# Patient Record
Sex: Female | Born: 1955 | Race: Black or African American | Hispanic: No | Marital: Married | Smoking: Current some day smoker
Health system: Southern US, Community
[De-identification: ages and names within clinical notes are randomized; demographics above are authoritative.]

## PROBLEM LIST (undated history)

## (undated) DIAGNOSIS — C539 Malignant neoplasm of cervix uteri, unspecified: Secondary | ICD-10-CM

## (undated) DIAGNOSIS — I214 Non-ST elevation (NSTEMI) myocardial infarction: Secondary | ICD-10-CM

## (undated) DIAGNOSIS — K922 Gastrointestinal hemorrhage, unspecified: Secondary | ICD-10-CM

## (undated) DIAGNOSIS — I509 Heart failure, unspecified: Secondary | ICD-10-CM

## (undated) DIAGNOSIS — E119 Type 2 diabetes mellitus without complications: Secondary | ICD-10-CM

## (undated) DIAGNOSIS — K259 Gastric ulcer, unspecified as acute or chronic, without hemorrhage or perforation: Secondary | ICD-10-CM

## (undated) DIAGNOSIS — D649 Anemia, unspecified: Secondary | ICD-10-CM

## (undated) DIAGNOSIS — J449 Chronic obstructive pulmonary disease, unspecified: Secondary | ICD-10-CM

## (undated) HISTORY — PX: APPENDECTOMY: SHX54

## (undated) HISTORY — DX: Non-ST elevation (NSTEMI) myocardial infarction: I21.4

## (undated) HISTORY — PX: OTHER SURGICAL HISTORY: SHX169

## (undated) HISTORY — DX: Malignant neoplasm of cervix uteri, unspecified: C53.9

## (undated) HISTORY — PX: CHOLECYSTECTOMY: SHX55

## (undated) HISTORY — PX: CARDIAC CATHETERIZATION: SHX172

---

## 2004-02-29 ENCOUNTER — Emergency Department: Payer: Self-pay | Admitting: General Practice

## 2004-05-11 ENCOUNTER — Emergency Department: Payer: Self-pay | Admitting: Unknown Physician Specialty

## 2004-07-06 ENCOUNTER — Emergency Department: Payer: Self-pay | Admitting: Internal Medicine

## 2004-09-11 ENCOUNTER — Other Ambulatory Visit: Payer: Self-pay

## 2004-09-12 ENCOUNTER — Inpatient Hospital Stay: Payer: Self-pay | Admitting: Infectious Diseases

## 2004-10-08 ENCOUNTER — Inpatient Hospital Stay: Payer: Self-pay | Admitting: Internal Medicine

## 2004-10-08 ENCOUNTER — Other Ambulatory Visit: Payer: Self-pay

## 2004-11-12 ENCOUNTER — Other Ambulatory Visit: Payer: Self-pay

## 2004-11-13 ENCOUNTER — Observation Stay: Payer: Self-pay

## 2004-11-14 ENCOUNTER — Inpatient Hospital Stay: Payer: Self-pay | Admitting: Internal Medicine

## 2004-11-14 ENCOUNTER — Other Ambulatory Visit: Payer: Self-pay

## 2004-11-15 ENCOUNTER — Other Ambulatory Visit: Payer: Self-pay

## 2004-11-20 ENCOUNTER — Emergency Department: Payer: Self-pay | Admitting: Emergency Medicine

## 2004-12-12 ENCOUNTER — Other Ambulatory Visit: Payer: Self-pay

## 2004-12-12 ENCOUNTER — Emergency Department: Payer: Self-pay | Admitting: Emergency Medicine

## 2004-12-23 ENCOUNTER — Other Ambulatory Visit: Payer: Self-pay

## 2004-12-23 ENCOUNTER — Emergency Department: Payer: Self-pay | Admitting: General Practice

## 2005-05-25 ENCOUNTER — Ambulatory Visit: Payer: Self-pay | Admitting: Gastroenterology

## 2005-06-01 ENCOUNTER — Other Ambulatory Visit: Payer: Self-pay

## 2005-06-02 ENCOUNTER — Inpatient Hospital Stay: Payer: Self-pay | Admitting: Internal Medicine

## 2005-06-02 ENCOUNTER — Other Ambulatory Visit: Payer: Self-pay

## 2005-06-03 ENCOUNTER — Other Ambulatory Visit: Payer: Self-pay

## 2005-06-20 ENCOUNTER — Ambulatory Visit: Payer: Self-pay | Admitting: Surgery

## 2005-07-27 ENCOUNTER — Emergency Department: Payer: Self-pay | Admitting: Emergency Medicine

## 2005-10-06 IMAGING — CR DG CHEST 1V PORT
1 series · 1 of 1 positions shown · non-contrast
Comparison: none

REASON FOR EXAM: CP
COMMENTS:

[view not recorded]
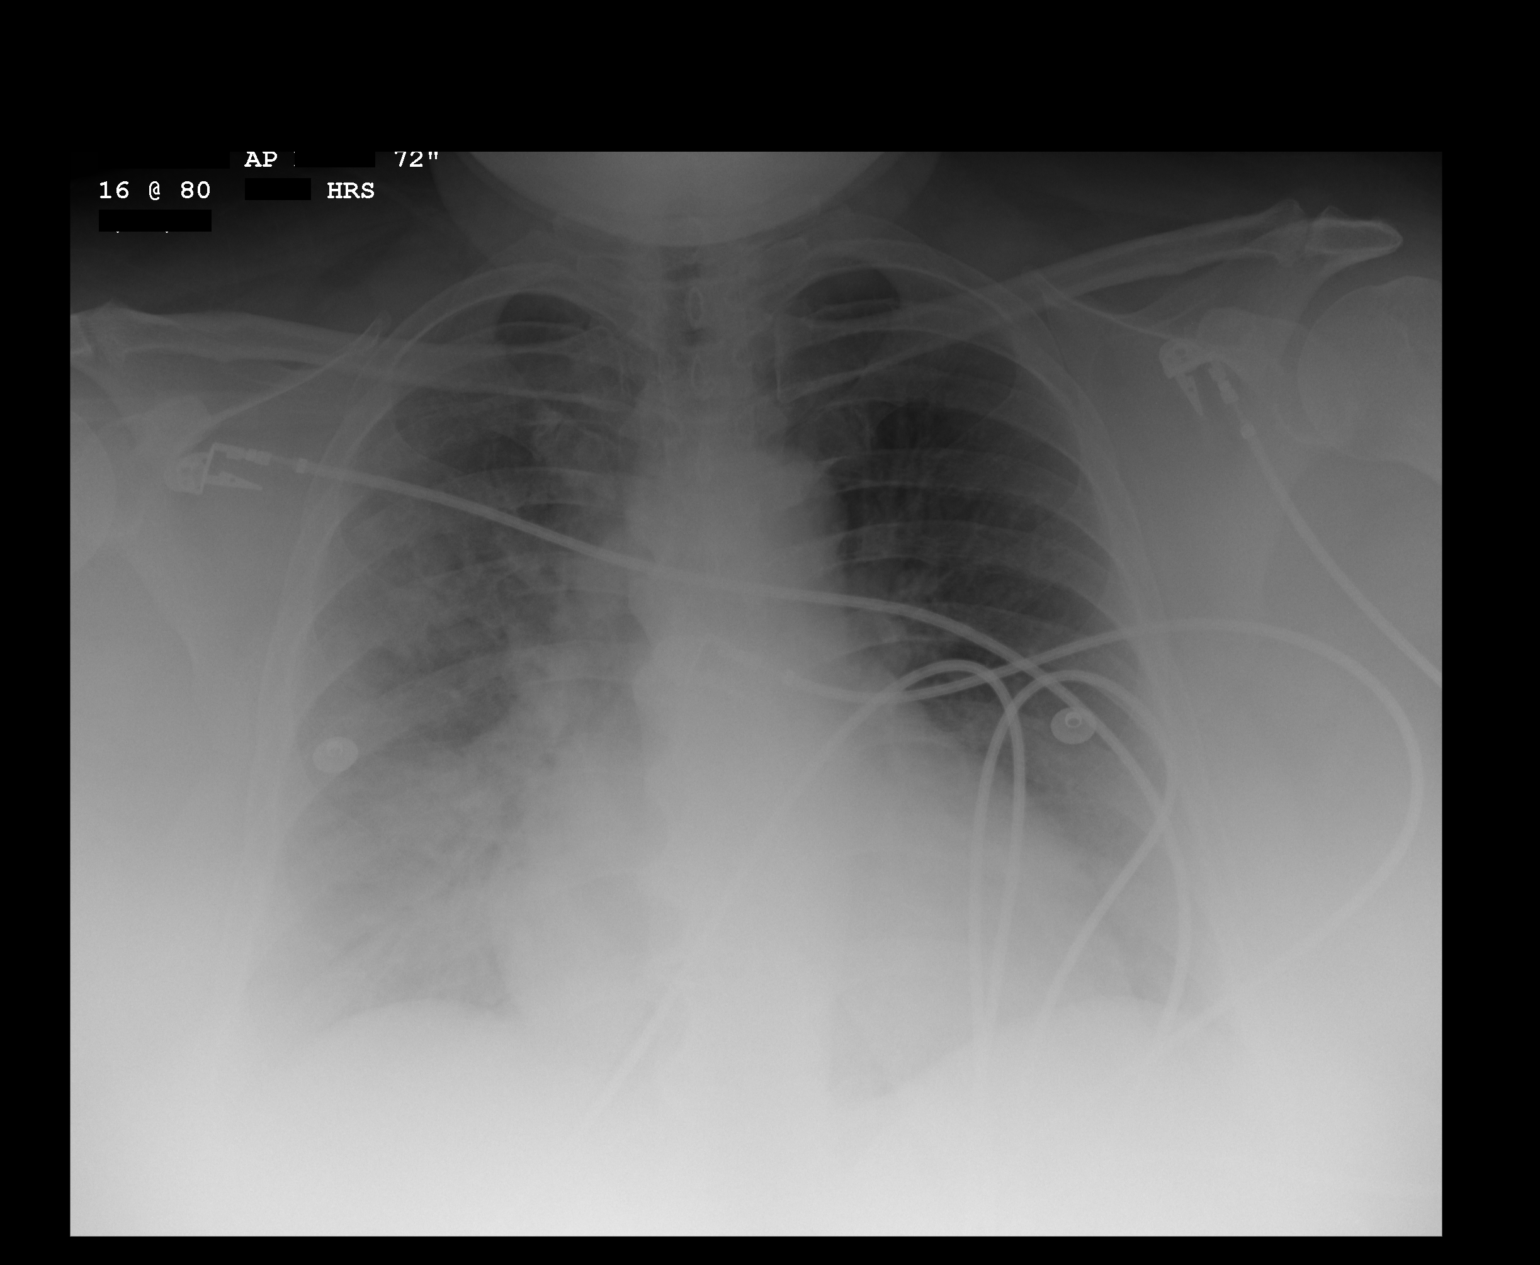

[1 of 1 positions shown; findings below may reference images not displayed]

PROCEDURE:     DXR - DXR PORTABLE CHEST SINGLE VIEW  - September 11, 2004  [DATE]

RESULT:          There are increased interstitial densities noted throughout
much of the RIGHT lung.  The cardiopericardial silhouette is enlarged.  The
LEFT lung is adequately inflated and grossly clear.  Mild prominence of the
central pulmonary vascularity is present.
IMPRESSION: 1.     There are interstitial infiltrates noted within the RIGHT lung
relatively diffusely.
2.     There are findings consistent with low-grade congestive heart
failure.  Enlargement of the cardiac silhouette is present.

## 2005-10-06 IMAGING — CT CT CHEST W/ CM
1 series · 15 of 33 positions shown, 19 images · IV contrast (APPLIED)
Comparison: none

REASON FOR EXAM: Chest pain
COMMENTS:

[Series 5: soft tissue · axial · 0.78mm/px · z∈[-790,-532]mm · 15 of 102 slices shown, 19 images]
[im 8/102  mediastinal]
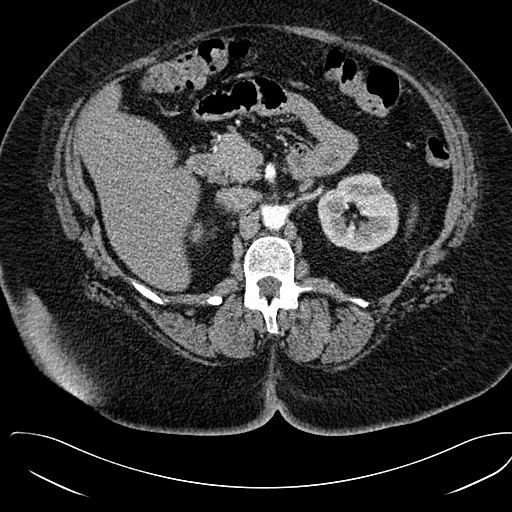
[im 8/102  lung]
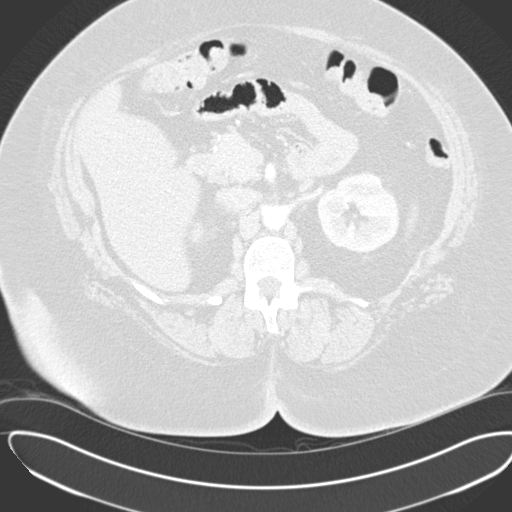
[im 15/102  lung]
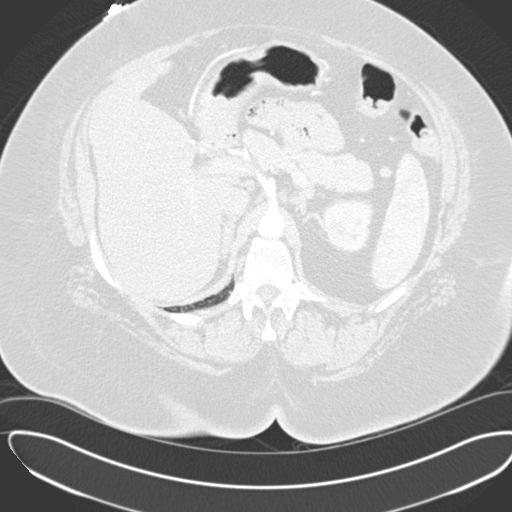
[im 21/102  lung]
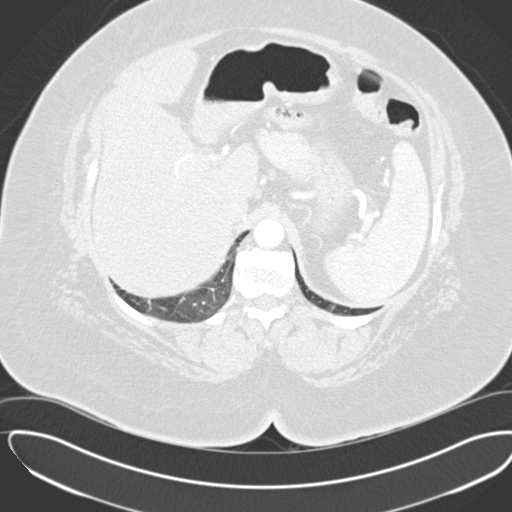
[im 27/102  lung]
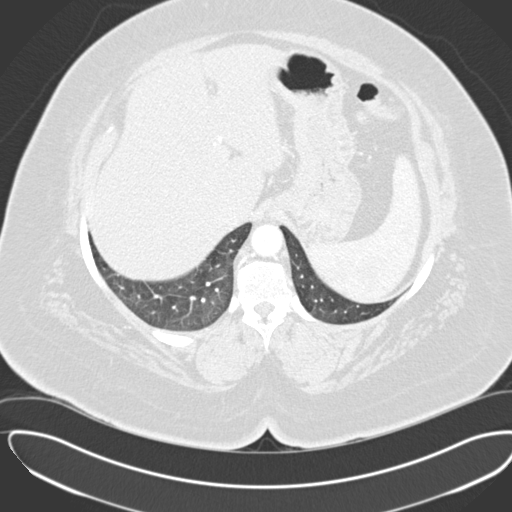
[im 34/102  mediastinal]
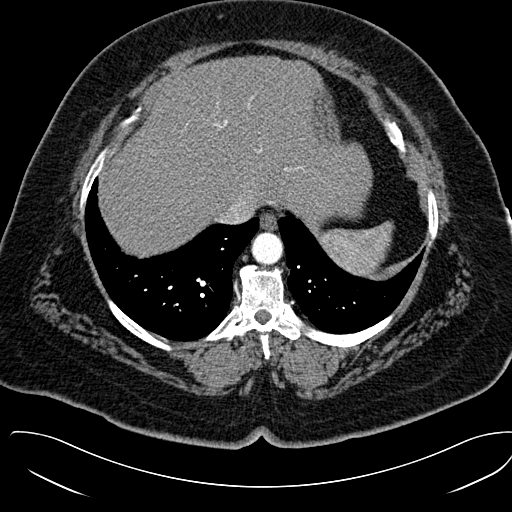
[im 34/102  lung]
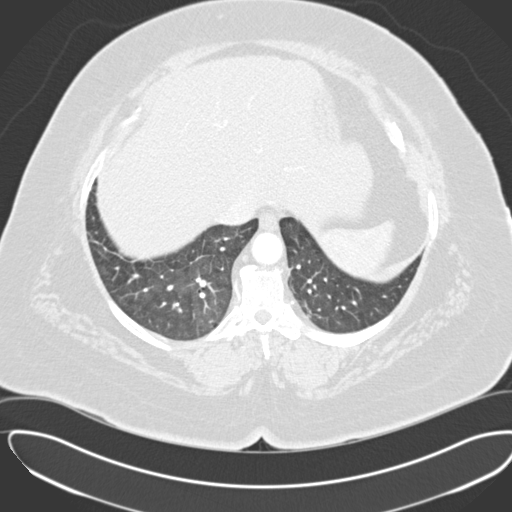
[im 41/102  lung]
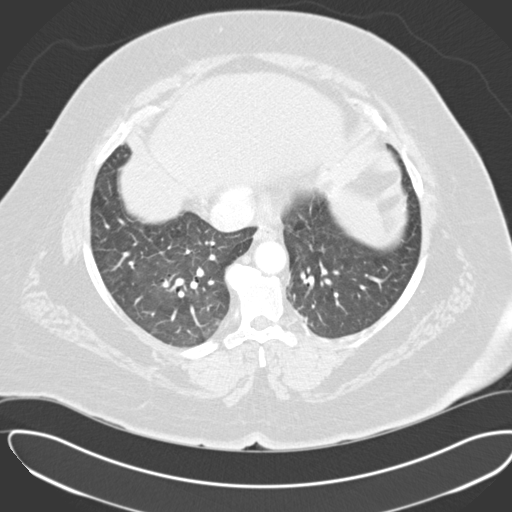
[im 45/102  lung]
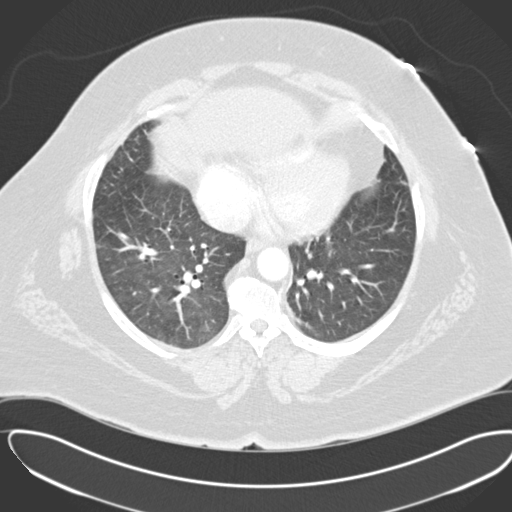
[im 53/102  lung]
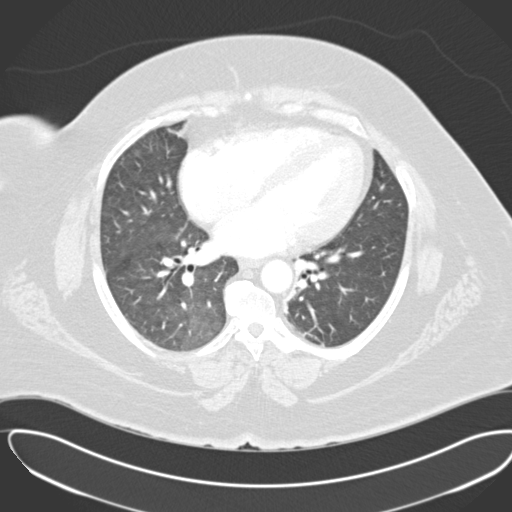
[im 57/102  mediastinal]
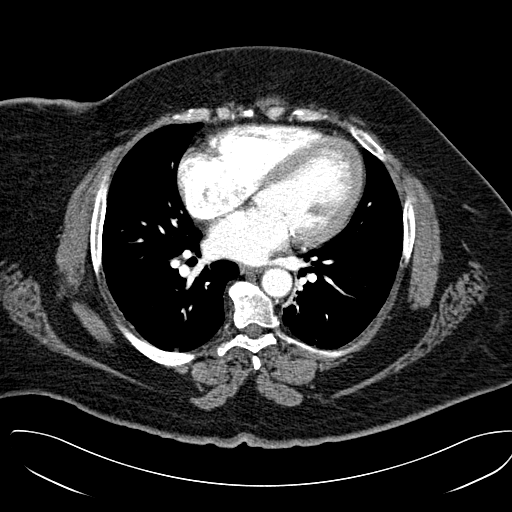
[im 57/102  lung]
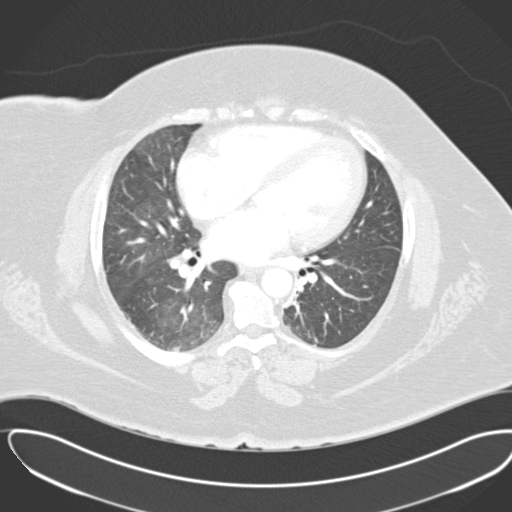
[im 61/102  lung]
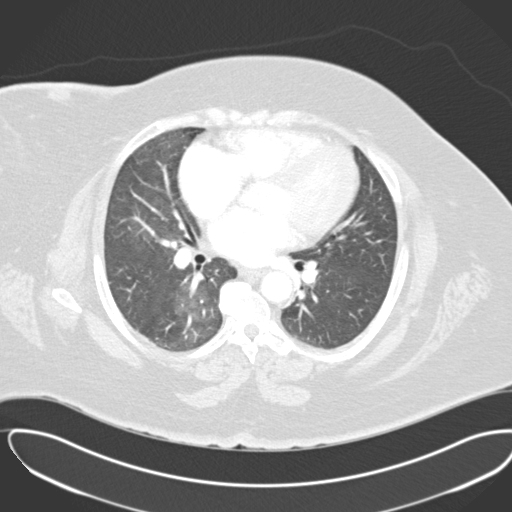
[im 68/102  lung]
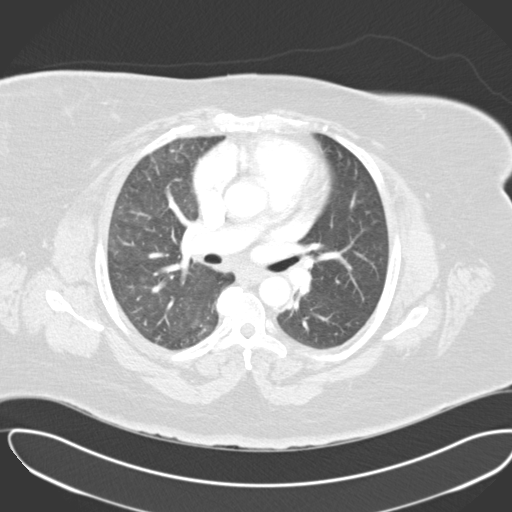
[im 75/102  lung]
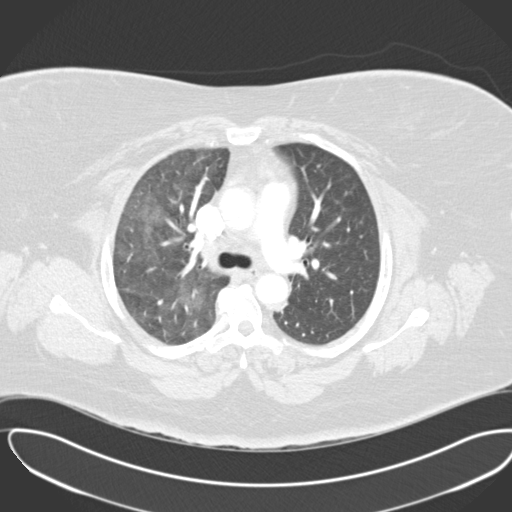
[im 81/102  mediastinal]
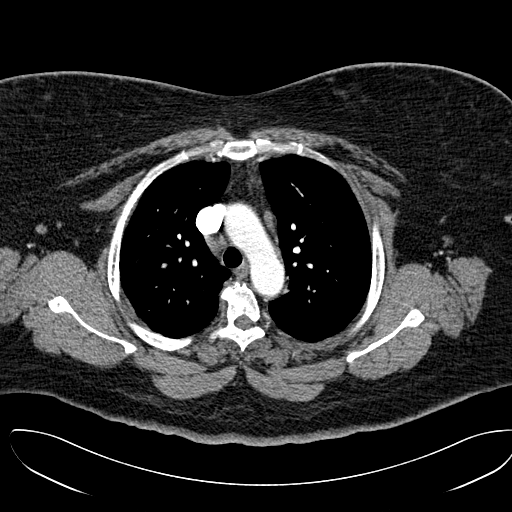
[im 81/102  lung]
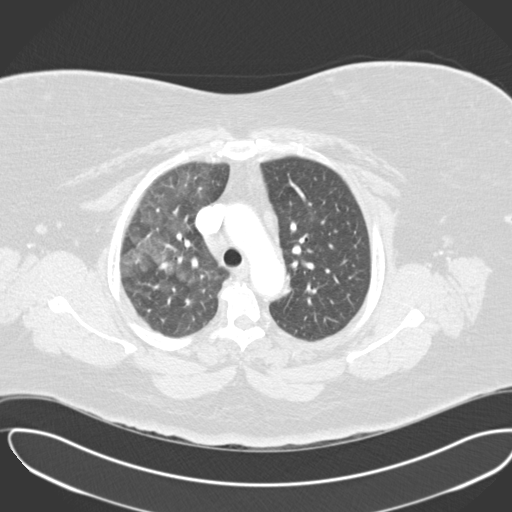
[im 87/102  lung]
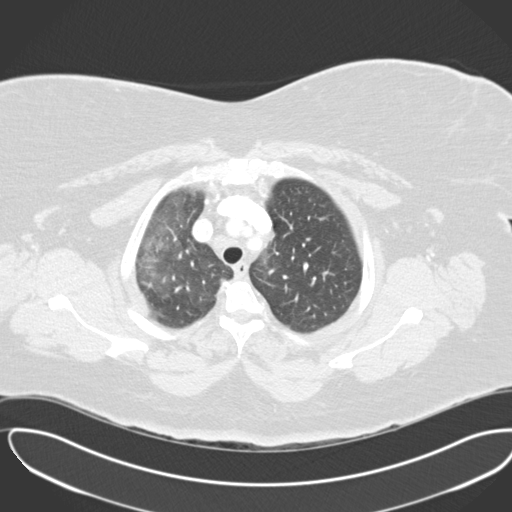
[im 94/102  lung]
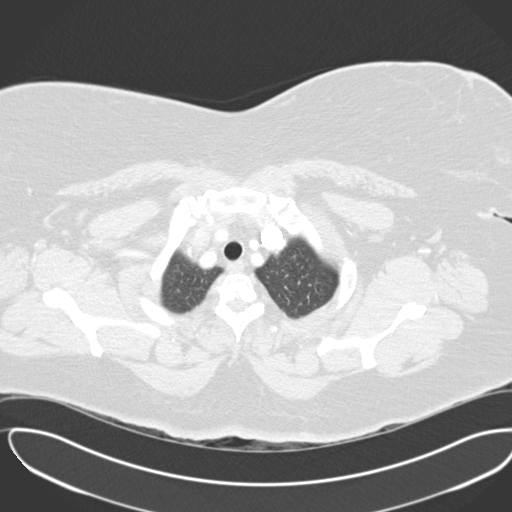

[15 of 33 positions shown; findings below may reference images not displayed]

PROCEDURE:     CT  - CT CHEST (FOR PE) W  - September 11, 2004 [DATE]

RESULT:          The patient has elevated white blood cell count as well as
D-dimer and is having bilateral shoulder pain and dyspnea.  Comparison is
made to a chest x-ray 11 September, 2004.  The patient received 100 cc of
Psovue-MME for this study.

The caliber of the thoracic aorta is normal.  Contrast within the pulmonary
arterial tree is also normal.  I see no pleural nor pericardial effusion.
The cardiac chambers are mildly enlarged.  No pathologic-size mediastinal or
hilar lymph nodes are seen.  At lung window settings, increased interstitial
density in a ground glass pattern is seen in the RIGHT upper lobe as well as
in the RIGHT lower lobe superiorly.  The RIGHT middle lobe appears only
minimally involved.  I do not see similar ground glass appearance in the
LEFT lung.  No abnormal parenchymal nodules are seen within either lung.

Within the upper abdomen, the observed portions of the liver are normal.
The spleen is not enlarged.
IMPRESSION: 1.     I do not see evidence of an acute pulmonary embolism.
2.     There is mild enlargement of the cardiac chambers.  There is no
pleural or pericardial effusion.  The caliber of the thoracic aorta is
normal.
3.     There is mildly increased interstitial density throughout much of the
RIGHT lung.  This may reflect developing interstitial type pneumonia.
Followup chest x-rays are recommended.

A preliminary report was sent to the emergency room at the conclusion of the
study.

## 2005-10-14 ENCOUNTER — Observation Stay: Payer: Self-pay | Admitting: Internal Medicine

## 2005-10-14 ENCOUNTER — Other Ambulatory Visit: Payer: Self-pay

## 2005-10-18 ENCOUNTER — Inpatient Hospital Stay: Payer: Self-pay | Admitting: Internal Medicine

## 2005-10-18 ENCOUNTER — Other Ambulatory Visit: Payer: Self-pay

## 2005-11-02 IMAGING — CR DG CHEST 1V PORT
1 series · 1 of 1 positions shown · non-contrast
Comparison: none

REASON FOR EXAM: chest pain       rm 2
COMMENTS:

[view not recorded]
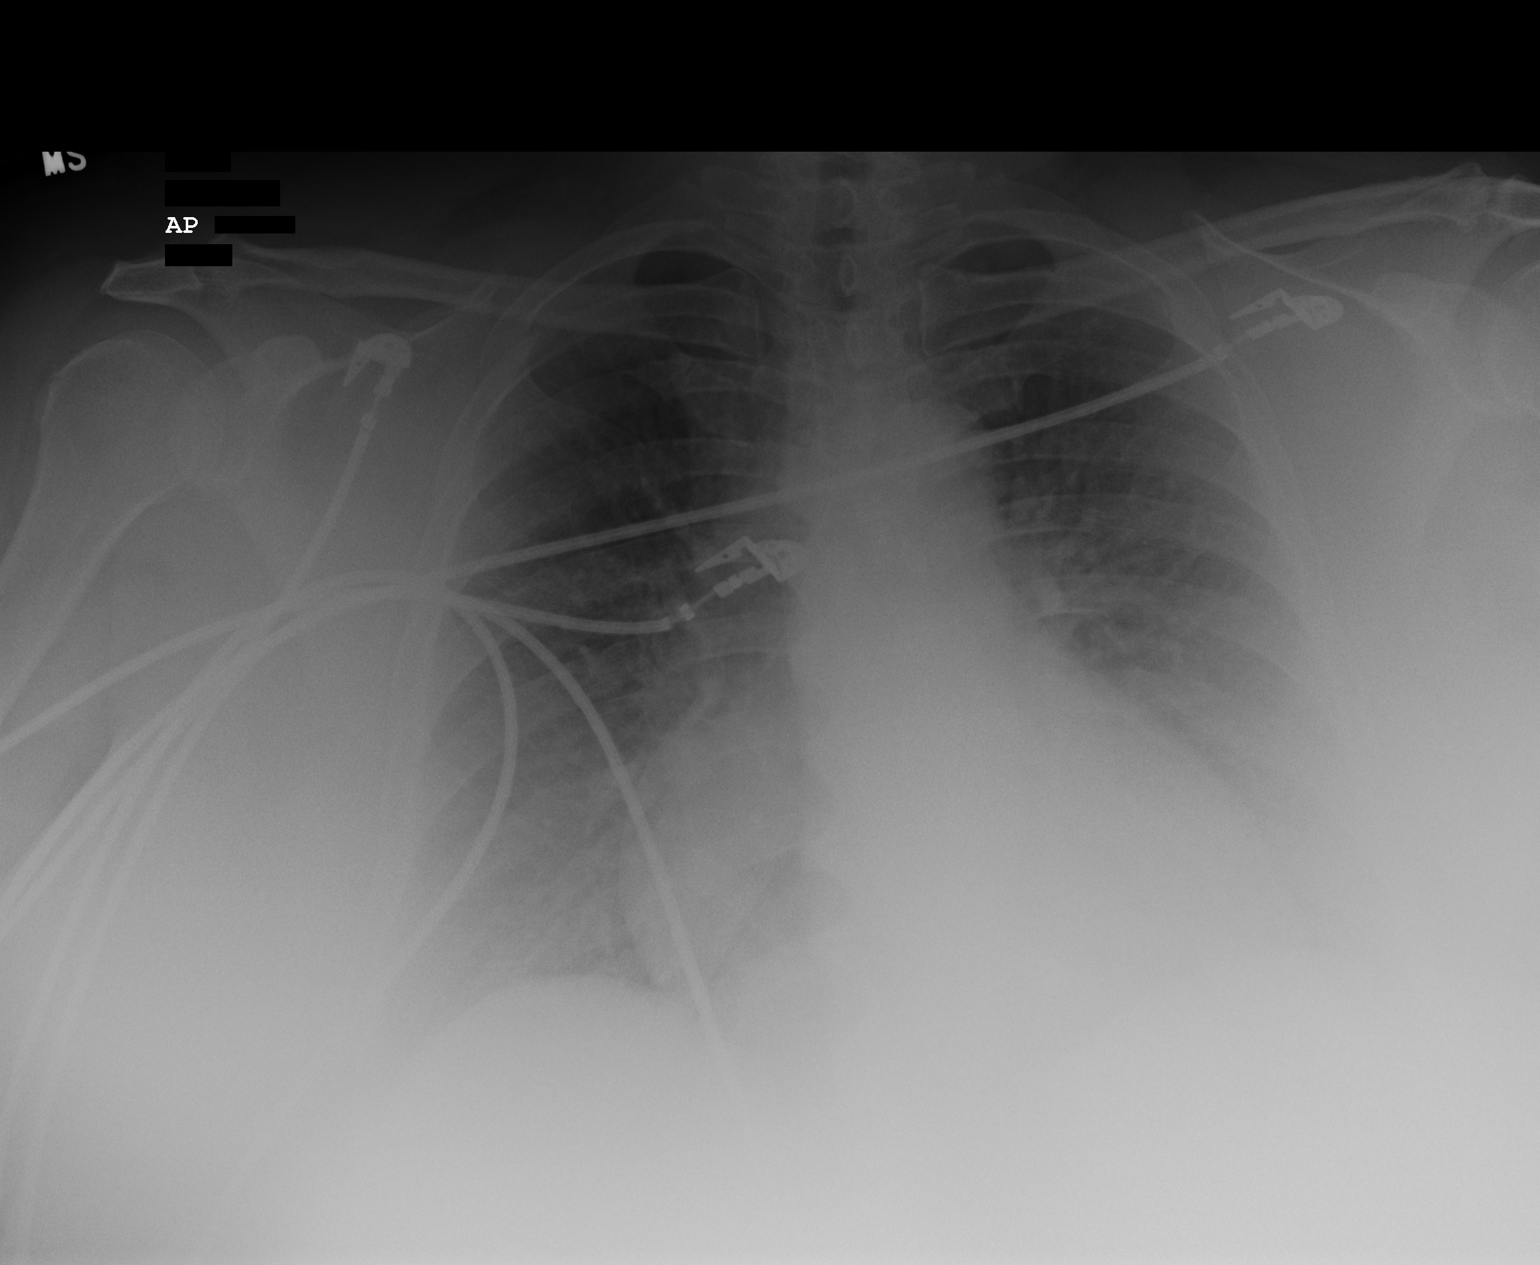

[1 of 1 positions shown; findings below may reference images not displayed]

PROCEDURE:     DXR - DXR PORTABLE CHEST SINGLE VIEW  - October 08, 2004  [DATE]

RESULT:     AP view of the chest is compared to the prior exam of 09-11-04.
The current exam shows slight prominence of the pulmonary vascularity
compatible with pulmonary vascular congestion. No definite interstitial or
pulmonary edema is seen radiographically at this time. The changes of
congestive heart failure evident on the exam of 09-11-04 are now less
prominent.  The heart is mildly enlarged, but stable as compared to the
prior exam. No pneumonia is seen. Monitoring electrodes are present.
IMPRESSION: 1)There are changes of pulmonary vascular congestion consistent with early
manifestation of congestive heart failure.  The findings of CHF are less
prominent than those observed on the prior exam of 09-11-04.

[DATE])No pneumonia is seen.

## 2005-11-08 ENCOUNTER — Other Ambulatory Visit: Payer: Self-pay

## 2005-11-08 ENCOUNTER — Emergency Department: Payer: Self-pay | Admitting: General Practice

## 2005-12-07 IMAGING — CR DG CHEST 1V PORT
1 series · 1 of 1 positions shown · non-contrast
Comparison: none

REASON FOR EXAM: Shortness of breath
COMMENTS:

[view not recorded]
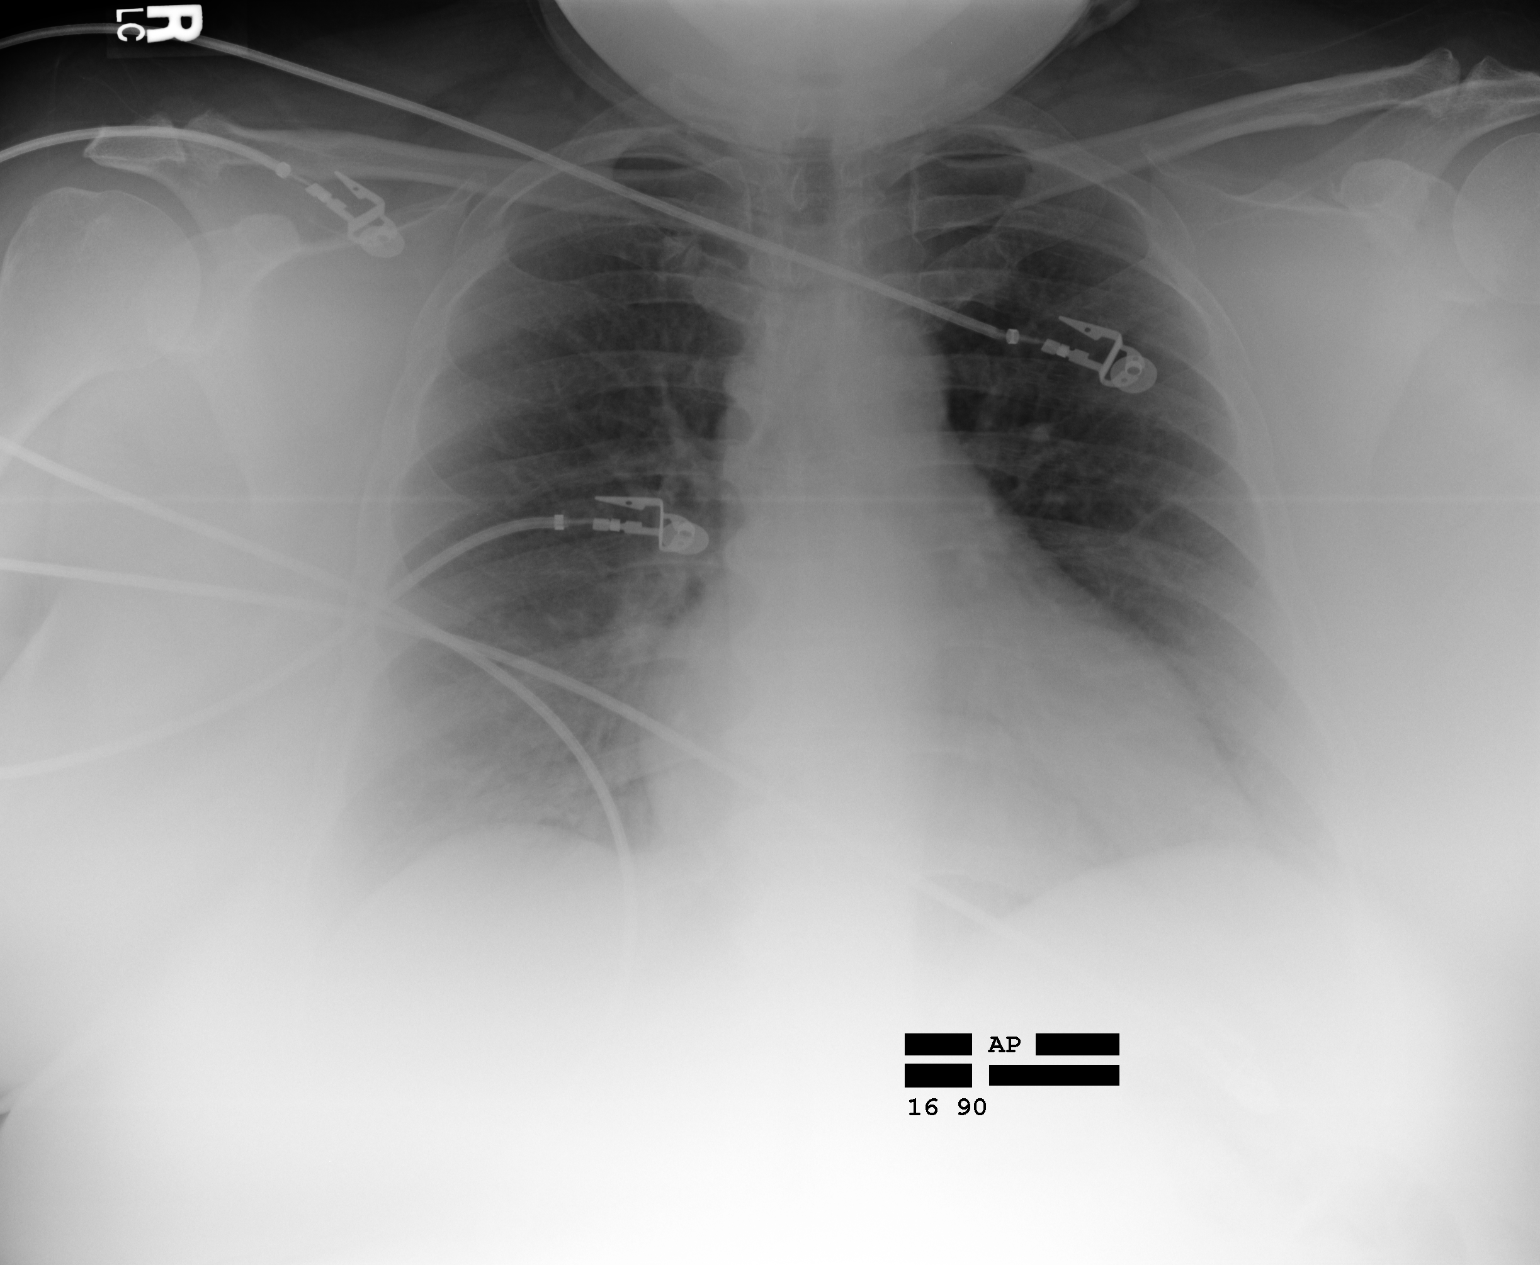

[1 of 1 positions shown; findings below may reference images not displayed]

PROCEDURE:     DXR - DXR PORTABLE CHEST SINGLE VIEW  - November 12, 2004  [DATE]

RESULT:     AP portable exam was obtained and compared to prior study of
10/08/2004.

Mild cardiomegaly is seen.  The lung fields are clear.  No obvious
congestive failure is noted on today's exam.  No effusions are seen.
IMPRESSION: 1.     Mild cardiomegaly.
2.     The lung fields are clear.

## 2005-12-07 IMAGING — CT CT CHEST W/ CM
1 series · 16 of 34 positions shown, 20 images · IV contrast (APPLIED)
Comparison: none

REASON FOR EXAM: Shortness of breath
COMMENTS:

[Series 4: soft tissue · axial · 0.69mm/px · z∈[-314,-59]mm · 16 of 97 slices shown, 20 images]
[im 8/97  mediastinal]
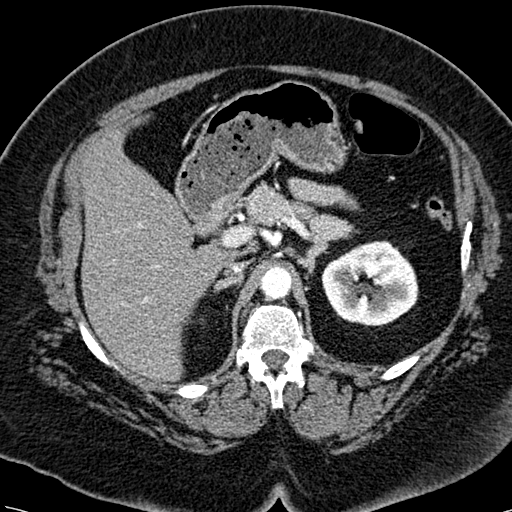
[im 8/97  lung]
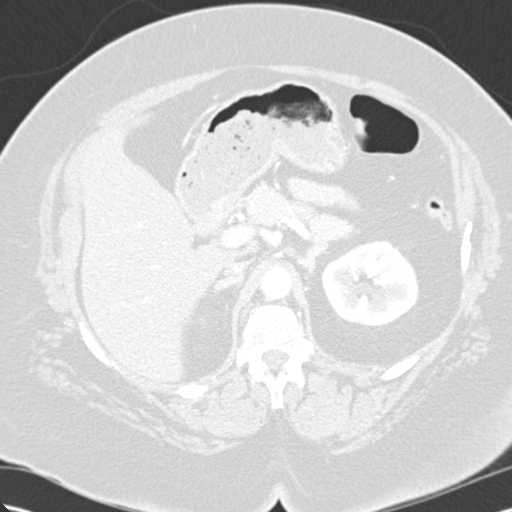
[im 15/97  lung]
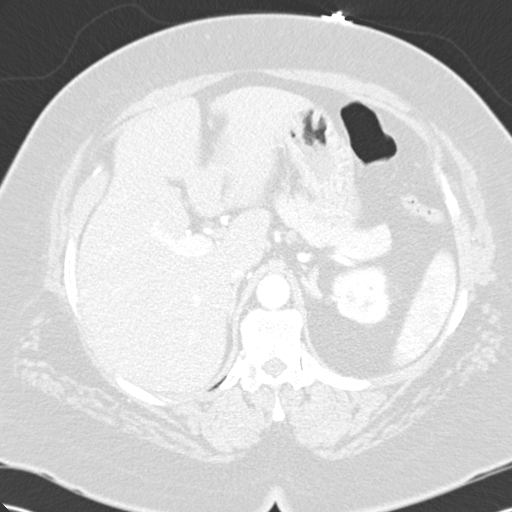
[im 20/97  lung]
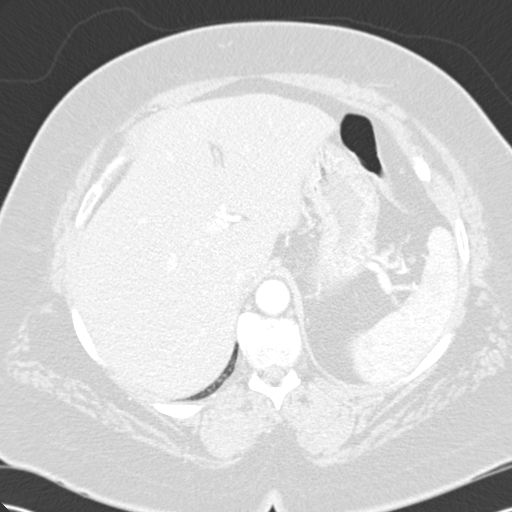
[im 25/97  lung]
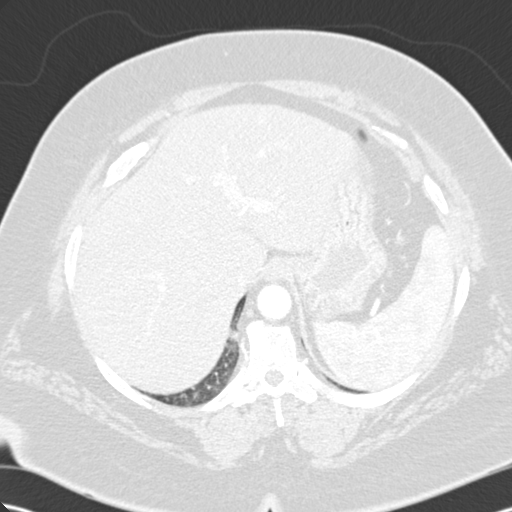
[im 33/97  mediastinal]
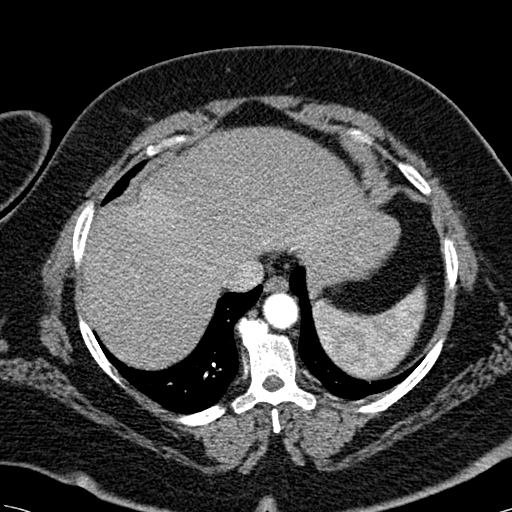
[im 33/97  lung]
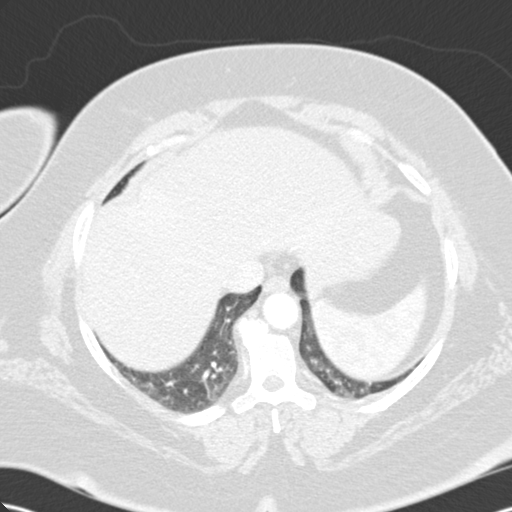
[im 39/97  lung]
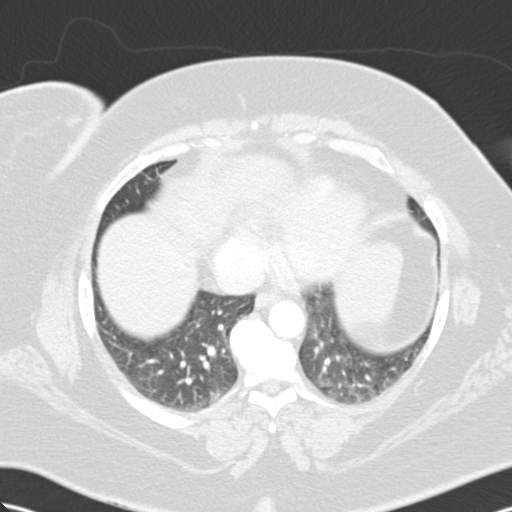
[im 43/97  lung]
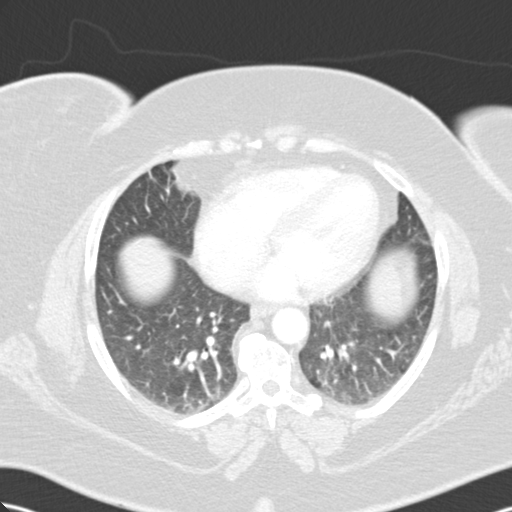
[im 47/97  lung]
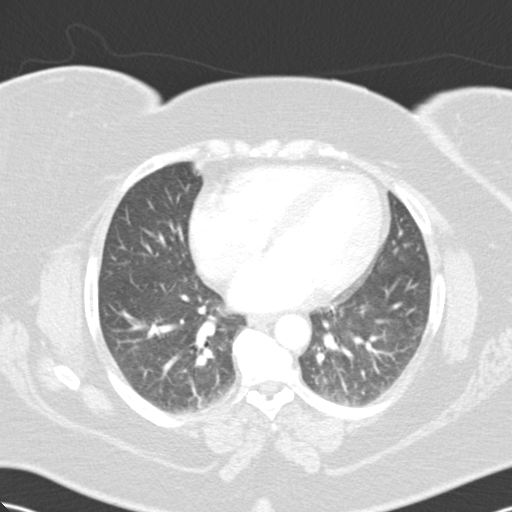
[im 52/97  mediastinal]
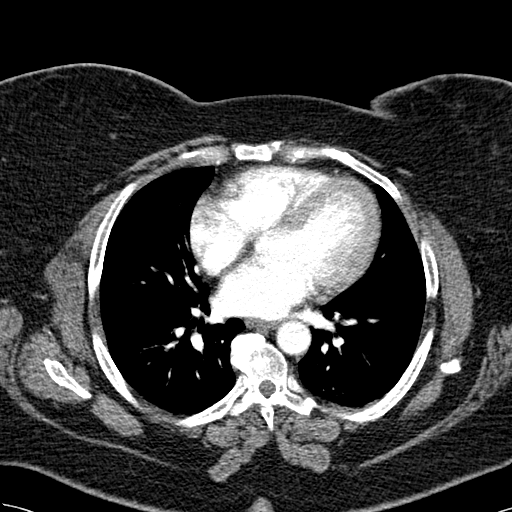
[im 52/97  lung]
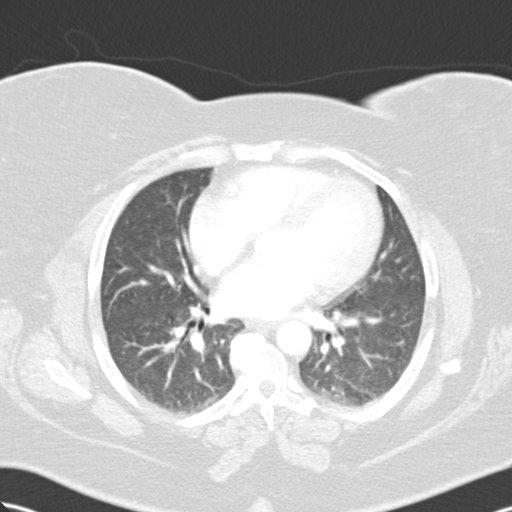
[im 57/97  lung]
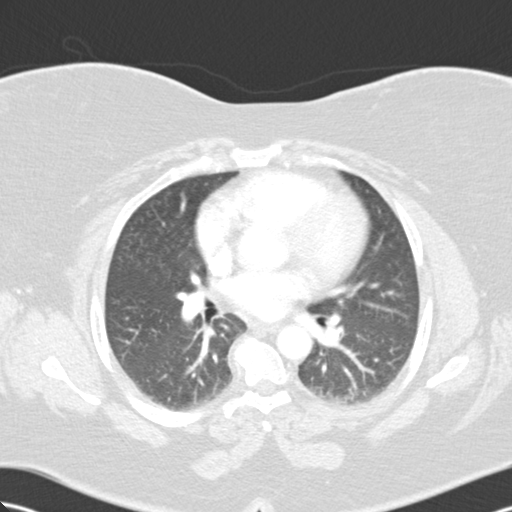
[im 61/97  lung]
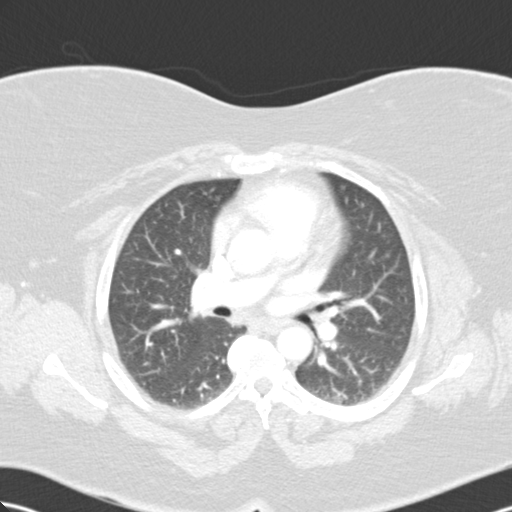
[im 68/97  lung]
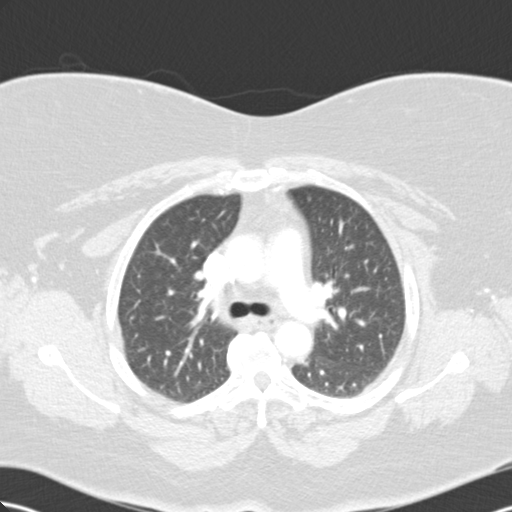
[im 75/97  mediastinal]
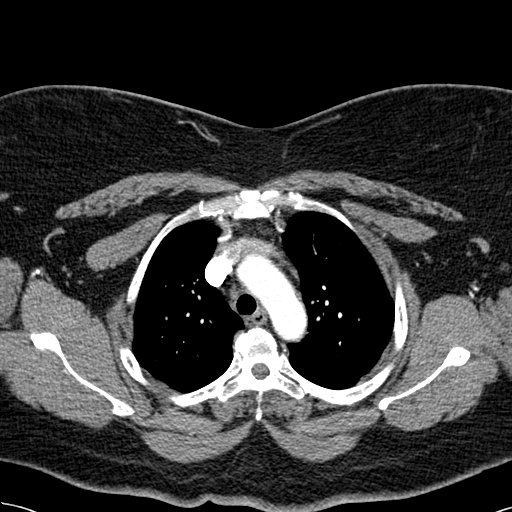
[im 75/97  lung]
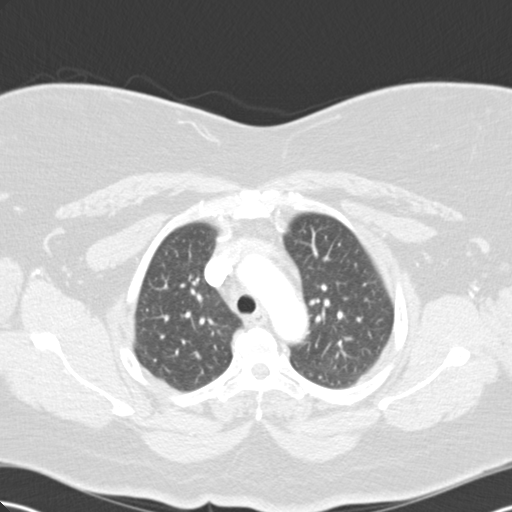
[im 79/97  lung]
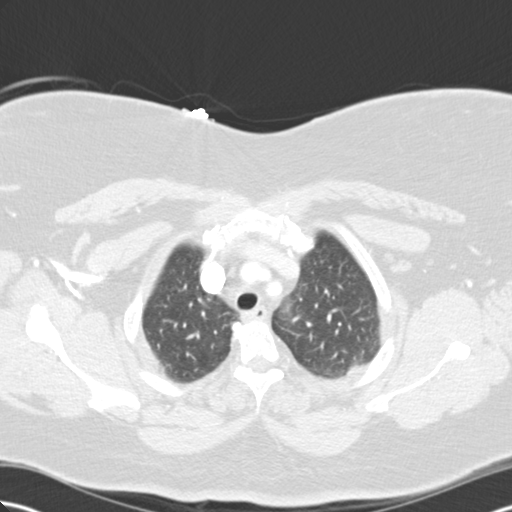
[im 86/97  lung]
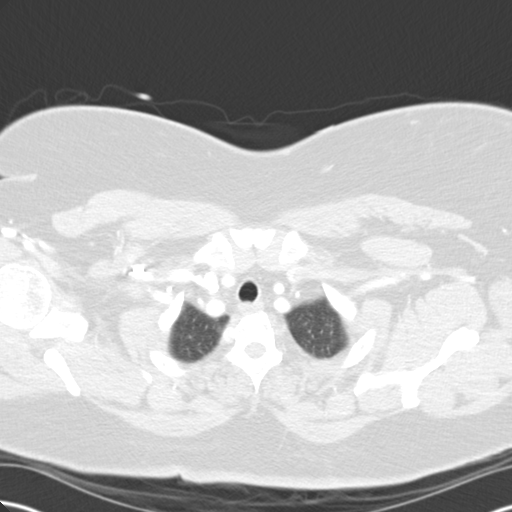
[im 93/97  lung]
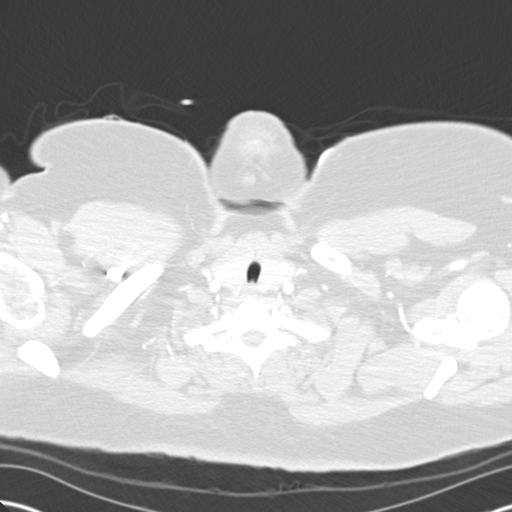

[16 of 34 positions shown; findings below may reference images not displayed]

PROCEDURE:     CT  - CT CHEST (FOR PE) W  - November 12, 2004  [DATE]

RESULT:          There is noted a good bolus of contrast within the
pulmonary arteries.  No filling defects are identified in the visualized
portion of the pulmonary arteries to suggest pulmonary embolus.  On
mediastinal window settings, no mediastinal nodules are identified.  On the
lung window settings, the lung fields appear clear.  No effusions are noted.
IMPRESSION: No evidence of pulmonary embolus identified.  The exam
was originally read by the [HOSPITAL].

## 2005-12-09 IMAGING — CR DG CHEST 2V
1 series · 2 of 2 positions shown · non-contrast
Comparison: none

REASON FOR EXAM: Chest pain
COMMENTS:

[Series 1: view not recorded · 0.17mm/px · 2 of 2 slices shown]
[im 1/2]
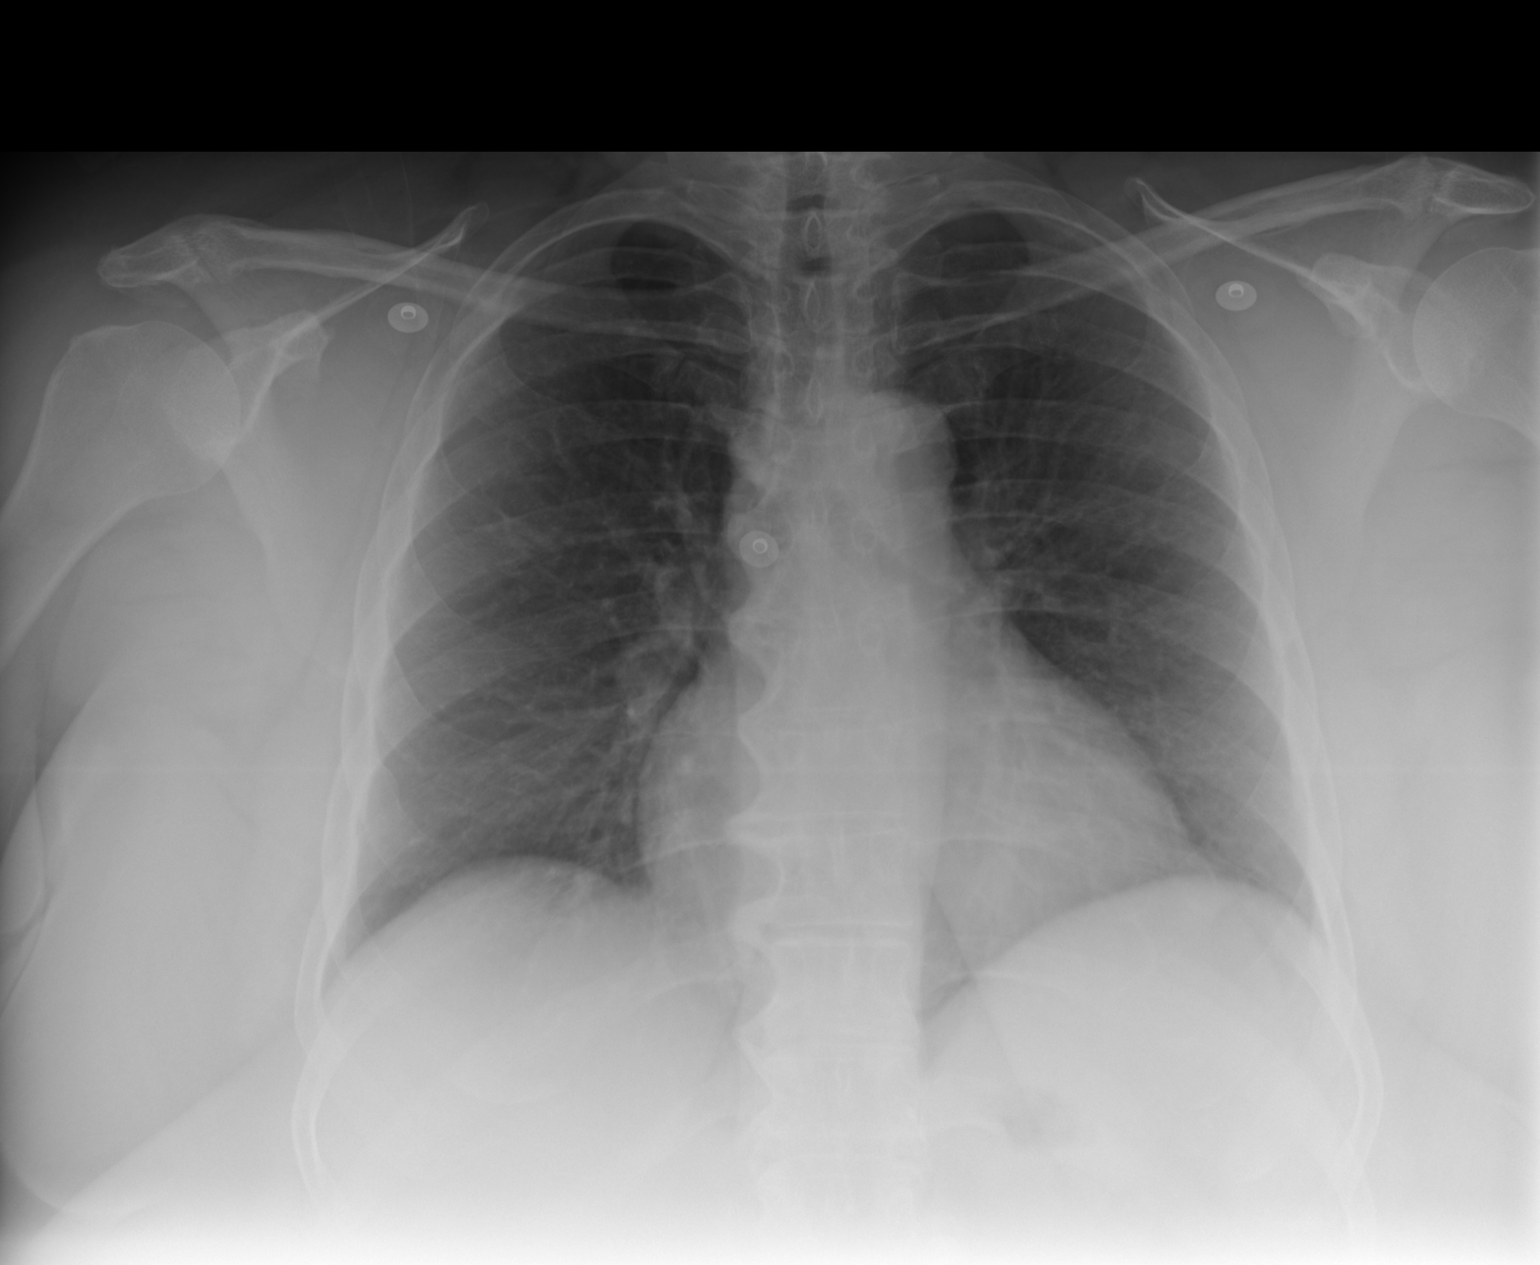
[im 2/2]
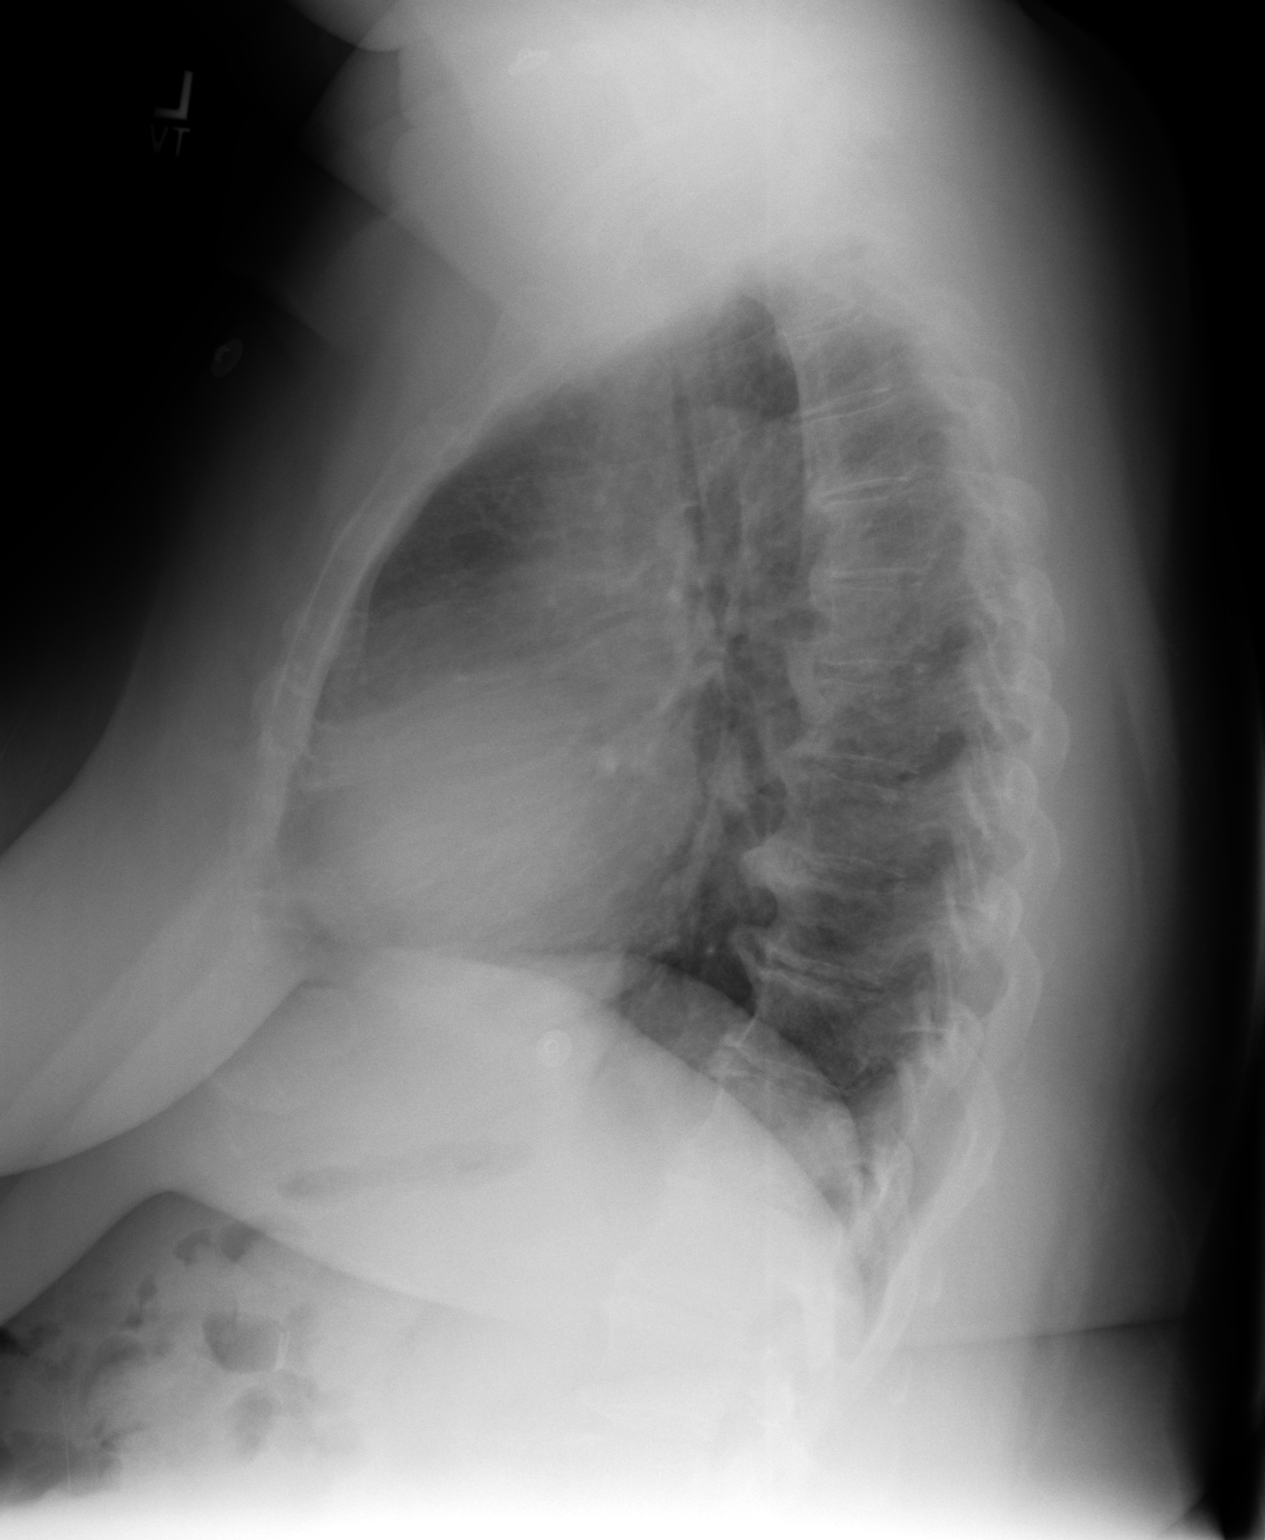

[2 of 2 positions shown; findings below may reference images not displayed]

PROCEDURE:     DXR - DXR CHEST PA (OR AP) AND LATERAL  - November 14, 2004  [DATE]

RESULT:          PA and lateral views were obtained and compared to a prior
AP portable of 11/12/2004.

The heart is within normal limits in size.  The lung fields are clear.  The
vascularity is within normal limits.  No effusions are seen.  There are
noted degenerative spurs in the thoracic spine.
IMPRESSION: The lung fields are clear.

## 2005-12-30 ENCOUNTER — Other Ambulatory Visit: Payer: Self-pay

## 2005-12-30 ENCOUNTER — Inpatient Hospital Stay: Payer: Self-pay | Admitting: Internal Medicine

## 2006-01-06 IMAGING — CR DG CHEST 1V PORT
1 series · 1 of 1 positions shown · non-contrast
Comparison: none

REASON FOR EXAM: Chest pain
COMMENTS:  LMP: Post-Menopausal

PROCEDURE:     DXR - DXR PORTABLE CHEST SINGLE VIEW  - December 12, 2004 [DATE]
RESULT:     A single portable exam is obtained and compared to the prior
exam of 11/14/04.  Mild cardiomegaly is noted.  Lung fields are clear.
Vascularity is within normal limits with no effusions.

[view not recorded]
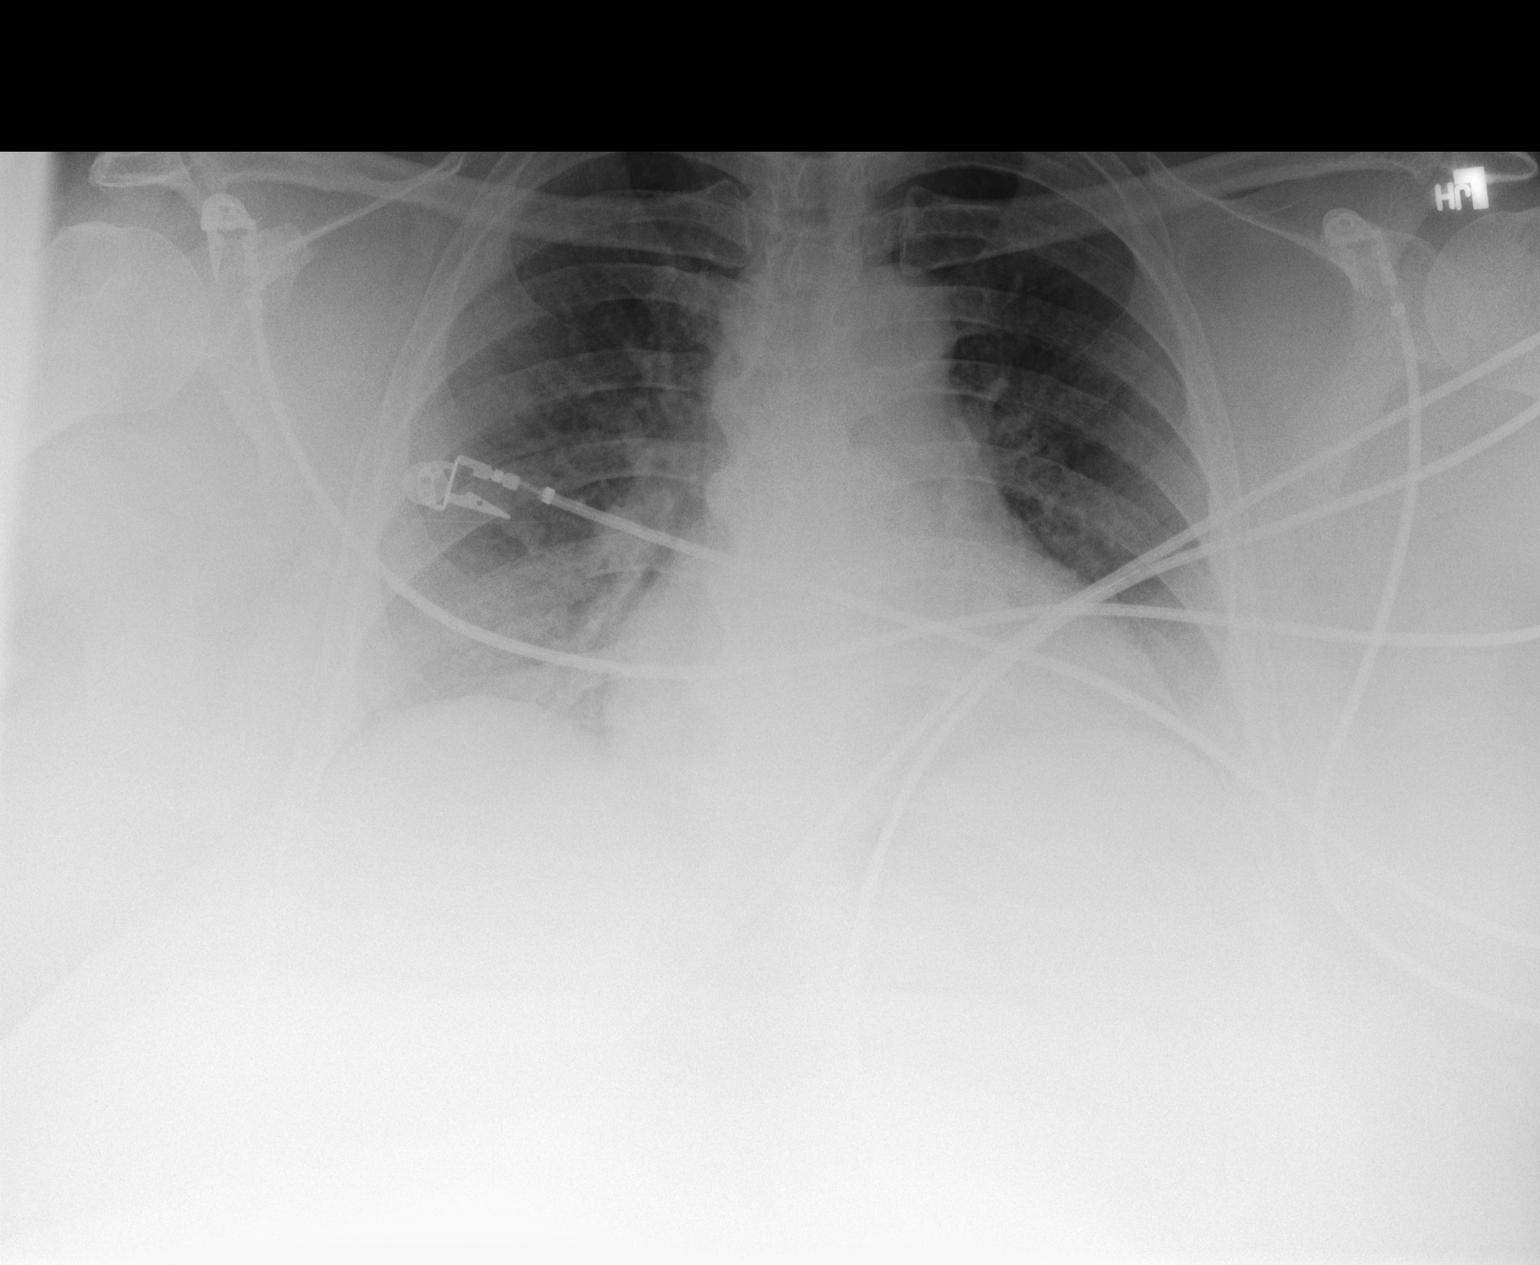

[1 of 1 positions shown; findings below may reference images not displayed]

IMPRESSION: Cardiomegaly.

Lung fields are clear.

## 2006-01-17 IMAGING — CR DG CHEST 1V PORT
1 series · 1 of 1 positions shown · non-contrast
Comparison: none

REASON FOR EXAM: Chest pain rm 2
COMMENTS:

[view not recorded]
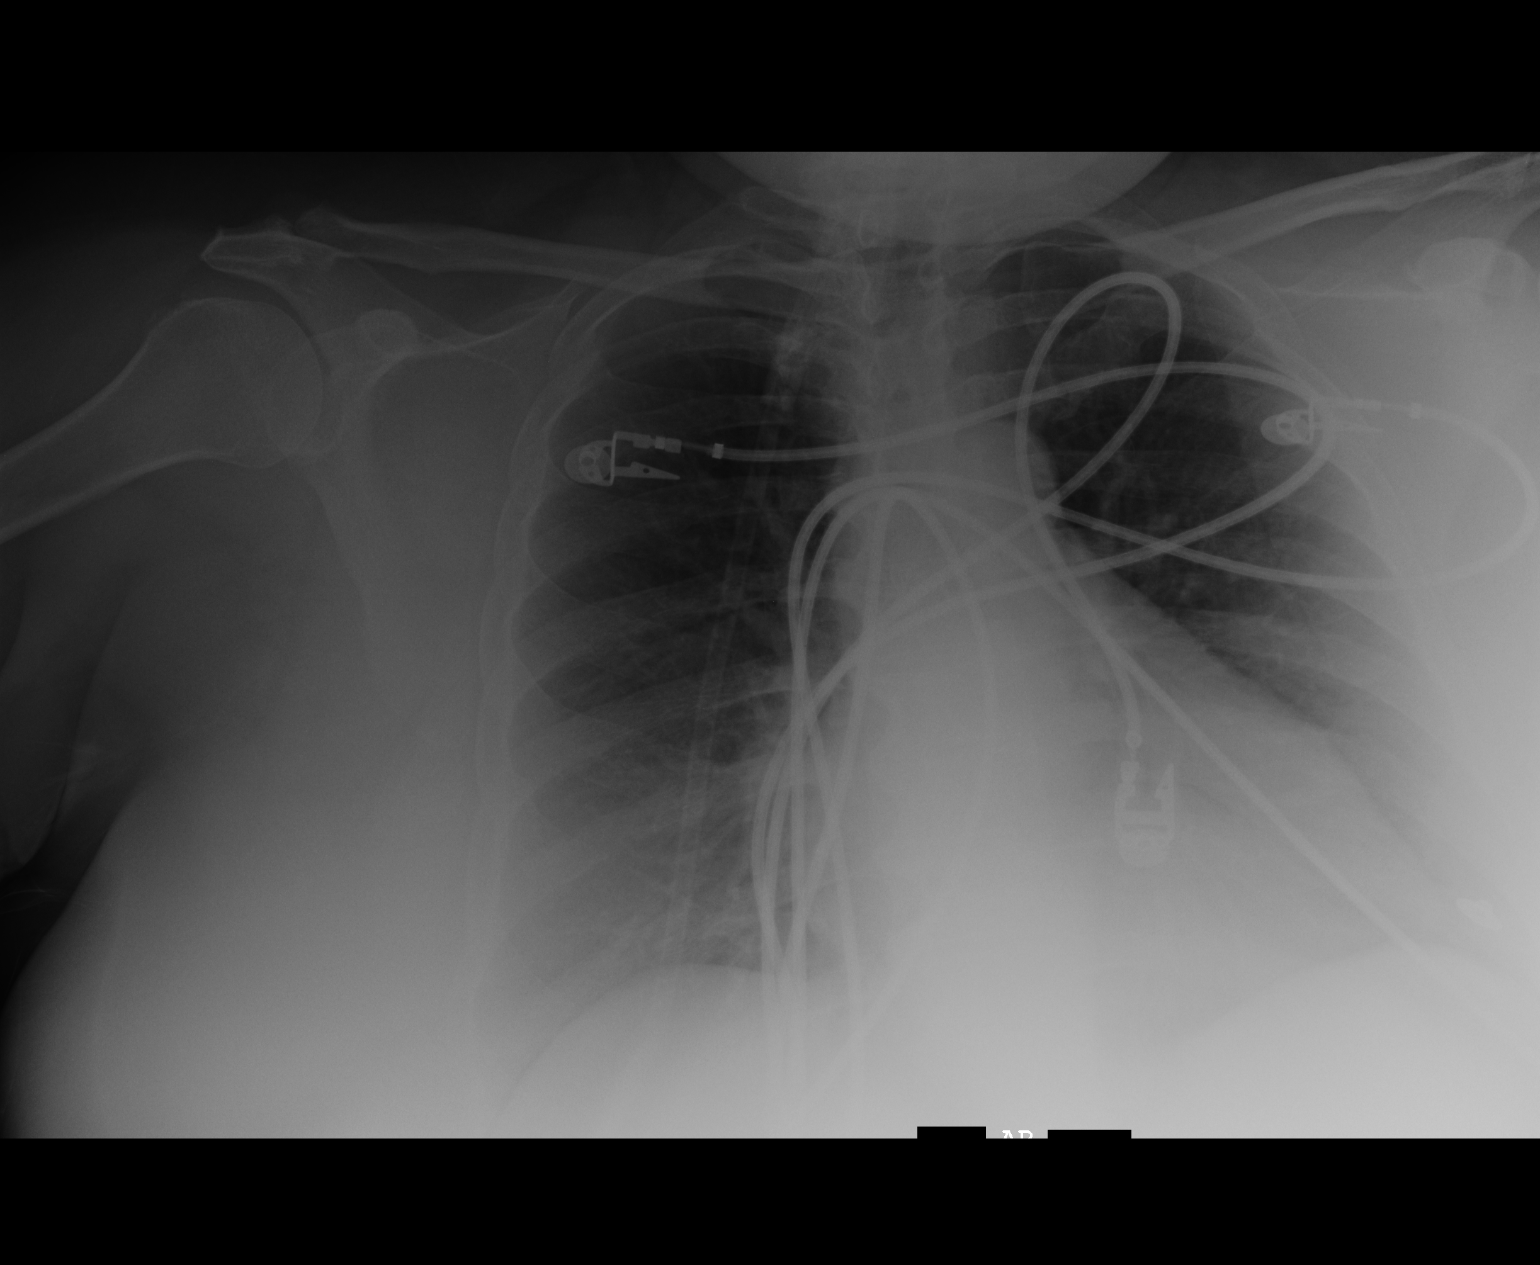

[1 of 1 positions shown; findings below may reference images not displayed]

PROCEDURE:     DXR - DXR PORTABLE CHEST SINGLE VIEW  - December 23, 2004  [DATE]

RESULT:     AP view of the chest is compared to the prior exam of 12/12/04.
The lung fields remain clear.  No pneumonia, pneumothorax, or pleural
effusion is seen.  The heart is upper limits for normal in size or mildly
enlarged but stable as compared to the prior exam.  Monitoring electrodes
are present.
IMPRESSION: No acute changes are identified.

## 2006-01-23 ENCOUNTER — Emergency Department: Payer: Self-pay | Admitting: Emergency Medicine

## 2006-05-23 ENCOUNTER — Emergency Department: Payer: Self-pay | Admitting: Emergency Medicine

## 2006-06-26 IMAGING — CR DG CHEST 1V PORT
1 series · 1 of 1 positions shown · non-contrast
Comparison: none

REASON FOR EXAM: Shortness of breath, chest pain
COMMENTS:

PROCEDURE:     DXR - DXR PORTABLE CHEST SINGLE VIEW  - June 01, 2005 [DATE]
RESULT:     Lungs are clear.  Cardiovascular structures are unremarkable.

[view not recorded]
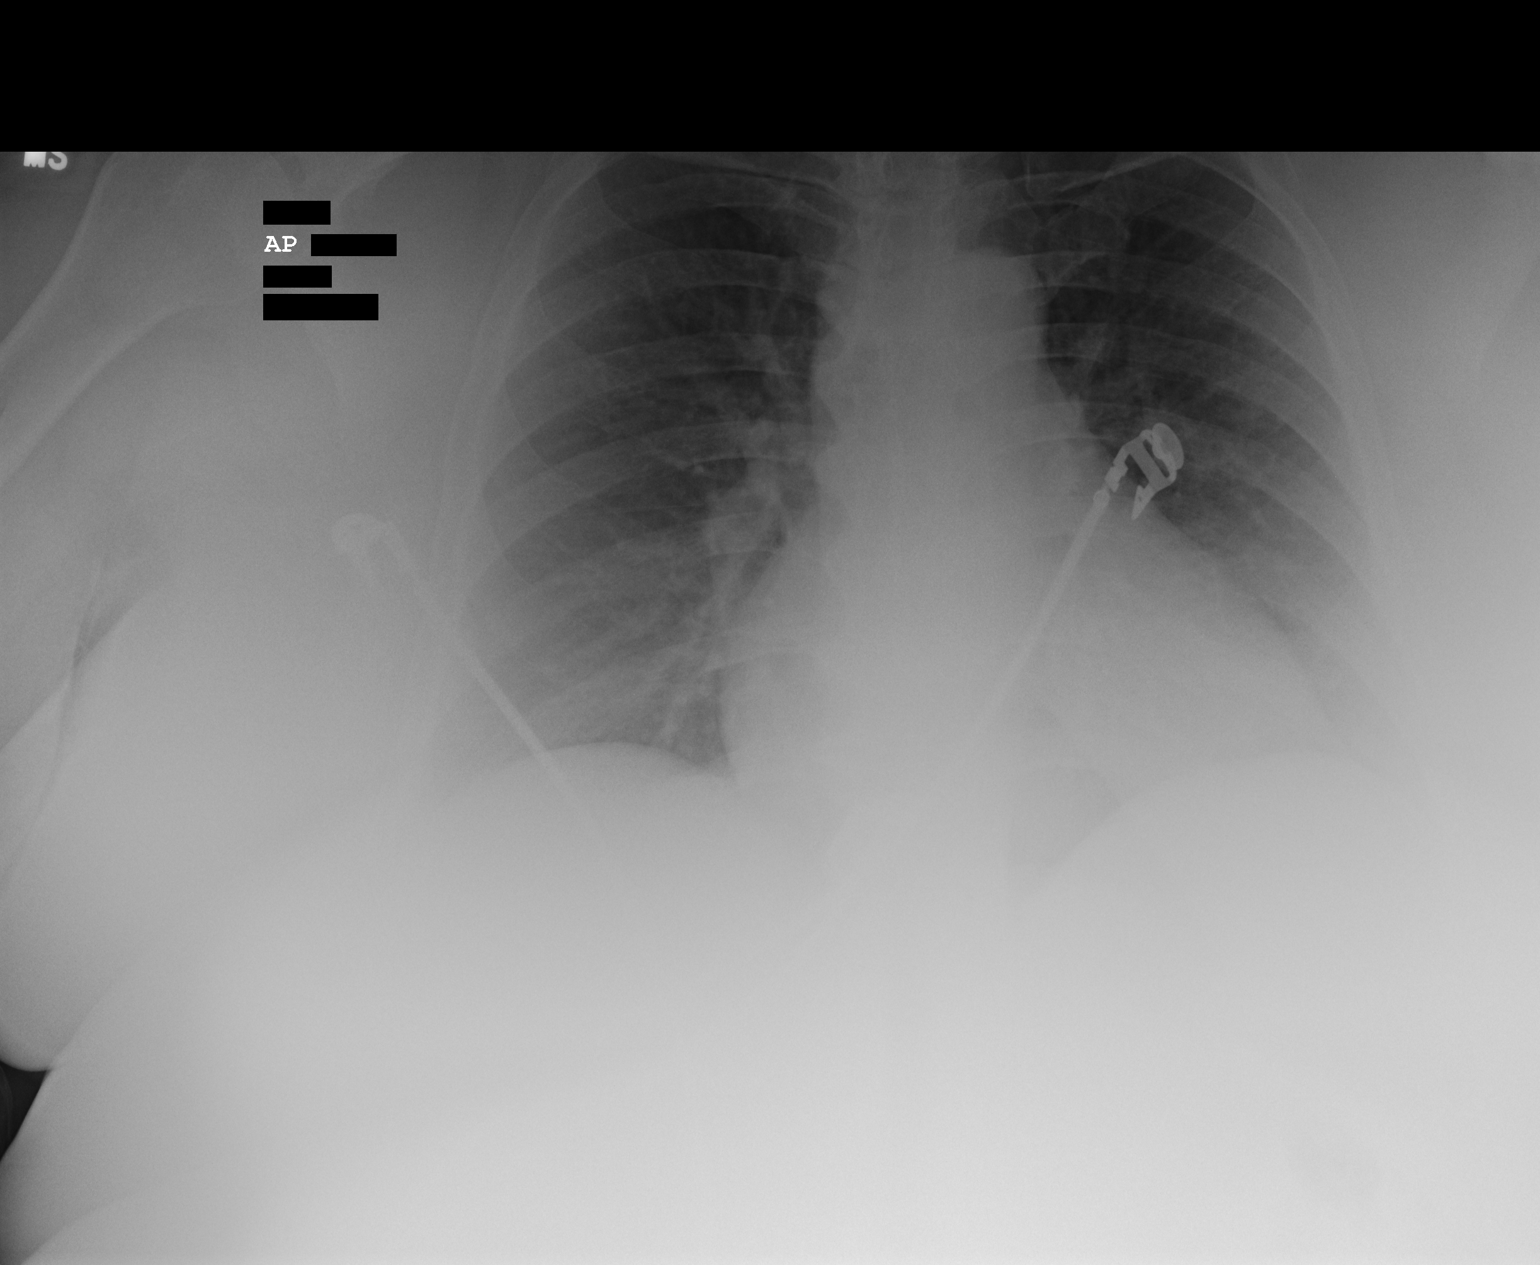

[1 of 1 positions shown; findings below may reference images not displayed]

IMPRESSION: No acute cardiopulmonary disease.

The chest is stable from prior exam.

## 2006-07-03 ENCOUNTER — Emergency Department: Payer: Self-pay | Admitting: Unknown Physician Specialty

## 2006-07-03 ENCOUNTER — Other Ambulatory Visit: Payer: Self-pay

## 2006-07-15 IMAGING — US US NEEDLE LOCALIZATION*R*
1 series · 17 of 19 positions shown · non-contrast
Comparison: none

REASON FOR EXAM: right breast mass (surgery 10 am)
COMMENTS:

[Series 1: us needle localization*right* · 17 of 19 slices shown]
[im 1/19]
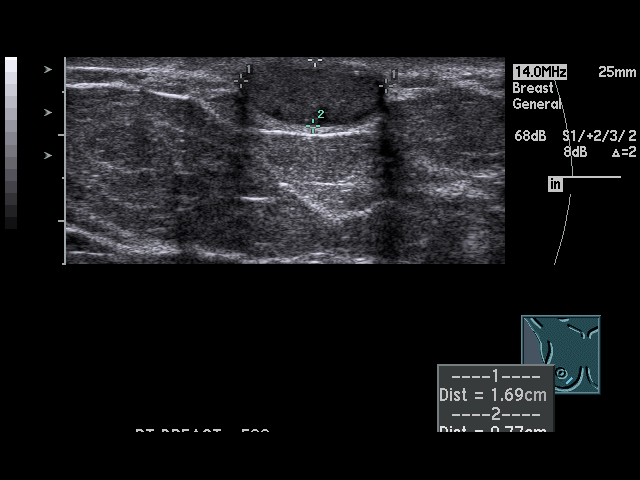
[im 2/19]
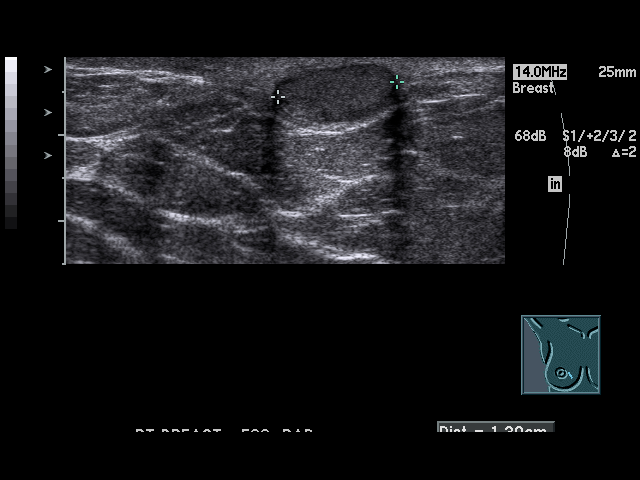
[im 3/19]
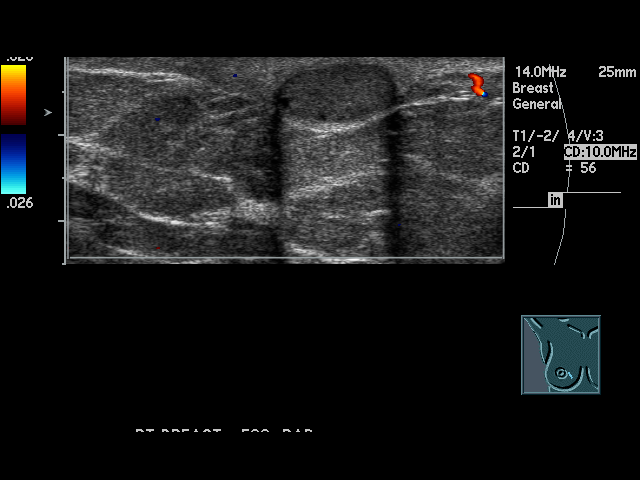
[im 4/19]
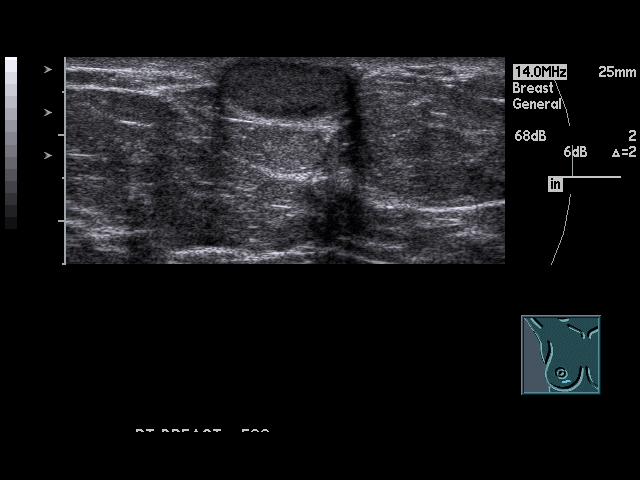
[im 6/19]
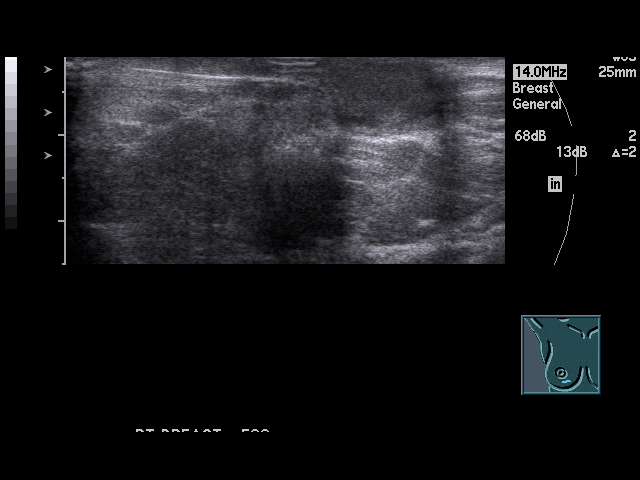
[im 7/19]
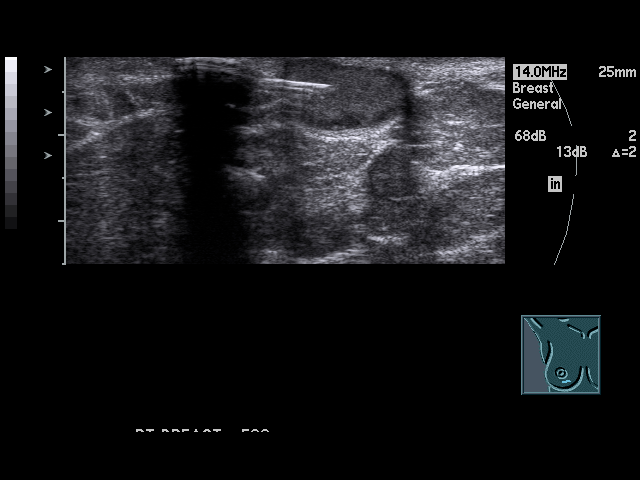
[im 8/19]
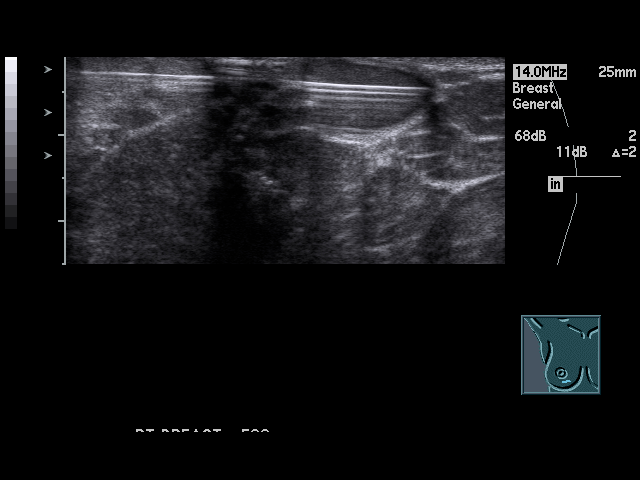
[im 9/19]
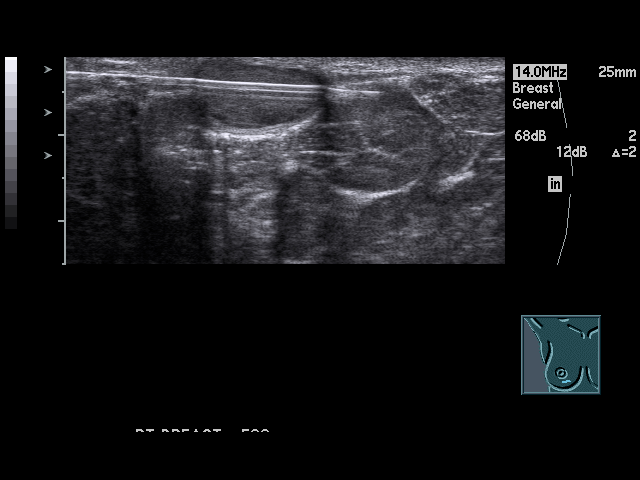
[im 10/19]
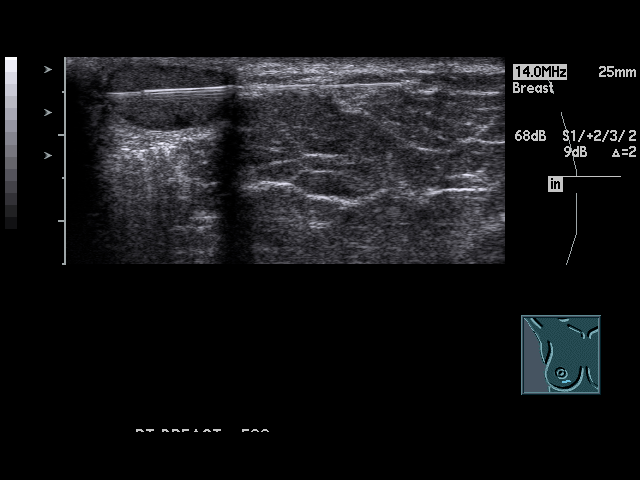
[im 11/19]
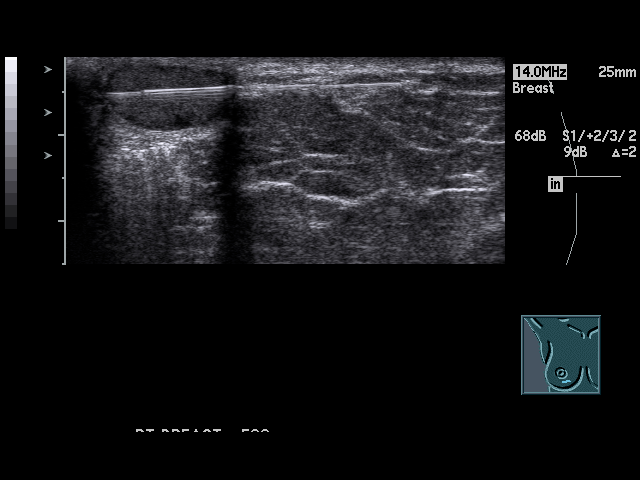
[im 12/19]
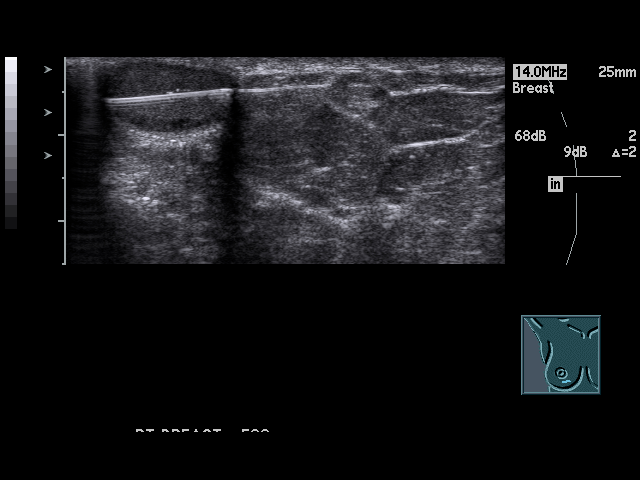
[im 13/19]
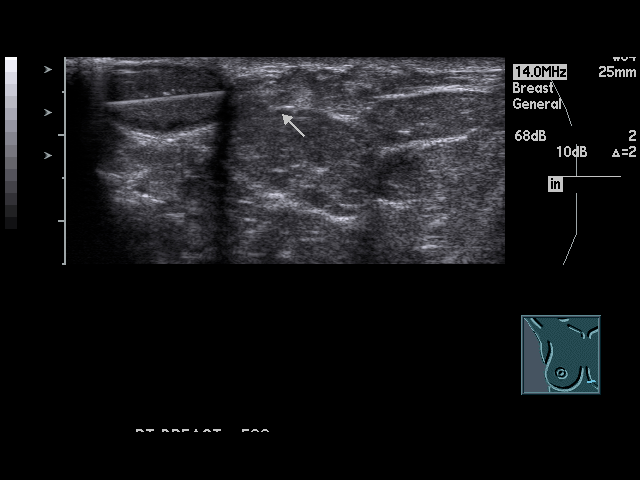
[im 14/19]
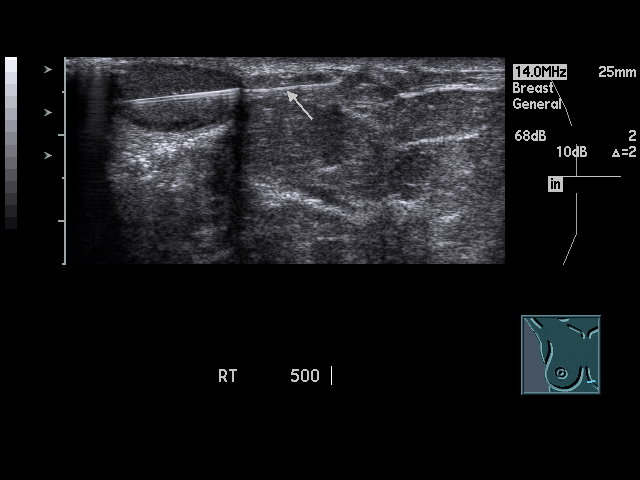
[im 16/19]
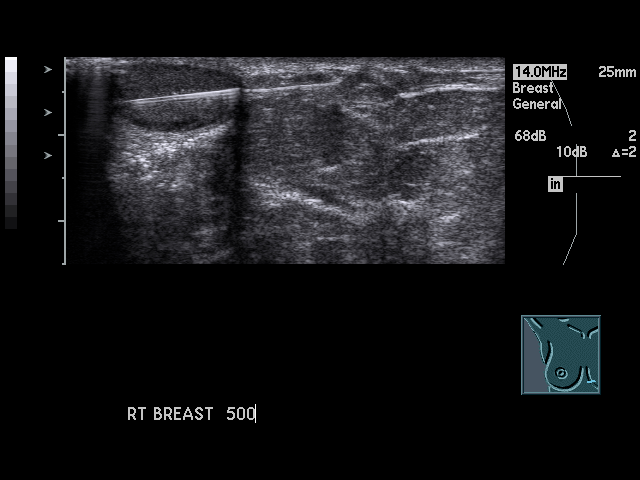
[im 17/19]
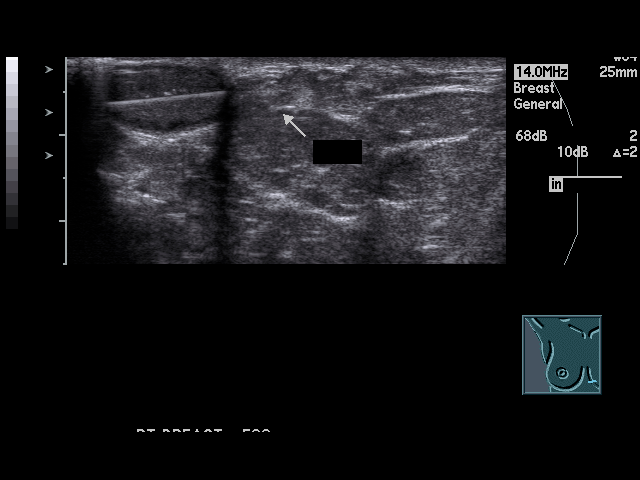
[im 18/19]
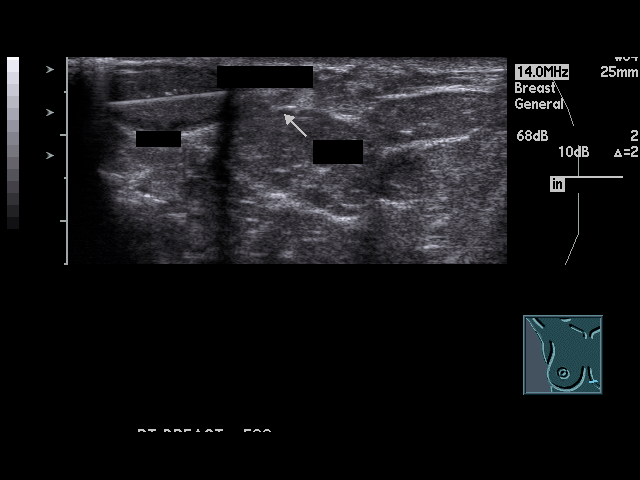
[im 19/19]
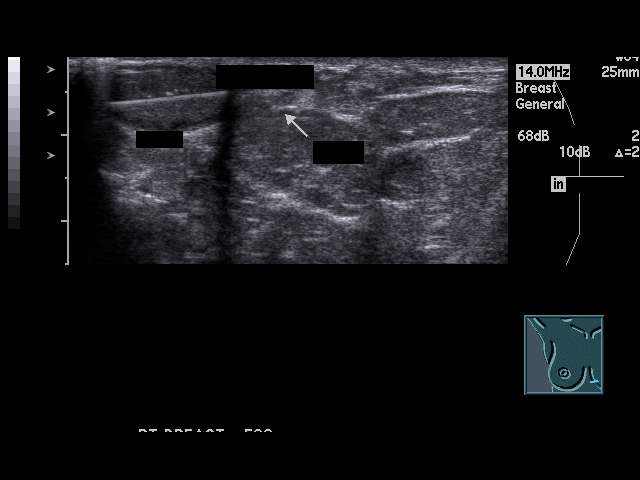

[17 of 19 positions shown; findings below may reference images not displayed]

PROCEDURE:     US  - US GUIDED NEEDLE LOCAL R BREAST  - June 20, 2005  [DATE]

RESULT:     Ultrasound guided needle localization of the RIGHT breast was
performed in a sterile fashion. The overlying soft tissues were anesthetized
with approximately 5 ml of 1% lidocaine.  A 9-cm entry Kopans needle was
used to cannulate the hypoechoic nodule.  Needle placement was performed and
confirmed with ultrasound evaluation and guidance.  Once the needle was
placed in an appropriate position confirmatory imaging was obtained.  A
modified Kopans guidewire was then placed through the entry needle.
Appropriate wire placement was confirmed with ultrasound evaluation.
Confirmatory images were obtained. The patient was then sent to the Surgery
Suite for lumpectomy.  The patient tolerated the procedure without
complications.
IMPRESSION: 1)Ultrasound guided RIGHT breast  needle localization as described above.
The patient tolerated the procedure without complications.

## 2006-09-27 ENCOUNTER — Ambulatory Visit: Payer: Self-pay | Admitting: Cardiology

## 2006-09-27 ENCOUNTER — Ambulatory Visit: Payer: Self-pay | Admitting: Pulmonary Disease

## 2006-09-27 ENCOUNTER — Encounter: Payer: Self-pay | Admitting: Pulmonary Disease

## 2006-09-28 ENCOUNTER — Inpatient Hospital Stay (HOSPITAL_COMMUNITY): Admission: RE | Admit: 2006-09-28 | Discharge: 2006-10-02 | Payer: Self-pay | Admitting: Orthopedic Surgery

## 2006-09-28 ENCOUNTER — Ambulatory Visit: Payer: Self-pay | Admitting: Internal Medicine

## 2006-11-08 IMAGING — CR DG CHEST 2V
1 series · 2 of 2 positions shown · non-contrast
Comparison: none

REASON FOR EXAM: chest pain
COMMENTS:  LMP: Post-Menopausal

PROCEDURE:     DXR - DXR CHEST PA (OR AP) AND LATERAL  - October 14, 2005  [DATE]
RESULT:          There is no evidence of focal infiltrates, effusions or
edema.  The cardiac silhouette is enlarged.  The visualized bony skeleton is
unremarkable.

[Series 1: view not recorded · 0.17mm/px · 2 of 2 slices shown]
[im 1/2]
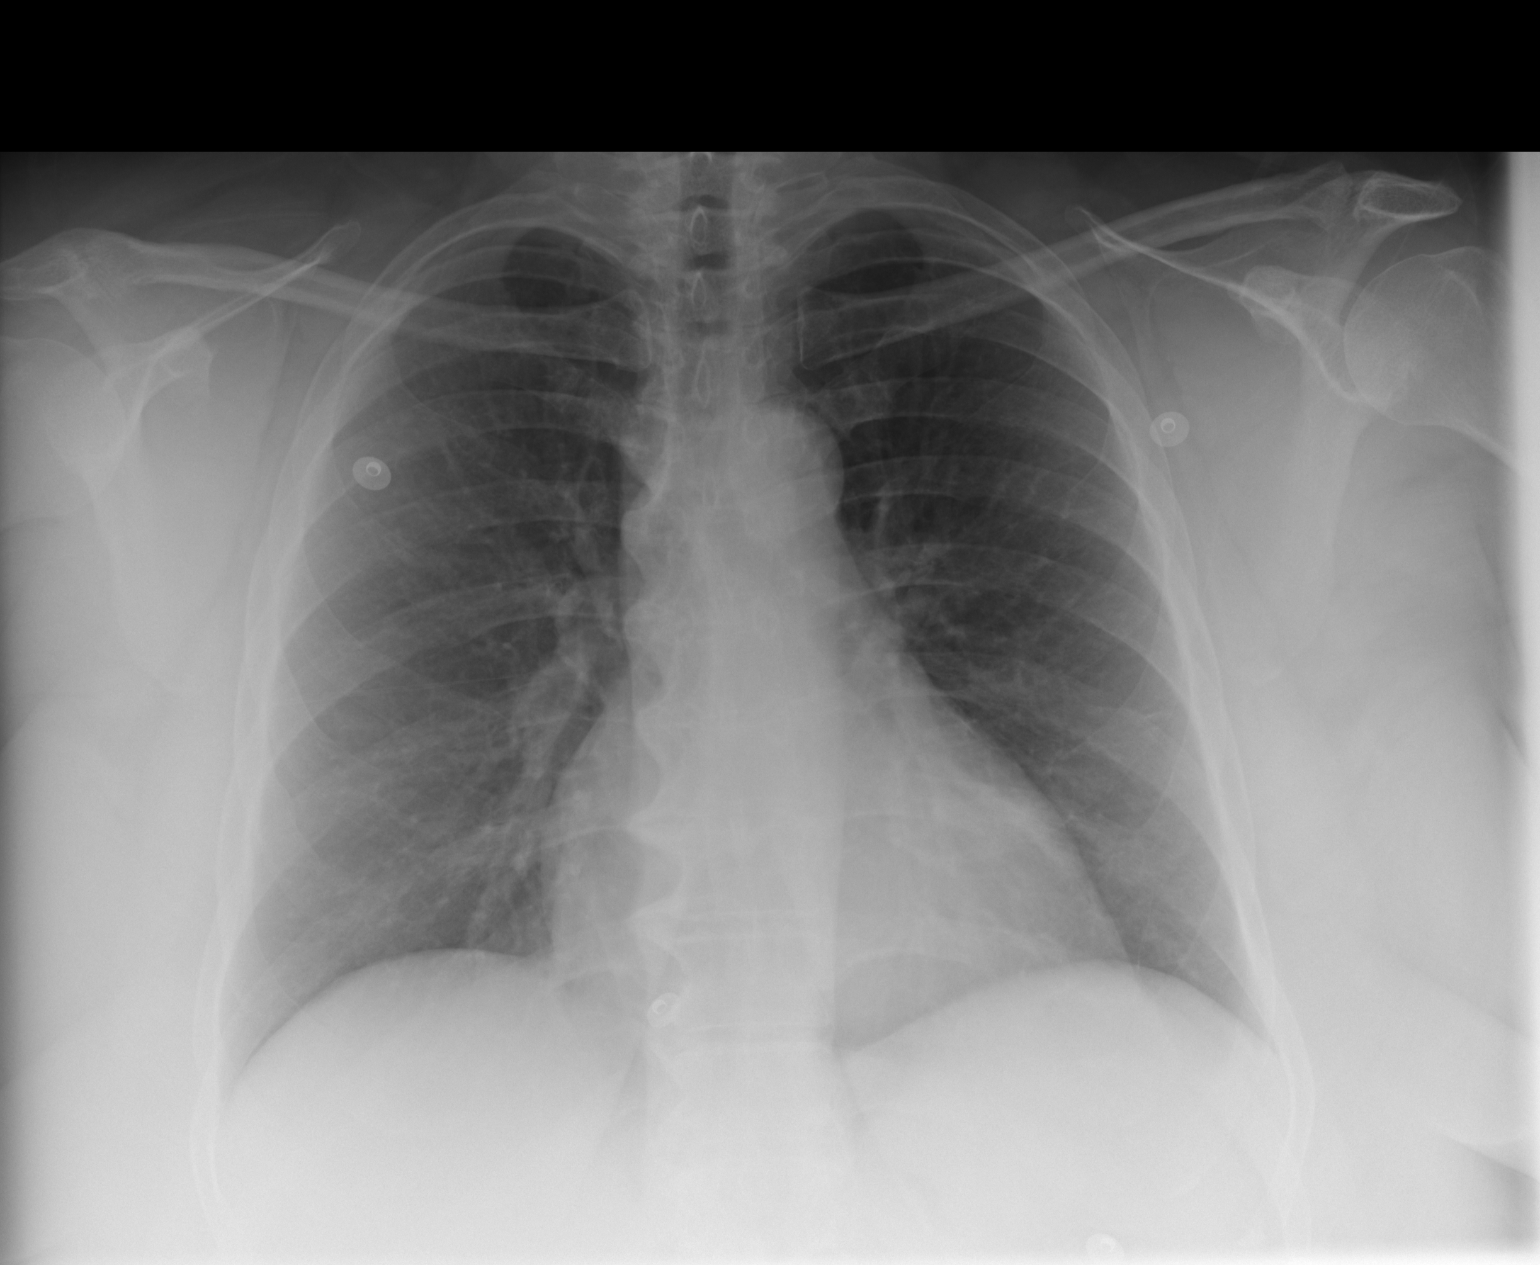
[im 2/2]
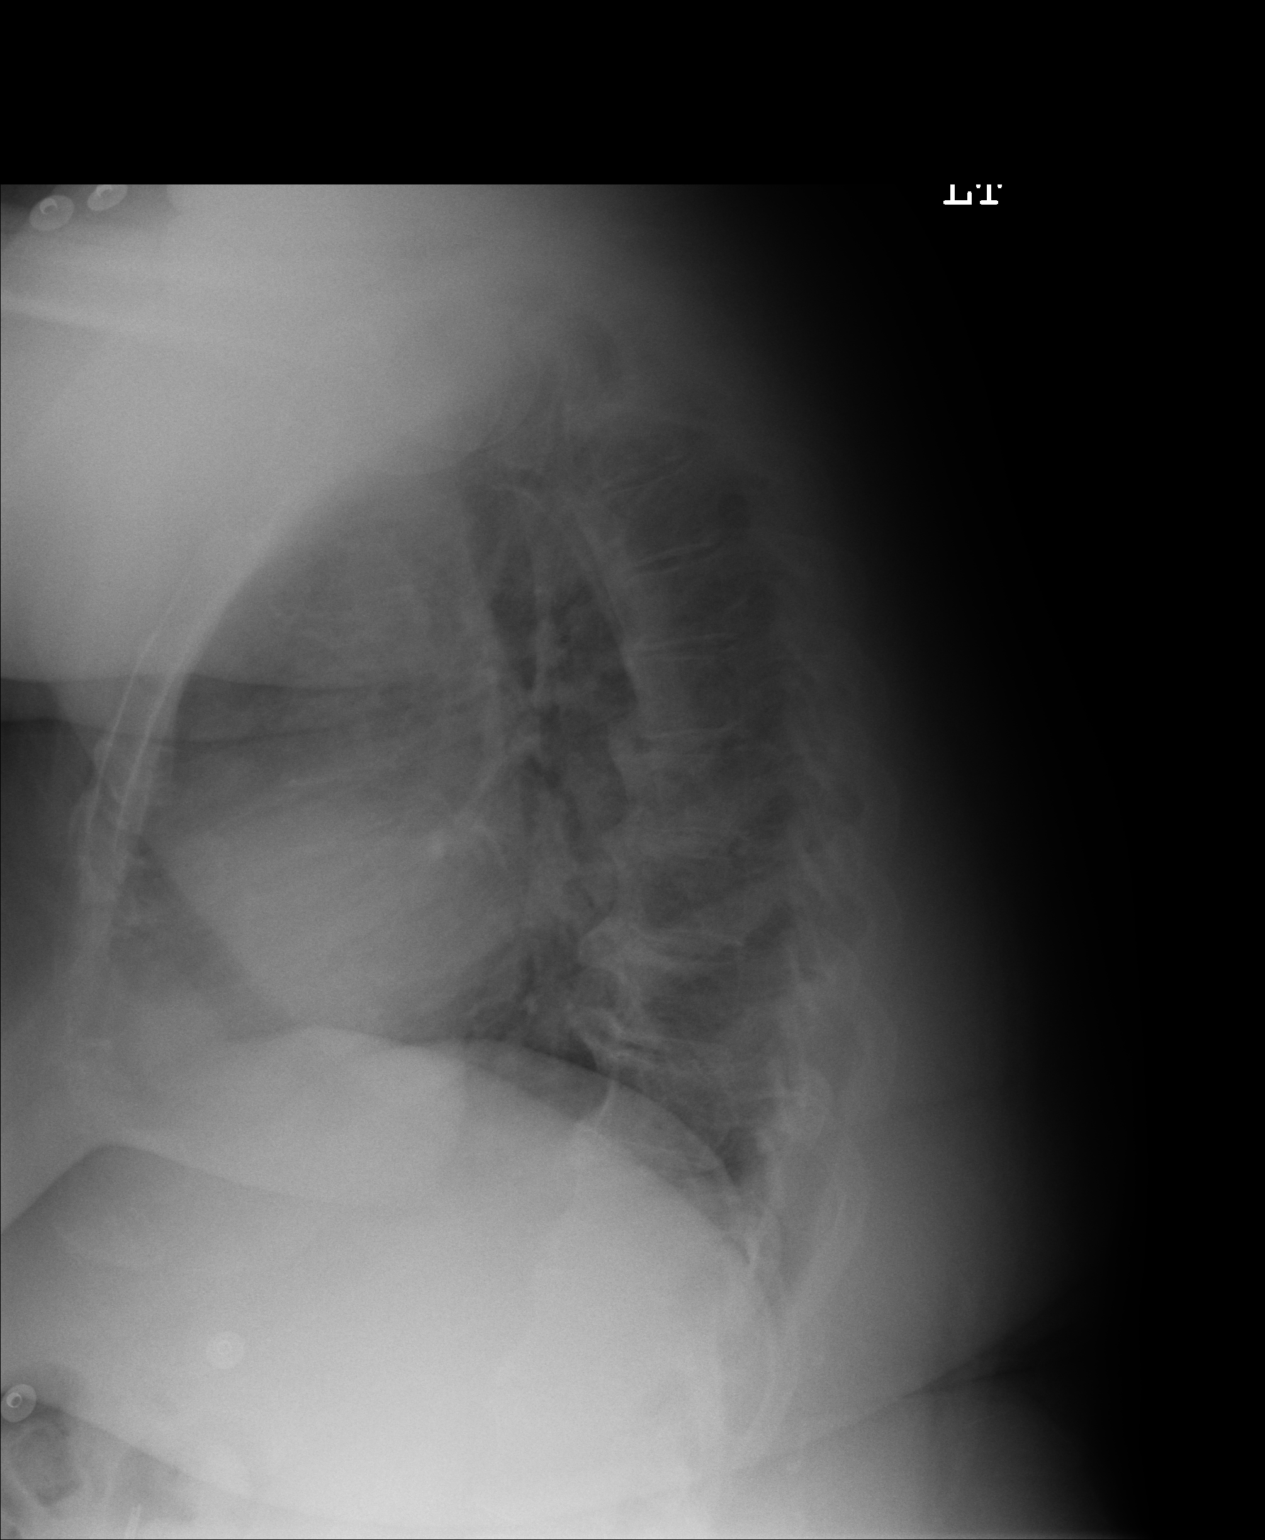

[2 of 2 positions shown; findings below may reference images not displayed]

IMPRESSION: Mild-to-moderate cardiomegaly without focal regions of
consolidation.

## 2006-11-12 IMAGING — CR DG CHEST 2V
1 series · 2 of 2 positions shown · non-contrast
Comparison: none

REASON FOR EXAM: Shortness of breath
COMMENTS:  LMP: Post-Menopausal

[Series 1: view not recorded · 0.17mm/px · 2 of 2 slices shown]
[im 1/2]
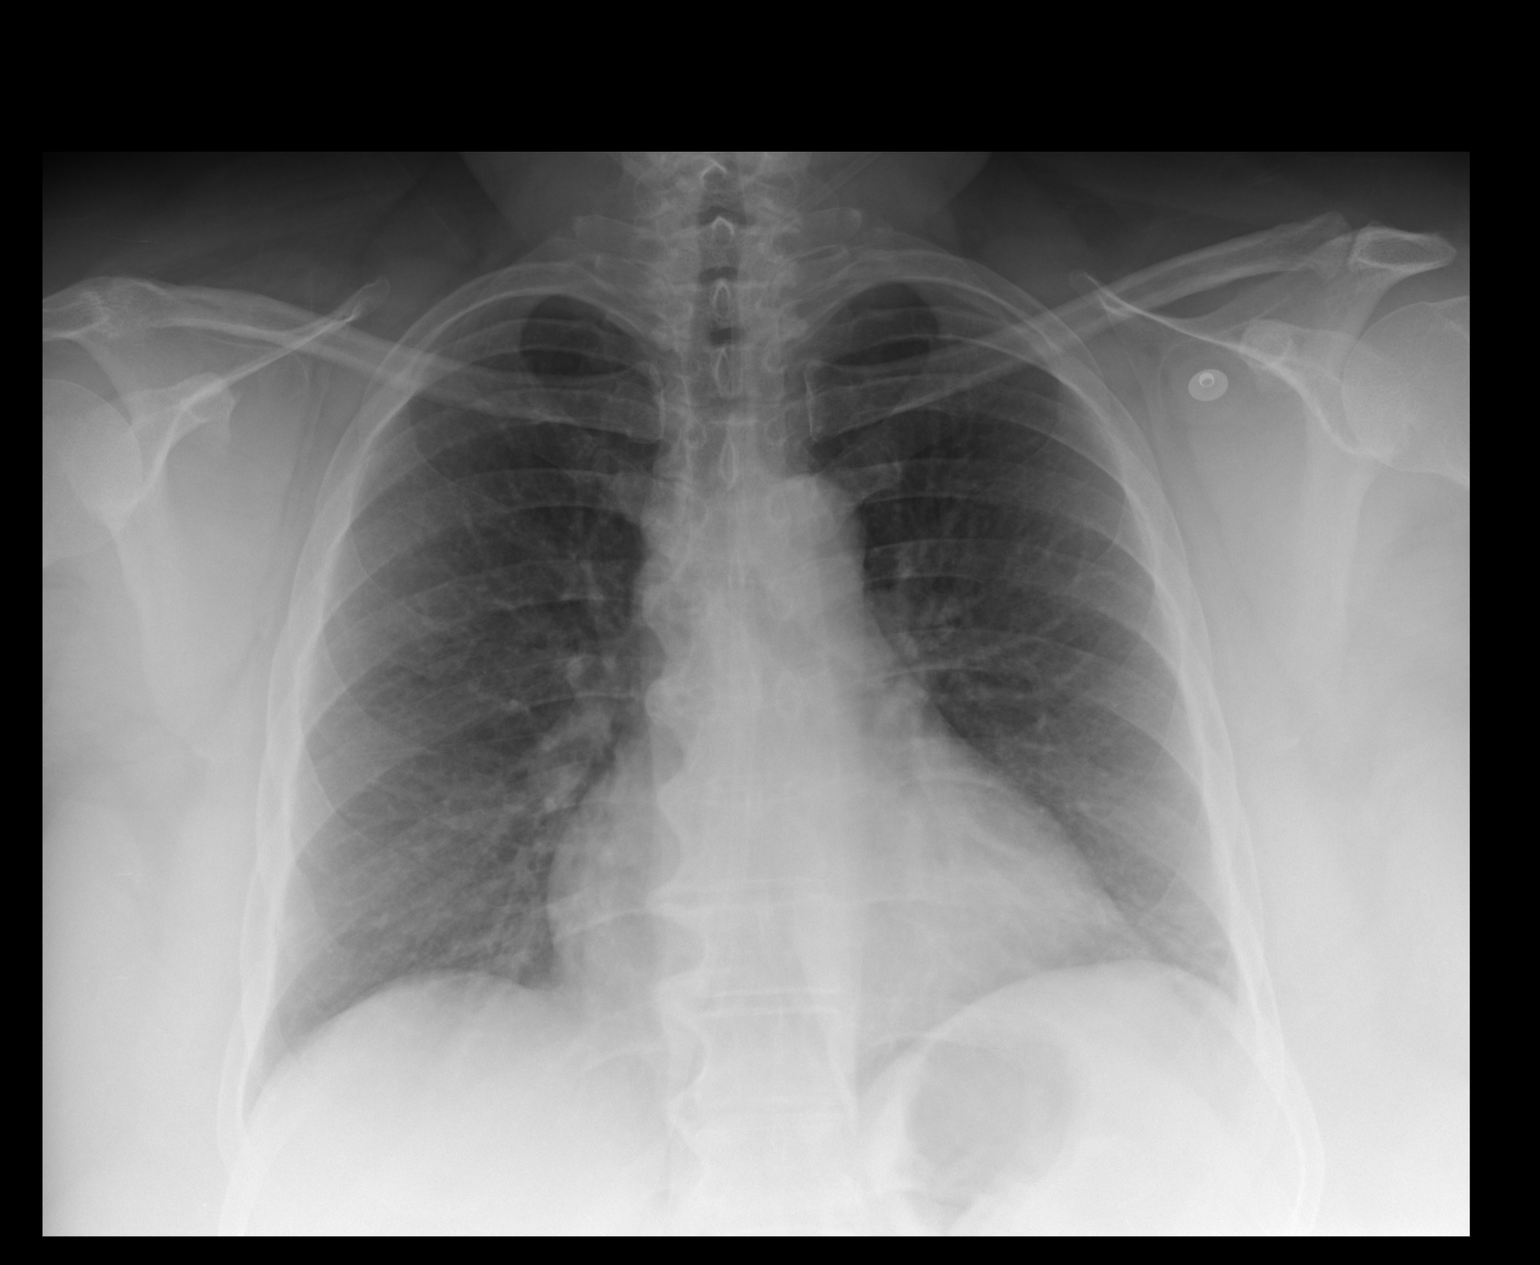
[im 2/2]
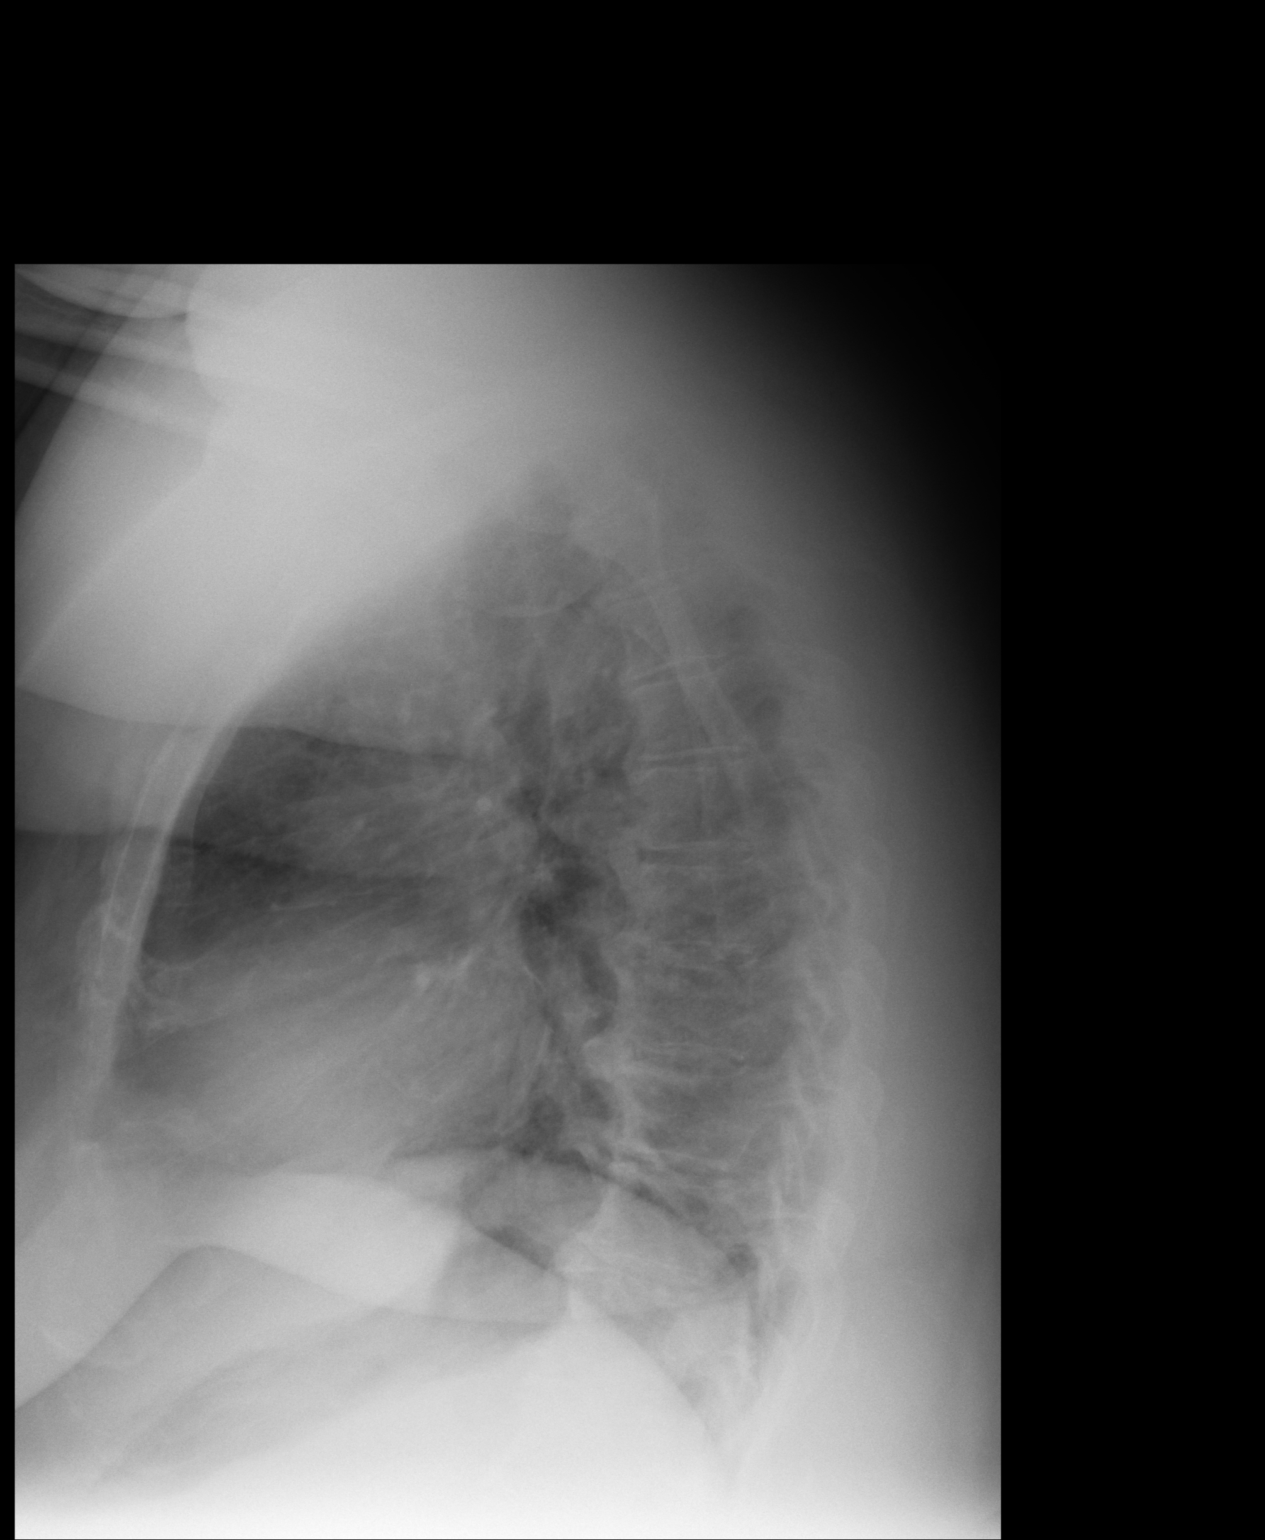

[2 of 2 positions shown; findings below may reference images not displayed]

PROCEDURE:     DXR - DXR CHEST PA (OR AP) AND LATERAL  - October 18, 2005  [DATE]

RESULT:     The current exam is compared to a prior exam of 10/14/2005. The
lung fields are clear. No pneumonia, pneumothorax or pleural effusion is
seen. The heart is upper limits for normal in size or slightly enlarged but
stable as compared to the prior exam. The mediastinal and osseous structures
show no acute changes. Degenerative spurring is noted anteriorly at multiple
levels of the thoracic spine.
IMPRESSION: 1.     Stable appearing chest as compared to the exam of 10/14/2005.
[DATE].     The lung fields are clear.
3.     The heart is upper limits for normal in size or slightly enlarged.
4.     There is degenerative spurring anteriorly at multiple levels of the
thoracic spine.

## 2006-11-14 IMAGING — CT CT PARANASAL SINUSES W/O CM
1 series · 16 of 28 positions shown, 20 images · non-contrast
Comparison: none

REASON FOR EXAM: Sinsusitis
COMMENTS:

[Series 2: sinus · axial · 0.32mm/px · z∈[+446,+571]mm · 16 of 28 slices shown, 20 images]
[im 2/28  brain]
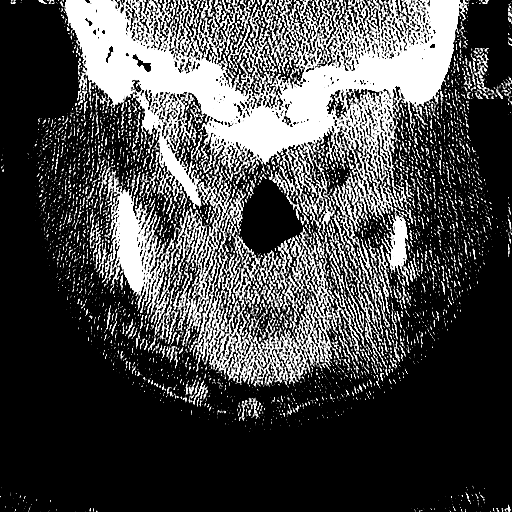
[im 2/28  bone]
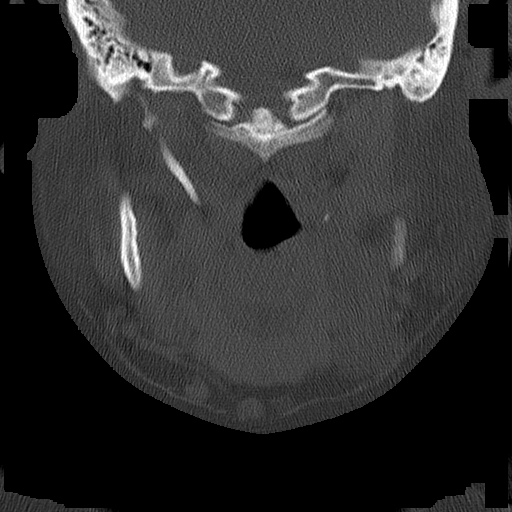
[im 4/28  bone]
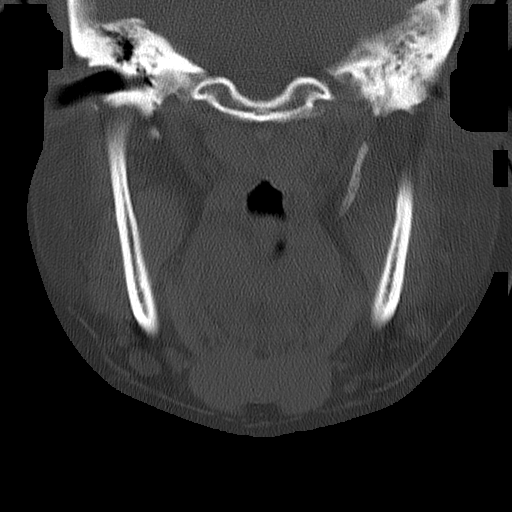
[im 6/28  bone]
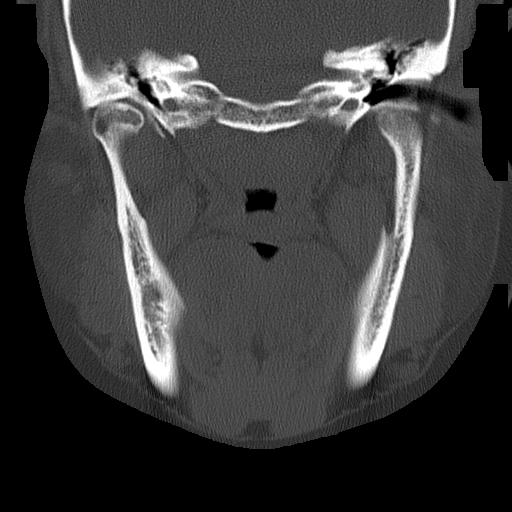
[im 7/28  bone]
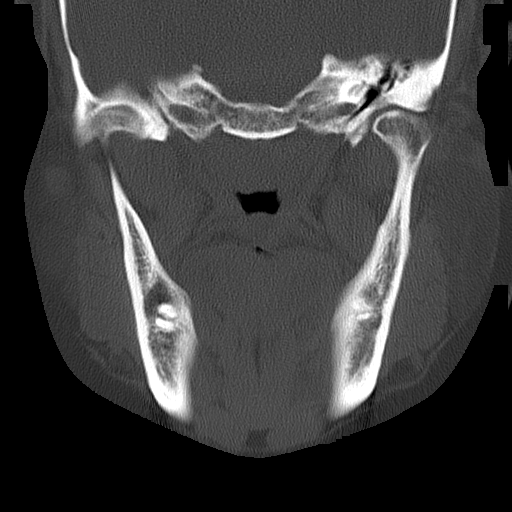
[im 9/28  brain]
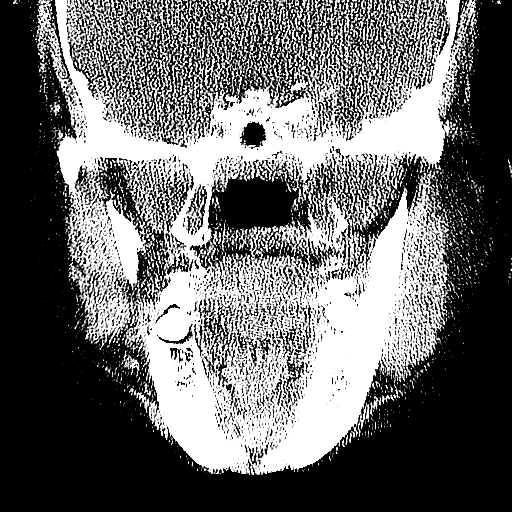
[im 9/28  bone]
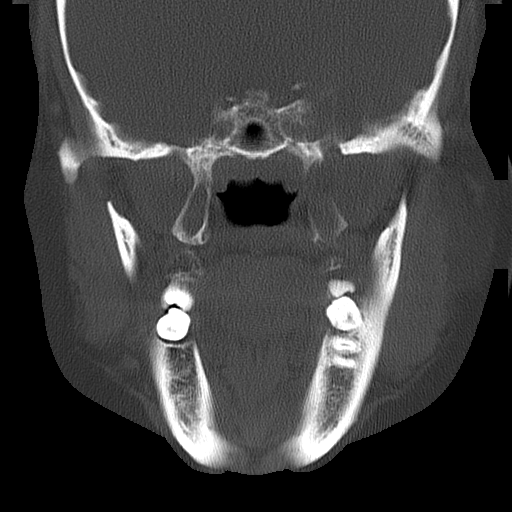
[im 10/28  bone]
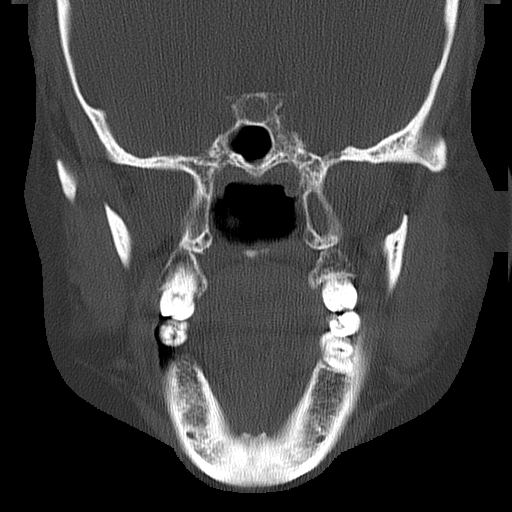
[im 12/28  bone]
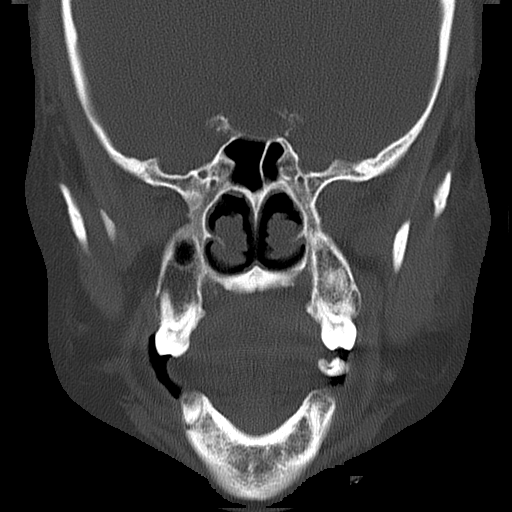
[im 14/28  bone]
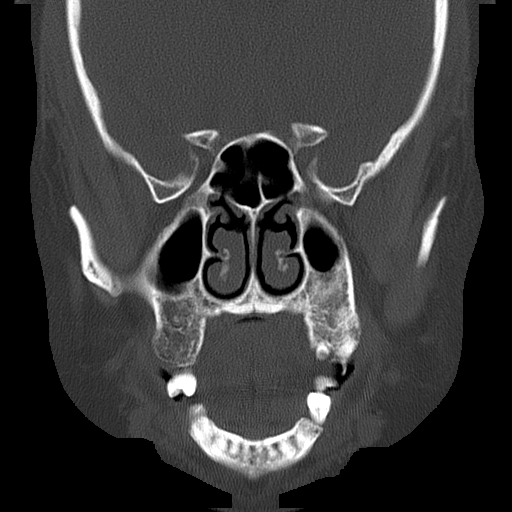
[im 15/28  brain]
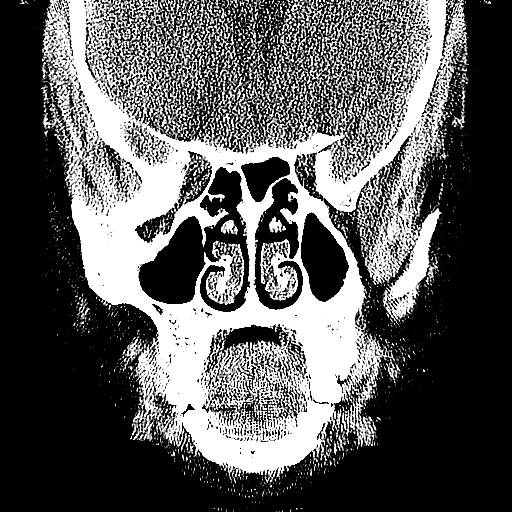
[im 15/28  bone]
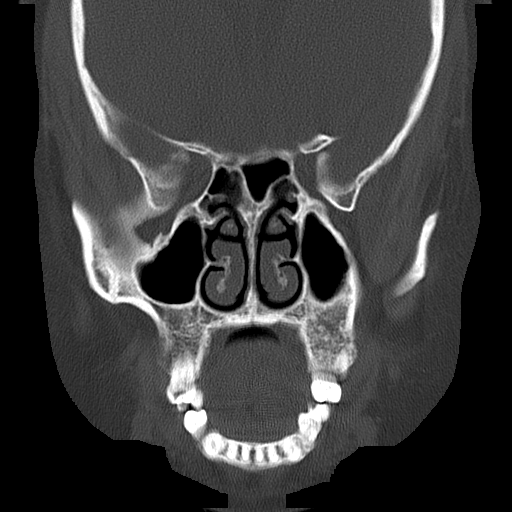
[im 17/28  bone]
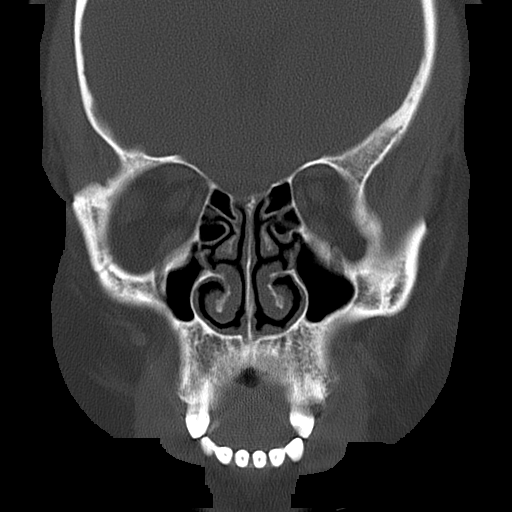
[im 19/28  bone]
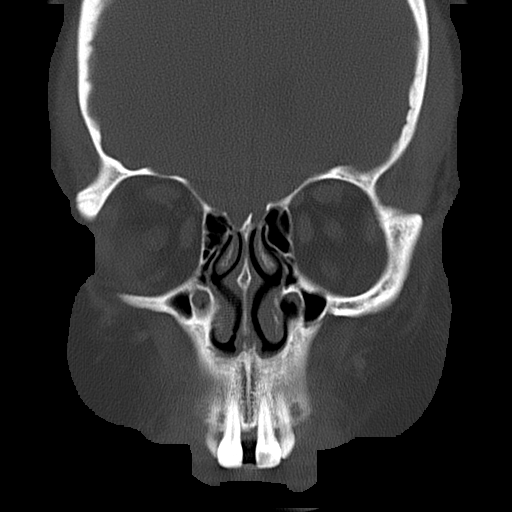
[im 20/28  bone]
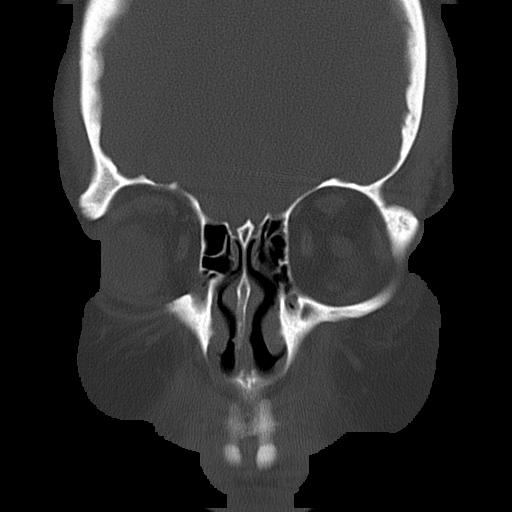
[im 22/28  brain]
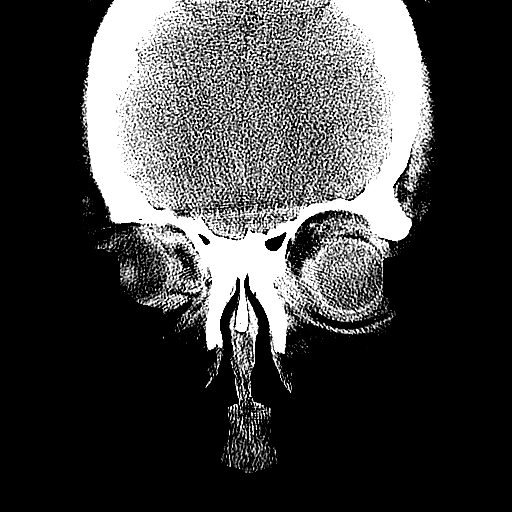
[im 22/28  bone]
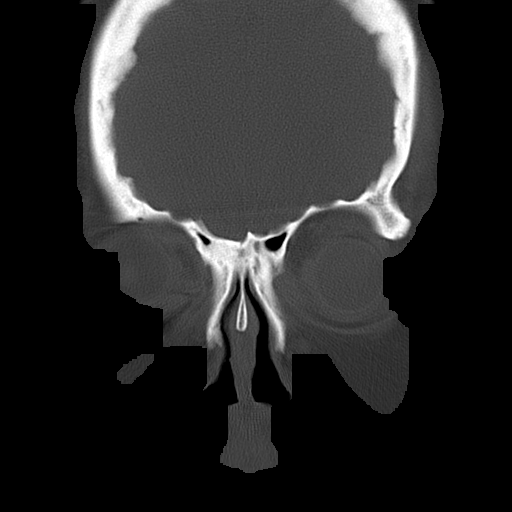
[im 23/28  bone]
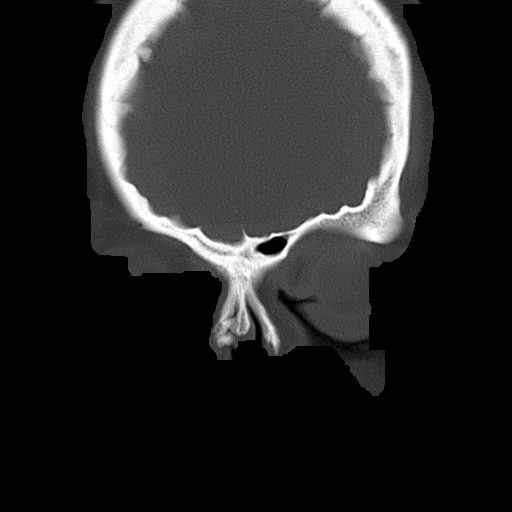
[im 25/28  bone]
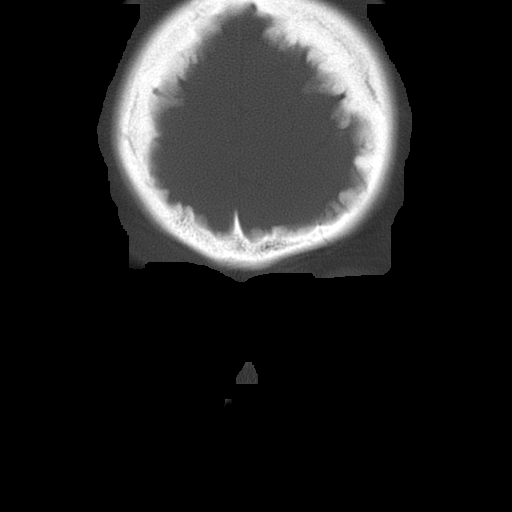
[im 27/28  bone]
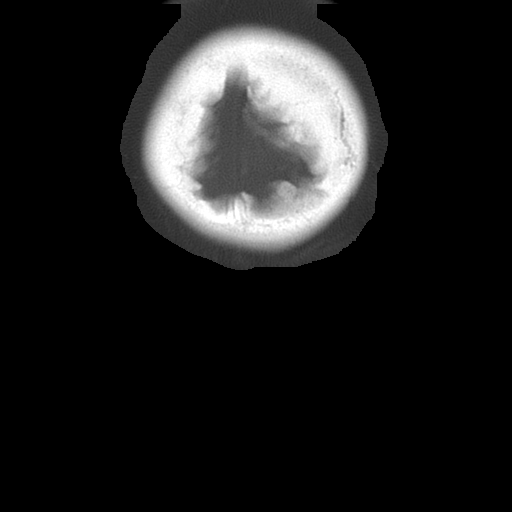

[16 of 28 positions shown; findings below may reference images not displayed]

PROCEDURE:     CT  - CT SINUSES WITHOUT CONTRAST  - October 20, 2005  [DATE]

RESULT:         The patient is being evaluated for sinus inflammation.

The frontal sinuses are hypoplastic but the aerated portions are clear.  The
ethmoid and maxillary and sphenoid sinuses are clear.  The nasal passages
are patent.  The nasal septum is midline.
IMPRESSION: I do not see evidence of acute or chronic paranasal sinus
inflammation.  The ostiomeatal units are patent bilaterally.

## 2006-12-03 IMAGING — CR DG CHEST 1V PORT
1 series · 1 of 1 positions shown · non-contrast
Comparison: none

REASON FOR EXAM: chest pain
COMMENTS:

[view not recorded]
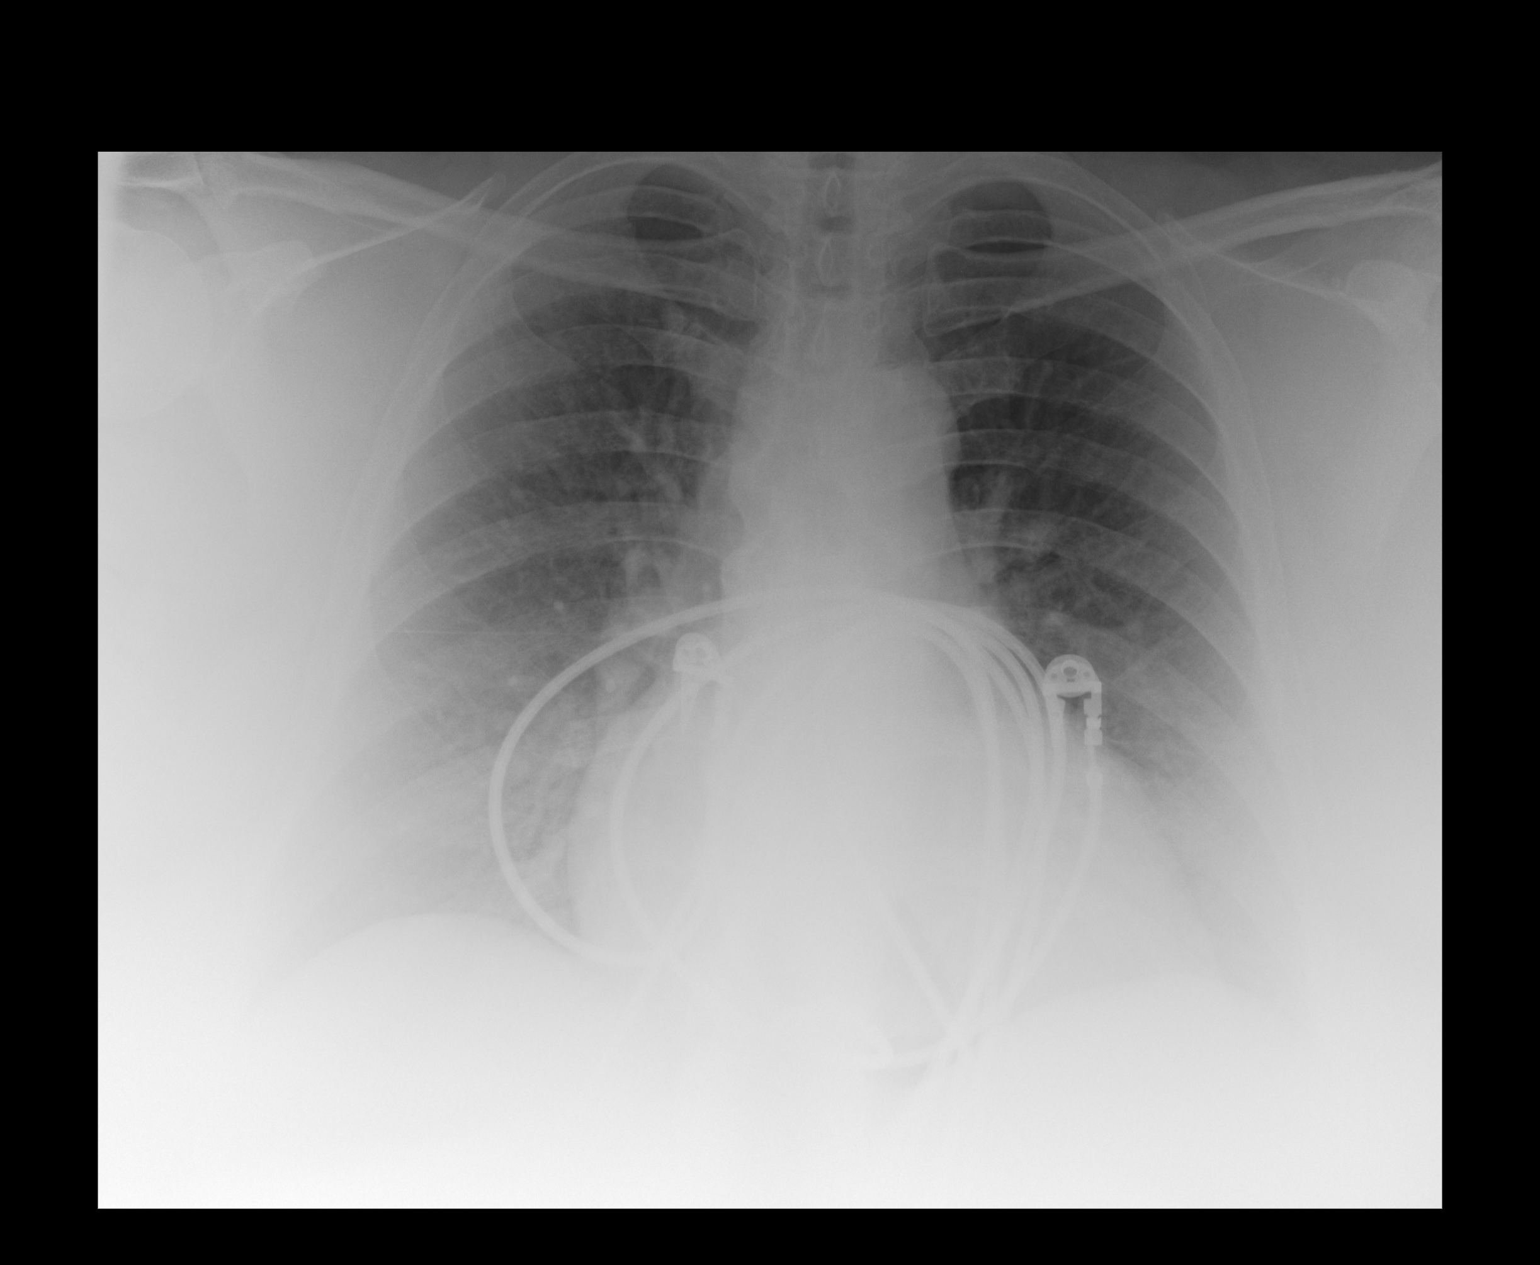

[1 of 1 positions shown; findings below may reference images not displayed]

PROCEDURE:     DXR - DXR PORTABLE CHEST SINGLE VIEW  - November 08, 2005  [DATE]

RESULT:          AP view of the chest is compared to the prior exam of
10/18/2005.

The lung fields are clear.  The  heart, mediastinal and osseous structures
show no significant abnormalities.  Monitoring electrodes are present.
IMPRESSION: No acute changes are identified.

## 2007-01-24 IMAGING — CR DG CHEST 1V PORT
1 series · 1 of 1 positions shown · non-contrast
Comparison: none

REASON FOR EXAM: Chest pain
COMMENTS:

PROCEDURE:     DXR - DXR PORTABLE CHEST SINGLE VIEW  - December 30, 2005  [DATE]
RESULT:     The patient has taken a shallow inspiration. The lungs are
clear. The cardiac silhouette and visualized bony skeleton are unremarkable.

[view not recorded]
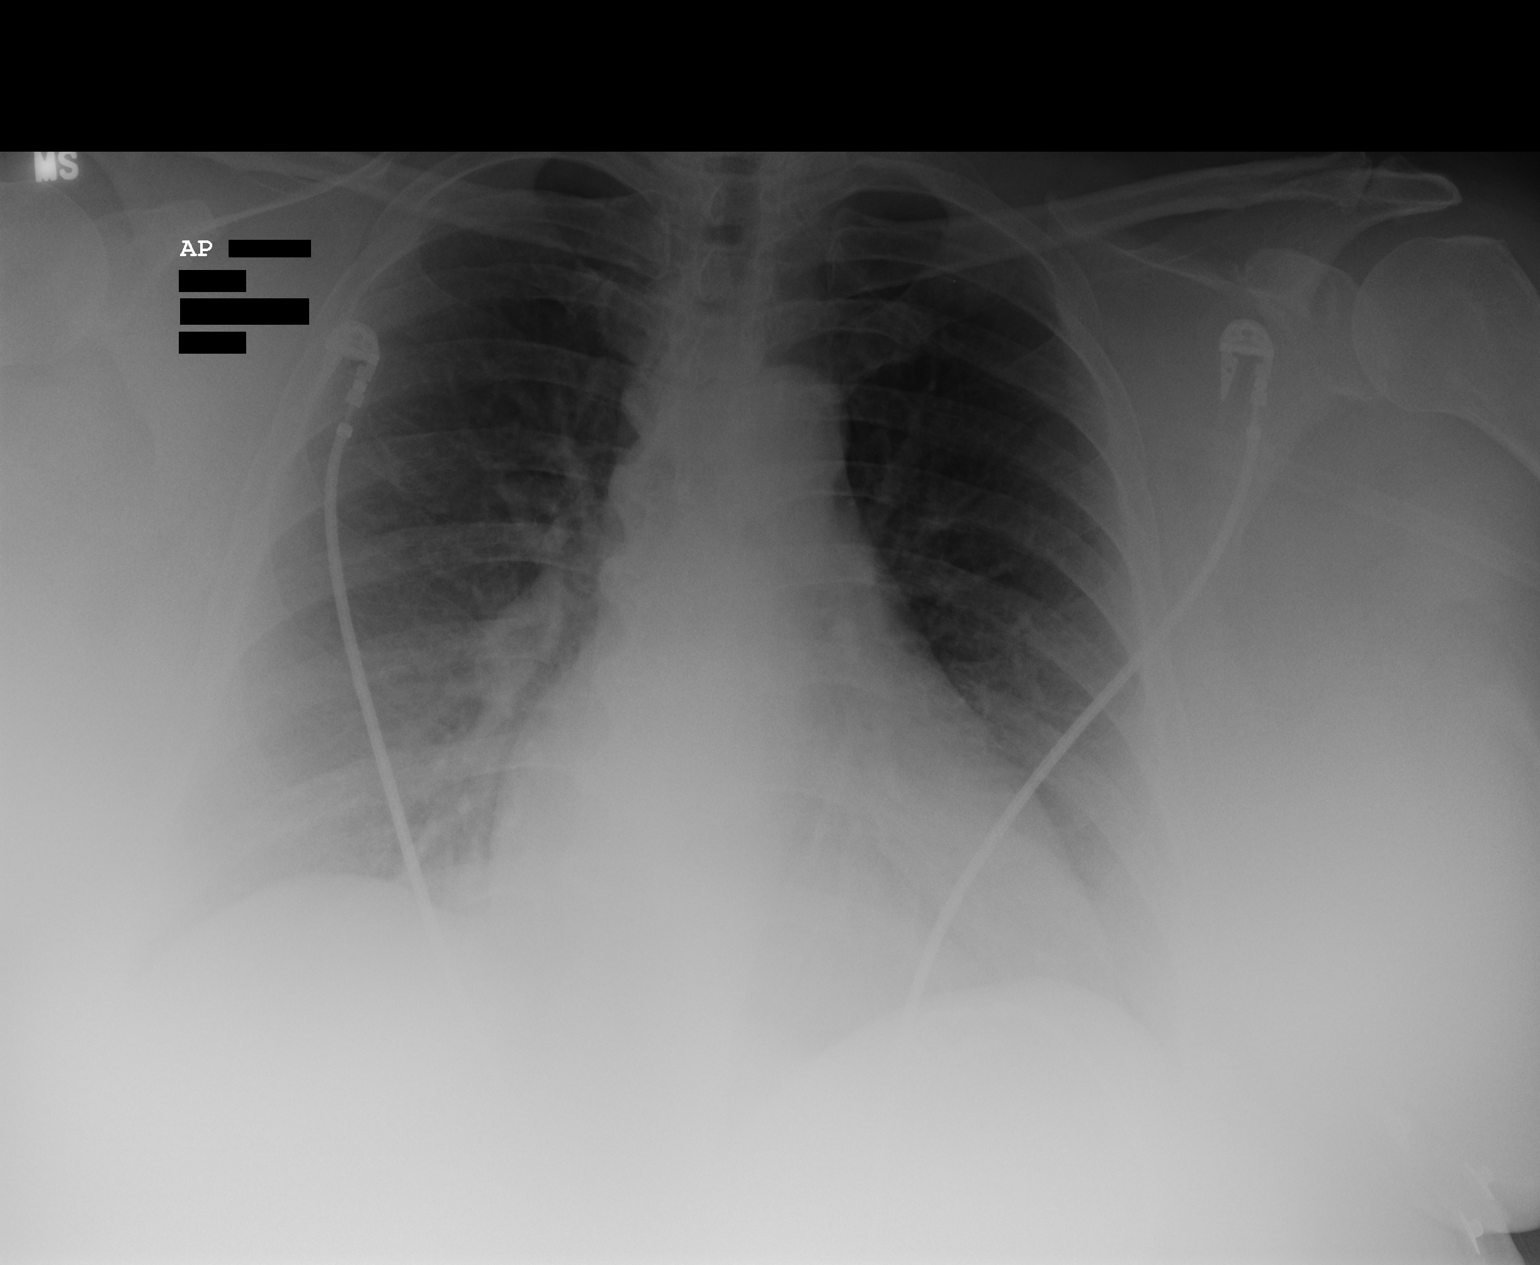

[1 of 1 positions shown; findings below may reference images not displayed]

IMPRESSION: Single frontal view of the chest without evidence of acute
cardiopulmonary disease.

## 2007-02-17 IMAGING — CT CT STONE STUDY
1 of 2 series · 16 of 32 positions shown, 20 images · non-contrast
Comparison: none

REASON FOR EXAM: Pain
COMMENTS:

PROCEDURE:     CT  - CT ABDOMEN /PELVIS WO (STONE)  - January 23, 2006  [DATE]
RESULT:     Noncontrast, emergent CT scan of abdomen and pelvis is
performed.

[Series 2: stone · axial · 0.89mm/px · z∈[+58,+482]mm · 16 of 155 slices shown, 20 images]
[im 7/155  soft-tissue]
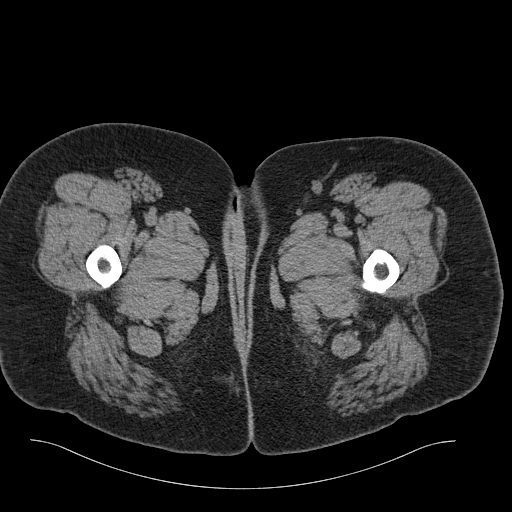
[im 7/155  bone]
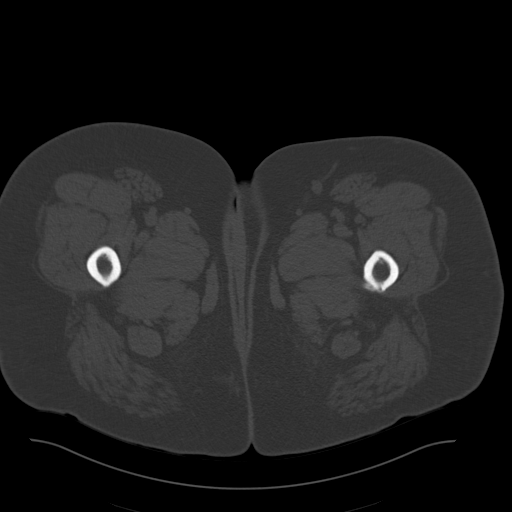
[im 19/155  soft-tissue]
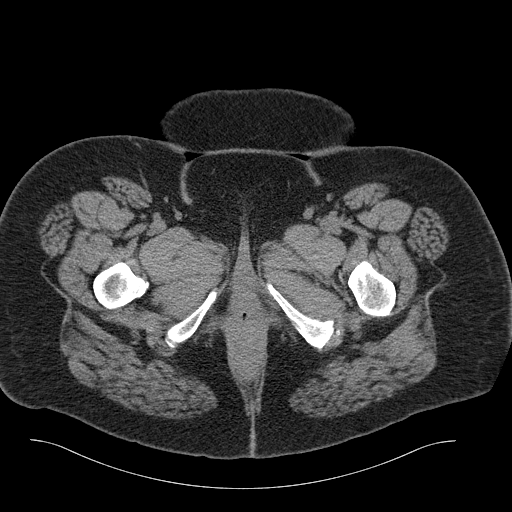
[im 31/155  soft-tissue]
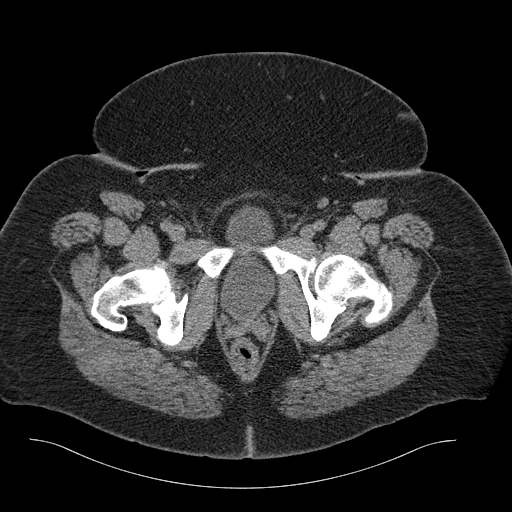
[im 44/155  soft-tissue]
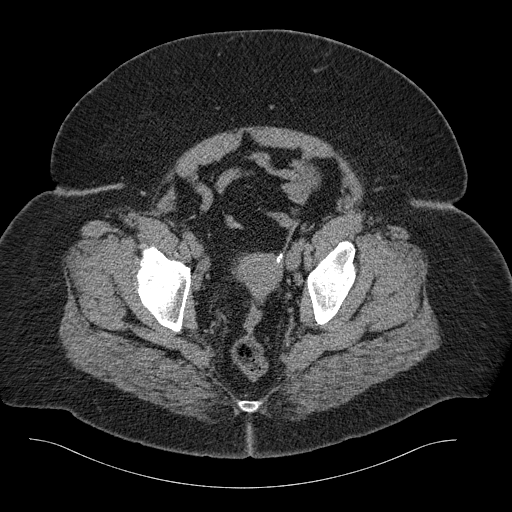
[im 50/155  soft-tissue]
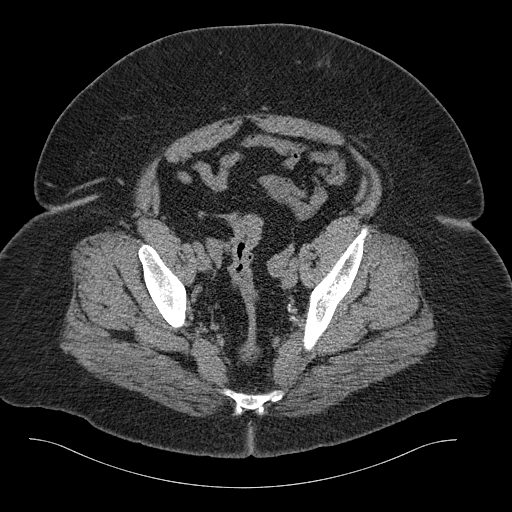
[im 62/155  soft-tissue]
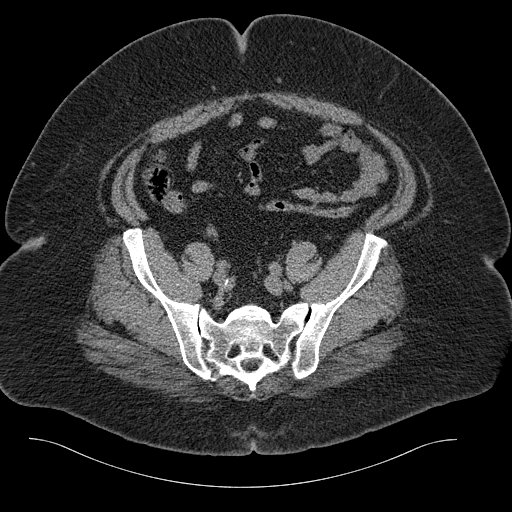
[im 74/155  soft-tissue]
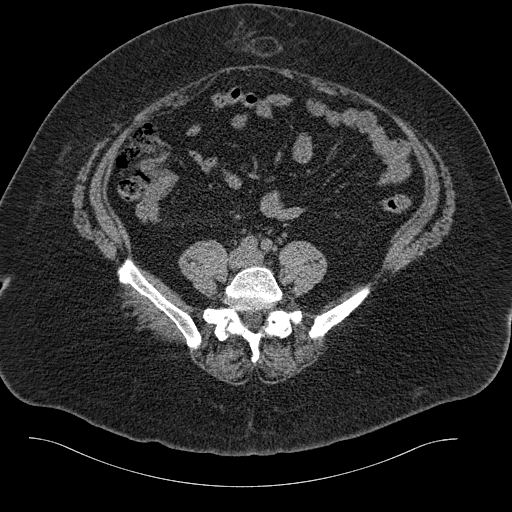
[im 81/155  soft-tissue]
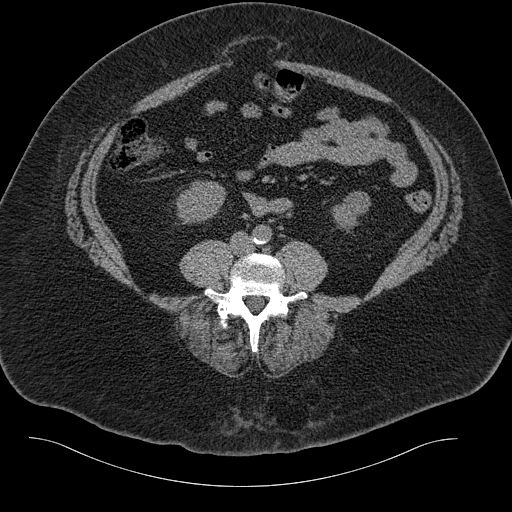
[im 93/155  soft-tissue]
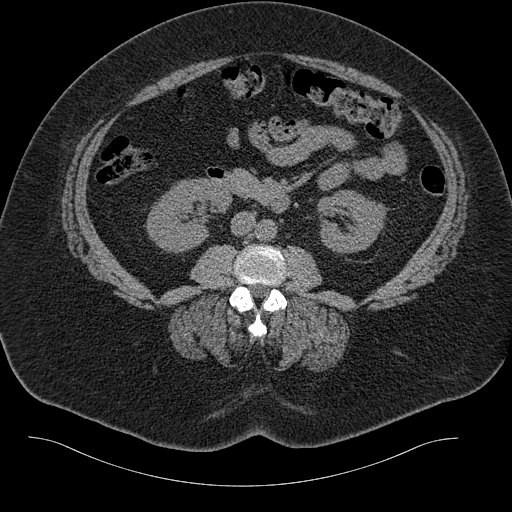
[im 93/155  bone]
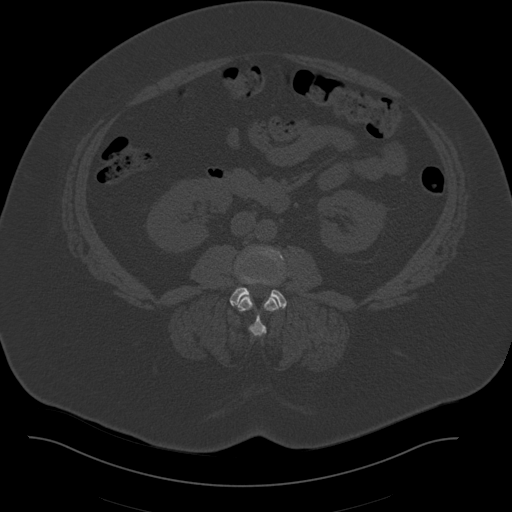
[im 105/155  soft-tissue]
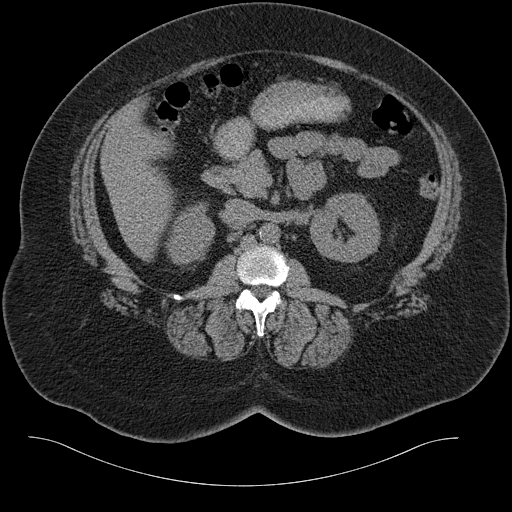
[im 118/155  soft-tissue]
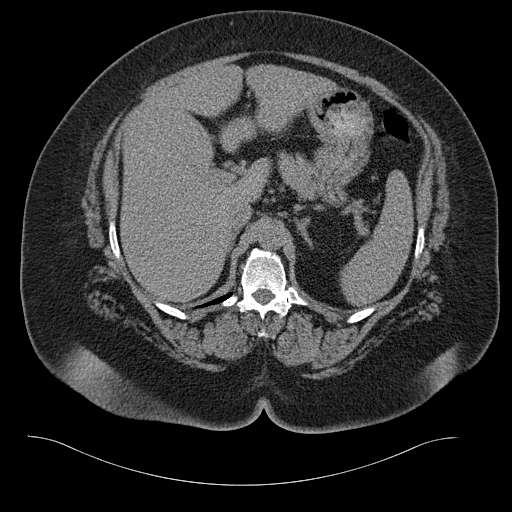
[im 124/155  soft-tissue]
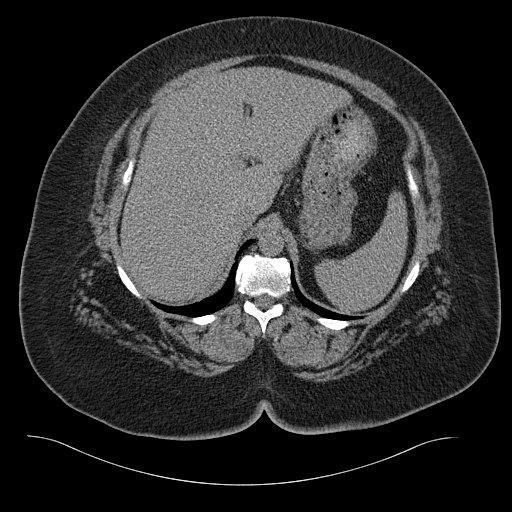
[im 130/155  lung]
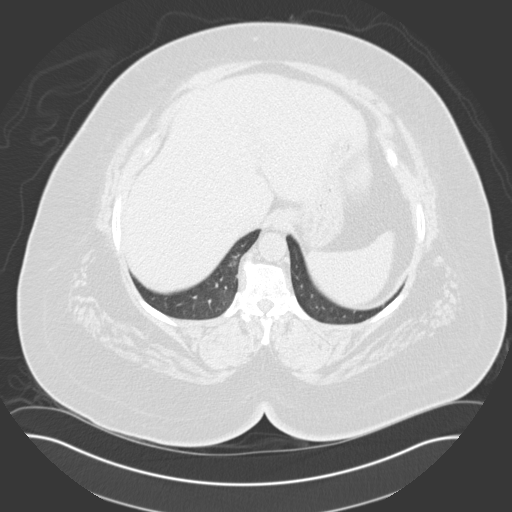
[im 136/155  soft-tissue]
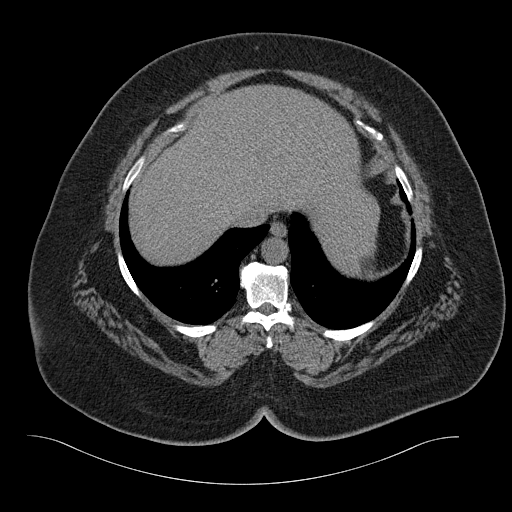
[im 136/155  lung]
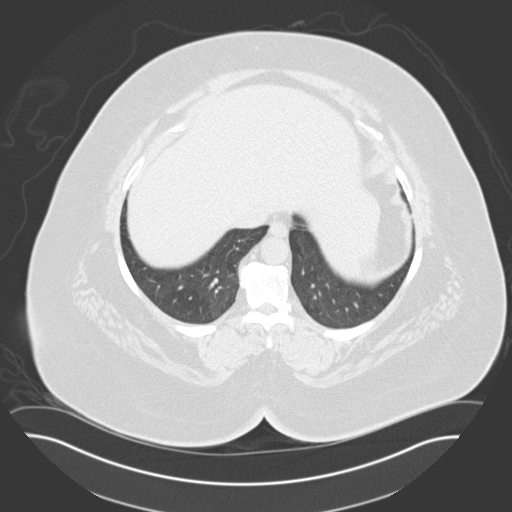
[im 142/155  lung]
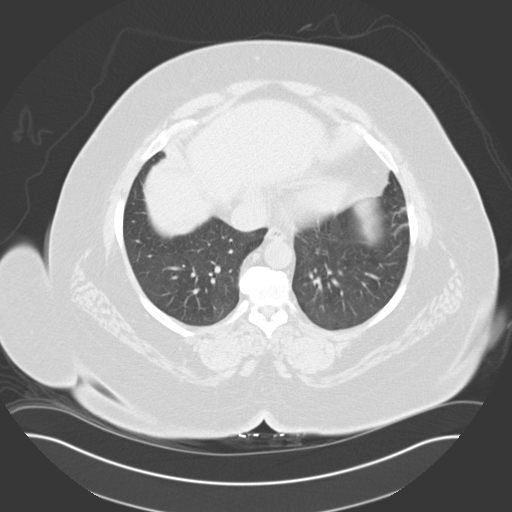
[im 148/155  soft-tissue]
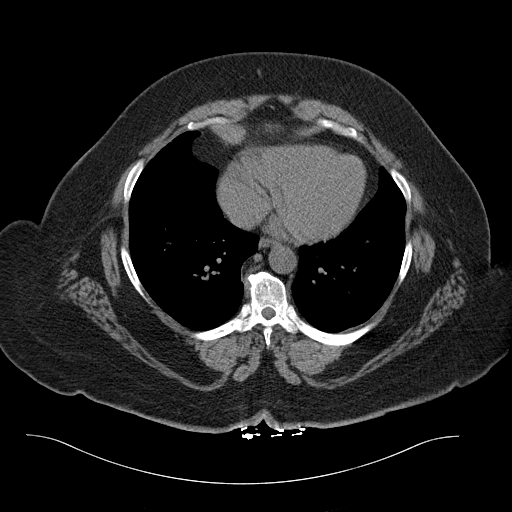
[im 148/155  lung]
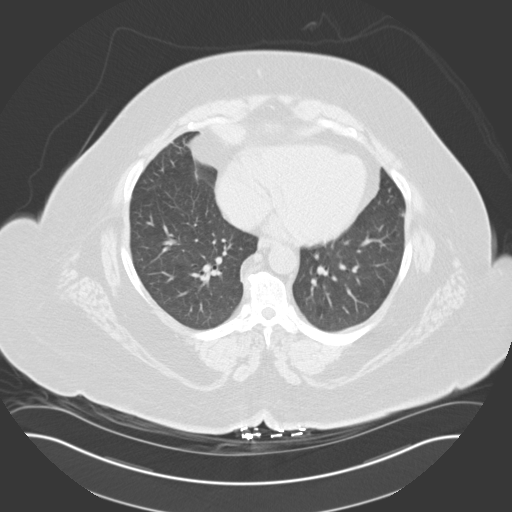

[16 of 32 positions shown; findings below may reference images not displayed]

FINDINGS: There is no evidence of free fluid. The uterus appears to be
atrophied. There is no inflammatory stranding. There is a small, umbilical
hernia not containing any loops of bowel. No radiopaque urinary tract
calculi are seen. There is no evidence of hydronephrosis. The adrenal glands
appear to be normal. The study shows the patient appears to be status post
cholecystectomy. No discrete renal mass is evident. The lung bases show
evidence of atelectasis.
IMPRESSION: 1.     No findings of urinary tract calculi.
2.     Small, umbilical hernia containing mesenteric fat only. Superior to
the umbilical hernia is a small ventral hernia or periumbilical hernia which
also contains fat only.

## 2007-06-17 IMAGING — US ABDOMEN ULTRASOUND
1 series · 17 of 25 positions shown · non-contrast
Comparison: none

REASON FOR EXAM: abd pain//rm 6
COMMENTS:

[Series 1: abdomen ultrasound · 17 of 68 slices shown]
[im 1/68]
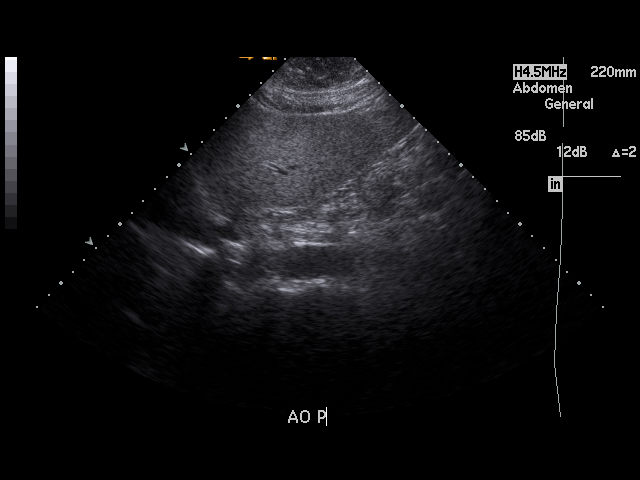
[im 6/68]
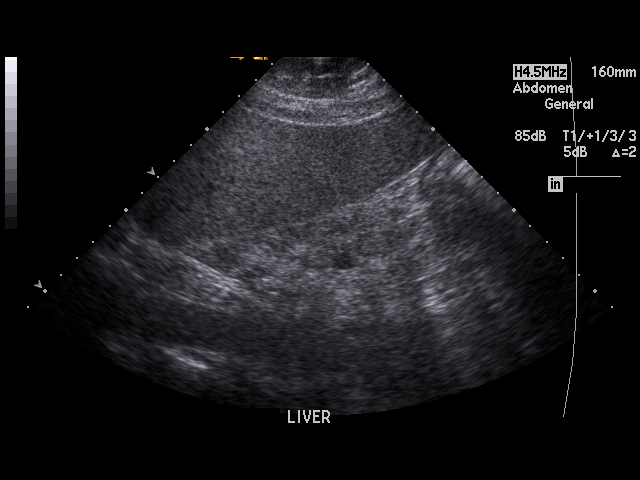
[im 9/68]
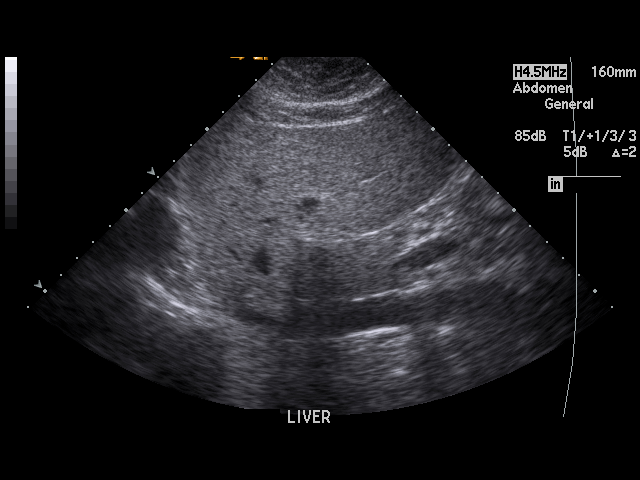
[im 14/68]
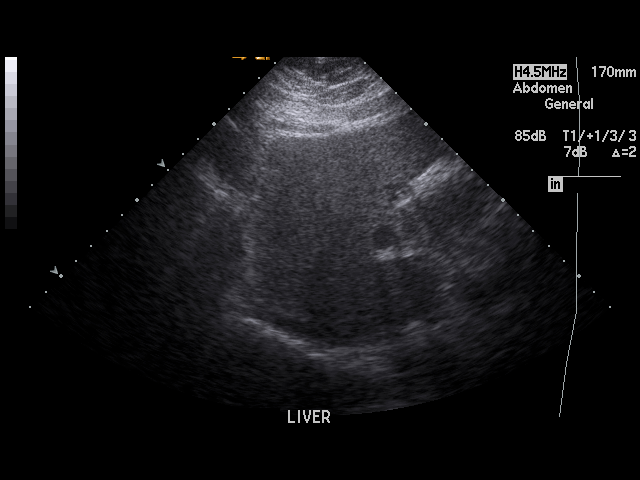
[im 17/68]
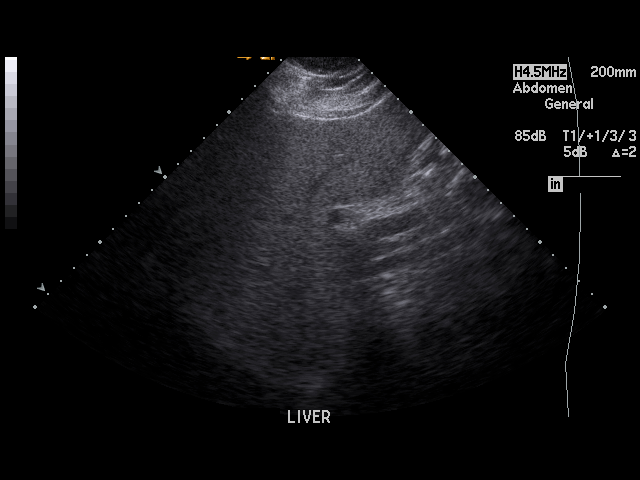
[im 23/68]
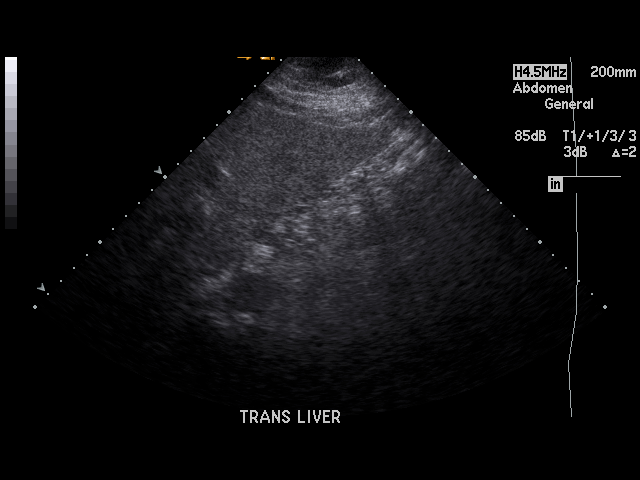
[im 26/68]
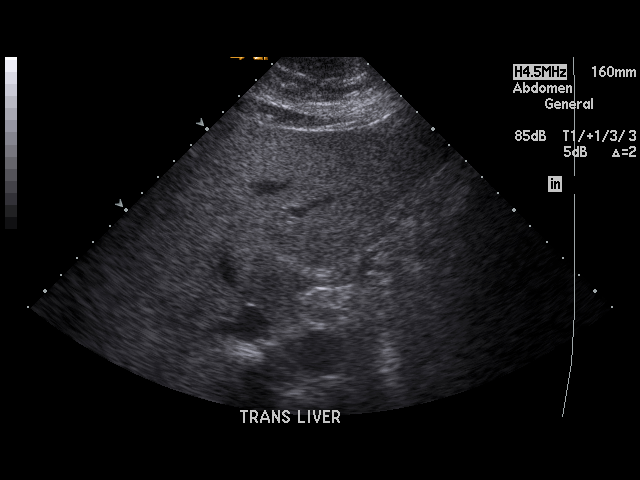
[im 31/68]
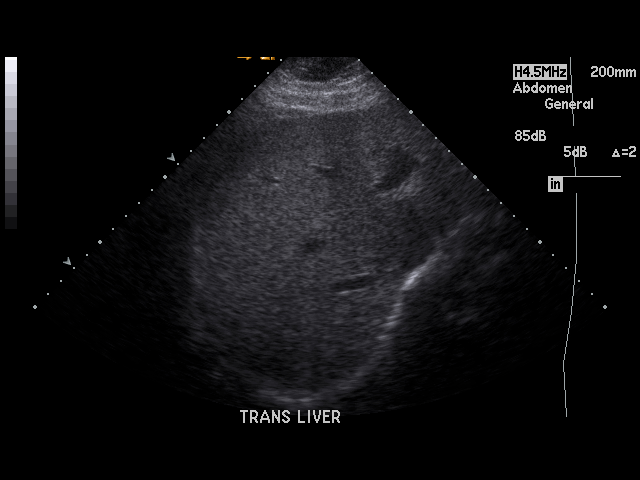
[im 34/68]
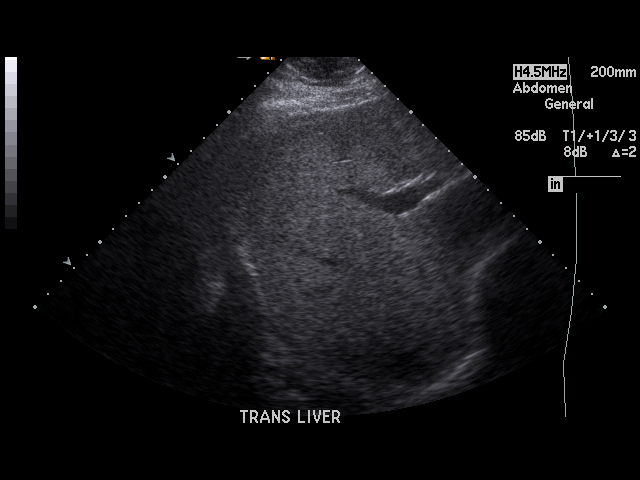
[im 37/68]
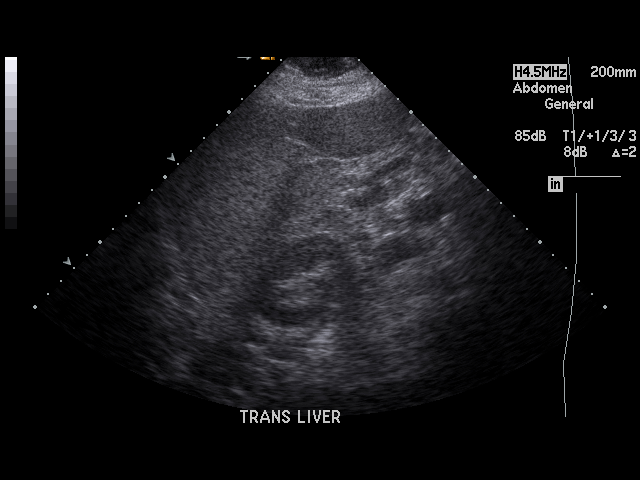
[im 42/68]
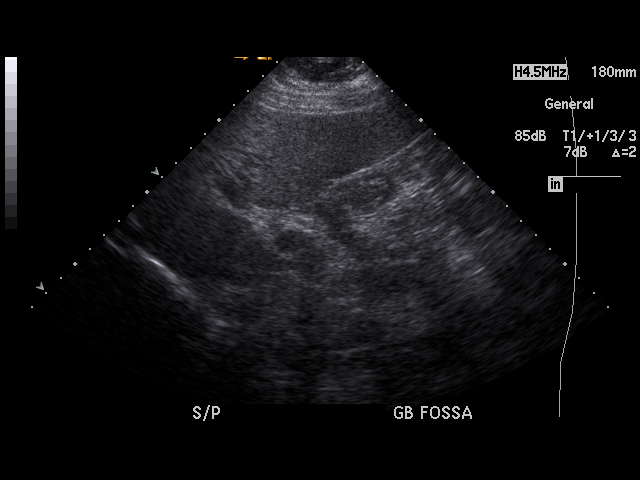
[im 45/68]
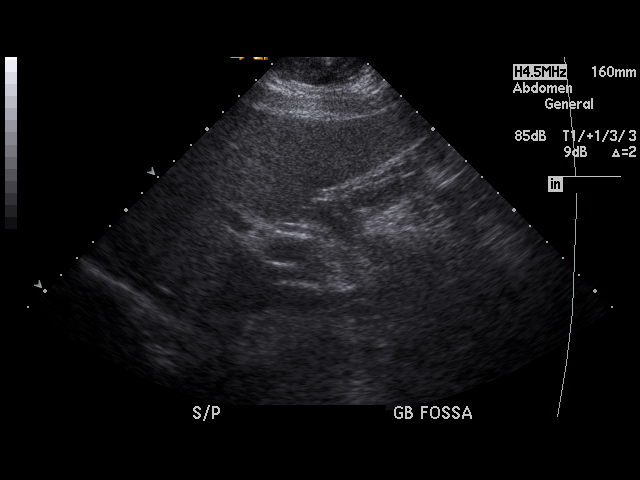
[im 51/68]
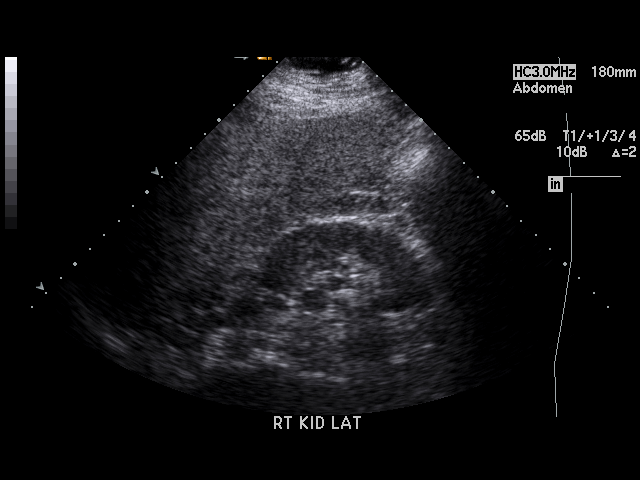
[im 54/68]
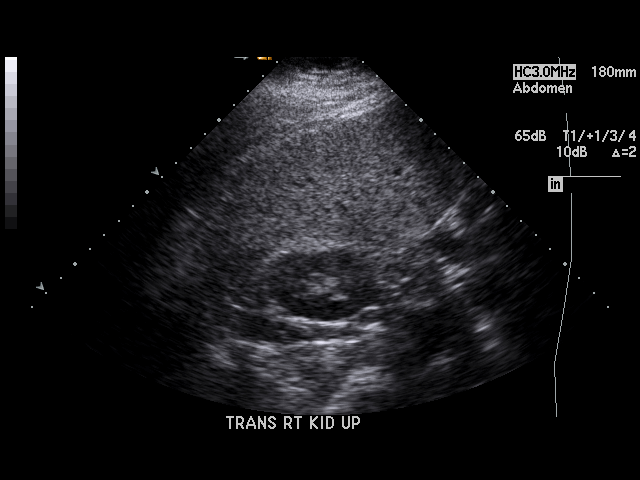
[im 59/68]
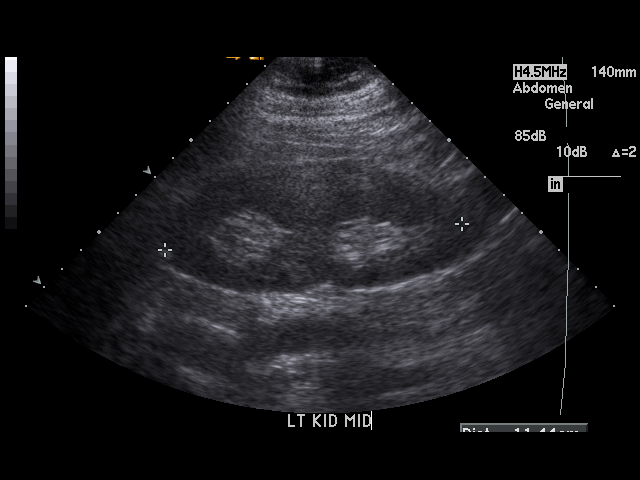
[im 62/68]
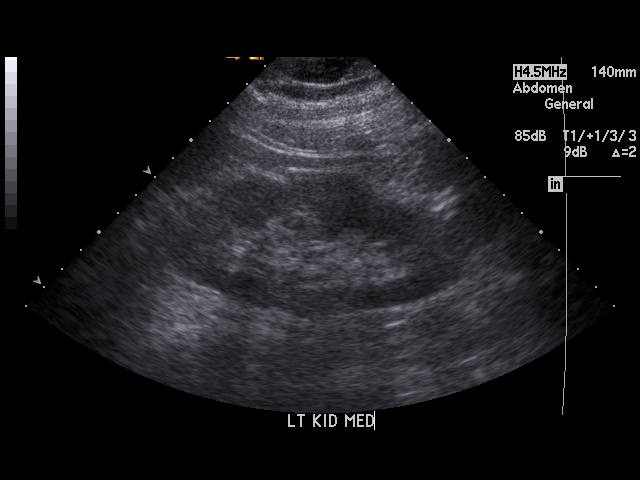
[im 68/68]
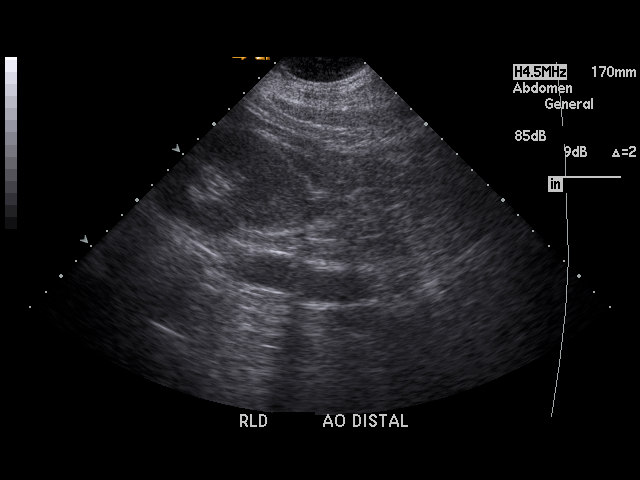

[17 of 25 positions shown; findings below may reference images not displayed]

PROCEDURE:     US  - US ABDOMEN GENERAL SURVEY  - May 23, 2006  [DATE]

RESULT:     Emergency sonographic evaluation of the abdomen demonstrates the
patient is status post cholecystectomy. The common bile duct diameter is
mm. There is fatty infiltration of the liver. There is no focal hepatic
mass. The visualized portions of the pancreas appear to be normal. The
spleen, kidneys and portal venous flow appear to be normal.
IMPRESSION: 1. Partially visualized pancreas appears to be normal.
2. Status post cholecystectomy.
3. Fatty infiltration the liver.

## 2007-07-28 IMAGING — CR DG CHEST 1V PORT
1 series · 1 of 1 positions shown · non-contrast
Comparison: none

REASON FOR EXAM: cp
COMMENTS:

PROCEDURE:     DXR - DXR PORTABLE CHEST SINGLE VIEW  - July 03, 2006  [DATE]
RESULT:     Comparison is made to study 30 June, 2005.
The lungs are adequately inflated. There is no focal infiltrate. The central
pulmonary vascularity is prominent but stable. The cardiac silhouette is not
enlarged.

[view not recorded]
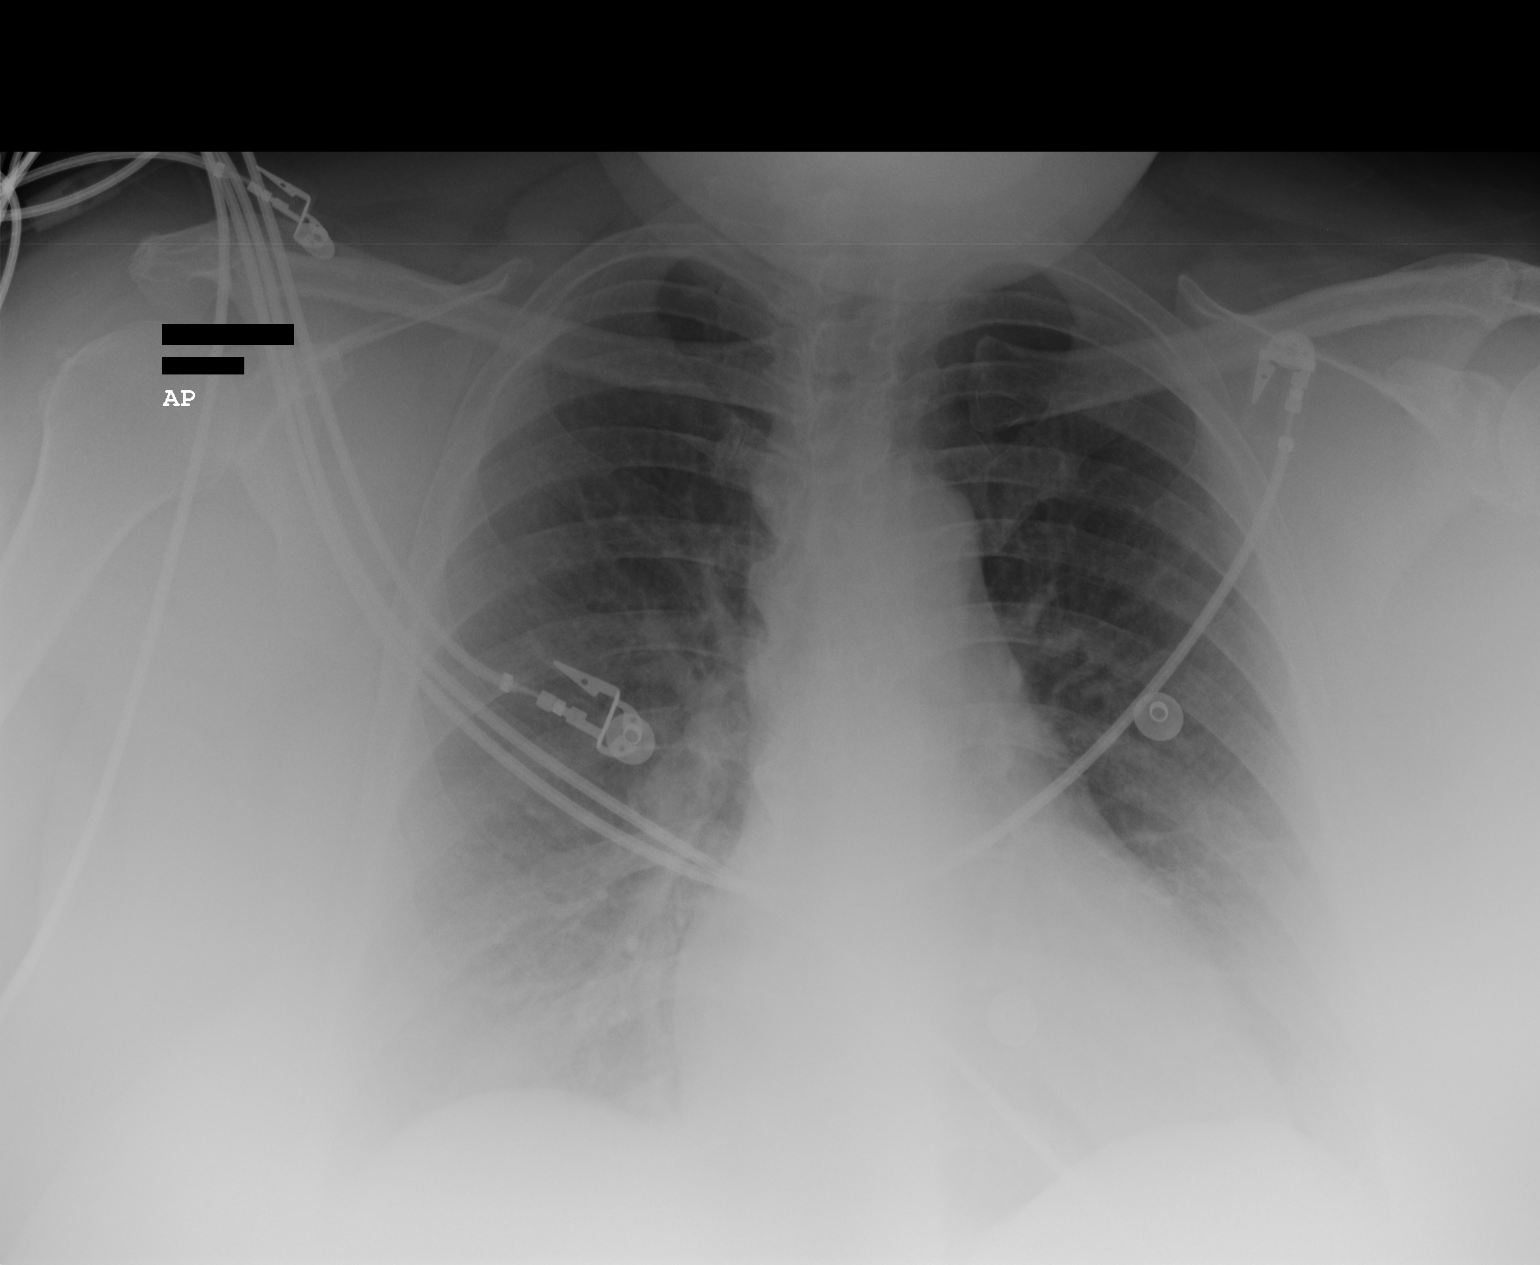

[1 of 1 positions shown; findings below may reference images not displayed]

IMPRESSION: 1.There are findings that may reflect an element of low grade compensated
CHF, but there has not been significant interval change since the prior
study. The technical quality is limited due to the patient's body habitus
and the portable technique. A follow-up PA and lateral chest x-ray would be
of value.

## 2007-08-24 ENCOUNTER — Other Ambulatory Visit: Payer: Self-pay

## 2007-08-24 ENCOUNTER — Inpatient Hospital Stay: Payer: Self-pay | Admitting: Internal Medicine

## 2007-10-06 ENCOUNTER — Emergency Department: Payer: Self-pay | Admitting: Unknown Physician Specialty

## 2007-10-22 IMAGING — CR DG CHEST 1V PORT
1 series · 1 of 1 positions shown · non-contrast
Comparison: Earlier exam today at [DATE] hours.

CLINICAL DATA: Respiratory distress. 
 PORTABLE CHEST ? 1 VIEW ([DATE] HOURS):

[view not recorded]
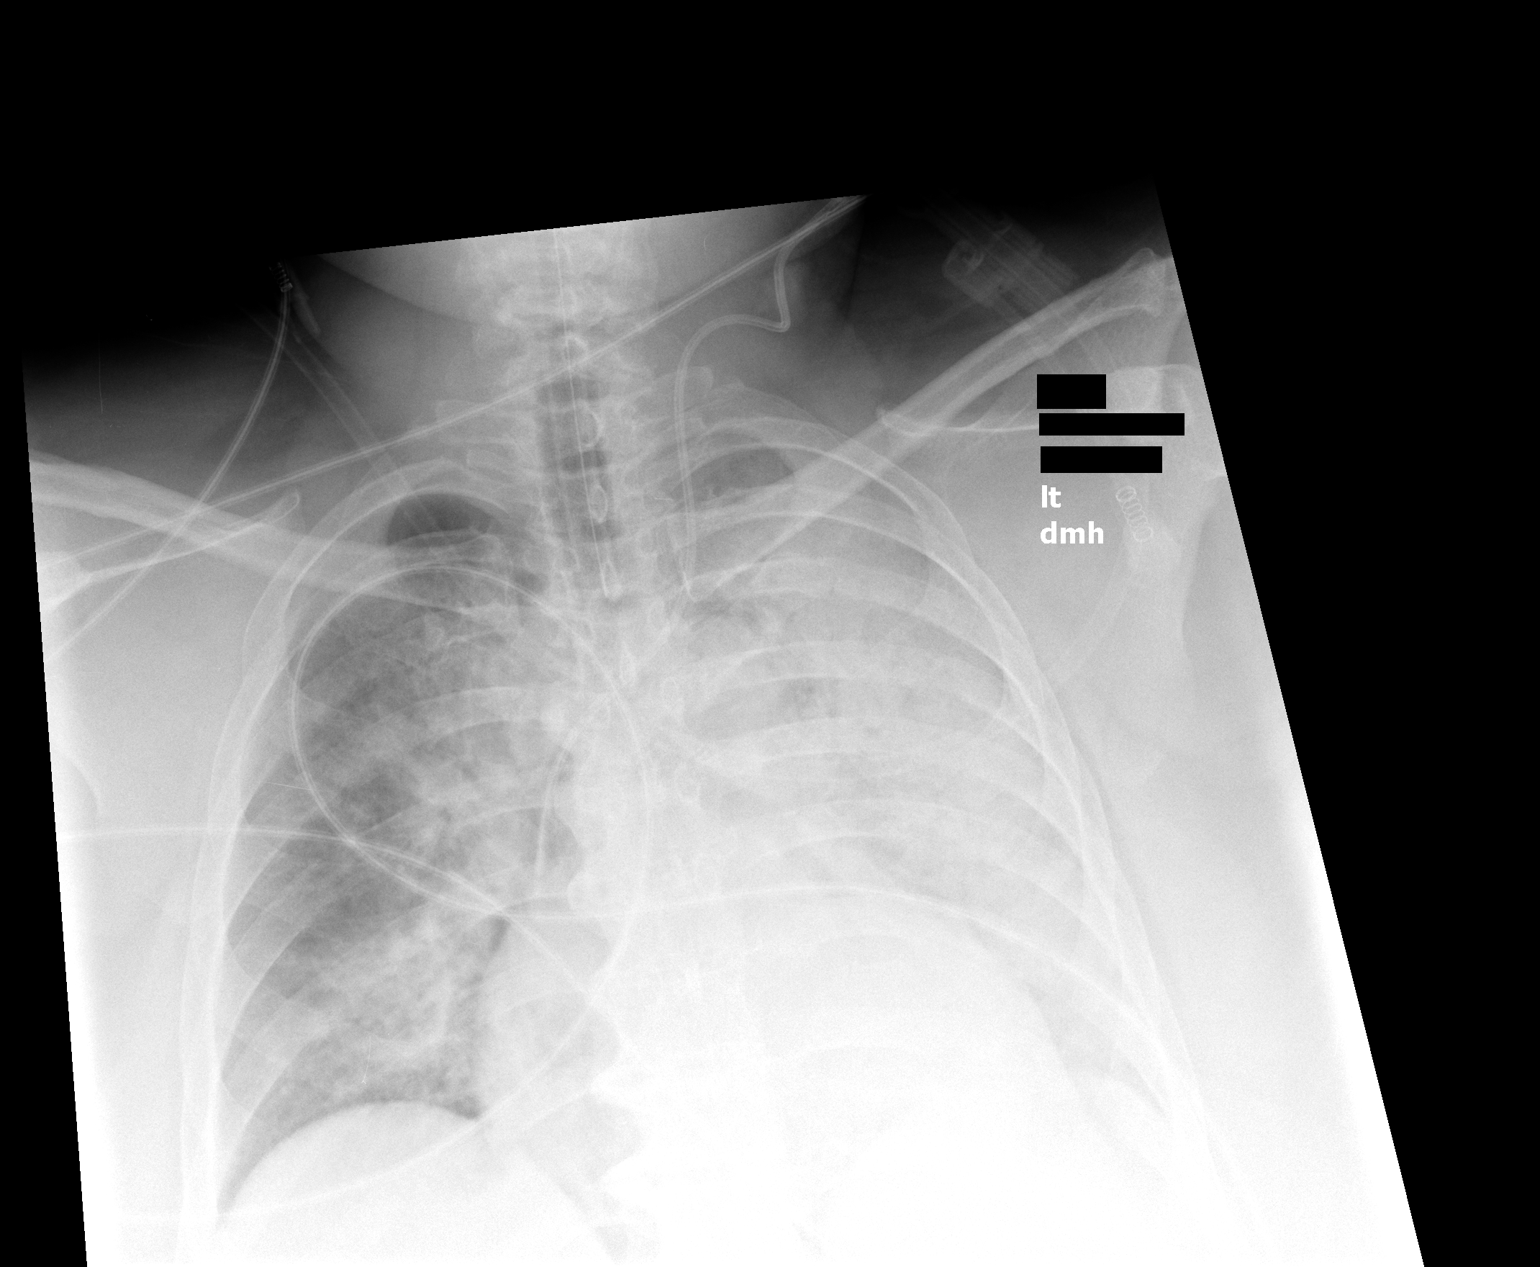

[1 of 1 positions shown; findings below may reference images not displayed]

FINDINGS: Increase in diffuse airspace disease predominately involving the left lung.  Left IJ CVC is in the lower SVC.  Satisfactory ETT position.  No pneumothorax.
IMPRESSION: Interval increase in diffuse airspace disease.

## 2007-10-22 IMAGING — CR DG CHEST 1V PORT
1 series · 1 of 1 positions shown · non-contrast
Comparison: 09/27/06 earlier study.

CLINICAL DATA: Respiratory distress.  Postoperative patient.  
 PORTABLE CHEST - 1 VIEW, 09/27/06, 4111 HOURS:

[view not recorded]
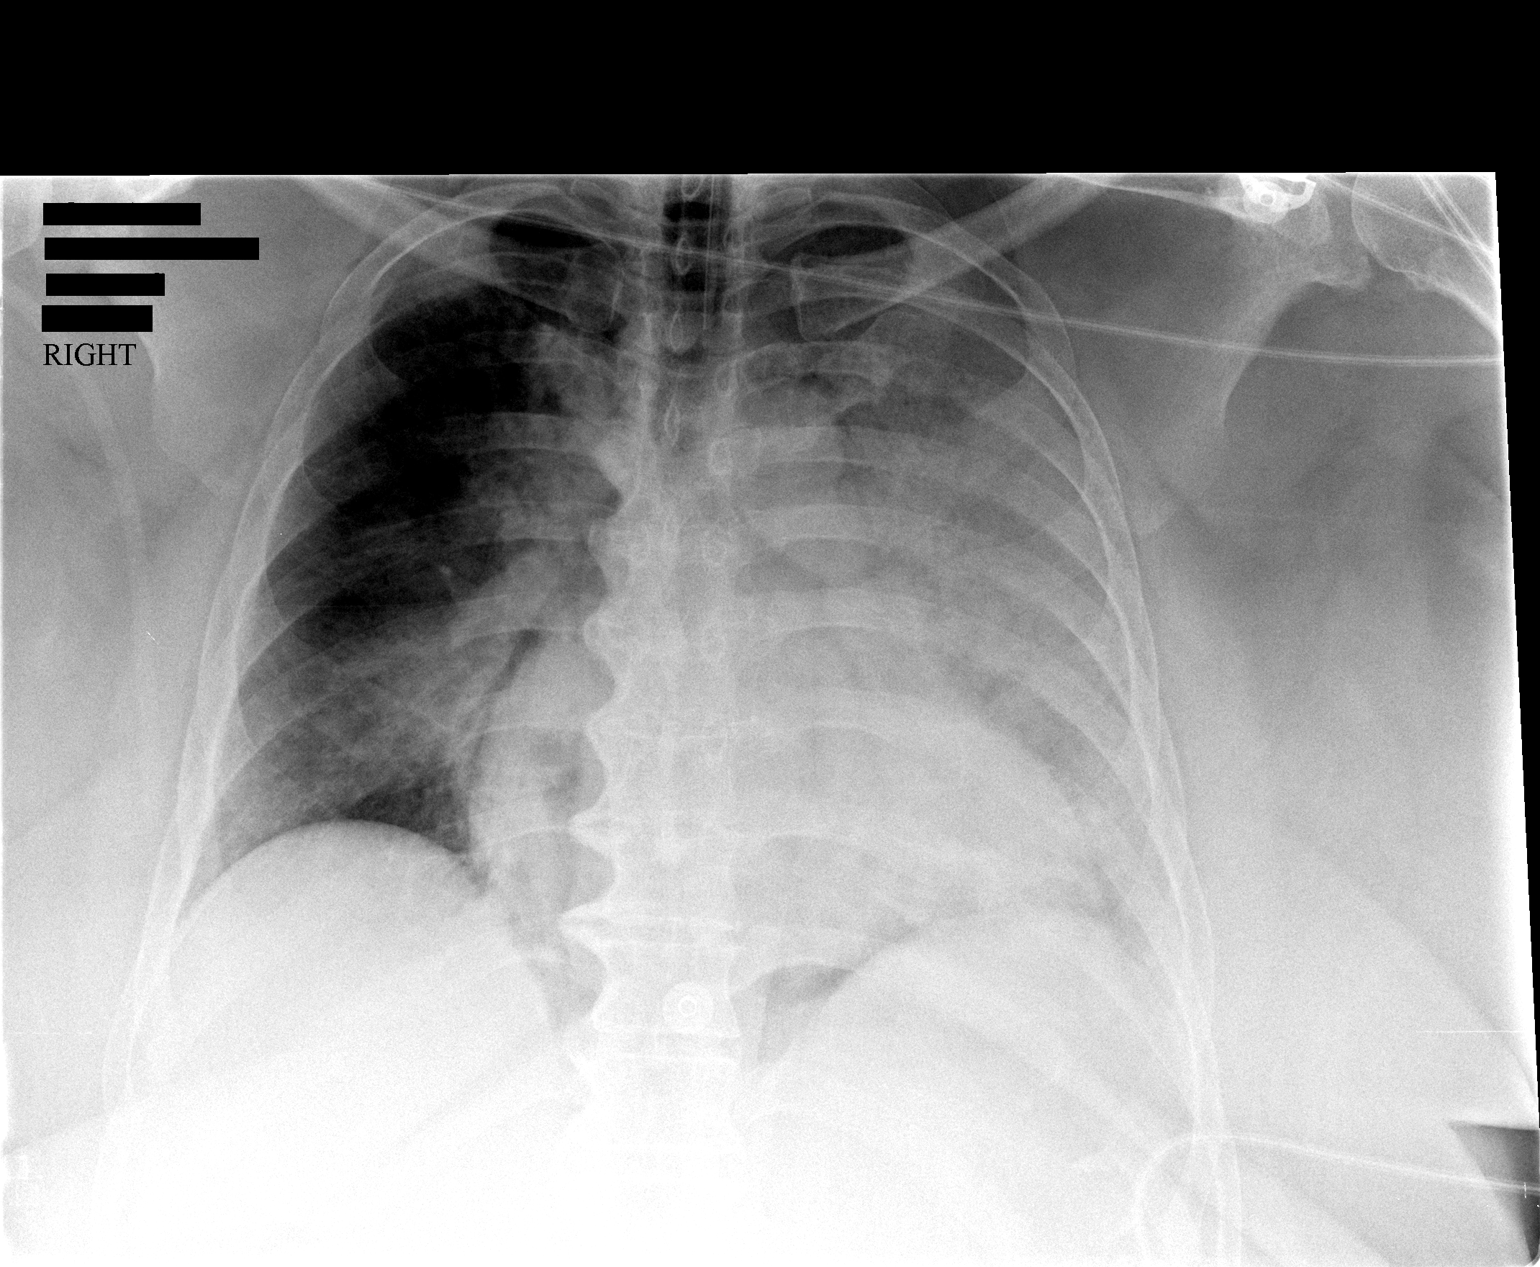

[1 of 1 positions shown; findings below may reference images not displayed]

FINDINGS: Endotracheal tube is present with the tip of the tube located 4.5 cm above the carina.  There is diffuse air space disease seen throughout the left lung and there is also air space disease seen in a right perihilar location.  These changes may be due to aspiration.  There is cardiomegaly again noted.
IMPRESSION: Diffuse air space disease within the left lung and also right perihilar air space disease.  This may be due to aspiration.

## 2007-10-22 IMAGING — CR DG CHEST 2V
2 series · 2 of 2 positions shown · non-contrast
Comparison: none

CLINICAL DATA: Carpal tunnel syndrome.  Preoperative chest.  History of sleep apnea, hypertension and congestive heart failure.  Smoker. 
 CHEST - 2 VIEW:

[view not recorded (1 of 2)]
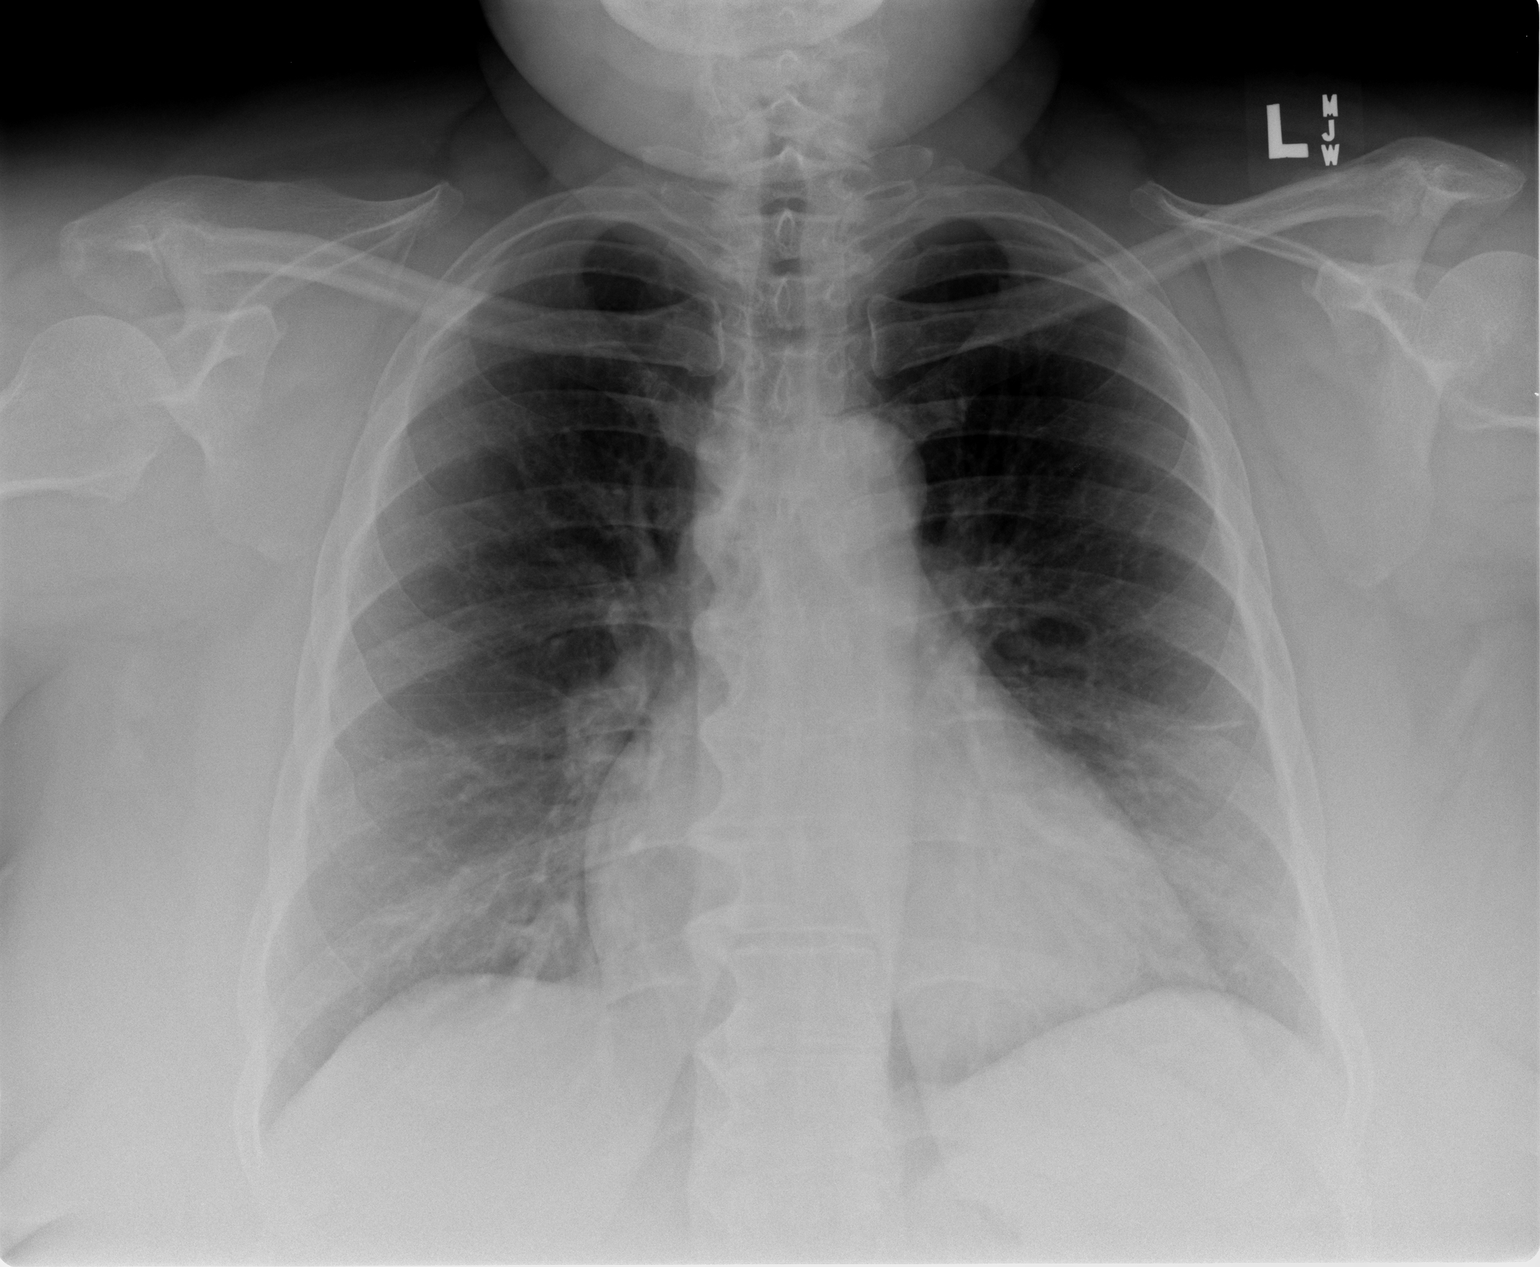

[view not recorded (2 of 2)]
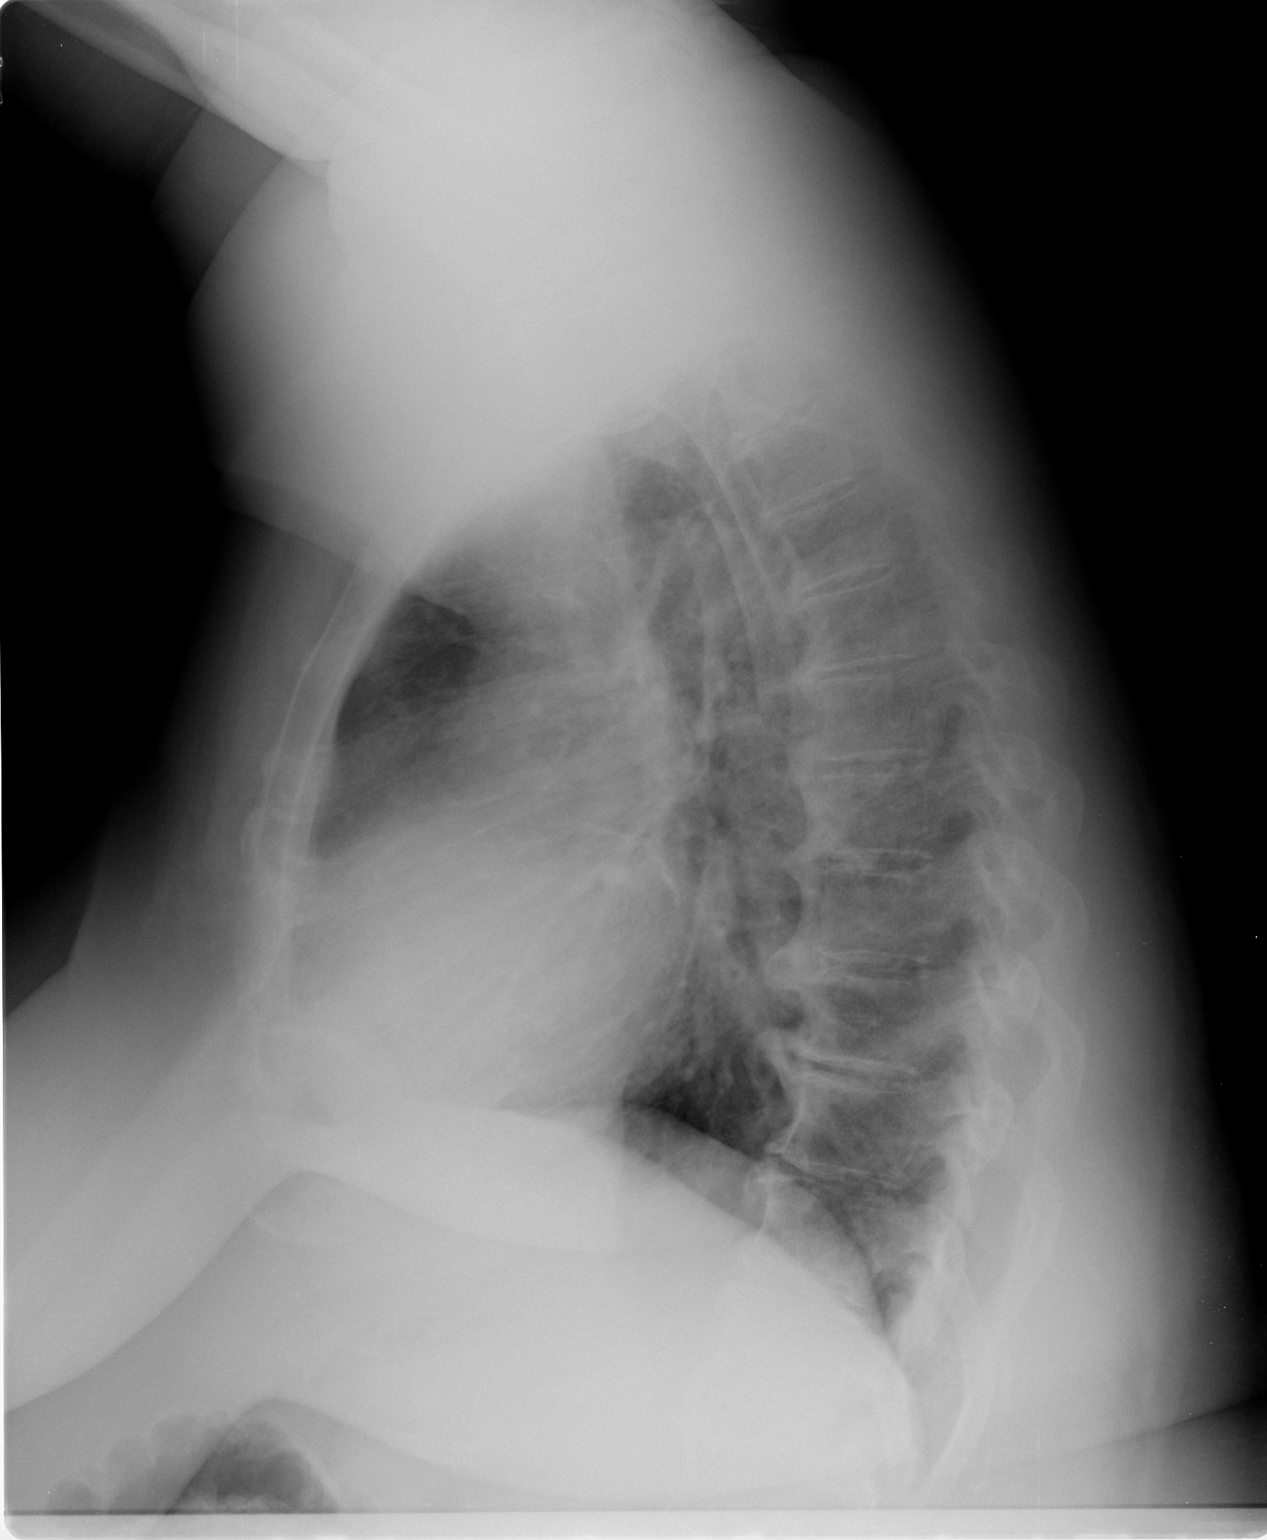

[2 of 2 positions shown; findings below may reference images not displayed]

FINDINGS: There is borderline cardiomegaly present.  There is an area of probable linear scarring within the left mid lung zone.  There are no infiltrative or edematous changes.  There is no evidence for adenopathy.  Thoracic osteophytosis noted.
IMPRESSION: Mild cardiomegaly.  No acute findings.

## 2007-10-23 IMAGING — CT CT CHEST W/O CM
2 of 3 series · 13 of 30 positions shown, 15 images · IV contrast (agent unspecified)
Comparison: None.

CLINICAL DATA: Postop from recent wrist surgery. Respiratory failure and worsening bilateral airspace disease.  
 CHEST CT WITHOUT CONTRAST:
TECHNIQUE: Multidetector CT imaging of the chest was performed following the standard protocol without IV contrast.

[Series 2: routine chest · axial · 0.70mm/px · z∈[-255,-70]mm · 5 of 57 slices shown, 7 images]
[im 10/57  mediastinal]
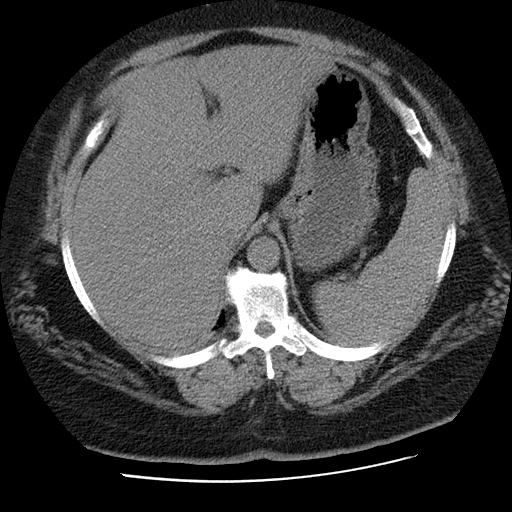
[im 10/57  lung]
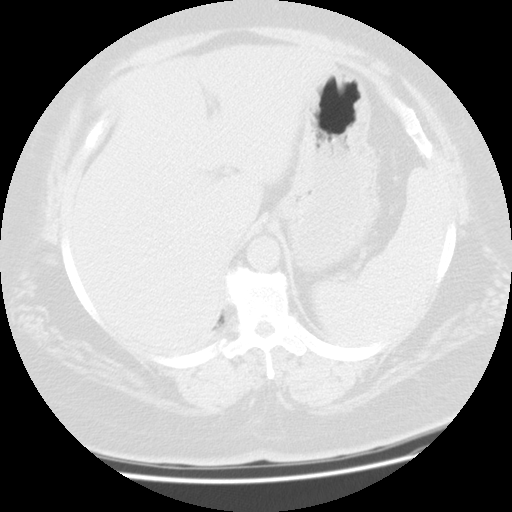
[im 19/57  lung]
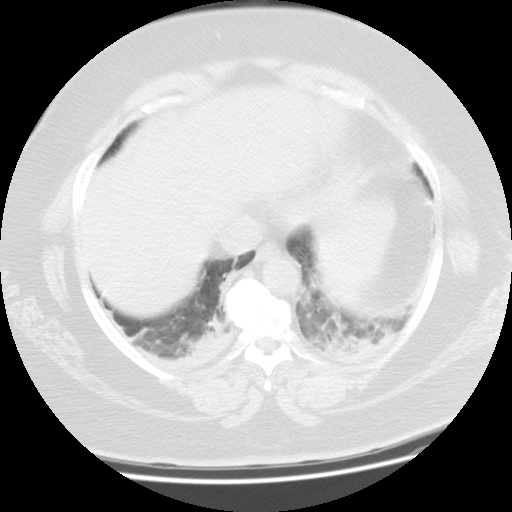
[im 29/57  lung]
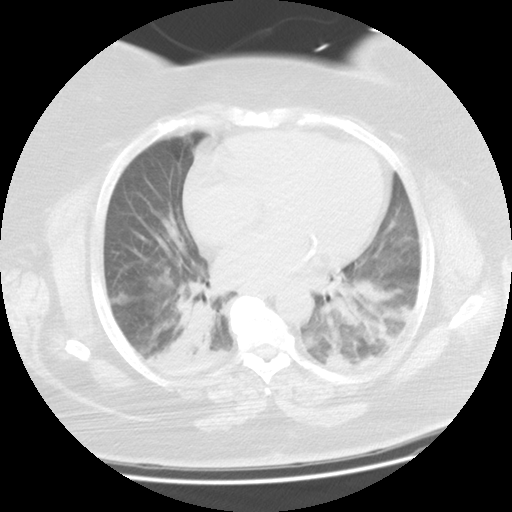
[im 38/57  lung]
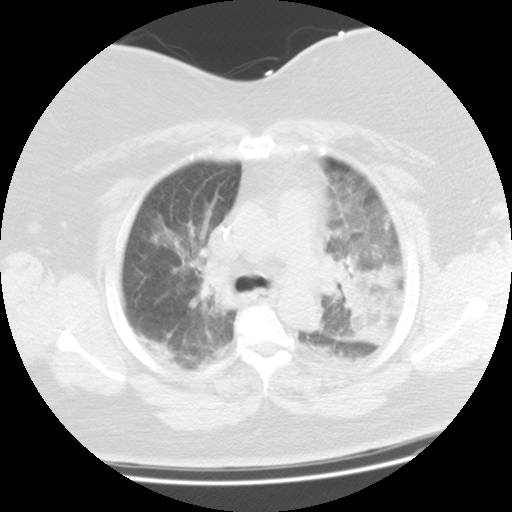
[im 47/57  mediastinal]
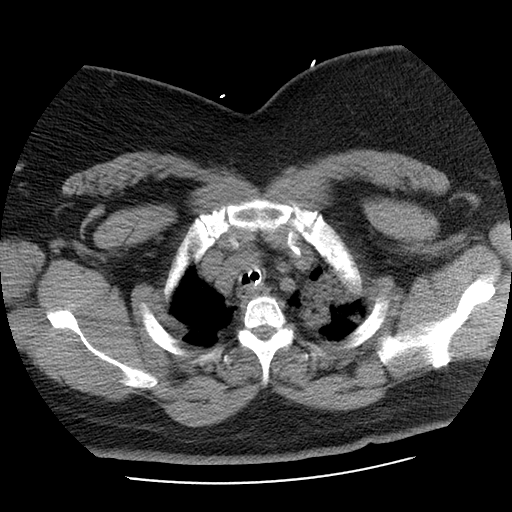
[im 47/57  lung]
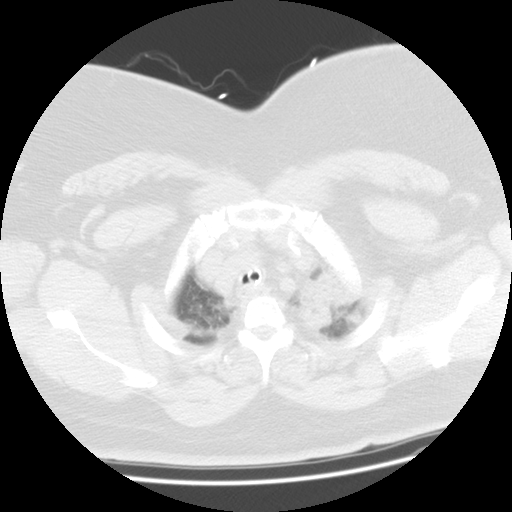

[Series 401: cor · coronal · 0.70mm/px · 8 of 158 slices shown]
[im 16/158  lung]
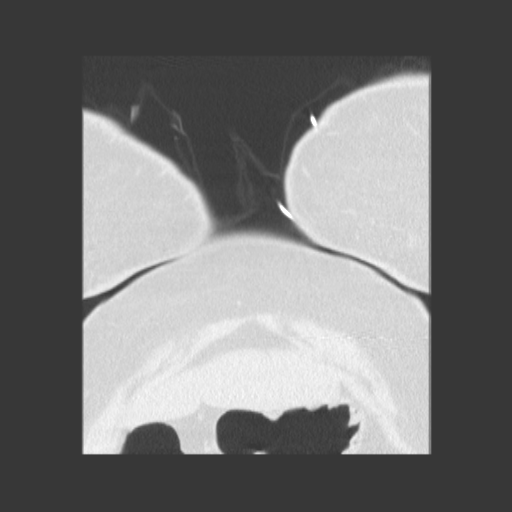
[im 32/158  lung]
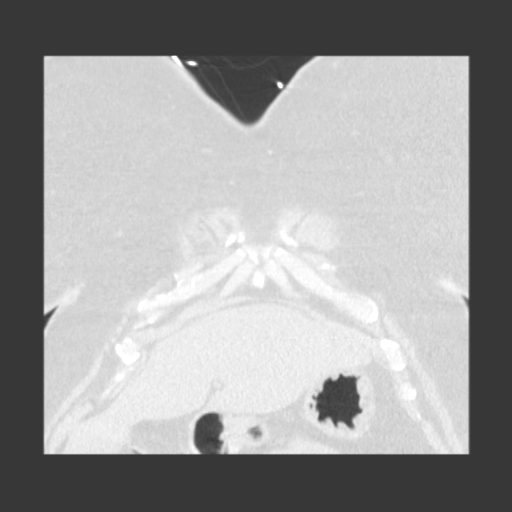
[im 48/158  lung]
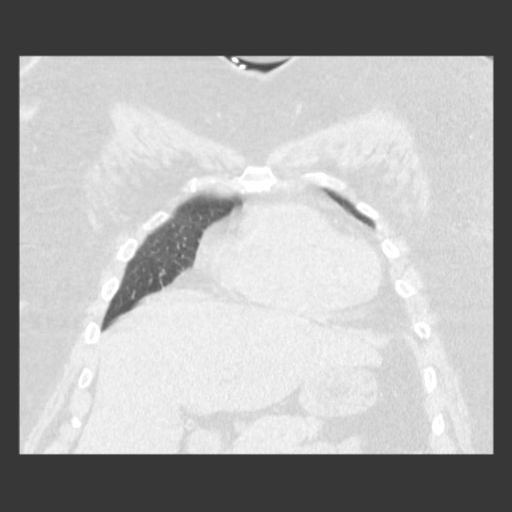
[im 71/158  lung]
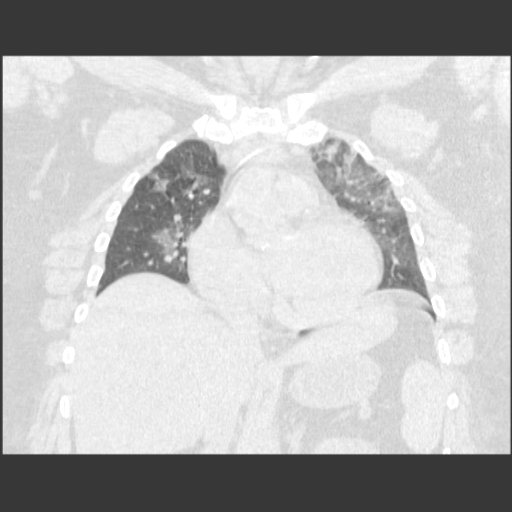
[im 87/158  lung]
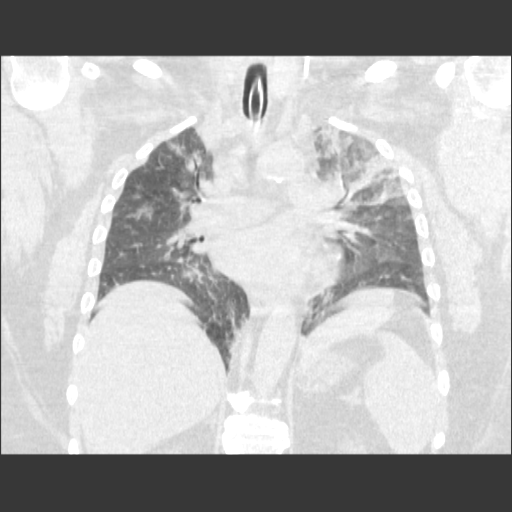
[im 110/158  lung]
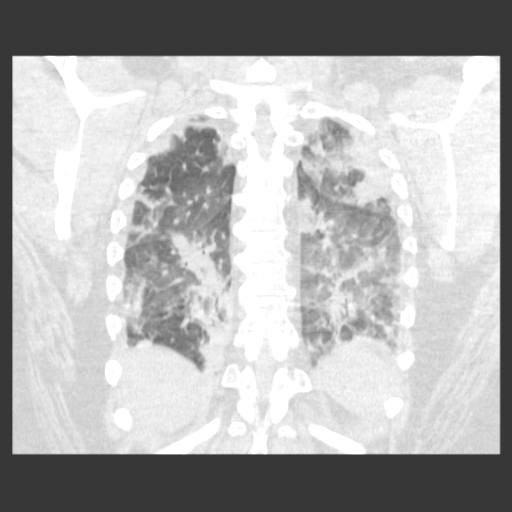
[im 126/158  lung]
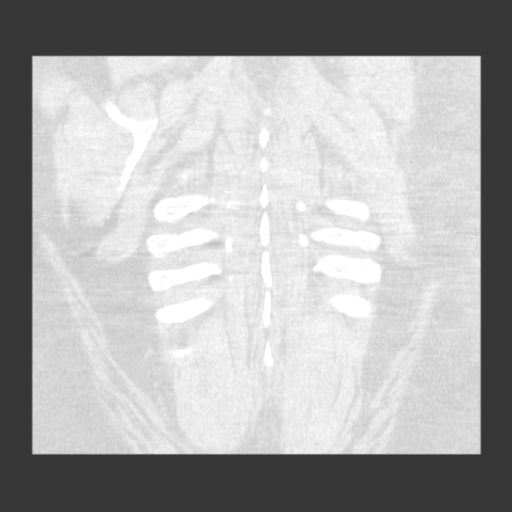
[im 142/158  lung]
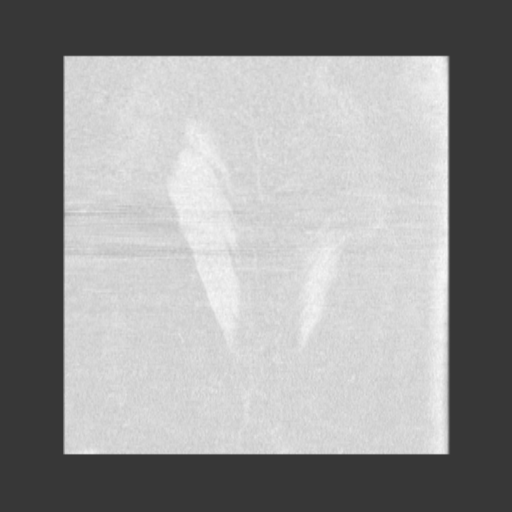

[13 of 30 positions shown; findings below may reference images not displayed]

FINDINGS: Endotracheal tube is seen in appropriate position.  Diffuse pulmonary airspace disease is seen throughout both lungs in a heterogeneous pattern.   There is no evidence of discrete pulmonary mass and the central tracheobronchial airways are patent.  No centrally obstructing hilar mass is identified.   There is no evidence of pleural or pericardial effusion.
IMPRESSION: 1.  Diffuse heterogeneous bilateral pulmonary airspace disease, which may be due to edema, infection or ARDS.  
 2.  No evidence of centrally obstructing mass or pleural effusion.

## 2007-10-23 IMAGING — CR DG CHEST 1V PORT
1 series · 1 of 1 positions shown · non-contrast
Comparison: 09/27/06.

CLINICAL DATA: Respiratory distress.  Carpal tunnel syndrome.
 PORTABLE CHEST ? 1 VIEW ([DATE] HOURS):

[view not recorded]
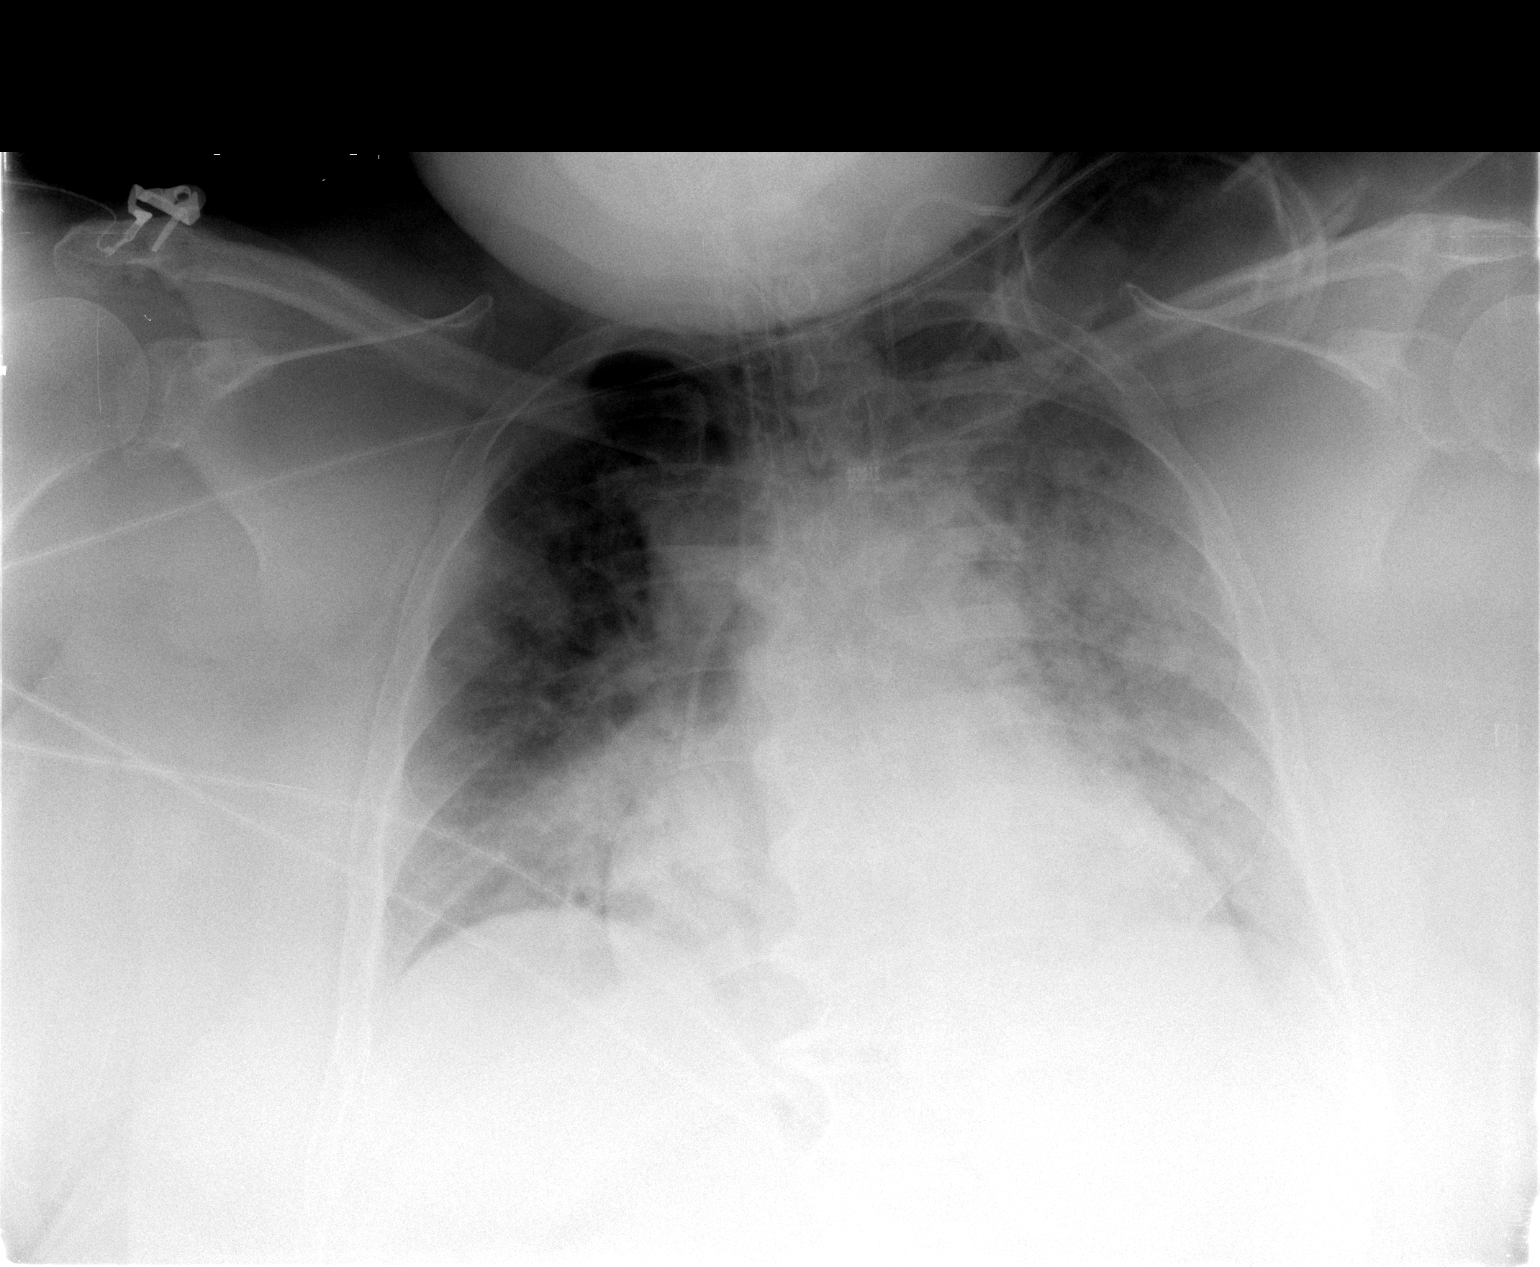

[1 of 1 positions shown; findings below may reference images not displayed]

FINDINGS: Endotracheal tube has its tip 3.5 cm above the carina.  Central line is unchanged with its tip in the SVC above the right atrium.  Widespread pulmonary opacity persists, more extensive in the left lung than the right.  Compared to yesterday, I think there is slight improvement.  No areas of worsening or any new abnormalities are seen.
IMPRESSION: Slight improvement in lung density.

## 2007-10-24 IMAGING — CR DG CHEST 1V PORT
1 series · 1 of 1 positions shown · non-contrast
Comparison: 09/28/06.

CLINICAL DATA: Follow-up ventilator support. Airspace density.
 PORTABLE CHEST - 1 VIEW (9959 hours):

[view not recorded]
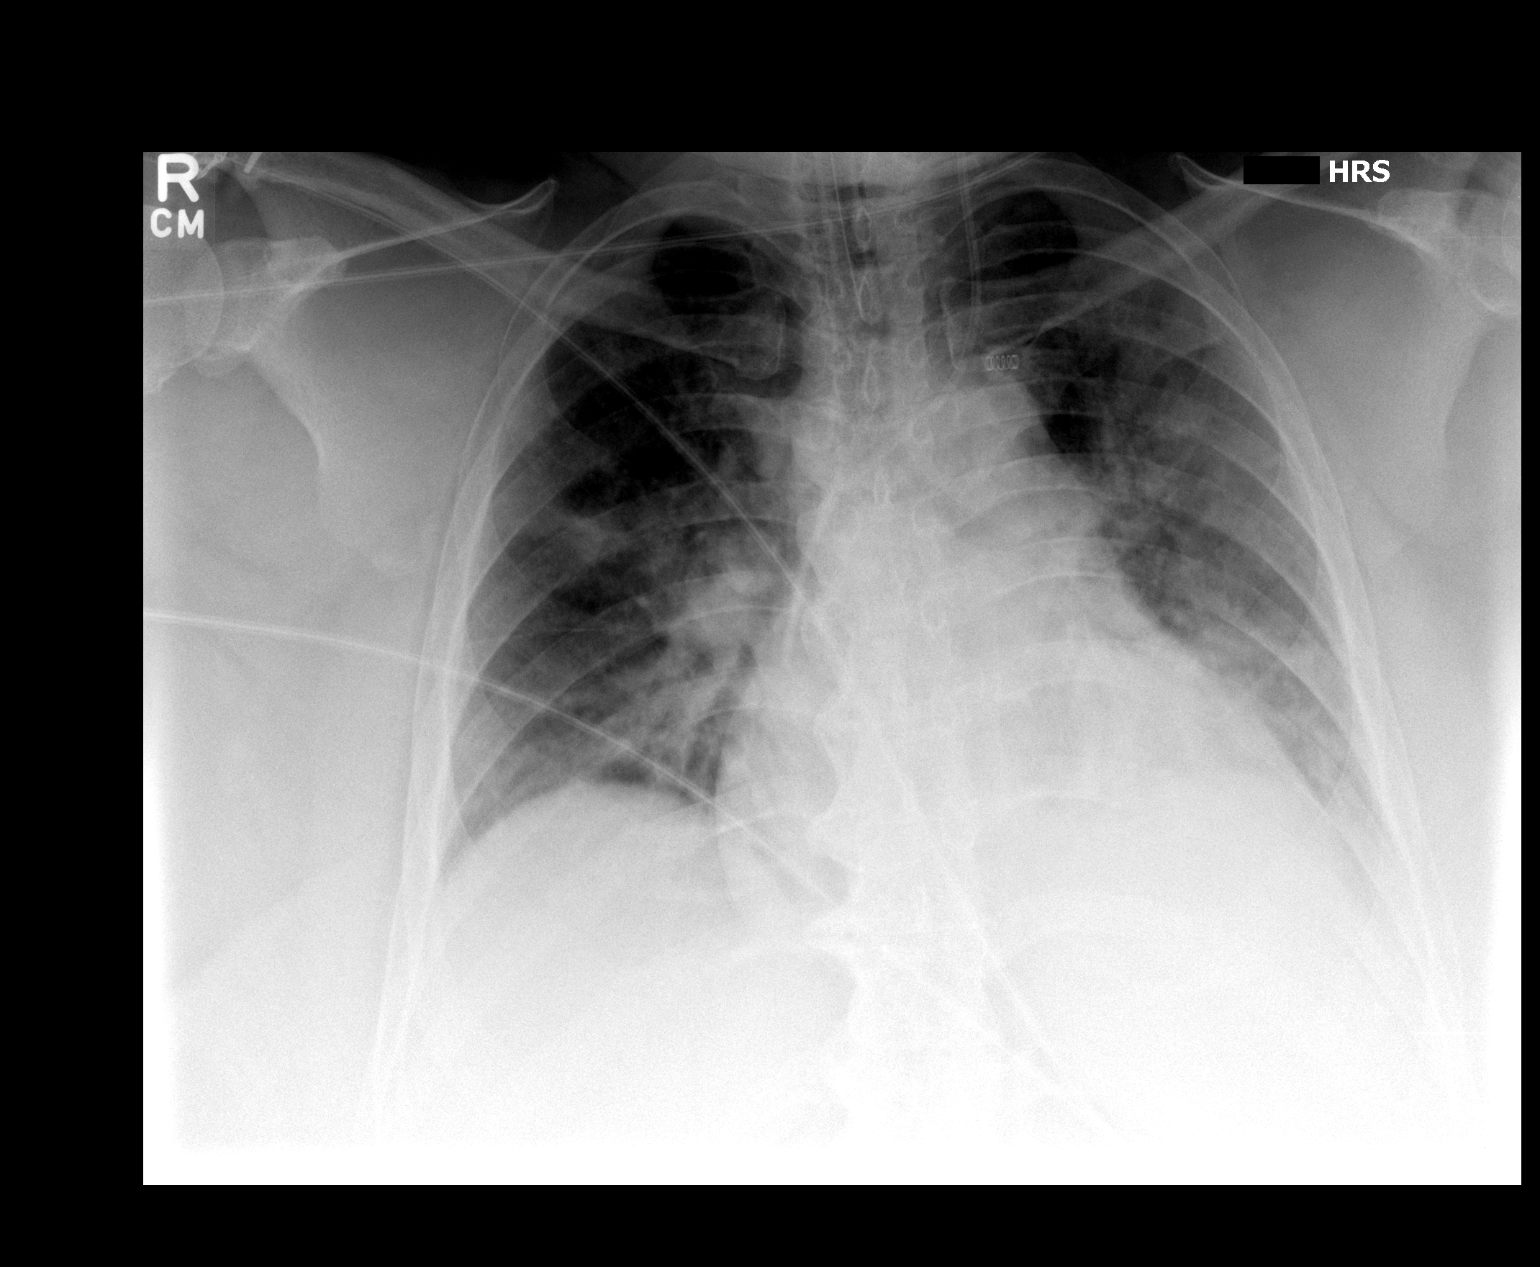

[1 of 1 positions shown; findings below may reference images not displayed]

FINDINGS: Endotracheal tube has its tip 3 ? cm above the carina. Central line has the tip in the SVC. Bilateral airspace density persists, left more than right, but there is improvement.  No areas of worsening or any new abnormalities are seen.
IMPRESSION: As discussed above.

## 2007-10-26 IMAGING — CR DG CHEST 2V
2 series · 2 of 2 positions shown · non-contrast
Comparison: 09/29/06.

CLINICAL DATA: Chest pain, short of breath.
 CHEST ? 2 VIEW:

[w chest pa *]
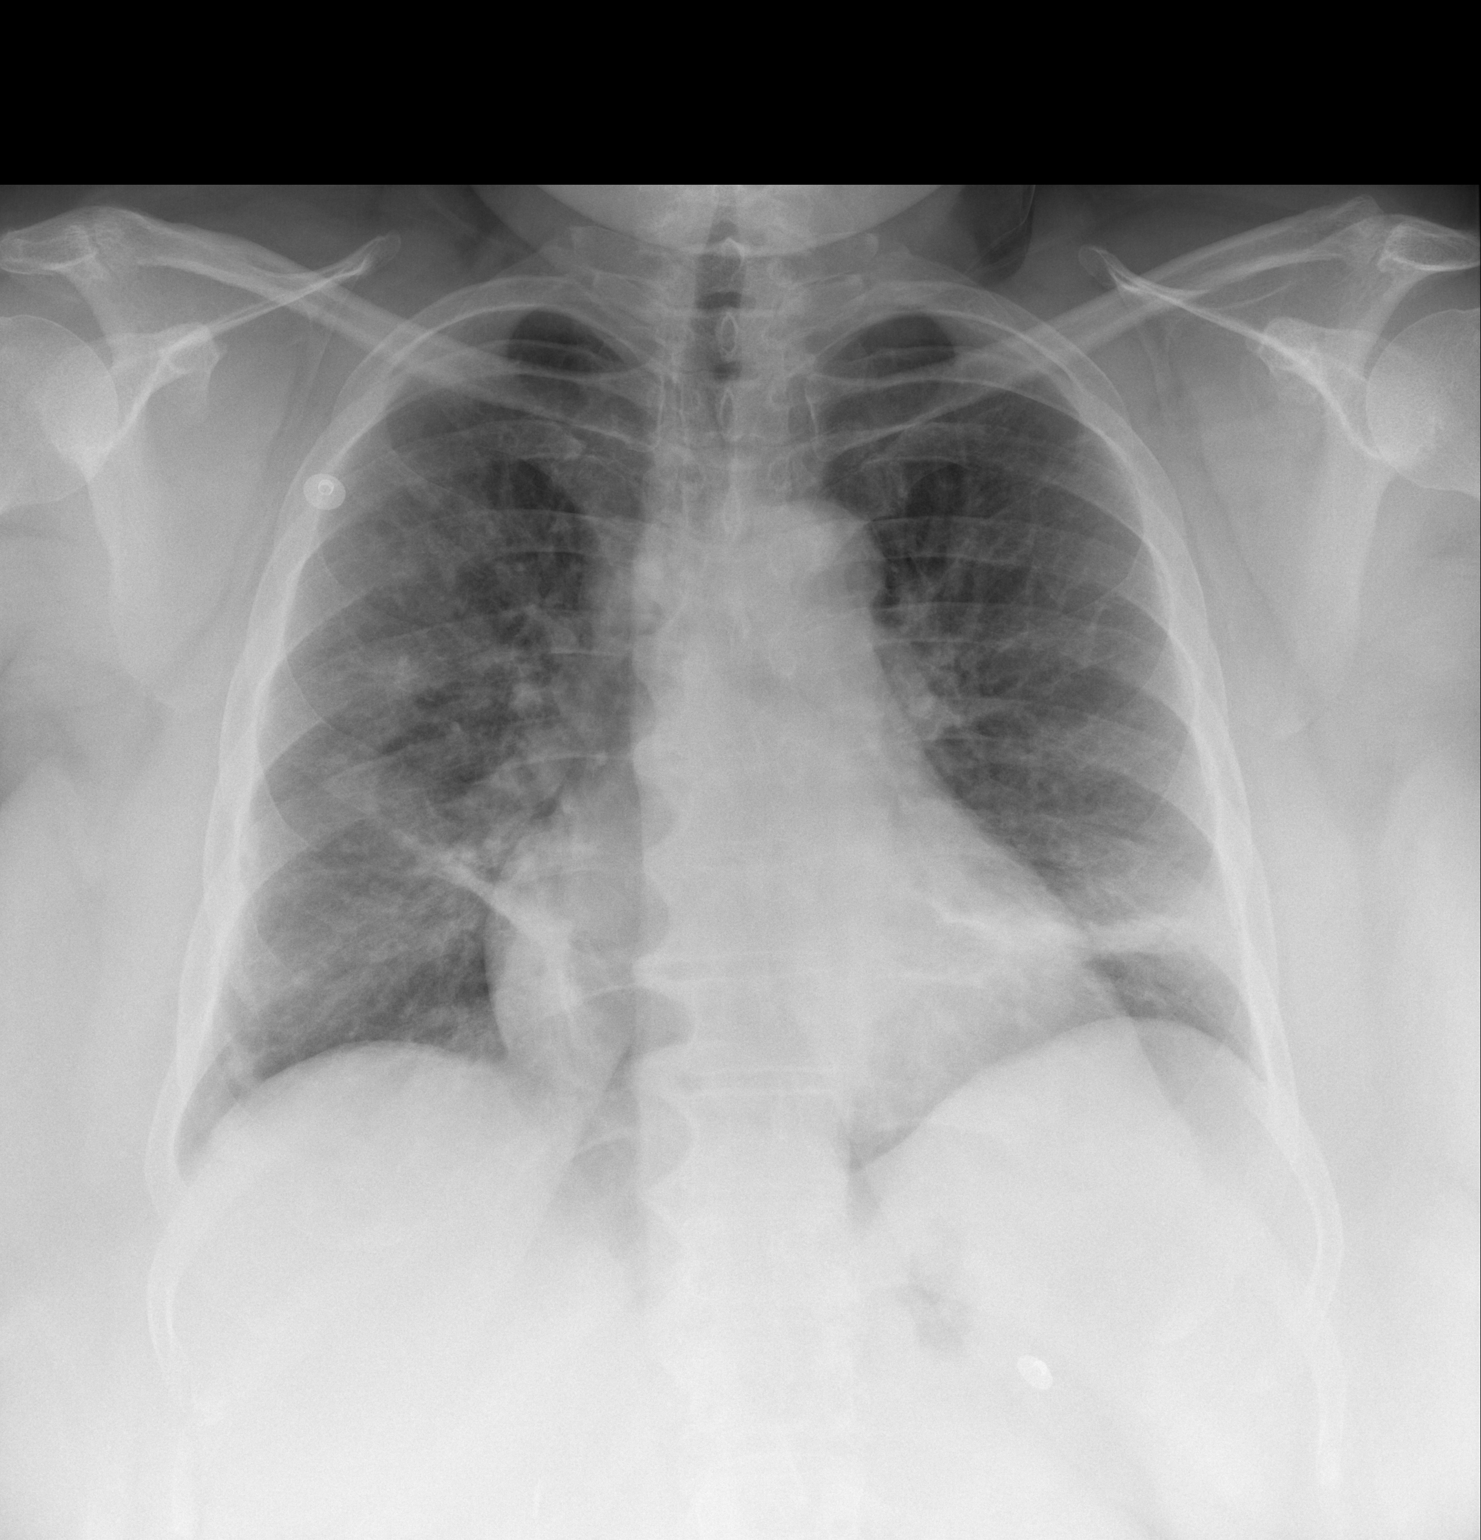

[w chest lat *]
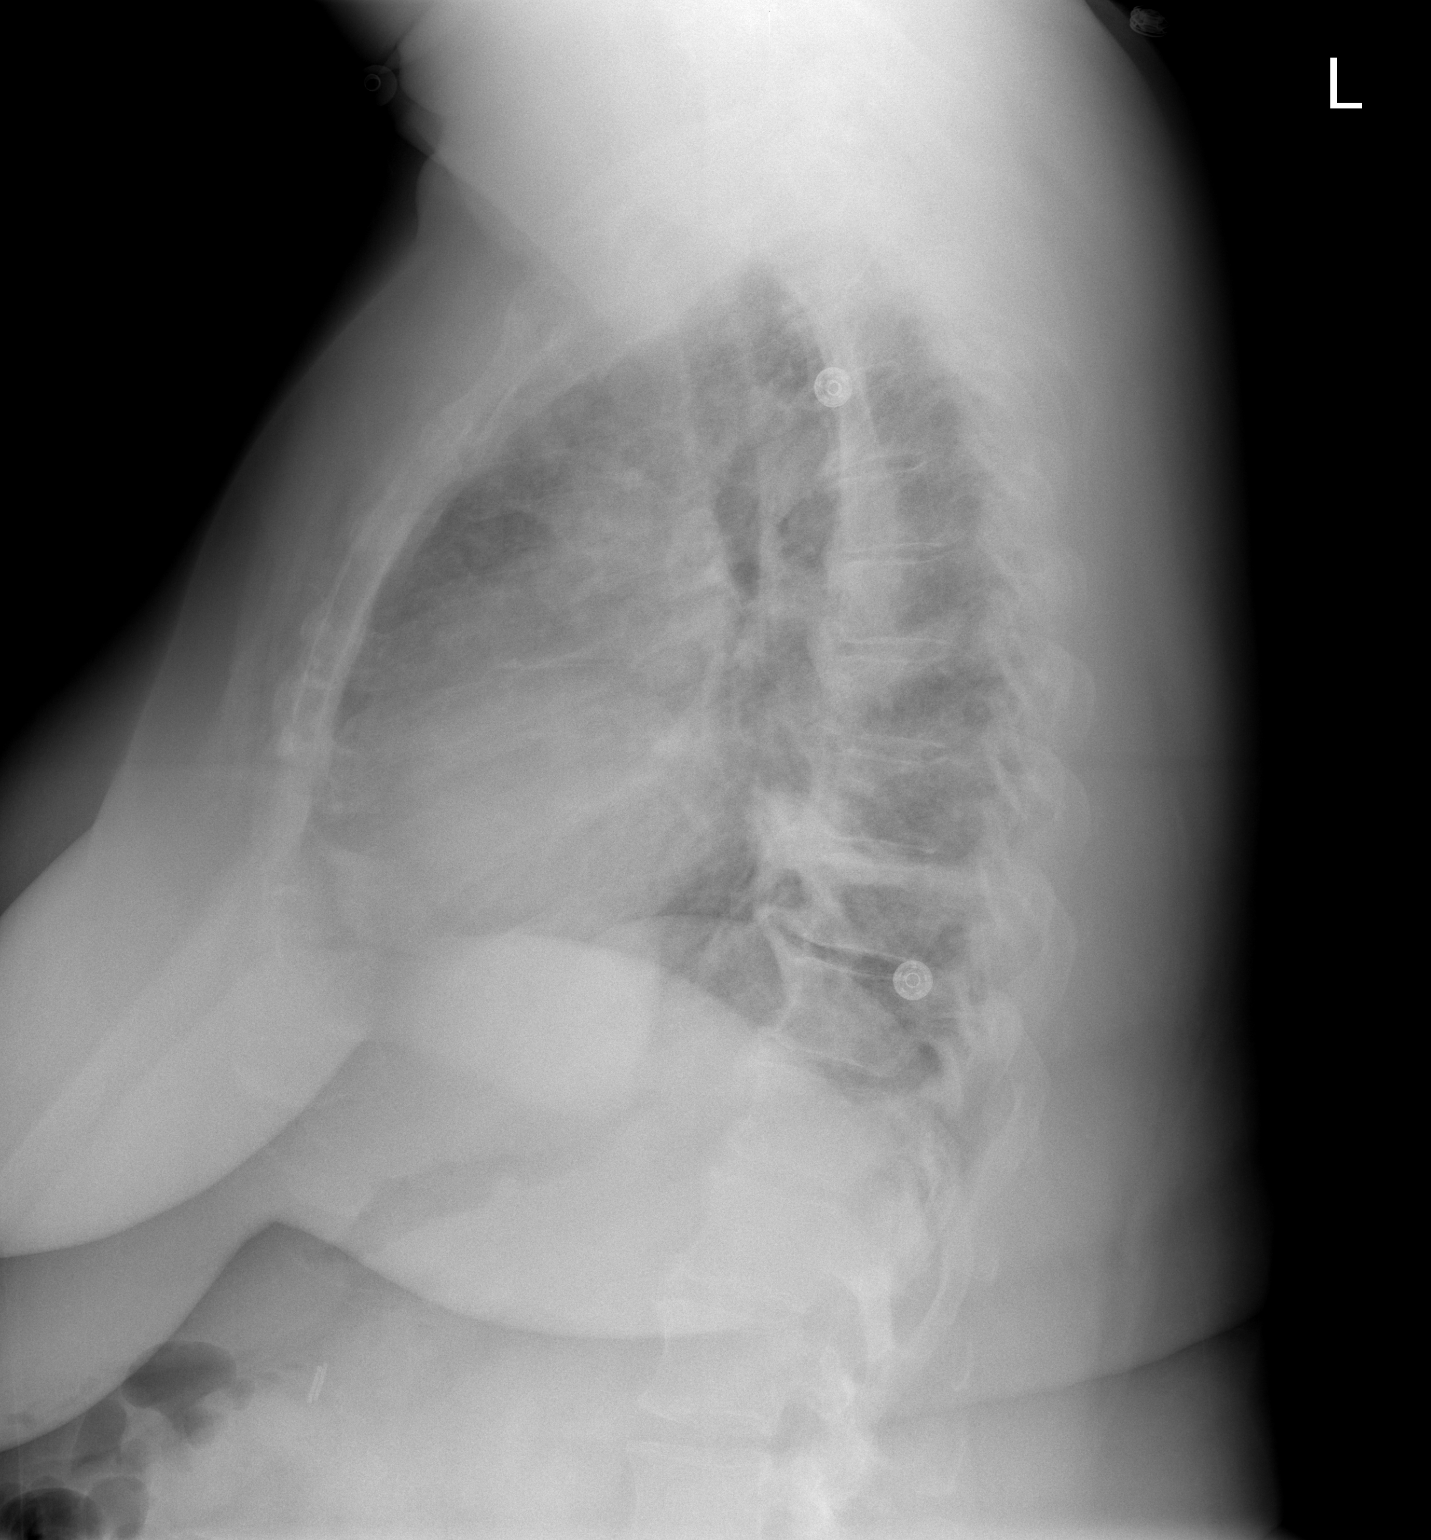

[2 of 2 positions shown; findings below may reference images not displayed]

FINDINGS: Two views of the chest show cardiomegaly and mild congestion.  Basilar atelectasis is present, but aeration has improved.  No effusion is seen.
IMPRESSION: 1. Cardiomegaly and mild congestion.  
 2. Bibasilar linear atelectasis.

## 2007-11-21 ENCOUNTER — Ambulatory Visit: Payer: Self-pay | Admitting: Internal Medicine

## 2008-01-01 ENCOUNTER — Ambulatory Visit: Payer: Self-pay | Admitting: Pain Medicine

## 2008-05-13 ENCOUNTER — Emergency Department: Payer: Self-pay | Admitting: Internal Medicine

## 2008-08-12 ENCOUNTER — Emergency Department: Payer: Self-pay | Admitting: Emergency Medicine

## 2008-08-19 ENCOUNTER — Emergency Department: Payer: Self-pay | Admitting: Emergency Medicine

## 2008-08-25 ENCOUNTER — Emergency Department: Payer: Self-pay | Admitting: Emergency Medicine

## 2008-09-17 IMAGING — CR DG CHEST 2V
1 series · 2 of 2 positions shown · non-contrast
Comparison: none

REASON FOR EXAM: Chest pain
COMMENTS:

PROCEDURE:     DXR - DXR CHEST PA (OR AP) AND LATERAL  - August 24, 2007  [DATE]
RESULT:     No acute cardiopulmonary disease is identified. Degenerative
changes are noted in the thoracic spine. Cardiomegaly is present.

[Series 1: view not recorded · 0.17mm/px · 2 of 2 slices shown]
[im 1/2]
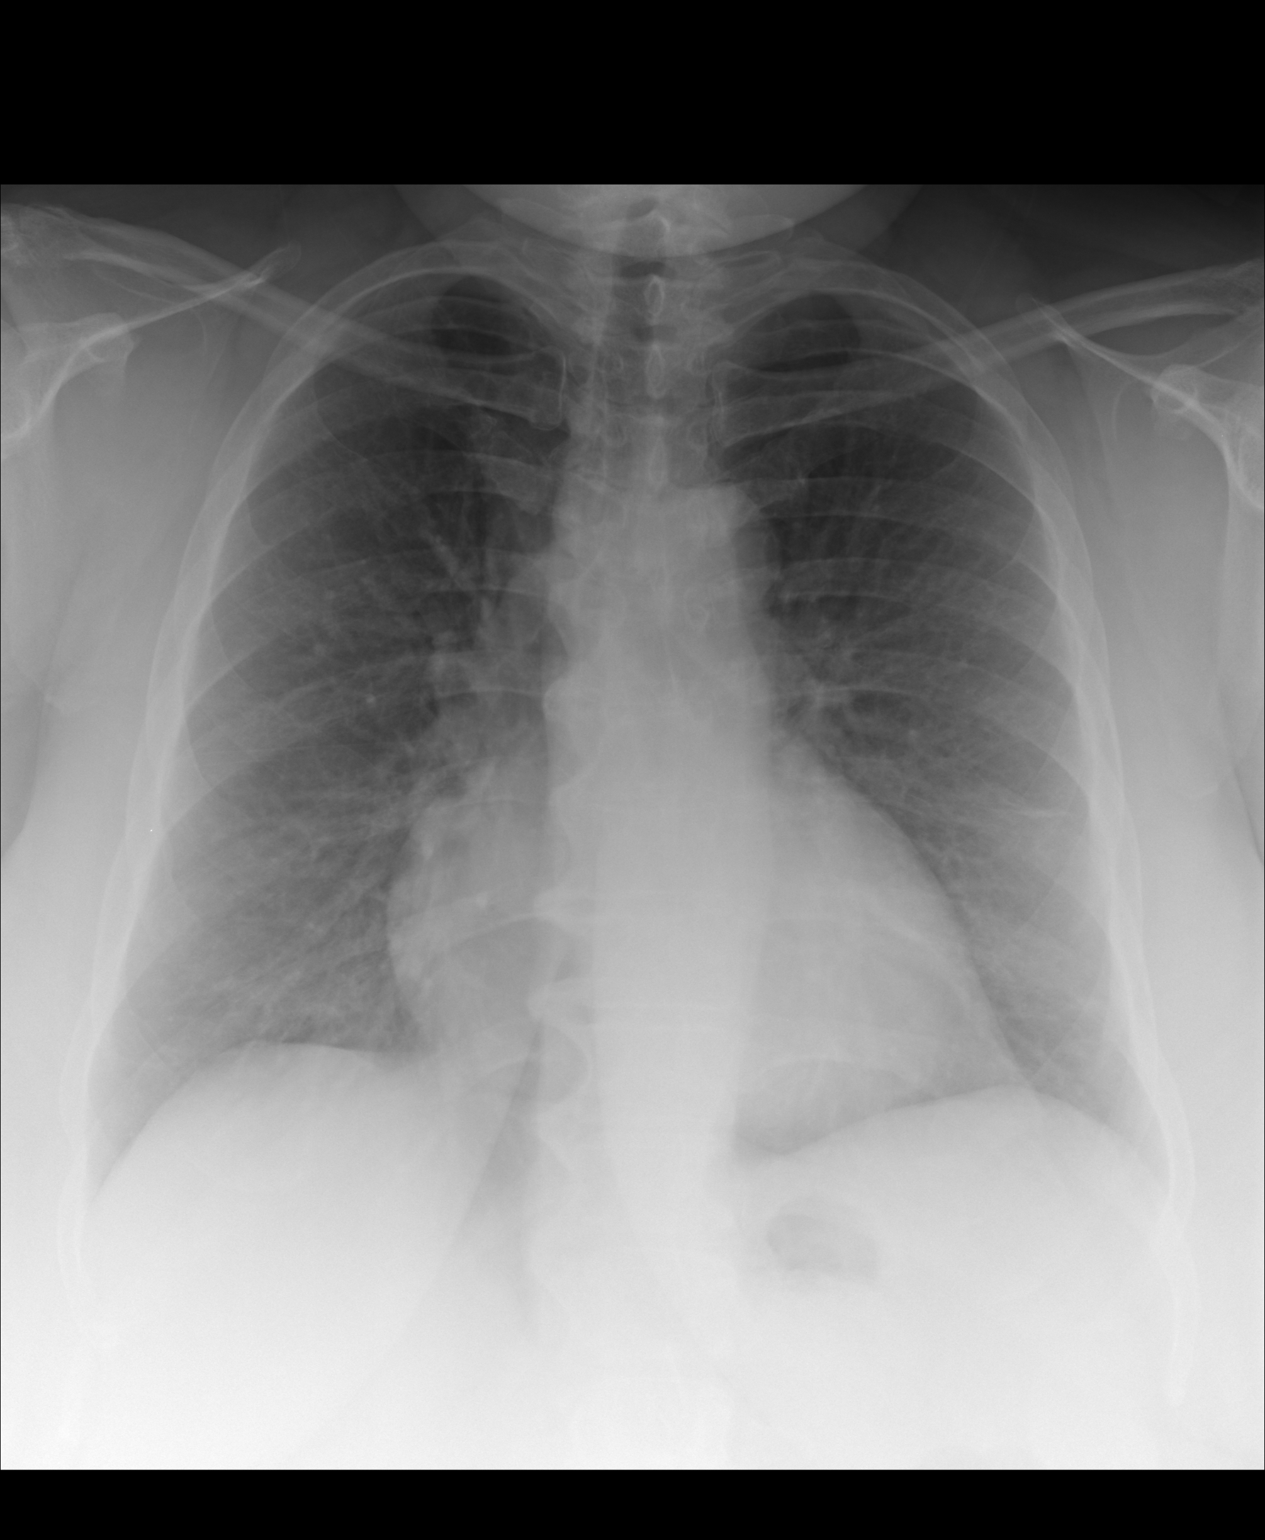
[im 2/2]
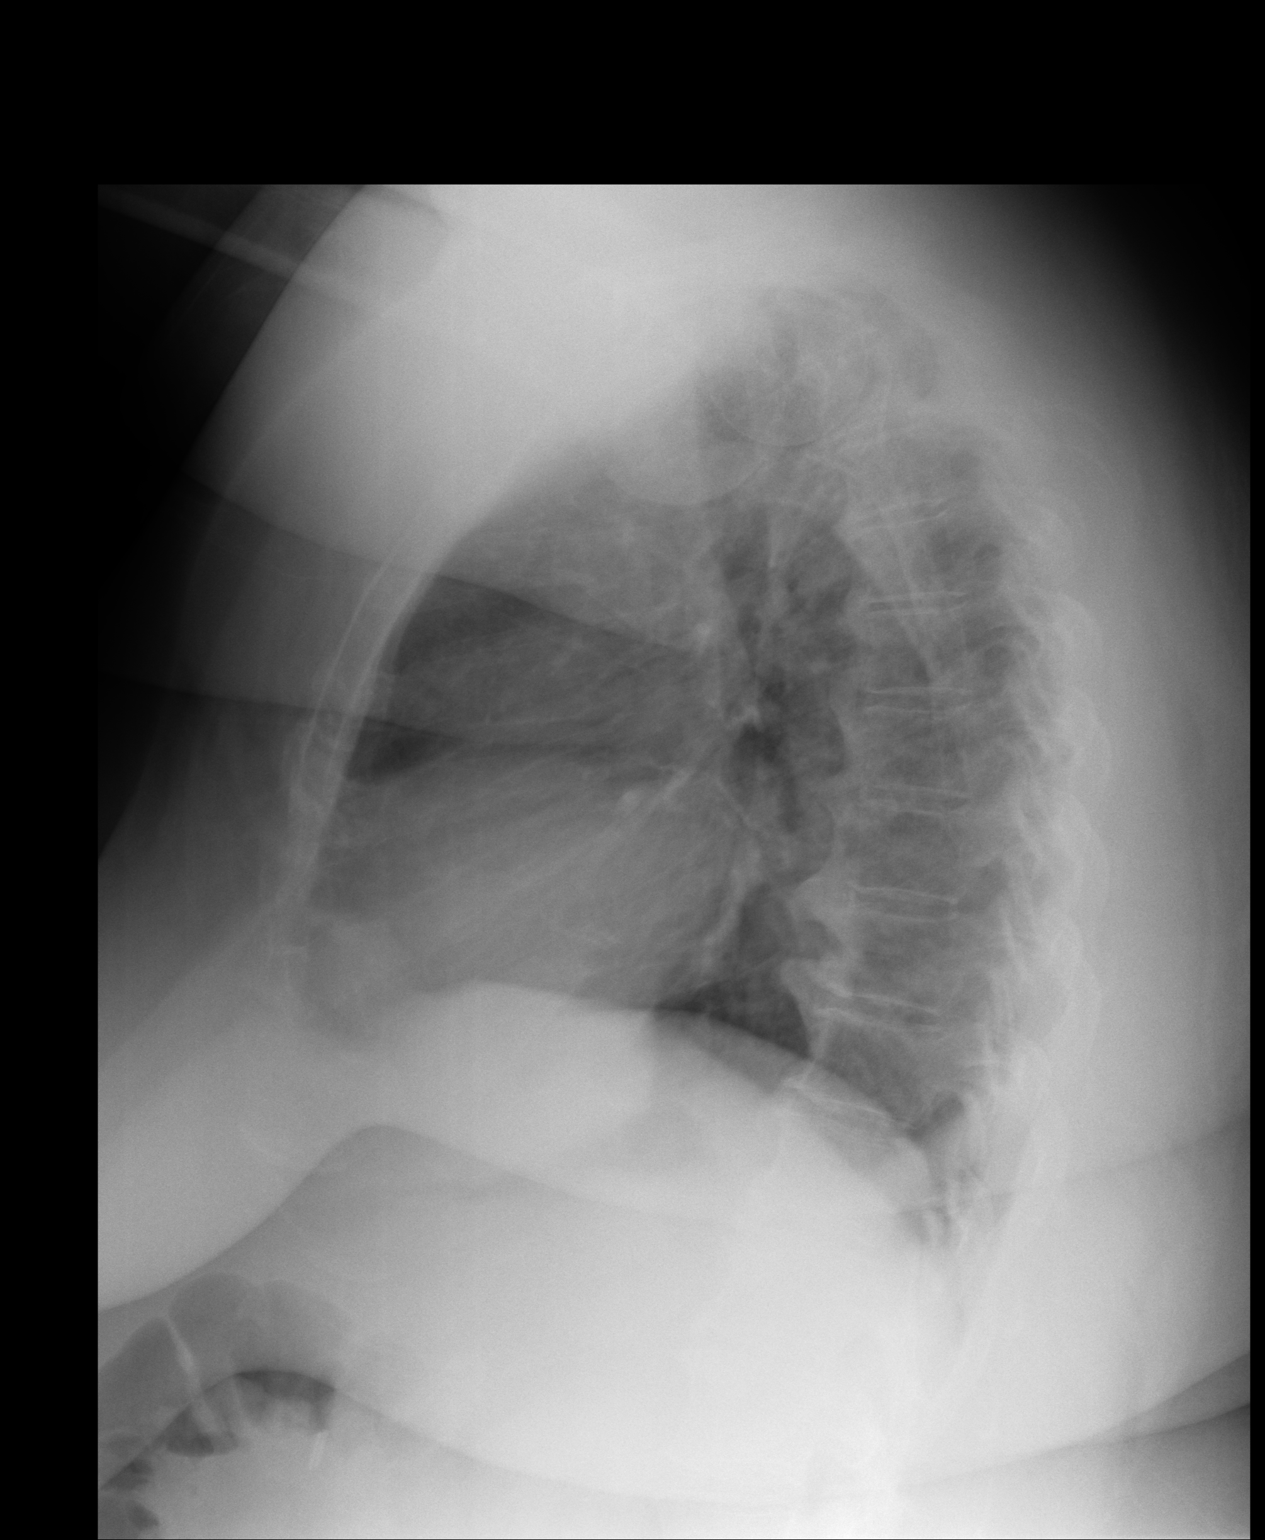

[2 of 2 positions shown; findings below may reference images not displayed]

IMPRESSION: No acute cardiopulmonary disease.

## 2008-10-30 IMAGING — CR DG ABDOMEN 3V
1 series · 5 of 5 positions shown · non-contrast
Comparison: none

REASON FOR EXAM: right flank abdominal pain
COMMENTS:

PROCEDURE:     DXR - DXR ABDOMEN 3-WAY (INCL PA CXR)  - October 06, 2007 [DATE]
RESULT:     The lungs are clear. There is cardiomegaly. Degenerative change
is noted of the thoracic spine.

[Series 1: view not recorded · 0.17mm/px · 5 of 5 slices shown]
[im 1/5]
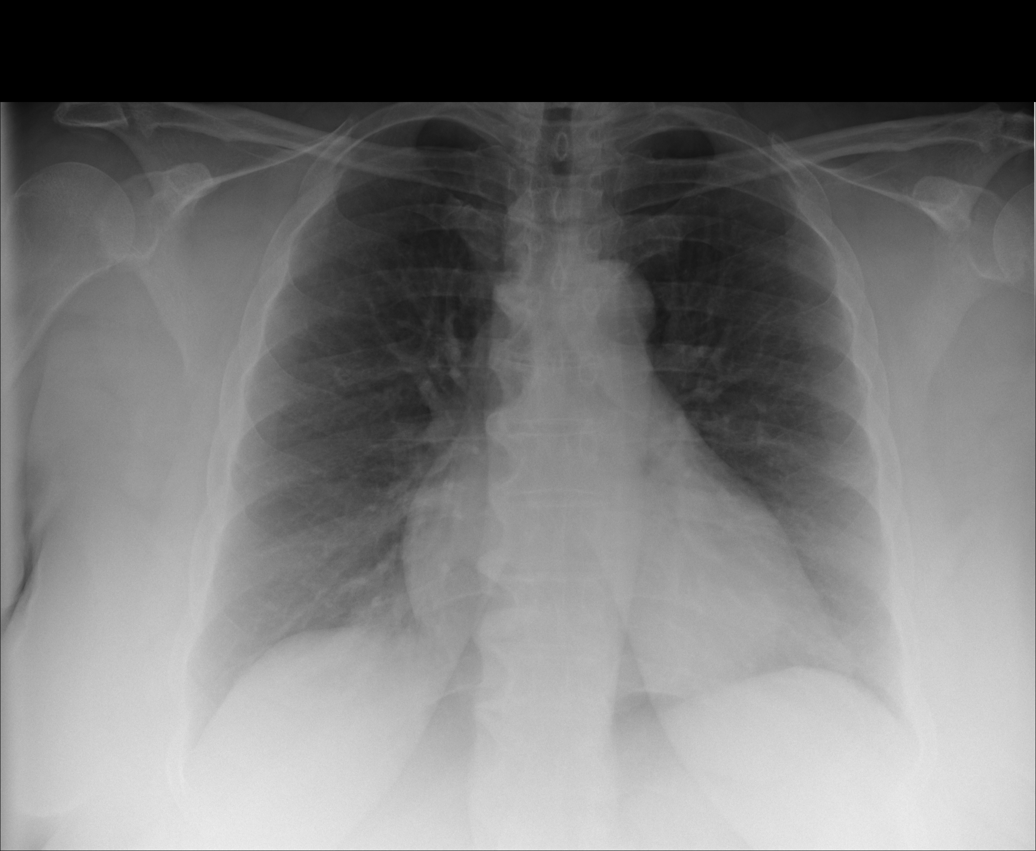
[im 2/5]
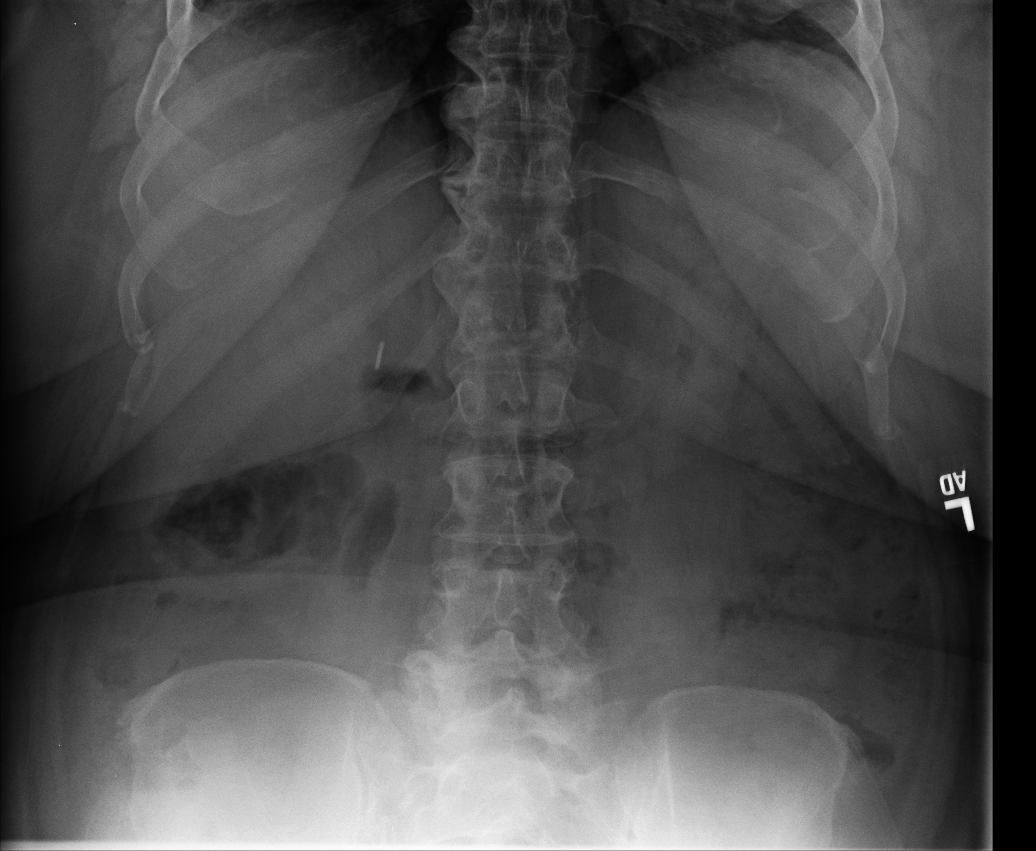
[im 3/5]
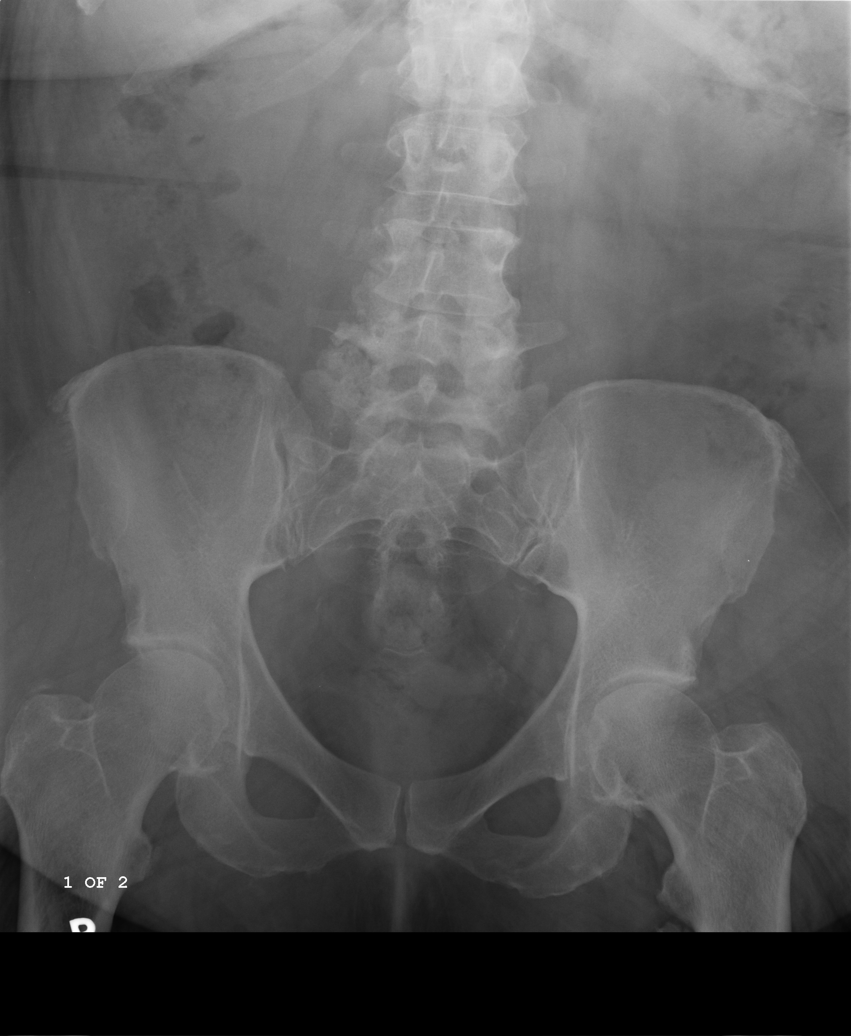
[im 4/5]
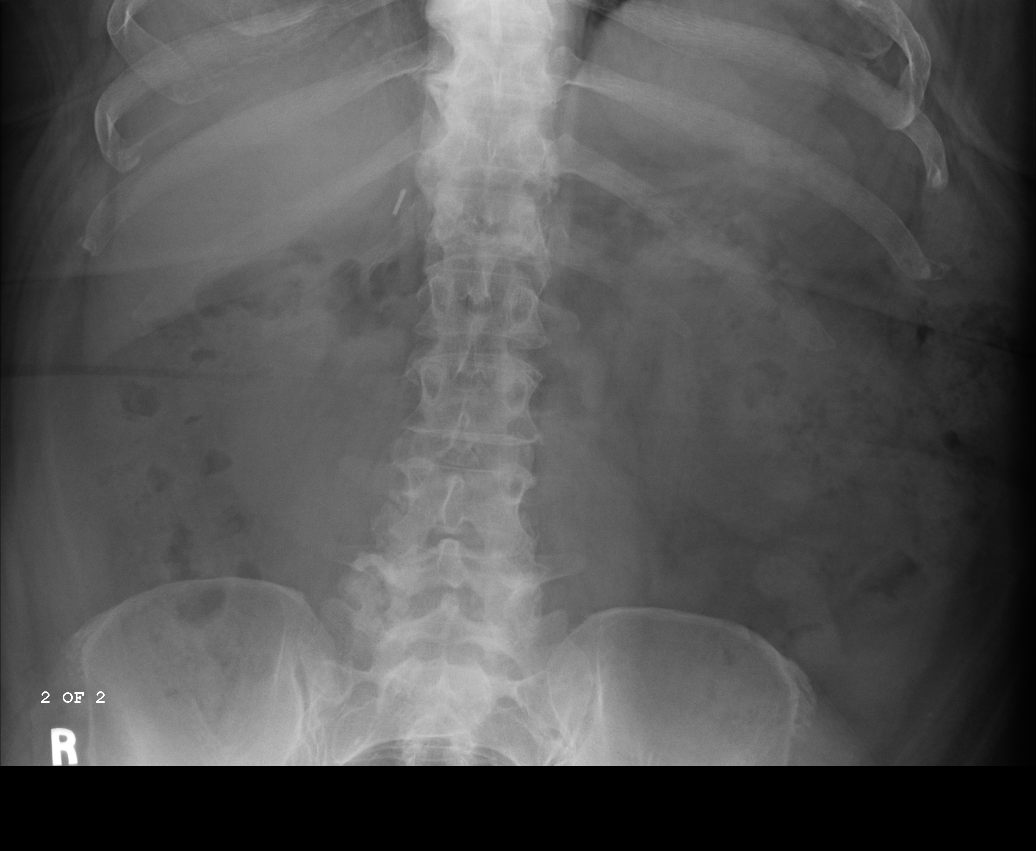
[im 5/5]
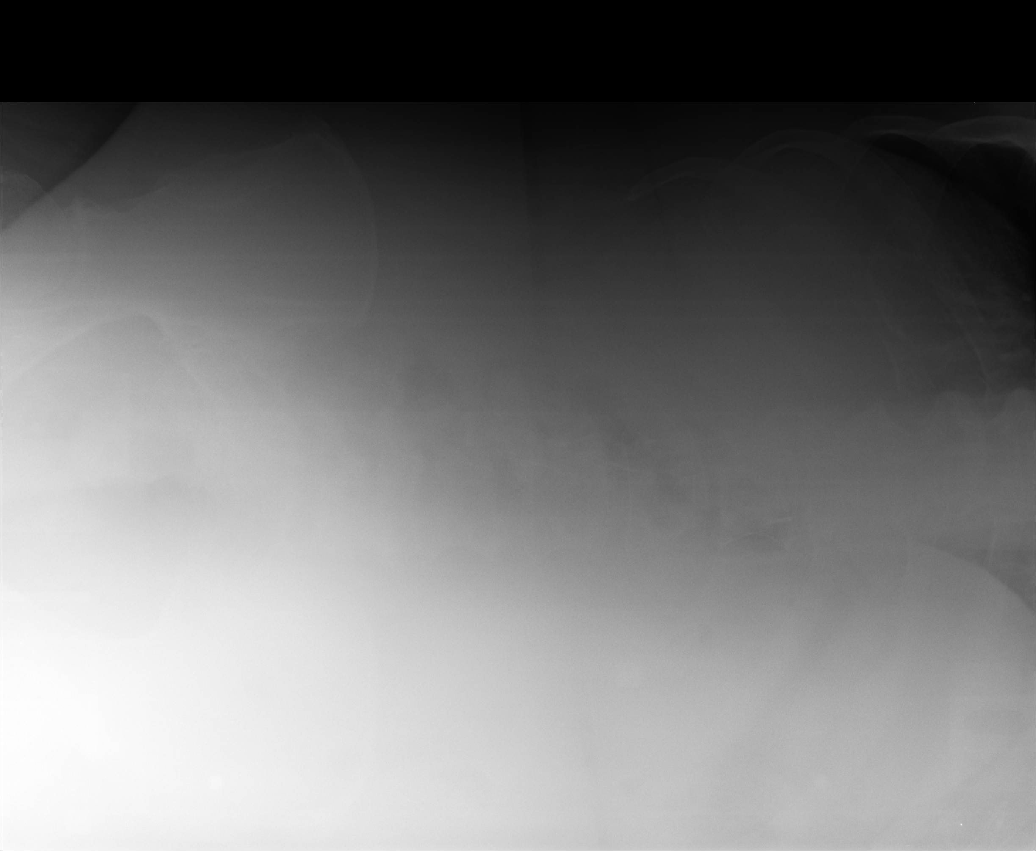

[5 of 5 positions shown; findings below may reference images not displayed]

IMPRESSION: 1.Cardiomegaly.

2.No acute cardiopulmonary disease otherwise noted. No congestive heart
failure or focal infiltrate noted.
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
## 2009-06-07 IMAGING — CR DG CHEST 2V
1 series · 2 of 2 positions shown · non-contrast
Comparison: none

REASON FOR EXAM: shortness of breath
COMMENTS:

[Series 1: view not recorded · 0.17mm/px · 2 of 2 slices shown]
[im 1/2]
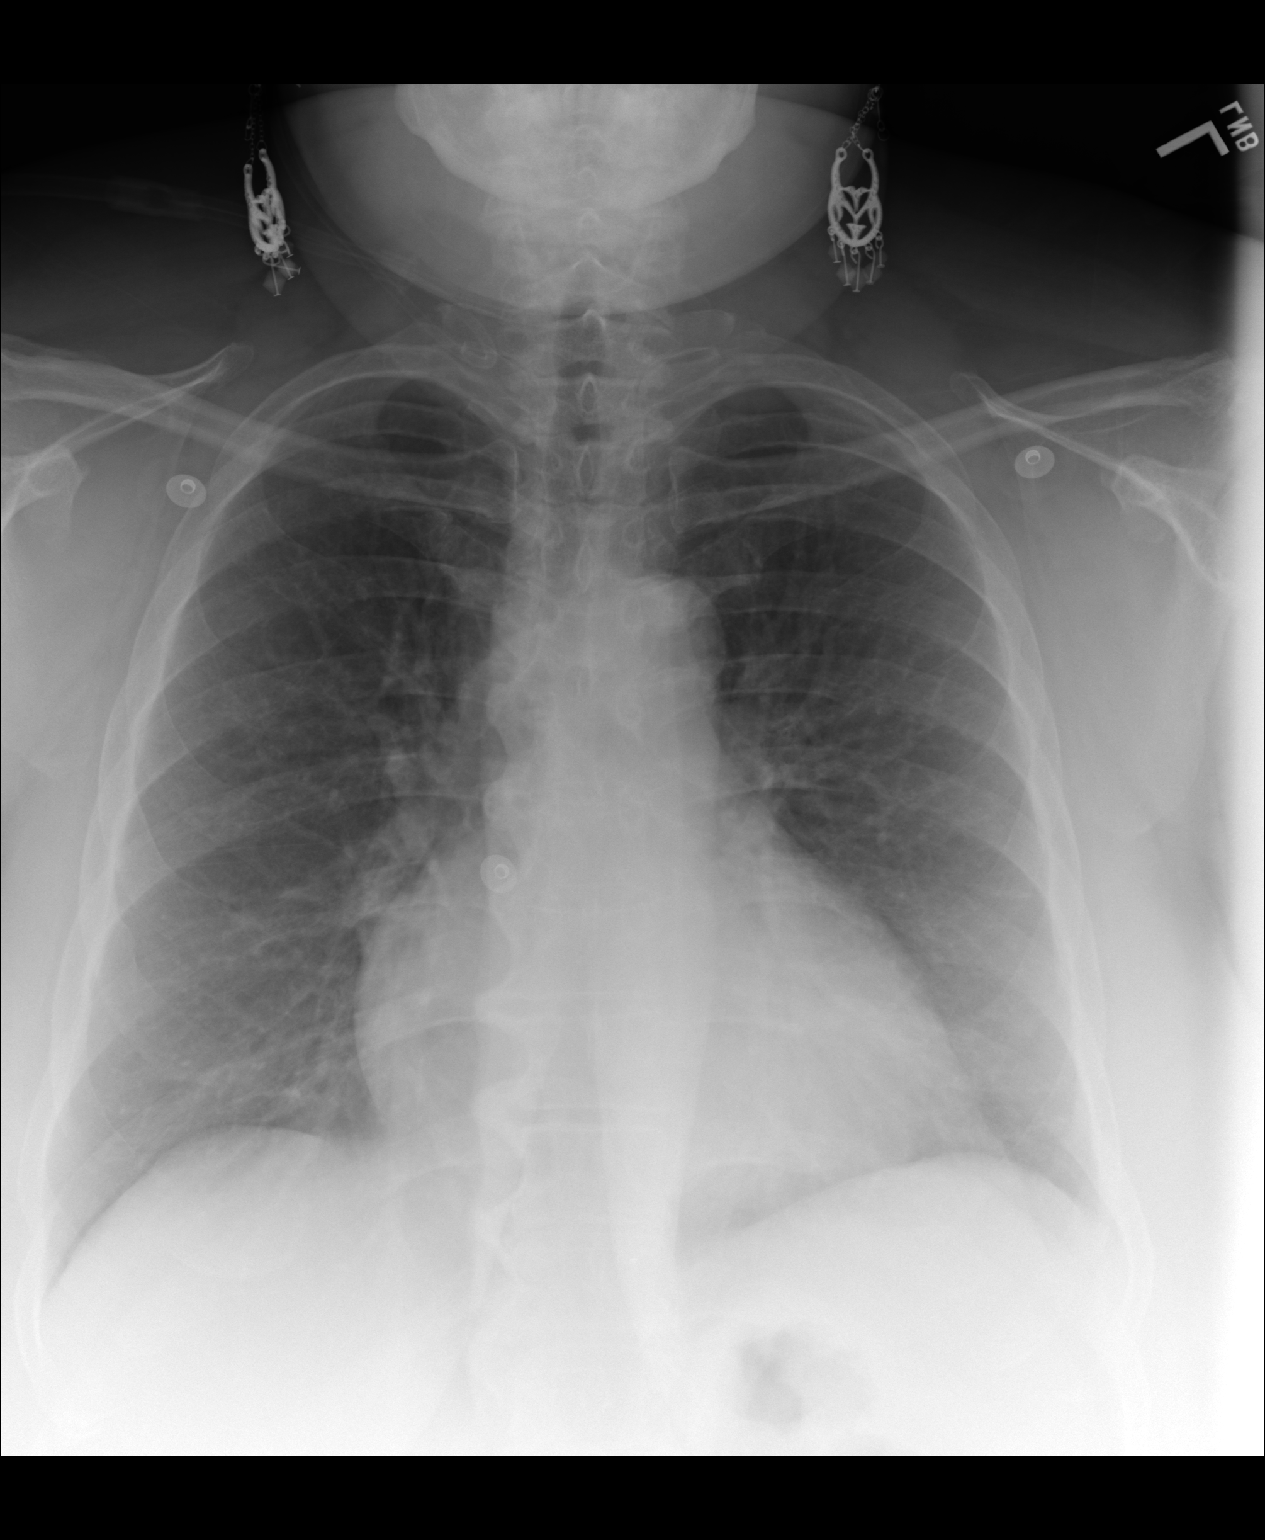
[im 2/2]
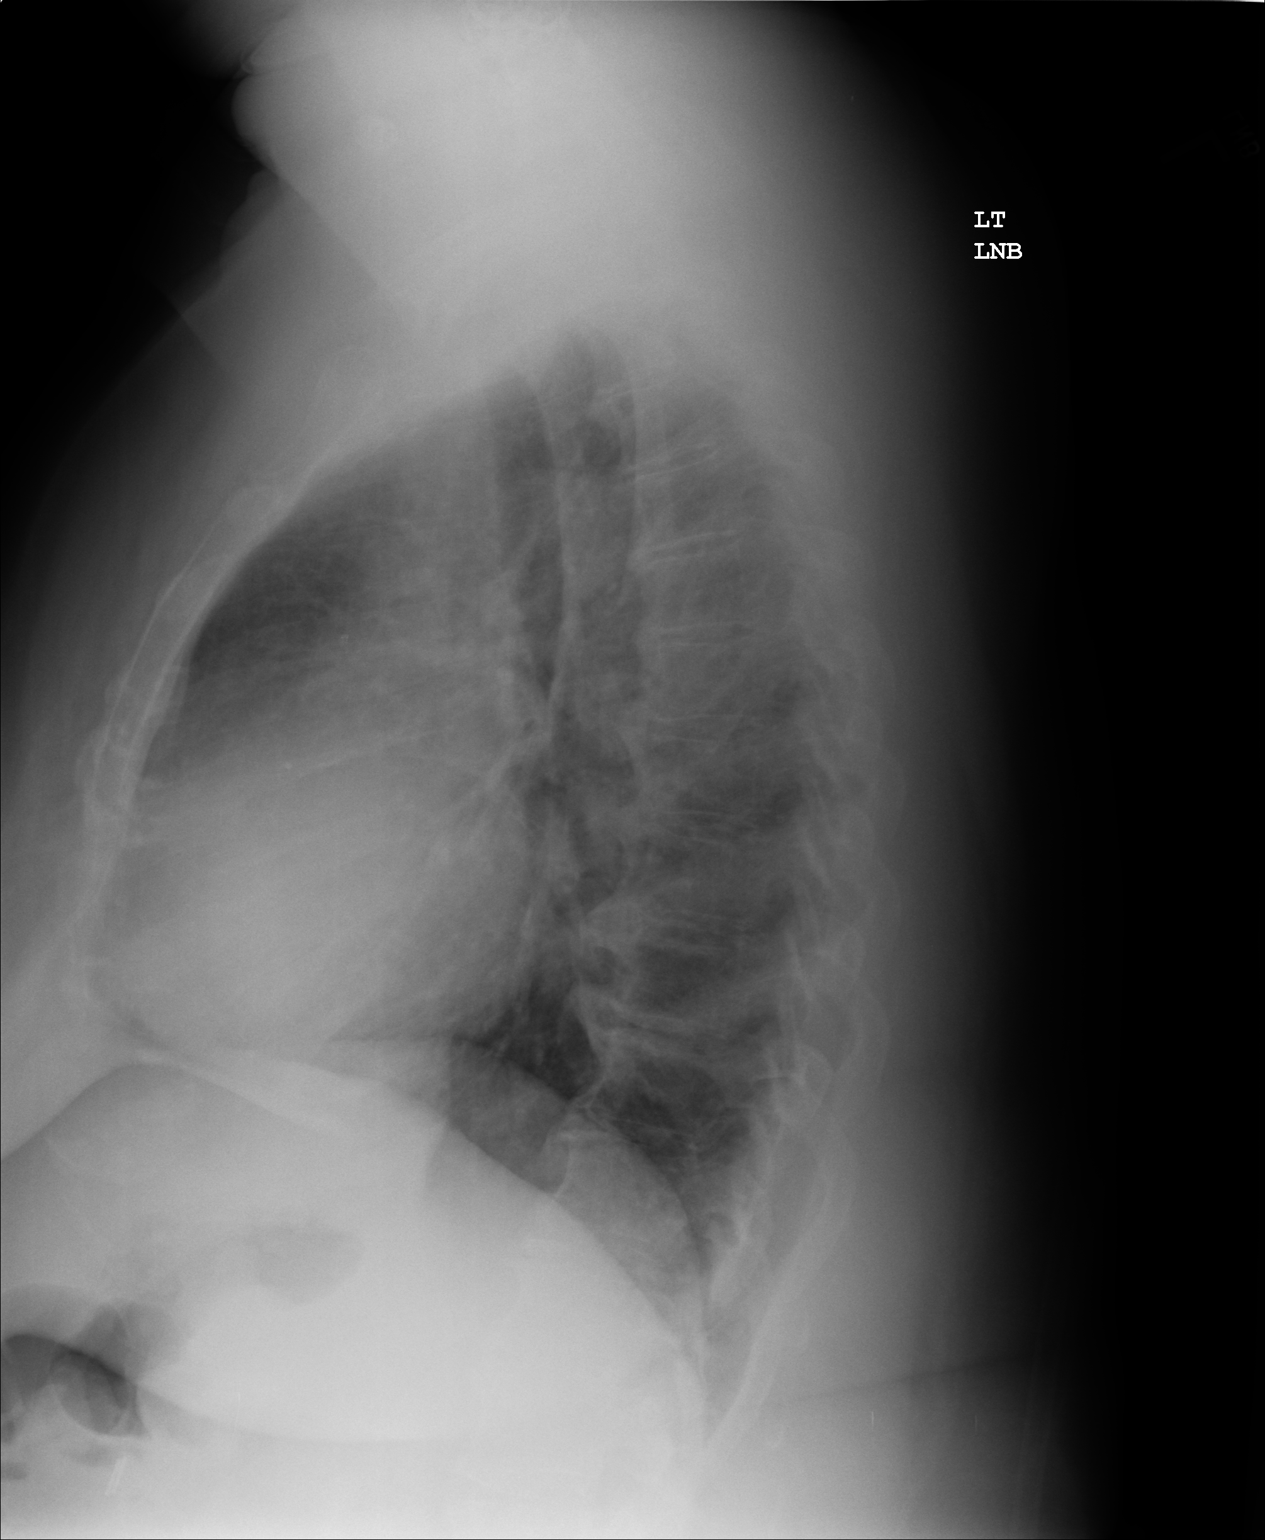

[2 of 2 positions shown; findings below may reference images not displayed]

PROCEDURE:     DXR - DXR CHEST PA (OR AP) AND LATERAL  - May 13, 2008  [DATE]

RESULT:     Comparison is made to the prior exam of 08/24/2007. The lung
fields are clear of infiltrate. No pneumonia, pneumothorax or pleural
effusion is seen. Mild cardiomegaly is again noted. There is degenerative
spurring at multiple levels of the thoracic spine.
IMPRESSION: 1. The lung fields are clear.
2. The heart is mildly enlarged but stable as compared to the prior exam.
3. Degenerative spurring is present at multiple levels of the thoracic spine.

## 2009-06-07 IMAGING — CR RIGHT FOOT COMPLETE - 3+ VIEW
1 series · 3 of 3 positions shown · non-contrast
Comparison: None

REASON FOR EXAM: foot pain
COMMENTS:

PROCEDURE:     DXR - DXR FOOT RT COMPLETE W/OBLIQUES  - May 13, 2008  [DATE]
RESULT:     History: Foot pain

[Series 1: view not recorded · 0.17mm/px · 3 of 3 slices shown]
[im 1/3]
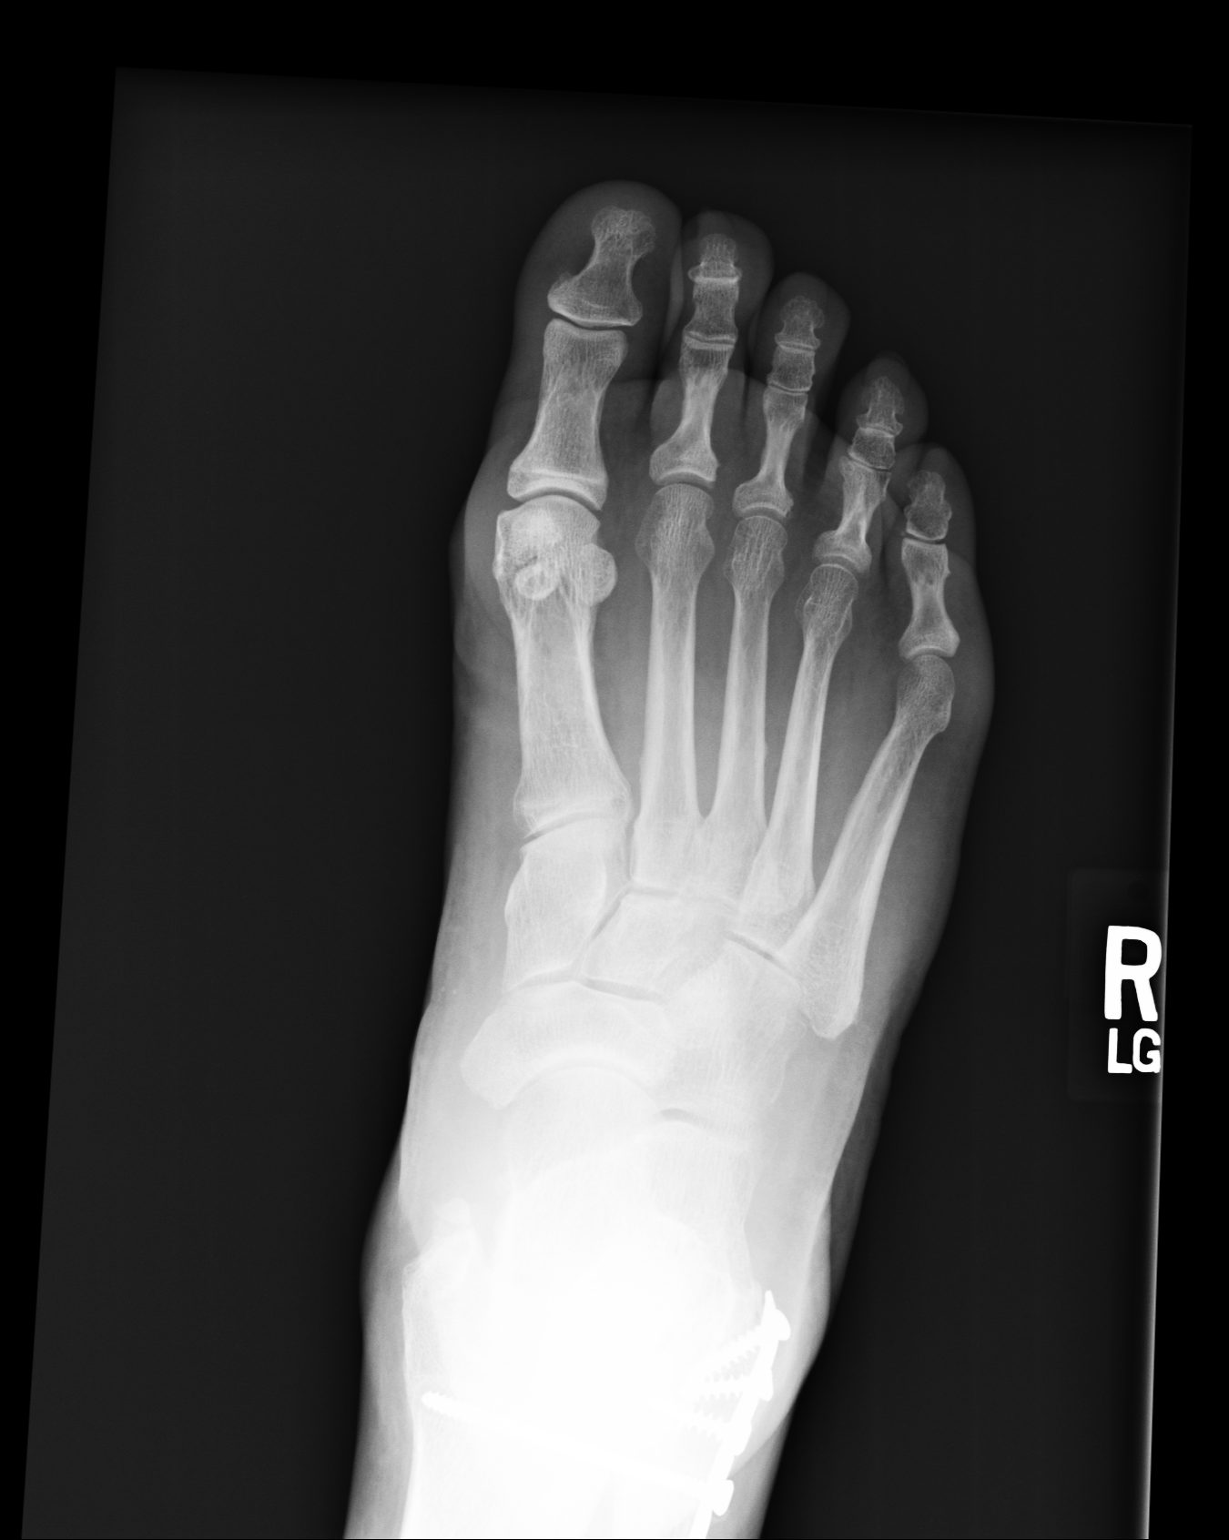
[im 2/3]
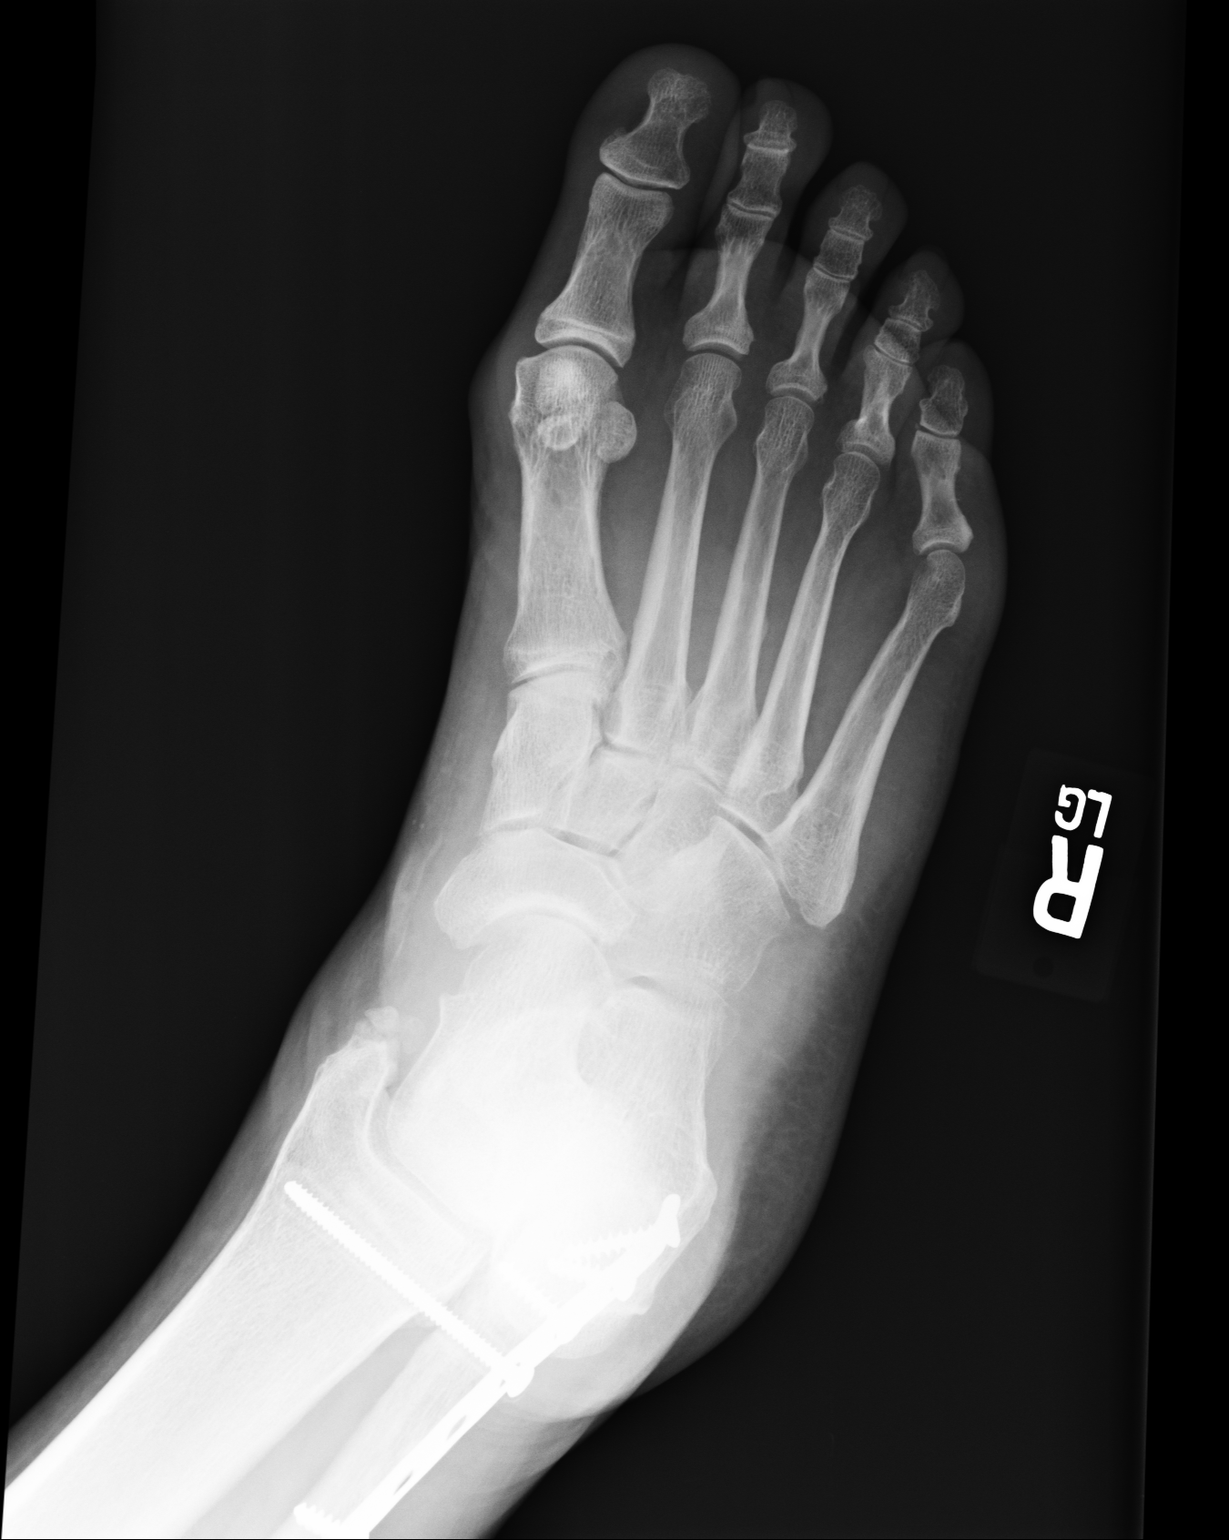
[im 3/3]
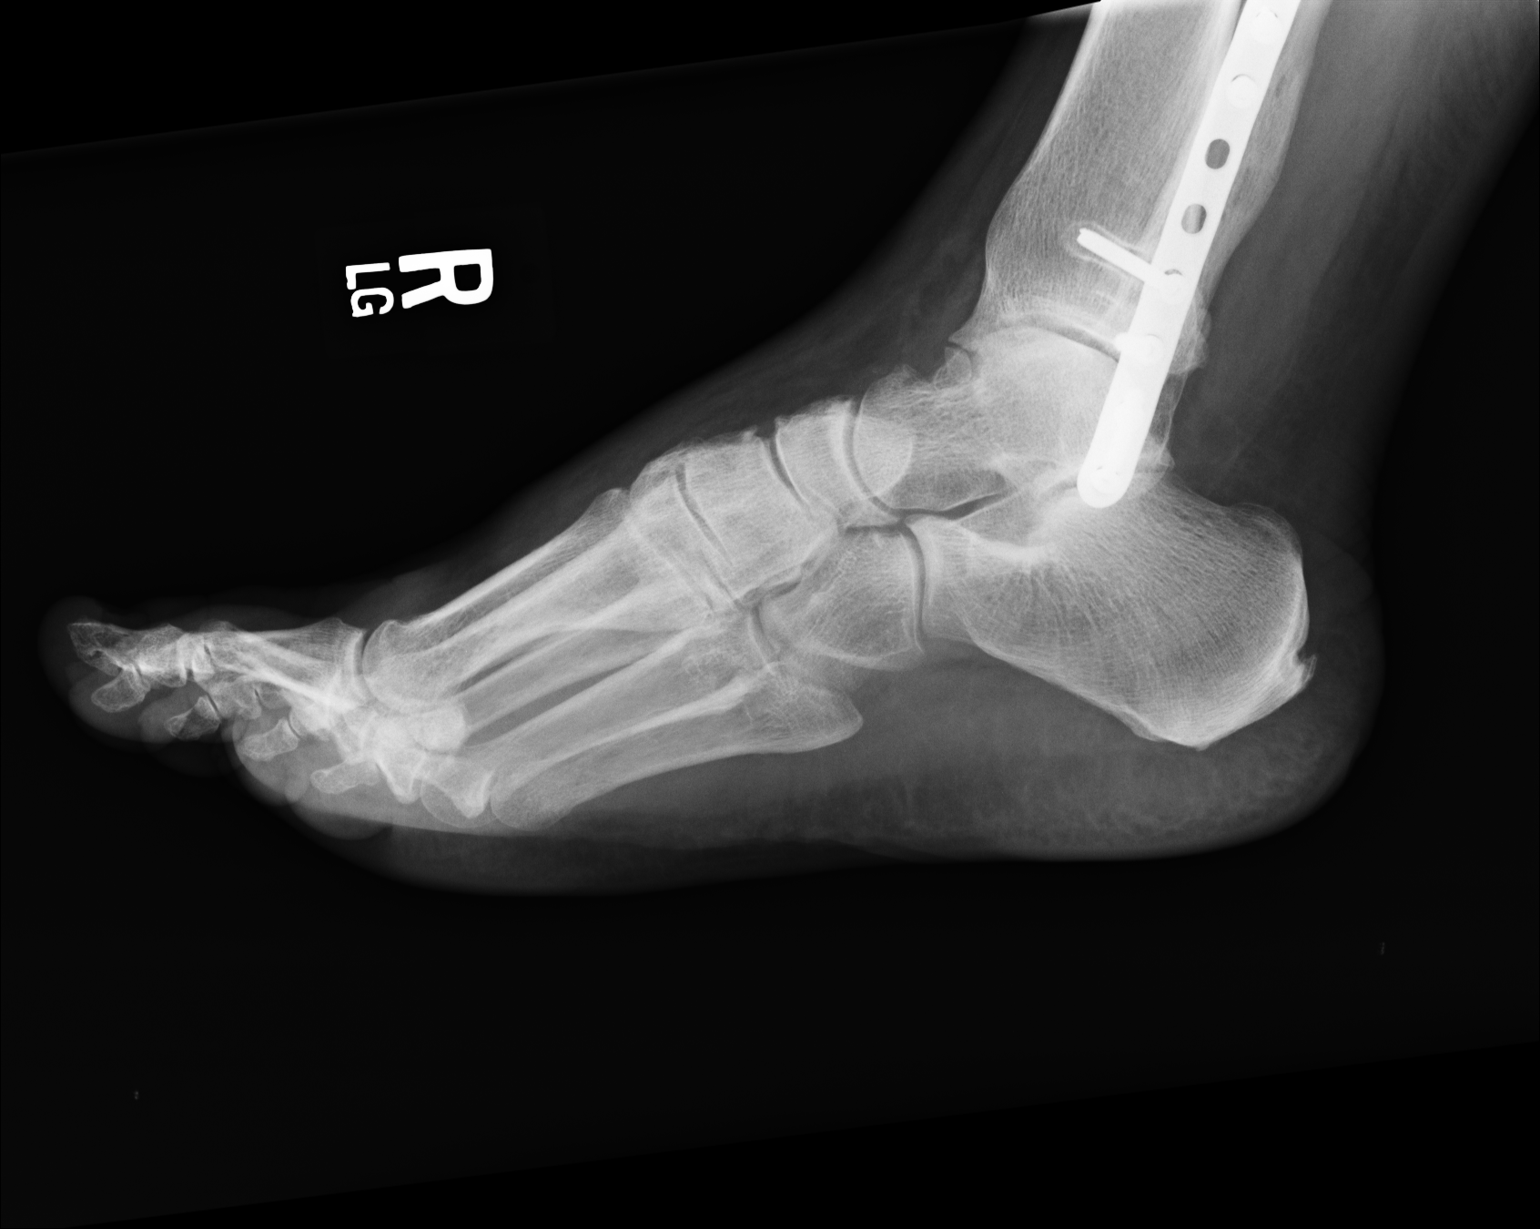

[3 of 3 positions shown; findings below may reference images not displayed]

FINDINGS: AP, oblique, and lateral views of the right foot demonstrates no fracture or
dislocation. There is a bipartite medial hallux sesamoid. There is evidence
of a distal lateral fibular sideplate noted. There is a single syndesmotic
screw present with surrounding lucency. There are degenerative changes of
the anterior tibiotalar joint. There is no soft tissue abnormality. There is
no subcutaneous emphysema or radiopaque foreign bodies.
IMPRESSION: No acute osseous injury of the right foot.

There is a single syndesmotic screw present with surrounding lucency
concerning for loosening versus infection.
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
## 2009-09-14 IMAGING — CR DG CHEST 1V PORT
1 series · 1 of 1 positions shown · non-contrast
Comparison: none

REASON FOR EXAM: Chest Pain
COMMENTS:

[view not recorded]
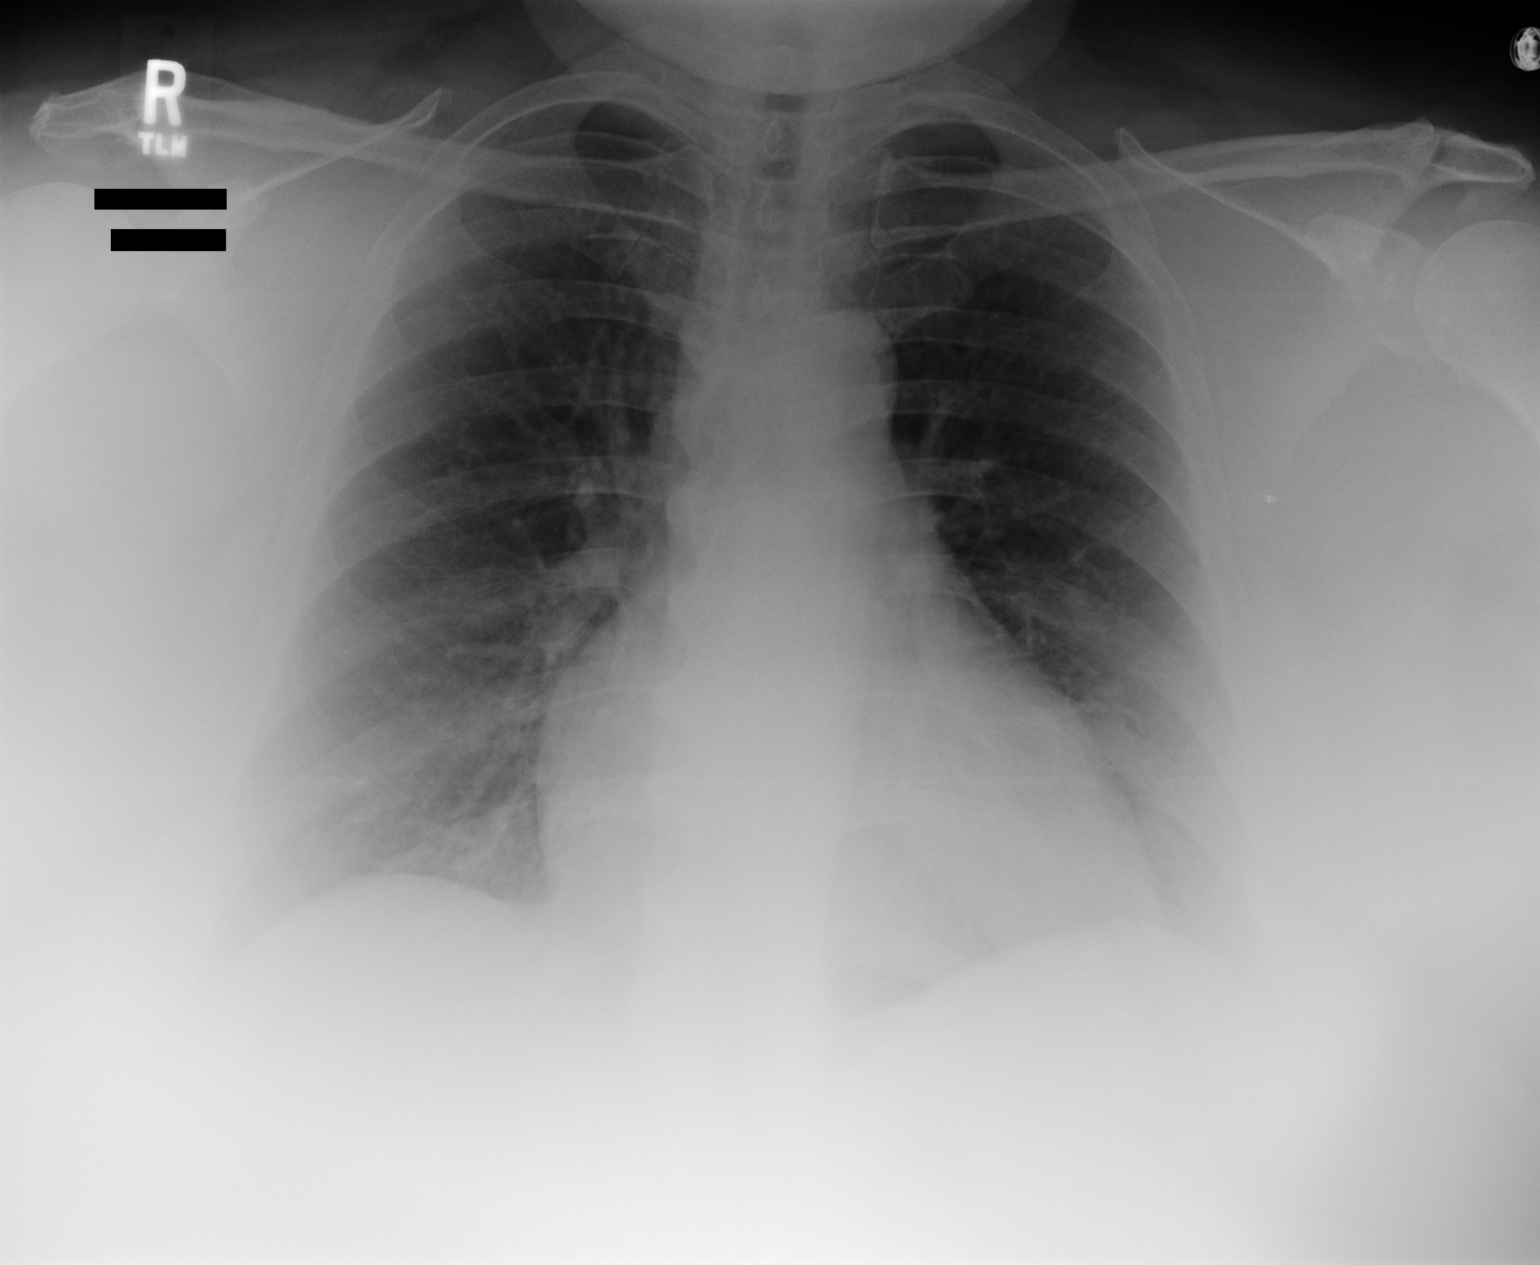

[1 of 1 positions shown; findings below may reference images not displayed]

PROCEDURE:     DXR - DXR PORTABLE CHEST SINGLE VIEW  - August 20, 2008  [DATE]

RESULT:     Comparison made to study 13 May, 2008.

The lungs are adequately inflated. There is no focal infiltrate. The cardiac
silhouette is mildly enlarged though stable. The central pulmonary
vascularity is prominent. There is no pleural effusion.
IMPRESSION: There is mild stable prominence of the cardiac silhouette
with prominent central pulmonary vascularity. I do not see evidence of focal
pneumonia. A followup PA and lateral chest x-ray would be of value.
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
## 2010-01-18 ENCOUNTER — Emergency Department: Payer: Self-pay | Admitting: Emergency Medicine

## 2010-02-03 ENCOUNTER — Ambulatory Visit: Payer: Self-pay | Admitting: Neurosurgery

## 2010-02-19 ENCOUNTER — Emergency Department: Payer: Self-pay | Admitting: Emergency Medicine

## 2010-05-01 ENCOUNTER — Observation Stay: Payer: Self-pay | Admitting: Internal Medicine

## 2010-06-01 NOTE — Op Note (Signed)
[name], [name]                ACCOUNT NO.:  [id number]   MEDICAL RECORD NO.:  [id number]          PATIENT TYPE:  AMB   LOCATION:  SDS                          FACILITY:  [facility]   PHYSICIAN:  Sheral Apley. 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   POSTOPERATIVE DIAGNOSIS:   PROCEDURE:  Left carpal tunnel release and left cubital tunnel release  through separate incisions.   SURGEON:  Sheral Apley. Burney Gauze, M.D.   ASSISTANT:  None.   ANESTHESIA:  General.   TOURNIQUET TIME:  38 minutes.   COMPLICATIONS:  None.   DRAINS:  None.   OPERATIVE REPORT:  The patient was taken to operating suite.  After the  induction of adequate general anesthesia, the left upper thigh was  prepped in sterile fashion.  An Esmarch was used to exsanguinate the  limb.  Tourniquet was then inflated to 275 mmHg.  At this point in time  the incision was made on the medial aspect of the left elbow centered  between the olecranon process and the medial epicondyle.  Skin was  incised.  Care was taken to identify and retract branches of the medial  antebrachial cutaneous nerve.   The cubital tunnel was identified.  The ulnar nerve was identified  proximal to the cubital tunnel.  It was released throughout its entire  length including under the skin bridge proximally, including release of  the medial inner muscular septum, and distally including release of the  fascia overlying the flexor carpi ulnaris muscle.  All pressure points  were relieved.  The wound was irrigated.  Hemostasis was achieved with  bipolar cautery.  It was loosely closed in layers with 2-0 undyed Vicryl  and a 3-0 Prolene subcuticular stitch.  Steri-Strips were applied.   A second incision was then made in the palmar aspect of the  left hand in  line with the long finger metacarpal starting at [name] cardinal line.  The skin was incised.  The palmar fascia was identified and split.  The  distal edge of the transverse carpal ligament was then divided with a 15-  blade.  Median nerve was identified and protected with the Publishing copy.  The remaining aspect of the transverse carpal ligament was  then divided under direct vision using the curved blunt scissors.  Canal  was inspected.  There were no osseous lesions or ganglions present.  It  was  irrigated and loosely closed with 3-0 Prolene subcuticular stitch.  Steri-Strips, 4 x 4's with fluffs, and a compressive dressing applied to  both the right wrist and the elbow.  The patient tolerated both  procedures well; and went to the recovery room in a stable fashion.      Sheral Apley Burney Gauze, M.D.  Electronically Signed     MAW/MEDQ  D:  09/27/2006  T:  09/27/2006  Job:  [id number]

## 2010-06-04 NOTE — Discharge Summary (Signed)
[name], [name]                ACCOUNT NO.:  [id number]   MEDICAL RECORD NO.:  [id number]          PATIENT TYPE:  INP   LOCATION:  5004                         FACILITY:  [facility]   PHYSICIAN:  Sheral Apley. Weingold, M.D.DATE OF BIRTH:  10-05-1955   DATE OF ADMISSION:  09/27/2006  DATE OF DISCHARGE:  10/02/2006                               DISCHARGE SUMMARY   PRINCIPAL DIAGNOSIS:  Cubital tunnel syndrome and carpal tunnel  syndrome.   SECONDARY DIAGNOSES:  1. Chronic respiratory distress.  2. Chronic obstructive pulmonary disease.  3. Coronary artery disease.  4. Hypertension.  5. Reflux disease.  6. Diabetes.  7. Obesity.   PRINCIPAL PROCEDURE:  Left carpal tunnel release and left cubital tunnel  release as well as pulmonary workup including echocardiogram.   MEDICATIONS:  On admission are detailed in her chart including:  1. Potassium chloride.  2. Nexium.  3. Furosemide.  4. Enalapril.  5. Coreg.  6. Plavix.   HOSPITAL COURSE:  The patient was taken to the operating room for a  routine carpal tunnel and cubital tunnel release on September 27, 2006.  In the recovery room, she developed significant respiratory distress,  was intubated, spent 2 days in the intensive care unit where she was  taken care of by the pulmonary team.  This included a workup for  aspiration pneumonia.  She was put on the appropriate antibiotics.  She  was given a 2D echo.  Other testing was performed.  She eventually was  extubated with basically normal blood gases, her antibiotics were  changed to Avelox, and she was discharged on October 02, 2006.  She  was discharged on antibiotics.   FOLLOWUP:  She was to followup with her own medical doctor in [place]  with regards to her pulmonary disease and sleep apnea.  She is to  followup in my office on October 03, 2006.      Sheral Apley Burney Gauze, M.D.  Electronically Signed    MAW/MEDQ  D:  11/01/2006  T:  11/02/2006  Job:  [id number]

## 2010-09-11 ENCOUNTER — Emergency Department: Payer: Self-pay | Admitting: Emergency Medicine

## 2010-10-29 LAB — CARDIAC PANEL(CRET KIN+CKTOT+MB+TROPI)
CK, MB: 1.3
CK, MB: 1.8
CK, MB: 1.8
Relative Index: 1
Relative Index: 1.3
Total CK: 112
Total CK: 142
Troponin I: 0.1 — ABNORMAL HIGH

## 2010-10-29 LAB — B-NATRIURETIC PEPTIDE (CONVERTED LAB): Pro B Natriuretic peptide (BNP): 30

## 2010-10-29 LAB — CBC
HCT: 36.4
HCT: 41
Hemoglobin: 12.2
Hemoglobin: 13.7
Hemoglobin: 13.9
MCHC: 33.3
MCV: 82.3
MCV: 83.3
Platelets: 136 — ABNORMAL LOW
Platelets: 176
Platelets: 196
Platelets: 198
RBC: 4.14
RBC: 4.93
RDW: 15.6 — ABNORMAL HIGH
RDW: 15.7 — ABNORMAL HIGH
WBC: 10.2
WBC: 10.7 — ABNORMAL HIGH
WBC: 13.7 — ABNORMAL HIGH

## 2010-10-29 LAB — COMPREHENSIVE METABOLIC PANEL
ALT: 16
ALT: 21
AST: 16
AST: 23
Albumin: 2.6 — ABNORMAL LOW
Albumin: 3.2 — ABNORMAL LOW
Alkaline Phosphatase: 106
Alkaline Phosphatase: 155 — ABNORMAL HIGH
CO2: 25
Calcium: 8.9
Chloride: 102
Creatinine, Ser: 1.2
GFR calc Af Amer: 55 — ABNORMAL LOW
GFR calc Af Amer: 57 — ABNORMAL LOW
GFR calc non Af Amer: 47 — ABNORMAL LOW
Glucose, Bld: 149 — ABNORMAL HIGH
Potassium: 3.6
Potassium: 4
Sodium: 138
Sodium: 139
Total Bilirubin: 1
Total Protein: 7.7

## 2010-10-29 LAB — URINALYSIS, ROUTINE W REFLEX MICROSCOPIC
Bilirubin Urine: NEGATIVE
Glucose, UA: NEGATIVE
Specific Gravity, Urine: 1.013
Urobilinogen, UA: 0.2

## 2010-10-29 LAB — POCT I-STAT 3, ART BLOOD GAS (G3+)
Acid-Base Excess: 1
Acid-Base Excess: 2
Acid-Base Excess: 3 — ABNORMAL HIGH
Acid-Base Excess: 3 — ABNORMAL HIGH
Bicarbonate: 26.4 — ABNORMAL HIGH
Bicarbonate: 27 — ABNORMAL HIGH
Bicarbonate: 27.1 — ABNORMAL HIGH
Bicarbonate: 27.8 — ABNORMAL HIGH
O2 Saturation: 90
O2 Saturation: 92
O2 Saturation: 93
O2 Saturation: 94
Operator id: 129711
Operator id: 229971
Operator id: 251151
Operator id: 283401
Patient temperature: 97.1
Patient temperature: 98.6
Patient temperature: 99.2
TCO2: 28
TCO2: 28
TCO2: 28
TCO2: 29
pCO2 arterial: 39.8
pCO2 arterial: 40.8
pCO2 arterial: 41.7
pCO2 arterial: 45.1 — ABNORMAL HIGH
pH, Arterial: 7.398
pH, Arterial: 7.407 — ABNORMAL HIGH
pH, Arterial: 7.431 — ABNORMAL HIGH
pH, Arterial: 7.44 — ABNORMAL HIGH
pO2, Arterial: 57 — ABNORMAL LOW
pO2, Arterial: 63 — ABNORMAL LOW
pO2, Arterial: 65 — ABNORMAL LOW
pO2, Arterial: 70 — ABNORMAL LOW

## 2010-10-29 LAB — BLOOD GAS, ARTERIAL
Acid-base deficit: 0.4
Bicarbonate: 26 — ABNORMAL HIGH
Drawn by: 280981
FIO2: 1
MECHVT: 600
O2 Saturation: 86
PEEP: 5
Patient temperature: 98.6
RATE: 12
TCO2: 27.9
pCO2 arterial: 61.3
pH, Arterial: 7.251 — ABNORMAL LOW
pO2, Arterial: 60.2 — ABNORMAL LOW

## 2010-10-29 LAB — BASIC METABOLIC PANEL
BUN: 10
BUN: 13
CO2: 28
CO2: 29
Calcium: 9.3
Chloride: 104
Chloride: 104
Creatinine, Ser: 0.96
GFR calc Af Amer: >60
GFR calc non Af Amer: >60
Glucose, Bld: 119 — ABNORMAL HIGH
Glucose, Bld: 129 — ABNORMAL HIGH
Potassium: 3.9
Potassium: 4.9
Sodium: 141

## 2010-10-29 LAB — TSH: TSH: 0.793

## 2010-10-29 LAB — POCT I-STAT 7, (LYTES, BLD GAS, ICA,H+H)
Acid-base deficit: 2
Bicarbonate: 26.7 — ABNORMAL HIGH
Calcium, Ion: 1.22
HCT: 47 — ABNORMAL HIGH
Hemoglobin: 16 — ABNORMAL HIGH
O2 Saturation: 63
Operator id: 119851
Potassium: 4.8
Sodium: 136
TCO2: 28
pCO2 arterial: 59.9
pH, Arterial: 7.257 — ABNORMAL LOW
pO2, Arterial: 38 — CL

## 2010-10-29 LAB — CULTURE, BLOOD (ROUTINE X 2)
Culture: NO GROWTH
Culture: NO GROWTH

## 2010-10-29 LAB — CULTURE, BAL-QUANTITATIVE W GRAM STAIN
Colony Count: NO GROWTH
Culture: NO GROWTH
Gram Stain: NONE SEEN

## 2010-10-29 LAB — URINE MICROSCOPIC-ADD ON

## 2010-10-29 LAB — URINE CULTURE: Special Requests: NEGATIVE

## 2011-01-31 ENCOUNTER — Emergency Department: Payer: Self-pay | Admitting: Emergency Medicine

## 2011-01-31 LAB — URINALYSIS, COMPLETE
Bacteria: NONE SEEN
Bilirubin,UR: NEGATIVE
Ketone: NEGATIVE
Protein: NEGATIVE
RBC,UR: 1 /HPF (ref 0–5)
Specific Gravity: 1.009 (ref 1.003–1.030)
Squamous Epithelial: 3
WBC UR: 7 /HPF (ref 0–5)

## 2011-01-31 LAB — BASIC METABOLIC PANEL
Anion Gap: 9 (ref 7–16)
Creatinine: 1.03 mg/dL (ref 0.60–1.30)
EGFR (African American): >60
Osmolality: 286 (ref 275–301)

## 2011-01-31 LAB — CBC
MCH: 29.2 pg (ref 26.0–34.0)
MCHC: 33.6 g/dL (ref 32.0–36.0)
Platelet: 130 10*3/uL — ABNORMAL LOW (ref 150–440)
RDW: 13.5 % (ref 11.5–14.5)

## 2011-02-12 IMAGING — CT CT HEAD WITHOUT CONTRAST
2 series · 16 of 30 positions shown, 20 images · non-contrast
Comparison: none

REASON FOR EXAM: trauma
COMMENTS:   LMP: Post-Menopausal

PROCEDURE:     CT  - CT HEAD WITHOUT CONTRAST  - January 18, 2010  [DATE]
RESULT:     Comparison:  None
TECHNIQUE: Multiple axial images from the foramen magnum to the vertex were
obtained without IV contrast.

[Series 2: without · axial · non-contrast · 0.39mm/px · z∈[+260,+380]mm · 13 of 29 slices shown, 17 images]
[im 3/29  brain]
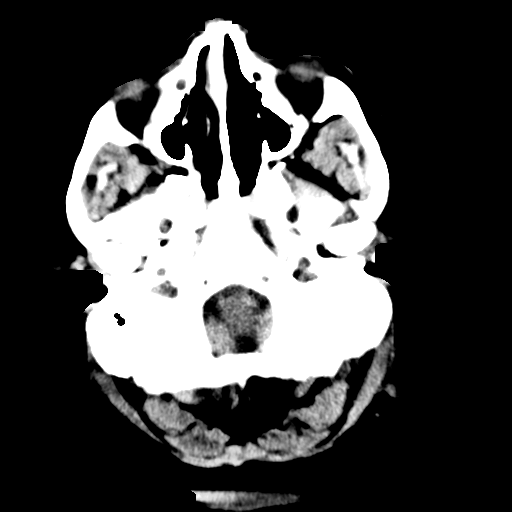
[im 3/29  bone]
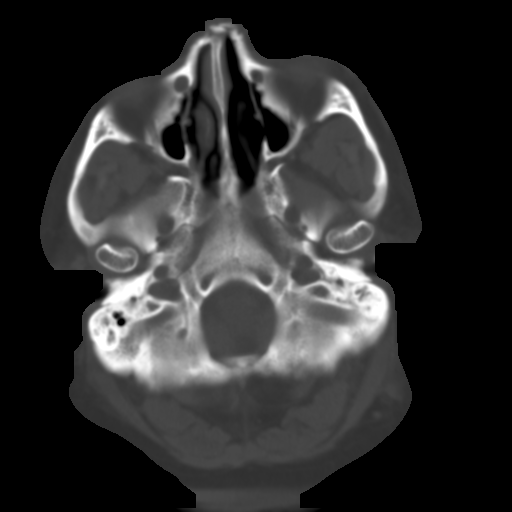
[im 5/29  brain]
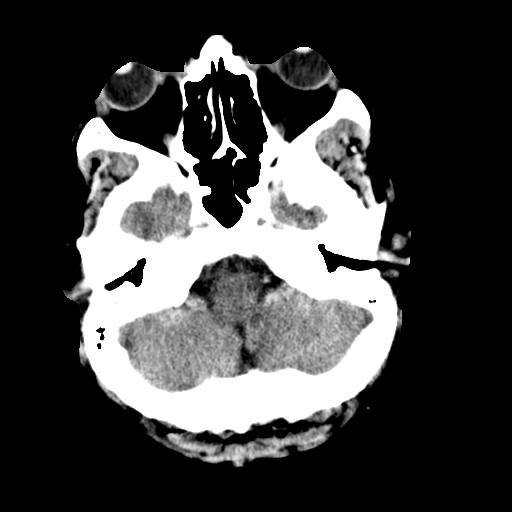
[im 7/29  brain]
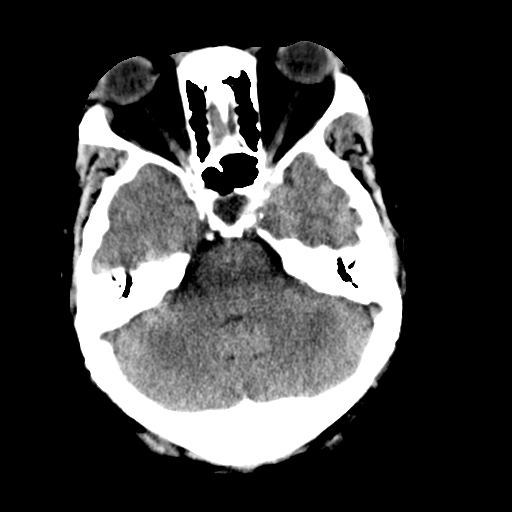
[im 9/29  brain]
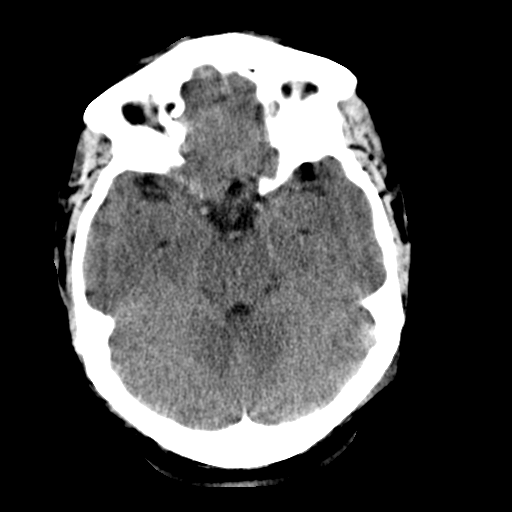
[im 11/29  brain]
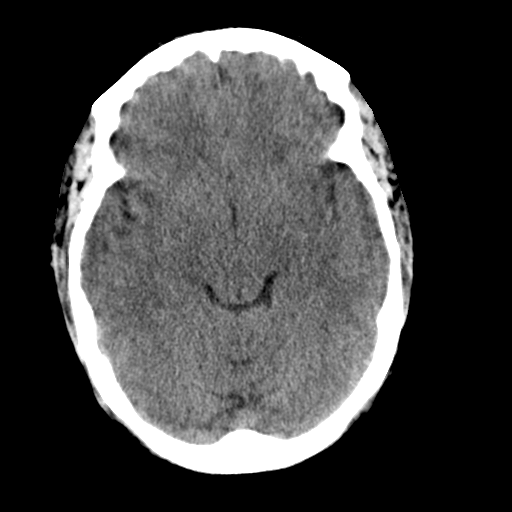
[im 11/29  bone]
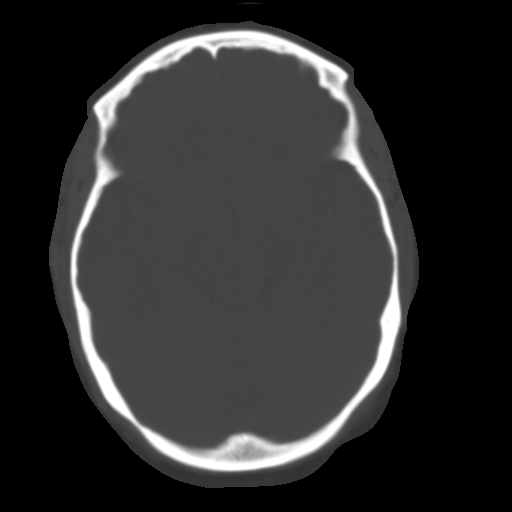
[im 13/29  brain]
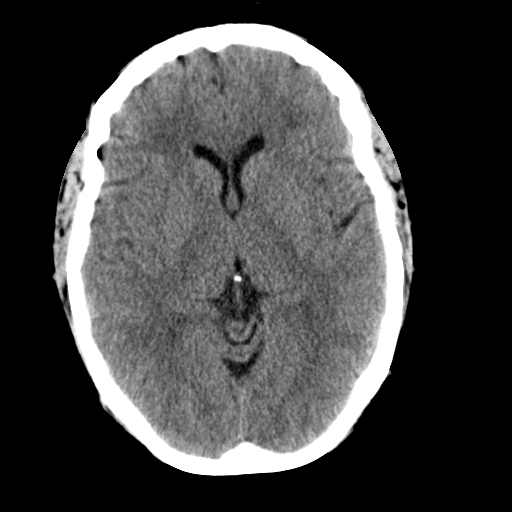
[im 15/29  brain]
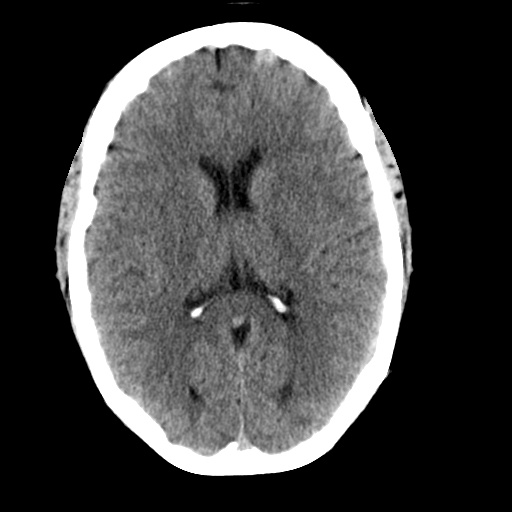
[im 17/29  brain]
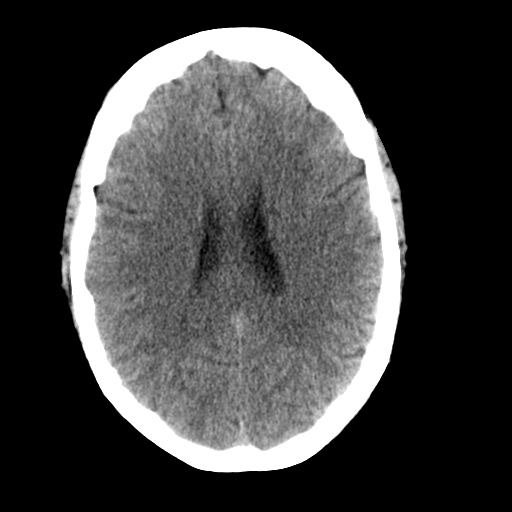
[im 19/29  brain]
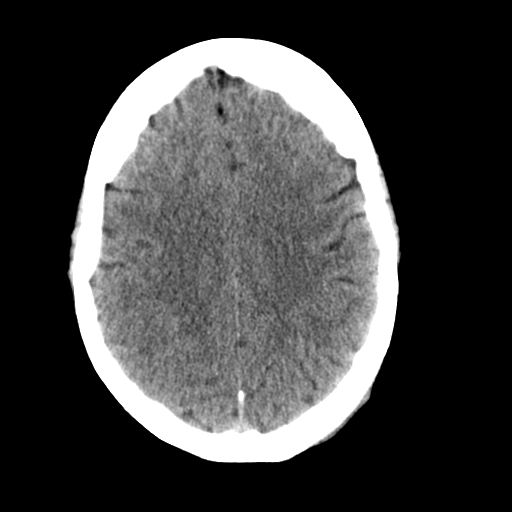
[im 19/29  bone]
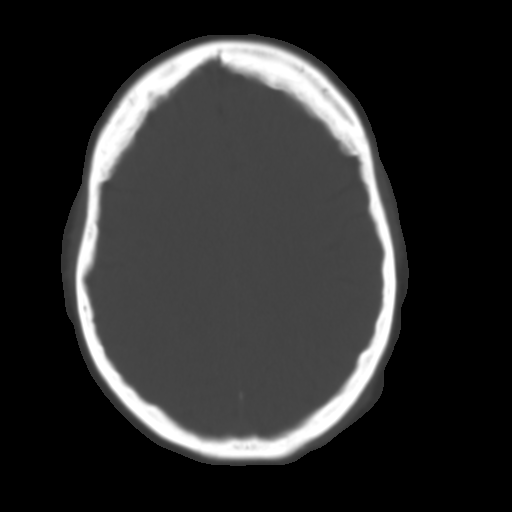
[im 21/29  brain]
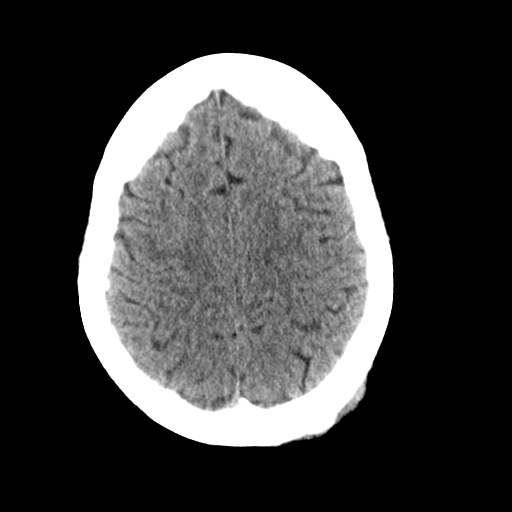
[im 23/29  brain]
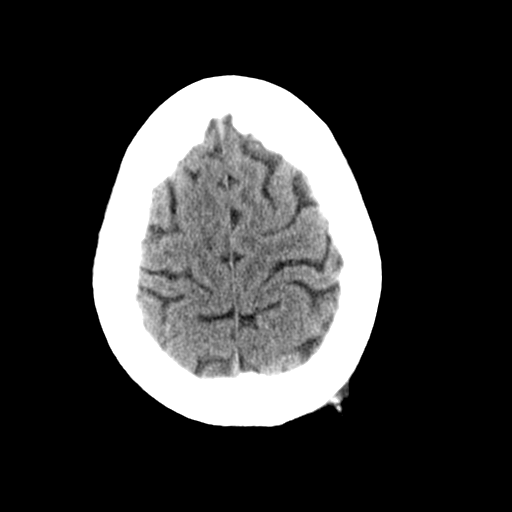
[im 25/29  brain]
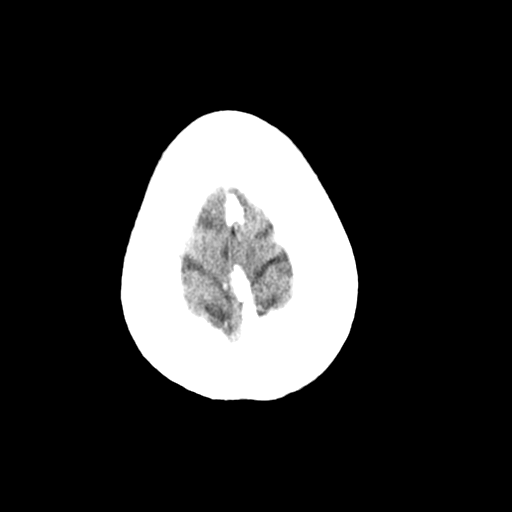
[im 27/29  brain]
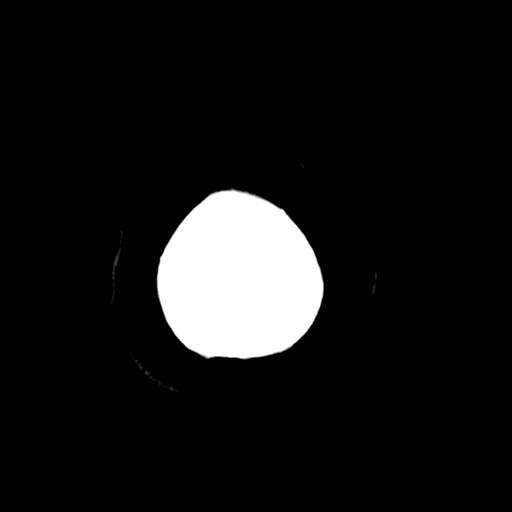
[im 27/29  bone]
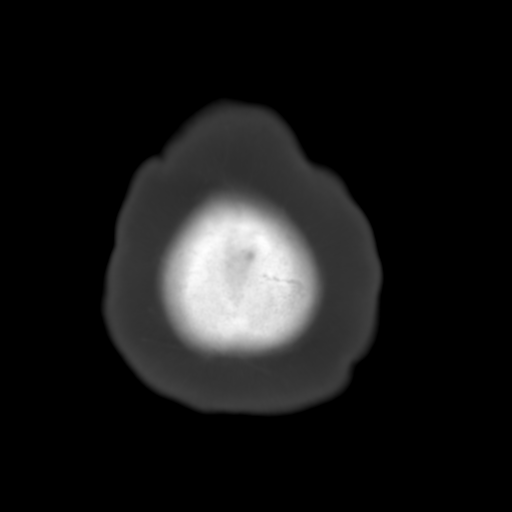

[Series 3: bone · axial · 0.39mm/px · z∈[+260,+300]mm · 3 of 29 slices shown]
[im 3/29  bone]
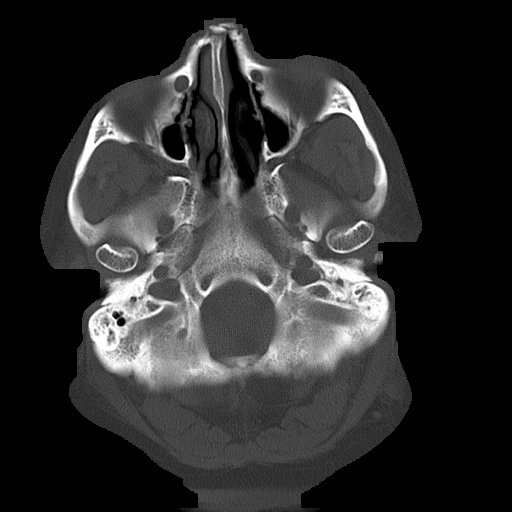
[im 7/29  bone]
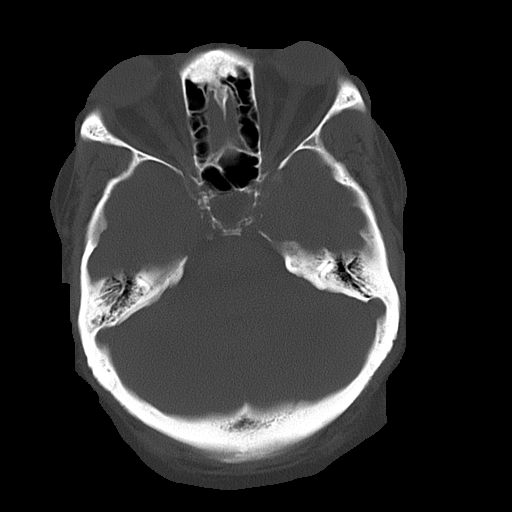
[im 11/29  bone]
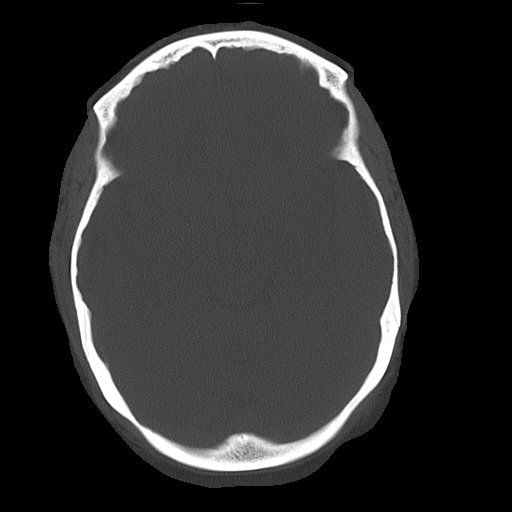

[16 of 30 positions shown; findings below may reference images not displayed]

FINDINGS: There is no evidence of mass effect, midline shift, or extra-axial fluid
collections.  There is no evidence of a space-occupying lesion or
intracranial hemorrhage. There is no evidence of a cortical-based area of
acute infarction.

The ventricles and sulci are appropriate for the patient's age. The basal
cisterns are patent.

Visualized portions of the orbits are unremarkable. The visualized portions
of the paranasal sinuses and mastoid air cells are unremarkable.

The osseous structures are unremarkable.
IMPRESSION: No acute intracranial process.

## 2011-02-28 IMAGING — MR MRI LUMBAR SPINE WITHOUT CONTRAST
4 of 5 series · 24 of 48 positions shown · non-contrast
Comparison: none

REASON FOR EXAM: low back pain and neck pain
COMMENTS:

PROCEDURE:     MMR - MMR LUMBAR SPINE WO CONTRAST  - February 03, 2010 [DATE]
RESULT:
HISTORY: Pain.

[Series 2: T2 · sagittal · 4.0mm · 0.88mm/px · 6 of 14 slices shown (1 of 2)]
[im 1/14]
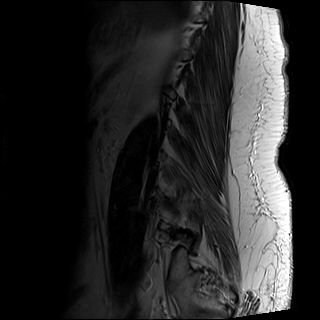
[im 3/14]
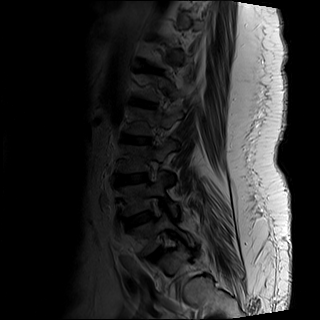
[im 6/14]
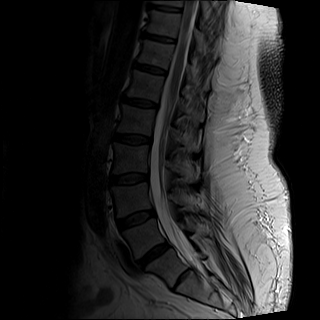
[im 8/14]
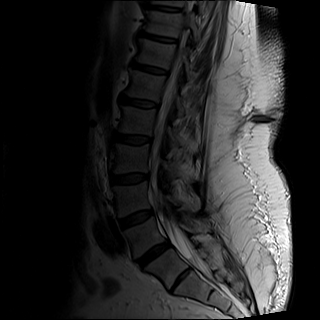
[im 11/14]
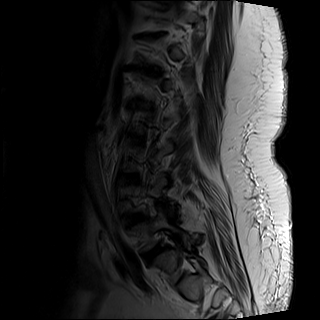
[im 14/14]
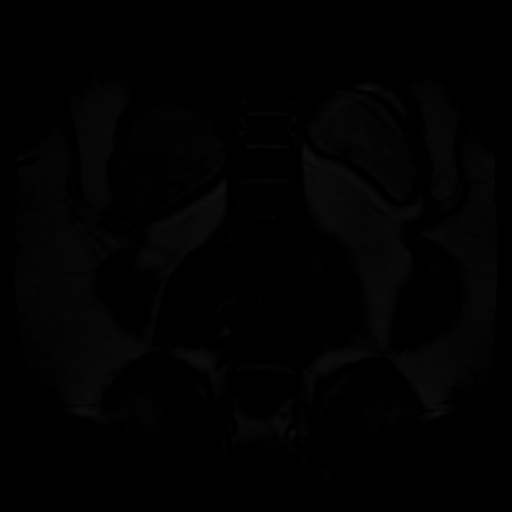

[Series 3: T1 · sagittal · 4.0mm · 0.44mm/px · 6 of 14 slices shown (1 of 2)]
[im 1/14]
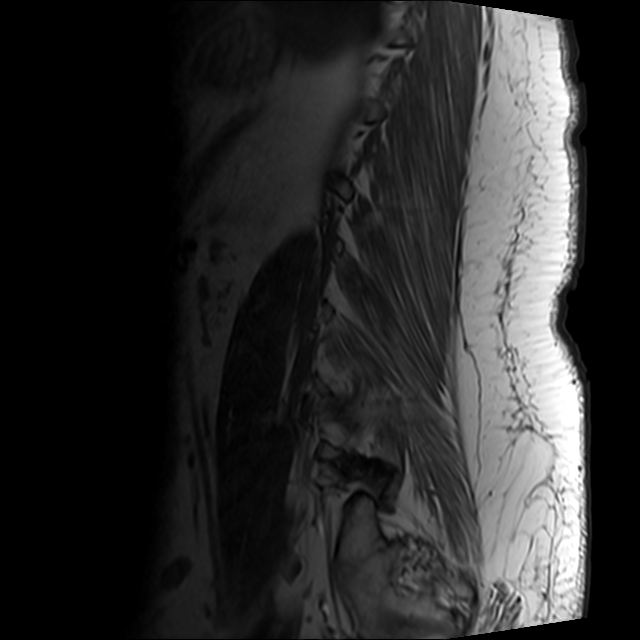
[im 3/14]
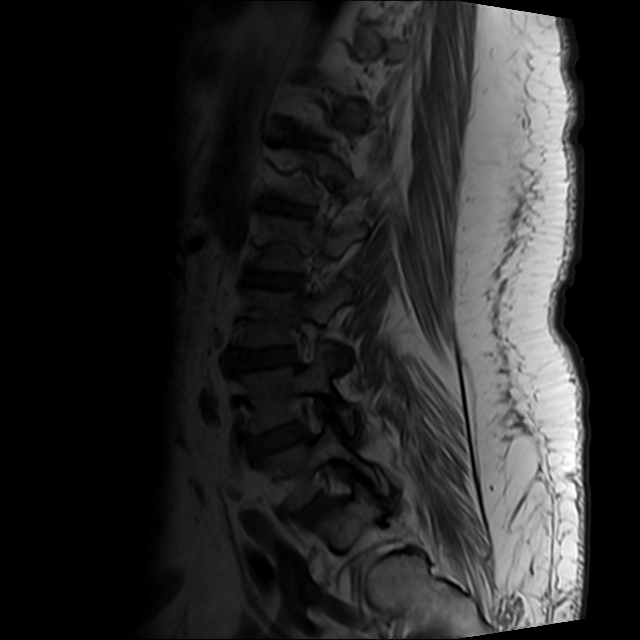
[im 6/14]
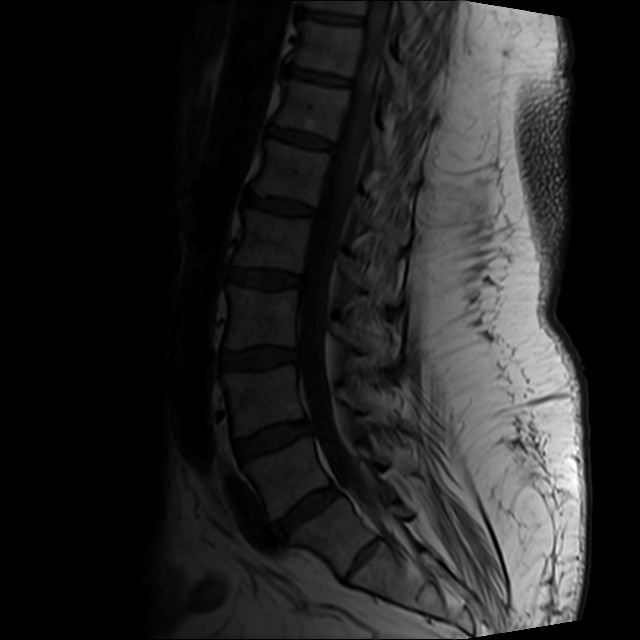
[im 8/14]
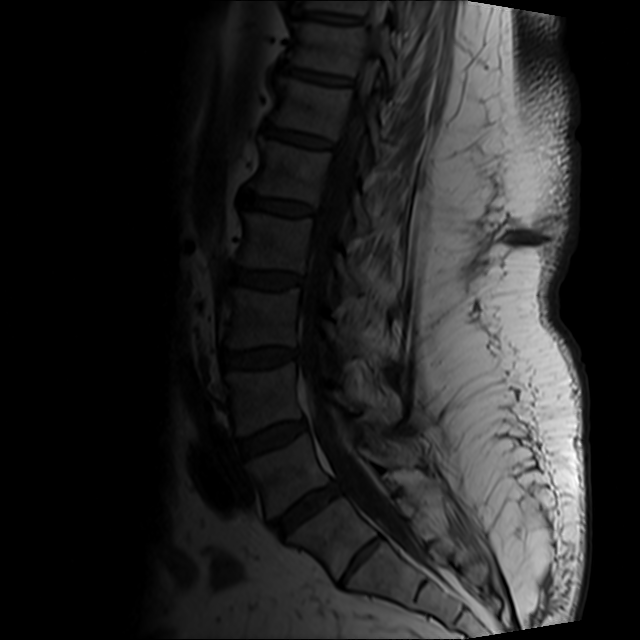
[im 11/14]
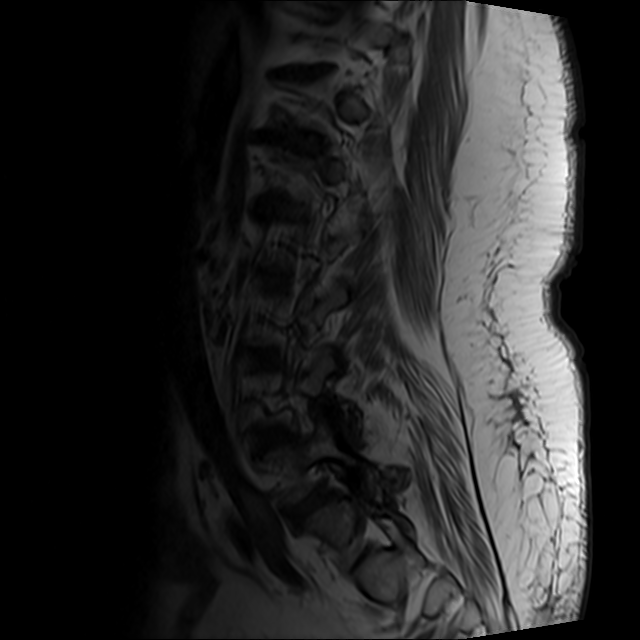
[im 14/14]
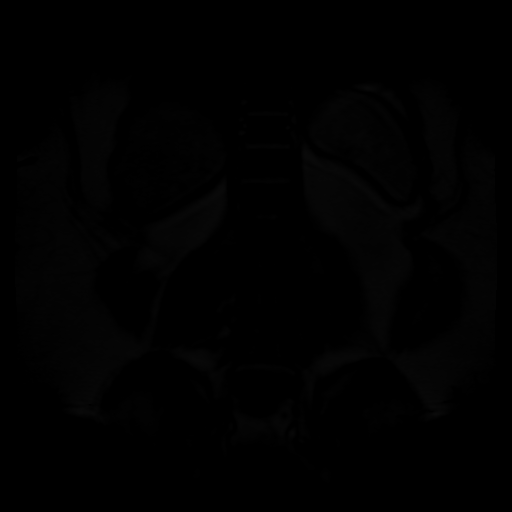

[Series 5: T1 · axial · 4.0mm · 0.32mm/px · z∈[-157,+24]mm · 3 of 37 slices shown (2 of 2)]
[im 6/37]
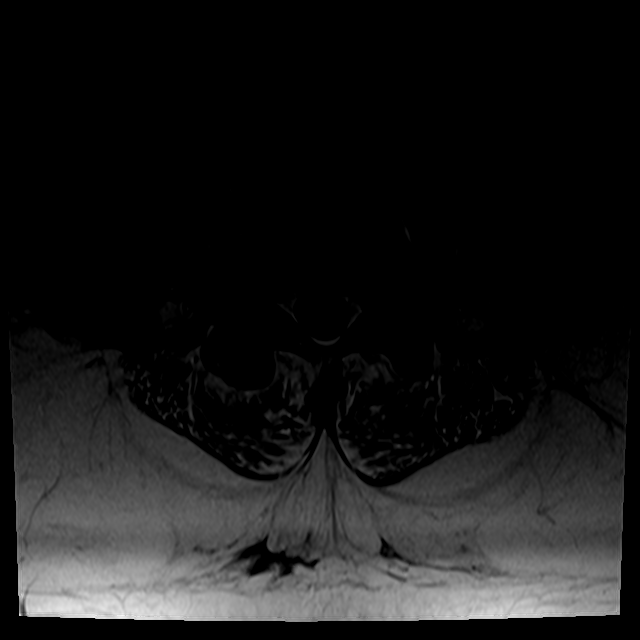
[im 19/37]
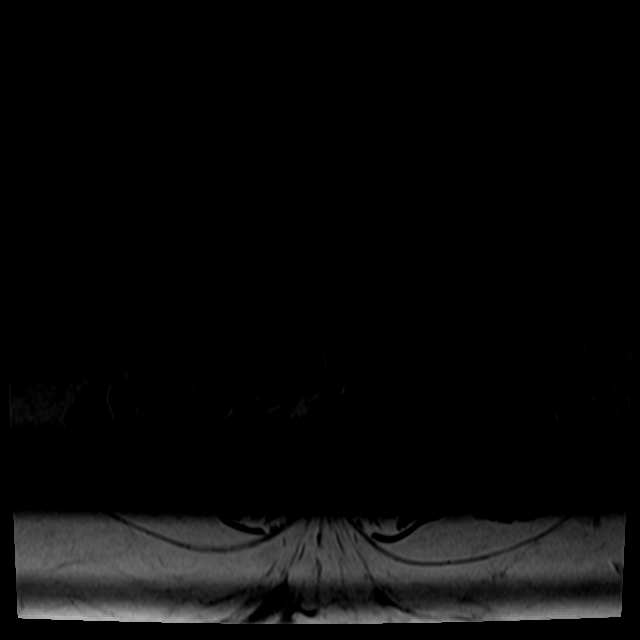
[im 31/37]
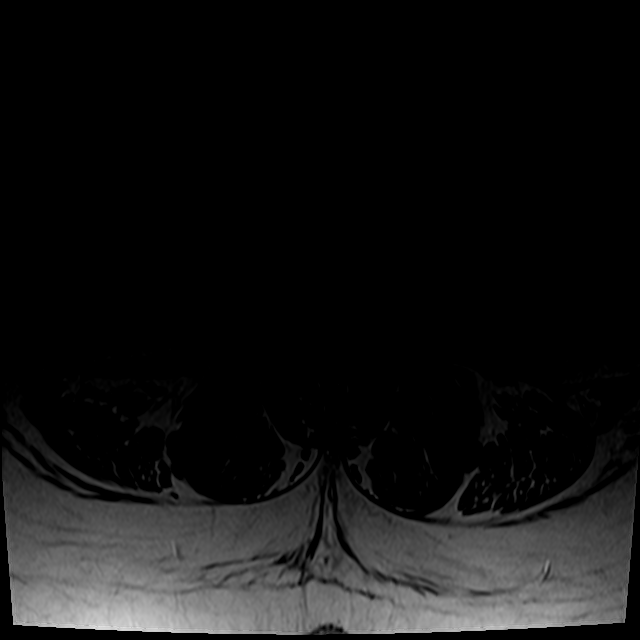

[Series 6: T2 · axial · 4.0mm · 0.78mm/px · z∈[-176,+120]mm · 9 of 37 slices shown (2 of 2)]
[im 1/37]
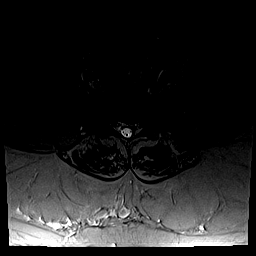
[im 6/37]
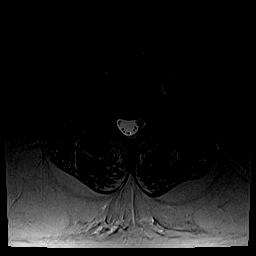
[im 11/37]
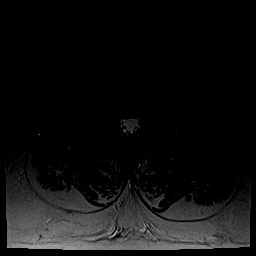
[im 16/37]
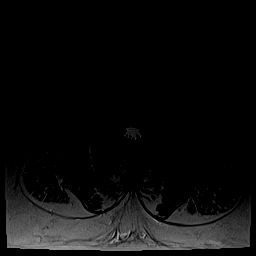
[im 19/37]
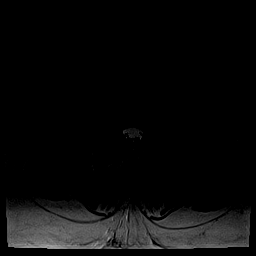
[im 21/37]
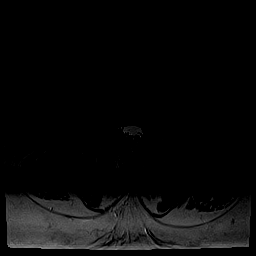
[im 26/37]
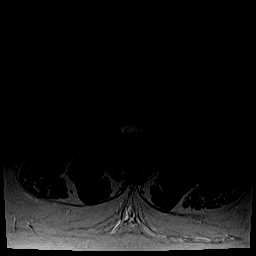
[im 31/37]
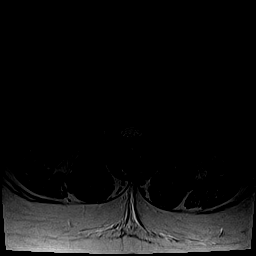
[im 37/37]
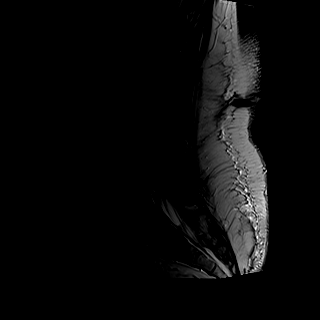

[24 of 48 positions shown; findings below may reference images not displayed]

COMPARISON STUDIES:  Prior MRI lumbar spine of 11/21/2007.

PROCEDURE AND FINDINGS:  Standard lumbar spine MRI is obtained. Lumbar
vertebrae are numbered with the lowest lumbar shaped vertebra as L5. The
lumbar cord and bony structures are normal.

At L1-2, no significant abnormality.

At L2-3, no significant abnormality.

At L3-4, no significant abnormality.

L4-5 facet hypertrophy with mild bilateral neural foraminal narrowing is
present. Mild annular bulge is present.

L5-S1 facet hypertrophy with mild annular bulge and mild neuroforaminal
narrowing noted on the right and left.

The exam is stable from prior exam.
IMPRESSION: Multilevel disc degeneration with level specific findings
as above. No prominent disc protrusion or spinal stenosis noted.

## 2011-02-28 IMAGING — MR MRI CERVICAL SPINE WITHOUT CONTRAST
5 series · 37 of 48 positions shown · non-contrast
Comparison: none

REASON FOR EXAM: low back pain and neck pain
COMMENTS:

PROCEDURE:     MMR - MMR CERVICAL SPINE WO CONT  - February 03, 2010 [DATE]
RESULT:
HISTORY: Pain.

[Series 2: T2 · sagittal · 3.0mm · 0.69mm/px · 8 of 14 slices shown (1 of 3)]
[im 1/14]
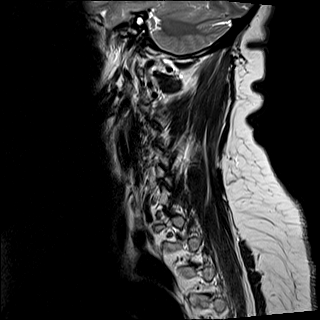
[im 2/14]
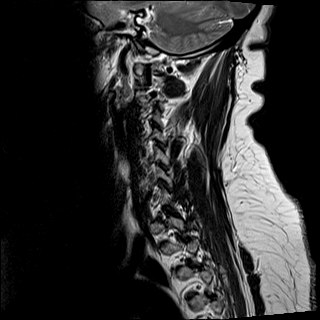
[im 4/14]
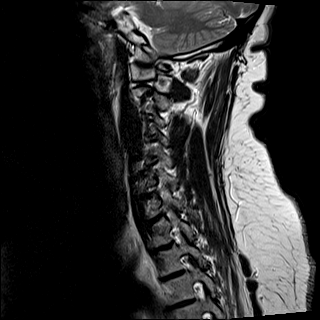
[im 6/14]
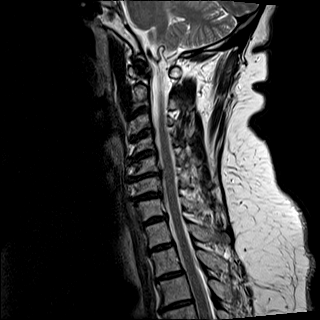
[im 8/14]
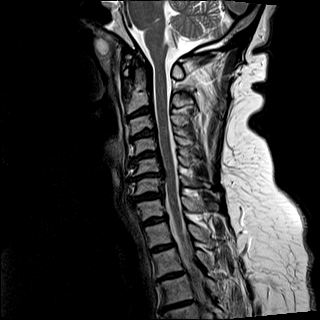
[im 10/14]
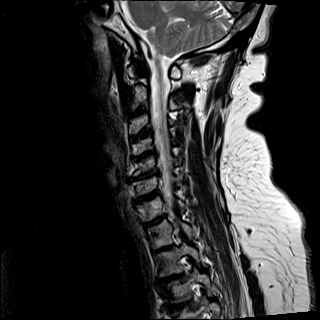
[im 12/14]
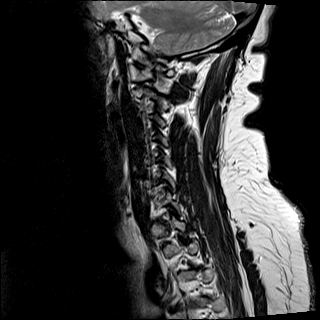
[im 14/14]
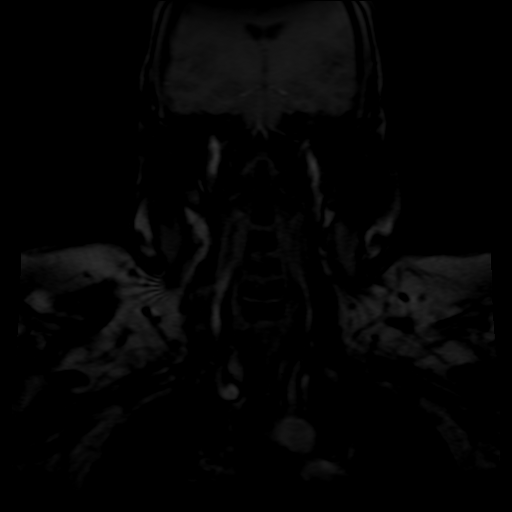

[Series 3: T1 · sagittal · 3.0mm · 0.62mm/px · 7 of 14 slices shown]
[im 1/14]
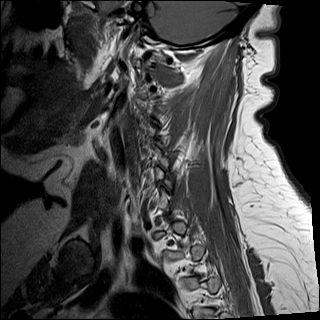
[im 3/14]
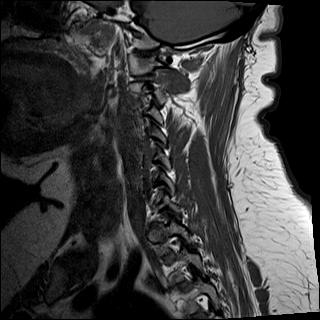
[im 5/14]
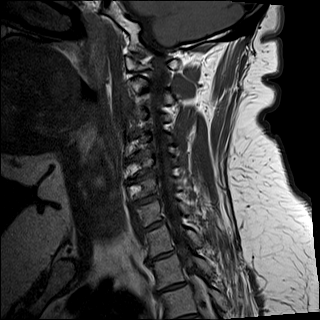
[im 7/14]
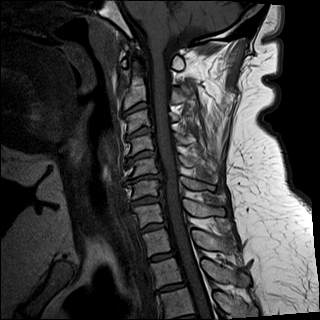
[im 9/14]
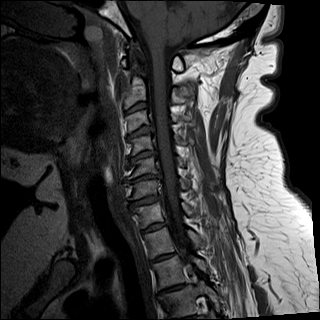
[im 11/14]
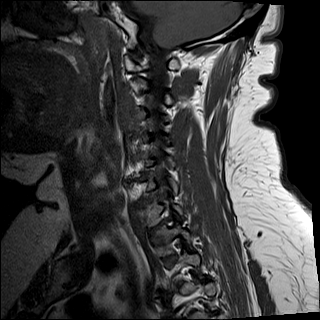
[im 14/14]
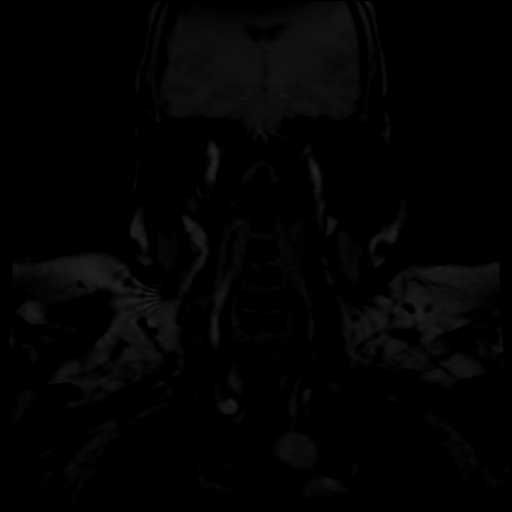

[Series 5: T2 · axial · 3.0mm · 0.62mm/px · z∈[-93,+103]mm · 9 of 26 slices shown (2 of 3)]
[im 1/26]
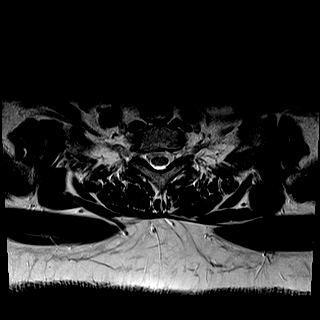
[im 5/26]
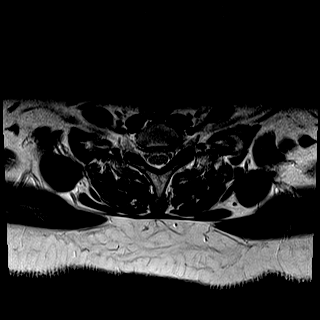
[im 9/26]
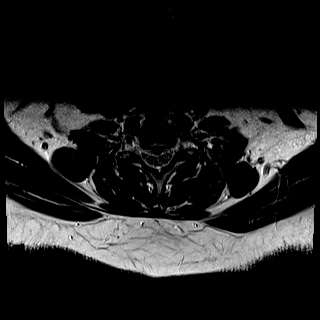
[im 11/26]
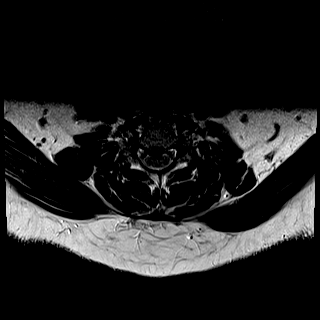
[im 13/26]
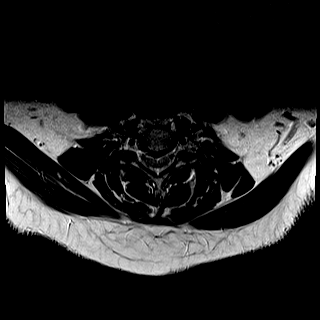
[im 15/26]
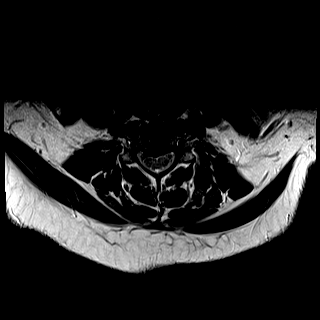
[im 17/26]
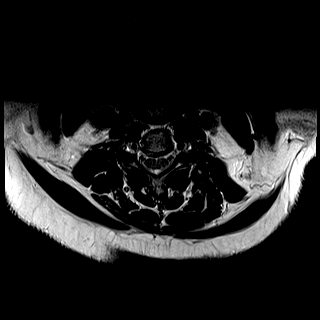
[im 21/26]
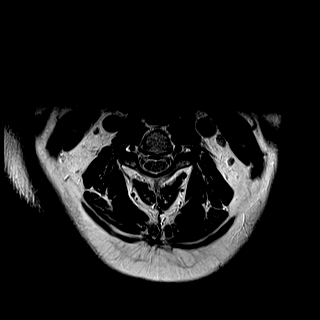
[im 26/26]
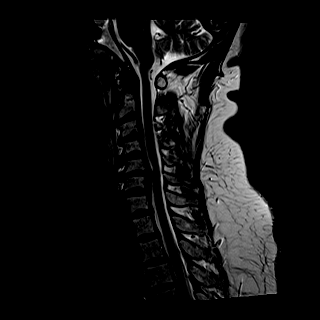

[Series 6: GRE · axial · 3.0mm · 0.35mm/px · z∈[-86,-25]mm · 6 of 26 slices shown]
[im 1/26]
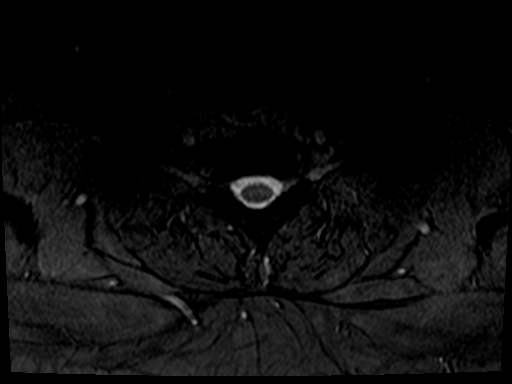
[im 5/26]
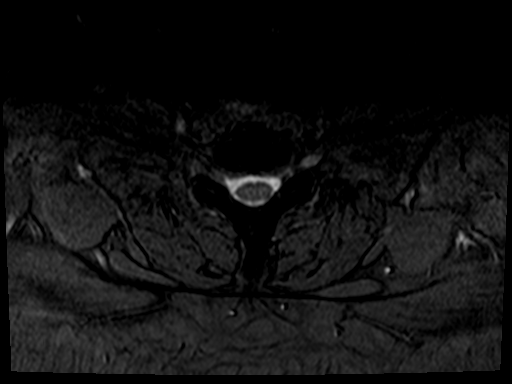
[im 9/26]
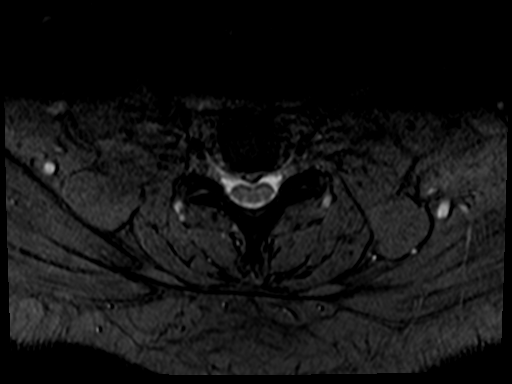
[im 11/26]
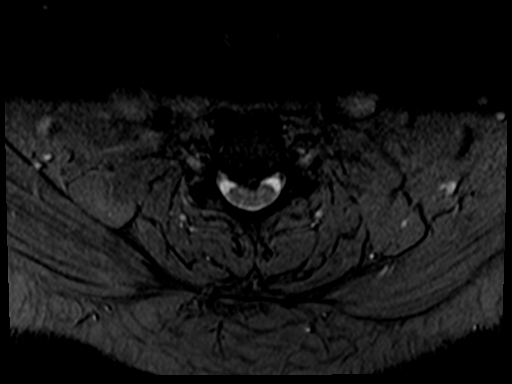
[im 15/26]
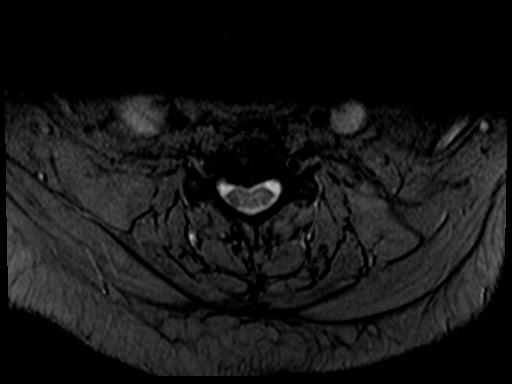
[im 17/26]
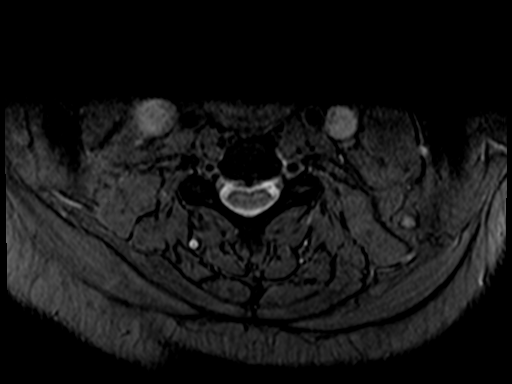

[Series 5003: T2 · sagittal · 3.0mm · 0.62mm/px · 7 of 14 slices shown (3 of 3)]
[im 1/14]
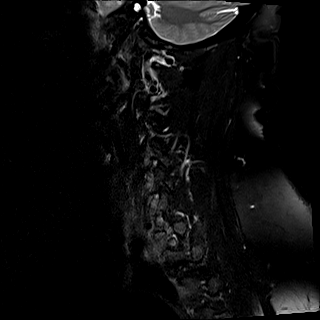
[im 3/14]
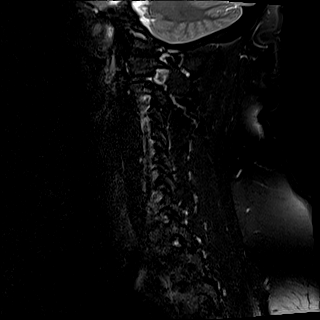
[im 5/14]
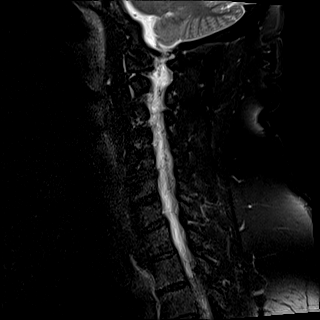
[im 7/14]
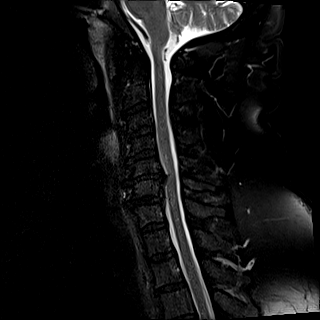
[im 9/14]
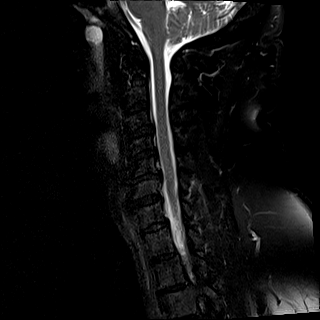
[im 11/14]
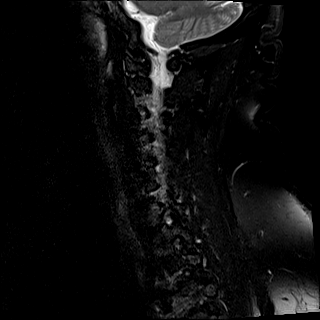
[im 14/14]
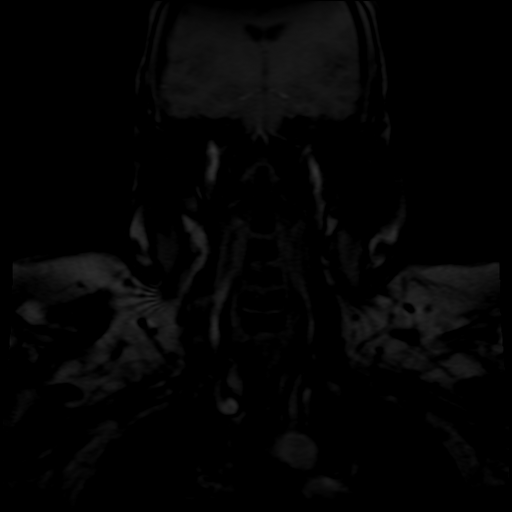

[37 of 48 positions shown; findings below may reference images not displayed]

COMPARISON STUDIES:  Plain films of the cervical spine of 08/25/2008.

PROCEDURE AND FINDINGS:  Standard MRI obtained. No acute bony abnormality.
Intrinsically, the cervical cord is normal. The craniovertebral junction is
normal.

C2-3, tiny central disc protrusion. No spinal stenosis or neural foraminal
narrowing.

C3-4, mild annular bulge and/or tiny central disc protrusion. No significant
spinal stenosis or neural foraminal narrowing.

C4-5, tiny central disc protrusion. No spinal stenosis or neural foraminal
narrowing.

C5-6, large central disc protrusion with deformity of the thoracic cord.
Thecal sac AP diameter is 7 mm. The neural foramina are patent.

C6-7, moderate size central disc protrusion with deformity of the thecal
sac. AP diameter of thecal sac is 8.5 mm. The neural foramina are patent.

There is C7-T1 annular bulge with mild narrowing of the left neural foramen
at this level.
IMPRESSION: 1.  Prominent C5-6 central disc protrusion with spinal stenosis and cervical
cord deformity. The neural foramina are patent at this level.
2.  Moderate C6-7 disc protrusion with mild spinal stenosis at this level.
3.  Annular bulge at C7-T1 with mild left neural foraminal narrowing.
4.  Other mild findings as above.

## 2011-03-16 IMAGING — CR DG CHEST 2V
1 series · 2 of 2 positions shown · non-contrast
Comparison: none

REASON FOR EXAM: Shortness of Breath
COMMENTS:   May transport without cardiac monitor

[Series 1: view not recorded · 0.17mm/px · 2 of 2 slices shown]
[im 1/2]
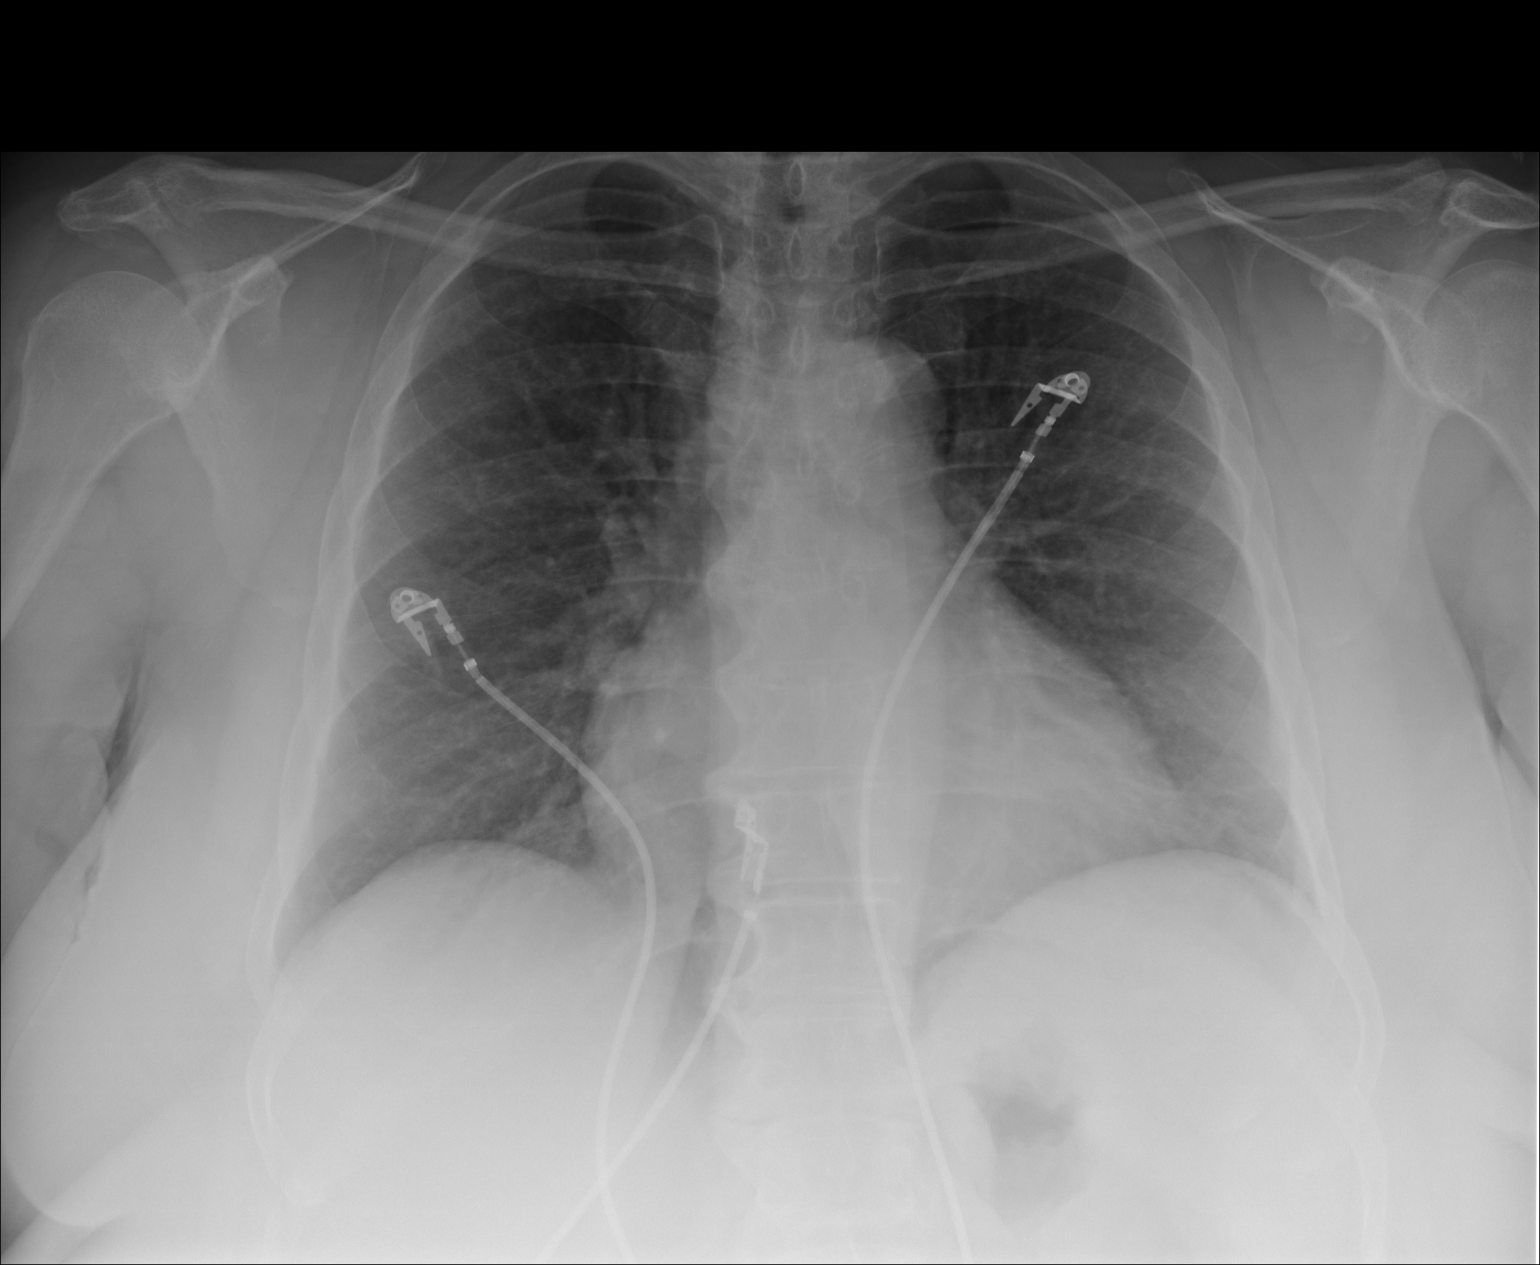
[im 2/2]
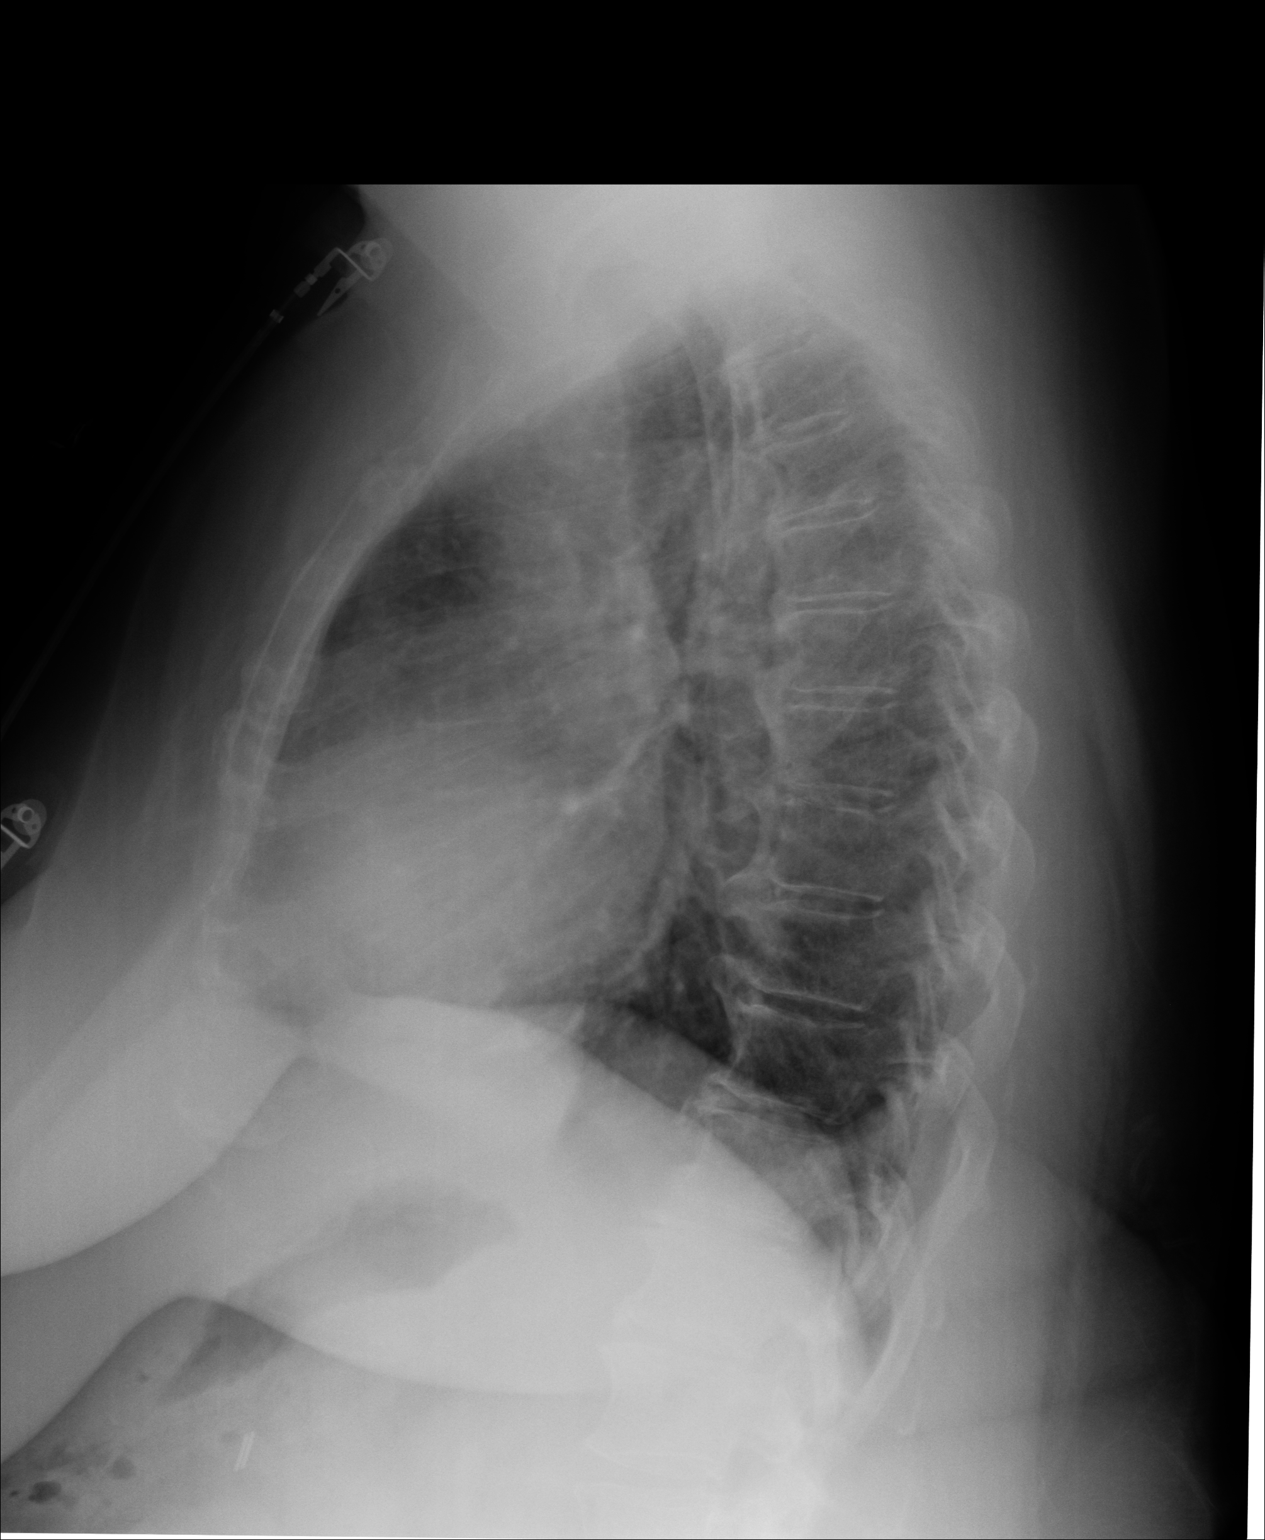

[2 of 2 positions shown; findings below may reference images not displayed]

PROCEDURE:     DXR - DXR CHEST PA (OR AP) AND LATERAL  - February 19, 2010  [DATE]

RESULT:     Comparison is made to the prior exam of 08/20/2008. There is
slight prominence of the pulmonary vascularity suspicious for early
pulmonary vascular congestion. No frank pulmonary edema or pleural effusion
is seen. The heart is upper limits for normal in size or mildly enlarged but
stable in size as compared to the exam of 08/20/2008. Monitoring electrodes
are present. No acute bony abnormalities are seen.
IMPRESSION: 1. No pneumonia is identified.
2. There is mild prominence of the pulmonary vascularity suspicious for
early or minimal pulmonary vascular congestion. Continued follow-up is
recommended if symptomatology persists.

## 2011-04-24 ENCOUNTER — Emergency Department: Payer: Self-pay | Admitting: *Deleted

## 2011-05-26 IMAGING — CT CT HEAD WITHOUT CONTRAST
2 of 3 series · 16 of 30 positions shown, 19 images · non-contrast
Comparison: none

REASON FOR EXAM: ha
COMMENTS:

PROCEDURE:     CT  - CT HEAD WITHOUT CONTRAST  - May 01, 2010 [DATE]
RESULT:     Comparison:  01/18/2010
TECHNIQUE: Multiple axial images from the foramen magnum to the vertex were
obtained without IV contrast.

[Series 2: without · axial · non-contrast · 0.39mm/px · z∈[-138,-18]mm · 9 of 31 slices shown, 12 images (1 of 2)]
[im 4/31  brain]
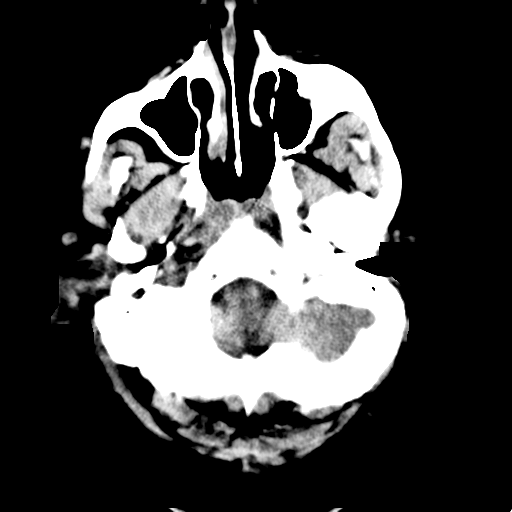
[im 4/31  bone]
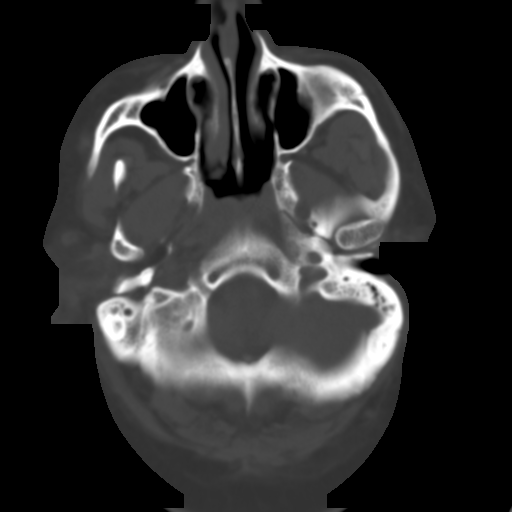
[im 7/31  brain]
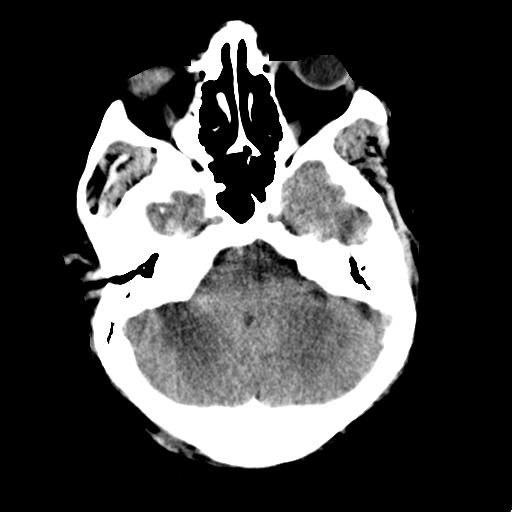
[im 10/31  brain]
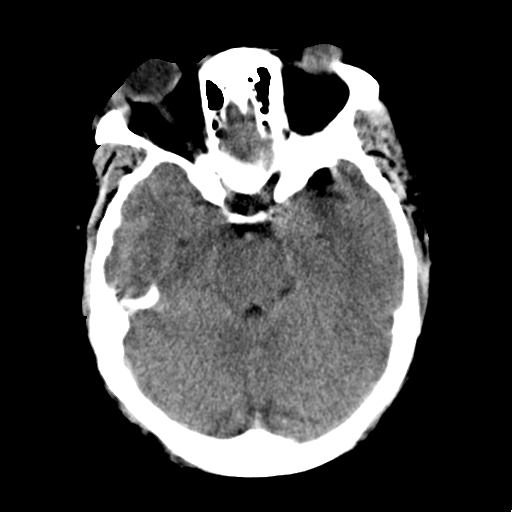
[im 13/31  brain]
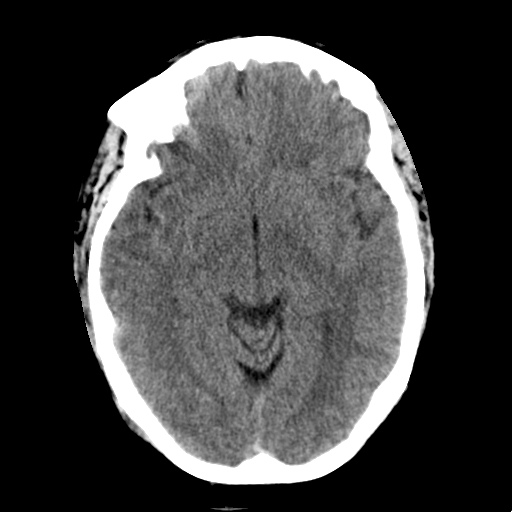
[im 16/31  brain]
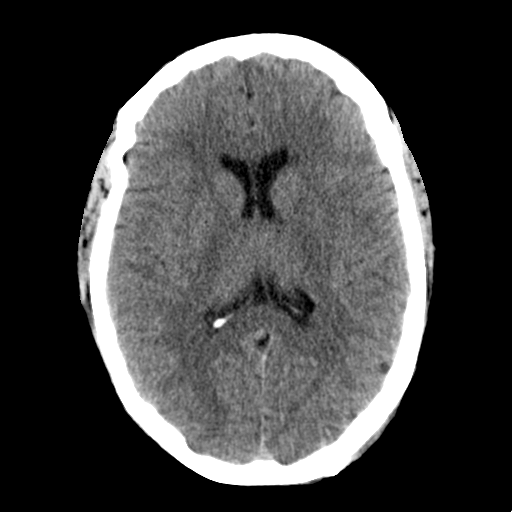
[im 16/31  bone]
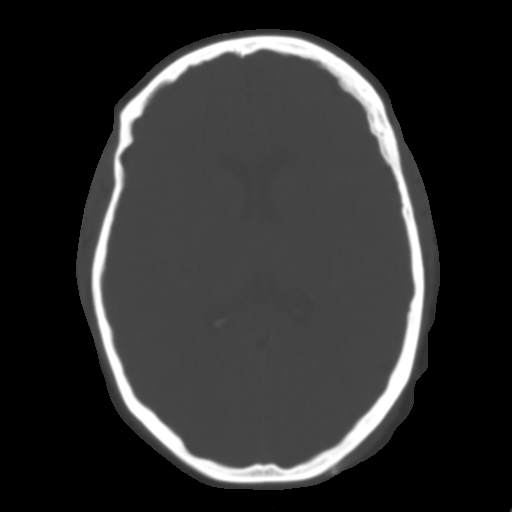
[im 19/31  brain]
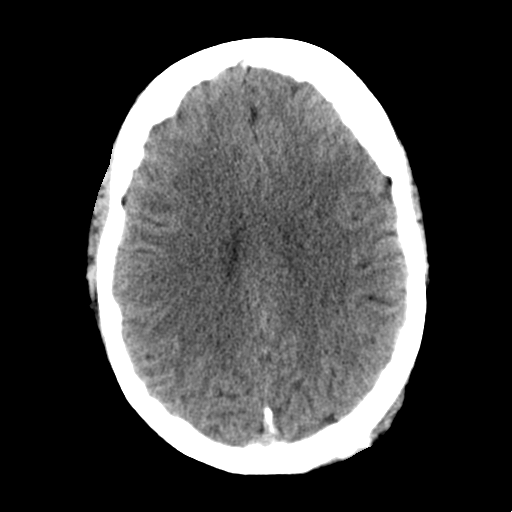
[im 22/31  brain]
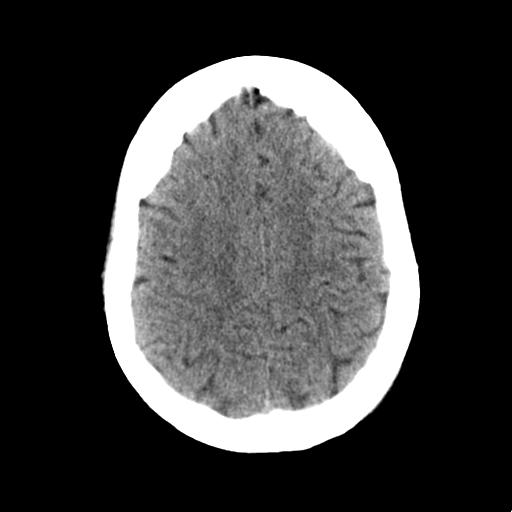
[im 25/31  brain]
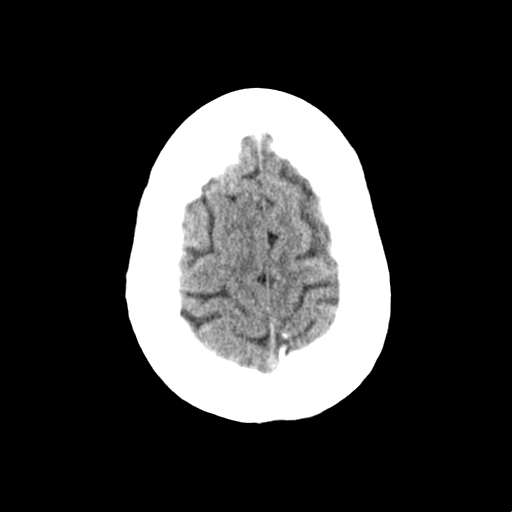
[im 28/31  brain]
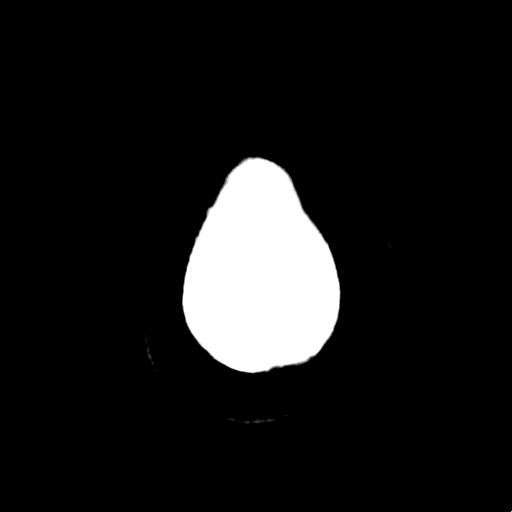
[im 28/31  bone]
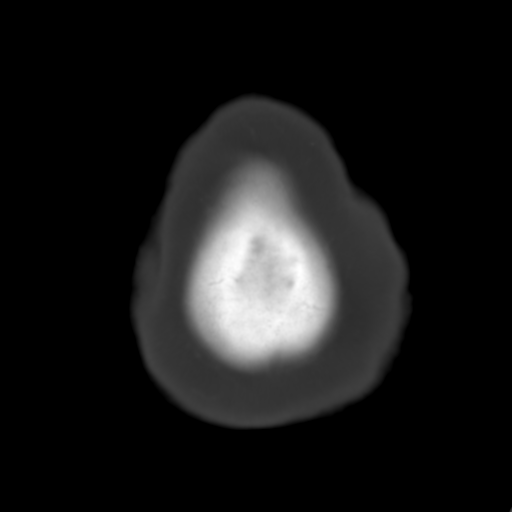

[Series 4: without · axial · non-contrast · 0.39mm/px · z∈[-96,+2]mm · 7 of 28 slices shown (2 of 2)]
[im 4/28  brain]
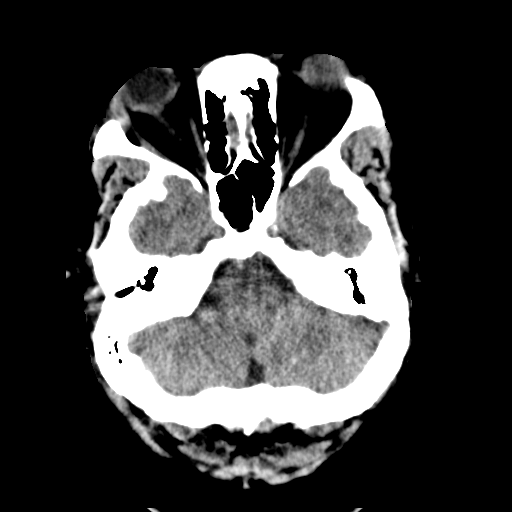
[im 7/28  brain]
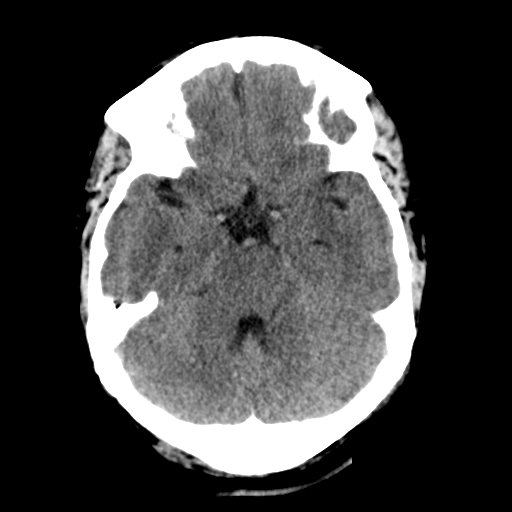
[im 11/28  brain]
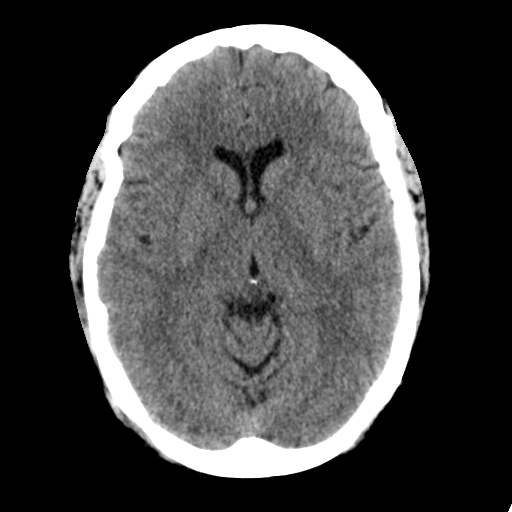
[im 14/28  brain]
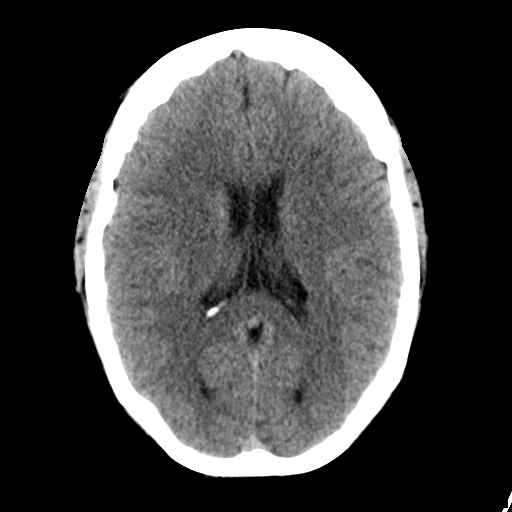
[im 17/28  brain]
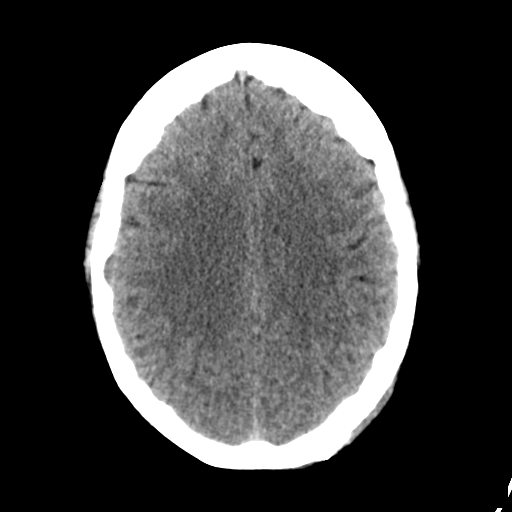
[im 21/28  brain]
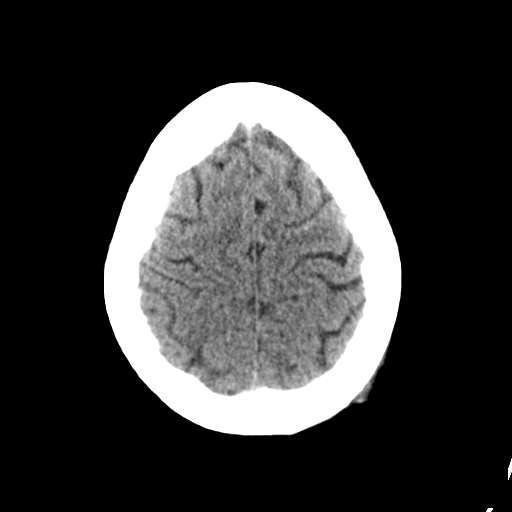
[im 24/28  brain]
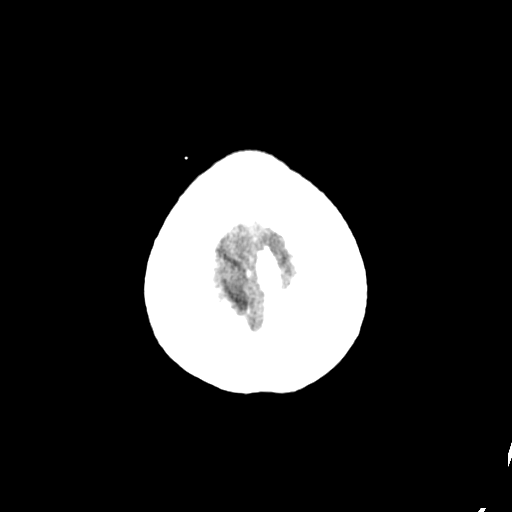

[16 of 30 positions shown; findings below may reference images not displayed]

FINDINGS: There is no evidence of mass effect, midline shift, or extra-axial fluid
collections.  There is no evidence of a space-occupying lesion or
intracranial hemorrhage. There is no evidence of a cortical-based area of
acute infarction.

The ventricles and sulci are appropriate for the patient's age. The basal
cisterns are patent.

Visualized portions of the orbits are unremarkable. The visualized portions
of the paranasal sinuses and mastoid air cells are unremarkable.

The osseous structures are unremarkable.
IMPRESSION: No acute intracranial process.

## 2011-05-26 IMAGING — CR DG CHEST 2V
1 series · 2 of 2 positions shown · non-contrast
Comparison: none

REASON FOR EXAM: weak
COMMENTS:

PROCEDURE:     DXR - DXR CHEST PA (OR AP) AND LATERAL  - May 01, 2010 [DATE]
RESULT:     Comparison: 02/19/2010

[Series 1: view not recorded · 0.17mm/px · 2 of 2 slices shown]
[im 1/2]
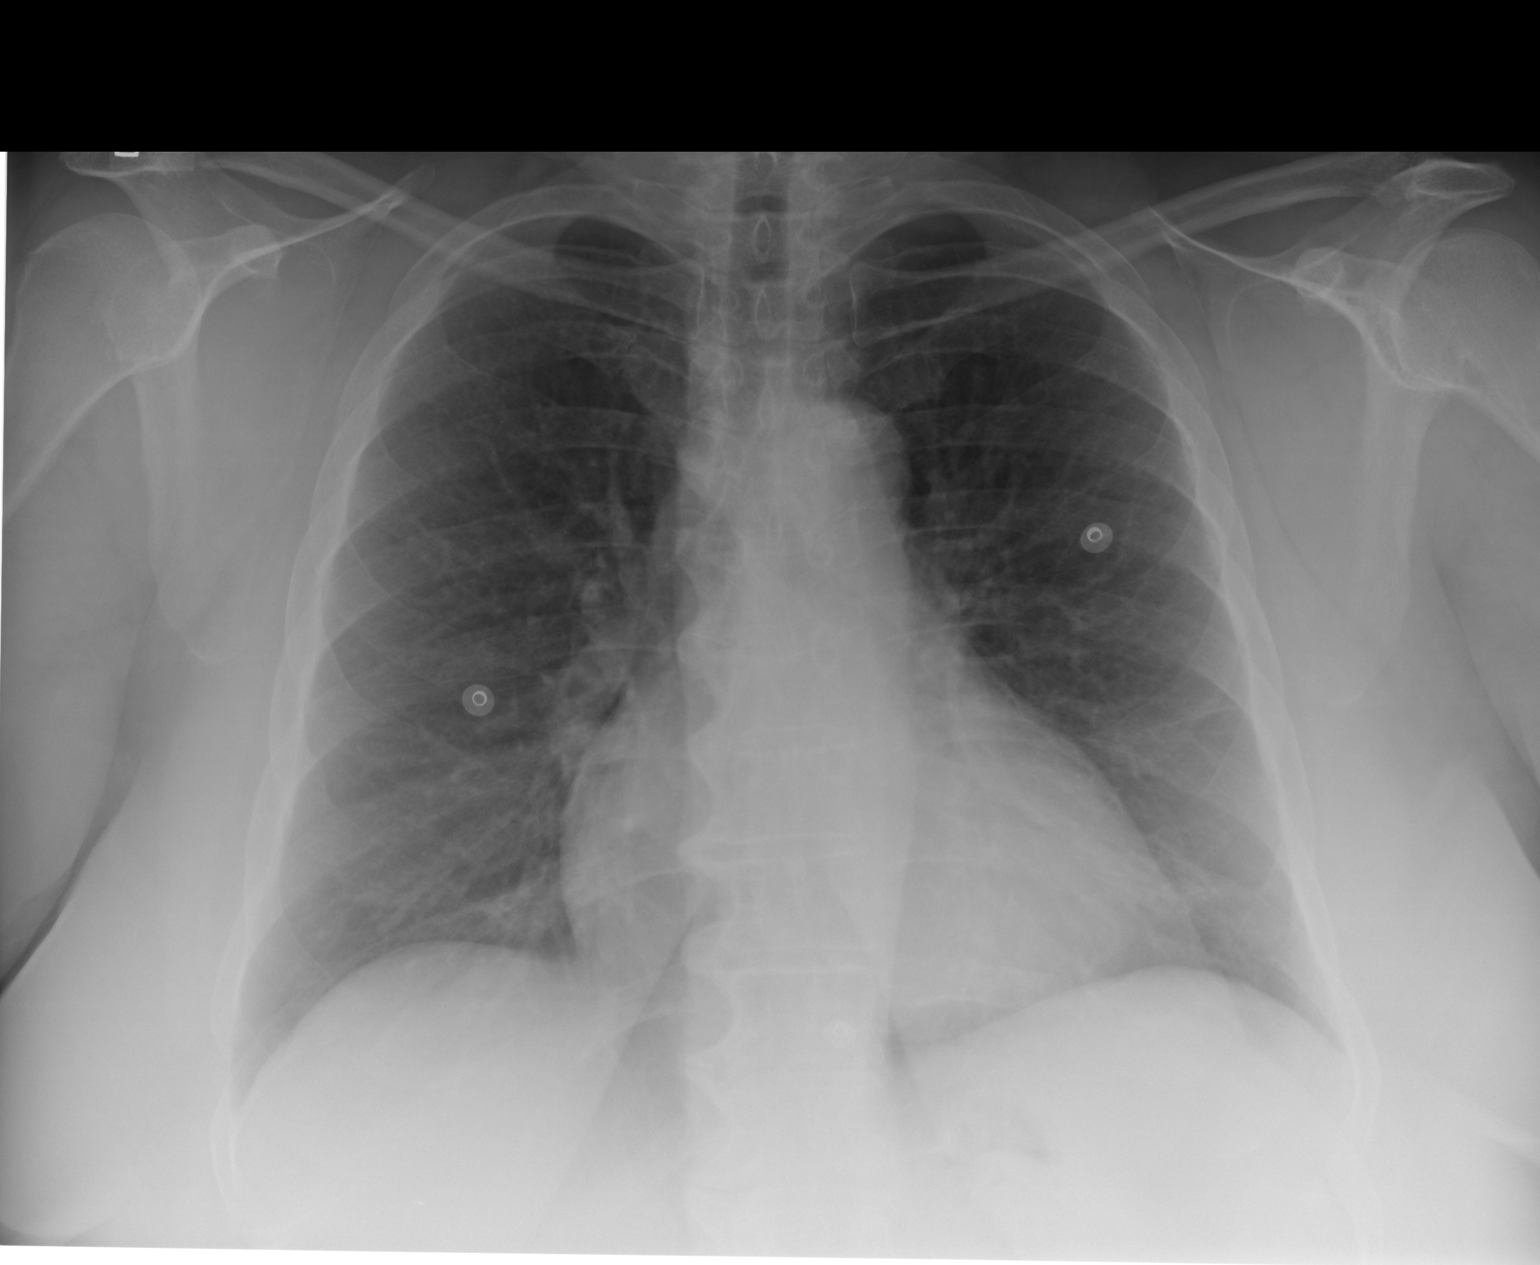
[im 2/2]
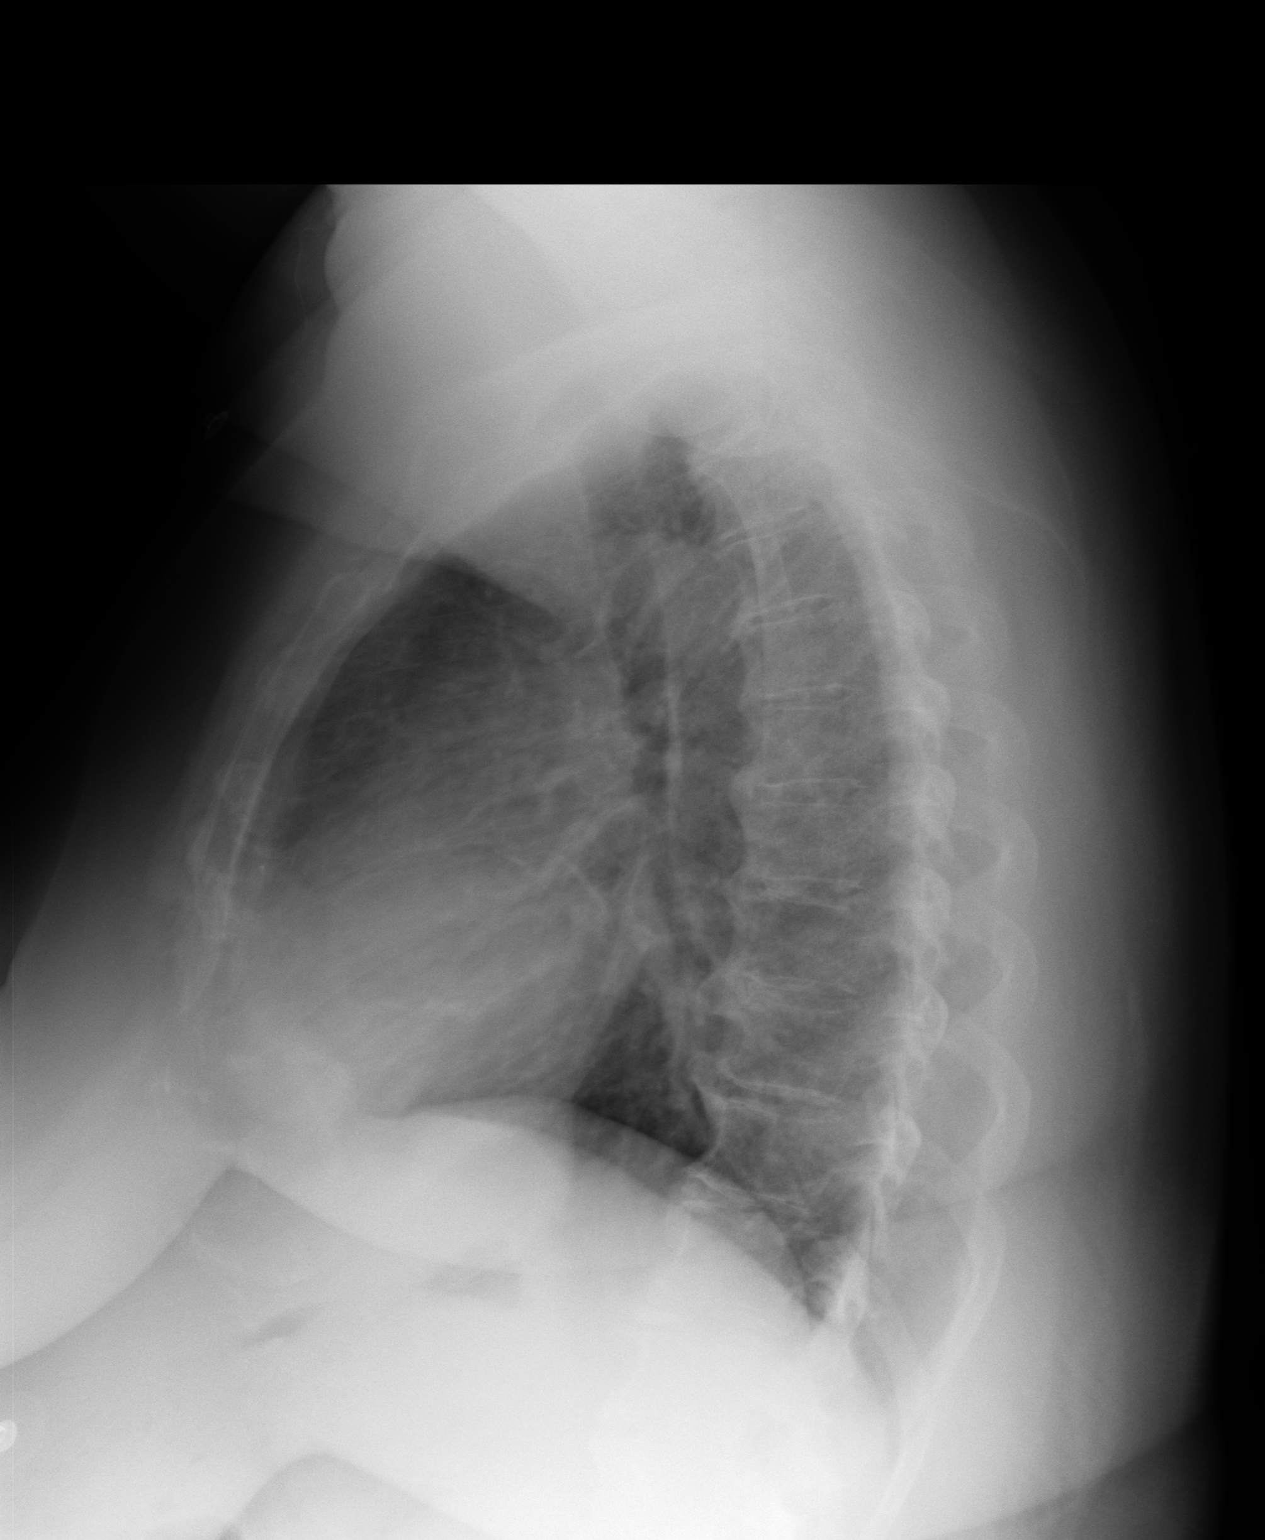

[2 of 2 positions shown; findings below may reference images not displayed]

FINDINGS: PA and lateral chest radiographs are provided.  There is no focal
parenchymal opacity, pleural effusion, or pneumothorax. The heart and
mediastinum are unremarkable.  The osseous structures are unremarkable.
IMPRESSION: No acute disease of the chest.

## 2011-06-18 ENCOUNTER — Emergency Department: Payer: Self-pay | Admitting: Internal Medicine

## 2011-10-01 ENCOUNTER — Emergency Department: Payer: Self-pay | Admitting: Emergency Medicine

## 2011-10-06 IMAGING — CT CT HEAD WITHOUT CONTRAST
2 series · 16 of 30 positions shown, 20 images · non-contrast
Comparison: none

REASON FOR EXAM: HTN, HA
COMMENTS:   May transport without cardiac monitor

[Series 2: without · axial · non-contrast · 0.44mm/px · z∈[+294,+414]mm · 13 of 29 slices shown, 17 images]
[im 3/29  brain]
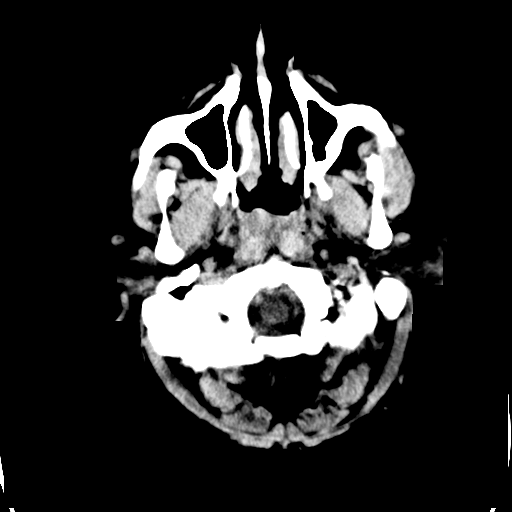
[im 3/29  bone]
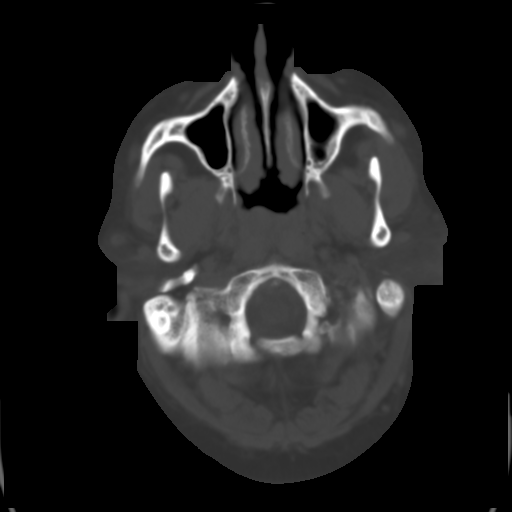
[im 5/29  brain]
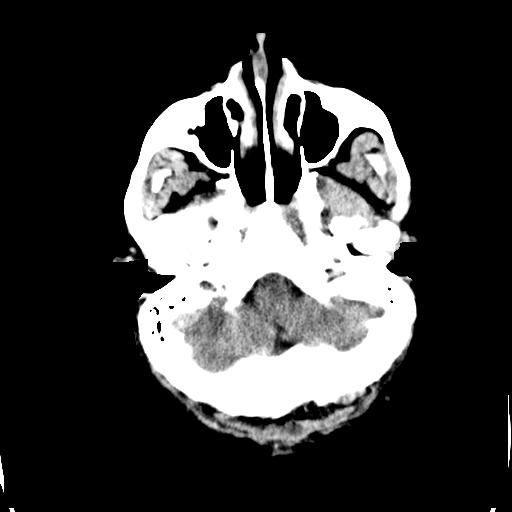
[im 7/29  brain]
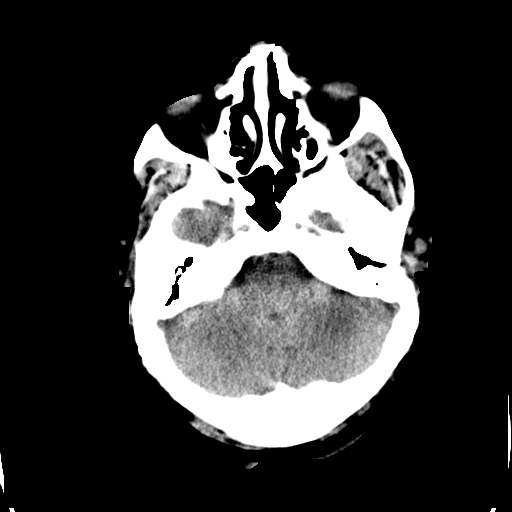
[im 9/29  brain]
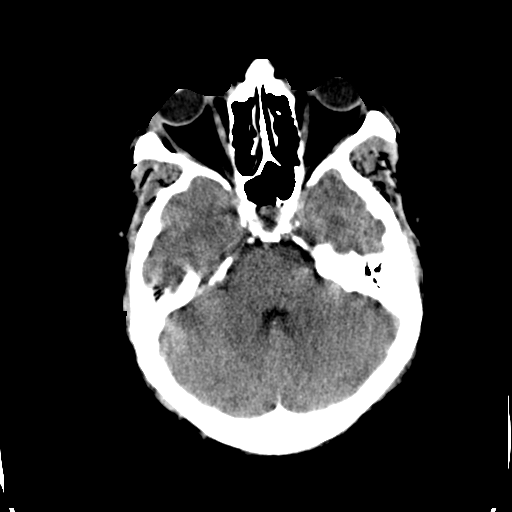
[im 11/29  brain]
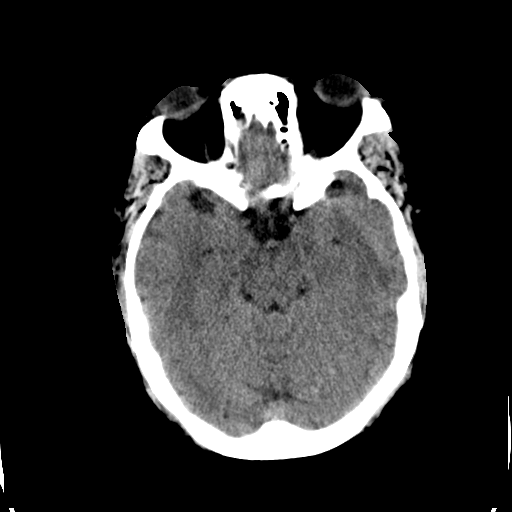
[im 11/29  bone]
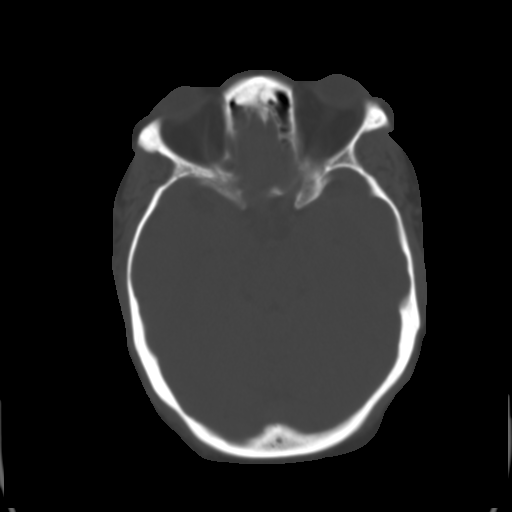
[im 13/29  brain]
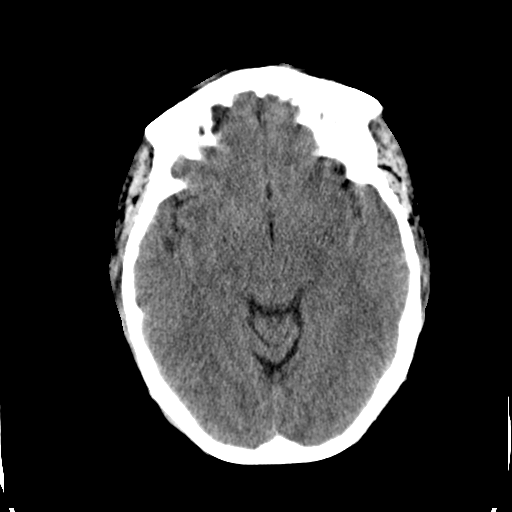
[im 15/29  brain]
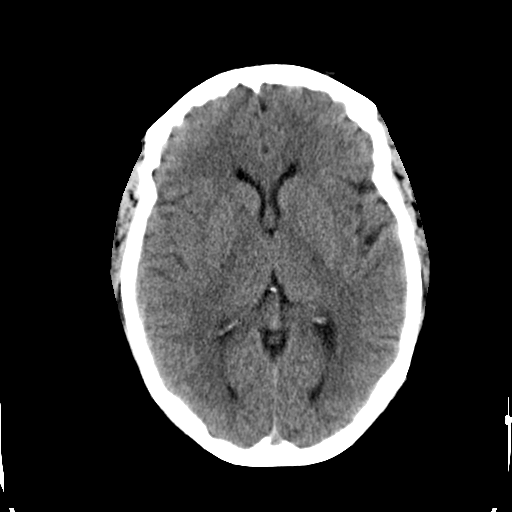
[im 17/29  brain]
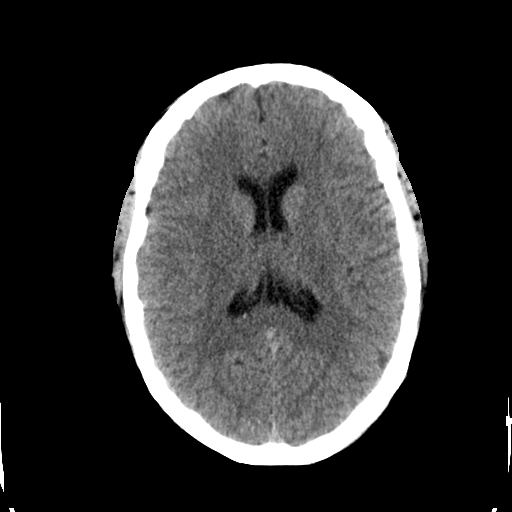
[im 19/29  brain]
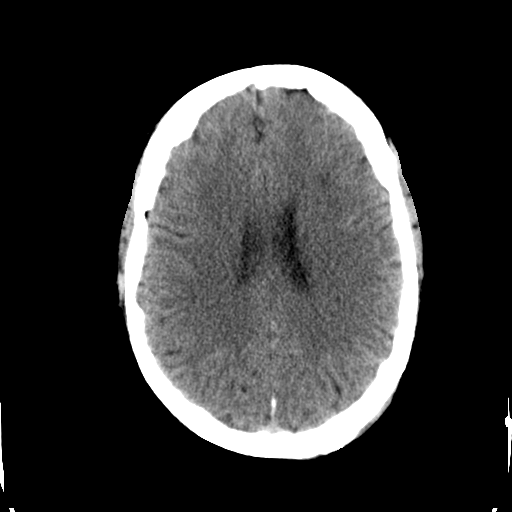
[im 19/29  bone]
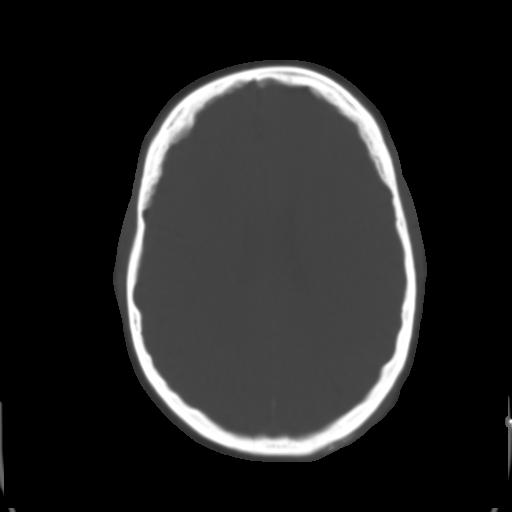
[im 21/29  brain]
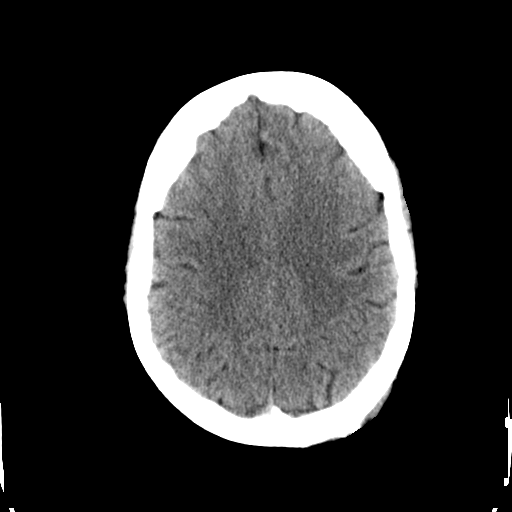
[im 23/29  brain]
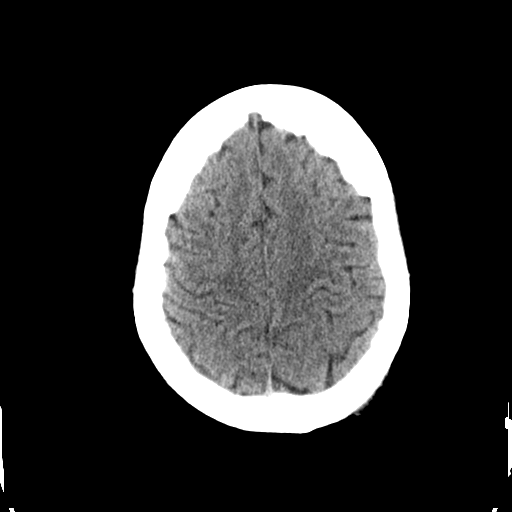
[im 25/29  brain]
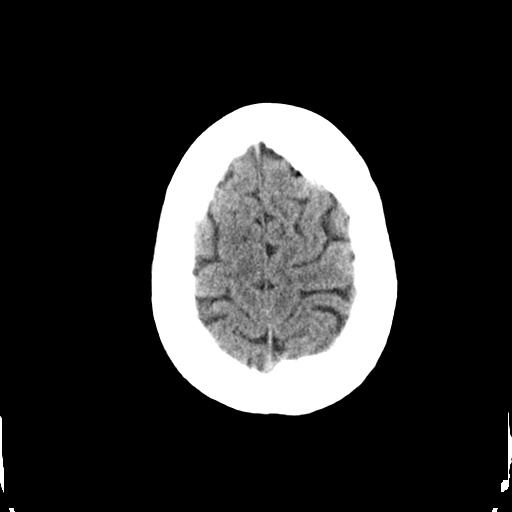
[im 27/29  brain]
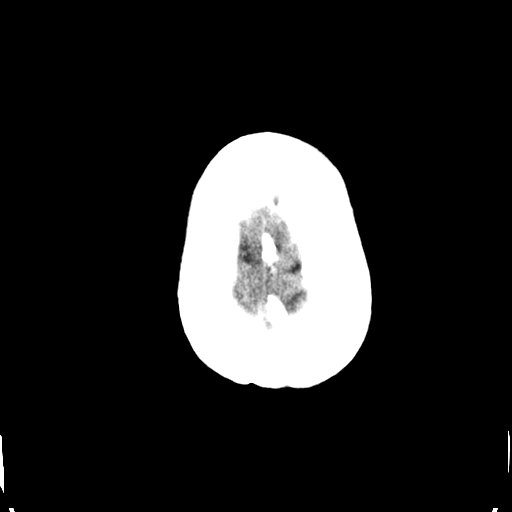
[im 27/29  bone]
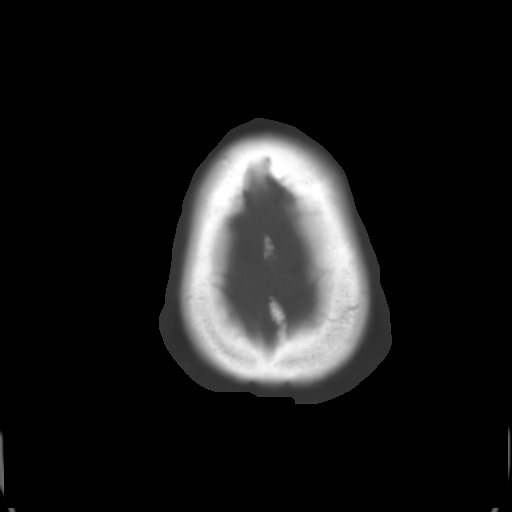

[Series 3: bone · axial · 0.44mm/px · z∈[+294,+334]mm · 3 of 29 slices shown]
[im 3/29  bone]
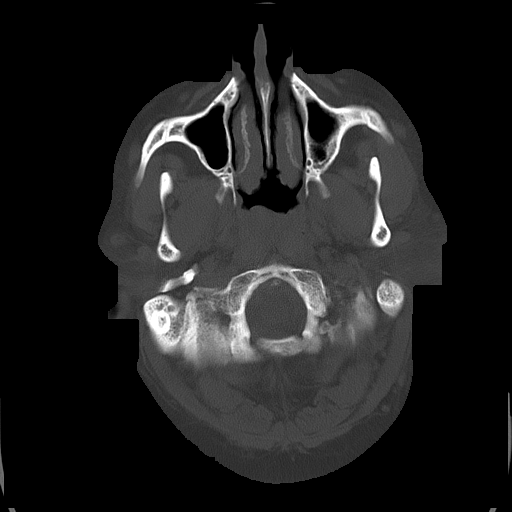
[im 7/29  bone]
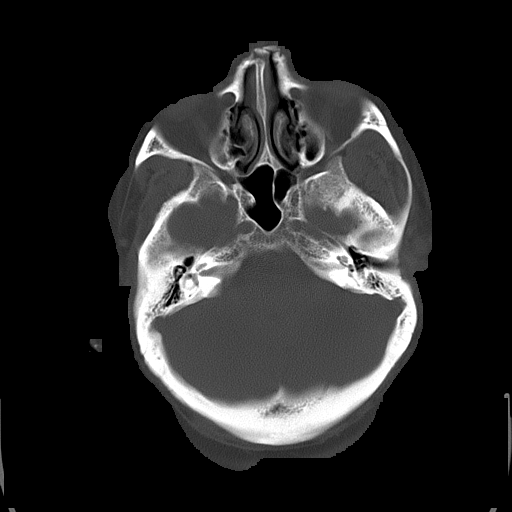
[im 11/29  bone]
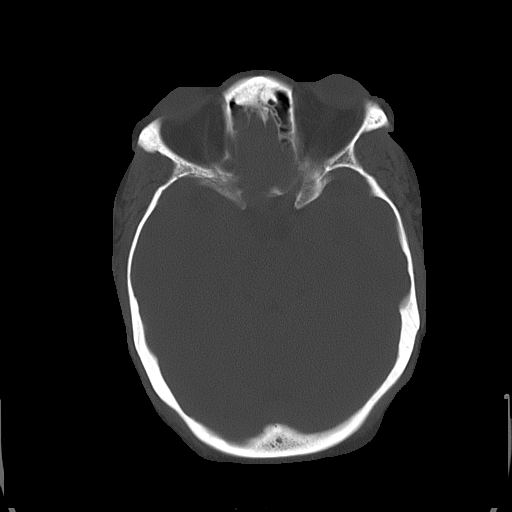

[16 of 30 positions shown; findings below may reference images not displayed]

PROCEDURE:     CT  - CT HEAD WITHOUT CONTRAST  - September 11, 2010  [DATE]

RESULT:     Axial noncontrast CT scanning was performed through the brain at
5 mm intervals and slice thicknesses. Comparison is made to the study 01 May, 2010.

The ventricles are normal in size and position. There is no intracranial
hemorrhage nor intracranial mass effect. The cerebellum and brainstem are
normal in density. There is no evidence of an evolving ischemic infarction.
Subtle decreased density in the deep white matter of both cerebral
hemispheres is consistent with chronic small vessel ischemic type change. At
bone window settings I do not see evidence of an acute skull fracture. The
observed portions of the paranasal sinuses are clear.
IMPRESSION: 1. I see no acute abnormality of the brain.
2. I see no acute paranasal sinus inflammation.

## 2012-02-25 IMAGING — CR DG CHEST 1V PORT
1 series · 1 of 1 positions shown · non-contrast
Comparison: none

REASON FOR EXAM: chest pain
COMMENTS:

PROCEDURE:     DXR - DXR PORTABLE CHEST SINGLE VIEW  - January 31, 2011 [DATE]
RESULT:     Comparison: None

[portable]
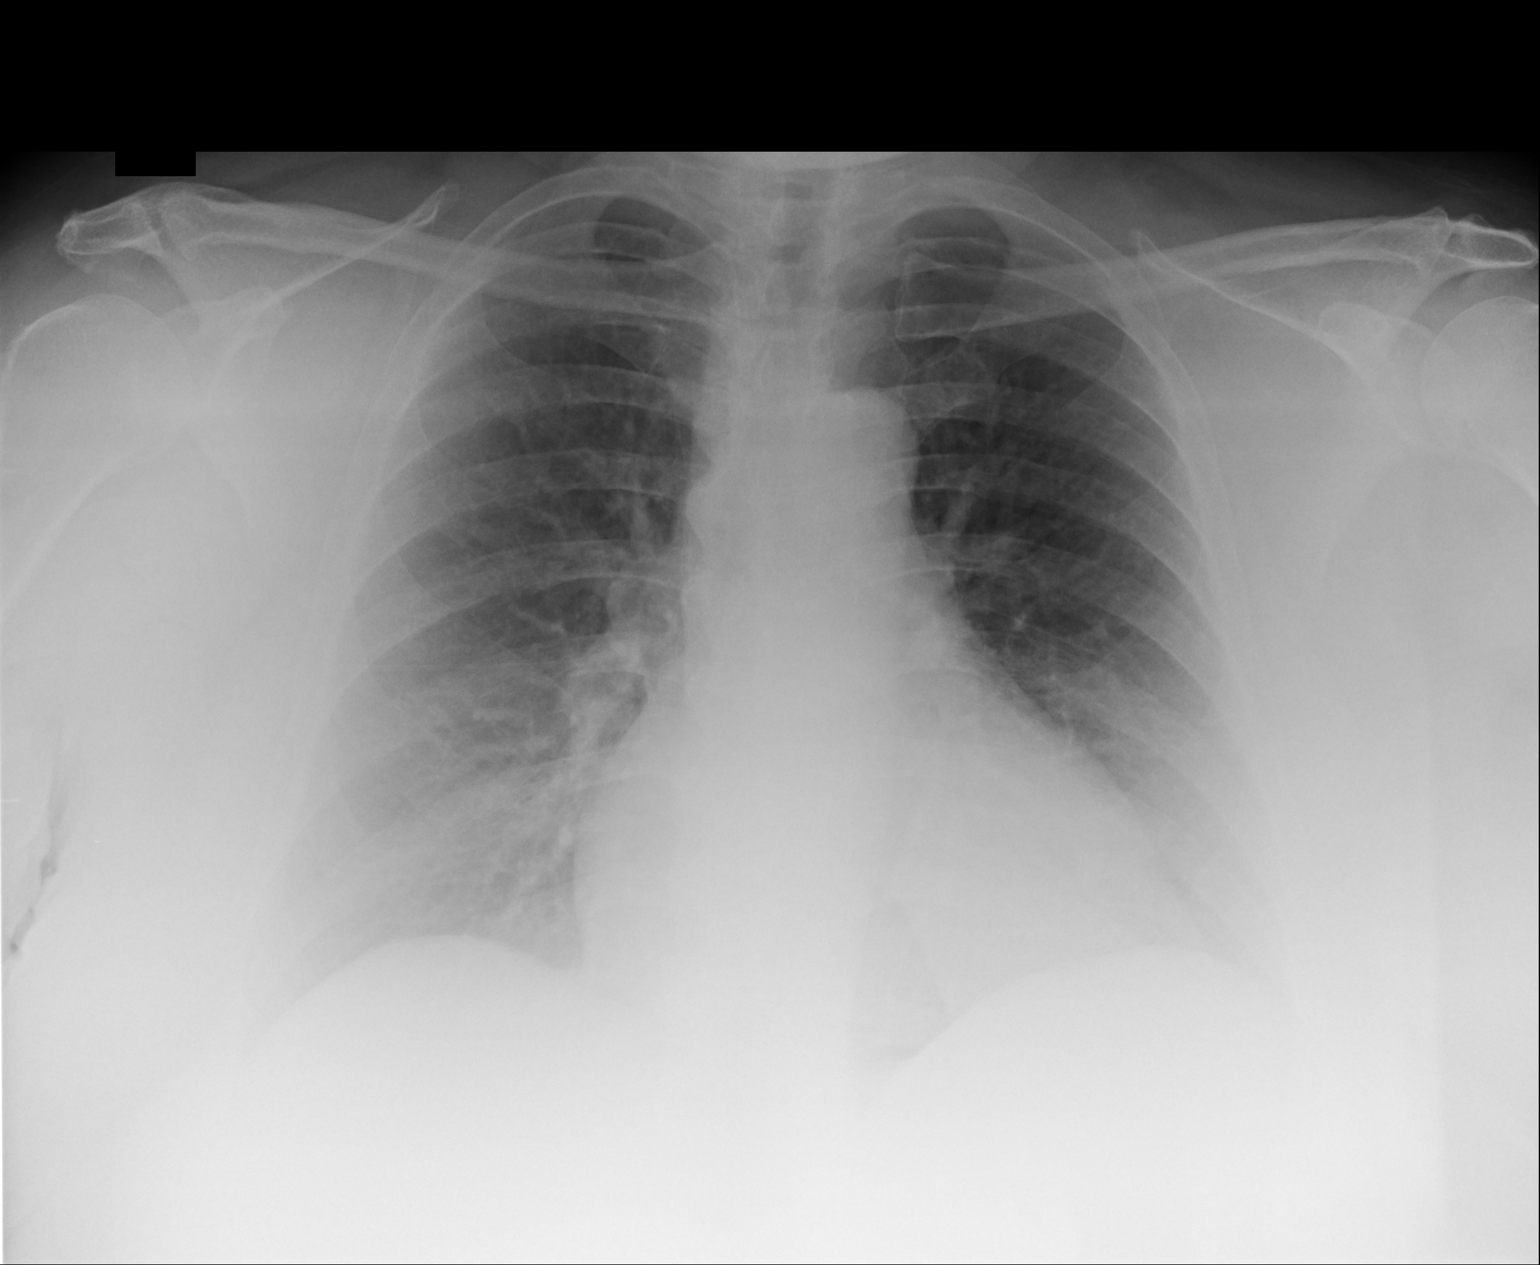

[1 of 1 positions shown; findings below may reference images not displayed]

FINDINGS: Single portable AP chest radiograph is provided.  There is no focal
parenchymal opacity, pleural effusion, or pneumothorax. Normal
cardiomediastinal silhouette. The osseous structures are unremarkable.
IMPRESSION: No acute disease of the chest.

## 2012-03-12 ENCOUNTER — Emergency Department: Payer: Self-pay | Admitting: Unknown Physician Specialty

## 2012-03-12 LAB — COMPREHENSIVE METABOLIC PANEL
Bilirubin,Total: 0.5 mg/dL (ref 0.2–1.0)
Chloride: 100 mmol/L (ref 98–107)
Co2: 28 mmol/L (ref 21–32)
Creatinine: 1.14 mg/dL (ref 0.60–1.30)
EGFR (African American): >60
EGFR (Non-African Amer.): 53 — ABNORMAL LOW
Osmolality: 280 (ref 275–301)
SGOT(AST): 40 U/L — ABNORMAL HIGH (ref 15–37)
SGPT (ALT): 34 U/L (ref 12–78)
Total Protein: 9.3 g/dL — ABNORMAL HIGH (ref 6.4–8.2)

## 2012-03-12 LAB — URINALYSIS, COMPLETE
Bilirubin,UR: NEGATIVE
Blood: NEGATIVE
Hyaline Cast: 4
Leukocyte Esterase: NEGATIVE
Ph: 6 (ref 4.5–8.0)
Protein: 30
RBC,UR: 1 /HPF (ref 0–5)
Squamous Epithelial: 3
WBC UR: 1 /HPF (ref 0–5)

## 2012-03-12 LAB — MAGNESIUM: Magnesium: 1.4 mg/dL — ABNORMAL LOW

## 2012-03-12 LAB — PROTIME-INR: INR: 0.9

## 2012-03-12 LAB — CBC
HCT: 48.4 % — ABNORMAL HIGH (ref 35.0–47.0)
HGB: 16 g/dL (ref 12.0–16.0)
MCH: 28.9 pg (ref 26.0–34.0)

## 2012-03-12 LAB — LIPASE, BLOOD: Lipase: 238 U/L (ref 73–393)

## 2012-05-19 IMAGING — CR LEFT LITTLE FINGER 2+V
1 series · 3 of 3 positions shown · non-contrast
Comparison: none

REASON FOR EXAM: injury
COMMENTS:

[Series 1: pa · 0.17mm/px · 3 of 3 slices shown]
[im 1/3]
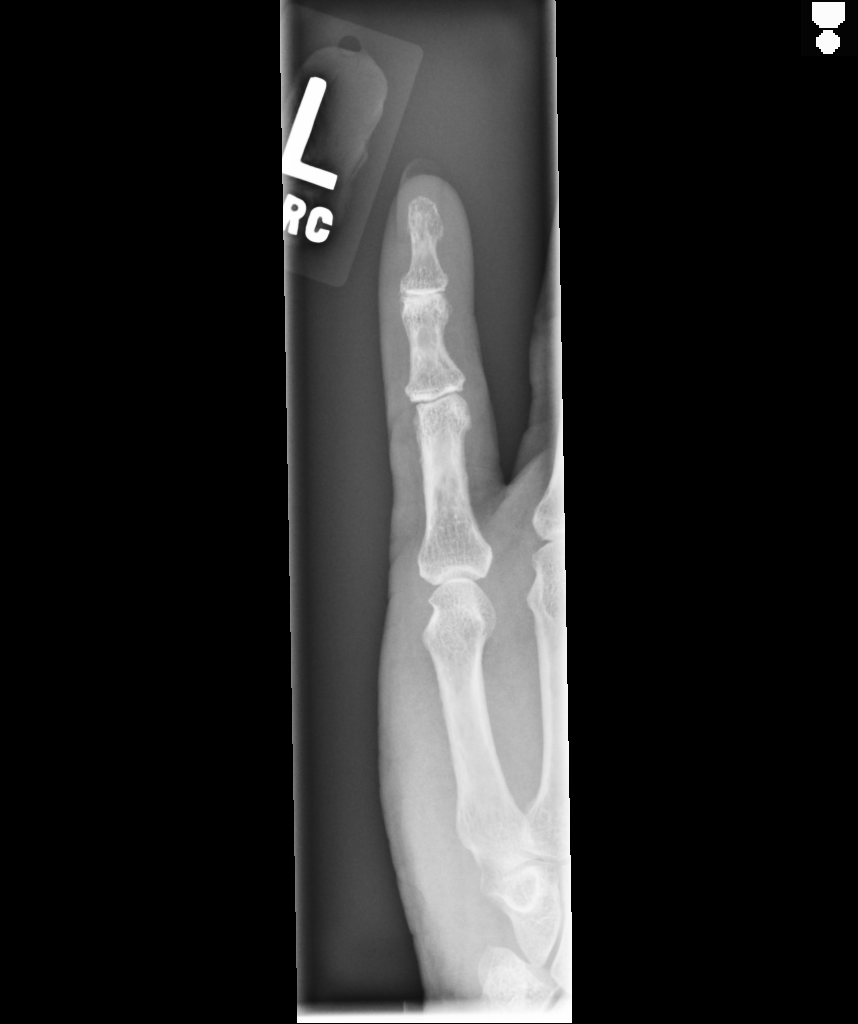
[im 2/3]
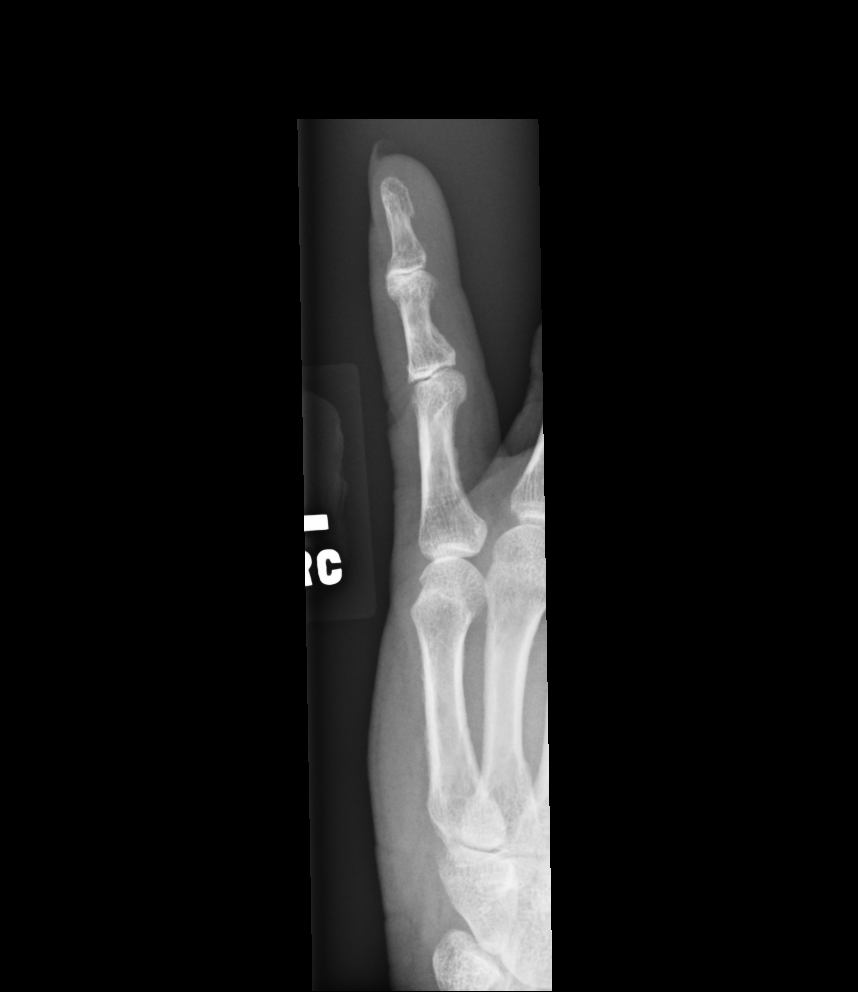
[im 3/3]
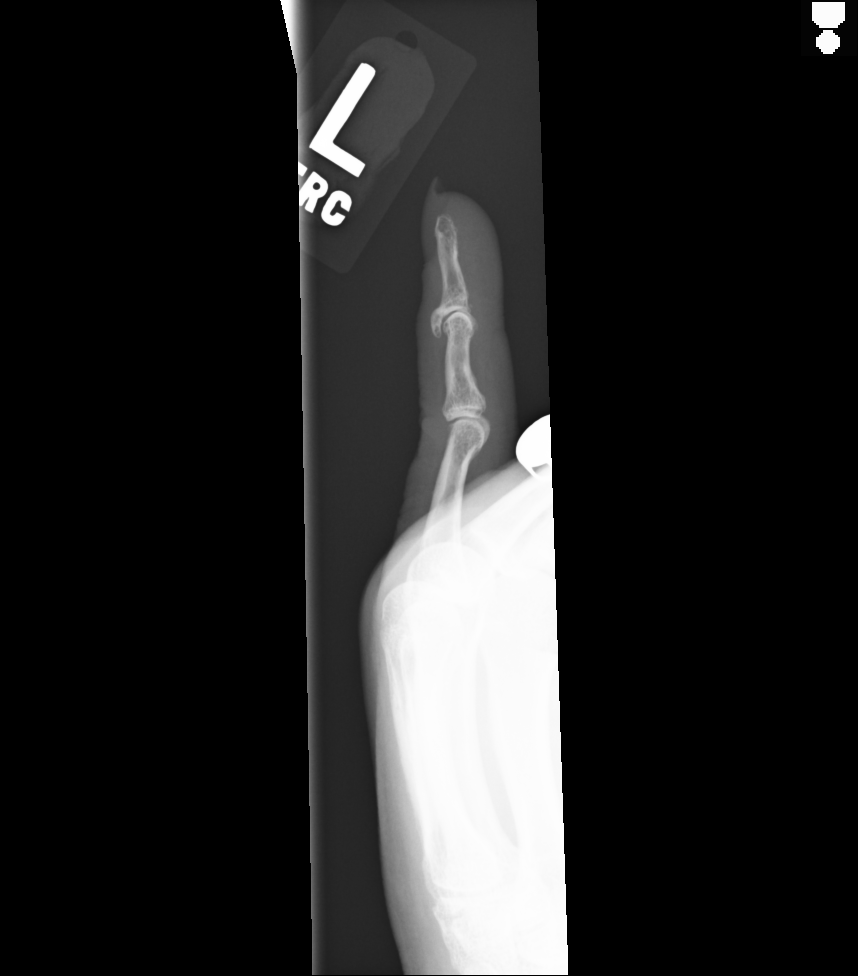

[3 of 3 positions shown; findings below may reference images not displayed]

PROCEDURE:     DXR - DXR FINGER PINKY 5TH DIGIT LT HA  - April 25, 2011 [DATE]

RESULT:     No fracture, dislocation or other acute bony abnormality is
identified. There is narrowing of the PIP and DIP joint spaces consistent
with arthritic change. Hypertrophic spurring is noted dorsally at the DIP
joint. No erosive arthritic changes are seen.
IMPRESSION: 1. No acute bony abnormalities are identified.
2. Arthritic changes are noted as mentioned above.

## 2012-05-22 ENCOUNTER — Ambulatory Visit: Payer: Self-pay | Admitting: Internal Medicine

## 2012-05-28 ENCOUNTER — Ambulatory Visit: Payer: Self-pay | Admitting: Internal Medicine

## 2012-07-02 ENCOUNTER — Ambulatory Visit: Payer: Self-pay | Admitting: Surgery

## 2012-07-03 LAB — PATHOLOGY REPORT

## 2012-07-24 ENCOUNTER — Emergency Department: Payer: Self-pay | Admitting: Unknown Physician Specialty

## 2012-11-07 ENCOUNTER — Emergency Department (HOSPITAL_COMMUNITY)
Admission: EM | Admit: 2012-11-07 | Discharge: 2012-11-07 | Disposition: A | Discharge status: Home / Self Care | Payer: No Typology Code available for payment source | Attending: Emergency Medicine | Admitting: Emergency Medicine

## 2012-11-07 ENCOUNTER — Emergency Department (HOSPITAL_COMMUNITY): Payer: No Typology Code available for payment source

## 2012-11-07 ENCOUNTER — Encounter (HOSPITAL_COMMUNITY): Payer: Self-pay | Admitting: Emergency Medicine

## 2012-11-07 DIAGNOSIS — Y9389 Activity, other specified: Secondary | ICD-10-CM | POA: Insufficient documentation

## 2012-11-07 DIAGNOSIS — F17200 Nicotine dependence, unspecified, uncomplicated: Secondary | ICD-10-CM | POA: Diagnosis not present

## 2012-11-07 DIAGNOSIS — Y92410 Unspecified street and highway as the place of occurrence of the external cause: Secondary | ICD-10-CM | POA: Insufficient documentation

## 2012-11-07 DIAGNOSIS — S0990XA Unspecified injury of head, initial encounter: Secondary | ICD-10-CM | POA: Insufficient documentation

## 2012-11-07 DIAGNOSIS — S335XXA Sprain of ligaments of lumbar spine, initial encounter: Secondary | ICD-10-CM | POA: Insufficient documentation

## 2012-11-07 DIAGNOSIS — T148XXA Other injury of unspecified body region, initial encounter: Secondary | ICD-10-CM

## 2012-11-07 DIAGNOSIS — S139XXA Sprain of joints and ligaments of unspecified parts of neck, initial encounter: Secondary | ICD-10-CM | POA: Diagnosis not present

## 2012-11-07 DIAGNOSIS — E119 Type 2 diabetes mellitus without complications: Secondary | ICD-10-CM | POA: Diagnosis not present

## 2012-11-07 DIAGNOSIS — Z91040 Latex allergy status: Secondary | ICD-10-CM | POA: Insufficient documentation

## 2012-11-07 DIAGNOSIS — Z794 Long term (current) use of insulin: Secondary | ICD-10-CM | POA: Diagnosis not present

## 2012-11-07 HISTORY — DX: Type 2 diabetes mellitus without complications: E11.9

## 2012-11-07 MED ORDER — HYDROCODONE-ACETAMINOPHEN 5-325 MG PO TABS: ORAL_TABLET | ORAL | Status: DC

## 2012-11-07 MED ORDER — METHOCARBAMOL 500 MG PO TABS: 1000.0000 mg | ORAL_TABLET | Freq: Four times a day (QID) | ORAL | Status: DC | PRN

## 2012-11-07 NOTE — Discharge Instructions (Signed)
RESOURCE GUIDE  Chronic Pain Problems: Contact [facility]  [phone] Patients need to be referred by their primary care doctor.  Insufficient Money for Medicine: Contact [employer] Way:  call [phone]  No Primary Care Doctor: - [place] - can help you locate a primary care doctor that  accepts your insurance, provides certain services, etc. - Physician Referral Service- [phone]  Agencies that provide inexpensive medical care: - [facility]  [facility]  [phone] - [facility]  [phone] - [facility]  [phone] - Planned Parenthood  [facility]  [phone]  [place] Providers: - [facility]- [address], [address]  [phone], Mon-Fri 9am-7pm, Sat 9am-1pm - [facility]- [place], Suite [place]  [place], Suite [place]  [place]- [address]  [place], [address], [phone]  Only accepts [place] patients after they have their name  applied to their card  Self Pay (no insurance) in [place]: - Sickle Cell Patients - [facility]  [place], [facility]- [facility] [place]- [address], [facility]- see information above (Speak to [employer] if you do not have insurance)       -  [facility]- [place],  [place], [place]  Dr Vista Lawman-  [address], [place], [place], [place]       -  [facility] and [place] [address] [phone]       -  [facility] [place]- [address], [place], also  [address] [phone]       -     [facility]- [address], [place], 1st & 3rd Saturday         every month, 10am-1pm  -     [place]   [place] [address] [place], [place].   Phone:  [phone], Fax:  [phone]. Hours of Operation:  9 am - 6 pm, M-F.  -     [facility]   [address], [place]   Phone: [phone], Fax: [phone]. Hours of Operation:  8:30 am - 5:30 pm, M-F.  [facility] [place], [place] [address] [phone]  [facility] [place], [place] [address] [phone]  1) Find a Doctor and Pay Out of Pocket Although you won't have to find out who is covered by your insurance plan, it is a good idea to ask around and get recommendations. You will then need to call the office and see if the doctor you have chosen will accept you as a new patient and what types of options they offer for patients who are self-pay. Some doctors offer discounts or will set up payment plans for their patients who do not have insurance, but  you will need to ask so you aren't surprised when you get to your appointment.  2) Contact Your Local Health Department Not all health departments have doctors that can see patients for sick visits, but many do, so it is worth a call to see if yours does. If you don't know where your local health department is, you can check in your phone book. The CDC also has a tool to help you locate your state's health department, and many state websites also have listings of all of their local health departments.  3) Find a [facility] If your illness is not likely to be very severe or complicated, you may want to try a walk in clinic. These are popping up all over the country in pharmacies, drugstores, and shopping centers. They're usually staffed by nurse practitioners or physician assistants that have been trained  to treat common illnesses and complaints. They're usually fairly quick and inexpensive. However, if you have serious medical issues or chronic medical problems, these are probably not your best option  STD Testing - [place], [facility], [address] [place], phone [phone] or [phone].  Monday - Friday, call for an appointment. - [place], [facility] Clinic, [address], [place], phone [phone] or [phone].  Monday - Friday, call for an appointment.  Abuse/Neglect: - [place] [phone] - [place] [phone] (After Hours)  Emergency Shelter:  [facility] [phone]  Maternity Homes: - Room at the [place] [phone] - [place] [phone]  MRSA Hotline #:   867-147-9109  Dental Assistance If unable to pay or uninsured, contact:  [facility]. to become qualified for the adult dental clinic.  Patients with Medicaid: [facility] [phone] [address], [place] [address] [phone]  If unable to pay, or uninsured, contact [facility] [phone] in [place], [place] in [facility]) to become qualified for the adult dental clinic  [facility] [address] [place], [place] [address] [phone] [email or web address]  Other [place]: - [facility] [place], [place], [place], [address], [place], 2nd and 4th Thursday of the month at 6:30am.  10 clients each day by appointment, can sometimes see walk-in patients if someone does not show for an appointment. [facility]- [address] Hillard Danker [place], [place], [address], [place], [place], [place], [address], [place] Department- [place] Department- [place] in the [facility]  Intensive Outpatient Programs: [facility]      [place]. [place], [place] Both a day and evening program       [facility]     [address]        [place], [place] [address] [phone]         ADS: Alcohol & Drug Svcs [place]: [phone] or [phone] [address] [place], [place] [address] [email or web address]   Substance Abuse Resources: - Alcohol and Drug Services  [place] [phone] -  Residential & Outpatient Substance Abuse Program  [phone]  Psychological Services: - [place]  [phone] - [place]  [place], [place] [address], [place], [place]: [phone] or [phone], [email or web address]  Mobile Crisis Teams:                                        Therapeutic Alternatives         Mobile Crisis Care Unit [phone]             [facility] [address] [address] [place] [address] [place] [phone]  Self-Help/Support Groups: [place]. of [employer] of support groups ([phone] (call for more info)  Narcotics Anonymous (NA) Caring Services [address] [place] - 2 meetings at this location  Residential Treatment Programs:  [place]       [place] [address] [place], [place]  [address] [place]  [address] [place], [place] [address] [phone] Admissions: 8am-3pm M-F  Incentives Substance [place]     [address], [place] [address]       [phone]         [facility] [address] Jadene Pierini [place], [place]  [facility] [address] [place], [place]  [facility] Programs - Intensive Outpatient      [address]     [place], [place]         [facility] (Green.)     [place], [place] or [phone]  Residential Treatment Services (RTS), Medicaid [place], [place]  Fellowship [address]                                               [address] [place]  [facility] Resources: [phone]               General Therapy                                                Domenic Schwab, PhD        [address] [place], [place] [address]         [facility]   [address] [place], [place] [address] [phone]  [facility] [address] [place], [place] [address] [phone] Insurance/Medicaid/sponsorship through [facility] and Families                                              [address] [place]                                        [place], [place] [address]    Therapy/tele-psych/case         [place], [place]  [address]  Adolescent/group home/case management [phone]                                           Rosette Reveal PhD       General therapy       Insurance   [phone]         Dr. Adele Schilder, [place], M-F [phone]  [facility] Dept. [address] [place]         [address] [place] Phone:  [phone]                                  Phone:  [phone]                   Phone:  [phone]  [facility], [place] in [place], [address],             [place] [phone] or [phone] (After Hours)   Take the prescriptions as directed.  Apply moist heat or ice to the area(s) of discomfort, for 15 minutes at a time, several times per day for the next few days.  Do not fall asleep on a heating or ice pack.  Call your regular medical doctor today to schedule a follow up appointment this week.  Return to the Emergency Department immediately if worsening.
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
## 2012-12-20 DIAGNOSIS — E1165 Type 2 diabetes mellitus with hyperglycemia: Secondary | ICD-10-CM | POA: Insufficient documentation

## 2012-12-20 DIAGNOSIS — R928 Other abnormal and inconclusive findings on diagnostic imaging of breast: Secondary | ICD-10-CM | POA: Insufficient documentation

## 2012-12-21 DIAGNOSIS — N289 Disorder of kidney and ureter, unspecified: Secondary | ICD-10-CM | POA: Insufficient documentation

## 2013-01-20 ENCOUNTER — Emergency Department: Payer: Self-pay | Admitting: Emergency Medicine

## 2013-01-20 LAB — BASIC METABOLIC PANEL
ANION GAP: 6 — AB (ref 7–16)
BUN: 16 mg/dL (ref 7–18)
CHLORIDE: 104 mmol/L (ref 98–107)
CO2: 23 mmol/L (ref 21–32)
CREATININE: 0.9 mg/dL (ref 0.60–1.30)
Calcium, Total: 9.2 mg/dL (ref 8.5–10.1)
EGFR (Non-African Amer.): >60
GLUCOSE: 190 mg/dL — AB (ref 65–99)
OSMOLALITY: 273 (ref 275–301)
POTASSIUM: 4 mmol/L (ref 3.5–5.1)
Sodium: 133 mmol/L — ABNORMAL LOW (ref 136–145)

## 2013-01-20 LAB — CBC
HCT: 46.5 % (ref 35.0–47.0)
HGB: 15.6 g/dL (ref 12.0–16.0)
MCH: 29.3 pg (ref 26.0–34.0)
MCHC: 33.5 g/dL (ref 32.0–36.0)
MCV: 88 fL (ref 80–100)
PLATELETS: 124 10*3/uL — AB (ref 150–440)
RBC: 5.31 10*6/uL — AB (ref 3.80–5.20)
RDW: 13.5 % (ref 11.5–14.5)
WBC: 10.7 10*3/uL (ref 3.6–11.0)

## 2013-01-20 LAB — TROPONIN I: Troponin-I: <0.02 ng/mL

## 2013-03-28 DIAGNOSIS — M542 Cervicalgia: Secondary | ICD-10-CM | POA: Insufficient documentation

## 2013-03-28 DIAGNOSIS — M5410 Radiculopathy, site unspecified: Secondary | ICD-10-CM | POA: Insufficient documentation

## 2013-04-06 IMAGING — CR DG CHEST 1V PORT
1 series · 1 of 1 positions shown · non-contrast
Comparison: none

REASON FOR EXAM: sob r cp
COMMENTS:

PROCEDURE:     DXR - DXR PORTABLE CHEST SINGLE VIEW  - March 12, 2012 [DATE]
RESULT:     Comparison: 01/31/2011

[ap]
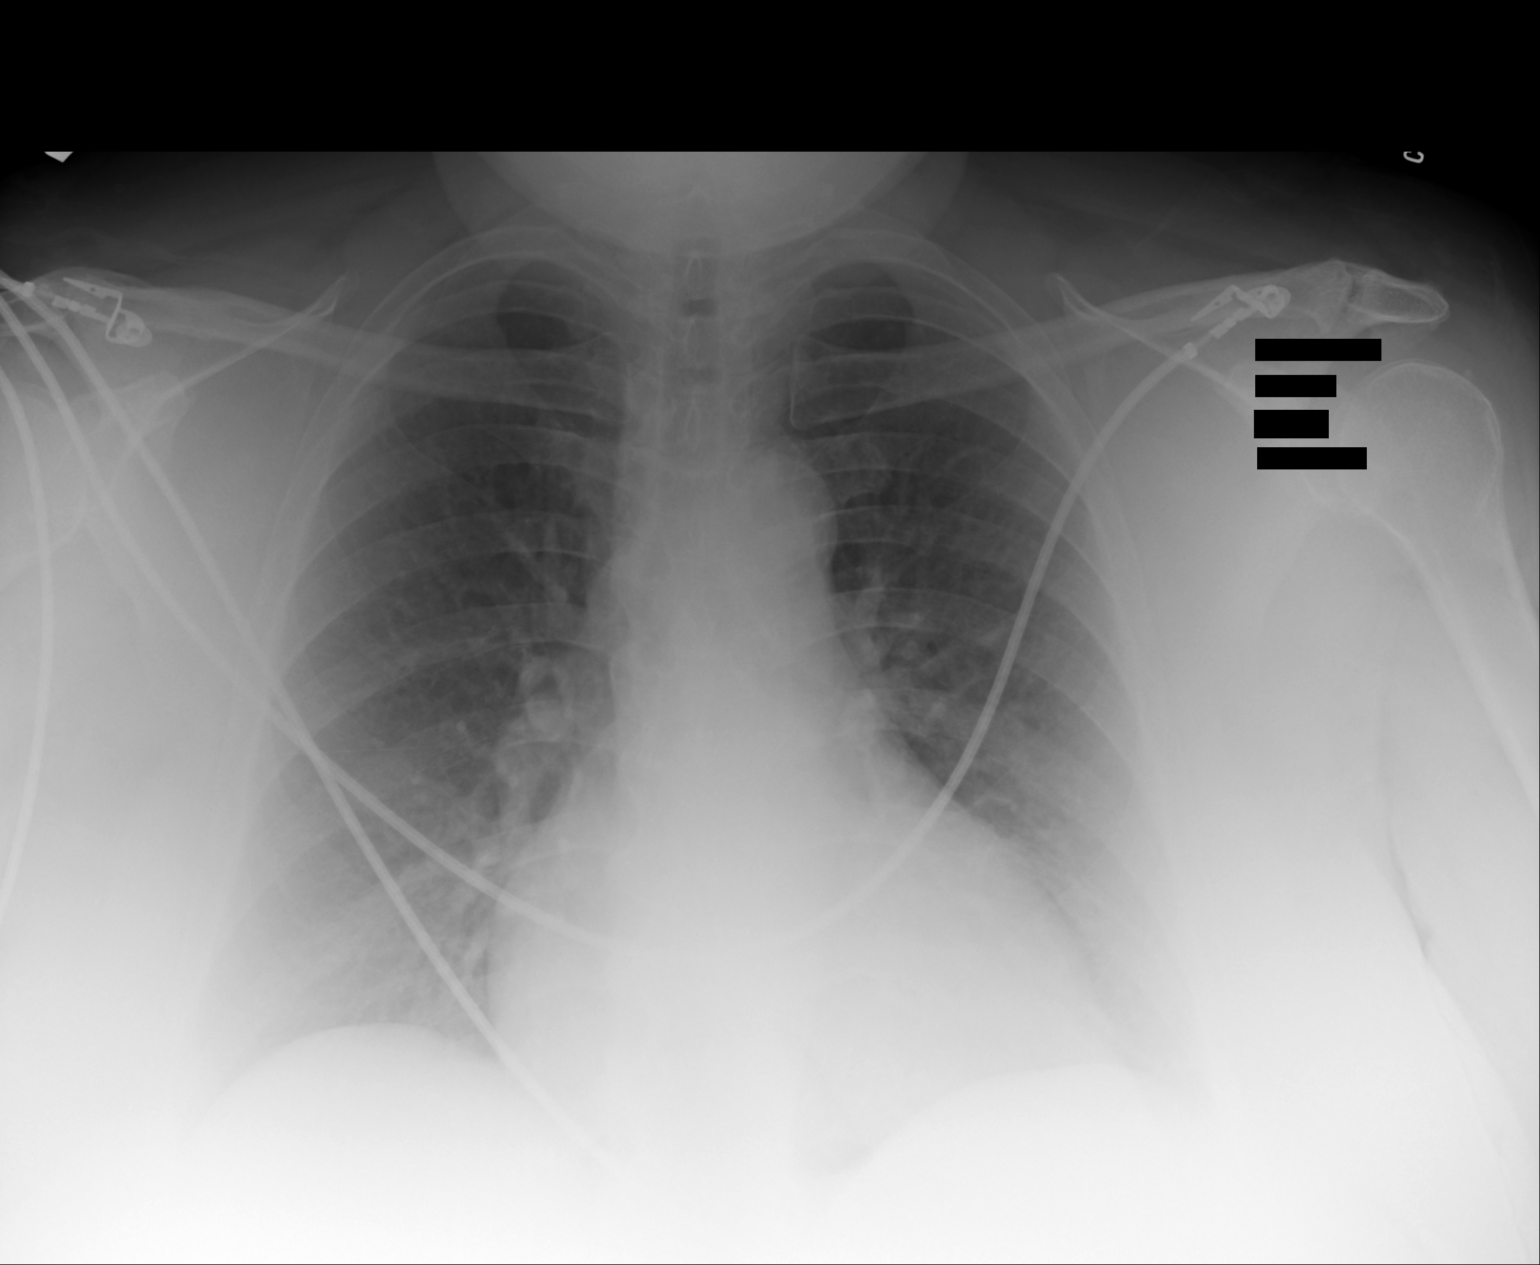

[1 of 1 positions shown; findings below may reference images not displayed]

FINDINGS: The heart and mediastinum are stable. Increased density at the lung bases is
felt to be artifactual secondary to overlying soft tissue. Otherwise, no
focal pulmonary opacities.
IMPRESSION: No acute cardiopulmonary disease.

[REDACTED]

## 2013-04-06 IMAGING — CT CT ABD-PELV W/O CM
1 of 2 series · 15 of 32 positions shown, 19 images · non-contrast
Comparison: none

REASON FOR EXAM: (1) RIGHT FLANK PAIN; (2) RIGHT FLANK PAIN
COMMENTS:

[Series 2: soft tissue · axial · 0.80mm/px · z∈[-804,-402]mm · 15 of 148 slices shown, 19 images]
[im 7/148  soft-tissue]
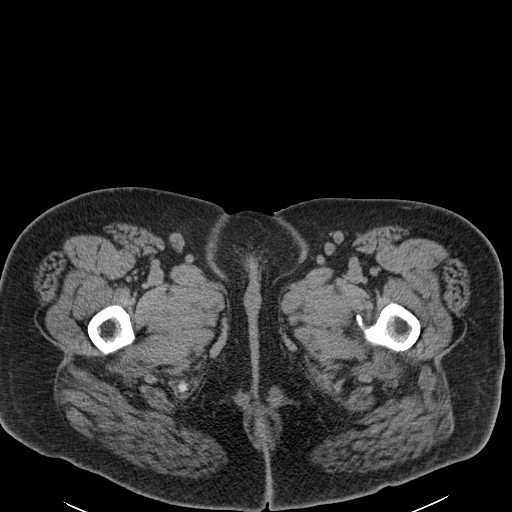
[im 7/148  bone]
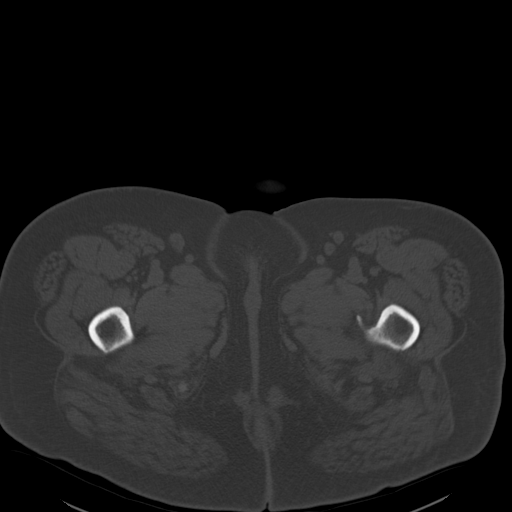
[im 20/148  soft-tissue]
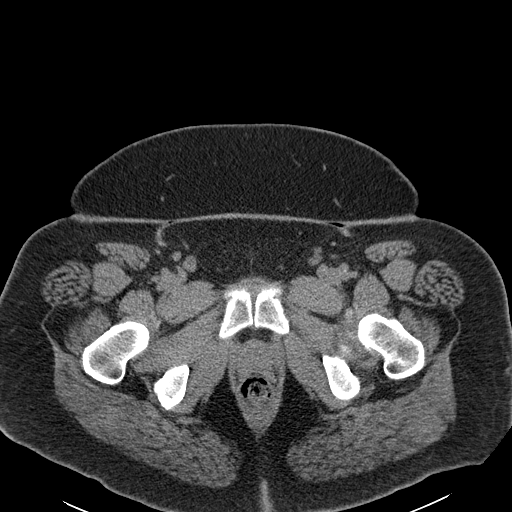
[im 32/148  soft-tissue]
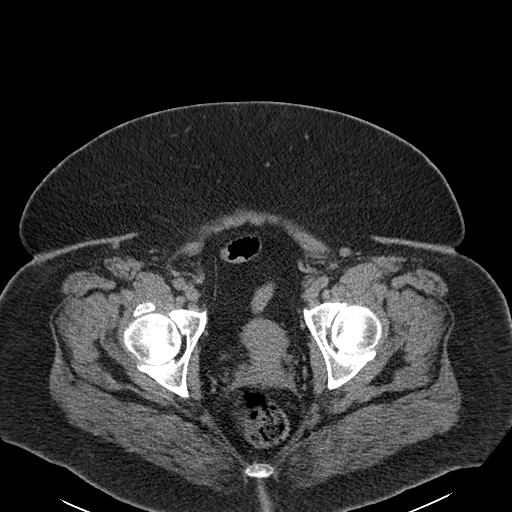
[im 39/148  soft-tissue]
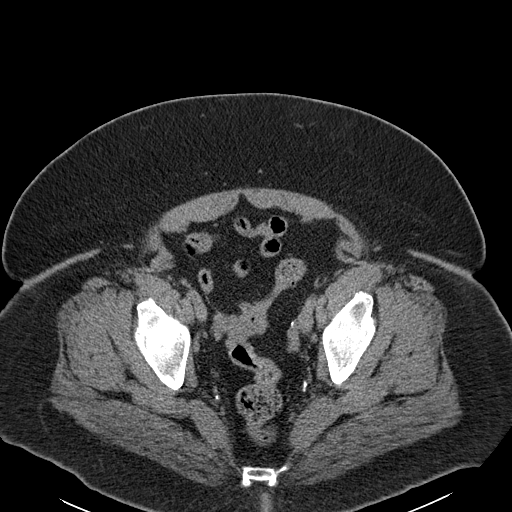
[im 52/148  soft-tissue]
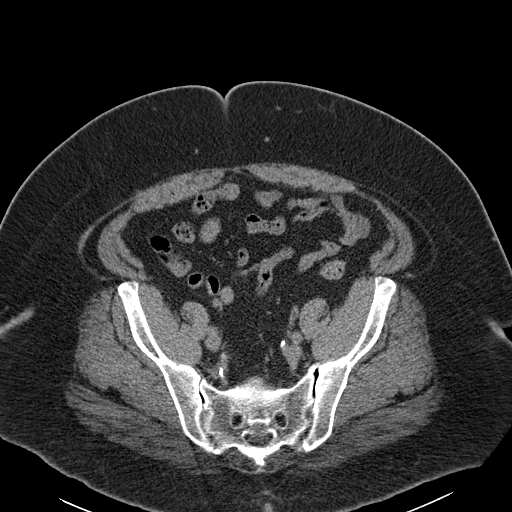
[im 64/148  soft-tissue]
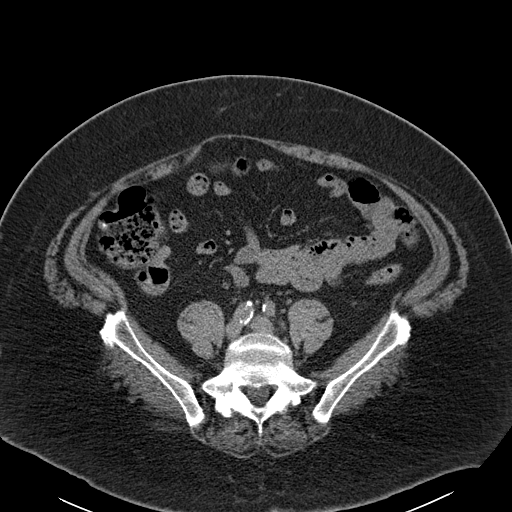
[im 77/148  soft-tissue]
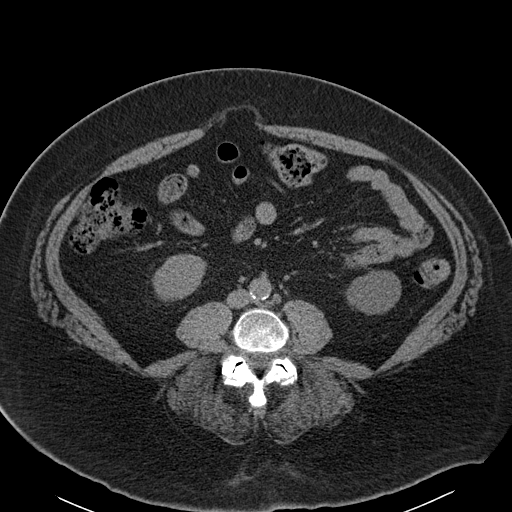
[im 84/148  soft-tissue]
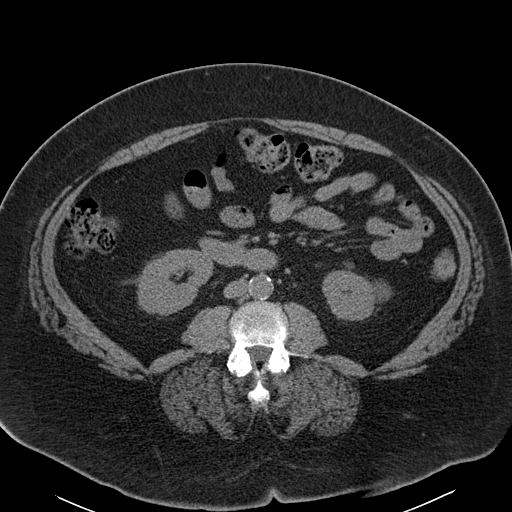
[im 96/148  soft-tissue]
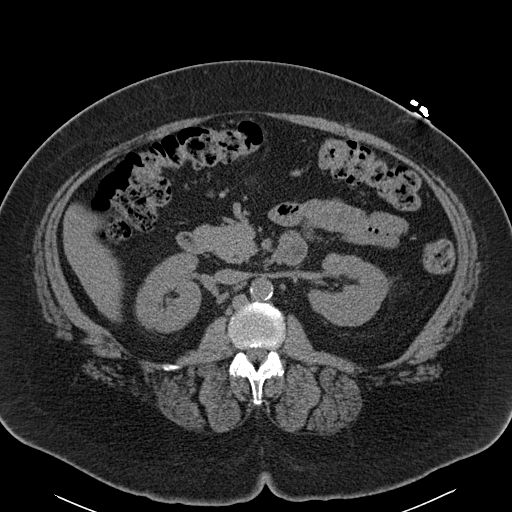
[im 96/148  bone]
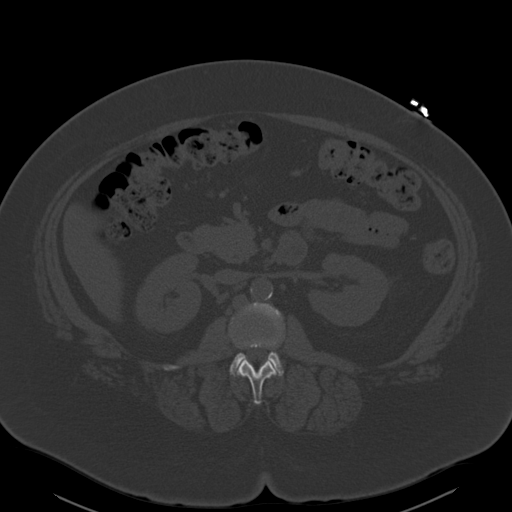
[im 109/148  soft-tissue]
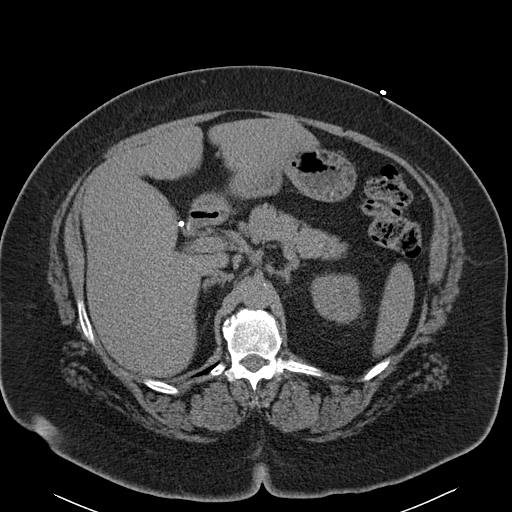
[im 116/148  soft-tissue]
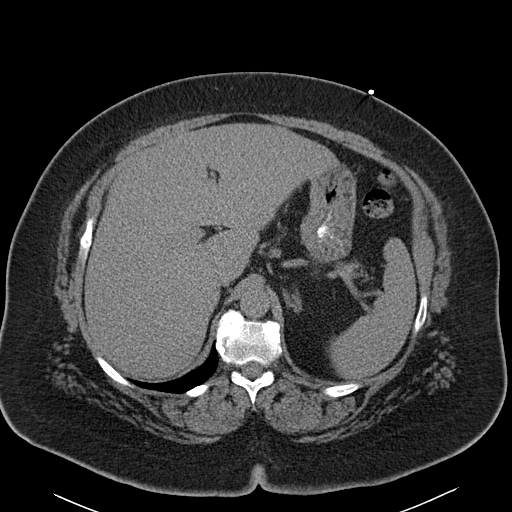
[im 122/148  lung]
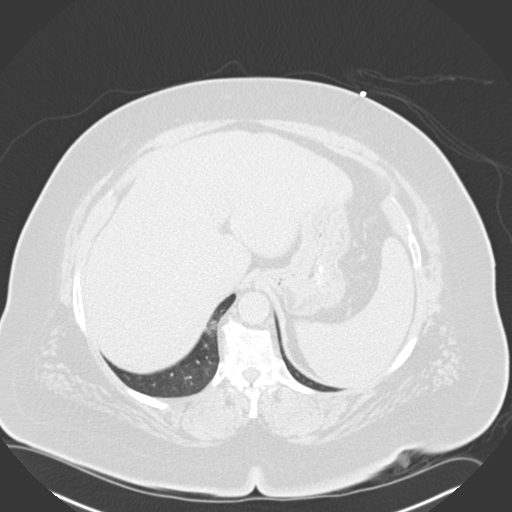
[im 128/148  soft-tissue]
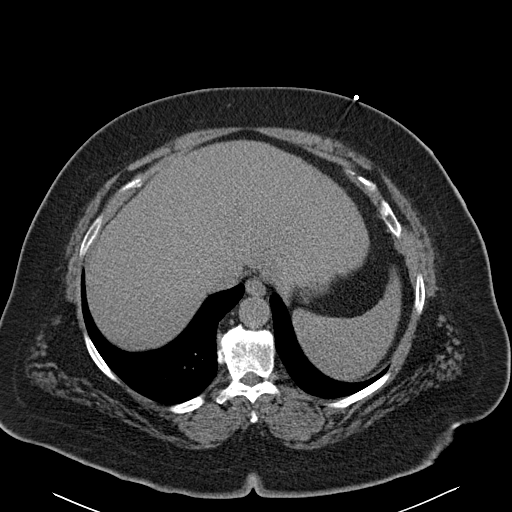
[im 128/148  lung]
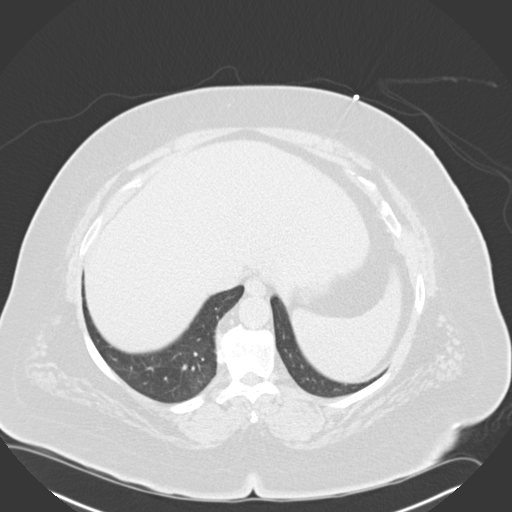
[im 135/148  lung]
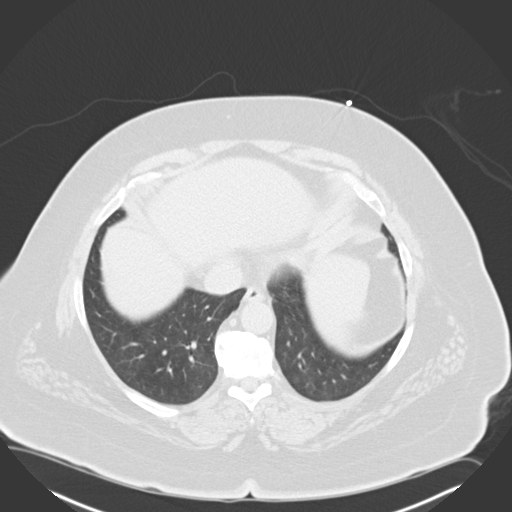
[im 141/148  soft-tissue]
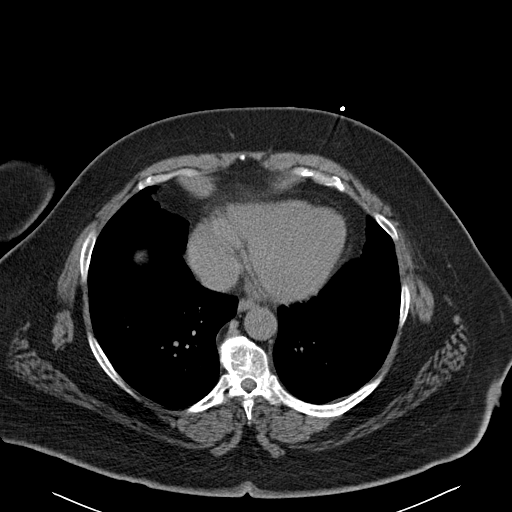
[im 141/148  lung]
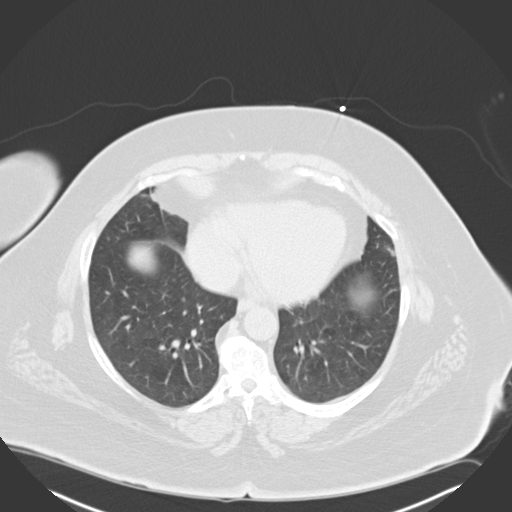

[15 of 32 positions shown; findings below may reference images not displayed]

PROCEDURE:     CT  - CT ABDOMEN AND PELVIS W[DATE] [DATE]

RESULT:     Axial noncontrast CT scanning was performed through the abdomen
and pelvis with reconstructions at 3 mm intervals and slice thicknesses.
Review of multiplanar reconstructed images was performed separately on the
VIA monitor.

The liver exhibits no focal mass nor ductal dilation. The gallbladder is
surgically absent. The pancreas, spleen, nondistended stomach, adrenal
glands, and kidneys are normal in appearance. There is a 1 cm diameter
accessory spleen on image 37. The caliber of the abdominal aorta is normal.

There is a ventral hernia above the umbilicus containing only fat. Its neck
measures 3.4 cm in greatest dimension. A tiny umbilical hernia containing
fat is demonstrated. There is no inguinal hernia. The unopacified loops of
small and large bowel exhibit no evidence of ileus nor of obstruction. There
are no findings to suggest enteritis or colitis or diverticulitis. The
appendix is surgically absent. The terminal ileum does not appear abnormally
thickened. The urinary bladder is decompressed. The uterus is atrophic. The
adnexal structures are within the limits of normal for age. There is no
evidence of ascites. The psoas musculature is normal in density and contour.

The lumbar vertebral bodies are preserved in height. The lung bases exhibit
minimal fibrotic change in the right paravertebral region of the
costophrenic gutter.
IMPRESSION: 1. There is no evidence of bowel obstruction or inflammation.
2. There is no acute hepatobiliary abnormality. The gallbladder is
surgically absent.
3. There is no evidence of urinary tract stones or obstruction. There is an
exophytic hypodensity from the lower pole of the left kidney with Hounsfield
measurement of +4 most compatible with a 3 cm diameter cyst.
4. There is a ventral hernia above the level of the umbilicus which contains
only peritoneal fat at this time.

[REDACTED]

## 2013-06-22 IMAGING — US US BREAST BILAT
1 series · 14 of 25 positions shown · non-contrast
Comparison: none

REASON FOR EXAM: av bil asymmetric density
COMMENTS:

PROCEDURE:     US  - US BREAST BILATERAL  - May 28, 2012  [DATE]
RESULT:

[Series 1: us breast bilat · 0.08mm/px · 14 of 45 slices shown]
[im 1/45]
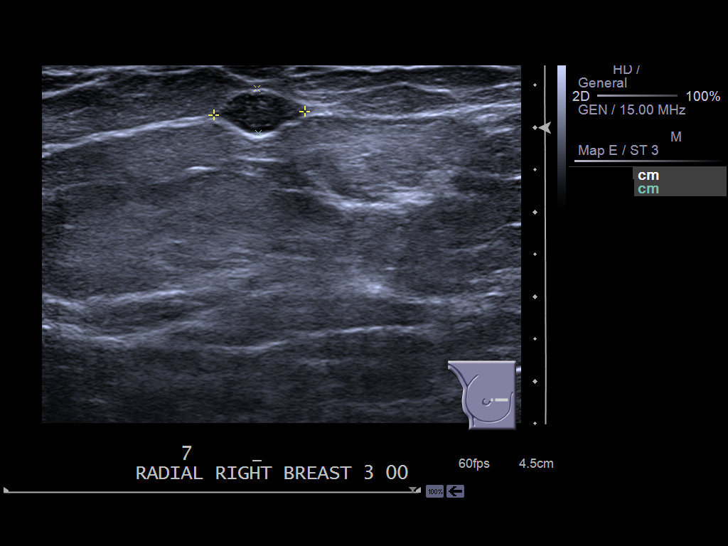
[im 4/45]
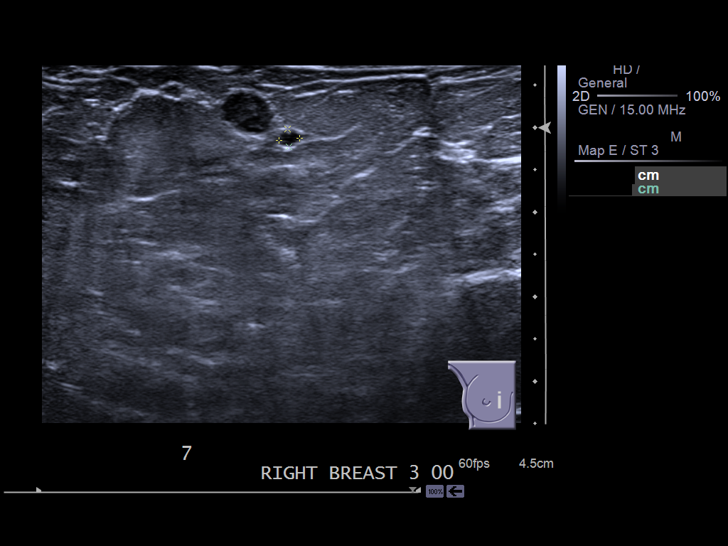
[im 8/45]
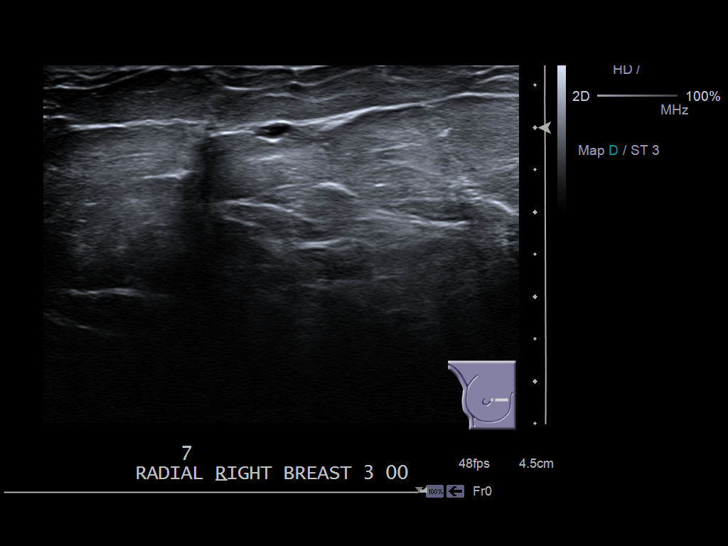
[im 12/45]
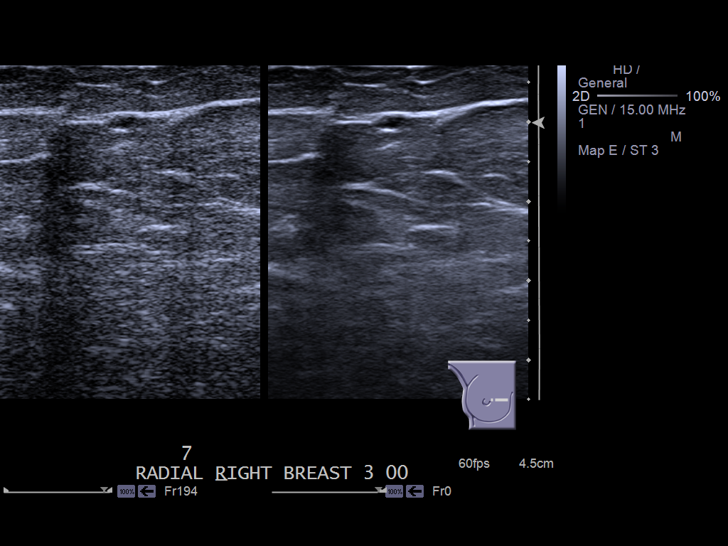
[im 15/45]
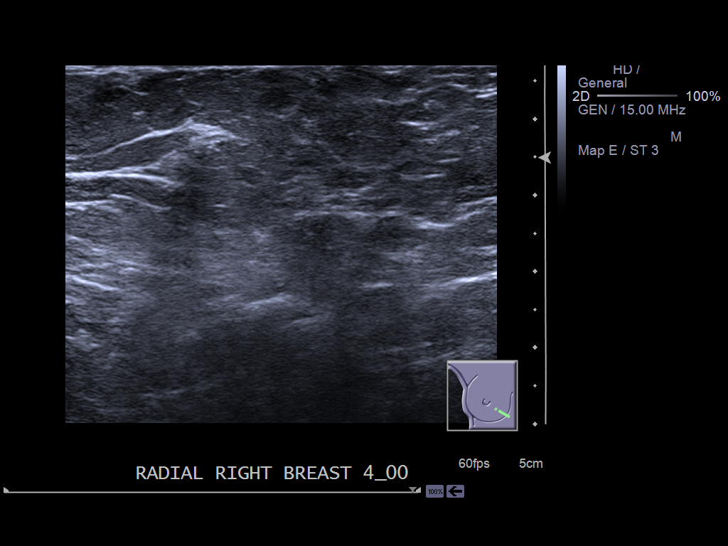
[im 17/45]
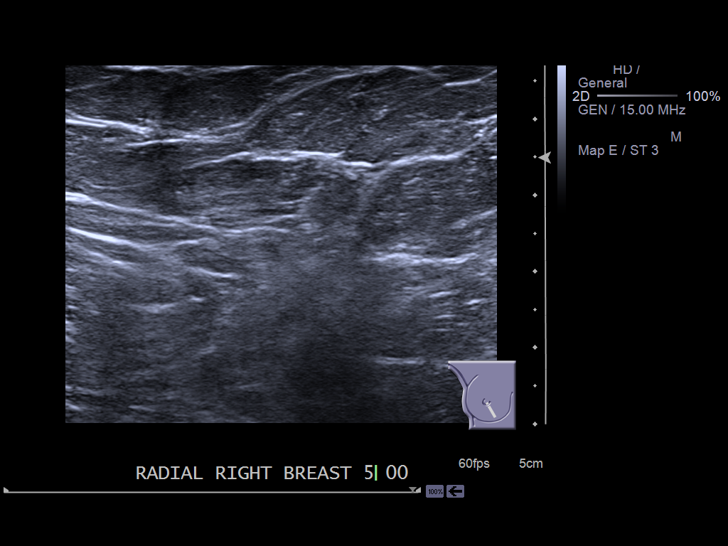
[im 21/45]
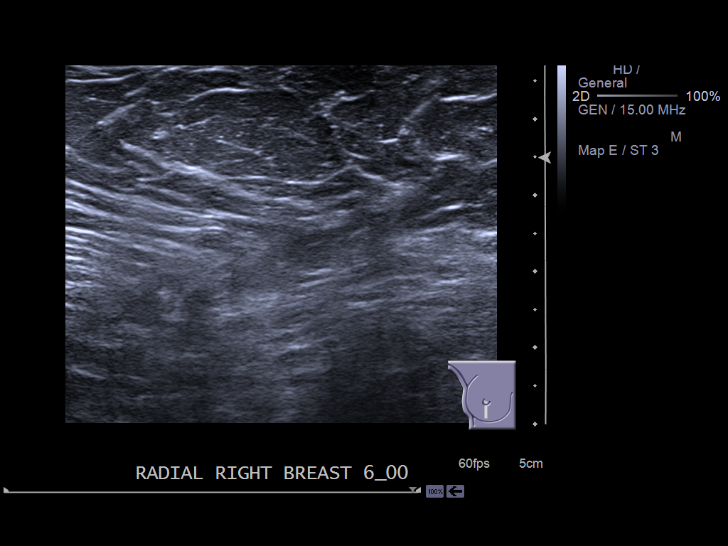
[im 24/45]
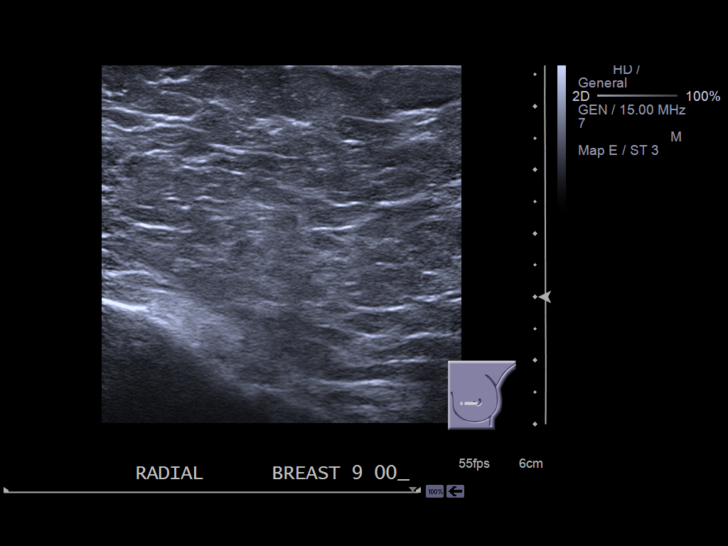
[im 28/45]
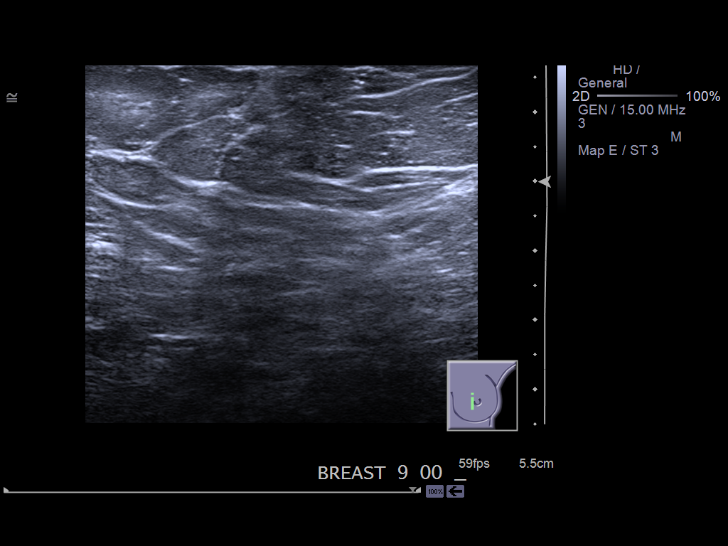
[im 30/45]
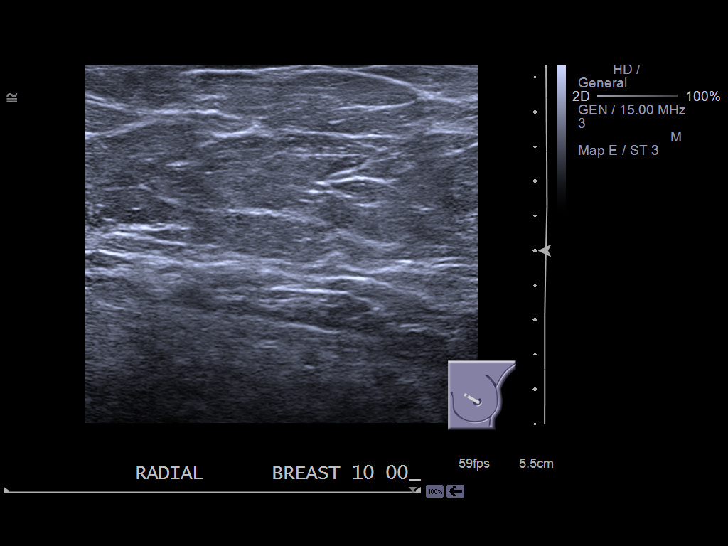
[im 34/45]
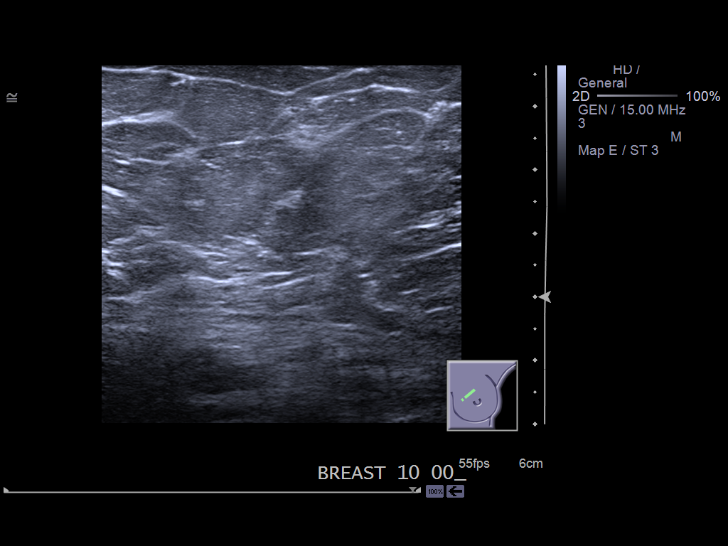
[im 37/45]
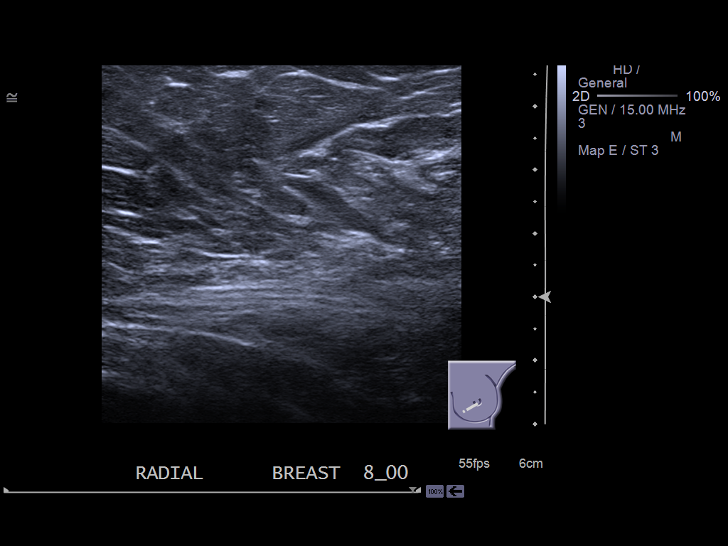
[im 41/45]
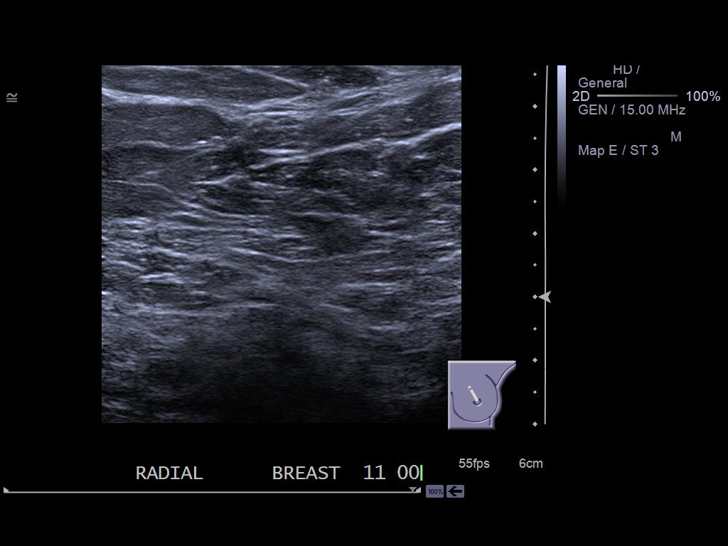
[im 45/45]
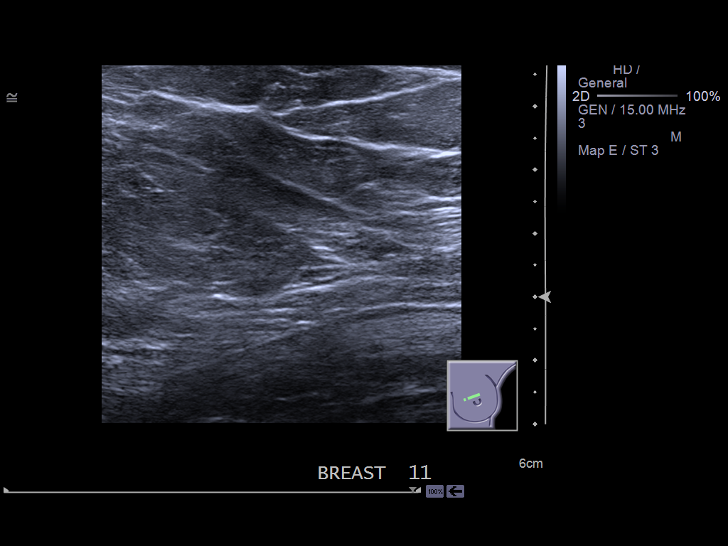

[14 of 25 positions shown; findings below may reference images not displayed]

FINDINGS: Evaluation of the region of interest within the right breast at
the [DATE] position approximately 7 cm from the nipple demonstrates a
hypoechoic nodule measuring 1.08 x 0.53 x 0.71 cm. A small satellite nodule
adjacent to this area is identified measuring 0.24 x 0.26 x 0.41 cm. The
dominant nodule is smoothly marginated though demonstrates internal echoes.
The nodule has an oval-shaped and there does not appear to be appreciable
vascularity.

The region in the left breast demonstrates no solid or cystic sonographic
abnormalities.
IMPRESSION: 1. Indeterminate hypoechoic nodule within the right breast with a small
adjacent satellite nodule. Please refer to the bilateral additional
mammographic dictation for complete discussion.
2. No solid or cystic sonographic abnormalities within the left breast.

## 2013-06-26 DIAGNOSIS — M79601 Pain in right arm: Secondary | ICD-10-CM | POA: Insufficient documentation

## 2013-07-19 DIAGNOSIS — I2510 Atherosclerotic heart disease of native coronary artery without angina pectoris: Secondary | ICD-10-CM | POA: Insufficient documentation

## 2013-07-27 IMAGING — CR US BIOPSY BREAST CORE VACUUM ASSIST
6 of 8 series · 6 of 8 positions shown · non-contrast
Comparison: 05/28/2012.

REASON FOR EXAM: LT BR DENSITY
COMMENTS:

PROCEDURE:     MAM - MAM STEREOTACTIC VACUUM ASSIST L  - July 02, 2012  [DATE]
RESULT:

[CC (1 of 6)]
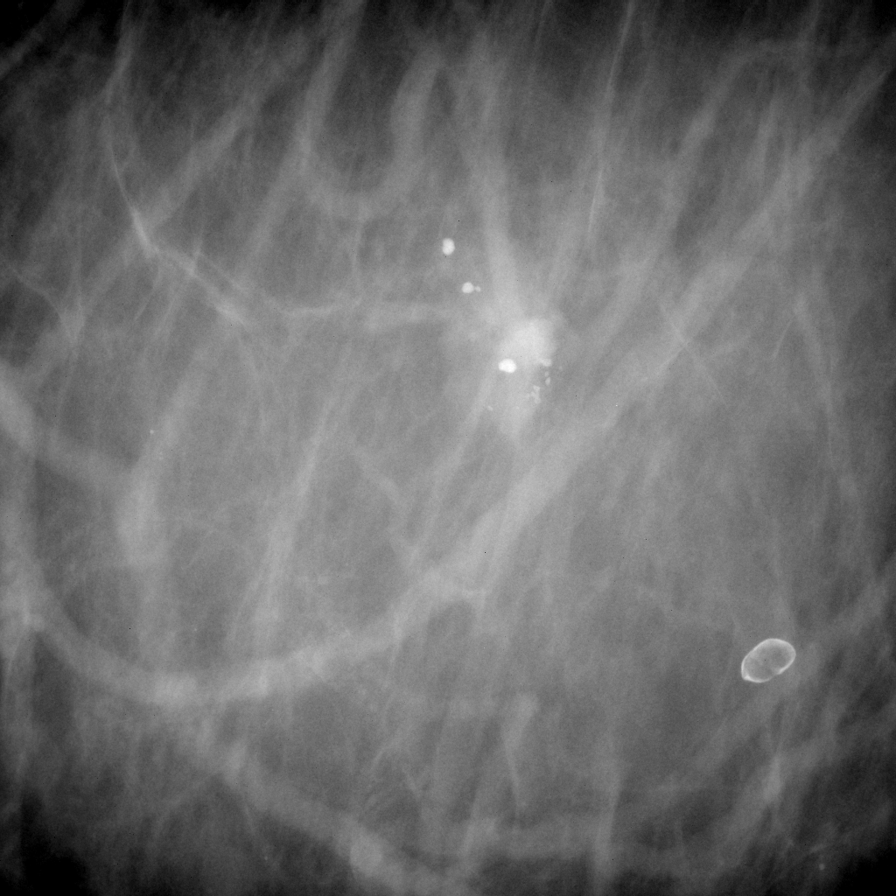

[CC (2 of 6)]
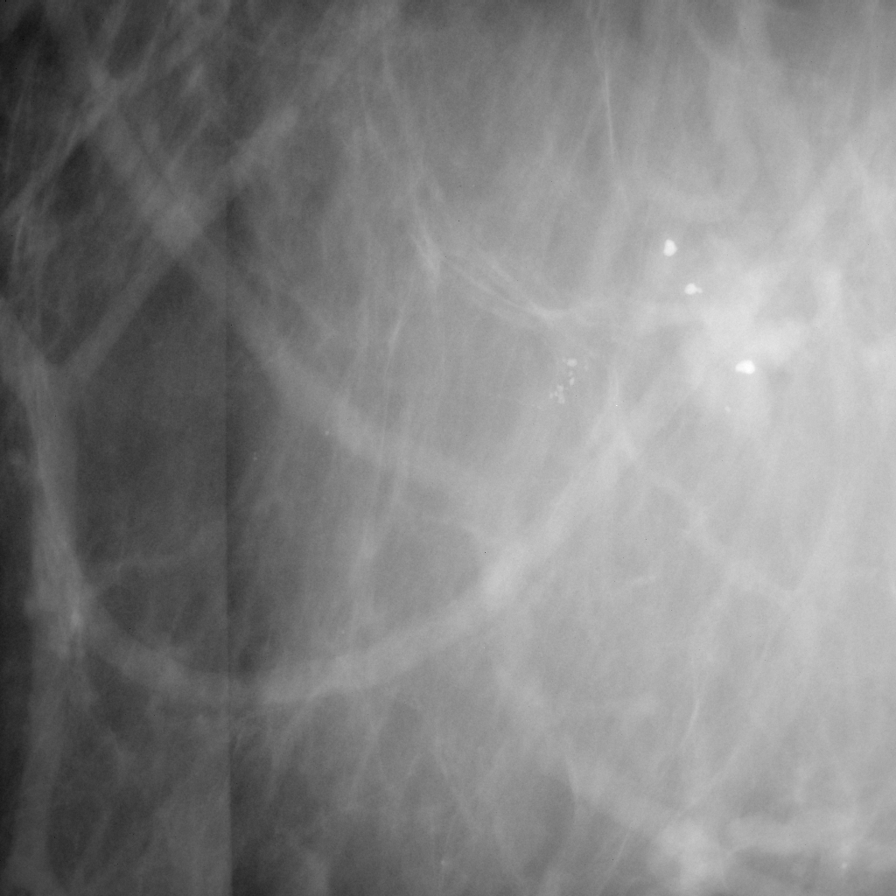

[CC (3 of 6)]
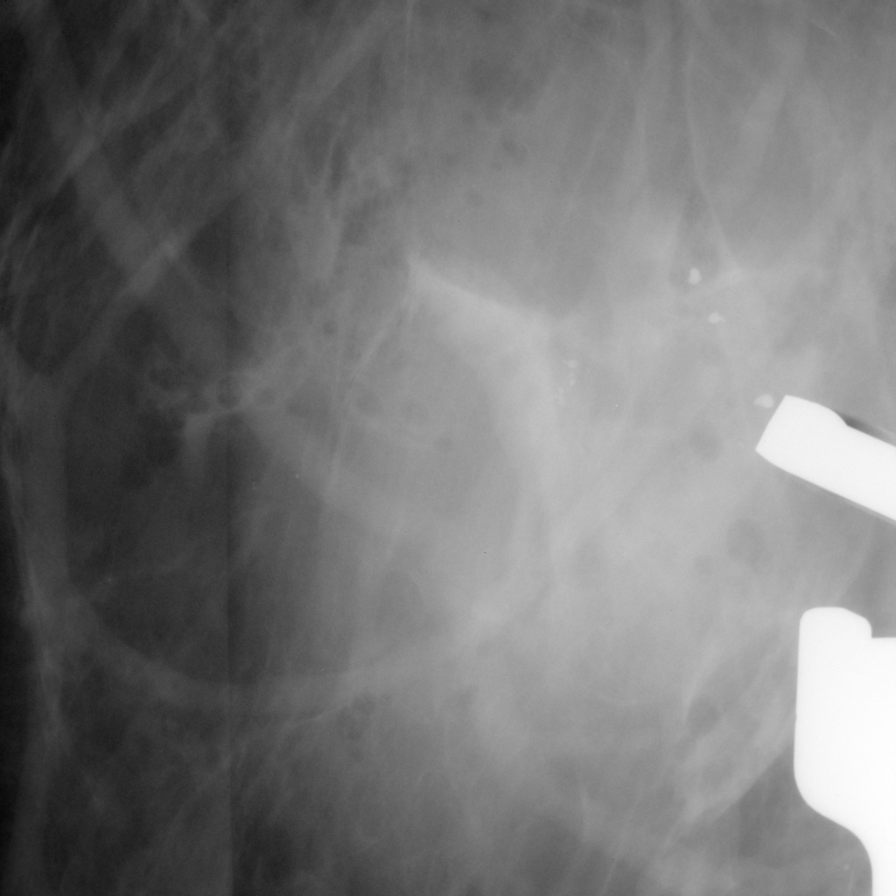

[CC (4 of 6)]
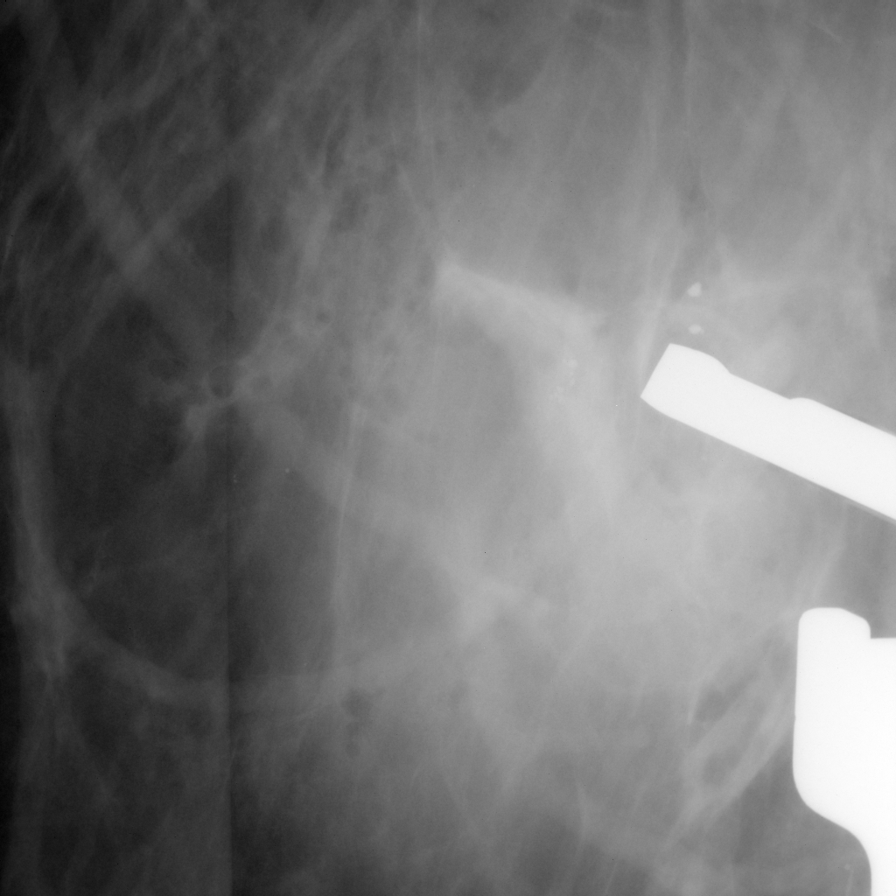

[CC (5 of 6)]
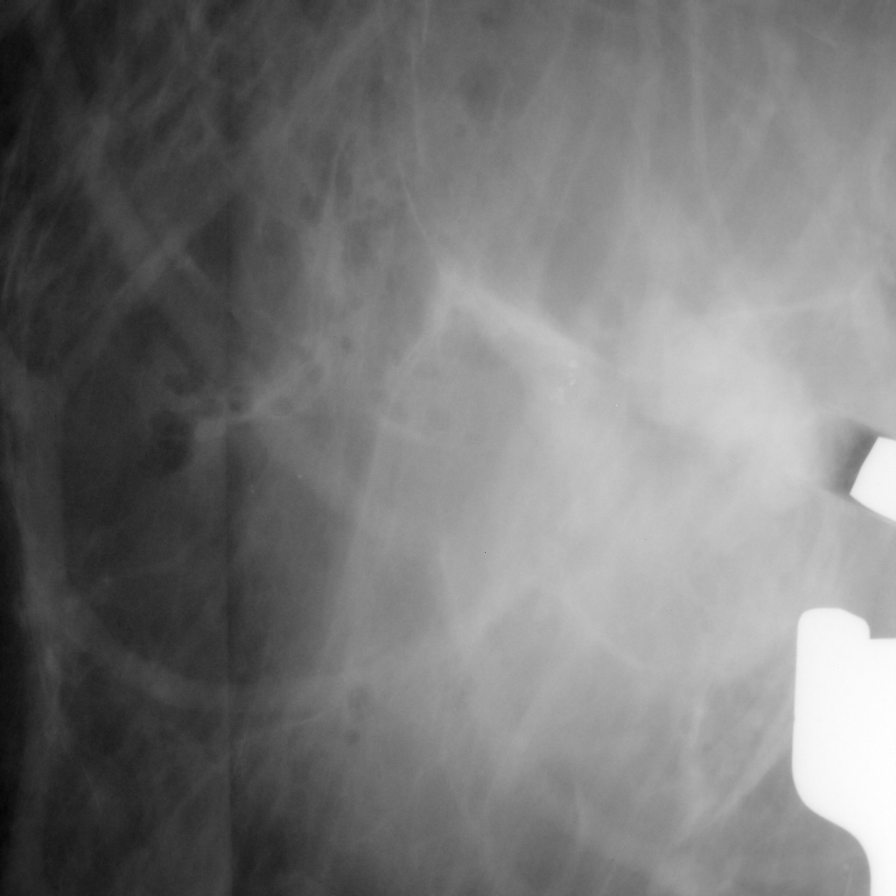

[CC (6 of 6)]
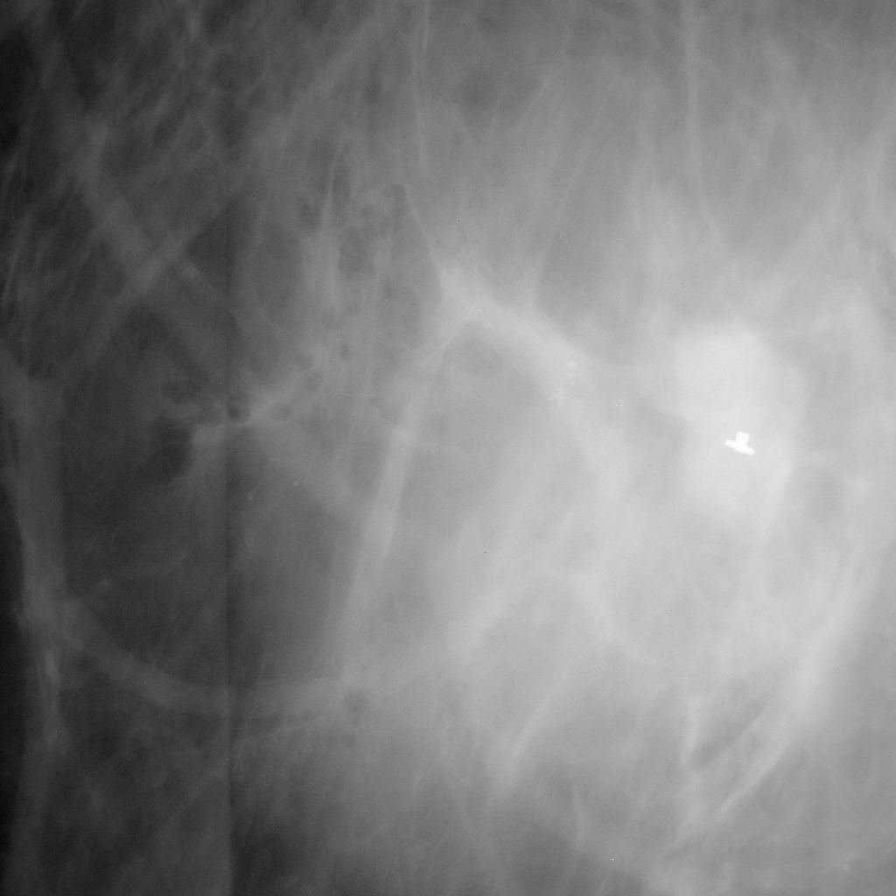

[6 of 8 positions shown; findings below may reference images not displayed]

Procedure:

The pre-procedural mammograms were reviewed. After all risks and benefits
were explained to the patient and all questions were answered, informed
consent was obtained. The patient's left breast was placed in true lateral
compression. The small cluster of microcalcifications and the small,
mass-like density in the superior left breast was localized with
Stereotactic mammograms. A formal time-out procedure was performed. The
patient's skin was cleansed in the usual sterile fashion. Local anesthesia
was provided with approximately 5 ml of 1% Lidocaine. A dermotomy was made
with an 11 blade scalpel needle. The biopsy device was advanced to the site
of calcifications and density. Then, eight core biopsy samples were obtained
with a 9 gauge vacuum assisted Core Biopsy device. Subsequent Stereotactic
mammograms demonstrated the calcifications were no longer seen and there
were post biopsy changes in the region of the previously seen density. A
biopsy clip marker was placed at the site of biopsy. The patient's left
breast was taken out of compression and placed in manual compression. The
patient tolerated the procedure well. Hemostasis was achieved.

Post biopsy CC and true lateral mammograms demonstrate the biopsy clip
marker in the superior medial left breast at the site of previously
demonstrated calcifications and density. Density in this region on today's
mammograms is likely related to post biopsy changes. This would obscure any
residual density from the pre-biopsy mammograms.
IMPRESSION: Stereotactic Guided Biopsy of the calcifications and small mass-like density
in the superior medial left breast. If the pathologic results are benign,
follow-up mammograms would recommended in six months.

## 2013-08-03 ENCOUNTER — Emergency Department: Payer: Self-pay | Admitting: Emergency Medicine

## 2013-08-03 LAB — URINALYSIS, COMPLETE
BILIRUBIN, UR: NEGATIVE
Bacteria: NONE SEEN
Blood: NEGATIVE
GLUCOSE, UR: NEGATIVE mg/dL (ref 0–75)
Ketone: NEGATIVE
Leukocyte Esterase: NEGATIVE
Nitrite: NEGATIVE
Ph: 5 (ref 4.5–8.0)
Protein: 100
RBC,UR: <1 /HPF (ref 0–5)
Specific Gravity: 1.028 (ref 1.003–1.030)
Squamous Epithelial: 1

## 2013-08-03 LAB — CBC WITH DIFFERENTIAL/PLATELET
BASOS PCT: 0.7 %
Basophil #: 0.1 10*3/uL (ref 0.0–0.1)
Eosinophil #: 0.3 10*3/uL (ref 0.0–0.7)
Eosinophil %: 3 %
HCT: 45.7 % (ref 35.0–47.0)
HGB: 15 g/dL (ref 12.0–16.0)
LYMPHS ABS: 3.4 10*3/uL (ref 1.0–3.6)
LYMPHS PCT: 30.9 %
MCH: 29.4 pg (ref 26.0–34.0)
MCHC: 32.9 g/dL (ref 32.0–36.0)
MCV: 89 fL (ref 80–100)
MONO ABS: 0.7 x10 3/mm (ref 0.2–0.9)
MONOS PCT: 6.7 %
Neutrophil #: 6.5 10*3/uL (ref 1.4–6.5)
Neutrophil %: 58.7 %
PLATELETS: 118 10*3/uL — AB (ref 150–440)
RBC: 5.12 10*6/uL (ref 3.80–5.20)
RDW: 13.6 % (ref 11.5–14.5)
WBC: 11.1 10*3/uL — ABNORMAL HIGH (ref 3.6–11.0)

## 2013-08-03 LAB — BASIC METABOLIC PANEL
Anion Gap: 8 (ref 7–16)
BUN: 16 mg/dL (ref 7–18)
CREATININE: 1.19 mg/dL (ref 0.60–1.30)
Calcium, Total: 9.2 mg/dL (ref 8.5–10.1)
Chloride: 104 mmol/L (ref 98–107)
Co2: 27 mmol/L (ref 21–32)
EGFR (Non-African Amer.): 50 — ABNORMAL LOW
GFR CALC AF AMER: 58 — AB
Glucose: 153 mg/dL — ABNORMAL HIGH (ref 65–99)
Osmolality: 282 (ref 275–301)
Potassium: 3.4 mmol/L — ABNORMAL LOW (ref 3.5–5.1)
Sodium: 139 mmol/L (ref 136–145)

## 2013-08-03 LAB — TROPONIN I
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
## 2014-02-14 IMAGING — CR DG CHEST 2V
1 series · 2 of 2 positions shown · non-contrast
Comparison: 03/12/2012 and 01/31/2011.

CLINICAL DATA: Shortness of breath with increased blood pressure
and ear pain. History of congestive heart failure.

EXAM:
CHEST  2 VIEW

[Series 1: w chest pa · 0.14mm/px · 2 of 2 slices shown]
[im 1/2]
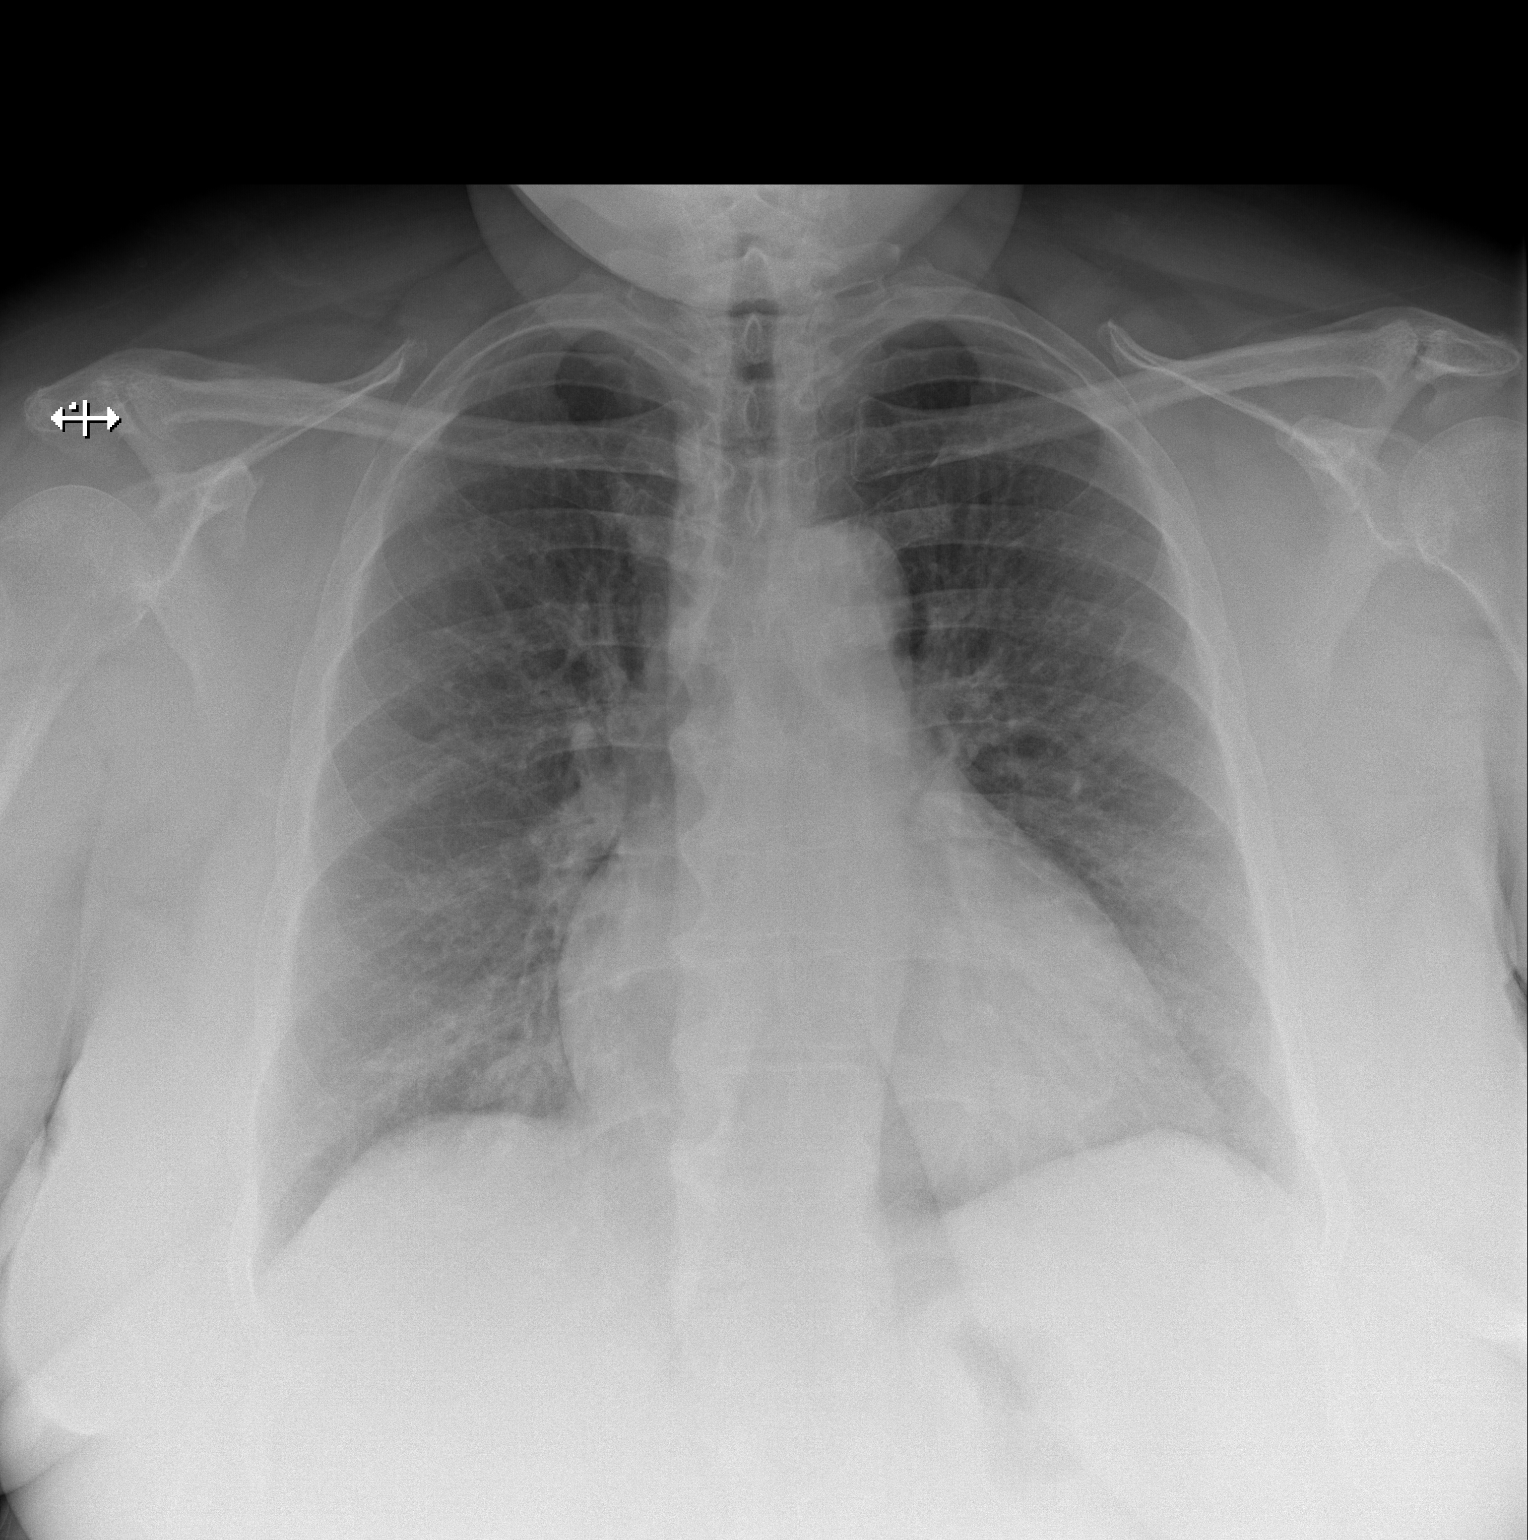
[im 2/2]
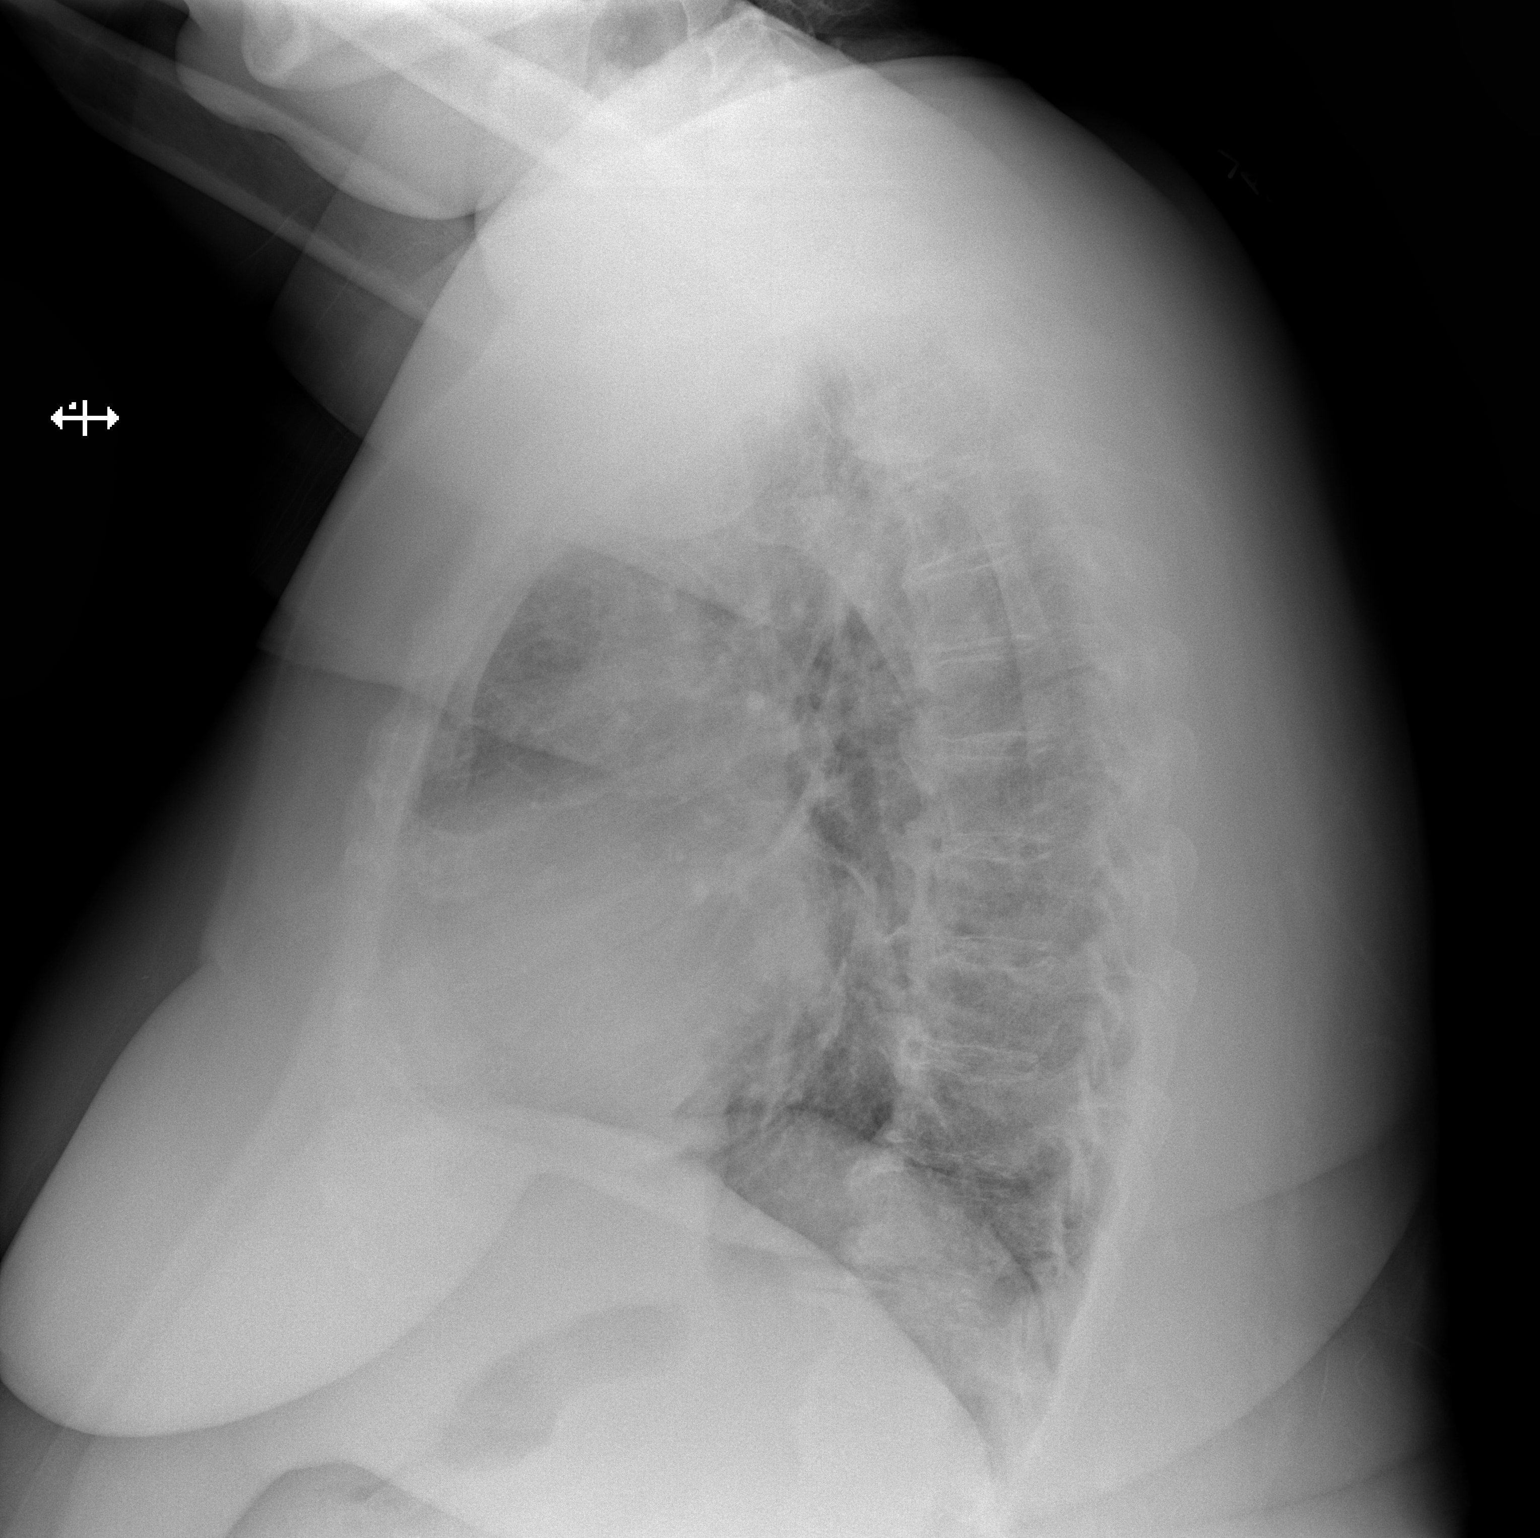

[2 of 2 positions shown; findings below may reference images not displayed]

FINDINGS: The heart is mildly enlarged, but stable. The mediastinal contours
are stable. There is vascular congestion without overt pulmonary
edema or confluent airspace opacity. There is no pleural effusion or
pneumothorax. The osseous structures appear unchanged with
degenerative changes throughout the thoracic spine.
IMPRESSION: Stable cardiomegaly and chronic vascular congestion. No acute
cardiopulmonary process demonstrated.

## 2014-04-01 ENCOUNTER — Emergency Department: Payer: Self-pay | Admitting: Emergency Medicine

## 2014-05-12 ENCOUNTER — Emergency Department
Admit: 2014-05-12 | Disposition: A | Discharge status: Home / Self Care | Payer: Self-pay | Admitting: Emergency Medicine

## 2014-05-21 DIAGNOSIS — B182 Chronic viral hepatitis C: Secondary | ICD-10-CM | POA: Insufficient documentation

## 2015-05-08 ENCOUNTER — Emergency Department
Admission: EM | Admit: 2015-05-08 | Discharge: 2015-05-08 | Disposition: A | Discharge status: Home / Self Care | Payer: Medicaid Other | Attending: Emergency Medicine | Admitting: Emergency Medicine

## 2015-05-08 DIAGNOSIS — Z794 Long term (current) use of insulin: Secondary | ICD-10-CM | POA: Diagnosis not present

## 2015-05-08 DIAGNOSIS — M25531 Pain in right wrist: Secondary | ICD-10-CM | POA: Insufficient documentation

## 2015-05-08 DIAGNOSIS — E119 Type 2 diabetes mellitus without complications: Secondary | ICD-10-CM | POA: Diagnosis not present

## 2015-05-08 DIAGNOSIS — F17210 Nicotine dependence, cigarettes, uncomplicated: Secondary | ICD-10-CM | POA: Insufficient documentation

## 2015-05-08 DIAGNOSIS — M25532 Pain in left wrist: Secondary | ICD-10-CM | POA: Insufficient documentation

## 2015-05-08 DIAGNOSIS — Z791 Long term (current) use of non-steroidal anti-inflammatories (NSAID): Secondary | ICD-10-CM | POA: Insufficient documentation

## 2015-05-08 MED ORDER — NAPROXEN 500 MG PO TABS: 500.0000 mg | ORAL_TABLET | Freq: Two times a day (BID) | ORAL | Status: DC

## 2015-05-08 NOTE — ED Provider Notes (Signed)
[facility] Emergency Department Provider Note  ____________________________________________  Time seen: On arrival  I have reviewed the triage vital signs and the nursing notes.   HISTORY  Chief Complaint No chief complaint on file.    HPI [name] is a 60 y.o. female who presents with complaints of bilateral wrist pain for approximately one month. Patient reports she is not sure what is causing this discomfort she reports she has had carpal tunnel surgery on her left hand but not on her right. She does feel some tingling in her fingers on the right hand. No weakness. No redness. No swelling. No fevers no chills.    Past Medical History  Diagnosis Date  . Diabetes mellitus without complication     There are no active problems to display for this patient.   Past Surgical History  Procedure Laterality Date  . Appendectomy    . Cholecystectomy      Current Outpatient Rx  Name  Route  Sig  Dispense  Refill  . HYDROcodone-acetaminophen (NORCO/VICODIN) 5-325 MG per tablet      1 tab PO q6 hours prn pain   15 tablet   0   . insulin regular human CONCENTRATED (HUMULIN R) 500 UNIT/ML SOLN injection   Subcutaneous   Inject 5-20 Units into the skin 3 (three) times daily with meals. Pt on sliding scale         . methocarbamol (ROBAXIN) 500 MG tablet   Oral   Take 2 tablets (1,000 mg total) by mouth 4 (four) times daily as needed (muscle spasm/pain).   25 tablet   0   . naproxen (NAPROSYN) 500 MG tablet   Oral   Take 1 tablet (500 mg total) by mouth 2 (two) times daily with a meal.   20 tablet   2     Allergies Latex  No family history on file.  Social History Social History  Substance Use Topics  . Smoking status: Current Every Day Smoker    Types: Cigarettes  . Smokeless tobacco: Not on file  . Alcohol Use: No    Review of Systems  Constitutional: Negative for fever.     Musculoskeletal: Wrist pain as  above Skin: Negative for rash. Neurological: Negative for focal weakness   ____________________________________________   PHYSICAL EXAM:  VITAL SIGNS: ED Triage Vitals  Enc Vitals Group     BP 05/08/15 0601 188/84 mmHg     Pulse Rate 05/08/15 0601 99     Resp 05/08/15 0601 20     Temp 05/08/15 0601 98.3 F (36.8 C)     Temp Source 05/08/15 0601 Oral     SpO2 05/08/15 0601 98 %     Weight 05/08/15 0601 245 lb (111.131 kg)     Height 05/08/15 0601 5' 5"  (1.651 m)     Head Cir --      Peak Flow --      Pain Score 05/08/15 0601 10     Pain Loc --      Pain Edu? --      Excl. in [place]? --      Constitutional: Alert and oriented. Well appearing and in no distress. Eyes: Conjunctivae are normal.  ENT   Head: Normocephalic and atraumatic.   Mouth/Throat: Mucous membranes are moist. Cardiovascular: Normal rate, regular rhythm.  Respiratory: Normal respiratory effort without tachypnea nor retractions.   Musculoskeletal: Patient with mild discomfort with range of motion of the right and left wrist. No swelling,  mild tenderness diffusely. No erythema. Normal cap refill. Normal radial and ulnar pulses Neurologic:  Normal speech and language. Normal sensation in the upper extremities Skin:  Skin is warm, dry and intact. No rash noted. Psychiatric: Mood and affect are normal. Patient exhibits appropriate insight and judgment.  ____________________________________________    LABS (pertinent positives/negatives)  Labs Reviewed - No data to display  ____________________________________________     ____________________________________________    RADIOLOGY I have personally reviewed any xrays that were ordered on this patient: None ____________________________________________   PROCEDURES  Procedure(s) performed: none   ____________________________________________   INITIAL IMPRESSION / ASSESSMENT AND PLAN / ED COURSE  Pertinent labs & imaging results that  were available during my care of the patient were reviewed by me and considered in my medical decision making (see chart for details).  Patient with bilateral wrist pain for one month. Suspect arthritis. We will provide splints and NSAIDs and recommended outpatient follow-up.  ____________________________________________   FINAL CLINICAL IMPRESSION(S) / ED DIAGNOSES  Final diagnoses:  Pain in both wrists     Lavonia Drafts, MD 05/08/15 [phone]

## 2015-05-08 NOTE — Discharge Instructions (Signed)
Joint Pain Joint pain, which is also called arthralgia, can be caused by many things. Joint pain often goes away when you follow your health care provider's instructions for relieving pain at home. However, joint pain can also be caused by conditions that require further treatment. Common causes of joint pain include:  Bruising in the area of the joint.  Overuse of the joint.  Wear and tear on the joints that occur with aging (osteoarthritis).  Various other forms of arthritis.  A buildup of a crystal form of uric acid in the joint (gout).  Infections of the joint (septic arthritis) or of the bone (osteomyelitis). Your health care provider may recommend medicine to help with the pain. If your joint pain continues, additional tests may be needed to diagnose your condition. HOME CARE INSTRUCTIONS Watch your condition for any changes. Follow these instructions as directed to lessen the pain that you are feeling.  Take medicines only as directed by your health care provider.  Rest the affected area for as long as your health care provider says that you should. If directed to do so, raise the painful joint above the level of your heart while you are sitting or lying down.  Do not do things that cause or worsen pain.  If directed, apply ice to the painful area:  Put ice in a plastic bag.  Place a towel between your skin and the bag.  Leave the ice on for 20 minutes, 2-3 times per day.  Wear an elastic bandage, splint, or sling as directed by your health care provider. Loosen the elastic bandage or splint if your fingers or toes become numb and tingle, or if they turn cold and blue.  Begin exercising or stretching the affected area as directed by your health care provider. Ask your health care provider what types of exercise are safe for you.  Keep all follow-up visits as directed by your health care provider. This is important. SEEK MEDICAL CARE IF:  Your pain increases, and medicine  does not help.  Your joint pain does not improve within 3 days.  You have increased bruising or swelling.  You have a fever.  You lose 10 lb (4.5 kg) or more without trying. SEEK IMMEDIATE MEDICAL CARE IF:  You are not able to move the joint.  Your fingers or toes become numb or they turn cold and blue.   This information is not intended to replace advice given to you by your health care provider. Make sure you discuss any questions you have with your health care provider.   Document Released: 01/03/2005 Document Revised: 01/24/2014 Document Reviewed: 10/15/2013 Elsevier Interactive Patient Education [employer].

## 2015-05-08 NOTE — ED Notes (Signed)
Patient ambulatory to triage with steady gait, without difficulty or distress noted; pt reports having pain to hands and wrists x month

## 2015-08-29 ENCOUNTER — Observation Stay
Admission: EM | Admit: 2015-08-29 | Discharge: 2015-08-30 | Disposition: A | Discharge status: Home / Self Care | Payer: Medicaid Other | Attending: Internal Medicine | Admitting: Internal Medicine

## 2015-08-29 ENCOUNTER — Emergency Department: Payer: Medicaid Other

## 2015-08-29 ENCOUNTER — Encounter: Payer: Self-pay | Admitting: Emergency Medicine

## 2015-08-29 DIAGNOSIS — E1122 Type 2 diabetes mellitus with diabetic chronic kidney disease: Secondary | ICD-10-CM | POA: Diagnosis not present

## 2015-08-29 DIAGNOSIS — Z9889 Other specified postprocedural states: Secondary | ICD-10-CM | POA: Insufficient documentation

## 2015-08-29 DIAGNOSIS — F17210 Nicotine dependence, cigarettes, uncomplicated: Secondary | ICD-10-CM | POA: Insufficient documentation

## 2015-08-29 DIAGNOSIS — K649 Unspecified hemorrhoids: Secondary | ICD-10-CM | POA: Insufficient documentation

## 2015-08-29 DIAGNOSIS — Z8349 Family history of other endocrine, nutritional and metabolic diseases: Secondary | ICD-10-CM | POA: Insufficient documentation

## 2015-08-29 DIAGNOSIS — E1140 Type 2 diabetes mellitus with diabetic neuropathy, unspecified: Secondary | ICD-10-CM | POA: Insufficient documentation

## 2015-08-29 DIAGNOSIS — Z794 Long term (current) use of insulin: Secondary | ICD-10-CM | POA: Insufficient documentation

## 2015-08-29 DIAGNOSIS — G4700 Insomnia, unspecified: Secondary | ICD-10-CM | POA: Insufficient documentation

## 2015-08-29 DIAGNOSIS — Z8249 Family history of ischemic heart disease and other diseases of the circulatory system: Secondary | ICD-10-CM | POA: Insufficient documentation

## 2015-08-29 DIAGNOSIS — J441 Chronic obstructive pulmonary disease with (acute) exacerbation: Secondary | ICD-10-CM | POA: Diagnosis not present

## 2015-08-29 DIAGNOSIS — K219 Gastro-esophageal reflux disease without esophagitis: Secondary | ICD-10-CM | POA: Insufficient documentation

## 2015-08-29 DIAGNOSIS — Z6839 Body mass index (BMI) 39.0-39.9, adult: Secondary | ICD-10-CM | POA: Insufficient documentation

## 2015-08-29 DIAGNOSIS — I2510 Atherosclerotic heart disease of native coronary artery without angina pectoris: Secondary | ICD-10-CM | POA: Diagnosis not present

## 2015-08-29 DIAGNOSIS — N179 Acute kidney failure, unspecified: Secondary | ICD-10-CM | POA: Insufficient documentation

## 2015-08-29 DIAGNOSIS — E1165 Type 2 diabetes mellitus with hyperglycemia: Secondary | ICD-10-CM | POA: Diagnosis not present

## 2015-08-29 DIAGNOSIS — I130 Hypertensive heart and chronic kidney disease with heart failure and stage 1 through stage 4 chronic kidney disease, or unspecified chronic kidney disease: Secondary | ICD-10-CM | POA: Diagnosis not present

## 2015-08-29 DIAGNOSIS — E871 Hypo-osmolality and hyponatremia: Secondary | ICD-10-CM | POA: Diagnosis not present

## 2015-08-29 DIAGNOSIS — E785 Hyperlipidemia, unspecified: Secondary | ICD-10-CM | POA: Diagnosis not present

## 2015-08-29 DIAGNOSIS — T380X5A Adverse effect of glucocorticoids and synthetic analogues, initial encounter: Secondary | ICD-10-CM | POA: Diagnosis not present

## 2015-08-29 DIAGNOSIS — Z9049 Acquired absence of other specified parts of digestive tract: Secondary | ICD-10-CM | POA: Insufficient documentation

## 2015-08-29 DIAGNOSIS — E669 Obesity, unspecified: Secondary | ICD-10-CM | POA: Diagnosis not present

## 2015-08-29 DIAGNOSIS — D72829 Elevated white blood cell count, unspecified: Secondary | ICD-10-CM

## 2015-08-29 DIAGNOSIS — J309 Allergic rhinitis, unspecified: Secondary | ICD-10-CM | POA: Insufficient documentation

## 2015-08-29 DIAGNOSIS — R0682 Tachypnea, not elsewhere classified: Secondary | ICD-10-CM

## 2015-08-29 DIAGNOSIS — B372 Candidiasis of skin and nail: Secondary | ICD-10-CM | POA: Diagnosis not present

## 2015-08-29 DIAGNOSIS — N189 Chronic kidney disease, unspecified: Secondary | ICD-10-CM | POA: Insufficient documentation

## 2015-08-29 DIAGNOSIS — I509 Heart failure, unspecified: Secondary | ICD-10-CM | POA: Insufficient documentation

## 2015-08-29 DIAGNOSIS — Z823 Family history of stroke: Secondary | ICD-10-CM | POA: Insufficient documentation

## 2015-08-29 DIAGNOSIS — G4730 Sleep apnea, unspecified: Secondary | ICD-10-CM | POA: Diagnosis not present

## 2015-08-29 DIAGNOSIS — B182 Chronic viral hepatitis C: Secondary | ICD-10-CM | POA: Diagnosis not present

## 2015-08-29 DIAGNOSIS — I420 Dilated cardiomyopathy: Secondary | ICD-10-CM | POA: Insufficient documentation

## 2015-08-29 DIAGNOSIS — M5116 Intervertebral disc disorders with radiculopathy, lumbar region: Secondary | ICD-10-CM | POA: Insufficient documentation

## 2015-08-29 DIAGNOSIS — F419 Anxiety disorder, unspecified: Secondary | ICD-10-CM | POA: Diagnosis not present

## 2015-08-29 DIAGNOSIS — I700 Atherosclerosis of aorta: Secondary | ICD-10-CM | POA: Insufficient documentation

## 2015-08-29 DIAGNOSIS — Z7982 Long term (current) use of aspirin: Secondary | ICD-10-CM | POA: Insufficient documentation

## 2015-08-29 DIAGNOSIS — Z23 Encounter for immunization: Secondary | ICD-10-CM | POA: Diagnosis not present

## 2015-08-29 DIAGNOSIS — M549 Dorsalgia, unspecified: Secondary | ICD-10-CM | POA: Insufficient documentation

## 2015-08-29 DIAGNOSIS — G8929 Other chronic pain: Secondary | ICD-10-CM | POA: Insufficient documentation

## 2015-08-29 DIAGNOSIS — Z803 Family history of malignant neoplasm of breast: Secondary | ICD-10-CM | POA: Insufficient documentation

## 2015-08-29 HISTORY — DX: Heart failure, unspecified: I50.9

## 2015-08-29 LAB — CBC
HCT: 39.1 % (ref 35.0–47.0)
Hemoglobin: 13.6 g/dL (ref 12.0–16.0)
MCH: 29 pg (ref 26.0–34.0)
MCHC: 35 g/dL (ref 32.0–36.0)
MCV: 82.9 fL (ref 80.0–100.0)
PLATELETS: 184 10*3/uL (ref 150–440)
RBC: 4.71 MIL/uL (ref 3.80–5.20)
RDW: 13.7 % (ref 11.5–14.5)
WBC: 18.2 10*3/uL — AB (ref 3.6–11.0)

## 2015-08-29 LAB — COMPREHENSIVE METABOLIC PANEL
ALBUMIN: 3.6 g/dL (ref 3.5–5.0)
ALT: 10 U/L — ABNORMAL LOW (ref 14–54)
ANION GAP: 10 (ref 5–15)
AST: 16 U/L (ref 15–41)
Alkaline Phosphatase: 151 U/L — ABNORMAL HIGH (ref 38–126)
BUN: 26 mg/dL — ABNORMAL HIGH (ref 6–20)
CALCIUM: 9.4 mg/dL (ref 8.9–10.3)
CHLORIDE: 93 mmol/L — AB (ref 101–111)
CO2: 25 mmol/L (ref 22–32)
Creatinine, Ser: 1.59 mg/dL — ABNORMAL HIGH (ref 0.44–1.00)
GFR calc non Af Amer: 34 mL/min — ABNORMAL LOW (ref >60)
GFR, EST AFRICAN AMERICAN: 40 mL/min — AB (ref >60)
Glucose, Bld: 518 mg/dL (ref 65–99)
POTASSIUM: 4.2 mmol/L (ref 3.5–5.1)
SODIUM: 128 mmol/L — AB (ref 135–145)
Total Bilirubin: 0.5 mg/dL (ref 0.3–1.2)
Total Protein: 9.3 g/dL — ABNORMAL HIGH (ref 6.5–8.1)

## 2015-08-29 LAB — GLUCOSE, CAPILLARY
GLUCOSE-CAPILLARY: 594 mg/dL — AB (ref 65–99)
Glucose-Capillary: >600 mg/dL (ref 65–99)
Glucose-Capillary: >600 mg/dL (ref 65–99)
Glucose-Capillary: >600 mg/dL (ref 65–99)

## 2015-08-29 LAB — TROPONIN I

## 2015-08-29 LAB — GLUCOSE, RANDOM
Glucose, Bld: 666 mg/dL (ref 65–99)
Glucose, Bld: 754 mg/dL (ref 65–99)

## 2015-08-29 LAB — BRAIN NATRIURETIC PEPTIDE: B NATRIURETIC PEPTIDE 5: 37 pg/mL (ref 0.0–100.0)

## 2015-08-29 LAB — MAGNESIUM: Magnesium: 1.6 mg/dL — ABNORMAL LOW (ref 1.7–2.4)

## 2015-08-29 LAB — PHOSPHORUS: PHOSPHORUS: 3.1 mg/dL (ref 2.5–4.6)

## 2015-08-29 MED ORDER — FUROSEMIDE 40 MG PO TABS
40.0000 mg | ORAL_TABLET | Freq: Two times a day (BID) | ORAL | Status: DC
  Administered 2015-08-29: 40 mg via ORAL
  Filled 2015-08-29: qty 1

## 2015-08-29 MED ORDER — ALBUTEROL SULFATE (2.5 MG/3ML) 0.083% IN NEBU
5.0000 mg | INHALATION_SOLUTION | Freq: Once | RESPIRATORY_TRACT | Status: AC
  Administered 2015-08-29: 5 mg via RESPIRATORY_TRACT
  Filled 2015-08-29: qty 6

## 2015-08-29 MED ORDER — INSULIN ASPART 100 UNIT/ML ~~LOC~~ SOLN: 25.0000 [IU] | SUBCUTANEOUS | Status: DC

## 2015-08-29 MED ORDER — LEVOFLOXACIN IN D5W 750 MG/150ML IV SOLN
750.0000 mg | Freq: Once | INTRAVENOUS | Status: AC
  Administered 2015-08-29: 750 mg via INTRAVENOUS
  Filled 2015-08-29: qty 150

## 2015-08-29 MED ORDER — ASPIRIN 81 MG PO CHEW
81.0000 mg | CHEWABLE_TABLET | ORAL | Status: DC
  Administered 2015-08-30: 81 mg via ORAL
  Filled 2015-08-29: qty 1

## 2015-08-29 MED ORDER — INSULIN ASPART 100 UNIT/ML ~~LOC~~ SOLN
12.0000 [IU] | SUBCUTANEOUS | Status: AC
  Administered 2015-08-29: 12 [IU] via SUBCUTANEOUS
  Filled 2015-08-29: qty 12

## 2015-08-29 MED ORDER — ACETAMINOPHEN 650 MG RE SUPP: 650.0000 mg | Freq: Four times a day (QID) | RECTAL | Status: DC | PRN

## 2015-08-29 MED ORDER — METHYLPREDNISOLONE SODIUM SUCC 125 MG IJ SOLR
60.0000 mg | Freq: Four times a day (QID) | INTRAMUSCULAR | Status: DC
  Administered 2015-08-29: 60 mg via INTRAVENOUS
  Filled 2015-08-29: qty 2

## 2015-08-29 MED ORDER — SODIUM CHLORIDE 0.9 % IV SOLN
INTRAVENOUS | Status: DC
Start: 2015-08-29 — End: 2015-08-30
  Administered 2015-08-29: 16:00:00 via INTRAVENOUS

## 2015-08-29 MED ORDER — ONDANSETRON HCL 4 MG/2ML IJ SOLN: 4.0000 mg | Freq: Four times a day (QID) | INTRAMUSCULAR | Status: DC | PRN

## 2015-08-29 MED ORDER — METHOCARBAMOL 500 MG PO TABS: 1000.0000 mg | ORAL_TABLET | Freq: Four times a day (QID) | ORAL | Status: DC | PRN

## 2015-08-29 MED ORDER — INSULIN GLARGINE 100 UNIT/ML ~~LOC~~ SOLN
25.0000 [IU] | Freq: Every day | SUBCUTANEOUS | Status: DC
  Administered 2015-08-29: 25 [IU] via SUBCUTANEOUS
  Filled 2015-08-29 (×2): qty 0.25

## 2015-08-29 MED ORDER — ALBUTEROL SULFATE (2.5 MG/3ML) 0.083% IN NEBU
INHALATION_SOLUTION | RESPIRATORY_TRACT | Status: AC
  Filled 2015-08-29: qty 12

## 2015-08-29 MED ORDER — INSULIN ASPART 100 UNIT/ML ~~LOC~~ SOLN: 0.0000 [IU] | Freq: Three times a day (TID) | SUBCUTANEOUS | Status: DC

## 2015-08-29 MED ORDER — IPRATROPIUM-ALBUTEROL 0.5-2.5 (3) MG/3ML IN SOLN
3.0000 mL | Freq: Once | RESPIRATORY_TRACT | Status: AC
  Administered 2015-08-29: 3 mL via RESPIRATORY_TRACT
  Filled 2015-08-29: qty 3

## 2015-08-29 MED ORDER — SODIUM CHLORIDE 0.9 % IV BOLUS (SEPSIS)
1000.0000 mL | Freq: Once | INTRAVENOUS | Status: AC
  Administered 2015-08-29: 1000 mL via INTRAVENOUS

## 2015-08-29 MED ORDER — TIOTROPIUM BROMIDE MONOHYDRATE 18 MCG IN CAPS
18.0000 ug | ORAL_CAPSULE | Freq: Every day | RESPIRATORY_TRACT | Status: DC
  Administered 2015-08-29: 18 ug via RESPIRATORY_TRACT
  Filled 2015-08-29: qty 5

## 2015-08-29 MED ORDER — NAPROXEN 500 MG PO TABS
500.0000 mg | ORAL_TABLET | Freq: Two times a day (BID) | ORAL | Status: DC
  Filled 2015-08-29: qty 1

## 2015-08-29 MED ORDER — FLUTICASONE PROPIONATE 50 MCG/ACT NA SUSP
2.0000 | Freq: Every day | NASAL | Status: DC | PRN
  Filled 2015-08-29: qty 16

## 2015-08-29 MED ORDER — LORATADINE 10 MG PO TABS: 10.0000 mg | ORAL_TABLET | Freq: Every evening | ORAL | Status: DC | PRN

## 2015-08-29 MED ORDER — ALBUTEROL SULFATE (2.5 MG/3ML) 0.083% IN NEBU: 3.0000 mL | INHALATION_SOLUTION | RESPIRATORY_TRACT | Status: DC | PRN

## 2015-08-29 MED ORDER — MAGNESIUM SULFATE 2 GM/50ML IV SOLN
2.0000 g | Freq: Once | INTRAVENOUS | Status: AC
  Administered 2015-08-29: 2 g via INTRAVENOUS
  Filled 2015-08-29: qty 50

## 2015-08-29 MED ORDER — CARVEDILOL 6.25 MG PO TABS
12.5000 mg | ORAL_TABLET | Freq: Two times a day (BID) | ORAL | Status: DC
  Administered 2015-08-29 – 2015-08-30 (×3): 12.5 mg via ORAL
  Filled 2015-08-29 (×3): qty 2

## 2015-08-29 MED ORDER — HYDROCODONE-ACETAMINOPHEN 5-325 MG PO TABS: 1.0000 | ORAL_TABLET | ORAL | Status: DC | PRN

## 2015-08-29 MED ORDER — PNEUMOCOCCAL VAC POLYVALENT 25 MCG/0.5ML IJ INJ
0.5000 mL | INJECTION | INTRAMUSCULAR | Status: AC
  Administered 2015-08-30: 0.5 mL via INTRAMUSCULAR
  Filled 2015-08-29: qty 0.5

## 2015-08-29 MED ORDER — ENOXAPARIN SODIUM 40 MG/0.4ML ~~LOC~~ SOLN
40.0000 mg | SUBCUTANEOUS | Status: DC
  Administered 2015-08-29: 40 mg via SUBCUTANEOUS
  Filled 2015-08-29: qty 0.4

## 2015-08-29 MED ORDER — INSULIN ASPART 100 UNIT/ML ~~LOC~~ SOLN: 0.0000 [IU] | Freq: Every day | SUBCUTANEOUS | Status: DC

## 2015-08-29 MED ORDER — INSULIN ASPART 100 UNIT/ML ~~LOC~~ SOLN
0.0000 [IU] | Freq: Three times a day (TID) | SUBCUTANEOUS | Status: DC
  Administered 2015-08-30 (×2): 20 [IU] via SUBCUTANEOUS
  Filled 2015-08-29 (×2): qty 20

## 2015-08-29 MED ORDER — METHYLPREDNISOLONE SODIUM SUCC 125 MG IJ SOLR
125.0000 mg | Freq: Once | INTRAMUSCULAR | Status: AC
  Administered 2015-08-29: 125 mg via INTRAVENOUS
  Filled 2015-08-29: qty 2

## 2015-08-29 MED ORDER — ALBUTEROL SULFATE (2.5 MG/3ML) 0.083% IN NEBU
INHALATION_SOLUTION | RESPIRATORY_TRACT | Status: AC
  Administered 2015-08-29: 10 mg via RESPIRATORY_TRACT
  Filled 2015-08-29: qty 12

## 2015-08-29 MED ORDER — ALBUTEROL SULFATE (2.5 MG/3ML) 0.083% IN NEBU: 2.5000 mg | INHALATION_SOLUTION | RESPIRATORY_TRACT | Status: AC

## 2015-08-29 MED ORDER — LEVOFLOXACIN 750 MG PO TABS: 750.0000 mg | ORAL_TABLET | ORAL | Status: DC

## 2015-08-29 MED ORDER — POTASSIUM CHLORIDE CRYS ER 20 MEQ PO TBCR
20.0000 meq | EXTENDED_RELEASE_TABLET | Freq: Once | ORAL | Status: AC
  Administered 2015-08-29: 20 meq via ORAL
  Filled 2015-08-29: qty 1

## 2015-08-29 MED ORDER — INSULIN REGULAR HUMAN 100 UNIT/ML IJ SOLN
5.0000 [IU] | Freq: Once | INTRAMUSCULAR | Status: AC
  Administered 2015-08-29: 5 [IU] via SUBCUTANEOUS
  Filled 2015-08-29: qty 0.05

## 2015-08-29 MED ORDER — ZOLPIDEM TARTRATE 5 MG PO TABS: 5.0000 mg | ORAL_TABLET | Freq: Every evening | ORAL | Status: DC | PRN

## 2015-08-29 MED ORDER — ACETAMINOPHEN 325 MG PO TABS: 650.0000 mg | ORAL_TABLET | Freq: Four times a day (QID) | ORAL | Status: DC | PRN

## 2015-08-29 MED ORDER — INSULIN ASPART 100 UNIT/ML ~~LOC~~ SOLN
30.0000 [IU] | Freq: Once | SUBCUTANEOUS | Status: AC
  Administered 2015-08-29: 30 [IU] via SUBCUTANEOUS
  Filled 2015-08-29: qty 30

## 2015-08-29 MED ORDER — HYDROCHLOROTHIAZIDE 12.5 MG PO CAPS
12.5000 mg | ORAL_CAPSULE | Freq: Every evening | ORAL | Status: DC
  Administered 2015-08-29: 12.5 mg via ORAL
  Filled 2015-08-29: qty 1

## 2015-08-29 MED ORDER — INSULIN ASPART 100 UNIT/ML ~~LOC~~ SOLN
6.0000 [IU] | Freq: Once | SUBCUTANEOUS | Status: AC
  Administered 2015-08-29: 6 [IU] via SUBCUTANEOUS
  Filled 2015-08-29: qty 6

## 2015-08-29 MED ORDER — METHYLPREDNISOLONE SODIUM SUCC 125 MG IJ SOLR: 60.0000 mg | INTRAMUSCULAR | Status: DC

## 2015-08-29 MED ORDER — IPRATROPIUM-ALBUTEROL 0.5-2.5 (3) MG/3ML IN SOLN: 3.0000 mL | Freq: Four times a day (QID) | RESPIRATORY_TRACT | Status: DC | PRN

## 2015-08-29 MED ORDER — ALBUTEROL (5 MG/ML) CONTINUOUS INHALATION SOLN: 10.0000 mg/h | INHALATION_SOLUTION | RESPIRATORY_TRACT | Status: DC

## 2015-08-29 MED ORDER — ONDANSETRON HCL 4 MG PO TABS: 4.0000 mg | ORAL_TABLET | Freq: Four times a day (QID) | ORAL | Status: DC | PRN

## 2015-08-29 MED ORDER — INSULIN ASPART 100 UNIT/ML ~~LOC~~ SOLN
20.0000 [IU] | Freq: Once | SUBCUTANEOUS | Status: AC
  Administered 2015-08-29: 20 [IU] via SUBCUTANEOUS
  Filled 2015-08-29: qty 20

## 2015-08-29 NOTE — Progress Notes (Signed)
MD paged again for high blood sugar. Advised to give 2o units novolog.

## 2015-08-29 NOTE — Progress Notes (Signed)
5 units regulin insulin given per order

## 2015-08-29 NOTE — Consult Note (Signed)
CH provided pastoral & prayer as requested.

## 2015-08-29 NOTE — ED Provider Notes (Signed)
[facility] Emergency Department Provider Note    First MD Initiated Contact with Patient 08/29/15 1137     (approximate)  I have reviewed the triage vital signs and the nursing notes.   HISTORY  Chief Complaint Shortness of Breath    HPI [name] is a 60 y.o. female who presents with 30 days of shortness of breath that has gotten worse today. Patient states she had a history of smoking as well as a history of congestive heart failure but denies any history of COPD. Denies any significant weight gain. Denies any acute chest pain. Does have a productive cough with yellow tinged phlegm. No recent fevers but does admit to chills. No nausea, vomiting or diarrhea.   Past Medical History:  Diagnosis Date  . CHF (congestive heart failure) ([place])   . Diabetes mellitus without complication [facility])     Patient Active Problem List   Diagnosis Date Noted  . COPD exacerbation ([place]) 08/29/2015    Past Surgical History:  Procedure Laterality Date  . APPENDECTOMY    . CHOLECYSTECTOMY      Prior to Admission medications   Medication Sig Start Date End Date Taking? Authorizing Provider  aspirin 81 MG chewable tablet Chew 81 mg by mouth every morning.   Yes Historical Provider, MD  carvedilol (COREG) 12.5 MG tablet Take 12.5 mg by mouth 2 (two) times daily. 08/05/15  Yes Historical Provider, MD  fluticasone (FLONASE) 50 MCG/ACT nasal spray Place 2 sprays into the nose daily as needed for rhinitis. 06/03/15  Yes Historical Provider, MD  furosemide (LASIX) 40 MG tablet Take 40 mg by mouth 2 (two) times daily. 08/17/15 08/16/16 Yes Historical Provider, MD  hydrochlorothiazide (MICROZIDE) 12.5 MG capsule Take 12.5 mg by mouth every evening. 08/05/15  Yes Historical Provider, MD  insulin aspart (NOVOLOG) 100 UNIT/ML FlexPen Inject 12 Units into the skin See admin instructions. Inject 12 units subcutaneously 3 times daily with meals. And as needed. 05/20/15 05/19/16 Yes  Historical Provider, MD  insulin regular human CONCENTRATED (HUMULIN R) 500 UNIT/ML SOLN injection Inject 5-20 Units into the skin 3 (three) times daily with meals. Pt on sliding scale   Yes Historical Provider, MD  loratadine (CLARITIN) 10 MG tablet Take 10 mg by mouth at bedtime as needed for allergies. 05/19/15 05/18/16 Yes Historical Provider, MD  metFORMIN (GLUCOPHAGE) 1000 MG tablet Take 1,000 mg by mouth 2 (two) times daily. 07/30/14  Yes Historical Provider, MD  oxyCODONE-acetaminophen (PERCOCET/ROXICET) 5-325 MG tablet Take 1 tablet by mouth 3 (three) times daily as needed for pain. 07/23/15  Yes Historical Provider, MD  PROAIR HFA 108 (90 Base) MCG/ACT inhaler Inhale 2 puffs into the lungs See admin instructions. Inhale 2 puffs by mouth every 4 to 6 hours as needed for shortness of breath/cough. 06/22/15  Yes Historical Provider, MD  ramipril (ALTACE) 10 MG capsule Take 10 mg by mouth at bedtime. 03/17/15 01/11/16 Yes Historical Provider, MD  SPIRIVA HANDIHALER 18 MCG inhalation capsule Take 18 mcg by mouth at bedtime.  06/23/15  Yes Historical Provider, MD  zolpidem (AMBIEN) 5 MG tablet Take 5 mg by mouth at bedtime as needed for sleep. 08/17/15 09/16/15 Yes Historical Provider, MD  HYDROcodone-acetaminophen (NORCO/VICODIN) 5-325 MG per tablet 1 tab PO q6 hours prn pain Patient not taking: Reported on 08/29/2015 11/07/12   Francine Graven, DO  methocarbamol (ROBAXIN) 500 MG tablet Take 2 tablets (1,000 mg total) by mouth 4 (four) times daily as needed (muscle spasm/pain). Patient not  taking: Reported on 08/29/2015 11/07/12   Francine Graven, DO  naproxen (NAPROSYN) 500 MG tablet Take 1 tablet (500 mg total) by mouth 2 (two) times daily with a meal. Patient not taking: Reported on 08/29/2015 05/08/15   Lavonia Drafts, MD    Allergies Latex and Gabapentin  History reviewed. No pertinent family history.  Social History Social History  Substance Use Topics  . Smoking status: Current Every Day Smoker      Types: Cigarettes  . Smokeless tobacco: Never Used  . Alcohol use No    Review of Systems Patient denies headaches, rhinorrhea, blurry vision, numbness, shortness of breath, chest pain, edema, cough, abdominal pain, nausea, vomiting, diarrhea, dysuria, fevers, rashes or hallucinations unless otherwise stated above in HPI. ____________________________________________   PHYSICAL EXAM:  VITAL SIGNS: Vitals:   08/29/15 1424 08/29/15 1439  BP: (!) 149/71 (!) 176/74  Pulse: 88 92  Resp: 20   Temp:      Constitutional: Alert and oriented. Moderate respiratory distress Eyes: Conjunctivae are normal. PERRL. EOMI. Head: Atraumatic. Nose: No congestion/rhinnorhea. Mouth/Throat: Mucous membranes are moist.  Oropharynx non-erythematous. Neck: No stridor. Painless ROM. No cervical spine tenderness to palpation Hematological/Lymphatic/Immunilogical: No cervical lymphadenopathy. Cardiovascular: Normal rate, regular rhythm. Grossly normal heart sounds.  Good peripheral circulation. Respiratory: Diminished breath sounds throughout with prolonged expiratory phase. No inspiratory crit crackles.. Gastrointestinal: Soft and nontender. No distention. No abdominal bruits. No CVA tenderness. Musculoskeletal: No lower extremity tenderness nor edema.  No joint effusions. Neurologic:  Normal speech and language. No gross focal neurologic deficits are appreciated. No gait instability. Skin:  Skin is warm, dry and intact. No rash noted.  ____________________________________________   LABS (all labs ordered are listed, but only abnormal results are displayed)  Results for orders placed or performed during the hospital encounter of 08/29/15 (from the past 24 hour(s))  CBC     Status: Abnormal   Collection Time: 08/29/15 11:38 AM  Result Value Ref Range   WBC 18.2 (H) 3.6 - 11.0 K/uL   RBC 4.71 3.80 - 5.20 MIL/uL   Hemoglobin 13.6 12.0 - 16.0 g/dL   HCT 39.1 35.0 - 47.0 %   MCV 82.9 80.0 - 100.0  fL   MCH 29.0 26.0 - 34.0 pg   MCHC 35.0 32.0 - 36.0 g/dL   RDW 13.7 11.5 - 14.5 %   Platelets 184 150 - 440 K/uL  Troponin I     Status: None   Collection Time: 08/29/15 11:38 AM  Result Value Ref Range   Troponin I <0.03 <0.03 ng/mL  Comprehensive metabolic panel     Status: Abnormal   Collection Time: 08/29/15 11:38 AM  Result Value Ref Range   Sodium 128 (L) 135 - 145 mmol/L   Potassium 4.2 3.5 - 5.1 mmol/L   Chloride 93 (L) 101 - 111 mmol/L   CO2 25 22 - 32 mmol/L   Glucose, Bld 518 (HH) 65 - 99 mg/dL   BUN 26 (H) 6 - 20 mg/dL   Creatinine, Ser 1.59 (H) 0.44 - 1.00 mg/dL   Calcium 9.4 8.9 - 10.3 mg/dL   Total Protein 9.3 (H) 6.5 - 8.1 g/dL   Albumin 3.6 3.5 - 5.0 g/dL   AST 16 15 - 41 U/L   ALT 10 (L) 14 - 54 U/L   Alkaline Phosphatase 151 (H) 38 - 126 U/L   Total Bilirubin 0.5 0.3 - 1.2 mg/dL   GFR calc non Af Amer 34 (L) >60 mL/min   GFR calc Af  Amer 40 (L) >60 mL/min   Anion gap 10 5 - 15  Magnesium     Status: Abnormal   Collection Time: 08/29/15 11:38 AM  Result Value Ref Range   Magnesium 1.6 (L) 1.7 - 2.4 mg/dL  Brain natriuretic peptide     Status: None   Collection Time: 08/29/15 11:38 AM  Result Value Ref Range   B Natriuretic Peptide 37.0 0.0 - 100.0 pg/mL  Phosphorus     Status: None   Collection Time: 08/29/15 11:38 AM  Result Value Ref Range   Phosphorus 3.1 2.5 - 4.6 mg/dL   ____________________________________________  EKG  Time: 11:36  Indication: SOB  Rate: 90  Rhythm: Sinus Axis: normal Other: nonspecific ST changes, no acute ischemia ____________________________________________  RADIOLOGY  CXR my read shows no evidence of acute cardiopulmonary process.  ____________________________________________   PROCEDURES  Procedure(s) performed: none    Critical Care performed: no ____________________________________________   INITIAL IMPRESSION / ASSESSMENT AND PLAN / ED COURSE  Pertinent labs & imaging results that were available  during my care of the patient were reviewed by me and considered in my medical decision making (see chart for details).  DDX: COPD, CHF, pneumonia, ACS, bronchitis,   [name] is a 60 y.o. who presents to the ED with 1 month of shortness of breath and cough that became acutely worse today. Patient arrives afebrile with normal blood pressure but markedly tachypnea neck and in moderate respiratory distress. Exam consistent with obstructive process is here diminished breath sounds bilaterally with prolonged expiratory phase. Do not appreciate any inspiratory crackles or physical exam findings to suggest congestive heart failure. Patient placed immediately on a monitor and IV access obtained. Patient was given IV steroids and addition to nebulizer treatment for acute respiratory failure. Will check labs for any signs of underlying infectious process and cardiac workup  Clinical Course  Comment By Time  Patient reassessed. Improvement in breath sounds but still very tight prolonged expiratory phase after hour-long nebulizer. Magnesium is low at 1.6 therefore will order IV magnesium. CMP with evidence of hyperglycemia and chronic kidney disease. We'll provide insulin to treat hyperglycemia. Her troponin is negative.  X-ray looks more consistent with COPD exacerbation possible pneumonia. No evidence of heart failure. As her symptoms have significant improvement with albuterol treatment do not feel this is clinically consistent with PE. Merlyn Lot, MD 08/12 1238  Repeat exam patient still with tachypnea and prolonged recovery phase. We'll give IV antibiotics for a COPD exacerbation given acute leukocytosis. Based on persistent tachypnea do feel patient will require admission for further evaluation and management.  Have discussed with the patient and available family all diagnostics and treatments performed thus far and all questions were answered to the best of my ability. The patient demonstrates  understanding and agreement with plan.  Merlyn Lot, MD 08/12 1312     ____________________________________________   FINAL CLINICAL IMPRESSION(S) / ED DIAGNOSES  Final diagnoses:  COPD exacerbation ([place])  Tachypnea  Leukocytosis      NEW MEDICATIONS STARTED DURING THIS VISIT:  Current Discharge Medication List       Note:  This document was prepared using Dragon voice recognition software and may include unintentional dictation errors.    Merlyn Lot, MD 08/29/15 1626

## 2015-08-29 NOTE — Progress Notes (Signed)
Pt fingerstick was >600, prime doc paged. Waiting on callback.

## 2015-08-29 NOTE — ED Notes (Addendum)
Dr Quentin Cornwall notified [phone]

## 2015-08-29 NOTE — Progress Notes (Signed)
Paged MD for blood glucose still over 600 by glucometer.

## 2015-08-29 NOTE — ED Triage Notes (Signed)
C/o SHOB X 30 days but worse today. Pt labored and having difficulty speaking in full sentences.

## 2015-08-29 NOTE — Progress Notes (Signed)
Paged MD for high result of 754 glucose, administered 30 units insulin, will continue to monitor

## 2015-08-29 NOTE — Progress Notes (Signed)
Patient's BG was over 600 per glucometer. Serum glucose ordered and MD notified. 12 units insulin ordered once and given. Will recheck BG in an hour.
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
## 2015-08-30 LAB — GLUCOSE, CAPILLARY
GLUCOSE-CAPILLARY: 448 mg/dL — AB (ref 65–99)
GLUCOSE-CAPILLARY: 343 mg/dL — AB (ref 65–99)
GLUCOSE-CAPILLARY: 386 mg/dL — AB (ref 65–99)
Glucose-Capillary: 428 mg/dL — ABNORMAL HIGH (ref 65–99)

## 2015-08-30 LAB — BASIC METABOLIC PANEL
ANION GAP: 8 (ref 5–15)
BUN: 37 mg/dL — ABNORMAL HIGH (ref 6–20)
CHLORIDE: 97 mmol/L — AB (ref 101–111)
CO2: 26 mmol/L (ref 22–32)
Calcium: 9 mg/dL (ref 8.9–10.3)
Creatinine, Ser: 1.54 mg/dL — ABNORMAL HIGH (ref 0.44–1.00)
GFR calc Af Amer: 41 mL/min — ABNORMAL LOW (ref >60)
GFR calc non Af Amer: 36 mL/min — ABNORMAL LOW (ref >60)
GLUCOSE: 402 mg/dL — AB (ref 65–99)
POTASSIUM: 4.6 mmol/L (ref 3.5–5.1)
Sodium: 131 mmol/L — ABNORMAL LOW (ref 135–145)

## 2015-08-30 LAB — CBC
HEMATOCRIT: 37.4 % (ref 35.0–47.0)
HEMOGLOBIN: 12.7 g/dL (ref 12.0–16.0)
MCH: 28.3 pg (ref 26.0–34.0)
MCHC: 33.9 g/dL (ref 32.0–36.0)
MCV: 83.4 fL (ref 80.0–100.0)
Platelets: 170 10*3/uL (ref 150–440)
RBC: 4.49 MIL/uL (ref 3.80–5.20)
RDW: 13.6 % (ref 11.5–14.5)
WBC: 18.2 10*3/uL — ABNORMAL HIGH (ref 3.6–11.0)

## 2015-08-30 LAB — MAGNESIUM: Magnesium: 1.8 mg/dL (ref 1.7–2.4)

## 2015-08-30 LAB — HEMOGLOBIN A1C: Hgb A1c MFr Bld: 12.7 % — ABNORMAL HIGH (ref 4.0–6.0)

## 2015-08-30 MED ORDER — INSULIN REGULAR HUMAN 100 UNIT/ML IJ SOLN
5.0000 [IU] | Freq: Once | INTRAMUSCULAR | Status: AC
  Administered 2015-08-30: 5 [IU] via INTRAVENOUS
  Filled 2015-08-30: qty 0.05

## 2015-08-30 MED ORDER — INSULIN ASPART 100 UNIT/ML ~~LOC~~ SOLN
12.0000 [IU] | Freq: Three times a day (TID) | SUBCUTANEOUS | Status: DC
  Administered 2015-08-30: 12 [IU] via SUBCUTANEOUS
  Filled 2015-08-30: qty 12

## 2015-08-30 MED ORDER — LEVOFLOXACIN 250 MG PO TABS
250.0000 mg | ORAL_TABLET | Freq: Every day | ORAL | Status: DC
  Administered 2015-08-30: 250 mg via ORAL
  Filled 2015-08-30: qty 1

## 2015-08-30 MED ORDER — INSULIN REGULAR HUMAN (CONC) 500 UNIT/ML ~~LOC~~ SOLN: 20.0000 [IU] | Freq: Two times a day (BID) | SUBCUTANEOUS | 0 refills | Status: DC

## 2015-08-30 MED ORDER — PREDNISONE 50 MG PO TABS: 50.0000 mg | ORAL_TABLET | Freq: Every day | ORAL | Status: DC

## 2015-08-30 MED ORDER — LEVOFLOXACIN 250 MG PO TABS: 250.0000 mg | ORAL_TABLET | Freq: Every day | ORAL | 0 refills | Status: DC

## 2015-08-30 MED ORDER — PREDNISONE 10 MG PO TABS: ORAL_TABLET | ORAL | 0 refills | Status: DC

## 2015-08-30 NOTE — Progress Notes (Signed)
Patient discharging home. Instructions given, verbalized understanding. IV removed.

## 2015-08-30 NOTE — Discharge Instructions (Signed)
Keep log of your sugars at home

## 2015-08-30 NOTE — Progress Notes (Signed)
BS 428, MD notified. Give 20 units insulin.

## 2015-08-30 NOTE — Discharge Summary (Signed)
[place] at [place] NAME: [name]    MR#:  [id number]  DATE OF BIRTH:  1955/11/16  DATE OF ADMISSION:  08/29/2015 ADMITTING PHYSICIAN: Fritzi Mandes, MD  DATE OF DISCHARGE: 08/30/15  PRIMARY CARE PHYSICIAN: No PCP Per Patient    ADMISSION DIAGNOSIS:  sob  DISCHARGE DIAGNOSIS:  Acute COPD exacerbation  SECONDARY DIAGNOSIS:   Past Medical History:  Diagnosis Date  . CHF (congestive heart failure) ([place])   . Diabetes mellitus without complication [facility])     HOSPITAL COURSE:   60 y.o. female with a history of CHF, DM now being admitted with: 1. COPD exacerbation possibly with CAP - pt received IV steroids, nebs, O2 PRN, continue Levaquin given elevated white count and history of chills.   -feels better Taper po steroids -sats>92% on RA  2. Hyponatremia due to high sugars secondary to steroids - tighter sugar control 3. AKI on CKD -resume home meds Pt appears euvolemic  4. Hypomagnesemia -Replaced IV in ED.   5. IDDM with hyperglycemia -a1c 12 Pt is on Humalog(short) and Novolog(long) acting -sugars improving but high due to steroids -I have advised her to get back with her PCP and she should also see endocrinoalogy for better sugar control and management as out pt.  6. History of CHF, HTN, chronic back pain, seasonal allergies - Continue home meds  Overall feels better D/c home CONSULTS OBTAINED:    DRUG ALLERGIES:   Allergies  Allergen Reactions  . Latex Anaphylaxis and Rash  . Gabapentin Nausea And Vomiting    DISCHARGE MEDICATIONS:   Current Discharge Medication List    START taking these medications   Details  levofloxacin (LEVAQUIN) 250 MG tablet Take 1 tablet (250 mg total) by mouth daily. Qty: 5 tablet, Refills: 0    predniSONE (DELTASONE) 10 MG tablet Start 50 mg and taper by 10 mg then stop Qty: 15 tablet, Refills: 0      CONTINUE these medications which have CHANGED   Details   insulin regular human CONCENTRATED (HUMULIN R) 500 UNIT/ML injection Inject 0.04 mLs (20 Units total) into the skin 2 (two) times daily with a meal. Pt on sliding scale Qty: 20 mL, Refills: 0      CONTINUE these medications which have NOT CHANGED   Details  aspirin 81 MG chewable tablet Chew 81 mg by mouth every morning.    carvedilol (COREG) 12.5 MG tablet Take 12.5 mg by mouth 2 (two) times daily.    fluticasone (FLONASE) 50 MCG/ACT nasal spray Place 2 sprays into the nose daily as needed for rhinitis.    furosemide (LASIX) 40 MG tablet Take 40 mg by mouth 2 (two) times daily.    hydrochlorothiazide (MICROZIDE) 12.5 MG capsule Take 12.5 mg by mouth every evening.    insulin aspart (NOVOLOG) 100 UNIT/ML FlexPen Inject 12 Units into the skin See admin instructions. Inject 12 units subcutaneously 3 times daily with meals. And as needed.    loratadine (CLARITIN) 10 MG tablet Take 10 mg by mouth at bedtime as needed for allergies.    metFORMIN (GLUCOPHAGE) 1000 MG tablet Take 1,000 mg by mouth 2 (two) times daily.    oxyCODONE-acetaminophen (PERCOCET/ROXICET) 5-325 MG tablet Take 1 tablet by mouth 3 (three) times daily as needed for pain.    PROAIR HFA 108 (90 Base) MCG/ACT inhaler Inhale 2 puffs into the lungs See admin instructions. Inhale 2 puffs by mouth every 4 to 6 hours as needed for  shortness of breath/cough. Refills: 5    ramipril (ALTACE) 10 MG capsule Take 10 mg by mouth at bedtime.    SPIRIVA HANDIHALER 18 MCG inhalation capsule Take 18 mcg by mouth at bedtime.  Refills: 5    zolpidem (AMBIEN) 5 MG tablet Take 5 mg by mouth at bedtime as needed for sleep.    HYDROcodone-acetaminophen (NORCO/VICODIN) 5-325 MG per tablet 1 tab PO q6 hours prn pain Qty: 15 tablet, Refills: 0    methocarbamol (ROBAXIN) 500 MG tablet Take 2 tablets (1,000 mg total) by mouth 4 (four) times daily as needed (muscle spasm/pain). Qty: 25 tablet, Refills: 0    naproxen (NAPROSYN) 500 MG tablet  Take 1 tablet (500 mg total) by mouth 2 (two) times daily with a meal. Qty: 20 tablet, Refills: 2        If you experience worsening of your admission symptoms, develop shortness of breath, life threatening emergency, suicidal or homicidal thoughts you must seek medical attention immediately by calling 911 or calling your MD immediately  if symptoms less severe.  You Must read complete instructions/literature along with all the possible adverse reactions/side effects for all the Medicines you take and that have been prescribed to you. Take any new Medicines after you have completely understood and accept all the possible adverse reactions/side effects.   Please note  You were cared for by a hospitalist during your hospital stay. If you have any questions about your discharge medications or the care you received while you were in the hospital after you are discharged, you can call the unit and asked to speak with the hospitalist on call if the hospitalist that took care of you is not available. Once you are discharged, your primary care physician will handle any further medical issues. Please note that NO REFILLS for any discharge medications will be authorized once you are discharged, as it is imperative that you return to your primary care physician (or establish a relationship with a primary care physician if you do not have one) for your aftercare needs so that they can reassess your need for medications and monitor your lab values. Today   SUBJECTIVE    I am feeling well/ can I go home? VITAL SIGNS:  Blood pressure 137/82, pulse 78, temperature 97.8 F (36.6 C), temperature source Oral, resp. rate 20, height 5' 5"  (1.651 m), weight 107.5 kg (237 lb), SpO2 96 %.  I/O:   Intake/Output Summary (Last 24 hours) at 08/30/15 1113 Last data filed at 08/30/15 0800  Gross per 24 hour  Intake             2442 ml  Output             1300 ml  Net             1142 ml    PHYSICAL EXAMINATION:   GENERAL:  60 y.o.-year-old patient lying in the bed with no acute distress. obese EYES: Pupils equal, round, reactive to light and accommodation. No scleral icterus. Extraocular muscles intact.  HEENT: Head atraumatic, normocephalic. Oropharynx and nasopharynx clear.  NECK:  Supple, no jugular venous distention. No thyroid enlargement, no tenderness.  LUNGS: Normal breath sounds bilaterally, no wheezing, rales,rhonchi or crepitation. No use of accessory muscles of respiration.  CARDIOVASCULAR: S1, S2 normal. No murmurs, rubs, or gallops.  ABDOMEN: Soft, non-tender, non-distended. Bowel sounds present. No organomegaly or mass.  EXTREMITIES: No pedal edema, cyanosis, or clubbing.  NEUROLOGIC: Cranial nerves II through XII are intact. Muscle  strength 5/5 in all extremities. Sensation intact. Gait not checked.  PSYCHIATRIC: The patient is alert and oriented x 3.  SKIN: No obvious rash, lesion, or ulcer.   DATA REVIEW:   CBC   Recent Labs Lab 08/30/15 0426  WBC 18.2*  HGB 12.7  HCT 37.4  PLT 170    Chemistries   Recent Labs Lab 08/29/15 1138  08/30/15 0426  NA 128*  --  131*  K 4.2  --  4.6  CL 93*  --  97*  CO2 25  --  26  GLUCOSE 518*  < > 402*  BUN 26*  --  37*  CREATININE 1.59*  --  1.54*  CALCIUM 9.4  --  9.0  MG 1.6*  --  1.8  AST 16  --   --   ALT 10*  --   --   ALKPHOS 151*  --   --   BILITOT 0.5  --   --   < > = values in this interval not displayed.  Microbiology Results   Recent Results (from the past 240 hour(s))  Aerobic Culture (superficial specimen)     Status: None (Preliminary result)   Collection Time: 08/29/15  4:54 PM  Result Value Ref Range Status   Specimen Description ABDOMEN  Final   Special Requests Immunocompromised  Final   Gram Stain   Final    NO WBC SEEN FEW GRAM POSITIVE COCCI IN PAIRS RARE GRAM POSITIVE RODS Performed at [facility]    Culture PENDING  Incomplete   Report Status PENDING  Incomplete    RADIOLOGY:   Dg Chest Portable 1 View  Result Date: 08/29/2015 CLINICAL DATA:  Pt to ED c/o shortness of breath x 1 month, worse today, pt labored and having difficulty speaking in full sentences; h/o chf. EXAM: PORTABLE CHEST - 1 VIEW COMPARISON:  04/01/2014 FINDINGS: Lungs are clear. Heart size and mediastinal contours are within normal limits. Atheromatous aorta. No effusion. Visualized bones unremarkable. IMPRESSION: 1. No acute cardiopulmonary disease. 2.  Aortic Atherosclerosis (ICD10-170.0) Electronically Signed   By: Lucrezia Europe M.D.   On: 08/29/2015 11:54     Management plans discussed with the patient, family and they are in agreement.  CODE STATUS:     Code Status Orders        Start     Ordered   08/29/15 1439  Full code  Continuous     08/29/15 1438    Code Status History    Date Active Date Inactive Code Status Order ID Comments User Context   This patient has a current code status but no historical code status.      TOTAL TIME TAKING CARE OF THIS PATIENT: 40 minutes.    Crysten Kaman M.D on 08/30/2015 at 11:13 AM  Between 7am to 6pm - Pager - [phone] After 6pm go to [email or web address] - password [email or web address] [facility]  [place] [facility]  Office  [phone]  CC: Primary care physician; No PCP Per Patient

## 2015-08-30 NOTE — Progress Notes (Signed)
Paged MD in reference to pts blood sugar of 448

## 2015-09-01 LAB — AEROBIC CULTURE W GRAM STAIN (SUPERFICIAL SPECIMEN): Gram Stain: NONE SEEN

## 2015-09-01 LAB — AEROBIC CULTURE  (SUPERFICIAL SPECIMEN)

## 2015-10-28 ENCOUNTER — Inpatient Hospital Stay
Admission: EM | Admit: 2015-10-28 | Discharge: 2015-10-29 | DRG: 190 | Disposition: A | Discharge status: Home / Self Care | Payer: Medicaid Other | Attending: Internal Medicine | Admitting: Internal Medicine

## 2015-10-28 ENCOUNTER — Emergency Department: Payer: Medicaid Other

## 2015-10-28 ENCOUNTER — Inpatient Hospital Stay: Payer: Medicaid Other

## 2015-10-28 ENCOUNTER — Encounter: Payer: Self-pay | Admitting: Emergency Medicine

## 2015-10-28 ENCOUNTER — Inpatient Hospital Stay
Admit: 2015-10-28 | Discharge: 2015-10-28 | Disposition: A | Discharge status: Home / Self Care | Payer: Medicaid Other | Attending: Internal Medicine | Admitting: Internal Medicine

## 2015-10-28 DIAGNOSIS — Z7982 Long term (current) use of aspirin: Secondary | ICD-10-CM

## 2015-10-28 DIAGNOSIS — E1165 Type 2 diabetes mellitus with hyperglycemia: Secondary | ICD-10-CM | POA: Diagnosis present

## 2015-10-28 DIAGNOSIS — Z91040 Latex allergy status: Secondary | ICD-10-CM

## 2015-10-28 DIAGNOSIS — Z22322 Carrier or suspected carrier of Methicillin resistant Staphylococcus aureus: Secondary | ICD-10-CM | POA: Diagnosis not present

## 2015-10-28 DIAGNOSIS — I110 Hypertensive heart disease with heart failure: Secondary | ICD-10-CM | POA: Diagnosis present

## 2015-10-28 DIAGNOSIS — J441 Chronic obstructive pulmonary disease with (acute) exacerbation: Secondary | ICD-10-CM | POA: Diagnosis present

## 2015-10-28 DIAGNOSIS — J440 Chronic obstructive pulmonary disease with acute lower respiratory infection: Principal | ICD-10-CM | POA: Diagnosis present

## 2015-10-28 DIAGNOSIS — Z794 Long term (current) use of insulin: Secondary | ICD-10-CM | POA: Diagnosis not present

## 2015-10-28 DIAGNOSIS — Z23 Encounter for immunization: Secondary | ICD-10-CM

## 2015-10-28 DIAGNOSIS — I509 Heart failure, unspecified: Secondary | ICD-10-CM | POA: Diagnosis present

## 2015-10-28 DIAGNOSIS — J189 Pneumonia, unspecified organism: Secondary | ICD-10-CM | POA: Diagnosis present

## 2015-10-28 DIAGNOSIS — R0602 Shortness of breath: Secondary | ICD-10-CM | POA: Diagnosis not present

## 2015-10-28 DIAGNOSIS — Z79899 Other long term (current) drug therapy: Secondary | ICD-10-CM | POA: Diagnosis not present

## 2015-10-28 DIAGNOSIS — F17200 Nicotine dependence, unspecified, uncomplicated: Secondary | ICD-10-CM | POA: Diagnosis present

## 2015-10-28 DIAGNOSIS — Z885 Allergy status to narcotic agent status: Secondary | ICD-10-CM

## 2015-10-28 DIAGNOSIS — Y95 Nosocomial condition: Secondary | ICD-10-CM | POA: Diagnosis present

## 2015-10-28 LAB — CBC
HCT: 35.2 % (ref 35.0–47.0)
Hemoglobin: 11.9 g/dL — ABNORMAL LOW (ref 12.0–16.0)
MCH: 28.1 pg (ref 26.0–34.0)
MCHC: 33.7 g/dL (ref 32.0–36.0)
MCV: 83.3 fL (ref 80.0–100.0)
PLATELETS: 136 10*3/uL — AB (ref 150–440)
RBC: 4.23 MIL/uL (ref 3.80–5.20)
RDW: 14.7 % — AB (ref 11.5–14.5)
WBC: 14.6 10*3/uL — ABNORMAL HIGH (ref 3.6–11.0)

## 2015-10-28 LAB — BASIC METABOLIC PANEL
Anion gap: 8 (ref 5–15)
BUN: 17 mg/dL (ref 6–20)
CALCIUM: 8.8 mg/dL — AB (ref 8.9–10.3)
CO2: 22 mmol/L (ref 22–32)
CREATININE: 1.18 mg/dL — AB (ref 0.44–1.00)
Chloride: 106 mmol/L (ref 101–111)
GFR calc non Af Amer: 49 mL/min — ABNORMAL LOW (ref >60)
GFR, EST AFRICAN AMERICAN: 57 mL/min — AB (ref >60)
GLUCOSE: 253 mg/dL — AB (ref 65–99)
Potassium: 3.5 mmol/L (ref 3.5–5.1)
Sodium: 136 mmol/L (ref 135–145)

## 2015-10-28 LAB — GLUCOSE, CAPILLARY
GLUCOSE-CAPILLARY: 441 mg/dL — AB (ref 65–99)
GLUCOSE-CAPILLARY: 425 mg/dL — AB (ref 65–99)
Glucose-Capillary: 545 mg/dL (ref 65–99)

## 2015-10-28 LAB — MRSA PCR SCREENING: MRSA by PCR: POSITIVE — AB

## 2015-10-28 LAB — EXPECTORATED SPUTUM ASSESSMENT W REFEX TO RESP CULTURE

## 2015-10-28 LAB — MAGNESIUM: MAGNESIUM: 1.6 mg/dL — AB (ref 1.7–2.4)

## 2015-10-28 LAB — EXPECTORATED SPUTUM ASSESSMENT W GRAM STAIN, RFLX TO RESP C

## 2015-10-28 LAB — TROPONIN I: Troponin I: <0.03 ng/mL (ref <0.03)

## 2015-10-28 MED ORDER — ONDANSETRON HCL 4 MG PO TABS: 4.0000 mg | ORAL_TABLET | Freq: Four times a day (QID) | ORAL | Status: DC | PRN

## 2015-10-28 MED ORDER — INSULIN REGULAR HUMAN (CONC) 500 UNIT/ML ~~LOC~~ SOLN: 12.0000 [IU] | Freq: Two times a day (BID) | SUBCUTANEOUS | Status: DC

## 2015-10-28 MED ORDER — FUROSEMIDE 40 MG PO TABS
40.0000 mg | ORAL_TABLET | Freq: Every day | ORAL | Status: DC
  Administered 2015-10-28 – 2015-10-29 (×2): 40 mg via ORAL
  Filled 2015-10-28 (×2): qty 1

## 2015-10-28 MED ORDER — INFLUENZA VAC SPLIT QUAD 0.5 ML IM SUSY
0.5000 mL | PREFILLED_SYRINGE | INTRAMUSCULAR | Status: AC
  Administered 2015-10-29: 0.5 mL via INTRAMUSCULAR
  Filled 2015-10-28: qty 0.5

## 2015-10-28 MED ORDER — ONDANSETRON HCL 4 MG/2ML IJ SOLN: 4.0000 mg | Freq: Four times a day (QID) | INTRAMUSCULAR | Status: DC | PRN

## 2015-10-28 MED ORDER — HYDROCHLOROTHIAZIDE 12.5 MG PO CAPS
12.5000 mg | ORAL_CAPSULE | Freq: Every evening | ORAL | Status: DC
  Administered 2015-10-28: 12.5 mg via ORAL
  Filled 2015-10-28: qty 1

## 2015-10-28 MED ORDER — LORATADINE 10 MG PO TABS: 10.0000 mg | ORAL_TABLET | Freq: Every evening | ORAL | Status: DC | PRN

## 2015-10-28 MED ORDER — ALBUTEROL SULFATE (2.5 MG/3ML) 0.083% IN NEBU
5.0000 mg | INHALATION_SOLUTION | Freq: Once | RESPIRATORY_TRACT | Status: AC
  Administered 2015-10-28: 5 mg via RESPIRATORY_TRACT

## 2015-10-28 MED ORDER — ALBUTEROL SULFATE (2.5 MG/3ML) 0.083% IN NEBU
5.0000 mg | INHALATION_SOLUTION | Freq: Once | RESPIRATORY_TRACT | Status: DC
  Filled 2015-10-28: qty 6

## 2015-10-28 MED ORDER — SODIUM CHLORIDE 0.9 % IV BOLUS (SEPSIS)
500.0000 mL | Freq: Once | INTRAVENOUS | Status: AC
  Administered 2015-10-28: 500 mL via INTRAVENOUS

## 2015-10-28 MED ORDER — ACETAMINOPHEN 650 MG RE SUPP: 650.0000 mg | Freq: Four times a day (QID) | RECTAL | Status: DC | PRN

## 2015-10-28 MED ORDER — ZOLPIDEM TARTRATE 5 MG PO TABS: 5.0000 mg | ORAL_TABLET | Freq: Every evening | ORAL | Status: DC | PRN

## 2015-10-28 MED ORDER — ENOXAPARIN SODIUM 40 MG/0.4ML ~~LOC~~ SOLN
40.0000 mg | SUBCUTANEOUS | Status: DC
  Administered 2015-10-28: 40 mg via SUBCUTANEOUS
  Filled 2015-10-28: qty 0.4

## 2015-10-28 MED ORDER — OXYCODONE-ACETAMINOPHEN 5-325 MG PO TABS
1.0000 | ORAL_TABLET | Freq: Three times a day (TID) | ORAL | Status: DC | PRN
  Administered 2015-10-28 – 2015-10-29 (×2): 1 via ORAL
  Filled 2015-10-28 (×2): qty 1

## 2015-10-28 MED ORDER — ASPIRIN 81 MG PO CHEW
81.0000 mg | CHEWABLE_TABLET | ORAL | Status: DC
  Administered 2015-10-29: 81 mg via ORAL
  Filled 2015-10-28: qty 1

## 2015-10-28 MED ORDER — ALBUTEROL SULFATE (2.5 MG/3ML) 0.083% IN NEBU: 3.0000 mL | INHALATION_SOLUTION | RESPIRATORY_TRACT | Status: DC

## 2015-10-28 MED ORDER — MAGNESIUM SULFATE 2 GM/50ML IV SOLN
2.0000 g | Freq: Once | INTRAVENOUS | Status: AC
  Administered 2015-10-28: 2 g via INTRAVENOUS
  Filled 2015-10-28: qty 50

## 2015-10-28 MED ORDER — CARVEDILOL 25 MG PO TABS
12.5000 mg | ORAL_TABLET | Freq: Two times a day (BID) | ORAL | Status: DC
  Administered 2015-10-28 – 2015-10-29 (×2): 12.5 mg via ORAL
  Filled 2015-10-28 (×2): qty 1

## 2015-10-28 MED ORDER — METHYLPREDNISOLONE SODIUM SUCC 40 MG IJ SOLR
40.0000 mg | Freq: Three times a day (TID) | INTRAMUSCULAR | Status: DC
  Administered 2015-10-28 – 2015-10-29 (×3): 40 mg via INTRAVENOUS
  Filled 2015-10-28 (×3): qty 1

## 2015-10-28 MED ORDER — RAMIPRIL 10 MG PO CAPS
10.0000 mg | ORAL_CAPSULE | Freq: Every day | ORAL | Status: DC
  Administered 2015-10-28: 10 mg via ORAL
  Filled 2015-10-28: qty 1

## 2015-10-28 MED ORDER — IPRATROPIUM-ALBUTEROL 0.5-2.5 (3) MG/3ML IN SOLN
3.0000 mL | RESPIRATORY_TRACT | Status: DC
  Administered 2015-10-28 – 2015-10-29 (×5): 3 mL via RESPIRATORY_TRACT
  Filled 2015-10-28 (×5): qty 3

## 2015-10-28 MED ORDER — INSULIN ASPART 100 UNIT/ML ~~LOC~~ SOLN
15.0000 [IU] | Freq: Once | SUBCUTANEOUS | Status: AC
  Administered 2015-10-28: 15 [IU] via SUBCUTANEOUS
  Filled 2015-10-28: qty 15

## 2015-10-28 MED ORDER — MUPIROCIN 2 % EX OINT
1.0000 "application " | TOPICAL_OINTMENT | Freq: Two times a day (BID) | CUTANEOUS | Status: DC
  Administered 2015-10-28 – 2015-10-29 (×2): 1 via NASAL
  Filled 2015-10-28: qty 22

## 2015-10-28 MED ORDER — INSULIN ASPART 100 UNIT/ML ~~LOC~~ SOLN: 0.0000 [IU] | Freq: Every day | SUBCUTANEOUS | Status: DC

## 2015-10-28 MED ORDER — IOPAMIDOL (ISOVUE-370) INJECTION 76%
75.0000 mL | Freq: Once | INTRAVENOUS | Status: AC | PRN
  Administered 2015-10-28: 75 mL via INTRAVENOUS

## 2015-10-28 MED ORDER — INSULIN ASPART 100 UNIT/ML ~~LOC~~ SOLN
12.0000 [IU] | Freq: Once | SUBCUTANEOUS | Status: AC
  Administered 2015-10-28: 12 [IU] via SUBCUTANEOUS
  Filled 2015-10-28: qty 12

## 2015-10-28 MED ORDER — SODIUM CHLORIDE 0.9% FLUSH
3.0000 mL | Freq: Two times a day (BID) | INTRAVENOUS | Status: DC
  Administered 2015-10-28: 3 mL via INTRAVENOUS

## 2015-10-28 MED ORDER — CEFEPIME-DEXTROSE 2 GM/50ML IV SOLR
2.0000 g | Freq: Three times a day (TID) | INTRAVENOUS | Status: DC
  Administered 2015-10-28 – 2015-10-29 (×3): 2 g via INTRAVENOUS
  Filled 2015-10-28 (×5): qty 50

## 2015-10-28 MED ORDER — ACETAMINOPHEN 325 MG PO TABS: 650.0000 mg | ORAL_TABLET | Freq: Four times a day (QID) | ORAL | Status: DC | PRN

## 2015-10-28 MED ORDER — CHLORHEXIDINE GLUCONATE CLOTH 2 % EX PADS
6.0000 | MEDICATED_PAD | Freq: Every day | CUTANEOUS | Status: DC
  Administered 2015-10-29: 6 via TOPICAL

## 2015-10-28 MED ORDER — INSULIN DETEMIR 100 UNIT/ML ~~LOC~~ SOLN
20.0000 [IU] | Freq: Every day | SUBCUTANEOUS | Status: DC
  Administered 2015-10-28: 20 [IU] via SUBCUTANEOUS
  Filled 2015-10-28 (×2): qty 0.2

## 2015-10-28 MED ORDER — LEVOFLOXACIN IN D5W 500 MG/100ML IV SOLN
500.0000 mg | Freq: Once | INTRAVENOUS | Status: AC
  Administered 2015-10-28: 500 mg via INTRAVENOUS
  Filled 2015-10-28: qty 100

## 2015-10-28 MED ORDER — INSULIN ASPART 100 UNIT/ML ~~LOC~~ SOLN
12.0000 [IU] | Freq: Three times a day (TID) | SUBCUTANEOUS | Status: DC
  Administered 2015-10-28: 12 [IU] via SUBCUTANEOUS
  Filled 2015-10-28: qty 12

## 2015-10-28 MED ORDER — TIOTROPIUM BROMIDE MONOHYDRATE 18 MCG IN CAPS
18.0000 ug | ORAL_CAPSULE | Freq: Every day | RESPIRATORY_TRACT | Status: DC
  Administered 2015-10-28: 18 ug via RESPIRATORY_TRACT
  Filled 2015-10-28: qty 5

## 2015-10-28 MED ORDER — SENNOSIDES-DOCUSATE SODIUM 8.6-50 MG PO TABS: 1.0000 | ORAL_TABLET | Freq: Every evening | ORAL | Status: DC | PRN

## 2015-10-28 MED ORDER — INSULIN ASPART 100 UNIT/ML ~~LOC~~ SOLN
0.0000 [IU] | Freq: Three times a day (TID) | SUBCUTANEOUS | Status: DC
  Administered 2015-10-28: 20 [IU] via SUBCUTANEOUS
  Administered 2015-10-29: 19 [IU] via SUBCUTANEOUS
  Administered 2015-10-29: 20 [IU] via SUBCUTANEOUS
  Filled 2015-10-28: qty 15
  Filled 2015-10-28 (×2): qty 20

## 2015-10-28 MED ORDER — POTASSIUM CHLORIDE CRYS ER 20 MEQ PO TBCR
20.0000 meq | EXTENDED_RELEASE_TABLET | Freq: Every day | ORAL | Status: DC
  Administered 2015-10-28 – 2015-10-29 (×2): 20 meq via ORAL
  Filled 2015-10-28 (×2): qty 1

## 2015-10-28 MED ORDER — SODIUM CHLORIDE 0.9 % IV SOLN
INTRAVENOUS | Status: DC
  Administered 2015-10-28 – 2015-10-29 (×2): via INTRAVENOUS

## 2015-10-28 NOTE — Progress Notes (Signed)
Notified Dr. Verdell Carmine that lab called to notify that MRSA PCR was positive. Per MD will place pt on isolation and place orders for positive standing orders

## 2015-10-28 NOTE — ED Notes (Signed)
Did EKG on  Pt.

## 2015-10-28 NOTE — ED Notes (Signed)
Spoke with Nicki in the pharmacy, who states that per the pharmacist, levaquin and Magnesium cannot be run through the same IV line.  At this time Levaquin will be held until the Magnesium infusion is completed.

## 2015-10-28 NOTE — ED Notes (Signed)
Gave pt food tray.

## 2015-10-28 NOTE — Progress Notes (Signed)
Notified Dr. Benjie Karvonen that patient had a blood glucose of 441. Per MD give 12 units meal coverage and 20 units for sliding scale

## 2015-10-28 NOTE — Progress Notes (Signed)
Will discontinue order for continuous pulse ox as it was placed by ED MD

## 2015-10-28 NOTE — H&P (Signed)
[facility] [place] NAME: Brandon Scarbrough    MR#:  [id number]  DATE OF BIRTH:  09-02-1955  DATE OF ADMISSION:  10/28/2015  PRIMARY CARE PHYSICIAN: [facility]   REQUESTING/REFERRING PHYSICIAN: Dr. Sharlett Iles CHIEF COMPLAINT:   Shortness of breath and cough HISTORY OF PRESENT ILLNESS:  [name]  is a 60 y.o. female with a known history of COPD with when necessary oxygen, congestive heart failure unspecified and diabetes who presents with above complaint. Patient reports over the past 3 days she has had increasing shortness of breath, cough and wheezing. She also reports dyspnea on exertion, PND and orthopnea however denies lower extremity edema or waking. She has been using her nebulizer treatments at home however this has not helped with her shortness of breath. She denies recent travel history or sick contacts. She denies fever or chills at home.  Chest x-ray performed in the emergency room shows probable pneumonia. She has received nebulizer treatments for wheezing. She still is complaining of shortness of breath and is sitting up and looks very uncomfortable.  PAST MEDICAL HISTORY:   Past Medical History:  Diagnosis Date  . CHF (congestive heart failure) ([place])   . Diabetes mellitus without complication ([place])     PAST SURGICAL HISTORY:   Past Surgical History:  Procedure Laterality Date  . APPENDECTOMY    . CHOLECYSTECTOMY      SOCIAL HISTORY:   Social History  Substance Use Topics  . Smoking status: Current Every Day Smoker    Types: Cigarettes  . Smokeless tobacco: Never Used  . Alcohol use No    FAMILY HISTORY:  Mother with breast cancer and father with hypertension  DRUG ALLERGIES:   Allergies  Allergen Reactions  . Latex Anaphylaxis and Rash  . Gabapentin Nausea And Vomiting    REVIEW OF SYSTEMS:   Review of Systems  Constitutional: Negative for chills, fever and malaise/fatigue.   HENT: Negative.  Negative for ear discharge, ear pain, hearing loss, nosebleeds and sore throat.   Eyes: Negative.  Negative for blurred vision and pain.  Respiratory: Positive for cough, sputum production, shortness of breath and wheezing. Negative for hemoptysis.   Cardiovascular: Negative.  Negative for chest pain, palpitations and leg swelling.  Gastrointestinal: Negative.  Negative for abdominal pain, blood in stool, diarrhea, nausea and vomiting.  Genitourinary: Negative.  Negative for dysuria.  Musculoskeletal: Negative.  Negative for back pain.  Skin: Negative.   Neurological: Positive for weakness. Negative for dizziness, tremors, speech change, focal weakness, seizures and headaches.  Endo/Heme/Allergies: Negative.  Does not bruise/bleed easily.  Psychiatric/Behavioral: Negative.  Negative for depression, hallucinations and suicidal ideas.    MEDICATIONS AT HOME:   Prior to Admission medications   Medication Sig Start Date End Date Taking? Authorizing Provider  aspirin 81 MG chewable tablet Chew 81 mg by mouth every morning.   Yes Historical Provider, MD  carvedilol (COREG) 12.5 MG tablet Take 12.5 mg by mouth 2 (two) times daily. 08/05/15  Yes Historical Provider, MD  fluticasone (FLONASE) 50 MCG/ACT nasal spray Place 2 sprays into the nose daily as needed for rhinitis. 06/03/15  Yes Historical Provider, MD  furosemide (LASIX) 40 MG tablet Take 40 mg by mouth daily.  08/17/15 08/16/16 Yes Historical Provider, MD  hydrochlorothiazide (MICROZIDE) 12.5 MG capsule Take 12.5 mg by mouth every evening. 08/05/15  Yes Historical Provider, MD  insulin aspart (NOVOLOG) 100 UNIT/ML FlexPen Inject 12 Units into the skin See admin instructions.  Inject 12 units subcutaneously 3 times daily with meals. And as needed. 05/20/15 05/19/16 Yes Historical Provider, MD  insulin regular human CONCENTRATED (HUMULIN R) 500 UNIT/ML injection Inject 0.04 mLs (20 Units total) into the skin 2 (two) times daily with a  meal. Pt on sliding scale Patient taking differently: Inject 12 Units into the skin 2 (two) times daily. 12 units morning 12 units night 08/30/15  Yes Fritzi Mandes, MD  loratadine (CLARITIN) 10 MG tablet Take 10 mg by mouth at bedtime as needed for allergies. 05/19/15 05/18/16 Yes Historical Provider, MD  metFORMIN (GLUCOPHAGE) 1000 MG tablet Take 1,000 mg by mouth 2 (two) times daily. 07/30/14  Yes Historical Provider, MD  oxyCODONE-acetaminophen (PERCOCET/ROXICET) 5-325 MG tablet Take 1 tablet by mouth 3 (three) times daily as needed for pain. 07/23/15  Yes Historical Provider, MD  potassium chloride SA (K-DUR,KLOR-CON) 20 MEQ tablet Take 20 mEq by mouth daily.   Yes Historical Provider, MD  PROAIR HFA 108 [phone] Base) MCG/ACT inhaler Inhale 2 puffs into the lungs See admin instructions. Inhale 2 puffs by mouth every 4 to 6 hours as needed for shortness of breath/cough. 06/22/15  Yes Historical Provider, MD  ramipril (ALTACE) 10 MG capsule Take 10 mg by mouth at bedtime. 03/17/15 01/11/16 Yes Historical Provider, MD  SPIRIVA HANDIHALER 18 MCG inhalation capsule Take 18 mcg by mouth at bedtime.  06/23/15  Yes Historical Provider, MD  zolpidem (AMBIEN) 5 MG tablet Take 5 mg by mouth at bedtime as needed for sleep. 08/17/15 10/28/15 Yes Historical Provider, MD      VITAL SIGNS:  Blood pressure (!) 158/87, pulse 89, temperature 98.8 F (37.1 C), temperature source Oral, resp. rate 19, height 5' 5"  (1.651 m), weight 106.6 kg (235 lb), SpO2 93 %.  PHYSICAL EXAMINATION:   Physical Exam  Constitutional: She is oriented to person, place, and time. No distress.  Patient sitting upright no tripoding however she appears very uncomfortable  HENT:  Head: Normocephalic.  Eyes: No scleral icterus.  Neck: Normal range of motion. Neck supple. No JVD present. No tracheal deviation present.  Cardiovascular: Normal rate, regular rhythm and normal heart sounds.  Exam reveals no gallop and no friction rub.   No murmur  heard. Pulmonary/Chest: Breath sounds normal. She is in respiratory distress. She has no wheezes. She has no rales. She exhibits no tenderness.  Abdominal: Soft. Bowel sounds are normal. She exhibits no distension and no mass. There is no tenderness. There is no rebound and no guarding.  Musculoskeletal: Normal range of motion. She exhibits no edema.  Neurological: She is alert and oriented to person, place, and time.  Skin: Skin is warm. No rash noted. No erythema.  Psychiatric: Affect and judgment normal.      LABORATORY PANEL:   CBC  Recent Labs Lab 10/28/15 1250  WBC 14.6*  HGB 11.9*  HCT 35.2  PLT 136*   ------------------------------------------------------------------------------------------------------------------  Chemistries   Recent Labs Lab 10/28/15 1250  NA 136  K 3.5  CL 106  CO2 22  GLUCOSE 253*  BUN 17  CREATININE 1.18*  CALCIUM 8.8*  MG 1.6*   ------------------------------------------------------------------------------------------------------------------  Cardiac Enzymes  Recent Labs Lab 10/28/15 1250  TROPONINI <0.03   ------------------------------------------------------------------------------------------------------------------  RADIOLOGY:  Dg Chest 2 View  Result Date: 10/28/2015 CLINICAL DATA:  Difficulty breathing. EXAM: CHEST  2 VIEW COMPARISON:  08/29/2015. FINDINGS: Trachea is midline. Heart size normal. There maybe airspace disease in the left lower lobe. Lungs are otherwise clear. No pleural fluid. Flowing  anterior osteophytosis in the thoracic spine. IMPRESSION: Possible left lower lobe airspace opacification. Pneumonia cannot be excluded. Consider follow-up PA and lateral views of the chest in 3-4 weeks to ensure resolution, as clinically indicated, as malignancy cannot be definitively excluded. Electronically Signed   By: Lorin Picket M.D.   On: 10/28/2015 14:40    EKG:   Normal sinus rhythm no ST elevation or  depression. Of note EKG is reading atrial fibrillation however there are P waves  IMPRESSION AND PLAN:   60 year old female with a history of COPD with oxygen when necessary, hypertension and diabetes who presents with shortness of breath, cough and found to have pneumonia on x-ray.  1. HCAP: Patient was recently hospitalized for CHF PD exacerbation. Chest x-ray is showing left lower lobe pneumonia. Start cefepime. Follow up on blood and sputum culture. I will order CT chest to evaluate for underlying pulmonary emboli/pulmonary mass/pulmonary edema given patient's normal physical examination and the fact she looks uncomfortable breathing  2. COPD exacerbation: Apparently patient was having wheezing which has now cleared. Continue duo nebs and IV steroids. Continue inhalers 3. Uncontrolled diabetes with recent hemoglobin A1c over 12 Continue outpatient medications with the exception of metformin and obtain diabetes coordinator consult. Continue sliding scale insulin and ADA diet.  4. Essential hypertension: Continue Coreg, HCTZ and ramipril  5. Tobacco dependence: Patient highly encouraged to stop smoking. She quit in the past and I have discussed that she will be able to quit it may take time. Counseled for 3 minutes.  All the records are reviewed and case discussed with ED provider. Management plans discussed with the patient and she in agreement  CODE STATUS: full  TOTAL TIME TAKING CARE OF THIS PATIENT: 45 minutes.    America Sandall M.D on 10/28/2015 at 3:19 PM  Between 7am to 6pm - Pager - [phone]  After 6pm go to [email or web address] - password [email or web address]  Office  [phone]  CC: Primary care physician; [place]

## 2015-10-28 NOTE — Progress Notes (Signed)
Pharmacy Antibiotic Note  TRAYONNA BACHMEIER is a 60 y.o. female admitted on 10/28/2015 with HCAP .  Pharmacy has been consulted for Cefepime  dosing.  Plan: Will start Cefepime 2 g IV q8 hours.   Height: 5' 5"  (165.1 cm) Weight: 235 lb (106.6 kg) IBW/kg (Calculated) : 57  Temp (24hrs), Avg:98.8 F (37.1 C), Min:98.8 F (37.1 C), Max:98.8 F (37.1 C)   Recent Labs Lab 10/28/15 1250  WBC 14.6*  CREATININE 1.18*    Estimated Creatinine Clearance: 61.5 mL/min (by C-G formula based on SCr of 1.18 mg/dL (H)).    Allergies  Allergen Reactions  . Latex Anaphylaxis and Rash  . Gabapentin Nausea And Vomiting    Antimicrobials this admission:   Levaquin 10/11 >> 10/11  Dose adjustments this admission:   Microbiology results:  BCx:   UCx:    Sputum:    MRSA PCR:   Thank you for allowing pharmacy to be a part of this patient's care.  Nivaan Dicenzo D 10/28/2015 3:48 PM

## 2015-10-28 NOTE — ED Notes (Signed)
Gave pt graham crackers and peanut butter with sprite zero.

## 2015-10-28 NOTE — ED Provider Notes (Signed)
[facility] Emergency Department Provider Note    First MD Initiated Contact with Patient 10/28/15 1258     (approximate)  I have reviewed the triage vital signs and the nursing notes.   HISTORY  Chief Complaint Shortness of Breath    HPI [name] is a 60 y.o. female with 3 days of worsening shortness of breath and productive cough. Patient does have a history of congestive heart failure as well as diabetes as well as history of COPD and wears home O2 as needed. Denies any chills or chest pain. Denies any weight gain. Patient arrives in moderate respiratory distress severity And speaking in short phrases.   Past Medical History:  Diagnosis Date  . CHF (congestive heart failure) ([place])   . Diabetes mellitus without complication [facility])     Patient Active Problem List   Diagnosis Date Noted  . PNA (pneumonia) 10/28/2015  . COPD exacerbation ([place]) 08/29/2015    Past Surgical History:  Procedure Laterality Date  . APPENDECTOMY    . CHOLECYSTECTOMY      Prior to Admission medications   Medication Sig Start Date End Date Taking? Authorizing Provider  aspirin 81 MG chewable tablet Chew 81 mg by mouth every morning.   Yes Historical Provider, MD  carvedilol (COREG) 12.5 MG tablet Take 12.5 mg by mouth 2 (two) times daily. 08/05/15  Yes Historical Provider, MD  fluticasone (FLONASE) 50 MCG/ACT nasal spray Place 2 sprays into the nose daily as needed for rhinitis. 06/03/15  Yes Historical Provider, MD  furosemide (LASIX) 40 MG tablet Take 40 mg by mouth daily.  08/17/15 08/16/16 Yes Historical Provider, MD  hydrochlorothiazide (MICROZIDE) 12.5 MG capsule Take 12.5 mg by mouth every evening. 08/05/15  Yes Historical Provider, MD  insulin aspart (NOVOLOG) 100 UNIT/ML FlexPen Inject 12 Units into the skin See admin instructions. Inject 12 units subcutaneously 3 times daily with meals. And as needed. 05/20/15 05/19/16 Yes Historical Provider, MD  insulin regular  human CONCENTRATED (HUMULIN R) 500 UNIT/ML injection Inject 0.04 mLs (20 Units total) into the skin 2 (two) times daily with a meal. Pt on sliding scale Patient taking differently: Inject 12 Units into the skin 2 (two) times daily. 12 units morning 12 units night 08/30/15  Yes Fritzi Mandes, MD  loratadine (CLARITIN) 10 MG tablet Take 10 mg by mouth at bedtime as needed for allergies. 05/19/15 05/18/16 Yes Historical Provider, MD  metFORMIN (GLUCOPHAGE) 1000 MG tablet Take 1,000 mg by mouth 2 (two) times daily. 07/30/14  Yes Historical Provider, MD  oxyCODONE-acetaminophen (PERCOCET/ROXICET) 5-325 MG tablet Take 1 tablet by mouth 3 (three) times daily as needed for pain. 07/23/15  Yes Historical Provider, MD  potassium chloride SA (K-DUR,KLOR-CON) 20 MEQ tablet Take 20 mEq by mouth daily.   Yes Historical Provider, MD  PROAIR HFA 108 [phone] Base) MCG/ACT inhaler Inhale 2 puffs into the lungs See admin instructions. Inhale 2 puffs by mouth every 4 to 6 hours as needed for shortness of breath/cough. 06/22/15  Yes Historical Provider, MD  ramipril (ALTACE) 10 MG capsule Take 10 mg by mouth at bedtime. 03/17/15 01/11/16 Yes Historical Provider, MD  SPIRIVA HANDIHALER 18 MCG inhalation capsule Take 18 mcg by mouth at bedtime.  06/23/15  Yes Historical Provider, MD  zolpidem (AMBIEN) 5 MG tablet Take 5 mg by mouth at bedtime as needed for sleep. 08/17/15 10/28/15 Yes Historical Provider, MD    Allergies Latex and Gabapentin  No family history of bleeding disorders  Social  History Social History  Substance Use Topics  . Smoking status: Current Every Day Smoker    Types: Cigarettes  . Smokeless tobacco: Never Used  . Alcohol use No    Review of Systems Patient denies headaches, rhinorrhea, blurry vision, numbness, shortness of breath, chest pain, edema, cough, abdominal pain, nausea, vomiting, diarrhea, dysuria, fevers, rashes or hallucinations unless otherwise stated above in  HPI. ____________________________________________   PHYSICAL EXAM:  VITAL SIGNS: Vitals:   10/28/15 1330 10/28/15 1400  BP: (!) 146/67 (!) 158/87  Pulse: 90 89  Resp: 20 19  Temp:      Constitutional: Alert and oriented. Moderate respiratory distress Eyes: Conjunctivae are normal. PERRL. EOMI. Head: Atraumatic. Nose: No congestion/rhinnorhea. Mouth/Throat: Mucous membranes are moist.  Oropharynx non-erythematous. Neck: No stridor. Painless ROM. No cervical spine tenderness to palpation Hematological/Lymphatic/Immunilogical: No cervical lymphadenopathy. Cardiovascular: Normal rate, regular rhythm. Grossly normal heart sounds.  Good peripheral circulation. Respiratory: Tachypnea.  No retractions. Lungs with diffuse expiratory wheezing.  Patient speaking in very short phrases. Gastrointestinal: Soft and nontender. No distention. No abdominal bruits. No CVA tenderness. Genitourinary:  Musculoskeletal: No lower extremity tenderness nor edema.  No joint effusions. Neurologic:  Normal speech and language. No gross focal neurologic deficits are appreciated. No gait instability. Skin:  Skin is warm, dry and intact. No rash noted. Psychiatric: Mood and affect are normal. Speech and behavior are normal.  ____________________________________________   LABS (all labs ordered are listed, but only abnormal results are displayed)  Results for orders placed or performed during the hospital encounter of 10/28/15 (from the past 24 hour(s))  Basic metabolic panel     Status: Abnormal   Collection Time: 10/28/15 12:50 PM  Result Value Ref Range   Sodium 136 135 - 145 mmol/L   Potassium 3.5 3.5 - 5.1 mmol/L   Chloride 106 101 - 111 mmol/L   CO2 22 22 - 32 mmol/L   Glucose, Bld 253 (H) 65 - 99 mg/dL   BUN 17 6 - 20 mg/dL   Creatinine, Ser 1.18 (H) 0.44 - 1.00 mg/dL   Calcium 8.8 (L) 8.9 - 10.3 mg/dL   GFR calc non Af Amer 49 (L) >60 mL/min   GFR calc Af Amer 57 (L) >60 mL/min   Anion gap 8  5 - 15  CBC     Status: Abnormal   Collection Time: 10/28/15 12:50 PM  Result Value Ref Range   WBC 14.6 (H) 3.6 - 11.0 K/uL   RBC 4.23 3.80 - 5.20 MIL/uL   Hemoglobin 11.9 (L) 12.0 - 16.0 g/dL   HCT 35.2 35.0 - 47.0 %   MCV 83.3 80.0 - 100.0 fL   MCH 28.1 26.0 - 34.0 pg   MCHC 33.7 32.0 - 36.0 g/dL   RDW 14.7 (H) 11.5 - 14.5 %   Platelets 136 (L) 150 - 440 K/uL  Magnesium     Status: Abnormal   Collection Time: 10/28/15 12:50 PM  Result Value Ref Range   Magnesium 1.6 (L) 1.7 - 2.4 mg/dL  Troponin I     Status: None   Collection Time: 10/28/15 12:50 PM  Result Value Ref Range   Troponin I <0.03 <0.03 ng/mL   ____________________________________________  EKG My review and personal interpretation at Time: 12:53    Indication: sob  Rate: 90  Rhythm: sinus Axis: normal Other: no acute ischemic changes ____________________________________________  RADIOLOGY  I personally reviewed all radiographic images ordered to evaluate for the above acute complaints and reviewed radiology reports and  findings.  These findings were personally discussed with the patient.  Please see medical record for radiology report.  ____________________________________________   PROCEDURES  Procedure(s) performed: none    Critical Care performed: no ____________________________________________   INITIAL IMPRESSION / ASSESSMENT AND PLAN / ED COURSE  Pertinent labs & imaging results that were available during my care of the patient were reviewed by me and considered in my medical decision making (see chart for details).  DDX: Asthma, copd, CHF, pna, ptx, malignancy, Pe, anemia  ALICIANA RICCIARDI is a 60 y.o. who presents to the ED with Complaint of 3 days worsening shortness of breath. Presentation was consistent with congestive heart failure. Patient with exam findings more consistent with obstructive lung pathology. Multiple nebulizers ordered. Patient is artery received IV steroids. EKG  without signs of acute ischemia.  The patient will be placed on continuous pulse oximetry and telemetry for monitoring.  Laboratory evaluation will be sent to evaluate for the above complaints.     Clinical Course  Comment By Time  Patient given 3 albuterol treatments in addition to IV steroids as well as IV magnesium. Having some improvement in shortness of breath but still dyspneic and speaking in short phrases. Based on her symptoms I do feel the patient will require admission to hospital for further evaluation and management.  Have discussed with the patient and available family all diagnostics and treatments performed thus far and all questions were answered to the best of my ability. The patient demonstrates understanding and agreement with plan.  Merlyn Lot, MD 10/11 1454   ----------------------------------------- 3:21 PM on 10/28/2015 -----------------------------------------  Spoke with hospitalist regarding patient's presentation and given her failure to improve will further evaluate for possible PE with CT of the chest.  Have discussed with the patient and available family all diagnostics and treatments performed thus far and all questions were answered to the best of my ability. The patient demonstrates understanding and agreement with plan.   ____________________________________________   FINAL CLINICAL IMPRESSION(S) / ED DIAGNOSES  Final diagnoses:  Shortness of breath  Chronic obstructive pulmonary disease with acute exacerbation ([place])      NEW MEDICATIONS STARTED DURING THIS VISIT:  New Prescriptions   No medications on file     Note:  This document was prepared using Dragon voice recognition software and may include unintentional dictation errors.    Merlyn Lot, MD 10/28/15 [id number]

## 2015-10-28 NOTE — Progress Notes (Signed)
Notified Dr. Verdell Carmine that pt in room 215 had a blood sugar of 545 after 32 units on novolog insulin. Per MD place order for 1 time dose of 15 units of Novolog and and place order for 560m bolus at 9962mhr. MD will place order for Levemir

## 2015-10-28 NOTE — ED Triage Notes (Signed)
Pt comes into the ED via EMS from home c/o shortness of breath that started 3 days ago.  Patient has h/o COPD and wears oxygen at home as needed.  Patient has bilateral wheezing in upper lobes per EMS and given 125 solumedrol and 1 duoneb.  Patient VS WDL.

## 2015-10-29 LAB — CBC
HEMATOCRIT: 38.4 % (ref 35.0–47.0)
HEMOGLOBIN: 12.4 g/dL (ref 12.0–16.0)
MCH: 27.6 pg (ref 26.0–34.0)
MCHC: 32.4 g/dL (ref 32.0–36.0)
MCV: 85.3 fL (ref 80.0–100.0)
Platelets: 140 10*3/uL — ABNORMAL LOW (ref 150–440)
RBC: 4.5 MIL/uL (ref 3.80–5.20)
RDW: 14.2 % (ref 11.5–14.5)
WBC: 14.5 10*3/uL — ABNORMAL HIGH (ref 3.6–11.0)

## 2015-10-29 LAB — BASIC METABOLIC PANEL
Anion gap: 10 (ref 5–15)
BUN: 25 mg/dL — AB (ref 6–20)
CHLORIDE: 102 mmol/L (ref 101–111)
CO2: 20 mmol/L — AB (ref 22–32)
CREATININE: 1.37 mg/dL — AB (ref 0.44–1.00)
Calcium: 8.6 mg/dL — ABNORMAL LOW (ref 8.9–10.3)
GFR calc Af Amer: 48 mL/min — ABNORMAL LOW (ref >60)
GFR calc non Af Amer: 41 mL/min — ABNORMAL LOW (ref >60)
GLUCOSE: 409 mg/dL — AB (ref 65–99)
Potassium: 4.2 mmol/L (ref 3.5–5.1)
SODIUM: 132 mmol/L — AB (ref 135–145)

## 2015-10-29 LAB — GLUCOSE, CAPILLARY
GLUCOSE-CAPILLARY: 378 mg/dL — AB (ref 65–99)
GLUCOSE-CAPILLARY: 306 mg/dL — AB (ref 65–99)
Glucose-Capillary: 364 mg/dL — ABNORMAL HIGH (ref 65–99)
Glucose-Capillary: 372 mg/dL — ABNORMAL HIGH (ref 65–99)
Glucose-Capillary: 389 mg/dL — ABNORMAL HIGH (ref 65–99)

## 2015-10-29 LAB — ECHOCARDIOGRAM COMPLETE
Height: 65 in
WEIGHTICAEL: 3760 [oz_av]

## 2015-10-29 LAB — MAGNESIUM: MAGNESIUM: 1.9 mg/dL (ref 1.7–2.4)

## 2015-10-29 MED ORDER — INSULIN ASPART 100 UNIT/ML ~~LOC~~ SOLN
10.0000 [IU] | Freq: Once | SUBCUTANEOUS | Status: AC
  Administered 2015-10-29: 10 [IU] via SUBCUTANEOUS
  Filled 2015-10-29: qty 10

## 2015-10-29 MED ORDER — METFORMIN HCL 500 MG PO TABS
1000.0000 mg | ORAL_TABLET | Freq: Two times a day (BID) | ORAL | Status: DC
  Administered 2015-10-29: 1000 mg via ORAL
  Filled 2015-10-29: qty 2

## 2015-10-29 MED ORDER — INSULIN ASPART 100 UNIT/ML ~~LOC~~ SOLN
12.0000 [IU] | Freq: Once | SUBCUTANEOUS | Status: AC
  Administered 2015-10-29: 12 [IU] via SUBCUTANEOUS
  Filled 2015-10-29: qty 12

## 2015-10-29 MED ORDER — LEVOFLOXACIN 750 MG PO TABS: 750.0000 mg | ORAL_TABLET | Freq: Every day | ORAL | 0 refills | Status: DC

## 2015-10-29 MED ORDER — INSULIN ASPART 100 UNIT/ML ~~LOC~~ SOLN
14.0000 [IU] | Freq: Three times a day (TID) | SUBCUTANEOUS | Status: DC
  Administered 2015-10-29 (×2): 14 [IU] via SUBCUTANEOUS
  Filled 2015-10-29 (×2): qty 14

## 2015-10-29 MED ORDER — INSULIN DETEMIR 100 UNIT/ML ~~LOC~~ SOLN
24.0000 [IU] | Freq: Every day | SUBCUTANEOUS | Status: DC
  Filled 2015-10-29: qty 0.24

## 2015-10-29 MED ORDER — IPRATROPIUM-ALBUTEROL 0.5-2.5 (3) MG/3ML IN SOLN
3.0000 mL | Freq: Three times a day (TID) | RESPIRATORY_TRACT | Status: DC
  Filled 2015-10-29: qty 3

## 2015-10-29 MED ORDER — INSULIN ASPART 100 UNIT/ML FLEXPEN: 14.0000 [IU] | PEN_INJECTOR | SUBCUTANEOUS | 11 refills | Status: DC

## 2015-10-29 NOTE — Progress Notes (Signed)
CBG 425. 12 units novolog ordered per Dr. Ara Kussmaul. Will continue to assess pt.

## 2015-10-29 NOTE — Progress Notes (Signed)
CBG 389. An additional 10 units novolog ordered per Dr. Ara Kussmaul. Will continue to assess.

## 2015-10-29 NOTE — Progress Notes (Addendum)
Inpatient Diabetes Program Recommendations  AACE/ADA: New Consensus Statement on Inpatient Glycemic Control (2015)  Target Ranges:  Prepandial:   less than 140 mg/dL      Peak postprandial:   less than 180 mg/dL (1-2 hours)      Critically ill patients:  140 - 180 mg/dL   Lab Results  Component Value Date   GLUCAP 378 (H) 10/29/2015   HGBA1C 12.7 (H) 08/29/2015    Review of Glycemic Control:  Results for RENU, ASBY (MRN [id number]) as of 10/29/2015 09:51  Ref. Range 10/28/2015 21:20 10/28/2015 23:57 10/29/2015 01:31 10/29/2015 05:54 10/29/2015 07:25  Glucose-Capillary Latest Ref Range: 65 - 99 mg/dL 425 (H) 389 (H) 364 (H) 372 (H) 378 (H)    Diabetes history: Type 2 diabetes Outpatient Diabetes medications: U500 insulin 12 units bid (note this equals 60 units bid of U100 insulin), Novolog 12 units tid with meals Current orders for Inpatient glycemic control:  Novolog resistant tid with meals and HS, Novolog 14 units tid with meals, Levemir 24 units daily at [facility]  Inpatient Diabetes Program Recommendations:     Note blood sugars increased, likely from IV solumedrol.  Patient is no longer on steroids per MAR. Note that patient was taking 12 units bid U500 insulin at home prior to admit (which totals 120 units of U100 insulin).  Please consider increasing Levemir to 30 units bid.  Note A1C ins August was high.  Will see patient.   Thanks, Adah Perl, RN, BC-ADM Inpatient Diabetes Coordinator Pager [phone] (8a-5p)    10:45- Spoke with patient regarding A1C and insulins.  She states she has MD whom she see's regularly.  Discussed that U500 can be given bid to cover food and blood sugars.  She is currently using vial and syringe to administer.  Discussed use of insulin pen for U500 which may make medication more convenient to administer. Also suggested that she talk with her PCP regarding just being on U500 without Novolog? She was appreciative of information and states that  convenience is important to her.  Adah Perl, RN, BC-ADM Inpatient Diabetes Coordinator Pager [phone]

## 2015-10-29 NOTE — Discharge Summary (Signed)
[facility] NAME: [name]    MR#:  [id number]  DATE OF BIRTH:  06/24/1955  DATE OF ADMISSION:  10/28/2015 ADMITTING PHYSICIAN: Bettey Costa, MD  DATE OF DISCHARGE: 10/29/2015  PRIMARY CARE PHYSICIAN: [facility]    ADMISSION DIAGNOSIS:  Shortness of breath [R06.02] Chronic obstructive pulmonary disease with acute exacerbation (HCC) [J44.1]  DISCHARGE DIAGNOSIS:  Active Problems:   PNA (pneumonia)   SECONDARY DIAGNOSIS:   Past Medical History:  Diagnosis Date  . CHF (congestive heart failure) ([place])   . Diabetes mellitus without complication [facility])     HOSPITAL COURSE:   60 year old female with history of COPD who presented with shortness of breath and cough and found to have pneumonia.  1. HCAP: Patient was treated on cefepime. Her symptoms have much improved. She will be discharged on Levaquin.  2. COPD exacerbation: Patient no longer has wheezing. Due to the very high blood sugars and at baseline uncontrolled diabetes with hemoglobin A1c of 12, at this point I do not think she needs further steroid treatment.  3. Uncontrolled diabetes with a hemoglobin A1c over 12: Patient will continue outpatient medications however she needs close follow-up with her primary care physician. She was asked to write her blood sugars every before meals and daily at bedtime. She was evaluated diabetes coordinator.  4. Essential hypertension: Patient may continue Coreg, HCTZ and ramipril  DISCHARGE CONDITIONS AND DIET:  Stable Cardac diet  CONSULTS OBTAINED:    DRUG ALLERGIES:   Allergies  Allergen Reactions  . Latex Anaphylaxis and Rash  . Gabapentin Nausea And Vomiting    DISCHARGE MEDICATIONS:   Current Discharge Medication List    START taking these medications   Details  levofloxacin (LEVAQUIN) 750 MG tablet Take 1 tablet (750 mg total) by mouth daily. Qty: 5 tablet, Refills: 0      CONTINUE  these medications which have CHANGED   Details  insulin aspart (NOVOLOG) 100 UNIT/ML FlexPen Inject 14 Units into the skin See admin instructions. Inject 12 units subcutaneously 3 times daily with meals. And as needed. Qty: 15 mL, Refills: 11      CONTINUE these medications which have NOT CHANGED   Details  aspirin 81 MG chewable tablet Chew 81 mg by mouth every morning.    carvedilol (COREG) 12.5 MG tablet Take 12.5 mg by mouth 2 (two) times daily.    fluticasone (FLONASE) 50 MCG/ACT nasal spray Place 2 sprays into the nose daily as needed for rhinitis.    furosemide (LASIX) 40 MG tablet Take 40 mg by mouth daily.     hydrochlorothiazide (MICROZIDE) 12.5 MG capsule Take 12.5 mg by mouth every evening.    insulin regular human CONCENTRATED (HUMULIN R) 500 UNIT/ML injection Inject 0.04 mLs (20 Units total) into the skin 2 (two) times daily with a meal. Pt on sliding scale Qty: 20 mL, Refills: 0    loratadine (CLARITIN) 10 MG tablet Take 10 mg by mouth at bedtime as needed for allergies.    metFORMIN (GLUCOPHAGE) 1000 MG tablet Take 1,000 mg by mouth 2 (two) times daily.    oxyCODONE-acetaminophen (PERCOCET/ROXICET) 5-325 MG tablet Take 1 tablet by mouth 3 (three) times daily as needed for pain.    potassium chloride SA (K-DUR,KLOR-CON) 20 MEQ tablet Take 20 mEq by mouth daily.    PROAIR HFA 108 (90 Base) MCG/ACT inhaler Inhale 2 puffs into the lungs See admin instructions. Inhale 2 puffs by mouth every  4 to 6 hours as needed for shortness of breath/cough. Refills: 5    ramipril (ALTACE) 10 MG capsule Take 10 mg by mouth at bedtime.    SPIRIVA HANDIHALER 18 MCG inhalation capsule Take 18 mcg by mouth at bedtime.  Refills: 5    zolpidem (AMBIEN) 5 MG tablet Take 5 mg by mouth at bedtime as needed for sleep.              Today   CHIEF COMPLAINT:  Patient doing very well today. She is ready for discharge.   VITAL SIGNS:  Blood pressure (!) 137/50, pulse 88,  temperature 98.2 F (36.8 C), temperature source Oral, resp. rate 18, height 5' 5"  (1.651 m), weight 106.6 kg (235 lb), SpO2 98 %.   REVIEW OF SYSTEMS:  Review of Systems  Constitutional: Negative.  Negative for chills, fever and malaise/fatigue.  HENT: Negative.  Negative for ear discharge, ear pain, hearing loss, nosebleeds and sore throat.   Eyes: Negative.  Negative for blurred vision and pain.  Respiratory: Negative.  Negative for cough, hemoptysis, shortness of breath and wheezing.   Cardiovascular: Negative.  Negative for chest pain, palpitations and leg swelling.  Gastrointestinal: Negative.  Negative for abdominal pain, blood in stool, diarrhea, nausea and vomiting.  Genitourinary: Negative.  Negative for dysuria.  Musculoskeletal: Negative.  Negative for back pain.  Skin: Negative.   Neurological: Negative for dizziness, tremors, speech change, focal weakness, seizures and headaches.  Endo/Heme/Allergies: Negative.  Does not bruise/bleed easily.  Psychiatric/Behavioral: Negative.  Negative for depression, hallucinations and suicidal ideas.     PHYSICAL EXAMINATION:  GENERAL:  60 y.o.-year-old patient lying in the bed with no acute distress.  NECK:  Supple, no jugular venous distention. No thyroid enlargement, no tenderness.  LUNGS: Normal breath sounds bilaterally, no wheezing, rales,rhonchi  No use of accessory muscles of respiration.  CARDIOVASCULAR: S1, S2 normal. No murmurs, rubs, or gallops.  ABDOMEN: Soft, non-tender, non-distended. Bowel sounds present. No organomegaly or mass.  EXTREMITIES: No pedal edema, cyanosis, or clubbing.  PSYCHIATRIC: The patient is alert and oriented x 3.  SKIN: No obvious rash, lesion, or ulcer.   DATA REVIEW:   CBC  Recent Labs Lab 10/29/15 0421  WBC 14.5*  HGB 12.4  HCT 38.4  PLT 140*    Chemistries   Recent Labs Lab 10/29/15 0421  NA 132*  K 4.2  CL 102  CO2 20*  GLUCOSE 409*  BUN 25*  CREATININE 1.37*  CALCIUM  8.6*  MG 1.9    Cardiac Enzymes  Recent Labs Lab 10/28/15 1250  TROPONINI <0.03    Microbiology Results  @[email or web address] @  RADIOLOGY:  Dg Chest 2 View  Result Date: 10/28/2015 CLINICAL DATA:  Difficulty breathing. EXAM: CHEST  2 VIEW COMPARISON:  08/29/2015. FINDINGS: Trachea is midline. Heart size normal. There maybe airspace disease in the left lower lobe. Lungs are otherwise clear. No pleural fluid. Flowing anterior osteophytosis in the thoracic spine. IMPRESSION: Possible left lower lobe airspace opacification. Pneumonia cannot be excluded. Consider follow-up PA and lateral views of the chest in 3-4 weeks to ensure resolution, as clinically indicated, as malignancy cannot be definitively excluded. Electronically Signed   By: Lorin Picket M.D.   On: 10/28/2015 14:40   Ct Angio Chest Pe W And/or Wo Contrast  Result Date: 10/28/2015 CLINICAL DATA:  Increasing shortness of breath and productive cough EXAM: CT ANGIOGRAPHY CHEST WITH CONTRAST TECHNIQUE: Multidetector CT imaging of the chest was performed using the standard protocol during bolus administration  of intravenous contrast. Multiplanar CT image reconstructions and MIPs were obtained to evaluate the vascular anatomy. CONTRAST:  75 mL Isovue 370. COMPARISON:  Plain film from earlier in the same day. FINDINGS: Cardiovascular: Aortic calcifications are noted without aneurysmal dilatation or dissection. Coronary calcifications are noted. The pulmonary artery is well visualized and demonstrates a normal branching pattern. No filling defects to suggest pulmonary emboli are noted. Mediastinum/Nodes: Thoracic inlet is within normal limits. Scattered mediastinal lymph nodes are noted which are not significant by size criteria. Multiple small hilar lymph nodes are noted as well. Lungs/Pleura: Lungs are clear. No pleural effusion or pneumothorax. Upper Abdomen: No acute abnormality. Musculoskeletal: Degenerative changes of the thoracic spine are  noted. Review of the MIP images confirms the above findings. IMPRESSION: No evidence of pulmonary emboli. No acute abnormality seen. Electronically Signed   By: Inez Catalina M.D.   On: 10/28/2015 19:03      Management plans discussed with the patient and she is in agreement. Stable for discharge home  Patient should follow up with pcp  Rounded with nursing staff today. CODE STATUS:     Code Status Orders        Start     Ordered   10/28/15 1641  Full code  Continuous     10/28/15 1640    Code Status History    Date Active Date Inactive Code Status Order ID Comments User Context   08/29/2015  2:38 PM 08/30/2015  5:50 PM Full Code [id number]  Harvie Bridge, DO Inpatient      TOTAL TIME TAKING CARE OF THIS PATIENT: 35 minutes.    Note: This dictation was prepared with Dragon dictation along with smaller phrase technology. Any transcriptional errors that result from this process are unintentional.  Brogan Martis M.D on 10/29/2015 at 12:07 PM  Between 7am to 6pm - Pager - [phone] After 6pm go to [email or web address] - password [email or web address] [facility]  Office  [phone]  CC: Primary care physician; [place]

## 2015-10-31 LAB — CULTURE, RESPIRATORY

## 2015-10-31 LAB — CULTURE, RESPIRATORY W GRAM STAIN: Culture: NORMAL

## 2015-12-04 ENCOUNTER — Emergency Department: Payer: Medicaid Other

## 2015-12-04 ENCOUNTER — Inpatient Hospital Stay
Admission: EM | Admit: 2015-12-04 | Discharge: 2015-12-07 | DRG: 291 | Disposition: A | Discharge status: Home / Self Care | Payer: Medicaid Other | Attending: Internal Medicine | Admitting: Internal Medicine

## 2015-12-04 ENCOUNTER — Encounter: Payer: Self-pay | Admitting: Emergency Medicine

## 2015-12-04 DIAGNOSIS — Z794 Long term (current) use of insulin: Secondary | ICD-10-CM | POA: Diagnosis not present

## 2015-12-04 DIAGNOSIS — E1165 Type 2 diabetes mellitus with hyperglycemia: Secondary | ICD-10-CM | POA: Diagnosis present

## 2015-12-04 DIAGNOSIS — J9602 Acute respiratory failure with hypercapnia: Secondary | ICD-10-CM | POA: Diagnosis present

## 2015-12-04 DIAGNOSIS — J449 Chronic obstructive pulmonary disease, unspecified: Secondary | ICD-10-CM | POA: Diagnosis present

## 2015-12-04 DIAGNOSIS — Z79891 Long term (current) use of opiate analgesic: Secondary | ICD-10-CM

## 2015-12-04 DIAGNOSIS — F17210 Nicotine dependence, cigarettes, uncomplicated: Secondary | ICD-10-CM | POA: Diagnosis present

## 2015-12-04 DIAGNOSIS — I130 Hypertensive heart and chronic kidney disease with heart failure and stage 1 through stage 4 chronic kidney disease, or unspecified chronic kidney disease: Principal | ICD-10-CM | POA: Diagnosis present

## 2015-12-04 DIAGNOSIS — R109 Unspecified abdominal pain: Secondary | ICD-10-CM

## 2015-12-04 DIAGNOSIS — Z9049 Acquired absence of other specified parts of digestive tract: Secondary | ICD-10-CM

## 2015-12-04 DIAGNOSIS — Z7982 Long term (current) use of aspirin: Secondary | ICD-10-CM

## 2015-12-04 DIAGNOSIS — R0603 Acute respiratory distress: Secondary | ICD-10-CM

## 2015-12-04 DIAGNOSIS — N179 Acute kidney failure, unspecified: Secondary | ICD-10-CM | POA: Diagnosis present

## 2015-12-04 DIAGNOSIS — I5033 Acute on chronic diastolic (congestive) heart failure: Secondary | ICD-10-CM | POA: Diagnosis present

## 2015-12-04 DIAGNOSIS — Z7951 Long term (current) use of inhaled steroids: Secondary | ICD-10-CM

## 2015-12-04 DIAGNOSIS — R2681 Unsteadiness on feet: Secondary | ICD-10-CM

## 2015-12-04 DIAGNOSIS — J810 Acute pulmonary edema: Secondary | ICD-10-CM | POA: Diagnosis not present

## 2015-12-04 DIAGNOSIS — N183 Chronic kidney disease, stage 3 (moderate): Secondary | ICD-10-CM | POA: Diagnosis present

## 2015-12-04 DIAGNOSIS — M6281 Muscle weakness (generalized): Secondary | ICD-10-CM

## 2015-12-04 DIAGNOSIS — E1101 Type 2 diabetes mellitus with hyperosmolarity with coma: Secondary | ICD-10-CM

## 2015-12-04 DIAGNOSIS — R0902 Hypoxemia: Secondary | ICD-10-CM

## 2015-12-04 DIAGNOSIS — E1122 Type 2 diabetes mellitus with diabetic chronic kidney disease: Secondary | ICD-10-CM | POA: Diagnosis present

## 2015-12-04 DIAGNOSIS — J9690 Respiratory failure, unspecified, unspecified whether with hypoxia or hypercapnia: Secondary | ICD-10-CM

## 2015-12-04 DIAGNOSIS — I509 Heart failure, unspecified: Secondary | ICD-10-CM

## 2015-12-04 DIAGNOSIS — J9601 Acute respiratory failure with hypoxia: Secondary | ICD-10-CM | POA: Diagnosis present

## 2015-12-04 DIAGNOSIS — R0600 Dyspnea, unspecified: Secondary | ICD-10-CM | POA: Diagnosis present

## 2015-12-04 LAB — URINE DRUG SCREEN, QUALITATIVE (ARMC ONLY)
AMPHETAMINES, UR SCREEN: NOT DETECTED
Barbiturates, Ur Screen: NOT DETECTED
Benzodiazepine, Ur Scrn: NOT DETECTED
COCAINE METABOLITE, UR ~~LOC~~: NOT DETECTED
Cannabinoid 50 Ng, Ur ~~LOC~~: NOT DETECTED
MDMA (ECSTASY) UR SCREEN: NOT DETECTED
METHADONE SCREEN, URINE: NOT DETECTED
OPIATE, UR SCREEN: NOT DETECTED
Phencyclidine (PCP) Ur S: NOT DETECTED
Tricyclic, Ur Screen: NOT DETECTED

## 2015-12-04 LAB — BLOOD GAS, ARTERIAL
ACID-BASE DEFICIT: 7.8 mmol/L — AB (ref 0.0–2.0)
BICARBONATE: 20.4 mmol/L (ref 20.0–28.0)
Delivery systems: POSITIVE
Expiratory PAP: 6
FIO2: 1
Inspiratory PAP: 14
MECHANICAL RATE: 8
O2 Saturation: 89.9 %
PATIENT TEMPERATURE: 37
PH ART: 7.21 — AB (ref 7.350–7.450)
pCO2 arterial: 51 mmHg — ABNORMAL HIGH (ref 32.0–48.0)
pO2, Arterial: 71 mmHg — ABNORMAL LOW (ref 83.0–108.0)

## 2015-12-04 LAB — CBC WITH DIFFERENTIAL/PLATELET
BASOS PCT: 1 %
Basophils Absolute: 0.2 10*3/uL — ABNORMAL HIGH (ref 0–0.1)
Eosinophils Absolute: 0.2 10*3/uL (ref 0–0.7)
Eosinophils Relative: 2 %
HEMATOCRIT: 45.1 % (ref 35.0–47.0)
Hemoglobin: 14.5 g/dL (ref 12.0–16.0)
LYMPHS PCT: 27 %
Lymphs Abs: 3.8 10*3/uL — ABNORMAL HIGH (ref 1.0–3.6)
MCH: 27.5 pg (ref 26.0–34.0)
MCHC: 32.1 g/dL (ref 32.0–36.0)
MCV: 85.6 fL (ref 80.0–100.0)
MONO ABS: 0.9 10*3/uL (ref 0.2–0.9)
MONOS PCT: 7 %
Neutro Abs: 8.9 10*3/uL — ABNORMAL HIGH (ref 1.4–6.5)
Neutrophils Relative %: 63 %
Platelets: 220 10*3/uL (ref 150–440)
RBC: 5.27 MIL/uL — ABNORMAL HIGH (ref 3.80–5.20)
RDW: 15 % — AB (ref 11.5–14.5)
WBC: 14 10*3/uL — ABNORMAL HIGH (ref 3.6–11.0)

## 2015-12-04 LAB — GLUCOSE, CAPILLARY
GLUCOSE-CAPILLARY: 485 mg/dL — AB (ref 65–99)
Glucose-Capillary: 496 mg/dL — ABNORMAL HIGH (ref 65–99)
Glucose-Capillary: 505 mg/dL (ref 65–99)
Glucose-Capillary: 394 mg/dL — ABNORMAL HIGH (ref 65–99)
Glucose-Capillary: 337 mg/dL — ABNORMAL HIGH (ref 65–99)
Glucose-Capillary: 180 mg/dL — ABNORMAL HIGH (ref 65–99)

## 2015-12-04 LAB — COMPREHENSIVE METABOLIC PANEL
ALT: 15 U/L (ref 14–54)
ANION GAP: 13 (ref 5–15)
AST: 39 U/L (ref 15–41)
Albumin: 3.3 g/dL — ABNORMAL LOW (ref 3.5–5.0)
Alkaline Phosphatase: 144 U/L — ABNORMAL HIGH (ref 38–126)
BILIRUBIN TOTAL: 0.6 mg/dL (ref 0.3–1.2)
BUN: 16 mg/dL (ref 6–20)
CO2: 20 mmol/L — ABNORMAL LOW (ref 22–32)
Calcium: 8.6 mg/dL — ABNORMAL LOW (ref 8.9–10.3)
Chloride: 99 mmol/L — ABNORMAL LOW (ref 101–111)
Creatinine, Ser: 1.52 mg/dL — ABNORMAL HIGH (ref 0.44–1.00)
GFR, EST AFRICAN AMERICAN: 42 mL/min — AB (ref >60)
GFR, EST NON AFRICAN AMERICAN: 36 mL/min — AB (ref >60)
Glucose, Bld: 633 mg/dL (ref 65–99)
POTASSIUM: 4.1 mmol/L (ref 3.5–5.1)
Sodium: 132 mmol/L — ABNORMAL LOW (ref 135–145)
TOTAL PROTEIN: 8.8 g/dL — AB (ref 6.5–8.1)

## 2015-12-04 LAB — URINALYSIS COMPLETE WITH MICROSCOPIC (ARMC ONLY)
BILIRUBIN URINE: NEGATIVE
Bacteria, UA: NONE SEEN
Ketones, ur: NEGATIVE mg/dL
LEUKOCYTES UA: NEGATIVE
Nitrite: NEGATIVE
Protein, ur: 100 mg/dL — AB
Specific Gravity, Urine: 1.016 (ref 1.005–1.030)
pH: 6 (ref 5.0–8.0)

## 2015-12-04 LAB — CREATININE, SERUM
Creatinine, Ser: 1.74 mg/dL — ABNORMAL HIGH (ref 0.44–1.00)
GFR calc Af Amer: 35 mL/min — ABNORMAL LOW (ref >60)
GFR calc non Af Amer: 30 mL/min — ABNORMAL LOW (ref >60)

## 2015-12-04 LAB — CBC
HEMATOCRIT: 42.1 % (ref 35.0–47.0)
HEMOGLOBIN: 13.7 g/dL (ref 12.0–16.0)
MCH: 27.3 pg (ref 26.0–34.0)
MCHC: 32.6 g/dL (ref 32.0–36.0)
MCV: 83.9 fL (ref 80.0–100.0)
Platelets: 173 10*3/uL (ref 150–440)
RBC: 5.01 MIL/uL (ref 3.80–5.20)
RDW: 15.1 % — ABNORMAL HIGH (ref 11.5–14.5)
WBC: 17.4 10*3/uL — ABNORMAL HIGH (ref 3.6–11.0)

## 2015-12-04 LAB — TROPONIN I
TROPONIN I: 0.04 ng/mL — AB (ref <0.03)
Troponin I: 0.1 ng/mL (ref <0.03)

## 2015-12-04 LAB — MRSA PCR SCREENING: MRSA BY PCR: POSITIVE — AB

## 2015-12-04 MED ORDER — ONDANSETRON HCL 4 MG/2ML IJ SOLN
4.0000 mg | Freq: Once | INTRAMUSCULAR | Status: AC
  Administered 2015-12-04: 4 mg via INTRAVENOUS

## 2015-12-04 MED ORDER — SODIUM CHLORIDE 0.9 % IV SOLN: 250.0000 mL | INTRAVENOUS | Status: DC | PRN

## 2015-12-04 MED ORDER — MUPIROCIN 2 % EX OINT
1.0000 "application " | TOPICAL_OINTMENT | Freq: Two times a day (BID) | CUTANEOUS | Status: DC
  Administered 2015-12-04 – 2015-12-05 (×4): 1 via NASAL
  Filled 2015-12-04 (×2): qty 22

## 2015-12-04 MED ORDER — FUROSEMIDE 10 MG/ML IJ SOLN
40.0000 mg | Freq: Once | INTRAMUSCULAR | Status: AC
  Administered 2015-12-04: 40 mg via INTRAVENOUS
  Filled 2015-12-04: qty 4

## 2015-12-04 MED ORDER — CHLORHEXIDINE GLUCONATE CLOTH 2 % EX PADS
6.0000 | MEDICATED_PAD | Freq: Every day | CUTANEOUS | Status: DC
  Administered 2015-12-05 – 2015-12-07 (×3): 6 via TOPICAL

## 2015-12-04 MED ORDER — INSULIN ASPART 100 UNIT/ML ~~LOC~~ SOLN
12.0000 [IU] | Freq: Once | SUBCUTANEOUS | Status: AC
  Administered 2015-12-04: 12 [IU] via SUBCUTANEOUS
  Filled 2015-12-04: qty 12

## 2015-12-04 MED ORDER — OXYCODONE-ACETAMINOPHEN 5-325 MG PO TABS
1.0000 | ORAL_TABLET | ORAL | Status: DC | PRN
  Administered 2015-12-04 – 2015-12-07 (×7): 1 via ORAL
  Filled 2015-12-04 (×7): qty 1

## 2015-12-04 MED ORDER — HEPARIN SODIUM (PORCINE) 5000 UNIT/ML IJ SOLN
5000.0000 [IU] | Freq: Three times a day (TID) | INTRAMUSCULAR | Status: DC
  Administered 2015-12-04 – 2015-12-05 (×4): 5000 [IU] via SUBCUTANEOUS
  Filled 2015-12-04 (×4): qty 1

## 2015-12-04 MED ORDER — INSULIN ASPART 100 UNIT/ML ~~LOC~~ SOLN: 2.0000 [IU] | SUBCUTANEOUS | Status: DC

## 2015-12-04 MED ORDER — ONDANSETRON HCL 4 MG/2ML IJ SOLN
INTRAMUSCULAR | Status: AC
  Administered 2015-12-04: 4 mg via INTRAVENOUS
  Filled 2015-12-04: qty 2

## 2015-12-04 MED ORDER — INSULIN GLARGINE 100 UNIT/ML ~~LOC~~ SOLN
40.0000 [IU] | Freq: Every day | SUBCUTANEOUS | Status: DC
  Administered 2015-12-04 – 2015-12-05 (×2): 40 [IU] via SUBCUTANEOUS
  Filled 2015-12-04 (×2): qty 0.4

## 2015-12-04 MED ORDER — IPRATROPIUM-ALBUTEROL 0.5-2.5 (3) MG/3ML IN SOLN
3.0000 mL | RESPIRATORY_TRACT | Status: DC | PRN
  Administered 2015-12-04: 3 mL via RESPIRATORY_TRACT
  Filled 2015-12-04: qty 3

## 2015-12-04 MED ORDER — INSULIN ASPART 100 UNIT/ML ~~LOC~~ SOLN
4.0000 [IU] | Freq: Three times a day (TID) | SUBCUTANEOUS | Status: DC
  Administered 2015-12-04 – 2015-12-05 (×4): 4 [IU] via SUBCUTANEOUS
  Filled 2015-12-04 (×4): qty 4

## 2015-12-04 MED ORDER — TIOTROPIUM BROMIDE MONOHYDRATE 18 MCG IN CAPS
18.0000 ug | ORAL_CAPSULE | Freq: Every morning | RESPIRATORY_TRACT | Status: DC
  Administered 2015-12-04 – 2015-12-07 (×4): 18 ug via RESPIRATORY_TRACT
  Filled 2015-12-04: qty 5

## 2015-12-04 MED ORDER — CARVEDILOL 12.5 MG PO TABS
12.5000 mg | ORAL_TABLET | Freq: Two times a day (BID) | ORAL | Status: DC
  Administered 2015-12-04 – 2015-12-07 (×7): 12.5 mg via ORAL
  Filled 2015-12-04: qty 1
  Filled 2015-12-04: qty 2
  Filled 2015-12-04: qty 1
  Filled 2015-12-04: qty 2
  Filled 2015-12-04: qty 1
  Filled 2015-12-04: qty 2
  Filled 2015-12-04: qty 1

## 2015-12-04 MED ORDER — INSULIN ASPART 100 UNIT/ML ~~LOC~~ SOLN
0.0000 [IU] | Freq: Three times a day (TID) | SUBCUTANEOUS | Status: DC
  Administered 2015-12-04: 15 [IU] via SUBCUTANEOUS
  Administered 2015-12-04 – 2015-12-05 (×2): 20 [IU] via SUBCUTANEOUS
  Administered 2015-12-05: 15 [IU] via SUBCUTANEOUS
  Filled 2015-12-04: qty 15
  Filled 2015-12-04 (×2): qty 20
  Filled 2015-12-04: qty 15

## 2015-12-04 MED ORDER — RAMIPRIL 10 MG PO CAPS: 10.0000 mg | ORAL_CAPSULE | Freq: Every day | ORAL | Status: DC

## 2015-12-04 MED ORDER — LIVING WELL WITH DIABETES BOOK
Freq: Once | Status: AC
  Administered 2015-12-04: 16:00:00
  Filled 2015-12-04: qty 1

## 2015-12-04 NOTE — ED Notes (Signed)
Prior to insertion of folley, peri care was performed with help from [employer]. Pt has large amount of stool. After peri care, line was changed and pt repositioned.

## 2015-12-04 NOTE — H&P (Signed)
PULMONARY / CRITICAL CARE MEDICINE   Name: [name] MRN: [id number] DOB: 16-Feb-1955    ADMISSION DATE:  12/04/2015 CONSULTATION DATE:  12/04/15  REFERRING MD: Dr. Owens Shark  CHIEF COMPLAINT:  Shortness of breath  HISTORY OF PRESENT ILLNESS:   [name] is a 60 yo Female with PMH significant for DM and CHF.  Patient was apparently having intercourse and became short of breath and she collapsed on the ground.When EMS arrived on the scene her O2 sats were down to 77% and placed on Non re-breather. She was brought in to [facility] and was placed on BiPAP. PCCM TEAM was called to admit the patient. PAST MEDICAL HISTORY :  She  has a past medical history of CHF (congestive heart failure) ([place]) and Diabetes mellitus without complication ([place]).  PAST SURGICAL HISTORY: She  has a past surgical history that includes Appendectomy and Cholecystectomy.  Allergies  Allergen Reactions  . Latex Anaphylaxis and Rash  . Gabapentin Nausea And Vomiting    No current facility-administered medications on file prior to encounter.    Current Outpatient Prescriptions on File Prior to Encounter  Medication Sig  . aspirin 81 MG chewable tablet Chew 81 mg by mouth every morning.  . carvedilol (COREG) 12.5 MG tablet Take 12.5 mg by mouth 2 (two) times daily.  . fluticasone (FLONASE) 50 MCG/ACT nasal spray Place 2 sprays into the nose daily as needed for rhinitis.  . furosemide (LASIX) 40 MG tablet Take 40 mg by mouth daily.   . hydrochlorothiazide (MICROZIDE) 12.5 MG capsule Take 12.5 mg by mouth every evening.  . insulin aspart (NOVOLOG) 100 UNIT/ML FlexPen Inject 14 Units into the skin See admin instructions. Inject 12 units subcutaneously 3 times daily with meals. And as needed.  . insulin regular human CONCENTRATED (HUMULIN R) 500 UNIT/ML injection Inject 0.04 mLs (20 Units total) into the skin 2 (two) times daily with a meal. Pt on sliding scale (Patient taking differently: Inject 12 Units into the  skin 2 (two) times daily. 12 units morning 12 units night)  . levofloxacin (LEVAQUIN) 750 MG tablet Take 1 tablet (750 mg total) by mouth daily.  [name] Kitchen loratadine (CLARITIN) 10 MG tablet Take 10 mg by mouth at bedtime as needed for allergies.  . metFORMIN (GLUCOPHAGE) 1000 MG tablet Take 1,000 mg by mouth 2 (two) times daily.  [name] Kitchen oxyCODONE-acetaminophen (PERCOCET/ROXICET) 5-325 MG tablet Take 1 tablet by mouth 3 (three) times daily as needed for pain.  . potassium chloride SA (K-DUR,KLOR-CON) 20 MEQ tablet Take 20 mEq by mouth daily.  [name] Kitchen PROAIR HFA 108 (90 Base) MCG/ACT inhaler Inhale 2 puffs into the lungs See admin instructions. Inhale 2 puffs by mouth every 4 to 6 hours as needed for shortness of breath/cough.  . ramipril (ALTACE) 10 MG capsule Take 10 mg by mouth at bedtime.  [name] Kitchen SPIRIVA HANDIHALER 18 MCG inhalation capsule Take 18 mcg by mouth at bedtime.   [name] Kitchen zolpidem (AMBIEN) 5 MG tablet Take 5 mg by mouth at bedtime as needed for sleep.    FAMILY HISTORY:  Her has no family status information on file.    SOCIAL HISTORY: She  reports that she has been smoking Cigarettes.  She has been smoking about 0.25 packs per day. She has never used smokeless tobacco. She reports that she does not drink alcohol or use drugs.  REVIEW OF SYSTEMS:   Unable to obtain as the patient is on BiPAP  SUBJECTIVE:  Unable to obtain as the patient is on  BiPAP  VITAL SIGNS: BP 113/63   Pulse 79   Resp (!) 21   Ht 5' 8"  (1.727 m)   SpO2 100%   HEMODYNAMICS:    VENTILATOR SETTINGS: FiO2 (%):  [100 %] 100 %  INTAKE / OUTPUT: No intake/output data recorded.  PHYSICAL EXAMINATION: General:  Morbidly obese AA female Neuro:unable to asssess HEENT:atraumatic, normocephalic, no JVD appreciated Cardiovascular: S1S2,RRR, no MRG noted Lungs:  Diminished, no wheezes, crackles, rhonchi noted Abdomen: obese, nontender Musculoskeletal:  No inflammation/deformity noted Skin: grossly  intact  LABS:  BMET  Recent Labs Lab 12/04/15 0305  NA 132*  K 4.1  CL 99*  CO2 20*  BUN 16  CREATININE 1.52*  GLUCOSE 633*    Electrolytes  Recent Labs Lab 12/04/15 0305  CALCIUM 8.6*    CBC  Recent Labs Lab 12/04/15 0305  WBC 14.0*  HGB 14.5  HCT 45.1  PLT 220    Coag's No results for input(s): APTT, INR in the last 168 hours.  Sepsis Markers No results for input(s): LATICACIDVEN, PROCALCITON, O2SATVEN in the last 168 hours.  ABG  Recent Labs Lab 12/04/15 0308  PHART 7.21*  PCO2ART 51*  PO2ART 71*    Liver Enzymes  Recent Labs Lab 12/04/15 0305  AST 39  ALT 15  ALKPHOS 144*  BILITOT 0.6  ALBUMIN 3.3*    Cardiac Enzymes  Recent Labs Lab 12/04/15 0305  TROPONINI 0.04*    Glucose No results for input(s): GLUCAP in the last 168 hours.  Imaging Dg Chest Port 1 View  Result Date: 12/04/2015 CLINICAL DATA:  Acute onset dyspnea this morning EXAM: PORTABLE CHEST 1 VIEW COMPARISON:  10/28/2015 FINDINGS: Central airspace opacities are present bilaterally, new. No significant effusions. Cardiac and mediastinal contours are unremarkable and unchanged. IMPRESSION: New central airspace opacities. This may represent alveolar edema. Infectious infiltrates are not entirely excluded. Electronically Signed   By: Andreas Newport M.D.   On: 12/04/2015 03:33     STUDIES:  10/11 CT chest>> negative for PE  10/28/15 echo> EF of 55-65% CULTURES: NONE  ANTIBIOTICS: NONE  SIGNIFICANT EVENTS: 11/17 Patient admitted to [facility] for Pulmonary edema secondary to CHF exacerbation.  LINES/TUBES: none ASSESSMENT / PLAN:  PULMONARY A: Acute respiratory failure related to CHF exacerbation Pulmonary edema Hx of COPD P:   Continue BiPAP, wean as tolerated Keep O2 sats>92% Bronchodilators Lasix 40 mgX1 Will obtain uds  CARDIOVASCULAR A:   Acute on chronic CHF Exacerbation Hypertension Mildly elevated troponins P:  Continuous  telemetry Continue ramipril/coreg ekg- SR  RENAL A:   Acute on chronic kidney Injury Hyponatremia P:   Follow chemistry Replace electrolytes per usual guideline  GASTROINTESTINAL A:   No active issues P:   Will start on a diet if patient's cognitive status improves  HEMATOLOGIC A:   No active issues P:  Transfuse per usual guidelines Heparin for DVT prophylaxis  INFECTIOUS A:   Leukocytosis P:   Monitor fever curve Follow CBC  ENDOCRINE A:    Diabetes Melitus Type-2 P:   BS checks ssi coverage Diabetes coordinator consulted  NEUROLOGIC A:   No active issues P:   Minimize sedating drugs Reorient freequently  [place]   12/04/2015, 6:38 AM  PCCM ATTENDING ATTESTATION:  I have evaluated patient with the APP [place], reviewed database in its entirety and discussed care plan in detail. In addition, this patient was discussed on multidisciplinary rounds.   Important exam findings: No distress off BiPAP JVP  cannot be visualized Bibasilar crackles, no wheezes Reg, no M Obese, soft, +BS Ext warm, no edema  Major problems addressed by PCCM team: Acute on chronic resp failure - hypoxemic/hypercarbic DM 2 - poorly controlled AKI  PLAN/REC: Cont diuresis Cont PRN BiPAP Lantus ordered Resistant scale SSI Holding ACEI Change to SDU status   Merton Border, MD PCCM service Mobile [phone] Pager [phone] 12/04/2015

## 2015-12-04 NOTE — Progress Notes (Addendum)
Inpatient Diabetes Program Recommendations  AACE/ADA: New Consensus Statement on Inpatient Glycemic Control (2015)  Target Ranges:  Prepandial:   less than 140 mg/dL      Peak postprandial:   less than 180 mg/dL (1-2 hours)      Critically ill patients:  140 - 180 mg/dL   Lab Results  Component Value Date   GLUCAP 485 (H) 12/04/2015   HGBA1C 12.7 (H) 08/29/2015    Review of Glycemic Control Results for ALEXIS, REBER (MRN [id number]) as of 12/04/2015 08:03  Ref. Range 12/04/2015 06:37 12/04/2015 07:02 12/04/2015 08:00  Glucose-Capillary Latest Ref Range: 65 - 99 mg/dL 505 (HH) 496 (H) 485 (H)    Diabetes history: Type 2 Outpatient Diabetes medications: Novolog 12 units tid, Humulin U500 60 units (using a U100 syringe, she draws 12 units) qam and q supper, Metformin 1068m bid Current orders for Inpatient glycemic control: Novolog 2-6 units q4h  Inpatient Diabetes Program Recommendations:  For the safest and quickest way to drive her blood sugars down, consider placing her on the IV insulin/ glucostabilizer drip for the day- if CBG ideal by supper and she is going to be eating (and tolerating the diet) give U500 30 units at supper and remove the insulin drip 1 hour later.  Continue Humulin R  30 units U500 bid with breakfast and supper.   If the patient remains NPO, consider Lantus 43 units qhs and Novolog 0-15 units q4h  If she is going to eat, consider Humulin U500 30 units qbreakfast and supper.  Add Novolog moderate q4h.    JGentry Fitz RN, BA, MHA, CDE Diabetes Coordinator Inpatient Diabetes Program  [phone](Team Pager) [phone]([place] 12/04/2015 8:25 AM

## 2015-12-04 NOTE — Progress Notes (Signed)
Inpatient Diabetes Program Recommendations  AACE/ADA: New Consensus Statement on Inpatient Glycemic Control (2015)  Target Ranges:  Prepandial:   less than 140 mg/dL      Peak postprandial:   less than 180 mg/dL (1-2 hours)      Critically ill patients:  140 - 180 mg/dL   Lab Results  Component Value Date   GLUCAP 337 (H) 12/04/2015   HGBA1C 12.7 (H) 08/29/2015   Spoke with patient at the bedside.  She was unable to tell me exactly what insulin she was taking.  She said she was absolutely NOT taking Novolog but reported using a blue and orange insulin pen (this is Novolog).  She stated she uses U500 insulin from a bottle/syringe, 10 units before breakfast and 10 units before supper then 10 units of Lantus three times a day with meals.  I told her that [facility] the pharmacist had called the [facility] to clarify her type and doses.  The pharmacy reports that Mrs. Tackett last picked up U500 in May (Mrs.Lietz told me she has a new bottle of U500 in the fridge at home)   When her husband came, she did not have a medication list with her.  Gentry Fitz, RN, BA, MHA, CDE Diabetes Coordinator Inpatient Glycemic Control Team [phone] (Team Pager) [phone] ([place]) 12/04/2015 4:13 PM

## 2015-12-04 NOTE — Progress Notes (Signed)
Chaplain responded to a patient with cardiac arrest problem. Medical team did CPR and they were able to revive the patient. Chaplain provided a ministry of presence and a silence prayer. There was no family member.

## 2015-12-04 NOTE — ED Triage Notes (Signed)
Pt arrived via ems from home where she was having intercourse and became short of breath that led to her collapsing on the ground. Emergency services were called and reported an initial O2 saturation of 77%. EMS reports having to place her on a non rebreather. Upon arrival pt's saturation 79%. Respiratory was at bedside and placed pt on bi-pap at 0307.

## 2015-12-04 NOTE — ED Provider Notes (Signed)
[facility] Emergency Department Provider Note   ____________________________________________   First MD Initiated Contact with Patient 12/04/15 [phone]     (approximate)  I have reviewed the triage vital signs and the nursing notes.   HISTORY  Chief Complaint Respiratory Distress    HPI [name] is a 60 y.o. female brought to the ED from home via EMS with the chief complaint of shortness of breath. EMS reports patient was having sexual intercourse when she experienced shortness of breath. Family called for patient unresponsive in cardiac arrest. Upon EMS arrival, patient was awake and hypoxic in the 70s on room air.States she uses oxygen as needed. History of congestive heart failure and diabetes. Vomited en route. Arrives to the ED awake and conversant. Denies recent fever, chills, chest pain, abdominal pain, diarrhea. Denies recent travel or trauma. Nothing makes her symptoms better or worse.   Past Medical History:  Diagnosis Date  . CHF (congestive heart failure) ([place])   . Diabetes mellitus without complication [facility])     Patient Active Problem List   Diagnosis Date Noted  . PNA (pneumonia) 10/28/2015  . COPD exacerbation ([place]) 08/29/2015    Past Surgical History:  Procedure Laterality Date  . APPENDECTOMY    . CHOLECYSTECTOMY      Prior to Admission medications   Medication Sig Start Date End Date Taking? Authorizing Provider  aspirin 81 MG chewable tablet Chew 81 mg by mouth every morning.    Historical Provider, MD  carvedilol (COREG) 12.5 MG tablet Take 12.5 mg by mouth 2 (two) times daily. 08/05/15   Historical Provider, MD  fluticasone (FLONASE) 50 MCG/ACT nasal spray Place 2 sprays into the nose daily as needed for rhinitis. 06/03/15   Historical Provider, MD  furosemide (LASIX) 40 MG tablet Take 40 mg by mouth daily.  08/17/15 08/16/16  Historical Provider, MD  hydrochlorothiazide (MICROZIDE) 12.5 MG capsule Take 12.5 mg by mouth  every evening. 08/05/15   Historical Provider, MD  insulin aspart (NOVOLOG) 100 UNIT/ML FlexPen Inject 14 Units into the skin See admin instructions. Inject 12 units subcutaneously 3 times daily with meals. And as needed. 10/29/15 10/28/16  Bettey Costa, MD  insulin regular human CONCENTRATED (HUMULIN R) 500 UNIT/ML injection Inject 0.04 mLs (20 Units total) into the skin 2 (two) times daily with a meal. Pt on sliding scale Patient taking differently: Inject 12 Units into the skin 2 (two) times daily. 12 units morning 12 units night 08/30/15   Fritzi Mandes, MD  levofloxacin (LEVAQUIN) 750 MG tablet Take 1 tablet (750 mg total) by mouth daily. 10/29/15   Bettey Costa, MD  loratadine (CLARITIN) 10 MG tablet Take 10 mg by mouth at bedtime as needed for allergies. 05/19/15 05/18/16  Historical Provider, MD  metFORMIN (GLUCOPHAGE) 1000 MG tablet Take 1,000 mg by mouth 2 (two) times daily. 07/30/14   Historical Provider, MD  oxyCODONE-acetaminophen (PERCOCET/ROXICET) 5-325 MG tablet Take 1 tablet by mouth 3 (three) times daily as needed for pain. 07/23/15   Historical Provider, MD  potassium chloride SA (K-DUR,KLOR-CON) 20 MEQ tablet Take 20 mEq by mouth daily.    Historical Provider, MD  PROAIR HFA 108 [phone] Base) MCG/ACT inhaler Inhale 2 puffs into the lungs See admin instructions. Inhale 2 puffs by mouth every 4 to 6 hours as needed for shortness of breath/cough. 06/22/15   Historical Provider, MD  ramipril (ALTACE) 10 MG capsule Take 10 mg by mouth at bedtime. 03/17/15 01/11/16  Historical Provider, MD  [facility]  HANDIHALER 18 MCG inhalation capsule Take 18 mcg by mouth at bedtime.  06/23/15   Historical Provider, MD  zolpidem (AMBIEN) 5 MG tablet Take 5 mg by mouth at bedtime as needed for sleep. 08/17/15 10/28/15  Historical Provider, MD    Allergies Latex and Gabapentin  No family history on file.  Social History Social History  Substance Use Topics  . Smoking status: Current Every Day Smoker    Packs/day: 0.25     Types: Cigarettes  . Smokeless tobacco: Never Used  . Alcohol use No    Review of Systems  Constitutional: No fever/chills. Eyes: No visual changes. ENT: No sore throat. Cardiovascular: Denies chest pain. Respiratory: Positive for shortness of breath. Gastrointestinal: No abdominal pain.  No nausea, no vomiting.  No diarrhea.  No constipation. Genitourinary: Negative for dysuria. Musculoskeletal: Negative for back pain. Skin: Negative for rash. Neurological: Negative for headaches, focal weakness or numbness.  10-point ROS otherwise negative.  ____________________________________________   PHYSICAL EXAM:  VITAL SIGNS: ED Triage Vitals  Enc Vitals Group     BP      Pulse      Resp      Temp      Temp src      SpO2      Weight      Height      Head Circumference      Peak Flow      Pain Score      Pain Loc      Pain Edu?      Excl. in [place]?     Constitutional: Alert and oriented. Ill appearing and in moderate acute distress. Eyes: Conjunctivae are normal. PERRL. EOMI. Head: Atraumatic. Nose: No congestion/rhinnorhea. Mouth/Throat: Mucous membranes are moist.  Oropharynx non-erythematous. Neck: No stridor.   Cardiovascular: Normal rate, regular rhythm. Grossly normal heart sounds.  Good peripheral circulation. Respiratory: Increased respiratory effort.  No retractions. Lungs with rales bilaterally. Gastrointestinal: Obese. Soft and nontender. No distention. No abdominal bruits. No CVA tenderness. Musculoskeletal: No lower extremity tenderness. 2+ nonpitting BLE edema.  No joint effusions. Neurologic:  Normal speech and language. No gross focal neurologic deficits are appreciated. MAEx4. Skin:  Skin is warm, dry and intact. No rash noted. Psychiatric: Mood and affect are normal. Speech and behavior are normal.  ____________________________________________   LABS (all labs ordered are listed, but only abnormal results are displayed)  Labs Reviewed  CBC WITH  DIFFERENTIAL/PLATELET - Abnormal; Notable for the following:       Result Value   WBC 14.0 (*)    RBC 5.27 (*)    RDW 15.0 (*)    Neutro Abs 8.9 (*)    Lymphs Abs 3.8 (*)    Basophils Absolute 0.2 (*)    All other components within normal limits  BLOOD GAS, ARTERIAL - Abnormal; Notable for the following:    pH, Arterial 7.21 (*)    pCO2 arterial 51 (*)    pO2, Arterial 71 (*)    Acid-base deficit 7.8 (*)    All other components within normal limits  COMPREHENSIVE METABOLIC PANEL  TROPONIN I  URINALYSIS COMPLETEWITH MICROSCOPIC ([facility] ONLY)  URINE DRUG SCREEN, QUALITATIVE ([facility] ONLY)  TROPONIN I   ____________________________________________  EKG  ED ECG REPORT I, SUNG,JADE J, the attending physician, personally viewed and interpreted this ECG.   Date: 12/04/2015  EKG Time: 0305  Rate: 99  Rhythm: normal EKG, normal sinus rhythm  Axis: Nonspecific  Intervals:none  ST&T Change: ST depression inferior  laterally  ____________________________________________  RADIOLOGY  Portable chest x-ray interpreted per Dr. Alroy Dust: New central airspace opacities. This may represent alveolar edema.  Infectious infiltrates are not entirely excluded.   ____________________________________________   PROCEDURES  Procedure(s) performed: None  Procedures  Critical Care performed: Yes, see critical care note(s)  CRITICAL CARE Performed by: Paulette Blanch   Total critical care time: 30 minutes  Critical care time was exclusive of separately billable procedures and treating other patients.  Critical care was necessary to treat or prevent imminent or life-threatening deterioration.  Critical care was time spent personally by me on the following activities: development of treatment plan with patient and/or surrogate as well as nursing, discussions with consultants, evaluation of patient's response to treatment, examination of patient, obtaining history from patient or surrogate,  ordering and performing treatments and interventions, ordering and review of laboratory studies, ordering and review of radiographic studies, pulse oximetry and re-evaluation of patient's condition. ____________________________________________   INITIAL IMPRESSION / ASSESSMENT AND PLAN / ED COURSE  Pertinent labs & imaging results that were available during my care of the patient were reviewed by me and considered in my medical decision making (see chart for details).  60 year old female who presents with respiratory distress, likely secondary to congestive heart failure. Patient was initiated on BiPAP upon her arrival to the treatment room. Will obtain screening lab work, chest x-ray and reassess. Anticipate hospital admission.  Clinical Course as of Dec 03 404  Fri Dec 04, 2015  0404 Starting to improve on BiPAP. Given patient's severity, will place Foley to measure ins and outs.  [JS]  4982 Discussed with intensivist to evaluate for admission to CCU.  [JS]    Clinical Course User Index [JS] Paulette Blanch, MD     ____________________________________________   FINAL CLINICAL IMPRESSION(S) / ED DIAGNOSES  Final diagnoses:  Acute respiratory failure with hypoxia and hypercapnia (HCC)  Respiratory distress  Hypoxia  Acute on chronic congestive heart failure, unspecified congestive heart failure type ([place])      NEW MEDICATIONS STARTED DURING THIS VISIT:  New Prescriptions   No medications on file     Note:  This document was prepared using Dragon voice recognition software and may include unintentional dictation errors.    Paulette Blanch, MD 12/04/15 [phone]

## 2015-12-05 ENCOUNTER — Inpatient Hospital Stay: Payer: Medicaid Other

## 2015-12-05 DIAGNOSIS — J9601 Acute respiratory failure with hypoxia: Secondary | ICD-10-CM

## 2015-12-05 DIAGNOSIS — N179 Acute kidney failure, unspecified: Secondary | ICD-10-CM

## 2015-12-05 DIAGNOSIS — J810 Acute pulmonary edema: Secondary | ICD-10-CM

## 2015-12-05 LAB — CBC
HEMATOCRIT: 37.3 % (ref 35.0–47.0)
HEMOGLOBIN: 12.2 g/dL (ref 12.0–16.0)
MCH: 27.2 pg (ref 26.0–34.0)
MCHC: 32.7 g/dL (ref 32.0–36.0)
MCV: 83.2 fL (ref 80.0–100.0)
Platelets: 145 10*3/uL — ABNORMAL LOW (ref 150–440)
RBC: 4.48 MIL/uL (ref 3.80–5.20)
RDW: 14.7 % — ABNORMAL HIGH (ref 11.5–14.5)
WBC: 13.4 10*3/uL — AB (ref 3.6–11.0)

## 2015-12-05 LAB — GLUCOSE, CAPILLARY
GLUCOSE-CAPILLARY: 229 mg/dL — AB (ref 65–99)
Glucose-Capillary: 312 mg/dL — ABNORMAL HIGH (ref 65–99)
Glucose-Capillary: 375 mg/dL — ABNORMAL HIGH (ref 65–99)
Glucose-Capillary: 200 mg/dL — ABNORMAL HIGH (ref 65–99)
Glucose-Capillary: 214 mg/dL — ABNORMAL HIGH (ref 65–99)

## 2015-12-05 LAB — COMPREHENSIVE METABOLIC PANEL
ALBUMIN: 3.2 g/dL — AB (ref 3.5–5.0)
ALT: 11 U/L — ABNORMAL LOW (ref 14–54)
ANION GAP: 6 (ref 5–15)
AST: 14 U/L — AB (ref 15–41)
Alkaline Phosphatase: 108 U/L (ref 38–126)
BILIRUBIN TOTAL: 0.2 mg/dL — AB (ref 0.3–1.2)
BUN: 33 mg/dL — ABNORMAL HIGH (ref 6–20)
CHLORIDE: 102 mmol/L (ref 101–111)
CO2: 26 mmol/L (ref 22–32)
Calcium: 8.7 mg/dL — ABNORMAL LOW (ref 8.9–10.3)
Creatinine, Ser: 2.13 mg/dL — ABNORMAL HIGH (ref 0.44–1.00)
GFR calc Af Amer: 28 mL/min — ABNORMAL LOW (ref >60)
GFR, EST NON AFRICAN AMERICAN: 24 mL/min — AB (ref >60)
Glucose, Bld: 290 mg/dL — ABNORMAL HIGH (ref 65–99)
POTASSIUM: 4.1 mmol/L (ref 3.5–5.1)
Sodium: 134 mmol/L — ABNORMAL LOW (ref 135–145)
TOTAL PROTEIN: 7.7 g/dL (ref 6.5–8.1)

## 2015-12-05 MED ORDER — POLYETHYLENE GLYCOL 3350 17 G PO PACK
17.0000 g | PACK | Freq: Every day | ORAL | Status: DC
  Administered 2015-12-05 – 2015-12-07 (×3): 17 g via ORAL
  Filled 2015-12-05 (×3): qty 1

## 2015-12-05 MED ORDER — NON FORMULARY: 30.0000 [IU] | Freq: Two times a day (BID) | Status: DC

## 2015-12-05 MED ORDER — INSULIN REGULAR HUMAN (CONC) 500 UNIT/ML ~~LOC~~ SOPN
30.0000 [IU] | PEN_INJECTOR | Freq: Two times a day (BID) | SUBCUTANEOUS | Status: DC
  Administered 2015-12-05: 30 [IU] via SUBCUTANEOUS
  Filled 2015-12-05: qty 3

## 2015-12-05 MED ORDER — ENOXAPARIN SODIUM 40 MG/0.4ML ~~LOC~~ SOLN
40.0000 mg | SUBCUTANEOUS | Status: DC
  Administered 2015-12-05 – 2015-12-06 (×2): 40 mg via SUBCUTANEOUS
  Filled 2015-12-05 (×2): qty 0.4

## 2015-12-05 MED ORDER — POTASSIUM CHLORIDE CRYS ER 20 MEQ PO TBCR
20.0000 meq | EXTENDED_RELEASE_TABLET | Freq: Every day | ORAL | Status: DC
  Administered 2015-12-05 – 2015-12-07 (×3): 20 meq via ORAL
  Filled 2015-12-05 (×3): qty 1

## 2015-12-05 MED ORDER — FUROSEMIDE 40 MG PO TABS
40.0000 mg | ORAL_TABLET | Freq: Every day | ORAL | Status: DC
  Administered 2015-12-05 – 2015-12-07 (×3): 40 mg via ORAL
  Filled 2015-12-05 (×3): qty 1

## 2015-12-05 MED ORDER — ALUM & MAG HYDROXIDE-SIMETH 200-200-20 MG/5ML PO SUSP
30.0000 mL | ORAL | Status: DC | PRN
  Administered 2015-12-05 – 2015-12-07 (×4): 30 mL via ORAL
  Filled 2015-12-05 (×4): qty 30

## 2015-12-05 MED ORDER — ASPIRIN 81 MG PO CHEW
81.0000 mg | CHEWABLE_TABLET | ORAL | Status: DC
  Administered 2015-12-05 – 2015-12-07 (×3): 81 mg via ORAL
  Filled 2015-12-05 (×3): qty 1

## 2015-12-05 NOTE — Progress Notes (Addendum)
Inpatient Diabetes Program Recommendations  AACE/ADA: New Consensus Statement on Inpatient Glycemic Control (2015)  Target Ranges:  Prepandial:   less than 140 mg/dL      Peak postprandial:   less than 180 mg/dL (1-2 hours)      Critically ill patients:  140 - 180 mg/dL   Lab Results  Component Value Date   GLUCAP 312 (H) 12/05/2015   HGBA1C 12.7 (H) 08/29/2015   Review of Glycemic Control  Diabetes history: Type 2 Current orders for Inpatient glycemic control: Lantus 40 units, Novolog Resistant TID + Novolog 4 units meal coverage  Inpatient Diabetes Program Recommendations:   Glucose 312 after Lantus 40 units given yesterday. Please consider discontinuing all insulin orders and placing patient on Humulin R U-500 30 units BID with meals since patient is eating. Will watch trends while inpatient.  Thanks,  Tama Headings RN, MSN, [facility] Inpatient Diabetes Coordinator Team Pager [phone] (8a-5p)

## 2015-12-05 NOTE — Progress Notes (Signed)
Anticoagulation monitoring(Lovenox):  60 yo female ordered Lovenox 30 mg Q24h  Filed Weights   12/04/15 0730  Weight: 235 lb 0.2 oz (106.6 kg)   BMI    Lab Results  Component Value Date   CREATININE 2.13 (H) 12/05/2015   CREATININE 1.74 (H) 12/04/2015   CREATININE 1.52 (H) 12/04/2015   Estimated Creatinine Clearance: 35.5 mL/min (by C-G formula based on SCr of 2.13 mg/dL (H)). Hemoglobin & Hematocrit     Component Value Date/Time   HGB 12.2 12/05/2015 0433   HGB 15.0 08/03/2013 2138   HCT 37.3 12/05/2015 0433   HCT 45.7 08/03/2013 2138     Per Protocol for Patient with estCrcl > 30 ml/min and BMI < 40, will transition to Lovenox 40 mg Q24h.

## 2015-12-05 NOTE — Plan of Care (Signed)
Problem: Education: Goal: Knowledge of [facility] Education information/materials will improve Outcome: Progressing BP (!) 110/55   Pulse 76   Temp 97.8 F (36.6 C) (Axillary)   Resp 16   Ht 5' 8"  (1.727 m)   Wt 106.6 kg (235 lb 0.2 oz)   SpO2 99%   BMI 35.73 kg/m   POC reviewed with patient, cont on self repositining, vital signs closely monitored and any concerns addressed at this time. Will continue to monitor.  Cont on [employer] @ 2 L/min

## 2015-12-05 NOTE — Progress Notes (Signed)
Patient received on 2A from ICU, alert and oriented, able to make needs known. Patient noted with abrasion on the left arm, tele box verified with Lovena Le NT. Patient stable at this time, will continue to monitor.

## 2015-12-05 NOTE — Progress Notes (Addendum)
Pharmacy Home Medication Reconciliation Communication High Risk Medication: Humulin U-500 Insulin  Home dose of Humulin U-500 insulin was clarified for this patient. Current dose 30 units Humulin 500 BID.   Type of syringe used at home:  U100 syringe Number or line on syringe to which patient draws up insulin: 12  This equates to 60 units of U-500 insulin  Other comments pertinent to patient home dosing:   Diabetes aware and following along.   Addalynn Kumari D 12/05/2015  3:15 PM

## 2015-12-05 NOTE — Progress Notes (Addendum)
No distress @ rest. No new complaints  Vitals:   12/05/15 0600 12/05/15 0800 12/05/15 0900 12/05/15 1000  BP: 121/80 108/73 (!) 124/97 (!) 110/55  Pulse: 77 69 100 76  Resp: 15 16 (!) 21 16  Temp:  97.9 F (36.6 C)    TempSrc:  Axillary    SpO2: 97% 100% 94% 99%  Weight:      Height:       NAD HEENT WNL No wheezes, bibasilar crackles Reg, no M Obese, soft, +BS No edema No focal neuro deficits  BMP Latest Ref Rng & Units 12/05/2015 12/04/2015 12/04/2015  Glucose 65 - 99 mg/dL 290(H) - 633(HH)  BUN 6 - 20 mg/dL 33(H) - 16  Creatinine 0.44 - 1.00 mg/dL 2.13(H) 1.74(H) 1.52(H)  Sodium 135 - 145 mmol/L 134(L) - 132(L)  Potassium 3.5 - 5.1 mmol/L 4.1 - 4.1  Chloride 101 - 111 mmol/L 102 - 99(L)  CO2 22 - 32 mmol/L 26 - 20(L)  Calcium 8.9 - 10.3 mg/dL 8.7(L) - 8.6(L)   CBC Latest Ref Rng & Units 12/05/2015 12/04/2015 12/04/2015  WBC 3.6 - 11.0 K/uL 13.4(H) 17.4(H) 14.0(H)  Hemoglobin 12.0 - 16.0 g/dL 12.2 13.7 14.5  Hematocrit 35.0 - 47.0 % 37.3 42.1 45.1  Platelets 150 - 440 K/uL 145(L) 173 220   CXR: CM, vasc congestion, improving infitrates  IMPRESSION: Acute respiratory failure Pulmonary edema - resolving Hx of COPD without wheezing presently Diastolic CHF Mild AS Mild mitral regurgitation AKI, nonoliguric DM 2, poorly controlled  Transfer to telemetry Cont diuresis as permitted by BP and renal function Holding ACEI Cont supplemental O2 as needed Monitor BMET intermittently Monitor I/Os Correct electrolytes as indicated Cont Lantus and SSI  I have spoken wit Dr Ether Griffins who has agreed to assume primary duties. PCCM will sign off. Please call if we can be of further assistance  Merton Border, MD PCCM service Mobile [phone] Pager [phone] 12/05/2015

## 2015-12-06 LAB — BASIC METABOLIC PANEL
ANION GAP: 8 (ref 5–15)
BUN: 36 mg/dL — ABNORMAL HIGH (ref 6–20)
CALCIUM: 8.9 mg/dL (ref 8.9–10.3)
CO2: 26 mmol/L (ref 22–32)
CREATININE: 1.57 mg/dL — AB (ref 0.44–1.00)
Chloride: 100 mmol/L — ABNORMAL LOW (ref 101–111)
GFR, EST AFRICAN AMERICAN: 40 mL/min — AB (ref >60)
GFR, EST NON AFRICAN AMERICAN: 35 mL/min — AB (ref >60)
Glucose, Bld: 233 mg/dL — ABNORMAL HIGH (ref 65–99)
Potassium: 4.3 mmol/L (ref 3.5–5.1)
SODIUM: 134 mmol/L — AB (ref 135–145)

## 2015-12-06 LAB — GLUCOSE, CAPILLARY
GLUCOSE-CAPILLARY: 228 mg/dL — AB (ref 65–99)
GLUCOSE-CAPILLARY: 304 mg/dL — AB (ref 65–99)
GLUCOSE-CAPILLARY: 239 mg/dL — AB (ref 65–99)
Glucose-Capillary: 104 mg/dL — ABNORMAL HIGH (ref 65–99)

## 2015-12-06 MED ORDER — INSULIN REGULAR HUMAN (CONC) 500 UNIT/ML ~~LOC~~ SOPN
35.0000 [IU] | PEN_INJECTOR | Freq: Two times a day (BID) | SUBCUTANEOUS | Status: DC
Start: 2015-12-06 — End: 2015-12-06
  Filled 2015-12-06: qty 3

## 2015-12-06 MED ORDER — MUPIROCIN CALCIUM 2 % NA OINT
TOPICAL_OINTMENT | Freq: Two times a day (BID) | NASAL | Status: DC
  Administered 2015-12-06 – 2015-12-07 (×3): 1 via NASAL
  Filled 2015-12-06 (×7): qty 1

## 2015-12-06 MED ORDER — INSULIN ASPART 100 UNIT/ML ~~LOC~~ SOLN
0.0000 [IU] | Freq: Three times a day (TID) | SUBCUTANEOUS | Status: DC
  Administered 2015-12-06: 3 [IU] via SUBCUTANEOUS
  Administered 2015-12-06: 7 [IU] via SUBCUTANEOUS
  Administered 2015-12-07: 2 [IU] via SUBCUTANEOUS
  Administered 2015-12-07: 3 [IU] via SUBCUTANEOUS
  Filled 2015-12-06 (×2): qty 3
  Filled 2015-12-06: qty 7
  Filled 2015-12-06: qty 2

## 2015-12-06 MED ORDER — INSULIN REGULAR HUMAN (CONC) 500 UNIT/ML ~~LOC~~ SOPN
35.0000 [IU] | PEN_INJECTOR | Freq: Two times a day (BID) | SUBCUTANEOUS | Status: DC
  Administered 2015-12-06 – 2015-12-07 (×3): 35 [IU] via SUBCUTANEOUS
  Filled 2015-12-06: qty 3

## 2015-12-06 MED ORDER — INSULIN ASPART 100 UNIT/ML ~~LOC~~ SOLN: 0.0000 [IU] | Freq: Every day | SUBCUTANEOUS | Status: DC

## 2015-12-06 MED ORDER — MOMETASONE FURO-FORMOTEROL FUM 200-5 MCG/ACT IN AERO
2.0000 | INHALATION_SPRAY | Freq: Two times a day (BID) | RESPIRATORY_TRACT | Status: DC
  Administered 2015-12-06 – 2015-12-07 (×3): 2 via RESPIRATORY_TRACT
  Filled 2015-12-06: qty 8.8

## 2015-12-06 NOTE — Progress Notes (Signed)
Pt. Slept well throughout the night with no c/o SOB or acute distress noted. C/O pain to lower back (chronic pain per pt), medicated with effective results.

## 2015-12-06 NOTE — Progress Notes (Signed)
[place] [facility] [place] NAME: [name]    MR#:  [id number]  DATE OF BIRTH:  [birth year]  SUBJECTIVE:  CHIEF COMPLAINT:   Chief Complaint  Patient presents with  . Respiratory Distress   - Patient with diastolic CHF, admitted to ICU on BiPAP for CHF exacerbation. -Transferred from ICU now. On 3 L oxygen, not on home oxygen. -On oral Lasix today. Pending physical therapy consult.  REVIEW OF SYSTEMS:  Review of Systems  Constitutional: Negative for chills, fever and malaise/fatigue.  HENT: Negative for ear discharge, ear pain, hearing loss and nosebleeds.   Eyes: Negative for blurred vision and double vision.  Respiratory: Positive for shortness of breath. Negative for cough and wheezing.   Cardiovascular: Negative for chest pain, palpitations and leg swelling.  Gastrointestinal: Negative for abdominal pain, constipation, diarrhea, nausea and vomiting.  Genitourinary: Negative for dysuria and urgency.  Musculoskeletal: Positive for joint pain and myalgias.       Right wrist and forearm pain from carpal tunnel syndrome  Neurological: Negative for dizziness, tremors, sensory change, speech change, focal weakness, seizures and headaches.  Psychiatric/Behavioral: Negative for depression.    DRUG ALLERGIES:   Allergies  Allergen Reactions  . Latex Anaphylaxis and Rash  . Gabapentin Nausea And Vomiting    VITALS:  Blood pressure (!) 148/76, pulse 80, temperature 98.1 F (36.7 C), temperature source Oral, resp. rate 18, height 5' 8"  (1.727 m), weight 108.1 kg (238 lb 4.8 oz), SpO2 99 %.  PHYSICAL EXAMINATION:  Physical Exam  GENERAL:  60 y.o.-year-old Obese patient lying in the bed with no acute distress.  EYES: Pupils equal, round, reactive to light and accommodation. No scleral icterus. Extraocular muscles intact.  HEENT: Head atraumatic, normocephalic. Oropharynx and nasopharynx clear.  NECK:  Supple, no jugular venous  distention. No thyroid enlargement, no tenderness.  LUNGS: Normal breath sounds bilaterally, no wheezing, rales,rhonchi or crepitation. No use of accessory muscles of respiration. Diminished bibasilar breath sounds. CARDIOVASCULAR: S1, S2 normal. No murmurs, rubs, or gallops.  ABDOMEN: Soft, nontender, nondistended. Bowel sounds present. No organomegaly or mass.  EXTREMITIES: No pedal edema, cyanosis, or clubbing.  NEUROLOGIC: Cranial nerves II through XII are intact. Muscle strength 5/5 in all extremities. Sensation intact. Gait not checked.  PSYCHIATRIC: The patient is alert and oriented x 3.  SKIN: No obvious rash, lesion, or ulcer.    LABORATORY PANEL:   CBC  Recent Labs Lab 12/05/15 0433  WBC 13.4*  HGB 12.2  HCT 37.3  PLT 145*   ------------------------------------------------------------------------------------------------------------------  Chemistries   Recent Labs Lab 12/05/15 0433 12/06/15 0622  NA 134* 134*  K 4.1 4.3  CL 102 100*  CO2 26 26  GLUCOSE 290* 233*  BUN 33* 36*  CREATININE 2.13* 1.57*  CALCIUM 8.7* 8.9  AST 14*  --   ALT 11*  --   ALKPHOS 108  --   BILITOT 0.2*  --    ------------------------------------------------------------------------------------------------------------------  Cardiac Enzymes  Recent Labs Lab 12/04/15 0626  TROPONINI 0.10*   ------------------------------------------------------------------------------------------------------------------  RADIOLOGY:  Dg Abd 1 View  Result Date: 12/05/2015 CLINICAL DATA:  Abdominal pain EXAM: ABDOMEN - 1 VIEW COMPARISON:  CT abdomen and pelvis March 12, 2012 FINDINGS: There is moderate stool throughout the colon. There is no bowel dilatation or air-fluid level suggesting bowel obstruction. No free air. There is atelectatic change in the left lung base. There are arterial vascular calcifications in the pelvis. IMPRESSION: No bowel obstruction or free air.  Moderate stool  throughout colon. Atelectasis left lung base. Iliac artery atherosclerosis. Electronically Signed   By: Lowella Grip III M.D.   On: 12/05/2015 21:27   Dg Chest Port 1 View  Result Date: 12/05/2015 CLINICAL DATA:  Respiratory failure EXAM: PORTABLE CHEST 1 VIEW COMPARISON:  Yesterday FINDINGS: Rapid partial clearing of bilateral airspace disease. More streaky opacities now present, with volume loss on the left. Cardiopericardial enlargement. No edema, effusion, or pneumothorax. Tracheal deviation attributed to rotation, not seen previously. IMPRESSION: Significantly improved airspace disease/edema. Increased basilar atelectasis, especially on the left, with superimposed aspiration or pneumonia not excluded. Electronically Signed   By: Monte Fantasia M.D.   On: 12/05/2015 07:07    EKG:   Orders placed or performed during the hospital encounter of 12/04/15  . EKG 12-Lead  . EKG 12-Lead  . ED EKG  . ED EKG    ASSESSMENT AND PLAN:   60 year old female with past medical history significant for insulin-dependent diabetes mellitus, diastolic congestive heart failure presents to hospital secondary to worsening shortness of breath.  #1 acute on chronic diastolic CHF exacerbation-causing acute hypoxic respiratory failure on admission. -Required BiPAP in the ICU. Currently on 3 L oxygen. -Was never evaluated for home oxygen. Continue to wean oxygen and evaluate for home oxygen requirements. -We will need home health at discharge. -On oral Lasix. -Recent echocardiogram showing EF of 55-60%. Continue aspirin and Coreg.  #2 insulin-dependent diabetes mellitus-uncontrolled diabetes. Last A1c 12.7, about 3 months ago. -Repeat A1c. Appreciate diabetes coordinator input. Currently on concentrated U500 insulin at 30 units twice a day. Increase to 35 units twice a day. Continue sliding scale insulin  #3 acute renal failure on CK D-cardiorenal syndrome. Has CK D stage III at baseline -Improved after  diuresis, react and at 1.5  #4 DVT prophylaxis-on Lovenox  #5 COPD-stable. When necessary nebs and Spiriva. Added Dulera.  Physical therapy consult pending. Anticipate discharge tomorrow.    All the records are reviewed and case discussed with Care Management/Social Workerr. Management plans discussed with the patient, family and they are in agreement.  CODE STATUS: Full code  TOTAL TIME TAKING CARE OF THIS PATIENT: 38 minutes.   POSSIBLE D/C tomorrow, DEPENDING ON CLINICAL CONDITION.   Gladstone Lighter M.D on 12/06/2015 at 8:45 AM  Between 7am to 6pm - Pager - [phone]  After 6pm go to [email or web address] - password [email or web address] [facility]  Office  [phone]  CC: Primary care physician; [place]

## 2015-12-06 NOTE — Progress Notes (Signed)
Pt. Has c/o gas, Magdalene Tovic, PA, paged new orders see emar.

## 2015-12-06 NOTE — Progress Notes (Signed)
Inpatient Diabetes Program Recommendations  AACE/ADA: New Consensus Statement on Inpatient Glycemic Control (2015)  Target Ranges:  Prepandial:   less than 140 mg/dL      Peak postprandial:   less than 180 mg/dL (1-2 hours)      Critically ill patients:  140 - 180 mg/dL   Lab Results  Component Value Date   GLUCAP 229 (H) 12/05/2015   HGBA1C 12.7 (H) 08/29/2015    Review of Glycemic Control   Diabetes history:Type 2 Current orders for Inpatient glycemic control: Humulin R U-500 30 units BID with meals  Inpatient Diabetes Program Recommendations:   Glucose in the 200's, please consider increasing U-500 dose to 35 units BID with meals.  Thanks,  Tama Headings RN, MSN, [facility] Inpatient Diabetes Coordinator Team Pager [phone] (8a-5p)

## 2015-12-06 NOTE — Evaluation (Signed)
Physical Therapy Evaluation Patient Details Name: [name] MRN: [id number] DOB: 08-12-1955 Today's Date: 12/06/2015   History of Present Illness  [name] is a 60 yo Female with PMH significant for DM and CHF.  Patient was apparently having intercourse and became short of breath and she collapsed on the ground.When EMS arrived on the scene her O2 sats were down to 77% and placed on Non re-breather. She was brought in to [facility] and was placed on BiPAP. PCCM TEAM was called to admit the patient. Pt has stabilized and transferred to 2A.  Clinical Impression  Pt admitted with above diagnosis. Pt currently with functional limitations due to the deficits listed below (see PT Problem List).  Pt demonstrates difficulty with bed mobility due to chronic L shoulder pain and weakness. She demonstrates transfers and ambulation with CGA only and is reasonably stable with ambulation while using rolling walker. Pt reports some mild decline in strength and demonstrates some general deconditioning during evaluation. She also presents with some higher level balance deficits such as single leg balance. She would benefit from [facility] PT at discharge in order to improve strength and balance. Pt will benefit from skilled PT services to address deficits in strength, balance, and mobility in order to return to full function at home.     Follow Up Recommendations Home health PT    Equipment Recommendations  None recommended by PT (Should use rollator after returning home)    Recommendations for Other Services       Precautions / Restrictions Precautions Precautions: Fall Restrictions Weight Bearing Restrictions: No      Mobility  Bed Mobility Overal bed mobility: Needs Assistance Bed Mobility: Supine to Sit     Supine to sit: Min assist     General bed mobility comments: Pt requires minA+1 to go from L sidelying to sitting due to difficulty utilizing her LUE. Good rolling demontrated onto L  side  Transfers Overall transfer level: Needs assistance   Transfers: Sit to/from Stand Sit to Stand: Min guard         General transfer comment: Pt demonstrates good strength and stability with sit to stand transfers with use of rolling walker. No balance deficits noted  Ambulation/Gait Ambulation/Gait assistance: Min guard Ambulation Distance (Feet): 40 Feet Assistive device: Rolling walker (2 wheeled) Gait Pattern/deviations: Step-through pattern Gait velocity: Functional for full household ambulation Gait velocity interpretation: <1.8 ft/sec, indicative of risk for recurrent falls General Gait Details: Pt ambulates multiple laps in room with rolling walker. Good stability noted during ambulation and with turns while using rolling walker. Pt reports feeling slightly weaker than normal with ambulation. Denies DOE. Supplemental O2 utilized during ambulation.  Stairs            Wheelchair Mobility    Modified Rankin (Stroke Patients Only)       Balance Overall balance assessment: Needs assistance Sitting-balance support: No upper extremity supported Sitting balance-Leahy Scale: Good     Standing balance support: No upper extremity supported Standing balance-Leahy Scale: Fair Standing balance comment: Able to maintain feet apart and together without UE support. Negative Rhomberg. Single leg balance 1-2 seconds                             Pertinent Vitals/Pain Pain Assessment: 0-10 Pain Score: 10-Worst pain ever Pain Location: L shoulder 10/10 chronic PTA, back pain 8/10 chronic PTA Pain Intervention(s): Monitored during session;Premedicated before session    Home Living  Family/patient expects to be discharged to:: Private residence Living Arrangements: Spouse/significant other Available Help at Discharge: Family Type of Home: House Home Access: Stairs to enter Entrance Stairs-Rails: Right;Left;Can reach both Technical brewer of Steps:  6 Home Layout: One level Home Equipment: Clinical cytogeneticist - 4 wheels (no grab bars)      Prior Function Level of Independence: Needs assistance   Gait / Transfers Assistance Needed: Ambulates with rolling walker intermittently. Limited community ambulation with rollator. Pt uses O2 intermittently  ADL's / Homemaking Assistance Needed: Independent with ADLs, assist with IADLs. Drives. Home O2        Hand Dominance   Dominant Hand: Right    Extremity/Trunk Assessment   Upper Extremity Assessment: LUE deficits/detail       LUE Deficits / Details: Pt reports chronic L shoulder pain chronic prior to admission. This limits her ability to perform L shoulder flexion as well as resisted strength testing at elbow. Good functional grip strength with use of rolling walker   Lower Extremity Assessment: Overall WFL for tasks assessed         Communication   Communication: No difficulties  Cognition Arousal/Alertness: Awake/alert Behavior During Therapy: WFL for tasks assessed/performed Overall Cognitive Status: Within Functional Limits for tasks assessed                      General Comments      Exercises     Assessment/Plan    PT Assessment Patient needs continued PT services  PT Problem List Decreased strength;Decreased activity tolerance;Decreased balance;Decreased mobility;Pain          PT Treatment Interventions DME instruction;Gait training;Stair training;Therapeutic activities;Therapeutic exercise;Balance training;Neuromuscular re-education;Cognitive remediation;Patient/family education    PT Goals (Current goals can be found in the Care Plan section)  Acute Rehab PT Goals Patient Stated Goal: Return to prior level of function at home PT Goal Formulation: With patient Time For Goal Achievement: 12/20/15 Potential to Achieve Goals: Good    Frequency Min 2X/week   Barriers to discharge        Co-evaluation               End of Session  Equipment Utilized During Treatment: Gait belt;Oxygen Activity Tolerance: Patient tolerated treatment well Patient left: in bed;with call bell/phone within reach           Time: 1142-1202 PT Time Calculation (min) (ACUTE ONLY): 20 min   Charges:   PT Evaluation $PT Eval Low Complexity: 1 Procedure     PT G Codes:       Lyndel Safe Huprich PT, DPT   Huprich,Jason 12/06/2015, 12:18 PM

## 2015-12-06 NOTE — Progress Notes (Signed)
SATURATION QUALIFICATIONS: (This note is used to comply with regulatory documentation for home oxygen)  Patient Saturations on Room Air at Rest = 100%  Patient Saturations on Room Air while Ambulating = 94%  Patient Saturations on [place] of oxygen while Ambulating = 0%  Please briefly explain why patient needs home oxygen: Patient oxygen level on room air 100%, while ambulating on room air 94%.

## 2015-12-07 LAB — GLUCOSE, CAPILLARY
Glucose-Capillary: 180 mg/dL — ABNORMAL HIGH (ref 65–99)
Glucose-Capillary: 210 mg/dL — ABNORMAL HIGH (ref 65–99)

## 2015-12-07 LAB — BASIC METABOLIC PANEL
Anion gap: 8 (ref 5–15)
BUN: 35 mg/dL — AB (ref 6–20)
CO2: 29 mmol/L (ref 22–32)
Calcium: 9.3 mg/dL (ref 8.9–10.3)
Chloride: 99 mmol/L — ABNORMAL LOW (ref 101–111)
Creatinine, Ser: 1.43 mg/dL — ABNORMAL HIGH (ref 0.44–1.00)
GFR calc Af Amer: 45 mL/min — ABNORMAL LOW (ref >60)
GFR, EST NON AFRICAN AMERICAN: 39 mL/min — AB (ref >60)
GLUCOSE: 166 mg/dL — AB (ref 65–99)
POTASSIUM: 3.8 mmol/L (ref 3.5–5.1)
Sodium: 136 mmol/L (ref 135–145)

## 2015-12-07 LAB — HEMOGLOBIN A1C
Hgb A1c MFr Bld: 10.8 % — ABNORMAL HIGH (ref 4.8–5.6)
Mean Plasma Glucose: 263 mg/dL

## 2015-12-07 MED ORDER — OXYCODONE-ACETAMINOPHEN 5-325 MG PO TABS: 1.0000 | ORAL_TABLET | Freq: Three times a day (TID) | ORAL | 0 refills | Status: DC | PRN

## 2015-12-07 NOTE — Progress Notes (Signed)
SATURATION QUALIFICATIONS: (This note is used to comply with regulatory documentation for home oxygen)  Patient Saturations on Room Air at Rest =94%  Patient Saturations on Room Air while Ambulating = 93%     Please briefly explain why patient needs home oxygen: does not qualify

## 2015-12-07 NOTE — Discharge Instructions (Signed)
Heart Failure Heart failure means your heart has trouble pumping blood. This makes it hard for your body to work well. Heart failure is usually a long-term (chronic) condition. You must take good care of yourself and follow your doctor's treatment plan. HOME CARE  Take your heart medicine as told by your doctor.  Do not stop taking medicine unless your doctor tells you to.  Do not skip any dose of medicine.  Refill your medicines before they run out.  Take other medicines only as told by your doctor or pharmacist.  Stay active if told by your doctor. The elderly and people with severe heart failure should talk with a doctor about physical activity.  Eat heart-healthy foods. Choose foods that are without trans fat and are low in saturated fat, cholesterol, and salt (sodium). This includes fresh or frozen fruits and vegetables, fish, lean meats, fat-free or low-fat dairy foods, whole grains, and high-fiber foods. Lentils and dried peas and beans (legumes) are also good choices.  Limit salt if told by your doctor.  Cook in a healthy way. Roast, grill, broil, bake, poach, steam, or stir-fry foods.  Limit fluids as told by your doctor.  Weigh yourself every morning. Do this after you pee (urinate) and before you eat breakfast. Write down your weight to give to your doctor.  Take your blood pressure and write it down if your doctor tells you to.  Ask your doctor how to check your pulse. Check your pulse as told.  Lose weight if told by your doctor.  Stop smoking or chewing tobacco. Do not use gum or patches that help you quit without your doctor's approval.  Schedule and go to doctor visits as told.  Nonpregnant women should have no more than 1 drink a day. Men should have no more than 2 drinks a day. Talk to your doctor about drinking alcohol.  Stop illegal drug use.  Stay current with shots (immunizations).  Manage your health conditions as told by your doctor.  Learn to  manage your stress.  Rest when you are tired.  If it is really hot outside:  Avoid intense activities.  Use air conditioning or fans, or get in a cooler place.  Avoid caffeine and alcohol.  Wear loose-fitting, lightweight, and light-colored clothing.  If it is really cold outside:  Avoid intense activities.  Layer your clothing.  Wear mittens or gloves, a hat, and a scarf when going outside.  Avoid alcohol.  Learn about heart failure and get support as needed.  Get help to maintain or improve your quality of life and your ability to care for yourself as needed. GET HELP IF:   You gain weight quickly.  You are more short of breath than usual.  You cannot do your normal activities.  You tire easily.  You cough more than normal, especially with activity.  You have any or more puffiness (swelling) in areas such as your hands, feet, ankles, or belly (abdomen).  You cannot sleep because it is hard to breathe.  You feel like your heart is beating fast (palpitations).  You get dizzy or light-headed when you stand up. GET HELP RIGHT AWAY IF:   You have trouble breathing.  There is a change in mental status, such as becoming less alert or not being able to focus.  You have chest pain or discomfort.  You faint. MAKE SURE YOU:   Understand these instructions.  Will watch your condition.  Will get help right away if  you are not doing well or get worse. This information is not intended to replace advice given to you by your health care provider. Make sure you discuss any questions you have with your health care provider. Document Released: 10/13/2007 Document Revised: 01/24/2014 Document Reviewed: 02/20/2012 Elsevier Interactive Patient Education  2017 [facility] appointment on December 22, 2015 at 9:30am with Darylene Price, [place]. Please call [phone] to reschedule.  Heart healthy and ADA diet. HHPT

## 2015-12-07 NOTE — Care Management (Addendum)
Patient admitted from home with congestive heart failure.   A referral has been made to the heart failure clinic.  Patient says she has home 02 "that I use when I need it" through [facility].  She is agreeable to home health nurse and agency preference is [facility].  Discussed with attending that patient does not qualify for home physical therapy under her medicaid. Patient confirms that her PCP is Dr Leonides Sake at the [facility] clinic.  She confirms her contact information is correct. Patient does not have any scales- provided.

## 2015-12-07 NOTE — Progress Notes (Signed)
Initial Heart Failure Clinic appointment scheduled on December 22, 2015 at 9:30am. Thank you.

## 2015-12-07 NOTE — Progress Notes (Signed)
Pt. Slept well throughout the night, with no signs or c/o SOB or acute distress noted. C/O pain x1, medicated with effective result. C/O gas, medicated with effective results.

## 2015-12-07 NOTE — Discharge Summary (Signed)
[place] [facility] [place] NAME: [name]    MR#:  [id number]  DATE OF BIRTH:  1955/07/26  DATE OF ADMISSION:  12/04/2015   ADMITTING PHYSICIAN: Wilhelmina Mcardle, MD  DATE OF DISCHARGE: 12/07/2015  2:10 PM  PRIMARY CARE PHYSICIAN: [facility]   ADMISSION DIAGNOSIS:  Respiratory distress [R06.00] Hypoxia [R09.02] Acute respiratory failure with hypoxia and hypercapnia (HCC) [J96.01, J96.02] Acute on chronic congestive heart failure, unspecified congestive heart failure type ([place]) [I50.9] DISCHARGE DIAGNOSIS:  Active Problems:   CHF exacerbation (HCC) acute on chronic diastolic CHF exacerbation-causing acute hypoxic respiratory failure SECONDARY DIAGNOSIS:   Past Medical History:  Diagnosis Date  . CHF (congestive heart failure) ([place])   . Diabetes mellitus without complication [facility])    HOSPITAL COURSE:   60 year old female with past medical history significant for insulin-dependent diabetes mellitus, diastolic congestive heart failure presents to hospital secondary to worsening shortness of breath.  #1 acute on chronic diastolic CHF exacerbation-causing acute hypoxic respiratory failure on admission. -Required BiPAP in the ICU.Then on 3 L oxygen. Off O2 this am. -On oral Lasix. -Recent echocardiogram showing EF of 55-60%. Continue aspirin and Coreg.  #2 insulin-dependent diabetes mellitus-uncontrolled diabetes. Last A1c 12.7, about 3 months ago. -Repeat A1c 10.8. Appreciate diabetes coordinator input. Currently on concentrated U500 insulin at 30 units twice a day. Increase to 35 units twice a day. Continue sliding scale insulin resume home meds and follow up PCP.  #3 acute renal failure on CK D-cardiorenal syndrome. Has CKD stage III at baseline -Improved after diuresis, Cr. at 1.5  #4 DVT prophylaxis-on Lovenox  #5 COPD-stable. When necessary nebs and Spiriva. Added Dulera.  Physical therapy consult  suggested home health PT, but patient is not qualified due to insurance per case manager.  DISCHARGE CONDITIONS:  [name], discharged home today. CONSULTS OBTAINED:   DRUG ALLERGIES:   Allergies  Allergen Reactions  . Latex Anaphylaxis and Rash  . Gabapentin Nausea And Vomiting   DISCHARGE MEDICATIONS:     Medication List    STOP taking these medications   levofloxacin 750 MG tablet Commonly known as:  LEVAQUIN   zolpidem 5 MG tablet Commonly known as:  AMBIEN     TAKE these medications   aspirin 81 MG chewable tablet Chew 81 mg by mouth every morning.   carvedilol 12.5 MG tablet Commonly known as:  COREG Take 12.5 mg by mouth 2 (two) times daily.   fluticasone 50 MCG/ACT nasal spray Commonly known as:  FLONASE Place 2 sprays into the nose daily as needed for rhinitis.   furosemide 40 MG tablet Commonly known as:  LASIX Take 40 mg by mouth daily.   hydrochlorothiazide 12.5 MG capsule Commonly known as:  MICROZIDE Take 12.5 mg by mouth every evening.   insulin aspart 100 UNIT/ML FlexPen Commonly known as:  NOVOLOG Inject 14 Units into the skin See admin instructions. Inject 12 units subcutaneously 3 times daily with meals. And as needed.   insulin regular human CONCENTRATED 500 UNIT/ML injection Commonly known as:  HUMULIN R Inject 0.04 mLs (20 Units total) into the skin 2 (two) times daily with a meal. Pt on sliding scale What changed:  how much to take  when to take this  additional instructions   loratadine 10 MG tablet Commonly known as:  CLARITIN Take 10 mg by mouth at bedtime as needed for allergies.   metFORMIN 1000 MG tablet Commonly known as:  GLUCOPHAGE Take 1,000 mg  by mouth 2 (two) times daily.   oxyCODONE-acetaminophen 5-325 MG tablet Commonly known as:  PERCOCET/ROXICET Take 1 tablet by mouth 3 (three) times daily as needed. What changed:  reasons to take this   potassium chloride SA 20 MEQ tablet Commonly known as:   K-DUR,KLOR-CON Take 20 mEq by mouth daily.   PROAIR HFA 108 (90 Base) MCG/ACT inhaler Generic drug:  albuterol Inhale 2 puffs into the lungs See admin instructions. Inhale 2 puffs by mouth every 4 to 6 hours as needed for shortness of breath/cough.   ramipril 10 MG capsule Commonly known as:  ALTACE Take 10 mg by mouth at bedtime.   SPIRIVA HANDIHALER 18 MCG inhalation capsule Generic drug:  tiotropium Take 18 mcg by mouth at bedtime.        DISCHARGE INSTRUCTIONS:  See AVS. If you experience worsening of your admission symptoms, develop shortness of breath, life threatening emergency, suicidal or homicidal thoughts you must seek medical attention immediately by calling 911 or calling your MD immediately  if symptoms less severe.  You Must read complete instructions/literature along with all the possible adverse reactions/side effects for all the Medicines you take and that have been prescribed to you. Take any new Medicines after you have completely understood and accpet all the possible adverse reactions/side effects.   Please note  You were cared for by a hospitalist during your hospital stay. If you have any questions about your discharge medications or the care you received while you were in the hospital after you are discharged, you can call the unit and asked to speak with the hospitalist on call if the hospitalist that took care of you is not available. Once you are discharged, your primary care physician will handle any further medical issues. Please note that NO REFILLS for any discharge medications will be authorized once you are discharged, as it is imperative that you return to your primary care physician (or establish a relationship with a primary care physician if you do not have one) for your aftercare needs so that they can reassess your need for medications and monitor your lab values.    On the day of Discharge:  VITAL SIGNS:  Blood pressure (!) 101/49, pulse 73,  temperature 97.6 F (36.4 C), resp. rate 18, height 5' 4"  (1.626 m), weight 236 lb 3.2 oz (107.1 kg), SpO2 93 %. PHYSICAL EXAMINATION:  GENERAL:  60 y.o.-year-old patient lying in the bed with no acute distress. Obese. EYES: Pupils equal, round, reactive to light and accommodation. No scleral icterus. Extraocular muscles intact.  HEENT: Head atraumatic, normocephalic. Oropharynx and nasopharynx clear.  NECK:  Supple, no jugular venous distention. No thyroid enlargement, no tenderness.  LUNGS: Normal breath sounds bilaterally, no wheezing, rales,rhonchi or crepitation. No use of accessory muscles of respiration.  CARDIOVASCULAR: S1, S2 normal. No murmurs, rubs, or gallops.  ABDOMEN: Soft, non-tender, non-distended. Bowel sounds present. No organomegaly or mass.  EXTREMITIES: No pedal edema, cyanosis, or clubbing.  NEUROLOGIC: Cranial nerves II through XII are intact. Muscle strength 5/5 in all extremities. Sensation intact. Gait not checked.  PSYCHIATRIC: The patient is alert and oriented x 3.  SKIN: No obvious rash, lesion, or ulcer.  DATA REVIEW:   CBC  Recent Labs Lab 12/05/15 0433  WBC 13.4*  HGB 12.2  HCT 37.3  PLT 145*    Chemistries   Recent Labs Lab 12/05/15 0433  12/07/15 0339  NA 134*  < > 136  K 4.1  < > 3.8  CL  102  < > 99*  CO2 26  < > 29  GLUCOSE 290*  < > 166*  BUN 33*  < > 35*  CREATININE 2.13*  < > 1.43*  CALCIUM 8.7*  < > 9.3  AST 14*  --   --   ALT 11*  --   --   ALKPHOS 108  --   --   BILITOT 0.2*  --   --   < > = values in this interval not displayed.   Microbiology Results  Results for orders placed or performed during the hospital encounter of 12/04/15  MRSA PCR Screening     Status: Abnormal   Collection Time: 12/04/15  7:05 AM  Result Value Ref Range Status   MRSA by PCR POSITIVE (A) NEGATIVE Final    Comment:        The GeneXpert MRSA Assay (FDA approved for NASAL specimens only), is one component of a comprehensive MRSA  colonization surveillance program. It is not intended to diagnose MRSA infection nor to guide or monitor treatment for MRSA infections. RESULT CALLED TO, READ BACK BY AND VERIFIED WITH: LYDIA YOUNG AT [phone] ON 12/04/15.[name] KitchenMarland KitchenRadom     RADIOLOGY:  No results found.   Management plans discussed with the patient, family and they are in agreement.  CODE STATUS:     Code Status Orders        Start     Ordered   12/04/15 0508  Full code  Continuous     12/04/15 0509    Code Status History    Date Active Date Inactive Code Status Order ID Comments User Context   10/28/2015  4:40 PM 10/29/2015  5:11 PM Full Code [id number]  Bettey Costa, MD Inpatient   08/29/2015  2:38 PM 08/30/2015  5:50 PM Full Code [id number]  Harvie Bridge, DO Inpatient      TOTAL TIME TAKING CARE OF THIS PATIENT: 36 minutes.    Demetrios Loll M.D on 12/07/2015 at 3:54 PM  Between 7am to 6pm - Pager - [phone]  After 6pm go to [email or web address] - Proofreader  [facility]  Office  [phone]  CC: Primary care physician; [facility]   Note: This dictation was prepared with Dragon dictation along with smaller phrase technology. Any transcriptional errors that result from this process are unintentional.

## 2015-12-22 ENCOUNTER — Telehealth: Payer: Self-pay | Admitting: Family

## 2015-12-22 ENCOUNTER — Ambulatory Visit: Payer: Medicaid Other | Admitting: Family

## 2015-12-22 ENCOUNTER — Telehealth: Payer: Self-pay | Admitting: Gastroenterology

## 2015-12-22 NOTE — Telephone Encounter (Signed)
Patient missed her initial appointment at the [facility] on 12/22/15. Will attempt to reschedule.

## 2015-12-22 NOTE — Telephone Encounter (Signed)
colonoscopy

## 2015-12-28 NOTE — Telephone Encounter (Signed)
Tried contacting pt to schedule colonoscopy but mailbox was full on 12/25/15 @ 3:20pm.

## 2016-01-15 NOTE — Telephone Encounter (Signed)
Tried contacting pt again today. Phone not working. Mailed letter requesting a return call to schedule colonoscopy.

## 2016-09-22 IMAGING — DX DG CHEST 1V PORT
1 series · 1 of 1 positions shown · non-contrast
Comparison: 04/01/2014

CLINICAL DATA: Pt to ED c/o shortness of breath x 1 month, worse
today, pt labored and having difficulty speaking in full sentences;
h/o chf.

EXAM:
PORTABLE CHEST - 1 VIEW

[chest ap]
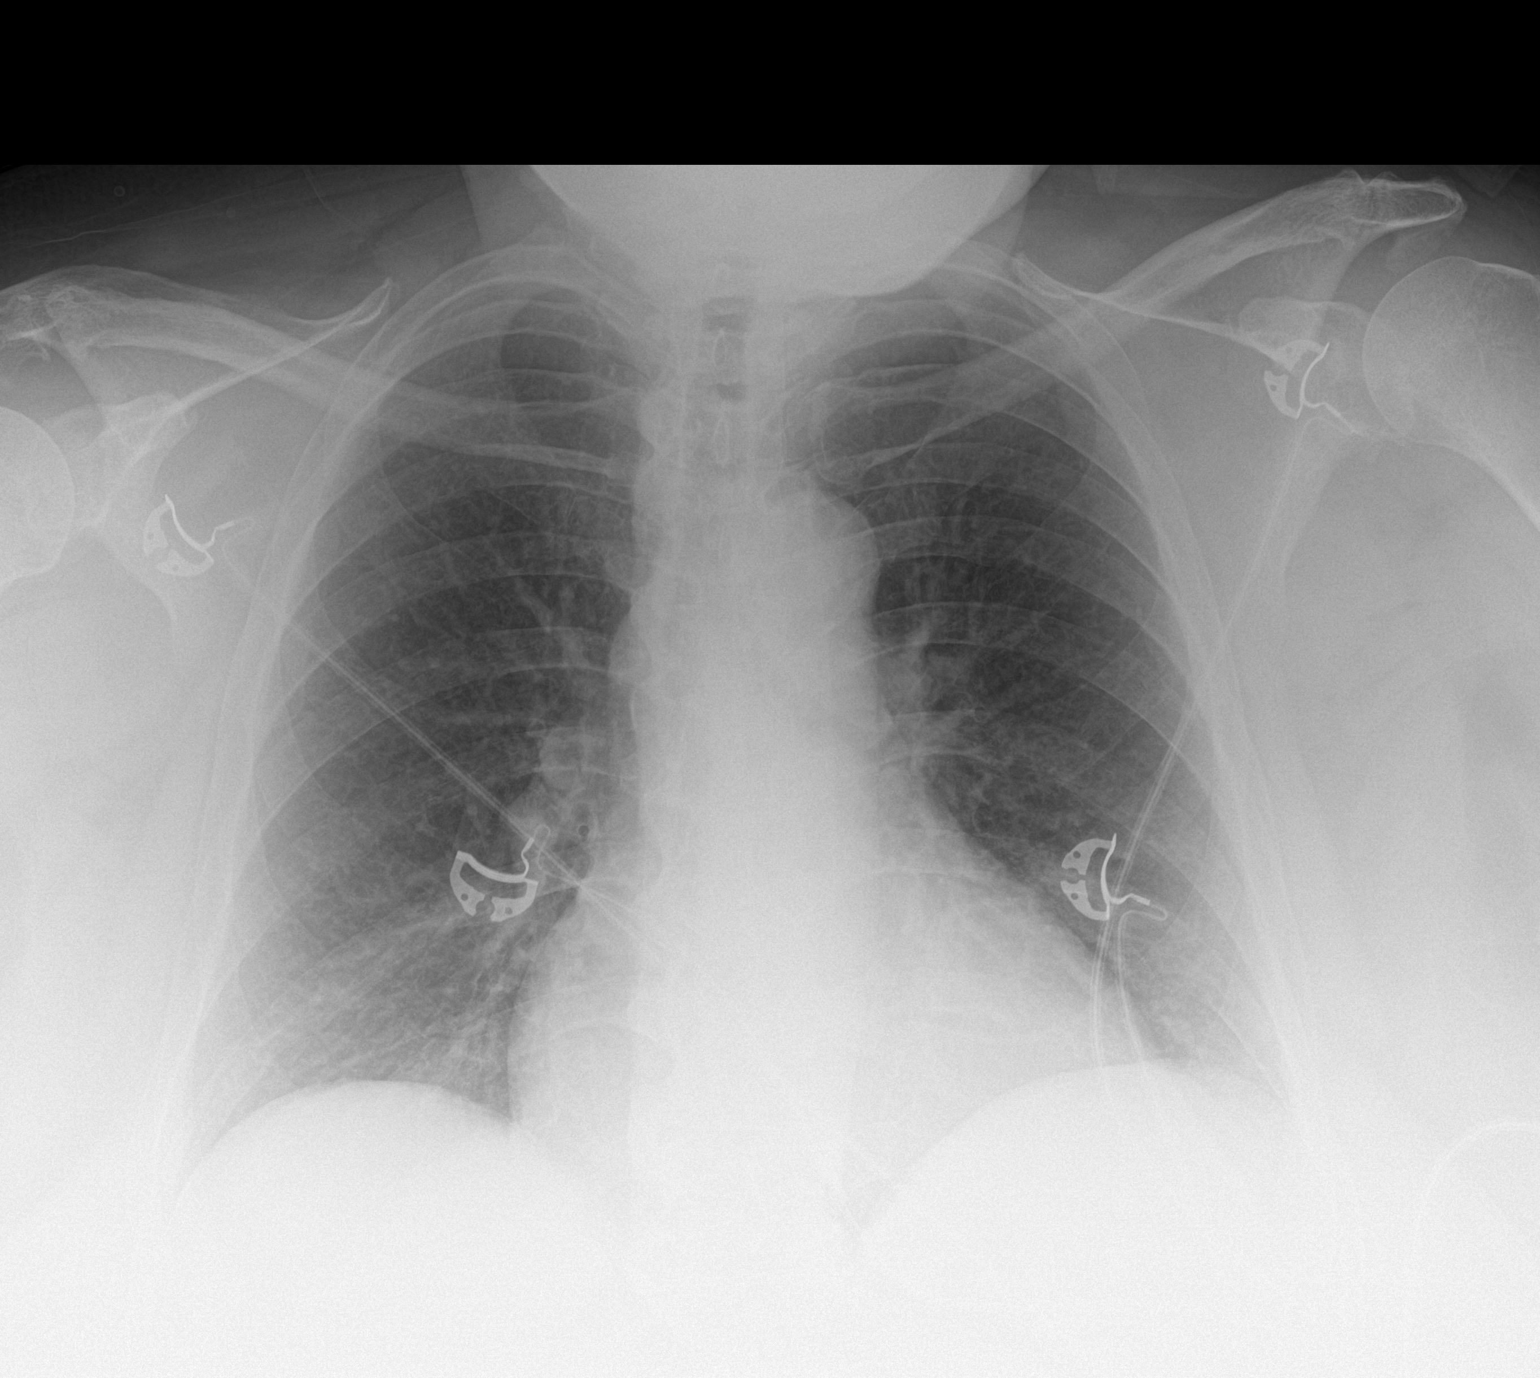

[1 of 1 positions shown; findings below may reference images not displayed]

FINDINGS: Lungs are clear.

Heart size and mediastinal contours are within normal limits.
Atheromatous aorta.

No effusion.

Visualized bones unremarkable.
IMPRESSION: 1. No acute cardiopulmonary disease.
2.  Aortic Atherosclerosis ([id number])

## 2016-11-21 IMAGING — CR DG CHEST 2V
1 series · 2 of 2 positions shown · non-contrast
Comparison: 08/29/2015.

CLINICAL DATA: Difficulty breathing.

EXAM:
CHEST  2 VIEW

[Series 1: dg chest 2 view · 0.14mm/px · 2 of 2 slices shown]
[im 1/2]
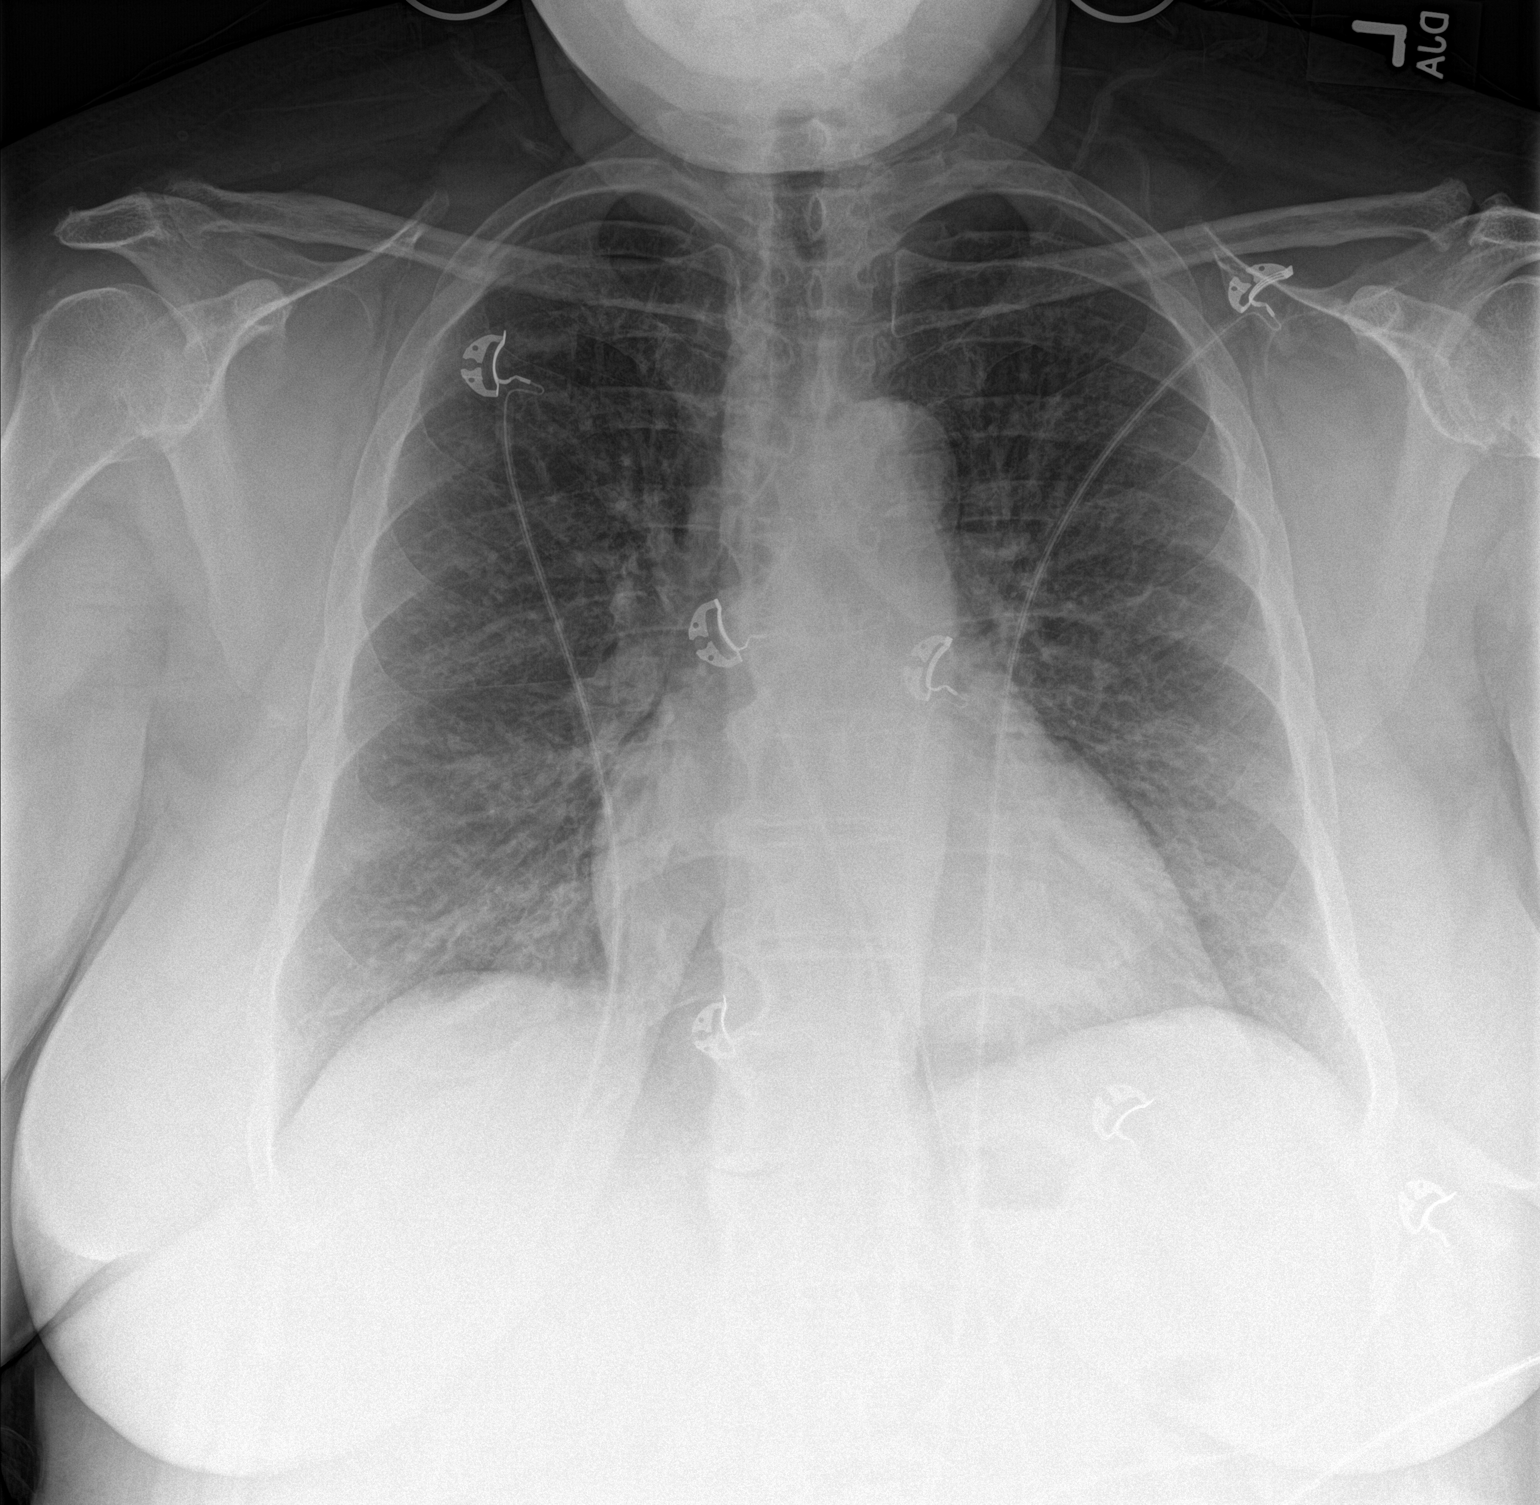
[im 2/2]
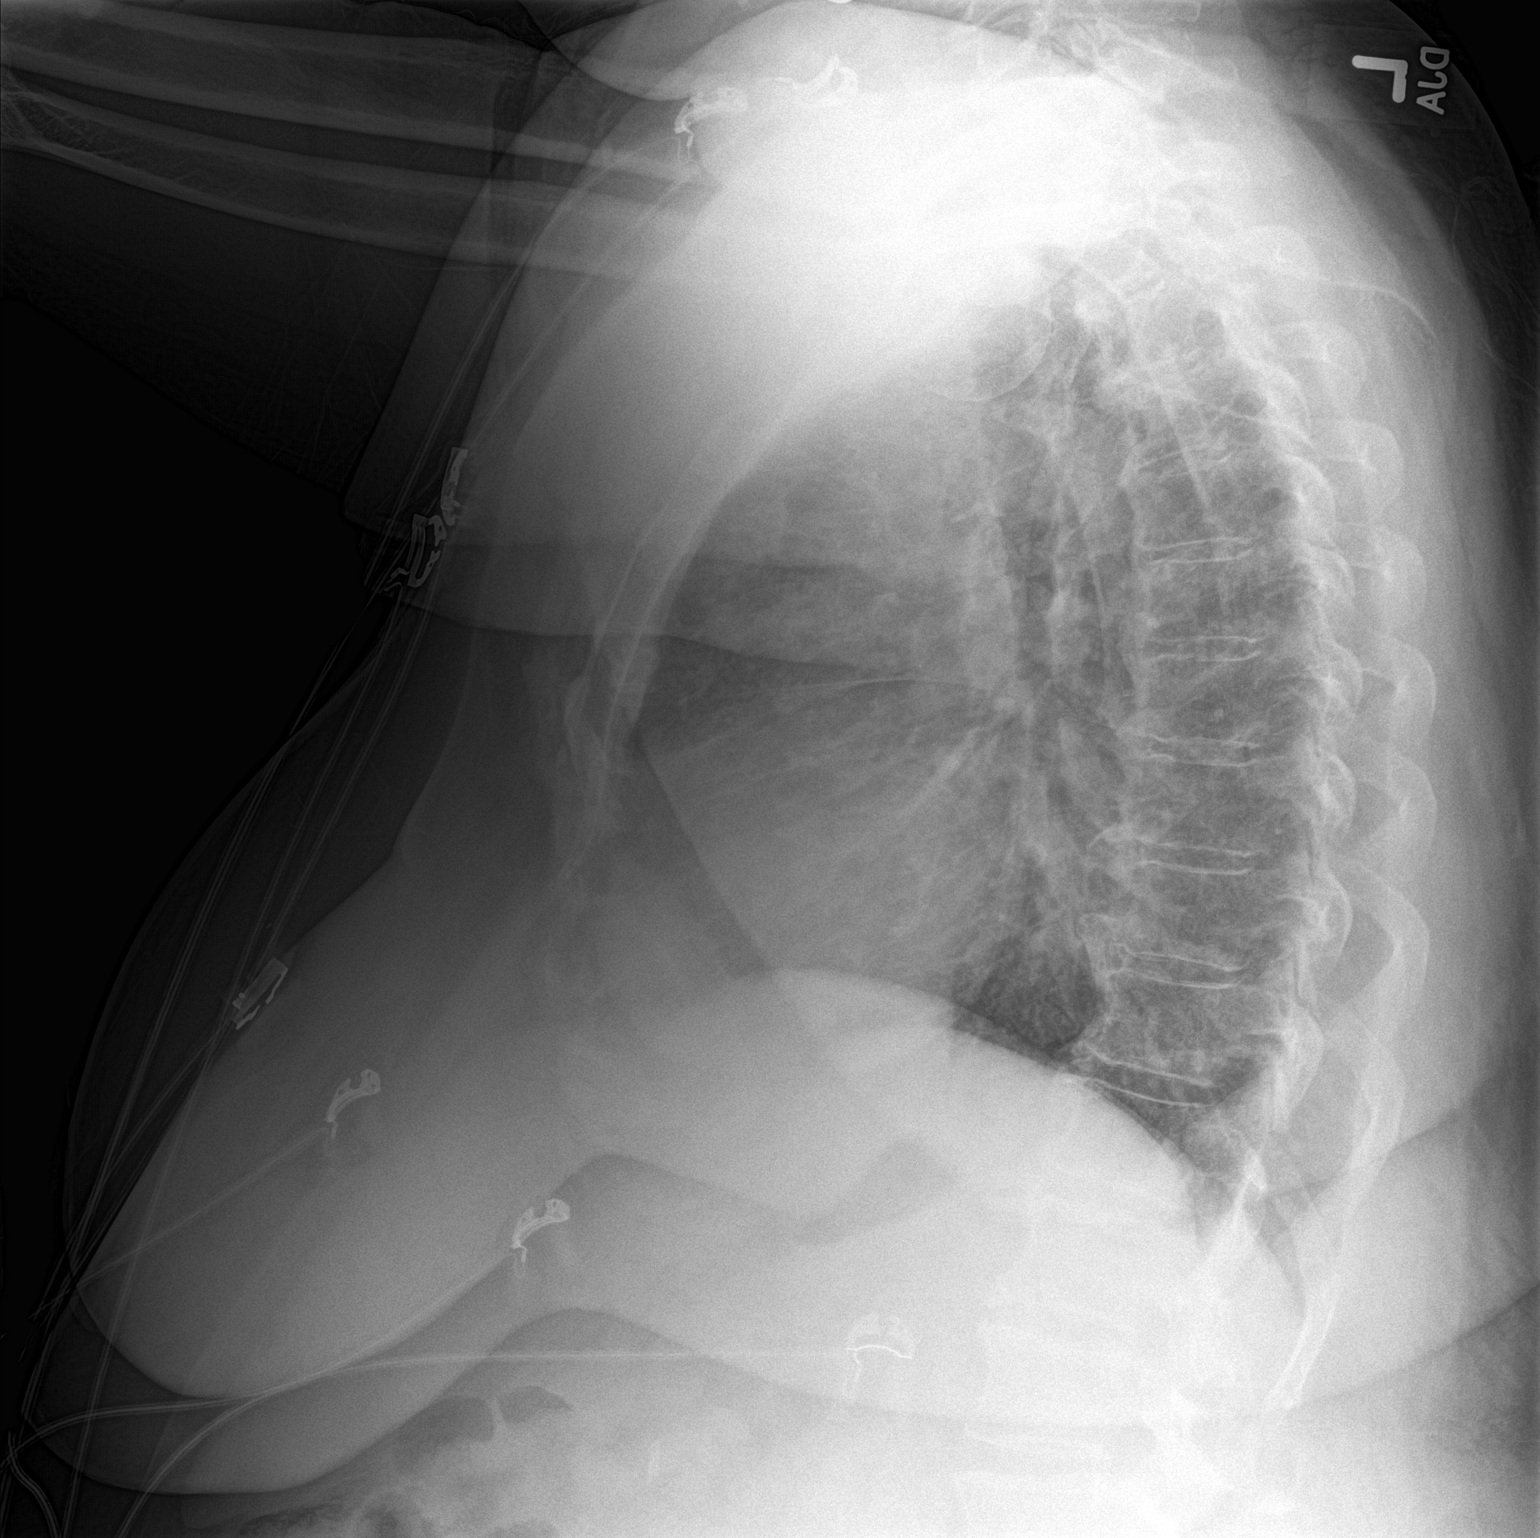

[2 of 2 positions shown; findings below may reference images not displayed]

FINDINGS: Trachea is midline. Heart size normal. There maybe airspace disease
in the left lower lobe. Lungs are otherwise clear. No pleural fluid.
Flowing anterior osteophytosis in the thoracic spine.
IMPRESSION: Possible left lower lobe airspace opacification. Pneumonia cannot be
excluded. Consider follow-up PA and lateral views of the chest in
3-4 weeks to ensure resolution, as clinically indicated, as
malignancy cannot be definitively excluded.

## 2016-11-21 IMAGING — CT CT ANGIO CHEST
2 of 6 series · 19 of 46 positions shown · IV contrast (APPLIED)
Comparison: Plain film from earlier in the same day.

CLINICAL DATA: Increasing shortness of breath and productive cough

EXAM:
CT ANGIOGRAPHY CHEST WITH CONTRAST
TECHNIQUE: Multidetector CT imaging of the chest was performed using the
standard protocol during bolus administration of intravenous
contrast. Multiplanar CT image reconstructions and MIPs were
obtained to evaluate the vascular anatomy.
CONTRAST:  75 mL Isovue 370.

[Series 5: thins · axial · 0.79mm/px · z∈[-731,-469]mm · 17 of 288 slices shown]
[im 13/288  lung]
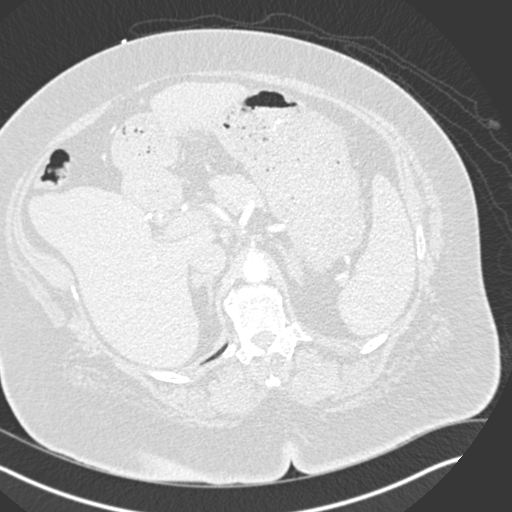
[im 25/288  soft-tissue]
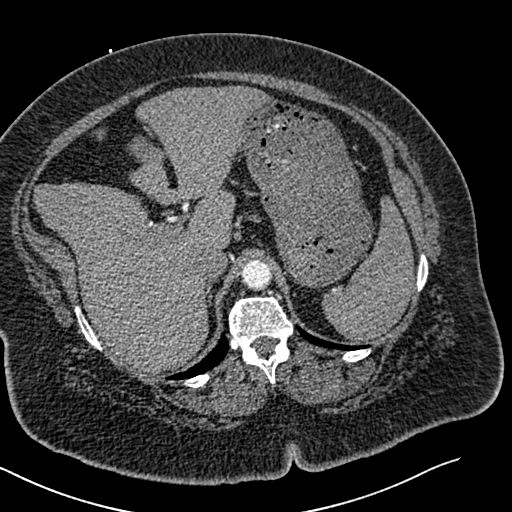
[im 50/288  lung]
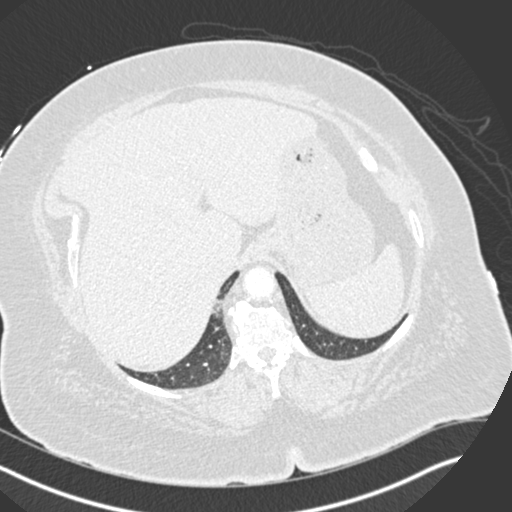
[im 63/288  soft-tissue]
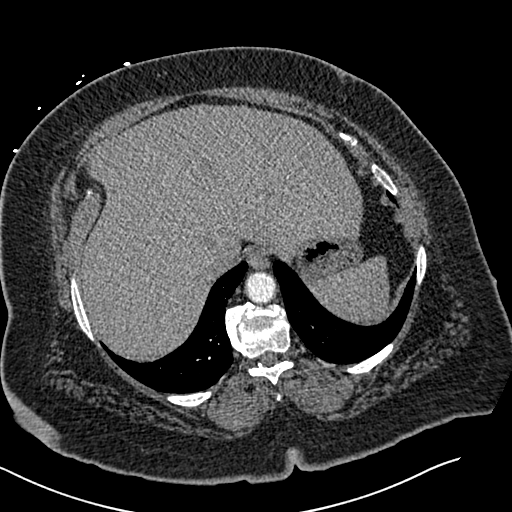
[im 75/288  lung]
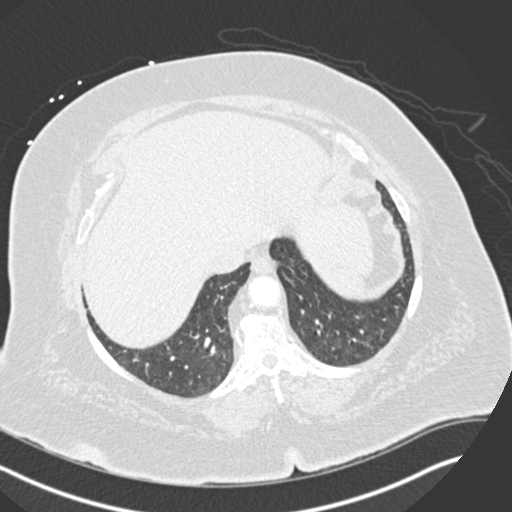
[im 100/288  soft-tissue]
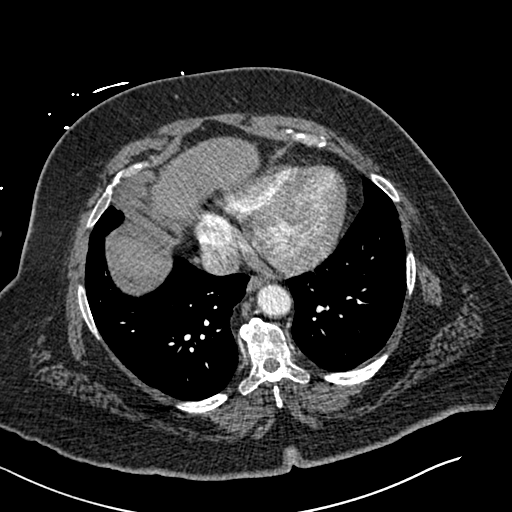
[im 113/288  lung]
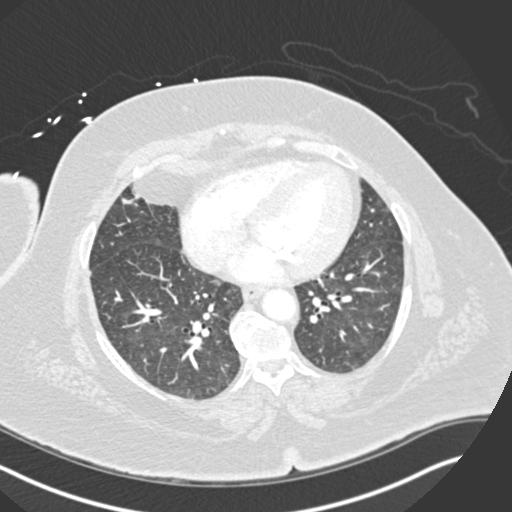
[im 125/288  soft-tissue]
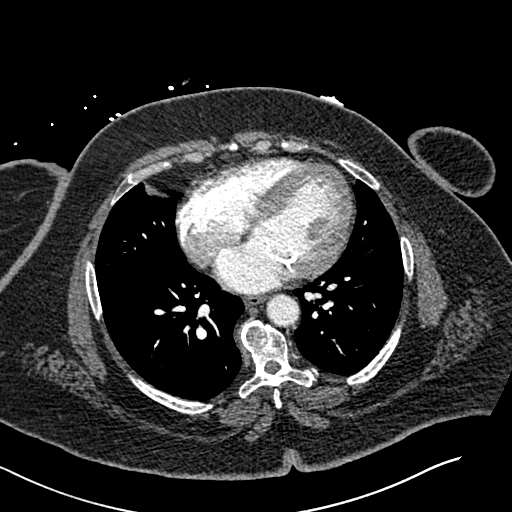
[im 150/288  lung]
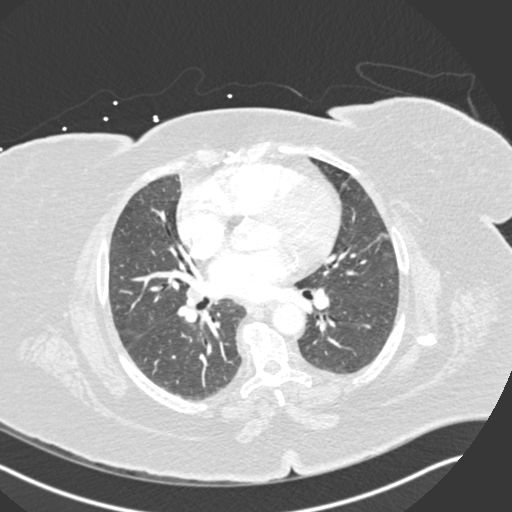
[im 163/288  soft-tissue]
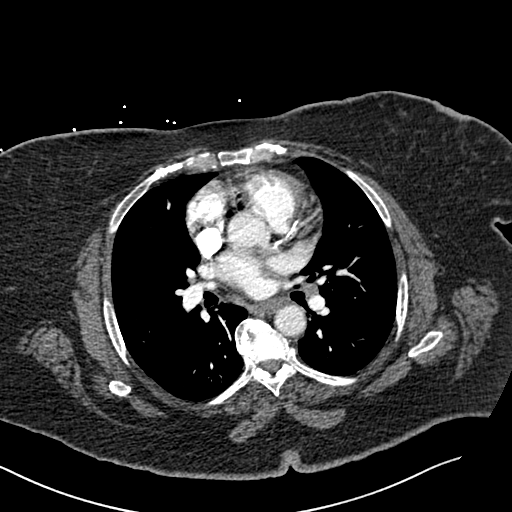
[im 175/288  lung]
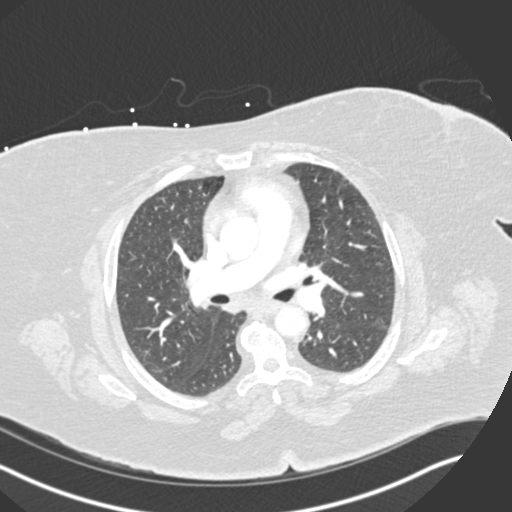
[im 188/288  soft-tissue]
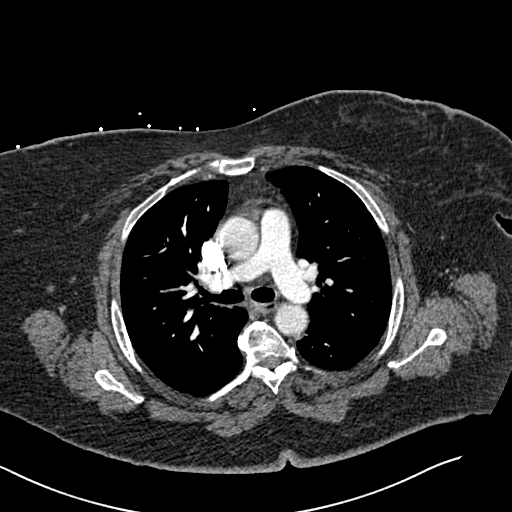
[im 213/288  lung]
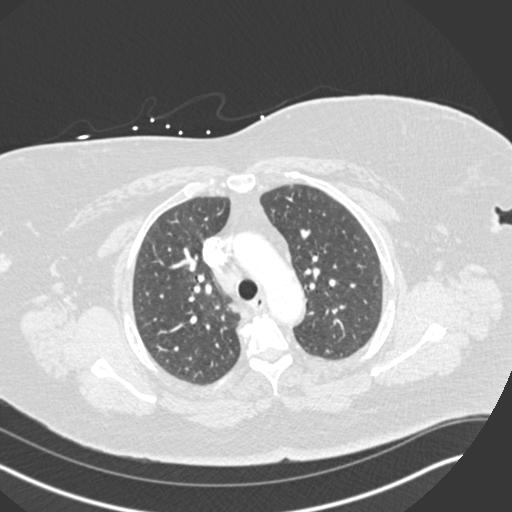
[im 225/288  soft-tissue]
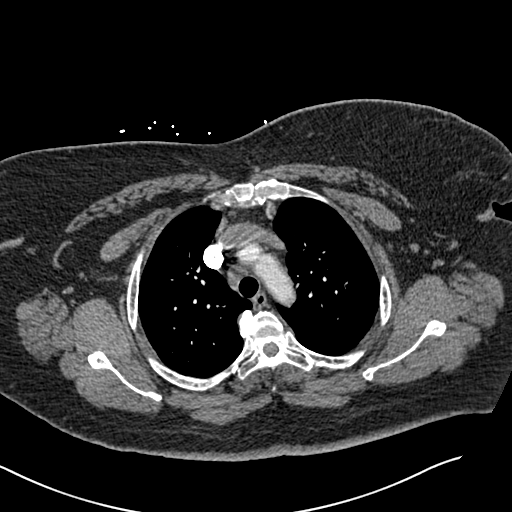
[im 238/288  lung]
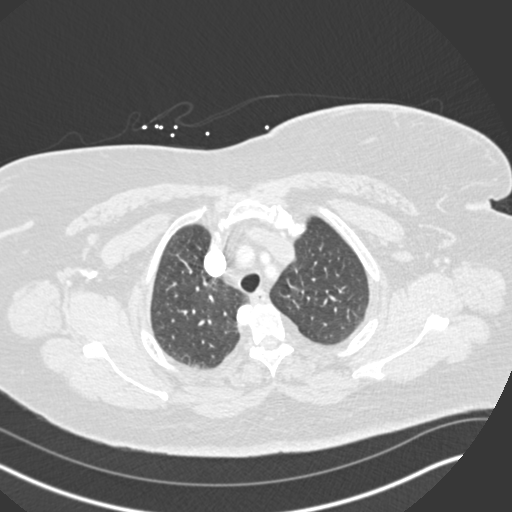
[im 263/288  soft-tissue]
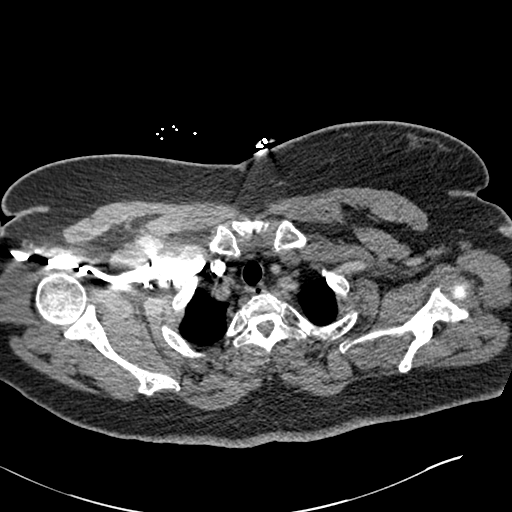
[im 275/288  lung]
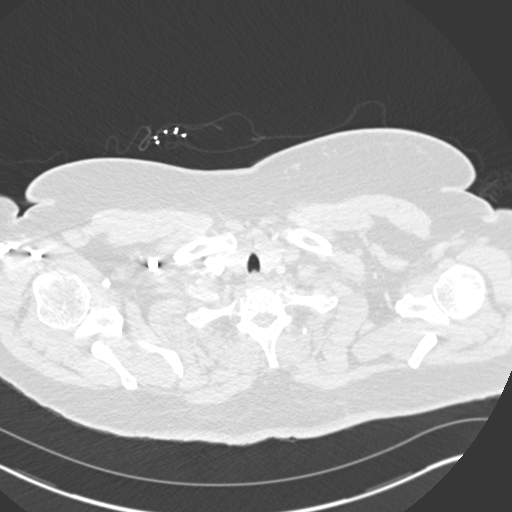

[Series 7: coronal mpr · coronal · 0.56mm/px · 2 of 107 slices shown]
[im 36/107  soft-tissue]
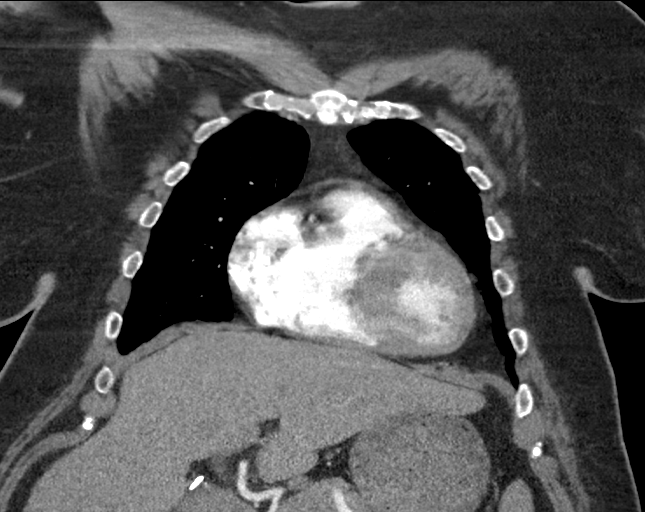
[im 71/107  soft-tissue]
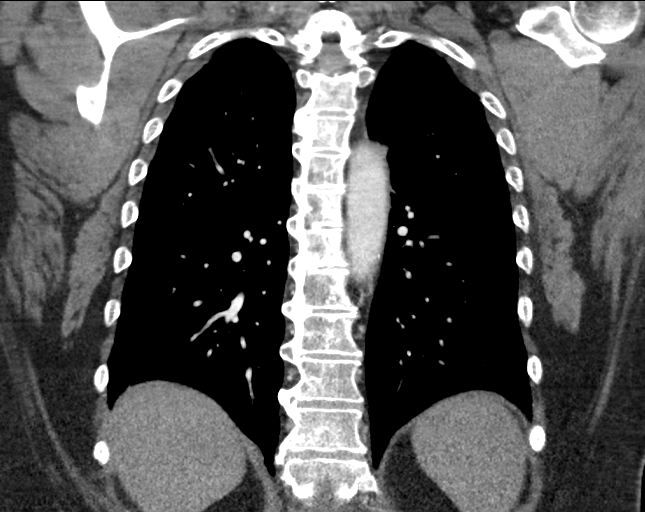

[19 of 46 positions shown; findings below may reference images not displayed]

FINDINGS: Cardiovascular: Aortic calcifications are noted without aneurysmal
dilatation or dissection. Coronary calcifications are noted. The
pulmonary artery is well visualized and demonstrates a normal
branching pattern. No filling defects to suggest pulmonary emboli
are noted.

Mediastinum/Nodes: Thoracic inlet is within normal limits. Scattered
mediastinal lymph nodes are noted which are not significant by size
criteria. Multiple small hilar lymph nodes are noted as well.

Lungs/Pleura: Lungs are clear. No pleural effusion or pneumothorax.

Upper Abdomen: No acute abnormality.

Musculoskeletal: Degenerative changes of the thoracic spine are
noted.

Review of the MIP images confirms the above findings.
IMPRESSION: No evidence of pulmonary emboli.

No acute abnormality seen.

## 2016-12-18 ENCOUNTER — Inpatient Hospital Stay: Payer: Medicaid Other

## 2016-12-18 ENCOUNTER — Inpatient Hospital Stay
Admission: EM | Admit: 2016-12-18 | Discharge: 2016-12-26 | DRG: 871 | Disposition: A | Discharge status: Home / Self Care | Payer: Medicaid Other | Attending: Internal Medicine | Admitting: Internal Medicine

## 2016-12-18 ENCOUNTER — Emergency Department: Payer: Medicaid Other

## 2016-12-18 ENCOUNTER — Other Ambulatory Visit: Payer: Self-pay

## 2016-12-18 ENCOUNTER — Encounter: Payer: Self-pay | Admitting: Internal Medicine

## 2016-12-18 DIAGNOSIS — Z452 Encounter for adjustment and management of vascular access device: Secondary | ICD-10-CM

## 2016-12-18 DIAGNOSIS — E869 Volume depletion, unspecified: Secondary | ICD-10-CM | POA: Diagnosis present

## 2016-12-18 DIAGNOSIS — I050 Rheumatic mitral stenosis: Secondary | ICD-10-CM | POA: Diagnosis present

## 2016-12-18 DIAGNOSIS — Z8619 Personal history of other infectious and parasitic diseases: Secondary | ICD-10-CM

## 2016-12-18 DIAGNOSIS — J440 Chronic obstructive pulmonary disease with acute lower respiratory infection: Secondary | ICD-10-CM | POA: Diagnosis present

## 2016-12-18 DIAGNOSIS — H5704 Mydriasis: Secondary | ICD-10-CM | POA: Diagnosis present

## 2016-12-18 DIAGNOSIS — E1110 Type 2 diabetes mellitus with ketoacidosis without coma: Secondary | ICD-10-CM | POA: Diagnosis present

## 2016-12-18 DIAGNOSIS — J9601 Acute respiratory failure with hypoxia: Secondary | ICD-10-CM | POA: Diagnosis present

## 2016-12-18 DIAGNOSIS — I248 Other forms of acute ischemic heart disease: Secondary | ICD-10-CM | POA: Diagnosis present

## 2016-12-18 DIAGNOSIS — N170 Acute kidney failure with tubular necrosis: Secondary | ICD-10-CM | POA: Diagnosis present

## 2016-12-18 DIAGNOSIS — I161 Hypertensive emergency: Secondary | ICD-10-CM | POA: Diagnosis present

## 2016-12-18 DIAGNOSIS — I509 Heart failure, unspecified: Secondary | ICD-10-CM | POA: Diagnosis not present

## 2016-12-18 DIAGNOSIS — R6521 Severe sepsis with septic shock: Secondary | ICD-10-CM | POA: Diagnosis present

## 2016-12-18 DIAGNOSIS — G8929 Other chronic pain: Secondary | ICD-10-CM | POA: Diagnosis present

## 2016-12-18 DIAGNOSIS — F17210 Nicotine dependence, cigarettes, uncomplicated: Secondary | ICD-10-CM | POA: Diagnosis present

## 2016-12-18 DIAGNOSIS — E1122 Type 2 diabetes mellitus with diabetic chronic kidney disease: Secondary | ICD-10-CM | POA: Diagnosis present

## 2016-12-18 DIAGNOSIS — K254 Chronic or unspecified gastric ulcer with hemorrhage: Secondary | ICD-10-CM | POA: Diagnosis present

## 2016-12-18 DIAGNOSIS — J9600 Acute respiratory failure, unspecified whether with hypoxia or hypercapnia: Secondary | ICD-10-CM | POA: Diagnosis present

## 2016-12-18 DIAGNOSIS — M545 Low back pain: Secondary | ICD-10-CM | POA: Diagnosis present

## 2016-12-18 DIAGNOSIS — J189 Pneumonia, unspecified organism: Secondary | ICD-10-CM | POA: Diagnosis present

## 2016-12-18 DIAGNOSIS — I214 Non-ST elevation (NSTEMI) myocardial infarction: Secondary | ICD-10-CM | POA: Diagnosis present

## 2016-12-18 DIAGNOSIS — Z23 Encounter for immunization: Secondary | ICD-10-CM | POA: Diagnosis not present

## 2016-12-18 DIAGNOSIS — E876 Hypokalemia: Secondary | ICD-10-CM | POA: Diagnosis present

## 2016-12-18 DIAGNOSIS — K7460 Unspecified cirrhosis of liver: Secondary | ICD-10-CM | POA: Diagnosis present

## 2016-12-18 DIAGNOSIS — I43 Cardiomyopathy in diseases classified elsewhere: Secondary | ICD-10-CM | POA: Diagnosis present

## 2016-12-18 DIAGNOSIS — K922 Gastrointestinal hemorrhage, unspecified: Secondary | ICD-10-CM | POA: Diagnosis not present

## 2016-12-18 DIAGNOSIS — A409 Streptococcal sepsis, unspecified: Secondary | ICD-10-CM | POA: Diagnosis present

## 2016-12-18 DIAGNOSIS — Z9114 Patient's other noncompliance with medication regimen: Secondary | ICD-10-CM

## 2016-12-18 DIAGNOSIS — G4733 Obstructive sleep apnea (adult) (pediatric): Secondary | ICD-10-CM | POA: Diagnosis present

## 2016-12-18 DIAGNOSIS — I2510 Atherosclerotic heart disease of native coronary artery without angina pectoris: Secondary | ICD-10-CM | POA: Diagnosis present

## 2016-12-18 DIAGNOSIS — I130 Hypertensive heart and chronic kidney disease with heart failure and stage 1 through stage 4 chronic kidney disease, or unspecified chronic kidney disease: Secondary | ICD-10-CM | POA: Diagnosis present

## 2016-12-18 DIAGNOSIS — K920 Hematemesis: Secondary | ICD-10-CM | POA: Diagnosis present

## 2016-12-18 DIAGNOSIS — J9690 Respiratory failure, unspecified, unspecified whether with hypoxia or hypercapnia: Secondary | ICD-10-CM

## 2016-12-18 DIAGNOSIS — Z888 Allergy status to other drugs, medicaments and biological substances status: Secondary | ICD-10-CM

## 2016-12-18 DIAGNOSIS — J690 Pneumonitis due to inhalation of food and vomit: Secondary | ICD-10-CM | POA: Diagnosis present

## 2016-12-18 DIAGNOSIS — Z79899 Other long term (current) drug therapy: Secondary | ICD-10-CM

## 2016-12-18 DIAGNOSIS — B954 Other streptococcus as the cause of diseases classified elsewhere: Secondary | ICD-10-CM | POA: Diagnosis present

## 2016-12-18 DIAGNOSIS — D696 Thrombocytopenia, unspecified: Secondary | ICD-10-CM

## 2016-12-18 DIAGNOSIS — R402433 Glasgow coma scale score 3-8, at hospital admission: Secondary | ICD-10-CM | POA: Diagnosis present

## 2016-12-18 DIAGNOSIS — N183 Chronic kidney disease, stage 3 (moderate): Secondary | ICD-10-CM | POA: Diagnosis present

## 2016-12-18 DIAGNOSIS — Z794 Long term (current) use of insulin: Secondary | ICD-10-CM

## 2016-12-18 DIAGNOSIS — I5033 Acute on chronic diastolic (congestive) heart failure: Secondary | ICD-10-CM | POA: Diagnosis present

## 2016-12-18 DIAGNOSIS — I340 Nonrheumatic mitral (valve) insufficiency: Secondary | ICD-10-CM | POA: Diagnosis not present

## 2016-12-18 DIAGNOSIS — A419 Sepsis, unspecified organism: Secondary | ICD-10-CM | POA: Diagnosis present

## 2016-12-18 DIAGNOSIS — Z7982 Long term (current) use of aspirin: Secondary | ICD-10-CM

## 2016-12-18 DIAGNOSIS — Z91040 Latex allergy status: Secondary | ICD-10-CM

## 2016-12-18 HISTORY — DX: Chronic obstructive pulmonary disease, unspecified: J44.9

## 2016-12-18 LAB — CBC
HCT: 42.6 % (ref 35.0–47.0)
HCT: 34.2 % — ABNORMAL LOW (ref 35.0–47.0)
HEMATOCRIT: 38 % (ref 35.0–47.0)
HEMOGLOBIN: 13.8 g/dL (ref 12.0–16.0)
HEMOGLOBIN: 12.4 g/dL (ref 12.0–16.0)
Hemoglobin: 11 g/dL — ABNORMAL LOW (ref 12.0–16.0)
MCH: 27.1 pg (ref 26.0–34.0)
MCH: 27.2 pg (ref 26.0–34.0)
MCH: 26.7 pg (ref 26.0–34.0)
MCHC: 32.3 g/dL (ref 32.0–36.0)
MCHC: 32.5 g/dL (ref 32.0–36.0)
MCHC: 32.1 g/dL (ref 32.0–36.0)
MCV: 83.6 fL (ref 80.0–100.0)
MCV: 83.7 fL (ref 80.0–100.0)
MCV: 83.4 fL (ref 80.0–100.0)
PLATELETS: 114 10*3/uL — AB (ref 150–440)
PLATELETS: 115 10*3/uL — AB (ref 150–440)
Platelets: 131 10*3/uL — ABNORMAL LOW (ref 150–440)
RBC: 5.1 MIL/uL (ref 3.80–5.20)
RBC: 4.54 MIL/uL (ref 3.80–5.20)
RBC: 4.1 MIL/uL (ref 3.80–5.20)
RDW: 14.6 % — AB (ref 11.5–14.5)
RDW: 14.8 % — ABNORMAL HIGH (ref 11.5–14.5)
RDW: 15.3 % — AB (ref 11.5–14.5)
WBC: 18.1 10*3/uL — ABNORMAL HIGH (ref 3.6–11.0)
WBC: 15.6 10*3/uL — ABNORMAL HIGH (ref 3.6–11.0)
WBC: 12.2 10*3/uL — ABNORMAL HIGH (ref 3.6–11.0)

## 2016-12-18 LAB — BASIC METABOLIC PANEL
ANION GAP: 13 (ref 5–15)
BUN: 15 mg/dL (ref 6–20)
CALCIUM: 8.1 mg/dL — AB (ref 8.9–10.3)
CO2: 18 mmol/L — AB (ref 22–32)
CREATININE: 1.28 mg/dL — AB (ref 0.44–1.00)
Chloride: 104 mmol/L (ref 101–111)
GFR calc Af Amer: 51 mL/min — ABNORMAL LOW (ref >60)
GFR, EST NON AFRICAN AMERICAN: 44 mL/min — AB (ref >60)
GLUCOSE: 444 mg/dL — AB (ref 65–99)
Potassium: 4 mmol/L (ref 3.5–5.1)
Sodium: 135 mmol/L (ref 135–145)

## 2016-12-18 LAB — CBC WITH DIFFERENTIAL/PLATELET
BASOS ABS: 0.1 10*3/uL (ref 0–0.1)
BASOS PCT: 1 %
EOS ABS: 0.3 10*3/uL (ref 0–0.7)
Eosinophils Relative: 2 %
HEMATOCRIT: 43.1 % (ref 35.0–47.0)
HEMOGLOBIN: 13.8 g/dL (ref 12.0–16.0)
Lymphocytes Relative: 32 %
Lymphs Abs: 4 10*3/uL — ABNORMAL HIGH (ref 1.0–3.6)
MCH: 27.2 pg (ref 26.0–34.0)
MCHC: 31.9 g/dL — AB (ref 32.0–36.0)
MCV: 85.2 fL (ref 80.0–100.0)
MONOS PCT: 5 %
Monocytes Absolute: 0.6 10*3/uL (ref 0.2–0.9)
NEUTROS PCT: 60 %
Neutro Abs: 7.3 10*3/uL — ABNORMAL HIGH (ref 1.4–6.5)
Platelets: 161 10*3/uL (ref 150–440)
RBC: 5.06 MIL/uL (ref 3.80–5.20)
RDW: 14.7 % — ABNORMAL HIGH (ref 11.5–14.5)
WBC: 12.2 10*3/uL — AB (ref 3.6–11.0)

## 2016-12-18 LAB — HEPARIN LEVEL (UNFRACTIONATED): HEPARIN UNFRACTIONATED: 0.45 [IU]/mL (ref 0.30–0.70)

## 2016-12-18 LAB — BLOOD GAS, ARTERIAL
Acid-base deficit: 8.7 mmol/L — ABNORMAL HIGH (ref 0.0–2.0)
Acid-base deficit: 4.4 mmol/L — ABNORMAL HIGH (ref 0.0–2.0)
Bicarbonate: 16.7 mmol/L — ABNORMAL LOW (ref 20.0–28.0)
Bicarbonate: 20.2 mmol/L (ref 20.0–28.0)
FIO2: 1
FIO2: 0.9
MECHVT: 500 mL
O2 SAT: 89.1 %
O2 Saturation: 99.7 %
PATIENT TEMPERATURE: 37
PCO2 ART: 34 mmHg (ref 32.0–48.0)
PEEP/CPAP: 12 cmH2O
PEEP: 12 cmH2O
PO2 ART: 63 mmHg — AB (ref 83.0–108.0)
PO2 ART: 214 mmHg — AB (ref 83.0–108.0)
Patient temperature: 37
RATE: 18 resp/min
RATE: 18 resp/min
VT: 500 mL
pCO2 arterial: 35 mmHg (ref 32.0–48.0)
pH, Arterial: 7.3 — ABNORMAL LOW (ref 7.350–7.450)
pH, Arterial: 7.37 (ref 7.350–7.450)

## 2016-12-18 LAB — URINALYSIS, COMPLETE (UACMP) WITH MICROSCOPIC
Bacteria, UA: NONE SEEN
Bilirubin Urine: NEGATIVE
Glucose, UA: >=500 mg/dL — AB
Hgb urine dipstick: NEGATIVE
Ketones, ur: NEGATIVE mg/dL
LEUKOCYTES UA: NEGATIVE
Nitrite: NEGATIVE
PH: 6 (ref 5.0–8.0)
Protein, ur: >=300 mg/dL — AB
SPECIFIC GRAVITY, URINE: 1.028 (ref 1.005–1.030)

## 2016-12-18 LAB — GLUCOSE, CAPILLARY
GLUCOSE-CAPILLARY: 491 mg/dL — AB (ref 65–99)
GLUCOSE-CAPILLARY: 375 mg/dL — AB (ref 65–99)
GLUCOSE-CAPILLARY: 280 mg/dL — AB (ref 65–99)
GLUCOSE-CAPILLARY: 309 mg/dL — AB (ref 65–99)
GLUCOSE-CAPILLARY: 256 mg/dL — AB (ref 65–99)
GLUCOSE-CAPILLARY: 209 mg/dL — AB (ref 65–99)
GLUCOSE-CAPILLARY: 102 mg/dL — AB (ref 65–99)
Glucose-Capillary: 512 mg/dL (ref 65–99)
Glucose-Capillary: 423 mg/dL — ABNORMAL HIGH (ref 65–99)
Glucose-Capillary: 154 mg/dL — ABNORMAL HIGH (ref 65–99)
Glucose-Capillary: 130 mg/dL — ABNORMAL HIGH (ref 65–99)
Glucose-Capillary: 120 mg/dL — ABNORMAL HIGH (ref 65–99)

## 2016-12-18 LAB — TROPONIN I
TROPONIN I: 0.04 ng/mL — AB (ref <0.03)
TROPONIN I: 0.85 ng/mL — AB (ref <0.03)
Troponin I: 8.86 ng/mL (ref <0.03)
Troponin I: 17.38 ng/mL (ref <0.03)

## 2016-12-18 LAB — URINALYSIS, ROUTINE W REFLEX MICROSCOPIC
BACTERIA UA: NONE SEEN
Bilirubin Urine: NEGATIVE
Glucose, UA: >=500 mg/dL — AB
Ketones, ur: NEGATIVE mg/dL
Leukocytes, UA: NEGATIVE
NITRITE: NEGATIVE
Protein, ur: 100 mg/dL — AB
SPECIFIC GRAVITY, URINE: 1.005 (ref 1.005–1.030)
pH: 6 (ref 5.0–8.0)

## 2016-12-18 LAB — COMPREHENSIVE METABOLIC PANEL
ALT: 10 U/L — AB (ref 14–54)
AST: 24 U/L (ref 15–41)
Albumin: 2.9 g/dL — ABNORMAL LOW (ref 3.5–5.0)
Alkaline Phosphatase: 106 U/L (ref 38–126)
Anion gap: 15 (ref 5–15)
BUN: 15 mg/dL (ref 6–20)
CALCIUM: 8.3 mg/dL — AB (ref 8.9–10.3)
CHLORIDE: 102 mmol/L (ref 101–111)
CO2: 17 mmol/L — ABNORMAL LOW (ref 22–32)
CREATININE: 1.27 mg/dL — AB (ref 0.44–1.00)
GFR, EST AFRICAN AMERICAN: 51 mL/min — AB (ref >60)
GFR, EST NON AFRICAN AMERICAN: 44 mL/min — AB (ref >60)
Glucose, Bld: 518 mg/dL (ref 65–99)
Potassium: 2.6 mmol/L — CL (ref 3.5–5.1)
SODIUM: 134 mmol/L — AB (ref 135–145)
Total Bilirubin: 0.4 mg/dL (ref 0.3–1.2)
Total Protein: 7.6 g/dL (ref 6.5–8.1)

## 2016-12-18 LAB — PROTIME-INR
INR: 0.98
INR: 1.07
PROTHROMBIN TIME: 12.9 s (ref 11.4–15.2)
Prothrombin Time: 13.8 seconds (ref 11.4–15.2)

## 2016-12-18 LAB — MRSA PCR SCREENING: MRSA BY PCR: POSITIVE — AB

## 2016-12-18 LAB — APTT: aPTT: 29 seconds (ref 24–36)

## 2016-12-18 LAB — TRIGLYCERIDES: TRIGLYCERIDES: 178 mg/dL — AB (ref <150)

## 2016-12-18 LAB — SALICYLATE LEVEL

## 2016-12-18 LAB — ACETAMINOPHEN LEVEL

## 2016-12-18 LAB — LACTIC ACID, PLASMA
LACTIC ACID, VENOUS: 2.8 mmol/L — AB (ref 0.5–1.9)
Lactic Acid, Venous: 5 mmol/L (ref 0.5–1.9)

## 2016-12-18 LAB — BRAIN NATRIURETIC PEPTIDE: B NATRIURETIC PEPTIDE 5: 472 pg/mL — AB (ref 0.0–100.0)

## 2016-12-18 MED ORDER — PIPERACILLIN-TAZOBACTAM 3.375 G IVPB 30 MIN
3.3750 g | Freq: Once | INTRAVENOUS | Status: AC
  Administered 2016-12-18: 3.375 g via INTRAVENOUS
  Filled 2016-12-18: qty 50

## 2016-12-18 MED ORDER — DOCUSATE SODIUM 100 MG PO CAPS: 100.0000 mg | ORAL_CAPSULE | Freq: Two times a day (BID) | ORAL | Status: DC | PRN

## 2016-12-18 MED ORDER — NITROGLYCERIN IN D5W 200-5 MCG/ML-% IV SOLN: 0.0000 ug/min | Freq: Once | INTRAVENOUS | Status: DC

## 2016-12-18 MED ORDER — IPRATROPIUM-ALBUTEROL 0.5-2.5 (3) MG/3ML IN SOLN
3.0000 mL | Freq: Four times a day (QID) | RESPIRATORY_TRACT | Status: DC
  Administered 2016-12-18 – 2016-12-20 (×7): 3 mL via RESPIRATORY_TRACT
  Filled 2016-12-18 (×8): qty 3

## 2016-12-18 MED ORDER — VANCOMYCIN HCL IN DEXTROSE 1-5 GM/200ML-% IV SOLN
1000.0000 mg | Freq: Once | INTRAVENOUS | Status: AC
  Administered 2016-12-18: 1000 mg via INTRAVENOUS
  Filled 2016-12-18: qty 200

## 2016-12-18 MED ORDER — SODIUM CHLORIDE 0.9 % IV BOLUS (SEPSIS): 1000.0000 mL | Freq: Once | INTRAVENOUS | Status: DC

## 2016-12-18 MED ORDER — SODIUM CHLORIDE 0.9 % IV SOLN
INTRAVENOUS | Status: DC
  Administered 2016-12-18: 3.6 [IU]/h via INTRAVENOUS
  Filled 2016-12-18: qty 1

## 2016-12-18 MED ORDER — MUPIROCIN 2 % EX OINT
1.0000 "application " | TOPICAL_OINTMENT | Freq: Two times a day (BID) | CUTANEOUS | Status: AC
  Administered 2016-12-18 – 2016-12-23 (×9): 1 via NASAL
  Filled 2016-12-18: qty 22

## 2016-12-18 MED ORDER — FENTANYL CITRATE (PF) 100 MCG/2ML IJ SOLN
INTRAMUSCULAR | Status: AC
  Filled 2016-12-18: qty 2

## 2016-12-18 MED ORDER — POTASSIUM CHLORIDE 10 MEQ/100ML IV SOLN
10.0000 meq | INTRAVENOUS | Status: AC
  Administered 2016-12-18 (×4): 10 meq via INTRAVENOUS
  Filled 2016-12-18 (×4): qty 100

## 2016-12-18 MED ORDER — ASPIRIN 325 MG PO TABS: 325.0000 mg | ORAL_TABLET | Freq: Every day | ORAL | Status: DC

## 2016-12-18 MED ORDER — ORAL CARE MOUTH RINSE: 15.0000 mL | Freq: Four times a day (QID) | OROMUCOSAL | Status: DC

## 2016-12-18 MED ORDER — CHLORHEXIDINE GLUCONATE 0.12% ORAL RINSE (MEDLINE KIT)
15.0000 mL | Freq: Two times a day (BID) | OROMUCOSAL | Status: DC
  Administered 2016-12-18 – 2016-12-20 (×4): 15 mL via OROMUCOSAL

## 2016-12-18 MED ORDER — CHLORHEXIDINE GLUCONATE 0.12% ORAL RINSE (MEDLINE KIT): 15.0000 mL | Freq: Two times a day (BID) | OROMUCOSAL | Status: DC

## 2016-12-18 MED ORDER — SODIUM CHLORIDE 0.9 % IV BOLUS (SEPSIS)
500.0000 mL | Freq: Once | INTRAVENOUS | Status: AC
  Administered 2016-12-18: 500 mL via INTRAVENOUS

## 2016-12-18 MED ORDER — CHLORHEXIDINE GLUCONATE CLOTH 2 % EX PADS
6.0000 | MEDICATED_PAD | Freq: Every day | CUTANEOUS | Status: AC
  Administered 2016-12-19 – 2016-12-23 (×4): 6 via TOPICAL

## 2016-12-18 MED ORDER — PROPOFOL 1000 MG/100ML IV EMUL
INTRAVENOUS | Status: AC
  Administered 2016-12-18: 5 ug/kg/min via INTRAVENOUS
  Filled 2016-12-18: qty 100

## 2016-12-18 MED ORDER — METOPROLOL TARTRATE 25 MG PO TABS: 12.5000 mg | ORAL_TABLET | Freq: Two times a day (BID) | ORAL | Status: DC

## 2016-12-18 MED ORDER — SODIUM CHLORIDE 0.9 % IV SOLN: INTRAVENOUS | Status: DC

## 2016-12-18 MED ORDER — SODIUM CHLORIDE 0.9 % IV SOLN
3.0000 g | Freq: Four times a day (QID) | INTRAVENOUS | Status: DC
  Filled 2016-12-18 (×2): qty 3

## 2016-12-18 MED ORDER — ASPIRIN 81 MG PO CHEW: 81.0000 mg | CHEWABLE_TABLET | ORAL | Status: DC

## 2016-12-18 MED ORDER — NITROGLYCERIN 2 % TD OINT
TOPICAL_OINTMENT | TRANSDERMAL | Status: AC
  Administered 2016-12-18: 1 [in_us] via TOPICAL
  Filled 2016-12-18: qty 1

## 2016-12-18 MED ORDER — PROPOFOL 1000 MG/100ML IV EMUL
5.0000 ug/kg/min | INTRAVENOUS | Status: DC
  Administered 2016-12-18: 35 ug/kg/min via INTRAVENOUS
  Administered 2016-12-18: 40 ug/kg/min via INTRAVENOUS
  Administered 2016-12-19 (×2): 45 ug/kg/min via INTRAVENOUS
  Administered 2016-12-19: 20 ug/kg/min via INTRAVENOUS
  Filled 2016-12-18 (×4): qty 100

## 2016-12-18 MED ORDER — ETOMIDATE 2 MG/ML IV SOLN
0.3000 mg/kg | Freq: Once | INTRAVENOUS | Status: AC
Start: 2016-12-18 — End: 2016-12-18
  Administered 2016-12-18: 20 mg via INTRAVENOUS

## 2016-12-18 MED ORDER — SODIUM CHLORIDE 0.9 % IV BOLUS (SEPSIS)
1000.0000 mL | Freq: Once | INTRAVENOUS | Status: AC
  Administered 2016-12-18: 1000 mL via INTRAVENOUS

## 2016-12-18 MED ORDER — HEPARIN SODIUM (PORCINE) 5000 UNIT/ML IJ SOLN: 5000.0000 [IU] | Freq: Three times a day (TID) | INTRAMUSCULAR | Status: DC

## 2016-12-18 MED ORDER — FENTANYL CITRATE (PF) 100 MCG/2ML IJ SOLN
50.0000 ug | INTRAMUSCULAR | Status: DC | PRN
Start: 2016-12-18 — End: 2016-12-20
  Administered 2016-12-18 (×2): 50 ug via INTRAVENOUS
  Filled 2016-12-18: qty 2

## 2016-12-18 MED ORDER — ORAL CARE MOUTH RINSE
15.0000 mL | OROMUCOSAL | Status: DC
  Administered 2016-12-18 – 2016-12-20 (×14): 15 mL via OROMUCOSAL

## 2016-12-18 MED ORDER — DEXTROSE-NACL 5-0.45 % IV SOLN: INTRAVENOUS | Status: DC

## 2016-12-18 MED ORDER — PANTOPRAZOLE SODIUM 40 MG IV SOLR
40.0000 mg | INTRAVENOUS | Status: DC
  Administered 2016-12-18: 40 mg via INTRAVENOUS
  Filled 2016-12-18: qty 40

## 2016-12-18 MED ORDER — POTASSIUM CHLORIDE IN NACL 40-0.9 MEQ/L-% IV SOLN
INTRAVENOUS | Status: DC
  Administered 2016-12-18 – 2016-12-20 (×5): 125 mL/h via INTRAVENOUS
  Filled 2016-12-18 (×10): qty 1000

## 2016-12-18 MED ORDER — HEPARIN (PORCINE) IN NACL 100-0.45 UNIT/ML-% IJ SOLN
1100.0000 [IU]/h | INTRAMUSCULAR | Status: DC
  Administered 2016-12-18: 1100 [IU]/h via INTRAVENOUS
  Filled 2016-12-18 (×2): qty 250

## 2016-12-18 MED ORDER — HEPARIN BOLUS VIA INFUSION
4000.0000 [IU] | Freq: Once | INTRAVENOUS | Status: AC
  Administered 2016-12-18: 4000 [IU] via INTRAVENOUS
  Filled 2016-12-18: qty 4000

## 2016-12-18 MED ORDER — ATORVASTATIN CALCIUM 20 MG PO TABS: 80.0000 mg | ORAL_TABLET | Freq: Every day | ORAL | Status: DC

## 2016-12-18 MED ORDER — ATORVASTATIN CALCIUM 20 MG PO TABS
80.0000 mg | ORAL_TABLET | Freq: Every day | ORAL | Status: DC
  Administered 2016-12-18 – 2016-12-25 (×7): 80 mg
  Filled 2016-12-18 (×7): qty 4

## 2016-12-18 MED ORDER — ROCURONIUM BROMIDE 50 MG/5ML IV SOLN
1.0000 mg/kg | Freq: Once | INTRAVENOUS | Status: AC
  Administered 2016-12-18: 100 mg via INTRAVENOUS

## 2016-12-18 MED ORDER — INSULIN ASPART 100 UNIT/ML ~~LOC~~ SOLN: 0.0000 [IU] | Freq: Three times a day (TID) | SUBCUTANEOUS | Status: DC

## 2016-12-18 MED ORDER — MORPHINE SULFATE (PF) 2 MG/ML IV SOLN: 1.0000 mg | INTRAVENOUS | Status: DC | PRN

## 2016-12-18 MED ORDER — SODIUM CHLORIDE 0.9 % IV SOLN: INTRAVENOUS | Status: AC

## 2016-12-18 MED ORDER — PROPOFOL 1000 MG/100ML IV EMUL
INTRAVENOUS | Status: AC
  Filled 2016-12-18: qty 100

## 2016-12-18 MED ORDER — ASPIRIN 325 MG PO TABS
325.0000 mg | ORAL_TABLET | Freq: Once | ORAL | Status: AC
  Administered 2016-12-18: 325 mg via ORAL
  Filled 2016-12-18: qty 1

## 2016-12-18 MED ORDER — NOREPINEPHRINE BITARTRATE 1 MG/ML IV SOLN
0.0000 ug/min | INTRAVENOUS | Status: DC
  Administered 2016-12-18: 2 ug/min via INTRAVENOUS
  Filled 2016-12-18 (×2): qty 16

## 2016-12-18 MED ORDER — FUROSEMIDE 10 MG/ML IJ SOLN
80.0000 mg | Freq: Once | INTRAMUSCULAR | Status: AC
  Administered 2016-12-18: 80 mg via INTRAVENOUS
  Filled 2016-12-18: qty 8

## 2016-12-18 MED ORDER — DEXTROSE 5 % IV SOLN
2.0000 g | Freq: Three times a day (TID) | INTRAVENOUS | Status: DC
  Administered 2016-12-18 (×2): 2 g via INTRAVENOUS
  Filled 2016-12-18 (×4): qty 2

## 2016-12-18 MED ORDER — PROPOFOL 1000 MG/100ML IV EMUL
5.0000 ug/kg/min | Freq: Once | INTRAVENOUS | Status: DC
  Administered 2016-12-18: 5 ug/kg/min via INTRAVENOUS

## 2016-12-18 MED ORDER — VANCOMYCIN HCL IN DEXTROSE 1-5 GM/200ML-% IV SOLN
1000.0000 mg | Freq: Two times a day (BID) | INTRAVENOUS | Status: DC
  Administered 2016-12-18 – 2016-12-19 (×2): 1000 mg via INTRAVENOUS
  Filled 2016-12-18 (×3): qty 200

## 2016-12-18 NOTE — Progress Notes (Signed)
Pt noted to have latex allergy. ED called to determine if foley that was placed is latex free. Per charge nurse, the foley that was placed in ED is NOT latex free. Foley removed and latex-free foley placed.

## 2016-12-18 NOTE — Progress Notes (Addendum)
Pt's troponin came back critically elevated at 17.38. Elink notified, awaiting call back.   Update 1950: Still waiting for Elink to call back. Oncoming nurse notified of critical lab value and agreed to follow-up with critical care NP or Elink.   Update 2000: Critical care NP notified of elevated troponin, low BP, and that she had a foley catheter with latex in place earlier today. During PM chart review it was noted that there is no order for OG tube to be placed on low intermittent suction. Critical care NP notofied. Per Tanya Nakayama, NP pt is to receive 1L bolus of NS and her OG should be hooked to low intermittent suction. No other orders received.

## 2016-12-18 NOTE — ED Notes (Signed)
Date and time results received: 12/18/16 0946 (use smartphrase ".now" to insert current time)  Test: Potassium Critical Value: 2.6  Name of Provider Notified: Dr. Burlene Arnt  Orders Received? Or Actions Taken?: Notified

## 2016-12-18 NOTE — Consult Note (Signed)
Pharmacy Antibiotic Note  [name] is a 61 y.o. female admitted on 12/18/2016 with possible aspiration PNA/CHF exacerbation.  Pharmacy has been consulted for unasyn and vancomycin dosing.  Plan: Vancomycin 1g in the ED. Give next dose in 6 hours for stacked dosing.  Vancomycin 1g IV every 12 hours.  Goal trough 15-20 mcg/mL.  Trough prior to the 5th dose unasyn 3g q 6 hours- start 6 hours after zosyn dose Follow up on MRSA PCR- consider d/c vanc if neg  Height: 5' 9"  (175.3 cm) Weight: 236 lb (107 kg) IBW/kg (Calculated) : 66.2  Temp (24hrs), Avg:97.6 F (36.4 C), Min:89.5 F (31.9 C), Max:99 F (37.2 C)  Recent Labs  Lab 12/18/16 0849 12/18/16 0850  WBC 12.2*  --   CREATININE 1.27*  --   LATICACIDVEN  --  5.0*    Estimated Creatinine Clearance: 59.8 mL/min (A) (by C-G formula based on SCr of 1.27 mg/dL (H)).    Allergies  Allergen Reactions  . Latex Anaphylaxis and Rash  . Gabapentin Nausea And Vomiting    Antimicrobials this admission: zosyn one dose  unasyn 12/2>>  vancomycin 12/2 >>   Dose adjustments this admission:   Microbiology results: 12/2 BCx:  12/2 MRSA PCR: needs to be collected  Thank you for allowing pharmacy to be a part of this patient's care.  Ramond Dial, Pharm.D, BCPS Clinical Pharmacist 12/18/2016 10:12 AM

## 2016-12-18 NOTE — Progress Notes (Addendum)
ANTIBIOTIC CONSULT NOTE - INITIAL  Pharmacy Consult for cefepime Indication: sepsis  Allergies  Allergen Reactions  . Latex Anaphylaxis and Rash  . Gabapentin Nausea And Vomiting    Patient Measurements: Height: 5' 9"  (175.3 cm) Weight: 209 lb 14.1 oz (95.2 kg) IBW/kg (Calculated) : 66.2 Adjusted Body Weight:   Vital Signs: Temp: 97.6 F (36.4 C) (12/02 1228) Temp Source: Axillary (12/02 1228) BP: 130/81 (12/02 1040) Pulse Rate: 100 (12/02 1040) Intake/Output from previous day: No intake/output data recorded. Intake/Output from this shift: No intake/output data recorded.  Labs: Recent Labs    12/18/16 0849 12/18/16 1044  WBC 12.2* 18.1*  HGB 13.8 13.8  PLT 161 114*  CREATININE 1.27* 1.28*   Estimated Creatinine Clearance: 56 mL/min (A) (by C-G formula based on SCr of 1.28 mg/dL (H)). No results for input(s): VANCOTROUGH, VANCOPEAK, VANCORANDOM, GENTTROUGH, GENTPEAK, GENTRANDOM, TOBRATROUGH, TOBRAPEAK, TOBRARND, AMIKACINPEAK, AMIKACINTROU, AMIKACIN in the last 72 hours.   Microbiology: No results found for this or any previous visit (from the past 720 hour(s)).  Medical History: Past Medical History:  Diagnosis Date  . CHF (congestive heart failure) ([place])   . COPD (chronic obstructive pulmonary disease) ([place])   . Diabetes mellitus without complication (HCC)     Medications:  Infusions:  . propofol    . 0.9 % NaCl with KCl 40 mEq / L 125 mL/hr (12/18/16 1205)  . ceFEPime (MAXIPIME) IV    . insulin (NOVOLIN-R) infusion 3.6 Units/hr (12/18/16 1227)  . potassium chloride 10 mEq (12/18/16 1207)  . sodium chloride    . vancomycin     Assessment: 58 yof with PNA/CHF exacerbation - was on Unasyn for question of aspiration PNA, now pharmacy consulted to dose cefepime for sepsis.   Goal of Therapy:  Resolve infection Prevent ADE  Plan:  Cefepime 2 gm IV Q8H  12/3 0100 changed to q 12 hours for decreased renal function.  Laural Benes, Pharm.D.,  BCPS Clinical Pharmacist 12/18/2016,12:33 PM

## 2016-12-18 NOTE — Progress Notes (Signed)
CODE SEPSIS - PHARMACY COMMUNICATION  **Broad Spectrum Antibiotics should be administered within 1 hour of Sepsis diagnosis**  Time Code Sepsis Called/Page Received: 1009  Antibiotics Ordered: zosyn (consults for vanc and unasyn)  Time of 1st antibiotic administration: 0937  Additional action taken by pharmacy: none  If necessary, Name of Provider/Nurse Contacted: none  [name], Pharm.D, BCPS Clinical Pharmacist 12/18/2016  10:11 AM

## 2016-12-18 NOTE — Progress Notes (Signed)
ANTICOAGULATION CONSULT NOTE - Initial Consult  Pharmacy Consult for heparin Indication: chest pain/ACS  Allergies  Allergen Reactions  . Latex Anaphylaxis and Rash  . Gabapentin Nausea And Vomiting    Patient Measurements: Height: 5' 9"  (175.3 cm) Weight: 209 lb 14.1 oz (95.2 kg) IBW/kg (Calculated) : 66.2 Heparin Dosing Weight: 90 kg  Vital Signs: Temp: 97.6 F (36.4 C) (12/02 1228) Temp Source: Axillary (12/02 1228) BP: 130/81 (12/02 1040) Pulse Rate: 100 (12/02 1040)  Labs: Recent Labs    12/18/16 0849 12/18/16 1044  HGB 13.8 13.8  HCT 43.1 42.6  PLT 161 114*  LABPROT 12.9  --   INR 0.98  --   CREATININE 1.27* 1.28*  TROPONINI 0.04* 0.85*    Estimated Creatinine Clearance: 56 mL/min (A) (by C-G formula based on SCr of 1.28 mg/dL (H)).   Medical History: Past Medical History:  Diagnosis Date  . CHF (congestive heart failure) ([place])   . COPD (chronic obstructive pulmonary disease) ([place])   . Diabetes mellitus without complication (HCC)     Medications:  Infusions:  . 0.9 % NaCl with KCl 40 mEq / L 125 mL/hr (12/18/16 1205)  . ceFEPime (MAXIPIME) IV    . heparin    . insulin (NOVOLIN-R) infusion 3.6 Units/hr (12/18/16 1227)  . potassium chloride 10 mEq (12/18/16 1330)  . propofol    . sodium chloride    . vancomycin      Assessment: 6 yof cc respiratory distress, PMH CHF, COPD, DM, tobacco use. Troponin 0.04 - 0.85 -, ECG shows sinus tachycardia, LVH with secondary repolarization abnormality, and prolonged QT interval. Pharmacy consulted to dose UFH for ACS.   Goal of Therapy:  Heparin level 0.3-0.7 units/ml Monitor platelets by anticoagulation protocol: Yes   Plan:  Give 4000 units bolus x 1 Start heparin infusion at 1100 units/hr Check anti-Xa level in 6 hours and daily while on heparin Continue to monitor H&H and platelets  Charlann Lange Zadiel Leyh 12/18/2016,2:18 PM

## 2016-12-18 NOTE — Progress Notes (Signed)
Pt transferred to unit from ED with RT and RN. Pt currently intubated and on continuous sedation. VSS, per MD goal map is 65. Pt appears to be resting comfortably in bed at this time.

## 2016-12-18 NOTE — Progress Notes (Signed)
Pt's troponin came back elevated at 0.85. In addition, pt has uneven pupils with R>L (R pupil is also nonreactive to light). Dr. Humphrey Rolls notified and he stated to obtain stat CT, cardiology consult, and if CT scan is negative to start a heparin drip.

## 2016-12-18 NOTE — ED Notes (Signed)
Date and time results received: 12/18/16 0946 (use smartphrase ".now" to insert current time)  Test: Troponin Critical Value: 0.04  Name of Provider Notified: Dr. Burlene Arnt  Orders Received? Or Actions Taken?: Notified

## 2016-12-18 NOTE — ED Notes (Signed)
Date and time results received: 12/18/16 0946 (use smartphrase ".now" to insert current time)  Test: Glucose Critical Value: 518  Name of Provider Notified: Dr/ Burlene Arnt  Orders Received? Or Actions Taken?:  Notified

## 2016-12-18 NOTE — Progress Notes (Signed)
Pt's troponin came back critically elevated at 8.86 Dr. Ubaldo Glassing notified, no new orders received.

## 2016-12-18 NOTE — ED Notes (Signed)
Date and time results received: 12/18/16 0947 (use smartphrase ".now" to insert current time)  Test: Lactic Acid Critical Value: 5.0  Name of Provider Notified: Dr Burlene Arnt  Orders Received? Or Actions Taken?: Notified

## 2016-12-18 NOTE — H&P (Signed)
[facility] [place] NAME: [name]    MR#:  [id number]  DATE OF BIRTH:  1955/10/08  DATE OF ADMISSION:  12/18/2016  PRIMARY CARE PHYSICIAN: [facility]   REQUESTING/REFERRING PHYSICIAN: [facility]  CHIEF COMPLAINT:   Chief Complaint  Patient presents with  . Respiratory Distress    HISTORY OF PRESENT ILLNESS: [name]  is a 61 y.o. female with a known history of CHF, COPD, DM, Smoker- Non compliant to meds and treatment- had SOB for last few days which is worsening. As per husband in room, she feels SOB while lying, had to sit up. Today morning was extremely SOB, so called EMS. Was intubated, and brought to ER- with Hypoxia. She is on vent. Noted to have some Pneumonia vs Edema on x ray. Her BP was 017 systolic on arrival, After nitro paste and vent- dropped to 90 systolic.  Husband denies any fever or sputum.  PAST MEDICAL HISTORY:   Past Medical History:  Diagnosis Date  . CHF (congestive heart failure) ([place])   . COPD (chronic obstructive pulmonary disease) ([place])   . Diabetes mellitus without complication ([place])     PAST SURGICAL HISTORY:  Past Surgical History:  Procedure Laterality Date  . APPENDECTOMY    . CHOLECYSTECTOMY      SOCIAL HISTORY:  Social History   Tobacco Use  . Smoking status: Current Every Day Smoker    Packs/day: 0.25    Types: Cigarettes  . Smokeless tobacco: Never Used  Substance Use Topics  . Alcohol use: No    FAMILY HISTORY:  Family History  Problem Relation Age of Onset  . Hypertension Mother     DRUG ALLERGIES:  Allergies  Allergen Reactions  . Latex Anaphylaxis and Rash  . Gabapentin Nausea And Vomiting    REVIEW OF SYSTEMS:   Pt is intubated.  MEDICATIONS AT HOME:  Prior to Admission medications   Medication Sig Start Date End Date Taking? Authorizing Provider  aspirin 81 MG chewable tablet Chew 81 mg by mouth every morning.    [provider]  carvedilol (COREG) 12.5 MG tablet Take 12.5 mg by mouth 2 (two) times daily. 08/05/15   [provider]  fluticasone (FLONASE) 50 MCG/ACT nasal spray Place 2 sprays into the nose daily as needed for rhinitis. 06/03/15   [provider]  furosemide (LASIX) 40 MG tablet Take 40 mg by mouth daily.  08/17/15 08/16/16  [provider]  hydrochlorothiazide (MICROZIDE) 12.5 MG capsule Take 12.5 mg by mouth every evening. 08/05/15   [provider]  insulin aspart (NOVOLOG) 100 UNIT/ML FlexPen Inject 14 Units into the skin See admin instructions. Inject 12 units subcutaneously 3 times daily with meals. And as needed. 10/29/15 10/28/16  Bettey Costa, MD  insulin regular human CONCENTRATED (HUMULIN R) 500 UNIT/ML injection Inject 0.04 mLs (20 Units total) into the skin 2 (two) times daily with a meal. Pt on sliding scale Patient taking differently: Inject 12 Units into the skin 2 (two) times daily. 12 units morning 12 units night 08/30/15   Fritzi Mandes, MD  loratadine (CLARITIN) 10 MG tablet Take 10 mg by mouth at bedtime as needed for allergies. 05/19/15 05/18/16  [provider]  metFORMIN (GLUCOPHAGE) 1000 MG tablet Take 1,000 mg by mouth 2 (two) times daily. 07/30/14   [provider]  oxyCODONE-acetaminophen (PERCOCET/ROXICET) 5-325 MG tablet Take 1 tablet by mouth 3 (three) times daily as needed. 12/07/15  Demetrios Loll, MD  potassium chloride SA (K-DUR,KLOR-CON) 20 MEQ tablet Take 20 mEq by mouth daily.    [provider]  PROAIR HFA 108 [phone] Base) MCG/ACT inhaler Inhale 2 puffs into the lungs See admin instructions. Inhale 2 puffs by mouth every 4 to 6 hours as needed for shortness of breath/cough. 06/22/15   [provider]  ramipril (ALTACE) 10 MG capsule Take 10 mg by mouth at bedtime. 03/17/15 01/11/16  [provider]  SPIRIVA HANDIHALER 18 MCG inhalation capsule Take 18 mcg by mouth at bedtime.  06/23/15   [provider]       PHYSICAL EXAMINATION:   VITAL SIGNS: Blood pressure 98/73, pulse (!) 124, temperature (!) 89.5 F (31.9 C), resp. rate 18, height 5' 9"  (1.753 m), weight 107 kg (236 lb), SpO2 97 %.  GENERAL:  61 y.o.-year-old patient lying in the bed with  acute distress.  EYES: Pupils equal, round, reactive to light and accommodation. No scleral icterus. Extraocular muscles intact.  HEENT: Head atraumatic, normocephalic. Oropharynx and nasopharynx clear.  NECK:  Supple, no jugular venous distention. No thyroid enlargement, no tenderness.  LUNGS: Normal breath sounds bilaterally, no wheezing, rales,rhonchi or crepitation. No use of accessory muscles of respiration. ETT and vent support. CARDIOVASCULAR: S1, S2 normal. No murmurs, rubs, or gallops.  ABDOMEN: Soft, nontender, nondistended. Bowel sounds present. No organomegaly or mass.  EXTREMITIES: No pedal edema, cyanosis, or clubbing.  NEUROLOGIC: On propofol for sedation with vent, so not able to check. PSYCHIATRIC: The patient is sedated.  SKIN: No obvious rash, lesion, or ulcer.   LABORATORY PANEL:   CBC Recent Labs  Lab 12/18/16 0849  WBC 12.2*  HGB 13.8  HCT 43.1  PLT 161  MCV 85.2  MCH 27.2  MCHC 31.9*  RDW 14.7*  LYMPHSABS 4.0*  MONOABS 0.6  EOSABS 0.3  BASOSABS 0.1   ------------------------------------------------------------------------------------------------------------------  Chemistries  No results for input(s): NA, K, CL, CO2, GLUCOSE, BUN, CREATININE, CALCIUM, MG, AST, ALT, ALKPHOS, BILITOT in the last 168 hours.  Invalid input(s): GFRCGP ------------------------------------------------------------------------------------------------------------------ CrCl cannot be calculated (Patient's most recent lab result is older than the maximum 21 days allowed.). ------------------------------------------------------------------------------------------------------------------ No results for input(s): TSH, T4TOTAL, T3FREE,  THYROIDAB in the last 72 hours.  Invalid input(s): FREET3   Coagulation profile Recent Labs  Lab 12/18/16 0849  INR 0.98   ------------------------------------------------------------------------------------------------------------------- No results for input(s): DDIMER in the last 72 hours. -------------------------------------------------------------------------------------------------------------------  Cardiac Enzymes No results for input(s): CKMB, TROPONINI, MYOGLOBIN in the last 168 hours.  Invalid input(s): CK ------------------------------------------------------------------------------------------------------------------ Invalid input(s): POCBNP  ---------------------------------------------------------------------------------------------------------------  Urinalysis    Component Value Date/Time   COLORURINE STRAW (A) 12/04/2015 0306   APPEARANCEUR CLEAR (A) 12/04/2015 0306   APPEARANCEUR Clear 08/03/2013 2138   LABSPEC 1.016 12/04/2015 0306   LABSPEC 1.028 08/03/2013 2138   PHURINE 6.0 12/04/2015 0306   GLUCOSEU >500 (A) 12/04/2015 0306   GLUCOSEU Negative 08/03/2013 2138   HGBUR 1+ (A) 12/04/2015 0306   BILIRUBINUR NEGATIVE 12/04/2015 0306   BILIRUBINUR Negative 08/03/2013 2138   KETONESUR NEGATIVE 12/04/2015 0306   PROTEINUR 100 (A) 12/04/2015 0306   UROBILINOGEN 0.2 09/27/2006 2132   NITRITE NEGATIVE 12/04/2015 0306   LEUKOCYTESUR NEGATIVE 12/04/2015 0306   LEUKOCYTESUR Negative 08/03/2013 2138     RADIOLOGY: Dg Chest Portable 1 View  Result Date: 12/18/2016 CLINICAL DATA:  Respiratory distress and status post intubation. EXAM: PORTABLE CHEST 1 VIEW COMPARISON:  12/05/2015 FINDINGS: Endotracheal tube present with the tip approximately 4 cm above the carina.  There is new consolidative airspace disease in the right lower lung consistent with pneumonia/ aspiration. Airspace disease is also suspected in the left perihilar lung. No pneumothorax or  significant pleural effusions identified. The heart size is stable and within normal limits. Pacing pads present. IMPRESSION: 1. Endotracheal tube tip approximately 4 cm above the carina. 2. New airspace disease in the right lower lung consistent with pneumonia/aspiration. Airspace disease is also suspected in the left perihilar lung. Electronically Signed   By: Aletta Edouard M.D.   On: 12/18/2016 09:08   Dg Abd Portable 1 View  Result Date: 12/18/2016 CLINICAL DATA:  OG tube placement EXAM: PORTABLE ABDOMEN - 1 VIEW COMPARISON:  12/05/2015 FINDINGS: Enteric tube tip is in the proximal to mid stomach with the side port in the proximal stomach. IMPRESSION: Enteric tube tip in the stomach. Electronically Signed   By: Rolm Baptise M.D.   On: 12/18/2016 09:33    EKG: Orders placed or performed during the hospital encounter of 12/18/16  . ED EKG  . ED EKG  . EKG 12-Lead  . EKG 12-Lead    IMPRESSION AND PLAN:  * Ac respi failure   Ac Diastolic CHF exacerbation   Pneumonia and sepsis    ETT and vent support, monitor in ICU   Vanc + augmentin- as there may be aspiration due to Intubation in field   Bl cx sent, check MRSA screen.   IV fluid support, due to Hypotension   May need lasix later once BP and sepsis under control.  * DM with DKA   Bl sugar is high with CO2 of 17 and K is < 3.   DKA protocol per ICU, will start IV fluid with KCL.  * COPD    Does not have active wheeze.    May give combivent.  * CKD stage 3   Monitor.  * elevated troponin    This is due to Sepsis, CHF and CKD, monitor.  All the records are reviewed and case discussed with ED provider. Management plans discussed with the patient, family and they are in agreement.  CODE STATUS: Full Code Status History    Date Active Date Inactive Code Status Order ID Comments User Context   12/04/2015 05:09 12/07/2015 17:56 Full Code [id number]  Holley Raring, NP ED   10/28/2015 16:40 10/29/2015 17:11 Full Code  [id number]  Bettey Costa, MD Inpatient   08/29/2015 14:38 08/30/2015 17:50 Full Code [id number]  Hugelmeyer, Ubaldo Glassing, DO Inpatient      Discussed with her husband and a friend about the condition and plan. TOTAL TIME TAKING CARE OF THIS PATIENT: 50 critical care minutes.    Vaughan Basta M.D on 12/18/2016   Between 7am to 6pm - Pager - [phone]  After 6pm go to [email or web address] - password [email or web address] [facility]  Office  [phone]  CC: Primary care physician; [facility]   Note: This dictation was prepared with Dragon dictation along with smaller phrase technology. Any transcriptional errors that result from this process are unintentional.

## 2016-12-18 NOTE — ED Notes (Signed)
Called ICU, gave report to Quita Skye

## 2016-12-18 NOTE — Consult Note (Addendum)
[facility] Consultation      Name: [name] MRN: [id number] DOB: 1955/08/30    ADMISSION DATE:  12/18/2016 CONSULTATION DATE:  12/18/16  REFERRING MD : Dr. Anselm Jungling   CHIEF COMPLAINT:   Shortness of breath  HISTORY OF PRESENT ILLNESS   61 years old lady with a past medical history significant for CHF, COPD, diabetes, tobacco abuse who presented to the hospital with respiratory failure. History is obtained from chart review as the patient is intubated and mechanically ventilated and no family is available for interview.  Briefly the patient is 61 years old with past medical history given above who had not been feeling well for the past few days.  She had been having shortness of breath that progressively worsened with time.  She also had orthopnea.  This morning the patient got significantly short of breath and EMS was called.  The patient was intubated in the field and was brought to the hospital. On arrival to the hospital the patient was found to be hypertensive with systolic blood pressure in the 200 s.  The patient was started on Nitropaste and her blood pressure dropped down to the 90s after initiation of sedation.  Lab review showed leukocytosis, hypokalemia, metabolic acidosis, acute kidney injury as well as lactic acidosis with a lactate level of 5.  Review of chart shows echocardiogram that was performed in 2017 that showed ejection fraction of 55-65%.  EKG showed evidence of left ventricular hypertrophy.  I was concerned about possible ST elevation MI and discussed the case with Dr. Fletcher Anon at 12:30 pm who reviewed the EKG and did not think this represented an ST elevation MI. He advised consulting general cardiology and notified Dr. Marylou Mccoy. Since the patient was unassigned, on Dr. Bernie Covey recommendation, Dr. Ubaldo Glassing was consulted shortly thereafter.   PAST MEDICAL HISTORY    :  Past Medical History:  Diagnosis Date  . CHF (congestive heart  failure) ([place])   . COPD (chronic obstructive pulmonary disease) ([place])   . Diabetes mellitus without complication [facility])    Past Surgical History:  Procedure Laterality Date  . APPENDECTOMY    . CHOLECYSTECTOMY     Prior to Admission medications   Medication Sig Start Date End Date Taking? Authorizing Provider  aspirin 81 MG chewable tablet Chew 81 mg by mouth every morning.    [provider]  carvedilol (COREG) 12.5 MG tablet Take 12.5 mg by mouth 2 (two) times daily. 08/05/15   [provider]  fluticasone (FLONASE) 50 MCG/ACT nasal spray Place 2 sprays into the nose daily as needed for rhinitis. 06/03/15   [provider]  furosemide (LASIX) 40 MG tablet Take 40 mg by mouth daily.  08/17/15 08/16/16  [provider]  hydrochlorothiazide (MICROZIDE) 12.5 MG capsule Take 12.5 mg by mouth every evening. 08/05/15   [provider]  insulin aspart (NOVOLOG) 100 UNIT/ML FlexPen Inject 14 Units into the skin See admin instructions. Inject 12 units subcutaneously 3 times daily with meals. And as needed. 10/29/15 10/28/16  Bettey Costa, MD  insulin regular human CONCENTRATED (HUMULIN R) 500 UNIT/ML injection Inject 0.04 mLs (20 Units total) into the skin 2 (two) times daily with a meal. Pt on sliding scale Patient taking differently: Inject 12 Units into the skin 2 (two) times daily. 12 units morning 12 units night 08/30/15   Fritzi Mandes, MD  loratadine (CLARITIN) 10 MG tablet Take 10 mg by mouth at bedtime as needed for allergies. 05/19/15 05/18/16  [provider]  metFORMIN (GLUCOPHAGE) 1000 MG tablet Take 1,000 mg by mouth 2 (two) times daily. 07/30/14   [provider]  oxyCODONE-acetaminophen (PERCOCET/ROXICET) 5-325 MG tablet Take 1 tablet by mouth 3 (three) times daily as needed. 12/07/15   Demetrios Loll, MD  potassium chloride SA (K-DUR,KLOR-CON) 20 MEQ tablet Take 20 mEq by mouth daily.    [provider]  PROAIR HFA 108 [phone] Base)  MCG/ACT inhaler Inhale 2 puffs into the lungs See admin instructions. Inhale 2 puffs by mouth every 4 to 6 hours as needed for shortness of breath/cough. 06/22/15   [provider]  ramipril (ALTACE) 10 MG capsule Take 10 mg by mouth at bedtime. 03/17/15 01/11/16  [provider]  SPIRIVA HANDIHALER 18 MCG inhalation capsule Take 18 mcg by mouth at bedtime.  06/23/15   [provider]   Allergies  Allergen Reactions  . Latex Anaphylaxis and Rash  . Gabapentin Nausea And Vomiting     FAMILY HISTORY   Family History  Problem Relation Age of Onset  . Hypertension Mother       SOCIAL HISTORY    reports that she has been smoking cigarettes.  She has been smoking about 0.25 packs per day. she has never used smokeless tobacco. She reports that she does not drink alcohol or use drugs.  ROS As given in HPI otherwise negative   As given in HPI otherwise negative  VITAL SIGNS    Temp:  [89.5 F (31.9 C)-99 F (37.2 C)] 97.1 F (36.2 C) (12/02 1649) Pulse Rate:  [100-159] 100 (12/02 1040) Resp:  [0-84] 28 (12/02 1040) BP: (82-241)/(57-139) 130/81 (12/02 1040) SpO2:  [71 %-100 %] 99 % (12/02 1135) FiO2 (%):  [40 %-100 %] 40 % (12/02 1509) Weight:  [209 lb 14.1 oz (95.2 kg)-236 lb 1.8 oz (107.1 kg)] 209 lb 14.1 oz (95.2 kg) (12/02 1141) HEMODYNAMICS:   VENTILATOR SETTINGS: Vent Mode: PRVC FiO2 (%):  [40 %-100 %] 40 % Set Rate:  [18 bmp] 18 bmp Vt Set:  [500 mL] 500 mL PEEP:  [8 cmH20-12 cmH20] 8 cmH20 Plateau Pressure:  [23 cmH20-26 cmH20] 23 cmH20 INTAKE / OUTPUT:  Intake/Output Summary (Last 24 hours) at 12/18/2016 1845 Last data filed at 12/18/2016 1830 Gross per 24 hour  Intake 1089.02 ml  Output 775 ml  Net 314.02 ml       PHYSICAL EXAM   Orally intubated and mechanically ventilated. Sedated. Right pupil fixed and dilated.   There anicteric. Oral mucosa moist. CVS S1, S2 clear to auscultation bilaterally with no rales rhonchi or  wheezes. Abdomen is obese, soft, nontender. No gross extremity deformity    LABS   LABS:  CBC Recent Labs  Lab 12/18/16 0849 12/18/16 1044 12/18/16 1402  WBC 12.2* 18.1* 15.6*  HGB 13.8 13.8 12.4  HCT 43.1 42.6 38.0  PLT 161 114* 131*   Coag's Recent Labs  Lab 12/18/16 0849 12/18/16 1452  APTT  --  29  INR 0.98 1.07   BMET Recent Labs  Lab 12/18/16 0849 12/18/16 1044  NA 134* 135  K 2.6* 4.0  CL 102 104  CO2 17* 18*  BUN 15 15  CREATININE 1.27* 1.28*  GLUCOSE 518* 444*   Electrolytes Recent Labs  Lab 12/18/16 0849 12/18/16 1044  CALCIUM 8.3* 8.1*   Sepsis Markers Recent Labs  Lab 12/18/16 0850 12/18/16 1044  LATICACIDVEN 5.0* 2.8*   ABG Recent Labs  Lab 12/18/16 0850 12/18/16 1230  PHART 7.30* 7.37  PCO2ART 34 35  PO2ART 63* 214*   Liver Enzymes Recent Labs  Lab 12/18/16 0849  AST 24  ALT 10*  ALKPHOS 106  BILITOT 0.4  ALBUMIN 2.9*   Cardiac Enzymes Recent Labs  Lab 12/18/16 0849 12/18/16 1044 12/18/16 1409  TROPONINI 0.04* 0.85* 8.86*   Glucose Recent Labs  Lab 12/18/16 1020 12/18/16 1225 12/18/16 1401 12/18/16 1515 12/18/16 1614 12/18/16 1739  GLUCAP 491* 423* 375* 280* 309* 256*     Recent Results (from the past 240 hour(s))  MRSA PCR Screening     Status: Abnormal   Collection Time: 12/18/16  4:38 PM  Result Value Ref Range Status   MRSA by PCR POSITIVE (A) NEGATIVE Final    Comment:        The GeneXpert MRSA Assay (FDA approved for NASAL specimens only), is one component of a comprehensive MRSA colonization surveillance program. It is not intended to diagnose MRSA infection nor to guide or monitor treatment for MRSA infections. RESULT CALLED TO, READ BACK BY AND VERIFIED WITH: BARBARA ALLEN 12/18/16 AT 1800 BY HS      Current Facility-Administered Medications:  .  0.9 % NaCl with KCl 40 mEq / L  infusion, , Intravenous, Continuous, Vaughan Basta, MD, Last Rate: 125 mL/hr at 12/18/16  1205, 125 mL/hr at 12/18/16 1205 .  [START ON 12/19/2016] aspirin tablet 325 mg, 325 mg, Per Tube, Daily, Nettie Elm, MD .  atorvastatin (LIPITOR) tablet 80 mg, 80 mg, Per Tube, q1800, Nettie Elm, MD, 80 mg at 12/18/16 1627 .  ceFEPIme (MAXIPIME) 2 g in dextrose 5 % 50 mL IVPB, 2 g, Intravenous, Q8H, Vaughan Basta, MD, Stopped at 12/18/16 1552 .  chlorhexidine gluconate (MEDLINE KIT) (PERIDEX) 0.12 % solution 15 mL, 15 mL, Mouth Rinse, BID, Nettie Elm, MD .  Derrill Memo ON 12/19/2016] Chlorhexidine Gluconate Cloth 2 % PADS 6 each, 6 each, Topical, Q0600, Nettie Elm, MD .  docusate sodium (COLACE) capsule 100 mg, 100 mg, Oral, BID PRN, Vaughan Basta, MD .  fentaNYL (SUBLIMAZE) injection 50 mcg, 50 mcg, Intravenous, Q2H PRN, Nettie Elm, MD, 50 mcg at 12/18/16 1159 .  heparin ADULT infusion 100 units/mL (25000 units/227m sodium chloride 0.45%), 1,100 Units/hr, Intravenous, Continuous, VVaughan Basta MD, Last Rate: 11 mL/hr at 12/18/16 1508, 1,100 Units/hr at 12/18/16 1508 .  insulin regular (NOVOLIN R,HUMULIN R) 100 Units in sodium chloride 0.9 % 100 mL (1 Units/mL) infusion, , Intravenous, Continuous, VVaughan Basta MD, Last Rate: 7.8 mL/hr at 12/18/16 1802 .  ipratropium-albuterol (DUONEB) 0.5-2.5 (3) MG/3ML nebulizer solution 3 mL, 3 mL, Nebulization, Q6H, KNettie Elm MD, 3 mL at 12/18/16 1403 .  [START ON 12/19/2016] MEDLINE mouth rinse, 15 mL, Mouth Rinse, QID, KNettie Elm MD .  metoprolol tartrate (LOPRESSOR) tablet 12.5 mg, 12.5 mg, Per Tube, BID, KNettie Elm MD .  mupirocin ointment (BACTROBAN) 2 % 1 application, 1 application, Nasal, BID, KNettie Elm MD .  pantoprazole (PROTONIX) injection 40 mg, 40 mg, Intravenous, Q24H, KNettie Elm MD, 40 mg at 12/18/16 1623 .  propofol (DIPRIVAN) 1000 MG/100ML infusion, 5-80 mcg/kg/min, Intravenous, Titrated, KNettie Elm MD, Last Rate: 20 mL/hr at 12/18/16 1624, 35 mcg/kg/min at 12/18/16 1624 .  [COMPLETED]  sodium chloride 0.9 % bolus 1,000 mL, 1,000 mL, Intravenous, Once, Stopped at 12/18/16 1107 **AND** sodium chloride 0.9 % bolus 1,000 mL, 1,000 mL, Intravenous, Once **AND** [COMPLETED] sodium chloride 0.9 % bolus 500 mL, 500 mL, Intravenous, Once, MSchuyler Amor MD, Stopped at 12/18/16 1044 .  vancomycin (VANCOCIN) IVPB  1000 mg/200 mL premix, 1,000 mg, Intravenous, Q12H, Ramond Dial, RPH, Stopped at 12/18/16 1724  IMAGING    Ct Head Wo Contrast  Result Date: 12/18/2016 CLINICAL DATA:  Respiratory failure and status post intubation. EXAM: CT HEAD WITHOUT CONTRAST TECHNIQUE: Contiguous axial images were obtained from the base of the skull through the vertex without intravenous contrast. COMPARISON:  11/07/2012 FINDINGS: Brain: No acute hemorrhage identified. There is an old appearing lacunar infarct along the posterior aspect of the left caudate nucleus where it borders the internal capsule. No acute appearing cerebral infarct identified by CT. No evidence of mass effect, mass lesion, hydrocephalus or extra-axial fluid collection. Vascular: No hyperdense vessel or unexpected calcification. Skull: Normal. Negative for fracture or focal lesion. Sinuses/Orbits: No acute finding. Other: None. IMPRESSION: No acute findings. 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 Electronically Signed   By: Aletta Edouard M.D.   On: 12/18/2016 13:19   Dg Chest Portable 1 View  Result Date: 12/18/2016 CLINICAL DATA:  Respiratory distress and status post intubation. EXAM: PORTABLE CHEST 1 VIEW COMPARISON:  12/05/2015 FINDINGS: Endotracheal tube present with the tip approximately 4 cm above the carina. There is new consolidative airspace disease in the right lower lung consistent with pneumonia/ aspiration. Airspace disease is also suspected in the left perihilar lung. No pneumothorax or significant pleural effusions identified. The heart size is stable and within normal  limits. Pacing pads present. IMPRESSION: 1. Endotracheal tube tip approximately 4 cm above the carina. 2. New airspace disease in the right lower lung consistent with pneumonia/aspiration. Airspace disease is also suspected in the left perihilar lung. Electronically Signed   By: Aletta Edouard M.D.   On: 12/18/2016 09:08   Dg Abd Portable 1 View  Result Date: 12/18/2016 CLINICAL DATA:  OG tube placement EXAM: PORTABLE ABDOMEN - 1 VIEW COMPARISON:  12/05/2015 FINDINGS: Enteric tube tip is in the proximal to mid stomach with the side port in the proximal stomach. IMPRESSION: Enteric tube tip in the stomach. Electronically Signed   By: Rolm Baptise M.D.   On: 12/18/2016 09:33       MICRO DATA: MRSA PCR  Urine  Blood Resp     ASSESSMENT/PLAN    61 years old lady with a past medical history significant for CHF, COPD, diabetes, tobacco abuse who presented to the hospital with respiratory failure.  Problem list  Acute hypoxemic respiratory failure Hypertensive emergency Metabolic acidosis Lactic acidosis Hypokalemia Elevated troponins CKD CHF COPD Diabetes mellitus Tobacco abuse-half a pack of cigarettes a day Noncompliance with medications   Patient admitted to the ICU.  Continue mechanical ventilation.  ABG reviewed.  Shows metabolic acidosis. Continue vent support. Daily sedation interruption and spontaneous breathing trial when clinically indicated. Daily chest x-rays and ABGs while she is on the ventilator. Lung protective ventilation. DuoNeb every 6 hours  Patient hypertensive emergency resolved after she was placed on Nitropaste.  This has since been removed. Her blood pressures have remained stable. Continue beta-blockers.  Given the patient's right pupil dilatation, a stat CT head of was performed that was negative for acute intracranial events. Monitor clinically.  Troponin elevation noted.?  Non-ST elevation MI versus demand ischemia.   After head bleed  was ruled out, the patient was started on IV heparin and high-dose aspirin. She was also started on high-dose statins. Discussed case with Dr. Ubaldo Glassing has seen the patient in consultation. We will get an echocardiogram.  Continue antibiotics.  Agree with vancomycin and  cefepime. Follow blood cultures. Check sputum cultures. Check pro-calcitonin.  Anion gap acidosis is likely secondary to lactic acidosis.  Acidosis is improving. I do not think the patient is in DKA and will start the DKA protocol.  However we will continue her on insulin drip for elevated blood sugars.  Potassium has been replaced  Monitor urine output.  Monitor daily creatinine.  Heparin drip as above Protonix for stress ulcer prophylaxis Insulin drip as above.   I have personally obtained a history, examined the patient, evaluated laboratory and independently reviewed  imaging results, formulated the assessment and plan and placed orders.  The Patient requires high complexity decision making for assessment and support, frequent evaluation and titration of therapies, application of advanced monitoring technologies and extensive interpretation of multiple databases. Critical Care Time devoted to patient care services described in this note is 60 minutes.    Nettie Elm, M.D.  Pulmonary and Critical Care Medicine.

## 2016-12-18 NOTE — Progress Notes (Signed)
Decreased FIO2 to 90%.

## 2016-12-18 NOTE — ED Triage Notes (Signed)
EMS - respiratory distress

## 2016-12-18 NOTE — ED Provider Notes (Addendum)
[facility] Emergency Department Provider Note  ____________________________________________   I have reviewed the triage vital signs and the nursing notes.   HISTORY  Chief Complaint Respiratory Distress    HPI [name] is a 61 y.o. female presents today in respiratory distress.  Level 5 chart caveat; no further history available due to patient status.  She does have a history of CHF pneumonia COPD she is supposed to be on oxygen according to EMS she was not on oxygen when I found her she does smoke, she was in a hotel she was satting in the 60s I put her on BiPAP and her sats came up.  Blood pressure was very elevated initially for the fire department but they got one reading of low blood pressure, EMS became appropriately concerned about this and turned off the BiPAP.  Patient arrives in respiratory distress, she is altered and she is not moving air.  Gray color.     Past Medical History:  Diagnosis Date  . CHF (congestive heart failure) ([place])   . Diabetes mellitus without complication [facility])     Patient Active Problem List   Diagnosis Date Noted  . CHF exacerbation ([place]) 12/04/2015  . PNA (pneumonia) 10/28/2015  . COPD exacerbation ([place]) 08/29/2015    Past Surgical History:  Procedure Laterality Date  . APPENDECTOMY    . CHOLECYSTECTOMY      Prior to Admission medications   Medication Sig Start Date End Date Taking? Authorizing Provider  aspirin 81 MG chewable tablet Chew 81 mg by mouth every morning.    [provider]  carvedilol (COREG) 12.5 MG tablet Take 12.5 mg by mouth 2 (two) times daily. 08/05/15   [provider]  fluticasone (FLONASE) 50 MCG/ACT nasal spray Place 2 sprays into the nose daily as needed for rhinitis. 06/03/15   [provider]  furosemide (LASIX) 40 MG tablet Take 40 mg by mouth daily.  08/17/15 08/16/16  [provider]  hydrochlorothiazide (MICROZIDE) 12.5 MG capsule Take 12.5  mg by mouth every evening. 08/05/15   [provider]  insulin aspart (NOVOLOG) 100 UNIT/ML FlexPen Inject 14 Units into the skin See admin instructions. Inject 12 units subcutaneously 3 times daily with meals. And as needed. 10/29/15 10/28/16  Bettey Costa, MD  insulin regular human CONCENTRATED (HUMULIN R) 500 UNIT/ML injection Inject 0.04 mLs (20 Units total) into the skin 2 (two) times daily with a meal. Pt on sliding scale Patient taking differently: Inject 12 Units into the skin 2 (two) times daily. 12 units morning 12 units night 08/30/15   Fritzi Mandes, MD  loratadine (CLARITIN) 10 MG tablet Take 10 mg by mouth at bedtime as needed for allergies. 05/19/15 05/18/16  [provider]  metFORMIN (GLUCOPHAGE) 1000 MG tablet Take 1,000 mg by mouth 2 (two) times daily. 07/30/14   [provider]  oxyCODONE-acetaminophen (PERCOCET/ROXICET) 5-325 MG tablet Take 1 tablet by mouth 3 (three) times daily as needed. 12/07/15   Demetrios Loll, MD  potassium chloride SA (K-DUR,KLOR-CON) 20 MEQ tablet Take 20 mEq by mouth daily.    [provider]  PROAIR HFA 108 [phone] Base) MCG/ACT inhaler Inhale 2 puffs into the lungs See admin instructions. Inhale 2 puffs by mouth every 4 to 6 hours as needed for shortness of breath/cough. 06/22/15   [provider]  ramipril (ALTACE) 10 MG capsule Take 10 mg by mouth at bedtime. 03/17/15 01/11/16  [provider]  SPIRIVA HANDIHALER 18 MCG inhalation capsule  Take 18 mcg by mouth at bedtime.  06/23/15   [provider]    Allergies Latex and Gabapentin  No family history on file.  Social History Social History   Tobacco Use  . Smoking status: Current Every Day Smoker    Packs/day: 0.25    Types: Cigarettes  . Smokeless tobacco: Never Used  Substance Use Topics  . Alcohol use: No  . Drug use: No    Review of Systems See HPI level 5 chart caveat; no further history available due to patient  status.   ____________________________________________   PHYSICAL EXAM:  VITAL SIGNS: ED Triage Vitals  Enc Vitals Group     BP      Pulse      Resp      Temp      Temp src      SpO2      Weight      Height      Head Circumference      Peak Flow      Pain Score      Pain Loc      Pain Edu?      Excl. in [place]?     Constitutional: Somnolent, toxic Eyes: Conjunctivae are normal Head: Atraumatic HEENT: No congestion/rhinnorhea. Mucous membranes are moist.  Oropharynx non-erythematous Neck:   Nontender with no meningismus, no masses, no stridor Cardiovascular: Normal rate, regular rhythm. Grossly normal heart sounds.  Good peripheral circulation. Respiratory: Very poor respiratory effort diffuse rales diminished in the bases Abdominal: Soft and nontender. No distention. No guarding no rebound Back:  There is no focal tenderness or step off.  there is no midline tenderness there are no lesions noted. there is no CVA tenderness Musculoskeletal: No lower extremity tenderness, no upper extremity tenderness. No joint effusions, no DVT signs strong distal pulses mild bilateral symmetric edema Neurologic:  Normal speech and language. No gross focal neurologic deficits are appreciated.  Skin:  Skin is warm, dry and intact. No rash noted.   ____________________________________________   LABS (all labs ordered are listed, but only abnormal results are displayed)  Labs Reviewed  CULTURE, BLOOD (ROUTINE X 2)  CULTURE, BLOOD (ROUTINE X 2)  BRAIN NATRIURETIC PEPTIDE  CBC WITH DIFFERENTIAL/PLATELET  TROPONIN I  COMPREHENSIVE METABOLIC PANEL  PROTIME-INR  URINALYSIS, COMPLETE (UACMP) WITH MICROSCOPIC  LACTIC ACID, PLASMA  LACTIC ACID, PLASMA  BLOOD GAS, ARTERIAL    Pertinent labs  results that were available during my care of the patient were reviewed by me and considered in my medical decision making (see chart for  details). ____________________________________________  EKG  I personally interpreted any EKGs ordered by me or triage Sinus tach rate 158, normal axis no acute ischemic changes, long QT ____________________________________________  RADIOLOGY  Pertinent labs & imaging results that were available during my care of the patient were reviewed by me and considered in my medical decision making (see chart for details). If possible, patient and/or family made aware of any abnormal findings.  No results found. ____________________________________________    PROCEDURES  Procedure(s) performed: None  Procedure Name: Intubation Date/Time: 12/18/2016 9:10 AM Performed by: Schuyler Amor, MD Pre-anesthesia Checklist: Patient identified, Emergency Drugs available, Suction available, Patient being monitored and Timeout performed Oxygen Delivery Method: Ambu bag Preoxygenation: Pre-oxygenation with 100% oxygen Induction Type: IV induction Ventilation: Mask ventilation without difficulty Laryngoscope Size: Glidescope and 3 Grade View: Grade II Tube size: 7.5 mm Number of attempts: 1 Placement Confirmation: ETT inserted through vocal cords under direct  vision,  Positive ETCO2,  CO2 detector and Breath sounds checked- equal and bilateral Secured at: 25 cm Tube secured with: Tape Dental Injury: Teeth and Oropharynx as per pre-operative assessment        Critical Care performed: CRITICAL CARE Performed by: Schuyler Amor   Total critical care time: 60 minutes  Critical care time was exclusive of separately billable procedures and treating other patients.  Critical care was necessary to treat or prevent imminent or life-threatening deterioration.  Critical care was time spent personally by me on the following activities: development of treatment plan with patient and/or surrogate as well as nursing, discussions with consultants, evaluation of patient's response to treatment,  examination of patient, obtaining history from patient or surrogate, ordering and performing treatments and interventions, ordering and review of laboratory studies, ordering and review of radiographic studies, pulse oximetry and re-evaluation of patient's condition.   ____________________________________________   INITIAL IMPRESSION / ASSESSMENT AND PLAN / ED COURSE  Pertinent labs & imaging results that were available during my care of the patient were reviewed by me and considered in my medical decision making (see chart for details).  Patient here in acute respiratory distress most likely CHF, I put her on immediately on dense dermal nitroglycerin for a blood pressure of 238/137, intubated her immediately with no complications, giving her Lasix, nitro drip has been ordered but her pressure is now down in the 150s, sats are in the 90s on 100%, wheezing high PEEP.  Most likely this is flash pulmonary edema consistent with prior.  ABG and blood work is pending.  Several attempts to call next of kin resulted in a busy signal. ----------------------------------------- 9:09 AM on 12/18/2016 -----------------------------------------  ETT-tube advanced after cxr. ----------------------------------------- 9:32 AM on 12/18/2016 -----------------------------------------  Family here, states she has been coughing for several days.  Otherwise no further history.  Admitted to the hospitalist service.  Blood pressure did go down after intubation we have taken off the Nitropaste and got down on the propofol drip.  Will give fluids as needed.  I do believe however there is an element of CHF in here given initial presentation so we will try to avoid fluids pending BNP and further assessment.  ----------------------------------------- 9:47 AM on 12/18/2016 -----------------------------------------  BNP is elevated but lactic is also elevated likely sepsis and CHF combined given that she is intubated, we  will give her IV fluids in response to possible sepsis from possible pneumonia.  Family did state that she had been coughing.  PH is low bicarb is low.  We will initiate sepsis protocol.     ____________________________________________   FINAL CLINICAL IMPRESSION(S) / ED DIAGNOSES  Final diagnoses:  None      This chart was dictated using voice recognition software.  Despite best efforts to proofread,  errors can occur which can change meaning.      Schuyler Amor, MD 12/18/16 [id number]    Schuyler Amor, MD 12/18/16 1638    Schuyler Amor, MD 12/18/16 4665    Schuyler Amor, MD 12/18/16 9935    Schuyler Amor, MD 12/18/16 [phone]

## 2016-12-18 NOTE — Consult Note (Signed)
[facility]  CARDIOLOGY CONSULT NOTE  Patient ID: [name] MRN: [id number] DOB/AGE: [birth year] 62 y.o.  Admit date: 12/18/2016 Referring Physician Dr. Lanier Ensign Primary Physician Dr. Francee Nodal Primary Cardiologist   Reason for Consultation abnormal troponin  HPI: Pt is a 61 yo female with history of a reported hypertensive cardiomyopathy with echo done in  10/2105 revealing ef of 55-65%, concentric hypertrophy, with mild mr and tr, history of copd, medication noncompliance and continued tobacco abuse who presented to the er with increasing sob which has worsened over the past few days.  She was apparently in a hotel room when ems was sumoned and was noted to have oxygen sat in the 60's. She was placed on bipap. She initially had extreme hypertension but became hypotensive. Pt was intubated and  CXR in the er revealed new airspace disease in the right lower lung c/w pneumonia/aspiration and possible airspace disease in the left perihilar lung. EKG revealed sinus tachycardia with lvh. BNP was 472, cbc 12.2/13.8/43.1. Initial troponin was 0.04 increased to 0.85 thus far. Lactate was 5.0. Her labs revealed serum potassium of 2.6 with a serum glucose of 518, creatinine of 1.27. This improved to K of 4.0. She apparently had a cardiac cath in [place] in 2015. Records not available. Pt is intubated and not able to give history.   Review of Systems  Unable to perform ROS: Intubated    Past Medical History:  Diagnosis Date  . CHF (congestive heart failure) ([place])   . COPD (chronic obstructive pulmonary disease) ([place])   . Diabetes mellitus without complication ([place])     Family History  Problem Relation Age of Onset  . Hypertension Mother     Social History   Socioeconomic History  . Marital status: Married    Spouse name: Not on file  . Number of children: Not on file  . Years of education: Not on file  . Highest education level:  Not on file  Social Needs  . Financial resource strain: Not on file  . Food insecurity - worry: Not on file  . Food insecurity - inability: Not on file  . Transportation needs - medical: Not on file  . Transportation needs - non-medical: Not on file  Occupational History  . Not on file  Tobacco Use  . Smoking status: Current Every Day Smoker    Packs/day: 0.25    Types: Cigarettes  . Smokeless tobacco: Never Used  Substance and Sexual Activity  . Alcohol use: No  . Drug use: No  . Sexual activity: Not on file  Other Topics Concern  . Not on file  Social History Narrative  . Not on file    Past Surgical History:  Procedure Laterality Date  . APPENDECTOMY    . CHOLECYSTECTOMY       Medications Prior to Admission  Medication Sig Dispense Refill Last Dose  . aspirin 81 MG chewable tablet Chew 81 mg by mouth every morning.   10/27/2015 at 0600  . carvedilol (COREG) 12.5 MG tablet Take 12.5 mg by mouth 2 (two) times daily.   10/27/2015 at 2000  . fluticasone (FLONASE) 50 MCG/ACT nasal spray Place 2 sprays into the nose daily as needed for rhinitis.   prn at prn  . furosemide (LASIX) 40 MG tablet Take 40 mg by mouth daily.    Past Week at Unknown time  . hydrochlorothiazide (MICROZIDE) 12.5 MG capsule Take 12.5 mg by mouth every  evening.   10/27/2015 at 2000  . insulin aspart (NOVOLOG) 100 UNIT/ML FlexPen Inject 14 Units into the skin See admin instructions. Inject 12 units subcutaneously 3 times daily with meals. And as needed. 15 mL 11   . insulin regular human CONCENTRATED (HUMULIN R) 500 UNIT/ML injection Inject 0.04 mLs (20 Units total) into the skin 2 (two) times daily with a meal. Pt on sliding scale (Patient taking differently: Inject 12 Units into the skin 2 (two) times daily. 12 units morning 12 units night) 20 mL 0 10/27/2015 at pm  . loratadine (CLARITIN) 10 MG tablet Take 10 mg by mouth at bedtime as needed for allergies.   prn at prn  . metFORMIN (GLUCOPHAGE) 1000 MG  tablet Take 1,000 mg by mouth 2 (two) times daily.   10/27/2015 at 2000  . oxyCODONE-acetaminophen (PERCOCET/ROXICET) 5-325 MG tablet Take 1 tablet by mouth 3 (three) times daily as needed. 12 tablet 0   . potassium chloride SA (K-DUR,KLOR-CON) 20 MEQ tablet Take 20 mEq by mouth daily.   Past Week at Unknown time  . PROAIR HFA 108 (90 Base) MCG/ACT inhaler Inhale 2 puffs into the lungs See admin instructions. Inhale 2 puffs by mouth every 4 to 6 hours as needed for shortness of breath/cough.  5 prn at prn  . ramipril (ALTACE) 10 MG capsule Take 10 mg by mouth at bedtime.   10/27/2015 at Unknown time  . SPIRIVA HANDIHALER 18 MCG inhalation capsule Take 18 mcg by mouth at bedtime.   5 Past Week at pm    Physical Exam: Blood pressure 130/81, pulse 100, temperature 97.6 F (36.4 C), temperature source Axillary, resp. rate (!) 28, height 5' 9"  (1.753 m), weight 95.2 kg (209 lb 14.1 oz), SpO2 99 %.   Wt Readings from Last 1 Encounters:  12/18/16 95.2 kg (209 lb 14.1 oz)     General appearance: Intubated and sedated Resp: rhonchi bilaterally Cardio: regular rate and rhythm GI: normal findings: obese. No obvious masses Extremities: edema 2+ lower extremety edema Pulses: 2+ and symmetric Neurologic: Mental status: intubated and sedated.   Labs:   Lab Results  Component Value Date   WBC 18.1 (H) 12/18/2016   HGB 13.8 12/18/2016   HCT 42.6 12/18/2016   MCV 83.6 12/18/2016   PLT 114 (L) 12/18/2016    Recent Labs  Lab 12/18/16 0849 12/18/16 1044  NA 134* 135  K 2.6* 4.0  CL 102 104  CO2 17* 18*  BUN 15 15  CREATININE 1.27* 1.28*  CALCIUM 8.3* 8.1*  PROT 7.6  --   BILITOT 0.4  --   ALKPHOS 106  --   ALT 10*  --   AST 24  --   GLUCOSE 518* 444*   Lab Results  Component Value Date   CKTOTAL 124 09/28/2006   CKMB 1.3 09/28/2006   TROPONINI 0.85 ([place]) 12/18/2016        ASSESSMENT AND PLAN:  61 yo female with history of hypertensive cardiomyopathy, history of medication non  compliance who is scheduled to be on  Carvedilol 6.25 mg bid, atorvastatin 80 daily, and insulin who presented to the er via ems after being oted to be hypoxic and hypotensive in a hotel room. She was having difficulty breathing and was placed on bipap and subsequently intubated. She had significant electrolyte abnormalities on presentation with serum glucose of 518 with K of 2.6 and creatinine of 1.27. Initial troponin was 0.04 which increased to 0.85. She has a normal lv function  by echo in 1 year ago. EKG showed lvh. She canot give history due to sedaiton and intubation. She is relatively hypotensive since being given propofol. Echo is pending. Does not appear to be having a stemi. Elevated troponin appears to be demand ischemia at present. Head ct was unremarkable for acute intracranial event. CXR showed possible pna with no obvious chf. Will continue with vent wean. Continue with asa. Not candidate for beta blocker at present due to relative hypotension. Will contnue with atorvastatin when taking po. Repeat echo pending. Will continue with heparin. Further recs pending course.   Emperic abx for possible sepsis.  Signed: Teodoro Spray MD, [facility] 12/18/2016, 1:58 PM

## 2016-12-18 NOTE — ED Notes (Signed)
Removed Nitro paste

## 2016-12-18 NOTE — Progress Notes (Addendum)
ANTICOAGULATION CONSULT NOTE - Initial Consult  Pharmacy Consult for heparin Indication: chest pain/ACS  Allergies  Allergen Reactions  . Latex Anaphylaxis and Rash  . Gabapentin Nausea And Vomiting    Patient Measurements: Height: 5' 9"  (175.3 cm) Weight: 209 lb 14.1 oz (95.2 kg) IBW/kg (Calculated) : 66.2 Heparin Dosing Weight: 90 kg  Vital Signs: Temp: 98 F (36.7 C) (12/02 2000) Temp Source: Oral (12/02 2000) BP: 84/51 (12/02 2045) Pulse Rate: 87 (12/02 2045)  Labs: Recent Labs    12/18/16 0849 12/18/16 1044 12/18/16 1402 12/18/16 1409 12/18/16 1452 12/18/16 1809 12/18/16 2125  HGB 13.8 13.8 12.4  --   --   --   --   HCT 43.1 42.6 38.0  --   --   --   --   PLT 161 114* 131*  --   --   --   --   APTT  --   --   --   --  29  --   --   LABPROT 12.9  --   --   --  13.8  --   --   INR 0.98  --   --   --  1.07  --   --   HEPARINUNFRC  --   --   --   --   --   --  0.45  CREATININE 1.27* 1.28*  --   --   --   --   --   TROPONINI 0.04* 0.85*  --  8.86*  --  17.38*  --     Estimated Creatinine Clearance: 56 mL/min (A) (by C-G formula based on SCr of 1.28 mg/dL (H)).   Medical History: Past Medical History:  Diagnosis Date  . CHF (congestive heart failure) ([place])   . COPD (chronic obstructive pulmonary disease) ([place])   . Diabetes mellitus without complication ([facility])     Medications:  Infusions:  . 0.9 % NaCl with KCl 40 mEq / L 125 mL/hr (12/18/16 2018)  . ceFEPime (MAXIPIME) IV Stopped (12/18/16 2147)  . heparin 1,100 Units/hr (12/18/16 2000)  . insulin (NOVOLIN-R) infusion 1.8 Units/hr (12/18/16 2222)  . propofol (DIPRIVAN) infusion 40 mcg/kg/min (12/18/16 2222)  . sodium chloride    . vancomycin Stopped (12/18/16 1724)    Assessment: 89 yof cc respiratory distress, PMH CHF, COPD, DM, tobacco use. Troponin 0.04 - 0.85 -, ECG shows sinus tachycardia, LVH with secondary repolarization abnormality, and prolonged QT interval. Pharmacy consulted to dose UFH for  ACS.   Goal of Therapy:  Heparin level 0.3-0.7 units/ml Monitor platelets by anticoagulation protocol: Yes   Plan:  Give 4000 units bolus x 1 Start heparin infusion at 1100 units/hr Check anti-Xa level in 6 hours and daily while on heparin Continue to monitor H&H and platelets   12/2 2100 heparin level 0.45. Continue current regimen. Recheck heparin level and CBC with tomorrow AM labs.  12/3 AM heparin level 0.45. Continue current regimen. Recheck heparin level and CBC with tomorrow AM labs.  Sim Boast, PharmD, BCPS  12/18/16 10:39 PM

## 2016-12-18 NOTE — Progress Notes (Signed)
Pt's CBG is 209. MD notified to see if her IVF need to be changed to include dextrose. He stated to keep her on the NS with 18mq of K as pt is not in DKA.

## 2016-12-18 NOTE — Progress Notes (Signed)
Pt's MRSA PCR came back positive for MRSA. Positive MRSA screen standing orders placed.

## 2016-12-19 ENCOUNTER — Inpatient Hospital Stay (HOSPITAL_COMMUNITY)
Admit: 2016-12-19 | Discharge: 2016-12-19 | Disposition: A | Discharge status: Home / Self Care | Payer: Medicaid Other | Attending: Internal Medicine | Admitting: Internal Medicine

## 2016-12-19 ENCOUNTER — Encounter
Admission: EM | Disposition: A | Discharge status: Home / Self Care | Payer: Self-pay | Source: Home / Self Care | Attending: Internal Medicine

## 2016-12-19 ENCOUNTER — Encounter: Payer: Self-pay | Admitting: Anesthesiology

## 2016-12-19 ENCOUNTER — Inpatient Hospital Stay: Payer: Medicaid Other

## 2016-12-19 DIAGNOSIS — I340 Nonrheumatic mitral (valve) insufficiency: Secondary | ICD-10-CM

## 2016-12-19 DIAGNOSIS — K920 Hematemesis: Secondary | ICD-10-CM

## 2016-12-19 DIAGNOSIS — I214 Non-ST elevation (NSTEMI) myocardial infarction: Secondary | ICD-10-CM

## 2016-12-19 HISTORY — PX: ESOPHAGOGASTRODUODENOSCOPY: SHX5428

## 2016-12-19 LAB — GLUCOSE, CAPILLARY
GLUCOSE-CAPILLARY: 144 mg/dL — AB (ref 65–99)
GLUCOSE-CAPILLARY: 175 mg/dL — AB (ref 65–99)
GLUCOSE-CAPILLARY: 211 mg/dL — AB (ref 65–99)
GLUCOSE-CAPILLARY: 79 mg/dL (ref 65–99)
GLUCOSE-CAPILLARY: 96 mg/dL (ref 65–99)
Glucose-Capillary: 88 mg/dL (ref 65–99)
Glucose-Capillary: 145 mg/dL — ABNORMAL HIGH (ref 65–99)
Glucose-Capillary: 215 mg/dL — ABNORMAL HIGH (ref 65–99)

## 2016-12-19 LAB — BLOOD GAS, ARTERIAL
ACID-BASE DEFICIT: 6.4 mmol/L — AB (ref 0.0–2.0)
ALLENS TEST (PASS/FAIL): POSITIVE — AB
Acid-base deficit: 6.9 mmol/L — ABNORMAL HIGH (ref 0.0–2.0)
BICARBONATE: 17.9 mmol/L — AB (ref 20.0–28.0)
BICARBONATE: 17.5 mmol/L — AB (ref 20.0–28.0)
FIO2: 0.3
FIO2: 0.24
MECHVT: 500 mL
Mechanical Rate: 18
O2 SAT: 93.8 %
O2 SAT: 92.8 %
PATIENT TEMPERATURE: 37
PCO2 ART: 31 mmHg — AB (ref 32.0–48.0)
PEEP/CPAP: 5 cmH2O
PEEP: 5 cmH2O
PH ART: 7.37 (ref 7.350–7.450)
PO2 ART: 72 mmHg — AB (ref 83.0–108.0)
PO2 ART: 69 mmHg — AB (ref 83.0–108.0)
PRESSURE SUPPORT: 5 cmH2O
Patient temperature: 37
RATE: 18 resp/min
pCO2 arterial: 31 mmHg — ABNORMAL LOW (ref 32.0–48.0)
pH, Arterial: 7.36 (ref 7.350–7.450)

## 2016-12-19 LAB — BASIC METABOLIC PANEL
ANION GAP: 9 (ref 5–15)
Anion gap: 6 (ref 5–15)
BUN: 19 mg/dL (ref 6–20)
BUN: 19 mg/dL (ref 6–20)
CALCIUM: 7.7 mg/dL — AB (ref 8.9–10.3)
CHLORIDE: 110 mmol/L (ref 101–111)
CO2: 20 mmol/L — ABNORMAL LOW (ref 22–32)
CO2: 17 mmol/L — ABNORMAL LOW (ref 22–32)
CREATININE: 1.85 mg/dL — AB (ref 0.44–1.00)
Calcium: 7.9 mg/dL — ABNORMAL LOW (ref 8.9–10.3)
Chloride: 112 mmol/L — ABNORMAL HIGH (ref 101–111)
Creatinine, Ser: 1.9 mg/dL — ABNORMAL HIGH (ref 0.44–1.00)
GFR calc Af Amer: 32 mL/min — ABNORMAL LOW (ref >60)
GFR, EST AFRICAN AMERICAN: 33 mL/min — AB (ref >60)
GFR, EST NON AFRICAN AMERICAN: 28 mL/min — AB (ref >60)
GFR, EST NON AFRICAN AMERICAN: 27 mL/min — AB (ref >60)
Glucose, Bld: 102 mg/dL — ABNORMAL HIGH (ref 65–99)
Glucose, Bld: 179 mg/dL — ABNORMAL HIGH (ref 65–99)
POTASSIUM: 4.1 mmol/L (ref 3.5–5.1)
Potassium: 4.1 mmol/L (ref 3.5–5.1)
SODIUM: 138 mmol/L (ref 135–145)
SODIUM: 136 mmol/L (ref 135–145)

## 2016-12-19 LAB — CBC
HEMATOCRIT: 34.8 % — AB (ref 35.0–47.0)
HEMOGLOBIN: 11.5 g/dL — AB (ref 12.0–16.0)
MCH: 27.6 pg (ref 26.0–34.0)
MCHC: 33.1 g/dL (ref 32.0–36.0)
MCV: 83.3 fL (ref 80.0–100.0)
Platelets: 125 10*3/uL — ABNORMAL LOW (ref 150–440)
RBC: 4.18 MIL/uL (ref 3.80–5.20)
RDW: 15 % — ABNORMAL HIGH (ref 11.5–14.5)
WBC: 13.9 10*3/uL — AB (ref 3.6–11.0)

## 2016-12-19 LAB — BLOOD CULTURE ID PANEL (REFLEXED)
Acinetobacter baumannii: NOT DETECTED
CANDIDA KRUSEI: NOT DETECTED
Candida albicans: NOT DETECTED
Candida glabrata: NOT DETECTED
Candida parapsilosis: NOT DETECTED
Candida tropicalis: NOT DETECTED
ENTEROCOCCUS SPECIES: NOT DETECTED
ESCHERICHIA COLI: NOT DETECTED
Enterobacter cloacae complex: NOT DETECTED
Enterobacteriaceae species: NOT DETECTED
Haemophilus influenzae: NOT DETECTED
KLEBSIELLA OXYTOCA: NOT DETECTED
KLEBSIELLA PNEUMONIAE: NOT DETECTED
LISTERIA MONOCYTOGENES: NOT DETECTED
Neisseria meningitidis: NOT DETECTED
PROTEUS SPECIES: NOT DETECTED
Pseudomonas aeruginosa: NOT DETECTED
SERRATIA MARCESCENS: NOT DETECTED
STAPHYLOCOCCUS AUREUS BCID: NOT DETECTED
STREPTOCOCCUS AGALACTIAE: NOT DETECTED
STREPTOCOCCUS PNEUMONIAE: NOT DETECTED
STREPTOCOCCUS PYOGENES: NOT DETECTED
Staphylococcus species: NOT DETECTED
Streptococcus species: DETECTED — AB

## 2016-12-19 LAB — ECHOCARDIOGRAM COMPLETE
Height: 69 in
WEIGHTICAEL: 3358.05 [oz_av]

## 2016-12-19 LAB — HEMOGLOBIN AND HEMATOCRIT, BLOOD
HCT: 34.4 % — ABNORMAL LOW (ref 35.0–47.0)
HCT: 33.2 % — ABNORMAL LOW (ref 35.0–47.0)
HEMOGLOBIN: 10.9 g/dL — AB (ref 12.0–16.0)
Hemoglobin: 11.3 g/dL — ABNORMAL LOW (ref 12.0–16.0)

## 2016-12-19 LAB — TROPONIN I
TROPONIN I: 18.21 ng/mL — AB (ref <0.03)
Troponin I: 22.11 ng/mL (ref <0.03)
Troponin I: 17.52 ng/mL (ref <0.03)

## 2016-12-19 LAB — HEPARIN LEVEL (UNFRACTIONATED): Heparin Unfractionated: 0.45 IU/mL (ref 0.30–0.70)

## 2016-12-19 LAB — PHOSPHORUS: Phosphorus: 3.4 mg/dL (ref 2.5–4.6)

## 2016-12-19 LAB — LACTIC ACID, PLASMA: LACTIC ACID, VENOUS: 1.2 mmol/L (ref 0.5–1.9)

## 2016-12-19 LAB — MAGNESIUM: MAGNESIUM: 1.3 mg/dL — AB (ref 1.7–2.4)

## 2016-12-19 SURGERY — EGD (ESOPHAGOGASTRODUODENOSCOPY)
Anesthesia: General

## 2016-12-19 MED ORDER — OCTREOTIDE LOAD VIA INFUSION
50.0000 ug | Freq: Once | INTRAVENOUS | Status: AC
  Administered 2016-12-19: 50 ug via INTRAVENOUS
  Filled 2016-12-19: qty 25

## 2016-12-19 MED ORDER — PNEUMOCOCCAL VAC POLYVALENT 25 MCG/0.5ML IJ INJ
0.5000 mL | INJECTION | INTRAMUSCULAR | Status: AC
  Administered 2016-12-21: 0.5 mL via INTRAMUSCULAR
  Filled 2016-12-19: qty 0.5

## 2016-12-19 MED ORDER — DEXTROSE 5 % IV SOLN
2.0000 g | Freq: Two times a day (BID) | INTRAVENOUS | Status: DC
  Administered 2016-12-19 – 2016-12-20 (×3): 2 g via INTRAVENOUS
  Filled 2016-12-19 (×4): qty 2

## 2016-12-19 MED ORDER — VANCOMYCIN HCL IN DEXTROSE 1-5 GM/200ML-% IV SOLN
1000.0000 mg | INTRAVENOUS | Status: DC
  Administered 2016-12-20: 1000 mg via INTRAVENOUS
  Filled 2016-12-19: qty 200

## 2016-12-19 MED ORDER — INSULIN ASPART 100 UNIT/ML ~~LOC~~ SOLN
0.0000 [IU] | SUBCUTANEOUS | Status: DC
  Administered 2016-12-19: 7 [IU] via SUBCUTANEOUS
  Administered 2016-12-19: 4 [IU] via SUBCUTANEOUS
  Administered 2016-12-19: 3 [IU] via SUBCUTANEOUS
  Administered 2016-12-19: 7 [IU] via SUBCUTANEOUS
  Administered 2016-12-19: 3 [IU] via SUBCUTANEOUS
  Administered 2016-12-20 (×2): 4 [IU] via SUBCUTANEOUS
  Filled 2016-12-19 (×7): qty 1

## 2016-12-19 MED ORDER — MAGNESIUM SULFATE 4 GM/100ML IV SOLN
4.0000 g | Freq: Once | INTRAVENOUS | Status: AC
Start: 2016-12-19 — End: 2016-12-19
  Administered 2016-12-19: 4 g via INTRAVENOUS
  Filled 2016-12-19 (×2): qty 100

## 2016-12-19 MED ORDER — SODIUM CHLORIDE 0.9 % IV SOLN
80.0000 mg | Freq: Once | INTRAVENOUS | Status: AC
  Administered 2016-12-19: 11:00:00 80 mg via INTRAVENOUS
  Filled 2016-12-19: qty 80

## 2016-12-19 MED ORDER — ASPIRIN 325 MG PO TABS
325.0000 mg | ORAL_TABLET | Freq: Every day | ORAL | Status: DC
  Administered 2016-12-20 – 2016-12-26 (×7): 325 mg via ORAL
  Filled 2016-12-19 (×7): qty 1

## 2016-12-19 MED ORDER — DEXMEDETOMIDINE HCL IN NACL 400 MCG/100ML IV SOLN
0.4000 ug/kg/h | INTRAVENOUS | Status: DC
  Administered 2016-12-19: 0.5 ug/kg/h via INTRAVENOUS
  Administered 2016-12-19: 0.3 ug/kg/h via INTRAVENOUS
  Filled 2016-12-19 (×2): qty 100

## 2016-12-19 MED ORDER — ASPIRIN 325 MG PO TABS: 325.0000 mg | ORAL_TABLET | Freq: Every day | ORAL | Status: DC

## 2016-12-19 MED ORDER — HEPARIN (PORCINE) IN NACL 100-0.45 UNIT/ML-% IJ SOLN
1600.0000 [IU]/h | INTRAMUSCULAR | Status: DC
  Administered 2016-12-19: 1100 [IU]/h via INTRAVENOUS
  Administered 2016-12-20: 1250 [IU]/h via INTRAVENOUS
  Administered 2016-12-21 – 2016-12-23 (×3): 1400 [IU]/h via INTRAVENOUS
  Filled 2016-12-19 (×5): qty 250

## 2016-12-19 MED ORDER — PANTOPRAZOLE SODIUM 40 MG IV SOLR: 40.0000 mg | Freq: Two times a day (BID) | INTRAVENOUS | Status: DC

## 2016-12-19 MED ORDER — SODIUM CHLORIDE 0.9 % IV SOLN
50.0000 ug/h | INTRAVENOUS | Status: DC
  Administered 2016-12-19: 50 ug/h via INTRAVENOUS
  Filled 2016-12-19: qty 1

## 2016-12-19 MED ORDER — SODIUM CHLORIDE 0.9 % IV SOLN
8.0000 mg/h | INTRAVENOUS | Status: DC
  Administered 2016-12-19 (×2): 8 mg/h via INTRAVENOUS
  Filled 2016-12-19 (×3): qty 80

## 2016-12-19 NOTE — Progress Notes (Signed)
Dr. Marius Ditch present and gave order to d/c hemoglobin order.

## 2016-12-19 NOTE — Progress Notes (Signed)
The patient has ongoing GIB in the setting of acute MI. This is amedical emergency and needs intervention per GI's recommendation, I.e endoscopy.  Multiple attempts have been made to reach the patient's husband on the phone ( [phone], [phone]) but have been unsuccessful. I personally called [phone] but was directed to the voice mail. I left a message for the patient's husband to call us back as this is an emergency necessitating an intervention. I also called [phone] but this number is powered off. The patient's husband has not called [place] back.  Given that GI bleed in setting of an MI is a life threatening condition, proceeding with the endoscopy is in the patient's best interest to prevent danger to her life. As multiple attempts have been made to reach the patient's husband, and that there is no other number (of the husband or any other contact person) in the chart, we will proceed with 'two physician verification and consent' for the endoscopy in the best interest of the patient.   The consent will be signed by myself (Critical Care Medicine) and Dr. Marius Ditch (Gastroenterology).    Nettie Elm, MD Pul,onary and Critical Care Medicine.

## 2016-12-19 NOTE — Progress Notes (Signed)
Per Dr. Marius Ditch discontinue octreotide drip and if extubated advance diet as tolerated.

## 2016-12-19 NOTE — Procedures (Signed)
Central Venous Catheter Insertion Procedure Note CERISE LIEBER [id number] [birth year]  Procedure: Insertion of Central Venous Catheter Indications: Assessment of intravascular volume, Drug and/or fluid administration and Frequent blood sampling  Procedure Details Consent: Unable to obtain consent because of emergent medical necessity. Time Out: Verified patient identification, verified procedure, site/side was marked, verified correct patient position, special equipment/implants available, medications/allergies/relevent history reviewed, required imaging and test results available.  Performed  Maximum sterile technique was used including antiseptics, cap, gloves, gown, hand hygiene, mask and sheet. Skin prep: Chlorhexidine; local anesthetic administered A antimicrobial bonded/coated triple lumen catheter was placed in the right internal jugular vein using the Seldinger technique.  Evaluation Blood flow good Complications: No apparent complications Patient did tolerate procedure well. Chest X-ray ordered to verify placement.  CXR: normal. Procedure performed under direct supervision of Dr.Khan. Ultrasound utilized for realtime vessel cannulation  Magdalene S. [facility] ANP-BC Pulmonary and [place] Pager [phone] or [phone] 12/19/2016, 1:16 AM

## 2016-12-19 NOTE — Progress Notes (Signed)
Tukov NP notified of OG output color change. The output was green/bile color at beginning of shift. Now more brown/maroon. No new orders at this time.

## 2016-12-19 NOTE — Progress Notes (Signed)
Maggie, NP notified that patient passed bedside swallow evaluation. New order received for clear liquid diet tonight and advance to carb modified diet 12/20/2016.

## 2016-12-19 NOTE — Progress Notes (Signed)
Dr. Humphrey Rolls gave order for precedex drip during rounds.  Patient alert and following commands but getting increasingly anxious kicking legs and GI needs to see patient before decision for extubation is made.

## 2016-12-19 NOTE — Progress Notes (Signed)
Initial Nutrition Assessment  DOCUMENTATION CODES:   Obesity unspecified  INTERVENTION:  If patient remains intubated greater than 24-48 hours, recommend initiating adult tube feeding protocol once hemodynamically stable.  Recommend Vital High Protein at 40 mL/hr + Pro-Stat 30 mL BID via OGT. Provides 1360 kcal, 144 grams of protein, 806 mL H2O daily.  If tube feeds are started recommend providing liquid MVI daily per tube as goal tube feed regimen would not meet 100% RDIs for vitamins/minerals.  NUTRITION DIAGNOSIS:   Inadequate oral intake related to inability to eat as evidenced by NPO status.  GOAL:   Provide needs based on ASPEN/SCCM guidelines  MONITOR:   Vent status, Labs, Weight trends, TF tolerance, I & O's  REASON FOR ASSESSMENT:   Ventilator    ASSESSMENT:   61 year old female with PMHx of COPD, CHF, DM type 2, hx hepatitis C s/p treatment, surgical hx of appendectomy and cholecystectomy who presented with respiratory failure and was intubated in the field also found to have GIB, NSTEMI, hypertensive emergency.   -Patient s/p EGD today. Found small blood clot in gastric body that was able to be lavaged. Few non-bleeding linear and superficial gastric ulcers with adherent clot were found in cardia from NGT trauma. Largest lesion (5 mm) was ablated. -Plan is for likely extubation tonight or tomorrow morning.  No family members at bedside at time of RD assessment. Per chart patient was 236.2 lbs on 12/07/2015. She has lost 26.3 lbs (11.1% body weight) over the past year, which is not significant for time frame.  Access: 18 Fr. OGT placed 12/2; tip terminates in stomach per abdominal x-ray 12/2; 55 cm at corner of mouth  MAP: 58-90 mmHg  Patient is currently intubated on ventilator support MV: 10 L/min Temp (24hrs), Avg:98.4 F (36.9 C), Min:97.9 F (36.6 C), Max:99.2 F (37.3 C)  Propofol: N/A  Medications reviewed and include: Novolog 0-20 units Q4hrs,  pantoprazole, NS with KCl 40 mEq/L @ 125 mL/hr, cefepime, Precedex gtt, Levophed gtt currently at 3.8 mL/hr (was off at 1210), pantoprazole, vancomycin.  Labs reviewed: CBG 144-211, CO2 17, Creatinine 1.9. Phosphorus was 3.4 on 12/2.  Patient does not meet criteria for malnutrition at this time.  Discussed with RN and also discussed in rounds.  NUTRITION - FOCUSED PHYSICAL EXAM:    Most Recent Value  Orbital Region  No depletion  Upper Arm Region  No depletion  Thoracic and Lumbar Region  No depletion  Buccal Region  Unable to assess  Temple Region  No depletion  Clavicle Bone Region  No depletion  Clavicle and Acromion Bone Region  No depletion  Scapular Bone Region  Unable to assess  Dorsal Hand  No depletion  Patellar Region  No depletion  Anterior Thigh Region  No depletion  Posterior Calf Region  No depletion  Edema (RD Assessment)  -- [non-pitting edema]  Hair  Reviewed  Eyes  Unable to assess  Mouth  Unable to assess  Skin  Reviewed  Nails  Reviewed     Diet Order:  Diet NPO time specified  EDUCATION NEEDS:   No education needs have been identified at this time  Skin:  Skin Assessment: Reviewed RN Assessment  Last BM:  12/18/2016 - large type 6  Height:   Ht Readings from Last 1 Encounters:  12/18/16 5' 9"  (1.753 m)    Weight:   Wt Readings from Last 1 Encounters:  12/18/16 209 lb 14.1 oz (95.2 kg)    Ideal Body Weight:  65.9 kg  BMI:  Body mass index is 30.99 kg/m.  Estimated Nutritional Needs:   Kcal:  0370-4888 (11-14 kcal/kg)  Protein:  >/= 132 grams (>/= 2 grams/kg IBW)  Fluid:  2-2.3 L/day (30-35 mL/kg IBW)  Willey Blade, MS, RD, LDN Office: [phone] Pager: [phone] After Hours/Weekend Pager: [phone]

## 2016-12-19 NOTE — Progress Notes (Addendum)
Bedside swallow evaluation completed per Burman Nieves, NP. Patient had no difficulty with sips of water from cup,  from straw, or from cracker. Patient swallowed without coughing/choking. Patient reports no difficulty. Lung sounds clear BL post swallow evaluation.

## 2016-12-19 NOTE — Progress Notes (Signed)
RN called and spoke with Dr. Humphrey Rolls and made MD aware of EGD results and that Dr. Marius Ditch said it's ok to restart heparin drip.  MD gave order to restart heparin drip and to wean patient off ventilator and to extubate if patient is tolerating.

## 2016-12-19 NOTE — Progress Notes (Signed)
[facility]  SUBJECTIVE: Intubated and sedated.  Relatively hypotensive when sedated requiring intermittent use of levofed.  Blood from NG tube.   Vitals:   12/19/16 1200 12/19/16 1230 12/19/16 1300 12/19/16 1315  BP: (!) 97/59 100/67 (!) 72/50 (!) 71/49  Pulse: 92 92 89 91  Resp: (!) 24 (!) 24 (!) 24 13  Temp:  99.2 F (37.3 C)    TempSrc:  Axillary    SpO2: 97% 97% 96% 97%  Weight:      Height:        Intake/Output Summary (Last 24 hours) at 12/19/2016 1324 Last data filed at 12/19/2016 1300 Gross per 24 hour  Intake 4404.38 ml  Output 1540 ml  Net 2864.38 ml    LABS: Basic Metabolic Panel: Recent Labs    12/18/16 2327 12/19/16 0435  NA 138 136  K 4.1 4.1  CL 112* 110  CO2 20* 17*  GLUCOSE 102* 179*  BUN 19 19  CREATININE 1.85* 1.90*  CALCIUM 7.7* 7.9*  MG 1.3*  --   PHOS 3.4  --    Liver Function Tests: Recent Labs    12/18/16 0849  AST 24  ALT 10*  ALKPHOS 106  BILITOT 0.4  PROT 7.6  ALBUMIN 2.9*   No results for input(s): LIPASE, AMYLASE in the last 72 hours. CBC: Recent Labs    12/18/16 0849  12/18/16 2327 12/19/16 0435 12/19/16 1039  WBC 12.2*   < > 12.2* 13.9*  --   NEUTROABS 7.3*  --   --   --   --   HGB 13.8   < > 11.0* 11.5* 11.3*  HCT 43.1   < > 34.2* 34.8* 34.4*  MCV 85.2   < > 83.4 83.3  --   PLT 161   < > 115* 125*  --    < > = values in this interval not displayed.   Cardiac Enzymes: Recent Labs    12/18/16 2327 12/19/16 0435 12/19/16 1039  TROPONINI 22.11* 17.52* 18.21*   BNP: Invalid input(s): POCBNP D-Dimer: No results for input(s): DDIMER in the last 72 hours. Hemoglobin A1C: No results for input(s): HGBA1C in the last 72 hours. Fasting Lipid Panel: Recent Labs    12/18/16 1617  TRIG 178*   Thyroid Function Tests: No results for input(s): TSH, T4TOTAL, T3FREE, THYROIDAB in the last 72 hours.  Invalid input(s): FREET3 Anemia Panel: No results for  input(s): VITAMINB12, FOLATE, FERRITIN, TIBC, IRON, RETICCTPCT in the last 72 hours.   Physical Exam: Blood pressure (!) 71/49, pulse 91, temperature 99.2 F (37.3 C), temperature source Axillary, resp. rate 13, height 5' 9"  (1.753 m), weight 95.2 kg (209 lb 14.1 oz), SpO2 97 %.   Wt Readings from Last 1 Encounters:  12/18/16 95.2 kg (209 lb 14.1 oz)     General appearance: Intubated and sedated Resp: rhonchi bilaterally Cardio: regular rate and rhythm Extremities: edema 2-3+ lower extremity edema Neurologic: Mental status: Intubated and sedated  TELEMETRY: Reviewed telemetry pt in sinus tachycardia:  ASSESSMENT AND PLAN:  Principal Problem:   Acute respiratory failure with hypoxia (HCC) clear.  Echo is pending.  Chest x-ray revealed mild pulmonary edema with improvement.  We will continue to wean vent as tolerated.  Maintain pressors as needed although hopefully will be able to stop levo ephedra when sedation is stopped. Active Problems:   PNA (pneumonia)-continue with antibiotic   CHF exacerbation (HCC)-had preserved LV function.  Will evaluate  echo when available.   Acute respiratory failure (HCC)   Sepsis ([place])   Non-ST elevation myocardial infarction-patient has mildly elevated serum troponin.  This may be demand ischemia.  Will evaluate echo for wall motion when available.  Would defer cardiac catheterization until GI bleeding resolves and the patient is stable and extubated.  Okay to remain off of heparin for now.  Would not use beta-blockers due to hypotension. Teodoro Spray, MD, [facility] 12/19/2016 1:24 PM

## 2016-12-19 NOTE — Progress Notes (Signed)
ANTICOAGULATION CONSULT NOTE - Initial Consult  Pharmacy Consult for heparin Indication: chest pain/ACS  Allergies  Allergen Reactions  . Latex Anaphylaxis and Rash  . Gabapentin Nausea And Vomiting    Patient Measurements: Height: 5' 9"  (175.3 cm) Weight: 209 lb 14.1 oz (95.2 kg) IBW/kg (Calculated) : 66.2 Heparin Dosing Weight: 90 kg  Vital Signs: Temp: 98.5 F (36.9 C) (12/03 1630) Temp Source: Axillary (12/03 1630) BP: 145/88 (12/03 1815) Pulse Rate: 99 (12/03 1815)  Labs: Recent Labs    12/18/16 0849 12/18/16 1044 12/18/16 1402  12/18/16 1452  12/18/16 2125 12/18/16 2327 12/19/16 0435 12/19/16 1039  HGB 13.8 13.8 12.4  --   --   --   --  11.0* 11.5* 11.3*  HCT 43.1 42.6 38.0  --   --   --   --  34.2* 34.8* 34.4*  PLT 161 114* 131*  --   --   --   --  115* 125*  --   APTT  --   --   --   --  29  --   --   --   --   --   LABPROT 12.9  --   --   --  13.8  --   --   --   --   --   INR 0.98  --   --   --  1.07  --   --   --   --   --   HEPARINUNFRC  --   --   --   --   --   --  0.45  --  0.45  --   CREATININE 1.27* 1.28*  --   --   --   --   --  1.85* 1.90*  --   TROPONINI 0.04* 0.85*  --    < >  --    < >  --  22.11* 17.52* 18.21*   < > = values in this interval not displayed.    Estimated Creatinine Clearance: 37.7 mL/min (A) (by C-G formula based on SCr of 1.9 mg/dL (H)).   Medical History: Past Medical History:  Diagnosis Date  . CHF (congestive heart failure) ([place])   . COPD (chronic obstructive pulmonary disease) ([place])   . Diabetes mellitus without complication (HCC)     Medications:  Infusions:  . 0.9 % NaCl with KCl 40 mEq / L 125 mL/hr (12/19/16 1707)  . ceFEPime (MAXIPIME) IV Stopped (12/19/16 0851)  . dexmedetomidine (PRECEDEX) IV infusion Stopped (12/19/16 1715)  . heparin 1,100 Units/hr (12/19/16 1815)  . norepinephrine (LEVOPHED) Adult infusion 3 mcg/min (12/19/16 1816)  . pantoprozole (PROTONIX) infusion 8 mg/hr (12/19/16 1110)  .  propofol (DIPRIVAN) infusion Stopped (12/19/16 1631)  . sodium chloride    . [START ON 12/20/2016] vancomycin      Assessment: 61 yof cc respiratory distress, PMH CHF, COPD, DM, tobacco use. Troponin 0.04 - 0.85 -, ECG shows sinus tachycardia, LVH with secondary repolarization abnormality, and prolonged QT interval. Pharmacy consulted to dose UFH for ACS.   Goal of Therapy:  Heparin level 0.3-0.7 units/ml Monitor platelets by anticoagulation protocol: Yes   Plan:  Heparin stopped due to hematemesis. OK to resume heparin drip after EGD per GI. Will resume heparin without bolus at 1100 units/hr and check a HL in 6 hours.   Ulice Dash, PharmD Clinical Pharmacist  12/19/16 6:33 PM

## 2016-12-19 NOTE — Progress Notes (Signed)
Attempted to obtain Upper Endoscopy consent from pts husband Toccara Alford via telephone, however he did not seem cognitively intact to give consent at this time.     Marda Stalker, [place] Pager [phone] (please enter 7 digits) PCCM Consult Pager [phone] (please enter 7 digits)

## 2016-12-19 NOTE — Progress Notes (Signed)
Spoke with Dr Ubaldo Glassing regarding patient's troponin of 17.38 up from  8.86. Per cards, no additional interventions. Continue heparin, ASA, and statin; continue to hold beta blocker in light of shock and trend cardiac enzymes. Will continue to monitor  [name] S. [facility] ANP-BC Pulmonary and Critical Care Medicine [facility] Pager [phone] or [phone]

## 2016-12-19 NOTE — Consult Note (Signed)
Pharmacy Antibiotic Note  [name] is a 61 y.o. female admitted on 12/18/2016 with possible aspiration PNA/CHF exacerbation.  Pharmacy has been consulted for cefepime and vancomycin dosing.  Plan: Will empirically decrease vancomycin dosing to 1000 mg iv q 24 hours due to increase in SCr. Will recheck SCr in AM.   Continue cefepime 2 g iv q 12 hours.   Height: 5' 9"  (175.3 cm) Weight: 209 lb 14.1 oz (95.2 kg) IBW/kg (Calculated) : 66.2  Temp (24hrs), Avg:98.2 F (36.8 C), Min:97.1 F (36.2 C), Max:99.2 F (37.3 C)  Recent Labs  Lab 12/18/16 0849 12/18/16 0850 12/18/16 1044 12/18/16 1402 12/18/16 2327 12/19/16 0435  WBC 12.2*  --  18.1* 15.6* 12.2* 13.9*  CREATININE 1.27*  --  1.28*  --  1.85* 1.90*  LATICACIDVEN  --  5.0* 2.8*  --   --   --     Estimated Creatinine Clearance: 37.7 mL/min (A) (by C-G formula based on SCr of 1.9 mg/dL (H)).    Allergies  Allergen Reactions  . Latex Anaphylaxis and Rash  . Gabapentin Nausea And Vomiting    Antimicrobials this admission: zosyn one dose  vancomycin 12/2 >>  Cefepime 12/2 >>  Dose adjustments this admission:   Microbiology results: 12/2 BCx: Strep species in 1/4- pending 12/2 MRSA PCR: positive  Thank you for allowing pharmacy to be a part of this patient's care.  Ulice Dash, PharmD Clinical Pharmacist  12/19/2016 1:31 PM

## 2016-12-19 NOTE — Progress Notes (Signed)
EGD post procedure note:  Findings: The duodenal bulb and second portion of the duodenum were normal. A small blood clot was found in the gastric body. Lavage of the area was performed using a small amount, resulting in clearance with good visualization. Few non-bleeding linear and superficial gastric ulcers with an adherent clot ([place] Class IIb) were found in the cardia, secondary to NG tube trauma The largest lesion was 5 mm in largest dimension. Fulguration to ablate the lesion to prevent bleeding by heater probe was successful. Estimated blood loss: none. The gastroesophageal junction and examined esophagus were normal.  Recs: - Advance diet as tolerated after extubation - Continue present medications. - Can resume heparin drip and antiplatelet therapy - Continue PPI BID - Can stop octreotide drip  [name], MD [address]  [place]  [place], [place] [address]  Main: [phone]  Fax: [phone] Pager: [phone]

## 2016-12-19 NOTE — Care Management (Addendum)
Presented from home where she lives with her husband for worsening shortness of breath.  Has chronic home oxygen and checking to see if is provided by [facility] .  This CM has made a referral to [facility] for nursing in the past.  Currently intubated but if is not going to require endoscopy, she may be extubated.  PCP is Dr Leonides Sake at [facility]. Patient has had referral to [facility] but not sure if she kept appointment.  This CM has provided this patient with a set of scales in 2017

## 2016-12-19 NOTE — Progress Notes (Signed)
[place] [facility] [place] NAME: [name]    MR#:  [id number]  DATE OF BIRTH:  10-14-1955  SUBJECTIVE:  CHIEF COMPLAINT:   Chief Complaint  Patient presents with  . Respiratory Distress     Came with respi distress, Intubated in ER. Have high troponin and was on heparine drip, but started to have Gi bleed. Today BP stable, off levophed and tapered sedation on SBP in morning.  REVIEW OF SYSTEMS:   On vent support. ROS  DRUG ALLERGIES:   Allergies  Allergen Reactions  . Latex Anaphylaxis and Rash  . Gabapentin Nausea And Vomiting    VITALS:  Blood pressure 111/67, pulse 93, temperature 98.9 F (37.2 C), temperature source Oral, resp. rate 14, height 5' 9"  (1.753 m), weight 95.2 kg (209 lb 14.1 oz), SpO2 93 %.  PHYSICAL EXAMINATION:   GENERAL:  61 y.o.-year-old patient lying in the bed with  acute distress.  EYES: Pupils equal, round, reactive to light and accommodation. No scleral icterus. Extraocular muscles intact.  HEENT: Head atraumatic, normocephalic. Oropharynx and nasopharynx clear. NGT have blood. NECK:  Supple, no jugular venous distention. No thyroid enlargement, no tenderness.  LUNGS: Normal breath sounds bilaterally, no wheezing,  or crepitation. No use of accessory muscles of respiration. ETT and vent support. CARDIOVASCULAR: S1, S2 normal. No murmurs, rubs, or gallops.  ABDOMEN: Soft, nontender, nondistended. Bowel sounds present. No organomegaly or mass.  EXTREMITIES: No pedal edema, cyanosis, or clubbing.  NEUROLOGIC: right pupil fix, left pupil responsive to light. Off sedation and follows simple commands. PSYCHIATRIC: The patient is intubated.  SKIN: No obvious rash, lesion, or ulcer.   Physical Exam LABORATORY PANEL:   CBC Recent Labs  Lab 12/19/16 0435 12/19/16 1039  WBC 13.9*  --   HGB 11.5* 11.3*  HCT 34.8* 34.4*  PLT 125*  --     ------------------------------------------------------------------------------------------------------------------  Chemistries  Recent Labs  Lab 12/18/16 0849  12/18/16 2327 12/19/16 0435  NA 134*   < > 138 136  K 2.6*   < > 4.1 4.1  CL 102   < > 112* 110  CO2 17*   < > 20* 17*  GLUCOSE 518*   < > 102* 179*  BUN 15   < > 19 19  CREATININE 1.27*   < > 1.85* 1.90*  CALCIUM 8.3*   < > 7.7* 7.9*  MG  --   --  1.3*  --   AST 24  --   --   --   ALT 10*  --   --   --   ALKPHOS 106  --   --   --   BILITOT 0.4  --   --   --    < > = values in this interval not displayed.   ------------------------------------------------------------------------------------------------------------------  Cardiac Enzymes Recent Labs  Lab 12/19/16 0435 12/19/16 1039  TROPONINI 17.52* 18.21*   ------------------------------------------------------------------------------------------------------------------  RADIOLOGY:  Ct Head Wo Contrast  Result Date: 12/18/2016 CLINICAL DATA:  Respiratory failure and status post intubation. EXAM: CT HEAD WITHOUT CONTRAST TECHNIQUE: Contiguous axial images were obtained from the base of the skull through the vertex without intravenous contrast. COMPARISON:  11/07/2012 FINDINGS: Brain: No acute hemorrhage identified. There is an old appearing lacunar infarct along the posterior aspect of the left caudate nucleus where it borders the internal capsule. No acute appearing cerebral infarct identified by CT. No evidence of mass effect, mass lesion, hydrocephalus or extra-axial fluid collection. Vascular: No  hyperdense vessel or unexpected calcification. Skull: Normal. Negative for fracture or focal lesion. Sinuses/Orbits: No acute finding. Other: None. IMPRESSION: No acute findings. 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 Electronically Signed   By: Aletta Edouard M.D.   On: 12/18/2016 13:19   Dg Chest Port 1 View  Result  Date: 12/18/2016 CLINICAL DATA:  Central line placement. EXAM: PORTABLE CHEST 1 VIEW COMPARISON:  12/18/2016 and prior radiograph FINDINGS: A right IJ central venous catheter has been placed with tip overlying the superior cavoatrial junction. There is no evidence of pneumothorax. Cardiomegaly, improved pulmonary edema, bilateral lower lung atelectasis/ airspace disease and probable effusions noted. An endotracheal tube with tip 2.3 cm above the carina and NG tube entering the stomach with tip off the field of view noted. IMPRESSION: Right IJ central venous catheter with tip overlying the superior cavoatrial junction. No evidence of pneumothorax. Cardiomegaly with improved pulmonary edema. Continued bilateral lower lung atelectasis/ airspace disease and probable effusions. Electronically Signed   By: Margarette Canada M.D.   On: 12/18/2016 23:20   Dg Chest Portable 1 View  Result Date: 12/18/2016 CLINICAL DATA:  Respiratory distress and status post intubation. EXAM: PORTABLE CHEST 1 VIEW COMPARISON:  12/05/2015 FINDINGS: Endotracheal tube present with the tip approximately 4 cm above the carina. There is new consolidative airspace disease in the right lower lung consistent with pneumonia/ aspiration. Airspace disease is also suspected in the left perihilar lung. No pneumothorax or significant pleural effusions identified. The heart size is stable and within normal limits. Pacing pads present. IMPRESSION: 1. Endotracheal tube tip approximately 4 cm above the carina. 2. New airspace disease in the right lower lung consistent with pneumonia/aspiration. Airspace disease is also suspected in the left perihilar lung. Electronically Signed   By: Aletta Edouard M.D.   On: 12/18/2016 09:08   Dg Abd Portable 1 View  Result Date: 12/18/2016 CLINICAL DATA:  OG tube placement EXAM: PORTABLE ABDOMEN - 1 VIEW COMPARISON:  12/05/2015 FINDINGS: Enteric tube tip is in the proximal to mid stomach with the side port in the proximal  stomach. IMPRESSION: Enteric tube tip in the stomach. Electronically Signed   By: Rolm Baptise M.D.   On: 12/18/2016 09:33   US Abdomen Limited Ruq  Result Date: 12/19/2016 CLINICAL DATA:  Thrombocytopenia. EXAM: ULTRASOUND ABDOMEN LIMITED RIGHT UPPER QUADRANT COMPARISON:  CT scan of March 12, 2012. FINDINGS: Gallbladder: Status post cholecystectomy. Common bile duct: Diameter: 4 mm which is within normal limits. Liver: Increased echogenicity of hepatic parenchyma is noted with mildly nodular hepatic contours suggesting cirrhosis. No focal abnormality is noted. Portal vein is patent on color Doppler imaging with normal direction of blood flow towards the liver. IMPRESSION: Status post cholecystectomy. Increased echogenicity of hepatic parenchyma is noted with slightly nodular hepatic margins suggesting possible hepatic cirrhosis. Electronically Signed   By: Marijo Conception, M.D.   On: 12/19/2016 15:40    ASSESSMENT AND PLAN:   Principal Problem:   Acute respiratory failure with hypoxia (HCC) Active Problems:   PNA (pneumonia)   CHF exacerbation (HCC)   Acute respiratory failure (HCC)   Sepsis (HCC)   Coffee ground emesis   NSTEMI (non-ST elevated myocardial infarction) ([place])   * Ac respi failure   Ac Diastolic CHF exacerbation   Pneumonia and sepsis    ETT and vent support, monitor in ICU   Vanc + cefepime,  Bl cx sent- one bottle have strep species , positive MRSA screen.  IV fluid support, due to Hypotension   was hypotensive, needed levophed, off now.  * NSTEMI   Heparine drip started, but stopped due to GI bleed.    Echo.   Cardio on case, no invasive work ups now, due to critical condition.  * GI bleed   EGD planned by GI.   Stopped heparine.   PPI and octreotide.  * DM with uncontrolled blood sugar.   Bl sugar was high with CO2 of 17 and K was < 3.   on Glucostabilizer drip by ICU.  * COPD    Does not have active wheeze.    on duoneb.  * CKD stage 3    Monitor.    All the records are reviewed and case discussed with Care Management/Social Workerr. Management plans discussed with the patient, family and they are in agreement.  CODE STATUS: full.  TOTAL TIME TAKING CARE OF THIS PATIENT: 35 minutes.     POSSIBLE D/C IN 1-2 DAYS, DEPENDING ON CLINICAL CONDITION.   Vaughan Basta M.D on 12/19/2016   Between 7am to 6pm - Pager - [phone]  After 6pm go to [email or web address] - password [email or web address] [facility]  Office  [phone]  CC: Primary care physician; [facility]  Note: This dictation was prepared with Dragon dictation along with smaller phrase technology. Any transcriptional errors that result from this process are unintentional.

## 2016-12-19 NOTE — Consult Note (Signed)
Tanya Darby, MD [address]  [place]  [place], [place] [address]  Main: [phone]  Fax: [phone] Pager: [phone]   Consultation  Referring Provider:     No ref. provider found Primary Care Physician:  [place], [place] [facility] Primary Gastroenterologist: None         Reason for Consultation:     Hematemesis  Date of Admission:  12/18/2016 Date of Consultation:  12/19/2016         HPI:   [name] is a 61 y.o. female with CHF, COPD, DM admitted with acute respiratory distress. She is currently intubated. She is on asa 81 and heparin for nSTEMI. ICU NP called me today afternoon in consultation for hematemesis. Dark maroon blood coming out of OG tube. Apparently, she had chronic hep C and treated in the past, confirmed eradication. Her Hb and plts slightly dropped since admission and currently not on pressors. ICU staff denied melena or rectal bleeding. She is started on PPI drip and octreotide drips  NSAIDs: unknown  Antiplts/Anticoagulants/Anti thrombotics: asa, heparin drip  GI Procedures: unknown  Past Medical History:  Diagnosis Date  . CHF (congestive heart failure) ([place])   . COPD (chronic obstructive pulmonary disease) ([place])   . Diabetes mellitus without complication [facility])     Past Surgical History:  Procedure Laterality Date  . APPENDECTOMY    . CHOLECYSTECTOMY      Prior to Admission medications   Medication Sig Start Date End Date Taking? Authorizing Provider  pregabalin (LYRICA) 100 MG capsule Take 100 mg by mouth 3 (three) times daily.   Yes [provider]    Family History  Problem Relation Age of Onset  . Hypertension Mother      Social History   Tobacco Use  . Smoking status: Current Every Day Smoker    Packs/day: 0.25    Types: Cigarettes  . Smokeless tobacco: Never Used  Substance Use Topics  . Alcohol use: No  . Drug use: No    Allergies as of 12/18/2016 - Review Complete 12/18/2016    Allergen Reaction Noted  . Latex Anaphylaxis and Rash 11/07/2012  . Gabapentin Nausea And Vomiting 12/20/2012    Review of Systems:    All systems reviewed and negative except where noted in HPI.   Physical Exam:  Vital signs in last 24 hours: Temp:  [97.1 F (36.2 C)-99.2 F (37.3 C)] 99.2 F (37.3 C) (12/03 1230) Pulse Rate:  [75-110] 97 (12/03 1530) Resp:  [0-31] 18 (12/03 1530) BP: (69-122)/(38-80) 97/60 (12/03 1530) SpO2:  [94 %-100 %] 96 % (12/03 1530) FiO2 (%):  [24 %-40 %] 24 % (12/03 1545) Last BM Date: 12/18/16 General:  Intubated, sedated Head:  Normocephalic and atraumatic. Eyes:   No icterus.   Conjunctiva pink. PERRLA. Ears:  Normal auditory acuity. Neck:  Supple; no masses or thyroidomegaly Lungs: Respirations even and unlabored. Lungs clear to auscultation bilaterally.   No wheezes, crackles, or rhonchi.  Heart:  Regular rate and rhythm;  Without murmur, clicks, rubs or gallops Abdomen:  Soft, nondistended, nontender. Normal bowel sounds. No appreciable masses or hepatomegaly.  No rebound or guarding.  Rectal:  Not performed. Msk:  Symmetrical without gross deformities.  Extremities:  Without edema, cyanosis or clubbing. Neurologic:  intubated  LAB RESULTS: CBC Latest Ref Rng & Units 12/19/2016 12/19/2016 12/18/2016  WBC 3.6 - 11.0 K/uL - 13.9(H) 12.2(H)  Hemoglobin 12.0 - 16.0 g/dL 11.3(L) 11.5(L) 11.0(L)  Hematocrit 35.0 -  47.0 % 34.4(L) 34.8(L) 34.2(L)  Platelets 150 - 440 K/uL - 125(L) 115(L)    BMET BMP Latest Ref Rng & Units 12/19/2016 12/18/2016 12/18/2016  Glucose 65 - 99 mg/dL 179(H) 102(H) 444(H)  BUN 6 - 20 mg/dL _0 Creatinine 0.44 - 1.00 mg/dL 1.90(H) 1.85(H) 1.28(H)  Sodium 135 - 145 mmol/L 136 138 135  Potassium 3.5 - 5.1 mmol/L 4.1 4.1 4.0  Chloride 101 - 111 mmol/L 110 112(H) 104  CO2 22 - 32 mmol/L 17(L) 20(L) 18(L)  Calcium 8.9 - 10.3 mg/dL 7.9(L) 7.7(L) 8.1(L)    LFT Hepatic Function Latest Ref Rng & Units 12/18/2016  12/05/2015 12/04/2015  Total Protein 6.5 - 8.1 g/dL 7.6 7.7 8.8(H)  Albumin 3.5 - 5.0 g/dL 2.9(L) 3.2(L) 3.3(L)  AST 15 - 41 U/L 24 14(L) 39  ALT 14 - 54 U/L 10(L) 11(L) 15  Alk Phosphatase 38 - 126 U/L 106 108 144(H)  Total Bilirubin 0.3 - 1.2 mg/dL 0.4 0.2(L) 0.6     STUDIES: Ct Head Wo Contrast  Result Date: 12/18/2016 CLINICAL DATA:  Respiratory failure and status post intubation. EXAM: CT HEAD WITHOUT CONTRAST TECHNIQUE: Contiguous axial images were obtained from the base of the skull through the vertex without intravenous contrast. COMPARISON:  11/07/2012 FINDINGS: Brain: No acute hemorrhage identified. There is an old appearing lacunar infarct along the posterior aspect of the left caudate nucleus where it borders the internal capsule. No acute appearing cerebral infarct identified by CT. No evidence of mass effect, mass lesion, hydrocephalus or extra-axial fluid collection. Vascular: No hyperdense vessel or unexpected calcification. Skull: Normal. Negative for fracture or focal lesion. Sinuses/Orbits: No acute finding. Other: None. IMPRESSION: No acute findings. 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 Electronically Signed   By: Aletta Edouard M.D.   On: 12/18/2016 13:19   Dg Chest Port 1 View  Result Date: 12/18/2016 CLINICAL DATA:  Central line placement. EXAM: PORTABLE CHEST 1 VIEW COMPARISON:  12/18/2016 and prior radiograph FINDINGS: A right IJ central venous catheter has been placed with tip overlying the superior cavoatrial junction. There is no evidence of pneumothorax. Cardiomegaly, improved pulmonary edema, bilateral lower lung atelectasis/ airspace disease and probable effusions noted. An endotracheal tube with tip 2.3 cm above the carina and NG tube entering the stomach with tip off the field of view noted. IMPRESSION: Right IJ central venous catheter with tip overlying the superior cavoatrial junction. No evidence of pneumothorax.  Cardiomegaly with improved pulmonary edema. Continued bilateral lower lung atelectasis/ airspace disease and probable effusions. Electronically Signed   By: Margarette Canada M.D.   On: 12/18/2016 23:20   Dg Chest Portable 1 View  Result Date: 12/18/2016 CLINICAL DATA:  Respiratory distress and status post intubation. EXAM: PORTABLE CHEST 1 VIEW COMPARISON:  12/05/2015 FINDINGS: Endotracheal tube present with the tip approximately 4 cm above the carina. There is new consolidative airspace disease in the right lower lung consistent with pneumonia/ aspiration. Airspace disease is also suspected in the left perihilar lung. No pneumothorax or significant pleural effusions identified. The heart size is stable and within normal limits. Pacing pads present. IMPRESSION: 1. Endotracheal tube tip approximately 4 cm above the carina. 2. New airspace disease in the right lower lung consistent with pneumonia/aspiration. Airspace disease is also suspected in the left perihilar lung. Electronically Signed   By: Aletta Edouard M.D.   On: 12/18/2016 09:08   Dg Abd Portable 1 View  Result Date: 12/18/2016 CLINICAL DATA:  OG tube placement EXAM: PORTABLE ABDOMEN - 1 VIEW COMPARISON:  12/05/2015 FINDINGS: Enteric tube tip is in the proximal to mid stomach with the side port in the proximal stomach. IMPRESSION: Enteric tube tip in the stomach. Electronically Signed   By: Rolm Baptise M.D.   On: 12/18/2016 09:33   US Abdomen Limited Ruq  Result Date: 12/19/2016 CLINICAL DATA:  Thrombocytopenia. EXAM: ULTRASOUND ABDOMEN LIMITED RIGHT UPPER QUADRANT COMPARISON:  CT scan of March 12, 2012. FINDINGS: Gallbladder: Status post cholecystectomy. Common bile duct: Diameter: 4 mm which is within normal limits. Liver: Increased echogenicity of hepatic parenchyma is noted with mildly nodular hepatic contours suggesting cirrhosis. No focal abnormality is noted. Portal vein is patent on color Doppler imaging with normal direction of blood  flow towards the liver. IMPRESSION: Status post cholecystectomy. Increased echogenicity of hepatic parenchyma is noted with slightly nodular hepatic margins suggesting possible hepatic cirrhosis. Electronically Signed   By: Marijo Conception, M.D.   On: 12/19/2016 15:40      Impression / Plan:   [name] is a 61 y.o. female with DM, HTN, CHF, COPD admitted with hypoxic respiratory failure and nSTEMI consulted for hematemesis, hemodynamically stable, chronic hep C s/p treatment  - Recommend urgent EGD - Unable to contact family despite several attempts - Will proceed with EGD with two physician consent - Continue ppi and octreotide drips - Monitor CBC closely  Thank you for involving me in the care of this patient.      LOS: 1 day   Sherri Sear, MD  12/19/2016, 3:51 PM   Note: This dictation was prepared with Dragon dictation along with smaller phrase technology. Any transcriptional errors that result from this process are unintentional.

## 2016-12-19 NOTE — Progress Notes (Signed)
*  PRELIMINARY RESULTS* Echocardiogram 2D Echocardiogram+ has been performed.  [name] 12/19/2016, 9:47 AM

## 2016-12-19 NOTE — Progress Notes (Signed)
Pt extubated without complications, no stridor noted, respiratory rate 18/min,  sats 95% on roomair, will continue to monitor

## 2016-12-19 NOTE — Progress Notes (Signed)
2 silver colored rings with clear stones sent home with patient's niece Tito Dine.

## 2016-12-19 NOTE — Progress Notes (Signed)
Patient passed SBT earlier in the day. RSBI was in the 30s. She had a good cough and met all the other parameters needed for extubation.  She was kept intubated for the EGD and after the procedure, given her hemodynamic stability and the EGD results, the decision was made to extubate the patient.  Orders given to the RN.  Later, I checked on the patient in person. She was resting comfortably with stable vitals.

## 2016-12-19 NOTE — Progress Notes (Signed)
Maggie, NP at bedside to see patient. Verbal order for incentive spirometer obtained. Clarified new order for Aspirin via OG ok to give PO if patient passes bedside swallow evalution. Patient reports history of dysphagia which recommends the bedside swallow evaluation be stopped and consult for speech therapy recommended. Will advise ICU, NP.

## 2016-12-19 NOTE — Progress Notes (Signed)
PHARMACY - PHYSICIAN COMMUNICATION CRITICAL VALUE ALERT - BLOOD CULTURE IDENTIFICATION (BCID)  ROWEN HUR is an 61 y.o. female who presented to [facility] on 12/18/2016 with a chief complaint of multiple medical issues  Assessment:  BCID Strep spp. 1/4.  (include suspected source if known)  Name of physician (or Provider) Contacted: T[place]  Current antibiotics: Vanc and cefepime  Changes to prescribed antibiotics recommended: Continue current regimen for now.  No results found for this or any previous visit.  Mathews Stuhr S 12/19/2016  7:05 AM

## 2016-12-19 NOTE — Op Note (Signed)
[facility] Patient Name: [name] Procedure Date: 12/19/2016 3:48 PM MRN: [id number] Account #: [id number] Date of Birth: Dec 13, 1955 Admit Type: Inpatient Age: 61 Room: [facility] ENDO ROOM 4 Gender: Female Note Status: Finalized Procedure:            Upper GI endoscopy Indications:          Coffee-ground emesis Providers:            Lin Landsman MD, MD Referring MD:         No Local Md, MD (Referring MD) Medicines:            General Anesthesia Complications:        No immediate complications. Estimated blood loss: None. Procedure:            Pre-Anesthesia Assessment:                       - Prior to the procedure, a History and Physical was                        performed, and patient medications and allergies were                        reviewed. The patient is unable to give consent                        secondary to the patient being legally incompetent to                        consent. The risks and benefits of the procedure and                        the sedation options and risks were discussed with the                        patient. All questions were answered and informed                        consent was obtained. Patient identification and                        proposed procedure were verified by the physician, the                        nurse and the technician in the procedure room. Mental                        Status Examination: sedated. Airway Examination:                        orotracheal intubation. Respiratory Examination: clear                        to auscultation. CV Examination: normal. Prophylactic                        Antibiotics: The patient does not require prophylactic                        antibiotics. Prior Anticoagulants: The patient has  taken heparin, last dose was day of procedure. ASA                        Grade Assessment: IV - A patient with severe systemic                disease that is a constant threat to life. After                        reviewing the risks and benefits, the patient was                        deemed in satisfactory condition to undergo the                        procedure. The anesthesia plan was to use general                        anesthesia. Immediately prior to administration of                        medications, the patient was re-assessed for adequacy                        to receive sedatives. The heart rate, respiratory rate,                        oxygen saturations, blood pressure, adequacy of                        pulmonary ventilation, and response to care were                        monitored throughout the procedure. The physical status                        of the patient was re-assessed after the procedure.                       After obtaining informed consent, the endoscope was                        passed under direct vision. Throughout the procedure,                        the patient's blood pressure, pulse, and oxygen                        saturations were monitored continuously. The Endoscope                        was introduced through the mouth, and advanced to the                        second part of duodenum. The upper GI endoscopy was                        accomplished without difficulty. The patient tolerated  the procedure well. Findings:      The duodenal bulb and second portion of the duodenum were normal.      Blood clot was found in the gastric body. Lavage of the area was       performed using a small amount, resulting in clearance with good       visualization.      Few non-bleeding linear and superficial gastric ulcers with an adherent       clot (Forrest Class IIb) were found in the cardia, secondary to NG tube       trauma The largest lesion was 5 mm in largest dimension. Fulguration to       ablate the lesion to prevent bleeding by heater probe was  successful.       Estimated blood loss: none.      The gastroesophageal junction and examined esophagus were normal. Impression:           - Normal duodenal bulb and second portion of the                        duodenum.                       - Blood clot in the gastric body.                       - Non-bleeding gastric ulcers with an adherent clot                        (Forrest Class IIb). Treated with a heater probe.                       - Normal gastroesophageal junction and esophagus.                       - No specimens collected. Recommendation:       - Return patient to ICU for ongoing care.                       - Advance diet as tolerated after extubation                       - Continue present medications.                       - Continue PPI BID                       - Can stop octreotide drip Procedure Code(s):    --- Professional ---                       [id number], Esophagogastroduodenoscopy, flexible, transoral;                        with control of bleeding, any method Diagnosis Code(s):    --- Professional ---                       K92.2, Gastrointestinal hemorrhage, unspecified                       K25.4, Chronic or unspecified gastric ulcer with  hemorrhage                       K92.0, Hematemesis CPT copyright 2016 [employer]. All rights reserved. The codes documented in this report are preliminary and upon coder review may  be revised to meet current compliance requirements. Dr. Ulyess Mort Lin Landsman MD, MD 12/19/2016 4:34:10 PM This report has been signed electronically. Number of Addenda: 0 Note Initiated On: 12/19/2016 3:48 PM      [facility]

## 2016-12-19 NOTE — Progress Notes (Signed)
[facility] Consultation     Date: 12/19/2016,   MRN# [id number] [name] [birth year]   SUBJECTIVE:  Patient awake on the ventilator.  Following commands.  Able to lift her head off the bed on command.  Moving all 4 extremities without any issues. Pupillary examination remains unchanged compared to yesterday.  Right pupil is dilated and unresponsive.  Left is normal and responsive. OG tube has dark maroon blood.  Hemoglobin is stable.  Patient is on heparin drip. Blood pressures have remained stable.  Urine output is adequate. Vent requirements are minimal at 500/18/30/5.  ABG this morning looks acceptable. Lab review shows some metabolic acidosis with a bicarbonate of 17.  Creatinine is elevated but stable. Troponins are downtrending. Blood cultures, 1/4 are positive for strep.  Speciation awaited.  ent awake on the ventilator.  Following commands. Able to lift her head off the bed on command.  Moving all 4 extremities without any prob MEDICATIONS    Home Medication:    Current Medication:   Current Facility-Administered Medications:  .  0.9 % NaCl with KCl 40 mEq / L  infusion, , Intravenous, Continuous, Vaughan Basta, MD, Last Rate: 125 mL/hr at 12/19/16 0756, 125 mL/hr at 12/19/16 0756 .  atorvastatin (LIPITOR) tablet 80 mg, 80 mg, Per Tube, q1800, Nettie Elm, MD, 80 mg at 12/18/16 1627 .  ceFEPIme (MAXIPIME) 2 g in dextrose 5 % 50 mL IVPB, 2 g, Intravenous, Q12H, Nettie Elm, MD, Stopped at 12/19/16 [id number] .  chlorhexidine gluconate (MEDLINE KIT) (PERIDEX) 0.12 % solution 15 mL, 15 mL, Mouth Rinse, BID, Tukov, Magadalene S, NP, 15 mL at 12/19/16 0746 .  Chlorhexidine Gluconate Cloth 2 % PADS 6 each, 6 each, Topical, Q0600, Nettie Elm, MD, 6 each at 12/19/16 0531 .  dexmedetomidine (PRECEDEX) 400 MCG/100ML (4 mcg/mL) infusion, 0.4-1.2 mcg/kg/hr, Intravenous, Titrated, Nettie Elm, MD, Last Rate: 9.5 mL/hr at 12/19/16 1056, 0.4 mcg/kg/hr at  12/19/16 1056 .  docusate sodium (COLACE) capsule 100 mg, 100 mg, Oral, BID PRN, Vaughan Basta, MD .  fentaNYL (SUBLIMAZE) injection 50 mcg, 50 mcg, Intravenous, Q2H PRN, Nettie Elm, MD, 50 mcg at 12/18/16 2223 .  insulin aspart (novoLOG) injection 0-20 Units, 0-20 Units, Subcutaneous, Q4H, Tukov, Magadalene S, NP, 3 Units at 12/19/16 0816 .  ipratropium-albuterol (DUONEB) 0.5-2.5 (3) MG/3ML nebulizer solution 3 mL, 3 mL, Nebulization, Q6H, Nettie Elm, MD, 3 mL at 12/19/16 0729 .  MEDLINE mouth rinse, 15 mL, Mouth Rinse, 10 times per day, Tukov, Magadalene S, NP, 15 mL at 12/19/16 1036 .  mupirocin ointment (BACTROBAN) 2 % 1 application, 1 application, Nasal, BID, Nettie Elm, MD, 1 application at 03/47/42 1032 .  norepinephrine (LEVOPHED) 16 mg in dextrose 5 % 250 mL (0.064 mg/mL) infusion, 0-65 mcg/min, Intravenous, Titrated, Tukov, Magadalene S, NP, Last Rate: 1.9 mL/hr at 12/19/16 1130, 2 mcg/min at 12/19/16 1130 .  pantoprazole (PROTONIX) 80 mg in sodium chloride 0.9 % 250 mL (0.32 mg/mL) infusion, 8 mg/hr, Intravenous, Continuous, Nettie Elm, MD, Last Rate: 25 mL/hr at 12/19/16 1110, 8 mg/hr at 12/19/16 1110 .  [START ON 12/22/2016] pantoprazole (PROTONIX) injection 40 mg, 40 mg, Intravenous, Q12H, Nettie Elm, MD .  propofol (DIPRIVAN) 1000 MG/100ML infusion, 5-80 mcg/kg/min, Intravenous, Titrated, Nettie Elm, MD, Stopped at 12/19/16 0930 .  [COMPLETED] sodium chloride 0.9 % bolus 1,000 mL, 1,000 mL, Intravenous, Once, Stopped at 12/18/16 1107 **AND** sodium chloride 0.9 % bolus 1,000 mL, 1,000 mL, Intravenous, Once **AND** [COMPLETED] sodium chloride 0.9 % bolus 500  mL, 500 mL, Intravenous, Once, Schuyler Amor, MD, Stopped at 12/18/16 1044 .  vancomycin (VANCOCIN) IVPB 1000 mg/200 mL premix, 1,000 mg, Intravenous, Q12H, Ramond Dial, RPH, Stopped at 12/19/16 0516    VS: BP 92/65   Pulse 90   Temp 98.5 F (36.9 C) (Axillary)   Resp (!) 24   Ht 5' 9"  (1.753 m)    Wt 209 lb 14.1 oz (95.2 kg)   SpO2 96%   BMI 30.99 kg/m      PHYSICAL EXAM   Physical Exam    Orally intubated and mechanically ventilated. Awake, following commands.  Moving all 4 extremities.  Able to lift her head off the bed. Right pupil fixed and dilated.  Left pupil normal. Sclera anicteric OG output has maroon blood. CVS S1, S2 0 Chest: Clear to auscultation bilaterally with no rales rhonchi or wheezes. Abdomen is obese, soft, nontender. No gross extremity deformity      LABS    Recent Labs    12/18/16 0849 12/18/16 1044 12/18/16 1402 12/18/16 1452 12/18/16 2327 12/19/16 0435 12/19/16 1039  HGB 13.8 13.8 12.4  --  11.0* 11.5* 11.3*  HCT 43.1 42.6 38.0  --  34.2* 34.8* 34.4*  MCV 85.2 83.6 83.7  --  83.4 83.3  --   WBC 12.2* 18.1* 15.6*  --  12.2* 13.9*  --   BUN 15 15  --   --  19 19  --   CREATININE 1.27* 1.28*  --   --  1.85* 1.90*  --   GLUCOSE 518* 444*  --   --  102* 179*  --   CALCIUM 8.3* 8.1*  --   --  7.7* 7.9*  --   INR 0.98  --   --  1.07  --   --   --   ,    No results for input(s): PH in the last 72 hours.  Invalid input(s): PCO2, PO2, BASEEXCESS, BASEDEFICITE, TFT    CULTURE RESULTS   Recent Results (from the past 240 hour(s))  Culture, blood (routine x 2)     Status: None (Preliminary result)   Collection Time: 12/18/16  8:49 AM  Result Value Ref Range Status   Specimen Description BLOOD RIGHT ANTECUBITAL  Final   Special Requests   Final    BOTTLES DRAWN AEROBIC AND ANAEROBIC Blood Culture adequate volume   Culture  Setup Time   Final    GRAM POSITIVE COCCI ANAEROBIC BOTTLE ONLY CRITICAL RESULT CALLED TO, READ BACK BY AND VERIFIED WITH: MATT Greene AT [place] ON 12/19/16 [place].    Culture GRAM POSITIVE COCCI  Final   Report Status PENDING  Incomplete  Culture, blood (routine x 2)     Status: None (Preliminary result)   Collection Time: 12/18/16  8:49 AM  Result Value Ref Range Status   Specimen Description BLOOD LEFT  ANTECUBITAL  Final   Special Requests   Final    BOTTLES DRAWN AEROBIC AND ANAEROBIC Blood Culture adequate volume   Culture NO GROWTH < 24 HOURS  Final   Report Status PENDING  Incomplete  Blood Culture ID Panel (Reflexed)     Status: Abnormal   Collection Time: 12/18/16  8:49 AM  Result Value Ref Range Status   Enterococcus species NOT DETECTED NOT DETECTED Final   Listeria monocytogenes NOT DETECTED NOT DETECTED Final   Staphylococcus species NOT DETECTED NOT DETECTED Final   Staphylococcus aureus NOT DETECTED NOT DETECTED Final   Streptococcus species DETECTED (A)  NOT DETECTED Final    Comment: Not Enterococcus species, Streptococcus agalactiae, Streptococcus pyogenes, or Streptococcus pneumoniae. CRITICAL RESULT CALLED TO, READ BACK BY AND VERIFIED WITH: MATT MCBANE AT 0701 ON 12/19/16 [place].    Streptococcus agalactiae NOT DETECTED NOT DETECTED Final   Streptococcus pneumoniae NOT DETECTED NOT DETECTED Final   Streptococcus pyogenes NOT DETECTED NOT DETECTED Final   Acinetobacter baumannii NOT DETECTED NOT DETECTED Final   Enterobacteriaceae species NOT DETECTED NOT DETECTED Final   Enterobacter cloacae complex NOT DETECTED NOT DETECTED Final   Escherichia coli NOT DETECTED NOT DETECTED Final   Klebsiella oxytoca NOT DETECTED NOT DETECTED Final   Klebsiella pneumoniae NOT DETECTED NOT DETECTED Final   Proteus species NOT DETECTED NOT DETECTED Final   Serratia marcescens NOT DETECTED NOT DETECTED Final   Haemophilus influenzae NOT DETECTED NOT DETECTED Final   Neisseria meningitidis NOT DETECTED NOT DETECTED Final   Pseudomonas aeruginosa NOT DETECTED NOT DETECTED Final   Candida albicans NOT DETECTED NOT DETECTED Final   Candida glabrata NOT DETECTED NOT DETECTED Final   Candida krusei NOT DETECTED NOT DETECTED Final   Candida parapsilosis NOT DETECTED NOT DETECTED Final   Candida tropicalis NOT DETECTED NOT DETECTED Final  Culture, respiratory (NON-Expectorated)     Status:  None (Preliminary result)   Collection Time: 12/18/16  3:01 PM  Result Value Ref Range Status   Specimen Description TRACHEAL ASPIRATE  Final   Special Requests NONE  Final   Gram Stain   Final    FEW WBC PRESENT, PREDOMINANTLY PMN NO ORGANISMS SEEN    Culture   Final    NO GROWTH < 24 HOURS Performed at [facility], [address], [place], [place] [address]    Report Status PENDING  Incomplete  MRSA PCR Screening     Status: Abnormal   Collection Time: 12/18/16  4:38 PM  Result Value Ref Range Status   MRSA by PCR POSITIVE (A) NEGATIVE Final    Comment:        The GeneXpert MRSA Assay (FDA approved for NASAL specimens only), is one component of a comprehensive MRSA colonization surveillance program. It is not intended to diagnose MRSA infection nor to guide or monitor treatment for MRSA infections. RESULT CALLED TO, READ BACK BY AND VERIFIED WITH: BARBARA ALLEN 12/18/16 AT 1800 BY HS           IMAGING    Ct Head Wo Contrast  Result Date: 12/18/2016 CLINICAL DATA:  Respiratory failure and status post intubation. EXAM: CT HEAD WITHOUT CONTRAST TECHNIQUE: Contiguous axial images were obtained from the base of the skull through the vertex without intravenous contrast. COMPARISON:  11/07/2012 FINDINGS: Brain: No acute hemorrhage identified. There is an old appearing lacunar infarct along the posterior aspect of the left caudate nucleus where it borders the internal capsule. No acute appearing cerebral infarct identified by CT. No evidence of mass effect, mass lesion, hydrocephalus or extra-axial fluid collection. Vascular: No hyperdense vessel or unexpected calcification. Skull: Normal. Negative for fracture or focal lesion. Sinuses/Orbits: No acute finding. Other: None. IMPRESSION: No acute findings. 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 Electronically Signed   By: Aletta Edouard M.D.   On: 12/18/2016 13:19   Dg Chest  Port 1 View  Result Date: 12/18/2016 CLINICAL DATA:  Central line placement. EXAM: PORTABLE CHEST 1 VIEW COMPARISON:  12/18/2016 and prior radiograph FINDINGS: A right IJ central venous catheter has been placed with tip overlying the superior  cavoatrial junction. There is no evidence of pneumothorax. Cardiomegaly, improved pulmonary edema, bilateral lower lung atelectasis/ airspace disease and probable effusions noted. An endotracheal tube with tip 2.3 cm above the carina and NG tube entering the stomach with tip off the field of view noted. IMPRESSION: Right IJ central venous catheter with tip overlying the superior cavoatrial junction. No evidence of pneumothorax. Cardiomegaly with improved pulmonary edema. Continued bilateral lower lung atelectasis/ airspace disease and probable effusions. Electronically Signed   By: Margarette Canada M.D.   On: 12/18/2016 23:20         ASSESSMENT/PLAN    61 years old lady with a past medical history significant for CHF, COPD, diabetes, tobacco abuse who presented to the hospital with respiratory failure.  Problem list  Acute hypoxemic respiratory failure GI bleed Elevated troponins-non-ST elevation MI Hypertensive emergency upon presentation Metabolic acidosis Lactic acidosis - improving CKD CHF COPD Diabetes mellitus Tobacco abuse-half a pack of cigarettes a day Noncompliance with medications   Remains critically ill  The patient is awake on the ventilator.  Vent requirements are minimal. SBT today followed by ABG. If the patient passes SBT and no procedures are planned by gastroenterology, will extubate the patient. Daily chest x-rays and ABGs while she is on the ventilator. Lung protective ventilation. DuoNeb every 6 hours   Her blood pressures have remained stable. Continue beta-blockers.  Given the patient's right pupil dilatation, a stat CT head of was performed that was negative for acute intracranial events.  It has remained  stable.  May have been a chronic problem. Monitor clinically.   Troponin elevation noted.?  Non-ST elevation MI versus demand ischemia.   Cardiology is on board. Unfortunately given her GI bleed, her aspirin and heparin drip were stopped this morning. Continue high-dose statins. Follow-up echocardiogram. The workup and treatment per cardiology's recommendations  1/4 blood cultures positive for strep.  Continue current antibiotics (vancomycin and cefepime ) till the culture speciate  Follow blood cultures. Follow sputum cultures  Start Protonix drip. Serial hemoglobin checks. Transfuse for hemoglobin less than 7 GI consult for GI bleed  DC aspirin and heparin drip as mentioned above.  Check and replace electrolytes  Check lactic acid levels  Monitor urine output.  Monitor daily creatinine.  SCD Protonix for stress ulcer prophylaxis Insulin for glycemic control    CC time 50 minutes    Nettie Elm, M.D.  Pulmonary & Critical Care Medicine

## 2016-12-19 NOTE — Progress Notes (Signed)
Resting Comfortably after extubation.  Arouses to voice.  Alert to self.  o2 sats > 92% on room air since extubation.

## 2016-12-20 ENCOUNTER — Encounter: Payer: Self-pay | Admitting: Gastroenterology

## 2016-12-20 DIAGNOSIS — I509 Heart failure, unspecified: Secondary | ICD-10-CM

## 2016-12-20 DIAGNOSIS — I214 Non-ST elevation (NSTEMI) myocardial infarction: Secondary | ICD-10-CM

## 2016-12-20 DIAGNOSIS — J9601 Acute respiratory failure with hypoxia: Secondary | ICD-10-CM

## 2016-12-20 DIAGNOSIS — K922 Gastrointestinal hemorrhage, unspecified: Secondary | ICD-10-CM

## 2016-12-20 LAB — CREATININE, SERUM
Creatinine, Ser: 2.42 mg/dL — ABNORMAL HIGH (ref 0.44–1.00)
GFR calc non Af Amer: 20 mL/min — ABNORMAL LOW (ref >60)
GFR, EST AFRICAN AMERICAN: 24 mL/min — AB (ref >60)

## 2016-12-20 LAB — CBC
HEMATOCRIT: 34 % — AB (ref 35.0–47.0)
Hemoglobin: 10.9 g/dL — ABNORMAL LOW (ref 12.0–16.0)
MCH: 27.1 pg (ref 26.0–34.0)
MCHC: 32.1 g/dL (ref 32.0–36.0)
MCV: 84.4 fL (ref 80.0–100.0)
Platelets: 99 10*3/uL — ABNORMAL LOW (ref 150–440)
RBC: 4.03 MIL/uL (ref 3.80–5.20)
RDW: 15.5 % — AB (ref 11.5–14.5)
WBC: 11.4 10*3/uL — AB (ref 3.6–11.0)

## 2016-12-20 LAB — HIV ANTIBODY (ROUTINE TESTING W REFLEX): HIV Screen 4th Generation wRfx: NONREACTIVE

## 2016-12-20 LAB — GLUCOSE, CAPILLARY
GLUCOSE-CAPILLARY: 179 mg/dL — AB (ref 65–99)
GLUCOSE-CAPILLARY: 155 mg/dL — AB (ref 65–99)
GLUCOSE-CAPILLARY: 180 mg/dL — AB (ref 65–99)
GLUCOSE-CAPILLARY: 166 mg/dL — AB (ref 65–99)
Glucose-Capillary: 166 mg/dL — ABNORMAL HIGH (ref 65–99)

## 2016-12-20 LAB — PROTIME-INR
INR: 1.18
Prothrombin Time: 14.9 seconds (ref 11.4–15.2)

## 2016-12-20 LAB — HEPARIN LEVEL (UNFRACTIONATED)
HEPARIN UNFRACTIONATED: 0.41 [IU]/mL (ref 0.30–0.70)
Heparin Unfractionated: 0.25 IU/mL — ABNORMAL LOW (ref 0.30–0.70)
Heparin Unfractionated: 0.24 IU/mL — ABNORMAL LOW (ref 0.30–0.70)

## 2016-12-20 LAB — APTT: aPTT: 76 seconds — ABNORMAL HIGH (ref 24–36)

## 2016-12-20 MED ORDER — INSULIN ASPART 100 UNIT/ML ~~LOC~~ SOLN
0.0000 [IU] | Freq: Every day | SUBCUTANEOUS | Status: DC
  Administered 2016-12-24 – 2016-12-25 (×2): 2 [IU] via SUBCUTANEOUS
  Filled 2016-12-20 (×2): qty 1

## 2016-12-20 MED ORDER — HEPARIN BOLUS VIA INFUSION
1400.0000 [IU] | Freq: Once | INTRAVENOUS | Status: AC
  Administered 2016-12-20: 1400 [IU] via INTRAVENOUS
  Filled 2016-12-20: qty 1400

## 2016-12-20 MED ORDER — FUROSEMIDE 10 MG/ML IJ SOLN
40.0000 mg | Freq: Once | INTRAMUSCULAR | Status: AC
  Administered 2016-12-20: 40 mg via INTRAVENOUS
  Filled 2016-12-20: qty 4

## 2016-12-20 MED ORDER — PREGABALIN 50 MG PO CAPS
100.0000 mg | ORAL_CAPSULE | Freq: Three times a day (TID) | ORAL | Status: DC
Start: 2016-12-20 — End: 2016-12-26
  Administered 2016-12-20 – 2016-12-26 (×18): 100 mg via ORAL
  Filled 2016-12-20 (×18): qty 2

## 2016-12-20 MED ORDER — INSULIN GLARGINE 100 UNIT/ML ~~LOC~~ SOLN
10.0000 [IU] | Freq: Every day | SUBCUTANEOUS | Status: DC
  Administered 2016-12-20 – 2016-12-24 (×5): 10 [IU] via SUBCUTANEOUS
  Filled 2016-12-20 (×5): qty 0.1

## 2016-12-20 MED ORDER — IPRATROPIUM-ALBUTEROL 0.5-2.5 (3) MG/3ML IN SOLN: 3.0000 mL | RESPIRATORY_TRACT | Status: DC | PRN

## 2016-12-20 MED ORDER — HEPARIN BOLUS VIA INFUSION
1350.0000 [IU] | Freq: Once | INTRAVENOUS | Status: AC
Start: 2016-12-20 — End: 2016-12-20
  Administered 2016-12-20: 1350 [IU] via INTRAVENOUS
  Filled 2016-12-20: qty 1350

## 2016-12-20 MED ORDER — PANTOPRAZOLE SODIUM 40 MG IV SOLR
40.0000 mg | Freq: Two times a day (BID) | INTRAVENOUS | Status: DC
  Administered 2016-12-20 – 2016-12-22 (×4): 40 mg via INTRAVENOUS
  Filled 2016-12-20 (×4): qty 40

## 2016-12-20 MED ORDER — INSULIN ASPART 100 UNIT/ML ~~LOC~~ SOLN
0.0000 [IU] | Freq: Three times a day (TID) | SUBCUTANEOUS | Status: DC
  Administered 2016-12-20 – 2016-12-21 (×3): 3 [IU] via SUBCUTANEOUS
  Administered 2016-12-21: 5 [IU] via SUBCUTANEOUS
  Administered 2016-12-21 – 2016-12-22 (×2): 2 [IU] via SUBCUTANEOUS
  Administered 2016-12-22 (×2): 3 [IU] via SUBCUTANEOUS
  Administered 2016-12-23: 8 [IU] via SUBCUTANEOUS
  Administered 2016-12-23: 2 [IU] via SUBCUTANEOUS
  Administered 2016-12-23: 3 [IU] via SUBCUTANEOUS
  Administered 2016-12-24: 5 [IU] via SUBCUTANEOUS
  Administered 2016-12-24: 8 [IU] via SUBCUTANEOUS
  Administered 2016-12-24: 5 [IU] via SUBCUTANEOUS
  Administered 2016-12-25: 3 [IU] via SUBCUTANEOUS
  Administered 2016-12-25: 8 [IU] via SUBCUTANEOUS
  Administered 2016-12-25: 3 [IU] via SUBCUTANEOUS
  Administered 2016-12-26: 5 [IU] via SUBCUTANEOUS
  Administered 2016-12-26: 3 [IU] via SUBCUTANEOUS
  Filled 2016-12-20 (×20): qty 1

## 2016-12-20 MED ORDER — PANTOPRAZOLE SODIUM 40 MG IV SOLR: 40.0000 mg | INTRAVENOUS | Status: DC

## 2016-12-20 NOTE — Plan of Care (Signed)
Pt continuing on heparin drip. Ambulating to [facility] ok.

## 2016-12-20 NOTE — Progress Notes (Addendum)
ANTICOAGULATION CONSULT NOTE - Initial Consult  Pharmacy Consult for heparin Indication: chest pain/ACS  Allergies  Allergen Reactions  . Latex Anaphylaxis and Rash  . Gabapentin Nausea And Vomiting    Patient Measurements: Height: 5' 9"  (175.3 cm) Weight: 209 lb 14.1 oz (95.2 kg) IBW/kg (Calculated) : 66.2 Heparin Dosing Weight: 90 kg  Vital Signs: Temp: 98.5 F (36.9 C) (12/04 0100) Temp Source: Oral (12/04 0100) BP: 127/72 (12/04 0330) Pulse Rate: 115 (12/04 0330)  Labs: Recent Labs    12/18/16 0849 12/18/16 1044 12/18/16 1402  12/18/16 1452  12/18/16 2125 12/18/16 2327 12/19/16 0435 12/19/16 1039 12/19/16 2052 12/20/16 0138  HGB 13.8 13.8 12.4  --   --   --   --  11.0* 11.5* 11.3* 10.9*  --   HCT 43.1 42.6 38.0  --   --   --   --  34.2* 34.8* 34.4* 33.2*  --   PLT 161 114* 131*  --   --   --   --  115* 125*  --   --   --   APTT  --   --   --   --  29  --   --   --   --   --   --  76*  LABPROT 12.9  --   --   --  13.8  --   --   --   --   --   --  14.9  INR 0.98  --   --   --  1.07  --   --   --   --   --   --  1.18  HEPARINUNFRC  --   --   --   --   --   --  0.45  --  0.45  --   --  0.25*  CREATININE 1.27* 1.28*  --   --   --   --   --  1.85* 1.90*  --   --   --   TROPONINI 0.04* 0.85*  --    < >  --    < >  --  22.11* 17.52* 18.21*  --   --    < > = values in this interval not displayed.    Estimated Creatinine Clearance: 37.7 mL/min (A) (by C-G formula based on SCr of 1.9 mg/dL (H)).   Medical History: Past Medical History:  Diagnosis Date  . CHF (congestive heart failure) ([place])   . COPD (chronic obstructive pulmonary disease) ([place])   . Diabetes mellitus without complication (HCC)     Medications:  Infusions:  . 0.9 % NaCl with KCl 40 mEq / L 125 mL/hr (12/20/16 0054)  . ceFEPime (MAXIPIME) IV Stopped (12/19/16 2123)  . dexmedetomidine (PRECEDEX) IV infusion Stopped (12/19/16 1715)  . heparin 1,100 Units/hr (12/19/16 2000)  . norepinephrine  (LEVOPHED) Adult infusion Stopped (12/19/16 1900)  . pantoprozole (PROTONIX) infusion 8 mg/hr (12/19/16 2228)  . propofol (DIPRIVAN) infusion Stopped (12/19/16 1631)  . sodium chloride    . vancomycin      Assessment: 61 yof cc respiratory distress, PMH CHF, COPD, DM, tobacco use. Troponin 0.04 - 0.85 -, ECG shows sinus tachycardia, LVH with secondary repolarization abnormality, and prolonged QT interval. Pharmacy consulted to dose UFH for ACS.   Goal of Therapy:  Heparin level 0.3-0.7 units/ml Monitor platelets by anticoagulation protocol: Yes   Plan:  Heparin stopped due to hematemesis. OK to resume heparin drip after EGD per GI. Will resume  heparin without bolus at 1100 units/hr and check a HL in 6 hours.   12/4 @ 0130 HL 0.25 subtherapeutic. Of note aPTT 76, patient does not appear to be on any PTA anticoagulants per care everywhere. Will rebolus w/ heparin 1400 units IV x 1 and increase rate to 1250 units/hr, will recheck HL @ 1030 and CBC w/ am labs.  Tobie Lords, PharmD, BCPS Clinical Pharmacist 12/20/2016

## 2016-12-20 NOTE — Care Management (Signed)
Found that [facility] is not providing oxygen nor are they providing any home health services. EGD performed 12/3 and extubated after procedure.

## 2016-12-20 NOTE — Progress Notes (Signed)
Patient moved to room 206 by wheelchair with this RN and Marshall Cork, Therapist, sports.  Patient alert with no distress noted.  2C nurse Janett Billow, RN at bedside along with patient's family member.

## 2016-12-20 NOTE — Progress Notes (Signed)
ANTICOAGULATION CONSULT NOTE - Initial Consult  Pharmacy Consult for heparin Indication: chest pain/ACS  Allergies  Allergen Reactions  . Latex Anaphylaxis and Rash  . Gabapentin Nausea And Vomiting    Patient Measurements: Height: 5' 9"  (175.3 cm) Weight: 209 lb 14.1 oz (95.2 kg) IBW/kg (Calculated) : 66.2 Heparin Dosing Weight: 90 kg  Vital Signs: Temp: 98.9 F (37.2 C) (12/04 0800) Temp Source: Oral (12/04 0800)  Labs: Recent Labs    12/18/16 0849  12/18/16 1452  12/18/16 2327 12/19/16 0435 12/19/16 1039 12/19/16 2052 12/20/16 0138 12/20/16 0417 12/20/16 1153 12/20/16 1808  HGB 13.8   < >  --   --  11.0* 11.5* 11.3* 10.9*  --  10.9*  --   --   HCT 43.1   < >  --   --  34.2* 34.8* 34.4* 33.2*  --  34.0*  --   --   PLT 161   < >  --   --  115* 125*  --   --   --  99*  --   --   APTT  --   --  29  --   --   --   --   --  76*  --   --   --   LABPROT 12.9  --  13.8  --   --   --   --   --  14.9  --   --   --   INR 0.98  --  1.07  --   --   --   --   --  1.18  --   --   --   HEPARINUNFRC  --   --   --    < >  --  0.45  --   --  0.25*  --  0.41 0.24*  CREATININE 1.27*   < >  --   --  1.85* 1.90*  --   --   --  2.42*  --   --   TROPONINI 0.04*   < >  --    < > 22.11* 17.52* 18.21*  --   --   --   --   --    < > = values in this interval not displayed.    Estimated Creatinine Clearance: 29.6 mL/min (A) (by C-G formula based on SCr of 2.42 mg/dL (H)).   Medical History: Past Medical History:  Diagnosis Date  . CHF (congestive heart failure) ([place])   . COPD (chronic obstructive pulmonary disease) ([place])   . Diabetes mellitus without complication (HCC)     Medications:  Infusions:  . heparin 1,250 Units/hr (12/20/16 1835)    Assessment: 61 yof cc respiratory distress, PMH CHF, COPD, DM, tobacco use. Troponin 0.04 - 0.85 -, ECG shows sinus tachycardia, LVH with secondary repolarization abnormality, and prolonged QT interval. Pharmacy consulted to dose UFH for ACS.    Goal of Therapy:  Heparin level 0.3-0.7 units/ml Monitor platelets by anticoagulation protocol: Yes   Plan:  Heparin stopped due to hematemesis. OK to resume heparin drip after EGD per GI. Will resume heparin without bolus at 1100 units/hr and check a HL in 6 hours.   12/4 @ 0130 HL 0.25 subtherapeutic. Of note aPTT 76, patient does not appear to be on any PTA anticoagulants per care everywhere. Will rebolus w/ heparin 1400 units IV x 1 and increase rate to 1250 units/hr, will recheck HL @ 1030 and CBC w/ am labs.  12/4: Heparin level  resulted @ 0.41. Will recheck Heparin level @ 1800  12/4 Heparin level low at 0.24. Confirmed with RN that drip had not been held. Bolus 1350 units and increase rate to 1400 units/hr. Recheck level in 6 hours  [name], Pharm.D, BCPS Clinical Pharmacist  12/20/2016

## 2016-12-20 NOTE — Progress Notes (Signed)
Report called to Janett Billow, RN on [facility].

## 2016-12-20 NOTE — Progress Notes (Signed)
[facility] [place] NAME: [name]    MR#:  [id number]  DATE OF BIRTH:  [birth year]  SUBJECTIVE:  CHIEF COMPLAINT:   Chief Complaint  Patient presents with  . Respiratory Distress     Came with respi distress, Intubated in ER. Have high troponin and was on heparine drip, but started to have Gi bleed. BP stable, EGD done- ulcer, but no active bleed found. off levophed and tapered sedation on SBP tolerated and extubated 12/19/16- much better, no complains.  REVIEW OF SYSTEMS:    Review of Systems  Constitutional: Negative for chills, fever and malaise/fatigue.  HENT: Negative for congestion, ear discharge, ear pain, hearing loss and sore throat.   Eyes: Negative for blurred vision and double vision.  Respiratory: Negative for cough, sputum production and shortness of breath.   Cardiovascular: Negative for chest pain, palpitations, orthopnea and leg swelling.  Gastrointestinal: Negative for abdominal pain, diarrhea, nausea and vomiting.  Genitourinary: Negative for dysuria, frequency and hematuria.  Musculoskeletal: Negative for falls, joint pain and myalgias.  Skin: Negative for rash.  Neurological: Negative for dizziness, tingling, sensory change, focal weakness and seizures.  Psychiatric/Behavioral: Negative for depression and substance abuse.    DRUG ALLERGIES:   Allergies  Allergen Reactions  . Latex Anaphylaxis and Rash  . Gabapentin Nausea And Vomiting    VITALS:  Blood pressure 128/81, pulse (!) 114, temperature 98.9 F (37.2 C), temperature source Oral, resp. rate 20, height 5' 9"  (1.753 m), weight 95.2 kg (209 lb 14.1 oz), SpO2 93 %.  PHYSICAL EXAMINATION:   GENERAL:  61 y.o.-year-old patient lying in the bed with no acute distress.  EYES: Pupils equal, round, reactive to light and accommodation. No scleral icterus. Extraocular muscles intact.  HEENT: Head atraumatic, normocephalic. Oropharynx and nasopharynx clear.   NECK:  Supple, no jugular venous distention. No thyroid enlargement, no tenderness.  LUNGS: Normal breath sounds bilaterally, no wheezing,  or crepitation. No use of accessory muscles of respiration.  CARDIOVASCULAR: S1, S2 normal. No murmurs, rubs, or gallops.  ABDOMEN: Soft, nontender, nondistended. Bowel sounds present. No organomegaly or mass.  EXTREMITIES: No pedal edema, cyanosis, or clubbing.  NEUROLOGIC: right pupil fix, left pupil responsive to light. Off sedation and follows  Commands.power 5/5 all limbs, gait not checked. PSYCHIATRIC: The patient is alert and oriented.[name] Kitchen  SKIN: No obvious rash, lesion, or ulcer.   Physical Exam LABORATORY PANEL:   CBC Recent Labs  Lab 12/20/16 0417  WBC 11.4*  HGB 10.9*  HCT 34.0*  PLT 99*   ------------------------------------------------------------------------------------------------------------------  Chemistries  Recent Labs  Lab 12/18/16 0849  12/18/16 2327 12/19/16 0435 12/20/16 0417  NA 134*   < > 138 136  --   K 2.6*   < > 4.1 4.1  --   CL 102   < > 112* 110  --   CO2 17*   < > 20* 17*  --   GLUCOSE 518*   < > 102* 179*  --   BUN 15   < > 19 19  --   CREATININE 1.27*   < > 1.85* 1.90* 2.42*  CALCIUM 8.3*   < > 7.7* 7.9*  --   MG  --   --  1.3*  --   --   AST 24  --   --   --   --   ALT 10*  --   --   --   --   ALKPHOS 106  --   --   --   --  BILITOT 0.4  --   --   --   --    < > = values in this interval not displayed.   ------------------------------------------------------------------------------------------------------------------  Cardiac Enzymes Recent Labs  Lab 12/19/16 0435 12/19/16 1039  TROPONINI 17.52* 18.21*   ------------------------------------------------------------------------------------------------------------------  RADIOLOGY:  Dg Chest Port 1 View  Result Date: 12/18/2016 CLINICAL DATA:  Central line placement. EXAM: PORTABLE CHEST 1 VIEW COMPARISON:  12/18/2016 and prior radiograph  FINDINGS: A right IJ central venous catheter has been placed with tip overlying the superior cavoatrial junction. There is no evidence of pneumothorax. Cardiomegaly, improved pulmonary edema, bilateral lower lung atelectasis/ airspace disease and probable effusions noted. An endotracheal tube with tip 2.3 cm above the carina and NG tube entering the stomach with tip off the field of view noted. IMPRESSION: Right IJ central venous catheter with tip overlying the superior cavoatrial junction. No evidence of pneumothorax. Cardiomegaly with improved pulmonary edema. Continued bilateral lower lung atelectasis/ airspace disease and probable effusions. Electronically Signed   By: Margarette Canada M.D.   On: 12/18/2016 23:20   US Abdomen Limited Ruq  Result Date: 12/19/2016 CLINICAL DATA:  Thrombocytopenia. EXAM: ULTRASOUND ABDOMEN LIMITED RIGHT UPPER QUADRANT COMPARISON:  CT scan of March 12, 2012. FINDINGS: Gallbladder: Status post cholecystectomy. Common bile duct: Diameter: 4 mm which is within normal limits. Liver: Increased echogenicity of hepatic parenchyma is noted with mildly nodular hepatic contours suggesting cirrhosis. No focal abnormality is noted. Portal vein is patent on color Doppler imaging with normal direction of blood flow towards the liver. IMPRESSION: Status post cholecystectomy. Increased echogenicity of hepatic parenchyma is noted with slightly nodular hepatic margins suggesting possible hepatic cirrhosis. Electronically Signed   By: Marijo Conception, M.D.   On: 12/19/2016 15:40    ASSESSMENT AND PLAN:   Principal Problem:   Acute respiratory failure with hypoxia (HCC) Active Problems:   PNA (pneumonia)   CHF exacerbation (HCC)   Acute respiratory failure (HCC)   Sepsis (HCC)   Coffee ground emesis   NSTEMI (non-ST elevated myocardial infarction) ([place])   * Ac respi failure   Ac Diastolic CHF exacerbation   Pneumonia and sepsis- suspected on admission, but ruled out.    ETT and  vent support, monitor in ICU   Vanc + cefepime,  Bl cx sent- one bottle have viridence strep species , positive MRSA screen. Likely contaminated blood cx.   IV fluid support, due to Hypotension   was hypotensive, needed levophed, off now.   Also given lasix.   * NSTEMI   Heparine drip started, but stopped due to GI bleed.    Echo confirmed EF 45%- dropped compared to last year.   Cardio on case, plan for cath- once renal func improves.  * GI bleed   EGD planned by GI.   Stopped heparine.   PPI and octreotide.   On EGD- found to have gastric ulcer, no active bleed- suggest to re-start on heparin due to acute MI.  * Ac on ch renal failure- stage 3   MOnitor for now. Avoid nephrotoxin meds   Once improve, plan is for Cardiac cath.  * DM with uncontrolled blood sugar.   Bl sugar was high with CO2 of 17 and K was < 3.   on Glucostabilizer drip by ICU.- now lantus resumed.  * COPD    Does not have active wheeze.    on duoneb.   All the records are reviewed and case discussed with Care Management/Social Workerr. Management plans discussed  with the patient, family and they are in agreement.  CODE STATUS: full.  TOTAL TIME TAKING CARE OF THIS PATIENT: 35 minutes.    POSSIBLE D/C IN 1-2 DAYS, DEPENDING ON CLINICAL CONDITION.   Vaughan Basta M.D on 12/20/2016   Between 7am to 6pm - Pager - [phone]  After 6pm go to [email or web address] - password [email or web address] [facility]  Office  [phone]  CC: Primary care physician; [facility]  Note: This dictation was prepared with Dragon dictation along with smaller phrase technology. Any transcriptional errors that result from this process are unintentional.

## 2016-12-20 NOTE — Progress Notes (Signed)
Inpatient Diabetes Program Recommendations  AACE/ADA: New Consensus Statement on Inpatient Glycemic Control (2015)  Target Ranges:  Prepandial:   less than 140 mg/dL      Peak postprandial:   less than 180 mg/dL (1-2 hours)      Critically ill patients:  140 - 180 mg/dL   Results for [name], [name] (MRN [id number]) as of 12/20/2016 13:08  Ref. Range 12/19/2016 07:54 12/19/2016 11:48 12/19/2016 15:52 12/19/2016 19:55 12/19/2016 23:47 12/20/2016 04:18 12/20/2016 07:33 12/20/2016 11:17  Glucose-Capillary Latest Ref Range: 65 - 99 mg/dL 144 (H) 211 (H) 175 (H) 215 (H) 79 166 (H) 179 (H) 155 (H)   Results for [name], [name] (MRN [id number]) as of 12/20/2016 13:08  Ref. Range 12/18/2016 08:49  Glucose Latest Ref Range: 65 - 99 mg/dL 518 (HH)   Review of Glycemic Control  Diabetes history: DM2 Outpatient Diabetes medications: None in over 1 year Current orders for Inpatient glycemic control: Lantus 10 units daily, Novolog 0-15 units TID with meals, Novolog 0-5 units QHS  Inpatient Diabetes Program Recommendations:  Insulin - Basal: Note Lantus 10 units ordered and given today. Outpatient Referral: Patient will need to be prescribed DM medication at time of discharge. Patient has not taken Humulin R U500 (concentrated insulin) in over 1 year becasue she "just stopped taking it".  Patient reports that she would be interested in being referred to a local Endocrinologist for follow up and assistance with DM control. Outpatient DM medication plan: If patient is discharged home back on insulin, would recommend Lantus and Novolog similar to being used as an inpatient.  NOTE: Spoke with patient about diabetes and home regimen for diabetes control. Patient reports that she last saw her PCP on 08/12/16 and she use to see an Endocrinologist in [place] but it has been a long time since she was seen. Patient is interested in being referred to a local Endocrinologist for follow up and assistnace with DM management.  Patient reports that the last DM medication that she was taking was Humulin R U500 (concentrated insulin) and she has not taken it in over 1 year. Inquired about why she was not taking insulin and patient reports that "I just stopped taking it."   Discussed initial glucose of 518 mg/dl on 12/18/16. Patient admits that stopping her insulin probably was not a good idea. Patient has not been checking her glucose at home either.  Discussed glucose and A1C goals. Discussed importance of checking CBGs and maintaining good CBG control to prevent long-term and short-term complications. Explained how hyperglycemia leads to damage within blood vessels which lead to the common complications seen with uncontrolled diabetes. Stressed to the patient the importance of improving glycemic control to prevent further complications from uncontrolled diabetes. Discussed impact of nutrition, exercise, stress, sickness, and medications on diabetes control. Patient apologized for not taking care of her DM and states that she plans to get back on track with DM control.  Provided emotional support to patient and encouraged patient to start checking glucose and taking medications as prescribed. Inquired about other DM medications patient has been on in the past and patient states that she use to take Metformin and Novolog insulin in the past. Based on current glucose trends as an inpatient, would not recommend patient resume U500 insulin. If patient is discharged home back on insulin, would recommend Lantus and Novolog similar to being used as an inpatient. Patient verbalized understanding of information discussed and she states that she has no further questions at this  time related to diabetes.  Thanks, Barnie Alderman, RN, MSN, CDE Diabetes Coordinator Inpatient Diabetes Program [phone] (Team Pager)

## 2016-12-20 NOTE — Progress Notes (Signed)
ANTICOAGULATION CONSULT NOTE - Initial Consult  Pharmacy Consult for heparin Indication: chest pain/ACS  Allergies  Allergen Reactions  . Latex Anaphylaxis and Rash  . Gabapentin Nausea And Vomiting    Patient Measurements: Height: 5' 9"  (175.3 cm) Weight: 209 lb 14.1 oz (95.2 kg) IBW/kg (Calculated) : 66.2 Heparin Dosing Weight: 90 kg  Vital Signs: Temp: 98.9 F (37.2 C) (12/04 0800) Temp Source: Oral (12/04 0800) BP: 128/81 (12/04 0730) Pulse Rate: 114 (12/04 0730)  Labs: Recent Labs    12/18/16 0849  12/18/16 1452  12/18/16 2327 12/19/16 0435 12/19/16 1039 12/19/16 2052 12/20/16 0138 12/20/16 0417 12/20/16 1153  HGB 13.8   < >  --   --  11.0* 11.5* 11.3* 10.9*  --  10.9*  --   HCT 43.1   < >  --   --  34.2* 34.8* 34.4* 33.2*  --  34.0*  --   PLT 161   < >  --   --  115* 125*  --   --   --  99*  --   APTT  --   --  29  --   --   --   --   --  76*  --   --   LABPROT 12.9  --  13.8  --   --   --   --   --  14.9  --   --   INR 0.98  --  1.07  --   --   --   --   --  1.18  --   --   HEPARINUNFRC  --   --   --    < >  --  0.45  --   --  0.25*  --  0.41  CREATININE 1.27*   < >  --   --  1.85* 1.90*  --   --   --  2.42*  --   TROPONINI 0.04*   < >  --    < > 22.11* 17.52* 18.21*  --   --   --   --    < > = values in this interval not displayed.    Estimated Creatinine Clearance: 29.6 mL/min (A) (by C-G formula based on SCr of 2.42 mg/dL (H)).   Medical History: Past Medical History:  Diagnosis Date  . CHF (congestive heart failure) ([place])   . COPD (chronic obstructive pulmonary disease) ([place])   . Diabetes mellitus without complication (HCC)     Medications:  Infusions:  . heparin 1,250 Units/hr (12/20/16 1330)    Assessment: 61 yof cc respiratory distress, PMH CHF, COPD, DM, tobacco use. Troponin 0.04 - 0.85 -, ECG shows sinus tachycardia, LVH with secondary repolarization abnormality, and prolonged QT interval. Pharmacy consulted to dose UFH for ACS.    Goal of Therapy:  Heparin level 0.3-0.7 units/ml Monitor platelets by anticoagulation protocol: Yes   Plan:  Heparin stopped due to hematemesis. OK to resume heparin drip after EGD per GI. Will resume heparin without bolus at 1100 units/hr and check a HL in 6 hours.   12/4 @ 0130 HL 0.25 subtherapeutic. Of note aPTT 76, patient does not appear to be on any PTA anticoagulants per care everywhere. Will rebolus w/ heparin 1400 units IV x 1 and increase rate to 1250 units/hr, will recheck HL @ 1030 and CBC w/ am labs.  12/4: Heparin level resulted @ 0.41. Will recheck Heparin level @ [place], PharmD  Clinical Pharmacist 12/20/2016

## 2016-12-20 NOTE — Progress Notes (Signed)
Nutrition Follow-up  DOCUMENTATION CODES:   Obesity unspecified  INTERVENTION:  Even though patient is reporting decreased appetite and intake now and PTA, she is refusing oral nutrition supplements and snacks.  Encouraged adequate intake at meals. Encouraged patient to choose a good protein source at each meal and to start by eating that first. Reviewed options on menu that have protein in them.  NUTRITION DIAGNOSIS:   Inadequate oral intake related to decreased appetite as evidenced by per patient/family report.  New nutrition diagnosis.  GOAL:   Patient will meet greater than or equal to 90% of their needs  Progressing.  MONITOR:   PO intake, Labs, Weight trends, I & O's  REASON FOR ASSESSMENT:   Ventilator    ASSESSMENT:   61 year old female with PMHx of COPD, CHF, DM type 2, hx hepatitis C s/p treatment, surgical hx of appendectomy and cholecystectomy who presented with respiratory failure and was intubated in the field also found to have GIB, NSTEMI, hypertensive emergency.  -Patient was extubated on 12/3 at 1800. -Passed bedside swallow evaluation and was advanced to heart healthy/carbohydrate modified diet.  Met with patient at bedside after she had transferred into room 206. She reports she is tolerating her diet well. Her appetite is not all the way back to normal yet, but she reports she has actually had a decreased appetite this past year and she is unsure why. Patient reports she does not want any oral nutrition supplements to help meet calorie/protein needs and also does not want snacks.   Patient was 236.2 lbs on 12/07/2015 and has lost 26.3 lbs (11.1% body weight) over the past year, which is not significant for time frame.  Patient does not meet criteria for malnutrition at this time.  Medications reviewed and include: Novolog 0-15 units TID, Novolog 0-5 units QHS, Lantus 10 units daily, pantoprazole, heparin infusion.  Labs reviewed: CBG 79-215,  Creatinine 2.42, eGFR 24.  Discussed with RN.  Diet Order:  Diet heart healthy/carb modified Room service appropriate? Yes; Fluid consistency: Thin  EDUCATION NEEDS:   Education needs have been addressed  Skin:  Skin Assessment: Reviewed RN Assessment  Last BM:  12/20/2016 - medium type 4  Height:   Ht Readings from Last 1 Encounters:  12/18/16 _0  (1.753 m)    Weight:   Wt Readings from Last 1 Encounters:  12/18/16 209 lb 14.1 oz (95.2 kg)    Ideal Body Weight:  65.9 kg  BMI:  Body mass index is 30.99 kg/m.  Estimated Nutritional Needs:   Kcal:  9324-1991 (MSJ x 1.2-1.3)  Protein:  95-115 grams (1-1.2 grams/kg)  Fluid:  2-2.3 L/day (30-35 mL/kg IBW)  Willey Blade, MS, RD, LDN Office: [phone] Pager: [phone] After Hours/Weekend Pager: [phone]

## 2016-12-20 NOTE — Progress Notes (Addendum)
Tolerating extubation No distress No new complaints Denies chest pain and dyspnea  Vitals:   12/20/16 0600 12/20/16 0638 12/20/16 0730 12/20/16 0800  BP: 126/74 (!) 139/92 128/81   Pulse: (!) 104 (!) 114 (!) 114   Resp: (!) 24 (!) 31 20   Temp:    98.9 F (37.2 C)  TempSrc:    Oral  SpO2: 96% 96% 93%   Weight:      Height:         Gen: NAD HEENT: NCAT, sclerae white Lungs: full BS, few bibasilar crackles Cardiovascular: Reg rate, no M noted Abdomen: Soft, NT, +BS Ext: no C/C/E Neuro: grossly intact   BMP Latest Ref Rng & Units 12/20/2016 12/19/2016 12/18/2016  Glucose 65 - 99 mg/dL - 179(H) 102(H)  BUN 6 - 20 mg/dL - 19 19  Creatinine 0.44 - 1.00 mg/dL 2.42(H) 1.90(H) 1.85(H)  Sodium 135 - 145 mmol/L - 136 138  Potassium 3.5 - 5.1 mmol/L - 4.1 4.1  Chloride 101 - 111 mmol/L - 110 112(H)  CO2 22 - 32 mmol/L - 17(L) 20(L)  Calcium 8.9 - 10.3 mg/dL - 7.9(L) 7.7(L)    CBC Latest Ref Rng & Units 12/20/2016 12/19/2016 12/19/2016  WBC 3.6 - 11.0 K/uL 11.4(H) - -  Hemoglobin 12.0 - 16.0 g/dL 10.9(L) 10.9(L) 11.3(L)  Hematocrit 35.0 - 47.0 % 34.0(L) 33.2(L) 34.4(L)  Platelets 150 - 440 K/uL 99(L) - -    No new CXR  IMPRESSION: -VDRF, resolved -Smoker -H/O COPD - not currently wheezing -Pulmonary edema - clinically improving -Severe hypertension, controlled -AKI/CKD -Lactic acidosis, resolved - doubt due to sepsis -UGIB - S/P EGD 12/03.  Non-bleeding gastric ulcers with an adherent clot ([place] Class IIb). Treated with a heater probe. -DM 2, presently controlled -Medical noncompliance  PLAN/REC: Transfer to [place] with cardiac monitoring Cardiology following and plans LHC Continue heparin (ok'd by GI) Furosemide x1 dose 12/04 Recheck CXR 12/05 DC CVL Change SSI to [place] antibiotics    Merton Border, MD PCCM service Mobile [phone] Pager [phone] 12/20/2016 12:00 PM

## 2016-12-20 NOTE — Progress Notes (Signed)
[facility]  SUBJECTIVE: no chest pain. States she had some pain before the event that brought her in but does not have great recall of the events.    Vitals:   12/20/16 0600 12/20/16 0638 12/20/16 0730 12/20/16 0800  BP: 126/74 (!) 139/92 128/81   Pulse: (!) 104 (!) 114 (!) 114   Resp: (!) 24 (!) 31 20   Temp:    98.9 F (37.2 C)  TempSrc:    Oral  SpO2: 96% 96% 93%   Weight:      Height:        Intake/Output Summary (Last 24 hours) at 12/20/2016 0825 Last data filed at 12/20/2016 0813 Gross per 24 hour  Intake 3943.18 ml  Output 1085 ml  Net 2858.18 ml    LABS: Basic Metabolic Panel: Recent Labs    12/18/16 2327 12/19/16 0435 12/20/16 0417  NA 138 136  --   K 4.1 4.1  --   CL 112* 110  --   CO2 20* 17*  --   GLUCOSE 102* 179*  --   BUN 19 19  --   CREATININE 1.85* 1.90* 2.42*  CALCIUM 7.7* 7.9*  --   MG 1.3*  --   --   PHOS 3.4  --   --    Liver Function Tests: Recent Labs    12/18/16 0849  AST 24  ALT 10*  ALKPHOS 106  BILITOT 0.4  PROT 7.6  ALBUMIN 2.9*   No results for input(s): LIPASE, AMYLASE in the last 72 hours. CBC: Recent Labs    12/18/16 0849  12/19/16 0435  12/19/16 2052 12/20/16 0417  WBC 12.2*   < > 13.9*  --   --  11.4*  NEUTROABS 7.3*  --   --   --   --   --   HGB 13.8   < > 11.5*   < > 10.9* 10.9*  HCT 43.1   < > 34.8*   < > 33.2* 34.0*  MCV 85.2   < > 83.3  --   --  84.4  PLT 161   < > 125*  --   --  99*   < > = values in this interval not displayed.   Cardiac Enzymes: Recent Labs    12/18/16 2327 12/19/16 0435 12/19/16 1039  TROPONINI 22.11* 17.52* 18.21*   BNP: Invalid input(s): POCBNP D-Dimer: No results for input(s): DDIMER in the last 72 hours. Hemoglobin A1C: No results for input(s): HGBA1C in the last 72 hours. Fasting Lipid Panel: Recent Labs    12/18/16 1617  TRIG 178*   Thyroid Function Tests: No results for input(s): TSH, T4TOTAL, T3FREE,  THYROIDAB in the last 72 hours.  Invalid input(s): FREET3 Anemia Panel: No results for input(s): VITAMINB12, FOLATE, FERRITIN, TIBC, IRON, RETICCTPCT in the last 72 hours.   Physical Exam: Blood pressure 128/81, pulse (!) 114, temperature 98.9 F (37.2 C), temperature source Oral, resp. rate 20, height 5' 9"  (1.753 m), weight 95.2 kg (209 lb 14.1 oz), SpO2 93 %.   Wt Readings from Last 1 Encounters:  12/18/16 95.2 kg (209 lb 14.1 oz)     General appearance: alert and cooperative Resp: clear to auscultation bilaterally Cardio: regular rate and rhythm GI: soft, non-tender; bowel sounds normal; no masses,  no organomegaly Extremities: extremities normal, atraumatic, no cyanosis or edema Neurologic: Grossly normal  TELEMETRY: Reviewed telemetry pt in nsr:  ASSESSMENT AND PLAN:  Principal  Problem:   Acute respiratory failure with hypoxia (HCC)-now has been extubated. Breathing better  Active Problems:   PNA (pneumonia)   CHF exacerbation (HCC)-iimproving but creatinine increaseing. WIll need to reduce diuresis.     Sepsis (HCC)-on abx    Coffee ground emesis-s/p egd. Small blood clot in gastric body. Superficial gastric ulcer heat cauterized. Placed back on heparin.     NSTEMI (non-ST elevated myocardial infarction) (HCC)-ruled iin for nstemi. Will need consideration for cardiac cath when renal function improves. Continue with heparin and asa, atorvastatin. WIll follow renal function and proceed with cath when she has improvement.     Teodoro Spray, MD, [facility] 12/20/2016 8:25 AM

## 2016-12-21 ENCOUNTER — Inpatient Hospital Stay: Payer: Medicaid Other

## 2016-12-21 LAB — BASIC METABOLIC PANEL
ANION GAP: 8 (ref 5–15)
BUN: 21 mg/dL — ABNORMAL HIGH (ref 6–20)
CALCIUM: 8.8 mg/dL — AB (ref 8.9–10.3)
CO2: 17 mmol/L — ABNORMAL LOW (ref 22–32)
Chloride: 112 mmol/L — ABNORMAL HIGH (ref 101–111)
Creatinine, Ser: 2.58 mg/dL — ABNORMAL HIGH (ref 0.44–1.00)
GFR, EST AFRICAN AMERICAN: 22 mL/min — AB (ref >60)
GFR, EST NON AFRICAN AMERICAN: 19 mL/min — AB (ref >60)
Glucose, Bld: 187 mg/dL — ABNORMAL HIGH (ref 65–99)
Potassium: 4.8 mmol/L (ref 3.5–5.1)
Sodium: 137 mmol/L (ref 135–145)

## 2016-12-21 LAB — CBC
HCT: 32.9 % — ABNORMAL LOW (ref 35.0–47.0)
Hemoglobin: 10.8 g/dL — ABNORMAL LOW (ref 12.0–16.0)
MCH: 27.4 pg (ref 26.0–34.0)
MCHC: 32.7 g/dL (ref 32.0–36.0)
MCV: 83.8 fL (ref 80.0–100.0)
PLATELETS: 102 10*3/uL — AB (ref 150–440)
RBC: 3.92 MIL/uL (ref 3.80–5.20)
RDW: 15 % — AB (ref 11.5–14.5)
WBC: 11 10*3/uL (ref 3.6–11.0)

## 2016-12-21 LAB — BLOOD CULTURE ID PANEL (REFLEXED)
Acinetobacter baumannii: NOT DETECTED
Candida albicans: NOT DETECTED
Candida glabrata: NOT DETECTED
Candida krusei: NOT DETECTED
Candida parapsilosis: NOT DETECTED
Candida tropicalis: NOT DETECTED
ENTEROCOCCUS SPECIES: NOT DETECTED
Enterobacter cloacae complex: NOT DETECTED
Enterobacteriaceae species: NOT DETECTED
Escherichia coli: NOT DETECTED
HAEMOPHILUS INFLUENZAE: NOT DETECTED
Klebsiella oxytoca: NOT DETECTED
Klebsiella pneumoniae: NOT DETECTED
LISTERIA MONOCYTOGENES: NOT DETECTED
Neisseria meningitidis: NOT DETECTED
PSEUDOMONAS AERUGINOSA: NOT DETECTED
Proteus species: NOT DETECTED
SERRATIA MARCESCENS: NOT DETECTED
STAPHYLOCOCCUS AUREUS BCID: NOT DETECTED
STAPHYLOCOCCUS SPECIES: NOT DETECTED
STREPTOCOCCUS PNEUMONIAE: NOT DETECTED
STREPTOCOCCUS PYOGENES: NOT DETECTED
STREPTOCOCCUS SPECIES: NOT DETECTED
Streptococcus agalactiae: NOT DETECTED

## 2016-12-21 LAB — CULTURE, BLOOD (ROUTINE X 2): SPECIAL REQUESTS: ADEQUATE

## 2016-12-21 LAB — GLUCOSE, CAPILLARY
GLUCOSE-CAPILLARY: 162 mg/dL — AB (ref 65–99)
GLUCOSE-CAPILLARY: 122 mg/dL — AB (ref 65–99)
Glucose-Capillary: 203 mg/dL — ABNORMAL HIGH (ref 65–99)
Glucose-Capillary: 165 mg/dL — ABNORMAL HIGH (ref 65–99)

## 2016-12-21 LAB — CULTURE, RESPIRATORY: CULTURE: NO GROWTH

## 2016-12-21 LAB — HEPARIN LEVEL (UNFRACTIONATED)
HEPARIN UNFRACTIONATED: 0.37 [IU]/mL (ref 0.30–0.70)
Heparin Unfractionated: 0.5 IU/mL (ref 0.30–0.70)

## 2016-12-21 LAB — CULTURE, RESPIRATORY W GRAM STAIN

## 2016-12-21 MED ORDER — INFLUENZA VAC SPLIT QUAD 0.5 ML IM SUSY
0.5000 mL | PREFILLED_SYRINGE | INTRAMUSCULAR | Status: AC
  Administered 2016-12-22: 0.5 mL via INTRAMUSCULAR
  Filled 2016-12-21: qty 0.5

## 2016-12-21 MED ORDER — SODIUM CHLORIDE 0.9 % IV SOLN
INTRAVENOUS | Status: DC
  Administered 2016-12-21: 09:00:00 via INTRAVENOUS

## 2016-12-21 NOTE — Progress Notes (Signed)
ANTICOAGULATION CONSULT NOTE - Initial Consult  Pharmacy Consult for heparin Indication: chest pain/ACS  Allergies  Allergen Reactions  . Latex Anaphylaxis and Rash  . Gabapentin Nausea And Vomiting    Patient Measurements: Height: 5' 9"  (175.3 cm) Weight: 209 lb 14.1 oz (95.2 kg) IBW/kg (Calculated) : 66.2 Heparin Dosing Weight: 90 kg  Vital Signs: Temp: 98.5 F (36.9 C) (12/04 2050) Temp Source: Oral (12/04 2050) BP: 141/75 (12/04 2050) Pulse Rate: 104 (12/04 2050)  Labs: Recent Labs    12/18/16 0849  12/18/16 1452  12/18/16 2327 12/19/16 0435 12/19/16 1039 12/19/16 2052 12/20/16 0138 12/20/16 0417 12/20/16 1153 12/20/16 1808 12/21/16 0217  HGB 13.8   < >  --   --  11.0* 11.5* 11.3* 10.9*  --  10.9*  --   --  10.8*  HCT 43.1   < >  --   --  34.2* 34.8* 34.4* 33.2*  --  34.0*  --   --  32.9*  PLT 161   < >  --   --  115* 125*  --   --   --  99*  --   --  102*  APTT  --   --  29  --   --   --   --   --  76*  --   --   --   --   LABPROT 12.9  --  13.8  --   --   --   --   --  14.9  --   --   --   --   INR 0.98  --  1.07  --   --   --   --   --  1.18  --   --   --   --   HEPARINUNFRC  --   --   --    < >  --  0.45  --   --  0.25*  --  0.41 0.24* 0.50  CREATININE 1.27*   < >  --   --  1.85* 1.90*  --   --   --  2.42*  --   --  2.58*  TROPONINI 0.04*   < >  --    < > 22.11* 17.52* 18.21*  --   --   --   --   --   --    < > = values in this interval not displayed.    Estimated Creatinine Clearance: 27.8 mL/min (A) (by C-G formula based on SCr of 2.58 mg/dL (H)).   Medical History: Past Medical History:  Diagnosis Date  . CHF (congestive heart failure) ([place])   . COPD (chronic obstructive pulmonary disease) ([place])   . Diabetes mellitus without complication (HCC)     Medications:  Infusions:  . heparin 1,400 Units/hr (12/20/16 2008)    Assessment: 68 yof cc respiratory distress, PMH CHF, COPD, DM, tobacco use. Troponin 0.04 - 0.85 -, ECG shows sinus  tachycardia, LVH with secondary repolarization abnormality, and prolonged QT interval. Pharmacy consulted to dose UFH for ACS.   Goal of Therapy:  Heparin level 0.3-0.7 units/ml Monitor platelets by anticoagulation protocol: Yes   Plan:  Heparin stopped due to hematemesis. OK to resume heparin drip after EGD per GI. Will resume heparin without bolus at 1100 units/hr and check a HL in 6 hours.   12/4 @ 0130 HL 0.25 subtherapeutic. Of note aPTT 76, patient does not appear to be on any PTA anticoagulants per care everywhere. Will  rebolus w/ heparin 1400 units IV x 1 and increase rate to 1250 units/hr, will recheck HL @ 1030 and CBC w/ am labs.  12/4: Heparin level resulted @ 0.41. Will recheck Heparin level @ 1800  12/4 Heparin level low at 0.24. Confirmed with RN that drip had not been held. Bolus 1350 units and increase rate to 1400 units/hr. Recheck level in 6 hours  12/5 @ 0200 HL 0.50 therapeutic. Will continue current rate and will recheck HL @ 0800.  Tobie Lords, PharmD, BCPS Clinical Pharmacist 12/21/2016

## 2016-12-21 NOTE — Plan of Care (Signed)
Patient continues to progress.  Many friends and family visited tonight, patient quiet winded.  Several reminders about what patient could do, rather than focus on what she couldn't.

## 2016-12-21 NOTE — Progress Notes (Signed)
* [facility]      IMPRESSION: -Smoker -H/O COPD - not currently wheezing -Pulmonary edema - clinically improving -Severe hypertension, controlled -AKI/CKD OSA  PLAN/REC: Pt is no longer using CPAP.  Will start CPAP while here in the hospital to help her get used to it. We will have to see her outpatient to see if we can get her reinitiated on therapy for sleep apnea.     Date: 12/21/2016  MRN# [id number] [name] Jul 18, 1955   [name] is a 61 y.o. old female seen in follow up for chief complaint of  Chief Complaint  Patient presents with  . Respiratory Distress     HPI:  Patient has no particular complaints today, she tells me she is feeling well and her breathing is much better.  She is currently off of oxygen.   Medication:    Current Facility-Administered Medications:  .  0.9 %  sodium chloride infusion, , Intravenous, Continuous, Fath, Javier Docker, MD, Last Rate: 10 mL/hr at 12/21/16 0855 .  aspirin tablet 325 mg, 325 mg, Oral, Daily, Tukov, Magadalene S, NP, 325 mg at 12/20/16 1109 .  atorvastatin (LIPITOR) tablet 80 mg, 80 mg, Per Tube, q1800, Nettie Elm, MD, 80 mg at 12/20/16 1722 .  Chlorhexidine Gluconate Cloth 2 % PADS 6 each, 6 each, Topical, Q0600, Nettie Elm, MD, 6 each at 12/20/16 0550 .  docusate sodium (COLACE) capsule 100 mg, 100 mg, Oral, BID PRN, Vaughan Basta, MD .  heparin ADULT infusion 100 units/mL (25000 units/259m sodium chloride 0.45%), 1,400 Units/hr, Intravenous, Continuous, MRamond Dial RPH, Last Rate: 14 mL/hr at 12/21/16 0815, 1,400 Units/hr at 12/21/16 0815 .  insulin aspart (novoLOG) injection 0-15 Units, 0-15 Units, Subcutaneous, TID WC, SWilhelmina Mcardle MD, 3 Units at 12/21/16 0813 .  insulin aspart (novoLOG) injection 0-5 Units, 0-5 Units, Subcutaneous, QHS, Simonds, David B, MD .  insulin glargine (LANTUS) injection 10 Units, 10 Units, Subcutaneous, Daily, SWilhelmina Mcardle  MD, 10 Units at 12/20/16 1117 .  ipratropium-albuterol (DUONEB) 0.5-2.5 (3) MG/3ML nebulizer solution 3 mL, 3 mL, Nebulization, Q4H PRN, SWilhelmina Mcardle MD .  mupirocin ointment (BACTROBAN) 2 % 1 application, 1 application, Nasal, BID, KNettie Elm MD, 1 application at 159/74/161113 .  pantoprazole (PROTONIX) injection 40 mg, 40 mg, Intravenous, Q12H, SWilhelmina Mcardle MD, 40 mg at 12/20/16 1105 .  pneumococcal 23 valent vaccine (PNU-IMMUNE) injection 0.5 mL, 0.5 mL, Intramuscular, Tomorrow-1000, VVaughan Basta MD .  pregabalin (LYRICA) capsule 100 mg, 100 mg, Oral, TID, SWilhelmina Mcardle MD, 100 mg at 12/20/16 1649   Allergies:  Latex and Gabapentin  Review of Systems: Gen:  Denies  fever, sweats. HEENT: Denies blurred vision. Cvc:  No dizziness, chest pain or heaviness Resp:   Denies cough or sputum porduction. Gi: Denies swallowing difficulty, stomach pain. constipation, bowel incontinence Gu:  Denies bladder incontinence, burning urine Ext:   No Joint pain, stiffness. Skin: No skin rash, easy bruising. Endoc:  No polyuria, polydipsia. Psych: No depression, insomnia. Other:  All other systems were reviewed and found to be negative other than what is mentioned in the HPI.   Physical Examination:   VS: BP 134/75 (BP Location: Right Arm)   Pulse (!) 103   Temp 98 F (36.7 C) (Oral)   Resp (!) 22   Ht 5' 9"  (1.753 m)   Wt 209 lb 14.1 oz (95.2 kg)   SpO2 94%   BMI 30.99 kg/m   General  Appearance: No distress  Neuro:without focal findings,  speech normal,  HEENT: PERRLA, EOM intact. Pulmonary: normal breath sounds, No wheezing.   CardiovascularNormal S1,S2.  No m/r/g.   Abdomen: Benign, Soft, non-tender. Renal:  No costovertebral tenderness  GU:  Not performed at this time. Endoc: No evident thyromegaly, no signs of acromegaly. Skin:   warm, no rash. Extremities: normal, no cyanosis, clubbing.   LABORATORY PANEL:   CBC Recent Labs  Lab 12/21/16 0217    WBC 11.0  HGB 10.8*  HCT 32.9*  PLT 102*   ------------------------------------------------------------------------------------------------------------------  Chemistries  Recent Labs  Lab 12/18/16 0849  12/18/16 2327  12/21/16 0217  NA 134*   < > 138   < > 137  K 2.6*   < > 4.1   < > 4.8  CL 102   < > 112*   < > 112*  CO2 17*   < > 20*   < > 17*  GLUCOSE 518*   < > 102*   < > 187*  BUN 15   < > 19   < > 21*  CREATININE 1.27*   < > 1.85*   < > 2.58*  CALCIUM 8.3*   < > 7.7*   < > 8.8*  MG  --   --  1.3*  --   --   AST 24  --   --   --   --   ALT 10*  --   --   --   --   ALKPHOS 106  --   --   --   --   BILITOT 0.4  --   --   --   --    < > = values in this interval not displayed.   ------------------------------------------------------------------------------------------------------------------  Cardiac Enzymes Recent Labs  Lab 12/19/16 1039  TROPONINI 18.21*   ------------------------------------------------------------  RADIOLOGY:   No results found for this or any previous visit. Results for orders placed during the hospital encounter of 10/28/15  DG Chest 2 View   Narrative CLINICAL DATA:  Difficulty breathing.  EXAM: CHEST  2 VIEW  COMPARISON:  08/29/2015.  FINDINGS: Trachea is midline. Heart size normal. There maybe airspace disease in the left lower lobe. Lungs are otherwise clear. No pleural fluid. Flowing anterior osteophytosis in the thoracic spine.  IMPRESSION: Possible left lower lobe airspace opacification. Pneumonia cannot be excluded. Consider follow-up PA and lateral views of the chest in 3-4 weeks to ensure resolution, as clinically indicated, as malignancy cannot be definitively excluded.   Electronically Signed   By: Lorin Picket M.D.   On: 10/28/2015 14:40    ------------------------------------------------------------------------------------------------------------------  Thank  you for allowing [facility] to assist in the care of your patient. Our recommendations are noted above.  Please contact us if we can be of further service.   Marda Stalker, MD.  [facility] Office Number: [phone]  Patricia Pesa, M.D.  Merton Border, M.D  12/21/2016

## 2016-12-21 NOTE — Progress Notes (Signed)
ANTICOAGULATION CONSULT NOTE - Follow Up Consult  Pharmacy Consult for heparin Indication: chest pain/ACS  Allergies  Allergen Reactions  . Latex Anaphylaxis and Rash  . Gabapentin Nausea And Vomiting    Patient Measurements: Height: 5' 9"  (175.3 cm) Weight: 209 lb 14.1 oz (95.2 kg) IBW/kg (Calculated) : 66.2 Heparin Dosing Weight: 90 kg  Vital Signs: Temp: 98 F (36.7 C) (12/05 0454) Temp Source: Oral (12/05 0454) BP: 134/75 (12/05 0454) Pulse Rate: 103 (12/05 0454)  Labs: Recent Labs    12/18/16 1452  12/18/16 2327 12/19/16 0435 12/19/16 1039 12/19/16 2052 12/20/16 0138 12/20/16 0417  12/20/16 1808 12/21/16 0217 12/21/16 0755  HGB  --   --  11.0* 11.5* 11.3* 10.9*  --  10.9*  --   --  10.8*  --   HCT  --   --  34.2* 34.8* 34.4* 33.2*  --  34.0*  --   --  32.9*  --   PLT  --   --  115* 125*  --   --   --  99*  --   --  102*  --   APTT 29  --   --   --   --   --  76*  --   --   --   --   --   LABPROT 13.8  --   --   --   --   --  14.9  --   --   --   --   --   INR 1.07  --   --   --   --   --  1.18  --   --   --   --   --   HEPARINUNFRC  --    < >  --  0.45  --   --  0.25*  --    < > 0.24* 0.50 0.37  CREATININE  --   --  1.85* 1.90*  --   --   --  2.42*  --   --  2.58*  --   TROPONINI  --    < > 22.11* 17.52* 18.21*  --   --   --   --   --   --   --    < > = values in this interval not displayed.    Estimated Creatinine Clearance: 27.8 mL/min (A) (by C-G formula based on SCr of 2.58 mg/dL (H)).   Medical History: Past Medical History:  Diagnosis Date  . CHF (congestive heart failure) ([place])   . COPD (chronic obstructive pulmonary disease) ([place])   . Diabetes mellitus without complication ([place])     Medications:  Infusions:  . sodium chloride 10 mL/hr at 12/21/16 0855  . heparin 1,400 Units/hr (12/21/16 0815)    Assessment: 75 yof cc respiratory distress, PMH CHF, COPD, DM, tobacco use. Troponin 0.04 - 0.85 -, ECG shows sinus tachycardia, LVH with  secondary repolarization abnormality, and prolonged QT interval. Pharmacy consulted to dose UFH for ACS.   Goal of Therapy:  Heparin level 0.3-0.7 units/ml Monitor platelets by anticoagulation protocol: Yes   Plan:  Heparin stopped due to hematemesis. OK to resume heparin drip after EGD per GI. Will resume heparin without bolus at 1100 units/hr and check a HL in 6 hours.   12/4 @ 0130 HL 0.25 subtherapeutic. Of note aPTT 76, patient does not appear to be on any PTA anticoagulants per care everywhere. Will rebolus w/ heparin 1400 units IV x 1 and  increase rate to 1250 units/hr, will recheck HL @ 1030 and CBC w/ am labs.  12/4: Heparin level resulted @ 0.41. Will recheck Heparin level @ 1800  12/4 Heparin level low at 0.24. Confirmed with RN that drip had not been held. Bolus 1350 units and increase rate to 1400 units/hr. Recheck level in 6 hours  12/5 @ 0200 HL 0.50 therapeutic. Will continue current rate and will recheck HL @ 0800.  12/5 @ 0755 HL resulted @ 0.37 therapeutic. Will continue current rate and will recheck HL in am.   Larene Beach, PharmD  Clinical Pharmacist 12/21/2016

## 2016-12-21 NOTE — Consult Note (Signed)
Date: 12/21/2016                  Patient Name:  [name]  MRN: [id number]  DOB: September 21, 1955  Age / Sex: 61 y.o., female         PCP: [facility]                 Service Requesting Consult: Hospitalist/ Dustin Flock, MD                 Reason for Consult: ARF            History of Present Illness: Patient is a 61 y.o. female with medical problems of poorly controlled diabetes, hepatic cirrhosis, who was admitted to [facility] on 12/18/2016 for evaluation of acute respiratory distress.   Patient presented to the emergency room on December 2 with acute respiratory distress.  She was found by EMS in a hotel with decreased oxygen saturation.  She was placed on BiPAP then subsequently intubated for respiratory distress.  Upon initial presentation, patient had lactic acidosis, severe hypertension, then subsequently hypotension after Nitropaste was placed.  She had elevated troponins and is being evaluated by cardiology team.  Patient was then discovered to have dark maroon aspirated in the OG tube.  She underwent upper endoscopy with blood clot was found in the gastric body she also had  nonbleeding gastric ulcers with adherent clots.  Nephrology consult requested for acute renal failure.  Patient has a baseline creatinine of 1.28, GFR 44 Since admission, her creatinine has been increasing.  Today's creatinine is 2.6.  The rate of rise of creatinine has slowed down.  Urine output 1085 cc last 24 hours.  She has proteinuria.  Urinalysis shows protein of 100 mg/dL.  It also shows significant glucosuria  Patient has underlying poorly controlled diabetes.  Hemoglobin A1c was 10.8% in November 2017 Patient underwent upper endoscopy on December 3 2D echo from December 3 show LVEF of 40-45%, concentric left ventricular hypertrophy, grade 2 diastolic dysfunction mild mitral stenosis Right upper quadrant ultrasound shows increased echogenicity of hepatic parenchyma with nodular  hepatic margin suggesting possible hepatic cirrhosis.   Medications: Outpatient medications: Medications Prior to Admission  Medication Sig Dispense Refill Last Dose  . pregabalin (LYRICA) 100 MG capsule Take 100 mg by mouth 3 (three) times daily.   unknown at unknown    Current medications: Current Facility-Administered Medications  Medication Dose Route Frequency Provider Last Rate Last Dose  . 0.9 %  sodium chloride infusion   Intravenous Continuous Teodoro Spray, MD 10 mL/hr at 12/21/16 0855    . aspirin tablet 325 mg  325 mg Oral Daily Dorene Sorrow S, NP   325 mg at 12/21/16 1016  . atorvastatin (LIPITOR) tablet 80 mg  80 mg Per Tube q1800 Nettie Elm, MD   80 mg at 12/20/16 1722  . Chlorhexidine Gluconate Cloth 2 % PADS 6 each  6 each Topical Q0600 Nettie Elm, MD   6 each at 12/20/16 0550  . docusate sodium (COLACE) capsule 100 mg  100 mg Oral BID PRN Vaughan Basta, MD      . heparin ADULT infusion 100 units/mL (25000 units/280m sodium chloride 0.45%)  1,400 Units/hr Intravenous Continuous MRamond Dial RPH 14 mL/hr at 12/21/16 0815 1,400 Units/hr at 12/21/16 0815  . insulin aspart (novoLOG) injection 0-15 Units  0-15 Units Subcutaneous TID WC SWilhelmina Mcardle MD   3 Units at 12/21/16 0813  . insulin aspart (  novoLOG) injection 0-5 Units  0-5 Units Subcutaneous QHS Wilhelmina Mcardle, MD      . insulin glargine (LANTUS) injection 10 Units  10 Units Subcutaneous Daily Wilhelmina Mcardle, MD   10 Units at 12/21/16 1016  . ipratropium-albuterol (DUONEB) 0.5-2.5 (3) MG/3ML nebulizer solution 3 mL  3 mL Nebulization Q4H PRN Wilhelmina Mcardle, MD      . mupirocin ointment (BACTROBAN) 2 % 1 application  1 application Nasal BID Nettie Elm, MD   1 application at 11/11/83 1017  . pantoprazole (PROTONIX) injection 40 mg  40 mg Intravenous Q12H Wilhelmina Mcardle, MD   40 mg at 12/21/16 1022  . pregabalin (LYRICA) capsule 100 mg  100 mg Oral TID Wilhelmina Mcardle, MD   100 mg at  12/21/16 1015      Allergies: Allergies  Allergen Reactions  . Latex Anaphylaxis and Rash  . Gabapentin Nausea And Vomiting      Past Medical History: Past Medical History:  Diagnosis Date  . CHF (congestive heart failure) ([place])   . COPD (chronic obstructive pulmonary disease) ([place])   . Diabetes mellitus without complication [facility])      Past Surgical History: Past Surgical History:  Procedure Laterality Date  . APPENDECTOMY    . CHOLECYSTECTOMY    . ESOPHAGOGASTRODUODENOSCOPY N/A 12/19/2016   Procedure: ESOPHAGOGASTRODUODENOSCOPY (EGD);  Surgeon: Lin Landsman, MD;  Location: [facility];  Service: Gastroenterology;  Laterality: N/A;     Family History: Family History  Problem Relation Age of Onset  . Hypertension Mother      Social History: Social History   Socioeconomic History  . Marital status: Married    Spouse name: Not on file  . Number of children: Not on file  . Years of education: Not on file  . Highest education level: Not on file  Social Needs  . Financial resource strain: Not on file  . Food insecurity - worry: Not on file  . Food insecurity - inability: Not on file  . Transportation needs - medical: Not on file  . Transportation needs - non-medical: Not on file  Occupational History  . Not on file  Tobacco Use  . Smoking status: Current Every Day Smoker    Packs/day: 0.25    Types: Cigarettes  . Smokeless tobacco: Never Used  Substance and Sexual Activity  . Alcohol use: No  . Drug use: No  . Sexual activity: Not on file  Other Topics Concern  . Not on file  Social History Narrative  . Not on file     Review of Systems: Gen: Denies any fevers or chills or weight changes HEENT: Denies acute vision or hearing problems CV: No chest pain or shortness of breath at present Resp: No cough or sputum production GI: Appetite is good GU : No problems with voiding.  No blood in the urine MS: Denies any complaints Derm:  No  complaints Psych: No complaints Heme: No complaints Neuro: No complaints Endocrine poorly controlled diabetes.  Vital Signs: Blood pressure 134/75, pulse (!) 103, temperature 98 F (36.7 C), temperature source Oral, resp. rate (!) 22, height 5' 9"  (1.753 m), weight 95.2 kg (209 lb 14.1 oz), SpO2 94 %.   Intake/Output Summary (Last 24 hours) at 12/21/2016 1131 Last data filed at 12/21/2016 0807 Gross per 24 hour  Intake 1931 ml  Output 0 ml  Net 1931 ml    Weight trends: Filed Weights   12/18/16 0845 12/18/16 0908 12/18/16 1141  Weight:  107.1 kg (236 lb 1.8 oz) 107 kg (236 lb) 95.2 kg (209 lb 14.1 oz)    Physical Exam: General:  No acute distress, sitting up in the bed  HEENT  anicteric, moist oral mucous membranes  Neck:  Supple  Lungs:  Normal breathing effort,  mild scattered rhonchi  Heart::  Regular, soft systolic murmur  Abdomen:  Soft, nontender, nondistended  Extremities:  Trace to 1+ peripheral edema  Neurologic:  Alert, oriented  Skin:  No acute rashes             Lab results: Basic Metabolic Panel: Recent Labs  Lab 12/18/16 2327 12/19/16 0435 12/20/16 0417 12/21/16 0217  NA 138 136  --  137  K 4.1 4.1  --  4.8  CL 112* 110  --  112*  CO2 20* 17*  --  17*  GLUCOSE 102* 179*  --  187*  BUN 19 19  --  21*  CREATININE 1.85* 1.90* 2.42* 2.58*  CALCIUM 7.7* 7.9*  --  8.8*  MG 1.3*  --   --   --   PHOS 3.4  --   --   --     Liver Function Tests: Recent Labs  Lab 12/18/16 0849  AST 24  ALT 10*  ALKPHOS 106  BILITOT 0.4  PROT 7.6  ALBUMIN 2.9*   No results for input(s): LIPASE, AMYLASE in the last 168 hours. No results for input(s): AMMONIA in the last 168 hours.  CBC: Recent Labs  Lab 12/18/16 0849  12/20/16 0417 12/21/16 0217  WBC 12.2*   < > 11.4* 11.0  NEUTROABS 7.3*  --   --   --   HGB 13.8   < > 10.9* 10.8*  HCT 43.1   < > 34.0* 32.9*  MCV 85.2   < > 84.4 83.8  PLT 161   < > 99* 102*   < > = values in this interval not  displayed.    Cardiac Enzymes: Recent Labs  Lab 12/19/16 1039  TROPONINI 18.21*    BNP: Invalid input(s): POCBNP  CBG: Recent Labs  Lab 12/20/16 0733 12/20/16 1117 12/20/16 1649 12/20/16 2202 12/21/16 0752  GLUCAP 179* 155* 180* 166* 162*    Microbiology: Recent Results (from the past 720 hour(s))  Culture, blood (routine x 2)     Status: Abnormal   Collection Time: 12/18/16  8:49 AM  Result Value Ref Range Status   Specimen Description BLOOD RIGHT ANTECUBITAL  Final   Special Requests   Final    BOTTLES DRAWN AEROBIC AND ANAEROBIC Blood Culture adequate volume   Culture  Setup Time   Final    GRAM POSITIVE COCCI ANAEROBIC BOTTLE ONLY CRITICAL RESULT CALLED TO, READ BACK BY AND VERIFIED WITH: [place] [facility] [place] ON 12/19/16 [place].    Culture (A)  Final    VIRIDANS STREPTOCOCCUS THE SIGNIFICANCE OF ISOLATING THIS ORGANISM FROM A SINGLE SET OF BLOOD CULTURES WHEN MULTIPLE SETS ARE DRAWN IS UNCERTAIN. PLEASE NOTIFY THE MICROBIOLOGY DEPARTMENT WITHIN ONE WEEK IF SPECIATION AND SENSITIVITIES ARE REQUIRED. Performed at [facility], [place] [address], [place], [place] [address]    Report Status 12/21/2016 FINAL  Final  Culture, blood (routine x 2)     Status: None (Preliminary result)   Collection Time: 12/18/16  8:49 AM  Result Value Ref Range Status   Specimen Description BLOOD LEFT ANTECUBITAL  Final   Special Requests   Final    BOTTLES DRAWN AEROBIC AND ANAEROBIC Blood  Culture adequate volume   Culture NO GROWTH 3 DAYS  Final   Report Status PENDING  Incomplete  Blood Culture ID Panel (Reflexed)     Status: Abnormal   Collection Time: 12/18/16  8:49 AM  Result Value Ref Range Status   Enterococcus species NOT DETECTED NOT DETECTED Final   Listeria monocytogenes NOT DETECTED NOT DETECTED Final   Staphylococcus species NOT DETECTED NOT DETECTED Final   Staphylococcus aureus NOT DETECTED NOT DETECTED Final   Streptococcus species DETECTED (A) NOT DETECTED Final     Comment: Not Enterococcus species, Streptococcus agalactiae, Streptococcus pyogenes, or Streptococcus pneumoniae. CRITICAL RESULT CALLED TO, READ BACK BY AND VERIFIED WITH: MATT MCBANE AT 0701 ON 12/19/16 [place].    Streptococcus agalactiae NOT DETECTED NOT DETECTED Final   Streptococcus pneumoniae NOT DETECTED NOT DETECTED Final   Streptococcus pyogenes NOT DETECTED NOT DETECTED Final   Acinetobacter baumannii NOT DETECTED NOT DETECTED Final   Enterobacteriaceae species NOT DETECTED NOT DETECTED Final   Enterobacter cloacae complex NOT DETECTED NOT DETECTED Final   Escherichia coli NOT DETECTED NOT DETECTED Final   Klebsiella oxytoca NOT DETECTED NOT DETECTED Final   Klebsiella pneumoniae NOT DETECTED NOT DETECTED Final   Proteus species NOT DETECTED NOT DETECTED Final   Serratia marcescens NOT DETECTED NOT DETECTED Final   Haemophilus influenzae NOT DETECTED NOT DETECTED Final   Neisseria meningitidis NOT DETECTED NOT DETECTED Final   Pseudomonas aeruginosa NOT DETECTED NOT DETECTED Final   Candida albicans NOT DETECTED NOT DETECTED Final   Candida glabrata NOT DETECTED NOT DETECTED Final   Candida krusei NOT DETECTED NOT DETECTED Final   Candida parapsilosis NOT DETECTED NOT DETECTED Final   Candida tropicalis NOT DETECTED NOT DETECTED Final  Culture, respiratory (NON-Expectorated)     Status: None (Preliminary result)   Collection Time: 12/18/16  3:01 PM  Result Value Ref Range Status   Specimen Description TRACHEAL ASPIRATE  Final   Special Requests NONE  Final   Gram Stain   Final    FEW WBC PRESENT, PREDOMINANTLY PMN NO ORGANISMS SEEN    Culture   Final    NO GROWTH 2 DAYS Performed at [facility], [address], [place], [place] [address]    Report Status PENDING  Incomplete  MRSA PCR Screening     Status: Abnormal   Collection Time: 12/18/16  4:38 PM  Result Value Ref Range Status   MRSA by PCR POSITIVE (A) NEGATIVE Final    Comment:        The GeneXpert  MRSA Assay (FDA approved for NASAL specimens only), is one component of a comprehensive MRSA colonization surveillance program. It is not intended to diagnose MRSA infection nor to guide or monitor treatment for MRSA infections. RESULT CALLED TO, READ BACK BY AND VERIFIED WITH: BARBARA ALLEN 12/18/16 AT 1800 BY HS      Coagulation Studies: Recent Labs    12/18/16 1452 12/20/16 0138  LABPROT 13.8 14.9  INR 1.07 1.18    Urinalysis: Recent Labs    12/18/16 1638  COLORURINE STRAW*  LABSPEC 1.005  PHURINE 6.0  GLUCOSEU >=500*  HGBUR SMALL*  BILIRUBINUR NEGATIVE  KETONESUR NEGATIVE  PROTEINUR 100*  NITRITE NEGATIVE  LEUKOCYTESUR NEGATIVE        Imaging: Dg Chest Port 1 View  Result Date: 12/21/2016 CLINICAL DATA:  Respiratory failure EXAM: PORTABLE CHEST 1 VIEW COMPARISON:  12/18/2016 FINDINGS: Cardiac shadow is again enlarged but stable. Endotracheal tube, nasogastric catheter and right jugular central line have  been removed in the interval. Patchy right basilar atelectasis remains but improved. The left lung is clear. No bony abnormality is noted. IMPRESSION: Interval removal of tubes and lines. Improving aeration in the right base with minimal residual atelectatic changes. Electronically Signed   By: Inez Catalina M.D.   On: 12/21/2016 07:17   US Abdomen Limited Ruq  Result Date: 12/19/2016 CLINICAL DATA:  Thrombocytopenia. EXAM: ULTRASOUND ABDOMEN LIMITED RIGHT UPPER QUADRANT COMPARISON:  CT scan of March 12, 2012. FINDINGS: Gallbladder: Status post cholecystectomy. Common bile duct: Diameter: 4 mm which is within normal limits. Liver: Increased echogenicity of hepatic parenchyma is noted with mildly nodular hepatic contours suggesting cirrhosis. No focal abnormality is noted. Portal vein is patent on color Doppler imaging with normal direction of blood flow towards the liver. IMPRESSION: Status post cholecystectomy. Increased echogenicity of hepatic parenchyma is  noted with slightly nodular hepatic margins suggesting possible hepatic cirrhosis. Electronically Signed   By: Marijo Conception, M.D.   On: 12/19/2016 15:40      Assessment & Plan: Pt is a 61 y.o.   female with poorly controlled diabetes, severe hypertension, chronic kidney disease stage III, chronic low back pain followed by [facility] neurosurgery, COPD, obstructive sleep apnea, hyperlipidemia, history of hepatitis C in 2016, chronic congestive CHF with EF 40-45%, admitted to the hospital for acute respiratory distress acute STEMI, acute renal failure, acute respiratory failure, lactic acidosis and upper GI bleed due to gastric ulcers.  1.  Acute renal failure, nonoliguric Likely secondary to hypotension and volume depletion due to ATN.  Strep viridans bacteremia. Today's creatinine 2.58, BUN 21, potassium 4.8 Volume status and electrolytes are acceptable.  No acute indication for dialysis We will continue to monitor  2.  Chronic kidney disease stage III Baseline creatinine 1.28, GFR 51 on December 18, 2016  3.  Diabetes type 2 with CKD Poorly controlled Hemoglobin A1c of > 14 % in July 2018.  Prior to that hemoglobin A1c of 10.9% in November 2017 Patient states that she has not taken insulin in the last year.  Her PMD was in the room and she has not been able to get transport to get there.  Now she wants to establish care locally -Ordered dietitian education for diabetes Patient is requesting podiatry evaluation  4.  Proteinuria Will obtain SPEP and UPEP,    HIV neg in 2016  5.  Non-STEMI Followed by cardiology.  Cardiac cath is contemplated.  Deferred until serum creatinine improves  6.  Possible hepatic cirrhosis as seen on ultrasound History of hepatitis C in 2016 We will follow

## 2016-12-21 NOTE — Progress Notes (Addendum)
PHARMACY - PHYSICIAN COMMUNICATION CRITICAL VALUE ALERT - BLOOD CULTURE IDENTIFICATION (BCID)  Results for orders placed or performed during the hospital encounter of 12/18/16  Blood Culture ID Panel (Reflexed) (Collected: 12/18/2016  8:49 AM)  Result Value Ref Range   Enterococcus species NOT DETECTED NOT DETECTED   Listeria monocytogenes NOT DETECTED NOT DETECTED   Staphylococcus species NOT DETECTED NOT DETECTED   Staphylococcus aureus NOT DETECTED NOT DETECTED   Streptococcus species NOT DETECTED NOT DETECTED   Streptococcus agalactiae NOT DETECTED NOT DETECTED   Streptococcus pneumoniae NOT DETECTED NOT DETECTED   Streptococcus pyogenes NOT DETECTED NOT DETECTED   Acinetobacter baumannii NOT DETECTED NOT DETECTED   Enterobacteriaceae species NOT DETECTED NOT DETECTED   Enterobacter cloacae complex NOT DETECTED NOT DETECTED   Escherichia coli NOT DETECTED NOT DETECTED   Klebsiella oxytoca NOT DETECTED NOT DETECTED   Klebsiella pneumoniae NOT DETECTED NOT DETECTED   Proteus species NOT DETECTED NOT DETECTED   Serratia marcescens NOT DETECTED NOT DETECTED   Haemophilus influenzae NOT DETECTED NOT DETECTED   Neisseria meningitidis NOT DETECTED NOT DETECTED   Pseudomonas aeruginosa NOT DETECTED NOT DETECTED   Candida albicans NOT DETECTED NOT DETECTED   Candida glabrata NOT DETECTED NOT DETECTED   Candida krusei NOT DETECTED NOT DETECTED   Candida parapsilosis NOT DETECTED NOT DETECTED   Candida tropicalis NOT DETECTED NOT DETECTED    Name of physician (or Provider) Contacted:  [place]   Changes to prescribed antibiotics required:  No, will watch and wait for sx of infection.   Nero Sawatzky D 12/21/2016  9:21 PM

## 2016-12-21 NOTE — Progress Notes (Signed)
Secretary clerk call podiatry for a consult for nail trimming. Dr. Vickki Muff stated "they do not come to the hospital for nail trimming. The pt needs to be seen out patient."

## 2016-12-21 NOTE — Care Management (Signed)
Patient has been weaned to RA.  Confirmed with patient that she no longer has chronic O2 in the home.  States she used to have O2 through [facility].

## 2016-12-21 NOTE — Progress Notes (Signed)
[place] at [place] NAME: [name]    MR#:  [id number]  DATE OF BIRTH:  06-01-1955  SUBJECTIVE:  CHIEF COMPLAINT:   Chief Complaint  Patient presents with  . Respiratory Distress   Patient states that breathing better currently on room air creatinine still high, cardiology awaiting possible cardiac cath  REVIEW OF SYSTEMS:    Review of Systems  Constitutional: Negative for chills, fever and malaise/fatigue.  HENT: Negative for congestion, ear discharge, ear pain, hearing loss and sore throat.   Eyes: Negative for blurred vision and double vision.  Respiratory: Negative for cough, sputum production and shortness of breath.   Cardiovascular: Negative for chest pain, palpitations, orthopnea and leg swelling.  Gastrointestinal: Negative for abdominal pain, diarrhea, nausea and vomiting.  Genitourinary: Negative for dysuria, frequency and hematuria.  Musculoskeletal: Negative for falls, joint pain and myalgias.  Skin: Negative for rash.  Neurological: Negative for dizziness, tingling, sensory change, focal weakness and seizures.  Psychiatric/Behavioral: Negative for depression and substance abuse.    DRUG ALLERGIES:   Allergies  Allergen Reactions  . Latex Anaphylaxis and Rash  . Gabapentin Nausea And Vomiting    VITALS:  Blood pressure (!) 141/74, pulse (!) 101, temperature 97.9 F (36.6 C), temperature source Oral, resp. rate (!) 22, height 5' 9"  (1.753 m), weight 209 lb 14.1 oz (95.2 kg), SpO2 97 %.  PHYSICAL EXAMINATION:   GENERAL:  61 y.o.-year-old patient lying in the bed with no acute distress.  EYES: Pupils equal, round, reactive to light and accommodation. No scleral icterus. Extraocular muscles intact.  HEENT: Head atraumatic, normocephalic. Oropharynx and nasopharynx clear.  NECK:  Supple, no jugular venous distention. No thyroid enlargement, no tenderness.  LUNGS: Normal breath sounds bilaterally, no wheezing,   or crepitation. No use of accessory muscles of respiration.  CARDIOVASCULAR: S1, S2 normal. No murmurs, rubs, or gallops.  ABDOMEN: Soft, nontender, nondistended. Bowel sounds present. No organomegaly or mass.  EXTREMITIES: No pedal edema, cyanosis, or clubbing.  NEUROLOGIC: right pupil fix, left pupil responsive to light. Off sedation and follows  Commands.power 5/5 all limbs, gait not checked. PSYCHIATRIC: The patient is alert and oriented.[name] Kitchen  SKIN: No obvious rash, lesion, or ulcer.   Physical Exam LABORATORY PANEL:   CBC Recent Labs  Lab 12/21/16 0217  WBC 11.0  HGB 10.8*  HCT 32.9*  PLT 102*   ------------------------------------------------------------------------------------------------------------------  Chemistries  Recent Labs  Lab 12/18/16 0849  12/18/16 2327  12/21/16 0217  NA 134*   < > 138   < > 137  K 2.6*   < > 4.1   < > 4.8  CL 102   < > 112*   < > 112*  CO2 17*   < > 20*   < > 17*  GLUCOSE 518*   < > 102*   < > 187*  BUN 15   < > 19   < > 21*  CREATININE 1.27*   < > 1.85*   < > 2.58*  CALCIUM 8.3*   < > 7.7*   < > 8.8*  MG  --   --  1.3*  --   --   AST 24  --   --   --   --   ALT 10*  --   --   --   --   ALKPHOS 106  --   --   --   --   BILITOT 0.4  --   --   --   --    < > =  values in this interval not displayed.   ------------------------------------------------------------------------------------------------------------------  Cardiac Enzymes Recent Labs  Lab 12/19/16 0435 12/19/16 1039  TROPONINI 17.52* 18.21*   ------------------------------------------------------------------------------------------------------------------  RADIOLOGY:  Dg Chest Port 1 View  Result Date: 12/21/2016 CLINICAL DATA:  Respiratory failure EXAM: PORTABLE CHEST 1 VIEW COMPARISON:  12/18/2016 FINDINGS: Cardiac shadow is again enlarged but stable. Endotracheal tube, nasogastric catheter and right jugular central line have been removed in the interval. Patchy  right basilar atelectasis remains but improved. The left lung is clear. No bony abnormality is noted. IMPRESSION: Interval removal of tubes and lines. Improving aeration in the right base with minimal residual atelectatic changes. Electronically Signed   By: Inez Catalina M.D.   On: 12/21/2016 07:17    ASSESSMENT AND PLAN:   Principal Problem:   Acute respiratory failure with hypoxia (HCC) Active Problems:   PNA (pneumonia)   CHF exacerbation (HCC)   Acute respiratory failure (HCC)   Sepsis (HCC)   Coffee ground emesis   NSTEMI (non-ST elevated myocardial infarction) ([place])   *Acute respiratory failure now resolved, felt to be due to multifactorial reasons  Due to Ac Diastolic CHF exacerbation-treated with Lasix currently diuresis on hold   Pneumonia and sepsis- suspected on admission, but ruled out.     * NSTEMI Continue heparin drip due to patient's creatinine being elevated cardiac cath is currently being held Continue aspirin therapy  * GI bleed due to upper GI bleed     On EGD- found to have gastric ulcer, no active bleed- suggest to re-start on heparin due to acute MI.     Continue IV Protonix  * Ac on ch renal failure- stage 3   MOnitor for now. Avoid nephrotoxin meds Plan is for cardiac catheterization I will ask nephrology to see.  * DM with uncontrolled blood sugar.   Continue Lantus and sliding scale insulin  * COPD    Does not have active wheeze.    on duoneb.   All the records are reviewed and case discussed with Care Management/Social Workerr. Management plans discussed with the patient, family and they are in agreement.  CODE STATUS: full.  TOTAL TIME TAKING CARE OF THIS PATIENT: 32 minutes.    POSSIBLE D/C IN 1-2 DAYS, DEPENDING ON CLINICAL CONDITION.   Dustin Flock M.D on 12/21/2016   Between 7am to 6pm - Pager - [phone]  After 6pm go to [email or web address] - password [email or web address] [facility]  Office   [phone]  CC: Primary care physician; [facility]  Note: This dictation was prepared with Dragon dictation along with smaller phrase technology. Any transcriptional errors that result from this process are unintentional.

## 2016-12-21 NOTE — Progress Notes (Signed)
Serum creatinine still too high for consideration for cardiac cath. Will need gentle hydration. Continue heparin for now following for evidence of further gi bleed.

## 2016-12-22 LAB — GLUCOSE, CAPILLARY
GLUCOSE-CAPILLARY: 153 mg/dL — AB (ref 65–99)
GLUCOSE-CAPILLARY: 135 mg/dL — AB (ref 65–99)
GLUCOSE-CAPILLARY: 190 mg/dL — AB (ref 65–99)
Glucose-Capillary: 210 mg/dL — ABNORMAL HIGH (ref 65–99)
Glucose-Capillary: 198 mg/dL — ABNORMAL HIGH (ref 65–99)
Glucose-Capillary: 184 mg/dL — ABNORMAL HIGH (ref 65–99)

## 2016-12-22 LAB — CBC
HCT: 32.2 % — ABNORMAL LOW (ref 35.0–47.0)
HEMOGLOBIN: 10.4 g/dL — AB (ref 12.0–16.0)
MCH: 27.3 pg (ref 26.0–34.0)
MCHC: 32.5 g/dL (ref 32.0–36.0)
MCV: 84.2 fL (ref 80.0–100.0)
PLATELETS: 107 10*3/uL — AB (ref 150–440)
RBC: 3.82 MIL/uL (ref 3.80–5.20)
RDW: 15 % — ABNORMAL HIGH (ref 11.5–14.5)
WBC: 8.9 10*3/uL (ref 3.6–11.0)

## 2016-12-22 LAB — BASIC METABOLIC PANEL
ANION GAP: 8 (ref 5–15)
BUN: 24 mg/dL — AB (ref 6–20)
CALCIUM: 8.6 mg/dL — AB (ref 8.9–10.3)
CO2: 19 mmol/L — ABNORMAL LOW (ref 22–32)
Chloride: 108 mmol/L (ref 101–111)
Creatinine, Ser: 2.33 mg/dL — ABNORMAL HIGH (ref 0.44–1.00)
GFR calc Af Amer: 25 mL/min — ABNORMAL LOW (ref >60)
GFR, EST NON AFRICAN AMERICAN: 21 mL/min — AB (ref >60)
GLUCOSE: 187 mg/dL — AB (ref 65–99)
POTASSIUM: 4.3 mmol/L (ref 3.5–5.1)
SODIUM: 135 mmol/L (ref 135–145)

## 2016-12-22 LAB — PROTEIN / CREATININE RATIO, URINE
CREATININE, URINE: 94 mg/dL
Protein Creatinine Ratio: 1.17 mg/mg{Cre} — ABNORMAL HIGH (ref 0.00–0.15)
TOTAL PROTEIN, URINE: 110 mg/dL

## 2016-12-22 LAB — HEPARIN LEVEL (UNFRACTIONATED): HEPARIN UNFRACTIONATED: 0.35 [IU]/mL (ref 0.30–0.70)

## 2016-12-22 MED ORDER — PANTOPRAZOLE SODIUM 40 MG PO TBEC
40.0000 mg | DELAYED_RELEASE_TABLET | Freq: Two times a day (BID) | ORAL | Status: DC
  Administered 2016-12-22 – 2016-12-26 (×8): 40 mg via ORAL
  Filled 2016-12-22 (×8): qty 1

## 2016-12-22 NOTE — Progress Notes (Signed)
Nutrition Education Note   RD consulted for nutrition education regarding diabetes.   Lab Results  Component Value Date   HGBA1C 10.8 (H) 12/06/2015    RD provided "Nutrition and Type II Diabetes" handout from the Academy of [employer] and Dietetics. Discussed different food groups and their effects on blood sugar, emphasizing carbohydrate-containing foods. Provided list of carbohydrates and recommended serving sizes of common foods.  Discussed importance of controlled and consistent carbohydrate intake throughout the day. Provided examples of ways to balance meals/snacks and encouraged intake of high-fiber, whole grain complex carbohydrates. Teach back method used.  Expect fair to poor compliance.  Body mass index is 30.99 kg/m. Pt meets criteria for obesity based on current BMI.  Current diet order is [place], patient is consuming approximately 50% of meals at this time.   RD following this pt  Koleen Distance MS, RD, LDN Pager #[phone] After Hours Pager: [phone]

## 2016-12-22 NOTE — Progress Notes (Signed)
[facility], [place] 12/22/16  Subjective:   Patient is doing much better today.  Her serum creatinine has improved slightly to 2.33 today She is getting diabetic education Breathing is much better.  Objective:  Vital signs in last 24 hours:  Temp:  [97.9 F (36.6 C)-98.5 F (36.9 C)] 97.9 F (36.6 C) (12/06 0544) Pulse Rate:  [98-102] 98 (12/06 0544) Resp:  [16-20] 20 (12/06 0544) BP: (119-150)/(63-85) 150/85 (12/06 0544) SpO2:  [94 %-95 %] 94 % (12/06 0544)  Weight change:  Filed Weights   12/18/16 0845 12/18/16 0908 12/18/16 1141  Weight: 107.1 kg (236 lb 1.8 oz) 107 kg (236 lb) 95.2 kg (209 lb 14.1 oz)    Intake/Output:    Intake/Output Summary (Last 24 hours) at 12/22/2016 1636 Last data filed at 12/22/2016 1300 Gross per 24 hour  Intake 1171 ml  Output 300 ml  Net 871 ml     Physical Exam: General:  Sitting up in the bed, no acute distress anicteric,   HEENT moist oral mucous membranes  Neck  supple  Pulm/lungs  normal breathing effort  CVS/Heart   soft systolic murmur, no rub  Abdomen:   Soft, nontender, nondistended  Extremities:  Trace to 1+ pitting edema  Neurologic:  Alert, oriented  Skin:  No acute rashes          Basic Metabolic Panel:  Recent Labs  Lab 12/18/16 1044 12/18/16 2327 12/19/16 0435 12/20/16 0417 12/21/16 0217 12/22/16 0444  NA 135 138 136  --  137 135  K 4.0 4.1 4.1  --  4.8 4.3  CL 104 112* 110  --  112* 108  CO2 18* 20* 17*  --  17* 19*  GLUCOSE 444* 102* 179*  --  187* 187*  BUN 15 19 19   --  21* 24*  CREATININE 1.28* 1.85* 1.90* 2.42* 2.58* 2.33*  CALCIUM 8.1* 7.7* 7.9*  --  8.8* 8.6*  MG  --  1.3*  --   --   --   --   PHOS  --  3.4  --   --   --   --      CBC: Recent Labs  Lab 12/18/16 0849  12/18/16 2327 12/19/16 0435 12/19/16 1039 12/19/16 2052 12/20/16 0417 12/21/16 0217 12/22/16 0444  WBC 12.2*   < > 12.2* 13.9*  --   --  11.4* 11.0 8.9  NEUTROABS 7.3*  --   --   --   --    --   --   --   --   HGB 13.8   < > 11.0* 11.5* 11.3* 10.9* 10.9* 10.8* 10.4*  HCT 43.1   < > 34.2* 34.8* 34.4* 33.2* 34.0* 32.9* 32.2*  MCV 85.2   < > 83.4 83.3  --   --  84.4 83.8 84.2  PLT 161   < > 115* 125*  --   --  99* 102* 107*   < > = values in this interval not displayed.     No results found for: HEPBSAG, HEPBSAB, HEPBIGM    Microbiology:  Recent Results (from the past 240 hour(s))  Culture, blood (routine x 2)     Status: Abnormal   Collection Time: 12/18/16  8:49 AM  Result Value Ref Range Status   Specimen Description BLOOD RIGHT ANTECUBITAL  Final   Special Requests   Final    BOTTLES DRAWN AEROBIC AND ANAEROBIC Blood Culture adequate volume   Culture  Setup Time  Final    GRAM POSITIVE COCCI ANAEROBIC BOTTLE ONLY CRITICAL RESULT CALLED TO, READ BACK BY AND VERIFIED WITH: MATT MCBANE AT [place] ON 12/19/16 [place].    Culture (A)  Final    VIRIDANS STREPTOCOCCUS THE SIGNIFICANCE OF ISOLATING THIS ORGANISM FROM A SINGLE SET OF BLOOD CULTURES WHEN MULTIPLE SETS ARE DRAWN IS UNCERTAIN. PLEASE NOTIFY THE MICROBIOLOGY DEPARTMENT WITHIN ONE WEEK IF SPECIATION AND SENSITIVITIES ARE REQUIRED. Performed at [facility], [place] [address], [place], [place] [address]    Report Status 12/21/2016 FINAL  Final  Culture, blood (routine x 2)     Status: None (Preliminary result)   Collection Time: 12/18/16  8:49 AM  Result Value Ref Range Status   Specimen Description BLOOD LEFT ANTECUBITAL  Final   Special Requests   Final    BOTTLES DRAWN AEROBIC AND ANAEROBIC Blood Culture adequate volume   Culture  Setup Time   Final    Organism ID to follow GRAM NEGATIVE RODS ANAEROBIC BOTTLE ONLY CRITICAL RESULT CALLED TO, READ BACK BY AND VERIFIED WITH: JASON ROBBINS 12/21/16 @ 2057  [place]    Culture GRAM NEGATIVE RODS  Final   Report Status PENDING  Incomplete  Blood Culture ID Panel (Reflexed)     Status: Abnormal   Collection Time: 12/18/16  8:49 AM  Result Value Ref Range Status    Enterococcus species NOT DETECTED NOT DETECTED Final   Listeria monocytogenes NOT DETECTED NOT DETECTED Final   Staphylococcus species NOT DETECTED NOT DETECTED Final   Staphylococcus aureus NOT DETECTED NOT DETECTED Final   Streptococcus species DETECTED (A) NOT DETECTED Final    Comment: Not Enterococcus species, Streptococcus agalactiae, Streptococcus pyogenes, or Streptococcus pneumoniae. CRITICAL RESULT CALLED TO, READ BACK BY AND VERIFIED WITH: MATT MCBANE AT 0701 ON 12/19/16 [place].    Streptococcus agalactiae NOT DETECTED NOT DETECTED Final   Streptococcus pneumoniae NOT DETECTED NOT DETECTED Final   Streptococcus pyogenes NOT DETECTED NOT DETECTED Final   Acinetobacter baumannii NOT DETECTED NOT DETECTED Final   Enterobacteriaceae species NOT DETECTED NOT DETECTED Final   Enterobacter cloacae complex NOT DETECTED NOT DETECTED Final   Escherichia coli NOT DETECTED NOT DETECTED Final   Klebsiella oxytoca NOT DETECTED NOT DETECTED Final   Klebsiella pneumoniae NOT DETECTED NOT DETECTED Final   Proteus species NOT DETECTED NOT DETECTED Final   Serratia marcescens NOT DETECTED NOT DETECTED Final   Haemophilus influenzae NOT DETECTED NOT DETECTED Final   Neisseria meningitidis NOT DETECTED NOT DETECTED Final   Pseudomonas aeruginosa NOT DETECTED NOT DETECTED Final   Candida albicans NOT DETECTED NOT DETECTED Final   Candida glabrata NOT DETECTED NOT DETECTED Final   Candida krusei NOT DETECTED NOT DETECTED Final   Candida parapsilosis NOT DETECTED NOT DETECTED Final   Candida tropicalis NOT DETECTED NOT DETECTED Final  Blood Culture ID Panel (Reflexed)     Status: None   Collection Time: 12/18/16  8:49 AM  Result Value Ref Range Status   Enterococcus species NOT DETECTED NOT DETECTED Final   Listeria monocytogenes NOT DETECTED NOT DETECTED Final   Staphylococcus species NOT DETECTED NOT DETECTED Final   Staphylococcus aureus NOT DETECTED NOT DETECTED Final   Streptococcus species  NOT DETECTED NOT DETECTED Final   Streptococcus agalactiae NOT DETECTED NOT DETECTED Final   Streptococcus pneumoniae NOT DETECTED NOT DETECTED Final   Streptococcus pyogenes NOT DETECTED NOT DETECTED Final   Acinetobacter baumannii NOT DETECTED NOT DETECTED Final   Enterobacteriaceae species NOT DETECTED NOT DETECTED Final  Enterobacter cloacae complex NOT DETECTED NOT DETECTED Final   Escherichia coli NOT DETECTED NOT DETECTED Final   Klebsiella oxytoca NOT DETECTED NOT DETECTED Final   Klebsiella pneumoniae NOT DETECTED NOT DETECTED Final   Proteus species NOT DETECTED NOT DETECTED Final   Serratia marcescens NOT DETECTED NOT DETECTED Final   Haemophilus influenzae NOT DETECTED NOT DETECTED Final   Neisseria meningitidis NOT DETECTED NOT DETECTED Final   Pseudomonas aeruginosa NOT DETECTED NOT DETECTED Final   Candida albicans NOT DETECTED NOT DETECTED Final   Candida glabrata NOT DETECTED NOT DETECTED Final   Candida krusei NOT DETECTED NOT DETECTED Final   Candida parapsilosis NOT DETECTED NOT DETECTED Final   Candida tropicalis NOT DETECTED NOT DETECTED Final  Culture, respiratory (NON-Expectorated)     Status: None   Collection Time: 12/18/16  3:01 PM  Result Value Ref Range Status   Specimen Description TRACHEAL ASPIRATE  Final   Special Requests NONE  Final   Gram Stain   Final    FEW WBC PRESENT, PREDOMINANTLY PMN NO ORGANISMS SEEN    Culture   Final    NO GROWTH 2 DAYS Performed at [facility], [address] [place], [place] [address]    Report Status 12/21/2016 FINAL  Final  MRSA PCR Screening     Status: Abnormal   Collection Time: 12/18/16  4:38 PM  Result Value Ref Range Status   MRSA by PCR POSITIVE (A) NEGATIVE Final    Comment:        The GeneXpert MRSA Assay (FDA approved for NASAL specimens only), is one component of a comprehensive MRSA colonization surveillance program. It is not intended to diagnose MRSA infection nor to guide or monitor  treatment for MRSA infections. RESULT CALLED TO, READ BACK BY AND VERIFIED WITH: BARBARA ALLEN 12/18/16 AT 1800 BY HS     Coagulation Studies: Recent Labs    12/20/16 0138  LABPROT 14.9  INR 1.18    Urinalysis: No results for input(s): COLORURINE, LABSPEC, PHURINE, GLUCOSEU, HGBUR, BILIRUBINUR, KETONESUR, PROTEINUR, UROBILINOGEN, NITRITE, LEUKOCYTESUR in the last 72 hours.  Invalid input(s): APPERANCEUR    Imaging: Dg Chest Port 1 View  Result Date: 12/21/2016 CLINICAL DATA:  Respiratory failure EXAM: PORTABLE CHEST 1 VIEW COMPARISON:  12/18/2016 FINDINGS: Cardiac shadow is again enlarged but stable. Endotracheal tube, nasogastric catheter and right jugular central line have been removed in the interval. Patchy right basilar atelectasis remains but improved. The left lung is clear. No bony abnormality is noted. IMPRESSION: Interval removal of tubes and lines. Improving aeration in the right base with minimal residual atelectatic changes. Electronically Signed   By: Inez Catalina M.D.   On: 12/21/2016 07:17     Medications:   . sodium chloride 10 mL/hr at 12/21/16 0855  . heparin 1,400 Units/hr (12/22/16 0912)   . aspirin  325 mg Oral Daily  . atorvastatin  80 mg Per Tube q1800  . Chlorhexidine Gluconate Cloth  6 each Topical Q0600  . insulin aspart  0-15 Units Subcutaneous TID WC  . insulin aspart  0-5 Units Subcutaneous QHS  . insulin glargine  10 Units Subcutaneous Daily  . mupirocin ointment  1 application Nasal BID  . pantoprazole  40 mg Oral BID AC  . pregabalin  100 mg Oral TID   docusate sodium, ipratropium-albuterol  Assessment/ Plan:  61 y.o. female with poorly controlled diabetes, severe hypertension, chronic kidney disease stage III, chronic low back pain followed by [facility] neurosurgery, COPD, obstructive sleep apnea,  hyperlipidemia, history of hepatitis C in 2016, chronic congestive CHF with EF 40-45%, admitted to the hospital for acute respiratory distress acute  STEMI, acute renal failure, acute respiratory failure, lactic acidosis and upper GI bleed due to gastric ulcers.  1.  Acute renal failure, nonoliguric Likely secondary to hypotension and volume depletion due to ATN.  Strep viridans bacteremia. Today's creatinine 2.33, BUN 24, potassium 4.3 Volume status and electrolytes are acceptable.  No acute indication for dialysis We will continue to monitor  2.  Chronic kidney disease stage III Baseline creatinine 1.28, GFR 51 on December 18, 2016 Outpatient follow-up.  Patient is thinking about moving to [place].  3.  Diabetes type 2 with CKD Poorly controlled Hemoglobin A1c of > 14 % in July 2018.  Prior to that hemoglobin A1c of 10.9% in November 2017 Patient states that she has not taken insulin in the last year.  -Dietary education provided  4.  Proteinuria Likely secondary to diabetes.  SPEP, UPEP pending  5.  Non-STEMI Followed by cardiology.  Cardiac cath is contemplated.  Deferred until serum creatinine improves  6.  Possible hepatic cirrhosis as seen on ultrasound History of hepatitis C in 2016 We will follow     LOS: 4 Jelan Batterton 12/6/20184:36 PM  [facility] [place], [place]

## 2016-12-22 NOTE — Progress Notes (Signed)
[place] at [place] NAME: [name]    MR#:  [id number]  DATE OF BIRTH:  03/04/1955  SUBJECTIVE:  CHIEF COMPLAINT:   Chief Complaint  Patient presents with  . Respiratory Distress   Patient's breathing continues to improve, her renal function also improved she denies any chest pains  REVIEW OF SYSTEMS:    Review of Systems  Constitutional: Negative for chills, fever and malaise/fatigue.  HENT: Negative for congestion, ear discharge, ear pain, hearing loss and sore throat.   Eyes: Negative for blurred vision and double vision.  Respiratory: Negative for cough, sputum production and shortness of breath.   Cardiovascular: Negative for chest pain, palpitations, orthopnea and leg swelling.  Gastrointestinal: Negative for abdominal pain, diarrhea, nausea and vomiting.  Genitourinary: Negative for dysuria, frequency and hematuria.  Musculoskeletal: Negative for falls, joint pain and myalgias.  Skin: Negative for rash.  Neurological: Negative for dizziness, tingling, sensory change, focal weakness and seizures.  Psychiatric/Behavioral: Negative for depression and substance abuse.    DRUG ALLERGIES:   Allergies  Allergen Reactions  . Latex Anaphylaxis and Rash  . Gabapentin Nausea And Vomiting    VITALS:  Blood pressure (!) 150/85, pulse 98, temperature 97.9 F (36.6 C), temperature source Oral, resp. rate 20, height 5' 9"  (1.753 m), weight 209 lb 14.1 oz (95.2 kg), SpO2 94 %.  PHYSICAL EXAMINATION:   GENERAL:  61 y.o.-year-old patient lying in the bed with no acute distress.  EYES: Pupils equal, round, reactive to light and accommodation. No scleral icterus. Extraocular muscles intact.  HEENT: Head atraumatic, normocephalic. Oropharynx and nasopharynx clear.  NECK:  Supple, no jugular venous distention. No thyroid enlargement, no tenderness.  LUNGS: Normal breath sounds bilaterally, no wheezing,  or crepitation. No use of  accessory muscles of respiration.  CARDIOVASCULAR: S1, S2 normal. No murmurs, rubs, or gallops.  ABDOMEN: Soft, nontender, nondistended. Bowel sounds present. No organomegaly or mass.  EXTREMITIES: No pedal edema, cyanosis, or clubbing.  NEUROLOGIC: right pupil fix, left pupil responsive to light. Off sedation and follows  Commands.power 5/5 all limbs, gait not checked. PSYCHIATRIC: The patient is alert and oriented.[name] Kitchen  SKIN: No obvious rash, lesion, or ulcer.   Physical Exam LABORATORY PANEL:   CBC Recent Labs  Lab 12/22/16 0444  WBC 8.9  HGB 10.4*  HCT 32.2*  PLT 107*   ------------------------------------------------------------------------------------------------------------------  Chemistries  Recent Labs  Lab 12/18/16 0849  12/18/16 2327  12/22/16 0444  NA 134*   < > 138   < > 135  K 2.6*   < > 4.1   < > 4.3  CL 102   < > 112*   < > 108  CO2 17*   < > 20*   < > 19*  GLUCOSE 518*   < > 102*   < > 187*  BUN 15   < > 19   < > 24*  CREATININE 1.27*   < > 1.85*   < > 2.33*  CALCIUM 8.3*   < > 7.7*   < > 8.6*  MG  --   --  1.3*  --   --   AST 24  --   --   --   --   ALT 10*  --   --   --   --   ALKPHOS 106  --   --   --   --   BILITOT 0.4  --   --   --   --    < > =  values in this interval not displayed.   ------------------------------------------------------------------------------------------------------------------  Cardiac Enzymes Recent Labs  Lab 12/19/16 0435 12/19/16 1039  TROPONINI 17.52* 18.21*   ------------------------------------------------------------------------------------------------------------------  RADIOLOGY:  Dg Chest Port 1 View  Result Date: 12/21/2016 CLINICAL DATA:  Respiratory failure EXAM: PORTABLE CHEST 1 VIEW COMPARISON:  12/18/2016 FINDINGS: Cardiac shadow is again enlarged but stable. Endotracheal tube, nasogastric catheter and right jugular central line have been removed in the interval. Patchy right basilar atelectasis  remains but improved. The left lung is clear. No bony abnormality is noted. IMPRESSION: Interval removal of tubes and lines. Improving aeration in the right base with minimal residual atelectatic changes. Electronically Signed   By: Inez Catalina M.D.   On: 12/21/2016 07:17    ASSESSMENT AND PLAN:   Principal Problem:   Acute respiratory failure with hypoxia (HCC) Active Problems:   PNA (pneumonia)   CHF exacerbation (HCC)   Acute respiratory failure (HCC)   Sepsis (HCC)   Coffee ground emesis   NSTEMI (non-ST elevated myocardial infarction) ([place])   *Acute respiratory failure now resolved, felt to be due to multifactorial reasons  Due to Ac Diastolic CHF exacerbation-treated with Lasix currently diuresis on hold due to acute renal failure   Pneumonia and sepsis- suspected on admission, but ruled out.  Not on any antibiotics     * NSTEMI Continue heparin drip due to patient's creatinine being elevated cardiac cath is currently being held Continue aspirin therapy Plan for cath depending on further improvement in renal function  * GI bleed due to upper GI bleed     On EGD- found to have gastric ulcer, no active bleed- suggest to re-start on heparin due to acute MI.     Continue can change to oral Lasix  * Ac on ch renal failure- stage 3   MOnitor for now. Avoid nephrotoxin meds Renal function with improvement appreciate nephrology input  * DM with uncontrolled blood sugar.   Continue Lantus and sliding scale insulin  * COPD    Does not have active wheeze.    on duoneb.   All the records are reviewed and case discussed with Care Management/Social Workerr. Management plans discussed with the patient, family and they are in agreement.  CODE STATUS: full.  TOTAL TIME TAKING CARE OF THIS PATIENT: 25 minutes.    POSSIBLE D/C IN 1-2 DAYS, DEPENDING ON CLINICAL CONDITION.   Dustin Flock M.D on 12/22/2016   Between 7am to 6pm - Pager - [phone]  After 6pm go to  [email or web address] - password [email or web address] [facility]  Office  [phone]  CC: Primary care physician; [facility]  Note: This dictation was prepared with Dragon dictation along with smaller phrase technology. Any transcriptional errors that result from this process are unintentional.

## 2016-12-22 NOTE — Progress Notes (Signed)
ANTICOAGULATION CONSULT NOTE - Follow Up Consult  Pharmacy Consult for heparin Indication: chest pain/ACS  Allergies  Allergen Reactions  . Latex Anaphylaxis and Rash  . Gabapentin Nausea And Vomiting    Patient Measurements: Height: 5' 9"  (175.3 cm) Weight: 209 lb 14.1 oz (95.2 kg) IBW/kg (Calculated) : 66.2 Heparin Dosing Weight: 90 kg  Vital Signs: Temp: 98.5 F (36.9 C) (12/05 2016) Temp Source: Oral (12/05 2016) BP: 119/63 (12/05 2016) Pulse Rate: 101 (12/05 2111)  Labs: Recent Labs    12/19/16 1039  12/20/16 0138 12/20/16 0417  12/21/16 0217 12/21/16 0755 12/22/16 0444  HGB 11.3*   < >  --  10.9*  --  10.8*  --  10.4*  HCT 34.4*   < >  --  34.0*  --  32.9*  --  32.2*  PLT  --   --   --  99*  --  102*  --  107*  APTT  --   --  76*  --   --   --   --   --   LABPROT  --   --  14.9  --   --   --   --   --   INR  --   --  1.18  --   --   --   --   --   HEPARINUNFRC  --   --  0.25*  --    < > 0.50 0.37 0.35  CREATININE  --   --   --  2.42*  --  2.58*  --   --   TROPONINI 18.21*  --   --   --   --   --   --   --    < > = values in this interval not displayed.    Estimated Creatinine Clearance: 27.8 mL/min (A) (by C-G formula based on SCr of 2.58 mg/dL (H)).   Medical History: Past Medical History:  Diagnosis Date  . CHF (congestive heart failure) ([place])   . COPD (chronic obstructive pulmonary disease) ([place])   . Diabetes mellitus without complication (HCC)     Medications:  Infusions:  . sodium chloride 10 mL/hr at 12/21/16 0855  . heparin 1,400 Units/hr (12/22/16 0432)    Assessment: 62 yof cc respiratory distress, PMH CHF, COPD, DM, tobacco use. Troponin 0.04 - 0.85 -, ECG shows sinus tachycardia, LVH with secondary repolarization abnormality, and prolonged QT interval. Pharmacy consulted to dose UFH for ACS.   Goal of Therapy:  Heparin level 0.3-0.7 units/ml Monitor platelets by anticoagulation protocol: Yes   Plan:  Heparin stopped due to  hematemesis. OK to resume heparin drip after EGD per GI. Will resume heparin without bolus at 1100 units/hr and check a HL in 6 hours.   12/4 @ 0130 HL 0.25 subtherapeutic. Of note aPTT 76, patient does not appear to be on any PTA anticoagulants per care everywhere. Will rebolus w/ heparin 1400 units IV x 1 and increase rate to 1250 units/hr, will recheck HL @ 1030 and CBC w/ am labs.  12/4: Heparin level resulted @ 0.41. Will recheck Heparin level @ 1800  12/4 Heparin level low at 0.24. Confirmed with RN that drip had not been held. Bolus 1350 units and increase rate to 1400 units/hr. Recheck level in 6 hours  12/5 @ 0200 HL 0.50 therapeutic. Will continue current rate and will recheck HL @ 0800.  12/5 @ 0755 HL resulted @ 0.37 therapeutic. Will continue current rate and will  recheck HL in am.   12/6 @ 0444 HL 0.35 therapeutic. Will continue current rate and will recheck HL and CBC w/ am labs.  Tobie Lords, PharmD, BCPS Clinical Pharmacist 12/22/2016

## 2016-12-22 NOTE — Progress Notes (Signed)
* [facility]      IMPRESSION: -Smoker -H/O COPD - not currently wheezing -Pulmonary edema - clinically improving -Severe hypertension, controlled -AKI/CKD OSA-- was on auto-pap last night and tolerated well.   PLAN/REC: Continue CPAP while inpatient, we will arrange outpatient follow up to get her back on PAP.     Date: 12/22/2016  MRN# [id number] [name] [birth year]   [name] is a 61 y.o. old female seen in follow up for chief complaint of  Chief Complaint  Patient presents with  . Respiratory Distress     HPI:  Patient feels that she tolerated the PAP well last night, no new complaints today.    Medication:    Current Facility-Administered Medications:  .  0.9 %  sodium chloride infusion, , Intravenous, Continuous, Fath, Javier Docker, MD, Last Rate: 10 mL/hr at 12/21/16 0855 .  aspirin tablet 325 mg, 325 mg, Oral, Daily, Tukov, Magadalene S, NP, 325 mg at 12/22/16 1125 .  atorvastatin (LIPITOR) tablet 80 mg, 80 mg, Per Tube, q1800, Nettie Elm, MD, 80 mg at 12/21/16 1700 .  Chlorhexidine Gluconate Cloth 2 % PADS 6 each, 6 each, Topical, Q0600, Nettie Elm, MD, 6 each at 12/22/16 0501 .  docusate sodium (COLACE) capsule 100 mg, 100 mg, Oral, BID PRN, Vaughan Basta, MD .  heparin ADULT infusion 100 units/mL (25000 units/241m sodium chloride 0.45%), 1,400 Units/hr, Intravenous, Continuous, MRamond Dial RPH, Last Rate: 14 mL/hr at 12/22/16 0432, 1,400 Units/hr at 12/22/16 0432 .  insulin aspart (novoLOG) injection 0-15 Units, 0-15 Units, Subcutaneous, TID WC, SWilhelmina Mcardle MD, 3 Units at 12/22/16 1316 .  insulin aspart (novoLOG) injection 0-5 Units, 0-5 Units, Subcutaneous, QHS, Simonds, David B, MD .  insulin glargine (LANTUS) injection 10 Units, 10 Units, Subcutaneous, Daily, SWilhelmina Mcardle MD, 10 Units at 12/22/16 1125 .  ipratropium-albuterol (DUONEB) 0.5-2.5 (3) MG/3ML nebulizer solution 3 mL, 3 mL,  Nebulization, Q4H PRN, SWilhelmina Mcardle MD .  mupirocin ointment (BACTROBAN) 2 % 1 application, 1 application, Nasal, BID, KNettie Elm MD, 1 application at 107/12/191154 .  pantoprazole (PROTONIX) EC tablet 40 mg, 40 mg, Oral, BID AC, PDustin Flock MD .  pregabalin (LYRICA) capsule 100 mg, 100 mg, Oral, TID, SWilhelmina Mcardle MD, 100 mg at 12/22/16 1126   Allergies:  Latex and Gabapentin  Review of Systems: Gen:  Denies  fever, sweats. HEENT: Denies blurred vision. Cvc:  No dizziness, chest pain or heaviness Resp:   Denies cough or sputum porduction. Gi: Denies swallowing difficulty, stomach pain. constipation, bowel incontinence Gu:  Denies bladder incontinence, burning urine Ext:   No Joint pain, stiffness. Skin: No skin rash, easy bruising. Endoc:  No polyuria, polydipsia. Psych: No depression, insomnia. Other:  All other systems were reviewed and found to be negative other than what is mentioned in the HPI.   Physical Examination:   VS: BP (!) 150/85 (BP Location: Left Arm)   Pulse 98   Temp 97.9 F (36.6 C) (Oral)   Resp 20   Ht 5' 9"  (1.753 m)   Wt 209 lb 14.1 oz (95.2 kg)   SpO2 94%   BMI 30.99 kg/m   General Appearance: No distress  Neuro:without focal findings,  speech normal,  HEENT: PERRLA, EOM intact. Pulmonary: normal breath sounds, No wheezing.   CardiovascularNormal S1,S2.  No m/r/g.   Abdomen: Benign, Soft, non-tender. Renal:  No costovertebral tenderness  GU:  Not performed at this time. Endoc: No evident  thyromegaly, no signs of acromegaly. Skin:   warm, no rash. Extremities: normal, no cyanosis, clubbing.   LABORATORY PANEL:   CBC Recent Labs  Lab 12/22/16 0444  WBC 8.9  HGB 10.4*  HCT 32.2*  PLT 107*   ------------------------------------------------------------------------------------------------------------------  Chemistries  Recent Labs  Lab 12/18/16 0849  12/18/16 2327  12/22/16 0444  NA 134*   < > 138   < > 135  K 2.6*    < > 4.1   < > 4.3  CL 102   < > 112*   < > 108  CO2 17*   < > 20*   < > 19*  GLUCOSE 518*   < > 102*   < > 187*  BUN 15   < > 19   < > 24*  CREATININE 1.27*   < > 1.85*   < > 2.33*  CALCIUM 8.3*   < > 7.7*   < > 8.6*  MG  --   --  1.3*  --   --   AST 24  --   --   --   --   ALT 10*  --   --   --   --   ALKPHOS 106  --   --   --   --   BILITOT 0.4  --   --   --   --    < > = values in this interval not displayed.   ------------------------------------------------------------------------------------------------------------------  Cardiac Enzymes Recent Labs  Lab 12/19/16 1039  TROPONINI 18.21*   ------------------------------------------------------------  RADIOLOGY:   No results found for this or any previous visit. Results for orders placed during the hospital encounter of 10/28/15  DG Chest 2 View   Narrative CLINICAL DATA:  Difficulty breathing.  EXAM: CHEST  2 VIEW  COMPARISON:  08/29/2015.  FINDINGS: Trachea is midline. Heart size normal. There maybe airspace disease in the left lower lobe. Lungs are otherwise clear. No pleural fluid. Flowing anterior osteophytosis in the thoracic spine.  IMPRESSION: Possible left lower lobe airspace opacification. Pneumonia cannot be excluded. Consider follow-up PA and lateral views of the chest in 3-4 weeks to ensure resolution, as clinically indicated, as malignancy cannot be definitively excluded.   Electronically Signed   By: Lorin Picket M.D.   On: 10/28/2015 14:40    ------------------------------------------------------------------------------------------------------------------  Thank  you for allowing [facility] to assist in the care of your patient. Our recommendations are noted above.  Please contact us if we can be of further service.   Marda Stalker, MD.  [facility] Office Number: [phone]  Patricia Pesa, M.D.  Merton Border, M.D  12/22/2016

## 2016-12-23 ENCOUNTER — Encounter: Payer: Self-pay | Admitting: *Deleted

## 2016-12-23 ENCOUNTER — Encounter
Admission: EM | Disposition: A | Discharge status: Home / Self Care | Payer: Self-pay | Source: Home / Self Care | Attending: Internal Medicine

## 2016-12-23 HISTORY — PX: LEFT HEART CATH AND CORONARY ANGIOGRAPHY: CATH118249

## 2016-12-23 LAB — CBC
HEMATOCRIT: 32.9 % — AB (ref 35.0–47.0)
Hemoglobin: 10.9 g/dL — ABNORMAL LOW (ref 12.0–16.0)
MCH: 27.8 pg (ref 26.0–34.0)
MCHC: 33 g/dL (ref 32.0–36.0)
MCV: 84.3 fL (ref 80.0–100.0)
PLATELETS: 103 10*3/uL — AB (ref 150–440)
RBC: 3.9 MIL/uL (ref 3.80–5.20)
RDW: 15 % — AB (ref 11.5–14.5)
WBC: 8.1 10*3/uL (ref 3.6–11.0)

## 2016-12-23 LAB — PROTEIN ELECTRO, RANDOM URINE
ALBUMIN ELP UR: 43.3 %
ALPHA-1-GLOBULIN, U: 8.4 %
ALPHA-2-GLOBULIN, U: 12.1 %
BETA GLOBULIN, U: 18.7 %
GAMMA GLOBULIN, U: 17.5 %
Total Protein, Urine: 80.3 mg/dL

## 2016-12-23 LAB — GLUCOSE, CAPILLARY
GLUCOSE-CAPILLARY: 170 mg/dL — AB (ref 65–99)
GLUCOSE-CAPILLARY: 143 mg/dL — AB (ref 65–99)
GLUCOSE-CAPILLARY: 177 mg/dL — AB (ref 65–99)
Glucose-Capillary: 283 mg/dL — ABNORMAL HIGH (ref 65–99)

## 2016-12-23 LAB — BASIC METABOLIC PANEL
Anion gap: 8 (ref 5–15)
BUN: 25 mg/dL — ABNORMAL HIGH (ref 6–20)
CALCIUM: 8.5 mg/dL — AB (ref 8.9–10.3)
CHLORIDE: 107 mmol/L (ref 101–111)
CO2: 20 mmol/L — AB (ref 22–32)
CREATININE: 1.99 mg/dL — AB (ref 0.44–1.00)
GFR calc Af Amer: 30 mL/min — ABNORMAL LOW (ref >60)
GFR calc non Af Amer: 26 mL/min — ABNORMAL LOW (ref >60)
GLUCOSE: 198 mg/dL — AB (ref 65–99)
Potassium: 4.5 mmol/L (ref 3.5–5.1)
Sodium: 135 mmol/L (ref 135–145)

## 2016-12-23 LAB — PROTEIN ELECTROPHORESIS, SERUM
A/G RATIO SPE: 0.7 (ref 0.7–1.7)
ALBUMIN ELP: 2.5 g/dL — AB (ref 2.9–4.4)
ALPHA-1-GLOBULIN: 0.3 g/dL (ref 0.0–0.4)
ALPHA-2-GLOBULIN: 1 g/dL (ref 0.4–1.0)
BETA GLOBULIN: 1 g/dL (ref 0.7–1.3)
Gamma Globulin: 1.4 g/dL (ref 0.4–1.8)
Globulin, Total: 3.8 g/dL (ref 2.2–3.9)
Total Protein ELP: 6.3 g/dL (ref 6.0–8.5)

## 2016-12-23 LAB — HEPARIN LEVEL (UNFRACTIONATED): Heparin Unfractionated: 0.19 IU/mL — ABNORMAL LOW (ref 0.30–0.70)

## 2016-12-23 SURGERY — LEFT HEART CATH AND CORONARY ANGIOGRAPHY
Anesthesia: Moderate Sedation

## 2016-12-23 MED ORDER — SODIUM CHLORIDE 0.9% FLUSH: 3.0000 mL | INTRAVENOUS | Status: DC | PRN

## 2016-12-23 MED ORDER — HEPARIN BOLUS VIA INFUSION
1400.0000 [IU] | Freq: Once | INTRAVENOUS | Status: AC
  Administered 2016-12-23: 1400 [IU] via INTRAVENOUS
  Filled 2016-12-23: qty 1400

## 2016-12-23 MED ORDER — CARVEDILOL 3.125 MG PO TABS: 3.1250 mg | ORAL_TABLET | Freq: Two times a day (BID) | ORAL | 0 refills | Status: DC

## 2016-12-23 MED ORDER — MIDAZOLAM HCL 2 MG/2ML IJ SOLN
INTRAMUSCULAR | Status: AC
  Filled 2016-12-23: qty 2

## 2016-12-23 MED ORDER — FENTANYL CITRATE (PF) 100 MCG/2ML IJ SOLN
INTRAMUSCULAR | Status: DC | PRN
Start: 2016-12-23 — End: 2016-12-23
  Administered 2016-12-23: 25 ug via INTRAVENOUS

## 2016-12-23 MED ORDER — MIDAZOLAM HCL 2 MG/2ML IJ SOLN
INTRAMUSCULAR | Status: DC | PRN
  Administered 2016-12-23: 1 mg via INTRAVENOUS

## 2016-12-23 MED ORDER — SODIUM CHLORIDE 0.9% FLUSH
3.0000 mL | Freq: Two times a day (BID) | INTRAVENOUS | Status: DC
  Administered 2016-12-23 – 2016-12-25 (×4): 3 mL via INTRAVENOUS

## 2016-12-23 MED ORDER — SODIUM CHLORIDE 0.9 % IV SOLN: 250.0000 mL | INTRAVENOUS | Status: DC | PRN

## 2016-12-23 MED ORDER — ISOSORBIDE MONONITRATE ER 30 MG PO TB24
60.0000 mg | ORAL_TABLET | Freq: Every day | ORAL | Status: DC
  Administered 2016-12-23 – 2016-12-24 (×2): 60 mg via ORAL
  Filled 2016-12-23 (×2): qty 2

## 2016-12-23 MED ORDER — ONDANSETRON HCL 4 MG/2ML IJ SOLN: 4.0000 mg | Freq: Four times a day (QID) | INTRAMUSCULAR | Status: DC | PRN

## 2016-12-23 MED ORDER — SODIUM CHLORIDE 0.9 % IV SOLN
INTRAVENOUS | Status: DC
  Administered 2016-12-23: 11:00:00 via INTRAVENOUS

## 2016-12-23 MED ORDER — FENTANYL CITRATE (PF) 100 MCG/2ML IJ SOLN
INTRAMUSCULAR | Status: AC
  Filled 2016-12-23: qty 2

## 2016-12-23 MED ORDER — SODIUM CHLORIDE 0.9 % IV SOLN
INTRAVENOUS | Status: AC
  Administered 2016-12-23: 14:00:00 via INTRAVENOUS

## 2016-12-23 MED ORDER — ASPIRIN 81 MG PO CHEW: 81.0000 mg | CHEWABLE_TABLET | ORAL | Status: AC

## 2016-12-23 MED ORDER — ASPIRIN 325 MG PO TABS: 325.0000 mg | ORAL_TABLET | Freq: Every day | ORAL | Status: DC

## 2016-12-23 MED ORDER — FUROSEMIDE 10 MG/ML IJ SOLN
INTRAMUSCULAR | Status: AC
  Filled 2016-12-23: qty 2

## 2016-12-23 MED ORDER — ACETAMINOPHEN 325 MG PO TABS
650.0000 mg | ORAL_TABLET | ORAL | Status: DC | PRN
  Administered 2016-12-24: 650 mg via ORAL
  Filled 2016-12-23: qty 2

## 2016-12-23 MED ORDER — CARVEDILOL 3.125 MG PO TABS
3.1250 mg | ORAL_TABLET | Freq: Two times a day (BID) | ORAL | Status: DC
  Administered 2016-12-23 – 2016-12-26 (×6): 3.125 mg via ORAL
  Filled 2016-12-23 (×7): qty 1

## 2016-12-23 MED ORDER — PANTOPRAZOLE SODIUM 40 MG PO TBEC: 40.0000 mg | DELAYED_RELEASE_TABLET | Freq: Two times a day (BID) | ORAL | 0 refills | Status: DC

## 2016-12-23 MED ORDER — SODIUM CHLORIDE 0.9% FLUSH
3.0000 mL | Freq: Two times a day (BID) | INTRAVENOUS | Status: DC
  Administered 2016-12-23: 3 mL via INTRAVENOUS

## 2016-12-23 MED ORDER — INSULIN GLARGINE 100 UNIT/ML ~~LOC~~ SOLN: 10.0000 [IU] | Freq: Every day | SUBCUTANEOUS | 11 refills | Status: DC

## 2016-12-23 MED ORDER — LIDOCAINE HCL (PF) 1 % IJ SOLN
INTRAMUSCULAR | Status: AC
  Filled 2016-12-23: qty 30

## 2016-12-23 MED ORDER — FUROSEMIDE 10 MG/ML IJ SOLN
20.0000 mg | Freq: Once | INTRAMUSCULAR | Status: AC
  Administered 2016-12-23: 20 mg via INTRAVENOUS

## 2016-12-23 MED ORDER — SODIUM CHLORIDE 0.9% FLUSH
3.0000 mL | INTRAVENOUS | Status: DC | PRN
  Administered 2016-12-23: 3 mL via INTRAVENOUS
  Filled 2016-12-23: qty 3

## 2016-12-23 MED ORDER — HEPARIN (PORCINE) IN NACL 2-0.9 UNIT/ML-% IJ SOLN
INTRAMUSCULAR | Status: AC
  Filled 2016-12-23: qty 500

## 2016-12-23 SURGICAL SUPPLY — 9 items
CATH 5FR PIGTAIL DIAGNOSTIC (CATHETERS) ×3 IMPLANT
CATH INFINITI 5FR JL4 (CATHETERS) ×3 IMPLANT
CATH INFINITI JR4 5F (CATHETERS) ×3 IMPLANT
DEVICE CLOSURE MYNXGRIP 5F (Vascular Products) ×3 IMPLANT
GUIDEWIRE 3MM J TIP .035 145 (WIRE) ×3 IMPLANT
KIT MANI 3VAL PERCEP (MISCELLANEOUS) ×3 IMPLANT
NEEDLE PERC 18GX7CM (NEEDLE) ×3 IMPLANT
PACK CARDIAC CATH (CUSTOM PROCEDURE TRAY) ×3 IMPLANT
SHEATH AVANTI 5FR X 11CM (SHEATH) ×3 IMPLANT

## 2016-12-23 NOTE — Progress Notes (Signed)
Patient has refused cpap for the night

## 2016-12-23 NOTE — Progress Notes (Signed)
Cardiac cath completed today with no immediate complications.  LM normal LV 90% proximal lad stenosis LCx- 80% proximal stenosis RCA-100% proximal stenosis with collaterals from left  EF 40-45% by echo  REC: Will need consideration for 3 vessel cabg. WIll need optimization of renal function and completed treatment of sepsis.  WIll evaluate in am regarding renal funciton Consider discharge in am with outpatient follow up and referral for consideration for cabg.

## 2016-12-23 NOTE — Progress Notes (Signed)
[facility] [facility] [place] NAME: [name]    MR#:  [id number]  DATE OF BIRTH:  08/10/1955  SUBJECTIVE:  CHIEF COMPLAINT:   Chief Complaint  Patient presents with  . Respiratory Distress   Patient was doing very well and plan was for her to be discharged with outpatient cardiology follow-up however her husband became very upset and patient underwent a cardiac cath  REVIEW OF SYSTEMS:    Review of Systems  Constitutional: Negative for chills, fever and malaise/fatigue.  HENT: Negative for congestion, ear discharge, ear pain, hearing loss and sore throat.   Eyes: Negative for blurred vision and double vision.  Respiratory: Negative for cough, sputum production and shortness of breath.   Cardiovascular: Negative for chest pain, palpitations, orthopnea and leg swelling.  Gastrointestinal: Negative for abdominal pain, diarrhea, nausea and vomiting.  Genitourinary: Negative for dysuria, frequency and hematuria.  Musculoskeletal: Negative for falls, joint pain and myalgias.  Skin: Negative for rash.  Neurological: Negative for dizziness, tingling, sensory change, focal weakness and seizures.  Psychiatric/Behavioral: Negative for depression and substance abuse.    DRUG ALLERGIES:   Allergies  Allergen Reactions  . Latex Anaphylaxis and Rash  . Gabapentin Nausea And Vomiting    VITALS:  Blood pressure (!) 97/57, pulse 96, temperature 98.1 F (36.7 C), temperature source Oral, resp. rate 17, height 5' 6"  (1.676 m), weight 209 lb (94.8 kg), SpO2 97 %.  PHYSICAL EXAMINATION:   GENERAL:  61 y.o.-year-old patient lying in the bed with no acute distress.  EYES: Pupils equal, round, reactive to light and accommodation. No scleral icterus. Extraocular muscles intact.  HEENT: Head atraumatic, normocephalic. Oropharynx and nasopharynx clear.  NECK:  Supple, no jugular venous distention. No thyroid enlargement, no tenderness.  LUNGS: Normal  breath sounds bilaterally, no wheezing,  or crepitation. No use of accessory muscles of respiration.  CARDIOVASCULAR: S1, S2 normal. No murmurs, rubs, or gallops.  ABDOMEN: Soft, nontender, nondistended. Bowel sounds present. No organomegaly or mass.  EXTREMITIES: No pedal edema, cyanosis, or clubbing.  NEUROLOGIC: right pupil fix, left pupil responsive to light. Off sedation and follows  Commands.power 5/5 all limbs, gait not checked. PSYCHIATRIC: The patient is alert and oriented.[name] Kitchen  SKIN: No obvious rash, lesion, or ulcer.   Physical Exam LABORATORY PANEL:   CBC Recent Labs  Lab 12/23/16 0457  WBC 8.1  HGB 10.9*  HCT 32.9*  PLT 103*   ------------------------------------------------------------------------------------------------------------------  Chemistries  Recent Labs  Lab 12/18/16 0849  12/18/16 2327  12/23/16 0457  NA 134*   < > 138   < > 135  K 2.6*   < > 4.1   < > 4.5  CL 102   < > 112*   < > 107  CO2 17*   < > 20*   < > 20*  GLUCOSE 518*   < > 102*   < > 198*  BUN 15   < > 19   < > 25*  CREATININE 1.27*   < > 1.85*   < > 1.99*  CALCIUM 8.3*   < > 7.7*   < > 8.5*  MG  --   --  1.3*  --   --   AST 24  --   --   --   --   ALT 10*  --   --   --   --   ALKPHOS 106  --   --   --   --  BILITOT 0.4  --   --   --   --    < > = values in this interval not displayed.   ------------------------------------------------------------------------------------------------------------------  Cardiac Enzymes Recent Labs  Lab 12/19/16 0435 12/19/16 1039  TROPONINI 17.52* 18.21*   ------------------------------------------------------------------------------------------------------------------  RADIOLOGY:  No results found.  ASSESSMENT AND PLAN:   Principal Problem:   Acute respiratory failure with hypoxia (HCC) Active Problems:   PNA (pneumonia)   CHF exacerbation (HCC)   Acute respiratory failure (HCC)   Sepsis (HCC)   Coffee ground emesis   NSTEMI (non-ST  elevated myocardial infarction) ([place])   *Acute respiratory failure now resolved, felt to be due to multifactorial reasons  Due to Ac Diastolic CHF exacerbation-treated with Lasix currently diuresis on hold due to acute renal failure   Pneumonia and sepsis- suspected on admission, but ruled out.  Not on any antibiotics    * NSTEMI Status post cardiac cath with three-vessel disease as per cardiology note will need to consider possible CABG Continue aspirin therapy Once patient renal function and pulmonary disease patient stable could consider possible CABG in the near future   * GI bleed due to upper GI bleed     On EGD- found to have gastric ulcer, no active bleed-      Hemoglobin stable continue Protonix check a CBC in the morning      * Ac on ch renal failure- stage 3 That is post cardiac cath repeat BMP in the morning  * DM with uncontrolled blood sugar.   Continue Lantus and sliding scale insulin  * COPD    Does not have active wheeze.    on duoneb.   All the records are reviewed and case discussed with Care Management/Social Workerr. Management plans discussed with the patient, family and they are in agreement.  CODE STATUS: full.  TOTAL TIME TAKING CARE OF THIS PATIENT: 25 minutes.    POSSIBLE D/C IN 1-2 DAYS, DEPENDING ON CLINICAL CONDITION.   Dustin Flock M.D on 12/23/2016   Between 7am to 6pm - Pager - [phone]  After 6pm go to [email or web address] - password [email or web address] [facility]  Office  [phone]  CC: Primary care physician; [facility]  Note: This dictation was prepared with Dragon dictation along with smaller phrase technology. Any transcriptional errors that result from this process are unintentional.

## 2016-12-23 NOTE — Progress Notes (Signed)
       [facility]  SUBJECTIVE: No further chest pain   Vitals:   12/21/16 2111 12/22/16 0544 12/22/16 2033 12/23/16 0438  BP:  (!) 150/85 138/83 133/73  Pulse: (!) 101 98 96 (!) 102  Resp: 16 20 20 18   Temp:  97.9 F (36.6 C) 98.6 F (37 C) 98.6 F (37 C)  TempSrc:  Oral Oral Oral  SpO2: 95% 94% 100% 94%  Weight:      Height:        Intake/Output Summary (Last 24 hours) at 12/23/2016 0958 Last data filed at 12/23/2016 0948 Gross per 24 hour  Intake 1044 ml  Output -  Net 1044 ml    LABS: Basic Metabolic Panel: Recent Labs    12/22/16 0444 12/23/16 0457  NA 135 135  K 4.3 4.5  CL 108 107  CO2 19* 20*  GLUCOSE 187* 198*  BUN 24* 25*  CREATININE 2.33* 1.99*  CALCIUM 8.6* 8.5*   Liver Function Tests: No results for input(s): AST, ALT, ALKPHOS, BILITOT, PROT, ALBUMIN in the last 72 hours. No results for input(s): LIPASE, AMYLASE in the last 72 hours. CBC: Recent Labs    12/22/16 0444 12/23/16 0457  WBC 8.9 8.1  HGB 10.4* 10.9*  HCT 32.2* 32.9*  MCV 84.2 84.3  PLT 107* 103*   Cardiac Enzymes: No results for input(s): CKTOTAL, CKMB, CKMBINDEX, TROPONINI in the last 72 hours. BNP: Invalid input(s): POCBNP D-Dimer: No results for input(s): DDIMER in the last 72 hours. Hemoglobin A1C: No results for input(s): HGBA1C in the last 72 hours. Fasting Lipid Panel: No results for input(s): CHOL, HDL, LDLCALC, TRIG, CHOLHDL, LDLDIRECT in the last 72 hours. Thyroid Function Tests: No results for input(s): TSH, T4TOTAL, T3FREE, THYROIDAB in the last 72 hours.  Invalid input(s): FREET3 Anemia Panel: No results for input(s): VITAMINB12, FOLATE, FERRITIN, TIBC, IRON, RETICCTPCT in the last 72 hours.   Physical Exam: Blood pressure 133/73, pulse (!) 102, temperature 98.6 F (37 C), temperature source Oral, resp. rate 18, height 5' 9"  (1.753 m), weight 95.2 kg (209 lb 14.1 oz), SpO2 94 %.   Wt Readings from Last 1  Encounters:  12/18/16 95.2 kg (209 lb 14.1 oz)     General appearance: alert and cooperative Resp: clear to auscultation bilaterally Cardio: regular rate and rhythm GI: soft, non-tender; bowel sounds normal; no masses,  no organomegaly Pulses: 2+ and symmetric Neurologic: Grossly normal  TELEMETRY: Reviewed telemetry pt in normal sinus rhythm:  ASSESSMENT AND PLAN:  Principal Problem:   Acute respiratory failure with hypoxia (HCC) Active Problems:   PNA (pneumonia)   CHF exacerbation (HCC)   Acute respiratory failure (HCC)   Sepsis (HCC)   Coffee ground emesis and no further emesis.   NSTEMI (non-ST elevated myocardial infarction) ([place]) I FEN patient ruled in for non-ST elevation myocardial infarction. This may be demand ischemia versus coronary disease. Cardiac catheterization was initially deferred due to gastric bleeding is well as renal dysfunction. There is been no further gastric bleeding. Renal function is improved but does not return to baseline. Long discussion with the patient and her multiple family members. We'll proceed with left heart catheter with limited contrast use to determine anatomy. Further recognitions after this is completed.    Teodoro Spray, MD, [facility] 12/23/2016 9:58 AM

## 2016-12-23 NOTE — Progress Notes (Signed)
ANTICOAGULATION CONSULT NOTE - Follow Up Consult  Pharmacy Consult for heparin Indication: chest pain/ACS  Allergies  Allergen Reactions  . Latex Anaphylaxis and Rash  . Gabapentin Nausea And Vomiting    Patient Measurements: Height: 5' 9"  (175.3 cm) Weight: 209 lb 14.1 oz (95.2 kg) IBW/kg (Calculated) : 66.2 Heparin Dosing Weight: 90 kg  Vital Signs: Temp: 98.6 F (37 C) (12/07 0438) Temp Source: Oral (12/07 0438) BP: 133/73 (12/07 0438) Pulse Rate: 102 (12/07 0438)  Labs: Recent Labs    12/21/16 0217 12/21/16 0755 12/22/16 0444 12/23/16 0457  HGB 10.8*  --  10.4* 10.9*  HCT 32.9*  --  32.2* 32.9*  PLT 102*  --  107* 103*  HEPARINUNFRC 0.50 0.37 0.35 0.19*  CREATININE 2.58*  --  2.33* 1.99*    Estimated Creatinine Clearance: 36 mL/min (A) (by C-G formula based on SCr of 1.99 mg/dL (H)).   Medical History: Past Medical History:  Diagnosis Date  . CHF (congestive heart failure) ([place])   . COPD (chronic obstructive pulmonary disease) ([place])   . Diabetes mellitus without complication (HCC)     Medications:  Infusions:  . sodium chloride 10 mL/hr at 12/21/16 0855  . heparin 1,400 Units/hr (12/23/16 0030)    Assessment: 54 yof cc respiratory distress, PMH CHF, COPD, DM, tobacco use. Troponin 0.04 - 0.85 -, ECG shows sinus tachycardia, LVH with secondary repolarization abnormality, and prolonged QT interval. Pharmacy consulted to dose UFH for ACS.   Goal of Therapy:  Heparin level 0.3-0.7 units/ml Monitor platelets by anticoagulation protocol: Yes   Plan:  Heparin stopped due to hematemesis. OK to resume heparin drip after EGD per GI. Will resume heparin without bolus at 1100 units/hr and check a HL in 6 hours.   12/4 @ 0130 HL 0.25 subtherapeutic. Of note aPTT 76, patient does not appear to be on any PTA anticoagulants per care everywhere. Will rebolus w/ heparin 1400 units IV x 1 and increase rate to 1250 units/hr, will recheck HL @ 1030 and CBC w/ am  labs.  12/4: Heparin level resulted @ 0.41. Will recheck Heparin level @ 1800  12/4 Heparin level low at 0.24. Confirmed with RN that drip had not been held. Bolus 1350 units and increase rate to 1400 units/hr. Recheck level in 6 hours  12/5 @ 0200 HL 0.50 therapeutic. Will continue current rate and will recheck HL @ 0800.  12/5 @ 0755 HL resulted @ 0.37 therapeutic. Will continue current rate and will recheck HL in am.   12/6 @ 0444 HL 0.35 therapeutic. Will continue current rate and will recheck HL and CBC w/ am labs.  12/7 @ 0500 HL 0.19 subtherapeutic. Will rebolus heparin 1400 units IV x 1 and will increase heparin rate 1600 units/hr and recheck HL @ 1100.  Tobie Lords, PharmD, BCPS Clinical Pharmacist 12/23/2016

## 2016-12-23 NOTE — Discharge Instructions (Addendum)
Heart Failure Clinic appointment on December 28 2016 at 11:00am with Darylene Price, [place]. Please call [phone] to reschedule.   [place] [facility] [place]:  Diabetic diet, cardiac diet  DISCHARGE CONDITION:  Stable  ACTIVITY:  Activity as tolerated  OXYGEN:  Home Oxygen: No.   Oxygen Delivery: room air  DISCHARGE LOCATION:  home    ADDITIONAL DISCHARGE INSTRUCTION: please watch your salt intake   If you experience worsening of your admission symptoms, develop shortness of breath, life threatening emergency, suicidal or homicidal thoughts you must seek medical attention immediately by calling 911 or calling your MD immediately  if symptoms less severe.  You Must read complete instructions/literature along with all the possible adverse reactions/side effects for all the Medicines you take and that have been prescribed to you. Take any new Medicines after you have completely understood and accpet all the possible adverse reactions/side effects.   Please note  You were cared for by a hospitalist during your hospital stay. If you have any questions about your discharge medications or the care you received while you were in the hospital after you are discharged, you can call the unit and asked to speak with the hospitalist on call if the hospitalist that took care of you is not available. Once you are discharged, your primary care physician will handle any further medical issues. Please note that NO REFILLS for any discharge medications will be authorized once you are discharged, as it is imperative that you return to your primary care physician (or establish a relationship with a primary care physician if you do not have one) for your aftercare needs so that they can reassess your need for medications and monitor your lab values.  Check blood sugars twice daily in the morning and at bedtime

## 2016-12-24 LAB — BASIC METABOLIC PANEL
Anion gap: 6 (ref 5–15)
BUN: 26 mg/dL — AB (ref 6–20)
CHLORIDE: 108 mmol/L (ref 101–111)
CO2: 22 mmol/L (ref 22–32)
CREATININE: 2.05 mg/dL — AB (ref 0.44–1.00)
Calcium: 8.8 mg/dL — ABNORMAL LOW (ref 8.9–10.3)
GFR calc Af Amer: 29 mL/min — ABNORMAL LOW (ref >60)
GFR calc non Af Amer: 25 mL/min — ABNORMAL LOW (ref >60)
GLUCOSE: 184 mg/dL — AB (ref 65–99)
Potassium: 4.8 mmol/L (ref 3.5–5.1)
Sodium: 136 mmol/L (ref 135–145)

## 2016-12-24 LAB — CULTURE, BLOOD (ROUTINE X 2): Special Requests: ADEQUATE

## 2016-12-24 LAB — CBC
HCT: 32.8 % — ABNORMAL LOW (ref 35.0–47.0)
Hemoglobin: 10.8 g/dL — ABNORMAL LOW (ref 12.0–16.0)
MCH: 27.7 pg (ref 26.0–34.0)
MCHC: 32.9 g/dL (ref 32.0–36.0)
MCV: 84.1 fL (ref 80.0–100.0)
PLATELETS: 131 10*3/uL — AB (ref 150–440)
RBC: 3.9 MIL/uL (ref 3.80–5.20)
RDW: 14.8 % — AB (ref 11.5–14.5)
WBC: 8.1 10*3/uL (ref 3.6–11.0)

## 2016-12-24 LAB — GLUCOSE, CAPILLARY
GLUCOSE-CAPILLARY: 210 mg/dL — AB (ref 65–99)
Glucose-Capillary: 261 mg/dL — ABNORMAL HIGH (ref 65–99)
Glucose-Capillary: 242 mg/dL — ABNORMAL HIGH (ref 65–99)
Glucose-Capillary: 242 mg/dL — ABNORMAL HIGH (ref 65–99)

## 2016-12-24 MED ORDER — INSULIN GLARGINE 100 UNIT/ML ~~LOC~~ SOLN
15.0000 [IU] | Freq: Every day | SUBCUTANEOUS | Status: DC
Start: 2016-12-25 — End: 2016-12-26
  Administered 2016-12-25 – 2016-12-26 (×2): 15 [IU] via SUBCUTANEOUS
  Filled 2016-12-24 (×3): qty 0.15

## 2016-12-24 MED ORDER — ISOSORBIDE MONONITRATE ER 30 MG PO TB24: 30.0000 mg | ORAL_TABLET | Freq: Every day | ORAL | 0 refills | Status: DC

## 2016-12-24 MED ORDER — INSULIN GLARGINE 100 UNIT/ML SOLOSTAR PEN
15.0000 [IU] | PEN_INJECTOR | Freq: Every day | SUBCUTANEOUS | 0 refills | Status: DC
Start: 2016-12-24 — End: 2016-12-25

## 2016-12-24 MED ORDER — ISOSORBIDE MONONITRATE ER 30 MG PO TB24
30.0000 mg | ORAL_TABLET | Freq: Every day | ORAL | Status: DC
  Administered 2016-12-25 – 2016-12-26 (×2): 30 mg via ORAL
  Filled 2016-12-24 (×2): qty 1

## 2016-12-24 MED ORDER — INSULIN GLARGINE 100 UNIT/ML ~~LOC~~ SOLN
5.0000 [IU] | Freq: Once | SUBCUTANEOUS | Status: AC
Start: 2016-12-24 — End: 2016-12-24
  Administered 2016-12-24: 5 [IU] via SUBCUTANEOUS
  Filled 2016-12-24: qty 0.05

## 2016-12-24 MED ORDER — BLOOD GLUCOSE MONITOR KIT: PACK | 0 refills | Status: DC

## 2016-12-24 MED ORDER — INSULIN PEN NEEDLE 32G X 5 MM MISC: 1.0000 [IU] | Freq: Every day | 0 refills | Status: DC

## 2016-12-24 NOTE — Progress Notes (Signed)
[facility] [place] NAME: [name]    MR#:  [id number]  DATE OF BIRTH:  [birth year]  SUBJECTIVE:  CHIEF COMPLAINT:   Chief Complaint  Patient presents with  . Respiratory Distress   Not feeling well. Feels weak. Worse than yesterday  REVIEW OF SYSTEMS:    Review of Systems  Constitutional: Negative for chills, fever and malaise/fatigue.  HENT: Negative for congestion, ear discharge, ear pain, hearing loss and sore throat.   Eyes: Negative for blurred vision and double vision.  Respiratory: Negative for cough, sputum production and shortness of breath.   Cardiovascular: Negative for chest pain, palpitations, orthopnea and leg swelling.  Gastrointestinal: Negative for abdominal pain, diarrhea, nausea and vomiting.  Genitourinary: Negative for dysuria, frequency and hematuria.  Musculoskeletal: Negative for falls, joint pain and myalgias.  Skin: Negative for rash.  Neurological: Negative for dizziness, tingling, sensory change, focal weakness and seizures.  Psychiatric/Behavioral: Negative for depression and substance abuse.    DRUG ALLERGIES:   Allergies  Allergen Reactions  . Latex Anaphylaxis and Rash  . Gabapentin Nausea And Vomiting    VITALS:  Blood pressure 126/61, pulse 96, temperature 98.2 F (36.8 C), temperature source Oral, resp. rate 18, height 5' 6"  (1.676 m), weight 94.8 kg (209 lb), SpO2 97 %.  PHYSICAL EXAMINATION:   GENERAL:  62 y.o.-year-old patient lying in the bed with no acute distress.  EYES: Pupils equal, round, reactive to light and accommodation. No scleral icterus. Extraocular muscles intact.  HEENT: Head atraumatic, normocephalic. Oropharynx and nasopharynx clear.  NECK:  Supple, no jugular venous distention. No thyroid enlargement, no tenderness.  LUNGS: Normal breath sounds bilaterally, no wheezing,  or crepitation. No use of accessory muscles of respiration.  CARDIOVASCULAR: S1, S2 normal.  No murmurs, rubs, or gallops.  ABDOMEN: Soft, nontender, nondistended. Bowel sounds present. No organomegaly or mass.  EXTREMITIES: No pedal edema, cyanosis, or clubbing.  NEUROLOGIC: right pupil fix, left pupil responsive to light. Off sedation and follows  Commands.power 5/5 all limbs, gait not checked. PSYCHIATRIC: The patient is alert and oriented.[name] Kitchen  SKIN: No obvious rash, lesion, or ulcer.   Physical Exam LABORATORY PANEL:   CBC Recent Labs  Lab 12/24/16 0450  WBC 8.1  HGB 10.8*  HCT 32.8*  PLT 131*   ------------------------------------------------------------------------------------------------------------------  Chemistries  Recent Labs  Lab 12/18/16 0849  12/18/16 2327  12/24/16 0450  NA 134*   < > 138   < > 136  K 2.6*   < > 4.1   < > 4.8  CL 102   < > 112*   < > 108  CO2 17*   < > 20*   < > 22  GLUCOSE 518*   < > 102*   < > 184*  BUN 15   < > 19   < > 26*  CREATININE 1.27*   < > 1.85*   < > 2.05*  CALCIUM 8.3*   < > 7.7*   < > 8.8*  MG  --   --  1.3*  --   --   AST 24  --   --   --   --   ALT 10*  --   --   --   --   ALKPHOS 106  --   --   --   --   BILITOT 0.4  --   --   --   --    < > = values in  this interval not displayed.   ------------------------------------------------------------------------------------------------------------------  Cardiac Enzymes Recent Labs  Lab 12/19/16 0435 12/19/16 1039  TROPONINI 17.52* 18.21*   ------------------------------------------------------------------------------------------------------------------  RADIOLOGY:  No results found.  ASSESSMENT AND PLAN:   Principal Problem:   Acute respiratory failure with hypoxia (HCC) Active Problems:   PNA (pneumonia)   CHF exacerbation (HCC)   Acute respiratory failure (HCC)   Sepsis (HCC)   Coffee ground emesis   NSTEMI (non-ST elevated myocardial infarction) ([place])   *Acute respiratory failure now resolved, felt to be due to multifactorial reasons  Due to  Ac Diastolic CHF exacerbation-treated with Lasix currently diuresis on hold due to acute renal failure   Pneumonia and sepsis- suspected on admission, but ruled out.  Not on any antibiotics   * NSTEMI Status post cardiac cath with three-vessel disease as per cardiology note will need to consider possible CABG as OP Continue aspirin therapy ASA, Statin, BB  * GI bleed due to upper GI bleed     On EGD- found to have gastric ulcer, no active bleed-      Hemoglobin stable continue Protonix check a CBC in the morning      * AKI over CKD3 Cr stabilized at 2  * DM with uncontrolled blood sugar.   Continue Lantus and sliding scale insulin  * COPD    Does not have active wheeze.    on duoneb.   All the records are reviewed and case discussed with Care Management/Social Worker Management plans discussed with the patient, family and they are in agreement.  CODE STATUS: full.  TOTAL TIME TAKING CARE OF THIS PATIENT: 25 minutes.   POSSIBLE D/C IN 1-2 DAYS, DEPENDING ON CLINICAL CONDITION.  Neita Carp M.D on 12/24/2016   Between 7am to 6pm - Pager - [phone]  After 6pm go to [email or web address] - password [email or web address] [facility]  Office  [phone]  CC: Primary care physician; [facility]  Note: This dictation was prepared with Dragon dictation along with smaller phrase technology. Any transcriptional errors that result from this process are unintentional.

## 2016-12-25 LAB — GLUCOSE, CAPILLARY
GLUCOSE-CAPILLARY: 190 mg/dL — AB (ref 65–99)
Glucose-Capillary: 271 mg/dL — ABNORMAL HIGH (ref 65–99)
Glucose-Capillary: 179 mg/dL — ABNORMAL HIGH (ref 65–99)
Glucose-Capillary: 209 mg/dL — ABNORMAL HIGH (ref 65–99)

## 2016-12-25 MED ORDER — INSULIN GLARGINE 100 UNIT/ML SOLOSTAR PEN: 15.0000 [IU] | PEN_INJECTOR | Freq: Every day | SUBCUTANEOUS | 0 refills | Status: DC

## 2016-12-25 MED ORDER — ISOSORBIDE MONONITRATE ER 30 MG PO TB24: 30.0000 mg | ORAL_TABLET | Freq: Every day | ORAL | 0 refills | Status: DC

## 2016-12-25 NOTE — Progress Notes (Signed)
PT Cancellation Note  Patient Details Name: [name] MRN: [id number] DOB: [birth year]   Cancelled Treatment:    Reason Eval/Treat Not Completed: (Consult received.  Per discussion with attending physician on unit, patient with no acute PT needs.  Ok to discontinue order; will re-consult should needs change.)   [name], PT, DPT, NCS 12/25/16, 10:04 AM [phone]

## 2016-12-26 LAB — GLUCOSE, CAPILLARY
GLUCOSE-CAPILLARY: 159 mg/dL — AB (ref 65–99)
Glucose-Capillary: 163 mg/dL — ABNORMAL HIGH (ref 65–99)
Glucose-Capillary: 158 mg/dL — ABNORMAL HIGH (ref 65–99)
Glucose-Capillary: 241 mg/dL — ABNORMAL HIGH (ref 65–99)

## 2016-12-26 LAB — BASIC METABOLIC PANEL
ANION GAP: 10 (ref 5–15)
BUN: 22 mg/dL — AB (ref 6–20)
CALCIUM: 8.9 mg/dL (ref 8.9–10.3)
CHLORIDE: 103 mmol/L (ref 101–111)
CO2: 22 mmol/L (ref 22–32)
CREATININE: 1.86 mg/dL — AB (ref 0.44–1.00)
GFR calc non Af Amer: 28 mL/min — ABNORMAL LOW (ref >60)
GFR, EST AFRICAN AMERICAN: 32 mL/min — AB (ref >60)
Glucose, Bld: 223 mg/dL — ABNORMAL HIGH (ref 65–99)
Potassium: 4.5 mmol/L (ref 3.5–5.1)
SODIUM: 135 mmol/L (ref 135–145)

## 2016-12-26 MED ORDER — ATORVASTATIN CALCIUM 40 MG PO TABS: 40.0000 mg | ORAL_TABLET | Freq: Every day | ORAL | 3 refills | Status: DC

## 2016-12-26 NOTE — Discharge Summary (Signed)
[facility] [place] NAME: [name]    MR#:  [id number]  DATE OF BIRTH:  30-Jan-1955  DATE OF ADMISSION:  12/18/2016   ADMITTING PHYSICIAN: Vaughan Basta, MD  DATE OF DISCHARGE: 12/26/16  PRIMARY CARE PHYSICIAN: [facility]   ADMISSION DIAGNOSIS:   Acute respiratory failure with hypoxia ([place]) [J96.01] Sepsis ([place]) [A41.9]  DISCHARGE DIAGNOSIS:   Principal Problem:   Acute respiratory failure with hypoxia (HCC) Active Problems:   PNA (pneumonia)   CHF exacerbation (HCC)   Acute respiratory failure (HCC)   Sepsis ([place])   Coffee ground emesis   NSTEMI (non-ST elevated myocardial infarction) ([place])   SECONDARY DIAGNOSIS:   Past Medical History:  Diagnosis Date  . CHF (congestive heart failure) ([place])   . COPD (chronic obstructive pulmonary disease) ([place])   . Diabetes mellitus without complication [facility])     HOSPITAL COURSE:   61 year old female with poorly controlled diabetes mellitus, hypertension, CK D stage III with baseline creatinine of 1.28, chronic low back pain, COPD and sleep apnea not on home oxygen and history of hepatitis C, systolic CHF presents to hospital secondary to acute respiratory distress and noted to have upper GI bleed and also NSTEMI  1. NSTEMI- appreciate cardiac consult. Patient had a cardiac catheterization showing 90% proximal LAD stenosis, 80% proximal circumflex stenosis and 100% RCA stenosis with collaterals from left. -3 vessel CABG recommended. Will need outpatient cardiology follow up after discharge once renal function is recovered, will need bypass surgery - on asa, statin, coreg, imdur  2. Systolic CHF- well compensated, EF 45% - continue cardiac meds BP soft- so no ACEI or ARb at this time Can be started as outpatient  3. Uncontrolled DM- counseled - started on lantus -Past A1c's were greater than 10  4. ARF on CKD- baseline creatinine around 1.28,  improved to 1.8 at discharge Also had cardiac catheterization -Secondary to ATN due to hypotension on admission.  5. COPD- stable  6. Upper GI bleed- no EGD due to NSTEMI No active bleeding, hb stable Also on aspirin now Continue PPI at discharge  Discharge today   DISCHARGE CONDITIONS:   Guarded  CONSULTS OBTAINED:   Treatment Team:  Teodoro Spray, MD Murlean Iba, MD Samara Deist, DPM  DRUG ALLERGIES:   Allergies  Allergen Reactions  . Latex Anaphylaxis and Rash  . Gabapentin Nausea And Vomiting   DISCHARGE MEDICATIONS:   Allergies as of 12/26/2016      Reactions   Latex Anaphylaxis, Rash   Gabapentin Nausea And Vomiting      Medication List    TAKE these medications   aspirin 325 MG tablet Take 1 tablet (325 mg total) by mouth daily.   atorvastatin 40 MG tablet Commonly known as:  LIPITOR Take 1 tablet (40 mg total) by mouth daily.   blood glucose meter kit and supplies Kit Dispense based on patient and insurance preference. Use up to four times daily as directed. (FOR ICD-9 250.00, 250.01).   carvedilol 3.125 MG tablet Commonly known as:  COREG Take 1 tablet (3.125 mg total) by mouth 2 (two) times daily with a meal.   Insulin Glargine 100 UNIT/ML Solostar Pen Commonly known as:  LANTUS SOLOSTAR Inject 15 Units into the skin daily.   Insulin Pen Needle 32G X 5 MM Misc 1 Units by Does not apply route daily.   isosorbide mononitrate 30 MG 24 hr tablet Commonly known as:  IMDUR Take 1  tablet (30 mg total) by mouth daily.   pantoprazole 40 MG tablet Commonly known as:  PROTONIX Take 1 tablet (40 mg total) by mouth 2 (two) times daily before a meal.   pregabalin 100 MG capsule Commonly known as:  LYRICA Take 100 mg by mouth 3 (three) times daily.        DISCHARGE INSTRUCTIONS:   1. Cardiology f/u in 1-2 weeks 2. Nephrology f/u in 2 weeks 3. PCP f/u in 1-2 weeks  DIET:   Cardiac diet and diabetic diet  ACTIVITY:   Activity  as tolerated  OXYGEN:   Home Oxygen: No.  Oxygen Delivery: room air  DISCHARGE LOCATION:   home   If you experience worsening of your admission symptoms, develop shortness of breath, life threatening emergency, suicidal or homicidal thoughts you must seek medical attention immediately by calling 911 or calling your MD immediately  if symptoms less severe.  You Must read complete instructions/literature along with all the possible adverse reactions/side effects for all the Medicines you take and that have been prescribed to you. Take any new Medicines after you have completely understood and accpet all the possible adverse reactions/side effects.   Please note  You were cared for by a hospitalist during your hospital stay. If you have any questions about your discharge medications or the care you received while you were in the hospital after you are discharged, you can call the unit and asked to speak with the hospitalist on call if the hospitalist that took care of you is not available. Once you are discharged, your primary care physician will handle any further medical issues. Please note that NO REFILLS for any discharge medications will be authorized once you are discharged, as it is imperative that you return to your primary care physician (or establish a relationship with a primary care physician if you do not have one) for your aftercare needs so that they can reassess your need for medications and monitor your lab values.    On the day of Discharge:  VITAL SIGNS:   Blood pressure 129/61, pulse 94, temperature 98.2 F (36.8 C), temperature source Oral, resp. rate 18, height _0  (1.676 m), weight 94.8 kg (209 lb), SpO2 98 %.  PHYSICAL EXAMINATION:    GENERAL:  61 y.o.-year-old patient lying in the bed with no acute distress.  EYES: Pupils equal, round, reactive to light and accommodation. No scleral icterus. Extraocular muscles intact.  HEENT: Head atraumatic, normocephalic.  Oropharynx and nasopharynx clear.  NECK:  Supple, no jugular venous distention. No thyroid enlargement, no tenderness.  LUNGS: Normal breath sounds bilaterally, no wheezing, rales,rhonchi or crepitation. No use of accessory muscles of respiration. Decreased basilar breath sounds CARDIOVASCULAR: S1, S2 normal. No  rubs, or gallops. 3/6 systolic murmur ABDOMEN: Soft, non-tender, non-distended. Bowel sounds present. No organomegaly or mass.  EXTREMITIES: No pedal edema, cyanosis, or clubbing.  NEUROLOGIC: Cranial nerves II through XII are intact. Muscle strength 5/5 in all extremities. Sensation intact. Gait not checked.  PSYCHIATRIC: The patient is alert and oriented x 3.  SKIN: No obvious rash, lesion, or ulcer.   DATA REVIEW:   CBC Recent Labs  Lab 12/24/16 0450  WBC 8.1  HGB 10.8*  HCT 32.8*  PLT 131*    Chemistries  Recent Labs  Lab 12/26/16 0849  NA 135  K 4.5  CL 103  CO2 22  GLUCOSE 223*  BUN 22*  CREATININE 1.86*  CALCIUM 8.9     Microbiology Results  Results  for orders placed or performed during the hospital encounter of 12/18/16  Culture, blood (routine x 2)     Status: Abnormal   Collection Time: 12/18/16  8:49 AM  Result Value Ref Range Status   Specimen Description BLOOD RIGHT ANTECUBITAL  Final   Special Requests   Final    BOTTLES DRAWN AEROBIC AND ANAEROBIC Blood Culture adequate volume   Culture  Setup Time   Final    GRAM POSITIVE COCCI ANAEROBIC BOTTLE ONLY CRITICAL RESULT CALLED TO, READ BACK BY AND VERIFIED WITH: MATT MCBANE AT [place] ON 12/19/16 [place].    Culture (A)  Final    VIRIDANS STREPTOCOCCUS THE SIGNIFICANCE OF ISOLATING THIS ORGANISM FROM A SINGLE SET OF BLOOD CULTURES WHEN MULTIPLE SETS ARE DRAWN IS UNCERTAIN. PLEASE NOTIFY THE MICROBIOLOGY DEPARTMENT WITHIN ONE WEEK IF SPECIATION AND SENSITIVITIES ARE REQUIRED. Performed at [facility], [place] [address] [place], [place] [address]    Report Status 12/21/2016 FINAL  Final    Culture, blood (routine x 2)     Status: Abnormal   Collection Time: 12/18/16  8:49 AM  Result Value Ref Range Status   Specimen Description BLOOD LEFT ANTECUBITAL  Final   Special Requests   Final    BOTTLES DRAWN AEROBIC AND ANAEROBIC Blood Culture adequate volume   Culture  Setup Time   Final    GRAM NEGATIVE RODS ANAEROBIC BOTTLE ONLY CRITICAL RESULT CALLED TO, READ BACK BY AND VERIFIED WITH: JASON ROBBINS 12/21/16 @ 2057  [place]    Culture (A)  Final    FUSOBACTERIUM NUCLEATUM BETA LACTAMASE NEGATIVE Performed at [facility], [place] [address], [place], [place] [address]    Report Status 12/24/2016 FINAL  Final  Blood Culture ID Panel (Reflexed)     Status: Abnormal   Collection Time: 12/18/16  8:49 AM  Result Value Ref Range Status   Enterococcus species NOT DETECTED NOT DETECTED Final   Listeria monocytogenes NOT DETECTED NOT DETECTED Final   Staphylococcus species NOT DETECTED NOT DETECTED Final   Staphylococcus aureus NOT DETECTED NOT DETECTED Final   Streptococcus species DETECTED (A) NOT DETECTED Final    Comment: Not Enterococcus species, Streptococcus agalactiae, Streptococcus pyogenes, or Streptococcus pneumoniae. CRITICAL RESULT CALLED TO, READ BACK BY AND VERIFIED WITH: MATT MCBANE AT 0701 ON 12/19/16 [place].    Streptococcus agalactiae NOT DETECTED NOT DETECTED Final   Streptococcus pneumoniae NOT DETECTED NOT DETECTED Final   Streptococcus pyogenes NOT DETECTED NOT DETECTED Final   Acinetobacter baumannii NOT DETECTED NOT DETECTED Final   Enterobacteriaceae species NOT DETECTED NOT DETECTED Final   Enterobacter cloacae complex NOT DETECTED NOT DETECTED Final   Escherichia coli NOT DETECTED NOT DETECTED Final   Klebsiella oxytoca NOT DETECTED NOT DETECTED Final   Klebsiella pneumoniae NOT DETECTED NOT DETECTED Final   Proteus species NOT DETECTED NOT DETECTED Final   Serratia marcescens NOT DETECTED NOT DETECTED Final   Haemophilus influenzae NOT DETECTED NOT  DETECTED Final   Neisseria meningitidis NOT DETECTED NOT DETECTED Final   Pseudomonas aeruginosa NOT DETECTED NOT DETECTED Final   Candida albicans NOT DETECTED NOT DETECTED Final   Candida glabrata NOT DETECTED NOT DETECTED Final   Candida krusei NOT DETECTED NOT DETECTED Final   Candida parapsilosis NOT DETECTED NOT DETECTED Final   Candida tropicalis NOT DETECTED NOT DETECTED Final  Blood Culture ID Panel (Reflexed)     Status: None   Collection Time: 12/18/16  8:49 AM  Result Value Ref Range Status  Enterococcus species NOT DETECTED NOT DETECTED Final   Listeria monocytogenes NOT DETECTED NOT DETECTED Final   Staphylococcus species NOT DETECTED NOT DETECTED Final   Staphylococcus aureus NOT DETECTED NOT DETECTED Final   Streptococcus species NOT DETECTED NOT DETECTED Final   Streptococcus agalactiae NOT DETECTED NOT DETECTED Final   Streptococcus pneumoniae NOT DETECTED NOT DETECTED Final   Streptococcus pyogenes NOT DETECTED NOT DETECTED Final   Acinetobacter baumannii NOT DETECTED NOT DETECTED Final   Enterobacteriaceae species NOT DETECTED NOT DETECTED Final   Enterobacter cloacae complex NOT DETECTED NOT DETECTED Final   Escherichia coli NOT DETECTED NOT DETECTED Final   Klebsiella oxytoca NOT DETECTED NOT DETECTED Final   Klebsiella pneumoniae NOT DETECTED NOT DETECTED Final   Proteus species NOT DETECTED NOT DETECTED Final   Serratia marcescens NOT DETECTED NOT DETECTED Final   Haemophilus influenzae NOT DETECTED NOT DETECTED Final   Neisseria meningitidis NOT DETECTED NOT DETECTED Final   Pseudomonas aeruginosa NOT DETECTED NOT DETECTED Final   Candida albicans NOT DETECTED NOT DETECTED Final   Candida glabrata NOT DETECTED NOT DETECTED Final   Candida krusei NOT DETECTED NOT DETECTED Final   Candida parapsilosis NOT DETECTED NOT DETECTED Final   Candida tropicalis NOT DETECTED NOT DETECTED Final  Culture, respiratory (NON-Expectorated)     Status: None   Collection  Time: 12/18/16  3:01 PM  Result Value Ref Range Status   Specimen Description TRACHEAL ASPIRATE  Final   Special Requests NONE  Final   Gram Stain   Final    FEW WBC PRESENT, PREDOMINANTLY PMN NO ORGANISMS SEEN    Culture   Final    NO GROWTH 2 DAYS Performed at [facility], [address], [place], [place] [address]    Report Status 12/21/2016 FINAL  Final  MRSA PCR Screening     Status: Abnormal   Collection Time: 12/18/16  4:38 PM  Result Value Ref Range Status   MRSA by PCR POSITIVE (A) NEGATIVE Final    Comment:        The GeneXpert MRSA Assay (FDA approved for NASAL specimens only), is one component of a comprehensive MRSA colonization surveillance program. It is not intended to diagnose MRSA infection nor to guide or monitor treatment for MRSA infections. RESULT CALLED TO, READ BACK BY AND VERIFIED WITH: BARBARA ALLEN 12/18/16 AT 1800 BY HS     RADIOLOGY:  No results found.   Management plans discussed with the patient, family and they are in agreement.  CODE STATUS:     Code Status Orders  (From admission, onward)        Start     Ordered   12/18/16 1147  Full code  Continuous     12/18/16 1146    Code Status History    Date Active Date Inactive Code Status Order ID Comments User Context   12/18/2016 09:59 12/18/2016 11:46 Full Code [id number]  Vaughan Basta, MD ED   12/04/2015 05:09 12/07/2015 17:56 Full Code [id number]  Holley Raring, NP ED   10/28/2015 16:40 10/29/2015 17:11 Full Code [id number]  Bettey Costa, MD Inpatient   08/29/2015 14:38 08/30/2015 17:50 Full Code [id number]  [place], Ubaldo Glassing, DO Inpatient      TOTAL TIME TAKING CARE OF THIS PATIENT: 38 minutes.    Laurelle Skiver M.D on 12/26/2016 at 2:39 PM  Between 7am to 6pm - Pager - [phone]  After 6pm go to [email or web address] - Proofreader  [employer]  [phone]  CC: Primary care physician; [place] [place]  [facility]   Note: This dictation was prepared with Dragon dictation along with smaller phrase technology. Any transcriptional errors that result from this process are unintentional.

## 2016-12-26 NOTE — Care Management (Signed)
RNCM confirmed with bedside RN that patient remains on RA, and there are not home RN or PT needs identified.

## 2016-12-26 NOTE — Progress Notes (Signed)
[facility], [place] 12/26/16  Subjective:  Patient continues to do well at the moment. Creatinine down to 1.86. Patient is in good spirits today.   Objective:  Vital signs in last 24 hours:  Temp:  [98.2 F (36.8 C)-98.6 F (37 C)] 98.2 F (36.8 C) (12/10 0519) Pulse Rate:  [94-95] 94 (12/10 0519) Resp:  [18] 18 (12/10 0519) BP: (110-129)/(55-61) 129/61 (12/10 0519) SpO2:  [97 %-98 %] 98 % (12/10 0519)  Weight change:  Filed Weights   12/18/16 0908 12/18/16 1141 12/23/16 1049  Weight: 107 kg (236 lb) 95.2 kg (209 lb 14.1 oz) 94.8 kg (209 lb)    Intake/Output:    Intake/Output Summary (Last 24 hours) at 12/26/2016 1228 Last data filed at 12/25/2016 1712 Gross per 24 hour  Intake -  Output 0 ml  Net 0 ml     Physical Exam: General:  Sitting up in the bed,   HEENT  moist oral mucous membranes  Neck  supple  Pulm/lungs  normal breathing effort, CTAB  CVS/Heart   soft systolic murmur, no rub  Abdomen:   Soft, nontender, nondistended  Extremities:  Trace to 1+ pitting edema  Neurologic:  Alert, oriented x3, conversant  Skin:  No acute rashes          Basic Metabolic Panel:  Recent Labs  Lab 12/21/16 0217 12/22/16 0444 12/23/16 0457 12/24/16 0450 12/26/16 0849  NA 137 135 135 136 135  K 4.8 4.3 4.5 4.8 4.5  CL 112* 108 107 108 103  CO2 17* 19* 20* 22 22  GLUCOSE 187* 187* 198* 184* 223*  BUN 21* 24* 25* 26* 22*  CREATININE 2.58* 2.33* 1.99* 2.05* 1.86*  CALCIUM 8.8* 8.6* 8.5* 8.8* 8.9     CBC: Recent Labs  Lab 12/20/16 0417 12/21/16 0217 12/22/16 0444 12/23/16 0457 12/24/16 0450  WBC 11.4* 11.0 8.9 8.1 8.1  HGB 10.9* 10.8* 10.4* 10.9* 10.8*  HCT 34.0* 32.9* 32.2* 32.9* 32.8*  MCV 84.4 83.8 84.2 84.3 84.1  PLT 99* 102* 107* 103* 131*     No results found for: HEPBSAG, HEPBSAB, HEPBIGM    Microbiology:  Recent Results (from the past 240 hour(s))  Culture, blood (routine x 2)     Status: Abnormal   Collection Time: 12/18/16  8:49 AM  Result Value Ref Range Status   Specimen Description BLOOD RIGHT ANTECUBITAL  Final   Special Requests   Final    BOTTLES DRAWN AEROBIC AND ANAEROBIC Blood Culture adequate volume   Culture  Setup Time   Final    GRAM POSITIVE COCCI ANAEROBIC BOTTLE ONLY CRITICAL RESULT CALLED TO, READ BACK BY AND VERIFIED WITH: MATT MCBANE AT [place] ON 12/19/16 [place].    Culture (A)  Final    VIRIDANS STREPTOCOCCUS THE SIGNIFICANCE OF ISOLATING THIS ORGANISM FROM A SINGLE SET OF BLOOD CULTURES WHEN MULTIPLE SETS ARE DRAWN IS UNCERTAIN. PLEASE NOTIFY THE MICROBIOLOGY DEPARTMENT WITHIN ONE WEEK IF SPECIATION AND SENSITIVITIES ARE REQUIRED. Performed at [facility], [place] [address], [place], [place] [address]    Report Status 12/21/2016 FINAL  Final  Culture, blood (routine x 2)     Status: Abnormal   Collection Time: 12/18/16  8:49 AM  Result Value Ref Range Status   Specimen Description BLOOD LEFT ANTECUBITAL  Final   Special Requests   Final    BOTTLES DRAWN AEROBIC AND ANAEROBIC Blood Culture adequate volume   Culture  Setup Time   Final    GRAM  NEGATIVE RODS ANAEROBIC BOTTLE ONLY CRITICAL RESULT CALLED TO, READ BACK BY AND VERIFIED WITH: JASON ROBBINS 12/21/16 @ 2057  [place]    Culture (A)  Final    FUSOBACTERIUM NUCLEATUM BETA LACTAMASE NEGATIVE Performed at [facility], [place] [address], [place], [place] [address]    Report Status 12/24/2016 FINAL  Final  Blood Culture ID Panel (Reflexed)     Status: Abnormal   Collection Time: 12/18/16  8:49 AM  Result Value Ref Range Status   Enterococcus species NOT DETECTED NOT DETECTED Final   Listeria monocytogenes NOT DETECTED NOT DETECTED Final   Staphylococcus species NOT DETECTED NOT DETECTED Final   Staphylococcus aureus NOT DETECTED NOT DETECTED Final   Streptococcus species DETECTED (A) NOT DETECTED Final    Comment: Not Enterococcus species, Streptococcus agalactiae, Streptococcus pyogenes, or  Streptococcus pneumoniae. CRITICAL RESULT CALLED TO, READ BACK BY AND VERIFIED WITH: MATT MCBANE AT 0701 ON 12/19/16 [place].    Streptococcus agalactiae NOT DETECTED NOT DETECTED Final   Streptococcus pneumoniae NOT DETECTED NOT DETECTED Final   Streptococcus pyogenes NOT DETECTED NOT DETECTED Final   Acinetobacter baumannii NOT DETECTED NOT DETECTED Final   Enterobacteriaceae species NOT DETECTED NOT DETECTED Final   Enterobacter cloacae complex NOT DETECTED NOT DETECTED Final   Escherichia coli NOT DETECTED NOT DETECTED Final   Klebsiella oxytoca NOT DETECTED NOT DETECTED Final   Klebsiella pneumoniae NOT DETECTED NOT DETECTED Final   Proteus species NOT DETECTED NOT DETECTED Final   Serratia marcescens NOT DETECTED NOT DETECTED Final   Haemophilus influenzae NOT DETECTED NOT DETECTED Final   Neisseria meningitidis NOT DETECTED NOT DETECTED Final   Pseudomonas aeruginosa NOT DETECTED NOT DETECTED Final   Candida albicans NOT DETECTED NOT DETECTED Final   Candida glabrata NOT DETECTED NOT DETECTED Final   Candida krusei NOT DETECTED NOT DETECTED Final   Candida parapsilosis NOT DETECTED NOT DETECTED Final   Candida tropicalis NOT DETECTED NOT DETECTED Final  Blood Culture ID Panel (Reflexed)     Status: None   Collection Time: 12/18/16  8:49 AM  Result Value Ref Range Status   Enterococcus species NOT DETECTED NOT DETECTED Final   Listeria monocytogenes NOT DETECTED NOT DETECTED Final   Staphylococcus species NOT DETECTED NOT DETECTED Final   Staphylococcus aureus NOT DETECTED NOT DETECTED Final   Streptococcus species NOT DETECTED NOT DETECTED Final   Streptococcus agalactiae NOT DETECTED NOT DETECTED Final   Streptococcus pneumoniae NOT DETECTED NOT DETECTED Final   Streptococcus pyogenes NOT DETECTED NOT DETECTED Final   Acinetobacter baumannii NOT DETECTED NOT DETECTED Final   Enterobacteriaceae species NOT DETECTED NOT DETECTED Final   Enterobacter cloacae complex NOT DETECTED  NOT DETECTED Final   Escherichia coli NOT DETECTED NOT DETECTED Final   Klebsiella oxytoca NOT DETECTED NOT DETECTED Final   Klebsiella pneumoniae NOT DETECTED NOT DETECTED Final   Proteus species NOT DETECTED NOT DETECTED Final   Serratia marcescens NOT DETECTED NOT DETECTED Final   Haemophilus influenzae NOT DETECTED NOT DETECTED Final   Neisseria meningitidis NOT DETECTED NOT DETECTED Final   Pseudomonas aeruginosa NOT DETECTED NOT DETECTED Final   Candida albicans NOT DETECTED NOT DETECTED Final   Candida glabrata NOT DETECTED NOT DETECTED Final   Candida krusei NOT DETECTED NOT DETECTED Final   Candida parapsilosis NOT DETECTED NOT DETECTED Final   Candida tropicalis NOT DETECTED NOT DETECTED Final  Culture, respiratory (NON-Expectorated)     Status: None   Collection Time: 12/18/16  3:01 PM  Result Value  Ref Range Status   Specimen Description TRACHEAL ASPIRATE  Final   Special Requests NONE  Final   Gram Stain   Final    FEW WBC PRESENT, PREDOMINANTLY PMN NO ORGANISMS SEEN    Culture   Final    NO GROWTH 2 DAYS Performed at [facility], [address], [place], [place] [address]    Report Status 12/21/2016 FINAL  Final  MRSA PCR Screening     Status: Abnormal   Collection Time: 12/18/16  4:38 PM  Result Value Ref Range Status   MRSA by PCR POSITIVE (A) NEGATIVE Final    Comment:        The GeneXpert MRSA Assay (FDA approved for NASAL specimens only), is one component of a comprehensive MRSA colonization surveillance program. It is not intended to diagnose MRSA infection nor to guide or monitor treatment for MRSA infections. RESULT CALLED TO, READ BACK BY AND VERIFIED WITH: BARBARA ALLEN 12/18/16 AT 1800 BY HS     Coagulation Studies: No results for input(s): LABPROT, INR in the last 72 hours.  Urinalysis: No results for input(s): COLORURINE, LABSPEC, PHURINE, GLUCOSEU, HGBUR, BILIRUBINUR, KETONESUR, PROTEINUR, UROBILINOGEN, NITRITE, LEUKOCYTESUR in the  last 72 hours.  Invalid input(s): APPERANCEUR    Imaging: No results found.   Medications:   . sodium chloride     . aspirin  325 mg Oral Daily  . atorvastatin  80 mg Per Tube q1800  . carvedilol  3.125 mg Oral BID WC  . insulin aspart  0-15 Units Subcutaneous TID WC  . insulin aspart  0-5 Units Subcutaneous QHS  . insulin glargine  15 Units Subcutaneous Daily  . isosorbide mononitrate  30 mg Oral Daily  . pantoprazole  40 mg Oral BID AC  . pregabalin  100 mg Oral TID  . sodium chloride flush  3 mL Intravenous Q12H   sodium chloride, acetaminophen, docusate sodium, ipratropium-albuterol, ondansetron (ZOFRAN) IV, sodium chloride flush  Assessment/ Plan:  61 y.o. female with poorly controlled diabetes, severe hypertension, chronic kidney disease stage III, chronic low back pain followed by [facility] neurosurgery, COPD, obstructive sleep apnea, hyperlipidemia, history of hepatitis C in 2016, chronic congestive CHF with EF 40-45%, admitted to the hospital for acute respiratory distress acute STEMI, acute renal failure, acute respiratory failure, lactic acidosis and upper GI bleed due to gastric ulcers.  1.  Acute renal failure, nonoliguric Likely secondary to hypotension and volume depletion due to ATN.  Strep viridans bacteremia. Creatinine continues to improve and is down to 1.86.  She will need continued monitoring of her renal function as an outpatient.  2.  Chronic kidney disease stage III Baseline creatinine 1.28, GFR 51 on December 18, 2016 -We have recommended to the patient that she will need outpatient follow-up for underlying chronic kidney disease.  3.  Diabetes type 2 with CKD Poorly controlled Hemoglobin A1c of > 14 % in July 2018.  Prior to that hemoglobin A1c of 10.9% in November 2017 Patient states that she has not taken insulin in the last year.  -Dietary education provided  4.  Proteinuria SPEP/UPEP negative.  5.  Non-STEMI Followed by cardiology.  Cardiac  cath is contemplated.  Deferred until serum creatinine improves  6.  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

## 2016-12-26 NOTE — Progress Notes (Signed)
[name] to be D/C'd home per MD order.  Discussed prescriptions and follow up appointments with the patient. Prescriptions given to patient, medication list explained in detail. Pt verbalized understanding.  Allergies as of 12/26/2016      Reactions   Latex Anaphylaxis, Rash   Gabapentin Nausea And Vomiting      Medication List    TAKE these medications   aspirin 325 MG tablet Take 1 tablet (325 mg total) by mouth daily.   atorvastatin 40 MG tablet Commonly known as:  LIPITOR Take 1 tablet (40 mg total) by mouth daily.   blood glucose meter kit and supplies Kit Dispense based on patient and insurance preference. Use up to four times daily as directed. (FOR ICD-9 250.00, 250.01).   carvedilol 3.125 MG tablet Commonly known as:  COREG Take 1 tablet (3.125 mg total) by mouth 2 (two) times daily with a meal.   Insulin Glargine 100 UNIT/ML Solostar Pen Commonly known as:  LANTUS SOLOSTAR Inject 15 Units into the skin daily.   Insulin Pen Needle 32G X 5 MM Misc 1 Units by Does not apply route daily.   isosorbide mononitrate 30 MG 24 hr tablet Commonly known as:  IMDUR Take 1 tablet (30 mg total) by mouth daily.   pantoprazole 40 MG tablet Commonly known as:  PROTONIX Take 1 tablet (40 mg total) by mouth 2 (two) times daily before a meal.   pregabalin 100 MG capsule Commonly known as:  LYRICA Take 100 mg by mouth 3 (three) times daily.       Vitals:   12/25/16 2000 12/26/16 0519  BP: (!) 110/55 129/61  Pulse: 94 94  Resp: 18 18  Temp: 98.6 F (37 C) 98.2 F (36.8 C)  SpO2: 98% 98%    Skin clean, dry and intact without evidence of skin break down, no evidence of skin tears noted. IV catheter discontinued intact. Site without signs and symptoms of complications. Dressing and pressure applied. Pt denies pain at this time. No complaints noted.  An After Visit Summary was printed and given to the patient. Patient escorted via [place], and D/C home via private  auto.  [name] A

## 2016-12-26 NOTE — Progress Notes (Signed)
Inpatient Diabetes Program Recommendations  AACE/ADA: New Consensus Statement on Inpatient Glycemic Control (2015)  Target Ranges:  Prepandial:   less than 140 mg/dL      Peak postprandial:   less than 180 mg/dL (1-2 hours)      Critically ill patients:  140 - 180 mg/dL   Results for FAIZAH, KANDLER (MRN [id number]) as of 12/26/2016 10:53  Ref. Range 12/25/2016 08:07 12/25/2016 12:24 12/25/2016 16:55 12/25/2016 21:07  Glucose-Capillary Latest Ref Range: 65 - 99 mg/dL 190 (H) 271 (H) 179 (H) 209 (H)   Results for LIANDRA, MENDIA (MRN [id number]) as of 12/26/2016 10:53  Ref. Range 12/26/2016 07:27  Glucose-Capillary Latest Ref Range: 65 - 99 mg/dL 159 (H)    Home DM Meds: None in over 1 year (was taking U-500 concentrated insulin prior to self-stopping meds)  Current Insulin Orders: Lantus 15 units daily      Novolog Moderate Correction Scale/ SSI (0-15 units) TID AC + HS       MD- Please consider starting Novolog Meal Coverage:  Novolog 4 units TID with meals (hold if pt eats <50% of meal)       --Will follow patient during hospitalization--  Wyn Quaker RN, MSN, CDE Diabetes Coordinator Inpatient Glycemic Control Team Team Pager: [phone] (8a-5p)

## 2016-12-28 ENCOUNTER — Ambulatory Visit: Payer: Medicaid Other | Attending: Family | Admitting: Family

## 2016-12-28 ENCOUNTER — Encounter: Payer: Self-pay | Admitting: Family

## 2016-12-28 ENCOUNTER — Other Ambulatory Visit: Payer: Self-pay

## 2016-12-28 ENCOUNTER — Encounter: Payer: Medicaid Other | Admitting: Internal Medicine

## 2016-12-28 VITALS — BP 142/68 | HR 93 | Resp 18 | Ht 65.0 in | Wt 205.5 lb

## 2016-12-28 DIAGNOSIS — Z7982 Long term (current) use of aspirin: Secondary | ICD-10-CM | POA: Diagnosis not present

## 2016-12-28 DIAGNOSIS — Z794 Long term (current) use of insulin: Secondary | ICD-10-CM | POA: Diagnosis not present

## 2016-12-28 DIAGNOSIS — Z8249 Family history of ischemic heart disease and other diseases of the circulatory system: Secondary | ICD-10-CM | POA: Diagnosis not present

## 2016-12-28 DIAGNOSIS — Z9889 Other specified postprocedural states: Secondary | ICD-10-CM | POA: Diagnosis not present

## 2016-12-28 DIAGNOSIS — Z9049 Acquired absence of other specified parts of digestive tract: Secondary | ICD-10-CM | POA: Diagnosis not present

## 2016-12-28 DIAGNOSIS — F17210 Nicotine dependence, cigarettes, uncomplicated: Secondary | ICD-10-CM | POA: Diagnosis not present

## 2016-12-28 DIAGNOSIS — N289 Disorder of kidney and ureter, unspecified: Secondary | ICD-10-CM | POA: Diagnosis not present

## 2016-12-28 DIAGNOSIS — Z79899 Other long term (current) drug therapy: Secondary | ICD-10-CM | POA: Diagnosis not present

## 2016-12-28 DIAGNOSIS — I214 Non-ST elevation (NSTEMI) myocardial infarction: Secondary | ICD-10-CM | POA: Diagnosis not present

## 2016-12-28 DIAGNOSIS — I252 Old myocardial infarction: Secondary | ICD-10-CM | POA: Diagnosis not present

## 2016-12-28 DIAGNOSIS — N183 Chronic kidney disease, stage 3 (moderate): Secondary | ICD-10-CM

## 2016-12-28 DIAGNOSIS — Z951 Presence of aortocoronary bypass graft: Secondary | ICD-10-CM | POA: Insufficient documentation

## 2016-12-28 DIAGNOSIS — R42 Dizziness and giddiness: Secondary | ICD-10-CM | POA: Insufficient documentation

## 2016-12-28 DIAGNOSIS — I5022 Chronic systolic (congestive) heart failure: Secondary | ICD-10-CM | POA: Insufficient documentation

## 2016-12-28 DIAGNOSIS — Z91040 Latex allergy status: Secondary | ICD-10-CM | POA: Insufficient documentation

## 2016-12-28 DIAGNOSIS — J449 Chronic obstructive pulmonary disease, unspecified: Secondary | ICD-10-CM | POA: Insufficient documentation

## 2016-12-28 DIAGNOSIS — E119 Type 2 diabetes mellitus without complications: Secondary | ICD-10-CM | POA: Diagnosis not present

## 2016-12-28 DIAGNOSIS — Z23 Encounter for immunization: Secondary | ICD-10-CM | POA: Insufficient documentation

## 2016-12-28 DIAGNOSIS — Z888 Allergy status to other drugs, medicaments and biological substances status: Secondary | ICD-10-CM | POA: Diagnosis not present

## 2016-12-28 DIAGNOSIS — Z720 Tobacco use: Secondary | ICD-10-CM | POA: Insufficient documentation

## 2016-12-28 DIAGNOSIS — F419 Anxiety disorder, unspecified: Secondary | ICD-10-CM | POA: Insufficient documentation

## 2016-12-28 DIAGNOSIS — I2510 Atherosclerotic heart disease of native coronary artery without angina pectoris: Secondary | ICD-10-CM | POA: Diagnosis not present

## 2016-12-28 DIAGNOSIS — E1022 Type 1 diabetes mellitus with diabetic chronic kidney disease: Secondary | ICD-10-CM

## 2016-12-28 DIAGNOSIS — I509 Heart failure, unspecified: Secondary | ICD-10-CM | POA: Diagnosis not present

## 2016-12-28 LAB — GLUCOSE, CAPILLARY: GLUCOSE-CAPILLARY: 179 mg/dL — AB (ref 65–99)

## 2016-12-28 IMAGING — DX DG CHEST 1V PORT
1 series · 1 of 1 positions shown · non-contrast
Comparison: 10/28/2015

CLINICAL DATA: Acute onset dyspnea this morning

EXAM:
PORTABLE CHEST 1 VIEW

[chest ap]
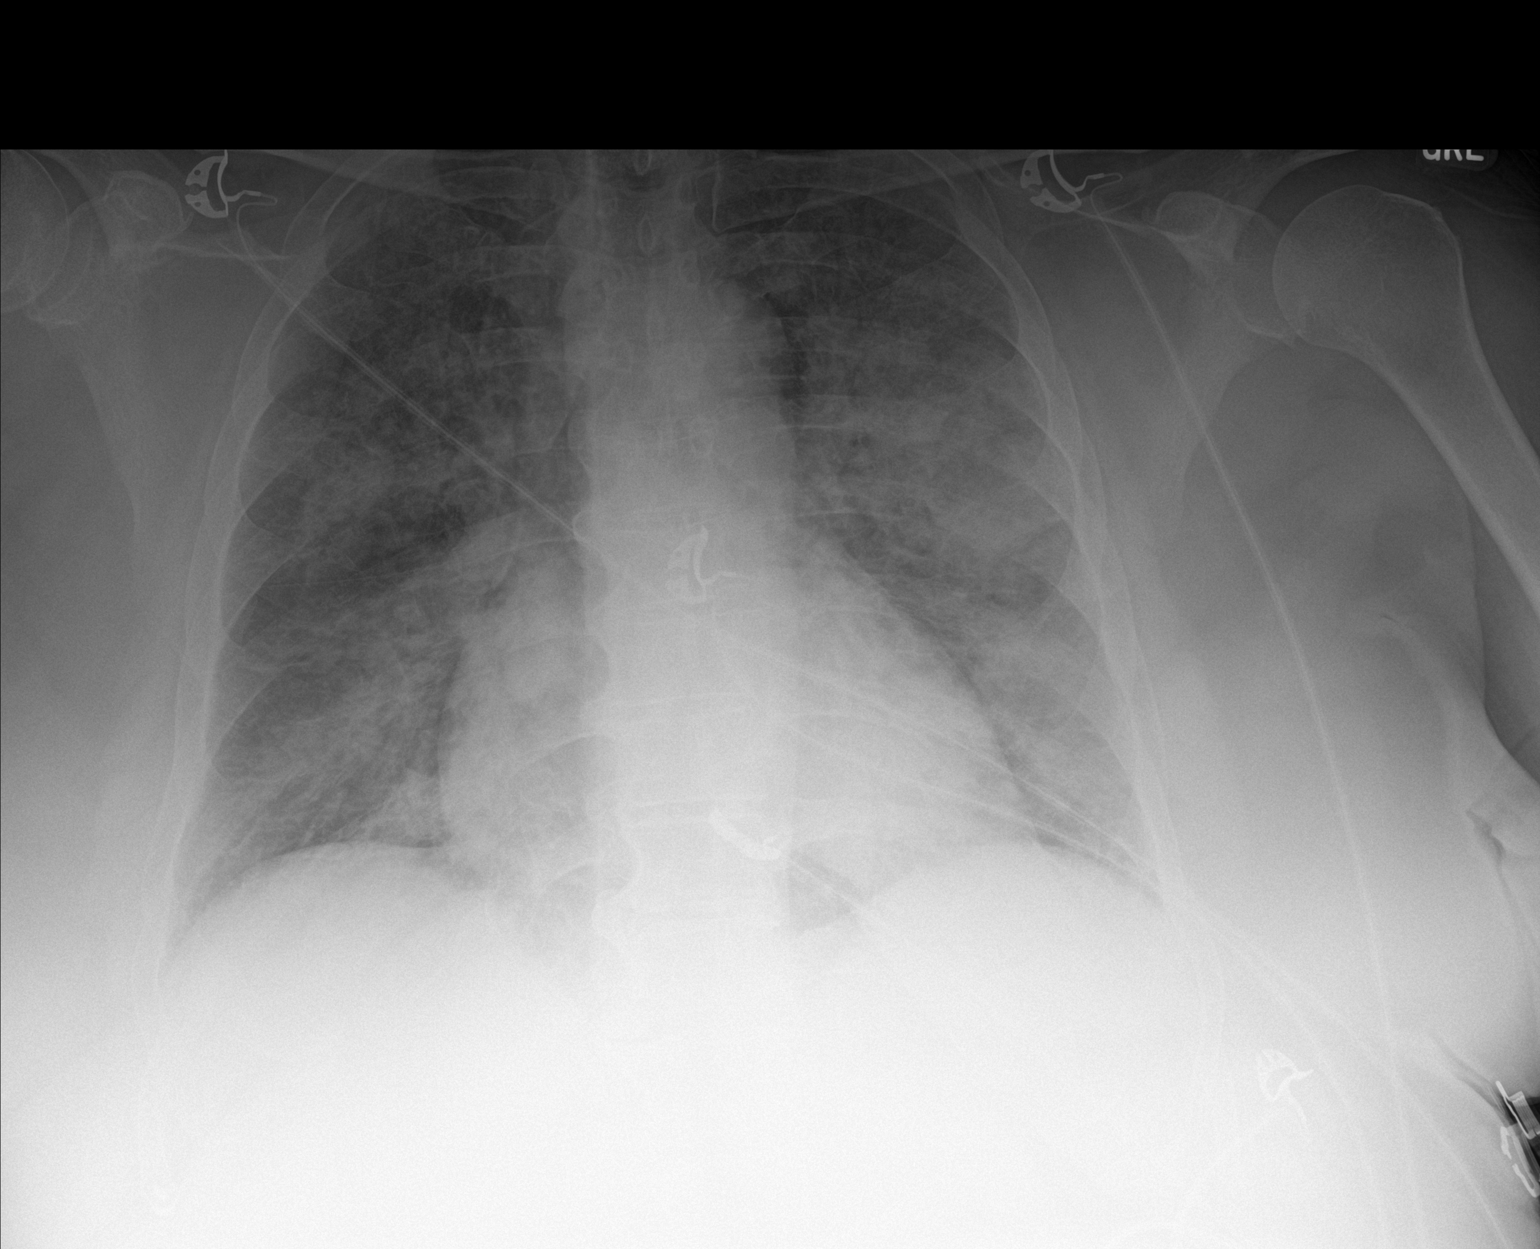

[1 of 1 positions shown; findings below may reference images not displayed]

FINDINGS: Central airspace opacities are present bilaterally, new. No
significant effusions. Cardiac and mediastinal contours are
unremarkable and unchanged.
IMPRESSION: New central airspace opacities. This may represent alveolar edema.
Infectious infiltrates are not entirely excluded.

## 2016-12-28 MED ORDER — ISOSORBIDE MONONITRATE ER 30 MG PO TB24: 30.0000 mg | ORAL_TABLET | Freq: Every day | ORAL | 5 refills | Status: DC

## 2016-12-28 NOTE — Progress Notes (Signed)
Patient ID: [name], female    DOB: August 12, 1955, 61 y.o.   MRN: [id number]  HPI  [name] is a 61 y/o female with a history of COPD, diabetes, NSTEMI, renal insufficiency, tobacco use and chronic heart failure.   Echo report from 12/19/16 reviewed and showed an EF of 40-45% along with moderate AS, mild Tanya and moderate MR. EF has declined from previous reading in October 2017. Cardiac catheterization done 12/23/16 showed 3 vessel CAD with considerate of CABG when pulmonary and renal status is optimized. Prox RCA to mid RCA has 100% stenosis, Prox CX to mid CX has 80% stenosis and Ost LAD to prox LAD has 90% stenosis.   Admitted 12/18/16 due to NSTEMI. Initially had to be intubated. Catheterization done with recommendation of CABG after renal function improves. Cardiology, GI and nephrology consults were all obtained. Discharged home after 8 days.   She presents today for her initial visit with a chief complaint of moderate fatigue upon little exertion. She describes this as chronic in nature having been present for several months with varying degrees of severity. She has associated light-headedness, anxiety and difficulty sleeping along with this. She denies any chest pain, shortness of breath, cough, edema or palpitations. She does have multiple questions regarding future CABG.    Past Medical History:  Diagnosis Date  . CHF (congestive heart failure) ([place])   . COPD (chronic obstructive pulmonary disease) ([place])   . Diabetes mellitus without complication [facility])    Past Surgical History:  Procedure Laterality Date  . APPENDECTOMY    . CARDIAC CATHETERIZATION    . CHOLECYSTECTOMY    . ESOPHAGOGASTRODUODENOSCOPY N/A 12/19/2016   Procedure: ESOPHAGOGASTRODUODENOSCOPY (EGD);  Surgeon: Lin Landsman, MD;  Location: [facility];  Service: Gastroenterology;  Laterality: N/A;  . LEFT HEART CATH AND CORONARY ANGIOGRAPHY N/A 12/23/2016   Procedure: LEFT HEART CATH AND CORONARY  ANGIOGRAPHY;  Surgeon: Teodoro Spray, MD;  Location: [place] [facility];  Service: Cardiovascular;  Laterality: N/A;   Family History  Problem Relation Age of Onset  . Hypertension Mother    Social History   Tobacco Use  . Smoking status: Current Every Day Smoker    Packs/day: 0.25    Types: Cigarettes  . Smokeless tobacco: Never Used  Substance Use Topics  . Alcohol use: No   Allergies  Allergen Reactions  . Latex Anaphylaxis and Rash  . Gabapentin Nausea And Vomiting   Prior to Admission medications   Medication Sig Start Date End Date Taking? Authorizing Provider  aspirin 325 MG tablet Take 1 tablet (325 mg total) by mouth daily. 12/23/16  Yes Dustin Flock, MD  atorvastatin (LIPITOR) 40 MG tablet Take 1 tablet (40 mg total) by mouth daily. 12/26/16 04/25/17 Yes Gladstone Lighter, MD  blood glucose meter kit and supplies KIT Dispense based on patient and insurance preference. Use up to four times daily as directed. (FOR ICD-9 250.00, 250.01). 12/24/16  Yes Hillary Bow, MD  carvedilol (COREG) 3.125 MG tablet Take 1 tablet (3.125 mg total) by mouth 2 (two) times daily with a meal. 12/23/16  Yes Dustin Flock, MD  Insulin Glargine (LANTUS SOLOSTAR) 100 UNIT/ML Solostar Pen Inject 15 Units into the skin daily. 12/25/16  Yes Sudini, Alveta Heimlich, MD  Insulin Pen Needle 32G X 5 MM MISC 1 Units by Does not apply route daily. 12/24/16  Yes Hillary Bow, MD  isosorbide mononitrate (IMDUR) 30 MG 24 hr tablet Take 1 tablet (30 mg total) by mouth daily. 12/28/16  Yes Darylene Price A, FNP  pantoprazole (PROTONIX) 40 MG tablet Take 1 tablet (40 mg total) by mouth 2 (two) times daily before a meal. 12/23/16  Yes Dustin Flock, MD  pregabalin (LYRICA) 100 MG capsule Take 100 mg by mouth 3 (three) times daily.   Yes [provider]    Review of Systems  Constitutional: Positive for appetite change (decreased) and fatigue.  HENT: Negative for congestion, postnasal drip and sore  throat.   Eyes: Negative.   Respiratory: Negative for chest tightness and shortness of breath.   Cardiovascular: Negative for chest pain, palpitations and leg swelling.  Gastrointestinal: Negative for abdominal distention and abdominal pain.  Endocrine: Negative.   Genitourinary: Negative.   Musculoskeletal: Positive for back pain (chronic back pain). Negative for neck pain.  Skin: Negative.   Allergic/Immunologic: Negative.   Neurological: Positive for weakness and light-headedness (yesterday). Negative for dizziness.  Hematological: Negative for adenopathy. Does not bruise/bleed easily.  Psychiatric/Behavioral: Positive for sleep disturbance (wake often during the night). Negative for dysphoric mood. The patient is nervous/anxious.    Vitals:   12/28/16 1103  BP: (!) 142/68  Pulse: 93  Resp: 18  SpO2: 98%  Weight: 205 lb 8 oz (93.2 kg)  Height: _0  (1.651 m)   Wt Readings from Last 3 Encounters:  12/28/16 205 lb 8 oz (93.2 kg)  12/23/16 209 lb (94.8 kg)  12/07/15 236 lb 3.2 oz (107.1 kg)   Lab Results  Component Value Date   CREATININE 1.86 (H) 12/26/2016   CREATININE 2.05 (H) 12/24/2016   CREATININE 1.99 (H) 12/23/2016    Physical Exam  Constitutional: She is oriented to person, place, and time. She appears well-developed and well-nourished.  HENT:  Head: Normocephalic and atraumatic.  Neck: Normal range of motion. Neck supple. No JVD present.  Cardiovascular: Normal rate and regular rhythm.  Pulmonary/Chest: Effort normal. She has no wheezes. She has no rales.  Abdominal: Soft. She exhibits no distension. There is no tenderness.  Musculoskeletal: She exhibits no edema or tenderness.  Neurological: She is alert and oriented to person, place, and time.  Skin: Skin is warm and dry.  Psychiatric: She has a normal mood and affect. Her behavior is normal. Thought content normal.  Nursing note and vitals reviewed.  Assessment & Plan:  1: Chronic heart failure with  reduced ejection fraction- - NYHA class III - euvolemic today - not weighing daily as she doesn't have any scales. Set of scales was given to her and she was instructed to weigh every morning, write the weight down and call for an overnight weight gain of >2 pounds or a weekly weight gain of >5 pounds - adds "some" salt to foods and admits that they eat fast food and convenience type foods more often now. Discussed the importance of not adding any salt to her food and to read food labels so that she can closely follow a 2065m sodium diet. Written dietary information was given to her about this.  - unsure of her fluid intake. Discussed keeping her daily fluid intake to between 40-60 ounces of fluid daily. Instructed that this included anything that was liquid at room temperature such as jello/ice cream - she reports receiving her flu vaccine for this season already  2: NSTEMI- - does not have cardiology f/u appointment scheduled and has questions regarding possible CABG - called Dr FBethanne Gingeroffice (who did the cath) and was told that [facility]and a f/u appointment  could not be scheduled. However, receptionist said she would speak with Dr. Ubaldo Glassing - in the meantime, an appointment was made with Dr. Saunders Revel who could see her today. CMA took patient down to [facility] cardiology and then [facility] called back saying they would schedule an appointment with the patient - spoke with Dr. Saunders Revel who said that he would call Dr. Ubaldo Glassing to ask what he would prefer to do so that patient can get scheduled for possible CABG  3: Diabetes- - fasting glucose in clinic today was 179 - currently doesn't have a PCP but she says that she will call around today and get something scheduled - BMP from 12/26/16 reviewed and showed sodium 135, potassium 4.5 and GFR 32 - A1c from 08/12/16 reviewed and was >14%  4: Tobacco- - currently smoking 1-2 cigarettes daily - complete cessation discussed  for 3 minutes with her.  Patient did not bring her medications nor a list. Each medication was verbally reviewed with the patient and she was encouraged to bring the bottles to every visit to confirm accuracy of list.  Return in 1 month or sooner for any questions/problems before then.

## 2016-12-28 NOTE — Patient Instructions (Addendum)
Begin weighing daily and call for an overnight weight gain of > 2 pounds or a weekly weight gain of >5 pounds.  Drink between 40-60 ounces   Low-Sodium Eating Plan Sodium, which is an element that makes up salt, helps you maintain a healthy balance of fluids in your body. Too much sodium can increase your blood pressure and cause fluid and waste to be held in your body. Your health care provider or dietitian may recommend following this plan if you have high blood pressure (hypertension), kidney disease, liver disease, or heart failure. Eating less sodium can help lower your blood pressure, reduce swelling, and protect your heart, liver, and kidneys. What are tips for following this plan? General guidelines  Most people on this plan should limit their sodium intake to 1,500-2,000 mg (milligrams) of sodium each day. Reading food labels  The Nutrition Facts label lists the amount of sodium in one serving of the food. If you eat more than one serving, you must multiply the listed amount of sodium by the number of servings.  Choose foods with less than 140 mg of sodium per serving.  Avoid foods with 300 mg of sodium or more per serving. Shopping  Look for lower-sodium products, often labeled as "low-sodium" or "no salt added."  Always check the sodium content even if foods are labeled as "unsalted" or "no salt added".  Buy fresh foods. ? Avoid canned foods and premade or frozen meals. ? Avoid canned, cured, or processed meats  Buy breads that have less than 80 mg of sodium per slice. Cooking  Eat more home-cooked food and less restaurant, buffet, and fast food.  Avoid adding salt when cooking. Use salt-free seasonings or herbs instead of table salt or sea salt. Check with your health care provider or pharmacist before using salt substitutes.  Cook with plant-based oils, such as canola, sunflower, or olive oil. Meal planning  When eating at a restaurant, ask that your food be  prepared with less salt or no salt, if possible.  Avoid foods that contain MSG (monosodium glutamate). MSG is sometimes added to [place] food, bouillon, and some canned foods. What foods are recommended? The items listed may not be a complete list. Talk with your dietitian about what dietary choices are best for you. Grains Low-sodium cereals, including oats, puffed wheat and rice, and shredded wheat. Low-sodium crackers. Unsalted rice. Unsalted pasta. Low-sodium bread. Whole-grain breads and whole-grain pasta. Vegetables Fresh or frozen vegetables. "No salt added" canned vegetables. "No salt added" tomato sauce and paste. Low-sodium or reduced-sodium tomato and vegetable juice. Fruits Fresh, frozen, or canned fruit. Fruit juice. Meats and other protein foods Fresh or frozen (no salt added) meat, poultry, seafood, and fish. Low-sodium canned tuna and salmon. Unsalted nuts. Dried peas, beans, and lentils without added salt. Unsalted canned beans. Eggs. Unsalted nut butters. Dairy Milk. Soy milk. Cheese that is naturally low in sodium, such as ricotta cheese, fresh mozzarella, or Swiss cheese Low-sodium or reduced-sodium cheese. Cream cheese. Yogurt. Fats and oils Unsalted butter. Unsalted margarine with no trans fat. Vegetable oils such as canola or olive oils. Seasonings and other foods Fresh and dried herbs and spices. Salt-free seasonings. Low-sodium mustard and ketchup. Sodium-free salad dressing. Sodium-free light mayonnaise. Fresh or refrigerated horseradish. Lemon juice. Vinegar. Homemade, reduced-sodium, or low-sodium soups. Unsalted popcorn and pretzels. Low-salt or salt-free chips. What foods are not recommended? The items listed may not be a complete list. Talk with your dietitian about what dietary choices are best for  you. Grains Instant hot cereals. Bread stuffing, pancake, and biscuit mixes. Croutons. Seasoned rice or pasta mixes. Noodle soup cups. Boxed or frozen macaroni and  cheese. Regular salted crackers. Self-rising flour. Vegetables Sauerkraut, pickled vegetables, and relishes. Olives. [place] fries. Onion rings. Regular canned vegetables (not low-sodium or reduced-sodium). Regular canned tomato sauce and paste (not low-sodium or reduced-sodium). Regular tomato and vegetable juice (not low-sodium or reduced-sodium). Frozen vegetables in sauces. Meats and other protein foods Meat or fish that is salted, canned, smoked, spiced, or pickled. Bacon, ham, sausage, hotdogs, corned beef, chipped beef, packaged lunch meats, salt pork, jerky, pickled herring, anchovies, regular canned tuna, sardines, salted nuts. Dairy Processed cheese and cheese spreads. Cheese curds. Blue cheese. Feta cheese. String cheese. Regular cottage cheese. Buttermilk. Canned milk. Fats and oils Salted butter. Regular margarine. Ghee. Bacon fat. Seasonings and other foods Onion salt, garlic salt, seasoned salt, table salt, and sea salt. Canned and packaged gravies. Worcestershire sauce. Tartar sauce. Barbecue sauce. Teriyaki sauce. Soy sauce, including reduced-sodium. Steak sauce. Fish sauce. Oyster sauce. Cocktail sauce. Horseradish that you find on the shelf. Regular ketchup and mustard. Meat flavorings and tenderizers. Bouillon cubes. Hot sauce and Tabasco sauce. Premade or packaged marinades. Premade or packaged taco seasonings. Relishes. Regular salad dressings. Salsa. Potato and tortilla chips. Corn chips and puffs. Salted popcorn and pretzels. Canned or dried soups. Pizza. Frozen entrees and pot pies. Summary  Eating less sodium can help lower your blood pressure, reduce swelling, and protect your heart, liver, and kidneys.  Most people on this plan should limit their sodium intake to 1,500-2,000 mg (milligrams) of sodium each day.  Canned, boxed, and frozen foods are high in sodium. Restaurant foods, fast foods, and pizza are also very high in sodium. You also get sodium by adding salt to  food.  Try to cook at home, eat more fresh fruits and vegetables, and eat less fast food, canned, processed, or prepared foods. This information is not intended to replace advice given to you by your health care provider. Make sure you discuss any questions you have with your health care provider. Document Released: 06/25/2001 Document Revised: 12/28/2015 Document Reviewed: 12/28/2015 Elsevier Interactive Patient Education  2017 [employer].

## 2016-12-29 IMAGING — DX DG ABDOMEN 1V
2 series · 2 of 2 positions shown · non-contrast
Comparison: CT abdomen and pelvis March 12, 2012

CLINICAL DATA: Abdominal pain

EXAM:
ABDOMEN - 1 VIEW

[abdomen kub (1 of 2)]
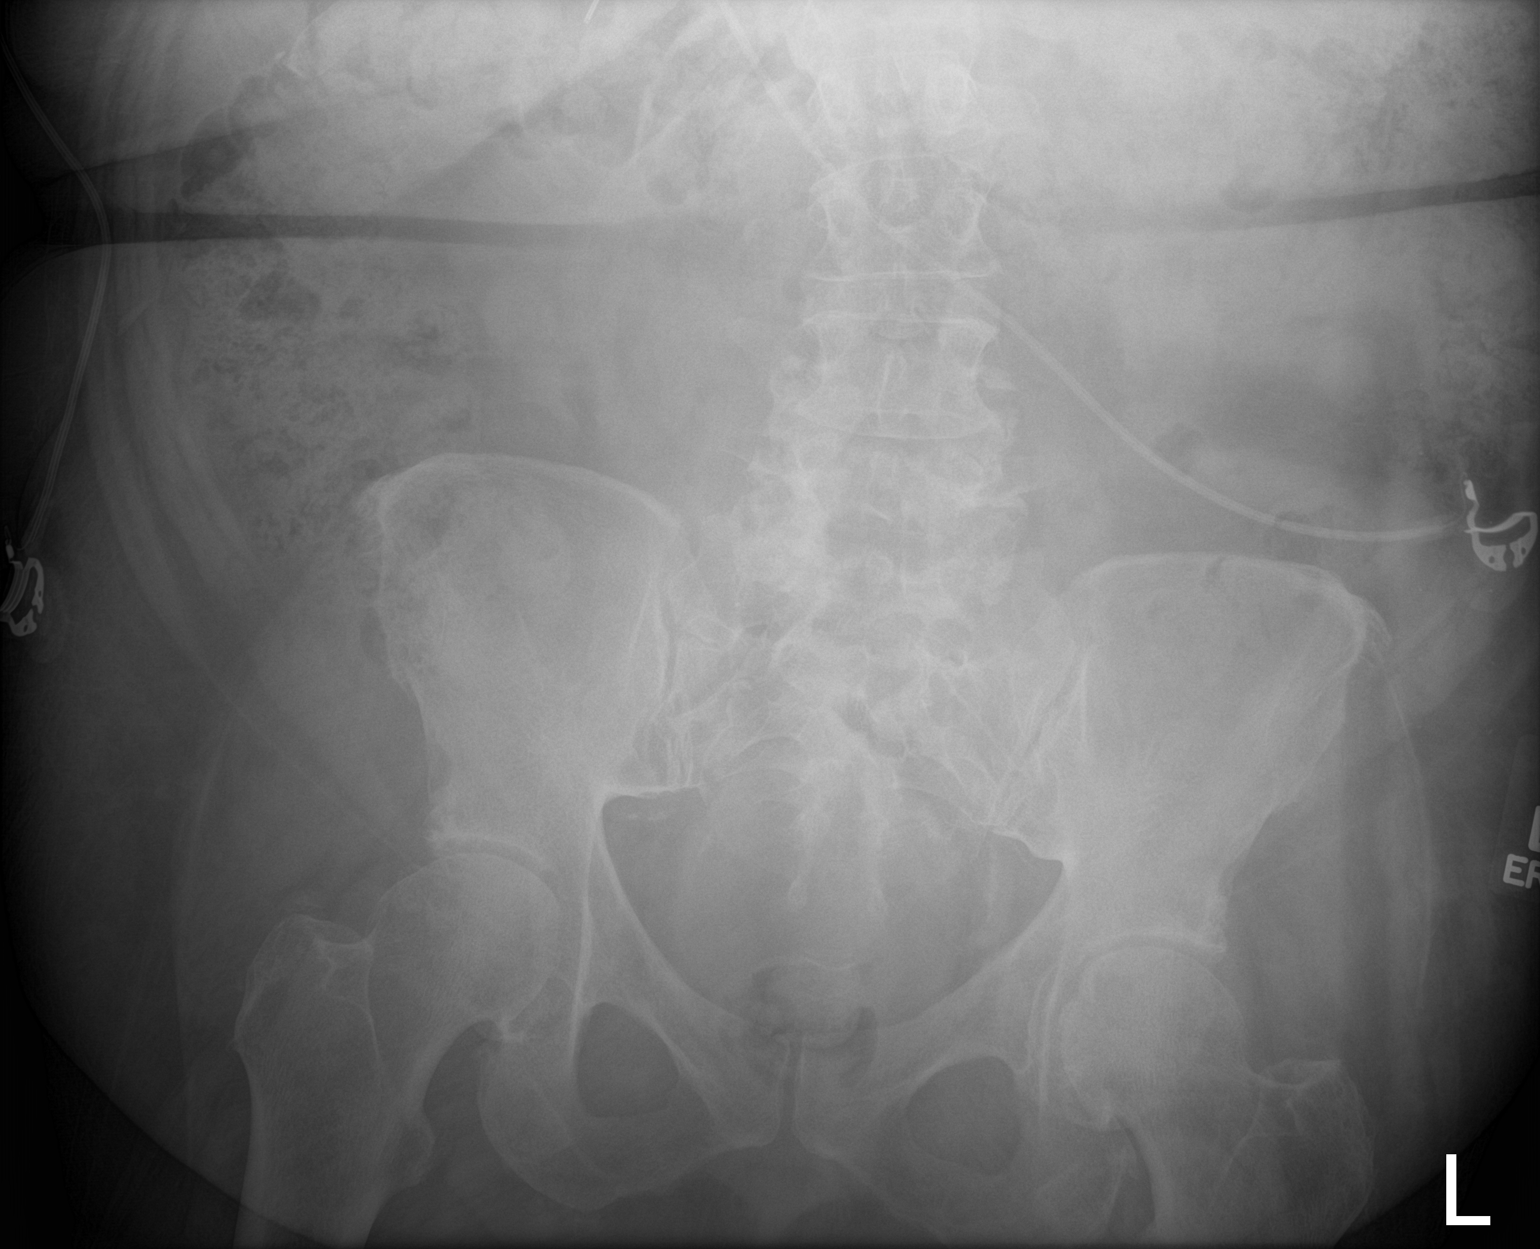

[abdomen kub (2 of 2)]
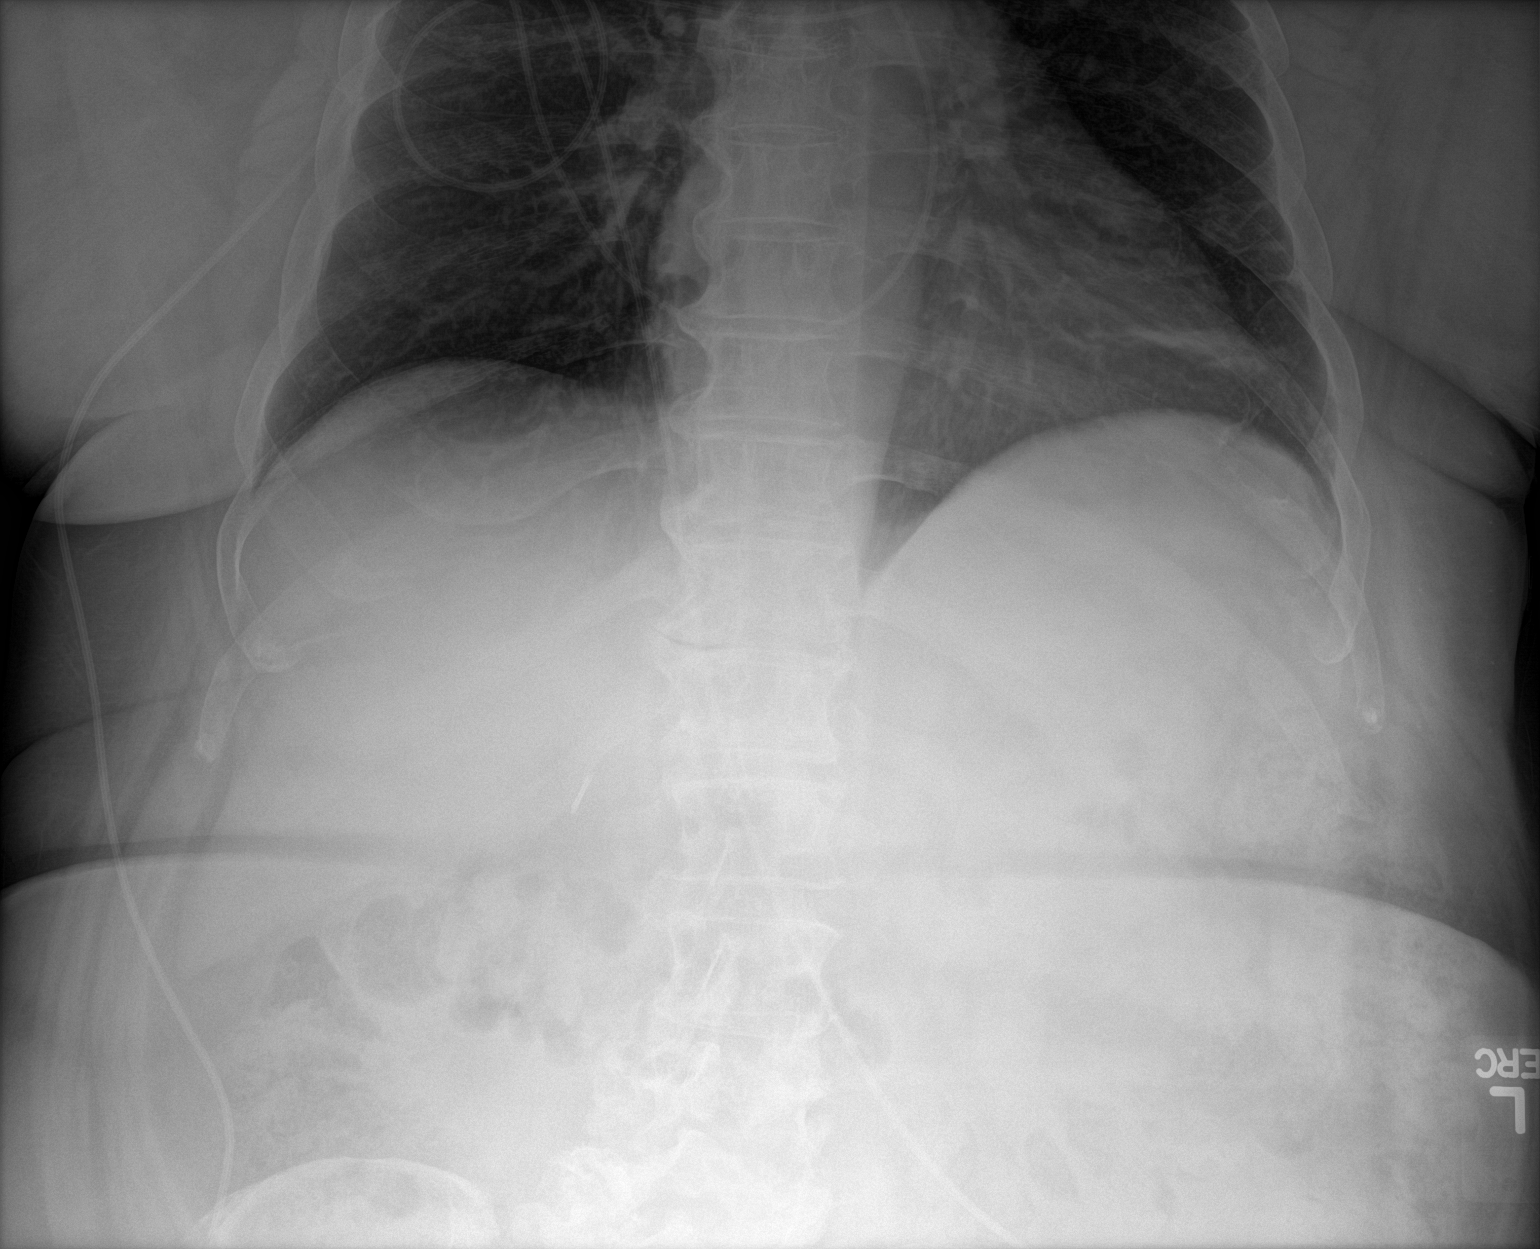

[2 of 2 positions shown; findings below may reference images not displayed]

FINDINGS: There is moderate stool throughout the colon. There is no bowel
dilatation or air-fluid level suggesting bowel obstruction. No free
air. There is atelectatic change in the left lung base. There are
arterial vascular calcifications in the pelvis.
IMPRESSION: No bowel obstruction or free air. Moderate stool throughout colon.
Atelectasis left lung base. Iliac artery atherosclerosis.

## 2016-12-29 IMAGING — DX DG CHEST 1V PORT
1 series · 1 of 1 positions shown · non-contrast
Comparison: Yesterday

CLINICAL DATA: Respiratory failure

EXAM:
PORTABLE CHEST 1 VIEW

[chest ap]
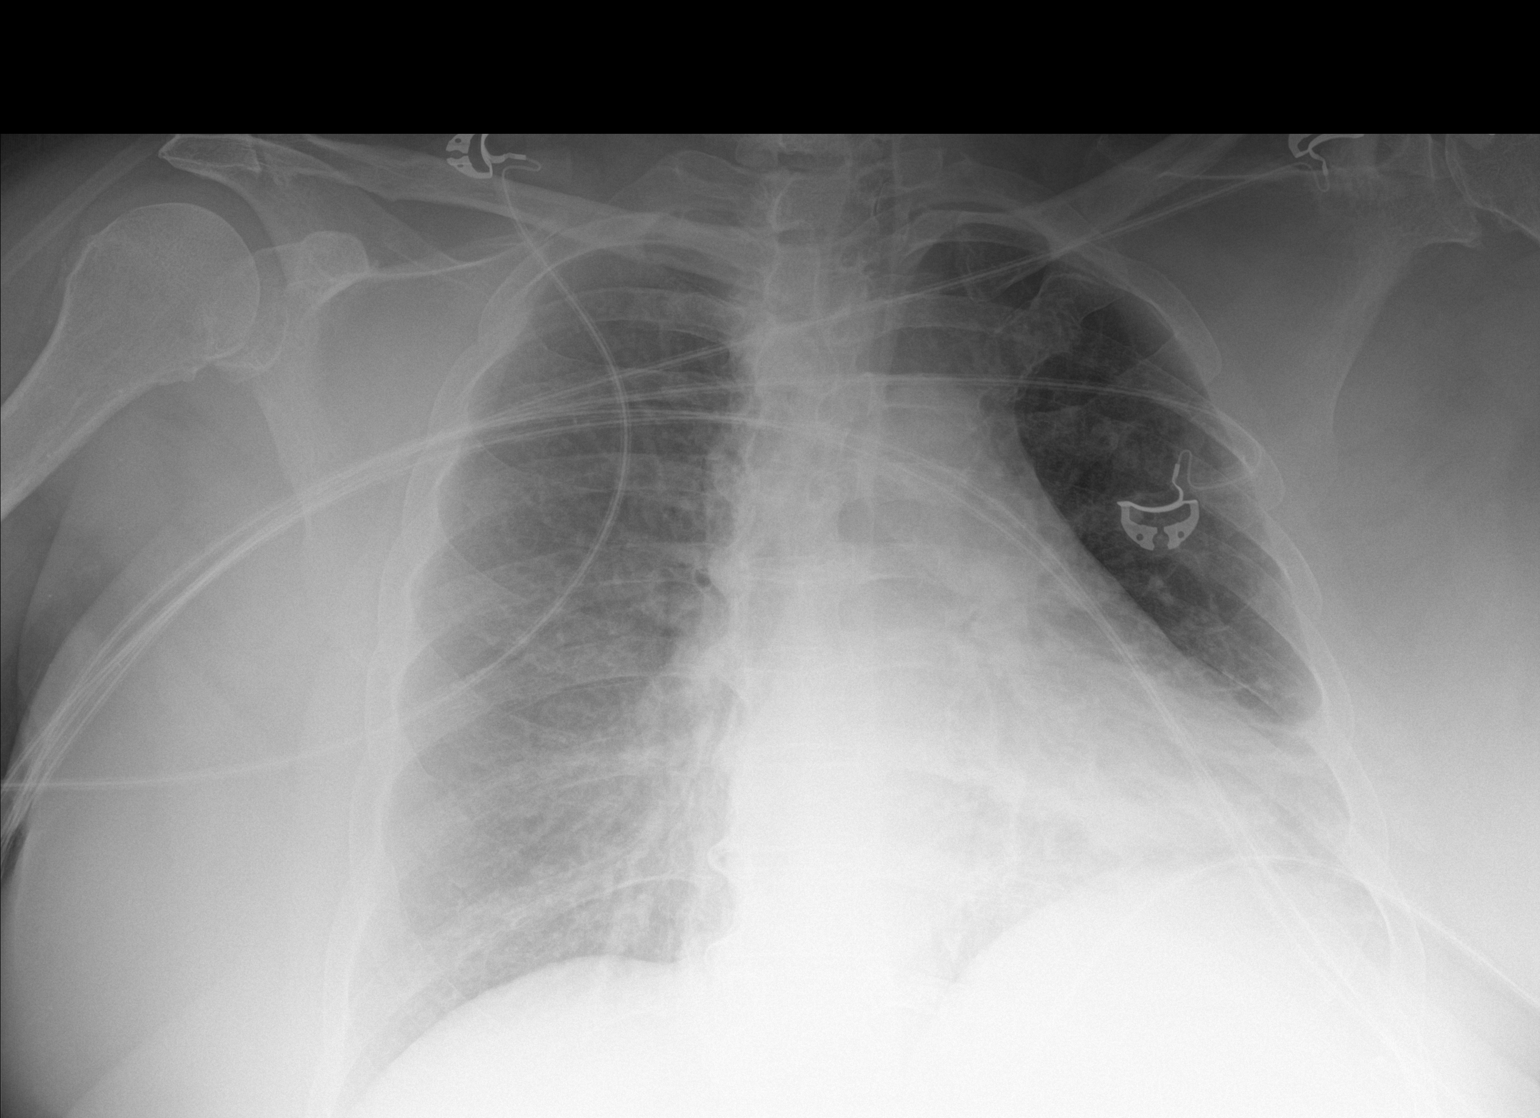

[1 of 1 positions shown; findings below may reference images not displayed]

FINDINGS: Rapid partial clearing of bilateral airspace disease. More streaky
opacities now present, with volume loss on the left.
Cardiopericardial enlargement. No edema, effusion, or pneumothorax.
Tracheal deviation attributed to rotation, not seen previously.
IMPRESSION: Significantly improved airspace disease/edema. Increased basilar
atelectasis, especially on the left, with superimposed aspiration or
pneumonia not excluded.

## 2016-12-29 NOTE — Progress Notes (Signed)
This encounter was created in error - please disregard.

## 2017-01-03 ENCOUNTER — Telehealth: Payer: Self-pay

## 2017-01-03 NOTE — Telephone Encounter (Signed)
-----   Message from [place], [place] sent at 31/01/2160  9:05 AM EST ----- Regarding: FW: hfu Schedule pt in a 30 minute for a hosp f/u in 4-6 weeks for OSA and dyspnea with DR.  Thanks, Misty ----- Message ----- From: Laverle Hobby, MD Sent: 12/21/2016   5:00 PM To: [facility] Clinical Pool Subject: hfu                                            Needs hfu in 4-6 weeks for OSA and dyspnea.

## 2017-01-03 NOTE — Telephone Encounter (Signed)
Lmov for patient to call back and schedule appointment

## 2017-01-05 NOTE — Telephone Encounter (Signed)
Pt scheduled 14/19 with Dr Ashby Dawes

## 2017-01-20 ENCOUNTER — Inpatient Hospital Stay: Payer: Medicaid Other | Admitting: Internal Medicine

## 2017-01-25 NOTE — Progress Notes (Deleted)
Patient ID: ONELL MCMATH, female    DOB: 1955-08-16, 62 y.o.   MRN: [id number]  HPI  Ms Enterline is a 62 y/o female with a history of COPD, diabetes, NSTEMI, renal insufficiency, tobacco use and chronic heart failure.   Echo report from 12/19/16 reviewed and showed an EF of 40-45% along with moderate AS, mild MS and moderate MR. EF has declined from previous reading in October 2017. Cardiac catheterization done 12/23/16 showed 3 vessel CAD with considerate of CABG when pulmonary and renal status is optimized. Prox RCA to mid RCA has 100% stenosis, Prox CX to mid CX has 80% stenosis and Ost LAD to prox LAD has 90% stenosis.   Admitted 12/18/16 due to NSTEMI. Initially had to be intubated. Catheterization done with recommendation of CABG after renal function improves. Cardiology, GI and nephrology consults were all obtained. Discharged home after 8 days.   She presents today for a follow-up visit with a chief complaint of     Past Medical History:  Diagnosis Date  . CHF (congestive heart failure) ([place])   . COPD (chronic obstructive pulmonary disease) ([place])   . Diabetes mellitus without complication ([place])   . NSTEMI (non-ST elevated myocardial infarction) [facility])    Past Surgical History:  Procedure Laterality Date  . APPENDECTOMY    . CARDIAC CATHETERIZATION    . CHOLECYSTECTOMY    . ESOPHAGOGASTRODUODENOSCOPY N/A 12/19/2016   Procedure: ESOPHAGOGASTRODUODENOSCOPY (EGD);  Surgeon: Lin Landsman, MD;  Location: [facility] ENDOSCOPY;  Service: Gastroenterology;  Laterality: N/A;  . LEFT HEART CATH AND CORONARY ANGIOGRAPHY N/A 12/23/2016   Procedure: LEFT HEART CATH AND CORONARY ANGIOGRAPHY;  Surgeon: Teodoro Spray, MD;  Location: [facility] CV LAB;  Service: Cardiovascular;  Laterality: N/A;   Family History  Problem Relation Age of Onset  . Hypertension Mother    Social History   Tobacco Use  . Smoking status: Current Every Day Smoker    Packs/day: 0.25    Types: Cigarettes   . Smokeless tobacco: Never Used  Substance Use Topics  . Alcohol use: No   Allergies  Allergen Reactions  . Latex Anaphylaxis and Rash  . Gabapentin Nausea And Vomiting     Review of Systems  Constitutional: Positive for appetite change (decreased) and fatigue.  HENT: Negative for congestion, postnasal drip and sore throat.   Eyes: Negative.   Respiratory: Negative for chest tightness and shortness of breath.   Cardiovascular: Negative for chest pain, palpitations and leg swelling.  Gastrointestinal: Negative for abdominal distention and abdominal pain.  Endocrine: Negative.   Genitourinary: Negative.   Musculoskeletal: Positive for back pain (chronic back pain). Negative for neck pain.  Skin: Negative.   Allergic/Immunologic: Negative.   Neurological: Positive for weakness and light-headedness (yesterday). Negative for dizziness.  Hematological: Negative for adenopathy. Does not bruise/bleed easily.  Psychiatric/Behavioral: Positive for sleep disturbance (wake often during the night). Negative for dysphoric mood. The patient is nervous/anxious.      Physical Exam  Constitutional: She is oriented to person, place, and time. She appears well-developed and well-nourished.  HENT:  Head: Normocephalic and atraumatic.  Neck: Normal range of motion. Neck supple. No JVD present.  Cardiovascular: Normal rate and regular rhythm.  Pulmonary/Chest: Effort normal. She has no wheezes. She has no rales.  Abdominal: Soft. She exhibits no distension. There is no tenderness.  Musculoskeletal: She exhibits no edema or tenderness.  Neurological: She is alert and oriented to person, place, and time.  Skin: Skin is warm and dry.  Psychiatric: She has a normal mood and affect. Her behavior is normal. Thought content normal.  Nursing note and vitals reviewed.  Assessment & Plan:  1: Chronic heart failure with reduced ejection fraction- - NYHA class III - euvolemic today - not weighing  daily as she doesn't have any scales. Set of scales was given to her and she was instructed to weigh every morning, write the weight down and call for an overnight weight gain of >2 pounds or a weekly weight gain of >5 pounds - adds "some" salt to foods and admits that they eat fast food and convenience type foods more often now. Discussed the importance of not adding any salt to her food and to read food labels so that she can closely follow a 2029m sodium diet.  - unsure of her fluid intake. Discussed keeping her daily fluid intake to between 40-60 ounces of fluid daily. Instructed that this included anything that was liquid at room temperature such as jello/ice cream - she reports receiving her flu vaccine for this season already  2: NSTEMI- - saw cardiologist (Ubaldo Glassing 12/28/16 - supposed to be be referred for CABG evaluation  3: Diabetes- - fasting glucose in clinic today was  - currently doesn't have a PCP but she says that she will call around today and get something scheduled - BMP from 12/28/16 reviewed and showed sodium 141, potassium 4.5 and GFR 37 - A1c from 08/12/16 reviewed and was >14%  4: Tobacco- - currently smoking 1-2 cigarettes daily - complete cessation discussed for 3 minutes with her.  Patient did not bring her medications nor a list. Each medication was verbally reviewed with the patient and she was encouraged to bring the bottles to every visit to confirm accuracy of list.

## 2017-01-26 ENCOUNTER — Ambulatory Visit: Payer: Medicaid Other | Admitting: Family

## 2017-01-26 ENCOUNTER — Telehealth: Payer: Self-pay | Admitting: Family

## 2017-01-26 NOTE — Telephone Encounter (Signed)
Patient did not show for her Heart Failure Clinic appointment on 01/26/17. Will attempt to reschedule.

## 2017-06-22 ENCOUNTER — Emergency Department: Payer: Medicaid Other

## 2017-06-22 ENCOUNTER — Encounter: Payer: Self-pay | Admitting: Emergency Medicine

## 2017-06-22 ENCOUNTER — Emergency Department
Admission: EM | Admit: 2017-06-22 | Discharge: 2017-06-22 | Disposition: A | Discharge status: Home / Self Care | Payer: Medicaid Other | Attending: Student in an Organized Health Care Education/Training Program | Admitting: Student in an Organized Health Care Education/Training Program

## 2017-06-22 DIAGNOSIS — I5022 Chronic systolic (congestive) heart failure: Secondary | ICD-10-CM | POA: Diagnosis not present

## 2017-06-22 DIAGNOSIS — Z91040 Latex allergy status: Secondary | ICD-10-CM | POA: Insufficient documentation

## 2017-06-22 DIAGNOSIS — R531 Weakness: Secondary | ICD-10-CM | POA: Insufficient documentation

## 2017-06-22 DIAGNOSIS — Z794 Long term (current) use of insulin: Secondary | ICD-10-CM | POA: Diagnosis not present

## 2017-06-22 DIAGNOSIS — I252 Old myocardial infarction: Secondary | ICD-10-CM | POA: Insufficient documentation

## 2017-06-22 DIAGNOSIS — I214 Non-ST elevation (NSTEMI) myocardial infarction: Secondary | ICD-10-CM

## 2017-06-22 DIAGNOSIS — E119 Type 2 diabetes mellitus without complications: Secondary | ICD-10-CM | POA: Diagnosis not present

## 2017-06-22 DIAGNOSIS — Z79899 Other long term (current) drug therapy: Secondary | ICD-10-CM | POA: Diagnosis not present

## 2017-06-22 DIAGNOSIS — J449 Chronic obstructive pulmonary disease, unspecified: Secondary | ICD-10-CM | POA: Insufficient documentation

## 2017-06-22 DIAGNOSIS — R42 Dizziness and giddiness: Secondary | ICD-10-CM | POA: Insufficient documentation

## 2017-06-22 DIAGNOSIS — R110 Nausea: Secondary | ICD-10-CM | POA: Insufficient documentation

## 2017-06-22 DIAGNOSIS — Z7982 Long term (current) use of aspirin: Secondary | ICD-10-CM | POA: Diagnosis not present

## 2017-06-22 DIAGNOSIS — F17210 Nicotine dependence, cigarettes, uncomplicated: Secondary | ICD-10-CM | POA: Insufficient documentation

## 2017-06-22 LAB — BASIC METABOLIC PANEL
Anion gap: 10 (ref 5–15)
BUN: 16 mg/dL (ref 6–20)
CALCIUM: 9 mg/dL (ref 8.9–10.3)
CO2: 21 mmol/L — ABNORMAL LOW (ref 22–32)
CREATININE: 1.09 mg/dL — AB (ref 0.44–1.00)
Chloride: 106 mmol/L (ref 101–111)
GFR calc Af Amer: >60 mL/min (ref >60)
GFR, EST NON AFRICAN AMERICAN: 53 mL/min — AB (ref >60)
Glucose, Bld: 155 mg/dL — ABNORMAL HIGH (ref 65–99)
POTASSIUM: 3.6 mmol/L (ref 3.5–5.1)
SODIUM: 137 mmol/L (ref 135–145)

## 2017-06-22 LAB — CBC
HCT: 35.8 % (ref 35.0–47.0)
Hemoglobin: 11.7 g/dL — ABNORMAL LOW (ref 12.0–16.0)
MCH: 26 pg (ref 26.0–34.0)
MCHC: 32.8 g/dL (ref 32.0–36.0)
MCV: 79.3 fL — ABNORMAL LOW (ref 80.0–100.0)
Platelets: 130 10*3/uL — ABNORMAL LOW (ref 150–440)
RBC: 4.51 MIL/uL (ref 3.80–5.20)
RDW: 16.5 % — ABNORMAL HIGH (ref 11.5–14.5)
WBC: 9 10*3/uL (ref 3.6–11.0)

## 2017-06-22 LAB — BRAIN NATRIURETIC PEPTIDE: B Natriuretic Peptide: 587 pg/mL — ABNORMAL HIGH (ref 0.0–100.0)

## 2017-06-22 LAB — TROPONIN I
Troponin I: 0.1 ng/mL (ref <0.03)
Troponin I: 0.09 ng/mL

## 2017-06-22 MED ORDER — ISOSORBIDE MONONITRATE ER 30 MG PO TB24: 30.0000 mg | ORAL_TABLET | Freq: Every day | ORAL | 5 refills | Status: DC

## 2017-06-22 MED ORDER — INSULIN GLARGINE 100 UNIT/ML SOLOSTAR PEN: 15.0000 [IU] | PEN_INJECTOR | Freq: Every day | SUBCUTANEOUS | 0 refills | Status: DC

## 2017-06-22 MED ORDER — CARVEDILOL 3.125 MG PO TABS
3.1250 mg | ORAL_TABLET | Freq: Two times a day (BID) | ORAL | 0 refills | Status: DC
Start: 2017-06-22 — End: 2017-08-09

## 2017-06-22 MED ORDER — ATORVASTATIN CALCIUM 40 MG PO TABS: 40.0000 mg | ORAL_TABLET | Freq: Every day | ORAL | 3 refills | Status: DC

## 2017-06-22 MED ORDER — PREGABALIN 100 MG PO CAPS: 100.0000 mg | ORAL_CAPSULE | Freq: Three times a day (TID) | ORAL | 0 refills | Status: DC

## 2017-06-22 MED ORDER — ASPIRIN 325 MG PO TABS: 325.0000 mg | ORAL_TABLET | Freq: Every day | ORAL | 0 refills | Status: AC

## 2017-06-22 MED ORDER — ISOSORBIDE MONONITRATE ER 60 MG PO TB24
30.0000 mg | ORAL_TABLET | Freq: Every day | ORAL | Status: DC
  Administered 2017-06-22: 30 mg via ORAL
  Filled 2017-06-22: qty 1

## 2017-06-22 MED ORDER — CARVEDILOL 6.25 MG PO TABS
12.5000 mg | ORAL_TABLET | Freq: Two times a day (BID) | ORAL | Status: DC
  Administered 2017-06-22: 12.5 mg via ORAL
  Filled 2017-06-22: qty 2

## 2017-06-22 MED ORDER — ASPIRIN 81 MG PO CHEW
324.0000 mg | CHEWABLE_TABLET | Freq: Once | ORAL | Status: AC
  Administered 2017-06-22: 324 mg via ORAL
  Filled 2017-06-22: qty 4

## 2017-06-22 MED ORDER — ONDANSETRON HCL 4 MG PO TABS
4.0000 mg | ORAL_TABLET | Freq: Once | ORAL | Status: AC
  Administered 2017-06-22: 4 mg via ORAL
  Filled 2017-06-22: qty 1

## 2017-06-22 NOTE — ED Provider Notes (Signed)
[facility] Emergency Department Provider Note    First MD Initiated Contact with Patient 06/22/17 1344     (approximate)  I have reviewed the triage vital signs and the nursing notes.   HISTORY  Chief Complaint Weakness    HPI [name] is a 62 y.o. female with a history of CHF and COPD presents to the ER with chief complaint of several weeks of weakness and difficulty walking.  States that she feels lightheaded and faint every time she stands up.  Patient status post recent bypass surgery in [place].  Just returning back home.  States that she feels that she should have been placed in rehab though the surgery was several months ago.  She denies any chest pain.  No shortness of breath at rest.  States she is also complaining of nausea and feeling like she does not want to eat.  Denies any lower extremity swelling.  No fevers.    Past Medical History:  Diagnosis Date  . CHF (congestive heart failure) ([place])   . COPD (chronic obstructive pulmonary disease) ([place])   . Diabetes mellitus without complication ([place])   . NSTEMI (non-ST elevated myocardial infarction) ([place])    Family History  Problem Relation Age of Onset  . Hypertension Mother    Past Surgical History:  Procedure Laterality Date  . APPENDECTOMY    . CARDIAC CATHETERIZATION    . CHOLECYSTECTOMY    . ESOPHAGOGASTRODUODENOSCOPY N/A 12/19/2016   Procedure: ESOPHAGOGASTRODUODENOSCOPY (EGD);  Surgeon: Lin Landsman, MD;  Location: [facility] ENDOSCOPY;  Service: Gastroenterology;  Laterality: N/A;  . LEFT HEART CATH AND CORONARY ANGIOGRAPHY N/A 12/23/2016   Procedure: LEFT HEART CATH AND CORONARY ANGIOGRAPHY;  Surgeon: Teodoro Spray, MD;  Location: [facility] CV LAB;  Service: Cardiovascular;  Laterality: N/A;   Patient Active Problem List   Diagnosis Date Noted  . Chronic systolic heart failure ([place]) 12/28/2016  . Diabetes ([place]) 12/28/2016  . Tobacco use 12/28/2016  . NSTEMI  (non-ST elevated myocardial infarction) ([place])   . Sepsis ([place]) 12/18/2016  . CHF exacerbation ([place]) 12/04/2015  . PNA (pneumonia) 10/28/2015  . COPD exacerbation ([place]) 08/29/2015      Prior to Admission medications   Medication Sig Start Date End Date Taking? Authorizing Provider  aspirin 325 MG tablet Take 1 tablet (325 mg total) by mouth daily. 12/23/16   Dustin Flock, MD  atorvastatin (LIPITOR) 40 MG tablet Take 1 tablet (40 mg total) by mouth daily. 12/26/16 04/25/17  Gladstone Lighter, MD  blood glucose meter kit and supplies KIT Dispense based on patient and insurance preference. Use up to four times daily as directed. (FOR ICD-9 250.00, 250.01). 12/24/16   Hillary Bow, MD  carvedilol (COREG) 3.125 MG tablet Take 1 tablet (3.125 mg total) by mouth 2 (two) times daily with a meal. 12/23/16   Dustin Flock, MD  Insulin Glargine (LANTUS SOLOSTAR) 100 UNIT/ML Solostar Pen Inject 15 Units into the skin daily. 12/25/16   Hillary Bow, MD  Insulin Pen Needle 32G X 5 MM MISC 1 Units by Does not apply route daily. 12/24/16   Hillary Bow, MD  isosorbide mononitrate (IMDUR) 30 MG 24 hr tablet Take 1 tablet (30 mg total) by mouth daily. 12/28/16   Alisa Graff, FNP  pantoprazole (PROTONIX) 40 MG tablet Take 1 tablet (40 mg total) by mouth 2 (two) times daily before a meal. 12/23/16   Dustin Flock, MD  pregabalin (LYRICA) 100 MG capsule Take 100 mg by  mouth 3 (three) times daily.    [provider]    Allergies Latex and Gabapentin    Social History Social History   Tobacco Use  . Smoking status: Current Every Day Smoker    Packs/day: 0.25    Types: Cigarettes  . Smokeless tobacco: Never Used  Substance Use Topics  . Alcohol use: No  . Drug use: No    Review of Systems Patient denies headaches, rhinorrhea, blurry vision, numbness, shortness of breath, chest pain, edema, cough, abdominal pain, nausea, vomiting, diarrhea, dysuria, fevers, rashes or hallucinations  unless otherwise stated above in HPI. ____________________________________________   PHYSICAL EXAM:  VITAL SIGNS: Vitals:   06/22/17 1211  BP: (!) 156/90  Pulse: 96  Resp: 16  Temp: 98.7 F (37.1 C)  SpO2: 99%    Constitutional: Alert and oriented.  Eyes: Conjunctivae are normal.  Head: Atraumatic. Nose: No congestion/rhinnorhea. Mouth/Throat: Mucous membranes are moist.   Neck: No stridor. Painless ROM.  Cardiovascular: sternotomy scar appears c/d/i Normal rate, regular rhythm. Grossly normal heart sounds.  Good peripheral circulation. Respiratory: Normal respiratory effort.  No retractions. Lungs CTAB. Gastrointestinal: Soft and nontender. No distention. No abdominal bruits. No CVA tenderness. Genitourinary: deferred Musculoskeletal: No lower extremity tenderness nor edema.  No joint effusions. Neurologic:  Normal speech and language. No gross focal neurologic deficits are appreciated. No facial droop Skin:  Skin is warm, dry and intact. No rash noted. Psychiatric: Mood and affect are normal. Speech and behavior are normal.  ____________________________________________   LABS (all labs ordered are listed, but only abnormal results are displayed)  Results for orders placed or performed during the hospital encounter of 06/22/17 (from the past 24 hour(s))  Basic metabolic panel     Status: Abnormal   Collection Time: 06/22/17 12:13 PM  Result Value Ref Range   Sodium 137 135 - 145 mmol/L   Potassium 3.6 3.5 - 5.1 mmol/L   Chloride 106 101 - 111 mmol/L   CO2 21 (L) 22 - 32 mmol/L   Glucose, Bld 155 (H) 65 - 99 mg/dL   BUN 16 6 - 20 mg/dL   Creatinine, Ser 1.09 (H) 0.44 - 1.00 mg/dL   Calcium 9.0 8.9 - 10.3 mg/dL   GFR calc non Af Amer 53 (L) >60 mL/min   GFR calc Af Amer >60 >60 mL/min   Anion gap 10 5 - 15  CBC     Status: Abnormal   Collection Time: 06/22/17 12:13 PM  Result Value Ref Range   WBC 9.0 3.6 - 11.0 K/uL   RBC 4.51 3.80 - 5.20 MIL/uL   Hemoglobin  11.7 (L) 12.0 - 16.0 g/dL   HCT 35.8 35.0 - 47.0 %   MCV 79.3 (L) 80.0 - 100.0 fL   MCH 26.0 26.0 - 34.0 pg   MCHC 32.8 32.0 - 36.0 g/dL   RDW 16.5 (H) 11.5 - 14.5 %   Platelets 130 (L) 150 - 440 K/uL   ____________________________________________  EKG My review and personal interpretation at Time: 12:14   Indication: post cabg  Rate: 95  Rhythm: sinus Axis: normal Other: lad with rbbb, nonsepcific st abn, no stemi ____________________________________________  RADIOLOGY  I personally reviewed all radiographic images ordered to evaluate for the above acute complaints and reviewed radiology reports and findings.  These findings were personally discussed with the patient.  Please see medical record for radiology report.  ____________________________________________   PROCEDURES  Procedure(s) performed:  Procedures    Critical Care performed: no ____________________________________________  INITIAL IMPRESSION / ASSESSMENT AND PLAN / ED COURSE  Pertinent labs & imaging results that were available during my care of the patient were reviewed by me and considered in my medical decision making (see chart for details).   DDX: chf, acs, dehydration, uti, medicatino noncompliance, pna, copd  JNIYAH DANTUONO is a 62 y.o. who presents to the ED with symptoms as described above.  Patient was.  She has no hypoxia.  Mildly hypertensive but has not been taking her blood pressure medication.  EKG shows no evidence of acute ischemia.  Patient denies any chest pain or pressure primary complaint is deconditioning and difficulty walking.  Has elevated troponin but may be her baseline given her chronic renal sufficiency and status post CABG.  Will further stratify with repeat troponin.  Social work has evaluated patient has been given resources as well as we will arrange to refill her home medications.  Patient will be signed out to oncoming provider pending repeat trop.  Anticipate discharge home  pending stable troponin.      As part of my medical decision making, I reviewed the following data within the [facility] notes reviewed and incorporated, Labs reviewed, notes from prior ED visits.   ____________________________________________   FINAL CLINICAL IMPRESSION(S) / ED DIAGNOSES  Final diagnoses:  Weakness      NEW MEDICATIONS STARTED DURING THIS VISIT:  New Prescriptions   No medications on file     Note:  This document was prepared using Dragon voice recognition software and may include unintentional dictation errors.    Merlyn Lot, MD 06/22/17 [phone]

## 2017-06-22 NOTE — ED Notes (Signed)
Per pt, she does not and never has had a legal guardian. Per pt, no one makes legal decisions for her. Not legal guardian listed by name in chl. Entry made in 2017.

## 2017-06-22 NOTE — ED Notes (Signed)
Pt sitting in lobby eating a doughnut.

## 2017-06-22 NOTE — Care Management Note (Signed)
Case Management Note  Patient Details  Name: [name] MRN: [id number] Date of Birth: [birth year]    Patient presents to the ED with complaints of weakness.  Patient was previously seen by this RNCM in 12/18.  Patient states that she was visiting her sister in [place], and in January had bypass surgery. Patient states that when she was discharged from [facility] that she was not given any of her medication refills, and was advised to come back to [place] where she had insurance.    RNCM contacted [place] medicaid and verified that patient's insurance lapsed on 06/16/17.  Patient states that she was not aware of this. Patient was informed to follow up with Department of social services.   Patient states that she is currently staying with her husband at a friends house.  On nights that they can't stay there, they stay in a hotel.  Patient states that they have some where to stay every night, but its not ideal.   Patient states that she does not have a PCP.  Patient was provided with application to [facility]  And states that she will complete the application turn in the required documentation.  Patient declined for [facility] to set up appointment at [facility] clinic.   Plan is for discharge today from the ED.  Scripts have been faxed to [facility]  And RNCM confirmed they have all available except for Lyrica.  Per MD Lyrica will not be needed at discharge.  Prescriptions were faxed to [facility] .  Patient to pick up tomorrow.  Requested for MD to give today's doses of medications prior to discharge.    Patient states that they no longer have a car of their own, but friends are able to provide transportation.  Patient states that her an her husband each receive $700 a month in disability.    Patient will not qualify to receive home health services.  Patient does not have PCP, and does not meet home bound criteria. RW delivered to room by Corene Cornea with [facility] prior to  discharge.   RNCM signing off.    Subjective/Objective:                    Action/Plan:   Expected Discharge Date:                  Expected Discharge Plan:  Home/Self Care  In-House Referral:     Discharge planning Services  CM Consult, Medication Assistance, [facility]  Post Acute Care Choice:  Durable Medical Equipment Choice offered to:  Patient  DME Arranged:  [place] rolling DME Agency:  [place]:    [place] Agency:     Status of Service:  Completed, signed off  If discussed at [place] of Stay Meetings, dates discussed:    Additional Comments:  Beverly Sessions, RN 06/22/2017, 4:31 PM

## 2017-06-22 NOTE — Discharge Instructions (Signed)
Please follow-up with heart failure clinic cardiology as well.  Return to the ER for worsening symptoms including worsening chest pain, shortness of breath or for any additional questions or concerns.

## 2017-06-22 NOTE — ED Notes (Signed)
Pt states that she has not been able to eat for many days and is having generalized weakness that has occurred since her heart surgery. Pt resting in room.

## 2017-06-22 NOTE — ED Notes (Signed)
Pt not symptomatic while ambulating. Pt O2 saturations 99% on room air while walking to door and around the room. Dr. Quentin Cornwall notified.

## 2017-06-22 NOTE — ED Provider Notes (Signed)
-----------------------------------------   5:48 PM on 06/22/2017 -----------------------------------------  Patient care assumed from Dr. Quentin Cornwall.  Repeat troponin largely unchanged.  Patient will be discharged home.   Harvest Dark, MD 06/22/17 [phone]

## 2017-06-22 NOTE — ED Notes (Signed)
Date and time results received: 06/22/17 2:44 PM  Test: Troponin Critical Value: Trop: 0.10  Name of Provider Notified: Dr. Quentin Cornwall

## 2017-06-22 NOTE — ED Triage Notes (Signed)
Pt comes into the ED via POV c/o weakness and unable to walk.  Patient states she was in another state and had open heart surgery and because of insurance issues she was unable to complete all her PT.  Patient states she has difficulty walking around and eating or taking care of herself.  Patient complains of chronic pain and no medication to help with post surgery.  Patient in NAD at this time with even and unlabored respirations.  Patient brought back to triage while she was eating a donut but states that she cant eat.

## 2017-06-26 ENCOUNTER — Telehealth: Payer: Self-pay | Admitting: Family

## 2017-06-26 ENCOUNTER — Ambulatory Visit: Payer: Self-pay | Admitting: Family

## 2017-06-26 NOTE — Telephone Encounter (Signed)
Patient did not show for her [facility] appointment on 06/26/17. Will attempt to reschedule.

## 2017-08-07 ENCOUNTER — Inpatient Hospital Stay
Admission: EM | Admit: 2017-08-07 | Discharge: 2017-08-09 | DRG: 378 | Disposition: A | Discharge status: Home / Self Care | Payer: Medicaid Other | Attending: Family Medicine | Admitting: Family Medicine

## 2017-08-07 ENCOUNTER — Other Ambulatory Visit: Payer: Self-pay

## 2017-08-07 ENCOUNTER — Encounter: Payer: Self-pay | Admitting: *Deleted

## 2017-08-07 DIAGNOSIS — E119 Type 2 diabetes mellitus without complications: Secondary | ICD-10-CM | POA: Diagnosis present

## 2017-08-07 DIAGNOSIS — K449 Diaphragmatic hernia without obstruction or gangrene: Secondary | ICD-10-CM | POA: Diagnosis present

## 2017-08-07 DIAGNOSIS — Z8249 Family history of ischemic heart disease and other diseases of the circulatory system: Secondary | ICD-10-CM | POA: Diagnosis not present

## 2017-08-07 DIAGNOSIS — Z91040 Latex allergy status: Secondary | ICD-10-CM

## 2017-08-07 DIAGNOSIS — K922 Gastrointestinal hemorrhage, unspecified: Secondary | ICD-10-CM | POA: Diagnosis present

## 2017-08-07 DIAGNOSIS — R531 Weakness: Secondary | ICD-10-CM | POA: Diagnosis not present

## 2017-08-07 DIAGNOSIS — I252 Old myocardial infarction: Secondary | ICD-10-CM | POA: Diagnosis not present

## 2017-08-07 DIAGNOSIS — Z951 Presence of aortocoronary bypass graft: Secondary | ICD-10-CM | POA: Diagnosis not present

## 2017-08-07 DIAGNOSIS — D649 Anemia, unspecified: Secondary | ICD-10-CM

## 2017-08-07 DIAGNOSIS — I5022 Chronic systolic (congestive) heart failure: Secondary | ICD-10-CM | POA: Diagnosis present

## 2017-08-07 DIAGNOSIS — J449 Chronic obstructive pulmonary disease, unspecified: Secondary | ICD-10-CM | POA: Diagnosis present

## 2017-08-07 DIAGNOSIS — K2971 Gastritis, unspecified, with bleeding: Principal | ICD-10-CM | POA: Diagnosis present

## 2017-08-07 DIAGNOSIS — D500 Iron deficiency anemia secondary to blood loss (chronic): Secondary | ICD-10-CM | POA: Diagnosis present

## 2017-08-07 DIAGNOSIS — F17210 Nicotine dependence, cigarettes, uncomplicated: Secondary | ICD-10-CM | POA: Diagnosis present

## 2017-08-07 DIAGNOSIS — Z8711 Personal history of peptic ulcer disease: Secondary | ICD-10-CM

## 2017-08-07 DIAGNOSIS — Z833 Family history of diabetes mellitus: Secondary | ICD-10-CM

## 2017-08-07 DIAGNOSIS — Z794 Long term (current) use of insulin: Secondary | ICD-10-CM

## 2017-08-07 DIAGNOSIS — I214 Non-ST elevation (NSTEMI) myocardial infarction: Secondary | ICD-10-CM

## 2017-08-07 LAB — URINALYSIS, COMPLETE (UACMP) WITH MICROSCOPIC
Bilirubin Urine: NEGATIVE
GLUCOSE, UA: NEGATIVE mg/dL
Hgb urine dipstick: NEGATIVE
Ketones, ur: NEGATIVE mg/dL
LEUKOCYTES UA: NEGATIVE
Nitrite: NEGATIVE
PH: 6 (ref 5.0–8.0)
Protein, ur: 100 mg/dL — AB
SPECIFIC GRAVITY, URINE: 1.02 (ref 1.005–1.030)

## 2017-08-07 LAB — BASIC METABOLIC PANEL
Anion gap: 8 (ref 5–15)
BUN: 22 mg/dL (ref 8–23)
CHLORIDE: 107 mmol/L (ref 98–111)
CO2: 25 mmol/L (ref 22–32)
CREATININE: 1.11 mg/dL — AB (ref 0.44–1.00)
Calcium: 8.8 mg/dL — ABNORMAL LOW (ref 8.9–10.3)
GFR calc Af Amer: >60 mL/min (ref >60)
GFR, EST NON AFRICAN AMERICAN: 52 mL/min — AB (ref >60)
GLUCOSE: 152 mg/dL — AB (ref 70–99)
POTASSIUM: 3.7 mmol/L (ref 3.5–5.1)
SODIUM: 140 mmol/L (ref 135–145)

## 2017-08-07 LAB — CBC
HEMATOCRIT: 19.3 % — AB (ref 35.0–47.0)
Hemoglobin: 6.1 g/dL — ABNORMAL LOW (ref 12.0–16.0)
MCH: 25.5 pg — ABNORMAL LOW (ref 26.0–34.0)
MCHC: 31.6 g/dL — AB (ref 32.0–36.0)
MCV: 80.7 fL (ref 80.0–100.0)
PLATELETS: 194 10*3/uL (ref 150–440)
RBC: 2.4 MIL/uL — ABNORMAL LOW (ref 3.80–5.20)
RDW: 17.4 % — AB (ref 11.5–14.5)
WBC: 9.2 10*3/uL (ref 3.6–11.0)

## 2017-08-07 LAB — PREPARE RBC (CROSSMATCH)

## 2017-08-07 LAB — ABO/RH: ABO/RH(D): O POS

## 2017-08-07 MED ORDER — ACETAMINOPHEN 325 MG PO TABS: 650.0000 mg | ORAL_TABLET | Freq: Four times a day (QID) | ORAL | Status: DC | PRN

## 2017-08-07 MED ORDER — SODIUM CHLORIDE 0.9 % IV SOLN
10.0000 mL/h | Freq: Once | INTRAVENOUS | Status: AC
  Administered 2017-08-07: 10 mL/h via INTRAVENOUS

## 2017-08-07 MED ORDER — SENNOSIDES-DOCUSATE SODIUM 8.6-50 MG PO TABS: 1.0000 | ORAL_TABLET | Freq: Every evening | ORAL | Status: DC | PRN

## 2017-08-07 MED ORDER — FAMOTIDINE IN NACL 20-0.9 MG/50ML-% IV SOLN
20.0000 mg | Freq: Two times a day (BID) | INTRAVENOUS | Status: DC
  Administered 2017-08-07 – 2017-08-08 (×2): 20 mg via INTRAVENOUS
  Filled 2017-08-07 (×2): qty 50

## 2017-08-07 MED ORDER — SODIUM CHLORIDE 0.9 % IV SOLN
8.0000 mg/h | INTRAVENOUS | Status: DC
  Administered 2017-08-07 – 2017-08-09 (×3): 8 mg/h via INTRAVENOUS
  Filled 2017-08-07 (×4): qty 80

## 2017-08-07 MED ORDER — NICOTINE 7 MG/24HR TD PT24
7.0000 mg | MEDICATED_PATCH | Freq: Every day | TRANSDERMAL | Status: DC
  Filled 2017-08-07 (×3): qty 1

## 2017-08-07 MED ORDER — ONDANSETRON HCL 4 MG/2ML IJ SOLN: 4.0000 mg | Freq: Four times a day (QID) | INTRAMUSCULAR | Status: DC | PRN

## 2017-08-07 MED ORDER — ACETAMINOPHEN 650 MG RE SUPP: 650.0000 mg | Freq: Four times a day (QID) | RECTAL | Status: DC | PRN

## 2017-08-07 MED ORDER — PANTOPRAZOLE SODIUM 40 MG IV SOLR: 40.0000 mg | Freq: Two times a day (BID) | INTRAVENOUS | Status: DC

## 2017-08-07 MED ORDER — DEXTROSE-NACL 5-0.9 % IV SOLN
INTRAVENOUS | Status: DC
  Administered 2017-08-07 – 2017-08-09 (×3): via INTRAVENOUS

## 2017-08-07 MED ORDER — ONDANSETRON HCL 4 MG PO TABS: 4.0000 mg | ORAL_TABLET | Freq: Four times a day (QID) | ORAL | Status: DC | PRN

## 2017-08-07 MED ORDER — CARVEDILOL 3.125 MG PO TABS
3.1250 mg | ORAL_TABLET | Freq: Two times a day (BID) | ORAL | Status: DC
  Administered 2017-08-08 – 2017-08-09 (×4): 3.125 mg via ORAL
  Filled 2017-08-07 (×5): qty 1

## 2017-08-07 MED ORDER — SODIUM CHLORIDE 0.9 % IV SOLN
80.0000 mg | Freq: Once | INTRAVENOUS | Status: AC
  Administered 2017-08-07: 80 mg via INTRAVENOUS
  Filled 2017-08-07: qty 80

## 2017-08-07 NOTE — ED Notes (Signed)
First Nurse Note:  Patient states she had open heart surgery in January in [place] and has felt weak and tired since that time.

## 2017-08-07 NOTE — Progress Notes (Signed)
Advanced care plan. Purpose of the Encounter: CODE STATUS Parties in Attendance: Patient and family  Patient's Decision Capacity: Good Subjective/Patient's story: Presented to the emergency room for weakness and fatigue Objective/Medical story Has severe symptomatic anemia  needs PRBC transfusion and work-up for GI bleed Goals of care determination:  advance care directives and goals of care discussed with the patient in detail  Patient wants everything done which includes CPR, intubation and ventilator if the need arises CODE STATUS: Full code Time spent discussing advanced care planning: 16 minutes

## 2017-08-07 NOTE — Progress Notes (Signed)
Patient was having pain at IV site in right arm so IV meds had to be stopped. IV team came to place new IV and then blood was started previous IV meds running will be re-started after blood transfusion completed. Will report to next on-coming nurse.

## 2017-08-07 NOTE — ED Provider Notes (Signed)
[facility] Emergency Department Provider Note   ____________________________________________   I have reviewed the triage vital signs and the nursing notes.   HISTORY  Chief Complaint Weakness   History limited by: Not Limited   HPI [name] is a 62 y.o. female who presents to the emergency department today because of concerns for weakness.  She states it is severe.  She does feel like she is having a hard time ambulating because of the weakness.  This weakness has been present and worsening for months.  Patient denies any chest pain.  She has noticed some black stool recently.   Per medical record review patient has a history of CHF, COPD, DM.  Past Medical History:  Diagnosis Date  . CHF (congestive heart failure) ([place])   . COPD (chronic obstructive pulmonary disease) ([place])   . Diabetes mellitus without complication ([place])   . NSTEMI (non-ST elevated myocardial infarction) [facility])     Patient Active Problem List   Diagnosis Date Noted  . Chronic systolic heart failure ([place]) 12/28/2016  . Diabetes ([place]) 12/28/2016  . Tobacco use 12/28/2016  . NSTEMI (non-ST elevated myocardial infarction) ([place])   . Sepsis ([place]) 12/18/2016  . CHF exacerbation ([place]) 12/04/2015  . PNA (pneumonia) 10/28/2015  . COPD exacerbation ([place]) 08/29/2015    Past Surgical History:  Procedure Laterality Date  . APPENDECTOMY    . CARDIAC CATHETERIZATION    . CHOLECYSTECTOMY    . ESOPHAGOGASTRODUODENOSCOPY N/A 12/19/2016   Procedure: ESOPHAGOGASTRODUODENOSCOPY (EGD);  Surgeon: Lin Landsman, MD;  [facility] ENDOSCOPY;  Service: Gastroenterology;  Laterality: N/A;  . LEFT HEART CATH AND CORONARY ANGIOGRAPHY N/A 12/23/2016   Procedure: LEFT HEART CATH AND CORONARY ANGIOGRAPHY;  Surgeon: Teodoro Spray, MD;  Location: [place] CV LAB;  Service: Cardiovascular;  Laterality: N/A;    Prior to Admission medications   Medication Sig Start Date End Date  Taking? Authorizing Provider  atorvastatin (LIPITOR) 40 MG tablet Take 1 tablet (40 mg total) by mouth daily. 06/22/17 10/20/17  Merlyn Lot, MD  blood glucose meter kit and supplies KIT Dispense based on patient and insurance preference. Use up to four times daily as directed. (FOR ICD-9 250.00, 250.01). 12/24/16   Hillary Bow, MD  carvedilol (COREG) 3.125 MG tablet Take 1 tablet (3.125 mg total) by mouth 2 (two) times daily with a meal. 06/22/17   Merlyn Lot, MD  Insulin Glargine (LANTUS SOLOSTAR) 100 UNIT/ML Solostar Pen Inject 15 Units into the skin daily. 06/22/17   Merlyn Lot, MD  Insulin Pen Needle 32G X 5 MM MISC 1 Units by Does not apply route daily. 12/24/16   Hillary Bow, MD  isosorbide mononitrate (IMDUR) 30 MG 24 hr tablet Take 1 tablet (30 mg total) by mouth daily. 06/22/17   Merlyn Lot, MD  pantoprazole (PROTONIX) 40 MG tablet Take 1 tablet (40 mg total) by mouth 2 (two) times daily before a meal. 12/23/16   Dustin Flock, MD  pregabalin (LYRICA) 100 MG capsule Take 1 capsule (100 mg total) by mouth 3 (three) times daily. 06/22/17 07/22/17  Merlyn Lot, MD    Allergies Latex and Gabapentin  Family History  Problem Relation Age of Onset  . Hypertension Mother     Social History Social History   Tobacco Use  . Smoking status: Former Smoker    Packs/day: 0.25    Types: Cigarettes  . Smokeless tobacco: Never Used  Substance Use Topics  . Alcohol use: No  . Drug use: No  Review of Systems Constitutional: No fever/chills Eyes: No visual changes. ENT: No sore throat. Cardiovascular: Denies chest pain. Respiratory: Denies shortness of breath. Gastrointestinal: No abdominal pain.  Black stool. Genitourinary: Negative for dysuria. Musculoskeletal: Negative for back pain. Skin: Negative for rash. Neurological: Negative for headaches, focal weakness or numbness.  ____________________________________________   PHYSICAL EXAM:  VITAL  SIGNS: ED Triage Vitals [08/07/17 1302]  Enc Vitals Group     BP 135/66     Pulse Rate 84     Resp 16     Temp 98.1 F (36.7 C)     Temp Source Oral     SpO2 100 %     Weight 160 lb (72.6 kg)     Height      Head Circumference      Peak Flow      Pain Score 7   Constitutional: Alert and oriented.  Eyes: Conjunctivae are normal.  ENT      Head: Normocephalic and atraumatic.      Nose: No congestion/rhinnorhea.      Mouth/Throat: Mucous membranes are moist.      Neck: No stridor. Hematological/Lymphatic/Immunilogical: No cervical lymphadenopathy. Cardiovascular: Normal rate, regular rhythm.  No murmurs, rubs, or gallops.  Respiratory: Normal respiratory effort without tachypnea nor retractions. Breath sounds are clear and equal bilaterally. No wheezes/rales/rhonchi. Gastrointestinal: Soft and non tender. No rebound. No guarding.  Genitourinary: Deferred Musculoskeletal: Normal range of motion in all extremities. No lower extremity edema. Neurologic:  Normal speech and language. No gross focal neurologic deficits are appreciated.  Skin:  Skin is warm, dry and intact. No rash noted. Psychiatric: Mood and affect are normal. Speech and behavior are normal. Patient exhibits appropriate insight and judgment.  ____________________________________________    LABS (pertinent positives/negatives)  BMP na 140, k 3.7, glu 152, cr 1.11 CBC wbc 9.2, hgb 6.1, plt 194 UA cloudy, 0-5 wbc, rare bacteria, >50 squamous   ____________________________________________   EKG  I, Nance Pear, attending physician, personally viewed and interpreted this EKG  EKG Time: 1306 Rate: 88 Rhythm: sinus rhythm Axis: left axis deviation Intervals: qtc 542 QRS: RBBB ST changes: no st elevation Impression: abnormal ekg   ____________________________________________     RADIOLOGY  None  ____________________________________________   PROCEDURES  Procedures  ____________________________________________   INITIAL IMPRESSION / ASSESSMENT AND PLAN / ED COURSE  Pertinent labs & imaging results that were available during my care of the patient were reviewed by me and considered in my medical decision making (see chart for details).   Patient presented to the emergency department today because of concerns for weakness.  Patient's blood work shows a hemoglobin of 6.1.  Patient was guaiac positive.  Will start Protonix and I did consent patient for blood transfusion.  Will plan on admission.  ____________________________________   FINAL CLINICAL IMPRESSION(S) / ED DIAGNOSES  Final diagnoses:  Weakness  Anemia, unspecified type  Gastrointestinal hemorrhage, unspecified gastrointestinal hemorrhage type     Note: This dictation was prepared with Dragon dictation. Any transcriptional errors that result from this process are unintentional     Nance Pear, MD 08/07/17 1447

## 2017-08-07 NOTE — Progress Notes (Signed)
   08/07/17 1800  Clinical Encounter Type  Visited With Patient  Visit Type Initial;Spiritual support  Referral From Patient  Spiritual Encounters  Spiritual Needs Emotional;Prayer  [place] responded to patient request for visit; [place] offered prayer along with spiritual and emotional support.

## 2017-08-07 NOTE — ED Notes (Signed)
Type and screen sent at this time.

## 2017-08-07 NOTE — H&P (Addendum)
[facility] [place] NAME: [name]    MR#:  [id number]  DATE OF BIRTH:  1955-04-16  DATE OF ADMISSION:  08/07/2017  PRIMARY CARE PHYSICIAN: [place]   REQUESTING/REFERRING PHYSICIAN:   CHIEF COMPLAINT:   Chief Complaint  Patient presents with  . Weakness    HISTORY OF PRESENT ILLNESS: [name]  is a 62 y.o. female with a known history of congestive heart failure, COPD, diabetes mellitus type 2, non-STEMI presented to the emergency room for weakness and fatigue.  Patient has generalized weakness.  She appears very tired and gets fatigued easily whenever she moves around.  She was evaluated in the emergency room was found to have hemoglobin around 6.  Hemoglobin around 1 month was 11.  Patient complains of black tarry stool.  No complains of any vomiting of blood or coughing of blood patient never had any EGD and colonoscopy in the past.. Hospitalist service was consulted for further care.  PAST MEDICAL HISTORY:   Past Medical History:  Diagnosis Date  . CHF (congestive heart failure) ([place])   . COPD (chronic obstructive pulmonary disease) ([place])   . Diabetes mellitus without complication ([place])   . NSTEMI (non-ST elevated myocardial infarction) ([place])     PAST SURGICAL HISTORY:  Past Surgical History:  Procedure Laterality Date  . APPENDECTOMY    . CARDIAC CATHETERIZATION    . CHOLECYSTECTOMY    . ESOPHAGOGASTRODUODENOSCOPY N/A 12/19/2016   Procedure: ESOPHAGOGASTRODUODENOSCOPY (EGD);  Surgeon: Lin Landsman, MD;  Location: [facility] ENDOSCOPY;  Service: Gastroenterology;  Laterality: N/A;  . LEFT HEART CATH AND CORONARY ANGIOGRAPHY N/A 12/23/2016   Procedure: LEFT HEART CATH AND CORONARY ANGIOGRAPHY;  Surgeon: Teodoro Spray, MD;  Location: [place] CV LAB;  Service: Cardiovascular;  Laterality: N/A;    SOCIAL HISTORY:  Social History   Tobacco Use  . Smoking status: Current Some Day  Smoker    Packs/day: 0.25    Types: Cigarettes  . Smokeless tobacco: Never Used  Substance Use Topics  . Alcohol use: No    FAMILY HISTORY:  Family History  Problem Relation Age of Onset  . Hypertension Mother   . Diabetes Mellitus II Mother   . Heart disease Father     DRUG ALLERGIES:  Allergies  Allergen Reactions  . Latex Anaphylaxis and Rash  . Gabapentin Nausea And Vomiting    REVIEW OF SYSTEMS:   CONSTITUTIONAL: No fever,has fatigue and weakness.  EYES: No blurred or double vision.  EARS, NOSE, AND THROAT: No tinnitus or ear pain.  RESPIRATORY: No cough, shortness of breath, wheezing or hemoptysis.  CARDIOVASCULAR: No chest pain, orthopnea, edema.  GASTROINTESTINAL: No nausea, vomiting, diarrhea or abdominal pain.  GENITOURINARY: No dysuria, hematuria.  ENDOCRINE: No polyuria, nocturia,  HEMATOLOGY: No anemia, easy bruising or bleeding SKIN: No rash or lesion. MUSCULOSKELETAL: No joint pain or arthritis.   NEUROLOGIC: No tingling, numbness, weakness.  PSYCHIATRY: No anxiety or depression.   MEDICATIONS AT HOME:  Prior to Admission medications   Medication Sig Start Date End Date Taking? Authorizing Provider  atorvastatin (LIPITOR) 40 MG tablet Take 1 tablet (40 mg total) by mouth daily. Patient not taking: Reported on 08/07/2017 06/22/17 10/20/17  Merlyn Lot, MD  blood glucose meter kit and supplies KIT Dispense based on patient and insurance preference. Use up to four times daily as directed. (FOR ICD-9 250.00, 250.01). Patient not taking: Reported on 08/07/2017 12/24/16   Hillary Bow, MD  carvedilol (COREG) 3.125 MG tablet Take 1 tablet (3.125 mg total) by mouth 2 (two) times daily with a meal. Patient not taking: Reported on 08/07/2017 06/22/17   Merlyn Lot, MD  Insulin Glargine (LANTUS SOLOSTAR) 100 UNIT/ML Solostar Pen Inject 15 Units into the skin daily. Patient not taking: Reported on 08/07/2017 06/22/17   Merlyn Lot, MD  Insulin Pen Needle  32G X 5 MM MISC 1 Units by Does not apply route daily. Patient not taking: Reported on 08/07/2017 12/24/16   Hillary Bow, MD  isosorbide mononitrate (IMDUR) 30 MG 24 hr tablet Take 1 tablet (30 mg total) by mouth daily. Patient not taking: Reported on 08/07/2017 06/22/17   Merlyn Lot, MD  pantoprazole (PROTONIX) 40 MG tablet Take 1 tablet (40 mg total) by mouth 2 (two) times daily before a meal. Patient not taking: Reported on 08/07/2017 12/23/16   Dustin Flock, MD  pregabalin (LYRICA) 100 MG capsule Take 1 capsule (100 mg total) by mouth 3 (three) times daily. Patient not taking: Reported on 08/07/2017 06/22/17 07/22/17  Merlyn Lot, MD      PHYSICAL EXAMINATION:   VITAL SIGNS: Blood pressure 135/66, pulse 84, temperature 98.1 F (36.7 C), temperature source Oral, resp. rate 16, weight 72.6 kg (160 lb), SpO2 100 %.  GENERAL:  62 y.o.-year-old patient lying in the bed with no acute distress.  EYES: Pupils equal, round, reactive to light and accommodation. No scleral icterus. Extraocular muscles intact.  HEENT: Head atraumatic, normocephalic. Oropharynx and nasopharynx clear.  NECK:  Supple, no jugular venous distention. No thyroid enlargement, no tenderness.  LUNGS: Normal breath sounds bilaterally, no wheezing, rales,rhonchi or crepitation. No use of accessory muscles of respiration.  CARDIOVASCULAR: S1, S2 normal. No murmurs, rubs, or gallops.  ABDOMEN: Soft, nontender, nondistended. Bowel sounds present. No organomegaly or mass.  EXTREMITIES: No pedal edema, cyanosis, or clubbing.  NEUROLOGIC: Cranial nerves II through XII are intact. Muscle strength 5/5 in all extremities. Sensation intact. Gait not checked.  PSYCHIATRIC: The patient is alert and oriented x 3.  SKIN: No obvious rash, lesion, or ulcer.   LABORATORY PANEL:   CBC Recent Labs  Lab 08/07/17 1308  WBC 9.2  HGB 6.1*  HCT 19.3*  PLT 194  MCV 80.7  MCH 25.5*  MCHC 31.6*  RDW 17.4*    ------------------------------------------------------------------------------------------------------------------  Chemistries  Recent Labs  Lab 08/07/17 1308  NA 140  K 3.7  CL 107  CO2 25  GLUCOSE 152*  BUN 22  CREATININE 1.11*  CALCIUM 8.8*   ------------------------------------------------------------------------------------------------------------------ estimated creatinine clearance is 52.4 mL/min (A) (by C-G formula based on SCr of 1.11 mg/dL (H)). ------------------------------------------------------------------------------------------------------------------ No results for input(s): TSH, T4TOTAL, T3FREE, THYROIDAB in the last 72 hours.  Invalid input(s): FREET3   Coagulation profile No results for input(s): INR, PROTIME in the last 168 hours. ------------------------------------------------------------------------------------------------------------------- No results for input(s): DDIMER in the last 72 hours. -------------------------------------------------------------------------------------------------------------------  Cardiac Enzymes No results for input(s): CKMB, TROPONINI, MYOGLOBIN in the last 168 hours.  Invalid input(s): CK ------------------------------------------------------------------------------------------------------------------ Invalid input(s): POCBNP  ---------------------------------------------------------------------------------------------------------------  Urinalysis    Component Value Date/Time   COLORURINE YELLOW (A) 08/07/2017 1308   APPEARANCEUR CLOUDY (A) 08/07/2017 1308   APPEARANCEUR Clear 08/03/2013 2138   LABSPEC 1.020 08/07/2017 1308   LABSPEC 1.028 08/03/2013 2138   PHURINE 6.0 08/07/2017 1308   GLUCOSEU NEGATIVE 08/07/2017 1308   GLUCOSEU Negative 08/03/2013 2138   HGBUR NEGATIVE 08/07/2017 1308   BILIRUBINUR NEGATIVE 08/07/2017 1308   BILIRUBINUR Negative 08/03/2013 2138   KETONESUR NEGATIVE  08/07/2017 1308    PROTEINUR 100 (A) 08/07/2017 1308   UROBILINOGEN 0.2 09/27/2006 2132   NITRITE NEGATIVE 08/07/2017 1308   LEUKOCYTESUR NEGATIVE 08/07/2017 1308   LEUKOCYTESUR Negative 08/03/2013 2138     RADIOLOGY: No results found.  EKG: Orders placed or performed during the hospital encounter of 08/07/17  . ED EKG  . ED EKG  . EKG 12-Lead  . EKG 12-Lead    IMPRESSION AND PLAN:  62 year old female patient with history of diabetes mellitus type 2, COPD, non-STEMI, congestive heart failure presented to the emergency room with fatigue and weakness.  -Acute severe symptomatic anemia Transfuse PRBC IV  -Acute gastrointestinal bleeding PRBC transfusion monitor hemoglobin hematocrit IV Pepcid 20 mg every 12 hourly Gastroenterology consultation for possible EGD and colonoscopy  -COPD stable 60 3 times daily  -Tobacco abuse Tobacco cessation counseled to the patient Nicotine patch offered  -DVT prophylaxis and sequential compression device to lower extremities  All the records are reviewed and case discussed with ED provider. Management plans discussed with the patient, family and they are in agreement.  CODE STATUS:Full code Code Status History    Date Active Date Inactive Code Status Order ID Comments User Context   12/18/2016 1146 12/26/2016 2032 Full Code [id number]  Vaughan Basta, MD Inpatient   12/18/2016 0959 12/18/2016 1146 Full Code [id number]  Vaughan Basta, MD ED   12/04/2015 0509 12/07/2015 1756 Full Code [id number]  Holley Raring, NP ED   10/28/2015 1640 10/29/2015 1711 Full Code [id number]  Bettey Costa, MD Inpatient   08/29/2015 1438 08/30/2015 1750 Full Code [id number]  Hugelmeyer, Ubaldo Glassing, DO Inpatient       TOTAL TIME TAKING CARE OF THIS PATIENT: 53 minutes.    Saundra Shelling M.D on 08/07/2017 at 2:59 PM  Between 7am to 6pm - Pager - [phone]  After 6pm go to [email or web address] - password [email or web address] [facility]  Office   [phone]  CC: Primary care physician; [place] [place] [facility]

## 2017-08-07 NOTE — ED Triage Notes (Signed)
Pt reports generalized weakness and decreased appetite throughout the year. Pt had open heart surgery in January but is unable to verbalize the exact reason for the surgery. Pt has lost over 100 lbs since January and denies the weakness has worsened recently and states it has been constant since the surgery.

## 2017-08-07 NOTE — ED Notes (Signed)
Hemoccult preformed by EDP. Pt admits to Hemorid, tolerated. RN will monitor.

## 2017-08-07 NOTE — ED Notes (Signed)
Patient transported to room 223.

## 2017-08-08 ENCOUNTER — Encounter
Admission: EM | Disposition: A | Discharge status: Home / Self Care | Payer: Self-pay | Source: Home / Self Care | Attending: Family Medicine

## 2017-08-08 ENCOUNTER — Inpatient Hospital Stay: Payer: Medicaid Other | Admitting: Certified Registered Nurse Anesthetist

## 2017-08-08 ENCOUNTER — Encounter: Payer: Self-pay | Admitting: *Deleted

## 2017-08-08 DIAGNOSIS — K922 Gastrointestinal hemorrhage, unspecified: Secondary | ICD-10-CM

## 2017-08-08 DIAGNOSIS — R531 Weakness: Secondary | ICD-10-CM

## 2017-08-08 DIAGNOSIS — D649 Anemia, unspecified: Secondary | ICD-10-CM

## 2017-08-08 HISTORY — PX: ESOPHAGOGASTRODUODENOSCOPY (EGD) WITH PROPOFOL: SHX5813

## 2017-08-08 LAB — BASIC METABOLIC PANEL
Anion gap: 5 (ref 5–15)
BUN: 22 mg/dL (ref 8–23)
CALCIUM: 8.5 mg/dL — AB (ref 8.9–10.3)
CO2: 25 mmol/L (ref 22–32)
CREATININE: 1.03 mg/dL — AB (ref 0.44–1.00)
Chloride: 112 mmol/L — ABNORMAL HIGH (ref 98–111)
GFR calc Af Amer: >60 mL/min (ref >60)
GFR calc non Af Amer: 57 mL/min — ABNORMAL LOW (ref >60)
GLUCOSE: 152 mg/dL — AB (ref 70–99)
Potassium: 3.8 mmol/L (ref 3.5–5.1)
Sodium: 142 mmol/L (ref 135–145)

## 2017-08-08 LAB — HEMOGLOBIN AND HEMATOCRIT, BLOOD
HCT: 21 % — ABNORMAL LOW (ref 35.0–47.0)
HCT: 20.2 % — ABNORMAL LOW (ref 35.0–47.0)
HCT: 25.9 % — ABNORMAL LOW (ref 35.0–47.0)
HEMOGLOBIN: 6.7 g/dL — AB (ref 12.0–16.0)
HEMOGLOBIN: 8.6 g/dL — AB (ref 12.0–16.0)
Hemoglobin: 7.1 g/dL — ABNORMAL LOW (ref 12.0–16.0)

## 2017-08-08 LAB — GLUCOSE, CAPILLARY: GLUCOSE-CAPILLARY: 175 mg/dL — AB (ref 70–99)

## 2017-08-08 LAB — PREPARE RBC (CROSSMATCH)

## 2017-08-08 LAB — MRSA PCR SCREENING: MRSA BY PCR: POSITIVE — AB

## 2017-08-08 SURGERY — ESOPHAGOGASTRODUODENOSCOPY (EGD) WITH PROPOFOL
Anesthesia: General

## 2017-08-08 MED ORDER — SODIUM CHLORIDE 0.9 % IV SOLN
INTRAVENOUS | Status: DC | PRN
  Administered 2017-08-08: 13:00:00 via INTRAVENOUS

## 2017-08-08 MED ORDER — LIDOCAINE HCL (PF) 2 % IJ SOLN
INTRAMUSCULAR | Status: AC
  Filled 2017-08-08: qty 10

## 2017-08-08 MED ORDER — VITAMIN C 500 MG PO TABS
250.0000 mg | ORAL_TABLET | Freq: Two times a day (BID) | ORAL | Status: DC
  Administered 2017-08-08 – 2017-08-09 (×2): 250 mg via ORAL
  Filled 2017-08-08 (×3): qty 0.5

## 2017-08-08 MED ORDER — ACETAMINOPHEN 325 MG PO TABS
650.0000 mg | ORAL_TABLET | Freq: Once | ORAL | Status: AC
  Administered 2017-08-08: 650 mg via ORAL
  Filled 2017-08-08: qty 2

## 2017-08-08 MED ORDER — MIDAZOLAM HCL 2 MG/2ML IJ SOLN
INTRAMUSCULAR | Status: AC
  Filled 2017-08-08: qty 2

## 2017-08-08 MED ORDER — ENSURE ENLIVE PO LIQD: 237.0000 mL | Freq: Two times a day (BID) | ORAL | Status: DC

## 2017-08-08 MED ORDER — FUROSEMIDE 10 MG/ML IJ SOLN
20.0000 mg | Freq: Once | INTRAMUSCULAR | Status: AC
  Administered 2017-08-08: 20 mg via INTRAVENOUS
  Filled 2017-08-08: qty 4

## 2017-08-08 MED ORDER — MIDAZOLAM HCL 2 MG/2ML IJ SOLN
INTRAMUSCULAR | Status: DC | PRN
  Administered 2017-08-08: 2 mg via INTRAVENOUS

## 2017-08-08 MED ORDER — PROPOFOL 10 MG/ML IV BOLUS
INTRAVENOUS | Status: DC | PRN
  Administered 2017-08-08: 25 mg via INTRAVENOUS

## 2017-08-08 MED ORDER — SODIUM CHLORIDE 0.9% IV SOLUTION
Freq: Once | INTRAVENOUS | Status: AC
  Administered 2017-08-08: 09:00:00 via INTRAVENOUS

## 2017-08-08 MED ORDER — PROPOFOL 500 MG/50ML IV EMUL
INTRAVENOUS | Status: DC | PRN
  Administered 2017-08-08: 100 ug/kg/min via INTRAVENOUS

## 2017-08-08 MED ORDER — FENTANYL CITRATE (PF) 100 MCG/2ML IJ SOLN: 25.0000 ug | INTRAMUSCULAR | Status: DC | PRN

## 2017-08-08 MED ORDER — DIPHENHYDRAMINE HCL 25 MG PO CAPS
25.0000 mg | ORAL_CAPSULE | Freq: Once | ORAL | Status: AC
  Administered 2017-08-08: 25 mg via ORAL
  Filled 2017-08-08: qty 1

## 2017-08-08 MED ORDER — ADULT MULTIVITAMIN W/MINERALS CH
1.0000 | ORAL_TABLET | Freq: Every day | ORAL | Status: DC
  Administered 2017-08-08 – 2017-08-09 (×2): 1 via ORAL
  Filled 2017-08-08 (×2): qty 1

## 2017-08-08 MED ORDER — PROPOFOL 500 MG/50ML IV EMUL
INTRAVENOUS | Status: AC
  Filled 2017-08-08: qty 50

## 2017-08-08 MED ORDER — MUPIROCIN 2 % EX OINT
1.0000 "application " | TOPICAL_OINTMENT | Freq: Two times a day (BID) | CUTANEOUS | Status: DC
  Administered 2017-08-08 – 2017-08-09 (×2): 1 via NASAL
  Filled 2017-08-08: qty 22

## 2017-08-08 MED ORDER — CHLORHEXIDINE GLUCONATE CLOTH 2 % EX PADS
6.0000 | MEDICATED_PAD | Freq: Every day | CUTANEOUS | Status: DC
  Administered 2017-08-09: 6 via TOPICAL

## 2017-08-08 MED ORDER — SODIUM CHLORIDE 0.9% IV SOLUTION: Freq: Once | INTRAVENOUS | Status: AC

## 2017-08-08 MED ORDER — LIDOCAINE HCL (CARDIAC) PF 100 MG/5ML IV SOSY
PREFILLED_SYRINGE | INTRAVENOUS | Status: DC | PRN
  Administered 2017-08-08: 60 mg via INTRAVENOUS

## 2017-08-08 MED ORDER — ONDANSETRON HCL 4 MG/2ML IJ SOLN: 4.0000 mg | Freq: Once | INTRAMUSCULAR | Status: DC | PRN

## 2017-08-08 NOTE — Progress Notes (Signed)
Verbal order from prime doctor to discontinue duplicate orders of blood transfusion, pt due to have one unit of RBCs at this time, verbal order for hgb to be checked after transfusion. Will continue to monitor pt.   Dorianna Mckiver [employer]

## 2017-08-08 NOTE — Progress Notes (Signed)
[place] [facility] [place] NAME: [name]    MR#:  [id number]  DATE OF BIRTH:  20-Nov-1955  SUBJECTIVE:  CHIEF COMPLAINT:   Chief Complaint  Patient presents with  . Weakness   Patient without complaint, EGD noted for gastritis/recommended outpatient colonoscopy status post discharge REVIEW OF SYSTEMS:  CONSTITUTIONAL: No fever, fatigue or weakness.  EYES: No blurred or double vision.  EARS, NOSE, AND THROAT: No tinnitus or ear pain.  RESPIRATORY: No cough, shortness of breath, wheezing or hemoptysis.  CARDIOVASCULAR: No chest pain, orthopnea, edema.  GASTROINTESTINAL: No nausea, vomiting, diarrhea or abdominal pain.  GENITOURINARY: No dysuria, hematuria.  ENDOCRINE: No polyuria, nocturia,  HEMATOLOGY: No anemia, easy bruising or bleeding SKIN: No rash or lesion. MUSCULOSKELETAL: No joint pain or arthritis.   NEUROLOGIC: No tingling, numbness, weakness.  PSYCHIATRY: No anxiety or depression.   ROS  DRUG ALLERGIES:   Allergies  Allergen Reactions  . Latex Anaphylaxis and Rash  . Gabapentin Nausea And Vomiting    VITALS:  Blood pressure 121/63, pulse 75, temperature (!) 97 F (36.1 C), resp. rate 17, weight 72.6 kg (160 lb), SpO2 96 %.  PHYSICAL EXAMINATION:  GENERAL:  62 y.o.-year-old patient lying in the bed with no acute distress.  EYES: Pupils equal, round, reactive to light and accommodation. No scleral icterus. Extraocular muscles intact.  HEENT: Head atraumatic, normocephalic. Oropharynx and nasopharynx clear.  NECK:  Supple, no jugular venous distention. No thyroid enlargement, no tenderness.  LUNGS: Normal breath sounds bilaterally, no wheezing, rales,rhonchi or crepitation. No use of accessory muscles of respiration.  CARDIOVASCULAR: S1, S2 normal. No murmurs, rubs, or gallops.  ABDOMEN: Soft, nontender, nondistended. Bowel sounds present. No organomegaly or mass.  EXTREMITIES: No pedal edema, cyanosis, or clubbing.   NEUROLOGIC: Cranial nerves II through XII are intact. Muscle strength 5/5 in all extremities. Sensation intact. Gait not checked.  PSYCHIATRIC: The patient is alert and oriented x 3.  SKIN: No obvious rash, lesion, or ulcer.   Physical Exam LABORATORY PANEL:   CBC Recent Labs  Lab 08/07/17 1308  08/08/17 0411  WBC 9.2  --   --   HGB 6.1*   < > 6.7*  HCT 19.3*   < > 20.2*  PLT 194  --   --    < > = values in this interval not displayed.   ------------------------------------------------------------------------------------------------------------------  Chemistries  Recent Labs  Lab 08/08/17 0411  NA 142  K 3.8  CL 112*  CO2 25  GLUCOSE 152*  BUN 22  CREATININE 1.03*  CALCIUM 8.5*   ------------------------------------------------------------------------------------------------------------------  Cardiac Enzymes No results for input(s): TROPONINI in the last 168 hours. ------------------------------------------------------------------------------------------------------------------  RADIOLOGY:  No results found.  ASSESSMENT AND PLAN:  62 year old female patient with history of diabetes mellitus type 2, COPD, non-STEMI, congestive heart failure presented to the emergency room with fatigue and weakness.  *Acute severe symptomatic anemia due to blood loss Improved Transfuse another unit PRBC, strict I&O monitoring, CBC in the morning, and transfuse as needed  *Acute gastrointestinal bleeding Etiology unknown EGD done the day by gastroenterology noted for gastritis/recommended outpatient colonoscopy status post discharge Continue PPI, strict I&O monitoring, CBC daily, transfuse as needed  *COPD without exacerbation stable  Continue breathing treatments as needed  *Tobacco abuse, chronic Stable Tobacco cessation counseled to the patient Nicotine patch offered  Disposition to home on tomorrow provided hemoglobin is stable and GI bleeding has  stopped  All the records are reviewed and case discussed  with Care Management/Social Workerr. Management plans discussed with the patient, family and they are in agreement.  CODE STATUS: full  TOTAL TIME TAKING CARE OF THIS PATIENT: 35 minutes.     POSSIBLE D/C IN 1 DAYS, DEPENDING ON CLINICAL CONDITION.   Avel Peace Salary M.D on 08/08/2017   Between 7am to 6pm - Pager - [phone]  After 6pm go to [email or web address] - password [email or web address] [facility]  Office  [phone]  CC: Primary care physician; [facility]  Note: This dictation was prepared with Dragon dictation along with smaller phrase technology. Any transcriptional errors that result from this process are unintentional.

## 2017-08-08 NOTE — Progress Notes (Signed)
Blood transfusion was completed in endo. Pt is now in 223. VSS. hgb & hct lab draw ordered.   Bailee Metter [employer]

## 2017-08-08 NOTE — Op Note (Signed)
[facility] Gastroenterology Patient Name: [name] Procedure Date: 08/08/2017 12:34 PM MRN: [id number] Account #: [id number] Date of Birth: [birth year] Admit Type: Inpatient Age: 62 Room: [facility] ENDO ROOM 4 Gender: Female Note Status: Finalized Procedure:            Upper GI endoscopy Indications:          Iron deficiency anemia secondary to chronic blood loss Providers:            [name] MD, MD Referring MD:         Rozell Searing, [place] Medicines:            Propofol per Anesthesia Complications:        No immediate complications. Procedure:            Pre-Anesthesia Assessment:                       - Prior to the procedure, a History and Physical was                        performed, and patient medications and allergies were                        reviewed. The patient's tolerance of previous                        anesthesia was also reviewed. The risks and benefits of                        the procedure and the sedation options and risks were                        discussed with the patient. All questions were                        answered, and informed consent was obtained. Prior                        Anticoagulants: The patient has taken no previous                        anticoagulant or antiplatelet agents. ASA Grade                        Assessment: II - A patient with mild systemic disease.                        After reviewing the risks and benefits, the patient was                        deemed in satisfactory condition to undergo the                        procedure.                       After obtaining informed consent, the endoscope was                        passed under direct vision. Throughout the procedure,  the patient's blood pressure, pulse, and oxygen                        saturations were monitored continuously. The Endoscope                        was introduced through the mouth, and  advanced to the                        second part of duodenum. The upper GI endoscopy was                        accomplished without difficulty. The patient tolerated                        the procedure well. Findings:      A small hiatal hernia was present.      Mild inflammation characterized by erythema was found in the gastric       antrum.      The examined duodenum was normal. Impression:           - Small hiatal hernia.                       - Gastritis.                       - Normal examined duodenum.                       - No specimens collected. Recommendation:       - Return patient to hospital ward for ongoing care.                       - Advance diet as tolerated.                       - Continue present medications.                       - The patient has been recommended to have a                        colonoscopy dsuring the last admission for the same                        reason.                       Would recommend an outpatient colonoscopy. Procedure Code(s):    --- Professional ---                       [phone], Esophagogastroduodenoscopy, flexible, transoral;                        diagnostic, including collection of specimen(s) by                        brushing or washing, when performed (separate procedure) Diagnosis Code(s):    --- Professional ---                       D50.0, Iron deficiency anemia  secondary to blood loss                        (chronic)                       K29.70, Gastritis, unspecified, without bleeding CPT copyright 2017 American Medical Association. All rights reserved. The codes documented in this report are preliminary and upon coder review may  be revised to meet current compliance requirements. [name] MD, MD 08/08/2017 1:01:41 PM This report has been signed electronically. Number of Addenda: 0 Note Initiated On: 08/08/2017 12:34 PM      [facility]

## 2017-08-08 NOTE — Anesthesia Post-op Follow-up Note (Signed)
Anesthesia QCDR form completed.        

## 2017-08-08 NOTE — Transfer of Care (Signed)
Immediate Anesthesia Transfer of Care Note  Patient: [name]  Procedure(s) Performed: ESOPHAGOGASTRODUODENOSCOPY (EGD) WITH PROPOFOL (N/A )  Patient Location: PACU  Anesthesia Type:General  Level of Consciousness: sedated  Airway & Oxygen Therapy: Patient Spontanous Breathing and Patient connected to nasal cannula oxygen  Post-op Assessment: Report given to RN and Post -op Vital signs reviewed and stable  Post vital signs: Reviewed and stable  Last Vitals:  Vitals Value Taken Time  BP 124/64 08/08/2017  1:04 PM  Temp 36.1 C 08/08/2017  1:04 PM  Pulse 77 08/08/2017  1:05 PM  Resp 22 08/08/2017  1:05 PM  SpO2 99 % 08/08/2017  1:05 PM  Vitals shown include unvalidated device data.  Last Pain:  Vitals:   08/08/17 1233  TempSrc: Tympanic  PainSc: 0-No pain         Complications: No apparent anesthesia complications

## 2017-08-08 NOTE — Progress Notes (Signed)
Verbal order for pt to receive IV lasix 20 mg once after the blood transfusion is completed. RN transported with pt to endo for EGD. First unit of RBCs still has 30 more minutes. RN relayed message to receiving nurse in endo about lasix administration.   Mildreth Reek [employer]

## 2017-08-08 NOTE — Anesthesia Preprocedure Evaluation (Signed)
Anesthesia Evaluation  Patient identified by MRN, date of birth, ID band Patient awake    Reviewed: Allergy & Precautions, H&P , NPO status , Patient's Chart, lab work & pertinent test results, reviewed documented beta blocker date and time   Airway Mallampati: II   Neck ROM: full    Dental  (+) Poor Dentition   Pulmonary neg pulmonary ROS, pneumonia, COPD, Current Smoker,    Pulmonary exam normal        Cardiovascular Exercise Tolerance: Poor + Past MI, + CABG and +CHF  negative cardio ROS Normal cardiovascular exam Rhythm:regular Rate:Normal     Neuro/Psych negative neurological ROS  negative psych ROS   GI/Hepatic negative GI ROS, Neg liver ROS,   Endo/Other  negative endocrine ROSdiabetes, Well Controlled, Type 1, Insulin Dependent  Renal/GU negative Renal ROS  negative genitourinary   Musculoskeletal   Abdominal   Peds  Hematology negative hematology ROS (+)   Anesthesia Other Findings Past Medical History: No date: CHF (congestive heart failure) (HCC) No date: COPD (chronic obstructive pulmonary disease) (HCC) No date: Diabetes mellitus without complication (HCC) No date: NSTEMI (non-ST elevated myocardial infarction) ([place]) Past Surgical History: No date: APPENDECTOMY No date: CARDIAC CATHETERIZATION No date: CHOLECYSTECTOMY 12/19/2016: ESOPHAGOGASTRODUODENOSCOPY; N/A     Comment:  Procedure: ESOPHAGOGASTRODUODENOSCOPY (EGD);  Surgeon:               Lin Landsman, MD;  Location: [facility] ENDOSCOPY;                Service: Gastroenterology;  Laterality: N/A; 12/23/2016: LEFT HEART CATH AND CORONARY ANGIOGRAPHY; N/A     Comment:  Procedure: LEFT HEART CATH AND CORONARY ANGIOGRAPHY;                Surgeon: Teodoro Spray, MD;  Location: [place] CV              LAB;  Service: Cardiovascular;  Laterality: N/A; BMI    Body Mass Index:  26.63 kg/m     Reproductive/Obstetrics negative OB ROS                              Anesthesia Physical Anesthesia Plan  ASA: IV and emergent  Anesthesia Plan: General   Post-op Pain Management:    Induction:   PONV Risk Score and Plan:   Airway Management Planned:   Additional Equipment:   Intra-op Plan:   Post-operative Plan:   Informed Consent: I have reviewed the patients History and Physical, chart, labs and discussed the procedure including the risks, benefits and alternatives for the proposed anesthesia with the patient or authorized representative who has indicated his/her understanding and acceptance.   Dental Advisory Given  Plan Discussed with: CRNA  Anesthesia Plan Comments:         Anesthesia Quick Evaluation

## 2017-08-08 NOTE — Progress Notes (Signed)
Initial Nutrition Assessment  DOCUMENTATION CODES:   Not applicable  INTERVENTION:   Ensure Enlive po BID, each supplement provides 350 kcal and 20 grams of protein  MVI daily  Vitamin C 257m po BID  Liberalize diet  NUTRITION DIAGNOSIS:   Unintentional weight loss related to chronic illness(COPD, CHF, DM, heart disease ) as evidenced by 22 percent weight loss in 7 months.  GOAL:   Patient will meet greater than or equal to 90% of their needs  MONITOR:   PO intake, Supplement acceptance, Labs, Weight trends, Skin, I & O's  REASON FOR ASSESSMENT:   Malnutrition Screening Tool    ASSESSMENT:   62year old female patient with history of diabetes mellitus type 2, COPD, non-STEMI, congestive heart failure presented to the emergency room with fatigue and weakness.   Met with pt in room today. Pt reports poor appetite and oral intake since January when she underwent open heart surgery. Pt reports her appetite has never recovered since the surgery. Per chart, pt has lost 45lbs(22%) in 7 months; this is severe weight loss. Pt does have supplements at home but has not started drinking them yet. Pt is willing to try strawberry Ensure while here. Pt with weakness, drepression and GI bleed; pt is at risk for vitamin C deficiency given her poor oral intake and smoking habit. Recommend vitamin C supplementation. RD will order supplements and MVI. Pt likely at high refeeding risk; recommend monitor K, Mg and P labs. Will liberalize diet to encourage increased oral intake. Pt s/p EGD today; found to have hiatal hernia and gastritis.   Medications reviewed and include: nicotine, protonix, NaCl w/ 5% dextrose @75ml /hr, protonix  Labs reviewed: creat 1.03(H), Ca 8.5(L) Hgb 6.7(L), Hct 20.2(L) MCH- 25.5(L), MCHC 31.6(L)  NUTRITION - FOCUSED PHYSICAL EXAM:    Most Recent Value  Orbital Region  No depletion  Upper Arm Region  No depletion  Thoracic and Lumbar Region  No depletion  Buccal  Region  No depletion  Temple Region  No depletion  Clavicle Bone Region  No depletion  Clavicle and Acromion Bone Region  No depletion  Scapular Bone Region  No depletion  Dorsal Hand  No depletion  Patellar Region  Mild depletion  Anterior Thigh Region  Mild depletion  Posterior Calf Region  Mild depletion  Edema (RD Assessment)  None  Hair  Reviewed  Eyes  Reviewed  Mouth  Reviewed  Skin  Reviewed  Nails  Reviewed     Diet Order:   Diet Order           Diet Heart Room service appropriate? Yes; Fluid consistency: Thin  Diet effective now         EDUCATION NEEDS:   Education needs have been addressed  Skin:  Skin Assessment: Reviewed RN Assessment  Last BM:  7/16  Height:   Ht Readings from Last 1 Encounters:  08/08/17 5' 5"  (1.651 m)    Weight:   Wt Readings from Last 1 Encounters:  08/07/17 160 lb (72.6 kg)    Ideal Body Weight:  56.8 kg  BMI:  Body mass index is 26.63 kg/m.  Estimated Nutritional Needs:   Kcal:  1600-1800kcal/day   Protein:  73-87g/day   Fluid:  >1.5L/day   CKoleen DistanceMS, RD, LDN Pager #- [phone]#- [phone] Hours Pager: [phone]

## 2017-08-08 NOTE — Clinical Social Work Note (Signed)
CSW consulted to assist with durable medical equipment. RN CM to be informed and will address this with patient. Shela Leff MSW,LCSW [phone]

## 2017-08-08 NOTE — Consult Note (Signed)
[name], MD [facility]  [address], [place], [place] [address] Phone: [phone] Fax : [phone]  Consultation  Referring Provider:     Dr. Jerelyn Charles Primary Care Physician:  Angelene [facility] Primary Gastroenterologist:  Dr. Marius Ditch Reason for Consultation:     Melena  Date of Admission:  08/07/2017 Date of Consultation:  08/08/2017         HPI:   [name] is a 62 y.o. female Who is admitted with melena.  The patient states she had been feeling weak and has had melena for the last year or so. The patient was admitted in December 2018 for dark maroon stools coming out of her OG tube and had an upper endoscopy.  The patient has a history of hepatitis C being treated in the past and reports that after she was discharged from the hospital and told that she would need a colonoscopy as an outpatient she went up to [place] whereupon she was admitted to the hospital and states she underwent bypass surgery. The patient was found to have a gastric ulcer with bleeding back then on her EGD. The patient was admitted with a hemoglobin of 6.1 with a hemoglobin today of 6.7 after going up this morning to 7.1.  The patient's most recent hemoglobin prior was in June of this year at 11.7.  She denies any abdominal pain nausea vomiting fevers or chills.  Past Medical History:  Diagnosis Date  . CHF (congestive heart failure) ([place])   . COPD (chronic obstructive pulmonary disease) ([place])   . Diabetes mellitus without complication ([place])   . NSTEMI (non-ST elevated myocardial infarction) [facility])     Past Surgical History:  Procedure Laterality Date  . APPENDECTOMY    . CARDIAC CATHETERIZATION    . CHOLECYSTECTOMY    . ESOPHAGOGASTRODUODENOSCOPY N/A 12/19/2016   Procedure: ESOPHAGOGASTRODUODENOSCOPY (EGD);  Surgeon: Lin Landsman, MD;  Location: [facility] ENDOSCOPY;  Service: Gastroenterology;  Laterality: N/A;  . LEFT HEART CATH AND CORONARY ANGIOGRAPHY N/A 12/23/2016     Procedure: LEFT HEART CATH AND CORONARY ANGIOGRAPHY;  Surgeon: Teodoro Spray, MD;  Location: [place] CV LAB;  Service: Cardiovascular;  Laterality: N/A;    Prior to Admission medications   Medication Sig Start Date End Date Taking? Authorizing Provider  atorvastatin (LIPITOR) 40 MG tablet Take 1 tablet (40 mg total) by mouth daily. Patient not taking: Reported on 08/07/2017 06/22/17 10/20/17  Merlyn Lot, MD  blood glucose meter kit and supplies KIT Dispense based on patient and insurance preference. Use up to four times daily as directed. (FOR ICD-9 250.00, 250.01). Patient not taking: Reported on 08/07/2017 12/24/16   Hillary Bow, MD  carvedilol (COREG) 3.125 MG tablet Take 1 tablet (3.125 mg total) by mouth 2 (two) times daily with a meal. Patient not taking: Reported on 08/07/2017 06/22/17   Merlyn Lot, MD  Insulin Glargine (LANTUS SOLOSTAR) 100 UNIT/ML Solostar Pen Inject 15 Units into the skin daily. Patient not taking: Reported on 08/07/2017 06/22/17   Merlyn Lot, MD  Insulin Pen Needle 32G X 5 MM MISC 1 Units by Does not apply route daily. Patient not taking: Reported on 08/07/2017 12/24/16   Hillary Bow, MD  isosorbide mononitrate (IMDUR) 30 MG 24 hr tablet Take 1 tablet (30 mg total) by mouth daily. Patient not taking: Reported on 08/07/2017 06/22/17   Merlyn Lot, MD  pantoprazole (PROTONIX) 40 MG tablet Take 1 tablet (40 mg total) by mouth 2 (two) times daily  before a meal. Patient not taking: Reported on 08/07/2017 12/23/16   Dustin Flock, MD  pregabalin (LYRICA) 100 MG capsule Take 1 capsule (100 mg total) by mouth 3 (three) times daily. Patient not taking: Reported on 08/07/2017 06/22/17 07/22/17  Merlyn Lot, MD    Family History  Problem Relation Age of Onset  . Hypertension Mother   . Diabetes Mellitus II Mother   . Heart disease Father      Social History   Tobacco Use  . Smoking status: Current Some Day Smoker    Packs/day: 0.25     Types: Cigarettes  . Smokeless tobacco: Never Used  Substance Use Topics  . Alcohol use: No  . Drug use: No    Allergies as of 08/07/2017 - Review Complete 08/07/2017  Allergen Reaction Noted  . Latex Anaphylaxis and Rash 11/07/2012  . Gabapentin Nausea And Vomiting 12/20/2012    Review of Systems:    All systems reviewed and negative except where noted in HPI.   Physical Exam:  Vital signs in last 24 hours: Temp:  [96.4 F (35.8 C)-98.6 F (37 C)] 97.8 F (36.6 C) (07/23 1423) Pulse Rate:  [74-98] 87 (07/23 1423) Resp:  [9-20] 19 (07/23 1423) BP: (98-161)/(62-86) 161/85 (07/23 1423) SpO2:  [94 %-100 %] 94 % (07/23 1423) Last BM Date: 08/02/17(per pt) General:   Pleasant, cooperative in NAD Head:  Normocephalic and atraumatic. Eyes:   No icterus.   Conjunctiva pink. PERRLA. Ears:  Normal auditory acuity. Neck:  Supple; no masses or thyroidomegaly Lungs: Respirations even and unlabored. Lungs clear to auscultation bilaterally.   No wheezes, crackles, or rhonchi.  Heart:  Regular rate and rhythm;  Without murmur, clicks, rubs or gallops Abdomen:  Soft, nondistended, nontender. Normal bowel sounds. No appreciable masses or hepatomegaly.  No rebound or guarding.  Rectal:  Not performed. Msk:  Symmetrical without gross deformities.    Extremities:  Without edema, cyanosis or clubbing. Neurologic:  Alert and oriented x3;  grossly normal neurologically. Skin:  Intact without significant lesions or rashes. Cervical Nodes:  No significant cervical adenopathy. Psych:  Alert and cooperative. Normal affect.  LAB RESULTS: Recent Labs    08/07/17 1308 08/08/17 0101 08/08/17 0411  WBC 9.2  --   --   HGB 6.1* 7.1* 6.7*  HCT 19.3* 21.0* 20.2*  PLT 194  --   --    BMET Recent Labs    08/07/17 1308 08/08/17 0411  NA 140 142  K 3.7 3.8  CL 107 112*  CO2 25 25  GLUCOSE 152* 152*  BUN 22 22  CREATININE 1.11* 1.03*  CALCIUM 8.8* 8.5*   LFT No results for input(s): PROT,  ALBUMIN, AST, ALT, ALKPHOS, BILITOT, BILIDIR, IBILI in the last 72 hours. PT/INR No results for input(s): LABPROT, INR in the last 72 hours.  STUDIES: No results found.    Impression / Plan:   Assessment: Active Problems:   GI bleed   [name] is a 62 y.o. y/o female with a history of peptic ulcer disease and an EGD in the past with an ulcer.  The patient now comes in with a year of black stools.  She states that she came in because she was feeling weak and tired and was found to have profound anemia at 6.1.   Plan:  Due to the patient's profound anemia she will be set up for an EGD for today.  The patient has very been nothing by mouth.  If the patient's EGD  is negative and her hemoglobin remained stable she should follow-up as an outpatient for a colonoscopy as previously recommended. I have discussed risks & benefits which include, but are not limited to, bleeding, infection, perforation & drug reaction.  The patient agrees with this plan & written consent will be obtained.     Thank you for involving me in the care of this patient.      LOS: 1 day   [name], MD  08/08/2017, 4:41 PM    Note: This dictation was prepared with Dragon dictation along with smaller phrase technology. Any transcriptional errors that result from this process are unintentional.

## 2017-08-09 LAB — BASIC METABOLIC PANEL
ANION GAP: 6 (ref 5–15)
BUN: 20 mg/dL (ref 8–23)
CHLORIDE: 108 mmol/L (ref 98–111)
CO2: 24 mmol/L (ref 22–32)
Calcium: 8.2 mg/dL — ABNORMAL LOW (ref 8.9–10.3)
Creatinine, Ser: 1.37 mg/dL — ABNORMAL HIGH (ref 0.44–1.00)
GFR calc Af Amer: 47 mL/min — ABNORMAL LOW (ref >60)
GFR, EST NON AFRICAN AMERICAN: 40 mL/min — AB (ref >60)
Glucose, Bld: 202 mg/dL — ABNORMAL HIGH (ref 70–99)
POTASSIUM: 3.6 mmol/L (ref 3.5–5.1)
Sodium: 138 mmol/L (ref 135–145)

## 2017-08-09 LAB — BPAM RBC
Blood Product Expiration Date: 201908162359
Blood Product Expiration Date: 201908162359
ISSUE DATE / TIME: 201907221836
ISSUE DATE / TIME: 201907230918
Unit Type and Rh: 5100
Unit Type and Rh: 5100

## 2017-08-09 LAB — CBC
HCT: 25 % — ABNORMAL LOW (ref 35.0–47.0)
HEMOGLOBIN: 8.4 g/dL — AB (ref 12.0–16.0)
MCH: 27.1 pg (ref 26.0–34.0)
MCHC: 33.6 g/dL (ref 32.0–36.0)
MCV: 80.7 fL (ref 80.0–100.0)
PLATELETS: 146 10*3/uL — AB (ref 150–440)
RBC: 3.1 MIL/uL — AB (ref 3.80–5.20)
RDW: 16.7 % — ABNORMAL HIGH (ref 11.5–14.5)
WBC: 9 10*3/uL (ref 3.6–11.0)

## 2017-08-09 LAB — TYPE AND SCREEN
ABO/RH(D): O POS
ANTIBODY SCREEN: NEGATIVE
Unit division: 0
Unit division: 0

## 2017-08-09 MED ORDER — PANTOPRAZOLE SODIUM 40 MG PO TBEC: 40.0000 mg | DELAYED_RELEASE_TABLET | Freq: Two times a day (BID) | ORAL | 0 refills | Status: DC

## 2017-08-09 MED ORDER — INSULIN GLARGINE 100 UNIT/ML SOLOSTAR PEN: 15.0000 [IU] | PEN_INJECTOR | Freq: Every day | SUBCUTANEOUS | 0 refills | Status: DC

## 2017-08-09 MED ORDER — ATORVASTATIN CALCIUM 40 MG PO TABS: 40.0000 mg | ORAL_TABLET | Freq: Every day | ORAL | 0 refills | Status: DC

## 2017-08-09 MED ORDER — ISOSORBIDE MONONITRATE ER 30 MG PO TB24: 30.0000 mg | ORAL_TABLET | Freq: Every day | ORAL | 0 refills | Status: DC

## 2017-08-09 MED ORDER — CARVEDILOL 3.125 MG PO TABS: 3.1250 mg | ORAL_TABLET | Freq: Two times a day (BID) | ORAL | 0 refills | Status: DC

## 2017-08-09 MED ORDER — PREGABALIN 100 MG PO CAPS: 100.0000 mg | ORAL_CAPSULE | Freq: Three times a day (TID) | ORAL | 0 refills | Status: DC

## 2017-08-09 NOTE — Progress Notes (Signed)
Patient recently discharge via wheelchair to waiting car.  Instructions were given.  RX given for all of her medications

## 2017-08-09 NOTE — Care Management (Signed)
RNCM confirmed that patient has active Medicaid Pharmacy coverage.

## 2017-08-09 NOTE — Discharge Summary (Signed)
[facility] [place] NAME: [name]    MR#:  [id number]  DATE OF BIRTH:  1955/05/22  DATE OF ADMISSION:  08/07/2017 ADMITTING PHYSICIAN: Saundra Shelling, MD  DATE OF DISCHARGE: No discharge date for patient encounter.  PRIMARY CARE PHYSICIAN: [place]    ADMISSION DIAGNOSIS:  Weakness [R53.1] Gastrointestinal hemorrhage, unspecified gastrointestinal hemorrhage type [K92.2] Anemia, unspecified type [D64.9]  DISCHARGE DIAGNOSIS:  Active Problems:   GI bleed   Anemia   Weakness   SECONDARY DIAGNOSIS:   Past Medical History:  Diagnosis Date  . CHF (congestive heart failure) ([place])   . COPD (chronic obstructive pulmonary disease) ([place])   . Diabetes mellitus without complication ([place])   . NSTEMI (non-ST elevated myocardial infarction) [facility])     HOSPITAL COURSE:  63 year oldfemale patient with history of diabetes mellitus type 2, COPD, non-STEMI, congestive heart failure presented to the emergency room with fatigue and weakness.  *Acute severe symptomatic anemia due to blood loss Improved Status post 2 unit PRBC, status post EGD noted for gastritis-gastroenterology recommending outpatient colonoscopy status post discharge for further evaluation, hemoglobin 8.6 at discharge, and patient did well   *Acute gastrointestinal bleeding Resolved Etiology unknown EGD done the day by gastroenterology noted for gastritis/recommended outpatient colonoscopy status post discharge Treated with Protonix drip, status post blood transfusion per above, no further active bleeding  *COPD without exacerbation stable  Provided breathing treatments as needed  *Tobacco abuse, chronic Stable Tobacco cessation counseled to the patient Nicotine patch offered  DISCHARGE CONDITIONS:   stable  CONSULTS OBTAINED:  Treatment Team:  [name], MD  DRUG ALLERGIES:   Allergies  Allergen Reactions  . Latex  Anaphylaxis and Rash  . Gabapentin Nausea And Vomiting    DISCHARGE MEDICATIONS:   Allergies as of 08/09/2017      Reactions   Latex Anaphylaxis, Rash   Gabapentin Nausea And Vomiting      Medication List    TAKE these medications   atorvastatin 40 MG tablet Commonly known as:  LIPITOR Take 1 tablet (40 mg total) by mouth daily.   blood glucose meter kit and supplies Kit Dispense based on patient and insurance preference. Use up to four times daily as directed. (FOR ICD-9 250.00, 250.01).   carvedilol 3.125 MG tablet Commonly known as:  COREG Take 1 tablet (3.125 mg total) by mouth 2 (two) times daily with a meal.   Insulin Glargine 100 UNIT/ML Solostar Pen Commonly known as:  LANTUS SOLOSTAR Inject 15 Units into the skin daily.   Insulin Pen Needle 32G X 5 MM Misc 1 Units by Does not apply route daily.   isosorbide mononitrate 30 MG 24 hr tablet Commonly known as:  IMDUR Take 1 tablet (30 mg total) by mouth daily.   pantoprazole 40 MG tablet Commonly known as:  PROTONIX Take 1 tablet (40 mg total) by mouth 2 (two) times daily before a meal.   pregabalin 100 MG capsule Commonly known as:  LYRICA Take 1 capsule (100 mg total) by mouth 3 (three) times daily.        DISCHARGE INSTRUCTIONS:  If you experience worsening of your admission symptoms, develop shortness of breath, life threatening emergency, suicidal or homicidal thoughts you must seek medical attention immediately by calling 911 or calling your MD immediately  if symptoms less severe.  You Must read complete instructions/literature along with all the possible adverse reactions/side effects for all the Medicines you take and that have  been prescribed to you. Take any new Medicines after you have completely understood and accept all the possible adverse reactions/side effects.   Please note  You were cared for by a hospitalist during your hospital stay. If you have any questions about your discharge  medications or the care you received while you were in the hospital after you are discharged, you can call the unit and asked to speak with the hospitalist on call if the hospitalist that took care of you is not available. Once you are discharged, your primary care physician will handle any further medical issues. Please note that NO REFILLS for any discharge medications will be authorized once you are discharged, as it is imperative that you return to your primary care physician (or establish a relationship with a primary care physician if you do not have one) for your aftercare needs so that they can reassess your need for medications and monitor your lab values.    Today   CHIEF COMPLAINT:   Chief Complaint  Patient presents with  . Weakness    HISTORY OF PRESENT ILLNESS:  62 y.o. female with a known history of congestive heart failure, COPD, diabetes mellitus type 2, non-STEMI presented to the emergency room for weakness and fatigue.  Patient has generalized weakness.  She appears very tired and gets fatigued easily whenever she moves around.  She was evaluated in the emergency room was found to have hemoglobin around 6.  Hemoglobin around 1 month was 11.  Patient complains of black tarry stool.  No complains of any vomiting of blood or coughing of blood patient never had any EGD and colonoscopy in the past.. Hospitalist service was consulted for further care. VITAL SIGNS:  Blood pressure (!) 153/91, pulse 85, temperature 97.9 F (36.6 C), temperature source Oral, resp. rate 20, height 5' 5"  (1.651 m), weight 72.6 kg (160 lb), SpO2 100 %.  I/O:    Intake/Output Summary (Last 24 hours) at 08/09/2017 1118 Last data filed at 08/09/2017 0858 Gross per 24 hour  Intake 3066.42 ml  Output 700 ml  Net 2366.42 ml    PHYSICAL EXAMINATION:  GENERAL:  62 y.o.-year-old patient lying in the bed with no acute distress.  EYES: Pupils equal, round, reactive to light and accommodation. No scleral  icterus. Extraocular muscles intact.  HEENT: Head atraumatic, normocephalic. Oropharynx and nasopharynx clear.  NECK:  Supple, no jugular venous distention. No thyroid enlargement, no tenderness.  LUNGS: Normal breath sounds bilaterally, no wheezing, rales,rhonchi or crepitation. No use of accessory muscles of respiration.  CARDIOVASCULAR: S1, S2 normal. No murmurs, rubs, or gallops.  ABDOMEN: Soft, non-tender, non-distended. Bowel sounds present. No organomegaly or mass.  EXTREMITIES: No pedal edema, cyanosis, or clubbing.  NEUROLOGIC: Cranial nerves II through XII are intact. Muscle strength 5/5 in all extremities. Sensation intact. Gait not checked.  PSYCHIATRIC: The patient is alert and oriented x 3.  SKIN: No obvious rash, lesion, or ulcer.   DATA REVIEW:   CBC Recent Labs  Lab 08/09/17 0931  WBC 9.0  HGB 8.4*  HCT 25.0*  PLT 146*    Chemistries  Recent Labs  Lab 08/09/17 0931  NA 138  K 3.6  CL 108  CO2 24  GLUCOSE 202*  BUN 20  CREATININE 1.37*  CALCIUM 8.2*    Cardiac Enzymes No results for input(s): TROPONINI in the last 168 hours.  Microbiology Results  Results for orders placed or performed during the hospital encounter of 08/07/17  MRSA PCR Screening  Status: Abnormal   Collection Time: 08/08/17 12:17 PM  Result Value Ref Range Status   MRSA by PCR POSITIVE (A) NEGATIVE Final    Comment:        The GeneXpert MRSA Assay (FDA approved for NASAL specimens only), is one component of a comprehensive MRSA colonization surveillance program. It is not intended to diagnose MRSA infection nor to guide or monitor treatment for MRSA infections. RESULT CALLED TO, READ BACK BY AND VERIFIED WITH: TRICIA SPOON 08/08/17 1355 KLW Performed at [facility], [address] [place], [place] [address]     RADIOLOGY:  No results found.  EKG:   Orders placed or performed during the hospital encounter of 08/07/17  . ED EKG  . ED EKG  . EKG 12-Lead   . EKG 12-Lead      Management plans discussed with the patient, family and they are in agreement.  CODE STATUS:     Code Status Orders  (From admission, onward)        Start     Ordered   08/07/17 1705  Full code  Continuous     08/07/17 1704    Code Status History    Date Active Date Inactive Code Status Order ID Comments User Context   12/18/2016 1146 12/26/2016 2032 Full Code [id number]  Vaughan Basta, MD Inpatient   12/18/2016 0959 12/18/2016 1146 Full Code [id number]  Vaughan Basta, MD ED   12/04/2015 0509 12/07/2015 1756 Full Code [id number]  Holley Raring, NP ED   10/28/2015 1640 10/29/2015 1711 Full Code [id number]  Bettey Costa, MD Inpatient   08/29/2015 1438 08/30/2015 1750 Full Code [id number]  Hugelmeyer, Ubaldo Glassing, DO Inpatient      TOTAL TIME TAKING CARE OF THIS PATIENT: 45 minutes.    Avel Peace Jennah Satchell M.D on 08/09/2017 at 11:18 AM  Between 7am to 6pm - Pager - [phone]  After 6pm go to [email or web address] - password [email or web address] [facility]  Office  [phone]  CC: Primary care physician; [facility]   Note: This dictation was prepared with Dragon dictation along with smaller phrase technology. Any transcriptional errors that result from this process are unintentional.

## 2017-08-09 NOTE — Care Management (Signed)
Per nursing patient was discharged to the visitors entrance, then ride did not show.  RNCM spoke with patient.  Patient states that she has someone to pick her up, but it will not be until after class is over at 9pm.  Patient confirms that the person who will be picking her up name is Malachy Mood

## 2017-08-09 NOTE — Progress Notes (Signed)
Patient said she may need help getting her medications filled if her medicaid has not "kicked in yet".  I spoke with Dr Jerelyn Charles who is writing RX for home meds and I will talk with care manager

## 2017-08-09 NOTE — Progress Notes (Signed)
Patient was brought back to her room when he ride did not show up.  She said someone will be able to get her after 9pm

## 2017-08-10 ENCOUNTER — Telehealth: Payer: Self-pay | Admitting: Family

## 2017-08-10 ENCOUNTER — Ambulatory Visit: Payer: Self-pay | Admitting: Family

## 2017-08-10 ENCOUNTER — Encounter: Payer: Self-pay | Admitting: Gastroenterology

## 2017-08-10 NOTE — Telephone Encounter (Signed)
Patient did not show for her [facility] Failure Clinic appointment on 08/10/17. Will attempt to reschedule.   Of note, this is the 4th appointment that she has missed.

## 2017-08-10 NOTE — Anesthesia Postprocedure Evaluation (Signed)
Anesthesia Post Note  Patient: [name]  Procedure(s) Performed: ESOPHAGOGASTRODUODENOSCOPY (EGD) WITH PROPOFOL (N/A )  Patient location during evaluation: PACU Anesthesia Type: General Level of consciousness: awake and alert Pain management: pain level controlled Vital Signs Assessment: post-procedure vital signs reviewed and stable Respiratory status: spontaneous breathing, nonlabored ventilation, respiratory function stable and patient connected to nasal cannula oxygen Cardiovascular status: blood pressure returned to baseline and stable Postop Assessment: no apparent nausea or vomiting Anesthetic complications: no     Last Vitals:  Vitals:   08/09/17 0851 08/09/17 1802  BP: (!) 153/91 (!) 147/79  Pulse: 85 87  Resp:    Temp:    SpO2:      Last Pain:  Vitals:   08/09/17 1600  TempSrc:   PainSc: 0-No pain                 Molli Barrows

## 2017-08-10 NOTE — Progress Notes (Deleted)
Patient ID: [name], female    DOB: April 12, 1955, 62 y.o.   MRN: [id number]  HPI  [name] is a 62 y/o female with a history of COPD, diabetes, NSTEMI, renal insufficiency, tobacco use and chronic heart failure.   Echo report from 12/19/16 reviewed and showed an EF of 40-45% along with moderate AS, mild Tanya and moderate MR. EF has declined from previous reading in October 2017. Cardiac catheterization done 12/23/16 showed 3 vessel CAD with considerate of CABG when pulmonary and renal status is optimized. Prox RCA to mid RCA has 100% stenosis, Prox CX to mid CX has 80% stenosis and Ost LAD to prox LAD has 90% stenosis.   Admitted 08/07/17 due to severe anemia with GIB. GI consult obtained. Received 2 units PRBC's. EGD noted gastritis. Discharged after 2 days. Was in the ED 06/22/17 due to weakness where she was treated and released.    She presents today for a follow-up visit with a chief complaint of     Past Medical History:  Diagnosis Date  . CHF (congestive heart failure) ([place])   . COPD (chronic obstructive pulmonary disease) ([place])   . Diabetes mellitus without complication ([place])   . NSTEMI (non-ST elevated myocardial infarction) [facility])    Past Surgical History:  Procedure Laterality Date  . APPENDECTOMY    . CARDIAC CATHETERIZATION    . CHOLECYSTECTOMY    . ESOPHAGOGASTRODUODENOSCOPY N/A 12/19/2016   Procedure: ESOPHAGOGASTRODUODENOSCOPY (EGD);  Surgeon: Lin Landsman, MD;  Location: [facility] ENDOSCOPY;  Service: Gastroenterology;  Laterality: N/A;  . ESOPHAGOGASTRODUODENOSCOPY (EGD) WITH PROPOFOL N/A 08/08/2017   Procedure: ESOPHAGOGASTRODUODENOSCOPY (EGD) WITH PROPOFOL;  Surgeon: [name], MD;  Location: [facility] ENDOSCOPY;  Service: Endoscopy;  Laterality: N/A;  . LEFT HEART CATH AND CORONARY ANGIOGRAPHY N/A 12/23/2016   Procedure: LEFT HEART CATH AND CORONARY ANGIOGRAPHY;  Surgeon: Teodoro Spray, MD;  Location: [facility] CV LAB;  Service: Cardiovascular;  Laterality:  N/A;   Family History  Problem Relation Age of Onset  . Hypertension Mother   . Diabetes Mellitus II Mother   . Heart disease Father    Social History   Tobacco Use  . Smoking status: Current Some Day Smoker    Packs/day: 0.25    Types: Cigarettes  . Smokeless tobacco: Never Used  Substance Use Topics  . Alcohol use: No   Allergies  Allergen Reactions  . Latex Anaphylaxis and Rash  . Gabapentin Nausea And Vomiting     Review of Systems  Constitutional: Positive for appetite change (decreased) and fatigue.  HENT: Negative for congestion, postnasal drip and sore throat.   Eyes: Negative.   Respiratory: Negative for chest tightness and shortness of breath.   Cardiovascular: Negative for chest pain, palpitations and leg swelling.  Gastrointestinal: Negative for abdominal distention and abdominal pain.  Endocrine: Negative.   Genitourinary: Negative.   Musculoskeletal: Positive for back pain (chronic back pain). Negative for neck pain.  Skin: Negative.   Allergic/Immunologic: Negative.   Neurological: Positive for weakness and light-headedness (yesterday). Negative for dizziness.  Hematological: Negative for adenopathy. Does not bruise/bleed easily.  Psychiatric/Behavioral: Positive for sleep disturbance (wake often during the night). Negative for dysphoric mood. The patient is nervous/anxious.      Physical Exam  Constitutional: She is oriented to person, place, and time. She appears well-developed and well-nourished.  HENT:  Head: Normocephalic and atraumatic.  Neck: Normal range of motion. Neck supple. No JVD present.  Cardiovascular: Normal rate and regular rhythm.  Pulmonary/Chest: Effort  normal. She has no wheezes. She has no rales.  Abdominal: Soft. She exhibits no distension. There is no tenderness.  Musculoskeletal: She exhibits no edema or tenderness.  Neurological: She is alert and oriented to person, place, and time.  Skin: Skin is warm and dry.   Psychiatric: She has a normal mood and affect. Her behavior is normal. Thought content normal.  Nursing note and vitals reviewed.  Assessment & Plan:  1: Chronic heart failure with reduced ejection fraction- - NYHA class III - euvolemic today - not weighing daily as she doesn't have any scales. Set of scales was given to her and she was instructed to weigh every morning, write the weight down and call for an overnight weight gain of >2 pounds or a weekly weight gain of >5 pounds - adds "some" salt to foods and admits that they eat fast food and convenience type foods more often now. Discussed the importance of not adding any salt to her food and to read food labels so that she can closely follow a 2081m sodium diet. Written dietary information was given to her about this.  - unsure of her fluid intake. Discussed keeping her daily fluid intake to between 40-60 ounces of fluid daily. Instructed that this included anything that was liquid at room temperature such as jello/ice cream - saw cardiology ([facility]) 12/28/16 - BNP 06/22/17 was 587.0  2: NSTEMI- - does not have cardiology f/u appointment scheduled and has questions regarding possible CABG - called Dr FBethanne Gingeroffice (who did the cath) and [facility]. However, receptionist said she would speak with Dr. FUbaldo Glassing- in the meantime, an appointment was made with Dr. ESaunders Revelwho could see her today. [facility] an [facility] - spoke with Dr. ESaunders Revelwho said that he would call Dr. FUbaldo Glassingto ask what he would prefer to do so that patient can get scheduled for possible CABG  3: Diabetes- - fasting glucose in clinic today was  - currently doesn't have a PCP but she says that she will call around today and get something scheduled - BMP from 08/09/17 reviewed  and showed sodium 138, potassium 3.6 and GFR 47 - A1c from 08/12/16 reviewed and was >14%  4: Tobacco- - currently smoking 1-2 cigarettes daily - complete cessation discussed for 3 minutes with her.  Patient did not bring her medications nor a list. Each medication was verbally reviewed with the patient and she was encouraged to bring the bottles to every visit to confirm accuracy of list.

## 2017-08-22 ENCOUNTER — Telehealth: Payer: Self-pay | Admitting: Family

## 2017-08-22 ENCOUNTER — Ambulatory Visit: Payer: Self-pay | Admitting: Family

## 2017-08-22 NOTE — Telephone Encounter (Signed)
Patient did not show for her Heart Failure Clinic appointment on 08/22/17. Will attempt to reschedule.   Of note, this is the 5th appointment that she has missed.

## 2017-08-26 ENCOUNTER — Observation Stay
Admission: EM | Admit: 2017-08-26 | Discharge: 2017-08-29 | Disposition: A | Discharge status: Home / Self Care | Payer: Medicaid Other | Attending: Internal Medicine | Admitting: Internal Medicine

## 2017-08-26 ENCOUNTER — Encounter: Payer: Self-pay | Admitting: Emergency Medicine

## 2017-08-26 ENCOUNTER — Emergency Department: Payer: Medicaid Other

## 2017-08-26 DIAGNOSIS — E119 Type 2 diabetes mellitus without complications: Secondary | ICD-10-CM | POA: Diagnosis not present

## 2017-08-26 DIAGNOSIS — Z794 Long term (current) use of insulin: Secondary | ICD-10-CM | POA: Insufficient documentation

## 2017-08-26 DIAGNOSIS — R531 Weakness: Secondary | ICD-10-CM | POA: Diagnosis present

## 2017-08-26 DIAGNOSIS — I110 Hypertensive heart disease with heart failure: Principal | ICD-10-CM | POA: Insufficient documentation

## 2017-08-26 DIAGNOSIS — I214 Non-ST elevation (NSTEMI) myocardial infarction: Secondary | ICD-10-CM

## 2017-08-26 DIAGNOSIS — I700 Atherosclerosis of aorta: Secondary | ICD-10-CM | POA: Insufficient documentation

## 2017-08-26 DIAGNOSIS — F17210 Nicotine dependence, cigarettes, uncomplicated: Secondary | ICD-10-CM | POA: Insufficient documentation

## 2017-08-26 DIAGNOSIS — Z951 Presence of aortocoronary bypass graft: Secondary | ICD-10-CM | POA: Insufficient documentation

## 2017-08-26 DIAGNOSIS — I5023 Acute on chronic systolic (congestive) heart failure: Secondary | ICD-10-CM | POA: Diagnosis not present

## 2017-08-26 DIAGNOSIS — I4510 Unspecified right bundle-branch block: Secondary | ICD-10-CM | POA: Diagnosis not present

## 2017-08-26 DIAGNOSIS — I2510 Atherosclerotic heart disease of native coronary artery without angina pectoris: Secondary | ICD-10-CM | POA: Diagnosis not present

## 2017-08-26 DIAGNOSIS — Z79899 Other long term (current) drug therapy: Secondary | ICD-10-CM | POA: Insufficient documentation

## 2017-08-26 DIAGNOSIS — Z91040 Latex allergy status: Secondary | ICD-10-CM | POA: Diagnosis not present

## 2017-08-26 DIAGNOSIS — I081 Rheumatic disorders of both mitral and tricuspid valves: Secondary | ICD-10-CM | POA: Diagnosis not present

## 2017-08-26 DIAGNOSIS — Z8249 Family history of ischemic heart disease and other diseases of the circulatory system: Secondary | ICD-10-CM | POA: Insufficient documentation

## 2017-08-26 DIAGNOSIS — I509 Heart failure, unspecified: Secondary | ICD-10-CM

## 2017-08-26 DIAGNOSIS — I252 Old myocardial infarction: Secondary | ICD-10-CM | POA: Insufficient documentation

## 2017-08-26 DIAGNOSIS — M7989 Other specified soft tissue disorders: Secondary | ICD-10-CM

## 2017-08-26 DIAGNOSIS — Z888 Allergy status to other drugs, medicaments and biological substances status: Secondary | ICD-10-CM | POA: Insufficient documentation

## 2017-08-26 DIAGNOSIS — J449 Chronic obstructive pulmonary disease, unspecified: Secondary | ICD-10-CM | POA: Diagnosis not present

## 2017-08-26 LAB — TYPE AND SCREEN
ABO/RH(D): O POS
ANTIBODY SCREEN: NEGATIVE

## 2017-08-26 LAB — URINALYSIS, COMPLETE (UACMP) WITH MICROSCOPIC
BILIRUBIN URINE: NEGATIVE
Bacteria, UA: NONE SEEN
GLUCOSE, UA: NEGATIVE mg/dL
HGB URINE DIPSTICK: NEGATIVE
Ketones, ur: NEGATIVE mg/dL
LEUKOCYTES UA: NEGATIVE
NITRITE: NEGATIVE
PH: 6 (ref 5.0–8.0)
Protein, ur: 100 mg/dL — AB
Specific Gravity, Urine: 1.018 (ref 1.005–1.030)

## 2017-08-26 LAB — COMPREHENSIVE METABOLIC PANEL
ALT: 8 U/L (ref 0–44)
AST: 15 U/L (ref 15–41)
Albumin: 3.5 g/dL (ref 3.5–5.0)
Alkaline Phosphatase: 98 U/L (ref 38–126)
Anion gap: 7 (ref 5–15)
BILIRUBIN TOTAL: 0.6 mg/dL (ref 0.3–1.2)
BUN: 18 mg/dL (ref 8–23)
CHLORIDE: 108 mmol/L (ref 98–111)
CO2: 25 mmol/L (ref 22–32)
Calcium: 8.9 mg/dL (ref 8.9–10.3)
Creatinine, Ser: 1.05 mg/dL — ABNORMAL HIGH (ref 0.44–1.00)
GFR, EST NON AFRICAN AMERICAN: 56 mL/min — AB (ref >60)
Glucose, Bld: 142 mg/dL — ABNORMAL HIGH (ref 70–99)
Potassium: 3.8 mmol/L (ref 3.5–5.1)
Sodium: 140 mmol/L (ref 135–145)
TOTAL PROTEIN: 8.1 g/dL (ref 6.5–8.1)

## 2017-08-26 LAB — BRAIN NATRIURETIC PEPTIDE: B Natriuretic Peptide: 1095 pg/mL — ABNORMAL HIGH (ref 0.0–100.0)

## 2017-08-26 LAB — CBC
HCT: 24.5 % — ABNORMAL LOW (ref 35.0–47.0)
Hemoglobin: 7.8 g/dL — ABNORMAL LOW (ref 12.0–16.0)
MCH: 24.5 pg — ABNORMAL LOW (ref 26.0–34.0)
MCHC: 31.7 g/dL — ABNORMAL LOW (ref 32.0–36.0)
MCV: 77.2 fL — ABNORMAL LOW (ref 80.0–100.0)
PLATELETS: 169 10*3/uL (ref 150–440)
RBC: 3.18 MIL/uL — AB (ref 3.80–5.20)
RDW: 19.6 % — AB (ref 11.5–14.5)
WBC: 7.6 10*3/uL (ref 3.6–11.0)

## 2017-08-26 LAB — LIPASE, BLOOD: LIPASE: 39 U/L (ref 11–51)

## 2017-08-26 LAB — TROPONIN I: Troponin I: <0.03 ng/mL (ref <0.03)

## 2017-08-26 MED ORDER — FUROSEMIDE 10 MG/ML IJ SOLN
60.0000 mg | Freq: Once | INTRAMUSCULAR | Status: AC
Start: 2017-08-26 — End: 2017-08-26
  Administered 2017-08-26: 60 mg via INTRAVENOUS
  Filled 2017-08-26: qty 8

## 2017-08-26 NOTE — ED Notes (Signed)
Pt assisted up to commode to void.  

## 2017-08-26 NOTE — ED Notes (Signed)
[place] placed

## 2017-08-26 NOTE — ED Notes (Signed)
Pt assisted to recliner in sub wait area to wait for treatment room; call bell in reach; encouraged to use for any needs

## 2017-08-26 NOTE — ED Triage Notes (Addendum)
Pt arrived via POV with reports of ongoing weakness for the past year, states she is more weak today and states she has had swelling to her feet for several months.  Pt states "I just feel really bad"  Pt states she was in the hospital a few weeks ago and required a blood transfusion.   Pt has been missing heart failure appointments and states "I can't get out of the bed"  Pt also stated that she has been having lower abdominal pain x 3 days.

## 2017-08-26 NOTE — ED Notes (Signed)
Pt ambulatory in triage with assistance.

## 2017-08-26 NOTE — ED Provider Notes (Signed)
[facility] Emergency Department Provider Note    First MD Initiated Contact with Patient 08/26/17 1949     (approximate)  I have reviewed the triage vital signs and the nursing notes.   HISTORY  Chief Complaint Weakness and Abdominal Pain (lower)    HPI [name] is a 62 y.o. female with a history of CHF and COPD as well as diabetes status post open heart surgery in January of this year presents the ER with 1 week of worsening orthopnea shortness of breath generalized weakness and significant exertional dyspnea where she is no longer able to walk without becoming profoundly short of breath.  Did have recent hospitalization for symptomatic anemia of which she received 2 blood transfusions.  Since then she has had steady decline.  Denies any chest pain.  Reported to triage that she was having abdominal pain but denies any abdominal pain when I asked her.  No nausea or vomiting.  She is concerned about lower extremity swelling.  She is not currently taking any diuretics.    Past Medical History:  Diagnosis Date  . CHF (congestive heart failure) ([place])   . COPD (chronic obstructive pulmonary disease) ([place])   . Diabetes mellitus without complication ([place])   . NSTEMI (non-ST elevated myocardial infarction) ([place])    Family History  Problem Relation Age of Onset  . Hypertension Mother   . Diabetes Mellitus II Mother   . Heart disease Father    Past Surgical History:  Procedure Laterality Date  . APPENDECTOMY    . CARDIAC CATHETERIZATION    . CHOLECYSTECTOMY    . ESOPHAGOGASTRODUODENOSCOPY N/A 12/19/2016   Procedure: ESOPHAGOGASTRODUODENOSCOPY (EGD);  Surgeon: Lin Landsman, MD;  Location: [facility] ENDOSCOPY;  Service: Gastroenterology;  Laterality: N/A;  . ESOPHAGOGASTRODUODENOSCOPY (EGD) WITH PROPOFOL N/A 08/08/2017   Procedure: ESOPHAGOGASTRODUODENOSCOPY (EGD) WITH PROPOFOL;  Surgeon: [name], MD;  Location: [facility] ENDOSCOPY;  Service:  Endoscopy;  Laterality: N/A;  . LEFT HEART CATH AND CORONARY ANGIOGRAPHY N/A 12/23/2016   Procedure: LEFT HEART CATH AND CORONARY ANGIOGRAPHY;  Surgeon: Teodoro Spray, MD;  Location: [facility];  Service: Cardiovascular;  Laterality: N/A;   Patient Active Problem List   Diagnosis Date Noted  . Anemia   . Weakness   . GI bleed 08/07/2017  . Chronic systolic heart failure ([place]) 12/28/2016  . Diabetes ([place]) 12/28/2016  . Tobacco use 12/28/2016  . NSTEMI (non-ST elevated myocardial infarction) ([place])   . Sepsis ([place]) 12/18/2016  . CHF exacerbation ([place]) 12/04/2015  . PNA (pneumonia) 10/28/2015  . COPD exacerbation ([place]) 08/29/2015      Prior to Admission medications   Medication Sig Start Date End Date Taking? Authorizing Provider  atorvastatin (LIPITOR) 40 MG tablet Take 1 tablet (40 mg total) by mouth daily. 08/09/17 12/07/17  Salary, Avel Peace, MD  blood glucose meter kit and supplies KIT Dispense based on patient and insurance preference. Use up to four times daily as directed. (FOR ICD-9 250.00, 250.01). Patient not taking: Reported on 08/07/2017 12/24/16   Hillary Bow, MD  carvedilol (COREG) 3.125 MG tablet Take 1 tablet (3.125 mg total) by mouth 2 (two) times daily with a meal. 08/09/17   Salary, Avel Peace, MD  Insulin Glargine (LANTUS SOLOSTAR) 100 UNIT/ML Solostar Pen Inject 15 Units into the skin daily. 08/09/17   Salary, Holly Bodily D, MD  Insulin Pen Needle 32G X 5 MM MISC 1 Units by Does not apply route daily. Patient not taking: Reported on 08/07/2017 12/24/16  Hillary Bow, MD  isosorbide mononitrate (IMDUR) 30 MG 24 hr tablet Take 1 tablet (30 mg total) by mouth daily. 08/09/17   Salary, Avel Peace, MD  pantoprazole (PROTONIX) 40 MG tablet Take 1 tablet (40 mg total) by mouth 2 (two) times daily before a meal. 08/09/17   Salary, Holly Bodily D, MD  pregabalin (LYRICA) 100 MG capsule Take 1 capsule (100 mg total) by mouth 3 (three) times daily. Patient not taking: Reported on  08/07/2017 06/22/17 07/22/17  Merlyn Lot, MD  pregabalin (LYRICA) 100 MG capsule Take 1 capsule (100 mg total) by mouth 3 (three) times daily. 08/09/17 09/08/17  Salary, Avel Peace, MD    Allergies Latex and Gabapentin    Social History Social History   Tobacco Use  . Smoking status: Current Some Day Smoker    Packs/day: 0.25    Types: Cigarettes  . Smokeless tobacco: Never Used  Substance Use Topics  . Alcohol use: No  . Drug use: No    Review of Systems Patient denies headaches, rhinorrhea, blurry vision, numbness, shortness of breath, chest pain, edema, cough, abdominal pain, nausea, vomiting, diarrhea, dysuria, fevers, rashes or hallucinations unless otherwise stated above in HPI. ____________________________________________   PHYSICAL EXAM:  VITAL SIGNS: Vitals:   08/26/17 2030 08/26/17 2230  BP: (!) 153/78 (!) 148/76  Pulse: 93 92  Resp: 16 17  Temp:    SpO2: 94% 95%    Constitutional: Alert and oriented. Mild tachypnea on room air  Eyes: Conjunctivae are normal.  Head: Atraumatic. Nose: No congestion/rhinnorhea. Mouth/Throat: Mucous membranes are moist.   Neck: No stridor. Painless ROM.  Cardiovascular: Normal rate, regular rhythm. Grossly normal heart sounds.  Good peripheral circulation. Respiratory: mild tachypnea with diminished bibasilar breathsounds Gastrointestinal: Soft and nontender. No distention. No abdominal bruits. No CVA tenderness. Genitourinary:  Musculoskeletal: No lower extremity tenderness nor edema.  No joint effusions. Neurologic:  Normal speech and language. No gross focal neurologic deficits are appreciated. No facial droop Skin:  Skin is warm, dry and intact. No rash noted. Psychiatric: Mood and affect are normal. Speech and behavior are normal.  ____________________________________________   LABS (all labs ordered are listed, but only abnormal results are displayed)  Results for orders placed or performed during the hospital  encounter of 08/26/17 (from the past 24 hour(s))  Troponin I     Status: None   Collection Time: 08/26/17  5:51 PM  Result Value Ref Range   Troponin I <0.03 <0.03 ng/mL  Lipase, blood     Status: None   Collection Time: 08/26/17  5:51 PM  Result Value Ref Range   Lipase 39 11 - 51 U/L  Comprehensive metabolic panel     Status: Abnormal   Collection Time: 08/26/17  5:51 PM  Result Value Ref Range   Sodium 140 135 - 145 mmol/L   Potassium 3.8 3.5 - 5.1 mmol/L   Chloride 108 98 - 111 mmol/L   CO2 25 22 - 32 mmol/L   Glucose, Bld 142 (H) 70 - 99 mg/dL   BUN 18 8 - 23 mg/dL   Creatinine, Ser 1.05 (H) 0.44 - 1.00 mg/dL   Calcium 8.9 8.9 - 10.3 mg/dL   Total Protein 8.1 6.5 - 8.1 g/dL   Albumin 3.5 3.5 - 5.0 g/dL   AST 15 15 - 41 U/L   ALT 8 0 - 44 U/L   Alkaline Phosphatase 98 38 - 126 U/L   Total Bilirubin 0.6 0.3 - 1.2 mg/dL   GFR calc  non Af Amer 56 (L) >60 mL/min   GFR calc Af Amer >60 >60 mL/min   Anion gap 7 5 - 15  CBC     Status: Abnormal   Collection Time: 08/26/17  5:51 PM  Result Value Ref Range   WBC 7.6 3.6 - 11.0 K/uL   RBC 3.18 (L) 3.80 - 5.20 MIL/uL   Hemoglobin 7.8 (L) 12.0 - 16.0 g/dL   HCT 24.5 (L) 35.0 - 47.0 %   MCV 77.2 (L) 80.0 - 100.0 fL   MCH 24.5 (L) 26.0 - 34.0 pg   MCHC 31.7 (L) 32.0 - 36.0 g/dL   RDW 19.6 (H) 11.5 - 14.5 %   Platelets 169 150 - 440 K/uL  Brain natriuretic peptide     Status: Abnormal   Collection Time: 08/26/17  5:51 PM  Result Value Ref Range   B Natriuretic Peptide 1,095.0 (H) 0.0 - 100.0 pg/mL  Type and screen [place]     Status: None (Preliminary result)   Collection Time: 08/26/17  5:51 PM  Result Value Ref Range   ABO/RH(D) PENDING    Antibody Screen PENDING    Sample Expiration      08/29/2017 Performed at [facility], [address] [place], [place] [address]   Type and screen     Status: None   Collection Time: 08/26/17  7:03 PM  Result Value Ref Range   ABO/RH(D) O POS     Antibody Screen NEG    Sample Expiration      08/29/2017 Performed at [facility], [address] [place], [place] [address]   Urinalysis, Complete w Microscopic     Status: Abnormal   Collection Time: 08/26/17  8:53 PM  Result Value Ref Range   Color, Urine YELLOW (A) YELLOW   APPearance CLEAR (A) CLEAR   Specific Gravity, Urine 1.018 1.005 - 1.030   pH 6.0 5.0 - 8.0   Glucose, UA NEGATIVE NEGATIVE mg/dL   Hgb urine dipstick NEGATIVE NEGATIVE   Bilirubin Urine NEGATIVE NEGATIVE   Ketones, ur NEGATIVE NEGATIVE mg/dL   Protein, ur 100 (A) NEGATIVE mg/dL   Nitrite NEGATIVE NEGATIVE   Leukocytes, UA NEGATIVE NEGATIVE   RBC / HPF 0-5 0 - 5 RBC/hpf   WBC, UA 0-5 0 - 5 WBC/hpf   Bacteria, UA NONE SEEN NONE SEEN   Squamous Epithelial / LPF 6-10 0 - 5   Mucus PRESENT    ____________________________________________  EKG My review and personal interpretation at Time: 17:38   Indication: chest pain  Rate: 95  Rhythm: sinus Axis: normal Other: rbbb, otherwise normal intervals, no stemi ____________________________________________  RADIOLOGY  I personally reviewed all radiographic images ordered to evaluate for the above acute complaints and reviewed radiology reports and findings.  These findings were personally discussed with the patient.  Please see medical record for radiology report.  ____________________________________________   PROCEDURES  Procedure(s) performed:  Procedures    Critical Care performed: no ____________________________________________   INITIAL IMPRESSION / ASSESSMENT AND PLAN / ED COURSE  Pertinent labs & imaging results that were available during my care of the patient were reviewed by me and considered in my medical decision making (see chart for details).   DDX: Asthma, copd, CHF, pna, ptx, malignancy, Pe, anemia   COTY STUDENT is a 62 y.o. who presents to the ED with sx as described above.  Patient is AFVSS in ED. Exam as  above. Given current presentation have considered the  above differential.  Presentation concerning for worsening CHF with new effusion and edema.  EKG without evidence of acute ischemia.  Will give IV lasix.  Do feel patient will require admission for additional diuresis.  Hgb appears stable and patient is not having melena or hematochezia.  Have discussed with the patient and available family all diagnostics and treatments performed thus far and all questions were answered to the best of my ability. The patient demonstrates understanding and agreement with plan.       As part of my medical decision making, I reviewed the following data within the [facility] notes reviewed and incorporated, Labs reviewed, notes from prior ED visits    ____________________________________________   FINAL CLINICAL IMPRESSION(S) / ED DIAGNOSES  Final diagnoses:  Weakness  Leg swelling  Acute on chronic congestive heart failure, unspecified heart failure type ([place])      NEW MEDICATIONS STARTED DURING THIS VISIT:  New Prescriptions   No medications on file     Note:  This document was prepared using Dragon voice recognition software and may include unintentional dictation errors.    Merlyn Lot, MD 08/26/17 2306

## 2017-08-26 NOTE — ED Notes (Addendum)
Pt ambulated down hall while maintaining and oxygen saturation above 95%. However, pt very short of breath with exertion and reports feeling very weak.

## 2017-08-27 ENCOUNTER — Observation Stay
Admit: 2017-08-27 | Discharge: 2017-08-27 | Disposition: A | Discharge status: Home / Self Care | Payer: Medicaid Other | Attending: Internal Medicine | Admitting: Internal Medicine

## 2017-08-27 ENCOUNTER — Other Ambulatory Visit: Payer: Self-pay

## 2017-08-27 DIAGNOSIS — I5023 Acute on chronic systolic (congestive) heart failure: Secondary | ICD-10-CM | POA: Diagnosis present

## 2017-08-27 LAB — GLUCOSE, CAPILLARY
Glucose-Capillary: 144 mg/dL — ABNORMAL HIGH (ref 70–99)
Glucose-Capillary: 121 mg/dL — ABNORMAL HIGH (ref 70–99)
Glucose-Capillary: 137 mg/dL — ABNORMAL HIGH (ref 70–99)
Glucose-Capillary: 165 mg/dL — ABNORMAL HIGH (ref 70–99)

## 2017-08-27 LAB — TSH: TSH: 1.577 u[IU]/mL (ref 0.350–4.500)

## 2017-08-27 MED ORDER — INSULIN GLARGINE 100 UNIT/ML ~~LOC~~ SOLN
10.0000 [IU] | Freq: Every day | SUBCUTANEOUS | Status: DC
  Administered 2017-08-27 – 2017-08-28 (×2): 10 [IU] via SUBCUTANEOUS
  Filled 2017-08-27 (×3): qty 0.1

## 2017-08-27 MED ORDER — DOCUSATE SODIUM 100 MG PO CAPS
100.0000 mg | ORAL_CAPSULE | Freq: Two times a day (BID) | ORAL | Status: DC
  Administered 2017-08-27 – 2017-08-29 (×5): 100 mg via ORAL
  Filled 2017-08-27 (×5): qty 1

## 2017-08-27 MED ORDER — CARVEDILOL 3.125 MG PO TABS
3.1250 mg | ORAL_TABLET | Freq: Two times a day (BID) | ORAL | Status: DC
  Administered 2017-08-27 – 2017-08-29 (×5): 3.125 mg via ORAL
  Filled 2017-08-27 (×6): qty 1

## 2017-08-27 MED ORDER — ISOSORBIDE MONONITRATE ER 30 MG PO TB24
30.0000 mg | ORAL_TABLET | Freq: Every day | ORAL | Status: DC
  Administered 2017-08-27 – 2017-08-29 (×3): 30 mg via ORAL
  Filled 2017-08-27 (×3): qty 1

## 2017-08-27 MED ORDER — PANTOPRAZOLE SODIUM 40 MG PO TBEC
40.0000 mg | DELAYED_RELEASE_TABLET | Freq: Two times a day (BID) | ORAL | Status: DC
  Administered 2017-08-27 – 2017-08-29 (×6): 40 mg via ORAL
  Filled 2017-08-27 (×6): qty 1

## 2017-08-27 MED ORDER — ENOXAPARIN SODIUM 40 MG/0.4ML ~~LOC~~ SOLN
40.0000 mg | SUBCUTANEOUS | Status: DC
  Administered 2017-08-27 – 2017-08-29 (×3): 40 mg via SUBCUTANEOUS
  Filled 2017-08-27 (×3): qty 0.4

## 2017-08-27 MED ORDER — FUROSEMIDE 10 MG/ML IJ SOLN
40.0000 mg | Freq: Four times a day (QID) | INTRAMUSCULAR | Status: DC
  Administered 2017-08-27 (×2): 40 mg via INTRAVENOUS
  Filled 2017-08-27 (×2): qty 4

## 2017-08-27 MED ORDER — ONDANSETRON HCL 4 MG PO TABS: 4.0000 mg | ORAL_TABLET | Freq: Four times a day (QID) | ORAL | Status: DC | PRN

## 2017-08-27 MED ORDER — ACETAMINOPHEN 325 MG PO TABS: 650.0000 mg | ORAL_TABLET | Freq: Four times a day (QID) | ORAL | Status: DC | PRN

## 2017-08-27 MED ORDER — ATORVASTATIN CALCIUM 20 MG PO TABS
40.0000 mg | ORAL_TABLET | Freq: Every day | ORAL | Status: DC
  Administered 2017-08-27 – 2017-08-29 (×3): 40 mg via ORAL
  Filled 2017-08-27 (×3): qty 2

## 2017-08-27 MED ORDER — ACETAMINOPHEN 650 MG RE SUPP: 650.0000 mg | Freq: Four times a day (QID) | RECTAL | Status: DC | PRN

## 2017-08-27 MED ORDER — INSULIN ASPART 100 UNIT/ML ~~LOC~~ SOLN
0.0000 [IU] | Freq: Three times a day (TID) | SUBCUTANEOUS | Status: DC
  Administered 2017-08-27 – 2017-08-28 (×4): 1 [IU] via SUBCUTANEOUS
  Administered 2017-08-28: 2 [IU] via SUBCUTANEOUS
  Administered 2017-08-29: 1 [IU] via SUBCUTANEOUS
  Filled 2017-08-27 (×6): qty 1

## 2017-08-27 MED ORDER — ONDANSETRON HCL 4 MG/2ML IJ SOLN: 4.0000 mg | Freq: Four times a day (QID) | INTRAMUSCULAR | Status: DC | PRN

## 2017-08-27 MED ORDER — PREGABALIN 50 MG PO CAPS
100.0000 mg | ORAL_CAPSULE | Freq: Three times a day (TID) | ORAL | Status: DC
  Administered 2017-08-27 – 2017-08-29 (×8): 100 mg via ORAL
  Filled 2017-08-27 (×8): qty 2

## 2017-08-27 MED ORDER — FUROSEMIDE 10 MG/ML IJ SOLN
40.0000 mg | Freq: Three times a day (TID) | INTRAMUSCULAR | Status: DC
  Administered 2017-08-27 – 2017-08-28 (×4): 40 mg via INTRAVENOUS
  Filled 2017-08-27 (×4): qty 4

## 2017-08-27 NOTE — H&P (Signed)
[name] is an 62 y.o. female.   Chief Complaint: Weakness HPI: The patient with past medical history of CAD status post CABG CHF, COPD and diabetes presents to the emergency department complaining of weakness.  The patient states that she has had generalized weakness as well as fatigue and lower extremity edema for more than a month but it has worsened in the last week.  She states that she has never fully recovered from coronary revascularization in January 2019.  The patient admits to orthopnea as well as some abdominal pain during this last week.  In the emergency department she received Lasix 60 mg IV and has had good urine output.  However, due to her abdominal pain/vague chest pain as well as continued orthopnea the emergency department staff called the hospitalist service for admission.  Past Medical History:  Diagnosis Date  . CHF (congestive heart failure) ([place])   . COPD (chronic obstructive pulmonary disease) ([place])   . Diabetes mellitus without complication ([place])   . NSTEMI (non-ST elevated myocardial infarction) [facility])     Past Surgical History:  Procedure Laterality Date  . APPENDECTOMY    . CARDIAC CATHETERIZATION    . CHOLECYSTECTOMY    . ESOPHAGOGASTRODUODENOSCOPY N/A 12/19/2016   Procedure: ESOPHAGOGASTRODUODENOSCOPY (EGD);  Surgeon: Lin Landsman, MD;  Location: [facility] ENDOSCOPY;  Service: Gastroenterology;  Laterality: N/A;  . ESOPHAGOGASTRODUODENOSCOPY (EGD) WITH PROPOFOL N/A 08/08/2017   Procedure: ESOPHAGOGASTRODUODENOSCOPY (EGD) WITH PROPOFOL;  Surgeon: [name], MD;  Location: [facility] ENDOSCOPY;  Service: Endoscopy;  Laterality: N/A;  . LEFT HEART CATH AND CORONARY ANGIOGRAPHY N/A 12/23/2016   Procedure: LEFT HEART CATH AND CORONARY ANGIOGRAPHY;  Surgeon: Teodoro Spray, MD;  Location: [facility] CV LAB;  Service: Cardiovascular;  Laterality: N/A;    Family History  Problem Relation Age of Onset  . Hypertension Mother   . Diabetes Mellitus II Mother    . Heart disease Father    Social History:  reports that she has been smoking cigarettes. She has been smoking about 0.25 packs per day. She has never used smokeless tobacco. She reports that she does not drink alcohol or use drugs.  Allergies:  Allergies  Allergen Reactions  . Latex Anaphylaxis and Rash  . Gabapentin Nausea And Vomiting and Other (See Comments)    Medications Prior to Admission  Medication Sig Dispense Refill  . atorvastatin (LIPITOR) 40 MG tablet Take 1 tablet (40 mg total) by mouth daily. 30 tablet 0  . blood glucose meter kit and supplies KIT Dispense based on patient and insurance preference. Use up to four times daily as directed. (FOR ICD-9 250.00, 250.01). 1 each 0  . carvedilol (COREG) 3.125 MG tablet Take 1 tablet (3.125 mg total) by mouth 2 (two) times daily with a meal. 60 tablet 0  . Insulin Glargine (LANTUS SOLOSTAR) 100 UNIT/ML Solostar Pen Inject 15 Units into the skin daily. 300 mL 0  . Insulin Pen Needle 32G X 5 MM MISC 1 Units by Does not apply route daily. 100 each 0  . isosorbide mononitrate (IMDUR) 30 MG 24 hr tablet Take 1 tablet (30 mg total) by mouth daily. 30 tablet 0  . pantoprazole (PROTONIX) 40 MG tablet Take 1 tablet (40 mg total) by mouth 2 (two) times daily before a meal. 60 tablet 0  . pregabalin (LYRICA) 100 MG capsule Take 1 capsule (100 mg total) by mouth 3 (three) times daily. 90 capsule 0  . pregabalin (LYRICA) 100 MG capsule Take 1 capsule (100 mg  total) by mouth 3 (three) times daily. (Patient not taking: Reported on 08/07/2017) 90 capsule 0    Results for orders placed or performed during the hospital encounter of 08/26/17 (from the past 48 hour(s))  Troponin I     Status: None   Collection Time: 08/26/17  5:51 PM  Result Value Ref Range   Troponin I <0.03 <0.03 ng/mL    Comment: Performed at [facility], [address] [place], [place] [address]  Lipase, blood     Status: None   Collection Time: 08/26/17  5:51 PM   Result Value Ref Range   Lipase 39 11 - 51 U/L    Comment: Performed at [facility], [address] [place], [place] [address]  Comprehensive metabolic panel     Status: Abnormal   Collection Time: 08/26/17  5:51 PM  Result Value Ref Range   Sodium 140 135 - 145 mmol/L   Potassium 3.8 3.5 - 5.1 mmol/L   Chloride 108 98 - 111 mmol/L   CO2 25 22 - 32 mmol/L   Glucose, Bld 142 (H) 70 - 99 mg/dL   BUN 18 8 - 23 mg/dL   Creatinine, Ser 1.05 (H) 0.44 - 1.00 mg/dL   Calcium 8.9 8.9 - 10.3 mg/dL   Total Protein 8.1 6.5 - 8.1 g/dL   Albumin 3.5 3.5 - 5.0 g/dL   AST 15 15 - 41 U/L   ALT 8 0 - 44 U/L   Alkaline Phosphatase 98 38 - 126 U/L   Total Bilirubin 0.6 0.3 - 1.2 mg/dL   GFR calc non Af Amer 56 (L) >60 mL/min   GFR calc Af Amer >60 >60 mL/min    Comment: (NOTE) The eGFR has been calculated using the CKD EPI equation. This calculation has not been validated in all clinical situations. eGFR's persistently <60 mL/min signify possible Chronic Kidney Disease.    Anion gap 7 5 - 15    Comment: Performed at [facility], [address] [place], [place] [address]  CBC     Status: Abnormal   Collection Time: 08/26/17  5:51 PM  Result Value Ref Range   WBC 7.6 3.6 - 11.0 K/uL   RBC 3.18 (L) 3.80 - 5.20 MIL/uL   Hemoglobin 7.8 (L) 12.0 - 16.0 g/dL   HCT 24.5 (L) 35.0 - 47.0 %   MCV 77.2 (L) 80.0 - 100.0 fL   MCH 24.5 (L) 26.0 - 34.0 pg   MCHC 31.7 (L) 32.0 - 36.0 g/dL   RDW 19.6 (H) 11.5 - 14.5 %   Platelets 169 150 - 440 K/uL    Comment: Performed at [facility], [address] [place], [place] [address]  Brain natriuretic peptide     Status: Abnormal   Collection Time: 08/26/17  5:51 PM  Result Value Ref Range   B Natriuretic Peptide 1,095.0 (H) 0.0 - 100.0 pg/mL    Comment: Performed at [facility], [address] [place], [place] [address]  Type and screen [place]     Status: None (Preliminary result)    Collection Time: 08/26/17  5:51 PM  Result Value Ref Range   ABO/RH(D) PENDING    Antibody Screen PENDING    Sample Expiration      08/29/2017 Performed at [facility], [address] [place],  [address]   TSH     Status: None   Collection Time: 08/26/17  5:51 PM  Result Value Ref Range  TSH 1.577 0.350 - 4.500 uIU/mL    Comment: Performed by a 3rd Generation assay with a functional sensitivity of <=0.01 uIU/mL. Performed at [facility], [address] [place], [place] [address]   Type and screen     Status: None   Collection Time: 08/26/17  7:03 PM  Result Value Ref Range   ABO/RH(D) O POS    Antibody Screen NEG    Sample Expiration      08/29/2017 Performed at [facility], [address] [place], [place] [address]   Urinalysis, Complete w Microscopic     Status: Abnormal   Collection Time: 08/26/17  8:53 PM  Result Value Ref Range   Color, Urine YELLOW (A) YELLOW   APPearance CLEAR (A) CLEAR   Specific Gravity, Urine 1.018 1.005 - 1.030   pH 6.0 5.0 - 8.0   Glucose, UA NEGATIVE NEGATIVE mg/dL   Hgb urine dipstick NEGATIVE NEGATIVE   Bilirubin Urine NEGATIVE NEGATIVE   Ketones, ur NEGATIVE NEGATIVE mg/dL   Protein, ur 100 (A) NEGATIVE mg/dL   Nitrite NEGATIVE NEGATIVE   Leukocytes, UA NEGATIVE NEGATIVE   RBC / HPF 0-5 0 - 5 RBC/hpf   WBC, UA 0-5 0 - 5 WBC/hpf   Bacteria, UA NONE SEEN NONE SEEN   Squamous Epithelial / LPF 6-10 0 - 5   Mucus PRESENT     Comment: Performed at [facility], [address] [place],  [address]   Dg Chest 2 View  Result Date: 08/26/2017 CLINICAL DATA:  Ongoing weakness for the past year, more so today with swelling of the feet for several months. EXAM: CHEST - 2 VIEW COMPARISON:  06/22/2017 FINDINGS: Stable cardiomegaly with aortic valvular replacement. Median sternotomy fixation hardware and post CABG clips are present. Streaky right infrahilar and left basilar bandlike opacities  likely represent areas of atelectasis. Mild diffuse interstitial edema with small to moderate right effusion is noted, new since prior. No acute osseous abnormality. Degenerative changes are present along the dorsal spine. IMPRESSION: 1. Stable cardiomegaly with aortic atherosclerosis. 2. Interval development of interstitial edema/CHF with small right effusion and bibasilar atelectasis. Electronically Signed   By: Ashley Royalty M.D.   On: 08/26/2017 18:35    Review of Systems  Constitutional: Negative for chills and fever.  HENT: Negative for sore throat and tinnitus.   Eyes: Negative for blurred vision and redness.  Respiratory: Positive for shortness of breath. Negative for cough.   Cardiovascular: Positive for orthopnea and leg swelling. Negative for chest pain, palpitations and PND.  Gastrointestinal: Negative for abdominal pain, diarrhea, nausea and vomiting.  Genitourinary: Negative for dysuria, frequency and urgency.  Musculoskeletal: Negative for joint pain and myalgias.  Skin: Negative for rash.       No lesions  Neurological: Negative for speech change, focal weakness and weakness.  Endo/Heme/Allergies: Does not bruise/bleed easily.       No temperature intolerance  Psychiatric/Behavioral: Negative for depression and suicidal ideas.    Blood pressure (!) 147/80, pulse 98, temperature 98.7 F (37.1 C), temperature source Oral, resp. rate 18, height 5' 5"  (1.651 m), weight 77.3 kg, SpO2 100 %. Physical Exam  Vitals reviewed. Constitutional: She is oriented to person, place, and time. She appears well-developed and well-nourished. No distress.  HENT:  Head: Normocephalic and atraumatic.  Mouth/Throat: Oropharynx is clear and moist.  Eyes: Pupils are equal, round, and reactive to light. Conjunctivae and EOM are normal. No scleral icterus.  Neck: Normal range of motion. Neck  supple. No JVD present. No tracheal deviation present. No thyromegaly present.  Cardiovascular: Normal rate,  regular rhythm and normal heart sounds. Exam reveals no gallop and no friction rub.  No murmur heard. Respiratory: Effort normal and breath sounds normal.  GI: Soft. Bowel sounds are normal. She exhibits no distension. There is no tenderness.  Genitourinary:  Genitourinary Comments: Deferred  Musculoskeletal: Normal range of motion. She exhibits edema.  Lymphadenopathy:    She has no cervical adenopathy.  Neurological: She is alert and oriented to person, place, and time. No cranial nerve deficit. She exhibits normal muscle tone.  Skin: Skin is warm and dry. No rash noted. No erythema.  Psychiatric: She has a normal mood and affect. Her behavior is normal. Judgment and thought content normal.     Assessment/Plan This is a 62 year old female admitted for CHF exacerbation. 1.  CHF: Acute on chronic; systolic.  Last EF 40 to 45%.  This was obtained prior to CABG.  Recheck echocardiogram.  Consult cardiology.  Continue Lasix 40 mg IV every 6 hours until the patient is euvolemic.  She has never received cardiac rehab as [place].  She would greatly benefit from CHF clinic as well as cardiac rehabilitation.  Discharge planning consult ordered. 2.  CAD: Stable; continue Imdur 3.  Diabetes mellitus type 2: Continue basal insulin therapy as well as sliding scale insulin 4.  Hypertension: Controlled; continue carvedilol 5.  DVT prophylaxis: Lovenox 6.  GI prophylaxis: Pantoprazole per home regimen The patient is a full code.  Time spent on admission orders and patient care approximately 45 minutes  Harrie Foreman, MD 08/27/2017, 7:11 AM

## 2017-08-27 NOTE — ED Notes (Signed)
Continuing to await for admission.

## 2017-08-27 NOTE — ED Notes (Signed)
Pt denies needs, explanation of admission process provided to pt who verbalizes understanding.

## 2017-08-27 NOTE — Plan of Care (Addendum)
No c/o pain throughout shift, patient states she is feeling better, diuresing well.   Problem: Education: Goal: Knowledge of General Education information will improve Description Including pain rating scale, medication(s)/side effects and non-pharmacologic comfort measures Outcome: Progressing   Problem: Clinical Measurements: Goal: Diagnostic test results will improve Outcome: Progressing Goal: Respiratory complications will improve Outcome: Progressing   Problem: Activity: Goal: Risk for activity intolerance will decrease Outcome: Progressing   Problem: Nutrition: Goal: Adequate nutrition will be maintained Outcome: Progressing   Problem: Coping: Goal: Level of anxiety will decrease Outcome: Progressing

## 2017-08-27 NOTE — Progress Notes (Signed)
Advanced care plan.  Purpose of the Encounter: CODE STATUS  Parties in Attendance: Herself  Patient's Decision Capacity: Intact  Subjective/Patient's story: Patient 62 year old with history of coronary artery disease now admitted with acute CHF   Objective/Medical story Discussed with the patient regarding her desire for resuscitation cardiac and pulmonary She would like everything done  Goals of care determination:  Full code   CODE STATUS: Full code   Time spent discussing advanced care planning: 16 minutes

## 2017-08-27 NOTE — ED Notes (Signed)
Pt denies needs.

## 2017-08-27 NOTE — Progress Notes (Signed)
[place] at [facility]                                                                                                                                                                                  Patient Demographics   [name], is a 62 y.o. female, DOB - 08-Nov-1955, WTU:[id number]  Admit date - 08/26/2017   Admitting Physician Harrie Foreman, MD  Outpatient Primary MD for the patient is [place] [place] [facility]   LOS - 0  Subjective: Patient admitted with shortness of breath and significant swelling.  She has diuresed well with IV Lasix Feeling improved   Review of Systems:   CONSTITUTIONAL: No documented fever. No fatigue, weakness. No weight gain, no weight loss.  EYES: No blurry or double vision.  ENT: No tinnitus. No postnasal drip. No redness of the oropharynx.  RESPIRATORY: No cough, no wheeze, no hemoptysis.  Positive dyspnea.  CARDIOVASCULAR: No chest pain. No orthopnea. No palpitations. No syncope.  GASTROINTESTINAL: No nausea, no vomiting or diarrhea. No abdominal pain. No melena or hematochezia.  GENITOURINARY: No dysuria or hematuria.  ENDOCRINE: No polyuria or nocturia. No heat or cold intolerance.  HEMATOLOGY: No anemia. No bruising. No bleeding.  INTEGUMENTARY: No rashes. No lesions.  MUSCULOSKELETAL: No arthritis. No swelling. No gout.  NEUROLOGIC: No numbness, tingling, or ataxia. No seizure-type activity.  PSYCHIATRIC: No anxiety. No insomnia. No ADD.    Vitals:   Vitals:   08/27/17 0245 08/27/17 0300 08/27/17 0352 08/27/17 0836  BP:  (!) 152/83 (!) 147/80 131/67  Pulse: 92 95 98 89  Resp: 18 18 18 16   Temp:   98.7 F (37.1 C) 98.3 F (36.8 C)  TempSrc:   Oral Oral  SpO2: 93% 93% 100% 100%  Weight:   77.3 kg   Height:   5' 5"  (1.651 m)     Wt Readings from Last 3 Encounters:  08/27/17 77.3 kg  08/07/17 72.6 kg  06/22/17 72.6 kg     Intake/Output Summary (Last 24 hours) at 08/27/2017 1519 Last  data filed at 08/27/2017 1236 Gross per 24 hour  Intake 240 ml  Output 3750 ml  Net -3510 ml    Physical Exam:   GENERAL: Pleasant-appearing in no apparent distress.  HEAD, EYES, EARS, NOSE AND THROAT: Atraumatic, normocephalic. Extraocular muscles are intact. Pupils equal and reactive to light. Sclerae anicteric. No conjunctival injection. No oro-pharyngeal erythema.  NECK: Supple. There is no jugular venous distention. No bruits, no lymphadenopathy, no thyromegaly.  HEART: Regular rate and rhythm,. No murmurs, no rubs, no clicks.  LUNGS: Bilateral crackles throughout both lungs ABDOMEN: Soft, flat, nontender, nondistended. Has good bowel sounds.  No hepatosplenomegaly appreciated.  EXTREMITIES: No evidence of any cyanosis, clubbing, or peripheral edema.  +2 pedal and radial pulses bilaterally.  NEUROLOGIC: The patient is alert, awake, and oriented x3 with no focal motor or sensory deficits appreciated bilaterally.  SKIN: Moist and warm with no rashes appreciated.  Psych: Not anxious, depressed LN: No inguinal LN enlargement    Antibiotics   Anti-infectives (From admission, onward)   None      Medications   Scheduled Meds: . atorvastatin  40 mg Oral Daily  . carvedilol  3.125 mg Oral BID WC  . docusate sodium  100 mg Oral BID  . enoxaparin (LOVENOX) injection  40 mg Subcutaneous Q24H  . furosemide  40 mg Intravenous Q8H  . insulin aspart  0-9 Units Subcutaneous TID WC  . insulin glargine  10 Units Subcutaneous QHS  . isosorbide mononitrate  30 mg Oral Daily  . pantoprazole  40 mg Oral BID AC  . pregabalin  100 mg Oral TID   Continuous Infusions: PRN Meds:.acetaminophen **OR** acetaminophen, ondansetron **OR** ondansetron (ZOFRAN) IV   Data Review:   Micro Results No results found for this or any previous visit (from the past 240 hour(s)).  Radiology Reports Dg Chest 2 View  Result Date: 08/26/2017 CLINICAL DATA:  Ongoing weakness for the past year, more so today  with swelling of the feet for several months. EXAM: CHEST - 2 VIEW COMPARISON:  06/22/2017 FINDINGS: Stable cardiomegaly with aortic valvular replacement. Median sternotomy fixation hardware and post CABG clips are present. Streaky right infrahilar and left basilar bandlike opacities likely represent areas of atelectasis. Mild diffuse interstitial edema with small to moderate right effusion is noted, new since prior. No acute osseous abnormality. Degenerative changes are present along the dorsal spine. IMPRESSION: 1. Stable cardiomegaly with aortic atherosclerosis. 2. Interval development of interstitial edema/CHF with small right effusion and bibasilar atelectasis. Electronically Signed   By: Ashley Royalty M.D.   On: 08/26/2017 18:35     CBC Recent Labs  Lab 08/26/17 1751  WBC 7.6  HGB 7.8*  HCT 24.5*  PLT 169  MCV 77.2*  MCH 24.5*  MCHC 31.7*  RDW 19.6*    Chemistries  Recent Labs  Lab 08/26/17 1751  NA 140  K 3.8  CL 108  CO2 25  GLUCOSE 142*  BUN 18  CREATININE 1.05*  CALCIUM 8.9  AST 15  ALT 8  ALKPHOS 98  BILITOT 0.6   ------------------------------------------------------------------------------------------------------------------ estimated creatinine clearance is 57.1 mL/min (A) (by C-G formula based on SCr of 1.05 mg/dL (H)). ------------------------------------------------------------------------------------------------------------------ No results for input(s): HGBA1C in the last 72 hours. ------------------------------------------------------------------------------------------------------------------ No results for input(s): CHOL, HDL, LDLCALC, TRIG, CHOLHDL, LDLDIRECT in the last 72 hours. ------------------------------------------------------------------------------------------------------------------ Recent Labs    08/26/17 1751  TSH 1.577    ------------------------------------------------------------------------------------------------------------------ No results for input(s): VITAMINB12, FOLATE, FERRITIN, TIBC, IRON, RETICCTPCT in the last 72 hours.  Coagulation profile No results for input(s): INR, PROTIME in the last 168 hours.  No results for input(s): DDIMER in the last 72 hours.  Cardiac Enzymes Recent Labs  Lab 08/26/17 1751  TROPONINI <0.03   ------------------------------------------------------------------------------------------------------------------ Invalid input(s): POCBNP    Assessment & Plan   This is a 62 year old female admitted for CHF exacerbation. 1.  CHF: Acute on chronic; systolic.  We will continue IV Lasix I will change frequency to every 8 hours, await results of the echo of the heart 2.  CAD: Stable; continue Imdur 3.  Diabetes mellitus type 2: Continue basal insulin therapy  as well as sliding scale insulin 4.  Hypertension: Controlled; continue carvedilol 5.  DVT prophylaxis: Lovenox 6.  GI prophylaxis: Pantoprazole per home regimen     Code Status Orders  (From admission, onward)         Start     Ordered   08/27/17 0348  Full code  Continuous     08/27/17 0347        Code Status History    Date Active Date Inactive Code Status Order ID Comments User Context   08/07/2017 1704 08/09/2017 2237 Full Code [id number]  Saundra Shelling, MD Inpatient   12/18/2016 1146 12/26/2016 2032 Full Code [id number]  Vaughan Basta, MD Inpatient   12/18/2016 0959 12/18/2016 1146 Full Code [id number]  Vaughan Basta, MD ED   12/04/2015 0509 12/07/2015 1756 Full Code [id number]  Holley Raring, NP ED   10/28/2015 1640 10/29/2015 1711 Full Code [id number]  Bettey Costa, MD Inpatient   08/29/2015 1438 08/30/2015 1750 Full Code [id number]  Hugelmeyer, Ubaldo Glassing, DO Inpatient           Consults cardiology  DVT Prophylaxis Lovenox  Lab Results  Component Value Date   PLT 169  08/26/2017     Time Spent in minutes 35 minutes greater than 50% of time spent in care coordination and counseling patient regarding the condition and plan of care.   Dustin Flock M.D on 08/27/2017 at 3:19 PM  Between 7am to 6pm - Pager - [phone]  After 6pm go to [email or web address] - Proofreader  [employer]   Office  [phone]

## 2017-08-27 NOTE — Progress Notes (Signed)
HF Clinic appointment scheduled on 09/04/17 at 9:40am.  Of note, she has not shown for 4 previously scheduled appointments.

## 2017-08-27 NOTE — ED Notes (Signed)
Transporting patient to Rm 252

## 2017-08-28 LAB — GLUCOSE, CAPILLARY
Glucose-Capillary: 117 mg/dL — ABNORMAL HIGH (ref 70–99)
Glucose-Capillary: 151 mg/dL — ABNORMAL HIGH (ref 70–99)
Glucose-Capillary: 146 mg/dL — ABNORMAL HIGH (ref 70–99)
Glucose-Capillary: 112 mg/dL — ABNORMAL HIGH (ref 70–99)

## 2017-08-28 LAB — BASIC METABOLIC PANEL
Anion gap: 10 (ref 5–15)
BUN: 26 mg/dL — AB (ref 8–23)
CHLORIDE: 100 mmol/L (ref 98–111)
CO2: 28 mmol/L (ref 22–32)
CREATININE: 1.6 mg/dL — AB (ref 0.44–1.00)
Calcium: 8.3 mg/dL — ABNORMAL LOW (ref 8.9–10.3)
GFR calc non Af Amer: 33 mL/min — ABNORMAL LOW (ref >60)
GFR, EST AFRICAN AMERICAN: 39 mL/min — AB (ref >60)
Glucose, Bld: 101 mg/dL — ABNORMAL HIGH (ref 70–99)
POTASSIUM: 3.7 mmol/L (ref 3.5–5.1)
Sodium: 138 mmol/L (ref 135–145)

## 2017-08-28 LAB — ECHOCARDIOGRAM COMPLETE
Height: 65 in
Weight: 2726.4 oz

## 2017-08-28 MED ORDER — FUROSEMIDE 10 MG/ML IJ SOLN
40.0000 mg | Freq: Two times a day (BID) | INTRAMUSCULAR | Status: DC
  Administered 2017-08-29: 40 mg via INTRAVENOUS
  Filled 2017-08-28 (×2): qty 4

## 2017-08-28 NOTE — Plan of Care (Addendum)
No complains throughout shift, diuresing well, pt resting in bed comfortably.   Problem: Education: Goal: Knowledge of General Education information will improve Description Including pain rating scale, medication(s)/side effects and non-pharmacologic comfort measures Outcome: Progressing   Problem: Clinical Measurements: Goal: Will remain free from infection Outcome: Progressing Goal: Diagnostic test results will improve Outcome: Progressing Goal: Respiratory complications will improve Outcome: Progressing

## 2017-08-28 NOTE — Plan of Care (Signed)
  Problem: Activity: Goal: Risk for activity intolerance will decrease Outcome: Progressing   Problem: Nutrition: Goal: Adequate nutrition will be maintained Outcome: Progressing   Problem: Coping: Goal: Level of anxiety will decrease Outcome: Progressing   Problem: Pain Managment: Goal: General experience of comfort will improve Outcome: Progressing   Problem: Safety: Goal: Ability to remain free from injury will improve Outcome: Progressing   Problem: Skin Integrity: Goal: Risk for impaired skin integrity will decrease Outcome: Progressing

## 2017-08-28 NOTE — Care Management Note (Signed)
Case Management Note  Patient Details  Name: LORRIANN HANSMANN MRN: [id number] Date of Birth: 06-May-1955  Subjective/Objective:                 Admitted with congestive heart failure.  She has not kept appointments with the Heart Failure Clinic that have been made.  She has not followed up obtaining pcp.  CM observed her medicaid Armed forces training and education officer on card is [employer] in [place].  Discussed with patient that she needs to follow up with this clinic until she can change the practice with medicaid to another of her choosing.  Provided instructions on how to do so. She denies that  there are transportation issues that prevent her from keeping appointments. When asked about not keeping appointments with the heart failure clinics, she responsds that it was not because of transportation.  It is because she was feeling to weak and tired. She says she has scales and a permanent home environment.  She has a walker.   Action/Plan: Have unit secretary make appointment asap at [facility]    Expected Discharge Date:                  Expected Discharge Plan:       In-House Referral:     Discharge planning Services     Post Acute Care Choice:    Choice offered to:     DME Arranged:    DME Agency:     HH Arranged:    [place] Agency:     Status of Service:     If discussed at [employer], dates discussed:    Additional Comments:  Katrina Stack, RN 08/28/2017, 4:49 PM

## 2017-08-28 NOTE — Progress Notes (Signed)
[place] at [facility]                                                                                                                                                                                  Patient Demographics   [name], is a 62 y.o. female, DOB - [birth year], BTD:[id number]  Admit date - 08/26/2017   Admitting Physician Harrie Foreman, MD  Outpatient Primary MD for the patient is [place] [place] [facility]   LOS - 0  Subjective: Patient is breathing is improved continues to diurese well  Review of Systems:   CONSTITUTIONAL: No documented fever. No fatigue, weakness. No weight gain, no weight loss.  EYES: No blurry or double vision.  ENT: No tinnitus. No postnasal drip. No redness of the oropharynx.  RESPIRATORY: No cough, no wheeze, no hemoptysis.  Positive dyspnea.  CARDIOVASCULAR: No chest pain. No orthopnea. No palpitations. No syncope.  GASTROINTESTINAL: No nausea, no vomiting or diarrhea. No abdominal pain. No melena or hematochezia.  GENITOURINARY: No dysuria or hematuria.  ENDOCRINE: No polyuria or nocturia. No heat or cold intolerance.  HEMATOLOGY: No anemia. No bruising. No bleeding.  INTEGUMENTARY: No rashes. No lesions.  MUSCULOSKELETAL: No arthritis. No swelling. No gout.  NEUROLOGIC: No numbness, tingling, or ataxia. No seizure-type activity.  PSYCHIATRIC: No anxiety. No insomnia. No ADD.    Vitals:   Vitals:   08/27/17 2002 08/28/17 0500 08/28/17 0624 08/28/17 0810  BP: 109/61  (!) 112/59 118/66  Pulse: 84  84 82  Resp:   18 18  Temp: 98.6 F (37 C)  98.4 F (36.9 C) 98.5 F (36.9 C)  TempSrc: Oral  Oral Oral  SpO2: 100%  100% 100%  Weight:  76.2 kg    Height:        Wt Readings from Last 3 Encounters:  08/28/17 76.2 kg  08/07/17 72.6 kg  06/22/17 72.6 kg     Intake/Output Summary (Last 24 hours) at 08/28/2017 1506 Last data filed at 08/28/2017 1249 Gross per 24 hour  Intake 0 ml  Output 1600  ml  Net -1600 ml    Physical Exam:   GENERAL: Pleasant-appearing in no apparent distress.  HEAD, EYES, EARS, NOSE AND THROAT: Atraumatic, normocephalic. Extraocular muscles are intact. Pupils equal and reactive to light. Sclerae anicteric. No conjunctival injection. No oro-pharyngeal erythema.  NECK: Supple. There is no jugular venous distention. No bruits, no lymphadenopathy, no thyromegaly.  HEART: Regular rate and rhythm,. No murmurs, no rubs, no clicks.  LUNGS: Bilateral crackles throughout both lungs ABDOMEN: Soft, flat, nontender, nondistended. Has good bowel sounds. No hepatosplenomegaly appreciated.  EXTREMITIES: No evidence of any cyanosis, clubbing, or  peripheral edema.  +2 pedal and radial pulses bilaterally.  NEUROLOGIC: The patient is alert, awake, and oriented x3 with no focal motor or sensory deficits appreciated bilaterally.  SKIN: Moist and warm with no rashes appreciated.  Psych: Not anxious, depressed LN: No inguinal LN enlargement    Antibiotics   Anti-infectives (From admission, onward)   None      Medications   Scheduled Meds: . atorvastatin  40 mg Oral Daily  . carvedilol  3.125 mg Oral BID WC  . docusate sodium  100 mg Oral BID  . enoxaparin (LOVENOX) injection  40 mg Subcutaneous Q24H  . [START ON 08/29/2017] furosemide  40 mg Intravenous BID  . insulin aspart  0-9 Units Subcutaneous TID WC  . insulin glargine  10 Units Subcutaneous QHS  . isosorbide mononitrate  30 mg Oral Daily  . pantoprazole  40 mg Oral BID AC  . pregabalin  100 mg Oral TID   Continuous Infusions: PRN Meds:.acetaminophen **OR** acetaminophen, ondansetron **OR** ondansetron (ZOFRAN) IV   Data Review:   Micro Results No results found for this or any previous visit (from the past 240 hour(s)).  Radiology Reports Dg Chest 2 View  Result Date: 08/26/2017 CLINICAL DATA:  Ongoing weakness for the past year, more so today with swelling of the feet for several months. EXAM: CHEST  - 2 VIEW COMPARISON:  06/22/2017 FINDINGS: Stable cardiomegaly with aortic valvular replacement. Median sternotomy fixation hardware and post CABG clips are present. Streaky right infrahilar and left basilar bandlike opacities likely represent areas of atelectasis. Mild diffuse interstitial edema with small to moderate right effusion is noted, new since prior. No acute osseous abnormality. Degenerative changes are present along the dorsal spine. IMPRESSION: 1. Stable cardiomegaly with aortic atherosclerosis. 2. Interval development of interstitial edema/CHF with small right effusion and bibasilar atelectasis. Electronically Signed   By: Ashley Royalty M.D.   On: 08/26/2017 18:35     CBC Recent Labs  Lab 08/26/17 1751  WBC 7.6  HGB 7.8*  HCT 24.5*  PLT 169  MCV 77.2*  MCH 24.5*  MCHC 31.7*  RDW 19.6*    Chemistries  Recent Labs  Lab 08/26/17 1751 08/28/17 1340  NA 140 138  K 3.8 3.7  CL 108 100  CO2 25 28  GLUCOSE 142* 101*  BUN 18 26*  CREATININE 1.05* 1.60*  CALCIUM 8.9 8.3*  AST 15  --   ALT 8  --   ALKPHOS 98  --   BILITOT 0.6  --    ------------------------------------------------------------------------------------------------------------------ estimated creatinine clearance is 37.2 mL/min (A) (by C-G formula based on SCr of 1.6 mg/dL (H)). ------------------------------------------------------------------------------------------------------------------ No results for input(s): HGBA1C in the last 72 hours. ------------------------------------------------------------------------------------------------------------------ No results for input(s): CHOL, HDL, LDLCALC, TRIG, CHOLHDL, LDLDIRECT in the last 72 hours. ------------------------------------------------------------------------------------------------------------------ Recent Labs    08/26/17 1751  TSH 1.577    ------------------------------------------------------------------------------------------------------------------ No results for input(s): VITAMINB12, FOLATE, FERRITIN, TIBC, IRON, RETICCTPCT in the last 72 hours.  Coagulation profile No results for input(s): INR, PROTIME in the last 168 hours.  No results for input(s): DDIMER in the last 72 hours.  Cardiac Enzymes Recent Labs  Lab 08/26/17 1751  TROPONINI <0.03   ------------------------------------------------------------------------------------------------------------------ Invalid input(s): POCBNP    Assessment & Plan   This is a 62 year old female admitted for CHF exacerbation. 1.  CHF: Acute on chronic; systolic.  Change frequency to twice daily monitor renal function likely discharge home 2.  CAD: Stable; continue Imdur 3.  Diabetes mellitus type 2: Continue  basal insulin therapy as well as sliding scale insulin 4.  Hypertension: Controlled; continue carvedilol 5.  DVT prophylaxis: Lovenox 6.  GI prophylaxis: Pantoprazole per home regimen     Code Status Orders  (From admission, onward)         Start     Ordered   08/27/17 0348  Full code  Continuous     08/27/17 0347        Code Status History    Date Active Date Inactive Code Status Order ID Comments User Context   08/07/2017 1704 08/09/2017 2237 Full Code [id number]  Saundra Shelling, MD Inpatient   12/18/2016 1146 12/26/2016 2032 Full Code [id number]  Vaughan Basta, MD Inpatient   12/18/2016 0959 12/18/2016 1146 Full Code [id number]  Vaughan Basta, MD ED   12/04/2015 0509 12/07/2015 1756 Full Code [id number]  Holley Raring, NP ED   10/28/2015 1640 10/29/2015 1711 Full Code [id number]  Bettey Costa, MD Inpatient   08/29/2015 1438 08/30/2015 1750 Full Code [id number]  Hugelmeyer, Ubaldo Glassing, DO Inpatient           Consults cardiology  DVT Prophylaxis Lovenox  Lab Results  Component Value Date   PLT 169 08/26/2017     Time Spent in  minutes 35 minutes greater than 50% of time spent in care coordination and counseling patient regarding the condition and plan of care.   Dustin Flock M.D on 08/28/2017 at 3:06 PM  Between 7am to 6pm - Pager - [phone]  After 6pm go to [email or web address] - Proofreader  [employer]   Office  [phone]

## 2017-08-29 ENCOUNTER — Encounter: Payer: Self-pay | Admitting: Gastroenterology

## 2017-08-29 ENCOUNTER — Ambulatory Visit: Payer: Medicaid Other | Admitting: Gastroenterology

## 2017-08-29 LAB — MAGNESIUM: MAGNESIUM: 1.6 mg/dL — AB (ref 1.7–2.4)

## 2017-08-29 LAB — HEMOGLOBIN A1C
Hgb A1c MFr Bld: 6.3 % — ABNORMAL HIGH (ref 4.8–5.6)
Mean Plasma Glucose: 134 mg/dL

## 2017-08-29 LAB — BASIC METABOLIC PANEL
Anion gap: 7 (ref 5–15)
BUN: 37 mg/dL — AB (ref 8–23)
CO2: 30 mmol/L (ref 22–32)
CREATININE: 1.69 mg/dL — AB (ref 0.44–1.00)
Calcium: 7.8 mg/dL — ABNORMAL LOW (ref 8.9–10.3)
Chloride: 100 mmol/L (ref 98–111)
GFR calc Af Amer: 36 mL/min — ABNORMAL LOW (ref >60)
GFR calc non Af Amer: 31 mL/min — ABNORMAL LOW (ref >60)
GLUCOSE: 121 mg/dL — AB (ref 70–99)
Potassium: 3.6 mmol/L (ref 3.5–5.1)
SODIUM: 137 mmol/L (ref 135–145)

## 2017-08-29 LAB — GLUCOSE, CAPILLARY
Glucose-Capillary: 107 mg/dL — ABNORMAL HIGH (ref 70–99)
Glucose-Capillary: 130 mg/dL — ABNORMAL HIGH (ref 70–99)

## 2017-08-29 MED ORDER — CARVEDILOL 3.125 MG PO TABS: 3.1250 mg | ORAL_TABLET | Freq: Two times a day (BID) | ORAL | 0 refills | Status: DC

## 2017-08-29 MED ORDER — INSULIN GLARGINE 100 UNIT/ML SOLOSTAR PEN: 15.0000 [IU] | PEN_INJECTOR | Freq: Every day | SUBCUTANEOUS | 0 refills | Status: DC

## 2017-08-29 MED ORDER — ISOSORBIDE MONONITRATE ER 30 MG PO TB24
30.0000 mg | ORAL_TABLET | Freq: Every day | ORAL | 0 refills | Status: DC
Start: 2017-08-29 — End: 2017-11-03

## 2017-08-29 MED ORDER — ATORVASTATIN CALCIUM 40 MG PO TABS: 40.0000 mg | ORAL_TABLET | Freq: Every day | ORAL | 3 refills | Status: DC

## 2017-08-29 MED ORDER — FUROSEMIDE 40 MG PO TABS: 40.0000 mg | ORAL_TABLET | Freq: Two times a day (BID) | ORAL | 11 refills | Status: DC

## 2017-08-29 MED ORDER — ONDANSETRON HCL 4 MG PO TABS: 4.0000 mg | ORAL_TABLET | Freq: Four times a day (QID) | ORAL | 0 refills | Status: DC | PRN

## 2017-08-29 MED ORDER — ONDANSETRON HCL 4 MG/2ML IJ SOLN: 4.0000 mg | Freq: Four times a day (QID) | INTRAMUSCULAR | Status: DC | PRN

## 2017-08-29 NOTE — Progress Notes (Deleted)
She was admitted and seen by Dr Allen Norris on 08/09/17 for melena. 2 units PRBC transfused. . EGD showed gastritis. H/o gastric bypass. Plan at discharge was for an outpatient colonoscopy.   Hb around 10 grams 8 months back with MCV 79, On admission Hb around 6 grams.  The anemia was microcytic, no iron studies performed  For a hospital follow up for anemia. Labs suggest a microcytic anemia which would indicate a chronic blood loss rather than all acute.   Plan 1. CBC,iron studies, b12,folate,celiac serology, urine analysis.  2. Colonoscopy and if negative needs capsule study of the small bowel.  3. Stool H pylori antigen

## 2017-08-29 NOTE — Progress Notes (Signed)
Patient is being discharge home today as per order, patient is attempting to reach her family by phone for transport , I have called her husband and let him know about the discharge

## 2017-08-29 NOTE — Plan of Care (Signed)
  Problem: Education: Goal: Knowledge of General Education information will improve Description Including pain rating scale, medication(s)/side effects and non-pharmacologic comfort measures 08/29/2017 1504 by Basilio Cairo, RN Outcome: Adequate for Discharge 08/29/2017 1250 by Basilio Cairo, RN Outcome: Progressing   Problem: Clinical Measurements: Goal: Ability to maintain clinical measurements within normal limits will improve 08/29/2017 1504 by Basilio Cairo, RN Outcome: Adequate for Discharge 08/29/2017 1250 by Basilio Cairo, RN Outcome: Progressing   Problem: Clinical Measurements: Goal: Diagnostic test results will improve 08/29/2017 1504 by Basilio Cairo, RN Outcome: Adequate for Discharge 08/29/2017 1250 by Basilio Cairo, RN Outcome: Progressing

## 2017-08-29 NOTE — Clinical Social Work Note (Signed)
CSW was informed that patient stated she needed assistance getting a ride home.  Patient was able to get a friend who is able to pick her up now.  Patient inquired if Medicaid would pay for transportation, CSW informed her that she would have to register with Medicaid.  CSW provided patient contact number for [facility], [phone].  Patient expressed appreciation for information given to her.  CSW to sign off, please reconsult if social work needs arise.  Jones Broom. [place], MSW, [place]  08/29/2017 4:50 PM

## 2017-08-29 NOTE — Plan of Care (Signed)
  Problem: Clinical Measurements: Goal: Cardiovascular complication will be avoided Outcome: Progressing   Problem: Clinical Measurements: Goal: Ability to maintain clinical measurements within normal limits will improve Outcome: Progressing   Problem: Coping: Goal: Level of anxiety will decrease Outcome: Progressing

## 2017-08-29 NOTE — Plan of Care (Signed)
  Problem: Activity: Goal: Risk for activity intolerance will decrease Outcome: Progressing   Problem: Coping: Goal: Level of anxiety will decrease Outcome: Progressing   Problem: Pain Managment: Goal: General experience of comfort will improve Outcome: Progressing   Problem: Safety: Goal: Ability to remain free from injury will improve Outcome: Progressing   Problem: Skin Integrity: Goal: Risk for impaired skin integrity will decrease Outcome: Progressing   

## 2017-08-30 NOTE — Discharge Summary (Signed)
[facility] - [facility] [place], 62 y.o., DOB 25-Apr-1955, MRN [id number]. Admission date: 08/26/2017 Discharge Date 08/30/2017 Primary MD Angelene [facility] Admitting Physician Harrie Foreman, MD  Admission Diagnosis  Weakness [R53.1] Leg swelling [M79.89] Acute on chronic congestive heart failure, unspecified heart failure type [facility]) [I50.9]  Discharge Diagnosis   Active Problems: Acute on chronic systolic CHF Severe mitral regurg Coronary artery disease Type II the Essential hypertension  Hospital Course Patient is  62 year old admitted with acute on chronic systolic CHF.  Patient has not followed up with their cardiologist in the long time.  She states that she was diagnosed with CHF in [place].  Patient was treated with Lasix with significant improvement in her symptoms.  She was noted to have severe mitral regurg she needs outpatient cardiology follow-up for that.  Patient otherwise complained of some nausea but now better.  She is doing much better and stable for discharge.           Consults  cardiology  Significant Tests:  See full reports for all details     Dg Chest 2 View  Result Date: 08/26/2017 CLINICAL DATA:  Ongoing weakness for the past year, more so today with swelling of the feet for several months. EXAM: CHEST - 2 VIEW COMPARISON:  06/22/2017 FINDINGS: Stable cardiomegaly with aortic valvular replacement. Median sternotomy fixation hardware and post CABG clips are present. Streaky right infrahilar and left basilar bandlike opacities likely represent areas of atelectasis. Mild diffuse interstitial edema with small to moderate right effusion is noted, new since prior. No acute osseous abnormality. Degenerative changes are present along the dorsal spine. IMPRESSION: 1. Stable cardiomegaly with aortic atherosclerosis. 2. Interval development of interstitial edema/CHF with small right effusion and bibasilar  atelectasis. Electronically Signed   By: Ashley Royalty M.D.   On: 08/26/2017 18:35       Today   Subjective:   [name] patient doing better shortness of breath resolved Objective:   Blood pressure 123/78, pulse 92, temperature 98.2 F (36.8 C), temperature source Oral, resp. rate 18, height 5' 5"  (1.651 m), weight 75.9 kg, SpO2 95 %.  . No intake or output data in the 24 hours ending 08/30/17 1354  Exam VITAL SIGNS: Blood pressure 123/78, pulse 92, temperature 98.2 F (36.8 C), temperature source Oral, resp. rate 18, height 5' 5"  (1.651 m), weight 75.9 kg, SpO2 95 %.  GENERAL:  62 y.o.-year-old patient lying in the bed with no acute distress.  EYES: Pupils equal, round, reactive to light and accommodation. No scleral icterus. Extraocular muscles intact.  HEENT: Head atraumatic, normocephalic. Oropharynx and nasopharynx clear.  NECK:  Supple, no jugular venous distention. No thyroid enlargement, no tenderness.  LUNGS: Normal breath sounds bilaterally, no wheezing, rales,rhonchi or crepitation. No use of accessory muscles of respiration.  CARDIOVASCULAR: S1, S2 normal.  Positive diastolic murmurs, rubs, or gallops.  ABDOMEN: Soft, nontender, nondistended. Bowel sounds present. No organomegaly or mass.  EXTREMITIES: No pedal edema, cyanosis, or clubbing.  NEUROLOGIC: Cranial nerves II through XII are intact. Muscle strength 5/5 in all extremities. Sensation intact. Gait not checked.  PSYCHIATRIC: The patient is alert and oriented x 3.  SKIN: No obvious rash, lesion, or ulcer.   Data Review     CBC w Diff:  Lab Results  Component Value Date   WBC 7.6 08/26/2017   HGB 7.8 (L) 08/26/2017   HGB 15.0 08/03/2013   HCT 24.5 (L) 08/26/2017  HCT 45.7 08/03/2013   PLT 169 08/26/2017   PLT 118 (L) 08/03/2013   LYMPHOPCT 32 12/18/2016   LYMPHOPCT 30.9 08/03/2013   MONOPCT 5 12/18/2016   MONOPCT 6.7 08/03/2013   EOSPCT 2 12/18/2016   EOSPCT 3.0 08/03/2013   BASOPCT 1  12/18/2016   BASOPCT 0.7 08/03/2013   CMP:  Lab Results  Component Value Date   NA 137 08/29/2017   NA 139 08/03/2013   K 3.6 08/29/2017   K 3.4 (L) 08/03/2013   CL 100 08/29/2017   CL 104 08/03/2013   CO2 30 08/29/2017   CO2 27 08/03/2013   BUN 37 (H) 08/29/2017   BUN 16 08/03/2013   CREATININE 1.69 (H) 08/29/2017   CREATININE 1.19 08/03/2013   PROT 8.1 08/26/2017   PROT 9.3 (H) 03/12/2012   ALBUMIN 3.5 08/26/2017   ALBUMIN 3.7 03/12/2012   BILITOT 0.6 08/26/2017   BILITOT 0.5 03/12/2012   ALKPHOS 98 08/26/2017   ALKPHOS 199 (H) 03/12/2012   AST 15 08/26/2017   AST 40 (H) 03/12/2012   ALT 8 08/26/2017   ALT 34 03/12/2012  .  Micro Results No results found for this or any previous visit (from the past 240 hour(s)).   Code Status History    Date Active Date Inactive Code Status Order ID Comments User Context   08/27/2017 0347 08/29/2017 2054 Full Code [id number]  Harrie Foreman, MD Inpatient   08/07/2017 1704 08/09/2017 2237 Full Code [id number]  Saundra Shelling, MD Inpatient   12/18/2016 1146 12/26/2016 2032 Full Code [id number]  Vaughan Basta, MD Inpatient   12/18/2016 0959 12/18/2016 1146 Full Code [id number]  Vaughan Basta, MD ED   12/04/2015 0509 12/07/2015 1756 Full Code [id number]  Holley Raring, NP ED   10/28/2015 1640 10/29/2015 1711 Full Code [id number]  Bettey Costa, MD Inpatient   08/29/2015 1438 08/30/2015 1750 Full Code [id number]  Harvie Bridge, DO Inpatient          Follow-up Information    [facility] on 09/04/2017.   Specialty:  Cardiology Why:  at 9:40am Contact information: [address] [place], [facility] On 09/05/2017.   Specialty:  Family Medicine Why:  hosp f/u, Appointment Time: 10:20am Contact information: [place] [id number] [phone]         Yolonda Kida, MD In 2 weeks.   Specialties:  Cardiology, Internal Medicine Why:  chf, severe mitral regurag and chf : pt has to go to primary care before making appointment. PCP would need to to refer. Contact information: [place] [address] [phone]           Discharge Medications   Allergies as of 08/29/2017      Reactions   Latex Anaphylaxis, Rash   Gabapentin Nausea And Vomiting, Other (See Comments)      Medication List    TAKE these medications   atorvastatin 40 MG tablet Commonly known as:  LIPITOR Take 1 tablet (40 mg total) by mouth daily.   blood glucose meter kit and supplies Kit Dispense based on patient and insurance preference. Use up to four times daily as directed. (FOR ICD-9 250.00, 250.01).   carvedilol 3.125 MG tablet Commonly known as:  COREG Take 1 tablet (3.125 mg total) by mouth 2 (two) times daily with a meal.   furosemide 40 MG  tablet Commonly known as:  LASIX Take 1 tablet (40 mg total) by mouth 2 (two) times daily.   Insulin Glargine 100 UNIT/ML Solostar Pen Commonly known as:  LANTUS Inject 15 Units into the skin daily.   Insulin Pen Needle 32G X 5 MM Misc 1 Units by Does not apply route daily.   isosorbide mononitrate 30 MG 24 hr tablet Commonly known as:  IMDUR Take 1 tablet (30 mg total) by mouth daily.   ondansetron 4 MG tablet Commonly known as:  ZOFRAN Take 1 tablet (4 mg total) by mouth every 6 (six) hours as needed for nausea.   pantoprazole 40 MG tablet Commonly known as:  PROTONIX Take 1 tablet (40 mg total) by mouth 2 (two) times daily before a meal.   pregabalin 100 MG capsule Commonly known as:  LYRICA Take 1 capsule (100 mg total) by mouth 3 (three) times daily.          Total Time in preparing paper work, data evaluation and todays exam - 69 minutes  Dustin Flock M.D on 08/30/2017 at [facility]  [phone]

## 2017-09-04 ENCOUNTER — Telehealth: Payer: Self-pay | Admitting: Family

## 2017-09-04 ENCOUNTER — Ambulatory Visit: Payer: Medicaid Other | Admitting: Family

## 2017-09-04 NOTE — Telephone Encounter (Signed)
Patient did not show for her [facility] appointment on 09/04/17. Will attempt to reschedule.   This is the 5th appointment she has missed in the last 6 months.

## 2017-09-04 NOTE — Progress Notes (Deleted)
Patient ID: [name], female    DOB: [birth year], 62 y.o.   MRN: [id number]  HPI  Ms Kommer is a 62 y/o female with a history of COPD, diabetes, NSTEMI, renal insufficiency, tobacco use and chronic heart failure.   Echo report from 12/19/16 reviewed and showed an EF of 40-45% along with moderate AS, mild MS and moderate MR. EF has declined from previous reading in October 2017. Cardiac catheterization done 12/23/16 showed 3 vessel CAD with considerate of CABG when pulmonary and renal status is optimized. Prox RCA to mid RCA has 100% stenosis, Prox CX to mid CX has 80% stenosis and Ost LAD to prox LAD has 90% stenosis.   Admitted 08/26/17 due to acute on chronic HF. Initially needed IV lasix and then transitioned to oral medications. She was discharged after 3 days. Admitted 08/07/17 due to severe anemia with GIB. GI consult obtained. Received 2 units PRBC's. EGD noted gastritis. Discharged after 2 days. Was in the ED 06/22/17 due to weakness where she was treated and released.    She presents today for a follow-up visit with a chief complaint of     Past Medical History:  Diagnosis Date  . CHF (congestive heart failure) ([place])   . COPD (chronic obstructive pulmonary disease) ([place])   . Diabetes mellitus without complication ([place])   . NSTEMI (non-ST elevated myocardial infarction) [facility])    Past Surgical History:  Procedure Laterality Date  . APPENDECTOMY    . CARDIAC CATHETERIZATION    . CHOLECYSTECTOMY    . ESOPHAGOGASTRODUODENOSCOPY N/A 12/19/2016   Procedure: ESOPHAGOGASTRODUODENOSCOPY (EGD);  Surgeon: Lin Landsman, MD;  Location: [facility] ENDOSCOPY;  Service: Gastroenterology;  Laterality: N/A;  . ESOPHAGOGASTRODUODENOSCOPY (EGD) WITH PROPOFOL N/A 08/08/2017   Procedure: ESOPHAGOGASTRODUODENOSCOPY (EGD) WITH PROPOFOL;  Surgeon: [name], MD;  Location: [facility] ENDOSCOPY;  Service: Endoscopy;  Laterality: N/A;  . LEFT HEART CATH AND CORONARY ANGIOGRAPHY N/A 12/23/2016   Procedure:  LEFT HEART CATH AND CORONARY ANGIOGRAPHY;  Surgeon: Teodoro Spray, MD;  Location: [facility] CV LAB;  Service: Cardiovascular;  Laterality: N/A;   Family History  Problem Relation Age of Onset  . Hypertension Mother   . Diabetes Mellitus II Mother   . Heart disease Father    Social History   Tobacco Use  . Smoking status: Current Some Day Smoker    Packs/day: 0.25    Types: Cigarettes  . Smokeless tobacco: Never Used  Substance Use Topics  . Alcohol use: No   Allergies  Allergen Reactions  . Latex Anaphylaxis and Rash  . Gabapentin Nausea And Vomiting and Other (See Comments)     Review of Systems  Constitutional: Positive for appetite change (decreased) and fatigue.  HENT: Negative for congestion, postnasal drip and sore throat.   Eyes: Negative.   Respiratory: Negative for chest tightness and shortness of breath.   Cardiovascular: Negative for chest pain, palpitations and leg swelling.  Gastrointestinal: Negative for abdominal distention and abdominal pain.  Endocrine: Negative.   Genitourinary: Negative.   Musculoskeletal: Positive for back pain (chronic back pain). Negative for neck pain.  Skin: Negative.   Allergic/Immunologic: Negative.   Neurological: Positive for weakness and light-headedness (yesterday). Negative for dizziness.  Hematological: Negative for adenopathy. Does not bruise/bleed easily.  Psychiatric/Behavioral: Positive for sleep disturbance (wake often during the night). Negative for dysphoric mood. The patient is nervous/anxious.      Physical Exam  Constitutional: She is oriented to person, place, and time. She appears well-developed and well-nourished.  HENT:  Head: Normocephalic and atraumatic.  Neck: Normal range of motion. Neck supple. No JVD present.  Cardiovascular: Normal rate and regular rhythm.  Pulmonary/Chest: Effort normal. She has no wheezes. She has no rales.  Abdominal: Soft. She exhibits no distension. There is no  tenderness.  Musculoskeletal: She exhibits no edema or tenderness.  Neurological: She is alert and oriented to person, place, and time.  Skin: Skin is warm and dry.  Psychiatric: She has a normal mood and affect. Her behavior is normal. Thought content normal.  Nursing note and vitals reviewed.  Assessment & Plan:  1: Chronic heart failure with reduced ejection fraction- - NYHA class III - euvolemic today - not weighing daily as she doesn't have any scales. Set of scales was given to her and she was instructed to weigh every morning, write the weight down and call for an overnight weight gain of >2 pounds or a weekly weight gain of >5 pounds - adds "some" salt to foods and admits that they eat fast food and convenience type foods more often now. Discussed the importance of not adding any salt to her food and to read food labels so that she can closely follow a 2045m sodium diet. Written dietary information was given to her about this.  - unsure of her fluid intake. Discussed keeping her daily fluid intake to between 40-60 ounces of fluid daily. Instructed that this included anything that was liquid at room temperature such as jello/ice cream - saw cardiology ([facility]) 12/28/16 - BNP 08/26/17 was 1095.0  2: NSTEMI- - does not have cardiology f/u appointment scheduled and has questions regarding possible CABG - called Dr FBethanne Gingeroffice (who did the cath) and was [facility]. However, receptionist said she would speak with Dr. FUbaldo Glassing- in the meantime, an appointment was made with Dr. ESaunders Revelwho could see her today. [facility] an [facility] patient - spoke with Dr. ESaunders Revelwho said that he would call Dr. FUbaldo Glassingto ask what he would prefer to do so that patient can get scheduled for possible CABG  3: Diabetes- -  fasting glucose in clinic today was  -  - BMP from 08/29/17 reviewed and showed sodium 137, potassium 3.6 and GFR 36 - A1c from 08/26/17 reviewed and was 6.3%  4: Tobacco- - currently smoking 1-2 cigarettes daily - complete cessation discussed for 3 minutes with her.  Patient did not bring her medications nor a list. Each medication was verbally reviewed with the patient and she was encouraged to bring the bottles to every visit to confirm accuracy of list.

## 2017-09-26 ENCOUNTER — Encounter: Payer: Self-pay | Admitting: *Deleted

## 2017-09-26 ENCOUNTER — Emergency Department: Payer: Medicaid Other

## 2017-09-26 ENCOUNTER — Inpatient Hospital Stay
Admission: EM | Admit: 2017-09-26 | Discharge: 2017-09-30 | DRG: 378 | Disposition: A | Discharge status: Home / Self Care | Payer: Medicaid Other | Attending: Internal Medicine | Admitting: Internal Medicine

## 2017-09-26 ENCOUNTER — Other Ambulatory Visit: Payer: Self-pay

## 2017-09-26 DIAGNOSIS — Z8719 Personal history of other diseases of the digestive system: Secondary | ICD-10-CM

## 2017-09-26 DIAGNOSIS — Z888 Allergy status to other drugs, medicaments and biological substances status: Secondary | ICD-10-CM | POA: Diagnosis not present

## 2017-09-26 DIAGNOSIS — I252 Old myocardial infarction: Secondary | ICD-10-CM | POA: Diagnosis not present

## 2017-09-26 DIAGNOSIS — Z9049 Acquired absence of other specified parts of digestive tract: Secondary | ICD-10-CM | POA: Diagnosis not present

## 2017-09-26 DIAGNOSIS — D125 Benign neoplasm of sigmoid colon: Secondary | ICD-10-CM | POA: Diagnosis present

## 2017-09-26 DIAGNOSIS — J449 Chronic obstructive pulmonary disease, unspecified: Secondary | ICD-10-CM | POA: Diagnosis present

## 2017-09-26 DIAGNOSIS — Z91040 Latex allergy status: Secondary | ICD-10-CM

## 2017-09-26 DIAGNOSIS — I2510 Atherosclerotic heart disease of native coronary artery without angina pectoris: Secondary | ICD-10-CM | POA: Diagnosis present

## 2017-09-26 DIAGNOSIS — K922 Gastrointestinal hemorrhage, unspecified: Principal | ICD-10-CM | POA: Diagnosis present

## 2017-09-26 DIAGNOSIS — Z794 Long term (current) use of insulin: Secondary | ICD-10-CM | POA: Diagnosis not present

## 2017-09-26 DIAGNOSIS — Z8711 Personal history of peptic ulcer disease: Secondary | ICD-10-CM | POA: Diagnosis not present

## 2017-09-26 DIAGNOSIS — I5022 Chronic systolic (congestive) heart failure: Secondary | ICD-10-CM | POA: Diagnosis present

## 2017-09-26 DIAGNOSIS — D649 Anemia, unspecified: Secondary | ICD-10-CM | POA: Diagnosis present

## 2017-09-26 DIAGNOSIS — Z79899 Other long term (current) drug therapy: Secondary | ICD-10-CM

## 2017-09-26 DIAGNOSIS — E538 Deficiency of other specified B group vitamins: Secondary | ICD-10-CM | POA: Diagnosis present

## 2017-09-26 DIAGNOSIS — K3189 Other diseases of stomach and duodenum: Secondary | ICD-10-CM | POA: Diagnosis present

## 2017-09-26 DIAGNOSIS — F17210 Nicotine dependence, cigarettes, uncomplicated: Secondary | ICD-10-CM | POA: Diagnosis present

## 2017-09-26 DIAGNOSIS — E119 Type 2 diabetes mellitus without complications: Secondary | ICD-10-CM | POA: Diagnosis present

## 2017-09-26 DIAGNOSIS — Z951 Presence of aortocoronary bypass graft: Secondary | ICD-10-CM

## 2017-09-26 DIAGNOSIS — K921 Melena: Secondary | ICD-10-CM | POA: Diagnosis present

## 2017-09-26 DIAGNOSIS — R079 Chest pain, unspecified: Secondary | ICD-10-CM | POA: Diagnosis present

## 2017-09-26 DIAGNOSIS — D62 Acute posthemorrhagic anemia: Secondary | ICD-10-CM | POA: Diagnosis present

## 2017-09-26 DIAGNOSIS — K640 First degree hemorrhoids: Secondary | ICD-10-CM | POA: Diagnosis present

## 2017-09-26 DIAGNOSIS — Z955 Presence of coronary angioplasty implant and graft: Secondary | ICD-10-CM | POA: Diagnosis not present

## 2017-09-26 LAB — CBC
HCT: 16.9 % — ABNORMAL LOW (ref 35.0–47.0)
HEMOGLOBIN: 5.1 g/dL — AB (ref 12.0–16.0)
MCH: 20.7 pg — AB (ref 26.0–34.0)
MCHC: 30.3 g/dL — AB (ref 32.0–36.0)
MCV: 68.2 fL — ABNORMAL LOW (ref 80.0–100.0)
Platelets: 120 10*3/uL — ABNORMAL LOW (ref 150–440)
RBC: 2.48 MIL/uL — ABNORMAL LOW (ref 3.80–5.20)
RDW: 21 % — ABNORMAL HIGH (ref 11.5–14.5)
WBC: 6.6 10*3/uL (ref 3.6–11.0)

## 2017-09-26 LAB — BASIC METABOLIC PANEL
Anion gap: 6 (ref 5–15)
BUN: 17 mg/dL (ref 8–23)
CALCIUM: 8.5 mg/dL — AB (ref 8.9–10.3)
CO2: 22 mmol/L (ref 22–32)
Chloride: 108 mmol/L (ref 98–111)
Creatinine, Ser: 1.16 mg/dL — ABNORMAL HIGH (ref 0.44–1.00)
GFR calc Af Amer: 57 mL/min — ABNORMAL LOW (ref >60)
GFR, EST NON AFRICAN AMERICAN: 49 mL/min — AB (ref >60)
GLUCOSE: 161 mg/dL — AB (ref 70–99)
Potassium: 3.6 mmol/L (ref 3.5–5.1)
Sodium: 136 mmol/L (ref 135–145)

## 2017-09-26 LAB — TROPONIN I

## 2017-09-26 MED ORDER — ACETAMINOPHEN 325 MG PO TABS: 650.0000 mg | ORAL_TABLET | Freq: Four times a day (QID) | ORAL | Status: DC | PRN

## 2017-09-26 MED ORDER — ACETAMINOPHEN 650 MG RE SUPP: 650.0000 mg | Freq: Four times a day (QID) | RECTAL | Status: DC | PRN

## 2017-09-26 MED ORDER — INSULIN ASPART 100 UNIT/ML ~~LOC~~ SOLN
0.0000 [IU] | Freq: Four times a day (QID) | SUBCUTANEOUS | Status: DC
  Administered 2017-09-27: 3 [IU] via SUBCUTANEOUS
  Administered 2017-09-27 – 2017-09-29 (×4): 2 [IU] via SUBCUTANEOUS
  Filled 2017-09-26 (×5): qty 1

## 2017-09-26 MED ORDER — ONDANSETRON HCL 4 MG/2ML IJ SOLN: 4.0000 mg | Freq: Four times a day (QID) | INTRAMUSCULAR | Status: DC | PRN

## 2017-09-26 MED ORDER — SODIUM CHLORIDE 0.9 % IV SOLN
80.0000 mg | Freq: Once | INTRAVENOUS | Status: AC
  Administered 2017-09-27: 80 mg via INTRAVENOUS
  Filled 2017-09-26: qty 80

## 2017-09-26 MED ORDER — SODIUM CHLORIDE 0.9 % IV SOLN: 10.0000 mL/h | Freq: Once | INTRAVENOUS | Status: DC

## 2017-09-26 MED ORDER — PANTOPRAZOLE SODIUM 40 MG IV SOLR: 40.0000 mg | Freq: Two times a day (BID) | INTRAVENOUS | Status: DC

## 2017-09-26 MED ORDER — SODIUM CHLORIDE 0.9 % IV SOLN
8.0000 mg/h | INTRAVENOUS | Status: DC
  Administered 2017-09-27 – 2017-09-28 (×4): 8 mg/h via INTRAVENOUS
  Filled 2017-09-26: qty 80
  Filled 2017-09-26: qty 40
  Filled 2017-09-26 (×3): qty 80

## 2017-09-26 MED ORDER — ONDANSETRON HCL 4 MG PO TABS: 4.0000 mg | ORAL_TABLET | Freq: Four times a day (QID) | ORAL | Status: DC | PRN

## 2017-09-26 NOTE — ED Notes (Signed)
Unit #1 Emergency Release Blood ended at this time.  Vitals WDL.

## 2017-09-26 NOTE — ED Notes (Signed)
Unit #2 Emergency Release Blood administered at this time; blood started at 158m/hour.  Verified per ASeth Bake RN.

## 2017-09-26 NOTE — ED Provider Notes (Signed)
[facility] Emergency Department Provider Note  ____________________________________________  Time seen: Approximately 10:02 PM  I have reviewed the triage vital signs and the nursing notes.   HISTORY  Chief Complaint Chest Pain   HPI [name] is a 62 y.o. female with a history of upper GI bleed from gastritis, CHF, COPD, diabetes who presents for evaluation of chest pain.  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  Over the last 24 hours she reports feeling markedly worse, today was unable to walk due to severe weakness.  She is feeling lightheaded.  Today had several episodes of chest pain that she describes as tightness in the center of her chest associated with shortness of breath lasting a few minutes at a time.  No chest pain at this time.  She denies melena, hematemesis, coffee-ground emesis, hematochezia.  Reports her last bowel movement was a week ago.  Denies abdominal pain.  She is not on blood thinners.  Past Medical History:  Diagnosis Date  . CHF (congestive heart failure) ([place])   . COPD (chronic obstructive pulmonary disease) ([place])   . Diabetes mellitus without complication ([place])   . NSTEMI (non-ST elevated myocardial infarction) [facility])     Patient Active Problem List   Diagnosis Date Noted  . COPD (chronic obstructive pulmonary disease) ([place]) 09/26/2017  . Symptomatic anemia 09/26/2017  . Acute on chronic systolic CHF (congestive heart failure) ([place]) 08/27/2017  . Anemia   . Weakness   . GI bleed 08/07/2017  . Chronic systolic heart failure ([place]) 12/28/2016  . Diabetes ([place]) 12/28/2016  . Tobacco use 12/28/2016  . NSTEMI (non-ST elevated myocardial infarction) ([place])   . Sepsis ([place]) 12/18/2016  . CHF exacerbation ([place]) 12/04/2015  . PNA (pneumonia) 10/28/2015  . COPD exacerbation ([place]) 08/29/2015    Past Surgical History:  Procedure Laterality Date  . APPENDECTOMY    .  CARDIAC CATHETERIZATION    . CHOLECYSTECTOMY    . ESOPHAGOGASTRODUODENOSCOPY N/A 12/19/2016   Procedure: ESOPHAGOGASTRODUODENOSCOPY (EGD);  Surgeon: Lin Landsman, MD;  Location: [facility] ENDOSCOPY;  Service: Gastroenterology;  Laterality: N/A;  . ESOPHAGOGASTRODUODENOSCOPY (EGD) WITH PROPOFOL N/A 08/08/2017   Procedure: ESOPHAGOGASTRODUODENOSCOPY (EGD) WITH PROPOFOL;  Surgeon: [name], MD;  Location: [facility] ENDOSCOPY;  Service: Endoscopy;  Laterality: N/A;  . LEFT HEART CATH AND CORONARY ANGIOGRAPHY N/A 12/23/2016   Procedure: LEFT HEART CATH AND CORONARY ANGIOGRAPHY;  Surgeon: Teodoro Spray, MD;  Location: [facility] CV LAB;  Service: Cardiovascular;  Laterality: N/A;    Prior to Admission medications   Medication Sig Start Date End Date Taking? Authorizing Provider  atorvastatin (LIPITOR) 40 MG tablet Take 1 tablet (40 mg total) by mouth daily. 08/29/17 12/27/17  Dustin Flock, MD  blood glucose meter kit and supplies KIT Dispense based on patient and insurance preference. Use up to four times daily as directed. (FOR ICD-9 250.00, 250.01). 12/24/16   Hillary Bow, MD  carvedilol (COREG) 3.125 MG tablet Take 1 tablet (3.125 mg total) by mouth 2 (two) times daily with a meal. 08/29/17   Dustin Flock, MD  furosemide (LASIX) 40 MG tablet Take 1 tablet (40 mg total) by mouth 2 (two) times daily. 08/29/17 08/29/18  Dustin Flock, MD  Insulin Glargine (LANTUS SOLOSTAR) 100 UNIT/ML Solostar Pen Inject 15 Units into the skin daily. 08/29/17   Dustin Flock, MD  Insulin Pen Needle 32G X 5 MM MISC 1 Units by Does not apply route daily. 12/24/16  Hillary Bow, MD  isosorbide mononitrate (IMDUR) 30 MG 24 hr tablet Take 1 tablet (30 mg total) by mouth daily. 08/29/17   Dustin Flock, MD  ondansetron (ZOFRAN) 4 MG tablet Take 1 tablet (4 mg total) by mouth every 6 (six) hours as needed for nausea. 08/29/17   Dustin Flock, MD  pantoprazole (PROTONIX) 40 MG tablet Take 1 tablet (40 mg total)  by mouth 2 (two) times daily before a meal. 08/09/17   Salary, Avel Peace, MD  pregabalin (LYRICA) 100 MG capsule Take 1 capsule (100 mg total) by mouth 3 (three) times daily. 08/09/17 09/08/17  Salary, Avel Peace, MD    Allergies Latex and Gabapentin  Family History  Problem Relation Age of Onset  . Hypertension Mother   . Diabetes Mellitus II Mother   . Heart disease Father     Social History Social History   Tobacco Use  . Smoking status: Current Some Day Smoker    Packs/day: 0.25    Types: Cigarettes  . Smokeless tobacco: Never Used  Substance Use Topics  . Alcohol use: No  . Drug use: No    Review of Systems  Constitutional: Negative for fever. + Generalized weakness and dizziness Eyes: Negative for visual changes. ENT: Negative for sore throat. Neck: No neck pain  Cardiovascular: + chest pain. Respiratory: + shortness of breath. Gastrointestinal: Negative for abdominal pain, vomiting or diarrhea. + Constipation Genitourinary: Negative for dysuria. Musculoskeletal: Negative for back pain. Skin: Negative for rash. Neurological: Negative for headaches, weakness or numbness. Psych: No SI or HI  ____________________________________________   PHYSICAL EXAM:  VITAL SIGNS: ED Triage Vitals  Enc Vitals Group     BP 09/26/17 2040 129/72     Pulse Rate 09/26/17 2040 88     Resp 09/26/17 2040 20     Temp 09/26/17 2040 98.5 F (36.9 C)     Temp Source 09/26/17 2040 Oral     SpO2 09/26/17 2040 99 %     Weight 09/26/17 2041 162 lb (73.5 kg)     Height 09/26/17 2041 5' 5"  (1.651 m)     Head Circumference --      Peak Flow --      Pain Score 09/26/17 2040 7     Pain Loc --      Pain Edu? --      Excl. in [place]? --     Constitutional: Alert and oriented. Well appearing and in no apparent distress. HEENT:      Head: Normocephalic and atraumatic.         Eyes: Conjunctivae are normal. Sclera is non-icteric.       Mouth/Throat: Mucous membranes are moist.       Neck:  Supple with no signs of meningismus. Cardiovascular: Regular rate and rhythm. No murmurs, gallops, or rubs. 2+ symmetrical distal pulses are present in all extremities. No JVD. Respiratory: Normal respiratory effort. Lungs are clear to auscultation bilaterally. No wheezes, crackles, or rhonchi.  Gastrointestinal: Soft, non tender, and non distended with positive bowel sounds. No rebound or guarding. Genitourinary: No CVA tenderness.  Rectal exam showing melena grossly guaiac positive Musculoskeletal: Nontender with normal range of motion in all extremities. No edema, cyanosis, or erythema of extremities. Neurologic: Normal speech and language. Face is symmetric. Moving all extremities. No gross focal neurologic deficits are appreciated. Skin: Skin is warm, dry and intact. No rash noted. Psychiatric: Mood and affect are normal. Speech and behavior are normal.  ____________________________________________   LABS (all labs  ordered are listed, but only abnormal results are displayed)  Labs Reviewed  BASIC METABOLIC PANEL - Abnormal; Notable for the following components:      Result Value   Glucose, Bld 161 (*)    Creatinine, Ser 1.16 (*)    Calcium 8.5 (*)    GFR calc non Af Amer 49 (*)    GFR calc Af Amer 57 (*)    All other components within normal limits  CBC - Abnormal; Notable for the following components:   RBC 2.48 (*)    Hemoglobin 5.1 (*)    HCT 16.9 (*)    MCV 68.2 (*)    MCH 20.7 (*)    MCHC 30.3 (*)    RDW 21.0 (*)    Platelets 120 (*)    All other components within normal limits  TROPONIN I  PREPARE RBC (CROSSMATCH)   ____________________________________________  EKG  ED ECG REPORT I, Rudene Re, the attending physician, personally viewed and interpreted this ECG.  Normal sinus rhythm, rate of 85, right bundle branch block, borderline prolonged QTC, left axis deviation, no ST elevations or depressions.  Unchanged from prior from a month  ago. ____________________________________________  RADIOLOGY  I have personally reviewed the images performed during this visit and I agree with the Radiologist's read.   Interpretation by Radiologist:  Dg Chest 2 View  Result Date: 09/26/2017 CLINICAL DATA:  LEFT side chest pain, history CHF, COPD, diabetes mellitus, smoker, NSTEMI EXAM: CHEST - 2 VIEW COMPARISON:  08/26/2017 FINDINGS: 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 Mediastinal contours and pulmonary vascularity normal. Lungs clear. No acute infiltrate, pleural effusion or pneumothorax. Bones demineralized. 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 No acute abnormalities. Electronically Signed   By: Lavonia Dana M.D.   On: 09/26/2017 21:30     ____________________________________________   PROCEDURES  Procedure(s) performed: None Procedures Critical Care performed: yes  CRITICAL CARE Performed by: Rudene Re  ?  Total critical care time: 35 min  Critical care time was exclusive of separately billable procedures and treating other patients.  Critical care was necessary to treat or prevent imminent or life-threatening deterioration.  Critical care was time spent personally by me on the following activities: development of treatment plan with patient and/or surrogate as well as nursing, discussions with consultants, evaluation of patient's response to treatment, examination of patient, obtaining history from patient or surrogate, ordering and performing treatments and interventions, ordering and review of laboratory studies, ordering and review of radiographic studies, pulse oximetry and re-evaluation of patient's condition.  ____________________________________________   INITIAL IMPRESSION / ASSESSMENT AND PLAN / ED COURSE  62 y.o. female with a history of upper GI bleed from gastritis, CHF, COPD, diabetes who presents for evaluation of chest pain, generalized weakness and  lightheadedness.  Patient arrives hemodynamically stable, EKG showing right bundle branch block with no active ischemic changes.  Labs concerning for severe anemia with hemoglobin of 5.5.  Rectal exam showing melena grossly guaiac positive. Troponin is negative.  Patient was started on Protonix bolus and drip and given 2 units of emergent release blood.  Discussed with Dr. Jannifer Franklin for admission.      As part of my medical decision making, I reviewed the following data within the [place] notes reviewed and incorporated, Labs reviewed , EKG interpreted , Old EKG reviewed, Old chart reviewed, Radiograph reviewed , Discussed with admitting physician , Notes from prior ED visits and [place] Controlled Substance Database  Pertinent labs & imaging results that were available during my care of the patient were reviewed by me and considered in my medical decision making (see chart for details).    ____________________________________________   FINAL CLINICAL IMPRESSION(S) / ED DIAGNOSES  Final diagnoses:  Chest pain, unspecified type  UGIB (upper gastrointestinal bleed)  Acute on chronic blood loss anemia      NEW MEDICATIONS STARTED DURING THIS VISIT:  ED Discharge Orders    None       Note:  This document was prepared using Dragon voice recognition software and may include unintentional dictation errors.    Rudene Re, MD 09/26/17 2206

## 2017-09-26 NOTE — H&P (Signed)
[facility] [place] NAME: [name]    MR#:  [id number]  DATE OF BIRTH:  1955/11/27  DATE OF ADMISSION:  09/26/2017  PRIMARY CARE PHYSICIAN: Patient, No Pcp Per   REQUESTING/REFERRING PHYSICIAN: Alfred Levins, MD  CHIEF COMPLAINT:   Chief Complaint  Patient presents with  . Chest Pain    HISTORY OF PRESENT ILLNESS:  [name]  is a 62 y.o. female who presents with chief complaint as above.  She was recently admitted to the hospital for evaluation for GI bleed.  She states that she had EGD done at that time.  She did not have colonoscopy done.  She returns today for symptomatic anemia.  She states that she has been increasingly fatigued, easily winded.  Here she is found to have a hemoglobin of 5.  She states that a couple weeks ago when she came in she had frank melena, but that now she has not had a bowel movement for several days.  She was guaiac positive on rectal exam per ED physician, she was given 2 units of blood in the ED and hospitalist were called for admission  PAST MEDICAL HISTORY:   Past Medical History:  Diagnosis Date  . CHF (congestive heart failure) ([place])   . COPD (chronic obstructive pulmonary disease) ([place])   . Diabetes mellitus without complication ([place])   . NSTEMI (non-ST elevated myocardial infarction) ([place])      PAST SURGICAL HISTORY:   Past Surgical History:  Procedure Laterality Date  . APPENDECTOMY    . CARDIAC CATHETERIZATION    . CHOLECYSTECTOMY    . ESOPHAGOGASTRODUODENOSCOPY N/A 12/19/2016   Procedure: ESOPHAGOGASTRODUODENOSCOPY (EGD);  Surgeon: Lin Landsman, MD;  Location: [facility] ENDOSCOPY;  Service: Gastroenterology;  Laterality: N/A;  . ESOPHAGOGASTRODUODENOSCOPY (EGD) WITH PROPOFOL N/A 08/08/2017   Procedure: ESOPHAGOGASTRODUODENOSCOPY (EGD) WITH PROPOFOL;  Surgeon: [name], MD;  Location: [facility] ENDOSCOPY;  Service: Endoscopy;  Laterality: N/A;  . LEFT HEART CATH AND CORONARY  ANGIOGRAPHY N/A 12/23/2016   Procedure: LEFT HEART CATH AND CORONARY ANGIOGRAPHY;  Surgeon: Teodoro Spray, MD;  Location: [facility] CV LAB;  Service: Cardiovascular;  Laterality: N/A;     SOCIAL HISTORY:   Social History   Tobacco Use  . Smoking status: Current Some Day Smoker    Packs/day: 0.25    Types: Cigarettes  . Smokeless tobacco: Never Used  Substance Use Topics  . Alcohol use: No     FAMILY HISTORY:   Family History  Problem Relation Age of Onset  . Hypertension Mother   . Diabetes Mellitus II Mother   . Heart disease Father      DRUG ALLERGIES:   Allergies  Allergen Reactions  . Latex Anaphylaxis and Rash  . Gabapentin Nausea And Vomiting and Other (See Comments)    MEDICATIONS AT HOME:   Prior to Admission medications   Medication Sig Start Date End Date Taking? Authorizing Provider  atorvastatin (LIPITOR) 40 MG tablet Take 1 tablet (40 mg total) by mouth daily. 08/29/17 12/27/17  Dustin Flock, MD  blood glucose meter kit and supplies KIT Dispense based on patient and insurance preference. Use up to four times daily as directed. (FOR ICD-9 250.00, 250.01). 12/24/16   Hillary Bow, MD  carvedilol (COREG) 3.125 MG tablet Take 1 tablet (3.125 mg total) by mouth 2 (two) times daily with a meal. 08/29/17   Dustin Flock, MD  furosemide (LASIX) 40 MG tablet Take 1 tablet (40 mg total) by mouth  2 (two) times daily. 08/29/17 08/29/18  Dustin Flock, MD  Insulin Glargine (LANTUS SOLOSTAR) 100 UNIT/ML Solostar Pen Inject 15 Units into the skin daily. 08/29/17   Dustin Flock, MD  Insulin Pen Needle 32G X 5 MM MISC 1 Units by Does not apply route daily. 12/24/16   Hillary Bow, MD  isosorbide mononitrate (IMDUR) 30 MG 24 hr tablet Take 1 tablet (30 mg total) by mouth daily. 08/29/17   Dustin Flock, MD  ondansetron (ZOFRAN) 4 MG tablet Take 1 tablet (4 mg total) by mouth every 6 (six) hours as needed for nausea. 08/29/17   Dustin Flock, MD  pantoprazole  (PROTONIX) 40 MG tablet Take 1 tablet (40 mg total) by mouth 2 (two) times daily before a meal. 08/09/17   Salary, Avel Peace, MD  pregabalin (LYRICA) 100 MG capsule Take 1 capsule (100 mg total) by mouth 3 (three) times daily. 08/09/17 09/08/17  Salary, Avel Peace, MD    REVIEW OF SYSTEMS:  Review of Systems  Constitutional: Positive for malaise/fatigue. Negative for chills, fever and weight loss.  HENT: Negative for ear pain, hearing loss and tinnitus.   Eyes: Negative for blurred vision, double vision, pain and redness.  Respiratory: Positive for shortness of breath. Negative for cough and hemoptysis.   Cardiovascular: Negative for chest pain, palpitations, orthopnea and leg swelling.  Gastrointestinal: Positive for abdominal pain. Negative for constipation, diarrhea, nausea and vomiting.  Genitourinary: Negative for dysuria, frequency and hematuria.  Musculoskeletal: Negative for back pain, joint pain and neck pain.  Skin:       No acne, rash, or lesions  Neurological: Negative for dizziness, tremors, focal weakness and weakness.  Endo/Heme/Allergies: Negative for polydipsia. Does not bruise/bleed easily.  Psychiatric/Behavioral: Negative for depression. The patient is not nervous/anxious and does not have insomnia.      VITAL SIGNS:   Vitals:   09/26/17 2040 09/26/17 2041 09/26/17 2100 09/26/17 2145  BP: 129/72  (!) 150/70 (!) 152/72  Pulse: 88  91 90  Resp: 20  (!) 23 20  Temp: 98.5 F (36.9 C)   98.1 F (36.7 C)  TempSrc: Oral   Oral  SpO2: 99%  100% 100%  Weight:  73.5 kg    Height:  5' 5" (1.651 m)     Wt Readings from Last 3 Encounters:  09/26/17 73.5 kg  08/29/17 75.9 kg  08/07/17 72.6 kg    PHYSICAL EXAMINATION:  Physical Exam  Vitals reviewed. Constitutional: She is oriented to person, place, and time. She appears well-developed and well-nourished. No distress.  HENT:  Head: Normocephalic and atraumatic.  Mouth/Throat: Oropharynx is clear and moist.  Eyes:  Pupils are equal, round, and reactive to light. EOM are normal. No scleral icterus.  Pale conjunctiva  Neck: Normal range of motion. Neck supple. No JVD present. No thyromegaly present.  Cardiovascular: Normal rate, regular rhythm and intact distal pulses. Exam reveals no gallop and no friction rub.  No murmur heard. Respiratory: Effort normal and breath sounds normal. No respiratory distress. She has no wheezes. She has no rales.  GI: Soft. Bowel sounds are normal. She exhibits no distension. There is tenderness.  Musculoskeletal: Normal range of motion. She exhibits no edema.  No arthritis, no gout  Lymphadenopathy:    She has no cervical adenopathy.  Neurological: She is alert and oriented to person, place, and time. No cranial nerve deficit.  No dysarthria, no aphasia  Skin: Skin is warm and dry. No rash noted. No erythema.  Psychiatric: She has  a normal mood and affect. Her behavior is normal. Judgment and thought content normal.    LABORATORY PANEL:   CBC Recent Labs  Lab 09/26/17 2044  WBC 6.6  HGB 5.1*  HCT 16.9*  PLT 120*   ------------------------------------------------------------------------------------------------------------------  Chemistries  Recent Labs  Lab 09/26/17 2044  NA 136  K 3.6  CL 108  CO2 22  GLUCOSE 161*  BUN 17  CREATININE 1.16*  CALCIUM 8.5*   ------------------------------------------------------------------------------------------------------------------  Cardiac Enzymes Recent Labs  Lab 09/26/17 2044  TROPONINI <0.03   ------------------------------------------------------------------------------------------------------------------  RADIOLOGY:  Dg Chest 2 View  Result Date: 09/26/2017 CLINICAL DATA:  LEFT side chest pain, history CHF, COPD, diabetes mellitus, smoker, NSTEMI EXAM: CHEST - 2 VIEW COMPARISON:  08/26/2017 FINDINGS: 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 Mediastinal contours and pulmonary vascularity  normal. Lungs clear. No acute infiltrate, pleural effusion or pneumothorax. Bones demineralized. 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 No acute abnormalities. Electronically Signed   By: Lavonia Dana M.D.   On: 09/26/2017 21:30    EKG:   Orders placed or performed during the hospital encounter of 09/26/17  . ED EKG within 10 minutes  . ED EKG within 10 minutes  . EKG 12-Lead  . EKG 12-Lead    IMPRESSION AND PLAN:  Principal Problem:   GI bleed -Protonix drip, 2 units PRBC transfusion, n.p.o., GI consult Active Problems:   Symptomatic anemia -transfusion as above, other work-up as above   Chronic systolic heart failure (HCC) -patient is getting 2 units of PRBC transfusion tonight, she could likely benefit from further transfusion but will hold off on this until tomorrow so as not to fluid overload her, continue home meds   Diabetes ([place]) -sliding scale insulin with corresponding glucose checks   COPD (chronic obstructive pulmonary disease) (HCC) -home dose inhalers  Chart review performed and case discussed with ED provider. Labs, imaging and/or ECG reviewed by provider and discussed with patient/family. Management plans discussed with the patient and/or family.  DVT PROPHYLAXIS: Mechanical only  GI PROPHYLAXIS:  PPI   ADMISSION STATUS: Inpatient     CODE STATUS: Full Code Status History    Date Active Date Inactive Code Status Order ID Comments User Context   08/27/2017 0347 08/29/2017 2054 Full Code [id number]  Harrie Foreman, MD Inpatient   08/07/2017 1704 08/09/2017 2237 Full Code [id number]  Saundra Shelling, MD Inpatient   12/18/2016 1146 12/26/2016 2032 Full Code [id number]  Vaughan Basta, MD Inpatient   12/18/2016 0959 12/18/2016 1146 Full Code [id number]  Vaughan Basta, MD ED   12/04/2015 0509 12/07/2015 1756 Full Code [id number]  Holley Raring, NP ED   10/28/2015 1640 10/29/2015 1711 Full Code [id number]  Bettey Costa, MD Inpatient    08/29/2015 1438 08/30/2015 1750 Full Code [id number]  Hugelmeyer, Ubaldo Glassing, DO Inpatient      TOTAL TIME TAKING CARE OF THIS PATIENT: 45 minutes.   ,  [place] 09/26/2017, 10:07 PM  [employer]  [phone]  CC: Primary care physician; Patient, No Pcp Per  Note:  This document was prepared using Dragon voice recognition software and may include unintentional dictation errors.

## 2017-09-26 NOTE — ED Notes (Signed)
Hospitalist to bedside at this time 

## 2017-09-26 NOTE — ED Notes (Signed)
Patient transported to X-ray 

## 2017-09-26 NOTE — ED Notes (Addendum)
Unit #2 Emergency Release Blood ended at this time.  Vitals WDL.

## 2017-09-26 NOTE — ED Notes (Signed)
Unit #1 Emergency Release Blood administered at this time; blood started at 118m/hour.  Verified per Raquel, RN.

## 2017-09-26 NOTE — ED Notes (Signed)
Unit #1 rate change to 46m/hr to infuse over one hour per EDP verbal order.

## 2017-09-26 NOTE — ED Notes (Signed)
Decision to give 2 units Emergency Release blood per EDP, Alfred Levins.

## 2017-09-26 NOTE — ED Triage Notes (Addendum)
Pt brought in via ems from home with left side chest pain. 356m asa given by ems.  fsbs 266  Iv in place.  Pt alert.

## 2017-09-26 NOTE — ED Notes (Signed)
Unit #2 rate change to 462m/hour to infuse over one hour per EDP verbal order.

## 2017-09-27 LAB — CBC
HEMATOCRIT: 23.4 % — AB (ref 35.0–47.0)
Hemoglobin: 7.6 g/dL — ABNORMAL LOW (ref 12.0–16.0)
MCH: 23.9 pg — ABNORMAL LOW (ref 26.0–34.0)
MCHC: 32.7 g/dL (ref 32.0–36.0)
MCV: 73.2 fL — AB (ref 80.0–100.0)
Platelets: 112 10*3/uL — ABNORMAL LOW (ref 150–440)
RBC: 3.19 MIL/uL — ABNORMAL LOW (ref 3.80–5.20)
RDW: 22.2 % — AB (ref 11.5–14.5)
WBC: 7.8 10*3/uL (ref 3.6–11.0)

## 2017-09-27 LAB — BASIC METABOLIC PANEL
Anion gap: 6 (ref 5–15)
BUN: 15 mg/dL (ref 8–23)
CO2: 22 mmol/L (ref 22–32)
CREATININE: 0.95 mg/dL (ref 0.44–1.00)
Calcium: 8.4 mg/dL — ABNORMAL LOW (ref 8.9–10.3)
Chloride: 110 mmol/L (ref 98–111)
GFR calc Af Amer: >60 mL/min (ref >60)
GLUCOSE: 112 mg/dL — AB (ref 70–99)
POTASSIUM: 3.5 mmol/L (ref 3.5–5.1)
SODIUM: 138 mmol/L (ref 135–145)

## 2017-09-27 LAB — GLUCOSE, CAPILLARY
GLUCOSE-CAPILLARY: 107 mg/dL — AB (ref 70–99)
GLUCOSE-CAPILLARY: 213 mg/dL — AB (ref 70–99)
GLUCOSE-CAPILLARY: 87 mg/dL (ref 70–99)
Glucose-Capillary: 114 mg/dL — ABNORMAL HIGH (ref 70–99)
Glucose-Capillary: 163 mg/dL — ABNORMAL HIGH (ref 70–99)
Glucose-Capillary: 92 mg/dL (ref 70–99)

## 2017-09-27 LAB — PREPARE RBC (CROSSMATCH)

## 2017-09-27 LAB — BPAM RBC
Blood Product Expiration Date: 201910022359
Blood Product Expiration Date: 201910022359
ISSUE DATE / TIME: 201909102139
ISSUE DATE / TIME: 201909102139
UNIT TYPE AND RH: 5100
Unit Type and Rh: 5100

## 2017-09-27 LAB — TYPE AND SCREEN
ABO/RH(D): O POS
ANTIBODY SCREEN: NEGATIVE
Unit division: 0
Unit division: 0

## 2017-09-27 LAB — HEMOGLOBIN A1C
Hgb A1c MFr Bld: 6.2 % — ABNORMAL HIGH (ref 4.8–5.6)
Mean Plasma Glucose: 131.24 mg/dL

## 2017-09-27 LAB — MRSA PCR SCREENING: MRSA BY PCR: NEGATIVE

## 2017-09-27 LAB — HEMOGLOBIN
HEMOGLOBIN: 7.8 g/dL — AB (ref 12.0–16.0)
Hemoglobin: 7.9 g/dL — ABNORMAL LOW (ref 12.0–16.0)

## 2017-09-27 MED ORDER — POTASSIUM CHLORIDE IN NACL 20-0.9 MEQ/L-% IV SOLN
INTRAVENOUS | Status: DC
  Administered 2017-09-27 – 2017-09-30 (×4): via INTRAVENOUS
  Filled 2017-09-27 (×5): qty 1000

## 2017-09-27 MED ORDER — PEG 3350-KCL-NA BICARB-NACL 420 G PO SOLR
4000.0000 mL | Freq: Once | ORAL | Status: AC
  Administered 2017-09-27: 4000 mL via ORAL
  Filled 2017-09-27: qty 4000

## 2017-09-27 NOTE — H&P (View-Only) (Signed)
GI Inpatient Consult Note  Reason for Consult: GI Bleed   Attending Requesting Consult: Dr. Hillary Bow, MD  History of Present Illness: [name] is a 62 y.o. female seen for evaluation of symptomatic anemia s/t suspected GI bleed at the request of Dr. Darvin Neighbours, MD. Pt has PMH of hx of UGI bleed s/t gastritis, CHF, COPD, diabetes, and NSTEMI 12/2016. Pt underwent cardiac cath 12/2016 showing severe stenosis, it was recommended she have CABG but this has not been completed. She was admitted to hospital in July and underwent EGD while in the hospital by Dr. Allen Norris on 07/23 which showed gastritis. EGD while admitted in December performed 12/19/2016 showed several non-bleeding gastric ulcers with adherent blood clot. No colonoscopy was performed.  Pt presented to the ED yesterday for chest pain, shortness of breath, dizziness, and overall malaise. Hemoglobin in the ED was found to be 5.1 and she was found to have melena on DRE which was guaiac positive. She received 2 units of blood and hemoglobin today is 7.8. Pt seen and examined resting comfortably in hospital bed. She denies any chest pain at this time. She reports her shortness of breath and weakness have improved since receiving blood. She does endorse some chronic lower abd pain that feels the same as previous days. No bowel movements since admission. She reports normal for her is 1-2 BMs per week. She denies overt rectal bleeding. No frequent NSAID use. Appetite has been reduced over the past several months. She does state for the past several days she has noticed her stools are darker. It has been at least 10 years since her last colonoscopy - normal per pt. She denies coffee-found emesis, nausea, epigastric pain, diarrhea, rectal pain. She is prescribed Protonix 40 mg BID and has been taking this. She reports she is not currently on blood thinners.    Last Colonoscopy: >10 years ago Last Endoscopy: 08/08/2017   Past Medical History:   Past Medical History:  Diagnosis Date  . CHF (congestive heart failure) ([place])   . COPD (chronic obstructive pulmonary disease) ([place])   . Diabetes mellitus without complication ([place])   . NSTEMI (non-ST elevated myocardial infarction) [facility])     Problem List: Patient Active Problem List   Diagnosis Date Noted  . COPD (chronic obstructive pulmonary disease) ([place]) 09/26/2017  . Symptomatic anemia 09/26/2017  . Acute on chronic systolic CHF (congestive heart failure) ([place]) 08/27/2017  . Anemia   . Weakness   . GI bleed 08/07/2017  . Chronic systolic heart failure ([place]) 12/28/2016  . Diabetes ([place]) 12/28/2016  . Tobacco use 12/28/2016  . NSTEMI (non-ST elevated myocardial infarction) ([place])   . Sepsis ([place]) 12/18/2016  . CHF exacerbation ([place]) 12/04/2015  . PNA (pneumonia) 10/28/2015  . COPD exacerbation ([place]) 08/29/2015    Past Surgical History: Past Surgical History:  Procedure Laterality Date  . APPENDECTOMY    . CARDIAC CATHETERIZATION    . CHOLECYSTECTOMY    . ESOPHAGOGASTRODUODENOSCOPY N/A 12/19/2016   Procedure: ESOPHAGOGASTRODUODENOSCOPY (EGD);  Surgeon: Lin Landsman, MD;  Location: [facility] ENDOSCOPY;  Service: Gastroenterology;  Laterality: N/A;  . ESOPHAGOGASTRODUODENOSCOPY (EGD) WITH PROPOFOL N/A 08/08/2017   Procedure: ESOPHAGOGASTRODUODENOSCOPY (EGD) WITH PROPOFOL;  Surgeon: [name], MD;  Location: [facility] ENDOSCOPY;  Service: Endoscopy;  Laterality: N/A;  . LEFT HEART CATH AND CORONARY ANGIOGRAPHY N/A 12/23/2016   Procedure: LEFT HEART CATH AND CORONARY ANGIOGRAPHY;  Surgeon: Teodoro Spray, MD;  Location: [place] CV LAB;  Service: Cardiovascular;  Laterality: N/A;  Allergies: Allergies  Allergen Reactions  . Latex Anaphylaxis and Rash  . Gabapentin Nausea And Vomiting and Other (See Comments)    Home Medications: Medications Prior to Admission  Medication Sig Dispense Refill Last Dose  . atorvastatin (LIPITOR) 40 MG tablet Take 1 tablet (40 mg  total) by mouth daily. 30 tablet 3 Past Week at Unknown time  . carvedilol (COREG) 3.125 MG tablet Take 1 tablet (3.125 mg total) by mouth 2 (two) times daily with a meal. 60 tablet 0 Past Week at Unknown time  . furosemide (LASIX) 40 MG tablet Take 1 tablet (40 mg total) by mouth 2 (two) times daily. 60 tablet 11 Past Week at Unknown time  . Insulin Glargine (LANTUS SOLOSTAR) 100 UNIT/ML Solostar Pen Inject 15 Units into the skin daily. 300 mL 0 Past Week at Unknown time  . isosorbide mononitrate (IMDUR) 30 MG 24 hr tablet Take 1 tablet (30 mg total) by mouth daily. 30 tablet 0 Past Week at Unknown time  . ondansetron (ZOFRAN) 4 MG tablet Take 1 tablet (4 mg total) by mouth every 6 (six) hours as needed for nausea. 20 tablet 0 prn at prn  . pantoprazole (PROTONIX) 40 MG tablet Take 1 tablet (40 mg total) by mouth 2 (two) times daily before a meal. 60 tablet 0 Past Week at Unknown time  . pregabalin (LYRICA) 100 MG capsule Take 100 mg by mouth 3 (three) times daily.   Past Week at Unknown time  . blood glucose meter kit and supplies KIT Dispense based on patient and insurance preference. Use up to four times daily as directed. (FOR ICD-9 250.00, 250.01). 1 each 0 unknown at unknown  . Insulin Pen Needle 32G X 5 MM MISC 1 Units by Does not apply route daily. 100 each 0 08/25/2017 at Unknown time   Home medication reconciliation was completed with the patient.   Scheduled Inpatient Medications:   . insulin aspart  0-9 Units Subcutaneous Q6H  . [START ON 09/30/2017] pantoprazole  40 mg Intravenous Q12H    Continuous Inpatient Infusions:   . 0.9 % NaCl with KCl 20 mEq / L 50 mL/hr at 09/27/17 1111  . pantoprozole (PROTONIX) infusion 8 mg/hr (09/27/17 0808)    PRN Inpatient Medications:  acetaminophen **OR** acetaminophen, ondansetron **OR** ondansetron (ZOFRAN) IV  Family History: family history includes Diabetes Mellitus II in her mother; Heart disease in her father; Hypertension in her  mother.  The patient's family history is negative for inflammatory bowel disorders, GI malignancy, or solid organ transplantation.  Social History:   reports that she has been smoking cigarettes. She has been smoking about 0.25 packs per day. She has never used smokeless tobacco. She reports that she does not drink alcohol or use drugs. The patient denies ETOH, tobacco, or drug use.   Review of Systems: Constitutional: Weight is stable.  Eyes: No changes in vision. ENT: No oral lesions, sore throat.  GI: see HPI.  Heme/Lymph: No easy bruising.  CV: No chest pain.  GU: No hematuria.  Integumentary: No rashes.  Neuro: No headaches.  Psych: No depression/anxiety.  Endocrine: No heat/cold intolerance.  Allergic/Immunologic: No urticaria.  Resp: No cough, SOB.  Musculoskeletal: No joint swelling.    Physical Examination: BP (!) 154/95 (BP Location: Right Arm)   Pulse 91   Temp 98.6 F (37 C) (Oral)   Resp 20   Ht 5' 5"  (1.651 m)   Wt 73.5 kg   SpO2 100%   BMI 26.96 kg/m  Gen: NAD, alert and oriented x 4 HEENT: PEERLA, EOMI, Neck: supple, no JVD or thyromegaly Chest: CTA bilaterally, no wheezes, crackles, or other adventitious sounds CV: RRR, no m/g/c/r Abd: soft, nontender to palpation in all four quadrants, ND, +BS in all four quadrants; no HSM, guarding, ridigity, or rebound tenderness Ext: no edema, well perfused with 2+ pulses, Skin: no rash or lesions noted Lymph: no LAD  Data: Lab Results  Component Value Date   WBC 7.8 09/27/2017   HGB 7.8 (L) 09/27/2017   HCT 23.4 (L) 09/27/2017   MCV 73.2 (L) 09/27/2017   PLT 112 (L) 09/27/2017   Recent Labs  Lab 09/26/17 2044 09/27/17 0403 09/27/17 1030  HGB 5.1* 7.6* 7.8*   Lab Results  Component Value Date   NA 138 09/27/2017   K 3.5 09/27/2017   CL 110 09/27/2017   CO2 22 09/27/2017   BUN 15 09/27/2017   CREATININE 0.95 09/27/2017   Lab Results  Component Value Date   ALT 8 08/26/2017   AST 15 08/26/2017    ALKPHOS 98 08/26/2017   BILITOT 0.6 08/26/2017   No results for input(s): APTT, INR, PTT in the last 168 hours. Assessment/Plan:  62 y/o AA female with a PMH of Hx of UGI bleed s/t gastritis, diabetes, chronic systolic heart failure, COPD admitted for symptomatic anemia/GI bleed  1. Symptomatic anemia s/t #2 2. GI bleed - Symptoms are consistent with UGI bleed but cannot rule out lower GI bleed. DDx includes PUD +/- H pylori, gastritis, duodenitis, malignancy, colon polyp, colon cancer, diverticular - Continue PPI - Avoid all NSAIDs, blood thinners, anticoagulation - Continue to monitor H&H. Transfuse for Hg <7.0 - Recommend further evaluation with EGD and colonoscopy. Discussed procedure details and indications with pt. We discussed potential risks and complications, including bleeding, infection, small puncture to colon or esophagus, or problems with anesthesia. She consents to proceed - Plan for EGD/Colonoscopy to be performed by Dr. Alice Reichert tomorrow - Clear liquids today. NPO after 0500 tomorrow. Pt will complete bowel prep starting 1700 today    Thank you for the consult. Please call with questions or concerns.  Geanie Kenning, PA-C [place]

## 2017-09-27 NOTE — Consult Note (Signed)
GI Inpatient Consult Note  Reason for Consult: GI Bleed   Attending Requesting Consult: Dr. Hillary Bow, MD  History of Present Illness: [name] is a 62 y.o. female seen for evaluation of symptomatic anemia s/t suspected GI bleed at the request of Dr. Darvin Neighbours, MD. Pt has PMH of hx of UGI bleed s/t gastritis, CHF, COPD, diabetes, and NSTEMI 12/2016. Pt underwent cardiac cath 12/2016 showing severe stenosis, it was recommended she have CABG but this has not been completed. She was admitted to hospital in July and underwent EGD while in the hospital by Dr. Allen Norris on 07/23 which showed gastritis. EGD while admitted in December performed 12/19/2016 showed several non-bleeding gastric ulcers with adherent blood clot. No colonoscopy was performed.  Pt presented to the ED yesterday for chest pain, shortness of breath, dizziness, and overall malaise. Hemoglobin in the ED was found to be 5.1 and she was found to have melena on DRE which was guaiac positive. She received 2 units of blood and hemoglobin today is 7.8. Pt seen and examined resting comfortably in hospital bed. She denies any chest pain at this time. She reports her shortness of breath and weakness have improved since receiving blood. She does endorse some chronic lower abd pain that feels the same as previous days. No bowel movements since admission. She reports normal for her is 1-2 BMs per week. She denies overt rectal bleeding. No frequent NSAID use. Appetite has been reduced over the past several months. She does state for the past several days she has noticed her stools are darker. It has been at least 10 years since her last colonoscopy - normal per pt. She denies coffee-found emesis, nausea, epigastric pain, diarrhea, rectal pain. She is prescribed Protonix 40 mg BID and has been taking this. She reports she is not currently on blood thinners.    Last Colonoscopy: >10 years ago Last Endoscopy: 08/08/2017   Past Medical History:   Past Medical History:  Diagnosis Date  . CHF (congestive heart failure) ([place])   . COPD (chronic obstructive pulmonary disease) ([place])   . Diabetes mellitus without complication ([place])   . NSTEMI (non-ST elevated myocardial infarction) [facility])     Problem List: Patient Active Problem List   Diagnosis Date Noted  . COPD (chronic obstructive pulmonary disease) ([place]) 09/26/2017  . Symptomatic anemia 09/26/2017  . Acute on chronic systolic CHF (congestive heart failure) ([place]) 08/27/2017  . Anemia   . Weakness   . GI bleed 08/07/2017  . Chronic systolic heart failure ([place]) 12/28/2016  . Diabetes ([place]) 12/28/2016  . Tobacco use 12/28/2016  . NSTEMI (non-ST elevated myocardial infarction) ([place])   . Sepsis ([place]) 12/18/2016  . CHF exacerbation ([place]) 12/04/2015  . PNA (pneumonia) 10/28/2015  . COPD exacerbation ([place]) 08/29/2015    Past Surgical History: Past Surgical History:  Procedure Laterality Date  . APPENDECTOMY    . CARDIAC CATHETERIZATION    . CHOLECYSTECTOMY    . ESOPHAGOGASTRODUODENOSCOPY N/A 12/19/2016   Procedure: ESOPHAGOGASTRODUODENOSCOPY (EGD);  Surgeon: Lin Landsman, MD;  Location: [facility] ENDOSCOPY;  Service: Gastroenterology;  Laterality: N/A;  . ESOPHAGOGASTRODUODENOSCOPY (EGD) WITH PROPOFOL N/A 08/08/2017   Procedure: ESOPHAGOGASTRODUODENOSCOPY (EGD) WITH PROPOFOL;  Surgeon: [name], MD;  Location: [facility] ENDOSCOPY;  Service: Endoscopy;  Laterality: N/A;  . LEFT HEART CATH AND CORONARY ANGIOGRAPHY N/A 12/23/2016   Procedure: LEFT HEART CATH AND CORONARY ANGIOGRAPHY;  Surgeon: Teodoro Spray, MD;  Location: [place] CV LAB;  Service: Cardiovascular;  Laterality: N/A;  Allergies: Allergies  Allergen Reactions  . Latex Anaphylaxis and Rash  . Gabapentin Nausea And Vomiting and Other (See Comments)    Home Medications: Medications Prior to Admission  Medication Sig Dispense Refill Last Dose  . atorvastatin (LIPITOR) 40 MG tablet Take 1 tablet (40 mg  total) by mouth daily. 30 tablet 3 Past Week at Unknown time  . carvedilol (COREG) 3.125 MG tablet Take 1 tablet (3.125 mg total) by mouth 2 (two) times daily with a meal. 60 tablet 0 Past Week at Unknown time  . furosemide (LASIX) 40 MG tablet Take 1 tablet (40 mg total) by mouth 2 (two) times daily. 60 tablet 11 Past Week at Unknown time  . Insulin Glargine (LANTUS SOLOSTAR) 100 UNIT/ML Solostar Pen Inject 15 Units into the skin daily. 300 mL 0 Past Week at Unknown time  . isosorbide mononitrate (IMDUR) 30 MG 24 hr tablet Take 1 tablet (30 mg total) by mouth daily. 30 tablet 0 Past Week at Unknown time  . ondansetron (ZOFRAN) 4 MG tablet Take 1 tablet (4 mg total) by mouth every 6 (six) hours as needed for nausea. 20 tablet 0 prn at prn  . pantoprazole (PROTONIX) 40 MG tablet Take 1 tablet (40 mg total) by mouth 2 (two) times daily before a meal. 60 tablet 0 Past Week at Unknown time  . pregabalin (LYRICA) 100 MG capsule Take 100 mg by mouth 3 (three) times daily.   Past Week at Unknown time  . blood glucose meter kit and supplies KIT Dispense based on patient and insurance preference. Use up to four times daily as directed. (FOR ICD-9 250.00, 250.01). 1 each 0 unknown at unknown  . Insulin Pen Needle 32G X 5 MM MISC 1 Units by Does not apply route daily. 100 each 0 08/25/2017 at Unknown time   Home medication reconciliation was completed with the patient.   Scheduled Inpatient Medications:   . insulin aspart  0-9 Units Subcutaneous Q6H  . [START ON 09/30/2017] pantoprazole  40 mg Intravenous Q12H    Continuous Inpatient Infusions:   . 0.9 % NaCl with KCl 20 mEq / L 50 mL/hr at 09/27/17 1111  . pantoprozole (PROTONIX) infusion 8 mg/hr (09/27/17 0808)    PRN Inpatient Medications:  acetaminophen **OR** acetaminophen, ondansetron **OR** ondansetron (ZOFRAN) IV  Family History: family history includes Diabetes Mellitus II in her mother; Heart disease in her father; Hypertension in her  mother.  The patient's family history is negative for inflammatory bowel disorders, GI malignancy, or solid organ transplantation.  Social History:   reports that she has been smoking cigarettes. She has been smoking about 0.25 packs per day. She has never used smokeless tobacco. She reports that she does not drink alcohol or use drugs. The patient denies ETOH, tobacco, or drug use.   Review of Systems: Constitutional: Weight is stable.  Eyes: No changes in vision. ENT: No oral lesions, sore throat.  GI: see HPI.  Heme/Lymph: No easy bruising.  CV: No chest pain.  GU: No hematuria.  Integumentary: No rashes.  Neuro: No headaches.  Psych: No depression/anxiety.  Endocrine: No heat/cold intolerance.  Allergic/Immunologic: No urticaria.  Resp: No cough, SOB.  Musculoskeletal: No joint swelling.    Physical Examination: BP (!) 154/95 (BP Location: Right Arm)   Pulse 91   Temp 98.6 F (37 C) (Oral)   Resp 20   Ht 5' 5"  (1.651 m)   Wt 73.5 kg   SpO2 100%   BMI 26.96 kg/m  Gen: NAD, alert and oriented x 4 HEENT: PEERLA, EOMI, Neck: supple, no JVD or thyromegaly Chest: CTA bilaterally, no wheezes, crackles, or other adventitious sounds CV: RRR, no m/g/c/r Abd: soft, nontender to palpation in all four quadrants, ND, +BS in all four quadrants; no HSM, guarding, ridigity, or rebound tenderness Ext: no edema, well perfused with 2+ pulses, Skin: no rash or lesions noted Lymph: no LAD  Data: Lab Results  Component Value Date   WBC 7.8 09/27/2017   HGB 7.8 (L) 09/27/2017   HCT 23.4 (L) 09/27/2017   MCV 73.2 (L) 09/27/2017   PLT 112 (L) 09/27/2017   Recent Labs  Lab 09/26/17 2044 09/27/17 0403 09/27/17 1030  HGB 5.1* 7.6* 7.8*   Lab Results  Component Value Date   NA 138 09/27/2017   K 3.5 09/27/2017   CL 110 09/27/2017   CO2 22 09/27/2017   BUN 15 09/27/2017   CREATININE 0.95 09/27/2017   Lab Results  Component Value Date   ALT 8 08/26/2017   AST 15 08/26/2017    ALKPHOS 98 08/26/2017   BILITOT 0.6 08/26/2017   No results for input(s): APTT, INR, PTT in the last 168 hours. Assessment/Plan:  62 y/o AA female with a PMH of Hx of UGI bleed s/t gastritis, diabetes, chronic systolic heart failure, COPD admitted for symptomatic anemia/GI bleed  1. Symptomatic anemia s/t #2 2. GI bleed - Symptoms are consistent with UGI bleed but cannot rule out lower GI bleed. DDx includes PUD +/- H pylori, gastritis, duodenitis, malignancy, colon polyp, colon cancer, diverticular - Continue PPI - Avoid all NSAIDs, blood thinners, anticoagulation - Continue to monitor H&H. Transfuse for Hg <7.0 - Recommend further evaluation with EGD and colonoscopy. Discussed procedure details and indications with pt. We discussed potential risks and complications, including bleeding, infection, small puncture to colon or esophagus, or problems with anesthesia. She consents to proceed - Plan for EGD/Colonoscopy to be performed by Dr. Alice Reichert tomorrow - Clear liquids today. NPO after 0500 tomorrow. Pt will complete bowel prep starting 1700 today    Thank you for the consult. Please call with questions or concerns.  Geanie Kenning, PA-C [place]

## 2017-09-27 NOTE — Progress Notes (Signed)
Advance care planning  Purpose of Encounter Discussed regarding anemia, GI bleed and CODE STATUS  Parties in Attendance Patient  Patients Decisional capacity Patient alert and oriented.  Able to make medical decisions.  Discussed with patient regarding her anemia, GI bleed and need for endoscopy procedures.  She may need further blood transfusions. Patient does not have a documented healthcare power of attorney but wants her husband to make decisions if she cannot at any point.  Encouraged her to have documentation in place. Discussed CODE STATUS and would like to be a full code.   FULL CODE  Time spent - 18 minutes

## 2017-09-27 NOTE — Progress Notes (Signed)
[place] [facility] [place] NAME: [name]    MR#:  [id number]  DATE OF BIRTH:  July 07, 1955  SUBJECTIVE:  CHIEF COMPLAINT:   Chief Complaint  Patient presents with  . Chest Pain   Feels less fatigued after blood transfusions. No shortness of breath.  Mild chronic abdominal discomfort is the same.  REVIEW OF SYSTEMS:    Review of Systems  Constitutional: Positive for malaise/fatigue. Negative for chills and fever.  HENT: Negative for sore throat.   Eyes: Negative for blurred vision, double vision and pain.  Respiratory: Negative for cough, hemoptysis, shortness of breath and wheezing.   Cardiovascular: Negative for chest pain, palpitations, orthopnea and leg swelling.  Gastrointestinal: Negative for abdominal pain, constipation, diarrhea, heartburn, nausea and vomiting.  Genitourinary: Negative for dysuria and hematuria.  Musculoskeletal: Negative for back pain and joint pain.  Skin: Negative for rash.  Neurological: Negative for sensory change, speech change, focal weakness and headaches.  Endo/Heme/Allergies: Does not bruise/bleed easily.  Psychiatric/Behavioral: Negative for depression. The patient is not nervous/anxious.     DRUG ALLERGIES:   Allergies  Allergen Reactions  . Latex Anaphylaxis and Rash  . Gabapentin Nausea And Vomiting and Other (See Comments)    VITALS:  Blood pressure (!) 154/95, pulse 91, temperature 98.6 F (37 C), temperature source Oral, resp. rate 20, height 5' 5"  (1.651 m), weight 73.5 kg, SpO2 100 %.  PHYSICAL EXAMINATION:   Physical Exam  GENERAL:  62 y.o.-year-old patient lying in the bed with no acute distress.  EYES: Pupils equal, round, reactive to light and accommodation. No scleral icterus. Extraocular muscles intact.  HEENT: Head atraumatic, normocephalic. Oropharynx and nasopharynx clear.  NECK:  Supple, no jugular venous distention. No thyroid enlargement, no tenderness.  LUNGS: Normal  breath sounds bilaterally, no wheezing, rales, rhonchi. No use of accessory muscles of respiration.  CARDIOVASCULAR: S1, S2 normal. No murmurs, rubs, or gallops.  ABDOMEN: Soft, nontender, nondistended. Bowel sounds present. No organomegaly or mass.  EXTREMITIES: No cyanosis, clubbing or edema b/l.    NEUROLOGIC: Cranial nerves II through XII are intact. No focal Motor or sensory deficits b/l.   PSYCHIATRIC: The patient is alert and oriented x 3.  SKIN: No obvious rash, lesion, or ulcer.   LABORATORY PANEL:   CBC Recent Labs  Lab 09/27/17 0403 09/27/17 1030  WBC 7.8  --   HGB 7.6* 7.8*  HCT 23.4*  --   PLT 112*  --    ------------------------------------------------------------------------------------------------------------------ Chemistries  Recent Labs  Lab 09/27/17 0403  NA 138  K 3.5  CL 110  CO2 22  GLUCOSE 112*  BUN 15  CREATININE 0.95  CALCIUM 8.4*   ------------------------------------------------------------------------------------------------------------------  Cardiac Enzymes Recent Labs  Lab 09/26/17 2044  TROPONINI <0.03   ------------------------------------------------------------------------------------------------------------------  RADIOLOGY:  Dg Chest 2 View  Result Date: 09/26/2017 CLINICAL DATA:  LEFT side chest pain, history CHF, COPD, diabetes mellitus, smoker, NSTEMI EXAM: CHEST - 2 VIEW COMPARISON:  08/26/2017 FINDINGS: 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 Mediastinal contours and pulmonary vascularity normal. Lungs clear. No acute infiltrate, pleural effusion or pneumothorax. Bones demineralized. 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 No acute abnormalities. Electronically Signed   By: Lavonia Dana M.D.   On: 09/26/2017 21:30     ASSESSMENT AND PLAN:   * GI bleed Etiology unclear. EGD showed gastric irritation. Nothing acute.  * Acute blood loss anemia over chronic anemia Transfused 2 units  PRBC Hb at  7.8.  * DM SSI  *  Chronic systolic heart failure  Monitor for fluid overload.  *   COPD (chronic obstructive pulmonary disease) (HCC) -home dose inhalers.  All the records are reviewed and case discussed with Care Management/Social Worker Management plans discussed with the patient, family and they are in agreement.  CODE STATUS: FULL CODE  DVT Prophylaxis: SCDs  TOTAL TIME TAKING CARE OF THIS PATIENT: 30 minutes.   POSSIBLE D/C IN 1-2 DAYS, DEPENDING ON CLINICAL CONDITION.  Neita Carp M.D on 09/27/2017 at 11:21 AM  Between 7am to 6pm - Pager - [phone]  After 6pm go to [email or web address] - password [email or web address] [facility]  Office  [phone]  CC: Primary care physician; Patient, No Pcp Per  Note: This dictation was prepared with Dragon dictation along with smaller phrase technology. Any transcriptional errors that result from this process are unintentional.

## 2017-09-28 ENCOUNTER — Encounter
Admission: EM | Disposition: A | Discharge status: Home / Self Care | Payer: Self-pay | Source: Home / Self Care | Attending: Internal Medicine

## 2017-09-28 ENCOUNTER — Inpatient Hospital Stay: Payer: Medicaid Other | Admitting: Certified Registered Nurse Anesthetist

## 2017-09-28 ENCOUNTER — Encounter: Payer: Self-pay | Admitting: *Deleted

## 2017-09-28 HISTORY — PX: COLONOSCOPY: SHX5424

## 2017-09-28 HISTORY — PX: ESOPHAGOGASTRODUODENOSCOPY: SHX5428

## 2017-09-28 LAB — GLUCOSE, CAPILLARY
GLUCOSE-CAPILLARY: 154 mg/dL — AB (ref 70–99)
Glucose-Capillary: 106 mg/dL — ABNORMAL HIGH (ref 70–99)
Glucose-Capillary: 88 mg/dL (ref 70–99)
Glucose-Capillary: 101 mg/dL — ABNORMAL HIGH (ref 70–99)

## 2017-09-28 LAB — HEMOGLOBIN: Hemoglobin: 7.4 g/dL — ABNORMAL LOW (ref 12.0–16.0)

## 2017-09-28 LAB — HEMOGLOBIN AND HEMATOCRIT, BLOOD
HCT: 26.7 % — ABNORMAL LOW (ref 35.0–47.0)
Hemoglobin: 8.6 g/dL — ABNORMAL LOW (ref 12.0–16.0)

## 2017-09-28 SURGERY — EGD (ESOPHAGOGASTRODUODENOSCOPY)
Anesthesia: General

## 2017-09-28 MED ORDER — PROPOFOL 500 MG/50ML IV EMUL
INTRAVENOUS | Status: DC | PRN
  Administered 2017-09-28: 100 ug/kg/min via INTRAVENOUS

## 2017-09-28 MED ORDER — MIDAZOLAM HCL 2 MG/2ML IJ SOLN
INTRAMUSCULAR | Status: AC
  Filled 2017-09-28: qty 2

## 2017-09-28 MED ORDER — SODIUM CHLORIDE 0.9 % IV SOLN
INTRAVENOUS | Status: DC | PRN
  Administered 2017-09-28: 12:00:00 via INTRAVENOUS

## 2017-09-28 MED ORDER — PROPOFOL 10 MG/ML IV BOLUS
INTRAVENOUS | Status: DC | PRN
  Administered 2017-09-28: 40 mg via INTRAVENOUS

## 2017-09-28 MED ORDER — LIDOCAINE HCL (CARDIAC) PF 100 MG/5ML IV SOSY
PREFILLED_SYRINGE | INTRAVENOUS | Status: DC | PRN
  Administered 2017-09-28: 100 mg via INTRAVENOUS

## 2017-09-28 MED ORDER — MIDAZOLAM HCL 2 MG/2ML IJ SOLN
INTRAMUSCULAR | Status: DC | PRN
  Administered 2017-09-28: 2 mg via INTRAVENOUS

## 2017-09-28 NOTE — Progress Notes (Signed)
Spoke to Dr. Alice Reichert. Updates given. Ordered clear liquid diet.

## 2017-09-28 NOTE — Op Note (Signed)
[facility] Gastroenterology Patient Name: [name] Procedure Date: 09/28/2017 1:31 PM MRN: [id number] Account #: [id number] Date of Birth: [birth year] Admit Type: Inpatient Age: 62 Room: [facility] ENDO ROOM 1 Gender: Female Note Status: Finalized Procedure:            Colonoscopy Indications:          Evaluation of unexplained GI bleeding, Acute post                        hemorrhagic anemia Providers:            Benay Pike. Alice Reichert MD, MD Referring MD:         No Local Md, MD (Referring MD) Medicines:            Propofol per Anesthesia Complications:        No immediate complications. Procedure:            Pre-Anesthesia Assessment:                       - The risks and benefits of the procedure and the                        sedation options and risks were discussed with the                        patient. All questions were answered and informed                        consent was obtained.                       - Patient identification and proposed procedure were                        verified prior to the procedure by the nurse. The                        procedure was verified in the procedure room.                       - ASA Grade Assessment: III - A patient with severe                        systemic disease.                       - After reviewing the risks and benefits, the patient                        was deemed in satisfactory condition to undergo the                        procedure.                       After obtaining informed consent, the colonoscope was                        passed under direct vision. Throughout the procedure,  the patient's blood pressure, pulse, and oxygen                        saturations were monitored continuously. The                        Colonoscope was introduced through the anus and                        advanced to the the cecum, identified by appendiceal                        orifice  and ileocecal valve. The colonoscopy was                        performed without difficulty. The patient tolerated the                        procedure well. The quality of the bowel preparation                        was good. The ileocecal valve, appendiceal orifice, and                        rectum were photographed. Findings:      The perianal and digital rectal examinations were normal.      The perianal exam findings include internal hemorrhoids that prolapse       with straining, but spontaneously regress to the resting position (Grade       II).      The perianal and digital rectal examinations were normal. Pertinent       negatives include normal sphincter tone.      A 5 mm polyp was found in the sigmoid colon. The polyp was sessile. The       polyp was removed with a jumbo cold forceps. Resection and retrieval       were complete.      Non-bleeding internal hemorrhoids were found during retroflexion. The       hemorrhoids were Grade I (internal hemorrhoids that do not prolapse).      There is no endoscopic evidence of bleeding, diverticula, erythema,       inflammation or mass in the entire colon. Impression:           - Internal hemorrhoids that prolapse with straining,                        but spontaneously regress to the resting position                        (Grade II) found on perianal exam.                       - One 5 mm polyp in the sigmoid colon, removed with a                        jumbo cold forceps. Resected and retrieved.                       - Non-bleeding internal hemorrhoids. Recommendation:       - Return patient to hospital  ward for possible                        discharge same day.                       - Will consider small bowel wireless capsule endoscopy                        based upon review of pathology results.                       - Advance diet as tolerated. Procedure Code(s):    --- Professional ---                       [phone],  Colonoscopy, flexible; with biopsy, single or                        multiple Diagnosis Code(s):    --- Professional ---                       D62, Acute posthemorrhagic anemia                       K92.2, Gastrointestinal hemorrhage, unspecified                       K64.1, Second degree hemorrhoids                       D12.5, Benign neoplasm of sigmoid colon CPT copyright 2017 American Medical Association. All rights reserved. The codes documented in this report are preliminary and upon coder review may  be revised to meet current compliance requirements. Efrain Sella MD, MD 09/28/2017 2:17:10 PM This report has been signed electronically. Number of Addenda: 0 Note Initiated On: 09/28/2017 1:31 PM Scope Withdrawal Time: 0 hours 6 minutes 3 seconds  Total Procedure Duration: 0 hours 10 minutes 3 seconds       [facility]

## 2017-09-28 NOTE — Progress Notes (Signed)
Received from endo. Assumed care of patient. Report previously from [employer]. Refer to charting for focused assessment. Regular diet order placed per verbal order from MD.

## 2017-09-28 NOTE — Anesthesia Procedure Notes (Signed)
Performed by: Demetrius Charity, CRNA Pre-anesthesia Checklist: Patient identified, Emergency Drugs available, Suction available, Patient being monitored and Timeout performed Patient Re-evaluated:Patient Re-evaluated prior to induction Oxygen Delivery Method: Nasal cannula Induction Type: IV induction

## 2017-09-28 NOTE — Progress Notes (Signed)
Patient in for discharge however had some bleeding.  Nurse called me about diet  Looks like hemoglobin so far has been stable and therefore we will continue regular diet for the night.  She tried to page GI but she did not get a response.

## 2017-09-28 NOTE — Anesthesia Post-op Follow-up Note (Signed)
Anesthesia QCDR form completed.        

## 2017-09-28 NOTE — Discharge Instructions (Signed)
Resume diet and activity as before  You have to follow up with Dr. Alice Reichert for capsule endoscopy.

## 2017-09-28 NOTE — Progress Notes (Signed)
Paged Dr. Alice Reichert for updates on pt/questions about drips and diet. Awaiting response.

## 2017-09-28 NOTE — Progress Notes (Signed)
Spoke to Dr. Darvin Neighbours. To DC pt and DC protonix order.

## 2017-09-28 NOTE — Op Note (Signed)
[facility] Gastroenterology Patient Name: [name] Procedure Date: 09/28/2017 1:32 PM MRN: [id number] Account #: [id number] Date of Birth: 06/16/1955 Admit Type: Inpatient Age: 62 Room: [facility] ENDO ROOM 1 Gender: Female Note Status: Finalized Procedure:            Upper GI endoscopy Indications:          Iron deficiency anemia due to suspected upper                        gastrointestinal bleeding Providers:            Benay Pike. Alice Reichert MD, MD Referring MD:         No Local Md, MD (Referring MD) Medicines:            Propofol per Anesthesia Complications:        No immediate complications. Procedure:            Pre-Anesthesia Assessment:                       - The risks and benefits of the procedure and the                        sedation options and risks were discussed with the                        patient. All questions were answered and informed                        consent was obtained.                       - Patient identification and proposed procedure were                        verified prior to the procedure by the nurse. The                        procedure was verified in the procedure room.                       - ASA Grade Assessment: III - A patient with severe                        systemic disease.                       - After reviewing the risks and benefits, the patient                        was deemed in satisfactory condition to undergo the                        procedure.                       After obtaining informed consent, the endoscope was                        passed under direct vision. Throughout the procedure,  the patient's blood pressure, pulse, and oxygen                        saturations were monitored continuously. The Endoscope                        was introduced through the mouth, and advanced to the                        third part of duodenum. The upper GI endoscopy was               accomplished without difficulty. The patient tolerated                        the procedure well. Findings:      The examined esophagus was normal.      The entire examined stomach was normal.      The examined duodenum was normal. Impression:           - Normal esophagus.                       - Normal stomach.                       - Normal examined duodenum.                       - No specimens collected. Recommendation:       - Proceed with colonoscopy Procedure Code(s):    --- Professional ---                       [phone], Esophagogastroduodenoscopy, flexible, transoral;                        diagnostic, including collection of specimen(s) by                        brushing or washing, when performed (separate procedure) Diagnosis Code(s):    --- Professional ---                       D50.9, Iron deficiency anemia, unspecified CPT copyright 2017 American Medical Association. All rights reserved. The codes documented in this report are preliminary and upon coder review may  be revised to meet current compliance requirements. Efrain Sella MD, MD 09/28/2017 2:01:58 PM This report has been signed electronically. Number of Addenda: 0 Note Initiated On: 09/28/2017 1:32 PM      [facility]

## 2017-09-28 NOTE — Interval H&P Note (Signed)
History and Physical Interval Note:  09/28/2017 1:41 PM  [name]  has presented today for surgery, with the diagnosis of GI Bleed, symptomatic anemia  The various methods of treatment have been discussed with the patient and family. After consideration of risks, benefits and other options for treatment, the patient has consented to  Procedure(s): ESOPHAGOGASTRODUODENOSCOPY (EGD) (N/A) COLONOSCOPY (N/A) as a surgical intervention .  The patient's history has been reviewed, patient examined, no change in status, stable for surgery.  I have reviewed the patient's chart and labs.  Questions were answered to the patient's satisfaction.     [place], [place]

## 2017-09-28 NOTE — Progress Notes (Signed)
On call paged for ongoing POC for pt (I.e. Drips, diet). Still awaiting for H/H results.

## 2017-09-28 NOTE — Progress Notes (Signed)
Pt was given discharge packet and was getting ready to be wheeled out. Upon entering room, noticed blood on the floor.  Pt said she "did not make it". Had more bloody stool in the toilet per pt. Paged Dr. Darvin Neighbours. To cancel discharge and order stat H/H. Will await more orders.

## 2017-09-28 NOTE — Anesthesia Preprocedure Evaluation (Signed)
Anesthesia Evaluation  Patient identified by MRN, date of birth, ID band Patient awake    Reviewed: Allergy & Precautions, H&P , NPO status , Patient's Chart, lab work & pertinent test results, reviewed documented beta blocker date and time   History of Anesthesia Complications Negative for: history of anesthetic complications  Airway Mallampati: II   Neck ROM: full    Dental  (+) Poor Dentition, Missing, Dental Advidsory Given   Pulmonary neg pulmonary ROS, shortness of breath and with exertion, neg sleep apnea, pneumonia, COPD, neg recent URI, Current Smoker,           Cardiovascular Exercise Tolerance: Poor hypertension, (-) angina+ CAD, + Past MI, + CABG and +CHF  (-) Cardiac Stents negative cardio ROS  (-) dysrhythmias (-) Valvular Problems/Murmurs     Neuro/Psych negative neurological ROS  negative psych ROS   GI/Hepatic negative GI ROS, Neg liver ROS,   Endo/Other  diabetes, Well Controlled, Type 1, Insulin Dependent  Renal/GU negative Renal ROS  negative genitourinary   Musculoskeletal   Abdominal   Peds  Hematology  (+) anemia ,   Anesthesia Other Findings Past Medical History: No date: CHF (congestive heart failure) (HCC) No date: COPD (chronic obstructive pulmonary disease) (HCC) No date: Diabetes mellitus without complication (HCC) No date: NSTEMI (non-ST elevated myocardial infarction) ([place]) Past Surgical History: No date: APPENDECTOMY No date: CARDIAC CATHETERIZATION No date: CHOLECYSTECTOMY 12/19/2016: ESOPHAGOGASTRODUODENOSCOPY; N/A     Comment:  Procedure: ESOPHAGOGASTRODUODENOSCOPY (EGD);  Surgeon:               Lin Landsman, MD;  Location: [facility] ENDOSCOPY;                Service: Gastroenterology;  Laterality: N/A; 12/23/2016: LEFT HEART CATH AND CORONARY ANGIOGRAPHY; N/A     Comment:  Procedure: LEFT HEART CATH AND CORONARY ANGIOGRAPHY;                Surgeon: Teodoro Spray, MD;   Location: [place] CV              LAB;  Service: Cardiovascular;  Laterality: N/A; BMI    Body Mass Index:  26.63 kg/m     Reproductive/Obstetrics negative OB ROS                             Anesthesia Physical  Anesthesia Plan  ASA: IV  Anesthesia Plan: General   Post-op Pain Management:    Induction: Intravenous  PONV Risk Score and Plan: 2 and Propofol infusion and TIVA  Airway Management Planned: Natural Airway and Nasal Cannula  Additional Equipment:   Intra-op Plan:   Post-operative Plan:   Informed Consent: I have reviewed the patients History and Physical, chart, labs and discussed the procedure including the risks, benefits and alternatives for the proposed anesthesia with the patient or authorized representative who has indicated his/her understanding and acceptance.   Dental Advisory Given  Plan Discussed with: CRNA  Anesthesia Plan Comments:         Anesthesia Quick Evaluation

## 2017-09-28 NOTE — Progress Notes (Signed)
[place] at [place] NAME: [name]    MR#:  [id number]  DATE OF BIRTH:  12-16-1955  SUBJECTIVE:  CHIEF COMPLAINT:   Chief Complaint  Patient presents with  . Chest Pain   Feels better.  N.p.o. for procedures later today.  No acute bleeding.  REVIEW OF SYSTEMS:    Review of Systems  Constitutional: Positive for malaise/fatigue. Negative for chills and fever.  HENT: Negative for sore throat.   Eyes: Negative for blurred vision, double vision and pain.  Respiratory: Negative for cough, hemoptysis, shortness of breath and wheezing.   Cardiovascular: Negative for chest pain, palpitations, orthopnea and leg swelling.  Gastrointestinal: Negative for abdominal pain, constipation, diarrhea, heartburn, nausea and vomiting.  Genitourinary: Negative for dysuria and hematuria.  Musculoskeletal: Negative for back pain and joint pain.  Skin: Negative for rash.  Neurological: Negative for sensory change, speech change, focal weakness and headaches.  Endo/Heme/Allergies: Does not bruise/bleed easily.  Psychiatric/Behavioral: Negative for depression. The patient is not nervous/anxious.     DRUG ALLERGIES:   Allergies  Allergen Reactions  . Latex Anaphylaxis and Rash  . Gabapentin Nausea And Vomiting and Other (See Comments)    VITALS:  Blood pressure (!) 148/77, pulse 79, temperature 97.7 F (36.5 C), temperature source Oral, resp. rate 14, height 5' 5"  (1.651 m), weight 73.5 kg, SpO2 100 %.  PHYSICAL EXAMINATION:   Physical Exam  GENERAL:  62 y.o.-year-old patient lying in the bed with no acute distress.  EYES: Pupils equal, round, reactive to light and accommodation. No scleral icterus. Extraocular muscles intact.  HEENT: Head atraumatic, normocephalic. Oropharynx and nasopharynx clear.  NECK:  Supple, no jugular venous distention. No thyroid enlargement, no tenderness.  LUNGS: Normal breath sounds bilaterally, no wheezing, rales,  rhonchi. No use of accessory muscles of respiration.  CARDIOVASCULAR: S1, S2 normal. No murmurs, rubs, or gallops.  ABDOMEN: Soft, nontender, nondistended. Bowel sounds present. No organomegaly or mass.  EXTREMITIES: No cyanosis, clubbing or edema b/l.    NEUROLOGIC: Cranial nerves II through XII are intact. No focal Motor or sensory deficits b/l.   PSYCHIATRIC: The patient is alert and oriented x 3.  SKIN: No obvious rash, lesion, or ulcer.   LABORATORY PANEL:   CBC Recent Labs  Lab 09/27/17 0403  09/28/17 0450  WBC 7.8  --   --   HGB 7.6*   < > 7.4*  HCT 23.4*  --   --   PLT 112*  --   --    < > = values in this interval not displayed.   ------------------------------------------------------------------------------------------------------------------ Chemistries  Recent Labs  Lab 09/27/17 0403  NA 138  K 3.5  CL 110  CO2 22  GLUCOSE 112*  BUN 15  CREATININE 0.95  CALCIUM 8.4*   ------------------------------------------------------------------------------------------------------------------  Cardiac Enzymes Recent Labs  Lab 09/26/17 2044  TROPONINI <0.03   ------------------------------------------------------------------------------------------------------------------  RADIOLOGY:  Dg Chest 2 View  Result Date: 09/26/2017 CLINICAL DATA:  LEFT side chest pain, history CHF, COPD, diabetes mellitus, smoker, NSTEMI EXAM: CHEST - 2 VIEW COMPARISON:  08/26/2017 FINDINGS: 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 Mediastinal contours and pulmonary vascularity normal. Lungs clear. No acute infiltrate, pleural effusion or pneumothorax. Bones demineralized. 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 No acute abnormalities. Electronically Signed   By: Lavonia Dana M.D.   On: 09/26/2017 21:30     ASSESSMENT AND PLAN:   * GI bleed Etiology unclear. Prior EGD reviewed  showed gastric irritation. Nothing acute.  * Acute blood loss anemia  over chronic anemia Transfused 2 units PRBC Hb at 7.5  * DM SSI  *  Chronic systolic heart failure  Monitor for fluid overload.  *   COPD (chronic obstructive pulmonary disease) (HCC) -home dose inhalers.  All the records are reviewed and case discussed with Care Management/Social Worker Management plans discussed with the patient, family and they are in agreement.  CODE STATUS: FULL CODE  DVT Prophylaxis: SCDs  TOTAL TIME TAKING CARE OF THIS PATIENT: 30 minutes.   POSSIBLE D/C IN 1-2 DAYS, DEPENDING ON CLINICAL CONDITION.  Neita Carp M.D on 09/28/2017 at 12:06 PM  Between 7am to 6pm - Pager - [phone]  After 6pm go to [email or web address] - password [email or web address] [place] [facility]  Office  [phone]  CC: Primary care physician; Patient, No Pcp Per  Note: This dictation was prepared with Dragon dictation along with smaller phrase technology. Any transcriptional errors that result from this process are unintentional.

## 2017-09-28 NOTE — Transfer of Care (Signed)
Immediate Anesthesia Transfer of Care Note  Patient: [name]  Procedure(s) Performed: ESOPHAGOGASTRODUODENOSCOPY (EGD) (N/A ) COLONOSCOPY (N/A )  Patient Location: PACU  Anesthesia Type:General  Level of Consciousness: drowsy  Airway & Oxygen Therapy: Patient Spontanous Breathing and Patient connected to nasal cannula oxygen  Post-op Assessment: Report given to RN and Post -op Vital signs reviewed and stable  Post vital signs: Reviewed and stable  Last Vitals:  Vitals Value Taken Time  BP    Temp    Pulse    Resp    SpO2      Last Pain:  Vitals:   09/28/17 1100  TempSrc: Oral  PainSc:       Patients Stated Pain Goal: 7 (16/12/24 0018)  Complications: No apparent anesthesia complications

## 2017-09-29 LAB — GLUCOSE, CAPILLARY
GLUCOSE-CAPILLARY: 108 mg/dL — AB (ref 70–99)
GLUCOSE-CAPILLARY: 112 mg/dL — AB (ref 70–99)
GLUCOSE-CAPILLARY: 184 mg/dL — AB (ref 70–99)
Glucose-Capillary: 156 mg/dL — ABNORMAL HIGH (ref 70–99)

## 2017-09-29 LAB — FOLATE: Folate: 4.4 ng/mL — ABNORMAL LOW (ref >5.9)

## 2017-09-29 LAB — HEMOGLOBIN: Hemoglobin: 7.5 g/dL — ABNORMAL LOW (ref 12.0–16.0)

## 2017-09-29 LAB — IRON AND TIBC
Iron: 13 ug/dL — ABNORMAL LOW (ref 28–170)
SATURATION RATIOS: 5 % — AB (ref 10.4–31.8)
TIBC: 291 ug/dL (ref 250–450)
UIBC: 278 ug/dL

## 2017-09-29 LAB — FERRITIN: FERRITIN: 13 ng/mL (ref 11–307)

## 2017-09-29 LAB — VITAMIN B12: Vitamin B-12: 286 pg/mL (ref 180–914)

## 2017-09-29 MED ORDER — PANTOPRAZOLE SODIUM 40 MG IV SOLR
40.0000 mg | Freq: Two times a day (BID) | INTRAVENOUS | Status: DC
  Administered 2017-09-29: 40 mg via INTRAVENOUS
  Filled 2017-09-29 (×2): qty 40

## 2017-09-29 MED ORDER — FOLIC ACID 1 MG PO TABS
1.0000 mg | ORAL_TABLET | Freq: Every day | ORAL | Status: DC
  Administered 2017-09-29 – 2017-09-30 (×2): 1 mg via ORAL
  Filled 2017-09-29 (×2): qty 1

## 2017-09-29 MED ORDER — FERROUS SULFATE 325 (65 FE) MG PO TABS
325.0000 mg | ORAL_TABLET | Freq: Two times a day (BID) | ORAL | Status: DC
  Administered 2017-09-29 – 2017-09-30 (×2): 325 mg via ORAL
  Filled 2017-09-29 (×2): qty 1

## 2017-09-29 NOTE — Progress Notes (Signed)
[place] at [place] NAME: [name]    MR#:  [id number]  DATE OF BIRTH:  [birth year]  SUBJECTIVE: Patient had episode of rectal bleed yesterday so discharge canceled.  Now denies any complaints, spoke with GI, recommends iron studies.  CHIEF COMPLAINT:   Chief Complaint  Patient presents with  . Chest Pain     REVIEW OF SYSTEMS:    Review of Systems  Constitutional: Negative for chills and fever.  HENT: Negative for sore throat.   Eyes: Negative for blurred vision, double vision and pain.  Respiratory: Negative for cough, hemoptysis, shortness of breath and wheezing.   Cardiovascular: Negative for chest pain, palpitations, orthopnea and leg swelling.  Gastrointestinal: Negative for abdominal pain, constipation, diarrhea, heartburn, nausea and vomiting.  Genitourinary: Negative for dysuria and hematuria.  Musculoskeletal: Negative for back pain and joint pain.  Skin: Negative for rash.  Neurological: Negative for sensory change, speech change, focal weakness and headaches.  Endo/Heme/Allergies: Does not bruise/bleed easily.  Psychiatric/Behavioral: Negative for depression. The patient is not nervous/anxious.     DRUG ALLERGIES:   Allergies  Allergen Reactions  . Latex Anaphylaxis and Rash  . Gabapentin Nausea And Vomiting and Other (See Comments)    VITALS:  Blood pressure 138/67, pulse 89, temperature 98.2 F (36.8 C), temperature source Oral, resp. rate 17, height 5' 5"  (1.651 m), weight 73.5 kg, SpO2 100 %.  PHYSICAL EXAMINATION:   Physical Exam  GENERAL:  62 y.o.-year-old patient lying in the bed with no acute distress.  EYES: Pupils equal, round, reactive to light and accommodation. No scleral icterus. Extraocular muscles intact.  HEENT: Head atraumatic, normocephalic. Oropharynx and nasopharynx clear.  NECK:  Supple, no jugular venous distention. No thyroid enlargement, no tenderness.  LUNGS: Normal breath sounds  bilaterally, no wheezing, rales, rhonchi. No use of accessory muscles of respiration.  CARDIOVASCULAR: S1, S2 normal. No murmurs, rubs, or gallops.  ABDOMEN: Soft, nontender, nondistended. Bowel sounds present. No organomegaly or mass.  EXTREMITIES: No cyanosis, clubbing or edema b/l.    NEUROLOGIC: Cranial nerves II through XII are intact. No focal Motor or sensory deficits b/l.   PSYCHIATRIC: The patient is alert and oriented x 3.  SKIN: No obvious rash, lesion, or ulcer.   LABORATORY PANEL:   CBC Recent Labs  Lab 09/27/17 0403  09/28/17 1843 09/29/17 0415  WBC 7.8  --   --   --   HGB 7.6*   < > 8.6* 7.5*  HCT 23.4*  --  26.7*  --   PLT 112*  --   --   --    < > = values in this interval not displayed.   ------------------------------------------------------------------------------------------------------------------ Chemistries  Recent Labs  Lab 09/27/17 0403  NA 138  K 3.5  CL 110  CO2 22  GLUCOSE 112*  BUN 15  CREATININE 0.95  CALCIUM 8.4*   ------------------------------------------------------------------------------------------------------------------  Cardiac Enzymes Recent Labs  Lab 09/26/17 2044  TROPONINI <0.03   ------------------------------------------------------------------------------------------------------------------  RADIOLOGY:  No results found.   ASSESSMENT AND PLAN:   * GI bleed, negative EGD, colonoscopy but hemoglobin was 5 on admission received 2 units of packed RBC transfusion.  Iron levels are ordered, patient was about to be discharged but noticed a big amount of rectal bleed or discharge canceled hemoglobin this morning 7.5. .    * Acute blood loss anemia over chronic anemia Transfused 2 units PRBC Hb at 7.5.  Iron level is 13, ferritin 13.e 4.4.  *  DM SSI  *  Chronic systolic heart failure  Monitor for fluid overload.  *   COPD (chronic obstructive pulmonary disease) (HCC) -home dose inhalers.  All the records are  reviewed and case discussed with Care Management/Social Worker Management plans discussed with the patient, family and they are in agreement.  CODE STATUS: FULL CODE  DVT Prophylaxis: SCDs  TOTAL TIME TAKING CARE OF THIS PATIENT: 30 minutes.   POSSIBLE D/C IN 1-2 DAYS, DEPENDING ON CLINICAL CONDITION.  Epifanio Lesches M.D on 09/29/2017 at 2:13 PM  Between 7am to 6pm - Pager - [phone]  After 6pm go to [email or web address] - password [email or web address] [email or web address] [facility]  Office  [phone]  CC: Primary care physician; Patient, No Pcp Per  Note: This dictation was prepared with Dragon dictation along with smaller phrase technology. Any transcriptional errors that result from this process are unintentional.

## 2017-09-29 NOTE — Anesthesia Postprocedure Evaluation (Addendum)
Anesthesia Post Note  Patient: [name]  Procedure(s) Performed: ESOPHAGOGASTRODUODENOSCOPY (EGD) (N/A ) COLONOSCOPY (N/A )  Anesthesia Type: General     Last Vitals:  Vitals:   09/29/17 0531 09/29/17 1319  BP: (!) 143/72 138/67  Pulse: 87 89  Resp: 16 17  Temp: 36.8 C 36.8 C  SpO2: 100% 100%    Last Pain:  Vitals:   09/29/17 1319  TempSrc: Oral  PainSc:                  Durenda Hurt

## 2017-09-30 LAB — GLUCOSE, CAPILLARY: GLUCOSE-CAPILLARY: 102 mg/dL — AB (ref 70–99)

## 2017-09-30 MED ORDER — FOLIC ACID 1 MG PO TABS: 1.0000 mg | ORAL_TABLET | Freq: Every day | ORAL | 0 refills | Status: DC

## 2017-09-30 MED ORDER — FERROUS SULFATE 325 (65 FE) MG PO TABS: 325.0000 mg | ORAL_TABLET | Freq: Two times a day (BID) | ORAL | 3 refills | Status: DC

## 2017-09-30 NOTE — Progress Notes (Signed)
09/30/2017 10:51 AM  [name] to be D/C'd Home per MD order.  Discussed prescriptions and follow up appointments with the patient. Prescriptions given to patient, medication list explained in detail. Pt verbalized understanding.  Allergies as of 09/30/2017      Reactions   Latex Anaphylaxis, Rash   Gabapentin Nausea And Vomiting, Other (See Comments)      Medication List    TAKE these medications   atorvastatin 40 MG tablet Commonly known as:  LIPITOR Take 1 tablet (40 mg total) by mouth daily.   blood glucose meter kit and supplies Kit Dispense based on patient and insurance preference. Use up to four times daily as directed. (FOR ICD-9 250.00, 250.01).   carvedilol 3.125 MG tablet Commonly known as:  COREG Take 1 tablet (3.125 mg total) by mouth 2 (two) times daily with a meal.   ferrous sulfate 325 (65 FE) MG tablet Take 1 tablet (325 mg total) by mouth 2 (two) times daily with a meal.   folic acid 1 MG tablet Commonly known as:  FOLVITE Take 1 tablet (1 mg total) by mouth daily.   furosemide 40 MG tablet Commonly known as:  LASIX Take 1 tablet (40 mg total) by mouth 2 (two) times daily.   Insulin Glargine 100 UNIT/ML Solostar Pen Commonly known as:  LANTUS Inject 15 Units into the skin daily.   Insulin Pen Needle 32G X 5 MM Misc 1 Units by Does not apply route daily.   isosorbide mononitrate 30 MG 24 hr tablet Commonly known as:  IMDUR Take 1 tablet (30 mg total) by mouth daily.   ondansetron 4 MG tablet Commonly known as:  ZOFRAN Take 1 tablet (4 mg total) by mouth every 6 (six) hours as needed for nausea.   pantoprazole 40 MG tablet Commonly known as:  PROTONIX Take 1 tablet (40 mg total) by mouth 2 (two) times daily before a meal.   pregabalin 100 MG capsule Commonly known as:  LYRICA Take 100 mg by mouth 3 (three) times daily.       Vitals:   09/29/17 2119 09/30/17 0537  BP: (!) 149/99 (!) 143/69  Pulse: 89 86  Resp: 20 20  Temp: 98.3  F (36.8 C) 98.2 F (36.8 C)  SpO2: 98% 100%    Skin clean, dry and intact without evidence of skin break down, no evidence of skin tears noted. IV catheter discontinued intact. Site without signs and symptoms of complications. Dressing and pressure applied. Pt denies pain at this time. No complaints noted.  An After Visit Summary was printed and given to the patient. Patient escorted via WC, and D/C home via private auto.  ,  E  

## 2017-09-30 NOTE — Progress Notes (Signed)
Patient stable for discharge, discharge instructions are in the computer, has iron deficiency, folate deficiency anemia, iron, folic acid supplements are given.  I spoke with Dr. Alice Reichert, yesterday recommended that patient can follow-up with him in the office.  Discussed with patient.  Patient agreeable to go home.  Requested home health nursing.

## 2017-09-30 NOTE — Care Management Note (Signed)
Case Management Note  Patient Details  Name: [name] MRN: [id number] Date of Birth: 24-Oct-1955  Subjective/Objective:    Patient to be discharged per MD order. Orders in place for home health services. Patient tells me that she does not really have a need for skilled nursing services and has worries over insurance coverage. I informed her we could place referrals and see how things went but she is actually interested in services for her husband more than herself. 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 Her spouse is wheelchair bound and the patient struggles with activities around the house such as cooking, cleaning, etc. I informed her about some personal care services in the area and she thought that was more appropriate for her needs. I have provided a list of PCS and her plan is to call them in the hospital or once she is discharged to ease her care giving burden. Patient has a walker in the home but she does not use it. She has no PCP I have provided her an Dealer. No further RNCM needs.                Ines Bloomer RN BSN RNCM [phone]   Action/Plan:   Expected Discharge Date:  09/30/17               Expected Discharge Plan:  Home/Self Care  In-House Referral:     Discharge [facility]  Post Acute Care Choice:    Choice offered to:     DME Arranged:    DME Agency:     HH Arranged:  Patient Refused HH, PCS/Personal Care Services [facility] Agency:     Status of Service:  Completed, signed off  If discussed at [place] of Stay Meetings, dates discussed:    Additional Comments:  [name] [name], RN 09/30/2017, 10:08 AM

## 2017-10-02 ENCOUNTER — Encounter: Payer: Self-pay | Admitting: Internal Medicine

## 2017-10-02 LAB — SURGICAL PATHOLOGY

## 2017-10-02 NOTE — Discharge Summary (Signed)
[name], is a 62 y.o. female  DOB 10/13/1955  MRN [id number].  Admission date:  09/26/2017  Admitting Physician  Lance Coon, MD  Discharge Date:  09/30/2017   Primary MD  Patient, No Pcp Per  Recommendations for primary care physician for things to follow:   Follow-up with Dr. Alice Reichert in 2 weeks. Follow-up with PCP [facility] in 1 to 2 weeks as scheduled.  Admission Diagnosis  UGIB (upper gastrointestinal bleed) [K92.2] Chest pain, unspecified type [R07.9] Acute on chronic blood loss anemia [D62]   Discharge Diagnosis  UGIB (upper gastrointestinal bleed) [K92.2] Chest pain, unspecified type [R07.9] Acute on chronic blood loss anemia [D62]    Principal Problem:   GI bleed Active Problems:   Chronic systolic heart failure (HCC)   Diabetes (HCC)   COPD (chronic obstructive pulmonary disease) (HCC)   Symptomatic anemia      Past Medical History:  Diagnosis Date  . CHF (congestive heart failure) ([place])   . COPD (chronic obstructive pulmonary disease) ([place])   . Diabetes mellitus without complication ([place])   . NSTEMI (non-ST elevated myocardial infarction) [facility])     Past Surgical History:  Procedure Laterality Date  . APPENDECTOMY    . CARDIAC CATHETERIZATION    . CHOLECYSTECTOMY    . COLONOSCOPY N/A 09/28/2017   Procedure: COLONOSCOPY;  Surgeon: Toledo, Benay Pike, MD;  Location: [facility] ENDOSCOPY;  Service: Gastroenterology;  Laterality: N/A;  . ESOPHAGOGASTRODUODENOSCOPY N/A 12/19/2016   Procedure: ESOPHAGOGASTRODUODENOSCOPY (EGD);  Surgeon: Lin Landsman, MD;  Location: [facility] ENDOSCOPY;  Service: Gastroenterology;  Laterality: N/A;  . ESOPHAGOGASTRODUODENOSCOPY N/A 09/28/2017   Procedure: ESOPHAGOGASTRODUODENOSCOPY (EGD);  Surgeon: Toledo, Benay Pike, MD;  Location: [facility] ENDOSCOPY;  Service: Gastroenterology;   Laterality: N/A;  . ESOPHAGOGASTRODUODENOSCOPY (EGD) WITH PROPOFOL N/A 08/08/2017   Procedure: ESOPHAGOGASTRODUODENOSCOPY (EGD) WITH PROPOFOL;  Surgeon: [name], MD;  Location: [facility] ENDOSCOPY;  Service: Endoscopy;  Laterality: N/A;  . LEFT HEART CATH AND CORONARY ANGIOGRAPHY N/A 12/23/2016   Procedure: LEFT HEART CATH AND CORONARY ANGIOGRAPHY;  Surgeon: Teodoro Spray, MD;  Location: [place] CV LAB;  Service: Cardiovascular;  Laterality: N/A;       History of present illness and  Hospital Course:     Kindly see H&P for history of present illness and admission details, please review complete Labs, Consult reports and Test reports for all details in brief  HPI  from the history and physical done on the day of admission 62 year old female patient came in because of renal pain, shortness of breath, weakness, fatigue, found to have a hemoglobin of 5.  Patient had melena at home.  Patient found to have requested to in the emergency room.  Hospital Course  #1 acute symptomatic anemia with hemoglobin of 5 on admission, status post 2 units of packed RBC transfusion, patient hemoglobin improved to 7.5. 2.  GI bleed with negative EGD, colonoscopy, hemoglobin 5 on admission received 2 units of packed RBC.  EGD, colonoscopy done on this admission by gastroenterology Dr. Alice Reichert, EGD did not show any bleeding ulcer.  Colonoscopy showed internal hemorrhoids.  And also had a 5 mm polyp in sigmoid colon which was removed.  Patient had anemia work-up which showed iron deficiency, folate deficiency, level was 13, folate level was 4.4, discharged home with ferrous sulfate, folic acid tablets.  Gastroenterology did not recommend any further work-up and advised her to discharge and see her in GI office is eager to go home and will discharge her with home health physical  therapy. #2 diabetes mellitus type 2: Advised patient to continue her home dose insulin. 3.  Chronic diastolic heart failure: Patient on  beta-blockers, Lasix at home.  Continue them.  EF 50 to 55% by recent echo in August.  Previous echo done in December 2018 showed EF of 40 to 45% but now improved.  Discharge Condition: stable   Follow UP  Follow-up Information    [place], Benay Pike, MD In 1 week.   Specialty:  Gastroenterology Why:  capsule endoscopy OFFICE WILL CALL YOU TO SCHEDULE Contact information: [place]  [id number] [phone]             Discharge Instructions  and  Discharge Medications    Allergies as of 09/30/2017      Reactions   Latex Anaphylaxis, Rash   Gabapentin Nausea And Vomiting, Other (See Comments)      Medication List    TAKE these medications   atorvastatin 40 MG tablet Commonly known as:  LIPITOR Take 1 tablet (40 mg total) by mouth daily.   blood glucose meter kit and supplies Kit Dispense based on patient and insurance preference. Use up to four times daily as directed. (FOR ICD-9 250.00, 250.01).   carvedilol 3.125 MG tablet Commonly known as:  COREG Take 1 tablet (3.125 mg total) by mouth 2 (two) times daily with a meal.   ferrous sulfate 325 (65 FE) MG tablet Take 1 tablet (325 mg total) by mouth 2 (two) times daily with a meal.   folic acid 1 MG tablet Commonly known as:  FOLVITE Take 1 tablet (1 mg total) by mouth daily.   furosemide 40 MG tablet Commonly known as:  LASIX Take 1 tablet (40 mg total) by mouth 2 (two) times daily.   Insulin Glargine 100 UNIT/ML Solostar Pen Commonly known as:  LANTUS Inject 15 Units into the skin daily.   Insulin Pen Needle 32G X 5 MM Misc 1 Units by Does not apply route daily.   isosorbide mononitrate 30 MG 24 hr tablet Commonly known as:  IMDUR Take 1 tablet (30 mg total) by mouth daily.   ondansetron 4 MG tablet Commonly known as:  ZOFRAN Take 1 tablet (4 mg total) by mouth every 6 (six) hours as needed for nausea.   pantoprazole 40 MG tablet Commonly known as:  PROTONIX Take 1 tablet (40  mg total) by mouth 2 (two) times daily before a meal.   pregabalin 100 MG capsule Commonly known as:  LYRICA Take 100 mg by mouth 3 (three) times daily.         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 Mediastinal contours and pulmonary vascularity normal. Lungs clear. No acute infiltrate, pleural effusion or pneumothorax. Bones demineralized. 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 No acute abnormalities. Electronically Signed   By: Lavonia Dana M.D.   On: 09/26/2017 21:30    Micro Results     Recent Results (from the past 240 hour(s))  MRSA PCR Screening     Status: None   Collection Time: 09/27/17 12:07 AM  Result Value Ref Range Status   MRSA by PCR NEGATIVE NEGATIVE Final    Comment:        The GeneXpert MRSA Assay (FDA approved for NASAL specimens only), is one component of a comprehensive MRSA colonization surveillance program. It is not intended to diagnose MRSA infection nor to guide or monitor treatment for MRSA infections. Performed at [facility], [address] [place], [place] [address]        Today   Subjective:   Joselynne Killam today has no headache,no chest abdominal pain,no new weakness tingling or numbness, feels much better wants to go home today.   Objective:   Blood pressure (!) 143/69, pulse 86, temperature 98.2 F (36.8 C), temperature source Oral, resp. rate 20, height 5' 5"  (1.651 m), weight 73.5 kg, SpO2 100 %.  No intake or output data in the 24 hours ending 10/02/17  1234  Exam Awake Alert, Oriented x 3, No new F.N deficits, Normal affect [place].AT,PERRAL Supple Neck,No JVD, No cervical lymphadenopathy appriciated.  Symmetrical Chest wall movement, Good air movement bilaterally, CTAB RRR,No Gallops,Rubs or new Murmurs, No Parasternal Heave +ve B.Sounds, Abd Soft, Non tender, No organomegaly appriciated, No rebound -guarding or rigidity. No Cyanosis, Clubbing or edema, No new Rash or bruise  Data Review   CBC w Diff:  Lab Results  Component Value Date   WBC 7.8 09/27/2017   HGB 7.5 (L) 09/29/2017   HGB 15.0 08/03/2013   HCT 26.7 (L) 09/28/2017   HCT 45.7 08/03/2013   PLT 112 (L) 09/27/2017   PLT 118 (L) 08/03/2013   LYMPHOPCT 32 12/18/2016   LYMPHOPCT 30.9 08/03/2013   MONOPCT 5 12/18/2016   MONOPCT 6.7 08/03/2013   EOSPCT 2 12/18/2016   EOSPCT 3.0 08/03/2013   BASOPCT 1 12/18/2016   BASOPCT 0.7 08/03/2013    CMP:  Lab Results  Component Value Date   NA 138 09/27/2017   NA 139 08/03/2013   K 3.5 09/27/2017   K 3.4 (L) 08/03/2013   CL 110 09/27/2017   CL 104 08/03/2013   CO2 22 09/27/2017   CO2 27 08/03/2013   BUN 15 09/27/2017   BUN 16 08/03/2013   CREATININE 0.95 09/27/2017   CREATININE 1.19 08/03/2013   PROT 8.1 08/26/2017   PROT 9.3 (H) 03/12/2012   ALBUMIN 3.5 08/26/2017   ALBUMIN 3.7 03/12/2012   BILITOT 0.6 08/26/2017   BILITOT 0.5 03/12/2012   ALKPHOS 98 08/26/2017   ALKPHOS 199 (H) 03/12/2012   AST 15 08/26/2017   AST 40 (H) 03/12/2012   ALT 8 08/26/2017   ALT 34 03/12/2012  .   Total Time in preparing paper work, data evaluation and todays exam - 35 minutes  Epifanio Lesches M.D on 09/30/2017 at 12:34 PM    Note: This dictation was prepared with Dragon dictation along with smaller phrase technology. Any transcriptional errors that result from this process are unintentional.

## 2017-11-03 ENCOUNTER — Inpatient Hospital Stay
Admission: EM | Admit: 2017-11-03 | Discharge: 2017-11-06 | DRG: 378 | Disposition: A | Discharge status: Home / Self Care | Payer: Medicaid Other | Attending: Internal Medicine | Admitting: Internal Medicine

## 2017-11-03 ENCOUNTER — Other Ambulatory Visit: Payer: Self-pay

## 2017-11-03 ENCOUNTER — Encounter: Payer: Self-pay | Admitting: Emergency Medicine

## 2017-11-03 DIAGNOSIS — D500 Iron deficiency anemia secondary to blood loss (chronic): Secondary | ICD-10-CM | POA: Diagnosis present

## 2017-11-03 DIAGNOSIS — K31811 Angiodysplasia of stomach and duodenum with bleeding: Principal | ICD-10-CM | POA: Diagnosis present

## 2017-11-03 DIAGNOSIS — E538 Deficiency of other specified B group vitamins: Secondary | ICD-10-CM | POA: Diagnosis present

## 2017-11-03 DIAGNOSIS — Z794 Long term (current) use of insulin: Secondary | ICD-10-CM

## 2017-11-03 DIAGNOSIS — Z9109 Other allergy status, other than to drugs and biological substances: Secondary | ICD-10-CM

## 2017-11-03 DIAGNOSIS — K922 Gastrointestinal hemorrhage, unspecified: Secondary | ICD-10-CM | POA: Diagnosis present

## 2017-11-03 DIAGNOSIS — Z8249 Family history of ischemic heart disease and other diseases of the circulatory system: Secondary | ICD-10-CM

## 2017-11-03 DIAGNOSIS — F17210 Nicotine dependence, cigarettes, uncomplicated: Secondary | ICD-10-CM | POA: Diagnosis present

## 2017-11-03 DIAGNOSIS — Z91040 Latex allergy status: Secondary | ICD-10-CM

## 2017-11-03 DIAGNOSIS — Z833 Family history of diabetes mellitus: Secondary | ICD-10-CM

## 2017-11-03 DIAGNOSIS — E119 Type 2 diabetes mellitus without complications: Secondary | ICD-10-CM | POA: Diagnosis present

## 2017-11-03 DIAGNOSIS — J449 Chronic obstructive pulmonary disease, unspecified: Secondary | ICD-10-CM | POA: Diagnosis present

## 2017-11-03 DIAGNOSIS — Z9049 Acquired absence of other specified parts of digestive tract: Secondary | ICD-10-CM

## 2017-11-03 DIAGNOSIS — K5521 Angiodysplasia of colon with hemorrhage: Secondary | ICD-10-CM | POA: Diagnosis present

## 2017-11-03 DIAGNOSIS — I340 Nonrheumatic mitral (valve) insufficiency: Secondary | ICD-10-CM | POA: Diagnosis present

## 2017-11-03 DIAGNOSIS — I252 Old myocardial infarction: Secondary | ICD-10-CM

## 2017-11-03 DIAGNOSIS — D649 Anemia, unspecified: Secondary | ICD-10-CM | POA: Diagnosis present

## 2017-11-03 DIAGNOSIS — I5032 Chronic diastolic (congestive) heart failure: Secondary | ICD-10-CM | POA: Diagnosis present

## 2017-11-03 DIAGNOSIS — R195 Other fecal abnormalities: Secondary | ICD-10-CM

## 2017-11-03 LAB — CBC WITH DIFFERENTIAL/PLATELET
Abs Immature Granulocytes: 0.02 10*3/uL (ref 0.00–0.07)
BASOS ABS: 0 10*3/uL (ref 0.0–0.1)
Basophils Relative: 1 %
Eosinophils Absolute: 0.1 10*3/uL (ref 0.0–0.5)
Eosinophils Relative: 2 %
HCT: 26.1 % — ABNORMAL LOW (ref 36.0–46.0)
HEMOGLOBIN: 7 g/dL — AB (ref 12.0–15.0)
Immature Granulocytes: 0 %
LYMPHS PCT: 23 %
Lymphs Abs: 1.4 10*3/uL (ref 0.7–4.0)
MCH: 19.8 pg — ABNORMAL LOW (ref 26.0–34.0)
MCHC: 26.8 g/dL — ABNORMAL LOW (ref 30.0–36.0)
MCV: 73.9 fL — ABNORMAL LOW (ref 80.0–100.0)
Monocytes Absolute: 0.4 10*3/uL (ref 0.1–1.0)
Monocytes Relative: 7 %
NEUTROS ABS: 4 10*3/uL (ref 1.7–7.7)
Neutrophils Relative %: 67 %
Platelets: 170 10*3/uL (ref 150–400)
RBC: 3.53 MIL/uL — AB (ref 3.87–5.11)
RDW: 23.3 % — ABNORMAL HIGH (ref 11.5–15.5)
WBC: 6 10*3/uL (ref 4.0–10.5)
nRBC: 0 % (ref 0.0–0.2)

## 2017-11-03 LAB — PREPARE RBC (CROSSMATCH)

## 2017-11-03 LAB — BASIC METABOLIC PANEL
Anion gap: 7 (ref 5–15)
BUN: 21 mg/dL (ref 8–23)
CALCIUM: 8.7 mg/dL — AB (ref 8.9–10.3)
CHLORIDE: 106 mmol/L (ref 98–111)
CO2: 25 mmol/L (ref 22–32)
CREATININE: 1.33 mg/dL — AB (ref 0.44–1.00)
GFR calc Af Amer: 49 mL/min — ABNORMAL LOW (ref >60)
GFR calc non Af Amer: 42 mL/min — ABNORMAL LOW (ref >60)
GLUCOSE: 141 mg/dL — AB (ref 70–99)
POTASSIUM: 4.3 mmol/L (ref 3.5–5.1)
SODIUM: 138 mmol/L (ref 135–145)

## 2017-11-03 LAB — CBC
HCT: 25.7 % — ABNORMAL LOW (ref 36.0–46.0)
Hemoglobin: 7.4 g/dL — ABNORMAL LOW (ref 12.0–15.0)
MCH: 21.6 pg — AB (ref 26.0–34.0)
MCHC: 28.8 g/dL — AB (ref 30.0–36.0)
MCV: 74.9 fL — AB (ref 80.0–100.0)
Platelets: 146 10*3/uL — ABNORMAL LOW (ref 150–400)
RBC: 3.43 MIL/uL — ABNORMAL LOW (ref 3.87–5.11)
RDW: 22.5 % — AB (ref 11.5–15.5)
WBC: 7.1 10*3/uL (ref 4.0–10.5)
nRBC: 0 % (ref 0.0–0.2)

## 2017-11-03 LAB — GLUCOSE, CAPILLARY
GLUCOSE-CAPILLARY: 98 mg/dL (ref 70–99)
Glucose-Capillary: 126 mg/dL — ABNORMAL HIGH (ref 70–99)

## 2017-11-03 LAB — HEMOGLOBIN AND HEMATOCRIT, BLOOD
HCT: 25.4 % — ABNORMAL LOW (ref 36.0–46.0)
Hemoglobin: 7.3 g/dL — ABNORMAL LOW (ref 12.0–15.0)

## 2017-11-03 MED ORDER — INSULIN ASPART 100 UNIT/ML ~~LOC~~ SOLN
0.0000 [IU] | Freq: Three times a day (TID) | SUBCUTANEOUS | Status: DC
  Administered 2017-11-05: 12:00:00 2 [IU] via SUBCUTANEOUS
  Administered 2017-11-06: 17:00:00 3 [IU] via SUBCUTANEOUS
  Filled 2017-11-03 (×2): qty 1

## 2017-11-03 MED ORDER — FAMOTIDINE IN NACL 20-0.9 MG/50ML-% IV SOLN
20.0000 mg | Freq: Two times a day (BID) | INTRAVENOUS | Status: DC
  Administered 2017-11-03 – 2017-11-05 (×6): 20 mg via INTRAVENOUS
  Filled 2017-11-03 (×7): qty 50

## 2017-11-03 MED ORDER — FOLIC ACID 1 MG PO TABS
1.0000 mg | ORAL_TABLET | Freq: Every day | ORAL | Status: DC
  Administered 2017-11-04 – 2017-11-06 (×3): 1 mg via ORAL
  Filled 2017-11-03 (×3): qty 1

## 2017-11-03 MED ORDER — SODIUM CHLORIDE 0.9 % IV SOLN
10.0000 mL/h | Freq: Once | INTRAVENOUS | Status: AC
  Administered 2017-11-03: 10 mL/h via INTRAVENOUS

## 2017-11-03 MED ORDER — ATORVASTATIN CALCIUM 20 MG PO TABS
40.0000 mg | ORAL_TABLET | Freq: Every day | ORAL | Status: DC
  Administered 2017-11-03 – 2017-11-06 (×4): 40 mg via ORAL
  Filled 2017-11-03 (×4): qty 2

## 2017-11-03 MED ORDER — FERROUS SULFATE 325 (65 FE) MG PO TABS
325.0000 mg | ORAL_TABLET | Freq: Two times a day (BID) | ORAL | Status: DC
  Administered 2017-11-03 – 2017-11-06 (×6): 325 mg via ORAL
  Filled 2017-11-03 (×6): qty 1

## 2017-11-03 MED ORDER — ONDANSETRON HCL 4 MG PO TABS: 4.0000 mg | ORAL_TABLET | Freq: Four times a day (QID) | ORAL | Status: DC | PRN

## 2017-11-03 MED ORDER — ACETAMINOPHEN 650 MG RE SUPP: 650.0000 mg | Freq: Four times a day (QID) | RECTAL | Status: DC | PRN

## 2017-11-03 MED ORDER — IPRATROPIUM-ALBUTEROL 0.5-2.5 (3) MG/3ML IN SOLN
3.0000 mL | Freq: Four times a day (QID) | RESPIRATORY_TRACT | Status: DC
  Administered 2017-11-03 – 2017-11-04 (×3): 3 mL via RESPIRATORY_TRACT
  Filled 2017-11-03 (×3): qty 3

## 2017-11-03 MED ORDER — FUROSEMIDE 10 MG/ML IJ SOLN
20.0000 mg | Freq: Once | INTRAMUSCULAR | Status: AC
  Administered 2017-11-03: 20 mg via INTRAVENOUS
  Filled 2017-11-03: qty 2

## 2017-11-03 MED ORDER — INSULIN ASPART 100 UNIT/ML ~~LOC~~ SOLN: 0.0000 [IU] | Freq: Every day | SUBCUTANEOUS | Status: DC

## 2017-11-03 MED ORDER — SODIUM CHLORIDE 0.9% IV SOLUTION: Freq: Once | INTRAVENOUS | Status: DC

## 2017-11-03 MED ORDER — ACETAMINOPHEN 325 MG PO TABS: 650.0000 mg | ORAL_TABLET | Freq: Once | ORAL | Status: DC

## 2017-11-03 MED ORDER — INSULIN GLARGINE 100 UNIT/ML ~~LOC~~ SOLN
10.0000 [IU] | Freq: Every day | SUBCUTANEOUS | Status: DC
  Administered 2017-11-03 – 2017-11-05 (×3): 10 [IU] via SUBCUTANEOUS
  Filled 2017-11-03 (×4): qty 0.1

## 2017-11-03 MED ORDER — FUROSEMIDE 40 MG PO TABS
40.0000 mg | ORAL_TABLET | Freq: Two times a day (BID) | ORAL | Status: DC
  Administered 2017-11-03 – 2017-11-06 (×6): 40 mg via ORAL
  Filled 2017-11-03 (×7): qty 1

## 2017-11-03 MED ORDER — ONDANSETRON HCL 4 MG/2ML IJ SOLN: 4.0000 mg | Freq: Four times a day (QID) | INTRAMUSCULAR | Status: DC | PRN

## 2017-11-03 MED ORDER — HYDROCODONE-ACETAMINOPHEN 5-325 MG PO TABS
1.0000 | ORAL_TABLET | ORAL | Status: DC | PRN
  Filled 2017-11-03: qty 1

## 2017-11-03 MED ORDER — POLYETHYLENE GLYCOL 3350 17 G PO PACK: 17.0000 g | PACK | Freq: Every day | ORAL | Status: DC | PRN

## 2017-11-03 MED ORDER — PANTOPRAZOLE SODIUM 40 MG IV SOLR
40.0000 mg | Freq: Two times a day (BID) | INTRAVENOUS | Status: DC
  Administered 2017-11-03 – 2017-11-06 (×6): 40 mg via INTRAVENOUS
  Filled 2017-11-03 (×6): qty 40

## 2017-11-03 MED ORDER — CARVEDILOL 3.125 MG PO TABS
3.1250 mg | ORAL_TABLET | Freq: Two times a day (BID) | ORAL | Status: DC
  Administered 2017-11-03 – 2017-11-06 (×7): 3.125 mg via ORAL
  Filled 2017-11-03 (×7): qty 1

## 2017-11-03 MED ORDER — ACETAMINOPHEN 325 MG PO TABS: 650.0000 mg | ORAL_TABLET | Freq: Four times a day (QID) | ORAL | Status: DC | PRN

## 2017-11-03 MED ORDER — PREGABALIN 50 MG PO CAPS
100.0000 mg | ORAL_CAPSULE | Freq: Three times a day (TID) | ORAL | Status: DC
  Administered 2017-11-03 – 2017-11-06 (×8): 100 mg via ORAL
  Filled 2017-11-03 (×9): qty 2

## 2017-11-03 MED ORDER — DIPHENHYDRAMINE HCL 25 MG PO CAPS: 25.0000 mg | ORAL_CAPSULE | Freq: Once | ORAL | Status: DC

## 2017-11-03 NOTE — Progress Notes (Signed)
Family Meeting Note  Advance Directive:yes  Today a meeting took place with the Patient.  Patient is able to participate   The following clinical team members were present during this meeting:MD  The following were discussed:Patient's diagnosis: Acute recurrent GI bleeding, symptomatic anemia, Patient's progosis: Unable to determine and Goals for treatment: Full Code  Additional follow-up to be provided: prn  Time spent during discussion:20 minutes  [name], MD

## 2017-11-03 NOTE — ED Triage Notes (Signed)
Arrives to ED for probable blood transfusion. States blood work was drawn 2 days ago and PCP called patient today and told her her level 6.6.  Patient is AAOx3.  Skin warm and dry. Skin is pale.  No SOB/ DOE.

## 2017-11-03 NOTE — H&P (Signed)
[facility] [place] NAME: [name]    MR#:  [id number]  DATE OF BIRTH:  02-28-1955  DATE OF ADMISSION:  11/03/2017  PRIMARY CARE PHYSICIAN: Patient, No Pcp Per   REQUESTING/REFERRING PHYSICIAN:   CHIEF COMPLAINT:   Chief Complaint  Patient presents with  . Anemia    HISTORY OF PRESENT ILLNESS: [name]  is a 62 y.o. female with a known history per below which includes recent hospital admission last month for acute upper GI bleeding-status post EGD and colonoscopy noted for hemorrhoids/status post 4 units blood transfusion for symptomatic anemia, patient was sent to the emergency room for hemoglobin of 6.6 noted by her primary care provider 2 days ago, in the emergency room patient was found to have hemoglobin of 7, guaiac positive, patient in no apparent distress, resting comfortably in bed, patient states that she is only on aspirin, patient is now being admitted for acute recurrent blood loss anemia secondary to unknown etiology.  PAST MEDICAL HISTORY:   Past Medical History:  Diagnosis Date  . CHF (congestive heart failure) ([place])   . COPD (chronic obstructive pulmonary disease) ([place])   . Diabetes mellitus without complication ([place])   . NSTEMI (non-ST elevated myocardial infarction) ([place])     PAST SURGICAL HISTORY:  Past Surgical History:  Procedure Laterality Date  . APPENDECTOMY    . CARDIAC CATHETERIZATION    . CHOLECYSTECTOMY    . COLONOSCOPY N/A 09/28/2017   Procedure: COLONOSCOPY;  Surgeon: Toledo, Benay Pike, MD;  Location: [facility] ENDOSCOPY;  Service: Gastroenterology;  Laterality: N/A;  . ESOPHAGOGASTRODUODENOSCOPY N/A 12/19/2016   Procedure: ESOPHAGOGASTRODUODENOSCOPY (EGD);  Surgeon: Lin Landsman, MD;  Location: [facility] ENDOSCOPY;  Service: Gastroenterology;  Laterality: N/A;  . ESOPHAGOGASTRODUODENOSCOPY N/A 09/28/2017   Procedure: ESOPHAGOGASTRODUODENOSCOPY (EGD);  Surgeon: Toledo, Benay Pike, MD;  Location:  [facility] ENDOSCOPY;  Service: Gastroenterology;  Laterality: N/A;  . ESOPHAGOGASTRODUODENOSCOPY (EGD) WITH PROPOFOL N/A 08/08/2017   Procedure: ESOPHAGOGASTRODUODENOSCOPY (EGD) WITH PROPOFOL;  Surgeon: [name], MD;  Location: [facility] ENDOSCOPY;  Service: Endoscopy;  Laterality: N/A;  . LEFT HEART CATH AND CORONARY ANGIOGRAPHY N/A 12/23/2016   Procedure: LEFT HEART CATH AND CORONARY ANGIOGRAPHY;  Surgeon: Teodoro Spray, MD;  Location: [place] CV LAB;  Service: Cardiovascular;  Laterality: N/A;    SOCIAL HISTORY:  Social History   Tobacco Use  . Smoking status: Current Some Day Smoker    Packs/day: 0.25    Types: Cigarettes  . Smokeless tobacco: Never Used  Substance Use Topics  . Alcohol use: No    FAMILY HISTORY:  Family History  Problem Relation Age of Onset  . Hypertension Mother   . Diabetes Mellitus II Mother   . Heart disease Father     DRUG ALLERGIES:  Allergies  Allergen Reactions  . Latex Anaphylaxis and Rash  . Gabapentin Nausea And Vomiting and Other (See Comments)    REVIEW OF SYSTEMS:   CONSTITUTIONAL: No fever, +fatigue, weakness.  EYES: No blurred or double vision.  EARS, NOSE, AND THROAT: No tinnitus or ear pain.  RESPIRATORY: No cough, shortness of breath, wheezing or hemoptysis.  CARDIOVASCULAR: No chest pain, orthopnea, edema.  GASTROINTESTINAL: No nausea, vomiting, diarrhea or abdominal pain.  GENITOURINARY: No dysuria, hematuria.  ENDOCRINE: No polyuria, nocturia,  HEMATOLOGY: No anemia, easy bruising or bleeding SKIN: No rash or lesion. MUSCULOSKELETAL: No joint pain or arthritis.   NEUROLOGIC: No tingling, numbness, weakness.  PSYCHIATRY: No anxiety or depression.   MEDICATIONS AT HOME:  Prior to Admission medications   Medication Sig Start Date End Date Taking? Authorizing Provider  atorvastatin (LIPITOR) 40 MG tablet Take 1 tablet (40 mg total) by mouth daily. 08/29/17 12/27/17  Dustin Flock, MD  blood glucose meter kit and supplies  KIT Dispense based on patient and insurance preference. Use up to four times daily as directed. (FOR ICD-9 250.00, 250.01). 12/24/16   Hillary Bow, MD  carvedilol (COREG) 3.125 MG tablet Take 1 tablet (3.125 mg total) by mouth 2 (two) times daily with a meal. 08/29/17   Dustin Flock, MD  ferrous sulfate 325 (65 FE) MG tablet Take 1 tablet (325 mg total) by mouth 2 (two) times daily with a meal. 09/30/17   Epifanio Lesches, MD  folic acid (FOLVITE) 1 MG tablet Take 1 tablet (1 mg total) by mouth daily. 09/30/17   Epifanio Lesches, MD  furosemide (LASIX) 40 MG tablet Take 1 tablet (40 mg total) by mouth 2 (two) times daily. 08/29/17 08/29/18  Dustin Flock, MD  Insulin Glargine (LANTUS SOLOSTAR) 100 UNIT/ML Solostar Pen Inject 15 Units into the skin daily. 08/29/17   Dustin Flock, MD  Insulin Pen Needle 32G X 5 MM MISC 1 Units by Does not apply route daily. 12/24/16   Hillary Bow, MD  isosorbide mononitrate (IMDUR) 30 MG 24 hr tablet Take 1 tablet (30 mg total) by mouth daily. 08/29/17   Dustin Flock, MD  ondansetron (ZOFRAN) 4 MG tablet Take 1 tablet (4 mg total) by mouth every 6 (six) hours as needed for nausea. 08/29/17   Dustin Flock, MD  pantoprazole (PROTONIX) 40 MG tablet Take 1 tablet (40 mg total) by mouth 2 (two) times daily before a meal. 08/09/17   Shakyla Nolley, Holly Bodily D, MD  pregabalin (LYRICA) 100 MG capsule Take 100 mg by mouth 3 (three) times daily.    [provider]      PHYSICAL EXAMINATION:   VITAL SIGNS: Blood pressure (!) 149/98, pulse 86, temperature 97.8 F (36.6 C), temperature source Oral, resp. rate 17, weight 73.5 kg, SpO2 100 %.  GENERAL:  62 y.o.-year-old patient lying in the bed with no acute distress.  Morbid obesity EYES: Pupils equal, round, reactive to light and accommodation. No scleral icterus. Extraocular muscles intact.  HEENT: Head atraumatic, normocephalic. Oropharynx and nasopharynx clear.  NECK:  Supple, no jugular venous distention.  No thyroid enlargement, no tenderness.  LUNGS: Normal breath sounds bilaterally, no wheezing, rales,rhonchi or crepitation. No use of accessory muscles of respiration.  CARDIOVASCULAR: S1, S2 normal. No murmurs, rubs, or gallops.  ABDOMEN: Soft, nontender, nondistended. Bowel sounds present. No organomegaly or mass.  EXTREMITIES: No pedal edema, cyanosis, or clubbing.  NEUROLOGIC: Cranial nerves II through XII are intact. Muscle strength 5/5 in all extremities. Sensation intact. Gait not checked.  PSYCHIATRIC: The patient is alert and oriented x 3.  SKIN: No obvious rash, lesion, or ulcer.   LABORATORY PANEL:   CBC Recent Labs  Lab 11/03/17 1257  WBC 6.0  HGB 7.0*  HCT 26.1*  PLT 170  MCV 73.9*  MCH 19.8*  MCHC 26.8*  RDW 23.3*  LYMPHSABS 1.4  MONOABS 0.4  EOSABS 0.1  BASOSABS 0.0   ------------------------------------------------------------------------------------------------------------------  Chemistries  Recent Labs  Lab 11/03/17 1257  NA 138  K 4.3  CL 106  CO2 25  GLUCOSE 141*  BUN 21  CREATININE 1.33*  CALCIUM 8.7*   ------------------------------------------------------------------------------------------------------------------ estimated creatinine clearance is 44 mL/min (A) (by C-G formula based on SCr of 1.33 mg/dL (H)). ------------------------------------------------------------------------------------------------------------------ No  results for input(s): TSH, T4TOTAL, T3FREE, THYROIDAB in the last 72 hours.  Invalid input(s): FREET3   Coagulation profile No results for input(s): INR, PROTIME in the last 168 hours. ------------------------------------------------------------------------------------------------------------------- No results for input(s): DDIMER in the last 72 hours. -------------------------------------------------------------------------------------------------------------------  Cardiac Enzymes No results for input(s): CKMB,  TROPONINI, MYOGLOBIN in the last 168 hours.  Invalid input(s): CK ------------------------------------------------------------------------------------------------------------------ Invalid input(s): POCBNP  ---------------------------------------------------------------------------------------------------------------  Urinalysis    Component Value Date/Time   COLORURINE YELLOW (A) 08/26/2017 2053   APPEARANCEUR CLEAR (A) 08/26/2017 2053   APPEARANCEUR Clear 08/03/2013 2138   LABSPEC 1.018 08/26/2017 2053   LABSPEC 1.028 08/03/2013 2138   PHURINE 6.0 08/26/2017 2053   GLUCOSEU NEGATIVE 08/26/2017 2053   GLUCOSEU Negative 08/03/2013 2138   HGBUR NEGATIVE 08/26/2017 2053   [place] NEGATIVE 08/26/2017 2053   BILIRUBINUR Negative 08/03/2013 2138   [place] NEGATIVE 08/26/2017 2053   PROTEINUR 100 (A) 08/26/2017 2053   UROBILINOGEN 0.2 09/27/2006 2132   NITRITE NEGATIVE 08/26/2017 2053   LEUKOCYTESUR NEGATIVE 08/26/2017 2053   LEUKOCYTESUR Negative 08/03/2013 2138     RADIOLOGY: No results found.  EKG: Orders placed or performed during the hospital encounter of 09/26/17  . ED EKG within 10 minutes  . ED EKG within 10 minutes  . EKG 12-Lead  . EKG 12-Lead  . EKG    IMPRESSION AND PLAN: *Acute recurrent GIB Admit to regular nursing for bed, avoid antiplatelet/NSAIDs/anticoagulants, H&H every 6 hours, CBC daily, gastroenterology to see, Protonix daily, transfuse 1 unit packed red blood cells, continue close medical monitoring  Note EGD, colonoscopy done last month by gastroenterology/Dr. Alice Reichert which has shown internal hemorrhoids/5 mm polyp in sigmoid colon which was removed  *Acute recurrent symptomatic anemia Anemia work-up last admission noted for iron deficiency, folate deficiency, and blood loss anemia  Continue iron and folate supplementation   *Chronic diabetes mellitus type 2  Sliding scale insulin with Accu-Cheks per routine   *Chronic diastolic heart  failure without exacerbation Stable Continue beta-blockers, Lasix Most recent echo noted for EF 50 to 55%  *Chronic morbid obesity Most likely secondary to excess calories Lifestyle modification recommended    All the records are reviewed and case discussed with ED provider. Management plans discussed with the patient, family and they are in agreement.  CODE STATUS:full Code Status History    Date Active Date Inactive Code Status Order ID Comments User Context   09/26/2017 2353 09/30/2017 1404 Full Code [id number]  Lance Coon, MD Inpatient   08/27/2017 0347 08/29/2017 2054 Full Code [id number]  Harrie Foreman, MD Inpatient   08/07/2017 1704 08/09/2017 2237 Full Code [id number]  Saundra Shelling, MD Inpatient   12/18/2016 1146 12/26/2016 2032 Full Code [id number]  Vaughan Basta, MD Inpatient   12/18/2016 0959 12/18/2016 1146 Full Code [id number]  Vaughan Basta, MD ED   12/04/2015 0509 12/07/2015 1756 Full Code [id number]  Holley Raring, NP ED   10/28/2015 1640 10/29/2015 1711 Full Code [id number]  Bettey Costa, MD Inpatient   08/29/2015 1438 08/30/2015 1750 Full Code [id number]  Hugelmeyer, Ubaldo Glassing, DO Inpatient       TOTAL TIME TAKING CARE OF THIS PATIENT: 40 minutes.    Avel Peace Edwyna Dangerfield M.D on 11/03/2017   Between 7am to 6pm - Pager - [phone]  After 6pm go to [email or web address] - password [email or web address] [place]  Office  [phone]  CC: Primary care physician; Patient, No Pcp Per   Note: This dictation was prepared with Dragon dictation along with smaller phrase technology.  Any transcriptional errors that result from this process are unintentional.

## 2017-11-03 NOTE — ED Notes (Signed)
IV in [place] noted to be occluded and will not flush. IV in [place] removed, catheter intact. New IV placed in [place] and blood now transfusing through this IV line.

## 2017-11-03 NOTE — ED Provider Notes (Addendum)
[facility] Emergency Department Provider Note  ____________________________________________  Time seen: Approximately 3:31 PM  I have reviewed the triage vital signs and the nursing notes.   HISTORY  Chief Complaint Anemia    HPI [name] is a 62 y.o. female with a history of CHF COPD diabetes and non-STEMI who complains of generalized weakness, worsening over the past month.  Feels too weak to walk more than a few steps at a time.  Denies chest pain or shortness of breath.  No fevers chills or sweats.  Denies belly pain vomiting or diarrhea.  Denies black or bloody stool.  Not on any blood thinners.  Denies any hemorrhage.  Was called by her doctor and told that her hemoglobin was 6.6 and she needed to go to to the emergency room.   Symptoms are constant, gradually worsening.  No alleviating factors.   Past Medical History:  Diagnosis Date  . CHF (congestive heart failure) ([place])   . COPD (chronic obstructive pulmonary disease) ([place])   . Diabetes mellitus without complication ([place])   . NSTEMI (non-ST elevated myocardial infarction) [facility])      Patient Active Problem List   Diagnosis Date Noted  . COPD (chronic obstructive pulmonary disease) ([place]) 09/26/2017  . Symptomatic anemia 09/26/2017  . Acute on chronic systolic CHF (congestive heart failure) ([place]) 08/27/2017  . Anemia   . Weakness   . GI bleed 08/07/2017  . Chronic systolic heart failure ([place]) 12/28/2016  . Diabetes ([place]) 12/28/2016  . Tobacco use 12/28/2016  . NSTEMI (non-ST elevated myocardial infarction) ([place])   . Sepsis ([place]) 12/18/2016  . CHF exacerbation ([place]) 12/04/2015  . PNA (pneumonia) 10/28/2015  . COPD exacerbation ([place]) 08/29/2015     Past Surgical History:  Procedure Laterality Date  . APPENDECTOMY    . CARDIAC CATHETERIZATION    . CHOLECYSTECTOMY    . COLONOSCOPY N/A 09/28/2017   Procedure: COLONOSCOPY;  Surgeon: Toledo, Benay Pike, MD;  Location: [facility]  ENDOSCOPY;  Service: Gastroenterology;  Laterality: N/A;  . ESOPHAGOGASTRODUODENOSCOPY N/A 12/19/2016   Procedure: ESOPHAGOGASTRODUODENOSCOPY (EGD);  Surgeon: Lin Landsman, MD;  Location: [facility] ENDOSCOPY;  Service: Gastroenterology;  Laterality: N/A;  . ESOPHAGOGASTRODUODENOSCOPY N/A 09/28/2017   Procedure: ESOPHAGOGASTRODUODENOSCOPY (EGD);  Surgeon: Toledo, Benay Pike, MD;  Location: [facility] ENDOSCOPY;  Service: Gastroenterology;  Laterality: N/A;  . ESOPHAGOGASTRODUODENOSCOPY (EGD) WITH PROPOFOL N/A 08/08/2017   Procedure: ESOPHAGOGASTRODUODENOSCOPY (EGD) WITH PROPOFOL;  Surgeon: [name], MD;  Location: [facility] ENDOSCOPY;  Service: Endoscopy;  Laterality: N/A;  . LEFT HEART CATH AND CORONARY ANGIOGRAPHY N/A 12/23/2016   Procedure: LEFT HEART CATH AND CORONARY ANGIOGRAPHY;  Surgeon: Teodoro Spray, MD;  Location: [place] CV LAB;  Service: Cardiovascular;  Laterality: N/A;     Prior to Admission medications   Medication Sig Start Date End Date Taking? Authorizing Provider  atorvastatin (LIPITOR) 40 MG tablet Take 1 tablet (40 mg total) by mouth daily. 08/29/17 12/27/17  Dustin Flock, MD  blood glucose meter kit and supplies KIT Dispense based on patient and insurance preference. Use up to four times daily as directed. (FOR ICD-9 250.00, 250.01). 12/24/16   Hillary Bow, MD  carvedilol (COREG) 3.125 MG tablet Take 1 tablet (3.125 mg total) by mouth 2 (two) times daily with a meal. 08/29/17   Dustin Flock, MD  ferrous sulfate 325 (65 FE) MG tablet Take 1 tablet (325 mg total) by mouth 2 (two) times daily with a meal. 09/30/17   Epifanio Lesches, MD  folic acid (FOLVITE) 1  MG tablet Take 1 tablet (1 mg total) by mouth daily. 09/30/17   Epifanio Lesches, MD  furosemide (LASIX) 40 MG tablet Take 1 tablet (40 mg total) by mouth 2 (two) times daily. 08/29/17 08/29/18  Dustin Flock, MD  Insulin Glargine (LANTUS SOLOSTAR) 100 UNIT/ML Solostar Pen Inject 15 Units into the skin daily.  08/29/17   Dustin Flock, MD  Insulin Pen Needle 32G X 5 MM MISC 1 Units by Does not apply route daily. 12/24/16   Hillary Bow, MD  isosorbide mononitrate (IMDUR) 30 MG 24 hr tablet Take 1 tablet (30 mg total) by mouth daily. 08/29/17   Dustin Flock, MD  ondansetron (ZOFRAN) 4 MG tablet Take 1 tablet (4 mg total) by mouth every 6 (six) hours as needed for nausea. 08/29/17   Dustin Flock, MD  pantoprazole (PROTONIX) 40 MG tablet Take 1 tablet (40 mg total) by mouth 2 (two) times daily before a meal. 08/09/17   Salary, Holly Bodily D, MD  pregabalin (LYRICA) 100 MG capsule Take 100 mg by mouth 3 (three) times daily.    [provider]     Allergies Latex and Gabapentin   Family History  Problem Relation Age of Onset  . Hypertension Mother   . Diabetes Mellitus II Mother   . Heart disease Father     Social History Social History   Tobacco Use  . Smoking status: Current Some Day Smoker    Packs/day: 0.25    Types: Cigarettes  . Smokeless tobacco: Never Used  Substance Use Topics  . Alcohol use: No  . Drug use: No    Review of Systems  Constitutional:   No fever or chills.  ENT:   No sore throat. No rhinorrhea. Cardiovascular:   No chest pain or syncope. Respiratory:   No dyspnea or cough. Gastrointestinal:   Negative for abdominal pain, vomiting and diarrhea.  Musculoskeletal:   Negative for focal pain or swelling All other systems reviewed and are negative except as documented above in ROS and HPI.  ____________________________________________   PHYSICAL EXAM:  VITAL SIGNS: ED Triage Vitals  Enc Vitals Group     BP 11/03/17 1255 (!) 144/70     Pulse Rate 11/03/17 1255 84     Resp 11/03/17 1255 17     Temp 11/03/17 1255 97.8 F (36.6 C)     Temp Source 11/03/17 1255 Oral     SpO2 11/03/17 1255 100 %     Weight 11/03/17 1131 162 lb 0.6 oz (73.5 kg)     Height --      Head Circumference --      Peak Flow --      Pain Score 11/03/17 1131 0     Pain  Loc --      Pain Edu? --      Excl. in [place]? --     Vital signs reviewed, nursing assessments reviewed.   Constitutional:   Alert and oriented. Non-toxic appearance. Eyes:   Conjunctivae are normal. EOMI. PERRL. ENT      Head:   Normocephalic and atraumatic.      Nose:   No congestion/rhinnorhea.       Mouth/Throat:   MMM, no pharyngeal erythema. No peritonsillar mass.       Neck:   No meningismus. Full ROM. Hematological/Lymphatic/Immunilogical:   No cervical lymphadenopathy. Cardiovascular:   RRR. Symmetric bilateral radial and DP pulses.  No murmurs. Cap refill less than 2 seconds. Respiratory:   Normal respiratory effort without tachypnea/retractions. Breath  sounds are clear and equal bilaterally. No wheezes/rales/rhonchi. Gastrointestinal:   Soft and nontender. Non distended. There is no CVA tenderness.  No rebound, rigidity, or guarding.  Full exam with brown stool, Hemoccult positive Musculoskeletal:   Normal range of motion in all extremities. No joint effusions.  No lower extremity tenderness.  No edema. Neurologic:   Normal speech and language.  Motor grossly intact. No acute focal neurologic deficits are appreciated.  Skin:    Skin is warm, dry and intact. No rash noted.  No petechiae, purpura, or bullae.  ____________________________________________    LABS (pertinent positives/negatives) (all labs ordered are listed, but only abnormal results are displayed) Labs Reviewed  CBC WITH DIFFERENTIAL/PLATELET - Abnormal; Notable for the following components:      Result Value   RBC 3.53 (*)    Hemoglobin 7.0 (*)    HCT 26.1 (*)    MCV 73.9 (*)    MCH 19.8 (*)    MCHC 26.8 (*)    RDW 23.3 (*)    All other components within normal limits  BASIC METABOLIC PANEL - Abnormal; Notable for the following components:   Glucose, Bld 141 (*)    Creatinine, Ser 1.33 (*)    Calcium 8.7 (*)    GFR calc non Af Amer 42 (*)    GFR calc Af Amer 49 (*)    All other components within  normal limits  TYPE AND SCREEN  PREPARE RBC (CROSSMATCH)   ____________________________________________   EKG    ____________________________________________    RADIOLOGY  No results found.  ____________________________________________   PROCEDURES .Critical Care Performed by: Carrie Mew, MD Authorized by: Carrie Mew, MD   Critical care provider statement:    Critical care time (minutes):  35   Critical care time was exclusive of:  Separately billable procedures and treating other patients   Critical care was necessary to treat or prevent imminent or life-threatening deterioration of the following conditions:  Circulatory failure   Critical care was time spent personally by me on the following activities:  Development of treatment plan with patient or surrogate, discussions with consultants, evaluation of patient's response to treatment, examination of patient, obtaining history from patient or surrogate, ordering and performing treatments and interventions, ordering and review of laboratory studies, ordering and review of radiographic studies, pulse oximetry, re-evaluation of patient's condition and review of old charts    ____________________________________________    CLINICAL IMPRESSION / [place] / ED COURSE  Pertinent labs & imaging results that were available during my care of the patient were reviewed by me and considered in my medical decision making (see chart for details).    Patient presents with symptomatic anemia, hemoglobin of 7.0 on her labs today.  Exam does reveal an occult GI bleed.  Will transfuse 1 unit after obtaining verbal consent from the patient.  Plan to admit for further hemodynamic management and GI evaluation.      ____________________________________________   FINAL CLINICAL IMPRESSION(S) / ED DIAGNOSES    Final diagnoses:  Occult GI bleeding  Symptomatic anemia     ED Discharge Orders    None       Portions of this note were generated with dragon dictation software. Dictation errors may occur despite best attempts at proofreading.    Carrie Mew, MD 11/03/17 [place]    Carrie Mew, MD 12/12/17 1525

## 2017-11-04 DIAGNOSIS — D509 Iron deficiency anemia, unspecified: Secondary | ICD-10-CM

## 2017-11-04 DIAGNOSIS — D500 Iron deficiency anemia secondary to blood loss (chronic): Secondary | ICD-10-CM | POA: Diagnosis not present

## 2017-11-04 LAB — BPAM RBC
Blood Product Expiration Date: 201911052359
ISSUE DATE / TIME: 201910181603
Unit Type and Rh: 5100

## 2017-11-04 LAB — TYPE AND SCREEN
ABO/RH(D): O POS
Antibody Screen: NEGATIVE
Unit division: 0

## 2017-11-04 LAB — HEMOGLOBIN AND HEMATOCRIT, BLOOD
HCT: 26.4 % — ABNORMAL LOW (ref 36.0–46.0)
HEMATOCRIT: 26.6 % — AB (ref 36.0–46.0)
HEMOGLOBIN: 7.5 g/dL — AB (ref 12.0–15.0)
Hemoglobin: 7.6 g/dL — ABNORMAL LOW (ref 12.0–15.0)

## 2017-11-04 LAB — GLUCOSE, CAPILLARY
GLUCOSE-CAPILLARY: 108 mg/dL — AB (ref 70–99)
GLUCOSE-CAPILLARY: 88 mg/dL (ref 70–99)
GLUCOSE-CAPILLARY: 74 mg/dL (ref 70–99)
Glucose-Capillary: 89 mg/dL (ref 70–99)

## 2017-11-04 SURGERY — IMAGING PROCEDURE, GI TRACT, INTRALUMINAL, VIA CAPSULE

## 2017-11-04 MED ORDER — IPRATROPIUM-ALBUTEROL 0.5-2.5 (3) MG/3ML IN SOLN
3.0000 mL | Freq: Two times a day (BID) | RESPIRATORY_TRACT | Status: DC
  Administered 2017-11-04: 3 mL via RESPIRATORY_TRACT
  Filled 2017-11-04: qty 3

## 2017-11-04 MED ORDER — SODIUM CHLORIDE 0.9 % IV SOLN: INTRAVENOUS | Status: DC

## 2017-11-04 MED ORDER — DIPHENHYDRAMINE HCL 25 MG PO CAPS
25.0000 mg | ORAL_CAPSULE | Freq: Four times a day (QID) | ORAL | Status: DC | PRN
  Administered 2017-11-04 – 2017-11-05 (×2): 25 mg via ORAL
  Filled 2017-11-04 (×2): qty 1

## 2017-11-04 NOTE — Progress Notes (Addendum)
Tanya Antigua, MD [address] [place], [place], [place], [address] [id number], [place], [place], [place], [address] Phone: [phone]  Fax: [phone]  Consultation  Referring Provider:     Dr. Posey Pronto Primary Care Physician:  Patient, No Pcp Per Reason for Consultation:     Anemia  Date of Admission:  11/03/2017 Date of Consultation:  11/04/2017         HPI:   [name] is a 62 y.o. female with history of multiple EGDs and colonoscopies in the past, with recent GI work-up by Dr. Alice Reichert, who is undergoing small bowel capsule endoscopy today as ordered by Dr. Allen Norris, due to anemia.  Patient with history of iron deficiency anemia on previous admissions, readmitted due to anemia with hemoglobin of 6.6 at primary care office 2 days ago, and hemoglobin of 7 on ER admission.  Patient denies any signs of bleeding at home.  No melena or hematochezia or hematemesis.  No nausea vomiting.  No abdominal pain.  As per consult note by Dr. Ricky Stabs.  In September 2019, patient underwent cardiac cath in December 2018, showing severe stenosis and CABG was recommended but was not done.  "She was admitted to hospital in July and underwent EGD while in the hospital by Dr. Allen Norris on 07/23 which showed gastritis. EGD while admitted in December performed 12/19/2016..showed superficial gastric erosions from NG trauma with adherent blood clot, treated with heater probe..No colonoscopy was performed."  Last EGD September 2019 by Dr. Alice Reichert, normal exam.  Colonoscopy September 2019 by Dr. Alice Reichert, internal hemorrhoids, 5 mm sigmoid polyp removed.  Past Medical History:  Diagnosis Date  . CHF (congestive heart failure) ([place])   . COPD (chronic obstructive pulmonary disease) ([place])   . Diabetes mellitus without complication ([place])   . NSTEMI (non-ST elevated myocardial infarction) [facility])     Past Surgical History:  Procedure Laterality Date  . APPENDECTOMY    . CARDIAC CATHETERIZATION    .  CHOLECYSTECTOMY    . COLONOSCOPY N/A 09/28/2017   Procedure: COLONOSCOPY;  Surgeon: Toledo, Benay Pike, MD;  Location: [facility] ENDOSCOPY;  Service: Gastroenterology;  Laterality: N/A;  . ESOPHAGOGASTRODUODENOSCOPY N/A 12/19/2016   Procedure: ESOPHAGOGASTRODUODENOSCOPY (EGD);  Surgeon: Lin Landsman, MD;  Location: [facility] ENDOSCOPY;  Service: Gastroenterology;  Laterality: N/A;  . ESOPHAGOGASTRODUODENOSCOPY N/A 09/28/2017   Procedure: ESOPHAGOGASTRODUODENOSCOPY (EGD);  Surgeon: Toledo, Benay Pike, MD;  Location: [facility] ENDOSCOPY;  Service: Gastroenterology;  Laterality: N/A;  . ESOPHAGOGASTRODUODENOSCOPY (EGD) WITH PROPOFOL N/A 08/08/2017   Procedure: ESOPHAGOGASTRODUODENOSCOPY (EGD) WITH PROPOFOL;  Surgeon: [name], MD;  Location: [facility] ENDOSCOPY;  Service: Endoscopy;  Laterality: N/A;  . LEFT HEART CATH AND CORONARY ANGIOGRAPHY N/A 12/23/2016   Procedure: LEFT HEART CATH AND CORONARY ANGIOGRAPHY;  Surgeon: Teodoro Spray, MD;  Location: [facility] LAB;  Service: Cardiovascular;  Laterality: N/A;    Prior to Admission medications   Medication Sig Start Date End Date Taking? Authorizing Provider  atorvastatin (LIPITOR) 40 MG tablet Take 1 tablet (40 mg total) by mouth daily. 08/29/17 12/27/17 Yes Dustin Flock, MD  blood glucose meter kit and supplies KIT Dispense based on patient and insurance preference. Use up to four times daily as directed. (FOR ICD-9 250.00, 250.01). 12/24/16  Yes Hillary Bow, MD  carvedilol (COREG) 3.125 MG tablet Take 1 tablet (3.125 mg total) by mouth 2 (two) times daily with a meal. 08/29/17  Yes Dustin Flock, MD  ferrous sulfate 325 (65 FE) MG tablet Take 1 tablet (325 mg total)  by mouth 2 (two) times daily with a meal. 09/30/17  Yes Epifanio Lesches, MD  folic acid (FOLVITE) 1 MG tablet Take 1 tablet (1 mg total) by mouth daily. 09/30/17  Yes Epifanio Lesches, MD  furosemide (LASIX) 40 MG tablet Take 1 tablet (40 mg total) by mouth 2 (two) times daily.  08/29/17 08/29/18 Yes Dustin Flock, MD  Insulin Glargine (LANTUS SOLOSTAR) 100 UNIT/ML Solostar Pen Inject 15 Units into the skin daily. Patient taking differently: Inject 16 Units into the skin daily.  08/29/17  Yes Dustin Flock, MD  Insulin Pen Needle 32G X 5 MM MISC 1 Units by Does not apply route daily. 12/24/16  Yes Sudini, Alveta Heimlich, MD  ondansetron (ZOFRAN) 4 MG tablet Take 1 tablet (4 mg total) by mouth every 6 (six) hours as needed for nausea. 08/29/17  Yes Dustin Flock, MD  pregabalin (LYRICA) 100 MG capsule Take 100-200 mg by mouth See admin instructions. Take 1 capsule (100MG) by mouth every morning and 2 capsules (200MG) by mouth every night   Yes [provider]  PROAIR HFA 108 (90 Base) MCG/ACT inhaler Inhale 1-2 puffs into the lungs every 4 (four) hours as needed. 10/10/17  Yes [provider]  SYMBICORT 160-4.5 MCG/ACT inhaler Inhale 2 puffs into the lungs 2 (two) times daily. 10/10/17  Yes [provider]    Family History  Problem Relation Age of Onset  . Hypertension Mother   . Diabetes Mellitus II Mother   . Heart disease Father      Social History   Tobacco Use  . Smoking status: Current Some Day Smoker    Packs/day: 0.25    Types: Cigarettes  . Smokeless tobacco: Never Used  Substance Use Topics  . Alcohol use: No  . Drug use: No    Allergies as of 11/03/2017 - Review Complete 11/03/2017  Allergen Reaction Noted  . Latex Anaphylaxis and Rash 11/07/2012  . Gabapentin Nausea And Vomiting and Other (See Comments) 12/20/2012    Review of Systems:    All systems reviewed and negative except where noted in HPI.   Physical Exam:  Vital signs in last 24 hours: Vitals:   11/03/17 2000 11/03/17 2014 11/04/17 0206 11/04/17 0507  BP: (!) 157/84   128/77  Pulse: 87   80  Resp: 20   17  Temp: 98 F (36.7 C)   97.9 F (36.6 C)  TempSrc: Oral   Oral  SpO2: 97% 98% 98% 96%  Weight:       Last BM Date: 11/03/17 General:   Pleasant,  cooperative in NAD Head:  Normocephalic and atraumatic. Eyes:   No icterus.   Conjunctiva pink. PERRLA. Ears:  Normal auditory acuity. Neck:  Supple; no masses or thyroidomegaly Lungs: Respirations even and unlabored. Lungs clear to auscultation bilaterally.   No wheezes, crackles, or rhonchi.  Abdomen:  Soft, nondistended, nontender. Normal bowel sounds. No appreciable masses or hepatomegaly.  No rebound or guarding.  Neurologic:  Alert and oriented x3;  grossly normal neurologically. Skin:  Intact without significant lesions or rashes. Cervical Nodes:  No significant cervical adenopathy. Psych:  Alert and cooperative. Normal affect.  LAB RESULTS: Recent Labs    11/03/17 1257 11/03/17 2130 11/04/17 0949  WBC 6.0 7.1  --   HGB 7.0* 7.4*  7.3* 7.6*  HCT 26.1* 25.7*  25.4* 26.6*  PLT 170 146*  --    BMET Recent Labs    11/03/17 1257  NA 138  K 4.3  CL 106  CO2 25  GLUCOSE 141*  BUN 21  CREATININE 1.33*  CALCIUM 8.7*   LFT No results for input(s): PROT, ALBUMIN, AST, ALT, ALKPHOS, BILITOT, BILIDIR, IBILI in the last 72 hours. PT/INR No results for input(s): LABPROT, INR in the last 72 hours.  STUDIES: No results found.    Impression / Plan:   [name] is a 62 y.o. y/o female with anemia, with extensive GI work-up for the same recently, with last EGD and colonoscopy in September 2019 unrevealing of source, schedule for small bowel capsule endoscopy today  PPI IV twice daily  Continue serial CBCs and transfuse PRN Avoid NSAIDs Maintain 2 large-bore IV lines Please page GI with any acute hemodynamic changes, or signs of active GI bleeding  capsule study will be read tomorrow and findings updated.  small bowel capsule study discussed with patient, including risk of capsule getting stuck, obstruction or perforation.  Benefits of surgery discussed as well.  Patient understood these risks and benefits and signed consent for capsule study and proceeded with  study.  If patient has signs of active GI bleeding, primary team to please order RBC scan or CTA depending on rate of bleeding to evaluate for small bowel source of GI bleeding No signs of acute GI bleeding at this time    Thank you for involving me in the care of this patient.      LOS: 0 days   Virgel Manifold, MD  11/04/2017, 10:11 AM

## 2017-11-04 NOTE — Progress Notes (Signed)
[place] at [facility]                                                                                                                                                                                  Patient Demographics   [name], is a 62 y.o. female, DOB - [birth year], GUY:[id number]  Admit date - 11/03/2017   Admitting Physician [name], MD  Outpatient Primary MD for the patient is Patient, No Pcp Per   LOS - 0  Subjective: Patient denying any complaints has not noticed any blood    Review of Systems:   CONSTITUTIONAL: No documented fever. No fatigue, weakness. No weight gain, no weight loss.  EYES: No blurry or double vision.  ENT: No tinnitus. No postnasal drip. No redness of the oropharynx.  RESPIRATORY: No cough, no wheeze, no hemoptysis. No dyspnea.  CARDIOVASCULAR: No chest pain. No orthopnea. No palpitations. No syncope.  GASTROINTESTINAL: No nausea, no vomiting or diarrhea. No abdominal pain.  Positive melena or hematochezia.  GENITOURINARY: No dysuria or hematuria.  ENDOCRINE: No polyuria or nocturia. No heat or cold intolerance.  HEMATOLOGY: No anemia. No bruising. No bleeding.  INTEGUMENTARY: No rashes. No lesions.  MUSCULOSKELETAL: No arthritis. No swelling. No gout.  NEUROLOGIC: No numbness, tingling, or ataxia. No seizure-type activity.  PSYCHIATRIC: No anxiety. No insomnia. No ADD.    Vitals:   Vitals:   11/03/17 2014 11/04/17 0206 11/04/17 0507 11/04/17 1343  BP:   128/77 107/68  Pulse:   80 69  Resp:   17 20  Temp:   97.9 F (36.6 C) 98.1 F (36.7 C)  TempSrc:   Oral Oral  SpO2: 98% 98% 96% 100%  Weight:        Wt Readings from Last 3 Encounters:  11/03/17 73.5 kg  09/26/17 73.5 kg  08/29/17 75.9 kg     Intake/Output Summary (Last 24 hours) at 11/04/2017 1503 Last data filed at 11/03/2017 2242 Gross per 24 hour  Intake 656.67 ml  Output -  Net 656.67 ml    Physical Exam:   GENERAL:  Pleasant-appearing in no apparent distress.  HEAD, EYES, EARS, NOSE AND THROAT: Atraumatic, normocephalic. Extraocular muscles are intact. Pupils equal and reactive to light. Sclerae anicteric. No conjunctival injection. No oro-pharyngeal erythema.  NECK: Supple. There is no jugular venous distention. No bruits, no lymphadenopathy, no thyromegaly.  HEART: Regular rate and rhythm,. No murmurs, no rubs, no clicks.  LUNGS: Clear to auscultation bilaterally. No rales or rhonchi. No wheezes.  ABDOMEN: Soft, flat, nontender, nondistended. Has good bowel sounds. No hepatosplenomegaly appreciated.  EXTREMITIES: No evidence of any cyanosis, clubbing, or peripheral edema.  +2  pedal and radial pulses bilaterally.  NEUROLOGIC: The patient is alert, awake, and oriented x3 with no focal motor or sensory deficits appreciated bilaterally.  SKIN: Moist and warm with no rashes appreciated.  Psych: Not anxious, depressed LN: No inguinal LN enlargement    Antibiotics   Anti-infectives (From admission, onward)   None      Medications   Scheduled Meds: . atorvastatin  40 mg Oral Daily  . carvedilol  3.125 mg Oral BID WC  . ferrous sulfate  325 mg Oral BID WC  . folic acid  1 mg Oral Daily  . furosemide  40 mg Oral BID  . insulin aspart  0-15 Units Subcutaneous TID WC  . insulin aspart  0-5 Units Subcutaneous QHS  . insulin glargine  10 Units Subcutaneous Daily  . ipratropium-albuterol  3 mL Nebulization BID  . pantoprazole  40 mg Intravenous Q12H  . pregabalin  100 mg Oral TID   Continuous Infusions: . famotidine (PEPCID) IV 20 mg (11/04/17 1205)   PRN Meds:.acetaminophen **OR** acetaminophen, HYDROcodone-acetaminophen, ondansetron **OR** ondansetron (ZOFRAN) IV, polyethylene glycol   Data Review:   Micro Results No results found for this or any previous visit (from the past 240 hour(s)).  Radiology Reports No results found.   CBC Recent Labs  Lab 11/03/17 1257 11/03/17 2130  11/04/17 0949  WBC 6.0 7.1  --   HGB 7.0* 7.4*  7.3* 7.6*  HCT 26.1* 25.7*  25.4* 26.6*  PLT 170 146*  --   MCV 73.9* 74.9*  --   MCH 19.8* 21.6*  --   MCHC 26.8* 28.8*  --   RDW 23.3* 22.5*  --   LYMPHSABS 1.4  --   --   MONOABS 0.4  --   --   EOSABS 0.1  --   --   BASOSABS 0.0  --   --     Chemistries  Recent Labs  Lab 11/03/17 1257  NA 138  K 4.3  CL 106  CO2 25  GLUCOSE 141*  BUN 21  CREATININE 1.33*  CALCIUM 8.7*   ------------------------------------------------------------------------------------------------------------------ estimated creatinine clearance is 44 mL/min (A) (by C-G formula based on SCr of 1.33 mg/dL (H)). ------------------------------------------------------------------------------------------------------------------ No results for input(s): HGBA1C in the last 72 hours. ------------------------------------------------------------------------------------------------------------------ No results for input(s): CHOL, HDL, LDLCALC, TRIG, CHOLHDL, LDLDIRECT in the last 72 hours. ------------------------------------------------------------------------------------------------------------------ No results for input(s): TSH, T4TOTAL, T3FREE, THYROIDAB in the last 72 hours.  Invalid input(s): FREET3 ------------------------------------------------------------------------------------------------------------------ No results for input(s): VITAMINB12, FOLATE, FERRITIN, TIBC, IRON, RETICCTPCT in the last 72 hours.  Coagulation profile No results for input(s): INR, PROTIME in the last 168 hours.  No results for input(s): DDIMER in the last 72 hours.  Cardiac Enzymes No results for input(s): CKMB, TROPONINI, MYOGLOBIN in the last 168 hours.  Invalid input(s): CK ------------------------------------------------------------------------------------------------------------------ Invalid input(s): POCBNP    Assessment & Plan   *Acute recurrent GIB Status  post 1 unit of packed RBCs Previous work-up with EGD colonoscopy negative Patient had a capsule endoscopy study results currently pending Follow CBC  *Acute recurrent symptomatic anemia Anemia work-up last admission noted for iron deficiency, folate deficiency, and blood loss anemia  Continue iron and folate supplementation   *Chronic diabetes mellitus type 2  Sliding scale insulin with Accu-Cheks per routine   *Chronic diastolic heart failure without exacerbation Stable Continue beta-blockers, Lasix Most recent echo noted for EF 50 to 55%  *Chronic morbid obesity Most likely secondary to excess calories Lifestyle modification recommended  *Severe mitral regurg patient  states that she was seen by her cardiologist      Code Status Orders  (From admission, onward)         Start     Ordered   11/03/17 1758  Full code  Continuous     11/03/17 1757        Code Status History    Date Active Date Inactive Code Status Order ID Comments User Context   09/26/2017 2353 09/30/2017 1404 Full Code [id number]  Lance Coon, MD Inpatient   08/27/2017 0347 08/29/2017 2054 Full Code [id number]  Harrie Foreman, MD Inpatient   08/07/2017 1704 08/09/2017 2237 Full Code [id number]  Saundra Shelling, MD Inpatient   12/18/2016 1146 12/26/2016 2032 Full Code [id number]  Vaughan Basta, MD Inpatient   12/18/2016 0959 12/18/2016 1146 Full Code [id number]  Vaughan Basta, MD ED   12/04/2015 0509 12/07/2015 1756 Full Code [id number]  Holley Raring, NP ED   10/28/2015 1640 10/29/2015 1711 Full Code [id number]  Bettey Costa, MD Inpatient   08/29/2015 1438 08/30/2015 1750 Full Code [id number]  Hugelmeyer, Ubaldo Glassing, DO Inpatient           Consults gastroenterology   DVT Prophylaxis SCDs Lab Results  Component Value Date   PLT 146 (L) 11/03/2017     Time Spent in minutes   79mn  Greater than 50% of time spent in care coordination and counseling patient regarding the condition  and plan of care.   SDustin FlockM.D on 11/04/2017 at 3:03 PM  Between 7am to 6pm - Pager - [phone]  After 6pm go to [email or web address] - [employer]   Office  [phone]

## 2017-11-05 DIAGNOSIS — E119 Type 2 diabetes mellitus without complications: Secondary | ICD-10-CM | POA: Diagnosis present

## 2017-11-05 DIAGNOSIS — Z833 Family history of diabetes mellitus: Secondary | ICD-10-CM | POA: Diagnosis not present

## 2017-11-05 DIAGNOSIS — K31811 Angiodysplasia of stomach and duodenum with bleeding: Secondary | ICD-10-CM | POA: Diagnosis present

## 2017-11-05 DIAGNOSIS — Z9109 Other allergy status, other than to drugs and biological substances: Secondary | ICD-10-CM | POA: Diagnosis not present

## 2017-11-05 DIAGNOSIS — E538 Deficiency of other specified B group vitamins: Secondary | ICD-10-CM | POA: Diagnosis present

## 2017-11-05 DIAGNOSIS — Z9049 Acquired absence of other specified parts of digestive tract: Secondary | ICD-10-CM | POA: Diagnosis not present

## 2017-11-05 DIAGNOSIS — K5520 Angiodysplasia of colon without hemorrhage: Secondary | ICD-10-CM | POA: Diagnosis not present

## 2017-11-05 DIAGNOSIS — I252 Old myocardial infarction: Secondary | ICD-10-CM | POA: Diagnosis not present

## 2017-11-05 DIAGNOSIS — R195 Other fecal abnormalities: Secondary | ICD-10-CM | POA: Diagnosis present

## 2017-11-05 DIAGNOSIS — I5032 Chronic diastolic (congestive) heart failure: Secondary | ICD-10-CM | POA: Diagnosis present

## 2017-11-05 DIAGNOSIS — I340 Nonrheumatic mitral (valve) insufficiency: Secondary | ICD-10-CM | POA: Diagnosis present

## 2017-11-05 DIAGNOSIS — F17210 Nicotine dependence, cigarettes, uncomplicated: Secondary | ICD-10-CM | POA: Diagnosis present

## 2017-11-05 DIAGNOSIS — D500 Iron deficiency anemia secondary to blood loss (chronic): Secondary | ICD-10-CM | POA: Diagnosis present

## 2017-11-05 DIAGNOSIS — K5521 Angiodysplasia of colon with hemorrhage: Secondary | ICD-10-CM | POA: Diagnosis present

## 2017-11-05 DIAGNOSIS — J449 Chronic obstructive pulmonary disease, unspecified: Secondary | ICD-10-CM | POA: Diagnosis present

## 2017-11-05 DIAGNOSIS — Z794 Long term (current) use of insulin: Secondary | ICD-10-CM | POA: Diagnosis not present

## 2017-11-05 DIAGNOSIS — Z91040 Latex allergy status: Secondary | ICD-10-CM | POA: Diagnosis not present

## 2017-11-05 DIAGNOSIS — Z8249 Family history of ischemic heart disease and other diseases of the circulatory system: Secondary | ICD-10-CM | POA: Diagnosis not present

## 2017-11-05 LAB — GLUCOSE, CAPILLARY
GLUCOSE-CAPILLARY: 101 mg/dL — AB (ref 70–99)
GLUCOSE-CAPILLARY: 76 mg/dL (ref 70–99)
GLUCOSE-CAPILLARY: 95 mg/dL (ref 70–99)
Glucose-Capillary: 121 mg/dL — ABNORMAL HIGH (ref 70–99)
Glucose-Capillary: 62 mg/dL — ABNORMAL LOW (ref 70–99)

## 2017-11-05 LAB — CBC
HCT: 26.5 % — ABNORMAL LOW (ref 36.0–46.0)
Hemoglobin: 7.6 g/dL — ABNORMAL LOW (ref 12.0–15.0)
MCH: 21.5 pg — AB (ref 26.0–34.0)
MCHC: 28.7 g/dL — AB (ref 30.0–36.0)
MCV: 75.1 fL — ABNORMAL LOW (ref 80.0–100.0)
PLATELETS: 151 10*3/uL (ref 150–400)
RBC: 3.53 MIL/uL — AB (ref 3.87–5.11)
RDW: 22.8 % — ABNORMAL HIGH (ref 11.5–15.5)
WBC: 5.9 10*3/uL (ref 4.0–10.5)
nRBC: 0 % (ref 0.0–0.2)

## 2017-11-05 MED ORDER — IPRATROPIUM-ALBUTEROL 0.5-2.5 (3) MG/3ML IN SOLN: 3.0000 mL | Freq: Four times a day (QID) | RESPIRATORY_TRACT | Status: DC | PRN

## 2017-11-05 NOTE — Progress Notes (Signed)
BP 106/54. MD notified. Lasix 24m PO held

## 2017-11-05 NOTE — Plan of Care (Signed)

## 2017-11-05 NOTE — Progress Notes (Signed)
[place] at [facility]                                                                                                                                                                                  Patient Demographics   [name], is a 62 y.o. female, DOB - 15-Oct-1955, NOB:[id number]  Admit date - 11/03/2017   Admitting Physician [name], MD  Outpatient Primary MD for the patient is Patient, No Pcp Per   LOS - 0  Subjective: No further bleeding noted    Review of Systems:   CONSTITUTIONAL: No documented fever. No fatigue, weakness. No weight gain, no weight loss.  EYES: No blurry or double vision.  ENT: No tinnitus. No postnasal drip. No redness of the oropharynx.  RESPIRATORY: No cough, no wheeze, no hemoptysis. No dyspnea.  CARDIOVASCULAR: No chest pain. No orthopnea. No palpitations. No syncope.  GASTROINTESTINAL: No nausea, no vomiting or diarrhea. No abdominal pain.  Positive melena or hematochezia.  GENITOURINARY: No dysuria or hematuria.  ENDOCRINE: No polyuria or nocturia. No heat or cold intolerance.  HEMATOLOGY: No anemia. No bruising. No bleeding.  INTEGUMENTARY: No rashes. No lesions.  MUSCULOSKELETAL: No arthritis. No swelling. No gout.  NEUROLOGIC: No numbness, tingling, or ataxia. No seizure-type activity.  PSYCHIATRIC: No anxiety. No insomnia. No ADD.    Vitals:   Vitals:   11/04/17 2001 11/04/17 2215 11/05/17 0549 11/05/17 0826  BP:  104/63 121/74 (!) 106/54  Pulse:  69 72 72  Resp:    16  Temp:   98.3 F (36.8 C) 97.8 F (36.6 C)  TempSrc:   Oral Oral  SpO2: 97% 96% 99% 96%  Weight:        Wt Readings from Last 3 Encounters:  11/03/17 73.5 kg  09/26/17 73.5 kg  08/29/17 75.9 kg     Intake/Output Summary (Last 24 hours) at 11/05/2017 1340 Last data filed at 11/05/2017 1127 Gross per 24 hour  Intake 678.72 ml  Output -  Net 678.72 ml    Physical Exam:   GENERAL: Pleasant-appearing in no  apparent distress.  HEAD, EYES, EARS, NOSE AND THROAT: Atraumatic, normocephalic. Extraocular muscles are intact. Pupils equal and reactive to light. Sclerae anicteric. No conjunctival injection. No oro-pharyngeal erythema.  NECK: Supple. There is no jugular venous distention. No bruits, no lymphadenopathy, no thyromegaly.  HEART: Regular rate and rhythm,. No murmurs, no rubs, no clicks.  LUNGS: Clear to auscultation bilaterally. No rales or rhonchi. No wheezes.  ABDOMEN: Soft, flat, nontender, nondistended. Has good bowel sounds. No hepatosplenomegaly appreciated.  EXTREMITIES: No evidence of any cyanosis, clubbing, or peripheral edema.  +2 pedal and radial pulses  bilaterally.  NEUROLOGIC: The patient is alert, awake, and oriented x3 with no focal motor or sensory deficits appreciated bilaterally.  SKIN: Moist and warm with no rashes appreciated.  Psych: Not anxious, depressed LN: No inguinal LN enlargement    Antibiotics   Anti-infectives (From admission, onward)   None      Medications   Scheduled Meds: . atorvastatin  40 mg Oral Daily  . carvedilol  3.125 mg Oral BID WC  . ferrous sulfate  325 mg Oral BID WC  . folic acid  1 mg Oral Daily  . furosemide  40 mg Oral BID  . insulin aspart  0-15 Units Subcutaneous TID WC  . insulin aspart  0-5 Units Subcutaneous QHS  . insulin glargine  10 Units Subcutaneous Daily  . pantoprazole  40 mg Intravenous Q12H  . pregabalin  100 mg Oral TID   Continuous Infusions: . famotidine (PEPCID) IV 20 mg (11/05/17 1042)   PRN Meds:.acetaminophen **OR** acetaminophen, diphenhydrAMINE, HYDROcodone-acetaminophen, ipratropium-albuterol, ondansetron **OR** ondansetron (ZOFRAN) IV, polyethylene glycol   Data Review:   Micro Results No results found for this or any previous visit (from the past 240 hour(s)).  Radiology Reports No results found.   CBC Recent Labs  Lab 11/03/17 1257 11/03/17 2130 11/04/17 0949 11/04/17 1811 11/05/17 0335   WBC 6.0 7.1  --   --  5.9  HGB 7.0* 7.4*  7.3* 7.6* 7.5* 7.6*  HCT 26.1* 25.7*  25.4* 26.6* 26.4* 26.5*  PLT 170 146*  --   --  151  MCV 73.9* 74.9*  --   --  75.1*  MCH 19.8* 21.6*  --   --  21.5*  MCHC 26.8* 28.8*  --   --  28.7*  RDW 23.3* 22.5*  --   --  22.8*  LYMPHSABS 1.4  --   --   --   --   MONOABS 0.4  --   --   --   --   EOSABS 0.1  --   --   --   --   BASOSABS 0.0  --   --   --   --     Chemistries  Recent Labs  Lab 11/03/17 1257  NA 138  K 4.3  CL 106  CO2 25  GLUCOSE 141*  BUN 21  CREATININE 1.33*  CALCIUM 8.7*   ------------------------------------------------------------------------------------------------------------------ estimated creatinine clearance is 44 mL/min (A) (by C-G formula based on SCr of 1.33 mg/dL (H)). ------------------------------------------------------------------------------------------------------------------ No results for input(s): HGBA1C in the last 72 hours. ------------------------------------------------------------------------------------------------------------------ No results for input(s): CHOL, HDL, LDLCALC, TRIG, CHOLHDL, LDLDIRECT in the last 72 hours. ------------------------------------------------------------------------------------------------------------------ No results for input(s): TSH, T4TOTAL, T3FREE, THYROIDAB in the last 72 hours.  Invalid input(s): FREET3 ------------------------------------------------------------------------------------------------------------------ No results for input(s): VITAMINB12, FOLATE, FERRITIN, TIBC, IRON, RETICCTPCT in the last 72 hours.  Coagulation profile No results for input(s): INR, PROTIME in the last 168 hours.  No results for input(s): DDIMER in the last 72 hours.  Cardiac Enzymes No results for input(s): CKMB, TROPONINI, MYOGLOBIN in the last 168 hours.  Invalid input(s):  CK ------------------------------------------------------------------------------------------------------------------ Invalid input(s): POCBNP    Assessment & Plan   *Acute recurrent GIB Status post 1 unit of packed RBCs Previous work-up with EGD colonoscopy negative capsule endoscopy study results shows AVM Plan for enteroscopy tomorrow GI  *Acute recurrent symptomatic anemia Anemia work-up last admission noted for iron deficiency, folate deficiency, and blood loss anemia  Continue iron and folate supplementation   *Chronic diabetes mellitus type 2  Sliding scale insulin with Accu-Cheks per routine   *Chronic diastolic heart failure without exacerbation Stable Continue beta-blockers, Lasix Most recent echo noted for EF 50 to 55%  *Chronic morbid obesity Most likely secondary to excess calories Lifestyle modification recommended  *Severe mitral regurg patient states that she was seen by her cardiologist      Code Status Orders  (From admission, onward)         Start     Ordered   11/03/17 1758  Full code  Continuous     11/03/17 1757        Code Status History    Date Active Date Inactive Code Status Order ID Comments User Context   09/26/2017 2353 09/30/2017 1404 Full Code [id number]  Lance Coon, MD Inpatient   08/27/2017 0347 08/29/2017 2054 Full Code [id number]  Harrie Foreman, MD Inpatient   08/07/2017 1704 08/09/2017 2237 Full Code [id number]  Saundra Shelling, MD Inpatient   12/18/2016 1146 12/26/2016 2032 Full Code [id number]  Vaughan Basta, MD Inpatient   12/18/2016 0959 12/18/2016 1146 Full Code [id number]  Vaughan Basta, MD ED   12/04/2015 0509 12/07/2015 1756 Full Code [id number]  Holley Raring, NP ED   10/28/2015 1640 10/29/2015 1711 Full Code [id number]  Bettey Costa, MD Inpatient   08/29/2015 1438 08/30/2015 1750 Full Code [id number]  Hugelmeyer, Ubaldo Glassing, DO Inpatient           Consults gastroenterology   DVT Prophylaxis  SCDs Lab Results  Component Value Date   PLT 151 11/05/2017     Time Spent in minutes   55mn  Greater than 50% of time spent in care coordination and counseling patient regarding the condition and plan of care.   SDustin FlockM.D on 11/05/2017 at 1:40 PM  Between 7am to 6pm - Pager - [phone]  After 6pm go to [email or web address] - [employer]   Office  [phone]

## 2017-11-05 NOTE — Progress Notes (Addendum)
[name], MD [address], [place], [place], [place], [address] [address], [place], [place], [place], [address] Phone: [phone]  Fax: [phone]   Subjective: Pt denies any signs of GI bleeding. The patient denies abdominal or flank pain, anorexia, nausea or vomiting, dysphagia, change in bowel habits or black or bloody stools or weight loss.   Objective: Exam: Vital signs in last 24 hours: Vitals:   11/04/17 2001 11/04/17 2215 11/05/17 0549 11/05/17 0826  BP:  104/63 121/74 (!) 106/54  Pulse:  69 72 72  Resp:    16  Temp:   98.3 F (36.8 C) 97.8 F (36.6 C)  TempSrc:   Oral Oral  SpO2: 97% 96% 99% 96%  Weight:       Weight change:   Intake/Output Summary (Last 24 hours) at 11/05/2017 1236 Last data filed at 11/05/2017 1127 Gross per 24 hour  Intake 678.72 ml  Output -  Net 678.72 ml    General: No acute distress, AAO x3 Abd: Soft, NT/ND, No HSM Skin: Warm, no rashes Neck: Supple, Trachea midline   Lab Results: Lab Results  Component Value Date   WBC 5.9 11/05/2017   HGB 7.6 (L) 11/05/2017   HCT 26.5 (L) 11/05/2017   MCV 75.1 (L) 11/05/2017   PLT 151 11/05/2017   Micro Results: No results found for this or any previous visit (from the past 240 hour(s)). Studies/Results: No results found. Medications:  Scheduled Meds: . atorvastatin  40 mg Oral Daily  . carvedilol  3.125 mg Oral BID WC  . ferrous sulfate  325 mg Oral BID WC  . folic acid  1 mg Oral Daily  . furosemide  40 mg Oral BID  . insulin aspart  0-15 Units Subcutaneous TID WC  . insulin aspart  0-5 Units Subcutaneous QHS  . insulin glargine  10 Units Subcutaneous Daily  . pantoprazole  40 mg Intravenous Q12H  . pregabalin  100 mg Oral TID   Continuous Infusions: . famotidine (PEPCID) IV 20 mg (11/05/17 1042)   PRN Meds:.acetaminophen **OR** acetaminophen, diphenhydrAMINE, HYDROcodone-acetaminophen, ipratropium-albuterol, ondansetron **OR** ondansetron (ZOFRAN) IV,  polyethylene glycol   Assessment: Active Problems:   GIB (gastrointestinal bleeding)    Plan: Low normal ferritin, and low iron, please replace with IV iron on this admission  Capsule study read, and report has been sent for scanning, see report for details.   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  This is likely in the proximal small bowel.  Part of the small bowel was covered with residual food and could not be entirely examined.  The AVMs seen are not bleeding at this time, but they can bleed intermittently and cause anemia, and these could explain patient's anemia.  Recent procedures for GI bleeding including upper endoscopy and colonoscopy have not explained the source of her anemia in the past.  Therefore, benefits of treating AVMs with ablation outweigh risks given her ongoing anemia.  No signs of active GI bleeding since presentation. No indication for emergent procedure today given no active bleeding.   We will plan for enteroscopy with Dr. Vicente Males this week, push enteroscopy versus balloon enteroscopy.  N.p.o. past midnight Possible enteroscopy tomorrow  Continues CBCs and transfuse PRN Avoid NSAIDs  Patient signed out to Dr. Vicente Males and he will be following the patient over the week  Dr. Posey Pronto informed of the plan and agrees with the above   LOS: 0  days   [name], MD 11/05/2017, 12:36 PM

## 2017-11-06 ENCOUNTER — Inpatient Hospital Stay: Payer: Medicaid Other | Admitting: Anesthesiology

## 2017-11-06 ENCOUNTER — Encounter
Admission: EM | Disposition: A | Discharge status: Home / Self Care | Payer: Self-pay | Source: Home / Self Care | Attending: Internal Medicine

## 2017-11-06 ENCOUNTER — Encounter: Payer: Self-pay | Admitting: Gastroenterology

## 2017-11-06 DIAGNOSIS — K5520 Angiodysplasia of colon without hemorrhage: Secondary | ICD-10-CM

## 2017-11-06 HISTORY — PX: ENTEROSCOPY: SHX5533

## 2017-11-06 LAB — GLUCOSE, CAPILLARY
Glucose-Capillary: 86 mg/dL (ref 70–99)
Glucose-Capillary: 76 mg/dL (ref 70–99)
Glucose-Capillary: 179 mg/dL — ABNORMAL HIGH (ref 70–99)

## 2017-11-06 LAB — CBC
HEMATOCRIT: 30.7 % — AB (ref 36.0–46.0)
Hemoglobin: 8.7 g/dL — ABNORMAL LOW (ref 12.0–15.0)
MCH: 21.3 pg — AB (ref 26.0–34.0)
MCHC: 28.3 g/dL — AB (ref 30.0–36.0)
MCV: 75.2 fL — AB (ref 80.0–100.0)
NRBC: 0 % (ref 0.0–0.2)
PLATELETS: 169 10*3/uL (ref 150–400)
RBC: 4.08 MIL/uL (ref 3.87–5.11)
RDW: 23.9 % — AB (ref 11.5–15.5)
WBC: 7 10*3/uL (ref 4.0–10.5)

## 2017-11-06 LAB — SAMPLE TO BLOOD BANK

## 2017-11-06 SURGERY — ENTEROSCOPY
Anesthesia: General | Laterality: Left

## 2017-11-06 MED ORDER — LIDOCAINE HCL (PF) 2 % IJ SOLN
INTRAMUSCULAR | Status: AC
  Filled 2017-11-06: qty 10

## 2017-11-06 MED ORDER — OMEPRAZOLE 40 MG PO CPDR: 40.0000 mg | DELAYED_RELEASE_CAPSULE | Freq: Every day | ORAL | 11 refills | Status: DC

## 2017-11-06 MED ORDER — LIDOCAINE HCL (PF) 2 % IJ SOLN
INTRAMUSCULAR | Status: DC | PRN
  Administered 2017-11-06: 50 mg

## 2017-11-06 MED ORDER — INSULIN GLARGINE 100 UNIT/ML ~~LOC~~ SOLN
8.0000 [IU] | Freq: Every day | SUBCUTANEOUS | Status: DC
  Administered 2017-11-06: 14:00:00 8 [IU] via SUBCUTANEOUS
  Filled 2017-11-06 (×2): qty 0.08

## 2017-11-06 MED ORDER — PROPOFOL 500 MG/50ML IV EMUL
INTRAVENOUS | Status: AC
  Filled 2017-11-06: qty 50

## 2017-11-06 MED ORDER — MIDAZOLAM HCL 2 MG/2ML IJ SOLN
INTRAMUSCULAR | Status: AC
  Filled 2017-11-06: qty 2

## 2017-11-06 MED ORDER — MIDAZOLAM HCL 5 MG/5ML IJ SOLN
INTRAMUSCULAR | Status: DC | PRN
  Administered 2017-11-06: 2 mg via INTRAVENOUS

## 2017-11-06 MED ORDER — PROPOFOL 10 MG/ML IV BOLUS
INTRAVENOUS | Status: DC | PRN
  Administered 2017-11-06: 30 mg via INTRAVENOUS

## 2017-11-06 MED ORDER — SODIUM CHLORIDE 0.9 % IV SOLN
INTRAVENOUS | Status: DC
  Administered 2017-11-06: 11:00:00 via INTRAVENOUS

## 2017-11-06 MED ORDER — PROPOFOL 500 MG/50ML IV EMUL
INTRAVENOUS | Status: DC | PRN
  Administered 2017-11-06: 50 ug/kg/min via INTRAVENOUS

## 2017-11-06 NOTE — Consult Note (Signed)
[facility] Nurse wound consult note Reason for Consult:Diabetic patient with I & D to left buttocks wound.  Patient is poor historian but thinks that is what happened.  She is to receive a blood transfusion for anemia and GI Bleed Wound type:infectious  Pressure Injury POA: NA Measurement: 1.4 cm linear abrasion.  No purulence noted.  States it is not tender now.  Wound REQ:JEAD pink Drainage (amount, consistency, odor) none noted Periwound:intact Dressing procedure/placement/frequency:Cleanse I & D to left buttock with NS.  Gently pack dead space with iodoform packing strip.  Cover with silicone foam dressing.  Change packing daily and change foam dressing every three days and PRN soilage.  Will not follow at this time.  Please re-consult if needed.  Domenic Moras MSN, RN, FNP-BC CWON Wound, Ostomy, Continence Nurse Pager [phone]

## 2017-11-06 NOTE — Anesthesia Preprocedure Evaluation (Signed)
Anesthesia Evaluation  Patient identified by MRN, date of birth, ID band Patient awake    Reviewed: Allergy & Precautions, H&P , NPO status , Patient's Chart, lab work & pertinent test results  History of Anesthesia Complications Negative for: history of anesthetic complications  Airway Mallampati: III  TM Distance: >3 FB Neck ROM: full    Dental  (+) Chipped, Poor Dentition, Missing   Pulmonary neg shortness of breath, pneumonia, COPD, Current Smoker,           Cardiovascular Exercise Tolerance: Good (-) angina+ CAD, + Past MI and +CHF       Neuro/Psych negative neurological ROS  negative psych ROS   GI/Hepatic negative GI ROS, Neg liver ROS,   Endo/Other  diabetes, Type 2, Insulin Dependent  Renal/GU negative Renal ROS  negative genitourinary   Musculoskeletal   Abdominal   Peds  Hematology  (+) Blood dyscrasia, anemia ,   Anesthesia Other Findings Past Medical History: No date: CHF (congestive heart failure) (HCC) No date: COPD (chronic obstructive pulmonary disease) (HCC) No date: Diabetes mellitus without complication (HCC) No date: NSTEMI (non-ST elevated myocardial infarction) ([place])  Past Surgical History: No date: APPENDECTOMY No date: CARDIAC CATHETERIZATION No date: CHOLECYSTECTOMY 09/28/2017: COLONOSCOPY; N/A     Comment:  Procedure: COLONOSCOPY;  Surgeon: Toledo, Benay Pike, MD;              Location: [facility] ENDOSCOPY;  Service: Gastroenterology;                Laterality: N/A; 12/19/2016: ESOPHAGOGASTRODUODENOSCOPY; N/A     Comment:  Procedure: ESOPHAGOGASTRODUODENOSCOPY (EGD);  Surgeon:               Lin Landsman, MD;  Location: [facility] ENDOSCOPY;                Service: Gastroenterology;  Laterality: N/A; 09/28/2017: ESOPHAGOGASTRODUODENOSCOPY; N/A     Comment:  Procedure: ESOPHAGOGASTRODUODENOSCOPY (EGD);  Surgeon:               Toledo, Benay Pike, MD;  Location: [facility] ENDOSCOPY;      Service: Gastroenterology;  Laterality: N/A; 08/08/2017: ESOPHAGOGASTRODUODENOSCOPY (EGD) WITH PROPOFOL; N/A     Comment:  Procedure: ESOPHAGOGASTRODUODENOSCOPY (EGD) WITH               PROPOFOL;  Surgeon: [name], MD;  Location: [facility]               ENDOSCOPY;  Service: Endoscopy;  Laterality: N/A; 12/23/2016: LEFT HEART CATH AND CORONARY ANGIOGRAPHY; N/A     Comment:  Procedure: LEFT HEART CATH AND CORONARY ANGIOGRAPHY;                Surgeon: Teodoro Spray, MD;  Location: [facility] CV              LAB;  Service: Cardiovascular;  Laterality: N/A;  BMI    Body Mass Index:  26.96 kg/m      Reproductive/Obstetrics negative OB ROS                             Anesthesia Physical Anesthesia Plan  ASA: III  Anesthesia Plan: General   Post-op Pain Management:    Induction: Intravenous  PONV Risk Score and Plan: Propofol infusion and TIVA  Airway Management Planned: Natural Airway and Nasal Cannula  Additional Equipment:   Intra-op Plan:   Post-operative Plan:   Informed Consent: I have reviewed the patients History and  Physical, chart, labs and discussed the procedure including the risks, benefits and alternatives for the proposed anesthesia with the patient or authorized representative who has indicated his/her understanding and acceptance.   Dental Advisory Given  Plan Discussed with: Anesthesiologist, CRNA and Surgeon  Anesthesia Plan Comments: (Patient consented for risks of anesthesia including but not limited to:  - adverse reactions to medications - risk of intubation if required - damage to teeth, lips or other oral mucosa - sore throat or hoarseness - Damage to heart, brain, lungs or loss of life  Patient voiced understanding.)        Anesthesia Quick Evaluation

## 2017-11-06 NOTE — Progress Notes (Signed)
Inpatient Diabetes Program Recommendations  AACE/ADA: New Consensus Statement on Inpatient Glycemic Control (2019)  Target Ranges:  Prepandial:   less than 140 mg/dL      Peak postprandial:   less than 180 mg/dL (1-2 hours)      Critically ill patients:  140 - 180 mg/dL   Results for TAYSIA, RIVERE (MRN [id number]) as of 11/06/2017 08:15  Ref. Range 11/05/2017 07:44 11/05/2017 11:52 11/05/2017 16:57 11/05/2017 17:09 11/05/2017 21:22 11/06/2017 07:29  Glucose-Capillary Latest Ref Range: 70 - 99 mg/dL 101 (H) 121 (H) 62 (L) 76 95 86  Results for ZADA, HASER (MRN [id number]) as of 11/06/2017 08:15  Ref. Range 08/26/2017 17:51 09/27/2017 04:03  Hemoglobin A1C Latest Ref Range: 4.8 - 5.6 % 6.3 (H) 6.2 (H)   Review of Glycemic Control  Diabetes history: DM2 Outpatient Diabetes medications: Lantus 16 units daily Current orders for Inpatient glycemic control: Lantus 10 units daily, Novolog 0-15 units TID with meals, Novolog 0-5 units QHS  Inpatient Diabetes Program Recommendations: Insulin - Basal: Please consider decreasing Lantus to 8 units daily.  Thanks, Barnie Alderman, RN, MSN, CDE Diabetes Coordinator Inpatient Diabetes Program [phone] (Team Pager from 8am to 5pm)

## 2017-11-06 NOTE — H&P (Signed)
Jonathon Bellows, MD [address] [place], [place], [place], [address] [address], [place], [place], [place], [address] Phone: [phone]  Fax: [phone]  Primary Care Physician:  Patient, No Pcp Per   Pre-Procedure History & Physical: HPI:  [name] is a 62 y.o. female is here for an push endoscopy    Past Medical History:  Diagnosis Date  . CHF (congestive heart failure) ([place])   . COPD (chronic obstructive pulmonary disease) ([place])   . Diabetes mellitus without complication ([place])   . NSTEMI (non-ST elevated myocardial infarction) [facility])     Past Surgical History:  Procedure Laterality Date  . APPENDECTOMY    . CARDIAC CATHETERIZATION    . CHOLECYSTECTOMY    . COLONOSCOPY N/A 09/28/2017   Procedure: COLONOSCOPY;  Surgeon: Toledo, Benay Pike, MD;  Location: [facility] ENDOSCOPY;  Service: Gastroenterology;  Laterality: N/A;  . ESOPHAGOGASTRODUODENOSCOPY N/A 12/19/2016   Procedure: ESOPHAGOGASTRODUODENOSCOPY (EGD);  Surgeon: Lin Landsman, MD;  Location: [facility] ENDOSCOPY;  Service: Gastroenterology;  Laterality: N/A;  . ESOPHAGOGASTRODUODENOSCOPY N/A 09/28/2017   Procedure: ESOPHAGOGASTRODUODENOSCOPY (EGD);  Surgeon: Toledo, Benay Pike, MD;  Location: [facility] ENDOSCOPY;  Service: Gastroenterology;  Laterality: N/A;  . ESOPHAGOGASTRODUODENOSCOPY (EGD) WITH PROPOFOL N/A 08/08/2017   Procedure: ESOPHAGOGASTRODUODENOSCOPY (EGD) WITH PROPOFOL;  Surgeon: [name], MD;  Location: [facility] ENDOSCOPY;  Service: Endoscopy;  Laterality: N/A;  . LEFT HEART CATH AND CORONARY ANGIOGRAPHY N/A 12/23/2016   Procedure: LEFT HEART CATH AND CORONARY ANGIOGRAPHY;  Surgeon: Teodoro Spray, MD;  Location: [facility] CV LAB;  Service: Cardiovascular;  Laterality: N/A;    Prior to Admission medications   Medication Sig Start Date End Date Taking? Authorizing Provider  atorvastatin (LIPITOR) 40 MG tablet Take 1 tablet (40 mg total) by mouth daily. 08/29/17 12/27/17 Yes Dustin Flock,  MD  blood glucose meter kit and supplies KIT Dispense based on patient and insurance preference. Use up to four times daily as directed. (FOR ICD-9 250.00, 250.01). 12/24/16  Yes Hillary Bow, MD  carvedilol (COREG) 3.125 MG tablet Take 1 tablet (3.125 mg total) by mouth 2 (two) times daily with a meal. 08/29/17  Yes Dustin Flock, MD  ferrous sulfate 325 (65 FE) MG tablet Take 1 tablet (325 mg total) by mouth 2 (two) times daily with a meal. 09/30/17  Yes Epifanio Lesches, MD  folic acid (FOLVITE) 1 MG tablet Take 1 tablet (1 mg total) by mouth daily. 09/30/17  Yes Epifanio Lesches, MD  furosemide (LASIX) 40 MG tablet Take 1 tablet (40 mg total) by mouth 2 (two) times daily. 08/29/17 08/29/18 Yes Dustin Flock, MD  Insulin Glargine (LANTUS SOLOSTAR) 100 UNIT/ML Solostar Pen Inject 15 Units into the skin daily. Patient taking differently: Inject 16 Units into the skin daily.  08/29/17  Yes Dustin Flock, MD  Insulin Pen Needle 32G X 5 MM MISC 1 Units by Does not apply route daily. 12/24/16  Yes Sudini, Alveta Heimlich, MD  ondansetron (ZOFRAN) 4 MG tablet Take 1 tablet (4 mg total) by mouth every 6 (six) hours as needed for nausea. 08/29/17  Yes Dustin Flock, MD  pregabalin (LYRICA) 100 MG capsule Take 100-200 mg by mouth See admin instructions. Take 1 capsule (100MG) by mouth every morning and 2 capsules (200MG) by mouth every night   Yes [provider]  PROAIR HFA 108 (90 Base) MCG/ACT inhaler Inhale 1-2 puffs into the lungs every 4 (four) hours as needed. 10/10/17  Yes [provider]  SYMBICORT 160-4.5 MCG/ACT  inhaler Inhale 2 puffs into the lungs 2 (two) times daily. 10/10/17  Yes [provider]    Allergies as of 11/03/2017 - Review Complete 11/03/2017  Allergen Reaction Noted  . Latex Anaphylaxis and Rash 11/07/2012  . Gabapentin Nausea And Vomiting and Other (See Comments) 12/20/2012    Family History  Problem Relation Age of Onset  . Hypertension Mother     . Diabetes Mellitus II Mother   . Heart disease Father     Social History   Socioeconomic History  . Marital status: Married    Spouse name: Not on file  . Number of children: Not on file  . Years of education: Not on file  . Highest education level: Not on file  Occupational History  . Not on file  Social Needs  . Financial resource strain: Not on file  . Food insecurity:    Worry: Not on file    Inability: Not on file  . Transportation needs:    Medical: Not on file    Non-medical: Not on file  Tobacco Use  . Smoking status: Current Some Day Smoker    Packs/day: 0.25    Types: Cigarettes  . Smokeless tobacco: Never Used  Substance and Sexual Activity  . Alcohol use: No  . Drug use: No  . Sexual activity: Not on file  Lifestyle  . Physical activity:    Days per week: Not on file    Minutes per session: Not on file  . Stress: Not on file  Relationships  . Social connections:    Talks on phone: Not on file    Gets together: Not on file    Attends religious service: Not on file    Active member of club or organization: Not on file    Attends meetings of clubs or organizations: Not on file    Relationship status: Not on file  . Intimate partner violence:    Fear of current or ex partner: Not on file    Emotionally abused: Not on file    Physically abused: Not on file    Forced sexual activity: Not on file  Other Topics Concern  . Not on file  Social History Narrative  . Not on file    Review of Systems: See HPI, otherwise negative ROS  Physical Exam: BP 139/83   Pulse 73   Temp (!) 97.5 F (36.4 C) (Tympanic)   Resp 18   Wt 73.5 kg   SpO2 100%   BMI 26.96 kg/m  General:   Alert,  pleasant and cooperative in NAD Head:  Normocephalic and atraumatic. Neck:  Supple; no masses or thyromegaly. Lungs:  Clear throughout to auscultation, normal respiratory effort.    Heart:  +S1, +S2, Regular rate and rhythm, No edema. Abdomen:  Soft, nontender and  nondistended. Normal bowel sounds, without guarding, and without rebound.   Neurologic:  Alert and  oriented x4;  grossly normal neurologically.  Impression/Plan: [name] is here for a push  endoscopy  to be performed for  evaluation of AVM's seen on capsule study     Risks, benefits, limitations, and alternatives regarding endoscopy have been reviewed with the patient.  Questions have been answered.  All parties agreeable.   Jonathon Bellows, MD  11/06/2017, 11:55 AM

## 2017-11-06 NOTE — Transfer of Care (Signed)
Immediate Anesthesia Transfer of Care Note  Patient: [name]  Procedure(s) Performed: ENTEROSCOPY (Left )  Patient Location: PACU  Anesthesia Type:General  Level of Consciousness: sedated  Airway & Oxygen Therapy: Patient Spontanous Breathing and Patient connected to nasal cannula oxygen  Post-op Assessment: Report given to RN and Post -op Vital signs reviewed and stable  Post vital signs: Reviewed and stable  Last Vitals:  Vitals Value Taken Time  BP 100/54 11/06/2017 12:34 PM  Temp 36.1 C 11/06/2017 12:33 PM  Pulse 70 11/06/2017 12:35 PM  Resp 16 11/06/2017 12:35 PM  SpO2 100 % 11/06/2017 12:35 PM  Vitals shown include unvalidated device data.  Last Pain:  Vitals:   11/06/17 1233  TempSrc: Tympanic  PainSc:          Complications: No apparent anesthesia complications

## 2017-11-06 NOTE — Progress Notes (Signed)
Patient husband was in room he said she is too weak to go home today and would like Dr. Posey Pronto to give him a call.

## 2017-11-06 NOTE — Progress Notes (Signed)
Received MD order to discharge patient to home reviewed homes meds, discharge instructions, prescription and follow up appointments with patient and patient verbalized understanding

## 2017-11-06 NOTE — Anesthesia Postprocedure Evaluation (Signed)
Anesthesia Post Note  Patient: YECENIA DALGLEISH  Procedure(s) Performed: ENTEROSCOPY (Left )  Patient location during evaluation: Endoscopy Anesthesia Type: General Level of consciousness: awake and alert and oriented Pain management: pain level controlled Vital Signs Assessment: post-procedure vital signs reviewed and stable Respiratory status: spontaneous breathing, nonlabored ventilation and respiratory function stable Cardiovascular status: blood pressure returned to baseline and stable Postop Assessment: no signs of nausea or vomiting Anesthetic complications: no     Last Vitals:  Vitals:   11/06/17 1300 11/06/17 1316  BP: 116/67 132/79  Pulse:  71  Resp:  18  Temp:  36.4 C  SpO2:  100%    Last Pain:  Vitals:   11/06/17 1316  TempSrc: Oral  PainSc:                  Shantia Sanford

## 2017-11-06 NOTE — Anesthesia Post-op Follow-up Note (Signed)
Anesthesia QCDR form completed.        

## 2017-11-06 NOTE — Discharge Summary (Signed)
[facility], 62 y.o., DOB 07-02-1955, MRN [id number]. 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 female with a known history per below which includes recent hospital admission last month for acute upper GI bleeding-status post EGD and colonoscopy noted for hemorrhoids/status post 4 units blood transfusion for symptomatic anemia.  Patient was readmitted with anemia.  Underwent evaluation with a capsule endoscopy due to having endoscopy and colonoscopy previously.  Capsule endoscopy showed a possible AVM.  Patient had a enteroscopy which confirmed a 3 bleeding angiolytic TCS in the stomach treated with argon plasma anticoagulation.  Patient tolerated the procedure without any complications           Consults  GI  Significant Tests:  See full reports for all details     No results found.     Today   Subjective:   [name] patient doing well denies any complaints  Objective:   Blood pressure 132/79, pulse 71, temperature 97.6 F (36.4 C), temperature source Oral, resp. rate 18, weight 73.5 kg, SpO2 100 %.  .  Intake/Output Summary (Last 24 hours) at 11/06/2017 1446 Last data filed at 11/06/2017 1251 Gross per 24 hour  Intake 570 ml  Output 1 ml  Net 569 ml    Exam VITAL SIGNS: Blood pressure 132/79, pulse 71, temperature 97.6 F (36.4 C), temperature source Oral, resp. rate 18, weight 73.5 kg, SpO2 100 %.  GENERAL:  62 y.o.-year-old patient lying in the bed with no acute distress.  EYES: Pupils  equal, round, reactive to light and accommodation. No scleral icterus. Extraocular muscles intact.  HEENT: Head atraumatic, normocephalic. Oropharynx and nasopharynx clear.  NECK:  Supple, no jugular venous distention. No thyroid enlargement, no tenderness.  LUNGS: Normal breath sounds bilaterally, no wheezing, rales,rhonchi or crepitation. No use of accessory muscles of respiration.  CARDIOVASCULAR: S1, S2 normal. No murmurs, rubs, or gallops.  ABDOMEN: Soft, nontender, nondistended. Bowel sounds present. No organomegaly or mass.  EXTREMITIES: No pedal edema, cyanosis, or clubbing.  NEUROLOGIC: Cranial nerves II through XII are intact. Muscle strength 5/5 in all extremities. Sensation intact. Gait not checked.  PSYCHIATRIC: The patient is alert and oriented x 3.  SKIN: No obvious rash, lesion, or ulcer.   Data Review     CBC w Diff:  Lab Results  Component Value Date   WBC 7.0 11/06/2017   HGB 8.7 (L) 11/06/2017   HGB 15.0 08/03/2013   HCT 30.7 (L) 11/06/2017   HCT 45.7 08/03/2013   PLT 169 11/06/2017   PLT 118 (L) 08/03/2013   LYMPHOPCT 23 11/03/2017   LYMPHOPCT 30.9 08/03/2013   MONOPCT 7 11/03/2017   MONOPCT 6.7 08/03/2013   EOSPCT 2 11/03/2017   EOSPCT 3.0 08/03/2013   BASOPCT 1 11/03/2017   BASOPCT 0.7 08/03/2013   CMP:  Lab Results  Component Value Date   NA 138 11/03/2017   NA 139 08/03/2013   K 4.3 11/03/2017   K 3.4 (L) 08/03/2013   CL 106 11/03/2017   CL 104 08/03/2013   CO2 25 11/03/2017   CO2 27 08/03/2013  BUN 21 11/03/2017   BUN 16 08/03/2013   CREATININE 1.33 (H) 11/03/2017   CREATININE 1.19 08/03/2013   PROT 8.1 08/26/2017   PROT 9.3 (H) 03/12/2012   ALBUMIN 3.5 08/26/2017   ALBUMIN 3.7 03/12/2012   BILITOT 0.6 08/26/2017   BILITOT 0.5 03/12/2012   ALKPHOS 98 08/26/2017   ALKPHOS 199 (H) 03/12/2012   AST 15 08/26/2017   AST 40 (H) 03/12/2012   ALT 8 08/26/2017   ALT 34 03/12/2012  .  Micro Results No results found for this or any  previous visit (from the past 240 hour(s)).      Code Status Orders  (From admission, onward)         Start     Ordered   11/03/17 1758  Full code  Continuous     11/03/17 1757        Code Status History    Date Active Date Inactive Code Status Order ID Comments User Context   09/26/2017 2353 09/30/2017 1404 Full Code [id number]  Lance Coon, MD Inpatient   08/27/2017 0347 08/29/2017 2054 Full Code [id number]  Harrie Foreman, MD Inpatient   08/07/2017 1704 08/09/2017 2237 Full Code [id number]  Saundra Shelling, MD Inpatient   12/18/2016 1146 12/26/2016 2032 Full Code [id number]  Vaughan Basta, MD Inpatient   12/18/2016 0959 12/18/2016 1146 Full Code [id number]  Vaughan Basta, MD ED   12/04/2015 0509 12/07/2015 1756 Full Code [id number]  Holley Raring, NP ED   10/28/2015 1640 10/29/2015 1711 Full Code [id number]  Bettey Costa, MD Inpatient   08/29/2015 1438 08/30/2015 1750 Full Code [id number]  Hugelmeyer, Ubaldo Glassing, DO Inpatient          Follow-up Information    Virgel Manifold, MD Follow up in 2 week(s).   Specialty:  Gastroenterology Contact information: [place] [address] [phone]        pcp Follow up in 1 week(s).        Cammie Sickle, MD Follow up in 2 week(s).   Specialties:  Internal Medicine, Oncology Why:  anemia requring transfusion Contact information: [place] [address] [phone]           Discharge Medications   Allergies as of 11/06/2017      Reactions   Latex Anaphylaxis, Rash   Gabapentin Nausea And Vomiting, Other (See Comments)      Medication List    TAKE these medications   atorvastatin 40 MG tablet Commonly known as:  LIPITOR Take 1 tablet (40 mg total) by mouth daily.   blood glucose meter kit and supplies Kit Dispense based on patient and insurance preference. Use up to four times daily as directed. (FOR ICD-9 250.00, 250.01).   carvedilol 3.125 MG  tablet Commonly known as:  COREG Take 1 tablet (3.125 mg total) by mouth 2 (two) times daily with a meal.   ferrous sulfate 325 (65 FE) MG tablet Take 1 tablet (325 mg total) by mouth 2 (two) times daily with a meal.   folic acid 1 MG tablet Commonly known as:  FOLVITE Take 1 tablet (1 mg total) by mouth daily.   furosemide 40 MG tablet Commonly known as:  LASIX Take 1 tablet (40 mg total) by mouth 2 (two) times daily.   Insulin Glargine 100 UNIT/ML Solostar Pen Commonly known as:  LANTUS Inject 15 Units into the skin daily. What changed:  how much to take   Insulin Pen Needle 32G X 5 MM  Misc 1 Units by Does not apply route daily.   omeprazole 40 MG capsule Commonly known as:  PRILOSEC Take 1 capsule (40 mg total) by mouth daily.   ondansetron 4 MG tablet Commonly known as:  ZOFRAN Take 1 tablet (4 mg total) by mouth every 6 (six) hours as needed for nausea.   pregabalin 100 MG capsule Commonly known as:  LYRICA Take 100-200 mg by mouth See admin instructions. Take 1 capsule (100MG) by mouth every morning and 2 capsules (200MG) by mouth every night   PROAIR HFA 108 (90 Base) MCG/ACT inhaler Generic drug:  albuterol Inhale 1-2 puffs into the lungs every 4 (four) hours as needed.   SYMBICORT 160-4.5 MCG/ACT inhaler Generic drug:  budesonide-formoterol Inhale 2 puffs into the lungs 2 (two) times daily.          Total Time in preparing paper work, data evaluation and todays exam - 31 minutes  Dustin Flock M.D on 11/06/2017 at 2:46 PM [place]  [phone]

## 2017-11-06 NOTE — Progress Notes (Signed)
Patient will call husband and let him know she is coming home

## 2017-11-06 NOTE — Op Note (Signed)
[facility] Gastroenterology Patient Name: [name] Procedure Date: 11/06/2017 12:01 PM MRN: [id number] Account #: [id number] Date of Birth: 1955-01-22 Admit Type: Inpatient Age: 62 Room: [facility] ENDO ROOM 4 Gender: Female Note Status: Finalized Procedure:            Small bowel enteroscopy Indications:          Arteriovenous malformation in the small intestine Providers:            Jonathon Bellows MD, MD Referring MD:         No Local Md, MD (Referring MD) Medicines:            Monitored Anesthesia Care Complications:        No immediate complications. Procedure:            Pre-Anesthesia Assessment:                       - Prior to the procedure, a History and Physical was                        performed, and patient medications, allergies and                        sensitivities were reviewed. The patient's tolerance of                        previous anesthesia was reviewed.                       - The risks and benefits of the procedure and the                        sedation options and risks were discussed with the                        patient. All questions were answered and informed                        consent was obtained.                       - ASA Grade Assessment: III - A patient with severe                        systemic disease.                       After obtaining informed consent, the endoscope was                        passed under direct vision. Throughout the procedure,                        the patient's blood pressure, pulse, and oxygen                        saturations were monitored continuously. The                        Colonoscope was introduced through the mouth and  advanced to the proximal jejunum. The small bowel                        enteroscopy was accomplished with ease. The patient                        tolerated the procedure well. Findings:      A single angioectasia with no bleeding was  found in the proximal       jejunum. Coagulation for hemostasis using argon plasma at 0.5       liters/minute and 20 watts was successful.      Three 8 mm bleeding angioectasias were found on the greater curvature of       the gastric body. Coagulation for hemostasis using argon plasma at 0.5       liters/minute and 20 watts was successful.      The esophagus was normal. Impression:           - A single non-bleeding angioectasia in the jejunum.                        Treated with argon plasma coagulation (APC).                       - Three bleeding angioectasias in the stomach. Treated                        with argon plasma coagulation (APC).                       - Normal esophagus.                       - No specimens collected. Recommendation:       - Return patient to hospital ward for ongoing care.                       - Advance diet as tolerated.                       - 1. Discharge on prilosec 40 mg q daily, no nsaids                       2. F/u with Dr Bonna Gains in the OP Procedure Code(s):    --- Professional ---                       [phone], Small intestinal endoscopy, enteroscopy beyond                        second portion of duodenum, not including ileum; with                        control of bleeding (eg, injection, bipolar cautery,                        unipolar cautery, laser, heater probe, stapler, plasma                        coagulator) Diagnosis Code(s):    --- Professional ---  K55.20, Angiodysplasia of colon without hemorrhage                       K31.811, Angiodysplasia of stomach and duodenum with                        bleeding                       Q27.33, Arteriovenous malformation of digestive system                        vessel CPT copyright 2018 [employer]. All rights reserved. The codes documented in this report are preliminary and upon coder review may  be revised to meet current compliance  requirements. Jonathon Bellows, MD Jonathon Bellows MD, MD 11/06/2017 12:33:42 PM This report has been signed electronically. Number of Addenda: 0 Note Initiated On: 11/06/2017 12:01 PM      [facility]

## 2017-11-06 NOTE — Progress Notes (Addendum)
Per Dr Posey Pronto patient has agreed to be discharged and wants to go home.  Let Dr Posey Pronto know that husband thought wife was to weak to go home, and wanted a call.

## 2017-11-07 ENCOUNTER — Encounter: Payer: Self-pay | Admitting: Gastroenterology

## 2017-11-10 ENCOUNTER — Telehealth: Payer: Self-pay

## 2017-11-10 NOTE — Telephone Encounter (Signed)
EMMI Follow-up: Noted on the report that patient hadn't read her discharge papers yet and didn't know who to contact if there was a change in her condition as no PCP listed.  I talked with [name] and offered the number for the free [facility] physician line but she had found a PCP for a follow-up appointment. No needs noted for today. I let her know there would be a second automated call and to let us know if she had any concerns at that time.

## 2017-11-20 ENCOUNTER — Inpatient Hospital Stay: Payer: Medicaid Other | Admitting: Internal Medicine

## 2017-11-23 ENCOUNTER — Encounter: Payer: Self-pay | Admitting: Gastroenterology

## 2017-11-23 ENCOUNTER — Ambulatory Visit: Payer: Medicaid Other | Admitting: Gastroenterology

## 2017-11-23 VITALS — BP 154/94 | HR 94 | Ht 65.0 in | Wt 181.6 lb

## 2017-11-23 DIAGNOSIS — D508 Other iron deficiency anemias: Secondary | ICD-10-CM | POA: Diagnosis not present

## 2017-11-23 NOTE — Progress Notes (Signed)
[name], MD [address]  [place]  [place], [place] [address]  Main: [phone]  Fax: [phone]   Primary Care Physician: Patient, No Pcp Per  Primary Gastroenterologist:  Dr. [name]  Chief Complaint  Patient presents with  . New Patient (Initial Visit)    hospital f/u GI Bleed    HPI: [name] is a 62 y.o. female here for posthospitalization follow-up for anemia.  Patient underwent small bowel capsule study on recent hospitalization which showed nonbleeding AVMs and she underwent subsequent enteroscopy with Dr. Vicente Males on October 21.  Angiectasia with no bleeding from the proximal jejunum was coagulated with APC.  8 mm bleeding angiectasia seen in the greater curvature of the gastric body treated with APC as well.  Patient denies any melena or rectal bleeding.  No nausea or vomiting.  Reports improved appetite since hospital discharge.  No abdominal pain.  Previous history: History of iron deficiency anemia with multiple admissions for the same in the recent past.  As per consult note by Dr. Ricky Stabs.  In September 2019, patient underwent cardiac cath in December 2018, showing severe stenosis and CABG was recommended but was not done.  "She was admitted to hospital in July and underwent EGD while in the hospital by Dr. Allen Norris on 07/23 which showed gastritis. EGD while admitted in December performed 12/19/2016..showed superficial gastric erosions from NG trauma with adherent blood clot, treated with heater probe..No colonoscopy was performed."  Last EGD September 2019 by Dr. Alice Reichert, normal exam.  Colonoscopy September 2019 by Dr. Alice Reichert, internal hemorrhoids, 5 mm sigmoid polyp removed.  Current Outpatient Medications  Medication Sig Dispense Refill  . atorvastatin (LIPITOR) 40 MG tablet Take 1 tablet (40 mg total) by mouth daily. 30 tablet 3  . blood glucose meter kit and supplies KIT Dispense based on patient and insurance preference. Use  up to four times daily as directed. (FOR ICD-9 250.00, 250.01). 1 each 0  . carvedilol (COREG) 3.125 MG tablet Take 1 tablet (3.125 mg total) by mouth 2 (two) times daily with a meal. 60 tablet 0  . ferrous sulfate 325 (65 FE) MG tablet Take 1 tablet (325 mg total) by mouth 2 (two) times daily with a meal. 30 tablet 3  . folic acid (FOLVITE) 1 MG tablet Take 1 tablet (1 mg total) by mouth daily. 30 tablet 0  . furosemide (LASIX) 40 MG tablet Take 1 tablet (40 mg total) by mouth 2 (two) times daily. 60 tablet 11  . Insulin Glargine (LANTUS SOLOSTAR) 100 UNIT/ML Solostar Pen Inject 15 Units into the skin daily. (Patient taking differently: Inject 16 Units into the skin daily. ) 300 mL 0  . Insulin Pen Needle 32G X 5 MM MISC 1 Units by Does not apply route daily. 100 each 0  . omeprazole (PRILOSEC) 40 MG capsule Take 1 capsule (40 mg total) by mouth daily. 30 capsule 11  . pregabalin (LYRICA) 100 MG capsule Take 100-200 mg by mouth See admin instructions. Take 1 capsule (100MG) by mouth every morning and 2 capsules (200MG) by mouth every night    . PROAIR HFA 108 (90 Base) MCG/ACT inhaler Inhale 1-2 puffs into the lungs every 4 (four) hours as needed.  2  . SYMBICORT 160-4.5 MCG/ACT inhaler Inhale 2 puffs into the lungs 2 (two) times daily.  2  . ondansetron (ZOFRAN) 4 MG tablet Take 1 tablet (4 mg total) by mouth every 6 (six) hours as needed for nausea. (Patient not taking:  Reported on 11/23/2017) 20 tablet 0   No current facility-administered medications for this visit.     Allergies as of 11/23/2017 - Review Complete 11/23/2017  Allergen Reaction Noted  . Latex Anaphylaxis and Rash 11/07/2012  . Gabapentin Nausea And Vomiting and Other (See Comments) 12/20/2012    ROS:  General: Negative for anorexia, weight loss, fever, chills, fatigue, weakness. ENT: Negative for hoarseness, difficulty swallowing , nasal congestion. CV: Negative for chest pain, angina, palpitations, dyspnea on exertion,  peripheral edema.  Respiratory: Negative for dyspnea at rest, dyspnea on exertion, cough, sputum, wheezing.  GI: See history of present illness. GU:  Negative for dysuria, hematuria, urinary incontinence, urinary frequency, nocturnal urination.  Endo: Negative for unusual weight change.    Physical Examination:   BP (!) 154/94   Pulse 94   Ht _0  (1.651 m)   Wt 181 lb 9.6 oz (82.4 kg)   BMI 30.22 kg/m   General: Well-nourished, well-developed in no acute distress.  Eyes: No icterus. Conjunctivae pink. Mouth: Oropharyngeal mucosa moist and pink , no lesions erythema or exudate. Neck: Supple, Trachea midline Abdomen: Bowel sounds are normal, nontender, nondistended, no hepatosplenomegaly or masses, no abdominal bruits or hernia , no rebound or guarding.   Extremities: No lower extremity edema. No clubbing or deformities. Neuro: Alert and oriented x 3.  Grossly intact. Skin: Warm and dry, no jaundice.   Psych: Alert and cooperative, normal mood and affect.   Labs: CMP     Component Value Date/Time   NA 138 11/03/2017 1257   NA 139 08/03/2013 2138   K 4.3 11/03/2017 1257   K 3.4 (L) 08/03/2013 2138   CL 106 11/03/2017 1257   CL 104 08/03/2013 2138   CO2 25 11/03/2017 1257   CO2 27 08/03/2013 2138   GLUCOSE 141 (H) 11/03/2017 1257   GLUCOSE 153 (H) 08/03/2013 2138   BUN 21 11/03/2017 1257   BUN 16 08/03/2013 2138   CREATININE 1.33 (H) 11/03/2017 1257   CREATININE 1.19 08/03/2013 2138   CALCIUM 8.7 (L) 11/03/2017 1257   CALCIUM 9.2 08/03/2013 2138   PROT 8.1 08/26/2017 1751   PROT 9.3 (H) 03/12/2012 1104   ALBUMIN 3.5 08/26/2017 1751   ALBUMIN 3.7 03/12/2012 1104   AST 15 08/26/2017 1751   AST 40 (H) 03/12/2012 1104   ALT 8 08/26/2017 1751   ALT 34 03/12/2012 1104   ALKPHOS 98 08/26/2017 1751   ALKPHOS 199 (H) 03/12/2012 1104   BILITOT 0.6 08/26/2017 1751   BILITOT 0.5 03/12/2012 1104   GFRNONAA 42 (L) 11/03/2017 1257   GFRNONAA 50 (L) 08/03/2013 2138   GFRAA  49 (L) 11/03/2017 1257   GFRAA 58 (L) 08/03/2013 2138   Lab Results  Component Value Date   WBC 7.0 11/06/2017   HGB 8.7 (L) 11/06/2017   HCT 30.7 (L) 11/06/2017   MCV 75.2 (L) 11/06/2017   PLT 169 11/06/2017    Imaging Studies: No results found.  Assessment and Plan:   [name] is a 62 y.o. y/o female with history of iron deficiency anemia with most recent hospitalization in October 2019, with small bowel and gastric AVMs treated with APC here for follow-up  Patient clinically reports improved appetite Compliant with twice daily iron replacement We will recheck hemoglobin today Will refer to hematology for evaluation for IV iron replacement  If hemoglobin continues to improve, continue expectant management as above  Continue to avoid NSAIDs Continue close follow-up in clinic  Dr [name]

## 2017-11-24 LAB — HEMOGLOBIN: Hemoglobin: 7.3 g/dL — ABNORMAL LOW (ref 11.1–15.9)

## 2017-11-28 ENCOUNTER — Encounter: Payer: Self-pay | Admitting: Gastroenterology

## 2017-12-07 ENCOUNTER — Encounter: Payer: Self-pay | Admitting: Hematology and Oncology

## 2017-12-07 ENCOUNTER — Inpatient Hospital Stay: Payer: Medicaid Other

## 2017-12-07 ENCOUNTER — Inpatient Hospital Stay: Payer: Medicaid Other | Admitting: Hematology and Oncology

## 2017-12-07 ENCOUNTER — Other Ambulatory Visit: Payer: Self-pay

## 2017-12-07 ENCOUNTER — Inpatient Hospital Stay: Payer: Medicaid Other | Attending: Hematology and Oncology | Admitting: Hematology and Oncology

## 2017-12-07 ENCOUNTER — Other Ambulatory Visit: Payer: Self-pay | Admitting: Hematology and Oncology

## 2017-12-07 VITALS — BP 164/90 | HR 91 | Temp 97.8°F | Resp 18 | Ht 65.0 in | Wt 185.2 lb

## 2017-12-07 VITALS — BP 158/89 | HR 90 | Temp 98.2°F

## 2017-12-07 DIAGNOSIS — R0602 Shortness of breath: Secondary | ICD-10-CM | POA: Insufficient documentation

## 2017-12-07 DIAGNOSIS — Z803 Family history of malignant neoplasm of breast: Secondary | ICD-10-CM | POA: Diagnosis not present

## 2017-12-07 DIAGNOSIS — Q2733 Arteriovenous malformation of digestive system vessel: Secondary | ICD-10-CM | POA: Diagnosis not present

## 2017-12-07 DIAGNOSIS — D500 Iron deficiency anemia secondary to blood loss (chronic): Secondary | ICD-10-CM

## 2017-12-07 DIAGNOSIS — E538 Deficiency of other specified B group vitamins: Secondary | ICD-10-CM

## 2017-12-07 DIAGNOSIS — F17210 Nicotine dependence, cigarettes, uncomplicated: Secondary | ICD-10-CM

## 2017-12-07 DIAGNOSIS — E119 Type 2 diabetes mellitus without complications: Secondary | ICD-10-CM | POA: Diagnosis not present

## 2017-12-07 DIAGNOSIS — I509 Heart failure, unspecified: Secondary | ICD-10-CM

## 2017-12-07 DIAGNOSIS — I252 Old myocardial infarction: Secondary | ICD-10-CM

## 2017-12-07 DIAGNOSIS — Z79899 Other long term (current) drug therapy: Secondary | ICD-10-CM | POA: Diagnosis not present

## 2017-12-07 DIAGNOSIS — Z8719 Personal history of other diseases of the digestive system: Secondary | ICD-10-CM | POA: Diagnosis not present

## 2017-12-07 DIAGNOSIS — Z8541 Personal history of malignant neoplasm of cervix uteri: Secondary | ICD-10-CM | POA: Diagnosis not present

## 2017-12-07 DIAGNOSIS — R5383 Other fatigue: Secondary | ICD-10-CM | POA: Diagnosis not present

## 2017-12-07 DIAGNOSIS — K648 Other hemorrhoids: Secondary | ICD-10-CM | POA: Diagnosis not present

## 2017-12-07 DIAGNOSIS — Z794 Long term (current) use of insulin: Secondary | ICD-10-CM

## 2017-12-07 DIAGNOSIS — J449 Chronic obstructive pulmonary disease, unspecified: Secondary | ICD-10-CM | POA: Diagnosis not present

## 2017-12-07 LAB — CBC WITH DIFFERENTIAL/PLATELET
Abs Immature Granulocytes: 0.02 10*3/uL (ref 0.00–0.07)
Basophils Absolute: 0.1 10*3/uL (ref 0.0–0.1)
Basophils Relative: 1 %
Eosinophils Absolute: 0.1 10*3/uL (ref 0.0–0.5)
Eosinophils Relative: 1 %
HCT: 25.8 % — ABNORMAL LOW (ref 36.0–46.0)
Hemoglobin: 7.2 g/dL — ABNORMAL LOW (ref 12.0–15.0)
Immature Granulocytes: 0 %
Lymphocytes Relative: 20 %
Lymphs Abs: 1.3 10*3/uL (ref 0.7–4.0)
MCH: 20.1 pg — ABNORMAL LOW (ref 26.0–34.0)
MCHC: 27.9 g/dL — ABNORMAL LOW (ref 30.0–36.0)
MCV: 71.9 fL — ABNORMAL LOW (ref 80.0–100.0)
Monocytes Absolute: 0.4 10*3/uL (ref 0.1–1.0)
Monocytes Relative: 6 %
Neutro Abs: 4.5 10*3/uL (ref 1.7–7.7)
Neutrophils Relative %: 72 %
Platelets: 193 10*3/uL (ref 150–400)
RBC: 3.59 MIL/uL — ABNORMAL LOW (ref 3.87–5.11)
RDW: 22.4 % — ABNORMAL HIGH (ref 11.5–15.5)
WBC: 6.3 10*3/uL (ref 4.0–10.5)
nRBC: 0 % (ref 0.0–0.2)

## 2017-12-07 LAB — RETICULOCYTES
IMMATURE RETIC FRACT: 35.2 % — AB (ref 2.3–15.9)
RBC.: 3.59 MIL/uL — AB (ref 3.87–5.11)
RETIC COUNT ABSOLUTE: 34.8 10*3/uL (ref 19.0–186.0)
RETIC CT PCT: 1 % (ref 0.4–3.1)

## 2017-12-07 LAB — SAMPLE TO BLOOD BANK

## 2017-12-07 LAB — FERRITIN: FERRITIN: 16 ng/mL (ref 11–307)

## 2017-12-07 LAB — PREPARE RBC (CROSSMATCH)

## 2017-12-07 LAB — VITAMIN B12: VITAMIN B 12: 326 pg/mL (ref 180–914)

## 2017-12-07 LAB — FOLATE: Folate: 7.9 ng/mL (ref >5.9)

## 2017-12-07 MED ORDER — IRON SUCROSE 20 MG/ML IV SOLN
200.0000 mg | Freq: Once | INTRAVENOUS | Status: AC
  Administered 2017-12-07: 200 mg via INTRAVENOUS
  Filled 2017-12-07: qty 5

## 2017-12-07 MED ORDER — SODIUM CHLORIDE 0.9 % IV SOLN: 200.0000 mg | Freq: Once | INTRAVENOUS | Status: DC

## 2017-12-07 MED ORDER — SODIUM CHLORIDE 0.9 % IV SOLN
Freq: Once | INTRAVENOUS | Status: AC
  Administered 2017-12-07: 14:00:00 via INTRAVENOUS
  Filled 2017-12-07: qty 250

## 2017-12-07 MED ORDER — CYANOCOBALAMIN 1000 MCG/ML IJ SOLN
1000.0000 ug | Freq: Once | INTRAMUSCULAR | Status: AC
  Administered 2017-12-07: 1000 ug via INTRAMUSCULAR
  Filled 2017-12-07: qty 1

## 2017-12-07 NOTE — Progress Notes (Signed)
Patient here today as new evaluation regarding anemia.  Referred by Dr. Bonna Gains.  Patient had cervical cancer in her 3's.  She states she had open heart surgery one year ago.

## 2017-12-07 NOTE — Progress Notes (Deleted)
[facility] day:  12/07/2017  Chief Complaint: [name] is a 62 y.o. female with iron deficiency who is referred in consultation by Dr. Bonna Gains for assessment and management.  HPI: ***  The patient was admitted to [facility] from 12/18/2016 - 12/26/2016 with acute respiratory failure with hypoxia, pneumonia, CHF exacerbation.  She had a NSTEMI.  Cardiac catheterization showing 90% proximal LAD stenosis, 80% proximal circumflex stenosis and 100% RCA stenosis with collaterals from left.-3 vessel CABG recommended.  She developed coffee ground emesis.  EGD on 12/19/2016 by Dr. Marius Ditch revealed blood clot in the gastric body.  There were non-bleeding gastric ulcers with an adherent clot (Forrest Class IIb), treated with a heater probe.  No colonoscopy was performed.  The patient was admitted to [facility] from 08/07/2017 - 08/09/2017 with a GI bleed.  She presented with fatigue and weakness.  EGD on 08/08/2017 by Dr. Allen Norris revealed gastritis.  She received 2 units of PRBCs.  Hemoglobin was 8.6 on discharge.  The patient was admitted to [facility] from 09/26/2017 - 09/30/2017 with an upper GI bleed and chest pain.  She presented with shortness of breath, weakness, and fatigue.  Hemoglobin was 5. She described melena.  She received 2-4 units of PRBCs.  Hemoglobin improved to 7.5.  She was seen by Dr. Alice Reichert.  EGD on 09/28/2017 was normal.  Colonoscopy on 09/28/2017 showed internal hemorrhoids and a 5 mm polyp in the sigmoid colon.  Anemia work-up on 09/29/2017:  Ferritin was 13.  Iron saturation was 5% with a TIBC of 291.  B12 was 286.  Folate was 4.4 (> 5.9).  She was discharged on ferrous sulfate and folic acid.  The patient was admitted to [facility] from 11/03/2017 - 11/06/2017 with symptomatic anemia.  Capsule study on 11/04/2017 revealed 2 nonbleeding AVMs.  Small bowel endoscopy by Dr. Vicente Males on 11/06/2017 revealed a single non-bleeding angioectasia in the jejunum and 3  bleeding angioectasias in the greater curvature of the gastric body treated with argon plasma anticoagulation.  She was seen by Dr. Bonna Gains on 11/23/2017.  She was taking oral iron BID.  Consideration was made for IV iron.  CBC on 11/06/2017 revealed a hematocrit of 30.7, hemoglobin 8.7, MCV 75.2, platelets 169,000, and WBC 7000.  Hemoglobin was 7.3 on 11/23/2017.   Past Medical History:  Diagnosis Date  . CHF (congestive heart failure) ([place])   . COPD (chronic obstructive pulmonary disease) ([place])   . Diabetes mellitus without complication ([place])   . NSTEMI (non-ST elevated myocardial infarction) [facility])     Past Surgical History:  Procedure Laterality Date  . APPENDECTOMY    . CARDIAC CATHETERIZATION    . CHOLECYSTECTOMY    . COLONOSCOPY N/A 09/28/2017   Procedure: COLONOSCOPY;  Surgeon: Toledo, Benay Pike, MD;  Location: [facility] ENDOSCOPY;  Service: Gastroenterology;  Laterality: N/A;  . ENTEROSCOPY Left 11/06/2017   Procedure: ENTEROSCOPY;  Surgeon: Jonathon Bellows, MD;  Location: [facility] ENDOSCOPY;  Service: Gastroenterology;  Laterality: Left;  . ESOPHAGOGASTRODUODENOSCOPY N/A 12/19/2016   Procedure: ESOPHAGOGASTRODUODENOSCOPY (EGD);  Surgeon: Lin Landsman, MD;  Location: [facility] ENDOSCOPY;  Service: Gastroenterology;  Laterality: N/A;  . ESOPHAGOGASTRODUODENOSCOPY N/A 09/28/2017   Procedure: ESOPHAGOGASTRODUODENOSCOPY (EGD);  Surgeon: Toledo, Benay Pike, MD;  Location: [facility] ENDOSCOPY;  Service: Gastroenterology;  Laterality: N/A;  . ESOPHAGOGASTRODUODENOSCOPY (EGD) WITH PROPOFOL N/A 08/08/2017   Procedure: ESOPHAGOGASTRODUODENOSCOPY (EGD) WITH PROPOFOL;  Surgeon: [name], MD;  Location: [facility] ENDOSCOPY;  Service: Endoscopy;  Laterality: N/A;  . LEFT HEART  CATH AND CORONARY ANGIOGRAPHY N/A 12/23/2016   Procedure: LEFT HEART CATH AND CORONARY ANGIOGRAPHY;  Surgeon: Teodoro Spray, MD;  Location: [place] CV LAB;  Service: Cardiovascular;  Laterality: N/A;    Family History  Problem  Relation Age of Onset  . Hypertension Mother   . Diabetes Mellitus II Mother   . Heart disease Father     Social History:  reports that she has been smoking cigarettes. She has been smoking about 0.25 packs per day. She has never used smokeless tobacco. She reports that she does not drink alcohol or use drugs.  The patient is accompanied by *** alone today.  Allergies:  Allergies  Allergen Reactions  . Latex Anaphylaxis and Rash  . Gabapentin Nausea And Vomiting and Other (See Comments)    Current Medications: Current Outpatient Medications  Medication Sig Dispense Refill  . atorvastatin (LIPITOR) 40 MG tablet Take 1 tablet (40 mg total) by mouth daily. 30 tablet 3  . blood glucose meter kit and supplies KIT Dispense based on patient and insurance preference. Use up to four times daily as directed. (FOR ICD-9 250.00, 250.01). 1 each 0  . carvedilol (COREG) 3.125 MG tablet Take 1 tablet (3.125 mg total) by mouth 2 (two) times daily with a meal. 60 tablet 0  . ferrous sulfate 325 (65 FE) MG tablet Take 1 tablet (325 mg total) by mouth 2 (two) times daily with a meal. 30 tablet 3  . folic acid (FOLVITE) 1 MG tablet Take 1 tablet (1 mg total) by mouth daily. 30 tablet 0  . furosemide (LASIX) 40 MG tablet Take 1 tablet (40 mg total) by mouth 2 (two) times daily. 60 tablet 11  . Insulin Glargine (LANTUS SOLOSTAR) 100 UNIT/ML Solostar Pen Inject 15 Units into the skin daily. (Patient taking differently: Inject 16 Units into the skin daily. ) 300 mL 0  . Insulin Pen Needle 32G X 5 MM MISC 1 Units by Does not apply route daily. 100 each 0  . omeprazole (PRILOSEC) 40 MG capsule Take 1 capsule (40 mg total) by mouth daily. 30 capsule 11  . ondansetron (ZOFRAN) 4 MG tablet Take 1 tablet (4 mg total) by mouth every 6 (six) hours as needed for nausea. (Patient not taking: Reported on 11/23/2017) 20 tablet 0  . pregabalin (LYRICA) 100 MG capsule Take 100-200 mg by mouth See admin instructions. Take 1  capsule (100MG) by mouth every morning and 2 capsules (200MG) by mouth every night    . PROAIR HFA 108 (90 Base) MCG/ACT inhaler Inhale 1-2 puffs into the lungs every 4 (four) hours as needed.  2  . SYMBICORT 160-4.5 MCG/ACT inhaler Inhale 2 puffs into the lungs 2 (two) times daily.  2   No current facility-administered medications for this visit.     Review of Systems:  GENERAL:  Feels good.  Active.  No fevers, sweats or weight loss. PERFORMANCE STATUS (ECOG):  *** HEENT:  No visual changes, runny nose, sore throat, mouth sores or tenderness. Lungs: No shortness of breath or cough.  No hemoptysis. Cardiac:  No chest pain, palpitations, orthopnea, or PND. GI:  No nausea, vomiting, diarrhea, constipation, melena or hematochezia. GU:  No urgency, frequency, dysuria, or hematuria. Musculoskeletal:  No back pain.  No joint pain.  No muscle tenderness. Extremities:  No pain or swelling. Skin:  No rashes or skin changes. Neuro:  No headache, numbness or weakness, balance or coordination issues. Endocrine:  No diabetes, thyroid issues, hot flashes or  night sweats. Psych:  No mood changes, depression or anxiety. Pain:  No focal pain. Review of systems:  All other systems reviewed and found to be negative.  Physical Exam: There were no vitals taken for this visit. GENERAL:  Well developed, well nourished, **man sitting comfortably in the exam room in no acute distress. MENTAL STATUS:  Alert and oriented to person, place and time. HEAD:  *** hair.  Normocephalic, atraumatic, face symmetric, no Cushingoid features. EYES:  *** eyes.  Pupils equal round and reactive to light and accomodation.  No conjunctivitis or scleral icterus. ENT:  Oropharynx clear without lesion.  Tongue normal. Mucous membranes moist.  RESPIRATORY:  Clear to auscultation without rales, wheezes or rhonchi. CARDIOVASCULAR:  Regular rate and rhythm without murmur, rub or gallop. ABDOMEN:  Soft, non-tender, with active bowel  sounds, and no hepatosplenomegaly.  No masses. SKIN:  No rashes, ulcers or lesions. EXTREMITIES: No edema, no skin discoloration or tenderness.  No palpable cords. LYMPH NODES: No palpable cervical, supraclavicular, axillary or inguinal adenopathy  NEUROLOGICAL: Unremarkable. PSYCH:  Appropriate.   No visits with results within 3 Day(s) from this visit.  Latest known visit with results is:  Office Visit on 11/23/2017  Component Date Value Ref Range Status  . Hemoglobin 11/23/2017 7.3* 11.1 - 15.9 g/dL Final    Assessment:  [name] is a 63 y.o. 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  Ferritin has been followed: 13 on 09/29/2017.  She has been transfused with x units of PRBCs.  She is on oral iron.  She has B12 deficiency and folate deficiency.  Plan: 1.  Labs today:  CBC with diff, retic, ferritin, B12, folate, hold tube. 2.   3.   [place], MD  12/07/2017, 5:37 AM   I saw and evaluated the patient, participating in the key portions of the service and reviewing pertinent diagnostic studies and records.  I reviewed the nurse practitioner's note and agree with the findings and the plan.  The assessment and plan were discussed with the patient.  Additional diagnostic studies of *** are needed to clarify *** and would change the clinical management.  A few ***multiple questions were asked by the patient and answered.   Nolon Stalls, MD 12/07/2017,5:37 AM

## 2017-12-07 NOTE — Progress Notes (Signed)
[facility] day:  12/07/2017  Chief Complaint: [name] is a 62 y.o. female with iron deficiency who is referred in consultation by Dr. Bonna Gains for assessment and management.  HPI:  The patient was admitted to [facility] from 12/18/2016 - 12/26/2016 with acute respiratory failure with hypoxia, pneumonia, CHF exacerbation.  She had a NSTEMI.  Cardiac catheterization showing 90% proximal LAD stenosis, 80% proximal circumflex stenosis and 100% RCA stenosis with collaterals from left.-3 vessel CABG recommended.  She developed coffee ground emesis.  EGD on 12/19/2016 by Dr. Marius Ditch revealed blood clot in the gastric body.  There were non-bleeding gastric ulcers with an adherent clot (Forrest Class IIb), treated with a heater probe.  No colonoscopy was performed.  The patient underwent open heart surgery in [place] in 01/2017.  The patient was admitted to [facility] from 08/07/2017 - 08/09/2017 with a GI bleed.  She presented with fatigue and weakness.  EGD on 08/08/2017 by Dr. Allen Norris revealed gastritis.  She received 2 units of PRBCs.  Hemoglobin was 8.6 on discharge.  She began oral iron BID.  The patient was admitted to [facility] from 09/26/2017 - 09/30/2017 with an upper GI bleed and chest pain.  She presented with shortness of breath, weakness, and fatigue.  Hemoglobin was 5. She described melena.  She received 2-4 units of PRBCs.  Hemoglobin improved to 7.5.  She was seen by Dr. Alice Reichert.  EGD on 09/28/2017 was normal.  Colonoscopy on 09/28/2017 showed internal hemorrhoids and a 5 mm polyp in the sigmoid colon.  Anemia work-up on 09/29/2017:  Ferritin was 13.  Iron saturation was 5% with a TIBC of 291.  B12 was 286.  Folate was 4.4 (> 5.9).  She was discharged on ferrous sulfate and folic acid 1 mg/day.  The patient was admitted to [facility] from 11/03/2017 - 11/06/2017 with symptomatic anemia.  Capsule study on 11/04/2017 revealed 2 nonbleeding AVMs.  Small bowel  endoscopy by Dr. Vicente Males on 11/06/2017 revealed a single non-bleeding angioectasia in the jejunum and 3 bleeding angioectasias in the greater curvature of the gastric body treated with argon plasma anticoagulation.  She was seen by Dr. Bonna Gains on 11/23/2017.  She was taking oral iron BID.  Consideration was made for IV iron.  CBC on 11/06/2017 revealed a hematocrit of 30.7, hemoglobin 8.7, MCV 75.2, platelets 169,000, and WBC 7000.  Hemoglobin was 7.3 on 11/23/2017.  Symptomatically, she is fatigued.  She rests most of the day.  She has chronic shortness of breath.  She has a 10 pack year smoking history (currently 2 cigarettes/day).  She lost 100 pounds since her surgery, but recently has gained 23 pounds back.  She is eating better.  Patient's mother had breast cancer.  The patient has not had a mammogram in 2 years.   Past Medical History:  Diagnosis Date  . Cervical cancer ([place])   . CHF (congestive heart failure) ([place])   . COPD (chronic obstructive pulmonary disease) ([place])   . Diabetes mellitus without complication ([place])   . NSTEMI (non-ST elevated myocardial infarction) [facility])     Past Surgical History:  Procedure Laterality Date  . APPENDECTOMY    . CARDIAC CATHETERIZATION    . CHOLECYSTECTOMY    . COLONOSCOPY N/A 09/28/2017   Procedure: COLONOSCOPY;  Surgeon: Toledo, Benay Pike, MD;  Location: [facility] ENDOSCOPY;  Service: Gastroenterology;  Laterality: N/A;  . ENTEROSCOPY Left 11/06/2017   Procedure: ENTEROSCOPY;  Surgeon: Jonathon Bellows, MD;  Location: [facility]  ENDOSCOPY;  Service: Gastroenterology;  Laterality: Left;  . ESOPHAGOGASTRODUODENOSCOPY N/A 12/19/2016   Procedure: ESOPHAGOGASTRODUODENOSCOPY (EGD);  Surgeon: Lin Landsman, MD;  Location: [facility] ENDOSCOPY;  Service: Gastroenterology;  Laterality: N/A;  . ESOPHAGOGASTRODUODENOSCOPY N/A 09/28/2017   Procedure: ESOPHAGOGASTRODUODENOSCOPY (EGD);  Surgeon: Toledo, Benay Pike, MD;  Location: [facility] ENDOSCOPY;  Service: Gastroenterology;   Laterality: N/A;  . ESOPHAGOGASTRODUODENOSCOPY (EGD) WITH PROPOFOL N/A 08/08/2017   Procedure: ESOPHAGOGASTRODUODENOSCOPY (EGD) WITH PROPOFOL;  Surgeon: [name], MD;  Location: [facility] ENDOSCOPY;  Service: Endoscopy;  Laterality: N/A;  . LEFT HEART CATH AND CORONARY ANGIOGRAPHY N/A 12/23/2016   Procedure: LEFT HEART CATH AND CORONARY ANGIOGRAPHY;  Surgeon: Teodoro Spray, MD;  Location: [place] CV LAB;  Service: Cardiovascular;  Laterality: N/A;    Family History  Problem Relation Age of Onset  . Hypertension Mother   . Diabetes Mellitus II Mother   . Cancer Mother   . Heart disease Father   . Cancer Sister     Social History:  reports that she has been smoking cigarettes. She has been smoking about 0.25 packs per day. She has never used smokeless tobacco. She reports that she does not drink alcohol or use drugs.  She smokes 2 cigarettes/day (previously 1/2 ppd x 20 years).  She lives with her husband in [place].  She states that her husband cooks, cleans, and washes the clothes.  She sits in her bedroom during the day.  She is able to perform her ADLs.  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  The patient is alone today.  Allergies:  Allergies  Allergen Reactions  . Latex Anaphylaxis and Rash  . Gabapentin Nausea And Vomiting and Other (See Comments)    Current Medications: Current Outpatient Medications  Medication Sig Dispense Refill  . atorvastatin (LIPITOR) 40 MG tablet Take 1 tablet (40 mg total) by mouth daily. 30 tablet 3  . blood glucose meter kit and supplies KIT Dispense based on patient and insurance preference. Use up to four times daily as directed. (FOR ICD-9 250.00, 250.01). 1 each 0  . carvedilol (COREG) 3.125 MG tablet Take 1 tablet (3.125 mg total) by mouth 2 (two) times daily with a meal. 60 tablet 0  . ferrous sulfate 325 (65 FE) MG tablet Take 1 tablet (325 mg total) by mouth 2 (two) times daily with a meal. 30 tablet 3  . folic acid (FOLVITE)  1 MG tablet Take 1 tablet (1 mg total) by mouth daily. 30 tablet 0  . furosemide (LASIX) 40 MG tablet Take 1 tablet (40 mg total) by mouth 2 (two) times daily. 60 tablet 11  . Insulin Glargine (LANTUS SOLOSTAR) 100 UNIT/ML Solostar Pen Inject 15 Units into the skin daily. (Patient taking differently: Inject 16 Units into the skin daily. ) 300 mL 0  . Insulin Pen Needle 32G X 5 MM MISC 1 Units by Does not apply route daily. 100 each 0  . pregabalin (LYRICA) 100 MG capsule Take 100-200 mg by mouth See admin instructions. Take 1 capsule (100MG) by mouth every morning and 2 capsules (200MG) by mouth every night    . PROAIR HFA 108 (90 Base) MCG/ACT inhaler Inhale 1-2 puffs into the lungs every 4 (four) hours as needed.  2  . SYMBICORT 160-4.5 MCG/ACT inhaler Inhale 2 puffs into the lungs 2 (two) times daily.  2  . omeprazole (PRILOSEC) 40 MG capsule Take 1 capsule (40 mg total) by mouth daily. (Patient not taking: Reported on 12/07/2017) 30 capsule 11  .  ondansetron (ZOFRAN) 4 MG tablet Take 1 tablet (4 mg total) by mouth every 6 (six) hours as needed for nausea. (Patient not taking: Reported on 11/23/2017) 20 tablet 0   No current facility-administered medications for this visit.     Review of Systems:  GENERAL:  Feels "not good".  No fevers, sweats.  Weight loss of 100+ pounds after surgery.  Recent increase in weight from 162 pounds to 185 pounds. PERFORMANCE STATUS (ECOG):  2 HEENT:  No visual changes, runny nose, sore throat, mouth sores or tenderness. Lungs: Shortness of breath.  Cough.  No hemoptysis. Cardiac:  No chest pain, palpitations, orthopnea, or PND. GI:  Eating better.  No nausea, vomiting, diarrhea, constipation, melena or hematochezia.  No ice pica. GU:  No urgency, frequency, dysuria, or hematuria. Musculoskeletal:  No back pain.  No joint pain.  No muscle tenderness. Extremities:  No pain or swelling. Skin:  Pruritic back since 07/2017. Neuro:  "Barely walking secondary to  fatigue".  No headache, numbness or weakness, balance or coordination issues.  No restless legs.   Endocrine:  Diabetes.  No thyroid issues, hot flashes or night sweats. Psych:  No mood changes, depression or anxiety. Pain:  No focal pain. Review of systems:  All other systems reviewed and found to be negative.  Physical Exam: Blood pressure (!) 164/90, pulse 91, temperature 97.8 F (36.6 C), temperature source Tympanic, resp. rate 18, height 5' 5"  (1.651 m), weight 185 lb 4 oz (84 kg). GENERAL:  Well developed, well nourished, woman sitting comfortably in a wheelchair in the exam room in no acute distress. MENTAL STATUS:  Alert and oriented to person, place and time. HEAD:  Black hair with graying.  Normocephalic, atraumatic, face symmetric, no Cushingoid features. EYES:  Black rimmed glasses.  Brown eyes.  Pupils equal round and reactive to light and accomodation.  No conjunctivitis or scleral icterus. ENT:  Oropharynx clear without lesion.  Tongue normal. Mucous membranes moist.  RESPIRATORY:  Clear to auscultation without rales, wheezes or rhonchi. CARDIOVASCULAR:  Regular rate and rhythm without murmur, rub or gallop. CHEST WALL:  Median sternotomy incision. ABDOMEN:  Soft, non-tender, with active bowel sounds, and no hepatosplenomegaly.  No masses. SKIN:  Long nails. Acne like changes on back with hyperpigmentation.   No ulcers or lesions. EXTREMITIES: 2-3+ chronic lower extremity edema.  No skin discoloration or tenderness.  No palpable cords. LYMPH NODES: No palpable cervical, supraclavicular, axillary or inguinal adenopathy  NEUROLOGICAL: Unremarkable. PSYCH:  Appropriate.   No visits with results within 3 Day(s) from this visit.  Latest known visit with results is:  Office Visit on 11/23/2017  Component Date Value Ref Range Status  . Hemoglobin 11/23/2017 7.3* 11.1 - 15.9 g/dL Final    Assessment:  [name] is a 62 y.o. female with iron deficiency anemia secondary  to small bowel and gastric AVMs.  Ferritin has been followed: 13 on 09/29/2017.  She has been transfused with x units of PRBCs.  She is on oral iron.  EGD on 09/28/2017 was normal.  Colonoscopy on 09/28/2017 showed internal hemorrhoids and a 5 mm polyp in the sigmoid colon.  Capsule study on 11/04/2017 revealed 2 nonbleeding AVMs.   Small bowel endoscopy on 11/06/2017 revealed a single non-bleeding angioectasia in the jejunum and 3 bleeding angioectasias in the greater curvature of the gastric body treated with argon plasma anticoagulation.  She has B12 deficiency and folate deficiency.  B12 was 286 and folate 4.4 (> 5.9) on 09/29/2017.  Symptomatically, she is extremely fatigued.  She rests most of the day.  She has chronic shortness of breath.  She lost 100 pounds after her surgery, but has gained 23 pounds in the recent past.  Exam is unremarkable.  Hemoglobin is 7.2.  Ferritin is 16.  Plan: 1.  Labs today:  CBC with diff, retic, ferritin, B12, folate, hold tube. 2.  Iron deficiency anemia:  Symptomatic anemia.  Venofer preauthorized.  Discussed transfusion.  Venofer weekly x 4 (first infusion today).  RTC tomorrow for labs (CBC) and 1 unit of PRBCs.  Patient consented. 3.  B12 deficiency:  Begin B12 weekly x 6 then monthly. 4.  Folate deficiency:  Continue folic acid 1 mg a day. 5.  Health maintenance:  Discuss need for bilateral mammogram (none in 2 years).  Discuss smoking cessation. 6.  RTC in 1 month for labs (CBC with diff, ferritin-day before), and +/- Venofer.   Lequita Asal, MD  12/07/2017, 11:46 AM

## 2017-12-08 ENCOUNTER — Other Ambulatory Visit: Payer: Self-pay

## 2017-12-08 ENCOUNTER — Inpatient Hospital Stay: Payer: Medicaid Other | Admitting: Hematology and Oncology

## 2017-12-08 ENCOUNTER — Inpatient Hospital Stay: Payer: Medicaid Other

## 2017-12-08 DIAGNOSIS — D500 Iron deficiency anemia secondary to blood loss (chronic): Secondary | ICD-10-CM

## 2017-12-08 DIAGNOSIS — K922 Gastrointestinal hemorrhage, unspecified: Secondary | ICD-10-CM

## 2017-12-08 LAB — CBC WITH DIFFERENTIAL/PLATELET
Abs Immature Granulocytes: 0.01 10*3/uL (ref 0.00–0.07)
BASOS ABS: 0.1 10*3/uL (ref 0.0–0.1)
Basophils Relative: 1 %
EOS ABS: 0.1 10*3/uL (ref 0.0–0.5)
Eosinophils Relative: 1 %
HEMATOCRIT: 25.2 % — AB (ref 36.0–46.0)
Hemoglobin: 6.9 g/dL — ABNORMAL LOW (ref 12.0–15.0)
Immature Granulocytes: 0 %
Lymphocytes Relative: 21 %
Lymphs Abs: 1 10*3/uL (ref 0.7–4.0)
MCH: 19.9 pg — AB (ref 26.0–34.0)
MCHC: 27.4 g/dL — AB (ref 30.0–36.0)
MCV: 72.8 fL — ABNORMAL LOW (ref 80.0–100.0)
MONO ABS: 0.4 10*3/uL (ref 0.1–1.0)
Monocytes Relative: 8 %
NRBC: 0 % (ref 0.0–0.2)
Neutro Abs: 3.4 10*3/uL (ref 1.7–7.7)
Neutrophils Relative %: 69 %
Platelets: 189 10*3/uL (ref 150–400)
RBC: 3.46 MIL/uL — AB (ref 3.87–5.11)
RDW: 22.1 % — AB (ref 11.5–15.5)
WBC: 4.9 10*3/uL (ref 4.0–10.5)

## 2017-12-08 MED ORDER — DIPHENHYDRAMINE HCL 25 MG PO CAPS
25.0000 mg | ORAL_CAPSULE | Freq: Once | ORAL | Status: AC
  Administered 2017-12-08: 25 mg via ORAL
  Filled 2017-12-08: qty 1

## 2017-12-08 MED ORDER — SODIUM CHLORIDE 0.9% IV SOLUTION
250.0000 mL | Freq: Once | INTRAVENOUS | Status: AC
  Administered 2017-12-08: 250 mL via INTRAVENOUS
  Filled 2017-12-08: qty 250

## 2017-12-08 MED ORDER — ACETAMINOPHEN 325 MG PO TABS
650.0000 mg | ORAL_TABLET | Freq: Once | ORAL | Status: AC
  Administered 2017-12-08: 650 mg via ORAL
  Filled 2017-12-08: qty 2

## 2017-12-09 LAB — BPAM RBC
Blood Product Expiration Date: 201912222359
ISSUE DATE / TIME: 201911221010
UNIT TYPE AND RH: 5100

## 2017-12-09 LAB — TYPE AND SCREEN
ABO/RH(D): O POS
Antibody Screen: NEGATIVE
Unit division: 0

## 2017-12-10 ENCOUNTER — Encounter: Payer: Self-pay | Admitting: Hematology and Oncology

## 2017-12-11 NOTE — Progress Notes (Signed)
This encounter was created in error - please disregard.

## 2017-12-13 ENCOUNTER — Inpatient Hospital Stay: Payer: Medicaid Other

## 2017-12-18 ENCOUNTER — Encounter: Payer: Medicaid Other | Admitting: Hematology and Oncology

## 2017-12-21 ENCOUNTER — Inpatient Hospital Stay: Payer: Medicaid Other | Attending: Hematology and Oncology

## 2017-12-21 DIAGNOSIS — E538 Deficiency of other specified B group vitamins: Secondary | ICD-10-CM | POA: Insufficient documentation

## 2017-12-21 DIAGNOSIS — Z79899 Other long term (current) drug therapy: Secondary | ICD-10-CM | POA: Insufficient documentation

## 2017-12-21 DIAGNOSIS — F418 Other specified anxiety disorders: Secondary | ICD-10-CM | POA: Insufficient documentation

## 2017-12-21 DIAGNOSIS — E669 Obesity, unspecified: Secondary | ICD-10-CM | POA: Insufficient documentation

## 2017-12-21 DIAGNOSIS — Z8719 Personal history of other diseases of the digestive system: Secondary | ICD-10-CM | POA: Insufficient documentation

## 2017-12-21 DIAGNOSIS — R42 Dizziness and giddiness: Secondary | ICD-10-CM | POA: Insufficient documentation

## 2017-12-21 DIAGNOSIS — R600 Localized edema: Secondary | ICD-10-CM | POA: Insufficient documentation

## 2017-12-21 DIAGNOSIS — I252 Old myocardial infarction: Secondary | ICD-10-CM | POA: Insufficient documentation

## 2017-12-21 DIAGNOSIS — Z803 Family history of malignant neoplasm of breast: Secondary | ICD-10-CM | POA: Insufficient documentation

## 2017-12-21 DIAGNOSIS — Z951 Presence of aortocoronary bypass graft: Secondary | ICD-10-CM | POA: Insufficient documentation

## 2017-12-21 DIAGNOSIS — D500 Iron deficiency anemia secondary to blood loss (chronic): Secondary | ICD-10-CM | POA: Insufficient documentation

## 2017-12-21 DIAGNOSIS — Q2733 Arteriovenous malformation of digestive system vessel: Secondary | ICD-10-CM | POA: Insufficient documentation

## 2017-12-21 DIAGNOSIS — R531 Weakness: Secondary | ICD-10-CM | POA: Insufficient documentation

## 2017-12-21 DIAGNOSIS — R5383 Other fatigue: Secondary | ICD-10-CM | POA: Insufficient documentation

## 2017-12-21 DIAGNOSIS — R0609 Other forms of dyspnea: Secondary | ICD-10-CM | POA: Insufficient documentation

## 2017-12-21 DIAGNOSIS — J449 Chronic obstructive pulmonary disease, unspecified: Secondary | ICD-10-CM | POA: Insufficient documentation

## 2017-12-27 ENCOUNTER — Inpatient Hospital Stay (HOSPITAL_BASED_OUTPATIENT_CLINIC_OR_DEPARTMENT_OTHER): Payer: Medicaid Other | Admitting: Oncology

## 2017-12-27 ENCOUNTER — Inpatient Hospital Stay: Payer: Medicaid Other | Attending: Hematology and Oncology

## 2017-12-27 VITALS — BP 161/84 | HR 83 | Temp 96.0°F | Resp 18

## 2017-12-27 DIAGNOSIS — E669 Obesity, unspecified: Secondary | ICD-10-CM | POA: Diagnosis not present

## 2017-12-27 DIAGNOSIS — I252 Old myocardial infarction: Secondary | ICD-10-CM | POA: Diagnosis not present

## 2017-12-27 DIAGNOSIS — R531 Weakness: Secondary | ICD-10-CM | POA: Diagnosis not present

## 2017-12-27 DIAGNOSIS — Z8719 Personal history of other diseases of the digestive system: Secondary | ICD-10-CM | POA: Diagnosis not present

## 2017-12-27 DIAGNOSIS — D500 Iron deficiency anemia secondary to blood loss (chronic): Secondary | ICD-10-CM

## 2017-12-27 DIAGNOSIS — Z79899 Other long term (current) drug therapy: Secondary | ICD-10-CM | POA: Diagnosis not present

## 2017-12-27 DIAGNOSIS — R42 Dizziness and giddiness: Secondary | ICD-10-CM

## 2017-12-27 DIAGNOSIS — E538 Deficiency of other specified B group vitamins: Secondary | ICD-10-CM | POA: Insufficient documentation

## 2017-12-27 DIAGNOSIS — R600 Localized edema: Secondary | ICD-10-CM

## 2017-12-27 DIAGNOSIS — Q2733 Arteriovenous malformation of digestive system vessel: Secondary | ICD-10-CM | POA: Insufficient documentation

## 2017-12-27 DIAGNOSIS — J449 Chronic obstructive pulmonary disease, unspecified: Secondary | ICD-10-CM

## 2017-12-27 DIAGNOSIS — Z803 Family history of malignant neoplasm of breast: Secondary | ICD-10-CM | POA: Diagnosis not present

## 2017-12-27 DIAGNOSIS — F418 Other specified anxiety disorders: Secondary | ICD-10-CM | POA: Diagnosis not present

## 2017-12-27 DIAGNOSIS — R5383 Other fatigue: Secondary | ICD-10-CM | POA: Insufficient documentation

## 2017-12-27 DIAGNOSIS — D649 Anemia, unspecified: Secondary | ICD-10-CM

## 2017-12-27 DIAGNOSIS — Z951 Presence of aortocoronary bypass graft: Secondary | ICD-10-CM

## 2017-12-27 DIAGNOSIS — R0609 Other forms of dyspnea: Secondary | ICD-10-CM | POA: Diagnosis not present

## 2017-12-27 LAB — CBC WITH DIFFERENTIAL/PLATELET
ABS IMMATURE GRANULOCYTES: 0.02 10*3/uL (ref 0.00–0.07)
BASOS ABS: 0.1 10*3/uL (ref 0.0–0.1)
Basophils Relative: 1 %
EOS ABS: 0.1 10*3/uL (ref 0.0–0.5)
EOS PCT: 1 %
HCT: 31.9 % — ABNORMAL LOW (ref 36.0–46.0)
HEMOGLOBIN: 9 g/dL — AB (ref 12.0–15.0)
Immature Granulocytes: 0 %
Lymphocytes Relative: 18 %
Lymphs Abs: 1 10*3/uL (ref 0.7–4.0)
MCH: 21.6 pg — AB (ref 26.0–34.0)
MCHC: 28.2 g/dL — ABNORMAL LOW (ref 30.0–36.0)
MCV: 76.5 fL — AB (ref 80.0–100.0)
Monocytes Absolute: 0.4 10*3/uL (ref 0.1–1.0)
Monocytes Relative: 6 %
NEUTROS PCT: 74 %
Neutro Abs: 4.1 10*3/uL (ref 1.7–7.7)
Platelets: 168 10*3/uL (ref 150–400)
RBC: 4.17 MIL/uL (ref 3.87–5.11)
RDW: 24 % — ABNORMAL HIGH (ref 11.5–15.5)
WBC: 5.6 10*3/uL (ref 4.0–10.5)
nRBC: 0 % (ref 0.0–0.2)

## 2017-12-27 MED ORDER — IRON SUCROSE 20 MG/ML IV SOLN
200.0000 mg | Freq: Once | INTRAVENOUS | Status: AC
  Administered 2017-12-27: 200 mg via INTRAVENOUS
  Filled 2017-12-27: qty 10

## 2017-12-27 MED ORDER — CYANOCOBALAMIN 1000 MCG/ML IJ SOLN
1000.0000 ug | Freq: Once | INTRAMUSCULAR | Status: AC
  Administered 2017-12-27: 1000 ug via INTRAMUSCULAR
  Filled 2017-12-27: qty 1

## 2017-12-27 MED ORDER — SODIUM CHLORIDE 0.9 % IV SOLN
Freq: Once | INTRAVENOUS | Status: AC
  Administered 2017-12-27: 14:00:00 via INTRAVENOUS
  Filled 2017-12-27: qty 250

## 2017-12-27 NOTE — Progress Notes (Signed)
Pt reports fatigue and swelling in the feet have not resolved since last visit with MD. Per Honor Loh NP proceed with treatment as scheduled, Sonia Baller NP at chairside to assess pt and review symptoms with pt. CBC drawn per Sonia Baller NP order. Pt aware of CBC results and states she does not want to wait for Sonia Baller NP to review results and plan, that she is ready to go. Pt stable at discharge. Sonia Baller NP aware of patients CBC results and patient's decision to be discharged. NNO at this time.

## 2017-12-27 NOTE — Progress Notes (Signed)
Symptom Management Consult note [facility]  [phone] [phone]  Patient Care Team: Patient, No Pcp Per as PCP - General (General Practice) Virgel Manifold, MD as Consulting Physician (Gastroenterology) Lin Landsman, MD as Consulting Physician (Gastroenterology)   Name of the patient: [name]  [id number]  04/14/1955   Date of visit: 12/27/2017  Diagnosis: IDA  Chief Complaint: Weakness/fatigue  Current Treatment: IV iron X 4, I62 X 6, Folic acid 1 mg daily  Oncology History:  The patient was admitted to [facility] from 12/18/2016 - 12/26/2016 with acute respiratory failure with hypoxia, pneumonia, CHF exacerbation.  She had a NSTEMI.  Cardiac catheterization showing 90% proximal LAD stenosis, 80% proximal circumflex stenosisand100% RCA stenosis with collaterals from left.-3 vessel CABG recommended.  She developed coffee ground emesis.  EGD on 12/19/2016 by Dr. Marius Ditch revealed blood clot in the gastric body.  There were non-bleeding gastric ulcers with an adherent clot (Forrest Class IIb), treated with a heater probe.  No colonoscopy was performed.  The patient underwent open heart surgery in [place] in 01/2017.  The patient was admitted to [facility] from 08/07/2017 - 08/09/2017 with a GI bleed.  She presented with fatigue and weakness.  EGD on 08/08/2017 by Dr. Allen Norris revealed gastritis.  She received 2 units of PRBCs.  Hemoglobin was 8.6 on discharge.  She began oral iron BID.  The patient was admitted to [facility] from 09/26/2017 - 09/30/2017 with an upper GI bleed and chest pain.  She presented with shortness of breath, weakness, and fatigue.  Hemoglobin was 5. She described melena.  She received 2-4 units of PRBCs.  Hemoglobin improved to 7.5.  She was seen by Dr. Alice Reichert.  EGD on 09/28/2017 was normal.  Colonoscopy on 09/28/2017 showed internal hemorrhoids and a 5 mm polyp in the sigmoid colon.  Anemia work-up on  09/29/2017:  Ferritin was 13.  Iron saturation was 5% with a TIBC of 291.  B12 was 286.  Folate was 4.4 (> 5.9).  She was discharged on ferrous sulfate and folic acid 1 mg/day.  The patient was admitted to [facility] from 11/03/2017 - 11/06/2017 with symptomatic anemia.  Capsule study on 11/04/2017 revealed 2 nonbleeding AVMs.  Small bowel endoscopy by Dr. Vicente Males on 11/06/2017 revealed a single non-bleeding angioectasia in the jejunum and 3 bleeding angioectasias in the greater curvature of the gastric body treated with argon plasma anticoagulation.  She was seen by Dr. Bonna Gains on 11/23/2017.  She was taking oral iron BID.  Consideration was made for IV iron.  CBC on 11/06/2017 revealed a hematocrit of 30.7, hemoglobin 8.7, MCV 75.2, platelets 169,000, and WBC 7000.  Hemoglobin was 7.3 on 11/23/2017.  Patient's mother had breast cancer.  The patient has not had a mammogram in 2 years.  Subjective Data:  ECOG: 2 - Symptomatic, <50% confined to bed   Subjective:     [name] is a 62 y.o. female who presents to [facility] for evaluation of symptomatic anemia.  Patient recently seen by hematologist Dr. Mike Gip on 12/07/2017 for work-up of iron deficiency anemia. Anemia was found after several episodes of symptomatic anemia where she  presented to [facility] emergency room requiring blood transfusions.Has had both an EGD and colonoscopy revealing nonbleeding ulcers and hemorrhoids.  Had complete anemia work-up on 09/29/2017 revealing IDA.[name] Kitchen  Associated signs & symptoms: dizziness/lightheadedness, dyspnea, fatigue, numbness/paresthesias and BLE edema.  The following portions of the patient's history were reviewed and updated as appropriate: allergies, current medications, past  family history, past medical history, past social history, past surgical history and problem list. Review of Systems A comprehensive review of systems was negative except for: Constitutional: positive for fatigue and  malaise Respiratory: positive for dyspnea on exertion Cardiovascular: positive for dyspnea, fatigue and lower extremity edema Musculoskeletal: positive for arthralgias and muscle weakness Neurological: positive for dizziness and weakness Behavioral/Psych: positive for anxiety and depression        Objective:    There were no vitals taken for this visit. General appearance: alert, fatigued, moderate distress and mildly obese Lungs: clear to auscultation bilaterally Heart: regular rate and rhythm, S1, S2 normal, no murmur, click, rub or gallop Abdomen: soft, non-tender; bowel sounds normal; no masses,  no organomegaly    Assessment:    Iron deficiency anemia due to unknown etiology.    Plan:    IDA/vitamin B12/folic acid: Patient is status post 1 IV Venofer infusion (12/07/17), B12 injection (12/07/17) and 1 unit packed red blood cells (12/08/17).  Scheduled for second IV Venofer infusion today.  She continues to feel weak, fatigued and drained.  She is wanting answers as to why she continues to feel poorly given she has received initial treatment.  Spoke at length with patient about SE/symptoms of  iron deficiency, anemia, vitamin B12 deficiency and folic acid deficiency. Encouraged her to continue with treatments as hopefully she will begin to feel better when her levels normalize.  Patient agreeable with second IV Venofer infusion.  We will draw stat CBC to review current hemoglobin and her history.  Results reveal a positive response to recent iron infusions and packed red blood cells.  Current hemoglobin is 9.  Patient is encouraged by these results.  Her next scheduled IV iron infusion is on 01/04/2018 and 01/11/18.   Greater than 50% was spent in counseling and coordination of care with this patient including but not limited to discussion of the relevant topics above (See A&P) including, but not limited to diagnosis and management of acute and chronic medical conditions.    Faythe Casa, NP 12/29/2017 3:12 PM

## 2018-01-02 ENCOUNTER — Inpatient Hospital Stay: Payer: Medicaid Other

## 2018-01-03 ENCOUNTER — Ambulatory Visit: Payer: Medicaid Other

## 2018-01-03 ENCOUNTER — Other Ambulatory Visit: Payer: Self-pay | Admitting: *Deleted

## 2018-01-03 DIAGNOSIS — D500 Iron deficiency anemia secondary to blood loss (chronic): Secondary | ICD-10-CM

## 2018-01-04 ENCOUNTER — Inpatient Hospital Stay: Payer: Medicaid Other | Admitting: Hematology and Oncology

## 2018-01-04 ENCOUNTER — Inpatient Hospital Stay: Payer: Medicaid Other

## 2018-01-04 NOTE — Progress Notes (Deleted)
[facility] day:  01/04/2018  Chief Complaint: [name] is a 62 y.o. female with iron deficiency who is referred in consultation by Dr. Bonna Gains for assessment and management.  HPI:  The patient was last seen in the hematology clinic on 12/07/2017 for initial consultation.  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 Symptomatically, she was extremely fatigued.  She rested most of the day.  She had chronic shortness of breath.  She had lost 100 pounds after her surgery, but has gained 23 pounds in the recent past.  Exam was unremarkable.  Hemoglobin was 7.2.  Ferritin was 16.  B12 was 326.  Folate was 7.9.  Retic was 1%.  She received B12 and Venofer on 12/07/2017 and 12/27/2017.  She received 1 unit of PRBCs on 12/08/2017.  She was scheduled for PRBC transfusion the following day and weekly Venofer x 4.  She saw Rulon Abide, NP in the symptom management clinic on 12/11/18/2017.  Notes reviewed.  She was weak and fatigued.  She was encouraged to continue her treatments.  Labs had improved.  CBC on 12/27/2017 revealed a hematocrit of 31.9, hemoglobin 9.0, and MCV 76.5.   During the interim,   Past Medical History:  Diagnosis Date  . Cervical cancer ([place])   . CHF (congestive heart failure) ([place])   . COPD (chronic obstructive pulmonary disease) ([place])   . Diabetes mellitus without complication ([place])   . NSTEMI (non-ST elevated myocardial infarction) [facility])     Past Surgical History:  Procedure Laterality Date  . APPENDECTOMY    . CARDIAC CATHETERIZATION    . CHOLECYSTECTOMY    . COLONOSCOPY N/A 09/28/2017   Procedure: COLONOSCOPY;  Surgeon: Toledo, Benay Pike, MD;  Location: [facility] ENDOSCOPY;  Service: Gastroenterology;  Laterality: N/A;  . ENTEROSCOPY Left 11/06/2017   Procedure: ENTEROSCOPY;  Surgeon: Jonathon Bellows, MD;  Location: [facility] ENDOSCOPY;  Service: Gastroenterology;  Laterality: Left;  .  ESOPHAGOGASTRODUODENOSCOPY N/A 12/19/2016   Procedure: ESOPHAGOGASTRODUODENOSCOPY (EGD);  Surgeon: Lin Landsman, MD;  Location: [facility] ENDOSCOPY;  Service: Gastroenterology;  Laterality: N/A;  . ESOPHAGOGASTRODUODENOSCOPY N/A 09/28/2017   Procedure: ESOPHAGOGASTRODUODENOSCOPY (EGD);  Surgeon: Toledo, Benay Pike, MD;  Location: [facility] ENDOSCOPY;  Service: Gastroenterology;  Laterality: N/A;  . ESOPHAGOGASTRODUODENOSCOPY (EGD) WITH PROPOFOL N/A 08/08/2017   Procedure: ESOPHAGOGASTRODUODENOSCOPY (EGD) WITH PROPOFOL;  Surgeon: [name], MD;  Location: [facility] ENDOSCOPY;  Service: Endoscopy;  Laterality: N/A;  . LEFT HEART CATH AND CORONARY ANGIOGRAPHY N/A 12/23/2016   Procedure: LEFT HEART CATH AND CORONARY ANGIOGRAPHY;  Surgeon: Teodoro Spray, MD;  Location: [facility] CV LAB;  Service: Cardiovascular;  Laterality: N/A;    Family History  Problem Relation Age of Onset  . Hypertension Mother   . Diabetes Mellitus II Mother   . Breast cancer Mother   . Heart disease Father   . Cancer Sister     Social History:  reports that she has been smoking cigarettes. She has been smoking about 0.25 packs per day. She has never used smokeless tobacco. She reports that she does not drink alcohol or use drugs.  She smokes 2 cigarettes/day (previously 1/2 ppd x 20 years).  She lives with her husband in [place].  She states that her husband cooks, cleans, and washes the clothes.  She sits in her bedroom during the day.  She is able to perform her ADLs.  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  The patient is alone today.  Allergies:  Allergies  Allergen Reactions  . Latex Anaphylaxis and Rash  . Gabapentin Nausea And Vomiting and Other (See Comments)    Current Medications: Current Outpatient Medications  Medication Sig Dispense Refill  . atorvastatin (LIPITOR) 40 MG tablet Take 1 tablet (40 mg total) by mouth daily. 30 tablet 3  . blood glucose meter kit and supplies KIT Dispense based  on patient and insurance preference. Use up to four times daily as directed. (FOR ICD-9 250.00, 250.01). 1 each 0  . carvedilol (COREG) 3.125 MG tablet Take 1 tablet (3.125 mg total) by mouth 2 (two) times daily with a meal. 60 tablet 0  . ferrous sulfate 325 (65 FE) MG tablet Take 1 tablet (325 mg total) by mouth 2 (two) times daily with a meal. 30 tablet 3  . folic acid (FOLVITE) 1 MG tablet Take 1 tablet (1 mg total) by mouth daily. 30 tablet 0  . furosemide (LASIX) 40 MG tablet Take 1 tablet (40 mg total) by mouth 2 (two) times daily. 60 tablet 11  . Insulin Glargine (LANTUS SOLOSTAR) 100 UNIT/ML Solostar Pen Inject 15 Units into the skin daily. (Patient taking differently: Inject 16 Units into the skin daily. ) 300 mL 0  . Insulin Pen Needle 32G X 5 MM MISC 1 Units by Does not apply route daily. 100 each 0  . omeprazole (PRILOSEC) 40 MG capsule Take 1 capsule (40 mg total) by mouth daily. (Patient not taking: Reported on 12/07/2017) 30 capsule 11  . ondansetron (ZOFRAN) 4 MG tablet Take 1 tablet (4 mg total) by mouth every 6 (six) hours as needed for nausea. (Patient not taking: Reported on 11/23/2017) 20 tablet 0  . pregabalin (LYRICA) 100 MG capsule Take 100-200 mg by mouth See admin instructions. Take 1 capsule (100MG) by mouth every morning and 2 capsules (200MG) by mouth every night    . PROAIR HFA 108 (90 Base) MCG/ACT inhaler Inhale 1-2 puffs into the lungs every 4 (four) hours as needed.  2  . SYMBICORT 160-4.5 MCG/ACT inhaler Inhale 2 puffs into the lungs 2 (two) times daily.  2   No current facility-administered medications for this visit.     Review of Systems:  GENERAL:  Feels "not good".  No fevers, sweats.  Weight loss of 100+ pounds after surgery.  Recent increase in weight from 162 pounds to 185 pounds. PERFORMANCE STATUS (ECOG):  2 HEENT:  No visual changes, runny nose, sore throat, mouth sores or tenderness. Lungs: Shortness of breath.  Cough.  No hemoptysis. Cardiac:  No  chest pain, palpitations, orthopnea, or PND. GI:  Eating better.  No nausea, vomiting, diarrhea, constipation, melena or hematochezia.  No ice pica. GU:  No urgency, frequency, dysuria, or hematuria. Musculoskeletal:  No back pain.  No joint pain.  No muscle tenderness. Extremities:  No pain or swelling. Skin:  Pruritic back since 07/2017. Neuro:  "Barely walking secondary to fatigue".  No headache, numbness or weakness, balance or coordination issues.  No restless legs.   Endocrine:  Diabetes.  No thyroid issues, hot flashes or night sweats. Psych:  No mood changes, depression or anxiety. Pain:  No focal pain. Review of systems:  All other systems reviewed and found to be negative.  Physical Exam: There were no vitals taken for this visit. GENERAL:  Well developed, well nourished, woman sitting comfortably in a wheelchair in the exam room in no acute distress. MENTAL STATUS:  Alert and oriented to person, place and time. HEAD:  Black hair with graying.  Normocephalic, atraumatic, face symmetric, no Cushingoid features. EYES:  Black rimmed glasses.  Brown eyes.  Pupils equal round and reactive to light and accomodation.  No conjunctivitis or scleral icterus. ENT:  Oropharynx clear without lesion.  Tongue normal. Mucous membranes moist.  RESPIRATORY:  Clear to auscultation without rales, wheezes or rhonchi. CARDIOVASCULAR:  Regular rate and rhythm without murmur, rub or gallop. CHEST WALL:  Median sternotomy incision. ABDOMEN:  Soft, non-tender, with active bowel sounds, and no hepatosplenomegaly.  No masses. SKIN:  Long nails. Acne like changes on back with hyperpigmentation.   No ulcers or lesions. EXTREMITIES: 2-3+ chronic lower extremity edema.  No skin discoloration or tenderness.  No palpable cords. LYMPH NODES: No palpable cervical, supraclavicular, axillary or inguinal adenopathy  NEUROLOGICAL: Unremarkable. PSYCH:  Appropriate.   No visits with results within 3 Day(s) from this  visit.  Latest known visit with results is:  Infusion on 12/27/2017  Component Date Value Ref Range Status  . WBC 12/27/2017 5.6  4.0 - 10.5 K/uL Final  . RBC 12/27/2017 4.17  3.87 - 5.11 MIL/uL Final  . Hemoglobin 12/27/2017 9.0* 12.0 - 15.0 g/dL Final   Comment: Reticulocyte Hemoglobin testing may be clinically indicated, consider ordering this additional test [id number]   . HCT 12/27/2017 31.9* 36.0 - 46.0 % Final  . MCV 12/27/2017 76.5* 80.0 - 100.0 fL Final  . MCH 12/27/2017 21.6* 26.0 - 34.0 pg Final  . MCHC 12/27/2017 28.2* 30.0 - 36.0 g/dL Final  . RDW 12/27/2017 24.0* 11.5 - 15.5 % Final  . Platelets 12/27/2017 168  150 - 400 K/uL Final  . nRBC 12/27/2017 0.0  0.0 - 0.2 % Final  . Neutrophils Relative % 12/27/2017 74  % Final  . Neutro Abs 12/27/2017 4.1  1.7 - 7.7 K/uL Final  . Lymphocytes Relative 12/27/2017 18  % Final  . Lymphs Abs 12/27/2017 1.0  0.7 - 4.0 K/uL Final  . Monocytes Relative 12/27/2017 6  % Final  . Monocytes Absolute 12/27/2017 0.4  0.1 - 1.0 K/uL Final  . Eosinophils Relative 12/27/2017 1  % Final  . Eosinophils Absolute 12/27/2017 0.1  0.0 - 0.5 K/uL Final  . Basophils Relative 12/27/2017 1  % Final  . Basophils Absolute 12/27/2017 0.1  0.0 - 0.1 K/uL Final  . Immature Granulocytes 12/27/2017 0  % Final  . Abs Immature Granulocytes 12/27/2017 0.02  0.00 - 0.07 K/uL Final   Performed at [facility], [address] [place], [place] [address]    Assessment:  [name] is a 62 y.o. 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  EGD on 09/28/2017 was normal.  Colonoscopy on 09/28/2017 showed internal hemorrhoids and a 5 mm polyp in the sigmoid colon.  Capsule study on 11/04/2017 revealed 2 nonbleeding AVMs.   Small bowel endoscopy on 11/06/2017 revealed a single non-bleeding angioectasia in the jejunum and 3 bleeding angioectasias in the greater curvature of the gastric body treated with argon plasma  anticoagulation.  She has received Venofer x 2 (12/07/2017 and 12/27/2017).  She received 1 unit of PRBCs on 12/08/2017.  Ferritin has been followed: 13 on 09/29/2017 and 16 on 12/07/2017.  She has been transfused with x units of PRBCs.  She is on oral iron.  She has B12 deficiency and folate deficiency.  B12 was 286 and folate 4.4 (> 5.9) on 09/29/2017.  She began weekly B12 on 12/07/2017 (last 12/27/2017).  Symptomatically, she is  extremely fatigued.  She rests most of the day.  She has chronic shortness of breath.  She lost 100 pounds after her surgery, but has gained 23 pounds in the recent past.  Exam is unremarkable.  Hemoglobin is 7.2.  Ferritin is 16.  Plan: 1.  Labs today:  CBC with diff, ferritin.  2.  Iron deficiency anemia:  Symptomatic anemia.  Venofer preauthorized.  Discussed transfusion.  Venofer weekly x 4 (first infusion today).  RTC tomorrow for labs (CBC) and 1 unit of PRBCs.  Patient consented. 3.  B12 deficiency:  Begin B12 weekly x 6 then monthly. 4.  Folate deficiency:  Continue folic acid 1 mg a day. 5.  Health maintenance:  Discuss need for bilateral mammogram (none in 2 years).  Discuss smoking cessation. 6.  RTC in 1 month for labs (CBC with diff, ferritin-day before), and +/- Venofer.   Lequita Asal, MD  01/04/2018, 6:02 AM

## 2018-01-11 ENCOUNTER — Inpatient Hospital Stay: Payer: Medicaid Other

## 2018-01-11 ENCOUNTER — Ambulatory Visit: Payer: Medicaid Other

## 2018-01-12 IMAGING — DX DG CHEST 1V PORT
1 series · 1 of 1 positions shown · non-contrast
Comparison: 12/18/2016 and prior radiograph

CLINICAL DATA: Central line placement.

EXAM:
PORTABLE CHEST 1 VIEW

[chest ap]
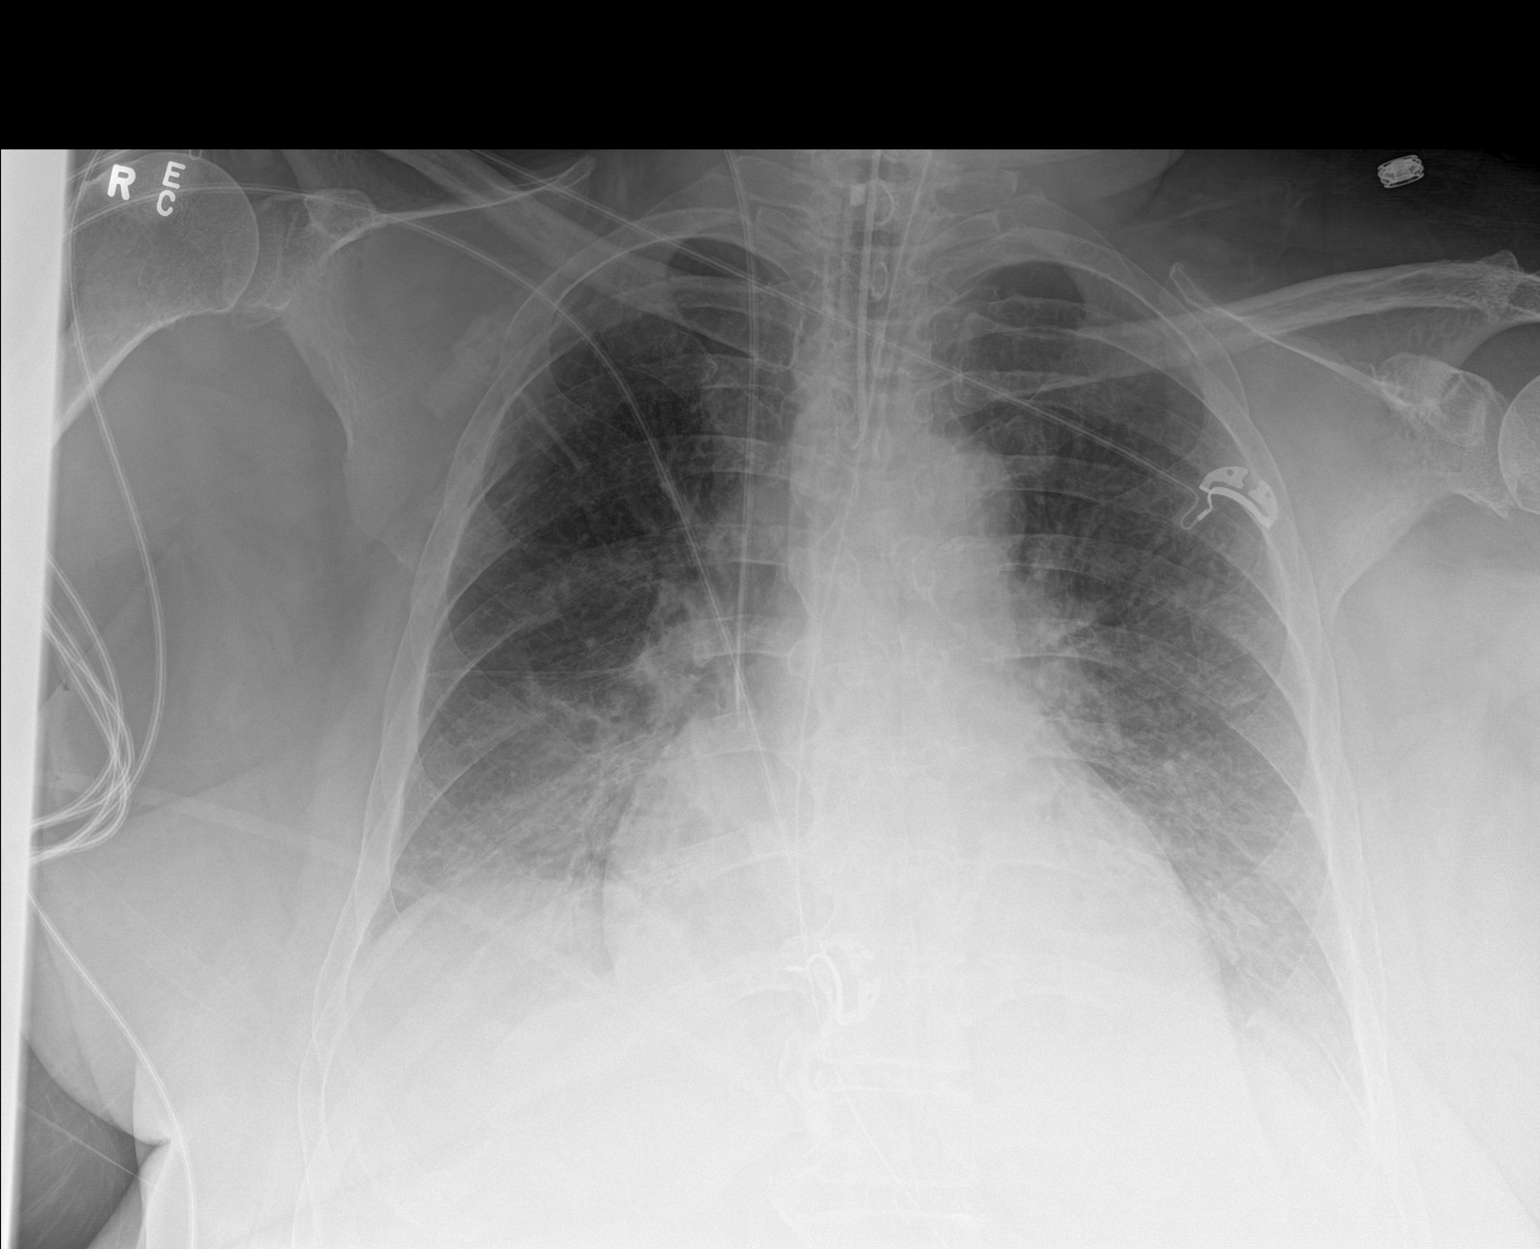

[1 of 1 positions shown; findings below may reference images not displayed]

FINDINGS: A right IJ central venous catheter has been placed with tip
overlying the superior cavoatrial junction. There is no evidence of
pneumothorax.

Cardiomegaly, improved pulmonary edema, bilateral lower lung
atelectasis/ airspace disease and probable effusions noted.

An endotracheal tube with tip 2.3 cm above the carina and NG tube
entering the stomach with tip off the field of view noted.
IMPRESSION: Right IJ central venous catheter with tip overlying the superior
cavoatrial junction. No evidence of pneumothorax.

Cardiomegaly with improved pulmonary edema. Continued bilateral
lower lung atelectasis/ airspace disease and probable effusions.

## 2018-01-12 IMAGING — CT CT HEAD W/O CM
4 of 8 series · 16 of 47 positions shown, 18 images · non-contrast
Comparison: 11/07/2012

CLINICAL DATA: Respiratory failure and status post intubation.

EXAM:
CT HEAD WITHOUT CONTRAST
TECHNIQUE: Contiguous axial images were obtained from the base of the skull
through the vertex without intravenous contrast.

[Series 3: head wo · axial · 0.41mm/px · z∈[-165,-70]mm · 5 of 29 slices shown (1 of 2)]
[im 5/29  brain]
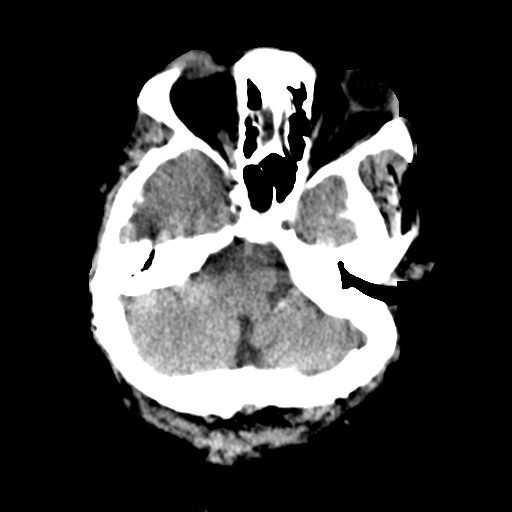
[im 10/29  brain]
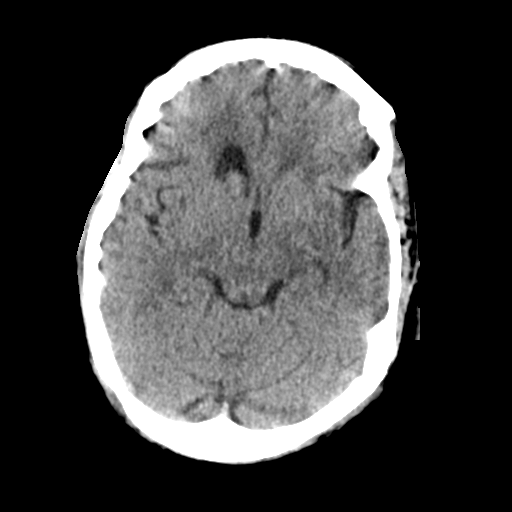
[im 15/29  brain]
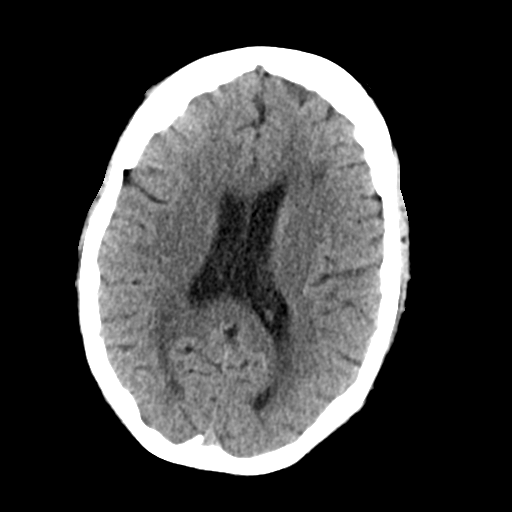
[im 19/29  brain]
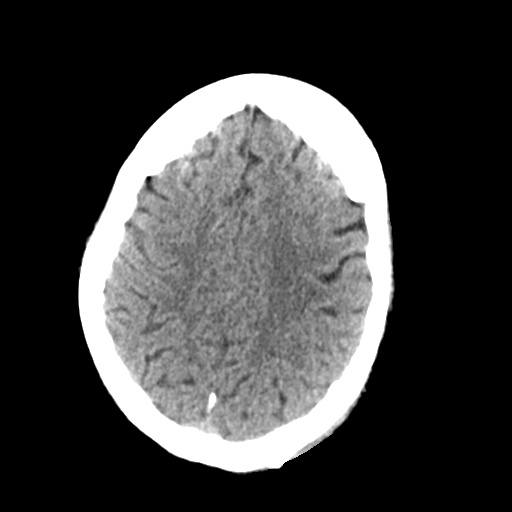
[im 24/29  brain]
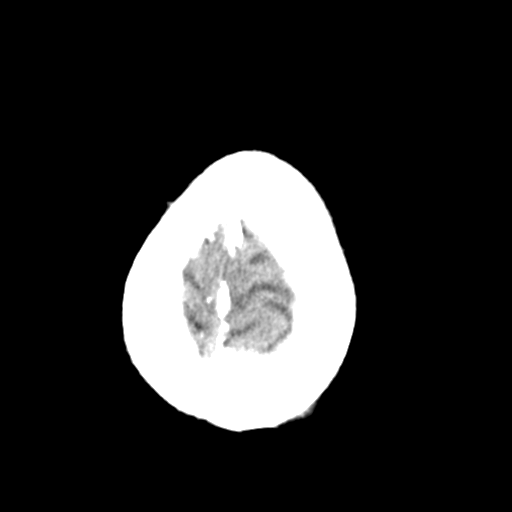

[Series 4: coronal soft tissue · coronal · 0.28mm/px · 3 of 63 slices shown]
[im 16/63  brain]
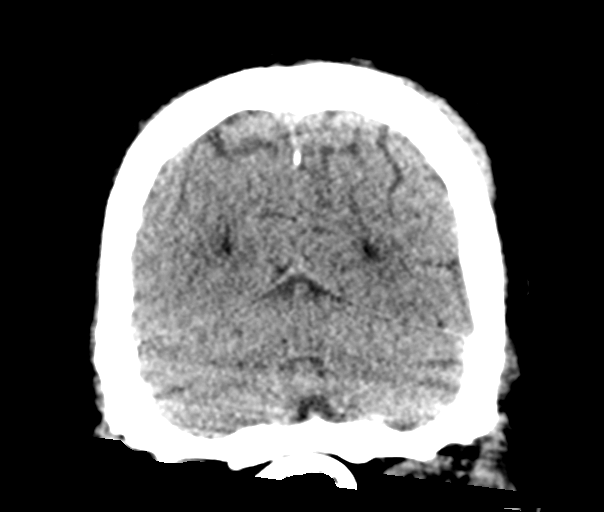
[im 32/63  brain]
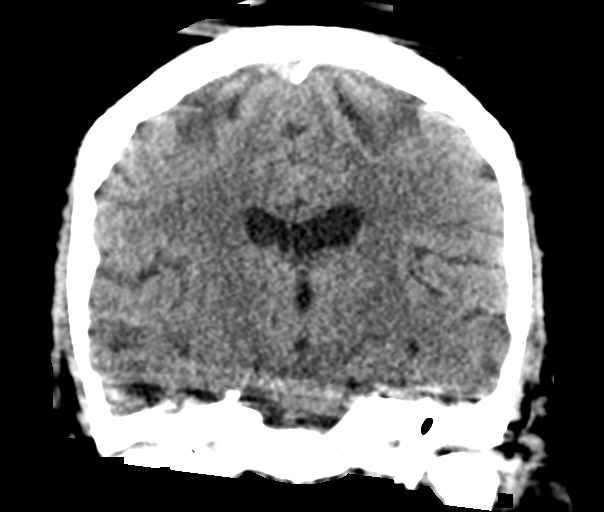
[im 47/63  brain]
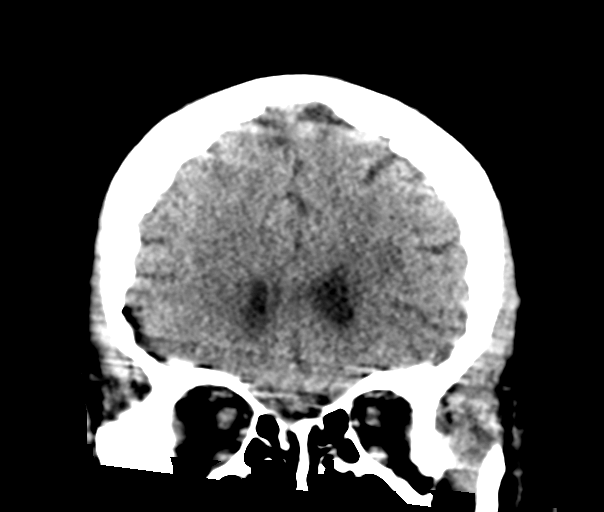

[Series 6: head wo · axial · 0.41mm/px · z∈[-165,-65]mm · 6 of 29 slices shown, 8 images (2 of 2)]
[im 5/29  brain]
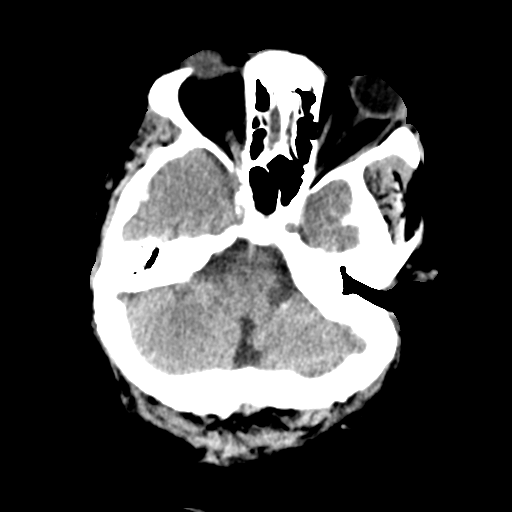
[im 5/29  bone]
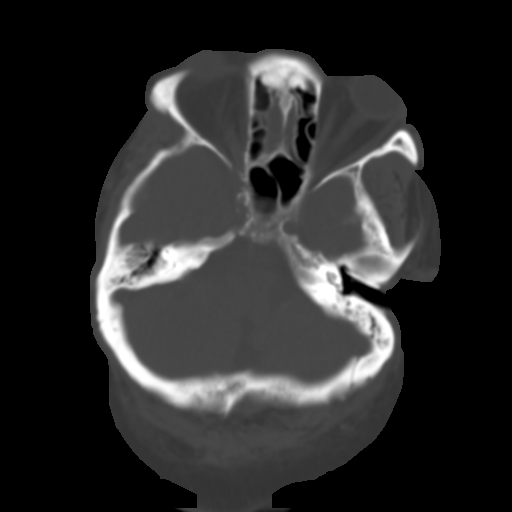
[im 9/29  brain]
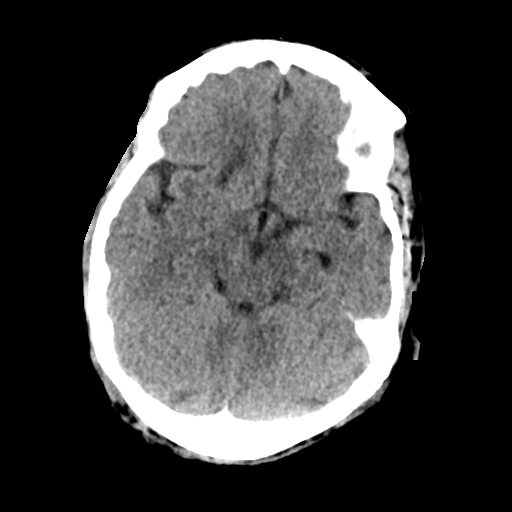
[im 13/29  brain]
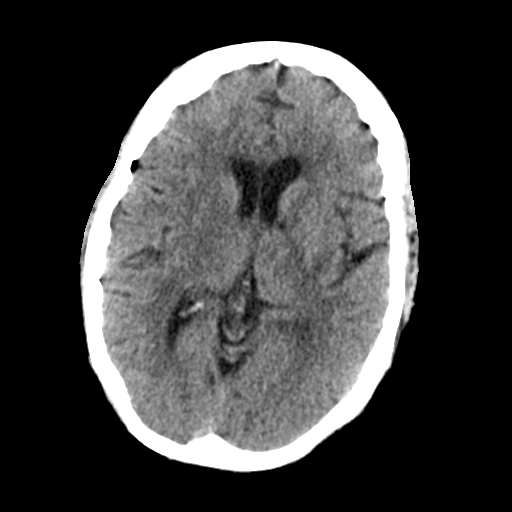
[im 17/29  brain]
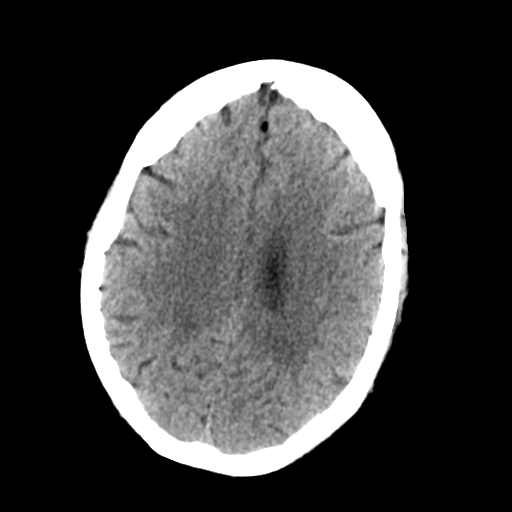
[im 21/29  brain]
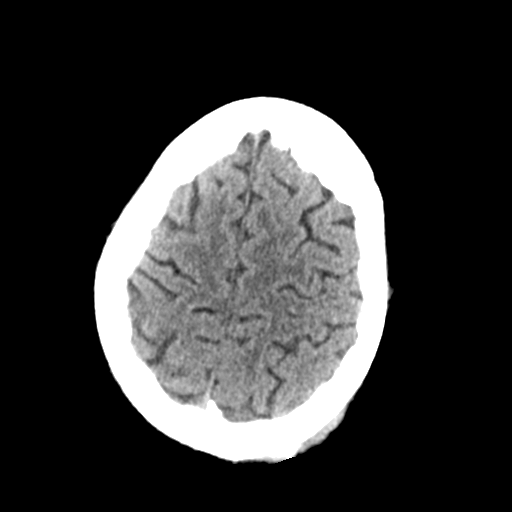
[im 21/29  bone]
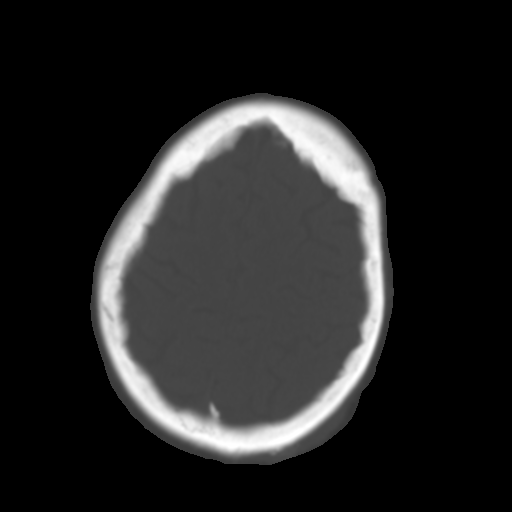
[im 25/29  brain]
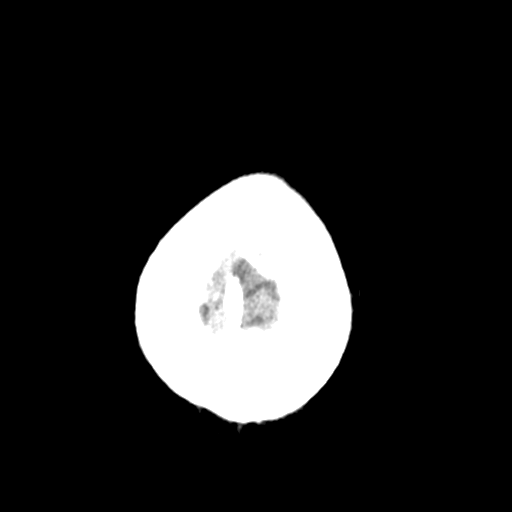

[Series 9: sagittal soft tissue · sagittal · 0.28mm/px · 2 of 50 slices shown]
[im 17/50  brain]
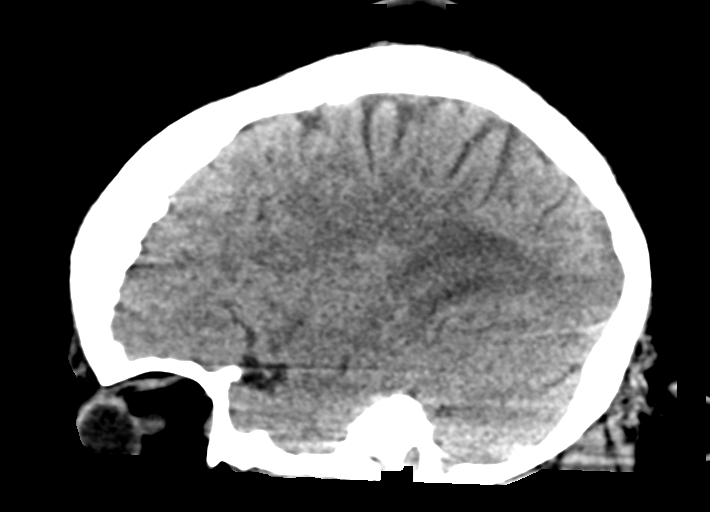
[im 33/50  brain]
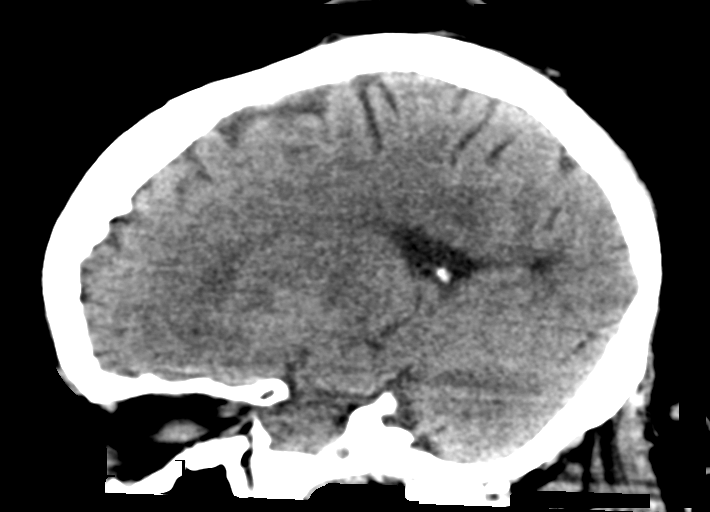

[16 of 47 positions shown; findings below may reference images not displayed]

FINDINGS: Brain: No acute hemorrhage identified. There is an old appearing
lacunar infarct along the posterior aspect of the left caudate
nucleus where it borders the internal capsule. No acute appearing
cerebral infarct identified by CT. No evidence of mass effect, mass
lesion, hydrocephalus or extra-axial fluid collection.

Vascular: No hyperdense vessel or unexpected calcification.

Skull: Normal. Negative for fracture or focal lesion.

Sinuses/Orbits: No acute finding.

Other: None.
IMPRESSION: No acute findings. Old appearing lacunar infarct of the left caudate
nucleus/ internal capsule junction.

## 2018-01-12 IMAGING — DX DG CHEST 1V PORT
1 series · 1 of 1 positions shown · non-contrast
Comparison: 12/05/2015

CLINICAL DATA: Respiratory distress and status post intubation.

EXAM:
PORTABLE CHEST 1 VIEW

[chest ap]
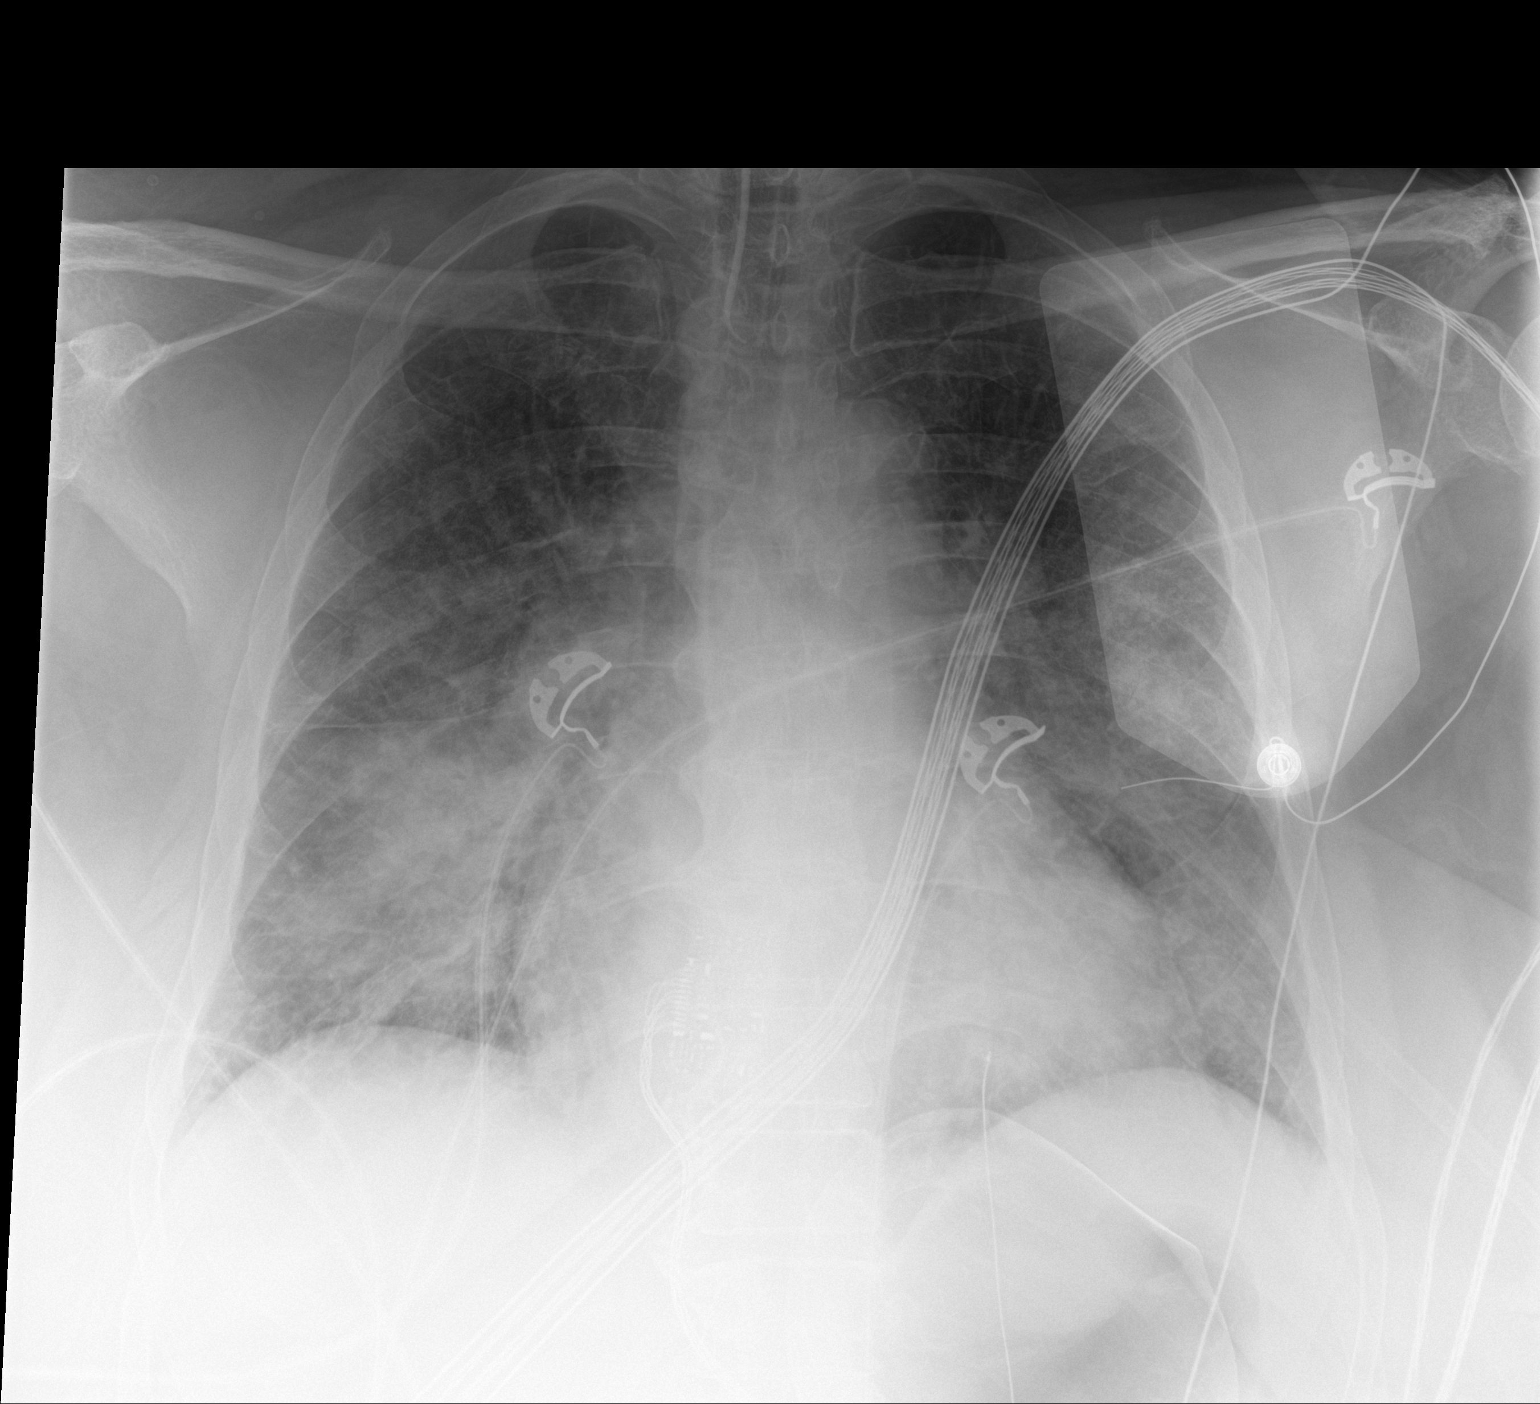

[1 of 1 positions shown; findings below may reference images not displayed]

FINDINGS: Endotracheal tube present with the tip approximately 4 cm above the
carina. There is new consolidative airspace disease in the right
lower lung consistent with pneumonia/ aspiration. Airspace disease
is also suspected in the left perihilar lung. No pneumothorax or
significant pleural effusions identified. The heart size is stable
and within normal limits. Pacing pads present.
IMPRESSION: 1. Endotracheal tube tip approximately 4 cm above the carina.
2. New airspace disease in the right lower lung consistent with
pneumonia/aspiration. Airspace disease is also suspected in the left
perihilar lung.

## 2018-01-12 IMAGING — DX DG ABD PORTABLE 1V
1 series · 1 of 1 positions shown · non-contrast
Comparison: 12/05/2015

CLINICAL DATA: OG tube placement

EXAM:
PORTABLE ABDOMEN - 1 VIEW

[abdomen kub]
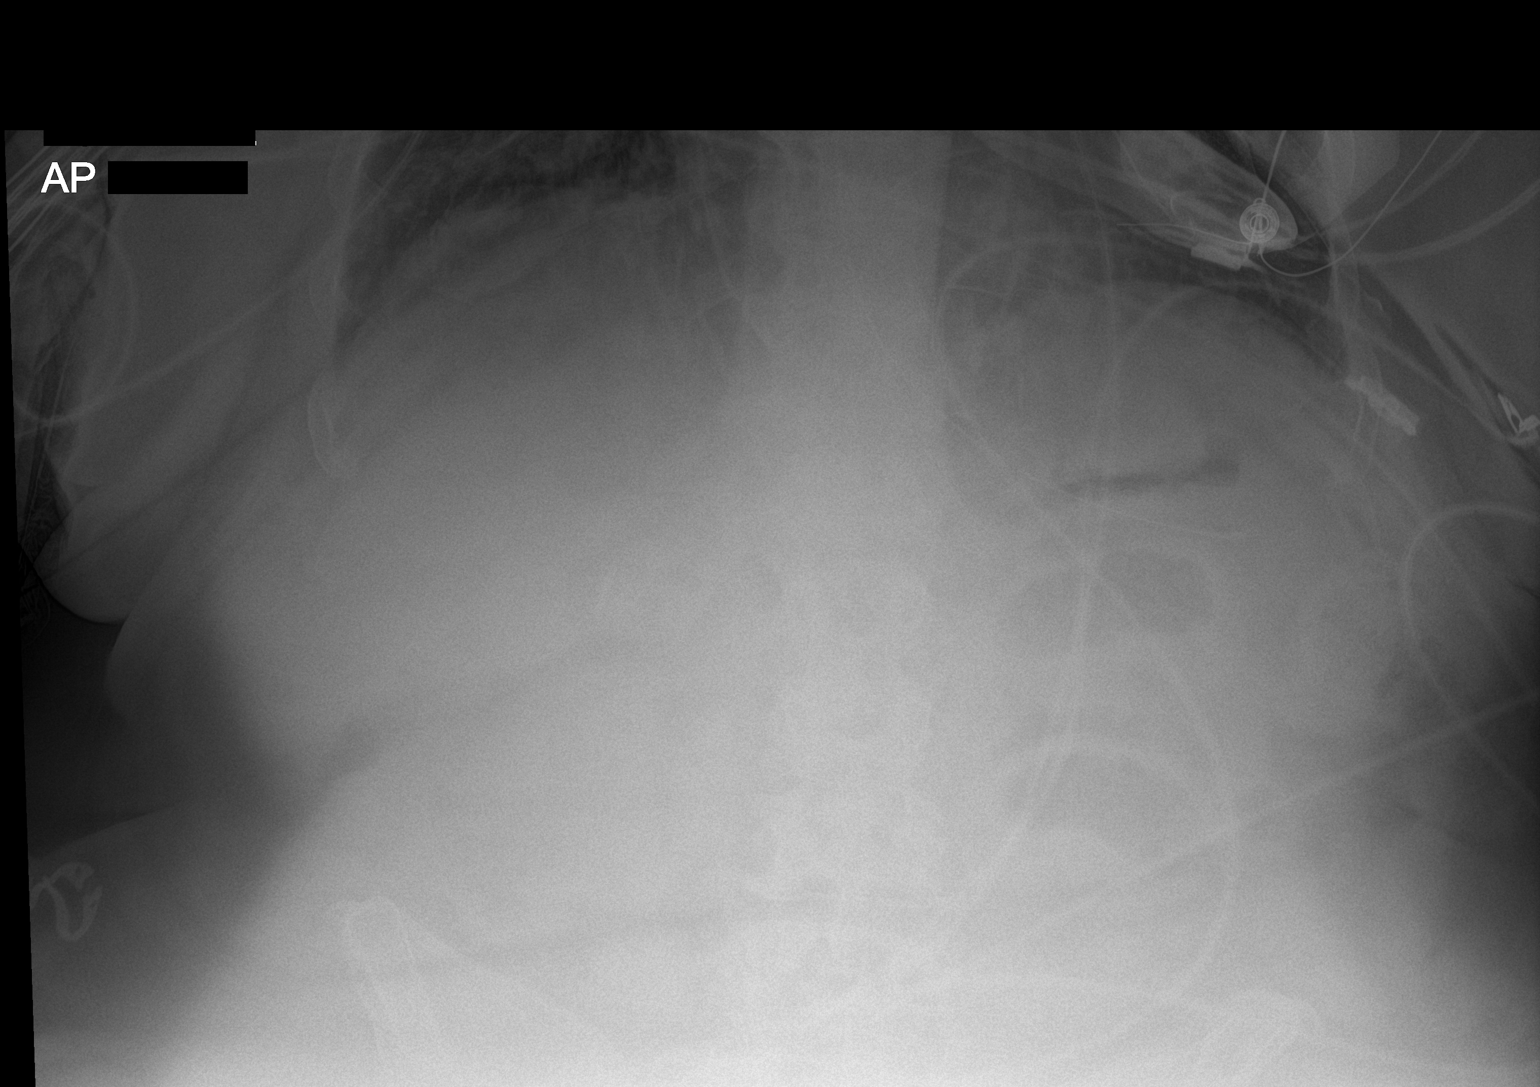

[1 of 1 positions shown; findings below may reference images not displayed]

FINDINGS: Enteric tube tip is in the proximal to mid stomach with the side
port in the proximal stomach.
IMPRESSION: Enteric tube tip in the stomach.

## 2018-01-13 ENCOUNTER — Inpatient Hospital Stay
Admission: EM | Admit: 2018-01-13 | Discharge: 2018-01-16 | DRG: 291 | Disposition: A | Discharge status: Home Health | Payer: Medicaid Other | Attending: Internal Medicine | Admitting: Internal Medicine

## 2018-01-13 ENCOUNTER — Inpatient Hospital Stay: Payer: Medicaid Other

## 2018-01-13 ENCOUNTER — Encounter: Payer: Self-pay | Admitting: Emergency Medicine

## 2018-01-13 ENCOUNTER — Emergency Department: Payer: Medicaid Other

## 2018-01-13 ENCOUNTER — Other Ambulatory Visit: Payer: Self-pay

## 2018-01-13 DIAGNOSIS — E785 Hyperlipidemia, unspecified: Secondary | ICD-10-CM | POA: Diagnosis present

## 2018-01-13 DIAGNOSIS — I252 Old myocardial infarction: Secondary | ICD-10-CM

## 2018-01-13 DIAGNOSIS — Z79899 Other long term (current) drug therapy: Secondary | ICD-10-CM

## 2018-01-13 DIAGNOSIS — I5031 Acute diastolic (congestive) heart failure: Secondary | ICD-10-CM | POA: Diagnosis present

## 2018-01-13 DIAGNOSIS — K219 Gastro-esophageal reflux disease without esophagitis: Secondary | ICD-10-CM | POA: Diagnosis present

## 2018-01-13 DIAGNOSIS — F17210 Nicotine dependence, cigarettes, uncomplicated: Secondary | ICD-10-CM | POA: Diagnosis present

## 2018-01-13 DIAGNOSIS — I340 Nonrheumatic mitral (valve) insufficiency: Secondary | ICD-10-CM | POA: Diagnosis present

## 2018-01-13 DIAGNOSIS — T502X5A Adverse effect of carbonic-anhydrase inhibitors, benzothiadiazides and other diuretics, initial encounter: Secondary | ICD-10-CM | POA: Diagnosis present

## 2018-01-13 DIAGNOSIS — I130 Hypertensive heart and chronic kidney disease with heart failure and stage 1 through stage 4 chronic kidney disease, or unspecified chronic kidney disease: Secondary | ICD-10-CM | POA: Diagnosis present

## 2018-01-13 DIAGNOSIS — Z7951 Long term (current) use of inhaled steroids: Secondary | ICD-10-CM

## 2018-01-13 DIAGNOSIS — J449 Chronic obstructive pulmonary disease, unspecified: Secondary | ICD-10-CM | POA: Diagnosis present

## 2018-01-13 DIAGNOSIS — Z953 Presence of xenogenic heart valve: Secondary | ICD-10-CM | POA: Diagnosis not present

## 2018-01-13 DIAGNOSIS — Z888 Allergy status to other drugs, medicaments and biological substances status: Secondary | ICD-10-CM | POA: Diagnosis not present

## 2018-01-13 DIAGNOSIS — Z951 Presence of aortocoronary bypass graft: Secondary | ICD-10-CM

## 2018-01-13 DIAGNOSIS — Z8541 Personal history of malignant neoplasm of cervix uteri: Secondary | ICD-10-CM

## 2018-01-13 DIAGNOSIS — I2510 Atherosclerotic heart disease of native coronary artery without angina pectoris: Secondary | ICD-10-CM | POA: Diagnosis present

## 2018-01-13 DIAGNOSIS — E1122 Type 2 diabetes mellitus with diabetic chronic kidney disease: Secondary | ICD-10-CM | POA: Diagnosis present

## 2018-01-13 DIAGNOSIS — Z833 Family history of diabetes mellitus: Secondary | ICD-10-CM

## 2018-01-13 DIAGNOSIS — Z794 Long term (current) use of insulin: Secondary | ICD-10-CM

## 2018-01-13 DIAGNOSIS — R0609 Other forms of dyspnea: Secondary | ICD-10-CM

## 2018-01-13 DIAGNOSIS — Z91040 Latex allergy status: Secondary | ICD-10-CM | POA: Diagnosis not present

## 2018-01-13 DIAGNOSIS — R0602 Shortness of breath: Secondary | ICD-10-CM | POA: Diagnosis present

## 2018-01-13 DIAGNOSIS — N183 Chronic kidney disease, stage 3 (moderate): Secondary | ICD-10-CM | POA: Diagnosis present

## 2018-01-13 DIAGNOSIS — D509 Iron deficiency anemia, unspecified: Secondary | ICD-10-CM | POA: Diagnosis present

## 2018-01-13 DIAGNOSIS — Z8249 Family history of ischemic heart disease and other diseases of the circulatory system: Secondary | ICD-10-CM

## 2018-01-13 DIAGNOSIS — I509 Heart failure, unspecified: Secondary | ICD-10-CM

## 2018-01-13 DIAGNOSIS — R52 Pain, unspecified: Secondary | ICD-10-CM

## 2018-01-13 DIAGNOSIS — R609 Edema, unspecified: Secondary | ICD-10-CM

## 2018-01-13 LAB — TROPONIN I
Troponin I: <0.03 ng/mL (ref <0.03)
Troponin I: <0.03 ng/mL (ref <0.03)

## 2018-01-13 LAB — BASIC METABOLIC PANEL
Anion gap: 10 (ref 5–15)
BUN: 17 mg/dL (ref 8–23)
CO2: 21 mmol/L — ABNORMAL LOW (ref 22–32)
CREATININE: 1.25 mg/dL — AB (ref 0.44–1.00)
Calcium: 9 mg/dL (ref 8.9–10.3)
Chloride: 107 mmol/L (ref 98–111)
GFR calc Af Amer: 53 mL/min — ABNORMAL LOW (ref >60)
GFR calc non Af Amer: 46 mL/min — ABNORMAL LOW (ref >60)
Glucose, Bld: 114 mg/dL — ABNORMAL HIGH (ref 70–99)
Potassium: 3.7 mmol/L (ref 3.5–5.1)
Sodium: 138 mmol/L (ref 135–145)

## 2018-01-13 LAB — CBC WITH DIFFERENTIAL/PLATELET
Abs Immature Granulocytes: 0.02 10*3/uL (ref 0.00–0.07)
Basophils Absolute: 0.1 10*3/uL (ref 0.0–0.1)
Basophils Relative: 1 %
EOS PCT: 1 %
Eosinophils Absolute: 0.1 10*3/uL (ref 0.0–0.5)
HEMATOCRIT: 34 % — AB (ref 36.0–46.0)
Hemoglobin: 9.5 g/dL — ABNORMAL LOW (ref 12.0–15.0)
Immature Granulocytes: 0 %
Lymphocytes Relative: 17 %
Lymphs Abs: 1.1 10*3/uL (ref 0.7–4.0)
MCH: 21.9 pg — ABNORMAL LOW (ref 26.0–34.0)
MCHC: 27.9 g/dL — ABNORMAL LOW (ref 30.0–36.0)
MCV: 78.3 fL — ABNORMAL LOW (ref 80.0–100.0)
MONO ABS: 0.4 10*3/uL (ref 0.1–1.0)
Monocytes Relative: 6 %
Neutro Abs: 4.9 10*3/uL (ref 1.7–7.7)
Neutrophils Relative %: 75 %
Platelets: 165 10*3/uL (ref 150–400)
RBC: 4.34 MIL/uL (ref 3.87–5.11)
RDW: 24.2 % — ABNORMAL HIGH (ref 11.5–15.5)
Smear Review: NORMAL
WBC: 6.5 10*3/uL (ref 4.0–10.5)
nRBC: 0 % (ref 0.0–0.2)

## 2018-01-13 LAB — BRAIN NATRIURETIC PEPTIDE: B Natriuretic Peptide: 1068 pg/mL — ABNORMAL HIGH (ref 0.0–100.0)

## 2018-01-13 LAB — GLUCOSE, CAPILLARY: Glucose-Capillary: 161 mg/dL — ABNORMAL HIGH (ref 70–99)

## 2018-01-13 MED ORDER — CARVEDILOL 6.25 MG PO TABS
3.1250 mg | ORAL_TABLET | Freq: Two times a day (BID) | ORAL | Status: DC
  Administered 2018-01-14 – 2018-01-15 (×4): 3.125 mg via ORAL
  Filled 2018-01-13 (×5): qty 1

## 2018-01-13 MED ORDER — INSULIN ASPART 100 UNIT/ML ~~LOC~~ SOLN
0.0000 [IU] | Freq: Three times a day (TID) | SUBCUTANEOUS | Status: DC
  Administered 2018-01-14: 2 [IU] via SUBCUTANEOUS
  Administered 2018-01-15 – 2018-01-16 (×3): 1 [IU] via SUBCUTANEOUS
  Filled 2018-01-13 (×4): qty 1

## 2018-01-13 MED ORDER — PREGABALIN 50 MG PO CAPS: 100.0000 mg | ORAL_CAPSULE | ORAL | Status: DC

## 2018-01-13 MED ORDER — FUROSEMIDE 10 MG/ML IJ SOLN
6.0000 mg/h | INTRAVENOUS | Status: DC
  Administered 2018-01-13 – 2018-01-15 (×2): 6 mg/h via INTRAVENOUS
  Filled 2018-01-13 (×3): qty 25

## 2018-01-13 MED ORDER — INSULIN GLARGINE 100 UNIT/ML ~~LOC~~ SOLN
16.0000 [IU] | Freq: Every day | SUBCUTANEOUS | Status: DC
  Administered 2018-01-14 – 2018-01-16 (×3): 16 [IU] via SUBCUTANEOUS
  Filled 2018-01-13 (×4): qty 0.16

## 2018-01-13 MED ORDER — PREGABALIN 50 MG PO CAPS
100.0000 mg | ORAL_CAPSULE | Freq: Every day | ORAL | Status: DC
  Administered 2018-01-14 – 2018-01-16 (×3): 100 mg via ORAL
  Filled 2018-01-13 (×3): qty 2

## 2018-01-13 MED ORDER — ALBUTEROL SULFATE HFA 108 (90 BASE) MCG/ACT IN AERS: 1.0000 | INHALATION_SPRAY | RESPIRATORY_TRACT | Status: DC | PRN

## 2018-01-13 MED ORDER — FERROUS SULFATE 325 (65 FE) MG PO TABS
325.0000 mg | ORAL_TABLET | Freq: Two times a day (BID) | ORAL | Status: DC
  Administered 2018-01-14 – 2018-01-16 (×5): 325 mg via ORAL
  Filled 2018-01-13 (×5): qty 1

## 2018-01-13 MED ORDER — IRBESARTAN 75 MG PO TABS
75.0000 mg | ORAL_TABLET | Freq: Every day | ORAL | Status: DC
  Administered 2018-01-14 – 2018-01-15 (×2): 75 mg via ORAL
  Filled 2018-01-13 (×4): qty 1

## 2018-01-13 MED ORDER — ENOXAPARIN SODIUM 40 MG/0.4ML ~~LOC~~ SOLN
40.0000 mg | SUBCUTANEOUS | Status: DC
  Administered 2018-01-13 – 2018-01-15 (×3): 40 mg via SUBCUTANEOUS
  Filled 2018-01-13 (×3): qty 0.4

## 2018-01-13 MED ORDER — INSULIN ASPART 100 UNIT/ML ~~LOC~~ SOLN: 0.0000 [IU] | Freq: Every day | SUBCUTANEOUS | Status: DC

## 2018-01-13 MED ORDER — ONDANSETRON HCL 4 MG PO TABS: 4.0000 mg | ORAL_TABLET | Freq: Four times a day (QID) | ORAL | Status: DC | PRN

## 2018-01-13 MED ORDER — ACETAMINOPHEN 650 MG RE SUPP: 650.0000 mg | Freq: Four times a day (QID) | RECTAL | Status: DC | PRN

## 2018-01-13 MED ORDER — ONDANSETRON HCL 4 MG/2ML IJ SOLN: 4.0000 mg | Freq: Four times a day (QID) | INTRAMUSCULAR | Status: DC | PRN

## 2018-01-13 MED ORDER — ATORVASTATIN CALCIUM 20 MG PO TABS
40.0000 mg | ORAL_TABLET | Freq: Every day | ORAL | Status: DC
  Administered 2018-01-14 – 2018-01-15 (×2): 40 mg via ORAL
  Filled 2018-01-13 (×2): qty 2

## 2018-01-13 MED ORDER — PANTOPRAZOLE SODIUM 40 MG PO TBEC
40.0000 mg | DELAYED_RELEASE_TABLET | Freq: Every day | ORAL | Status: DC
  Administered 2018-01-14 – 2018-01-16 (×3): 40 mg via ORAL
  Filled 2018-01-13 (×3): qty 1

## 2018-01-13 MED ORDER — ALBUTEROL SULFATE (2.5 MG/3ML) 0.083% IN NEBU: 2.5000 mg | INHALATION_SOLUTION | RESPIRATORY_TRACT | Status: DC | PRN

## 2018-01-13 MED ORDER — ACETAMINOPHEN 325 MG PO TABS
650.0000 mg | ORAL_TABLET | Freq: Four times a day (QID) | ORAL | Status: DC | PRN
  Administered 2018-01-16: 650 mg via ORAL
  Filled 2018-01-13: qty 2

## 2018-01-13 MED ORDER — INSULIN GLARGINE 100 UNIT/ML SOLOSTAR PEN: 16.0000 [IU] | PEN_INJECTOR | Freq: Every day | SUBCUTANEOUS | Status: DC

## 2018-01-13 MED ORDER — FOLIC ACID 1 MG PO TABS
1.0000 mg | ORAL_TABLET | Freq: Every day | ORAL | Status: DC
  Administered 2018-01-14 – 2018-01-16 (×3): 1 mg via ORAL
  Filled 2018-01-13 (×3): qty 1

## 2018-01-13 MED ORDER — MOMETASONE FURO-FORMOTEROL FUM 200-5 MCG/ACT IN AERO
2.0000 | INHALATION_SPRAY | Freq: Two times a day (BID) | RESPIRATORY_TRACT | Status: DC
  Administered 2018-01-14 – 2018-01-16 (×5): 2 via RESPIRATORY_TRACT
  Filled 2018-01-13: qty 8.8

## 2018-01-13 MED ORDER — POTASSIUM CHLORIDE CRYS ER 20 MEQ PO TBCR
20.0000 meq | EXTENDED_RELEASE_TABLET | Freq: Two times a day (BID) | ORAL | Status: DC
  Administered 2018-01-13 – 2018-01-15 (×5): 20 meq via ORAL
  Filled 2018-01-13 (×5): qty 1

## 2018-01-13 MED ORDER — FUROSEMIDE 10 MG/ML IJ SOLN
40.0000 mg | Freq: Once | INTRAMUSCULAR | Status: AC
  Administered 2018-01-13: 40 mg via INTRAVENOUS
  Filled 2018-01-13: qty 4

## 2018-01-13 MED ORDER — PREGABALIN 75 MG PO CAPS
200.0000 mg | ORAL_CAPSULE | Freq: Every day | ORAL | Status: DC
  Administered 2018-01-13 – 2018-01-15 (×3): 200 mg via ORAL
  Filled 2018-01-13: qty 1
  Filled 2018-01-13: qty 6
  Filled 2018-01-13: qty 1

## 2018-01-13 NOTE — ED Notes (Signed)
Admitting provider at bedside.

## 2018-01-13 NOTE — Progress Notes (Addendum)
Patient ID: [name], female   DOB: August 21, 1955, 62 y.o.   MRN: [id number]  ACP note  Patient present  Diagnosis: Acute on chronic diastolic congestive heart failure with mitral regurgitation.,  Accelerated hypertension, type 2 diabetes mellitus, COPD, iron deficiency anemia, weakness.  CODE STATUS discussed and patient wishes to be a full code.  Plan.  Start IV Lasix drip to remove fluid.  Repeat echocardiogram.  Cardiology consultation.  Better blood pressure control.  Time spent on ACP discussion 17 minutes Dr. Loletha Grayer

## 2018-01-13 NOTE — ED Provider Notes (Signed)
[facility] Emergency Department Provider Note  ____________________________________________  Time seen: Approximately 2:59 PM  I have reviewed the triage vital signs and the nursing notes.   HISTORY  Chief Complaint Shortness of Breath    HPI [name] is a 62 y.o. female with a history of COPD CHF diabetes who comes the ED complaining of worsening shortness of breath and dyspnea on exertion.  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  Her peripheral edema is much worse as well despite continued use of diuretics at home.  She sits upright.  She has orthopnea.  Denies chest pain.     Past Medical History:  Diagnosis Date  . Cervical cancer ([place])   . CHF (congestive heart failure) ([place])   . COPD (chronic obstructive pulmonary disease) ([place])   . Diabetes mellitus without complication ([place])   . NSTEMI (non-ST elevated myocardial infarction) [facility])      Patient Active Problem List   Diagnosis Date Noted  . B12 deficiency 12/07/2017  . Iron deficiency anemia due to chronic blood loss 12/07/2017  . Folate deficiency 12/07/2017  . GIB (gastrointestinal bleeding) 11/03/2017  . COPD (chronic obstructive pulmonary disease) ([place]) 09/26/2017  . Symptomatic anemia 09/26/2017  . Acute on chronic systolic CHF (congestive heart failure) ([place]) 08/27/2017  . Anemia   . Weakness   . GI bleed 08/07/2017  . Chronic systolic heart failure ([place]) 12/28/2016  . Diabetes ([place]) 12/28/2016  . Tobacco use 12/28/2016  . NSTEMI (non-ST elevated myocardial infarction) ([place])   . Sepsis ([place]) 12/18/2016  . CHF exacerbation ([place]) 12/04/2015  . PNA (pneumonia) 10/28/2015  . COPD exacerbation ([place]) 08/29/2015     Past Surgical History:  Procedure Laterality Date  . APPENDECTOMY    . CARDIAC CATHETERIZATION    . CHOLECYSTECTOMY    . COLONOSCOPY N/A 09/28/2017   Procedure: COLONOSCOPY;   Surgeon: Toledo, Benay Pike, MD;  Location: [facility] ENDOSCOPY;  Service: Gastroenterology;  Laterality: N/A;  . ENTEROSCOPY Left 11/06/2017   Procedure: ENTEROSCOPY;  Surgeon: Jonathon Bellows, MD;  Location[facility] ENDOSCOPY;  Service: Gastroenterology;  Laterality: Left;  . ESOPHAGOGASTRODUODENOSCOPY N/A 12/19/2016   Procedure: ESOPHAGOGASTRODUODENOSCOPY (EGD);  Surgeon: Lin Landsman, MD;  Location: [facility] ENDOSCOPY;  Service: Gastroenterology;  Laterality: N/A;  . ESOPHAGOGASTRODUODENOSCOPY N/A 09/28/2017   Procedure: ESOPHAGOGASTRODUODENOSCOPY (EGD);  Surgeon: Toledo, Benay Pike, MD;  Location: [facility] ENDOSCOPY;  Service: Gastroenterology;  Laterality: N/A;  . ESOPHAGOGASTRODUODENOSCOPY (EGD) WITH PROPOFOL N/A 08/08/2017   Procedure: ESOPHAGOGASTRODUODENOSCOPY (EGD) WITH PROPOFOL;  Surgeon: [name], MD;  Location: [facility] ENDOSCOPY;  Service: Endoscopy;  Laterality: N/A;  . LEFT HEART CATH AND CORONARY ANGIOGRAPHY N/A 12/23/2016   Procedure: LEFT HEART CATH AND CORONARY ANGIOGRAPHY;  Surgeon: Teodoro Spray, MD;  Location: [place] CV LAB;  Service: Cardiovascular;  Laterality: N/A;     Prior to Admission medications   Medication Sig Start Date End Date Taking? Authorizing Provider  atorvastatin (LIPITOR) 40 MG tablet Take 1 tablet (40 mg total) by mouth daily. 08/29/17 12/27/17  Dustin Flock, MD  blood glucose meter kit and supplies KIT Dispense based on patient and insurance preference. Use up to four times daily as directed. (FOR ICD-9 250.00, 250.01). 12/24/16   Hillary Bow, MD  carvedilol (COREG) 3.125 MG tablet Take 1 tablet (3.125 mg total) by mouth 2 (two) times daily with a meal. 08/29/17   Dustin Flock, MD  ferrous  sulfate 325 (65 FE) MG tablet Take 1 tablet (325 mg total) by mouth 2 (two) times daily with a meal. 09/30/17   Epifanio Lesches, MD  folic acid (FOLVITE) 1 MG tablet Take 1 tablet (1 mg total) by mouth daily. 09/30/17   Epifanio Lesches, MD  furosemide (LASIX) 40  MG tablet Take 1 tablet (40 mg total) by mouth 2 (two) times daily. 08/29/17 08/29/18  Dustin Flock, MD  Insulin Glargine (LANTUS SOLOSTAR) 100 UNIT/ML Solostar Pen Inject 15 Units into the skin daily. Patient taking differently: Inject 16 Units into the skin daily.  08/29/17   Dustin Flock, MD  Insulin Pen Needle 32G X 5 MM MISC 1 Units by Does not apply route daily. 12/24/16   Hillary Bow, MD  omeprazole (PRILOSEC) 40 MG capsule Take 1 capsule (40 mg total) by mouth daily. Patient not taking: Reported on 12/07/2017 11/06/17 11/06/18  Dustin Flock, MD  ondansetron (ZOFRAN) 4 MG tablet Take 1 tablet (4 mg total) by mouth every 6 (six) hours as needed for nausea. Patient not taking: Reported on 11/23/2017 08/29/17   Dustin Flock, MD  pregabalin (LYRICA) 100 MG capsule Take 100-200 mg by mouth See admin instructions. Take 1 capsule (100MG) by mouth every morning and 2 capsules (200MG) by mouth every night    [provider]  PROAIR HFA 108 (90 Base) MCG/ACT inhaler Inhale 1-2 puffs into the lungs every 4 (four) hours as needed. 10/10/17   [provider]  SYMBICORT 160-4.5 MCG/ACT inhaler Inhale 2 puffs into the lungs 2 (two) times daily. 10/10/17   [provider]     Allergies Latex and Gabapentin   Family History  Problem Relation Age of Onset  . Hypertension Mother   . Diabetes Mellitus II Mother   . Breast cancer Mother   . Heart disease Father   . Cancer Sister     Social History Social History   Tobacco Use  . Smoking status: Current Some Day Smoker    Packs/day: 0.25    Types: Cigarettes  . Smokeless tobacco: Never Used  Substance Use Topics  . Alcohol use: No  . Drug use: No    Review of Systems  Constitutional:   No fever or chills.  ENT:   No sore throat. No rhinorrhea. Cardiovascular:   No chest pain or syncope. Respiratory:   Positive shortness of breath without cough. Gastrointestinal:   Negative for abdominal pain, vomiting  and diarrhea.  Musculoskeletal:   Positive peripheral edema All other systems reviewed and are negative except as documented above in ROS and HPI.  ____________________________________________   PHYSICAL EXAM:  VITAL SIGNS: ED Triage Vitals  Enc Vitals Group     BP 01/13/18 1121 (!) 176/100     Pulse Rate 01/13/18 1121 88     Resp 01/13/18 1121 (!) 24     Temp 01/13/18 1121 97.6 F (36.4 C)     Temp Source 01/13/18 1121 Oral     SpO2 01/13/18 1120 100 %     Weight 01/13/18 1123 185 lb 3 oz (84 kg)     Height 01/13/18 1123 _0  (1.651 m)     Head Circumference --      Peak Flow --      Pain Score 01/13/18 1123 7     Pain Loc --      Pain Edu? --      Excl. in [place]? --     Vital signs reviewed, nursing assessments reviewed.  Constitutional:   Alert and oriented. Non-toxic appearance. Eyes:   Conjunctivae are normal. EOMI. PERRL. ENT      Head:   Normocephalic and atraumatic.      Nose:   No congestion/rhinnorhea.       Mouth/Throat:   MMM, no pharyngeal erythema. No peritonsillar mass.       Neck:   No meningismus. Full ROM. Hematological/Lymphatic/Immunilogical:   No cervical lymphadenopathy. Cardiovascular:   RRR. Symmetric bilateral radial and DP pulses.  No murmurs. Cap refill less than 2 seconds. Respiratory:   Tachypnea.  Symmetric air entry on both sides.  Bilateral basilar crackles..  No wheezing Gastrointestinal:   Soft and nontender. Non distended. There is no CVA tenderness.  No rebound, rigidity, or guarding.  Musculoskeletal:   Normal range of motion in all extremities. No joint effusions.  No lower extremity tenderness.  2+ pitting edema bilateral lower extremities Neurologic:   Normal speech and language.  Motor grossly intact. No acute focal neurologic deficits are appreciated.  Skin:    Skin is warm, dry and intact. No rash noted.  No petechiae, purpura, or bullae.  ____________________________________________    LABS (pertinent  positives/negatives) (all labs ordered are listed, but only abnormal results are displayed) Labs Reviewed  BASIC METABOLIC PANEL - Abnormal; Notable for the following components:      Result Value   CO2 21 (*)    Glucose, Bld 114 (*)    Creatinine, Ser 1.25 (*)    GFR calc non Af Amer 46 (*)    GFR calc Af Amer 53 (*)    All other components within normal limits  CBC WITH DIFFERENTIAL/PLATELET - Abnormal; Notable for the following components:   Hemoglobin 9.5 (*)    HCT 34.0 (*)    MCV 78.3 (*)    MCH 21.9 (*)    MCHC 27.9 (*)    RDW 24.2 (*)    All other components within normal limits  TROPONIN I   ____________________________________________   EKG  Interpreted by me Sinus rhythm rate of 87, left axis, right bundle branch block.  No acute ischemic changes.  ____________________________________________    RADIOLOGY  Dg Chest 2 View  Result Date: 01/13/2018 CLINICAL DATA:  Patient reports acute on chronic bilateral LE swelling and SOB worsening over the past 3 days. Denies CP, cough or fever. Hx NSTEMI, DM, COPD, CHF, cervical ca, cholecystectomy, open heart surgery approx. 1 year ago. Current smoker. EXAM: CHEST - 2 VIEW COMPARISON:  09/26/2017 FINDINGS: Patchy airspace opacities in the right middle lobe and both lower lobes, new since previous. Bilateral perihilar interstitial prominence, new since previous. Heart size upper limits normal. Previous CABG, AVR, median sternotomy. New small bilateral pleural effusions. Anterior vertebral endplate spurring at multiple levels in the mid and lower thoracic spine. IMPRESSION: New bilateral edema/infiltrates and small effusions. Electronically Signed   By: Lucrezia Europe M.D.   On: 01/13/2018 12:50    ____________________________________________   PROCEDURES Procedures  ____________________________________________  DIFFERENTIAL DIAGNOSIS   CHF exacerbation, pneumonia, non-STEMI.  Doubt unstable angina, pneumothorax, sepsis,  dissection, pericarditis or myocarditis  CLINICAL IMPRESSION / ASSESSMENT AND PLAN / ED COURSE  Pertinent labs & imaging results that were available during my care of the patient were reviewed by me and considered in my medical decision making (see chart for details).      Clinical Course as of Jan 13 1458  Sat Jan 13, 2018  1122 Presents with gradually worsening shortness of breath, acute exacerbation of  her chronic syndrome she is had for the past year since open heart surgery.  Exam reveals some basilar crackles.  Pronounced bilateral lower extremity edema.  Doubt influenza or pneumonia.  I suspect this is a CHF exacerbation.  Check labs and chest x-ray.   [PS]  3354 Labs unremarkable.  White blood cell count normal.  Chest x-ray consistent with pulmonary edema.  Oxygenation is adequate, but with her severe symptoms, I will offer hospitalization.   [PS]    Clinical Course User Index [PS] Carrie Mew, MD    ----------------------------------------- 3:03 PM on 01/13/2018 -----------------------------------------  Patient agreeable to hospitalization.  She has been trying escalating diuretics at home and appears to have failed outpatient therapy.  She is more comfortable sitting upright.  Even though she was not hypoxic on room air, she does feel much more comfortable on supplemental oxygen.  I will discuss with the hospitalist  ____________________________________________   FINAL CLINICAL IMPRESSION(S) / ED DIAGNOSES    Final diagnoses:  Acute on chronic congestive heart failure, unspecified heart failure type ([place])  Dyspnea on exertion  Peripheral edema     ED Discharge Orders    None      Portions of this note were generated with dragon dictation software. Dictation errors may occur despite best attempts at proofreading.   Carrie Mew, MD 01/13/18 [phone]

## 2018-01-13 NOTE — ED Notes (Signed)
ED TO INPATIENT HANDOFF REPORT  Name/Age/Gender [name] 62 y.o. female  Code Status    Code Status Orders  (From admission, onward)         Start     Ordered   01/13/18 1538  Full code  Continuous     01/13/18 1539        Code Status History    Date Active Date Inactive Code Status Order ID Comments User Context   11/03/2017 1758 11/06/2017 2136 Full Code [id number]  [name], MD Inpatient   09/26/2017 2353 09/30/2017 1404 Full Code [id number]  Lance Coon, MD Inpatient   08/27/2017 0347 08/29/2017 2054 Full Code [id number]  Harrie Foreman, MD Inpatient   08/07/2017 1704 08/09/2017 2237 Full Code [id number]  Saundra Shelling, MD Inpatient   12/18/2016 1146 12/26/2016 2032 Full Code [id number]  Vaughan Basta, MD Inpatient   12/18/2016 0959 12/18/2016 1146 Full Code [id number]  Vaughan Basta, MD ED   12/04/2015 0509 12/07/2015 1756 Full Code [id number]  Holley Raring, NP ED   10/28/2015 1640 10/29/2015 1711 Full Code [id number]  Bettey Costa, MD Inpatient   08/29/2015 1438 08/30/2015 1750 Full Code [id number]  Hugelmeyer, Ubaldo Glassing, DO Inpatient      Home/SNF/Other Home  Chief Complaint Shortness of Breath  Level of Care/Admitting Diagnosis ED Disposition    ED Disposition Condition [place]: [place] [[id number]]  Level of Care: Telemetry [5]  Diagnosis: CHF (congestive heart failure) [facility]) [[id number]]  Admitting Physician: Loletha Grayer [[id number]]  Attending Physician: Loletha Grayer [phone]  Estimated length of stay: past midnight tomorrow  Certification:: I certify this patient will need inpatient services for at least 2 midnights  PT Class (Do Not Modify): Inpatient [101]  PT Acc Code (Do Not Modify): Private [1]       Medical History Past Medical History:  Diagnosis Date  . Cervical cancer ([place])   . CHF (congestive heart failure) ([place])   . COPD (chronic obstructive pulmonary disease)  ([place])   . Diabetes mellitus without complication ([place])   . NSTEMI (non-ST elevated myocardial infarction) ([facility])     Allergies Allergies  Allergen Reactions  . Latex Anaphylaxis and Rash  . Gabapentin Nausea And Vomiting and Other (See Comments)    IV Location/Drains/Wounds Patient Lines/Drains/Airways Status   Active Line/Drains/Airways    Name:   Placement date:   Placement time:   Site:   Days:   Peripheral IV 01/13/18 Left Antecubital   01/13/18    1147    Antecubital   less than 1          Labs/Imaging Results for orders placed or performed during the hospital encounter of 01/13/18 (from the past 48 hour(s))  Basic metabolic panel     Status: Abnormal   Collection Time: 01/13/18 11:48 AM  Result Value Ref Range   Sodium 138 135 - 145 mmol/L   Potassium 3.7 3.5 - 5.1 mmol/L   Chloride 107 98 - 111 mmol/L   CO2 21 (L) 22 - 32 mmol/L   Glucose, Bld 114 (H) 70 - 99 mg/dL   BUN 17 8 - 23 mg/dL   Creatinine, Ser 1.25 (H) 0.44 - 1.00 mg/dL   Calcium 9.0 8.9 - 10.3 mg/dL   GFR calc non Af Amer 46 (L) >60 mL/min   GFR calc Af Amer 53 (L) >60 mL/min   Anion gap 10 5 - 15    Comment: Performed at [facility],  [place], [place] [address]  Troponin I - Once     Status: None   Collection Time: 01/13/18 11:48 AM  Result Value Ref Range   Troponin I <0.03 <0.03 ng/mL    Comment: Performed at [facility], [address] [place], [place] [address]  CBC with Differential     Status: Abnormal   Collection Time: 01/13/18 11:48 AM  Result Value Ref Range   WBC 6.5 4.0 - 10.5 K/uL   RBC 4.34 3.87 - 5.11 MIL/uL   Hemoglobin 9.5 (L) 12.0 - 15.0 g/dL   HCT 34.0 (L) 36.0 - 46.0 %   MCV 78.3 (L) 80.0 - 100.0 fL   MCH 21.9 (L) 26.0 - 34.0 pg   MCHC 27.9 (L) 30.0 - 36.0 g/dL   RDW 24.2 (H) 11.5 - 15.5 %   Platelets 165 150 - 400 K/uL   nRBC 0.0 0.0 - 0.2 %   Neutrophils Relative % 75 %   Neutro Abs 4.9 1.7 - 7.7 K/uL   Lymphocytes Relative 17 %    Lymphs Abs 1.1 0.7 - 4.0 K/uL   Monocytes Relative 6 %   Monocytes Absolute 0.4 0.1 - 1.0 K/uL   Eosinophils Relative 1 %   Eosinophils Absolute 0.1 0.0 - 0.5 K/uL   Basophils Relative 1 %   Basophils Absolute 0.1 0.0 - 0.1 K/uL   WBC Morphology MORPHOLOGY UNREMARKABLE    Smear Review Normal platelet morphology    Immature Granulocytes 0 %   Abs Immature Granulocytes 0.02 0.00 - 0.07 K/uL   Polychromasia PRESENT     Comment: Performed at [facility], [address], [place], [place] [address]  Brain natriuretic peptide     Status: Abnormal   Collection Time: 01/13/18 11:48 AM  Result Value Ref Range   B Natriuretic Peptide 1,068.0 (H) 0.0 - 100.0 pg/mL    Comment: Performed at [facility], [address] [place], [place] [address]   Dg Chest 2 View  Result Date: 01/13/2018 CLINICAL DATA:  Patient reports acute on chronic bilateral LE swelling and SOB worsening over the past 3 days. Denies CP, cough or fever. Hx NSTEMI, DM, COPD, CHF, cervical ca, cholecystectomy, open heart surgery approx. 1 year ago. Current smoker. EXAM: CHEST - 2 VIEW COMPARISON:  09/26/2017 FINDINGS: Patchy airspace opacities in the right middle lobe and both lower lobes, new since previous. Bilateral perihilar interstitial prominence, new since previous. Heart size upper limits normal. Previous CABG, AVR, median sternotomy. New small bilateral pleural effusions. Anterior vertebral endplate spurring at multiple levels in the mid and lower thoracic spine. IMPRESSION: New bilateral edema/infiltrates and small effusions. Electronically Signed   By: Lucrezia Europe M.D.   On: 01/13/2018 12:50    Pending Labs Unresulted Labs (From admission, onward)    Start     Ordered   01/20/18 0500  Creatinine, serum  (enoxaparin (LOVENOX)    CrCl >/= 30 ml/min)  Weekly,   STAT    Comments:  while on enoxaparin therapy    01/13/18 1539   01/14/18 0929  Basic metabolic panel  Tomorrow morning,   STAT      01/13/18 1539   01/14/18 0500  CBC  Tomorrow morning,   STAT     01/13/18 1539   01/14/18 0500  Magnesium  Tomorrow morning,   STAT     01/13/18 1603   01/13/18 2000  Troponin I - Once  Once,   STAT  01/13/18 1558   01/13/18 1559  Troponin I - Once  Once,   STAT     01/13/18 1558   01/13/18 1538  HIV antibody (Routine Testing)  Add-on,   AD     01/13/18 1539          Vitals/Pain Today's Vitals   01/13/18 1600 01/13/18 1630 01/13/18 1700 01/13/18 1730  BP: (!) 162/93 (!) 158/83 (!) 159/84 (!) 156/88  Pulse:      Resp: 20 19 18  (!) 22  Temp:      TempSrc:      SpO2:      Weight:      Height:      PainSc:        Isolation Precautions No active isolations  Medications Medications  furosemide (LASIX) 250 mg in dextrose 5 % 250 mL (1 mg/mL) infusion (has no administration in time range)  enoxaparin (LOVENOX) injection 40 mg (has no administration in time range)  acetaminophen (TYLENOL) tablet 650 mg (has no administration in time range)    Or  acetaminophen (TYLENOL) suppository 650 mg (has no administration in time range)  ondansetron (ZOFRAN) tablet 4 mg (has no administration in time range)    Or  ondansetron (ZOFRAN) injection 4 mg (has no administration in time range)  atorvastatin (LIPITOR) tablet 40 mg (has no administration in time range)  carvedilol (COREG) tablet 3.125 mg (has no administration in time range)  pantoprazole (PROTONIX) EC tablet 40 mg (has no administration in time range)  ferrous sulfate tablet 325 mg (has no administration in time range)  folic acid (FOLVITE) tablet 1 mg (has no administration in time range)  mometasone-formoterol (DULERA) 200-5 MCG/ACT inhaler 2 puff (has no administration in time range)  pregabalin (LYRICA) capsule 100 mg (has no administration in time range)  pregabalin (LYRICA) capsule 200 mg (has no administration in time range)  albuterol (PROVENTIL) (2.5 MG/3ML) 0.083% nebulizer solution 2.5 mg (has no administration  in time range)  insulin glargine (LANTUS) injection 16 Units (has no administration in time range)  irbesartan (AVAPRO) tablet 75 mg (has no administration in time range)  insulin aspart (novoLOG) injection 0-9 Units (has no administration in time range)  insulin aspart (novoLOG) injection 0-5 Units (has no administration in time range)  potassium chloride SA (K-DUR,KLOR-CON) CR tablet 20 mEq (has no administration in time range)  furosemide (LASIX) injection 40 mg (40 mg Intravenous Given 01/13/18 1441)    Mobility walks with device

## 2018-01-13 NOTE — H&P (Signed)
[facility] [place] NAME: [name]    MR#:  [id number]  DATE OF BIRTH:  1955/02/06  DATE OF ADMISSION:  01/13/2018  PRIMARY CARE PHYSICIAN: Patient, No Pcp Per   REQUESTING/REFERRING PHYSICIAN: Dr. Brenton Grills  CHIEF COMPLAINT:   Chief Complaint  Patient presents with  . Shortness of Breath    HISTORY OF PRESENT ILLNESS:  [name]  is a 62 y.o. female with a known history of CHF presents with shortness of breath and weight gain.  She states that everything went downhill since she had a heart surgery in [place] last year.  She cannot tell me exactly what they did.  She states that she is gained weight 20 to 30 pounds and a few months.  She is unable to walk around.  She is short of breath.  She is very swollen and can hardly put on her socks or shoes.  She is having trouble walking.  In the ER, she was found to be in congestive heart failure and hospitalist services were contacted for further evaluation.  PAST MEDICAL HISTORY:   Past Medical History:  Diagnosis Date  . Cervical cancer ([place])   . CHF (congestive heart failure) ([place])   . COPD (chronic obstructive pulmonary disease) ([place])   . Diabetes mellitus without complication ([place])   . NSTEMI (non-ST elevated myocardial infarction) ([place])     PAST SURGICAL HISTORY:   Past Surgical History:  Procedure Laterality Date  . APPENDECTOMY    . CARDIAC CATHETERIZATION    . CHOLECYSTECTOMY    . COLONOSCOPY N/A 09/28/2017   Procedure: COLONOSCOPY;  Surgeon: Toledo, Benay Pike, MD;  Location: [facility] ENDOSCOPY;  Service: Gastroenterology;  Laterality: N/A;  . ENTEROSCOPY Left 11/06/2017   Procedure: ENTEROSCOPY;  Surgeon: Jonathon Bellows, MD;  Location: [facility] ENDOSCOPY;  Service: Gastroenterology;  Laterality: Left;  . ESOPHAGOGASTRODUODENOSCOPY N/A 12/19/2016   Procedure: ESOPHAGOGASTRODUODENOSCOPY (EGD);  Surgeon: Lin Landsman, MD;  Location: [facility] ENDOSCOPY;   Service: Gastroenterology;  Laterality: N/A;  . ESOPHAGOGASTRODUODENOSCOPY N/A 09/28/2017   Procedure: ESOPHAGOGASTRODUODENOSCOPY (EGD);  Surgeon: Toledo, Benay Pike, MD;  Location: [facility] ENDOSCOPY;  Service: Gastroenterology;  Laterality: N/A;  . ESOPHAGOGASTRODUODENOSCOPY (EGD) WITH PROPOFOL N/A 08/08/2017   Procedure: ESOPHAGOGASTRODUODENOSCOPY (EGD) WITH PROPOFOL;  Surgeon: [name], MD;  Location: [facility] ENDOSCOPY;  Service: Endoscopy;  Laterality: N/A;  . heart [place]    . LEFT HEART CATH AND CORONARY ANGIOGRAPHY N/A 12/23/2016   Procedure: LEFT HEART CATH AND CORONARY ANGIOGRAPHY;  Surgeon: Teodoro Spray, MD;  Location: [facility] CV LAB;  Service: Cardiovascular;  Laterality: N/A;    SOCIAL HISTORY:   Social History   Tobacco Use  . Smoking status: Current Some Day Smoker    Packs/day: 0.25    Types: Cigarettes  . Smokeless tobacco: Never Used  Substance Use Topics  . Alcohol use: No    FAMILY HISTORY:   Family History  Problem Relation Age of Onset  . Hypertension Mother   . Diabetes Mellitus II Mother   . Breast cancer Mother   . Heart disease Father   . Cancer Sister     DRUG ALLERGIES:   Allergies  Allergen Reactions  . Latex Anaphylaxis and Rash  . Gabapentin Nausea And Vomiting and Other (See Comments)    REVIEW OF SYSTEMS:  CONSTITUTIONAL: No fever, chills or sweats.  Positive for fatigue.  Positive for 20 to 30 pound weight gain EYES: No blurred or double vision.  Wears glasses. EARS, NOSE, AND THROAT: No tinnitus or ear pain. No sore throat RESPIRATORY: Positive for cough with clear phlegm.  Positive for shortness of breath.  No wheezing or hemoptysis.  CARDIOVASCULAR: No chest pain, orthopnea, edema.  GASTROINTESTINAL: No nausea, vomiting, diarrhea or abdominal pain. No blood in bowel movements.  Some constipation GENITOURINARY: No dysuria, hematuria.  ENDOCRINE: No polyuria, nocturia,  HEMATOLOGY: No anemia, easy bruising or  bleeding SKIN: Positive for itching on her back arms and abdomen MUSCULOSKELETAL: Positive for leg and muscle pain NEUROLOGIC: No tingling, numbness, weakness.  PSYCHIATRY: No anxiety or depression.   MEDICATIONS AT HOME:   Prior to Admission medications   Medication Sig Start Date End Date Taking? Authorizing Provider  atorvastatin (LIPITOR) 40 MG tablet Take 1 tablet (40 mg total) by mouth daily. 08/29/17 01/13/18 Yes Dustin Flock, MD  carvedilol (COREG) 3.125 MG tablet Take 1 tablet (3.125 mg total) by mouth 2 (two) times daily with a meal. 08/29/17  Yes Dustin Flock, MD  ferrous sulfate 325 (65 FE) MG tablet Take 1 tablet (325 mg total) by mouth 2 (two) times daily with a meal. 09/30/17  Yes Epifanio Lesches, MD  folic acid (FOLVITE) 1 MG tablet Take 1 tablet (1 mg total) by mouth daily. 09/30/17  Yes Epifanio Lesches, MD  furosemide (LASIX) 40 MG tablet Take 1 tablet (40 mg total) by mouth 2 (two) times daily. 08/29/17 08/29/18 Yes Dustin Flock, MD  Insulin Glargine (LANTUS SOLOSTAR) 100 UNIT/ML Solostar Pen Inject 15 Units into the skin daily. Patient taking differently: Inject 16 Units into the skin daily.  08/29/17  Yes Dustin Flock, MD  Insulin Pen Needle 32G X 5 MM MISC 1 Units by Does not apply route daily. 12/24/16  Yes Sudini, Alveta Heimlich, MD  omeprazole (PRILOSEC) 40 MG capsule Take 1 capsule (40 mg total) by mouth daily. 11/06/17 11/06/18 Yes Dustin Flock, MD  ondansetron (ZOFRAN) 4 MG tablet Take 1 tablet (4 mg total) by mouth every 6 (six) hours as needed for nausea. 08/29/17  Yes Dustin Flock, MD  pregabalin (LYRICA) 100 MG capsule Take 100-200 mg by mouth See admin instructions. Take 1 capsule (100MG) by mouth every morning and 2 capsules (200MG) by mouth every night   Yes [provider]  PROAIR HFA 108 (90 Base) MCG/ACT inhaler Inhale 1-2 puffs into the lungs every 4 (four) hours as needed. 10/10/17  Yes [provider]  SYMBICORT 160-4.5  MCG/ACT inhaler Inhale 2 puffs into the lungs 2 (two) times daily. 10/10/17  Yes [provider]  blood glucose meter kit and supplies KIT Dispense based on patient and insurance preference. Use up to four times daily as directed. (FOR ICD-9 250.00, 250.01). Patient not taking: Reported on 01/13/2018 12/24/16   Hillary Bow, MD      VITAL SIGNS:  Blood pressure (!) 179/103, pulse 91, temperature 97.6 F (36.4 C), temperature source Oral, resp. rate (!) 24, height _0  (1.651 m), weight 84 kg, SpO2 100 %.  PHYSICAL EXAMINATION:  GENERAL:  62 y.o.-year-old patient lying in the bed with no acute distress.  EYES: Pupils equal, round, reactive to light and accommodation. No scleral icterus. Extraocular muscles intact.  HEENT: Head atraumatic, normocephalic. Oropharynx and nasopharynx clear.  NECK:  Supple, no jugular venous distention. No thyroid enlargement, no tenderness.  LUNGS: Decreased breath sounds bilateral, no wheezing, positive rales halfway up lung field.  No rhonchi or crepitation. No use of accessory muscles of respiration.  CARDIOVASCULAR: S1, S2 normal.  3  out of 6 systolic murmur.  No rubs, or gallops.  ABDOMEN: Soft, nontender, nondistended. Bowel sounds present. No organomegaly or mass.  EXTREMITIES: 3+ pedal edema.no cyanosis, or clubbing.  NEUROLOGIC: Cranial nerves II through XII are intact. Muscle strength 5/5 in all extremities. Sensation intact. Gait not checked.  PSYCHIATRIC: The patient is alert and oriented x 3.  SKIN: No rash, lesion, or ulcer.   LABORATORY PANEL:   CBC Recent Labs  Lab 01/13/18 1148  WBC 6.5  HGB 9.5*  HCT 34.0*  PLT 165   ------------------------------------------------------------------------------------------------------------------  Chemistries  Recent Labs  Lab 01/13/18 1148  NA 138  K 3.7  CL 107  CO2 21*  GLUCOSE 114*  BUN 17  CREATININE 1.25*  CALCIUM 9.0    ------------------------------------------------------------------------------------------------------------------  Cardiac Enzymes Recent Labs  Lab 01/13/18 1148  TROPONINI <0.03   ------------------------------------------------------------------------------------------------------------------  RADIOLOGY:  Dg Chest 2 View  Result Date: 01/13/2018 CLINICAL DATA:  Patient reports acute on chronic bilateral LE swelling and SOB worsening over the past 3 days. Denies CP, cough or fever. Hx NSTEMI, DM, COPD, CHF, cervical ca, cholecystectomy, open heart surgery approx. 1 year ago. Current smoker. EXAM: CHEST - 2 VIEW COMPARISON:  09/26/2017 FINDINGS: Patchy airspace opacities in the right middle lobe and both lower lobes, new since previous. Bilateral perihilar interstitial prominence, new since previous. Heart size upper limits normal. Previous CABG, AVR, median sternotomy. New small bilateral pleural effusions. Anterior vertebral endplate spurring at multiple levels in the mid and lower thoracic spine. IMPRESSION: New bilateral edema/infiltrates and small effusions. Electronically Signed   By: Lucrezia Europe M.D.   On: 01/13/2018 12:50    EKG:   Sinus rhythm, 87 bpm, right bundle branch block  IMPRESSION AND PLAN:   1.  Acute diastolic congestive heart failure with moderate to severe mitral regurgitation.  Patient also has anasarca.  Start Lasix drip.  Continue beta-blocker.  Obtain a new echocardiogram and cardiology consultation. 2.  Accelerated hypertension.  Add irbesartan.  Continue Coreg and starting Lasix drip. 3.  Type 2 diabetes mellitus.  Put on sliding scale. 4.  COPD.  Respiratory status stable.  Continue inhalers. 5.  Iron deficiency anemia.  Start oral iron.  Once we get rid of little fluid may benefit from some IV iron. 6.  Weakness.  Physical therapy evaluation.    All the records are reviewed and case discussed with ED provider. Management plans discussed with the  patient, family and they are in agreement.  CODE STATUS: Full code  TOTAL TIME TAKING CARE OF THIS PATIENT: 50 minutes, including acp time.    Loletha Grayer M.D on 01/13/2018 at 3:45 PM  Between 7am to 6pm - Pager - [phone]  After 6pm call admission pager [phone]  [facility]  [phone]  CC: Primary care physician; Patient, No Pcp Per

## 2018-01-13 NOTE — ED Triage Notes (Signed)
Pt in via [facility] from home with complaints of worsening shortness of breath.  Per EMS, pt 94% on room air, pt on 2L nasal cannula upon arrival.  NAD noted at this time.

## 2018-01-14 LAB — BASIC METABOLIC PANEL
Anion gap: 5 (ref 5–15)
BUN: 19 mg/dL (ref 8–23)
CHLORIDE: 108 mmol/L (ref 98–111)
CO2: 25 mmol/L (ref 22–32)
Calcium: 8.4 mg/dL — ABNORMAL LOW (ref 8.9–10.3)
Creatinine, Ser: 1.33 mg/dL — ABNORMAL HIGH (ref 0.44–1.00)
GFR calc Af Amer: 49 mL/min — ABNORMAL LOW (ref >60)
GFR calc non Af Amer: 42 mL/min — ABNORMAL LOW (ref >60)
Glucose, Bld: 120 mg/dL — ABNORMAL HIGH (ref 70–99)
Potassium: 4 mmol/L (ref 3.5–5.1)
SODIUM: 138 mmol/L (ref 135–145)

## 2018-01-14 LAB — GLUCOSE, CAPILLARY
GLUCOSE-CAPILLARY: 105 mg/dL — AB (ref 70–99)
Glucose-Capillary: 94 mg/dL (ref 70–99)
Glucose-Capillary: 168 mg/dL — ABNORMAL HIGH (ref 70–99)
Glucose-Capillary: 167 mg/dL — ABNORMAL HIGH (ref 70–99)

## 2018-01-14 LAB — CBC
HEMATOCRIT: 30.4 % — AB (ref 36.0–46.0)
Hemoglobin: 8.6 g/dL — ABNORMAL LOW (ref 12.0–15.0)
MCH: 21.9 pg — ABNORMAL LOW (ref 26.0–34.0)
MCHC: 28.3 g/dL — ABNORMAL LOW (ref 30.0–36.0)
MCV: 77.4 fL — ABNORMAL LOW (ref 80.0–100.0)
Platelets: 147 10*3/uL — ABNORMAL LOW (ref 150–400)
RBC: 3.93 MIL/uL (ref 3.87–5.11)
RDW: 23.7 % — ABNORMAL HIGH (ref 11.5–15.5)
WBC: 6.1 10*3/uL (ref 4.0–10.5)
nRBC: 0 % (ref 0.0–0.2)

## 2018-01-14 LAB — MAGNESIUM: Magnesium: 1.8 mg/dL (ref 1.7–2.4)

## 2018-01-14 LAB — TROPONIN I: Troponin I: <0.03 ng/mL (ref <0.03)

## 2018-01-14 MED ORDER — DIPHENHYDRAMINE HCL 25 MG PO CAPS
25.0000 mg | ORAL_CAPSULE | Freq: Four times a day (QID) | ORAL | Status: DC | PRN
  Administered 2018-01-14: 25 mg via ORAL
  Filled 2018-01-14: qty 1

## 2018-01-14 MED ORDER — MAGNESIUM SULFATE 2 GM/50ML IV SOLN
2.0000 g | Freq: Once | INTRAVENOUS | Status: AC
  Administered 2018-01-14: 2 g via INTRAVENOUS
  Filled 2018-01-14: qty 50

## 2018-01-14 NOTE — Evaluation (Signed)
Physical Therapy Evaluation Patient Details Name: [name] MRN: [id number] DOB: July 02, 1955 Today's Date: 01/14/2018   History of Present Illness  Patient is a 62 year old female who was admitted with SOB. Patient lives at home with husband. Patient recently had heart surgery and reports that since then things have just been going downhill.     Clinical Impression  Patient agrees to participate with therapy. Reports she is breathing better today. Patient requires increased time for all mobility. Use of rails to perform supine to sit. Patient ambulated in room x 25 feet using rolling walker with supervision.  Patient cued for pursed lip breathing after walking. O2 sats dropped to 86% after ambulation on room air. Once patient returned to O2 via nasal cannula sats quickly returned to 96%. Patient reports mild SOB with activity. Patient will benefit from continued PT while here at [facility] to improve activity tolerance and return to independence.      Follow Up Recommendations Home health PT    Equipment Recommendations  Rolling walker with 5" wheels    Recommendations for Other Services       Precautions / Restrictions Precautions Precautions: Fall Restrictions Weight Bearing Restrictions: No      Mobility  Bed Mobility Overal bed mobility: Modified Independent             General bed mobility comments: increased time and bed rails needed  Transfers Overall transfer level: Modified independent Equipment used: Rolling walker (2 wheeled)             General transfer comment: patient requires increased time and cues for hand placement with transfers.   Ambulation/Gait Ambulation/Gait assistance: Modified independent (Device/Increase time) Gait Distance (Feet): 20 Feet Assistive device: Rolling walker (2 wheeled) Gait Pattern/deviations: Step-to pattern Gait velocity: decreased      Stairs            Wheelchair Mobility    Modified Rankin (Stroke  Patients Only)       Balance Overall balance assessment: Modified Independent                                           Pertinent Vitals/Pain Pain Assessment: Faces Faces Pain Scale: Hurts even more Pain Location: feet Pain Descriptors / Indicators: Sharp;Tender Pain Intervention(s): Monitored during session    Home Living Family/patient expects to be discharged to:: Private residence Living Arrangements: Spouse/significant other Available Help at Discharge: Family Type of Home: House Home Access: Stairs to enter Entrance Stairs-Rails: Can reach both Entrance Stairs-Number of Steps: 6 Home Layout: One level Home Equipment: Shower seat - built in      Prior Function Level of Independence: Needs assistance   Gait / Transfers Assistance Needed: Patient reports she does not have a walker at home and needs one. Does not have O2 at home per her report.  ADL's / Homemaking Assistance Needed: Requires assistance from husband for ADLS and IADLs.         Hand Dominance   Dominant Hand: Right    Extremity/Trunk Assessment   Upper Extremity Assessment Upper Extremity Assessment: Overall WFL for tasks assessed    Lower Extremity Assessment Lower Extremity Assessment: Overall WFL for tasks assessed       Communication   Communication: No difficulties  Cognition Arousal/Alertness: Awake/alert Behavior During Therapy: WFL for tasks assessed/performed Overall Cognitive Status: Within Functional Limits for tasks assessed  General Comments      Exercises     Assessment/Plan    PT Assessment Patient needs continued PT services  PT Problem List Decreased strength;Decreased mobility;Decreased activity tolerance;Pain;Decreased knowledge of use of DME;Decreased safety awareness       PT Treatment Interventions DME instruction;Gait training;Therapeutic activities;Therapeutic exercise;Functional  mobility training;Patient/family education;Other (comment)(pursed lip breathing)    PT Goals (Current goals can be found in the Care Plan section)  Acute Rehab PT Goals Patient Stated Goal: to return home PT Goal Formulation: With patient Time For Goal Achievement: 01/28/18 Potential to Achieve Goals: Good    Frequency Min 2X/week   Barriers to discharge        Co-evaluation               AM-PAC PT "6 Clicks" Mobility  Outcome Measure Help needed turning from your back to your side while in a flat bed without using bedrails?: A Little Help needed moving from lying on your back to sitting on the side of a flat bed without using bedrails?: A Little Help needed moving to and from a bed to a chair (including a wheelchair)?: A Little Help needed standing up from a chair using your arms (e.g., wheelchair or bedside chair)?: A Little Help needed to walk in hospital room?: A Little Help needed climbing 3-5 steps with a railing? : A Little 6 Click Score: 18    End of Session Equipment Utilized During Treatment: Gait belt Activity Tolerance: Patient tolerated treatment well Patient left: in chair;with bed alarm set;with call bell/phone within reach Nurse Communication: Mobility status PT Visit Diagnosis: Other abnormalities of gait and mobility (R26.89);Muscle weakness (generalized) (M62.81);Pain Pain - Right/Left: Right(bilateral) Pain - part of body: Ankle and joints of foot    Time: 1020-1053 PT Time Calculation (min) (ACUTE ONLY): 33 min   Charges:   PT Evaluation $PT Eval Low Complexity: 1 Low PT Treatments $Gait Training: 8-22 mins $Therapeutic Activity: 8-22 mins        Felipe Paluch, PT, GCS 01/14/18,12:18 PM

## 2018-01-14 NOTE — Consult Note (Signed)
Reason for Consult: Congestive heart failure diastolic dysfunction shortness of breath Referring Physician: Dr. Titus Mould hospitalist  [name] is an 62 y.o. female.  HPI: 62 year old black female known congestive heart failure shortness of breath recent weight gain and anasarca patient reportedly had coronary bypass surgery at [facility] last year and she is not done well since patient also has been noncompliant with her medication over the past 2 weeks and has had progressive shortness of breath dyspnea leg edema.  Patient states of had 20-30 pound weight gain.  Patient has had mostly shortness of breath and leg edema and dyspnea no significant chest pain still smokes  Past Medical History:  Diagnosis Date  . Cervical cancer ([place])   . CHF (congestive heart failure) ([place])   . COPD (chronic obstructive pulmonary disease) ([place])   . Diabetes mellitus without complication ([place])   . NSTEMI (non-ST elevated myocardial infarction) [facility])     Past Surgical History:  Procedure Laterality Date  . APPENDECTOMY    . CARDIAC CATHETERIZATION    . CHOLECYSTECTOMY    . COLONOSCOPY N/A 09/28/2017   Procedure: COLONOSCOPY;  Surgeon: Toledo, Benay Pike, MD;  Location: [facility] ENDOSCOPY;  Service: Gastroenterology;  Laterality: N/A;  . ENTEROSCOPY Left 11/06/2017   Procedure: ENTEROSCOPY;  Surgeon: Jonathon Bellows, MD;  Location: [facility] ENDOSCOPY;  Service: Gastroenterology;  Laterality: Left;  . ESOPHAGOGASTRODUODENOSCOPY N/A 12/19/2016   Procedure: ESOPHAGOGASTRODUODENOSCOPY (EGD);  Surgeon: Lin Landsman, MD;  Location: [facility] ENDOSCOPY;  Service: Gastroenterology;  Laterality: N/A;  . ESOPHAGOGASTRODUODENOSCOPY N/A 09/28/2017   Procedure: ESOPHAGOGASTRODUODENOSCOPY (EGD);  Surgeon: Toledo, Benay Pike, MD;  Location: [facility] ENDOSCOPY;  Service: Gastroenterology;  Laterality: N/A;  . ESOPHAGOGASTRODUODENOSCOPY (EGD) WITH PROPOFOL N/A 08/08/2017   Procedure: ESOPHAGOGASTRODUODENOSCOPY  (EGD) WITH PROPOFOL;  Surgeon: [name], MD;  Location: [facility] ENDOSCOPY;  Service: Endoscopy;  Laterality: N/A;  . [place]    . LEFT HEART CATH AND CORONARY ANGIOGRAPHY N/A 12/23/2016   Procedure: LEFT HEART CATH AND CORONARY ANGIOGRAPHY;  Surgeon: Teodoro Spray, MD;  Location: [place] CV LAB;  Service: Cardiovascular;  Laterality: N/A;    Family History  Problem Relation Age of Onset  . Hypertension Mother   . Diabetes Mellitus II Mother   . Breast cancer Mother   . Heart disease Father   . Cancer Sister     Social History:  reports that she has been smoking cigarettes. She has been smoking about 0.25 packs per day. She has never used smokeless tobacco. She reports that she does not drink alcohol or use drugs.  Allergies:  Allergies  Allergen Reactions  . Latex Anaphylaxis and Rash  . Gabapentin Nausea And Vomiting and Other (See Comments)    Medications: I have reviewed the patient's current medications.  Results for orders placed or performed during the hospital encounter of 01/13/18 (from the past 48 hour(s))  Basic metabolic panel     Status: Abnormal   Collection Time: 01/13/18 11:48 AM  Result Value Ref Range   Sodium 138 135 - 145 mmol/L   Potassium 3.7 3.5 - 5.1 mmol/L   Chloride 107 98 - 111 mmol/L   CO2 21 (L) 22 - 32 mmol/L   Glucose, Bld 114 (H) 70 - 99 mg/dL   BUN 17 8 - 23 mg/dL   Creatinine, Ser 1.25 (H) 0.44 - 1.00 mg/dL   Calcium 9.0 8.9 - 10.3 mg/dL   GFR calc non Af Amer 46 (L) >60 mL/min   GFR  calc Af Amer 53 (L) >60 mL/min   Anion gap 10 5 - 15    Comment: Performed at [facility], [address] [place], [place] [address]  Troponin I - Once     Status: None   Collection Time: 01/13/18 11:48 AM  Result Value Ref Range   Troponin I <0.03 <0.03 ng/mL    Comment: Performed at [facility], [address] [place], [place] [address]  CBC with Differential     Status: Abnormal   Collection Time:  01/13/18 11:48 AM  Result Value Ref Range   WBC 6.5 4.0 - 10.5 K/uL   RBC 4.34 3.87 - 5.11 MIL/uL   Hemoglobin 9.5 (L) 12.0 - 15.0 g/dL   HCT 34.0 (L) 36.0 - 46.0 %   MCV 78.3 (L) 80.0 - 100.0 fL   MCH 21.9 (L) 26.0 - 34.0 pg   MCHC 27.9 (L) 30.0 - 36.0 g/dL   RDW 24.2 (H) 11.5 - 15.5 %   Platelets 165 150 - 400 K/uL   nRBC 0.0 0.0 - 0.2 %   Neutrophils Relative % 75 %   Neutro Abs 4.9 1.7 - 7.7 K/uL   Lymphocytes Relative 17 %   Lymphs Abs 1.1 0.7 - 4.0 K/uL   Monocytes Relative 6 %   Monocytes Absolute 0.4 0.1 - 1.0 K/uL   Eosinophils Relative 1 %   Eosinophils Absolute 0.1 0.0 - 0.5 K/uL   Basophils Relative 1 %   Basophils Absolute 0.1 0.0 - 0.1 K/uL   WBC Morphology MORPHOLOGY UNREMARKABLE    Smear Review Normal platelet morphology    Immature Granulocytes 0 %   Abs Immature Granulocytes 0.02 0.00 - 0.07 K/uL   Polychromasia PRESENT     Comment: Performed at [facility], [address] [place], [place] [address]  Brain natriuretic peptide     Status: Abnormal   Collection Time: 01/13/18 11:48 AM  Result Value Ref Range   B Natriuretic Peptide 1,068.0 (H) 0.0 - 100.0 pg/mL    Comment: Performed at [facility], [address] [place], [place] [address]  Troponin I - Once     Status: None   Collection Time: 01/13/18  7:52 PM  Result Value Ref Range   Troponin I <0.03 <0.03 ng/mL    Comment: Performed at [facility], [address] [place],  [address]  Glucose, capillary     Status: Abnormal   Collection Time: 01/13/18  9:23 PM  Result Value Ref Range   Glucose-Capillary 161 (H) 70 - 99 mg/dL   Comment 1 Notify RN     Dg Chest 2 View  Result Date: 01/13/2018 CLINICAL DATA:  Patient reports acute on chronic bilateral LE swelling and SOB worsening over the past 3 days. Denies CP, cough or fever. Hx NSTEMI, DM, COPD, CHF, cervical ca, cholecystectomy, open heart surgery approx. 1 year ago. Current smoker. EXAM: CHEST - 2 VIEW  COMPARISON:  09/26/2017 FINDINGS: Patchy airspace opacities in the right middle lobe and both lower lobes, new since previous. Bilateral perihilar interstitial prominence, new since previous. Heart size upper limits normal. Previous CABG, AVR, median sternotomy. New small bilateral pleural effusions. Anterior vertebral endplate spurring at multiple levels in the mid and lower thoracic spine. IMPRESSION: New bilateral edema/infiltrates and small effusions. Electronically Signed   By: Lucrezia Europe M.D.   On: 01/13/2018 12:50   US Venous Img Lower Bilateral  Result Date: 01/13/2018 CLINICAL DATA:  Pain. EXAM: BILATERAL LOWER  EXTREMITY VENOUS DOPPLER ULTRASOUND TECHNIQUE: Gray-scale sonography with graded compression, as well as color Doppler and duplex ultrasound were performed to evaluate the lower extremity deep venous systems from the level of the common femoral vein and including the common femoral, femoral, profunda femoral, popliteal and calf veins including the posterior tibial, peroneal and gastrocnemius veins when visible. The superficial great saphenous vein was also interrogated. Spectral Doppler was utilized to evaluate flow at rest and with distal augmentation maneuvers in the common femoral, femoral and popliteal veins. COMPARISON:  None. FINDINGS: RIGHT LOWER EXTREMITY Common Femoral Vein: No evidence of thrombus. Normal compressibility, respiratory phasicity and response to augmentation. Saphenofemoral Junction: No evidence of thrombus. Normal compressibility and flow on color Doppler imaging. Profunda Femoral Vein: No evidence of thrombus. Normal compressibility and flow on color Doppler imaging. Femoral Vein: No evidence of thrombus. Normal compressibility, respiratory phasicity and response to augmentation. Popliteal Vein: No evidence of thrombus. Normal compressibility, respiratory phasicity and response to augmentation. Calf Veins: No evidence of thrombus. Normal compressibility and flow on  color Doppler imaging. Superficial Great Saphenous Vein: No evidence of thrombus. Normal compressibility. Venous Reflux:  None. Other Findings:  None. LEFT LOWER EXTREMITY Common Femoral Vein: No evidence of thrombus. Normal compressibility, respiratory phasicity and response to augmentation. Saphenofemoral Junction: No evidence of thrombus. Normal compressibility and flow on color Doppler imaging. Profunda Femoral Vein: No evidence of thrombus. Normal compressibility and flow on color Doppler imaging. Femoral Vein: No evidence of thrombus. Normal compressibility, respiratory phasicity and response to augmentation. Popliteal Vein: No evidence of thrombus. Normal compressibility, respiratory phasicity and response to augmentation. Calf Veins: Limited evaluation of the LEFT calf veins due to LOWER extremity edema. Superficial Great Saphenous Vein: No evidence of thrombus. Normal compressibility. Venous Reflux:  None. Other Findings:  None. IMPRESSION: No evidence of deep venous thrombosis. Electronically Signed   By: Nolon Nations M.D.   On: 01/13/2018 17:50    Review of Systems  Constitutional: Positive for diaphoresis and malaise/fatigue.  HENT: Positive for congestion.   Eyes: Negative.   Respiratory: Positive for shortness of breath.   Cardiovascular: Positive for chest pain, orthopnea, leg swelling and PND.  Gastrointestinal: Negative.   Genitourinary: Negative.   Musculoskeletal: Negative.   Skin: Negative.   Neurological: Positive for weakness.  Endo/Heme/Allergies: Negative.   Psychiatric/Behavioral: Negative.    Blood pressure (!) 144/86, pulse 87, temperature 98.2 F (36.8 C), temperature source Oral, resp. rate (!) 24, height 5' 5"  (1.651 m), weight 84 kg, SpO2 100 %. Physical Exam  Nursing note and vitals reviewed. Constitutional: She is oriented to person, place, and time. She appears well-developed and well-nourished.  HENT:  Head: Normocephalic and atraumatic.  Eyes: Pupils  are equal, round, and reactive to light. Conjunctivae and EOM are normal.  Neck: Normal range of motion. Neck supple.  Cardiovascular: Normal rate. Exam reveals gallop.  Murmur heard. Respiratory: She has decreased breath sounds. She has rhonchi. She has rales.  GI: Soft. Bowel sounds are normal.  Musculoskeletal:        General: Edema present.  Neurological: She is alert and oriented to person, place, and time. She has normal reflexes.  Skin: Skin is warm and dry.  Psychiatric: She has a normal mood and affect.    Assessment/Plan: Congestive heart failure diastolic dysfunction Shortness of breath Edema Smoking Diabetes Possible non-STEMI Murmur Hyperlipidemia GERD Anemia . Plan Recommend admission to the telemetry for further assessment evaluation Supplemental oxygen as necessary Diuretic therapy to help with heart failure Agree  with echocardiogram for further assessment Inhalers for COPD symptoms Continue diabetes management and control Maintain antihypertensive therapy for poorly controlled hypertension Continue current therapy for heart failure Consider functional study versus cardiac cath  Adriane Guglielmo D Braiden Presutti 01/14/2018, 12:39 AM

## 2018-01-14 NOTE — Progress Notes (Signed)
Patient ID: [name], female   DOB: [birth year], 62 y.o.   MRN: [id number]  [employer] PROGRESS NOTE  [name] DOB: 21-Apr-1955 DOA: 01/13/2018 PCP: Patient, No Pcp Per  HPI/Subjective: Patient still feeling some pain in her legs and some tenderness.  Feeling better with regards to her breathing but she states is because the oxygen is on.  Objective: Vitals:   01/14/18 0747 01/14/18 1205  BP: (!) 136/93   Pulse: 88   Resp: (!) 22   Temp: 98.2 F (36.8 C)   SpO2: 95% 95%    Intake/Output Summary (Last 24 hours) at 01/14/2018 1527 Last data filed at 01/14/2018 1200 Gross per 24 hour  Intake 527.96 ml  Output 4150 ml  Net -3622.04 ml   Filed Weights   01/13/18 1123 01/14/18 0500 01/14/18 0859  Weight: 84 kg 90.9 kg 89.6 kg    ROS: Review of Systems  Constitutional: Negative for chills and fever.  Eyes: Negative for blurred vision.  Respiratory: Positive for cough and shortness of breath.   Cardiovascular: Negative for chest pain.  Gastrointestinal: Negative for abdominal pain, constipation, diarrhea, nausea and vomiting.  Genitourinary: Negative for dysuria.  Musculoskeletal: Positive for joint pain.  Neurological: Negative for dizziness and headaches.   Exam: Physical Exam  Constitutional: She is oriented to person, place, and time.  HENT:  Nose: No mucosal edema.  Mouth/Throat: No oropharyngeal exudate or posterior oropharyngeal edema.  Eyes: Pupils are equal, round, and reactive to light. Conjunctivae, EOM and lids are normal.  Neck: No JVD present. Carotid bruit is not present. No edema present. No thyroid mass and no thyromegaly present.  Cardiovascular: S1 normal and S2 normal. Exam reveals no gallop.  No murmur heard. Pulses:      Dorsalis pedis pulses are 2+ on the right side and 2+ on the left side.  Respiratory: No respiratory distress. She has decreased breath sounds in the right lower field and the left lower field.  She has no wheezes. She has no rhonchi. She has rales in the right lower field and the left lower field.  GI: Soft. Bowel sounds are normal. There is no abdominal tenderness.  Musculoskeletal:     Right knee: She exhibits swelling.     Left knee: She exhibits swelling.     Right ankle: She exhibits swelling.     Left ankle: She exhibits swelling.  Lymphadenopathy:    She has no cervical adenopathy.  Neurological: She is alert and oriented to person, place, and time. No cranial nerve deficit.  Skin: Skin is warm. No rash noted. Nails show no clubbing.  Psychiatric: She has a normal mood and affect.      Data Reviewed: Basic Metabolic Panel: Recent Labs  Lab 01/13/18 1148 01/14/18 0140  NA 138 138  K 3.7 4.0  CL 107 108  CO2 21* 25  GLUCOSE 114* 120*  BUN 17 19  CREATININE 1.25* 1.33*  CALCIUM 9.0 8.4*  MG  --  1.8   CBC: Recent Labs  Lab 01/13/18 1148 01/14/18 0140  WBC 6.5 6.1  NEUTROABS 4.9  --   HGB 9.5* 8.6*  HCT 34.0* 30.4*  MCV 78.3* 77.4*  PLT 165 147*   Cardiac Enzymes: Recent Labs  Lab 01/13/18 1148 01/13/18 1952 01/14/18 0140  TROPONINI <0.03 <0.03 <0.03   BNP (last 3 results) Recent Labs    06/22/17 1213 08/26/17 1751 01/13/18 1148  BNP 587.0* 1,095.0* 1,068.0*     CBG: Recent Labs  Lab 01/13/18 2123 01/14/18 0749 01/14/18 1157  GLUCAP 161* 94 168*     Studies: Dg Chest 2 View  Result Date: 01/13/2018 CLINICAL DATA:  Patient reports acute on chronic bilateral LE swelling and SOB worsening over the past 3 days. Denies CP, cough or fever. Hx NSTEMI, DM, COPD, CHF, cervical ca, cholecystectomy, open heart surgery approx. 1 year ago. Current smoker. EXAM: CHEST - 2 VIEW COMPARISON:  09/26/2017 FINDINGS: Patchy airspace opacities in the right middle lobe and both lower lobes, new since previous. Bilateral perihilar interstitial prominence, new since previous. Heart size upper limits normal. Previous CABG, AVR, median sternotomy. New  small bilateral pleural effusions. Anterior vertebral endplate spurring at multiple levels in the mid and lower thoracic spine. IMPRESSION: New bilateral edema/infiltrates and small effusions. Electronically Signed   By: Lucrezia Europe M.D.   On: 01/13/2018 12:50   US Venous Img Lower Bilateral  Result Date: 01/13/2018 CLINICAL DATA:  Pain. EXAM: BILATERAL LOWER EXTREMITY VENOUS DOPPLER ULTRASOUND TECHNIQUE: Gray-scale sonography with graded compression, as well as color Doppler and duplex ultrasound were performed to evaluate the lower extremity deep venous systems from the level of the common femoral vein and including the common femoral, femoral, profunda femoral, popliteal and calf veins including the posterior tibial, peroneal and gastrocnemius veins when visible. The superficial great saphenous vein was also interrogated. Spectral Doppler was utilized to evaluate flow at rest and with distal augmentation maneuvers in the common femoral, femoral and popliteal veins. COMPARISON:  None. FINDINGS: RIGHT LOWER EXTREMITY Common Femoral Vein: No evidence of thrombus. Normal compressibility, respiratory phasicity and response to augmentation. Saphenofemoral Junction: No evidence of thrombus. Normal compressibility and flow on color Doppler imaging. Profunda Femoral Vein: No evidence of thrombus. Normal compressibility and flow on color Doppler imaging. Femoral Vein: No evidence of thrombus. Normal compressibility, respiratory phasicity and response to augmentation. Popliteal Vein: No evidence of thrombus. Normal compressibility, respiratory phasicity and response to augmentation. Calf Veins: No evidence of thrombus. Normal compressibility and flow on color Doppler imaging. Superficial Great Saphenous Vein: No evidence of thrombus. Normal compressibility. Venous Reflux:  None. Other Findings:  None. LEFT LOWER EXTREMITY Common Femoral Vein: No evidence of thrombus. Normal compressibility, respiratory phasicity and  response to augmentation. Saphenofemoral Junction: No evidence of thrombus. Normal compressibility and flow on color Doppler imaging. Profunda Femoral Vein: No evidence of thrombus. Normal compressibility and flow on color Doppler imaging. Femoral Vein: No evidence of thrombus. Normal compressibility, respiratory phasicity and response to augmentation. Popliteal Vein: No evidence of thrombus. Normal compressibility, respiratory phasicity and response to augmentation. Calf Veins: Limited evaluation of the LEFT calf veins due to LOWER extremity edema. Superficial Great Saphenous Vein: No evidence of thrombus. Normal compressibility. Venous Reflux:  None. Other Findings:  None. IMPRESSION: No evidence of deep venous thrombosis. Electronically Signed   By: Nolon Nations M.D.   On: 01/13/2018 17:50    Scheduled Meds: . atorvastatin  40 mg Oral q1800  . carvedilol  3.125 mg Oral BID WC  . enoxaparin (LOVENOX) injection  40 mg Subcutaneous Q24H  . ferrous sulfate  325 mg Oral BID WC  . folic acid  1 mg Oral Daily  . insulin aspart  0-5 Units Subcutaneous QHS  . insulin aspart  0-9 Units Subcutaneous TID WC  . insulin glargine  16 Units Subcutaneous Daily  . irbesartan  75 mg Oral Daily  . mometasone-formoterol  2 puff Inhalation BID  . pantoprazole  40 mg Oral Daily  . potassium chloride  20 mEq Oral BID  . pregabalin  100 mg Oral Daily  . pregabalin  200 mg Oral QHS   Continuous Infusions: . furosemide (LASIX) infusion 6 mg/hr (01/13/18 2159)    Assessment/Plan:  1. Acute diastolic congestive heart failure with moderate to severe mitral regurgitation.  Patient also has anasarca.  Continue Lasix drip.  Continue beta-blocker.  Added irbesartan.  Repeat echocardiogram ordered but not done yet.  [place] cardiology consultation.  Daily weights.  Good urinary output over the last day. 2. Hypertension.  Blood pressure better controlled today with Lasix drip Coreg and irbesartan 3. Type 2 diabetes  mellitus on sliding scale 4. COPD.  Respiratory status stable.  Continue inhalers. 5. Iron deficiency anemia.  Continue oral iron.  Potentially tomorrow can give the IV iron. 6. Weakness.  Appreciate physical therapy evaluation 7. Chronic kidney disease stage III.  Watch with diuresis  Code Status:     Code Status Orders  (From admission, onward)         Start     Ordered   01/13/18 1538  Full code  Continuous     01/13/18 1539        Code Status History    Date Active Date Inactive Code Status Order ID Comments User Context   11/03/2017 1758 11/06/2017 2136 Full Code [id number]  [name], MD Inpatient   09/26/2017 2353 09/30/2017 1404 Full Code [id number]  Lance Coon, MD Inpatient   08/27/2017 0347 08/29/2017 2054 Full Code [id number]  Harrie Foreman, MD Inpatient   08/07/2017 1704 08/09/2017 2237 Full Code [id number]  Saundra Shelling, MD Inpatient   12/18/2016 1146 12/26/2016 2032 Full Code [id number]  Vaughan Basta, MD Inpatient   12/18/2016 0959 12/18/2016 1146 Full Code [id number]  Vaughan Basta, MD ED   12/04/2015 0509 12/07/2015 1756 Full Code [id number]  Holley Raring, NP ED   10/28/2015 1640 10/29/2015 1711 Full Code [id number]  Bettey Costa, MD Inpatient   08/29/2015 1438 08/30/2015 1750 Full Code [id number]  Coleman, DO Inpatient      Disposition Plan: Likely will need a few days of diuresis.  Consultants:  Cardiology  Time spent: 28 minutes  [place]

## 2018-01-15 ENCOUNTER — Inpatient Hospital Stay
Admit: 2018-01-15 | Discharge: 2018-01-15 | Disposition: A | Discharge status: Home / Self Care | Payer: Medicaid Other | Attending: Internal Medicine | Admitting: Internal Medicine

## 2018-01-15 LAB — HIV ANTIBODY (ROUTINE TESTING W REFLEX): HIV Screen 4th Generation wRfx: NONREACTIVE

## 2018-01-15 LAB — BASIC METABOLIC PANEL
Anion gap: 8 (ref 5–15)
BUN: 26 mg/dL — ABNORMAL HIGH (ref 8–23)
CO2: 25 mmol/L (ref 22–32)
Calcium: 8.3 mg/dL — ABNORMAL LOW (ref 8.9–10.3)
Chloride: 103 mmol/L (ref 98–111)
Creatinine, Ser: 1.49 mg/dL — ABNORMAL HIGH (ref 0.44–1.00)
GFR calc Af Amer: 43 mL/min — ABNORMAL LOW (ref >60)
GFR calc non Af Amer: 37 mL/min — ABNORMAL LOW (ref >60)
Glucose, Bld: 148 mg/dL — ABNORMAL HIGH (ref 70–99)
POTASSIUM: 4.2 mmol/L (ref 3.5–5.1)
Sodium: 136 mmol/L (ref 135–145)

## 2018-01-15 LAB — GLUCOSE, CAPILLARY
Glucose-Capillary: 112 mg/dL — ABNORMAL HIGH (ref 70–99)
Glucose-Capillary: 125 mg/dL — ABNORMAL HIGH (ref 70–99)
Glucose-Capillary: 129 mg/dL — ABNORMAL HIGH (ref 70–99)
Glucose-Capillary: 146 mg/dL — ABNORMAL HIGH (ref 70–99)

## 2018-01-15 LAB — RETICULOCYTES
IMMATURE RETIC FRACT: 20.6 % — AB (ref 2.3–15.9)
RBC.: 4.74 MIL/uL (ref 3.87–5.11)
Retic Count, Absolute: 50.1 10*3/uL (ref 19.0–186.0)
Retic Ct Pct: 1.2 % (ref 0.4–3.1)

## 2018-01-15 LAB — IRON AND TIBC
Iron: 30 ug/dL (ref 28–170)
Saturation Ratios: 11 % (ref 10.4–31.8)
TIBC: 284 ug/dL (ref 250–450)
UIBC: 255 ug/dL

## 2018-01-15 LAB — VITAMIN B12: Vitamin B-12: 547 pg/mL (ref 180–914)

## 2018-01-15 LAB — FOLATE: Folate: 10.9 ng/mL (ref >5.9)

## 2018-01-15 LAB — ECHOCARDIOGRAM COMPLETE
HEIGHTINCHES: 65 in
Weight: 3115.2 oz

## 2018-01-15 LAB — BRAIN NATRIURETIC PEPTIDE: B Natriuretic Peptide: 596 pg/mL — ABNORMAL HIGH (ref 0.0–100.0)

## 2018-01-15 LAB — FERRITIN: Ferritin: 34 ng/mL (ref 11–307)

## 2018-01-15 MED ORDER — FUROSEMIDE 10 MG/ML IJ SOLN
40.0000 mg | Freq: Two times a day (BID) | INTRAMUSCULAR | Status: DC
  Administered 2018-01-15 (×2): 40 mg via INTRAVENOUS
  Filled 2018-01-15 (×2): qty 4

## 2018-01-15 NOTE — Progress Notes (Signed)
[facility] [place] NAME: [name]    MR#:  [id number]  DATE OF BIRTH:  29-Apr-1955  SUBJECTIVE:  CHIEF COMPLAINT:   Chief Complaint  Patient presents with  . Shortness of Breath   -Feels much better today, off oxygen this morning. -Ambulated well with physical therapy  REVIEW OF SYSTEMS:  Review of Systems  Constitutional: Negative for chills, fever and malaise/fatigue.  HENT: Negative for congestion, ear discharge, hearing loss and nosebleeds.   Eyes: Negative for blurred vision and double vision.  Respiratory: Positive for shortness of breath. Negative for cough and wheezing.   Cardiovascular: Positive for leg swelling. Negative for chest pain and palpitations.  Gastrointestinal: Negative for abdominal pain, constipation, diarrhea, nausea and vomiting.  Genitourinary: Negative for dysuria.  Musculoskeletal: Negative for myalgias.  Neurological: Negative for dizziness, focal weakness, seizures, weakness and headaches.  Psychiatric/Behavioral: Negative for depression.    DRUG ALLERGIES:   Allergies  Allergen Reactions  . Latex Anaphylaxis and Rash  . Gabapentin Nausea And Vomiting and Other (See Comments)    VITALS:  Blood pressure 118/71, pulse 72, temperature 98 F (36.7 C), temperature source Oral, resp. rate 18, height 5' 5"  (1.651 m), weight 88.3 kg, SpO2 100 %.  PHYSICAL EXAMINATION:  Physical Exam   GENERAL:  62 y.o.-year-old patient lying in the bed with no acute distress.  EYES: Pupils equal, round, reactive to light and accommodation. No scleral icterus. Extraocular muscles intact.  HEENT: Head atraumatic, normocephalic. Oropharynx and nasopharynx clear.  NECK:  Supple, no jugular venous distention. No thyroid enlargement, no tenderness.  LUNGS: Normal breath sounds bilaterally, no wheezing, rales,rhonchi or crepitation. No use of accessory muscles of respiration. Decreased bibasilar breath  sounds CARDIOVASCULAR: S1, S2 normal. No  rubs, or gallops. 2/6 systolic murmur herad ABDOMEN: Soft, nontender, nondistended. Bowel sounds present. No organomegaly or mass.  EXTREMITIES: No  cyanosis, or clubbing. 1+ bilateral pedal edema noted NEUROLOGIC: Cranial nerves II through XII are intact. Muscle strength 5/5 in all extremities. Sensation intact. Gait not checked.  PSYCHIATRIC: The patient is alert and oriented x 3.  SKIN: No obvious rash, lesion, or ulcer.    LABORATORY PANEL:   CBC Recent Labs  Lab 01/14/18 0140  WBC 6.1  HGB 8.6*  HCT 30.4*  PLT 147*   ------------------------------------------------------------------------------------------------------------------  Chemistries  Recent Labs  Lab 01/14/18 0140 01/15/18 0421  NA 138 136  K 4.0 4.2  CL 108 103  CO2 25 25  GLUCOSE 120* 148*  BUN 19 26*  CREATININE 1.33* 1.49*  CALCIUM 8.4* 8.3*  MG 1.8  --    ------------------------------------------------------------------------------------------------------------------  Cardiac Enzymes Recent Labs  Lab 01/14/18 0140  TROPONINI <0.03   ------------------------------------------------------------------------------------------------------------------  RADIOLOGY:  Dg Chest 2 View  Result Date: 01/13/2018 CLINICAL DATA:  Patient reports acute on chronic bilateral LE swelling and SOB worsening over the past 3 days. Denies CP, cough or fever. Hx NSTEMI, DM, COPD, CHF, cervical ca, cholecystectomy, open heart surgery approx. 1 year ago. Current smoker. EXAM: CHEST - 2 VIEW COMPARISON:  09/26/2017 FINDINGS: Patchy airspace opacities in the right middle lobe and both lower lobes, new since previous. Bilateral perihilar interstitial prominence, new since previous. Heart size upper limits normal. Previous CABG, AVR, median sternotomy. New small bilateral pleural effusions. Anterior vertebral endplate spurring at multiple levels in the mid and lower thoracic spine.  IMPRESSION: New bilateral edema/infiltrates and small effusions. Electronically Signed   By: Eden Emms.D.  On: 01/13/2018 12:50   US Venous Img Lower Bilateral  Result Date: 01/13/2018 CLINICAL DATA:  Pain. EXAM: BILATERAL LOWER EXTREMITY VENOUS DOPPLER ULTRASOUND TECHNIQUE: Gray-scale sonography with graded compression, as well as color Doppler and duplex ultrasound were performed to evaluate the lower extremity deep venous systems from the level of the common femoral vein and including the common femoral, femoral, profunda femoral, popliteal and calf veins including the posterior tibial, peroneal and gastrocnemius veins when visible. The superficial great saphenous vein was also interrogated. Spectral Doppler was utilized to evaluate flow at rest and with distal augmentation maneuvers in the common femoral, femoral and popliteal veins. COMPARISON:  None. FINDINGS: RIGHT LOWER EXTREMITY Common Femoral Vein: No evidence of thrombus. Normal compressibility, respiratory phasicity and response to augmentation. Saphenofemoral Junction: No evidence of thrombus. Normal compressibility and flow on color Doppler imaging. Profunda Femoral Vein: No evidence of thrombus. Normal compressibility and flow on color Doppler imaging. Femoral Vein: No evidence of thrombus. Normal compressibility, respiratory phasicity and response to augmentation. Popliteal Vein: No evidence of thrombus. Normal compressibility, respiratory phasicity and response to augmentation. Calf Veins: No evidence of thrombus. Normal compressibility and flow on color Doppler imaging. Superficial Great Saphenous Vein: No evidence of thrombus. Normal compressibility. Venous Reflux:  None. Other Findings:  None. LEFT LOWER EXTREMITY Common Femoral Vein: No evidence of thrombus. Normal compressibility, respiratory phasicity and response to augmentation. Saphenofemoral Junction: No evidence of thrombus. Normal compressibility and flow on color Doppler  imaging. Profunda Femoral Vein: No evidence of thrombus. Normal compressibility and flow on color Doppler imaging. Femoral Vein: No evidence of thrombus. Normal compressibility, respiratory phasicity and response to augmentation. Popliteal Vein: No evidence of thrombus. Normal compressibility, respiratory phasicity and response to augmentation. Calf Veins: Limited evaluation of the LEFT calf veins due to LOWER extremity edema. Superficial Great Saphenous Vein: No evidence of thrombus. Normal compressibility. Venous Reflux:  None. Other Findings:  None. IMPRESSION: No evidence of deep venous thrombosis. Electronically Signed   By: Nolon Nations M.D.   On: 01/13/2018 17:50    EKG:   Orders placed or performed during the hospital encounter of 01/13/18  . ED EKG  . ED EKG  . EKG 12-Lead  . EKG 12-Lead    ASSESSMENT AND PLAN:   62 year old female with past medical history significant for CAD status post CABG, COPD not on home oxygen, diabetes, diastolic CHF presents to hospital secondary to worsening weight gain  1.  Acute diastolic congestive heart failure-known history of moderate to severe mitral regurgitation -Awaiting echocardiogram read -Started on Lasix drip.  BNP decreased to 500 today. -Almost net negative by 3 L.  Change Lasix to IV twice daily today -Weaned off oxygen  2.  Hypertension-on Coreg, IV Lasix and Avapro  3.  CAD-has cardiothoracic surgery scar on her chest.  Follow-up echo as patient not aware of what surgery she had.  Thinks it could be bypass graft surgery.  4.  COPD-stable, inhalers.  5.  Iron deficiency anemia-no indication for transfusion.  Monitor hemoglobin -Continue iron supplements  6.  DVT prophylaxis-Lovenox  Will need home health at discharge.     All the records are reviewed and case discussed with Care Management/Social Workerr. Management plans discussed with the patient, family and they are in agreement.  CODE STATUS: Full Code  TOTAL  TIME TAKING CARE OF THIS PATIENT: 39 minutes.   POSSIBLE D/C IN 1-2DAYS, DEPENDING ON CLINICAL CONDITION.   Gladstone Lighter M.D on 01/15/2018 at 11:29 AM  Between 7am  to 6pm - Pager - [phone]  After 6pm go to [email or web address] - password [email or web address] [facility]  Office  [phone]  CC: Primary care physician; Patient, No Pcp Per

## 2018-01-15 NOTE — Progress Notes (Signed)
Physical Therapy Treatment Patient Details Name: [name] MRN: [id number] DOB: Jul 12, 1955 Today's Date: 01/15/2018    History of Present Illness Patient is a 62 year old female who was admitted with SOB. Patient lives at home with husband. Patient recently had heart surgery and reports that since then things have just been going downhill.     PT Comments    Patient reports she is tired. Agrees to ambulate. O2 not donned when arrived in room. O2 sats at 100%. Patient ambulated 160 feet with rw, required 2 standing rest breaks due to fatigue. Patient reports she has not walked this much in a while. O2 sats monitored throughout session never going below 97% on room air, HR at 84. Patient will continue to benefit from skilled PT to increase activity tolerance and strength for return home.      Follow Up Recommendations  Home health PT     Equipment Recommendations  Rolling walker with 5" wheels    Recommendations for Other Services       Precautions / Restrictions Precautions Precautions: Fall Restrictions Weight Bearing Restrictions: No    Mobility  Bed Mobility               General bed mobility comments: patient received sitting up on side of bed  Transfers Overall transfer level: Modified independent Equipment used: Rolling walker (2 wheeled)             General transfer comment: patient requires increased time and cues for hand placement with transfers  Ambulation/Gait Ambulation/Gait assistance: Modified independent (Device/Increase time);Min guard Gait Distance (Feet): 160 Feet Assistive device: Rolling walker (2 wheeled)   Gait velocity: decreased   General Gait Details: patient required 2 standing rest breaks during ambulation due to fatigue.    Stairs             Wheelchair Mobility    Modified Rankin (Stroke Patients Only)       Balance Overall balance assessment: Modified Independent                                           Cognition Arousal/Alertness: Awake/alert Behavior During Therapy: WFL for tasks assessed/performed Overall Cognitive Status: Within Functional Limits for tasks assessed                                        Exercises      General Comments        Pertinent Vitals/Pain Pain Assessment: No/denies pain    Home Living                      Prior Function            PT Goals (current goals can now be found in the care plan section) Acute Rehab PT Goals Patient Stated Goal: to return home PT Goal Formulation: With patient Time For Goal Achievement: 01/28/18 Potential to Achieve Goals: Good Progress towards PT goals: Progressing toward goals    Frequency    Min 2X/week      PT Plan Current plan remains appropriate    Co-evaluation              AM-PAC PT "6 Clicks" Mobility   Outcome Measure  Help needed turning from your back to  your side while in a flat bed without using bedrails?: A Little Help needed moving from lying on your back to sitting on the side of a flat bed without using bedrails?: A Little Help needed moving to and from a bed to a chair (including a wheelchair)?: A Little Help needed standing up from a chair using your arms (e.g., wheelchair or bedside chair)?: A Little Help needed to walk in hospital room?: A Little Help needed climbing 3-5 steps with a railing? : A Little 6 Click Score: 18    End of Session Equipment Utilized During Treatment: Gait belt Activity Tolerance: Patient tolerated treatment well;Patient limited by fatigue Patient left: with bed alarm set;with call bell/phone within reach;in bed Nurse Communication: Mobility status PT Visit Diagnosis: Muscle weakness (generalized) (M62.81);Other abnormalities of gait and mobility (R26.89)     Time: 4034-7425 PT Time Calculation (min) (ACUTE ONLY): 14 min  Charges:  $Therapeutic Activity: 8-22 mins                     Meredith Kilbride, PT, GCS 01/15/18,11:20 AM

## 2018-01-15 NOTE — Progress Notes (Signed)
*  PRELIMINARY RESULTS* Echocardiogram 2D Echocardiogram has been performed.  [name] 01/15/2018, 8:56 AM

## 2018-01-15 NOTE — Care Management Note (Addendum)
Case Management Note  Patient Details  Name: [name] MRN: [id number] Date of Birth: Jul 23, 1955  Subjective/Objective:    Patient is from home with husband.  Admitted with swelling and SOB; CHF exacerbation.  No oxygen requirements at home; states she would like to have home O2 as she suffers from orthopnea.  Asked Colletta Maryland, RN to attempt to qualify patient for home oxygen.  Current patient is on room air.  IV lasix drip off as of this morning.  She does not have a walker at home.   No current dervices in the home.  PT is recommending home health PT; she would also benefit from skilled nursing with heart failure protocol.  She is willing to have home health services at discharge.  List of agencies and ratings provided.   She does not have a functioning scale.  Explained importance of weighing daily and low sodium diet.  She uses [facility] and they deliver her medications.  Denies difficulties obtaining medications or with accessing medical care.  She is current with her PCP.  She does not drive.  She states she has a Social worker that transports her to appointments.                Action/Plan: Referral made to [facility] for rolling walker and [facility] for home health services.  Scale provided by [facility]. Heart failure appointment made.  Glucose meter given to patient as she does not have one.     Expected Discharge Date:                  Expected Discharge Plan:  [place]  In-House Referral:     Discharge planning Services  CM Consult  Post Acute Care Choice:  Home Health, Durable Medical Equipment Choice offered to:  Patient  DME Arranged:  Walker rolling DME Agency:  [place] Arranged:  RN, PT [facility] Agency:  [place]  Status of Service:  In process, will continue to follow  If discussed at Long Length of Stay Meetings, dates discussed:    Additional Comments:  Elza Rafter, RN 01/15/2018, 9:55 AM

## 2018-01-16 LAB — BASIC METABOLIC PANEL
Anion gap: 7 (ref 5–15)
BUN: 39 mg/dL — ABNORMAL HIGH (ref 8–23)
CO2: 26 mmol/L (ref 22–32)
CREATININE: 1.83 mg/dL — AB (ref 0.44–1.00)
Calcium: 8.2 mg/dL — ABNORMAL LOW (ref 8.9–10.3)
Chloride: 101 mmol/L (ref 98–111)
GFR calc Af Amer: 33 mL/min — ABNORMAL LOW (ref >60)
GFR calc non Af Amer: 29 mL/min — ABNORMAL LOW (ref >60)
Glucose, Bld: 120 mg/dL — ABNORMAL HIGH (ref 70–99)
Potassium: 5.2 mmol/L — ABNORMAL HIGH (ref 3.5–5.1)
SODIUM: 134 mmol/L — AB (ref 135–145)

## 2018-01-16 LAB — GLUCOSE, CAPILLARY
GLUCOSE-CAPILLARY: 153 mg/dL — AB (ref 70–99)
Glucose-Capillary: 127 mg/dL — ABNORMAL HIGH (ref 70–99)

## 2018-01-16 MED ORDER — ATORVASTATIN CALCIUM 40 MG PO TABS: 40.0000 mg | ORAL_TABLET | Freq: Every day | ORAL | 3 refills | Status: DC

## 2018-01-16 MED ORDER — POTASSIUM CHLORIDE ER 10 MEQ PO TBCR: 10.0000 meq | EXTENDED_RELEASE_TABLET | Freq: Every day | ORAL | 2 refills | Status: DC

## 2018-01-16 MED ORDER — OXYCODONE-ACETAMINOPHEN 5-325 MG PO TABS
1.0000 | ORAL_TABLET | Freq: Once | ORAL | Status: AC
  Administered 2018-01-16: 1 via ORAL
  Filled 2018-01-16: qty 1

## 2018-01-16 MED ORDER — FUROSEMIDE 40 MG PO TABS: 40.0000 mg | ORAL_TABLET | Freq: Two times a day (BID) | ORAL | 11 refills | Status: DC

## 2018-01-16 MED ORDER — FUROSEMIDE 40 MG PO TABS: 40.0000 mg | ORAL_TABLET | Freq: Every day | ORAL | 11 refills | Status: DC

## 2018-01-16 MED ORDER — FUROSEMIDE 40 MG PO TABS
40.0000 mg | ORAL_TABLET | Freq: Two times a day (BID) | ORAL | Status: DC
  Administered 2018-01-16: 40 mg via ORAL
  Filled 2018-01-16: qty 1

## 2018-01-16 MED ORDER — PREGABALIN 100 MG PO CAPS: 100.0000 mg | ORAL_CAPSULE | ORAL | 1 refills | Status: DC

## 2018-01-16 NOTE — Progress Notes (Signed)
Patient is being discharged home with home health, all discharge instruction provided, iv removed tele removed, assisted patient to waiting area per her request so she can wait for her ride .

## 2018-01-16 NOTE — Care Management (Signed)
Patient discharging with home health today.  Helene Kelp with [facility] is aware.

## 2018-01-16 NOTE — Discharge Summary (Signed)
[facility] [place] NAME: [name]    MR#:  [id number]  DATE OF BIRTH:  [birth year]  DATE OF ADMISSION:  01/13/2018   ADMITTING PHYSICIAN: Loletha Grayer, MD  DATE OF DISCHARGE: 01/16/18  PRIMARY CARE PHYSICIAN: Patient, No Pcp Per   ADMISSION DIAGNOSIS:   Peripheral edema [R60.9] Dyspnea on exertion [R06.09] Pain [R52] Acute on chronic congestive heart failure, unspecified heart failure type ([place]) [I50.9]  DISCHARGE DIAGNOSIS:   Active Problems:   CHF (congestive heart failure) ([place])   SECONDARY DIAGNOSIS:   Past Medical History:  Diagnosis Date  . Cervical cancer ([place])   . CHF (congestive heart failure) ([place])   . COPD (chronic obstructive pulmonary disease) ([place])   . Diabetes mellitus without complication ([place])   . 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  Acute diastolic congestive heart failure-known history of moderate to severe mitral regurgitation -Echocardiogram with moderate LVH and EF at 40 to 45% and diffuse hypokinesis noted.  Patient has had bioprosthesis for aortic valve -so has some systolic dysfunction as well. -Not starting on ACE inhibitor or ARB due to low blood pressure at this time -Received IV Lasix drip in the hospital.  Being discharged on oral Lasix.  Well diuresed.  Significant improvement in her pedal edema. -Mild renal insufficiency due to overdiuresis.  Lasix dose decreased to daily -Outpatient follow-up basic metabolic panel -Sodium restriction recommended, daily weights and follow-up with the CHF clinic as well  2.  Hypertension-on Coreg, and Lasix.  Blood pressures on the low normal side  3.  CAD-has  cardiothoracic surgery scar on her chest.  Echo showing a bioprosthetic aortic valve, so could have had aortic valve surgery.  Does not have a cardiologist locally and will follow-up with [facility] clinic cardiology here  4.  COPD-stable, inhalers.  5.  Iron deficiency anemia-no indication for transfusion.  Monitor hemoglobin -Continue iron supplements    Discharge home with home health today    DISCHARGE CONDITIONS:   Guarded  CONSULTS OBTAINED:   Treatment Team:  Yolonda Kida, MD  DRUG ALLERGIES:   Allergies  Allergen Reactions  . Latex Anaphylaxis and Rash  . Gabapentin Nausea And Vomiting and Other (See Comments)   DISCHARGE MEDICATIONS:   Allergies as of 01/16/2018      Reactions   Latex Anaphylaxis, Rash   Gabapentin Nausea And Vomiting, Other (See Comments)      Medication List    STOP taking these medications   blood glucose meter kit and supplies Kit   Insulin Pen Needle 32G X 5 MM Misc     TAKE these medications   atorvastatin 40 MG tablet Commonly known as:  LIPITOR Take 1 tablet (40 mg total) by mouth daily.   carvedilol 3.125 MG tablet Commonly known as:  COREG Take 1 tablet (3.125 mg total) by mouth 2 (two) times daily with a meal.   ferrous sulfate 325 (65 FE) MG tablet Take 1 tablet (325 mg total) by mouth 2 (two) times daily with a meal.   folic acid 1 MG tablet Commonly known as:  FOLVITE Take 1 tablet (1 mg total) by mouth daily.   furosemide 40 MG tablet Commonly known as:  LASIX  Take 1 tablet (40 mg total) by mouth daily. What changed:  when to take this   Insulin Glargine 100 UNIT/ML Solostar Pen Commonly known as:  LANTUS SOLOSTAR Inject 15 Units into the skin daily. What changed:  how much to take   omeprazole 40 MG capsule Commonly known as:  PRILOSEC Take 1 capsule (40 mg total) by mouth daily.   ondansetron 4 MG tablet Commonly known as:  ZOFRAN Take 1 tablet (4 mg total) by mouth every 6 (six) hours as  needed for nausea.   pregabalin 100 MG capsule Commonly known as:  LYRICA Take 100-200 mg by mouth See admin instructions. Take 1 capsule (100MG) by mouth every morning and 2 capsules (200MG) by mouth every night   PROAIR HFA 108 (90 Base) MCG/ACT inhaler Generic drug:  albuterol Inhale 1-2 puffs into the lungs every 4 (four) hours as needed.   SYMBICORT 160-4.5 MCG/ACT inhaler Generic drug:  budesonide-formoterol Inhale 2 puffs into the lungs 2 (two) times daily.        DISCHARGE INSTRUCTIONS:   1. cardiology follow-up in 1 to 2 weeks 2.  CHF clinic follow-up  DIET:   Cardiac diet  ACTIVITY:   Activity as tolerated  OXYGEN:   Home Oxygen: No.  Oxygen Delivery: room air  DISCHARGE LOCATION:   home   If you experience worsening of your admission symptoms, develop shortness of breath, life threatening emergency, suicidal or homicidal thoughts you must seek medical attention immediately by calling 911 or calling your MD immediately  if symptoms less severe.  You Must read complete instructions/literature along with all the possible adverse reactions/side effects for all the Medicines you take and that have been prescribed to you. Take any new Medicines after you have completely understood and accpet all the possible adverse reactions/side effects.   Please note  You were cared for by a hospitalist during your hospital stay. If you have any questions about your discharge medications or the care you received while you were in the hospital after you are discharged, you can call the unit and asked to speak with the hospitalist on call if the hospitalist that took care of you is not available. Once you are discharged, your primary care physician will handle any further medical issues. Please note that NO REFILLS for any discharge medications will be authorized once you are discharged, as it is imperative that you return to your primary care physician (or establish a relationship  with a primary care physician if you do not have one) for your aftercare needs so that they can reassess your need for medications and monitor your lab values.    On the day of Discharge:  VITAL SIGNS:   Blood pressure (!) 95/59, pulse 72, temperature 98.2 F (36.8 C), temperature source Oral, resp. rate 18, height 5' 5"  (1.651 m), weight 88.9 kg, SpO2 94 %.  PHYSICAL EXAMINATION:    GENERAL:  62 y.o.-year-old patient lying in the bed with no acute distress.  EYES: Pupils equal, round, reactive to light and accommodation. No scleral icterus. Extraocular muscles intact.  HEENT: Head atraumatic, normocephalic. Oropharynx and nasopharynx clear.  NECK:  Supple, no jugular venous distention. No thyroid enlargement, no tenderness.  LUNGS: Normal breath sounds bilaterally, no wheezing, rales,rhonchi or crepitation. No use of accessory muscles of respiration. Decreased bibasilar breath sounds CARDIOVASCULAR: S1, S2 normal. No  rubs, or gallops. 2/6 systolic murmur herad ABDOMEN: Soft, nontender, nondistended. Bowel sounds present. No organomegaly or mass.  EXTREMITIES: No  cyanosis, or clubbing. 1+ bilateral pedal edema noted NEUROLOGIC: Cranial nerves II through XII are intact. Muscle strength 5/5 in all extremities. Sensation intact. Gait not checked.  PSYCHIATRIC: The patient is alert and oriented x 3.  SKIN: No obvious rash, lesion, or ulcer.   DATA REVIEW:   CBC Recent Labs  Lab 01/14/18 0140  WBC 6.1  HGB 8.6*  HCT 30.4*  PLT 147*    Chemistries  Recent Labs  Lab 01/14/18 0140  01/16/18 0526  NA 138   < > 134*  K 4.0   < > 5.2*  CL 108   < > 101  CO2 25   < > 26  GLUCOSE 120*   < > 120*  BUN 19   < > 39*  CREATININE 1.33*   < > 1.83*  CALCIUM 8.4*   < > 8.2*  MG 1.8  --   --    < > = values in this interval not displayed.     Microbiology Results  Results for orders placed or performed during the hospital encounter of 09/26/17  MRSA PCR Screening     Status:  None   Collection Time: 09/27/17 12:07 AM  Result Value Ref Range Status   MRSA by PCR NEGATIVE NEGATIVE Final    Comment:        The GeneXpert MRSA Assay (FDA approved for NASAL specimens only), is one component of a comprehensive MRSA colonization surveillance program. It is not intended to diagnose MRSA infection nor to guide or monitor treatment for MRSA infections. Performed at [facility], [address] [place], [place] [address]     RADIOLOGY:  No results found.   Management plans discussed with the patient, family and they are in agreement.  CODE STATUS:     Code Status Orders  (From admission, onward)         Start     Ordered   01/13/18 1538  Full code  Continuous     01/13/18 1539        Code Status History    Date Active Date Inactive Code Status Order ID Comments User Context   11/03/2017 1758 11/06/2017 2136 Full Code [id number]  [name], MD Inpatient   09/26/2017 2353 09/30/2017 1404 Full Code [id number]  Lance Coon, MD Inpatient   08/27/2017 0347 08/29/2017 2054 Full Code [id number]  Harrie Foreman, MD Inpatient   08/07/2017 1704 08/09/2017 2237 Full Code [id number]  Saundra Shelling, MD Inpatient   12/18/2016 1146 12/26/2016 2032 Full Code [id number]  Vaughan Basta, MD Inpatient   12/18/2016 0959 12/18/2016 1146 Full Code [id number]  Vaughan Basta, MD ED   12/04/2015 0509 12/07/2015 1756 Full Code [id number]  Holley Raring, NP ED   10/28/2015 1640 10/29/2015 1711 Full Code [id number]  Bettey Costa, MD Inpatient   08/29/2015 1438 08/30/2015 1750 Full Code [id number]  Hugelmeyer, Ubaldo Glassing, DO Inpatient      TOTAL TIME TAKING CARE OF THIS PATIENT: 38 minutes.    Gladstone Lighter M.D on 01/16/2018 at 10:20 AM  Between 7am to 6pm - Pager - [phone]  After 6pm go to [email or web address] - Proofreader  [facility]  Office  [phone]  CC: Primary care physician; Patient, No Pcp  Per   Note: This dictation was prepared with Dragon dictation along with smaller phrase technology. Any transcriptional errors that result from this process are unintentional.

## 2018-01-16 NOTE — Plan of Care (Signed)
  Problem: Education: Goal: Knowledge of General Education information will improve Description: Including pain rating scale, medication(s)/side effects and non-pharmacologic comfort measures Outcome: Progressing   Problem: Clinical Measurements: Goal: Will remain free from infection Outcome: Progressing   

## 2018-01-16 NOTE — Progress Notes (Signed)
PT Cancellation Note  Patient Details Name: ELLIETT GUARISCO MRN: [id number] DOB: Dec 31, 1955   Cancelled Treatment:    Reason Eval/Treat Not Completed: Patient declined, no reason specified;Other (comment)(patient reports she is not feeling well. ) Attempted to see patient again this afternoon and patient ready for discharge.     Sundeep Destin 01/16/2018, 2:12 PM

## 2018-01-18 ENCOUNTER — Inpatient Hospital Stay: Payer: Medicaid Other | Attending: Hematology and Oncology

## 2018-01-19 ENCOUNTER — Telehealth: Payer: Self-pay

## 2018-01-19 NOTE — Telephone Encounter (Signed)
Flagged on EMMI report for not receiving discharge papers, not having a follow up scheduled, and having unfilled prescriptions. Called and spoke with patient.  She expressed frustration with her stay at [facility], stating she felt worse now than when she came.  Stated she was given nothing when she left and that she was up all night in tears due to pain. She stated she had called the unit she discharged from, however had not received any help, just that someone would call her.  I apologized that she had had that experience. Per AVS, furosemide and atorvastatin written for and sent to [facility].  She confirmed the pharmacy has received them, though has not picked up.  She requested needing something for pain and that we did not address this while here.  I encouraged her to contact her PCP to assist as the hospitalist would not be able to write for anything once she left. She stated she would no longer come to [facility] for services and hung up before I could complete call.  Will place a ticket with EMMI to remove patient from callback services.

## 2018-01-23 ENCOUNTER — Ambulatory Visit: Payer: Medicaid Other | Admitting: Family

## 2018-01-25 ENCOUNTER — Ambulatory Visit: Payer: Medicaid Other | Admitting: Family

## 2018-01-25 ENCOUNTER — Telehealth: Payer: Self-pay | Admitting: Family

## 2018-01-25 NOTE — Telephone Encounter (Signed)
Patient did not show for her [facility] appointment on 01/25/2018. Will attempt to reschedule.

## 2018-02-05 DIAGNOSIS — F329 Major depressive disorder, single episode, unspecified: Secondary | ICD-10-CM | POA: Insufficient documentation

## 2018-02-05 DIAGNOSIS — F32A Depression, unspecified: Secondary | ICD-10-CM | POA: Insufficient documentation

## 2018-02-05 DIAGNOSIS — R1084 Generalized abdominal pain: Secondary | ICD-10-CM | POA: Insufficient documentation

## 2018-02-08 DIAGNOSIS — I5033 Acute on chronic diastolic (congestive) heart failure: Secondary | ICD-10-CM | POA: Insufficient documentation

## 2018-02-08 DIAGNOSIS — I425 Other restrictive cardiomyopathy: Secondary | ICD-10-CM | POA: Insufficient documentation

## 2018-02-18 DIAGNOSIS — I071 Rheumatic tricuspid insufficiency: Secondary | ICD-10-CM | POA: Insufficient documentation

## 2018-02-19 ENCOUNTER — Inpatient Hospital Stay: Payer: Medicaid Other | Attending: Hematology and Oncology

## 2018-02-23 DIAGNOSIS — E1142 Type 2 diabetes mellitus with diabetic polyneuropathy: Secondary | ICD-10-CM | POA: Insufficient documentation

## 2018-02-28 ENCOUNTER — Ambulatory Visit: Payer: Medicaid Other | Admitting: Gastroenterology

## 2018-02-28 ENCOUNTER — Encounter: Payer: Self-pay | Admitting: Gastroenterology

## 2018-03-30 IMAGING — US US ABDOMEN LIMITED
1 series · 14 of 25 positions shown · non-contrast
Comparison: CT scan of March 12, 2012.

CLINICAL DATA: Thrombocytopenia.

EXAM:
ULTRASOUND ABDOMEN LIMITED RIGHT UPPER QUADRANT

[Series 1: us abdomen limited · 0.30mm/px · 14 of 54 slices shown]
[im 1/54]
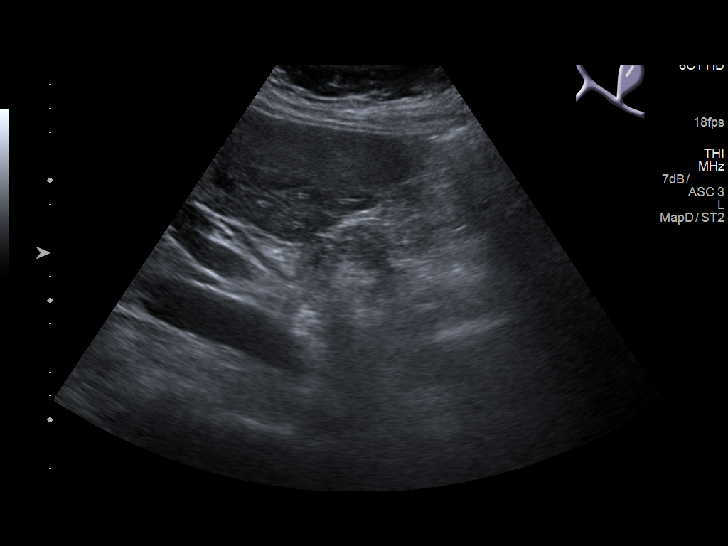
[im 5/54]
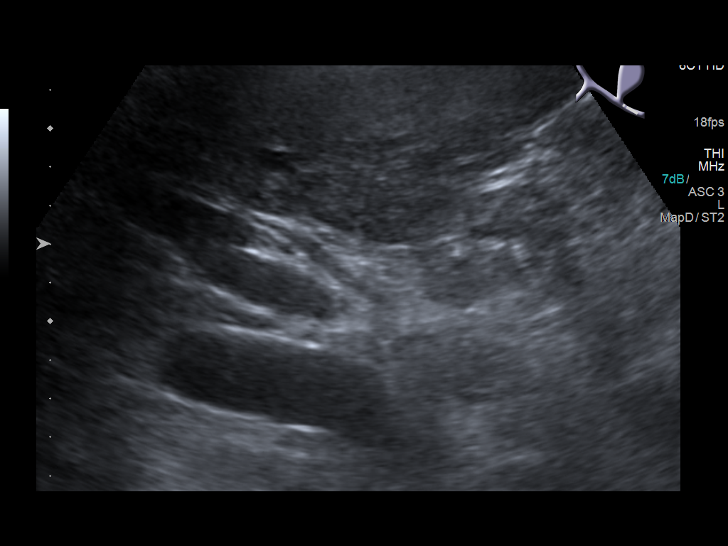
[im 9/54]
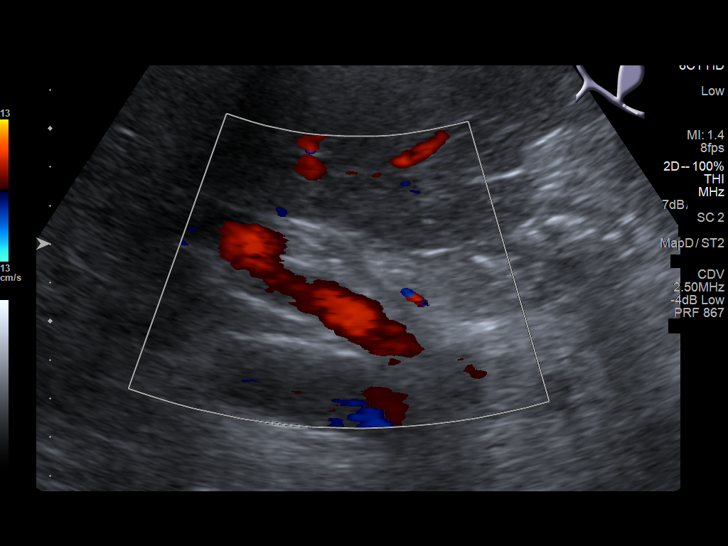
[im 14/54]
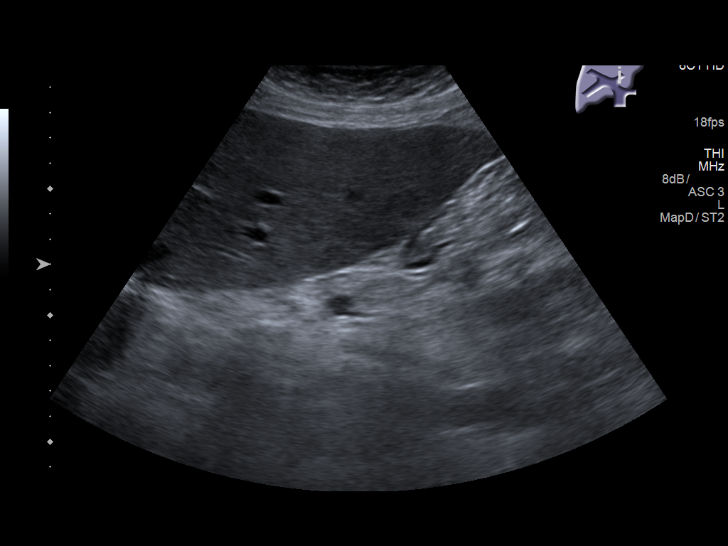
[im 18/54]
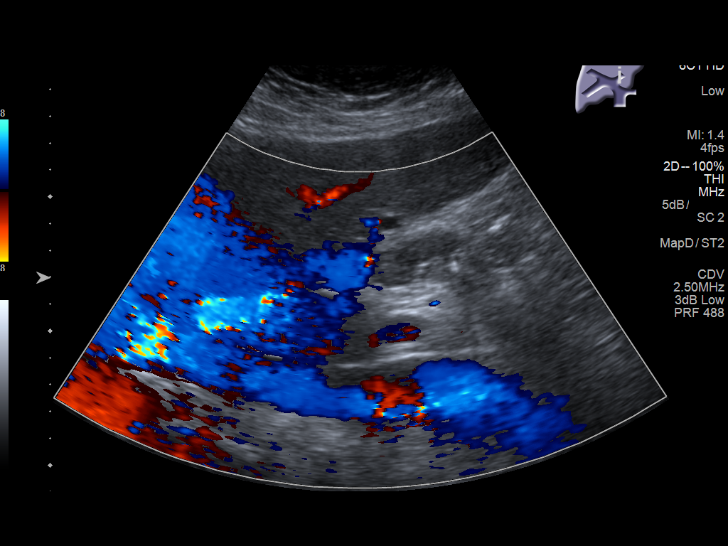
[im 20/54]
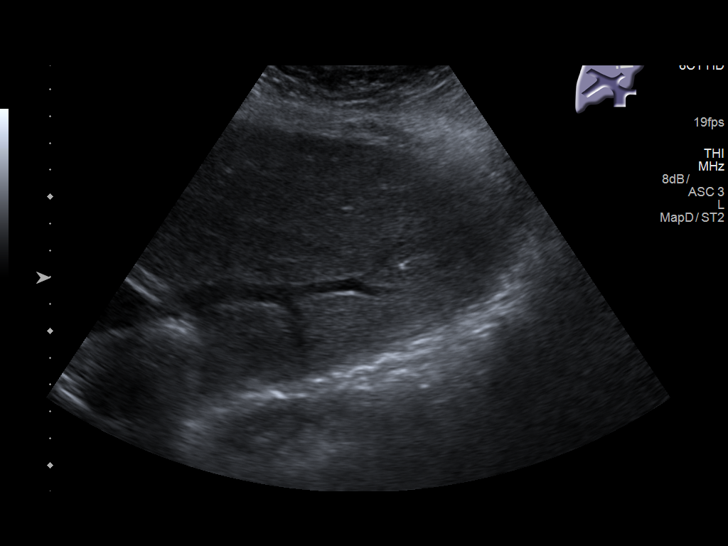
[im 25/54]
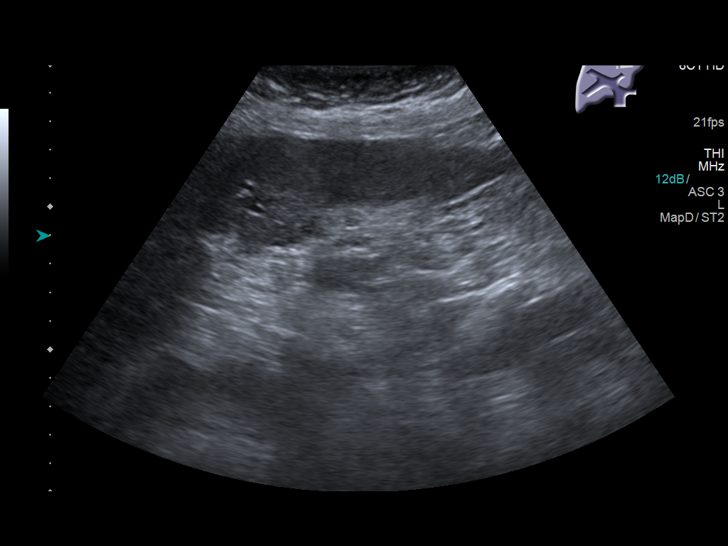
[im 29/54]
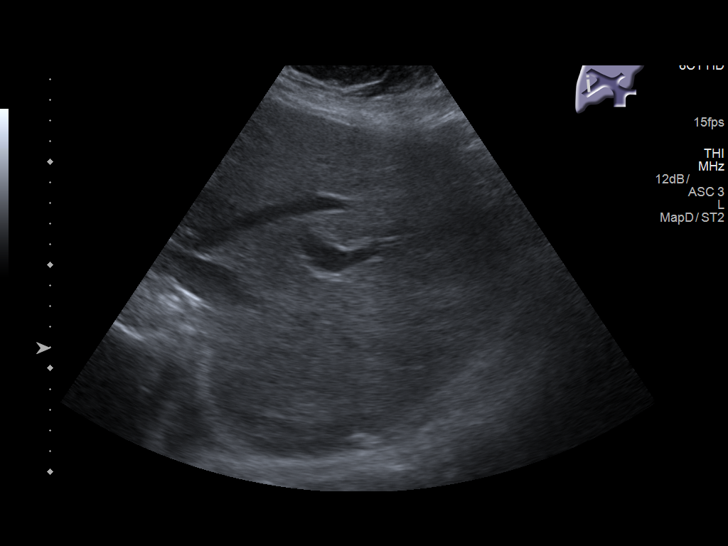
[im 34/54]
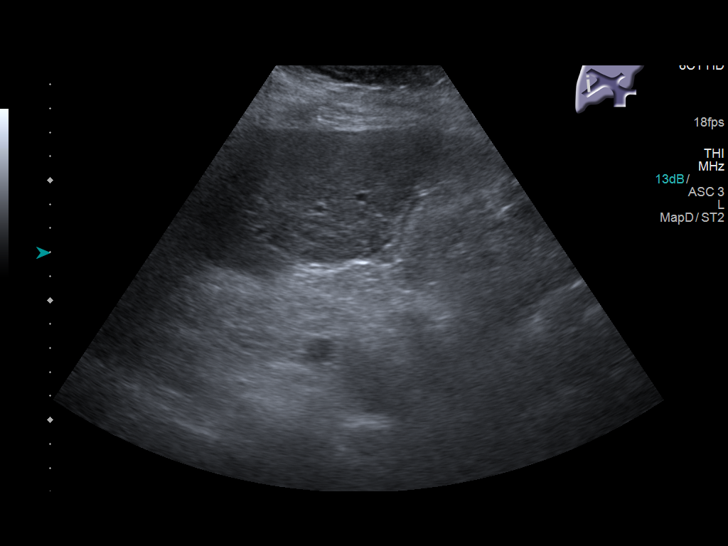
[im 36/54]
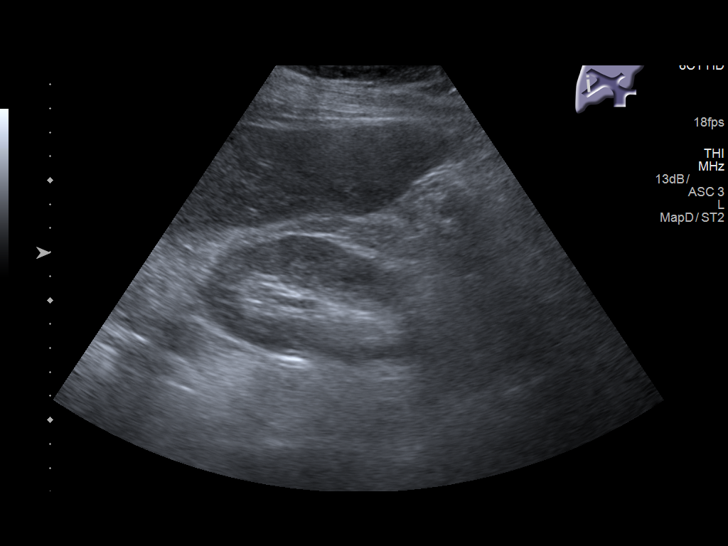
[im 40/54]
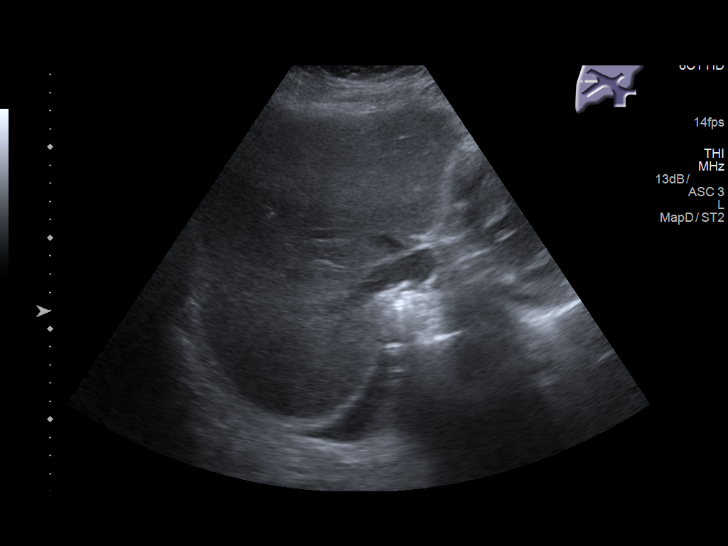
[im 45/54]
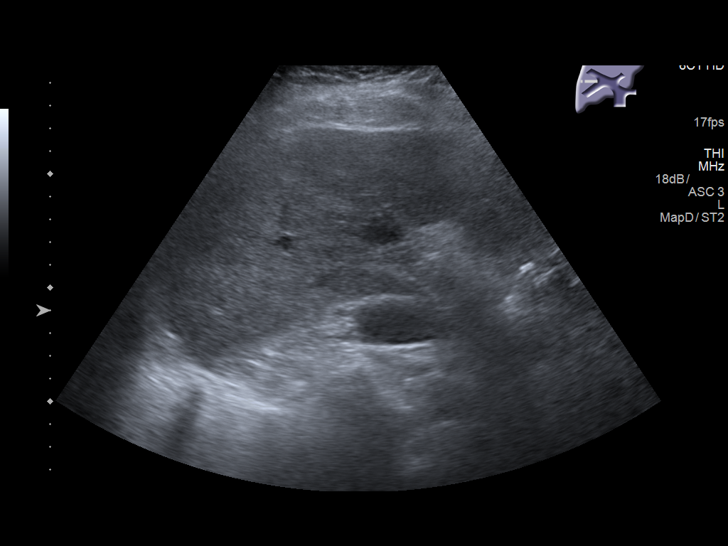
[im 49/54]
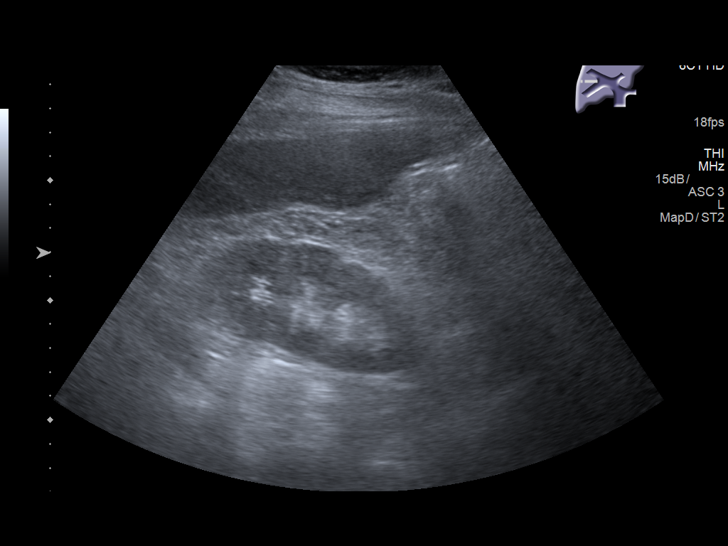
[im 54/54]
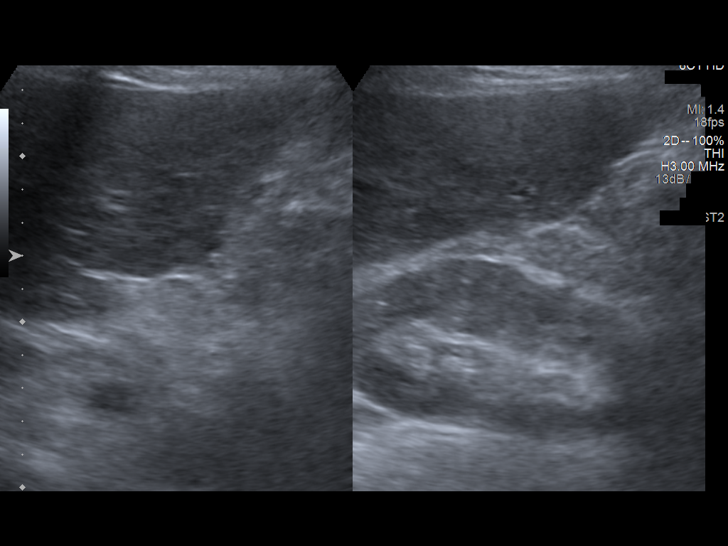

[14 of 25 positions shown; findings below may reference images not displayed]

FINDINGS: Gallbladder:

Status post cholecystectomy.

Common bile duct:

Diameter: 4 mm which is within normal limits.

Liver:

Increased echogenicity of hepatic parenchyma is noted with mildly
nodular hepatic contours suggesting cirrhosis. No focal abnormality
is noted. Portal vein is patent on color Doppler imaging with normal
direction of blood flow towards the liver.
IMPRESSION: Status post cholecystectomy.

Increased echogenicity of hepatic parenchyma is noted with slightly
nodular hepatic margins suggesting possible hepatic cirrhosis.

## 2018-06-20 ENCOUNTER — Inpatient Hospital Stay
Admission: EM | Admit: 2018-06-20 | Discharge: 2018-06-27 | DRG: 356 | Disposition: A | Discharge status: Home Health | Payer: Medicaid Other | Attending: Internal Medicine | Admitting: Internal Medicine

## 2018-06-20 ENCOUNTER — Encounter: Payer: Self-pay | Admitting: Emergency Medicine

## 2018-06-20 ENCOUNTER — Other Ambulatory Visit: Payer: Self-pay

## 2018-06-20 DIAGNOSIS — K254 Chronic or unspecified gastric ulcer with hemorrhage: Secondary | ICD-10-CM | POA: Diagnosis present

## 2018-06-20 DIAGNOSIS — E785 Hyperlipidemia, unspecified: Secondary | ICD-10-CM | POA: Diagnosis present

## 2018-06-20 DIAGNOSIS — Z888 Allergy status to other drugs, medicaments and biological substances status: Secondary | ICD-10-CM

## 2018-06-20 DIAGNOSIS — M84675A Pathological fracture in other disease, left foot, initial encounter for fracture: Secondary | ICD-10-CM | POA: Diagnosis present

## 2018-06-20 DIAGNOSIS — B182 Chronic viral hepatitis C: Secondary | ICD-10-CM | POA: Diagnosis present

## 2018-06-20 DIAGNOSIS — E1122 Type 2 diabetes mellitus with diabetic chronic kidney disease: Secondary | ICD-10-CM | POA: Diagnosis present

## 2018-06-20 DIAGNOSIS — E1142 Type 2 diabetes mellitus with diabetic polyneuropathy: Secondary | ICD-10-CM | POA: Diagnosis present

## 2018-06-20 DIAGNOSIS — G4700 Insomnia, unspecified: Secondary | ICD-10-CM | POA: Diagnosis present

## 2018-06-20 DIAGNOSIS — E1152 Type 2 diabetes mellitus with diabetic peripheral angiopathy with gangrene: Secondary | ICD-10-CM | POA: Diagnosis present

## 2018-06-20 DIAGNOSIS — J449 Chronic obstructive pulmonary disease, unspecified: Secondary | ICD-10-CM | POA: Diagnosis present

## 2018-06-20 DIAGNOSIS — K219 Gastro-esophageal reflux disease without esophagitis: Secondary | ICD-10-CM | POA: Diagnosis present

## 2018-06-20 DIAGNOSIS — N183 Chronic kidney disease, stage 3 (moderate): Secondary | ICD-10-CM | POA: Diagnosis present

## 2018-06-20 DIAGNOSIS — I2510 Atherosclerotic heart disease of native coronary artery without angina pectoris: Secondary | ICD-10-CM | POA: Diagnosis present

## 2018-06-20 DIAGNOSIS — Z1159 Encounter for screening for other viral diseases: Secondary | ICD-10-CM

## 2018-06-20 DIAGNOSIS — I255 Ischemic cardiomyopathy: Secondary | ICD-10-CM | POA: Diagnosis present

## 2018-06-20 DIAGNOSIS — Q2733 Arteriovenous malformation of digestive system vessel: Secondary | ICD-10-CM | POA: Diagnosis not present

## 2018-06-20 DIAGNOSIS — D649 Anemia, unspecified: Secondary | ICD-10-CM

## 2018-06-20 DIAGNOSIS — G8929 Other chronic pain: Secondary | ICD-10-CM | POA: Diagnosis present

## 2018-06-20 DIAGNOSIS — I96 Gangrene, not elsewhere classified: Secondary | ICD-10-CM | POA: Diagnosis not present

## 2018-06-20 DIAGNOSIS — I425 Other restrictive cardiomyopathy: Secondary | ICD-10-CM | POA: Diagnosis present

## 2018-06-20 DIAGNOSIS — E11621 Type 2 diabetes mellitus with foot ulcer: Secondary | ICD-10-CM | POA: Diagnosis present

## 2018-06-20 DIAGNOSIS — Z8249 Family history of ischemic heart disease and other diseases of the circulatory system: Secondary | ICD-10-CM

## 2018-06-20 DIAGNOSIS — L97529 Non-pressure chronic ulcer of other part of left foot with unspecified severity: Secondary | ICD-10-CM | POA: Diagnosis present

## 2018-06-20 DIAGNOSIS — F17210 Nicotine dependence, cigarettes, uncomplicated: Secondary | ICD-10-CM | POA: Diagnosis present

## 2018-06-20 DIAGNOSIS — K922 Gastrointestinal hemorrhage, unspecified: Secondary | ICD-10-CM | POA: Diagnosis not present

## 2018-06-20 DIAGNOSIS — I130 Hypertensive heart and chronic kidney disease with heart failure and stage 1 through stage 4 chronic kidney disease, or unspecified chronic kidney disease: Secondary | ICD-10-CM | POA: Diagnosis present

## 2018-06-20 DIAGNOSIS — Z79899 Other long term (current) drug therapy: Secondary | ICD-10-CM

## 2018-06-20 DIAGNOSIS — K5520 Angiodysplasia of colon without hemorrhage: Secondary | ICD-10-CM | POA: Diagnosis not present

## 2018-06-20 DIAGNOSIS — Z7951 Long term (current) use of inhaled steroids: Secondary | ICD-10-CM

## 2018-06-20 DIAGNOSIS — E1169 Type 2 diabetes mellitus with other specified complication: Secondary | ICD-10-CM | POA: Diagnosis present

## 2018-06-20 DIAGNOSIS — M869 Osteomyelitis, unspecified: Secondary | ICD-10-CM | POA: Diagnosis present

## 2018-06-20 DIAGNOSIS — I252 Old myocardial infarction: Secondary | ICD-10-CM | POA: Diagnosis not present

## 2018-06-20 DIAGNOSIS — Z794 Long term (current) use of insulin: Secondary | ICD-10-CM

## 2018-06-20 DIAGNOSIS — Z833 Family history of diabetes mellitus: Secondary | ICD-10-CM

## 2018-06-20 DIAGNOSIS — D500 Iron deficiency anemia secondary to blood loss (chronic): Secondary | ICD-10-CM | POA: Diagnosis present

## 2018-06-20 DIAGNOSIS — R69 Illness, unspecified: Secondary | ICD-10-CM | POA: Diagnosis present

## 2018-06-20 DIAGNOSIS — I110 Hypertensive heart disease with heart failure: Secondary | ICD-10-CM | POA: Diagnosis not present

## 2018-06-20 DIAGNOSIS — E1165 Type 2 diabetes mellitus with hyperglycemia: Secondary | ICD-10-CM | POA: Diagnosis present

## 2018-06-20 DIAGNOSIS — Z953 Presence of xenogenic heart valve: Secondary | ICD-10-CM

## 2018-06-20 DIAGNOSIS — I70262 Atherosclerosis of native arteries of extremities with gangrene, left leg: Secondary | ICD-10-CM | POA: Diagnosis not present

## 2018-06-20 DIAGNOSIS — Z9049 Acquired absence of other specified parts of digestive tract: Secondary | ICD-10-CM

## 2018-06-20 DIAGNOSIS — D638 Anemia in other chronic diseases classified elsewhere: Secondary | ICD-10-CM | POA: Diagnosis present

## 2018-06-20 DIAGNOSIS — Z803 Family history of malignant neoplasm of breast: Secondary | ICD-10-CM

## 2018-06-20 DIAGNOSIS — Z95820 Peripheral vascular angioplasty status with implants and grafts: Secondary | ICD-10-CM | POA: Diagnosis not present

## 2018-06-20 DIAGNOSIS — F419 Anxiety disorder, unspecified: Secondary | ICD-10-CM | POA: Diagnosis present

## 2018-06-20 DIAGNOSIS — I5043 Acute on chronic combined systolic (congestive) and diastolic (congestive) heart failure: Secondary | ICD-10-CM | POA: Diagnosis present

## 2018-06-20 DIAGNOSIS — I739 Peripheral vascular disease, unspecified: Secondary | ICD-10-CM | POA: Diagnosis not present

## 2018-06-20 DIAGNOSIS — Z91040 Latex allergy status: Secondary | ICD-10-CM

## 2018-06-20 DIAGNOSIS — Z8541 Personal history of malignant neoplasm of cervix uteri: Secondary | ICD-10-CM

## 2018-06-20 DIAGNOSIS — E119 Type 2 diabetes mellitus without complications: Secondary | ICD-10-CM | POA: Diagnosis not present

## 2018-06-20 DIAGNOSIS — Z951 Presence of aortocoronary bypass graft: Secondary | ICD-10-CM

## 2018-06-20 DIAGNOSIS — I509 Heart failure, unspecified: Secondary | ICD-10-CM | POA: Diagnosis not present

## 2018-06-20 LAB — CBC WITH DIFFERENTIAL/PLATELET
Abs Immature Granulocytes: 0.04 10*3/uL (ref 0.00–0.07)
Basophils Absolute: 0 10*3/uL (ref 0.0–0.1)
Basophils Relative: 0 %
Eosinophils Absolute: 0.1 10*3/uL (ref 0.0–0.5)
Eosinophils Relative: 2 %
HCT: 14.4 % — CL (ref 36.0–46.0)
Hemoglobin: 3.8 g/dL — CL (ref 12.0–15.0)
Immature Granulocytes: 1 %
Lymphocytes Relative: 22 %
Lymphs Abs: 1.7 10*3/uL (ref 0.7–4.0)
MCH: 19.4 pg — ABNORMAL LOW (ref 26.0–34.0)
MCHC: 26.4 g/dL — ABNORMAL LOW (ref 30.0–36.0)
MCV: 73.5 fL — ABNORMAL LOW (ref 80.0–100.0)
Monocytes Absolute: 0.9 10*3/uL (ref 0.1–1.0)
Monocytes Relative: 11 %
Neutro Abs: 5 10*3/uL (ref 1.7–7.7)
Neutrophils Relative %: 64 %
Platelets: 179 10*3/uL (ref 150–400)
RBC: 1.96 MIL/uL — ABNORMAL LOW (ref 3.87–5.11)
RDW: 20.8 % — ABNORMAL HIGH (ref 11.5–15.5)
Smear Review: NORMAL
WBC: 7.8 10*3/uL (ref 4.0–10.5)
nRBC: 0.8 % — ABNORMAL HIGH (ref 0.0–0.2)

## 2018-06-20 LAB — IRON AND TIBC
Iron: 10 ug/dL — ABNORMAL LOW (ref 28–170)
Saturation Ratios: 3 % — ABNORMAL LOW (ref 10.4–31.8)
TIBC: 336 ug/dL (ref 250–450)
UIBC: 326 ug/dL

## 2018-06-20 LAB — LACTIC ACID, PLASMA
Lactic Acid, Venous: 2.1 mmol/L (ref 0.5–1.9)
Lactic Acid, Venous: 2.2 mmol/L (ref 0.5–1.9)

## 2018-06-20 LAB — COMPREHENSIVE METABOLIC PANEL
ALT: 8 U/L (ref 0–44)
AST: 12 U/L — ABNORMAL LOW (ref 15–41)
Albumin: 3 g/dL — ABNORMAL LOW (ref 3.5–5.0)
Alkaline Phosphatase: 94 U/L (ref 38–126)
Anion gap: 8 (ref 5–15)
BUN: 26 mg/dL — ABNORMAL HIGH (ref 8–23)
CO2: 17 mmol/L — ABNORMAL LOW (ref 22–32)
Calcium: 8.3 mg/dL — ABNORMAL LOW (ref 8.9–10.3)
Chloride: 110 mmol/L (ref 98–111)
Creatinine, Ser: 1.85 mg/dL — ABNORMAL HIGH (ref 0.44–1.00)
GFR calc Af Amer: 33 mL/min — ABNORMAL LOW (ref >60)
GFR calc non Af Amer: 28 mL/min — ABNORMAL LOW (ref >60)
Glucose, Bld: 166 mg/dL — ABNORMAL HIGH (ref 70–99)
Potassium: 4 mmol/L (ref 3.5–5.1)
Sodium: 135 mmol/L (ref 135–145)
Total Bilirubin: 0.4 mg/dL (ref 0.3–1.2)
Total Protein: 7.8 g/dL (ref 6.5–8.1)

## 2018-06-20 LAB — GLUCOSE, CAPILLARY: Glucose-Capillary: 148 mg/dL — ABNORMAL HIGH (ref 70–99)

## 2018-06-20 LAB — SARS CORONAVIRUS 2 BY RT PCR (HOSPITAL ORDER, PERFORMED IN ~~LOC~~ HOSPITAL LAB): SARS Coronavirus 2: NEGATIVE

## 2018-06-20 MED ORDER — ADULT MULTIVITAMIN W/MINERALS CH
1.0000 | ORAL_TABLET | Freq: Every day | ORAL | Status: DC
  Administered 2018-06-21 – 2018-06-27 (×6): 1 via ORAL
  Filled 2018-06-20 (×8): qty 1

## 2018-06-20 MED ORDER — SODIUM CHLORIDE 0.9 % IV SOLN
8.0000 mg/h | INTRAVENOUS | Status: DC
  Administered 2018-06-20 – 2018-06-22 (×3): 8 mg/h via INTRAVENOUS
  Filled 2018-06-20 (×3): qty 80

## 2018-06-20 MED ORDER — INSULIN ASPART 100 UNIT/ML ~~LOC~~ SOLN: 0.0000 [IU] | Freq: Three times a day (TID) | SUBCUTANEOUS | Status: DC

## 2018-06-20 MED ORDER — SODIUM CHLORIDE 0.9 % IV SOLN
10.0000 mL/h | Freq: Once | INTRAVENOUS | Status: AC
  Administered 2018-06-22: 12:00:00 via INTRAVENOUS

## 2018-06-20 MED ORDER — FERROUS SULFATE 325 (65 FE) MG PO TABS
325.0000 mg | ORAL_TABLET | Freq: Two times a day (BID) | ORAL | Status: DC
  Administered 2018-06-21: 325 mg via ORAL
  Filled 2018-06-20: qty 1

## 2018-06-20 MED ORDER — SODIUM CHLORIDE 0.9 % IV SOLN
Freq: Once | INTRAVENOUS | Status: AC
  Administered 2018-06-22: 1000 mL via INTRAVENOUS

## 2018-06-20 MED ORDER — SODIUM CHLORIDE 0.9% FLUSH
3.0000 mL | Freq: Two times a day (BID) | INTRAVENOUS | Status: DC
  Administered 2018-06-21 – 2018-06-25 (×6): 3 mL via INTRAVENOUS

## 2018-06-20 MED ORDER — SODIUM CHLORIDE 0.9 % IV SOLN
80.0000 mg | Freq: Once | INTRAVENOUS | Status: AC
  Administered 2018-06-20: 80 mg via INTRAVENOUS
  Filled 2018-06-20: qty 80

## 2018-06-20 MED ORDER — ATORVASTATIN CALCIUM 20 MG PO TABS
40.0000 mg | ORAL_TABLET | Freq: Every day | ORAL | Status: DC
  Administered 2018-06-21 – 2018-06-26 (×6): 40 mg via ORAL
  Filled 2018-06-20 (×6): qty 2

## 2018-06-20 MED ORDER — FUROSEMIDE 40 MG PO TABS
40.0000 mg | ORAL_TABLET | Freq: Every day | ORAL | Status: DC
  Administered 2018-06-21 – 2018-06-27 (×7): 40 mg via ORAL
  Filled 2018-06-20 (×8): qty 1

## 2018-06-20 MED ORDER — CARVEDILOL 3.125 MG PO TABS
3.1250 mg | ORAL_TABLET | Freq: Two times a day (BID) | ORAL | Status: DC
  Administered 2018-06-21 – 2018-06-27 (×11): 3.125 mg via ORAL
  Filled 2018-06-20 (×12): qty 1

## 2018-06-20 MED ORDER — ACETAMINOPHEN 650 MG RE SUPP: 650.0000 mg | Freq: Four times a day (QID) | RECTAL | Status: DC | PRN

## 2018-06-20 MED ORDER — SODIUM CHLORIDE 0.9 % IV SOLN
1.0000 g | INTRAVENOUS | Status: DC
  Administered 2018-06-21 – 2018-06-25 (×6): 1 g via INTRAVENOUS
  Filled 2018-06-20 (×3): qty 1
  Filled 2018-06-20: qty 10
  Filled 2018-06-20: qty 1
  Filled 2018-06-20: qty 10
  Filled 2018-06-20: qty 1

## 2018-06-20 MED ORDER — INSULIN ASPART 100 UNIT/ML ~~LOC~~ SOLN
0.0000 [IU] | Freq: Every day | SUBCUTANEOUS | Status: DC
  Administered 2018-06-26: 2 [IU] via SUBCUTANEOUS
  Filled 2018-06-20: qty 1

## 2018-06-20 MED ORDER — MOMETASONE FURO-FORMOTEROL FUM 200-5 MCG/ACT IN AERO
2.0000 | INHALATION_SPRAY | Freq: Two times a day (BID) | RESPIRATORY_TRACT | Status: DC
  Administered 2018-06-21 – 2018-06-27 (×13): 2 via RESPIRATORY_TRACT
  Filled 2018-06-20: qty 8.8

## 2018-06-20 MED ORDER — SODIUM CHLORIDE 0.9 % IV SOLN
250.0000 mL | INTRAVENOUS | Status: DC | PRN
  Administered 2018-06-22 – 2018-06-25 (×6): 250 mL via INTRAVENOUS

## 2018-06-20 MED ORDER — FOLIC ACID 1 MG PO TABS: 1.0000 mg | ORAL_TABLET | Freq: Every day | ORAL | Status: DC

## 2018-06-20 MED ORDER — SODIUM CHLORIDE 0.9% FLUSH: 3.0000 mL | INTRAVENOUS | Status: DC | PRN

## 2018-06-20 MED ORDER — POLYETHYLENE GLYCOL 3350 17 G PO PACK: 17.0000 g | PACK | Freq: Every day | ORAL | Status: DC | PRN

## 2018-06-20 MED ORDER — VITAMIN B-1 100 MG PO TABS
100.0000 mg | ORAL_TABLET | Freq: Every day | ORAL | Status: DC
  Administered 2018-06-21 – 2018-06-27 (×6): 100 mg via ORAL
  Filled 2018-06-20 (×8): qty 1

## 2018-06-20 MED ORDER — SODIUM CHLORIDE 0.9% FLUSH: 3.0000 mL | Freq: Two times a day (BID) | INTRAVENOUS | Status: DC

## 2018-06-20 MED ORDER — METRONIDAZOLE IN NACL 5-0.79 MG/ML-% IV SOLN
500.0000 mg | Freq: Three times a day (TID) | INTRAVENOUS | Status: DC
  Administered 2018-06-21 – 2018-06-26 (×17): 500 mg via INTRAVENOUS
  Filled 2018-06-20 (×19): qty 100

## 2018-06-20 MED ORDER — ACETAMINOPHEN 325 MG PO TABS
650.0000 mg | ORAL_TABLET | Freq: Four times a day (QID) | ORAL | Status: DC | PRN
  Filled 2018-06-20: qty 2

## 2018-06-20 MED ORDER — INSULIN GLARGINE 100 UNIT/ML ~~LOC~~ SOLN
15.0000 [IU] | Freq: Every day | SUBCUTANEOUS | Status: DC
  Administered 2018-06-21 – 2018-06-27 (×7): 15 [IU] via SUBCUTANEOUS
  Filled 2018-06-20 (×8): qty 0.15

## 2018-06-20 MED ORDER — PANTOPRAZOLE SODIUM 40 MG IV SOLR: 40.0000 mg | Freq: Two times a day (BID) | INTRAVENOUS | Status: DC

## 2018-06-20 MED ORDER — FOLIC ACID 1 MG PO TABS
1.0000 mg | ORAL_TABLET | Freq: Every day | ORAL | Status: DC
  Administered 2018-06-21 – 2018-06-27 (×6): 1 mg via ORAL
  Filled 2018-06-20 (×8): qty 1

## 2018-06-20 NOTE — ED Notes (Signed)
Pt gave verbal consent for blood transfusion and asked this RN to sign for her.

## 2018-06-20 NOTE — ED Triage Notes (Addendum)
Pt to triage via w/c, mask in place with no distress noted; pt A&Ox3 but is a poor historian, st "just sick", reports elevated FSBS and pain to foot; currently being tx for gangrene of left great toe--pt apparently had debridement in April; wound bed pink with no drainage but is odorous; gauze dressing in place

## 2018-06-20 NOTE — ED Provider Notes (Signed)
[facility] Emergency Department Provider Note ____________________________________________   I have reviewed the triage vital signs and the nursing notes.   HISTORY  Chief Complaint Feeling sick  History limited by: poor historian   HPI [name] is a 63 y.o. female who presents to the emergency department today because she feels sick. She has a hard time saying how long she has felt sick for. She states she is having a hard time thinking. She denies any fevers. She denies noticing any bloody stool or black or tarry stool.    Records reviewed. Per medical record review patient has a history of CHF, COPD  Past Medical History:  Diagnosis Date  . Cervical cancer ([place])   . CHF (congestive heart failure) ([place])   . COPD (chronic obstructive pulmonary disease) ([place])   . Diabetes mellitus without complication ([place])   . NSTEMI (non-ST elevated myocardial infarction) [facility])     Patient Active Problem List   Diagnosis Date Noted  . CHF (congestive heart failure) ([place]) 01/13/2018  . B12 deficiency 12/07/2017  . Iron deficiency anemia due to chronic blood loss 12/07/2017  . Folate deficiency 12/07/2017  . GIB (gastrointestinal bleeding) 11/03/2017  . COPD (chronic obstructive pulmonary disease) ([place]) 09/26/2017  . Symptomatic anemia 09/26/2017  . Acute on chronic systolic CHF (congestive heart failure) ([place]) 08/27/2017  . Anemia   . Weakness   . GI bleed 08/07/2017  . Chronic systolic heart failure ([place]) 12/28/2016  . Diabetes ([place]) 12/28/2016  . Tobacco use 12/28/2016  . NSTEMI (non-ST elevated myocardial infarction) ([place])   . Sepsis ([place]) 12/18/2016  . CHF exacerbation ([place]) 12/04/2015  . PNA (pneumonia) 10/28/2015  . COPD exacerbation ([place]) 08/29/2015    Past Surgical History:  Procedure Laterality Date  . APPENDECTOMY    . CARDIAC CATHETERIZATION    . CHOLECYSTECTOMY    . COLONOSCOPY N/A 09/28/2017   Procedure: COLONOSCOPY;  Surgeon:  Toledo, Benay Pike, MD;  Location: [facility] ENDOSCOPY;  Service: Gastroenterology;  Laterality: N/A;  . ENTEROSCOPY Left 11/06/2017   Procedure: ENTEROSCOPY;  Surgeon: Jonathon Bellows, MD;  Location: [facility] ENDOSCOPY;  Service: Gastroenterology;  Laterality: Left;  . ESOPHAGOGASTRODUODENOSCOPY N/A 12/19/2016   Procedure: ESOPHAGOGASTRODUODENOSCOPY (EGD);  Surgeon: Lin Landsman, MD;  Location: [facility] ENDOSCOPY;  Service: Gastroenterology;  Laterality: N/A;  . ESOPHAGOGASTRODUODENOSCOPY N/A 09/28/2017   Procedure: ESOPHAGOGASTRODUODENOSCOPY (EGD);  Surgeon: Toledo, Benay Pike, MD;  Location: [facility] ENDOSCOPY;  Service: Gastroenterology;  Laterality: N/A;  . ESOPHAGOGASTRODUODENOSCOPY (EGD) WITH PROPOFOL N/A 08/08/2017   Procedure: ESOPHAGOGASTRODUODENOSCOPY (EGD) WITH PROPOFOL;  Surgeon: [name], MD;  Location: [facility] ENDOSCOPY;  Service: Endoscopy;  Laterality: N/A;  . heart [place] in [place]    . LEFT HEART CATH AND CORONARY ANGIOGRAPHY N/A 12/23/2016   Procedure: LEFT HEART CATH AND CORONARY ANGIOGRAPHY;  Surgeon: Teodoro Spray, MD;  Location: [place] CV LAB;  Service: Cardiovascular;  Laterality: N/A;    Prior to Admission medications   Medication Sig Start Date End Date Taking? Authorizing Provider  atorvastatin (LIPITOR) 40 MG tablet Take 1 tablet (40 mg total) by mouth daily. 01/16/18 05/16/18  Gladstone Lighter, MD  carvedilol (COREG) 3.125 MG tablet Take 1 tablet (3.125 mg total) by mouth 2 (two) times daily with a meal. 08/29/17   Dustin Flock, MD  ferrous sulfate 325 (65 FE) MG tablet Take 1 tablet (325 mg total) by mouth 2 (two) times daily with a meal. 09/30/17   Epifanio Lesches, MD  folic acid (FOLVITE) 1 MG tablet Take  1 tablet (1 mg total) by mouth daily. 09/30/17   Epifanio Lesches, MD  furosemide (LASIX) 40 MG tablet Take 1 tablet (40 mg total) by mouth daily. 01/16/18 01/16/19  Gladstone Lighter, MD  Insulin Glargine (LANTUS SOLOSTAR) 100 UNIT/ML Solostar Pen  Inject 15 Units into the skin daily. Patient taking differently: Inject 16 Units into the skin daily.  08/29/17   Dustin Flock, MD  omeprazole (PRILOSEC) 40 MG capsule Take 1 capsule (40 mg total) by mouth daily. 11/06/17 11/06/18  Dustin Flock, MD  ondansetron (ZOFRAN) 4 MG tablet Take 1 tablet (4 mg total) by mouth every 6 (six) hours as needed for nausea. 08/29/17   Dustin Flock, MD  pregabalin (LYRICA) 100 MG capsule Take 1-2 capsules (100-200 mg total) by mouth See admin instructions. Take 1 capsule (100MG) by mouth every morning and 2 capsules (200MG) by mouth every night 01/16/18   Gladstone Lighter, MD  PROAIR HFA 108 [phone] Base) MCG/ACT inhaler Inhale 1-2 puffs into the lungs every 4 (four) hours as needed. 10/10/17   [provider]  SYMBICORT 160-4.5 MCG/ACT inhaler Inhale 2 puffs into the lungs 2 (two) times daily. 10/10/17   [provider]    Allergies Latex and Gabapentin  Family History  Problem Relation Age of Onset  . Hypertension Mother   . Diabetes Mellitus II Mother   . Breast cancer Mother   . Heart disease Father   . Cancer Sister     Social History Social History   Tobacco Use  . Smoking status: Current Some Day Smoker    Packs/day: 0.25    Types: Cigarettes  . Smokeless tobacco: Never Used  Substance Use Topics  . Alcohol use: No  . Drug use: No    Review of Systems unable to be reliably obtained  ____________________________________________   PHYSICAL EXAM:  VITAL SIGNS: ED Triage Vitals  Enc Vitals Group     BP 06/20/18 2024 131/66     Pulse Rate 06/20/18 2024 93     Resp 06/20/18 2024 18     Temp 06/20/18 2024 98.4 F (36.9 C)     Temp Source 06/20/18 2024 Oral     SpO2 06/20/18 2024 100 %     Weight 06/20/18 2031 165 lb (74.8 kg)     Height 06/20/18 2031 5' 5"  (1.651 m)     Head Circumference --      Peak Flow --      Pain Score 06/20/18 2040 4   Constitutional: Alert and oriented.  Eyes: Conjunctivae are  normal.  ENT      Head: Normocephalic and atraumatic.      Nose: No congestion/rhinnorhea.      Mouth/Throat: Mucous membranes are moist.      Neck: No stridor. Hematological/Lymphatic/Immunilogical: No cervical lymphadenopathy. Cardiovascular: Normal rate, regular rhythm.  No murmurs, rubs, or gallops.  Respiratory: Normal respiratory effort without tachypnea nor retractions. Breath sounds are clear and equal bilaterally. No wheezes/rales/rhonchi. Gastrointestinal: Soft and non tender. No rebound. No guarding.  Genitourinary: Deferred Musculoskeletal: Normal range of motion in all extremities. No lower extremity edema. Neurologic:  Normal speech and language. No gross focal neurologic deficits are appreciated.  Skin:  Skin is warm, dry and intact. No rash noted. Psychiatric: Mood and affect are normal. Speech and behavior are normal. Patient exhibits appropriate insight and judgment.  ____________________________________________    LABS (pertinent positives/negatives)  Lactic acid 2.1 CBC wbc 7.8, hgb 3.8, plt 179 CMP na 135, k 4.0, glu 166,  cr 1.85  ____________________________________________   EKG  None  ____________________________________________    RADIOLOGY  None  ____________________________________________   PROCEDURES  Procedures  CRITICAL CARE Performed by: Nance Pear   Total critical care time: 30 minutes  Critical care time was exclusive of separately billable procedures and treating other patients.  Critical care was necessary to treat or prevent imminent or life-threatening deterioration.  Critical care was time spent personally by me on the following activities: development of treatment plan with patient and/or surrogate as well as nursing, discussions with consultants, evaluation of patient's response to treatment, examination of patient, obtaining history from patient or surrogate, ordering and performing treatments and interventions,  ordering and review of laboratory studies, ordering and review of radiographic studies, pulse oximetry and re-evaluation of patient's condition.  ____________________________________________   INITIAL IMPRESSION / ASSESSMENT AND PLAN / ED COURSE  Pertinent labs & imaging results that were available during my care of the patient were reviewed by me and considered in my medical decision making (see chart for details).   Patient presented to the emergency department today feeling sick.  She was having a hard time explaining exactly what she meant and clearly was having a hard time concentrating.  Blood work showed significant anemia.  Guaiac was very minimally positive.  Do think the anemia could be affecting the patient's mentation at this point.  Discussed with patient findings.  Will plan on transfusing blood and admission.  ___________________________________   FINAL CLINICAL IMPRESSION(S) / ED DIAGNOSES  Final diagnoses:  Anemia, unspecified type     Note: This dictation was prepared with Dragon dictation. Any transcriptional errors that result from this process are unintentional     Nance Pear, MD 06/20/18 2231

## 2018-06-21 ENCOUNTER — Inpatient Hospital Stay: Payer: Medicaid Other

## 2018-06-21 DIAGNOSIS — K5520 Angiodysplasia of colon without hemorrhage: Secondary | ICD-10-CM

## 2018-06-21 LAB — BASIC METABOLIC PANEL
Anion gap: 11 (ref 5–15)
BUN: 24 mg/dL — ABNORMAL HIGH (ref 8–23)
CO2: 18 mmol/L — ABNORMAL LOW (ref 22–32)
Calcium: 8.4 mg/dL — ABNORMAL LOW (ref 8.9–10.3)
Chloride: 105 mmol/L (ref 98–111)
Creatinine, Ser: 1.75 mg/dL — ABNORMAL HIGH (ref 0.44–1.00)
GFR calc Af Amer: 35 mL/min — ABNORMAL LOW (ref >60)
GFR calc non Af Amer: 30 mL/min — ABNORMAL LOW (ref >60)
Glucose, Bld: 120 mg/dL — ABNORMAL HIGH (ref 70–99)
Potassium: 4.1 mmol/L (ref 3.5–5.1)
Sodium: 134 mmol/L — ABNORMAL LOW (ref 135–145)

## 2018-06-21 LAB — CBC
HCT: 27.2 % — ABNORMAL LOW (ref 36.0–46.0)
Hemoglobin: 8.2 g/dL — ABNORMAL LOW (ref 12.0–15.0)
MCH: 23.6 pg — ABNORMAL LOW (ref 26.0–34.0)
MCHC: 30.1 g/dL (ref 30.0–36.0)
MCV: 78.4 fL — ABNORMAL LOW (ref 80.0–100.0)
Platelets: 183 10*3/uL (ref 150–400)
RBC: 3.47 MIL/uL — ABNORMAL LOW (ref 3.87–5.11)
RDW: 21.1 % — ABNORMAL HIGH (ref 11.5–15.5)
WBC: 7.5 10*3/uL (ref 4.0–10.5)
nRBC: 0.5 % — ABNORMAL HIGH (ref 0.0–0.2)

## 2018-06-21 LAB — LACTIC ACID, PLASMA
Lactic Acid, Venous: 1.5 mmol/L (ref 0.5–1.9)
Lactic Acid, Venous: 1 mmol/L (ref 0.5–1.9)

## 2018-06-21 LAB — CBC WITH DIFFERENTIAL/PLATELET
Abs Immature Granulocytes: 0.04 10*3/uL (ref 0.00–0.07)
Basophils Absolute: 0.1 10*3/uL (ref 0.0–0.1)
Basophils Relative: 1 %
Eosinophils Absolute: 0.1 10*3/uL (ref 0.0–0.5)
Eosinophils Relative: 1 %
HCT: 24.2 % — ABNORMAL LOW (ref 36.0–46.0)
Hemoglobin: 7.3 g/dL — ABNORMAL LOW (ref 12.0–15.0)
Immature Granulocytes: 1 %
Lymphocytes Relative: 12 %
Lymphs Abs: 0.9 10*3/uL (ref 0.7–4.0)
MCH: 23.7 pg — ABNORMAL LOW (ref 26.0–34.0)
MCHC: 30.2 g/dL (ref 30.0–36.0)
MCV: 78.6 fL — ABNORMAL LOW (ref 80.0–100.0)
Monocytes Absolute: 0.7 10*3/uL (ref 0.1–1.0)
Monocytes Relative: 9 %
Neutro Abs: 6 10*3/uL (ref 1.7–7.7)
Neutrophils Relative %: 76 %
Platelets: 183 10*3/uL (ref 150–400)
RBC: 3.08 MIL/uL — ABNORMAL LOW (ref 3.87–5.11)
RDW: 20.8 % — ABNORMAL HIGH (ref 11.5–15.5)
WBC: 7.8 10*3/uL (ref 4.0–10.5)
nRBC: 0.5 % — ABNORMAL HIGH (ref 0.0–0.2)

## 2018-06-21 LAB — PREPARE RBC (CROSSMATCH)

## 2018-06-21 LAB — FERRITIN: Ferritin: 9 ng/mL — ABNORMAL LOW (ref 11–307)

## 2018-06-21 LAB — HEMOGLOBIN A1C
Hgb A1c MFr Bld: 5.9 % — ABNORMAL HIGH (ref 4.8–5.6)
Mean Plasma Glucose: 122.63 mg/dL

## 2018-06-21 LAB — HEMOGLOBIN AND HEMATOCRIT, BLOOD
HCT: 23.5 % — ABNORMAL LOW (ref 36.0–46.0)
Hemoglobin: 7.1 g/dL — ABNORMAL LOW (ref 12.0–15.0)

## 2018-06-21 LAB — GLUCOSE, CAPILLARY
Glucose-Capillary: 115 mg/dL — ABNORMAL HIGH (ref 70–99)
Glucose-Capillary: 118 mg/dL — ABNORMAL HIGH (ref 70–99)
Glucose-Capillary: 113 mg/dL — ABNORMAL HIGH (ref 70–99)
Glucose-Capillary: 124 mg/dL — ABNORMAL HIGH (ref 70–99)

## 2018-06-21 LAB — FOLATE: Folate: 14.4 ng/mL (ref >5.9)

## 2018-06-21 LAB — VITAMIN B12: Vitamin B-12: 528 pg/mL (ref 180–914)

## 2018-06-21 LAB — TSH: TSH: 1.964 u[IU]/mL (ref 0.350–4.500)

## 2018-06-21 MED ORDER — SODIUM CHLORIDE 0.9% FLUSH: 10.0000 mL | INTRAVENOUS | Status: DC | PRN

## 2018-06-21 MED ORDER — MORPHINE SULFATE (PF) 2 MG/ML IV SOLN
2.0000 mg | Freq: Once | INTRAVENOUS | Status: AC
  Administered 2018-06-21: 2 mg via INTRAVENOUS
  Filled 2018-06-21: qty 1

## 2018-06-21 MED ORDER — FUROSEMIDE 10 MG/ML IJ SOLN
20.0000 mg | Freq: Once | INTRAMUSCULAR | Status: AC
  Administered 2018-06-21: 20 mg via INTRAVENOUS
  Filled 2018-06-21: qty 2

## 2018-06-21 MED ORDER — SODIUM CHLORIDE 0.9 % IV SOLN
300.0000 mg | Freq: Once | INTRAVENOUS | Status: AC
  Administered 2018-06-21: 14:00:00 300 mg via INTRAVENOUS
  Filled 2018-06-21: qty 15

## 2018-06-21 MED ORDER — SODIUM CHLORIDE 0.9% FLUSH
10.0000 mL | Freq: Two times a day (BID) | INTRAVENOUS | Status: DC
  Administered 2018-06-21 – 2018-06-25 (×5): 10 mL

## 2018-06-21 MED ORDER — PEG 3350-KCL-NA BICARB-NACL 420 G PO SOLR
4000.0000 mL | Freq: Once | ORAL | Status: DC
  Filled 2018-06-21: qty 4000

## 2018-06-21 MED ORDER — SODIUM CHLORIDE 0.9% IV SOLUTION
Freq: Once | INTRAVENOUS | Status: AC
  Administered 2018-06-21: 06:00:00 via INTRAVENOUS

## 2018-06-21 MED ORDER — SODIUM CHLORIDE 0.9 % IV SOLN
510.0000 mg | Freq: Once | INTRAVENOUS | Status: AC
  Administered 2018-06-22: 510 mg via INTRAVENOUS
  Filled 2018-06-21: qty 17

## 2018-06-21 MED ORDER — MAGNESIUM CITRATE PO SOLN
1.0000 | Freq: Once | ORAL | Status: DC
  Filled 2018-06-21: qty 296

## 2018-06-21 NOTE — Progress Notes (Signed)
Dr. 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 Let MD know though that Dr. Marius Ditch is also planning to do a enterescopy tomorrow. No other concern at the moment. RN will continue to monitor.

## 2018-06-21 NOTE — Consult Note (Signed)
[facility] Nurse wound consult note Reason for Consult: 3 wounds on left foot Wound type: neuropathic, infectious Pressure Injury POA: NA Measurement: Left lateral great toe: 3cm x 2cm with depth unable to be determined due to the presence of necrotic tissue.  See debridement note below. 3rd and 4th posterior digits: black discoloration areas indicative of poor perfusion. Very tender to palpation.  Wound bed:As noted above Drainage (amount, consistency, odor) light yellow and malodorous on old dressing Periwound: dry, intact Dressing procedure/placement/frequency: I have provided conservative orders for twice daily care by the bedside RN using a betadine swabstick application to the posterior 3rd and 4th digits and a saline dressing to the left lateral great toe (hallux). Toenail length and joint deformity are noteworthy. Conservative sharp wound debridement (CSWD performed at the bedside): Yes.  Removal of loose, hanging necrotic and malodorous tissue from center of left lateral great toe wound using sterile iris scissors and pickups. Area cleansed prior to procedure and no bleeding occurred.  Recommend consultation with vascular or podiatric surgery for evaluation of foot and to determine salvageability. If you agree, please order/arrange consult.   [facility] nursing team will not follow, but will remain available to this patient, the nursing and medical teams.  Please re-consult if needed. Thanks, Maudie Flakes, MSN, RN, [facility], Arther Abbott  Pager# [phone]

## 2018-06-21 NOTE — ED Notes (Signed)
ED TO INPATIENT HANDOFF REPORT  ED Nurse Name and Phone #: Terri Piedra [phone]  S Name/Age/Gender [name] 63 y.o. female Room/Bed: ED05A/ED05A  Code Status   Code Status: Full Code  Home/SNF/Other Home Patient oriented to: self, place, time and situation Is this baseline? 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 Latex Anaphylaxis and Rash  . Gabapentin Nausea And Vomiting and Other (See Comments)    Level of Care/Admitting Diagnosis ED Disposition    ED Disposition Condition [facility]: [place] [[id number]]  Level of Care: Med-Surg [16]  Covid Evaluation: N/A  Diagnosis: Severe anemia [[id number]]  Admitting Physician: Hillary Bow [[id number]]  Attending Physician: Hillary Bow [[id number]]  Estimated length of stay: past midnight tomorrow  Certification:: I certify this patient will need inpatient services for at least 2 midnights  PT Class (Do Not Modify): Inpatient [101]  PT Acc Code (Do Not Modify): Private [1]       B Medical/Surgery History Past Medical History:  Diagnosis Date  . Cervical cancer ([place])   . CHF (congestive heart failure) ([place])   . COPD (chronic obstructive pulmonary disease) ([place])   . Diabetes mellitus without complication ([place])   . NSTEMI (non-ST elevated myocardial infarction) [facility])    Past Surgical History:  Procedure Laterality Date  . APPENDECTOMY    . CARDIAC CATHETERIZATION    . CHOLECYSTECTOMY    . COLONOSCOPY N/A 09/28/2017   Procedure: COLONOSCOPY;  Surgeon: Toledo, Benay Pike, MD;  Location:  [facility] ENDOSCOPY;  Service: Gastroenterology;  Laterality: N/A;  . ENTEROSCOPY Left 11/06/2017   Procedure: ENTEROSCOPY;  Surgeon: Jonathon Bellows, MD;  Location: [facility] ENDOSCOPY;  Service: Gastroenterology;  Laterality: Left;  . ESOPHAGOGASTRODUODENOSCOPY N/A 12/19/2016   Procedure: ESOPHAGOGASTRODUODENOSCOPY (EGD);  Surgeon: Lin Landsman, MD;  Location: [facility] ENDOSCOPY;  Service: Gastroenterology;  Laterality: N/A;  . ESOPHAGOGASTRODUODENOSCOPY N/A 09/28/2017   Procedure: ESOPHAGOGASTRODUODENOSCOPY (EGD);  Surgeon: Toledo, Benay Pike, MD;  Location: [facility] ENDOSCOPY;  Service: Gastroenterology;  Laterality: N/A;  . ESOPHAGOGASTRODUODENOSCOPY (EGD) WITH PROPOFOL N/A 08/08/2017   Procedure: ESOPHAGOGASTRODUODENOSCOPY (EGD) WITH PROPOFOL;  Surgeon: [name], MD;  Location: [facility] ENDOSCOPY;  Service: Endoscopy;  Laterality: N/A;  . [place]    . LEFT HEART CATH AND CORONARY ANGIOGRAPHY N/A 12/23/2016   Procedure: LEFT HEART CATH AND CORONARY ANGIOGRAPHY;  Surgeon: Teodoro Spray, MD;  Location: [place] CV LAB;  Service: Cardiovascular;  Laterality: N/A;     A IV Location/Drains/Wounds Patient Lines/Drains/Airways Status   Active Line/Drains/Airways    Name:   Placement date:   Placement time:   Site:   Days:   Peripheral IV 06/20/18 Left Antecubital   06/20/18    2152    Antecubital   1          Intake/Output Last 24 hours  Intake/Output Summary (Last 24 hours) at 06/21/2018 0013 Last data filed at 06/20/2018 2306 Gross per 24 hour  Intake 429 ml  Output -  Net 429 ml    Labs/Imaging Results for orders placed or performed  during the hospital encounter of 06/20/18 (from the past 48 hour(s))  Glucose, capillary     Status: Abnormal   Collection Time: 06/20/18  8:35 PM  Result Value Ref Range   Glucose-Capillary 148 (H) 70 - 99 mg/dL  CBC with Differential     Status: Abnormal   Collection Time: 06/20/18  8:38 PM  Result Value Ref Range   WBC 7.8 4.0 - 10.5  K/uL   RBC 1.96 (L) 3.87 - 5.11 MIL/uL   Hemoglobin 3.8 (LL) 12.0 - 15.0 g/dL    Comment: Reticulocyte Hemoglobin testing may be clinically indicated, consider ordering this additional test [id number] THIS CRITICAL RESULT HAS VERIFIED AND BEEN CALLED TO AMY COYNE BY QUANISHA DYSON ON 06 03 2020 AT 2103, AND HAS BEEN READ BACK.     HCT 14.4 (LL) 36.0 - 46.0 %    Comment: This critical result has verified and been called to AMY COYNE by Silvana Newness on 06 03 2020 at 2103, and has been read back.    MCV 73.5 (L) 80.0 - 100.0 fL   MCH 19.4 (L) 26.0 - 34.0 pg   MCHC 26.4 (L) 30.0 - 36.0 g/dL   RDW 20.8 (H) 11.5 - 15.5 %   Platelets 179 150 - 400 K/uL   nRBC 0.8 (H) 0.0 - 0.2 %   Neutrophils Relative % 64 %   Neutro Abs 5.0 1.7 - 7.7 K/uL   Lymphocytes Relative 22 %   Lymphs Abs 1.7 0.7 - 4.0 K/uL   Monocytes Relative 11 %   Monocytes Absolute 0.9 0.1 - 1.0 K/uL   Eosinophils Relative 2 %   Eosinophils Absolute 0.1 0.0 - 0.5 K/uL   Basophils Relative 0 %   Basophils Absolute 0.0 0.0 - 0.1 K/uL   WBC Morphology MORPHOLOGY UNREMARKABLE    Smear Review Normal platelet morphology    Immature Granulocytes 1 %   Abs Immature Granulocytes 0.04 0.00 - 0.07 K/uL   Dimorphism PRESENT    Polychromasia PRESENT     Comment: Performed at [facility], [address] [place], [place] [address]  Comprehensive metabolic panel     Status: Abnormal   Collection Time: 06/20/18  8:38 PM  Result Value Ref Range   Sodium 135 135 - 145 mmol/L   Potassium 4.0 3.5 - 5.1 mmol/L   Chloride 110 98 - 111 mmol/L   CO2 17 (L) 22 - 32 mmol/L   Glucose, Bld 166 (H) 70 - 99 mg/dL   BUN 26 (H) 8 - 23 mg/dL   Creatinine, Ser 1.85 (H) 0.44 - 1.00 mg/dL   Calcium 8.3 (L) 8.9 - 10.3 mg/dL   Total Protein 7.8 6.5 - 8.1 g/dL   Albumin 3.0 (L) 3.5 - 5.0 g/dL   AST 12 (L) 15 - 41 U/L   ALT 8 0 - 44 U/L   Alkaline Phosphatase 94 38 - 126 U/L   Total Bilirubin 0.4 0.3 - 1.2 mg/dL   GFR calc non Af Amer 28  (L) >60 mL/min   GFR calc Af Amer 33 (L) >60 mL/min   Anion gap 8 5 - 15    Comment: Performed at [facility], [address] [place], [place] [address]  Lactic acid, plasma     Status: Abnormal   Collection Time: 06/20/18  8:39 PM  Result Value Ref Range   Lactic Acid, Venous 2.1 (HH) 0.5 - 1.9 mmol/L    Comment: CRITICAL RESULT CALLED TO, READ BACK BY AND  VERIFIED WITH AMY COYNE ON 06/20/2018 AT 2102 QSD Performed at [facility], [address] [place], [place] [address]   Type and screen [place]     Status: None (Preliminary result)   Collection Time: 06/20/18  9:40 PM  Result Value Ref Range   ABO/RH(D) O POS    Antibody Screen NEG    Sample Expiration 06/23/2018,2359    Unit Number [id number]    Blood Component Type RBC LR PHER1    Unit division 00    Status of Unit ISSUED    Transfusion Status OK TO TRANSFUSE    Crossmatch Result      Compatible Performed at [facility], [address] [place], [place] [address]    Unit Number [id number]    Blood Component Type RED CELLS,LR    Unit division 00    Status of Unit ISSUED    Transfusion Status OK TO TRANSFUSE    Crossmatch Result Compatible   SARS Coronavirus 2 (CEPHEID- Performed in [facility] hospital lab), Hosp Order     Status: None   Collection Time: 06/20/18  9:40 PM  Result Value Ref Range   SARS Coronavirus 2 NEGATIVE NEGATIVE    Comment: (NOTE) If result is NEGATIVE SARS-CoV-2 target nucleic acids are NOT DETECTED. The SARS-CoV-2 RNA is generally detectable in upper and lower  respiratory specimens during the acute phase of infection. The lowest  concentration of SARS-CoV-2 viral copies this assay can detect is 250  copies / mL. A negative result does not preclude SARS-CoV-2 infection  and should not be used as the sole basis for treatment or other  patient management decisions.  A negative result may occur with  improper specimen collection /  handling, submission of specimen other  than nasopharyngeal swab, presence of viral mutation(s) within the  areas targeted by this assay, and inadequate number of viral copies  (<250 copies / mL). A negative result must be combined with clinical  observations, patient history, and epidemiological information. If result is POSITIVE SARS-CoV-2 target nucleic acids are DETECTED. The SARS-CoV-2 RNA is generally detectable in upper and lower  respiratory specimens dur ing the acute phase of infection.  Positive  results are indicative of active infection with SARS-CoV-2.  Clinical  correlation with patient history and other diagnostic information is  necessary to determine patient infection status.  Positive results do  not rule out bacterial infection or co-infection with other viruses. If result is PRESUMPTIVE POSTIVE SARS-CoV-2 nucleic acids MAY BE PRESENT.   A presumptive positive result was obtained on the submitted specimen  and confirmed on repeat testing.  While 2019 novel coronavirus  (SARS-CoV-2) nucleic acids may be present in the submitted sample  additional confirmatory testing may be necessary for epidemiological  and / or clinical management purposes  to differentiate between  SARS-CoV-2 and other Sarbecovirus currently known to infect humans.  If clinically indicated additional testing with an alternate test  methodology [phone]) is advised. The SARS-CoV-2 RNA is generally  detectable in upper and lower respiratory sp ecimens during the acute  phase of infection. The expected result is Negative. Fact Sheet for Patients:  StrictlyIdeas.no Fact Sheet for Healthcare Providers: BankingDealers.co.za This test is not yet approved or cleared by the [place] FDA and has been authorized for detection and/or diagnosis of SARS-CoV-2 by FDA under an Emergency Use Authorization (EUA).  This EUA will remain in effect (meaning this  test can be used) for the duration of the  COVID-19 declaration under Section 564(b)(1) of the Act, 21 U.S.C. section 360bbb-3(b)(1), unless the authorization is terminated or revoked sooner. Performed at [facility], [address] [place], [place] [address]   Prepare RBC     Status: None   Collection Time: 06/20/18  9:40 PM  Result Value Ref Range   Order Confirmation      ORDER PROCESSED BY BLOOD BANK Performed at [facility], [address] [place], [place] [address]   Prepare RBC     Status: None   Collection Time: 06/20/18 10:44 PM  Result Value Ref Range   Order Confirmation      ORDER PROCESSED BY BLOOD BANK Performed at [facility], [address] [place], [place] [address]   Lactic acid, plasma     Status: Abnormal   Collection Time: 06/20/18 10:56 PM  Result Value Ref Range   Lactic Acid, Venous 2.2 (HH) 0.5 - 1.9 mmol/L    Comment: CRITICAL RESULT CALLED TO, READ BACK BY AND VERIFIED WITH GRACIE [facility] ON 06/20/2018 AT 2321 QSD Performed at [facility], [address] [place], [place] [address]   Iron and TIBC     Status: Abnormal   Collection Time: 06/20/18 10:56 PM  Result Value Ref Range   Iron 10 (L) 28 - 170 ug/dL   TIBC 336 250 - 450 ug/dL   Saturation Ratios 3 (L) 10.4 - 31.8 %   UIBC 326 ug/dL    Comment: Performed at [facility], [address] [place], [place] [address]   No results found.  Pending Labs Unresulted Labs (From admission, onward)    Start     Ordered   06/21/18 2241  Hemoglobin and hematocrit, blood  Once,   STAT    Comments:  After second unit PRBC's transfuse    06/20/18 2241   06/21/18 2878  Basic metabolic panel  Tomorrow morning,   STAT     06/20/18 2312   06/21/18 0500  CBC  Tomorrow morning,   STAT     06/20/18 2312   06/21/18 0000  Hemoglobin and hematocrit, blood  Now then every 8 hours,   STAT     06/20/18 2245   06/20/18 2308  Aerobic Culture (superficial  specimen)  Once,   STAT    Question:  Patient immune status  Answer:  Normal   06/20/18 2312   06/20/18 2307  CULTURE, BLOOD (ROUTINE X 2) w Reflex to ID Panel  BLOOD CULTURE X 2,   STAT    Question:  Patient immune status  Answer:  Normal   06/20/18 2312   06/20/18 2305  Hemoglobin A1c  Once,   STAT     06/20/18 2312   06/20/18 2305  TSH  Once,   STAT     06/20/18 2312   06/20/18 2244  [facility] HLA ABC typing  Once,   STAT     06/20/18 2244          Vitals/Pain Today's Vitals   06/20/18 2040 06/20/18 2125 06/20/18 2300 06/20/18 2317  BP:   (!) 135/59 129/60  Pulse:   93 91  Resp:   18 18  Temp:   98.1 F (36.7 C) 98.4 F (36.9 C)  TempSrc:   Oral Oral  SpO2:   100% 100%  Weight:      Height:      PainSc: 4  4       Isolation Precautions Droplet and Contact precautions  Medications Medications  0.9 %  sodium chloride infusion (has no administration in time range)  pantoprazole (PROTONIX) 80 mg in sodium chloride 0.9 % 250 mL (0.32 mg/mL) infusion (8 mg/hr Intravenous New Bag/Given 06/20/18 2205)  pantoprazole (PROTONIX) injection 40 mg (has no administration in time range)  atorvastatin (LIPITOR) tablet 40 mg (has no administration in time range)  carvedilol (COREG) tablet 3.125 mg (has no administration in time range)  ferrous sulfate tablet 325 mg (has no administration in time range)  folic acid (FOLVITE) tablet 1 mg (has no administration in time range)  furosemide (LASIX) tablet 40 mg (has no administration in time range)  insulin glargine (LANTUS) injection 15 Units (has no administration in time range)  mometasone-formoterol (DULERA) 200-5 MCG/ACT inhaler 2 puff (has no administration in time range)  0.9 %  sodium chloride infusion (has no administration in time range)  sodium chloride flush (NS) 0.9 % injection 3 mL (3 mLs Intravenous Not Given 06/20/18 2340)  sodium chloride flush (NS) 0.9 % injection 3 mL (has no administration in time range)  0.9 %  sodium  chloride infusion (has no administration in time range)  acetaminophen (TYLENOL) tablet 650 mg (has no administration in time range)    Or  acetaminophen (TYLENOL) suppository 650 mg (has no administration in time range)  polyethylene glycol (MIRALAX / GLYCOLAX) packet 17 g (has no administration in time range)  multivitamin with minerals tablet 1 tablet (has no administration in time range)  thiamine (VITAMIN B-1) tablet 100 mg (has no administration in time range)  insulin aspart (novoLOG) injection 0-15 Units (has no administration in time range)  insulin aspart (novoLOG) injection 0-5 Units (has no administration in time range)  cefTRIAXone (ROCEPHIN) 1 g in sodium chloride 0.9 % 100 mL IVPB (has no administration in time range)  metroNIDAZOLE (FLAGYL) IVPB 500 mg (has no administration in time range)  pantoprazole (PROTONIX) 80 mg in sodium chloride 0.9 % 100 mL IVPB (0 mg Intravenous Stopped 06/20/18 2242)    Mobility walks with person assist Low fall risk   Focused Assessments Cardiac Assessment Handoff:  Cardiac Rhythm: Normal sinus rhythm Lab Results  Component Value Date   CKTOTAL 124 09/28/2006   CKMB 1.3 09/28/2006   TROPONINI <0.03 01/14/2018   No results found for: DDIMER Does the Patient currently have chest pain? No     R Recommendations: See Admitting Provider Note  Report given to:   Additional Notes:

## 2018-06-21 NOTE — Progress Notes (Signed)
Advanced care plan.  Purpose of the Encounter: CODE STATUS  Parties in Attendance: Patient herself  Patient's Decision Capacity: Intact  Subjective/Patient's story: Patient 63 year old African-American female with history of recurrent anemia, diabetes type 2, COPD, history of CHF presenting with recurrent anemia.  Patient's evaluation in the ER shows that she was severely anemic requiring 3 units of packed RBCs.   Objective/Medical story  I discussed with the patient regarding her desires for cardiac and pulmonary resuscitation.  Also asked her if she had a living will or healthcare power of attorney.  Goals of care determination:  Patient states that she would like everything to be done and wants to be a full code.  She will discuss with her family regarding living will and healthcare power of attorney   CODE STATUS: Full code   Time spent discussing advanced care planning: 16 minutes

## 2018-06-21 NOTE — Consult Note (Addendum)
ORTHOPAEDIC CONSULTATION  REQUESTING PHYSICIAN: Dustin Flock, MD  Chief Complaint: Left great toe pain  HPI: [name] is a 63 y.o. female who complains of painful left great toe.  She has an obvious ulcer on her left great toe upon admission.  Patient states is been present for couple months.  She has not seen anyone in regards to this.  Past Medical History:  Diagnosis Date  . Cervical cancer ([place])   . CHF (congestive heart failure) ([place])   . COPD (chronic obstructive pulmonary disease) ([place])   . Diabetes mellitus without complication ([place])   . NSTEMI (non-ST elevated myocardial infarction) [facility])    Past Surgical History:  Procedure Laterality Date  . APPENDECTOMY    . CARDIAC CATHETERIZATION    . CHOLECYSTECTOMY    . COLONOSCOPY N/A 09/28/2017   Procedure: COLONOSCOPY;  Surgeon: Toledo, Benay Pike, MD;  Location: [facility] ENDOSCOPY;  Service: Gastroenterology;  Laterality: N/A;  . ENTEROSCOPY Left 11/06/2017   Procedure: ENTEROSCOPY;  Surgeon: Jonathon Bellows, MD;  Location: [facility] ENDOSCOPY;  Service: Gastroenterology;  Laterality: Left;  . ESOPHAGOGASTRODUODENOSCOPY N/A 12/19/2016   Procedure: ESOPHAGOGASTRODUODENOSCOPY (EGD);  Surgeon: Lin Landsman, MD;  Location: [facility] ENDOSCOPY;  Service: Gastroenterology;  Laterality: N/A;  . ESOPHAGOGASTRODUODENOSCOPY N/A 09/28/2017   Procedure: ESOPHAGOGASTRODUODENOSCOPY (EGD);  Surgeon: Toledo, Benay Pike, MD;  Location: [facility] ENDOSCOPY;  Service: Gastroenterology;  Laterality: N/A;  . ESOPHAGOGASTRODUODENOSCOPY (EGD) WITH PROPOFOL N/A 08/08/2017   Procedure: ESOPHAGOGASTRODUODENOSCOPY (EGD) WITH PROPOFOL;  Surgeon: [name], MD;  Location: [facility] ENDOSCOPY;  Service: Endoscopy;  Laterality: N/A;  . heart [place]    . LEFT HEART CATH AND CORONARY ANGIOGRAPHY N/A 12/23/2016   Procedure: LEFT HEART CATH AND CORONARY ANGIOGRAPHY;  Surgeon: Teodoro Spray, MD;  Location: [facility] LAB;  Service: Cardiovascular;   Laterality: N/A;   Social History   Socioeconomic History  . Marital status: Married    Spouse name: Not on file  . Number of children: Not on file  . Years of education: Not on file  . Highest education level: Not on file  Occupational History  . Not on file  Social Needs  . Financial resource strain: Not on file  . Food insecurity:    Worry: Not on file    Inability: Not on file  . Transportation needs:    Medical: Not on file    Non-medical: Not on file  Tobacco Use  . Smoking status: Current Some Day Smoker    Packs/day: 0.25    Types: Cigarettes  . Smokeless tobacco: Never Used  Substance and Sexual Activity  . Alcohol use: No  . Drug use: No  . Sexual activity: Not on file  Lifestyle  . Physical activity:    Days per week: Not on file    Minutes per session: Not on file  . Stress: Not on file  Relationships  . Social connections:    Talks on phone: Not on file    Gets together: Not on file    Attends religious service: Not on file    Active member of club or organization: Not on file    Attends meetings of clubs or organizations: Not on file    Relationship status: Not on file  Other Topics Concern  . Not on file  Social History Narrative  . Not on file   Family History  Problem Relation Age of Onset  . Hypertension Mother   . Diabetes Mellitus II Mother   . Breast  cancer Mother   . Heart disease Father   . Cancer Sister    Allergies  Allergen Reactions  . Latex Anaphylaxis and Rash  . Gabapentin Nausea And Vomiting and Other (See Comments)   Prior to Admission medications   Medication Sig Start Date End Date Taking? Authorizing Provider  atorvastatin (LIPITOR) 40 MG tablet Take 1 tablet (40 mg total) by mouth daily. 01/16/18 05/16/18  Gladstone Lighter, MD  carvedilol (COREG) 3.125 MG tablet Take 1 tablet (3.125 mg total) by mouth 2 (two) times daily with a meal. 08/29/17   Dustin Flock, MD  ferrous sulfate 325 (65 FE) MG tablet Take 1 tablet  (325 mg total) by mouth 2 (two) times daily with a meal. 09/30/17   Epifanio Lesches, MD  folic acid (FOLVITE) 1 MG tablet Take 1 tablet (1 mg total) by mouth daily. 09/30/17   Epifanio Lesches, MD  furosemide (LASIX) 40 MG tablet Take 1 tablet (40 mg total) by mouth daily. 01/16/18 01/16/19  Gladstone Lighter, MD  Insulin Glargine (LANTUS SOLOSTAR) 100 UNIT/ML Solostar Pen Inject 15 Units into the skin daily. Patient taking differently: Inject 16 Units into the skin daily.  08/29/17   Dustin Flock, MD  omeprazole (PRILOSEC) 40 MG capsule Take 1 capsule (40 mg total) by mouth daily. 11/06/17 11/06/18  Dustin Flock, MD  ondansetron (ZOFRAN) 4 MG tablet Take 1 tablet (4 mg total) by mouth every 6 (six) hours as needed for nausea. 08/29/17   Dustin Flock, MD  pregabalin (LYRICA) 100 MG capsule Take 1-2 capsules (100-200 mg total) by mouth See admin instructions. Take 1 capsule (100MG) by mouth every morning and 2 capsules (200MG) by mouth every night 01/16/18   Gladstone Lighter, MD  PROAIR HFA [phone] Base) MCG/ACT inhaler Inhale 1-2 puffs into the lungs every 4 (four) hours as needed. 10/10/17   [provider]  SYMBICORT 160-4.5 MCG/ACT inhaler Inhale 2 puffs into the lungs 2 (two) times daily. 10/10/17   [provider]   Ct Abdomen Wo Contrast  Result Date: 06/21/2018 CLINICAL DATA:  63 year old female with abdominal pain. Concern for gastroenteritis or colitis. EXAM: CT ABDOMEN WITHOUT CONTRAST TECHNIQUE: Multidetector CT imaging of the abdomen was performed following the standard protocol without IV contrast. COMPARISON:  CT of the abdomen pelvis dated 03/12/2012 FINDINGS: Evaluation of this exam is limited in the absence of intravenous contrast. Lower chest: There is cardiomegaly with diffuse interstitial and interlobular septal prominence consistent with edema. Small bilateral pleural effusions noted. There is calcification of the mitral annulus. There is  hypoattenuation of the cardiac blood pool suggestive of a degree of anemia. Clinical correlation is recommended. No free air noted in the visualized abdomen. There is a small perihepatic ascites. Hepatobiliary: Slight irregularity of the liver contour suggestive of early cirrhosis, likely related to passive congestion and heart failure. Cholecystectomy. Pancreas: Unremarkable. No pancreatic ductal dilatation or surrounding inflammatory changes. Spleen: Normal in size without focal abnormality. Adrenals/Urinary Tract: The adrenal glands are unremarkable. There is no hydronephrosis on either side. Renal vascular calcifications noted. There is a 6 cm left renal inferior pole exophytic cysts. Additional smaller left renal hypodense lesions are not characterized but most likely represent cysts. The visualized proximal ureters are unremarkable. Stomach/Bowel: There is moderate stool in the visualized colon. No dilated bowel noted in the abdomen. Please note evaluation of the bowel is limited as the distal bowel in the lower abdomen and pelvis are not included in the CT of the abdomen. Vascular/Lymphatic: There is  moderate atherosclerotic calcification of the aorta. No portal venous gas. There is no adenopathy. Other: Diffuse subcutaneous edema. Small fat containing supraumbilical hernia. No fluid collection. Musculoskeletal: Degenerative changes of the spine and osteopenia. No acute osseous pathology. IMPRESSION: 1. Cardiomegaly with findings of CHF and small bilateral pleural effusions. 2. Morphologically changes of early cirrhosis, likely secondary to passive congestion. Small perihepatic ascites. 3. The visualized bowel appear unremarkable. 4.  Aortic Atherosclerosis (ICD10-I70.0). Electronically Signed   By: Anner Crete M.D.   On: 06/21/2018 03:48    Positive ROS: All other systems have been reviewed and were otherwise negative with the exception of those mentioned in the HPI and as above.  12 point ROS was  performed.  Physical Exam: General: Alert and oriented.  No apparent distress.  Vascular:  Left foot:Dorsalis Pedis:  absent Posterior Tibial:  absent  Right foot: Dorsalis Pedis:  absent Posterior Tibial:  absent  She has biphasic DP and PT pulses on Doppler examination.  Neuro:intact sensation with palpation  Derm: Right foot without ulceration. Left foot with full-thickness fibrotic ulceration that probes directly down to bone with scant amount of purulent drainage from the area.  Noted surrounding granulation tissue as well.  There is a dried hemorrhagic blister on the tip of the left fourth toe.  Also a small blister on the plantar aspect of the left forefoot.  Ortho/MS: She is guarded with attempted palpation range of motion of the left great toe region.  She demonstrated active dorsi and plantar flexion of the foot.  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  This does probe directly down to bone with noted infection and fibrotic tissue.  Suspect the infection is into the bone and with this I would recommend removal of the infected bone consistent with amputation of the great toe.  I had a long discussion with the patient regards to this.  We will plan to perform surgery tomorrow pending the results of the x-ray.  If the x-ray is negative we will likely order an MRI for further evaluation.  I had a long discussion regards the risks and benefits associated with surgery including nonhealing of the wound and need for further surgery.  Consent has been given verbally.  We will also consult vascular surgery evaluation of lower extremity circulation.    Elesa Hacker, DPM Cell [phone]   06/21/2018 5:30 PM   Addendum:  Odette Horns with obvious erosion and pathological fracture at site of ulcer consistent with osteomyelitis.  Will plan to add on surgery tomorrow. Will add surgical orders.

## 2018-06-21 NOTE — TOC Initial Note (Signed)
Transition of Care [facility]) - Initial/Assessment Note    Patient Details  Name: [name] MRN: [id number] Date of Birth: 1955/07/02  [facility]) CM/SW Contact:    Katrina Stack, RN Phone Number: 06/21/2018, 5:50 PM  Clinical Narrative:                It is difficult to keep conversation on topic with patient.  She does not answer question with specific information. Such as, when asked if shej had transportation to appointments, she says v"my husband is in a wheelchair- she never gives a specific yes or no. She does not give patient name of her primary physician. This CM found [facility] was on her [place] access card in August 2018- Dr Leonides Sake. A referral was made to [facility]. Patient does not give CM specific answered when questioned about meds, weights, CHF management. Has not kept Heart Failure Clinic.  Wonder if patient may benefit from Music therapist. Will investigate. has scales    Expected Discharge Plan: [place]     Patient Goals and CMS Choice Patient states their goals for this hospitalization and ongoing recovery are:: Feel better CMS Medicare.gov Compare Post Acute Care list provided to:: Patient(home health and placed in medical record) Choice offered to / list presented to : Patient  Expected Discharge Plan and Services Expected Discharge Plan: [place]   Discharge Planning Services: CM Consult                                          Prior Living Arrangements/Services     Patient language and need for interpreter reviewed:: Yes Do you feel safe going back to the place where you live?: Yes      Need for Family Participation in Patient Care: Yes (Comment) Care giver support system in place?: (unsure at present)   Criminal Activity/Legal Involvement Pertinent to Current Situation/Hospitalization: No - Comment as needed  Activities of Daily Living Home Assistive  Devices/Equipment: None ADL Screening (condition at time of admission) Patient's cognitive ability adequate to safely complete daily activities?: Yes Is the patient deaf or have difficulty hearing?: No Does the patient have difficulty seeing, even when wearing glasses/contacts?: No Does the patient have difficulty concentrating, remembering, or making decisions?: No Patient able to express need for assistance with ADLs?: Yes Does the patient have difficulty dressing or bathing?: Yes Independently performs ADLs?: No Communication: Independent Dressing (OT): Needs assistance Is this a change from baseline?: Pre-admission baseline Grooming: Independent Feeding: Independent Bathing: Needs assistance Is this a change from baseline?: Pre-admission baseline Toileting: Independent In/Out Bed: Needs assistance Is this a change from baseline?: Pre-admission baseline Walks in Home: Independent Does the patient have difficulty walking or climbing stairs?: No Weakness of Legs: Both Weakness of Arms/Hands: None  Permission Sought/Granted Permission sought to share information with : Case Manager, Customer service manager                Emotional Assessment Appearance:: Appears older than stated age Attitude/Demeanor/Rapport: Gracious Affect (typically observed): Accepting, Calm   Alcohol / Substance Use: Not Applicable Psych Involvement: No (comment)  Admission diagnosis:  Anemia, unspecified type [D64.9] Severe anemia [D64.9] Patient Active Problem List   Diagnosis Date Noted  . Severe anemia 06/20/2018  . CHF (congestive heart failure) ([place]) 01/13/2018  . B12 deficiency 12/07/2017  .  Iron deficiency anemia due to chronic blood loss 12/07/2017  . Folate deficiency 12/07/2017  . GIB (gastrointestinal bleeding) 11/03/2017  . COPD (chronic obstructive pulmonary disease) ([place]) 09/26/2017  . Symptomatic anemia 09/26/2017  . Acute on chronic systolic CHF (congestive heart  failure) ([place]) 08/27/2017  . Anemia   . Weakness   . GI bleed 08/07/2017  . Chronic systolic heart failure ([place]) 12/28/2016  . Diabetes ([place]) 12/28/2016  . Tobacco use 12/28/2016  . NSTEMI (non-ST elevated myocardial infarction) ([place])   . Sepsis ([place]) 12/18/2016  . CHF exacerbation ([place]) 12/04/2015  . PNA (pneumonia) 10/28/2015  . COPD exacerbation ([place]) 08/29/2015   PCP:  Patient, No Pcp Per Pharmacy:   [place], [place] [place] [address] Phone: [phone] Fax: [place], [place] [address] [place] [address] Phone: [phone] Fax: [phone]  [id number] [id number]: [phone]: [phone]    Social Determinants of Health (SDOH) Interventions    Readmission Risk Interventions Readmission Risk Prevention Plan 09/30/2017  Transportation Screening Complete  PCP or Specialist Appt within 5-7 Days Complete  Home Care Screening Complete  Medication Review (RN CM) Complete  Some recent data might be hidden

## 2018-06-21 NOTE — H&P (Signed)
[facility] [place] NAME: [name]    MR#:  [id number]  DATE OF BIRTH:  24-May-1955  DATE OF ADMISSION:  06/20/2018  PRIMARY CARE PHYSICIAN: Patient, No Pcp Per   REQUESTING/REFERRING PHYSICIAN: Nance Pear, MD  CHIEF COMPLAINT:   Chief Complaint  Patient presents with  . Hyperglycemia    HISTORY OF PRESENT ILLNESS:  [name]  is a 63 y.o. female with a known history of CHF, COPD, GI bleed September 2019, N STEMI, diabetes mellitus, history of cervical cancer.  She presented to the emergency room with complaint "I feel sick ".  She feels increased weakness and fatigue over the last month with elevated glucose levels usually in the 200s.  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  She has a history of debridement in April 2020.  However patient reports over the last month she has been changing the dressing on her own with the assistance of her husband.  Patient is unable to tell me whether the wound is worse or better since the debridement.  There is a foul odor with yellow drainage from the wound noted.  She has noted no hematemesis, hematochezia, or melena.  She has noted no black or tarry stools.  She denies fever, chills.  She denies shortness of breath or chest pain.  She denies abdominal pain.  She denies dizziness or falls.  She denies productive cough.  Rapid COVID-19 testing is negative.  On arrival hemoglobin is 3.8 with hematocrit 14.4.  Platelet count 179.  Lactic acid is 2.1.  WBC is 7.8.  Stool was positive for occult blood.  She has been admitted to the hospital service for further management. PAST MEDICAL HISTORY:   Past Medical History:  Diagnosis Date  . Cervical cancer ([place])   . CHF (congestive heart failure) ([place])   . COPD (chronic obstructive pulmonary disease) ([place])   . Diabetes mellitus without complication ([place])   . NSTEMI (non-ST elevated myocardial  infarction) ([place])     PAST SURGICAL HISTORY:   Past Surgical History:  Procedure Laterality Date  . APPENDECTOMY    . CARDIAC CATHETERIZATION    . CHOLECYSTECTOMY    . COLONOSCOPY N/A 09/28/2017   Procedure: COLONOSCOPY;  Surgeon: Toledo, Benay Pike, MD;  Location: [facility] ENDOSCOPY;  Service: Gastroenterology;  Laterality: N/A;  . ENTEROSCOPY Left 11/06/2017   Procedure: ENTEROSCOPY;  Surgeon: Jonathon Bellows, MD;  Location[facility] ENDOSCOPY;  Service: Gastroenterology;  Laterality: Left;  . ESOPHAGOGASTRODUODENOSCOPY N/A 12/19/2016   Procedure: ESOPHAGOGASTRODUODENOSCOPY (EGD);  Surgeon: Lin Landsman, MD;  Location: [facility] ENDOSCOPY;  Service: Gastroenterology;  Laterality: N/A;  . ESOPHAGOGASTRODUODENOSCOPY N/A 09/28/2017   Procedure: ESOPHAGOGASTRODUODENOSCOPY (EGD);  Surgeon: Toledo, Benay Pike, MD;  Location: [facility] ENDOSCOPY;  Service: Gastroenterology;  Laterality: N/A;  . ESOPHAGOGASTRODUODENOSCOPY (EGD) WITH PROPOFOL N/A 08/08/2017   Procedure: ESOPHAGOGASTRODUODENOSCOPY (EGD) WITH PROPOFOL;  Surgeon: [name], MD;  Location: [facility] ENDOSCOPY;  Service: Endoscopy;  Laterality: N/A;  . heart [place] in [place]    . LEFT HEART CATH AND CORONARY ANGIOGRAPHY N/A 12/23/2016   Procedure: LEFT HEART CATH AND CORONARY ANGIOGRAPHY;  Surgeon: Teodoro Spray, MD;  Location: [place] CV LAB;  Service: Cardiovascular;  Laterality: N/A;    SOCIAL HISTORY:   Social History   Tobacco Use  . Smoking status: Current Some Day Smoker    Packs/day: 0.25    Types: Cigarettes  . Smokeless  tobacco: Never Used  Substance Use Topics  . Alcohol use: No    FAMILY HISTORY:   Family History  Problem Relation Age of Onset  . Hypertension Mother   . Diabetes Mellitus II Mother   . Breast cancer Mother   . Heart disease Father   . Cancer Sister     DRUG ALLERGIES:   Allergies  Allergen Reactions  . Latex Anaphylaxis and Rash  . Gabapentin Nausea And Vomiting and Other (See Comments)     REVIEW OF SYSTEMS:   Review of Systems  Constitutional: Positive for malaise/fatigue. Negative for chills and fever.  HENT: Negative for congestion, sinus pain and sore throat.   Eyes: Negative for blurred vision and double vision.  Respiratory: Negative for cough, shortness of breath and wheezing.   Cardiovascular: Negative for chest pain, palpitations and leg swelling.  Gastrointestinal: Negative for abdominal pain, blood in stool, constipation, diarrhea, heartburn, melena, nausea and vomiting.  Genitourinary: Negative for dysuria, flank pain, frequency, hematuria and urgency.       Decreased urine output   Musculoskeletal: Negative for back pain, falls, joint pain, myalgias and neck pain.  Skin: Negative for itching and rash.  Neurological: Negative for dizziness, focal weakness, loss of consciousness and headaches.  Psychiatric/Behavioral: Negative.       MEDICATIONS AT HOME:   Prior to Admission medications   Medication Sig Start Date End Date Taking? Authorizing Provider  atorvastatin (LIPITOR) 40 MG tablet Take 1 tablet (40 mg total) by mouth daily. 01/16/18 05/16/18  Gladstone Lighter, MD  carvedilol (COREG) 3.125 MG tablet Take 1 tablet (3.125 mg total) by mouth 2 (two) times daily with a meal. 08/29/17   Dustin Flock, MD  ferrous sulfate 325 (65 FE) MG tablet Take 1 tablet (325 mg total) by mouth 2 (two) times daily with a meal. 09/30/17   Epifanio Lesches, MD  folic acid (FOLVITE) 1 MG tablet Take 1 tablet (1 mg total) by mouth daily. 09/30/17   Epifanio Lesches, MD  furosemide (LASIX) 40 MG tablet Take 1 tablet (40 mg total) by mouth daily. 01/16/18 01/16/19  Gladstone Lighter, MD  Insulin Glargine (LANTUS SOLOSTAR) 100 UNIT/ML Solostar Pen Inject 15 Units into the skin daily. Patient taking differently: Inject 16 Units into the skin daily.  08/29/17   Dustin Flock, MD  omeprazole (PRILOSEC) 40 MG capsule Take 1 capsule (40 mg total) by mouth daily. 11/06/17  11/06/18  Dustin Flock, MD  ondansetron (ZOFRAN) 4 MG tablet Take 1 tablet (4 mg total) by mouth every 6 (six) hours as needed for nausea. 08/29/17   Dustin Flock, MD  pregabalin (LYRICA) 100 MG capsule Take 1-2 capsules (100-200 mg total) by mouth See admin instructions. Take 1 capsule (100MG) by mouth every morning and 2 capsules (200MG) by mouth every night 01/16/18   Gladstone Lighter, MD  PROAIR HFA 108 [phone] Base) MCG/ACT inhaler Inhale 1-2 puffs into the lungs every 4 (four) hours as needed. 10/10/17   [provider]  SYMBICORT 160-4.5 MCG/ACT inhaler Inhale 2 puffs into the lungs 2 (two) times daily. 10/10/17   [provider]      VITAL SIGNS:  Blood pressure 129/60, pulse 91, temperature 98.4 F (36.9 C), temperature source Oral, resp. rate 18, height 5' 5"  (1.651 m), weight 74.8 kg, SpO2 100 %.  PHYSICAL EXAMINATION:  Physical Exam Constitutional:      General: She is not in acute distress.    Appearance: Normal appearance.  HENT:     Head:  Normocephalic and atraumatic.     Right Ear: External ear normal.     Left Ear: External ear normal.     Nose: Nose normal. No congestion or rhinorrhea.     Mouth/Throat:     Mouth: Mucous membranes are moist.     Pharynx: Oropharynx is clear.  Eyes:     General: No scleral icterus.    Conjunctiva/sclera: Conjunctivae normal.     Pupils: Pupils are equal, round, and reactive to light.  Neck:     Musculoskeletal: Normal range of motion and neck supple.  Cardiovascular:     Rate and Rhythm: Normal rate and regular rhythm.     Heart sounds: Normal heart sounds. No murmur. No friction rub. No gallop.   Pulmonary:     Effort: Pulmonary effort is normal. No respiratory distress.     Breath sounds: Normal breath sounds. No wheezing, rhonchi or rales.  Abdominal:     General: Bowel sounds are normal. There is no distension.     Palpations: Abdomen is soft.     Tenderness: There is no abdominal tenderness.   Musculoskeletal: Normal range of motion.        General: No swelling or tenderness.     Right lower leg: No edema.     Left lower leg: No edema.  Skin:    General: Skin is warm and dry.     Capillary Refill: Capillary refill takes less than 2 seconds.     Coloration: Skin is not jaundiced.     Findings: No rash.  Neurological:     General: No focal deficit present.     Mental Status: She is alert and oriented to person, place, and time.     Sensory: No sensory deficit.  Psychiatric:        Mood and Affect: Mood normal.        Behavior: Behavior normal.      LABORATORY PANEL:   CBC Recent Labs  Lab 06/20/18 2038  WBC 7.8  HGB 3.8*  HCT 14.4*  PLT 179   ------------------------------------------------------------------------------------------------------------------  Chemistries  Recent Labs  Lab 06/20/18 2038  NA 135  K 4.0  CL 110  CO2 17*  GLUCOSE 166*  BUN 26*  CREATININE 1.85*  CALCIUM 8.3*  AST 12*  ALT 8  ALKPHOS 94  BILITOT 0.4   ------------------------------------------------------------------------------------------------------------------  Cardiac Enzymes No results for input(s): TROPONINI in the last 168 hours. ------------------------------------------------------------------------------------------------------------------  RADIOLOGY:  No results found.    IMPRESSION AND PLAN:   1.  Severe anemia - Patient is receiving transfusion 3 units packed red blood cells. -We will repeat hemoglobin hematocrit posttransfusion and every 8 hours. - We will get stool for occult blood although digital guaiac test was positive faintly according to ED physician report - Consult gastroenterology for further evaluation as she does have a history of GI bleed September 2019. - We will admit to telemetry unit for close monitoring  2.  GI bleed - Patient is receiving 3 units packed red blood cells for severe anemia with hemoglobin 3.8 and hematocrit 14.4  on her arrival. - Gastroenterology, Dr. Alice Reichert, consulted for further evaluation and recommendations - Stool for occult blood - We will continue to transfuse as necessary - Started on Protonix infusion in the emergency room which has been continued  3.  Left great toe diabetic wound - With history of recent debridement April 2020 - We will consult wound care nurse for management - Blood cultures and wound culture pending -  Rocephin and Flagyl initiated  4. diabetes mellitus --Hemoglobin A1c - Sliding scale insulin - Lantus insulin continued -We will consult diabetic educator with patient experiencing persistent hyperglycemia with glucose in the 200s  DVT prophylaxis with SCDs and PPI prophylaxis initiated    All the records are reviewed and case discussed with ED provider. The plan of care was discussed in details with the patient (and family). I answered all questions. The patient agreed to proceed with the above mentioned plan. Further management will depend upon hospital course.   CODE STATUS: Full code  TOTAL TIME TAKING CARE OF THIS PATIENT:45 minutes.    [place] on 06/21/2018 at 12:01 AM  Pager - [phone]  After 6pm go to [email or web address] - Proofreader  [facility]  Office  [phone]  CC: Primary care physician; Patient, No Pcp Per   Note: This dictation was prepared with Dragon dictation along with smaller phrase technology. Any transcriptional errors that result from this process are unintentional.

## 2018-06-21 NOTE — H&P (Signed)
Patient seen and examined, agree with detailed note below. Patient presentation and plan discussed on rounds with APP.    To ED for weakness and found to have severe anemia with no melena or blood in stool. Has chronic anemia with EGD in 2019 showing gastritis. Baseline Hb 7.5. Has not been taking her iron tabs. No abd tenderness/dysphagia Sitting at side of bed and talking. Stable vitals.  * Chronic GI loss anemia Transfuse 3 units PRBC. Consult GI. IV Protonix

## 2018-06-21 NOTE — Progress Notes (Signed)
[place] at [facility]                                                                                                                                                                                  Patient Demographics   [name], is a 63 y.o. female, DOB - Mar 18, 1955, ZOX:[id number]  Admit date - 06/20/2018   Admitting Physician Hillary Bow, MD  Outpatient Primary MD for the patient is Patient, No Pcp Per   LOS - 1  Subjective: Patient known to me from previous admissions for recurrent anemia She is received 3 units of packed RBCs   Review of Systems:   CONSTITUTIONAL: No documented fever.  Positive for fatigue, positive weakness. No weight gain, no weight loss.  EYES: No blurry or double vision.  ENT: No tinnitus. No postnasal drip. No redness of the oropharynx.  RESPIRATORY: No cough, no wheeze, no hemoptysis. No dyspnea.  CARDIOVASCULAR: No chest pain. No orthopnea. No palpitations. No syncope.  GASTROINTESTINAL: No nausea, no vomiting or diarrhea. No abdominal pain. No melena or hematochezia.  GENITOURINARY: No dysuria or hematuria.  ENDOCRINE: No polyuria or nocturia. No heat or cold intolerance.  HEMATOLOGY: No anemia. No bruising. No bleeding.  INTEGUMENTARY: No rashes. No lesions.  MUSCULOSKELETAL: No arthritis. No swelling. No gout.  NEUROLOGIC: No numbness, tingling, or ataxia. No seizure-type activity.  PSYCHIATRIC: No anxiety. No insomnia. No ADD.    Vitals:   Vitals:   06/21/18 0606 06/21/18 0822 06/21/18 0839 06/21/18 0843  BP: 140/84 135/73 132/70 132/70  Pulse: 89 87 86 87  Resp:  19    Temp: 98.3 F (36.8 C)   98 F (36.7 C)  TempSrc: Oral     SpO2: 93% 98% 98% 98%  Weight:      Height:        Wt Readings from Last 3 Encounters:  06/20/18 74.8 kg  01/16/18 88.9 kg  12/07/17 84 kg     Intake/Output Summary (Last 24 hours) at 06/21/2018 1245 Last data filed at 06/21/2018 1031 Gross per 24 hour  Intake 1281 ml   Output 1600 ml  Net -319 ml    Physical Exam:   GENERAL: Pleasant-appearing in no apparent distress.  HEAD, EYES, EARS, NOSE AND THROAT: Atraumatic, normocephalic. Extraocular muscles are intact. Pupils equal and reactive to light. Sclerae anicteric. No conjunctival injection. No oro-pharyngeal erythema.  NECK: Supple. There is no jugular venous distention. No bruits, no lymphadenopathy, no thyromegaly.  HEART: Regular rate and rhythm,. No murmurs, no rubs, no clicks.  LUNGS: Clear to auscultation bilaterally. No rales or rhonchi. No wheezes.  ABDOMEN: Soft, flat, nontender, nondistended. Has good bowel sounds.  No hepatosplenomegaly appreciated.  EXTREMITIES: No evidence of any cyanosis, clubbing, or peripheral edema.  +2 pedal and radial pulses bilaterally.  NEUROLOGIC: The patient is alert, awake, and oriented x3 with no focal motor or sensory deficits appreciated bilaterally.  SKIN: Moist and warm with no rashes appreciated.  Psych: Not anxious, depressed LN: No inguinal LN enlargement    Antibiotics   Anti-infectives (From admission, onward)   Start     Dose/Rate Route Frequency Ordered Stop   06/20/18 2315  cefTRIAXone (ROCEPHIN) 1 g in sodium chloride 0.9 % 100 mL IVPB     1 g 200 mL/hr over 30 Minutes Intravenous Every 24 hours 06/20/18 2312     06/20/18 2315  metroNIDAZOLE (FLAGYL) IVPB 500 mg     500 mg 100 mL/hr over 60 Minutes Intravenous Every 8 hours 06/20/18 2312        Medications   Scheduled Meds: . atorvastatin  40 mg Oral Daily  . carvedilol  3.125 mg Oral BID WC  . folic acid  1 mg Oral Daily  . furosemide  40 mg Oral Daily  . insulin aspart  0-5 Units Subcutaneous QHS  . insulin glargine  15 Units Subcutaneous Daily  . mometasone-formoterol  2 puff Inhalation BID  . multivitamin with minerals  1 tablet Oral Daily  . [START ON 06/24/2018] pantoprazole  40 mg Intravenous Q12H  . sodium chloride flush  3 mL Intravenous Q12H  . thiamine  100 mg Oral Daily    Continuous Infusions: . sodium chloride    . sodium chloride Stopped (06/21/18 1001)  . sodium chloride    . cefTRIAXone (ROCEPHIN)  IV 1 g (06/21/18 0400)  . [START ON 06/22/2018] ferumoxytol    . metronidazole 500 mg (06/21/18 0240)  . pantoprozole (PROTONIX) infusion 8 mg/hr (06/20/18 2205)   PRN Meds:.sodium chloride, acetaminophen **OR** acetaminophen, polyethylene glycol, sodium chloride flush   Data Review:   Micro Results Recent Results (from the past 240 hour(s))  SARS Coronavirus 2 (CEPHEID- Performed in [place] hospital lab), Hosp Order     Status: None   Collection Time: 06/20/18  9:40 PM  Result Value Ref Range Status   SARS Coronavirus 2 NEGATIVE NEGATIVE Final    Comment: (NOTE) If result is NEGATIVE SARS-CoV-2 target nucleic acids are NOT DETECTED. The SARS-CoV-2 RNA is generally detectable in upper and lower  respiratory specimens during the acute phase of infection. The lowest  concentration of SARS-CoV-2 viral copies this assay can detect is 250  copies / mL. A negative result does not preclude SARS-CoV-2 infection  and should not be used as the sole basis for treatment or other  patient management decisions.  A negative result may occur with  improper specimen collection / handling, submission of specimen other  than nasopharyngeal swab, presence of viral mutation(s) within the  areas targeted by this assay, and inadequate number of viral copies  (<250 copies / mL). A negative result must be combined with clinical  observations, patient history, and epidemiological information. If result is POSITIVE SARS-CoV-2 target nucleic acids are DETECTED. The SARS-CoV-2 RNA is generally detectable in upper and lower  respiratory specimens dur ing the acute phase of infection.  Positive  results are indicative of active infection with SARS-CoV-2.  Clinical  correlation with patient history and other diagnostic information is  necessary to determine patient  infection status.  Positive results do  not rule out bacterial infection or co-infection with other viruses. If result is PRESUMPTIVE POSTIVE SARS-CoV-2  nucleic acids MAY BE PRESENT.   A presumptive positive result was obtained on the submitted specimen  and confirmed on repeat testing.  While 2019 novel coronavirus  (SARS-CoV-2) nucleic acids may be present in the submitted sample  additional confirmatory testing may be necessary for epidemiological  and / or clinical management purposes  to differentiate between  SARS-CoV-2 and other Sarbecovirus currently known to infect humans.  If clinically indicated additional testing with an alternate test  methodology [phone]) is advised. The SARS-CoV-2 RNA is generally  detectable in upper and lower respiratory sp ecimens during the acute  phase of infection. The expected result is Negative. Fact Sheet for Patients:  StrictlyIdeas.no Fact Sheet for Healthcare Providers: BankingDealers.co.za This test is not yet approved or cleared by the [place] FDA and has been authorized for detection and/or diagnosis of SARS-CoV-2 by FDA under an Emergency Use Authorization (EUA).  This EUA will remain in effect (meaning this test can be used) for the duration of the COVID-19 declaration under Section 564(b)(1) of the Act, 21 U.S.C. section 360bbb-3(b)(1), unless the authorization is terminated or revoked sooner. Performed at [facility], [place], [place] [address]   CULTURE, BLOOD (ROUTINE X 2) w Reflex to ID Panel     Status: None (Preliminary result)   Collection Time: 06/21/18  2:17 AM  Result Value Ref Range Status   Specimen Description BLOOD BLOOD RIGHT HAND  Final   Special Requests   Final    BOTTLES DRAWN AEROBIC AND ANAEROBIC Blood Culture adequate volume   Culture   Final    NO GROWTH < 12 HOURS Performed at [facility], [address],  [place], [place] [address]    Report Status PENDING  Incomplete    Radiology Reports Ct Abdomen Wo Contrast  Result Date: 06/21/2018 CLINICAL DATA:  62 year old female with abdominal pain. Concern for gastroenteritis or colitis. EXAM: CT ABDOMEN WITHOUT CONTRAST TECHNIQUE: Multidetector CT imaging of the abdomen was performed following the standard protocol without IV contrast. COMPARISON:  CT of the abdomen pelvis dated 03/12/2012 FINDINGS: Evaluation of this exam is limited in the absence of intravenous contrast. Lower chest: There is cardiomegaly with diffuse interstitial and interlobular septal prominence consistent with edema. Small bilateral pleural effusions noted. There is calcification of the mitral annulus. There is hypoattenuation of the cardiac blood pool suggestive of a degree of anemia. Clinical correlation is recommended. No free air noted in the visualized abdomen. There is a small perihepatic ascites. Hepatobiliary: Slight irregularity of the liver contour suggestive of early cirrhosis, likely related to passive congestion and heart failure. Cholecystectomy. Pancreas: Unremarkable. No pancreatic ductal dilatation or surrounding inflammatory changes. Spleen: Normal in size without focal abnormality. Adrenals/Urinary Tract: The adrenal glands are unremarkable. There is no hydronephrosis on either side. Renal vascular calcifications noted. There is a 6 cm left renal inferior pole exophytic cysts. Additional smaller left renal hypodense lesions are not characterized but most likely represent cysts. The visualized proximal ureters are unremarkable. Stomach/Bowel: There is moderate stool in the visualized colon. No dilated bowel noted in the abdomen. Please note evaluation of the bowel is limited as the distal bowel in the lower abdomen and pelvis are not included in the CT of the abdomen. Vascular/Lymphatic: There is moderate atherosclerotic calcification of the aorta. No portal venous gas. There is no  adenopathy. Other: Diffuse subcutaneous edema. Small fat containing supraumbilical hernia. No fluid collection. Musculoskeletal: Degenerative changes of the spine and osteopenia. No acute osseous pathology.  IMPRESSION: 1. Cardiomegaly with findings of CHF and small bilateral pleural effusions. 2. Morphologically changes of early cirrhosis, likely secondary to passive congestion. Small perihepatic ascites. 3. The visualized bowel appear unremarkable. 4.  Aortic Atherosclerosis (ICD10-I70.0). Electronically Signed   By: Anner Crete M.D.   On: 06/21/2018 03:48     CBC Recent Labs  Lab 06/20/18 2038  WBC 7.8  HGB 3.8*  HCT 14.4*  PLT 179  MCV 73.5*  MCH 19.4*  MCHC 26.4*  RDW 20.8*  LYMPHSABS 1.7  MONOABS 0.9  EOSABS 0.1  BASOSABS 0.0    Chemistries  Recent Labs  Lab 06/20/18 2038  NA 135  K 4.0  CL 110  CO2 17*  GLUCOSE 166*  BUN 26*  CREATININE 1.85*  CALCIUM 8.3*  AST 12*  ALT 8  ALKPHOS 94  BILITOT 0.4   ------------------------------------------------------------------------------------------------------------------ estimated creatinine clearance is 31.5 mL/min (A) (by C-G formula based on SCr of 1.85 mg/dL (H)). ------------------------------------------------------------------------------------------------------------------ Recent Labs    06/21/18 0217  HGBA1C 5.9*   ------------------------------------------------------------------------------------------------------------------ No results for input(s): CHOL, HDL, LDLCALC, TRIG, CHOLHDL, LDLDIRECT in the last 72 hours. ------------------------------------------------------------------------------------------------------------------ Recent Labs    06/21/18 0217  TSH 1.964   ------------------------------------------------------------------------------------------------------------------ Recent Labs    06/20/18 2256  TIBC 336  IRON 10*    Coagulation profile No results for input(s): INR, PROTIME  in the last 168 hours.  No results for input(s): DDIMER in the last 72 hours.  Cardiac Enzymes No results for input(s): CKMB, TROPONINI, MYOGLOBIN in the last 168 hours.  Invalid input(s): CK ------------------------------------------------------------------------------------------------------------------ Invalid input(s): [place]    1.  Severe anemia - GI to see -Patient with history of similar presentation in the past -Iron is low but no ferritin ordered will likely need IV iron therapy -Repeat CBC now transfuse if hemoglobin less than 7     2.    Possible GI bleed - No blood in the stool noted -Continue Protonix   3.  Left great toe diabetic wound - With history of recent debridement April 2020 - Continue IV antibiotic -Wound care recommends podiatry consult   4. diabetes mellitus --Hemoglobin A1c - Sliding scale insulin - Lantus insulin continued   DVT prophylaxis with SCDs and PPI prophylaxis initiated     Code Status Orders  (From admission, onward)         Start     Ordered   06/20/18 2301  Full code  Continuous     06/20/18 2312        Code Status History    Date Active Date Inactive Code Status Order ID Comments User Context   01/13/2018 1540 01/16/2018 1859 Full Code [id number]  Loletha Grayer, MD ED   11/03/2017 1758 11/06/2017 2136 Full Code [id number]  [name], MD Inpatient   09/26/2017 2353 09/30/2017 1404 Full Code [id number]  Lance Coon, MD Inpatient   08/27/2017 0347 08/29/2017 2054 Full Code [id number]  Harrie Foreman, MD Inpatient   08/07/2017 1704 08/09/2017 2237 Full Code [id number]  Saundra Shelling, MD Inpatient   12/18/2016 1146 12/26/2016 2032 Full Code [id number]  Vaughan Basta, MD Inpatient   12/18/2016 0959 12/18/2016 1146 Full Code [id number]  Vaughan Basta, MD ED   12/04/2015 0509 12/07/2015 1756 Full Code [id number]  Holley Raring, NP ED   10/28/2015 1640 10/29/2015 1711  Full Code [id number]  Bettey Costa, MD Inpatient   08/29/2015 1438 08/30/2015 1750 Full Code [id number]  Hugelmeyer, Ubaldo Glassing, DO Inpatient  Consultsgi  DVT Prophylaxis  Lovenox  Lab Results  Component Value Date   PLT 179 06/20/2018     Time Spent in minutes  2mn Greater than 50% of time spent in care coordination and counseling patient regarding the condition and plan of care.   SDustin FlockM.D on 06/21/2018 at 12:45 PM  Between 7am to 6pm - Pager - [phone]  After 6pm go to [email or web address] - pProofreader [employer]   Office  [phone]

## 2018-06-21 NOTE — Consult Note (Signed)
[name], MD [address]  [place]  [place], [place] [address]  Main: [phone]  Fax: [phone] Pager: [phone]   Consultation  Referring Provider:     No ref. provider found Primary Care Physician:  Patient, No Pcp Per Primary Gastroenterologist:  Dr. Bonna Gains         Reason for Consultation:     Severe anemia  Date of Admission:  06/20/2018 Date of Consultation:  06/21/2018         HPI:   [name] is a 63 y.o. 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  Patient is well-known to GI service, underwent EGD, colonoscopy,.  Capsule endoscopy, small bowel enteroscopy last year with treatment of gastric and small bowel AVMs.  Patient reports that for the last couple of weeks, she has been feeling well, with severe pain in her left great toe, she has not been taking oral iron as well.  She received IV iron as outpatient few months ago and her ferritin levels were normal.  She reported significant fatigue, generalized weakness and was found to have severe anemia, hemoglobin 3.8 in the ER.  She received multiple blood transfusions which responded appropriately.  Her BUN/creatinine are at baseline. Patient denied having any black stools, rectal bleeding at home.  She is evaluated by orthopedic for her left foot, confirmed to have infection in her left great toe with obvious erosion and pathological fracture at the site of culture consistent with osteomyelitis.  They are planning to perform surgery on her tomorrow.   NSAIDs: None  Antiplts/Anticoagulants/Anti thrombotics: None  GI Procedures: Refer to procedures tab, patient underwent several endoscopic procedures in the past  Past Medical History:  Diagnosis Date  . Cervical cancer ([place])   . CHF (congestive heart failure) ([place])   . COPD (chronic obstructive pulmonary disease) ([place])   . Diabetes mellitus without complication ([place])    . NSTEMI (non-ST elevated myocardial infarction) [facility])     Past Surgical History:  Procedure Laterality Date  . APPENDECTOMY    . CARDIAC CATHETERIZATION    . CHOLECYSTECTOMY    . COLONOSCOPY N/A 09/28/2017   Procedure: COLONOSCOPY;  Surgeon: Toledo, Benay Pike, MD;  Location: [facility] ENDOSCOPY;  Service: Gastroenterology;  Laterality: N/A;  . ENTEROSCOPY Left 11/06/2017   Procedure: ENTEROSCOPY;  Surgeon: Jonathon Bellows, MD;  Location: [facility] ENDOSCOPY;  Service: Gastroenterology;  Laterality: Left;  . ESOPHAGOGASTRODUODENOSCOPY N/A 12/19/2016   Procedure: ESOPHAGOGASTRODUODENOSCOPY (EGD);  Surgeon: Lin Landsman, MD;  Location[facility] ENDOSCOPY;  Service: Gastroenterology;  Laterality: N/A;  . ESOPHAGOGASTRODUODENOSCOPY N/A 09/28/2017   Procedure: ESOPHAGOGASTRODUODENOSCOPY (EGD);  Surgeon: Toledo, Benay Pike, MD;  Location: [facility] ENDOSCOPY;  Service: Gastroenterology;  Laterality: N/A;  . ESOPHAGOGASTRODUODENOSCOPY (EGD) WITH PROPOFOL N/A 08/08/2017   Procedure: ESOPHAGOGASTRODUODENOSCOPY (EGD) WITH PROPOFOL;  Surgeon: [name], MD;  Location: [facility] ENDOSCOPY;  Service: Endoscopy;  Laterality: N/A;  . [place]    . LEFT HEART CATH AND CORONARY ANGIOGRAPHY N/A 12/23/2016   Procedure: LEFT HEART CATH AND CORONARY ANGIOGRAPHY;  Surgeon: Teodoro Spray, MD;  Location: [place] CV LAB;  Service: Cardiovascular;  Laterality: N/A;    Prior to Admission medications   Medication Sig Start Date End Date Taking? Authorizing Provider  atorvastatin (LIPITOR) 40 MG tablet Take 1 tablet (40 mg total) by mouth daily. 01/16/18 05/16/18  Gladstone Lighter, MD  carvedilol (COREG) 3.125 MG tablet Take 1  tablet (3.125 mg total) by mouth 2 (two) times daily with a meal. 08/29/17   Dustin Flock, MD  ferrous sulfate 325 (65 FE) MG tablet Take 1 tablet (325 mg total) by mouth 2 (two) times daily with a meal. 09/30/17   Epifanio Lesches, MD  folic acid (FOLVITE) 1 MG tablet Take 1  tablet (1 mg total) by mouth daily. 09/30/17   Epifanio Lesches, MD  furosemide (LASIX) 40 MG tablet Take 1 tablet (40 mg total) by mouth daily. 01/16/18 01/16/19  Gladstone Lighter, MD  Insulin Glargine (LANTUS SOLOSTAR) 100 UNIT/ML Solostar Pen Inject 15 Units into the skin daily. Patient taking differently: Inject 16 Units into the skin daily.  08/29/17   Dustin Flock, MD  omeprazole (PRILOSEC) 40 MG capsule Take 1 capsule (40 mg total) by mouth daily. 11/06/17 11/06/18  Dustin Flock, MD  ondansetron (ZOFRAN) 4 MG tablet Take 1 tablet (4 mg total) by mouth every 6 (six) hours as needed for nausea. 08/29/17   Dustin Flock, MD  pregabalin (LYRICA) 100 MG capsule Take 1-2 capsules (100-200 mg total) by mouth See admin instructions. Take 1 capsule (100MG) by mouth every morning and 2 capsules (200MG) by mouth every night 01/16/18   Gladstone Lighter, MD  PROAIR HFA 108 [phone] Base) MCG/ACT inhaler Inhale 1-2 puffs into the lungs every 4 (four) hours as needed. 10/10/17   [provider]  SYMBICORT 160-4.5 MCG/ACT inhaler Inhale 2 puffs into the lungs 2 (two) times daily. 10/10/17   [provider]    Current Facility-Administered Medications:  .  0.9 %  sodium chloride infusion, 10 mL/hr, Intravenous, Once, Nance Pear, MD .  0.9 %  sodium chloride infusion, , Intravenous, Once, Seals, Theo Dills, NP, Stopped at 06/21/18 1001 .  0.9 %  sodium chloride infusion, 250 mL, Intravenous, PRN, Seals, Levada Dy H, NP .  acetaminophen (TYLENOL) tablet 650 mg, 650 mg, Oral, Q6H PRN **OR** acetaminophen (TYLENOL) suppository 650 mg, 650 mg, Rectal, Q6H PRN, Seals, Angela H, NP .  atorvastatin (LIPITOR) tablet 40 mg, 40 mg, Oral, Daily, Seals, Angela H, NP, 40 mg at 06/21/18 1822 .  carvedilol (COREG) tablet 3.125 mg, 3.125 mg, Oral, BID WC, Seals, Angela H, NP, 3.125 mg at 06/21/18 1822 .  cefTRIAXone (ROCEPHIN) 1 g in sodium chloride 0.9 % 100 mL IVPB, 1 g, Intravenous, Q24H, Seals,  Angela H, NP, Last Rate: 200 mL/hr at 06/21/18 0400, 1 g at 06/21/18 0400 .  [START ON 06/22/2018] ferumoxytol (FERAHEME) 510 mg in sodium chloride 0.9 % 100 mL IVPB, 510 mg, Intravenous, Once, Ayoub Arey, Tally Due, MD .  folic acid (FOLVITE) tablet 1 mg, 1 mg, Oral, Daily, Seals, Angela H, NP, 1 mg at 06/21/18 1014 .  furosemide (LASIX) tablet 40 mg, 40 mg, Oral, Daily, Seals, Angela H, NP, 40 mg at 06/21/18 1014 .  insulin aspart (novoLOG) injection 0-5 Units, 0-5 Units, Subcutaneous, QHS, Seals, Angela H, NP .  insulin glargine (LANTUS) injection 15 Units, 15 Units, Subcutaneous, Daily, Seals, Conover H, NP, 15 Units at 06/21/18 1014 .  metroNIDAZOLE (FLAGYL) IVPB 500 mg, 500 mg, Intravenous, Q8H, Seals, Angela H, NP, Last Rate: 100 mL/hr at 06/21/18 1306, 500 mg at 06/21/18 1306 .  mometasone-formoterol (DULERA) 200-5 MCG/ACT inhaler 2 puff, 2 puff, Inhalation, BID, Seals, Theo Dills, NP, 2 puff at 06/21/18 1015 .  multivitamin with minerals tablet 1 tablet, 1 tablet, Oral, Daily, Seals, Theo Dills, NP, 1 tablet at 06/21/18 1014 .  pantoprazole (PROTONIX) 80 mg in  sodium chloride 0.9 % 250 mL (0.32 mg/mL) infusion, 8 mg/hr, Intravenous, Continuous, Nance Pear, MD, Last Rate: 25 mL/hr at 06/21/18 1307, 8 mg/hr at 06/21/18 1307 .  [START ON 06/24/2018] pantoprazole (PROTONIX) injection 40 mg, 40 mg, Intravenous, Q12H, Nance Pear, MD .  polyethylene glycol (MIRALAX / GLYCOLAX) packet 17 g, 17 g, Oral, Daily PRN, Seals, Angela H, NP .  sodium chloride flush (NS) 0.9 % injection 10-40 mL, 10-40 mL, Intracatheter, Q12H, Dustin Flock, MD .  sodium chloride flush (NS) 0.9 % injection 10-40 mL, 10-40 mL, Intracatheter, PRN, Dustin Flock, MD .  sodium chloride flush (NS) 0.9 % injection 3 mL, 3 mL, Intravenous, Q12H, Seals, Angela H, NP .  sodium chloride flush (NS) 0.9 % injection 3 mL, 3 mL, Intravenous, PRN, Seals, Angela H, NP .  thiamine (VITAMIN B-1) tablet 100 mg, 100 mg, Oral, Daily, Seals,  Angela H, NP, 100 mg at 06/21/18 1014  Family History  Problem Relation Age of Onset  . Hypertension Mother   . Diabetes Mellitus II Mother   . Breast cancer Mother   . Heart disease Father   . Cancer Sister      Social History   Tobacco Use  . Smoking status: Current Some Day Smoker    Packs/day: 0.25    Types: Cigarettes  . Smokeless tobacco: Never Used  Substance Use Topics  . Alcohol use: No  . Drug use: No    Allergies as of 06/20/2018 - Review Complete 06/20/2018  Allergen Reaction Noted  . Latex Anaphylaxis and Rash 11/07/2012  . Gabapentin Nausea And Vomiting and Other (See Comments) 12/20/2012    Review of Systems:    All systems reviewed and negative except where noted in HPI.   Physical Exam:  Vital signs in last 24 hours: Temp:  [98 F (36.7 C)-99.5 F (37.5 C)] 98.4 F (36.9 C) (06/04 1821) Pulse Rate:  [80-96] 96 (06/04 1829) Resp:  [16-20] 20 (06/04 1821) BP: (123-160)/(55-90) 140/69 (06/04 1829) SpO2:  [93 %-100 %] 98 % (06/04 1821) Weight:  [74.8 kg] 74.8 kg (06/03 2031) Last BM Date: 06/19/18 General:   Pleasant, cooperative in NAD Head:  Normocephalic and atraumatic. Eyes:   No icterus.   Conjunctiva pale, PERRLA. Ears:  Normal auditory acuity. Neck:  Supple; no masses or thyroidomegaly Lungs: Respirations even and unlabored. Lungs clear to auscultation bilaterally.   No wheezes, crackles, or rhonchi.  Heart:  Regular rate and rhythm;  Without murmur, clicks, rubs or gallops Abdomen:  Soft, nondistended, nontender. Normal bowel sounds. No appreciable masses or hepatomegaly.  No rebound or guarding.  Rectal:  Not performed. Msk:  Symmetrical without gross deformities.  Generalized weakness Extremities:  Without edema, cyanosis or clubbing. Neurologic:  Alert and oriented x3;  grossly normal neurologically. Skin:  Intact without significant lesions or rashes. Psych:  Alert and cooperative. Normal affect.  LAB RESULTS: CBC Latest Ref Rng &  Units 06/21/2018 06/21/2018 06/20/2018  WBC 4.0 - 10.5 K/uL 7.8 7.5 7.8  Hemoglobin 12.0 - 15.0 g/dL 7.3(L) 8.2(L) 3.8(LL)  Hematocrit 36.0 - 46.0 % 24.2(L) 27.2(L) 14.4(LL)  Platelets 150 - 400 K/uL 183 183 179    BMET BMP Latest Ref Rng & Units 06/21/2018 06/20/2018 01/16/2018  Glucose 70 - 99 mg/dL 120(H) 166(H) 120(H)  BUN 8 - 23 mg/dL 24(H) 26(H) 39(H)  Creatinine 0.44 - 1.00 mg/dL 1.75(H) 1.85(H) 1.83(H)  Sodium 135 - 145 mmol/L 134(L) 135 134(L)  Potassium 3.5 - 5.1 mmol/L 4.1 4.0 5.2(H)  Chloride 98 - 111 mmol/L 105 110 101  CO2 22 - 32 mmol/L 18(L) 17(L) 26  Calcium 8.9 - 10.3 mg/dL 8.4(L) 8.3(L) 8.2(L)    LFT Hepatic Function Latest Ref Rng & Units 06/20/2018 08/26/2017 12/18/2016  Total Protein 6.5 - 8.1 g/dL 7.8 8.1 7.6  Albumin 3.5 - 5.0 g/dL 3.0(L) 3.5 2.9(L)  AST 15 - 41 U/L 12(L) 15 24  ALT 0 - 44 U/L 8 8 10(L)  Alk Phosphatase 38 - 126 U/L 94 98 106  Total Bilirubin 0.3 - 1.2 mg/dL 0.4 0.6 0.4     STUDIES: Ct Abdomen Wo Contrast  Result Date: 06/21/2018 CLINICAL DATA:  63 year old female with abdominal pain. Concern for gastroenteritis or colitis. EXAM: CT ABDOMEN WITHOUT CONTRAST TECHNIQUE: Multidetector CT imaging of the abdomen was performed following the standard protocol without IV contrast. COMPARISON:  CT of the abdomen pelvis dated 03/12/2012 FINDINGS: Evaluation of this exam is limited in the absence of intravenous contrast. Lower chest: There is cardiomegaly with diffuse interstitial and interlobular septal prominence consistent with edema. Small bilateral pleural effusions noted. There is calcification of the mitral annulus. There is hypoattenuation of the cardiac blood pool suggestive of a degree of anemia. Clinical correlation is recommended. No free air noted in the visualized abdomen. There is a small perihepatic ascites. Hepatobiliary: Slight irregularity of the liver contour suggestive of early cirrhosis, likely related to passive congestion and heart failure.  Cholecystectomy. Pancreas: Unremarkable. No pancreatic ductal dilatation or surrounding inflammatory changes. Spleen: Normal in size without focal abnormality. Adrenals/Urinary Tract: The adrenal glands are unremarkable. There is no hydronephrosis on either side. Renal vascular calcifications noted. There is a 6 cm left renal inferior pole exophytic cysts. Additional smaller left renal hypodense lesions are not characterized but most likely represent cysts. The visualized proximal ureters are unremarkable. Stomach/Bowel: There is moderate stool in the visualized colon. No dilated bowel noted in the abdomen. Please note evaluation of the bowel is limited as the distal bowel in the lower abdomen and pelvis are not included in the CT of the abdomen. Vascular/Lymphatic: There is moderate atherosclerotic calcification of the aorta. No portal venous gas. There is no adenopathy. Other: Diffuse subcutaneous edema. Small fat containing supraumbilical hernia. No fluid collection. Musculoskeletal: Degenerative changes of the spine and osteopenia. No acute osseous pathology. IMPRESSION: 1. Cardiomegaly with findings of CHF and small bilateral pleural effusions. 2. Morphologically changes of early cirrhosis, likely secondary to passive congestion. Small perihepatic ascites. 3. The visualized bowel appear unremarkable. 4.  Aortic Atherosclerosis (ICD10-I70.0). Electronically Signed   By: Anner Crete M.D.   On: 06/21/2018 03:48      Impression / Plan:   [name] is a 63 y.o. female with history of diabetes, iron deficiency anemia secondary to gastric and small bowel AVMs presents with left foot great toe osteomyelitis and severe symptomatic anemia.  She probably has chronic blood loss secondary to bleeding from small bowel AVMs.  Recommend small bowel enteroscopy.  Patient is not actively bleeding at this time.  Will tentatively plan to perform the procedure tomorrow depending on the timing of surgery by  orthopedics.  Continue Protonix, okay with liquid diet N.p.o. past midnight Close follow-up with hematology as outpatient for parenteral iron therapy  Thank you for involving me in the care of this patient.      LOS: 1 day   Sherri Sear, MD  06/21/2018, 7:19 PM   Note: This dictation was prepared with Dragon dictation along with smaller phrase technology.  Any transcriptional errors that result from this process are unintentional.

## 2018-06-21 NOTE — Progress Notes (Signed)
Dr. Vickki Muff at bedside assessing patient's left great toe, MD dressed the wound and he say to leave that dressing. He will also order xray of that foot. RN will continue to monitor.

## 2018-06-21 NOTE — Progress Notes (Signed)
Inpatient Diabetes Program Recommendations  AACE/ADA: New Consensus Statement on Inpatient Glycemic Control   Target Ranges:  Prepandial:   less than 140 mg/dL      Peak postprandial:   less than 180 mg/dL (1-2 hours)      Critically ill patients:  140 - 180 mg/dL   Results for MILEA, KLINK (MRN [id number]) as of 06/21/2018 08:20  Ref. Range 06/20/2018 20:35 06/21/2018 01:40  Glucose-Capillary Latest Ref Range: 70 - 99 mg/dL 148 (H) 115 (H)   Review of Glycemic Control  Diabetes history: DM2 Outpatient Diabetes medications: Lantus 16 units daily Current orders for Inpatient glycemic control: Lantus 15 units daily, Novolog 0-5 units QHS  Inpatient Diabetes Program Recommendations:   Correction (SSI): Please consider ordering Novolog 0-9 units TID with meals.  HgbA1C: A1C 6.5% on 05/10/18 in [place] indicating an average glucose of 140 mg/dl over the past 2-3 months.  NOTE: Noted consult for Diabetes Coordinator. Chart reviewed. Initial glucose 166 mg/dl at 20:38 on 06/20/18 and CBG 115 mg/dl today at 1:40 am. Per Care Everywhere patient's last A1C was 6.5% on 05/10/18. Will sign off consult. Please reconsult if needed.  Thanks, Barnie Alderman, RN, MSN, CDE Diabetes Coordinator Inpatient Diabetes Program [phone] (Team Pager from 8am to 5pm)

## 2018-06-22 ENCOUNTER — Other Ambulatory Visit (INDEPENDENT_AMBULATORY_CARE_PROVIDER_SITE_OTHER): Payer: Self-pay | Admitting: Vascular Surgery

## 2018-06-22 ENCOUNTER — Encounter
Admission: EM | Disposition: A | Discharge status: Home Health | Payer: Self-pay | Source: Home / Self Care | Attending: Internal Medicine

## 2018-06-22 ENCOUNTER — Encounter: Payer: Self-pay | Admitting: Gastroenterology

## 2018-06-22 ENCOUNTER — Inpatient Hospital Stay: Payer: Medicaid Other | Admitting: Anesthesiology

## 2018-06-22 DIAGNOSIS — E119 Type 2 diabetes mellitus without complications: Secondary | ICD-10-CM

## 2018-06-22 DIAGNOSIS — F17210 Nicotine dependence, cigarettes, uncomplicated: Secondary | ICD-10-CM

## 2018-06-22 DIAGNOSIS — K922 Gastrointestinal hemorrhage, unspecified: Secondary | ICD-10-CM

## 2018-06-22 DIAGNOSIS — K254 Chronic or unspecified gastric ulcer with hemorrhage: Principal | ICD-10-CM

## 2018-06-22 HISTORY — PX: ENTEROSCOPY: SHX5533

## 2018-06-22 LAB — PREPARE RBC (CROSSMATCH)

## 2018-06-22 LAB — HEMOGLOBIN AND HEMATOCRIT, BLOOD
HCT: 23.7 % — ABNORMAL LOW (ref 36.0–46.0)
HCT: 24.7 % — ABNORMAL LOW (ref 36.0–46.0)
HCT: 26.4 % — ABNORMAL LOW (ref 36.0–46.0)
Hemoglobin: 7.2 g/dL — ABNORMAL LOW (ref 12.0–15.0)
Hemoglobin: 7.5 g/dL — ABNORMAL LOW (ref 12.0–15.0)
Hemoglobin: 8.1 g/dL — ABNORMAL LOW (ref 12.0–15.0)

## 2018-06-22 LAB — GLUCOSE, CAPILLARY
Glucose-Capillary: 122 mg/dL — ABNORMAL HIGH (ref 70–99)
Glucose-Capillary: 157 mg/dL — ABNORMAL HIGH (ref 70–99)
Glucose-Capillary: 131 mg/dL — ABNORMAL HIGH (ref 70–99)

## 2018-06-22 LAB — MRSA PCR SCREENING: MRSA by PCR: POSITIVE — AB

## 2018-06-22 SURGERY — ENTEROSCOPY
Anesthesia: General

## 2018-06-22 SURGERY — COLONOSCOPY
Anesthesia: General

## 2018-06-22 MED ORDER — PROPOFOL 500 MG/50ML IV EMUL
INTRAVENOUS | Status: AC
  Filled 2018-06-22: qty 200

## 2018-06-22 MED ORDER — CHLORHEXIDINE GLUCONATE CLOTH 2 % EX PADS
6.0000 | MEDICATED_PAD | Freq: Every day | CUTANEOUS | Status: DC
  Administered 2018-06-23 – 2018-06-27 (×4): 6 via TOPICAL

## 2018-06-22 MED ORDER — SODIUM CHLORIDE 0.9% IV SOLUTION
Freq: Once | INTRAVENOUS | Status: AC
  Administered 2018-06-22: 10:00:00 via INTRAVENOUS

## 2018-06-22 MED ORDER — GLYCOPYRROLATE 0.2 MG/ML IJ SOLN
INTRAMUSCULAR | Status: AC
  Filled 2018-06-22: qty 7

## 2018-06-22 MED ORDER — GLYCOPYRROLATE 0.2 MG/ML IJ SOLN
INTRAMUSCULAR | Status: DC | PRN
  Administered 2018-06-22: 0.2 mg via INTRAVENOUS

## 2018-06-22 MED ORDER — GLYCOPYRROLATE 0.2 MG/ML IJ SOLN
INTRAMUSCULAR | Status: AC
  Filled 2018-06-22: qty 2

## 2018-06-22 MED ORDER — PROPOFOL 10 MG/ML IV BOLUS
INTRAVENOUS | Status: DC | PRN
  Administered 2018-06-22 (×3): 30 mg via INTRAVENOUS
  Administered 2018-06-22: 20 mg via INTRAVENOUS

## 2018-06-22 MED ORDER — CHLORHEXIDINE GLUCONATE 4 % EX LIQD
60.0000 mL | Freq: Once | CUTANEOUS | Status: AC
  Administered 2018-06-22: 4 via TOPICAL

## 2018-06-22 MED ORDER — PROPOFOL 10 MG/ML IV BOLUS
INTRAVENOUS | Status: AC
  Filled 2018-06-22: qty 40

## 2018-06-22 MED ORDER — PANTOPRAZOLE SODIUM 40 MG IV SOLR
40.0000 mg | Freq: Two times a day (BID) | INTRAVENOUS | Status: DC
  Administered 2018-06-22 – 2018-06-25 (×7): 40 mg via INTRAVENOUS
  Filled 2018-06-22 (×7): qty 40

## 2018-06-22 MED ORDER — EPHEDRINE SULFATE 50 MG/ML IJ SOLN
INTRAMUSCULAR | Status: AC
  Filled 2018-06-22: qty 2

## 2018-06-22 MED ORDER — LIDOCAINE HCL (PF) 2 % IJ SOLN
INTRAMUSCULAR | Status: AC
  Filled 2018-06-22: qty 10

## 2018-06-22 MED ORDER — PROPOFOL 500 MG/50ML IV EMUL
INTRAVENOUS | Status: AC
  Filled 2018-06-22: qty 50

## 2018-06-22 MED ORDER — GLYCOPYRROLATE 0.2 MG/ML IJ SOLN
INTRAMUSCULAR | Status: AC
  Filled 2018-06-22: qty 1

## 2018-06-22 MED ORDER — MUPIROCIN 2 % EX OINT
1.0000 "application " | TOPICAL_OINTMENT | Freq: Two times a day (BID) | CUTANEOUS | Status: DC
  Administered 2018-06-22 – 2018-06-26 (×9): 1 via NASAL
  Filled 2018-06-22: qty 22

## 2018-06-22 MED ORDER — SODIUM CHLORIDE 0.9% IV SOLUTION: Freq: Once | INTRAVENOUS | Status: AC

## 2018-06-22 MED ORDER — LIDOCAINE HCL (CARDIAC) PF 100 MG/5ML IV SOSY
PREFILLED_SYRINGE | INTRAVENOUS | Status: DC | PRN
  Administered 2018-06-22: 100 mg via INTRATRACHEAL

## 2018-06-22 MED ORDER — PROPOFOL 10 MG/ML IV BOLUS
INTRAVENOUS | Status: AC
  Filled 2018-06-22: qty 60

## 2018-06-22 MED ORDER — PHENYLEPHRINE HCL (PRESSORS) 10 MG/ML IV SOLN
INTRAVENOUS | Status: DC | PRN
  Administered 2018-06-22 (×2): 100 ug via INTRAVENOUS

## 2018-06-22 MED ORDER — LIDOCAINE HCL (PF) 2 % IJ SOLN
INTRAMUSCULAR | Status: AC
  Filled 2018-06-22: qty 50

## 2018-06-22 NOTE — Progress Notes (Signed)
Spoke with Dr. Dustin Flock regarding patient NPO status. Will hold medications until patient return from endoscopy.

## 2018-06-22 NOTE — Anesthesia Postprocedure Evaluation (Signed)
Anesthesia Post Note  Patient: [name]  Procedure(s) Performed: ENTEROSCOPY (N/A )  Patient location during evaluation: Endoscopy Anesthesia Type: General Level of consciousness: awake and alert and oriented Pain management: pain level controlled Vital Signs Assessment: post-procedure vital signs reviewed and stable Respiratory status: spontaneous breathing, nonlabored ventilation and respiratory function stable Cardiovascular status: blood pressure returned to baseline and stable Postop Assessment: no signs of nausea or vomiting Anesthetic complications: no     Last Vitals:  Vitals:   06/22/18 1238 06/22/18 1317  BP: 134/69 (!) 167/87  Pulse: 89 90  Resp: (!) 23   Temp:  36.8 C  SpO2: 95% 97%    Last Pain:  Vitals:   06/22/18 1317  TempSrc: Oral  PainSc:                  Rishita Petron

## 2018-06-22 NOTE — Progress Notes (Signed)
CRITICAL VALUE ALERT  Critical Value: + MRSA nasal swab Date & Time Notied:  6/5 1405 Provider Notified: Dr. Dustin Flock  Orders Received/Actions taken: Standing orders initiated.

## 2018-06-22 NOTE — Progress Notes (Signed)
Patient scheduled for enteroscopy under general anesthetic.  This is to be performed in the endoscopy suite.  Hemoglobin is 7 at this time.  Patient is not septic and for now will delay amputation of the great toe.  We will plan to perform likely Sunday morning.

## 2018-06-22 NOTE — Progress Notes (Signed)
[facility]   Communication Note Patient unable to sign consent before going to gastroenterology suite for endoscopy.  Patient has received anesthesia and will not be able to sign consent. Case is now scheduled on Tuesday with Dr. Delana Meyer. Will pre-op Monday.  Discussed with Dr. Eber Hong Theordore Cisnero PA-C 06/22/2018 12:40 PM

## 2018-06-22 NOTE — TOC Progression Note (Signed)
Transition of Care [facility]) - Progression Note    Patient Details  Name: [name] MRN: [id number] Date of Birth: [birth year]  [facility] of [facility] CM/SW Contact  Katrina Stack, RN Phone Number: 06/22/2018, 2:50 PM  Clinical Narrative:   Patient having endoscopy today and will have toe amputation.  Agreeable for home health in the event it is needed. No agency preference. Heads up referral for RN and PT   Expected Discharge Plan: [place]    Expected Discharge Plan and Services Expected Discharge Plan: [place]   Discharge Planning Services: CM Consult                                           Social Determinants of Health (SDOH) Interventions    Readmission Risk Interventions Readmission Risk Prevention Plan 09/30/2017  Transportation Screening Complete  PCP or Specialist Appt within 5-7 Days Complete  Home Care Screening Complete  Medication Review (RN CM) Complete  Some recent data might be hidden

## 2018-06-22 NOTE — Op Note (Signed)
[facility] Gastroenterology Patient Name: [name] Procedure Date: 06/22/2018 11:45 AM MRN: [id number] Account #: [id number] Date of Birth: 08-Apr-1955 Admit Type: Inpatient Age: 63 Room: [facility] ENDO ROOM 3 Gender: Female Note Status: Finalized Procedure:            Small bowel enteroscopy Indications:          Iron deficiency anemia secondary to chronic blood loss,                        , Obscure gastrointestinal bleeding, known h/o small                        bowel and gastric AVMs Providers:            Lin Landsman MD, MD Referring MD:         No Local Md, MD (Referring MD) Medicines:            Monitored Anesthesia Care Complications:        No immediate complications. Estimated blood loss: None. Procedure:            Pre-Anesthesia Assessment:                       - Prior to the procedure, a History and Physical was                        performed, and patient medications and allergies were                        reviewed. The patient is competent. The risks and                        benefits of the procedure and the sedation options and                        risks were discussed with the patient. All questions                        were answered and informed consent was obtained.                        Patient identification and proposed procedure were                        verified by the physician, the nurse, the                        anesthesiologist, the anesthetist and the technician in                        the pre-procedure area in the procedure room in the                        endoscopy suite. Mental Status Examination: alert and                        oriented. Airway Examination: normal oropharyngeal                        airway and neck mobility. Respiratory Examination:  clear to auscultation. CV Examination: normal.                        Prophylactic Antibiotics: The patient does not require                 prophylactic antibiotics. Prior Anticoagulants: The                        patient has taken no previous anticoagulant or                        antiplatelet agents. ASA Grade Assessment: III - A                        patient with severe systemic disease. After reviewing                        the risks and benefits, the patient was deemed in                        satisfactory condition to undergo the procedure. The                        anesthesia plan was to use monitored anesthesia care                        (MAC). Immediately prior to administration of                        medications, the patient was re-assessed for adequacy                        to receive sedatives. The heart rate, respiratory rate,                        oxygen saturations, blood pressure, adequacy of                        pulmonary ventilation, and response to care were                        monitored throughout the procedure. The physical status                        of the patient was re-assessed after the procedure.                       After obtaining informed consent, the endoscope was                        passed under direct vision. Throughout the procedure,                        the patient's blood pressure, pulse, and oxygen                        saturations were monitored continuously. The                        Colonoscope was introduced through the mouth and  advanced to the mid-jejunum. The small bowel                        enteroscopy was accomplished without difficulty. The                        patient tolerated the procedure well. Findings:      There was no evidence of significant pathology in the proximal jejunum       and in the mid-jejunum.      There was no evidence of significant pathology in the entire examined       duodenum.      Few oozing superficial gastric ulcers with oozing hemorrhage ([place]       Class Ib) were found in the  gastric body and on the greater curvature of       the stomach. The largest lesion was 5 mm in largest dimension.       Fulguration to stop the bleeding by bipolar probe was successful.      The gastroesophageal junction and examined esophagus were normal. Impression:           - The examined portion of the jejunum was normal.                       - Normal examined duodenum.                       - Oozing gastric ulcers with oozing hemorrhage ([place]                        Class Ib). Treated with bipolar cautery.                       - Normal gastroesophageal junction and esophagus.                       - No specimens collected. Recommendation:       - Return patient to hospital ward for ongoing care.                       - Resume regular diet today.                       - Use Protonix (pantoprazole) 40 mg PO BID long term.                       - Check H Pylori IgG                       - IV iron Procedure Code(s):    --- Professional ---                       [phone], Small intestinal endoscopy, enteroscopy beyond                        second portion of duodenum, not including ileum; with                        control of bleeding (eg, injection, bipolar cautery,  unipolar cautery, laser, heater probe, stapler, plasma                        coagulator) Diagnosis Code(s):    --- Professional ---                       K25.4, Chronic or unspecified gastric ulcer with                        hemorrhage                       D50.0, Iron deficiency anemia secondary to blood loss                        (chronic)                       K92.2, Gastrointestinal hemorrhage, unspecified CPT copyright 2019 American Medical Association. All rights reserved. The codes documented in this report are preliminary and upon coder review may  be revised to meet current compliance requirements. Dr. Ulyess Mort Lin Landsman MD, MD 06/22/2018 12:21:05 PM This report has been  signed electronically. Number of Addenda: 0 Note Initiated On: 06/22/2018 11:45 AM      [facility]

## 2018-06-22 NOTE — Consult Note (Signed)
[facility] Vascular Consult Note  MRN : [id number]  [name] is a 63 y.o. (Apr 03, 1955) female who presents with chief complaint of  Chief Complaint  Patient presents with  . Hyperglycemia   History of Present Illness:  The patient is a 63 year old female with a past medical history of coronary artery disease status post NSTEMI, diabetes, COPD with active tobacco abuse, congestive heart failure, history of cervical cancer, iron deficiency / B12 / folate anemia, history of gastric and colonic AVM who presented to the [facility] emergency department with a chief complaint of " feeling sick".  The patient is a poor historian.  Information for this consult was obtained by speaking to the patient, her nurse, and through previous epic notation.  Patient presented to the ED with a chief complaint of "feeling sick".  She was unable to provide a length of time or any other contributing symptoms.  ED work-up was notable for significant anemia.  Guaiac was minimally positive.  Patient was admitted for blood transfusion and further work-up.  The patient does note pain to her left great toe which on physical examination is noted to be ulcerous with gangrene.  Sounds like this ulceration has been present for a few months.  Seems as the patient was conducting her own wound care.  The patient was seen by podiatry who was concerned with possible contributing peripheral artery disease.  Podiatry plans on amputation of the left first toe.  Vascular surgery was consulted by Dr. Vickki Muff for evaluation of atherosclerotic disease to the left lower extremity Current Facility-Administered Medications  Medication Dose Route Frequency Provider Last Rate Last Dose  . [MAR Hold] 0.9 %  sodium chloride infusion  10 mL/hr Intravenous Once Nance Pear, MD      . Doug Sou Hold] 0.9 %  sodium chloride infusion  250 mL Intravenous PRN Seals, Theo Dills, NP      . Doug Sou Hold]  acetaminophen (TYLENOL) tablet 650 mg  650 mg Oral Q6H PRN Seals, Theo Dills, NP       Or  . Doug Sou Hold] acetaminophen (TYLENOL) suppository 650 mg  650 mg Rectal Q6H PRN Seals, Theo Dills, NP      . Doug Sou Hold] atorvastatin (LIPITOR) tablet 40 mg  40 mg Oral Daily Seals, Levada Dy H, NP   40 mg at 06/21/18 1822  . [MAR Hold] carvedilol (COREG) tablet 3.125 mg  3.125 mg Oral BID WC Seals, Angela H, NP   3.125 mg at 06/21/18 1822  . [MAR Hold] cefTRIAXone (ROCEPHIN) 1 g in sodium chloride 0.9 % 100 mL IVPB  1 g Intravenous Q24H Seals, Angela H, NP 200 mL/hr at 06/21/18 2307 1 g at 06/21/18 2307  . [MAR Hold] folic acid (FOLVITE) tablet 1 mg  1 mg Oral Daily Seals, Theo Dills, NP   Stopped at 06/22/18 1028  . [MAR Hold] furosemide (LASIX) tablet 40 mg  40 mg Oral Daily Seals, Theo Dills, NP   Stopped at 06/22/18 1029  . [MAR Hold] insulin aspart (novoLOG) injection 0-5 Units  0-5 Units Subcutaneous QHS Seals, Theo Dills, NP      . Doug Sou Hold] insulin glargine (LANTUS) injection 15 Units  15 Units Subcutaneous Daily Mayer Camel, NP   15 Units at 06/21/18 1014  . [MAR Hold] metroNIDAZOLE (FLAGYL) IVPB 500 mg  500 mg Intravenous Q8H Seals, Angela H, NP 100 mL/hr at 06/22/18 0624 500 mg at 06/22/18 0624  . [MAR Hold] mometasone-formoterol (DULERA) 200-5  MCG/ACT inhaler 2 puff  2 puff Inhalation BID Gardiner Barefoot H, NP   2 puff at 06/22/18 0953  . [MAR Hold] multivitamin with minerals tablet 1 tablet  1 tablet Oral Daily Seals, Theo Dills, NP   Stopped at 06/22/18 1029  . pantoprazole (PROTONIX) 80 mg in sodium chloride 0.9 % 250 mL (0.32 mg/mL) infusion  8 mg/hr Intravenous Continuous Nance Pear, MD 25 mL/hr at 06/22/18 0530 8 mg/hr at 06/22/18 0530  . [MAR Hold] pantoprazole (PROTONIX) injection 40 mg  40 mg Intravenous Q12H Nance Pear, MD      . Doug Sou Hold] polyethylene glycol (MIRALAX / GLYCOLAX) packet 17 g  17 g Oral Daily PRN Seals, Theo Dills, NP      . Doug Sou Hold] sodium chloride flush (NS) 0.9 %  injection 10-40 mL  10-40 mL Intracatheter Q12H Dustin Flock, MD   10 mL at 06/21/18 2322  . [MAR Hold] sodium chloride flush (NS) 0.9 % injection 10-40 mL  10-40 mL Intracatheter PRN Dustin Flock, MD      . Doug Sou Hold] sodium chloride flush (NS) 0.9 % injection 3 mL  3 mL Intravenous Q12H Seals, Levada Dy H, NP   3 mL at 06/21/18 2323  . [MAR Hold] sodium chloride flush (NS) 0.9 % injection 3 mL  3 mL Intravenous PRN Seals, Theo Dills, NP      . Doug Sou Hold] thiamine (VITAMIN B-1) tablet 100 mg  100 mg Oral Daily Seals, Theo Dills, NP   Stopped at 06/22/18 1030   Past Medical History:  Diagnosis Date  . Cervical cancer ([place])   . CHF (congestive heart failure) ([place])   . COPD (chronic obstructive pulmonary disease) ([place])   . Diabetes mellitus without complication ([place])   . NSTEMI (non-ST elevated myocardial infarction) [facility])    Past Surgical History:  Procedure Laterality Date  . APPENDECTOMY    . CARDIAC CATHETERIZATION    . CHOLECYSTECTOMY    . COLONOSCOPY N/A 09/28/2017   Procedure: COLONOSCOPY;  Surgeon: Toledo, Benay Pike, MD;  Location: [facility] ENDOSCOPY;  Service: Gastroenterology;  Laterality: N/A;  . ENTEROSCOPY Left 11/06/2017   Procedure: ENTEROSCOPY;  Surgeon: Jonathon Bellows, MD;  Location: [facility] ENDOSCOPY;  Service: Gastroenterology;  Laterality: Left;  . ESOPHAGOGASTRODUODENOSCOPY N/A 12/19/2016   Procedure: ESOPHAGOGASTRODUODENOSCOPY (EGD);  Surgeon: Lin Landsman, MD;  Location: [facility] ENDOSCOPY;  Service: Gastroenterology;  Laterality: N/A;  . ESOPHAGOGASTRODUODENOSCOPY N/A 09/28/2017   Procedure: ESOPHAGOGASTRODUODENOSCOPY (EGD);  Surgeon: Toledo, Benay Pike, MD;  Location: [facility] ENDOSCOPY;  Service: Gastroenterology;  Laterality: N/A;  . ESOPHAGOGASTRODUODENOSCOPY (EGD) WITH PROPOFOL N/A 08/08/2017   Procedure: ESOPHAGOGASTRODUODENOSCOPY (EGD) WITH PROPOFOL;  Surgeon: [name], MD;  Location: [facility] ENDOSCOPY;  Service: Endoscopy;  Laterality: N/A;  . heart [place] in [place]     . LEFT HEART CATH AND CORONARY ANGIOGRAPHY N/A 12/23/2016   Procedure: LEFT HEART CATH AND CORONARY ANGIOGRAPHY;  Surgeon: Teodoro Spray, MD;  Location: [facility] CV LAB;  Service: Cardiovascular;  Laterality: N/A;   Social History Social History   Tobacco Use  . Smoking status: Current Some Day Smoker    Packs/day: 0.25    Types: Cigarettes  . Smokeless tobacco: Never Used  Substance Use Topics  . Alcohol use: No  . Drug use: No   Family History Family History  Problem Relation Age of Onset  . Hypertension Mother   . Diabetes Mellitus II Mother   . Breast cancer Mother   . Heart disease Father   . Cancer Sister  Denies family history of peripheral artery disease, venous disease and/or bleeding/clotting disorders  Allergies  Allergen Reactions  . Latex Anaphylaxis and Rash  . Gabapentin Nausea And Vomiting and Other (See Comments)   REVIEW OF SYSTEMS (Negative unless checked)  Constitutional: [] Weight loss  [] Fever  [] Chills Cardiac: [] Chest pain   [] Chest pressure   [] Palpitations   [] Shortness of breath when laying flat   [] Shortness of breath at rest   [] Shortness of breath with exertion. Vascular:  [] Pain in legs with walking   [] Pain in legs at rest   [] Pain in legs when laying flat   [] Claudication   [x] Pain in feet when walking  [x] Pain in feet at rest  [x] Pain in feet when laying flat   [] History of DVT   [] Phlebitis   [] Swelling in legs   [] Varicose veins   [x] Non-healing ulcers Pulmonary:   [] Uses home oxygen   [] Productive cough   [] Hemoptysis   [] Wheeze  [] COPD   [] Asthma Neurologic:  [] Dizziness  [] Blackouts   [] Seizures   [] History of stroke   [] History of TIA  [] Aphasia   [] Temporary blindness   [] Dysphagia   [] Weakness or numbness in arms   [] Weakness or numbness in legs Musculoskeletal:  [] Arthritis   [] Joint swelling   [] Joint pain   [] Low back pain Hematologic:  [] Easy bruising  [] Easy bleeding   [] Hypercoagulable state   [] Anemic   [] Hepatitis Gastrointestinal:  [] Blood in stool   [] Vomiting blood  [] Gastroesophageal reflux/heartburn   [] Difficulty swallowing. Genitourinary:  [] Chronic kidney disease   [] Difficult urination  [] Frequent urination  [] Burning with urination   [] Blood in urine Skin:  [] Rashes   [x] Ulcers   [x] Wounds Psychological:  [] History of anxiety   []  History of major depression.  Physical Examination  Vitals:   06/22/18 0758 06/22/18 0958 06/22/18 1013 06/22/18 1103  BP: (!) 142/78 137/72 137/82 (!) 159/79  Pulse: 77 75 77 86  Resp: 16 15 14 16   Temp: 98.3 F (36.8 C) 98 F (36.7 C) 97.9 F (36.6 C) 97.6 F (36.4 C)  TempSrc: Oral Oral Oral Tympanic  SpO2: 93% 100% 100% 99%  Weight:      Height:       Body mass index is 27.46 kg/m. Gen:  WD/WN, NAD Head: [place]/AT, No temporalis wasting. Prominent temp pulse not noted. Ear/Nose/Throat: Hearing grossly intact, nares w/o erythema or drainage, oropharynx w/o Erythema/Exudate Eyes: Sclera non-icteric, conjunctiva clear Neck: Trachea midline.  No JVD.  Pulmonary:  Good air movement, respirations not labored, equal bilaterally.  Cardiac: RRR, normal S1, S2. Vascular:  Vessel Right Left  Radial Palpable Palpable  Ulnar Palpable Palpable  Brachial Palpable Palpable  Carotid Palpable, without bruit Palpable, without bruit  Aorta Not palpable N/A  Femoral Palpable Palpable  Popliteal Palpable Palpable  PT Non-Palpable Non-Palpable  DP Non-Palpable Non-Palpable   Left Lower Extremity: full thickness ulceration noted on first toe. Yellowish tinged drainage on dressing  Gastrointestinal: soft, non-tender/non-distended. No guarding/reflex.  Musculoskeletal: M/S 5/5 throughout.  Neurologic: Sensation grossly intact in extremities.  Symmetrical.  Speech is fluent. Motor exam as listed above. Psychiatric: Judgment intact, Mood & affect appropriate for pt's clinical situation. Dermatologic: As above Lymph : No Cervical, Axillary, or Inguinal  lymphadenopathy.  CBC Lab Results  Component Value Date   WBC 7.8 06/21/2018   HGB 7.5 (L) 06/22/2018   HCT 24.7 (L) 06/22/2018   MCV 78.6 (L) 06/21/2018   PLT 183 06/21/2018   BMET    Component Value Date/Time  NA 134 (L) 06/21/2018 1244   NA 139 08/03/2013 2138   K 4.1 06/21/2018 1244   K 3.4 (L) 08/03/2013 2138   CL 105 06/21/2018 1244   CL 104 08/03/2013 2138   CO2 18 (L) 06/21/2018 1244   CO2 27 08/03/2013 2138   GLUCOSE 120 (H) 06/21/2018 1244   GLUCOSE 153 (H) 08/03/2013 2138   BUN 24 (H) 06/21/2018 1244   BUN 16 08/03/2013 2138   CREATININE 1.75 (H) 06/21/2018 1244   CREATININE 1.19 08/03/2013 2138   CALCIUM 8.4 (L) 06/21/2018 1244   CALCIUM 9.2 08/03/2013 2138   GFRNONAA 30 (L) 06/21/2018 1244   GFRNONAA 50 (L) 08/03/2013 2138   GFRAA 35 (L) 06/21/2018 1244   GFRAA 58 (L) 08/03/2013 2138   Estimated Creatinine Clearance: 33.3 mL/min (A) (by C-G formula based on SCr of 1.75 mg/dL (H)).  COAG Lab Results  Component Value Date   INR 1.18 12/20/2016   INR 1.07 12/18/2016   INR 0.98 12/18/2016   Radiology Ct Abdomen Wo Contrast  Result Date: 06/21/2018 CLINICAL DATA:  63 year old female with abdominal pain. Concern for gastroenteritis or colitis. EXAM: CT ABDOMEN WITHOUT CONTRAST TECHNIQUE: Multidetector CT imaging of the abdomen was performed following the standard protocol without IV contrast. COMPARISON:  CT of the abdomen pelvis dated 03/12/2012 FINDINGS: Evaluation of this exam is limited in the absence of intravenous contrast. Lower chest: There is cardiomegaly with diffuse interstitial and interlobular septal prominence consistent with edema. Small bilateral pleural effusions noted. There is calcification of the mitral annulus. There is hypoattenuation of the cardiac blood pool suggestive of a degree of anemia. Clinical correlation is recommended. No free air noted in the visualized abdomen. There is a small perihepatic ascites. Hepatobiliary: Slight  irregularity of the liver contour suggestive of early cirrhosis, likely related to passive congestion and heart failure. Cholecystectomy. Pancreas: Unremarkable. No pancreatic ductal dilatation or surrounding inflammatory changes. Spleen: Normal in size without focal abnormality. Adrenals/Urinary Tract: The adrenal glands are unremarkable. There is no hydronephrosis on either side. Renal vascular calcifications noted. There is a 6 cm left renal inferior pole exophytic cysts. Additional smaller left renal hypodense lesions are not characterized but most likely represent cysts. The visualized proximal ureters are unremarkable. Stomach/Bowel: There is moderate stool in the visualized colon. No dilated bowel noted in the abdomen. Please note evaluation of the bowel is limited as the distal bowel in the lower abdomen and pelvis are not included in the CT of the abdomen. Vascular/Lymphatic: There is moderate atherosclerotic calcification of the aorta. No portal venous gas. There is no adenopathy. Other: Diffuse subcutaneous edema. Small fat containing supraumbilical hernia. No fluid collection. Musculoskeletal: Degenerative changes of the spine and osteopenia. No acute osseous pathology. IMPRESSION: 1. Cardiomegaly with findings of CHF and small bilateral pleural effusions. 2. Morphologically changes of early cirrhosis, likely secondary to passive congestion. Small perihepatic ascites. 3. The visualized bowel appear unremarkable. 4.  Aortic Atherosclerosis (ICD10-I70.0). Electronically Signed   By: Anner Crete M.D.   On: 06/21/2018 03:48   Dg Foot Complete Left  Result Date: 06/21/2018 CLINICAL DATA:  Diabetic foot ulcer. EXAM: LEFT FOOT - COMPLETE 3+ VIEW COMPARISON:  None. FINDINGS: There is soft tissue irregularity and overlying bandage material along the medial aspect of the great toe. There is also bone destruction involving the base of the 1st distal phalanx medially and medial aspects of the distal portion  of the 1st proximal phalanx. No soft tissue gas is seen. Diffuse distal and dorsal  soft tissue swelling is noted. Mild to moderate posterior calcaneal spur formation. IMPRESSION: 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 Electronically Signed   By: Claudie Revering M.D.   On: 06/21/2018 20:04   Assessment/Plan The patient is a 63 year old female with a past medical history of coronary artery disease status post NSTEMI, diabetes, COPD with active tobacco abuse, congestive heart failure, history of cervical cancer, iron deficiency / B12 / folate anemia, history of gastric and colonic AVM who presented to the [facility] emergency department with a chief complaint of " feeling sick" found to be anemia and ulceration of the first left toe 1. Chronic wound to left first toe: Patient with multiple risk factors for peripheral artery disease.  Unable to palpate pedal pulses on exam.  Chronic ulceration to the first left toe.  Recommend a left lower extremity angiogram with possible intervention to assess the patient's anatomy and exact degree of contributing peripheral artery disease.  If appropriate, an attempt to revascularize the leg can be made at that time.  Procedure, risks and benefits explained to the patient.  All questions answered.  Patient wishes to proceed. 2. Anemia: Patient is undergoing a endoscopy today with gastroenterology. 3. Tobacco Abuse: We had a discussion for approximately three minutes regarding the absolute need for smoking cessation due to the deleterious nature of tobacco on the vascular system. We discussed the tobacco use would diminish patency of any intervention, and likely significantly worsen progressio of disease.  4. Diabetes: On appropriate medications. Encouraged good control as its slows the progression of atherosclerotic disease.  Discussed with Dr. Francene Castle,  PA-C  06/22/2018 11:20 AM  This note was created with Dragon medical transcription system.  Any error is purely unintentional

## 2018-06-22 NOTE — Anesthesia Preprocedure Evaluation (Addendum)
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  Pt doesn't know anything about this. Not on anticoagulation)   Echo 2019: - Left ventricle: The cavity size was normal. There was moderate   concentric hypertrophy. Systolic function was mildly to   moderately reduced. The estimated ejection fraction was in the   range of 40% to 45%. Diffuse hypokinesis. - Aortic valve: A bioprosthesis was present. - Mitral valve: Calcified annulus. Mildly thickened leaflets .   There was moderate regurgitation. - Left atrium: The appendage was moderately dilated. - Right atrium: The appendage was moderately dilated. - Tricuspid valve: There was moderate regurgitation.   Neuro/Psych negative neurological ROS  negative psych ROS   GI/Hepatic negative GI ROS, Neg liver ROS,   Endo/Other  diabetes  Renal/GU negative Renal ROS  negative genitourinary   Musculoskeletal   Abdominal   Peds  Hematology  (+) Blood dyscrasia, anemia , Chronic anemia, presented with severe anemia Hgb <4, Hgb currently 7.5 s/p PRBC x 3 units, currently receiving PRBC.  Denies CP, SOB, lightheadedness.  VSS   Anesthesia Other Findings Past Medical History: No date: Cervical cancer (HCC) No date: CHF (congestive heart failure) (HCC) No date: COPD (chronic obstructive pulmonary disease) (HCC) No date: Diabetes mellitus without complication (HCC) No date: NSTEMI (non-ST elevated  myocardial infarction) ([place])  Past Surgical History: No date: APPENDECTOMY No date: CARDIAC CATHETERIZATION No date: CHOLECYSTECTOMY 09/28/2017: COLONOSCOPY; N/A     Comment:  Procedure: COLONOSCOPY;  Surgeon: Toledo, Benay Pike, MD;              Location: [facility] ENDOSCOPY;  Service: Gastroenterology;                Laterality: N/A; 11/06/2017: ENTEROSCOPY; Left     Comment:  Procedure: ENTEROSCOPY;  Surgeon: Jonathon Bellows, MD;                Location: [facility] ENDOSCOPY;  Service: Gastroenterology;                Laterality: Left; 12/19/2016: ESOPHAGOGASTRODUODENOSCOPY; N/A     Comment:  Procedure: ESOPHAGOGASTRODUODENOSCOPY (EGD);  Surgeon:               Lin Landsman, MD;  Location: [facility] ENDOSCOPY;                Service: Gastroenterology;  Laterality: N/A; 09/28/2017: ESOPHAGOGASTRODUODENOSCOPY; N/A     Comment:  Procedure: ESOPHAGOGASTRODUODENOSCOPY (EGD);  Surgeon:               Toledo, Benay Pike, MD;  Location: [facility] ENDOSCOPY;                Service: Gastroenterology;  Laterality: N/A; 08/08/2017: ESOPHAGOGASTRODUODENOSCOPY (EGD) WITH PROPOFOL; N/A     Comment:  Procedure: ESOPHAGOGASTRODUODENOSCOPY (EGD) WITH               PROPOFOL;  Surgeon: [name], MD;  Location: [facility]  ENDOSCOPY;  Service: Endoscopy;  Laterality: N/A; No date: heart [place] 12/23/2016: LEFT HEART CATH AND CORONARY ANGIOGRAPHY; N/A     Comment:  Procedure: LEFT HEART CATH AND CORONARY ANGIOGRAPHY;                Surgeon: Teodoro Spray, MD;  Location: [place] [facility]              LAB;  Service: Cardiovascular;  Laterality: N/A;  BMI    Body Mass Index:  27.46 kg/m      Reproductive/Obstetrics negative OB ROS                         Anesthesia Physical Anesthesia Plan  ASA: III  Anesthesia Plan: General   Post-op Pain Management:    Induction:   PONV Risk Score and Plan: Propofol infusion and TIVA  Airway Management Planned: Nasal Cannula  and Natural Airway  Additional Equipment:   Intra-op Plan:   Post-operative Plan:   Informed Consent: I have reviewed the patients History and Physical, chart, labs and discussed the procedure including the risks, benefits and alternatives for the proposed anesthesia with the patient or authorized representative who has indicated his/her understanding and acceptance.     Dental Advisory Given  Plan Discussed with: Anesthesiologist and CRNA  Anesthesia Plan Comments:         Anesthesia Quick Evaluation

## 2018-06-22 NOTE — TOC Progression Note (Signed)
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  No agency preference for home health.  Heads up referral to [place] for RN PT   Expected Discharge Plan: [place]    Expected Discharge Plan and Services Expected Discharge Plan: [place]   Discharge Planning Services: CM Consult   Living arrangements for the past 2 months: Single Family Home                               Date [place]: 06/22/18 Time [place]: 49 Representative spoke with at [place]: [place] ([place]) Interventions    Readmission Risk Interventions Readmission Risk Prevention Plan 09/30/2017  Transportation Screening Complete  PCP or Specialist Appt within 5-7 Days Complete  Home Care Screening Complete  Medication Review (RN CM) Complete  Some recent data might be hidden

## 2018-06-22 NOTE — Anesthesia Post-op Follow-up Note (Signed)
Anesthesia QCDR form completed.        

## 2018-06-22 NOTE — Transfer of Care (Signed)
Immediate Anesthesia Transfer of Care Note  Patient: [name]  Procedure(s) Performed: ENTEROSCOPY (N/A )  Patient Location: Endoscopy Unit  Anesthesia Type:General  Level of Consciousness: awake, alert , oriented and patient cooperative  Airway & Oxygen Therapy: Patient Spontanous Breathing  Post-op Assessment: Report given to RN and Post -op Vital signs reviewed and stable  Post vital signs: Reviewed and stable  Last Vitals:  Vitals Value Taken Time  BP 106/71 06/22/2018 12:18 PM  Temp 36.1 C 06/22/2018 12:18 PM  Pulse 86 06/22/2018 12:18 PM  Resp 22 06/22/2018 12:18 PM  SpO2 100 % 06/22/2018 12:18 PM    Last Pain:  Vitals:   06/22/18 1218  TempSrc: Tympanic  PainSc: 0-No pain      Patients Stated Pain Goal: 3 (86/76/19 5093)  Complications: No apparent anesthesia complications

## 2018-06-22 NOTE — Progress Notes (Signed)
[facility] [facility]                                                                                                                                                                                  Patient Demographics   [name], is a 63 y.o. female, DOB - 1955-11-02, QTM:[id number]  Admit date - 06/20/2018   Admitting Physician Hillary Bow, MD  Outpatient Primary MD for the patient is Patient, No Pcp Per   LOS - 2  Subjective: Patient known to me from previous admissions for recurrent anemia She is received 3 units of packed RBCs   Review of Systems:   CONSTITUTIONAL: No documented fever.  Positive for fatigue, positive weakness. No weight gain, no weight loss.  EYES: No blurry or double vision.  ENT: No tinnitus. No postnasal drip. No redness of the oropharynx.  RESPIRATORY: No cough, no wheeze, no hemoptysis. No dyspnea.  CARDIOVASCULAR: No chest pain. No orthopnea. No palpitations. No syncope.  GASTROINTESTINAL: No nausea, no vomiting or diarrhea. No abdominal pain. No melena or hematochezia.  GENITOURINARY: No dysuria or hematuria.  ENDOCRINE: No polyuria or nocturia. No heat or cold intolerance.  HEMATOLOGY: No anemia. No bruising. No bleeding.  INTEGUMENTARY: No rashes. No lesions.  MUSCULOSKELETAL: No arthritis. No swelling. No gout.  NEUROLOGIC: No numbness, tingling, or ataxia. No seizure-type activity.  PSYCHIATRIC: No anxiety. No insomnia. No ADD.    Vitals:   Vitals:   06/22/18 1218 06/22/18 1228 06/22/18 1238 06/22/18 1317  BP: 106/71 136/74 134/69 (!) 167/87  Pulse: 86 89 89 90  Resp: (!) 22 15 (!) 23   Temp: (!) 97 F (36.1 C)   98.3 F (36.8 C)  TempSrc: Tympanic   Oral  SpO2: 100% 97% 95% 97%  Weight:      Height:        Wt Readings from Last 3 Encounters:  06/20/18 74.8 kg  01/16/18 88.9 kg  12/07/17 84 kg     Intake/Output Summary (Last 24 hours) at 06/22/2018 1323 Last data filed at 06/22/2018 1214 Gross  per 24 hour  Intake 800 ml  Output 1300 ml  Net -500 ml    Physical Exam:   GENERAL: Pleasant-appearing in no apparent distress.  HEAD, EYES, EARS, NOSE AND THROAT: Atraumatic, normocephalic. Extraocular muscles are intact. Pupils equal and reactive to light. Sclerae anicteric. No conjunctival injection. No oro-pharyngeal erythema.  NECK: Supple. There is no jugular venous distention. No bruits, no lymphadenopathy, no thyromegaly.  HEART: Regular rate and rhythm,. No murmurs, no rubs, no clicks.  LUNGS: Clear to auscultation bilaterally. No rales or rhonchi. No wheezes.  ABDOMEN: Soft, flat, nontender, nondistended.  Has good bowel sounds. No hepatosplenomegaly appreciated.  EXTREMITIES: No evidence of any cyanosis, clubbing, or peripheral edema.  +2 pedal and radial pulses bilaterally.  NEUROLOGIC: The patient is alert, awake, and oriented x3 with no focal motor or sensory deficits appreciated bilaterally.  SKIN: Moist and warm with no rashes appreciated.  Psych: Not anxious, depressed LN: No inguinal LN enlargement    Antibiotics   Anti-infectives (From admission, onward)   Start     Dose/Rate Route Frequency Ordered Stop   06/20/18 2315  cefTRIAXone (ROCEPHIN) 1 g in sodium chloride 0.9 % 100 mL IVPB     1 g 200 mL/hr over 30 Minutes Intravenous Every 24 hours 06/20/18 2312     06/20/18 2315  metroNIDAZOLE (FLAGYL) IVPB 500 mg     500 mg 100 mL/hr over 60 Minutes Intravenous Every 8 hours 06/20/18 2312        Medications   Scheduled Meds: . atorvastatin  40 mg Oral Daily  . carvedilol  3.125 mg Oral BID WC  . folic acid  1 mg Oral Daily  . furosemide  40 mg Oral Daily  . insulin aspart  0-5 Units Subcutaneous QHS  . insulin glargine  15 Units Subcutaneous Daily  . mometasone-formoterol  2 puff Inhalation BID  . multivitamin with minerals  1 tablet Oral Daily  . [START ON 06/24/2018] pantoprazole  40 mg Intravenous Q12H  . sodium chloride flush  10-40 mL Intracatheter  Q12H  . sodium chloride flush  3 mL Intravenous Q12H  . thiamine  100 mg Oral Daily   Continuous Infusions: . sodium chloride    . cefTRIAXone (ROCEPHIN)  IV 1 g (06/21/18 2307)  . metronidazole 500 mg (06/22/18 0624)  . pantoprozole (PROTONIX) infusion 8 mg/hr (06/22/18 0530)   PRN Meds:.sodium chloride, acetaminophen **OR** acetaminophen, polyethylene glycol, sodium chloride flush, sodium chloride flush   Data Review:   Micro Results Recent Results (from the past 240 hour(s))  SARS Coronavirus 2 (CEPHEID- Performed in [place] hospital lab), Hosp Order     Status: None   Collection Time: 06/20/18  9:40 PM  Result Value Ref Range Status   SARS Coronavirus 2 NEGATIVE NEGATIVE Final    Comment: (NOTE) If result is NEGATIVE SARS-CoV-2 target nucleic acids are NOT DETECTED. The SARS-CoV-2 RNA is generally detectable in upper and lower  respiratory specimens during the acute phase of infection. The lowest  concentration of SARS-CoV-2 viral copies this assay can detect is 250  copies / mL. A negative result does not preclude SARS-CoV-2 infection  and should not be used as the sole basis for treatment or other  patient management decisions.  A negative result may occur with  improper specimen collection / handling, submission of specimen other  than nasopharyngeal swab, presence of viral mutation(s) within the  areas targeted by this assay, and inadequate number of viral copies  (<250 copies / mL). A negative result must be combined with clinical  observations, patient history, and epidemiological information. If result is POSITIVE SARS-CoV-2 target nucleic acids are DETECTED. The SARS-CoV-2 RNA is generally detectable in upper and lower  respiratory specimens dur ing the acute phase of infection.  Positive  results are indicative of active infection with SARS-CoV-2.  Clinical  correlation with patient history and other diagnostic information is  necessary to determine patient  infection status.  Positive results do  not rule out bacterial infection or co-infection with other viruses. If result is PRESUMPTIVE POSTIVE SARS-CoV-2 nucleic acids MAY BE  PRESENT.   A presumptive positive result was obtained on the submitted specimen  and confirmed on repeat testing.  While 2019 novel coronavirus  (SARS-CoV-2) nucleic acids may be present in the submitted sample  additional confirmatory testing may be necessary for epidemiological  and / or clinical management purposes  to differentiate between  SARS-CoV-2 and other Sarbecovirus currently known to infect humans.  If clinically indicated additional testing with an alternate test  methodology [phone]) is advised. The SARS-CoV-2 RNA is generally  detectable in upper and lower respiratory sp ecimens during the acute  phase of infection. The expected result is Negative. Fact Sheet for Patients:  StrictlyIdeas.no Fact Sheet for Healthcare Providers: BankingDealers.co.za This test is not yet approved or cleared by the [place] FDA and has been authorized for detection and/or diagnosis of SARS-CoV-2 by FDA under an Emergency Use Authorization (EUA).  This EUA will remain in effect (meaning this test can be used) for the duration of the COVID-19 declaration under Section 564(b)(1) of the Act, 21 U.S.C. section 360bbb-3(b)(1), unless the authorization is terminated or revoked sooner. Performed at [facility], [address] [place], [place] [address]   CULTURE, BLOOD (ROUTINE X 2) w Reflex to ID Panel     Status: None (Preliminary result)   Collection Time: 06/21/18  2:17 AM  Result Value Ref Range Status   Specimen Description BLOOD BLOOD RIGHT HAND  Final   Special Requests   Final    BOTTLES DRAWN AEROBIC AND ANAEROBIC Blood Culture adequate volume   Culture   Final    NO GROWTH 1 DAY Performed at [facility], [address], [place],  [place] [address]    Report Status PENDING  Incomplete  CULTURE, BLOOD (ROUTINE X 2) w Reflex to ID Panel     Status: None (Preliminary result)   Collection Time: 06/21/18 12:44 PM  Result Value Ref Range Status   Specimen Description   Final    BLOOD BLOOD LEFT ARM Performed at [facility], [address] [place], [place] [address]    Special Requests   Final    BOTTLES DRAWN AEROBIC AND ANAEROBIC Blood Culture results may not be optimal due to an excessive volume of blood received in culture bottles Performed at [facility], [address] [place],  [address]    Culture   Final    NO GROWTH < 24 HOURS Performed at [facility], [address], [place],  [address]    Report Status PENDING  Incomplete    Radiology Reports Ct Abdomen Wo Contrast  Result Date: 06/21/2018 CLINICAL DATA:  63 year old female with abdominal pain. Concern for gastroenteritis or colitis. EXAM: CT ABDOMEN WITHOUT CONTRAST TECHNIQUE: Multidetector CT imaging of the abdomen was performed following the standard protocol without IV contrast. COMPARISON:  CT of the abdomen pelvis dated 03/12/2012 FINDINGS: Evaluation of this exam is limited in the absence of intravenous contrast. Lower chest: There is cardiomegaly with diffuse interstitial and interlobular septal prominence consistent with edema. Small bilateral pleural effusions noted. There is calcification of the mitral annulus. There is hypoattenuation of the cardiac blood pool suggestive of a degree of anemia. Clinical correlation is recommended. No free air noted in the visualized abdomen. There is a small perihepatic ascites. Hepatobiliary: Slight irregularity of the liver contour suggestive of early cirrhosis, likely related to passive congestion and heart failure. Cholecystectomy. Pancreas: Unremarkable. No pancreatic ductal dilatation or surrounding inflammatory changes. Spleen: Normal in size without  focal abnormality.  Adrenals/Urinary Tract: The adrenal glands are unremarkable. There is no hydronephrosis on either side. Renal vascular calcifications noted. There is a 6 cm left renal inferior pole exophytic cysts. Additional smaller left renal hypodense lesions are not characterized but most likely represent cysts. The visualized proximal ureters are unremarkable. Stomach/Bowel: There is moderate stool in the visualized colon. No dilated bowel noted in the abdomen. Please note evaluation of the bowel is limited as the distal bowel in the lower abdomen and pelvis are not included in the CT of the abdomen. Vascular/Lymphatic: There is moderate atherosclerotic calcification of the aorta. No portal venous gas. There is no adenopathy. Other: Diffuse subcutaneous edema. Small fat containing supraumbilical hernia. No fluid collection. Musculoskeletal: Degenerative changes of the spine and osteopenia. No acute osseous pathology. IMPRESSION: 1. Cardiomegaly with findings of CHF and small bilateral pleural effusions. 2. Morphologically changes of early cirrhosis, likely secondary to passive congestion. Small perihepatic ascites. 3. The visualized bowel appear unremarkable. 4.  Aortic Atherosclerosis (ICD10-I70.0). Electronically Signed   By: Anner Crete M.D.   On: 06/21/2018 03:48   Dg Foot Complete Left  Result Date: 06/21/2018 CLINICAL DATA:  Diabetic foot ulcer. EXAM: LEFT FOOT - COMPLETE 3+ VIEW COMPARISON:  None. FINDINGS: There is soft tissue irregularity and overlying bandage material along the medial aspect of the great toe. There is also bone destruction involving the base of the 1st distal phalanx medially and medial aspects of the distal portion of the 1st proximal phalanx. No soft tissue gas is seen. Diffuse distal and dorsal soft tissue swelling is noted. Mild to moderate posterior calcaneal spur formation. IMPRESSION: Osteomyelitis involving the base of the 1st distal phalanx and distal aspect of the 1st proximal  phalanx. Electronically Signed   By: Claudie Revering M.D.   On: 06/21/2018 20:04     CBC Recent Labs  Lab 06/20/18 2038 06/21/18 1244 06/21/18 1708 06/21/18 2235 06/22/18 0108 06/22/18 0926  WBC 7.8 7.5 7.8  --   --   --   HGB 3.8* 8.2* 7.3* 7.1* 7.2* 7.5*  HCT 14.4* 27.2* 24.2* 23.5* 23.7* 24.7*  PLT 179 183 183  --   --   --   MCV 73.5* 78.4* 78.6*  --   --   --   MCH 19.4* 23.6* 23.7*  --   --   --   MCHC 26.4* 30.1 30.2  --   --   --   RDW 20.8* 21.1* 20.8*  --   --   --   LYMPHSABS 1.7  --  0.9  --   --   --   MONOABS 0.9  --  0.7  --   --   --   EOSABS 0.1  --  0.1  --   --   --   BASOSABS 0.0  --  0.1  --   --   --     Chemistries  Recent Labs  Lab 06/20/18 2038 06/21/18 1244  NA 135 134*  K 4.0 4.1  CL 110 105  CO2 17* 18*  GLUCOSE 166* 120*  BUN 26* 24*  CREATININE 1.85* 1.75*  CALCIUM 8.3* 8.4*  AST 12*  --   ALT 8  --   ALKPHOS 94  --   BILITOT 0.4  --    ------------------------------------------------------------------------------------------------------------------ estimated creatinine clearance is 33.3 mL/min (A) (by C-G formula based on SCr of 1.75 mg/dL (H)). ------------------------------------------------------------------------------------------------------------------ Recent Labs    06/21/18 0217  HGBA1C 5.9*   ------------------------------------------------------------------------------------------------------------------  No results for input(s): CHOL, HDL, LDLCALC, TRIG, CHOLHDL, LDLDIRECT in the last 72 hours. ------------------------------------------------------------------------------------------------------------------ Recent Labs    06/21/18 0217  TSH 1.964   ------------------------------------------------------------------------------------------------------------------ Recent Labs    06/20/18 2256 06/21/18 1244  VITAMINB12  --  528  FOLATE  --  14.4  FERRITIN  --  9*  TIBC 336  --   IRON 10*  --     Coagulation  profile No results for input(s): INR, PROTIME in the last 168 hours.  No results for input(s): DDIMER in the last 72 hours.  Cardiac Enzymes No results for input(s): CKMB, TROPONINI, MYOGLOBIN in the last 168 hours.  Invalid input(s): CK ------------------------------------------------------------------------------------------------------------------ Invalid input(s): [place]    1.  Severe anemia - We will transfuse 1 with more unit of packed RBCs today -Patient with history of similar presentation in the past -Status post dose of iron yesterday     2.    Upper GI bleed -Status post EGD with gastric ulcer with oozing status post cautery  -Protonix twice daily    3.  Left great toe diabetic wound - With history of recent debridement April 2020 - Continue IV antibiotic -Appreciate podiatry input plan for surgery on Sunday -Also seen by vascular plan for arteriogram on Tuesday  4. diabetes mellitus --Stable - Sliding scale insulin - Lantus insulin continued   DVT prophylaxis with SCDs and PPI prophylaxis initiated     Code Status Orders  (From admission, onward)         Start     Ordered   06/20/18 2301  Full code  Continuous     06 /03/20 2312        Code Status History    Date Active Date Inactive Code Status Order ID Comments User Context   01/13/2018 1540 01/16/2018 1859 Full Code [id number]  Loletha Grayer, MD ED   11/03/2017 1758 11/06/2017 2136 Full Code [id number]  Salary, Avel Peace, MD Inpatient   09/26/2017 2353 09/30/2017 1404 Full Code [id number]  Lance Coon, MD Inpatient   08/27/2017 0347 08/29/2017 2054 Full Code [id number]  Harrie Foreman, MD Inpatient   08/07/2017 1704 08/09/2017 2237 Full Code [id number]  Saundra Shelling, MD Inpatient   12/18/2016 1146 12/26/2016 2032 Full Code [id number]  Vaughan Basta, MD Inpatient   12/18/2016 0959 12/18/2016 1146 Full Code [id number]  Vaughan Basta, MD ED   12/04/2015  0509 12/07/2015 1756 Full Code [id number]  Holley Raring, NP ED   10/28/2015 1640 10/29/2015 1711 Full Code [id number]  Bettey Costa, MD Inpatient   08/29/2015 1438 08/30/2015 1750 Full Code [id number]  Hugelmeyer, Ubaldo Glassing, DO Inpatient           Consults gi  DVT Prophylaxis  Lovenox  Lab Results  Component Value Date   PLT 183 06/21/2018     Time Spent in minutes  15mn Greater than 50% of time spent in care coordination and counseling patient regarding the condition and plan of care.   SDustin FlockM.D on 06/22/2018 at 1:23 PM  Between 7am to 6pm - Pager - [phone]  After 6pm go to [email or web address] - [email or web address]   Office  [phone]

## 2018-06-23 LAB — BPAM RBC
Blood Product Expiration Date: 202006062359
Blood Product Expiration Date: 202007012359
Blood Product Expiration Date: 202007012359
Blood Product Expiration Date: 202007012359
ISSUE DATE / TIME: 202006032254
ISSUE DATE / TIME: 202006032254
ISSUE DATE / TIME: 202006040548
ISSUE DATE / TIME: 202006050933
Unit Type and Rh: 5100
Unit Type and Rh: 5100
Unit Type and Rh: 5100
Unit Type and Rh: 9500

## 2018-06-23 LAB — TYPE AND SCREEN
ABO/RH(D): O POS
Antibody Screen: NEGATIVE
Unit division: 0
Unit division: 0
Unit division: 0
Unit division: 0

## 2018-06-23 LAB — GLUCOSE, CAPILLARY
Glucose-Capillary: 122 mg/dL — ABNORMAL HIGH (ref 70–99)
Glucose-Capillary: 230 mg/dL — ABNORMAL HIGH (ref 70–99)
Glucose-Capillary: 166 mg/dL — ABNORMAL HIGH (ref 70–99)
Glucose-Capillary: 132 mg/dL — ABNORMAL HIGH (ref 70–99)

## 2018-06-23 LAB — HEMOGLOBIN AND HEMATOCRIT, BLOOD
HCT: 26.8 % — ABNORMAL LOW (ref 36.0–46.0)
Hemoglobin: 8.3 g/dL — ABNORMAL LOW (ref 12.0–15.0)

## 2018-06-23 MED ORDER — ENSURE PRE-SURGERY PO LIQD
296.0000 mL | Freq: Once | ORAL | Status: DC
  Filled 2018-06-23: qty 296

## 2018-06-23 NOTE — Anesthesia Preprocedure Evaluation (Addendum)
Anesthesia Evaluation  Patient identified by MRN, date of birth, ID band Patient awake    Reviewed: Allergy & Precautions, H&P , NPO status , Patient's Chart, lab work & pertinent test results  History of Anesthesia Complications Negative for: history of anesthetic complications  Airway Mallampati: II  TM Distance: >3 FB Neck ROM: limited    Dental  (+) Chipped, Poor Dentition, Missing   Pulmonary neg shortness of breath, pneumonia, COPD (has home O2, says she no longer uses it, she is not sure if she still needs it or not),  oxygen dependent, Current Smoker,           Cardiovascular Exercise Tolerance: Good (-) angina+ CAD, + Past MI (years ago) and +CHF  (-) DOE + Valvular Problems/Murmurs (AV bioprosthesis on echo.  Pt doesn't know anything about this. Not on anticoagulation)   Echo 2019: - Left ventricle: The cavity size was normal. There was moderate   concentric hypertrophy. Systolic function was mildly to   moderately reduced. The estimated ejection fraction was in the   range of 40% to 45%. Diffuse hypokinesis. - Aortic valve: A bioprosthesis was present. - Mitral valve: Calcified annulus. Mildly thickened leaflets .   There was moderate regurgitation. - Left atrium: The appendage was moderately dilated. - Right atrium: The appendage was moderately dilated. - Tricuspid valve: There was moderate regurgitation.   Neuro/Psych negative neurological ROS  negative psych ROS   GI/Hepatic negative GI ROS, Neg liver ROS, neg GERD  ,  Endo/Other  diabetes, Type 2, Insulin Dependent  Renal/GU negative Renal ROS  negative genitourinary   Musculoskeletal   Abdominal   Peds  Hematology  (+) Blood dyscrasia, anemia , Chronic anemia, presented with severe anemia Hgb <4, Hgb currently 7.5 s/p PRBC x 3 units, currently receiving PRBC.  Denies CP, SOB, lightheadedness.  VSS   Anesthesia Other Findings Past Medical  History: No date: Cervical cancer (HCC) No date: CHF (congestive heart failure) (HCC) No date: COPD (chronic obstructive pulmonary disease) (HCC) No date: Diabetes mellitus without complication (HCC) No date: NSTEMI (non-ST elevated myocardial infarction) ([place])  Past Surgical History: No date: APPENDECTOMY No date: CARDIAC CATHETERIZATION No date: CHOLECYSTECTOMY 09/28/2017: COLONOSCOPY; N/A     Comment:  Procedure: COLONOSCOPY;  Surgeon: Toledo, Benay Pike, MD;              Location: [facility] ENDOSCOPY;  Service: Gastroenterology;                Laterality: N/A; 11/06/2017: ENTEROSCOPY; Left     Comment:  Procedure: ENTEROSCOPY;  Surgeon: Jonathon Bellows, MD;                Location: [facility] ENDOSCOPY;  Service: Gastroenterology;                Laterality: Left; 12/19/2016: ESOPHAGOGASTRODUODENOSCOPY; N/A     Comment:  Procedure: ESOPHAGOGASTRODUODENOSCOPY (EGD);  Surgeon:               Lin Landsman, MD;  Location: [facility] ENDOSCOPY;                Service: Gastroenterology;  Laterality: N/A; 09/28/2017: ESOPHAGOGASTRODUODENOSCOPY; N/A     Comment:  Procedure: ESOPHAGOGASTRODUODENOSCOPY (EGD);  Surgeon:               Toledo, Benay Pike, MD;  Location: [facility] ENDOSCOPY;                Service: Gastroenterology;  Laterality: N/A; 08/08/2017: ESOPHAGOGASTRODUODENOSCOPY (EGD) WITH PROPOFOL; N/A  Comment:  Procedure: ESOPHAGOGASTRODUODENOSCOPY (EGD) WITH               PROPOFOL;  Surgeon: [name], MD;  Location: [facility]               ENDOSCOPY;  Service: Endoscopy;  Laterality: N/A; No date: heart [place] 12/23/2016: LEFT HEART CATH AND CORONARY ANGIOGRAPHY; N/A     Comment:  Procedure: LEFT HEART CATH AND CORONARY ANGIOGRAPHY;                Surgeon: Teodoro Spray, MD;  Location: [facility]              LAB;  Service: Cardiovascular;  Laterality: N/A;  BMI    Body Mass Index:  27.46 kg/m      Reproductive/Obstetrics negative OB ROS                            Anesthesia Physical  Anesthesia Plan  ASA: III  Anesthesia Plan: General LMA   Post-op Pain Management:    Induction: Intravenous  PONV Risk Score and Plan: Dexamethasone, Ondansetron, Midazolam and Treatment may vary due to age or medical condition  Airway Management Planned: Nasal Cannula and Natural Airway  Additional Equipment:   Intra-op Plan:   Post-operative Plan: Extubation in OR  Informed Consent: I have reviewed the patients History and Physical, chart, labs and discussed the procedure including the risks, benefits and alternatives for the proposed anesthesia with the patient or authorized representative who has indicated his/her understanding and acceptance.     Dental Advisory Given  Plan Discussed with: Anesthesiologist and CRNA  Anesthesia Plan Comments: (Patient consented for risks of anesthesia including but not limited to:  - adverse reactions to medications - damage to teeth, lips or other oral mucosa - sore throat or hoarseness - Damage to heart, brain, lungs or loss of life  Patient voiced understanding.)       Anesthesia Quick Evaluation

## 2018-06-23 NOTE — Progress Notes (Signed)
Performed wound dressing change at this time, as ordered Q shift. Patient tolerated well. Used 2 2x2's and some Kerlix. Wrapped around ankle. Appreciate help from [place], [place]. Will pass along in shift report. Will continue to monitor wound dressing status for remainder of shift. Wenda Low [facility]

## 2018-06-23 NOTE — Progress Notes (Addendum)
[place] [facility] [place] NAME: [name]    MR#:  [id number]  DATE OF BIRTH:  [birth year]  SUBJECTIVE:   Chief Complaint  Patient presents with  . Hyperglycemia  Patient seen at the bedside.  She reports that overall she is feeling much better than when she came in.  Denies any issues or concerns.  REVIEW OF SYSTEMS:  Review of Systems  Constitutional: Positive for malaise/fatigue. Negative for chills, fever and weight loss.  HENT: Negative for congestion, hearing loss and sore throat.   Eyes: Negative for blurred vision and double vision.  Respiratory: Positive for shortness of breath. Negative for cough and wheezing.   Cardiovascular: Positive for leg swelling. Negative for chest pain, palpitations and orthopnea.  Gastrointestinal: Negative for abdominal pain, diarrhea, nausea and vomiting.  Genitourinary: Negative for dysuria and urgency.  Musculoskeletal: Negative for myalgias.  Skin: Negative for rash.       Diabetic foot ulcer  Neurological: Negative for dizziness, sensory change, speech change, focal weakness and headaches.  Psychiatric/Behavioral: Negative for depression.    DRUG ALLERGIES:   Allergies  Allergen Reactions  . Latex Anaphylaxis and Rash  . Gabapentin Nausea And Vomiting and Other (See Comments)   VITALS:  Blood pressure (!) 150/75, pulse 81, temperature 98.2 F (36.8 C), temperature source Oral, resp. rate 19, height 5' 5"  (1.651 m), weight 87.6 kg, SpO2 99 %. PHYSICAL EXAMINATION:   GENERAL:  63 y.o.-year-old patient lying in the bed with no acute distress.  EYES: Pupils equal, round, reactive to light and accommodation. No scleral icterus. Extraocular muscles intact.  HEENT: Head atraumatic, normocephalic. Oropharynx and nasopharynx clear.  NECK:  Supple, no jugular venous distention. No thyroid enlargement, no tenderness.  LUNGS: Normal breath sounds bilaterally, no wheezing, rales,rhonchi or  crepitation. No use of accessory muscles of respiration.  CARDIOVASCULAR: S1, S2 normal. No murmurs, rubs, or gallops.  ABDOMEN: Soft, nontender, nondistended. Bowel sounds present. No organomegaly or mass.  EXTREMITIES: Lateral pitting edema of lower extremities.  No cyanosis, or clubbing. No rash or lesions. + pedal pulses MUSCULOSKELETAL: Normal bulk, and power was 5+ grip and elbow, knee, and ankle flexion and extension bilaterally.  NEUROLOGIC:Alert and oriented x 3. CN 2-12 intact. Sensation to light touch and cold stimuli intact bilaterally. Finger to nose nl. Babinski is downgoing. DTR's (biceps, patellar, and achilles) 2+ and symmetric throughout. Gait not tested due to safety concern. PSYCHIATRIC: The patient is alert and oriented x 3.  SKIN: Wound as below       DATA REVIEWED:  LABORATORY PANEL:  Female CBC Recent Labs  Lab 06/21/18 1708  06/23/18 0056  WBC 7.8  --   --   HGB 7.3*   < > 8.3*  HCT 24.2*   < > 26.8*  PLT 183  --   --    < > = values in this interval not displayed.   ------------------------------------------------------------------------------------------------------------------ Chemistries  Recent Labs  Lab 06/20/18 2038 06/21/18 1244  NA 135 134*  K 4.0 4.1  CL 110 105  CO2 17* 18*  GLUCOSE 166* 120*  BUN 26* 24*  CREATININE 1.85* 1.75*  CALCIUM 8.3* 8.4*  AST 12*  --   ALT 8  --   ALKPHOS 94  --   BILITOT 0.4  --    RADIOLOGY:  Dg Foot Complete Left  Result Date: 06/21/2018 CLINICAL DATA:  Diabetic foot ulcer. EXAM: LEFT FOOT - COMPLETE 3+ VIEW COMPARISON:  None. FINDINGS:  There is soft tissue irregularity and overlying bandage material along the medial aspect of the great toe. There is also bone destruction involving the base of the 1st distal phalanx medially and medial aspects of the distal portion of the 1st proximal phalanx. No soft tissue gas is seen. Diffuse distal and dorsal soft tissue swelling is noted. Mild to moderate posterior  calcaneal spur formation. IMPRESSION: 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 Electronically Signed   By: Claudie Revering M.D.   On: 06/21/2018 20:04   ASSESSMENT AND PLAN:   63 y.o. female with history of CHF, COPD, GI bleed, NSTEMI, diabetes mellitus and cervical cancer presenting with increased weakness and fatigue x1 month.  1. GI Bleed -likely upper GI status post EGD 06/22/2018 showing was in gastric ulcers (class Ib) treated with bipolar cautery - Post PRBC transfusion 06/22/2018 - IVF resuscitation to maintain MAP>65 - Continue to monitor H&H - Transfuse PRN Hgb<7 - Pantoprazole 40 mg p.o. twice daily - Check  Helicobacter pylori Ab + IgG - Hold NSAIDs, steroids, ASA - GI Consult appreciated  2.  Iron deficiency anemia -secondary to chronic blood loss - Status post IV iron infusion  3.  Left great toe osteomyelitis - patient with history of diabetes mellitus, has had recent debridement 4/20 - X-ray shows osteomyelitis involving the base of the first distal phalanx and distal aspect of the first proximal phalanx - Plan for amputation tomorrow a.m. per Ortho - Continue IV antibiotics - ID consult  4. DM: sugars have been well-controlled on current regimen  - hemoglobin A1c 1 6/5 was 5.9 - Continue sliding scale insulin coverage in addition to Lantus - ADA 2100 calorie diet  - FS BS qac and qhs   5. 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  Patient has had bioprosthesis for aortic valve - Beta-Blockade: Carvedilol  - Furosemide PO  - Low salt diet   6. chronic COPD  -evidence of exacerbation.  - Supplemental O2, goal sat 88-92% - Bronchodilators (albuterol/ipratropium) standing and PRN  - No risk factors for Pseudomonas  7. DVT prophylaxis - Hold anti-coagulation due to GI bleed and pending procedure   All  the records are reviewed and case discussed with Care Management/Social Worker. Management plans discussed with the patient, family and they are in agreement.  CODE STATUS: Full Code  TOTAL TIME TAKING CARE OF THIS PATIENT: 35 minutes.   More than 50% of the time was spent in counseling/coordination of care: YES  POSSIBLE D/C IN 1 DAYS, DEPENDING ON CLINICAL CONDITION.   on 06/23/2018 at 11:58 AM  This patient was staffed with Dr. Valetta Fuller, [facility] who personally evaluated patient, reviewed documentation and agreed with assessment and plan of care as above.  Rufina Falco, DNP, FNP-BC [facility] Nurse Practitioner   Between 7am to 6pm - Pager [phone]  After 6pm go to [email or web address] - Proofreader  [facility] [place] [facility]  Office  [phone]  CC: Primary care physician; Patient, No Pcp Per  Note: This dictation was prepared with Dragon dictation along with smaller phrase technology. Any transcriptional errors that result from this process are unintentional.

## 2018-06-23 NOTE — Progress Notes (Signed)
Daily Progress Note   Subjective  - 1 Day Post-Op  F/u left great toe osteomyelitis. Surgery cancelled yesterday as pt was going to endo.  Objective Vitals:   06/22/18 2021 06/23/18 0126 06/23/18 0353 06/23/18 0731  BP: (!) 149/80  (!) 145/86 (!) 150/75  Pulse: 92  79 81  Resp: 20  20 19   Temp: 98.7 F (37.1 C)  98.8 F (37.1 C) 98.2 F (36.8 C)  TempSrc: Oral  Oral Oral  SpO2: 99%  97% 99%  Weight:  87.6 kg    Height:        Physical Exam: Open wound left great toe.  Osteomyelitis on xray.  Laboratory CBC    Component Value Date/Time   WBC 7.8 06/21/2018 1708   HGB 8.3 (L) 06/23/2018 0056   HGB 7.3 (L) 11/23/2017 1454   HCT 26.8 (L) 06/23/2018 0056   HCT 45.7 08/03/2013 2138   PLT 183 06/21/2018 1708   PLT 118 (L) 08/03/2013 2138    BMET    Component Value Date/Time   NA 134 (L) 06/21/2018 1244   NA 139 08/03/2013 2138   K 4.1 06/21/2018 1244   K 3.4 (L) 08/03/2013 2138   CL 105 06/21/2018 1244   CL 104 08/03/2013 2138   CO2 18 (L) 06/21/2018 1244   CO2 27 08/03/2013 2138   GLUCOSE 120 (H) 06/21/2018 1244   GLUCOSE 153 (H) 08/03/2013 2138   BUN 24 (H) 06/21/2018 1244   BUN 16 08/03/2013 2138   CREATININE 1.75 (H) 06/21/2018 1244   CREATININE 1.19 08/03/2013 2138   CALCIUM 8.4 (L) 06/21/2018 1244   CALCIUM 9.2 08/03/2013 2138   GFRNONAA 30 (L) 06/21/2018 1244   GFRNONAA 50 (L) 08/03/2013 2138   GFRAA 35 (L) 06/21/2018 1244   GFRAA 58 (L) 08/03/2013 2138    Assessment/Planning: Osteomyelitis left great to   Plan for amputation tomorrow am.  D/w pt r/b/a/c.  All questions answered.  Orders placed.    Samara Deist A  06/23/2018, 11:01 AM

## 2018-06-23 NOTE — Progress Notes (Signed)
Attempted to call patient's husband Kela Millin, man answering phone didn't know a "Kela Millin". Will continue to monitor. Wenda Low [facility]

## 2018-06-23 NOTE — Progress Notes (Signed)
Applied Prevalon boots bilaterally at this time, as ordered continuously. Patient stated she would leave them on until she goes to sleep. Will continue to encourage use. Wenda Low [facility]

## 2018-06-23 NOTE — Progress Notes (Signed)
There was no gauze pads on the patient's 2 distal necrotic toes this AM, for unknown reasons. Wound orders stated there should be. Either I or charge RN Colletta [place] will address prior to end of shift. Wenda Low [facility]

## 2018-06-24 ENCOUNTER — Inpatient Hospital Stay: Payer: Medicaid Other | Admitting: Anesthesiology

## 2018-06-24 ENCOUNTER — Encounter: Payer: Self-pay | Admitting: Anesthesiology

## 2018-06-24 ENCOUNTER — Encounter
Admission: EM | Disposition: A | Discharge status: Home Health | Payer: Self-pay | Source: Home / Self Care | Attending: Internal Medicine

## 2018-06-24 DIAGNOSIS — D649 Anemia, unspecified: Secondary | ICD-10-CM

## 2018-06-24 HISTORY — PX: AMPUTATION: SHX166

## 2018-06-24 LAB — BASIC METABOLIC PANEL
Anion gap: 8 (ref 5–15)
BUN: 24 mg/dL — ABNORMAL HIGH (ref 8–23)
CO2: 21 mmol/L — ABNORMAL LOW (ref 22–32)
Calcium: 8.5 mg/dL — ABNORMAL LOW (ref 8.9–10.3)
Chloride: 109 mmol/L (ref 98–111)
Creatinine, Ser: 1.71 mg/dL — ABNORMAL HIGH (ref 0.44–1.00)
GFR calc Af Amer: 36 mL/min — ABNORMAL LOW (ref >60)
GFR calc non Af Amer: 31 mL/min — ABNORMAL LOW (ref >60)
Glucose, Bld: 104 mg/dL — ABNORMAL HIGH (ref 70–99)
Potassium: 3.9 mmol/L (ref 3.5–5.1)
Sodium: 138 mmol/L (ref 135–145)

## 2018-06-24 LAB — CBC WITH DIFFERENTIAL/PLATELET
Abs Immature Granulocytes: 0.04 10*3/uL (ref 0.00–0.07)
Basophils Absolute: 0.1 10*3/uL (ref 0.0–0.1)
Basophils Relative: 1 %
Eosinophils Absolute: 0.2 10*3/uL (ref 0.0–0.5)
Eosinophils Relative: 2 %
HCT: 29.2 % — ABNORMAL LOW (ref 36.0–46.0)
Hemoglobin: 8.7 g/dL — ABNORMAL LOW (ref 12.0–15.0)
Immature Granulocytes: 1 %
Lymphocytes Relative: 15 %
Lymphs Abs: 1.2 10*3/uL (ref 0.7–4.0)
MCH: 24.7 pg — ABNORMAL LOW (ref 26.0–34.0)
MCHC: 29.8 g/dL — ABNORMAL LOW (ref 30.0–36.0)
MCV: 83 fL (ref 80.0–100.0)
Monocytes Absolute: 0.7 10*3/uL (ref 0.1–1.0)
Monocytes Relative: 8 %
Neutro Abs: 5.9 10*3/uL (ref 1.7–7.7)
Neutrophils Relative %: 73 %
Platelets: 183 10*3/uL (ref 150–400)
RBC: 3.52 MIL/uL — ABNORMAL LOW (ref 3.87–5.11)
RDW: 22.8 % — ABNORMAL HIGH (ref 11.5–15.5)
Smear Review: NORMAL
WBC: 8 10*3/uL (ref 4.0–10.5)
nRBC: 1 % — ABNORMAL HIGH (ref 0.0–0.2)

## 2018-06-24 LAB — GLUCOSE, CAPILLARY
Glucose-Capillary: 95 mg/dL (ref 70–99)
Glucose-Capillary: 160 mg/dL — ABNORMAL HIGH (ref 70–99)
Glucose-Capillary: 111 mg/dL — ABNORMAL HIGH (ref 70–99)
Glucose-Capillary: 112 mg/dL — ABNORMAL HIGH (ref 70–99)

## 2018-06-24 SURGERY — AMPUTATION, FOOT, RAY
Anesthesia: General | Laterality: Left

## 2018-06-24 MED ORDER — PROPOFOL 10 MG/ML IV BOLUS
INTRAVENOUS | Status: AC
  Filled 2018-06-24: qty 40

## 2018-06-24 MED ORDER — PHENYLEPHRINE HCL (PRESSORS) 10 MG/ML IV SOLN
INTRAVENOUS | Status: DC | PRN
  Administered 2018-06-24: 100 ug via INTRAVENOUS

## 2018-06-24 MED ORDER — PROPOFOL 10 MG/ML IV BOLUS
INTRAVENOUS | Status: DC | PRN
  Administered 2018-06-24: 100 mg via INTRAVENOUS

## 2018-06-24 MED ORDER — LIDOCAINE HCL (PF) 1 % IJ SOLN
INTRAMUSCULAR | Status: AC
  Filled 2018-06-24: qty 30

## 2018-06-24 MED ORDER — OXYCODONE HCL 5 MG/5ML PO SOLN: 5.0000 mg | Freq: Once | ORAL | Status: DC | PRN

## 2018-06-24 MED ORDER — FENTANYL CITRATE (PF) 100 MCG/2ML IJ SOLN
INTRAMUSCULAR | Status: AC
  Filled 2018-06-24: qty 2

## 2018-06-24 MED ORDER — LACTATED RINGERS IV SOLN
INTRAVENOUS | Status: DC | PRN
  Administered 2018-06-24: 08:00:00 via INTRAVENOUS

## 2018-06-24 MED ORDER — ONDANSETRON HCL 4 MG/2ML IJ SOLN
INTRAMUSCULAR | Status: DC | PRN
  Administered 2018-06-24: 4 mg via INTRAVENOUS

## 2018-06-24 MED ORDER — OXYCODONE-ACETAMINOPHEN 5-325 MG PO TABS
1.0000 | ORAL_TABLET | Freq: Four times a day (QID) | ORAL | Status: DC | PRN
  Administered 2018-06-24 – 2018-06-25 (×3): 2 via ORAL
  Administered 2018-06-26: 1 via ORAL
  Administered 2018-06-26: 2 via ORAL
  Administered 2018-06-27: 08:00:00 1 via ORAL
  Filled 2018-06-24: qty 1
  Filled 2018-06-24 (×3): qty 2
  Filled 2018-06-24: qty 1
  Filled 2018-06-24: qty 2

## 2018-06-24 MED ORDER — LIDOCAINE HCL (PF) 1 % IJ SOLN
INTRAMUSCULAR | Status: DC | PRN
  Administered 2018-06-24: 7 mL

## 2018-06-24 MED ORDER — BUPIVACAINE HCL 0.5 % IJ SOLN
INTRAMUSCULAR | Status: DC | PRN
  Administered 2018-06-24: 10 mL

## 2018-06-24 MED ORDER — LIDOCAINE HCL (CARDIAC) PF 100 MG/5ML IV SOSY
PREFILLED_SYRINGE | INTRAVENOUS | Status: DC | PRN
  Administered 2018-06-24: 80 mg via INTRAVENOUS

## 2018-06-24 MED ORDER — SODIUM CHLORIDE 0.9 % IV SOLN
INTRAVENOUS | Status: DC
  Administered 2018-06-24: 08:00:00 50 mL/h via INTRAVENOUS

## 2018-06-24 MED ORDER — FENTANYL CITRATE (PF) 100 MCG/2ML IJ SOLN: 25.0000 ug | INTRAMUSCULAR | Status: DC | PRN

## 2018-06-24 MED ORDER — OXYCODONE HCL 5 MG PO TABS: 5.0000 mg | ORAL_TABLET | Freq: Once | ORAL | Status: DC | PRN

## 2018-06-24 MED ORDER — BUPIVACAINE HCL (PF) 0.5 % IJ SOLN
INTRAMUSCULAR | Status: AC
  Filled 2018-06-24: qty 30

## 2018-06-24 MED ORDER — FENTANYL CITRATE (PF) 100 MCG/2ML IJ SOLN
INTRAMUSCULAR | Status: DC | PRN
  Administered 2018-06-24: 25 ug via INTRAVENOUS
  Administered 2018-06-24: 50 ug via INTRAVENOUS
  Administered 2018-06-24: 25 ug via INTRAVENOUS

## 2018-06-24 MED ORDER — SODIUM CHLORIDE FLUSH 0.9 % IV SOLN
INTRAVENOUS | Status: AC
  Filled 2018-06-24: qty 10

## 2018-06-24 SURGICAL SUPPLY — 43 items
BANDAGE ELASTIC 4 LF NS (GAUZE/BANDAGES/DRESSINGS) ×2 IMPLANT
BLADE OSC/SAGITTAL MD 5.5X18 (BLADE) IMPLANT
BLADE SURG MINI STRL (BLADE) ×2 IMPLANT
BNDG CONFORM 2 STRL LF (GAUZE/BANDAGES/DRESSINGS) ×2 IMPLANT
BNDG CONFORM 3 STRL LF (GAUZE/BANDAGES/DRESSINGS) ×4 IMPLANT
BNDG ESMARK 4X12 TAN STRL LF (GAUZE/BANDAGES/DRESSINGS) ×2 IMPLANT
BNDG GAUZE 4.5X4.1 6PLY STRL (MISCELLANEOUS) ×2 IMPLANT
CANISTER SUCT 1200ML W/VALVE (MISCELLANEOUS) ×2 IMPLANT
COVER WAND RF STERILE (DRAPES) ×2 IMPLANT
CUFF TOURN SGL QUICK 12 (TOURNIQUET CUFF) IMPLANT
CUFF TOURN SGL QUICK 18X4 (TOURNIQUET CUFF) ×2 IMPLANT
DRAPE FLUOR MINI C-ARM 54X84 (DRAPES) ×2 IMPLANT
DRAPE XRAY CASSETTE 23X24 (DRAPES) IMPLANT
DURAPREP 26ML APPLICATOR (WOUND CARE) ×2 IMPLANT
ELECT REM PT RETURN 9FT ADLT (ELECTROSURGICAL) ×2
ELECTRODE REM PT RTRN 9FT ADLT (ELECTROSURGICAL) ×1 IMPLANT
GAUZE PACKING IODOFORM 1/2 (PACKING) ×2 IMPLANT
GAUZE SPONGE 4X4 12PLY STRL (GAUZE/BANDAGES/DRESSINGS) ×2 IMPLANT
GAUZE XEROFORM 1X8 LF (GAUZE/BANDAGES/DRESSINGS) ×2 IMPLANT
GLOVE BIO SURGEON STRL SZ7.5 (GLOVE) ×2 IMPLANT
GLOVE INDICATOR 8.0 STRL GRN (GLOVE) ×2 IMPLANT
GOWN STRL REUS W/ TWL LRG LVL3 (GOWN DISPOSABLE) ×2 IMPLANT
GOWN STRL REUS W/TWL LRG LVL3 (GOWN DISPOSABLE) ×2
KIT TURNOVER KIT A (KITS) ×2 IMPLANT
LABEL OR SOLS (LABEL) IMPLANT
NEEDLE FILTER BLUNT 18X 1/2SAF (NEEDLE) ×1
NEEDLE FILTER BLUNT 18X1 1/2 (NEEDLE) ×1 IMPLANT
NEEDLE HYPO 25X1 1.5 SAFETY (NEEDLE) ×2 IMPLANT
NS IRRIG 500ML POUR BTL (IV SOLUTION) ×2 IMPLANT
PACK EXTREMITY ARMC (MISCELLANEOUS) ×2 IMPLANT
PAD ABD DERMACEA PRESS 5X9 (GAUZE/BANDAGES/DRESSINGS) IMPLANT
PULSAVAC PLUS IRRIG FAN TIP (DISPOSABLE)
SHIELD FULL FACE ANTIFOG 7M (MISCELLANEOUS) ×2 IMPLANT
SOL .9 NS 3000ML IRR  AL (IV SOLUTION)
SOL .9 NS 3000ML IRR UROMATIC (IV SOLUTION) IMPLANT
STOCKINETTE M/LG 89821 (MISCELLANEOUS) ×2 IMPLANT
STRAP SAFETY 5IN WIDE (MISCELLANEOUS) ×2 IMPLANT
SUT ETHILON 3-0 FS-10 30 BLK (SUTURE) ×2
SUT ETHILON 5-0 FS-2 18 BLK (SUTURE) ×2 IMPLANT
SUT VIC AB 4-0 FS2 27 (SUTURE) ×2 IMPLANT
SUTURE EHLN 3-0 FS-10 30 BLK (SUTURE) ×1 IMPLANT
SYR 10ML LL (SYRINGE) ×6 IMPLANT
TIP FAN IRRIG PULSAVAC PLUS (DISPOSABLE) IMPLANT

## 2018-06-24 NOTE — Progress Notes (Signed)
Tanya Antigua, MD [address], [place], [place], [place], [address] [address], [place], [place], [place], [address] Phone: [phone]  Fax: [phone]   Subjective: Patient tolerating oral diet well without difficulty.  No further episodes of bleeding.   Objective: Exam: Vital signs in last 24 hours: Vitals:   06/24/18 0911 06/24/18 0921 06/24/18 0930 06/24/18 0958  BP: (!) 144/66 (!) 154/72 136/72 (!) 150/78  Pulse: 84 76 70 73  Resp: 17 16 15 17   Temp:  97.8 F (36.6 C)  (!) 97.4 F (36.3 C)  TempSrc:      SpO2: 96% 97% 97% 97%  Weight:      Height:       Weight change: 0 kg  Intake/Output Summary (Last 24 hours) at 06/24/2018 1200 Last data filed at 06/24/2018 0623 Gross per 24 hour  Intake 260 ml  Output 3301 ml  Net -3041 ml    General: No acute distress, AAO x3 Abd: Soft, NT/ND, No HSM Skin: Warm, no rashes Neck: Supple, Trachea midline   Lab Results: Lab Results  Component Value Date   WBC 8.0 06/24/2018   HGB 8.7 (L) 06/24/2018   HCT 29.2 (L) 06/24/2018   MCV 83.0 06/24/2018   PLT 183 06/24/2018   Micro Results: Recent Results (from the past 240 hour(s))  SARS Coronavirus 2 (CEPHEID- Performed in [facility] hospital lab), Hosp Order     Status: None   Collection Time: 06/20/18  9:40 PM  Result Value Ref Range Status   SARS Coronavirus 2 NEGATIVE NEGATIVE Final    Comment: (NOTE) If result is NEGATIVE SARS-CoV-2 target nucleic acids are NOT DETECTED. The SARS-CoV-2 RNA is generally detectable in upper and lower  respiratory specimens during the acute phase of infection. The lowest  concentration of SARS-CoV-2 viral copies this assay can detect is 250  copies / mL. A negative result does not preclude SARS-CoV-2 infection  and should not be used as the sole basis for treatment or other  patient management decisions.  A negative result may occur with  improper specimen collection / handling, submission of specimen other  than  nasopharyngeal swab, presence of viral mutation(s) within the  areas targeted by this assay, and inadequate number of viral copies  (<250 copies / mL). A negative result must be combined with clinical  observations, patient history, and epidemiological information. If result is POSITIVE SARS-CoV-2 target nucleic acids are DETECTED. The SARS-CoV-2 RNA is generally detectable in upper and lower  respiratory specimens dur ing the acute phase of infection.  Positive  results are indicative of active infection with SARS-CoV-2.  Clinical  correlation with patient history and other diagnostic information is  necessary to determine patient infection status.  Positive results do  not rule out bacterial infection or co-infection with other viruses. If result is PRESUMPTIVE POSTIVE SARS-CoV-2 nucleic acids MAY BE PRESENT.   A presumptive positive result was obtained on the submitted specimen  and confirmed on repeat testing.  While 2019 novel coronavirus  (SARS-CoV-2) nucleic acids may be present in the submitted sample  additional confirmatory testing may be necessary for epidemiological  and / or clinical management purposes  to differentiate between  SARS-CoV-2 and other Sarbecovirus currently known to infect humans.  If clinically indicated additional testing with an alternate test  methodology [id number]) is advised. The SARS-CoV-2 RNA is generally  detectable in upper and lower respiratory sp ecimens during the acute  phase of infection. The expected result is Negative. Fact  Sheet for Patients:  StrictlyIdeas.no Fact Sheet for Healthcare Providers: BankingDealers.co.za This test is not yet approved or cleared by the [place] FDA and has been authorized for detection and/or diagnosis of SARS-CoV-2 by FDA under an Emergency Use Authorization (EUA).  This EUA will remain in effect (meaning this test can be used) for the duration of the  COVID-19 declaration under Section 564(b)(1) of the Act, 21 U.S.C. section 360bbb-3(b)(1), unless the authorization is terminated or revoked sooner. Performed at [facility], [address] [place], [place] [address]   CULTURE, BLOOD (ROUTINE X 2) w Reflex to ID Panel     Status: None (Preliminary result)   Collection Time: 06/21/18  2:17 AM  Result Value Ref Range Status   Specimen Description BLOOD BLOOD RIGHT HAND  Final   Special Requests   Final    BOTTLES DRAWN AEROBIC AND ANAEROBIC Blood Culture adequate volume   Culture   Final    NO GROWTH 3 DAYS Performed at [facility], [address] [place], [place] [address]    Report Status PENDING  Incomplete  CULTURE, BLOOD (ROUTINE X 2) w Reflex to ID Panel     Status: None (Preliminary result)   Collection Time: 06/21/18 12:44 PM  Result Value Ref Range Status   Specimen Description   Final    BLOOD BLOOD LEFT ARM Performed at [facility], [address] [place], [place] [address]    Special Requests   Final    BOTTLES DRAWN AEROBIC AND ANAEROBIC Blood Culture results may not be optimal due to an excessive volume of blood received in culture bottles Performed at [facility] [place], [address], [place], [place] [address]    Culture   Final    NO GROWTH 3 DAYS Performed at [facility], [address] [place], [place] [address]    Report Status PENDING  Incomplete  MRSA PCR Screening     Status: Abnormal   Collection Time: 06/22/18 11:42 AM  Result Value Ref Range Status   MRSA by PCR POSITIVE (A) NEGATIVE Final    Comment:        The GeneXpert MRSA Assay (FDA approved for NASAL specimens only), is one component of a comprehensive MRSA colonization surveillance program. It is not intended to diagnose MRSA infection nor to guide or monitor treatment for MRSA infections. RESULT CALLED TO, READ BACK BY AND VERIFIED WITH: JANCY JOHNSON @1406  ON 06/22/2018 BY FMW  Performed at [facility], [address] [place], [place] [address]    Studies/Results: No results found. Medications:  Scheduled Meds: . atorvastatin  40 mg Oral Daily  . carvedilol  3.125 mg Oral BID WC  . Chlorhexidine Gluconate Cloth  6 each Topical Q0600  . folic acid  1 mg Oral Daily  . furosemide  40 mg Oral Daily  . insulin aspart  0-5 Units Subcutaneous QHS  . insulin glargine  15 Units Subcutaneous Daily  . mometasone-formoterol  2 puff Inhalation BID  . multivitamin with minerals  1 tablet Oral Daily  . mupirocin ointment  1 application Nasal BID  . pantoprazole (PROTONIX) IV  40 mg Intravenous Q12H  . sodium chloride flush  10-40 mL Intracatheter Q12H  . sodium chloride flush  3 mL Intravenous Q12H  . sodium chloride flush      . thiamine  100 mg Oral Daily   Continuous Infusions: . sodium chloride 250 mL (06/24/18 0118)  . cefTRIAXone (ROCEPHIN)  IV 1 g (06/23/18 2357)  . metronidazole 500 mg (06/24/18 0158)   PRN Meds:.sodium chloride, acetaminophen **OR** acetaminophen, oxyCODONE-acetaminophen, polyethylene glycol, sodium chloride flush, sodium chloride flush   Assessment: Active Problems:   Severe anemia    Plan: Hemoglobin has stabilized Gastric ulcers treated with bipolar cauterization by Dr. Marius Ditch 2 days ago Continue PPI Continue serial CBCs Avoid NSAIDs Please page GI with any signs of active GI bleeding  GI service will sign off  Follow-up in GI clinic in 2 weeks after discharge   LOS: 4 days   Tanya Antigua, MD 06/24/2018, 12:00 PM

## 2018-06-24 NOTE — Progress Notes (Signed)
Patient off unit for toe amputation. Will resume care upon return. Wenda Low [facility]

## 2018-06-24 NOTE — Anesthesia Postprocedure Evaluation (Signed)
Anesthesia Post Note  Patient: [name]  Procedure(s) Performed: AMPUTATION GREAT TOE (Left )  Patient location during evaluation: PACU Anesthesia Type: General Level of consciousness: awake and alert Pain management: pain level controlled Vital Signs Assessment: post-procedure vital signs reviewed and stable Respiratory status: spontaneous breathing, nonlabored ventilation, respiratory function stable and patient connected to nasal cannula oxygen Cardiovascular status: blood pressure returned to baseline and stable Postop Assessment: no apparent nausea or vomiting Anesthetic complications: no     Last Vitals:  Vitals:   06/24/18 0930 06/24/18 0958  BP: 136/72 (!) 150/78  Pulse: 70 73  Resp: 15 17  Temp:  (!) 36.3 C  SpO2: 97% 97%    Last Pain:  Vitals:   06/24/18 0950  TempSrc:   PainSc: 0-No pain                 Precious Haws Debora [place]

## 2018-06-24 NOTE — Progress Notes (Signed)
Wound care orders for pre-existing wound have been discontinued pending new change orders from the podiatrist. Will continue to monitor. Wenda Low [facility]

## 2018-06-24 NOTE — Progress Notes (Signed)
Husband's phone number is apparently wrong. See yesterday's progress note r/t inability to contact family member for mandatory daily phone update. Will continue to monitor. Wenda Low [facility]

## 2018-06-24 NOTE — Anesthesia Post-op Follow-up Note (Signed)
Anesthesia QCDR form completed.        

## 2018-06-24 NOTE — Plan of Care (Signed)
  Problem: Education: Goal: Knowledge of General Education information will improve Description Including pain rating scale, medication(s)/side effects and non-pharmacologic comfort measures Outcome: Progressing   Problem: Clinical Measurements: Goal: Diagnostic test results will improve Outcome: Progressing Goal: Respiratory complications will improve Outcome: Progressing Goal: Cardiovascular complication will be avoided Outcome: Progressing   Problem: Coping: Goal: Level of anxiety will decrease Outcome: Progressing   Problem: Education: Goal: Knowledge of the prescribed therapeutic regimen will improve Outcome: Progressing

## 2018-06-24 NOTE — Evaluation (Signed)
Physical Therapy Evaluation Patient Details Name: [name] MRN: [id number] DOB: 07-15-1955 Today's Date: 06/24/2018   History of Present Illness  63 y.o. female with a known history of CHF, COPD, GI bleed September 2019, N STEMI, diabetes mellitus, history of cervical cancer. Pt came to ED not feeling well and having issues with blood glucose as well as L great toe pain.  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  Clinical Impression  Pt did relatively well with PT exam, but needed repeated cues to use post-op shoe, insure heel only WBing and use walker and use it appropriately.  Pt eager to work with PT but also needed cuing to insure that she does not try to over-do-it regarding time/distance of ambulation/WBing now and when she goes home.  Pt with some impulsivity but ultimately able to ambulate appropriately and safely with cuing.    Follow Up Recommendations Follow surgeon's recommendation for DC plan and follow-up therapies;Supervision for mobility/OOB(to insure post-op boot and appropriate WBing/walker use)    Equipment Recommendations  Rolling walker with 5" wheels;3in1 (PT)    Recommendations for Other Services       Precautions / Restrictions Precautions Precautions: Fall Required Braces or Orthoses: (post-op boot) Restrictions Weight Bearing Restrictions: Yes LLE Weight Bearing: Weight bearing as tolerated(heel WBing with post-op boot)      Mobility  Bed Mobility Overal bed mobility: Needs Assistance Bed Mobility: Supine to Sit;Sit to Supine     Supine to sit: Min guard Sit to supine: Min assist   General bed mobility comments: Pt able to get LEs of EOB to get to sitting, needed light assist to get LEs back into bed  Transfers Overall transfer level: Modified independent Equipment used: Rolling walker (2 wheeled)             General transfer comment: Pt was able to rise to standing w/o assist once PT cued appropraite U&LE positioning  and sequencing  Ambulation/Gait Ambulation/Gait assistance: Supervision Gait Distance (Feet): 40 Feet Assistive device: Rolling walker (2 wheeled)       General Gait Details: Pt with slow but relatively safe ambulation.  She was able to maintain heel only WBing with post-op boot but needed consistent cuing to position and WB though walker properly   Stairs            Wheelchair Mobility    Modified Rankin (Stroke Patients Only)       Balance Overall balance assessment: Modified Independent(reliant on walker in standing, no overt safety issues)                                           Pertinent Vitals/Pain Pain Assessment: 0-10 Pain Score: 8  Pain Location: L foot/toe    Home Living Family/patient expects to be discharged to:: Private residence Living Arrangements: Spouse/significant other(able to drive, run errands, perform ADLs (in w/c)) Available Help at Discharge: Family Type of Home: House Home Access: Ramped entrance     Home Layout: One level Home Equipment: Shower seat - built in      Prior Function Level of Independence: Needs assistance   Gait / Transfers Assistance Needed: Patient reports she does not have a walker at home and needs one. Does not have O2 at home per her report.     Comments: Pt reports that recently she has not been out of the house  much at all but does not need AD     Hand Dominance        Extremity/Trunk Assessment   Upper Extremity Assessment Upper Extremity Assessment: Generalized weakness    Lower Extremity Assessment Lower Extremity Assessment: Generalized weakness       Communication   Communication: No difficulties  Cognition Arousal/Alertness: Awake/alert Behavior During Therapy: WFL for tasks assessed/performed Overall Cognitive Status: Within Functional Limits for tasks assessed                                        General Comments      Exercises      Assessment/Plan    PT Assessment Patient needs continued PT services  PT Problem List Decreased strength;Decreased range of motion;Decreased activity tolerance;Decreased balance;Decreased mobility;Decreased cognition;Decreased knowledge of use of DME;Decreased safety awareness;Pain       PT Treatment Interventions DME instruction;Gait training;Stair training;Functional mobility training;Therapeutic activities;Therapeutic exercise;Balance training;Neuromuscular re-education;Patient/family education    PT Goals (Current goals can be found in the Care Plan section)  Acute Rehab PT Goals Patient Stated Goal: go home PT Goal Formulation: With patient Time For Goal Achievement: 07/08/18 Potential to Achieve Goals: Good    Frequency 7X/week   Barriers to discharge        Co-evaluation               AM-PAC PT "6 Clicks" Mobility  Outcome Measure Help needed turning from your back to your side while in a flat bed without using bedrails?: None Help needed moving from lying on your back to sitting on the side of a flat bed without using bedrails?: None Help needed moving to and from a bed to a chair (including a wheelchair)?: A Little Help needed standing up from a chair using your arms (e.g., wheelchair or bedside chair)?: A Little Help needed to walk in hospital room?: A Little Help needed climbing 3-5 steps with a railing? : A Lot 6 Click Score: 19    End of Session   Activity Tolerance: Patient limited by fatigue;Patient limited by pain;Patient tolerated treatment well Patient left: in bed;with call bell/phone within reach;with nursing/sitter in room Nurse Communication: Mobility status PT Visit Diagnosis: Muscle weakness (generalized) (M62.81);Difficulty in walking, not elsewhere classified (R26.2);Pain Pain - Right/Left: Left Pain - part of body: Ankle and joints of foot    Time: 1610-9604 PT Time Calculation (min) (ACUTE ONLY): 24 min   Charges:   PT  Evaluation $PT Eval Low Complexity: 1 Low PT Treatments $Therapeutic Activity: 8-22 mins        Kreg Shropshire, DPT 06/24/2018, 4:40 PM

## 2018-06-24 NOTE — Op Note (Signed)
Operative note   Surgeon:Syliva Mee Lawyer: None    Preop diagnosis: Osteomyelitis left great toe    Postop diagnosis: Same    Procedure: Amputation left great toe MTPJ    EBL: Minimal    Anesthesia:local and general.  Local consisted of 10 mL's of 0.5% bupivacaine and 7 mL's of lidocaine plain    Hemostasis: None    Specimen: Osteomyelitis left great toe for pathology and culture    Complications: None    Operative indications:[name] is an 63 y.o. that presents today for surgical intervention.  The risks/benefits/alternatives/complications have been discussed and consent has been given.    Procedure:  Patient was brought into the OR and placed on the operating table in thesupine position. After anesthesia was obtained theleft lower extremity was prepped and draped in usual sterile fashion.  Attention was directed to the left great toe where a fishmouth incision was created just distal to the MTPJ.  Full-thickness incision was taken down to the bone.  The toe was then disarticulated at the metatarsophalangeal joint and sent for pathological examination.  The wound was flushed with copious amounts of irrigation.  Minimal bleeding was noted intraoperatively.  The wound was then closed with a 3-0 nylon.  Bulky sterile dressing was then applied.    Patient tolerated the procedure and anesthesia well.  Was transported from the OR to the PACU with all vital signs stable and vascular status intact. To be discharged per routine protocol.  Will follow up in approximately 1 week in the outpatient clinic.

## 2018-06-24 NOTE — Progress Notes (Signed)
Re-applied R Prevalon boot at this time, as ordered continuously. Patient didn't want Prevalon boot re-applied to the new L-side surgical site. Will continue to monitor. Wenda Low [facility]

## 2018-06-24 NOTE — Progress Notes (Signed)
[place] at [place] NAME: [name]    MR#:  [id number]  DATE OF BIRTH:  Jan 08, 1956  SUBJECTIVE:   Chief Complaint  Patient presents with  . Hyperglycemia   Patient seen at the bedside s/p procedure. She reports no pain or other issues. Sitting up in bed eating breakfast.  REVIEW OF SYSTEMS:  Review of Systems  Constitutional: Positive for malaise/fatigue. Negative for chills, fever and weight loss.  HENT: Negative for congestion, hearing loss and sore throat.   Eyes: Negative for blurred vision and double vision.  Respiratory: Positive for shortness of breath. Negative for cough and wheezing.   Cardiovascular: Positive for leg swelling. Negative for chest pain, palpitations and orthopnea.  Gastrointestinal: Negative for abdominal pain, diarrhea, nausea and vomiting.  Genitourinary: Negative for dysuria and urgency.  Musculoskeletal: Negative for myalgias.  Skin: Negative for rash.       Diabetic foot ulcer  Neurological: Negative for dizziness, sensory change, speech change, focal weakness and headaches.  Psychiatric/Behavioral: Negative for depression.    DRUG ALLERGIES:   Allergies  Allergen Reactions  . Latex Anaphylaxis and Rash  . Gabapentin Nausea And Vomiting and Other (See Comments)   VITALS:  Blood pressure (!) 150/78, pulse 73, temperature (!) 97.4 F (36.3 C), resp. rate 17, height 5' 5"  (1.651 m), weight 87.6 kg, SpO2 97 %. PHYSICAL EXAMINATION:   GENERAL:  63 y.o.-year-old patient lying in the bed with no acute distress.  EYES: Pupils equal, round, reactive to light and accommodation. No scleral icterus. Extraocular muscles intact.  HEENT: Head atraumatic, normocephalic. Oropharynx and nasopharynx clear.  NECK:  Supple, no jugular venous distention. No thyroid enlargement, no tenderness.  LUNGS: Normal breath sounds bilaterally, no wheezing, rales,rhonchi or crepitation. No use of accessory muscles of  respiration.  CARDIOVASCULAR: S1, S2 normal. No murmurs, rubs, or gallops.  ABDOMEN: Soft, nontender, nondistended. Bowel sounds present. No organomegaly or mass.  EXTREMITIES:Bilateral pitting edema of lower extremities.  No cyanosis, or clubbing. No rash or lesions. + pedal pulses. Ace wrap to left foot MUSCULOSKELETAL: Normal bulk, and power was 5+ grip and elbow, knee, and ankle flexion and extension bilaterally.  NEUROLOGIC:Alert and oriented x 3. CN 2-12 intact. Sensation to light touch and cold stimuli intact bilaterally. Finger to nose nl. Babinski is downgoing. DTR's (biceps, patellar, and achilles) 2+ and symmetric throughout. Gait not tested due to safety concern. PSYCHIATRIC: The patient is alert and oriented x 3.  SKIN: Wound as below s/p post Amputation left great toe MTPJ         DATA REVIEWED:  LABORATORY PANEL:  Female CBC Recent Labs  Lab 06/24/18 0706  WBC 8.0  HGB 8.7*  HCT 29.2*  PLT 183   ------------------------------------------------------------------------------------------------------------------ Chemistries  Recent Labs  Lab 06/20/18 2038  06/24/18 0706  NA 135   < > 138  K 4.0   < > 3.9  CL 110   < > 109  CO2 17*   < > 21*  GLUCOSE 166*   < > 104*  BUN 26*   < > 24*  CREATININE 1.85*   < > 1.71*  CALCIUM 8.3*   < > 8.5*  AST 12*  --   --   ALT 8  --   --   ALKPHOS 94  --   --   BILITOT 0.4  --   --    < > = values in this interval not displayed.   RADIOLOGY:  No results found. ASSESSMENT AND PLAN:   63 y.o. female with history of CHF, COPD, GI bleed, NSTEMI, diabetes mellitus and cervical cancer presenting with increased weakness and fatigue x1 month.  1. GI Bleed -likely upper GI status post EGD 06/22/2018 showing was in gastric ulcers (class Ib) treated with bipolar cautery - Post PRBC transfusion 06/22/2018, hgb stable at 8.7 this am - IVF resuscitation to maintain MAP>65 - Continue to monitor H&H - Transfuse PRN Hgb<7 -  Pantoprazole 40 mg p.o. twice daily - Check  Helicobacter pylori Ab + IgG - Hold NSAIDs, steroids, ASA - GI Consult appreciated  2.  Iron deficiency anemia -secondary to chronic blood loss - Status post IV iron infusion 06/23/18  3.  Left great toe osteomyelitis - patient with history of diabetes mellitus, has had recent debridement 4/20 - X-ray shows osteomyelitis involving the base of the first distal phalanx and distal aspect of the first proximal phalanx - s/p Amputation left great toe MTPJ - Continue IV antibiotics with Ceftriaxone and Flagyl - ID consult  4. DM: sugars have been well-controlled on current regimen  - hemoglobin A1c 1 6/5 was 5.9 - Continue sliding scale insulin coverage in addition to Lantus - ADA 2100 calorie diet  - FS BS qac and qhs   5. 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  Patient has had bioprosthesis for aortic valve - Beta-Blockade: Carvedilol  - Furosemide 40 mg daily PO - Low salt diet   6. Chronic COPD  -evidence of exacerbation.  - Supplemental O2, goal sat 88-92% - Bronchodilators (albuterol/ipratropium) standing and PRN  - No risk factors for Pseudomonas  7. DVT prophylaxis - Hold anti-coagulation due to GI bleed    All the records are reviewed and case discussed with Care Management/Social Worker. Management plans discussed with the patient, family and they are in agreement.  CODE STATUS: Full Code  TOTAL TIME TAKING CARE OF THIS PATIENT: 38 minutes.   More than 50% of the time was spent in counseling/coordination of care: YES  POSSIBLE D/C IN 1 DAYS, DEPENDING ON CLINICAL CONDITION.   on 06/24/2018 at 11:23 AM  This patient was staffed with Dr. Valetta Fuller, [facility] who personally evaluated patient, reviewed documentation and agreed with assessment and plan of care as above.  Rufina Falco, DNP, FNP-BC [employer] [facility] Nurse Practitioner    Between 7am to 6pm - Pager [phone]  After 6pm go to [email or web address] - Proofreader  [facility]  Office  [phone]  CC: Primary care physician; Patient, No Pcp Per  Note: This dictation was prepared with Dragon dictation along with smaller phrase technology. Any transcriptional errors that result from this process are unintentional.

## 2018-06-24 NOTE — Transfer of Care (Signed)
Immediate Anesthesia Transfer of Care Note  Patient: [name]  Procedure(s) Performed: AMPUTATION GREAT TOE (Left )  Patient Location: PACU  Anesthesia Type:General  Level of Consciousness: awake, alert  and oriented  Airway & Oxygen Therapy: Patient Spontanous Breathing and Patient connected to face mask oxygen  Post-op Assessment: Report given to RN and Post -op Vital signs reviewed and stable  Post vital signs: Reviewed and stable  Last Vitals:  Vitals Value Taken Time  BP    Temp    Pulse    Resp    SpO2      Last Pain:  Vitals:   06/24/18 0417  TempSrc: Oral  PainSc:       Patients Stated Pain Goal: 3 (55/00/16 4290)  Complications: No apparent anesthesia complications

## 2018-06-24 NOTE — Anesthesia Procedure Notes (Signed)
Procedure Name: LMA Insertion Date/Time: 06/24/2018 8:12 AM Performed by: Chanetta Marshall, CRNA Pre-anesthesia Checklist: Patient identified, Emergency Drugs available, Suction available and Patient being monitored Patient Re-evaluated:Patient Re-evaluated prior to induction Oxygen Delivery Method: Circle system utilized Preoxygenation: Pre-oxygenation with 100% oxygen Induction Type: IV induction LMA: LMA inserted LMA Size: 4.0 Number of attempts: 1 Placement Confirmation: positive ETCO2,  CO2 detector and breath sounds checked- equal and bilateral Tube secured with: Tape Dental Injury: Teeth and Oropharynx as per pre-operative assessment

## 2018-06-25 ENCOUNTER — Other Ambulatory Visit (INDEPENDENT_AMBULATORY_CARE_PROVIDER_SITE_OTHER): Payer: Self-pay | Admitting: Vascular Surgery

## 2018-06-25 ENCOUNTER — Encounter: Payer: Self-pay | Admitting: Podiatry

## 2018-06-25 DIAGNOSIS — I110 Hypertensive heart disease with heart failure: Secondary | ICD-10-CM

## 2018-06-25 DIAGNOSIS — Z888 Allergy status to other drugs, medicaments and biological substances status: Secondary | ICD-10-CM

## 2018-06-25 DIAGNOSIS — Z951 Presence of aortocoronary bypass graft: Secondary | ICD-10-CM

## 2018-06-25 DIAGNOSIS — Z9889 Other specified postprocedural states: Secondary | ICD-10-CM

## 2018-06-25 DIAGNOSIS — Z952 Presence of prosthetic heart valve: Secondary | ICD-10-CM

## 2018-06-25 DIAGNOSIS — Z89412 Acquired absence of left great toe: Secondary | ICD-10-CM

## 2018-06-25 DIAGNOSIS — D500 Iron deficiency anemia secondary to blood loss (chronic): Secondary | ICD-10-CM

## 2018-06-25 DIAGNOSIS — E11621 Type 2 diabetes mellitus with foot ulcer: Secondary | ICD-10-CM

## 2018-06-25 DIAGNOSIS — Z8719 Personal history of other diseases of the digestive system: Secondary | ICD-10-CM

## 2018-06-25 DIAGNOSIS — E1169 Type 2 diabetes mellitus with other specified complication: Secondary | ICD-10-CM

## 2018-06-25 DIAGNOSIS — Q2733 Arteriovenous malformation of digestive system vessel: Secondary | ICD-10-CM

## 2018-06-25 DIAGNOSIS — I509 Heart failure, unspecified: Secondary | ICD-10-CM

## 2018-06-25 DIAGNOSIS — L97529 Non-pressure chronic ulcer of other part of left foot with unspecified severity: Secondary | ICD-10-CM

## 2018-06-25 DIAGNOSIS — I255 Ischemic cardiomyopathy: Secondary | ICD-10-CM

## 2018-06-25 DIAGNOSIS — Z91040 Latex allergy status: Secondary | ICD-10-CM

## 2018-06-25 DIAGNOSIS — E1152 Type 2 diabetes mellitus with diabetic peripheral angiopathy with gangrene: Secondary | ICD-10-CM

## 2018-06-25 DIAGNOSIS — I96 Gangrene, not elsewhere classified: Secondary | ICD-10-CM

## 2018-06-25 DIAGNOSIS — M869 Osteomyelitis, unspecified: Secondary | ICD-10-CM

## 2018-06-25 DIAGNOSIS — I2510 Atherosclerotic heart disease of native coronary artery without angina pectoris: Secondary | ICD-10-CM

## 2018-06-25 LAB — BASIC METABOLIC PANEL
Anion gap: 5 (ref 5–15)
BUN: 27 mg/dL — ABNORMAL HIGH (ref 8–23)
CO2: 25 mmol/L (ref 22–32)
Calcium: 8.3 mg/dL — ABNORMAL LOW (ref 8.9–10.3)
Chloride: 108 mmol/L (ref 98–111)
Creatinine, Ser: 1.65 mg/dL — ABNORMAL HIGH (ref 0.44–1.00)
GFR calc Af Amer: 38 mL/min — ABNORMAL LOW (ref >60)
GFR calc non Af Amer: 33 mL/min — ABNORMAL LOW (ref >60)
Glucose, Bld: 103 mg/dL — ABNORMAL HIGH (ref 70–99)
Potassium: 4.2 mmol/L (ref 3.5–5.1)
Sodium: 138 mmol/L (ref 135–145)

## 2018-06-25 LAB — GLUCOSE, CAPILLARY
Glucose-Capillary: 86 mg/dL (ref 70–99)
Glucose-Capillary: 149 mg/dL — ABNORMAL HIGH (ref 70–99)
Glucose-Capillary: 98 mg/dL (ref 70–99)
Glucose-Capillary: 161 mg/dL — ABNORMAL HIGH (ref 70–99)

## 2018-06-25 LAB — CBC WITH DIFFERENTIAL/PLATELET
Abs Immature Granulocytes: 0.04 10*3/uL (ref 0.00–0.07)
Basophils Absolute: 0.1 10*3/uL (ref 0.0–0.1)
Basophils Relative: 1 %
Eosinophils Absolute: 0.2 10*3/uL (ref 0.0–0.5)
Eosinophils Relative: 2 %
HCT: 28.8 % — ABNORMAL LOW (ref 36.0–46.0)
Hemoglobin: 8.5 g/dL — ABNORMAL LOW (ref 12.0–15.0)
Immature Granulocytes: 0 %
Lymphocytes Relative: 14 %
Lymphs Abs: 1.3 10*3/uL (ref 0.7–4.0)
MCH: 25 pg — ABNORMAL LOW (ref 26.0–34.0)
MCHC: 29.5 g/dL — ABNORMAL LOW (ref 30.0–36.0)
MCV: 84.7 fL (ref 80.0–100.0)
Monocytes Absolute: 0.7 10*3/uL (ref 0.1–1.0)
Monocytes Relative: 8 %
Neutro Abs: 6.8 10*3/uL (ref 1.7–7.7)
Neutrophils Relative %: 75 %
Platelets: 177 10*3/uL (ref 150–400)
RBC: 3.4 MIL/uL — ABNORMAL LOW (ref 3.87–5.11)
RDW: 24.1 % — ABNORMAL HIGH (ref 11.5–15.5)
Smear Review: NORMAL
WBC: 9 10*3/uL (ref 4.0–10.5)
nRBC: 0.4 % — ABNORMAL HIGH (ref 0.0–0.2)

## 2018-06-25 MED ORDER — CEFAZOLIN SODIUM-DEXTROSE 2-4 GM/100ML-% IV SOLN
2.0000 g | Freq: Once | INTRAVENOUS | Status: AC
  Administered 2018-06-26: 2 g via INTRAVENOUS
  Filled 2018-06-25: qty 100

## 2018-06-25 MED ORDER — SODIUM CHLORIDE 0.9 % IV SOLN
INTRAVENOUS | Status: DC
  Administered 2018-06-26: 01:00:00 via INTRAVENOUS

## 2018-06-25 MED ORDER — PANTOPRAZOLE SODIUM 40 MG PO TBEC
40.0000 mg | DELAYED_RELEASE_TABLET | Freq: Two times a day (BID) | ORAL | Status: DC
  Administered 2018-06-25 – 2018-06-27 (×3): 40 mg via ORAL
  Filled 2018-06-25 (×4): qty 1

## 2018-06-25 NOTE — Progress Notes (Signed)
[place] at [place] NAME: [name]    MR#:  [id number]  DATE OF BIRTH:  [birth year]  SUBJECTIVE:   Chief Complaint  Patient presents with  . Hyperglycemia   Patient seen at the bedside s/p amputation POD # 1. She reports no pain or other issues. Sitting up in bed eating breakfast.  REVIEW OF SYSTEMS:  Review of Systems  Constitutional: Positive for malaise/fatigue. Negative for chills, fever and weight loss.  HENT: Negative for congestion, hearing loss and sore throat.   Eyes: Negative for blurred vision and double vision.  Respiratory: Positive for shortness of breath. Negative for cough and wheezing.   Cardiovascular: Positive for leg swelling. Negative for chest pain, palpitations and orthopnea.  Gastrointestinal: Negative for abdominal pain, diarrhea, nausea and vomiting.  Genitourinary: Negative for dysuria and urgency.  Musculoskeletal: Negative for myalgias.  Skin: Negative for rash.       Diabetic foot ulcer  Neurological: Negative for dizziness, sensory change, speech change, focal weakness and headaches.  Psychiatric/Behavioral: Negative for depression.    DRUG ALLERGIES:   Allergies  Allergen Reactions  . Latex Anaphylaxis and Rash  . Gabapentin Nausea And Vomiting and Other (See Comments)   VITALS:  Blood pressure 133/70, pulse 69, temperature 97.7 F (36.5 C), temperature source Oral, resp. rate 17, height 5' 5"  (1.651 m), weight 87.6 kg, SpO2 100 %. PHYSICAL EXAMINATION:   GENERAL:  63 y.o.-year-old patient lying in the bed with no acute distress.  EYES: Pupils equal, round, reactive to light and accommodation. No scleral icterus. Extraocular muscles intact.  HEENT: Head atraumatic, normocephalic. Oropharynx and nasopharynx clear.  NECK:  Supple, no jugular venous distention. No thyroid enlargement, no tenderness.  LUNGS: Normal breath sounds bilaterally, no wheezing, rales,rhonchi or crepitation. No use  of accessory muscles of respiration.  CARDIOVASCULAR: S1, S2 normal. No murmurs, rubs, or gallops.  ABDOMEN: Soft, nontender, nondistended. Bowel sounds present. No organomegaly or mass.  EXTREMITIES:Bilateral pitting edema of lower extremities.  No cyanosis, or clubbing. No rash or lesions. + pedal pulses. Ace wrap to left foot MUSCULOSKELETAL: Normal bulk, and power was 5+ grip and elbow, knee, and ankle flexion and extension bilaterally.  NEUROLOGIC:Alert and oriented x 3. CN 2-12 intact. Sensation to light touch and cold stimuli intact bilaterally. Finger to nose nl. Babinski is downgoing. DTR's (biceps, patellar, and achilles) 2+ and symmetric throughout. Gait not tested due to safety concern. PSYCHIATRIC: The patient is alert and oriented x 3.  SKIN: Wound as below s/p post Amputation left great toe MTPJ         DATA REVIEWED:  LABORATORY PANEL:  Female CBC Recent Labs  Lab 06/25/18 0540  WBC 9.0  HGB 8.5*  HCT 28.8*  PLT 177   ------------------------------------------------------------------------------------------------------------------ Chemistries  Recent Labs  Lab 06/20/18 2038  06/25/18 0540  NA 135   < > 138  K 4.0   < > 4.2  CL 110   < > 108  CO2 17*   < > 25  GLUCOSE 166*   < > 103*  BUN 26*   < > 27*  CREATININE 1.85*   < > 1.65*  CALCIUM 8.3*   < > 8.3*  AST 12*  --   --   ALT 8  --   --   ALKPHOS 94  --   --   BILITOT 0.4  --   --    < > = values in this interval not  displayed.   RADIOLOGY:  No results found. ASSESSMENT AND PLAN:   63 y.o. female with history of CHF, COPD, GI bleed, NSTEMI, diabetes mellitus and cervical cancer presenting with increased weakness and fatigue x1 month.  1. GI Bleed -likely upper GI status post EGD 06/22/2018 showing was in gastric ulcers (class Ib) treated with bipolar cautery - Post PRBC transfusion 06/22/2018, hgb stable at 8.5 this am - IVF resuscitation to maintain MAP>65 - Continue to monitor H&H - Transfuse  PRN Hgb<7 - Pantoprazole 40 mg p.o. twice daily - Hold NSAIDs, steroids, ASA - GI Consult appreciated  2.  Iron deficiency anemia -secondary to chronic blood loss - Status post IV iron infusion 06/23/18  3.  Left great toe osteomyelitis - patient with history of diabetes mellitus, has had recent debridement 4/20 - X-ray shows osteomyelitis involving the base of the first distal phalanx and distal aspect of the first proximal phalanx - s/p Amputation left great toe MTPJ POD # 1 - Continue IV antibiotics with Ceftriaxone and Flagyl - Left Lower Extremity Angiogram with Dr. Delana Meyer tomorrow (Tues). - ID consult  4. DM: sugars have been well-controlled on current regimen  - hemoglobin A1c 1 6/5 was 5.9 - Continue sliding scale insulin coverage in addition to Lantus - ADA 2100 calorie diet  - FS BS qac and qhs   5. 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  Patient has had bioprosthesis for aortic valve - Beta-Blockade: Carvedilol  - Furosemide 40 mg daily PO - Low salt diet   6. Chronic COPD  -evidence of exacerbation.  - Supplemental O2, goal sat 88-92% - Bronchodilators (albuterol/ipratropium) standing and PRN  - No risk factors for Pseudomonas  7. DVT prophylaxis - Hold anti-coagulation due to GI bleed    All the records are reviewed and case discussed with Care Management/Social Worker. Management plans discussed with the patient, family and they are in agreement.  CODE STATUS: Full Code  TOTAL TIME TAKING CARE OF THIS PATIENT: 38 minutes.   More than 50% of the time was spent in counseling/coordination of care: YES  POSSIBLE D/C IN 1 DAYS, DEPENDING ON CLINICAL CONDITION.   on 06/25/2018 at 9:34 AM  This patient was staffed with Dr. Valetta Fuller, [facility] who personally evaluated patient, reviewed documentation and agreed with assessment and plan of care as above.  Rufina Falco,  DNP, FNP-BC [facility] Nurse Practitioner   Between 7am to 6pm - Pager [phone]  After 6pm go to [email or web address] - Proofreader  [facility]  Office  [phone]  CC: Primary care physician; Patient, No Pcp Per  Note: This dictation was prepared with Dragon dictation along with smaller phrase technology. Any transcriptional errors that result from this process are unintentional.

## 2018-06-25 NOTE — Consult Note (Signed)
NAME: [name]  DOB: 03/26/1955  MRN: [id number]  Date/Time: 06/25/2018 11:06 AM  REQUESTING PROVIDER: Dr.Mayo Subjective:  REASON FOR CONSULT: Left toe gangrene ? [facility] medical records reviewed, chart reviewed, patient is a poor historian [name] is a 63 y.o. 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 Presented to the ED on 06/20/2018 with not feeling well, fatigue, gangrene of the left great toe.  Patient has had a ulcer on the great toe for the last few months.  She has not seen a podiatrist in regard to this.  In the ED her vitals were temperature of 98.4 BP of 131/66, respiratory rate of 18, and pulse rate of 93. In the ED she had a hemoglobin of 3.8, WBC of 7.8 and platelet of 179.  She had a creatinine of 1.85. She was transfused 3 units of blood. Seen by Dr. Vickki Muff who took her for surgery on 07/04/2018 and she underwent amputation of the left great toe at the MTP joint. Patient is currently on ceftriaxone and metronidazole.  I am asked to see the patient for the toe infection. 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 cardiac MRI done at [facility] on 02/09/18 r/o infiltrative pathology MR TR Acute on chronic diastolic CHF Coronary artery disease Chronic hepatitis C Hyperlipidemia Diabetes mellitus type 2 Diabetic polyneuropathy Hypertension GERD Chronic back pain Anxiety/insomnia Carpal tunnel syndrome COPD Cervical pain with radiculopathy affecting upper extremity Sleep apnea Cervical cancer   surgical history CABG Appendectomy ORIF ankle fracture right Carpal tunnel repair Tubal ligation Cholecystectomy Coronary angiography  Social history  non-smoker No alcohol  Family History  Problem  Relation Age of Onset   Hypertension Mother    Diabetes Mellitus II Mother    Breast cancer Mother    Heart disease Father    Cancer Sister    Allergies  Allergen Reactions   Latex Anaphylaxis and Rash   Gabapentin Nausea And Vomiting and Other (See Comments)    ? Current Facility-Administered Medications  Medication Dose Route Frequency Provider Last Rate Last Dose   0.9 %  sodium chloride infusion  250 mL Intravenous PRN Samara Deist, DPM 10 mL/hr at 06/25/18 0602 250 mL at 06/25/18 0602   [START ON 06/26/2018] 0.9 %  sodium chloride infusion   Intravenous Continuous Stegmayer, Kimberly A, PA-C       acetaminophen (TYLENOL) tablet 650 mg  650 mg Oral Q6H PRN Samara Deist, DPM       Or   acetaminophen (TYLENOL) suppository 650 mg  650 mg Rectal Q6H PRN Samara Deist, DPM       atorvastatin (LIPITOR) tablet 40 mg  40 mg Oral Daily Samara Deist, DPM   40 mg at 06/24/18 1721   carvedilol (COREG) tablet 3.125 mg  3.125 mg Oral BID WC Samara Deist, DPM   3.125 mg at 06/25/18 1009   cefTRIAXone (ROCEPHIN) 1 g in sodium chloride 0.9 % 100 mL IVPB  1 g Intravenous Q24H Samara Deist, DPM 200 mL/hr at 06/24/18 2340 1 g at 06/24/18 2340   Chlorhexidine Gluconate Cloth 2 % PADS 6 each  6 each Topical Q0600 Samara Deist, DPM   6 each at 96/29/52 8413   folic acid (FOLVITE) tablet 1 mg  1 mg Oral Daily Vickki Muff,  Larkin Ina, DPM   1 mg at 06/25/18 1009   furosemide (LASIX) tablet 40 mg  40 mg Oral Daily Samara Deist, DPM   40 mg at 06/25/18 1009   insulin aspart (novoLOG) injection 0-5 Units  0-5 Units Subcutaneous QHS Samara Deist, DPM       insulin glargine (LANTUS) injection 15 Units  15 Units Subcutaneous Daily Samara Deist, DPM   15 Units at 06/25/18 1010   metroNIDAZOLE (FLAGYL) IVPB 500 mg  500 mg Intravenous Q8H Samara Deist, DPM 100 mL/hr at 06/25/18 0602 500 mg at 06/25/18 0602   mometasone-formoterol (DULERA) 200-5 MCG/ACT inhaler 2 puff  2 puff Inhalation  BID Samara Deist, DPM   2 puff at 06/25/18 1008   multivitamin with minerals tablet 1 tablet  1 tablet Oral Daily Samara Deist, DPM   1 tablet at 06/25/18 1009   mupirocin ointment (BACTROBAN) 2 % 1 application  1 application Nasal BID Samara Deist, DPM   1 application at 60/10/93 1010   oxyCODONE-acetaminophen (PERCOCET/ROXICET) 5-325 MG per tablet 1-2 tablet  1-2 tablet Oral Q6H PRN Samara Deist, DPM   2 tablet at 06/25/18 1021   pantoprazole (PROTONIX) injection 40 mg  40 mg Intravenous Q12H Samara Deist, DPM   40 mg at 06/25/18 1009   polyethylene glycol (MIRALAX / GLYCOLAX) packet 17 g  17 g Oral Daily PRN Samara Deist, DPM       sodium chloride flush (NS) 0.9 % injection 10-40 mL  10-40 mL Intracatheter Q12H Samara Deist, DPM   10 mL at 06/24/18 2247   sodium chloride flush (NS) 0.9 % injection 10-40 mL  10-40 mL Intracatheter PRN Samara Deist, DPM       sodium chloride flush (NS) 0.9 % injection 3 mL  3 mL Intravenous Q12H Samara Deist, DPM   3 mL at 06/25/18 1010   sodium chloride flush (NS) 0.9 % injection 3 mL  3 mL Intravenous PRN Samara Deist, DPM       thiamine (VITAMIN B-1) tablet 100 mg  100 mg Oral Daily Samara Deist, DPM   100 mg at 06/25/18 1009     Abtx:  Anti-infectives (From admission, onward)   Start     Dose/Rate Route Frequency Ordered Stop   06/20/18 2315  cefTRIAXone (ROCEPHIN) 1 g in sodium chloride 0.9 % 100 mL IVPB     1 g 200 mL/hr over 30 Minutes Intravenous Every 24 hours 06/20/18 2312     06/20/18 2315  metroNIDAZOLE (FLAGYL) IVPB 500 mg     500 mg 100 mL/hr over 60 Minutes Intravenous Every 8 hours 06/20/18 2312        REVIEW OF SYSTEMS:  Const: negative fever,  chills, negative weight loss Eyes: negative diplopia or visual changes, negative eye pain ENT: negative coryza, negative sore throat Resp: negative cough, hemoptysis, has dyspnea Cards: negative for chest pain, has palpitations, has lower extremity edema GU:  negative for frequency, dysuria and hematuria GI: Negative for abdominal pain, diarrhea, bleeding, constipation Skin: negative for rash and pruritus Heme: negative for easy bruising and gum/nose bleeding MS: muscle weakness , fatigue Neurolo: headaches, dizziness,  memory problems  Psych: negative for feelings of anxiety, depression  Endocrine: negative for thyroid, diabetes Allergy/Immunology- negative for any medication or food allergies ?  Objective:  VITALS:  BP 133/70 (BP Location: Left Arm)    Pulse 69    Temp 97.7 F (36.5 C) (Oral)    Resp 17    Ht 5' 5"  (1.651  m)    Wt 87.6 kg    SpO2 100%    BMI 32.13 kg/m  PHYSICAL EXAM:  General: Awake, chronically ill, oriented in place and person Head: Normocephalic, without obvious abnormality, atraumatic. Eyes: Conjunctivae clear, anicteric sclerae. Pupils are equal ENT Nares normal. No drainage or sinus tenderness. Lips, mucosa, and tongue normal. No Thrush Neck: Supple, symmetrical, no adenopathy, thyroid: non tender no carotid bruit and no JVD. Back: No CVA tenderness. Lungs:b/l air entry Heart: s1s2 Abdomen: could not be examined Extremities:      surgical dressing from left foot not removed Skin: limited examination Neurologic: Grossly non-focal Pertinent Labs Lab Results CBC    Component Value Date/Time   WBC 9.0 06/25/2018 0540   RBC 3.40 (L) 06/25/2018 0540   HGB 8.5 (L) 06/25/2018 0540   HGB 7.3 (L) 11/23/2017 1454   HCT 28.8 (L) 06/25/2018 0540   HCT 45.7 08/03/2013 2138   PLT 177 06/25/2018 0540   PLT 118 (L) 08/03/2013 2138   MCV 84.7 06/25/2018 0540   MCV 89 08/03/2013 2138   MCH 25.0 (L) 06/25/2018 0540   MCHC 29.5 (L) 06/25/2018 0540   RDW 24.1 (H) 06/25/2018 0540   RDW 13.6 08/03/2013 2138   LYMPHSABS 1.3 06/25/2018 0540   LYMPHSABS 3.4 08/03/2013 2138   MONOABS 0.7 06/25/2018 0540   MONOABS 0.7 08/03/2013 2138   EOSABS 0.2 06/25/2018 0540   EOSABS 0.3 08/03/2013 2138   BASOSABS 0.1  06/25/2018 0540   BASOSABS 0.1 08/03/2013 2138    CMP Latest Ref Rng & Units 06/25/2018 06/24/2018 06/21/2018  Glucose 70 - 99 mg/dL 103(H) 104(H) 120(H)  BUN 8 - 23 mg/dL 27(H) 24(H) 24(H)  Creatinine 0.44 - 1.00 mg/dL 1.65(H) 1.71(H) 1.75(H)  Sodium 135 - 145 mmol/L 138 138 134(L)  Potassium 3.5 - 5.1 mmol/L 4.2 3.9 4.1  Chloride 98 - 111 mmol/L 108 109 105  CO2 22 - 32 mmol/L 25 21(L) 18(L)  Calcium 8.9 - 10.3 mg/dL 8.3(L) 8.5(L) 8.4(L)  Total Protein 6.5 - 8.1 g/dL - - -  Total Bilirubin 0.3 - 1.2 mg/dL - - -  Alkaline Phos 38 - 126 U/L - - -  AST 15 - 41 U/L - - -  ALT 0 - 44 U/L - - -      Microbiology: Recent Results (from the past 240 hour(s))  SARS Coronavirus 2 (CEPHEID- Performed in [place] hospital lab), Hosp Order     Status: None   Collection Time: 06/20/18  9:40 PM  Result Value Ref Range Status   SARS Coronavirus 2 NEGATIVE NEGATIVE Final    Comment: (NOTE) If result is NEGATIVE SARS-CoV-2 target nucleic acids are NOT DETECTED. The SARS-CoV-2 RNA is generally detectable in upper and lower  respiratory specimens during the acute phase of infection. The lowest  concentration of SARS-CoV-2 viral copies this assay can detect is 250  copies / mL. A negative result does not preclude SARS-CoV-2 infection  and should not be used as the sole basis for treatment or other  patient management decisions.  A negative result may occur with  improper specimen collection / handling, submission of specimen other  than nasopharyngeal swab, presence of viral mutation(s) within the  areas targeted by this assay, and inadequate number of viral copies  (<250 copies / mL). A negative result must be combined with clinical  observations, patient history, and epidemiological information. If result is POSITIVE SARS-CoV-2 target nucleic acids are DETECTED. The SARS-CoV-2 RNA is generally detectable in  upper and lower  respiratory specimens dur ing the acute phase of infection.   Positive  results are indicative of active infection with SARS-CoV-2.  Clinical  correlation with patient history and other diagnostic information is  necessary to determine patient infection status.  Positive results do  not rule out bacterial infection or co-infection with other viruses. If result is PRESUMPTIVE POSTIVE SARS-CoV-2 nucleic acids MAY BE PRESENT.   A presumptive positive result was obtained on the submitted specimen  and confirmed on repeat testing.  While 2019 novel coronavirus  (SARS-CoV-2) nucleic acids may be present in the submitted sample  additional confirmatory testing may be necessary for epidemiological  and / or clinical management purposes  to differentiate between  SARS-CoV-2 and other Sarbecovirus currently known to infect humans.  If clinically indicated additional testing with an alternate test  methodology [phone]) is advised. The SARS-CoV-2 RNA is generally  detectable in upper and lower respiratory sp ecimens during the acute  phase of infection. The expected result is Negative. Fact Sheet for Patients:  StrictlyIdeas.no Fact Sheet for Healthcare Providers: BankingDealers.co.za This test is not yet approved or cleared by the [place] FDA and has been authorized for detection and/or diagnosis of SARS-CoV-2 by FDA under an Emergency Use Authorization (EUA).  This EUA will remain in effect (meaning this test can be used) for the duration of the COVID-19 declaration under Section 564(b)(1) of the Act, 21 U.S.C. section 360bbb-3(b)(1), unless the authorization is terminated or revoked sooner. Performed at [facility], [address] [place], [place] [address]   CULTURE, BLOOD (ROUTINE X 2) w Reflex to ID Panel     Status: None (Preliminary result)   Collection Time: 06/21/18  2:17 AM  Result Value Ref Range Status   Specimen Description BLOOD BLOOD RIGHT HAND  Final   Special Requests    Final    BOTTLES DRAWN AEROBIC AND ANAEROBIC Blood Culture adequate volume   Culture   Final    NO GROWTH 4 DAYS Performed at [facility], [address], [place], [place] [address]    Report Status PENDING  Incomplete  CULTURE, BLOOD (ROUTINE X 2) w Reflex to ID Panel     Status: None (Preliminary result)   Collection Time: 06/21/18 12:44 PM  Result Value Ref Range Status   Specimen Description   Final    BLOOD BLOOD LEFT ARM Performed at [facility], [address] [place], [place] [address]    Special Requests   Final    BOTTLES DRAWN AEROBIC AND ANAEROBIC Blood Culture results may not be optimal due to an excessive volume of blood received in culture bottles Performed at [facility], [address] [place], [place] [address]    Culture   Final    NO GROWTH 4 DAYS Performed at [facility], [address] [place],  [address]    Report Status PENDING  Incomplete  MRSA PCR Screening     Status: Abnormal   Collection Time: 06/22/18 11:42 AM  Result Value Ref Range Status   MRSA by PCR POSITIVE (A) NEGATIVE Final    Comment:        The GeneXpert MRSA Assay (FDA approved for NASAL specimens only), is one component of a comprehensive MRSA colonization surveillance program. It is not intended to diagnose MRSA infection nor to guide or monitor treatment for MRSA infections. RESULT CALLED TO, READ BACK BY AND VERIFIED WITH: JANCY JOHNSON @1406  ON 06/22/2018 BY  FMW Performed at [facility], [address] [place], [place] [address]   Aerobic/Anaerobic Culture (surgical/deep wound)     Status: None (Preliminary result)   Collection Time: 06/24/18  8:39 AM  Result Value Ref Range Status   Specimen Description   Final    WOUND BONE LEFT GREAT TOE Performed at [facility], [address], [place], [place] [address]    Special Requests   Final    NONE Performed at [facility], [place], [place] [address]    Gram Stain   Final    MODERATE WBC PRESENT, PREDOMINANTLY PMN FEW GRAM POSITIVE COCCI Performed at [facility], [place] [address] [place], [place] [address]    Culture PENDING  Incomplete   Report Status PENDING  Incomplete    IMAGING RESULTS:  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 I have personally reviewed the films ? Impression/Recommendation ?63 y.o. 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 Presented to the ED on 06/20/2018 with not feeling well, fatigue, gangrene of the left great toe.  Patient has had a ulcer on the great toe for the last few months.  She has not seen a podiatrist in regard to this.  In the ED her vitals were temperature of 98.4 BP of 131/66, respiratory rate of 18, and pulse rate of 93. In the ED she had a hemoglobin of 3.8, WBC of 7.8 and platelet of 179.  She had a creatinine of 1.85. She was transfused 3 units of blood.  ? Left great toe gangrene- S/p amputation Also has ischemia of the 4th toe and another area on the plantar surface . She is getting angio tomorrow.  Is this vasculitis??  Embolic manifestation?? Valve vegetation -blood culture negative Continue ceftriaxone and flagyl until we get the results back Will see the aptient with Dr.Fowler tomorrow  Milton Ferguson microcytic anemia-chronic blood closs s/p iron transfusion also received PRBC on admisison ? __Gastric ulcers causing GI bleed - treated with cautery  DM   Bioprosthetic aortic valve replacement  CAD S/p CABG  CHF- management as per primary team  _________________________________________________ Discussed with patient and her nurse

## 2018-06-25 NOTE — Plan of Care (Signed)
  Problem: Pain Managment: Goal: General experience of comfort will improve Outcome: Progressing   Problem: Safety: Goal: Ability to remain free from injury will improve Outcome: Progressing   Problem: Skin Integrity: Goal: Risk for impaired skin integrity will decrease Outcome: Progressing   Problem: Education: Goal: Understanding of discharge needs will improve Outcome: Progressing   Problem: Activity: Goal: Ability to perform//tolerate increased activity and mobilize with assistive devices will improve Outcome: Progressing   Problem: Self-Care: Goal: Ability to meet self-care needs will improve Outcome: Progressing

## 2018-06-25 NOTE — Plan of Care (Signed)

## 2018-06-25 NOTE — Progress Notes (Signed)
Attempted to call patient's husband to update him on patient's progress. No answer. Will attempt again tomorrow.  Hyman Bible, MD

## 2018-06-25 NOTE — Progress Notes (Addendum)
[facility]   Communication Note 1) Will plan on Left Lower Extremity Angiogram With Dr. Delana Meyer tomorrow (Tues). 2) Will pre-op.  Discussed the procedure again with patient this afternoon.  Discussed the benefits and the risks.  We discussed the pathophysiology of atherosclerotic disease in relation to wound formation/healing.  All questions answered.  The patient wishes to proceed for tomorrow's left lower extremity angiogram with possible intervention.  Discussed with Tamera Stands PA-C 06/25/2018 9:27 AM

## 2018-06-25 NOTE — Progress Notes (Signed)
Physical Therapy Treatment Patient Details Name: [name] MRN: [id number] DOB: October 28, 1955 Today's Date: 06/25/2018    History of Present Illness 63 y.o. female with a known history of CHF, COPD, GI bleed September 2019, N STEMI, diabetes mellitus, history of cervical cancer. Pt came to ED not feeling well and having issues with blood glucose as well as L great toe pain.  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    PT Comments    Pt reporting 7/10 L foot/toe pain beginning and end of session at rest (nurse notified of pt's request for pain meds).  Pt modified independent with transfers and SBA with short ambulation with walker in pt's room (limited distance per activity restrictions); initial cueing for WB'ing precautions and walker use.  Overall pt steady and safe with functional mobility during sessions activities.  Tolerated LE ex's well.  Will continue to focus on strengthening, balance, and functional mobility during hospital stay.   Follow Up Recommendations  Follow surgeon's recommendation for DC plan and follow-up therapies;Supervision for mobility/OOB     Equipment Recommendations  Rolling walker with 5" wheels;3in1 (PT)    Recommendations for Other Services       Precautions / Restrictions Precautions Precautions: Fall Restrictions Weight Bearing Restrictions: Yes Other Position/Activity Restrictions: WB as tolerated to heel (s/p L great toe amp); L post op shoe    Mobility  Bed Mobility Overal bed mobility: Modified Independent Bed Mobility: Sit to Supine       Sit to supine: Modified independent (Device/Increase time)   General bed mobility comments: no assist required; able to reposition self in bed without any assist  Transfers Overall transfer level: Modified independent Equipment used: Rolling walker (2 wheeled)             General transfer comment: mild increased time to figure out positioning on own but then able to stand  safely on own with fairly strong stand noted  Ambulation/Gait Ambulation/Gait assistance: Supervision Gait Distance (Feet): 30 Feet Assistive device: Rolling walker (2 wheeled)   Gait velocity: decreased   General Gait Details: initial vc's for technique (Heel WB'ing; walker use/positioning) and then pt able to perform safely on own; step to gait pattern   Stairs Stairs: (pt has ramp to enter home)           Wheelchair Mobility    Modified Rankin (Stroke Patients Only)       Balance Overall balance assessment: Needs assistance Sitting-balance support: No upper extremity supported;Feet supported Sitting balance-Leahy Scale: Normal Sitting balance - Comments: steady sitting reaching outside BOS   Standing balance support: No upper extremity supported Standing balance-Leahy Scale: Good Standing balance comment: steady standing reaching within BOS                            Cognition Arousal/Alertness: Awake/alert Behavior During Therapy: WFL for tasks assessed/performed Overall Cognitive Status: Within Functional Limits for tasks assessed                                        Exercises General Exercises - Lower Extremity Ankle Circles/Pumps: AROM;Strengthening;Both;10 reps;Supine Quad Sets: AROM;Strengthening;Both;10 reps;Supine Gluteal Sets: AROM;Strengthening;Both;10 reps;Supine Short Arc Quad: AROM;Strengthening;Both;10 reps;Supine Long Arc Quad: AROM;Strengthening;Both;10 reps;Seated Heel Slides: AROM;Strengthening;Both;10 reps;Supine Hip ABduction/ADduction: AROM;Strengthening;Both;10 reps;Supine Straight Leg Raises: AROM;Strengthening;Both;10 reps;Supine(minimal assist initially with L LE) Hip Flexion/Marching: AROM;Strengthening;Both;10 reps;Seated  General Comments General comments (skin integrity, edema, etc.): Darker red drainage noted L great toe dressing area prior to ambulation (appearing older in appearance); no change  in drainage noted post ambulation.  Nursing cleared pt for participation in physical therapy.  Pt agreeable to PT session (pt sitting on edge of bed upon PT entering room).      Pertinent Vitals/Pain Pain Assessment: 0-10 Pain Score: 7  Pain Location: L foot/toe Pain Descriptors / Indicators: Sore Pain Intervention(s): Limited activity within patient's tolerance;Monitored during session;Repositioned;Patient requesting pain meds-RN notified  Vitals (HR and O2 on room air) stable and WFL throughout treatment session.    Home Living                      Prior Function            PT Goals (current goals can now be found in the care plan section) Acute Rehab PT Goals Patient Stated Goal: go home PT Goal Formulation: With patient Time For Goal Achievement: 07/08/18 Potential to Achieve Goals: Good Progress towards PT goals: Progressing toward goals    Frequency    7X/week      PT Plan Current plan remains appropriate    Co-evaluation              AM-PAC PT "6 Clicks" Mobility   Outcome Measure  Help needed turning from your back to your side while in a flat bed without using bedrails?: None Help needed moving from lying on your back to sitting on the side of a flat bed without using bedrails?: None Help needed moving to and from a bed to a chair (including a wheelchair)?: None Help needed standing up from a chair using your arms (e.g., wheelchair or bedside chair)?: None Help needed to walk in hospital room?: A Little Help needed climbing 3-5 steps with a railing? : A Lot 6 Click Score: 21    End of Session Equipment Utilized During Treatment: Gait belt Activity Tolerance: Patient tolerated treatment well;No increased pain Patient left: in bed;with call bell/phone within reach;with bed alarm set;Other (comment)(B LE's placed in prevalon boots with B LE's elevated) Nurse Communication: Mobility status;Patient requests pain meds;Precautions PT Visit  Diagnosis: Muscle weakness (generalized) (M62.81);Difficulty in walking, not elsewhere classified (R26.2);Pain Pain - Right/Left: Left Pain - part of body: Ankle and joints of foot     Time: 3295-1884 PT Time Calculation (min) (ACUTE ONLY): 30 min  Charges:  $Gait Training: 8-22 mins $Therapeutic Exercise: 8-22 mins                    Leitha Bleak, PT 06/25/18, 10:10 AM [phone]

## 2018-06-26 ENCOUNTER — Encounter: Payer: Self-pay | Admitting: *Deleted

## 2018-06-26 ENCOUNTER — Encounter
Admission: EM | Disposition: A | Discharge status: Home Health | Payer: Self-pay | Source: Home / Self Care | Attending: Internal Medicine

## 2018-06-26 DIAGNOSIS — L97529 Non-pressure chronic ulcer of other part of left foot with unspecified severity: Secondary | ICD-10-CM

## 2018-06-26 DIAGNOSIS — I70262 Atherosclerosis of native arteries of extremities with gangrene, left leg: Secondary | ICD-10-CM

## 2018-06-26 HISTORY — PX: LOWER EXTREMITY ANGIOGRAPHY: CATH118251

## 2018-06-26 LAB — CBC WITH DIFFERENTIAL/PLATELET
Abs Immature Granulocytes: 0.03 10*3/uL (ref 0.00–0.07)
Basophils Absolute: 0.1 10*3/uL (ref 0.0–0.1)
Basophils Relative: 1 %
Eosinophils Absolute: 0.2 10*3/uL (ref 0.0–0.5)
Eosinophils Relative: 2 %
HCT: 28.6 % — ABNORMAL LOW (ref 36.0–46.0)
Hemoglobin: 8.4 g/dL — ABNORMAL LOW (ref 12.0–15.0)
Immature Granulocytes: 0 %
Lymphocytes Relative: 15 %
Lymphs Abs: 1.1 10*3/uL (ref 0.7–4.0)
MCH: 25.1 pg — ABNORMAL LOW (ref 26.0–34.0)
MCHC: 29.4 g/dL — ABNORMAL LOW (ref 30.0–36.0)
MCV: 85.4 fL (ref 80.0–100.0)
Monocytes Absolute: 0.5 10*3/uL (ref 0.1–1.0)
Monocytes Relative: 7 %
Neutro Abs: 5.8 10*3/uL (ref 1.7–7.7)
Neutrophils Relative %: 75 %
Platelets: 162 10*3/uL (ref 150–400)
RBC: 3.35 MIL/uL — ABNORMAL LOW (ref 3.87–5.11)
RDW: 25.2 % — ABNORMAL HIGH (ref 11.5–15.5)
Smear Review: NORMAL
WBC: 7.8 10*3/uL (ref 4.0–10.5)
nRBC: 0.3 % — ABNORMAL HIGH (ref 0.0–0.2)

## 2018-06-26 LAB — GLUCOSE, CAPILLARY
Glucose-Capillary: 100 mg/dL — ABNORMAL HIGH (ref 70–99)
Glucose-Capillary: 98 mg/dL (ref 70–99)
Glucose-Capillary: 89 mg/dL (ref 70–99)
Glucose-Capillary: 86 mg/dL (ref 70–99)
Glucose-Capillary: 182 mg/dL — ABNORMAL HIGH (ref 70–99)
Glucose-Capillary: 232 mg/dL — ABNORMAL HIGH (ref 70–99)

## 2018-06-26 LAB — CULTURE, BLOOD (ROUTINE X 2)
Culture: NO GROWTH
Culture: NO GROWTH
Special Requests: ADEQUATE

## 2018-06-26 LAB — BASIC METABOLIC PANEL
Anion gap: 10 (ref 5–15)
BUN: 27 mg/dL — ABNORMAL HIGH (ref 8–23)
CO2: 21 mmol/L — ABNORMAL LOW (ref 22–32)
Calcium: 8.5 mg/dL — ABNORMAL LOW (ref 8.9–10.3)
Chloride: 109 mmol/L (ref 98–111)
Creatinine, Ser: 1.76 mg/dL — ABNORMAL HIGH (ref 0.44–1.00)
GFR calc Af Amer: 35 mL/min — ABNORMAL LOW (ref >60)
GFR calc non Af Amer: 30 mL/min — ABNORMAL LOW (ref >60)
Glucose, Bld: 104 mg/dL — ABNORMAL HIGH (ref 70–99)
Potassium: 4.3 mmol/L (ref 3.5–5.1)
Sodium: 140 mmol/L (ref 135–145)

## 2018-06-26 SURGERY — LOWER EXTREMITY ANGIOGRAPHY
Anesthesia: Moderate Sedation | Laterality: Left

## 2018-06-26 MED ORDER — MIDAZOLAM HCL 5 MG/5ML IJ SOLN
INTRAMUSCULAR | Status: AC
  Filled 2018-06-26: qty 5

## 2018-06-26 MED ORDER — SODIUM CHLORIDE 0.9 % IV SOLN
2.0000 g | INTRAVENOUS | Status: DC
  Administered 2018-06-26: 2 g via INTRAVENOUS
  Filled 2018-06-26: qty 2

## 2018-06-26 MED ORDER — DIPHENHYDRAMINE HCL 50 MG/ML IJ SOLN
INTRAMUSCULAR | Status: AC
  Filled 2018-06-26: qty 1

## 2018-06-26 MED ORDER — SODIUM CHLORIDE 0.9% FLUSH
3.0000 mL | Freq: Two times a day (BID) | INTRAVENOUS | Status: DC
  Administered 2018-06-26 – 2018-06-27 (×2): 3 mL via INTRAVENOUS

## 2018-06-26 MED ORDER — ACETAMINOPHEN 325 MG PO TABS: 650.0000 mg | ORAL_TABLET | ORAL | Status: DC | PRN

## 2018-06-26 MED ORDER — OXYCODONE HCL 5 MG PO TABS: 5.0000 mg | ORAL_TABLET | ORAL | Status: DC | PRN

## 2018-06-26 MED ORDER — FAMOTIDINE 20 MG PO TABS
40.0000 mg | ORAL_TABLET | Freq: Once | ORAL | Status: AC
  Administered 2018-06-26: 11:00:00 40 mg via ORAL

## 2018-06-26 MED ORDER — CLOPIDOGREL BISULFATE 75 MG PO TABS
75.0000 mg | ORAL_TABLET | Freq: Every day | ORAL | Status: DC
  Administered 2018-06-27: 75 mg via ORAL
  Filled 2018-06-26: qty 1

## 2018-06-26 MED ORDER — HEPARIN SODIUM (PORCINE) 1000 UNIT/ML IJ SOLN
INTRAMUSCULAR | Status: DC | PRN
  Administered 2018-06-26: 5000 [IU] via INTRAVENOUS

## 2018-06-26 MED ORDER — FENTANYL CITRATE (PF) 100 MCG/2ML IJ SOLN
INTRAMUSCULAR | Status: AC
  Filled 2018-06-26: qty 2

## 2018-06-26 MED ORDER — HYDRALAZINE HCL 20 MG/ML IJ SOLN: 5.0000 mg | INTRAMUSCULAR | Status: DC | PRN

## 2018-06-26 MED ORDER — HEPARIN SODIUM (PORCINE) 1000 UNIT/ML IJ SOLN
INTRAMUSCULAR | Status: AC
  Filled 2018-06-26: qty 1

## 2018-06-26 MED ORDER — ONDANSETRON HCL 4 MG/2ML IJ SOLN: 4.0000 mg | Freq: Four times a day (QID) | INTRAMUSCULAR | Status: DC | PRN

## 2018-06-26 MED ORDER — FAMOTIDINE 20 MG PO TABS
ORAL_TABLET | ORAL | Status: AC
  Administered 2018-06-26: 11:00:00 40 mg via ORAL
  Filled 2018-06-26: qty 2

## 2018-06-26 MED ORDER — SODIUM CHLORIDE 0.9% FLUSH: 3.0000 mL | INTRAVENOUS | Status: DC | PRN

## 2018-06-26 MED ORDER — LINEZOLID 600 MG PO TABS
600.0000 mg | ORAL_TABLET | Freq: Two times a day (BID) | ORAL | Status: DC
  Administered 2018-06-26 – 2018-06-27 (×2): 600 mg via ORAL
  Filled 2018-06-26 (×3): qty 1

## 2018-06-26 MED ORDER — LABETALOL HCL 5 MG/ML IV SOLN: 10.0000 mg | INTRAVENOUS | Status: DC | PRN

## 2018-06-26 MED ORDER — MIDAZOLAM HCL 2 MG/2ML IJ SOLN
INTRAMUSCULAR | Status: DC | PRN
  Administered 2018-06-26: 0.5 mg via INTRAVENOUS
  Administered 2018-06-26 (×2): 1 mg via INTRAVENOUS

## 2018-06-26 MED ORDER — METHYLPREDNISOLONE SODIUM SUCC 125 MG IJ SOLR
125.0000 mg | Freq: Once | INTRAMUSCULAR | Status: AC
  Administered 2018-06-26: 11:00:00 125 mg via INTRAVENOUS

## 2018-06-26 MED ORDER — DIPHENHYDRAMINE HCL 50 MG/ML IJ SOLN
50.0000 mg | Freq: Once | INTRAMUSCULAR | Status: AC
  Administered 2018-06-26: 11:00:00 50 mg via INTRAVENOUS

## 2018-06-26 MED ORDER — FENTANYL CITRATE (PF) 100 MCG/2ML IJ SOLN
INTRAMUSCULAR | Status: DC | PRN
  Administered 2018-06-26 (×2): 25 ug via INTRAVENOUS
  Administered 2018-06-26: 50 ug via INTRAVENOUS

## 2018-06-26 MED ORDER — MORPHINE SULFATE (PF) 2 MG/ML IV SOLN: 2.0000 mg | INTRAVENOUS | Status: DC | PRN

## 2018-06-26 MED ORDER — METHYLPREDNISOLONE SODIUM SUCC 125 MG IJ SOLR
INTRAMUSCULAR | Status: AC
  Filled 2018-06-26: qty 2

## 2018-06-26 MED ORDER — SODIUM CHLORIDE 0.9 % IV SOLN: 250.0000 mL | INTRAVENOUS | Status: DC | PRN

## 2018-06-26 MED ORDER — SODIUM CHLORIDE 0.9 % IV SOLN: INTRAVENOUS | Status: AC

## 2018-06-26 MED ORDER — CLOPIDOGREL BISULFATE 75 MG PO TABS
150.0000 mg | ORAL_TABLET | ORAL | Status: AC
  Administered 2018-06-26: 150 mg via ORAL
  Filled 2018-06-26: qty 2

## 2018-06-26 SURGICAL SUPPLY — 30 items
BALLN LUTONIX 018 4X100X130 (BALLOONS) ×3
BALLN LUTONIX 018 5X60X130 (BALLOONS) ×3
BALLN LUTONIX 018 6X220X130 (BALLOONS) ×3
BALLN LUTONIX 5X220X130 (BALLOONS) ×3
BALLN ULTRASCORE 014 3X100X150 (BALLOONS) ×3
BALLN ULTRSCOR 014 2.5X150X150 (BALLOONS) ×3
BALLOON LUTONIX 018 4X100X130 (BALLOONS) ×1 IMPLANT
BALLOON LUTONIX 018 5X60X130 (BALLOONS) ×1 IMPLANT
BALLOON LUTONIX 018 6X220X130 (BALLOONS) ×1 IMPLANT
BALLOON LUTONIX 5X220X130 (BALLOONS) ×1 IMPLANT
BALLOON ULTRSC 014 2.5X150X150 (BALLOONS) ×1 IMPLANT
BALLOON ULTRSCRE 014 3X100X150 (BALLOONS) ×1 IMPLANT
CATH PIG 70CM (CATHETERS) ×3 IMPLANT
CATH SEEKER .035X135CM (CATHETERS) ×3 IMPLANT
CATH VERT 5FR 125CM (CATHETERS) ×3 IMPLANT
DEVICE PRESTO INFLATION (MISCELLANEOUS) ×3 IMPLANT
DEVICE STARCLOSE SE CLOSURE (Vascular Products) ×3 IMPLANT
GLIDEWIRE ADV .035X260CM (WIRE) ×3 IMPLANT
NEEDLE ENTRY 21GA 7CM ECHOTIP (NEEDLE) ×3 IMPLANT
PACK ANGIOGRAPHY (CUSTOM PROCEDURE TRAY) ×3 IMPLANT
SET INTRO CAPELLA COAXIAL (SET/KITS/TRAYS/PACK) ×3 IMPLANT
SHEATH BRITE TIP 5FRX11 (SHEATH) ×3 IMPLANT
SHEATH RAABE 6FR (SHEATH) ×3 IMPLANT
STENT LIFESTENT 5F 6X60X135 (Permanent Stent) ×3 IMPLANT
STENT LIFESTENT 6X170X130 (Permanent Stent) ×3 IMPLANT
SYR MEDRAD MARK 7 150ML (SYRINGE) ×3 IMPLANT
TUBING CONTRAST HIGH PRESS 72 (TUBING) ×3 IMPLANT
WIRE J 3MM .035X145CM (WIRE) ×3 IMPLANT
WIRE MAGIC TORQUE 260C (WIRE) ×3 IMPLANT
WIRE SPARTACORE .014X300CM (WIRE) ×3 IMPLANT

## 2018-06-26 NOTE — Progress Notes (Signed)
PT Cancellation Note  Patient Details Name: [name] MRN: [id number] DOB: 1955-10-21   Cancelled Treatment:    Reason Eval/Treat Not Completed: Patient not medically ready.  Per discussion with pt's nurse, pt currently on bedrest s/p procedure this morning.  Will re-attempt PT treatment session tomorrow.   Leitha Bleak, PT 06/26/18, 2:18 PM [phone]

## 2018-06-26 NOTE — Progress Notes (Signed)
[facility] [place] NAME: [name]    MR#:  [id number]  DATE OF BIRTH:  [birth year]  SUBJECTIVE:   Chief Complaint  Patient presents with  . Hyperglycemia   Patient seen at the bedside s/p Left Lower Extremity Angiogram POD # 0. She reports no pain or other issues.   REVIEW OF SYSTEMS:  Review of Systems  Constitutional: Positive for malaise/fatigue. Negative for chills, fever and weight loss.  HENT: Negative for congestion, hearing loss and sore throat.   Eyes: Negative for blurred vision and double vision.  Respiratory: Positive for shortness of breath. Negative for cough and wheezing.   Cardiovascular: Positive for leg swelling. Negative for chest pain, palpitations and orthopnea.  Gastrointestinal: Negative for abdominal pain, diarrhea, nausea and vomiting.  Genitourinary: Negative for dysuria and urgency.  Musculoskeletal: Negative for myalgias.  Skin: Negative for rash.       Diabetic foot ulcer  Neurological: Negative for dizziness, sensory change, speech change, focal weakness and headaches.  Psychiatric/Behavioral: Negative for depression.   DRUG ALLERGIES:   Allergies  Allergen Reactions  . Latex Anaphylaxis and Rash  . Gabapentin Nausea And Vomiting and Other (See Comments)   VITALS:  Blood pressure (!) 152/84, pulse 72, temperature 98.1 F (36.7 C), temperature source Oral, resp. rate 17, height 5' 5"  (1.651 m), weight 87.5 kg, SpO2 94 %. PHYSICAL EXAMINATION:   GENERAL:  63 y.o.-year-old patient lying in the bed with no acute distress.  EYES: Pupils equal, round, reactive to light and accommodation. No scleral icterus. Extraocular muscles intact.  HEENT: Head atraumatic, normocephalic. Oropharynx and nasopharynx clear.  NECK:  Supple, no jugular venous distention. No thyroid enlargement, no tenderness.  LUNGS: Normal breath sounds bilaterally, no wheezing, rales,rhonchi or crepitation. No use of accessory  muscles of respiration.  CARDIOVASCULAR: S1, S2 normal. No murmurs, rubs, or gallops.  ABDOMEN: Soft, nontender, nondistended. Bowel sounds present. No organomegaly or mass.  EXTREMITIES:Bilateral pitting edema of lower extremities.  No cyanosis, or clubbing. No rash or lesions. + pedal pulses. Ace wrap to left foot MUSCULOSKELETAL: Normal bulk, and power was 5+ grip and elbow, knee, and ankle flexion and extension bilaterally.  NEUROLOGIC:Alert and oriented x 3. CN 2-12 intact. Sensation to light touch and cold stimuli intact bilaterally. Finger to nose nl. Babinski is downgoing. DTR's (biceps, patellar, and achilles) 2+ and symmetric throughout. Gait not tested due to safety concern. PSYCHIATRIC: The patient is alert and oriented x 3.  SKIN: Wound as below s/p post Amputation left great toe MTPJ         DATA REVIEWED:  LABORATORY PANEL:  Female CBC Recent Labs  Lab 06/26/18 0651  WBC 7.8  HGB 8.4*  HCT 28.6*  PLT 162   ------------------------------------------------------------------------------------------------------------------ Chemistries  Recent Labs  Lab 06/20/18 2038  06/26/18 0651  NA 135   < > 140  K 4.0   < > 4.3  CL 110   < > 109  CO2 17*   < > 21*  GLUCOSE 166*   < > 104*  BUN 26*   < > 27*  CREATININE 1.85*   < > 1.76*  CALCIUM 8.3*   < > 8.5*  AST 12*  --   --   ALT 8  --   --   ALKPHOS 94  --   --   BILITOT 0.4  --   --    < > = values in this interval not displayed.  RADIOLOGY:  No results found. ASSESSMENT AND PLAN:   64 y.o. female with history of CHF, COPD, GI bleed, NSTEMI, diabetes mellitus and cervical cancer presenting with increased weakness and fatigue x1 month.  1.  Left great toe osteomyelitis - patient with history of diabetes mellitus, has had recent debridement 4/20 - X-ray shows osteomyelitis involving the base of the first distal phalanx and distal aspect of the first proximal phalanx - s/p Amputation left great toe MTPJ POD  # 1 - Continue IV antibiotics with Ceftriaxone and Flagyl - s/p Left Lower Extremity Angiogram with  Recanalization for limb salvage with Dr. Delana Meyer today. - ID input appreciated  2. Peptic Ulcer disease - status post EGD 06/22/2018 showing gastric ulcers (class Ib) treated with bipolar cautery - Post PRBC transfusion 06/22/2018. - Hgb stable - IVFs - Pantoprazole 40 mg p.o. twice daily - Hold NSAIDs, steroids, ASA - GI Consult appreciated. Will follow with them in 2 weeks post discharge  3.  Iron deficiency anemia -secondary to chronic blood loss - stable - Status post IV iron infusion 06/23/18  4. DM: sugars have been well-controlled on current regimen  - hemoglobin A1c 1 6/5 was 5.9 - sliding scale insulin coverage in addition to Lantus - ADA 2100 calorie diet  - FS BS qac and qhs   5. 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  Patient has had bioprosthesis for aortic valve - Beta-Blockade: Carvedilol  - Furosemide 40 mg daily PO - Low salt diet   6. Chronic COPD  - no evidence of exacerbation.  - Supplemental O2, goal sat 88-92% - Duonebs q6hours  7. CKD -BUN/Cr decreased at baseline - Will need outpatient follow up with Nephrology to establish care due to decreasing renal function  7. DVT prophylaxis - Hold anti-coagulation due to GI bleed    All the records are reviewed and case discussed with Care Management/Social Worker. Management plans discussed with the patient, family and they are in agreement.  CODE STATUS: Full Code  TOTAL TIME TAKING CARE OF THIS PATIENT: 38 minutes.   More than 50% of the time was spent in counseling/coordination of care: YES  POSSIBLE D/C IN 1 DAYS, DEPENDING ON CLINICAL CONDITION.   on 06/26/2018 at 12:54 PM  This patient was staffed with Dr. Valetta Fuller, [facility] who personally evaluated patient, reviewed documentation and agreed with assessment  and plan of care as above.  Rufina Falco, DNP, FNP-BC [facility] Nurse Practitioner   Between 7am to 6pm - Pager [phone]  After 6pm go to [email or web address] - Proofreader  [facility] [place] [facility]  Office  [phone]  CC: Primary care physician; Patient, No Pcp Per  Note: This dictation was prepared with Dragon dictation along with smaller phrase technology. Any transcriptional errors that result from this process are unintentional.

## 2018-06-26 NOTE — Plan of Care (Signed)
Pt alert and oriented x 4. Educated about being NPO after midnight this shift. Voiced  understanding. Pain in left foot managed with prn percocet x 1. Dressing to left foot CDI. VSS. Will continue to monitor.   Problem: Education: Goal: Knowledge of General Education information will improve Description Including pain rating scale, medication(s)/side effects and non-pharmacologic comfort measures Outcome: Progressing   Problem: Clinical Measurements: Goal: Will remain free from infection Outcome: Progressing Goal: Diagnostic test results will improve Outcome: Progressing   Problem: Activity: Goal: Risk for activity intolerance will decrease Outcome: Progressing   Problem: Coping: Goal: Level of anxiety will decrease Outcome: Progressing   Problem: Elimination: Goal: Will not experience complications related to urinary retention Outcome: Progressing   Problem: Pain Managment: Goal: General experience of comfort will improve Outcome: Progressing   Problem: Skin Integrity: Goal: Risk for impaired skin integrity will decrease Outcome: Progressing

## 2018-06-26 NOTE — Progress Notes (Signed)
Messaged Hezzie Bump, [place] regarding patient complaining of itching all over post angiogram. Treatment for contrast allergy was ordered. Benadryl 50 mg IV, Pepcid 40 mg oral, and 125 mg methylprednisolone was administered. Per Dr. Delana Meyer this event should not warrant contrast allergy be reflected in the chart.

## 2018-06-26 NOTE — Op Note (Signed)
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 Introduction catheter into left lower extremity 3rd order catheter placement  2. Contrast injection left lower extremity for distal runoff  3. Percutaneous transluminal angioplasty and stent placement left superficial femoral and popliteal arteries 4. Percutaneous transluminal angioplasty left posterior tibial to 3 mm  5. Percutaneous transluminal angioplasty left peroneal to 2.5 mm              6.  Star close closure right common femoral arteriotomy  Anesthesia: Conscious sedation was administered under my direct supervision by the interventional radiology RN. IV Versed plus fentanyl were utilized. Continuous ECG, pulse oximetry and blood pressure was monitored throughout the entire procedure.  Conscious sedation was for a total of 1 hour 17 minutes.  Sheath: 6 French Raby right common femoral retrograde  Contrast: 90 cc  Fluoroscopy Time: 15.6 minutes  Indications: [name] presents with increasing pain of the left lower extremity.  She presented to the hospital with gangrenous changes of the left great toe.  Pulses are nonpalpable.  This suggests the patient is having limb threatening ischemia and is at high risk for amputation of the foot. The risks and benefits are reviewed for angiography with hope for intervention for limb salvage all questions answered patient agrees to proceed.  Procedure:[name] is a 63 y.o. y.o. female who was identified and appropriate procedural time out was performed. The patient was then placed supine on the table and prepped and  draped in the usual sterile fashion.   Ultrasound was placed in the sterile sleeve and the right groin was evaluated the right common femoral artery was echolucent and pulsatile indicating patency. Image was recorded for the permanent record and under real-time visualization a microneedle was inserted into the common femoral artery followed by the microwire and then the micro-sheath. A J-wire was then advanced through the micro-sheath and a 5 [place] sheath was then inserted over a J-wire. J-wire was then advanced and a 5 French pigtail catheter was positioned at the level of T12.  AP projection of the aorta was then obtained. Pigtail catheter was repositioned to above the bifurcation and a RAO view of the pelvis was obtained. Subsequently a pigtail catheter with the advantage wire was used to cross the aortic bifurcation the catheter wire were advanced down into the left distal external iliac artery. Oblique view of the femoral bifurcation was then obtained and subsequently the wire was reintroduced and the pigtail catheter negotiated into the SFA representing third order catheter placement. Distal runoff was then performed.  Diagnostic interpretation: The abdominal aorta is opacified with a bolus injection of contrast.  Bilateral nephrograms are noted kidney size appears normal.  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  Left renal artery appears patent without hemodynamically significant stenosis.  The infrarenal aorta is smooth and free of hemodynamically significant stenosis.  The right common iliac is somewhat enlarged.  There does not appear to be any flow-limiting stenosis in the right common external or internal iliac arteries.  On the left there is mild disease at the origin of the common iliac distally it remains widely patent as is the internal and the external iliac arteries on the left.  The common femoral  and profunda femoris are  patent no evidence of hemodynamically significant stenosis.  There is mild stenosis less than 30% at the origin of the SFA.  The proximal and mid portions of the SFA are patent.  At [facility] canal the patient develops diffuse increasing calcific plaque with multiple lesions of greater than 70% throughout Hunter's canal extending into the popliteal.  The at knee popliteal demonstrates a greater than 80% stenosis.  The trifurcation is heavily diseased with occlusion of the anterior tibial shortly after its origin anterior tibial remains occluded throughout its entire course.  The tibioperoneal trunk demonstrates diffuse greater than 90% stenosis.  There are multiple greater than 90% lesions in the proximal one third of the peroneal.  Posterior tibial is the dominant runoff to the foot however there appears to be a greater than 80% stenosis at its origin and then several centimeters distal to the origin there is a focal greater than 80% stenosis as well.  Distal to this second lesion the posterior tibial is patent and free of hemodynamically significant stenosis filling the plantar vessels as well as the pedal arch.  There is reconstitution of the dorsalis pedis via peroneal collaterals.  5000 units of heparin was then given and allowed to circulate and a 6 [place] Raby sheath was advanced up and over the bifurcation and positioned in the femoral artery  KMP catheter and advantage wire were then negotiated down into the distal popliteal and then into the peroneal.  Hand injection contrast demonstrated the SFA and popliteal anatomy in detail.  4 mm Lutonix balloon was used to angioplasty the SFA and popliteal. Each inflation was for 2 minutes at 12 atm.  The follow-up imaging demonstrated multiple areas of greater than 50% residual stenosis.  In the mid popliteal a 6 mm x 60 mm life stent was then deployed and this was postdilated with a 5 mm x 60 mm Lutonix drug-eluting balloon inflated to 10 atm for 30 seconds.   A second 6 mm x 170 mm life stent was deployed extending from the leading edge of the initial stent into Hunter's canal and then this was postdilated with a 6 mm x 200 mm Lutonix drug-eluting balloon inflated to 10 atm for 30 seconds.  Follow-up imaging demonstrated wide patency of the femoral-popliteal with less than 5% residual stenosis.  The detector was then repositioned and the peroneal and tibioperoneal trunk was reimaged multiple greater than 90% stenosis are noted in this area and after appropriate measurements are made a 2.5 x 15 ultra score balloon was used to angioplasty the peroneal and tibioperoneal trunk arteries. Inflation were to 10 atmospheres for 1 minute. Follow-up imaging demonstrated patency with excellent result and less than 5% residual stenosis within the peroneal.  The catheter was reintroduced and the wire negotiated into the posterior tibial where a 3 mm x 15 cm ultra score balloon was used to angioplasty the posterior tibial extending into the tibioperoneal trunk.  Angioplasty was to 10 atm for 1 minute.  I then introduced a 4 mm x 10 cm Lutonix drug-eluting balloon and angioplastied the tibioperoneal trunk inflation was to 6 atm for 1 minute.  Distal runoff was then reassessed and noted to be widely patent.   After review of these images the sheath is pulled into the right external iliac oblique of the common femoral is obtained and a Star close device deployed. There no immediate Complications.  Findings:  The abdominal aorta is opacified with a bolus injection of contrast.  Bilateral  nephrograms are noted kidney size appears normal.  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  Left renal artery appears patent without hemodynamically significant stenosis.  The infrarenal aorta is smooth and free of hemodynamically significant stenosis.  The right common iliac is somewhat enlarged.  There does not appear to be any  flow-limiting stenosis in the right common external or internal iliac arteries.  On the left there is mild disease at the origin of the common iliac distally it remains widely patent as is the internal and the external iliac arteries on the left.  The common femoral and profunda femoris are patent no evidence of hemodynamically significant stenosis.  There is mild stenosis less than 30% at the origin of the SFA.  The proximal and mid portions of the SFA are patent.  At [facility] canal the patient develops diffuse increasing calcific plaque with multiple lesions of greater than 70% throughout Hunter's canal extending into the popliteal.  The at knee popliteal demonstrates a greater than 80% stenosis.  The trifurcation is heavily diseased with occlusion of the anterior tibial shortly after its origin anterior tibial remains occluded throughout its entire course.  The tibioperoneal trunk demonstrates diffuse greater than 90% stenosis.  There are multiple greater than 90% lesions in the proximal one third of the peroneal.  Posterior tibial is the dominant runoff to the foot however there appears to be a greater than 80% stenosis at its origin and then several centimeters distal to the origin there is a focal greater than 80% stenosis as well.  Distal to this second lesion the posterior tibial is patent and free of hemodynamically significant stenosis filling the plantar vessels as well as the pedal arch.  There is reconstitution of the dorsalis pedis via peroneal collaterals.  Following angioplasty and stent placement in the SFA and popliteal there is less than 5% residual stenosis.  Following angioplasty to 2.5 mm of the peroneal there is less than 5% residual stenosis.  Following angioplasty to 3 mm of the posterior tibial there is less than 5% residual stenosis following treatment of the tibioperoneal trunk to a maximum of 4 mm with Lutonix drug-eluting balloon there is less than 5% residual stenosis.  Distal  runoff is preserved.  There is now inline flow to the foot.   Summary: Successful recanalization left lower extremity for limb salvage   Disposition: Patient was taken to the recovery room in stable condition having tolerated the procedure well.  Belenda Cruise  06/26/2018,11:00 AM

## 2018-06-26 NOTE — Progress Notes (Signed)
ID S/p left great toe amputation Had angio today  Percutaneous transluminal angioplasty and stent placement left superficial femoral and popliteal arteries. Percutaneous transluminal angioplasty left posterior tibial to 3 mm. Percutaneous transluminal angioplasty left peroneal to 2.5 mm   Culture MRSA- will change current regimen of ceftriaxone+ flagyl to linezolid

## 2018-06-26 NOTE — Progress Notes (Signed)
PT Cancellation Note  Patient Details Name: [name] MRN: [id number] DOB: 1955/10/08   Cancelled Treatment:    Reason Eval/Treat Not Completed: Patient at procedure or test/unavailable.  Pt currently off floor for procedure.  Will re-attempt PT treatment at a later date/time as medically appropriate.   Leitha Bleak, PT 06/26/18, 9:37 AM [phone]

## 2018-06-26 NOTE — Progress Notes (Addendum)
Daily Progress Note   Subjective  - 2 Days Post-Op  F/u great toe amputation left.  Doing well  Objective Vitals:   06/25/18 1650 06/25/18 1941 06/26/18 0320 06/26/18 0725  BP: 120/66 120/70 (!) 141/90 139/72  Pulse: 66 72 80 74  Resp: 17 20 20 19   Temp: 98.2 F (36.8 C) 97.7 F (36.5 C) 98.2 F (36.8 C) 98.2 F (36.8 C)  TempSrc: Oral Oral Oral Oral  SpO2: 100% 100% 98% 95%  Weight:   87.5 kg   Height:        Physical Exam: Wound well coapted.  No dehiscence.  No purulence.  Minimal erythema along incision.        Laboratory CBC    Component Value Date/Time   WBC 9.0 06/25/2018 0540   HGB 8.5 (L) 06/25/2018 0540   HGB 7.3 (L) 11/23/2017 1454   HCT 28.8 (L) 06/25/2018 0540   HCT 45.7 08/03/2013 2138   PLT 177 06/25/2018 0540   PLT 118 (L) 08/03/2013 2138    BMET    Component Value Date/Time   NA 140 06/26/2018 0651   NA 139 08/03/2013 2138   K 4.3 06/26/2018 0651   K 3.4 (L) 08/03/2013 2138   CL 109 06/26/2018 0651   CL 104 08/03/2013 2138   CO2 21 (L) 06/26/2018 0651   CO2 27 08/03/2013 2138   GLUCOSE 104 (H) 06/26/2018 0651   GLUCOSE 153 (H) 08/03/2013 2138   BUN 27 (H) 06/26/2018 0651   BUN 16 08/03/2013 2138   CREATININE 1.76 (H) 06/26/2018 0651   CREATININE 1.19 08/03/2013 2138   CALCIUM 8.5 (L) 06/26/2018 0651   CALCIUM 9.2 08/03/2013 2138   GFRNONAA 30 (L) 06/26/2018 0651   GFRNONAA 50 (L) 08/03/2013 2138   GFRAA 35 (L) 06/26/2018 0651   GFRAA 58 (L) 08/03/2013 2138   Operative culture still pending  Assessment/Planning: Osteomyelitis left great toe s/p amputation   Suspect amputation was curative.  Wound looks good.  Suspect can be d/c'd on po abx and f/u in outpt clinic.  No dressing changes needed upon d/c  Keep bandage clean, dry and intact.  F/U with me in 1 week.  WB to heel for transfer.    Samara Deist A  06/26/2018, 8:10 AM

## 2018-06-26 NOTE — Progress Notes (Signed)
Patient back from specials.  Star closed at 1045, patient ok to sit up at this time.

## 2018-06-27 DIAGNOSIS — I739 Peripheral vascular disease, unspecified: Secondary | ICD-10-CM

## 2018-06-27 DIAGNOSIS — Z95820 Peripheral vascular angioplasty status with implants and grafts: Secondary | ICD-10-CM

## 2018-06-27 LAB — BASIC METABOLIC PANEL
Anion gap: 9 (ref 5–15)
BUN: 31 mg/dL — ABNORMAL HIGH (ref 8–23)
CO2: 21 mmol/L — ABNORMAL LOW (ref 22–32)
Calcium: 8.3 mg/dL — ABNORMAL LOW (ref 8.9–10.3)
Chloride: 107 mmol/L (ref 98–111)
Creatinine, Ser: 1.9 mg/dL — ABNORMAL HIGH (ref 0.44–1.00)
GFR calc Af Amer: 32 mL/min — ABNORMAL LOW (ref >60)
GFR calc non Af Amer: 28 mL/min — ABNORMAL LOW (ref >60)
Glucose, Bld: 214 mg/dL — ABNORMAL HIGH (ref 70–99)
Potassium: 4.4 mmol/L (ref 3.5–5.1)
Sodium: 137 mmol/L (ref 135–145)

## 2018-06-27 LAB — CBC
HCT: 27.7 % — ABNORMAL LOW (ref 36.0–46.0)
Hemoglobin: 8.1 g/dL — ABNORMAL LOW (ref 12.0–15.0)
MCH: 25.4 pg — ABNORMAL LOW (ref 26.0–34.0)
MCHC: 29.2 g/dL — ABNORMAL LOW (ref 30.0–36.0)
MCV: 86.8 fL (ref 80.0–100.0)
Platelets: 145 10*3/uL — ABNORMAL LOW (ref 150–400)
RBC: 3.19 MIL/uL — ABNORMAL LOW (ref 3.87–5.11)
RDW: 25.6 % — ABNORMAL HIGH (ref 11.5–15.5)
WBC: 8.2 10*3/uL (ref 4.0–10.5)
nRBC: 0 % (ref 0.0–0.2)

## 2018-06-27 LAB — GLUCOSE, CAPILLARY
Glucose-Capillary: 182 mg/dL — ABNORMAL HIGH (ref 70–99)
Glucose-Capillary: 178 mg/dL — ABNORMAL HIGH (ref 70–99)

## 2018-06-27 LAB — SURGICAL PATHOLOGY

## 2018-06-27 LAB — H PYLORI, IGM, IGG, IGA AB
H Pylori IgG: 0.78 Index Value (ref 0.00–0.79)
H. Pylogi, Iga Abs: 25.4 units — ABNORMAL HIGH (ref 0.0–8.9)
H. Pylogi, Igm Abs: <9 units (ref 0.0–8.9)

## 2018-06-27 MED ORDER — THIAMINE HCL 100 MG PO TABS: 100.0000 mg | ORAL_TABLET | Freq: Every day | ORAL | 0 refills | Status: DC

## 2018-06-27 MED ORDER — CLOPIDOGREL BISULFATE 75 MG PO TABS: 75.0000 mg | ORAL_TABLET | Freq: Every day | ORAL | 0 refills | Status: DC

## 2018-06-27 MED ORDER — OXYCODONE HCL 5 MG PO TABS: 5.0000 mg | ORAL_TABLET | ORAL | 0 refills | Status: DC | PRN

## 2018-06-27 MED ORDER — LINEZOLID 600 MG PO TABS: 600.0000 mg | ORAL_TABLET | Freq: Two times a day (BID) | ORAL | 0 refills | Status: DC

## 2018-06-27 MED ORDER — LINEZOLID 600 MG PO TABS: 600.0000 mg | ORAL_TABLET | Freq: Two times a day (BID) | ORAL | 0 refills | Status: AC

## 2018-06-27 MED ORDER — ADULT MULTIVITAMIN W/MINERALS CH: 1.0000 | ORAL_TABLET | Freq: Every day | ORAL | 0 refills | Status: AC

## 2018-06-27 MED ORDER — OMEPRAZOLE 40 MG PO CPDR: 40.0000 mg | DELAYED_RELEASE_CAPSULE | Freq: Two times a day (BID) | ORAL | 0 refills | Status: DC

## 2018-06-27 NOTE — TOC Transition Note (Signed)
Transition of Care [facility]) - CM/SW Discharge Note   Patient Details  Name: [name] MRN: [id number] Date of Birth: 03-20-1955  Transition of Care [facility]) CM/SW Contact:  Ross Ludwig, LCSW Phone Number: 06/27/2018, 3:44 PM   Clinical Narrative:     PT worked with patient and are recommending home health PT.  CSW contacted [facility] and they will accept patient.  Home Health PT and nursing to be provided, patient also requesting rolling walker and 3 in 1 DME.  CSW contacted Brad at [employer] and he will provide equipment.   Final next level of care: [place] Barriers to Discharge: No Barriers Identified   Patient Goals and CMS Choice Patient states their goals for this hospitalization and ongoing recovery are:: Plan to return back home with Home Health PT and nursing CMS Medicare.gov Compare Post Acute Care list provided to:: Patient Choice offered to / list presented to : Patient  Discharge Placement    Patient discharging back home with home health.                     Discharge Plan and Services   Discharge Planning Services: CM Consult            DME Arranged: Gilford Rile rolling, 3-N-1 DME Agency: [employer] Date DME Agency Contacted: 06/27/18 Time DME Agency Contacted: 1500 Representative spoke with at DME Agency: [place]: RN, PT [place]: [place] ([place]) Date [place]: 06/27/18 Time [place]: 1500 Representative spoke with at [place]: [place] (SDOH) Interventions     Readmission Risk Interventions Readmission Risk Prevention Plan 09/30/2017  Transportation Screening Complete  PCP or Specialist Appt within 5-7 Days Complete  Home Care Screening Complete  Medication Review (RN CM) Complete  Some recent data might be hidden

## 2018-06-27 NOTE — Discharge Instructions (Signed)
Vascular Surgery Discharge Instructions 1) Remove right groin dressing tomorrow 2) Keep groins clean and dry

## 2018-06-27 NOTE — Progress Notes (Signed)
Physical Therapy Treatment Patient Details Name: [name] MRN: [id number] DOB: November 16, 1955 Today's Date: 06/27/2018    History of Present Illness 63 y.o. female with a known history of CHF, COPD, GI bleed September 2019, N STEMI, diabetes mellitus, history of cervical cancer. Pt came to ED not feeling well and having issues with blood glucose as well as L great toe pain.  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    PT Comments    Pt modified independent with bed mobility, transfers, and ambulation with RW--see below for details (limited ambulation to 30 feet in room per activity order for bathroom privileges; pt reports she does not have to walk further than 30 feet at a time at home and has w/c-if needed- to enter home via ramp).  Generalized weakness noted.  Pt steady without any loss of balance using RW; pt denied any pain during session.  Plan to discharge home today.  Discussed PT recommendations with pt's nurse.    Follow Up Recommendations  Home health PT;Supervision for mobility/OOB     Equipment Recommendations  Rolling walker with 5" wheels;3in1 (PT)    Recommendations for Other Services       Precautions / Restrictions Precautions Precautions: Fall Restrictions Weight Bearing Restrictions: Yes LLE Weight Bearing: Weight bearing as tolerated Other Position/Activity Restrictions: WB as tolerated to heel (s/p L great toe amp); L post op shoe    Mobility  Bed Mobility Overal bed mobility: Modified Independent Bed Mobility: Supine to Sit;Sit to Supine     Supine to sit: Modified independent (Device/Increase time);HOB elevated Sit to supine: Modified independent (Device/Increase time);HOB elevated   General bed mobility comments: no assist required  Transfers Overall transfer level: Modified independent Equipment used: Rolling walker (2 wheeled) Transfers: Sit to/from [facility] Sit to Stand: Modified independent  (Device/Increase time) Stand pivot transfers: Modified independent (Device/Increase time)       General transfer comment: steady safe transfers noted with RW  Ambulation/Gait Ambulation/Gait assistance: Modified independent (Device/Increase time) Gait Distance (Feet): 30 Feet Assistive device: Rolling walker (2 wheeled)   Gait velocity: decreased   General Gait Details: initial vc's for technique (Heel WB'ing) and then pt able to perform safely on own; step to gait pattern   Stairs Stairs: (deferred (pt has ramp to enter home))           Wheelchair Mobility    Modified Rankin (Stroke Patients Only)       Balance Overall balance assessment: Needs assistance Sitting-balance support: No upper extremity supported;Feet supported Sitting balance-Leahy Scale: Normal Sitting balance - Comments: steady sitting reaching outside BOS   Standing balance support: No upper extremity supported Standing balance-Leahy Scale: Good Standing balance comment: steady standing reaching within BOS                            Cognition Arousal/Alertness: Awake/alert Behavior During Therapy: WFL for tasks assessed/performed Overall Cognitive Status: Within Functional Limits for tasks assessed                                        Exercises      General Comments   Therapy received message to assess pt prior to discharge today.  Pt agreeable to PT session.      Pertinent Vitals/Pain Pain Assessment: No/denies pain Pain Score: 0-No pain Pain Intervention(s):  Limited activity within patient's tolerance;Monitored during session;Repositioned  Vitals (HR and O2 on room air) stable and WFL throughout treatment session.    Home Living                      Prior Function            PT Goals (current goals can now be found in the care plan section) Acute Rehab PT Goals Patient Stated Goal: go home PT Goal Formulation: With patient Time For Goal  Achievement: 07/08/18 Potential to Achieve Goals: Good Progress towards PT goals: Progressing toward goals    Frequency    7X/week      PT Plan Current plan remains appropriate    Co-evaluation              AM-PAC PT "6 Clicks" Mobility   Outcome Measure  Help needed turning from your back to your side while in a flat bed without using bedrails?: None Help needed moving from lying on your back to sitting on the side of a flat bed without using bedrails?: None Help needed moving to and from a bed to a chair (including a wheelchair)?: None Help needed standing up from a chair using your arms (e.g., wheelchair or bedside chair)?: None Help needed to walk in hospital room?: None Help needed climbing 3-5 steps with a railing? : A Lot 6 Click Score: 22    End of Session Equipment Utilized During Treatment: Gait belt Activity Tolerance: Patient tolerated treatment well Patient left: with call bell/phone within reach;with bed alarm set(sitting edge of bed (cleared by pt's nurse)) Nurse Communication: Mobility status;Precautions;Weight bearing status PT Visit Diagnosis: Muscle weakness (generalized) (M62.81);Difficulty in walking, not elsewhere classified (R26.2);Pain Pain - Right/Left: Left Pain - part of body: Ankle and joints of foot     Time: 3546-5681 PT Time Calculation (min) (ACUTE ONLY): 12 min  Charges:  $Gait Training: 8-22 mins                    Leitha Bleak, PT 06/27/18, 3:27 PM [phone]

## 2018-06-27 NOTE — Progress Notes (Signed)
[facility] Daily Progress Note   Subjective: 1 Day Post-Op: 1. Introduction catheter into left lower extremity 3rd order catheter placement  2. Contrast injection left lower extremity for distal runoff  3. Percutaneous transluminal angioplasty and stent placement left superficial femoral and popliteal arteries 4. Percutaneous transluminal angioplasty left posterior tibial to 3 mm  5. Percutaneous transluminal angioplasty left peroneal to 2.5 mm             6.  Star close closure right common femoral arteriotomy  Patient without complaint this AM. No issues overnight.   Objective: Vitals:   06/26/18 1652 06/26/18 1947 06/27/18 0345 06/27/18 0740  BP: (!) 159/85 (!) 150/73 140/70 137/68  Pulse: 82 81 79 74  Resp: 19 20 20 19   Temp: 98.1 F (36.7 C) 98.2 F (36.8 C) 98.4 F (36.9 C) 98 F (36.7 C)  TempSrc: Oral Oral Oral Oral  SpO2: 98% 95% 98% 100%  Weight:      Height:        Intake/Output Summary (Last 24 hours) at 06/27/2018 1119 Last data filed at 06/27/2018 1009 Gross per 24 hour  Intake 1043.96 ml  Output 1550 ml  Net -506.04 ml   Physical Exam: A&Ox2, NAD CV: RRR Pulmonary: CTA Bilaterally Abdomen: Soft, Nontender, Nondistended Right Groin: Procedure dressing intact, clean and dry Vascular:  Left Lower Extremity: Thigh soft, calf soft. Podiatry dressing intact, clean and dry. Extremity warm distally to toes.     Laboratory: CBC    Component Value Date/Time   WBC 8.2 06/27/2018 0359   HGB 8.1 (L) 06/27/2018 0359   HGB 7.3 (L) 11/23/2017 1454   HCT 27.7 (L) 06/27/2018 0359   HCT 45.7 08/03/2013 2138   PLT 145 (L) 06/27/2018 0359   PLT 118 (L) 08/03/2013 2138   BMET    Component Value Date/Time   NA 137 06/27/2018 0359   NA 139 08/03/2013 2138   K 4.4 06/27/2018 0359   K 3.4 (L) 08/03/2013 2138   CL 107 06/27/2018 0359   CL 104 08/03/2013 2138   CO2 21 (L)  06/27/2018 0359   CO2 27 08/03/2013 2138   GLUCOSE 214 (H) 06/27/2018 0359   GLUCOSE 153 (H) 08/03/2013 2138   BUN 31 (H) 06/27/2018 0359   BUN 16 08/03/2013 2138   CREATININE 1.90 (H) 06/27/2018 0359   CREATININE 1.19 08/03/2013 2138   CALCIUM 8.3 (L) 06/27/2018 0359   CALCIUM 9.2 08/03/2013 2138   GFRNONAA 28 (L) 06/27/2018 0359   GFRNONAA 50 (L) 08/03/2013 2138   GFRAA 32 (L) 06/27/2018 0359   GFRAA 58 (L) 08/03/2013 2138   Assessment/Planning: The patient is a 63 year old female with a past medical history of coronary artery disease status post NSTEMI, diabetes, COPD with active tobacco abuse, congestive heart failure, history of cervical cancer, iron deficiency / B12 / folate anemia, history of gastric and colonic AVM s/p left lower extremity angiogram with intervention - POD #1 1) Successful revascularization of the left lower extremity 2) Patient to follow up in outpatient setting for continued monitoring of PAD 3) Vascular to sign off, please re-consult if needed  Discussed with Dr. Eber Hong Joliana Claflin PA-C 06/27/2018 11:19 AM

## 2018-06-27 NOTE — Progress Notes (Signed)
Patient picked up via lyft.  Telemetry and piv d/c.  New rx and f/u appts given.  No questions at this time.

## 2018-06-27 NOTE — Discharge Summary (Addendum)
[facility] NAME: [name]    MR#:  [id number]  DATE OF BIRTH:  Jan 13, 1956  DATE OF ADMISSION:  06/20/2018   ADMITTING PHYSICIAN: Hillary Bow, MD  DATE OF DISCHARGE: 06/27/18  PRIMARY CARE PHYSICIAN: Patient, No Pcp Per   ADMISSION DIAGNOSIS:   Anemia, unspecified type [D64.9] Severe anemia [D64.9]  DISCHARGE DIAGNOSIS:   Active Problems:   Severe anemia  SECONDARY DIAGNOSIS:   Past Medical History:  Diagnosis Date  . Cervical cancer ([place])   . CHF (congestive heart failure) ([place])   . COPD (chronic obstructive pulmonary disease) ([place])   . Diabetes mellitus without complication ([place])   . NSTEMI (non-ST elevated myocardial infarction) [facility])     HOSPITAL COURSE:  63 y.o. female with history of CHF, COPD, GI bleed, NSTEMI, diabetes mellitus and cervical cancer presenting with increased weakness and fatigue x1 month.  1.  Left great toe osteomyelitis - patient with history of diabetes mellitus, has had recent debridement 4/20 - X-ray shows osteomyelitis involving the base of the first distal phalanx and distal aspect of the first proximal phalanx - s/p Amputation left great toe MTPJ POD # 2 - s/p Left Lower Extremity Angiogram with  Recanalization for limb salvage with Dr. Delana Meyer POD # 1 - Treated with Ceftriaxone and Flagyl which was switched to Linezolid per ID recommendation - Follow up with Dr. Vickki Muff next week - Follow up with ID in 1 week  2. Peptic Ulcer disease - status post EGD 06/22/2018 showing gastric ulcers (class Ib) treated with bipolar cautery - Post PRBC transfusion 06/22/2018. - Hgb stable - IVFs - Start Prilosec 40 mg p.o. twice daily - Patient advised to avoid NSAIDs, steroids, ASA - Will follow with GI in 2 weeks post discharge  3.  Iron deficiency anemia -secondary to chronic blood loss - stable - Status post IV iron infusion 06/23/18  4. DM: sugars have been well-controlled on  current regimen  - hemoglobin A1c 1 6/5 was 5.9 - ADA 2100 calorie diet  - FS BS qac and qhs   5. Chronic congestive heart failure with reduced EF - stable Echocardiogram with moderate LVH and EF at 40 to45% and diffuse hypokinesis noted. Patient has had bioprosthesis for aortic valve - Beta-Blockade: Carvedilol  - Furosemide 40 mg daily PO - Low salt diet   6. ChronicCOPD - no evidence of exacerbation.  - Continue home inhalers  7. CKD -BUN/Cr decreased at baseline - Will need outpatient follow up with Nephrology to establish care due to decreasing renal function - Patient does not have a PCP encouraged her to establish care with PCP of her choice   DISCHARGE CONDITIONS:   STABLE CONSULTS OBTAINED:   Treatment Team:  Tsosie Billing, MD  DRUG ALLERGIES:   Allergies  Allergen Reactions  . Latex Anaphylaxis and Rash  . Gabapentin Nausea And Vomiting and Other (See Comments)   DISCHARGE MEDICATIONS:   Allergies as of 06/27/2018      Reactions   Latex Anaphylaxis, Rash   Gabapentin Nausea And Vomiting, Other (See Comments)      Medication List    TAKE these medications   atorvastatin 40 MG tablet Commonly known as:  Lipitor Take 1 tablet (40 mg total) by mouth daily.   carvedilol 3.125 MG tablet Commonly known as:  COREG Take 1 tablet (3.125 mg total) by mouth 2 (two) times daily with a meal.   clopidogrel 75 MG tablet Commonly known  as:  PLAVIX Take 1 tablet (75 mg total) by mouth daily with breakfast. Start taking on:  June 28, 2018   ferrous sulfate 325 (65 FE) MG tablet Take 1 tablet (325 mg total) by mouth 2 (two) times daily with a meal.   folic acid 1 MG tablet Commonly known as:  FOLVITE Take 1 tablet (1 mg total) by mouth daily.   furosemide 40 MG tablet Commonly known as:  Lasix Take 1 tablet (40 mg total) by mouth daily.   Insulin Glargine 100 UNIT/ML Solostar Pen Commonly known as:  Lantus SoloStar Inject 15 Units into the  skin daily. What changed:  how much to take   linezolid 600 MG tablet Commonly known as:  ZYVOX Take 1 tablet (600 mg total) by mouth every 12 (twelve) hours for 7 days.   multivitamin with minerals Tabs tablet Take 1 tablet by mouth daily. Start taking on:  June 28, 2018   omeprazole 40 MG capsule Commonly known as:  PriLOSEC Take 1 capsule (40 mg total) by mouth 2 (two) times a day. What changed:  when to take this   ondansetron 4 MG tablet Commonly known as:  ZOFRAN Take 1 tablet (4 mg total) by mouth every 6 (six) hours as needed for nausea.   oxyCODONE 5 MG immediate release tablet Commonly known as:  Oxy IR/ROXICODONE Take 1-2 tablets (5-10 mg total) by mouth every 4 (four) hours as needed for moderate pain.   pregabalin 100 MG capsule Commonly known as:  LYRICA Take 1-2 capsules (100-200 mg total) by mouth See admin instructions. Take 1 capsule (100MG) by mouth every morning and 2 capsules (200MG) by mouth every night   ProAir HFA 108 (90 Base) MCG/ACT inhaler Generic drug:  albuterol Inhale 1-2 puffs into the lungs every 4 (four) hours as needed.   Symbicort 160-4.5 MCG/ACT inhaler Generic drug:  budesonide-formoterol Inhale 2 puffs into the lungs 2 (two) times daily.   thiamine 100 MG tablet Take 1 tablet (100 mg total) by mouth daily. Start taking on:  June 28, 2018       DISCHARGE INSTRUCTIONS:    DIET:   Diabetic diet and Renal diet  ACTIVITY:   Activity as tolerated  OXYGEN:   Home Oxygen: No.  Oxygen Delivery: room air  DISCHARGE LOCATION:   home   If you experience worsening of your admission symptoms, develop shortness of breath, life threatening emergency, suicidal or homicidal thoughts you must seek medical attention immediately by calling 911 or calling your MD immediately  if symptoms less severe.  You Must read complete instructions/literature along with all the possible adverse reactions/side effects for all the Medicines you take  and that have been prescribed to you. Take any new Medicines after you have completely understood and accpet all the possible adverse reactions/side effects.   Please note  You were cared for by a hospitalist during your hospital stay. If you have any questions about your discharge medications or the care you received while you were in the hospital after you are discharged, you can call the unit and asked to speak with the hospitalist on call if the hospitalist that took care of you is not available. Once you are discharged, your primary care physician will handle any further medical issues. Please note that NO REFILLS for any discharge medications will be authorized once you are discharged, as it is imperative that you return to your primary care physician (or establish a relationship with a primary care physician  if you do not have one) for your aftercare needs so that they can reassess your need for medications and monitor your lab values.  On the day of Discharge:  VITAL SIGNS:   Blood pressure 137/68, pulse 74, temperature 98 F (36.7 C), temperature source Oral, resp. rate 19, height 5' 5"  (1.651 m), weight 87.5 kg, SpO2 100 %.  PHYSICAL EXAMINATION:    GENERAL:  63 y.o.-year-old patient lying in the bed with no acute distress.  EYES: Pupils equal, round, reactive to light and accommodation. No scleral icterus. Extraocular muscles intact.  HEENT: Head atraumatic, normocephalic. Oropharynx and nasopharynx clear.  NECK:  Supple, no jugular venous distention. No thyroid enlargement, no tenderness.  LUNGS: Normal breath sounds bilaterally, no wheezing, rales,rhonchi or crepitation. No use of accessory muscles of respiration.  CARDIOVASCULAR: S1, S2 normal. No murmurs, rubs, or gallops.  ABDOMEN: Soft, non-tender, non-distended. Bowel sounds present. No organomegaly or mass.  EXTREMITIES: No pedal edema, cyanosis, or clubbing.  NEUROLOGIC: Cranial nerves II through XII are intact. Muscle  strength 5/5 in all extremities. Sensation intact. Gait not checked.  PSYCHIATRIC: The patient is alert and oriented x 3.  SKIN: No obvious rash, lesion, or ulcer.   DATA REVIEW:   CBC Recent Labs  Lab 06/27/18 0359  WBC 8.2  HGB 8.1*  HCT 27.7*  PLT 145*    Chemistries  Recent Labs  Lab 06/20/18 2038  06/27/18 0359  NA 135   < > 137  K 4.0   < > 4.4  CL 110   < > 107  CO2 17*   < > 21*  GLUCOSE 166*   < > 214*  BUN 26*   < > 31*  CREATININE 1.85*   < > 1.90*  CALCIUM 8.3*   < > 8.3*  AST 12*  --   --   ALT 8  --   --   ALKPHOS 94  --   --   BILITOT 0.4  --   --    < > = values in this interval not displayed.     Microbiology Results  Results for orders placed or performed during the hospital encounter of 06/20/18  SARS Coronavirus 2 (CEPHEID- Performed in [place] hospital lab), Hosp Order     Status: None   Collection Time: 06/20/18  9:40 PM  Result Value Ref Range Status   SARS Coronavirus 2 NEGATIVE NEGATIVE Final    Comment: (NOTE) If result is NEGATIVE SARS-CoV-2 target nucleic acids are NOT DETECTED. The SARS-CoV-2 RNA is generally detectable in upper and lower  respiratory specimens during the acute phase of infection. The lowest  concentration of SARS-CoV-2 viral copies this assay can detect is 250  copies / mL. A negative result does not preclude SARS-CoV-2 infection  and should not be used as the sole basis for treatment or other  patient management decisions.  A negative result may occur with  improper specimen collection / handling, submission of specimen other  than nasopharyngeal swab, presence of viral mutation(s) within the  areas targeted by this assay, and inadequate number of viral copies  (<250 copies / mL). A negative result must be combined with clinical  observations, patient history, and epidemiological information. If result is POSITIVE SARS-CoV-2 target nucleic acids are DETECTED. The SARS-CoV-2 RNA is generally detectable in  upper and lower  respiratory specimens dur ing the acute phase of infection.  Positive  results are indicative of active infection with SARS-CoV-2.  Clinical  correlation  with patient history and other diagnostic information is  necessary to determine patient infection status.  Positive results do  not rule out bacterial infection or co-infection with other viruses. If result is PRESUMPTIVE POSTIVE SARS-CoV-2 nucleic acids MAY BE PRESENT.   A presumptive positive result was obtained on the submitted specimen  and confirmed on repeat testing.  While 2019 novel coronavirus  (SARS-CoV-2) nucleic acids may be present in the submitted sample  additional confirmatory testing may be necessary for epidemiological  and / or clinical management purposes  to differentiate between  SARS-CoV-2 and other Sarbecovirus currently known to infect humans.  If clinically indicated additional testing with an alternate test  methodology [id number]) is advised. The SARS-CoV-2 RNA is generally  detectable in upper and lower respiratory sp ecimens during the acute  phase of infection. The expected result is Negative. Fact Sheet for Patients:  StrictlyIdeas.no Fact Sheet for Healthcare Providers: BankingDealers.co.za This test is not yet approved or cleared by the [place] FDA and has been authorized for detection and/or diagnosis of SARS-CoV-2 by FDA under an Emergency Use Authorization (EUA).  This EUA will remain in effect (meaning this test can be used) for the duration of the COVID-19 declaration under Section 564(b)(1) of the Act, 21 U.S.C. section 360bbb-3(b)(1), unless the authorization is terminated or revoked sooner. Performed at [facility], [address] [place], [place] [address]   CULTURE, BLOOD (ROUTINE X 2) w Reflex to ID Panel     Status: None   Collection Time: 06/21/18  2:17 AM  Result Value Ref Range Status   Specimen  Description BLOOD BLOOD RIGHT HAND  Final   Special Requests   Final    BOTTLES DRAWN AEROBIC AND ANAEROBIC Blood Culture adequate volume   Culture   Final    NO GROWTH 5 DAYS Performed at [facility], [address], [place], [place] [address]    Report Status 06/26/2018 FINAL  Final  CULTURE, BLOOD (ROUTINE X 2) w Reflex to ID Panel     Status: None   Collection Time: 06/21/18 12:44 PM  Result Value Ref Range Status   Specimen Description   Final    BLOOD BLOOD LEFT ARM Performed at [facility], [address], [place], [place] [address]    Special Requests   Final    BOTTLES DRAWN AEROBIC AND ANAEROBIC Blood Culture results may not be optimal due to an excessive volume of blood received in culture bottles Performed at [facility] [place], [address], [place], [place] [address]    Culture   Final    NO GROWTH 5 DAYS Performed at [facility], [address] [place], [place] [address]    Report Status 06/26/2018 FINAL  Final  MRSA PCR Screening     Status: Abnormal   Collection Time: 06/22/18 11:42 AM  Result Value Ref Range Status   MRSA by PCR POSITIVE (A) NEGATIVE Final    Comment:        The GeneXpert MRSA Assay (FDA approved for NASAL specimens only), is one component of a comprehensive MRSA colonization surveillance program. It is not intended to diagnose MRSA infection nor to guide or monitor treatment for MRSA infections. RESULT CALLED TO, READ BACK BY AND VERIFIED WITH: Gladstone Pih @1406  ON 06/22/2018 BY FMW Performed at [facility], [address] [place], [place] [address]   Aerobic/Anaerobic Culture (surgical/deep wound)     Status: None (Preliminary result)   Collection Time:  06/24/18  8:39 AM  Result Value Ref Range Status   Specimen Description   Final    WOUND BONE LEFT GREAT TOE Performed at [facility], [address], [place], [place] [address]    Special Requests   Final    NONE  Performed at [facility], [place], [place] [address]    Gram Stain   Final    MODERATE WBC PRESENT, PREDOMINANTLY PMN FEW GRAM POSITIVE COCCI Performed at [facility], [place] [address] [place], [place] [address]    Culture   Final    FEW METHICILLIN RESISTANT STAPHYLOCOCCUS AUREUS CULTURE REINCUBATED FOR BETTER GROWTH NO ANAEROBES ISOLATED; CULTURE IN PROGRESS FOR 5 DAYS    Report Status PENDING  Incomplete   Organism ID, Bacteria METHICILLIN RESISTANT STAPHYLOCOCCUS AUREUS  Final      Susceptibility   Methicillin resistant staphylococcus aureus - MIC*    CIPROFLOXACIN >=8 RESISTANT Resistant     ERYTHROMYCIN <=0.25 SENSITIVE Sensitive     GENTAMICIN <=0.5 SENSITIVE Sensitive     OXACILLIN >=4 RESISTANT Resistant     TETRACYCLINE <=1 SENSITIVE Sensitive     VANCOMYCIN 1 SENSITIVE Sensitive     TRIMETH/SULFA <=10 SENSITIVE Sensitive     CLINDAMYCIN 1 INTERMEDIATE Intermediate     RIFAMPIN <=0.5 SENSITIVE Sensitive     Inducible Clindamycin NEGATIVE Sensitive     * FEW METHICILLIN RESISTANT STAPHYLOCOCCUS AUREUS    RADIOLOGY:  No results found.   Management plans discussed with the patient, family and they are in agreement.  CODE STATUS:     Code Status Orders  (From admission, onward)         Start     Ordered   06/20/18 2301  Full code  Continuous     06/20/18 2312        Code Status History    Date Active Date Inactive Code Status Order ID Comments User Context   01/13/2018 1540 01/16/2018 1859 Full Code [id number]  Loletha Grayer, MD ED   11/03/2017 1758 11/06/2017 2136 Full Code [id number]  [name], MD Inpatient   09/26/2017 2353 09/30/2017 1404 Full Code [id number]  Lance Coon, MD Inpatient   08/27/2017 0347 08/29/2017 2054 Full Code [id number]  Harrie Foreman, MD Inpatient   08/07/2017 1704 08/09/2017 2237 Full Code [id number]  Saundra Shelling, MD Inpatient   12/18/2016 1146 12/26/2016 2032 Full Code [id number]   Vaughan Basta, MD Inpatient   12/18/2016 0959 12/18/2016 1146 Full Code [id number]  Vaughan Basta, MD ED   12/04/2015 0509 12/07/2015 1756 Full Code [id number]  Holley Raring, NP ED   10/28/2015 1640 10/29/2015 1711 Full Code [id number]  Bettey Costa, MD Inpatient   08/29/2015 1438 08/30/2015 1750 Full Code [id number]  [place], Ubaldo Glassing, DO Inpatient      TOTAL TIME TAKING CARE OF THIS PATIENT: 38 minutes.   This patient was staffed with Dr. Valetta Fuller, [facility] who personally evaluated patient, reviewed documentation and agreed with discharge plan of care as above.  Rufina Falco, DNP, FNP-BC Hospitalist Nurse Practitioner   06/27/2018 at 2:16 PM  Between 7am to 6pm - Pager - [phone]  After 6pm go to [email or web address] - Proofreader  [facility]  Office  [phone]  CC: Primary care physician; Patient, No Pcp Per   Note: This dictation was prepared with Dragon dictation along with smaller phrase technology. Any transcriptional errors that result from this process are unintentional.

## 2018-06-30 LAB — AEROBIC/ANAEROBIC CULTURE W GRAM STAIN (SURGICAL/DEEP WOUND)

## 2018-07-05 ENCOUNTER — Inpatient Hospital Stay: Payer: Medicaid Other | Admitting: Infectious Diseases

## 2018-07-06 ENCOUNTER — Other Ambulatory Visit: Payer: Self-pay

## 2018-07-06 ENCOUNTER — Ambulatory Visit (INDEPENDENT_AMBULATORY_CARE_PROVIDER_SITE_OTHER): Payer: Medicaid Other | Admitting: Gastroenterology

## 2018-07-06 DIAGNOSIS — D500 Iron deficiency anemia secondary to blood loss (chronic): Secondary | ICD-10-CM | POA: Diagnosis not present

## 2018-07-06 DIAGNOSIS — K250 Acute gastric ulcer with hemorrhage: Secondary | ICD-10-CM | POA: Diagnosis not present

## 2018-07-06 MED ORDER — OMEPRAZOLE 40 MG PO CPDR: 40.0000 mg | DELAYED_RELEASE_CAPSULE | Freq: Two times a day (BID) | ORAL | 2 refills | Status: DC

## 2018-07-06 NOTE — Progress Notes (Signed)
Sherri Sear, MD [address]  [place]  [place], [place] [address]  Main: [phone]  Fax: [phone]    Gastroenterology Consultation Tele Visit  Referring Provider:     No ref. provider found Primary Care Physician:  Patient, No Pcp Per Primary Gastroenterologist:  Dr. Bonna Gains Reason for Consultation:     Hospital follow-up, upper GI bleed, chronic iron deficiency anemia        HPI:   [name] is a 63 y.o. female referred by Dr. Patient, No Pcp Per  for consultation & management of chronic iron deficiency anemia  Virtual Visit via Telephone Note  I connected with Ralph Dowdy on 07/06/18 at  8:30 AM EDT by telephone and verified that I am speaking with the correct person using two identifiers.   I discussed the limitations, risks, security and privacy concerns of performing an evaluation and management service by telephone and the availability of in person appointments. I also discussed with the patient that there may be a patient responsible charge related to this service. The patient expressed understanding and agreed to proceed.  Location of the Patient: Home  Location of the provider: Office  Persons participating in the visit: Patient and provider only  History of Present Illness: [name] is a 63 y.o. 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  Patient is well-known to GI service, underwent EGD, colonoscopy,.  Capsule endoscopy, small bowel enteroscopy last year with treatment of gastric and small bowel AVM.  Patient was admitted to [facility] about 2 weeks ago secondary to osteomyelitis of her left great toe, severe symptomatic anemia.  She underwent EGD which revealed bleeding gastric ulcer that was treated with cautery.  Patient subsequently underwent amputation of her great toe before discharge.  She says she could not arrange transportation to come for  face-to-face office visit today.  Therefore, tele-visit is arranged  She reports taking omeprazole 40 mg twice daily  NSAIDs: None  Antiplts/Anticoagulants/Anti thrombotics: Plavix 75 mg  GI Procedures: She underwent EGD, colonoscopy, capsule endoscopy as well as small bowel enteroscopy in 2019, found to have gastric and small bowel AVMs which were treated Small bowel enteroscopy 06/2018 - The examined portion of the jejunum was normal. - Normal examined duodenum. - Oozing gastric ulcers with oozing hemorrhage (Forrest Class Ib). Treated with bipolar cautery. - Normal gastroesophageal junction and esophagus. - No specimens collected.  Past Medical History:  Diagnosis Date  . Cervical cancer ([place])   . CHF (congestive heart failure) ([place])   . COPD (chronic obstructive pulmonary disease) ([place])   . Diabetes mellitus without complication ([place])   . NSTEMI (non-ST elevated myocardial infarction) [facility])     Past Surgical History:  Procedure Laterality Date  . AMPUTATION Left 06/24/2018   Procedure: AMPUTATION GREAT TOE;  Surgeon: Samara Deist, DPM;  Location: [facility] ORS;  Service: Podiatry;  Laterality: Left;  . APPENDECTOMY    . CARDIAC CATHETERIZATION    . CHOLECYSTECTOMY    . COLONOSCOPY N/A 09/28/2017   Procedure: COLONOSCOPY;  Surgeon: Toledo, Benay Pike, MD;  Location: [facility] ENDOSCOPY;  Service: Gastroenterology;  Laterality: N/A;  . ENTEROSCOPY Left 11/06/2017   Procedure: ENTEROSCOPY;  Surgeon: Jonathon Bellows, MD;  Location: [facility] ENDOSCOPY;  Service: Gastroenterology;  Laterality: Left;  . ENTEROSCOPY N/A 06/22/2018   Procedure: ENTEROSCOPY;  Surgeon: Lin Landsman, MD;  Location: [facility] ENDOSCOPY;  Service: Gastroenterology;  Laterality: N/A;  .  ESOPHAGOGASTRODUODENOSCOPY N/A 12/19/2016   Procedure: ESOPHAGOGASTRODUODENOSCOPY (EGD);  Surgeon: Lin Landsman, MD;  Location: [facility] ENDOSCOPY;  Service: Gastroenterology;  Laterality: N/A;  . ESOPHAGOGASTRODUODENOSCOPY N/A 09/28/2017    Procedure: ESOPHAGOGASTRODUODENOSCOPY (EGD);  Surgeon: Toledo, Benay Pike, MD;  Location: [facility] ENDOSCOPY;  Service: Gastroenterology;  Laterality: N/A;  . ESOPHAGOGASTRODUODENOSCOPY (EGD) WITH PROPOFOL N/A 08/08/2017   Procedure: ESOPHAGOGASTRODUODENOSCOPY (EGD) WITH PROPOFOL;  Surgeon: [name], MD;  Location: [facility] ENDOSCOPY;  Service: Endoscopy;  Laterality: N/A;  . [place]    . LEFT HEART CATH AND CORONARY ANGIOGRAPHY N/A 12/23/2016   Procedure: LEFT HEART CATH AND CORONARY ANGIOGRAPHY;  Surgeon: Teodoro Spray, MD;  Location: [place] CV LAB;  Service: Cardiovascular;  Laterality: N/A;  . LOWER EXTREMITY ANGIOGRAPHY Left 06/26/2018   Procedure: Lower Extremity Angiography;  Surgeon: Katha Cabal, MD;  Location: [place] CV LAB;  Service: Cardiovascular;  Laterality: Left;     Current Outpatient Medications:  .  carvedilol (COREG) 3.125 MG tablet, Take 1 tablet (3.125 mg total) by mouth 2 (two) times daily with a meal., Disp: 60 tablet, Rfl: 0 .  clopidogrel (PLAVIX) 75 MG tablet, Take 1 tablet (75 mg total) by mouth daily with breakfast., Disp: 30 tablet, Rfl: 0 .  ferrous sulfate 325 (65 FE) MG tablet, Take 1 tablet (325 mg total) by mouth 2 (two) times daily with a meal., Disp: 30 tablet, Rfl: 3 .  folic acid (FOLVITE) 1 MG tablet, Take 1 tablet (1 mg total) by mouth daily., Disp: 30 tablet, Rfl: 0 .  furosemide (LASIX) 40 MG tablet, Take 1 tablet (40 mg total) by mouth daily., Disp: 30 tablet, Rfl: 11 .  Insulin Glargine (LANTUS SOLOSTAR) 100 UNIT/ML Solostar Pen, Inject 15 Units into the skin daily. (Patient taking differently: Inject 16 Units into the skin daily. ), Disp: 300 mL, Rfl: 0 .  Multiple Vitamin (MULTIVITAMIN WITH MINERALS) TABS tablet, Take 1 tablet by mouth daily., Disp: 30 tablet, Rfl: 0 .  omeprazole (PRILOSEC) 40 MG capsule, Take 1 capsule (40 mg total) by mouth 2 (two) times a day for 30 days., Disp: 60 capsule, Rfl: 2 .   ondansetron (ZOFRAN) 4 MG tablet, Take 1 tablet (4 mg total) by mouth every 6 (six) hours as needed for nausea., Disp: 20 tablet, Rfl: 0 .  pregabalin (LYRICA) 100 MG capsule, Take 1-2 capsules (100-200 mg total) by mouth See admin instructions. Take 1 capsule (100MG) by mouth every morning and 2 capsules (200MG) by mouth every night, Disp: 60 capsule, Rfl: 1 .  PROAIR HFA 108 (90 Base) MCG/ACT inhaler, Inhale 1-2 puffs into the lungs every 4 (four) hours as needed., Disp: , Rfl: 2 .  SYMBICORT 160-4.5 MCG/ACT inhaler, Inhale 2 puffs into the lungs 2 (two) times daily., Disp: , Rfl: 2 .  thiamine 100 MG tablet, Take 1 tablet (100 mg total) by mouth daily., Disp: 30 tablet, Rfl: 0 .  atorvastatin (LIPITOR) 40 MG tablet, Take 1 tablet (40 mg total) by mouth daily., Disp: 30 tablet, Rfl: 3 .  oxyCODONE (OXY IR/ROXICODONE) 5 MG immediate release tablet, Take 1-2 tablets (5-10 mg total) by mouth every 4 (four) hours as needed for moderate pain. (Patient not taking: Reported on 07/06/2018), Disp: 30 tablet, Rfl: 0   Family History  Problem Relation Age of Onset  . Hypertension Mother   . Diabetes Mellitus II Mother   . Breast cancer Mother   . Heart disease Father   . Cancer Sister  Social History   Tobacco Use  . Smoking status: Current Some Day Smoker    Packs/day: 0.25    Types: Cigarettes  . Smokeless tobacco: Never Used  Substance Use Topics  . Alcohol use: No  . Drug use: No    Allergies as of 07/06/2018 - Review Complete 07/06/2018  Allergen Reaction Noted  . Latex Anaphylaxis and Rash 11/07/2012  . Gabapentin Nausea And Vomiting and Other (See Comments) 12/20/2012     Imaging Studies: Reviewed  Assessment and Plan:   [name] is a 63 y.o. female with history of diabetes, chronic iron deficiency anemia secondary to gastric and small bowel AVMs with recent hospital admission secondary to sepsis from left foot great toe osteomyelitis, also presented with severe  symptomatic anemia secondary to blood loss from gastric ulcer, treated with cautery.  She should continue oral iron.  Recommend checking CBC.  Patient said she will try to get the blood test done next week   Follow Up Instructions:   I discussed the assessment and treatment plan with the patient. The patient was provided an opportunity to ask questions and all were answered. The patient agreed with the plan and demonstrated an understanding of the instructions.   The patient was advised to call back or seek an in-person evaluation if the symptoms worsen or if the condition fails to improve as anticipated.  I provided 15 minutes of non-face-to-face time during this encounter.   Follow up in 4-6 weeks   [name], MD

## 2018-07-17 IMAGING — DX DG CHEST 1V PORT
1 series · 1 of 1 positions shown · non-contrast
Comparison: 12/21/2016 and prior radiographs

CLINICAL DATA: Weakness.  Recent heart surgery.

EXAM:
PORTABLE CHEST 1 VIEW

[chest ap]
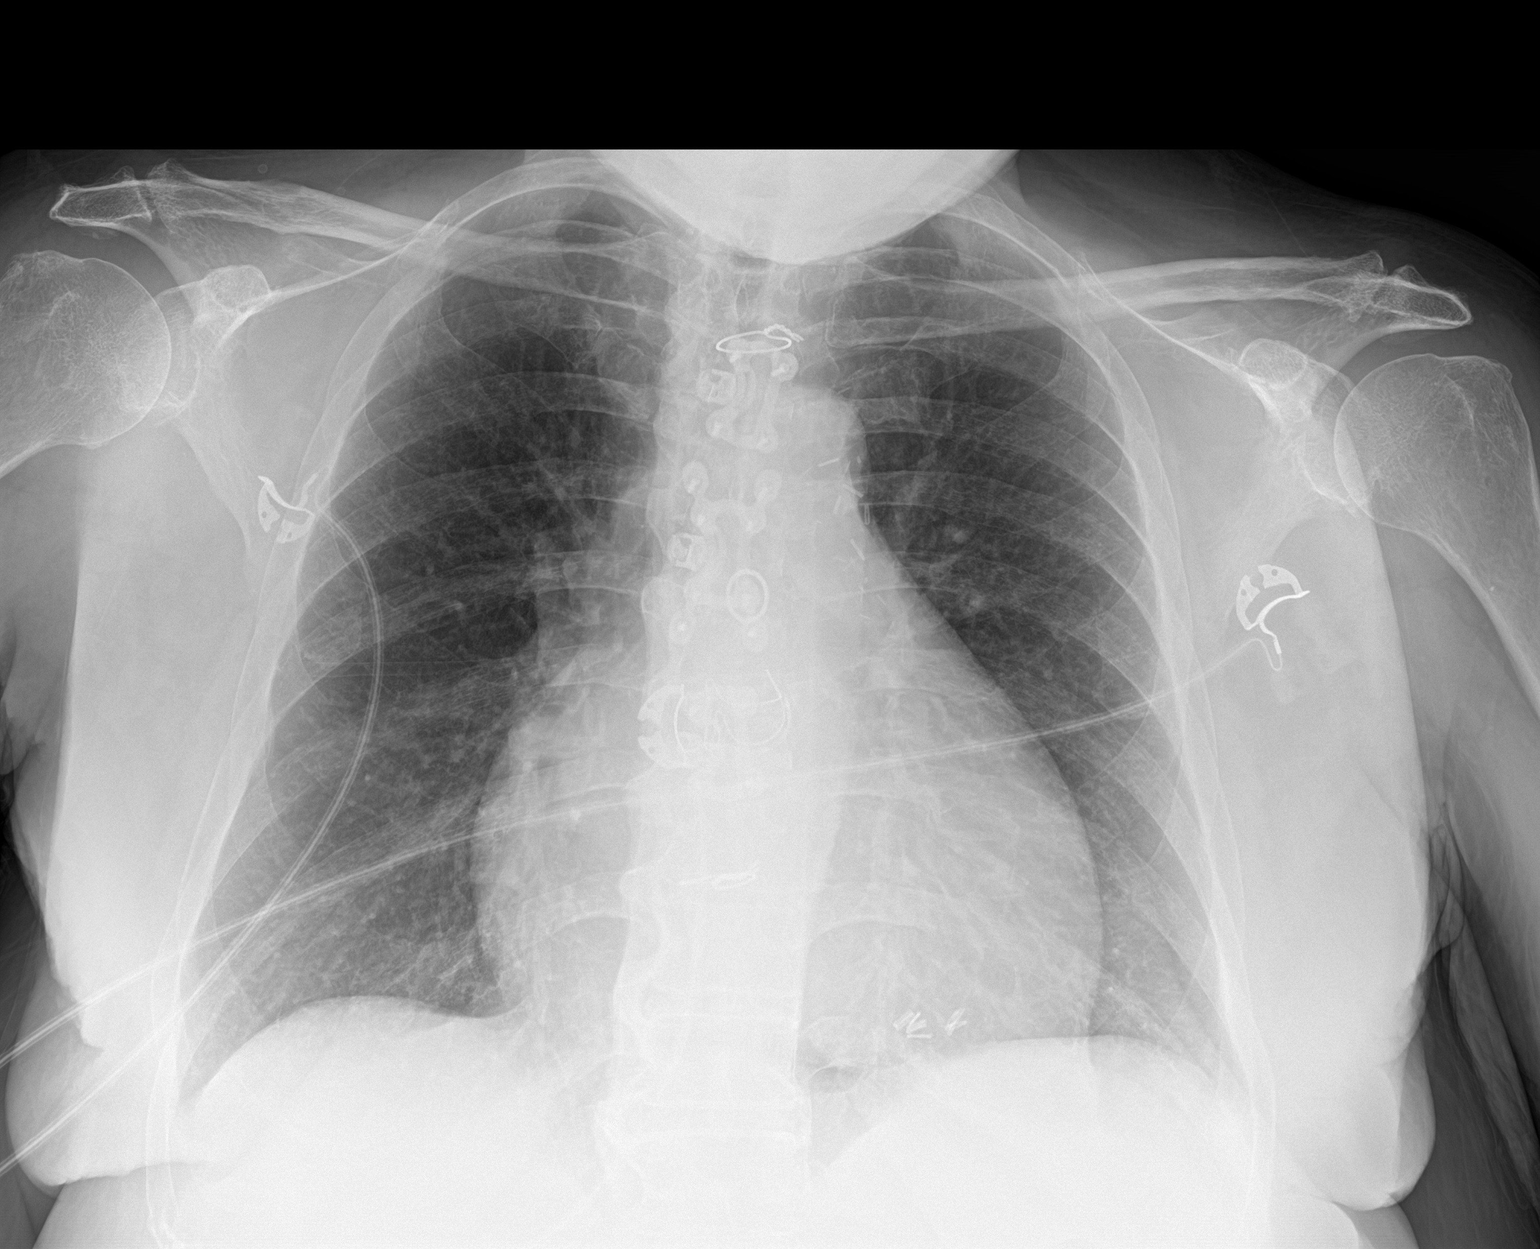

[1 of 1 positions shown; findings below may reference images not displayed]

FINDINGS: Cardiomegaly and cardiac valve replacement and surgical changes
noted.

There is no evidence of focal airspace disease, pulmonary edema,
suspicious pulmonary nodule/mass, pleural effusion, or pneumothorax.

No acute bony abnormalities are identified.
IMPRESSION: Cardiomegaly without evidence of acute cardiopulmonary disease.

## 2018-08-15 ENCOUNTER — Other Ambulatory Visit: Payer: Self-pay

## 2018-08-15 ENCOUNTER — Ambulatory Visit (INDEPENDENT_AMBULATORY_CARE_PROVIDER_SITE_OTHER): Payer: Medicaid Other | Admitting: Gastroenterology

## 2018-08-15 ENCOUNTER — Encounter: Payer: Self-pay | Admitting: Gastroenterology

## 2018-08-15 DIAGNOSIS — F419 Anxiety disorder, unspecified: Secondary | ICD-10-CM | POA: Insufficient documentation

## 2018-08-15 DIAGNOSIS — I10 Essential (primary) hypertension: Secondary | ICD-10-CM | POA: Insufficient documentation

## 2018-08-15 DIAGNOSIS — E669 Obesity, unspecified: Secondary | ICD-10-CM | POA: Insufficient documentation

## 2018-08-15 DIAGNOSIS — G8929 Other chronic pain: Secondary | ICD-10-CM | POA: Insufficient documentation

## 2018-08-15 DIAGNOSIS — D500 Iron deficiency anemia secondary to blood loss (chronic): Secondary | ICD-10-CM

## 2018-08-15 DIAGNOSIS — G4700 Insomnia, unspecified: Secondary | ICD-10-CM | POA: Insufficient documentation

## 2018-08-15 DIAGNOSIS — E785 Hyperlipidemia, unspecified: Secondary | ICD-10-CM | POA: Insufficient documentation

## 2018-08-15 DIAGNOSIS — G4730 Sleep apnea, unspecified: Secondary | ICD-10-CM | POA: Insufficient documentation

## 2018-08-15 DIAGNOSIS — Z8669 Personal history of other diseases of the nervous system and sense organs: Secondary | ICD-10-CM | POA: Insufficient documentation

## 2018-08-15 DIAGNOSIS — M5126 Other intervertebral disc displacement, lumbar region: Secondary | ICD-10-CM | POA: Insufficient documentation

## 2018-08-15 DIAGNOSIS — K250 Acute gastric ulcer with hemorrhage: Secondary | ICD-10-CM

## 2018-08-15 DIAGNOSIS — I517 Cardiomegaly: Secondary | ICD-10-CM | POA: Insufficient documentation

## 2018-08-15 DIAGNOSIS — K219 Gastro-esophageal reflux disease without esophagitis: Secondary | ICD-10-CM | POA: Insufficient documentation

## 2018-08-15 DIAGNOSIS — M549 Dorsalgia, unspecified: Secondary | ICD-10-CM | POA: Insufficient documentation

## 2018-08-15 MED ORDER — OMEPRAZOLE 40 MG PO CPDR: 40.0000 mg | DELAYED_RELEASE_CAPSULE | Freq: Two times a day (BID) | ORAL | 1 refills | Status: DC

## 2018-08-15 NOTE — Progress Notes (Addendum)
Tanya Sear, MD [address]  [place]  [place], [place] [address]  Main: [phone]  Fax: [phone]    Gastroenterology Consultation Tele Visit  Referring Provider:     No ref. provider found Primary Care Physician:  Patient, No Pcp Per Primary Gastroenterologist:  Dr. Bonna Gains Reason for Consultation:     Hospital follow-up, upper GI bleed, chronic iron deficiency anemia        HPI:   [name] is a 63 y.o. female referred by Dr. Patient, No Pcp Per  for consultation & management of chronic iron deficiency anemia  Virtual Visit via Telephone Note  I connected with Ralph Dowdy on 08/15/18 at 10:30 AM EDT by telephone and verified that I am speaking with the correct person using two identifiers.   I discussed the limitations, risks, security and privacy concerns of performing an evaluation and management service by telephone and the availability of in person appointments. I also discussed with the patient that there may be a patient responsible charge related to this service. The patient expressed understanding and agreed to proceed.  Location of the Patient: Home  Location of the provider: Office  Persons participating in the visit: Patient and provider only  History of Present Illness: [name] is a 63 y.o. 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  Patient is well-known to GI service, underwent EGD, colonoscopy,.  Capsule endoscopy, small bowel enteroscopy last year with treatment of gastric and small bowel AVM.  Patient was admitted to [facility] about 2 weeks ago secondary to osteomyelitis of her left great toe, severe symptomatic anemia.  She underwent EGD which revealed bleeding gastric ulcer that was treated with cautery.  Patient subsequently underwent amputation of her great toe before discharge.  She says she could not arrange transportation to come for  face-to-face office visit today.  Therefore, tele-visit is arranged  She reports taking omeprazole 40 mg twice daily  Follow-up phone visit 08/15/2018 She forgot about her tele visit today.  She reports that she has been feeling weaker lately.  Although, she denies any GI symptoms including abdominal pain, black stools, nausea or vomiting or rectal bleeding.  She did not get labs that I recommended from last visit.  She states she does not have transportation and does not have enough money to pay bills due to end of the month.  She is concerned about it again as she is not able to move around much since the amputation of her great toe  NSAIDs: None  Antiplts/Anticoagulants/Anti thrombotics: Plavix 75 mg  GI Procedures: She underwent EGD, colonoscopy, capsule endoscopy as well as small bowel enteroscopy in 2019, found to have gastric and small bowel AVMs which were treated Small bowel enteroscopy 06/2018 - The examined portion of the jejunum was normal. - Normal examined duodenum. - Oozing gastric ulcers with oozing hemorrhage (Forrest Class Ib). Treated with bipolar cautery. - Normal gastroesophageal junction and esophagus. - No specimens collected.  Past Medical History:  Diagnosis Date   Cervical cancer ([facility])    CHF (congestive heart failure) ([facility])    COPD (chronic obstructive pulmonary disease) ([place])    Diabetes mellitus without complication ([place])    NSTEMI (non-ST elevated myocardial infarction) [facility])     Past Surgical History:  Procedure Laterality Date   AMPUTATION Left 06/24/2018   Procedure: AMPUTATION GREAT TOE;  Surgeon: Samara Deist, DPM;  Location: [facility] ORS;  Service: Podiatry;  Laterality: Left;   APPENDECTOMY     CARDIAC CATHETERIZATION     CHOLECYSTECTOMY     COLONOSCOPY N/A 09/28/2017   Procedure: COLONOSCOPY;  Surgeon: Toledo, Benay Pike, MD;  Location: [facility] ENDOSCOPY;  Service: Gastroenterology;  Laterality: N/A;   ENTEROSCOPY Left 11/06/2017    Procedure: ENTEROSCOPY;  Surgeon: Jonathon Bellows, MD;  Location: [facility] ENDOSCOPY;  Service: Gastroenterology;  Laterality: Left;   ENTEROSCOPY N/A 06/22/2018   Procedure: ENTEROSCOPY;  Surgeon: Lin Landsman, MD;  Location: [facility] ENDOSCOPY;  Service: Gastroenterology;  Laterality: N/A;   ESOPHAGOGASTRODUODENOSCOPY N/A 12/19/2016   Procedure: ESOPHAGOGASTRODUODENOSCOPY (EGD);  Surgeon: Lin Landsman, MD;  Location: [facility] ENDOSCOPY;  Service: Gastroenterology;  Laterality: N/A;   ESOPHAGOGASTRODUODENOSCOPY N/A 09/28/2017   Procedure: ESOPHAGOGASTRODUODENOSCOPY (EGD);  Surgeon: Toledo, Benay Pike, MD;  Location: [facility] ENDOSCOPY;  Service: Gastroenterology;  Laterality: N/A;   ESOPHAGOGASTRODUODENOSCOPY (EGD) WITH PROPOFOL N/A 08/08/2017   Procedure: ESOPHAGOGASTRODUODENOSCOPY (EGD) WITH PROPOFOL;  Surgeon: [name], MD;  Location: [facility] ENDOSCOPY;  Service: Endoscopy;  Laterality: N/A;   [place]     LEFT HEART CATH AND CORONARY ANGIOGRAPHY N/A 12/23/2016   Procedure: LEFT HEART CATH AND CORONARY ANGIOGRAPHY;  Surgeon: Teodoro Spray, MD;  Location: [place] CV LAB;  Service: Cardiovascular;  Laterality: N/A;   LOWER EXTREMITY ANGIOGRAPHY Left 06/26/2018   Procedure: Lower Extremity Angiography;  Surgeon: Katha Cabal, MD;  Location: [place] CV LAB;  Service: Cardiovascular;  Laterality: Left;     Current Outpatient Medications:    carvedilol (COREG) 3.125 MG tablet, Take 1 tablet (3.125 mg total) by mouth 2 (two) times daily with a meal., Disp: 60 tablet, Rfl: 0   clopidogrel (PLAVIX) 75 MG tablet, Take 1 tablet (75 mg total) by mouth daily with breakfast., Disp: 30 tablet, Rfl: 0   ferrous sulfate 325 (65 FE) MG tablet, Take 1 tablet (325 mg total) by mouth 2 (two) times daily with a meal., Disp: 30 tablet, Rfl: 3   folic acid (FOLVITE) 1 MG tablet, Take 1 tablet (1 mg total) by mouth daily., Disp: 30 tablet, Rfl: 0   furosemide (LASIX) 40 MG  tablet, Take 1 tablet (40 mg total) by mouth daily., Disp: 30 tablet, Rfl: 11   Insulin Glargine (LANTUS SOLOSTAR) 100 UNIT/ML Solostar Pen, Inject 15 Units into the skin daily. (Patient taking differently: Inject 16 Units into the skin daily. ), Disp: 300 mL, Rfl: 0   Multiple Vitamin (MULTIVITAMIN WITH MINERALS) TABS tablet, Take 1 tablet by mouth daily., Disp: 30 tablet, Rfl: 0   ondansetron (ZOFRAN) 4 MG tablet, Take 1 tablet (4 mg total) by mouth every 6 (six) hours as needed for nausea., Disp: 20 tablet, Rfl: 0   pregabalin (LYRICA) 100 MG capsule, Take 1-2 capsules (100-200 mg total) by mouth See admin instructions. Take 1 capsule (100MG) by mouth every morning and 2 capsules (200MG) by mouth every night, Disp: 60 capsule, Rfl: 1   pregabalin (LYRICA) 150 MG capsule, Take 150 mg by mouth 2 (two) times daily., Disp: , Rfl:    PROAIR HFA 108 (90 Base) MCG/ACT inhaler, Inhale 1-2 puffs into the lungs every 4 (four) hours as needed., Disp: , Rfl: 2   SYMBICORT 160-4.5 MCG/ACT inhaler, Inhale 2 puffs into the lungs 2 (two) times daily., Disp: , Rfl: 2   thiamine 100 MG tablet, Take 1 tablet (100 mg total) by mouth daily., Disp: 30 tablet, Rfl: 0   atorvastatin (LIPITOR) 40 MG tablet, Take 1  tablet (40 mg total) by mouth daily., Disp: 30 tablet, Rfl: 3   omeprazole (PRILOSEC) 40 MG capsule, Take 1 capsule (40 mg total) by mouth 2 (two) times a day., Disp: 120 capsule, Rfl: 1   oxyCODONE (OXY IR/ROXICODONE) 5 MG immediate release tablet, Take 1-2 tablets (5-10 mg total) by mouth every 4 (four) hours as needed for moderate pain. (Patient not taking: Reported on 07/06/2018), Disp: 30 tablet, Rfl: 0   Family History  Problem Relation Age of Onset   Hypertension Mother    Diabetes Mellitus II Mother    Breast cancer Mother    Heart disease Father    Cancer Sister      Social History   Tobacco Use   Smoking status: Current Some Day Smoker    Packs/day: 0.25    Types: Cigarettes    Smokeless tobacco: Never Used  Substance Use Topics   Alcohol use: No   Drug use: No    Allergies as of 08/15/2018 - Review Complete 08/15/2018  Allergen Reaction Noted   Latex Anaphylaxis and Rash 11/07/2012   Gabapentin Nausea And Vomiting and Other (See Comments) 12/20/2012     Imaging Studies: Reviewed  Assessment and Plan:   [name] is a 63 y.o. female with history of diabetes, chronic iron deficiency anemia secondary to gastric and small bowel AVMs, left foot great toe osteomyelitis requiring amputation, severe symptomatic iron deficiency anemia secondary to blood loss from gastric ulcer, treated with cautery.  She should continue oral iron.  Recommend checking CBC and iron studies.  Patient said she will get the blood tests done in next 1-2 weeks  Refilled omeprazole   Follow Up Instructions:   I discussed the assessment and treatment plan with the patient. The patient was provided an opportunity to ask questions and all were answered. The patient agreed with the plan and demonstrated an understanding of the instructions.   The patient was advised to call back or seek an in-person evaluation if the symptoms worsen or if the condition fails to improve as anticipated.  I provided 15 minutes of non-face-to-face time during this encounter.   Follow up in 3 months or sooner   [name], MD

## 2018-08-17 ENCOUNTER — Emergency Department: Payer: Medicaid Other

## 2018-08-17 ENCOUNTER — Other Ambulatory Visit: Payer: Self-pay

## 2018-08-17 ENCOUNTER — Inpatient Hospital Stay
Admission: EM | Admit: 2018-08-17 | Discharge: 2018-08-22 | DRG: 378 | Disposition: A | Discharge status: Home Health | Payer: Medicaid Other | Attending: Internal Medicine | Admitting: Internal Medicine

## 2018-08-17 ENCOUNTER — Encounter: Payer: Self-pay | Admitting: Emergency Medicine

## 2018-08-17 DIAGNOSIS — Z86010 Personal history of colonic polyps: Secondary | ICD-10-CM

## 2018-08-17 DIAGNOSIS — N183 Chronic kidney disease, stage 3 (moderate): Secondary | ICD-10-CM | POA: Diagnosis present

## 2018-08-17 DIAGNOSIS — Z7902 Long term (current) use of antithrombotics/antiplatelets: Secondary | ICD-10-CM

## 2018-08-17 DIAGNOSIS — E1122 Type 2 diabetes mellitus with diabetic chronic kidney disease: Secondary | ICD-10-CM | POA: Diagnosis present

## 2018-08-17 DIAGNOSIS — E785 Hyperlipidemia, unspecified: Secondary | ICD-10-CM | POA: Diagnosis present

## 2018-08-17 DIAGNOSIS — Z833 Family history of diabetes mellitus: Secondary | ICD-10-CM

## 2018-08-17 DIAGNOSIS — Z888 Allergy status to other drugs, medicaments and biological substances status: Secondary | ICD-10-CM | POA: Diagnosis not present

## 2018-08-17 DIAGNOSIS — Z91040 Latex allergy status: Secondary | ICD-10-CM | POA: Diagnosis not present

## 2018-08-17 DIAGNOSIS — Z9049 Acquired absence of other specified parts of digestive tract: Secondary | ICD-10-CM

## 2018-08-17 DIAGNOSIS — J449 Chronic obstructive pulmonary disease, unspecified: Secondary | ICD-10-CM | POA: Diagnosis present

## 2018-08-17 DIAGNOSIS — I472 Ventricular tachycardia: Secondary | ICD-10-CM | POA: Diagnosis not present

## 2018-08-17 DIAGNOSIS — F17210 Nicotine dependence, cigarettes, uncomplicated: Secondary | ICD-10-CM | POA: Diagnosis present

## 2018-08-17 DIAGNOSIS — K648 Other hemorrhoids: Secondary | ICD-10-CM | POA: Diagnosis present

## 2018-08-17 DIAGNOSIS — I130 Hypertensive heart and chronic kidney disease with heart failure and stage 1 through stage 4 chronic kidney disease, or unspecified chronic kidney disease: Secondary | ICD-10-CM | POA: Diagnosis present

## 2018-08-17 DIAGNOSIS — K922 Gastrointestinal hemorrhage, unspecified: Secondary | ICD-10-CM | POA: Diagnosis present

## 2018-08-17 DIAGNOSIS — Z794 Long term (current) use of insulin: Secondary | ICD-10-CM | POA: Diagnosis not present

## 2018-08-17 DIAGNOSIS — I5022 Chronic systolic (congestive) heart failure: Secondary | ICD-10-CM | POA: Diagnosis present

## 2018-08-17 DIAGNOSIS — D649 Anemia, unspecified: Secondary | ICD-10-CM

## 2018-08-17 DIAGNOSIS — Z8711 Personal history of peptic ulcer disease: Secondary | ICD-10-CM

## 2018-08-17 DIAGNOSIS — I2510 Atherosclerotic heart disease of native coronary artery without angina pectoris: Secondary | ICD-10-CM | POA: Diagnosis present

## 2018-08-17 DIAGNOSIS — Z79899 Other long term (current) drug therapy: Secondary | ICD-10-CM | POA: Diagnosis not present

## 2018-08-17 DIAGNOSIS — Z89412 Acquired absence of left great toe: Secondary | ICD-10-CM

## 2018-08-17 DIAGNOSIS — Z20828 Contact with and (suspected) exposure to other viral communicable diseases: Secondary | ICD-10-CM | POA: Diagnosis present

## 2018-08-17 DIAGNOSIS — Z803 Family history of malignant neoplasm of breast: Secondary | ICD-10-CM

## 2018-08-17 DIAGNOSIS — I252 Old myocardial infarction: Secondary | ICD-10-CM

## 2018-08-17 DIAGNOSIS — R21 Rash and other nonspecific skin eruption: Secondary | ICD-10-CM | POA: Diagnosis not present

## 2018-08-17 DIAGNOSIS — Z8541 Personal history of malignant neoplasm of cervix uteri: Secondary | ICD-10-CM

## 2018-08-17 DIAGNOSIS — D500 Iron deficiency anemia secondary to blood loss (chronic): Secondary | ICD-10-CM | POA: Diagnosis present

## 2018-08-17 DIAGNOSIS — Z8249 Family history of ischemic heart disease and other diseases of the circulatory system: Secondary | ICD-10-CM

## 2018-08-17 DIAGNOSIS — K2961 Other gastritis with bleeding: Principal | ICD-10-CM | POA: Diagnosis present

## 2018-08-17 DIAGNOSIS — Z7951 Long term (current) use of inhaled steroids: Secondary | ICD-10-CM

## 2018-08-17 HISTORY — DX: Anemia, unspecified: D64.9

## 2018-08-17 HISTORY — DX: Gastrointestinal hemorrhage, unspecified: K92.2

## 2018-08-17 HISTORY — DX: Gastric ulcer, unspecified as acute or chronic, without hemorrhage or perforation: K25.9

## 2018-08-17 LAB — BASIC METABOLIC PANEL
Anion gap: 9 (ref 5–15)
BUN: 22 mg/dL (ref 8–23)
CO2: 21 mmol/L — ABNORMAL LOW (ref 22–32)
Calcium: 8.2 mg/dL — ABNORMAL LOW (ref 8.9–10.3)
Chloride: 107 mmol/L (ref 98–111)
Creatinine, Ser: 1.63 mg/dL — ABNORMAL HIGH (ref 0.44–1.00)
GFR calc Af Amer: 38 mL/min — ABNORMAL LOW (ref >60)
GFR calc non Af Amer: 33 mL/min — ABNORMAL LOW (ref >60)
Glucose, Bld: 169 mg/dL — ABNORMAL HIGH (ref 70–99)
Potassium: 4 mmol/L (ref 3.5–5.1)
Sodium: 137 mmol/L (ref 135–145)

## 2018-08-17 LAB — CBC WITH DIFFERENTIAL/PLATELET
Abs Immature Granulocytes: 0.06 10*3/uL (ref 0.00–0.07)
Basophils Absolute: 0 10*3/uL (ref 0.0–0.1)
Basophils Relative: 0 %
Eosinophils Absolute: 0.1 10*3/uL (ref 0.0–0.5)
Eosinophils Relative: 1 %
HCT: 18.7 % — ABNORMAL LOW (ref 36.0–46.0)
Hemoglobin: 5.1 g/dL — ABNORMAL LOW (ref 12.0–15.0)
Immature Granulocytes: 1 %
Lymphocytes Relative: 7 %
Lymphs Abs: 0.7 10*3/uL (ref 0.7–4.0)
MCH: 21.2 pg — ABNORMAL LOW (ref 26.0–34.0)
MCHC: 27.3 g/dL — ABNORMAL LOW (ref 30.0–36.0)
MCV: 77.6 fL — ABNORMAL LOW (ref 80.0–100.0)
Monocytes Absolute: 0.9 10*3/uL (ref 0.1–1.0)
Monocytes Relative: 9 %
Neutro Abs: 8.1 10*3/uL — ABNORMAL HIGH (ref 1.7–7.7)
Neutrophils Relative %: 82 %
Platelets: 210 10*3/uL (ref 150–400)
RBC: 2.41 MIL/uL — ABNORMAL LOW (ref 3.87–5.11)
RDW: 22.8 % — ABNORMAL HIGH (ref 11.5–15.5)
WBC: 9.8 10*3/uL (ref 4.0–10.5)
nRBC: 0 % (ref 0.0–0.2)

## 2018-08-17 LAB — SARS CORONAVIRUS 2 BY RT PCR (HOSPITAL ORDER, PERFORMED IN ~~LOC~~ HOSPITAL LAB): SARS Coronavirus 2: NEGATIVE

## 2018-08-17 LAB — BRAIN NATRIURETIC PEPTIDE: B Natriuretic Peptide: 861 pg/mL — ABNORMAL HIGH (ref 0.0–100.0)

## 2018-08-17 LAB — GLUCOSE, CAPILLARY
Glucose-Capillary: 160 mg/dL — ABNORMAL HIGH (ref 70–99)
Glucose-Capillary: 216 mg/dL — ABNORMAL HIGH (ref 70–99)

## 2018-08-17 LAB — PREPARE RBC (CROSSMATCH)

## 2018-08-17 LAB — PROTIME-INR
INR: 1.4 — ABNORMAL HIGH (ref 0.8–1.2)
Prothrombin Time: 17.2 seconds — ABNORMAL HIGH (ref 11.4–15.2)

## 2018-08-17 LAB — TROPONIN I (HIGH SENSITIVITY)
Troponin I (High Sensitivity): 15 ng/L (ref <18)
Troponin I (High Sensitivity): 14 ng/L (ref <18)

## 2018-08-17 MED ORDER — INSULIN ASPART 100 UNIT/ML ~~LOC~~ SOLN: 0.0000 [IU] | Freq: Every day | SUBCUTANEOUS | Status: DC

## 2018-08-17 MED ORDER — SODIUM CHLORIDE 0.9 % IV SOLN
8.0000 mg/h | INTRAVENOUS | Status: AC
  Administered 2018-08-18 – 2018-08-19 (×4): 8 mg/h via INTRAVENOUS
  Filled 2018-08-17 (×5): qty 80

## 2018-08-17 MED ORDER — INSULIN GLARGINE 100 UNIT/ML ~~LOC~~ SOLN
16.0000 [IU] | Freq: Every day | SUBCUTANEOUS | Status: DC
  Filled 2018-08-17 (×2): qty 0.16

## 2018-08-17 MED ORDER — PREGABALIN 50 MG PO CAPS: 100.0000 mg | ORAL_CAPSULE | ORAL | Status: DC

## 2018-08-17 MED ORDER — ONDANSETRON HCL 4 MG PO TABS: 4.0000 mg | ORAL_TABLET | Freq: Four times a day (QID) | ORAL | Status: DC | PRN

## 2018-08-17 MED ORDER — FUROSEMIDE 40 MG PO TABS
40.0000 mg | ORAL_TABLET | Freq: Every day | ORAL | Status: DC
  Administered 2018-08-17 – 2018-08-18 (×2): 40 mg via ORAL
  Filled 2018-08-17 (×2): qty 1

## 2018-08-17 MED ORDER — PANTOPRAZOLE SODIUM 40 MG IV SOLR
40.0000 mg | Freq: Two times a day (BID) | INTRAVENOUS | Status: DC
  Administered 2018-08-21 (×2): 40 mg via INTRAVENOUS
  Filled 2018-08-17 (×2): qty 40

## 2018-08-17 MED ORDER — INSULIN GLARGINE 100 UNIT/ML ~~LOC~~ SOLN
16.0000 [IU] | Freq: Every day | SUBCUTANEOUS | Status: DC
  Administered 2018-08-18 – 2018-08-20 (×4): 16 [IU] via SUBCUTANEOUS
  Filled 2018-08-17 (×6): qty 0.16

## 2018-08-17 MED ORDER — VITAMIN B-1 100 MG PO TABS
100.0000 mg | ORAL_TABLET | Freq: Every day | ORAL | Status: DC
  Administered 2018-08-18 – 2018-08-22 (×5): 100 mg via ORAL
  Filled 2018-08-17 (×5): qty 1

## 2018-08-17 MED ORDER — CARVEDILOL 6.25 MG PO TABS
3.1250 mg | ORAL_TABLET | Freq: Two times a day (BID) | ORAL | Status: DC
  Administered 2018-08-19 – 2018-08-22 (×7): 3.125 mg via ORAL
  Filled 2018-08-17 (×9): qty 1

## 2018-08-17 MED ORDER — ACETAMINOPHEN 650 MG RE SUPP: 650.0000 mg | Freq: Four times a day (QID) | RECTAL | Status: DC | PRN

## 2018-08-17 MED ORDER — PREGABALIN 75 MG PO CAPS
150.0000 mg | ORAL_CAPSULE | Freq: Two times a day (BID) | ORAL | Status: DC
  Administered 2018-08-17 – 2018-08-22 (×9): 150 mg via ORAL
  Filled 2018-08-17 (×9): qty 2

## 2018-08-17 MED ORDER — SODIUM CHLORIDE 0.9 % IV SOLN
INTRAVENOUS | Status: DC
  Administered 2018-08-18: 05:00:00 via INTRAVENOUS
  Administered 2018-08-18: 1000 mL via INTRAVENOUS

## 2018-08-17 MED ORDER — MOMETASONE FURO-FORMOTEROL FUM 200-5 MCG/ACT IN AERO
2.0000 | INHALATION_SPRAY | Freq: Two times a day (BID) | RESPIRATORY_TRACT | Status: DC
  Administered 2018-08-17 – 2018-08-22 (×9): 2 via RESPIRATORY_TRACT
  Filled 2018-08-17: qty 8.8

## 2018-08-17 MED ORDER — FOLIC ACID 1 MG PO TABS
1.0000 mg | ORAL_TABLET | Freq: Every day | ORAL | Status: DC
  Administered 2018-08-17 – 2018-08-22 (×6): 1 mg via ORAL
  Filled 2018-08-17 (×6): qty 1

## 2018-08-17 MED ORDER — ONDANSETRON HCL 4 MG/2ML IJ SOLN
4.0000 mg | Freq: Four times a day (QID) | INTRAMUSCULAR | Status: DC | PRN
  Administered 2018-08-19: 4 mg via INTRAVENOUS
  Filled 2018-08-17: qty 2

## 2018-08-17 MED ORDER — ALBUTEROL SULFATE HFA 108 (90 BASE) MCG/ACT IN AERS: 1.0000 | INHALATION_SPRAY | RESPIRATORY_TRACT | Status: DC | PRN

## 2018-08-17 MED ORDER — ALBUTEROL SULFATE (2.5 MG/3ML) 0.083% IN NEBU: 2.5000 mg | INHALATION_SOLUTION | RESPIRATORY_TRACT | Status: DC | PRN

## 2018-08-17 MED ORDER — FERROUS SULFATE 325 (65 FE) MG PO TABS
325.0000 mg | ORAL_TABLET | Freq: Two times a day (BID) | ORAL | Status: DC
  Administered 2018-08-19 – 2018-08-22 (×6): 325 mg via ORAL
  Filled 2018-08-17 (×8): qty 1

## 2018-08-17 MED ORDER — SODIUM CHLORIDE 0.9 % IV SOLN
10.0000 mL/h | Freq: Once | INTRAVENOUS | Status: AC
  Administered 2018-08-17: 10 mL/h via INTRAVENOUS

## 2018-08-17 MED ORDER — ACETAMINOPHEN 325 MG PO TABS
650.0000 mg | ORAL_TABLET | Freq: Four times a day (QID) | ORAL | Status: DC | PRN
  Administered 2018-08-18: 650 mg via ORAL
  Filled 2018-08-17: qty 2

## 2018-08-17 MED ORDER — INSULIN ASPART 100 UNIT/ML ~~LOC~~ SOLN
0.0000 [IU] | Freq: Three times a day (TID) | SUBCUTANEOUS | Status: DC
  Administered 2018-08-20 – 2018-08-22 (×3): 1 [IU] via SUBCUTANEOUS
  Filled 2018-08-17 (×3): qty 1

## 2018-08-17 MED ORDER — ATORVASTATIN CALCIUM 20 MG PO TABS
40.0000 mg | ORAL_TABLET | Freq: Every day | ORAL | Status: DC
  Administered 2018-08-17 – 2018-08-22 (×6): 40 mg via ORAL
  Filled 2018-08-17 (×7): qty 2

## 2018-08-17 MED ORDER — SODIUM CHLORIDE 0.9 % IV SOLN
80.0000 mg | Freq: Once | INTRAVENOUS | Status: AC
  Administered 2018-08-17: 80 mg via INTRAVENOUS
  Filled 2018-08-17 (×2): qty 80

## 2018-08-17 NOTE — ED Notes (Signed)
ED TO INPATIENT HANDOFF REPORT  ED Nurse Name and Phone #: [phone]  S Name/Age/Gender [name] 63 y.o. female Room/Bed: ED12A/ED12A  Code Status   Code Status: Prior  Home/SNF/Other Home A/Ox4 Is this baseline? Yes   Triage Complete: Triage complete  Chief Complaint leg swelling  Triage Note Pt to ED by EMS with c/o of bilateral leg swelling that has increased since having left great toe amputated 2 weeks ago. Pt also has c/o of dry cough and generalized body aches.    Allergies Allergies  Allergen Reactions  . Latex Anaphylaxis and Rash  . Gabapentin Nausea And Vomiting and Other (See Comments)    Level of Care/Admitting Diagnosis ED Disposition    ED Disposition Condition [facility] Area: [place] [[id number]]  Level of Care: Med-Surg [16]  Covid Evaluation: Asymptomatic Screening Protocol (No Symptoms)  Diagnosis: GI bleed [937902]  Admitting Physician: Dustin Flock [[id number]]  Attending Physician: Dustin Flock [[id number]]  Estimated length of stay: past midnight tomorrow  Certification:: I certify this patient will need inpatient services for at least 2 midnights  PT Class (Do Not Modify): Inpatient [101]  PT Acc Code (Do Not Modify): Private [1]       B Medical/Surgery History Past Medical History:  Diagnosis Date  . Cervical cancer ([place])   . CHF (congestive heart failure) ([place])   . Chronic anemia   . COPD (chronic obstructive pulmonary disease) ([place])   . Diabetes mellitus without complication ([place])   . Gastric ulcer   . NSTEMI (non-ST elevated myocardial infarction) ([place])   . Upper GI bleed    Past Surgical History:  Procedure Laterality Date  . AMPUTATION Left 06/24/2018   Procedure: AMPUTATION GREAT TOE;  Surgeon: Samara Deist, DPM;  Location: [facility] ORS;  Service: Podiatry;  Laterality: Left;  . APPENDECTOMY    . CARDIAC CATHETERIZATION    . CHOLECYSTECTOMY    . COLONOSCOPY N/A 09/28/2017   Procedure: COLONOSCOPY;  Surgeon: Toledo, Benay Pike, MD;  Location: [facility] ENDOSCOPY;  Service: Gastroenterology;  Laterality: N/A;  . ENTEROSCOPY Left 11/06/2017   Procedure: ENTEROSCOPY;  Surgeon: Jonathon Bellows, MD;  Location: [facility] ENDOSCOPY;  Service: Gastroenterology;  Laterality: Left;  . ENTEROSCOPY N/A 06/22/2018   Procedure: ENTEROSCOPY;  Surgeon: Lin Landsman, MD;  Location: [facility] ENDOSCOPY;  Service: Gastroenterology;  Laterality: N/A;  . ESOPHAGOGASTRODUODENOSCOPY N/A 12/19/2016   Procedure: ESOPHAGOGASTRODUODENOSCOPY (EGD);  Surgeon: Lin Landsman, MD;  Location: [facility] ENDOSCOPY;  Service: Gastroenterology;  Laterality: N/A;  . ESOPHAGOGASTRODUODENOSCOPY N/A 09/28/2017   Procedure: ESOPHAGOGASTRODUODENOSCOPY (EGD);  Surgeon: Toledo, Benay Pike, MD;  Location: [facility] ENDOSCOPY;  Service: Gastroenterology;  Laterality: N/A;  . ESOPHAGOGASTRODUODENOSCOPY (EGD) WITH PROPOFOL N/A 08/08/2017   Procedure: ESOPHAGOGASTRODUODENOSCOPY (EGD) WITH PROPOFOL;  Surgeon: [name], MD;  Location: [facility] ENDOSCOPY;  Service: Endoscopy;  Laterality: N/A;  . [place]    . LEFT HEART CATH AND CORONARY ANGIOGRAPHY N/A 12/23/2016   Procedure: LEFT HEART CATH AND CORONARY ANGIOGRAPHY;  Surgeon: Teodoro Spray, MD;  Location: [place] CV LAB;  Service: Cardiovascular;  Laterality: N/A;  . LOWER EXTREMITY ANGIOGRAPHY Left 06/26/2018   Procedure: Lower Extremity Angiography;  Surgeon: Katha Cabal, MD;  Location: [place] CV LAB;  Service: Cardiovascular;  Laterality: Left;     A IV Location/Drains/Wounds Patient Lines/Drains/Airways Status   Active Line/Drains/Airways    Name:   Placement date:   Placement time:   Site:   Days:   Peripheral  IV 08/17/18 Left Antecubital   08/17/18    -    Antecubital   less than 1   Incision (Closed) 06/24/18 Foot Left   06/24/18    0846     54   Wound / Incision (Open or Dehisced) 06/21/18 Diabetic ulcer Left Great Toe   06/21/18     0230    -   95   Wound / Incision (Open or Dehisced) 06/21/18 Diabetic ulcer Toe (Comment  which one) Posterior;Lateral;Left 4th    06/21/18    0230    Toe (Comment  which one)   57   Wound / Incision (Open or Dehisced) 06/21/18 Diabetic ulcer Toe (Comment  which one) Left;Posterior 5th toe   06/21/18    0230    Toe (Comment  which one)   57          Intake/Output Last 24 hours No intake or output data in the 24 hours ending 08/17/18 1647  Labs/Imaging Results for orders placed or performed during the hospital encounter of 08/17/18 (from the past 48 hour(s))  Brain natriuretic peptide     Status: Abnormal   Collection Time: 08/17/18 10:30 AM  Result Value Ref Range   B Natriuretic Peptide 861.0 (H) 0.0 - 100.0 pg/mL    Comment: Performed at [facility], [address] [place], [place] [address]  Basic metabolic panel     Status: Abnormal   Collection Time: 08/17/18 12:54 PM  Result Value Ref Range   Sodium 137 135 - 145 mmol/L   Potassium 4.0 3.5 - 5.1 mmol/L   Chloride 107 98 - 111 mmol/L   CO2 21 (L) 22 - 32 mmol/L   Glucose, Bld 169 (H) 70 - 99 mg/dL   BUN 22 8 - 23 mg/dL   Creatinine, Ser 1.63 (H) 0.44 - 1.00 mg/dL   Calcium 8.2 (L) 8.9 - 10.3 mg/dL   GFR calc non Af Amer 33 (L) >60 mL/min   GFR calc Af Amer 38 (L) >60 mL/min   Anion gap 9 5 - 15    Comment: Performed at [facility], [address] [place],  [address]  CBC with Differential     Status: Abnormal   Collection Time: 08/17/18 12:54 PM  Result Value Ref Range   WBC 9.8 4.0 - 10.5 K/uL   RBC 2.41 (L) 3.87 - 5.11 MIL/uL   Hemoglobin 5.1 (L) 12.0 - 15.0 g/dL    Comment: Reticulocyte Hemoglobin testing may be clinically indicated, consider ordering this additional test [id number]    HCT 18.7 (L) 36.0 - 46.0 %   MCV 77.6 (L) 80.0 - 100.0 fL   MCH 21.2 (L) 26.0 - 34.0 pg   MCHC 27.3 (L) 30.0 - 36.0 g/dL   RDW 22.8 (H) 11.5 - 15.5 %   Platelets 210 150 - 400 K/uL   nRBC 0.0 0.0 -  0.2 %   Neutrophils Relative % 82 %   Neutro Abs 8.1 (H) 1.7 - 7.7 K/uL   Lymphocytes Relative 7 %   Lymphs Abs 0.7 0.7 - 4.0 K/uL   Monocytes Relative 9 %   Monocytes Absolute 0.9 0.1 - 1.0 K/uL   Eosinophils Relative 1 %   Eosinophils Absolute 0.1 0.0 - 0.5 K/uL   Basophils Relative 0 %   Basophils Absolute 0.0 0.0 - 0.1 K/uL   RBC Morphology MIXED RBC POPULATION    Immature Granulocytes 1 %   Abs Immature Granulocytes 0.06 0.00 - 0.07  K/uL    Comment: Performed at [facility], [place], [place] [address]  Troponin I (High Sensitivity)     Status: None   Collection Time: 08/17/18 12:54 PM  Result Value Ref Range   Troponin I (High Sensitivity) 15 <18 ng/L    Comment: (NOTE) Elevated high sensitivity troponin I (hsTnI) values and significant  changes across serial measurements may suggest ACS but many other  chronic and acute conditions are known to elevate hsTnI results.  Refer to the "Links" section for chest pain algorithms and additional  guidance. Performed at [facility], [address] [place], [place] [address]   Protime-INR     Status: Abnormal   Collection Time: 08/17/18 12:54 PM  Result Value Ref Range   Prothrombin Time 17.2 (H) 11.4 - 15.2 seconds   INR 1.4 (H) 0.8 - 1.2    Comment: (NOTE) INR goal varies based on device and disease states. Performed at [facility], [place], [place] [address]   Troponin I (High Sensitivity)     Status: None   Collection Time: 08/17/18  2:01 PM  Result Value Ref Range   Troponin I (High Sensitivity) 14 <18 ng/L    Comment: (NOTE) Elevated high sensitivity troponin I (hsTnI) values and significant  changes across serial measurements may suggest ACS but many other  chronic and acute conditions are known to elevate hsTnI results.  Refer to the "Links" section for chest pain algorithms and additional  guidance. Performed at [facility], [address] [place], [place] [address]   Prepare RBC     Status: None (Preliminary result)   Collection Time: 08/17/18  3:04 PM  Result Value Ref Range   Order Confirmation      ORDER PROCESSED BY BLOOD BANK Performed at [facility], [address] [place], [place] [address]   SARS Coronavirus 2 [facility] order, Performed in [facility] hospital lab) Nasopharyngeal Nasopharyngeal Swab     Status: None   Collection Time: 08/17/18  3:04 PM   Specimen: Nasopharyngeal Swab  Result Value Ref Range   SARS Coronavirus 2 NEGATIVE NEGATIVE    Comment: (NOTE) If result is NEGATIVE SARS-CoV-2 target nucleic acids are NOT DETECTED. The SARS-CoV-2 RNA is generally detectable in upper and lower  respiratory specimens during the acute phase of infection. The lowest  concentration of SARS-CoV-2 viral copies this assay can detect is 250  copies / mL. A negative result does not preclude SARS-CoV-2 infection  and should not be used as the sole basis for treatment or other  patient management decisions.  A negative result may occur with  improper specimen collection / handling, submission of specimen other  than nasopharyngeal swab, presence of viral mutation(s) within the  areas targeted by this assay, and inadequate number of viral copies  (<250 copies / mL). A negative result must be combined with clinical  observations, patient history, and epidemiological information. If result is POSITIVE SARS-CoV-2 target nucleic acids are DETECTED. The SARS-CoV-2 RNA is generally detectable in upper and lower  respiratory specimens dur ing the acute phase of infection.  Positive  results are indicative of active infection with SARS-CoV-2.  Clinical  correlation with patient history and other diagnostic information is  necessary to determine patient infection status.  Positive results do  not rule out bacterial infection or co-infection with other viruses. If result is PRESUMPTIVE POSTIVE SARS-CoV-2  nucleic acids MAY BE PRESENT.   A presumptive  positive result was obtained on the submitted specimen  and confirmed on repeat testing.  While 2019 novel coronavirus  (SARS-CoV-2) nucleic acids may be present in the submitted sample  additional confirmatory testing may be necessary for epidemiological  and / or clinical management purposes  to differentiate between  SARS-CoV-2 and other Sarbecovirus currently known to infect humans.  If clinically indicated additional testing with an alternate test  methodology [phone]) is advised. The SARS-CoV-2 RNA is generally  detectable in upper and lower respiratory sp ecimens during the acute  phase of infection. The expected result is Negative. Fact Sheet for Patients:  StrictlyIdeas.no Fact Sheet for Healthcare Providers: BankingDealers.co.za This test is not yet approved or cleared by the [place] FDA and has been authorized for detection and/or diagnosis of SARS-CoV-2 by FDA under an Emergency Use Authorization (EUA).  This EUA will remain in effect (meaning this test can be used) for the duration of the COVID-19 declaration under Section 564(b)(1) of the Act, 21 U.S.C. section 360bbb-3(b)(1), unless the authorization is terminated or revoked sooner. Performed at [facility], [address] [place], [place] [address]   Type and screen [place]     Status: None (Preliminary result)   Collection Time: 08/17/18  3:04 PM  Result Value Ref Range   ABO/RH(D) O POS    Antibody Screen NEG    Sample Expiration      08/20/2018,2359 Performed at [facility], [address] Madelaine Bhat [place], [place] [address]    Unit Number [id number]    Blood Component Type RED CELLS,LR    Unit division 00    Status of Unit ALLOCATED    Transfusion Status OK TO TRANSFUSE    Crossmatch Result Compatible    Unit Number [id number]    Blood Component Type RED CELLS,LR     Unit division 00    Status of Unit ALLOCATED    Transfusion Status OK TO TRANSFUSE    Crossmatch Result Compatible   Glucose, capillary     Status: Abnormal   Collection Time: 08/17/18  4:32 PM  Result Value Ref Range   Glucose-Capillary 160 (H) 70 - 99 mg/dL   Dg Chest 2 View  Result Date: 08/17/2018 CLINICAL DATA:  Smoker, EMS with c/o of bilateral leg swelling that has increased since having left great toe amputated 2 weeks ago. Pt also has c/o of dry cough and generalized body achesPt has had numerous heart surgeries in the past EXAM: CHEST - 2 VIEW COMPARISON:  01/13/2018 FINDINGS: Moderate enlargement of the cardiopericardial silhouette. There are changes from prior cardiac surgery and aortic valve replacement. No mediastinal or hilar masses. No evidence of adenopathy. Mild prominence of the bronchovascular and interstitial markings, similar to prior studies. No convincing pulmonary edema. No evidence of pneumonia. No pleural effusion or pneumothorax. Skeletal structures are demineralized but intact. IMPRESSION: 1. No acute cardiopulmonary disease. 2. Moderate cardiomegaly with stable changes from prior cardiac surgery. Electronically Signed   By: Lajean Manes M.D.   On: 08/17/2018 12:59   US Venous Img Lower Unilateral Left  Result Date: 08/17/2018 CLINICAL DATA:  Left lower extremity swelling. EXAM: LEFT LOWER EXTREMITY VENOUS DOPPLER ULTRASOUND TECHNIQUE: Gray-scale sonography with graded compression, as well as color Doppler and duplex ultrasound were performed to evaluate the lower extremity deep venous systems from the level of the common femoral vein and including the common femoral, femoral, profunda femoral, popliteal and calf veins including the posterior tibial, peroneal and gastrocnemius veins when  visible. The superficial great saphenous vein was also interrogated. Spectral Doppler was utilized to evaluate flow at rest and with distal augmentation maneuvers in the common  femoral, femoral and popliteal veins. COMPARISON:  Ultrasound 01/13/2018. FINDINGS: Contralateral Common Femoral Vein: Respiratory phasicity is normal and symmetric with the symptomatic side. No evidence of thrombus. Normal compressibility. Common Femoral Vein: No evidence of thrombus. Normal compressibility, respiratory phasicity and response to augmentation. Saphenofemoral Junction: No evidence of thrombus. Normal compressibility and flow on color Doppler imaging. Profunda Femoral Vein: No evidence of thrombus. Normal compressibility and flow on color Doppler imaging. Femoral Vein: No evidence of thrombus. Normal compressibility, respiratory phasicity and response to augmentation. Popliteal Vein: No evidence of thrombus. Normal compressibility, respiratory phasicity and response to augmentation. Calf Veins: Limited evaluation. Where visualized normal color flow on color Doppler imaging. Other Findings: Prominent lymph nodes noted in the left groin. Exam was limited due to patient body habitus and lower extremity edema. IMPRESSION: 1.  No evidence of deep venous thrombosis. 2. Prominent lymph nodes in the groin. Exam was limited due to patient's body habitus and lower extremity edema. Electronically Signed   By: Marcello Moores  Register   On: 08/17/2018 12:51    Pending Labs Unresulted Labs (From admission, onward)    Start     Ordered   08/17/18 1153  Urinalysis, Complete w Microscopic  ONCE - STAT,   STAT     08/17/18 1153   Signed and Held  CBC  Tomorrow morning,   R     Signed and Held   Signed and Held  Basic metabolic panel  Tomorrow morning,   R     Signed and Held          Vitals/Pain Today's Vitals   08/17/18 1430 08/17/18 1530 08/17/18 1600 08/17/18 1630  BP: 129/70 131/64 135/61 125/61  Pulse:  94 88   Resp: 16 10 19 18   Temp:      TempSrc:      SpO2:  97% 100%   Weight:      Height:      PainSc:        Isolation Precautions No active isolations  Medications Medications  0.9 %   sodium chloride infusion (has no administration in time range)  pantoprazole (PROTONIX) 80 mg in sodium chloride 0.9 % 100 mL IVPB (80 mg Intravenous New Bag/Given 08/17/18 1630)  pantoprazole (PROTONIX) 80 mg in sodium chloride 0.9 % 250 mL (0.32 mg/mL) infusion (has no administration in time range)  pantoprazole (PROTONIX) injection 40 mg (has no administration in time range)  insulin aspart (novoLOG) injection 0-9 Units (has no administration in time range)  insulin aspart (novoLOG) injection 0-5 Units (has no administration in time range)    Mobility walks with device Low fall risk   Focused Assessments    R Recommendations: See Admitting Provider Note  Report given to:   Additional Notes:

## 2018-08-17 NOTE — ED Provider Notes (Signed)
[facility] Emergency Department Provider Note ____________________________________________   First MD Initiated Contact with Patient 08/17/18 1123     (approximate)  I have reviewed the triage vital signs and the nursing notes.   HISTORY  Chief Complaint Leg Swelling and Cough    HPI [name] is a 63 y.o. female with PMH as noted below who presents with bilateral lower extremity swelling, worse on the left, gradual onset over the last few weeks, and associated with some body aches, generalized weakness, and a nonproductive cough.  The patient states that she had her left great toe amputated a few weeks ago and all of the symptoms have worsened since she left the hospital.  She has not followed up with anyone since she left the hospital and has not had the dressing changed.  Past Medical History:  Diagnosis Date   Cervical cancer ([place])    CHF (congestive heart failure) (HCC)    COPD (chronic obstructive pulmonary disease) ([place])    Diabetes mellitus without complication ([place])    NSTEMI (non-ST elevated myocardial infarction) [facility])     Patient Active Problem List   Diagnosis Date Noted   Anxiety 08/15/2018   Chronic back pain 08/15/2018   GERD (gastroesophageal reflux disease) 08/15/2018   History of carpal tunnel syndrome 08/15/2018   HNP (herniated nucleus pulposus), lumbar 08/15/2018   Hyperlipidemia 08/15/2018   Hypertension 08/15/2018   Insomnia 08/15/2018   Left ventricular hypertrophy 08/15/2018   Obesity 08/15/2018   Sleep apnea 08/15/2018   Severe anemia 06/20/2018   Diabetic polyneuropathy associated with type 2 diabetes mellitus ([place]) 02/23/2018   Moderate tricuspid regurgitation 02/18/2018   Acute on chronic diastolic CHF (congestive heart failure) ([place]) 02/08/2018   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   Depression 02/05/2018   Generalized abdominal pain 02/05/2018   CHF (congestive heart  failure) ([place]) 01/13/2018   B12 deficiency 12/07/2017   Iron deficiency anemia due to chronic blood loss 12/07/2017   Folate deficiency 12/07/2017   GIB (gastrointestinal bleeding) 11/03/2017   COPD (chronic obstructive pulmonary disease) ([place]) 09/26/2017   Symptomatic anemia 09/26/2017   Acute on chronic systolic CHF (congestive heart failure) ([place]) 08/27/2017   Anemia    Weakness    GI bleed 96/29/5284   Chronic systolic heart failure ([place]) 12/28/2016   Diabetes ([place]) 12/28/2016   Tobacco use 12/28/2016   NSTEMI (non-ST elevated myocardial infarction) ([place])    Sepsis ([place]) 12/18/2016   CHF exacerbation ([place]) 12/04/2015   PNA (pneumonia) 10/28/2015   COPD exacerbation ([place]) 08/29/2015   Chronic hepatitis C without hepatic coma ([place]) 05/21/2014   CAD (coronary artery disease) 07/19/2013   Right arm pain 06/26/2013   Cervical pain (neck) 03/28/2013   Radiculopathy affecting upper extremity 03/28/2013   Impaired renal function 12/21/2012   Abnormal mammogram 12/20/2012   Diabetes mellitus type 2, uncontrolled ([place]) 12/20/2012    Past Surgical History:  Procedure Laterality Date   AMPUTATION Left 06/24/2018   Procedure: AMPUTATION GREAT TOE;  Surgeon: Samara Deist, DPM;  Location: [facility] ORS;  Service: Podiatry;  Laterality: Left;   APPENDECTOMY     CARDIAC CATHETERIZATION     CHOLECYSTECTOMY     COLONOSCOPY N/A 09/28/2017   Procedure: COLONOSCOPY;  Surgeon: Toledo, Benay Pike, MD;  Location: [facility] ENDOSCOPY;  Service: Gastroenterology;  Laterality: N/A;   ENTEROSCOPY Left 11/06/2017   Procedure: ENTEROSCOPY;  Surgeon: Jonathon Bellows, MD;  Location: [facility] ENDOSCOPY;  Service: Gastroenterology;  Laterality: Left;   ENTEROSCOPY N/A 06/22/2018  Procedure: ENTEROSCOPY;  Surgeon: Lin Landsman, MD;  Location: [facility] ENDOSCOPY;  Service: Gastroenterology;  Laterality: N/A;   ESOPHAGOGASTRODUODENOSCOPY N/A 12/19/2016   Procedure: ESOPHAGOGASTRODUODENOSCOPY  (EGD);  Surgeon: Lin Landsman, MD;  Location: [facility] ENDOSCOPY;  Service: Gastroenterology;  Laterality: N/A;   ESOPHAGOGASTRODUODENOSCOPY N/A 09/28/2017   Procedure: ESOPHAGOGASTRODUODENOSCOPY (EGD);  Surgeon: Toledo, Benay Pike, MD;  Location: [facility] ENDOSCOPY;  Service: Gastroenterology;  Laterality: N/A;   ESOPHAGOGASTRODUODENOSCOPY (EGD) WITH PROPOFOL N/A 08/08/2017   Procedure: ESOPHAGOGASTRODUODENOSCOPY (EGD) WITH PROPOFOL;  Surgeon: [name], MD;  Location: [facility] ENDOSCOPY;  Service: Endoscopy;  Laterality: N/A;   heart [place]     LEFT HEART CATH AND CORONARY ANGIOGRAPHY N/A 12/23/2016   Procedure: LEFT HEART CATH AND CORONARY ANGIOGRAPHY;  Surgeon: Teodoro Spray, MD;  Location: [place] CV LAB;  Service: Cardiovascular;  Laterality: N/A;   LOWER EXTREMITY ANGIOGRAPHY Left 06/26/2018   Procedure: Lower Extremity Angiography;  Surgeon: Katha Cabal, MD;  Location: [place] CV LAB;  Service: Cardiovascular;  Laterality: Left;    Prior to Admission medications   Medication Sig Start Date End Date Taking? Authorizing Provider  atorvastatin (LIPITOR) 40 MG tablet Take 1 tablet (40 mg total) by mouth daily. 01/16/18 06/27/18  Gladstone Lighter, MD  carvedilol (COREG) 3.125 MG tablet Take 1 tablet (3.125 mg total) by mouth 2 (two) times daily with a meal. 08/29/17   Dustin Flock, MD  clopidogrel (PLAVIX) 75 MG tablet Take 1 tablet (75 mg total) by mouth daily with breakfast. 06/28/18   Lang Snow, NP  ferrous sulfate 325 (65 FE) MG tablet Take 1 tablet (325 mg total) by mouth 2 (two) times daily with a meal. 09/30/17   Epifanio Lesches, MD  folic acid (FOLVITE) 1 MG tablet Take 1 tablet (1 mg total) by mouth daily. 09/30/17   Epifanio Lesches, MD  furosemide (LASIX) 40 MG tablet Take 1 tablet (40 mg total) by mouth daily. 01/16/18 01/16/19  Gladstone Lighter, MD  Insulin Glargine (LANTUS SOLOSTAR) 100 UNIT/ML Solostar Pen Inject 15  Units into the skin daily. Patient taking differently: Inject 16 Units into the skin daily.  08/29/17   Dustin Flock, MD  Multiple Vitamin (MULTIVITAMIN WITH MINERALS) TABS tablet Take 1 tablet by mouth daily. 06/28/18   Lang Snow, NP  omeprazole (PRILOSEC) 40 MG capsule Take 1 capsule (40 mg total) by mouth 2 (two) times a day. 08/15/18 10/14/18  Lin Landsman, MD  ondansetron (ZOFRAN) 4 MG tablet Take 1 tablet (4 mg total) by mouth every 6 (six) hours as needed for nausea. 08/29/17   Dustin Flock, MD  oxyCODONE (OXY IR/ROXICODONE) 5 MG immediate release tablet Take 1-2 tablets (5-10 mg total) by mouth every 4 (four) hours as needed for moderate pain. Patient not taking: Reported on 07/06/2018 06/27/18   Lang Snow, NP  pregabalin (LYRICA) 100 MG capsule Take 1-2 capsules (100-200 mg total) by mouth See admin instructions. Take 1 capsule (100MG) by mouth every morning and 2 capsules (200MG) by mouth every night 01/16/18   Gladstone Lighter, MD  pregabalin (LYRICA) 150 MG capsule Take 150 mg by mouth 2 (two) times daily. 08/03/18   [provider]  PROAIR HFA 108 (90 Base) MCG/ACT inhaler Inhale 1-2 puffs into the lungs every 4 (four) hours as needed. 10/10/17   [provider]  SYMBICORT 160-4.5 MCG/ACT inhaler Inhale 2 puffs into the lungs 2 (two) times daily. 10/10/17   [provider]  thiamine 100 MG tablet  Take 1 tablet (100 mg total) by mouth daily. 06/28/18   Lang Snow, NP    Allergies Latex and Gabapentin  Family History  Problem Relation Age of Onset   Hypertension Mother    Diabetes Mellitus II Mother    Breast cancer Mother    Heart disease Father    Cancer Sister     Social History Social History   Tobacco Use   Smoking status: Current Some Day Smoker    Packs/day: 0.25    Types: Cigarettes   Smokeless tobacco: Never Used  Substance Use Topics   Alcohol use: No   Drug use: No    Review  of Systems  Constitutional: No fever.  Positive for fatigue. Eyes: No redness. ENT: No sore throat. Cardiovascular: Denies chest pain. Respiratory: Positive for shortness of breath. Gastrointestinal: No vomiting or diarrhea.  Genitourinary: Negative for dysuria.  Musculoskeletal: Negative for back pain.  Positive for bilateral lower extremity pain. Skin: Negative for rash. Neurological: Negative for headache.   ____________________________________________   PHYSICAL EXAM:  VITAL SIGNS: ED Triage Vitals  Enc Vitals Group     BP 08/17/18 1033 139/69     Pulse Rate 08/17/18 1033 94     Resp 08/17/18 1033 18     Temp 08/17/18 1033 98.9 F (37.2 C)     Temp Source 08/17/18 1033 Oral     SpO2 08/17/18 1028 97 %     Weight 08/17/18 1034 200 lb (90.7 kg)     Height 08/17/18 1034 5' 3"  (1.6 m)     Head Circumference --      Peak Flow --      Pain Score 08/17/18 1033 8     Pain Loc --      Pain Edu? --      Excl. in [place]? --     Constitutional: Alert and oriented.  Relatively comfortable appearing and in no acute distress. Eyes: Conjunctivae are normal.  Head: Atraumatic. Nose: No congestion/rhinnorhea. Mouth/Throat: Mucous membranes are moist.   Neck: Normal range of motion.  Cardiovascular: Normal rate, regular rhythm. Grossly normal heart sounds.  Good peripheral circulation. Respiratory: Normal respiratory effort.  No retractions. Lungs CTAB. Gastrointestinal: No distention.  Musculoskeletal: 2+ lower extremity edema on the left, 1+ on the right.  Extremities warm and well perfused.  Left great toe incision clean dry and intact.  No open wounds.  No erythema, induration, or abnormal warmth.  2+ DP pulses bilaterally. Neurologic:  Normal speech and language. No gross focal neurologic deficits are appreciated.  Skin:  Skin is warm and dry. No rash noted. Psychiatric: Mood and affect are normal. Speech and behavior are normal.  ____________________________________________     LABS (all labs ordered are listed, but only abnormal results are displayed)  Labs Reviewed  BASIC METABOLIC PANEL - Abnormal; Notable for the following components:      Result Value   CO2 21 (*)    Glucose, Bld 169 (*)    Creatinine, Ser 1.63 (*)    Calcium 8.2 (*)    GFR calc non Af Amer 33 (*)    GFR calc Af Amer 38 (*)    All other components within normal limits  CBC WITH DIFFERENTIAL/PLATELET - Abnormal; Notable for the following components:   RBC 2.41 (*)    Hemoglobin 5.1 (*)    HCT 18.7 (*)    MCV 77.6 (*)    MCH 21.2 (*)    MCHC 27.3 (*)  RDW 22.8 (*)    Neutro Abs 8.1 (*)    All other components within normal limits  BRAIN NATRIURETIC PEPTIDE - Abnormal; Notable for the following components:   B Natriuretic Peptide 861.0 (*)    All other components within normal limits  PROTIME-INR - Abnormal; Notable for the following components:   Prothrombin Time 17.2 (*)    INR 1.4 (*)    All other components within normal limits  SARS CORONAVIRUS 2 (HOSPITAL ORDER, PERFORMED IN [facility])  URINALYSIS, COMPLETE (UACMP) WITH MICROSCOPIC  PREPARE RBC (CROSSMATCH)  TYPE AND SCREEN  TROPONIN I (HIGH SENSITIVITY)  TROPONIN I (HIGH SENSITIVITY)   ____________________________________________  EKG  ED ECG REPORT I, Arta Silence, the attending physician, personally viewed and interpreted this ECG.  Date: 08/17/2018 EKG Time: 1030 Rate: 93 Rhythm: normal sinus rhythm QRS Axis: normal Intervals: RBBB ST/T Wave abnormalities: LVH with repolarization abnormality Narrative Interpretation: Nonspecific T wave abnormalities with no evidence of acute ischemia  ____________________________________________  RADIOLOGY  CXR: No focal infiltrate or significant edema US venous LLE: No acute DVT  ____________________________________________   PROCEDURES  Procedure(s) performed: No  Procedures  Critical Care performed:  No ____________________________________________   INITIAL IMPRESSION / ASSESSMENT AND PLAN / ED COURSE  Pertinent labs & imaging results that were available during my care of the patient were reviewed by me and considered in my medical decision making (see chart for details).  63 year old female with PMH as noted above presents with worsening bilateral lower extremity edema and pain especially on the left where she had a recent great toe amputation.  She also reports some shortness of breath and generalized fatigue.  I reviewed the past medical records in [place].  Although the patient talks about being admitted a few weeks ago, it was actually in early June.  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  She was also treated for CHF and anemia.  The patient reports that she has not followed up with anyone since she left the hospital because she is unable to get transportation.  She states she has been compliant with her medications including her 40 mg furosemide daily.  On exam she is relatively comfortable appearing.  She has no respiratory distress or increased work of breathing.  She does have bilateral lower extremity edema which is worse on the left.  The amputation site actually appears clean and intact despite the fact that I took off the dressing which had been on for 6 weeks.  There is no evidence of wound infection or cellulitis.  She has intact distal pulses bilaterally.  Overall I suspect most likely symptoms related to CHF.  Given the swelling worse on the left I will obtain a DVT study as well, although clinically there is no evidence for PE.  Differential also includes pneumonia, bronchitis, recurrent anemia, renal insufficiency or other metabolic etiology.  We will obtain chest x-ray, lab work-up, and reassess.  ----------------------------------------- 3:00 PM on  08/17/2018 -----------------------------------------  Labs reveal significant recurrent anemia which was thought to be due to chronic blood loss before.  The patient will need to be admitted for transfusion and further monitoring.  Chest x-ray shows no evidence of acute pulmonary edema and the DVT study is negative.  I ordered a blood transfusion and I signed the patient out to the hospitalist Dr. Posey Pronto for admission.  _____________________________  Ralph Dowdy was evaluated in Emergency Department on 08/17/2018  for the symptoms described in the history of present illness. She was evaluated in the context of the global COVID-19 pandemic, which necessitated consideration that the patient might be at risk for infection with the SARS-CoV-2 virus that causes COVID-19. Institutional protocols and algorithms that pertain to the evaluation of patients at risk for COVID-19 are in a state of rapid change based on information released by regulatory bodies including the CDC and federal and state organizations. These policies and algorithms were followed during the patient's care in the ED.  ____________________________________________   FINAL CLINICAL IMPRESSION(S) / ED DIAGNOSES  Final diagnoses:  Iron deficiency anemia due to chronic blood loss      NEW MEDICATIONS STARTED DURING THIS VISIT:  New Prescriptions   No medications on file     Note:  This document was prepared using Dragon voice recognition software and may include unintentional dictation errors.    Arta Silence, MD 08/17/18 1500

## 2018-08-17 NOTE — ED Triage Notes (Signed)
Pt to ED by EMS with c/o of bilateral leg swelling that has increased since having left great toe amputated 2 weeks ago. Pt also has c/o of dry cough and generalized body aches.

## 2018-08-17 NOTE — Consult Note (Signed)
[name], MD [address] [place], [place], [place], [address] [address], [place], [place], [place], [address] Phone: [phone]  Fax: [phone]  Consultation  Referring Provider:     Dr. Posey Pronto Primary Care Physician:  Patient, No Pcp Per Reason for Consultation:    Anemia  Date of Admission:  08/17/2018 Date of Consultation:  08/17/2018         HPI:   [name] is a 63 y.o. female with history of iron deficiency anemia, previously evaluated by GI with multiple procedures now admitted with acute on chronic anemia.  Denies any episodes of active GI bleeding.  Reports fatigue for the last 4 to 7 days.  Last procedure was push enteroscopy by Dr. Marius Ditch on June 22, 2018.  Few superficial gastric ulcers with oozing were seen and treated with cautery.  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  Please see procedure step for other prior procedures.  As per consult note by Dr. Ricky Stabs. In September 2019, patient underwent cardiac cath in December 2018, showing severe stenosis and CABG was recommended but was not done.  "She was admitted to hospital in July and underwent EGD while in the hospital by Dr. Allen Norris on 07/23 which showed gastritis. EGD while admitted in December performed 12/19/2016..showedsuperficial gastric erosions from NG traumawith adherent blood clot, treated with heater probe..No colonoscopy was performed."  Last EGD September 2019 by Dr. Alice Reichert, normal exam. Colonoscopy September 2019 by Dr. Alice Reichert, internal hemorrhoids, 5 mm sigmoid polyp removed.  Past Medical History:  Diagnosis Date   Cervical cancer (HCC)    CHF (congestive heart failure) (HCC)    Chronic anemia    COPD (chronic obstructive pulmonary disease) (HCC)    Diabetes mellitus without complication (HCC)    Gastric ulcer    NSTEMI (non-ST elevated myocardial infarction) ([place])    Upper GI bleed     Past Surgical History:    Procedure Laterality Date   AMPUTATION Left 06/24/2018   Procedure: AMPUTATION GREAT TOE;  Surgeon: Samara Deist, DPM;  Location: [facility] ORS;  Service: Podiatry;  Laterality: Left;   APPENDECTOMY     CARDIAC CATHETERIZATION     CHOLECYSTECTOMY     COLONOSCOPY N/A 09/28/2017   Procedure: COLONOSCOPY;  Surgeon: Toledo, Benay Pike, MD;  Location: [facility] ENDOSCOPY;  Service: Gastroenterology;  Laterality: N/A;   ENTEROSCOPY Left 11/06/2017   Procedure: ENTEROSCOPY;  Surgeon: Jonathon Bellows, MD;  Location: [facility] ENDOSCOPY;  Service: Gastroenterology;  Laterality: Left;   ENTEROSCOPY N/A 06/22/2018   Procedure: ENTEROSCOPY;  Surgeon: Lin Landsman, MD;  Location: [facility] ENDOSCOPY;  Service: Gastroenterology;  Laterality: N/A;   ESOPHAGOGASTRODUODENOSCOPY N/A 12/19/2016   Procedure: ESOPHAGOGASTRODUODENOSCOPY (EGD);  Surgeon: Lin Landsman, MD;  Location: [facility] ENDOSCOPY;  Service: Gastroenterology;  Laterality: N/A;   ESOPHAGOGASTRODUODENOSCOPY N/A 09/28/2017   Procedure: ESOPHAGOGASTRODUODENOSCOPY (EGD);  Surgeon: Toledo, Benay Pike, MD;  Location: [facility] ENDOSCOPY;  Service: Gastroenterology;  Laterality: N/A;   ESOPHAGOGASTRODUODENOSCOPY (EGD) WITH PROPOFOL N/A 08/08/2017   Procedure: ESOPHAGOGASTRODUODENOSCOPY (EGD) WITH PROPOFOL;  Surgeon: [name], MD;  Location: [facility] ENDOSCOPY;  Service: Endoscopy;  Laterality: N/A;   [place]     LEFT HEART CATH AND CORONARY ANGIOGRAPHY N/A 12/23/2016   Procedure: LEFT HEART CATH AND CORONARY ANGIOGRAPHY;  Surgeon: Teodoro Spray, MD;  Location: [facility];  Service: Cardiovascular;  Laterality: N/A;   LOWER EXTREMITY ANGIOGRAPHY Left 06/26/2018   Procedure: Lower Extremity Angiography;  Surgeon: Katha Cabal, MD;  Location: [facility];  Service: Cardiovascular;  Laterality: Left;    Prior to Admission medications   Medication Sig Start Date End Date Taking? Authorizing Provider  clopidogrel  (PLAVIX) 75 MG tablet Take 1 tablet (75 mg total) by mouth daily with breakfast. 06/28/18  Yes Lang Snow, NP  furosemide (LASIX) 40 MG tablet Take 1 tablet (40 mg total) by mouth daily. Patient taking differently: Take 40 mg by mouth 2 (two) times daily.  01/16/18 01/16/19 Yes Gladstone Lighter, MD  Insulin Glargine (LANTUS SOLOSTAR) 100 UNIT/ML Solostar Pen Inject 15 Units into the skin daily. Patient taking differently: Inject 16 Units into the skin at bedtime.  08/29/17  Yes Dustin Flock, MD  Multiple Vitamin (MULTIVITAMIN WITH MINERALS) TABS tablet Take 1 tablet by mouth daily. 06/28/18  Yes Lang Snow, NP  omeprazole (PRILOSEC) 40 MG capsule Take 1 capsule (40 mg total) by mouth 2 (two) times a day. 08/15/18 10/14/18 Yes Vanga, Tally Due, MD  pregabalin (LYRICA) 150 MG capsule Take 150 mg by mouth 2 (two) times daily. 08/03/18  Yes [provider]  PROAIR HFA 108 (90 Base) MCG/ACT inhaler Inhale 1-2 puffs into the lungs every 4 (four) hours as needed. 10/10/17  Yes [provider]  SYMBICORT 160-4.5 MCG/ACT inhaler Inhale 2 puffs into the lungs 2 (two) times daily. 10/10/17  Yes [provider]  thiamine 100 MG tablet Take 1 tablet (100 mg total) by mouth daily. 06/28/18  Yes Lang Snow, NP    Family History  Problem Relation Age of Onset   Hypertension Mother    Diabetes Mellitus II Mother    Breast cancer Mother    Heart disease Father    Cancer Sister      Social History   Tobacco Use   Smoking status: Current Some Day Smoker    Packs/day: 0.25    Types: Cigarettes   Smokeless tobacco: Never Used  Substance Use Topics   Alcohol use: No   Drug use: No    Allergies as of 08/17/2018 - Review Complete 08/17/2018  Allergen Reaction Noted   Latex Anaphylaxis and Rash 11/07/2012   Gabapentin Nausea And Vomiting and Other (See Comments) 12/20/2012    Review of Systems:    All systems reviewed and  negative except where noted in HPI.   Physical Exam:  Vital signs in last 24 hours: Vitals:   08/17/18 1430 08/17/18 1530 08/17/18 1600 08/17/18 1630  BP: 129/70 131/64 135/61 125/61  Pulse:  94 88   Resp: 16 10 19 18   Temp:      TempSrc:      SpO2:  97% 100%   Weight:      Height:         General:   Pleasant, cooperative in NAD Head:  Normocephalic and atraumatic. Eyes:   No icterus.   Conjunctiva pink. PERRLA. Ears:  Normal auditory acuity. Neck:  Supple; no masses or thyroidomegaly Lungs: Respirations even and unlabored. Lungs clear to auscultation bilaterally.   No wheezes, crackles, or rhonchi.  Abdomen:  Soft, nondistended, nontender. Normal bowel sounds. No appreciable masses or hepatomegaly.  No rebound or guarding.  Neurologic:  Alert and oriented x3;  grossly normal neurologically. Skin:  Intact without significant lesions or rashes. Cervical Nodes:  No significant cervical adenopathy. Psych:  Alert and cooperative. Normal affect.  LAB RESULTS: Recent Labs    08/17/18 1254  WBC 9.8  HGB 5.1*  HCT  18.7*  PLT 210   BMET Recent Labs    08/17/18 1254  NA 137  K 4.0  CL 107  CO2 21*  GLUCOSE 169*  BUN 22  CREATININE 1.63*  CALCIUM 8.2*   LFT No results for input(s): PROT, ALBUMIN, AST, ALT, ALKPHOS, BILITOT, BILIDIR, IBILI in the last 72 hours. PT/INR Recent Labs    08/17/18 1254  LABPROT 17.2*  INR 1.4*    STUDIES: Dg Chest 2 View  Result Date: 08/17/2018 CLINICAL DATA:  Smoker, EMS with c/o of bilateral leg swelling that has increased since having left great toe amputated 2 weeks ago. Pt also has c/o of dry cough and generalized body achesPt has had numerous heart surgeries in the past EXAM: CHEST - 2 VIEW COMPARISON:  01/13/2018 FINDINGS: Moderate enlargement of the cardiopericardial silhouette. There are changes from prior cardiac surgery and aortic valve replacement. No mediastinal or hilar masses. No evidence of adenopathy. Mild prominence of  the bronchovascular and interstitial markings, similar to prior studies. No convincing pulmonary edema. No evidence of pneumonia. No pleural effusion or pneumothorax. Skeletal structures are demineralized but intact. IMPRESSION: 1. No acute cardiopulmonary disease. 2. Moderate cardiomegaly with stable changes from prior cardiac surgery. Electronically Signed   By: Lajean Manes M.D.   On: 08/17/2018 12:59   US Venous Img Lower Unilateral Left  Result Date: 08/17/2018 CLINICAL DATA:  Left lower extremity swelling. EXAM: LEFT LOWER EXTREMITY VENOUS DOPPLER ULTRASOUND TECHNIQUE: Gray-scale sonography with graded compression, as well as color Doppler and duplex ultrasound were performed to evaluate the lower extremity deep venous systems from the level of the common femoral vein and including the common femoral, femoral, profunda femoral, popliteal and calf veins including the posterior tibial, peroneal and gastrocnemius veins when visible. The superficial great saphenous vein was also interrogated. Spectral Doppler was utilized to evaluate flow at rest and with distal augmentation maneuvers in the common femoral, femoral and popliteal veins. COMPARISON:  Ultrasound 01/13/2018. FINDINGS: Contralateral Common Femoral Vein: Respiratory phasicity is normal and symmetric with the symptomatic side. No evidence of thrombus. Normal compressibility. Common Femoral Vein: No evidence of thrombus. Normal compressibility, respiratory phasicity and response to augmentation. Saphenofemoral Junction: No evidence of thrombus. Normal compressibility and flow on color Doppler imaging. Profunda Femoral Vein: No evidence of thrombus. Normal compressibility and flow on color Doppler imaging. Femoral Vein: No evidence of thrombus. Normal compressibility, respiratory phasicity and response to augmentation. Popliteal Vein: No evidence of thrombus. Normal compressibility, respiratory phasicity and response to augmentation. Calf Veins:  Limited evaluation. Where visualized normal color flow on color Doppler imaging. Other Findings: Prominent lymph nodes noted in the left groin. Exam was limited due to patient body habitus and lower extremity edema. IMPRESSION: 1.  No evidence of deep venous thrombosis. 2. Prominent lymph nodes in the groin. Exam was limited due to patient's body habitus and lower extremity edema. Electronically Signed   By: Marcello Moores  Register   On: 08/17/2018 12:51      Impression / Plan:   [name] is a 63 y.o. 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, with small oozing superficial gastric ulcers recently treated in June 2020 admitted with acute on chronic anemia with no active GI bleeding  Patient states due to her fatigue she has not taken any of her medications in 3 days, including her Plavix  Would recommend medical optimization at this time with IV  fluid resuscitation and PRBC transfusion as needed for her anemia  PPI IV twice daily  Continue serial CBCs and transfuse PRN Avoid NSAIDs Maintain 2 large-bore IV lines Please page GI with any acute hemodynamic changes, or signs of active GI bleeding  We will plan for upper endoscopy tomorrow with Dr. Vicente Males as he is on call.  N.p.o. past midnight   Thank you for involving me in the care of this patient.      LOS: 0 days   Virgel Manifold, MD  08/17/2018, 4:39 PM

## 2018-08-17 NOTE — ED Notes (Signed)
Patient transported to Ultrasound 

## 2018-08-17 NOTE — ED Notes (Signed)
IV team RN, Shelly at bedside. RN unable to place PIV at this time.

## 2018-08-17 NOTE — Progress Notes (Signed)
Advanced care plan.  Purpose of the Encounter: CODE STATUS  Parties in Attendance: Patient herself  Patient's Decision Capacity: Intact  Subjective/Patient's story: [name]  is a 63 y.o. female with a known history of chronic systolic CHF,, recurrent anemia, diabetes type 2, chronic kidney disease and history of gastric ulcer who was hospitalized last month and was discharged home.  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  Patient did not follow-up with the podiatry.  Patient did receive a telephone consult, call from GI 2 days ago.  Patient presents with generalized weakness and fatigue needs.    Objective/Medical story I discussed with the patient regarding her desires for cardiac and pulmonary resuscitation.  Patient states that she would like everything to be done and wants to be a full code   Goals of care determination:  Full code   CODE STATUS: Full code   Time spent discussing advanced care planning: 16 minutes

## 2018-08-17 NOTE — Care Management (Signed)
Hx of [facility] in June. I have left message for Lorrie D with [facility] to update on status.

## 2018-08-17 NOTE — Progress Notes (Signed)
VAST consulted for 2nd IV access for blood. Assessed bilateral arms: bilateral lower arms without veins appropriate for USG access.  20g IV in [place] working well. RUA assessed utilizing [place]; cephalic vein too small for USG access. Basilic vein deep and appropriate for midline, however, this RN cannot place midlines. Educated ER RN that 20g in [place] can be used for blood transfusion and midline can be placed next shift for administration of other meds if still needed.

## 2018-08-17 NOTE — H&P (Signed)
[name] at [place] NAME: [name]    MR#:  [id number]  DATE OF BIRTH:  1955/12/27  DATE OF ADMISSION:  08/17/2018  PRIMARY CARE PHYSICIAN: Patient, No Pcp Per   REQUESTING/REFERRING PHYSICIAN:  Arta Silence, MD     CHIEF COMPLAINT:   Chief Complaint  Patient presents with  . Leg Swelling  . Cough    HISTORY OF PRESENT ILLNESS: [name]  is a 63 y.o. female with a known history of chronic systolic CHF,, recurrent anemia, diabetes type 2, chronic kidney disease and history of gastric ulcer who was hospitalized last month and was discharged home.  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  Patient did not follow-up with the podiatry.  Patient did receive a telephone consult, call from GI 2 days ago.  Patient presents with generalized weakness and fatigue needs.  In the emergency room patient again noted to be severely anemic.  She denies any throwing up of blood or blood in her stool.  She denies any chest pain or palpitation does complain of lower extremity swelling.        PAST MEDICAL HISTORY:   Past Medical History:  Diagnosis Date  . Cervical cancer ([place])   . CHF (congestive heart failure) ([place])   . Chronic anemia   . COPD (chronic obstructive pulmonary disease) ([place])   . Diabetes mellitus without complication ([place])   . Gastric ulcer   . NSTEMI (non-ST elevated myocardial infarction) ([place])   . Upper GI bleed     PAST SURGICAL HISTORY:  Past Surgical History:  Procedure Laterality Date  . AMPUTATION Left 06/24/2018   Procedure: AMPUTATION GREAT TOE;  Surgeon: Samara Deist, DPM;  Location: [facility] ORS;  Service: Podiatry;  Laterality: Left;  . APPENDECTOMY    . CARDIAC CATHETERIZATION    . CHOLECYSTECTOMY    . COLONOSCOPY N/A 09/28/2017   Procedure: COLONOSCOPY;  Surgeon: Toledo, Benay Pike, MD;  Location: [facility] ENDOSCOPY;  Service: Gastroenterology;  Laterality:  N/A;  . ENTEROSCOPY Left 11/06/2017   Procedure: ENTEROSCOPY;  Surgeon: Jonathon Bellows, MD;  Location[facility] ENDOSCOPY;  Service: Gastroenterology;  Laterality: Left;  . ENTEROSCOPY N/A 06/22/2018   Procedure: ENTEROSCOPY;  Surgeon: Lin Landsman, MD;  Location: [facility] ENDOSCOPY;  Service: Gastroenterology;  Laterality: N/A;  . ESOPHAGOGASTRODUODENOSCOPY N/A 12/19/2016   Procedure: ESOPHAGOGASTRODUODENOSCOPY (EGD);  Surgeon: Lin Landsman, MD;  Location: [facility] ENDOSCOPY;  Service: Gastroenterology;  Laterality: N/A;  . ESOPHAGOGASTRODUODENOSCOPY N/A 09/28/2017   Procedure: ESOPHAGOGASTRODUODENOSCOPY (EGD);  Surgeon: Toledo, Benay Pike, MD;  Location: [facility] ENDOSCOPY;  Service: Gastroenterology;  Laterality: N/A;  . ESOPHAGOGASTRODUODENOSCOPY (EGD) WITH PROPOFOL N/A 08/08/2017   Procedure: ESOPHAGOGASTRODUODENOSCOPY (EGD) WITH PROPOFOL;  Surgeon: [name], MD;  Location: [facility] ENDOSCOPY;  Service: Endoscopy;  Laterality: N/A;  . [place]    . LEFT HEART CATH AND CORONARY ANGIOGRAPHY N/A 12/23/2016   Procedure: LEFT HEART CATH AND CORONARY ANGIOGRAPHY;  Surgeon: Teodoro Spray, MD;  Location: [place] CV LAB;  Service: Cardiovascular;  Laterality: N/A;  . LOWER EXTREMITY ANGIOGRAPHY Left 06/26/2018   Procedure: Lower Extremity Angiography;  Surgeon: Katha Cabal, MD;  Location: [place] CV LAB;  Service: Cardiovascular;  Laterality: Left;    SOCIAL HISTORY:  Social History   Tobacco Use  . Smoking status: Current Some Day Smoker    Packs/day: 0.25    Types: Cigarettes  . Smokeless tobacco: Never Used  Substance  Use Topics  . Alcohol use: No    FAMILY HISTORY:  Family History  Problem Relation Age of Onset  . Hypertension Mother   . Diabetes Mellitus II Mother   . Breast cancer Mother   . Heart disease Father   . Cancer Sister     DRUG ALLERGIES:  Allergies  Allergen Reactions  . Latex Anaphylaxis and Rash  . Gabapentin Nausea And Vomiting  and Other (See Comments)    REVIEW OF SYSTEMS:   CONSTITUTIONAL: No fever, positive fatigue positive weakness.  EYES: No blurred or double vision.  EARS, NOSE, AND THROAT: No tinnitus or ear pain.  RESPIRATORY: No cough, shortness of breath, wheezing or hemoptysis.  CARDIOVASCULAR: No chest pain, orthopnea, positive edema.  GASTROINTESTINAL: No nausea, vomiting, diarrhea or abdominal pain.  GENITOURINARY: No dysuria, hematuria.  ENDOCRINE: No polyuria, nocturia,  HEMATOLOGY: No anemia, easy bruising or bleeding SKIN: No rash or lesion. MUSCULOSKELETAL: No joint pain or arthritis.   NEUROLOGIC: No tingling, numbness, weakness.  PSYCHIATRY: No anxiety or depression.   MEDICATIONS AT HOME:  Prior to Admission medications   Medication Sig Start Date End Date Taking? Authorizing Provider  atorvastatin (LIPITOR) 40 MG tablet Take 1 tablet (40 mg total) by mouth daily. 01/16/18 06/27/18  Gladstone Lighter, MD  carvedilol (COREG) 3.125 MG tablet Take 1 tablet (3.125 mg total) by mouth 2 (two) times daily with a meal. 08/29/17   Dustin Flock, MD  clopidogrel (PLAVIX) 75 MG tablet Take 1 tablet (75 mg total) by mouth daily with breakfast. 06/28/18   Lang Snow, NP  ferrous sulfate 325 (65 FE) MG tablet Take 1 tablet (325 mg total) by mouth 2 (two) times daily with a meal. 09/30/17   Epifanio Lesches, MD  folic acid (FOLVITE) 1 MG tablet Take 1 tablet (1 mg total) by mouth daily. 09/30/17   Epifanio Lesches, MD  furosemide (LASIX) 40 MG tablet Take 1 tablet (40 mg total) by mouth daily. 01/16/18 01/16/19  Gladstone Lighter, MD  Insulin Glargine (LANTUS SOLOSTAR) 100 UNIT/ML Solostar Pen Inject 15 Units into the skin daily. Patient taking differently: Inject 16 Units into the skin daily.  08/29/17   Dustin Flock, MD  Multiple Vitamin (MULTIVITAMIN WITH MINERALS) TABS tablet Take 1 tablet by mouth daily. 06/28/18   Lang Snow, NP  omeprazole (PRILOSEC) 40 MG  capsule Take 1 capsule (40 mg total) by mouth 2 (two) times a day. 08/15/18 10/14/18  Lin Landsman, MD  ondansetron (ZOFRAN) 4 MG tablet Take 1 tablet (4 mg total) by mouth every 6 (six) hours as needed for nausea. 08/29/17   Dustin Flock, MD  oxyCODONE (OXY IR/ROXICODONE) 5 MG immediate release tablet Take 1-2 tablets (5-10 mg total) by mouth every 4 (four) hours as needed for moderate pain. Patient not taking: Reported on 07/06/2018 06/27/18   Lang Snow, NP  pregabalin (LYRICA) 100 MG capsule Take 1-2 capsules (100-200 mg total) by mouth See admin instructions. Take 1 capsule (100MG) by mouth every morning and 2 capsules (200MG) by mouth every night 01/16/18   Gladstone Lighter, MD  pregabalin (LYRICA) 150 MG capsule Take 150 mg by mouth 2 (two) times daily. 08/03/18   [provider]  PROAIR HFA 108 (90 Base) MCG/ACT inhaler Inhale 1-2 puffs into the lungs every 4 (four) hours as needed. 10/10/17   [provider]  SYMBICORT 160-4.5 MCG/ACT inhaler Inhale 2 puffs into the lungs 2 (two) times daily. 10/10/17   [provider]  thiamine  100 MG tablet Take 1 tablet (100 mg total) by mouth daily. 06/28/18   Lang Snow, NP      PHYSICAL EXAMINATION:   VITAL SIGNS: Blood pressure 133/72, pulse 97, temperature 98.9 F (37.2 C), temperature source Oral, resp. rate (!) 21, height 5' 3"  (1.6 m), weight 90.7 kg, SpO2 100 %.  GENERAL:  63 y.o.-year-old patient lying in the bed with no acute distress.  EYES: Pupils equal, round, reactive to light and accommodation. No scleral icterus. Extraocular muscles intact.  HEENT: Head atraumatic, normocephalic. Oropharynx and nasopharynx clear.  NECK:  Supple, no jugular venous distention. No thyroid enlargement, no tenderness.  LUNGS: Normal breath sounds bilaterally, no wheezing, rales,rhonchi or crepitation. No use of accessory muscles of respiration.  CARDIOVASCULAR: S1, S2 normal. No murmurs, rubs, or  gallops.  ABDOMEN: Soft, nontender, nondistended. Bowel sounds present. No organomegaly or mass.  EXTREMITIES: Positive pedal edema, cyanosis, or clubbing.  Left great toe lesion healing well NEUROLOGIC: Cranial nerves II through XII are intact. Muscle strength 5/5 in all extremities. Sensation intact. Gait not checked.  PSYCHIATRIC: The patient is alert and oriented x 3.  SKIN: No obvious rash, lesion, or ulcer.   LABORATORY PANEL:   CBC Recent Labs  Lab 08/17/18 1254  WBC 9.8  HGB 5.1*  HCT 18.7*  PLT 210  MCV 77.6*  MCH 21.2*  MCHC 27.3*  RDW 22.8*  LYMPHSABS 0.7  MONOABS 0.9  EOSABS 0.1  BASOSABS 0.0   ------------------------------------------------------------------------------------------------------------------  Chemistries  Recent Labs  Lab 08/17/18 1254  NA 137  K 4.0  CL 107  CO2 21*  GLUCOSE 169*  BUN 22  CREATININE 1.63*  CALCIUM 8.2*   ------------------------------------------------------------------------------------------------------------------ estimated creatinine clearance is 37.8 mL/min (A) (by C-G formula based on SCr of 1.63 mg/dL (H)). ------------------------------------------------------------------------------------------------------------------ No results for input(s): TSH, T4TOTAL, T3FREE, THYROIDAB in the last 72 hours.  Invalid input(s): FREET3   Coagulation profile Recent Labs  Lab 08/17/18 1254  INR 1.4*   ------------------------------------------------------------------------------------------------------------------- No results for input(s): DDIMER in the last 72 hours. -------------------------------------------------------------------------------------------------------------------  Cardiac Enzymes No results for input(s): CKMB, TROPONINI, MYOGLOBIN in the last 168 hours.  Invalid input(s): CK ------------------------------------------------------------------------------------------------------------------ Invalid  input(s): POCBNP  ---------------------------------------------------------------------------------------------------------------  Urinalysis    Component Value Date/Time   COLORURINE YELLOW (A) 08/26/2017 2053   APPEARANCEUR CLEAR (A) 08/26/2017 2053   APPEARANCEUR Clear 08/03/2013 2138   LABSPEC 1.018 08/26/2017 2053   LABSPEC 1.028 08/03/2013 2138   PHURINE 6.0 08/26/2017 2053   GLUCOSEU NEGATIVE 08/26/2017 2053   GLUCOSEU Negative 08/03/2013 2138   HGBUR NEGATIVE 08/26/2017 2053   BILIRUBINUR NEGATIVE 08/26/2017 2053   BILIRUBINUR Negative 08/03/2013 2138   [place] NEGATIVE 08/26/2017 2053   PROTEINUR 100 (A) 08/26/2017 2053   UROBILINOGEN 0.2 09/27/2006 2132   NITRITE NEGATIVE 08/26/2017 2053   LEUKOCYTESUR NEGATIVE 08/26/2017 2053   LEUKOCYTESUR Negative 08/03/2013 2138     RADIOLOGY: Dg Chest 2 View  Result Date: 08/17/2018 CLINICAL DATA:  Smoker, EMS with c/o of bilateral leg swelling that has increased since having left great toe amputated 2 weeks ago. Pt also has c/o of dry cough and generalized body achesPt has had numerous heart surgeries in the past EXAM: CHEST - 2 VIEW COMPARISON:  01/13/2018 FINDINGS: Moderate enlargement of the cardiopericardial silhouette. There are changes from prior cardiac surgery and aortic valve replacement. No mediastinal or hilar masses. No evidence of adenopathy. Mild prominence of the bronchovascular and interstitial markings, similar to prior studies. No convincing pulmonary edema. No evidence  of pneumonia. No pleural effusion or pneumothorax. Skeletal structures are demineralized but intact. IMPRESSION: 1. No acute cardiopulmonary disease. 2. Moderate cardiomegaly with stable changes from prior cardiac surgery. Electronically Signed   By: Lajean Manes M.D.   On: 08/17/2018 12:59   US Venous Img Lower Unilateral Left  Result Date: 08/17/2018 CLINICAL DATA:  Left lower extremity swelling. EXAM: LEFT LOWER EXTREMITY VENOUS DOPPLER  ULTRASOUND TECHNIQUE: Gray-scale sonography with graded compression, as well as color Doppler and duplex ultrasound were performed to evaluate the lower extremity deep venous systems from the level of the common femoral vein and including the common femoral, femoral, profunda femoral, popliteal and calf veins including the posterior tibial, peroneal and gastrocnemius veins when visible. The superficial great saphenous vein was also interrogated. Spectral Doppler was utilized to evaluate flow at rest and with distal augmentation maneuvers in the common femoral, femoral and popliteal veins. COMPARISON:  Ultrasound 01/13/2018. FINDINGS: Contralateral Common Femoral Vein: Respiratory phasicity is normal and symmetric with the symptomatic side. No evidence of thrombus. Normal compressibility. Common Femoral Vein: No evidence of thrombus. Normal compressibility, respiratory phasicity and response to augmentation. Saphenofemoral Junction: No evidence of thrombus. Normal compressibility and flow on color Doppler imaging. Profunda Femoral Vein: No evidence of thrombus. Normal compressibility and flow on color Doppler imaging. Femoral Vein: No evidence of thrombus. Normal compressibility, respiratory phasicity and response to augmentation. Popliteal Vein: No evidence of thrombus. Normal compressibility, respiratory phasicity and response to augmentation. Calf Veins: Limited evaluation. Where visualized normal color flow on color Doppler imaging. Other Findings: Prominent lymph nodes noted in the left groin. Exam was limited due to patient body habitus and lower extremity edema. IMPRESSION: 1.  No evidence of deep venous thrombosis. 2. Prominent lymph nodes in the groin. Exam was limited due to patient's body habitus and lower extremity edema. Electronically Signed   By: Marcello Moores  Register   On: 08/17/2018 12:51    EKG: Orders placed or performed during the hospital encounter of 08/17/18  . EKG 12-Lead  . EKG 12-Lead  .  EKG 12-Lead  . EKG 12-Lead  . EKG 12-Lead  . EKG 12-Lead    IMPRESSION AND PLAN: Patient 63 year old African-American female with history of chronic systolic CHF, COPD, diabetes who is presenting with generalized weakness  1.  Acute on chronic anemia patient is symptomatic with this.  We will transfuse 2 units of packed RBCs. Due to history of GI bleed will continue Protonix I have notified GI of admission may need endoscopy again   2.  Chronic systolic CHF currently compensated patient will be on some IV fluids for the next few hours monitor respiratory status   3.  Diabetes type 2 we will continue Lantus place on sliding scale insulin  4.  COPD without exacerbation continue inhalers  5.  Hyperlipidemia continue Lipitor  6.  SCDs for DVT prophylaxis    All the records are reviewed and case discussed with ED provider. Management plans discussed with the patient, family and they are in agreement.  CODE STATUS: Code Status History    Date Active Date Inactive Code Status Order ID Comments User Context   06/20/2018 2312 06/27/2018 2119 Full Code [id number]  Mayer Camel, NP ED   01/13/2018 1540 01/16/2018 1859 Full Code [id number]  Loletha Grayer, MD ED   11/03/2017 1758 11/06/2017 2136 Full Code [id number]  [name], MD Inpatient   09/26/2017 2353 09/30/2017 1404 Full Code [id number]  Lance Coon, MD Inpatient   08/27/2017 [phone] 08/29/2017  2054 Full Code [id number]  Harrie Foreman, MD Inpatient   08/07/2017 1704 08/09/2017 2237 Full Code [id number]  Saundra Shelling, MD Inpatient   12/18/2016 1146 12/26/2016 2032 Full Code [id number]  Vaughan Basta, MD Inpatient   12/18/2016 0959 12/18/2016 1146 Full Code [id number]  Vaughan Basta, MD ED   12/04/2015 0509 12/07/2015 1756 Full Code [id number]  Holley Raring, NP ED   10/28/2015 1640 10/29/2015 1711 Full Code [id number]  Bettey Costa, MD Inpatient   08/29/2015 1438 08/30/2015 1750 Full Code [id number]  Hugelmeyer,  Ubaldo Glassing, DO Inpatient   Advance Care Planning Activity    Advance Directive Documentation     Most Recent Value  Type of Advance Directive  Healthcare Power of Attorney, Living will  Pre-existing out of facility DNR order (yellow form or pink MOST form)  -  "MOST" Form in Place?  -       TOTAL TIME TAKING CARE OF THIS PATIENT: 45mnutes.    SDustin FlockM.D on 08/17/2018 at 3:19 PM  Between 7am to 6pm - Pager - [phone]  After 6pm go to [email or web address] - password [email or web address] [facility]  [phone] CC: Primary care physician; Patient, No Pcp Per

## 2018-08-18 ENCOUNTER — Inpatient Hospital Stay: Payer: Medicaid Other | Admitting: Anesthesiology

## 2018-08-18 ENCOUNTER — Inpatient Hospital Stay: Payer: Medicaid Other

## 2018-08-18 ENCOUNTER — Encounter
Admission: EM | Disposition: A | Discharge status: Home Health | Payer: Self-pay | Source: Home / Self Care | Attending: Internal Medicine

## 2018-08-18 HISTORY — PX: ENTEROSCOPY: SHX5533

## 2018-08-18 LAB — BASIC METABOLIC PANEL
Anion gap: 8 (ref 5–15)
BUN: 21 mg/dL (ref 8–23)
CO2: 24 mmol/L (ref 22–32)
Calcium: 8.3 mg/dL — ABNORMAL LOW (ref 8.9–10.3)
Chloride: 109 mmol/L (ref 98–111)
Creatinine, Ser: 1.71 mg/dL — ABNORMAL HIGH (ref 0.44–1.00)
GFR calc Af Amer: 36 mL/min — ABNORMAL LOW (ref >60)
GFR calc non Af Amer: 31 mL/min — ABNORMAL LOW (ref >60)
Glucose, Bld: 105 mg/dL — ABNORMAL HIGH (ref 70–99)
Potassium: 4.2 mmol/L (ref 3.5–5.1)
Sodium: 141 mmol/L (ref 135–145)

## 2018-08-18 LAB — FERRITIN: Ferritin: 17 ng/mL (ref 11–307)

## 2018-08-18 LAB — CBC
HCT: 26.1 % — ABNORMAL LOW (ref 36.0–46.0)
Hemoglobin: 7.5 g/dL — ABNORMAL LOW (ref 12.0–15.0)
MCH: 22.8 pg — ABNORMAL LOW (ref 26.0–34.0)
MCHC: 28.7 g/dL — ABNORMAL LOW (ref 30.0–36.0)
MCV: 79.3 fL — ABNORMAL LOW (ref 80.0–100.0)
Platelets: 200 10*3/uL (ref 150–400)
RBC: 3.29 MIL/uL — ABNORMAL LOW (ref 3.87–5.11)
RDW: 20.8 % — ABNORMAL HIGH (ref 11.5–15.5)
WBC: 8.6 10*3/uL (ref 4.0–10.5)
nRBC: 0 % (ref 0.0–0.2)

## 2018-08-18 LAB — GLUCOSE, CAPILLARY
Glucose-Capillary: 146 mg/dL — ABNORMAL HIGH (ref 70–99)
Glucose-Capillary: 92 mg/dL (ref 70–99)
Glucose-Capillary: 176 mg/dL — ABNORMAL HIGH (ref 70–99)
Glucose-Capillary: 118 mg/dL — ABNORMAL HIGH (ref 70–99)

## 2018-08-18 LAB — IRON AND TIBC
Iron: 72 ug/dL (ref 28–170)
Saturation Ratios: 25 % (ref 10.4–31.8)
TIBC: 286 ug/dL (ref 250–450)
UIBC: 214 ug/dL

## 2018-08-18 LAB — VITAMIN B12: Vitamin B-12: 445 pg/mL (ref 180–914)

## 2018-08-18 LAB — FOLATE: Folate: 19.2 ng/mL (ref >5.9)

## 2018-08-18 SURGERY — ENTEROSCOPY
Anesthesia: General

## 2018-08-18 SURGERY — ESOPHAGOGASTRODUODENOSCOPY (EGD) WITH PROPOFOL
Anesthesia: General

## 2018-08-18 MED ORDER — PROPOFOL 500 MG/50ML IV EMUL
INTRAVENOUS | Status: AC
  Filled 2018-08-18: qty 50

## 2018-08-18 MED ORDER — PROPOFOL 10 MG/ML IV BOLUS
INTRAVENOUS | Status: DC | PRN
  Administered 2018-08-18: 40 mg via INTRAVENOUS

## 2018-08-18 MED ORDER — SODIUM CHLORIDE 0.9 % IV SOLN: INTRAVENOUS | Status: DC

## 2018-08-18 MED ORDER — FUROSEMIDE 10 MG/ML IJ SOLN
40.0000 mg | Freq: Two times a day (BID) | INTRAMUSCULAR | Status: DC
  Administered 2018-08-18 – 2018-08-20 (×5): 40 mg via INTRAVENOUS
  Filled 2018-08-18 (×6): qty 4

## 2018-08-18 MED ORDER — LIDOCAINE HCL (CARDIAC) PF 100 MG/5ML IV SOSY
PREFILLED_SYRINGE | INTRAVENOUS | Status: DC | PRN
  Administered 2018-08-18: 150 mg via INTRAVENOUS

## 2018-08-18 MED ORDER — LIDOCAINE HCL (PF) 2 % IJ SOLN
INTRAMUSCULAR | Status: AC
  Filled 2018-08-18: qty 10

## 2018-08-18 MED ORDER — PHENYLEPHRINE HCL (PRESSORS) 10 MG/ML IV SOLN
INTRAVENOUS | Status: DC | PRN
  Administered 2018-08-18: 100 ug via INTRAVENOUS

## 2018-08-18 MED ORDER — PEG 3350-KCL-NA BICARB-NACL 420 G PO SOLR
4000.0000 mL | Freq: Once | ORAL | Status: DC
  Filled 2018-08-18: qty 4000

## 2018-08-18 MED ORDER — PROPOFOL 500 MG/50ML IV EMUL
INTRAVENOUS | Status: DC | PRN
  Administered 2018-08-18: 200 ug/kg/min via INTRAVENOUS

## 2018-08-18 MED ORDER — FUROSEMIDE 40 MG PO TABS
40.0000 mg | ORAL_TABLET | Freq: Two times a day (BID) | ORAL | Status: DC
  Filled 2018-08-18: qty 1

## 2018-08-18 MED ORDER — SODIUM CHLORIDE 0.9 % IV SOLN
INTRAVENOUS | Status: DC
  Administered 2018-08-18: 08:00:00 via INTRAVENOUS

## 2018-08-18 NOTE — Anesthesia Post-op Follow-up Note (Signed)
Anesthesia QCDR form completed.        

## 2018-08-18 NOTE — Anesthesia Postprocedure Evaluation (Signed)
Anesthesia Post Note  Patient: [name]  Procedure(s) Performed: ENTEROSCOPY (N/A )  Patient location during evaluation: PACU Anesthesia Type: General Level of consciousness: awake and alert Pain management: pain level controlled Vital Signs Assessment: post-procedure vital signs reviewed and stable Respiratory status: spontaneous breathing, nonlabored ventilation and respiratory function stable Cardiovascular status: blood pressure returned to baseline and stable Postop Assessment: no apparent nausea or vomiting Anesthetic complications: no               Durenda Hurt

## 2018-08-18 NOTE — Transfer of Care (Signed)
Immediate Anesthesia Transfer of Care Note  Patient: [name]  Procedure(s) Performed: ENTEROSCOPY (N/A )  Patient Location: PACU and Endoscopy Unit  Anesthesia Type:MAC  Level of Consciousness: sedated  Airway & Oxygen Therapy: Patient Spontanous Breathing and Patient connected to nasal cannula oxygen  Post-op Assessment: Report given to RN and Post -op Vital signs reviewed and stable  Post vital signs: Reviewed and stable  Last Vitals:  Vitals Value Taken Time  BP 93/54 08/18/18 0830  Temp    Pulse 84 08/18/18 0830  Resp    SpO2 100 % 08/18/18 0830    Last Pain:  Vitals:   08/18/18 0507  TempSrc: Oral  PainSc:          Complications: No apparent anesthesia complications

## 2018-08-18 NOTE — H&P (Signed)
Jonathon Bellows, MD [address] [place], [place], [place], [address] [address], [place], [place], [place], [address] Phone: [phone]  Fax: [phone]  Primary Care Physician:  Patient, No Pcp Per   Pre-Procedure History & Physical: HPI:  [name] is a 63 y.o. female is here for an eteroscopy   Past Medical History:  Diagnosis Date  . Cervical cancer ([place])   . CHF (congestive heart failure) ([place])   . Chronic anemia   . COPD (chronic obstructive pulmonary disease) ([place])   . Diabetes mellitus without complication ([place])   . Gastric ulcer   . NSTEMI (non-ST elevated myocardial infarction) ([place])   . Upper GI bleed     Past Surgical History:  Procedure Laterality Date  . AMPUTATION Left 06/24/2018   Procedure: AMPUTATION GREAT TOE;  Surgeon: Samara Deist, DPM;  Location: [facility] ORS;  Service: Podiatry;  Laterality: Left;  . APPENDECTOMY    . CARDIAC CATHETERIZATION    . CHOLECYSTECTOMY    . COLONOSCOPY N/A 09/28/2017   Procedure: COLONOSCOPY;  Surgeon: Toledo, Benay Pike, MD;  Location: [facility] ENDOSCOPY;  Service: Gastroenterology;  Laterality: N/A;  . ENTEROSCOPY Left 11/06/2017   Procedure: ENTEROSCOPY;  Surgeon: Jonathon Bellows, MD;  Location: [facility] ENDOSCOPY;  Service: Gastroenterology;  Laterality: Left;  . ENTEROSCOPY N/A 06/22/2018   Procedure: ENTEROSCOPY;  Surgeon: Lin Landsman, MD;  Location: [facility] ENDOSCOPY;  Service: Gastroenterology;  Laterality: N/A;  . ESOPHAGOGASTRODUODENOSCOPY N/A 12/19/2016   Procedure: ESOPHAGOGASTRODUODENOSCOPY (EGD);  Surgeon: Lin Landsman, MD;  Location: [facility] ENDOSCOPY;  Service: Gastroenterology;  Laterality: N/A;  . ESOPHAGOGASTRODUODENOSCOPY N/A 09/28/2017   Procedure: ESOPHAGOGASTRODUODENOSCOPY (EGD);  Surgeon: Toledo, Benay Pike, MD;  Location: [facility] ENDOSCOPY;  Service: Gastroenterology;  Laterality: N/A;  . ESOPHAGOGASTRODUODENOSCOPY (EGD) WITH PROPOFOL N/A 08/08/2017   Procedure: ESOPHAGOGASTRODUODENOSCOPY (EGD)  WITH PROPOFOL;  Surgeon: [name], MD;  Location: [facility] ENDOSCOPY;  Service: Endoscopy;  Laterality: N/A;  . [place]    . LEFT HEART CATH AND CORONARY ANGIOGRAPHY N/A 12/23/2016   Procedure: LEFT HEART CATH AND CORONARY ANGIOGRAPHY;  Surgeon: Teodoro Spray, MD;  Location: [place] CV LAB;  Service: Cardiovascular;  Laterality: N/A;  . LOWER EXTREMITY ANGIOGRAPHY Left 06/26/2018   Procedure: Lower Extremity Angiography;  Surgeon: Katha Cabal, MD;  Location: [place] CV LAB;  Service: Cardiovascular;  Laterality: Left;    Prior to Admission medications   Medication Sig Start Date End Date Taking? Authorizing Provider  clopidogrel (PLAVIX) 75 MG tablet Take 1 tablet (75 mg total) by mouth daily with breakfast. 06/28/18  Yes Lang Snow, NP  furosemide (LASIX) 40 MG tablet Take 1 tablet (40 mg total) by mouth daily. Patient taking differently: Take 40 mg by mouth 2 (two) times daily.  01/16/18 01/16/19 Yes Gladstone Lighter, MD  Insulin Glargine (LANTUS SOLOSTAR) 100 UNIT/ML Solostar Pen Inject 15 Units into the skin daily. Patient taking differently: Inject 16 Units into the skin at bedtime.  08/29/17  Yes Dustin Flock, MD  Multiple Vitamin (MULTIVITAMIN WITH MINERALS) TABS tablet Take 1 tablet by mouth daily. 06/28/18  Yes Lang Snow, NP  omeprazole (PRILOSEC) 40 MG capsule Take 1 capsule (40 mg total) by mouth 2 (two) times a day. 08/15/18 10/14/18 Yes Vanga, Tally Due, MD  pregabalin (LYRICA) 150 MG capsule Take 150 mg by mouth 2 (two) times daily. 08/03/18  Yes [provider]  PROAIR HFA 108 (90 Base) MCG/ACT inhaler Inhale 1-2 puffs into the lungs  every 4 (four) hours as needed. 10/10/17  Yes [provider]  SYMBICORT 160-4.5 MCG/ACT inhaler Inhale 2 puffs into the lungs 2 (two) times daily. 10/10/17  Yes [provider]  thiamine 100 MG tablet Take 1 tablet (100 mg total) by mouth daily. 06/28/18  Yes  Lang Snow, NP    Allergies as of 08/17/2018 - Review Complete 08/17/2018  Allergen Reaction Noted  . Latex Anaphylaxis and Rash 11/07/2012  . Gabapentin Nausea And Vomiting and Other (See Comments) 12/20/2012    Family History  Problem Relation Age of Onset  . Hypertension Mother   . Diabetes Mellitus II Mother   . Breast cancer Mother   . Heart disease Father   . Cancer Sister     Social History   Socioeconomic History  . Marital status: Married    Spouse name: Not on file  . Number of children: Not on file  . Years of education: Not on file  . Highest education level: Not on file  Occupational History  . Not on file  Social Needs  . Financial resource strain: Not on file  . Food insecurity    Worry: Not on file    Inability: Not on file  . Transportation needs    Medical: Not on file    Non-medical: Not on file  Tobacco Use  . Smoking status: Current Some Day Smoker    Packs/day: 0.25    Types: Cigarettes  . Smokeless tobacco: Never Used  Substance and Sexual Activity  . Alcohol use: No  . Drug use: No  . Sexual activity: Not on file  Lifestyle  . Physical activity    Days per week: Not on file    Minutes per session: Not on file  . Stress: Not on file  Relationships  . Social Herbalist on phone: Not on file    Gets together: Not on file    Attends religious service: Not on file    Active member of club or organization: Not on file    Attends meetings of clubs or organizations: Not on file    Relationship status: Not on file  . Intimate partner violence    Fear of current or ex partner: Not on file    Emotionally abused: Not on file    Physically abused: Not on file    Forced sexual activity: Not on file  Other Topics Concern  . Not on file  Social History Narrative  . Not on file    Review of Systems: See HPI, otherwise negative ROS  Physical Exam: BP 137/79 (BP Location: Right Arm)   Pulse 89   Temp 99.1 F (37.3  C) (Oral)   Resp 20   Ht 5' 3"  (1.6 m)   Wt 90.7 kg   SpO2 100%   BMI 35.43 kg/m  General:   Alert,  pleasant and cooperative in NAD Head:  Normocephalic and atraumatic. Neck:  Supple; no masses or thyromegaly. Lungs:  Clear throughout to auscultation, normal respiratory effort.    Heart:  +S1, +S2, Regular rate and rhythm, No edema. Abdomen:  Soft, nontender and nondistended. Normal bowel sounds, without guarding, and without rebound.   Neurologic:  Alert and  oriented x4;  grossly normal neurologically.  Impression/Plan: ELAINAH RHYNE is here for an enteroscopy to be performed for  evaluation of gi bleeding     Risks, benefits, limitations, and alternatives regarding endoscopy have been reviewed with the patient.  Questions  have been answered.  All parties agreeable.   Jonathon Bellows, MD  08/18/2018, 8:13 AM

## 2018-08-18 NOTE — Progress Notes (Addendum)
[place] at [place] NAME: [name]    MR#:  [id number]  DATE OF BIRTH:  Feb 15, 1955  SUBJECTIVE:  CHIEF COMPLAINT:   Chief Complaint  Patient presents with  . Leg Swelling  . Cough   No new complaint this morning.  Patient just came back from GI lab following upper endoscopy today.  Was still very sleepy.  No bleeding reported by nursing staff.  REVIEW OF SYSTEMS:  ROS Patient still sleepy due to effect of sedatives given during endoscopy this morning.  DRUG ALLERGIES:   Allergies  Allergen Reactions  . Latex Anaphylaxis and Rash  . Gabapentin Nausea And Vomiting and Other (See Comments)   VITALS:  Blood pressure 126/64, pulse 95, temperature 98.5 F (36.9 C), temperature source Oral, resp. rate 20, height 5' 3"  (1.6 m), weight 90.7 kg, SpO2 100 %. PHYSICAL EXAMINATION:  Physical Exam  GENERAL:  63 y.o.-year-old patient lying in the bed with no acute distress.  EYES: Pupils equal, round, reactive to light and accommodation. No scleral icterus. Extraocular muscles intact.  HEENT: Head atraumatic, normocephalic. Oropharynx and nasopharynx clear.  NECK:  Supple, no jugular venous distention. No thyroid enlargement, no tenderness.  LUNGS: Normal breath sounds bilaterally, no wheezing, rales,rhonchi or crepitation. No use of accessory muscles of respiration.  CARDIOVASCULAR: S1, S2 normal. No murmurs, rubs, or gallops.  ABDOMEN: Soft, nontender, nondistended. Bowel sounds present. No organomegaly or mass.  EXTREMITIES: Positive pedal edema, cyanosis, or clubbing.  Left great toe lesion healing well NEUROLOGIC: Full neuro exam not done.  Patient still somewhat sedated following upper endoscopy this morning.  Arousable but easily goes back to sleep.  PSYCHIATRIC: The patient still very sleepy from effect of sedatives this morning SKIN: No obvious rash, lesion, or ulcer.   LABORATORY PANEL:  Female CBC Recent Labs  Lab 08/18/18  0551  WBC 8.6  HGB 7.5*  HCT 26.1*  PLT 200   ------------------------------------------------------------------------------------------------------------------ Chemistries  Recent Labs  Lab 08/18/18 0551  NA 141  K 4.2  CL 109  CO2 24  GLUCOSE 105*  BUN 21  CREATININE 1.71*  CALCIUM 8.3*   RADIOLOGY:  No results found. ASSESSMENT AND PLAN:   Patient 63 year old African-American female with history of chronic systolic CHF, COPD, diabetes who is presenting with generalized weakness  1.  Acute on chronic anemia patient; patient was symptomatic.  Presented with hemoglobin of 5.1.  Transfused with 2 units of packed red blood cells with improvement in hemoglobin to 7.5.  Patient seen by gastroenterologist and had EGD done today which was unremarkable apart from erythematous mucosa in the greater curvature. Biopsied.  Plans for colonoscopy tomorrow.  Clear liquid diet today. Continue Protonix  2.  Chronic systolic CHF currently compensated Husband later reported patient has been noted to have some shortness of breath and increasing weight gain.  Discontinued gentle IV fluid hydration.  Increased frequency of Lasix to twice daily.  3.  Diabetes type 2 we will continue Lantus place on sliding scale insulin  4.  COPD without exacerbation continue inhalers  5.  Hyperlipidemia continue Lipitor  6.  SCDs for DVT prophylaxis  All the records are reviewed and case discussed with Care Management/Social Worker. Management plans discussed with the patient, and they are in agreement. Updated husband on treatment plans as outlined above.  All questions were answered.  CODE STATUS: Full Code  TOTAL TIME TAKING CARE OF THIS PATIENT: 37 minutes.   More than 50% of  the time was spent in counseling/coordination of care: YES  POSSIBLE D/C IN 2 DAYS, DEPENDING ON CLINICAL CONDITION.   [name] M.D on 08/18/2018 at 1:19 PM  Between 7am to 6pm - Pager - [phone]  After 6pm  go to [email or web address] - Proofreader  [facility]  Office  [phone]  CC: Primary care physician; Patient, No Pcp Per  Note: This dictation was prepared with Dragon dictation along with smaller phrase technology. Any transcriptional errors that result from this process are unintentional.

## 2018-08-18 NOTE — Progress Notes (Signed)
Informed Dr. Stark Jock re: patient very lethargic and has a red rash on right forearm. CT of the head ordered and to monitor the rash for any changes.

## 2018-08-18 NOTE — Anesthesia Preprocedure Evaluation (Addendum)
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  )   Cardiac Stress MRI 02/09/18:  1. The left ventricle is normal in cavity size. There is mild concentric LV hypertrophy. Global systolic function is low-normal with a calculated LV ejection fraction of 56%. There is mild hypokinesis of the basal-to-mid inferior wall. There is "septal bounce" of the interventricular septum, which could be consistent with prior cardiac surgery and/or RBBB.   2. The right ventricle is top-normal in cavity size with normal wall thickness. Global RV systolic function is low-normal to mildly reduced with an RVEF calculated at 48%.  3. Both atria are moderately enlarged.  4. There is a bioprosthetic valve in the aortic position. The leaflets are not well visualized due to metal artifacts. However, the valve appears to be well-seated and leaflets motion appear to be normal. There is mild aortic regurgitation.   5. There is mild thickening of the mitral valve leaflets. There is also calcification of the base of the posterior leaflet which is relatively immobile along with mitral annulus calcification (MAC). There is no significant mitral stenosis. There is moderate mitral regurgitation.  6. There is mild-to-moderate tricuspid regurgitation.  7. Delayed enhancement imaging for viability is abnormal. There is a small subendocardial  infarction (< 25% transmural) in the basal-to-mid inferior wall, which is consistent with the RCA perfusion territory. There is no MRI evidence of myocardial infiltration.  8. Adenosine stress perfusion imaging demonstrates no evidence of inducible myocardial ischemia.   9. There are bilateral moderate pleural effusions.  10. No intracardiac thrombus visualized.  11. Pericardial thickness is normal. There is no pericardial effusion. However, given the presence of the abovementioned abnormal "septal bounce" motion of the interventricular septum, constrictive physiology with normal pericardial thickness cannot be ruled out.   Neuro/Psych PSYCHIATRIC DISORDERS Anxiety Depression Polyneuropathy, radiculopathies    GI/Hepatic PUD, GERD  Controlled,(+) Hepatitis -, C  Endo/Other  diabetes  Renal/GU CRFnegative Renal ROS  negative genitourinary   Musculoskeletal   Abdominal   Peds  Hematology  (+) Blood dyscrasia, anemia , Chronic anemia, presented with severe anemia Hgb 5.1, Hgb currently 7.5 s/p PRBC. Denies CP, SOB, lightheadedness.  VSS   Anesthesia Other Findings Past Medical History: No date: Cervical cancer (HCC) No date: CHF (congestive heart failure) (HCC) No date: COPD (chronic obstructive pulmonary disease) (HCC) No date: Diabetes mellitus without complication (HCC) No date: NSTEMI (non-ST elevated myocardial infarction) ([place])  Past Surgical History: No date: APPENDECTOMY No date: CARDIAC CATHETERIZATION No date: CHOLECYSTECTOMY 09/28/2017: COLONOSCOPY; N/A     Comment:  Procedure: COLONOSCOPY;  Surgeon: Toledo, Benay Pike, MD;              Location: [facility] ENDOSCOPY;  Service: Gastroenterology;                Laterality: N/A; 11/06/2017: ENTEROSCOPY; Left     Comment:  Procedure: ENTEROSCOPY;  Surgeon: Jonathon Bellows, MD;                Location: [facility] ENDOSCOPY;  Service: Gastroenterology;                Laterality: Left; 12/19/2016: ESOPHAGOGASTRODUODENOSCOPY;  N/A     Comment:  Procedure: ESOPHAGOGASTRODUODENOSCOPY (EGD);  Surgeon:               Lin Landsman, MD;  Location: [facility] ENDOSCOPY;                Service: Gastroenterology;  Laterality: N/A; 09/28/2017: ESOPHAGOGASTRODUODENOSCOPY; N/A     Comment:  Procedure: ESOPHAGOGASTRODUODENOSCOPY (EGD);  Surgeon:               Toledo, Benay Pike, MD;  Location: [facility] ENDOSCOPY;                Service: Gastroenterology;  Laterality: N/A; 08/08/2017: ESOPHAGOGASTRODUODENOSCOPY (EGD) WITH PROPOFOL; N/A     Comment:  Procedure: ESOPHAGOGASTRODUODENOSCOPY (EGD) WITH               PROPOFOL;  Surgeon: [name], MD;  Location: [facility]               ENDOSCOPY;  Service: Endoscopy;  Laterality: N/A; No date: heart surgery in [place] 12/23/2016: LEFT HEART CATH AND CORONARY ANGIOGRAPHY; N/A     Comment:  Procedure: LEFT HEART CATH AND CORONARY ANGIOGRAPHY;                Surgeon: Teodoro Spray, MD;  Location: [facility] CV              LAB;  Service: Cardiovascular;  Laterality: N/A;  BMI    Body Mass Index:  27.46 kg/m      Reproductive/Obstetrics negative OB ROS                            Anesthesia Physical  Anesthesia Plan  ASA: III  Anesthesia Plan: General   Post-op Pain Management:    Induction:   PONV Risk Score and Plan: Propofol infusion and TIVA  Airway Management Planned: Nasal Cannula and Natural Airway  Additional Equipment:   Intra-op Plan:   Post-operative Plan:   Informed Consent: I have reviewed the patients History and Physical, chart, labs and discussed the procedure including the risks, benefits and alternatives for the proposed anesthesia with the patient or authorized representative who has indicated his/her understanding and acceptance.     Dental Advisory Given  Plan Discussed with: Anesthesiologist and CRNA  Anesthesia Plan Comments:         Anesthesia Quick Evaluation

## 2018-08-18 NOTE — Op Note (Signed)
[facility] Gastroenterology Patient Name: [name] Procedure Date: 08/18/2018 7:27 AM MRN: [id number] Account #: [id number] Date of Birth: 07-19-1955 Admit Type: Inpatient Age: 63 Room: [facility] ENDO ROOM 4 Gender: Female Note Status: Finalized Procedure:            Small bowel enteroscopy Indications:          Iron deficiency anemia Providers:            Jonathon Bellows MD, MD Medicines:            Monitored Anesthesia Care Complications:        No immediate complications. Procedure:            Pre-Anesthesia Assessment:                       - Prior to the procedure, a History and Physical was                        performed, and patient medications, allergies and                        sensitivities were reviewed. The patient's tolerance of                        previous anesthesia was reviewed.                       - ASA Grade Assessment: III - A patient with severe                        systemic disease.                       After obtaining informed consent, the endoscope was                        passed under direct vision. Throughout the procedure,                        the patient's blood pressure, pulse, and oxygen                        saturations were monitored continuously. The was                        introduced through the mouth and advanced to the                        proximal jejunum. The small bowel enteroscopy was                        accomplished with ease. The patient tolerated the                        procedure well. Findings:      The esophagus was normal.      There was no evidence of significant pathology in the entire examined       portion of jejunum.      Localized moderately erythematous mucosa without bleeding was found on       the greater curvature of the stomach. Biopsies were taken with a cold  forceps for histology. Impression:           - Normal esophagus.                       - The examined portion of the  jejunum was normal.                       - Erythematous mucosa in the greater curvature.                        Biopsied. Recommendation:       - Return patient to hospital ward for ongoing care.                       - Perform a colonoscopy tomorrow.                       - Clear liquid diet today. Procedure Code(s):    --- Professional ---                       [phone], Small intestinal endoscopy, enteroscopy beyond                        second portion of duodenum, not including ileum; with                        biopsy, single or multiple Diagnosis Code(s):    --- Professional ---                       K31.89, Other diseases of stomach and duodenum                       D50.9, Iron deficiency anemia, unspecified CPT copyright 2019 American Medical Association. All rights reserved. The codes documented in this report are preliminary and upon coder review may  be revised to meet current compliance requirements. Jonathon Bellows, MD Jonathon Bellows MD, MD 08/18/2018 8:28:26 AM This report has been signed electronically. Number of Addenda: 0 Note Initiated On: 08/18/2018 7:27 AM Estimated Blood Loss: Estimated blood loss: none.      [facility]

## 2018-08-19 ENCOUNTER — Encounter: Payer: Self-pay | Admitting: Certified Registered"

## 2018-08-19 ENCOUNTER — Encounter
Admission: EM | Disposition: A | Discharge status: Home Health | Payer: Self-pay | Source: Home / Self Care | Attending: Internal Medicine

## 2018-08-19 LAB — URINALYSIS, ROUTINE W REFLEX MICROSCOPIC
Bilirubin Urine: NEGATIVE
Glucose, UA: NEGATIVE mg/dL
Ketones, ur: NEGATIVE mg/dL
Leukocytes,Ua: NEGATIVE
Nitrite: NEGATIVE
Protein, ur: NEGATIVE mg/dL
Specific Gravity, Urine: 1.009 (ref 1.005–1.030)
pH: 6 (ref 5.0–8.0)

## 2018-08-19 LAB — BASIC METABOLIC PANEL
Anion gap: 7 (ref 5–15)
BUN: 22 mg/dL (ref 8–23)
CO2: 24 mmol/L (ref 22–32)
Calcium: 8.4 mg/dL — ABNORMAL LOW (ref 8.9–10.3)
Chloride: 108 mmol/L (ref 98–111)
Creatinine, Ser: 1.81 mg/dL — ABNORMAL HIGH (ref 0.44–1.00)
GFR calc Af Amer: 34 mL/min — ABNORMAL LOW (ref >60)
GFR calc non Af Amer: 29 mL/min — ABNORMAL LOW (ref >60)
Glucose, Bld: 98 mg/dL (ref 70–99)
Potassium: 4.5 mmol/L (ref 3.5–5.1)
Sodium: 139 mmol/L (ref 135–145)

## 2018-08-19 LAB — CBC
HCT: 25.1 % — ABNORMAL LOW (ref 36.0–46.0)
Hemoglobin: 7.4 g/dL — ABNORMAL LOW (ref 12.0–15.0)
MCH: 23.3 pg — ABNORMAL LOW (ref 26.0–34.0)
MCHC: 29.5 g/dL — ABNORMAL LOW (ref 30.0–36.0)
MCV: 79.2 fL — ABNORMAL LOW (ref 80.0–100.0)
Platelets: 204 10*3/uL (ref 150–400)
RBC: 3.17 MIL/uL — ABNORMAL LOW (ref 3.87–5.11)
RDW: 21.8 % — ABNORMAL HIGH (ref 11.5–15.5)
WBC: 8.3 10*3/uL (ref 4.0–10.5)
nRBC: 0 % (ref 0.0–0.2)

## 2018-08-19 LAB — BPAM RBC
Blood Product Expiration Date: 202008132359
Blood Product Expiration Date: 202008132359
ISSUE DATE / TIME: 202007311827
ISSUE DATE / TIME: 202008010048
Unit Type and Rh: 5100
Unit Type and Rh: 5100

## 2018-08-19 LAB — TYPE AND SCREEN
ABO/RH(D): O POS
Antibody Screen: NEGATIVE
Unit division: 0
Unit division: 0

## 2018-08-19 LAB — GLUCOSE, CAPILLARY
Glucose-Capillary: 99 mg/dL (ref 70–99)
Glucose-Capillary: 94 mg/dL (ref 70–99)
Glucose-Capillary: 108 mg/dL — ABNORMAL HIGH (ref 70–99)
Glucose-Capillary: 171 mg/dL — ABNORMAL HIGH (ref 70–99)

## 2018-08-19 LAB — MAGNESIUM: Magnesium: 1.9 mg/dL (ref 1.7–2.4)

## 2018-08-19 SURGERY — COLONOSCOPY WITH PROPOFOL
Anesthesia: General

## 2018-08-19 MED ORDER — PEG 3350-KCL-NA BICARB-NACL 420 G PO SOLR
4000.0000 mL | Freq: Once | ORAL | Status: AC
  Administered 2018-08-19: 4000 mL via ORAL
  Filled 2018-08-19: qty 4000

## 2018-08-19 MED ORDER — MORPHINE SULFATE (PF) 2 MG/ML IV SOLN
2.0000 mg | INTRAVENOUS | Status: DC | PRN
  Administered 2018-08-19: 2 mg via INTRAVENOUS
  Filled 2018-08-19: qty 1

## 2018-08-19 MED ORDER — SODIUM CHLORIDE 0.9 % IV SOLN
INTRAVENOUS | Status: DC
  Administered 2018-08-20 – 2018-08-21 (×2): via INTRAVENOUS

## 2018-08-19 MED ORDER — MORPHINE SULFATE (PF) 2 MG/ML IV SOLN: 1.0000 mg | INTRAVENOUS | Status: DC | PRN

## 2018-08-19 NOTE — Progress Notes (Signed)
Informed Dr. Vicente Males patient unable to finish bowel prep as ordered.

## 2018-08-19 NOTE — Progress Notes (Signed)
[place] at [place] NAME: Misao Fackrell    MR#:  [id number]  DATE OF BIRTH:  03-14-1955  SUBJECTIVE:  CHIEF COMPLAINT:   Chief Complaint  Patient presents with  . Leg Swelling  . Cough   Patient was very lethargic following EGD done yesterday.  CT of the head without contrast was done yesterday evening which came back negative.  This morning patient more awake and alert and oriented.  No further bleeding.  Bowel prep could not be done yesterday due to patient being very lethargic.  REVIEW OF SYSTEMS:  Review of Systems  Constitutional: Negative for chills and fever.  HENT: Negative for hearing loss and tinnitus.   Eyes: Negative for blurred vision and double vision.  Respiratory: Negative for cough and hemoptysis.   Cardiovascular: Negative for chest pain and palpitations.  Gastrointestinal: Negative for heartburn and nausea.  Genitourinary: Negative for dysuria and urgency.  Musculoskeletal: Negative for joint pain and myalgias.  Skin: Negative for rash.  Neurological: Negative for dizziness and headaches.  Psychiatric/Behavioral: Negative for depression and hallucinations.     DRUG ALLERGIES:   Allergies  Allergen Reactions  . Latex Anaphylaxis and Rash  . Gabapentin Nausea And Vomiting and Other (See Comments)   VITALS:  Blood pressure (!) 145/78, pulse 88, temperature 98.5 F (36.9 C), temperature source Oral, resp. rate 20, height 5' 3"  (1.6 m), weight 90.7 kg, SpO2 99 %. PHYSICAL EXAMINATION:  Physical Exam  GENERAL:  63 y.o.-year-old patient lying in the bed with no acute distress.-year-old patient lying in the bed with no acute distress.  EYES: Pupils equal, round, reactive to light and accommodation. No scleral icterus. Extraocular muscles intact.  HEENT: Head atraumatic, normocephalic. Oropharynx and nasopharynx clear.  NECK:  Supple, no jugular venous distention. No thyroid enlargement, no tenderness.  LUNGS: Normal breath sounds bilaterally, no wheezing,  rales,rhonchi or crepitation. No use of accessory muscles of respiration.  CARDIOVASCULAR: S1, S2 normal. No murmurs, rubs, or gallops.  ABDOMEN: Soft, nontender, nondistended. Bowel sounds present. No organomegaly or mass.  EXTREMITIES: Positive pedal edema, cyanosis, or clubbing.  Left great toe lesion healing well NEUROLOGIC: Awake and alert and oriented.  Moving all extremities with no deficit PSYCHIATRIC: Patient awake and alert and oriented x3  SKIN: No obvious rash, lesion, or ulcer.   LABORATORY PANEL:  Female CBC Recent Labs  Lab 08/19/18 0629  WBC 8.3  HGB 7.4*  HCT 25.1*  PLT 204   ------------------------------------------------------------------------------------------------------------------ Chemistries  Recent Labs  Lab 08/19/18 0629  NA 139  K 4.5  CL 108  CO2 24  GLUCOSE 98  BUN 22  CREATININE 1.81*  CALCIUM 8.4*  MG 1.9   RADIOLOGY:  Ct Head Wo Contrast  Result Date: 08/18/2018 CLINICAL DATA:  Increased lethargy today.  Confused. EXAM: CT HEAD WITHOUT CONTRAST TECHNIQUE: Contiguous axial images were obtained from the base of the skull through the vertex without intravenous contrast. COMPARISON:  12/18/2016 FINDINGS: Brain: No evidence of acute infarction, hemorrhage, hydrocephalus, extra-axial collection or mass lesion/mass effect. Patchy areas of white matter hypoattenuation are noted consistent with mild to moderate chronic microvascular ischemic change. Old lacunar infarct near the genu of the left internal capsule. Old white matter lacunar infarct in the right parietal lobe. Vascular: No hyperdense vessel or unexpected calcification. Skull: Normal. Negative for fracture or focal lesion. Sinuses/Orbits: Globes and orbits are unremarkable. Visualized sinuses and mastoid air cells are essentially clear. Other: None. IMPRESSION: 1. No acute intracranial abnormalities. 2. Mild to moderate chronic microvascular ischemic change.  Small old lacunar infarcts.  Electronically Signed   By: Lajean Manes M.D.   On: 08/18/2018 18:19   ASSESSMENT AND PLAN:   Patient 63 year old African-American female with history of chronic systolic CHF, COPD, diabetes who is presenting with generalized weakness  1.  Acute on chronic anemia patient; patient was symptomatic.  Presented with hemoglobin of 5.1.  Transfused with 2 units of packed red blood cells and hemoglobin remained stable at 7.4 today.   Patient seen by gastroenterologist and had EGD done on 08/18/2018 which was unremarkable apart from erythematous mucosa in the greater curvature. Biopsied. Continue Protonix Patient was very lethargic yesterday and could not have a bowel prep for planned colonoscopy.  Colonoscopy rescheduled for tomorrow.  If negative plan is for repeat capsule study of the small bowel.  2.  Chronic systolic CHF currently compensated Husband later reported patient has been noted to have some shortness of breath and increasing weight gain.  Discontinued gentle IV fluid hydration previously and increased frequency of Lasix to twice daily.  This was later changed to IV since patient was very lethargic to take p.o. meds previously.  3.  Diabetes type 2 we will continue Lantus place on sliding scale insulin  4.  COPD without exacerbation continue inhalers  5.  Hyperlipidemia continue Lipitor  6.    Mild rash on the right hand At site of prior IV.  Not sure if this was due to reaction to meds given during recent EGD. No evidence of any airway compromise or systemic reaction. Monitor clinically for continued improvement  SCDs for DVT prophylaxis  All the records are reviewed and case discussed with Care Management/Social Worker. Management plans discussed with the patient, and they are in agreement. Updated husband today on treatment plans as outlined above.  All questions were answered.  CODE STATUS: Full Code  TOTAL TIME TAKING CARE OF THIS PATIENT: 34 minutes.   More than  50% of the time was spent in counseling/coordination of care: YES  POSSIBLE D/C IN 2 DAYS, DEPENDING ON CLINICAL CONDITION.   Cammie Faulstich M.D on 08/19/2018 at 12:59 PM  Between 7am to 6pm - Pager - [phone]  After 6pm go to [email or web address] - Proofreader  [facility] [place] [facility]  Office  [phone]  CC: Primary care physician; Patient, No Pcp Per  Note: This dictation was prepared with Dragon dictation along with smaller phrase technology. Any transcriptional errors that result from this process are unintentional.

## 2018-08-19 NOTE — Progress Notes (Signed)
Patient had remained lethargic from 1900-0415, unable to administer oral medicaitons as ordered.  Patient reporting unresolved pain requesting meds.  Requested pain medication from hospitalist. See new orders.

## 2018-08-19 NOTE — Progress Notes (Signed)
[name] , MD [address], [place], [place], [place], [address] [address] [place], [place], [place], [address] Phone: [phone]  Fax: [phone]   [name] is being followed for iron deficiency anemia day 2 of follow up   Subjective: Feels ok , was confused yesterday and didn't do her bowel prep    Objective: Vital signs in last 24 hours: Vitals:   08/18/18 1346 08/18/18 1719 08/18/18 1934 08/19/18 0413  BP: 127/65 138/64 131/63 (!) 145/78  Pulse: 89 85 83 88  Resp: 16 20 18 20   Temp:  97.7 F (36.5 C) 98.2 F (36.8 C) 98.5 F (36.9 C)  TempSrc:  Oral Oral Oral  SpO2: 100% 100% 100% 99%  Weight:      Height:       Weight change:   Intake/Output Summary (Last 24 hours) at 08/19/2018 1014 Last data filed at 08/19/2018 0453 Gross per 24 hour  Intake 849.79 ml  Output 600 ml  Net 249.79 ml     Exam: Heart:: Regular rate and rhythm, S1S2 present or without murmur or extra heart sounds Lungs: normal, clear to auscultation and clear to auscultation and percussion Abdomen: soft, nontender, normal bowel sounds   Lab Results: @LABTEST2 @ Micro Results: Recent Results (from the past 240 hour(s))  SARS Coronavirus 2 [facility] order, Performed in [facility] hospital lab) Nasopharyngeal Nasopharyngeal Swab     Status: None   Collection Time: 08/17/18  3:04 PM   Specimen: Nasopharyngeal Swab  Result Value Ref Range Status   SARS Coronavirus 2 NEGATIVE NEGATIVE Final    Comment: (NOTE) If result is NEGATIVE SARS-CoV-2 target nucleic acids are NOT DETECTED. The SARS-CoV-2 RNA is generally detectable in upper and lower  respiratory specimens during the acute phase of infection. The lowest  concentration of SARS-CoV-2 viral copies this assay can detect is 250  copies / mL. A negative result does not preclude SARS-CoV-2 infection  and should not be used as the sole basis for treatment or other  patient management decisions.  A negative result may  occur with  improper specimen collection / handling, submission of specimen other  than nasopharyngeal swab, presence of viral mutation(s) within the  areas targeted by this assay, and inadequate number of viral copies  (<250 copies / mL). A negative result must be combined with clinical  observations, patient history, and epidemiological information. If result is POSITIVE SARS-CoV-2 target nucleic acids are DETECTED. The SARS-CoV-2 RNA is generally detectable in upper and lower  respiratory specimens dur ing the acute phase of infection.  Positive  results are indicative of active infection with SARS-CoV-2.  Clinical  correlation with patient history and other diagnostic information is  necessary to determine patient infection status.  Positive results do  not rule out bacterial infection or co-infection with other viruses. If result is PRESUMPTIVE POSTIVE SARS-CoV-2 nucleic acids MAY BE PRESENT.   A presumptive positive result was obtained on the submitted specimen  and confirmed on repeat testing.  While 2019 novel coronavirus  (SARS-CoV-2) nucleic acids may be present in the submitted sample  additional confirmatory testing may be necessary for epidemiological  and / or clinical management purposes  to differentiate between  SARS-CoV-2 and other Sarbecovirus currently known to infect humans.  If clinically indicated additional testing with an alternate test  methodology [id number]) is advised. The SARS-CoV-2 RNA is generally  detectable in upper and lower respiratory sp ecimens during the acute  phase of infection. The expected result  is Negative. Fact Sheet for Patients:  StrictlyIdeas.no Fact Sheet for Healthcare Providers: BankingDealers.co.za This test is not yet approved or cleared by the [place] FDA and has been authorized for detection and/or diagnosis of SARS-CoV-2 by FDA under an Emergency Use Authorization (EUA).  This  EUA will remain in effect (meaning this test can be used) for the duration of the COVID-19 declaration under Section 564(b)(1) of the Act, 21 U.S.C. section 360bbb-3(b)(1), unless the authorization is terminated or revoked sooner. Performed at [facility], [address] [place], [place] [address]    Studies/Results: Dg Chest 2 View  Result Date: 08/17/2018 CLINICAL DATA:  Smoker, EMS with c/o of bilateral leg swelling that has increased since having left great toe amputated 2 weeks ago. Pt also has c/o of dry cough and generalized body achesPt has had numerous heart surgeries in the past EXAM: CHEST - 2 VIEW COMPARISON:  01/13/2018 FINDINGS: Moderate enlargement of the cardiopericardial silhouette. There are changes from prior cardiac surgery and aortic valve replacement. No mediastinal or hilar masses. No evidence of adenopathy. Mild prominence of the bronchovascular and interstitial markings, similar to prior studies. No convincing pulmonary edema. No evidence of pneumonia. No pleural effusion or pneumothorax. Skeletal structures are demineralized but intact. IMPRESSION: 1. No acute cardiopulmonary disease. 2. Moderate cardiomegaly with stable changes from prior cardiac surgery. Electronically Signed   By: Lajean Manes M.D.   On: 08/17/2018 12:59   Ct Head Wo Contrast  Result Date: 08/18/2018 CLINICAL DATA:  Increased lethargy today.  Confused. EXAM: CT HEAD WITHOUT CONTRAST TECHNIQUE: Contiguous axial images were obtained from the base of the skull through the vertex without intravenous contrast. COMPARISON:  12/18/2016 FINDINGS: Brain: No evidence of acute infarction, hemorrhage, hydrocephalus, extra-axial collection or mass lesion/mass effect. Patchy areas of white matter hypoattenuation are noted consistent with mild to moderate chronic microvascular ischemic change. Old lacunar infarct near the genu of the left internal capsule. Old white matter lacunar infarct in the right  parietal lobe. Vascular: No hyperdense vessel or unexpected calcification. Skull: Normal. Negative for fracture or focal lesion. Sinuses/Orbits: Globes and orbits are unremarkable. Visualized sinuses and mastoid air cells are essentially clear. Other: None. IMPRESSION: 1. No acute intracranial abnormalities. 2. Mild to moderate chronic microvascular ischemic change. Small old lacunar infarcts. Electronically Signed   By: Lajean Manes M.D.   On: 08/18/2018 18:19   US Venous Img Lower Unilateral Left  Result Date: 08/17/2018 CLINICAL DATA:  Left lower extremity swelling. EXAM: LEFT LOWER EXTREMITY VENOUS DOPPLER ULTRASOUND TECHNIQUE: Gray-scale sonography with graded compression, as well as color Doppler and duplex ultrasound were performed to evaluate the lower extremity deep venous systems from the level of the common femoral vein and including the common femoral, femoral, profunda femoral, popliteal and calf veins including the posterior tibial, peroneal and gastrocnemius veins when visible. The superficial great saphenous vein was also interrogated. Spectral Doppler was utilized to evaluate flow at rest and with distal augmentation maneuvers in the common femoral, femoral and popliteal veins. COMPARISON:  Ultrasound 01/13/2018. FINDINGS: Contralateral Common Femoral Vein: Respiratory phasicity is normal and symmetric with the symptomatic side. No evidence of thrombus. Normal compressibility. Common Femoral Vein: No evidence of thrombus. Normal compressibility, respiratory phasicity and response to augmentation. Saphenofemoral Junction: No evidence of thrombus. Normal compressibility and flow on color Doppler imaging. Profunda Femoral Vein: No evidence of thrombus. Normal compressibility and flow on color Doppler imaging. Femoral Vein: No evidence of thrombus. Normal compressibility, respiratory phasicity and response to  augmentation. Popliteal Vein: No evidence of thrombus. Normal compressibility, respiratory  phasicity and response to augmentation. Calf Veins: Limited evaluation. Where visualized normal color flow on color Doppler imaging. Other Findings: Prominent lymph nodes noted in the left groin. Exam was limited due to patient body habitus and lower extremity edema. IMPRESSION: 1.  No evidence of deep venous thrombosis. 2. Prominent lymph nodes in the groin. Exam was limited due to patient's body habitus and lower extremity edema. Electronically Signed   By: Marcello Moores  Register   On: 08/17/2018 12:51   Medications: I have reviewed the patient's current medications. Scheduled Meds:  atorvastatin  40 mg Oral Daily   carvedilol  3.125 mg Oral BID WC   ferrous sulfate  325 mg Oral BID WC   folic acid  1 mg Oral Daily   furosemide  40 mg Intravenous BID   insulin aspart  0-5 Units Subcutaneous QHS   insulin aspart  0-9 Units Subcutaneous TID WC   insulin glargine  16 Units Subcutaneous QHS   mometasone-formoterol  2 puff Inhalation BID   [START ON 08/21/2018] pantoprazole  40 mg Intravenous Q12H   polyethylene glycol-electrolytes  4,000 mL Oral Once   pregabalin  150 mg Oral BID   thiamine  100 mg Oral Daily   Continuous Infusions:  sodium chloride 20 mL/hr at 08/18/18 1509   pantoprozole (PROTONIX) infusion 8 mg/hr (08/19/18 0400)   PRN Meds:.acetaminophen **OR** acetaminophen, albuterol, morphine injection, morphine injection, ondansetron **OR** ondansetron (ZOFRAN) IV   Assessment: Active Problems:   GI bleed  She has been admitted with iron deficiency anemia.  Prior evaluation in 2019 for similar reasons demonstrated gastric and small bowel AVMs which in the proximal aspect were ablated with APC.  I repeated her push enteroscopy yesterday and it was negative.  Iron studies were not ordered till yesterday.  Although I placed the iron studies as an add-on I suspect it was repeated as a new blood draw as the iron studies are completely normal whereas 1 month back she had a  ferritin of 9.  Plan:  1.  She was planned for colonoscopy today but did not do her bowel prep and hence will be planned for tomorrow.  If negative I would suggest we repeat her capsule study of the small bowel as she may have developed AVMs of the mid and distal small bowel.  2.  Receive IV iron if already not received during the admission and should require hematology follow-up as an outpatient for IV iron she sees Dr. Mike Gip as an outpatient.  I have discussed alternative options, risks & benefits,  which include, but are not limited to, bleeding, infection, perforation,respiratory complication & drug reaction.  The patient agrees with this plan & written consent will be obtained.      LOS: 2 days   Tanya Bellows, MD 08/19/2018, 10:14 AM

## 2018-08-20 ENCOUNTER — Encounter: Payer: Self-pay | Admitting: Gastroenterology

## 2018-08-20 LAB — CBC
HCT: 24.6 % — ABNORMAL LOW (ref 36.0–46.0)
Hemoglobin: 7 g/dL — ABNORMAL LOW (ref 12.0–15.0)
MCH: 22.7 pg — ABNORMAL LOW (ref 26.0–34.0)
MCHC: 28.5 g/dL — ABNORMAL LOW (ref 30.0–36.0)
MCV: 79.6 fL — ABNORMAL LOW (ref 80.0–100.0)
Platelets: 194 10*3/uL (ref 150–400)
RBC: 3.09 MIL/uL — ABNORMAL LOW (ref 3.87–5.11)
RDW: 22.4 % — ABNORMAL HIGH (ref 11.5–15.5)
WBC: 6.6 10*3/uL (ref 4.0–10.5)
nRBC: 0 % (ref 0.0–0.2)

## 2018-08-20 LAB — GLUCOSE, CAPILLARY
Glucose-Capillary: 95 mg/dL (ref 70–99)
Glucose-Capillary: 87 mg/dL (ref 70–99)
Glucose-Capillary: 138 mg/dL — ABNORMAL HIGH (ref 70–99)
Glucose-Capillary: 160 mg/dL — ABNORMAL HIGH (ref 70–99)

## 2018-08-20 LAB — BASIC METABOLIC PANEL
Anion gap: 7 (ref 5–15)
BUN: 21 mg/dL (ref 8–23)
CO2: 23 mmol/L (ref 22–32)
Calcium: 8 mg/dL — ABNORMAL LOW (ref 8.9–10.3)
Chloride: 106 mmol/L (ref 98–111)
Creatinine, Ser: 1.67 mg/dL — ABNORMAL HIGH (ref 0.44–1.00)
GFR calc Af Amer: 37 mL/min — ABNORMAL LOW (ref >60)
GFR calc non Af Amer: 32 mL/min — ABNORMAL LOW (ref >60)
Glucose, Bld: 97 mg/dL (ref 70–99)
Potassium: 4.1 mmol/L (ref 3.5–5.1)
Sodium: 136 mmol/L (ref 135–145)

## 2018-08-20 LAB — MAGNESIUM: Magnesium: 1.8 mg/dL (ref 1.7–2.4)

## 2018-08-20 MED ORDER — SODIUM CHLORIDE 0.9 % IV SOLN
510.0000 mg | Freq: Once | INTRAVENOUS | Status: AC
  Administered 2018-08-20: 510 mg via INTRAVENOUS
  Filled 2018-08-20: qty 17

## 2018-08-20 MED ORDER — MAGNESIUM CITRATE PO SOLN
1.0000 | Freq: Once | ORAL | Status: AC
  Administered 2018-08-20: 1 via ORAL
  Filled 2018-08-20: qty 296

## 2018-08-20 MED ORDER — POLYETHYLENE GLYCOL 3350 17 GM/SCOOP PO POWD
1.0000 | Freq: Once | ORAL | Status: AC
  Administered 2018-08-20: 255 g via ORAL
  Filled 2018-08-20: qty 255

## 2018-08-20 NOTE — Progress Notes (Signed)
[name] at [place] NAME: [name]    MR#:  [id number]  DATE OF BIRTH:  [birth year]  SUBJECTIVE:  CHIEF COMPLAINT:   Chief Complaint  Patient presents with  . Leg Swelling  . Cough   Patient was not able to do her bowel prep for colonoscopy due to being very sleepy yesterday.  Patient awake and alert this morning.  Oriented.  No fevers    REVIEW OF SYSTEMS:  Review of Systems  Constitutional: Negative for chills and fever.  HENT: Negative for hearing loss and tinnitus.   Eyes: Negative for blurred vision and double vision.  Respiratory: Negative for cough and hemoptysis.   Cardiovascular: Negative for chest pain and palpitations.  Gastrointestinal: Negative for heartburn and nausea.  Genitourinary: Negative for dysuria and urgency.  Musculoskeletal: Negative for joint pain and myalgias.  Skin: Negative for rash.  Neurological: Negative for dizziness and headaches.  Psychiatric/Behavioral: Negative for depression and hallucinations.     DRUG ALLERGIES:   Allergies  Allergen Reactions  . Latex Anaphylaxis and Rash  . Gabapentin Nausea And Vomiting and Other (See Comments)   VITALS:  Blood pressure 125/68, pulse 75, temperature 98.4 F (36.9 C), temperature source Oral, resp. rate 20, height 5' 3"  (1.6 m), weight 90.7 kg, SpO2 100 %. PHYSICAL EXAMINATION:  Physical Exam  GENERAL:  63 y.o.-year-old patient lying in the bed with no acute distress.  EYES: Pupils equal, round, reactive to light and accommodation. No scleral icterus. Extraocular muscles intact.  HEENT: Head atraumatic, normocephalic. Oropharynx and nasopharynx clear.  NECK:  Supple, no jugular venous distention. No thyroid enlargement, no tenderness.  LUNGS: Normal breath sounds bilaterally, no wheezing, rales,rhonchi or crepitation. No use of accessory muscles of respiration.  CARDIOVASCULAR: S1, S2 normal. No murmurs, rubs, or gallops.  ABDOMEN: Soft,  nontender, nondistended. Bowel sounds present. No organomegaly or mass.  EXTREMITIES: Positive pedal edema, cyanosis, or clubbing.  Left great toe lesion healing well NEUROLOGIC: Awake and alert and oriented.  Moving all extremities with no deficit PSYCHIATRIC: Patient awake and alert and oriented x3  SKIN: No obvious rash, lesion, or ulcer.   LABORATORY PANEL:  Female CBC Recent Labs  Lab 08/20/18 0508  WBC 6.6  HGB 7.0*  HCT 24.6*  PLT 194   ------------------------------------------------------------------------------------------------------------------ Chemistries  Recent Labs  Lab 08/20/18 0508  NA 136  K 4.1  CL 106  CO2 23  GLUCOSE 97  BUN 21  CREATININE 1.67*  CALCIUM 8.0*  MG 1.8   RADIOLOGY:  No results found. ASSESSMENT AND PLAN:   Patient 63 year old African-American female with history of chronic systolic CHF, COPD, diabetes who is presenting with generalized weakness  1.  Acute on chronic anemia patient; patient was symptomatic.  Presented with hemoglobin of 5.1.  Transfused with 2 units of packed red blood cells and hemoglobin fairly stable at 7.0 today. Patient seen by gastroenterologist and had EGD done on 08/18/2018 which was unremarkable apart from erythematous mucosa in the greater curvature. Biopsied. Continue Protonix Patient was very lethargic yesterday and could not have a bowel prep for planned colonoscopy. Patient more awake and alert and oriented this morning.  Gastroenterologist to reevaluate and make decision regarding attempting colonoscopy tomorrow.   2.  Chronic systolic CHF currently compensated Husband later reported patient has been noted to have some shortness of breath and increasing weight gain.  Discontinued gentle IV fluid hydration previously and increased frequency of Lasix to twice daily.  This  was later changed to IV since patient was very lethargic to take p.o. meds previously.  3.  Diabetes type 2 we will continue Lantus  place on sliding scale insulin  4.  COPD without exacerbation continue inhalers  5.  Hyperlipidemia continue Lipitor  6.    Mild rash on the right hand At site of prior IV.  Not sure if this was due to reaction to meds given during recent EGD. No evidence of any airway compromise or systemic reaction. Monitor clinically for continued improvement  7.  Asymptomatic 3 beat run of V. Tach. Electrolytes are unremarkable.  Continue beta-blocker. 2D echocardiogram requested.  SCDs for DVT prophylaxis  All the records are reviewed and case discussed with Care Management/Social Worker. Management plans discussed with the patient, and they are in agreement. Updated husband today on treatment plans as outlined above.  All questions were answered.  CODE STATUS: Full Code  TOTAL TIME TAKING CARE OF THIS PATIENT: 33 minutes.   More than 50% of the time was spent in counseling/coordination of care: YES  POSSIBLE D/C IN 2 DAYS, DEPENDING ON CLINICAL CONDITION.   [name] M.D on 08/20/2018 at 12:40 PM  Between 7am to 6pm - Pager - [phone]  After 6pm go to [email or web address] - Proofreader  [facility]  Office  [phone]  CC: Primary care physician; Patient, No Pcp Per  Note: This dictation was prepared with Dragon dictation along with smaller phrase technology. Any transcriptional errors that result from this process are unintentional.

## 2018-08-20 NOTE — Plan of Care (Signed)
Patient was unable to finish bowel prep due to increased lethargy.

## 2018-08-20 NOTE — Evaluation (Addendum)
Physical Therapy Evaluation Patient Details Name: [name] MRN: [id number] DOB: [birth year] Today's Date: 08/20/2018   History of Present Illness  Alissah Redmon comes to [facility] after feelign weak, noted to have Hb in 5s. 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  Clinical Impression  Pt admitted with above diagnosis. Pt currently with functional limitations due to the deficits listed below (see "PT Problem List"). Upon entry, pt in bed, awake and agreeable to participate.  The pt is alert and oriented x4, pleasant, and generally a good historian, although some details are found to be questionable or inaccurate. She mentions that her hallux amputation was "4 weeks ago" when in fact it was done the first week of June ~8 weeks ago. Pt also denies incontinence at baseline when asked after being found in bed soaked in urine, then has large volume incontinence on floor, author things details unclear at this time. ModA to EOB, minA to stand, fair tolerance with short distance AMB- all mobility at baseline per patient. Hb: 7.0 this date, but pt denies symptoms associated with anemia during session. Pt does walk short distances at home, but also heavily relies on [facility] for mobility, largely unable to self propel. Pt has a very low level of function for her age, even in light of medical complexity, but she does not seem motivated nor think it normal for her to be any more independent at "such an old age." Pt reports weakness since being hospitalized 2 years ago (has been admitted several times since), but reports never working with PT at [facility]. With changes in coverage legislation and patient being a [facility] medicaid recipient, it is possible that she was no eligible for much needed PT services until recently, as availability of rehab services is somewhat improved this year. Pt will benefit from skilled PT intervention to increase independence and safety with basic mobility in  preparation for discharge to the venue listed below.       Follow Up Recommendations Home health PT;Supervision - Intermittent;Supervision for mobility/OOB    Equipment Recommendations  None recommended by PT    Recommendations for Other Services       Precautions / Restrictions Precautions Precautions: Fall Restrictions Weight Bearing Restrictions: No      Mobility  Bed Mobility Overal bed mobility: Needs Assistance;Modified Independent Bed Mobility: Supine to Sit     Supine to sit: Modified independent (Device/Increase time)     General bed mobility comments: needs modA at baseline  Transfers Overall transfer level: Needs assistance Equipment used: Rolling walker (2 wheeled) Transfers: Sit to/from Stand Sit to Stand: Min assist         General transfer comment: heavy effort and heavy BUE assist needed, but fluent in patterns and technique  Ambulation/Gait   Gait Distance (Feet): 15 Feet(gait reported as baseline, that she could easily go a bit further.) Assistive device: Rolling walker (2 wheeled) Gait Pattern/deviations: Step-to pattern     General Gait Details: slow, weak appearing, but steady, stops right in front of recliner aand has high-volume urinary incontinence onto floor, estimated 400-600cc.  Stairs            Wheelchair Mobility    Modified Rankin (Stroke Patients Only)       Balance Overall balance assessment: Modified Independent;No apparent balance deficits (not formally assessed)  Pertinent Vitals/Pain Pain Assessment: No/denies pain    Home Living Family/patient expects to be discharged to:: Private residence Living Arrangements: Spouse/significant other Available Help at Discharge: Family Type of Home: House Home Access: [place]: [place]: Environmental consultant - 2 wheels;Wheelchair - [employer];Tub bench      Prior  Function Level of Independence: Needs assistance   Gait / Transfers Assistance Needed: household distance AMB with RW (difficult); mostly WC not able to self propel.  ADL's / Homemaking Assistance Needed: Requires assistance from husband for ADLS and IADLs, but he is in a WC too. Pt had a paid caregiver, but she recently was fired for eing "dirty"        Hand Dominance   Dominant Hand: Right    Extremity/Trunk Assessment   Upper Extremity Assessment Upper Extremity Assessment: Generalized weakness;Overall [facility] for tasks assessed    Lower Extremity Assessment Lower Extremity Assessment: Generalized weakness;Overall WFL for tasks assessed       Communication   Communication: No difficulties  Cognition Arousal/Alertness: Awake/alert Behavior During Therapy: WFL for tasks assessed/performed Overall Cognitive Status: Within Functional Limits for tasks assessed                                        General Comments      Exercises     Assessment/Plan    PT Assessment Patient needs continued PT services  PT Problem List Decreased strength;Decreased activity tolerance;Decreased balance;Decreased mobility;Obesity;Decreased safety awareness;Decreased knowledge of precautions       PT Treatment Interventions DME instruction;Balance training;Gait training;Stair training;Patient/family education;Therapeutic activities;Functional mobility training;Therapeutic exercise    PT Goals (Current goals can be found in the Care Plan section)  Acute Rehab PT Goals Patient Stated Goal: Get home and be stronger PT Goal Formulation: With patient Time For Goal Achievement: 09/03/18 Potential to Achieve Goals: Good    Frequency Min 2X/week   Barriers to discharge Decreased caregiver support husband assists with mobility and ADL but is in [facility] at baseline also    Co-evaluation               AM-PAC PT "6 Clicks" Mobility  Outcome Measure Help needed turning from  your back to your side while in a flat bed without using bedrails?: A Lot Help needed moving from lying on your back to sitting on the side of a flat bed without using bedrails?: A Lot Help needed moving to and from a bed to a chair (including a wheelchair)?: A Little Help needed standing up from a chair using your arms (e.g., wheelchair or bedside chair)?: A Little Help needed to walk in hospital room?: A Little Help needed climbing 3-5 steps with a railing? : A Lot 6 Click Score: 15    End of Session Equipment Utilized During Treatment: Gait belt Activity Tolerance: Patient tolerated treatment well;Treatment limited secondary to medical complications ([id number])(IH:4.7 did not want to push pt too hard.) Patient left: in chair;with nursing/sitter in room;with call bell/phone within reach Nurse Communication: Mobility status PT Visit Diagnosis: Other abnormalities of gait and mobility (R26.89);Difficulty in walking, not elsewhere classified (R26.2);Muscle weakness (generalized) (M62.81)    Time: 4259-5638 PT Time Calculation (min) (ACUTE ONLY): 20 min   Charges:   PT Evaluation $PT Eval Moderate Complexity: 1 Mod         4:24 PM, 08/20/18 Etta Grandchild, PT, DPT  Physical Therapist - [facility]  [phone] ([place])   [place] C 08/20/2018, 4:17 PM

## 2018-08-21 ENCOUNTER — Inpatient Hospital Stay: Payer: Medicaid Other | Admitting: Certified Registered"

## 2018-08-21 ENCOUNTER — Encounter
Admission: EM | Disposition: A | Discharge status: Home Health | Payer: Self-pay | Source: Home / Self Care | Attending: Internal Medicine

## 2018-08-21 ENCOUNTER — Inpatient Hospital Stay
Admit: 2018-08-21 | Discharge: 2018-08-21 | Disposition: A | Discharge status: Home / Self Care | Payer: Medicaid Other | Attending: Internal Medicine | Admitting: Internal Medicine

## 2018-08-21 DIAGNOSIS — D500 Iron deficiency anemia secondary to blood loss (chronic): Secondary | ICD-10-CM

## 2018-08-21 HISTORY — PX: COLONOSCOPY WITH PROPOFOL: SHX5780

## 2018-08-21 LAB — GLUCOSE, CAPILLARY
Glucose-Capillary: 92 mg/dL (ref 70–99)
Glucose-Capillary: 63 mg/dL — ABNORMAL LOW (ref 70–99)
Glucose-Capillary: 110 mg/dL — ABNORMAL HIGH (ref 70–99)
Glucose-Capillary: 80 mg/dL (ref 70–99)
Glucose-Capillary: 105 mg/dL — ABNORMAL HIGH (ref 70–99)

## 2018-08-21 LAB — BASIC METABOLIC PANEL
Anion gap: 6 (ref 5–15)
BUN: 19 mg/dL (ref 8–23)
CO2: 25 mmol/L (ref 22–32)
Calcium: 8.2 mg/dL — ABNORMAL LOW (ref 8.9–10.3)
Chloride: 105 mmol/L (ref 98–111)
Creatinine, Ser: 1.72 mg/dL — ABNORMAL HIGH (ref 0.44–1.00)
GFR calc Af Amer: 36 mL/min — ABNORMAL LOW (ref >60)
GFR calc non Af Amer: 31 mL/min — ABNORMAL LOW (ref >60)
Glucose, Bld: 95 mg/dL (ref 70–99)
Potassium: 3.6 mmol/L (ref 3.5–5.1)
Sodium: 136 mmol/L (ref 135–145)

## 2018-08-21 LAB — CBC
HCT: 25.9 % — ABNORMAL LOW (ref 36.0–46.0)
Hemoglobin: 7.5 g/dL — ABNORMAL LOW (ref 12.0–15.0)
MCH: 23.1 pg — ABNORMAL LOW (ref 26.0–34.0)
MCHC: 29 g/dL — ABNORMAL LOW (ref 30.0–36.0)
MCV: 79.9 fL — ABNORMAL LOW (ref 80.0–100.0)
Platelets: 201 10*3/uL (ref 150–400)
RBC: 3.24 MIL/uL — ABNORMAL LOW (ref 3.87–5.11)
RDW: 22.5 % — ABNORMAL HIGH (ref 11.5–15.5)
WBC: 5.5 10*3/uL (ref 4.0–10.5)
nRBC: 0 % (ref 0.0–0.2)

## 2018-08-21 LAB — ECHOCARDIOGRAM COMPLETE
Height: 63 in
Weight: 3200 oz

## 2018-08-21 LAB — MAGNESIUM: Magnesium: 1.8 mg/dL (ref 1.7–2.4)

## 2018-08-21 LAB — SURGICAL PATHOLOGY

## 2018-08-21 SURGERY — COLONOSCOPY WITH PROPOFOL
Anesthesia: General

## 2018-08-21 MED ORDER — DEXTROSE 50 % IV SOLN
INTRAVENOUS | Status: AC
  Filled 2018-08-21: qty 50

## 2018-08-21 MED ORDER — PHENYLEPHRINE HCL (PRESSORS) 10 MG/ML IV SOLN
INTRAVENOUS | Status: DC | PRN
  Administered 2018-08-21 (×2): 100 ug via INTRAVENOUS

## 2018-08-21 MED ORDER — SODIUM CHLORIDE 0.9 % IV SOLN
INTRAVENOUS | Status: DC
  Administered 2018-08-21: 11:00:00 via INTRAVENOUS

## 2018-08-21 MED ORDER — DEXTROSE 50 % IV SOLN
INTRAVENOUS | Status: DC | PRN
  Administered 2018-08-21: 25 g via INTRAVENOUS

## 2018-08-21 MED ORDER — FOLIC ACID 1 MG PO TABS: 1.0000 mg | ORAL_TABLET | Freq: Every day | ORAL | 0 refills | Status: AC

## 2018-08-21 MED ORDER — CEPHALEXIN 250 MG PO CAPS: 250.0000 mg | ORAL_CAPSULE | Freq: Two times a day (BID) | ORAL | 0 refills | Status: AC

## 2018-08-21 MED ORDER — SODIUM CHLORIDE 0.9 % IV SOLN
300.0000 mg | Freq: Once | INTRAVENOUS | Status: AC
  Administered 2018-08-21: 300 mg via INTRAVENOUS
  Filled 2018-08-21: qty 15

## 2018-08-21 MED ORDER — FERROUS SULFATE 325 (65 FE) MG PO TABS: 325.0000 mg | ORAL_TABLET | Freq: Two times a day (BID) | ORAL | 0 refills | Status: DC

## 2018-08-21 MED ORDER — PROPOFOL 10 MG/ML IV BOLUS
INTRAVENOUS | Status: DC | PRN
  Administered 2018-08-21 (×7): 20 mg via INTRAVENOUS

## 2018-08-21 MED ORDER — CARVEDILOL 3.125 MG PO TABS: 3.1250 mg | ORAL_TABLET | Freq: Two times a day (BID) | ORAL | 0 refills | Status: DC

## 2018-08-21 NOTE — Progress Notes (Signed)
*  PRELIMINARY RESULTS* Echocardiogram 2D Echocardiogram has been performed.  [name] 08/21/2018, 8:31 AM

## 2018-08-21 NOTE — Discharge Summary (Addendum)
[facility] [place] NAME: [name]    MR#:  [id number]  DATE OF BIRTH:  05-21-1955  DATE OF ADMISSION:  08/17/2018   ADMITTING PHYSICIAN: Dustin Flock, MD  DATE OF DISCHARGE: 08/21/2018  PRIMARY CARE PHYSICIAN: Patient, No Pcp Per   ADMISSION DIAGNOSIS:  Iron deficiency anemia due to chronic blood loss [D50.0] DISCHARGE DIAGNOSIS:  Active Problems:   GI bleed  SECONDARY DIAGNOSIS:   Past Medical History:  Diagnosis Date   Cervical cancer (HCC)    CHF (congestive heart failure) (HCC)    Chronic anemia    COPD (chronic obstructive pulmonary disease) (HCC)    Diabetes mellitus without complication (HCC)    Gastric ulcer    NSTEMI (non-ST elevated myocardial infarction) ([place])    Upper GI bleed    HOSPITAL COURSE:   Addendum; this is an addendum to discharge summary done yesterday. Patient was discharged yesterday but husband did not come to pick patient up.  I spoke with patient's husband yesterday morning and told him patient will be discharged later in the day after colonoscopy.  Attempted calling husband after colonoscopy as well but no response.  Attempted calling again this morning but no response.  Case manager making attempts to reach husband as well. Patient otherwise clinically and hemodynamically stable.  Plans for discharge today once safe discharge planning in place.    Chief complaint; generalized weakness and fatigue  History of presenting complaint; [name]  is a 63 y.o. female with a known history of chronic systolic CHF,, recurrent anemia, diabetes type 2, chronic kidney disease and history of gastric ulcer who was hospitalized last month and was discharged home.  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  Patient did not follow-up with the podiatry.  Patient presents with generalized weakness and fatigue needs.  In the emergency room patient  again noted to be severely anemic with hemoglobin of 5.1..  She denies any throwing up of blood or blood in her stool.    Admitted for further evaluation   Hospital course; 1.Acute on chronic anemia patient; patient was symptomatic.  Presented with hemoglobin of 5.1.  Transfused with 2 units of packed red blood cells and hemoglobin fairly stable at 7.5 today.Patient seen by gastroenterologist and had EGD done on 08/18/2018 which was unremarkable apart from erythematous mucosa in the greater curvature. Biopsied.  Continued PPI.  Patient subsequently had colonoscopy done today which was unremarkable.  Patient cleared for discharge by gastroenterologist Dr. Marius Ditch.  Recommendation for outpatient follow-up with GI clinic.  Patient also given intravenous iron prior to discharge.  Appointment made to follow-up with outpatient hematology for outpatient IV iron administration and further evaluation.  Patient awake and alert and oriented.  No fatigue or weakness at this time.  Seen by physical therapist.  Recommendation was for home health with physical therapy on discharge  2.Chronic systolic CHF currently compensated Husband later reported patient has been noted to have some shortness of breath and increasing weight gain.  Discontinued gentle IV fluid hydration previously and increased frequency of Lasix to twice daily.  Noted slight decline in renal function with ongoing diuresis.  To resume p.o. Lasix on discharge.  2D echocardiogram done with improvement in ejection fraction to 50 to 55% during this admission.  3.Diabetes type 2 . Resume home regimen on discharge   4.COPD without exacerbation continue inhalers  5.Hyperlipidemia continue Lipitor  6.  Mild  rash on the right hand At site of prior IV.  Not sure if this was due to reaction to meds given during recent EGD. No evidence of any airway compromise or systemic reaction. Significantly improved.    7.  Asymptomatic 3 beat run of  V. Tach. Electrolytes are unremarkable.  Continue beta-blocker.  2D echocardiogram done during this admission with ejection fraction of 50 to 55%.  Evidence of mild LVH.  8.  Patient status post left great toe amputation for osteomyelitis during last admission. This was about a month ago.  Patient missed her follow-up appointment with podiatrist for stitches removal.  Nursing staff to remove stitches prior to discharge.  Site appears well opposed and healing.  Requested for podiatry consult on patient seen by Dr. Vickki Muff prior to discharge.  He recommended discharging patient on p.o. antibiotics.  Prescription for p.o. Keflex given.  Appointment made to follow-up with Dr. Vickki Muff in 2 weeks.  I updated patient's husband today on treatment and plans for discharge today.  DISCHARGE CONDITIONS:  Stable CONSULTS OBTAINED:  Treatment Team:  Jonathon Bellows, MD DRUG ALLERGIES:   Allergies  Allergen Reactions   Latex Anaphylaxis and Rash   Gabapentin Nausea And Vomiting and Other (See Comments)   DISCHARGE MEDICATIONS:   Allergies as of 08/21/2018      Reactions   Latex Anaphylaxis, Rash   Gabapentin Nausea And Vomiting, Other (See Comments)      Medication List    STOP taking these medications   clopidogrel 75 MG tablet Commonly known as: PLAVIX     TAKE these medications   carvedilol 3.125 MG tablet Commonly known as: COREG Take 1 tablet (3.125 mg total) by mouth 2 (two) times daily with a meal.   ferrous sulfate 325 (65 FE) MG tablet Take 1 tablet (325 mg total) by mouth 2 (two) times daily with a meal.   folic acid 1 MG tablet Commonly known as: FOLVITE Take 1 tablet (1 mg total) by mouth daily.   furosemide 40 MG tablet Commonly known as: Lasix Take 1 tablet (40 mg total) by mouth daily. What changed: when to take this   Insulin Glargine 100 UNIT/ML Solostar Pen Commonly known as: Lantus SoloStar Inject 15 Units into the skin daily. What changed:   how much to  take  when to take this   multivitamin with minerals Tabs tablet Take 1 tablet by mouth daily.   omeprazole 40 MG capsule Commonly known as: PriLOSEC Take 1 capsule (40 mg total) by mouth 2 (two) times a day.   pregabalin 150 MG capsule Commonly known as: LYRICA Take 150 mg by mouth 2 (two) times daily.   ProAir HFA 108 (90 Base) MCG/ACT inhaler Generic drug: albuterol Inhale 1-2 puffs into the lungs every 4 (four) hours as needed.   Symbicort 160-4.5 MCG/ACT inhaler Generic drug: budesonide-formoterol Inhale 2 puffs into the lungs 2 (two) times daily.   thiamine 100 MG tablet Take 1 tablet (100 mg total) by mouth daily.        DISCHARGE INSTRUCTIONS:   DIET:  Diabetic diet DISCHARGE CONDITION:  Stable ACTIVITY:  Activity as tolerated OXYGEN:  Home Oxygen: No.  Oxygen Delivery: room air DISCHARGE LOCATION:  home   If you experience worsening of your admission symptoms, develop shortness of breath, life threatening emergency, suicidal or homicidal thoughts you must seek medical attention immediately by calling 911 or calling your MD immediately  if symptoms less severe.  You Must read complete instructions/literature along with  all the possible adverse reactions/side effects for all the Medicines you take and that have been prescribed to you. Take any new Medicines after you have completely understood and accpet all the possible adverse reactions/side effects.   Please note  You were cared for by a hospitalist during your hospital stay. If you have any questions about your discharge medications or the care you received while you were in the hospital after you are discharged, you can call the unit and asked to speak with the hospitalist on call if the hospitalist that took care of you is not available. Once you are discharged, your primary care physician will handle any further medical issues. Please note that NO REFILLS for any discharge medications will be authorized  once you are discharged, as it is imperative that you return to your primary care physician (or establish a relationship with a primary care physician if you do not have one) for your aftercare needs so that they can reassess your need for medications and monitor your lab values.    On the day of Discharge:  VITAL SIGNS:  Blood pressure 130/68, pulse 72, temperature (!) 97.3 F (36.3 C), temperature source Tympanic, resp. rate 12, height 5' 3"  (1.6 m), weight 90.7 kg, SpO2 94 %. PHYSICAL EXAMINATION:  GENERAL:  63 y.o.-year-old patient lying in the bed with no acute distress.  EYES: Pupils equal, round, reactive to light and accommodation. No scleral icterus. Extraocular muscles intact.  HEENT: Head atraumatic, normocephalic. Oropharynx and nasopharynx clear.  NECK:  Supple, no jugular venous distention. No thyroid enlargement, no tenderness.  LUNGS: Normal breath sounds bilaterally, no wheezing, rales,rhonchi or crepitation. No use of accessory muscles of respiration.  CARDIOVASCULAR: S1, S2 normal. No murmurs, rubs, or gallops.  ABDOMEN: Soft, non-tender, non-distended. Bowel sounds present. No organomegaly or mass.  EXTREMITIES: No pedal edema, cyanosis, or clubbing.  Patient status post previous left great toe amputation.  Patient still has stitches in place but wound well opposed and healing. NEUROLOGIC: Cranial nerves II through XII are intact. Muscle strength 5/5 in all extremities. Sensation intact. Gait not checked.  PSYCHIATRIC: The patient is alert and oriented x 3.  SKIN: No obvious rash, lesion, or ulcer.  DATA REVIEW:   CBC Recent Labs  Lab 08/21/18 0445  WBC 5.5  HGB 7.5*  HCT 25.9*  PLT 201    Chemistries  Recent Labs  Lab 08/21/18 0445  NA 136  K 3.6  CL 105  CO2 25  GLUCOSE 95  BUN 19  CREATININE 1.72*  CALCIUM 8.2*  MG 1.8     Microbiology Results  Results for orders placed or performed during the hospital encounter of 08/17/18  SARS Coronavirus 2  [facility] order, Performed in [facility] hospital lab) Nasopharyngeal Nasopharyngeal Swab     Status: None   Collection Time: 08/17/18  3:04 PM   Specimen: Nasopharyngeal Swab  Result Value Ref Range Status   SARS Coronavirus 2 NEGATIVE NEGATIVE Final    Comment: (NOTE) If result is NEGATIVE SARS-CoV-2 target nucleic acids are NOT DETECTED. The SARS-CoV-2 RNA is generally detectable in upper and lower  respiratory specimens during the acute phase of infection. The lowest  concentration of SARS-CoV-2 viral copies this assay can detect is 250  copies / mL. A negative result does not preclude SARS-CoV-2 infection  and should not be used as the sole basis for treatment or other  patient management decisions.  A negative result may occur with  improper specimen collection / handling,  submission of specimen other  than nasopharyngeal swab, presence of viral mutation(s) within the  areas targeted by this assay, and inadequate number of viral copies  (<250 copies / mL). A negative result must be combined with clinical  observations, patient history, and epidemiological information. If result is POSITIVE SARS-CoV-2 target nucleic acids are DETECTED. The SARS-CoV-2 RNA is generally detectable in upper and lower  respiratory specimens dur ing the acute phase of infection.  Positive  results are indicative of active infection with SARS-CoV-2.  Clinical  correlation with patient history and other diagnostic information is  necessary to determine patient infection status.  Positive results do  not rule out bacterial infection or co-infection with other viruses. If result is PRESUMPTIVE POSTIVE SARS-CoV-2 nucleic acids MAY BE PRESENT.   A presumptive positive result was obtained on the submitted specimen  and confirmed on repeat testing.  While 2019 novel coronavirus  (SARS-CoV-2) nucleic acids may be present in the submitted sample  additional confirmatory testing may be necessary for  epidemiological  and / or clinical management purposes  to differentiate between  SARS-CoV-2 and other Sarbecovirus currently known to infect humans.  If clinically indicated additional testing with an alternate test  methodology [phone]) is advised. The SARS-CoV-2 RNA is generally  detectable in upper and lower respiratory sp ecimens during the acute  phase of infection. The expected result is Negative. Fact Sheet for Patients:  StrictlyIdeas.no Fact Sheet for Healthcare Providers: BankingDealers.co.za This test is not yet approved or cleared by the [place] FDA and has been authorized for detection and/or diagnosis of SARS-CoV-2 by FDA under an Emergency Use Authorization (EUA).  This EUA will remain in effect (meaning this test can be used) for the duration of the COVID-19 declaration under Section 564(b)(1) of the Act, 21 U.S.C. section 360bbb-3(b)(1), unless the authorization is terminated or revoked sooner. Performed at [facility], [address] [place], [place] [address]     RADIOLOGY:  No results found.   Management plans discussed with the patient, family and they are in agreement.  CODE STATUS: Full Code   TOTAL TIME TAKING CARE OF THIS PATIENT: 35 minutes.    [name] M.D on 08/21/2018 at 1:32 PM  Between 7am to 6pm - Pager - [phone]  After 6pm go to [email or web address] - Proofreader  [facility]  Office  [phone]  CC: Primary care physician; Patient, No Pcp Per   Note: This dictation was prepared with Dragon dictation along with smaller phrase technology. Any transcriptional errors that result from this process are unintentional.

## 2018-08-21 NOTE — Anesthesia Post-op Follow-up Note (Signed)
Anesthesia QCDR form completed.        

## 2018-08-21 NOTE — Progress Notes (Signed)
PT Cancellation Note  Patient Details Name: [name] MRN: [id number] DOB: [birth year]   Cancelled Treatment:    Reason Eval/Treat Not Completed: Patient at procedure or test/unavailable Pt out of room for colonoscopy, will try back later as time allows and pt is appropriate.  Kreg Shropshire, DPT 08/21/2018, 11:29 AM

## 2018-08-21 NOTE — Progress Notes (Signed)
Inpatient Diabetes Program Recommendations  AACE/ADA: New Consensus Statement on Inpatient Glycemic Control   Target Ranges:  Prepandial:   less than 140 mg/dL      Peak postprandial:   less than 180 mg/dL (1-2 hours)      Critically ill patients:  140 - 180 mg/dL   Results for ELYNN, PATTESON (MRN [id number]) as of 08/21/2018 11:39  Ref. Range 08/20/2018 07:34 08/20/2018 11:40 08/20/2018 16:44 08/20/2018 21:29 08/21/2018 07:32 08/21/2018 10:36  Glucose-Capillary Latest Ref Range: 70 - 99 mg/dL 95 87 138 (H) 160 (H) 92 63 (L)   Review of Glycemic Control  Diabetes history: DM2 Outpatient Diabetes medications: Lantus 16 units QHS Current orders for Inpatient glycemic control: Lantus 16 units QHS, Novolog 0-9 units TID with meals, Novolog 0-5 units QHS  Inpatient Diabetes Program Recommendations:   Insulin - Basal: Please consider decreasing Lantus to 14 units QHS.  Thanks, Barnie Alderman, RN, MSN, CDE Diabetes Coordinator Inpatient Diabetes Program [phone] (Team Pager from 8am to 5pm)

## 2018-08-21 NOTE — TOC Initial Note (Signed)
Transition of Care [facility]) - Initial/Assessment Note    Patient Details  Name: [name] MRN: [id number] Date of Birth: [birth year]  [facility]) CM/SW Contact:    Candie Chroman, LCSW Phone Number: 08/21/2018, 1:41 PM  Clinical Narrative:  Readmission prevention screen complete. CSW met with patient, introduced role, and explained that PT recommendations would be discussed. Patient agreeable to HHPT. She had PT through [facility] in June and is interested in resuming services with them. Referral made to [facility] and he will review. No further concerns. CSW encouraged patient to contact CSW as needed. CSW will continue to follow patient for support and facilitate return home today.                Expected Discharge Plan: [place] Barriers to Discharge: Barriers Resolved   Patient Goals and CMS Choice   CMS Medicare.gov Compare Post Acute Care list provided to:: Patient    Expected Discharge Plan and Services Expected Discharge Plan: [place] Choice: [place] arrangements for the past 2 months: Single Family Home Expected Discharge Date: 08/21/18               DME Arranged: N/A         HH Arranged: PT          Prior Living Arrangements/Services Living arrangements for the past 2 months: Single Family Home Lives with:: Spouse Patient language and need for interpreter reviewed:: Yes Do you feel safe going back to the place where you live?: Yes      Need for Family Participation in Patient Care: Yes (Comment) Care giver support system in place?: Yes (comment)   Criminal Activity/Legal Involvement Pertinent to Current Situation/Hospitalization: No - Comment as needed  Activities of Daily Living Home Assistive Devices/Equipment: Bedside commode/3-in-1 ADL Screening (condition at time of admission) Patient's cognitive ability adequate to safely complete daily activities?: Yes Is the  patient deaf or have difficulty hearing?: No Does the patient have difficulty seeing, even when wearing glasses/contacts?: No Does the patient have difficulty concentrating, remembering, or making decisions?: No Patient able to express need for assistance with ADLs?: Yes Does the patient have difficulty dressing or bathing?: Yes Independently performs ADLs?: No Communication: Independent Dressing (OT): Needs assistance Grooming: Needs assistance Feeding: Independent Bathing: Needs assistance Toileting: Needs assistance In/Out Bed: Needs assistance Walks in Home: Needs assistance Does the patient have difficulty walking or climbing stairs?: Yes Weakness of Legs: Both Weakness of Arms/Hands: None  Permission Sought/Granted Permission sought to share information with : Facility Art therapist granted to share information with : Yes, Verbal Permission Granted     Permission granted to share info w AGENCY: [facility]        Emotional Assessment Appearance:: Appears stated age Attitude/Demeanor/Rapport: Gracious Affect (typically observed): Accepting Orientation: : Oriented to Self, Oriented to Place, Oriented to  Time, Oriented to Situation Alcohol / Substance Use: Never Used Psych Involvement: No (comment)  Admission diagnosis:  Iron deficiency anemia due to chronic blood loss [D50.0] Patient Active Problem List   Diagnosis Date Noted  . Anxiety 08/15/2018  . Chronic back pain 08/15/2018  . GERD (gastroesophageal reflux disease) 08/15/2018  . History of carpal tunnel syndrome 08/15/2018  . HNP (herniated nucleus pulposus), lumbar 08/15/2018  . Hyperlipidemia 08/15/2018  . Hypertension 08/15/2018  . Insomnia 08/15/2018  . Left ventricular hypertrophy 08/15/2018  . Obesity 08/15/2018  .  Sleep apnea 08/15/2018  . Severe anemia 06/20/2018  . Diabetic polyneuropathy associated with type 2 diabetes mellitus ([place]) 02/23/2018  . Moderate tricuspid  regurgitation 02/18/2018  . Acute on chronic diastolic CHF (congestive heart failure) ([place]) 02/08/2018  . 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 Depression 02/05/2018  . Generalized abdominal pain 02/05/2018  . CHF (congestive heart failure) ([place]) 01/13/2018  . B12 deficiency 12/07/2017  . Iron deficiency anemia due to chronic blood loss 12/07/2017  . Folate deficiency 12/07/2017  . GIB (gastrointestinal bleeding) 11/03/2017  . COPD (chronic obstructive pulmonary disease) ([place]) 09/26/2017  . Symptomatic anemia 09/26/2017  . Acute on chronic systolic CHF (congestive heart failure) ([place]) 08/27/2017  . Anemia   . Weakness   . GI bleed 08/07/2017  . Chronic systolic heart failure ([place]) 12/28/2016  . Diabetes ([place]) 12/28/2016  . Tobacco use 12/28/2016  . NSTEMI (non-ST elevated myocardial infarction) ([place])   . Sepsis ([place]) 12/18/2016  . CHF exacerbation ([place]) 12/04/2015  . PNA (pneumonia) 10/28/2015  . COPD exacerbation ([place]) 08/29/2015  . Chronic hepatitis C without hepatic coma ([place]) 05/21/2014  . CAD (coronary artery disease) 07/19/2013  . Right arm pain 06/26/2013  . Cervical pain (neck) 03/28/2013  . Radiculopathy affecting upper extremity 03/28/2013  . Impaired renal function 12/21/2012  . Abnormal mammogram 12/20/2012  . Diabetes mellitus type 2, uncontrolled ([place]) 12/20/2012   PCP:  Patient, No Pcp Per Pharmacy:   [place], [place], [place] [address] Phone: [phone] Fax: [place], [place] [address] [place] [address] Phone: [phone] Fax: [phone]  [id number]: [phone]: [phone]    Social Determinants of Health (SDOH) Interventions    Readmission Risk Interventions Readmission Risk Prevention Plan 08/21/2018 09/30/2017   Transportation Screening Complete Complete  PCP or Specialist Appt within 5-7 Days - Complete  Home Care Screening - Complete  Medication Review (RN CM) - Complete  HRI or Home Care Consult Complete -  Palliative Care Screening Not Applicable -  Medication Review (RN Care Manager) Complete -  Some recent data might be hidden

## 2018-08-21 NOTE — TOC Transition Note (Signed)
Transition of Care [facility]) - CM/SW Discharge Note   Patient Details  Name: [name] MRN: [id number] Date of Birth: 1955/05/03  [facility]) CM/SW Contact:  Candie Chroman, LCSW Phone Number: 08/21/2018, 3:00 PM   Clinical Narrative: [facility] has been accepted by [facility]. Representative spoke to patient and she is requesting RN as well. They also asked that aide and social worker be added to the order. MD aware. No further concerns. CSW signing off.    Final next level of care: Home w Home Health Services Barriers to Discharge: Barriers Resolved   Patient Goals and CMS Choice   CMS Medicare.gov Compare Post Acute Care list provided to:: Patient Choice offered to / list presented to : Patient  Discharge Placement                    Patient and family notified of of transfer: 08/21/18  Discharge Plan and Services     Post Acute Care Choice: Home Health          DME Arranged: N/A         HH Arranged: RN, PT, Nurse's Aide, Social Work [employer] Agency: [place] ([place]) Date [place]: 08/21/18   Representative spoke with at [place]: [place] ([place]) Interventions     Readmission Risk Interventions Readmission Risk Prevention Plan 08/21/2018 09/30/2017  Transportation Screening Complete Complete  PCP or Specialist Appt within 5-7 Days - Complete  Home Care Screening - Complete  Medication Review (RN CM) - Complete  HRI or Home Care Consult Complete -  Palliative Care Screening Not Applicable -  Medication Review (RN Care Manager) Complete -  Some recent data might be hidden

## 2018-08-21 NOTE — Progress Notes (Signed)
08/21/2018 1500  Removed sutures from left great toe amputation at instruction from Dr. Vickki Muff who performed initial surgery.  Stitches were difficult to remove and the skin split open to some degree, with some pus draining.  Notified Dr. Vickki Muff who asked for formal consult.  Dr. Cleda Mccreedy consulted and saw the patient.  Will continue to monitor and assess.  Dola Arg[place], RN

## 2018-08-21 NOTE — Anesthesia Preprocedure Evaluation (Signed)
Anesthesia Evaluation  Patient identified by MRN, date of birth, ID band Patient awake    Reviewed: Allergy & Precautions, H&P , NPO status , Patient's Chart, lab work & pertinent test results, reviewed documented beta blocker date and time   Airway Mallampati: III   Neck ROM: full    Dental  (+) Poor Dentition   Pulmonary sleep apnea , pneumonia, COPD, Current Smoker,    Pulmonary exam normal        Cardiovascular Exercise Tolerance: Poor hypertension, On Medications + CAD, + Past MI and +CHF  Normal cardiovascular exam Rhythm:regular Rate:Normal     Neuro/Psych PSYCHIATRIC DISORDERS Anxiety Depression  Neuromuscular disease    GI/Hepatic PUD, GERD  ,(+) Hepatitis -  Endo/Other  negative endocrine ROSdiabetes, Poorly Controlled, Type 1, Insulin Dependent  Renal/GU CRFRenal disease  negative genitourinary   Musculoskeletal   Abdominal   Peds  Hematology  (+) Blood dyscrasia, anemia ,   Anesthesia Other Findings Past Medical History: No date: Cervical cancer (HCC) No date: CHF (congestive heart failure) (HCC) No date: Chronic anemia No date: COPD (chronic obstructive pulmonary disease) (HCC) No date: Diabetes mellitus without complication (HCC) No date: Gastric ulcer No date: NSTEMI (non-ST elevated myocardial infarction) ([place]) No date: Upper GI bleed Past Surgical History: 06/24/2018: AMPUTATION; Left     Comment:  Procedure: AMPUTATION GREAT TOE;  Surgeon: Samara Deist, DPM;  Location: [facility] ORS;  Service: Podiatry;                Laterality: Left; No date: APPENDECTOMY No date: CARDIAC CATHETERIZATION No date: CHOLECYSTECTOMY 09/28/2017: COLONOSCOPY; N/A     Comment:  Procedure: COLONOSCOPY;  Surgeon: Toledo, Benay Pike, MD;              Location: [facility] ENDOSCOPY;  Service: Gastroenterology;                Laterality: N/A; 11/06/2017: ENTEROSCOPY; Left     Comment:  Procedure: ENTEROSCOPY;   Surgeon: Jonathon Bellows, MD;                Location: [facility] ENDOSCOPY;  Service: Gastroenterology;                Laterality: Left; 06/22/2018: ENTEROSCOPY; N/A     Comment:  Procedure: ENTEROSCOPY;  Surgeon: Lin Landsman,               MD;  Location: [facility] ENDOSCOPY;  Service:               Gastroenterology;  Laterality: N/A; 08/18/2018: ENTEROSCOPY; N/A     Comment:  Procedure: ENTEROSCOPY;  Surgeon: Jonathon Bellows, MD;                Location: [facility] ENDOSCOPY;  Service: Gastroenterology;                Laterality: N/A; 12/19/2016: ESOPHAGOGASTRODUODENOSCOPY; N/A     Comment:  Procedure: ESOPHAGOGASTRODUODENOSCOPY (EGD);  Surgeon:               Lin Landsman, MD;  Location: [facility] ENDOSCOPY;                Service: Gastroenterology;  Laterality: N/A; 09/28/2017: ESOPHAGOGASTRODUODENOSCOPY; N/A     Comment:  Procedure: ESOPHAGOGASTRODUODENOSCOPY (EGD);  Surgeon:               Toledo, Benay Pike, MD;  Location: [facility] ENDOSCOPY;  Service: Gastroenterology;  Laterality: N/A; 08/08/2017: ESOPHAGOGASTRODUODENOSCOPY (EGD) WITH PROPOFOL; N/A     Comment:  Procedure: ESOPHAGOGASTRODUODENOSCOPY (EGD) WITH               PROPOFOL;  Surgeon: [name], MD;  Location: [facility]               ENDOSCOPY;  Service: Endoscopy;  Laterality: N/A; No date: heart surgery in [place] 12/23/2016: LEFT HEART CATH AND CORONARY ANGIOGRAPHY; N/A     Comment:  Procedure: LEFT HEART CATH AND CORONARY ANGIOGRAPHY;                Surgeon: Teodoro Spray, MD;  Location: [facility]              [facility];  Service: Cardiovascular;  Laterality: N/A; 06/26/2018: LOWER EXTREMITY ANGIOGRAPHY; Left     Comment:  Procedure: Lower Extremity Angiography;  Surgeon:               Katha Cabal, MD;  Location: [facility];               Service: Cardiovascular;  Laterality: Left; BMI    Body Mass Index: 35.43 kg/m     Reproductive/Obstetrics negative OB ROS                              Anesthesia Physical Anesthesia Plan  ASA: IV  Anesthesia Plan: General   Post-op Pain Management:    Induction:   PONV Risk Score and Plan:   Airway Management Planned:   Additional Equipment:   Intra-op Plan:   Post-operative Plan:   Informed Consent: I have reviewed the patients History and Physical, chart, labs and discussed the procedure including the risks, benefits and alternatives for the proposed anesthesia with the patient or authorized representative who has indicated his/her understanding and acceptance.     Dental Advisory Given  Plan Discussed with: CRNA  Anesthesia Plan Comments:         Anesthesia Quick Evaluation

## 2018-08-21 NOTE — Transfer of Care (Signed)
Immediate Anesthesia Transfer of Care Note  Patient: [name]  Procedure(s) Performed: COLONOSCOPY WITH PROPOFOL (N/A )  Patient Location: Endoscopy Unit  Anesthesia Type:General  Level of Consciousness: drowsy  Airway & Oxygen Therapy: Patient Spontanous Breathing and Patient connected to face mask oxygen  Post-op Assessment: Report given to RN and Post -op Vital signs reviewed and stable  Post vital signs: Reviewed and stable  Last Vitals:  Vitals Value Taken Time  BP 91/68 08/21/18 1114  Temp    Pulse 66 08/21/18 1114  Resp 12 08/21/18 1114  SpO2 96 % 08/21/18 1114  Vitals shown include unvalidated device data.  Last Pain:  Vitals:   08/21/18 0831  TempSrc: Oral  PainSc:          Complications: No apparent anesthesia complications

## 2018-08-21 NOTE — Op Note (Signed)
[facility] Gastroenterology Patient Name: [name] Procedure Date: 08/21/2018 10:15 AM MRN: [id number] Account #: [id number] Date of Birth: 1955-11-15 Admit Type: Outpatient Age: 63 Room: [facility] ENDO ROOM 1 Gender: Female Note Status: Finalized Procedure:            Colonoscopy Indications:          Iron deficiency anemia secondary to chronic blood loss Providers:            Lin Landsman MD, MD Medicines:            Monitored Anesthesia Care Complications:        No immediate complications. Estimated blood loss: None. Procedure:            Pre-Anesthesia Assessment:                       - Prior to the procedure, a History and Physical was                        performed, and patient medications and allergies were                        reviewed. The patient is competent. The risks and                        benefits of the procedure and the sedation options and                        risks were discussed with the patient. All questions                        were answered and informed consent was obtained.                        Patient identification and proposed procedure were                        verified by the physician, the nurse, the                        anesthesiologist, the anesthetist and the technician in                        the pre-procedure area in the procedure room in the                        endoscopy suite. Mental Status Examination: alert and                        oriented. Airway Examination: normal oropharyngeal                        airway and neck mobility. Respiratory Examination:                        clear to auscultation. CV Examination: normal.                        Prophylactic Antibiotics: The patient does not require  prophylactic antibiotics. Prior Anticoagulants: The                        patient has taken no previous anticoagulant or                        antiplatelet agents. ASA  Grade Assessment: IV - A                        patient with severe systemic disease that is a constant                        threat to life. After reviewing the risks and benefits,                        the patient was deemed in satisfactory condition to                        undergo the procedure. The anesthesia plan was to use                        monitored anesthesia care (MAC). Immediately prior to                        administration of medications, the patient was                        re-assessed for adequacy to receive sedatives. The                        heart rate, respiratory rate, oxygen saturations, blood                        pressure, adequacy of pulmonary ventilation, and                        response to care were monitored throughout the                        procedure. The physical status of the patient was                        re-assessed after the procedure.                       After obtaining informed consent, the colonoscope was                        passed under direct vision. Throughout the procedure,                        the patient's blood pressure, pulse, and oxygen                        saturations were monitored continuously. The                        Colonoscope was introduced through the anus and  advanced to the the cecum, identified by appendiceal                        orifice and ileocecal valve. The colonoscopy was                        performed with moderate difficulty due to significant                        looping. Successful completion of the procedure was                        aided by applying abdominal pressure. The patient                        tolerated the procedure well. The quality of the bowel                        preparation was adequate. Findings:      The perianal and digital rectal examinations were normal. Pertinent       negatives include normal sphincter tone and no palpable rectal  lesions.      The colon (entire examined portion) appeared normal. Unable to intubate       TI despite several attempts due to significant looping. No fresh or old       blood is detected      The retroflexed view of the distal rectum and anal verge was normal and       showed no anal or rectal abnormalities. Impression:           - The entire examined colon is normal.                       - The distal rectum and anal verge are normal on                        retroflexion view.                       - No specimens collected. Recommendation:       - Return patient to hospital ward for ongoing care.                       - Diabetic (ADA) diet today.                       - Continue present medications.                       - Return to GI clinic in 2 weeks. Procedure Code(s):    --- Professional ---                       [phone], Colonoscopy, flexible; diagnostic, including                        collection of specimen(s) by brushing or washing, when                        performed (separate procedure) Diagnosis Code(s):    --- Professional ---  D50.0, Iron deficiency anemia secondary to blood loss                        (chronic) CPT copyright 2019 American Medical Association. All rights reserved. The codes documented in this report are preliminary and upon coder review may  be revised to meet current compliance requirements. Dr. Ulyess Mort Lin Landsman MD, MD 08/21/2018 11:10:17 AM This report has been signed electronically. Number of Addenda: 0 Note Initiated On: 08/21/2018 10:15 AM Scope Withdrawal Time: 0 hours 8 minutes 14 seconds  Total Procedure Duration: 0 hours 12 minutes 36 seconds  Estimated Blood Loss: Estimated blood loss: none.      [facility]

## 2018-08-21 NOTE — Progress Notes (Signed)
Day of Surgery   Subjective/Chief Complaint: Patient seen on consultation for her left great toe.  Had amputation a couple of months ago by Dr. Vickki Muff in the hospital.  Never presented for follow-up and still had sutures in the left great toe amputation site.   Objective: Vital signs in last 24 hours: Temp:  [97.3 F (36.3 C)-98.2 F (36.8 C)] 97.3 F (36.3 C) (08/04 1113) Pulse Rate:  [67-78] 72 (08/04 1143) Resp:  [12-20] 12 (08/04 1143) BP: (91-146)/(61-74) 130/68 (08/04 1143) SpO2:  [94 %-100 %] 94 % (08/04 1143) Last BM Date: 08/20/18  Intake/Output from previous day: 08/03 0701 - 08/04 0700 In: 323.6 [I.V.:323.6] Out: 1600 [Urine:1600] Intake/Output this shift: Total I/O In: 400 [I.V.:400] Out: -   A small open area is noted along the medial aspect of the incision which is otherwise well-healed.  The opening measures approximately 2 mm diameter.  A small amount of drainage and purulence noted.  No surrounding cellulitis.  Probes 3 to 4 mm deep.  No deeper areas of abscess are noted.  Pulses diminished but intact.  Lab Results:  Recent Labs    08/20/18 0508 08/21/18 0445  WBC 6.6 5.5  HGB 7.0* 7.5*  HCT 24.6* 25.9*  PLT 194 201   BMET Recent Labs    08/20/18 0508 08/21/18 0445  NA 136 136  K 4.1 3.6  CL 106 105  CO2 23 25  GLUCOSE 97 95  BUN 21 19  CREATININE 1.67* 1.72*  CALCIUM 8.0* 8.2*   PT/INR No results for input(s): LABPROT, INR in the last 72 hours. ABG No results for input(s): PHART, HCO3 in the last 72 hours.  Invalid input(s): PCO2, PO2  Studies/Results: No results found.  Anti-infectives: Anti-infectives (From admission, onward)   None      Assessment/Plan: s/p Procedure(s): COLONOSCOPY WITH PROPOFOL (N/A) Assessment: Status post amputation left great toe with small superficial abscess.   Plan: Debrided the area with a cotton tip applicator and Betadine and sterile gauze bandage applied.  Patient should still be stable for  discharge today on oral antibiotics.  Discussed with the patient daily dressing changes with Betadine and a light bandage or antibiotic ointment and a light bandage.  Follow-up in a couple of weeks outpatient with Dr. Vickki Muff.  LOS: 4 days    Durward Fortes 08/21/2018

## 2018-08-21 NOTE — Progress Notes (Signed)
08/21/2018 7:20 PM  Spoke with patient's husband who claimed he did not know patient was discharging, despite multiple attempts to reach him via phone throughout the day by RN and MD, as well as the patient herself.  Spoke with Dr. Posey Pronto who instructed to hold discharge for the evening and have the patient leave early as possible tomorrow morning.  Dola Argyle, RN

## 2018-08-22 ENCOUNTER — Encounter: Payer: Self-pay | Admitting: Gastroenterology

## 2018-08-22 LAB — GLUCOSE, CAPILLARY
Glucose-Capillary: 143 mg/dL — ABNORMAL HIGH (ref 70–99)
Glucose-Capillary: 128 mg/dL — ABNORMAL HIGH (ref 70–99)

## 2018-08-22 LAB — ERYTHROPOIETIN: Erythropoietin: 128.2 m[IU]/mL — ABNORMAL HIGH (ref 2.6–18.5)

## 2018-08-22 MED ORDER — SODIUM CHLORIDE 0.9 % IV SOLN
300.0000 mg | Freq: Once | INTRAVENOUS | Status: DC
  Filled 2018-08-22: qty 15

## 2018-08-22 NOTE — Plan of Care (Signed)
  Problem: Education: Goal: Knowledge of General Education information will improve Description: Including pain rating scale, medication(s)/side effects and non-pharmacologic comfort measures Outcome: Progressing Pt understands the reasoning behind her hospitalization.  She is pending D/C home and is awaiting EMS for transport home. She has no further questions. Will continue to monitor and assess.   Problem: Health Behavior/Discharge Planning: Goal: Ability to manage health-related needs will improve Outcome: Progressing Pt understands the importance of her f/u appointments and wound management.    Problem: Clinical Measurements: Goal: Ability to maintain clinical measurements within normal limits will improve Outcome: Progressing Pt was slightly hypertensive this AM.  Her anti-hypertensive meds were given per MD's orders.   Problem: Clinical Measurements: Goal: Will remain free from infection Outcome: Progressing S?sx of infection monitor and assessed q-shift/ PRN.  Pt has been afebrile thus far.  Her left foot wound is not draining nor is it giving off a foul smell.  Will continue to monitor and assess.    Problem: Clinical Measurements: Goal: Respiratory complications will improve Outcome: Progressing Respiratory status monitored and assessed q-shift/ PRN, along with VS.  Pt is on RA with O2 saturations at 100% and respiration rate of 18 breaths per minute.  Will continue to monitor and assess.   Problem: Nutrition: Goal: Adequate nutrition will be maintained Outcome: Progressing Pt is on a Carb modified diet.  She was able to tolerate 100% of her breakfast and is currently eating her lunch.   Problem: Coping: Goal: Level of anxiety will decrease Outcome: Progressing Pt has not expressed anxiety r/t her hospitalization.  Will continue to monitor and assess.   Problem: Elimination: Goal: Will not experience complications related to bowel motility Outcome: Progressing Pt has  an external female catheter to obtain her urine during this hospitalization.  She is x1 assist to the BCS.   Problem: Safety: Goal: Ability to remain free from injury will improve Outcome: Progressing Instructed pt to utilize RN call light for assistance.  Hourly rounds performed.  Bed alarm implemented to keep pt safe from falls.  Settings set to third most sensitive mode.  Bed in lowest position locked with two upper side rails engaged.  Belongings and call light within reach.  Problem: Skin Integrity: Goal: Risk for impaired skin integrity will decrease Outcome: Progressing Skin integrity monitored and assessed q-shift/ PRN.  Instructed pt to turn q2 hours ro prevent further skin impairment.  Tubes and drains assessed for device relate pressures sores.  Will continue to monitor and assess.

## 2018-08-22 NOTE — Progress Notes (Signed)
Pt has been d/c'd per MD's orders.  She was transported home via EMS.  VS WNL and pt had no further questions.

## 2018-08-22 NOTE — Progress Notes (Signed)
[facility] [place] NAME: [name]    MR#:  [id number]  DATE OF BIRTH:  Mar 28, 1955  SUBJECTIVE:  CHIEF COMPLAINT:   Chief Complaint  Patient presents with  . Leg Swelling  . Cough    Please refer to addendum done to discharge summary done yesterday for further details.  Patient was discharged yesterday but husband did not come to pick patient up.  I spoke with patient's husband yesterday morning and told him patient will be discharged later in the day after colonoscopy.  Attempted calling husband after colonoscopy as well but no response.  Attempted calling again this morning but no response.  Case manager making attempts to reach husband as well. Patient otherwise clinically and hemodynamically stable.  Plans for discharge today once safe discharge planning in place.  REVIEW OF SYSTEMS:  Review of Systems  Constitutional: Negative for chills and fever.  HENT: Negative for hearing loss and tinnitus.   Eyes: Negative for blurred vision and double vision.  Respiratory: Negative for cough and hemoptysis.   Cardiovascular: Negative for chest pain and palpitations.  Gastrointestinal: Negative for heartburn and nausea.  Genitourinary: Negative for dysuria and urgency.  Musculoskeletal: Negative for joint pain and myalgias.  Skin: Negative for rash.  Neurological: Negative for dizziness and headaches.  Psychiatric/Behavioral: Negative for depression and hallucinations.     DRUG ALLERGIES:   Allergies  Allergen Reactions  . Latex Anaphylaxis and Rash  . Gabapentin Nausea And Vomiting and Other (See Comments)   VITALS:  Blood pressure (!) 153/85, pulse 84, temperature (!) 97.4 F (36.3 C), temperature source Oral, resp. rate 18, height 5' 3"  (1.6 m), weight 90.7 kg, SpO2 100 %. PHYSICAL EXAMINATION:  Physical Exam  GENERAL:  63 y.o.-year-old patient lying in the bed with no acute distress.  EYES: Pupils equal, round,  reactive to light and accommodation. No scleral icterus. Extraocular muscles intact.  HEENT: Head atraumatic, normocephalic. Oropharynx and nasopharynx clear.  NECK:  Supple, no jugular venous distention. No thyroid enlargement, no tenderness.  LUNGS: Normal breath sounds bilaterally, no wheezing, rales,rhonchi or crepitation. No use of accessory muscles of respiration.  CARDIOVASCULAR: S1, S2 normal. No murmurs, rubs, or gallops.  ABDOMEN: Soft, nontender, nondistended. Bowel sounds present. No organomegaly or mass.  EXTREMITIES: Positive pedal edema, cyanosis, or clubbing.  Left great toe lesion healing well NEUROLOGIC: Awake and alert and oriented.  Moving all extremities with no deficit PSYCHIATRIC: Patient awake and alert and oriented x3  SKIN: No obvious rash, lesion, or ulcer.   LABORATORY PANEL:  Female CBC Recent Labs  Lab 08/21/18 0445  WBC 5.5  HGB 7.5*  HCT 25.9*  PLT 201   ------------------------------------------------------------------------------------------------------------------ Chemistries  Recent Labs  Lab 08/21/18 0445  NA 136  K 3.6  CL 105  CO2 25  GLUCOSE 95  BUN 19  CREATININE 1.72*  CALCIUM 8.2*  MG 1.8   RADIOLOGY:  No results found. ASSESSMENT AND PLAN:   1.Acute on chronic anemia patient; patient was symptomatic.  Presented with hemoglobin of 5.1. Transfused with 2 units of packed red blood cells and hemoglobin fairly stable at 7.5 .Patient seen by gastroenterologist and had EGD done on 08/18/2018 which was unremarkable apart from erythematous mucosa in the greater curvature. Biopsied.  Continued PPI.  Patient subsequently had colonoscopy done today which was unremarkable.  Patient cleared for discharge by gastroenterologist Dr. Marius Ditch.  Recommendation for outpatient follow-up with GI clinic.  Patient also given intravenous iron  prior to discharge.  Appointment made to follow-up with outpatient hematology for outpatient IV iron administration  and further evaluation.  Patient awake and alert and oriented.  No fatigue or weakness at this time.  Seen by physical therapist.  Recommendation was for home health with physical therapy on discharge  2.Chronic systolic CHF currently compensated Husband later reported patient has been noted to have some shortness of breath and increasing weight gain. Discontinued gentle IV fluid hydration previously and increased frequency of Lasix to twice daily.  Noted slight decline in renal function with ongoing diuresis.  To resume p.o. Lasix on discharge.  2D echocardiogram done with improvement in ejection fraction to 50 to 55% during this admission.  3.Diabetes type 2 . Resume home regimen on discharge   4.COPD without exacerbation continue inhalers  5.Hyperlipidemia continue Lipitor  6.Mild rash on the right hand At site of prior IV. Not sure if this was due to reaction to meds given during recent EGD. No evidence of any airway compromise or systemic reaction. Significantly improved.    7.Asymptomatic 3 beat run of V. Tach. Electrolytes are unremarkable. Continue beta-blocker.  2D echocardiogram done during this admission with ejection fraction of 50 to 55%.  Evidence of mild LVH.  8.  Patient status post left great toe amputation for osteomyelitis during last admission. This was about a month ago.  Patient missed her follow-up appointment with podiatrist for stitches removal.  Nursing staff to remove stitches prior to discharge.  Site appears well opposed and healing.  Requested for podiatry consult on patient seen by Dr. Vickki Muff prior to discharge.  He recommended discharging patient on p.o. antibiotics.  Prescription for p.o. Keflex given.  Appointment made to follow-up with Dr. Vickki Muff in 2 weeks.  SCDs for DVT prophylaxis  All the records are reviewed and case discussed with Care Management/Social Worker. Management plans discussed with the patient, and they are in  agreement. Updated husband today on treatment plans as outlined above.  All questions were answered.  CODE STATUS: Full Code  TOTAL TIME TAKING CARE OF THIS PATIENT: 34 minutes.   More than 50% of the time was spent in counseling/coordination of care: YES  POSSIBLE D/C IN 2 DAYS, DEPENDING ON CLINICAL CONDITION.   Xai Frerking M.D on 08/22/2018 at 10:19 AM  Between 7am to 6pm - Pager - [phone]  After 6pm go to [email or web address] - Proofreader  [facility]  Office  [phone]  CC: Primary care physician; Patient, No Pcp Per  Note: This dictation was prepared with Dragon dictation along with smaller phrase technology. Any transcriptional errors that result from this process are unintentional.

## 2018-08-23 ENCOUNTER — Other Ambulatory Visit: Payer: Self-pay

## 2018-08-23 DIAGNOSIS — K250 Acute gastric ulcer with hemorrhage: Secondary | ICD-10-CM

## 2018-08-23 DIAGNOSIS — D500 Iron deficiency anemia secondary to blood loss (chronic): Secondary | ICD-10-CM

## 2018-08-23 LAB — CELIAC DISEASE PANEL
Endomysial Ab, IgA: NEGATIVE
IgA: 594 mg/dL — ABNORMAL HIGH (ref 87–352)
Tissue Transglutaminase Ab, IgA: <2 U/mL (ref 0–3)

## 2018-08-28 NOTE — Anesthesia Postprocedure Evaluation (Signed)
Anesthesia Post Note  Patient: [name]  Procedure(s) Performed: COLONOSCOPY WITH PROPOFOL (N/A )  Patient location during evaluation: PACU Anesthesia Type: General Level of consciousness: awake and alert Pain management: pain level controlled Vital Signs Assessment: post-procedure vital signs reviewed and stable Respiratory status: spontaneous breathing, nonlabored ventilation, respiratory function stable and patient connected to nasal cannula oxygen Cardiovascular status: blood pressure returned to baseline and stable Postop Assessment: no apparent nausea or vomiting Anesthetic complications: no     Last Vitals:  Vitals:   08/22/18 0805 08/22/18 1409  BP: (!) 153/85 139/69  Pulse: 84 77  Resp:  20  Temp:  36.7 C  SpO2:  100%    Last Pain:  Vitals:   08/22/18 1409  TempSrc: Oral  PainSc:                  Molli Barrows

## 2018-08-29 ENCOUNTER — Emergency Department
Admission: EM | Admit: 2018-08-29 | Discharge: 2018-08-29 | Disposition: A | Discharge status: Home / Self Care | Payer: Medicaid Other | Attending: Emergency Medicine | Admitting: Emergency Medicine

## 2018-08-29 ENCOUNTER — Emergency Department: Payer: Medicaid Other

## 2018-08-29 ENCOUNTER — Other Ambulatory Visit: Payer: Self-pay

## 2018-08-29 ENCOUNTER — Encounter: Payer: Self-pay | Admitting: Emergency Medicine

## 2018-08-29 DIAGNOSIS — I509 Heart failure, unspecified: Secondary | ICD-10-CM | POA: Insufficient documentation

## 2018-08-29 DIAGNOSIS — E119 Type 2 diabetes mellitus without complications: Secondary | ICD-10-CM | POA: Diagnosis not present

## 2018-08-29 DIAGNOSIS — M7989 Other specified soft tissue disorders: Secondary | ICD-10-CM | POA: Diagnosis present

## 2018-08-29 DIAGNOSIS — J449 Chronic obstructive pulmonary disease, unspecified: Secondary | ICD-10-CM | POA: Insufficient documentation

## 2018-08-29 DIAGNOSIS — Z794 Long term (current) use of insulin: Secondary | ICD-10-CM | POA: Insufficient documentation

## 2018-08-29 DIAGNOSIS — F17210 Nicotine dependence, cigarettes, uncomplicated: Secondary | ICD-10-CM | POA: Diagnosis not present

## 2018-08-29 DIAGNOSIS — R600 Localized edema: Secondary | ICD-10-CM | POA: Insufficient documentation

## 2018-08-29 DIAGNOSIS — R0600 Dyspnea, unspecified: Secondary | ICD-10-CM | POA: Diagnosis not present

## 2018-08-29 DIAGNOSIS — R609 Edema, unspecified: Secondary | ICD-10-CM

## 2018-08-29 DIAGNOSIS — Z91040 Latex allergy status: Secondary | ICD-10-CM | POA: Insufficient documentation

## 2018-08-29 DIAGNOSIS — I110 Hypertensive heart disease with heart failure: Secondary | ICD-10-CM | POA: Insufficient documentation

## 2018-08-29 LAB — COMPREHENSIVE METABOLIC PANEL
ALT: 10 U/L (ref 0–44)
AST: 23 U/L (ref 15–41)
Albumin: 2.6 g/dL — ABNORMAL LOW (ref 3.5–5.0)
Alkaline Phosphatase: 83 U/L (ref 38–126)
Anion gap: 8 (ref 5–15)
BUN: 17 mg/dL (ref 8–23)
CO2: 19 mmol/L — ABNORMAL LOW (ref 22–32)
Calcium: 8.1 mg/dL — ABNORMAL LOW (ref 8.9–10.3)
Chloride: 109 mmol/L (ref 98–111)
Creatinine, Ser: 1.2 mg/dL — ABNORMAL HIGH (ref 0.44–1.00)
GFR calc Af Amer: 56 mL/min — ABNORMAL LOW (ref >60)
GFR calc non Af Amer: 48 mL/min — ABNORMAL LOW (ref >60)
Glucose, Bld: 168 mg/dL — ABNORMAL HIGH (ref 70–99)
Potassium: 3.8 mmol/L (ref 3.5–5.1)
Sodium: 136 mmol/L (ref 135–145)
Total Bilirubin: 0.6 mg/dL (ref 0.3–1.2)
Total Protein: 7.3 g/dL (ref 6.5–8.1)

## 2018-08-29 LAB — CBC
HCT: 25.3 % — ABNORMAL LOW (ref 36.0–46.0)
Hemoglobin: 7.3 g/dL — ABNORMAL LOW (ref 12.0–15.0)
MCH: 23.2 pg — ABNORMAL LOW (ref 26.0–34.0)
MCHC: 28.9 g/dL — ABNORMAL LOW (ref 30.0–36.0)
MCV: 80.6 fL (ref 80.0–100.0)
Platelets: 131 10*3/uL — ABNORMAL LOW (ref 150–400)
RBC: 3.14 MIL/uL — ABNORMAL LOW (ref 3.87–5.11)
RDW: 23.9 % — ABNORMAL HIGH (ref 11.5–15.5)
WBC: 6.1 10*3/uL (ref 4.0–10.5)
nRBC: 0 % (ref 0.0–0.2)

## 2018-08-29 LAB — TROPONIN I (HIGH SENSITIVITY)
Troponin I (High Sensitivity): 19 ng/L — ABNORMAL HIGH (ref <18)
Troponin I (High Sensitivity): 19 ng/L — ABNORMAL HIGH (ref <18)

## 2018-08-29 MED ORDER — FUROSEMIDE 10 MG/ML IJ SOLN
60.0000 mg | Freq: Once | INTRAMUSCULAR | Status: AC
  Administered 2018-08-29: 60 mg via INTRAVENOUS
  Filled 2018-08-29: qty 8

## 2018-08-29 NOTE — ED Notes (Signed)
Called for EMS and talked with Zack.

## 2018-08-29 NOTE — ED Notes (Signed)
Pt sitting up in bed at this time eating candy and watching TV, NAD noted, urine noted in suction cannister. Will continue to monitor for further patient needs.

## 2018-08-29 NOTE — ED Provider Notes (Signed)
[facility] Emergency Department Provider Note  Time seen: 6:27 PM  I have reviewed the triage vital signs and the nursing notes.   HISTORY  Chief Complaint Leg Swelling   HPI [name] is a 64 y.o. female with a past medical history of CHF, COPD, diabetes, presents to the emergency department for lower extremity edema and mild shortness of breath.  According to the patient she recently had a left great toe amputation performed approximately 2 weeks ago.  She states since the amputation she has felt mild shortness of breath with increasing lower extremity edema.  Patient currently states she takes 40 mg of Lasix once daily, is not wearing any compression stockings.  Denies any chest pain.  Denies any fever cough or congestion.  No abdominal pain.   Past Medical History:  Diagnosis Date  . Cervical cancer ([place])   . CHF (congestive heart failure) ([place])   . Chronic anemia   . COPD (chronic obstructive pulmonary disease) ([place])   . Diabetes mellitus without complication ([place])   . Gastric ulcer   . NSTEMI (non-ST elevated myocardial infarction) ([place])   . Upper GI bleed     Patient Active Problem List   Diagnosis Date Noted  . Anxiety 08/15/2018  . Chronic back pain 08/15/2018  . GERD (gastroesophageal reflux disease) 08/15/2018  . History of carpal tunnel syndrome 08/15/2018  . HNP (herniated nucleus pulposus), lumbar 08/15/2018  . Hyperlipidemia 08/15/2018  . Hypertension 08/15/2018  . Insomnia 08/15/2018  . Left ventricular hypertrophy 08/15/2018  . Obesity 08/15/2018  . Sleep apnea 08/15/2018  . Severe anemia 06/20/2018  . Diabetic polyneuropathy associated with type 2 diabetes mellitus ([place]) 02/23/2018  . Moderate tricuspid regurgitation 02/18/2018  . Acute on chronic diastolic CHF (congestive heart failure) ([place]) 02/08/2018  . 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 Depression 02/05/2018  . Generalized abdominal pain 02/05/2018  .  CHF (congestive heart failure) ([place]) 01/13/2018  . B12 deficiency 12/07/2017  . Iron deficiency anemia due to chronic blood loss 12/07/2017  . Folate deficiency 12/07/2017  . GIB (gastrointestinal bleeding) 11/03/2017  . COPD (chronic obstructive pulmonary disease) ([place]) 09/26/2017  . Symptomatic anemia 09/26/2017  . Acute on chronic systolic CHF (congestive heart failure) ([place]) 08/27/2017  . Anemia   . Weakness   . GI bleed 08/07/2017  . Chronic systolic heart failure ([place]) 12/28/2016  . Diabetes ([place]) 12/28/2016  . Tobacco use 12/28/2016  . NSTEMI (non-ST elevated myocardial infarction) ([place])   . Sepsis ([place]) 12/18/2016  . CHF exacerbation ([place]) 12/04/2015  . PNA (pneumonia) 10/28/2015  . COPD exacerbation ([place]) 08/29/2015  . Chronic hepatitis C without hepatic coma ([place]) 05/21/2014  . CAD (coronary artery disease) 07/19/2013  . Right arm pain 06/26/2013  . Cervical pain (neck) 03/28/2013  . Radiculopathy affecting upper extremity 03/28/2013  . Impaired renal function 12/21/2012  . Abnormal mammogram 12/20/2012  . Diabetes mellitus type 2, uncontrolled ([place]) 12/20/2012    Past Surgical History:  Procedure Laterality Date  . AMPUTATION Left 06/24/2018   Procedure: AMPUTATION GREAT TOE;  Surgeon: Samara Deist, DPM;  Location: [facility] ORS;  Service: Podiatry;  Laterality: Left;  . APPENDECTOMY    . CARDIAC CATHETERIZATION    . CHOLECYSTECTOMY    . COLONOSCOPY N/A 09/28/2017   Procedure: COLONOSCOPY;  Surgeon: Toledo, Benay Pike, MD;  Location: [facility] ENDOSCOPY;  Service: Gastroenterology;  Laterality: N/A;  . COLONOSCOPY WITH PROPOFOL N/A 08/21/2018   Procedure: COLONOSCOPY WITH PROPOFOL;  Surgeon: Lin Landsman, MD;  Location: [facility] ENDOSCOPY;  Service: Gastroenterology;  Laterality: N/A;  . ENTEROSCOPY Left 11/06/2017   Procedure: ENTEROSCOPY;  Surgeon: Jonathon Bellows, MD;  Location: [facility] ENDOSCOPY;  Service: Gastroenterology;  Laterality: Left;  . ENTEROSCOPY N/A 06/22/2018    Procedure: ENTEROSCOPY;  Surgeon: Lin Landsman, MD;  Location[facility] ENDOSCOPY;  Service: Gastroenterology;  Laterality: N/A;  . ENTEROSCOPY N/A 08/18/2018   Procedure: ENTEROSCOPY;  Surgeon: Jonathon Bellows, MD;  Location: [facility] ENDOSCOPY;  Service: Gastroenterology;  Laterality: N/A;  . ESOPHAGOGASTRODUODENOSCOPY N/A 12/19/2016   Procedure: ESOPHAGOGASTRODUODENOSCOPY (EGD);  Surgeon: Lin Landsman, MD;  Location: [facility] ENDOSCOPY;  Service: Gastroenterology;  Laterality: N/A;  . ESOPHAGOGASTRODUODENOSCOPY N/A 09/28/2017   Procedure: ESOPHAGOGASTRODUODENOSCOPY (EGD);  Surgeon: Toledo, Benay Pike, MD;  Location: [facility] ENDOSCOPY;  Service: Gastroenterology;  Laterality: N/A;  . ESOPHAGOGASTRODUODENOSCOPY (EGD) WITH PROPOFOL N/A 08/08/2017   Procedure: ESOPHAGOGASTRODUODENOSCOPY (EGD) WITH PROPOFOL;  Surgeon: [name], MD;  Location: [facility] ENDOSCOPY;  Service: Endoscopy;  Laterality: N/A;  . [place] in [place]    . LEFT HEART CATH AND CORONARY ANGIOGRAPHY N/A 12/23/2016   Procedure: LEFT HEART CATH AND CORONARY ANGIOGRAPHY;  Surgeon: Teodoro Spray, MD;  Location: [place] CV LAB;  Service: Cardiovascular;  Laterality: N/A;  . LOWER EXTREMITY ANGIOGRAPHY Left 06/26/2018   Procedure: Lower Extremity Angiography;  Surgeon: Katha Cabal, MD;  Location: [place] CV LAB;  Service: Cardiovascular;  Laterality: Left;    Prior to Admission medications   Medication Sig Start Date End Date Taking? Authorizing Provider  carvedilol (COREG) 3.125 MG tablet Take 1 tablet (3.125 mg total) by mouth 2 (two) times daily with a meal. 08/21/18   Stark Jock, Jude, MD  ferrous sulfate 325 (65 FE) MG tablet Take 1 tablet (325 mg total) by mouth 2 (two) times daily with a meal. 08/21/18   Stark Jock, Jude, MD  folic acid (FOLVITE) 1 MG tablet Take 1 tablet (1 mg total) by mouth daily. 08/21/18   Stark Jock Jude, MD  furosemide (LASIX) 40 MG tablet Take 1 tablet (40 mg total) by mouth daily. Patient taking  differently: Take 40 mg by mouth 2 (two) times daily.  01/16/18 01/16/19  Gladstone Lighter, MD  Insulin Glargine (LANTUS SOLOSTAR) 100 UNIT/ML Solostar Pen Inject 15 Units into the skin daily. Patient taking differently: Inject 16 Units into the skin at bedtime.  08/29/17   Dustin Flock, MD  Multiple Vitamin (MULTIVITAMIN WITH MINERALS) TABS tablet Take 1 tablet by mouth daily. 06/28/18   Lang Snow, NP  omeprazole (PRILOSEC) 40 MG capsule Take 1 capsule (40 mg total) by mouth 2 (two) times a day. 08/15/18 10/14/18  Lin Landsman, MD  pregabalin (LYRICA) 150 MG capsule Take 150 mg by mouth 2 (two) times daily. 08/03/18   [provider]  PROAIR HFA 108 (90 Base) MCG/ACT inhaler Inhale 1-2 puffs into the lungs every 4 (four) hours as needed. 10/10/17   [provider]  SYMBICORT 160-4.5 MCG/ACT inhaler Inhale 2 puffs into the lungs 2 (two) times daily. 10/10/17   [provider]  thiamine 100 MG tablet Take 1 tablet (100 mg total) by mouth daily. 06/28/18   Lang Snow, NP    Allergies  Allergen Reactions  . Latex Anaphylaxis and Rash  . Gabapentin Nausea And Vomiting and Other (See Comments)    Family History  Problem Relation Age of Onset  . Hypertension Mother   . Diabetes Mellitus II Mother   . Breast cancer Mother   . Heart disease Father   .  Cancer Sister     Social History Social History   Tobacco Use  . Smoking status: Current Some Day Smoker    Packs/day: 0.25    Types: Cigarettes  . Smokeless tobacco: Never Used  Substance Use Topics  . Alcohol use: No  . Drug use: No    Review of Systems Constitutional: Negative for fever. ENT: Negative for recent illness/congestion Cardiovascular: Negative for chest pain. Respiratory: Mild shortness of breath.  Negative for cough. Gastrointestinal: Negative for abdominal pain Musculoskeletal: Lower extremity swelling bilaterally. Skin: Negative for skin complaints   Neurological: Negative for headache All other ROS negative  ____________________________________________   PHYSICAL EXAM:  VITAL SIGNS: ED Triage Vitals  Enc Vitals Group     BP 08/29/18 1755 (!) 152/75     Pulse Rate 08/29/18 1755 86     Resp 08/29/18 1755 (!) 26     Temp 08/29/18 1755 98.2 F (36.8 C)     Temp Source 08/29/18 1755 Oral     SpO2 08/29/18 1755 99 %     Weight 08/29/18 1753 190 lb (86.2 kg)     Height 08/29/18 1753 5' 3"  (1.6 m)     Head Circumference --      Peak Flow --      Pain Score 08/29/18 1753 0     Pain Loc --      Pain Edu? --      Excl. in [place]? --    Constitutional: Alert and oriented. Well appearing and in no distress. Eyes: Normal exam ENT      Head: Normocephalic and atraumatic.      Mouth/Throat: Mucous membranes are moist. Cardiovascular: Normal rate, regular rhythm.  Respiratory: Normal respiratory effort without tachypnea nor retractions. Breath sounds are clear Gastrointestinal: Soft and nontender. No distention. Musculoskeletal: Nontender lower extremity, 3+ lower extreme edema bilaterally.  Patient has well-healing left great toe amputation with no dehiscence or signs of infection. Neurologic:  Normal speech and language. No gross focal neurologic deficits Skin:  Skin is warm, dry and intact.  Psychiatric: Mood and affect are normal.   ____________________________________________    EKG  EKG viewed and interpreted by myself shows a sinus rhythm at 84 bpm with a slightly widened QRS, normal axis, slight QTC prolongation otherwise normal intervals with nonspecific but no concerning ST changes.  ____________________________________________    RADIOLOGY  IMPRESSION:  Cardiomegaly with vascular congestion.   ____________________________________________   INITIAL IMPRESSION / ASSESSMENT AND PLAN / ED COURSE  Pertinent labs & imaging results that were available during my care of the patient were reviewed by me and considered  in my medical decision making (see chart for details).   Patient presents to the emergency department for increased lower extremity him over the past 2 weeks of mild shortness of breath.  Differential would include CHF exacerbation, worsening peripheral edema, pulmonary edema.  We will check labs including cardiac enzymes, chest x-ray and continue to closely monitor.  We will dose 40 mg of IV Lasix to expedite the patient's diuresis while awaiting results.  Patient continues to appear well.  Troponin of 19 which is largely unchanged from prior troponins.  We will repeat a troponin to ensure no significant change.  Patient is putting out urine after receiving IV Lasix.  Chest x-ray shows cardiomegaly but no pulmonary edema.  I believe the patient will be safe for outpatient therapy we will double the patient's Lasix from 40 mg daily to 80 mg daily for the next 5  days and have the patient follow-up with her doctor on Monday.  Patient agreeable to plan of care.  Patient care signed out to Dr. Charna Archer.  [name] was evaluated in Emergency Department on 08/29/2018 for the symptoms described in the history of present illness. She was evaluated in the context of the global COVID-19 pandemic, which necessitated consideration that the patient might be at risk for infection with the SARS-CoV-2 virus that causes COVID-19. Institutional protocols and algorithms that pertain to the evaluation of patients at risk for COVID-19 are in a state of rapid change based on information released by regulatory bodies including the CDC and federal and state organizations. These policies and algorithms were followed during the patient's care in the ED.  ____________________________________________   FINAL CLINICAL IMPRESSION(S) / ED DIAGNOSES  Peripheral edema Dyspnea   Harvest Dark, MD 08/29/18 2019

## 2018-08-29 NOTE — ED Triage Notes (Signed)
Pt presents to ED via [facility] with c/o bilateral lower extremity edema. Per EMS pt with severe edema bilaterally, pt with hx of CHF. Per EMS pt non-compliant with PCP appts due to not being able to make it. Pt is alert and oriented upon arrival.  98% RA 90  148/72 98.2  NKA

## 2018-08-29 NOTE — ED Notes (Signed)
Purewick placed at this time due to administration of lasix and patient with potential mobility issues in getting to the bathroom.

## 2018-08-29 NOTE — ED Notes (Signed)
2nd troponin collected by this RN. Pt resting comfortably in bed at this time. NAD noted at this time. Suction cannister emptied, 600cc's urine output at this time. Will continue to monitor for further patient needs.

## 2018-08-29 NOTE — Discharge Instructions (Signed)
As we discussed please increase your Lasix/furosemide from 40 mg daily to 80 mg daily for the next 5 days.  Please follow-up with your doctor on Monday for recheck/reevaluation.  Return to the emergency department for any trouble breathing, chest pain, or any other symptom personally concerning to yourself.

## 2018-08-29 NOTE — ED Notes (Signed)
This Rn to bedside, D/C papers reviewed with patient. Pt requesting to sit up on side of bed. This Rn assisted patient into sitting position at this time. Pt signed consent for D/C at this time, D/C papers placed into patient bag. IV removed by patient.

## 2018-08-29 NOTE — ED Notes (Signed)
Pt woken up and given cell phone to call for ride.

## 2018-08-29 NOTE — ED Notes (Signed)
Pt D/C at this time, transferred home via [facility]. This RN spoke with patient's husband during D/C, states he will be waiting outside for patient upon her arrival home. Pt's phone, purse, and shoes placed into a bag, sent with patient.

## 2018-08-29 NOTE — ED Provider Notes (Signed)
-----------------------------------------   8:39 PM on 08/29/2018 -----------------------------------------  Blood pressure 140/67, pulse 84, temperature 98.2 F (36.8 C), temperature source Oral, resp. rate (!) 22, height 5' 3"  (1.6 m), weight 86.2 kg, SpO2 99 %.  Assuming care from Dr. Kerman Passey.  In short, [name] is a 63 y.o. female with a chief complaint of Leg Swelling .  Refer to the original H&P for additional details.  The current plan of care is to discharge home if repeat troponin with no significant change.  Patient presenting for CHF secondary to poor compliance with Lasix, has responded to IV dose of Lasix here and will plan for close outpatient follow-up with doubling of Lasix dose over the next 5 days.  Repeat troponin unchanged and patient has diuresed approximately 1 L, will discharge home with close PCP follow-up.  Patient agrees with plan.    Blake Divine, MD 08/29/18 2340

## 2018-09-03 ENCOUNTER — Ambulatory Visit: Payer: Medicaid Other | Admitting: Gastroenterology

## 2018-09-20 IMAGING — CR DG CHEST 2V
1 series · 2 of 2 positions shown · non-contrast
Comparison: 06/22/2017

CLINICAL DATA: Ongoing weakness for the past year, more so today
with swelling of the feet for several months.

EXAM:
CHEST - 2 VIEW

[Series 1: w chest pa · 0.14mm/px · 2 of 2 slices shown]
[im 1/2]
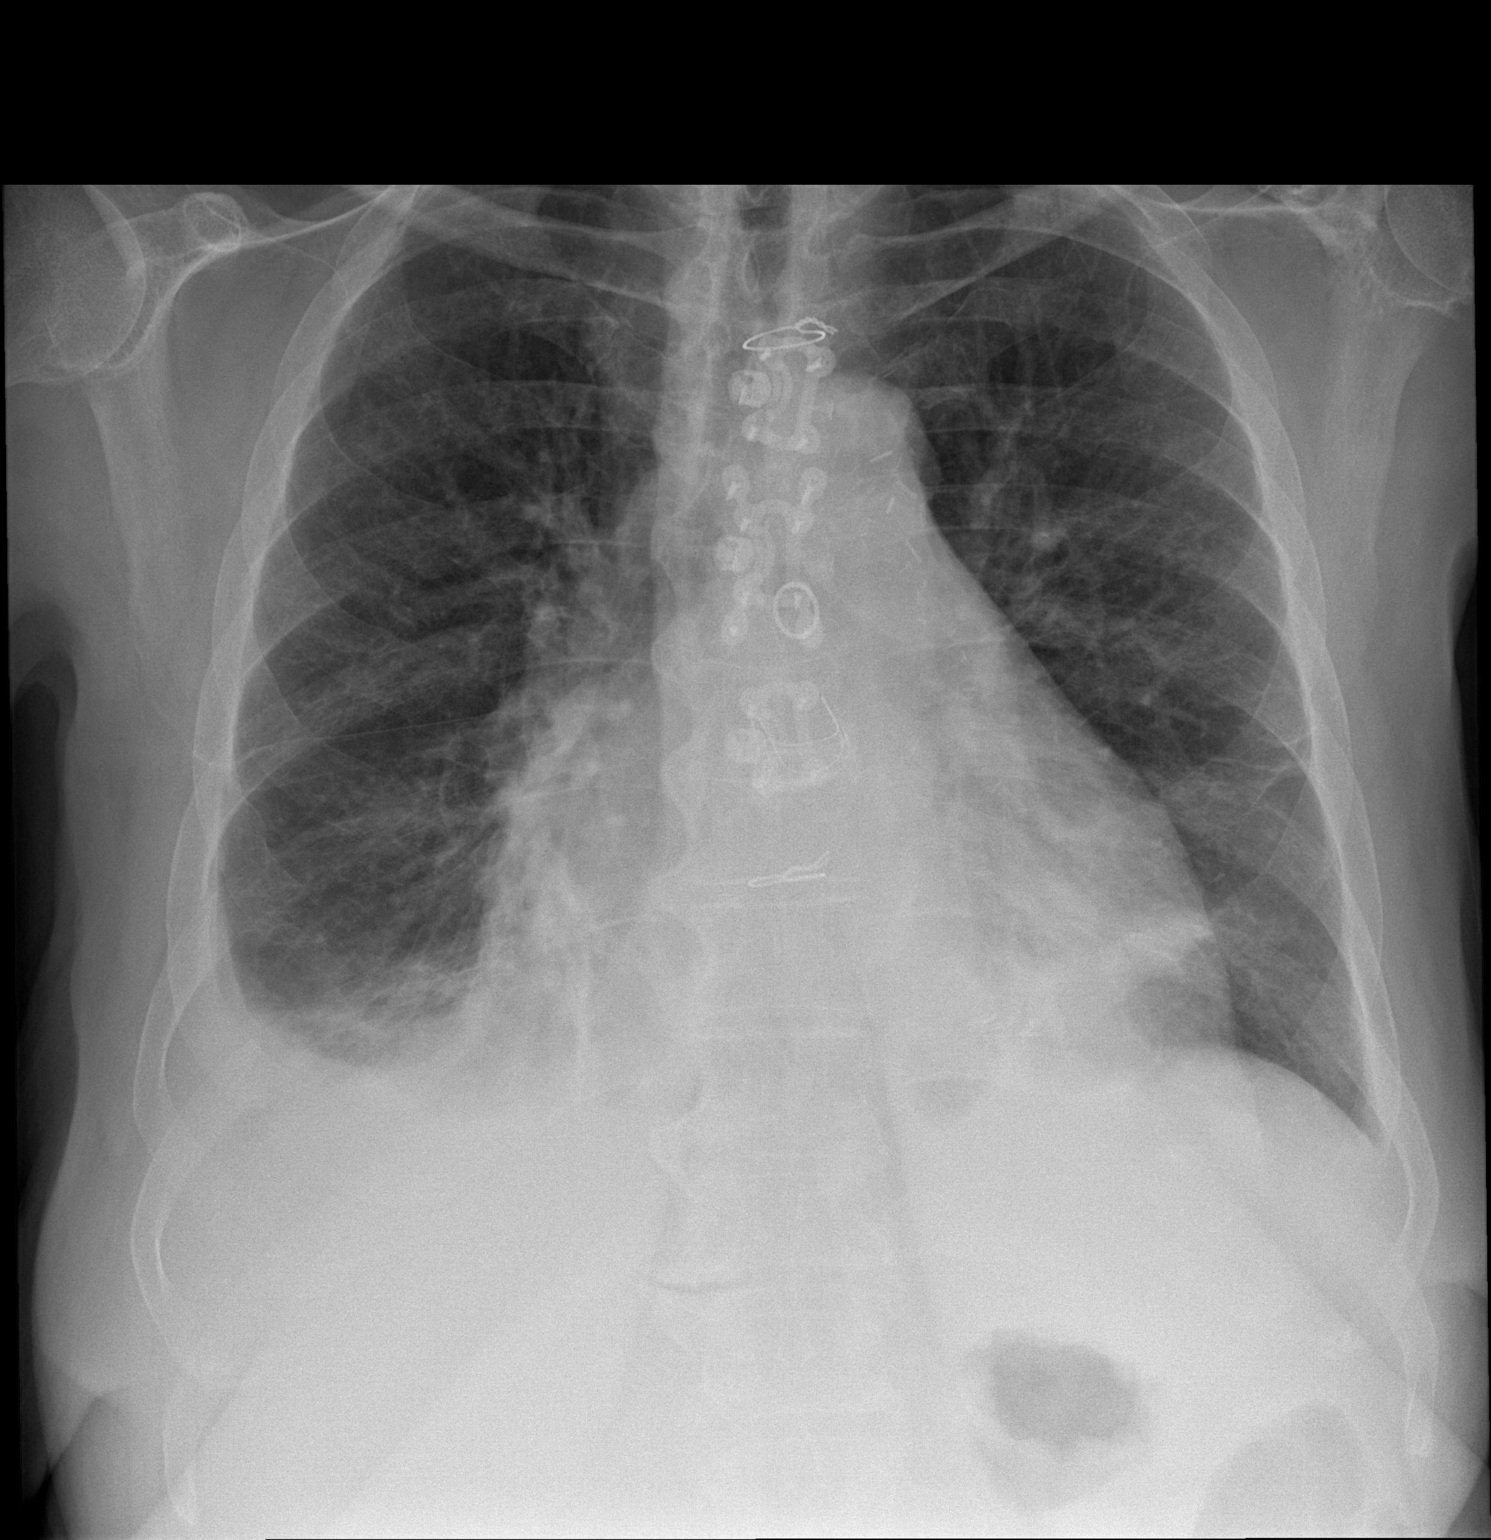
[im 2/2]
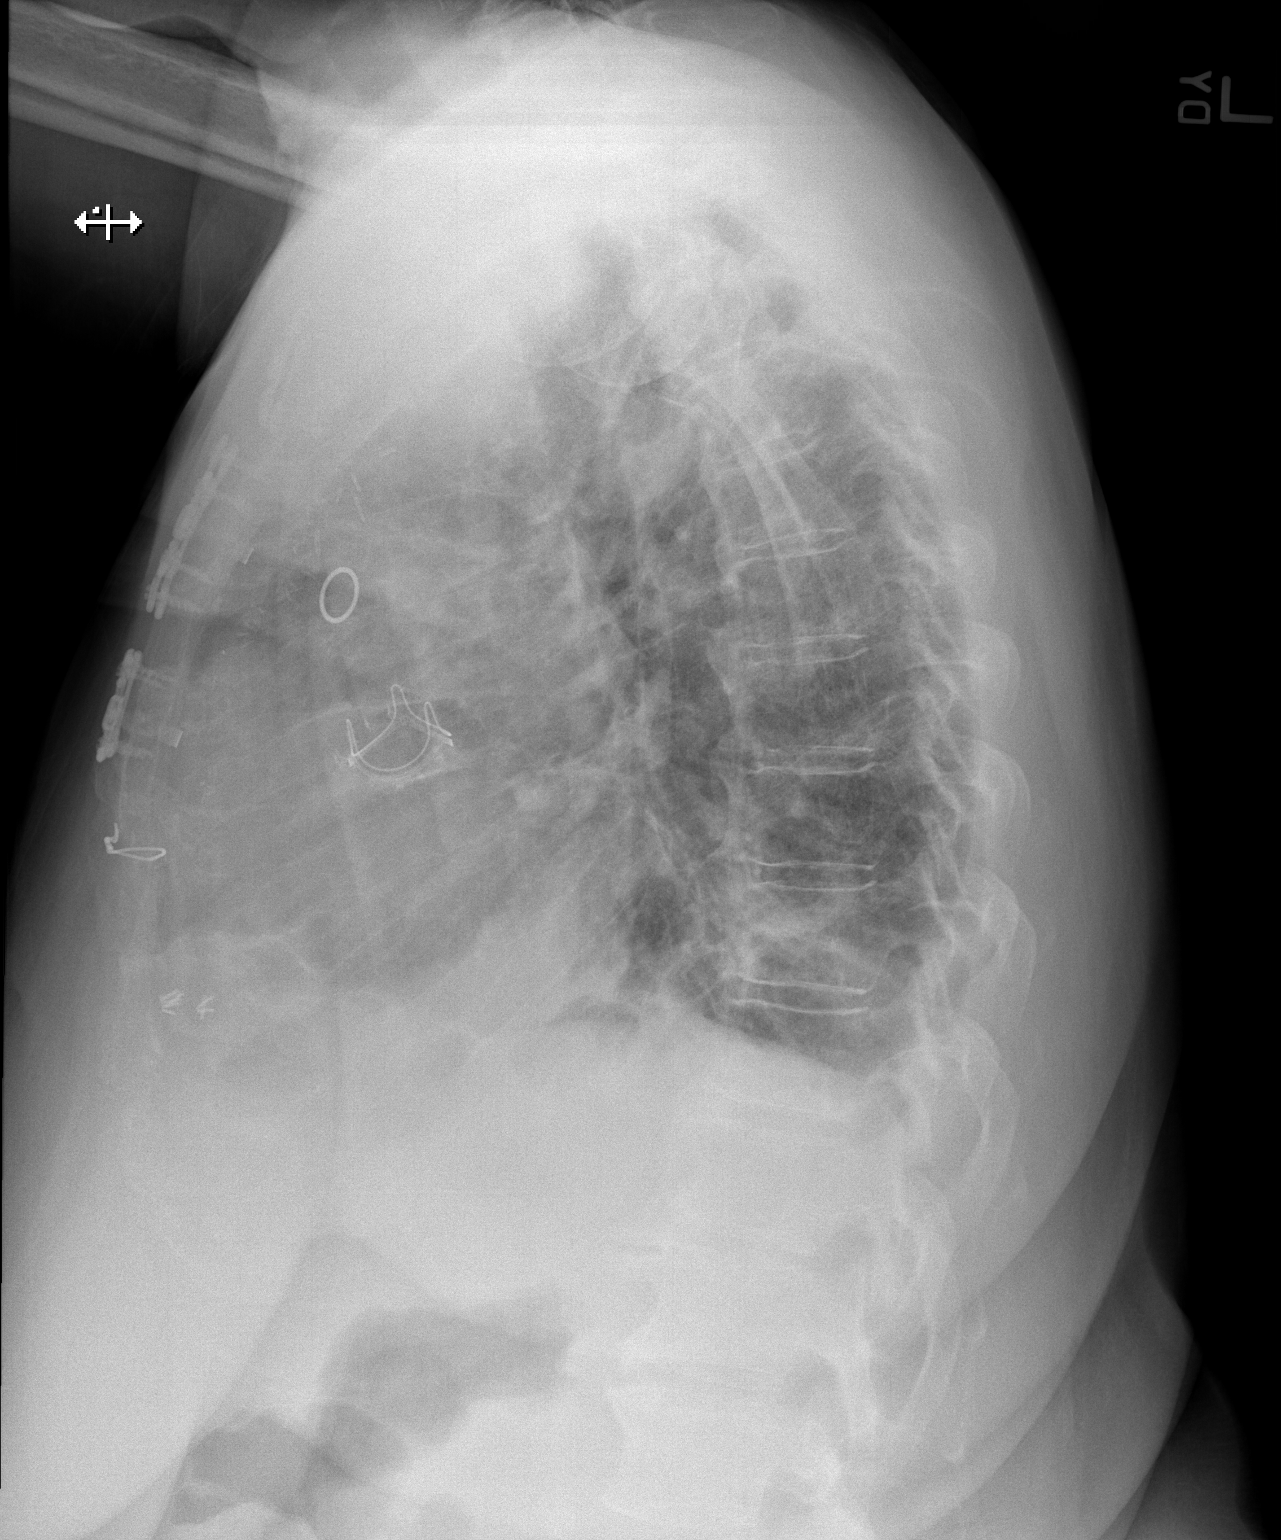

[2 of 2 positions shown; findings below may reference images not displayed]

FINDINGS: Stable cardiomegaly with aortic valvular replacement. Median
sternotomy fixation hardware and post CABG clips are present.
Streaky right infrahilar and left basilar bandlike opacities likely
represent areas of atelectasis. Mild diffuse interstitial edema with
small to moderate right effusion is noted, new since prior. No acute
osseous abnormality. Degenerative changes are present along the
dorsal spine.
IMPRESSION: 1. Stable cardiomegaly with aortic atherosclerosis.
2. Interval development of interstitial edema/CHF with small right
effusion and bibasilar atelectasis.

## 2018-10-21 IMAGING — CR DG CHEST 2V
2 series · 2 of 2 positions shown · non-contrast
Comparison: 08/26/2017

CLINICAL DATA: LEFT side chest pain, history CHF, COPD, diabetes
mellitus, smoker, NSTEMI

EXAM:
CHEST - 2 VIEW

[chest pa]
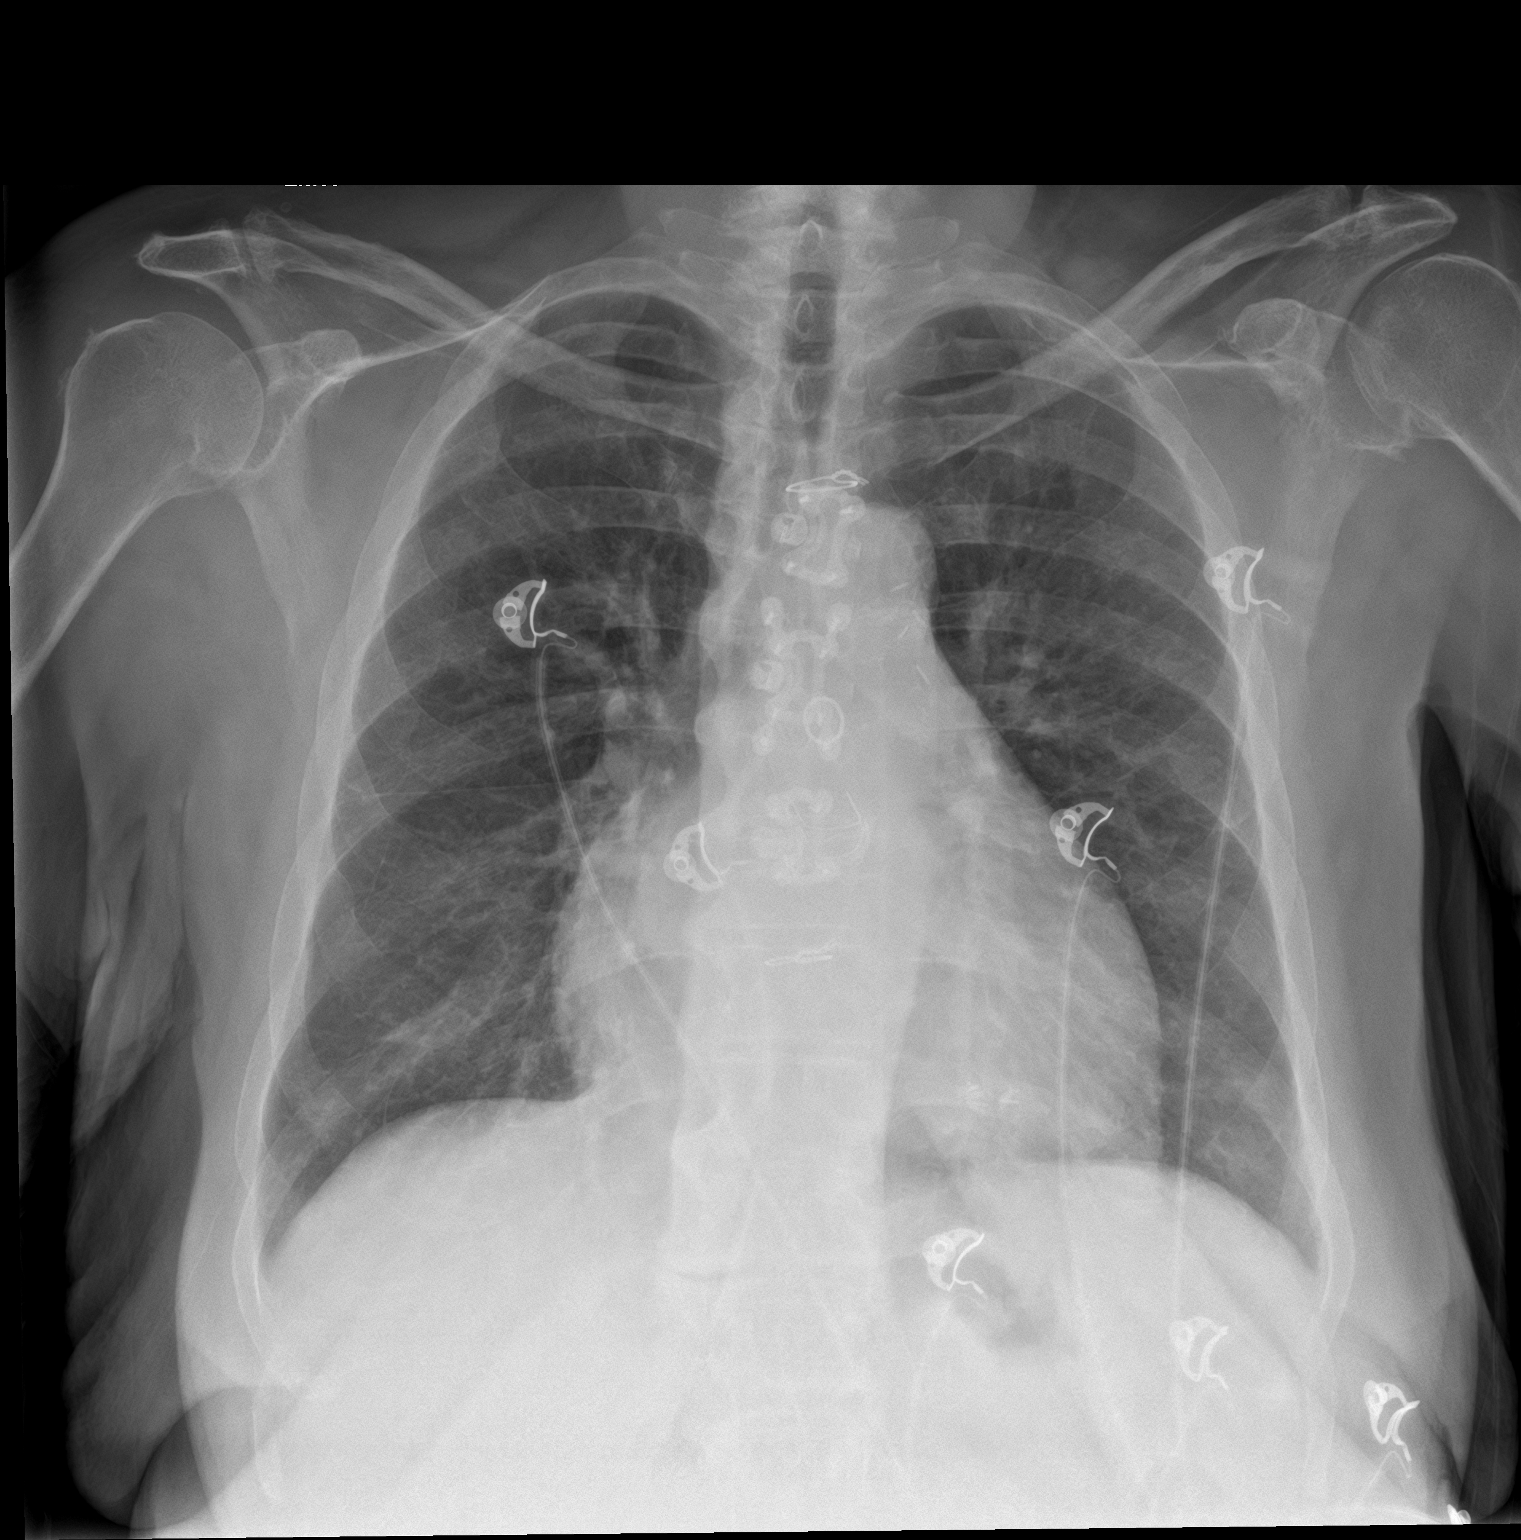

[chest lat]
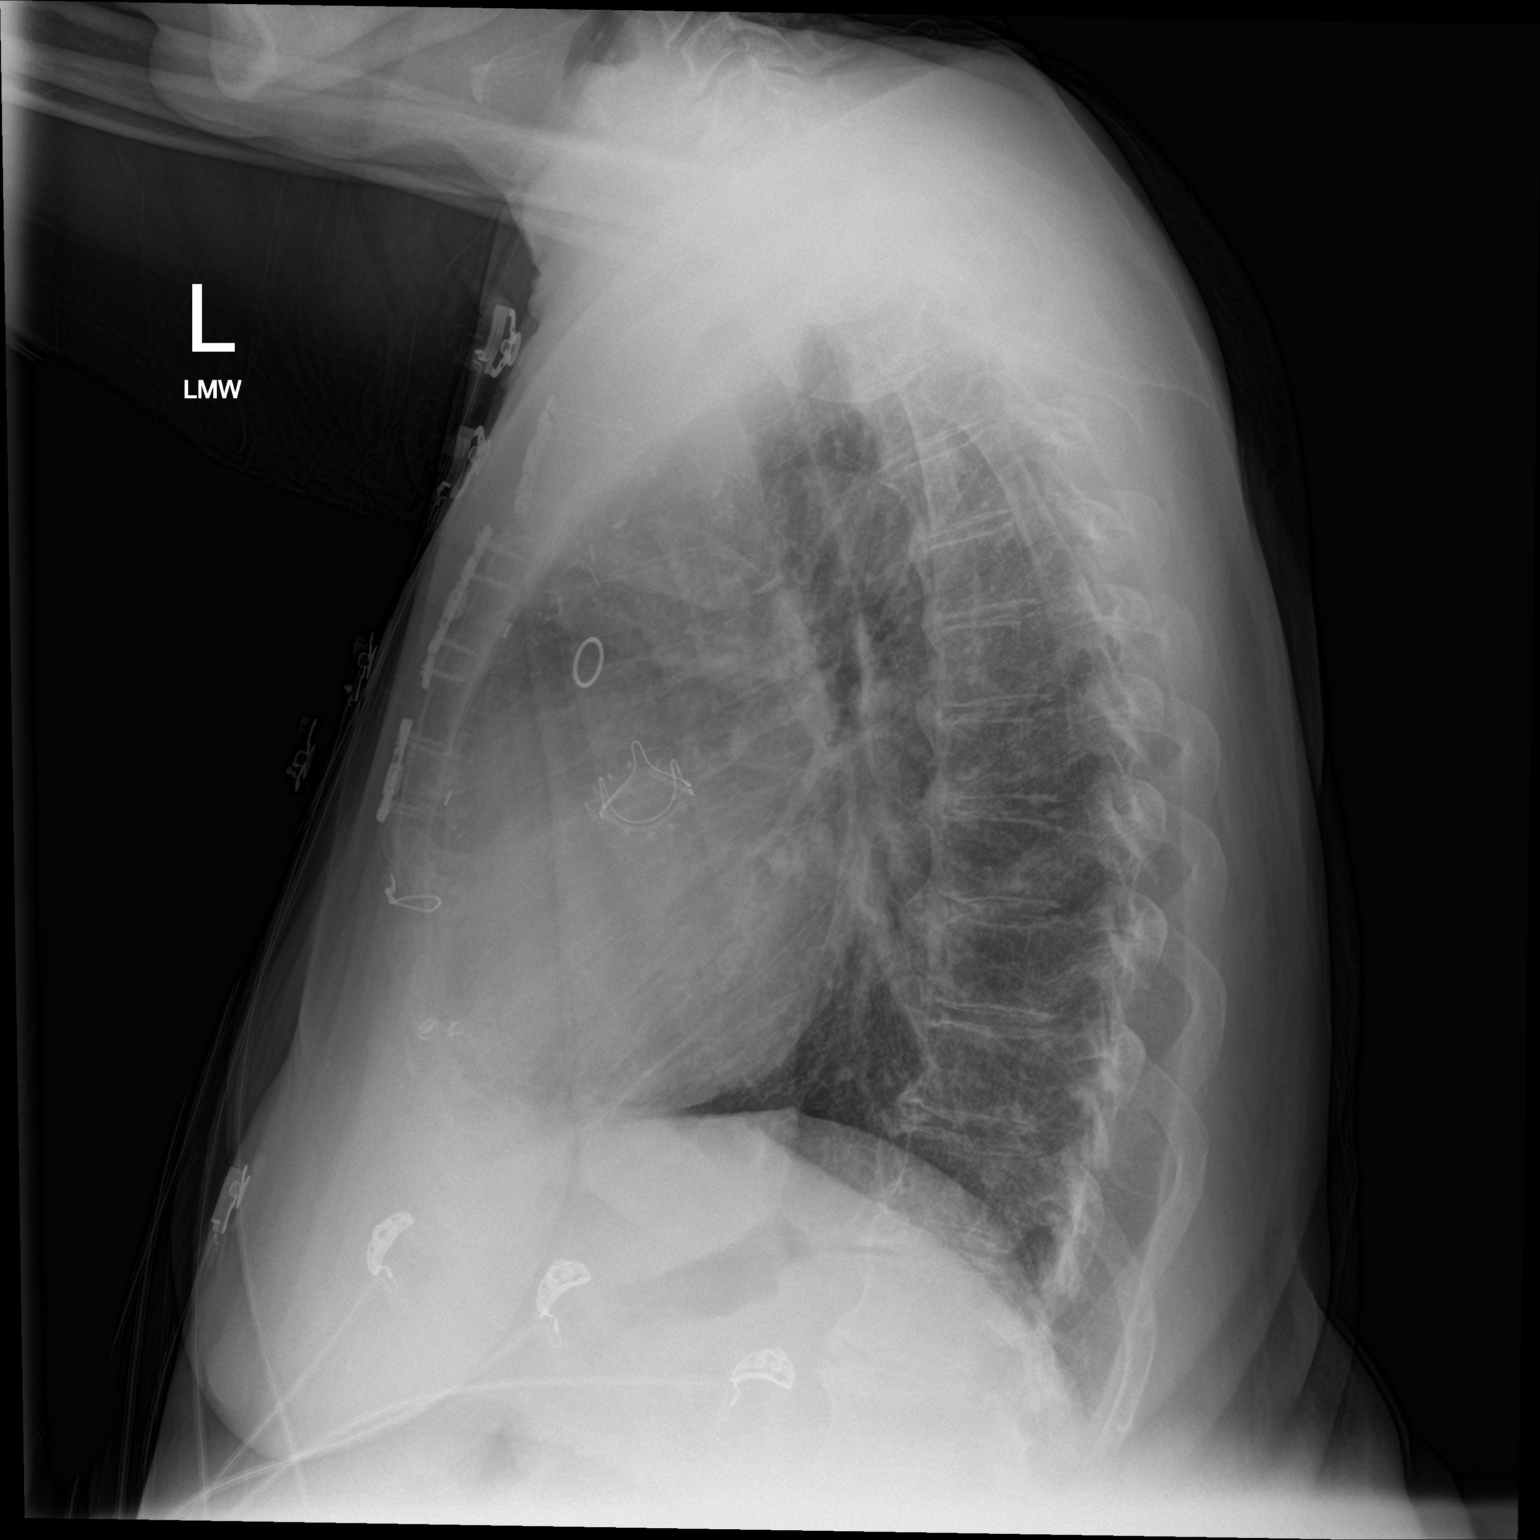

[2 of 2 positions shown; findings below may reference images not displayed]

FINDINGS: Enlargement of cardiac silhouette post CABG and AVR.

Mediastinal contours and pulmonary vascularity normal.

Lungs clear.

No acute infiltrate, pleural effusion or pneumothorax.

Bones demineralized.
IMPRESSION: Enlargement of cardiac silhouette post CABG and AVR.

No acute abnormalities.

## 2018-10-25 ENCOUNTER — Emergency Department: Payer: Medicaid Other

## 2018-10-25 ENCOUNTER — Other Ambulatory Visit: Payer: Self-pay

## 2018-10-25 ENCOUNTER — Inpatient Hospital Stay
Admission: EM | Admit: 2018-10-25 | Discharge: 2018-11-07 | DRG: 871 | Disposition: A | Discharge status: Skilled Nursing Facility | Payer: Medicaid Other | Attending: Internal Medicine | Admitting: Internal Medicine

## 2018-10-25 DIAGNOSIS — R6521 Severe sepsis with septic shock: Secondary | ICD-10-CM | POA: Diagnosis present

## 2018-10-25 DIAGNOSIS — K7460 Unspecified cirrhosis of liver: Secondary | ICD-10-CM | POA: Diagnosis present

## 2018-10-25 DIAGNOSIS — Z7951 Long term (current) use of inhaled steroids: Secondary | ICD-10-CM

## 2018-10-25 DIAGNOSIS — N10 Acute tubulo-interstitial nephritis: Secondary | ICD-10-CM | POA: Diagnosis present

## 2018-10-25 DIAGNOSIS — I5043 Acute on chronic combined systolic (congestive) and diastolic (congestive) heart failure: Secondary | ICD-10-CM | POA: Diagnosis not present

## 2018-10-25 DIAGNOSIS — D631 Anemia in chronic kidney disease: Secondary | ICD-10-CM | POA: Diagnosis present

## 2018-10-25 DIAGNOSIS — I130 Hypertensive heart and chronic kidney disease with heart failure and stage 1 through stage 4 chronic kidney disease, or unspecified chronic kidney disease: Secondary | ICD-10-CM | POA: Diagnosis present

## 2018-10-25 DIAGNOSIS — K219 Gastro-esophageal reflux disease without esophagitis: Secondary | ICD-10-CM | POA: Diagnosis present

## 2018-10-25 DIAGNOSIS — A419 Sepsis, unspecified organism: Secondary | ICD-10-CM | POA: Diagnosis present

## 2018-10-25 DIAGNOSIS — M545 Low back pain: Secondary | ICD-10-CM | POA: Diagnosis present

## 2018-10-25 DIAGNOSIS — J9600 Acute respiratory failure, unspecified whether with hypoxia or hypercapnia: Secondary | ICD-10-CM

## 2018-10-25 DIAGNOSIS — E875 Hyperkalemia: Secondary | ICD-10-CM | POA: Diagnosis not present

## 2018-10-25 DIAGNOSIS — E872 Acidosis: Secondary | ICD-10-CM | POA: Diagnosis not present

## 2018-10-25 DIAGNOSIS — Z79899 Other long term (current) drug therapy: Secondary | ICD-10-CM

## 2018-10-25 DIAGNOSIS — J449 Chronic obstructive pulmonary disease, unspecified: Secondary | ICD-10-CM | POA: Diagnosis present

## 2018-10-25 DIAGNOSIS — K567 Ileus, unspecified: Secondary | ICD-10-CM

## 2018-10-25 DIAGNOSIS — I2510 Atherosclerotic heart disease of native coronary artery without angina pectoris: Secondary | ICD-10-CM | POA: Diagnosis present

## 2018-10-25 DIAGNOSIS — D696 Thrombocytopenia, unspecified: Secondary | ICD-10-CM | POA: Diagnosis present

## 2018-10-25 DIAGNOSIS — J984 Other disorders of lung: Secondary | ICD-10-CM

## 2018-10-25 DIAGNOSIS — N179 Acute kidney failure, unspecified: Secondary | ICD-10-CM | POA: Diagnosis not present

## 2018-10-25 DIAGNOSIS — Z794 Long term (current) use of insulin: Secondary | ICD-10-CM

## 2018-10-25 DIAGNOSIS — D509 Iron deficiency anemia, unspecified: Secondary | ICD-10-CM | POA: Diagnosis present

## 2018-10-25 DIAGNOSIS — Z833 Family history of diabetes mellitus: Secondary | ICD-10-CM

## 2018-10-25 DIAGNOSIS — E785 Hyperlipidemia, unspecified: Secondary | ICD-10-CM | POA: Diagnosis present

## 2018-10-25 DIAGNOSIS — L8990 Pressure ulcer of unspecified site, unspecified stage: Secondary | ICD-10-CM | POA: Insufficient documentation

## 2018-10-25 DIAGNOSIS — Z888 Allergy status to other drugs, medicaments and biological substances status: Secondary | ICD-10-CM

## 2018-10-25 DIAGNOSIS — K5090 Crohn's disease, unspecified, without complications: Secondary | ICD-10-CM | POA: Diagnosis present

## 2018-10-25 DIAGNOSIS — Z23 Encounter for immunization: Secondary | ICD-10-CM

## 2018-10-25 DIAGNOSIS — Z20828 Contact with and (suspected) exposure to other viral communicable diseases: Secondary | ICD-10-CM | POA: Diagnosis present

## 2018-10-25 DIAGNOSIS — J9601 Acute respiratory failure with hypoxia: Secondary | ICD-10-CM | POA: Diagnosis not present

## 2018-10-25 DIAGNOSIS — Z91040 Latex allergy status: Secondary | ICD-10-CM

## 2018-10-25 DIAGNOSIS — F17210 Nicotine dependence, cigarettes, uncomplicated: Secondary | ICD-10-CM | POA: Diagnosis present

## 2018-10-25 DIAGNOSIS — I248 Other forms of acute ischemic heart disease: Secondary | ICD-10-CM | POA: Diagnosis present

## 2018-10-25 DIAGNOSIS — M549 Dorsalgia, unspecified: Secondary | ICD-10-CM | POA: Diagnosis present

## 2018-10-25 DIAGNOSIS — Z953 Presence of xenogenic heart valve: Secondary | ICD-10-CM

## 2018-10-25 DIAGNOSIS — Z803 Family history of malignant neoplasm of breast: Secondary | ICD-10-CM

## 2018-10-25 DIAGNOSIS — Z8541 Personal history of malignant neoplasm of cervix uteri: Secondary | ICD-10-CM

## 2018-10-25 DIAGNOSIS — G4733 Obstructive sleep apnea (adult) (pediatric): Secondary | ICD-10-CM | POA: Diagnosis present

## 2018-10-25 DIAGNOSIS — Z951 Presence of aortocoronary bypass graft: Secondary | ICD-10-CM

## 2018-10-25 DIAGNOSIS — I425 Other restrictive cardiomyopathy: Secondary | ICD-10-CM | POA: Diagnosis present

## 2018-10-25 DIAGNOSIS — E1122 Type 2 diabetes mellitus with diabetic chronic kidney disease: Secondary | ICD-10-CM | POA: Diagnosis present

## 2018-10-25 DIAGNOSIS — Z8249 Family history of ischemic heart disease and other diseases of the circulatory system: Secondary | ICD-10-CM

## 2018-10-25 DIAGNOSIS — I252 Old myocardial infarction: Secondary | ICD-10-CM

## 2018-10-25 DIAGNOSIS — Z8619 Personal history of other infectious and parasitic diseases: Secondary | ICD-10-CM

## 2018-10-25 DIAGNOSIS — G8929 Other chronic pain: Secondary | ICD-10-CM | POA: Diagnosis present

## 2018-10-25 DIAGNOSIS — A409 Streptococcal sepsis, unspecified: Principal | ICD-10-CM | POA: Diagnosis present

## 2018-10-25 DIAGNOSIS — E1152 Type 2 diabetes mellitus with diabetic peripheral angiopathy with gangrene: Secondary | ICD-10-CM | POA: Diagnosis present

## 2018-10-25 DIAGNOSIS — E8809 Other disorders of plasma-protein metabolism, not elsewhere classified: Secondary | ICD-10-CM | POA: Diagnosis present

## 2018-10-25 DIAGNOSIS — N1831 Chronic kidney disease, stage 3a: Secondary | ICD-10-CM | POA: Diagnosis present

## 2018-10-25 DIAGNOSIS — E1142 Type 2 diabetes mellitus with diabetic polyneuropathy: Secondary | ICD-10-CM | POA: Diagnosis present

## 2018-10-25 DIAGNOSIS — D649 Anemia, unspecified: Secondary | ICD-10-CM

## 2018-10-25 DIAGNOSIS — K5520 Angiodysplasia of colon without hemorrhage: Secondary | ICD-10-CM | POA: Diagnosis present

## 2018-10-25 LAB — CBC WITH DIFFERENTIAL/PLATELET
Abs Immature Granulocytes: 0.08 10*3/uL — ABNORMAL HIGH (ref 0.00–0.07)
Basophils Absolute: 0 10*3/uL (ref 0.0–0.1)
Basophils Relative: 0 %
Eosinophils Absolute: 0.1 10*3/uL (ref 0.0–0.5)
Eosinophils Relative: 1 %
HCT: 20.4 % — ABNORMAL LOW (ref 36.0–46.0)
Hemoglobin: 5.6 g/dL — ABNORMAL LOW (ref 12.0–15.0)
Immature Granulocytes: 1 %
Lymphocytes Relative: 2 %
Lymphs Abs: 0.2 10*3/uL — ABNORMAL LOW (ref 0.7–4.0)
MCH: 21.5 pg — ABNORMAL LOW (ref 26.0–34.0)
MCHC: 27.5 g/dL — ABNORMAL LOW (ref 30.0–36.0)
MCV: 78.5 fL — ABNORMAL LOW (ref 80.0–100.0)
Monocytes Absolute: 0.8 10*3/uL (ref 0.1–1.0)
Monocytes Relative: 6 %
Neutro Abs: 12.7 10*3/uL — ABNORMAL HIGH (ref 1.7–7.7)
Neutrophils Relative %: 90 %
Platelets: 182 10*3/uL (ref 150–400)
RBC: 2.6 MIL/uL — ABNORMAL LOW (ref 3.87–5.11)
RDW: 22 % — ABNORMAL HIGH (ref 11.5–15.5)
WBC: 14 10*3/uL — ABNORMAL HIGH (ref 4.0–10.5)
nRBC: 0 % (ref 0.0–0.2)

## 2018-10-25 LAB — GLUCOSE, CAPILLARY: Glucose-Capillary: 173 mg/dL — ABNORMAL HIGH (ref 70–99)

## 2018-10-25 LAB — BASIC METABOLIC PANEL
Anion gap: 11 (ref 5–15)
BUN: 23 mg/dL (ref 8–23)
CO2: 15 mmol/L — ABNORMAL LOW (ref 22–32)
Calcium: 8 mg/dL — ABNORMAL LOW (ref 8.9–10.3)
Chloride: 109 mmol/L (ref 98–111)
Creatinine, Ser: 1.85 mg/dL — ABNORMAL HIGH (ref 0.44–1.00)
GFR calc Af Amer: 33 mL/min — ABNORMAL LOW (ref >60)
GFR calc non Af Amer: 28 mL/min — ABNORMAL LOW (ref >60)
Glucose, Bld: 191 mg/dL — ABNORMAL HIGH (ref 70–99)
Potassium: 4.3 mmol/L (ref 3.5–5.1)
Sodium: 135 mmol/L (ref 135–145)

## 2018-10-25 LAB — LACTIC ACID, PLASMA
Lactic Acid, Venous: 6.1 mmol/L (ref 0.5–1.9)
Lactic Acid, Venous: 3.9 mmol/L (ref 0.5–1.9)

## 2018-10-25 LAB — SARS CORONAVIRUS 2 BY RT PCR (HOSPITAL ORDER, PERFORMED IN ~~LOC~~ HOSPITAL LAB): SARS Coronavirus 2: NEGATIVE

## 2018-10-25 LAB — TROPONIN I (HIGH SENSITIVITY): Troponin I (High Sensitivity): 89 ng/L — ABNORMAL HIGH (ref <18)

## 2018-10-25 MED ORDER — VANCOMYCIN HCL IN DEXTROSE 1-5 GM/200ML-% IV SOLN: 1000.0000 mg | Freq: Once | INTRAVENOUS | Status: DC

## 2018-10-25 MED ORDER — SODIUM CHLORIDE 0.9 % IV BOLUS
1000.0000 mL | Freq: Once | INTRAVENOUS | Status: AC
  Administered 2018-10-25: 1000 mL via INTRAVENOUS

## 2018-10-25 MED ORDER — VANCOMYCIN HCL 10 G IV SOLR
2000.0000 mg | Freq: Once | INTRAVENOUS | Status: AC
  Administered 2018-10-25: 2000 mg via INTRAVENOUS
  Filled 2018-10-25: qty 2000

## 2018-10-25 MED ORDER — VANCOMYCIN HCL 10 G IV SOLR
2500.0000 mg | Freq: Once | INTRAVENOUS | Status: DC
  Filled 2018-10-25: qty 2500

## 2018-10-25 MED ORDER — ACETAMINOPHEN 500 MG PO TABS
1000.0000 mg | ORAL_TABLET | Freq: Once | ORAL | Status: AC
  Administered 2018-10-25: 21:00:00 1000 mg via ORAL
  Filled 2018-10-25: qty 2

## 2018-10-25 MED ORDER — SODIUM CHLORIDE 0.9 % IV SOLN
2.0000 g | Freq: Once | INTRAVENOUS | Status: AC
  Administered 2018-10-25: 2 g via INTRAVENOUS
  Filled 2018-10-25: qty 2

## 2018-10-25 NOTE — ED Notes (Signed)
Pt placed on 2L [place] for comfort at time of arrival, RA sats 100%

## 2018-10-25 NOTE — H&P (Addendum)
[facility] NAME: [name]    MR#:  [id number]  DATE OF BIRTH:  1955/06/10  DATE OF ADMISSION:  10/25/2018  PRIMARY CARE PHYSICIAN: Smyth   REQUESTING/REFERRING PHYSICIAN: Arta Silence, MD  CHIEF COMPLAINT:   Chief Complaint  Patient presents with  . Shortness of Breath  . Fall    HISTORY OF PRESENT ILLNESS:  63 y.o. 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 Delana Meyer, and CKD to the ED with worsening shortness of breath and fall.  Patient very lethargic unable to provide history, history therefore obtained from patient's chart.  Per ED report patient arrived via EMS from home.  She states she was sitting on toilet and while attempting to get up fell on the floor.  Denies hitting head or loss of consciousness.  Patient states she started feeling very short of breath, per EMS oxygen saturation 88% on room air.  She was also complaining of abdominal pain without nausea or vomiting, hematuria, hemoptysis, hematochezia, melena or any other GI related symptoms.  Patient denies fevers or chills or recent sick contacts.  On arrival to the ED, she was febrile with temp 100.0 with blood pressure 84/55 mm Hg and pulse rate 102 beats/min. There were no focal neurological deficits; she was alert and oriented x4, and appeared lethargic.  Initial labs revealed WBC 14, hemoglobin 5.6, hematocrit 20.4, MCV 78.5, creatinine 1.85 slightly above baseline, glucose 191, troponin 89, lactic acid 3.9 COVID 19-.  Chest x-ray did not show any active cardiopulmonary process.  Hospitalist asked to admit for further management.  PAST MEDICAL HISTORY:   Past Medical History:  Diagnosis Date   . Cervical cancer ([place])   . CHF (congestive heart failure) ([place])   . Chronic anemia   . COPD (chronic obstructive pulmonary disease) ([place])   . Diabetes mellitus without complication ([employer])   . Gastric ulcer   . NSTEMI (non-ST elevated myocardial infarction) ([place])   . Upper GI bleed     PAST SURGICAL HISTORY:   Past Surgical History:  Procedure Laterality Date  . AMPUTATION Left 06/24/2018   Procedure: AMPUTATION GREAT TOE;  Surgeon: Samara Deist, DPM;  Location: [facility] ORS;  Service: Podiatry;  Laterality: Left;  . APPENDECTOMY    . CARDIAC CATHETERIZATION    . CHOLECYSTECTOMY    . COLONOSCOPY N/A 09/28/2017   Procedure: COLONOSCOPY;  Surgeon: Toledo, Benay Pike, MD;  Location: [facility] ENDOSCOPY;  Service: Gastroenterology;  Laterality: N/A;  . COLONOSCOPY WITH PROPOFOL N/A 08/21/2018   Procedure: COLONOSCOPY WITH PROPOFOL;  Surgeon: Lin Landsman, MD;  Location: [facility] ENDOSCOPY;  Service: Gastroenterology;  Laterality: N/A;  . ENTEROSCOPY Left 11/06/2017   Procedure: ENTEROSCOPY;  Surgeon: Jonathon Bellows, MD;  Location: [facility] ENDOSCOPY;  Service: Gastroenterology;  Laterality: Left;  . ENTEROSCOPY N/A 06/22/2018   Procedure: ENTEROSCOPY;  Surgeon: Lin Landsman, MD;  Location: [facility] ENDOSCOPY;  Service: Gastroenterology;  Laterality: N/A;  . ENTEROSCOPY N/A 08/18/2018   Procedure: ENTEROSCOPY;  Surgeon: Jonathon Bellows, MD;  Location: [facility] ENDOSCOPY;  Service: Gastroenterology;  Laterality: N/A;  . ESOPHAGOGASTRODUODENOSCOPY N/A 12/19/2016   Procedure: ESOPHAGOGASTRODUODENOSCOPY (EGD);  Surgeon: Lin Landsman, MD;  Location[facility] END[facility]COPY;  Service: Gastroenterology;  Laterality: N/A;  . ESOPHAGOGASTRODUODENOSCOPY N/A 09/28/2017   Procedure: ESOPHAGOGASTRODUODENOSCOPY (EGD);  Surgeon: Toledo, Benay Pike, MD;  Location: [facility] ENDOSCOPY;  Service: Gastroenterology;  Laterality: N/A;  . ESOPHAGOGASTRODUODENOSCOPY (EGD) WITH PROPOFOL N/A 08/08/2017   Procedure: ESOPHAGOGASTRODUODENOSCOPY (EGD)  WITH PROPOFOL;  Surgeon: [name], MD;  Location: [facility] ENDOSCOPY;  Service: Endoscopy;  Laterality: N/A;  . heart [place] in [place]    . LEFT HEART CATH AND CORONARY ANGIOGRAPHY N/A 12/23/2016   Procedure: LEFT HEART CATH AND CORONARY ANGIOGRAPHY;  Surgeon: Teodoro Spray, MD;  Location: [place] CV LAB;  Service: Cardiovascular;  Laterality: N/A;  . LOWER EXTREMITY ANGIOGRAPHY Left 06/26/2018   Procedure: Lower Extremity Angiography;  Surgeon: Katha Cabal, MD;  Location: [place] CV LAB;  Service: Cardiovascular;  Laterality: Left;    SOCIAL HISTORY:   Social History   Tobacco Use  . Smoking status: Current Some Day Smoker    Packs/day: 0.25    Types: Cigarettes  . Smokeless tobacco: Never Used  Substance Use Topics  . Alcohol use: No    FAMILY HISTORY:   Family History  Problem Relation Age of Onset  . Hypertension Mother   . Diabetes Mellitus II Mother   . Breast cancer Mother   . Heart disease Father   . Cancer Sister     DRUG ALLERGIES:   Allergies  Allergen Reactions  . Latex Anaphylaxis and Rash  . Gabapentin Nausea And Vomiting and Other (See Comments)    REVIEW OF SYSTEMS:   Review of Systems  Constitutional: Positive for malaise/fatigue. Negative for chills, fever and weight loss.  HENT: Negative for congestion, hearing loss and sore throat.   Eyes: Negative for blurred vision and double vision.  Respiratory: Positive for shortness of breath. Negative for cough and wheezing.   Cardiovascular: Positive for leg swelling. Negative for chest pain, palpitations and orthopnea.  Gastrointestinal: Positive for abdominal pain. Negative for diarrhea, nausea and vomiting.  Genitourinary: Negative for dysuria and urgency.  Musculoskeletal: Negative for myalgias.  Skin: Negative for rash.  Neurological: Negative for dizziness, sensory change, speech change, focal weakness and headaches.  Psychiatric/Behavioral: Negative for depression.    MEDICATIONS AT HOME:   Prior to Admission medications   Medication Sig Start Date End Date Taking? Authorizing Provider  atorvastatin (LIPITOR) 40 MG tablet Take 40 mg by mouth daily.   Yes [provider]  carvedilol (COREG) 3.125 MG tablet Take 1 tablet (3.125 mg total) by mouth 2 (two) times daily with a meal. 08/21/18  Yes Ojie, Jude, MD  ferrous sulfate 325 (65 FE) MG tablet Take 1 tablet (325 mg total) by mouth 2 (two) times daily with a meal. 08/21/18  Yes Ojie, Jude, MD  fluticasone (FLONASE) 50 MCG/ACT nasal spray Place 2 sprays into both nostrils daily.   Yes [provider]  folic acid (FOLVITE) 1 MG tablet Take 1 tablet (1 mg total) by mouth daily. 08/21/18  Yes Ojie, Jude, MD  furosemide (LASIX) 40 MG tablet Take 1 tablet (40 mg total) by mouth daily. Patient taking differently: Take 40 mg by mouth daily as needed. Take if weight is increased (Per instructions) 01/16/18 01/16/19 Yes Gladstone Lighter, MD  Insulin Glargine (LANTUS SOLOSTAR) 100 UNIT/ML Solostar Pen Inject 15 Units into the skin daily. Patient taking differently: Inject 16 Units into the skin at bedtime.  08/29/17  Yes Dustin Flock, MD  lisinopril (ZESTRIL) 5 MG tablet Take 5 mg by mouth daily.   Yes [provider]  omeprazole (PRILOSEC) 40 MG capsule Take 1 capsule (40 mg total) by mouth  2 (two) times a day. 08/15/18 10/25/18 Yes Vanga, Tally Due, MD  pantoprazole (PROTONIX) 40 MG tablet Take 40 mg by mouth 2 (two) times daily.   Yes [provider]  pregabalin (LYRICA) 150 MG capsule Take 150 mg by mouth 2 (two) times daily. 08/03/18  Yes [provider]  PROAIR HFA 108 (90 Base) MCG/ACT inhaler Inhale 1-2 puffs into the lungs every 4 (four) hours as needed. 10/10/17  Yes [provider]  spironolactone (ALDACTONE) 25 MG tablet Take 12.5 mg by mouth daily.   Yes [provider]  SYMBICORT 160-4.5 MCG/ACT inhaler Inhale 2 puffs into the lungs 2 (two) times  daily. 10/10/17  Yes [provider]  torsemide (DEMADEX) 20 MG tablet Take 20 mg by mouth daily. Take 2 tablets if weight is greater than 185 lbs. NO tablets if weight is less than 177 lbs.   Yes [provider]  Multiple Vitamin (MULTIVITAMIN WITH MINERALS) TABS tablet Take 1 tablet by mouth daily. Patient not taking: Reported on 10/25/2018 06/28/18   Lang Snow, NP  thiamine 100 MG tablet Take 1 tablet (100 mg total) by mouth daily. Patient not taking: Reported on 10/25/2018 06/28/18   Lang Snow, NP      VITAL SIGNS:  Blood pressure (!) 108/48, pulse (!) 102, temperature 100 F (37.8 C), temperature source Oral, resp. rate (!) 22, height 5' 8"  (1.727 m), weight 125.1 kg, SpO2 98 %.  PHYSICAL EXAMINATION:   Physical Exam  GENERAL:  63 y.o.-year-old patient lying in the bed with no acute distress.  EYES: Pupils equal, round, reactive to light and accommodation. No scleral icterus. Extraocular muscles intact.  HEENT: Head atraumatic, normocephalic. Oropharynx and nasopharynx clear.  NECK:  Supple, no jugular venous distention. No thyroid enlargement, no tenderness.  LUNGS: Normal breath sounds bilaterally, no wheezing, rales,rhonchi or crepitation. No use of accessory muscles of respiration.  CARDIOVASCULAR: S1, S2 normal. No murmurs, rubs, or gallops.  ABDOMEN: Soft, nontender, nondistended. Bowel sounds present. No organomegaly or mass.  EXTREMITIES: Bilateral pitting edema of lower extremities, cyanosis, or clubbing.  NEUROLOGIC: Cranial nerves II through XII are intact. Muscle strength 5/5 in all extremities. Sensation intact. Gait not checked.  PSYCHIATRIC: The patient is alert and oriented x 3.  SKIN: s/p post Amputation left great toe MTPJ  DATA REVIEWED:  LABORATORY PANEL:   CBC Recent Labs  Lab 10/25/18 2038  WBC 14.0*  HGB 5.6*  HCT 20.4*  PLT 182    ------------------------------------------------------------------------------------------------------------------  Chemistries  Recent Labs  Lab 10/25/18 2038  NA 135  K 4.3  CL 109  CO2 15*  GLUCOSE 191*  BUN 23  CREATININE 1.85*  CALCIUM 8.0*   ------------------------------------------------------------------------------------------------------------------  Cardiac Enzymes No results for input(s): TROPONINI in the last 168 hours. ------------------------------------------------------------------------------------------------------------------  RADIOLOGY:  Dg Chest Portable 1 View  Result Date: 10/25/2018 CLINICAL DATA:  Fall EXAM: PORTABLE CHEST 1 VIEW COMPARISON:  08/29/2018 FINDINGS: Mild cardiomegaly with prosthetic valve and left atrial closure device. No pulmonary edema or focal airspace consolidation. No pleural effusion or pneumothorax. IMPRESSION: No active cardiopulmonary disease. Electronically Signed   By: Ulyses Jarred M.D.   On: 10/25/2018 21:14    EKG:  EKG: unchanged from previous tracings, sinus tachycardia. Vent. rate 109 BPM PR interval * ms QRS duration 125 ms QT/QTc 344/464 ms P-R-T axes 78 -54 72 IMPRESSION AND PLAN:   63 y.o. 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 Delana Meyer, and CKD to the ED with worsening shortness of breath and fall.  1. Sepsis - Patient meets SIRS criteria,Heart Rate 102 beats/minute, Respiratory Rate 23 breaths/minute,Temperature 100.0 with elevated white count and lactic acid. No clear source of infection - Admit to MedSurg unit - Chest x-ray shows no active cardiopulmonary disease - Covid negative - Check procalcitonin and lipase - CT abdomen and pelvis to eval for  abdominal/flank pain evaluate for possible pyelonephritis - UA and culture pending - Blood cultures pending - Start Empiric abx with cefepime, vancomycin and Flagyl  2.  Acute on chronic iron deficiency anemia -secondary to chronic blood loss from gastric and small bowel AVMs -s/p EGD (06/22/18) with oozing gastric ulcers treated with bipolar cautery, colonoscopy, capsule endoscopy as well as small bowel enteroscopy (06/2018) - Current hemoglobin 5.6 status post PRBC - IVF resuscitation to maintain MAP>65 - H&H monitoring q6h -Transfuse PRN Hgb<8 - Pantoprazole 42m IV x1 then gtt 833mhr - NPO for now - GI Consult  - Hold NSAIDs, steroids, ASA - Will need IV iron infusion  3. Elevated troponin -likely demand ischemia - Continue to trend troponin  4. DM: sugars have been well-controlled on current regimen  - hemoglobin A1c 1 6/5 was 5.9 - sliding scale insulin coverage in addition to Lantus - ADA 2100 calorie diet  - FS BS qac and qhs   5. Chronic congestive heart failure with reduced EF - stable Echocardiogram with moderate LVH and EF at 40 to45% and diffuse hypokinesis noted. Patient has had bioprosthesis for aortic valve - Hold Lasix, Demadex, Coreg in the setting of hypotension - Low salt diet   6. COPD - no evidence of exacerbation.  - Supplemental O2, goal sat 88-92% - Duonebs q6hours - Continue home inhalers  7. Acute on chronic Kidney Disease -BUN/Cr slightly elevated above baseline - Hold nephrotoxins - Continue to monitor renal function  8. DVT prophylaxis - Hold anti-coagulation due to GI bleed    All the records are reviewed and case discussed with ED provider. Management plans discussed with the patient, family and they are in agreement.  CODE STATUS: FULL  TOTAL TIME TAKING CARE OF THIS PATIENT: 50 minutes.    on 10/25/2018 at 11:50 PM   ElRufina FalcoDNP, FNP-BC [facility] Nurse Practitioner Between 7am to 6pm - Pager - [phone] After 6pm go to [email or web address] - password [email or web address]  Office  [phone]: Primary care physician; [place]

## 2018-10-25 NOTE — Progress Notes (Signed)
PHARMACY -  BRIEF ANTIBIOTIC NOTE   Pharmacy has received consult(s) for Vancomycin, Cefepime  from an ED provider.  The patient's profile has been reviewed for ht/wt/allergies/indication/available labs.    One time order(s) placed for Vancomycin 2500 mg IV X 1 and Cefepime 2 gm IV X 1 .   Further antibiotics/pharmacy consults should be ordered by admitting physician if indicated.                       Thank you, Alila Sotero D 10/25/2018  10:14 PM

## 2018-10-25 NOTE — ED Provider Notes (Signed)
[facility] Emergency Department Provider Note ____________________________________________   First MD Initiated Contact with Patient 10/25/18 2032     (approximate)  I have reviewed the triage vital signs and the nursing notes.   HISTORY  Chief Complaint Shortness of Breath and Fall    HPI [name] is a 63 y.o. female with PMH as noted below who presents with generalized weakness acute onset this evening, as well as with shortness of breath over the last day.  The patient states that this evening she was sitting on the toilet and when she tried to get up, she fell and was unable to get up.  She denies passing out.  She states that she has been feeling weak for a few days and gradually became increasingly short of breath.  She also has had a cough.  She denies chest pain, vomiting, diarrhea, or urinary symptoms.  Past Medical History:  Diagnosis Date  . Cervical cancer ([place])   . CHF (congestive heart failure) ([place])   . Chronic anemia   . COPD (chronic obstructive pulmonary disease) ([place])   . Diabetes mellitus without complication ([place])   . Gastric ulcer   . NSTEMI (non-ST elevated myocardial infarction) ([place])   . Upper GI bleed     Patient Active Problem List   Diagnosis Date Noted  . Anxiety 08/15/2018  . Chronic back pain 08/15/2018  . GERD (gastroesophageal reflux disease) 08/15/2018  . History of carpal tunnel syndrome 08/15/2018  . HNP (herniated nucleus pulposus), lumbar 08/15/2018  . Hyperlipidemia 08/15/2018  . Hypertension 08/15/2018  . Insomnia 08/15/2018  . Left ventricular hypertrophy 08/15/2018  . Obesity 08/15/2018  . Sleep apnea 08/15/2018  . Severe anemia 06/20/2018  . Diabetic polyneuropathy associated with type 2 diabetes mellitus ([place]) 02/23/2018  . Moderate tricuspid regurgitation 02/18/2018  . Acute on chronic diastolic CHF (congestive heart failure) ([place]) 02/08/2018  . 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 Depression 02/05/2018  . Generalized abdominal pain 02/05/2018  . CHF (congestive heart failure) ([place]) 01/13/2018  . B12 deficiency 12/07/2017  . Iron deficiency anemia due to chronic blood loss 12/07/2017  . Folate deficiency 12/07/2017  . GIB (gastrointestinal bleeding) 11/03/2017  . COPD (chronic obstructive pulmonary disease) ([place]) 09/26/2017  . Symptomatic anemia 09/26/2017  . Acute on chronic systolic CHF (congestive heart failure) ([place]) 08/27/2017  . Anemia   . Weakness   . GI bleed 08/07/2017  . Chronic systolic heart failure ([place]) 12/28/2016  . Diabetes ([place]) 12/28/2016  . Tobacco use 12/28/2016  . NSTEMI (non-ST elevated myocardial infarction) ([place])   . Sepsis ([place]) 12/18/2016  . CHF exacerbation ([place]) 12/04/2015  . PNA (pneumonia) 10/28/2015  . COPD exacerbation ([place]) 08/29/2015  . Chronic hepatitis C without hepatic coma ([place]) 05/21/2014  . CAD (coronary artery disease) 07/19/2013  . Right arm pain 06/26/2013  . Cervical pain (neck) 03/28/2013  . Radiculopathy affecting upper extremity 03/28/2013  . Impaired renal function 12/21/2012  . Abnormal mammogram 12/20/2012  . Diabetes mellitus type 2, uncontrolled ([place]) 12/20/2012    Past Surgical History:  Procedure Laterality Date  . AMPUTATION Left 06/24/2018   Procedure: AMPUTATION GREAT TOE;  Surgeon: Samara Deist, DPM;  Location: [facility] ORS;  Service: Podiatry;  Laterality: Left;  . APPENDECTOMY    . CARDIAC CATHETERIZATION    . CHOLECYSTECTOMY    . COLONOSCOPY N/A 09/28/2017   Procedure: COLONOSCOPY;  Surgeon: Toledo, Benay Pike, MD;  Location: [facility] ENDOSCOPY;  Service: Gastroenterology;  Laterality: N/A;  .  COLONOSCOPY WITH PROPOFOL N/A 08/21/2018   Procedure: COLONOSCOPY WITH PROPOFOL;  Surgeon: Lin Landsman, MD;  Location: [facility] ENDOSCOPY;  Service: Gastroenterology;  Laterality: N/A;  . ENTEROSCOPY Left 11/06/2017   Procedure: ENTEROSCOPY;  Surgeon: Jonathon Bellows, MD;  Location[facility] ENDOSCOPY;  Service:  Gastroenterology;  Laterality: Left;  . ENTEROSCOPY N/A 06/22/2018   Procedure: ENTEROSCOPY;  Surgeon: Lin Landsman, MD;  Location: [facility] ENDOSCOPY;  Service: Gastroenterology;  Laterality: N/A;  . ENTEROSCOPY N/A 08/18/2018   Procedure: ENTEROSCOPY;  Surgeon: Jonathon Bellows, MD;  Location: [facility] ENDOSCOPY;  Service: Gastroenterology;  Laterality: N/A;  . ESOPHAGOGASTRODUODENOSCOPY N/A 12/19/2016   Procedure: ESOPHAGOGASTRODUODENOSCOPY (EGD);  Surgeon: Lin Landsman, MD;  Location: [facility] ENDOSCOPY;  Service: Gastroenterology;  Laterality: N/A;  . ESOPHAGOGASTRODUODENOSCOPY N/A 09/28/2017   Procedure: ESOPHAGOGASTRODUODENOSCOPY (EGD);  Surgeon: Toledo, Benay Pike, MD;  Location: [facility] ENDOSCOPY;  Service: Gastroenterology;  Laterality: N/A;  . ESOPHAGOGASTRODUODENOSCOPY (EGD) WITH PROPOFOL N/A 08/08/2017   Procedure: ESOPHAGOGASTRODUODENOSCOPY (EGD) WITH PROPOFOL;  Surgeon: [name], MD;  Location: [facility] ENDOSCOPY;  Service: Endoscopy;  Laterality: N/A;  . [place]    . LEFT HEART CATH AND CORONARY ANGIOGRAPHY N/A 12/23/2016   Procedure: LEFT HEART CATH AND CORONARY ANGIOGRAPHY;  Surgeon: Teodoro Spray, MD;  Location: [facility] CV LAB;  Service: Cardiovascular;  Laterality: N/A;  . LOWER EXTREMITY ANGIOGRAPHY Left 06/26/2018   Procedure: Lower Extremity Angiography;  Surgeon: Katha Cabal, MD;  Location: [facility] CV LAB;  Service: Cardiovascular;  Laterality: Left;    Prior to Admission medications   Medication Sig Start Date End Date Taking? Authorizing Provider  atorvastatin (LIPITOR) 40 MG tablet Take 40 mg by mouth daily.   Yes [provider]  carvedilol (COREG) 3.125 MG tablet Take 1 tablet (3.125 mg total) by mouth 2 (two) times daily with a meal. 08/21/18  Yes Ojie, Jude, MD  ferrous sulfate 325 (65 FE) MG tablet Take 1 tablet (325 mg total) by mouth 2 (two) times daily with a meal. 08/21/18  Yes Ojie, Jude, MD  fluticasone (FLONASE) 50 MCG/ACT  nasal spray Place 2 sprays into both nostrils daily.   Yes [provider]  folic acid (FOLVITE) 1 MG tablet Take 1 tablet (1 mg total) by mouth daily. 08/21/18  Yes Ojie, Jude, MD  furosemide (LASIX) 40 MG tablet Take 1 tablet (40 mg total) by mouth daily. Patient taking differently: Take 40 mg by mouth daily as needed. Take if weight is increased (Per instructions) 01/16/18 01/16/19 Yes Gladstone Lighter, MD  Insulin Glargine (LANTUS SOLOSTAR) 100 UNIT/ML Solostar Pen Inject 15 Units into the skin daily. Patient taking differently: Inject 16 Units into the skin at bedtime.  08/29/17  Yes Dustin Flock, MD  lisinopril (ZESTRIL) 5 MG tablet Take 5 mg by mouth daily.   Yes [provider]  omeprazole (PRILOSEC) 40 MG capsule Take 1 capsule (40 mg total) by mouth 2 (two) times a day. 08/15/18 10/25/18 Yes Vanga, Tally Due, MD  pantoprazole (PROTONIX) 40 MG tablet Take 40 mg by mouth 2 (two) times daily.   Yes [provider]  pregabalin (LYRICA) 150 MG capsule Take 150 mg by mouth 2 (two) times daily. 08/03/18  Yes [provider]  PROAIR HFA 108 (90 Base) MCG/ACT inhaler Inhale 1-2 puffs into the lungs every 4 (four) hours as needed. 10/10/17  Yes [provider]  spironolactone (ALDACTONE) 25 MG tablet Take 12.5 mg by mouth daily.   Yes [provider]  [facility]  160-4.5 MCG/ACT inhaler Inhale 2 puffs into the lungs 2 (two) times daily. 10/10/17  Yes [provider]  torsemide (DEMADEX) 20 MG tablet Take 20 mg by mouth daily. Take 2 tablets if weight is greater than 185 lbs. NO tablets if weight is less than 177 lbs.   Yes [provider]  Multiple Vitamin (MULTIVITAMIN WITH MINERALS) TABS tablet Take 1 tablet by mouth daily. Patient not taking: Reported on 10/25/2018 06/28/18   Lang Snow, NP  thiamine 100 MG tablet Take 1 tablet (100 mg total) by mouth daily. Patient not taking: Reported on 10/25/2018 06/28/18   Lang Snow, NP    Allergies Latex and Gabapentin  Family History  Problem Relation Age of Onset  . Hypertension Mother   . Diabetes Mellitus II Mother   . Breast cancer Mother   . Heart disease Father   . Cancer Sister     Social History Social History   Tobacco Use  . Smoking status: Current Some Day Smoker    Packs/day: 0.25    Types: Cigarettes  . Smokeless tobacco: Never Used  Substance Use Topics  . Alcohol use: No  . Drug use: No    Review of Systems  Constitutional: Positive for fever. Eyes: No redness. ENT: No sore throat. Cardiovascular: Denies chest pain. Respiratory: Positive for shortness of breath. Gastrointestinal: No vomiting or diarrhea.  Genitourinary: Negative for dysuria.  Musculoskeletal: Negative for back pain. Skin: Negative for rash. Neurological: Negative for headache.   ____________________________________________   PHYSICAL EXAM:  VITAL SIGNS: ED Triage Vitals  Enc Vitals Group     BP 10/25/18 2029 (!) 124/59     Pulse Rate 10/25/18 2024 (!) 110     Resp 10/25/18 2024 (!) 26     Temp 10/25/18 2024 100 F (37.8 C)     Temp Source 10/25/18 2024 Oral     SpO2 10/25/18 2024 100 %     Weight 10/25/18 2022 (!) 560 lb (254 kg)     Height 10/25/18 2022 5' 8"  (1.727 m)     Head Circumference --      Peak Flow --      Pain Score 10/25/18 2022 5     Pain Loc --      Pain Edu? --      Excl. in [place]? --     Constitutional: Alert and oriented.  Weak appearing but in no acute distress. Eyes: Conjunctivae are normal.  Head: Atraumatic. Nose: No congestion/rhinnorhea. Mouth/Throat: Mucous membranes are somewhat dry.   Neck: Normal range of motion.  Cardiovascular: Borderline tachycardic, regular rhythm. Grossly normal heart sounds.  Good peripheral circulation. Respiratory: Increased respiratory effort.  No retractions.  Decreased breath sounds bilaterally. Gastrointestinal: Soft and nontender. No distention.  Genitourinary: No  flank tenderness. Musculoskeletal: Chronic appearing venous stasis changes to bilateral lower extremities.  Extremities warm and well perfused.  Neurologic:  Normal speech and language. No gross focal neurologic deficits are appreciated.  Skin:  Skin is warm and dry. No rash noted. Psychiatric: Mood and affect are normal. Speech and behavior are normal.  ____________________________________________   LABS (all labs ordered are listed, but only abnormal results are displayed)  Labs Reviewed  CBC WITH DIFFERENTIAL/PLATELET - Abnormal; Notable for the following components:      Result Value   WBC 14.0 (*)    RBC 2.60 (*)    Hemoglobin 5.6 (*)    HCT 20.4 (*)    MCV 78.5 (*)  MCH 21.5 (*)    MCHC 27.5 (*)    RDW 22.0 (*)    Neutro Abs 12.7 (*)    Lymphs Abs 0.2 (*)    Abs Immature Granulocytes 0.08 (*)    All other components within normal limits  BASIC METABOLIC PANEL - Abnormal; Notable for the following components:   CO2 15 (*)    Glucose, Bld 191 (*)    Creatinine, Ser 1.85 (*)    Calcium 8.0 (*)    GFR calc non Af Amer 28 (*)    GFR calc Af Amer 33 (*)    All other components within normal limits  LACTIC ACID, PLASMA - Abnormal; Notable for the following components:   Lactic Acid, Venous 6.1 (*)    All other components within normal limits  GLUCOSE, CAPILLARY - Abnormal; Notable for the following components:   Glucose-Capillary 173 (*)    All other components within normal limits  SARS CORONAVIRUS 2 BY RT PCR (HOSPITAL ORDER, [place] [facility])  CULTURE, BLOOD (ROUTINE X 2)  CULTURE, BLOOD (ROUTINE X 2)  URINALYSIS, ROUTINE W REFLEX MICROSCOPIC  LACTIC ACID, PLASMA  PATHOLOGIST SMEAR REVIEW  TYPE AND SCREEN  PREPARE RBC (CROSSMATCH)  TROPONIN I (HIGH SENSITIVITY)  TROPONIN I (HIGH SENSITIVITY)   ____________________________________________  EKG  ED ECG REPORT I, Arta Silence, the attending physician, personally viewed and  interpreted this ECG.  Date: 10/25/2018 EKG Time: 2025 Rate: 109 Rhythm: Sinus tachycardia QRS Axis: normal Intervals: RBBB, LAFB ST/T Wave abnormalities: normal Narrative Interpretation: no evidence of acute ischemia  ____________________________________________  RADIOLOGY  CXR: No focal infiltrate or other acute abnormality  ____________________________________________   PROCEDURES  Procedure(s) performed: No  Procedures  Critical Care performed: Yes  CRITICAL CARE Performed by: Arta Silence   Total critical care time: 30 minutes  Critical care time was exclusive of separately billable procedures and treating other patients.  Critical care was necessary to treat or prevent imminent or life-threatening deterioration.  Critical care was time spent personally by me on the following activities: development of treatment plan with patient and/or surrogate as well as nursing, discussions with consultants, evaluation of patient's response to treatment, examination of patient, obtaining history from patient or surrogate, ordering and performing treatments and interventions, ordering and review of laboratory studies, ordering and review of radiographic studies, pulse oximetry and re-evaluation of patient's condition. ____________________________________________   INITIAL IMPRESSION / ASSESSMENT AND PLAN / ED COURSE  Pertinent labs & imaging results that were available during my care of the patient were reviewed by me and considered in my medical decision making (see chart for details).  63 year old female with PMH as noted above including CHF, COPD, and diabetes presents after she became acutely weak while on the toilet this evening and was unable to get up.  The patient states that she fell but did not hit her head and did not pass out.  She reports increased generalized weakness, cough, and shortness of breath over the last few days.  On exam, the patient is weak  appearing with slightly increased work of breathing.  She has a low-grade temperature and borderline tachycardia but her blood pressure is normal.  The remainder of the exam is as described above.  I reviewed the past medical records in [place].  The patient was treated and released in the ED in August with increased leg swelling likely due to CHF.  She was admitted earlier in August with acute on chronic anemia thought to be  due to GI bleed.  Presentation today overall is consistent with likely acute infection.  I suspect possible pneumonia, COVID-19 or other viral syndrome, or urinary tract infection.  Differential also includes less likely cardiac etiology.  We will obtain chest x-ray, lab work-up, give fluids and antipyretic, and reassess.  I anticipate likely admission.  ----------------------------------------- 10:52 PM on 10/25/2018 -----------------------------------------  Chest x-ray shows no focal infiltrate, and the COVID test is negative.  The lab work-up reveals findings concerning for sepsis, with elevated WBC count and lactic acid.  The patient is also significantly anemic.  I ordered broad-spectrum antibiotics and she is receiving fluids.  I have also ordered a blood transfusion.  The vital signs remained stable with an improvement in the heart rate.  The patient had a few low blood pressure readings although I think that this may have been positional as she was lying on her side.  I discussed the results of the work-up so far with the patient.  I signed the patient out to the hospitalist for admission.  _____________________________  Ralph Dowdy was evaluated in Emergency Department on 10/25/2018 for the symptoms described in the history of present illness. She was evaluated in the context of the global COVID-19 pandemic, which necessitated consideration that the patient might be at risk for infection with the SARS-CoV-2 virus that causes COVID-19. Institutional protocols and  algorithms that pertain to the evaluation of patients at risk for COVID-19 are in a state of rapid change based on information released by regulatory bodies including the CDC and federal and state organizations. These policies and algorithms were followed during the patient's care in the ED.  ____________________________________________   FINAL CLINICAL IMPRESSION(S) / ED DIAGNOSES  Final diagnoses:  Sepsis, due to unspecified organism, unspecified whether acute organ dysfunction present ([place])  Anemia, unspecified type      NEW MEDICATIONS STARTED DURING THIS VISIT:  New Prescriptions   No medications on file     Note:  This document was prepared using Dragon voice recognition software and may include unintentional dictation errors.    Arta Silence, MD 10/25/18 2253

## 2018-10-25 NOTE — ED Notes (Addendum)
MD aware of BP. Pt lying on left side in bed, cuff on right forearm, BP checks q6mn. Pt alert and oriented, speaking in complete sentences.

## 2018-10-25 NOTE — ED Triage Notes (Addendum)
Pt to ED via EMS from home. Pt states she was sittign on toilet attempted to get up and fell, no LOC.  Pt states she started feeling short of breath, per ems 88% sats on arrival. Does not wear o2 at home. Pt arrives to ED wearin gNRB at 15L 100%. Pt tachycardic and febrile. Pt states she has had new cough and felt weak x few days.

## 2018-10-26 ENCOUNTER — Other Ambulatory Visit: Payer: Self-pay

## 2018-10-26 ENCOUNTER — Inpatient Hospital Stay: Payer: Medicaid Other

## 2018-10-26 DIAGNOSIS — L8990 Pressure ulcer of unspecified site, unspecified stage: Secondary | ICD-10-CM | POA: Insufficient documentation

## 2018-10-26 DIAGNOSIS — D649 Anemia, unspecified: Secondary | ICD-10-CM | POA: Diagnosis present

## 2018-10-26 DIAGNOSIS — A409 Streptococcal sepsis, unspecified: Secondary | ICD-10-CM | POA: Diagnosis present

## 2018-10-26 DIAGNOSIS — I340 Nonrheumatic mitral (valve) insufficiency: Secondary | ICD-10-CM | POA: Diagnosis not present

## 2018-10-26 DIAGNOSIS — G8929 Other chronic pain: Secondary | ICD-10-CM | POA: Diagnosis present

## 2018-10-26 DIAGNOSIS — Z8541 Personal history of malignant neoplasm of cervix uteri: Secondary | ICD-10-CM | POA: Diagnosis not present

## 2018-10-26 DIAGNOSIS — I5043 Acute on chronic combined systolic (congestive) and diastolic (congestive) heart failure: Secondary | ICD-10-CM | POA: Diagnosis not present

## 2018-10-26 DIAGNOSIS — Q2730 Arteriovenous malformation, site unspecified: Secondary | ICD-10-CM | POA: Diagnosis not present

## 2018-10-26 DIAGNOSIS — Z794 Long term (current) use of insulin: Secondary | ICD-10-CM | POA: Diagnosis not present

## 2018-10-26 DIAGNOSIS — R7881 Bacteremia: Secondary | ICD-10-CM | POA: Diagnosis not present

## 2018-10-26 DIAGNOSIS — N179 Acute kidney failure, unspecified: Secondary | ICD-10-CM | POA: Diagnosis not present

## 2018-10-26 DIAGNOSIS — E1142 Type 2 diabetes mellitus with diabetic polyneuropathy: Secondary | ICD-10-CM | POA: Diagnosis present

## 2018-10-26 DIAGNOSIS — E875 Hyperkalemia: Secondary | ICD-10-CM | POA: Diagnosis not present

## 2018-10-26 DIAGNOSIS — I130 Hypertensive heart and chronic kidney disease with heart failure and stage 1 through stage 4 chronic kidney disease, or unspecified chronic kidney disease: Secondary | ICD-10-CM | POA: Diagnosis present

## 2018-10-26 DIAGNOSIS — R601 Generalized edema: Secondary | ICD-10-CM | POA: Diagnosis not present

## 2018-10-26 DIAGNOSIS — E872 Acidosis: Secondary | ICD-10-CM | POA: Diagnosis not present

## 2018-10-26 DIAGNOSIS — I361 Nonrheumatic tricuspid (valve) insufficiency: Secondary | ICD-10-CM | POA: Diagnosis not present

## 2018-10-26 DIAGNOSIS — M549 Dorsalgia, unspecified: Secondary | ICD-10-CM | POA: Diagnosis present

## 2018-10-26 DIAGNOSIS — J449 Chronic obstructive pulmonary disease, unspecified: Secondary | ICD-10-CM | POA: Diagnosis present

## 2018-10-26 DIAGNOSIS — L89329 Pressure ulcer of left buttock, unspecified stage: Secondary | ICD-10-CM | POA: Diagnosis not present

## 2018-10-26 DIAGNOSIS — E1152 Type 2 diabetes mellitus with diabetic peripheral angiopathy with gangrene: Secondary | ICD-10-CM | POA: Diagnosis present

## 2018-10-26 DIAGNOSIS — Z23 Encounter for immunization: Secondary | ICD-10-CM | POA: Diagnosis not present

## 2018-10-26 DIAGNOSIS — R531 Weakness: Secondary | ICD-10-CM | POA: Diagnosis not present

## 2018-10-26 DIAGNOSIS — D509 Iron deficiency anemia, unspecified: Secondary | ICD-10-CM | POA: Diagnosis not present

## 2018-10-26 DIAGNOSIS — K5090 Crohn's disease, unspecified, without complications: Secondary | ICD-10-CM | POA: Diagnosis present

## 2018-10-26 DIAGNOSIS — E119 Type 2 diabetes mellitus without complications: Secondary | ICD-10-CM | POA: Diagnosis not present

## 2018-10-26 DIAGNOSIS — D500 Iron deficiency anemia secondary to blood loss (chronic): Secondary | ICD-10-CM | POA: Diagnosis not present

## 2018-10-26 DIAGNOSIS — Z20828 Contact with and (suspected) exposure to other viral communicable diseases: Secondary | ICD-10-CM | POA: Diagnosis present

## 2018-10-26 DIAGNOSIS — I425 Other restrictive cardiomyopathy: Secondary | ICD-10-CM | POA: Diagnosis present

## 2018-10-26 DIAGNOSIS — A419 Sepsis, unspecified organism: Secondary | ICD-10-CM | POA: Diagnosis not present

## 2018-10-26 DIAGNOSIS — E1122 Type 2 diabetes mellitus with diabetic chronic kidney disease: Secondary | ICD-10-CM | POA: Diagnosis present

## 2018-10-26 DIAGNOSIS — L98419 Non-pressure chronic ulcer of buttock with unspecified severity: Secondary | ICD-10-CM | POA: Diagnosis not present

## 2018-10-26 DIAGNOSIS — R6521 Severe sepsis with septic shock: Secondary | ICD-10-CM | POA: Diagnosis present

## 2018-10-26 DIAGNOSIS — N289 Disorder of kidney and ureter, unspecified: Secondary | ICD-10-CM | POA: Diagnosis not present

## 2018-10-26 DIAGNOSIS — I248 Other forms of acute ischemic heart disease: Secondary | ICD-10-CM | POA: Diagnosis present

## 2018-10-26 DIAGNOSIS — I252 Old myocardial infarction: Secondary | ICD-10-CM | POA: Diagnosis not present

## 2018-10-26 DIAGNOSIS — B954 Other streptococcus as the cause of diseases classified elsewhere: Secondary | ICD-10-CM | POA: Diagnosis not present

## 2018-10-26 DIAGNOSIS — E785 Hyperlipidemia, unspecified: Secondary | ICD-10-CM | POA: Diagnosis present

## 2018-10-26 DIAGNOSIS — N10 Acute tubulo-interstitial nephritis: Secondary | ICD-10-CM | POA: Diagnosis present

## 2018-10-26 DIAGNOSIS — K219 Gastro-esophageal reflux disease without esophagitis: Secondary | ICD-10-CM | POA: Diagnosis present

## 2018-10-26 DIAGNOSIS — J9601 Acute respiratory failure with hypoxia: Secondary | ICD-10-CM | POA: Diagnosis not present

## 2018-10-26 LAB — CBC
HCT: 20 % — ABNORMAL LOW (ref 36.0–46.0)
Hemoglobin: 5.4 g/dL — ABNORMAL LOW (ref 12.0–15.0)
MCH: 21.3 pg — ABNORMAL LOW (ref 26.0–34.0)
MCHC: 27 g/dL — ABNORMAL LOW (ref 30.0–36.0)
MCV: 79.1 fL — ABNORMAL LOW (ref 80.0–100.0)
Platelets: 156 10*3/uL (ref 150–400)
RBC: 2.53 MIL/uL — ABNORMAL LOW (ref 3.87–5.11)
RDW: 22.2 % — ABNORMAL HIGH (ref 11.5–15.5)
WBC: 15.1 10*3/uL — ABNORMAL HIGH (ref 4.0–10.5)
nRBC: 0 % (ref 0.0–0.2)

## 2018-10-26 LAB — BLOOD CULTURE ID PANEL (REFLEXED)

## 2018-10-26 LAB — BASIC METABOLIC PANEL
Anion gap: 11 (ref 5–15)
BUN: 26 mg/dL — ABNORMAL HIGH (ref 8–23)
CO2: 16 mmol/L — ABNORMAL LOW (ref 22–32)
Calcium: 7.8 mg/dL — ABNORMAL LOW (ref 8.9–10.3)
Chloride: 110 mmol/L (ref 98–111)
Creatinine, Ser: 2.27 mg/dL — ABNORMAL HIGH (ref 0.44–1.00)
GFR calc Af Amer: 26 mL/min — ABNORMAL LOW (ref >60)
GFR calc non Af Amer: 22 mL/min — ABNORMAL LOW (ref >60)
Glucose, Bld: 183 mg/dL — ABNORMAL HIGH (ref 70–99)
Potassium: 4.7 mmol/L (ref 3.5–5.1)
Sodium: 137 mmol/L (ref 135–145)

## 2018-10-26 LAB — PROCALCITONIN: Procalcitonin: 0.38 ng/mL

## 2018-10-26 LAB — GLUCOSE, CAPILLARY
Glucose-Capillary: 183 mg/dL — ABNORMAL HIGH (ref 70–99)
Glucose-Capillary: 163 mg/dL — ABNORMAL HIGH (ref 70–99)
Glucose-Capillary: 138 mg/dL — ABNORMAL HIGH (ref 70–99)
Glucose-Capillary: 117 mg/dL — ABNORMAL HIGH (ref 70–99)

## 2018-10-26 LAB — PREPARE RBC (CROSSMATCH)

## 2018-10-26 LAB — LIPASE, BLOOD: Lipase: 41 U/L (ref 11–51)

## 2018-10-26 LAB — VANCOMYCIN, RANDOM: Vancomycin Rm: 18

## 2018-10-26 LAB — MRSA PCR SCREENING: MRSA by PCR: POSITIVE — AB

## 2018-10-26 LAB — PATHOLOGIST SMEAR REVIEW

## 2018-10-26 MED ORDER — FOLIC ACID 1 MG PO TABS
1.0000 mg | ORAL_TABLET | Freq: Every day | ORAL | Status: DC
  Administered 2018-10-26 – 2018-11-07 (×12): 1 mg via ORAL
  Filled 2018-10-26 (×12): qty 1

## 2018-10-26 MED ORDER — SPIRONOLACTONE 25 MG PO TABS
12.5000 mg | ORAL_TABLET | Freq: Every day | ORAL | Status: DC
  Administered 2018-10-26: 12.5 mg via ORAL
  Filled 2018-10-26: qty 1
  Filled 2018-10-26 (×2): qty 0.5

## 2018-10-26 MED ORDER — SODIUM CHLORIDE 0.9 % IV SOLN: INTRAVENOUS | Status: DC

## 2018-10-26 MED ORDER — FLUTICASONE PROPIONATE 50 MCG/ACT NA SUSP
2.0000 | Freq: Every day | NASAL | Status: DC
  Administered 2018-10-27 – 2018-11-07 (×11): 2 via NASAL
  Filled 2018-10-26: qty 16

## 2018-10-26 MED ORDER — ACETAMINOPHEN 325 MG PO TABS
650.0000 mg | ORAL_TABLET | Freq: Four times a day (QID) | ORAL | Status: DC | PRN
  Administered 2018-10-27 – 2018-11-05 (×3): 650 mg via ORAL
  Filled 2018-10-26 (×4): qty 2

## 2018-10-26 MED ORDER — ATORVASTATIN CALCIUM 20 MG PO TABS
40.0000 mg | ORAL_TABLET | Freq: Every day | ORAL | Status: DC
  Administered 2018-10-26 – 2018-11-07 (×13): 40 mg via ORAL
  Filled 2018-10-26 (×14): qty 2

## 2018-10-26 MED ORDER — INSULIN ASPART 100 UNIT/ML ~~LOC~~ SOLN
0.0000 [IU] | Freq: Three times a day (TID) | SUBCUTANEOUS | Status: DC
  Administered 2018-10-26 (×2): 2 [IU] via SUBCUTANEOUS
  Administered 2018-10-26 – 2018-10-29 (×2): 1 [IU] via SUBCUTANEOUS
  Administered 2018-10-30: 2 [IU] via SUBCUTANEOUS
  Administered 2018-10-30: 1 [IU] via SUBCUTANEOUS
  Administered 2018-10-31: 3 [IU] via SUBCUTANEOUS
  Administered 2018-10-31: 17:00:00 2 [IU] via SUBCUTANEOUS
  Administered 2018-11-01: 09:00:00 1 [IU] via SUBCUTANEOUS
  Administered 2018-11-02: 09:00:00 2 [IU] via SUBCUTANEOUS
  Administered 2018-11-02 (×2): 1 [IU] via SUBCUTANEOUS
  Administered 2018-11-03 (×2): 2 [IU] via SUBCUTANEOUS
  Administered 2018-11-04 (×2): 1 [IU] via SUBCUTANEOUS
  Administered 2018-11-04: 12:00:00 2 [IU] via SUBCUTANEOUS
  Administered 2018-11-05: 09:00:00 1 [IU] via SUBCUTANEOUS
  Administered 2018-11-05 – 2018-11-06 (×4): 2 [IU] via SUBCUTANEOUS
  Administered 2018-11-07 (×2): 3 [IU] via SUBCUTANEOUS
  Filled 2018-10-26 (×24): qty 1

## 2018-10-26 MED ORDER — CHLORHEXIDINE GLUCONATE CLOTH 2 % EX PADS
6.0000 | MEDICATED_PAD | Freq: Every day | CUTANEOUS | Status: AC
  Administered 2018-10-27 – 2018-10-31 (×5): 6 via TOPICAL

## 2018-10-26 MED ORDER — PREGABALIN 75 MG PO CAPS
150.0000 mg | ORAL_CAPSULE | Freq: Two times a day (BID) | ORAL | Status: DC
  Administered 2018-10-26 – 2018-10-28 (×4): 150 mg via ORAL
  Filled 2018-10-26 (×4): qty 2

## 2018-10-26 MED ORDER — FERROUS SULFATE 325 (65 FE) MG PO TABS
325.0000 mg | ORAL_TABLET | Freq: Two times a day (BID) | ORAL | Status: DC
  Administered 2018-10-26 – 2018-10-27 (×3): 325 mg via ORAL
  Filled 2018-10-26 (×3): qty 1

## 2018-10-26 MED ORDER — PANTOPRAZOLE SODIUM 40 MG IV SOLR
40.0000 mg | Freq: Two times a day (BID) | INTRAVENOUS | Status: DC
  Administered 2018-10-29 – 2018-11-02 (×8): 40 mg via INTRAVENOUS
  Filled 2018-10-26 (×8): qty 40

## 2018-10-26 MED ORDER — HYDROMORPHONE HCL 1 MG/ML IJ SOLN
0.5000 mg | Freq: Once | INTRAMUSCULAR | Status: AC
  Administered 2018-10-26: 0.5 mg via INTRAVENOUS
  Filled 2018-10-26: qty 1

## 2018-10-26 MED ORDER — MUPIROCIN 2 % EX OINT
1.0000 "application " | TOPICAL_OINTMENT | Freq: Two times a day (BID) | CUTANEOUS | Status: AC
  Administered 2018-10-26 – 2018-10-31 (×10): 1 via NASAL
  Filled 2018-10-26: qty 22

## 2018-10-26 MED ORDER — SODIUM CHLORIDE 0.9 % IV SOLN
2.0000 g | Freq: Two times a day (BID) | INTRAVENOUS | Status: DC
  Filled 2018-10-26 (×2): qty 2

## 2018-10-26 MED ORDER — SODIUM CHLORIDE 0.9 % IV SOLN
8.0000 mg/h | INTRAVENOUS | Status: DC
  Administered 2018-10-26 – 2018-10-27 (×3): 8 mg/h via INTRAVENOUS
  Filled 2018-10-26 (×3): qty 80

## 2018-10-26 MED ORDER — MOMETASONE FURO-FORMOTEROL FUM 200-5 MCG/ACT IN AERO
2.0000 | INHALATION_SPRAY | Freq: Two times a day (BID) | RESPIRATORY_TRACT | Status: DC
  Administered 2018-10-26 – 2018-11-07 (×24): 2 via RESPIRATORY_TRACT
  Filled 2018-10-26: qty 8.8

## 2018-10-26 MED ORDER — INSULIN ASPART 100 UNIT/ML ~~LOC~~ SOLN
0.0000 [IU] | Freq: Every day | SUBCUTANEOUS | Status: DC
  Administered 2018-11-05: 2 [IU] via SUBCUTANEOUS
  Filled 2018-10-26 (×2): qty 1

## 2018-10-26 MED ORDER — INSULIN GLARGINE 100 UNIT/ML ~~LOC~~ SOLN
16.0000 [IU] | Freq: Every day | SUBCUTANEOUS | Status: DC
  Administered 2018-10-26 – 2018-10-28 (×3): 16 [IU] via SUBCUTANEOUS
  Filled 2018-10-26 (×4): qty 0.16

## 2018-10-26 MED ORDER — SODIUM CHLORIDE 0.9 % IV SOLN
INTRAVENOUS | Status: DC | PRN
  Administered 2018-10-26 – 2018-11-01 (×4): 250 mL via INTRAVENOUS
  Administered 2018-11-01: 11:00:00 via INTRAVENOUS
  Administered 2018-11-02 – 2018-11-04 (×4): 250 mL via INTRAVENOUS

## 2018-10-26 MED ORDER — ALBUTEROL SULFATE (2.5 MG/3ML) 0.083% IN NEBU: 2.5000 mg | INHALATION_SOLUTION | RESPIRATORY_TRACT | Status: DC | PRN

## 2018-10-26 MED ORDER — SODIUM CHLORIDE 0.9 % IV SOLN
2.0000 g | INTRAVENOUS | Status: DC
  Administered 2018-10-26 – 2018-10-28 (×3): 2 g via INTRAVENOUS
  Filled 2018-10-26: qty 2
  Filled 2018-10-26 (×2): qty 20
  Filled 2018-10-26 (×2): qty 2

## 2018-10-26 MED ORDER — PANTOPRAZOLE SODIUM 40 MG PO TBEC: 40.0000 mg | DELAYED_RELEASE_TABLET | Freq: Two times a day (BID) | ORAL | Status: DC

## 2018-10-26 MED ORDER — SODIUM CHLORIDE 0.9 % IV SOLN
80.0000 mg | Freq: Once | INTRAVENOUS | Status: AC
  Administered 2018-10-26: 80 mg via INTRAVENOUS
  Filled 2018-10-26: qty 80

## 2018-10-26 NOTE — ED Notes (Addendum)
Pts Iv administering vanc infiltrated and removed at this time

## 2018-10-26 NOTE — Progress Notes (Signed)
GI on call   Did not receive consult so far. Order placed at 1 am in the night  . Found out while reviewing list ar 7 pm . I will pass on consult to Dr Marius Ditch to see in the am    Dr Jonathon Bellows MD,MRCP [facility]) Gastroenterology/Hepatology Pager: [phone]

## 2018-10-26 NOTE — Progress Notes (Signed)
PT Cancellation Note  Patient Details Name: [name] MRN: [id number] DOB: 20-Feb-1955   Cancelled Treatment:    Reason Eval/Treat Not Completed: Patient not medically ready;Medical issues which prohibited therapy(Received PT order, reviewed chart. Patient's Hbg is too low at 5.4  to safely perform physical therapy. Will attempt again at later time/date if medically ready and appropriate.)  Everlean Alstrom. Graylon Good, PT, DPT 10/26/18, 11:09 AM

## 2018-10-26 NOTE — Progress Notes (Signed)
CODE SEPSIS - PHARMACY COMMUNICATION  **Broad Spectrum Antibiotics should be administered within 1 hour of Sepsis diagnosis**  Time Code Sepsis Called/Page Received: 0045  Antibiotics Ordered: vanc/cefepime  Time of 1st antibiotic administration: 2218  Additional action taken by pharmacy:   If necessary, Name of Provider/Nurse Contacted:     Tobie Lords ,PharmD Clinical Pharmacist  10/26/2018  3:03 AM

## 2018-10-26 NOTE — Progress Notes (Signed)
PHARMACY - PHYSICIAN COMMUNICATION CRITICAL VALUE ALERT - BLOOD CULTURE IDENTIFICATION (BCID)  [name] is an 63 y.o. female who presented to [facility] on 10/25/2018 with a chief complaint of Sepsis  Assessment:  Blood culture with GPC in 4 of 4 bottles, BCID identified Streptococcus species  Name of physician (or Provider) Contacted: Gouru  Current antibiotics: Cefepime and Vancomycin  Changes to prescribed antibiotics recommended:  Recommendations accepted by provider  Vancomycin and Cefepime discontinued and Ceftriaxone 2g IV q24h started.  Results for orders placed or performed during the hospital encounter of 10/25/18  Blood Culture ID Panel (Reflexed) (Collected: 10/25/2018  8:38 PM)  Result Value Ref Range   Enterococcus species NOT DETECTED NOT DETECTED   Listeria monocytogenes NOT DETECTED NOT DETECTED   Staphylococcus species NOT DETECTED NOT DETECTED   Staphylococcus aureus (BCID) NOT DETECTED NOT DETECTED   Streptococcus species DETECTED (A) NOT DETECTED   Streptococcus agalactiae NOT DETECTED NOT DETECTED   Streptococcus pneumoniae NOT DETECTED NOT DETECTED   Streptococcus pyogenes NOT DETECTED NOT DETECTED   Acinetobacter baumannii NOT DETECTED NOT DETECTED   Enterobacteriaceae species NOT DETECTED NOT DETECTED   Enterobacter cloacae complex NOT DETECTED NOT DETECTED   Escherichia coli NOT DETECTED NOT DETECTED   Klebsiella oxytoca NOT DETECTED NOT DETECTED   Klebsiella pneumoniae NOT DETECTED NOT DETECTED   Proteus species NOT DETECTED NOT DETECTED   Serratia marcescens NOT DETECTED NOT DETECTED   Haemophilus influenzae NOT DETECTED NOT DETECTED   Neisseria meningitidis NOT DETECTED NOT DETECTED   Pseudomonas aeruginosa NOT DETECTED NOT DETECTED   Candida albicans NOT DETECTED NOT DETECTED   Candida glabrata NOT DETECTED NOT DETECTED   Candida krusei NOT DETECTED NOT DETECTED   Candida parapsilosis NOT DETECTED NOT DETECTED   Candida tropicalis NOT  DETECTED NOT DETECTED    Paulina Fusi, PharmD, BCPS 10/26/2018 10:23 AM

## 2018-10-26 NOTE — ED Notes (Signed)
vanc restarted at this time

## 2018-10-26 NOTE — ED Notes (Signed)
Pt has small amount of antibiotics left to be administered, Iv was just placed by Apolonio Schneiders RN, vanc restarted while waiting for blood to be ready. Pt Bp stable and pt resting at this time, NAD.

## 2018-10-26 NOTE — TOC Initial Note (Signed)
Transition of Care [facility]) - Initial/Assessment Note    Patient Details  Name: [name] MRN: [id number] Date of Birth: 14-Sep-1955  Transition of Care [facility]) CM/SW Contact:    Su Hilt, RN Phone Number: 10/26/2018, 11:53 AM  Clinical Narrative:                 Patient would like to go to rehab, she lives at home with her husband whom is in a [facility], she has basically been bed bound and unable to do much for herself at home, she is requiring max. Assist, gave permission to do a bedsearch PASSR number [id number] A FL2 done, bed search sent Expected Discharge Plan: [place] Barriers to Discharge: SNF Pending bed offer, Continued Medical Work up   Patient Goals and CMS Choice Patient states their goals for this hospitalization and ongoing recovery are:: go to rehab      Expected Discharge Plan and Services Expected Discharge Plan: [place] arrangements for the past 2 months: Single Family Home                                      Prior Living Arrangements/Services Living arrangements for the past 2 months: Single Family Home Lives with:: Spouse Patient language and need for interpreter reviewed:: No Do you feel safe going back to the place where you live?: Yes      Need for Family Participation in Patient Care: No (Comment)     Criminal Activity/Legal Involvement Pertinent to Current Situation/Hospitalization: No - Comment as needed  Activities of Daily Living Home Assistive Devices/Equipment: Environmental consultant (specify type), Wheelchair, CBG Meter ADL Screening (condition at time of admission) Patient's cognitive ability adequate to safely complete daily activities?: No Is the patient deaf or have difficulty hearing?: No Does the patient have difficulty seeing, even when wearing glasses/contacts?: No Does the patient have difficulty concentrating, remembering, or making decisions?: Yes Patient able to express need for  assistance with ADLs?: No(only @ times) Does the patient have difficulty dressing or bathing?: Yes Independently performs ADLs?: No Communication: Needs assistance Is this a change from baseline?: Pre-admission baseline Dressing (OT): Needs assistance Is this a change from baseline?: Pre-admission baseline Grooming: Needs assistance Is this a change from baseline?: Pre-admission baseline Feeding: Needs assistance Is this a change from baseline?: Pre-admission baseline Bathing: Needs assistance Is this a change from baseline?: Pre-admission baseline Toileting: Needs assistance Is this a change from baseline?: Pre-admission baseline In/Out Bed: Needs assistance Is this a change from baseline?: Pre-admission baseline Walks in Home: Needs assistance Is this a change from baseline?: Pre-admission baseline Does the patient have difficulty walking or climbing stairs?: Yes Weakness of Legs: Both Weakness of Arms/Hands: None  Permission Sought/Granted   Permission granted to share information with : Yes, Verbal Permission Granted              Emotional Assessment Appearance:: Appears older than stated age Attitude/Demeanor/Rapport: Engaged Affect (typically observed): Appropriate Orientation: : Oriented to Self, Oriented to Place, Oriented to  Time, Oriented to Situation Alcohol / Substance Use: Not Applicable Psych Involvement: No (comment)  Admission diagnosis:  Anemia, unspecified type [D64.9] Sepsis, due to unspecified organism, unspecified whether acute organ dysfunction present [facility]) [A41.9] Patient Active Problem List   Diagnosis Date Noted  . Pressure injury of skin 10/26/2018  . Anxiety 08/15/2018  . Chronic back pain  08/15/2018  . GERD (gastroesophageal reflux disease) 08/15/2018  . History of carpal tunnel syndrome 08/15/2018  . HNP (herniated nucleus pulposus), lumbar 08/15/2018  . Hyperlipidemia 08/15/2018  . Hypertension 08/15/2018  . Insomnia 08/15/2018  .  Left ventricular hypertrophy 08/15/2018  . Obesity 08/15/2018  . Sleep apnea 08/15/2018  . Severe anemia 06/20/2018  . Diabetic polyneuropathy associated with type 2 diabetes mellitus ([place]) 02/23/2018  . Moderate tricuspid regurgitation 02/18/2018  . Acute on chronic diastolic CHF (congestive heart failure) ([place]) 02/08/2018  . 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 Depression 02/05/2018  . Generalized abdominal pain 02/05/2018  . CHF (congestive heart failure) ([place]) 01/13/2018  . B12 deficiency 12/07/2017  . Iron deficiency anemia due to chronic blood loss 12/07/2017  . Folate deficiency 12/07/2017  . GIB (gastrointestinal bleeding) 11/03/2017  . COPD (chronic obstructive pulmonary disease) ([place]) 09/26/2017  . Symptomatic anemia 09/26/2017  . Acute on chronic systolic CHF (congestive heart failure) ([place]) 08/27/2017  . Anemia   . Weakness   . GI bleed 08/07/2017  . Chronic systolic heart failure ([place]) 12/28/2016  . Diabetes ([place]) 12/28/2016  . Tobacco use 12/28/2016  . NSTEMI (non-ST elevated myocardial infarction) ([place])   . Sepsis ([place]) 12/18/2016  . CHF exacerbation ([place]) 12/04/2015  . PNA (pneumonia) 10/28/2015  . COPD exacerbation ([place]) 08/29/2015  . Chronic hepatitis C without hepatic coma ([place]) 05/21/2014  . CAD (coronary artery disease) 07/19/2013  . Right arm pain 06/26/2013  . Cervical pain (neck) 03/28/2013  . Radiculopathy affecting upper extremity 03/28/2013  . Impaired renal function 12/21/2012  . Abnormal mammogram 12/20/2012  . Diabetes mellitus type 2, uncontrolled ([place]) 12/20/2012   PCP:  [facility]:   [place], [place], [place] [address] [place] [address] Phone: [phone] Fax: [place], [place] - [address] [place] [address] Phone: [phone] Fax: [phone]  [id number] [id number]: [phone]: [phone]    Social Determinants of Health (SDOH) Interventions    Readmission Risk Interventions Readmission Risk Prevention Plan 08/21/2018 09/30/2017  Transportation Screening Complete Complete  PCP or Specialist Appt within 5-7 Days - Complete  Home Care Screening - Complete  Medication Review (RN CM) - Complete  HRI or Home Care Consult Complete -  Palliative Care Screening Not Applicable -  Medication Review (RN Care Manager) Complete -  Some recent data might be hidden

## 2018-10-26 NOTE — ED Notes (Signed)
Patient transported to CT 

## 2018-10-26 NOTE — Progress Notes (Signed)
[place] [facility] [place] NAME: [name]    MR#:  [id number]  DATE OF BIRTH:  1956-01-12  SUBJECTIVE:  CHIEF COMPLAINT: Patient is feeling better than yesterday answers questions but falling asleep as she did not get good sleep last night  REVIEW OF SYSTEMS:  CONSTITUTIONAL: No fever, reports fatigue or weakness.  EYES: No blurred or double vision.  EARS, NOSE, AND THROAT: No tinnitus or ear pain.  RESPIRATORY: No cough, shortness of breath, wheezing or hemoptysis.  CARDIOVASCULAR: No chest pain, orthopnea, edema.  GASTROINTESTINAL: No nausea, vomiting, diarrhea or abdominal pain.  GENITOURINARY: No dysuria, hematuria.  ENDOCRINE: No polyuria, nocturia,  HEMATOLOGY: No anemia, easy bruising or bleeding SKIN: No rash or lesion. MUSCULOSKELETAL: No joint pain or arthritis.   NEUROLOGIC: No tingling, numbness, weakness.  PSYCHIATRY: No anxiety or depression.   DRUG ALLERGIES:   Allergies  Allergen Reactions  . Latex Anaphylaxis and Rash  . Gabapentin Nausea And Vomiting and Other (See Comments)    VITALS:  Blood pressure (!) 113/56, pulse 89, temperature 98.4 F (36.9 C), temperature source Oral, resp. rate 20, height 5' 8"  (1.727 m), weight 125.1 kg, SpO2 100 %.  PHYSICAL EXAMINATION:  GENERAL:  63 y.o.-year-old patient lying in the bed with no acute distress.  EYES: Pupils equal, round, reactive to light and accommodation. No scleral icterus. Extraocular muscles intact.  HEENT: Head atraumatic, normocephalic. Oropharynx and nasopharynx clear.  NECK:  Supple, no jugular venous distention. No thyroid enlargement, no tenderness.  LUNGS: Normal breath sounds bilaterally, no wheezing, rales,rhonchi or crepitation. No use of accessory muscles of respiration.  CARDIOVASCULAR: S1, S2 normal. No murmurs, rubs, or gallops.  ABDOMEN: Soft, nontender, nondistended. Bowel sounds present.  EXTREMITIES: No pedal edema, cyanosis, or  clubbing.  NEUROLOGIC: Patient is arousable oriented x2-3 sensation intact. Gait not checked.  PSYCHIATRIC: The patient is alert and oriented x2- 3.  SKIN: No obvious rash, lesion, or ulcer.    LABORATORY PANEL:   CBC Recent Labs  Lab 10/26/18 0500  WBC 15.1*  HGB 5.4*  HCT 20.0*  PLT 156   ------------------------------------------------------------------------------------------------------------------  Chemistries  Recent Labs  Lab 10/26/18 0500  NA 137  K 4.7  CL 110  CO2 16*  GLUCOSE 183*  BUN 26*  CREATININE 2.27*  CALCIUM 7.8*   ------------------------------------------------------------------------------------------------------------------  Cardiac Enzymes No results for input(s): TROPONINI in the last 168 hours. ------------------------------------------------------------------------------------------------------------------  RADIOLOGY:  Ct Abdomen Pelvis Wo Contrast  Result Date: 10/26/2018 CLINICAL DATA:  Abdominal pain, gastroenteritis or colitis EXAM: CT ABDOMEN AND PELVIS WITHOUT CONTRAST TECHNIQUE: Multidetector CT imaging of the abdomen and pelvis was performed following the standard protocol without IV contrast. COMPARISON:  June 21, 2018 FINDINGS: Lower chest: There is moderate cardiomegaly. Small bilateral pleural effusions are seen, right greater than left with peripheral atelectasis. Hepatobiliary: Although limited due to the lack of intravenous contrast, normal in appearance without gross focal abnormality. The patient is status post cholecystectomy. No biliary ductal dilation. Pancreas:  Unremarkable.  No surrounding inflammatory changes. Spleen: Normal in size. Although limited due to the lack of intravenous contrast, normal in appearance. Adrenals/Urinary Tract: Both adrenal glands appear normal. Again noted is a 6 cm low-density lesion lower pole of the left kidney brought to pull left-sided low-density lesions are seen. Bilateral perinephric  stranding is seen. This appears to be more prominent than the prior exam, and is asymmetric, right greater than left. Stomach/Bowel: The stomach and small bowel are normal in size  and contour. There is a moderate amount of colonic stool. Scattered colonic diverticula are noted without diverticulitis. Vascular/Lymphatic: There are no enlarged abdominal or pelvic lymph nodes. Scattered aortic atherosclerotic calcifications are seen without aneurysmal dilatation. Reproductive: The uterus and adnexa are unremarkable. Other: There is diffuse extensive anasarca seen. There is a small fat containing anterior umbilical hernia. A small amount of perihepatic ascites is seen. Musculoskeletal: No acute or significant osseous findings. IMPRESSION: 1. Diffuse extensive anasarca. 2. Small bilateral pleural effusions, slightly increased in size from the prior exam. 3. Small amount of perihepatic ascites. 4. Bilateral perinephric stranding, right greater than left, increased from the prior exam. This could be due to acute pyelonephritis. 5. Colonic diverticula without diverticulitis. Electronically Signed   By: Prudencio Pair M.D.   On: 10/26/2018 03:12   Dg Chest Portable 1 View  Result Date: 10/25/2018 CLINICAL DATA:  Fall EXAM: PORTABLE CHEST 1 VIEW COMPARISON:  08/29/2018 FINDINGS: Mild cardiomegaly with prosthetic valve and left atrial closure device. No pulmonary edema or focal airspace consolidation. No pleural effusion or pneumothorax. IMPRESSION: No active cardiopulmonary disease. Electronically Signed   By: Ulyses Jarred M.D.   On: 10/25/2018 21:14    EKG:   Orders placed or performed during the hospital encounter of 10/25/18  . EKG 12-Lead  . EKG 12-Lead  . EKG 12-Lead  . EKG 12-Lead    ASSESSMENT AND PLAN:   63 y.o. 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 Delana Meyer, and CKD to the ED with worsening shortness of breath and fall.  1. Sepsis - Patient met septic criteria at the time of admission with tachycardia, tachypnea, elevated lactic acid and fever,- Chest x-ray shows no active cardiopulmonary disease - Covid negative -Lactic acid at 3.9 9 we will repeat lactic acid and monitor procalcitonin - CT abdomen and pelvis to eval for abdominal/flank pain evaluate for possible pyelonephritis - UA and culture pending - Blood cultures with gram-positive cocci in 4 out of 4 bottles -Initially patient was started on empiric abx with cefepime, vancomycin and Flagyl, changed antibiotics to Rocephin today and discontinued cefepime and vancomycin  2.  Acute on chronic iron deficiency anemia -secondary to chronic blood loss from gastric and small bowel AVMs -s/p EGD (06/22/18) with oozing gastric ulcers treated with bipolar cautery, colonoscopy, capsule endoscopy as well as small bowel enteroscopy (06/2018) - Current hemoglobin 5.6 status post 1 unit of blood transfusion and getting second unit, will repeat hemoglobin 2 hours after blood transfusion - IVF resuscitation to maintain MAP>65 - H&H monitoring q6h -Transfuse more units of blood if repeat hemoglobin after blood transfusion is less than 8 - Pantoprazole 21m IV x1 then gtt 876mhr - GI Consult , Dr. AnVicente Malesotified he is aware of the consult - Hold NSAIDs, steroids, ASA - Will need IV iron infusion possibly tomorrow after blood transfusion today  3. Elevated troponin -likely demand ischemia - Continue to trend troponin  4. DM: sugars have been well-controlled on current regimen  - hemoglobin A1c 1 6/5 was 5.9 - sliding scale insulin coverage in addition to Lantus - ADA 2100 calorie diet    5. Chronic congestive heart failure with reduced EF - stable Echocardiogram with moderate LVH and EF  at 40 to45% and diffuse hypokinesis noted. Patient has had bioprosthesis for aortic valve -  Hold Lasix, Demadex, Coreg in the setting of hypotension - Low salt diet   6. COPD -noevidence of exacerbation.  - Supplemental O2, goal sat 88-92% - Duonebs q6hours - Continue home inhalers  7. Acute on chronic Kidney Disease -BUN/Cr slightly elevated above baseline - Hold nephrotoxins - Continue to monitor renal function  8. DVT prophylaxis - Hold anti-coagulation due to GI bleed  -Provide SCDs  9.weakness-PT eval    All the records are reviewed and case discussed with Care Management/Social Workerr. Management plans discussed with the patient, husband wender and they are in agreement.  CODE STATUS: fc   TOTAL TIME TAKING CARE OF THIS PATIENT: 35  minutes.   POSSIBLE D/C IN 2 DAYS, DEPENDING ON CLINICAL CONDITION.  Note: This dictation was prepared with Dragon dictation along with smaller phrase technology. Any transcriptional errors that result from this process are unintentional.   Nicholes Mango M.D on 10/26/2018 at 4:02 PM  Between 7am to 6pm - Pager - [phone] After 6pm go to [email or web address] - password [email or web address] [facility]  Office  [phone]  CC: Primary care physician; [place]

## 2018-10-26 NOTE — ED Notes (Signed)
Iv team at bedside  

## 2018-10-26 NOTE — ED Notes (Signed)
vanc still infusing at this time. Pt has single IV access

## 2018-10-26 NOTE — ED Notes (Signed)
ED TO INPATIENT HANDOFF REPORT  ED Nurse Name and Phone #: gracie  S Name/Age/Gender [name] 63 y.o. female Room/Bed: ED10A/ED10A  Code Status   Code Status: Full Code  Home/SNF/Other Home Patient oriented to: self, place, time and situation Is this baseline? Yes   Triage Complete: Triage complete  Chief Complaint shortness of breath  Triage Note Pt to ED via EMS from home. Pt states she was sittign on toilet attempted to get up and fell, no LOC.  Pt states she started feeling short of breath, per ems 88% sats on arrival. Does not wear o2 at home. Pt arrives to ED wearin gNRB at 15L 100%. Pt tachycardic and febrile. Pt states she has had new cough and felt weak x few days.   Allergies Allergies  Allergen Reactions  . Latex Anaphylaxis and Rash  . Gabapentin Nausea And Vomiting and Other (See Comments)    Level of Care/Admitting Diagnosis ED Disposition    ED Disposition Condition [facility]: [place] [[id number]]  Level of Care: Med-Surg [16]  Covid Evaluation: Confirmed COVID Negative  Diagnosis: Sepsis [facility]) [[id number]]  Admitting Physician: Eula Flax  Attending Physician: Rufina Falco ACHIENG [[id number]]  Estimated length of stay: past midnight tomorrow  Certification:: I certify this patient will need inpatient services for at least 2 midnights  PT Class (Do Not Modify): Inpatient [101]  PT Acc Code (Do Not Modify): Private [1]       B Medical/Surgery History Past Medical History:  Diagnosis Date  . Cervical cancer ([place])   . CHF (congestive heart failure) ([place])   . Chronic anemia   . COPD (chronic obstructive pulmonary disease) ([place])   . Diabetes mellitus without complication ([place])   . Gastric ulcer   . NSTEMI (non-ST elevated myocardial infarction) ([place])   . Upper GI bleed    Past Surgical History:  Procedure Laterality Date  . AMPUTATION Left 06/24/2018   Procedure: AMPUTATION  GREAT TOE;  Surgeon: Samara Deist, DPM;  Location: [facility] ORS;  Service: Podiatry;  Laterality: Left;  . APPENDECTOMY    . CARDIAC CATHETERIZATION    . CHOLECYSTECTOMY    . COLONOSCOPY N/A 09/28/2017   Procedure: COLONOSCOPY;  Surgeon: Toledo, Benay Pike, MD;  Location: [facility] ENDOSCOPY;  Service: Gastroenterology;  Laterality: N/A;  . COLONOSCOPY WITH PROPOFOL N/A 08/21/2018   Procedure: COLONOSCOPY WITH PROPOFOL;  Surgeon: Lin Landsman, MD;  Location: [facility] ENDOSCOPY;  Service: Gastroenterology;  Laterality: N/A;  . ENTEROSCOPY Left 11/06/2017   Procedure: ENTEROSCOPY;  Surgeon: Jonathon Bellows, MD;  Location: [facility] ENDOSCOPY;  Service: Gastroenterology;  Laterality: Left;  . ENTEROSCOPY N/A 06/22/2018   Procedure: ENTEROSCOPY;  Surgeon: Lin Landsman, MD;  Location: [facility] ENDOSCOPY;  Service: Gastroenterology;  Laterality: N/A;  . ENTEROSCOPY N/A 08/18/2018   Procedure: ENTEROSCOPY;  Surgeon: Jonathon Bellows, MD;  Location: [facility] ENDOSCOPY;  Service: Gastroenterology;  Laterality: N/A;  . ESOPHAGOGASTRODUODENOSCOPY N/A 12/19/2016   Procedure: ESOPHAGOGASTRODUODENOSCOPY (EGD);  Surgeon: Lin Landsman, MD;  Location: [facility] ENDOSCOPY;  Service: Gastroenterology;  Laterality: N/A;  . ESOPHAGOGASTRODUODENOSCOPY N/A 09/28/2017   Procedure: ESOPHAGOGASTRODUODENOSCOPY (EGD);  Surgeon: Toledo, Benay Pike, MD;  Location: [facility] ENDOSCOPY;  Service: Gastroenterology;  Laterality: N/A;  . ESOPHAGOGASTRODUODENOSCOPY (EGD) WITH PROPOFOL N/A 08/08/2017   Procedure: ESOPHAGOGASTRODUODENOSCOPY (EGD) WITH PROPOFOL;  Surgeon: [name], MD;  Location: [facility] ENDOSCOPY;  Service: Endoscopy;  Laterality: N/A;  . heart [place] in [place]    . LEFT HEART CATH AND  CORONARY ANGIOGRAPHY N/A 12/23/2016   Procedure: LEFT HEART CATH AND CORONARY ANGIOGRAPHY;  Surgeon: Teodoro Spray, MD;  Location: [place] CV LAB;  Service: Cardiovascular;  Laterality: N/A;  . LOWER EXTREMITY ANGIOGRAPHY Left 06/26/2018   Procedure:  Lower Extremity Angiography;  Surgeon: Katha Cabal, MD;  Location: [place] CV LAB;  Service: Cardiovascular;  Laterality: Left;     A IV Location/Drains/Wounds Patient Lines/Drains/Airways Status   Active Line/Drains/Airways    Name:   Placement date:   Placement time:   Site:   Days:   Peripheral IV 10/25/18 Left Antecubital   10/25/18    2127    Antecubital   1   Peripheral IV 10/25/18 Left;Medial Antecubital   10/25/18    2211    Antecubital   1   Peripheral IV 10/26/18 Left Antecubital   10/26/18    0112    Antecubital   less than 1   Incision (Closed) 06/24/18 Foot Left   06/24/18    0846     124   Wound / Incision (Open or Dehisced) 06/21/18 Diabetic ulcer Left Great Toe   06/21/18    0230    -   127   Wound / Incision (Open or Dehisced) 06/21/18 Diabetic ulcer Toe (Comment  which one) Posterior;Lateral;Left 4th    06/21/18    0230    Toe (Comment  which one)   127   Wound / Incision (Open or Dehisced) 06/21/18 Diabetic ulcer Toe (Comment  which one) Left;Posterior 5th toe   06/21/18    0230    Toe (Comment  which one)   127          Intake/Output Last 24 hours  Intake/Output Summary (Last 24 hours) at 10/26/2018 0115 Last data filed at 10/25/2018 2319 Gross per 24 hour  Intake 100 ml  Output -  Net 100 ml    Labs/Imaging Results for orders placed or performed during the hospital encounter of 10/25/18 (from the past 48 hour(s))  CBC with Differential     Status: Abnormal   Collection Time: 10/25/18  8:38 PM  Result Value Ref Range   WBC 14.0 (H) 4.0 - 10.5 K/uL   RBC 2.60 (L) 3.87 - 5.11 MIL/uL   Hemoglobin 5.6 (L) 12.0 - 15.0 g/dL   HCT 20.4 (L) 36.0 - 46.0 %   MCV 78.5 (L) 80.0 - 100.0 fL   MCH 21.5 (L) 26.0 - 34.0 pg   MCHC 27.5 (L) 30.0 - 36.0 g/dL   RDW 22.0 (H) 11.5 - 15.5 %   Platelets 182 150 - 400 K/uL   nRBC 0.0 0.0 - 0.2 %   Neutrophils Relative % 90 %   Neutro Abs 12.7 (H) 1.7 - 7.7 K/uL   Lymphocytes Relative 2 %   Lymphs Abs 0.2 (L) 0.7 -  4.0 K/uL   Monocytes Relative 6 %   Monocytes Absolute 0.8 0.1 - 1.0 K/uL   Eosinophils Relative 1 %   Eosinophils Absolute 0.1 0.0 - 0.5 K/uL   Basophils Relative 0 %   Basophils Absolute 0.0 0.0 - 0.1 K/uL   Immature Granulocytes 1 %   Abs Immature Granulocytes 0.08 (H) 0.00 - 0.07 K/uL   Schistocytes PRESENT    Tear Drop Cells PRESENT    Polychromasia PRESENT    Target Cells PRESENT    Stomatocytes PRESENT     Comment: Performed at [facility], [address], [place], [place] [address]  Basic metabolic panel  Status: Abnormal   Collection Time: 10/25/18  8:38 PM  Result Value Ref Range   Sodium 135 135 - 145 mmol/L   Potassium 4.3 3.5 - 5.1 mmol/L   Chloride 109 98 - 111 mmol/L   CO2 15 (L) 22 - 32 mmol/L   Glucose, Bld 191 (H) 70 - 99 mg/dL   BUN 23 8 - 23 mg/dL   Creatinine, Ser 1.85 (H) 0.44 - 1.00 mg/dL   Calcium 8.0 (L) 8.9 - 10.3 mg/dL   GFR calc non Af Amer 28 (L) >60 mL/min   GFR calc Af Amer 33 (L) >60 mL/min   Anion gap 11 5 - 15    Comment: Performed at [facility], [address] [place], [place] [address]  Lactic acid, plasma     Status: Abnormal   Collection Time: 10/25/18  8:38 PM  Result Value Ref Range   Lactic Acid, Venous 6.1 (HH) 0.5 - 1.9 mmol/L    Comment: CRITICAL RESULT CALLED TO, READ BACK BY AND VERIFIED WITH GRACIE [facility] @2114  10/25/18 MJU Performed at [facility], [address] [place], [place] [address]   SARS Coronavirus 2 by RT PCR (hospital order, performed in [place] hospital lab) Nasopharyngeal Nasopharyngeal Swab     Status: None   Collection Time: 10/25/18  8:49 PM   Specimen: Nasopharyngeal Swab  Result Value Ref Range   SARS Coronavirus 2 NEGATIVE NEGATIVE    Comment: (NOTE) If result is NEGATIVE SARS-CoV-2 target nucleic acids are NOT DETECTED. The SARS-CoV-2 RNA is generally detectable in upper and lower  respiratory specimens during the acute phase of infection. The lowest   concentration of SARS-CoV-2 viral copies this assay can detect is 250  copies / mL. A negative result does not preclude SARS-CoV-2 infection  and should not be used as the sole basis for treatment or other  patient management decisions.  A negative result may occur with  improper specimen collection / handling, submission of specimen other  than nasopharyngeal swab, presence of viral mutation(s) within the  areas targeted by this assay, and inadequate number of viral copies  (<250 copies / mL). A negative result must be combined with clinical  observations, patient history, and epidemiological information. If result is POSITIVE SARS-CoV-2 target nucleic acids are DETECTED. The SARS-CoV-2 RNA is generally detectable in upper and lower  respiratory specimens dur ing the acute phase of infection.  Positive  results are indicative of active infection with SARS-CoV-2.  Clinical  correlation with patient history and other diagnostic information is  necessary to determine patient infection status.  Positive results do  not rule out bacterial infection or co-infection with other viruses. If result is PRESUMPTIVE POSTIVE SARS-CoV-2 nucleic acids MAY BE PRESENT.   A presumptive positive result was obtained on the submitted specimen  and confirmed on repeat testing.  While 2019 novel coronavirus  (SARS-CoV-2) nucleic acids may be present in the submitted sample  additional confirmatory testing may be necessary for epidemiological  and / or clinical management purposes  to differentiate between  SARS-CoV-2 and other Sarbecovirus currently known to infect humans.  If clinically indicated additional testing with an alternate test  methodology [phone]) is advised. The SARS-CoV-2 RNA is generally  detectable in upper and lower respiratory sp ecimens during the acute  phase of infection. The expected result is Negative. Fact Sheet for Patients:  StrictlyIdeas.no Fact Sheet  for Healthcare Providers: BankingDealers.co.za This test is not yet approved or cleared by the [place]  FDA and has been authorized for detection and/or diagnosis of SARS-CoV-2 by FDA under an Emergency Use Authorization (EUA).  This EUA will remain in effect (meaning this test can be used) for the duration of the COVID-19 declaration under Section 564(b)(1) of the Act, 21 U.S.C. section 360bbb-3(b)(1), unless the authorization is terminated or revoked sooner. Performed at [facility], [address] [place], [place] [address]   Glucose, capillary     Status: Abnormal   Collection Time: 10/25/18  8:50 PM  Result Value Ref Range   Glucose-Capillary 173 (H) 70 - 99 mg/dL  Lactic acid, plasma     Status: Abnormal   Collection Time: 10/25/18 10:11 PM  Result Value Ref Range   Lactic Acid, Venous 3.9 (HH) 0.5 - 1.9 mmol/L    Comment: CRITICAL RESULT CALLED TO, READ BACK BY AND VERIFIED WITH Terri Piedra [facility] AT 2336 ON 10/25/18 RWW Performed at [facility], [address], [place], [place] [address]   Prepare RBC     Status: None   Collection Time: 10/25/18 10:11 PM  Result Value Ref Range   Order Confirmation      ORDER PROCESSED BY BLOOD BANK Performed at [facility], [address], [place], [place] [address]   Type and screen [place]     Status: None (Preliminary result)   Collection Time: 10/25/18 10:11 PM  Result Value Ref Range   ABO/RH(D) O POS    Antibody Screen NEG    Sample Expiration      10/28/2018,2359 Performed at [facility], [address] [place], [place] [address]    Unit Number [id number]    Blood Component Type RED CELLS,LR    Unit division 00    Status of Unit ALLOCATED    Transfusion Status OK TO TRANSFUSE    Crossmatch Result Compatible    Unit Number [id number]    Blood Component Type RED CELLS,LR    Unit division 00    Status of Unit ALLOCATED     Transfusion Status OK TO TRANSFUSE    Crossmatch Result Compatible   Troponin I (High Sensitivity)     Status: Abnormal   Collection Time: 10/25/18 10:50 PM  Result Value Ref Range   Troponin I (High Sensitivity) 89 (H) <18 ng/L    Comment: (NOTE) Elevated high sensitivity troponin I (hsTnI) values and significant  changes across serial measurements may suggest ACS but many other  chronic and acute conditions are known to elevate hsTnI results.  Refer to the "Links" section for chest pain algorithms and additional  guidance. Performed at [facility], [address] [place], [place] [address]    Dg Chest Portable 1 View  Result Date: 10/25/2018 CLINICAL DATA:  Fall EXAM: PORTABLE CHEST 1 VIEW COMPARISON:  08/29/2018 FINDINGS: Mild cardiomegaly with prosthetic valve and left atrial closure device. No pulmonary edema or focal airspace consolidation. No pleural effusion or pneumothorax. IMPRESSION: No active cardiopulmonary disease. Electronically Signed   By: Ulyses Jarred M.D.   On: 10/25/2018 21:14    Pending Labs Unresulted Labs (From admission, onward)    Start     Ordered   10/26/18 0500  CBC  Tomorrow morning,   STAT     10/26/18 0048   10/26/18 5732  Basic metabolic panel  Tomorrow morning,   STAT     10/26/18 0048   10/26/18 0047  Procalcitonin  Add-on,   AD     10/26/18 0048   10/25/18  2041  Urinalysis, Routine w reflex microscopic  Once,   STAT     10/25/18 2040   10/25/18 2041  Blood culture (routine x 2)  BLOOD CULTURE X 2,   STAT     10/25/18 2040   10/25/18 2038  Pathologist smear review  Once,   STAT     10/25/18 2038          Vitals/Pain Today's Vitals   10/26/18 0045 10/26/18 0049 10/26/18 0111 10/26/18 0111  BP: 115/62   113/60  Pulse: 99   99  Resp: 20   20  Temp:    98.4 F (36.9 C)  TempSrc:    Oral  SpO2: 98%   99%  Weight:      Height:      PainSc:  0-No pain 5      Isolation Precautions No active  isolations  Medications Medications  vancomycin (VANCOCIN) 2,000 mg in sodium chloride 0.9 % 500 mL IVPB (2,000 mg Intravenous New Bag/Given 10/25/18 2318)  atorvastatin (LIPITOR) tablet 40 mg (has no administration in time range)  spironolactone (ALDACTONE) tablet 12.5 mg (has no administration in time range)  Insulin Glargine (LANTUS) Solostar Pen 16 Units (has no administration in time range)  pantoprazole (PROTONIX) EC tablet 40 mg (has no administration in time range)  ferrous sulfate tablet 325 mg (has no administration in time range)  folic acid (FOLVITE) tablet 1 mg (has no administration in time range)  pregabalin (LYRICA) capsule 150 mg (has no administration in time range)  fluticasone (FLONASE) 50 MCG/ACT nasal spray 2 spray (has no administration in time range)  albuterol (VENTOLIN HFA) 108 (90 Base) MCG/ACT inhaler 1-2 puff (has no administration in time range)  mometasone-formoterol (DULERA) 200-5 MCG/ACT inhaler 2 puff (has no administration in time range)  0.9 %  sodium chloride infusion (has no administration in time range)  insulin aspart (novoLOG) injection 0-5 Units (has no administration in time range)  insulin aspart (novoLOG) injection 0-9 Units (has no administration in time range)  acetaminophen (TYLENOL) tablet 1,000 mg (1,000 mg Oral Given 10/25/18 2108)  sodium chloride 0.9 % bolus 1,000 mL (1,000 mLs Intravenous New Bag/Given 10/25/18 2127)  ceFEPIme (MAXIPIME) 2 g in sodium chloride 0.9 % 100 mL IVPB (0 g Intravenous Stopped 10/25/18 2319)    Mobility walks with person assist Moderate fall risk   Focused Assessments sepsis   R Recommendations: See Admitting Provider Note  Report given to:   Additional Notes:

## 2018-10-26 NOTE — NC FL2 (Signed)
[place] LEVEL OF CARE SCREENING TOOL     IDENTIFICATION  Patient Name: [name] Birthdate: 06/05/1955 Sex: female Admission Date (Current Location): 10/25/2018  [place] and [place] Number:  Engineering geologist and Address:  [facility], [address] [place],  [address]      Provider Number: [id number]  Attending Physician Name and Address:  Nicholes Mango, MD  Relative Name and Phone Number:  Katheren Puller HUsband [phone]    Current Level of Care: Hospital Recommended Level of Care: [place] Prior Approval Number:    Date Approved/Denied:   PASRR Number: [id number]  Discharge Plan: SNF    Current Diagnoses: Patient Active Problem List   Diagnosis Date Noted  . Pressure injury of skin 10/26/2018  . Anxiety 08/15/2018  . Chronic back pain 08/15/2018  . GERD (gastroesophageal reflux disease) 08/15/2018  . History of carpal tunnel syndrome 08/15/2018  . HNP (herniated nucleus pulposus), lumbar 08/15/2018  . Hyperlipidemia 08/15/2018  . Hypertension 08/15/2018  . Insomnia 08/15/2018  . Left ventricular hypertrophy 08/15/2018  . Obesity 08/15/2018  . Sleep apnea 08/15/2018  . Severe anemia 06/20/2018  . Diabetic polyneuropathy associated with type 2 diabetes mellitus ([place]) 02/23/2018  . Moderate tricuspid regurgitation 02/18/2018  . Acute on chronic diastolic CHF (congestive heart failure) ([place]) 02/08/2018  . 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 Depression 02/05/2018  . Generalized abdominal pain 02/05/2018  . CHF (congestive heart failure) ([place]) 01/13/2018  . B12 deficiency 12/07/2017  . Iron deficiency anemia due to chronic blood loss 12/07/2017  . Folate deficiency 12/07/2017  . GIB (gastrointestinal bleeding) 11/03/2017  . COPD (chronic obstructive pulmonary disease) ([place]) 09/26/2017  . Symptomatic anemia 09/26/2017  . Acute on chronic systolic CHF (congestive heart failure)  ([place]) 08/27/2017  . Anemia   . Weakness   . GI bleed 08/07/2017  . Chronic systolic heart failure ([place]) 12/28/2016  . Diabetes ([place]) 12/28/2016  . Tobacco use 12/28/2016  . NSTEMI (non-ST elevated myocardial infarction) ([place])   . Sepsis ([place]) 12/18/2016  . CHF exacerbation ([place]) 12/04/2015  . PNA (pneumonia) 10/28/2015  . COPD exacerbation ([place]) 08/29/2015  . Chronic hepatitis C without hepatic coma ([place]) 05/21/2014  . CAD (coronary artery disease) 07/19/2013  . Right arm pain 06/26/2013  . Cervical pain (neck) 03/28/2013  . Radiculopathy affecting upper extremity 03/28/2013  . Impaired renal function 12/21/2012  . Abnormal mammogram 12/20/2012  . Diabetes mellitus type 2, uncontrolled ([place]) 12/20/2012    Orientation RESPIRATION BLADDER Height & Weight     Self, Time, Situation, Place    Incontinent Weight: 125.1 kg Height:  5' 8"  (172.7 cm)  BEHAVIORAL SYMPTOMS/MOOD NEUROLOGICAL BOWEL NUTRITION STATUS      Incontinent    AMBULATORY STATUS COMMUNICATION OF NEEDS Skin   Extensive Assist Verbally Normal                       Personal Care Assistance Level of Assistance  Total care       Total Care Assistance: Maximum assistance   Functional Limitations Info             SPECIAL CARE FACTORS FREQUENCY  PT (By licensed PT)     PT Frequency: 5 times per week              Contractures Contractures Info: Not present    Additional Factors Info  Allergies   Allergies Info: Latex, Gabapentin  Current Medications (10/26/2018):  This is the current hospital active medication list Current Facility-Administered Medications  Medication Dose Route Frequency Provider Last Rate Last Dose  . 0.9 %  sodium chloride infusion   Intravenous PRN Lang Snow, NP 20 mL/hr at 10/26/18 1011    . acetaminophen (TYLENOL) tablet 650 mg  650 mg Oral Q6H PRN Gouru, Aruna, MD      . albuterol (PROVENTIL) (2.5 MG/3ML) 0.083% nebulizer solution 2.5-5 mg   2.5-5 mg Inhalation Q4H PRN Lang Snow, NP      . atorvastatin (LIPITOR) tablet 40 mg  40 mg Oral Daily Ouma, Bing Neighbors, NP      . cefTRIAXone (ROCEPHIN) 2 g in sodium chloride 0.9 % 100 mL IVPB  2 g Intravenous Q24H Gouru, Aruna, MD 200 mL/hr at 10/26/18 1150 2 g at 10/26/18 1150  . ferrous sulfate tablet 325 mg  325 mg Oral BID WC Lang Snow, NP   325 mg at 10/26/18 0852  . fluticasone (FLONASE) 50 MCG/ACT nasal spray 2 spray  2 spray Each Nare Daily Ouma, Bing Neighbors, NP      . folic acid (FOLVITE) tablet 1 mg  1 mg Oral Daily Lang Snow, NP   1 mg at 10/26/18 0857  . insulin aspart (novoLOG) injection 0-5 Units  0-5 Units Subcutaneous QHS Lang Snow, NP   Stopped at 10/26/18 [phone]  . insulin aspart (novoLOG) injection 0-9 Units  0-9 Units Subcutaneous TID WC Lang Snow, NP   2 Units at 10/26/18 [phone]  . insulin glargine (LANTUS) injection 16 Units  16 Units Subcutaneous QHS Ouma, Bing Neighbors, NP      . mometasone-formoterol [facility]) 200-5 MCG/ACT inhaler 2 puff  2 puff Inhalation BID Lang Snow, NP   2 puff at 10/26/18 0853  . pantoprazole (PROTONIX) 80 mg in sodium chloride 0.9 % 250 mL (0.32 mg/mL) infusion  8 mg/hr Intravenous Continuous Lang Snow, NP 25 mL/hr at 10/26/18 1011 8 mg/hr at 10/26/18 1011  . [START ON 10/29/2018] pantoprazole (PROTONIX) injection 40 mg  40 mg Intravenous Q12H Ouma, Bing Neighbors, NP      . pregabalin (LYRICA) capsule 150 mg  150 mg Oral BID Lang Snow, NP   150 mg at 10/26/18 0852  . spironolactone (ALDACTONE) tablet 12.5 mg  12.5 mg Oral Daily Lang Snow, NP   12.5 mg at 10/26/18 6067     Discharge Medications: Please see discharge summary for a list of discharge medications.  Relevant Imaging Results:  Relevant Lab Results:   Additional Information [id number]  Su Hilt, RN

## 2018-10-26 NOTE — ED Notes (Signed)
Per IV team, pt refusing to let Iv team attempt another IV on pt.

## 2018-10-26 NOTE — ED Notes (Signed)
Pt sleeping, NAD. 2l [place] on pt.

## 2018-10-26 NOTE — Progress Notes (Signed)
Pharmacy Antibiotic Note  [name] is a 63 y.o. female admitted on 10/25/2018 with sepsis.  Pharmacy has been consulted for vanc/cefepime/flagyl dosing.  Plan: Patient vanc 2g IV load  Patient is in AKI and getting worse per this morning's renal function Scr 1.85 >> 2.27. Will dose vanc per random levels due to AKI. Goal random < 20 mcg/mL. Will check vanc random this am, and will redose w/ vanc 15 mg/kg IV x 1 if random appropriate.  Will continue cefepime 2g IV q12h per CrCl 30 - 60 ml/min and flagyl 500 mg IV q8h, will continue to monitor renal function and s/sx of infection.  Height: 5' 8"  (172.7 cm) Weight: 275 lb 12.7 oz (125.1 kg) IBW/kg (Calculated) : 63.9  Temp (24hrs), Avg:98.8 F (37.1 C), Min:98.2 F (36.8 C), Max:100 F (37.8 C)  Recent Labs  Lab 10/25/18 2038 10/25/18 2211 10/26/18 0500  WBC 14.0*  --  15.1*  CREATININE 1.85*  --  2.27*  LATICACIDVEN 6.1* 3.9*  --     Estimated Creatinine Clearance: 35.4 mL/min (A) (by C-G formula based on SCr of 2.27 mg/dL (H)).    Allergies  Allergen Reactions  . Latex Anaphylaxis and Rash  . Gabapentin Nausea And Vomiting and Other (See Comments)   Thank you for allowing pharmacy to be a part of this patient's care.  Tobie Lords, PharmD, BCPS Clinical Pharmacist 10/26/2018 7:05 AM

## 2018-10-27 ENCOUNTER — Inpatient Hospital Stay: Payer: Medicaid Other

## 2018-10-27 DIAGNOSIS — I2510 Atherosclerotic heart disease of native coronary artery without angina pectoris: Secondary | ICD-10-CM

## 2018-10-27 DIAGNOSIS — D696 Thrombocytopenia, unspecified: Secondary | ICD-10-CM

## 2018-10-27 DIAGNOSIS — F17210 Nicotine dependence, cigarettes, uncomplicated: Secondary | ICD-10-CM

## 2018-10-27 DIAGNOSIS — N189 Chronic kidney disease, unspecified: Secondary | ICD-10-CM

## 2018-10-27 DIAGNOSIS — Z8619 Personal history of other infectious and parasitic diseases: Secondary | ICD-10-CM

## 2018-10-27 DIAGNOSIS — I255 Ischemic cardiomyopathy: Secondary | ICD-10-CM

## 2018-10-27 DIAGNOSIS — Z89412 Acquired absence of left great toe: Secondary | ICD-10-CM

## 2018-10-27 DIAGNOSIS — Z9889 Other specified postprocedural states: Secondary | ICD-10-CM

## 2018-10-27 DIAGNOSIS — Z888 Allergy status to other drugs, medicaments and biological substances status: Secondary | ICD-10-CM

## 2018-10-27 DIAGNOSIS — E1142 Type 2 diabetes mellitus with diabetic polyneuropathy: Secondary | ICD-10-CM

## 2018-10-27 DIAGNOSIS — Z951 Presence of aortocoronary bypass graft: Secondary | ICD-10-CM

## 2018-10-27 DIAGNOSIS — Z91040 Latex allergy status: Secondary | ICD-10-CM

## 2018-10-27 DIAGNOSIS — L89329 Pressure ulcer of left buttock, unspecified stage: Secondary | ICD-10-CM

## 2018-10-27 DIAGNOSIS — K7460 Unspecified cirrhosis of liver: Secondary | ICD-10-CM

## 2018-10-27 DIAGNOSIS — Q2733 Arteriovenous malformation of digestive system vessel: Secondary | ICD-10-CM

## 2018-10-27 DIAGNOSIS — B954 Other streptococcus as the cause of diseases classified elsewhere: Secondary | ICD-10-CM

## 2018-10-27 DIAGNOSIS — R7881 Bacteremia: Secondary | ICD-10-CM

## 2018-10-27 DIAGNOSIS — I130 Hypertensive heart and chronic kidney disease with heart failure and stage 1 through stage 4 chronic kidney disease, or unspecified chronic kidney disease: Secondary | ICD-10-CM

## 2018-10-27 DIAGNOSIS — E1122 Type 2 diabetes mellitus with diabetic chronic kidney disease: Secondary | ICD-10-CM

## 2018-10-27 DIAGNOSIS — D500 Iron deficiency anemia secondary to blood loss (chronic): Secondary | ICD-10-CM

## 2018-10-27 DIAGNOSIS — Z952 Presence of prosthetic heart valve: Secondary | ICD-10-CM

## 2018-10-27 DIAGNOSIS — Z794 Long term (current) use of insulin: Secondary | ICD-10-CM

## 2018-10-27 DIAGNOSIS — I5033 Acute on chronic diastolic (congestive) heart failure: Secondary | ICD-10-CM

## 2018-10-27 DIAGNOSIS — N179 Acute kidney failure, unspecified: Secondary | ICD-10-CM

## 2018-10-27 DIAGNOSIS — Z8719 Personal history of other diseases of the digestive system: Secondary | ICD-10-CM

## 2018-10-27 DIAGNOSIS — R21 Rash and other nonspecific skin eruption: Secondary | ICD-10-CM

## 2018-10-27 DIAGNOSIS — R011 Cardiac murmur, unspecified: Secondary | ICD-10-CM

## 2018-10-27 LAB — CBC WITH DIFFERENTIAL/PLATELET
Abs Immature Granulocytes: 0.11 10*3/uL — ABNORMAL HIGH (ref 0.00–0.07)
Basophils Absolute: 0.1 10*3/uL (ref 0.0–0.1)
Basophils Relative: 0 %
Eosinophils Absolute: 0.1 10*3/uL (ref 0.0–0.5)
Eosinophils Relative: 1 %
HCT: 22.1 % — ABNORMAL LOW (ref 36.0–46.0)
Hemoglobin: 6.6 g/dL — ABNORMAL LOW (ref 12.0–15.0)
Immature Granulocytes: 1 %
Lymphocytes Relative: 4 %
Lymphs Abs: 0.6 10*3/uL — ABNORMAL LOW (ref 0.7–4.0)
MCH: 23.2 pg — ABNORMAL LOW (ref 26.0–34.0)
MCHC: 29.9 g/dL — ABNORMAL LOW (ref 30.0–36.0)
MCV: 77.8 fL — ABNORMAL LOW (ref 80.0–100.0)
Monocytes Absolute: 1.3 10*3/uL — ABNORMAL HIGH (ref 0.1–1.0)
Monocytes Relative: 7 %
Neutro Abs: 15.7 10*3/uL — ABNORMAL HIGH (ref 1.7–7.7)
Neutrophils Relative %: 87 %
Platelets: 160 10*3/uL (ref 150–400)
RBC: 2.84 MIL/uL — ABNORMAL LOW (ref 3.87–5.11)
RDW: 20.7 % — ABNORMAL HIGH (ref 11.5–15.5)
WBC: 17.9 10*3/uL — ABNORMAL HIGH (ref 4.0–10.5)
nRBC: 0.2 % (ref 0.0–0.2)

## 2018-10-27 LAB — COMPREHENSIVE METABOLIC PANEL
ALT: 9 U/L (ref 0–44)
AST: 17 U/L (ref 15–41)
Albumin: 2.2 g/dL — ABNORMAL LOW (ref 3.5–5.0)
Alkaline Phosphatase: 74 U/L (ref 38–126)
Anion gap: 7 (ref 5–15)
BUN: 33 mg/dL — ABNORMAL HIGH (ref 8–23)
CO2: 19 mmol/L — ABNORMAL LOW (ref 22–32)
Calcium: 7.9 mg/dL — ABNORMAL LOW (ref 8.9–10.3)
Chloride: 109 mmol/L (ref 98–111)
Creatinine, Ser: 2.95 mg/dL — ABNORMAL HIGH (ref 0.44–1.00)
GFR calc Af Amer: 19 mL/min — ABNORMAL LOW (ref >60)
GFR calc non Af Amer: 16 mL/min — ABNORMAL LOW (ref >60)
Glucose, Bld: 98 mg/dL (ref 70–99)
Potassium: 4.8 mmol/L (ref 3.5–5.1)
Sodium: 135 mmol/L (ref 135–145)
Total Bilirubin: 2.5 mg/dL — ABNORMAL HIGH (ref 0.3–1.2)
Total Protein: 7 g/dL (ref 6.5–8.1)

## 2018-10-27 LAB — GLUCOSE, CAPILLARY
Glucose-Capillary: 106 mg/dL — ABNORMAL HIGH (ref 70–99)
Glucose-Capillary: 120 mg/dL — ABNORMAL HIGH (ref 70–99)
Glucose-Capillary: 116 mg/dL — ABNORMAL HIGH (ref 70–99)
Glucose-Capillary: 129 mg/dL — ABNORMAL HIGH (ref 70–99)

## 2018-10-27 LAB — BILIRUBIN, DIRECT: Bilirubin, Direct: 1.4 mg/dL — ABNORMAL HIGH (ref 0.0–0.2)

## 2018-10-27 LAB — PREPARE RBC (CROSSMATCH)

## 2018-10-27 MED ORDER — MAGNESIUM CITRATE PO SOLN
1.0000 | Freq: Once | ORAL | Status: DC
  Filled 2018-10-27: qty 296

## 2018-10-27 MED ORDER — ALBUMIN HUMAN 25 % IV SOLN
12.5000 g | Freq: Every day | INTRAVENOUS | Status: DC
  Administered 2018-10-28 – 2018-10-30 (×3): 12.5 g via INTRAVENOUS
  Filled 2018-10-27 (×3): qty 50

## 2018-10-27 MED ORDER — MIDODRINE HCL 5 MG PO TABS
5.0000 mg | ORAL_TABLET | Freq: Three times a day (TID) | ORAL | Status: DC
  Administered 2018-10-27 – 2018-11-01 (×11): 5 mg via ORAL
  Filled 2018-10-27 (×13): qty 1

## 2018-10-27 MED ORDER — SODIUM CHLORIDE 0.9 % IV SOLN
300.0000 mg | Freq: Once | INTRAVENOUS | Status: AC
  Administered 2018-10-28: 300 mg via INTRAVENOUS
  Filled 2018-10-27: qty 15

## 2018-10-27 MED ORDER — SODIUM CHLORIDE 0.9% IV SOLUTION: Freq: Once | INTRAVENOUS | Status: DC

## 2018-10-27 NOTE — Consult Note (Addendum)
NAME: [name]  DOB: 1955-07-18  MRN: [id number]  Date/Time: 10/27/2018 5:34 PM  REQUESTING PROVIDER: Dr. Brigitte Pulse Subjective:  REASON FOR CONSULT: Bacteremia ? [name] is a 63 y.o. 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  Patient was sitting on the toilet and she attempted to get up and fell.  She did not pass out.  EMS checked her pulse ox and it was 88% and she was put on nonrebreather 15 L.  Patient has had a cough for the last few days.  Not productive.  She is feeling weak overall. In the ED her vitals were temperature of 100, blood pressure of 124/59, heart rate of 117, pulse ox of 96% and respiratory rate of 26. Labs revealed creatinine of 1.85, WBC of 14, hemoglobin of 5.6, and platelet of 182.  Lactate was 6.1, pro calcitonin of 0.38, blood cultures were sent and she was started on broad-spectrum antibiotic vancomycin and cefepime. Blood culture returned positive for Streptococcus group C the antibiotic was de-escalated to ceftriaxone. She is also received 3 units of blood so far.  Today she is getting another unit.   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 cardiac MRI done at [facility] on 02/09/18 r/o infiltrative pathology MR TR Acute on chronic diastolic CHF Coronary artery disease Chronic hepatitis C-treated in Sept 2016 with HArvoni- Dec 2016 NEG PCR Hyperlipidemia Diabetes mellitus type 2 Diabetic polyneuropathy Hypertension GERD Chronic back pain Anxiety/insomnia Carpal tunnel syndrome COPD Cervical pain with radiculopathy affecting upper extremity Sleep apnea Cervical cancer Left great toe gangrene, 4th toe Osteomyelitis left great toe    surgical history Amputation  left great toe MTPJ 06/24/18 CABG Appendectomy ORIF ankle fracture right Carpal tunnel repair Tubal ligation Cholecystectomy Coronary angiography  Social history  non-smoker No alcohol   Social History   Socioeconomic History  . Marital status: Married    Spouse name: Not on file  . Number of children: Not on file  . Years of education: Not on file  . Highest education level: Not on file  Occupational History  . Not on file  Social Needs  . Financial resource strain: Not on file  . Food insecurity    Worry: Not on file    Inability: Not on file  . Transportation needs    Medical: Not on file    Non-medical: Not on file  Tobacco Use  . Smoking status: Current Some Day Smoker    Packs/day: 0.25    Types: Cigarettes  . Smokeless tobacco: Never Used  Substance and Sexual Activity  . Alcohol use: No  . Drug use: No  . Sexual activity: Not on file  Lifestyle  . Physical activity    Days per week: Not on file    Minutes per session: Not on file  . Stress: Not on file  Relationships  . Social Herbalist on phone: Not on file    Gets together: Not on file    Attends religious service: Not on file    Active member of club or organization: Not on file    Attends meetings of clubs or organizations: Not on file    Relationship status: Not on file  . Intimate partner violence    Fear  of current or ex partner: Not on file    Emotionally abused: Not on file    Physically abused: Not on file    Forced sexual activity: Not on file  Other Topics Concern  . Not on file  Social History Narrative  . Not on file    Family History  Problem Relation Age of Onset  . Hypertension Mother   . Diabetes Mellitus II Mother   . Breast cancer Mother   . Heart disease Father   . Cancer Sister    Allergies  Allergen Reactions  . Latex Anaphylaxis and Rash  . Gabapentin Nausea And Vomiting and Other (See Comments)    Current Facility-Administered Medications   Medication Dose Route Frequency Provider Last Rate Last Dose  . 0.9 %  sodium chloride infusion (Manually program via Guardrails IV Fluids)   Intravenous Once Manuella Ghazi, Vipul, MD      . 0.9 %  sodium chloride infusion   Intravenous PRN Lang Snow, NP 130 mL/hr at 10/26/18 1500    . acetaminophen (TYLENOL) tablet 650 mg  650 mg Oral Q6H PRN Gouru, Aruna, MD   650 mg at 10/27/18 0220  . [START ON 10/28/2018] albumin human 25 % solution 12.5 g  12.5 g Intravenous Daily Singh, Harmeet, MD      . albuterol (PROVENTIL) (2.5 MG/3ML) 0.083% nebulizer solution 2.5-5 mg  2.5-5 mg Inhalation Q4H PRN Lang Snow, NP      . atorvastatin (LIPITOR) tablet 40 mg  40 mg Oral Daily Lang Snow, NP   40 mg at 10/27/18 1717  . cefTRIAXone (ROCEPHIN) 2 g in sodium chloride 0.9 % 100 mL IVPB  2 g Intravenous Q24H Gouru, Aruna, MD 200 mL/hr at 10/27/18 1331 2 g at 10/27/18 1331  . Chlorhexidine Gluconate Cloth 2 % PADS 6 each  6 each Topical Q0600 Gouru, Aruna, MD   6 each at 10/27/18 0600  . fluticasone (FLONASE) 50 MCG/ACT nasal spray 2 spray  2 spray Each Nare Daily Lang Snow, NP   2 spray at 10/27/18 [phone]  . folic acid (FOLVITE) tablet 1 mg  1 mg Oral Daily Lang Snow, NP   1 mg at 10/27/18 0951  . insulin aspart (novoLOG) injection 0-5 Units  0-5 Units Subcutaneous QHS Lang Snow, NP   Stopped at 10/26/18 [phone]  . insulin aspart (novoLOG) injection 0-9 Units  0-9 Units Subcutaneous TID WC Lang Snow, NP   1 Units at 10/26/18 1725  . insulin glargine (LANTUS) injection 16 Units  16 Units Subcutaneous QHS Lang Snow, NP   16 Units at 10/26/18 2221  . [START ON 10/28/2018] iron sucrose (VENOFER) 300 mg in sodium chloride 0.9 % 250 mL IVPB  300 mg Intravenous Once Vanga, Tally Due, MD      . magnesium citrate solution 1 Bottle  1 Bottle Oral Once Vanga, Tally Due, MD      . midodrine (PROAMATINE) tablet 5 mg  5 mg Oral  TID WC Murlean Iba, MD   5 mg at 10/27/18 1717  . mometasone-formoterol (DULERA) 200-5 MCG/ACT inhaler 2 puff  2 puff Inhalation BID Lang Snow, NP   2 puff at 10/26/18 2005  . mupirocin ointment (BACTROBAN) 2 % 1 application  1 application Nasal BID Nicholes Mango, MD   1 application at 09/98/33 1337  . [START ON 10/29/2018] pantoprazole (PROTONIX) injection 40 mg  40 mg Intravenous Q12H Lang Snow, NP      .  pregabalin (LYRICA) capsule 150 mg  150 mg Oral BID Lang Snow, NP   150 mg at 10/27/18 7824     Abtx:  Anti-infectives (From admission, onward)   Start     Dose/Rate Route Frequency Ordered Stop   10/26/18 1100  cefTRIAXone (ROCEPHIN) 2 g in sodium chloride 0.9 % 100 mL IVPB     2 g 200 mL/hr over 30 Minutes Intravenous Every 24 hours 10/26/18 1019     10/26/18 1000  ceFEPIme (MAXIPIME) 2 g in sodium chloride 0.9 % 100 mL IVPB  Status:  Discontinued     2 g 200 mL/hr over 30 Minutes Intravenous Every 12 hours 10/26/18 0307 10/26/18 1019   10/25/18 2230  vancomycin (VANCOCIN) 2,000 mg in sodium chloride 0.9 % 500 mL IVPB     2,000 mg 250 mL/hr over 120 Minutes Intravenous  Once 10/25/18 2217 10/26/18 0303   10/25/18 2215  ceFEPIme (MAXIPIME) 2 g in sodium chloride 0.9 % 100 mL IVPB     2 g 200 mL/hr over 30 Minutes Intravenous  Once 10/25/18 2204 10/25/18 2319   10/25/18 2215  vancomycin (VANCOCIN) IVPB 1000 mg/200 mL premix  Status:  Discontinued     1,000 mg 200 mL/hr over 60 Minutes Intravenous  Once 10/25/18 2204 10/25/18 2212   10/25/18 2215  vancomycin (VANCOCIN) 2,500 mg in sodium chloride 0.9 % 500 mL IVPB  Status:  Discontinued     2,500 mg 250 mL/hr over 120 Minutes Intravenous  Once 10/25/18 2213 10/25/18 2217      REVIEW OF SYSTEMS:  Const: negative fever, negative chills, negative weight loss Eyes: negative diplopia or visual changes, negative eye pain ENT: negative coryza, negative sore throat Resp:positive cough, no  hemoptysis, has dyspnea Cards: negative for chest pain, palpitations, lower extremity edema GU: negative for frequency, dysuria and hematuria GI: Negative for abdominal pain, diarrhea, bleeding, constipation Skin: negative for rash and pruritus Heme: negative for easy bruising and gum/nose bleeding MS: Generalized weakness, poor mobility Neurolo:negative for headaches, dizziness, vertigo, memory problems  Psych: negative for feelings of anxiety, depression  Endocrine: Has diabetes Allergy/Immunology-as noted above Objective:  VITALS:  BP 132/72   Pulse 87   Temp 98.2 F (36.8 C) (Oral)   Resp 20   Ht 5' 8"  (1.727 m)   Wt 125.1 kg   SpO2 99%   BMI 41.93 kg/m  PHYSICAL EXAM:  General: Awake , cooperative, lethargic obese Head: Normocephalic, without obvious abnormality, atraumatic. Eyes: Conjunctivae clear, anicteric sclerae. Pupils are equal ENT Nares normal. No drainage or sinus tenderness. Lips, mucosa, and tongue normal. No Thrush Neck: Supple, symmetrical, no adenopathy, thyroid: non tender no carotid bruit and no JVD. Back: small ulcer on the left buttock area    Lungs: Bilateral air entry Heart: M3-N3 systolic murmur. Abdomen: Soft, non-tender,not distended. Bowel sounds normal. No masses Extremities: Edema legs.  Bilateral upper arm near cubital area there is a maculopapular rash     Not present elsewhere. Lower extremities left great toe amputation site coapted very well just some darkening of the skin.      Skin: as above Lymph: Cervical, supraclavicular normal. Neurologic: Grossly non-focal Pertinent Labs Lab Results CBC    Component Value Date/Time   WBC 17.9 (H) 10/27/2018 0350   RBC 2.84 (L) 10/27/2018 0350   HGB 6.6 (L) 10/27/2018 0350   HGB 7.3 (L) 11/23/2017 1454   HCT 22.1 (L) 10/27/2018 0350   HCT 45.7 08/03/2013 2138   PLT 160 10/27/2018  0350   PLT 118 (L) 08/03/2013 2138   MCV 77.8 (L) 10/27/2018 0350   MCV 89 08/03/2013 2138   MCH  23.2 (L) 10/27/2018 0350   MCHC 29.9 (L) 10/27/2018 0350   RDW 20.7 (H) 10/27/2018 0350   RDW 13.6 08/03/2013 2138   LYMPHSABS 0.6 (L) 10/27/2018 0350   LYMPHSABS 3.4 08/03/2013 2138   MONOABS 1.3 (H) 10/27/2018 0350   MONOABS 0.7 08/03/2013 2138   EOSABS 0.1 10/27/2018 0350   EOSABS 0.3 08/03/2013 2138   BASOSABS 0.1 10/27/2018 0350   BASOSABS 0.1 08/03/2013 2138    CMP Latest Ref Rng & Units 10/27/2018 10/26/2018 10/25/2018  Glucose 70 - 99 mg/dL 98 183(H) 191(H)  BUN 8 - 23 mg/dL 33(H) 26(H) 23  Creatinine 0.44 - 1.00 mg/dL 2.95(H) 2.27(H) 1.85(H)  Sodium 135 - 145 mmol/L 135 137 135  Potassium 3.5 - 5.1 mmol/L 4.8 4.7 4.3  Chloride 98 - 111 mmol/L 109 110 109  CO2 22 - 32 mmol/L 19(L) 16(L) 15(L)  Calcium 8.9 - 10.3 mg/dL 7.9(L) 7.8(L) 8.0(L)  Total Protein 6.5 - 8.1 g/dL 7.0 - -  Total Bilirubin 0.3 - 1.2 mg/dL 2.5(H) - -  Alkaline Phos 38 - 126 U/L 74 - -  AST 15 - 41 U/L 17 - -  ALT 0 - 44 U/L 9 - -      Microbiology: Recent Results (from the past 240 hour(s))  Blood culture (routine x 2)     Status: Abnormal (Preliminary result)   Collection Time: 10/25/18  8:38 PM   Specimen: BLOOD  Result Value Ref Range Status   Specimen Description   Final    BLOOD LEFT ANTECUBITAL Performed at [facility], [address] [place], [place] [address]    Special Requests   Final    BOTTLES DRAWN AEROBIC AND ANAEROBIC Blood Culture results may not be optimal due to an excessive volume of blood received in culture bottles Performed at [facility], [address], [place], [place] [address]    Culture  Setup Time   Final    GRAM POSITIVE COCCI IN BOTH AEROBIC AND ANAEROBIC BOTTLES CRITICAL RESULT CALLED TO, READ BACK BY AND VERIFIED WITH: CHARLES SHANLEVER @0835  ON 10/26/2018 BY FMW Performed at [facility], [place] [address], [place], [place] [address]    Culture STREPTOCOCCUS GROUP C (A)  Final   Report Status PENDING  Incomplete  Blood culture  (routine x 2)     Status: Abnormal (Preliminary result)   Collection Time: 10/25/18  8:38 PM   Specimen: BLOOD  Result Value Ref Range Status   Specimen Description   Final    BLOOD RIGHT ANTECUBITAL Performed at [facility], [address] [place], [place] [address]    Special Requests   Final    BOTTLES DRAWN AEROBIC AND ANAEROBIC Blood Culture results may not be optimal due to an excessive volume of blood received in culture bottles Performed at [facility], [address], [place], [place] [address]    Culture  Setup Time   Final    GRAM POSITIVE COCCI IN BOTH AEROBIC AND ANAEROBIC BOTTLES CRITICAL VALUE NOTED.  VALUE IS CONSISTENT WITH PREVIOUSLY REPORTED AND CALLED VALUE. Performed at [facility], [place],  [address]    Culture STREPTOCOCCUS GROUP C (A)  Final   Report Status PENDING  Incomplete  Blood Culture ID Panel (Reflexed)     Status: Abnormal   Collection Time:  10/25/18  8:38 PM  Result Value Ref Range Status   Enterococcus species NOT DETECTED NOT DETECTED Final   Listeria monocytogenes NOT DETECTED NOT DETECTED Final   Staphylococcus species NOT DETECTED NOT DETECTED Final   Staphylococcus aureus (BCID) NOT DETECTED NOT DETECTED Final   Streptococcus species DETECTED (A) NOT DETECTED Final    Comment: Not Enterococcus species, Streptococcus agalactiae, Streptococcus pyogenes, or Streptococcus pneumoniae. CRITICAL RESULT CALLED TO, READ BACK BY AND VERIFIED WITH: CHARLES SHANLEVER @0835  ON 10/26/2018 BY FMW    Streptococcus agalactiae NOT DETECTED NOT DETECTED Final   Streptococcus pneumoniae NOT DETECTED NOT DETECTED Final   Streptococcus pyogenes NOT DETECTED NOT DETECTED Final   Acinetobacter baumannii NOT DETECTED NOT DETECTED Final   Enterobacteriaceae species NOT DETECTED NOT DETECTED Final   Enterobacter cloacae complex NOT DETECTED NOT DETECTED Final   Escherichia coli NOT DETECTED NOT DETECTED Final    Klebsiella oxytoca NOT DETECTED NOT DETECTED Final   Klebsiella pneumoniae NOT DETECTED NOT DETECTED Final   Proteus species NOT DETECTED NOT DETECTED Final   Serratia marcescens NOT DETECTED NOT DETECTED Final   Haemophilus influenzae NOT DETECTED NOT DETECTED Final   Neisseria meningitidis NOT DETECTED NOT DETECTED Final   Pseudomonas aeruginosa NOT DETECTED NOT DETECTED Final   Candida albicans NOT DETECTED NOT DETECTED Final   Candida glabrata NOT DETECTED NOT DETECTED Final   Candida krusei NOT DETECTED NOT DETECTED Final   Candida parapsilosis NOT DETECTED NOT DETECTED Final   Candida tropicalis NOT DETECTED NOT DETECTED Final    Comment: Performed at [facility], [address] [place], [place] [address]  SARS Coronavirus 2 by RT PCR (hospital order, performed in [facility] hospital lab) Nasopharyngeal Nasopharyngeal Swab     Status: None   Collection Time: 10/25/18  8:49 PM   Specimen: Nasopharyngeal Swab  Result Value Ref Range Status   SARS Coronavirus 2 NEGATIVE NEGATIVE Final    Comment: (NOTE) If result is NEGATIVE SARS-CoV-2 target nucleic acids are NOT DETECTED. The SARS-CoV-2 RNA is generally detectable in upper and lower  respiratory specimens during the acute phase of infection. The lowest  concentration of SARS-CoV-2 viral copies this assay can detect is 250  copies / mL. A negative result does not preclude SARS-CoV-2 infection  and should not be used as the sole basis for treatment or other  patient management decisions.  A negative result may occur with  improper specimen collection / handling, submission of specimen other  than nasopharyngeal swab, presence of viral mutation(s) within the  areas targeted by this assay, and inadequate number of viral copies  (<250 copies / mL). A negative result must be combined with clinical  observations, patient history, and epidemiological information. If result is POSITIVE SARS-CoV-2 target nucleic acids are  DETECTED. The SARS-CoV-2 RNA is generally detectable in upper and lower  respiratory specimens dur ing the acute phase of infection.  Positive  results are indicative of active infection with SARS-CoV-2.  Clinical  correlation with patient history and other diagnostic information is  necessary to determine patient infection status.  Positive results do  not rule out bacterial infection or co-infection with other viruses. If result is PRESUMPTIVE POSTIVE SARS-CoV-2 nucleic acids MAY BE PRESENT.   A presumptive positive result was obtained on the submitted specimen  and confirmed on repeat testing.  While 2019 novel coronavirus  (SARS-CoV-2) nucleic acids may be present in the submitted sample  additional confirmatory testing may be necessary for epidemiological  and / or  clinical management purposes  to differentiate between  SARS-CoV-2 and other Sarbecovirus currently known to infect humans.  If clinically indicated additional testing with an alternate test  methodology [phone]) is advised. The SARS-CoV-2 RNA is generally  detectable in upper and lower respiratory sp ecimens during the acute  phase of infection. The expected result is Negative. Fact Sheet for Patients:  StrictlyIdeas.no Fact Sheet for Healthcare Providers: BankingDealers.co.za This test is not yet approved or cleared by the [place] FDA and has been authorized for detection and/or diagnosis of SARS-CoV-2 by FDA under an Emergency Use Authorization (EUA).  This EUA will remain in effect (meaning this test can be used) for the duration of the COVID-19 declaration under Section 564(b)(1) of the Act, 21 U.S.C. section 360bbb-3(b)(1), unless the authorization is terminated or revoked sooner. Performed at [facility], [address] [place], [place] [address]   MRSA PCR Screening     Status: Abnormal   Collection Time: 10/26/18 11:09 AM   Specimen:  Nasopharyngeal  Result Value Ref Range Status   MRSA by PCR POSITIVE (A) NEGATIVE Final    Comment:        The GeneXpert MRSA Assay (FDA approved for NASAL specimens only), is one component of a comprehensive MRSA colonization surveillance program. It is not intended to diagnose MRSA infection nor to guide or monitor treatment for MRSA infections. RESULT CALLED TO, READ BACK BY AND VERIFIED WITH: EMILY GANNON AT 1630 10/26/2018.PMF Performed at [facility], [address] [place], [place] [address]     IMAGING RESULTS:  I have personally reviewed the films ? Impression/Recommendation ? ?Weakness and fall.  Related to the severe anemia as well as bacteremia.   Severe anemia likely result of GI bleed.  Had a similar episode in the past. Receiving PrBC  Streptococcus C bacteremia.  Source unclear. Could be a GI source especially with severe anemia and history of GI bleed in the past but none this time.  concern  that she has a bioprosthetic valve and risk for endocarditis. Schistocytes seen in the peripheral smear.  Need to rule out hemolysis.  Need to rule out valvular vegetation.  AKI on CKD- Nephrology following her  CHF  Rash on arms near cubital fosa area- unclear etiology ? Pressure from BP cuff- - watch closely for progression ? _Diabetes on insulin  Compensated cirrhosis with  mild thrombocytopenia. Patient is followed by GI.   Treated HEPC with harvoni in 2016  __________________________________________________ Discussed with patient, and her nurse Note:  This document was prepared using Dragon voice recognition software and may include unintentional dictation errors.

## 2018-10-27 NOTE — Consult Note (Addendum)
[name], MD [address]  [place]  [place], [place] [address]  Main: [phone]  Fax: [phone] Pager: [phone]   Consultation  Referring Provider:     No ref. provider found Primary Care Physician:  [place] Primary Gastroenterologist: [facility]     Reason for Consultation:     Symptomatic anemia  Date of Admission:  10/25/2018 Date of Consultation:  10/27/2018         HPI:   [name] is a 63 y.o. female with known history of chronic iron deficiency anemia, well-known to GI service from several admissions in the past, who underwent several endoscopic evaluations including EGD, small bowel enteroscopy as well as colonoscopy for obscure GI bleed.  Patient has history of noncompliance, not on oral iron replacement as well as does not follow-up with GI as outpatient.  Patient is readmitted this time on 10/8 with symptomatic anemia, hemoglobin was 5.6 on admission, 6.6 today.  Patient received 2 units of PRBCs on 10/9, 1 unit of PRBCs today due to low hemoglobin.  Upon repeated asking, patient persistently denied noticing black stools, rectal bleeding, hematemesis, melena, abdominal pain, nausea or vomiting.  She was eating lunch when I saw her.  She denies taking her medications regularly.  She denies NSAID use  Patient is found to have group C streptococcus bacteremia on admission, currently treated with ceftriaxone  Most recent admission in 8/20 secondary to acute on chronic symptomatic anemia, underwent small bowel endoscopy as well as a colonoscopy which was unremarkable  She also has mild intermittent thrombocytopenia, limited active Crohn's ultrasound during this admission revealed nodularity of the liver  NSAIDs: None  Antiplts/Anticoagulants/Anti thrombotics: None  GI Procedures: Review under procedures tab  Past Medical History:  Diagnosis Date   Cervical cancer ([place])    CHF (congestive heart failure) ([facility])     Chronic anemia    COPD (chronic obstructive pulmonary disease) ([place])    Diabetes mellitus without complication ([place])    Gastric ulcer    NSTEMI (non-ST elevated myocardial infarction) ([place])    Upper GI bleed     Past Surgical History:  Procedure Laterality Date   AMPUTATION Left 06/24/2018   Procedure: AMPUTATION GREAT TOE;  Surgeon: Samara Deist, DPM;  Location: [facility] ORS;  Service: Podiatry;  Laterality: Left;   APPENDECTOMY     CARDIAC CATHETERIZATION     CHOLECYSTECTOMY     COLONOSCOPY N/A 09/28/2017   Procedure: COLONOSCOPY;  Surgeon: Toledo, Benay Pike, MD;  Location: [facility] ENDOSCOPY;  Service: Gastroenterology;  Laterality: N/A;   COLONOSCOPY WITH PROPOFOL N/A 08/21/2018   Procedure: COLONOSCOPY WITH PROPOFOL;  Surgeon: Lin Landsman, MD;  Location: [facility] ENDOSCOPY;  Service: Gastroenterology;  Laterality: N/A;   ENTEROSCOPY Left 11/06/2017   Procedure: ENTEROSCOPY;  Surgeon: Jonathon Bellows, MD;  Location: [facility] ENDOSCOPY;  Service: Gastroenterology;  Laterality: Left;   ENTEROSCOPY N/A 06/22/2018   Procedure: ENTEROSCOPY;  Surgeon: Lin Landsman, MD;  Location: [facility] ENDOSCOPY;  Service: Gastroenterology;  Laterality: N/A;   ENTEROSCOPY N/A 08/18/2018   Procedure: ENTEROSCOPY;  Surgeon: Jonathon Bellows, MD;  Location: [facility] ENDOSCOPY;  Service: Gastroenterology;  Laterality: N/A;   ESOPHAGOGASTRODUODENOSCOPY N/A 12/19/2016   Procedure: ESOPHAGOGASTRODUODENOSCOPY (EGD);  Surgeon: Lin Landsman, MD;  Location: [facility] ENDOSCOPY;  Service: Gastroenterology;  Laterality: N/A;   ESOPHAGOGASTRODUODENOSCOPY N/A 09/28/2017   Procedure: ESOPHAGOGASTRODUODENOSCOPY (EGD);  Surgeon: Toledo, Benay Pike, MD;  Location: [facility] ENDOSCOPY;  Service: Gastroenterology;  Laterality: N/A;   ESOPHAGOGASTRODUODENOSCOPY (  EGD) WITH PROPOFOL N/A 08/08/2017   Procedure: ESOPHAGOGASTRODUODENOSCOPY (EGD) WITH PROPOFOL;  Surgeon: [name], MD;  Location: [facility] ENDOSCOPY;  Service: Endoscopy;   Laterality: N/A;   heart [place]     LEFT HEART CATH AND CORONARY ANGIOGRAPHY N/A 12/23/2016   Procedure: LEFT HEART CATH AND CORONARY ANGIOGRAPHY;  Surgeon: Teodoro Spray, MD;  Location: [place] CV LAB;  Service: Cardiovascular;  Laterality: N/A;   LOWER EXTREMITY ANGIOGRAPHY Left 06/26/2018   Procedure: Lower Extremity Angiography;  Surgeon: Katha Cabal, MD;  Location: [place] CV LAB;  Service: Cardiovascular;  Laterality: Left;    Prior to Admission medications   Medication Sig Start Date End Date Taking? Authorizing Provider  atorvastatin (LIPITOR) 40 MG tablet Take 40 mg by mouth daily.   Yes [provider]  carvedilol (COREG) 3.125 MG tablet Take 1 tablet (3.125 mg total) by mouth 2 (two) times daily with a meal. 08/21/18  Yes Ojie, Jude, MD  ferrous sulfate 325 (65 FE) MG tablet Take 1 tablet (325 mg total) by mouth 2 (two) times daily with a meal. 08/21/18  Yes Ojie, Jude, MD  fluticasone (FLONASE) 50 MCG/ACT nasal spray Place 2 sprays into both nostrils daily.   Yes [provider]  folic acid (FOLVITE) 1 MG tablet Take 1 tablet (1 mg total) by mouth daily. 08/21/18  Yes Ojie, Jude, MD  furosemide (LASIX) 40 MG tablet Take 1 tablet (40 mg total) by mouth daily. Patient taking differently: Take 40 mg by mouth daily as needed. Take if weight is increased (Per instructions) 01/16/18 01/16/19 Yes Gladstone Lighter, MD  Insulin Glargine (LANTUS SOLOSTAR) 100 UNIT/ML Solostar Pen Inject 15 Units into the skin daily. Patient taking differently: Inject 16 Units into the skin at bedtime.  08/29/17  Yes Dustin Flock, MD  lisinopril (ZESTRIL) 5 MG tablet Take 5 mg by mouth daily.   Yes [provider]  omeprazole (PRILOSEC) 40 MG capsule Take 1 capsule (40 mg total) by mouth 2 (two) times a day. 08/15/18 10/25/18 Yes Sherill Wegener, Tally Due, MD  pantoprazole (PROTONIX) 40 MG tablet Take 40 mg by mouth 2 (two) times daily.   Yes [provider]  pregabalin (LYRICA) 150 MG capsule Take 150 mg by mouth 2 (two) times daily. 08/03/18  Yes [provider]  PROAIR HFA 108 (90 Base) MCG/ACT inhaler Inhale 1-2 puffs into the lungs every 4 (four) hours as needed. 10/10/17  Yes [provider]  spironolactone (ALDACTONE) 25 MG tablet Take 12.5 mg by mouth daily.   Yes [provider]  SYMBICORT 160-4.5 MCG/ACT inhaler Inhale 2 puffs into the lungs 2 (two) times daily. 10/10/17  Yes [provider]  torsemide (DEMADEX) 20 MG tablet Take 20 mg by mouth daily. Take 2 tablets if weight is greater than 185 lbs. NO tablets if weight is less than 177 lbs.   Yes [provider]  Multiple Vitamin (MULTIVITAMIN WITH MINERALS) TABS tablet Take 1 tablet by mouth daily. Patient not taking: Reported on 10/25/2018 06/28/18   Lang Snow, NP  thiamine 100 MG tablet Take 1 tablet (100 mg total) by mouth daily. Patient not taking: Reported on 10/25/2018 06/28/18   Lang Snow, NP   Current Facility-Administered Medications:    0.9 %  sodium chloride infusion (Manually program via Guardrails IV Fluids), , Intravenous, Once, Manuella Ghazi, Vipul, MD   0.9 %  sodium chloride infusion, , Intravenous, PRN, Lang Snow, NP, Last Rate: 130 mL/hr  at 10/26/18 1500   acetaminophen (TYLENOL) tablet 650 mg, 650 mg, Oral, Q6H PRN, Gouru, Aruna, MD, 650 mg at 10/27/18 0220   [START ON 10/28/2018] albumin human 25 % solution 12.5 g, 12.5 g, Intravenous, Daily, Singh, Harmeet, MD   albuterol (PROVENTIL) (2.5 MG/3ML) 0.083% nebulizer solution 2.5-5 mg, 2.5-5 mg, Inhalation, Q4H PRN, Stark Klein, Bing Neighbors, NP   atorvastatin (LIPITOR) tablet 40 mg, 40 mg, Oral, Daily, Ouma, Bing Neighbors, NP, 40 mg at 10/27/18 1717   cefTRIAXone (ROCEPHIN) 2 g in sodium chloride 0.9 % 100 mL IVPB, 2 g, Intravenous, Q24H, Gouru, Aruna, MD, Last Rate: 200 mL/hr at 10/27/18 1331, 2 g at 10/27/18 1331    Chlorhexidine Gluconate Cloth 2 % PADS 6 each, 6 each, Topical, Q0600, Gouru, Aruna, MD, 6 each at 10/27/18 0600   fluticasone (FLONASE) 50 MCG/ACT nasal spray 2 spray, 2 spray, Each Nare, Daily, Ouma, Bing Neighbors, NP, 2 spray at 04/54/09 8119   folic acid (FOLVITE) tablet 1 mg, 1 mg, Oral, Daily, Ouma, Bing Neighbors, NP, 1 mg at 10/27/18 0951   insulin aspart (novoLOG) injection 0-5 Units, 0-5 Units, Subcutaneous, QHS, Ouma, Bing Neighbors, NP, Stopped at 10/26/18 0456   insulin aspart (novoLOG) injection 0-9 Units, 0-9 Units, Subcutaneous, TID WC, Ouma, Bing Neighbors, NP, 1 Units at 10/26/18 1725   insulin glargine (LANTUS) injection 16 Units, 16 Units, Subcutaneous, QHS, Ouma, Bing Neighbors, NP, 16 Units at 10/26/18 2221   [START ON 10/28/2018] iron sucrose (VENOFER) 300 mg in sodium chloride 0.9 % 250 mL IVPB, 300 mg, Intravenous, Once, , Tally Due, MD   magnesium citrate solution 1 Bottle, 1 Bottle, Oral, Once, , Tally Due, MD   midodrine (PROAMATINE) tablet 5 mg, 5 mg, Oral, TID WC, Singh, Harmeet, MD, 5 mg at 10/27/18 1717   mometasone-formoterol (DULERA) 200-5 MCG/ACT inhaler 2 puff, 2 puff, Inhalation, BID, Ouma, Bing Neighbors, NP, 2 puff at 10/26/18 2005   mupirocin ointment (BACTROBAN) 2 % 1 application, 1 application, Nasal, BID, Gouru, Aruna, MD, 1 application at 14/78/29 1337   [START ON 10/29/2018] pantoprazole (PROTONIX) injection 40 mg, 40 mg, Intravenous, Q12H, Ouma, Bing Neighbors, NP   pregabalin (LYRICA) capsule 150 mg, 150 mg, Oral, BID, Ouma, Bing Neighbors, NP, 150 mg at 10/27/18 5621   Family History  Problem Relation Age of Onset   Hypertension Mother    Diabetes Mellitus II Mother    Breast cancer Mother    Heart disease Father    Cancer Sister      Social History   Tobacco Use   Smoking status: Current Some Day Smoker    Packs/day: 0.25    Types: Cigarettes   Smokeless tobacco: Never Used    Substance Use Topics   Alcohol use: No   Drug use: No    Allergies as of 10/25/2018 - Review Complete 10/25/2018  Allergen Reaction Noted   Latex Anaphylaxis and Rash 11/07/2012   Gabapentin Nausea And Vomiting and Other (See Comments) 12/20/2012    Review of Systems:    All systems reviewed and negative except where noted in HPI.   Physical Exam:  Vital signs in last 24 hours: Temp:  [98.1 F (36.7 C)-100 F (37.8 C)] 98.2 F (36.8 C) (10/10 1527) Pulse Rate:  [85-90] 87 (10/10 1527) Resp:  [18-20] 20 (10/10 1527) BP: (102-134)/(52-78) 132/72 (10/10 1527) SpO2:  [94 %-100 %] 99 % (10/10 1527) Last BM Date: 10/26/18 General:   Pleasant, cooperative in NAD Head:  Normocephalic and atraumatic. Eyes:  No icterus.   Conjunctiva pale. PERRLA. Ears:  Normal auditory acuity. Neck:  Supple; no masses or thyroidomegaly Lungs: Respirations even and unlabored. Lungs clear to auscultation bilaterally.   No wheezes, crackles, or rhonchi.  Heart:  Regular rate and rhythm;  Without murmur, clicks, rubs or gallops Abdomen:  Soft, nondistended, nontender. Normal bowel sounds. No appreciable masses or hepatomegaly.  No rebound or guarding.  Rectal:  Not performed. Msk:  Symmetrical without gross deformities.   Extremities:  Without edema, cyanosis or clubbing. Neurologic:  Alert and oriented x3;  grossly normal neurologically. Skin:  Intact without significant lesions or rashes. Psych:  Alert and cooperative. Normal affect.  LAB RESULTS: CBC Latest Ref Rng & Units 10/27/2018 10/26/2018 10/25/2018  WBC 4.0 - 10.5 K/uL 17.9(H) 15.1(H) 14.0(H)  Hemoglobin 12.0 - 15.0 g/dL 6.6(L) 5.4(L) 5.6(L)  Hematocrit 36.0 - 46.0 % 22.1(L) 20.0(L) 20.4(L)  Platelets 150 - 400 K/uL 160 156 182    BMET BMP Latest Ref Rng & Units 10/27/2018 10/26/2018 10/25/2018  Glucose 70 - 99 mg/dL 98 183(H) 191(H)  BUN 8 - 23 mg/dL 33(H) 26(H) 23  Creatinine 0.44 - 1.00 mg/dL 2.95(H) 2.27(H) 1.85(H)  Sodium 135  - 145 mmol/L 135 137 135  Potassium 3.5 - 5.1 mmol/L 4.8 4.7 4.3  Chloride 98 - 111 mmol/L 109 110 109  CO2 22 - 32 mmol/L 19(L) 16(L) 15(L)  Calcium 8.9 - 10.3 mg/dL 7.9(L) 7.8(L) 8.0(L)    LFT Hepatic Function Latest Ref Rng & Units 10/27/2018 08/29/2018 06/20/2018  Total Protein 6.5 - 8.1 g/dL 7.0 7.3 7.8  Albumin 3.5 - 5.0 g/dL 2.2(L) 2.6(L) 3.0(L)  AST 15 - 41 U/L 17 23 12(L)  ALT 0 - 44 U/L _0 Alk Phosphatase 38 - 126 U/L 74 83 94  Total Bilirubin 0.3 - 1.2 mg/dL 2.5(H) 0.6 0.4  Bilirubin, Direct 0.0 - 0.2 mg/dL 1.4(H) - -     STUDIES: Ct Abdomen Pelvis Wo Contrast  Result Date: 10/26/2018 CLINICAL DATA:  Abdominal pain, gastroenteritis or colitis EXAM: CT ABDOMEN AND PELVIS WITHOUT CONTRAST TECHNIQUE: Multidetector CT imaging of the abdomen and pelvis was performed following the standard protocol without IV contrast. COMPARISON:  June 21, 2018 FINDINGS: Lower chest: There is moderate cardiomegaly. Small bilateral pleural effusions are seen, right greater than left with peripheral atelectasis. Hepatobiliary: Although limited due to the lack of intravenous contrast, normal in appearance without gross focal abnormality. The patient is status post cholecystectomy. No biliary ductal dilation. Pancreas:  Unremarkable.  No surrounding inflammatory changes. Spleen: Normal in size. Although limited due to the lack of intravenous contrast, normal in appearance. Adrenals/Urinary Tract: Both adrenal glands appear normal. Again noted is a 6 cm low-density lesion lower pole of the left kidney brought to pull left-sided low-density lesions are seen. Bilateral perinephric stranding is seen. This appears to be more prominent than the prior exam, and is asymmetric, right greater than left. Stomach/Bowel: The stomach and small bowel are normal in size and contour. There is a moderate amount of colonic stool. Scattered colonic diverticula are noted without diverticulitis. Vascular/Lymphatic: There are no  enlarged abdominal or pelvic lymph nodes. Scattered aortic atherosclerotic calcifications are seen without aneurysmal dilatation. Reproductive: The uterus and adnexa are unremarkable. Other: There is diffuse extensive anasarca seen. There is a small fat containing anterior umbilical hernia. A small amount of perihepatic ascites is seen. Musculoskeletal: No acute or significant osseous findings. IMPRESSION: 1. Diffuse extensive anasarca. 2. Small bilateral pleural effusions, slightly increased in  size from the prior exam. 3. Small amount of perihepatic ascites. 4. Bilateral perinephric stranding, right greater than left, increased from the prior exam. This could be due to acute pyelonephritis. 5. Colonic diverticula without diverticulitis. Electronically Signed   By: Prudencio Pair M.D.   On: 10/26/2018 03:12   Dg Chest Portable 1 View  Result Date: 10/25/2018 CLINICAL DATA:  Fall EXAM: PORTABLE CHEST 1 VIEW COMPARISON:  08/29/2018 FINDINGS: Mild cardiomegaly with prosthetic valve and left atrial closure device. No pulmonary edema or focal airspace consolidation. No pleural effusion or pneumothorax. IMPRESSION: No active cardiopulmonary disease. Electronically Signed   By: Ulyses Jarred M.D.   On: 10/25/2018 21:14   US Abdomen Limited Ruq  Result Date: 10/27/2018 CLINICAL DATA:  Cirrhosis EXAM: ULTRASOUND ABDOMEN LIMITED RIGHT UPPER QUADRANT COMPARISON:  Abdominal ultrasound 12/19/2016, CT abdomen pelvis 10/26/2018 FINDINGS: Gallbladder: Surgically absent. Common bile duct: Diameter: 0.3 cm Liver: No focal lesion identified. The parenchymal echotexture is mildly heterogeneous with mildly nodular contour in keeping with history of cirrhosis. Portal vein is patent on color Doppler imaging with normal direction of blood flow towards the liver. Other: Probable small right pleural effusion. IMPRESSION: 1. Appearance of the liver compatible with cirrhosis. No focal liver lesion identified. 2.  Probable small right  pleural effusion. Electronically Signed   By: Audie Pinto M.D.   On: 10/27/2018 11:40      Impression / Plan:   [name] is a 63 y.o. female with metabolic syndrome, chronic iron deficiency anemia secondary to obscure GI bleed several admissions in the past due to severe symptomatic anemia readmitted with symptomatic anemia and found to have streptococcal bacteremia with no evidence of active GI bleed.  Patient has worsening leukocytosis.  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  Dr. Vicente Males will assume care tomorrow    LOS: 1 day   Sherri Sear, MD  10/27/2018, 5:28 PM   Note: This dictation was prepared with Dragon dictation along with smaller phrase technology. Any transcriptional errors that result from this process are unintentional.

## 2018-10-27 NOTE — Progress Notes (Signed)
PT Cancellation Note  Patient Details Name: [name] MRN: [id number] DOB: Mar 29, 1955   Cancelled Treatment:    Reason Eval/Treat Not Completed: Patient not medically ready;Medical issues which prohibited therapy. Chart reviewed. Hb is 6.6 which is contraindicated for PT at this time. Spoke with RN who reports that pt is scheduled for a transfusion today but she is unsure what time. Will continue to follow patient and attempt to see at later date/time once medically appropriate.   Lyndel Safe Britt Theard PT, DPT, GCS  Annalynne Ibanez 10/27/2018, 12:20 PM

## 2018-10-27 NOTE — TOC Progression Note (Signed)
Transition of Care [facility]) - Progression Note    Patient Details  Name: [name] MRN: [id number] Date of Birth: 04-Mar-1955  Transition of [facility] CM/SW Contact  Marshell Garfinkel, RN Phone Number: 10/27/2018, 3:29 PM  Clinical Narrative:     No current SNF bed offers for this patient under Medicaid.   Expected Discharge Plan: Skilled Nursing Facility Barriers to Discharge: SNF Pending bed offer, Continued Medical Work up  Expected Discharge Plan and Services Expected Discharge Plan: [place] arrangements for the past 2 months: Single Family Home                                       Social Determinants of Health (SDOH) Interventions    Readmission Risk Interventions Readmission Risk Prevention Plan 10/26/2018 08/21/2018 09/30/2017  Transportation Screening Complete Complete Complete  PCP or Specialist Appt within 5-7 Days - - Complete  Home Care Screening - - Complete  Medication Review (RN CM) - - Complete  HRI or Home Care Consult Complete Complete -  Social Work Consult for [place] Planning/Counseling Not Complete - -  SW consult not completed comments not recovery care - -  Palliative Care Screening Not Applicable Not Applicable -  Medication Review (RN Care Manager) Referral to Pharmacy Complete -  Some recent data might be hidden

## 2018-10-27 NOTE — Progress Notes (Signed)
Patient has pure wick in place and has produced approx 200 mls of tea colored urine.

## 2018-10-27 NOTE — Progress Notes (Signed)
[facility], [place] 10/27/18  Subjective:   Patient known to our practice from a previous admission in December 2018.  Then was lost to follow-up as outpatient. She comes in this time for inability to get up after a fall in the bathroom.  She had to call EMS to get some help At present she denies any acute shortness of breath.  She is currently on room air No nausea or vomiting.  Appetite is good She has generalized edema in her thighs and lower abdomen which has been building up over some time Reports arthritis in various joints Has difficulty getting up and walking now because of weight gain Denies any alcohol use or smoking   Objective:  Vital signs in last 24 hours:  Temp:  [97.8 F (36.6 C)-100 F (37.8 C)] 98.3 F (36.8 C) (10/10 0810) Pulse Rate:  [89-91] 90 (10/10 0810) Resp:  [18-28] 18 (10/09 2341) BP: (98-122)/(51-68) 102/52 (10/10 0810) SpO2:  [94 %-100 %] 100 % (10/10 0810)  Weight change:  Filed Weights   10/25/18 2022 10/25/18 2200 10/25/18 2215  Weight: (!) 254 kg 121.5 kg 125.1 kg    Intake/Output:    Intake/Output Summary (Last 24 hours) at 10/27/2018 0856 Last data filed at 10/26/2018 1631 Gross per 24 hour  Intake 1278.75 ml  Output -  Net 1278.75 ml     Physical Exam: General:  Obese female, laying in the bed, no acute distress  HEENT  moist oral mucous membranes  Neck  supple, no masses  Pulm/lungs  normal breathing effort on room air, clear to auscultation  CVS/Heart  regular rhythm, soft systolic murmur  Abdomen:   Soft, nontender  Extremities:  2-3+ pitting edema over thighs and lower abdomen  Neurologic:  Alert, oriented  Skin:  No acute rashes    Basic Metabolic Panel:  Recent Labs  Lab 10/25/18 2038 10/26/18 0500 10/27/18 0350  NA 135 137 135  K 4.3 4.7 4.8  CL 109 110 109  CO2 15* 16* 19*  GLUCOSE 191* 183* 98  BUN 23 26* 33*  CREATININE 1.85* 2.27* 2.95*  CALCIUM 8.0* 7.8* 7.9*      CBC: Recent Labs  Lab 10/25/18 2038 10/26/18 0500 10/27/18 0350  WBC 14.0* 15.1* 17.9*  NEUTROABS 12.7*  --  15.7*  HGB 5.6* 5.4* 6.6*  HCT 20.4* 20.0* 22.1*  MCV 78.5* 79.1* 77.8*  PLT 182 156 160     No results found for: HEPBSAG, HEPBSAB, HEPBIGM    Microbiology:  Recent Results (from the past 240 hour(s))  Blood culture (routine x 2)     Status: None (Preliminary result)   Collection Time: 10/25/18  8:38 PM   Specimen: BLOOD  Result Value Ref Range Status   Specimen Description   Final    BLOOD LEFT ANTECUBITAL Performed at [facility], [address] [place], [place] [address]    Special Requests   Final    BOTTLES DRAWN AEROBIC AND ANAEROBIC Blood Culture results may not be optimal due to an excessive volume of blood received in culture bottles Performed at [facility], [address] [place], [place] [address]    Culture  Setup Time   Final    GRAM POSITIVE COCCI IN BOTH AEROBIC AND ANAEROBIC BOTTLES CRITICAL RESULT CALLED TO, READ BACK BY AND VERIFIED WITH: CHARLES SHANLEVER @0835  ON 10/26/2018 BY FMW Performed at [facility], [address] [place],  [address]    Culture GRAM POSITIVE  COCCI  Final   Report Status PENDING  Incomplete  Blood culture (routine x 2)     Status: None (Preliminary result)   Collection Time: 10/25/18  8:38 PM   Specimen: BLOOD  Result Value Ref Range Status   Specimen Description BLOOD RIGHT ANTECUBITAL  Final   Special Requests   Final    BOTTLES DRAWN AEROBIC AND ANAEROBIC Blood Culture results may not be optimal due to an excessive volume of blood received in culture bottles   Culture  Setup Time   Final    GRAM POSITIVE COCCI IN BOTH AEROBIC AND ANAEROBIC BOTTLES CRITICAL VALUE NOTED.  VALUE IS CONSISTENT WITH PREVIOUSLY REPORTED AND CALLED VALUE. Performed at [facility], [address] [place], [place] [address]    Culture [facility] POSITIVE COCCI  Final   Report  Status PENDING  Incomplete  Blood Culture ID Panel (Reflexed)     Status: Abnormal   Collection Time: 10/25/18  8:38 PM  Result Value Ref Range Status   Enterococcus species NOT DETECTED NOT DETECTED Final   Listeria monocytogenes NOT DETECTED NOT DETECTED Final   Staphylococcus species NOT DETECTED NOT DETECTED Final   Staphylococcus aureus (BCID) NOT DETECTED NOT DETECTED Final   Streptococcus species DETECTED (A) NOT DETECTED Final    Comment: Not Enterococcus species, Streptococcus agalactiae, Streptococcus pyogenes, or Streptococcus pneumoniae. CRITICAL RESULT CALLED TO, READ BACK BY AND VERIFIED WITH: CHARLES SHANLEVER @0835  ON 10/26/2018 BY FMW    Streptococcus agalactiae NOT DETECTED NOT DETECTED Final   Streptococcus pneumoniae NOT DETECTED NOT DETECTED Final   Streptococcus pyogenes NOT DETECTED NOT DETECTED Final   Acinetobacter baumannii NOT DETECTED NOT DETECTED Final   Enterobacteriaceae species NOT DETECTED NOT DETECTED Final   Enterobacter cloacae complex NOT DETECTED NOT DETECTED Final   Escherichia coli NOT DETECTED NOT DETECTED Final   Klebsiella oxytoca NOT DETECTED NOT DETECTED Final   Klebsiella pneumoniae NOT DETECTED NOT DETECTED Final   Proteus species NOT DETECTED NOT DETECTED Final   Serratia marcescens NOT DETECTED NOT DETECTED Final   Haemophilus influenzae NOT DETECTED NOT DETECTED Final   Neisseria meningitidis NOT DETECTED NOT DETECTED Final   Pseudomonas aeruginosa NOT DETECTED NOT DETECTED Final   Candida albicans NOT DETECTED NOT DETECTED Final   Candida glabrata NOT DETECTED NOT DETECTED Final   Candida krusei NOT DETECTED NOT DETECTED Final   Candida parapsilosis NOT DETECTED NOT DETECTED Final   Candida tropicalis NOT DETECTED NOT DETECTED Final    Comment: Performed at [facility], [address] [place], [place] [address]  SARS Coronavirus 2 by RT PCR (hospital order, performed in [place] hospital lab) Nasopharyngeal  Nasopharyngeal Swab     Status: None   Collection Time: 10/25/18  8:49 PM   Specimen: Nasopharyngeal Swab  Result Value Ref Range Status   SARS Coronavirus 2 NEGATIVE NEGATIVE Final    Comment: (NOTE) If result is NEGATIVE SARS-CoV-2 target nucleic acids are NOT DETECTED. The SARS-CoV-2 RNA is generally detectable in upper and lower  respiratory specimens during the acute phase of infection. The lowest  concentration of SARS-CoV-2 viral copies this assay can detect is 250  copies / mL. A negative result does not preclude SARS-CoV-2 infection  and should not be used as the sole basis for treatment or other  patient management decisions.  A negative result may occur with  improper specimen collection / handling, submission of specimen other  than nasopharyngeal swab, presence of viral mutation(s) within the  areas targeted  by this assay, and inadequate number of viral copies  (<250 copies / mL). A negative result must be combined with clinical  observations, patient history, and epidemiological information. If result is POSITIVE SARS-CoV-2 target nucleic acids are DETECTED. The SARS-CoV-2 RNA is generally detectable in upper and lower  respiratory specimens dur ing the acute phase of infection.  Positive  results are indicative of active infection with SARS-CoV-2.  Clinical  correlation with patient history and other diagnostic information is  necessary to determine patient infection status.  Positive results do  not rule out bacterial infection or co-infection with other viruses. If result is PRESUMPTIVE POSTIVE SARS-CoV-2 nucleic acids MAY BE PRESENT.   A presumptive positive result was obtained on the submitted specimen  and confirmed on repeat testing.  While 2019 novel coronavirus  (SARS-CoV-2) nucleic acids may be present in the submitted sample  additional confirmatory testing may be necessary for epidemiological  and / or clinical management purposes  to differentiate between   SARS-CoV-2 and other Sarbecovirus currently known to infect humans.  If clinically indicated additional testing with an alternate test  methodology [phone]) is advised. The SARS-CoV-2 RNA is generally  detectable in upper and lower respiratory sp ecimens during the acute  phase of infection. The expected result is Negative. Fact Sheet for Patients:  StrictlyIdeas.no Fact Sheet for Healthcare Providers: BankingDealers.co.za This test is not yet approved or cleared by the [place] FDA and has been authorized for detection and/or diagnosis of SARS-CoV-2 by FDA under an Emergency Use Authorization (EUA).  This EUA will remain in effect (meaning this test can be used) for the duration of the COVID-19 declaration under Section 564(b)(1) of the Act, 21 U.S.C. section 360bbb-3(b)(1), unless the authorization is terminated or revoked sooner. Performed at [facility], [address] [place], [place] [address]   MRSA PCR Screening     Status: Abnormal   Collection Time: 10/26/18 11:09 AM   Specimen: Nasopharyngeal  Result Value Ref Range Status   MRSA by PCR POSITIVE (A) NEGATIVE Final    Comment:        The GeneXpert MRSA Assay (FDA approved for NASAL specimens only), is one component of a comprehensive MRSA colonization surveillance program. It is not intended to diagnose MRSA infection nor to guide or monitor treatment for MRSA infections. RESULT CALLED TO, READ BACK BY AND VERIFIED WITH: EMILY GANNON AT 1630 10/26/2018.PMF Performed at [facility], [address] [place], [place] [address]     Coagulation Studies: No results for input(s): LABPROT, INR in the last 72 hours.  Urinalysis: No results for input(s): COLORURINE, LABSPEC, PHURINE, GLUCOSEU, HGBUR, BILIRUBINUR, KETONESUR, PROTEINUR, UROBILINOGEN, NITRITE, LEUKOCYTESUR in the last 72 hours.  Invalid input(s): APPERANCEUR    Imaging: Ct  Abdomen Pelvis Wo Contrast  Result Date: 10/26/2018 CLINICAL DATA:  Abdominal pain, gastroenteritis or colitis EXAM: CT ABDOMEN AND PELVIS WITHOUT CONTRAST TECHNIQUE: Multidetector CT imaging of the abdomen and pelvis was performed following the standard protocol without IV contrast. COMPARISON:  June 21, 2018 FINDINGS: Lower chest: There is moderate cardiomegaly. Small bilateral pleural effusions are seen, right greater than left with peripheral atelectasis. Hepatobiliary: Although limited due to the lack of intravenous contrast, normal in appearance without gross focal abnormality. The patient is status post cholecystectomy. No biliary ductal dilation. Pancreas:  Unremarkable.  No surrounding inflammatory changes. Spleen: Normal in size. Although limited due to the lack of intravenous contrast, normal in appearance. Adrenals/Urinary Tract: Both adrenal glands appear normal. Again noted  is a 6 cm low-density lesion lower pole of the left kidney brought to pull left-sided low-density lesions are seen. Bilateral perinephric stranding is seen. This appears to be more prominent than the prior exam, and is asymmetric, right greater than left. Stomach/Bowel: The stomach and small bowel are normal in size and contour. There is a moderate amount of colonic stool. Scattered colonic diverticula are noted without diverticulitis. Vascular/Lymphatic: There are no enlarged abdominal or pelvic lymph nodes. Scattered aortic atherosclerotic calcifications are seen without aneurysmal dilatation. Reproductive: The uterus and adnexa are unremarkable. Other: There is diffuse extensive anasarca seen. There is a small fat containing anterior umbilical hernia. A small amount of perihepatic ascites is seen. Musculoskeletal: No acute or significant osseous findings. IMPRESSION: 1. Diffuse extensive anasarca. 2. Small bilateral pleural effusions, slightly increased in size from the prior exam. 3. Small amount of perihepatic ascites. 4.  Bilateral perinephric stranding, right greater than left, increased from the prior exam. This could be due to acute pyelonephritis. 5. Colonic diverticula without diverticulitis. Electronically Signed   By: Prudencio Pair M.D.   On: 10/26/2018 03:12   Dg Chest Portable 1 View  Result Date: 10/25/2018 CLINICAL DATA:  Fall EXAM: PORTABLE CHEST 1 VIEW COMPARISON:  08/29/2018 FINDINGS: Mild cardiomegaly with prosthetic valve and left atrial closure device. No pulmonary edema or focal airspace consolidation. No pleural effusion or pneumothorax. IMPRESSION: No active cardiopulmonary disease. Electronically Signed   By: Ulyses Jarred M.D.   On: 10/25/2018 21:14     Medications:   . sodium chloride 130 mL/hr at 10/26/18 1500  . cefTRIAXone (ROCEPHIN)  IV Stopped (10/26/18 1220)  . pantoprozole (PROTONIX) infusion 8 mg/hr (10/27/18 0315)   . sodium chloride   Intravenous Once  . atorvastatin  40 mg Oral Daily  . Chlorhexidine Gluconate Cloth  6 each Topical Q0600  . ferrous sulfate  325 mg Oral BID WC  . fluticasone  2 spray Each Nare Daily  . folic acid  1 mg Oral Daily  . insulin aspart  0-5 Units Subcutaneous QHS  . insulin aspart  0-9 Units Subcutaneous TID WC  . insulin glargine  16 Units Subcutaneous QHS  . mometasone-formoterol  2 puff Inhalation BID  . mupirocin ointment  1 application Nasal BID  . [START ON 10/29/2018] pantoprazole  40 mg Intravenous Q12H  . pregabalin  150 mg Oral BID   sodium chloride, acetaminophen, albuterol  Assessment/ Plan:  63 y.o. African American female with Medical problems of poorly controlled diabetes, hepatic cirrhosis, admission for gastric ulcer in 2018, congestive heart failure, grade 2 diastolic dysfunction, concentric LVH, Chronic low back pain, obstructive sleep apnea, history of CAD/STEMI, hepatitis C 2016  #Acute kidney injury Baseline creatinine 1.20/GFR 48 from August 29, 2018 Urinalysis from August 2 shows moderate blood, 0-5 RBCs, 0-5 WBCs.   Negative for protein.  UPEP negative for M spike in 2018 and Jan 2020. SPEP neg for M spike in 01/2018  Renal imaging in the form of CT abdomen pelvis without contrast shows bilateral perinephric stranding, no stone or obstruction. - obtain U/A and culture -Avoid hypotension -Add small dose midodrine to improve blood pressure  Cause for acute kidney injury is multifactorial.  Patient has gram-positive bacteremia therefore sepsis could be contributing. Home medications include diuretics Lasix, torsemide, spironolactone, also he was taking lisinopril. -Agree with holding above medications -Agree with treating sepsis  #Severe anemia -Previous history of GI bleed -GI team has been consulted -Management as per IM and GI teams  #  Anasarca 2D echo from August 2020 shows low normal LV EF of 50 to 55%.  Mild concentric LVH and diastolic dysfunction Patient is noted to have hypoalbuminemia -We will obtain HIV,, autoimmune work-up Has history of hepatitis C.  Will obtain right upper quadrant ultrasound to evaluate for cirrhosis - will supplement small dose albumin to assist with diuresis   #Chronic kidney disease stage III Baseline creatinine 1.2 from August 29, 2018/GFR 56 Serum creatinine has been fluctuating between 1.2-1.7 CKD is likely secondary to diabetes and atherosclerosis    LOS: [place] 10/10/20208:56 AM  [place], [place]  Note: This note was prepared with Dragon dictation. Any transcription errors are unintentional

## 2018-10-27 NOTE — Progress Notes (Addendum)
[facility] [place] NAME: [name]    MR#:  [id number]  DATE OF BIRTH:  1955-04-22  SUBJECTIVE:  Not voiding enough per nursing report, worsening kidney function, weak, hypotensive REVIEW OF SYSTEMS:  CONSTITUTIONAL: No fever, reports fatigue or weakness.  EYES: No blurred or double vision.  EARS, NOSE, AND THROAT: No tinnitus or ear pain.  RESPIRATORY: No cough, shortness of breath, wheezing or hemoptysis.  CARDIOVASCULAR: No chest pain, orthopnea, edema.  GASTROINTESTINAL: No nausea, vomiting, diarrhea or abdominal pain.  GENITOURINARY: No dysuria, hematuria.  ENDOCRINE: No polyuria, nocturia,  HEMATOLOGY: No anemia, easy bruising or bleeding SKIN: No rash or lesion. MUSCULOSKELETAL: No joint pain or arthritis.   NEUROLOGIC: No tingling, numbness, weakness.  PSYCHIATRY: No anxiety or depression.   DRUG ALLERGIES:   Allergies  Allergen Reactions  . Latex Anaphylaxis and Rash  . Gabapentin Nausea And Vomiting and Other (See Comments)    VITALS:  Blood pressure (!) 102/52, pulse 90, temperature 98.3 F (36.8 C), temperature source Oral, resp. rate 18, height 5' 8"  (1.727 m), weight 125.1 kg, SpO2 100 %.  PHYSICAL EXAMINATION:  GENERAL:  63 y.o.-year-old patient lying in the bed with no acute distress.  EYES: Pupils equal, round, reactive to light and accommodation. No scleral icterus. Extraocular muscles intact.  HEENT: Head atraumatic, normocephalic. Oropharynx and nasopharynx clear.  NECK:  Supple, no jugular venous distention. No thyroid enlargement, no tenderness.  LUNGS: Normal breath sounds bilaterally, no wheezing, rales,rhonchi or crepitation. No use of accessory muscles of respiration.  CARDIOVASCULAR: S1, S2 normal. No murmurs, rubs, or gallops.  ABDOMEN: Soft, nontender, nondistended. Bowel sounds present.  EXTREMITIES: No pedal edema, cyanosis, or clubbing.  NEUROLOGIC: Patient is arousable oriented x2-3  sensation intact. Gait not checked.  PSYCHIATRIC: The patient is alert and oriented x2- 3.  SKIN: No obvious rash, lesion, or ulcer.  LABORATORY PANEL:   CBC Recent Labs  Lab 10/27/18 0350  WBC 17.9*  HGB 6.6*  HCT 22.1*  PLT 160   ------------------------------------------------------------------------------------------------------------------  Chemistries  Recent Labs  Lab 10/27/18 0350  NA 135  K 4.8  CL 109  CO2 19*  GLUCOSE 98  BUN 33*  CREATININE 2.95*  CALCIUM 7.9*  AST 17  ALT 9  ALKPHOS 74  BILITOT 2.5*   ------------------------------------------------------------------------------------------------------------------  Cardiac Enzymes No results for input(s): TROPONINI in the last 168 hours. ------------------------------------------------------------------------------------------------------------------  RADIOLOGY:  Ct Abdomen Pelvis Wo Contrast  Result Date: 10/26/2018 CLINICAL DATA:  Abdominal pain, gastroenteritis or colitis EXAM: CT ABDOMEN AND PELVIS WITHOUT CONTRAST TECHNIQUE: Multidetector CT imaging of the abdomen and pelvis was performed following the standard protocol without IV contrast. COMPARISON:  June 21, 2018 FINDINGS: Lower chest: There is moderate cardiomegaly. Small bilateral pleural effusions are seen, right greater than left with peripheral atelectasis. Hepatobiliary: Although limited due to the lack of intravenous contrast, normal in appearance without gross focal abnormality. The patient is status post cholecystectomy. No biliary ductal dilation. Pancreas:  Unremarkable.  No surrounding inflammatory changes. Spleen: Normal in size. Although limited due to the lack of intravenous contrast, normal in appearance. Adrenals/Urinary Tract: Both adrenal glands appear normal. Again noted is a 6 cm low-density lesion lower pole of the left kidney brought to pull left-sided low-density lesions are seen. Bilateral perinephric stranding is seen. This  appears to be more prominent than the prior exam, and is asymmetric, right greater than left. Stomach/Bowel: The stomach and small bowel are normal in size and contour.  There is a moderate amount of colonic stool. Scattered colonic diverticula are noted without diverticulitis. Vascular/Lymphatic: There are no enlarged abdominal or pelvic lymph nodes. Scattered aortic atherosclerotic calcifications are seen without aneurysmal dilatation. Reproductive: The uterus and adnexa are unremarkable. Other: There is diffuse extensive anasarca seen. There is a small fat containing anterior umbilical hernia. A small amount of perihepatic ascites is seen. Musculoskeletal: No acute or significant osseous findings. IMPRESSION: 1. Diffuse extensive anasarca. 2. Small bilateral pleural effusions, slightly increased in size from the prior exam. 3. Small amount of perihepatic ascites. 4. Bilateral perinephric stranding, right greater than left, increased from the prior exam. This could be due to acute pyelonephritis. 5. Colonic diverticula without diverticulitis. Electronically Signed   By: Prudencio Pair M.D.   On: 10/26/2018 03:12   Dg Chest Portable 1 View  Result Date: 10/25/2018 CLINICAL DATA:  Fall EXAM: PORTABLE CHEST 1 VIEW COMPARISON:  08/29/2018 FINDINGS: Mild cardiomegaly with prosthetic valve and left atrial closure device. No pulmonary edema or focal airspace consolidation. No pleural effusion or pneumothorax. IMPRESSION: No active cardiopulmonary disease. Electronically Signed   By: Ulyses Jarred M.D.   On: 10/25/2018 21:14    EKG:   Orders placed or performed during the hospital encounter of 10/25/18  . EKG 12-Lead  . EKG 12-Lead  . EKG 12-Lead  . EKG 12-Lead    ASSESSMENT AND PLAN:  63 y.o. 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 Delana Meyer, and CKD to the ED with worsening shortness of breath and fall.  1. Sepsis -present on admission - chest x-ray shows no active cardiopulmonary disease - Covid negative -Lactic acid at 6.1->3.9 - repeat lactic acid and monitor procalcitonin - CT abdomen and pelvis concerning for acute pyelonephritis -right more than the left perinephric stranding - UA and culture pending - Blood cultures strep group C in 4 out of 4 bottles -Continue IV Rocephin for now. consult infectious disease  2.  Acute on chronic iron deficiency anemia -secondary to chronic blood loss from gastric and small bowel AVMs -s/p EGD (06/22/18) with oozing gastric ulcers treated with bipolar cautery, colonoscopy, capsule endoscopy as well as small bowel enteroscopy (06/2018) - Current hemoglobin 5.6 status post 1 unit of blood transfusion - hemoglobin 6.6, second unit blood transfusion  - Pantoprazole 75m IV x1 then gtt 865mhr - GI Consult , Dr. VaMarius Ditchs aware of the consult - Hold NSAIDs, steroids, ASA  3. Elevated troponin -likely demand ischemia - Continue to trend troponin  4. DM: sugars have been well-controlled on current regimen  - hemoglobin A1c 1 6/5 was 5.9 - sliding scale insulin coverage in addition to Lantus - ADA 2100 calorie diet    5. Chronic congestive heart failure with reduced EF - stable Echocardiogram with moderate LVH and EF at 40 to45% and diffuse hypokinesis noted. Patient has had bioprosthesis for aortic valve - Hold Lasix, Demadex, Coreg in the setting of hypotension - Low salt diet   6. COPD -noevidence of exacerbation.  - Supplemental O2, goal sat 88-92% - Duonebs q6hours - Continue home inhalers  7. Acute on chronic Kidney Disease -BUN/Cr slightly elevated above baseline - Hold nephrotoxins, consult nephrology - Continue to monitor renal function  8.  Anasarca -She had been on diuretics at  home but had to be hold due  to worsening kidney function here -Getting right upper quadrant abdominal ultrasound with a history of hepatitis C -Nephrology started Albumin which should help along with blood transfusion for voiding issue  9.weakness-PT eval    All the records are reviewed and case discussed with Care Management/Social Workerr. Management plans discussed with the patient, nursing and they are in agreement.  CODE STATUS: Full code  TOTAL TIME TAKING CARE OF THIS PATIENT: 35  minutes.   POSSIBLE D/C IN 2-3 DAYS, DEPENDING ON CLINICAL CONDITION.  Note: This dictation was prepared with Dragon dictation along with smaller phrase technology. Any transcriptional errors that result from this process are unintentional.   Max Sane M.D on 10/27/2018 at 11:33 AM  Between 7am to 6pm - Pager - [phone] After 6pm go to [email or web address] - password [email or web address] [facility]  Office  [phone]  CC: Primary care physician; [place]

## 2018-10-27 NOTE — Progress Notes (Signed)
PT Cancellation Note  Patient Details Name: [name] MRN: [id number] DOB: [birth year]   Cancelled Treatment:    Reason Eval/Treat Not Completed: Medical issues which prohibited therapy. Attempted to see patient at 1400 however she has still not started her transfusion. Pt contraindicated for therapy with Hb of 6.6. Will attempt on later date/time after transfusion with Hb over 7.0.   [name] PT, DPT, GCS  Britiney Blahnik 10/27/2018, 4:58 PM

## 2018-10-27 NOTE — Progress Notes (Signed)
Nursing tech had this patient yesterday and just commented that this patient is not voiding adequately. I do not see any voids documented for her yesterday and none so far this shift.  Bladder scanned for 87 mls only.   This information was not obtained in shift to shift report.  MD notified.

## 2018-10-27 NOTE — Progress Notes (Signed)
MD made aware that lab unable to draw labs from pt. 3 techs have attempted without success.

## 2018-10-28 ENCOUNTER — Encounter
Admission: EM | Disposition: A | Discharge status: Skilled Nursing Facility | Payer: Self-pay | Source: Home / Self Care | Attending: Internal Medicine

## 2018-10-28 ENCOUNTER — Inpatient Hospital Stay (HOSPITAL_COMMUNITY)
Admit: 2018-10-28 | Discharge: 2018-10-28 | Disposition: A | Discharge status: Home / Self Care | Payer: Medicaid Other | Attending: Infectious Diseases | Admitting: Infectious Diseases

## 2018-10-28 DIAGNOSIS — E119 Type 2 diabetes mellitus without complications: Secondary | ICD-10-CM

## 2018-10-28 DIAGNOSIS — D509 Iron deficiency anemia, unspecified: Secondary | ICD-10-CM

## 2018-10-28 DIAGNOSIS — E538 Deficiency of other specified B group vitamins: Secondary | ICD-10-CM

## 2018-10-28 DIAGNOSIS — N289 Disorder of kidney and ureter, unspecified: Secondary | ICD-10-CM

## 2018-10-28 DIAGNOSIS — A419 Sepsis, unspecified organism: Secondary | ICD-10-CM

## 2018-10-28 DIAGNOSIS — I361 Nonrheumatic tricuspid (valve) insufficiency: Secondary | ICD-10-CM

## 2018-10-28 DIAGNOSIS — J449 Chronic obstructive pulmonary disease, unspecified: Secondary | ICD-10-CM

## 2018-10-28 DIAGNOSIS — R601 Generalized edema: Secondary | ICD-10-CM

## 2018-10-28 DIAGNOSIS — I340 Nonrheumatic mitral (valve) insufficiency: Secondary | ICD-10-CM

## 2018-10-28 DIAGNOSIS — I252 Old myocardial infarction: Secondary | ICD-10-CM

## 2018-10-28 DIAGNOSIS — R531 Weakness: Secondary | ICD-10-CM

## 2018-10-28 DIAGNOSIS — E509 Vitamin A deficiency, unspecified: Secondary | ICD-10-CM

## 2018-10-28 HISTORY — PX: GIVENS CAPSULE STUDY: SHX5432

## 2018-10-28 LAB — BPAM RBC
Blood Product Expiration Date: 202010092359
Blood Product Expiration Date: 202010112359
Blood Product Expiration Date: 202011072359
Blood Product Expiration Date: 202011082359
ISSUE DATE / TIME: 202010090653
ISSUE DATE / TIME: 202010091316
ISSUE DATE / TIME: 202010100046
ISSUE DATE / TIME: 202010101507
Unit Type and Rh: 5100
Unit Type and Rh: 5100
Unit Type and Rh: 5100
Unit Type and Rh: 9500

## 2018-10-28 LAB — TYPE AND SCREEN
ABO/RH(D): O POS
Antibody Screen: NEGATIVE
Unit division: 0
Unit division: 0
Unit division: 0
Unit division: 0

## 2018-10-28 LAB — ECHOCARDIOGRAM COMPLETE
Height: 68 in
Weight: 4412.73 oz

## 2018-10-28 LAB — BASIC METABOLIC PANEL
Anion gap: 7 (ref 5–15)
BUN: 39 mg/dL — ABNORMAL HIGH (ref 8–23)
CO2: 19 mmol/L — ABNORMAL LOW (ref 22–32)
Calcium: 7.9 mg/dL — ABNORMAL LOW (ref 8.9–10.3)
Chloride: 108 mmol/L (ref 98–111)
Creatinine, Ser: 3.41 mg/dL — ABNORMAL HIGH (ref 0.44–1.00)
GFR calc Af Amer: 16 mL/min — ABNORMAL LOW (ref >60)
GFR calc non Af Amer: 14 mL/min — ABNORMAL LOW (ref >60)
Glucose, Bld: 105 mg/dL — ABNORMAL HIGH (ref 70–99)
Potassium: 5 mmol/L (ref 3.5–5.1)
Sodium: 134 mmol/L — ABNORMAL LOW (ref 135–145)

## 2018-10-28 LAB — CBC
HCT: 27.2 % — ABNORMAL LOW (ref 36.0–46.0)
Hemoglobin: 8.1 g/dL — ABNORMAL LOW (ref 12.0–15.0)
MCH: 24.3 pg — ABNORMAL LOW (ref 26.0–34.0)
MCHC: 29.8 g/dL — ABNORMAL LOW (ref 30.0–36.0)
MCV: 81.4 fL (ref 80.0–100.0)
Platelets: 193 10*3/uL (ref 150–400)
RBC: 3.34 MIL/uL — ABNORMAL LOW (ref 3.87–5.11)
RDW: 20.5 % — ABNORMAL HIGH (ref 11.5–15.5)
WBC: 17.8 10*3/uL — ABNORMAL HIGH (ref 4.0–10.5)
nRBC: 0.2 % (ref 0.0–0.2)

## 2018-10-28 LAB — HEPATITIS PANEL, ACUTE
HCV Ab: REACTIVE — AB
Hep A IgM: NONREACTIVE
Hep B C IgM: NONREACTIVE
Hepatitis B Surface Ag: NONREACTIVE

## 2018-10-28 LAB — RETIC PANEL
Immature Retic Fract: 34.3 % — ABNORMAL HIGH (ref 2.3–15.9)
RBC.: 3.32 MIL/uL — ABNORMAL LOW (ref 3.87–5.11)
Retic Count, Absolute: 81.7 10*3/uL (ref 19.0–186.0)
Retic Ct Pct: 2.5 % (ref 0.4–3.1)
Reticulocyte Hemoglobin: 18.8 pg — ABNORMAL LOW (ref >27.9)

## 2018-10-28 LAB — URINALYSIS, COMPLETE (UACMP) WITH MICROSCOPIC
Bilirubin Urine: NEGATIVE
Glucose, UA: NEGATIVE mg/dL
Ketones, ur: NEGATIVE mg/dL
Leukocytes,Ua: NEGATIVE
Nitrite: NEGATIVE
Protein, ur: NEGATIVE mg/dL
Specific Gravity, Urine: 1.005 (ref 1.005–1.030)
pH: 5 (ref 5.0–8.0)

## 2018-10-28 LAB — PROTEIN / CREATININE RATIO, URINE
Creatinine, Urine: 21 mg/dL
Protein Creatinine Ratio: 0.52 mg/mg{Cre} — ABNORMAL HIGH (ref 0.00–0.15)
Total Protein, Urine: 11 mg/dL

## 2018-10-28 LAB — CULTURE, BLOOD (ROUTINE X 2)

## 2018-10-28 LAB — HIV ANTIBODY (ROUTINE TESTING W REFLEX): HIV Screen 4th Generation wRfx: NONREACTIVE

## 2018-10-28 LAB — LACTATE DEHYDROGENASE: LDH: 290 U/L — ABNORMAL HIGH (ref 98–192)

## 2018-10-28 LAB — GLUCOSE, CAPILLARY
Glucose-Capillary: 72 mg/dL (ref 70–99)
Glucose-Capillary: 64 mg/dL — ABNORMAL LOW (ref 70–99)
Glucose-Capillary: 83 mg/dL (ref 70–99)
Glucose-Capillary: 68 mg/dL — ABNORMAL LOW (ref 70–99)
Glucose-Capillary: 74 mg/dL (ref 70–99)
Glucose-Capillary: 98 mg/dL (ref 70–99)

## 2018-10-28 LAB — LACTIC ACID, PLASMA: Lactic Acid, Venous: 1.6 mmol/L (ref 0.5–1.9)

## 2018-10-28 SURGERY — IMAGING PROCEDURE, GI TRACT, INTRALUMINAL, VIA CAPSULE

## 2018-10-28 MED ORDER — SODIUM CHLORIDE 0.9 % IV SOLN
INTRAVENOUS | Status: DC
  Administered 2018-10-28: 22:00:00 via INTRAVENOUS

## 2018-10-28 MED ORDER — FUROSEMIDE 10 MG/ML IJ SOLN
40.0000 mg | Freq: Once | INTRAMUSCULAR | Status: AC
  Administered 2018-10-28: 40 mg via INTRAVENOUS
  Filled 2018-10-28: qty 4

## 2018-10-28 NOTE — Plan of Care (Signed)
  Problem: Education: Goal: Knowledge of General Education information will improve Description: Including pain rating scale, medication(s)/side effects and non-pharmacologic comfort measures Outcome: Not Progressing   Problem: Health Behavior/Discharge Planning: Goal: Ability to manage health-related needs will improve Outcome: Not Progressing   Problem: Clinical Measurements: Goal: Ability to maintain clinical measurements within normal limits will improve Outcome: Not Progressing Goal: Will remain free from infection Outcome: Not Progressing Goal: Diagnostic test results will improve Outcome: Not Progressing Goal: Respiratory complications will improve Outcome: Not Progressing Goal: Cardiovascular complication will be avoided Outcome: Not Progressing   Problem: Activity: Goal: Risk for activity intolerance will decrease Outcome: Not Progressing   Problem: Nutrition: Goal: Adequate nutrition will be maintained Outcome: Not Progressing   Problem: Coping: Goal: Level of anxiety will decrease Outcome: Not Applicable

## 2018-10-28 NOTE — Consult Note (Signed)
[facility]  Date of admission:  10/26/2018  Inpatient day:  10/28/2018  Consulting physician: Dr Max Sane  Reason for Consultation:  Hemolysis?  Chief Complaint: [name] is a 63 y.o. female with a history of CHF, COPD, GI bleed, peptic ulcer disease, recurrent iron deficiency, NSTEMI, diabetes, left great toe osteomyelitis s/p amputation, and chronic kidney disease who was admitted through the emergency room s/p a fall at home with worsening shortness of breath and acute on chronic anemia.  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  She was last seen on 12/07/2017.  At that time she was extremely fatigued.  She had lost 100 pounds since open heart surgery in [place] in 01/2017.  Hemoglobin was 7.2.  Ferritin was 16.  She was scheduled for weekly Venofer x4.  She was also to receive B12 injections for documented B12 deficiency.  She was to be seen in follow-up in 1 month.  She was lost to follow-up.  EGD on 09/28/2017 was normal.  Colonoscopy on 09/28/2017 showed internal hemorrhoids and a 5 mm polyp in the sigmoid colon.  Capsule study on 11/04/2017 revealed 2 nonbleeding AVMs.   Small bowel endoscopy on 11/06/2017 revealed a single non-bleeding angioectasia in the jejunum and 3 bleeding angioectasias in the greater curvature of the gastric body treated with argon plasma anticoagulation.  Small bowel enteroscopy on 06/22/2018 revealed a normal duodenum and jejunum.  There was oozing gastric ulcers with hemorrhage treated by bipolar cautery.  Small bowel enteroscopy on 08/18/2018 revealed a normal esophagus and jejunum.  There was an erythematous mucosa in the greater curvature of the stomach which was biopsied (reactive gastritis).  Colonoscopy on 08/21/2018 a normal colon.  She has received 5 units of PRBCs in 2020.  She received 1 unit on 08/17/2018, 08/18/2018, 2 units on  10/26/2018 and 1 unit on 10/27/2018.  Ferritin has been followed: 13 on 09/29/2017, 16 on 12/07/2017, 34 on 01/15/2018, 9 on 06/21/2018 and 17 on 08/18/2018.  She presented to the ER via EMS on 10/25/2018.  She fell after sitting on the toilet at home.  She denies any loss of consciousness.  She denied any shortness of breath or chest pain.  Initial saturations were 88% by EMS.  She was initially febrile in the emergency room (100.0) with a blood pressure of 84/55.  Hematocrit was 20.4, hemoglobin 5.6, MCV 78.5, platelets 182,000 and white blood cell count 14,000 (ANC 12,700).  Creatinine was 1.85.  Lactic acid was 3.9.    She was found to have a group C streptococcus bacteremia.  She has been treated with ceftriaxone.  Abdomen and pelvic CT scan on 10/26/2018 revealed diffuse extensive anasarca.  There was small bilateral pleural effusions and a small amount of perihepatic ascites.  There was bilateral perinephric stranding (right > left) could indicate acute pyelonephritis.  There was chronic diverticula without diverticulitis.  CXR revealed no acute cardiopulmonary disease.  CBC today includes a hematocrit of 27.2, hemoglobin 8.1, MCV 81.4, platelets 193,000, white count 17,800.  LDH was 290 (98-192).  Reticulocyte was 2.5%.  Urinalysis revealed a small amount of hemoglobin with 0-5 RBC/HPF.   Haptoglobin is pending.  Hepatitis C antibody is positive.  Albumin was 2.2 with a total protein of 7.  Total bilirubin was 2.5 with a direct bilirubin of 1.4.  Creatinine has increased to 3.41 today.  Peripheral smear for path review on 10/25/2018 revealed leukocytosis  with absolute neutrophilia and slight left shift.  There was microchromic hyperacidic hypochromic anemia consistent with iron deficiency.  Platelet morphology was unremarkable.  Peripheral smear today reveals a mixed population of normocytic red blood cells as well as hypochromic microcytic red red blood cells consistent with  transfusion.  Patient has been seen by Dr. Marius Ditch of gastroenterology.  Commendations were for a capsule endoscopy after adequate resuscitation.  Symptomatically, she has noticed periodic blood in her stool.  She denies any melna.  She denies any vaginal bleeding or hematuria.  She is eating well.  She eats iron rich foods.  She denies any change in urine output or color.  She denies any Coca-[employer]a colored urine.  She denies any new medications or herbal products.   Past Medical History:  Diagnosis Date  . Cervical cancer ([place])   . CHF (congestive heart failure) ([place])   . Chronic anemia   . COPD (chronic obstructive pulmonary disease) ([place])   . Diabetes mellitus without complication ([place])   . Gastric ulcer   . NSTEMI (non-ST elevated myocardial infarction) ([place])   . Upper GI bleed     Past Surgical History:  Procedure Laterality Date  . AMPUTATION Left 06/24/2018   Procedure: AMPUTATION GREAT TOE;  Surgeon: Samara Deist, DPM;  Location: [facility] ORS;  Service: Podiatry;  Laterality: Left;  . APPENDECTOMY    . CARDIAC CATHETERIZATION    . CHOLECYSTECTOMY    . COLONOSCOPY N/A 09/28/2017   Procedure: COLONOSCOPY;  Surgeon: Toledo, Benay Pike, MD;  Location: [facility] ENDOSCOPY;  Service: Gastroenterology;  Laterality: N/A;  . COLONOSCOPY WITH PROPOFOL N/A 08/21/2018   Procedure: COLONOSCOPY WITH PROPOFOL;  Surgeon: Lin Landsman, MD;  Location: [facility] ENDOSCOPY;  Service: Gastroenterology;  Laterality: N/A;  . ENTEROSCOPY Left 11/06/2017   Procedure: ENTEROSCOPY;  Surgeon: Jonathon Bellows, MD;  Location: [facility] ENDOSCOPY;  Service: Gastroenterology;  Laterality: Left;  . ENTEROSCOPY N/A 06/22/2018   Procedure: ENTEROSCOPY;  Surgeon: Lin Landsman, MD;  Location: [facility] ENDOSCOPY;  Service: Gastroenterology;  Laterality: N/A;  . ENTEROSCOPY N/A 08/18/2018   Procedure: ENTEROSCOPY;  Surgeon: Jonathon Bellows, MD;  Location: [facility] ENDOSCOPY;  Service: Gastroenterology;  Laterality: N/A;  .  ESOPHAGOGASTRODUODENOSCOPY N/A 12/19/2016   Procedure: ESOPHAGOGASTRODUODENOSCOPY (EGD);  Surgeon: Lin Landsman, MD;  Location: [facility] ENDOSCOPY;  Service: Gastroenterology;  Laterality: N/A;  . ESOPHAGOGASTRODUODENOSCOPY N/A 09/28/2017   Procedure: ESOPHAGOGASTRODUODENOSCOPY (EGD);  Surgeon: Toledo, Benay Pike, MD;  Location: [facility] ENDOSCOPY;  Service: Gastroenterology;  Laterality: N/A;  . ESOPHAGOGASTRODUODENOSCOPY (EGD) WITH PROPOFOL N/A 08/08/2017   Procedure: ESOPHAGOGASTRODUODENOSCOPY (EGD) WITH PROPOFOL;  Surgeon: [name], MD;  Location: [facility] ENDOSCOPY;  Service: Endoscopy;  Laterality: N/A;  . [place]    . LEFT HEART CATH AND CORONARY ANGIOGRAPHY N/A 12/23/2016   Procedure: LEFT HEART CATH AND CORONARY ANGIOGRAPHY;  Surgeon: Teodoro Spray, MD;  Location: [place] CV LAB;  Service: Cardiovascular;  Laterality: N/A;  . LOWER EXTREMITY ANGIOGRAPHY Left 06/26/2018   Procedure: Lower Extremity Angiography;  Surgeon: Katha Cabal, MD;  Location: [place] CV LAB;  Service: Cardiovascular;  Laterality: Left;    Family History  Problem Relation Age of Onset  . Hypertension Mother   . Diabetes Mellitus II Mother   . Breast cancer Mother   . Heart disease Father   . Cancer Sister     Social History:  reports that she has been smoking cigarettes. She has been smoking about 0.25 packs per day. She has never used smokeless tobacco.  She reports that she does not drink alcohol or use drugs.  Allergies:  Allergies  Allergen Reactions  . Latex Anaphylaxis and Rash  . Gabapentin Nausea And Vomiting and Other (See Comments)    Medications Prior to Admission  Medication Sig Dispense Refill  . atorvastatin (LIPITOR) 40 MG tablet Take 40 mg by mouth daily.    . carvedilol (COREG) 3.125 MG tablet Take 1 tablet (3.125 mg total) by mouth 2 (two) times daily with a meal. 60 tablet 0  . ferrous sulfate 325 (65 FE) MG tablet Take 1 tablet (325 mg total) by  mouth 2 (two) times daily with a meal. 60 tablet 0  . fluticasone (FLONASE) 50 MCG/ACT nasal spray Place 2 sprays into both nostrils daily.    . folic acid (FOLVITE) 1 MG tablet Take 1 tablet (1 mg total) by mouth daily. 30 tablet 0  . furosemide (LASIX) 40 MG tablet Take 1 tablet (40 mg total) by mouth daily. (Patient taking differently: Take 40 mg by mouth daily as needed. Take if weight is increased (Per instructions)) 30 tablet 11  . Insulin Glargine (LANTUS SOLOSTAR) 100 UNIT/ML Solostar Pen Inject 15 Units into the skin daily. (Patient taking differently: Inject 16 Units into the skin at bedtime. ) 300 mL 0  . lisinopril (ZESTRIL) 5 MG tablet Take 5 mg by mouth daily.    [name] Kitchen omeprazole (PRILOSEC) 40 MG capsule Take 1 capsule (40 mg total) by mouth 2 (two) times a day. 120 capsule 1  . pantoprazole (PROTONIX) 40 MG tablet Take 40 mg by mouth 2 (two) times daily.    . pregabalin (LYRICA) 150 MG capsule Take 150 mg by mouth 2 (two) times daily.    [name] Kitchen PROAIR HFA 108 (90 Base) MCG/ACT inhaler Inhale 1-2 puffs into the lungs every 4 (four) hours as needed.  2  . spironolactone (ALDACTONE) 25 MG tablet Take 12.5 mg by mouth daily.    . SYMBICORT 160-4.5 MCG/ACT inhaler Inhale 2 puffs into the lungs 2 (two) times daily.  2  . torsemide (DEMADEX) 20 MG tablet Take 20 mg by mouth daily. Take 2 tablets if weight is greater than 185 lbs. NO tablets if weight is less than 177 lbs.    . Multiple Vitamin (MULTIVITAMIN WITH MINERALS) TABS tablet Take 1 tablet by mouth daily. (Patient not taking: Reported on 10/25/2018) 30 tablet 0  . thiamine 100 MG tablet Take 1 tablet (100 mg total) by mouth daily. (Patient not taking: Reported on 10/25/2018) 30 tablet 0    Review of Systems: GENERAL:  Fatigued, but "ok".  No fevers, sweats or weight loss. PERFORMANCE STATUS (ECOG):  1-2 HEENT:  No visual changes, runny nose, sore throat, mouth sores or tenderness. Lungs: No shortness of breath or cough.  No  hemoptysis. Cardiac:  CHF with an EF of 40-45%.  Bioprosthetic valve.  No chest pain, palpitations, orthopnea, or PND.   GI:  Intermittent blood in stool.  Eating well.  No nausea, vomiting, diarrhea, constipation, melena. GU:  No urgency, frequency, dysuria, or hematuria. Musculoskeletal:  S/p left great toe amputation.  No back pain.  No joint pain.  No muscle tenderness. Extremities:  No pain or swelling. Skin:  Left buttock ulcer.  Bilateral upper arm rash since admission, improving. Neuro:  No headache, numbness or weakness, balance or coordination issues. Endocrine:  Diabetes.  No thyroid issues, hot flashes or night sweats. Psych:  No mood changes, depression or anxiety. Pain:  No focal pain. Review of  systems:  All other systems reviewed and found to be negative.  Physical Exam:  Blood pressure 129/68, pulse 79, temperature 98 F (36.7 C), resp. rate 20, height _0  (1.727 m), weight 275 lb 12.7 oz (125.1 kg), SpO2 100 %.  GENERAL:  Well developed, well nourished, overweight woman sitting comfortably on the medical unit in no acute distress. MENTAL STATUS:  Alert and oriented to person, place and time. HEAD:  Graying hair.  Normocephalic, atraumatic, face symmetric, no Cushingoid features. EYES:  Brown eyes.  Pupils equal round and reactive to light and accomodation.  No conjunctivitis or scleral icterus. ENT:  Oropharynx clear without lesion.  Tongue normal. Mucous membranes moist.  RESPIRATORY:  Clear to auscultation anteriorly without rales, wheezes or rhonchi. CARDIOVASCULAR:  Regular rate and rhythm without murmur, rub or gallop. ABDOMEN:  Soft, non-tender, with active bowel sounds, and no appreciable hepatosplenomegaly.  No masses. SKIN:  Fading upper extremity rash. EXTREMITIES: s/p left great toe amputation.  No edema, no skin discoloration or tenderness.  No palpable cords. LYMPH NODES: No palpable cervical, supraclavicular, axillary or inguinal adenopathy  NEUROLOGICAL:  Unremarkable. PSYCH:  Appropriate.   Results for orders placed or performed during the hospital encounter of 10/25/18 (from the past 48 hour(s))  Glucose, capillary     Status: Abnormal   Collection Time: 10/26/18  4:38 PM  Result Value Ref Range   Glucose-Capillary 138 (H) 70 - 99 mg/dL  Glucose, capillary     Status: Abnormal   Collection Time: 10/26/18  9:17 PM  Result Value Ref Range   Glucose-Capillary 117 (H) 70 - 99 mg/dL  CBC with Differential/Platelet     Status: Abnormal   Collection Time: 10/27/18  3:50 AM  Result Value Ref Range   WBC 17.9 (H) 4.0 - 10.5 K/uL   RBC 2.84 (L) 3.87 - 5.11 MIL/uL   Hemoglobin 6.6 (L) 12.0 - 15.0 g/dL    Comment: Reticulocyte Hemoglobin testing may be clinically indicated, consider ordering this additional test [id number]    HCT 22.1 (L) 36.0 - 46.0 %   MCV 77.8 (L) 80.0 - 100.0 fL   MCH 23.2 (L) 26.0 - 34.0 pg   MCHC 29.9 (L) 30.0 - 36.0 g/dL   RDW 20.7 (H) 11.5 - 15.5 %   Platelets 160 150 - 400 K/uL   nRBC 0.2 0.0 - 0.2 %   Neutrophils Relative % 87 %   Neutro Abs 15.7 (H) 1.7 - 7.7 K/uL   Lymphocytes Relative 4 %   Lymphs Abs 0.6 (L) 0.7 - 4.0 K/uL   Monocytes Relative 7 %   Monocytes Absolute 1.3 (H) 0.1 - 1.0 K/uL   Eosinophils Relative 1 %   Eosinophils Absolute 0.1 0.0 - 0.5 K/uL   Basophils Relative 0 %   Basophils Absolute 0.1 0.0 - 0.1 K/uL   WBC Morphology      MODERATE LEFT SHIFT (>5% METAS AND MYELOS,OCC PRO NOTED)   Immature Granulocytes 1 %   Abs Immature Granulocytes 0.11 (H) 0.00 - 0.07 K/uL   Polychromasia PRESENT     Comment: Performed at [facility], [address] [place], [place] [address]  Comprehensive metabolic panel     Status: Abnormal   Collection Time: 10/27/18  3:50 AM  Result Value Ref Range   Sodium 135 135 - 145 mmol/L   Potassium 4.8 3.5 - 5.1 mmol/L   Chloride 109 98 - 111 mmol/L   CO2 19 (L) 22 - 32 mmol/L   Glucose,  Bld 98 70 - 99 mg/dL   BUN 33 (H) 8 - 23 mg/dL    Creatinine, Ser 2.95 (H) 0.44 - 1.00 mg/dL   Calcium 7.9 (L) 8.9 - 10.3 mg/dL   Total Protein 7.0 6.5 - 8.1 g/dL   Albumin 2.2 (L) 3.5 - 5.0 g/dL   AST 17 15 - 41 U/L   ALT 9 0 - 44 U/L   Alkaline Phosphatase 74 38 - 126 U/L   Total Bilirubin 2.5 (H) 0.3 - 1.2 mg/dL   GFR calc non Af Amer 16 (L) >60 mL/min   GFR calc Af Amer 19 (L) >60 mL/min   Anion gap 7 5 - 15    Comment: Performed at [facility], [address] [place], [place] [address]  Bilirubin, direct     Status: Abnormal   Collection Time: 10/27/18  3:50 AM  Result Value Ref Range   Bilirubin, Direct 1.4 (H) 0.0 - 0.2 mg/dL    Comment: Performed at [facility], [address] [place], [place] [address]  Hepatitis panel, acute     Status: Abnormal   Collection Time: 10/27/18  3:50 AM  Result Value Ref Range   Hepatitis B Surface Ag NON REACTIVE NON REACTIVE   HCV Ab Reactive (A) NON REACTIVE    Comment: REPEATED TO VERIFY   Hep A IgM NON REACTIVE NON REACTIVE   Hep B C IgM NON REACTIVE NON REACTIVE    Comment: Performed at [facility], [place] [address] [place], [place] [address]  Prepare RBC     Status: None   Collection Time: 10/27/18  8:00 AM  Result Value Ref Range   Order Confirmation      ORDER PROCESSED BY BLOOD BANK Performed at [facility], [address] [place], [place] [address]   Glucose, capillary     Status: Abnormal   Collection Time: 10/27/18  8:11 AM  Result Value Ref Range   Glucose-Capillary 106 (H) 70 - 99 mg/dL  Glucose, capillary     Status: Abnormal   Collection Time: 10/27/18 11:46 AM  Result Value Ref Range   Glucose-Capillary 120 (H) 70 - 99 mg/dL  Protein / creatinine ratio, urine     Status: Abnormal   Collection Time: 10/27/18 12:08 PM  Result Value Ref Range   Creatinine, Urine 21 mg/dL   Total Protein, Urine 11 mg/dL    Comment: NO NORMAL RANGE ESTABLISHED FOR THIS TEST   Protein Creatinine Ratio 0.52 (H) 0.00 - 0.15 mg/mg[Cre]     Comment: Performed at [facility], [address] [place], [place] [address]  Glucose, capillary     Status: Abnormal   Collection Time: 10/27/18  4:47 PM  Result Value Ref Range   Glucose-Capillary 116 (H) 70 - 99 mg/dL  Glucose, capillary     Status: Abnormal   Collection Time: 10/27/18  9:21 PM  Result Value Ref Range   Glucose-Capillary 129 (H) 70 - 99 mg/dL  Lactic acid, plasma     Status: None   Collection Time: 10/28/18 12:47 AM  Result Value Ref Range   Lactic Acid, Venous 1.6 0.5 - 1.9 mmol/L    Comment: Performed at [facility], [address] [place], [place] [address]  HIV Antibody (routine testing w rflx)     Status: None   Collection Time: 10/28/18 12:47 AM  Result Value Ref Range   HIV Screen 4th Generation wRfx NON REACTIVE NON REACTIVE  Comment: Performed at [facility], [place] [address], [place], [place] [address]  CBC     Status: Abnormal   Collection Time: 10/28/18 12:48 AM  Result Value Ref Range   WBC 17.8 (H) 4.0 - 10.5 K/uL   RBC 3.34 (L) 3.87 - 5.11 MIL/uL   Hemoglobin 8.1 (L) 12.0 - 15.0 g/dL   HCT 27.2 (L) 36.0 - 46.0 %   MCV 81.4 80.0 - 100.0 fL   MCH 24.3 (L) 26.0 - 34.0 pg   MCHC 29.8 (L) 30.0 - 36.0 g/dL   RDW 20.5 (H) 11.5 - 15.5 %   Platelets 193 150 - 400 K/uL   nRBC 0.2 0.0 - 0.2 %    Comment: Performed at [facility], [address] [place], [place] [address]  Basic metabolic panel     Status: Abnormal   Collection Time: 10/28/18 12:48 AM  Result Value Ref Range   Sodium 134 (L) 135 - 145 mmol/L   Potassium 5.0 3.5 - 5.1 mmol/L   Chloride 108 98 - 111 mmol/L   CO2 19 (L) 22 - 32 mmol/L   Glucose, Bld 105 (H) 70 - 99 mg/dL   BUN 39 (H) 8 - 23 mg/dL   Creatinine, Ser 3.41 (H) 0.44 - 1.00 mg/dL   Calcium 7.9 (L) 8.9 - 10.3 mg/dL   GFR calc non Af Amer 14 (L) >60 mL/min   GFR calc Af Amer 16 (L) >60 mL/min   Anion gap 7 5 - 15    Comment: Performed at [facility], [address]  [place], [place] [address]  CULTURE, BLOOD (ROUTINE X 2) w Reflex to ID Panel     Status: None (Preliminary result)   Collection Time: 10/28/18 12:48 AM   Specimen: BLOOD  Result Value Ref Range   Specimen Description BLOOD RIGHT ANTECUBITAL    Special Requests      BOTTLES DRAWN AEROBIC AND ANAEROBIC Blood Culture adequate volume   Culture      NO GROWTH < 12 HOURS Performed at [facility], [address], [place], [place] [address]    Report Status PENDING   CULTURE, BLOOD (ROUTINE X 2) w Reflex to ID Panel     Status: None (Preliminary result)   Collection Time: 10/28/18  5:06 AM   Specimen: BLOOD  Result Value Ref Range   Specimen Description BLOOD LEFT WRIST    Special Requests      BOTTLES DRAWN AEROBIC AND ANAEROBIC Blood Culture adequate volume   Culture      NO GROWTH <12 HOURS Performed at [facility], [address] [place],  [address]    Report Status PENDING   Glucose, capillary     Status: None   Collection Time: 10/28/18  7:30 AM  Result Value Ref Range   Glucose-Capillary 72 70 - 99 mg/dL  Glucose, capillary     Status: Abnormal   Collection Time: 10/28/18 11:47 AM  Result Value Ref Range   Glucose-Capillary 64 (L) 70 - 99 mg/dL  Urinalysis, Complete w Microscopic     Status: Abnormal   Collection Time: 10/28/18 12:08 PM  Result Value Ref Range   Color, Urine STRAW (A) YELLOW   APPearance CLEAR (A) CLEAR   Specific Gravity, Urine 1.005 1.005 - 1.030   pH 5.0 5.0 - 8.0   Glucose, UA NEGATIVE NEGATIVE mg/dL   Hgb urine dipstick SMALL (A) NEGATIVE   Bilirubin Urine NEGATIVE NEGATIVE   Ketones, ur  NEGATIVE NEGATIVE mg/dL   Protein, ur NEGATIVE NEGATIVE mg/dL   Nitrite NEGATIVE NEGATIVE   Leukocytes,Ua NEGATIVE NEGATIVE   RBC / HPF 0-5 0 - 5 RBC/hpf   WBC, UA 0-5 0 - 5 WBC/hpf   Bacteria, UA FEW (A) NONE SEEN   Squamous Epithelial / LPF 6-10 0 - 5   Mucus PRESENT    Budding Yeast PRESENT     Comment: Performed at [facility], [address] [place], [place] [address]  Glucose, capillary     Status: None   Collection Time: 10/28/18 12:41 PM  Result Value Ref Range   Glucose-Capillary 83 70 - 99 mg/dL  Lactate dehydrogenase     Status: Abnormal   Collection Time: 10/28/18 12:55 PM  Result Value Ref Range   LDH 290 (H) 98 - 192 U/L    Comment: HEMOLYSIS AT THIS LEVEL MAY AFFECT RESULT Performed at [facility], [address] [place], [place] [address]   Retic Panel     Status: Abnormal   Collection Time: 10/28/18 12:55 PM  Result Value Ref Range   Retic Ct Pct 2.5 0.4 - 3.1 %   RBC. 3.32 (L) 3.87 - 5.11 MIL/uL   Retic Count, Absolute 81.7 19.0 - 186.0 K/uL   Immature Retic Fract 34.3 (H) 2.3 - 15.9 %   Reticulocyte Hemoglobin 18.8 (L) >27.9 pg    Comment:        A RET-He < 28 pg is an indication of iron-deficient or iron- insufficient erythropoiesis. Patients with thalassemia may also have a decreased RET-He result unrelated to iron availability.     If this patient has chronic kidney disease and does not have a hemoglobinopathy he/she meets criteria for iron deficiency per the 2016 NICE guidelines. Refer to specific guidelines to determine the appropriate thresholds for treating CKD- associated iron deficiency. TSAT and ferritin should be used in patients with hemoglobinopathies (e.g. thalassemia). Performed at [facility], [address] [place], [place] [address]    US Abdomen Limited Ruq  Result Date: 10/27/2018 CLINICAL DATA:  Cirrhosis EXAM: ULTRASOUND ABDOMEN LIMITED RIGHT UPPER QUADRANT COMPARISON:  Abdominal ultrasound 12/19/2016, CT abdomen pelvis 10/26/2018 FINDINGS: Gallbladder: Surgically absent. Common bile duct: Diameter: 0.3 cm Liver: No focal lesion identified. The parenchymal echotexture is mildly heterogeneous with mildly nodular contour in keeping with history of cirrhosis. Portal vein is patent on color Doppler imaging with normal  direction of blood flow towards the liver. Other: Probable small right pleural effusion. IMPRESSION: 1. Appearance of the liver compatible with cirrhosis. No focal liver lesion identified. 2.  Probable small right pleural effusion. Electronically Signed   By: Audie Pinto M.D.   On: 10/27/2018 11:40    Assessment:  The patient is a 63 y.o. woman with chronic iron deficiency anemia secondary to small bowel and gastric AVMs.  She was admitted with symptomatic anemia.  She has group C streptococcus bacteremia and is on ceftriaxone.   Capsule study on 11/04/2017 revealed 2 nonbleeding AVMs.   Small bowel endoscopy on 11/06/2017 revealed a single non-bleeding angioectasia in the jejunum and 3 bleeding angioectasias in the greater curvature of the gastric body treated with argon plasma anticoagulation.  Small bowel enteroscopy on 06/22/2018 revealed a normal duodenum and jejunum.  There was oozing gastric ulcers with hemorrhage treated by bipolar cautery.  Small bowel enteroscopy on 08/18/2018 revealed a normal esophagus and jejunum.  There was an erythematous mucosa in the greater curvature of the stomach which was biopsied (  reactive gastritis).  Colonoscopy on 08/21/2018 a normal colon.  Ferritin has been followed: 13 on 09/29/2017, 16 on 12/07/2017, 34 on 01/15/2018, 9 on 06/21/2018 and 17 on 08/18/2018.  She has received 3 units of PRBCs during this admission.  Hemoglobin has improved from 5.6 to 8.1.    Peripheral smear on 10/25/2018 revealed leukocytosis with absolute neutrophilia and slight left shift.  There was microchromic hyperacidic hypochromic anemia consistent with iron deficiency.  Platelet morphology was unremarkable.  Peripheral smear today reveals a mixed population of normocytic red blood cells as well as hypochromic microcytic red red blood cells consistent with transfusion.  There was a rare fragmented cell.  Additional labs note hepatitis C antibody is positive.  Albumin was 2.2  with a total protein of 7 (normal).  Total bilirubin was 2.5 with a direct bilirubin of 1.4.  Creatinine has increased from 1.85 to 3.41 today.  Symptomatically, she notes intermittent blood in her stool.    Plan:   1.   Microcytic anemia  Patient has a long standing history of iron deficiency anemia.  Patient has had an appropriate response to transfusion.   She has received 3 units of PRBCs   Hemoglobin has improved from 5.6 to 8.1.  She notes intermittent blood in her stool.  She denies any vaginal bleeding or hematuria.  No spherocytes on peripheral smear.  Indirect bilirubin is 1.3.  LDH is 290 (98-192).  Reticulocyte count 2.5%.  Peripheral smear suggests 2 populations of cells (patient's hypochromic microcytic RBCs and transfused cells).  Follow-up haptoglobin.  Check Coombs (suspect normal) and myeloma panel (low albumen and normal total protein).  Anticipate capsule study.  Follow CBC daily.    Maintain active type and screen. 2.   Hepatitis C positive  Patient to follow-up with GI. 3.   Group C streptococcus bacteremia  Etiology unclear.  Appreciate ID consultation.  Possible GI source or GU source (perinephric stranding on CT). 4.   Renal insufficiency  Patient has progressive renal insufficiency  Baseline creatinine 1.2.  Creatinine has increased from 1.85 to 3.41.  Appreciate nephrology consult.  Etiology felt multi-factorial (bacteremia, diuretics, lisinopril). 5.   Anticipate follow-up in the hematology clinic for ongoing care and IV Venofer.  Thank you for allowing me to participate in [name] 's care.  I will follow her closely with you while hospitalized and after discharge in the outpatient department.   Lequita Asal, MD  10/28/2018, 2:18 PM

## 2018-10-28 NOTE — Progress Notes (Signed)
PT Cancellation Note  Patient Details Name: [name] MRN: [id number] DOB: 02-Dec-1955   Cancelled Treatment:    Reason Eval/Treat Not Completed: Patient at procedure or test/unavailable, Patient is getting a blood transfusion, unavailable for PT.    [address], [place], [place] DPT 10/28/2018, 1:02 PM

## 2018-10-28 NOTE — Progress Notes (Signed)
[facility] [place] NAME: [name]    MR#:  [id number]  DATE OF BIRTH:  [birth year]  SUBJECTIVE:  worsening kidney function, weak, Hb 8.1 REVIEW OF SYSTEMS:  CONSTITUTIONAL: No fever, reports fatigue or weakness.  EYES: No blurred or double vision.  EARS, NOSE, AND THROAT: No tinnitus or ear pain.  RESPIRATORY: No cough, shortness of breath, wheezing or hemoptysis.  CARDIOVASCULAR: No chest pain, orthopnea, edema.  GASTROINTESTINAL: No nausea, vomiting, diarrhea or abdominal pain.  GENITOURINARY: No dysuria, hematuria.  ENDOCRINE: No polyuria, nocturia,  HEMATOLOGY: No anemia, easy bruising or bleeding SKIN: No rash or lesion. MUSCULOSKELETAL: No joint pain or arthritis.   NEUROLOGIC: No tingling, numbness, weakness.  PSYCHIATRY: No anxiety or depression.   DRUG ALLERGIES:   Allergies  Allergen Reactions  . Latex Anaphylaxis and Rash  . Gabapentin Nausea And Vomiting and Other (See Comments)    VITALS:  Blood pressure 129/68, pulse 79, temperature 98 F (36.7 C), resp. rate 20, height 5' 8"  (1.727 m), weight 125.1 kg, SpO2 100 %. PHYSICAL EXAMINATION:  GENERAL:  63 y.o.-year-old patient lying in the bed with no acute distress.  EYES: Pupils equal, round, reactive to light and accommodation. No scleral icterus. Extraocular muscles intact.  HEENT: Head atraumatic, normocephalic. Oropharynx and nasopharynx clear.  NECK:  Supple, no jugular venous distention. No thyroid enlargement, no tenderness.  LUNGS: Normal breath sounds bilaterally, no wheezing, rales,rhonchi or crepitation. No use of accessory muscles of respiration.  CARDIOVASCULAR: S1, S2 normal. No murmurs, rubs, or gallops.  ABDOMEN: Soft, nontender, nondistended. Bowel sounds present.  EXTREMITIES: No pedal edema, cyanosis, or clubbing.  NEUROLOGIC: Patient is arousable oriented x2-3 sensation intact. Gait not checked.  PSYCHIATRIC: The patient is alert and  oriented x2- 3.  SKIN: No obvious rash, lesion, or ulcer.  LABORATORY PANEL:   CBC Recent Labs  Lab 10/28/18 0048  WBC 17.8*  HGB 8.1*  HCT 27.2*  PLT 193   ------------------------------------------------------------------------------------------------------------------  Chemistries  Recent Labs  Lab 10/27/18 0350 10/28/18 0048  NA 135 134*  K 4.8 5.0  CL 109 108  CO2 19* 19*  GLUCOSE 98 105*  BUN 33* 39*  CREATININE 2.95* 3.41*  CALCIUM 7.9* 7.9*  AST 17  --   ALT 9  --   ALKPHOS 74  --   BILITOT 2.5*  --    ------------------------------------------------------------------------------------------------------------------  Cardiac Enzymes No results for input(s): TROPONINI in the last 168 hours. ------------------------------------------------------------------------------------------------------------------  RADIOLOGY:  US Abdomen Limited Ruq  Result Date: 10/27/2018 CLINICAL DATA:  Cirrhosis EXAM: ULTRASOUND ABDOMEN LIMITED RIGHT UPPER QUADRANT COMPARISON:  Abdominal ultrasound 12/19/2016, CT abdomen pelvis 10/26/2018 FINDINGS: Gallbladder: Surgically absent. Common bile duct: Diameter: 0.3 cm Liver: No focal lesion identified. The parenchymal echotexture is mildly heterogeneous with mildly nodular contour in keeping with history of cirrhosis. Portal vein is patent on color Doppler imaging with normal direction of blood flow towards the liver. Other: Probable small right pleural effusion. IMPRESSION: 1. Appearance of the liver compatible with cirrhosis. No focal liver lesion identified. 2.  Probable small right pleural effusion. Electronically Signed   By: Audie Pinto M.D.   On: 10/27/2018 11:40    EKG:   Orders placed or performed during the hospital encounter of 10/25/18  . EKG 12-Lead  . EKG 12-Lead  . EKG 12-Lead  . EKG 12-Lead    ASSESSMENT AND PLAN:  63 y.o. 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 Delana Meyer, and CKD to the ED with worsening shortness of breath and fall.  1. Sepsis -present on admission - chest x-ray shows no active cardiopulmonary disease - Covid negative -Lactic acid at 6.1->3.9 - repeat lactic acid and monitor procalcitonin - CT abdomen and pelvis concerning for acute pyelonephritis -right more than the left perinephric stranding - UA and culture pending - Blood cultures strep group C in 4 out of 4 bottles -Continue IV Rocephin for now. consult infectious disease  2.  Acute on chronic iron deficiency anemia -secondary to chronic blood loss from gastric and small bowel AVMs -s/p EGD (06/22/18) with oozing gastric ulcers treated with bipolar cautery, colonoscopy, capsule endoscopy as well as small bowel enteroscopy (06/2018) - s/p 2 unit blood transfusion - Hb 8.1 - Pantoprazole IV - GI following - Hold NSAIDs, steroids, ASA - some concern for hemolysis - will c/s onco (message sent to Dr Mike Gip), check LDH, Haptoglobin  3. Elevated troponin -likely demand ischemia - Continue to trend troponin  4. DM: sugars have been well-controlled on current regimen  - hemoglobin A1c 1 6/5 was 5.9 - sliding scale insulin coverage in addition to Lantus - ADA 2100 calorie diet    5. Chronic congestive heart failure with reduced EF - stable Echocardiogram with moderate LVH and EF at 40 to45% and diffuse hypokinesis noted. Patient has had bioprosthesis for aortic valve - Hold Lasix, Demadex, Coreg in the setting of hypotension - Low salt diet   6. COPD -noevidence of exacerbation.  - Supplemental O2, goal sat 88-92% - Duonebs Q 6 hours - Continue home inhalers  7. Acute on chronic Kidney Disease - worsening Creat 3.41 - Hold nephrotoxins, Nephrology following -  Continue to monitor renal function  8.  Anasarca -She had been on diuretics at home but had to be hold due to worsening kidney function here -Getting right upper quadrant abdominal ultrasound with a history of hepatitis C -Nephrology started Albumin which should help along with blood transfusion for voiding issue  9.weakness-PT eval    All the records are reviewed and case discussed with Care Management/Social Worker. Management plans discussed with the patient, nursing and they are in agreement.  CODE STATUS: Full code  TOTAL TIME TAKING CARE OF THIS PATIENT: 35  minutes.   POSSIBLE D/C IN 2-3 DAYS, DEPENDING ON CLINICAL CONDITION.  Note: This dictation was prepared with Dragon dictation along with smaller phrase technology. Any transcriptional errors that result from this process are unintentional.   Max Sane M.D on 10/28/2018 at 9:37 AM  Between 7am to 6pm - Pager - [phone] After 6pm go to [email or web address] - password [email or web address] [facility]  Office  [phone]  CC: Primary care physician; [place]

## 2018-10-28 NOTE — Consult Note (Signed)
[facility] Cardiology  CARDIOLOGY CONSULT NOTE  Patient ID: SIERRAH LUEVANO MRN: [id number] DOB/AGE: 1955-12-06 63 y.o.  Admit date: 10/25/2018 Referring Physician Manuella Ghazi Primary Physician [facility] Primary Cardiologist [place] / [place] Reason for Consultation bacteremia, request for transesophageal echocardiogram  HPI: 63 year old female with bacteremia, request for transesophageal echocardiogram.  Patient has known coronary artery disease status post CABG, with ischemic cardiomyopathy, and history of bioprosthetic aortic valve.  She presented to [facility] ED 10/25/2018 for falling, to be hypoxic.  Lactic acid was elevated and blood cultures were sent which were positive for  streptococcus group C.  2D echocardiogram was performed which revealed normal left ventricular function, with LVEF 55 to 60%, moderately thickened mitral valve leaflets, unable to rule out vegetation.  Admission labs labs notable for borderline elevated troponin ( 19,19 ) in the absence of chest pain most consistent with demand supply ischemia.  Patient also has anemia, with hemoglobin hematocrit of 8.1 and 27.2, respectively.  Patient has history of GI bleed, with gastric ulcers, status post  bipolar cautery.  Review of systems complete and found to be negative unless listed above     Past Medical History:  Diagnosis Date  . Cervical cancer ([place])   . CHF (congestive heart failure) ([place])   . Chronic anemia   . COPD (chronic obstructive pulmonary disease) ([place])   . Diabetes mellitus without complication ([place])   . Gastric ulcer   . NSTEMI (non-ST elevated myocardial infarction) ([place])   . Upper GI bleed     Past Surgical History:  Procedure Laterality Date  . AMPUTATION Left 06/24/2018   Procedure: AMPUTATION GREAT TOE;  Surgeon: Samara Deist, DPM;  Location: [facility] ORS;  Service: Podiatry;  Laterality: Left;  . APPENDECTOMY    . CARDIAC CATHETERIZATION    . CHOLECYSTECTOMY    . COLONOSCOPY N/A 09/28/2017   Procedure:  COLONOSCOPY;  Surgeon: Toledo, Benay Pike, MD;  Location: [facility] ENDOSCOPY;  Service: Gastroenterology;  Laterality: N/A;  . COLONOSCOPY WITH PROPOFOL N/A 08/21/2018   Procedure: COLONOSCOPY WITH PROPOFOL;  Surgeon: Lin Landsman, MD;  Location: [facility] ENDOSCOPY;  Service: Gastroenterology;  Laterality: N/A;  . ENTEROSCOPY Left 11/06/2017   Procedure: ENTEROSCOPY;  Surgeon: Jonathon Bellows, MD;  Location: [facility] ENDOSCOPY;  Service: Gastroenterology;  Laterality: Left;  . ENTEROSCOPY N/A 06/22/2018   Procedure: ENTEROSCOPY;  Surgeon: Lin Landsman, MD;  Location: [facility] ENDOSCOPY;  Service: Gastroenterology;  Laterality: N/A;  . ENTEROSCOPY N/A 08/18/2018   Procedure: ENTEROSCOPY;  Surgeon: Jonathon Bellows, MD;  Location[facility] ENDOSCOPY;  Service: Gastroenterology;  Laterality: N/A;  . ESOPHAGOGASTRODUODENOSCOPY N/A 12/19/2016   Procedure: ESOPHAGOGASTRODUODENOSCOPY (EGD);  Surgeon: Lin Landsman, MD;  Location: [facility] ENDOSCOPY;  Service: Gastroenterology;  Laterality: N/A;  . ESOPHAGOGASTRODUODENOSCOPY N/A 09/28/2017   Procedure: ESOPHAGOGASTRODUODENOSCOPY (EGD);  Surgeon: Toledo, Benay Pike, MD;  Location: [facility] ENDOSCOPY;  Service: Gastroenterology;  Laterality: N/A;  . ESOPHAGOGASTRODUODENOSCOPY (EGD) WITH PROPOFOL N/A 08/08/2017   Procedure: ESOPHAGOGASTRODUODENOSCOPY (EGD) WITH PROPOFOL;  Surgeon: [name], MD;  Location: [facility] ENDOSCOPY;  Service: Endoscopy;  Laterality: N/A;  . [place]    . LEFT HEART CATH AND CORONARY ANGIOGRAPHY N/A 12/23/2016   Procedure: LEFT HEART CATH AND CORONARY ANGIOGRAPHY;  Surgeon: Teodoro Spray, MD;  Location: [place] CV LAB;  Service: Cardiovascular;  Laterality: N/A;  . LOWER EXTREMITY ANGIOGRAPHY Left 06/26/2018   Procedure: Lower Extremity Angiography;  Surgeon: Katha Cabal, MD;  Location: [place] CV LAB;  Service: Cardiovascular;  Laterality: Left;    Medications  Prior to Admission  Medication Sig Dispense Refill Last  Dose  . atorvastatin (LIPITOR) 40 MG tablet Take 40 mg by mouth daily.   Unknown at Unknown  . carvedilol (COREG) 3.125 MG tablet Take 1 tablet (3.125 mg total) by mouth 2 (two) times daily with a meal. 60 tablet 0 Unknown at Unknown  . ferrous sulfate 325 (65 FE) MG tablet Take 1 tablet (325 mg total) by mouth 2 (two) times daily with a meal. 60 tablet 0 Unknown at Unknown  . fluticasone (FLONASE) 50 MCG/ACT nasal spray Place 2 sprays into both nostrils daily.   Unknown at Unknown  . folic acid (FOLVITE) 1 MG tablet Take 1 tablet (1 mg total) by mouth daily. 30 tablet 0 Unknown at Unknown  . furosemide (LASIX) 40 MG tablet Take 1 tablet (40 mg total) by mouth daily. (Patient taking differently: Take 40 mg by mouth daily as needed. Take if weight is increased (Per instructions)) 30 tablet 11 Unknown at Unknown  . Insulin Glargine (LANTUS SOLOSTAR) 100 UNIT/ML Solostar Pen Inject 15 Units into the skin daily. (Patient taking differently: Inject 16 Units into the skin at bedtime. ) 300 mL 0 Unknown at Unknown  . lisinopril (ZESTRIL) 5 MG tablet Take 5 mg by mouth daily.   Unknown at Unknown  . omeprazole (PRILOSEC) 40 MG capsule Take 1 capsule (40 mg total) by mouth 2 (two) times a day. 120 capsule 1 Unknown at Unknown  . pantoprazole (PROTONIX) 40 MG tablet Take 40 mg by mouth 2 (two) times daily.   Unknown at Unknown  . pregabalin (LYRICA) 150 MG capsule Take 150 mg by mouth 2 (two) times daily.   Unknown at Unknown  . PROAIR HFA 108 (90 Base) MCG/ACT inhaler Inhale 1-2 puffs into the lungs every 4 (four) hours as needed.  2 prn at prn  . spironolactone (ALDACTONE) 25 MG tablet Take 12.5 mg by mouth daily.   Unknown at Unknown  . SYMBICORT 160-4.5 MCG/ACT inhaler Inhale 2 puffs into the lungs 2 (two) times daily.  2 Unknown at Unknown  . torsemide (DEMADEX) 20 MG tablet Take 20 mg by mouth daily. Take 2 tablets if weight is greater than 185 lbs. NO tablets if weight is less than 177 lbs.   Unknown at  Unknown  . Multiple Vitamin (MULTIVITAMIN WITH MINERALS) TABS tablet Take 1 tablet by mouth daily. (Patient not taking: Reported on 10/25/2018) 30 tablet 0 Not Taking at Unknown  . thiamine 100 MG tablet Take 1 tablet (100 mg total) by mouth daily. (Patient not taking: Reported on 10/25/2018) 30 tablet 0 Not Taking at Unknown time   Social History   Socioeconomic History  . Marital status: Married    Spouse name: Not on file  . Number of children: Not on file  . Years of education: Not on file  . Highest education level: Not on file  Occupational History  . Not on file  Social Needs  . Financial resource strain: Not on file  . Food insecurity    Worry: Not on file    Inability: Not on file  . Transportation needs    Medical: Not on file    Non-medical: Not on file  Tobacco Use  . Smoking status: Current Some Day Smoker    Packs/day: 0.25    Types: Cigarettes  . Smokeless tobacco: Never Used  Substance and Sexual Activity  . Alcohol use: No  . Drug use: No  . Sexual activity: Not on file  Lifestyle  . Physical activity    Days per week: Not on file    Minutes per session: Not on file  . Stress: Not on file  Relationships  . Social Herbalist on phone: Not on file    Gets together: Not on file    Attends religious service: Not on file    Active member of club or organization: Not on file    Attends meetings of clubs or organizations: Not on file    Relationship status: Not on file  . Intimate partner violence    Fear of current or ex partner: Not on file    Emotionally abused: Not on file    Physically abused: Not on file    Forced sexual activity: Not on file  Other Topics Concern  . Not on file  Social History Narrative  . Not on file    Family History  Problem Relation Age of Onset  . Hypertension Mother   . Diabetes Mellitus II Mother   . Breast cancer Mother   . Heart disease Father   . Cancer Sister       Review of systems complete and found  to be negative unless listed above      PHYSICAL EXAM  General: Well developed, well nourished, in no acute distress HEENT:  Normocephalic and atramatic Neck:  No JVD.  Lungs: Clear bilaterally to auscultation and percussion. Heart: HRRR . Normal S1 and S2 without gallops or murmurs.  Abdomen: Bowel sounds are positive, abdomen soft and non-tender  Msk:  Back normal, normal gait. Normal strength and tone for age. Extremities: No clubbing, cyanosis or edema.   Neuro: Alert and oriented X 3. Psych:  Good affect, responds appropriately  Labs:   Lab Results  Component Value Date   WBC 17.8 (H) 10/28/2018   HGB 8.1 (L) 10/28/2018   HCT 27.2 (L) 10/28/2018   MCV 81.4 10/28/2018   PLT 193 10/28/2018    Recent Labs  Lab 10/27/18 0350 10/28/18 0048  NA 135 134*  K 4.8 5.0  CL 109 108  CO2 19* 19*  BUN 33* 39*  CREATININE 2.95* 3.41*  CALCIUM 7.9* 7.9*  PROT 7.0  --   BILITOT 2.5*  --   ALKPHOS 74  --   ALT 9  --   AST 17  --   GLUCOSE 98 105*   Lab Results  Component Value Date   CKTOTAL 124 09/28/2006   CKMB 1.3 09/28/2006   TROPONINI <0.03 01/14/2018   No results found for: CHOL No results found for: HDL No results found for: [facility] Lab Results  Component Value Date   TRIG 178 (H) 12/18/2016   No results found for: CHOLHDL No results found for: LDLDIRECT    Radiology: Ct Abdomen Pelvis Wo Contrast  Result Date: 10/26/2018 CLINICAL DATA:  Abdominal pain, gastroenteritis or colitis EXAM: CT ABDOMEN AND PELVIS WITHOUT CONTRAST TECHNIQUE: Multidetector CT imaging of the abdomen and pelvis was performed following the standard protocol without IV contrast. COMPARISON:  June 21, 2018 FINDINGS: Lower chest: There is moderate cardiomegaly. Small bilateral pleural effusions are seen, right greater than left with peripheral atelectasis. Hepatobiliary: Although limited due to the lack of intravenous contrast, normal in appearance without gross focal abnormality. The  patient is status post cholecystectomy. No biliary ductal dilation. Pancreas:  Unremarkable.  No surrounding inflammatory changes. Spleen: Normal in size. Although limited due to the lack of intravenous contrast, normal in appearance. Adrenals/Urinary Tract: Both  adrenal glands appear normal. Again noted is a 6 cm low-density lesion lower pole of the left kidney brought to pull left-sided low-density lesions are seen. Bilateral perinephric stranding is seen. This appears to be more prominent than the prior exam, and is asymmetric, right greater than left. Stomach/Bowel: The stomach and small bowel are normal in size and contour. There is a moderate amount of colonic stool. Scattered colonic diverticula are noted without diverticulitis. Vascular/Lymphatic: There are no enlarged abdominal or pelvic lymph nodes. Scattered aortic atherosclerotic calcifications are seen without aneurysmal dilatation. Reproductive: The uterus and adnexa are unremarkable. Other: There is diffuse extensive anasarca seen. There is a small fat containing anterior umbilical hernia. A small amount of perihepatic ascites is seen. Musculoskeletal: No acute or significant osseous findings. IMPRESSION: 1. Diffuse extensive anasarca. 2. Small bilateral pleural effusions, slightly increased in size from the prior exam. 3. Small amount of perihepatic ascites. 4. Bilateral perinephric stranding, right greater than left, increased from the prior exam. This could be due to acute pyelonephritis. 5. Colonic diverticula without diverticulitis. Electronically Signed   By: Prudencio Pair M.D.   On: 10/26/2018 03:12   Dg Chest Portable 1 View  Result Date: 10/25/2018 CLINICAL DATA:  Fall EXAM: PORTABLE CHEST 1 VIEW COMPARISON:  08/29/2018 FINDINGS: Mild cardiomegaly with prosthetic valve and left atrial closure device. No pulmonary edema or focal airspace consolidation. No pleural effusion or pneumothorax. IMPRESSION: No active cardiopulmonary disease.  Electronically Signed   By: Ulyses Jarred M.D.   On: 10/25/2018 21:14   US Abdomen Limited Ruq  Result Date: 10/27/2018 CLINICAL DATA:  Cirrhosis EXAM: ULTRASOUND ABDOMEN LIMITED RIGHT UPPER QUADRANT COMPARISON:  Abdominal ultrasound 12/19/2016, CT abdomen pelvis 10/26/2018 FINDINGS: Gallbladder: Surgically absent. Common bile duct: Diameter: 0.3 cm Liver: No focal lesion identified. The parenchymal echotexture is mildly heterogeneous with mildly nodular contour in keeping with history of cirrhosis. Portal vein is patent on color Doppler imaging with normal direction of blood flow towards the liver. Other: Probable small right pleural effusion. IMPRESSION: 1. Appearance of the liver compatible with cirrhosis. No focal liver lesion identified. 2.  Probable small right pleural effusion. Electronically Signed   By: Audie Pinto M.D.   On: 10/27/2018 11:40    EKG: Sinus tachycardia with right bundle branch block  ASSESSMENT AND PLAN:   1.  Streptococcal bacteremia, remarkable 2D echocardiogram with the exception of mitral valve leaflet thickening 2.  Known CAD, status post CABG 01/2017 without chest pain or new ECG changes 3.  Borderline elevated troponin, likely demand supply ischemia 4.  Status post prostatic aortic valve replacement 5.  Anemia  Recommendations  1.  Agree with current therapy 2.  Proceed with transesophageal echocardiogram in a.m.[name] Kitchen  Risk, benefits alternatives explained to the patient and informed consent was obtained.  Signed: Isaias Cowman MD,PhD, [facility] 10/28/2018, 3:39 PM

## 2018-10-29 ENCOUNTER — Inpatient Hospital Stay
Admit: 2018-10-29 | Discharge: 2018-10-29 | Disposition: A | Discharge status: Home / Self Care | Payer: Medicaid Other | Attending: Cardiology | Admitting: Cardiology

## 2018-10-29 ENCOUNTER — Encounter
Admission: EM | Disposition: A | Discharge status: Skilled Nursing Facility | Payer: Self-pay | Source: Home / Self Care | Attending: Internal Medicine

## 2018-10-29 ENCOUNTER — Encounter: Payer: Self-pay | Admitting: Gastroenterology

## 2018-10-29 HISTORY — PX: TEE WITHOUT CARDIOVERSION: SHX5443

## 2018-10-29 LAB — GLUCOSE, CAPILLARY
Glucose-Capillary: 130 mg/dL — ABNORMAL HIGH (ref 70–99)
Glucose-Capillary: 92 mg/dL (ref 70–99)
Glucose-Capillary: 99 mg/dL (ref 70–99)
Glucose-Capillary: 99 mg/dL (ref 70–99)
Glucose-Capillary: 140 mg/dL — ABNORMAL HIGH (ref 70–99)

## 2018-10-29 LAB — POTASSIUM: Potassium: 5.1 mmol/L (ref 3.5–5.1)

## 2018-10-29 LAB — BASIC METABOLIC PANEL
Anion gap: 8 (ref 5–15)
BUN: 43 mg/dL — ABNORMAL HIGH (ref 8–23)
CO2: 17 mmol/L — ABNORMAL LOW (ref 22–32)
Calcium: 7.8 mg/dL — ABNORMAL LOW (ref 8.9–10.3)
Chloride: 109 mmol/L (ref 98–111)
Creatinine, Ser: 3.54 mg/dL — ABNORMAL HIGH (ref 0.44–1.00)
GFR calc Af Amer: 15 mL/min — ABNORMAL LOW (ref >60)
GFR calc non Af Amer: 13 mL/min — ABNORMAL LOW (ref >60)
Glucose, Bld: 136 mg/dL — ABNORMAL HIGH (ref 70–99)
Potassium: 6.3 mmol/L (ref 3.5–5.1)
Sodium: 134 mmol/L — ABNORMAL LOW (ref 135–145)

## 2018-10-29 LAB — DAT, POLYSPECIFIC AHG (ARMC ONLY): Polyspecific AHG test: NEGATIVE

## 2018-10-29 LAB — HAPTOGLOBIN: Haptoglobin: 148 mg/dL (ref 37–355)

## 2018-10-29 SURGERY — ECHOCARDIOGRAM, TRANSESOPHAGEAL
Anesthesia: Moderate Sedation

## 2018-10-29 MED ORDER — SODIUM CHLORIDE 0.9 % IV SOLN
2.0000 g | Freq: Three times a day (TID) | INTRAVENOUS | Status: DC
  Administered 2018-10-29 – 2018-11-04 (×19): 2 g via INTRAVENOUS
  Filled 2018-10-29 (×4): qty 2
  Filled 2018-10-29: qty 2000
  Filled 2018-10-29: qty 2
  Filled 2018-10-29 (×3): qty 2000
  Filled 2018-10-29 (×12): qty 2

## 2018-10-29 MED ORDER — LIDOCAINE VISCOUS HCL 2 % MT SOLN
OROMUCOSAL | Status: AC
  Administered 2018-10-29: 13:00:00
  Filled 2018-10-29: qty 15

## 2018-10-29 MED ORDER — FENTANYL CITRATE (PF) 100 MCG/2ML IJ SOLN
INTRAMUSCULAR | Status: AC | PRN
  Administered 2018-10-29: 50 ug via INTRAVENOUS

## 2018-10-29 MED ORDER — DEXTROSE 50 % IV SOLN
50.0000 mL | Freq: Once | INTRAVENOUS | Status: AC
  Administered 2018-10-29: 06:00:00 50 mL via INTRAVENOUS
  Filled 2018-10-29: qty 50

## 2018-10-29 MED ORDER — BUTAMBEN-TETRACAINE-BENZOCAINE 2-2-14 % EX AERO
INHALATION_SPRAY | CUTANEOUS | Status: AC
  Administered 2018-10-29: 2
  Filled 2018-10-29: qty 5

## 2018-10-29 MED ORDER — PREGABALIN 75 MG PO CAPS
75.0000 mg | ORAL_CAPSULE | Freq: Two times a day (BID) | ORAL | Status: DC
  Administered 2018-10-29 – 2018-11-07 (×19): 75 mg via ORAL
  Filled 2018-10-29 (×19): qty 1

## 2018-10-29 MED ORDER — INSULIN GLARGINE 100 UNIT/ML ~~LOC~~ SOLN
10.0000 [IU] | Freq: Every day | SUBCUTANEOUS | Status: DC
  Administered 2018-10-29 – 2018-11-06 (×9): 10 [IU] via SUBCUTANEOUS
  Filled 2018-10-29 (×10): qty 0.1

## 2018-10-29 MED ORDER — SODIUM CHLORIDE FLUSH 0.9 % IV SOLN
INTRAVENOUS | Status: AC
  Filled 2018-10-29: qty 10

## 2018-10-29 MED ORDER — FENTANYL CITRATE (PF) 100 MCG/2ML IJ SOLN
INTRAMUSCULAR | Status: AC
  Filled 2018-10-29: qty 2

## 2018-10-29 MED ORDER — INSULIN ASPART 100 UNIT/ML IV SOLN
10.0000 [IU] | Freq: Once | INTRAVENOUS | Status: AC
  Administered 2018-10-29: 06:00:00 10 [IU] via INTRAVENOUS
  Filled 2018-10-29: qty 0.1

## 2018-10-29 MED ORDER — MIDAZOLAM HCL 5 MG/5ML IJ SOLN
INTRAMUSCULAR | Status: AC
  Filled 2018-10-29: qty 5

## 2018-10-29 MED ORDER — MIDAZOLAM HCL 2 MG/2ML IJ SOLN
INTRAMUSCULAR | Status: AC | PRN
  Administered 2018-10-29: 2 mg via INTRAVENOUS

## 2018-10-29 MED ORDER — ALBUTEROL SULFATE (2.5 MG/3ML) 0.083% IN NEBU
2.5000 mg | INHALATION_SOLUTION | RESPIRATORY_TRACT | Status: AC
  Filled 2018-10-29: qty 3

## 2018-10-29 MED ORDER — CALCIUM GLUCONATE-NACL 1-0.675 GM/50ML-% IV SOLN
1.0000 g | Freq: Once | INTRAVENOUS | Status: AC
  Administered 2018-10-29: 06:00:00 1000 mg via INTRAVENOUS
  Filled 2018-10-29: qty 50

## 2018-10-29 NOTE — Progress Notes (Signed)
Notified Elizabeth Ouma K+ 6.3

## 2018-10-29 NOTE — Progress Notes (Signed)
   10/29/18 1100  Clinical Encounter Type  Visited With Patient;Health care provider  Visit Type Initial  Referral From Care management  Consult/Referral To Chaplain  Stress Factors  Patient Stress Factors Health changes   Chaplain received a referral to see the patient from Lianne Cure, Nurse Case Manager. Upon arrival, the patient was laying in bed with the lights off and the head of the bed elevated. She stated that she had been resting, but was open to the visit. The patient reported that she was "doing okay", but had been better. Her presenting concern was that she "wanted to sit up." This chaplain engaged the patient in brief conversation to understand her needs and offered to relay this need to the patient's nurse. This chaplain inquired with the patient about her positioning and if she would not be able to sit up due to a medical concern. Upon connecting with the patient's nurse, this chaplain learned that the patient's K+ level is elevated, she has a procedure scheduled today and, will need PT to assist with repositioning. This chaplain relayed this to the patient and provided encouragement. The patient expressed disappointment and reported that she "just wanted to sit on the side of bed and allow her feet to dangle." The patient also expressed a spiritual need "to go home." The chaplain and patient talked about what home is like, who important people are in her life, and she shared some of her familial history. This chaplain provided support in the form of active and reflective listening, encouragement, theological reflection, and prayer before she was taken to her procedure.   Plans for follow-up: Support the patient's spiritual health and encourage her active involvement in her care.

## 2018-10-29 NOTE — Progress Notes (Signed)
2 attempts to alert spouse of transfer

## 2018-10-29 NOTE — Progress Notes (Signed)
PT Cancellation Note  Patient Details Name: [name] MRN: [id number] DOB: 05/07/1955   Cancelled Treatment:    Reason Eval/Treat Not Completed: Medical issues which prohibited therapy.  Chart reviewed.  Pt's potassium noted to be elevated to 6.3 this morning.  Per PT guidelines for elevated potassium, PT currently contra-indicated.  Will hold PT at this time and re-attempt PT evaluation at a later date/time as medically appropriate.  Leitha Bleak, PT 10/29/18, 8:36 AM [phone]

## 2018-10-29 NOTE — Consult Note (Addendum)
Name: [name] MRN: [id number] DOB: [birth year]     CONSULTATION DATE: 10/29/2018  CHIEF COMPLAINT:  Sepsis  SYNOPSIS: [name] is a 63 yo female with past medical history of CHF, anemia, COPD, NSTEMI and diabetes mellitus type 2 originally presented to the ED on 10/8 complaining of SOB and falling down off the toilet and not being able to get up. Pt was diagnosed with sepsis because she was tachypneic, tachycardic, feverish, hypotension, and elevated WBC. She was started on empiric antibiotics and transferred to the med-surg unit. On 10/12, pt transferred to ICU because of elevated potassium (6.3).  SIGNIFICANT EVENTS/STUDIES:  10/8 Presented to ED with complaints of shortness of breath and fall at home. Diagnosed with septic shock and admitted to the med-surg unit 10/12 Transferred to ICU with hyperkalemia (6.3), which resolved upon recheck to 5.1   HISTORY OF PRESENT ILLNESS:   [name] has been transferred to the ICU for hyperkalemia. She has no complaints at present, denies all review of systems, and reports feeling well overall.  PAST MEDICAL HISTORY :   has a past medical history of Cervical cancer ([place]), CHF (congestive heart failure) ([place]), Chronic anemia, COPD (chronic obstructive pulmonary disease) ([place]), Diabetes mellitus without complication ([place]), Gastric ulcer, NSTEMI (non-ST elevated myocardial infarction) ([place]), and Upper GI bleed.  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 Prior to Admission medications   Medication Sig Start Date End Date Taking? Authorizing Provider  atorvastatin (LIPITOR) 40 MG tablet Take 40 mg by mouth daily.   Yes [provider]  carvedilol (COREG) 3.125 MG tablet Take 1 tablet (3.125 mg total) by mouth 2 (two) times daily with a meal. 08/21/18  Yes Ojie, Jude, MD  ferrous sulfate 325 (65 FE) MG tablet Take 1 tablet (325 mg total) by mouth 2 (two) times daily with a meal. 08/21/18  Yes Ojie, Jude, MD  fluticasone (FLONASE) 50 MCG/ACT nasal spray Place 2 sprays into both nostrils daily.   Yes [provider]  folic acid (FOLVITE) 1 MG tablet Take 1 tablet (1 mg total) by mouth daily. 08/21/18  Yes Ojie, Jude, MD  furosemide (LASIX) 40 MG tablet Take 1 tablet (40 mg total) by mouth daily. Patient taking differently: Take 40 mg by mouth daily as needed. Take if weight is increased (Per instructions) 01/16/18 01/16/19 Yes Gladstone Lighter, MD  Insulin Glargine (LANTUS SOLOSTAR) 100 UNIT/ML Solostar Pen Inject 15 Units into the skin daily. Patient taking differently: Inject 16 Units into the skin at bedtime.  08/29/17  Yes Dustin Flock, MD  lisinopril (ZESTRIL) 5 MG tablet Take 5 mg by mouth daily.   Yes [provider]  omeprazole (PRILOSEC) 40 MG capsule Take 1 capsule (40 mg total) by mouth 2 (two) times a day. 08/15/18 10/25/18 Yes Vanga, Tally Due, MD  pantoprazole (PROTONIX) 40 MG tablet Take 40 mg by mouth 2 (two) times daily.   Yes [provider]  pregabalin (LYRICA) 150 MG capsule Take 150 mg by mouth 2 (two) times daily. 08/03/18  Yes [provider]  PROAIR HFA 108 (90 Base) MCG/ACT inhaler Inhale 1-2 puffs into the lungs every 4 (four) hours as needed.  10/10/17  Yes [provider]  spironolactone (ALDACTONE) 25 MG tablet Take 12.5 mg by mouth daily.   Yes [provider]  SYMBICORT 160-4.5 MCG/ACT inhaler Inhale 2  puffs into the lungs 2 (two) times daily. 10/10/17  Yes [provider]  torsemide (DEMADEX) 20 MG tablet Take 20 mg by mouth daily. Take 2 tablets if weight is greater than 185 lbs. NO tablets if weight is less than 177 lbs.   Yes [provider]  Multiple Vitamin (MULTIVITAMIN WITH MINERALS) TABS tablet Take 1 tablet by mouth daily. Patient not taking: Reported on 10/25/2018 06/28/18   Lang Snow, NP  thiamine 100 MG tablet Take 1 tablet (100 mg total) by mouth daily. Patient not taking: Reported on 10/25/2018 06/28/18   Lang Snow, NP   Allergies  Allergen Reactions  . Latex Anaphylaxis and Rash  . Gabapentin Nausea And Vomiting and Other (See Comments)    Review of Systems:  Gen:  Denies  fever, sweats, chills weight loss  HEENT: Denies blurred vision, double vision, ear pain, eye pain, hearing loss, nose bleeds, sore throat Cardiac:  No dizziness, chest pain or heaviness, chest tightness,edema, No JVD Resp:   No cough, -sputum production, -shortness of breath,-wheezing, -hemoptysis,  Gi: Denies swallowing difficulty, stomach pain, nausea or vomiting, diarrhea, constipation, bowel incontinence Gu:  Denies bladder incontinence, burning urine Ext:   Denies Joint pain, stiffness or swelling Skin: Denies  skin rash, easy bruising or bleeding or hives Endoc:  Denies polyuria, polydipsia , polyphagia or weight change Psych:   Denies depression, insomnia or hallucinations  Other:  All other systems negative   VITAL SIGNS: Temp:  [97.4 F (36.3 C)-98.8 F (37.1 C)] 97.4 F (36.3 C) (10/12 1518) Pulse Rate:  [75-90] 81 (10/12 1350) Resp:  [12-26] 20 (10/12 1518) BP: (114-146)/(54-97) 128/66 (10/12 1518) SpO2:  [94 %-100 %] 100 % (10/12 1518)   I/O last 3 completed shifts: In: 1131.5 [P.O.:240; I.V.:191.5; Blood:700] Out: 2200 [Urine:2200] Total I/O In: -  Out: 600 [Urine:600]   SpO2: 100 % O2 Flow Rate (L/min): 2 L/min   Physical  Examination:  GENERAL:critically ill appearing, no resp distress HEAD: Normocephalic, atraumatic.  EYES: Pupils equal, round, reactive to light.  No scleral icterus.  MOUTH: Moist mucosal membrane. NECK: Supple. No JVD.  PULMONARY: lungs clear to auscultation CARDIOVASCULAR: S1 and S2. Regular rate and rhythm. No murmurs, rubs, or gallops.  GASTROINTESTINAL: Soft, nontender, -distended. No masses. Positive bowel sounds. No hepatosplenomegaly.  MUSCULOSKELETAL: No swelling, clubbing, or edema.  NEUROLOGIC: A&Ox4, no focal deficits SKIN:intact,warm,dry  I personally reviewed lab work that was obtained in last 24 hrs. CXR Independently reviewed  MEDICATIONS: I have reviewed all medications and confirmed regimen as documented   CULTURE RESULTS   Recent Results (from the past 240 hour(s))  Blood culture (routine x 2)     Status: Abnormal   Collection Time: 10/25/18  8:38 PM   Specimen: BLOOD  Result Value Ref Range Status   Specimen Description   Final    BLOOD LEFT ANTECUBITAL Performed at [facility], [address], [place], [place] [address]    Special Requests   Final    BOTTLES DRAWN AEROBIC AND ANAEROBIC Blood Culture results may not be optimal due to an excessive volume of blood received in culture bottles Performed at [facility], [address] [place], [place] [address]    Culture  Setup Time   Final    [place]  IN BOTH AEROBIC AND ANAEROBIC BOTTLES CRITICAL RESULT CALLED TO, READ BACK BY AND VERIFIED WITH: CHARLES SHANLEVER @0835  ON 10/26/2018 BY FMW Performed at [facility], [place] [address] [place], [place] [address]    Culture STREPTOCOCCUS GROUP C (A)  Final   Report Status 10/28/2018 FINAL  Final   Organism ID, Bacteria STREPTOCOCCUS GROUP C  Final      Susceptibility   Streptococcus group c - MIC*    CLINDAMYCIN <=0.25 SENSITIVE Sensitive     AMPICILLIN <=0.25 SENSITIVE Sensitive     ERYTHROMYCIN <=0.12 SENSITIVE  Sensitive     VANCOMYCIN 0.5 SENSITIVE Sensitive     CEFTRIAXONE <=0.12 SENSITIVE Sensitive     LEVOFLOXACIN 0.5 SENSITIVE Sensitive     PENICILLIN Value in next row Sensitive      SENSITIVE<=0.06    * STREPTOCOCCUS GROUP C  Blood culture (routine x 2)     Status: Abnormal   Collection Time: 10/25/18  8:38 PM   Specimen: BLOOD  Result Value Ref Range Status   Specimen Description   Final    BLOOD RIGHT ANTECUBITAL Performed at [facility], [address] [place], [place] [address]    Special Requests   Final    BOTTLES DRAWN AEROBIC AND ANAEROBIC Blood Culture results may not be optimal due to an excessive volume of blood received in culture bottles Performed at [facility], [address] [place], [place] [address]    Culture  Setup Time   Final    GRAM POSITIVE COCCI IN BOTH AEROBIC AND ANAEROBIC BOTTLES CRITICAL VALUE NOTED.  VALUE IS CONSISTENT WITH PREVIOUSLY REPORTED AND CALLED VALUE. Performed at [facility], [address] [place], [place] [address]    Culture (A)  Final    STREPTOCOCCUS GROUP C SUSCEPTIBILITIES PERFORMED ON PREVIOUS CULTURE WITHIN THE LAST 5 DAYS. Performed at [facility], [place] [address], [place], [place] [address]    Report Status 10/28/2018 FINAL  Final  Blood Culture ID Panel (Reflexed)     Status: Abnormal   Collection Time: 10/25/18  8:38 PM  Result Value Ref Range Status   Enterococcus species NOT DETECTED NOT DETECTED Final   Listeria monocytogenes NOT DETECTED NOT DETECTED Final   Staphylococcus species NOT DETECTED NOT DETECTED Final   Staphylococcus aureus (BCID) NOT DETECTED NOT DETECTED Final   Streptococcus species DETECTED (A) NOT DETECTED Final    Comment: Not Enterococcus species, Streptococcus agalactiae, Streptococcus pyogenes, or Streptococcus pneumoniae. CRITICAL RESULT CALLED TO, READ BACK BY AND VERIFIED WITH: CHARLES SHANLEVER @0835  ON 10/26/2018 BY FMW    Streptococcus agalactiae  NOT DETECTED NOT DETECTED Final   Streptococcus pneumoniae NOT DETECTED NOT DETECTED Final   Streptococcus pyogenes NOT DETECTED NOT DETECTED Final   Acinetobacter baumannii NOT DETECTED NOT DETECTED Final   Enterobacteriaceae species NOT DETECTED NOT DETECTED Final   Enterobacter cloacae complex NOT DETECTED NOT DETECTED Final   Escherichia coli NOT DETECTED NOT DETECTED Final   Klebsiella oxytoca NOT DETECTED NOT DETECTED Final   Klebsiella pneumoniae NOT DETECTED NOT DETECTED Final   Proteus species NOT DETECTED NOT DETECTED Final   Serratia marcescens NOT DETECTED NOT DETECTED Final   Haemophilus influenzae NOT DETECTED NOT DETECTED Final   Neisseria meningitidis NOT DETECTED NOT DETECTED Final   Pseudomonas aeruginosa NOT DETECTED NOT DETECTED Final   Candida albicans NOT DETECTED NOT DETECTED Final   Candida glabrata NOT DETECTED NOT DETECTED Final   Candida krusei NOT DETECTED NOT DETECTED Final  Candida parapsilosis NOT DETECTED NOT DETECTED Final   Candida tropicalis NOT DETECTED NOT DETECTED Final    Comment: Performed at [facility], [address] [place], [place] [address]  SARS Coronavirus 2 by RT PCR (hospital order, performed in [facility] hospital lab) Nasopharyngeal Nasopharyngeal Swab     Status: None   Collection Time: 10/25/18  8:49 PM   Specimen: Nasopharyngeal Swab  Result Value Ref Range Status   SARS Coronavirus 2 NEGATIVE NEGATIVE Final    Comment: (NOTE) If result is NEGATIVE SARS-CoV-2 target nucleic acids are NOT DETECTED. The SARS-CoV-2 RNA is generally detectable in upper and lower  respiratory specimens during the acute phase of infection. The lowest  concentration of SARS-CoV-2 viral copies this assay can detect is 250  copies / mL. A negative result does not preclude SARS-CoV-2 infection  and should not be used as the sole basis for treatment or other  patient management decisions.  A negative result may occur with  improper specimen  collection / handling, submission of specimen other  than nasopharyngeal swab, presence of viral mutation(s) within the  areas targeted by this assay, and inadequate number of viral copies  (<250 copies / mL). A negative result must be combined with clinical  observations, patient history, and epidemiological information. If result is POSITIVE SARS-CoV-2 target nucleic acids are DETECTED. The SARS-CoV-2 RNA is generally detectable in upper and lower  respiratory specimens dur ing the acute phase of infection.  Positive  results are indicative of active infection with SARS-CoV-2.  Clinical  correlation with patient history and other diagnostic information is  necessary to determine patient infection status.  Positive results do  not rule out bacterial infection or co-infection with other viruses. If result is PRESUMPTIVE POSTIVE SARS-CoV-2 nucleic acids MAY BE PRESENT.   A presumptive positive result was obtained on the submitted specimen  and confirmed on repeat testing.  While 2019 novel coronavirus  (SARS-CoV-2) nucleic acids may be present in the submitted sample  additional confirmatory testing may be necessary for epidemiological  and / or clinical management purposes  to differentiate between  SARS-CoV-2 and other Sarbecovirus currently known to infect humans.  If clinically indicated additional testing with an alternate test  methodology [phone]) is advised. The SARS-CoV-2 RNA is generally  detectable in upper and lower respiratory sp ecimens during the acute  phase of infection. The expected result is Negative. Fact Sheet for Patients:  StrictlyIdeas.no Fact Sheet for Healthcare Providers: BankingDealers.co.za This test is not yet approved or cleared by the [place] FDA and has been authorized for detection and/or diagnosis of SARS-CoV-2 by FDA under an Emergency Use Authorization (EUA).  This EUA will remain in effect  (meaning this test can be used) for the duration of the COVID-19 declaration under Section 564(b)(1) of the Act, 21 U.S.C. section 360bbb-3(b)(1), unless the authorization is terminated or revoked sooner. Performed at [facility], [address] [place],  [address]   MRSA PCR Screening     Status: Abnormal   Collection Time: 10/26/18 11:09 AM   Specimen: Nasopharyngeal  Result Value Ref Range Status   MRSA by PCR POSITIVE (A) NEGATIVE Final    Comment:        The GeneXpert MRSA Assay (FDA approved for NASAL specimens only), is one component of a comprehensive MRSA colonization surveillance program. It is not intended to diagnose MRSA infection nor to guide or monitor treatment for MRSA infections. RESULT CALLED TO, READ BACK BY AND VERIFIED  WITH: Saundra Shelling AT 7096 10/26/2018.PMF Performed at [facility], [address] [place], [place] [address]   CULTURE, BLOOD (ROUTINE X 2) w Reflex to ID Panel     Status: None (Preliminary result)   Collection Time: 10/28/18 12:48 AM   Specimen: BLOOD  Result Value Ref Range Status   Specimen Description BLOOD RIGHT ANTECUBITAL  Final   Special Requests   Final    BOTTLES DRAWN AEROBIC AND ANAEROBIC Blood Culture adequate volume   Culture   Final    NO GROWTH 1 DAY Performed at [facility], [address], [place], [place] [address]    Report Status PENDING  Incomplete  CULTURE, BLOOD (ROUTINE X 2) w Reflex to ID Panel     Status: None (Preliminary result)   Collection Time: 10/28/18  5:06 AM   Specimen: BLOOD  Result Value Ref Range Status   Specimen Description BLOOD LEFT WRIST  Final   Special Requests   Final    BOTTLES DRAWN AEROBIC AND ANAEROBIC Blood Culture adequate volume   Culture   Final    NO GROWTH 1 DAY Performed at [facility], [address], [place], [place] [address]    Report Status PENDING  Incomplete     ASSESSMENT AND PLAN SYNOPSIS 63 yo female  admitted to the ICU with diagnosis of sepsis confirmed with blood cultures +group C streptococcus and +MRSA PCR, with bilateral small pleural effusions indicating possible pneumosepsis.   SHOCK-SEPSIS Due to strep bacteremia Significantly improved, not currently on vasopressors  -use vasopressors to keep MAP>65 -follow ABG and LA -follow up cultures -continue antibiotics -consider stress dose steroids -IV fluid resuscitation as needed  HYPERKALEMIA -  resolved - continue monitoring BMP intermittently    CHRONIC SYSTOLIC CARDIAC FAILURE- EF 50-55% -oxygen as needed -Lasix as needed  -follow up cardiac enzymes as indicated -follow up cardiology recs  ACUTE KIDNEY INJURY/Renal Failure -follow chem 7 -follow UO -Avoid nephrotoxic agents  CARDIAC ICU monitoring  ID -continue IV abx as prescibed -follow up cultures  GI GI PROPHYLAXIS as indicated  ENDO - will use ICU hypoglycemic\Hyperglycemia protocol if needed  ELECTROLYTES -follow labs as needed -replace as needed -pharmacy consultation and following  DVT/GI PRX ordered TRANSFUSIONS AS NEEDED MONITOR FSBS ASSESS the need for LABS    Nicholas Lose, PA-S   Ottie Glazier, M.D.  Pulmonary & Critical Care Medicine  [facility]   Critical care provider statement:    Critical care time (minutes):  32   Critical care time was exclusive of:  Separately billable procedures and  treating other patients   Critical care was necessary to treat or prevent imminent or  life-threatening deterioration of the following conditions:   Septic shock due to strep bacteremia, acute blood loss anemia, demand ischemia, diabetes, chronic systolic CHF, multiple comorbid conditions   Critical care was time spent personally by me on the following  activities:  Development of treatment plan with patient or surrogate,  discussions with consultants, evaluation of patient's response to  treatment, examination of  patient, obtaining history from patient or  surrogate, ordering and performing treatments and interventions, ordering  and review of laboratory studies and re-evaluation of patient's condition   I assumed direction of critical care for this patient from another  provider in my specialty: no

## 2018-10-29 NOTE — Progress Notes (Signed)
   10/29/18 1400  Clinical Encounter Type  Visited With Patient not available  Visit Type Follow-up  Referral From Chaplain  Consult/Referral To [place] text   Chaplain followed up with the patient post-procedure, as agreed. Upon arrival, the patient was observed to be sleeping soundly. This chaplain left the Bible on the bedside table for the patient. Will follow up at a later time.

## 2018-10-29 NOTE — Progress Notes (Addendum)
[name] NAME: [name]    MR#:  [id number]  DATE OF BIRTH:  [birth year]  SUBJECTIVE:  worsening kidney function, weak, K 6.3 earlier and now 5.1 after treatment, waiting for TEE when I saw REVIEW OF SYSTEMS:  CONSTITUTIONAL: No fever, reports fatigue or weakness.  EYES: No blurred or double vision.  EARS, NOSE, AND THROAT: No tinnitus or ear pain.  RESPIRATORY: No cough, shortness of breath, wheezing or hemoptysis.  CARDIOVASCULAR: No chest pain, orthopnea, edema.  GASTROINTESTINAL: No nausea, vomiting, diarrhea or abdominal pain.  GENITOURINARY: No dysuria, hematuria.  ENDOCRINE: No polyuria, nocturia,  HEMATOLOGY: No anemia, easy bruising or bleeding SKIN: No rash or lesion. MUSCULOSKELETAL: No joint pain or arthritis.   NEUROLOGIC: No tingling, numbness, weakness.  PSYCHIATRY: No anxiety or depression.   DRUG ALLERGIES:   Allergies  Allergen Reactions  . Latex Anaphylaxis and Rash  . Gabapentin Nausea And Vomiting and Other (See Comments)    VITALS:  Blood pressure (!) 124/54, pulse 81, temperature 97.6 F (36.4 C), temperature source Oral, resp. rate 15, height 5' 8"  (1.727 m), weight 125.1 kg, SpO2 100 %. PHYSICAL EXAMINATION:  GENERAL:  63 y.o.-year-old patient lying in the bed with no acute distress.  EYES: Pupils equal, round, reactive to light and accommodation. No scleral icterus. Extraocular muscles intact.  HEENT: Head atraumatic, normocephalic. Oropharynx and nasopharynx clear.  NECK:  Supple, no jugular venous distention. No thyroid enlargement, no tenderness.  LUNGS: Normal breath sounds bilaterally, no wheezing, rales,rhonchi or crepitation. No use of accessory muscles of respiration.  CARDIOVASCULAR: S1, S2 normal. No murmurs, rubs, or gallops.  ABDOMEN: Soft, nontender, nondistended. Bowel sounds present.  EXTREMITIES: No pedal edema, cyanosis, or clubbing.  NEUROLOGIC: Patient is  arousable oriented x3 sensation intact. Gait not checked.  PSYCHIATRIC: The patient is alert and oriented x3.  SKIN: No obvious rash, lesion, or ulcer.  LABORATORY PANEL:   CBC Recent Labs  Lab 10/28/18 0048  WBC 17.8*  HGB 8.1*  HCT 27.2*  PLT 193   ------------------------------------------------------------------------------------------------------------------  Chemistries  Recent Labs  Lab 10/27/18 0350  10/29/18 0410 10/29/18 0904  NA 135   < > 134*  --   K 4.8   < > 6.3* 5.1  CL 109   < > 109  --   CO2 19*   < > 17*  --   GLUCOSE 98   < > 136*  --   BUN 33*   < > 43*  --   CREATININE 2.95*   < > 3.54*  --   CALCIUM 7.9*   < > 7.8*  --   AST 17  --   --   --   ALT 9  --   --   --   ALKPHOS 74  --   --   --   BILITOT 2.5*  --   --   --    < > = values in this interval not displayed.   ------------------------------------------------------------------------------------------------------------------  Cardiac Enzymes No results for input(s): TROPONINI in the last 168 hours. ------------------------------------------------------------------------------------------------------------------  RADIOLOGY:  No results found.  EKG:   Orders placed or performed during the hospital encounter of 10/25/18  . EKG 12-Lead  . EKG 12-Lead  . EKG 12-Lead  . EKG 12-Lead    ASSESSMENT AND PLAN:  63 y.o. 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 Delana [name], and CKD to the ED with worsening shortness of breath and fall.  1. Sepsis with multiorgan failure -sepsis present on admission - chest x-ray shows no active cardiopulmonary disease - Covid negative - CT abdomen and pelvis concerning for acute pyelonephritis -right more than the  left perinephric stranding - Blood cultures strep group C in 4 out of 4 bottles - ID switched rocephin to ampicillin - TEE neg for any valve vegetations  2.  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 Non bleeding AVM seen in duodenum - Hold NSAIDs, steroids, ASA - some concern for hemolysis - will c/s onco (message sent to Dr [name]), check LDH, Haptoglobin  3. Elevated troponin -likely demand ischemia - Continue to trend troponin  4. DM: sugars have been well-controlled on current regimen  - hemoglobin A1c 1 6/5 was 5.9 - sliding scale insulin coverage in addition to Lantus, cut back on dose of lantus as sugars running low - ADA 2100 calorie diet    5. Chronic congestive heart failure with reduced EF - stable Echocardiogram with moderate LVH and EF at 40 to45% and diffuse hypokinesis noted. Patient has had bioprosthesis for aortic valve - Hold Lasix, Demadex, Coreg in the setting of hypotension - Low salt diet   6. COPD -noevidence of exacerbation.  - Supplemental O2, goal sat 88-92% - Duonebs Q 6 hours - Continue home inhalers  7. Acute on chronic Kidney Disease 3 - worsening Creat 3.41->3.54 - Hold nephrotoxins, Nephrology following - Continue to monitor renal function - reduce Lyrica dose  8.  Anasarca -She had been on diuretics at home but had to be hold due to worsening kidney function here -Getting right upper quadrant abdominal ultrasound with a history of hepatitis C -Nephrology started Albumin which should help along with blood transfusion for voiding issue  9.weakness-PT eval  10. Hyperkalemia: treated with reversal agent and K  6.3-> 5.1    Her MEWS score trending up per RN - will transfer her to SD/ICU for close monitoring. High risk for clinical decompensation including cardio-resp failure, multiorgan failure and death.  She is critically sick    All the records are reviewed and case discussed with Care Management/Social Worker. Management plans discussed with the patient, nursing and they are in agreement.  CODE STATUS: Full code  TOTAL TIME (Critical Care) TAKING CARE OF THIS PATIENT: 35  minutes.   POSSIBLE D/C IN 2-3 DAYS, DEPENDING ON CLINICAL CONDITION.  Note: This dictation was prepared with Dragon dictation along with smaller phrase technology. Any transcriptional errors that result from this process are unintentional.   Max Sane M.D on 10/29/2018 at 2:30 PM  Between 7am to 6pm - Pager - [phone] After 6pm go to [email or web address] - password [email or web address] [facility]  Office  [phone]  CC: Primary care physician; [place]

## 2018-10-29 NOTE — Progress Notes (Signed)
   Date of Admission:  10/25/2018   Subjective: Patient is back from TEE and sleeping  Medications:  . sodium chloride   Intravenous Once  . atorvastatin  40 mg Oral Daily  . Chlorhexidine Gluconate Cloth  6 each Topical Q0600  . fluticasone  2 spray Each Nare Daily  . folic acid  1 mg Oral Daily  . insulin aspart  0-5 Units Subcutaneous QHS  . insulin aspart  0-9 Units Subcutaneous TID WC  . insulin glargine  16 Units Subcutaneous QHS  . magnesium citrate  1 Bottle Oral Once  . midodrine  5 mg Oral TID WC  . mometasone-formoterol  2 puff Inhalation BID  . mupirocin ointment  1 application Nasal BID  . pantoprazole  40 mg Intravenous Q12H  . pregabalin  150 mg Oral BID    Objective: Vital signs in last 24 hours: Temp:  [97.6 F (36.4 C)-98.8 F (37.1 C)] 97.6 F (36.4 C) (10/12 1017) Pulse Rate:  [75-82] 82 (10/12 1017) Resp:  [14-20] 17 (10/12 1017) BP: (121-134)/(57-80) 121/57 (10/12 1017) SpO2:  [100 %] 100 % (10/12 1017)  PHYSICAL EXAM:  General: Asleep   Lab Results Recent Labs    10/27/18 0350 10/28/18 0048 10/29/18 0410 10/29/18 0904  WBC 17.9* 17.8*  --   --   HGB 6.6* 8.1*  --   --   HCT 22.1* 27.2*  --   --   NA 135 134* 134*  --   K 4.8 5.0 6.3* 5.1  CL 109 108 109  --   CO2 19* 19* 17*  --   BUN 33* 39* 43*  --   CREATININE 2.95* 3.41* 3.54*  --    Liver Panel Recent Labs    10/27/18 0350  PROT 7.0  ALBUMIN 2.2*  AST 17  ALT 9  ALKPHOS 74  BILITOT 2.5*  BILIDIR 1.4*   Sedimentation Rate No results for input(s): ESRSEDRATE in the last 72 hours. C-Reactive Protein No results for input(s): CRP in the last 72 hours.  Microbiology:  Studies/Results: US Abdomen Limited Ruq  Result Date: 10/27/2018 CLINICAL DATA:  Cirrhosis EXAM: ULTRASOUND ABDOMEN LIMITED RIGHT UPPER QUADRANT COMPARISON:  Abdominal ultrasound 12/19/2016, CT abdomen pelvis 10/26/2018 FINDINGS: Gallbladder: Surgically absent. Common bile duct: Diameter: 0.3 cm Liver: No  focal lesion identified. The parenchymal echotexture is mildly heterogeneous with mildly nodular contour in keeping with history of cirrhosis. Portal vein is patent on color Doppler imaging with normal direction of blood flow towards the liver. Other: Probable small right pleural effusion. IMPRESSION: 1. Appearance of the liver compatible with cirrhosis. No focal liver lesion identified. 2.  Probable small right pleural effusion. Electronically Signed   By: Audie Pinto M.D.   On: 10/27/2018 11:40     Assessment/Plan: Streptococcus group C bacteremia.  Unclear source.  Could be from the GI tract.  Currently on ceftriaxone.  De-escalate to ampicillin.  Because of bioprosthetic valve TEE was done and there was no endocarditis. Anemia.  Presented with hemoglobin of 6.6.  Received blood transfusion   AKI on CKD.  Rash on the arm near the cubital fossa very likely related to the blood pressure cuff.  Has not spread anywhere.  Treated hepatitis C with Harvoni in 2016.  Diabetes mellitus on insulin

## 2018-10-29 NOTE — CV Procedure (Addendum)
   TRANSESOPHAGEAL ECHOCARDIOGRAM   NAME:  [name]   MRN: [id number] DOB:  [birth year]   ADMIT DATE: 10/25/2018  INDICATIONS:  Endocarditis PROCEDURE:   Informed consent was obtained prior to the procedure. The risks, benefits and alternatives for the procedure were discussed and the patient comprehended these risks.  Risks include, but are not limited to, cough, sore throat, vomiting, nausea, somnolence, esophageal and stomach trauma or perforation, bleeding, low blood pressure, aspiration, pneumonia, infection, trauma to the teeth and death.    After a procedural time-out, the patient was given 2 mg versed and 50 mcg fentanyl to achieve moderate sedation for 10 min.  The oropharynx was anesthetized 3 cc of topical 1% viscous lidocaine.  The transesophageal probe was inserted in the esophagus and stomach without difficulty and multiple views were obtained. I was present for the entire procedure.    COMPLICATIONS:    There were no immediate complications.  FINDINGS:  LEFT VENTRICLE: EF = 55%. No regional wall motion abnormalities.  RIGHT VENTRICLE: Normal size and function.   LEFT ATRIUM: Moderate enlarged. No thrombus in appendage.   LEFT ATRIAL APPENDAGE: No thrombus.   RIGHT ATRIUM: Moderate enlargement  AORTIC VALVE:  Bioprosthetic. No vegetations. Mild ai  MITRAL VALVE:    Normal. Moderate mr. Degenerative disease. No vegetations  TRICUSPID VALVE: Normal.Moderate to severe tr. No vegations  PULMONIC VALVE: Grossly normal.  INTERATRIAL SEPTUM: No PFO or ASD. Agitated saline contrast was used.   PERICARDIUM: No effusion  DESCENDING AORTA:   CONCLUSION: Moderate mr, moderate to severe tr. No vegetations noted.

## 2018-10-29 NOTE — Progress Notes (Signed)
PT Cancellation Note  Patient Details Name: [name] MRN: [id number] DOB: 1955/09/27   Cancelled Treatment:    Reason Eval/Treat Not Completed: Patient at procedure or test/unavailable.  Pt's potassium improved to 5.1 but now pt is off floor for procedure.  Will re-attempt PT evaluation at a later date/time.   Leitha Bleak, PT 10/29/18, 12:43 PM [phone]

## 2018-10-29 NOTE — Progress Notes (Signed)
*  PRELIMINARY RESULTS* Echocardiogram Echocardiogram Transesophageal has been performed.  Sherrie Sport 10/29/2018, 1:03 PM

## 2018-10-29 NOTE — Progress Notes (Signed)
[name], MD [address] [place], [place], [place], [address] [address], [place], [place], [place], [address] Phone: [phone]  Fax: [phone]   Subjective: No active GI bleeding.  No abdominal pain.  No nausea or vomiting.   Objective: Exam: Vital signs in last 24 hours: Vitals:   10/29/18 0007 10/29/18 0801 10/29/18 0939 10/29/18 1017  BP: 134/70 125/70 128/70 (!) 121/57  Pulse: 78 79 80 82  Resp: 14 17  17   Temp: 98.5 F (36.9 C) 97.7 F (36.5 C) 97.7 F (36.5 C) 97.6 F (36.4 C)  TempSrc: Oral Oral  Oral  SpO2: 100% 100% 100% 100%  Weight:      Height:       Weight change:   Intake/Output Summary (Last 24 hours) at 10/29/2018 1159 Last data filed at 10/29/2018 0002 Gross per 24 hour  Intake 431.49 ml  Output 1400 ml  Net -968.51 ml    General: No acute distress, AAO x3 Abd: Soft, NT/ND, No HSM Skin: Warm, no rashes Neck: Supple, Trachea midline   Lab Results: Lab Results  Component Value Date   WBC 17.8 (H) 10/28/2018   HGB 8.1 (L) 10/28/2018   HCT 27.2 (L) 10/28/2018   MCV 81.4 10/28/2018   PLT 193 10/28/2018   Micro Results: Recent Results (from the past 240 hour(s))  Blood culture (routine x 2)     Status: Abnormal   Collection Time: 10/25/18  8:38 PM   Specimen: BLOOD  Result Value Ref Range Status   Specimen Description   Final    BLOOD LEFT ANTECUBITAL Performed at [facility], [address] [place], [place] [address]    Special Requests   Final    BOTTLES DRAWN AEROBIC AND ANAEROBIC Blood Culture results may not be optimal due to an excessive volume of blood received in culture bottles Performed at [facility], [address] [place], [place] [address]    Culture  Setup Time   Final    GRAM POSITIVE COCCI IN BOTH AEROBIC AND ANAEROBIC BOTTLES CRITICAL RESULT CALLED TO, READ BACK BY AND VERIFIED WITH: CHARLES SHANLEVER @0835  ON 10/26/2018 BY FMW Performed at [facility],  [place] [address], [place], [place] [address]    Culture STREPTOCOCCUS GROUP C (A)  Final   Report Status 10/28/2018 FINAL  Final   Organism ID, Bacteria STREPTOCOCCUS GROUP C  Final      Susceptibility   Streptococcus group c - MIC*    CLINDAMYCIN <=0.25 SENSITIVE Sensitive     AMPICILLIN <=0.25 SENSITIVE Sensitive     ERYTHROMYCIN <=0.12 SENSITIVE Sensitive     VANCOMYCIN 0.5 SENSITIVE Sensitive     CEFTRIAXONE <=0.12 SENSITIVE Sensitive     LEVOFLOXACIN 0.5 SENSITIVE Sensitive     PENICILLIN Value in next row Sensitive      SENSITIVE<=0.06    * STREPTOCOCCUS GROUP C  Blood culture (routine x 2)     Status: Abnormal   Collection Time: 10/25/18  8:38 PM   Specimen: BLOOD  Result Value Ref Range Status   Specimen Description   Final    BLOOD RIGHT ANTECUBITAL Performed at [facility], [address], [place], [place] [address]    Special Requests   Final    BOTTLES DRAWN AEROBIC AND ANAEROBIC Blood Culture results may not be optimal due to an excessive volume of blood received in culture bottles Performed at [facility], [address] [place], [place] [address]  Culture  Setup Time   Final    GRAM POSITIVE COCCI IN BOTH AEROBIC AND ANAEROBIC BOTTLES CRITICAL VALUE NOTED.  VALUE IS CONSISTENT WITH PREVIOUSLY REPORTED AND CALLED VALUE. Performed at [facility], [address] [place], [place] [address]    Culture (A)  Final    STREPTOCOCCUS GROUP C SUSCEPTIBILITIES PERFORMED ON PREVIOUS CULTURE WITHIN THE LAST 5 DAYS. Performed at [facility], [place] [address] [place], [place] [address]    Report Status 10/28/2018 FINAL  Final  Blood Culture ID Panel (Reflexed)     Status: Abnormal   Collection Time: 10/25/18  8:38 PM  Result Value Ref Range Status   Enterococcus species NOT DETECTED NOT DETECTED Final   Listeria monocytogenes NOT DETECTED NOT DETECTED Final   Staphylococcus species NOT DETECTED NOT DETECTED Final   Staphylococcus aureus  (BCID) NOT DETECTED NOT DETECTED Final   Streptococcus species DETECTED (A) NOT DETECTED Final    Comment: Not Enterococcus species, Streptococcus agalactiae, Streptococcus pyogenes, or Streptococcus pneumoniae. CRITICAL RESULT CALLED TO, READ BACK BY AND VERIFIED WITH: CHARLES SHANLEVER @0835  ON 10/26/2018 BY FMW    Streptococcus agalactiae NOT DETECTED NOT DETECTED Final   Streptococcus pneumoniae NOT DETECTED NOT DETECTED Final   Streptococcus pyogenes NOT DETECTED NOT DETECTED Final   Acinetobacter baumannii NOT DETECTED NOT DETECTED Final   Enterobacteriaceae species NOT DETECTED NOT DETECTED Final   Enterobacter cloacae complex NOT DETECTED NOT DETECTED Final   Escherichia coli NOT DETECTED NOT DETECTED Final   Klebsiella oxytoca NOT DETECTED NOT DETECTED Final   Klebsiella pneumoniae NOT DETECTED NOT DETECTED Final   Proteus species NOT DETECTED NOT DETECTED Final   Serratia marcescens NOT DETECTED NOT DETECTED Final   Haemophilus influenzae NOT DETECTED NOT DETECTED Final   Neisseria meningitidis NOT DETECTED NOT DETECTED Final   Pseudomonas aeruginosa NOT DETECTED NOT DETECTED Final   Candida albicans NOT DETECTED NOT DETECTED Final   Candida glabrata NOT DETECTED NOT DETECTED Final   Candida krusei NOT DETECTED NOT DETECTED Final   Candida parapsilosis NOT DETECTED NOT DETECTED Final   Candida tropicalis NOT DETECTED NOT DETECTED Final    Comment: Performed at [facility], [address] [place], [place] [address]  SARS Coronavirus 2 by RT PCR (hospital order, performed in [place] hospital lab) Nasopharyngeal Nasopharyngeal Swab     Status: None   Collection Time: 10/25/18  8:49 PM   Specimen: Nasopharyngeal Swab  Result Value Ref Range Status   SARS Coronavirus 2 NEGATIVE NEGATIVE Final    Comment: (NOTE) If result is NEGATIVE SARS-CoV-2 target nucleic acids are NOT DETECTED. The SARS-CoV-2 RNA is generally detectable in upper and lower  respiratory  specimens during the acute phase of infection. The lowest  concentration of SARS-CoV-2 viral copies this assay can detect is 250  copies / mL. A negative result does not preclude SARS-CoV-2 infection  and should not be used as the sole basis for treatment or other  patient management decisions.  A negative result may occur with  improper specimen collection / handling, submission of specimen other  than nasopharyngeal swab, presence of viral mutation(s) within the  areas targeted by this assay, and inadequate number of viral copies  (<250 copies / mL). A negative result must be combined with clinical  observations, patient history, and epidemiological information. If result is POSITIVE SARS-CoV-2 target nucleic acids are DETECTED. The SARS-CoV-2 RNA is generally detectable in upper and lower  respiratory specimens dur ing the acute phase  of infection.  Positive  results are indicative of active infection with SARS-CoV-2.  Clinical  correlation with patient history and other diagnostic information is  necessary to determine patient infection status.  Positive results do  not rule out bacterial infection or co-infection with other viruses. If result is PRESUMPTIVE POSTIVE SARS-CoV-2 nucleic acids MAY BE PRESENT.   A presumptive positive result was obtained on the submitted specimen  and confirmed on repeat testing.  While 2019 novel coronavirus  (SARS-CoV-2) nucleic acids may be present in the submitted sample  additional confirmatory testing may be necessary for epidemiological  and / or clinical management purposes  to differentiate between  SARS-CoV-2 and other Sarbecovirus currently known to infect humans.  If clinically indicated additional testing with an alternate test  methodology [phone]) is advised. The SARS-CoV-2 RNA is generally  detectable in upper and lower respiratory sp ecimens during the acute  phase of infection. The expected result is Negative. Fact Sheet for  Patients:  StrictlyIdeas.no Fact Sheet for Healthcare Providers: BankingDealers.co.za This test is not yet approved or cleared by the [place] FDA and has been authorized for detection and/or diagnosis of SARS-CoV-2 by FDA under an Emergency Use Authorization (EUA).  This EUA will remain in effect (meaning this test can be used) for the duration of the COVID-19 declaration under Section 564(b)(1) of the Act, 21 U.S.C. section 360bbb-3(b)(1), unless the authorization is terminated or revoked sooner. Performed at [facility], [address] [place], [place] [address]   MRSA PCR Screening     Status: Abnormal   Collection Time: 10/26/18 11:09 AM   Specimen: Nasopharyngeal  Result Value Ref Range Status   MRSA by PCR POSITIVE (A) NEGATIVE Final    Comment:        The GeneXpert MRSA Assay (FDA approved for NASAL specimens only), is one component of a comprehensive MRSA colonization surveillance program. It is not intended to diagnose MRSA infection nor to guide or monitor treatment for MRSA infections. RESULT CALLED TO, READ BACK BY AND VERIFIED WITH: EMILY GANNON AT 1630 10/26/2018.PMF Performed at [facility], [address] [place], [place] [address]   CULTURE, BLOOD (ROUTINE X 2) w Reflex to ID Panel     Status: None (Preliminary result)   Collection Time: 10/28/18 12:48 AM   Specimen: BLOOD  Result Value Ref Range Status   Specimen Description BLOOD RIGHT ANTECUBITAL  Final   Special Requests   Final    BOTTLES DRAWN AEROBIC AND ANAEROBIC Blood Culture adequate volume   Culture   Final    NO GROWTH 1 DAY Performed at [facility], [address] [place], [place] [address]    Report Status PENDING  Incomplete  CULTURE, BLOOD (ROUTINE X 2) w Reflex to ID Panel     Status: None (Preliminary result)   Collection Time: 10/28/18  5:06 AM   Specimen: BLOOD  Result Value Ref Range Status    Specimen Description BLOOD LEFT WRIST  Final   Special Requests   Final    BOTTLES DRAWN AEROBIC AND ANAEROBIC Blood Culture adequate volume   Culture   Final    NO GROWTH 1 DAY Performed at [facility], [address], [place], [place] [address]    Report Status PENDING  Incomplete   Studies/Results: No results found. Medications:  Scheduled Meds: . sodium chloride   Intravenous Once  . atorvastatin  40 mg Oral Daily  . butamben-tetracaine-benzocaine      . Chlorhexidine  Gluconate Cloth  6 each Topical [id number]  . fentaNYL      . fluticasone  2 spray Each Nare Daily  . folic acid  1 mg Oral Daily  . insulin aspart  0-5 Units Subcutaneous QHS  . insulin aspart  0-9 Units Subcutaneous TID WC  . insulin glargine  16 Units Subcutaneous QHS  . lidocaine      . magnesium citrate  1 Bottle Oral Once  . midazolam      . midodrine  5 mg Oral TID WC  . mometasone-formoterol  2 puff Inhalation BID  . mupirocin ointment  1 application Nasal BID  . pantoprazole  40 mg Intravenous Q12H  . pregabalin  150 mg Oral BID  . sodium chloride flush       Continuous Infusions: . sodium chloride 130 mL/hr at 10/26/18 1500  . sodium chloride 20 mL/hr at 10/28/18 2214  . albumin human 12.5 g (10/29/18 0831)  . cefTRIAXone (ROCEPHIN)  IV 2 g (10/28/18 1425)   PRN Meds:.sodium chloride, acetaminophen, albuterol   Assessment: Anemia   Plan: Small bowel capsule study showed an area of erythema in the stomach with nonspecific red spots in the stomach, but no active bleeding. A nonbleeding AVM was noted in the duodenum.  Full report has been sent for scanning  I would recommend a push enteroscopy for evaluation of the stomach and treatment of the nonbleeding AVM.  However, I see that the patient was just transferred to the ICU today, for ?shortness of breath  Would recommend medical optimization prior to any endoscopic procedures  Since hemoglobin is stable and there has not been  any active GI bleeding the last 24 to 48 hours, endoscopic procedures are not urgent and can wait until other acute issues are addressed by ICU team  Continue serial CBCs and transfuse as needed Avoid NSAIDs Continue PPI twice daily   LOS: 3 days   [name], MD 10/29/2018, 11:59 AM

## 2018-10-29 NOTE — TOC Progression Note (Signed)
Transition of Care [facility]) - Progression Note    Patient Details  Name: [name] MRN: [id number] Date of Birth: 17-Oct-1955  Transition of Care [facility]) CM/SW [place], RN Phone Number: 10/29/2018, 11:34 AM  Clinical Narrative:     Met with the patient to discuss DC plan and needs She would like to go to rehab at DC, she said she does not get around real well at home I asked if she had other insurance aside from [place], she stated that she doe snot.  I asked if she gets any type of disability check, she said that yes she does.  I explained that rehab facilities may have her sign over the check for a month or however long she is there.  She stated understanding I asked what would be the back up plan if we are unable to find a facility for her.  She said that she would go home with Home health if she could not get into rehab. I called Doug with [facility] and asked him to take a look and see if they could do a bed offer.  He stated that he would call me back once it was reviewed  Expected Discharge Plan: [place] Barriers to Discharge: SNF Pending bed offer, Continued Medical Work up  Expected Discharge Plan and Services Expected Discharge Plan: [place] arrangements for the past 2 months: Single Family Home                                       Social Determinants of Health (SDOH) Interventions    Readmission Risk Interventions Readmission Risk Prevention Plan 10/26/2018 08/21/2018 09/30/2017  Transportation Screening Complete Complete Complete  PCP or Specialist Appt within 5-7 Days - - Complete  Home Care Screening - - Complete  Medication Review (RN CM) - - Complete  HRI or Home Care Consult Complete Complete -  Social Work Consult for [place] Planning/Counseling Not Complete - -  SW consult not completed comments not recovery care - -  Palliative Care Screening Not Applicable Not  Applicable -  Medication Review (RN Care Manager) Referral to Pharmacy Complete -  Some recent data might be hidden

## 2018-10-29 NOTE — TOC Initial Note (Signed)
Transition of Care [facility]) - Initial/Assessment Note    Patient Details  Name: [name] MRN: [id number] Date of Birth: 05-24-1955  Transition of Care [facility]) CM/SW Contact:    Su Hilt, RN Phone Number: 10/29/2018, 2:43 PM  Clinical Narrative:                  Called the [facility] and talked with Rachel Bo  He stated that the patient is set up with [facility] and is looking at doing Palliative I alerted [facility] that they can cancel the Referral as he is set up with [facility]  Expected Discharge Plan: Skilled Nursing Facility Barriers to Discharge: SNF Pending bed offer, Continued Medical Work up   Patient Goals and CMS Choice Patient states their goals for this hospitalization and ongoing recovery are:: go to rehab      Expected Discharge Plan and Services Expected Discharge Plan: [place] arrangements for the past 2 months: Single Family Home                                      Prior Living Arrangements/Services Living arrangements for the past 2 months: Single Family Home Lives with:: Spouse Patient language and need for interpreter reviewed:: No Do you feel safe going back to the place where you live?: Yes      Need for Family Participation in Patient Care: No (Comment)     Criminal Activity/Legal Involvement Pertinent to Current Situation/Hospitalization: No - Comment as needed  Activities of Daily Living Home Assistive Devices/Equipment: Environmental consultant (specify type), Wheelchair, CBG Meter ADL Screening (condition at time of admission) Patient's cognitive ability adequate to safely complete daily activities?: No Is the patient deaf or have difficulty hearing?: No Does the patient have difficulty seeing, even when wearing glasses/contacts?: No Does the patient have difficulty concentrating, remembering, or making decisions?: Yes Patient able to express need for assistance with ADLs?: No(only @ times) Does the patient have  difficulty dressing or bathing?: Yes Independently performs ADLs?: No Communication: Needs assistance Is this a change from baseline?: Pre-admission baseline Dressing (OT): Needs assistance Is this a change from baseline?: Pre-admission baseline Grooming: Needs assistance Is this a change from baseline?: Pre-admission baseline Feeding: Needs assistance Is this a change from baseline?: Pre-admission baseline Bathing: Needs assistance Is this a change from baseline?: Pre-admission baseline Toileting: Needs assistance Is this a change from baseline?: Pre-admission baseline In/Out Bed: Needs assistance Is this a change from baseline?: Pre-admission baseline Walks in Home: Needs assistance Is this a change from baseline?: Pre-admission baseline Does the patient have difficulty walking or climbing stairs?: Yes Weakness of Legs: Both Weakness of Arms/Hands: None  Permission Sought/Granted   Permission granted to share information with : Yes, Verbal Permission Granted              Emotional Assessment Appearance:: Appears older than stated age Attitude/Demeanor/Rapport: Engaged Affect (typically observed): Appropriate Orientation: : Oriented to Self, Oriented to Place, Oriented to  Time, Oriented to Situation Alcohol / Substance Use: Not Applicable Psych Involvement: No (comment)  Admission diagnosis:  Anemia, unspecified type [D64.9] Sepsis, due to unspecified organism, unspecified whether acute organ dysfunction present [facility]) [A41.9] Patient Active Problem List   Diagnosis Date Noted  . Pressure injury of skin 10/26/2018  . Anxiety 08/15/2018  . Chronic back pain 08/15/2018  . GERD (gastroesophageal reflux disease) 08/15/2018  . History  of carpal tunnel syndrome 08/15/2018  . HNP (herniated nucleus pulposus), lumbar 08/15/2018  . Hyperlipidemia 08/15/2018  . Hypertension 08/15/2018  . Insomnia 08/15/2018  . Left ventricular hypertrophy 08/15/2018  . Obesity 08/15/2018   . Sleep apnea 08/15/2018  . Severe anemia 06/20/2018  . Diabetic polyneuropathy associated with type 2 diabetes mellitus ([place]) 02/23/2018  . Moderate tricuspid regurgitation 02/18/2018  . Acute on chronic diastolic CHF (congestive heart failure) ([place]) 02/08/2018  . 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 Depression 02/05/2018  . Generalized abdominal pain 02/05/2018  . CHF (congestive heart failure) ([place]) 01/13/2018  . B12 deficiency 12/07/2017  . Iron deficiency anemia due to chronic blood loss 12/07/2017  . Folate deficiency 12/07/2017  . GIB (gastrointestinal bleeding) 11/03/2017  . COPD (chronic obstructive pulmonary disease) ([place]) 09/26/2017  . Symptomatic anemia 09/26/2017  . Acute on chronic systolic CHF (congestive heart failure) ([place]) 08/27/2017  . Anemia   . Weakness   . GI bleed 08/07/2017  . Chronic systolic heart failure ([place]) 12/28/2016  . Diabetes ([place]) 12/28/2016  . Tobacco use 12/28/2016  . NSTEMI (non-ST elevated myocardial infarction) ([place])   . Sepsis ([place]) 12/18/2016  . CHF exacerbation ([place]) 12/04/2015  . PNA (pneumonia) 10/28/2015  . COPD exacerbation ([place]) 08/29/2015  . Chronic hepatitis C without hepatic coma ([place]) 05/21/2014  . CAD (coronary artery disease) 07/19/2013  . Right arm pain 06/26/2013  . Cervical pain (neck) 03/28/2013  . Radiculopathy affecting upper extremity 03/28/2013  . Impaired renal function 12/21/2012  . Abnormal mammogram 12/20/2012  . Diabetes mellitus type 2, uncontrolled ([place]) 12/20/2012   PCP:  [facility]:   [place], [place], [place] [address] Phone: [phone] Fax: [place], [place] [address] [place] [address] Phone: [phone] Fax: [phone]  [id number]: [phone]:  [phone]    Social Determinants of Health (SDOH) Interventions    Readmission Risk Interventions Readmission Risk Prevention Plan 10/26/2018 08/21/2018 09/30/2017  Transportation Screening Complete Complete Complete  PCP or Specialist Appt within 5-7 Days - - Complete  Home Care Screening - - Complete  Medication Review (RN CM) - - Complete  HRI or Home Care Consult Complete Complete -  Social Work Consult [place]Counseling Not Complete - -  SW consult not completed comments not recovery care - -  Palliative Care Screening Not Applicable Not Applicable -  Medication Review (RN Care Manager) Referral to Pharmacy Complete -  Some recent data might be hidden

## 2018-10-30 ENCOUNTER — Inpatient Hospital Stay: Payer: Medicaid Other

## 2018-10-30 ENCOUNTER — Encounter: Payer: Self-pay | Admitting: Cardiology

## 2018-10-30 DIAGNOSIS — E875 Hyperkalemia: Secondary | ICD-10-CM

## 2018-10-30 DIAGNOSIS — Z8739 Personal history of other diseases of the musculoskeletal system and connective tissue: Secondary | ICD-10-CM

## 2018-10-30 DIAGNOSIS — D649 Anemia, unspecified: Secondary | ICD-10-CM

## 2018-10-30 DIAGNOSIS — K648 Other hemorrhoids: Secondary | ICD-10-CM

## 2018-10-30 DIAGNOSIS — K5520 Angiodysplasia of colon without hemorrhage: Secondary | ICD-10-CM

## 2018-10-30 LAB — BASIC METABOLIC PANEL
Anion gap: 9 (ref 5–15)
BUN: 41 mg/dL — ABNORMAL HIGH (ref 8–23)
CO2: 18 mmol/L — ABNORMAL LOW (ref 22–32)
Calcium: 7.8 mg/dL — ABNORMAL LOW (ref 8.9–10.3)
Chloride: 107 mmol/L (ref 98–111)
Creatinine, Ser: 3.11 mg/dL — ABNORMAL HIGH (ref 0.44–1.00)
GFR calc Af Amer: 18 mL/min — ABNORMAL LOW (ref >60)
GFR calc non Af Amer: 15 mL/min — ABNORMAL LOW (ref >60)
Glucose, Bld: 115 mg/dL — ABNORMAL HIGH (ref 70–99)
Potassium: 5.6 mmol/L — ABNORMAL HIGH (ref 3.5–5.1)
Sodium: 134 mmol/L — ABNORMAL LOW (ref 135–145)

## 2018-10-30 LAB — CBC
HCT: 26.7 % — ABNORMAL LOW (ref 36.0–46.0)
Hemoglobin: 8 g/dL — ABNORMAL LOW (ref 12.0–15.0)
MCH: 23.7 pg — ABNORMAL LOW (ref 26.0–34.0)
MCHC: 30 g/dL (ref 30.0–36.0)
MCV: 79 fL — ABNORMAL LOW (ref 80.0–100.0)
Platelets: 151 10*3/uL (ref 150–400)
RBC: 3.38 MIL/uL — ABNORMAL LOW (ref 3.87–5.11)
RDW: 22.2 % — ABNORMAL HIGH (ref 11.5–15.5)
WBC: 9.5 10*3/uL (ref 4.0–10.5)
nRBC: 0 % (ref 0.0–0.2)

## 2018-10-30 LAB — ANCA TITERS
Atypical P-ANCA titer: <1:20 {titer}
C-ANCA: <1:20 {titer}
P-ANCA: <1:20 {titer}

## 2018-10-30 LAB — GLUCOSE, CAPILLARY
Glucose-Capillary: 158 mg/dL — ABNORMAL HIGH (ref 70–99)
Glucose-Capillary: 142 mg/dL — ABNORMAL HIGH (ref 70–99)
Glucose-Capillary: 198 mg/dL — ABNORMAL HIGH (ref 70–99)

## 2018-10-30 LAB — ANA W/REFLEX IF POSITIVE: Anti Nuclear Antibody (ANA): NEGATIVE

## 2018-10-30 MED ORDER — INSULIN ASPART 100 UNIT/ML IV SOLN
5.0000 [IU] | Freq: Once | INTRAVENOUS | Status: AC
  Administered 2018-10-30: 5 [IU] via INTRAVENOUS
  Filled 2018-10-30: qty 0.05

## 2018-10-30 MED ORDER — PATIROMER SORBITEX CALCIUM 8.4 G PO PACK
16.8000 g | PACK | Freq: Once | ORAL | Status: AC
  Administered 2018-10-30: 16.8 g via ORAL
  Filled 2018-10-30: qty 2

## 2018-10-30 MED ORDER — DEXTROSE 50 % IV SOLN
12.5000 g | Freq: Once | INTRAVENOUS | Status: AC
  Administered 2018-10-30: 07:00:00 12.5 g via INTRAVENOUS
  Filled 2018-10-30: qty 50

## 2018-10-30 MED ORDER — FUROSEMIDE 10 MG/ML IJ SOLN
40.0000 mg | Freq: Two times a day (BID) | INTRAMUSCULAR | Status: DC
  Administered 2018-10-30 – 2018-11-06 (×15): 40 mg via INTRAVENOUS
  Filled 2018-10-30 (×15): qty 4

## 2018-10-30 MED ORDER — BISACODYL 10 MG RE SUPP
10.0000 mg | Freq: Once | RECTAL | Status: AC
  Administered 2018-10-30: 11:00:00 10 mg via RECTAL
  Filled 2018-10-30: qty 1

## 2018-10-30 NOTE — Progress Notes (Signed)
Date of Admission:  10/25/2018   Subjective: Patient was transferred to ICU because of hyperkalemia yesterday Patient says she is tired No fever   Medications:  . sodium chloride   Intravenous Once  . atorvastatin  40 mg Oral Daily  . Chlorhexidine Gluconate Cloth  6 each Topical Q0600  . fluticasone  2 spray Each Nare Daily  . folic acid  1 mg Oral Daily  . furosemide  40 mg Intravenous BID  . insulin aspart  0-5 Units Subcutaneous QHS  . insulin aspart  0-9 Units Subcutaneous TID WC  . insulin glargine  10 Units Subcutaneous QHS  . magnesium citrate  1 Bottle Oral Once  . midodrine  5 mg Oral TID WC  . mometasone-formoterol  2 puff Inhalation BID  . mupirocin ointment  1 application Nasal BID  . pantoprazole  40 mg Intravenous Q12H  . pregabalin  75 mg Oral BID    Objective: Vital signs in last 24 hours: Temp:  [97.4 F (36.3 C)-97.6 F (36.4 C)] 97.6 F (36.4 C) (10/13 0700) Pulse Rate:  [81-95] 90 (10/13 1100) Resp:  [11-33] 20 (10/13 1100) BP: (114-163)/(54-75) 147/54 (10/13 1100) SpO2:  [93 %-100 %] 100 % (10/13 1100) Weight:  [126.8 kg] 126.8 kg (10/12 1532)  PHYSICAL EXAM:  General: Awake but lethargic but appropriately answering questions Obese Neck: Supple, symmetrical, no adenopathy, thyroid: non tender Lungs: Bilateral air entry crepitations Sternal scar Heart: Systolic murmur  Abdomen: Soft, non-tender,not distended. Bowel sounds normal. No masses Extremities: Edema legs Left great toe amputation site is doing okay  Skin: No rashes or lesions. Or bruising Lymph: Cervical, supraclavicular normal. Neurologic: Grossly non-focal  Lab Results Recent Labs    10/28/18 0048 10/29/18 0410 10/29/18 0904 10/30/18 0447  WBC 17.8*  --   --  9.5  HGB 8.1*  --   --  8.0*  HCT 27.2*  --   --  26.7*  NA 134* 134*  --  134*  K 5.0 6.3* 5.1 5.6*  CL 108 109  --  107  CO2 19* 17*  --  18*  BUN 39* 43*  --  41*  CREATININE 3.41* 3.54*  --  3.11*    Liver Panel No results for input(s): PROT, ALBUMIN, AST, ALT, ALKPHOS, BILITOT, BILIDIR, IBILI in the last 72 hours. Sedimentation Rate No results for input(s): ESRSEDRATE in the last 72 hours. C-Reactive Protein No results for input(s): CRP in the last 72 hours.  Microbiology:  Studies/Results: Dg Abd 1 View  Result Date: 10/30/2018 CLINICAL DATA:  Pulmonary disease.  Possible ileus. EXAM: ABDOMEN - 1 VIEW COMPARISON:  CT 10/26/2018. FINDINGS: Prior median sternotomy, CABG, and cardiac valve replacement. Surgical clips right upper quadrant. Air-filled loops of small and large bowel noted. These are nondilated. Follow-up exam suggested to exclude developing bowel distention. No free air. Metallic density of unknown etiology noted over the left abdomen. Degenerative changes thoracolumbar spine and both hips. IMPRESSION: Air-filled loops of small large bowel noted. These are nondilated. Follow-up exam suggest to exclude developing bowel distention. Electronically Signed   By: Marcello Moores  Register   On: 10/30/2018 09:57   Dg Chest Port 1 View  Result Date: 10/30/2018 CLINICAL DATA:  Pulmonary disease.  Possible ileus. EXAM: PORTABLE CHEST 1 VIEW COMPARISON:  CT 10/26/2018.  Chest x-ray 10/25/2018. FINDINGS: Prior median sternotomy, CABG, and cardiac valve replacement. Stable cardiomegaly. Low lung volumes. Stay chronic bilateral mild interstitial prominence. No focal alveolar infiltrate. No pleural effusion or pneumothorax. IMPRESSION:  1. Prior median sternotomy, CABG, and cardiac valve replacement. Stable cardiomegaly. 2. Low lung volumes with stable chronic bilateral mild interstitial prominence. No acute alveolar infiltrate noted. Electronically Signed   By: Marcello Moores  Register   On: 10/30/2018 09:55     Assessment/Plan: Streptococcus group C bacteremia unclear source -could be from the GI tract.  This bacteria is less likely to cause urinary tract infection as the CAT scan is reported bilateral  perinephric stranding.  Repeat culture negative.  TEE negative for endocarditis. Currently on ampicillin will complete a two-week course.  Because of bioprosthetic valve TEE was done and there was no endocarditis.  Anemia presented with a hemoglobin of 6.6 has received multiple blood transfusion.  The last hemoglobin is 8.  She has a history of GI bleed.  She has history of GI bleed and has been worked up with colonoscopy on 09/28/2017 which showed internal hemorrhoids.  Capsule study on 2 11/04/2017 revealed 2 nonbleeding AVMs.  Small bowel endoscopy on 11/06/2017 revealed a single nonbleeding angiectasia in the jejunum and 3 bleeding angiectasia in the greater curvature of the gastric body treated with argon plasma anticoagulation. Small bowel enteroscopy on 06/22/2018 revealed a normal duodenum and jejunum.  There was oozing gastric ulcers with hemorrhage treated by bipolar cautery.  Small bowel enteroscopy done on 08/18/2018 revealed a normal esophagus jejunum.  There was an erythematous mucosa in the greater curvature of the stomach which was biopsied.  Colonoscopy done on 08/21/2018 revealed a normal colon.  AKA on CKD followed by renal.  Thought to be multifactorial.  Treated hepatitis C with [place] in 2016  Diabetes mellitus on insulin.  History of left great toe osteomyelitis status post amputation.  Discussed the management with her nurse.

## 2018-10-30 NOTE — Progress Notes (Signed)
PT Cancellation Note  Patient Details Name: [name] MRN: [id number] DOB: 06-19-1955   Cancelled Treatment:    Reason Eval/Treat Not Completed: Other (comment).  Chart reviewed.  Pt transferred to CCU 10/12.  D/t pt transferring to higher level of care, per PT protocol require new PT consult in order to continue therapy (will discontinue current PT order d/t this).  Pt's potassium also noted to be elevated to 5.6 this morning (therapy contra-indicated d/t elevated potassium).  Please re-consult PT when pt is medically appropriate to participate in PT.   Leitha Bleak, PT 10/30/18, 8:28 AM [phone]

## 2018-10-30 NOTE — Progress Notes (Signed)
Tanya Antigua, MD [address] [place], [place], [place], [address] [address] [place], [place], [place], [address] Phone: [phone]  Fax: [phone]   Subjective: Patient in ICU due to hyper kalemia and AKI   Objective: Exam: Vital signs in last 24 hours: Vitals:   10/30/18 1200 10/30/18 1300 10/30/18 1400 10/30/18 1500  BP: (!) 146/71 135/74 (!) 151/76 (!) 151/88  Pulse: 92 88 94 91  Resp: 12 16 19 15   Temp:      TempSrc:      SpO2: 94% 100% 100% 98%  Weight:      Height:       Weight change:   Intake/Output Summary (Last 24 hours) at 10/30/2018 1612 Last data filed at 10/30/2018 1300 Gross per 24 hour  Intake 880.16 ml  Output 1750 ml  Net -869.84 ml    General: No acute distress, AAO x3 Abd: Soft, NT/ND, No HSM Skin: Warm, no rashes Neck: Supple, Trachea midline   Lab Results: Lab Results  Component Value Date   WBC 9.5 10/30/2018   HGB 8.0 (L) 10/30/2018   HCT 26.7 (L) 10/30/2018   MCV 79.0 (L) 10/30/2018   PLT 151 10/30/2018   Micro Results: Recent Results (from the past 240 hour(s))  Blood culture (routine x 2)     Status: Abnormal   Collection Time: 10/25/18  8:38 PM   Specimen: BLOOD  Result Value Ref Range Status   Specimen Description   Final    BLOOD LEFT ANTECUBITAL Performed at [facility], [address] [place], [place] [address]    Special Requests   Final    BOTTLES DRAWN AEROBIC AND ANAEROBIC Blood Culture results may not be optimal due to an excessive volume of blood received in culture bottles Performed at [facility], [address] [place], [place] [address]    Culture  Setup Time   Final    GRAM POSITIVE COCCI IN BOTH AEROBIC AND ANAEROBIC BOTTLES CRITICAL RESULT CALLED TO, READ BACK BY AND VERIFIED WITH: CHARLES SHANLEVER @0835  ON 10/26/2018 BY FMW Performed at [facility], [place] [address] [place], [place] [address]    Culture STREPTOCOCCUS GROUP C (A)  Final   Report  Status 10/28/2018 FINAL  Final   Organism ID, Bacteria STREPTOCOCCUS GROUP C  Final      Susceptibility   Streptococcus group c - MIC*    CLINDAMYCIN <=0.25 SENSITIVE Sensitive     AMPICILLIN <=0.25 SENSITIVE Sensitive     ERYTHROMYCIN <=0.12 SENSITIVE Sensitive     VANCOMYCIN 0.5 SENSITIVE Sensitive     CEFTRIAXONE <=0.12 SENSITIVE Sensitive     LEVOFLOXACIN 0.5 SENSITIVE Sensitive     PENICILLIN Value in next row Sensitive      SENSITIVE<=0.06    * STREPTOCOCCUS GROUP C  Blood culture (routine x 2)     Status: Abnormal   Collection Time: 10/25/18  8:38 PM   Specimen: BLOOD  Result Value Ref Range Status   Specimen Description   Final    BLOOD RIGHT ANTECUBITAL Performed at [facility], [address] [place], [place] [address]    Special Requests   Final    BOTTLES DRAWN AEROBIC AND ANAEROBIC Blood Culture results may not be optimal due to an excessive volume of blood received in culture bottles Performed at [facility], [address] [place], [place] [address]    Culture  Setup Time   Final    GRAM POSITIVE COCCI IN  BOTH AEROBIC AND ANAEROBIC BOTTLES CRITICAL VALUE NOTED.  VALUE IS CONSISTENT WITH PREVIOUSLY REPORTED AND CALLED VALUE. Performed at [facility], [address] [place], [place] [address]    Culture (A)  Final    STREPTOCOCCUS GROUP C SUSCEPTIBILITIES PERFORMED ON PREVIOUS CULTURE WITHIN THE LAST 5 DAYS. Performed at [facility], [place] [address], [place], [place] [address]    Report Status 10/28/2018 FINAL  Final  Blood Culture ID Panel (Reflexed)     Status: Abnormal   Collection Time: 10/25/18  8:38 PM  Result Value Ref Range Status   Enterococcus species NOT DETECTED NOT DETECTED Final   Listeria monocytogenes NOT DETECTED NOT DETECTED Final   Staphylococcus species NOT DETECTED NOT DETECTED Final   Staphylococcus aureus (BCID) NOT DETECTED NOT DETECTED Final   Streptococcus species DETECTED (A) NOT DETECTED  Final    Comment: Not Enterococcus species, Streptococcus agalactiae, Streptococcus pyogenes, or Streptococcus pneumoniae. CRITICAL RESULT CALLED TO, READ BACK BY AND VERIFIED WITH: CHARLES SHANLEVER @0835  ON 10/26/2018 BY FMW    Streptococcus agalactiae NOT DETECTED NOT DETECTED Final   Streptococcus pneumoniae NOT DETECTED NOT DETECTED Final   Streptococcus pyogenes NOT DETECTED NOT DETECTED Final   Acinetobacter baumannii NOT DETECTED NOT DETECTED Final   Enterobacteriaceae species NOT DETECTED NOT DETECTED Final   Enterobacter cloacae complex NOT DETECTED NOT DETECTED Final   Escherichia coli NOT DETECTED NOT DETECTED Final   Klebsiella oxytoca NOT DETECTED NOT DETECTED Final   Klebsiella pneumoniae NOT DETECTED NOT DETECTED Final   Proteus species NOT DETECTED NOT DETECTED Final   Serratia marcescens NOT DETECTED NOT DETECTED Final   Haemophilus influenzae NOT DETECTED NOT DETECTED Final   Neisseria meningitidis NOT DETECTED NOT DETECTED Final   Pseudomonas aeruginosa NOT DETECTED NOT DETECTED Final   Candida albicans NOT DETECTED NOT DETECTED Final   Candida glabrata NOT DETECTED NOT DETECTED Final   Candida krusei NOT DETECTED NOT DETECTED Final   Candida parapsilosis NOT DETECTED NOT DETECTED Final   Candida tropicalis NOT DETECTED NOT DETECTED Final    Comment: Performed at [facility], [address] [place], [place] [address]  SARS Coronavirus 2 by RT PCR (hospital order, performed in [place] hospital lab) Nasopharyngeal Nasopharyngeal Swab     Status: None   Collection Time: 10/25/18  8:49 PM   Specimen: Nasopharyngeal Swab  Result Value Ref Range Status   SARS Coronavirus 2 NEGATIVE NEGATIVE Final    Comment: (NOTE) If result is NEGATIVE SARS-CoV-2 target nucleic acids are NOT DETECTED. The SARS-CoV-2 RNA is generally detectable in upper and lower  respiratory specimens during the acute phase of infection. The lowest  concentration of SARS-CoV-2 viral  copies this assay can detect is 250  copies / mL. A negative result does not preclude SARS-CoV-2 infection  and should not be used as the sole basis for treatment or other  patient management decisions.  A negative result may occur with  improper specimen collection / handling, submission of specimen other  than nasopharyngeal swab, presence of viral mutation(s) within the  areas targeted by this assay, and inadequate number of viral copies  (<250 copies / mL). A negative result must be combined with clinical  observations, patient history, and epidemiological information. If result is POSITIVE SARS-CoV-2 target nucleic acids are DETECTED. The SARS-CoV-2 RNA is generally detectable in upper and lower  respiratory specimens dur ing the acute phase of infection.  Positive  results are indicative of active infection with SARS-CoV-2.  Clinical  correlation with patient history and other diagnostic information is  necessary to determine patient infection status.  Positive results do  not rule out bacterial infection or co-infection with other viruses. If result is PRESUMPTIVE POSTIVE SARS-CoV-2 nucleic acids MAY BE PRESENT.   A presumptive positive result was obtained on the submitted specimen  and confirmed on repeat testing.  While 2019 novel coronavirus  (SARS-CoV-2) nucleic acids may be present in the submitted sample  additional confirmatory testing may be necessary for epidemiological  and / or clinical management purposes  to differentiate between  SARS-CoV-2 and other Sarbecovirus currently known to infect humans.  If clinically indicated additional testing with an alternate test  methodology [phone]) is advised. The SARS-CoV-2 RNA is generally  detectable in upper and lower respiratory sp ecimens during the acute  phase of infection. The expected result is Negative. Fact Sheet for Patients:  StrictlyIdeas.no Fact Sheet for Healthcare Providers:  BankingDealers.co.za This test is not yet approved or cleared by the [place] FDA and has been authorized for detection and/or diagnosis of SARS-CoV-2 by FDA under an Emergency Use Authorization (EUA).  This EUA will remain in effect (meaning this test can be used) for the duration of the COVID-19 declaration under Section 564(b)(1) of the Act, 21 U.S.C. section 360bbb-3(b)(1), unless the authorization is terminated or revoked sooner. Performed at [facility], [address] [place], [place] [address]   MRSA PCR Screening     Status: Abnormal   Collection Time: 10/26/18 11:09 AM   Specimen: Nasopharyngeal  Result Value Ref Range Status   MRSA by PCR POSITIVE (A) NEGATIVE Final    Comment:        The GeneXpert MRSA Assay (FDA approved for NASAL specimens only), is one component of a comprehensive MRSA colonization surveillance program. It is not intended to diagnose MRSA infection nor to guide or monitor treatment for MRSA infections. RESULT CALLED TO, READ BACK BY AND VERIFIED WITH: EMILY GANNON AT 1630 10/26/2018.PMF Performed at [facility], [address] [place], [place] [address]   CULTURE, BLOOD (ROUTINE X 2) w Reflex to ID Panel     Status: None (Preliminary result)   Collection Time: 10/28/18 12:48 AM   Specimen: BLOOD  Result Value Ref Range Status   Specimen Description BLOOD RIGHT ANTECUBITAL  Final   Special Requests   Final    BOTTLES DRAWN AEROBIC AND ANAEROBIC Blood Culture adequate volume   Culture   Final    NO GROWTH 2 DAYS Performed at [facility], [address] [place],  [address]    Report Status PENDING  Incomplete  CULTURE, BLOOD (ROUTINE X 2) w Reflex to ID Panel     Status: None (Preliminary result)   Collection Time: 10/28/18  5:06 AM   Specimen: BLOOD  Result Value Ref Range Status   Specimen Description BLOOD LEFT WRIST  Final   Special Requests   Final    BOTTLES DRAWN  AEROBIC AND ANAEROBIC Blood Culture adequate volume   Culture   Final    NO GROWTH 2 DAYS Performed at [facility], [address] [place],  [address]    Report Status PENDING  Incomplete   Studies/Results: Dg Abd 1 View  Result Date: 10/30/2018 CLINICAL DATA:  Pulmonary disease.  Possible ileus. EXAM: ABDOMEN - 1 VIEW COMPARISON:  CT 10/26/2018. FINDINGS: Prior median sternotomy, CABG, and cardiac valve replacement. Surgical clips right upper quadrant. Air-filled loops of small and large bowel  noted. These are nondilated. Follow-up exam suggested to exclude developing bowel distention. No free air. Metallic density of unknown etiology noted over the left abdomen. Degenerative changes thoracolumbar spine and both hips. IMPRESSION: Air-filled loops of small large bowel noted. These are nondilated. Follow-up exam suggest to exclude developing bowel distention. Electronically Signed   By: Marcello Moores  Register   On: 10/30/2018 09:57   Dg Chest Port 1 View  Result Date: 10/30/2018 CLINICAL DATA:  Pulmonary disease.  Possible ileus. EXAM: PORTABLE CHEST 1 VIEW COMPARISON:  CT 10/26/2018.  Chest x-ray 10/25/2018. FINDINGS: Prior median sternotomy, CABG, and cardiac valve replacement. Stable cardiomegaly. Low lung volumes. Stay chronic bilateral mild interstitial prominence. No focal alveolar infiltrate. No pleural effusion or pneumothorax. IMPRESSION: 1. Prior median sternotomy, CABG, and cardiac valve replacement. Stable cardiomegaly. 2. Low lung volumes with stable chronic bilateral mild interstitial prominence. No acute alveolar infiltrate noted. Electronically Signed   By: [place]   On: 10/30/2018 09:55   Medications:  Scheduled Meds: . sodium chloride   Intravenous Once  . atorvastatin  40 mg Oral Daily  . Chlorhexidine Gluconate Cloth  6 each Topical Q0600  . fluticasone  2 spray Each Nare Daily  . folic acid  1 mg Oral Daily  . furosemide  40 mg Intravenous BID  .  insulin aspart  0-5 Units Subcutaneous QHS  . insulin aspart  0-9 Units Subcutaneous TID WC  . insulin glargine  10 Units Subcutaneous QHS  . magnesium citrate  1 Bottle Oral Once  . midodrine  5 mg Oral TID WC  . mometasone-formoterol  2 puff Inhalation BID  . mupirocin ointment  1 application Nasal BID  . pantoprazole  40 mg Intravenous Q12H  . pregabalin  75 mg Oral BID   Continuous Infusions: . sodium chloride 5 mL/hr at 10/30/18 0100  . ampicillin (OMNIPEN) IV 2 g (10/30/18 1259)   PRN Meds:.sodium chloride, acetaminophen, albuterol   Assessment: Active Problems:   Sepsis ([place])   Pressure injury of skin    Plan: Hemoglobin stable No signs of active GI bleeding Given ongoing treatment for AKI and hyperkalemia, would recommend push enteroscopy when medically optimized, for treatment of small bowel AVM, nonbleeding, seen during small bowel capsule study.  And evaluation for area of gastric erythema versus erosion seen on capsule study.    This can be done as an inpatient or outpatient depending on medical condition  PPI IV twice daily  Continue serial CBCs and transfuse PRN Avoid NSAIDs Maintain 2 large-bore IV lines Please page GI with any acute hemodynamic changes, or signs of active GI bleeding    LOS: 4 days   Tanya Antigua, MD 10/30/2018, 4:12 PM

## 2018-10-30 NOTE — Progress Notes (Signed)
Pt's husband, Jamerica Snavely, at [phone].  He did not answer the phone.  Left message that the patient is moving to Room 119.

## 2018-10-30 NOTE — Progress Notes (Signed)
OT Cancellation Note  Patient Details Name: [name] MRN: [id number] DOB: 1955/07/22   Cancelled Treatment:    Reason Eval/Treat Not Completed: Medical issues which prohibited therapy. Consult received, chart reviewed. Pt's K+ noted to be 5.6 this morning and no additional orders to re-check this date. Pt contraindicated for participation in therapy. Will hold OT at this time and re-attempt OT evaluation at a later date/time as medically appropriate.  Jeni Salles, MPH, MS, OTR/L ascom [phone] 10/30/18, 1:36 PM

## 2018-10-30 NOTE — Procedures (Signed)
Midline Venous Catheter Placement: Indication: Patient receiving vesicant or irritant drug.; Patient receiving intravenous therapy for longer than 5 days.; Patient has limited or no vascular access.   Consent:signed by patient    Hand washing performed prior to starting the procedure.   Procedure:   An active timeout was performed and correct patient, name, & ID confirmed.   Patient was positioned correctly for central venous access.  Patient was prepped using strict sterile technique including chlorohexadine preps, sterile drape, sterile gown and sterile gloves.    The area was prepped, draped and anesthetized in the usual sterile manner. Patient comfort was obtained.    A 20G 12cm single lumen catheter was placed in left brachiocephalic vein. There was good blood return, catheter caps were placed on lumens, catheter flushed easily, the line was secured and a sterile dressing and BIO-PATCH applied.   Ultrasound was used to visualize vasculature and guidance of needle.   Number of Attempts: 1 Complications:none Estimated Blood Loss: none       Ottie Glazier, M.D.  [facility] [place]

## 2018-10-30 NOTE — Progress Notes (Signed)
Patient alert but lethargic/weakness. Tolerating room air. Suppository given-bowel movement this afternoon. Pure wick intact with instances of incontinence. Left upper arm picc line placed this afternoon by Dr. Loni Muse. And assistant. Continue to monitor.

## 2018-10-30 NOTE — Progress Notes (Signed)
Dewaine Conger, NP at bedside to answer patient questions.

## 2018-10-30 NOTE — Progress Notes (Signed)
[facility] [place] NAME: [name]    MR#:  [id number]  DATE OF BIRTH:  Jul 29, 1955  SUBJECTIVE:  No new complaints, feels some better although very tired REVIEW OF SYSTEMS:  CONSTITUTIONAL: No fever, reports fatigue or weakness.  EYES: No blurred or double vision.  EARS, NOSE, AND THROAT: No tinnitus or ear pain.  RESPIRATORY: No cough, shortness of breath, wheezing or hemoptysis.  CARDIOVASCULAR: No chest pain, orthopnea, edema.  GASTROINTESTINAL: No nausea, vomiting, diarrhea or abdominal pain.  GENITOURINARY: No dysuria, hematuria.  ENDOCRINE: No polyuria, nocturia,  HEMATOLOGY: No anemia, easy bruising or bleeding SKIN: No rash or lesion. MUSCULOSKELETAL: No joint pain or arthritis.   NEUROLOGIC: No tingling, numbness, weakness.  PSYCHIATRY: No anxiety or depression.  DRUG ALLERGIES:   Allergies  Allergen Reactions  . Latex Anaphylaxis and Rash  . Gabapentin Nausea And Vomiting and Other (See Comments)    VITALS:  Blood pressure (!) 164/82, pulse 89, temperature 97.6 F (36.4 C), resp. rate (!) 21, height 5' 8"  (1.727 m), weight 126.8 kg, SpO2 100 %. PHYSICAL EXAMINATION:  GENERAL:  63 y.o.-year-old patient lying in the bed with no acute distress.  EYES: Pupils equal, round, reactive to light and accommodation. No scleral icterus. Extraocular muscles intact.  HEENT: Head atraumatic, normocephalic. Oropharynx and nasopharynx clear.  NECK:  Supple, no jugular venous distention. No thyroid enlargement, no tenderness.  LUNGS: Normal breath sounds bilaterally, no wheezing, rales,rhonchi or crepitation. No use of accessory muscles of respiration.  CARDIOVASCULAR: S1, S2 normal. No murmurs, rubs, or gallops.  ABDOMEN: Soft, nontender, nondistended. Bowel sounds present.  EXTREMITIES: No pedal edema, cyanosis, or clubbing.  NEUROLOGIC: Patient is arousable oriented x3 sensation intact. Gait not checked.  PSYCHIATRIC:  The patient is alert and oriented x3.  SKIN: No obvious rash, lesion, or ulcer.  LABORATORY PANEL:   CBC Recent Labs  Lab 10/30/18 0447  WBC 9.5  HGB 8.0*  HCT 26.7*  PLT 151   ------------------------------------------------------------------------------------------------------------------  Chemistries  Recent Labs  Lab 10/27/18 0350  10/30/18 0447  NA 135   < > 134*  K 4.8   < > 5.6*  CL 109   < > 107  CO2 19*   < > 18*  GLUCOSE 98   < > 115*  BUN 33*   < > 41*  CREATININE 2.95*   < > 3.11*  CALCIUM 7.9*   < > 7.8*  AST 17  --   --   ALT 9  --   --   ALKPHOS 74  --   --   BILITOT 2.5*  --   --    < > = values in this interval not displayed.   ------------------------------------------------------------------------------------------------------------------  Cardiac Enzymes No results for input(s): TROPONINI in the last 168 hours. ------------------------------------------------------------------------------------------------------------------  RADIOLOGY:  Dg Abd 1 View  Result Date: 10/30/2018 CLINICAL DATA:  Pulmonary disease.  Possible ileus. EXAM: ABDOMEN - 1 VIEW COMPARISON:  CT 10/26/2018. FINDINGS: Prior median sternotomy, CABG, and cardiac valve replacement. Surgical clips right upper quadrant. Air-filled loops of small and large bowel noted. These are nondilated. Follow-up exam suggested to exclude developing bowel distention. No free air. Metallic density of unknown etiology noted over the left abdomen. Degenerative changes thoracolumbar spine and both hips. IMPRESSION: Air-filled loops of small large bowel noted. These are nondilated. Follow-up exam suggest to exclude developing bowel distention. Electronically Signed   By: Marcello Moores  Register   On: 10/30/2018 09:57  Dg Chest Port 1 View  Result Date: 10/30/2018 CLINICAL DATA:  Pulmonary disease.  Possible ileus. EXAM: PORTABLE CHEST 1 VIEW COMPARISON:  CT 10/26/2018.  Chest x-ray 10/25/2018. FINDINGS: Prior  median sternotomy, CABG, and cardiac valve replacement. Stable cardiomegaly. Low lung volumes. Stay chronic bilateral mild interstitial prominence. No focal alveolar infiltrate. No pleural effusion or pneumothorax. IMPRESSION: 1. Prior median sternotomy, CABG, and cardiac valve replacement. Stable cardiomegaly. 2. Low lung volumes with stable chronic bilateral mild interstitial prominence. No acute alveolar infiltrate noted. Electronically Signed   By: Marcello Moores  Register   On: 10/30/2018 09:55    EKG:   Orders placed or performed during the hospital encounter of 10/25/18  . EKG 12-Lead  . EKG 12-Lead  . EKG 12-Lead  . EKG 12-Lead  . EKG 12-Lead  . EKG 12-Lead  . EKG 12-Lead  . EKG 12-Lead    ASSESSMENT AND PLAN:  63 y.o. 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 Delana Meyer, and CKD to the ED with worsening shortness of breath and fall.  1. Sepsis with multiorgan failure -sepsis present on admission - chest x-ray shows no active cardiopulmonary disease - Covid negative - CT abdomen and pelvis concerning for acute pyelonephritis -right more than the left perinephric stranding - Blood cultures strep group C in 4 out of 4 bottles - continue ampicillin for 2 weeks per ID - TEE neg for any valve vegetations  2.  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 Non bleeding AVM seen in duodenum - Onco seen - unlikely hemolysis per Dr  Mike Gip  3. Elevated troponin -likely demand ischemia - Continue to trend troponin  4. DM: sugars have been well-controlled on current regimen  - hemoglobin A1c 1 6/5 was 5.9 - sliding scale insulin coverage in addition to Lantus, cut back on dose of lantus as sugars running low - ADA 2100 calorie diet    5. Chronic congestive heart failure with reduced EF - stable Echocardiogram with moderate LVH and EF at 40 to45% and diffuse hypokinesis noted. Patient has had bioprosthesis for aortic valve - lasix 40 mg IV bid - Low salt diet   6. COPD -noevidence of exacerbation.  - Supplemental O2, goal sat 88-92% - Duonebs Q 6 hours - Continue home inhalers  7. Acute on chronic Kidney Disease 3 - worsening Creat 3.54-> 3.1 - Hold nephrotoxins, Nephrology following - Continue to monitor renal function - reduced Lyrica dose  8.  Anasarca - on Lasix 40 mg IV bid now, neg 400 cc  9.weakness-PT eval  10. Hyperkalemia: treated with reversal agent and K 5.6 Nephro managing this       All the records are reviewed and case discussed with Care Management/Social Worker. Management plans discussed with the patient, nursing and they are in agreement.  CODE STATUS: Full code  TOTAL TIME TAKING CARE OF THIS PATIENT: 15  minutes.   POSSIBLE D/C IN 2-3 DAYS, DEPENDING ON CLINICAL CONDITION.  Note: This dictation was prepared with Dragon dictation along with  smaller phrase technology. Any transcriptional errors that result from this process are unintentional.   Max Sane M.D on 10/30/2018 at 5:34 PM  Between 7am to 6pm - Pager - [phone] After 6pm go to [email or web address] - password [email or web address] [facility]  Office  [phone]  CC: Primary care physician; [place]

## 2018-10-30 NOTE — Progress Notes (Signed)
PT Cancellation Note  Patient Details Name: [name] MRN: [id number] DOB: October 12, 1955   Cancelled Treatment:    Reason Eval/Treat Not Completed: Medical issues which prohibited therapy.  New PT consult received.  Chart reviewed.  Pt's most current potassium noted to be 5.6 this morning; (physical therapy contra-indicated d/t elevated potassium).  Will hold PT at this time and re-attempt PT evaluation at a later date/time as medically appropriate.  Leitha Bleak, PT 10/30/18, 1:06 PM [phone]

## 2018-10-30 NOTE — Progress Notes (Signed)
[facility], [place] 10/30/18  Subjective:  Patient moved over to the intensive care unit yesterday. Urine output over the preceding 24 hours was 1.4 L. Renal function slightly better today with a creatinine of 3.1. Potassium also down to 5.6. Metabolic acidosis persists with a serum bicarbonate of 18.    Objective:  Vital signs in last 24 hours:  Temp:  [97.4 F (36.3 C)-97.8 F (36.6 C)] 97.6 F (36.4 C) (10/13 0700) Pulse Rate:  [80-95] 90 (10/13 1100) Resp:  [11-33] 20 (10/13 1100) BP: (114-163)/(54-97) 147/54 (10/13 1100) SpO2:  [93 %-100 %] 100 % (10/13 1100) Weight:  [126.8 kg] 126.8 kg (10/12 1532)  Weight change:  Filed Weights   10/25/18 2200 10/25/18 2215 10/29/18 1532  Weight: 121.5 kg 125.1 kg 126.8 kg    Intake/Output:    Intake/Output Summary (Last 24 hours) at 10/30/2018 1141 Last data filed at 10/30/2018 0500 Gross per 24 hour  Intake 880.16 ml  Output 1450 ml  Net -569.84 ml     Physical Exam: General:  Obese female, laying in the bed, no acute distress  HEENT  moist oral mucous membranes  Neck  supple, no masses  Pulm/lungs  normal breathing effort on room air, clear to auscultation  CVS/Heart  regular rhythm, soft systolic murmur  Abdomen:   Soft, nontender  Extremities:  3+ pitting edema over thighs and lower abdomen  Neurologic:  Alert, oriented  Skin:  No acute rashes    Basic Metabolic Panel:  Recent Labs  Lab 10/26/18 0500 10/27/18 0350 10/28/18 0048 10/29/18 0410 10/29/18 0904 10/30/18 0447  NA 137 135 134* 134*  --  134*  K 4.7 4.8 5.0 6.3* 5.1 5.6*  CL 110 109 108 109  --  107  CO2 16* 19* 19* 17*  --  18*  GLUCOSE 183* 98 105* 136*  --  115*  BUN 26* 33* 39* 43*  --  41*  CREATININE 2.27* 2.95* 3.41* 3.54*  --  3.11*  CALCIUM 7.8* 7.9* 7.9* 7.8*  --  7.8*     CBC: Recent Labs  Lab 10/25/18 2038 10/26/18 0500 10/27/18 0350 10/28/18 0048 10/30/18 0447  WBC 14.0* 15.1* 17.9* 17.8*  9.5  NEUTROABS 12.7*  --  15.7*  --   --   HGB 5.6* 5.4* 6.6* 8.1* 8.0*  HCT 20.4* 20.0* 22.1* 27.2* 26.7*  MCV 78.5* 79.1* 77.8* 81.4 79.0*  PLT 182 156 160 193 151      Lab Results  Component Value Date   HEPBSAG NON REACTIVE 10/27/2018   HEPBIGM NON REACTIVE 10/27/2018      Microbiology:  Recent Results (from the past 240 hour(s))  Blood culture (routine x 2)     Status: Abnormal   Collection Time: 10/25/18  8:38 PM   Specimen: BLOOD  Result Value Ref Range Status   Specimen Description   Final    BLOOD LEFT ANTECUBITAL Performed at [facility], [address] [place], [place] [address]    Special Requests   Final    BOTTLES DRAWN AEROBIC AND ANAEROBIC Blood Culture results may not be optimal due to an excessive volume of blood received in culture bottles Performed at [facility], [address] [place], [place] [address]    Culture  Setup Time   Final    GRAM POSITIVE COCCI IN BOTH AEROBIC AND ANAEROBIC BOTTLES CRITICAL RESULT CALLED TO, READ BACK BY AND VERIFIED WITH: CHARLES SHANLEVER @0835  ON 10/26/2018 BY FMW Performed at  [facility], [place] [address] [place], [place] [address]    Culture STREPTOCOCCUS GROUP C (A)  Final   Report Status 10/28/2018 FINAL  Final   Organism ID, Bacteria STREPTOCOCCUS GROUP C  Final      Susceptibility   Streptococcus group c - MIC*    CLINDAMYCIN <=0.25 SENSITIVE Sensitive     AMPICILLIN <=0.25 SENSITIVE Sensitive     ERYTHROMYCIN <=0.12 SENSITIVE Sensitive     VANCOMYCIN 0.5 SENSITIVE Sensitive     CEFTRIAXONE <=0.12 SENSITIVE Sensitive     LEVOFLOXACIN 0.5 SENSITIVE Sensitive     PENICILLIN Value in next row Sensitive      SENSITIVE<=0.06    * STREPTOCOCCUS GROUP C  Blood culture (routine x 2)     Status: Abnormal   Collection Time: 10/25/18  8:38 PM   Specimen: BLOOD  Result Value Ref Range Status   Specimen Description   Final    BLOOD RIGHT ANTECUBITAL Performed at [facility], [address] [place], [place] [address]    Special Requests   Final    BOTTLES DRAWN AEROBIC AND ANAEROBIC Blood Culture results may not be optimal due to an excessive volume of blood received in culture bottles Performed at [facility], [address] [place], [place] [address]    Culture  Setup Time   Final    GRAM POSITIVE COCCI IN BOTH AEROBIC AND ANAEROBIC BOTTLES CRITICAL VALUE NOTED.  VALUE IS CONSISTENT WITH PREVIOUSLY REPORTED AND CALLED VALUE. Performed at [facility], [address] [place], [place] [address]    Culture (A)  Final    STREPTOCOCCUS GROUP C SUSCEPTIBILITIES PERFORMED ON PREVIOUS CULTURE WITHIN THE LAST 5 DAYS. Performed at [facility], [place] [address], [place], [place] [address]    Report Status 10/28/2018 FINAL  Final  Blood Culture ID Panel (Reflexed)     Status: Abnormal   Collection Time: 10/25/18  8:38 PM  Result Value Ref Range Status   Enterococcus species NOT DETECTED NOT DETECTED Final   Listeria monocytogenes NOT DETECTED NOT DETECTED Final   Staphylococcus species NOT DETECTED NOT DETECTED Final   Staphylococcus aureus (BCID) NOT DETECTED NOT DETECTED Final   Streptococcus species DETECTED (A) NOT DETECTED Final    Comment: Not Enterococcus species, Streptococcus agalactiae, Streptococcus pyogenes, or Streptococcus pneumoniae. CRITICAL RESULT CALLED TO, READ BACK BY AND VERIFIED WITH: CHARLES SHANLEVER @0835  ON 10/26/2018 BY FMW    Streptococcus agalactiae NOT DETECTED NOT DETECTED Final   Streptococcus pneumoniae NOT DETECTED NOT DETECTED Final   Streptococcus pyogenes NOT DETECTED NOT DETECTED Final   Acinetobacter baumannii NOT DETECTED NOT DETECTED Final   Enterobacteriaceae species NOT DETECTED NOT DETECTED Final   Enterobacter cloacae complex NOT DETECTED NOT DETECTED Final   Escherichia coli NOT DETECTED NOT DETECTED Final   Klebsiella oxytoca NOT DETECTED NOT DETECTED Final   Klebsiella  pneumoniae NOT DETECTED NOT DETECTED Final   Proteus species NOT DETECTED NOT DETECTED Final   Serratia marcescens NOT DETECTED NOT DETECTED Final   Haemophilus influenzae NOT DETECTED NOT DETECTED Final   Neisseria meningitidis NOT DETECTED NOT DETECTED Final   Pseudomonas aeruginosa NOT DETECTED NOT DETECTED Final   Candida albicans NOT DETECTED NOT DETECTED Final   Candida glabrata NOT DETECTED NOT DETECTED Final   Candida krusei NOT DETECTED NOT DETECTED Final   Candida parapsilosis NOT DETECTED NOT DETECTED Final   Candida tropicalis NOT DETECTED NOT DETECTED Final    Comment: Performed at [facility], [address] [place], [place] [address]  SARS Coronavirus 2 by RT PCR (hospital order, performed in [facility] hospital lab) Nasopharyngeal Nasopharyngeal Swab     Status: None   Collection Time: 10/25/18  8:49 PM   Specimen: Nasopharyngeal Swab  Result Value Ref Range Status   SARS Coronavirus 2 NEGATIVE NEGATIVE Final    Comment: (NOTE) If result is NEGATIVE SARS-CoV-2 target nucleic acids are NOT DETECTED. The SARS-CoV-2 RNA is generally detectable in upper and lower  respiratory specimens during the acute phase of infection. The lowest  concentration of SARS-CoV-2 viral copies this assay can detect is 250  copies / mL. A negative result does not preclude SARS-CoV-2 infection  and should not be used as the sole basis for treatment or other  patient management decisions.  A negative result may occur with  improper specimen collection / handling, submission of specimen other  than nasopharyngeal swab, presence of viral mutation(s) within the  areas targeted by this assay, and inadequate number of viral copies  (<250 copies / mL). A negative result must be combined with clinical  observations, patient history, and epidemiological information. If result is POSITIVE SARS-CoV-2 target nucleic acids are DETECTED. The SARS-CoV-2 RNA is generally detectable in upper and  lower  respiratory specimens dur ing the acute phase of infection.  Positive  results are indicative of active infection with SARS-CoV-2.  Clinical  correlation with patient history and other diagnostic information is  necessary to determine patient infection status.  Positive results do  not rule out bacterial infection or co-infection with other viruses. If result is PRESUMPTIVE POSTIVE SARS-CoV-2 nucleic acids MAY BE PRESENT.   A presumptive positive result was obtained on the submitted specimen  and confirmed on repeat testing.  While 2019 novel coronavirus  (SARS-CoV-2) nucleic acids may be present in the submitted sample  additional confirmatory testing may be necessary for epidemiological  and / or clinical management purposes  to differentiate between  SARS-CoV-2 and other Sarbecovirus currently known to infect humans.  If clinically indicated additional testing with an alternate test  methodology [phone]) is advised. The SARS-CoV-2 RNA is generally  detectable in upper and lower respiratory sp ecimens during the acute  phase of infection. The expected result is Negative. Fact Sheet for Patients:  StrictlyIdeas.no Fact Sheet for Healthcare Providers: BankingDealers.co.za This test is not yet approved or cleared by the [place] FDA and has been authorized for detection and/or diagnosis of SARS-CoV-2 by FDA under an Emergency Use Authorization (EUA).  This EUA will remain in effect (meaning this test can be used) for the duration of the COVID-19 declaration under Section 564(b)(1) of the Act, 21 U.S.C. section 360bbb-3(b)(1), unless the authorization is terminated or revoked sooner. Performed at [facility], [address] [place], [place] [address]   MRSA PCR Screening     Status: Abnormal   Collection Time: 10/26/18 11:09 AM   Specimen: Nasopharyngeal  Result Value Ref Range Status   MRSA by PCR POSITIVE (A)  NEGATIVE Final    Comment:        The GeneXpert MRSA Assay (FDA approved for NASAL specimens only), is one component of a comprehensive MRSA colonization surveillance program. It is not intended to diagnose MRSA infection nor to guide or monitor treatment for MRSA infections. RESULT CALLED TO, READ BACK BY AND VERIFIED WITH: EMILY GANNON AT 1630 10/26/2018.PMF Performed at [facility], [address], [place], [place] [address]   CULTURE, BLOOD (ROUTINE  X 2) w Reflex to ID Panel     Status: None (Preliminary result)   Collection Time: 10/28/18 12:48 AM   Specimen: BLOOD  Result Value Ref Range Status   Specimen Description BLOOD RIGHT ANTECUBITAL  Final   Special Requests   Final    BOTTLES DRAWN AEROBIC AND ANAEROBIC Blood Culture adequate volume   Culture   Final    NO GROWTH 2 DAYS Performed at [facility], [address], [place], [place] [address]    Report Status PENDING  Incomplete  CULTURE, BLOOD (ROUTINE X 2) w Reflex to ID Panel     Status: None (Preliminary result)   Collection Time: 10/28/18  5:06 AM   Specimen: BLOOD  Result Value Ref Range Status   Specimen Description BLOOD LEFT WRIST  Final   Special Requests   Final    BOTTLES DRAWN AEROBIC AND ANAEROBIC Blood Culture adequate volume   Culture   Final    NO GROWTH 2 DAYS Performed at [facility], [address], [place], [place] [address]    Report Status PENDING  Incomplete    Coagulation Studies: No results for input(s): LABPROT, INR in the last 72 hours.  Urinalysis: Recent Labs    10/28/18 1208  COLORURINE STRAW*  LABSPEC 1.005  PHURINE 5.0  GLUCOSEU NEGATIVE  HGBUR SMALL*  BILIRUBINUR NEGATIVE  KETONESUR NEGATIVE  PROTEINUR NEGATIVE  NITRITE NEGATIVE  LEUKOCYTESUR NEGATIVE      Imaging: Dg Abd 1 View  Result Date: 10/30/2018 CLINICAL DATA:  Pulmonary disease.  Possible ileus. EXAM: ABDOMEN - 1 VIEW COMPARISON:  CT 10/26/2018. FINDINGS: Prior median  sternotomy, CABG, and cardiac valve replacement. Surgical clips right upper quadrant. Air-filled loops of small and large bowel noted. These are nondilated. Follow-up exam suggested to exclude developing bowel distention. No free air. Metallic density of unknown etiology noted over the left abdomen. Degenerative changes thoracolumbar spine and both hips. IMPRESSION: Air-filled loops of small large bowel noted. These are nondilated. Follow-up exam suggest to exclude developing bowel distention. Electronically Signed   By: Marcello Moores  Register   On: 10/30/2018 09:57   Dg Chest Port 1 View  Result Date: 10/30/2018 CLINICAL DATA:  Pulmonary disease.  Possible ileus. EXAM: PORTABLE CHEST 1 VIEW COMPARISON:  CT 10/26/2018.  Chest x-ray 10/25/2018. FINDINGS: Prior median sternotomy, CABG, and cardiac valve replacement. Stable cardiomegaly. Low lung volumes. Stay chronic bilateral mild interstitial prominence. No focal alveolar infiltrate. No pleural effusion or pneumothorax. IMPRESSION: 1. Prior median sternotomy, CABG, and cardiac valve replacement. Stable cardiomegaly. 2. Low lung volumes with stable chronic bilateral mild interstitial prominence. No acute alveolar infiltrate noted. Electronically Signed   By: Marcello Moores  Register   On: 10/30/2018 09:55     Medications:   . sodium chloride 5 mL/hr at 10/30/18 0100  . ampicillin (OMNIPEN) IV 2 g (10/30/18 0546)   . sodium chloride   Intravenous Once  . atorvastatin  40 mg Oral Daily  . Chlorhexidine Gluconate Cloth  6 each Topical Q0600  . fluticasone  2 spray Each Nare Daily  . folic acid  1 mg Oral Daily  . furosemide  40 mg Intravenous BID  . insulin aspart  0-5 Units Subcutaneous QHS  . insulin aspart  0-9 Units Subcutaneous TID WC  . insulin glargine  10 Units Subcutaneous QHS  . magnesium citrate  1 Bottle Oral Once  . midodrine  5 mg Oral TID WC  . mometasone-formoterol  2 puff Inhalation BID  . mupirocin ointment  1 application Nasal BID  .  pantoprazole  40 mg Intravenous Q12H  . pregabalin  75 mg Oral BID   sodium chloride, acetaminophen, albuterol  Assessment/ Plan:  63 y.o. African American female with Medical problems of poorly controlled diabetes, hepatic cirrhosis, admission for gastric ulcer in 2018, congestive heart failure, grade 2 diastolic dysfunction, concentric LVH, Chronic low back pain, obstructive sleep apnea, history of CAD/STEMI, hepatitis C 2016  #Acute kidney injury/#Chronic kidney disease stage III Baseline creatinine 1.20/GFR 48 from August 29, 2018 Urinalysis from August 2 shows moderate blood, 0-5 RBCs, 0-5 WBCs.  Negative for protein.  UPEP negative for M spike in 2018 and Jan 2020. SPEP neg for M spike in 01/2018  Renal imaging in the form of CT abdomen pelvis without contrast shows bilateral perinephric stranding, no stone or obstruction. -Renal function has slightly improved.  Creatinine down to 3.1.  Urine output good at 1.4 L over the preceding 24 hours.  Continue to monitor renal parameters.   #Anemia of chronic kidney disease -Hemoglobin currently 8.0.  May need to consider adding Epogen as well.  #Generalized edema 2D echo from August 2020 shows low normal LV EF of 50 to 55%.  Mild concentric LVH and diastolic dysfunction Patient is noted to have hypoalbuminemia -Continue diuresis at the moment.    LOS: 4 [name] 10/13/202011:41 AM  [place], [place]  Note: This note was prepared with Dragon dictation. Any transcription errors are unintentional

## 2018-10-30 NOTE — Consult Note (Signed)
Name: [name] MRN: [id number] DOB: 1955/11/16     CONSULTATION DATE: 10/29/2018  CHIEF COMPLAINT:  Sepsis  SYNOPSIS: [name] is a 63 yo female with past medical history of CHF, anemia, COPD, NSTEMI and diabetes mellitus type 2 originally presented to the ED on 10/8 complaining of SOB and falling down off the toilet and not being able to get up. Pt was diagnosed with sepsis secondary to strep bacteremia complicated by severe electrolyte derrangement.    SIGNIFICANT EVENTS/STUDIES:  10/8 Presented to ED with complaints of shortness of breath and fall at home. Diagnosed with septic shock and admitted to the med-surg unit 10/12 Transferred to ICU with hyperkalemia (6.3), which resolved upon recheck to 5.1    PAST MEDICAL HISTORY :   has a past medical history of Cervical cancer ([place]), CHF (congestive heart failure) ([place]), Chronic anemia, COPD (chronic obstructive pulmonary disease) ([place]), Diabetes mellitus without complication ([place]), Gastric ulcer, NSTEMI (non-ST elevated myocardial infarction) ([place]), and Upper GI bleed.  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 Prior to Admission medications   Medication Sig Start Date End Date Taking? Authorizing Provider  atorvastatin (LIPITOR) 40 MG tablet Take 40 mg by mouth daily.   Yes [provider]  carvedilol (COREG) 3.125 MG tablet Take 1 tablet  (3.125 mg total) by mouth 2 (two) times daily with a meal. 08/21/18  Yes Ojie, Jude, MD  ferrous sulfate 325 (65 FE) MG tablet Take 1 tablet (325 mg total) by mouth 2 (two) times daily with a meal. 08/21/18  Yes Ojie, Jude, MD  fluticasone (FLONASE) 50 MCG/ACT nasal spray Place 2 sprays into both nostrils daily.   Yes [provider]  folic acid (FOLVITE) 1 MG tablet Take 1 tablet (1 mg total) by mouth daily. 08/21/18  Yes Ojie, Jude, MD  furosemide (LASIX) 40 MG tablet Take 1 tablet (40 mg total) by mouth daily. Patient taking differently: Take 40 mg by mouth daily as needed. Take if weight is increased (Per instructions) 01/16/18 01/16/19 Yes Gladstone Lighter, MD  Insulin Glargine (LANTUS SOLOSTAR) 100 UNIT/ML Solostar Pen Inject 15 Units into the skin daily. Patient taking differently: Inject 16 Units into the skin at bedtime.  08/29/17  Yes Dustin Flock, MD  lisinopril (ZESTRIL) 5 MG tablet Take 5 mg by mouth daily.   Yes [provider]  omeprazole (PRILOSEC) 40 MG capsule Take 1 capsule (40 mg total) by mouth 2 (two) times a day. 08/15/18 10/25/18 Yes Vanga, Tally Due, MD  pantoprazole (PROTONIX) 40 MG tablet Take 40 mg by mouth 2 (two) times daily.   Yes [provider]  pregabalin (LYRICA) 150 MG capsule Take 150 mg by mouth 2 (two) times daily. 08/03/18  Yes [provider]  PROAIR HFA 108 (90 Base) MCG/ACT inhaler Inhale 1-2 puffs into the lungs every 4 (four) hours as needed. 10/10/17  Yes [provider]  spironolactone (ALDACTONE) 25 MG tablet Take 12.5 mg by mouth daily.   Yes [provider]  SYMBICORT 160-4.5 MCG/ACT inhaler Inhale 2 puffs into the lungs 2 (two) times daily. 10/10/17  Yes [provider]  torsemide (DEMADEX) 20  MG tablet Take 20 mg by mouth daily. Take 2 tablets if weight is greater than 185 lbs. NO tablets if weight is less than 177 lbs.   Yes [provider]  Multiple Vitamin (MULTIVITAMIN WITH  MINERALS) TABS tablet Take 1 tablet by mouth daily. Patient not taking: Reported on 10/25/2018 06/28/18   Lang Snow, NP  thiamine 100 MG tablet Take 1 tablet (100 mg total) by mouth daily. Patient not taking: Reported on 10/25/2018 06/28/18   Lang Snow, NP   Allergies  Allergen Reactions  . Latex Anaphylaxis and Rash  . Gabapentin Nausea And Vomiting and Other (See Comments)    Review of Systems:  Gen:  Denies  fever, sweats, chills weight loss  HEENT: Denies blurred vision, double vision, ear pain, eye pain, hearing loss, nose bleeds, sore throat Cardiac:  No dizziness, chest pain or heaviness, chest tightness,edema, No JVD Resp:   No cough, -sputum production, -shortness of breath,-wheezing, -hemoptysis,  Gi: Denies swallowing difficulty, stomach pain, nausea or vomiting, diarrhea, constipation, bowel incontinence Gu:  Denies bladder incontinence, burning urine Ext:   Denies Joint pain, stiffness or swelling Skin: Denies  skin rash, easy bruising or bleeding or hives Endoc:  Denies polyuria, polydipsia , polyphagia or weight change Psych:   Denies depression, insomnia or hallucinations  Other:  All other systems negative   VITAL SIGNS: Temp:  [97.4 F (36.3 C)-97.8 F (36.6 C)] 97.6 F (36.4 C) (10/13 0700) Pulse Rate:  [80-90] 90 (10/13 0700) Resp:  [11-33] 21 (10/13 0700) BP: (114-146)/(54-97) 139/68 (10/13 0700) SpO2:  [93 %-100 %] 99 % (10/13 0700) Weight:  [126.8 kg] 126.8 kg (10/12 1532)   I/O last 3 completed shifts: In: 1311.7 [P.O.:720; I.V.:223; IV Piggyback:368.7] Out: 2050 [Urine:2050] No intake/output data recorded.   SpO2: 99 % O2 Flow Rate (L/min): 2 L/min   Physical Examination:  GENERAL:critically ill appearing, no resp distress HEAD: Normocephalic, atraumatic.  EYES: Pupils equal, round, reactive to light.  No scleral icterus.  MOUTH: Moist mucosal membrane. NECK: Supple. No JVD.  PULMONARY: lungs clear to  auscultation CARDIOVASCULAR: S1 and S2. Regular rate and rhythm. No murmurs, rubs, or gallops.  GASTROINTESTINAL: Soft, nontender, -distended. No masses. Positive bowel sounds. No hepatosplenomegaly.  MUSCULOSKELETAL: No swelling, clubbing, or edema.  NEUROLOGIC: A&Ox4, no focal deficits SKIN:intact,warm,dry  I personally reviewed lab work that was obtained in last 24 hrs. CXR Independently reviewed  MEDICATIONS: I have reviewed all medications and confirmed regimen as documented   CULTURE RESULTS   Recent Results (from the past 240 hour(s))  Blood culture (routine x 2)     Status: Abnormal   Collection Time: 10/25/18  8:38 PM   Specimen: BLOOD  Result Value Ref Range Status   Specimen Description   Final    BLOOD LEFT ANTECUBITAL Performed at [facility], [address] [place], [place] [address]    Special Requests   Final    BOTTLES DRAWN AEROBIC AND ANAEROBIC Blood Culture results may not be optimal due to an excessive volume of blood received in culture bottles Performed at [facility], [address] [place], [place] [address]    Culture  Setup Time   Final    GRAM POSITIVE COCCI IN BOTH AEROBIC AND ANAEROBIC BOTTLES CRITICAL RESULT CALLED TO, READ BACK BY AND VERIFIED WITH: CHARLES SHANLEVER @0835  ON 10/26/2018 BY FMW Performed at [facility], [place] [address], [place], [place] [address]    Culture STREPTOCOCCUS GROUP C (A)  Final   Report Status 10/28/2018 FINAL  Final   Organism ID, Bacteria STREPTOCOCCUS GROUP C  Final      Susceptibility   Streptococcus group c - MIC*    CLINDAMYCIN <=0.25 SENSITIVE Sensitive     AMPICILLIN <=0.25 SENSITIVE Sensitive     ERYTHROMYCIN <=0.12 SENSITIVE Sensitive     VANCOMYCIN 0.5 SENSITIVE Sensitive     CEFTRIAXONE <=0.12 SENSITIVE Sensitive     LEVOFLOXACIN 0.5 SENSITIVE Sensitive     PENICILLIN Value in next row Sensitive      SENSITIVE<=0.06    * STREPTOCOCCUS GROUP C  Blood culture (routine x  2)     Status: Abnormal   Collection Time: 10/25/18  8:38 PM   Specimen: BLOOD  Result Value Ref Range Status   Specimen Description   Final    BLOOD RIGHT ANTECUBITAL Performed at [facility], [address] [place], [place] [address]    Special Requests   Final    BOTTLES DRAWN AEROBIC AND ANAEROBIC Blood Culture results may not be optimal due to an excessive volume of blood received in culture bottles Performed at [facility], [address] [place], [place] [address]    Culture  Setup Time   Final    GRAM POSITIVE COCCI IN BOTH AEROBIC AND ANAEROBIC BOTTLES CRITICAL VALUE NOTED.  VALUE IS CONSISTENT WITH PREVIOUSLY REPORTED AND CALLED VALUE. Performed at [facility], [address] [place], [place] [address]    Culture (A)  Final    STREPTOCOCCUS GROUP C SUSCEPTIBILITIES PERFORMED ON PREVIOUS CULTURE WITHIN THE LAST 5 DAYS. Performed at [facility], [place] [address], [place], [place] [address]    Report Status 10/28/2018 FINAL  Final  Blood Culture ID Panel (Reflexed)     Status: Abnormal   Collection Time: 10/25/18  8:38 PM  Result Value Ref Range Status   Enterococcus species NOT DETECTED NOT DETECTED Final   Listeria monocytogenes NOT DETECTED NOT DETECTED Final   Staphylococcus species NOT DETECTED NOT DETECTED Final   Staphylococcus aureus (BCID) NOT DETECTED NOT DETECTED Final   Streptococcus species DETECTED (A) NOT DETECTED Final    Comment: Not Enterococcus species, Streptococcus agalactiae, Streptococcus pyogenes, or Streptococcus pneumoniae. CRITICAL RESULT CALLED TO, READ BACK BY AND VERIFIED WITH: CHARLES SHANLEVER @0835  ON 10/26/2018 BY FMW    Streptococcus agalactiae NOT DETECTED NOT DETECTED Final   Streptococcus pneumoniae NOT DETECTED NOT DETECTED Final   Streptococcus pyogenes NOT DETECTED NOT DETECTED Final   Acinetobacter baumannii NOT DETECTED NOT DETECTED Final   Enterobacteriaceae species NOT DETECTED NOT  DETECTED Final   Enterobacter cloacae complex NOT DETECTED NOT DETECTED Final   Escherichia coli NOT DETECTED NOT DETECTED Final   Klebsiella oxytoca NOT DETECTED NOT DETECTED Final   Klebsiella pneumoniae NOT DETECTED NOT DETECTED Final   Proteus species NOT DETECTED NOT DETECTED Final   Serratia marcescens NOT DETECTED NOT DETECTED Final   Haemophilus influenzae NOT DETECTED NOT DETECTED Final   Neisseria meningitidis NOT DETECTED NOT DETECTED Final   Pseudomonas aeruginosa NOT DETECTED NOT DETECTED Final   Candida albicans NOT DETECTED NOT DETECTED Final   Candida glabrata NOT DETECTED NOT DETECTED Final   Candida krusei NOT DETECTED NOT DETECTED Final   Candida parapsilosis NOT DETECTED NOT DETECTED Final   Candida tropicalis NOT DETECTED NOT DETECTED Final    Comment: Performed at [facility], [address] [place], [place] [address]  SARS Coronavirus 2 by RT PCR (hospital order, performed in  [facility] hospital lab) Nasopharyngeal Nasopharyngeal Swab     Status: None   Collection Time: 10/25/18  8:49 PM   Specimen: Nasopharyngeal Swab  Result Value Ref Range Status   SARS Coronavirus 2 NEGATIVE NEGATIVE Final    Comment: (NOTE) If result is NEGATIVE SARS-CoV-2 target nucleic acids are NOT DETECTED. The SARS-CoV-2 RNA is generally detectable in upper and lower  respiratory specimens during the acute phase of infection. The lowest  concentration of SARS-CoV-2 viral copies this assay can detect is 250  copies / mL. A negative result does not preclude SARS-CoV-2 infection  and should not be used as the sole basis for treatment or other  patient management decisions.  A negative result may occur with  improper specimen collection / handling, submission of specimen other  than nasopharyngeal swab, presence of viral mutation(s) within the  areas targeted by this assay, and inadequate number of viral copies  (<250 copies / mL). A negative result must be combined with  clinical  observations, patient history, and epidemiological information. If result is POSITIVE SARS-CoV-2 target nucleic acids are DETECTED. The SARS-CoV-2 RNA is generally detectable in upper and lower  respiratory specimens dur ing the acute phase of infection.  Positive  results are indicative of active infection with SARS-CoV-2.  Clinical  correlation with patient history and other diagnostic information is  necessary to determine patient infection status.  Positive results do  not rule out bacterial infection or co-infection with other viruses. If result is PRESUMPTIVE POSTIVE SARS-CoV-2 nucleic acids MAY BE PRESENT.   A presumptive positive result was obtained on the submitted specimen  and confirmed on repeat testing.  While 2019 novel coronavirus  (SARS-CoV-2) nucleic acids may be present in the submitted sample  additional confirmatory testing may be necessary for epidemiological  and / or clinical management purposes  to differentiate between  SARS-CoV-2 and other Sarbecovirus currently known to infect humans.  If clinically indicated additional testing with an alternate test  methodology [phone]) is advised. The SARS-CoV-2 RNA is generally  detectable in upper and lower respiratory sp ecimens during the acute  phase of infection. The expected result is Negative. Fact Sheet for Patients:  StrictlyIdeas.no Fact Sheet for Healthcare Providers: BankingDealers.co.za This test is not yet approved or cleared by the [place] FDA and has been authorized for detection and/or diagnosis of SARS-CoV-2 by FDA under an Emergency Use Authorization (EUA).  This EUA will remain in effect (meaning this test can be used) for the duration of the COVID-19 declaration under Section 564(b)(1) of the Act, 21 U.S.C. section 360bbb-3(b)(1), unless the authorization is terminated or revoked sooner. Performed at [facility], [address] [place], [place] [address]   MRSA PCR Screening     Status: Abnormal   Collection Time: 10/26/18 11:09 AM   Specimen: Nasopharyngeal  Result Value Ref Range Status   MRSA by PCR POSITIVE (A) NEGATIVE Final    Comment:        The GeneXpert MRSA Assay (FDA approved for NASAL specimens only), is one component of a comprehensive MRSA colonization surveillance program. It is not intended to diagnose MRSA infection nor to guide or monitor treatment for MRSA infections. RESULT CALLED TO, READ BACK BY AND VERIFIED WITH: EMILY GANNON AT 1630 10/26/2018.PMF Performed at [facility], [place], [place] [address]   CULTURE, BLOOD (ROUTINE X 2) w Reflex to ID Panel     Status: None (Preliminary result)   Collection Time: 10/28/18  12:48 AM   Specimen: BLOOD  Result Value Ref Range Status   Specimen Description BLOOD RIGHT ANTECUBITAL  Final   Special Requests   Final    BOTTLES DRAWN AEROBIC AND ANAEROBIC Blood Culture adequate volume   Culture   Final    NO GROWTH 2 DAYS Performed at [facility], [address] [place], [place] [address]    Report Status PENDING  Incomplete  CULTURE, BLOOD (ROUTINE X 2) w Reflex to ID Panel     Status: None (Preliminary result)   Collection Time: 10/28/18  5:06 AM   Specimen: BLOOD  Result Value Ref Range Status   Specimen Description BLOOD LEFT WRIST  Final   Special Requests   Final    BOTTLES DRAWN AEROBIC AND ANAEROBIC Blood Culture adequate volume   Culture   Final    NO GROWTH 2 DAYS Performed at [facility], [place], [place] [address]    Report Status PENDING  Incomplete   -multidisciplinary rounds conducted today  ASSESSMENT AND PLAN  63 yo female admitted to the ICU with diagnosis of sepsis confirmed with blood cultures +group C streptococcus and +MRSA PCR, with bilateral small pleural effusions indicating possible pneumosepsis.   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  Pulmonary & Critical Care Medicine  [facility]   Critical care provider statement:    Critical care time (minutes):  32   Critical care time was exclusive of:  Separately billable procedures and  treating other patients   Critical care was necessary to treat or prevent imminent or  life-threatening deterioration of the following conditions:   Septic shock due to strep bacteremia, acute blood loss anemia, demand ischemia, diabetes, chronic systolic CHF, multiple comorbid conditions   Critical care was time spent personally by me on the following  activities:  Development of treatment plan with patient or surrogate,  discussions with consultants, evaluation of patient's response to  treatment, examination of patient, obtaining history from patient or  surrogate, ordering and performing treatments and interventions, ordering  and review of laboratory studies and re-evaluation of patient's condition   I assumed direction of critical care for this patient from another  provider in my specialty: no

## 2018-10-31 ENCOUNTER — Inpatient Hospital Stay: Payer: Medicaid Other

## 2018-10-31 DIAGNOSIS — L98419 Non-pressure chronic ulcer of buttock with unspecified severity: Secondary | ICD-10-CM

## 2018-10-31 LAB — MULTIPLE MYELOMA PANEL, SERUM
Albumin SerPl Elph-Mcnc: 2.5 g/dL — ABNORMAL LOW (ref 2.9–4.4)
Albumin/Glob SerPl: 0.6 — ABNORMAL LOW (ref 0.7–1.7)
Alpha 1: 0.4 g/dL (ref 0.0–0.4)
Alpha2 Glob SerPl Elph-Mcnc: 0.7 g/dL (ref 0.4–1.0)
B-Globulin SerPl Elph-Mcnc: 1.1 g/dL (ref 0.7–1.3)
Gamma Glob SerPl Elph-Mcnc: 2.8 g/dL — ABNORMAL HIGH (ref 0.4–1.8)
Globulin, Total: 5 g/dL — ABNORMAL HIGH (ref 2.2–3.9)
IgA: 669 mg/dL — ABNORMAL HIGH (ref 87–352)
IgG (Immunoglobin G), Serum: 2587 mg/dL — ABNORMAL HIGH (ref 586–1602)
IgM (Immunoglobulin M), Srm: 167 mg/dL (ref 26–217)
Total Protein ELP: 7.5 g/dL (ref 6.0–8.5)

## 2018-10-31 LAB — GLUCOSE, CAPILLARY
Glucose-Capillary: 117 mg/dL — ABNORMAL HIGH (ref 70–99)
Glucose-Capillary: 161 mg/dL — ABNORMAL HIGH (ref 70–99)
Glucose-Capillary: 187 mg/dL — ABNORMAL HIGH (ref 70–99)
Glucose-Capillary: 139 mg/dL — ABNORMAL HIGH (ref 70–99)

## 2018-10-31 LAB — CBC
HCT: 26.6 % — ABNORMAL LOW (ref 36.0–46.0)
Hemoglobin: 7.7 g/dL — ABNORMAL LOW (ref 12.0–15.0)
MCH: 23.7 pg — ABNORMAL LOW (ref 26.0–34.0)
MCHC: 28.9 g/dL — ABNORMAL LOW (ref 30.0–36.0)
MCV: 81.8 fL (ref 80.0–100.0)
Platelets: 179 10*3/uL (ref 150–400)
RBC: 3.25 MIL/uL — ABNORMAL LOW (ref 3.87–5.11)
RDW: 23.3 % — ABNORMAL HIGH (ref 11.5–15.5)
WBC: 7.3 10*3/uL (ref 4.0–10.5)
nRBC: 0 % (ref 0.0–0.2)

## 2018-10-31 LAB — BASIC METABOLIC PANEL
Anion gap: 14 (ref 5–15)
BUN: 43 mg/dL — ABNORMAL HIGH (ref 8–23)
CO2: 15 mmol/L — ABNORMAL LOW (ref 22–32)
Calcium: 8.3 mg/dL — ABNORMAL LOW (ref 8.9–10.3)
Chloride: 111 mmol/L (ref 98–111)
Creatinine, Ser: 3.02 mg/dL — ABNORMAL HIGH (ref 0.44–1.00)
GFR calc Af Amer: 18 mL/min — ABNORMAL LOW (ref >60)
GFR calc non Af Amer: 16 mL/min — ABNORMAL LOW (ref >60)
Glucose, Bld: 123 mg/dL — ABNORMAL HIGH (ref 70–99)
Potassium: 5 mmol/L (ref 3.5–5.1)
Sodium: 140 mmol/L (ref 135–145)

## 2018-10-31 LAB — TSH: TSH: 3.907 u[IU]/mL (ref 0.350–4.500)

## 2018-10-31 MED ORDER — CHLORHEXIDINE GLUCONATE CLOTH 2 % EX PADS
6.0000 | MEDICATED_PAD | Freq: Every day | CUTANEOUS | Status: DC
  Administered 2018-10-31 – 2018-11-06 (×4): 6 via TOPICAL

## 2018-10-31 MED ORDER — INFLUENZA VAC SPLIT QUAD 0.5 ML IM SUSY
0.5000 mL | PREFILLED_SYRINGE | INTRAMUSCULAR | Status: AC
  Administered 2018-11-04: 0.5 mL via INTRAMUSCULAR
  Filled 2018-10-31: qty 0.5

## 2018-10-31 NOTE — Progress Notes (Addendum)
Husband called pt requesting MD phone number. Pt asked this Probation officer for MD #, explained I can not give number but I will relay message that husband request phone call from MD. This writer tried to call husband to inquire more detail for phone call. No answer. Notified MD Manuella Ghazi via epic page. MD called pt husband no answer.

## 2018-10-31 NOTE — Progress Notes (Addendum)
Tried calling husband 4-5 times at listed # but no luck. Couldn't leave voice mail as he doesn't have it set up.

## 2018-10-31 NOTE — Evaluation (Signed)
Occupational Therapy Evaluation Patient Details Name: [name] MRN: [id number] DOB: 1955/05/09 Today's Date: 10/31/2018    History of Present Illness Pt is a 63 y.o. female presenting to hospital 10/25/18 s/p fall trying to get up from toilet; pt with generalized weakness and SOB.  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  Pt transferred to CCU 10/29/18 secondary hyperkalemia and now transferred to medical floor.  PMH includes median sternotomy, CABG, L great toe amp 6/7, cervical CA, CHF, chronic anemia, COPD, DM, NSTEMI, upper GI bleed, h/o carpal tunnel syndrome, HNP lumbar.   Clinical Impression   Pt seen for OT evaluation this date. Prior to hospital admission, pt was ambulating household distances with a SPC or SW and using a manual WC for community distances. Per chart, pt requires at least some assistance for BADL mgt at baseline. She lives with her husband who is also limited and uses a WC. Pt noted to have excessive coughing with emesis upon OT entering room. OT assisted pt with clean up/gown change. Pt requesting to go back to bed afterward. RN in room to provide +2 mod/max assist back to bed. Currently pt demonstrates impairments in functional strength, activity tolerance, and BUE use  requiring moderate assist for UB dressing this date as well as +2 assist for functional mobility/transfers. Pt would benefit from skilled OT to address noted impairments and functional limitations (see below for any additional details) in order to maximize safety and independence while minimizing falls risk and caregiver burden.  Upon hospital discharge, recommend pt discharge to STR to maximize pt safety and return to PLOF.     Follow Up Recommendations  SNF    Equipment Recommendations  Other (comment)(Pt has necessary equipment.)     Recommendations for Other Services       Precautions / Restrictions Precautions Precautions: Fall Precaution Comments: L PICC Restrictions Weight Bearing Restrictions: No      Mobility Bed Mobility Overal bed mobility: Needs Assistance Bed Mobility: Sit to Supine     Supine to sit: Max assist;HOB elevated Sit to supine: Max assist;HOB elevated;+2 for physical assistance;Mod assist   General bed mobility comments: Pt required mod/max assist +2 for sit>sup t/f this date. Required max assist for mgt of BLEs into bed with +2 to direct trunk. +2 for boost in bed.  Transfers Overall transfer level: Needs assistance Equipment used: Rolling walker (2 wheeled) Transfers: Sit to/from Stand Sit to Stand: Max assist;From elevated surface         General transfer comment: 1st trial pt unable to stand up from chair with +2 mod assist; 2nd trial pt able to stand with BLE blocked and max assist. Pt required cueing for hand/foot placement during STS. Limited carryover.    Balance Overall balance assessment: Needs assistance Sitting-balance support: No upper extremity supported;Feet supported Sitting balance-Leahy Scale: Good Sitting balance - Comments: steady sitting reaching within BOS   Standing balance support: Bilateral upper extremity supported Standing balance-Leahy Scale: Poor Standing balance comment: pt requiring heavy  B UE support on RW for static standing balance                           ADL either performed or assessed with clinical judgement   ADL  General ADL Comments: Pt currently requires max assist for bed mobility and mod assist +2 for functional transfers using a RW. Pt very limited by coughing/emesis during session. Required moderate assist to complete gown change and limited UB bathing this date.     Vision Baseline Vision/History: Wears glasses Wears Glasses: At all times Patient Visual  Report: No change from baseline       Perception     Praxis      Pertinent Vitals/Pain Pain Assessment: Faces Faces Pain Scale: Hurts even more Pain Location: Pt denies pain at rest, but yells out with attempts to lower legs in chair. monitored throughout session. Pain Intervention(s): Limited activity within patient's tolerance;Monitored during session;Repositioned     Hand Dominance Right   Extremity/Trunk Assessment Upper Extremity Assessment Upper Extremity Assessment: Generalized weakness(BUE grossly 3/5 during functional activity. Full strength assessment limited by IV/PICC placement. Will continue to assess during functional context.)   Lower Extremity Assessment Lower Extremity Assessment: Generalized weakness;Defer to PT evaluation   Cervical / Trunk Assessment Cervical / Trunk Assessment: (Forward head and shoulders during functional mobility)   Communication Communication Communication: No difficulties   Cognition Arousal/Alertness: Awake/alert Behavior During Therapy: Agitated;WFL for tasks assessed/performed Overall Cognitive Status: Within Functional Limits for tasks assessed                                 General Comments: Pt with excessive coughing and emesis during session which limited ability to participate. Pt was upset about the water not being hot enough on the towel used to clean her after emesis.   General Comments  Pt very fatigued from coughing and emesis during session. SPO2 WFL t/o.    Exercises Other Exercises Other Exercises: Pt educated on safe transfer strategies and falls prevention strategies during session. Other Exercises: Pt assisted with gown change and clean up after emesis from coughing. Pt requesting to go back to bed. RN in room for +2 assist and notified of emesis.   Shoulder Instructions      Home Living Family/patient expects to be discharged to:: Private residence Living Arrangements: Spouse/significant  other Available Help at Discharge: Family Type of Home: House Home Access: Stairs to enter Technical brewer of Steps: 3 Entrance Stairs-Rails: Right;Left;Can reach both [place]: One level     Bathroom Shower/Tub: Teacher, early years/pre: Standard(BSC over toilet)     Home Equipment: Environmental consultant - 2 wheels;Wheelchair - [employer];Tub bench;Grab bars - tub/shower          Prior Functioning/Environment Level of Independence: Needs assistance  Gait / Transfers Assistance Needed: Ambulates around 20 feet with SPC or SW; uses manual w/c (self propels) in community/outside ADL's / Homemaking Assistance Needed: Per chart, pt requires assist for ADL/IADL at baseline.            OT Problem List: Decreased strength;Decreased coordination;Cardiopulmonary status limiting activity;Decreased range of motion;Increased edema;Decreased activity tolerance;Decreased safety awareness;Impaired balance (sitting and/or standing);Decreased knowledge of precautions      OT Treatment/Interventions: Self-care/ADL training;Balance training;Therapeutic exercise;Therapeutic activities;DME and/or AE instruction;Patient/family education    OT Goals(Current goals can be found in the care plan section) Acute Rehab OT Goals Patient Stated Goal: to get stronger and be able to walk OT Goal Formulation: With patient Time For Goal Achievement: 11/14/18 Potential to Achieve Goals: Good ADL Goals Pt Will Perform Grooming: sitting;with set-up;with supervision(With LRAD PRN for improved safety and functional independence) Pt Will Perform  Lower Body Bathing: sitting/lateral leans;with min assist;with adaptive equipment(With LRAD PRN for improved safety and functional independence) Pt Will Perform Lower Body Dressing: sit to/from stand;with adaptive equipment;with min assist(With LRAD PRN for improved safety and functional independence)  OT Frequency: Min 1X/week   Barriers to D/C:  Inaccessible home environment;Decreased caregiver support          Co-evaluation              AM-PAC OT "6 Clicks" Daily Activity     Outcome Measure Help from another person eating meals?: None Help from another person taking care of personal grooming?: A Little Help from another person toileting, which includes using toliet, bedpan, or urinal?: A Lot Help from another person bathing (including washing, rinsing, drying)?: A Lot Help from another person to put on and taking off regular upper body clothing?: A Lot Help from another person to put on and taking off regular lower body clothing?: A Lot 6 Click Score: 15   End of Session Equipment Utilized During Treatment: Gait belt;Rolling walker  Activity Tolerance: Patient limited by fatigue Patient left: in bed;with call bell/phone within reach;with bed alarm set  OT Visit Diagnosis: Other abnormalities of gait and mobility (R26.89);Muscle weakness (generalized) (M62.81)                Time: 7034-0352 OT Time Calculation (min): 27 min Charges:  OT General Charges $OT Visit: 1 Visit OT Evaluation $OT Eval Moderate Complexity: 1 Mod OT Treatments $Self Care/Home Management : 8-22 mins  Shara Blazing, M.S., OTR/L Ascom: [phone] 10/31/18, 3:37 PM

## 2018-10-31 NOTE — Progress Notes (Signed)
Date of Admission:  10/25/2018    *      Subjective: Patient feeling better No nausea or vomiting Breathing better Appetite better  Medications:   sodium chloride   Intravenous Once   atorvastatin  40 mg Oral Daily   Chlorhexidine Gluconate Cloth  6 each Topical Daily   fluticasone  2 spray Each Nare Daily   folic acid  1 mg Oral Daily   furosemide  40 mg Intravenous BID   insulin aspart  0-5 Units Subcutaneous QHS   insulin aspart  0-9 Units Subcutaneous TID WC   insulin glargine  10 Units Subcutaneous QHS   magnesium citrate  1 Bottle Oral Once   midodrine  5 mg Oral TID WC   mometasone-formoterol  2 puff Inhalation BID   pantoprazole  40 mg Intravenous Q12H   pregabalin  75 mg Oral BID    Objective: Vital signs in last 24 hours: Temp:  [97.4 F (36.3 C)-98.9 F (37.2 C)] 97.4 F (36.3 C) (10/14 0807) Pulse Rate:  [88-102] 95 (10/14 0807) Resp:  [15-48] 19 (10/14 0807) BP: (132-164)/(60-88) 139/74 (10/14 0807) SpO2:  [97 %-100 %] 98 % (10/14 0807) Weight:  [123.1 kg] 123.1 kg (10/13 2303)  PHYSICAL EXAM:  General: Alert, cooperative, no distress, chronically ill Lungs: Bilateral air entry decreased basis Heart: Systolic murmur Sternal scar Abdomen: Soft, non-tender,not distended. Bowel sounds normal. No masses Extremities: Left great toe amputated site fine Pressure hemorrhagic blister on the right foot Skin: Rash over the arms have resolved Lymph: Cervical, supraclavicular normal. Neurologic: Grossly non-focal Back small wound on the left gluteal fold area    Lab Results Recent Labs    10/30/18 0447 10/31/18 0645  WBC 9.5 7.3  HGB 8.0* 7.7*  HCT 26.7* 26.6*  NA 134* 140  K 5.6* 5.0  CL 107 111  CO2 18* 15*  BUN 41* 43*  CREATININE 3.11* 3.02*   Liver Panel No results for input(s): PROT, ALBUMIN, AST, ALT, ALKPHOS, BILITOT, BILIDIR, IBILI in the last 72 hours. Sedimentation Rate No results for input(s): ESRSEDRATE in the last  72 hours. C-Reactive Protein No results for input(s): CRP in the last 72 hours.  Microbiology:  Studies/Results: Dg Abd 1 View  Result Date: 10/31/2018 CLINICAL DATA:  Ileus EXAM: ABDOMEN - 1 VIEW COMPARISON:  Yesterday FINDINGS: No dilated bowel. Moderate stool intermittently seen along the colon. No concerning mass effect or calcification. High-density material over the pelvis was not seen on abdominal CT from 4 days ago and has progressed by radiography. IMPRESSION: 1. Nonobstructive bowel gas pattern. 2. Moderate stool volume. 3. A high-density structure is progressing through the colon, now at the rectum. Is there a missing dental cap? Electronically Signed   By: Monte Fantasia M.D.   On: 10/31/2018 05:36   Dg Abd 1 View  Result Date: 10/30/2018 CLINICAL DATA:  Pulmonary disease.  Possible ileus. EXAM: ABDOMEN - 1 VIEW COMPARISON:  CT 10/26/2018. FINDINGS: Prior median sternotomy, CABG, and cardiac valve replacement. Surgical clips right upper quadrant. Air-filled loops of small and large bowel noted. These are nondilated. Follow-up exam suggested to exclude developing bowel distention. No free air. Metallic density of unknown etiology noted over the left abdomen. Degenerative changes thoracolumbar spine and both hips. IMPRESSION: Air-filled loops of small large bowel noted. These are nondilated. Follow-up exam suggest to exclude developing bowel distention. Electronically Signed   By: [place]   On: 10/30/2018 09:57   Dg Chest Port 1 View  Result  Date: 10/31/2018 CLINICAL DATA:  Acute respiratory failure EXAM: PORTABLE CHEST 1 VIEW COMPARISON:  Yesterday FINDINGS: Chronic cardiomegaly. 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 Vascular congestion. Mild reticulation in the left mid lung, likely scarring. There is no edema, consolidation, effusion, or pneumothorax. IMPRESSION: Cardiomegaly and vascular congestion. Electronically Signed   By: Monte Fantasia M.D.   On:  10/31/2018 05:34   Dg Chest Port 1 View  Result Date: 10/30/2018 CLINICAL DATA:  Pulmonary disease.  Possible ileus. EXAM: PORTABLE CHEST 1 VIEW COMPARISON:  CT 10/26/2018.  Chest x-ray 10/25/2018. FINDINGS: Prior median sternotomy, CABG, and cardiac valve replacement. Stable cardiomegaly. Low lung volumes. Stay chronic bilateral mild interstitial prominence. No focal alveolar infiltrate. No pleural effusion or pneumothorax. IMPRESSION: 1. Prior median sternotomy, CABG, and cardiac valve replacement. Stable cardiomegaly. 2. Low lung volumes with stable chronic bilateral mild interstitial prominence. No acute alveolar infiltrate noted. Electronically Signed   By: Marcello Moores  Register   On: 10/30/2018 09:55     Assessment/Plan:   Weakness and fall related to severe anemia and bacteremia  Streptococcus C bacteremia.  Could be a GI source or a skin source from the left gluteal wound.  No  prosthetic valve endocarditis. Patient is day 6 of ampicillin.  Will need a minimum of 10 days.  As long as she is in the hospital she can continue with IV antibiotics.  End date 11/04/2018.  But on discharge to convert to p.o ampicillin adjusted to creatinine clearance.  Severe anemia: No obvious GI bleed this admission.  But has small bowel AVM in the past studies.  GI is on board and plan to do push enteroscopy sometime in the future.     AKI on CKD.  Good urine output.  Followed by nephrologist  Hyperkalemia resolved  Discussed the management with the patient and the hospitalist. ID will sign off call if needed.

## 2018-10-31 NOTE — Progress Notes (Signed)
[facility], [place] 10/31/18  Subjective:  Patient transition back to floor care. Still has significant lower extremity edema. Creatinine still high at 3.02.    Objective:  Vital signs in last 24 hours:  Temp:  [97.4 F (36.3 C)-98.4 F (36.9 C)] 97.4 F (36.3 C) (10/14 0807) Pulse Rate:  [88-102] 95 (10/14 0807) Resp:  [15-48] 19 (10/14 0807) BP: (132-164)/(60-88) 139/74 (10/14 0807) SpO2:  [97 %-100 %] 98 % (10/14 0807) Weight:  [123.1 kg] 123.1 kg (10/13 2303)  Weight change: -3.694 kg Filed Weights   10/25/18 2215 10/29/18 1532 10/30/18 2303  Weight: 125.1 kg 126.8 kg 123.1 kg    Intake/Output:    Intake/Output Summary (Last 24 hours) at 10/31/2018 1408 Last data filed at 10/31/2018 1036 Gross per 24 hour  Intake 503.19 ml  Output 6000 ml  Net -5496.81 ml     Physical Exam: General:  Obese female, laying in the bed, no acute distress  HEENT  moist oral mucous membranes  Neck  supple  Pulm/lungs  CTAB normal effort  CVS/Heart  regular rhythm, soft systolic murmur  Abdomen:   Soft, nontender  Extremities:  3+ pitting edema over thighs and lower abdomen  Neurologic:  Alert, oriented  Skin:  No acute rashes    Basic Metabolic Panel:  Recent Labs  Lab 10/27/18 0350 10/28/18 0048 10/29/18 0410 10/29/18 0904 10/30/18 0447 10/31/18 0645  NA 135 134* 134*  --  134* 140  K 4.8 5.0 6.3* 5.1 5.6* 5.0  CL 109 108 109  --  107 111  CO2 19* 19* 17*  --  18* 15*  GLUCOSE 98 105* 136*  --  115* 123*  BUN 33* 39* 43*  --  41* 43*  CREATININE 2.95* 3.41* 3.54*  --  3.11* 3.02*  CALCIUM 7.9* 7.9* 7.8*  --  7.8* 8.3*     CBC: Recent Labs  Lab 10/25/18 2038 10/26/18 0500 10/27/18 0350 10/28/18 0048 10/30/18 0447 10/31/18 0645  WBC 14.0* 15.1* 17.9* 17.8* 9.5 7.3  NEUTROABS 12.7*  --  15.7*  --   --   --   HGB 5.6* 5.4* 6.6* 8.1* 8.0* 7.7*  HCT 20.4* 20.0* 22.1* 27.2* 26.7* 26.6*  MCV 78.5* 79.1* 77.8* 81.4 79.0* 81.8   PLT 182 156 160 193 151 179      Lab Results  Component Value Date   HEPBSAG NON REACTIVE 10/27/2018   HEPBIGM NON REACTIVE 10/27/2018      Microbiology:  Recent Results (from the past 240 hour(s))  Blood culture (routine x 2)     Status: Abnormal   Collection Time: 10/25/18  8:38 PM   Specimen: BLOOD  Result Value Ref Range Status   Specimen Description   Final    BLOOD LEFT ANTECUBITAL Performed at [facility], [address] [place], [place] [address]    Special Requests   Final    BOTTLES DRAWN AEROBIC AND ANAEROBIC Blood Culture results may not be optimal due to an excessive volume of blood received in culture bottles Performed at [facility], [address] [place], [place] [address]    Culture  Setup Time   Final    GRAM POSITIVE COCCI IN BOTH AEROBIC AND ANAEROBIC BOTTLES CRITICAL RESULT CALLED TO, READ BACK BY AND VERIFIED WITH: CHARLES SHANLEVER @0835  ON 10/26/2018 BY FMW Performed at [facility], [address] [place], [place] [address]    Culture STREPTOCOCCUS GROUP C (A)  Final  Report Status 10/28/2018 FINAL  Final   Organism ID, Bacteria STREPTOCOCCUS GROUP C  Final      Susceptibility   Streptococcus group c - MIC*    CLINDAMYCIN <=0.25 SENSITIVE Sensitive     AMPICILLIN <=0.25 SENSITIVE Sensitive     ERYTHROMYCIN <=0.12 SENSITIVE Sensitive     VANCOMYCIN 0.5 SENSITIVE Sensitive     CEFTRIAXONE <=0.12 SENSITIVE Sensitive     LEVOFLOXACIN 0.5 SENSITIVE Sensitive     PENICILLIN Value in next row Sensitive      SENSITIVE<=0.06    * STREPTOCOCCUS GROUP C  Blood culture (routine x 2)     Status: Abnormal   Collection Time: 10/25/18  8:38 PM   Specimen: BLOOD  Result Value Ref Range Status   Specimen Description   Final    BLOOD RIGHT ANTECUBITAL Performed at [facility], [address] [place], [place] [address]    Special Requests   Final    BOTTLES DRAWN AEROBIC AND ANAEROBIC Blood Culture results  may not be optimal due to an excessive volume of blood received in culture bottles Performed at [facility], [address] [place], [place] [address]    Culture  Setup Time   Final    GRAM POSITIVE COCCI IN BOTH AEROBIC AND ANAEROBIC BOTTLES CRITICAL VALUE NOTED.  VALUE IS CONSISTENT WITH PREVIOUSLY REPORTED AND CALLED VALUE. Performed at [facility], [address] [place], [place] [address]    Culture (A)  Final    STREPTOCOCCUS GROUP C SUSCEPTIBILITIES PERFORMED ON PREVIOUS CULTURE WITHIN THE LAST 5 DAYS. Performed at [facility], [address], [place], [place] [address]    Report Status 10/28/2018 FINAL  Final  Blood Culture ID Panel (Reflexed)     Status: Abnormal   Collection Time: 10/25/18  8:38 PM  Result Value Ref Range Status   Enterococcus species NOT DETECTED NOT DETECTED Final   Listeria monocytogenes NOT DETECTED NOT DETECTED Final   Staphylococcus species NOT DETECTED NOT DETECTED Final   Staphylococcus aureus (BCID) NOT DETECTED NOT DETECTED Final   Streptococcus species DETECTED (A) NOT DETECTED Final    Comment: Not Enterococcus species, Streptococcus agalactiae, Streptococcus pyogenes, or Streptococcus pneumoniae. CRITICAL RESULT CALLED TO, READ BACK BY AND VERIFIED WITH: CHARLES SHANLEVER @0835  ON 10/26/2018 BY FMW    Streptococcus agalactiae NOT DETECTED NOT DETECTED Final   Streptococcus pneumoniae NOT DETECTED NOT DETECTED Final   Streptococcus pyogenes NOT DETECTED NOT DETECTED Final   Acinetobacter baumannii NOT DETECTED NOT DETECTED Final   Enterobacteriaceae species NOT DETECTED NOT DETECTED Final   Enterobacter cloacae complex NOT DETECTED NOT DETECTED Final   Escherichia coli NOT DETECTED NOT DETECTED Final   Klebsiella oxytoca NOT DETECTED NOT DETECTED Final   Klebsiella pneumoniae NOT DETECTED NOT DETECTED Final   Proteus species NOT DETECTED NOT DETECTED Final   Serratia marcescens NOT DETECTED NOT DETECTED Final    Haemophilus influenzae NOT DETECTED NOT DETECTED Final   Neisseria meningitidis NOT DETECTED NOT DETECTED Final   Pseudomonas aeruginosa NOT DETECTED NOT DETECTED Final   Candida albicans NOT DETECTED NOT DETECTED Final   Candida glabrata NOT DETECTED NOT DETECTED Final   Candida krusei NOT DETECTED NOT DETECTED Final   Candida parapsilosis NOT DETECTED NOT DETECTED Final   Candida tropicalis NOT DETECTED NOT DETECTED Final    Comment: Performed at [facility], [address] [place], [place] [address]  SARS Coronavirus 2 by RT PCR (hospital order, performed in [place] hospital  lab) Nasopharyngeal Nasopharyngeal Swab     Status: None   Collection Time: 10/25/18  8:49 PM   Specimen: Nasopharyngeal Swab  Result Value Ref Range Status   SARS Coronavirus 2 NEGATIVE NEGATIVE Final    Comment: (NOTE) If result is NEGATIVE SARS-CoV-2 target nucleic acids are NOT DETECTED. The SARS-CoV-2 RNA is generally detectable in upper and lower  respiratory specimens during the acute phase of infection. The lowest  concentration of SARS-CoV-2 viral copies this assay can detect is 250  copies / mL. A negative result does not preclude SARS-CoV-2 infection  and should not be used as the sole basis for treatment or other  patient management decisions.  A negative result may occur with  improper specimen collection / handling, submission of specimen other  than nasopharyngeal swab, presence of viral mutation(s) within the  areas targeted by this assay, and inadequate number of viral copies  (<250 copies / mL). A negative result must be combined with clinical  observations, patient history, and epidemiological information. If result is POSITIVE SARS-CoV-2 target nucleic acids are DETECTED. The SARS-CoV-2 RNA is generally detectable in upper and lower  respiratory specimens dur ing the acute phase of infection.  Positive  results are indicative of active infection with SARS-CoV-2.  Clinical   correlation with patient history and other diagnostic information is  necessary to determine patient infection status.  Positive results do  not rule out bacterial infection or co-infection with other viruses. If result is PRESUMPTIVE POSTIVE SARS-CoV-2 nucleic acids MAY BE PRESENT.   A presumptive positive result was obtained on the submitted specimen  and confirmed on repeat testing.  While 2019 novel coronavirus  (SARS-CoV-2) nucleic acids may be present in the submitted sample  additional confirmatory testing may be necessary for epidemiological  and / or clinical management purposes  to differentiate between  SARS-CoV-2 and other Sarbecovirus currently known to infect humans.  If clinically indicated additional testing with an alternate test  methodology [phone]) is advised. The SARS-CoV-2 RNA is generally  detectable in upper and lower respiratory sp ecimens during the acute  phase of infection. The expected result is Negative. Fact Sheet for Patients:  StrictlyIdeas.no Fact Sheet for Healthcare Providers: BankingDealers.co.za This test is not yet approved or cleared by the [place] FDA and has been authorized for detection and/or diagnosis of SARS-CoV-2 by FDA under an Emergency Use Authorization (EUA).  This EUA will remain in effect (meaning this test can be used) for the duration of the COVID-19 declaration under Section 564(b)(1) of the Act, 21 U.S.C. section 360bbb-3(b)(1), unless the authorization is terminated or revoked sooner. Performed at [facility], [address] [place], [place] [address]   MRSA PCR Screening     Status: Abnormal   Collection Time: 10/26/18 11:09 AM   Specimen: Nasopharyngeal  Result Value Ref Range Status   MRSA by PCR POSITIVE (A) NEGATIVE Final    Comment:        The GeneXpert MRSA Assay (FDA approved for NASAL specimens only), is one component of a comprehensive MRSA  colonization surveillance program. It is not intended to diagnose MRSA infection nor to guide or monitor treatment for MRSA infections. RESULT CALLED TO, READ BACK BY AND VERIFIED WITH: EMILY GANNON AT 1630 10/26/2018.PMF Performed at [facility], [place], [place] [address]   CULTURE, BLOOD (ROUTINE X 2) w Reflex to ID Panel     Status: None (Preliminary result)   Collection Time: 10/28/18 12:48 AM  Specimen: BLOOD  Result Value Ref Range Status   Specimen Description BLOOD RIGHT ANTECUBITAL  Final   Special Requests   Final    BOTTLES DRAWN AEROBIC AND ANAEROBIC Blood Culture adequate volume   Culture   Final    NO GROWTH 3 DAYS Performed at [facility], [address] [place], [place] [address]    Report Status PENDING  Incomplete  CULTURE, BLOOD (ROUTINE X 2) w Reflex to ID Panel     Status: None (Preliminary result)   Collection Time: 10/28/18  5:06 AM   Specimen: BLOOD  Result Value Ref Range Status   Specimen Description BLOOD LEFT WRIST  Final   Special Requests   Final    BOTTLES DRAWN AEROBIC AND ANAEROBIC Blood Culture adequate volume   Culture   Final    NO GROWTH 3 DAYS Performed at [facility], [address] [place], [place] [address]    Report Status PENDING  Incomplete    Coagulation Studies: No results for input(s): LABPROT, INR in the last 72 hours.  Urinalysis: No results for input(s): COLORURINE, LABSPEC, PHURINE, GLUCOSEU, HGBUR, BILIRUBINUR, KETONESUR, PROTEINUR, UROBILINOGEN, NITRITE, LEUKOCYTESUR in the last 72 hours.  Invalid input(s): APPERANCEUR    Imaging: Dg Abd 1 View  Result Date: 10/31/2018 CLINICAL DATA:  Ileus EXAM: ABDOMEN - 1 VIEW COMPARISON:  Yesterday FINDINGS: No dilated bowel. Moderate stool intermittently seen along the colon. No concerning mass effect or calcification. High-density material over the pelvis was not seen on abdominal CT from 4 days ago and has progressed by  radiography. IMPRESSION: 1. Nonobstructive bowel gas pattern. 2. Moderate stool volume. 3. A high-density structure is progressing through the colon, now at the rectum. Is there a missing dental cap? Electronically Signed   By: Monte Fantasia M.D.   On: 10/31/2018 05:36   Dg Abd 1 View  Result Date: 10/30/2018 CLINICAL DATA:  Pulmonary disease.  Possible ileus. EXAM: ABDOMEN - 1 VIEW COMPARISON:  CT 10/26/2018. FINDINGS: Prior median sternotomy, CABG, and cardiac valve replacement. Surgical clips right upper quadrant. Air-filled loops of small and large bowel noted. These are nondilated. Follow-up exam suggested to exclude developing bowel distention. No free air. Metallic density of unknown etiology noted over the left abdomen. Degenerative changes thoracolumbar spine and both hips. IMPRESSION: Air-filled loops of small large bowel noted. These are nondilated. Follow-up exam suggest to exclude developing bowel distention. Electronically Signed   By: Marcello Moores  Register   On: 10/30/2018 09:57   Dg Chest Port 1 View  Result Date: 10/31/2018 CLINICAL DATA:  Acute respiratory failure EXAM: PORTABLE CHEST 1 VIEW COMPARISON:  Yesterday FINDINGS: Chronic cardiomegaly. 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 Vascular congestion. Mild reticulation in the left mid lung, likely scarring. There is no edema, consolidation, effusion, or pneumothorax. IMPRESSION: Cardiomegaly and vascular congestion. Electronically Signed   By: Monte Fantasia M.D.   On: 10/31/2018 05:34   Dg Chest Port 1 View  Result Date: 10/30/2018 CLINICAL DATA:  Pulmonary disease.  Possible ileus. EXAM: PORTABLE CHEST 1 VIEW COMPARISON:  CT 10/26/2018.  Chest x-ray 10/25/2018. FINDINGS: Prior median sternotomy, CABG, and cardiac valve replacement. Stable cardiomegaly. Low lung volumes. Stay chronic bilateral mild interstitial prominence. No focal alveolar infiltrate. No pleural effusion or pneumothorax. IMPRESSION: 1. Prior median  sternotomy, CABG, and cardiac valve replacement. Stable cardiomegaly. 2. Low lung volumes with stable chronic bilateral mild interstitial prominence. No acute alveolar infiltrate noted. Electronically Signed   By: Marcello Moores  Register  On: 10/30/2018 09:55     Medications:   . sodium chloride 250 mL (10/31/18 0543)  . ampicillin (OMNIPEN) IV 2 g (10/31/18 1344)   . sodium chloride   Intravenous Once  . atorvastatin  40 mg Oral Daily  . Chlorhexidine Gluconate Cloth  6 each Topical Daily  . fluticasone  2 spray Each Nare Daily  . folic acid  1 mg Oral Daily  . furosemide  40 mg Intravenous BID  . insulin aspart  0-5 Units Subcutaneous QHS  . insulin aspart  0-9 Units Subcutaneous TID WC  . insulin glargine  10 Units Subcutaneous QHS  . magnesium citrate  1 Bottle Oral Once  . midodrine  5 mg Oral TID WC  . mometasone-formoterol  2 puff Inhalation BID  . pantoprazole  40 mg Intravenous Q12H  . pregabalin  75 mg Oral BID   sodium chloride, acetaminophen, albuterol  Assessment/ Plan:  63 y.o. African American female with Medical problems of poorly controlled diabetes, hepatic cirrhosis, admission for gastric ulcer in 2018, congestive heart failure, grade 2 diastolic dysfunction, concentric LVH, Chronic low back pain, obstructive sleep apnea, history of CAD/STEMI, hepatitis C 2016  #Acute kidney injury/#Chronic kidney disease stage III Baseline creatinine 1.20/GFR 48 from August 29, 2018 Urinalysis from August 2 shows moderate blood, 0-5 RBCs, 0-5 WBCs.  Negative for protein.  UPEP negative for M spike in 2018 and Jan 2020. SPEP neg for M spike in 01/2018  Renal imaging in the form of CT abdomen pelvis without contrast shows bilateral perinephric stranding, no stone or obstruction. -Creatinine very slightly improved down to 3.02.  Good urine output noted.  EGFR currently 18.  No acute indication for dialysis.   #Anemia of chronic kidney disease -Hemoglobin continues to drift down and is  currently 7.7.  Consider blood transfusion for hemoglobin of 7 or less.  #Generalized edema 2D echo from August 2020 shows low normal LV EF of 50 to 55%.  Mild concentric LVH and diastolic dysfunction Patient is noted to have hypoalbuminemia -Continue diuresis at the moment.    LOS: 5 [name] 10/14/20202:08 PM  [place]  Note: This note was prepared with Dragon dictation. Any transcription errors are unintentional

## 2018-10-31 NOTE — Progress Notes (Signed)
[facility] [place] NAME: [name]    MR#:  [id number]  DATE OF BIRTH:  [birth year]  SUBJECTIVE:  Slowly improving REVIEW OF SYSTEMS:  CONSTITUTIONAL: No fever, reports fatigue or weakness.  EYES: No blurred or double vision.  EARS, NOSE, AND THROAT: No tinnitus or ear pain.  RESPIRATORY: No cough, shortness of breath, wheezing or hemoptysis.  CARDIOVASCULAR: No chest pain, orthopnea, edema.  GASTROINTESTINAL: No nausea, vomiting, diarrhea or abdominal pain.  GENITOURINARY: No dysuria, hematuria.  ENDOCRINE: No polyuria, nocturia,  HEMATOLOGY: No anemia, easy bruising or bleeding SKIN: No rash or lesion. MUSCULOSKELETAL: No joint pain or arthritis.   NEUROLOGIC: No tingling, numbness, weakness.  PSYCHIATRY: No anxiety or depression.  DRUG ALLERGIES:   Allergies  Allergen Reactions  . Latex Anaphylaxis and Rash  . Gabapentin Nausea And Vomiting and Other (See Comments)    VITALS:  Blood pressure 139/74, pulse 95, temperature (!) 97.4 F (36.3 C), temperature source Oral, resp. rate 19, height 5' 8"  (1.727 m), weight 123.1 kg, SpO2 98 %. PHYSICAL EXAMINATION:  GENERAL:  63 y.o.-year-old patient lying in the bed with no acute distress.  EYES: Pupils equal, round, reactive to light and accommodation. No scleral icterus. Extraocular muscles intact.  HEENT: Head atraumatic, normocephalic. Oropharynx and nasopharynx clear.  NECK:  Supple, no jugular venous distention. No thyroid enlargement, no tenderness.  LUNGS: Normal breath sounds bilaterally, no wheezing, rales,rhonchi or crepitation. No use of accessory muscles of respiration.  CARDIOVASCULAR: S1, S2 normal. No murmurs, rubs, or gallops.  ABDOMEN: Soft, nontender, nondistended. Bowel sounds present.  EXTREMITIES: No pedal edema, cyanosis, or clubbing.  NEUROLOGIC: Patient is arousable oriented x3 sensation intact. Gait not checked.  PSYCHIATRIC: The patient is alert  and oriented x3.  SKIN: No obvious rash, lesion, or ulcer.  LABORATORY PANEL:   CBC Recent Labs  Lab 10/31/18 0645  WBC 7.3  HGB 7.7*  HCT 26.6*  PLT 179   ------------------------------------------------------------------------------------------------------------------  Chemistries  Recent Labs  Lab 10/27/18 0350  10/31/18 0645  NA 135   < > 140  K 4.8   < > 5.0  CL 109   < > 111  CO2 19*   < > 15*  GLUCOSE 98   < > 123*  BUN 33*   < > 43*  CREATININE 2.95*   < > 3.02*  CALCIUM 7.9*   < > 8.3*  AST 17  --   --   ALT 9  --   --   ALKPHOS 74  --   --   BILITOT 2.5*  --   --    < > = values in this interval not displayed.   ------------------------------------------------------------------------------------------------------------------  Cardiac Enzymes No results for input(s): TROPONINI in the last 168 hours. ------------------------------------------------------------------------------------------------------------------  RADIOLOGY:  Dg Abd 1 View  Result Date: 10/31/2018 CLINICAL DATA:  Ileus EXAM: ABDOMEN - 1 VIEW COMPARISON:  Yesterday FINDINGS: No dilated bowel. Moderate stool intermittently seen along the colon. No concerning mass effect or calcification. High-density material over the pelvis was not seen on abdominal CT from 4 days ago and has progressed by radiography. IMPRESSION: 1. Nonobstructive bowel gas pattern. 2. Moderate stool volume. 3. A high-density structure is progressing through the colon, now at the rectum. Is there a missing dental cap? Electronically Signed   By: Monte Fantasia M.D.   On: 10/31/2018 05:36   Dg Abd 1 View  Result Date: 10/30/2018 CLINICAL DATA:  Pulmonary disease.  Possible ileus. EXAM: ABDOMEN - 1 VIEW COMPARISON:  CT 10/26/2018. FINDINGS: Prior median sternotomy, CABG, and cardiac valve replacement. Surgical clips right upper quadrant. Air-filled loops of small and large bowel noted. These are nondilated. Follow-up exam  suggested to exclude developing bowel distention. No free air. Metallic density of unknown etiology noted over the left abdomen. Degenerative changes thoracolumbar spine and both hips. IMPRESSION: Air-filled loops of small large bowel noted. These are nondilated. Follow-up exam suggest to exclude developing bowel distention. Electronically Signed   By: Marcello Moores  Register   On: 10/30/2018 09:57   Dg Chest Port 1 View  Result Date: 10/31/2018 CLINICAL DATA:  Acute respiratory failure EXAM: PORTABLE CHEST 1 VIEW COMPARISON:  Yesterday FINDINGS: Chronic cardiomegaly. 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 Vascular congestion. Mild reticulation in the left mid lung, likely scarring. There is no edema, consolidation, effusion, or pneumothorax. IMPRESSION: Cardiomegaly and vascular congestion. Electronically Signed   By: Monte Fantasia M.D.   On: 10/31/2018 05:34   Dg Chest Port 1 View  Result Date: 10/30/2018 CLINICAL DATA:  Pulmonary disease.  Possible ileus. EXAM: PORTABLE CHEST 1 VIEW COMPARISON:  CT 10/26/2018.  Chest x-ray 10/25/2018. FINDINGS: Prior median sternotomy, CABG, and cardiac valve replacement. Stable cardiomegaly. Low lung volumes. Stay chronic bilateral mild interstitial prominence. No focal alveolar infiltrate. No pleural effusion or pneumothorax. IMPRESSION: 1. Prior median sternotomy, CABG, and cardiac valve replacement. Stable cardiomegaly. 2. Low lung volumes with stable chronic bilateral mild interstitial prominence. No acute alveolar infiltrate noted. Electronically Signed   By: Marcello Moores  Register   On: 10/30/2018 09:55    EKG:   Orders placed or performed during the hospital encounter of 10/25/18  . EKG 12-Lead  . EKG 12-Lead  . EKG 12-Lead  . EKG 12-Lead  . EKG 12-Lead  . EKG 12-Lead  . EKG 12-Lead  . EKG 12-Lead    ASSESSMENT AND PLAN:  63 y.o. 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 Delana Meyer, and CKD to the ED with worsening shortness of breath and fall.  1. Sepsis with multiorgan failure -sepsis present on admission - chest x-ray shows no active cardiopulmonary disease - Covid negative - CT abdomen and pelvis concerning for acute pyelonephritis -right more than the left perinephric stranding - Blood cultures strep group C in 4 out of 4 bottles - continue ampicillin for 2 weeks per ID, will check with ID to see if she will need a PICC line - TEE neg for any valve vegetations  2.  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  GI is planning to do push enteroscopy while she is here - Onco seen - unlikely hemolysis per Dr Mike Gip  3. Elevated troponin -due to demand ischemia  4. DM: sugars have been well-controlled on current regimen  - hemoglobin A1c 1 6/5 was 5.9 - sliding scale insulin coverage in addition to Lantus, cut back on dose of lantus as sugars running low - ADA 2100 calorie diet  5. Chronic congestive heart failure with reduced EF - stable Echocardiogram with moderate LVH and EF at 40 to45% and diffuse hypokinesis noted. Patient has had bioprosthesis for aortic valve - lasix 40 mg IV bid, -5 L fluid balance - Low salt diet   6. COPD -noevidence of exacerbation.  - Supplemental O2, goal sat 88-92% - Duonebs Q 6 hours - Continue home inhalers  7. Acute on chronic Kidney Disease 3 -improving creat 3.54->  3.0 - Hold nephrotoxins, Nephrology following - Continue to monitor renal function - reduced Lyrica dose  8.  Anasarca - on Lasix 40 mg IV bid now, neg 5 L  9.weakness-PT eval  10. Hyperkalemia: Resolved    Transferred back to the floors last evening.  Doing better   All the records are reviewed and case discussed with Care Management/Social Worker. Management plans discussed with the patient, nursing and they are in agreement.  CODE STATUS: Full code  TOTAL TIME TAKING CARE OF THIS PATIENT: 35 minutes.   POSSIBLE D/C IN 2-3 DAYS, DEPENDING ON CLINICAL CONDITION.  Note: This dictation was prepared with Dragon dictation along with smaller phrase technology. Any transcriptional errors that result from this process are unintentional.   Max Sane M.D on 10/31/2018 at 11:52 AM  Between 7am to 6pm - Pager - [phone] After 6pm go to [email or web address] - password [email or web address] [facility]  Office  [phone]  CC: Primary care physician; [place]

## 2018-10-31 NOTE — Evaluation (Signed)
Physical Therapy Evaluation Patient Details Name: [name] MRN: [id number] DOB: [birth year] Today's Date: 10/31/2018   History of Present Illness  Pt is a 63 y.o. female presenting to hospital 10/25/18 s/p fall trying to get up from toilet; pt with generalized weakness and SOB.  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  Pt transferred to CCU 10/29/18 secondary hyperkalemia and now transferred to medical floor.  PMH includes median sternotomy, CABG, L great toe amp 6/7, cervical CA, CHF, chronic anemia, COPD, DM, NSTEMI, upper GI bleed, h/o carpal tunnel syndrome, HNP lumbar.  Clinical Impression  Prior to hospital admission, pt was modified independent ambulating short household distances (around 20 feet) with SPC or SW; uses manual w/c in community.  Pt lives with her husband in 1 level home.  Currently pt is max assist semi-supine to sit; max assist to stand from mildly elevated bed with RW; and min assist to walk a few feet with RW bed to recliner (increased effort and time to perform required and SOB noted--O2 sats on room air and HR WFL during session's activities).  Pt would benefit from skilled PT to address noted impairments and functional limitations (see below for any additional details).  Upon hospital discharge, currently recommend pt discharge to STR d/t level of assist required with functional mobility, generalized weakness, and decreased activity tolerance; pt would like to discharge home if possible.    Follow Up Recommendations SNF    Equipment Recommendations  Rolling walker with 5" wheels;3in1 (PT);Wheelchair (measurements PT);Wheelchair cushion (measurements PT)    Recommendations for Other Services OT consult     Precautions / Restrictions Precautions Precautions: Fall Precaution Comments: L  PICC Restrictions Weight Bearing Restrictions: No      Mobility  Bed Mobility Overal bed mobility: Needs Assistance Bed Mobility: Supine to Sit     Supine to sit: Max assist;HOB elevated     General bed mobility comments: pt able to move B LE's towards edge of bed but required max assist for trunk to sit up and to scoot to edge of bed  Transfers Overall transfer level: Needs assistance Equipment used: Rolling walker (2 wheeled) Transfers: Sit to/from Stand Sit to Stand: Max assist;From elevated surface         General transfer comment: 1st trial pt unable to stand up from bed with 1 assist; 2nd trial pt able to stand from mildly elevated bed with  max assist x1; vc's for UE/LE placement  Ambulation/Gait Ambulation/Gait assistance: Min assist Gait Distance (Feet): 3 Feet(bed to recliner) Assistive device: Rolling walker (2 wheeled)   Gait velocity: decreased   General Gait Details: increased effort/time to take steps; heavy UE reliance on walker  Stairs            Wheelchair Mobility    Modified Rankin (Stroke Patients Only)       Balance Overall balance assessment: Needs assistance Sitting-balance support: No upper extremity supported;Feet supported Sitting balance-Leahy Scale: Good Sitting balance - Comments: steady sitting reaching within BOS   Standing balance support: Bilateral upper extremity supported Standing balance-Leahy Scale: Poor Standing balance comment: pt requiring heavy  B UE support on RW for static standing balance                             Pertinent Vitals/Pain Pain Assessment: No/denies pain  Home Living Family/patient expects to be discharged to:: Private residence Living Arrangements: Spouse/significant other Available Help at Discharge: Family Type of Home: House Home Access: Stairs to enter Entrance Stairs-Rails: Right;Left;Can reach both Entrance Stairs-Number of Steps: 3 Home Layout: One level Home  Equipment: [place] - 2 wheels;Wheelchair - [employer];Tub bench;Grab bars - tub/shower      Prior Function Level of Independence: Needs assistance   Gait / Transfers Assistance Needed: Ambulates around 20 feet with SPC or SW; uses manual w/c (self propels) in community/outside           Hand Dominance        Extremity/Trunk Assessment   Upper Extremity Assessment Upper Extremity Assessment: Generalized weakness    Lower Extremity Assessment Lower Extremity Assessment: Generalized weakness    Cervical / Trunk Assessment Cervical / Trunk Assessment: (forward head/shoulders)  Communication   Communication: No difficulties  Cognition Arousal/Alertness: Awake/alert Behavior During Therapy: WFL for tasks assessed/performed Overall Cognitive Status: Within Functional Limits for tasks assessed                                        General Comments   Nursing cleared pt for participation in physical therapy.  Pt agreeable to PT session.    Exercises  Transfer training   Assessment/Plan    PT Assessment Patient needs continued PT services  PT Problem List Decreased strength;Decreased activity tolerance;Decreased balance;Decreased mobility;Decreased knowledge of use of DME       PT Treatment Interventions DME instruction;Gait training;Stair training;Functional mobility training;Therapeutic activities;Therapeutic exercise;Balance training;Patient/family education    PT Goals (Current goals can be found in the Care Plan section)  Acute Rehab PT Goals Patient Stated Goal: to get stronger and be able to walk PT Goal Formulation: With patient Time For Goal Achievement: 11/14/18 Potential to Achieve Goals: Good    Frequency Min 2X/week   Barriers to discharge Decreased caregiver support      Co-evaluation               AM-PAC PT "6 Clicks" Mobility  Outcome Measure Help needed turning from your back to your side while in a flat bed  without using bedrails?: A Lot Help needed moving from lying on your back to sitting on the side of a flat bed without using bedrails?: A Lot Help needed moving to and from a bed to a chair (including a wheelchair)?: A Lot Help needed standing up from a chair using your arms (e.g., wheelchair or bedside chair)?: A Lot Help needed to walk in hospital room?: Total Help needed climbing 3-5 steps with a railing? : Total 6 Click Score: 10    End of Session Equipment Utilized During Treatment: Gait belt Activity Tolerance: Patient limited by fatigue Patient left: in chair;with call bell/phone within reach;with chair alarm set;Other (comment)(B LE's elevated via pillow) Nurse Communication: Mobility status;Precautions PT Visit Diagnosis: Other abnormalities of gait and mobility (R26.89);Muscle weakness (generalized) (M62.81);History of falling (Z91.81);Difficulty in walking, not elsewhere classified (R26.2)    Time: 7824-2353 PT Time Calculation (min) (ACUTE ONLY): 34 min   Charges:   PT Evaluation $PT Eval Low Complexity: 1 Low PT Treatments $Therapeutic Activity: 8-22 mins        Leitha Bleak, PT 10/31/18, 12:32 PM [phone]

## 2018-10-31 NOTE — Progress Notes (Addendum)
Husband called front desk irate stating he hasnt spoke to MD today. Phone number was updated in epic. Charge nurse Quillian Quince informed MD Manuella Ghazi of updated number. MD Manuella Ghazi has called husband four times between [phone] and [phone]. No answer each time. Husband does not have voice mail set up, so MD Manuella Ghazi is unable to leave voice mail. Informed charge nurse and patient of MD Shah's attempts to contact husband.

## 2018-11-01 ENCOUNTER — Encounter: Payer: Self-pay | Admitting: Anesthesiology

## 2018-11-01 ENCOUNTER — Inpatient Hospital Stay: Payer: Medicaid Other | Admitting: Anesthesiology

## 2018-11-01 ENCOUNTER — Encounter
Admission: EM | Disposition: A | Discharge status: Skilled Nursing Facility | Payer: Medicaid Other | Source: Home / Self Care | Attending: Internal Medicine

## 2018-11-01 HISTORY — PX: ENTEROSCOPY: SHX5533

## 2018-11-01 LAB — GLUCOSE, CAPILLARY
Glucose-Capillary: 121 mg/dL — ABNORMAL HIGH (ref 70–99)
Glucose-Capillary: 108 mg/dL — ABNORMAL HIGH (ref 70–99)
Glucose-Capillary: 159 mg/dL — ABNORMAL HIGH (ref 70–99)

## 2018-11-01 LAB — CBC
HCT: 24.3 % — ABNORMAL LOW (ref 36.0–46.0)
Hemoglobin: 7.1 g/dL — ABNORMAL LOW (ref 12.0–15.0)
MCH: 23.9 pg — ABNORMAL LOW (ref 26.0–34.0)
MCHC: 29.2 g/dL — ABNORMAL LOW (ref 30.0–36.0)
MCV: 81.8 fL (ref 80.0–100.0)
Platelets: 167 10*3/uL (ref 150–400)
RBC: 2.97 MIL/uL — ABNORMAL LOW (ref 3.87–5.11)
RDW: 23.8 % — ABNORMAL HIGH (ref 11.5–15.5)
WBC: 6.8 10*3/uL (ref 4.0–10.5)
nRBC: 0 % (ref 0.0–0.2)

## 2018-11-01 LAB — RETICULOCYTES
Immature Retic Fract: 34.5 % — ABNORMAL HIGH (ref 2.3–15.9)
RBC.: 2.89 MIL/uL — ABNORMAL LOW (ref 3.87–5.11)
Retic Count, Absolute: 82.7 10*3/uL (ref 19.0–186.0)
Retic Ct Pct: 2.9 % (ref 0.4–3.1)

## 2018-11-01 LAB — HEMOGLOBIN AND HEMATOCRIT, BLOOD
HCT: 31.1 % — ABNORMAL LOW (ref 36.0–46.0)
Hemoglobin: 9 g/dL — ABNORMAL LOW (ref 12.0–15.0)

## 2018-11-01 LAB — BASIC METABOLIC PANEL
Anion gap: 11 (ref 5–15)
BUN: 42 mg/dL — ABNORMAL HIGH (ref 8–23)
CO2: 21 mmol/L — ABNORMAL LOW (ref 22–32)
Calcium: 8.2 mg/dL — ABNORMAL LOW (ref 8.9–10.3)
Chloride: 108 mmol/L (ref 98–111)
Creatinine, Ser: 2.78 mg/dL — ABNORMAL HIGH (ref 0.44–1.00)
GFR calc Af Amer: 20 mL/min — ABNORMAL LOW (ref >60)
GFR calc non Af Amer: 17 mL/min — ABNORMAL LOW (ref >60)
Glucose, Bld: 138 mg/dL — ABNORMAL HIGH (ref 70–99)
Potassium: 4.8 mmol/L (ref 3.5–5.1)
Sodium: 140 mmol/L (ref 135–145)

## 2018-11-01 LAB — IRON AND TIBC
Iron: 15 ug/dL — ABNORMAL LOW (ref 28–170)
Saturation Ratios: 7 % — ABNORMAL LOW (ref 10.4–31.8)
TIBC: 213 ug/dL — ABNORMAL LOW (ref 250–450)
UIBC: 198 ug/dL

## 2018-11-01 LAB — VITAMIN B12: Vitamin B-12: 933 pg/mL — ABNORMAL HIGH (ref 180–914)

## 2018-11-01 LAB — FERRITIN: Ferritin: 95 ng/mL (ref 11–307)

## 2018-11-01 LAB — PREPARE RBC (CROSSMATCH)

## 2018-11-01 LAB — FOLATE: Folate: 12.6 ng/mL (ref >5.9)

## 2018-11-01 SURGERY — ENTEROSCOPY
Anesthesia: General

## 2018-11-01 MED ORDER — LIDOCAINE HCL (PF) 2 % IJ SOLN: INTRAMUSCULAR | Status: DC | PRN

## 2018-11-01 MED ORDER — LIDOCAINE HCL (PF) 2 % IJ SOLN
INTRAMUSCULAR | Status: AC
  Filled 2018-11-01: qty 10

## 2018-11-01 MED ORDER — LIDOCAINE HCL (CARDIAC) PF 100 MG/5ML IV SOSY
PREFILLED_SYRINGE | INTRAVENOUS | Status: DC | PRN
  Administered 2018-11-01: 60 mg via INTRAVENOUS

## 2018-11-01 MED ORDER — SODIUM CHLORIDE 0.9% IV SOLUTION
Freq: Once | INTRAVENOUS | Status: AC
  Administered 2018-11-01: 09:00:00 via INTRAVENOUS

## 2018-11-01 MED ORDER — FENTANYL CITRATE (PF) 100 MCG/2ML IJ SOLN
INTRAMUSCULAR | Status: DC | PRN
  Administered 2018-11-01: 25 ug via INTRAVENOUS

## 2018-11-01 MED ORDER — LIDOCAINE HCL (CARDIAC) PF 50 MG/5ML IV SOSY
PREFILLED_SYRINGE | INTRAVENOUS | Status: DC | PRN
  Administered 2018-11-01: 80 mg via INTRAVENOUS

## 2018-11-01 MED ORDER — SODIUM CHLORIDE 0.9 % IV SOLN
INTRAVENOUS | Status: DC
  Administered 2018-11-01: 1000 mL via INTRAVENOUS

## 2018-11-01 MED ORDER — PHENYLEPHRINE HCL (PRESSORS) 10 MG/ML IV SOLN
INTRAVENOUS | Status: DC | PRN
  Administered 2018-11-01 (×4): 200 ug via INTRAVENOUS
  Administered 2018-11-01 (×2): 100 ug via INTRAVENOUS

## 2018-11-01 MED ORDER — FENTANYL CITRATE (PF) 100 MCG/2ML IJ SOLN
INTRAMUSCULAR | Status: AC
  Filled 2018-11-01: qty 2

## 2018-11-01 MED ORDER — SODIUM CHLORIDE 0.9 % IV SOLN
200.0000 mg | Freq: Once | INTRAVENOUS | Status: AC
  Administered 2018-11-01: 17:00:00 200 mg via INTRAVENOUS
  Filled 2018-11-01: qty 10

## 2018-11-01 MED ORDER — PROPOFOL 500 MG/50ML IV EMUL
INTRAVENOUS | Status: DC | PRN
  Administered 2018-11-01: 150 ug/kg/min via INTRAVENOUS

## 2018-11-01 MED ORDER — PHENYLEPHRINE HCL (PRESSORS) 10 MG/ML IV SOLN: INTRAVENOUS | Status: DC | PRN

## 2018-11-01 MED ORDER — PROPOFOL 10 MG/ML IV BOLUS: INTRAVENOUS | Status: DC | PRN

## 2018-11-01 NOTE — Consult Note (Addendum)
[place] Nurse wound consult note Patient receiving care in [facility].  Consult is being completed remotely. Reason for Consult: "wound to posterior LLE".  I have sent a SecureChat to Dr. Carlynn Spry:  "Good morning, I would like to assist with the [facility] consult for a wound to the LLE. There are multiple photos in the record (Thank you for those!), but I don't see anything anywhere about a wound on the LLE. Would you be willing to place one in the record? I am not on site today and having a photo would be extremely helpful. Thank you, [facility] nurse"  Wound type: From photos, the left posterior upper thigh area has improved.  Likely related to Moisture Associated Skin Damage from urinary incontinence. Wound bed: pink Drainage (amount, consistency, odor) none Periwound: intact Dressing procedure/placement/frequency: Continue the use of a foam dressing to the area on the Left posterior leg. Change every 3 days and prn.  I did verify with primary RN, Melissa, via telephone that this is the area that the [facility] was consulted on. Monitor the wound area(s) for worsening of condition such as: Signs/symptoms of infection,  Increase in size,  Development of or worsening of odor, Development of pain, or increased pain at the affected locations.  Notify the medical team if any of these develop.  Thank you for the consult. [facility] nurse will not follow at this time.  Please re-consult the [place] team if needed.  Val Riles, RN, MSN, CWOCN, CNS-BC, pager [phone]

## 2018-11-01 NOTE — Progress Notes (Signed)
Held Midodrine BP 150/92, been running on the high side today notified Dr Manuella Ghazi. See new orders

## 2018-11-01 NOTE — Anesthesia Preprocedure Evaluation (Signed)
Anesthesia Evaluation  Patient identified by MRN, date of birth, ID band Patient awake    Reviewed: Allergy & Precautions, H&P , NPO status , Patient's Chart, lab work & pertinent test results  History of Anesthesia Complications Negative for: history of anesthetic complications  Airway Mallampati: III  TM Distance: >3 FB Neck ROM: limited    Dental  (+) Chipped, Poor Dentition, Missing   Pulmonary neg shortness of breath, sleep apnea , pneumonia, COPD (has home O2, says she no longer uses it, she is not sure if she still needs it or not),  oxygen dependent, Current Smoker,           Cardiovascular Exercise Tolerance: Good hypertension, (-) angina+ CAD, + Past MI (years ago) and +CHF  (-) DOE + Valvular Problems/Murmurs (AV bioprosthesis on echo.  Pt doesn't know anything about this. Not on anticoagulation)   Echo 2019: - Left ventricle: The cavity size was normal. There was moderate   concentric hypertrophy. Systolic function was mildly to   moderately reduced. The estimated ejection fraction was in the   range of 40% to 45%. Diffuse hypokinesis. - Aortic valve: A bioprosthesis was present. - Mitral valve: Calcified annulus. Mildly thickened leaflets .   There was moderate regurgitation. - Left atrium: The appendage was moderately dilated. - Right atrium: The appendage was moderately dilated. - Tricuspid valve: There was moderate regurgitation.   Neuro/Psych PSYCHIATRIC DISORDERS  Neuromuscular disease    GI/Hepatic PUD, neg GERD  ,(+) Hepatitis -  Endo/Other  diabetes, Type 2, Insulin Dependent  Renal/GU Renal disease  negative genitourinary   Musculoskeletal   Abdominal   Peds  Hematology  (+) Blood dyscrasia, anemia , Chronic anemia, presented with severe anemia Hgb <4, Hgb currently 7.5 s/p PRBC x 3 units, currently receiving PRBC.  Denies CP, SOB, lightheadedness.  VSS   Anesthesia Other Findings Patient is  NPO appropriate and reports no nausea or vomiting today.  Past Medical History: No date: Cervical cancer (HCC) No date: CHF (congestive heart failure) (HCC) No date: COPD (chronic obstructive pulmonary disease) (HCC) No date: Diabetes mellitus without complication (HCC) No date: NSTEMI (non-ST elevated myocardial infarction) ([place])  Past Surgical History: No date: APPENDECTOMY No date: CARDIAC CATHETERIZATION No date: CHOLECYSTECTOMY 09/28/2017: COLONOSCOPY; N/A     Comment:  Procedure: COLONOSCOPY;  Surgeon: Toledo, Benay Pike, MD;              Location: [facility] ENDOSCOPY;  Service: Gastroenterology;                Laterality: N/A; 11/06/2017: ENTEROSCOPY; Left     Comment:  Procedure: ENTEROSCOPY;  Surgeon: Jonathon Bellows, MD;                Location: [facility] ENDOSCOPY;  Service: Gastroenterology;                Laterality: Left; 12/19/2016: ESOPHAGOGASTRODUODENOSCOPY; N/A     Comment:  Procedure: ESOPHAGOGASTRODUODENOSCOPY (EGD);  Surgeon:               Lin Landsman, MD;  Location: [facility] ENDOSCOPY;                Service: Gastroenterology;  Laterality: N/A; 09/28/2017: ESOPHAGOGASTRODUODENOSCOPY; N/A     Comment:  Procedure: ESOPHAGOGASTRODUODENOSCOPY (EGD);  Surgeon:               Toledo, Benay Pike, MD;  Location: [facility] ENDOSCOPY;                Service:  Gastroenterology;  Laterality: N/A; 08/08/2017: ESOPHAGOGASTRODUODENOSCOPY (EGD) WITH PROPOFOL; N/A     Comment:  Procedure: ESOPHAGOGASTRODUODENOSCOPY (EGD) WITH               PROPOFOL;  Surgeon: [name], MD;  Location: [facility]               ENDOSCOPY;  Service: Endoscopy;  Laterality: N/A; No date: heart surgery in [place] 12/23/2016: LEFT HEART CATH AND CORONARY ANGIOGRAPHY; N/A     Comment:  Procedure: LEFT HEART CATH AND CORONARY ANGIOGRAPHY;                Surgeon: Teodoro Spray, MD;  Location: [facility] CV              LAB;  Service: Cardiovascular;  Laterality: N/A;  BMI    Body Mass Index:  27.46 kg/m       Reproductive/Obstetrics negative OB ROS                             Anesthesia Physical  Anesthesia Plan  ASA: III  Anesthesia Plan: General LMA   Post-op Pain Management:    Induction: Intravenous  PONV Risk Score and Plan: Dexamethasone, Ondansetron, Midazolam and Treatment may vary due to age or medical condition  Airway Management Planned: Nasal Cannula and Natural Airway  Additional Equipment:   Intra-op Plan:   Post-operative Plan: Extubation in OR  Informed Consent: I have reviewed the patients History and Physical, chart, labs and discussed the procedure including the risks, benefits and alternatives for the proposed anesthesia with the patient or authorized representative who has indicated his/her understanding and acceptance.     Dental Advisory Given  Plan Discussed with: Anesthesiologist and CRNA  Anesthesia Plan Comments: (Patient consented for risks of anesthesia including but not limited to:  - adverse reactions to medications - damage to teeth, lips or other oral mucosa - sore throat or hoarseness - Damage to heart, brain, lungs or loss of life  Patient voiced understanding.)        Anesthesia Quick Evaluation

## 2018-11-01 NOTE — Progress Notes (Addendum)
[facility] [place] NAME: [name]    MR#:  [id number]  DATE OF BIRTH:  [birth year]  SUBJECTIVE:  Waiting for endoscopy, hemoglobin dropped to 7.1 REVIEW OF SYSTEMS:  CONSTITUTIONAL: No fever, reports fatigue or weakness.  EYES: No blurred or double vision.  EARS, NOSE, AND THROAT: No tinnitus or ear pain.  RESPIRATORY: No cough, shortness of breath, wheezing or hemoptysis.  CARDIOVASCULAR: No chest pain, orthopnea, edema.  GASTROINTESTINAL: No nausea, vomiting, diarrhea or abdominal pain.  GENITOURINARY: No dysuria, hematuria.  ENDOCRINE: No polyuria, nocturia,  HEMATOLOGY: No anemia, easy bruising or bleeding SKIN: No rash or lesion. MUSCULOSKELETAL: No joint pain or arthritis.   NEUROLOGIC: No tingling, numbness, weakness.  PSYCHIATRY: No anxiety or depression.  DRUG ALLERGIES:   Allergies  Allergen Reactions  . Latex Anaphylaxis and Rash  . Gabapentin Nausea And Vomiting and Other (See Comments)    VITALS:  Blood pressure 122/72, pulse 84, temperature 98.1 F (36.7 C), temperature source Oral, resp. rate 20, height 5' 8"  (1.727 m), weight 123.1 kg, SpO2 95 %. PHYSICAL EXAMINATION:  GENERAL:  63 y.o.-year-old patient lying in the bed with no acute distress.  EYES: Pupils equal, round, reactive to light and accommodation. No scleral icterus. Extraocular muscles intact.  HEENT: Head atraumatic, normocephalic. Oropharynx and nasopharynx clear.  NECK:  Supple, no jugular venous distention. No thyroid enlargement, no tenderness.  LUNGS: Normal breath sounds bilaterally, no wheezing, rales,rhonchi or crepitation. No use of accessory muscles of respiration.  CARDIOVASCULAR: S1, S2 normal. No murmurs, rubs, or gallops.  ABDOMEN: Soft, nontender, nondistended. Bowel sounds present.  EXTREMITIES: No pedal edema, cyanosis, or clubbing.  NEUROLOGIC: Patient is arousable oriented x3 sensation intact. Gait not checked.   PSYCHIATRIC: The patient is alert and oriented x3.  SKIN: No obvious rash, lesion, or ulcer.  LABORATORY PANEL:   CBC Recent Labs  Lab 11/01/18 0546  WBC 6.8  HGB 7.1*  HCT 24.3*  PLT 167   ------------------------------------------------------------------------------------------------------------------  Chemistries  Recent Labs  Lab 10/27/18 0350  11/01/18 0546  NA 135   < > 140  K 4.8   < > 4.8  CL 109   < > 108  CO2 19*   < > 21*  GLUCOSE 98   < > 138*  BUN 33*   < > 42*  CREATININE 2.95*   < > 2.78*  CALCIUM 7.9*   < > 8.2*  AST 17  --   --   ALT 9  --   --   ALKPHOS 74  --   --   BILITOT 2.5*  --   --    < > = values in this interval not displayed.   ------------------------------------------------------------------------------------------------------------------  Cardiac Enzymes No results for input(s): TROPONINI in the last 168 hours. ------------------------------------------------------------------------------------------------------------------  RADIOLOGY:  Dg Abd 1 View  Result Date: 10/31/2018 CLINICAL DATA:  Ileus EXAM: ABDOMEN - 1 VIEW COMPARISON:  Yesterday FINDINGS: No dilated bowel. Moderate stool intermittently seen along the colon. No concerning mass effect or calcification. High-density material over the pelvis was not seen on abdominal CT from 4 days ago and has progressed by radiography. IMPRESSION: 1. Nonobstructive bowel gas pattern. 2. Moderate stool volume. 3. A high-density structure is progressing through the colon, now at the rectum. Is there a missing dental cap? Electronically Signed   By: Monte Fantasia M.D.   On: 10/31/2018 05:36   Dg Chest Port 1 View  Result Date: 10/31/2018 CLINICAL  DATA:  Acute respiratory failure EXAM: PORTABLE CHEST 1 VIEW COMPARISON:  Yesterday FINDINGS: Chronic cardiomegaly. 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 Vascular congestion. Mild reticulation in the left mid lung, likely scarring. There  is no edema, consolidation, effusion, or pneumothorax. IMPRESSION: Cardiomegaly and vascular congestion. Electronically Signed   By: Monte Fantasia M.D.   On: 10/31/2018 05:34    EKG:   Orders placed or performed during the hospital encounter of 10/25/18  . EKG 12-Lead  . EKG 12-Lead  . EKG 12-Lead  . EKG 12-Lead  . EKG 12-Lead  . EKG 12-Lead  . EKG 12-Lead  . EKG 12-Lead    ASSESSMENT AND PLAN:  63 y.o. 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 Delana Meyer, and CKD to the ED with worsening shortness of breath and fall.  1. Sepsis with multiorgan failure -sepsis present on admission - chest x-ray shows no active cardiopulmonary disease - Covid negative - CT abdomen and pelvis concerning for acute pyelonephritis -right more than the left perinephric stranding - Blood cultures strep group C in 4 out of 4 bottles - continue ampicillin for 2 weeks per ID, per ID we may be able to switch her to oral antibiotic at discharge - TEE neg for any valve vegetations  2.  Acute on chronic iron deficiency anemia -secondary to chronic blood loss from gastric and small bowel AVMs -s/p EGD (06/22/18) with oozing gastric ulcers treated with bipolar cautery, colonoscopy, capsule endoscopy as well as small bowel enteroscopy (06/2018) - s/p 2 unit blood transfusion - Hb 8.1->7.1.  Order 1 more unit of red blood cell transfusion - Pantoprazole IV - s/p capsule study by GI showing no active bleeding. 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  Status post push enteroscopy showing no active bleeding, GI recommends IV iron for now -We will get anemia panel - Onco seen - unlikely hemolysis per Dr Mike Gip  3. Elevated troponin -due to demand ischemia  4. DM: sugars have  been well-controlled on current regimen  - hemoglobin A1c 1 6/5 was 5.9 - sliding scale insulin coverage in addition to Lantus, cut back on dose of lantus as sugars running low - ADA 2100 calorie diet    5. Chronic congestive heart failure with reduced EF - stable Echocardiogram with moderate LVH and EF at 40 to45% and diffuse hypokinesis noted. Patient has had bioprosthesis for aortic valve - lasix 40 mg IV bid, -5 L fluid balance - Low salt diet   6. COPD -noevidence of exacerbation.  - Supplemental O2, goal sat 88-92% - Duonebs Q 6 hours - Continue home inhalers  7. Acute on chronic Kidney Disease 3 -improving creat 3.54-> 3.0 - Hold nephrotoxins, Nephrology following - Continue to monitor renal function - reduced Lyrica dose  8.  Anasarca - on Lasix 40 mg IV bid now, neg 5 L  9.weakness-PT recommends rehab, I did have a talk with her husband who was in agreement with same  10. Hyperkalemia: Resolved    Transferred back to the floors last evening.  Doing better   All the records are reviewed and case discussed with Care Management/Social Worker. Management plans discussed with the patient, nursing and they are in agreement.  CODE STATUS: Full code  TOTAL TIME TAKING CARE OF THIS PATIENT: 35 minutes.   POSSIBLE  D/C IN 1-2 DAYS, DEPENDING ON CLINICAL CONDITION.  Note: This dictation was prepared with Dragon dictation along with smaller phrase technology. Any transcriptional errors that result from this process are unintentional.   Max Sane M.D on 11/01/2018 at 1:26 PM  Between 7am to 6pm - Pager - [phone] After 6pm go to [email or web address] - password [email or web address] [facility]  Office  [phone]  CC: Primary care physician; [place]

## 2018-11-01 NOTE — Progress Notes (Signed)
Cancellation Note  Pt currently off floor for procedure.  Will re-attempt PT treatment session at a later date/time as medically appropriate.  Leitha Bleak, PT 11/01/18, 10:09 AM [phone]

## 2018-11-01 NOTE — Op Note (Signed)
[facility] Gastroenterology Patient Name: Barbarann Kelly Procedure Date: 11/01/2018 10:35 AM MRN: [id number] Account #: [id number] Date of Birth: [birth year] Admit Type: Inpatient Age: 63 Room: [facility] ENDO ROOM 1 Gender: Female Note Status: Finalized Procedure:            Small bowel enteroscopy Providers:            Varnita B. Tahiliani MD, MD Complications:        No immediate complications. Procedure:            Pre-Anesthesia Assessment:                       - Prior to the procedure, a History and Physical was                        performed, and patient medications and allergies were                        reviewed. The patient is competent. The risks and                        benefits of the procedure and the sedation options and                        risks were discussed with the patient. All questions                        were answered and informed consent was obtained.                        Patient identification and proposed procedure were                        verified by the physician, the nurse, the                        anesthesiologist, the anesthetist and the technician in                        the pre-procedure area in the procedure room in the                        endoscopy suite. Mental Status Examination: alert and                        oriented. Airway Examination: normal oropharyngeal                        airway and neck mobility. Respiratory Examination:                        clear to auscultation. CV Examination: normal.                        Prophylactic Antibiotics: The patient does not require                        prophylactic antibiotics. Prior Anticoagulants: The                        patient  has taken no previous anticoagulant or                        antiplatelet agents. ASA Grade Assessment: III - A                        patient with severe systemic disease. After reviewing                        the risks and  benefits, the patient was deemed in                        satisfactory condition to undergo the procedure. The                        anesthesia plan was to use general anesthesia.                        Immediately prior to administration of medications, the                        patient was re-assessed for adequacy to receive                        sedatives. The heart rate, respiratory rate, oxygen                        saturations, blood pressure, adequacy of pulmonary                        ventilation, and response to care were monitored                        throughout the procedure. The physical status of the                        patient was re-assessed after the procedure.                       After obtaining informed consent, the endoscope was                        passed under direct vision. Throughout the procedure,                        the patient's blood pressure, pulse, and oxygen                        saturations were monitored continuously. The                        Colonoscope was introduced through the mouth and                        advanced to the jejunum, to the 160 cm mark (from the                        incisors). The small bowel enteroscopy was aborted due  to the patient's respiratory instability (hypoxia). Findings:      The scope was advanced to the intended extent in the jejunum. The       majority of the jejunum was examined on withdrawal and appeared normal.       During withdrawal patient became hypoxic and anesthesia recommended       removing the scope due stabilize the patient. Hypoxia improved with       their interventions. A decision was made not to reintroduce the scope to       prevent further hypoxia, as no signs of active bleeding were seen       throughout the exam.      A careful exam of the more proximal small bowel, to find the       non-bleeding AVM seen on capsule study, could not be done due to the        above.      The stomach was normal without any ulcers or bleeding or erosions seen.      Brief visualization of the esophagus when the scope was introduced was       normal. However, it could not be examined carefully on withdrawal as is       usually done due to the above. Impression:           - The procedure was aborted due to the patient's                        respiratory instability (hypoxia).                       - No specimens collected.                       - No evidence of active or recent bleeding seen                        throughout the exam.                       - This exam was limited in identifying small lesions or                        AVM. However, the exam was sufficient to confirm the                        absence of any active bleedings in the esophagus,                        stomach, duodenum or jejunum. Recommendation:       - Continue Serial CBCs and transfuse PRN                       - Due to the absence of any clinical signs of active GI                        bleeding and no active bleeding on endoscopy today,                        would not recommend a repeat exam at this time. Pt  should follow up in GI clinic closely and can be                        reassessed at a later time, when she is better                        medically optimized, to consider upper endoscopy or                        enteroscopy to find and treat the non bleeding AVM seen                        on capsule study this admission.                       - Advance diet as tolerated.                       - The findings and recommendations were discussed with                        the patient. Procedure Code(s):    --- Professional ---                       [phone], Small intestinal endoscopy, enteroscopy beyond                        second portion of duodenum, not including ileum;                        diagnostic, including collection of specimen(s) by                         brushing or washing, when performed (separate procedure) Diagnosis Code(s):    --- Professional ---                       Z53.09, Procedure and treatment not carried out because                        of other contraindication CPT copyright 2019 [employer] Medical Association. All rights reserved. The codes documented in this report are preliminary and upon coder review may  be revised to meet current compliance requirements.  [name], MD Margretta Sidle B. Bonna Gains MD, MD 11/01/2018 11:29:15 AM This report has been signed electronically. Number of Addenda: 0 Note Initiated On: 11/01/2018 10:35 AM      [facility]

## 2018-11-01 NOTE — Anesthesia Post-op Follow-up Note (Signed)
Anesthesia QCDR form completed.        

## 2018-11-01 NOTE — Anesthesia Postprocedure Evaluation (Signed)
Anesthesia Post Note  Patient: [name]  Procedure(s) Performed: ENTEROSCOPY (N/A )  Patient location during evaluation: Endoscopy Anesthesia Type: General Level of consciousness: awake and alert Pain management: pain level controlled Vital Signs Assessment: post-procedure vital signs reviewed and stable Respiratory status: spontaneous breathing, nonlabored ventilation, respiratory function stable and patient connected to nasal cannula oxygen Cardiovascular status: blood pressure returned to baseline and stable Postop Assessment: no apparent nausea or vomiting Comments: Patient was slow to wake up.  She was somewhat sleepy before the case.  Now she is grossly neurologically intact and answer questions.     Last Vitals:  Vitals:   11/01/18 1142 11/01/18 1152  BP: (!) 112/54 110/64  Pulse: 86 86  Resp: 14 13  Temp:    SpO2: 100% 100%    Last Pain:  Vitals:   11/01/18 1152  TempSrc:   PainSc: Asleep                 [name]

## 2018-11-01 NOTE — Progress Notes (Signed)
Called ENDO to inform them that the unit of RBC was ready and offered to bring down. [name] declined stated patient would be back on the unit soon.

## 2018-11-01 NOTE — TOC Progression Note (Signed)
Transition of Care [facility]) - Progression Note    Patient Details  Name: [name] MRN: [id number] Date of Birth: 1955/07/23  [facility]) CM/SW [place], LCSW Phone Number: 11/01/2018, 2:27 PM  Clinical Narrative: [facility] does not have any female beds. No other bed offers. CSW expanded bed search to other counties.    Expected Discharge Plan: Skilled Nursing Facility Barriers to Discharge: SNF Pending bed offer, Continued Medical Work up  Expected Discharge Plan and Services Expected Discharge Plan: [place] arrangements for the past 2 months: Single Family Home                                       Social Determinants of Health (SDOH) Interventions    Readmission Risk Interventions Readmission Risk Prevention Plan 10/26/2018 08/21/2018 09/30/2017  Transportation Screening Complete Complete Complete  PCP or Specialist Appt within 5-7 Days - - Complete  Home Care Screening - - Complete  Medication Review (RN CM) - - Complete  HRI or Home Care Consult Complete Complete -  Social Work Consult for [place] Planning/Counseling Not Complete - -  SW consult not completed comments not recovery care - -  Palliative Care Screening Not Applicable Not Applicable -  Medication Review (RN Care Manager) Referral to Pharmacy Complete -  [place] or Home Care Consult Complete - -  Palliative Care Screening Not Applicable - -  Some recent data might be hidden

## 2018-11-01 NOTE — Progress Notes (Signed)
Updated husband this am. Phone # listed in computer was wrong. Correct # is [phone]  He is satisfied with the care and appreciative.

## 2018-11-01 NOTE — Progress Notes (Signed)
OT Cancellation Note  Patient Details Name: [name] MRN: [id number] DOB: 14-Mar-1955   Cancelled Treatment:    Reason Eval/Treat Not Completed: Other (comment). Chart reviewed. Pt currently receiving blood transfusion. Will re-attempt OT tx at later date/time as pt is medically appropriate.   Jeni Salles, MPH, MS, OTR/L ascom [phone] 11/01/18, 3:40 PM

## 2018-11-01 NOTE — Transfer of Care (Signed)
Immediate Anesthesia Transfer of Care Note  Patient: [name]  Procedure(s) Performed: ENTEROSCOPY (N/A )  Patient Location: PACU  Anesthesia Type:General  Level of Consciousness: sedated and unresponsive  Airway & Oxygen Therapy: Patient Spontanous Breathing and Patient connected to nasal cannula oxygen  Post-op Assessment: Report given to RN and Post -op Vital signs reviewed and stable  Post vital signs: Reviewed and stable  Last Vitals:  Vitals Value Taken Time  BP 112/54 11/01/18 1142  Temp    Pulse 84 11/01/18 1146  Resp 13 11/01/18 1146  SpO2 99 % 11/01/18 1146  Vitals shown include unvalidated device data.  Last Pain:  Vitals:   11/01/18 1142  TempSrc:   PainSc: Asleep      Patients Stated Pain Goal: 3 (65/99/78 [id number])  Complications: No apparent anesthesia complications

## 2018-11-01 NOTE — Progress Notes (Signed)
Tanya Antigua, MD [address] [place], [place], [place], [address] [address], [place], [place], [place], [address] Phone: [phone]  Fax: [phone]   Subjective: No active GI bleeding. No abdominal pain   Objective: Exam: Vital signs in last 24 hours: Vitals:   10/31/18 1946 11/01/18 0403 11/01/18 0722 11/01/18 0934  BP: (!) 127/54 (!) 128/57 132/67 138/66  Pulse: 96 98 96 92  Resp: (!) 24 20 19 20   Temp: 98.6 F (37 C) 99.2 F (37.3 C) 98.6 F (37 C) (!) 97.1 F (36.2 C)  TempSrc: Oral Oral Oral Tympanic  SpO2: 99% 96% 100% 99%  Weight:      Height:       Weight change:   Intake/Output Summary (Last 24 hours) at 11/01/2018 1017 Last data filed at 11/01/2018 0403 Gross per 24 hour  Intake 1127.99 ml  Output 5650 ml  Net -4522.01 ml    General: No acute distress, AAO x3 Abd: Soft, NT/ND, No HSM Skin: Warm, no rashes Neck: Supple, Trachea midline   Lab Results: Lab Results  Component Value Date   WBC 6.8 11/01/2018   HGB 7.1 (L) 11/01/2018   HCT 24.3 (L) 11/01/2018   MCV 81.8 11/01/2018   PLT 167 11/01/2018   Micro Results: Recent Results (from the past 240 hour(s))  Blood culture (routine x 2)     Status: Abnormal   Collection Time: 10/25/18  8:38 PM   Specimen: BLOOD  Result Value Ref Range Status   Specimen Description   Final    BLOOD LEFT ANTECUBITAL Performed at [facility], [address] [place], [place] [address]    Special Requests   Final    BOTTLES DRAWN AEROBIC AND ANAEROBIC Blood Culture results may not be optimal due to an excessive volume of blood received in culture bottles Performed at [facility], [address] [place], [place] [address]    Culture  Setup Time   Final    GRAM POSITIVE COCCI IN BOTH AEROBIC AND ANAEROBIC BOTTLES CRITICAL RESULT CALLED TO, READ BACK BY AND VERIFIED WITH: CHARLES SHANLEVER @0835  ON 10/26/2018 BY FMW Performed at [facility], [address], [place], [place] [address]    Culture STREPTOCOCCUS GROUP C (A)  Final   Report Status 10/28/2018 FINAL  Final   Organism ID, Bacteria STREPTOCOCCUS GROUP C  Final      Susceptibility   Streptococcus group c - MIC*    CLINDAMYCIN <=0.25 SENSITIVE Sensitive     AMPICILLIN <=0.25 SENSITIVE Sensitive     ERYTHROMYCIN <=0.12 SENSITIVE Sensitive     VANCOMYCIN 0.5 SENSITIVE Sensitive     CEFTRIAXONE <=0.12 SENSITIVE Sensitive     LEVOFLOXACIN 0.5 SENSITIVE Sensitive     PENICILLIN Value in next row Sensitive      SENSITIVE<=0.06    * STREPTOCOCCUS GROUP C  Blood culture (routine x 2)     Status: Abnormal   Collection Time: 10/25/18  8:38 PM   Specimen: BLOOD  Result Value Ref Range Status   Specimen Description   Final    BLOOD RIGHT ANTECUBITAL Performed at [facility], [address] [place], [place] [address]    Special Requests   Final    BOTTLES DRAWN AEROBIC AND ANAEROBIC Blood Culture results may not be optimal due to an excessive volume of blood received in culture bottles Performed at [facility], [address], [place], [place] [address]    Culture  Setup Time  Final    GRAM POSITIVE COCCI IN BOTH AEROBIC AND ANAEROBIC BOTTLES CRITICAL VALUE NOTED.  VALUE IS CONSISTENT WITH PREVIOUSLY REPORTED AND CALLED VALUE. Performed at [facility], [address] [place], [place] [address]    Culture (A)  Final    STREPTOCOCCUS GROUP C SUSCEPTIBILITIES PERFORMED ON PREVIOUS CULTURE WITHIN THE LAST 5 DAYS. Performed at [facility], [place] [address], [place], [place] [address]    Report Status 10/28/2018 FINAL  Final  Blood Culture ID Panel (Reflexed)     Status: Abnormal   Collection Time: 10/25/18  8:38 PM  Result Value Ref Range Status   Enterococcus species NOT DETECTED NOT DETECTED Final   Listeria monocytogenes NOT DETECTED NOT DETECTED Final   Staphylococcus species NOT DETECTED NOT DETECTED Final   Staphylococcus aureus (BCID) NOT  DETECTED NOT DETECTED Final   Streptococcus species DETECTED (A) NOT DETECTED Final    Comment: Not Enterococcus species, Streptococcus agalactiae, Streptococcus pyogenes, or Streptococcus pneumoniae. CRITICAL RESULT CALLED TO, READ BACK BY AND VERIFIED WITH: CHARLES SHANLEVER @0835  ON 10/26/2018 BY FMW    Streptococcus agalactiae NOT DETECTED NOT DETECTED Final   Streptococcus pneumoniae NOT DETECTED NOT DETECTED Final   Streptococcus pyogenes NOT DETECTED NOT DETECTED Final   Acinetobacter baumannii NOT DETECTED NOT DETECTED Final   Enterobacteriaceae species NOT DETECTED NOT DETECTED Final   Enterobacter cloacae complex NOT DETECTED NOT DETECTED Final   Escherichia coli NOT DETECTED NOT DETECTED Final   Klebsiella oxytoca NOT DETECTED NOT DETECTED Final   Klebsiella pneumoniae NOT DETECTED NOT DETECTED Final   Proteus species NOT DETECTED NOT DETECTED Final   Serratia marcescens NOT DETECTED NOT DETECTED Final   Haemophilus influenzae NOT DETECTED NOT DETECTED Final   Neisseria meningitidis NOT DETECTED NOT DETECTED Final   Pseudomonas aeruginosa NOT DETECTED NOT DETECTED Final   Candida albicans NOT DETECTED NOT DETECTED Final   Candida glabrata NOT DETECTED NOT DETECTED Final   Candida krusei NOT DETECTED NOT DETECTED Final   Candida parapsilosis NOT DETECTED NOT DETECTED Final   Candida tropicalis NOT DETECTED NOT DETECTED Final    Comment: Performed at [facility], [address] [place], [place] [address]  SARS Coronavirus 2 by RT PCR (hospital order, performed in [place] hospital lab) Nasopharyngeal Nasopharyngeal Swab     Status: None   Collection Time: 10/25/18  8:49 PM   Specimen: Nasopharyngeal Swab  Result Value Ref Range Status   SARS Coronavirus 2 NEGATIVE NEGATIVE Final    Comment: (NOTE) If result is NEGATIVE SARS-CoV-2 target nucleic acids are NOT DETECTED. The SARS-CoV-2 RNA is generally detectable in upper and lower  respiratory specimens during  the acute phase of infection. The lowest  concentration of SARS-CoV-2 viral copies this assay can detect is 250  copies / mL. A negative result does not preclude SARS-CoV-2 infection  and should not be used as the sole basis for treatment or other  patient management decisions.  A negative result may occur with  improper specimen collection / handling, submission of specimen other  than nasopharyngeal swab, presence of viral mutation(s) within the  areas targeted by this assay, and inadequate number of viral copies  (<250 copies / mL). A negative result must be combined with clinical  observations, patient history, and epidemiological information. If result is POSITIVE SARS-CoV-2 target nucleic acids are DETECTED. The SARS-CoV-2 RNA is generally detectable in upper and lower  respiratory specimens dur ing the acute phase of infection.  Positive  results  are indicative of active infection with SARS-CoV-2.  Clinical  correlation with patient history and other diagnostic information is  necessary to determine patient infection status.  Positive results do  not rule out bacterial infection or co-infection with other viruses. If result is PRESUMPTIVE POSTIVE SARS-CoV-2 nucleic acids MAY BE PRESENT.   A presumptive positive result was obtained on the submitted specimen  and confirmed on repeat testing.  While 2019 novel coronavirus  (SARS-CoV-2) nucleic acids may be present in the submitted sample  additional confirmatory testing may be necessary for epidemiological  and / or clinical management purposes  to differentiate between  SARS-CoV-2 and other Sarbecovirus currently known to infect humans.  If clinically indicated additional testing with an alternate test  methodology [phone]) is advised. The SARS-CoV-2 RNA is generally  detectable in upper and lower respiratory sp ecimens during the acute  phase of infection. The expected result is Negative. Fact Sheet for Patients:   StrictlyIdeas.no Fact Sheet for Healthcare Providers: BankingDealers.co.za This test is not yet approved or cleared by the [place] FDA and has been authorized for detection and/or diagnosis of SARS-CoV-2 by FDA under an Emergency Use Authorization (EUA).  This EUA will remain in effect (meaning this test can be used) for the duration of the COVID-19 declaration under Section 564(b)(1) of the Act, 21 U.S.C. section 360bbb-3(b)(1), unless the authorization is terminated or revoked sooner. Performed at [facility], [address] [place], [place] [address]   MRSA PCR Screening     Status: Abnormal   Collection Time: 10/26/18 11:09 AM   Specimen: Nasopharyngeal  Result Value Ref Range Status   MRSA by PCR POSITIVE (A) NEGATIVE Final    Comment:        The GeneXpert MRSA Assay (FDA approved for NASAL specimens only), is one component of a comprehensive MRSA colonization surveillance program. It is not intended to diagnose MRSA infection nor to guide or monitor treatment for MRSA infections. RESULT CALLED TO, READ BACK BY AND VERIFIED WITH: EMILY GANNON AT 1630 10/26/2018.PMF Performed at [facility], [address] [place], [place] [address]   CULTURE, BLOOD (ROUTINE X 2) w Reflex to ID Panel     Status: None (Preliminary result)   Collection Time: 10/28/18 12:48 AM   Specimen: BLOOD  Result Value Ref Range Status   Specimen Description BLOOD RIGHT ANTECUBITAL  Final   Special Requests   Final    BOTTLES DRAWN AEROBIC AND ANAEROBIC Blood Culture adequate volume   Culture   Final    NO GROWTH 4 DAYS Performed at [facility], [address], [place], [place] [address]    Report Status PENDING  Incomplete  CULTURE, BLOOD (ROUTINE X 2) w Reflex to ID Panel     Status: None (Preliminary result)   Collection Time: 10/28/18  5:06 AM   Specimen: BLOOD  Result Value Ref Range Status   Specimen  Description BLOOD LEFT WRIST  Final   Special Requests   Final    BOTTLES DRAWN AEROBIC AND ANAEROBIC Blood Culture adequate volume   Culture   Final    NO GROWTH 4 DAYS Performed at [facility], [address] [place], [place] [address]    Report Status PENDING  Incomplete   Studies/Results: Dg Abd 1 View  Result Date: 10/31/2018 CLINICAL DATA:  Ileus EXAM: ABDOMEN - 1 VIEW COMPARISON:  Yesterday FINDINGS: No dilated bowel. Moderate stool intermittently seen along the colon. No concerning mass effect or calcification. High-density  material over the pelvis was not seen on abdominal CT from 4 days ago and has progressed by radiography. IMPRESSION: 1. Nonobstructive bowel gas pattern. 2. Moderate stool volume. 3. A high-density structure is progressing through the colon, now at the rectum. Is there a missing dental cap? Electronically Signed   By: Monte Fantasia M.D.   On: 10/31/2018 05:36   Dg Chest Port 1 View  Result Date: 10/31/2018 CLINICAL DATA:  Acute respiratory failure EXAM: PORTABLE CHEST 1 VIEW COMPARISON:  Yesterday FINDINGS: Chronic cardiomegaly. 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 Vascular congestion. Mild reticulation in the left mid lung, likely scarring. There is no edema, consolidation, effusion, or pneumothorax. IMPRESSION: Cardiomegaly and vascular congestion. Electronically Signed   By: Monte Fantasia M.D.   On: 10/31/2018 05:34   Medications:  Scheduled Meds: . [MAR Hold] sodium chloride   Intravenous Once  . [MAR Hold] atorvastatin  40 mg Oral Daily  . [MAR Hold] Chlorhexidine Gluconate Cloth  6 each Topical Daily  . [MAR Hold] fluticasone  2 spray Each Nare Daily  . [MAR Hold] folic acid  1 mg Oral Daily  . [MAR Hold] furosemide  40 mg Intravenous BID  . influenza vac split quadrivalent PF  0.5 mL Intramuscular Tomorrow-1000  . [MAR Hold] insulin aspart  0-5 Units Subcutaneous QHS  . [MAR Hold] insulin aspart  0-9 Units Subcutaneous TID  WC  . [MAR Hold] insulin glargine  10 Units Subcutaneous QHS  . [MAR Hold] magnesium citrate  1 Bottle Oral Once  . [MAR Hold] midodrine  5 mg Oral TID WC  . [MAR Hold] mometasone-formoterol  2 puff Inhalation BID  . [MAR Hold] pantoprazole  40 mg Intravenous Q12H  . [MAR Hold] pregabalin  75 mg Oral BID   Continuous Infusions: . [MAR Hold] sodium chloride 250 mL (10/31/18 0543)  . sodium chloride 1,000 mL (11/01/18 0941)  . [MAR Hold] ampicillin (OMNIPEN) IV 2 g (11/01/18 0508)   PRN Meds:.[MAR Hold] sodium chloride, [MAR Hold] acetaminophen, [MAR Hold] albuterol   Assessment: Anemia   Plan: Proceed with push enteroscopy today to evaluate non bleeding AVM seen in the small bowel and area of erythema in the stomach  I have discussed alternative options, risks & benefits,  which include, but are not limited to, bleeding, infection, perforation,respiratory complication & drug reaction.  The patient agrees with this plan & written consent will be obtained.      LOS: 6 days   Tanya Antigua, MD 11/01/2018, 10:17 AM

## 2018-11-02 ENCOUNTER — Encounter: Payer: Self-pay | Admitting: Gastroenterology

## 2018-11-02 DIAGNOSIS — Q2730 Arteriovenous malformation, site unspecified: Secondary | ICD-10-CM

## 2018-11-02 LAB — CBC
HCT: 28.4 % — ABNORMAL LOW (ref 36.0–46.0)
Hemoglobin: 8.2 g/dL — ABNORMAL LOW (ref 12.0–15.0)
MCH: 24.2 pg — ABNORMAL LOW (ref 26.0–34.0)
MCHC: 28.9 g/dL — ABNORMAL LOW (ref 30.0–36.0)
MCV: 83.8 fL (ref 80.0–100.0)
Platelets: 162 10*3/uL (ref 150–400)
RBC: 3.39 MIL/uL — ABNORMAL LOW (ref 3.87–5.11)
RDW: 22.5 % — ABNORMAL HIGH (ref 11.5–15.5)
WBC: 6.9 10*3/uL (ref 4.0–10.5)
nRBC: 0 % (ref 0.0–0.2)

## 2018-11-02 LAB — CULTURE, BLOOD (ROUTINE X 2)
Culture: NO GROWTH
Culture: NO GROWTH
Special Requests: ADEQUATE
Special Requests: ADEQUATE

## 2018-11-02 LAB — TYPE AND SCREEN
ABO/RH(D): O POS
Antibody Screen: NEGATIVE
Unit division: 0

## 2018-11-02 LAB — BPAM RBC
Blood Product Expiration Date: 202011162359
ISSUE DATE / TIME: 202010151301
Unit Type and Rh: 5100

## 2018-11-02 LAB — BASIC METABOLIC PANEL
Anion gap: 10 (ref 5–15)
BUN: 42 mg/dL — ABNORMAL HIGH (ref 8–23)
CO2: 23 mmol/L (ref 22–32)
Calcium: 8.2 mg/dL — ABNORMAL LOW (ref 8.9–10.3)
Chloride: 108 mmol/L (ref 98–111)
Creatinine, Ser: 2.71 mg/dL — ABNORMAL HIGH (ref 0.44–1.00)
GFR calc Af Amer: 21 mL/min — ABNORMAL LOW (ref >60)
GFR calc non Af Amer: 18 mL/min — ABNORMAL LOW (ref >60)
Glucose, Bld: 122 mg/dL — ABNORMAL HIGH (ref 70–99)
Potassium: 4.6 mmol/L (ref 3.5–5.1)
Sodium: 141 mmol/L (ref 135–145)

## 2018-11-02 LAB — GLUCOSE, CAPILLARY
Glucose-Capillary: 153 mg/dL — ABNORMAL HIGH (ref 70–99)
Glucose-Capillary: 134 mg/dL — ABNORMAL HIGH (ref 70–99)
Glucose-Capillary: 138 mg/dL — ABNORMAL HIGH (ref 70–99)
Glucose-Capillary: 183 mg/dL — ABNORMAL HIGH (ref 70–99)

## 2018-11-02 MED ORDER — PANTOPRAZOLE SODIUM 40 MG PO TBEC
40.0000 mg | DELAYED_RELEASE_TABLET | Freq: Two times a day (BID) | ORAL | Status: DC
  Administered 2018-11-02 – 2018-11-07 (×11): 40 mg via ORAL
  Filled 2018-11-02 (×11): qty 1

## 2018-11-02 NOTE — Progress Notes (Signed)
Tanya Antigua, MD [address] [place], [place], [place], [address] [address], [place], [place], [place], [address] Phone: [phone]  Fax: [phone]   Subjective: No active GI bleeding.  No abdominal pain   Objective: Exam: Vital signs in last 24 hours: Vitals:   11/01/18 1718 11/01/18 2110 11/02/18 0614 11/02/18 0834  BP: 139/76 118/60 (!) 118/51 133/72  Pulse: 85 89 88 91  Resp: 18 18 18 18   Temp: 98.4 F (36.9 C) 98.7 F (37.1 C) 98.4 F (36.9 C) 98.1 F (36.7 C)  TempSrc:  Oral Oral Oral  SpO2: 100% 98% 97% 98%  Weight:      Height:       Weight change:   Intake/Output Summary (Last 24 hours) at 11/02/2018 1504 Last data filed at 11/02/2018 1215 Gross per 24 hour  Intake 1806.79 ml  Output 3400 ml  Net -1593.21 ml    General: No acute distress, AAO x3 Abd: Soft, NT/ND, No HSM Skin: Warm, no rashes Neck: Supple, Trachea midline   Lab Results: Lab Results  Component Value Date   WBC 6.9 11/02/2018   HGB 8.2 (L) 11/02/2018   HCT 28.4 (L) 11/02/2018   MCV 83.8 11/02/2018   PLT 162 11/02/2018   Micro Results: Recent Results (from the past 240 hour(s))  Blood culture (routine x 2)     Status: Abnormal   Collection Time: 10/25/18  8:38 PM   Specimen: BLOOD  Result Value Ref Range Status   Specimen Description   Final    BLOOD LEFT ANTECUBITAL Performed at [facility], [address] [place], [place] [address]    Special Requests   Final    BOTTLES DRAWN AEROBIC AND ANAEROBIC Blood Culture results may not be optimal due to an excessive volume of blood received in culture bottles Performed at [facility], [address] [place], [place] [address]    Culture  Setup Time   Final    GRAM POSITIVE COCCI IN BOTH AEROBIC AND ANAEROBIC BOTTLES CRITICAL RESULT CALLED TO, READ BACK BY AND VERIFIED WITH: CHARLES SHANLEVER @0835  ON 10/26/2018 BY FMW Performed at [facility], [place] [address] [place],  [place] [address]    Culture STREPTOCOCCUS GROUP C (A)  Final   Report Status 10/28/2018 FINAL  Final   Organism ID, Bacteria STREPTOCOCCUS GROUP C  Final      Susceptibility   Streptococcus group c - MIC*    CLINDAMYCIN <=0.25 SENSITIVE Sensitive     AMPICILLIN <=0.25 SENSITIVE Sensitive     ERYTHROMYCIN <=0.12 SENSITIVE Sensitive     VANCOMYCIN 0.5 SENSITIVE Sensitive     CEFTRIAXONE <=0.12 SENSITIVE Sensitive     LEVOFLOXACIN 0.5 SENSITIVE Sensitive     PENICILLIN Value in next row Sensitive      SENSITIVE<=0.06    * STREPTOCOCCUS GROUP C  Blood culture (routine x 2)     Status: Abnormal   Collection Time: 10/25/18  8:38 PM   Specimen: BLOOD  Result Value Ref Range Status   Specimen Description   Final    BLOOD RIGHT ANTECUBITAL Performed at [facility], [address] [place], [place] [address]    Special Requests   Final    BOTTLES DRAWN AEROBIC AND ANAEROBIC Blood Culture results may not be optimal due to an excessive volume of blood received in culture bottles Performed at [facility], [address], [place], [place] [address]    Culture  Setup Time  Final    GRAM POSITIVE COCCI IN BOTH AEROBIC AND ANAEROBIC BOTTLES CRITICAL VALUE NOTED.  VALUE IS CONSISTENT WITH PREVIOUSLY REPORTED AND CALLED VALUE. Performed at [facility], [address] [place], [place] [address]    Culture (A)  Final    STREPTOCOCCUS GROUP C SUSCEPTIBILITIES PERFORMED ON PREVIOUS CULTURE WITHIN THE LAST 5 DAYS. Performed at [facility], [place] [address] [place], [place] [address]    Report Status 10/28/2018 FINAL  Final  Blood Culture ID Panel (Reflexed)     Status: Abnormal   Collection Time: 10/25/18  8:38 PM  Result Value Ref Range Status   Enterococcus species NOT DETECTED NOT DETECTED Final   Listeria monocytogenes NOT DETECTED NOT DETECTED Final   Staphylococcus species NOT DETECTED NOT DETECTED Final   Staphylococcus aureus (BCID) NOT DETECTED NOT  DETECTED Final   Streptococcus species DETECTED (A) NOT DETECTED Final    Comment: Not Enterococcus species, Streptococcus agalactiae, Streptococcus pyogenes, or Streptococcus pneumoniae. CRITICAL RESULT CALLED TO, READ BACK BY AND VERIFIED WITH: CHARLES SHANLEVER @0835  ON 10/26/2018 BY FMW    Streptococcus agalactiae NOT DETECTED NOT DETECTED Final   Streptococcus pneumoniae NOT DETECTED NOT DETECTED Final   Streptococcus pyogenes NOT DETECTED NOT DETECTED Final   Acinetobacter baumannii NOT DETECTED NOT DETECTED Final   Enterobacteriaceae species NOT DETECTED NOT DETECTED Final   Enterobacter cloacae complex NOT DETECTED NOT DETECTED Final   Escherichia coli NOT DETECTED NOT DETECTED Final   Klebsiella oxytoca NOT DETECTED NOT DETECTED Final   Klebsiella pneumoniae NOT DETECTED NOT DETECTED Final   Proteus species NOT DETECTED NOT DETECTED Final   Serratia marcescens NOT DETECTED NOT DETECTED Final   Haemophilus influenzae NOT DETECTED NOT DETECTED Final   Neisseria meningitidis NOT DETECTED NOT DETECTED Final   Pseudomonas aeruginosa NOT DETECTED NOT DETECTED Final   Candida albicans NOT DETECTED NOT DETECTED Final   Candida glabrata NOT DETECTED NOT DETECTED Final   Candida krusei NOT DETECTED NOT DETECTED Final   Candida parapsilosis NOT DETECTED NOT DETECTED Final   Candida tropicalis NOT DETECTED NOT DETECTED Final    Comment: Performed at [facility], [address] [place], [place] [address]  SARS Coronavirus 2 by RT PCR (hospital order, performed in [facility] hospital lab) Nasopharyngeal Nasopharyngeal Swab     Status: None   Collection Time: 10/25/18  8:49 PM   Specimen: Nasopharyngeal Swab  Result Value Ref Range Status   SARS Coronavirus 2 NEGATIVE NEGATIVE Final    Comment: (NOTE) If result is NEGATIVE SARS-CoV-2 target nucleic acids are NOT DETECTED. The SARS-CoV-2 RNA is generally detectable in upper and lower  respiratory specimens during the acute  phase of infection. The lowest  concentration of SARS-CoV-2 viral copies this assay can detect is 250  copies / mL. A negative result does not preclude SARS-CoV-2 infection  and should not be used as the sole basis for treatment or other  patient management decisions.  A negative result may occur with  improper specimen collection / handling, submission of specimen other  than nasopharyngeal swab, presence of viral mutation(s) within the  areas targeted by this assay, and inadequate number of viral copies  (<250 copies / mL). A negative result must be combined with clinical  observations, patient history, and epidemiological information. If result is POSITIVE SARS-CoV-2 target nucleic acids are DETECTED. The SARS-CoV-2 RNA is generally detectable in upper and lower  respiratory specimens dur ing the acute phase of infection.  Positive  results  are indicative of active infection with SARS-CoV-2.  Clinical  correlation with patient history and other diagnostic information is  necessary to determine patient infection status.  Positive results do  not rule out bacterial infection or co-infection with other viruses. If result is PRESUMPTIVE POSTIVE SARS-CoV-2 nucleic acids MAY BE PRESENT.   A presumptive positive result was obtained on the submitted specimen  and confirmed on repeat testing.  While 2019 novel coronavirus  (SARS-CoV-2) nucleic acids may be present in the submitted sample  additional confirmatory testing may be necessary for epidemiological  and / or clinical management purposes  to differentiate between  SARS-CoV-2 and other Sarbecovirus currently known to infect humans.  If clinically indicated additional testing with an alternate test  methodology [phone]) is advised. The SARS-CoV-2 RNA is generally  detectable in upper and lower respiratory sp ecimens during the acute  phase of infection. The expected result is Negative. Fact Sheet for Patients:   StrictlyIdeas.no Fact Sheet for Healthcare Providers: BankingDealers.co.za This test is not yet approved or cleared by the [place] FDA and has been authorized for detection and/or diagnosis of SARS-CoV-2 by FDA under an Emergency Use Authorization (EUA).  This EUA will remain in effect (meaning this test can be used) for the duration of the COVID-19 declaration under Section 564(b)(1) of the Act, 21 U.S.C. section 360bbb-3(b)(1), unless the authorization is terminated or revoked sooner. Performed at [facility], [address] [place], [place] [address]   MRSA PCR Screening     Status: Abnormal   Collection Time: 10/26/18 11:09 AM   Specimen: Nasopharyngeal  Result Value Ref Range Status   MRSA by PCR POSITIVE (A) NEGATIVE Final    Comment:        The GeneXpert MRSA Assay (FDA approved for NASAL specimens only), is one component of a comprehensive MRSA colonization surveillance program. It is not intended to diagnose MRSA infection nor to guide or monitor treatment for MRSA infections. RESULT CALLED TO, READ BACK BY AND VERIFIED WITH: EMILY GANNON AT 1630 10/26/2018.PMF Performed at [facility], [address] [place], [place] [address]   CULTURE, BLOOD (ROUTINE X 2) w Reflex to ID Panel     Status: None   Collection Time: 10/28/18 12:48 AM   Specimen: BLOOD  Result Value Ref Range Status   Specimen Description BLOOD RIGHT ANTECUBITAL  Final   Special Requests   Final    BOTTLES DRAWN AEROBIC AND ANAEROBIC Blood Culture adequate volume   Culture   Final    NO GROWTH 5 DAYS Performed at [facility], [address] [place], [place] [address]    Report Status 11/02/2018 FINAL  Final  CULTURE, BLOOD (ROUTINE X 2) w Reflex to ID Panel     Status: None   Collection Time: 10/28/18  5:06 AM   Specimen: BLOOD  Result Value Ref Range Status   Specimen Description BLOOD LEFT WRIST  Final    Special Requests   Final    BOTTLES DRAWN AEROBIC AND ANAEROBIC Blood Culture adequate volume   Culture   Final    NO GROWTH 5 DAYS Performed at [facility], [address], [place],  [address]    Report Status 11/02/2018 FINAL  Final   Studies/Results: No results found. Medications:  Scheduled Meds: . sodium chloride   Intravenous Once  . atorvastatin  40 mg Oral Daily  . Chlorhexidine Gluconate Cloth  6 each Topical Daily  . fluticasone  2 spray Each Nare  Daily  . folic acid  1 mg Oral Daily  . furosemide  40 mg Intravenous BID  . influenza vac split quadrivalent PF  0.5 mL Intramuscular Tomorrow-1000  . insulin aspart  0-5 Units Subcutaneous QHS  . insulin aspart  0-9 Units Subcutaneous TID WC  . insulin glargine  10 Units Subcutaneous QHS  . magnesium citrate  1 Bottle Oral Once  . mometasone-formoterol  2 puff Inhalation BID  . pantoprazole  40 mg Oral BID  . pregabalin  75 mg Oral BID   Continuous Infusions: . sodium chloride Stopped (11/02/18 0015)  . ampicillin (OMNIPEN) IV 2 g (11/02/18 1358)   PRN Meds:.sodium chloride, acetaminophen, albuterol   Assessment: Anemia   Plan: Hemoglobin 8.2 today No active GI bleeding GI service will sign off at this time Patient should follow-up in GI clinic within 1 to 2 weeks of discharge with Dr. Marius Ditch   LOS: 7 days   Tanya Antigua, MD 11/02/2018, 3:04 PM

## 2018-11-02 NOTE — Progress Notes (Signed)
Bed offer received from [facility] in [place]. Met with pt to discuss bed offer but pt is confused. She is only oriented to person. Contacted pt's husband Katheren Puller) at [phone] and left a vm to return CM call.

## 2018-11-02 NOTE — Progress Notes (Signed)
[facility], [place] 11/02/18  Subjective:  Creatinine coming down very slowly. Currently creatinine 2.71 with an EGFR 21. Hemoglobin also low at 8.2.    Objective:  Vital signs in last 24 hours:  Temp:  [98.1 F (36.7 C)-98.7 F (37.1 C)] 98.1 F (36.7 C) (10/16 0834) Pulse Rate:  [85-91] 91 (10/16 0834) Resp:  [18] 18 (10/16 0834) BP: (118-150)/(51-92) 133/72 (10/16 0834) SpO2:  [97 %-100 %] 98 % (10/16 0834)  Weight change:  Filed Weights   10/25/18 2215 10/29/18 1532 10/30/18 2303  Weight: 125.1 kg 126.8 kg 123.1 kg    Intake/Output:    Intake/Output Summary (Last 24 hours) at 11/02/2018 1518 Last data filed at 11/02/2018 1215 Gross per 24 hour  Intake 1806.79 ml  Output 3400 ml  Net -1593.21 ml     Physical Exam: General:  Obese female, laying in the bed, no acute distress  HEENT  moist oral mucous membranes  Neck  supple  Pulm/lungs  CTAB normal effort  CVS/Heart  regular rhythm, soft systolic murmur  Abdomen:   Soft, nontender  Extremities:  3+ pitting edema over thighs and lower abdomen  Neurologic:  Alert, oriented  Skin:  No acute rashes    Basic Metabolic Panel:  Recent Labs  Lab 10/29/18 0410 10/29/18 0904 10/30/18 0447 10/31/18 0645 11/01/18 0546 11/02/18 0622  NA 134*  --  134* 140 140 141  K 6.3* 5.1 5.6* 5.0 4.8 4.6  CL 109  --  107 111 108 108  CO2 17*  --  18* 15* 21* 23  GLUCOSE 136*  --  115* 123* 138* 122*  BUN 43*  --  41* 43* 42* 42*  CREATININE 3.54*  --  3.11* 3.02* 2.78* 2.71*  CALCIUM 7.8*  --  7.8* 8.3* 8.2* 8.2*     CBC: Recent Labs  Lab 10/27/18 0350 10/28/18 0048 10/30/18 0447 10/31/18 0645 11/01/18 0546 11/01/18 1840 11/02/18 0622  WBC 17.9* 17.8* 9.5 7.3 6.8  --  6.9  NEUTROABS 15.7*  --   --   --   --   --   --   HGB 6.6* 8.1* 8.0* 7.7* 7.1* 9.0* 8.2*  HCT 22.1* 27.2* 26.7* 26.6* 24.3* 31.1* 28.4*  MCV 77.8* 81.4 79.0* 81.8 81.8  --  83.8  PLT 160 193 151 179 167  --   162      Lab Results  Component Value Date   HEPBSAG NON REACTIVE 10/27/2018   HEPBIGM NON REACTIVE 10/27/2018      Microbiology:  Recent Results (from the past 240 hour(s))  Blood culture (routine x 2)     Status: Abnormal   Collection Time: 10/25/18  8:38 PM   Specimen: BLOOD  Result Value Ref Range Status   Specimen Description   Final    BLOOD LEFT ANTECUBITAL Performed at [facility], [address] [place], [place] [address]    Special Requests   Final    BOTTLES DRAWN AEROBIC AND ANAEROBIC Blood Culture results may not be optimal due to an excessive volume of blood received in culture bottles Performed at [facility], [address] [place], [place] [address]    Culture  Setup Time   Final    GRAM POSITIVE COCCI IN BOTH AEROBIC AND ANAEROBIC BOTTLES CRITICAL RESULT CALLED TO, READ BACK BY AND VERIFIED WITH: CHARLES SHANLEVER _0  ON 10/26/2018 BY FMW Performed at [facility], [address], [place], [place] [address]  Culture STREPTOCOCCUS GROUP C (A)  Final   Report Status 10/28/2018 FINAL  Final   Organism ID, Bacteria STREPTOCOCCUS GROUP C  Final      Susceptibility   Streptococcus group c - MIC*    CLINDAMYCIN <=0.25 SENSITIVE Sensitive     AMPICILLIN <=0.25 SENSITIVE Sensitive     ERYTHROMYCIN <=0.12 SENSITIVE Sensitive     VANCOMYCIN 0.5 SENSITIVE Sensitive     CEFTRIAXONE <=0.12 SENSITIVE Sensitive     LEVOFLOXACIN 0.5 SENSITIVE Sensitive     PENICILLIN Value in next row Sensitive      SENSITIVE<=0.06    * STREPTOCOCCUS GROUP C  Blood culture (routine x 2)     Status: Abnormal   Collection Time: 10/25/18  8:38 PM   Specimen: BLOOD  Result Value Ref Range Status   Specimen Description   Final    BLOOD RIGHT ANTECUBITAL Performed at [facility], [address] [place], [place] [address]    Special Requests   Final    BOTTLES DRAWN AEROBIC AND ANAEROBIC Blood Culture results may not be optimal due to an  excessive volume of blood received in culture bottles Performed at [facility], [address] [place], [place] [address]    Culture  Setup Time   Final    GRAM POSITIVE COCCI IN BOTH AEROBIC AND ANAEROBIC BOTTLES CRITICAL VALUE NOTED.  VALUE IS CONSISTENT WITH PREVIOUSLY REPORTED AND CALLED VALUE. Performed at [facility], [address] [place], [place] [address]    Culture (A)  Final    STREPTOCOCCUS GROUP C SUSCEPTIBILITIES PERFORMED ON PREVIOUS CULTURE WITHIN THE LAST 5 DAYS. Performed at [facility], [address], [place], [place] [address]    Report Status 10/28/2018 FINAL  Final  Blood Culture ID Panel (Reflexed)     Status: Abnormal   Collection Time: 10/25/18  8:38 PM  Result Value Ref Range Status   Enterococcus species NOT DETECTED NOT DETECTED Final   Listeria monocytogenes NOT DETECTED NOT DETECTED Final   Staphylococcus species NOT DETECTED NOT DETECTED Final   Staphylococcus aureus (BCID) NOT DETECTED NOT DETECTED Final   Streptococcus species DETECTED (A) NOT DETECTED Final    Comment: Not Enterococcus species, Streptococcus agalactiae, Streptococcus pyogenes, or Streptococcus pneumoniae. CRITICAL RESULT CALLED TO, READ BACK BY AND VERIFIED WITH: CHARLES SHANLEVER _0  ON 10/26/2018 BY FMW    Streptococcus agalactiae NOT DETECTED NOT DETECTED Final   Streptococcus pneumoniae NOT DETECTED NOT DETECTED Final   Streptococcus pyogenes NOT DETECTED NOT DETECTED Final   Acinetobacter baumannii NOT DETECTED NOT DETECTED Final   Enterobacteriaceae species NOT DETECTED NOT DETECTED Final   Enterobacter cloacae complex NOT DETECTED NOT DETECTED Final   Escherichia coli NOT DETECTED NOT DETECTED Final   Klebsiella oxytoca NOT DETECTED NOT DETECTED Final   Klebsiella pneumoniae NOT DETECTED NOT DETECTED Final   Proteus species NOT DETECTED NOT DETECTED Final   Serratia marcescens NOT DETECTED NOT DETECTED Final   Haemophilus influenzae NOT  DETECTED NOT DETECTED Final   Neisseria meningitidis NOT DETECTED NOT DETECTED Final   Pseudomonas aeruginosa NOT DETECTED NOT DETECTED Final   Candida albicans NOT DETECTED NOT DETECTED Final   Candida glabrata NOT DETECTED NOT DETECTED Final   Candida krusei NOT DETECTED NOT DETECTED Final   Candida parapsilosis NOT DETECTED NOT DETECTED Final   Candida tropicalis NOT DETECTED NOT DETECTED Final    Comment: Performed at [facility], [address], [place], [place] [address]  SARS Coronavirus 2 by  RT PCR (hospital order, performed in [facility] hospital lab) Nasopharyngeal Nasopharyngeal Swab     Status: None   Collection Time: 10/25/18  8:49 PM   Specimen: Nasopharyngeal Swab  Result Value Ref Range Status   SARS Coronavirus 2 NEGATIVE NEGATIVE Final    Comment: (NOTE) If result is NEGATIVE SARS-CoV-2 target nucleic acids are NOT DETECTED. The SARS-CoV-2 RNA is generally detectable in upper and lower  respiratory specimens during the acute phase of infection. The lowest  concentration of SARS-CoV-2 viral copies this assay can detect is 250  copies / mL. A negative result does not preclude SARS-CoV-2 infection  and should not be used as the sole basis for treatment or other  patient management decisions.  A negative result may occur with  improper specimen collection / handling, submission of specimen other  than nasopharyngeal swab, presence of viral mutation(s) within the  areas targeted by this assay, and inadequate number of viral copies  (<250 copies / mL). A negative result must be combined with clinical  observations, patient history, and epidemiological information. If result is POSITIVE SARS-CoV-2 target nucleic acids are DETECTED. The SARS-CoV-2 RNA is generally detectable in upper and lower  respiratory specimens dur ing the acute phase of infection.  Positive  results are indicative of active infection with SARS-CoV-2.  Clinical  correlation with patient  history and other diagnostic information is  necessary to determine patient infection status.  Positive results do  not rule out bacterial infection or co-infection with other viruses. If result is PRESUMPTIVE POSTIVE SARS-CoV-2 nucleic acids MAY BE PRESENT.   A presumptive positive result was obtained on the submitted specimen  and confirmed on repeat testing.  While 2019 novel coronavirus  (SARS-CoV-2) nucleic acids may be present in the submitted sample  additional confirmatory testing may be necessary for epidemiological  and / or clinical management purposes  to differentiate between  SARS-CoV-2 and other Sarbecovirus currently known to infect humans.  If clinically indicated additional testing with an alternate test  methodology [phone]) is advised. The SARS-CoV-2 RNA is generally  detectable in upper and lower respiratory sp ecimens during the acute  phase of infection. The expected result is Negative. Fact Sheet for Patients:  StrictlyIdeas.no Fact Sheet for Healthcare Providers: BankingDealers.co.za This test is not yet approved or cleared by the [place] FDA and has been authorized for detection and/or diagnosis of SARS-CoV-2 by FDA under an Emergency Use Authorization (EUA).  This EUA will remain in effect (meaning this test can be used) for the duration of the COVID-19 declaration under Section 564(b)(1) of the Act, 21 U.S.C. section 360bbb-3(b)(1), unless the authorization is terminated or revoked sooner. Performed at [facility], [address] [place], [place] [address]   MRSA PCR Screening     Status: Abnormal   Collection Time: 10/26/18 11:09 AM   Specimen: Nasopharyngeal  Result Value Ref Range Status   MRSA by PCR POSITIVE (A) NEGATIVE Final    Comment:        The GeneXpert MRSA Assay (FDA approved for NASAL specimens only), is one component of a comprehensive MRSA colonization surveillance  program. It is not intended to diagnose MRSA infection nor to guide or monitor treatment for MRSA infections. RESULT CALLED TO, READ BACK BY AND VERIFIED WITH: EMILY GANNON AT 1630 10/26/2018.PMF Performed at [facility], [address] [place], [place] [address]   CULTURE, BLOOD (ROUTINE X 2) w Reflex to ID Panel     Status: None  Collection Time: 10/28/18 12:48 AM   Specimen: BLOOD  Result Value Ref Range Status   Specimen Description BLOOD RIGHT ANTECUBITAL  Final   Special Requests   Final    BOTTLES DRAWN AEROBIC AND ANAEROBIC Blood Culture adequate volume   Culture   Final    NO GROWTH 5 DAYS Performed at [facility], [address] [place], [place] [address]    Report Status 11/02/2018 FINAL  Final  CULTURE, BLOOD (ROUTINE X 2) w Reflex to ID Panel     Status: None   Collection Time: 10/28/18  5:06 AM   Specimen: BLOOD  Result Value Ref Range Status   Specimen Description BLOOD LEFT WRIST  Final   Special Requests   Final    BOTTLES DRAWN AEROBIC AND ANAEROBIC Blood Culture adequate volume   Culture   Final    NO GROWTH 5 DAYS Performed at [facility], [address], [place], [place] [address]    Report Status 11/02/2018 FINAL  Final    Coagulation Studies: No results for input(s): LABPROT, INR in the last 72 hours.  Urinalysis: No results for input(s): COLORURINE, LABSPEC, PHURINE, GLUCOSEU, HGBUR, BILIRUBINUR, KETONESUR, PROTEINUR, UROBILINOGEN, NITRITE, LEUKOCYTESUR in the last 72 hours.  Invalid input(s): APPERANCEUR    Imaging: No results found.   Medications:   . sodium chloride Stopped (11/02/18 0015)  . ampicillin (OMNIPEN) IV 2 g (11/02/18 1358)   . sodium chloride   Intravenous Once  . atorvastatin  40 mg Oral Daily  . Chlorhexidine Gluconate Cloth  6 each Topical Daily  . fluticasone  2 spray Each Nare Daily  . folic acid  1 mg Oral Daily  . furosemide  40 mg Intravenous BID  . influenza vac split  quadrivalent PF  0.5 mL Intramuscular Tomorrow-1000  . insulin aspart  0-5 Units Subcutaneous QHS  . insulin aspart  0-9 Units Subcutaneous TID WC  . insulin glargine  10 Units Subcutaneous QHS  . magnesium citrate  1 Bottle Oral Once  . mometasone-formoterol  2 puff Inhalation BID  . pantoprazole  40 mg Oral BID  . pregabalin  75 mg Oral BID   sodium chloride, acetaminophen, albuterol  Assessment/ Plan:  63 y.o. African American female with Medical problems of poorly controlled diabetes, hepatic cirrhosis, admission for gastric ulcer in 2018, congestive heart failure, grade 2 diastolic dysfunction, concentric LVH, Chronic low back pain, obstructive sleep apnea, history of CAD/STEMI, hepatitis C 2016  #Acute kidney injury/#Chronic kidney disease stage IIIA Baseline creatinine 1.20/GFR 48 from August 29, 2018 Urinalysis from August 2 shows moderate blood, 0-5 RBCs, 0-5 WBCs.  Negative for protein.  UPEP negative for M spike in 2018 and Jan 2020. SPEP neg for M spike in 01/2018  Renal imaging in the form of CT abdomen pelvis without contrast shows bilateral perinephric stranding, no stone or obstruction. -Creatinine continues to come down slowly.  Creatinine currently 2.7 with an EGFR of 21.  She will need continued monitoring of her renal parameters as an outpatient.   #Anemia of chronic kidney disease -Hemoglobin has come up to 8.2 from a low of 7.12 days ago.  She may require Epogen as an outpatient.  #Generalized edema 2D echo from August 2020 shows low normal LV EF of 50 to 55%.  Mild concentric LVH and diastolic dysfunction Patient is noted to have hypoalbuminemia -Convert to p.o. furosemide upon discharge.    LOS: 7 [name] 10/16/20203:18 PM  [facility] [place], [place]  Note: This note was prepared with Dragon dictation. Any transcription errors are unintentional

## 2018-11-02 NOTE — Progress Notes (Signed)
Received call from Dr. Manuella Ghazi. He provided pt's husband # [phone].Dr. Manuella Ghazi reports that pt is ready to be D/C today.  Contacted pt's husband and he agrees with [facility] facility in [place]. Contacted Freda Munro at [facility] [phone]. Informed Freda Munro that pt is ready to be D/C today. She is going to check pt's insurance and will f/u. She stated that she is not able to accept pt today. Notified Dr. Manuella Ghazi and pt's husband that the facility is not able to accept pt today. The plan is to D/C pt on Monday and husband made aware. Will continue to f/u.

## 2018-11-02 NOTE — Progress Notes (Signed)
Physical Therapy Treatment Patient Details Name: [name] MRN: [id number] DOB: 10/17/1955 Today's Date: 11/02/2018    History of Present Illness Pt is a 63 y.o. female presenting to hospital 10/25/18 s/p fall trying to get up from toilet; pt with generalized weakness and SOB.  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  Pt transferred to CCU 10/29/18 secondary hyperkalemia and now transferred to medical floor.  Pt s/p small bowel enteroscopy 11/01/18 (general anesthesia) and new PT consult received 11/02/18.  PMH includes median sternotomy, CABG, L great toe amp 6/7, cervical CA, CHF, chronic anemia, COPD, DM, NSTEMI, upper GI bleed, h/o carpal tunnel syndrome, HNP lumbar.    PT Comments    Pt seen for PT/OT co-treat.  2 assist semi-supine to/from sit.  Pt sat edge of bed performing ADL's with OT; good sitting balance.  Unable to stand from bed with 2 assist 1st 2 trials but then able to stand with max assist x2 up to RW 3rd trial and pt then stood for about 2 minutes leaning forward with B forearms on walker (to get cleaned up d/t bowel incontinence) before fatiguing and needing to sit down.  Will continue to focus on strengthening and progressive functional mobility per pt tolerance.  POC remains appropriate.    Follow Up Recommendations  SNF     Equipment Recommendations  Rolling walker with 5" wheels;3in1 (PT);Wheelchair (measurements PT);Wheelchair cushion (measurements PT)    Recommendations for Other Services OT consult     Precautions / Restrictions Precautions Precautions: Fall Precaution Comments: L midline Restrictions Weight Bearing Restrictions: No    Mobility  Bed Mobility Overal bed mobility: Needs Assistance Bed Mobility: Supine to Sit;Sit to Supine     Supine to sit: Mod assist;+2 for  safety/equipment;HOB elevated Sit to supine: Max assist;+2 for safety/equipment;HOB elevated   General bed mobility comments: mod assist x2 semi-supine to sit and max assist x2 sit to supine (2 assist to boost pt up in bed); vc's for technique  Transfers Overall transfer level: Needs assistance Equipment used: Rolling walker (2 wheeled) Transfers: Sit to/from Stand Sit to Stand: Max assist;+2 physical assistance         General transfer comment: 3 trials; 1st and 2nd trial pt unable to stand with 2 assist and use of momentum; 3rd trial able to stand with max assist x2; vc's for scooting to edge of bed, LE placement, and overall technique to stand  Ambulation/Gait             General Gait Details: deferred d/t pt fatigue   Stairs             Wheelchair Mobility    Modified Rankin (Stroke Patients Only)       Balance Overall balance assessment: Needs assistance Sitting-balance support: No upper extremity supported;Feet supported Sitting balance-Leahy Scale: Good Sitting balance - Comments: steady sitting reaching within BOS   Standing balance support: Bilateral upper extremity supported Standing balance-Leahy Scale: Poor Standing balance comment: pt requiring heavy B UE support on RW for static standing balance (pt able to stand about 2 minutes leaning forward with B forearms on walker to get cleaned up d/t bowel incontinence)                            Cognition Arousal/Alertness: Awake/alert Behavior During Therapy: [facility] for  tasks assessed/performed Overall Cognitive Status: Within Functional Limits for tasks assessed                                        Exercises      General Comments   Nursing cleared pt for participation in physical therapy.  Pt agreeable to PT session.      Pertinent Vitals/Pain Pain Assessment: Faces Faces Pain Scale: Hurts even more(no pain end of session at rest) Pain Location: L wrist pain with  certain movements (none with WB'ing through L hand though) and initial L UE burning (nurse notified and came to assess pt--recent IV medication started) Pain Descriptors / Indicators: Sharp;Burning Pain Intervention(s): Limited activity within patient's tolerance;Monitored during session;Repositioned(nurse notified of burning L UE)    Home Living                      Prior Function            PT Goals (current goals can now be found in the care plan section) Acute Rehab PT Goals Patient Stated Goal: to get stronger and be able to walk PT Goal Formulation: With patient Time For Goal Achievement: 11/14/18 Potential to Achieve Goals: Good Progress towards PT goals: Progressing toward goals    Frequency    Min 2X/week      PT Plan Current plan remains appropriate    Co-evaluation PT/OT/SLP Co-Evaluation/Treatment: Yes Reason for Co-Treatment: For patient/therapist safety;To address functional/ADL transfers PT goals addressed during session: Mobility/safety with mobility;Balance;Proper use of DME;Strengthening/ROM OT goals addressed during session: ADL's and self-care;Proper use of Adaptive equipment and DME;Strengthening/ROM      AM-PAC PT "6 Clicks" Mobility   Outcome Measure  Help needed turning from your back to your side while in a flat bed without using bedrails?: A Lot Help needed moving from lying on your back to sitting on the side of a flat bed without using bedrails?: Total Help needed moving to and from a bed to a chair (including a wheelchair)?: Total Help needed standing up from a chair using your arms (e.g., wheelchair or bedside chair)?: Total Help needed to walk in hospital room?: Total Help needed climbing 3-5 steps with a railing? : Total 6 Click Score: 7    End of Session Equipment Utilized During Treatment: Gait belt Activity Tolerance: Patient limited by fatigue Patient left: in bed;with call bell/phone within reach;with bed alarm set Nurse  Communication: Mobility status;Precautions;Other (comment)(Pt's L UE pain) PT Visit Diagnosis: Other abnormalities of gait and mobility (R26.89);Muscle weakness (generalized) (M62.81);History of falling (Z91.81);Difficulty in walking, not elsewhere classified (R26.2)     Time: 5883-2549 PT Time Calculation (min) (ACUTE ONLY): 36 min  Charges:  $Therapeutic Activity: 23-37 mins                     Leitha Bleak, PT 11/02/18, 3:04 PM [phone]

## 2018-11-02 NOTE — Progress Notes (Signed)
[place] [facility] [place] NAME: [name]    MR#:  [id number]  DATE OF BIRTH:  1955/10/08  SUBJECTIVE:  Doing fine somewhat sleepy, hemoglobin 8.2 REVIEW OF SYSTEMS:  CONSTITUTIONAL: No fever, reports fatigue or weakness.  EYES: No blurred or double vision.  EARS, NOSE, AND THROAT: No tinnitus or ear pain.  RESPIRATORY: No cough, shortness of breath, wheezing or hemoptysis.  CARDIOVASCULAR: No chest pain, orthopnea, edema.  GASTROINTESTINAL: No nausea, vomiting, diarrhea or abdominal pain.  GENITOURINARY: No dysuria, hematuria.  ENDOCRINE: No polyuria, nocturia,  HEMATOLOGY: No anemia, easy bruising or bleeding SKIN: No rash or lesion. MUSCULOSKELETAL: No joint pain or arthritis.   NEUROLOGIC: No tingling, numbness, weakness.  PSYCHIATRY: No anxiety or depression.  DRUG ALLERGIES:   Allergies  Allergen Reactions  . Latex Anaphylaxis and Rash  . Gabapentin Nausea And Vomiting and Other (See Comments)    VITALS:  Blood pressure 133/72, pulse 91, temperature 98.1 F (36.7 C), temperature source Oral, resp. rate 18, height 5' 8"  (1.727 m), weight 123.1 kg, SpO2 98 %. PHYSICAL EXAMINATION:  GENERAL:  63 y.o.-year-old patient lying in the bed with no acute distress.  EYES: Pupils equal, round, reactive to light and accommodation. No scleral icterus. Extraocular muscles intact.  HEENT: Head atraumatic, normocephalic. Oropharynx and nasopharynx clear.  NECK:  Supple, no jugular venous distention. No thyroid enlargement, no tenderness.  LUNGS: Normal breath sounds bilaterally, no wheezing, rales,rhonchi or crepitation. No use of accessory muscles of respiration.  CARDIOVASCULAR: S1, S2 normal. No murmurs, rubs, or gallops.  ABDOMEN: Soft, nontender, nondistended. Bowel sounds present.  EXTREMITIES: No pedal edema, cyanosis, or clubbing.  NEUROLOGIC: Patient is arousable but sleepy sensation intact. Gait not checked.  PSYCHIATRIC:  The patient is sleepy SKIN: No obvious rash, lesion, or ulcer.  LABORATORY PANEL:   CBC Recent Labs  Lab 11/02/18 0622  WBC 6.9  HGB 8.2*  HCT 28.4*  PLT 162   ------------------------------------------------------------------------------------------------------------------  Chemistries  Recent Labs  Lab 10/27/18 0350  11/02/18 0622  NA 135   < > 141  K 4.8   < > 4.6  CL 109   < > 108  CO2 19*   < > 23  GLUCOSE 98   < > 122*  BUN 33*   < > 42*  CREATININE 2.95*   < > 2.71*  CALCIUM 7.9*   < > 8.2*  AST 17  --   --   ALT 9  --   --   ALKPHOS 74  --   --   BILITOT 2.5*  --   --    < > = values in this interval not displayed.   ------------------------------------------------------------------------------------------------------------------  Cardiac Enzymes No results for input(s): TROPONINI in the last 168 hours. ------------------------------------------------------------------------------------------------------------------  RADIOLOGY:  No results found.  EKG:   Orders placed or performed during the hospital encounter of 10/25/18  . EKG 12-Lead  . EKG 12-Lead  . EKG 12-Lead  . EKG 12-Lead  . EKG 12-Lead  . EKG 12-Lead  . EKG 12-Lead  . EKG 12-Lead    ASSESSMENT AND PLAN:  63 y.o. 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 Delana Meyer, and CKD to the ED with worsening shortness of breath and fall.  1. Sepsis with multiorgan failure -sepsis  present on admission - Covid negative - Likely urinary source - Blood cultures strep group C in 4 out of 4 bottles - continue ampicillin for 2 weeks per ID, per ID we may be able to switch her to oral antibiotic at discharge - TEE neg for any valve vegetations  2.  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  Status post push enteroscopy showing no active bleeding, gave 1 dose of IV iron yesterday on 10/15  3. Elevated troponin -due to demand ischemia  4. DM: sugars have been well-controlled on current regimen  - hemoglobin A1c 1 6/5 was 5.9 - sliding scale insulin coverage in addition to Lantus - ADA 2100 calorie diet    5. Chronic congestive heart failure with reduced EF - stable Echocardiogram with moderate LVH and EF at 40 to45% and diffuse hypokinesis noted. Patient has had bioprosthesis for aortic valve - lasix 40 mg IV bid, -9 L fluid balance - Low salt diet   6. COPD -noevidence of exacerbation.  - Supplemental O2, goal sat 88-92% - Duonebs Q 6 hours - Continue home inhalers  7. Acute on chronic Kidney Disease 3 -improving creat 3.54-> 3.0 - Hold nephrotoxins, Nephrology following - Continue to monitor renal function - reduced Lyrica dose  8.  Anasarca - on Lasix 40 mg IV bid now, neg 9 L  9.weakness-PT recommends rehab, I did have a talk with her husband who is in agreement with same  10. Hyperkalemia: Resolved  Only bed offer she has is at [facility] in [place] has offered bed but they cannot take her today.  This will likely happen early next week   All the records are reviewed and case discussed with Care Management/Social Worker. Management plans discussed with the patient, nursing and they are in agreement.  CODE STATUS: Full code  TOTAL TIME TAKING CARE OF THIS PATIENT: 35 minutes.   POSSIBLE D/C IN 3-4 DAYS, DEPENDING  ON CLINICAL CONDITION.  Note: This dictation was prepared with Dragon dictation along with smaller phrase technology. Any transcriptional errors that result from this process are unintentional.   Max Sane M.D on 11/02/2018 at 2:38 PM  Between 7am to 6pm - Pager - [phone] After 6pm go to [email or web address] - password [email or web address] [facility]  Office  [phone]  CC: Primary care physician; [place]

## 2018-11-02 NOTE — Progress Notes (Signed)
Occupational Therapy Treatment Patient Details Name: [name] MRN: [id number] DOB: July 05, 1955 Today's Date: 11/02/2018    History of present illness Pt is a 63 y.o. female presenting to hospital 10/25/18 s/p fall trying to get up from toilet; pt with generalized weakness and SOB.  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  Pt transferred to CCU 10/29/18 secondary hyperkalemia and now transferred to medical floor.  Pt s/p small bowel enteroscopy 11/01/18 (general anesthesia) and new OT consult recieved 11/02/18.  PMH includes median sternotomy, CABG, L great toe amp 6/7, cervical CA, CHF, chronic anemia, COPD, DM, NSTEMI, upper GI bleed, h/o carpal tunnel syndrome, HNP lumbar.   OT comments  [name] was seen for OT/PT co-treatment this date. Upon arrival to room pt awake/alert, semi-supine in bed with lunch tray. Pt agreeable to tx and denies pain at rest. OT/PT assisted pt with coming EOB. Afteward, Pt assisted with UB bathing, and dressing (see ADL section below or more detail). Pt attempted standing x3 and with max assist +2 was able to come to standing. OT provided total assist for peri-care/clean up as pt was found to have bowel incontinence this date. Afterward, pt assisted back to bed. Pt making good progress toward goals and continues to benefit from skilled OT services to maximize return to PLOF and minimize risk of future falls, injury, caregiver burden, and readmission. Will continue to follow POC. Discharge recommendation remains appropriate.    Follow Up Recommendations  SNF    Equipment Recommendations  None recommended by OT(Pt has necessary equipment)    Recommendations for Other Services      Precautions / Restrictions Precautions Precautions: Fall Precaution Comments: L midline Restrictions Weight  Bearing Restrictions: No       Mobility Bed Mobility Overal bed mobility: Needs Assistance Bed Mobility: Supine to Sit;Sit to Supine     Supine to sit: Mod assist;+2 for safety/equipment;HOB elevated Sit to supine: Max assist;+2 for safety/equipment;HOB elevated   General bed mobility comments: mod assist x2 semi-supine to sit and max assist x2 sit to supine (2 assist to boost pt up in bed); vc's for technique  Transfers Overall transfer level: Needs assistance Equipment used: Rolling walker (2 wheeled) Transfers: Sit to/from Stand Sit to Stand: Max assist;+2 physical assistance         General transfer comment: 3 trials; 1st and 2nd trial pt unable to stand with 2 assist and use of momentum; 3rd trial able to stand with max assist x2; vc's for scooting to edge of bed, LE placement, and overall technique to stand    Balance Overall balance assessment: Needs assistance Sitting-balance support: No upper extremity supported;Feet supported Sitting balance-Leahy Scale: Good Sitting balance - Comments: steady sitting reaching within BOS   Standing balance support: Bilateral upper extremity supported Standing balance-Leahy Scale: Poor Standing balance comment: pt requiring heavy B UE support on RW for static standing balance (pt able to stand about 2 minutes leaning forward with B forearms on walker to get cleaned up d/t bowel incontinence)                           ADL either performed or assessed with clinical judgement   ADL Overall ADL's : Needs assistance/impaired  General ADL Comments: Pt continues to require require mod/max assist for bed mobility and mod assist +2 for functional transfers using a RW. Pt demonstreated increased fatigue this date and was unable to attempt functional transfer. She was able to complete seated bath at EOB with limited UB dressing and OT providing supervision for safety t/o. Pt  attempted to stand at EOB and was noted to have significant amount of BM on buttocks. Pt required total assist for peri care while standing leaning over on RW. Pt fatigues quickly with this activity. Cues throughout ADL mgt for PLB. SPO2 WFL t/o session.     Vision Baseline Vision/History: Wears glasses Wears Glasses: At all times Patient Visual Report: No change from baseline     Perception     Praxis      Cognition Arousal/Alertness: Awake/alert Behavior During Therapy: WFL for tasks assessed/performed Overall Cognitive Status: Within Functional Limits for tasks assessed                                          Exercises Other Exercises Other Exercises: Pt assisted with seated UB bath at EOB with cues to avoid IV sites this date. Pt also assisted with peri-care at EOB 2/2 bowel incontinence this date.   Shoulder Instructions       General Comments      Pertinent Vitals/ Pain       Pain Assessment: Faces Faces Pain Scale: Hurts even more(No pain at end of session and at rest.) Pain Location: L wrist pain with certain movements (none with WB'ing through L hand though) and initial L UE burning (nurse notified and came to assess pt--recent IV medication started) Pain Descriptors / Indicators: Sharp;Burning Pain Intervention(s): Limited activity within patient's tolerance;Monitored during session;Repositioned;Other (comment)(RN notified and in room to assess during session.)  Home Living Family/patient expects to be discharged to:: Private residence                                        Prior Functioning/Environment              Frequency  Min 1X/week        Progress Toward Goals  OT Goals(current goals can now be found in the care plan section)  Progress towards OT goals: Progressing toward goals  Acute Rehab OT Goals Patient Stated Goal: to get stronger and be able to walk OT Goal Formulation: With patient Time For Goal  Achievement: 11/14/18 Potential to Achieve Goals: Good  Plan Discharge plan remains appropriate;Frequency remains appropriate    Co-evaluation      Reason for Co-Treatment: For patient/therapist safety;To address functional/ADL transfers PT goals addressed during session: Mobility/safety with mobility;Balance;Proper use of DME;Strengthening/ROM OT goals addressed during session: ADL's and self-care;Proper use of Adaptive equipment and DME      AM-PAC OT "6 Clicks" Daily Activity     Outcome Measure   Help from another person eating meals?: None Help from another person taking care of personal grooming?: A Little Help from another person toileting, which includes using toliet, bedpan, or urinal?: A Lot Help from another person bathing (including washing, rinsing, drying)?: A Lot Help from another person to put on and taking off regular upper body clothing?: A Lot Help from another person to put on and taking off regular  lower body clothing?: A Lot 6 Click Score: 15    End of Session Equipment Utilized During Treatment: Gait belt;Rolling walker  OT Visit Diagnosis: Other abnormalities of gait and mobility (R26.89);Muscle weakness (generalized) (M62.81)   Activity Tolerance Patient limited by fatigue   Patient Left in bed;with call bell/phone within reach;with bed alarm set   Nurse Communication          Time: 3716-9678 OT Time Calculation (min): 59 min  Charges: OT General Charges $OT Visit: 1 Visit OT Treatments $Self Care/Home Management : 23-37 mins  Shara Blazing, M.S., OTR/L Ascom: [phone] 11/02/18, 4:10 PM

## 2018-11-03 LAB — GLUCOSE, CAPILLARY
Glucose-Capillary: 101 mg/dL — ABNORMAL HIGH (ref 70–99)
Glucose-Capillary: 168 mg/dL — ABNORMAL HIGH (ref 70–99)
Glucose-Capillary: 169 mg/dL — ABNORMAL HIGH (ref 70–99)
Glucose-Capillary: 169 mg/dL — ABNORMAL HIGH (ref 70–99)

## 2018-11-03 LAB — CBC
HCT: 29.1 % — ABNORMAL LOW (ref 36.0–46.0)
Hemoglobin: 8.5 g/dL — ABNORMAL LOW (ref 12.0–15.0)
MCH: 24.1 pg — ABNORMAL LOW (ref 26.0–34.0)
MCHC: 29.2 g/dL — ABNORMAL LOW (ref 30.0–36.0)
MCV: 82.4 fL (ref 80.0–100.0)
Platelets: 161 10*3/uL (ref 150–400)
RBC: 3.53 MIL/uL — ABNORMAL LOW (ref 3.87–5.11)
RDW: 22.8 % — ABNORMAL HIGH (ref 11.5–15.5)
WBC: 8.1 10*3/uL (ref 4.0–10.5)
nRBC: 0 % (ref 0.0–0.2)

## 2018-11-03 LAB — BASIC METABOLIC PANEL
Anion gap: 12 (ref 5–15)
BUN: 42 mg/dL — ABNORMAL HIGH (ref 8–23)
CO2: 23 mmol/L (ref 22–32)
Calcium: 8.3 mg/dL — ABNORMAL LOW (ref 8.9–10.3)
Chloride: 106 mmol/L (ref 98–111)
Creatinine, Ser: 2.34 mg/dL — ABNORMAL HIGH (ref 0.44–1.00)
GFR calc Af Amer: 25 mL/min — ABNORMAL LOW (ref >60)
GFR calc non Af Amer: 21 mL/min — ABNORMAL LOW (ref >60)
Glucose, Bld: 111 mg/dL — ABNORMAL HIGH (ref 70–99)
Potassium: 4.2 mmol/L (ref 3.5–5.1)
Sodium: 141 mmol/L (ref 135–145)

## 2018-11-03 NOTE — Progress Notes (Signed)
[place] [facility] [place] NAME: [name]    MR#:  [id number]  DATE OF BIRTH:  Aug 13, 1955  SUBJECTIVE:   Pt denies any complatins   REVIEW OF SYSTEMS:  CONSTITUTIONAL: No fever, reports fatigue or weakness.  EYES: No blurred or double vision.  EARS, NOSE, AND THROAT: No tinnitus or ear pain.  RESPIRATORY: No cough, shortness of breath, wheezing or hemoptysis.  CARDIOVASCULAR: No chest pain, orthopnea, edema.  GASTROINTESTINAL: No nausea, vomiting, diarrhea or abdominal pain.  GENITOURINARY: No dysuria, hematuria.  ENDOCRINE: No polyuria, nocturia,  HEMATOLOGY: No anemia, easy bruising or bleeding SKIN: No rash or lesion. MUSCULOSKELETAL: No joint pain or arthritis.   NEUROLOGIC: No tingling, numbness, weakness.  PSYCHIATRY: No anxiety or depression.  DRUG ALLERGIES:   Allergies  Allergen Reactions  . Latex Anaphylaxis and Rash  . Gabapentin Nausea And Vomiting and Other (See Comments)    VITALS:  Blood pressure (!) 147/71, pulse 88, temperature 98.6 F (37 C), temperature source Oral, resp. rate 20, height 5' 8"  (1.727 m), weight 123.1 kg, SpO2 100 %. PHYSICAL EXAMINATION:  GENERAL:  63 y.o.-year-old patient lying in the bed with no acute distress.  EYES: Pupils equal, round, reactive to light and accommodation. No scleral icterus. Extraocular muscles intact.  HEENT: Head atraumatic, normocephalic. Oropharynx and nasopharynx clear.  NECK:  Supple, no jugular venous distention. No thyroid enlargement, no tenderness.  LUNGS: Normal breath sounds bilaterally, no wheezing, rales,rhonchi or crepitation. No use of accessory muscles of respiration.  CARDIOVASCULAR: S1, S2 normal. No murmurs, rubs, or gallops.  ABDOMEN: Soft, nontender, nondistended. Bowel sounds present.  EXTREMITIES: No pedal edema, cyanosis, or clubbing.  NEUROLOGIC: Patient is arousable but sleepy sensation intact. Gait not checked.  PSYCHIATRIC: The  patient is sleepy SKIN: No obvious rash, lesion, or ulcer.  LABORATORY PANEL:   CBC Recent Labs  Lab 11/03/18 0643  WBC 8.1  HGB 8.5*  HCT 29.1*  PLT 161   ------------------------------------------------------------------------------------------------------------------  Chemistries  Recent Labs  Lab 11/03/18 0643  NA 141  K 4.2  CL 106  CO2 23  GLUCOSE 111*  BUN 42*  CREATININE 2.34*  CALCIUM 8.3*   ------------------------------------------------------------------------------------------------------------------  Cardiac Enzymes No results for input(s): TROPONINI in the last 168 hours. ------------------------------------------------------------------------------------------------------------------  RADIOLOGY:  No results found.  EKG:   Orders placed or performed during the hospital encounter of 10/25/18  . EKG 12-Lead  . EKG 12-Lead  . EKG 12-Lead  . EKG 12-Lead  . EKG 12-Lead  . EKG 12-Lead  . EKG 12-Lead  . EKG 12-Lead    ASSESSMENT AND PLAN:  63 y.o. 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 Delana Meyer, and CKD to the ED with worsening shortness of breath and fall.  1. Sepsis with multiorgan failure -sepsis present on admission - Covid negative - Likely urinary source - Blood cultures strep group C in 4 out of 4 bottles - continue ampicillin for 2 weeks per ID, per ID we may be able to switch her to oral antibiotic at discharge - TEE neg for any valve vegetations  2.  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  Status post push enteroscopy showing no active bleeding, gave 1 dose of IV iron yesterday on 10/15  3. Elevated troponin -due to demand ischemia  4. DM: sugars have been well-controlled on current regimen  - hemoglobin A1c 1 6/5 was 5.9 - sliding scale insulin coverage in addition to Lantus - ADA 2100 calorie diet    5. Chronic congestive heart failure with reduced EF - stable Echocardiogram with moderate LVH and EF at 40 to45% and diffuse hypokinesis noted. Patient has had bioprosthesis for aortic valve - lasix 40 mg IV bid, -9 L fluid balance - Low salt diet   6. COPD -noevidence of exacerbation.  - Supplemental O2, goal sat 88-92% - Duonebs Q 6 hours - Continue home inhalers  7. Acute on chronic Kidney Disease 3 -renal function improved - Hold nephrotoxins, Nephrology following - Continue to monitor renal function - reduced Lyrica dose  8.  Anasarca - on Lasix 40 mg IV bid now, neg 9 L  9.weakness-PT recommends rehab, I did have a talk with her husband who is in agreement with same  10. Hyperkalemia: Resolved  Only bed offer she has is at [facility] in [place] has offered bed but they cannot take her today.  This will likely happen early next week   All the records are reviewed and case discussed with Care Management/Social Worker. Management plans discussed with the patient, nursing and they are in agreement.  CODE STATUS: Full code  TOTAL TIME TAKING CARE OF THIS PATIENT: 35 minutes.   POSSIBLE D/C IN 3-4 DAYS, DEPENDING ON CLINICAL CONDITION.  Note: This dictation was prepared with Dragon dictation along with smaller phrase technology. Any transcriptional errors that result from this process are unintentional.   Dustin Flock M.D on  11/03/2018 at 1:10 PM  Between 7am to 6pm - Pager - [phone] After 6pm go to [email or web address] - password [email or web address] [facility]  Office  [phone]  CC: Primary care physician; [place]

## 2018-11-03 NOTE — Progress Notes (Signed)
[facility], [place] 11/03/18  Subjective:  Patient resting comfortably in bed. Renal function continues to improve. Creatinine currently down to 2.34.    Objective:  Vital signs in last 24 hours:  Temp:  [98.3 F (36.8 C)-99.3 F (37.4 C)] 98.5 F (36.9 C) (10/17 1405) Pulse Rate:  [88-96] 96 (10/17 1405) Resp:  [18-20] 20 (10/17 1405) BP: (133-149)/(59-74) 141/59 (10/17 1405) SpO2:  [95 %-100 %] 99 % (10/17 1405)  Weight change:  Filed Weights   10/25/18 2215 10/29/18 1532 10/30/18 2303  Weight: 125.1 kg 126.8 kg 123.1 kg    Intake/Output:    Intake/Output Summary (Last 24 hours) at 11/03/2018 1435 Last data filed at 11/03/2018 1311 Gross per 24 hour  Intake 20 ml  Output 1850 ml  Net -1830 ml     Physical Exam: General:  Obese female, laying in the bed, no acute distress  HEENT  moist oral mucous membranes  Neck  supple  Pulm/lungs  CTAB normal effort  CVS/Heart  regular rhythm, soft systolic murmur  Abdomen:   Soft, nontender  Extremities:  2+ pitting edema over thighs and lower abdomen  Neurologic:  Alert, oriented  Skin:  No acute rashes    Basic Metabolic Panel:  Recent Labs  Lab 10/30/18 0447 10/31/18 0645 11/01/18 0546 11/02/18 0622 11/03/18 0643  NA 134* 140 140 141 141  K 5.6* 5.0 4.8 4.6 4.2  CL 107 111 108 108 106  CO2 18* 15* 21* 23 23  GLUCOSE 115* 123* 138* 122* 111*  BUN 41* 43* 42* 42* 42*  CREATININE 3.11* 3.02* 2.78* 2.71* 2.34*  CALCIUM 7.8* 8.3* 8.2* 8.2* 8.3*     CBC: Recent Labs  Lab 10/30/18 0447 10/31/18 0645 11/01/18 0546 11/01/18 1840 11/02/18 0622 11/03/18 0643  WBC 9.5 7.3 6.8  --  6.9 8.1  HGB 8.0* 7.7* 7.1* 9.0* 8.2* 8.5*  HCT 26.7* 26.6* 24.3* 31.1* 28.4* 29.1*  MCV 79.0* 81.8 81.8  --  83.8 82.4  PLT 151 179 167  --  162 161      Lab Results  Component Value Date   HEPBSAG NON REACTIVE 10/27/2018   HEPBIGM NON REACTIVE 10/27/2018      Microbiology:  Recent  Results (from the past 240 hour(s))  Blood culture (routine x 2)     Status: Abnormal   Collection Time: 10/25/18  8:38 PM   Specimen: BLOOD  Result Value Ref Range Status   Specimen Description   Final    BLOOD LEFT ANTECUBITAL Performed at [facility], [address] [place], [place] [address]    Special Requests   Final    BOTTLES DRAWN AEROBIC AND ANAEROBIC Blood Culture results may not be optimal due to an excessive volume of blood received in culture bottles Performed at [facility], [address] [place], [place] [address]    Culture  Setup Time   Final    GRAM POSITIVE COCCI IN BOTH AEROBIC AND ANAEROBIC BOTTLES CRITICAL RESULT CALLED TO, READ BACK BY AND VERIFIED WITH: CHARLES SHANLEVER @0835  ON 10/26/2018 BY FMW Performed at [facility], [address] [place], [place] [address]    Culture STREPTOCOCCUS GROUP C (A)  Final   Report Status 10/28/2018 FINAL  Final   Organism ID, Bacteria STREPTOCOCCUS GROUP C  Final      Susceptibility   Streptococcus group c - MIC*    CLINDAMYCIN <=0.25 SENSITIVE Sensitive     AMPICILLIN <=0.25 SENSITIVE Sensitive  ERYTHROMYCIN <=0.12 SENSITIVE Sensitive     VANCOMYCIN 0.5 SENSITIVE Sensitive     CEFTRIAXONE <=0.12 SENSITIVE Sensitive     LEVOFLOXACIN 0.5 SENSITIVE Sensitive     PENICILLIN Value in next row Sensitive      SENSITIVE<=0.06    * STREPTOCOCCUS GROUP C  Blood culture (routine x 2)     Status: Abnormal   Collection Time: 10/25/18  8:38 PM   Specimen: BLOOD  Result Value Ref Range Status   Specimen Description   Final    BLOOD RIGHT ANTECUBITAL Performed at [facility], [address] [place], [place] [address]    Special Requests   Final    BOTTLES DRAWN AEROBIC AND ANAEROBIC Blood Culture results may not be optimal due to an excessive volume of blood received in culture bottles Performed at [facility], [address] [place], [place] [address]    Culture   Setup Time   Final    GRAM POSITIVE COCCI IN BOTH AEROBIC AND ANAEROBIC BOTTLES CRITICAL VALUE NOTED.  VALUE IS CONSISTENT WITH PREVIOUSLY REPORTED AND CALLED VALUE. Performed at [facility], [address] [place], [place] [address]    Culture (A)  Final    STREPTOCOCCUS GROUP C SUSCEPTIBILITIES PERFORMED ON PREVIOUS CULTURE WITHIN THE LAST 5 DAYS. Performed at [facility], [address], [place], [place] [address]    Report Status 10/28/2018 FINAL  Final  Blood Culture ID Panel (Reflexed)     Status: Abnormal   Collection Time: 10/25/18  8:38 PM  Result Value Ref Range Status   Enterococcus species NOT DETECTED NOT DETECTED Final   Listeria monocytogenes NOT DETECTED NOT DETECTED Final   Staphylococcus species NOT DETECTED NOT DETECTED Final   Staphylococcus aureus (BCID) NOT DETECTED NOT DETECTED Final   Streptococcus species DETECTED (A) NOT DETECTED Final    Comment: Not Enterococcus species, Streptococcus agalactiae, Streptococcus pyogenes, or Streptococcus pneumoniae. CRITICAL RESULT CALLED TO, READ BACK BY AND VERIFIED WITH: CHARLES SHANLEVER @0835  ON 10/26/2018 BY FMW    Streptococcus agalactiae NOT DETECTED NOT DETECTED Final   Streptococcus pneumoniae NOT DETECTED NOT DETECTED Final   Streptococcus pyogenes NOT DETECTED NOT DETECTED Final   Acinetobacter baumannii NOT DETECTED NOT DETECTED Final   Enterobacteriaceae species NOT DETECTED NOT DETECTED Final   Enterobacter cloacae complex NOT DETECTED NOT DETECTED Final   Escherichia coli NOT DETECTED NOT DETECTED Final   Klebsiella oxytoca NOT DETECTED NOT DETECTED Final   Klebsiella pneumoniae NOT DETECTED NOT DETECTED Final   Proteus species NOT DETECTED NOT DETECTED Final   Serratia marcescens NOT DETECTED NOT DETECTED Final   Haemophilus influenzae NOT DETECTED NOT DETECTED Final   Neisseria meningitidis NOT DETECTED NOT DETECTED Final   Pseudomonas aeruginosa NOT DETECTED NOT DETECTED Final    Candida albicans NOT DETECTED NOT DETECTED Final   Candida glabrata NOT DETECTED NOT DETECTED Final   Candida krusei NOT DETECTED NOT DETECTED Final   Candida parapsilosis NOT DETECTED NOT DETECTED Final   Candida tropicalis NOT DETECTED NOT DETECTED Final    Comment: Performed at [facility], [address] [place], [place] [address]  SARS Coronavirus 2 by RT PCR (hospital order, performed in [place] hospital lab) Nasopharyngeal Nasopharyngeal Swab     Status: None   Collection Time: 10/25/18  8:49 PM   Specimen: Nasopharyngeal Swab  Result Value Ref Range Status   SARS Coronavirus 2 NEGATIVE NEGATIVE Final    Comment: (NOTE) If result is NEGATIVE SARS-CoV-2 target  nucleic acids are NOT DETECTED. The SARS-CoV-2 RNA is generally detectable in upper and lower  respiratory specimens during the acute phase of infection. The lowest  concentration of SARS-CoV-2 viral copies this assay can detect is 250  copies / mL. A negative result does not preclude SARS-CoV-2 infection  and should not be used as the sole basis for treatment or other  patient management decisions.  A negative result may occur with  improper specimen collection / handling, submission of specimen other  than nasopharyngeal swab, presence of viral mutation(s) within the  areas targeted by this assay, and inadequate number of viral copies  (<250 copies / mL). A negative result must be combined with clinical  observations, patient history, and epidemiological information. If result is POSITIVE SARS-CoV-2 target nucleic acids are DETECTED. The SARS-CoV-2 RNA is generally detectable in upper and lower  respiratory specimens dur ing the acute phase of infection.  Positive  results are indicative of active infection with SARS-CoV-2.  Clinical  correlation with patient history and other diagnostic information is  necessary to determine patient infection status.  Positive results do  not rule out bacterial  infection or co-infection with other viruses. If result is PRESUMPTIVE POSTIVE SARS-CoV-2 nucleic acids MAY BE PRESENT.   A presumptive positive result was obtained on the submitted specimen  and confirmed on repeat testing.  While 2019 novel coronavirus  (SARS-CoV-2) nucleic acids may be present in the submitted sample  additional confirmatory testing may be necessary for epidemiological  and / or clinical management purposes  to differentiate between  SARS-CoV-2 and other Sarbecovirus currently known to infect humans.  If clinically indicated additional testing with an alternate test  methodology [phone]) is advised. The SARS-CoV-2 RNA is generally  detectable in upper and lower respiratory sp ecimens during the acute  phase of infection. The expected result is Negative. Fact Sheet for Patients:  StrictlyIdeas.no Fact Sheet for Healthcare Providers: BankingDealers.co.za This test is not yet approved or cleared by the [place] FDA and has been authorized for detection and/or diagnosis of SARS-CoV-2 by FDA under an Emergency Use Authorization (EUA).  This EUA will remain in effect (meaning this test can be used) for the duration of the COVID-19 declaration under Section 564(b)(1) of the Act, 21 U.S.C. section 360bbb-3(b)(1), unless the authorization is terminated or revoked sooner. Performed at [facility], [address] [place], [place] [address]   MRSA PCR Screening     Status: Abnormal   Collection Time: 10/26/18 11:09 AM   Specimen: Nasopharyngeal  Result Value Ref Range Status   MRSA by PCR POSITIVE (A) NEGATIVE Final    Comment:        The GeneXpert MRSA Assay (FDA approved for NASAL specimens only), is one component of a comprehensive MRSA colonization surveillance program. It is not intended to diagnose MRSA infection nor to guide or monitor treatment for MRSA infections. RESULT CALLED TO, READ BACK BY  AND VERIFIED WITH: EMILY GANNON AT 1630 10/26/2018.PMF Performed at [facility], [address] [place],  [address]   CULTURE, BLOOD (ROUTINE X 2) w Reflex to ID Panel     Status: None   Collection Time: 10/28/18 12:48 AM   Specimen: BLOOD  Result Value Ref Range Status   Specimen Description BLOOD RIGHT ANTECUBITAL  Final   Special Requests   Final    BOTTLES DRAWN AEROBIC AND ANAEROBIC Blood Culture adequate volume   Culture   Final    NO GROWTH 5 DAYS  Performed at [facility], [address] [place], [place] [address]    Report Status 11/02/2018 FINAL  Final  CULTURE, BLOOD (ROUTINE X 2) w Reflex to ID Panel     Status: None   Collection Time: 10/28/18  5:06 AM   Specimen: BLOOD  Result Value Ref Range Status   Specimen Description BLOOD LEFT WRIST  Final   Special Requests   Final    BOTTLES DRAWN AEROBIC AND ANAEROBIC Blood Culture adequate volume   Culture   Final    NO GROWTH 5 DAYS Performed at [facility], [address], [place], [place] [address]    Report Status 11/02/2018 FINAL  Final    Coagulation Studies: No results for input(s): LABPROT, INR in the last 72 hours.  Urinalysis: No results for input(s): COLORURINE, LABSPEC, PHURINE, GLUCOSEU, HGBUR, BILIRUBINUR, KETONESUR, PROTEINUR, UROBILINOGEN, NITRITE, LEUKOCYTESUR in the last 72 hours.  Invalid input(s): APPERANCEUR    Imaging: No results found.   Medications:   . sodium chloride 250 mL (11/03/18 0554)  . ampicillin (OMNIPEN) IV 2 g (11/03/18 1256)   . sodium chloride   Intravenous Once  . atorvastatin  40 mg Oral Daily  . Chlorhexidine Gluconate Cloth  6 each Topical Daily  . fluticasone  2 spray Each Nare Daily  . folic acid  1 mg Oral Daily  . furosemide  40 mg Intravenous BID  . influenza vac split quadrivalent PF  0.5 mL Intramuscular Tomorrow-1000  . insulin aspart  0-5 Units Subcutaneous QHS  . insulin aspart  0-9 Units Subcutaneous TID WC  .  insulin glargine  10 Units Subcutaneous QHS  . magnesium citrate  1 Bottle Oral Once  . mometasone-formoterol  2 puff Inhalation BID  . pantoprazole  40 mg Oral BID  . pregabalin  75 mg Oral BID   sodium chloride, acetaminophen, albuterol  Assessment/ Plan:  63 y.o. African American female with Medical problems of poorly controlled diabetes, hepatic cirrhosis, admission for gastric ulcer in 2018, congestive heart failure, grade 2 diastolic dysfunction, concentric LVH, Chronic low back pain, obstructive sleep apnea, history of CAD/STEMI, hepatitis C 2016  #Acute kidney injury/#Chronic kidney disease stage IIIA Baseline creatinine 1.20/GFR 48 from August 29, 2018 Urinalysis from August 2 shows moderate blood, 0-5 RBCs, 0-5 WBCs.  Negative for protein.  UPEP negative for M spike in 2018 and Jan 2020. SPEP neg for M spike in 01/2018  Renal imaging in the form of CT abdomen pelvis without contrast shows bilateral perinephric stranding, no stone or obstruction. -Renal function continues to improve.  Creatinine now down to 2.34 with an EGFR of 25.  Continue to monitor renal parameters daily.   #Anemia of chronic kidney disease -Hemoglobin slowly rising and now up to 8.5.  Continue to monitor.  #Generalized edema 2D echo from August 2020 shows low normal LV EF of 50 to 55%.  Mild concentric LVH and diastolic dysfunction Patient is noted to have hypoalbuminemia -Continues to receive furosemide 40 mg IV twice daily to treat underlying edema.    LOS: 8 Lyrik Dockstader 10/17/20202:35 PM  [place], [place]  Note: This note was prepared with Dragon dictation. Any transcription errors are unintentional

## 2018-11-04 LAB — BASIC METABOLIC PANEL
Anion gap: 8 (ref 5–15)
BUN: 41 mg/dL — ABNORMAL HIGH (ref 8–23)
CO2: 25 mmol/L (ref 22–32)
Calcium: 8.2 mg/dL — ABNORMAL LOW (ref 8.9–10.3)
Chloride: 106 mmol/L (ref 98–111)
Creatinine, Ser: 2.21 mg/dL — ABNORMAL HIGH (ref 0.44–1.00)
GFR calc Af Amer: 27 mL/min — ABNORMAL LOW (ref >60)
GFR calc non Af Amer: 23 mL/min — ABNORMAL LOW (ref >60)
Glucose, Bld: 131 mg/dL — ABNORMAL HIGH (ref 70–99)
Potassium: 4 mmol/L (ref 3.5–5.1)
Sodium: 139 mmol/L (ref 135–145)

## 2018-11-04 LAB — GLUCOSE, CAPILLARY
Glucose-Capillary: 135 mg/dL — ABNORMAL HIGH (ref 70–99)
Glucose-Capillary: 153 mg/dL — ABNORMAL HIGH (ref 70–99)
Glucose-Capillary: 141 mg/dL — ABNORMAL HIGH (ref 70–99)
Glucose-Capillary: 139 mg/dL — ABNORMAL HIGH (ref 70–99)

## 2018-11-04 MED ORDER — SODIUM CHLORIDE 0.9 % IV SOLN
2.0000 g | Freq: Four times a day (QID) | INTRAVENOUS | Status: DC
  Administered 2018-11-04 – 2018-11-06 (×7): 2 g via INTRAVENOUS
  Filled 2018-11-04 (×3): qty 2
  Filled 2018-11-04: qty 2000
  Filled 2018-11-04: qty 2
  Filled 2018-11-04: qty 2000
  Filled 2018-11-04: qty 2
  Filled 2018-11-04: qty 2000
  Filled 2018-11-04 (×2): qty 2

## 2018-11-04 NOTE — Progress Notes (Signed)
Patient resting in bed. Purewick in place. No complaints at this time. Continue to monitor.

## 2018-11-04 NOTE — Progress Notes (Signed)
[place], [place] 11/04/18  Subjective:  Patient seen at bedside. Creatinine down to 2.2. Resting comfortably at the moment.  Objective:  Vital signs in last 24 hours:  Temp:  [97.6 F (36.4 C)-98.7 F (37.1 C)] 98.1 F (36.7 C) (10/18 0503) Pulse Rate:  [90-92] 90 (10/18 0503) Resp:  [16-18] 18 (10/18 0503) BP: (109-148)/(50-70) 109/50 (10/18 0503) SpO2:  [97 %-100 %] 100 % (10/18 0503)  Weight change:  Filed Weights   10/25/18 2215 10/29/18 1532 10/30/18 2303  Weight: 125.1 kg 126.8 kg 123.1 kg    Intake/Output:    Intake/Output Summary (Last 24 hours) at 11/04/2018 1444 Last data filed at 11/04/2018 1200 Gross per 24 hour  Intake 816.84 ml  Output 4075 ml  Net -3258.16 ml     Physical Exam: General:  Obese female, laying in the bed, no acute distress  HEENT  moist oral mucous membranes  Neck  supple  Pulm/lungs  CTAB normal effort  CVS/Heart  regular rhythm, soft systolic murmur  Abdomen:   Soft, nontender  Extremities:  2+ pitting edema over thighs and lower abdomen  Neurologic:  Resting comfortably.  Skin:  No acute rashes    Basic Metabolic Panel:  Recent Labs  Lab 10/31/18 0645 11/01/18 0546 11/02/18 0622 11/03/18 0643 11/04/18 0505  NA 140 140 141 141 139  K 5.0 4.8 4.6 4.2 4.0  CL 111 108 108 106 106  CO2 15* 21* _0 GLUCOSE 123* 138* 122* 111* 131*  BUN 43* 42* 42* 42* 41*  CREATININE 3.02* 2.78* 2.71* 2.34* 2.21*  CALCIUM 8.3* 8.2* 8.2* 8.3* 8.2*     CBC: Recent Labs  Lab 10/30/18 0447 10/31/18 0645 11/01/18 0546 11/01/18 1840 11/02/18 0622 11/03/18 0643  WBC 9.5 7.3 6.8  --  6.9 8.1  HGB 8.0* 7.7* 7.1* 9.0* 8.2* 8.5*  HCT 26.7* 26.6* 24.3* 31.1* 28.4* 29.1*  MCV 79.0* 81.8 81.8  --  83.8 82.4  PLT 151 179 167  --  162 161      Lab Results  Component Value Date   HEPBSAG NON REACTIVE 10/27/2018   HEPBIGM NON REACTIVE 10/27/2018      Microbiology:  Recent Results (from the  past 240 hour(s))  Blood culture (routine x 2)     Status: Abnormal   Collection Time: 10/25/18  8:38 PM   Specimen: BLOOD  Result Value Ref Range Status   Specimen Description   Final    BLOOD LEFT ANTECUBITAL Performed at [facility], [address] [place], [place] [address]    Special Requests   Final    BOTTLES DRAWN AEROBIC AND ANAEROBIC Blood Culture results may not be optimal due to an excessive volume of blood received in culture bottles Performed at [facility], [address] [place], [place] [address]    Culture  Setup Time   Final    GRAM POSITIVE COCCI IN BOTH AEROBIC AND ANAEROBIC BOTTLES CRITICAL RESULT CALLED TO, READ BACK BY AND VERIFIED WITH: CHARLES SHANLEVER _1  ON 10/26/2018 BY FMW Performed at [facility], [address], [place], [place] [address]    Culture STREPTOCOCCUS GROUP C (A)  Final   Report Status 10/28/2018 FINAL  Final   Organism ID, Bacteria STREPTOCOCCUS GROUP C  Final      Susceptibility   Streptococcus group c - MIC*    CLINDAMYCIN <=0.25 SENSITIVE Sensitive     AMPICILLIN <=0.25 SENSITIVE Sensitive     ERYTHROMYCIN <=  0.12 SENSITIVE Sensitive     VANCOMYCIN 0.5 SENSITIVE Sensitive     CEFTRIAXONE <=0.12 SENSITIVE Sensitive     LEVOFLOXACIN 0.5 SENSITIVE Sensitive     PENICILLIN Value in next row Sensitive      SENSITIVE<=0.06    * STREPTOCOCCUS GROUP C  Blood culture (routine x 2)     Status: Abnormal   Collection Time: 10/25/18  8:38 PM   Specimen: BLOOD  Result Value Ref Range Status   Specimen Description   Final    BLOOD RIGHT ANTECUBITAL Performed at [facility], [address] [place], [place] [address]    Special Requests   Final    BOTTLES DRAWN AEROBIC AND ANAEROBIC Blood Culture results may not be optimal due to an excessive volume of blood received in culture bottles Performed at [facility], [address] [place], [place] [address]    Culture  Setup Time   Final     GRAM POSITIVE COCCI IN BOTH AEROBIC AND ANAEROBIC BOTTLES CRITICAL VALUE NOTED.  VALUE IS CONSISTENT WITH PREVIOUSLY REPORTED AND CALLED VALUE. Performed at [facility], [address] [place], [place] [address]    Culture (A)  Final    STREPTOCOCCUS GROUP C SUSCEPTIBILITIES PERFORMED ON PREVIOUS CULTURE WITHIN THE LAST 5 DAYS. Performed at [facility], [place] [address], [place], [place] [address]    Report Status 10/28/2018 FINAL  Final  Blood Culture ID Panel (Reflexed)     Status: Abnormal   Collection Time: 10/25/18  8:38 PM  Result Value Ref Range Status   Enterococcus species NOT DETECTED NOT DETECTED Final   Listeria monocytogenes NOT DETECTED NOT DETECTED Final   Staphylococcus species NOT DETECTED NOT DETECTED Final   Staphylococcus aureus (BCID) NOT DETECTED NOT DETECTED Final   Streptococcus species DETECTED (A) NOT DETECTED Final    Comment: Not Enterococcus species, Streptococcus agalactiae, Streptococcus pyogenes, or Streptococcus pneumoniae. CRITICAL RESULT CALLED TO, READ BACK BY AND VERIFIED WITH: CHARLES SHANLEVER _0  ON 10/26/2018 BY FMW    Streptococcus agalactiae NOT DETECTED NOT DETECTED Final   Streptococcus pneumoniae NOT DETECTED NOT DETECTED Final   Streptococcus pyogenes NOT DETECTED NOT DETECTED Final   Acinetobacter baumannii NOT DETECTED NOT DETECTED Final   Enterobacteriaceae species NOT DETECTED NOT DETECTED Final   Enterobacter cloacae complex NOT DETECTED NOT DETECTED Final   Escherichia coli NOT DETECTED NOT DETECTED Final   Klebsiella oxytoca NOT DETECTED NOT DETECTED Final   Klebsiella pneumoniae NOT DETECTED NOT DETECTED Final   Proteus species NOT DETECTED NOT DETECTED Final   Serratia marcescens NOT DETECTED NOT DETECTED Final   Haemophilus influenzae NOT DETECTED NOT DETECTED Final   Neisseria meningitidis NOT DETECTED NOT DETECTED Final   Pseudomonas aeruginosa NOT DETECTED NOT DETECTED Final   Candida albicans NOT  DETECTED NOT DETECTED Final   Candida glabrata NOT DETECTED NOT DETECTED Final   Candida krusei NOT DETECTED NOT DETECTED Final   Candida parapsilosis NOT DETECTED NOT DETECTED Final   Candida tropicalis NOT DETECTED NOT DETECTED Final    Comment: Performed at [facility], [address] [place],  [address]  SARS Coronavirus 2 by RT PCR (hospital order, performed in [place] hospital lab) Nasopharyngeal Nasopharyngeal Swab     Status: None   Collection Time: 10/25/18  8:49 PM   Specimen: Nasopharyngeal Swab  Result Value Ref Range Status   SARS Coronavirus 2 NEGATIVE NEGATIVE Final    Comment: (NOTE) If result is NEGATIVE SARS-CoV-2 target nucleic  acids are NOT DETECTED. The SARS-CoV-2 RNA is generally detectable in upper and lower  respiratory specimens during the acute phase of infection. The lowest  concentration of SARS-CoV-2 viral copies this assay can detect is 250  copies / mL. A negative result does not preclude SARS-CoV-2 infection  and should not be used as the sole basis for treatment or other  patient management decisions.  A negative result may occur with  improper specimen collection / handling, submission of specimen other  than nasopharyngeal swab, presence of viral mutation(s) within the  areas targeted by this assay, and inadequate number of viral copies  (<250 copies / mL). A negative result must be combined with clinical  observations, patient history, and epidemiological information. If result is POSITIVE SARS-CoV-2 target nucleic acids are DETECTED. The SARS-CoV-2 RNA is generally detectable in upper and lower  respiratory specimens dur ing the acute phase of infection.  Positive  results are indicative of active infection with SARS-CoV-2.  Clinical  correlation with patient history and other diagnostic information is  necessary to determine patient infection status.  Positive results do  not rule out bacterial infection or co-infection  with other viruses. If result is PRESUMPTIVE POSTIVE SARS-CoV-2 nucleic acids MAY BE PRESENT.   A presumptive positive result was obtained on the submitted specimen  and confirmed on repeat testing.  While 2019 novel coronavirus  (SARS-CoV-2) nucleic acids may be present in the submitted sample  additional confirmatory testing may be necessary for epidemiological  and / or clinical management purposes  to differentiate between  SARS-CoV-2 and other Sarbecovirus currently known to infect humans.  If clinically indicated additional testing with an alternate test  methodology [phone]) is advised. The SARS-CoV-2 RNA is generally  detectable in upper and lower respiratory sp ecimens during the acute  phase of infection. The expected result is Negative. Fact Sheet for Patients:  StrictlyIdeas.no Fact Sheet for Healthcare Providers: BankingDealers.co.za This test is not yet approved or cleared by the [place] FDA and has been authorized for detection and/or diagnosis of SARS-CoV-2 by FDA under an Emergency Use Authorization (EUA).  This EUA will remain in effect (meaning this test can be used) for the duration of the COVID-19 declaration under Section 564(b)(1) of the Act, 21 U.S.C. section 360bbb-3(b)(1), unless the authorization is terminated or revoked sooner. Performed at [facility], [address] [place], [place] [address]   MRSA PCR Screening     Status: Abnormal   Collection Time: 10/26/18 11:09 AM   Specimen: Nasopharyngeal  Result Value Ref Range Status   MRSA by PCR POSITIVE (A) NEGATIVE Final    Comment:        The GeneXpert MRSA Assay (FDA approved for NASAL specimens only), is one component of a comprehensive MRSA colonization surveillance program. It is not intended to diagnose MRSA infection nor to guide or monitor treatment for MRSA infections. RESULT CALLED TO, READ BACK BY AND VERIFIED WITH: EMILY  GANNON AT 1630 10/26/2018.PMF Performed at [facility], [address] [place], [place] [address]   CULTURE, BLOOD (ROUTINE X 2) w Reflex to ID Panel     Status: None   Collection Time: 10/28/18 12:48 AM   Specimen: BLOOD  Result Value Ref Range Status   Specimen Description BLOOD RIGHT ANTECUBITAL  Final   Special Requests   Final    BOTTLES DRAWN AEROBIC AND ANAEROBIC Blood Culture adequate volume   Culture   Final    NO GROWTH 5 DAYS Performed  at [facility], [address] [place], [place] [address]    Report Status 11/02/2018 FINAL  Final  CULTURE, BLOOD (ROUTINE X 2) w Reflex to ID Panel     Status: None   Collection Time: 10/28/18  5:06 AM   Specimen: BLOOD  Result Value Ref Range Status   Specimen Description BLOOD LEFT WRIST  Final   Special Requests   Final    BOTTLES DRAWN AEROBIC AND ANAEROBIC Blood Culture adequate volume   Culture   Final    NO GROWTH 5 DAYS Performed at [facility], [address] [place], [place] [address]    Report Status 11/02/2018 FINAL  Final    Coagulation Studies: No results for input(s): LABPROT, INR in the last 72 hours.  Urinalysis: No results for input(s): COLORURINE, LABSPEC, PHURINE, GLUCOSEU, HGBUR, BILIRUBINUR, KETONESUR, PROTEINUR, UROBILINOGEN, NITRITE, LEUKOCYTESUR in the last 72 hours.  Invalid input(s): APPERANCEUR    Imaging: No results found.   Medications:   . sodium chloride 10 mL/hr at 11/04/18 1200  . ampicillin (OMNIPEN) IV 2 g (11/04/18 1415)   . sodium chloride   Intravenous Once  . atorvastatin  40 mg Oral Daily  . Chlorhexidine Gluconate Cloth  6 each Topical Daily  . fluticasone  2 spray Each Nare Daily  . folic acid  1 mg Oral Daily  . furosemide  40 mg Intravenous BID  . insulin aspart  0-5 Units Subcutaneous QHS  . insulin aspart  0-9 Units Subcutaneous TID WC  . insulin glargine  10 Units Subcutaneous QHS  . magnesium citrate  1 Bottle Oral Once  .  mometasone-formoterol  2 puff Inhalation BID  . pantoprazole  40 mg Oral BID  . pregabalin  75 mg Oral BID   sodium chloride, acetaminophen, albuterol  Assessment/ Plan:  63 y.o. African American female with Medical problems of poorly controlled diabetes, hepatic cirrhosis, admission for gastric ulcer in 2018, congestive heart failure, grade 2 diastolic dysfunction, concentric LVH, Chronic low back pain, obstructive sleep apnea, history of CAD/STEMI, hepatitis C 2016  #Acute kidney injury/#Chronic kidney disease stage IIIA Baseline creatinine 1.20/GFR 48 from August 29, 2018 Urinalysis from August 2 shows moderate blood, 0-5 RBCs, 0-5 WBCs.  Negative for protein.  UPEP negative for M spike in 2018 and Jan 2020. SPEP neg for M spike in 01/2018  Renal imaging in the form of CT abdomen pelvis without contrast shows bilateral perinephric stranding, no stone or obstruction. -Renal function is improving albeit slowly.  Creatinine now down to 2.21 with an EGFR 27.  Continue to periodically monitor renal function.   #Anemia of chronic kidney disease -Most recent hemoglobin was 8.5.  Continue to periodically monitor CBC.  #Generalized edema 2D echo from August 2020 shows low normal LV EF of 50 to 55%.  Mild concentric LVH and diastolic dysfunction Patient is noted to have hypoalbuminemia -Overall edema has improved over the course of the admission.  Okay to maintain the patient on furosemide 40 mg IV twice daily but consider changing this to p.o. in the relative near future.    LOS: 9 Dixon Luczak 10/18/20202:44 PM  [place], [place]  Note: This note was prepared with Dragon dictation. Any transcription errors are unintentional

## 2018-11-04 NOTE — Progress Notes (Signed)
[place] at [place] NAME: [name]    MR#:  [id number]  DATE OF BIRTH:  [birth year]  SUBJECTIVE:   Pt denies any complatins      REVIEW OF SYSTEMS:  CONSTITUTIONAL: No fever, reports fatigue or weakness.  EYES: No blurred or double vision.  EARS, NOSE, AND THROAT: No tinnitus or ear pain.  RESPIRATORY: No cough, shortness of breath, wheezing or hemoptysis.  CARDIOVASCULAR: No chest pain, orthopnea, edema.  GASTROINTESTINAL: No nausea, vomiting, diarrhea or abdominal pain.  GENITOURINARY: No dysuria, hematuria.  ENDOCRINE: No polyuria, nocturia,  HEMATOLOGY: No anemia, easy bruising or bleeding SKIN: No rash or lesion. MUSCULOSKELETAL: No joint pain or arthritis.   NEUROLOGIC: No tingling, numbness, weakness.  PSYCHIATRY: No anxiety or depression.  DRUG ALLERGIES:   Allergies  Allergen Reactions  . Latex Anaphylaxis and Rash  . Gabapentin Nausea And Vomiting and Other (See Comments)    VITALS:  Blood pressure (!) 109/50, pulse 90, temperature 98.1 F (36.7 C), temperature source Oral, resp. rate 18, height 5' 8"  (1.727 m), weight 123.1 kg, SpO2 100 %. PHYSICAL EXAMINATION:  GENERAL:  63 y.o.-year-old patient lying in the bed with no acute distress.  EYES: Pupils equal, round, reactive to light and accommodation. No scleral icterus. Extraocular muscles intact.  HEENT: Head atraumatic, normocephalic. Oropharynx and nasopharynx clear.  NECK:  Supple, no jugular venous distention. No thyroid enlargement, no tenderness.  LUNGS: Normal breath sounds bilaterally, no wheezing, rales,rhonchi or crepitation. No use of accessory muscles of respiration.  CARDIOVASCULAR: S1, S2 normal. No murmurs, rubs, or gallops.  ABDOMEN: Soft, nontender, nondistended. Bowel sounds present.  EXTREMITIES: No pedal edema, cyanosis, or clubbing.  NEUROLOGIC: Patient is arousable but sleepy sensation intact. Gait not checked.  PSYCHIATRIC:  The patient is sleepy SKIN: No obvious rash, lesion, or ulcer.  LABORATORY PANEL:   CBC Recent Labs  Lab 11/03/18 0643  WBC 8.1  HGB 8.5*  HCT 29.1*  PLT 161   ------------------------------------------------------------------------------------------------------------------  Chemistries  Recent Labs  Lab 11/04/18 0505  NA 139  K 4.0  CL 106  CO2 25  GLUCOSE 131*  BUN 41*  CREATININE 2.21*  CALCIUM 8.2*   ------------------------------------------------------------------------------------------------------------------  Cardiac Enzymes No results for input(s): TROPONINI in the last 168 hours. ------------------------------------------------------------------------------------------------------------------  RADIOLOGY:  No results found.  EKG:   Orders placed or performed during the hospital encounter of 10/25/18  . EKG 12-Lead  . EKG 12-Lead  . EKG 12-Lead  . EKG 12-Lead  . EKG 12-Lead  . EKG 12-Lead  . EKG 12-Lead  . EKG 12-Lead    ASSESSMENT AND PLAN:  63 y.o. 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 Delana Meyer, and CKD to the ED with worsening shortness of breath and fall.  1. Sepsis with multiorgan failure -sepsis present on admission - Covid negative - Likely urinary source - Blood cultures strep group C in 4 out of 4 bottles - continue ampicillin for 2 weeks per ID, per ID we may be able to switch her to oral antibiotic at discharge - TEE neg for any valve vegetations  2.  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  Status post push enteroscopy showing no active bleeding, gave 1 dose of IV iron yesterday on 10/15  3. Elevated troponin -due to demand ischemia  4. DM: sugars have been well-controlled on current regimen  - hemoglobin A1c 1 6/5 was 5.9 - sliding scale insulin coverage in addition to Lantus - ADA 2100 calorie diet    5. Chronic congestive heart failure with reduced EF - stable Echocardiogram with moderate LVH and EF at 40 to45% and diffuse hypokinesis noted. Patient has had bioprosthesis for aortic valve - lasix 40 mg IV bid, -9 L fluid balance - Low salt diet   6. COPD -noevidence of exacerbation.  - Supplemental O2, goal sat 88-92% - Duonebs Q 6 hours - Continue home inhalers  7. Acute on chronic Kidney Disease 3 -renal function improved - Hold nephrotoxins, Nephrology following - Continue to monitor renal function - reduced Lyrica dose  8.  Anasarca - on Lasix 40 mg IV bid now, neg 9 L  9.weakness-PT recommends rehab, I did have a talk with her husband who is in agreement with same  10. Hyperkalemia: Resolved  Only bed offer she has is at [facility] in [place] has offered bed but they cannot take her today.  This will likely happen early next week   All the records are reviewed and case discussed with Care Management/Social Worker. Management plans discussed with the patient, nursing and they are in agreement.  CODE STATUS: Full code  TOTAL TIME TAKING CARE OF THIS PATIENT: 35 minutes.   POSSIBLE D/C IN 3-4 DAYS, DEPENDING ON CLINICAL CONDITION.  Note: This dictation was prepared with Dragon dictation along with smaller phrase technology. Any transcriptional errors that result from this process are unintentional.   Dustin Flock M.D on  11/04/2018 at 11:52 AM  Between 7am to 6pm - Pager - [phone] After 6pm go to [email or web address] - password [email or web address] [facility]  Office  [phone]  CC: Primary care physician; [place]

## 2018-11-05 ENCOUNTER — Telehealth: Payer: Self-pay | Admitting: Gastroenterology

## 2018-11-05 LAB — GLUCOSE, CAPILLARY
Glucose-Capillary: 128 mg/dL — ABNORMAL HIGH (ref 70–99)
Glucose-Capillary: 178 mg/dL — ABNORMAL HIGH (ref 70–99)
Glucose-Capillary: 173 mg/dL — ABNORMAL HIGH (ref 70–99)
Glucose-Capillary: 233 mg/dL — ABNORMAL HIGH (ref 70–99)

## 2018-11-05 MED ORDER — AMOXICILLIN-POT CLAVULANATE 875-125 MG PO TABS: 1.0000 | ORAL_TABLET | Freq: Two times a day (BID) | ORAL | 0 refills | Status: DC

## 2018-11-05 MED ORDER — LANTUS SOLOSTAR 100 UNIT/ML ~~LOC~~ SOPN: 10.0000 [IU] | PEN_INJECTOR | Freq: Every day | SUBCUTANEOUS | 0 refills | Status: DC

## 2018-11-05 NOTE — Discharge Summary (Addendum)
[facility] [place], 63 y.o., DOB 24-Apr-1955, MRN [id number]. 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 Delana [name], and CKD to the ED with worsening shortness of breath and fall.  Patient very lethargic unable to provide history, history therefore obtained from patient's chart.  Per ED report patient arrived via EMS from home.  She states she was sitting on toilet and while attempting to get up fell on the floor.  Denies hitting head or loss of consciousness.  Patient states she started feeling very short of breath, per EMS oxygen saturation 88% on room air.  She was also complaining of abdominal pain without nausea or vomiting, hematuria, hemoptysis, hematochezia, melena or any other GI related symptoms.  Patient denies fevers or chills or recent sick contacts.  On arrival to the ED, she was febrile with temp 100.0 with blood pressure 84/55 mm Hg and pulse rate 102  beats/min. There were no focal neurological deficits; she was alert and oriented x4, and appeared lethargic.  Initial labs revealed WBC 14, hemoglobin 5.6, hematocrit 20.4, MCV 78.5, creatinine 1.85 slightly above baseline, glucose 191, troponin 89, lactic acid 3.9 COVID 19-.  Chest x-ray did not show any active cardiopulmonary process.  Hospitalist asked to admit for further management.  Patient was admitted and started on antibiotics there was some concern for sepsis.  Her blood cultures did come back positive.  She was seen in consultation by infectious disease who recommended IV ampicillin, per ID no further antibiotics are needed patient has not had any further fevers or chills.  She also had acute on chronic renal failure that is currently stable.  Patient is very weak and deconditioned need of rehab.           Consults  nephrology, id  Significant Tests:  See full reports for all details     Ct Abdomen Pelvis Wo Contrast  Result Date: 10/26/2018 CLINICAL DATA:  Abdominal pain, gastroenteritis or colitis EXAM: CT ABDOMEN AND PELVIS WITHOUT CONTRAST TECHNIQUE: Multidetector CT imaging of the abdomen and pelvis was performed following the standard protocol without IV contrast. COMPARISON:  June 21, 2018 FINDINGS: Lower chest: There is moderate cardiomegaly. Small bilateral pleural effusions are seen, right greater than left with peripheral atelectasis. Hepatobiliary: Although limited due to the lack of intravenous contrast, normal in appearance without gross focal abnormality. The patient is status post cholecystectomy. No biliary ductal dilation. Pancreas:  Unremarkable.  No surrounding inflammatory changes. Spleen: Normal in size. Although limited due to the lack of intravenous contrast, normal in appearance. Adrenals/Urinary Tract: Both adrenal glands appear normal. Again noted is a 6 cm low-density lesion lower pole of the left  kidney brought to pull left-sided low-density lesions are  seen. Bilateral perinephric stranding is seen. This appears to be more prominent than the prior exam, and is asymmetric, right greater than left. Stomach/Bowel: The stomach and small bowel are normal in size and contour. There is a moderate amount of colonic stool. Scattered colonic diverticula are noted without diverticulitis. Vascular/Lymphatic: There are no enlarged abdominal or pelvic lymph nodes. Scattered aortic atherosclerotic calcifications are seen without aneurysmal dilatation. Reproductive: The uterus and adnexa are unremarkable. Other: There is diffuse extensive anasarca seen. There is a small fat containing anterior umbilical hernia. A small amount of perihepatic ascites is seen. Musculoskeletal: No acute or significant osseous findings. IMPRESSION: 1. Diffuse extensive anasarca. 2. Small bilateral pleural effusions, slightly increased in size from the prior exam. 3. Small amount of perihepatic ascites. 4. Bilateral perinephric stranding, right greater than left, increased from the prior exam. This could be due to acute pyelonephritis. 5. Colonic diverticula without diverticulitis. Electronically Signed   By: [name] M.D.   On: 10/26/2018 03:12   Dg Abd 1 View  Result Date: 10/31/2018 CLINICAL DATA:  Ileus EXAM: ABDOMEN - 1 VIEW COMPARISON:  Yesterday FINDINGS: No dilated bowel. Moderate stool intermittently seen along the colon. No concerning mass effect or calcification. High-density material over the pelvis was not seen on abdominal CT from 4 days ago and has progressed by radiography. IMPRESSION: 1. Nonobstructive bowel gas pattern. 2. Moderate stool volume. 3. A high-density structure is progressing through the colon, now at the rectum. Is there a missing dental cap? Electronically Signed   By: [name] M.D.   On: 10/31/2018 05:36   Dg Abd 1 View  Result Date: 10/30/2018 CLINICAL DATA:  Pulmonary disease.  Possible ileus. EXAM: ABDOMEN - 1 VIEW COMPARISON:  CT 10/26/2018.  FINDINGS: Prior median sternotomy, CABG, and cardiac valve replacement. Surgical clips right upper quadrant. Air-filled loops of small and large bowel noted. These are nondilated. Follow-up exam suggested to exclude developing bowel distention. No free air. Metallic density of unknown etiology noted over the left abdomen. Degenerative changes thoracolumbar spine and both hips. IMPRESSION: Air-filled loops of small large bowel noted. These are nondilated. Follow-up exam suggest to exclude developing bowel distention. Electronically Signed   By: Marcello Moores  Register   On: 10/30/2018 09:57   Dg Chest Port 1 View  Result Date: 10/31/2018 CLINICAL DATA:  Acute respiratory failure EXAM: PORTABLE CHEST 1 VIEW COMPARISON:  Yesterday FINDINGS: Chronic cardiomegaly. 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 Vascular congestion. Mild reticulation in the left mid lung, likely scarring. There is no edema, consolidation, effusion, or pneumothorax. IMPRESSION: Cardiomegaly and vascular congestion. Electronically Signed   By: [name] M.D.   On: 10/31/2018 05:34   Dg Chest Port 1 View  Result Date: 10/30/2018 CLINICAL DATA:  Pulmonary disease.  Possible ileus. EXAM: PORTABLE CHEST 1 VIEW COMPARISON:  CT 10/26/2018.  Chest x-ray 10/25/2018. FINDINGS: Prior median sternotomy, CABG, and cardiac valve replacement. Stable cardiomegaly. Low lung volumes. Stay chronic bilateral mild interstitial prominence. No focal alveolar infiltrate. No pleural effusion or pneumothorax. IMPRESSION: 1. Prior median sternotomy, CABG, and cardiac valve replacement. Stable cardiomegaly. 2. Low lung volumes with stable chronic bilateral mild interstitial prominence. No acute alveolar infiltrate noted. Electronically Signed   By: Marcello Moores  Register   On: 10/30/2018 09:55   Dg Chest Portable 1 View  Result Date: 10/25/2018 CLINICAL DATA:  Fall EXAM: PORTABLE CHEST 1 VIEW COMPARISON:  08/29/2018 FINDINGS: Mild cardiomegaly with  prosthetic valve  and left atrial closure device. No pulmonary edema or focal airspace consolidation. No pleural effusion or pneumothorax. IMPRESSION: No active cardiopulmonary disease. Electronically Signed   By: Ulyses Jarred M.D.   On: 10/25/2018 21:14   US Abdomen Limited Ruq  Result Date: 10/27/2018 CLINICAL DATA:  Cirrhosis EXAM: ULTRASOUND ABDOMEN LIMITED RIGHT UPPER QUADRANT COMPARISON:  Abdominal ultrasound 12/19/2016, CT abdomen pelvis 10/26/2018 FINDINGS: Gallbladder: Surgically absent. Common bile duct: Diameter: 0.3 cm Liver: No focal lesion identified. The parenchymal echotexture is mildly heterogeneous with mildly nodular contour in keeping with history of cirrhosis. Portal vein is patent on color Doppler imaging with normal direction of blood flow towards the liver. Other: Probable small right pleural effusion. IMPRESSION: 1. Appearance of the liver compatible with cirrhosis. No focal liver lesion identified. 2.  Probable small right pleural effusion. Electronically Signed   By: Audie Pinto M.D.   On: 10/27/2018 11:40       Today   Subjective:   [name] doing well denies any complaints Objective:   Blood pressure (!) 146/64, pulse 81, temperature 97.9 F (36.6 C), temperature source Oral, resp. rate 18, height 5' 8"  (1.727 m), weight 112.3 kg, SpO2 99 %.  .  Intake/Output Summary (Last 24 hours) at 11/06/2018 1044 Last data filed at 11/06/2018 0900 Gross per 24 hour  Intake 1040 ml  Output 2000 ml  Net -960 ml    Exam VITAL SIGNS: Blood pressure (!) 146/64, pulse 81, temperature 97.9 F (36.6 C), temperature source Oral, resp. rate 18, height 5' 8"  (1.727 m), weight 112.3 kg, SpO2 99 %.  GENERAL:  63 y.o.-year-old patient lying in the bed with no acute distress.  EYES: Pupils equal, round, reactive to light and accommodation. No scleral icterus. Extraocular muscles intact.  HEENT: Head atraumatic, normocephalic. Oropharynx and nasopharynx clear.  NECK:   Supple, no jugular venous distention. No thyroid enlargement, no tenderness.  LUNGS: Normal breath sounds bilaterally, no wheezing, rales,rhonchi or crepitation. No use of accessory muscles of respiration.  CARDIOVASCULAR: S1, S2 normal. No murmurs, rubs, or gallops.  ABDOMEN: Soft, nontender, nondistended. Bowel sounds present. No organomegaly or mass.  EXTREMITIES: No pedal edema, cyanosis, or clubbing.  NEUROLOGIC: Cranial nerves II through XII are intact. Muscle strength 5/5 in all extremities. Sensation intact. Gait not checked.  PSYCHIATRIC: The patient is alert and oriented x 3.  SKIN: No obvious rash, lesion, or ulcer.   Data Review     CBC w Diff:  Lab Results  Component Value Date   WBC 8.1 11/03/2018   HGB 8.5 (L) 11/03/2018   HGB 7.3 (L) 11/23/2017   HCT 29.1 (L) 11/03/2018   HCT 45.7 08/03/2013   PLT 161 11/03/2018   PLT 118 (L) 08/03/2013   LYMPHOPCT 4 10/27/2018   LYMPHOPCT 30.9 08/03/2013   MONOPCT 7 10/27/2018   MONOPCT 6.7 08/03/2013   EOSPCT 1 10/27/2018   EOSPCT 3.0 08/03/2013   BASOPCT 0 10/27/2018   BASOPCT 0.7 08/03/2013   CMP:  Lab Results  Component Value Date   NA 139 11/06/2018   NA 139 08/03/2013   K 3.8 11/06/2018   K 3.4 (L) 08/03/2013   CL 103 11/06/2018   CL 104 08/03/2013   CO2 27 11/06/2018   CO2 27 08/03/2013   BUN 40 (H) 11/06/2018   BUN 16 08/03/2013   CREATININE 1.92 (H) 11/06/2018   CREATININE 1.19 08/03/2013   PROT 7.0 10/27/2018   PROT 9.3 (H) 03/12/2012   ALBUMIN 2.3 (L) 11/06/2018  ALBUMIN 3.7 03/12/2012   BILITOT 2.5 (H) 10/27/2018   BILITOT 0.5 03/12/2012   ALKPHOS 74 10/27/2018   ALKPHOS 199 (H) 03/12/2012   AST 17 10/27/2018   AST 40 (H) 03/12/2012   ALT 9 10/27/2018   ALT 34 03/12/2012  .  Micro Results Recent Results (from the past 240 hour(s))  CULTURE, BLOOD (ROUTINE X 2) w Reflex to ID Panel     Status: None   Collection Time: 10/28/18 12:48 AM   Specimen: BLOOD  Result Value Ref Range Status    Specimen Description BLOOD RIGHT ANTECUBITAL  Final   Special Requests   Final    BOTTLES DRAWN AEROBIC AND ANAEROBIC Blood Culture adequate volume   Culture   Final    NO GROWTH 5 DAYS Performed at [facility], [address], [place], [place] [address]    Report Status 11/02/2018 FINAL  Final  CULTURE, BLOOD (ROUTINE X 2) w Reflex to ID Panel     Status: None   Collection Time: 10/28/18  5:06 AM   Specimen: BLOOD  Result Value Ref Range Status   Specimen Description BLOOD LEFT WRIST  Final   Special Requests   Final    BOTTLES DRAWN AEROBIC AND ANAEROBIC Blood Culture adequate volume   Culture   Final    NO GROWTH 5 DAYS Performed at [facility], [address], [place], [place] [address]    Report Status 11/02/2018 FINAL  Final        Code Status Orders  (From admission, onward)         Start     Ordered   10/26/18 0046  Full code  Continuous     10/26/18 0048        Code Status History    Date Active Date Inactive Code Status Order ID Comments User Context   08/17/2018 1820 08/22/2018 1805 Full Code [id number]  Dustin Flock, MD Inpatient   06/20/2018 2312 06/27/2018 2119 Full Code [id number]  Mayer Camel, NP ED   01/13/2018 1540 01/16/2018 1859 Full Code [id number]  Loletha Grayer, MD ED   11/03/2017 1758 11/06/2017 2136 Full Code [id number]  Salary, Avel Peace, MD Inpatient   09/26/2017 2353 09/30/2017 1404 Full Code [id number]  Lance Coon, MD Inpatient   08/27/2017 0347 08/29/2017 2054 Full Code [id number]  Harrie Foreman, MD Inpatient   08/07/2017 1704 08/09/2017 2237 Full Code [id number]  Saundra Shelling, MD Inpatient   12/18/2016 1146 12/26/2016 2032 Full Code [id number]  Vaughan Basta, MD Inpatient   12/18/2016 0959 12/18/2016 1146 Full Code [id number]  Vaughan Basta, MD ED   12/04/2015 0509 12/07/2015 1756 Full Code [id number]  Holley Raring, NP ED   10/28/2015 1640 10/29/2015 1711 Full Code [id number]  Bettey Costa, MD  Inpatient   08/29/2015 1438 08/30/2015 1750 Full Code [id number]  Harvie Bridge, DO Inpatient   Advance Care Planning Activity          Contact information for after-discharge care    Destination    [facility] .   Service: Skilled Nursing Contact information: [place] [phone]              Discharge Medications   Allergies as of 11/06/2018      Reactions   Latex Anaphylaxis, Rash   Gabapentin Nausea And Vomiting, Other (See Comments)      Medication List    STOP taking these medications   lisinopril 5 MG tablet  Commonly known as: ZESTRIL   omeprazole 40 MG capsule Commonly known as: PriLOSEC   thiamine 100 MG tablet     TAKE these medications   atorvastatin 40 MG tablet Commonly known as: LIPITOR Take 40 mg by mouth daily.   carvedilol 3.125 MG tablet Commonly known as: COREG Take 1 tablet (3.125 mg total) by mouth 2 (two) times daily with a meal.   ferrous sulfate 325 (65 FE) MG tablet Take 1 tablet (325 mg total) by mouth 2 (two) times daily with a meal.   fluticasone 50 MCG/ACT nasal spray Commonly known as: FLONASE Place 2 sprays into both nostrils daily.   folic acid 1 MG tablet Commonly known as: FOLVITE Take 1 tablet (1 mg total) by mouth daily.   furosemide 40 MG tablet Commonly known as: LASIX Take 1 tablet (40 mg total) by mouth 2 (two) times daily. What changed: when to take this   Lantus SoloStar 100 UNIT/ML Solostar Pen Generic drug: Insulin Glargine Inject 10 Units into the skin daily. What changed: how much to take   multivitamin with minerals Tabs tablet Take 1 tablet by mouth daily.   pantoprazole 40 MG tablet Commonly known as: PROTONIX Take 40 mg by mouth 2 (two) times daily.   pregabalin 150 MG capsule Commonly known as: LYRICA Take 150 mg by mouth 2 (two) times daily.   ProAir HFA 108 (90 Base) MCG/ACT inhaler Generic drug: albuterol Inhale 1-2 puffs  into the lungs every 4 (four) hours as needed.   spironolactone 25 MG tablet Commonly known as: ALDACTONE Take 12.5 mg by mouth daily.   Symbicort 160-4.5 MCG/ACT inhaler Generic drug: budesonide-formoterol Inhale 2 puffs into the lungs 2 (two) times daily.   torsemide 20 MG tablet Commonly known as: DEMADEX Take 20 mg by mouth daily. Take 2 tablets if weight is greater than 185 lbs. NO tablets if weight is less than 177 lbs.          Total Time in preparing paper work, data evaluation and todays exam - 21 minutes  Dustin Flock M.D on 11/06/2018 at 10:44 AM [place]  [phone]

## 2018-11-05 NOTE — TOC Progression Note (Addendum)
Transition of Care [facility]) - Progression Note    Patient Details  Name: SPENCER PETERKIN MRN: [id number] Date of Birth: 01-21-1955  Transition of Care [facility]) CM/SW [place], LCSW Phone Number: 11/05/2018, 10:23 AM  Clinical Narrative: Left voicemail for [facility] admissions coordinator.    1:31 pm: [facility] will not have a bed today but is working on two discharges. This is patient's only bed offer. Admissions coordinator will follow up once she has a better idea of when a Medicaid bed will be available.  Expected Discharge Plan: Skilled Nursing Facility Barriers to Discharge: SNF Pending bed offer, Continued Medical Work up  Expected Discharge Plan and Services Expected Discharge Plan: [place] arrangements for the past 2 months: Single Family Home                                       Social Determinants of Health (SDOH) Interventions    Readmission Risk Interventions Readmission Risk Prevention Plan 10/26/2018 08/21/2018 09/30/2017  Transportation Screening Complete Complete Complete  PCP or Specialist Appt within 5-7 Days - - Complete  Home Care Screening - - Complete  Medication Review (RN CM) - - Complete  HRI or Home Care Consult Complete Complete -  Social Work Consult for [place] Planning/Counseling Not Complete - -  SW consult not completed comments not recovery care - -  Palliative Care Screening Not Applicable Not Applicable -  Medication Review (RN Care Manager) Referral to Pharmacy Complete -  [place] or Home Care Consult Complete - -  Palliative Care Screening Not Applicable - -  Some recent data might be hidden

## 2018-11-05 NOTE — Progress Notes (Signed)
[facility]  ROUNDING NOTE   Subjective:   Sitting in chair. She states her edema is improving.   Objective:  Vital signs in last 24 hours:  Temp:  [98.1 F (36.7 C)-98.7 F (37.1 C)] 98.7 F (37.1 C) (10/19 0513) Pulse Rate:  [87-91] 91 (10/19 0513) Resp:  [17-20] 17 (10/19 0513) BP: (114-131)/(58-63) 131/63 (10/19 0513) SpO2:  [97 %-100 %] 99 % (10/19 0513)  Weight change:  Filed Weights   10/25/18 2215 10/29/18 1532 10/30/18 2303  Weight: 125.1 kg 126.8 kg 123.1 kg    Intake/Output: I/O last 3 completed shifts: In: 971 [I.V.:170.3; IV Piggyback:800.7] Out: 5650 [Urine:5650]   Intake/Output this shift:  Total I/O In: 480 [P.O.:480] Out: 1600 [Urine:1600]  Physical Exam: General: NAD,   Head: Normocephalic, atraumatic. Moist oral mucosal membranes  Eyes: Anicteric, PERRL  Neck: Supple, trachea midline  Lungs:  Clear to auscultation  Heart: Regular rate and rhythm  Abdomen:  Soft, nontender,   Extremities:  1+ peripheral edema.  Neurologic: Nonfocal, moving all four extremities           Basic Metabolic Panel: Recent Labs  Lab 10/31/18 0645 11/01/18 0546 11/02/18 0622 11/03/18 0643 11/04/18 0505  NA 140 140 141 141 139  K 5.0 4.8 4.6 4.2 4.0  CL 111 108 108 106 106  CO2 15* 21* 23 23 25   GLUCOSE 123* 138* 122* 111* 131*  BUN 43* 42* 42* 42* 41*  CREATININE 3.02* 2.78* 2.71* 2.34* 2.21*  CALCIUM 8.3* 8.2* 8.2* 8.3* 8.2*    Liver Function Tests: No results for input(s): AST, ALT, ALKPHOS, BILITOT, PROT, ALBUMIN in the last 168 hours. No results for input(s): LIPASE, AMYLASE in the last 168 hours. No results for input(s): AMMONIA in the last 168 hours.  CBC: Recent Labs  Lab 10/30/18 0447 10/31/18 0645 11/01/18 0546 11/01/18 1840 11/02/18 0622 11/03/18 0643  WBC 9.5 7.3 6.8  --  6.9 8.1  HGB 8.0* 7.7* 7.1* 9.0* 8.2* 8.5*  HCT 26.7* 26.6* 24.3* 31.1* 28.4* 29.1*  MCV 79.0* 81.8 81.8  --  83.8 82.4  PLT 151 179 167  --  162  161    Cardiac Enzymes: No results for input(s): CKTOTAL, CKMB, CKMBINDEX, TROPONINI in the last 168 hours.  BNP: Invalid input(s): POCBNP  CBG: Recent Labs  Lab 11/04/18 1154 11/04/18 1632 11/04/18 2028 11/05/18 0800 11/05/18 1136  GLUCAP 153* 141* 139* 128* 178*    Microbiology: Results for orders placed or performed during the hospital encounter of 10/25/18  Blood culture (routine x 2)     Status: Abnormal   Collection Time: 10/25/18  8:38 PM   Specimen: BLOOD  Result Value Ref Range Status   Specimen Description   Final    BLOOD LEFT ANTECUBITAL Performed at [facility], [name] [place], [place] [address]    Special Requests   Final    BOTTLES DRAWN AEROBIC AND ANAEROBIC Blood Culture results may not be optimal due to an excessive volume of blood received in culture bottles Performed at [facility], [address], [name], [place] [address]    Culture  Setup Time   Final    GRAM POSITIVE COCCI IN BOTH AEROBIC AND ANAEROBIC BOTTLES CRITICAL RESULT CALLED TO, READ BACK BY AND VERIFIED WITH: CHARLES SHANLEVER @0835  ON 10/26/2018 BY FMW Performed at [facility], [place] [address] [place], [place] [address]    Culture STREPTOCOCCUS GROUP C (A)  Final   Report Status 10/28/2018  FINAL  Final   Organism ID, Bacteria STREPTOCOCCUS GROUP C  Final      Susceptibility   Streptococcus group c - MIC*    CLINDAMYCIN <=0.25 SENSITIVE Sensitive     AMPICILLIN <=0.25 SENSITIVE Sensitive     ERYTHROMYCIN <=0.12 SENSITIVE Sensitive     VANCOMYCIN 0.5 SENSITIVE Sensitive     CEFTRIAXONE <=0.12 SENSITIVE Sensitive     LEVOFLOXACIN 0.5 SENSITIVE Sensitive     PENICILLIN Value in next row Sensitive      SENSITIVE<=0.06    * STREPTOCOCCUS GROUP C  Blood culture (routine x 2)     Status: Abnormal   Collection Time: 10/25/18  8:38 PM   Specimen: BLOOD  Result Value Ref Range Status   Specimen Description   Final    BLOOD RIGHT  ANTECUBITAL Performed at [facility], [address] [place], [place] [address]    Special Requests   Final    BOTTLES DRAWN AEROBIC AND ANAEROBIC Blood Culture results may not be optimal due to an excessive volume of blood received in culture bottles Performed at [facility], [address] [place], [place] [address]    Culture  Setup Time   Final    GRAM POSITIVE COCCI IN BOTH AEROBIC AND ANAEROBIC BOTTLES CRITICAL VALUE NOTED.  VALUE IS CONSISTENT WITH PREVIOUSLY REPORTED AND CALLED VALUE. Performed at [facility], [address] [place], [place] [address]    Culture (A)  Final    STREPTOCOCCUS GROUP C SUSCEPTIBILITIES PERFORMED ON PREVIOUS CULTURE WITHIN THE LAST 5 DAYS. Performed at [facility], [place] [address], [place], [place] [address]    Report Status 10/28/2018 FINAL  Final  Blood Culture ID Panel (Reflexed)     Status: Abnormal   Collection Time: 10/25/18  8:38 PM  Result Value Ref Range Status   Enterococcus species NOT DETECTED NOT DETECTED Final   Listeria monocytogenes NOT DETECTED NOT DETECTED Final   Staphylococcus species NOT DETECTED NOT DETECTED Final   Staphylococcus aureus (BCID) NOT DETECTED NOT DETECTED Final   Streptococcus species DETECTED (A) NOT DETECTED Final    Comment: Not Enterococcus species, Streptococcus agalactiae, Streptococcus pyogenes, or Streptococcus pneumoniae. CRITICAL RESULT CALLED TO, READ BACK BY AND VERIFIED WITH: CHARLES SHANLEVER @0835  ON 10/26/2018 BY FMW    Streptococcus agalactiae NOT DETECTED NOT DETECTED Final   Streptococcus pneumoniae NOT DETECTED NOT DETECTED Final   Streptococcus pyogenes NOT DETECTED NOT DETECTED Final   Acinetobacter baumannii NOT DETECTED NOT DETECTED Final   Enterobacteriaceae species NOT DETECTED NOT DETECTED Final   Enterobacter cloacae complex NOT DETECTED NOT DETECTED Final   Escherichia coli NOT DETECTED NOT DETECTED Final   Klebsiella oxytoca NOT  DETECTED NOT DETECTED Final   Klebsiella pneumoniae NOT DETECTED NOT DETECTED Final   Proteus species NOT DETECTED NOT DETECTED Final   Serratia marcescens NOT DETECTED NOT DETECTED Final   Haemophilus influenzae NOT DETECTED NOT DETECTED Final   Neisseria meningitidis NOT DETECTED NOT DETECTED Final   Pseudomonas aeruginosa NOT DETECTED NOT DETECTED Final   Candida albicans NOT DETECTED NOT DETECTED Final   Candida glabrata NOT DETECTED NOT DETECTED Final   Candida krusei NOT DETECTED NOT DETECTED Final   Candida parapsilosis NOT DETECTED NOT DETECTED Final   Candida tropicalis NOT DETECTED NOT DETECTED Final    Comment: Performed at [facility], [address] [place], [place] [address]  SARS Coronavirus 2 by RT PCR (hospital order, performed in [facility] hospital lab) Nasopharyngeal Nasopharyngeal  Swab     Status: None   Collection Time: 10/25/18  8:49 PM   Specimen: Nasopharyngeal Swab  Result Value Ref Range Status   SARS Coronavirus 2 NEGATIVE NEGATIVE Final    Comment: (NOTE) If result is NEGATIVE SARS-CoV-2 target nucleic acids are NOT DETECTED. The SARS-CoV-2 RNA is generally detectable in upper and lower  respiratory specimens during the acute phase of infection. The lowest  concentration of SARS-CoV-2 viral copies this assay can detect is 250  copies / mL. A negative result does not preclude SARS-CoV-2 infection  and should not be used as the sole basis for treatment or other  patient management decisions.  A negative result may occur with  improper specimen collection / handling, submission of specimen other  than nasopharyngeal swab, presence of viral mutation(s) within the  areas targeted by this assay, and inadequate number of viral copies  (<250 copies / mL). A negative result must be combined with clinical  observations, patient history, and epidemiological information. If result is POSITIVE SARS-CoV-2 target nucleic acids are DETECTED. The SARS-CoV-2  RNA is generally detectable in upper and lower  respiratory specimens dur ing the acute phase of infection.  Positive  results are indicative of active infection with SARS-CoV-2.  Clinical  correlation with patient history and other diagnostic information is  necessary to determine patient infection status.  Positive results do  not rule out bacterial infection or co-infection with other viruses. If result is PRESUMPTIVE POSTIVE SARS-CoV-2 nucleic acids MAY BE PRESENT.   A presumptive positive result was obtained on the submitted specimen  and confirmed on repeat testing.  While 2019 novel coronavirus  (SARS-CoV-2) nucleic acids may be present in the submitted sample  additional confirmatory testing may be necessary for epidemiological  and / or clinical management purposes  to differentiate between  SARS-CoV-2 and other Sarbecovirus currently known to infect humans.  If clinically indicated additional testing with an alternate test  methodology [phone]) is advised. The SARS-CoV-2 RNA is generally  detectable in upper and lower respiratory sp ecimens during the acute  phase of infection. The expected result is Negative. Fact Sheet for Patients:  StrictlyIdeas.no Fact Sheet for Healthcare Providers: BankingDealers.co.za This test is not yet approved or cleared by the [place] FDA and has been authorized for detection and/or diagnosis of SARS-CoV-2 by FDA under an Emergency Use Authorization (EUA).  This EUA will remain in effect (meaning this test can be used) for the duration of the COVID-19 declaration under Section 564(b)(1) of the Act, 21 U.S.C. section 360bbb-3(b)(1), unless the authorization is terminated or revoked sooner. Performed at [facility], [address] [place], [place] [address]   MRSA PCR Screening     Status: Abnormal   Collection Time: 10/26/18 11:09 AM   Specimen: Nasopharyngeal  Result Value Ref  Range Status   MRSA by PCR POSITIVE (A) NEGATIVE Final    Comment:        The GeneXpert MRSA Assay (FDA approved for NASAL specimens only), is one component of a comprehensive MRSA colonization surveillance program. It is not intended to diagnose MRSA infection nor to guide or monitor treatment for MRSA infections. RESULT CALLED TO, READ BACK BY AND VERIFIED WITH: EMILY GANNON AT 1630 10/26/2018.PMF Performed at [facility], [place], [place] [id number]   CULTURE, BLOOD (ROUTINE X 2) w Reflex to ID Panel     Status: None   Collection Time: 10/28/18 12:48 AM   Specimen: BLOOD  Result  Value Ref Range Status   Specimen Description BLOOD RIGHT ANTECUBITAL  Final   Special Requests   Final    BOTTLES DRAWN AEROBIC AND ANAEROBIC Blood Culture adequate volume   Culture   Final    NO GROWTH 5 DAYS Performed at [facility], [address] [place], [place] [address]    Report Status 11/02/2018 FINAL  Final  CULTURE, BLOOD (ROUTINE X 2) w Reflex to ID Panel     Status: None   Collection Time: 10/28/18  5:06 AM   Specimen: BLOOD  Result Value Ref Range Status   Specimen Description BLOOD LEFT WRIST  Final   Special Requests   Final    BOTTLES DRAWN AEROBIC AND ANAEROBIC Blood Culture adequate volume   Culture   Final    NO GROWTH 5 DAYS Performed at [facility], [address], [place],  [address]    Report Status 11/02/2018 FINAL  Final    Coagulation Studies: No results for input(s): LABPROT, INR in the last 72 hours.  Urinalysis: No results for input(s): COLORURINE, LABSPEC, PHURINE, GLUCOSEU, HGBUR, BILIRUBINUR, KETONESUR, PROTEINUR, UROBILINOGEN, NITRITE, LEUKOCYTESUR in the last 72 hours.  Invalid input(s): APPERANCEUR    Imaging: No results found.   Medications:   . sodium chloride 10 mL/hr at 11/04/18 1745  . ampicillin (OMNIPEN) IV 2 g (11/05/18 1420)   . sodium chloride   Intravenous Once  . atorvastatin   40 mg Oral Daily  . Chlorhexidine Gluconate Cloth  6 each Topical Daily  . fluticasone  2 spray Each Nare Daily  . folic acid  1 mg Oral Daily  . furosemide  40 mg Intravenous BID  . insulin aspart  0-5 Units Subcutaneous QHS  . insulin aspart  0-9 Units Subcutaneous TID WC  . insulin glargine  10 Units Subcutaneous QHS  . magnesium citrate  1 Bottle Oral Once  . mometasone-formoterol  2 puff Inhalation BID  . pantoprazole  40 mg Oral BID  . pregabalin  75 mg Oral BID   sodium chloride, acetaminophen, albuterol  Assessment/ Plan:  [name] is a 63 y.o. black female with diabetes, hepatic cirrhosis, peptic ulcer disease, diastolic congestive heart failure, obstructive sleep apnea, coronary artery disease, hepatitis C who is admitted to [facility] on 10/25/2018 for Anemia, unspecified type [D64.9] Sepsis, due to unspecified organism, unspecified whether acute organ dysfunction present ([place]) [A41.9]   1. Acute renal failure on chronic kidney disease stage III with proteinuria:  Acute renal failure secondary to acute cardiorenal syndrome Chronic kidney disease secondary to diabetic nephropathy Baseline creatinine of 1.2, GFR of 56 on 08/29/18.  No indication for dialysis  2. Acute exacerbation of chronic diastolic congestive heart failure  3. Sepsis: group C strep on ampicillin  4. Anemia with chronic kidney disease  Plan - IV furosemide   LOS: [place] 10/19/20202:40 PM

## 2018-11-05 NOTE — Telephone Encounter (Signed)
-----   Message from Shelby Mattocks, [place] sent at 11/05/2018  1:40 PM EDT ----- Patient was seen in the hospital and had procedure done. Patient needs follow up appointment

## 2018-11-05 NOTE — Progress Notes (Signed)
Occupational Therapy Treatment Patient Details Name: [name] MRN: [id number] DOB: 1955/05/03 Today's Date: 11/05/2018    History of present illness Pt is a 63 y.o. female presenting to hospital 10/25/18 s/p fall trying to get up from toilet; pt with generalized weakness and SOB.  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  Pt transferred to CCU 10/29/18 secondary hyperkalemia and now transferred to medical floor.  Pt s/p small bowel enteroscopy 11/01/18 (general anesthesia) and new OT consult recieved 11/02/18.  PMH includes median sternotomy, CABG, L great toe amp 6/7, cervical CA, CHF, chronic anemia, COPD, DM, NSTEMI, upper GI bleed, h/o carpal tunnel syndrome, HNP lumbar.   OT comments  [name] was seen for OT treatment on this date. Upon arrival to room pt asleep, seated upright in room recliner. Pt awakens easily with VCs, and agreeable to OT tx this date. Pt denies pain, and engages well with UB ther-ex (see exercise section below for details). OT provides VC's for technique and encourages pt to count each repetiton to promote endurance/activity tolerance and minimize SOB. Pt return demonstrated understanding of instruction provided. After UB there-ex, pt noted to have urine running down legs, with external catheter not providing adequate suction. External catheter adjusted for placement and pt assisted with LB clean-up this date. NT in room and notified. Pt hospital socks changed and pt requests to remain up in chair until after lunch. Pt declines to have feet elevated this date. Pt making good progress toward goals and continues to benefit from skilled OT services to maximize return to PLOF and minimize risk of future falls, injury, caregiver burden, and readmission. Will continue to follow POC. Discharge recommendation  remains appropriate.    Follow Up Recommendations  SNF    Equipment Recommendations  None recommended by OT    Recommendations for Other Services      Precautions / Restrictions Precautions Precautions: Fall Restrictions Weight Bearing Restrictions: No       Mobility Bed Mobility Overal bed mobility: Needs Assistance             General bed mobility comments: Deferred. Pt up in recliner at start/end of session.  Transfers Overall transfer level: Needs assistance Equipment used: Rolling walker (2 wheeled) Transfers: Sit to/from Stand Sit to Stand: Max assist;+2 physical assistance              Balance Overall balance assessment: Needs assistance Sitting-balance support: No upper extremity supported;Feet supported Sitting balance-Leahy Scale: Good Sitting balance - Comments: steady sitting reaching within BOS                                   ADL either performed or assessed with clinical judgement   ADL Overall ADL's : Needs assistance/impaired             Lower Body Bathing: Sitting/lateral leans;Maximal assistance Lower Body Bathing Details (indicate cue type and reason): OT assisted pt with LB clean-up this date, as her external catheter did not catch her urine effectively while seated in the chair. Pt required set-up + max assist to clean BLEs this date.     Lower Body Dressing: Maximal assistance;Sit to/from stand Lower Body Dressing Details (indicate cue type and reason): Pt req. max assist to doff/don hospital socks this date. Her socks became soiled  after urinary incontinence. Toilet Transfer: Total assistance;Maximal assistance;BSC;Stand-pivot Toilet Transfer Details (indicate cue type and reason): Pt continues to be generally incontinent for bowel and bladder. Req. max-total assist for toileting/peri-care.                 Vision Baseline Vision/History: Wears glasses Wears Glasses: At all times Patient Visual Report:  No change from baseline     Perception     Praxis      Cognition Arousal/Alertness: Awake/alert Behavior During Therapy: WFL for tasks assessed/performed Overall Cognitive Status: Within Functional Limits for tasks assessed                                          Exercises Low Level/ICU Exercises Shoulder Flexion: AROM;Both;10 reps;Seated Elbow Flexion: AROM;Both;Other reps (comment);Seated(Pt does >30 reps independently. Cues for technique.) Shoulder Press: AROM;10 reps;Seated;Both Other Exercises Other Exercises: Pt assisted with LB clean-up after urinary incontinence and extermal catheter failure this date. Other Exercises: Pt instructed in UB ther-ex listed above   Shoulder Instructions       General Comments      Pertinent Vitals/ Pain       Pain Assessment: No/denies pain  Home Living                                          Prior Functioning/Environment              Frequency  Min 1X/week        Progress Toward Goals  OT Goals(current goals can now be found in the care plan section)  Progress towards OT goals: Progressing toward goals  Acute Rehab OT Goals Patient Stated Goal: to get stronger and be able to walk OT Goal Formulation: With patient Time For Goal Achievement: 11/14/18 Potential to Achieve Goals: Good  Plan Discharge plan remains appropriate;Frequency remains appropriate    Co-evaluation                 AM-PAC OT "6 Clicks" Daily Activity     Outcome Measure   Help from another person eating meals?: None Help from another person taking care of personal grooming?: A Little Help from another person toileting, which includes using toliet, bedpan, or urinal?: A Lot Help from another person bathing (including washing, rinsing, drying)?: A Lot Help from another person to put on and taking off regular upper body clothing?: A Lot Help from another person to put on and taking off regular lower  body clothing?: A Lot 6 Click Score: 15    End of Session    OT Visit Diagnosis: Other abnormalities of gait and mobility (R26.89);Muscle weakness (generalized) (M62.81)   Activity Tolerance Patient tolerated treatment well   Patient Left in chair;with call bell/phone within reach;with chair alarm set   Nurse Communication          Time: 3016-0109 OT Time Calculation (min): 18 min  Charges: OT General Charges $OT Visit: 1 Visit OT Treatments $Therapeutic Exercise: 8-22 mins  Shara Blazing, M.S., OTR/L Ascom: [phone] 11/05/18, 12:04 PM

## 2018-11-05 NOTE — Telephone Encounter (Signed)
Left vm  For pt to call office and schedule f/u apt from ED

## 2018-11-06 LAB — RENAL FUNCTION PANEL
Albumin: 2.3 g/dL — ABNORMAL LOW (ref 3.5–5.0)
Anion gap: 9 (ref 5–15)
BUN: 40 mg/dL — ABNORMAL HIGH (ref 8–23)
CO2: 27 mmol/L (ref 22–32)
Calcium: 7.7 mg/dL — ABNORMAL LOW (ref 8.9–10.3)
Chloride: 103 mmol/L (ref 98–111)
Creatinine, Ser: 1.92 mg/dL — ABNORMAL HIGH (ref 0.44–1.00)
GFR calc Af Amer: 32 mL/min — ABNORMAL LOW (ref >60)
GFR calc non Af Amer: 27 mL/min — ABNORMAL LOW (ref >60)
Glucose, Bld: 146 mg/dL — ABNORMAL HIGH (ref 70–99)
Phosphorus: 3.1 mg/dL (ref 2.5–4.6)
Potassium: 3.8 mmol/L (ref 3.5–5.1)
Sodium: 139 mmol/L (ref 135–145)

## 2018-11-06 LAB — GLUCOSE, CAPILLARY
Glucose-Capillary: 105 mg/dL — ABNORMAL HIGH (ref 70–99)
Glucose-Capillary: 158 mg/dL — ABNORMAL HIGH (ref 70–99)
Glucose-Capillary: 168 mg/dL — ABNORMAL HIGH (ref 70–99)
Glucose-Capillary: 190 mg/dL — ABNORMAL HIGH (ref 70–99)

## 2018-11-06 LAB — SARS CORONAVIRUS 2 (TAT 6-24 HRS): SARS Coronavirus 2: NEGATIVE

## 2018-11-06 MED ORDER — FUROSEMIDE 40 MG PO TABS
40.0000 mg | ORAL_TABLET | Freq: Two times a day (BID) | ORAL | Status: DC
  Administered 2018-11-06 – 2018-11-07 (×3): 40 mg via ORAL
  Filled 2018-11-06 (×3): qty 1

## 2018-11-06 MED ORDER — FUROSEMIDE 40 MG PO TABS: 40.0000 mg | ORAL_TABLET | Freq: Two times a day (BID) | ORAL | Status: DC

## 2018-11-06 NOTE — Progress Notes (Signed)
Physical Therapy Treatment Patient Details Name: [name] MRN: [id number] DOB: [birth year] Today's Date: 11/06/2018    History of Present Illness Pt is a 63 y.o. female presenting to hospital 10/25/18 s/p fall trying to get up from toilet; pt with generalized weakness and SOB.  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  Pt transferred to CCU 10/29/18 secondary hyperkalemia and now transferred to medical floor.  Pt s/p small bowel enteroscopy 11/01/18 (general anesthesia) and new OT consult recieved 11/02/18.  PMH includes median sternotomy, CABG, L great toe amp 6/7, cervical CA, CHF, chronic anemia, COPD, DM, NSTEMI, upper GI bleed, h/o carpal tunnel syndrome, HNP lumbar.    PT Comments    Initially declines due to fatigue today but agrees to supine ex with encouragement.  Participated in exercises as described below.  Pt with frequent self initiated rest breaks and encouragement to continue.  SNF remains appropriate for discharge.   Follow Up Recommendations  SNF     Equipment Recommendations  Rolling walker with 5" wheels;3in1 (PT);Wheelchair (measurements PT);Wheelchair cushion (measurements PT)    Recommendations for Other Services       Precautions / Restrictions Precautions Precautions: Fall Restrictions Weight Bearing Restrictions: No    Mobility  Bed Mobility               General bed mobility comments: Refuses due to fatigue  Transfers                 General transfer comment: refuses due to fatigue  Ambulation/Gait                 Stairs             Wheelchair Mobility    Modified Rankin (Stroke Patients Only)       Balance                                            Cognition Arousal/Alertness: Awake/alert Behavior During Therapy: WFL for  tasks assessed/performed Overall Cognitive Status: Within Functional Limits for tasks assessed                                        Exercises Other Exercises Other Exercises: Supine AROM x 10 BLE ankle pumps, heel slides, ab/add and SLR    General Comments        Pertinent Vitals/Pain Pain Assessment: Faces Faces Pain Scale: Hurts little more Pain Location: Flinches with attempt to assist RLE in repositioning. Pain Descriptors / Indicators: Sore Pain Intervention(s): Limited activity within patient's tolerance;Monitored during session;Repositioned    Home Living                      Prior Function            PT Goals (current goals can now be found in the care plan section) Progress towards PT goals: Progressing toward goals    Frequency    Min 2X/week      PT Plan Current plan remains appropriate    Co-evaluation              AM-PAC PT "6 Clicks" Mobility  Outcome Measure  Help needed turning from your back to your side while in a flat bed without using bedrails?: A Lot Help needed moving from lying on your back to sitting on the side of a flat bed without using bedrails?: Total Help needed moving to and from a bed to a chair (including a wheelchair)?: Total Help needed standing up from a chair using your arms (e.g., wheelchair or bedside chair)?: Total Help needed to walk in hospital room?: Total Help needed climbing 3-5 steps with a railing? : Total 6 Click Score: 7    End of Session   Activity Tolerance: Patient tolerated treatment well;Patient limited by fatigue Patient left: in bed;with call bell/phone within reach;with bed alarm set         Time: 1134-1145 PT Time Calculation (min) (ACUTE ONLY): 11 min  Charges:  $Therapeutic Exercise: 8-22 mins                    Chesley Noon, PTA 11/06/18, 11:50 AM

## 2018-11-06 NOTE — Progress Notes (Addendum)
[facility] [place] NAME: [name]    MR#:  [id number]  DATE OF BIRTH:  1955/06/30  SUBJECTIVE:   States that she she is feeling fine denies any complaints Patient awaiting bed facility.  REVIEW OF SYSTEMS:  CONSTITUTIONAL: No fever, reports fatigue or weakness.  EYES: No blurred or double vision.  EARS, NOSE, AND THROAT: No tinnitus or ear pain.  RESPIRATORY: No cough, shortness of breath, wheezing or hemoptysis.  CARDIOVASCULAR: No chest pain, orthopnea, edema.  GASTROINTESTINAL: No nausea, vomiting, diarrhea or abdominal pain.  GENITOURINARY: No dysuria, hematuria.  ENDOCRINE: No polyuria, nocturia,  HEMATOLOGY: No anemia, easy bruising or bleeding SKIN: No rash or lesion. MUSCULOSKELETAL: No joint pain or arthritis.   NEUROLOGIC: No tingling, numbness, weakness.  PSYCHIATRY: No anxiety or depression.  DRUG ALLERGIES:   Allergies  Allergen Reactions  . Latex Anaphylaxis and Rash  . Gabapentin Nausea And Vomiting and Other (See Comments)    VITALS:  Blood pressure (!) 146/64, pulse 81, temperature 97.9 F (36.6 C), temperature source Oral, resp. rate 18, height 5' 8"  (1.727 m), weight 112.3 kg, SpO2 99 %. PHYSICAL EXAMINATION:  GENERAL:  63 y.o.-year-old patient lying in the bed with no acute distress.  EYES: Pupils equal, round, reactive to light and accommodation. No scleral icterus. Extraocular muscles intact.  HEENT: Head atraumatic, normocephalic. Oropharynx and nasopharynx clear.  NECK:  Supple, no jugular venous distention. No thyroid enlargement, no tenderness.  LUNGS: Normal breath sounds bilaterally, no wheezing, rales,rhonchi or crepitation. No use of accessory muscles of respiration.  CARDIOVASCULAR: S1, S2 normal. No murmurs, rubs, or gallops.  ABDOMEN: Soft, nontender, nondistended. Bowel sounds present.  EXTREMITIES: No pedal edema, cyanosis, or clubbing.  NEUROLOGIC: Patient is arousable but  sleepy sensation intact. Gait not checked.  PSYCHIATRIC: The patient is sleepy SKIN: No obvious rash, lesion, or ulcer.  LABORATORY PANEL:   CBC Recent Labs  Lab 11/03/18 0643  WBC 8.1  HGB 8.5*  HCT 29.1*  PLT 161   ------------------------------------------------------------------------------------------------------------------  Chemistries  Recent Labs  Lab 11/06/18 1001  NA 139  K 3.8  CL 103  CO2 27  GLUCOSE 146*  BUN 40*  CREATININE 1.92*  CALCIUM 7.7*   ------------------------------------------------------------------------------------------------------------------  Cardiac Enzymes No results for input(s): TROPONINI in the last 168 hours. ------------------------------------------------------------------------------------------------------------------  RADIOLOGY:  No results found.  EKG:   Orders placed or performed during the hospital encounter of 10/25/18  . EKG 12-Lead  . EKG 12-Lead  . EKG 12-Lead  . EKG 12-Lead  . EKG 12-Lead  . EKG 12-Lead  . EKG 12-Lead  . EKG 12-Lead    ASSESSMENT AND PLAN:  63 y.o. 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 Delana Meyer, and CKD to the ED with worsening shortness of breath and fall.  1. Sepsis with multiorgan failure -sepsis present on admission - Covid negative - Likely urinary source - Blood cultures strep group C in 4 out of 4 bottles -Per ID can stop ampicillin today - TEE neg for any valve vegetations  2.  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  Status post push enteroscopy showing no active bleeding, gave 1 dose of IV iron on 1015  3. Elevated troponin -due to demand ischemia  4. DM: sugars have been well-controlled on current regimen  - hemoglobin A1c 1 6/5 was 5.9 - sliding scale insulin coverage in addition to Lantus - ADA 2100 calorie diet    5. Chronic congestive heart failure with reduced EF - stable Echocardiogram with moderate LVH and EF at 40 to45% and diffuse hypokinesis noted. Patient has had bioprosthesis for aortic valve - Change to oral lasix - Low salt diet   6. COPD -noevidence of exacerbation.  - Supplemental O2, goal sat 88-92% - Duonebs Q 6 hours - Continue home inhalers  7. Acute on chronic Kidney Disease 3 -renal function improved - Hold nephrotoxins, Nephrology following - Continue to monitor renal function - reduced Lyrica dose  8.  Anasarca - on Lasix 40 mg IV bid now, neg 9 L  9.weakness-PT recommends rehab,  10. Hyperkalemia: Resolved  Only bed offer she has is at [facility] in [place] has offered bed but they cannot take her today.  This manager following up on discharge  All the records are reviewed and case discussed with Care Management/Social Worker. Management plans discussed with the patient, nursing and they are in agreement.  CODE STATUS: Full code  TOTAL TIME TAKING CARE OF THIS PATIENT: 35 minutes.    Note: This dictation was prepared with Dragon dictation along with smaller phrase technology. Any transcriptional errors that result from this process are unintentional.   Dustin Flock M.D on 11/06/2018 at 10:35 AM  Between 7am to 6pm - Pager - [phone] After 6pm go to [email or web address] - password [email or web address] [facility]  Office   [phone]  CC: Primary care physician; Rafael Capo

## 2018-11-06 NOTE — TOC Progression Note (Addendum)
Transition of Care [facility]) - Progression Note    Patient Details  Name: [name] MRN: [id number] Date of Birth: 1955/12/22  Transition of Care [facility]) CM/SW [place], LCSW Phone Number: 11/06/2018, 11:56 AM  Clinical Narrative: [facility]. MD entered order for COVID test. Patient can admit once results are in. CSW called to update patient's husband. He is concerned about patient losing her Medicaid/disability check. He will call and discuss with patient and call CSW back. CSW made him aware of the amount of assistance she is needing at this time.    1:45 pm: Patient and her husband discussed concerns with losing Medicaid/disability check. Patient understands and still wants to go to rehab. Husband is agreeable. Left [place] admissions coordinator a message asking her to call husband to provide details regarding how they get paid.  Expected Discharge Plan: Skilled Nursing Facility Barriers to Discharge: SNF Pending bed offer, Continued Medical Work up  Expected Discharge Plan and Services Expected Discharge Plan: [place] arrangements for the past 2 months: Single Family Home Expected Discharge Date: 11/06/18                                     Social Determinants of Health (SDOH) Interventions    Readmission Risk Interventions Readmission Risk Prevention Plan 10/26/2018 08/21/2018 09/30/2017  Transportation Screening Complete Complete Complete  PCP or Specialist Appt within 5-7 Days - - Complete  Home Care Screening - - Complete  Medication Review (RN CM) - - Complete  HRI or Home Care Consult Complete Complete -  Social Work Consult for [place] Planning/Counseling Not Complete - -  SW consult not completed comments not recovery care - -  Palliative Care Screening Not Applicable Not Applicable -  Medication Review (RN Care Manager) Referral to Pharmacy Complete -  [place] or Home Care Consult  Complete - -  Palliative Care Screening Not Applicable - -  Some recent data might be hidden

## 2018-11-06 NOTE — Progress Notes (Signed)
[facility]  ROUNDING NOTE   Subjective:   UOP 2400  Creatinine 1.92 (2.21)  Objective:  Vital signs in last 24 hours:  Temp:  [97.9 F (36.6 C)-98.6 F (37 C)] 97.9 F (36.6 C) (10/20 0837) Pulse Rate:  [81-91] 81 (10/20 0837) Resp:  [18] 18 (10/20 0837) BP: (117-149)/(58-81) 146/64 (10/20 0837) SpO2:  [98 %-100 %] 99 % (10/20 0837) Weight:  [112.3 kg] 112.3 kg (10/20 0407)  Weight change:  Filed Weights   10/29/18 1532 10/30/18 2303 11/06/18 0407  Weight: 126.8 kg 123.1 kg 112.3 kg    Intake/Output: I/O last 3 completed shifts: In: 7 [P.O.:720; IV Piggyback:200] Out: 2458 [Urine:3750]   Intake/Output this shift:  Total I/O In: 360 [P.O.:360] Out: -   Physical Exam: General: NAD, laying in bed  Head: Normocephalic, atraumatic. Moist oral mucosal membranes  Eyes: Anicteric, PERRL  Neck: Supple, trachea midline  Lungs:  Clear to auscultation  Heart: Regular rate and rhythm  Abdomen:  Soft, nontender,   Extremities:  trace peripheral edema.  Neurologic: Nonfocal, moving all four extremities           Basic Metabolic Panel: Recent Labs  Lab 11/01/18 0546 11/02/18 0622 11/03/18 0643 11/04/18 0505 11/06/18 1001  NA 140 141 141 139 139  K 4.8 4.6 4.2 4.0 3.8  CL 108 108 106 106 103  CO2 21* 23 23 25 27   GLUCOSE 138* 122* 111* 131* 146*  BUN 42* 42* 42* 41* 40*  CREATININE 2.78* 2.71* 2.34* 2.21* 1.92*  CALCIUM 8.2* 8.2* 8.3* 8.2* 7.7*  PHOS  --   --   --   --  3.1    Liver Function Tests: Recent Labs  Lab 11/06/18 1001  ALBUMIN 2.3*   No results for input(s): LIPASE, AMYLASE in the last 168 hours. No results for input(s): AMMONIA in the last 168 hours.  CBC: Recent Labs  Lab 10/31/18 0645 11/01/18 0546 11/01/18 1840 11/02/18 0622 11/03/18 0643  WBC 7.3 6.8  --  6.9 8.1  HGB 7.7* 7.1* 9.0* 8.2* 8.5*  HCT 26.6* 24.3* 31.1* 28.4* 29.1*  MCV 81.8 81.8  --  83.8 82.4  PLT 179 167  --  162 161    Cardiac Enzymes: No  results for input(s): CKTOTAL, CKMB, CKMBINDEX, TROPONINI in the last 168 hours.  BNP: Invalid input(s): POCBNP  CBG: Recent Labs  Lab 11/05/18 1136 11/05/18 1648 11/05/18 2043 11/06/18 0747 11/06/18 1134  GLUCAP 178* 173* 233* 105* 158*    Microbiology: Results for orders placed or performed during the hospital encounter of 10/25/18  Blood culture (routine x 2)     Status: Abnormal   Collection Time: 10/25/18  8:38 PM   Specimen: BLOOD  Result Value Ref Range Status   Specimen Description   Final    BLOOD LEFT ANTECUBITAL Performed at [facility], [address] [place], [place] [address]    Special Requests   Final    BOTTLES DRAWN AEROBIC AND ANAEROBIC Blood Culture results may not be optimal due to an excessive volume of blood received in culture bottles Performed at [facility], [address] [place], [place] [address]    Culture  Setup Time   Final    GRAM POSITIVE COCCI IN BOTH AEROBIC AND ANAEROBIC BOTTLES CRITICAL RESULT CALLED TO, READ BACK BY AND VERIFIED WITH: CHARLES SHANLEVER @0835  ON 10/26/2018 BY FMW Performed at [facility], [place] [address], [place], [place] [address]    Culture STREPTOCOCCUS  GROUP C (A)  Final   Report Status 10/28/2018 FINAL  Final   Organism ID, Bacteria STREPTOCOCCUS GROUP C  Final      Susceptibility   Streptococcus group c - MIC*    CLINDAMYCIN <=0.25 SENSITIVE Sensitive     AMPICILLIN <=0.25 SENSITIVE Sensitive     ERYTHROMYCIN <=0.12 SENSITIVE Sensitive     VANCOMYCIN 0.5 SENSITIVE Sensitive     CEFTRIAXONE <=0.12 SENSITIVE Sensitive     LEVOFLOXACIN 0.5 SENSITIVE Sensitive     PENICILLIN Value in next row Sensitive      SENSITIVE<=0.06    * STREPTOCOCCUS GROUP C  Blood culture (routine x 2)     Status: Abnormal   Collection Time: 10/25/18  8:38 PM   Specimen: BLOOD  Result Value Ref Range Status   Specimen Description   Final    BLOOD RIGHT ANTECUBITAL Performed at [facility],  [address] [place], [place] [address]    Special Requests   Final    BOTTLES DRAWN AEROBIC AND ANAEROBIC Blood Culture results may not be optimal due to an excessive volume of blood received in culture bottles Performed at [facility], [address] [place], [place] [address]    Culture  Setup Time   Final    GRAM POSITIVE COCCI IN BOTH AEROBIC AND ANAEROBIC BOTTLES CRITICAL VALUE NOTED.  VALUE IS CONSISTENT WITH PREVIOUSLY REPORTED AND CALLED VALUE. Performed at [facility], [address] [place], [place] [address]    Culture (A)  Final    STREPTOCOCCUS GROUP C SUSCEPTIBILITIES PERFORMED ON PREVIOUS CULTURE WITHIN THE LAST 5 DAYS. Performed at [facility], [place] [address] [place], [place] [address]    Report Status 10/28/2018 FINAL  Final  Blood Culture ID Panel (Reflexed)     Status: Abnormal   Collection Time: 10/25/18  8:38 PM  Result Value Ref Range Status   Enterococcus species NOT DETECTED NOT DETECTED Final   Listeria monocytogenes NOT DETECTED NOT DETECTED Final   Staphylococcus species NOT DETECTED NOT DETECTED Final   Staphylococcus aureus (BCID) NOT DETECTED NOT DETECTED Final   Streptococcus species DETECTED (A) NOT DETECTED Final    Comment: Not Enterococcus species, Streptococcus agalactiae, Streptococcus pyogenes, or Streptococcus pneumoniae. CRITICAL RESULT CALLED TO, READ BACK BY AND VERIFIED WITH: CHARLES SHANLEVER @0835  ON 10/26/2018 BY FMW    Streptococcus agalactiae NOT DETECTED NOT DETECTED Final   Streptococcus pneumoniae NOT DETECTED NOT DETECTED Final   Streptococcus pyogenes NOT DETECTED NOT DETECTED Final   Acinetobacter baumannii NOT DETECTED NOT DETECTED Final   Enterobacteriaceae species NOT DETECTED NOT DETECTED Final   Enterobacter cloacae complex NOT DETECTED NOT DETECTED Final   Escherichia coli NOT DETECTED NOT DETECTED Final   Klebsiella oxytoca NOT DETECTED NOT DETECTED Final   Klebsiella pneumoniae NOT  DETECTED NOT DETECTED Final   Proteus species NOT DETECTED NOT DETECTED Final   Serratia marcescens NOT DETECTED NOT DETECTED Final   Haemophilus influenzae NOT DETECTED NOT DETECTED Final   Neisseria meningitidis NOT DETECTED NOT DETECTED Final   Pseudomonas aeruginosa NOT DETECTED NOT DETECTED Final   Candida albicans NOT DETECTED NOT DETECTED Final   Candida glabrata NOT DETECTED NOT DETECTED Final   Candida krusei NOT DETECTED NOT DETECTED Final   Candida parapsilosis NOT DETECTED NOT DETECTED Final   Candida tropicalis NOT DETECTED NOT DETECTED Final    Comment: Performed at [facility], [address] [place], [place] [address]  SARS Coronavirus 2 by RT PCR (  hospital order, performed in [facility] hospital lab) Nasopharyngeal Nasopharyngeal Swab     Status: None   Collection Time: 10/25/18  8:49 PM   Specimen: Nasopharyngeal Swab  Result Value Ref Range Status   SARS Coronavirus 2 NEGATIVE NEGATIVE Final    Comment: (NOTE) If result is NEGATIVE SARS-CoV-2 target nucleic acids are NOT DETECTED. The SARS-CoV-2 RNA is generally detectable in upper and lower  respiratory specimens during the acute phase of infection. The lowest  concentration of SARS-CoV-2 viral copies this assay can detect is 250  copies / mL. A negative result does not preclude SARS-CoV-2 infection  and should not be used as the sole basis for treatment or other  patient management decisions.  A negative result may occur with  improper specimen collection / handling, submission of specimen other  than nasopharyngeal swab, presence of viral mutation(s) within the  areas targeted by this assay, and inadequate number of viral copies  (<250 copies / mL). A negative result must be combined with clinical  observations, patient history, and epidemiological information. If result is POSITIVE SARS-CoV-2 target nucleic acids are DETECTED. The SARS-CoV-2 RNA is generally detectable in upper and lower   respiratory specimens dur ing the acute phase of infection.  Positive  results are indicative of active infection with SARS-CoV-2.  Clinical  correlation with patient history and other diagnostic information is  necessary to determine patient infection status.  Positive results do  not rule out bacterial infection or co-infection with other viruses. If result is PRESUMPTIVE POSTIVE SARS-CoV-2 nucleic acids MAY BE PRESENT.   A presumptive positive result was obtained on the submitted specimen  and confirmed on repeat testing.  While 2019 novel coronavirus  (SARS-CoV-2) nucleic acids may be present in the submitted sample  additional confirmatory testing may be necessary for epidemiological  and / or clinical management purposes  to differentiate between  SARS-CoV-2 and other Sarbecovirus currently known to infect humans.  If clinically indicated additional testing with an alternate test  methodology [phone]) is advised. The SARS-CoV-2 RNA is generally  detectable in upper and lower respiratory sp ecimens during the acute  phase of infection. The expected result is Negative. Fact Sheet for Patients:  StrictlyIdeas.no Fact Sheet for Healthcare Providers: BankingDealers.co.za This test is not yet approved or cleared by the [place] FDA and has been authorized for detection and/or diagnosis of SARS-CoV-2 by FDA under an Emergency Use Authorization (EUA).  This EUA will remain in effect (meaning this test can be used) for the duration of the COVID-19 declaration under Section 564(b)(1) of the Act, 21 U.S.C. section 360bbb-3(b)(1), unless the authorization is terminated or revoked sooner. Performed at [facility], [address] [place], [place] [address]   MRSA PCR Screening     Status: Abnormal   Collection Time: 10/26/18 11:09 AM   Specimen: Nasopharyngeal  Result Value Ref Range Status   MRSA by PCR POSITIVE (A)  NEGATIVE Final    Comment:        The GeneXpert MRSA Assay (FDA approved for NASAL specimens only), is one component of a comprehensive MRSA colonization surveillance program. It is not intended to diagnose MRSA infection nor to guide or monitor treatment for MRSA infections. RESULT CALLED TO, READ BACK BY AND VERIFIED WITH: EMILY GANNON AT 1630 10/26/2018.PMF Performed at [facility], [place], [place] [address]   CULTURE, BLOOD (ROUTINE X 2) w Reflex to ID Panel     Status: None   Collection  Time: 10/28/18 12:48 AM   Specimen: BLOOD  Result Value Ref Range Status   Specimen Description BLOOD RIGHT ANTECUBITAL  Final   Special Requests   Final    BOTTLES DRAWN AEROBIC AND ANAEROBIC Blood Culture adequate volume   Culture   Final    NO GROWTH 5 DAYS Performed at [facility], [address] [place], [place] [address]    Report Status 11/02/2018 FINAL  Final  CULTURE, BLOOD (ROUTINE X 2) w Reflex to ID Panel     Status: None   Collection Time: 10/28/18  5:06 AM   Specimen: BLOOD  Result Value Ref Range Status   Specimen Description BLOOD LEFT WRIST  Final   Special Requests   Final    BOTTLES DRAWN AEROBIC AND ANAEROBIC Blood Culture adequate volume   Culture   Final    NO GROWTH 5 DAYS Performed at [facility], [address], [place], [place] [address]    Report Status 11/02/2018 FINAL  Final    Coagulation Studies: No results for input(s): LABPROT, INR in the last 72 hours.  Urinalysis: No results for input(s): COLORURINE, LABSPEC, PHURINE, GLUCOSEU, HGBUR, BILIRUBINUR, KETONESUR, PROTEINUR, UROBILINOGEN, NITRITE, LEUKOCYTESUR in the last 72 hours.  Invalid input(s): APPERANCEUR    Imaging: No results found.   Medications:   . sodium chloride 10 mL/hr at 11/04/18 1745   . sodium chloride   Intravenous Once  . atorvastatin  40 mg Oral Daily  . Chlorhexidine Gluconate Cloth  6 each Topical Daily  . fluticasone   2 spray Each Nare Daily  . folic acid  1 mg Oral Daily  . furosemide  40 mg Oral BID  . insulin aspart  0-5 Units Subcutaneous QHS  . insulin aspart  0-9 Units Subcutaneous TID WC  . insulin glargine  10 Units Subcutaneous QHS  . magnesium citrate  1 Bottle Oral Once  . mometasone-formoterol  2 puff Inhalation BID  . pantoprazole  40 mg Oral BID  . pregabalin  75 mg Oral BID   sodium chloride, acetaminophen, albuterol  Assessment/ Plan:  [name] is a 63 y.o. black female with diabetes, hepatic cirrhosis, peptic ulcer disease, diastolic congestive heart failure, obstructive sleep apnea, coronary artery disease, hepatitis C who is admitted to [facility] on 10/25/2018 for Anemia, unspecified type [D64.9] Sepsis, due to unspecified organism, unspecified whether acute organ dysfunction present ([place]) [A41.9]   1. Acute renal failure on chronic kidney disease stage III with proteinuria:  Acute renal failure secondary to acute cardiorenal syndrome Chronic kidney disease secondary to diabetic nephropathy Baseline creatinine of 1.2, GFR of 56 on 08/29/18.  No indication for dialysis Holding lisinopril  2. Acute exacerbation of chronic diastolic congestive heart failure - PO furosemide - consider restarting spironolactone before discharge  3. Sepsis: group C strep on ampicillin  4. Anemia with chronic kidney disease: hemoglobin 8.5    LOS: 11 [name] 10/20/202012:21 PM

## 2018-11-07 LAB — GLUCOSE, CAPILLARY
Glucose-Capillary: 206 mg/dL — ABNORMAL HIGH (ref 70–99)
Glucose-Capillary: 216 mg/dL — ABNORMAL HIGH (ref 70–99)

## 2018-11-07 NOTE — Progress Notes (Signed)
[facility]  ROUNDING NOTE   Subjective:   Patient states she feels tired. No new labs today.   Objective:  Vital signs in last 24 hours:  Temp:  [98.2 F (36.8 C)-98.4 F (36.9 C)] 98.4 F (36.9 C) (10/20 1938) Pulse Rate:  [83-86] 86 (10/20 1938) Resp:  [18] 18 (10/20 1459) BP: (124-137)/(56-64) 137/64 (10/20 1938) SpO2:  [99 %-100 %] 100 % (10/20 1938)  Weight change:  Filed Weights   10/29/18 1532 10/30/18 2303 11/06/18 0407  Weight: 126.8 kg 123.1 kg 112.3 kg    Intake/Output: I/O last 3 completed shifts: In: 23 [P.O.:840; IV Piggyback:200] Out: 1400 [Urine:1400]   Intake/Output this shift:  Total I/O In: 240 [P.O.:240] Out: -   Physical Exam: General: NAD, laying in bed  Head: Normocephalic, atraumatic. Moist oral mucosal membranes  Eyes: Anicteric, PERRL  Neck: Supple, trachea midline  Lungs:  Clear to auscultation  Heart: Regular rate and rhythm  Abdomen:  Soft, nontender,   Extremities:  trace peripheral edema.  Neurologic: Nonfocal, moving all four extremities           Basic Metabolic Panel: Recent Labs  Lab 11/01/18 0546 11/02/18 0622 11/03/18 0643 11/04/18 0505 11/06/18 1001  NA 140 141 141 139 139  K 4.8 4.6 4.2 4.0 3.8  CL 108 108 106 106 103  CO2 21* 23 23 25 27   GLUCOSE 138* 122* 111* 131* 146*  BUN 42* 42* 42* 41* 40*  CREATININE 2.78* 2.71* 2.34* 2.21* 1.92*  CALCIUM 8.2* 8.2* 8.3* 8.2* 7.7*  PHOS  --   --   --   --  3.1    Liver Function Tests: Recent Labs  Lab 11/06/18 1001  ALBUMIN 2.3*   No results for input(s): LIPASE, AMYLASE in the last 168 hours. No results for input(s): AMMONIA in the last 168 hours.  CBC: Recent Labs  Lab 11/01/18 0546 11/01/18 1840 11/02/18 0622 11/03/18 0643  WBC 6.8  --  6.9 8.1  HGB 7.1* 9.0* 8.2* 8.5*  HCT 24.3* 31.1* 28.4* 29.1*  MCV 81.8  --  83.8 82.4  PLT 167  --  162 161    Cardiac Enzymes: No results for input(s): CKTOTAL, CKMB, CKMBINDEX, TROPONINI in  the last 168 hours.  BNP: Invalid input(s): POCBNP  CBG: Recent Labs  Lab 11/06/18 1134 11/06/18 1653 11/06/18 2046 11/07/18 0718 11/07/18 1202  GLUCAP 158* 168* 190* 206* 216*    Microbiology: Results for orders placed or performed during the hospital encounter of 10/25/18  Blood culture (routine x 2)     Status: Abnormal   Collection Time: 10/25/18  8:38 PM   Specimen: BLOOD  Result Value Ref Range Status   Specimen Description   Final    BLOOD LEFT ANTECUBITAL Performed at [facility], [address] [place], [place] [address]    Special Requests   Final    BOTTLES DRAWN AEROBIC AND ANAEROBIC Blood Culture results may not be optimal due to an excessive volume of blood received in culture bottles Performed at [facility], [address], [place], [place] [address]    Culture  Setup Time   Final    GRAM POSITIVE COCCI IN BOTH AEROBIC AND ANAEROBIC BOTTLES CRITICAL RESULT CALLED TO, READ BACK BY AND VERIFIED WITH: CHARLES SHANLEVER @0835  ON 10/26/2018 BY FMW Performed at [facility], [place] [address], [place], [place] [address]    Culture STREPTOCOCCUS GROUP C (A)  Final   Report Status 10/28/2018 FINAL  Final   Organism ID, Bacteria STREPTOCOCCUS GROUP C  Final      Susceptibility   Streptococcus group c - MIC*    CLINDAMYCIN <=0.25 SENSITIVE Sensitive     AMPICILLIN <=0.25 SENSITIVE Sensitive     ERYTHROMYCIN <=0.12 SENSITIVE Sensitive     VANCOMYCIN 0.5 SENSITIVE Sensitive     CEFTRIAXONE <=0.12 SENSITIVE Sensitive     LEVOFLOXACIN 0.5 SENSITIVE Sensitive     PENICILLIN Value in next row Sensitive      SENSITIVE<=0.06    * STREPTOCOCCUS GROUP C  Blood culture (routine x 2)     Status: Abnormal   Collection Time: 10/25/18  8:38 PM   Specimen: BLOOD  Result Value Ref Range Status   Specimen Description   Final    BLOOD RIGHT ANTECUBITAL Performed at [facility], [address] [place], [place] [address]    Special  Requests   Final    BOTTLES DRAWN AEROBIC AND ANAEROBIC Blood Culture results may not be optimal due to an excessive volume of blood received in culture bottles Performed at [facility], [address] [place], [place] [address]    Culture  Setup Time   Final    GRAM POSITIVE COCCI IN BOTH AEROBIC AND ANAEROBIC BOTTLES CRITICAL VALUE NOTED.  VALUE IS CONSISTENT WITH PREVIOUSLY REPORTED AND CALLED VALUE. Performed at [facility], [address] [place], [place] [address]    Culture (A)  Final    STREPTOCOCCUS GROUP C SUSCEPTIBILITIES PERFORMED ON PREVIOUS CULTURE WITHIN THE LAST 5 DAYS. Performed at [facility], [place] [address], [place], [place] [address]    Report Status 10/28/2018 FINAL  Final  Blood Culture ID Panel (Reflexed)     Status: Abnormal   Collection Time: 10/25/18  8:38 PM  Result Value Ref Range Status   Enterococcus species NOT DETECTED NOT DETECTED Final   Listeria monocytogenes NOT DETECTED NOT DETECTED Final   Staphylococcus species NOT DETECTED NOT DETECTED Final   Staphylococcus aureus (BCID) NOT DETECTED NOT DETECTED Final   Streptococcus species DETECTED (A) NOT DETECTED Final    Comment: Not Enterococcus species, Streptococcus agalactiae, Streptococcus pyogenes, or Streptococcus pneumoniae. CRITICAL RESULT CALLED TO, READ BACK BY AND VERIFIED WITH: CHARLES SHANLEVER @0835  ON 10/26/2018 BY FMW    Streptococcus agalactiae NOT DETECTED NOT DETECTED Final   Streptococcus pneumoniae NOT DETECTED NOT DETECTED Final   Streptococcus pyogenes NOT DETECTED NOT DETECTED Final   Acinetobacter baumannii NOT DETECTED NOT DETECTED Final   Enterobacteriaceae species NOT DETECTED NOT DETECTED Final   Enterobacter cloacae complex NOT DETECTED NOT DETECTED Final   Escherichia coli NOT DETECTED NOT DETECTED Final   Klebsiella oxytoca NOT DETECTED NOT DETECTED Final   Klebsiella pneumoniae NOT DETECTED NOT DETECTED Final   Proteus species NOT  DETECTED NOT DETECTED Final   Serratia marcescens NOT DETECTED NOT DETECTED Final   Haemophilus influenzae NOT DETECTED NOT DETECTED Final   Neisseria meningitidis NOT DETECTED NOT DETECTED Final   Pseudomonas aeruginosa NOT DETECTED NOT DETECTED Final   Candida albicans NOT DETECTED NOT DETECTED Final   Candida glabrata NOT DETECTED NOT DETECTED Final   Candida krusei NOT DETECTED NOT DETECTED Final   Candida parapsilosis NOT DETECTED NOT DETECTED Final   Candida tropicalis NOT DETECTED NOT DETECTED Final    Comment: Performed at [facility], [address] [place], [place] [address]  SARS Coronavirus 2 by RT PCR (hospital order, performed in [facility] hospital lab) Nasopharyngeal Nasopharyngeal Swab  Status: None   Collection Time: 10/25/18  8:49 PM   Specimen: Nasopharyngeal Swab  Result Value Ref Range Status   SARS Coronavirus 2 NEGATIVE NEGATIVE Final    Comment: (NOTE) If result is NEGATIVE SARS-CoV-2 target nucleic acids are NOT DETECTED. The SARS-CoV-2 RNA is generally detectable in upper and lower  respiratory specimens during the acute phase of infection. The lowest  concentration of SARS-CoV-2 viral copies this assay can detect is 250  copies / mL. A negative result does not preclude SARS-CoV-2 infection  and should not be used as the sole basis for treatment or other  patient management decisions.  A negative result may occur with  improper specimen collection / handling, submission of specimen other  than nasopharyngeal swab, presence of viral mutation(s) within the  areas targeted by this assay, and inadequate number of viral copies  (<250 copies / mL). A negative result must be combined with clinical  observations, patient history, and epidemiological information. If result is POSITIVE SARS-CoV-2 target nucleic acids are DETECTED. The SARS-CoV-2 RNA is generally detectable in upper and lower  respiratory specimens dur ing the acute phase of  infection.  Positive  results are indicative of active infection with SARS-CoV-2.  Clinical  correlation with patient history and other diagnostic information is  necessary to determine patient infection status.  Positive results do  not rule out bacterial infection or co-infection with other viruses. If result is PRESUMPTIVE POSTIVE SARS-CoV-2 nucleic acids MAY BE PRESENT.   A presumptive positive result was obtained on the submitted specimen  and confirmed on repeat testing.  While 2019 novel coronavirus  (SARS-CoV-2) nucleic acids may be present in the submitted sample  additional confirmatory testing may be necessary for epidemiological  and / or clinical management purposes  to differentiate between  SARS-CoV-2 and other Sarbecovirus currently known to infect humans.  If clinically indicated additional testing with an alternate test  methodology [phone]) is advised. The SARS-CoV-2 RNA is generally  detectable in upper and lower respiratory sp ecimens during the acute  phase of infection. The expected result is Negative. Fact Sheet for Patients:  StrictlyIdeas.no Fact Sheet for Healthcare Providers: BankingDealers.co.za This test is not yet approved or cleared by the [place] FDA and has been authorized for detection and/or diagnosis of SARS-CoV-2 by FDA under an Emergency Use Authorization (EUA).  This EUA will remain in effect (meaning this test can be used) for the duration of the COVID-19 declaration under Section 564(b)(1) of the Act, 21 U.S.C. section 360bbb-3(b)(1), unless the authorization is terminated or revoked sooner. Performed at [facility], [address] [place], [place] [address]   MRSA PCR Screening     Status: Abnormal   Collection Time: 10/26/18 11:09 AM   Specimen: Nasopharyngeal  Result Value Ref Range Status   MRSA by PCR POSITIVE (A) NEGATIVE Final    Comment:        The GeneXpert MRSA  Assay (FDA approved for NASAL specimens only), is one component of a comprehensive MRSA colonization surveillance program. It is not intended to diagnose MRSA infection nor to guide or monitor treatment for MRSA infections. RESULT CALLED TO, READ BACK BY AND VERIFIED WITH: EMILY GANNON AT 1630 10/26/2018.PMF Performed at [facility], [place], [place] [address]   CULTURE, BLOOD (ROUTINE X 2) w Reflex to ID Panel     Status: None   Collection Time: 10/28/18 12:48 AM   Specimen: BLOOD  Result Value Ref Range Status  Specimen Description BLOOD RIGHT ANTECUBITAL  Final   Special Requests   Final    BOTTLES DRAWN AEROBIC AND ANAEROBIC Blood Culture adequate volume   Culture   Final    NO GROWTH 5 DAYS Performed at [facility], [address] [place], [place] [address]    Report Status 11/02/2018 FINAL  Final  CULTURE, BLOOD (ROUTINE X 2) w Reflex to ID Panel     Status: None   Collection Time: 10/28/18  5:06 AM   Specimen: BLOOD  Result Value Ref Range Status   Specimen Description BLOOD LEFT WRIST  Final   Special Requests   Final    BOTTLES DRAWN AEROBIC AND ANAEROBIC Blood Culture adequate volume   Culture   Final    NO GROWTH 5 DAYS Performed at [facility], [address] [place], [place] [address]    Report Status 11/02/2018 FINAL  Final  SARS CORONAVIRUS 2 (TAT 6-24 HRS) Nasopharyngeal Nasopharyngeal Swab     Status: None   Collection Time: 11/06/18  1:53 PM   Specimen: Nasopharyngeal Swab  Result Value Ref Range Status   SARS Coronavirus 2 NEGATIVE NEGATIVE Final    Comment: (NOTE) SARS-CoV-2 target nucleic acids are NOT DETECTED. The SARS-CoV-2 RNA is generally detectable in upper and lower respiratory specimens during the acute phase of infection. Negative results do not preclude SARS-CoV-2 infection, do not rule out co-infections with other pathogens, and should not be used as the sole basis for treatment or other  patient management decisions. Negative results must be combined with clinical observations, patient history, and epidemiological information. The expected result is Negative. Fact Sheet for Patients: SugarRoll.be Fact Sheet for Healthcare Providers: https://www.woods-mathews.com/ This test is not yet approved or cleared by the [place] FDA and  has been authorized for detection and/or diagnosis of SARS-CoV-2 by FDA under an Emergency Use Authorization (EUA). This EUA will remain  in effect (meaning this test can be used) for the duration of the COVID-19 declaration under Section 56 4(b)(1) of the Act, 21 U.S.C. section 360bbb-3(b)(1), unless the authorization is terminated or revoked sooner. Performed at [facility], [place] [address] [place], [place] [address]     Coagulation Studies: No results for input(s): LABPROT, INR in the last 72 hours.  Urinalysis: No results for input(s): COLORURINE, LABSPEC, PHURINE, GLUCOSEU, HGBUR, BILIRUBINUR, KETONESUR, PROTEINUR, UROBILINOGEN, NITRITE, LEUKOCYTESUR in the last 72 hours.  Invalid input(s): APPERANCEUR    Imaging: No results found.   Medications:   . sodium chloride 10 mL/hr at 11/04/18 1745   . sodium chloride   Intravenous Once  . atorvastatin  40 mg Oral Daily  . Chlorhexidine Gluconate Cloth  6 each Topical Daily  . fluticasone  2 spray Each Nare Daily  . folic acid  1 mg Oral Daily  . furosemide  40 mg Oral BID  . insulin aspart  0-5 Units Subcutaneous QHS  . insulin aspart  0-9 Units Subcutaneous TID WC  . insulin glargine  10 Units Subcutaneous QHS  . magnesium citrate  1 Bottle Oral Once  . mometasone-formoterol  2 puff Inhalation BID  . pantoprazole  40 mg Oral BID  . pregabalin  75 mg Oral BID   sodium chloride, acetaminophen, albuterol  Assessment/ Plan:  [name] is a 63 y.o. black female with diabetes, hepatic cirrhosis, peptic ulcer  disease, diastolic congestive heart failure, obstructive sleep apnea, coronary artery disease, hepatitis C who is admitted to [facility] on 10/25/2018 for  Anemia, unspecified type [D64.9] Sepsis, due to unspecified organism, unspecified whether acute organ dysfunction present ([place]) [A41.9]   1. 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  No indication for dialysis Holding lisinopril  2. Acute exacerbation of chronic diastolic congestive heart failure - PO furosemide - consider restarting spironolactone    3. Sepsis: group C strep on ampicillin  4. Anemia with chronic kidney disease: hemoglobin 8.5  Will need hospital follow with Dr. Candiss Norse, nephrology.     LOS: 12 [name] 10/21/202012:14 PM

## 2018-11-07 NOTE — Progress Notes (Signed)
Report called to [facility]. Family made aware. Madlyn Frankel, RN

## 2018-11-07 NOTE — TOC Transition Note (Signed)
Transition of Care [facility]) - CM/SW Discharge Note   Patient Details  Name: [name] MRN: [id number] Date of Birth: March 29, 1955  [facility]) CM/SW Contact:  Candie Chroman, LCSW Phone Number: 11/07/2018, 1:18 PM   Clinical Narrative: Patient has orders to discharge to [facility] today. RN will call report to [phone] prior to setting up EMS transport. No further concerns. CSW signing off.    Final next level of care: [place] Barriers to Discharge: Barriers Resolved   Patient Goals and CMS Choice Patient states their goals for this hospitalization and ongoing recovery are:: go to rehab      Discharge Placement              Patient chooses bed at: [facility] Patient to be transferred to facility by: EMS Name of family member notified: Rexanne Mano Patient and family notified of of transfer: 11/07/18  Discharge Plan and Services                                     Social Determinants of Health (SDOH) Interventions     Readmission Risk Interventions Readmission Risk Prevention Plan 10/26/2018 08/21/2018 09/30/2017  Transportation Screening Complete Complete Complete  PCP or Specialist Appt within 5-7 Days - - Complete  Home Care Screening - - Complete  Medication Review (RN CM) - - Complete  HRI or Home Care Consult Complete Complete -  Social Work Consult for [place] Planning/Counseling Not Complete - -  SW consult not completed comments not recovery care - -  Palliative Care Screening Not Applicable Not Applicable -  Medication Review (RN Care Manager) Referral to Pharmacy Complete -  [place] or Home Care Consult Complete - -  Palliative Care Screening Not Applicable - -  Some recent data might be hidden

## 2018-11-07 NOTE — Progress Notes (Signed)
EMS called for transport. Madlyn Frankel, RN

## 2018-11-07 NOTE — TOC Progression Note (Signed)
Transition of Care [facility]) - Progression Note    Patient Details  Name: SHAWNTE WINTON MRN: [id number] Date of Birth: [birth year]  Transition of Care [facility]) CM/SW [place], LCSW Phone Number: 11/07/2018, 9:41 AM  Clinical Narrative: Left message for [facility] admissions coordinator to let her know COVID test is negative.   Expected Discharge Plan: Skilled Nursing Facility Barriers to Discharge: SNF Pending bed offer, Continued Medical Work up  Expected Discharge Plan and Services Expected Discharge Plan: [place] arrangements for the past 2 months: Single Family Home Expected Discharge Date: 11/06/18                                     Social Determinants of Health (SDOH) Interventions    Readmission Risk Interventions Readmission Risk Prevention Plan 10/26/2018 08/21/2018 09/30/2017  Transportation Screening Complete Complete Complete  PCP or Specialist Appt within 5-7 Days - - Complete  Home Care Screening - - Complete  Medication Review (RN CM) - - Complete  HRI or Home Care Consult Complete Complete -  Social Work Consult for [place] Planning/Counseling Not Complete - -  SW consult not completed comments not recovery care - -  Palliative Care Screening Not Applicable Not Applicable -  Medication Review (RN Care Manager) Referral to Pharmacy Complete -  [place] or Home Care Consult Complete - -  Palliative Care Screening Not Applicable - -  Some recent data might be hidden

## 2018-11-07 NOTE — Discharge Summary (Signed)
[facility] [place] [facility] [place], 63 y.o., DOB 06-Aug-1955, MRN [id number]. 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 Delana [name], and CKD to the ED with worsening shortness of breath and fall.  Patient very lethargic unable to provide history, history therefore obtained from patient's chart.  Per ED report patient arrived via EMS from home.  She states she was sitting on toilet and while attempting to get up fell on the floor.  Denies hitting head or loss of consciousness.  Patient states she started feeling very short of breath, per EMS oxygen saturation 88% on room air.  She was also complaining of abdominal pain without nausea or vomiting, hematuria, hemoptysis, hematochezia, melena or any other GI related symptoms.  Patient denies fevers or chills or recent sick contacts.  On arrival to the ED, she was febrile with temp 100.0 with blood pressure 84/55 mm Hg and pulse rate 102  beats/min. There were no focal neurological deficits; she was alert and oriented x4, and appeared lethargic.  Initial labs revealed WBC 14, hemoglobin 5.6, hematocrit 20.4, MCV 78.5, creatinine 1.85 slightly above baseline, glucose 191, troponin 89, lactic acid 3.9 COVID 19-.  Chest x-ray did not show any active cardiopulmonary process.  Hospitalist asked to admit for further management.  Patient was admitted and started on antibiotics there was some concern for sepsis.  Her blood cultures did come back positive.  She was seen in consultation by infectious disease who recommended IV ampicillin, per ID no further antibiotics are needed patient has not had any further fevers or chills.  She also had acute on chronic renal failure that is currently stable.  Patient is very weak and deconditioned need of rehab.           Consults  nephrology, id  Significant Tests:  See full reports for all details     Ct Abdomen Pelvis Wo Contrast  Result Date: 10/26/2018 CLINICAL DATA:  Abdominal pain, gastroenteritis or colitis EXAM: CT ABDOMEN AND PELVIS WITHOUT CONTRAST TECHNIQUE: Multidetector CT imaging of the abdomen and pelvis was performed following the standard protocol without IV contrast. COMPARISON:  June 21, 2018 FINDINGS: Lower chest: There is moderate cardiomegaly. Small bilateral pleural effusions are seen, right greater than left with peripheral atelectasis. Hepatobiliary: Although limited due to the lack of intravenous contrast, normal in appearance without gross focal abnormality. The patient is status post cholecystectomy. No biliary ductal dilation. Pancreas:  Unremarkable.  No surrounding inflammatory changes. Spleen: Normal in size. Although limited due to the lack of intravenous contrast, normal in appearance. Adrenals/Urinary Tract: Both adrenal glands appear normal. Again noted is a 6 cm low-density lesion lower pole of the left  kidney brought to pull left-sided low-density lesions are  seen. Bilateral perinephric stranding is seen. This appears to be more prominent than the prior exam, and is asymmetric, right greater than left. Stomach/Bowel: The stomach and small bowel are normal in size and contour. There is a moderate amount of colonic stool. Scattered colonic diverticula are noted without diverticulitis. Vascular/Lymphatic: There are no enlarged abdominal or pelvic lymph nodes. Scattered aortic atherosclerotic calcifications are seen without aneurysmal dilatation. Reproductive: The uterus and adnexa are unremarkable. Other: There is diffuse extensive anasarca seen. There is a small fat containing anterior umbilical hernia. A small amount of perihepatic ascites is seen. Musculoskeletal: No acute or significant osseous findings. IMPRESSION: 1. Diffuse extensive anasarca. 2. Small bilateral pleural effusions, slightly increased in size from the prior exam. 3. Small amount of perihepatic ascites. 4. Bilateral perinephric stranding, right greater than left, increased from the prior exam. This could be due to acute pyelonephritis. 5. Colonic diverticula without diverticulitis. Electronically Signed   By: Prudencio Pair M.D.   On: 10/26/2018 03:12   Dg Abd 1 View  Result Date: 10/31/2018 CLINICAL DATA:  Ileus EXAM: ABDOMEN - 1 VIEW COMPARISON:  Yesterday FINDINGS: No dilated bowel. Moderate stool intermittently seen along the colon. No concerning mass effect or calcification. High-density material over the pelvis was not seen on abdominal CT from 4 days ago and has progressed by radiography. IMPRESSION: 1. Nonobstructive bowel gas pattern. 2. Moderate stool volume. 3. A high-density structure is progressing through the colon, now at the rectum. Is there a missing dental cap? Electronically Signed   By: Monte Fantasia M.D.   On: 10/31/2018 05:36   Dg Abd 1 View  Result Date: 10/30/2018 CLINICAL DATA:  Pulmonary disease.  Possible ileus. EXAM: ABDOMEN - 1 VIEW COMPARISON:  CT 10/26/2018.  FINDINGS: Prior median sternotomy, CABG, and cardiac valve replacement. Surgical clips right upper quadrant. Air-filled loops of small and large bowel noted. These are nondilated. Follow-up exam suggested to exclude developing bowel distention. No free air. Metallic density of unknown etiology noted over the left abdomen. Degenerative changes thoracolumbar spine and both hips. IMPRESSION: Air-filled loops of small large bowel noted. These are nondilated. Follow-up exam suggest to exclude developing bowel distention. Electronically Signed   By: Marcello Moores  Register   On: 10/30/2018 09:57   Dg Chest Port 1 View  Result Date: 10/31/2018 CLINICAL DATA:  Acute respiratory failure EXAM: PORTABLE CHEST 1 VIEW COMPARISON:  Yesterday FINDINGS: Chronic cardiomegaly. 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 Vascular congestion. Mild reticulation in the left mid lung, likely scarring. There is no edema, consolidation, effusion, or pneumothorax. IMPRESSION: Cardiomegaly and vascular congestion. Electronically Signed   By: Monte Fantasia M.D.   On: 10/31/2018 05:34   Dg Chest Port 1 View  Result Date: 10/30/2018 CLINICAL DATA:  Pulmonary disease.  Possible ileus. EXAM: PORTABLE CHEST 1 VIEW COMPARISON:  CT 10/26/2018.  Chest x-ray 10/25/2018. FINDINGS: Prior median sternotomy, CABG, and cardiac valve replacement. Stable cardiomegaly. Low lung volumes. Stay chronic bilateral mild interstitial prominence. No focal alveolar infiltrate. No pleural effusion or pneumothorax. IMPRESSION: 1. Prior median sternotomy, CABG, and cardiac valve replacement. Stable cardiomegaly. 2. Low lung volumes with stable chronic bilateral mild interstitial prominence. No acute alveolar infiltrate noted. Electronically Signed   By: Marcello Moores  Register   On: 10/30/2018 09:55   Dg Chest Portable 1 View  Result Date: 10/25/2018 CLINICAL DATA:  Fall EXAM: PORTABLE CHEST 1 VIEW COMPARISON:  08/29/2018 FINDINGS: Mild cardiomegaly with  prosthetic valve  and left atrial closure device. No pulmonary edema or focal airspace consolidation. No pleural effusion or pneumothorax. IMPRESSION: No active cardiopulmonary disease. Electronically Signed   By: Ulyses Jarred M.D.   On: 10/25/2018 21:14   US Abdomen Limited Ruq  Result Date: 10/27/2018 CLINICAL DATA:  Cirrhosis EXAM: ULTRASOUND ABDOMEN LIMITED RIGHT UPPER QUADRANT COMPARISON:  Abdominal ultrasound 12/19/2016, CT abdomen pelvis 10/26/2018 FINDINGS: Gallbladder: Surgically absent. Common bile duct: Diameter: 0.3 cm Liver: No focal lesion identified. The parenchymal echotexture is mildly heterogeneous with mildly nodular contour in keeping with history of cirrhosis. Portal vein is patent on color Doppler imaging with normal direction of blood flow towards the liver. Other: Probable small right pleural effusion. IMPRESSION: 1. Appearance of the liver compatible with cirrhosis. No focal liver lesion identified. 2.  Probable small right pleural effusion. Electronically Signed   By: Audie Pinto M.D.   On: 10/27/2018 11:40       Today   Subjective:   [name] doing well denies any complaints Objective:   Blood pressure 137/64, pulse 86, temperature 98.4 F (36.9 C), temperature source Oral, resp. rate 18, height 5' 8"  (1.727 m), weight 112.3 kg, SpO2 100 %.  .  Intake/Output Summary (Last 24 hours) at 11/07/2018 1231 Last data filed at 11/07/2018 1019 Gross per 24 hour  Intake 720 ml  Output 600 ml  Net 120 ml    Exam VITAL SIGNS: Blood pressure 137/64, pulse 86, temperature 98.4 F (36.9 C), temperature source Oral, resp. rate 18, height 5' 8"  (1.727 m), weight 112.3 kg, SpO2 100 %.  GENERAL:  63 y.o.-year-old patient lying in the bed with no acute distress.  EYES: Pupils equal, round, reactive to light and accommodation. No scleral icterus. Extraocular muscles intact.  HEENT: Head atraumatic, normocephalic. Oropharynx and nasopharynx clear.  NECK:  Supple,  no jugular venous distention. No thyroid enlargement, no tenderness.  LUNGS: Normal breath sounds bilaterally, no wheezing, rales,rhonchi or crepitation. No use of accessory muscles of respiration.  CARDIOVASCULAR: S1, S2 normal. No murmurs, rubs, or gallops.  ABDOMEN: Soft, nontender, nondistended. Bowel sounds present. No organomegaly or mass.  EXTREMITIES: No pedal edema, cyanosis, or clubbing.  NEUROLOGIC: Cranial nerves II through XII are intact. Muscle strength 5/5 in all extremities. Sensation intact. Gait not checked.  PSYCHIATRIC: The patient is alert and oriented x 3.  SKIN: No obvious rash, lesion, or ulcer.   Data Review     CBC w Diff:  Lab Results  Component Value Date   WBC 8.1 11/03/2018   HGB 8.5 (L) 11/03/2018   HGB 7.3 (L) 11/23/2017   HCT 29.1 (L) 11/03/2018   HCT 45.7 08/03/2013   PLT 161 11/03/2018   PLT 118 (L) 08/03/2013   LYMPHOPCT 4 10/27/2018   LYMPHOPCT 30.9 08/03/2013   MONOPCT 7 10/27/2018   MONOPCT 6.7 08/03/2013   EOSPCT 1 10/27/2018   EOSPCT 3.0 08/03/2013   BASOPCT 0 10/27/2018   BASOPCT 0.7 08/03/2013   CMP:  Lab Results  Component Value Date   NA 139 11/06/2018   NA 139 08/03/2013   K 3.8 11/06/2018   K 3.4 (L) 08/03/2013   CL 103 11/06/2018   CL 104 08/03/2013   CO2 27 11/06/2018   CO2 27 08/03/2013   BUN 40 (H) 11/06/2018   BUN 16 08/03/2013   CREATININE 1.92 (H) 11/06/2018   CREATININE 1.19 08/03/2013   PROT 7.0 10/27/2018   PROT 9.3 (H) 03/12/2012   ALBUMIN 2.3 (L) 11/06/2018   ALBUMIN 3.7  03/12/2012   BILITOT 2.5 (H) 10/27/2018   BILITOT 0.5 03/12/2012   ALKPHOS 74 10/27/2018   ALKPHOS 199 (H) 03/12/2012   AST 17 10/27/2018   AST 40 (H) 03/12/2012   ALT 9 10/27/2018   ALT 34 03/12/2012  .  Micro Results Recent Results (from the past 240 hour(s))  SARS CORONAVIRUS 2 (TAT 6-24 HRS) Nasopharyngeal Nasopharyngeal Swab     Status: None   Collection Time: 11/06/18  1:53 PM   Specimen: Nasopharyngeal Swab  Result  Value Ref Range Status   SARS Coronavirus 2 NEGATIVE NEGATIVE Final    Comment: (NOTE) SARS-CoV-2 target nucleic acids are NOT DETECTED. The SARS-CoV-2 RNA is generally detectable in upper and lower respiratory specimens during the acute phase of infection. Negative results do not preclude SARS-CoV-2 infection, do not rule out co-infections with other pathogens, and should not be used as the sole basis for treatment or other patient management decisions. Negative results must be combined with clinical observations, patient history, and epidemiological information. The expected result is Negative. Fact Sheet for Patients: SugarRoll.be Fact Sheet for Healthcare Providers: https://www.woods-mathews.com/ This test is not yet approved or cleared by the [place] FDA and  has been authorized for detection and/or diagnosis of SARS-CoV-2 by FDA under an Emergency Use Authorization (EUA). This EUA will remain  in effect (meaning this test can be used) for the duration of the COVID-19 declaration under Section 56 4(b)(1) of the Act, 21 U.S.C. section 360bbb-3(b)(1), unless the authorization is terminated or revoked sooner. Performed at [facility], [place] [address] [place], [place] [address]         Code Status Orders  (From admission, onward)         Start     Ordered   10/26/18 0046  Full code  Continuous     10/26/18 0048        Code Status History    Date Active Date Inactive Code Status Order ID Comments User Context   08/17/2018 1820 08/22/2018 1805 Full Code [id number]  Dustin Flock, MD Inpatient   06/20/2018 2312 06/27/2018 2119 Full Code [id number]  Mayer Camel, NP ED   01/13/2018 1540 01/16/2018 1859 Full Code [id number]  Loletha Grayer, MD ED   11/03/2017 1758 11/06/2017 2136 Full Code [id number]  Salary, Avel Peace, MD Inpatient   09/26/2017 2353 09/30/2017 1404 Full Code [id number]  Lance Coon, MD Inpatient    08/27/2017 0347 08/29/2017 2054 Full Code [id number]  Harrie Foreman, MD Inpatient   08/07/2017 1704 08/09/2017 2237 Full Code [id number]  Saundra Shelling, MD Inpatient   12/18/2016 1146 12/26/2016 2032 Full Code [id number]  Vaughan Basta, MD Inpatient   12/18/2016 0959 12/18/2016 1146 Full Code [id number]  Vaughan Basta, MD ED   12/04/2015 0509 12/07/2015 1756 Full Code [id number]  Holley Raring, NP ED   10/28/2015 1640 10/29/2015 1711 Full Code [id number]  Bettey Costa, MD Inpatient   08/29/2015 1438 08/30/2015 1750 Full Code [id number]  Harvie Bridge, DO Inpatient   Advance Care Planning Activity           Contact information for follow-up providers    [facility] in 2 week(s).   Contact information: [place] [address] [phone]            Contact information for after-discharge care    [facility] .   Service: Skilled Nursing Contact information: [place]  [phone]                  Discharge Medications   Allergies as of 11/07/2018      Reactions   Latex Anaphylaxis, Rash   Gabapentin Nausea And Vomiting, Other (See Comments)      Medication List    STOP taking these medications   lisinopril 5 MG tablet Commonly known as: ZESTRIL   omeprazole 40 MG capsule Commonly known as: PriLOSEC   thiamine 100 MG tablet     TAKE these medications   atorvastatin 40 MG tablet Commonly known as: LIPITOR Take 40 mg by mouth daily.   carvedilol 3.125 MG tablet Commonly known as: COREG Take 1 tablet (3.125 mg total) by mouth 2 (two) times daily with a meal.   ferrous sulfate 325 (65 FE) MG tablet Take 1 tablet (325 mg total) by mouth 2 (two) times daily with a meal.   fluticasone 50 MCG/ACT nasal spray Commonly known as: FLONASE Place 2 sprays into both nostrils daily.   folic acid 1 MG tablet Commonly known as:  FOLVITE Take 1 tablet (1 mg total) by mouth daily.   furosemide 40 MG tablet Commonly known as: LASIX Take 1 tablet (40 mg total) by mouth 2 (two) times daily. What changed: when to take this   Lantus SoloStar 100 UNIT/ML Solostar Pen Generic drug: Insulin Glargine Inject 10 Units into the skin daily. What changed: how much to take   multivitamin with minerals Tabs tablet Take 1 tablet by mouth daily.   pantoprazole 40 MG tablet Commonly known as: PROTONIX Take 40 mg by mouth 2 (two) times daily.   pregabalin 150 MG capsule Commonly known as: LYRICA Take 150 mg by mouth 2 (two) times daily.   ProAir HFA 108 (90 Base) MCG/ACT inhaler Generic drug: albuterol Inhale 1-2 puffs into the lungs every 4 (four) hours as needed.   spironolactone 25 MG tablet Commonly known as: ALDACTONE Take 12.5 mg by mouth daily.   Symbicort 160-4.5 MCG/ACT inhaler Generic drug: budesonide-formoterol Inhale 2 puffs into the lungs 2 (two) times daily.   torsemide 20 MG tablet Commonly known as: DEMADEX Take 20 mg by mouth daily. Take 2 tablets if weight is greater than 185 lbs. NO tablets if weight is less than 177 lbs.          Total Time in preparing paper work, data evaluation and todays exam - 48 minutes  Dustin Flock M.D on 11/07/2018 at 12:31 PM [place]  [phone]

## 2018-11-07 NOTE — Progress Notes (Deleted)
[facility] [place] [facility] [place], 63 y.o., DOB May 10, 1955, MRN [id number]. 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 Delana Meyer, and CKD to the ED with worsening shortness of breath and fall.  Patient very lethargic unable to provide history, history therefore obtained from patient's chart.  Per ED report patient arrived via EMS from home.  She states she was sitting on toilet and while attempting to get up fell on the floor.  Denies hitting head or loss of consciousness.  Patient states she started feeling very short of breath, per EMS oxygen saturation 88% on room air.  She was also complaining of abdominal pain without nausea or vomiting, hematuria, hemoptysis, hematochezia, melena or any other GI related symptoms.  Patient denies fevers or chills or recent sick contacts.  On arrival to the ED, she was febrile with temp 100.0 with blood pressure 84/55 mm Hg and pulse rate 102  beats/min. There were no focal neurological deficits; she was alert and oriented x4, and appeared lethargic.  Initial labs revealed WBC 14, hemoglobin 5.6, hematocrit 20.4, MCV 78.5, creatinine 1.85 slightly above baseline, glucose 191, troponin 89, lactic acid 3.9 COVID 19-.  Chest x-ray did not show any active cardiopulmonary process.  Hospitalist asked to admit for further management.  Patient was admitted and started on antibiotics there was some concern for sepsis.  Her blood cultures did come back positive.  She was seen in consultation by infectious disease who recommended IV ampicillin, per ID no further antibiotics are needed patient has not had any further fevers or chills.  She also had acute on chronic renal failure that is currently stable.  Patient is very weak and deconditioned need of rehab.           Consults  nephrology, id  Significant Tests:  See full reports for all details     Ct Abdomen Pelvis Wo Contrast  Result Date: 10/26/2018 CLINICAL DATA:  Abdominal pain, gastroenteritis or colitis EXAM: CT ABDOMEN AND PELVIS WITHOUT CONTRAST TECHNIQUE: Multidetector CT imaging of the abdomen and pelvis was performed following the standard protocol without IV contrast. COMPARISON:  June 21, 2018 FINDINGS: Lower chest: There is moderate cardiomegaly. Small bilateral pleural effusions are seen, right greater than left with peripheral atelectasis. Hepatobiliary: Although limited due to the lack of intravenous contrast, normal in appearance without gross focal abnormality. The patient is status post cholecystectomy. No biliary ductal dilation. Pancreas:  Unremarkable.  No surrounding inflammatory changes. Spleen: Normal in size. Although limited due to the lack of intravenous contrast, normal in appearance. Adrenals/Urinary Tract: Both adrenal glands appear normal. Again noted is a 6 cm low-density lesion lower pole of the left  kidney brought to pull left-sided low-density lesions are  seen. Bilateral perinephric stranding is seen. This appears to be more prominent than the prior exam, and is asymmetric, right greater than left. Stomach/Bowel: The stomach and small bowel are normal in size and contour. There is a moderate amount of colonic stool. Scattered colonic diverticula are noted without diverticulitis. Vascular/Lymphatic: There are no enlarged abdominal or pelvic lymph nodes. Scattered aortic atherosclerotic calcifications are seen without aneurysmal dilatation. Reproductive: The uterus and adnexa are unremarkable. Other: There is diffuse extensive anasarca seen. There is a small fat containing anterior umbilical hernia. A small amount of perihepatic ascites is seen. Musculoskeletal: No acute or significant osseous findings. IMPRESSION: 1. Diffuse extensive anasarca. 2. Small bilateral pleural effusions, slightly increased in size from the prior exam. 3. Small amount of perihepatic ascites. 4. Bilateral perinephric stranding, right greater than left, increased from the prior exam. This could be due to acute pyelonephritis. 5. Colonic diverticula without diverticulitis. Electronically Signed   By: Prudencio Pair M.D.   On: 10/26/2018 03:12   Dg Abd 1 View  Result Date: 10/31/2018 CLINICAL DATA:  Ileus EXAM: ABDOMEN - 1 VIEW COMPARISON:  Yesterday FINDINGS: No dilated bowel. Moderate stool intermittently seen along the colon. No concerning mass effect or calcification. High-density material over the pelvis was not seen on abdominal CT from 4 days ago and has progressed by radiography. IMPRESSION: 1. Nonobstructive bowel gas pattern. 2. Moderate stool volume. 3. A high-density structure is progressing through the colon, now at the rectum. Is there a missing dental cap? Electronically Signed   By: Monte Fantasia M.D.   On: 10/31/2018 05:36   Dg Abd 1 View  Result Date: 10/30/2018 CLINICAL DATA:  Pulmonary disease.  Possible ileus. EXAM: ABDOMEN - 1 VIEW COMPARISON:  CT 10/26/2018.  FINDINGS: Prior median sternotomy, CABG, and cardiac valve replacement. Surgical clips right upper quadrant. Air-filled loops of small and large bowel noted. These are nondilated. Follow-up exam suggested to exclude developing bowel distention. No free air. Metallic density of unknown etiology noted over the left abdomen. Degenerative changes thoracolumbar spine and both hips. IMPRESSION: Air-filled loops of small large bowel noted. These are nondilated. Follow-up exam suggest to exclude developing bowel distention. Electronically Signed   By: Marcello Moores  Register   On: 10/30/2018 09:57   Dg Chest Port 1 View  Result Date: 10/31/2018 CLINICAL DATA:  Acute respiratory failure EXAM: PORTABLE CHEST 1 VIEW COMPARISON:  Yesterday FINDINGS: Chronic cardiomegaly. 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 Vascular congestion. Mild reticulation in the left mid lung, likely scarring. There is no edema, consolidation, effusion, or pneumothorax. IMPRESSION: Cardiomegaly and vascular congestion. Electronically Signed   By: Monte Fantasia M.D.   On: 10/31/2018 05:34   Dg Chest Port 1 View  Result Date: 10/30/2018 CLINICAL DATA:  Pulmonary disease.  Possible ileus. EXAM: PORTABLE CHEST 1 VIEW COMPARISON:  CT 10/26/2018.  Chest x-ray 10/25/2018. FINDINGS: Prior median sternotomy, CABG, and cardiac valve replacement. Stable cardiomegaly. Low lung volumes. Stay chronic bilateral mild interstitial prominence. No focal alveolar infiltrate. No pleural effusion or pneumothorax. IMPRESSION: 1. Prior median sternotomy, CABG, and cardiac valve replacement. Stable cardiomegaly. 2. Low lung volumes with stable chronic bilateral mild interstitial prominence. No acute alveolar infiltrate noted. Electronically Signed   By: Marcello Moores  Register   On: 10/30/2018 09:55   Dg Chest Portable 1 View  Result Date: 10/25/2018 CLINICAL DATA:  Fall EXAM: PORTABLE CHEST 1 VIEW COMPARISON:  08/29/2018 FINDINGS: Mild cardiomegaly with  prosthetic valve  and left atrial closure device. No pulmonary edema or focal airspace consolidation. No pleural effusion or pneumothorax. IMPRESSION: No active cardiopulmonary disease. Electronically Signed   By: Ulyses Jarred M.D.   On: 10/25/2018 21:14   US Abdomen Limited Ruq  Result Date: 10/27/2018 CLINICAL DATA:  Cirrhosis EXAM: ULTRASOUND ABDOMEN LIMITED RIGHT UPPER QUADRANT COMPARISON:  Abdominal ultrasound 12/19/2016, CT abdomen pelvis 10/26/2018 FINDINGS: Gallbladder: Surgically absent. Common bile duct: Diameter: 0.3 cm Liver: No focal lesion identified. The parenchymal echotexture is mildly heterogeneous with mildly nodular contour in keeping with history of cirrhosis. Portal vein is patent on color Doppler imaging with normal direction of blood flow towards the liver. Other: Probable small right pleural effusion. IMPRESSION: 1. Appearance of the liver compatible with cirrhosis. No focal liver lesion identified. 2.  Probable small right pleural effusion. Electronically Signed   By: Audie Pinto M.D.   On: 10/27/2018 11:40       Today   Subjective:   [name] doing well denies any complaints Objective:   Blood pressure 137/64, pulse 86, temperature 98.4 F (36.9 C), temperature source Oral, resp. rate 18, height 5' 8"  (1.727 m), weight 112.3 kg, SpO2 100 %.  .  Intake/Output Summary (Last 24 hours) at 11/07/2018 1123 Last data filed at 11/07/2018 1019 Gross per 24 hour  Intake 720 ml  Output 600 ml  Net 120 ml    Exam VITAL SIGNS: Blood pressure 137/64, pulse 86, temperature 98.4 F (36.9 C), temperature source Oral, resp. rate 18, height 5' 8"  (1.727 m), weight 112.3 kg, SpO2 100 %.  GENERAL:  63 y.o.-year-old patient lying in the bed with no acute distress.  EYES: Pupils equal, round, reactive to light and accommodation. No scleral icterus. Extraocular muscles intact.  HEENT: Head atraumatic, normocephalic. Oropharynx and nasopharynx clear.  NECK:  Supple,  no jugular venous distention. No thyroid enlargement, no tenderness.  LUNGS: Normal breath sounds bilaterally, no wheezing, rales,rhonchi or crepitation. No use of accessory muscles of respiration.  CARDIOVASCULAR: S1, S2 normal. No murmurs, rubs, or gallops.  ABDOMEN: Soft, nontender, nondistended. Bowel sounds present. No organomegaly or mass.  EXTREMITIES: No pedal edema, cyanosis, or clubbing.  NEUROLOGIC: Cranial nerves II through XII are intact. Muscle strength 5/5 in all extremities. Sensation intact. Gait not checked.  PSYCHIATRIC: The patient is alert and oriented x 3.  SKIN: No obvious rash, lesion, or ulcer.   Data Review     CBC w Diff:  Lab Results  Component Value Date   WBC 8.1 11/03/2018   HGB 8.5 (L) 11/03/2018   HGB 7.3 (L) 11/23/2017   HCT 29.1 (L) 11/03/2018   HCT 45.7 08/03/2013   PLT 161 11/03/2018   PLT 118 (L) 08/03/2013   LYMPHOPCT 4 10/27/2018   LYMPHOPCT 30.9 08/03/2013   MONOPCT 7 10/27/2018   MONOPCT 6.7 08/03/2013   EOSPCT 1 10/27/2018   EOSPCT 3.0 08/03/2013   BASOPCT 0 10/27/2018   BASOPCT 0.7 08/03/2013   CMP:  Lab Results  Component Value Date   NA 139 11/06/2018   NA 139 08/03/2013   K 3.8 11/06/2018   K 3.4 (L) 08/03/2013   CL 103 11/06/2018   CL 104 08/03/2013   CO2 27 11/06/2018   CO2 27 08/03/2013   BUN 40 (H) 11/06/2018   BUN 16 08/03/2013   CREATININE 1.92 (H) 11/06/2018   CREATININE 1.19 08/03/2013   PROT 7.0 10/27/2018   PROT 9.3 (H) 03/12/2012   ALBUMIN 2.3 (L) 11/06/2018   ALBUMIN 3.7  03/12/2012   BILITOT 2.5 (H) 10/27/2018   BILITOT 0.5 03/12/2012   ALKPHOS 74 10/27/2018   ALKPHOS 199 (H) 03/12/2012   AST 17 10/27/2018   AST 40 (H) 03/12/2012   ALT 9 10/27/2018   ALT 34 03/12/2012  .  Micro Results Recent Results (from the past 240 hour(s))  SARS CORONAVIRUS 2 (TAT 6-24 HRS) Nasopharyngeal Nasopharyngeal Swab     Status: None   Collection Time: 11/06/18  1:53 PM   Specimen: Nasopharyngeal Swab  Result  Value Ref Range Status   SARS Coronavirus 2 NEGATIVE NEGATIVE Final    Comment: (NOTE) SARS-CoV-2 target nucleic acids are NOT DETECTED. The SARS-CoV-2 RNA is generally detectable in upper and lower respiratory specimens during the acute phase of infection. Negative results do not preclude SARS-CoV-2 infection, do not rule out co-infections with other pathogens, and should not be used as the sole basis for treatment or other patient management decisions. Negative results must be combined with clinical observations, patient history, and epidemiological information. The expected result is Negative. Fact Sheet for Patients: SugarRoll.be Fact Sheet for Healthcare Providers: https://www.woods-mathews.com/ This test is not yet approved or cleared by the [place] FDA and  has been authorized for detection and/or diagnosis of SARS-CoV-2 by FDA under an Emergency Use Authorization (EUA). This EUA will remain  in effect (meaning this test can be used) for the duration of the COVID-19 declaration under Section 56 4(b)(1) of the Act, 21 U.S.C. section 360bbb-3(b)(1), unless the authorization is terminated or revoked sooner. Performed at [facility], [address] [place], [place] [address]         Code Status Orders  (From admission, onward)         Start     Ordered   10/26/18 0046  Full code  Continuous     10/26/18 0048        Code Status History    Date Active Date Inactive Code Status Order ID Comments User Context   08/17/2018 1820 08/22/2018 1805 Full Code [id number]  Dustin Flock, MD Inpatient   06/20/2018 2312 06/27/2018 2119 Full Code [id number]  Mayer Camel, NP ED   01/13/2018 1540 01/16/2018 1859 Full Code [id number]  Loletha Grayer, MD ED   11/03/2017 1758 11/06/2017 2136 Full Code [id number]  Salary, Avel Peace, MD Inpatient   09/26/2017 2353 09/30/2017 1404 Full Code [id number]  Lance Coon, MD Inpatient    08/27/2017 0347 08/29/2017 2054 Full Code [id number]  Harrie Foreman, MD Inpatient   08/07/2017 1704 08/09/2017 2237 Full Code [id number]  Saundra Shelling, MD Inpatient   12/18/2016 1146 12/26/2016 2032 Full Code [id number]  Vaughan Basta, MD Inpatient   12/18/2016 0959 12/18/2016 1146 Full Code [id number]  Vaughan Basta, MD ED   12/04/2015 0509 12/07/2015 1756 Full Code [id number]  Holley Raring, NP ED   10/28/2015 1640 10/29/2015 1711 Full Code [id number]  Bettey Costa, MD Inpatient   08/29/2015 1438 08/30/2015 1750 Full Code [id number]  Harvie Bridge, DO Inpatient   Advance Care Planning Activity           Contact information for follow-up providers    [facility] Follow up in 2 week(s).   Contact information: [place] [address] [phone]            Contact information for after-discharge care    [facility] .   Service: Skilled Nursing Contact information: [place]  [address] [phone]                  Discharge Medications   Allergies as of 11/07/2018      Reactions   Latex Anaphylaxis, Rash   Gabapentin Nausea And Vomiting, Other (See Comments)      Medication List    STOP taking these medications   lisinopril 5 MG tablet Commonly known as: ZESTRIL   omeprazole 40 MG capsule Commonly known as: PriLOSEC   thiamine 100 MG tablet     TAKE these medications   atorvastatin 40 MG tablet Commonly known as: LIPITOR Take 40 mg by mouth daily.   carvedilol 3.125 MG tablet Commonly known as: COREG Take 1 tablet (3.125 mg total) by mouth 2 (two) times daily with a meal.   ferrous sulfate 325 (65 FE) MG tablet Take 1 tablet (325 mg total) by mouth 2 (two) times daily with a meal.   fluticasone 50 MCG/ACT nasal spray Commonly known as: FLONASE Place 2 sprays into both nostrils daily.   folic acid 1 MG tablet Commonly known as:  FOLVITE Take 1 tablet (1 mg total) by mouth daily.   furosemide 40 MG tablet Commonly known as: LASIX Take 1 tablet (40 mg total) by mouth 2 (two) times daily. What changed: when to take this   Lantus SoloStar 100 UNIT/ML Solostar Pen Generic drug: Insulin Glargine Inject 10 Units into the skin daily. What changed: how much to take   multivitamin with minerals Tabs tablet Take 1 tablet by mouth daily.   pantoprazole 40 MG tablet Commonly known as: PROTONIX Take 40 mg by mouth 2 (two) times daily.   pregabalin 150 MG capsule Commonly known as: LYRICA Take 150 mg by mouth 2 (two) times daily.   ProAir HFA 108 (90 Base) MCG/ACT inhaler Generic drug: albuterol Inhale 1-2 puffs into the lungs every 4 (four) hours as needed.   spironolactone 25 MG tablet Commonly known as: ALDACTONE Take 12.5 mg by mouth daily.   Symbicort 160-4.5 MCG/ACT inhaler Generic drug: budesonide-formoterol Inhale 2 puffs into the lungs 2 (two) times daily.   torsemide 20 MG tablet Commonly known as: DEMADEX Take 20 mg by mouth daily. Take 2 tablets if weight is greater than 185 lbs. NO tablets if weight is less than 177 lbs.          Total Time in preparing paper work, data evaluation and todays exam - 63 minutes  Dustin Flock M.D on 11/07/2018 at 11:23 AM [place]  [phone]

## 2018-11-15 ENCOUNTER — Ambulatory Visit: Payer: Medicaid Other | Admitting: Gastroenterology

## 2018-11-15 ENCOUNTER — Encounter: Payer: Self-pay | Admitting: Gastroenterology

## 2018-11-20 ENCOUNTER — Inpatient Hospital Stay (HOSPITAL_COMMUNITY)
Admission: EM | Admit: 2018-11-20 | Discharge: 2018-12-08 | DRG: 378 | Disposition: A | Discharge status: Skilled Nursing Facility | Payer: Medicaid Other | Source: Skilled Nursing Facility | Attending: Internal Medicine | Admitting: Internal Medicine

## 2018-11-20 ENCOUNTER — Emergency Department (HOSPITAL_COMMUNITY): Payer: Medicaid Other

## 2018-11-20 ENCOUNTER — Encounter (HOSPITAL_COMMUNITY): Payer: Self-pay | Admitting: Emergency Medicine

## 2018-11-20 DIAGNOSIS — J449 Chronic obstructive pulmonary disease, unspecified: Secondary | ICD-10-CM | POA: Diagnosis not present

## 2018-11-20 DIAGNOSIS — R040 Epistaxis: Secondary | ICD-10-CM | POA: Diagnosis not present

## 2018-11-20 DIAGNOSIS — K317 Polyp of stomach and duodenum: Secondary | ICD-10-CM | POA: Diagnosis present

## 2018-11-20 DIAGNOSIS — Z4659 Encounter for fitting and adjustment of other gastrointestinal appliance and device: Secondary | ICD-10-CM

## 2018-11-20 DIAGNOSIS — I2510 Atherosclerotic heart disease of native coronary artery without angina pectoris: Secondary | ICD-10-CM | POA: Diagnosis present

## 2018-11-20 DIAGNOSIS — I110 Hypertensive heart disease with heart failure: Secondary | ICD-10-CM

## 2018-11-20 DIAGNOSIS — E870 Hyperosmolality and hypernatremia: Secondary | ICD-10-CM | POA: Diagnosis present

## 2018-11-20 DIAGNOSIS — D689 Coagulation defect, unspecified: Secondary | ICD-10-CM | POA: Diagnosis present

## 2018-11-20 DIAGNOSIS — E860 Dehydration: Secondary | ICD-10-CM | POA: Diagnosis present

## 2018-11-20 DIAGNOSIS — Z79899 Other long term (current) drug therapy: Secondary | ICD-10-CM

## 2018-11-20 DIAGNOSIS — I5020 Unspecified systolic (congestive) heart failure: Secondary | ICD-10-CM

## 2018-11-20 DIAGNOSIS — I252 Old myocardial infarction: Secondary | ICD-10-CM

## 2018-11-20 DIAGNOSIS — E1142 Type 2 diabetes mellitus with diabetic polyneuropathy: Secondary | ICD-10-CM | POA: Diagnosis present

## 2018-11-20 DIAGNOSIS — Z803 Family history of malignant neoplasm of breast: Secondary | ICD-10-CM

## 2018-11-20 DIAGNOSIS — K5521 Angiodysplasia of colon with hemorrhage: Secondary | ICD-10-CM | POA: Diagnosis not present

## 2018-11-20 DIAGNOSIS — I081 Rheumatic disorders of both mitral and tricuspid valves: Secondary | ICD-10-CM

## 2018-11-20 DIAGNOSIS — R4182 Altered mental status, unspecified: Secondary | ICD-10-CM | POA: Diagnosis not present

## 2018-11-20 DIAGNOSIS — Z7951 Long term (current) use of inhaled steroids: Secondary | ICD-10-CM

## 2018-11-20 DIAGNOSIS — D649 Anemia, unspecified: Secondary | ICD-10-CM

## 2018-11-20 DIAGNOSIS — I878 Other specified disorders of veins: Secondary | ICD-10-CM | POA: Diagnosis present

## 2018-11-20 DIAGNOSIS — Z8249 Family history of ischemic heart disease and other diseases of the circulatory system: Secondary | ICD-10-CM

## 2018-11-20 DIAGNOSIS — K2970 Gastritis, unspecified, without bleeding: Secondary | ICD-10-CM | POA: Diagnosis not present

## 2018-11-20 DIAGNOSIS — E873 Alkalosis: Secondary | ICD-10-CM | POA: Diagnosis present

## 2018-11-20 DIAGNOSIS — R195 Other fecal abnormalities: Secondary | ICD-10-CM

## 2018-11-20 DIAGNOSIS — E1122 Type 2 diabetes mellitus with diabetic chronic kidney disease: Secondary | ICD-10-CM | POA: Diagnosis present

## 2018-11-20 DIAGNOSIS — K2971 Gastritis, unspecified, with bleeding: Secondary | ICD-10-CM | POA: Diagnosis present

## 2018-11-20 DIAGNOSIS — Z794 Long term (current) use of insulin: Secondary | ICD-10-CM

## 2018-11-20 DIAGNOSIS — G9340 Encephalopathy, unspecified: Secondary | ICD-10-CM

## 2018-11-20 DIAGNOSIS — E875 Hyperkalemia: Secondary | ICD-10-CM | POA: Diagnosis present

## 2018-11-20 DIAGNOSIS — N189 Chronic kidney disease, unspecified: Secondary | ICD-10-CM | POA: Diagnosis not present

## 2018-11-20 DIAGNOSIS — E785 Hyperlipidemia, unspecified: Secondary | ICD-10-CM | POA: Diagnosis not present

## 2018-11-20 DIAGNOSIS — Z8673 Personal history of transient ischemic attack (TIA), and cerebral infarction without residual deficits: Secondary | ICD-10-CM

## 2018-11-20 DIAGNOSIS — D500 Iron deficiency anemia secondary to blood loss (chronic): Secondary | ICD-10-CM | POA: Diagnosis not present

## 2018-11-20 DIAGNOSIS — R339 Retention of urine, unspecified: Secondary | ICD-10-CM | POA: Diagnosis not present

## 2018-11-20 DIAGNOSIS — F17210 Nicotine dependence, cigarettes, uncomplicated: Secondary | ICD-10-CM | POA: Clinically undetermined

## 2018-11-20 DIAGNOSIS — G9341 Metabolic encephalopathy: Secondary | ICD-10-CM | POA: Diagnosis not present

## 2018-11-20 DIAGNOSIS — I130 Hypertensive heart and chronic kidney disease with heart failure and stage 1 through stage 4 chronic kidney disease, or unspecified chronic kidney disease: Secondary | ICD-10-CM | POA: Diagnosis present

## 2018-11-20 DIAGNOSIS — Q2733 Arteriovenous malformation of digestive system vessel: Secondary | ICD-10-CM | POA: Diagnosis not present

## 2018-11-20 DIAGNOSIS — K7290 Hepatic failure, unspecified without coma: Secondary | ICD-10-CM | POA: Diagnosis present

## 2018-11-20 DIAGNOSIS — K922 Gastrointestinal hemorrhage, unspecified: Secondary | ICD-10-CM

## 2018-11-20 DIAGNOSIS — L97929 Non-pressure chronic ulcer of unspecified part of left lower leg with unspecified severity: Secondary | ICD-10-CM | POA: Diagnosis present

## 2018-11-20 DIAGNOSIS — I248 Other forms of acute ischemic heart disease: Secondary | ICD-10-CM | POA: Diagnosis present

## 2018-11-20 DIAGNOSIS — L89899 Pressure ulcer of other site, unspecified stage: Secondary | ICD-10-CM

## 2018-11-20 DIAGNOSIS — D696 Thrombocytopenia, unspecified: Secondary | ICD-10-CM | POA: Diagnosis present

## 2018-11-20 DIAGNOSIS — G4730 Sleep apnea, unspecified: Secondary | ICD-10-CM

## 2018-11-20 DIAGNOSIS — R630 Anorexia: Secondary | ICD-10-CM | POA: Diagnosis not present

## 2018-11-20 DIAGNOSIS — R278 Other lack of coordination: Secondary | ICD-10-CM | POA: Clinically undetermined

## 2018-11-20 DIAGNOSIS — K439 Ventral hernia without obstruction or gangrene: Secondary | ICD-10-CM | POA: Diagnosis present

## 2018-11-20 DIAGNOSIS — Z6837 Body mass index (BMI) 37.0-37.9, adult: Secondary | ICD-10-CM | POA: Diagnosis not present

## 2018-11-20 DIAGNOSIS — I493 Ventricular premature depolarization: Secondary | ICD-10-CM | POA: Diagnosis not present

## 2018-11-20 DIAGNOSIS — Z20828 Contact with and (suspected) exposure to other viral communicable diseases: Secondary | ICD-10-CM | POA: Diagnosis present

## 2018-11-20 DIAGNOSIS — Z89412 Acquired absence of left great toe: Secondary | ICD-10-CM

## 2018-11-20 DIAGNOSIS — I452 Bifascicular block: Secondary | ICD-10-CM | POA: Diagnosis not present

## 2018-11-20 DIAGNOSIS — E119 Type 2 diabetes mellitus without complications: Secondary | ICD-10-CM

## 2018-11-20 DIAGNOSIS — K7400 Hepatic fibrosis, unspecified: Secondary | ICD-10-CM | POA: Diagnosis present

## 2018-11-20 DIAGNOSIS — B182 Chronic viral hepatitis C: Secondary | ICD-10-CM | POA: Diagnosis not present

## 2018-11-20 DIAGNOSIS — Z91040 Latex allergy status: Secondary | ICD-10-CM

## 2018-11-20 DIAGNOSIS — K2991 Gastroduodenitis, unspecified, with bleeding: Secondary | ICD-10-CM | POA: Diagnosis present

## 2018-11-20 DIAGNOSIS — Z888 Allergy status to other drugs, medicaments and biological substances status: Secondary | ICD-10-CM

## 2018-11-20 DIAGNOSIS — F329 Major depressive disorder, single episode, unspecified: Secondary | ICD-10-CM | POA: Diagnosis present

## 2018-11-20 DIAGNOSIS — N183 Chronic kidney disease, stage 3 unspecified: Secondary | ICD-10-CM | POA: Diagnosis present

## 2018-11-20 DIAGNOSIS — L97919 Non-pressure chronic ulcer of unspecified part of right lower leg with unspecified severity: Secondary | ICD-10-CM | POA: Diagnosis present

## 2018-11-20 DIAGNOSIS — N179 Acute kidney failure, unspecified: Secondary | ICD-10-CM | POA: Diagnosis present

## 2018-11-20 DIAGNOSIS — I5022 Chronic systolic (congestive) heart failure: Secondary | ICD-10-CM | POA: Diagnosis present

## 2018-11-20 DIAGNOSIS — I10 Essential (primary) hypertension: Secondary | ICD-10-CM | POA: Diagnosis not present

## 2018-11-20 DIAGNOSIS — K3189 Other diseases of stomach and duodenum: Secondary | ICD-10-CM | POA: Diagnosis present

## 2018-11-20 DIAGNOSIS — Z8541 Personal history of malignant neoplasm of cervix uteri: Secondary | ICD-10-CM

## 2018-11-20 DIAGNOSIS — Z8711 Personal history of peptic ulcer disease: Secondary | ICD-10-CM

## 2018-11-20 DIAGNOSIS — E669 Obesity, unspecified: Secondary | ICD-10-CM | POA: Diagnosis present

## 2018-11-20 DIAGNOSIS — G92 Toxic encephalopathy: Secondary | ICD-10-CM | POA: Diagnosis not present

## 2018-11-20 DIAGNOSIS — Z9889 Other specified postprocedural states: Secondary | ICD-10-CM

## 2018-11-20 DIAGNOSIS — K7200 Acute and subacute hepatic failure without coma: Secondary | ICD-10-CM | POA: Diagnosis not present

## 2018-11-20 DIAGNOSIS — I052 Rheumatic mitral stenosis with insufficiency: Secondary | ICD-10-CM | POA: Diagnosis not present

## 2018-11-20 DIAGNOSIS — K2990 Gastroduodenitis, unspecified, without bleeding: Secondary | ICD-10-CM | POA: Diagnosis not present

## 2018-11-20 DIAGNOSIS — K31819 Angiodysplasia of stomach and duodenum without bleeding: Secondary | ICD-10-CM | POA: Diagnosis not present

## 2018-11-20 DIAGNOSIS — Z833 Family history of diabetes mellitus: Secondary | ICD-10-CM

## 2018-11-20 DIAGNOSIS — D62 Acute posthemorrhagic anemia: Secondary | ICD-10-CM | POA: Diagnosis present

## 2018-11-20 DIAGNOSIS — R29810 Facial weakness: Secondary | ICD-10-CM | POA: Diagnosis present

## 2018-11-20 DIAGNOSIS — R400 Somnolence: Secondary | ICD-10-CM | POA: Diagnosis not present

## 2018-11-20 LAB — URINALYSIS, ROUTINE W REFLEX MICROSCOPIC
Bilirubin Urine: NEGATIVE
Glucose, UA: NEGATIVE mg/dL
Hgb urine dipstick: NEGATIVE
Ketones, ur: NEGATIVE mg/dL
Leukocytes,Ua: NEGATIVE
Nitrite: NEGATIVE
Protein, ur: NEGATIVE mg/dL
Specific Gravity, Urine: 1.009 (ref 1.005–1.030)
pH: 7 (ref 5.0–8.0)

## 2018-11-20 LAB — CBC WITH DIFFERENTIAL/PLATELET
Abs Immature Granulocytes: 0 10*3/uL (ref 0.00–0.07)
Basophils Absolute: 0 10*3/uL (ref 0.0–0.1)
Basophils Relative: 1 %
Eosinophils Absolute: 0.2 10*3/uL (ref 0.0–0.5)
Eosinophils Relative: 4 %
HCT: 23 % — ABNORMAL LOW (ref 36.0–46.0)
Hemoglobin: 6.3 g/dL — CL (ref 12.0–15.0)
Lymphocytes Relative: 15 %
Lymphs Abs: 0.7 10*3/uL (ref 0.7–4.0)
MCH: 26.1 pg (ref 26.0–34.0)
MCHC: 27.4 g/dL — ABNORMAL LOW (ref 30.0–36.0)
MCV: 95.4 fL (ref 80.0–100.0)
Monocytes Absolute: 0.1 10*3/uL (ref 0.1–1.0)
Monocytes Relative: 3 %
Neutro Abs: 3.6 10*3/uL (ref 1.7–7.7)
Neutrophils Relative %: 77 %
Platelets: 120 10*3/uL — ABNORMAL LOW (ref 150–400)
RBC: 2.41 MIL/uL — ABNORMAL LOW (ref 3.87–5.11)
RDW: 28.2 % — ABNORMAL HIGH (ref 11.5–15.5)
WBC: 4.7 10*3/uL (ref 4.0–10.5)
nRBC: 0 % (ref 0.0–0.2)
nRBC: 1 /100 WBC — ABNORMAL HIGH

## 2018-11-20 LAB — PREPARE RBC (CROSSMATCH)

## 2018-11-20 LAB — COMPREHENSIVE METABOLIC PANEL
ALT: 11 U/L (ref 0–44)
AST: 19 U/L (ref 15–41)
Albumin: 2.7 g/dL — ABNORMAL LOW (ref 3.5–5.0)
Alkaline Phosphatase: 140 U/L — ABNORMAL HIGH (ref 38–126)
Anion gap: 12 (ref 5–15)
BUN: 60 mg/dL — ABNORMAL HIGH (ref 8–23)
CO2: 24 mmol/L (ref 22–32)
Calcium: 8.8 mg/dL — ABNORMAL LOW (ref 8.9–10.3)
Chloride: 111 mmol/L (ref 98–111)
Creatinine, Ser: 2.72 mg/dL — ABNORMAL HIGH (ref 0.44–1.00)
GFR calc Af Amer: 21 mL/min — ABNORMAL LOW (ref >60)
GFR calc non Af Amer: 18 mL/min — ABNORMAL LOW (ref >60)
Glucose, Bld: 146 mg/dL — ABNORMAL HIGH (ref 70–99)
Potassium: 4.7 mmol/L (ref 3.5–5.1)
Sodium: 147 mmol/L — ABNORMAL HIGH (ref 135–145)
Total Bilirubin: 1.3 mg/dL — ABNORMAL HIGH (ref 0.3–1.2)
Total Protein: 8.1 g/dL (ref 6.5–8.1)

## 2018-11-20 LAB — RAPID URINE DRUG SCREEN, HOSP PERFORMED
Amphetamines: NOT DETECTED
Barbiturates: NOT DETECTED
Benzodiazepines: NOT DETECTED
Cocaine: NOT DETECTED
Opiates: NOT DETECTED
Tetrahydrocannabinol: NOT DETECTED

## 2018-11-20 LAB — PROCALCITONIN: Procalcitonin: <0.1 ng/mL

## 2018-11-20 LAB — ABO/RH: ABO/RH(D): O POS

## 2018-11-20 LAB — CBG MONITORING, ED
Glucose-Capillary: 131 mg/dL — ABNORMAL HIGH (ref 70–99)
Glucose-Capillary: 139 mg/dL — ABNORMAL HIGH (ref 70–99)

## 2018-11-20 LAB — POC OCCULT BLOOD, ED: Fecal Occult Bld: POSITIVE — AB

## 2018-11-20 LAB — SARS CORONAVIRUS 2 (TAT 6-24 HRS): SARS Coronavirus 2: NEGATIVE

## 2018-11-20 MED ORDER — SODIUM CHLORIDE 0.9 % IV SOLN: 10.0000 mL/h | Freq: Once | INTRAVENOUS | Status: DC

## 2018-11-20 MED ORDER — INSULIN ASPART 100 UNIT/ML ~~LOC~~ SOLN: 0.0000 [IU] | Freq: Every day | SUBCUTANEOUS | Status: DC

## 2018-11-20 MED ORDER — INSULIN ASPART 100 UNIT/ML ~~LOC~~ SOLN
0.0000 [IU] | Freq: Three times a day (TID) | SUBCUTANEOUS | Status: DC
  Administered 2018-11-21 – 2018-11-25 (×10): 2 [IU] via SUBCUTANEOUS
  Administered 2018-11-25 (×2): 3 [IU] via SUBCUTANEOUS
  Administered 2018-11-26: 17:00:00 2 [IU] via SUBCUTANEOUS
  Administered 2018-11-26: 09:00:00 3 [IU] via SUBCUTANEOUS
  Administered 2018-11-27 – 2018-11-28 (×4): 2 [IU] via SUBCUTANEOUS
  Administered 2018-11-29 (×2): 3 [IU] via SUBCUTANEOUS
  Administered 2018-11-29: 2 [IU] via SUBCUTANEOUS
  Administered 2018-11-30: 3 [IU] via SUBCUTANEOUS
  Administered 2018-11-30: 18:00:00 2 [IU] via SUBCUTANEOUS
  Administered 2018-11-30 – 2018-12-01 (×3): 3 [IU] via SUBCUTANEOUS
  Administered 2018-12-01: 09:00:00 2 [IU] via SUBCUTANEOUS
  Administered 2018-12-02: 14:00:00 3 [IU] via SUBCUTANEOUS
  Administered 2018-12-02: 18:00:00 1 [IU] via SUBCUTANEOUS
  Administered 2018-12-02 – 2018-12-03 (×2): 3 [IU] via SUBCUTANEOUS
  Administered 2018-12-04 (×2): 2 [IU] via SUBCUTANEOUS
  Administered 2018-12-06 (×2): 3 [IU] via SUBCUTANEOUS
  Administered 2018-12-07 (×3): 2 [IU] via SUBCUTANEOUS

## 2018-11-20 MED ORDER — PANTOPRAZOLE SODIUM 40 MG IV SOLR
40.0000 mg | Freq: Two times a day (BID) | INTRAVENOUS | Status: DC
  Administered 2018-11-21: 40 mg via INTRAVENOUS
  Filled 2018-11-20 (×2): qty 40

## 2018-11-20 MED ORDER — CARVEDILOL 3.125 MG PO TABS
3.1250 mg | ORAL_TABLET | Freq: Two times a day (BID) | ORAL | Status: DC
  Administered 2018-11-24 – 2018-12-07 (×22): 3.125 mg via ORAL
  Filled 2018-11-20 (×23): qty 1

## 2018-11-20 MED ORDER — SODIUM CHLORIDE 0.9 % IV SOLN: INTRAVENOUS | Status: DC

## 2018-11-20 MED ORDER — SODIUM CHLORIDE 0.9 % IV SOLN
INTRAVENOUS | Status: DC
  Administered 2018-11-20: 18:00:00 via INTRAVENOUS

## 2018-11-20 MED ORDER — SODIUM CHLORIDE 0.9 % IV SOLN
510.0000 mg | Freq: Once | INTRAVENOUS | Status: AC
  Administered 2018-11-20: 510 mg via INTRAVENOUS
  Filled 2018-11-20: qty 17

## 2018-11-20 MED ORDER — SODIUM CHLORIDE 0.9 % IV SOLN
80.0000 mg | Freq: Once | INTRAVENOUS | Status: AC
  Administered 2018-11-20: 80 mg via INTRAVENOUS
  Filled 2018-11-20: qty 80

## 2018-11-20 MED ORDER — ONDANSETRON HCL 4 MG/2ML IJ SOLN
4.0000 mg | Freq: Once | INTRAMUSCULAR | Status: AC
  Administered 2018-11-20: 4 mg via INTRAVENOUS
  Filled 2018-11-20: qty 2

## 2018-11-20 MED ORDER — LACTATED RINGERS IV SOLN
INTRAVENOUS | Status: DC
  Administered 2018-11-20: 20:00:00 via INTRAVENOUS

## 2018-11-20 NOTE — ED Provider Notes (Signed)
[facility] EMERGENCY DEPARTMENT Provider Note   CSN: [id number] Arrival date & time: 11/20/18  1147     History   Chief Complaint Chief Complaint  Patient presents with   Altered Mental Status    HPI [name] is a 63 y.o. female.     HPI   63 year old female coming from nursing facility.  Change in mental status noted yesterday.  Apparently persistent since then.  Also this morning blood noted in stool.  Patient is currently not really responding to questioning.  She will occasionally say "yes" or "no" and sometimes laughing inappropriately but not otherwise following commands or responding to questions.  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 Elevated troponin due to demand Diabetes type 2 Chronic systolic congestive heart failure COPD without exacerbation Acute on chronic kidney disease stage III Anasarca Generalized weakness Hyperkalemia  Past Medical History:  Diagnosis Date   Cervical cancer (HCC)    CHF (congestive heart failure) (HCC)    Chronic anemia    COPD (chronic obstructive pulmonary disease) (HCC)    Diabetes mellitus without complication ([place])    Gastric ulcer    NSTEMI (non-ST elevated myocardial infarction) ([place])    Upper GI bleed     Patient Active Problem List   Diagnosis Date Noted   Pressure injury of skin 10/26/2018   Anxiety 08/15/2018   Chronic back pain 08/15/2018   GERD (gastroesophageal reflux disease) 08/15/2018   History of carpal tunnel syndrome 08/15/2018   HNP (herniated nucleus pulposus), lumbar 08/15/2018   Hyperlipidemia 08/15/2018   Hypertension 08/15/2018   Insomnia 08/15/2018   Left ventricular hypertrophy 08/15/2018   Obesity 08/15/2018   Sleep apnea 08/15/2018   Severe anemia 06/20/2018    Diabetic polyneuropathy associated with type 2 diabetes mellitus ([place]) 02/23/2018   Moderate tricuspid regurgitation 02/18/2018   Acute on chronic diastolic CHF (congestive heart failure) ([place]) 02/08/2018   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   Depression 02/05/2018   Generalized abdominal pain 02/05/2018   CHF (congestive heart failure) ([place]) 01/13/2018   B12 deficiency 12/07/2017   Iron deficiency anemia due to chronic blood loss 12/07/2017   Folate deficiency 12/07/2017   GIB (gastrointestinal bleeding) 11/03/2017   COPD (chronic obstructive pulmonary disease) ([place]) 09/26/2017   Symptomatic anemia 09/26/2017   Acute on chronic systolic CHF (congestive heart failure) ([place]) 08/27/2017   Anemia    Weakness    GI bleed 88/91/6945   Chronic systolic heart failure ([place]) 12/28/2016   Diabetes ([place]) 12/28/2016   Tobacco use 12/28/2016   NSTEMI (non-ST elevated myocardial infarction) ([place])    Sepsis ([place]) 12/18/2016   CHF exacerbation ([place]) 12/04/2015   PNA (pneumonia) 10/28/2015   COPD exacerbation ([place]) 08/29/2015   Chronic hepatitis C without hepatic coma ([place]) 05/21/2014   CAD (coronary artery disease) 07/19/2013   Right arm pain 06/26/2013   Cervical pain (neck) 03/28/2013   Radiculopathy affecting upper extremity 03/28/2013   Impaired renal function 12/21/2012   Abnormal mammogram 12/20/2012   Diabetes mellitus type 2, uncontrolled ([place]) 12/20/2012    Past Surgical History:  Procedure Laterality Date   AMPUTATION Left 06/24/2018   Procedure: AMPUTATION GREAT TOE;  Surgeon: Samara Deist, DPM;  Location: [facility] ORS;  Service: Podiatry;  Laterality: Left;   APPENDECTOMY     CARDIAC CATHETERIZATION     CHOLECYSTECTOMY  COLONOSCOPY N/A 09/28/2017   Procedure: COLONOSCOPY;  Surgeon: Toledo, Benay Pike, MD;  Location: [facility] ENDOSCOPY;  Service: Gastroenterology;  Laterality: N/A;   COLONOSCOPY WITH PROPOFOL N/A 08/21/2018    Procedure: COLONOSCOPY WITH PROPOFOL;  Surgeon: Lin Landsman, MD;  Location: [facility] ENDOSCOPY;  Service: Gastroenterology;  Laterality: N/A;   ENTEROSCOPY Left 11/06/2017   Procedure: ENTEROSCOPY;  Surgeon: Jonathon Bellows, MD;  Location: [facility] ENDOSCOPY;  Service: Gastroenterology;  Laterality: Left;   ENTEROSCOPY N/A 06/22/2018   Procedure: ENTEROSCOPY;  Surgeon: Lin Landsman, MD;  Location: [facility] ENDOSCOPY;  Service: Gastroenterology;  Laterality: N/A;   ENTEROSCOPY N/A 08/18/2018   Procedure: ENTEROSCOPY;  Surgeon: Jonathon Bellows, MD;  Location: [facility] ENDOSCOPY;  Service: Gastroenterology;  Laterality: N/A;   ENTEROSCOPY N/A 11/01/2018   Procedure: ENTEROSCOPY;  Surgeon: Virgel Manifold, MD;  Location: [facility] ENDOSCOPY;  Service: Endoscopy;  Laterality: N/A;   ESOPHAGOGASTRODUODENOSCOPY N/A 12/19/2016   Procedure: ESOPHAGOGASTRODUODENOSCOPY (EGD);  Surgeon: Lin Landsman, MD;  Location: [facility] ENDOSCOPY;  Service: Gastroenterology;  Laterality: N/A;   ESOPHAGOGASTRODUODENOSCOPY N/A 09/28/2017   Procedure: ESOPHAGOGASTRODUODENOSCOPY (EGD);  Surgeon: Toledo, Benay Pike, MD;  Location: [facility] ENDOSCOPY;  Service: Gastroenterology;  Laterality: N/A;   ESOPHAGOGASTRODUODENOSCOPY (EGD) WITH PROPOFOL N/A 08/08/2017   Procedure: ESOPHAGOGASTRODUODENOSCOPY (EGD) WITH PROPOFOL;  Surgeon: [name], MD;  Location: [facility] ENDOSCOPY;  Service: Endoscopy;  Laterality: N/A;   GIVENS CAPSULE STUDY N/A 10/28/2018   Procedure: GIVENS CAPSULE STUDY;  Surgeon: Jonathon Bellows, MD;  Location: [facility] ENDOSCOPY;  Service: Gastroenterology;  Laterality: N/A;   heart [place] in [place]     LEFT HEART CATH AND CORONARY ANGIOGRAPHY N/A 12/23/2016   Procedure: LEFT HEART CATH AND CORONARY ANGIOGRAPHY;  Surgeon: Teodoro Spray, MD;  Location: [place] CV LAB;  Service: Cardiovascular;  Laterality: N/A;   LOWER EXTREMITY ANGIOGRAPHY Left 06/26/2018   Procedure: Lower Extremity Angiography;  Surgeon:  Katha Cabal, MD;  Location: [place] CV LAB;  Service: Cardiovascular;  Laterality: Left;   TEE WITHOUT CARDIOVERSION N/A 10/29/2018   Procedure: TRANSESOPHAGEAL ECHOCARDIOGRAM (TEE);  Surgeon: Teodoro Spray, MD;  Location: [facility] ORS;  Service: Cardiovascular;  Laterality: N/A;     OB History   No obstetric history on file.      Home Medications    Prior to Admission medications   Medication Sig Start Date End Date Taking? Authorizing Provider  atorvastatin (LIPITOR) 40 MG tablet Take 40 mg by mouth daily.    [provider]  carvedilol (COREG) 3.125 MG tablet Take 1 tablet (3.125 mg total) by mouth 2 (two) times daily with a meal. 08/21/18   Stark Jock, Jude, MD  ferrous sulfate 325 (65 FE) MG tablet Take 1 tablet (325 mg total) by mouth 2 (two) times daily with a meal. 08/21/18   Ojie, Jude, MD  fluticasone (FLONASE) 50 MCG/ACT nasal spray Place 2 sprays into both nostrils daily.    [provider]  folic acid (FOLVITE) 1 MG tablet Take 1 tablet (1 mg total) by mouth daily. 08/21/18   Stark Jock Jude, MD  furosemide (LASIX) 40 MG tablet Take 1 tablet (40 mg total) by mouth 2 (two) times daily. 11/06/18   Dustin Flock, MD  Insulin Glargine (LANTUS SOLOSTAR) 100 UNIT/ML Solostar Pen Inject 10 Units into the skin daily. 11/05/18   Dustin Flock, MD  Multiple Vitamin (MULTIVITAMIN WITH MINERALS) TABS tablet Take 1 tablet by mouth daily. Patient not taking: Reported on 10/25/2018 06/28/18   Lang Snow, NP  pantoprazole (PROTONIX) 40 MG  tablet Take 40 mg by mouth 2 (two) times daily.    [provider]  pregabalin (LYRICA) 150 MG capsule Take 150 mg by mouth 2 (two) times daily. 08/03/18   [provider]  PROAIR HFA 108 (90 Base) MCG/ACT inhaler Inhale 1-2 puffs into the lungs every 4 (four) hours as needed. 10/10/17   [provider]  spironolactone (ALDACTONE) 25 MG tablet Take 12.5 mg by mouth daily.    [provider]    SYMBICORT 160-4.5 MCG/ACT inhaler Inhale 2 puffs into the lungs 2 (two) times daily. 10/10/17   [provider]  torsemide (DEMADEX) 20 MG tablet Take 20 mg by mouth daily. Take 2 tablets if weight is greater than 185 lbs. NO tablets if weight is less than 177 lbs.    [provider]    Family History Family History  Problem Relation Age of Onset   Hypertension Mother    Diabetes Mellitus II Mother    Breast cancer Mother    Heart disease Father    Cancer Sister     Social History Social History   Tobacco Use   Smoking status: Current Some Day Smoker    Packs/day: 0.25    Types: Cigarettes   Smokeless tobacco: Never Used  Substance Use Topics   Alcohol use: No   Drug use: No     Allergies   Latex and Gabapentin   Review of Systems Review of Systems Level 5 caveat because of confusion.  Physical Exam Updated Vital Signs BP (!) 142/78    Pulse 72    Temp 97.8 F (36.6 C) (Oral)    Resp 11    SpO2 98%   Physical Exam Vitals signs and nursing note reviewed.  Constitutional:      Appearance: She is well-developed. She is obese.     Comments: Laying in bed with eyes open but appears drowsy.  HENT:     Head: Normocephalic and atraumatic.  Eyes:     General:        Right eye: No discharge.        Left eye: No discharge.     Conjunctiva/sclera: Conjunctivae normal.  Neck:     Musculoskeletal: Neck supple.  Cardiovascular:     Rate and Rhythm: Normal rate and regular rhythm.     Heart sounds: Normal heart sounds. No murmur. No friction rub. No gallop.   Pulmonary:     Effort: Pulmonary effort is normal. No respiratory distress.     Breath sounds: Normal breath sounds.  Abdominal:     General: There is no distension.     Palpations: Abdomen is soft.     Tenderness: There is no abdominal tenderness.     Comments: Obese abdomen.  Small reducible ventral hernia.  Does not seem tender.  Genitourinary:    Comments: Maroon stool noted.   Strongly heme positive. Musculoskeletal:        General: No tenderness.  Skin:    General: Skin is warm and dry.  Neurological:     Comments: Eyes open.  Will make eye contact.  Not following commands though.  Occasionally saying "yes" or "no".  No facial droop appreciated.  Withdrawals extremities to noxious stimuli.      ED Treatments / Results  Labs (all labs ordered are listed, but only abnormal results are displayed) Labs Reviewed  CBC WITH DIFFERENTIAL/PLATELET - Abnormal; Notable for the following components:      Result Value   RBC 2.41 (*)  Hemoglobin 6.3 (*)    HCT 23.0 (*)    MCHC 27.4 (*)    RDW 28.2 (*)    Platelets 120 (*)    nRBC 1 (*)    All other components within normal limits  COMPREHENSIVE METABOLIC PANEL - Abnormal; Notable for the following components:   Sodium 147 (*)    Glucose, Bld 146 (*)    BUN 60 (*)    Creatinine, Ser 2.72 (*)    Calcium 8.8 (*)    Albumin 2.7 (*)    Alkaline Phosphatase 140 (*)    Total Bilirubin 1.3 (*)    GFR calc non Af Amer 18 (*)    GFR calc Af Amer 21 (*)    All other components within normal limits  COMPREHENSIVE METABOLIC PANEL - Abnormal; Notable for the following components:   Sodium 148 (*)    Chloride 114 (*)    Glucose, Bld 142 (*)    BUN 62 (*)    Creatinine, Ser 2.79 (*)    Albumin 2.5 (*)    Alkaline Phosphatase 128 (*)    Total Bilirubin 1.5 (*)    GFR calc non Af Amer 17 (*)    GFR calc Af Amer 20 (*)    All other components within normal limits  PROTIME-INR - Abnormal; Notable for the following components:   Prothrombin Time 17.7 (*)    INR 1.5 (*)    All other components within normal limits  CBC - Abnormal; Notable for the following components:   RBC 3.00 (*)    Hemoglobin 8.1 (*)    HCT 28.0 (*)    MCHC 28.9 (*)    RDW 23.6 (*)    Platelets 119 (*)    All other components within normal limits  CBC - Abnormal; Notable for the following components:   RBC 2.83 (*)    Hemoglobin 7.9 (*)      HCT 25.9 (*)    RDW 23.8 (*)    Platelets 108 (*)    All other components within normal limits  CBC - Abnormal; Notable for the following components:   RBC 2.81 (*)    Hemoglobin 7.7 (*)    HCT 26.0 (*)    MCHC 29.6 (*)    RDW 23.9 (*)    Platelets 111 (*)    All other components within normal limits  GLUCOSE, CAPILLARY - Abnormal; Notable for the following components:   Glucose-Capillary 128 (*)    All other components within normal limits  CBC - Abnormal; Notable for the following components:   RBC 2.92 (*)    Hemoglobin 7.9 (*)    HCT 27.0 (*)    MCHC 29.3 (*)    RDW 24.6 (*)    Platelets 113 (*)    All other components within normal limits  CBC - Abnormal; Notable for the following components:   RBC 2.95 (*)    Hemoglobin 8.0 (*)    HCT 27.4 (*)    MCHC 29.2 (*)    RDW 24.5 (*)    Platelets 103 (*)    All other components within normal limits  CBC - Abnormal; Notable for the following components:   RBC 2.72 (*)    Hemoglobin 7.3 (*)    HCT 25.3 (*)    MCHC 28.9 (*)    RDW 24.4 (*)    Platelets 103 (*)    All other components within normal limits  GLUCOSE, CAPILLARY - Abnormal; Notable for the following components:  Glucose-Capillary 136 (*)    All other components within normal limits  GLUCOSE, CAPILLARY - Abnormal; Notable for the following components:   Glucose-Capillary 142 (*)    All other components within normal limits  AMMONIA - Abnormal; Notable for the following components:   Ammonia 108 (*)    All other components within normal limits  GLUCOSE, CAPILLARY - Abnormal; Notable for the following components:   Glucose-Capillary 154 (*)    All other components within normal limits  BASIC METABOLIC PANEL - Abnormal; Notable for the following components:   Sodium 148 (*)    Chloride 115 (*)    Glucose, Bld 141 (*)    BUN 57 (*)    Creatinine, Ser 2.60 (*)    Calcium 8.7 (*)    GFR calc non Af Amer 19 (*)    GFR calc Af Amer 22 (*)    All other  components within normal limits  CBC - Abnormal; Notable for the following components:   RBC 3.00 (*)    Hemoglobin 8.1 (*)    HCT 27.2 (*)    MCHC 29.8 (*)    RDW 24.3 (*)    Platelets 113 (*)    All other components within normal limits  GLUCOSE, CAPILLARY - Abnormal; Notable for the following components:   Glucose-Capillary 146 (*)    All other components within normal limits  GLUCOSE, CAPILLARY - Abnormal; Notable for the following components:   Glucose-Capillary 139 (*)    All other components within normal limits  HEPATIC FUNCTION PANEL - Abnormal; Notable for the following components:   Albumin 2.5 (*)    Total Bilirubin 1.6 (*)    Bilirubin, Direct 0.5 (*)    Indirect Bilirubin 1.1 (*)    All other components within normal limits  CBC - Abnormal; Notable for the following components:   RBC 2.65 (*)    Hemoglobin 7.3 (*)    HCT 24.3 (*)    RDW 24.5 (*)    Platelets 103 (*)    All other components within normal limits  GLUCOSE, CAPILLARY - Abnormal; Notable for the following components:   Glucose-Capillary 126 (*)    All other components within normal limits  CBG MONITORING, ED - Abnormal; Notable for the following components:   Glucose-Capillary 131 (*)    All other components within normal limits  POC OCCULT BLOOD, ED - Abnormal; Notable for the following components:   Fecal Occult Bld POSITIVE (*)    All other components within normal limits  CBG MONITORING, ED - Abnormal; Notable for the following components:   Glucose-Capillary 139 (*)    All other components within normal limits  CULTURE, BLOOD (ROUTINE X 2)  CULTURE, BLOOD (ROUTINE X 2)  SARS CORONAVIRUS 2 (TAT 6-24 HRS)  URINE CULTURE  MRSA PCR SCREENING  URINALYSIS, ROUTINE W REFLEX MICROSCOPIC  PROCALCITONIN  RAPID URINE DRUG SCREEN, HOSP PERFORMED  TYPE AND SCREEN  ABO/RH  PREPARE RBC (CROSSMATCH)    EKG EKG Interpretation  Date/Time:  Tuesday November 20 2018 11:57:03 EST Ventricular Rate:    72 PR Interval:    QRS Duration: 144 QT Interval:  447 QTC Calculation: 490 R Axis:   -144 Text Interpretation: Sinus rhythm Probable left atrial enlargement IVCD, consider atypical RBBB Abnormal T, consider ischemia, lateral leads Confirmed by Virgel Manifold [phone]) on 11/20/2018 12:04:27 PM   Radiology Ct Head Wo Contrast  Result Date: 11/20/2018 CLINICAL DATA:  Altered mental status EXAM: CT HEAD WITHOUT CONTRAST TECHNIQUE: Contiguous axial  images were obtained from the base of the skull through the vertex without intravenous contrast. COMPARISON:  August 18, 2018 FINDINGS: Brain: Mild diffuse atrophy is stable. There is no intracranial mass, hemorrhage, extra-axial fluid collection, or midline shift. There is patchy small vessel disease throughout the centra semiovale bilaterally. There is a prior small lacunar infarct in the posterosuperior right centrum semiovale, stable. There is evidence of prior infarcts in the anterior limbs of the right internal and external capsule. There is also evidence of a prior infarct in the anterior limb and genu of the left internal capsule. No acute infarct is demonstrable on this study. Vascular: No hyperdense vessel. There is calcification in the carotid siphon regions bilaterally. Skull: The bony calvarium appears intact. Sinuses/Orbits: There is mucosal thickening in several ethmoid air cells. Other paranasal sinuses are clear. Orbits appear symmetric bilaterally. Other: Mastoids are hypoplastic but clear. IMPRESSION: Mild atrophy with patchy supratentorial small vessel disease and stable prior small scattered infarcts. No acute infarct evident. No mass or hemorrhage. Foci of arterial vascular calcification noted. Mucosal thickening noted in several ethmoid air cells. Electronically Signed   By: Lowella Grip III M.D.   On: 11/20/2018 14:01    Procedures Procedures (including critical care time)  CRITICAL CARE Performed by: Virgel Manifold Total critical  care time: 35 minutes Critical care time was exclusive of separately billable procedures and treating other patients. Critical care was necessary to treat or prevent imminent or life-threatening deterioration. Critical care was time spent personally by me on the following activities: development of treatment plan with patient and/or surrogate as well as nursing, discussions with consultants, evaluation of patient's response to treatment, examination of patient, obtaining history from patient or surrogate, ordering and performing treatments and interventions, ordering and review of laboratory studies, ordering and review of radiographic studies, pulse oximetry and re-evaluation of patient's condition.   Medications Ordered in ED Medications  ondansetron (ZOFRAN) injection 4 mg (4 mg Intravenous Given 11/20/18 1403)     Initial Impression / Assessment and Plan / ED Course  I have reviewed the triage vital signs and the nursing notes.  Pertinent labs & imaging results that were available during my care of the patient were reviewed by me and considered in my medical decision making (see chart for details).    63 year old female with drowsiness. Acute on chronic anemia. History of stomach and small bowel AVMs. Prior endoscopies reviewed. Appears that her prior GI care is through [facility]. Will consult GI physician here. PPI and transfusion. Medicine admission. Previous mission with strep bacteremia. Afebrile. No leukocytosis. Hemodynamically okay. Blood cultures obtained but antibiotics deferred at this time.  4:48 PM Discussed with Tye Savoy, [facility].   Final Clinical Impressions(s) / ED Diagnoses   Final diagnoses:  Symptomatic anemia  Altered mental status  GI Bleed.   ED Discharge Orders    None       Virgel Manifold, MD 11/22/18 [phone]

## 2018-11-20 NOTE — ED Notes (Signed)
Lab called this RN for hemoglobin of 6.3

## 2018-11-20 NOTE — H&P (Signed)
NAME:  [name], MRN:  [id number], DOB:  20-Jun-1955, LOS: 0 ADMISSION DATE:  11/20/2018, Primary: [place]  CHIEF COMPLAINT:  AMS  Medical Service: Internal Medicine Teaching Service         Attending Physician: Dr. Lucious Groves, DO    First Contact: Dr. Darrick Meigs Pager: [phone]  Second Contact: Dr. Koleen Distance Pager: [phone]       After Hours (After 5p/  First Contact Pager: [phone]  weekends / holidays): Second Contact Pager: [phone]    History of present illness   63 yo female with PMH of small bowel AVMs, COPD, HFrEF, DM 2, chronic hepatitis C, HTN, HLD, and sleep apnea presented to ED for GI bleeding and altered mental status. Unable to obtain history from patient due to encephalopathic state. Further history obtained from chart review.  Patient transferred from nursing facility for change in mental status occurring one day prior to admission.  VSS on admission. No leukocytosis on labs. GI consulted in ED. One unit PRBCs transfused for hgb 6.3.   Past Medical History  She,  has a past medical history of Cervical cancer ([place]), CHF (congestive heart failure) ([place]), Chronic anemia, COPD (chronic obstructive pulmonary disease) ([place]), Diabetes mellitus without complication ([place]), Gastric ulcer, NSTEMI (non-ST elevated myocardial infarction) ([place]), and Upper GI bleed.   Home Medications     Prior to Admission medications   Medication Sig Start Date End Date Taking? Authorizing Provider  atorvastatin (LIPITOR) 40 MG tablet Take 40 mg by mouth daily.   Yes [provider]  carvedilol (COREG) 3.125 MG tablet Take 1 tablet (3.125 mg total) by mouth 2 (two) times daily with a meal. 08/21/18  Yes Ojie, Jude, MD  fluticasone (FLONASE) 50 MCG/ACT nasal spray Place 2 sprays into both nostrils daily.   Yes [provider]  folic acid (FOLVITE) 1 MG tablet Take 1 tablet (1 mg total) by mouth daily. 08/21/18  Yes Ojie, Jude, MD  Insulin Glargine (LANTUS  SOLOSTAR) 100 UNIT/ML Solostar Pen Inject 10 Units into the skin daily. 11/05/18  Yes Dustin Flock, MD  Multiple Vitamin (MULTIVITAMIN WITH MINERALS) TABS tablet Take 1 tablet by mouth daily. 06/28/18  Yes Lang Snow, NP  spironolactone (ALDACTONE) 25 MG tablet Take 12.5 mg by mouth daily.   Yes [provider]  torsemide (DEMADEX) 20 MG tablet Take 20 mg by mouth daily. Take 2 tablets if weight is greater than 185 lbs. NO tablets if weight is less than 177 lbs.   Yes [provider]  ferrous sulfate 325 (65 FE) MG tablet Take 1 tablet (325 mg total) by mouth 2 (two) times daily with a meal. Patient not taking: Reported on 11/20/2018 08/21/18   Otila Back, MD  furosemide (LASIX) 40 MG tablet Take 1 tablet (40 mg total) by mouth 2 (two) times daily. Patient not taking: Reported on 11/20/2018 11/06/18   Dustin Flock, MD    Allergies    Allergies as of 11/20/2018 - Review Complete 11/20/2018  Allergen Reaction Noted  . Latex Anaphylaxis and Rash 11/07/2012  . Gabapentin Nausea And Vomiting and Other (See Comments) 12/20/2012    Social History   reports that she has been smoking cigarettes. She has been smoking about 0.25 packs per day. She has never used smokeless tobacco. She reports that she does not drink alcohol or use drugs.   Family History   Her family history includes Breast cancer in her mother; Cancer in her sister; Diabetes Mellitus II  in her mother; Heart disease in her father; Hypertension in her mother.    ROS  Unable to be obtained due to encephalopathic nature  Objective   Blood pressure (!) 142/95, pulse 72, temperature 97.6 F (36.4 C), temperature source Axillary, resp. rate 14, SpO2 99 %.     Intake/Output Summary (Last 24 hours) at 11/20/2018 1904 Last data filed at 11/20/2018 1855 Gross per 24 hour  Intake 100 ml  Output -  Net 100 ml   There were no vitals filed for this visit.  Examination: GENERAL: chronically ill  appearing female HEENT: head atraumatic. No conjunctival injection. Nares patent.  CARDIAC: heart RRR. +2 peripheral edema. LE cool to touch. Small ulcers on lower extremities. PULMONARY: acyanotic. Lung sounds clear to auscultation. ABDOMEN: soft. Nontender to palpation.  Mildly distended NEURO: not alert or oriented. Laying in bed groaning. Opens eyes to voice but does not respond. Does not follow commands. No apparent facial asymmetry.  SKIN: several ulcers and skin changes consistent with venous stasis of the lower extremities. Apparent pressure ulcer on the medial aspect of the sole of the right foot.  PSYCH: encephalopathic   Consults:  GI  Significant Diagnostic Tests:  EKG: personally reviewed my interpretation is sinus rhythm  CXR: personally reviewed my interpretation is unremarkable  CT head: no acute intracranial abnormalities. Mild atrophy with patchy small vessel disease and prior small scattered infarcts.   Labs    CBC Latest Ref Rng & Units 11/20/2018 11/03/2018 11/02/2018  WBC 4.0 - 10.5 K/uL 4.7 8.1 6.9  Hemoglobin 12.0 - 15.0 g/dL 6.3(LL) 8.5(L) 8.2(L)  Hematocrit 36.0 - 46.0 % 23.0(L) 29.1(L) 28.4(L)  Platelets 150 - 400 K/uL 120(L) 161 162   BMP Latest Ref Rng & Units 11/20/2018 11/06/2018 11/04/2018  Glucose 70 - 99 mg/dL 146(H) 146(H) 131(H)  BUN 8 - 23 mg/dL 60(H) 40(H) 41(H)  Creatinine 0.44 - 1.00 mg/dL 2.72(H) 1.92(H) 2.21(H)  Sodium 135 - 145 mmol/L 147(H) 139 139  Potassium 3.5 - 5.1 mmol/L 4.7 3.8 4.0  Chloride 98 - 111 mmol/L 111 103 106  CO2 22 - 32 mmol/L 24 27 25   Calcium 8.9 - 10.3 mg/dL 8.8(L) 7.7(L) 8.2(L)   Hgb 8.5 on labs 2 weeks prior Stool occult UA unremarkable  Summary  63 yo female with PMH of small bowel AVMs, COPD, HFrEF, DM 2, chronic hepatitis C, HTN, HLD, and sleep apnea presented to ED for GI bleeding and altered mental status.   Assessment & Plan:  Active Problems:   Acute on chronic blood loss anemia  GI bleed. + stool  occult in ED. Hgb 6.3 on admission. Transfusing 1U PRBCs. Hx of gastric and small bowel AVMs. Recent capsule endoscopy suggested AVM. Small bowel endoscopy was attempted however was unable to find bleed due to patient decompensating during exam.  VSS. Iron deficiency anemia.  Plan: GI consulted. 2h post transfusion hgb. LR @ 66m/hr. Repeat cbc in am. Fereheme infusion.  Altered mental status. Chart review suggests some cognitive impairment at baseline. Unlikely to be infection given lack of leukocytosis however blood cultures were obtained. UA unremarkable. No significant electrolyte derangements however Na is elevated at 147--likely related to dehydration. CT head neg for acute process on admission. Does not appear that she is on any centrally acting medications. Likely related to ongoing bleed and anemia. Obtain procalc. Will continue to monitor.   HFrEF. TEE in October significant for EF of 50-55%. Normal systolic function. Moderate dilation of LA, RA. Moderate MVR and stenosis.  Moderate TVR. On torsemide 59m at home. Plan: tele monitor. Daily weights.  Strict I/O. Gentle IVF.  Acute on CKD. Likely related to hypoperfusion and acute GI bleed. Plan: gentle IVF. Will continue to monitor.  HTN: stable on admission. Continue coreg.  DM II: SSI Best practice:  CODE STATUS:Full Diet: NPO GI prophylaxis: protonix 437mBID DVT for prophylaxis: hold for active bleeding. SCDs Dispo: Admit patient to Inpatient with expected length of stay greater than 2 midnights.   RYMitzi HansenMD INTERNAL MEDICINE RESIDENT PGY-1 11/20/18 7:04 PM

## 2018-11-20 NOTE — ED Notes (Signed)
Attempted report 

## 2018-11-20 NOTE — ED Notes (Signed)
Pt returned from CT, per CT tech pt had an episode of blood tinged vomiting and needed suctioning.

## 2018-11-20 NOTE — ED Notes (Signed)
Iv team at bedside  

## 2018-11-20 NOTE — ED Triage Notes (Signed)
Pt BIB EMS from [place] was yesterday at 0700, but EMS was called this morning for blood in stool. EMS reports pt has R side facial droop and weakness, pt has said "yes" or "no" but otherwise mute. VSS

## 2018-11-21 ENCOUNTER — Inpatient Hospital Stay (HOSPITAL_COMMUNITY): Payer: Medicaid Other

## 2018-11-21 ENCOUNTER — Encounter (HOSPITAL_COMMUNITY): Payer: Self-pay | Admitting: Certified Registered Nurse Anesthetist

## 2018-11-21 ENCOUNTER — Encounter (HOSPITAL_COMMUNITY)
Admission: EM | Disposition: A | Discharge status: Skilled Nursing Facility | Payer: Self-pay | Source: Skilled Nursing Facility | Attending: Internal Medicine

## 2018-11-21 DIAGNOSIS — H269 Unspecified cataract: Secondary | ICD-10-CM

## 2018-11-21 DIAGNOSIS — I081 Rheumatic disorders of both mitral and tricuspid valves: Secondary | ICD-10-CM

## 2018-11-21 DIAGNOSIS — G9340 Encephalopathy, unspecified: Secondary | ICD-10-CM

## 2018-11-21 DIAGNOSIS — K31819 Angiodysplasia of stomach and duodenum without bleeding: Secondary | ICD-10-CM

## 2018-11-21 DIAGNOSIS — D62 Acute posthemorrhagic anemia: Secondary | ICD-10-CM

## 2018-11-21 LAB — CBC
HCT: 28 % — ABNORMAL LOW (ref 36.0–46.0)
HCT: 25.9 % — ABNORMAL LOW (ref 36.0–46.0)
HCT: 26 % — ABNORMAL LOW (ref 36.0–46.0)
HCT: 27 % — ABNORMAL LOW (ref 36.0–46.0)
HCT: 27.4 % — ABNORMAL LOW (ref 36.0–46.0)
HCT: 27.2 % — ABNORMAL LOW (ref 36.0–46.0)
HCT: 25.3 % — ABNORMAL LOW (ref 36.0–46.0)
Hemoglobin: 8.1 g/dL — ABNORMAL LOW (ref 12.0–15.0)
Hemoglobin: 7.9 g/dL — ABNORMAL LOW (ref 12.0–15.0)
Hemoglobin: 7.7 g/dL — ABNORMAL LOW (ref 12.0–15.0)
Hemoglobin: 7.9 g/dL — ABNORMAL LOW (ref 12.0–15.0)
Hemoglobin: 8 g/dL — ABNORMAL LOW (ref 12.0–15.0)
Hemoglobin: 8.1 g/dL — ABNORMAL LOW (ref 12.0–15.0)
Hemoglobin: 7.3 g/dL — ABNORMAL LOW (ref 12.0–15.0)
MCH: 27 pg (ref 26.0–34.0)
MCH: 27.9 pg (ref 26.0–34.0)
MCH: 27.4 pg (ref 26.0–34.0)
MCH: 27.1 pg (ref 26.0–34.0)
MCH: 27.1 pg (ref 26.0–34.0)
MCH: 27 pg (ref 26.0–34.0)
MCH: 26.8 pg (ref 26.0–34.0)
MCHC: 28.9 g/dL — ABNORMAL LOW (ref 30.0–36.0)
MCHC: 30.5 g/dL (ref 30.0–36.0)
MCHC: 29.6 g/dL — ABNORMAL LOW (ref 30.0–36.0)
MCHC: 29.3 g/dL — ABNORMAL LOW (ref 30.0–36.0)
MCHC: 29.2 g/dL — ABNORMAL LOW (ref 30.0–36.0)
MCHC: 29.8 g/dL — ABNORMAL LOW (ref 30.0–36.0)
MCHC: 28.9 g/dL — ABNORMAL LOW (ref 30.0–36.0)
MCV: 93.3 fL (ref 80.0–100.0)
MCV: 91.5 fL (ref 80.0–100.0)
MCV: 92.5 fL (ref 80.0–100.0)
MCV: 92.5 fL (ref 80.0–100.0)
MCV: 92.9 fL (ref 80.0–100.0)
MCV: 90.7 fL (ref 80.0–100.0)
MCV: 93 fL (ref 80.0–100.0)
Platelets: 119 10*3/uL — ABNORMAL LOW (ref 150–400)
Platelets: 108 10*3/uL — ABNORMAL LOW (ref 150–400)
Platelets: 111 10*3/uL — ABNORMAL LOW (ref 150–400)
Platelets: 113 10*3/uL — ABNORMAL LOW (ref 150–400)
Platelets: 103 10*3/uL — ABNORMAL LOW (ref 150–400)
Platelets: 113 10*3/uL — ABNORMAL LOW (ref 150–400)
Platelets: 103 10*3/uL — ABNORMAL LOW (ref 150–400)
RBC: 3 MIL/uL — ABNORMAL LOW (ref 3.87–5.11)
RBC: 2.83 MIL/uL — ABNORMAL LOW (ref 3.87–5.11)
RBC: 2.81 MIL/uL — ABNORMAL LOW (ref 3.87–5.11)
RBC: 2.92 MIL/uL — ABNORMAL LOW (ref 3.87–5.11)
RBC: 2.95 MIL/uL — ABNORMAL LOW (ref 3.87–5.11)
RBC: 3 MIL/uL — ABNORMAL LOW (ref 3.87–5.11)
RBC: 2.72 MIL/uL — ABNORMAL LOW (ref 3.87–5.11)
RDW: 23.6 % — ABNORMAL HIGH (ref 11.5–15.5)
RDW: 23.8 % — ABNORMAL HIGH (ref 11.5–15.5)
RDW: 23.9 % — ABNORMAL HIGH (ref 11.5–15.5)
RDW: 24.6 % — ABNORMAL HIGH (ref 11.5–15.5)
RDW: 24.5 % — ABNORMAL HIGH (ref 11.5–15.5)
RDW: 24.3 % — ABNORMAL HIGH (ref 11.5–15.5)
RDW: 24.4 % — ABNORMAL HIGH (ref 11.5–15.5)
WBC: 5.5 10*3/uL (ref 4.0–10.5)
WBC: 5.3 10*3/uL (ref 4.0–10.5)
WBC: 5.2 10*3/uL (ref 4.0–10.5)
WBC: 6 10*3/uL (ref 4.0–10.5)
WBC: 5.6 10*3/uL (ref 4.0–10.5)
WBC: 6.5 10*3/uL (ref 4.0–10.5)
WBC: 6 10*3/uL (ref 4.0–10.5)
nRBC: 0 % (ref 0.0–0.2)
nRBC: 0 % (ref 0.0–0.2)
nRBC: 0 % (ref 0.0–0.2)
nRBC: 0 % (ref 0.0–0.2)
nRBC: 0 % (ref 0.0–0.2)
nRBC: 0 % (ref 0.0–0.2)
nRBC: 0 % (ref 0.0–0.2)

## 2018-11-21 LAB — COMPREHENSIVE METABOLIC PANEL
ALT: 10 U/L (ref 0–44)
AST: 20 U/L (ref 15–41)
Albumin: 2.5 g/dL — ABNORMAL LOW (ref 3.5–5.0)
Alkaline Phosphatase: 128 U/L — ABNORMAL HIGH (ref 38–126)
Anion gap: 12 (ref 5–15)
BUN: 62 mg/dL — ABNORMAL HIGH (ref 8–23)
CO2: 22 mmol/L (ref 22–32)
Calcium: 9 mg/dL (ref 8.9–10.3)
Chloride: 114 mmol/L — ABNORMAL HIGH (ref 98–111)
Creatinine, Ser: 2.79 mg/dL — ABNORMAL HIGH (ref 0.44–1.00)
GFR calc Af Amer: 20 mL/min — ABNORMAL LOW (ref >60)
GFR calc non Af Amer: 17 mL/min — ABNORMAL LOW (ref >60)
Glucose, Bld: 142 mg/dL — ABNORMAL HIGH (ref 70–99)
Potassium: 4.4 mmol/L (ref 3.5–5.1)
Sodium: 148 mmol/L — ABNORMAL HIGH (ref 135–145)
Total Bilirubin: 1.5 mg/dL — ABNORMAL HIGH (ref 0.3–1.2)
Total Protein: 7.9 g/dL (ref 6.5–8.1)

## 2018-11-21 LAB — GLUCOSE, CAPILLARY
Glucose-Capillary: 128 mg/dL — ABNORMAL HIGH (ref 70–99)
Glucose-Capillary: 136 mg/dL — ABNORMAL HIGH (ref 70–99)
Glucose-Capillary: 139 mg/dL — ABNORMAL HIGH (ref 70–99)
Glucose-Capillary: 142 mg/dL — ABNORMAL HIGH (ref 70–99)
Glucose-Capillary: 146 mg/dL — ABNORMAL HIGH (ref 70–99)
Glucose-Capillary: 154 mg/dL — ABNORMAL HIGH (ref 70–99)

## 2018-11-21 LAB — AMMONIA: Ammonia: 108 umol/L — ABNORMAL HIGH (ref 9–35)

## 2018-11-21 LAB — MRSA PCR SCREENING: MRSA by PCR: NEGATIVE

## 2018-11-21 LAB — URINE CULTURE: Culture: NO GROWTH

## 2018-11-21 LAB — PROTIME-INR
INR: 1.5 — ABNORMAL HIGH (ref 0.8–1.2)
Prothrombin Time: 17.7 seconds — ABNORMAL HIGH (ref 11.4–15.2)

## 2018-11-21 SURGERY — ENTEROSCOPY
Anesthesia: Monitor Anesthesia Care

## 2018-11-21 MED ORDER — DEXTROSE 5 % IV SOLN
INTRAVENOUS | Status: AC
  Administered 2018-11-21: 08:00:00 via INTRAVENOUS

## 2018-11-21 MED ORDER — SODIUM CHLORIDE 0.9 % IV SOLN
8.0000 mg/h | INTRAVENOUS | Status: DC
  Administered 2018-11-21 – 2018-11-22 (×3): 8 mg/h via INTRAVENOUS
  Filled 2018-11-21 (×5): qty 80

## 2018-11-21 MED ORDER — LACTULOSE ENEMA
300.0000 mL | Freq: Once | ORAL | Status: AC
  Administered 2018-11-21: 300 mL via RECTAL
  Filled 2018-11-21: qty 300

## 2018-11-21 NOTE — Consult Note (Addendum)
Referring Provider:   Internal Medicine Teaching Service    Primary Care Physician:  [facility] Primary Gastroenterologist:   Dr. Marius Ditch          Reason for Consultation:   GI bleed                ASSESSMENT /  PLAN    1. 63 yo female with chronic IDA / intestinal AMVs / PUD admitted with acute on chronic anemia and GI bleed ( maroon stool). She has had numerous upper and lower endoscopies by her GI in [place]. In fact she just had another small bowel endoscopy earlier this month but study aborted due to respiratory issues. She has passed some black stool today.  -hgb improved after 2 u of blood - 6.3 >>7.9.   -Continue to monitor H+H and transfuse as necessary -Getting BID PPI, changing it to gtt .  -I will call husband to get consent for small bowel endoscopy which we will perform tomorrow.    2. ?Cirrhosis. Suggested on non-contrast CT scan in June 2020. Subsequent CT scans haven't mentioned cirrhosis. She does have thrombocytopenia, coagulopathy. No findings of portal HTN on upper endoscopies though. She is followed outpatient by GI in [facility].   3. Altered mental status. Etiology unclear. MRI brain negative for acute findings. I spoke with Teaching Service who spoke with Nursing Home, apparently patient is normally alert and oriented. Workup in progress. For whatever it's worth I am checking an ammonia level   HPI:     RENDA POHLMAN is a 63 y.o. female with pmh significant for CPOD, CHF, DM, HTN, HCV, cirrhosis, chronic IDA, intestinal AVMs and PUD.She has had multiple admissions to [facility] for symptomatic anemia / obscure GI bleeding. Admitted to  [facility] 10/26/18 with sepsis, symptomatic anemia, AKI on CKD. ,Hgb had declined from mid 7 to 5.4. She required 4 units of PRBC. Small bowel endoscopy that admission was aborted due to respiratory status but there didn't appear to be any active bleeding. Hgb stabilized at 8.5, she was discharged on 10/21.   Patient  brought from [facility] to ED yesterday for evaluation of blood in stool and right facial droop / altered mental status / AKI on CKD / acute on chronic anemia.  Hgb  ED reported maroon stool on exam (I spoke with them on phone). Hgb was 6.3 <<< 8.5. VSS on admission. Transfused 2 unit of bloods, hgb 7.9 today. On iron at home, PPI not on home med list. Doesn't take NSAIDs.   Previous Endoscopic Evaluations (in 2020). Several additional upper and lower endoscopies prior to 2020.   11/01/18 Small bowel endoscopy The procedure was aborted due to the patient's respiratory instability (hypoxia). - No specimens collected. - No evidence of active or recent bleeding seen throughout the exam. - This exam was limited in identifying small lesions or AVM. However, the exam was sufficient to confirm the absence of any active bleedings in the esophagus, stomach, duodenum or jejunum. Impression: - Continue Serial CBCs and transfuse PRN - Due to the absence of any clinical signs of active GI bleeding and no active bleeding on endoscopy today, would not recommend a repeat exam at this time. Pt should follow up in GI clinic closely and can be reassessed at a later time, when she is better medically optimized, to consider upper endoscopy or enteroscopy to find and treat the non bleeding AVM seen on capsule study this admission.  10/30/18 VCE Red spots  in stomach, AVM in duodenum  08/21/18 Colonoscopy. Complete exam with adequate prep -normal colon  08/18/18 Small bowel endoscopy -Normal esophagus. - The examined portion of the jejunum was normal. - Erythematous mucosa in the greater curvature. Biopsied.  06/22/18 Small bowel endoscopy  The examined portion of the jejunum was normal. - Normal examined duodenum. - Oozing gastric ulcers with oozing hemorrhage ([place] Class Ib). Treated with bipolar cautery. - Normal gastroesophageal junction and esophagus. - No specimens collected.    Past Medical  History:  Diagnosis Date   Cervical cancer (HCC)    CHF (congestive heart failure) (HCC)    Chronic anemia    COPD (chronic obstructive pulmonary disease) (HCC)    Diabetes mellitus without complication (HCC)    Gastric ulcer    NSTEMI (non-ST elevated myocardial infarction) ([place])    Upper GI bleed     Past Surgical History:  Procedure Laterality Date   AMPUTATION Left 06/24/2018   Procedure: AMPUTATION GREAT TOE;  Surgeon: Samara Deist, DPM;  Location: [facility] ORS;  Service: Podiatry;  Laterality: Left;   APPENDECTOMY     CARDIAC CATHETERIZATION     CHOLECYSTECTOMY     COLONOSCOPY N/A 09/28/2017   Procedure: COLONOSCOPY;  Surgeon: Toledo, Benay Pike, MD;  Location: [facility] ENDOSCOPY;  Service: Gastroenterology;  Laterality: N/A;   COLONOSCOPY WITH PROPOFOL N/A 08/21/2018   Procedure: COLONOSCOPY WITH PROPOFOL;  Surgeon: Lin Landsman, MD;  Location[facility] ENDOSCOPY;  Service: Gastroenterology;  Laterality: N/A;   ENTEROSCOPY Left 11/06/2017   Procedure: ENTEROSCOPY;  Surgeon: Jonathon Bellows, MD;  Location: [facility] ENDOSCOPY;  Service: Gastroenterology;  Laterality: Left;   ENTEROSCOPY N/A 06/22/2018   Procedure: ENTEROSCOPY;  Surgeon: Lin Landsman, MD;  Location: [facility] ENDOSCOPY;  Service: Gastroenterology;  Laterality: N/A;   ENTEROSCOPY N/A 08/18/2018   Procedure: ENTEROSCOPY;  Surgeon: Jonathon Bellows, MD;  Location: [facility] ENDOSCOPY;  Service: Gastroenterology;  Laterality: N/A;   ENTEROSCOPY N/A 11/01/2018   Procedure: ENTEROSCOPY;  Surgeon: Virgel Manifold, MD;  Location: [facility] ENDOSCOPY;  Service: Endoscopy;  Laterality: N/A;   ESOPHAGOGASTRODUODENOSCOPY N/A 12/19/2016   Procedure: ESOPHAGOGASTRODUODENOSCOPY (EGD);  Surgeon: Lin Landsman, MD;  Location: [facility] ENDOSCOPY;  Service: Gastroenterology;  Laterality: N/A;   ESOPHAGOGASTRODUODENOSCOPY N/A 09/28/2017   Procedure: ESOPHAGOGASTRODUODENOSCOPY (EGD);  Surgeon: Toledo, Benay Pike, MD;  Location: [facility]  ENDOSCOPY;  Service: Gastroenterology;  Laterality: N/A;   ESOPHAGOGASTRODUODENOSCOPY (EGD) WITH PROPOFOL N/A 08/08/2017   Procedure: ESOPHAGOGASTRODUODENOSCOPY (EGD) WITH PROPOFOL;  Surgeon: [name], MD;  Location: [facility] ENDOSCOPY;  Service: Endoscopy;  Laterality: N/A;   GIVENS CAPSULE STUDY N/A 10/28/2018   Procedure: GIVENS CAPSULE STUDY;  Surgeon: Jonathon Bellows, MD;  Location: [facility] ENDOSCOPY;  Service: Gastroenterology;  Laterality: N/A;   heart [place]     LEFT HEART CATH AND CORONARY ANGIOGRAPHY N/A 12/23/2016   Procedure: LEFT HEART CATH AND CORONARY ANGIOGRAPHY;  Surgeon: Teodoro Spray, MD;  Location: [place] CV LAB;  Service: Cardiovascular;  Laterality: N/A;   LOWER EXTREMITY ANGIOGRAPHY Left 06/26/2018   Procedure: Lower Extremity Angiography;  Surgeon: Katha Cabal, MD;  Location: [place] CV LAB;  Service: Cardiovascular;  Laterality: Left;   TEE WITHOUT CARDIOVERSION N/A 10/29/2018   Procedure: TRANSESOPHAGEAL ECHOCARDIOGRAM (TEE);  Surgeon: Teodoro Spray, MD;  Location: [facility] ORS;  Service: Cardiovascular;  Laterality: N/A;    Prior to Admission medications   Medication Sig Start Date End Date Taking? Authorizing Provider  atorvastatin (LIPITOR) 40 MG tablet Take 40 mg by mouth daily.  Yes [provider]  carvedilol (COREG) 3.125 MG tablet Take 1 tablet (3.125 mg total) by mouth 2 (two) times daily with a meal. 08/21/18  Yes Ojie, Jude, MD  fluticasone (FLONASE) 50 MCG/ACT nasal spray Place 2 sprays into both nostrils daily.   Yes [provider]  folic acid (FOLVITE) 1 MG tablet Take 1 tablet (1 mg total) by mouth daily. 08/21/18  Yes Ojie, Jude, MD  Insulin Glargine (LANTUS SOLOSTAR) 100 UNIT/ML Solostar Pen Inject 10 Units into the skin daily. 11/05/18  Yes Dustin Flock, MD  Multiple Vitamin (MULTIVITAMIN WITH MINERALS) TABS tablet Take 1 tablet by mouth daily. 06/28/18  Yes Lang Snow, NP  spironolactone  (ALDACTONE) 25 MG tablet Take 12.5 mg by mouth daily.   Yes [provider]  torsemide (DEMADEX) 20 MG tablet Take 20 mg by mouth daily. Take 2 tablets if weight is greater than 185 lbs. NO tablets if weight is less than 177 lbs.   Yes [provider]  ferrous sulfate 325 (65 FE) MG tablet Take 1 tablet (325 mg total) by mouth 2 (two) times daily with a meal. Patient not taking: Reported on 11/20/2018 08/21/18   Otila Back, MD  furosemide (LASIX) 40 MG tablet Take 1 tablet (40 mg total) by mouth 2 (two) times daily. Patient not taking: Reported on 11/20/2018 11/06/18   Dustin Flock, MD    Current Facility-Administered Medications  Medication Dose Route Frequency Provider Last Rate Last Dose   carvedilol (COREG) tablet 3.125 mg  3.125 mg Oral BID WC Bloomfield, Carley D, DO       dextrose 5 % solution   Intravenous Continuous Modena Nunnery D, DO 125 mL/hr at 11/21/18 0836     insulin aspart (novoLOG) injection 0-15 Units  0-15 Units Subcutaneous TID WC Bloomfield, Carley D, DO   2 Units at 11/21/18 0758   insulin aspart (novoLOG) injection 0-5 Units  0-5 Units Subcutaneous QHS Bloomfield, Carley D, DO       pantoprazole (PROTONIX) injection 40 mg  40 mg Intravenous Q12H Bloomfield, Carley D, DO        Allergies as of 11/20/2018 - Review Complete 11/20/2018  Allergen Reaction Noted   Latex Anaphylaxis and Rash 11/07/2012   Gabapentin Nausea And Vomiting and Other (See Comments) 12/20/2012    Family History  Problem Relation Age of Onset   Hypertension Mother    Diabetes Mellitus II Mother    Breast cancer Mother    Heart disease Father    Cancer Sister     Social History   Socioeconomic History   Marital status: Married    Spouse name: Not on file   Number of children: Not on file   Years of education: Not on file   Highest education level: Not on file  Occupational History   Not on file  Social Needs   Financial resource strain: Not on  file   Food insecurity    Worry: Not on file    Inability: Not on file   Transportation needs    Medical: Not on file    Non-medical: Not on file  Tobacco Use   Smoking status: Current Some Day Smoker    Packs/day: 0.25    Types: Cigarettes   Smokeless tobacco: Never Used  Substance and Sexual Activity   Alcohol use: No   Drug use: No   Sexual activity: Not on file  Lifestyle   Physical activity    Days per week: Not on file  Minutes per session: Not on file   Stress: Not on file  Relationships   Social connections    Talks on phone: Not on file    Gets together: Not on file    Attends religious service: Not on file    Active member of club or organization: Not on file    Attends meetings of clubs or organizations: Not on file    Relationship status: Not on file   Intimate partner violence    Fear of current or ex partner: Not on file    Emotionally abused: Not on file    Physically abused: Not on file    Forced sexual activity: Not on file  Other Topics Concern   Not on file  Social History Narrative   Not on file    Review of Systems: All systems reviewed and negative except where noted in HPI.  Physical Exam: Vital signs in last 24 hours: Temp:  [97.6 F (36.4 C)-98 F (36.7 C)] 97.6 F (36.4 C) (11/04 0453) Pulse Rate:  [68-81] 68 (11/04 0453) Resp:  [10-20] 18 (11/04 0453) BP: (119-167)/(66-101) 119/66 (11/04 0453) SpO2:  [96 %-100 %] 100 % (11/04 0453) Last BM Date: 11/21/18 General:   Nearly obtunded black female Psych:  Unable to be assessed Ears:  Unable to assess. Nose:  No deformity, discharge,  or lesions. Neck:  Supple; no masses Lungs:  Chest is clear.  Heart:  Regular rate and rhythm, 1+BLE edema Abdomen:  Soft, non-distended. BS active, no palp mass   Rectal:  Deferred  Msk:  Symmetrical without gross deformities. . Neurologic:  Doesn't follow commands. Difficult to arouse.     Intake/Output from previous day: 11/03  0701 - 11/04 0700 In: 2142.7 [I.V.:968.7; Blood:955; IV Piggyback:218.9] Out: 100 [Urine:100] Intake/Output this shift: No intake/output data recorded.  Lab Results: Recent Labs    11/20/18 2258 11/21/18 0249 11/21/18 0736  WBC 5.5 5.3 5.2  HGB 8.1* 7.9* 7.7*  HCT 28.0* 25.9* 26.0*  PLT 119* 108* 111*   BMET Recent Labs    11/20/18 1230 11/21/18 0249  NA 147* 148*  K 4.7 4.4  CL 111 114*  CO2 24 22  GLUCOSE 146* 142*  BUN 60* 62*  CREATININE 2.72* 2.79*  CALCIUM 8.8* 9.0   LFT Recent Labs    11/21/18 0249  PROT 7.9  ALBUMIN 2.5*  AST 20  ALT 10  ALKPHOS 128*  BILITOT 1.5*   PT/INR Recent Labs    11/21/18 0249  LABPROT 17.7*  INR 1.5*   Hepatitis Panel No results for input(s): HEPBSAG, HCVAB, HEPAIGM, HEPBIGM in the last 72 hours.   . CBC Latest Ref Rng & Units 11/21/2018 11/21/2018 11/20/2018  WBC 4.0 - 10.5 K/uL 5.2 5.3 5.5  Hemoglobin 12.0 - 15.0 g/dL 7.7(L) 7.9(L) 8.1(L)  Hematocrit 36.0 - 46.0 % 26.0(L) 25.9(L) 28.0(L)  Platelets 150 - 400 K/uL 111(L) 108(L) 119(L)    . CMP Latest Ref Rng & Units 11/21/2018 11/20/2018 11/06/2018  Glucose 70 - 99 mg/dL 142(H) 146(H) 146(H)  BUN 8 - 23 mg/dL 62(H) 60(H) 40(H)  Creatinine 0.44 - 1.00 mg/dL 2.79(H) 2.72(H) 1.92(H)  Sodium 135 - 145 mmol/L 148(H) 147(H) 139  Potassium 3.5 - 5.1 mmol/L 4.4 4.7 3.8  Chloride 98 - 111 mmol/L 114(H) 111 103  CO2 22 - 32 mmol/L 22 24 27   Calcium 8.9 - 10.3 mg/dL 9.0 8.8(L) 7.7(L)  Total Protein 6.5 - 8.1 g/dL 7.9 8.1 -  Total Bilirubin 0.3 - 1.2  mg/dL 1.5(H) 1.3(H) -  Alkaline Phos 38 - 126 U/L 128(H) 140(H) -  AST 15 - 41 U/L 20 19 -  ALT 0 - 44 U/L 10 11 -   Studies/Results: Ct Head Wo Contrast  Result Date: 11/20/2018 CLINICAL DATA:  Altered mental status EXAM: CT HEAD WITHOUT CONTRAST TECHNIQUE: Contiguous axial images were obtained from the base of the skull through the vertex without intravenous contrast. COMPARISON:  August 18, 2018 FINDINGS: Brain: Mild  diffuse atrophy is stable. There is no intracranial mass, hemorrhage, extra-axial fluid collection, or midline shift. There is patchy small vessel disease throughout the centra semiovale bilaterally. There is a prior small lacunar infarct in the posterosuperior right centrum semiovale, stable. There is evidence of prior infarcts in the anterior limbs of the right internal and external capsule. There is also evidence of a prior infarct in the anterior limb and genu of the left internal capsule. No acute infarct is demonstrable on this study. Vascular: No hyperdense vessel. There is calcification in the carotid siphon regions bilaterally. Skull: The bony calvarium appears intact. Sinuses/Orbits: There is mucosal thickening in several ethmoid air cells. Other paranasal sinuses are clear. Orbits appear symmetric bilaterally. Other: Mastoids are hypoplastic but clear. IMPRESSION: Mild atrophy with patchy supratentorial small vessel disease and stable prior small scattered infarcts. No acute infarct evident. No mass or hemorrhage. Foci of arterial vascular calcification noted. Mucosal thickening noted in several ethmoid air cells. Electronically Signed   By: Lowella Grip III M.D.   On: 11/20/2018 14:01   Dg Chest Port 1 View  Result Date: 11/20/2018 CLINICAL DATA:  Mental status changes. EXAM: PORTABLE CHEST 1 VIEW COMPARISON:  10/31/2018 FINDINGS: The heart is enlarged but stable. Stable mild tortuosity and calcification of the thoracic aorta. Stable surgical changes involving the sternum and evidence of prosthetic aortic valve. Bypass surgery changes are also noted. No acute pulmonary findings. No pleural effusions. No worrisome pulmonary lesions. IMPRESSION: No acute cardiopulmonary findings. Electronically Signed   By: Marijo Sanes M.D.   On: 11/20/2018 18:51    Active Problems:   Acute on chronic blood loss anemia   Encephalopathy acute    Tye Savoy, NP-C @  11/21/2018, 8:57 AM

## 2018-11-21 NOTE — Progress Notes (Signed)
Attempted to get report. Nurse said would call back.

## 2018-11-21 NOTE — Progress Notes (Signed)
NAME:  [name], MRN:  [id number], DOB:  1955-10-22, LOS: 1 ADMISSION DATE:  11/20/2018,  Primary: [place]  CHIEF COMPLAINT:  AMS  Medical Service: Internal Medicine Teaching Service         Attending Physician: Dr. Lucious Groves, DO    First Contact: Dr. Darrick Meigs Pager: [phone]  Second Contact: Dr. Koleen Distance Pager: [phone]       After Hours (After 5p/  First Contact Pager: [phone]  weekends / holidays): Second Contact Pager: [phone]    Brief History  63 yo female who resides at a skilled nursing facility with PMH of small bowel AVMs, COPD, HFrEF, DM 2, chronic hepatitis C, HTN, HLD, and sleep apnea presented to ED for GI bleeding and altered mental status.  Hgb on admission 6.3 requiring transfusion. Previous caps endoscopy signficant for AVMs however when small bowel endoscopy was attempted, patient acutely decompensated.  Subjective  1U PRBC transfusion overnight. hgb 6.3-->8.1-->7.9. Urine very dark 1 dark tarry colored stool overnight. Somewhat less responsive from last night.  Objective   Blood pressure 119/66, pulse 68, temperature 97.6 F (36.4 C), temperature source Oral, resp. rate 18, SpO2 100 %.     Intake/Output Summary (Last 24 hours) at 11/21/2018 0534 Last data filed at 11/21/2018 0100 Gross per 24 hour  Intake 1774.59 ml  Output 0 ml  Net 1774.59 ml     Examination: GENERAL: chronically ill appearing HEENT: head atraumatic. CARDIAC: heart RRR. +1 lower extremity edema. Lower extremities cool to touch. PULMONARY: acyanotic. Upper respiratory wheezes. ABDOMEN: soft. Guarding throughout. Had large dark maroon BM while in room. NEURO: difficult to arouse. Spontaneously opens eyes.does not follow commands. Right pupil non reactive to light and is larger in diameter compared to left. Left pupil constricted but reactive to light. Does not blink to threat. Face appears symmetric. Poor muscle tone of extremities. Asterixis present.   SKIN: small ulcers and color change of lower extremities consistent with venous stasis.   Consults:  GI  Significant Diagnostic Tests:  11/3 CXR-unremarkable 11/3 CT head-no acute changes 11/4 MRI brain: report pending  Micro Data:  11/3 blood culture>> 11/3 urine culture>>NGTD  Labs    CBC Latest Ref Rng & Units 11/21/2018 11/20/2018 11/20/2018  WBC 4.0 - 10.5 K/uL 5.3 5.5 4.7  Hemoglobin 12.0 - 15.0 g/dL 7.9(L) 8.1(L) 6.3(LL)  Hematocrit 36.0 - 46.0 % 25.9(L) 28.0(L) 23.0(L)  Platelets 150 - 400 K/uL 108(L) 119(L) 120(L)   BMP Latest Ref Rng & Units 11/21/2018 11/20/2018 11/06/2018  Glucose 70 - 99 mg/dL 142(H) 146(H) 146(H)  BUN 8 - 23 mg/dL 62(H) 60(H) 40(H)  Creatinine 0.44 - 1.00 mg/dL 2.79(H) 2.72(H) 1.92(H)  Sodium 135 - 145 mmol/L 148(H) 147(H) 139  Potassium 3.5 - 5.1 mmol/L 4.4 4.7 3.8  Chloride 98 - 111 mmol/L 114(H) 111 103  CO2 22 - 32 mmol/L 22 24 27   Calcium 8.9 - 10.3 mg/dL 9.0 8.8(L) 7.7(L)   Stool occult + UA neg Procalc <0.1 UDS neg  Summary  63 yo female with PMH of small bowel AVMs, COPD, HFrEF, DM 2, chronic hepatitis C, HTN, HLD, and sleep apnea presented to ED for GI bleeding and altered mental status.   Assessment & Plan:  Active Problems:   Acute on chronic blood loss anemia  Acute on chronic blood loss anemia GI bleed. History of gastric and small bowel AVMs seen on capsule endoscopy. Small bowel endoscopy had to be terminated early due to acute decompensation. GI consulted. Transfused  1U PRBC last evening for hgb of 6.3 on admission. Post transufsion hgb ~8 however has trended back down a little again. VSS Plan Will await GI recs Transfuse for hgb <7 protonix 41m BID  Acute encephalopathy. Senior resident spoke with facility at which patient resides who notes that patient is typically a/o.  Unclear etiology at this point. Unlikely to be infection given lack of leukocytosis however blood cultures were obtained. Of note, she was recently  admitted for sepsis bacteremia. UA unremarkable. No significant electrolyte derangements however Na is elevated at 147--likely related to dehydration. CT head neg for acute process on admission. Does not appear that she is on any centrally acting medications. UDS neg. CT AP from 10/9 showed extensive anasarca, small b/l effusions, small amount of perihepatic ascites, and bilateral periphritic stranding Plan: given her somewhat worse neuro exam this morning, MRI brain--results pending.  HFrEF. TEE in October significant for EF of 50-55%. Normal systolic function. Moderate dilation of LA, RA. Moderate MVR and stenosis. Moderate TVR. On torsemide 454mat home. Plan: tele monitor. Daily weights.  Strict I/O. Gentle IVF.  Acute on CKD. Likely related to hypoperfusion and acute GI bleed. Plan: gentle IVF. Will continue to monitor. Hypernatremia. Likely related to dehydration. 3L water deficit. Will hopefully improve with hydration with D5W  HTN: stable on admission. Continue coreg. DM II: SSI  Best practice:  CODE STATUS:Full Diet: NPO GI prophylaxis: protonix 4046mID DVT for prophylaxis: hold for active bleeding. SCDs Dispo: resides at skilled facility.   RYLMitzi HansenD INTERNAL MEDICINE RESIDENT PGY-1 PAGER #: [phone]/05/07 5:34 AM

## 2018-11-21 NOTE — TOC Initial Note (Signed)
Transition of Care [facility]) - Initial/Assessment Note    Patient Details  Name: DEVONIA FARRO MRN: [id number] Date of Birth: 1955/03/06  Transition of [facility]) CM/SW Contact:    Sable Feil, LCSW Phone Number: 11/21/2018, 2:04 PM  Clinical Narrative: CSW talked by phone with patient's spouse, Dareth Andrew regarding his wife and her discharge disposition. Mr. Trott confirmed that his wife was at [facility] and he is agreeable to her retuning there at discharge. Husband expressed sadness that he is unable to visit his wife in the hospital. Mr. Ortloff explained that he is in a wheelchair and does not have transportation or persons readily available to take him where he needs to go. Husband also aware that he is unable to go into the nursing home to see his wife, but can go to the window of her room. Mr. Gavel explained that he has been the primary caretaker for his wife for 2 years.   Mr. Vadala advised CSW that his wife needs to apply for Medicare and this was discussed. He explained that his wife receives $600 in SSI and $180 SSA disability and he receives $350 SSI and $450 SSA disability, along with Medicare. CSW suggested that he can go online to the [facility] website to determine is he can apply on his wife's behalf (if he has all the necessary information to apply on her behalf) or to call SSA. Husband also reported that they have been in [place] 15 years and he is wanting to rent another house due to the problems in the home they currently live in, and this was discussed. Spouse advised to contact the public housing authority in [place] regarding their current application process (due to [place]).  Mr. Dust was advised that he will be contacted once his wife is ready for discharged and that he can call CSW if any questions.           Expected Discharge Plan: [place] Barriers to Discharge: Continued Medical Work up   Patient Goals and CMS  Choice Patient states their goals for this hospitalization and ongoing recovery are:: Unable to talk with patient as she is not totally oriented. Talked with husband and he understands that patient needs to get stronger before returning home CMS Medicare.gov Compare Post Acute Care list provided to:: Other (Comment Required)(Husband agreeable to patient returning to [facility] at discharge) Choice offered to / list presented to : NA(Patient from [facility] and will return there at discharge)  Expected Discharge Plan and Services Expected Discharge Plan: [place]                                             Prior Living Arrangements/Services   Lives with:: Spouse Patient language and need for interpreter reviewed:: No Do you feel safe going back to the place where you live?: No(Spouse agreeable to patient returning to [facility] at discharge from hospital)      Need for Family Participation in Patient Care: Yes (Comment) Care giver support system in place?: Yes (comment)   Criminal Activity/Legal Involvement Pertinent to Current Situation/Hospitalization: No - Comment as needed  Activities of Daily Living      Permission Sought/Granted Permission sought to share information with : Other (comment) Permission granted to share information with : No(Wife not oriented to situation)  Emotional Assessment Appearance:: Appears stated age(Went to room, patient asleep and husband not in room) Attitude/Demeanor/Rapport: Unable to Assess Affect (typically observed): Unable to Assess Orientation: : Oriented to Self Alcohol / Substance Use: Tobacco Use, Alcohol Use, Illicit Drugs(Patient reported that she smokes and does not drink or use illicit drugs) Psych Involvement: No (comment)  Admission diagnosis:  Altered mental status [R41.82] Symptomatic anemia [D64.9] Patient Active Problem List   Diagnosis Date Noted  . Encephalopathy acute   .  Acute on chronic blood loss anemia 11/20/2018  . Pressure injury of skin 10/26/2018  . Anxiety 08/15/2018  . Chronic back pain 08/15/2018  . GERD (gastroesophageal reflux disease) 08/15/2018  . History of carpal tunnel syndrome 08/15/2018  . HNP (herniated nucleus pulposus), lumbar 08/15/2018  . Hyperlipidemia 08/15/2018  . Hypertension 08/15/2018  . Insomnia 08/15/2018  . Left ventricular hypertrophy 08/15/2018  . Obesity 08/15/2018  . Sleep apnea 08/15/2018  . Severe anemia 06/20/2018  . Diabetic polyneuropathy associated with type 2 diabetes mellitus ([place]) 02/23/2018  . Moderate tricuspid regurgitation 02/18/2018  . Acute on chronic diastolic CHF (congestive heart failure) ([place]) 02/08/2018  . 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 Depression 02/05/2018  . Generalized abdominal pain 02/05/2018  . CHF (congestive heart failure) ([place]) 01/13/2018  . B12 deficiency 12/07/2017  . Iron deficiency anemia due to chronic blood loss 12/07/2017  . Folate deficiency 12/07/2017  . GIB (gastrointestinal bleeding) 11/03/2017  . COPD (chronic obstructive pulmonary disease) ([place]) 09/26/2017  . Symptomatic anemia 09/26/2017  . Acute on chronic systolic CHF (congestive heart failure) ([place]) 08/27/2017  . Anemia   . Weakness   . GI bleed 08/07/2017  . Chronic systolic heart failure ([place]) 12/28/2016  . Diabetes ([place]) 12/28/2016  . Tobacco use 12/28/2016  . NSTEMI (non-ST elevated myocardial infarction) ([place])   . Sepsis ([place]) 12/18/2016  . CHF exacerbation ([place]) 12/04/2015  . PNA (pneumonia) 10/28/2015  . COPD exacerbation ([place]) 08/29/2015  . Chronic hepatitis C without hepatic coma ([place]) 05/21/2014  . CAD (coronary artery disease) 07/19/2013  . Right arm pain 06/26/2013  . Cervical pain (neck) 03/28/2013  . Radiculopathy affecting upper extremity 03/28/2013  . Impaired renal function 12/21/2012  . Abnormal mammogram 12/20/2012  . Diabetes mellitus type 2, uncontrolled ([place])  12/20/2012   PCP:  [facility]:   [place], [address] [place] [address], [place] [address] Phone: [phone] Fax: [place], [place] - [address] [place] [address] Phone: [phone] Fax: [phone]  [id number] [address]: [phone]: [phone]    Social Determinants of Health (SDOH) Interventions  No SDOH interventions needed at this time  Readmission Risk Interventions Readmission Risk Prevention Plan 10/26/2018 08/21/2018 09/30/2017  Transportation Screening Complete Complete Complete  PCP or Specialist Appt within 5-7 Days - - Complete  Home Care Screening - - Complete  Medication Review (RN CM) - - Complete  HRI or Home Care Consult Complete Complete -  Social Work Consult for [place]/Counseling Not Complete - -  SW consult not completed comments not recovery care - -  Palliative Care Screening Not Applicable Not Applicable -  Medication Review (RN Care Manager) Referral to Pharmacy Complete -  [place] [facility] Consult Complete - -  Palliative Care Screening  Not Applicable - -  Some recent data might be hidden

## 2018-11-21 NOTE — Progress Notes (Signed)
New Admission Note: ? Arrival Method: Stretcher Mental Orientation: Alert to self and disoriented to place, time and situation Telemetry: [id number] Assessment: Completed Skin: Refer to flowsheet IV: Right Antecubital and Wrist Pain: 0 Tubes: Purewick Safety Measures: Safety Fall Prevention Plan discussed with patient. Admission: Completed 5 [place] Orientation: Patient has been orientated to the room, unit and the staff. Family: None at the bedside Orders have been reviewed and are being implemented. Will continue to monitor the patient. Call light has been placed within reach and bed alarm has been activated.  ? Milagros Loll, RN  Phone Number: [phone]

## 2018-11-21 NOTE — Progress Notes (Signed)
Patient had hematuria and blood clot found in perineum while doing pericare on patient. Lennox Grumbles, NP notified. Will continue to monitor.   Anvita Hirata,RN.

## 2018-11-21 NOTE — Plan of Care (Signed)
  Problem: Education: Goal: Knowledge of General Education information will improve Description: Including pain rating scale, medication(s)/side effects and non-pharmacologic comfort measures Outcome: Progressing   Problem: Safety: Goal: Ability to remain free from injury will improve Outcome: Progressing   Problem: Skin Integrity: Goal: Risk for impaired skin integrity will decrease Outcome: Progressing   

## 2018-11-22 ENCOUNTER — Other Ambulatory Visit: Payer: Self-pay

## 2018-11-22 ENCOUNTER — Inpatient Hospital Stay (HOSPITAL_COMMUNITY): Payer: Medicaid Other | Admitting: Certified Registered"

## 2018-11-22 ENCOUNTER — Encounter (HOSPITAL_COMMUNITY): Payer: Self-pay | Admitting: *Deleted

## 2018-11-22 ENCOUNTER — Encounter (HOSPITAL_COMMUNITY)
Admission: EM | Disposition: A | Discharge status: Skilled Nursing Facility | Payer: Self-pay | Source: Skilled Nursing Facility | Attending: Internal Medicine

## 2018-11-22 DIAGNOSIS — K2990 Gastroduodenitis, unspecified, without bleeding: Secondary | ICD-10-CM

## 2018-11-22 DIAGNOSIS — G92 Toxic encephalopathy: Secondary | ICD-10-CM

## 2018-11-22 DIAGNOSIS — K2970 Gastritis, unspecified, without bleeding: Secondary | ICD-10-CM

## 2018-11-22 DIAGNOSIS — K439 Ventral hernia without obstruction or gangrene: Secondary | ICD-10-CM

## 2018-11-22 DIAGNOSIS — D649 Anemia, unspecified: Secondary | ICD-10-CM

## 2018-11-22 HISTORY — PX: ENTEROSCOPY: SHX5533

## 2018-11-22 HISTORY — PX: BIOPSY: SHX5522

## 2018-11-22 LAB — CBC
HCT: 24.3 % — ABNORMAL LOW (ref 36.0–46.0)
HCT: 25.9 % — ABNORMAL LOW (ref 36.0–46.0)
HCT: 26.7 % — ABNORMAL LOW (ref 36.0–46.0)
HCT: 27.6 % — ABNORMAL LOW (ref 36.0–46.0)
Hemoglobin: 7.3 g/dL — ABNORMAL LOW (ref 12.0–15.0)
Hemoglobin: 7.5 g/dL — ABNORMAL LOW (ref 12.0–15.0)
Hemoglobin: 8.1 g/dL — ABNORMAL LOW (ref 12.0–15.0)
Hemoglobin: 8.2 g/dL — ABNORMAL LOW (ref 12.0–15.0)
MCH: 27.5 pg (ref 26.0–34.0)
MCH: 27.5 pg (ref 26.0–34.0)
MCH: 27.6 pg (ref 26.0–34.0)
MCH: 27.3 pg (ref 26.0–34.0)
MCHC: 30 g/dL (ref 30.0–36.0)
MCHC: 29 g/dL — ABNORMAL LOW (ref 30.0–36.0)
MCHC: 30.3 g/dL (ref 30.0–36.0)
MCHC: 29.7 g/dL — ABNORMAL LOW (ref 30.0–36.0)
MCV: 91.7 fL (ref 80.0–100.0)
MCV: 94.9 fL (ref 80.0–100.0)
MCV: 90.8 fL (ref 80.0–100.0)
MCV: 92 fL (ref 80.0–100.0)
Platelets: 103 10*3/uL — ABNORMAL LOW (ref 150–400)
Platelets: 103 10*3/uL — ABNORMAL LOW (ref 150–400)
Platelets: 102 10*3/uL — ABNORMAL LOW (ref 150–400)
Platelets: 103 10*3/uL — ABNORMAL LOW (ref 150–400)
RBC: 2.65 MIL/uL — ABNORMAL LOW (ref 3.87–5.11)
RBC: 2.73 MIL/uL — ABNORMAL LOW (ref 3.87–5.11)
RBC: 2.94 MIL/uL — ABNORMAL LOW (ref 3.87–5.11)
RBC: 3 MIL/uL — ABNORMAL LOW (ref 3.87–5.11)
RDW: 24.5 % — ABNORMAL HIGH (ref 11.5–15.5)
RDW: 25.2 % — ABNORMAL HIGH (ref 11.5–15.5)
RDW: 23.5 % — ABNORMAL HIGH (ref 11.5–15.5)
RDW: 23.6 % — ABNORMAL HIGH (ref 11.5–15.5)
WBC: 5.5 10*3/uL (ref 4.0–10.5)
WBC: 5.7 10*3/uL (ref 4.0–10.5)
WBC: 6.1 10*3/uL (ref 4.0–10.5)
WBC: 6.4 10*3/uL (ref 4.0–10.5)
nRBC: 0 % (ref 0.0–0.2)
nRBC: 0 % (ref 0.0–0.2)
nRBC: 0 % (ref 0.0–0.2)
nRBC: 0.3 % — ABNORMAL HIGH (ref 0.0–0.2)

## 2018-11-22 LAB — BASIC METABOLIC PANEL
Anion gap: 11 (ref 5–15)
BUN: 57 mg/dL — ABNORMAL HIGH (ref 8–23)
CO2: 22 mmol/L (ref 22–32)
Calcium: 8.7 mg/dL — ABNORMAL LOW (ref 8.9–10.3)
Chloride: 115 mmol/L — ABNORMAL HIGH (ref 98–111)
Creatinine, Ser: 2.6 mg/dL — ABNORMAL HIGH (ref 0.44–1.00)
GFR calc Af Amer: 22 mL/min — ABNORMAL LOW (ref >60)
GFR calc non Af Amer: 19 mL/min — ABNORMAL LOW (ref >60)
Glucose, Bld: 141 mg/dL — ABNORMAL HIGH (ref 70–99)
Potassium: 3.8 mmol/L (ref 3.5–5.1)
Sodium: 148 mmol/L — ABNORMAL HIGH (ref 135–145)

## 2018-11-22 LAB — GLUCOSE, CAPILLARY
Glucose-Capillary: 126 mg/dL — ABNORMAL HIGH (ref 70–99)
Glucose-Capillary: 154 mg/dL — ABNORMAL HIGH (ref 70–99)
Glucose-Capillary: 145 mg/dL — ABNORMAL HIGH (ref 70–99)
Glucose-Capillary: 142 mg/dL — ABNORMAL HIGH (ref 70–99)
Glucose-Capillary: 142 mg/dL — ABNORMAL HIGH (ref 70–99)

## 2018-11-22 LAB — BLOOD GAS, ARTERIAL
Acid-Base Excess: 0.7 mmol/L (ref 0.0–2.0)
Bicarbonate: 24.2 mmol/L (ref 20.0–28.0)
Drawn by: 30136
FIO2: 21
O2 Saturation: 98 %
Patient temperature: 36.5
pCO2 arterial: 33.7 mmHg (ref 32.0–48.0)
pH, Arterial: 7.467 — ABNORMAL HIGH (ref 7.350–7.450)
pO2, Arterial: 89.8 mmHg (ref 83.0–108.0)

## 2018-11-22 LAB — HEPATIC FUNCTION PANEL
ALT: 11 U/L (ref 0–44)
AST: 21 U/L (ref 15–41)
Albumin: 2.5 g/dL — ABNORMAL LOW (ref 3.5–5.0)
Alkaline Phosphatase: 117 U/L (ref 38–126)
Bilirubin, Direct: 0.5 mg/dL — ABNORMAL HIGH (ref 0.0–0.2)
Indirect Bilirubin: 1.1 mg/dL — ABNORMAL HIGH (ref 0.3–0.9)
Total Bilirubin: 1.6 mg/dL — ABNORMAL HIGH (ref 0.3–1.2)
Total Protein: 7.5 g/dL (ref 6.5–8.1)

## 2018-11-22 LAB — AMMONIA: Ammonia: 101 umol/L — ABNORMAL HIGH (ref 9–35)

## 2018-11-22 LAB — VITAMIN B12: Vitamin B-12: 1062 pg/mL — ABNORMAL HIGH (ref 180–914)

## 2018-11-22 LAB — PREPARE RBC (CROSSMATCH)

## 2018-11-22 SURGERY — ENTEROSCOPY
Anesthesia: Monitor Anesthesia Care

## 2018-11-22 MED ORDER — LACTULOSE ENEMA
300.0000 mL | Freq: Once | ORAL | Status: DC
  Filled 2018-11-22: qty 300

## 2018-11-22 MED ORDER — PROPOFOL 500 MG/50ML IV EMUL
INTRAVENOUS | Status: DC | PRN
  Administered 2018-11-22: 40 ug/kg/min via INTRAVENOUS

## 2018-11-22 MED ORDER — DEXTROSE 5 % IV SOLN
INTRAVENOUS | Status: AC
  Administered 2018-11-22: 07:00:00 via INTRAVENOUS

## 2018-11-22 MED ORDER — PROPOFOL 10 MG/ML IV BOLUS
INTRAVENOUS | Status: DC | PRN
  Administered 2018-11-22: 10 mg via INTRAVENOUS

## 2018-11-22 MED ORDER — SODIUM CHLORIDE 0.9 % IV SOLN
INTRAVENOUS | Status: DC | PRN
  Administered 2018-11-22: 12:00:00 via INTRAVENOUS

## 2018-11-22 MED ORDER — SODIUM CHLORIDE 0.9% IV SOLUTION: Freq: Once | INTRAVENOUS | Status: DC

## 2018-11-22 SURGICAL SUPPLY — 15 items

## 2018-11-22 NOTE — Consult Note (Addendum)
Neurology Consultation  Reason for Consult: Encephalopathy Referring Physician: Darrick Meigs [name]   History is obtained from: Chart  HPI: [name] is a 63 y.o. female with history of upper GI bleed, non-STEMI, gastric ulcer, diabetes, COPD, chronic anemia, CHF and cervical cancer.  Patient initially presented to the ED for GI bleed and altered mental status.  Patient currently unable to give any information as she is laying in bed with heavy breathing and only responding to pain.  During work-up it was noted that patient had a ammonia of 108.  Neurology was consulted for altered mental status/encephalopathy  Attending addendum Spoke with the husband over the phone.  He says that she has been in the nursing home since her last admission to [facility] for GI bleed.  He has spoken with her 2 days before this admission and she was awake alert and he was told that she was found down at the facility by her bed.  He is most concerned with the fluid in her legs that has been making it unable for her to walk and require rehabilitation. He requests call back with update as he himself is wheelchair-bound and will not be able to come see her.   Past Medical History:  Diagnosis Date  . Cervical cancer ([place])   . CHF (congestive heart failure) ([place])   . Chronic anemia   . COPD (chronic obstructive pulmonary disease) ([place])   . Diabetes mellitus without complication ([place])   . Gastric ulcer   . NSTEMI (non-ST elevated myocardial infarction) ([place])   . Upper GI bleed      Family History  Problem Relation Age of Onset  . Hypertension Mother   . Diabetes Mellitus II Mother   . Breast cancer Mother   . Heart disease Father   . Cancer Sister    Social History:   reports that she has been smoking cigarettes. She has been smoking about 0.25 packs per day. She has never used smokeless tobacco. She reports that she does not drink alcohol or use drugs.  Medications  Current  Facility-Administered Medications:  .  0.9 %  sodium chloride infusion (Manually program via Guardrails IV Fluids), , Intravenous, Once, [place], Carley D, DO .  carvedilol (COREG) tablet 3.125 mg, 3.125 mg, Oral, BID WC, [place], Carley D, DO .  dextrose 5 % solution, , Intravenous, Continuous, [place], Carley D, DO, Last Rate: 125 mL/hr at 11/22/18 0728 .  insulin aspart (novoLOG) injection 0-15 Units, 0-15 Units, Subcutaneous, TID WC, [place], Carley D, DO, 2 Units at 11/21/18 1724 .  insulin aspart (novoLOG) injection 0-5 Units, 0-5 Units, Subcutaneous, QHS, [place], Carley D, DO .  pantoprazole (PROTONIX) 80 mg in sodium chloride 0.9 % 250 mL (0.32 mg/mL) infusion, 8 mg/hr, Intravenous, Continuous, Lucious Groves, DO, Last Rate: 25 mL/hr at 11/22/18 0436, 8 mg/hr at 11/22/18 0436   Exam: Current vital signs: BP (!) 144/75 (BP Location: Right Arm)   Pulse 72   Temp (!) 97.4 F (36.3 C) (Oral)   Resp 14   SpO2 100%  Vital signs in last 24 hours: Temp:  [97.3 F (36.3 C)-97.7 F (36.5 C)] 97.4 F (36.3 C) (11/05 0912) Pulse Rate:  [70-80] 72 (11/05 0912) Resp:  [14-18] 14 (11/05 0912) BP: (127-144)/(67-80) 144/75 (11/05 0912) SpO2:  [95 %-100 %] 100 % (11/05 0912)  ROS: Unable to obtain secondary to patient's encephalopathy and nonresponsiveness.   Physical Exam  Constitutional: Obese Psych: Currently encephalopathic and not responding Eyes:  No scleral injection HENT: No OP obstrucion Head: Normocephalic.  Cardiovascular: Normal rate and regular rhythm.  Respiratory: Patient shows slightly labored breathing with grunting GI: Soft.    Distended but no tenderness Skin: WDI with significant dry skin on her feet extending to mid calf.  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  Chronic venous stasis changes.  Neuro: Mental Status: Patient is very encephalopathic.  She is only responding to pain at this time. Cranial Nerves: II: Visual Fields are full.  III,IV, VI:  Positive doll's response.  Right pupil is about 4 mm nonreactive with significant cataract noted.  Left pupil is reactive with cataract notable V: Grimaces to pain VII: Facial symmetric.  VIII: No response to voice  Motor: Patient has normal tone, she is not moving any extremity.  To noxious stimuli she grimaces.  Notable asterixis is noted in both upper proximal and distal extremities Sensory: As noted she grimaces to noxious stimuli in all 4 extremities less extent in the upper extremities Deep Tendon Reflexes: 2+ bilaterally in the upper extremities I could not elicit a knee jerk Plantars: Downgoing toe on the right with amputated on the left Cerebellar: Could not assess  Labs I have reviewed labs in epic and the results pertinent to this consultation are:   CBC    Component Value Date/Time   WBC 5.5 11/22/2018 0649   RBC 2.65 (L) 11/22/2018 0649   HGB 7.3 (L) 11/22/2018 0649   HGB 7.3 (L) 11/23/2017 1454   HCT 24.3 (L) 11/22/2018 0649   HCT 45.7 08/03/2013 2138   PLT 103 (L) 11/22/2018 0649   PLT 118 (L) 08/03/2013 2138   MCV 91.7 11/22/2018 0649   MCV 89 08/03/2013 2138   MCH 27.5 11/22/2018 0649   MCHC 30.0 11/22/2018 0649   RDW 24.5 (H) 11/22/2018 0649   RDW 13.6 08/03/2013 2138   LYMPHSABS 0.7 11/20/2018 1230   LYMPHSABS 3.4 08/03/2013 2138   MONOABS 0.1 11/20/2018 1230   MONOABS 0.7 08/03/2013 2138   EOSABS 0.2 11/20/2018 1230   EOSABS 0.3 08/03/2013 2138   BASOSABS 0.0 11/20/2018 1230   BASOSABS 0.1 08/03/2013 2138    CMP     Component Value Date/Time   NA 148 (H) 11/22/2018 0508   NA 139 08/03/2013 2138   K 3.8 11/22/2018 0508   K 3.4 (L) 08/03/2013 2138   CL 115 (H) 11/22/2018 0508   CL 104 08/03/2013 2138   CO2 22 11/22/2018 0508   CO2 27 08/03/2013 2138   GLUCOSE 141 (H) 11/22/2018 0508   GLUCOSE 153 (H) 08/03/2013 2138   BUN 57 (H) 11/22/2018 0508   BUN 16 08/03/2013 2138   CREATININE 2.60 (H) 11/22/2018 0508   CREATININE 1.19 08/03/2013  2138   CALCIUM 8.7 (L) 11/22/2018 0508   CALCIUM 9.2 08/03/2013 2138   PROT 7.5 11/22/2018 0649   PROT 9.3 (H) 03/12/2012 1104   ALBUMIN 2.5 (L) 11/22/2018 0649   ALBUMIN 3.7 03/12/2012 1104   AST 21 11/22/2018 0649   AST 40 (H) 03/12/2012 1104   ALT 11 11/22/2018 0649   ALT 34 03/12/2012 1104   ALKPHOS 117 11/22/2018 0649   ALKPHOS 199 (H) 03/12/2012 1104   BILITOT 1.6 (H) 11/22/2018 0649   BILITOT 0.5 03/12/2012 1104   GFRNONAA 19 (L) 11/22/2018 0508   GFRNONAA 50 (L) 08/03/2013 2138   GFRAA 22 (L) 11/22/2018 0508   GFRAA 58 (L) 08/03/2013 2138    Lipid Panel     Component Value Date/Time  TRIG 178 (H) 12/18/2016 1617   B12 1062 TSH 10/31/2018-3.9  Imaging I have reviewed the images obtained:  CT-scan of the brain-mild atrophy with patchy supratentorial small vessel disease and stable prior small scattered infarcts.  No acute infarct noted  MRI examination of the brain-significantly motion degraded however does not show any evidence of intracranial abnormality, including acute infarct.  She does have moderate/advanced chronic small vessel ischemic disease.  There are chronic lacunar infarcts within the right frontoparietal white matter.  Tanya Quill PA-C [facility] [phone]  M-F  (9:00 am- 5:00 PM)  11/22/2018, 10:12 AM   Attending addendum Patient seen and examined Imaging reviewed personally.  Motion degraded MRI with chronic white matter disease, chronic lacunar infarcts.  No acute stroke. Have spoken to the husband over the phone as well.  Reports relatively poor baseline in terms of functionality for this patient. Agree with the exam above.  Nonfocal and encephalopathic exam.  Assessment:  63 year old female presented to hospital altered mental status.  Currently patient is extremely encephalopathic likely secondary to elevated ammonia levels.  Ammonia levels on admission found to be 104.  Patient remains severely encephalopathic with notable  asterixis.   She has had multiple blood transfusion for GI bleed since October. She is being followed by the GI service and was being wheeled to the Endo suite for endoscopy.  Impression: -Encephalopathy-likely toxic metabolic -Deconditioning due to recent hospital admissions, GI bleed and current comorbidities that include CHF with exacerbations, chronic anemia, COPD and diabetes.  Recommendations: -Check ABG to evaluate for acidosis -Ammonia level daily until back to normal range -Would continue lactulose and her ammonia levels normalized. -Obtain EEG -I would also check a vitamin B1 level and start replacing thiamine after drawing the lab. -Supportive care per primary team as you are.  -- Amie Portland, MD [facility] Pager: [phone] If 7pm to 7am, please call on call as listed on AMION.

## 2018-11-22 NOTE — Progress Notes (Signed)
RN has noticed pt to have moderate dark red vaginal bleeding this am. Dr. Darrick Meigs notified. Will continue to monitor.   Markos Theil,RN.

## 2018-11-22 NOTE — Progress Notes (Addendum)
NAME:  [name], MRN:  [id number], DOB:  08-Dec-1955, LOS: 2 ADMISSION DATE:  11/20/2018,  Primary: Miller  CHIEF COMPLAINT:  AMS  Medical Service: Internal Medicine Teaching Service         Attending Physician: Dr. Lucious Groves, DO    First Contact: Dr. Darrick Meigs Pager: [phone]  Second Contact: Dr. Koleen Distance Pager: [phone]       After Hours (After 5p/  First Contact Pager: [phone]  weekends / holidays): Second Contact Pager: [phone]    Brief History  63 yo female who resides at a skilled nursing facility with PMH of small bowel AVMs, COPD, HFrEF, DM 2, chronic hepatitis C, HTN, HLD, and sleep apnea presented to ED for GI bleeding and altered mental status.  Hgb on admission 6.3 requiring transfusion. Previous caps endoscopy signficant for AVMs however when small bowel endoscopy was attempted, patient acutely decompensated.  Subjective  Hgb drop overnight 8.1-->7.3. VSS Seen by GI yesterday who plans for push endoscopy today Bedside RN commented on vaginal bleeding overnight  Objective   Blood pressure (!) 144/75, pulse 72, temperature (!) 97.4 F (36.3 C), temperature source Oral, resp. rate 14, SpO2 100 %.     Intake/Output Summary (Last 24 hours) at 11/22/2018 0935 Last data filed at 11/22/2018 0617 Gross per 24 hour  Intake 1052.77 ml  Output 400 ml  Net 652.77 ml     Examination: GENERAL: chronically ill appearing HEENT: head atraumatic. Cataract of right eye. CARDIAC: heart RRR. +1 lower extremity edema. Lower extremities cool to touch. PULMONARY: acyanotic. Upper respiratory wheezes. ABDOMEN: soft. Ventral hernia. bs active. Bloody bowel movement overnight. NEURO: opens eyes spontaneously. Does not respond to voice or pain. Does not follow commands. Right pupil large and not reactive to light. Left pupil pinpoint and reactive to light. Does not track. Fixed gaze. Corneal reflex intact.  SKIN: small ulcers and color change of lower  extremities consistent with venous stasis.   Consults:  GI Neurology  Significant Diagnostic Tests:  11/3 CXR-unremarkable 11/3 CT head-no acute changes 11/4 MRI brain: no acute changes 11/5 EEG>>  Micro Data:  11/3 blood culture>>NGTD 11/3 urine culture>>NGTD  Labs    CBC Latest Ref Rng & Units 11/22/2018 11/21/2018 11/21/2018  WBC 4.0 - 10.5 K/uL 5.5 6.0 6.5  Hemoglobin 12.0 - 15.0 g/dL 7.3(L) 7.3(L) 8.1(L)  Hematocrit 36.0 - 46.0 % 24.3(L) 25.3(L) 27.2(L)  Platelets 150 - 400 K/uL 103(L) 103(L) 113(L)   BMP Latest Ref Rng & Units 11/22/2018 11/21/2018 11/20/2018  Glucose 70 - 99 mg/dL 141(H) 142(H) 146(H)  BUN 8 - 23 mg/dL 57(H) 62(H) 60(H)  Creatinine 0.44 - 1.00 mg/dL 2.60(H) 2.79(H) 2.72(H)  Sodium 135 - 145 mmol/L 148(H) 148(H) 147(H)  Potassium 3.5 - 5.1 mmol/L 3.8 4.4 4.7  Chloride 98 - 111 mmol/L 115(H) 114(H) 111  CO2 22 - 32 mmol/L 22 22 24   Calcium 8.9 - 10.3 mg/dL 8.7(L) 9.0 8.8(L)   Stool occult + UA neg Procalc <0.1 UDS neg Ammonia 110  Summary  63 yo female with PMH of small bowel AVMs, COPD, HFrEF, DM 2, chronic hepatitis C, HTN, HLD, and sleep apnea presented to ED for GI bleeding and altered mental status.   Assessment & Plan:  Active Problems:   Acute on chronic blood loss anemia   Encephalopathy acute  Acute on chronic blood loss anemia GI bleed. History of gastric and small bowel AVMs seen on capsule endoscopy. Small bowel endoscopy had to be terminated early  due to acute decompensation. Hgb 6.3 on admission>>2U PRBCs. GI consulted. Plans for push endoscopy today Hgb trending down; 8.1-->7.3. likely to be lower now. Nursing also noted some vaginal bleeding but unable to to see any at time of exam. VSS Plan Transfuse 1U PRBCs. 2h post transfusion h/h Transfuse for hgb <7 protonix 10m BID Endoscopy today  Acute encephalopathy. Facility reports patient typically a/o. Unclear etiology at this point.  No sign of infection. BC/UC neg. Brain  imaging neg for acute findings. UDS neg on admission. TSH checked one month ago--3.9 Ammonia level 110. Possibly hepatic encephalopathy. Does exhibit asterixis on exam. Lactulose enema given overnight. No change in mental status this morning.  Plan Will check a thiamine and b12 level EEG Neurology consulted  HFrEF. TEE in October significant for EF of 50-55%. Normal systolic function. Moderate dilation of LA, RA. Moderate MVR and stenosis. Moderate TVR. On torsemide 453mat home. Plan: tele monitor. Daily weights.  Strict I/O. Gentle IVF.  Acute on CKD. Improving. Likely related to hypoperfusion and acute GI bleed.  Hypernatremia. 3.6L water deficit. Plan: Will increase D5W. Recheck BMP in am.  HTN: stable on admission. Continue coreg. DM II. Stable. Continue SSI  Best practice:  CODE STATUS:Full Diet: NPO GI prophylaxis: protonix 4033mID DVT for prophylaxis: hold for active bleeding. SCDs Dispo: resides at skilled facility.   RYLMitzi HansenD INTERNAL MEDICINE RESIDENT PGY-1 PAGER #: [phone]/06/05 9:35 AM

## 2018-11-22 NOTE — Anesthesia Postprocedure Evaluation (Signed)
Anesthesia Post Note  Patient: MERILYN PAGAN  Procedure(s) Performed: ENTEROSCOPY (N/A ) BIOPSY     Patient location during evaluation: Endoscopy Anesthesia Type: MAC Level of consciousness: awake and alert Pain management: pain level controlled Vital Signs Assessment: post-procedure vital signs reviewed and stable Respiratory status: spontaneous breathing, nonlabored ventilation, respiratory function stable and patient connected to nasal cannula oxygen Cardiovascular status: blood pressure returned to baseline and stable Postop Assessment: no apparent nausea or vomiting Anesthetic complications: no    Last Vitals:  Vitals:   11/22/18 1219 11/22/18 1234  BP: (!) 155/59 (!) 157/76  Pulse: 71 75  Resp: (!) 9 17  Temp:    SpO2: 96% 100%    Last Pain:  Vitals:   11/22/18 1049  TempSrc: Temporal  PainSc:                  Tyler Robidoux DANIEL

## 2018-11-22 NOTE — Op Note (Signed)
[facility] Patient Name: [name] Procedure Date : 11/22/2018 MRN: [id number] Attending MD: Thornton Park MD, MD Date of Birth: 04-Oct-1955 CSN: [id number] Age: 63 Admit Type: Inpatient Procedure:                Small bowel enteroscopy Indications:              Suspected angiodysplasia (of intestine)                           Acute on chronic anemia admitted with symptomatic                            anemia and melena. Possible underlying cirrhosis.                           Has had multiple prior endoscopic procedures at                            [facility], most recently with aborted procedure due to                            respiratory compromise.                           Normal colonoscopy at [facility] 08/21/18.                           A prior capsule endoscopy showed duodenal AVM. Providers:                Thornton Park MD, MD, Ashley Jacobs, RN, Marguerita Merles, Technician Referring MD:              Medicines:                Monitored Anesthesia Care Complications:            No immediate complications. Estimated blood loss:                            Minimal. Estimated Blood Loss:     Estimated blood loss was minimal. Procedure:                Pre-Anesthesia Assessment:                           - Prior to the procedure, a History and Physical                            was performed, and patient medications and                            allergies were reviewed. The patient's tolerance of                            previous anesthesia was also reviewed. The risks  and benefits of the procedure and the sedation                            options and risks were discussed with the patient.                            All questions were answered, and informed consent                            was obtained. Prior Anticoagulants: The patient has                            taken no previous anticoagulant or  antiplatelet                            agents. ASA Grade Assessment: III - A patient with                            severe systemic disease. After reviewing the risks                            and benefits, the patient was deemed in                            satisfactory condition to undergo the procedure.                           After obtaining informed consent, the endoscope was                            passed under direct vision. Throughout the                            procedure, the patient's blood pressure, pulse, and                            oxygen saturations were monitored continuously. The                            PCF-H190DL ([id number] Olympus pediatric colonoscope                            was introduced through the mouth and advanced to                            the jejunum, to the 160 cm mark (from the                            incisors). The small bowel enteroscopy was                            accomplished without difficulty. The patient  tolerated the procedure well. Scope In: Scope Out: Findings:      The examined esophagus was normal. No esophageal varices.      Localized mildly nodular mucosa without bleeding was found in the       gastric fundus. This is of unclear clinical significance and may be due       to scope trauma with scope retroflexion. Biopsies were taken with a cold       forceps for histology. Estimated blood loss was minimal. Biopsies were       also obtained from the antrum, body, and fundus to evaluate for H pylori       due to these findings. No blood or hematin seen. No evidence for portal       hypertensive gastropathy or gastric varices.      The examined duodenum was normal. No AVM seen. No blood or hematin seen.      There was no evidence of significant pathology in the entire examined       portion of jejunum. No mucosal abnormalities were seen. No blood or       hematin seen. The patient is friable.  There was some erythema on scope       contact in the duodenum and jejunum. Impression:               - Normal esophagus.                           - Subtle changes in the focal area of the fundus of                            unclear clinical significance. May be due to scope                            trauma that occurred during retroflexion. Biopsied.                           - Normal examined duodenum. AVM identified on                            recent capsule endoscopy was not seen today.                           - The examined portion of the jejunum was normal.                           - No evidence for portal hypertensive gastropathy                            or gastric varices.                           - Source of recurrent GI blood loss anemia not                            identified on this study. Recommendation:           - Return patient to hospital ward for ongoing care.                           -  Advance diet as tolerated today.                           - Await pathology results.                           - Given recent colonoscopy, no indication for                            repeat colonoscopy at this time. Could consider                            repeat capsule endoscopy, but, I would only do this                            when there is a better understanding of her acute                            neurologic change.                           - Serial hgb/hct with transfusion as necessary.                           - Proceed with nuclear medicine bleeding scan with                            any significant overt GI blood loss.                           The results and recommendations were discussed with                            the patient' husband by phone. All questions were                            answered to his satisfaction. Procedure Code(s):        --- Professional ---                           [phone], Small intestinal endoscopy, enteroscopy                             beyond second portion of duodenum, not including                            ileum; with biopsy, single or multiple Diagnosis Code(s):        --- Professional ---                           K31.89, Other diseases of stomach and duodenum CPT copyright 2019 [employer]. All rights reserved. The codes documented in this report are preliminary and upon coder review may  be revised to meet current compliance requirements. Thornton Park MD, MD 11/22/2018 12:40:01 PM This report has been signed  electronically. Number of Addenda: 0

## 2018-11-22 NOTE — Anesthesia Preprocedure Evaluation (Addendum)
Anesthesia Evaluation  Patient identified by MRN, date of birth, ID band Patient unresponsive    Reviewed: Allergy & Precautions, H&P , NPO status , Patient's Chart, lab work & pertinent test results  History of Anesthesia Complications Negative for: history of anesthetic complications  Airway Mallampati: II  TM Distance: >3 FB     Dental  (+) Poor Dentition   Pulmonary neg shortness of breath, sleep apnea , pneumonia, COPD (has home O2, says she no longer uses it, she is not sure if she still needs it or not),  oxygen dependent, Current Smoker,    Pulmonary exam normal        Cardiovascular Exercise Tolerance: Good hypertension, (-) angina+ CAD, + Past MI (years ago) and +CHF  (-) DOE Normal cardiovascular exam+ Valvular Problems/Murmurs (AV bioprosthesis on echo.  Pt doesn't know anything about this. Not on anticoagulation)   Echo 2019: - Left ventricle: The cavity size was normal. There was moderate   concentric hypertrophy. Systolic function was mildly to   moderately reduced. The estimated ejection fraction was in the   range of 40% to 45%. Diffuse hypokinesis. - Aortic valve: A bioprosthesis was present. - Mitral valve: Calcified annulus. Mildly thickened leaflets .   There was moderate regurgitation. - Left atrium: The appendage was moderately dilated. - Right atrium: The appendage was moderately dilated. - Tricuspid valve: There was moderate regurgitation.   Neuro/Psych PSYCHIATRIC DISORDERS Anxiety Depression  Neuromuscular disease    GI/Hepatic PUD, neg GERD  ,(+) Hepatitis -  Endo/Other  diabetes, Type 2, Insulin Dependent  Renal/GU Renal disease  negative genitourinary   Musculoskeletal   Abdominal   Peds  Hematology  (+) Blood dyscrasia, anemia , Chronic anemia, presented with severe anemia Hgb <4, Hgb currently 7.5 s/p PRBC x 3 units, currently receiving PRBC.  Denies CP, SOB, lightheadedness.  VSS    Anesthesia Other Findings   Reproductive/Obstetrics negative OB ROS                           Anesthesia Physical  Anesthesia Plan  ASA: IV  Anesthesia Plan: MAC   Post-op Pain Management:    Induction: Intravenous  PONV Risk Score and Plan: Ondansetron, Treatment may vary due to age or medical condition and Propofol infusion  Airway Management Planned: Nasal Cannula and Natural Airway  Additional Equipment:   Intra-op Plan:   Post-operative Plan:   Informed Consent: I have reviewed the patients History and Physical, chart, labs and discussed the procedure including the risks, benefits and alternatives for the proposed anesthesia with the patient or authorized representative who has indicated his/her understanding and acceptance.     Consent reviewed with POA  Plan Discussed with: Anesthesiologist  Anesthesia Plan Comments:        Anesthesia Quick Evaluation

## 2018-11-22 NOTE — Progress Notes (Cosign Needed)
Still with altered mental status. Vaginal bleeding per RN but was definitely having melena as well. However, hgb has remained stable at 7.3 following 2 uPRBC on 11/20/18.  VSS. Tentatively on for EGD today. Husband consented procedure.

## 2018-11-22 NOTE — Transfer of Care (Signed)
Immediate Anesthesia Transfer of Care Note  Patient: [name]  Procedure(s) Performed: ENTEROSCOPY (N/A ) BIOPSY  Patient Location: Endoscopy Unit  Anesthesia Type:MAC  Level of Consciousness: awake, alert  and oriented  Airway & Oxygen Therapy: Patient Spontanous Breathing and Patient connected to nasal cannula oxygen  Post-op Assessment: Report given to RN, Post -op Vital signs reviewed and stable and Patient moving all extremities  Post vital signs: Reviewed and stable  Last Vitals:  Vitals Value Taken Time  BP 155/59 11/22/18 1219  Temp    Pulse 70 11/22/18 1221  Resp 9 11/22/18 1221  SpO2 96 % 11/22/18 1221  Vitals shown include unvalidated device data.  Last Pain:  Vitals:   11/22/18 1049  TempSrc: Temporal  PainSc:          Complications: No apparent anesthesia complications

## 2018-11-22 NOTE — Plan of Care (Signed)
  Problem: Education: Goal: Knowledge of General Education information will improve Description Including pain rating scale, medication(s)/side effects and non-pharmacologic comfort measures Outcome: Progressing   

## 2018-11-23 ENCOUNTER — Inpatient Hospital Stay (HOSPITAL_COMMUNITY)
Admit: 2018-11-23 | Discharge: 2018-11-23 | Disposition: A | Discharge status: Home / Self Care | Payer: Medicaid Other | Attending: Internal Medicine | Admitting: Internal Medicine

## 2018-11-23 ENCOUNTER — Inpatient Hospital Stay (HOSPITAL_COMMUNITY): Payer: Medicaid Other

## 2018-11-23 ENCOUNTER — Encounter (HOSPITAL_COMMUNITY): Payer: Self-pay | Admitting: Gastroenterology

## 2018-11-23 DIAGNOSIS — E873 Alkalosis: Secondary | ICD-10-CM

## 2018-11-23 DIAGNOSIS — R4182 Altered mental status, unspecified: Secondary | ICD-10-CM

## 2018-11-23 LAB — TYPE AND SCREEN
ABO/RH(D): O POS
Antibody Screen: NEGATIVE
Unit division: 0
Unit division: 0
Unit division: 0

## 2018-11-23 LAB — BASIC METABOLIC PANEL
Anion gap: 10 (ref 5–15)
BUN: 45 mg/dL — ABNORMAL HIGH (ref 8–23)
CO2: 20 mmol/L — ABNORMAL LOW (ref 22–32)
Calcium: 8.6 mg/dL — ABNORMAL LOW (ref 8.9–10.3)
Chloride: 115 mmol/L — ABNORMAL HIGH (ref 98–111)
Creatinine, Ser: 2.44 mg/dL — ABNORMAL HIGH (ref 0.44–1.00)
GFR calc Af Amer: 23 mL/min — ABNORMAL LOW (ref >60)
GFR calc non Af Amer: 20 mL/min — ABNORMAL LOW (ref >60)
Glucose, Bld: 156 mg/dL — ABNORMAL HIGH (ref 70–99)
Potassium: 3.7 mmol/L (ref 3.5–5.1)
Sodium: 145 mmol/L (ref 135–145)

## 2018-11-23 LAB — CBC
HCT: 25.2 % — ABNORMAL LOW (ref 36.0–46.0)
HCT: 26.6 % — ABNORMAL LOW (ref 36.0–46.0)
HCT: 27 % — ABNORMAL LOW (ref 36.0–46.0)
Hemoglobin: 7.6 g/dL — ABNORMAL LOW (ref 12.0–15.0)
Hemoglobin: 7.9 g/dL — ABNORMAL LOW (ref 12.0–15.0)
Hemoglobin: 8.2 g/dL — ABNORMAL LOW (ref 12.0–15.0)
MCH: 27.7 pg (ref 26.0–34.0)
MCH: 27.9 pg (ref 26.0–34.0)
MCH: 28.2 pg (ref 26.0–34.0)
MCHC: 30.2 g/dL (ref 30.0–36.0)
MCHC: 29.7 g/dL — ABNORMAL LOW (ref 30.0–36.0)
MCHC: 30.4 g/dL (ref 30.0–36.0)
MCV: 92 fL (ref 80.0–100.0)
MCV: 94 fL (ref 80.0–100.0)
MCV: 92.8 fL (ref 80.0–100.0)
Platelets: 99 10*3/uL — ABNORMAL LOW (ref 150–400)
Platelets: 95 10*3/uL — ABNORMAL LOW (ref 150–400)
Platelets: 105 10*3/uL — ABNORMAL LOW (ref 150–400)
RBC: 2.74 MIL/uL — ABNORMAL LOW (ref 3.87–5.11)
RBC: 2.83 MIL/uL — ABNORMAL LOW (ref 3.87–5.11)
RBC: 2.91 MIL/uL — ABNORMAL LOW (ref 3.87–5.11)
RDW: 23.8 % — ABNORMAL HIGH (ref 11.5–15.5)
RDW: 24 % — ABNORMAL HIGH (ref 11.5–15.5)
RDW: 23.9 % — ABNORMAL HIGH (ref 11.5–15.5)
WBC: 6.3 10*3/uL (ref 4.0–10.5)
WBC: 5.9 10*3/uL (ref 4.0–10.5)
WBC: 5.9 10*3/uL (ref 4.0–10.5)
nRBC: 0.5 % — ABNORMAL HIGH (ref 0.0–0.2)
nRBC: 0.8 % — ABNORMAL HIGH (ref 0.0–0.2)
nRBC: 0.9 % — ABNORMAL HIGH (ref 0.0–0.2)

## 2018-11-23 LAB — AMMONIA: Ammonia: 91 umol/L — ABNORMAL HIGH (ref 9–35)

## 2018-11-23 LAB — BPAM RBC
Blood Product Expiration Date: 202011122359
Blood Product Expiration Date: 202012052359
Blood Product Expiration Date: 202012052359
ISSUE DATE / TIME: 202011031711
ISSUE DATE / TIME: 202011031923
ISSUE DATE / TIME: 202011051254
Unit Type and Rh: 5100
Unit Type and Rh: 5100
Unit Type and Rh: 5100

## 2018-11-23 LAB — GLUCOSE, CAPILLARY
Glucose-Capillary: 149 mg/dL — ABNORMAL HIGH (ref 70–99)
Glucose-Capillary: 131 mg/dL — ABNORMAL HIGH (ref 70–99)
Glucose-Capillary: 128 mg/dL — ABNORMAL HIGH (ref 70–99)
Glucose-Capillary: 137 mg/dL — ABNORMAL HIGH (ref 70–99)

## 2018-11-23 LAB — SURGICAL PATHOLOGY

## 2018-11-23 MED ORDER — PANTOPRAZOLE SODIUM 40 MG IV SOLR
40.0000 mg | INTRAVENOUS | Status: DC
  Administered 2018-11-23 – 2018-11-28 (×6): 40 mg via INTRAVENOUS
  Filled 2018-11-23 (×6): qty 40

## 2018-11-23 MED ORDER — LACTULOSE 10 GM/15ML PO SOLN
20.0000 g | ORAL | Status: DC
  Filled 2018-11-23: qty 30

## 2018-11-23 MED ORDER — LACTULOSE 10 GM/15ML PO SOLN: 20.0000 g | Freq: Two times a day (BID) | ORAL | Status: DC

## 2018-11-23 MED ORDER — LACTULOSE 10 GM/15ML PO SOLN
20.0000 g | ORAL | Status: DC
  Administered 2018-11-24 (×3): 20 g
  Filled 2018-11-23 (×2): qty 30

## 2018-11-23 MED ORDER — LACTULOSE ENEMA
300.0000 mL | Freq: Once | ORAL | Status: DC
  Filled 2018-11-23: qty 300

## 2018-11-23 NOTE — Progress Notes (Signed)
Progress Note    ASSESSMENT AND PLAN:   51. 63 yo female with chronic IDA / intestinal AMVs / PUD admitted with acute on chronic anemia and GI bleed ( maroon stool). She has had numerous upper and lower endoscopies by her GI in [place].  Small bowel endoscopy yesterday basically unremarkable except for some nodularity in the stomach/possibly scope trauma.  Biopsies of the area pending.  -In absence of upper GI bleeding I changed PPI drip to IV daily -Got a 3rd unit of blood last evening. Hgb still staying in mid 7 range. I spoke with RN. No bleeding this am but she got in report that patient had a large black stool last night .   -If recurrent bleeding today the nurse will notify me.  We will probably get a nuclear bleeding scan  -Continue to monitor H+H and transfuse as necessary  2. ?Cirrhosis. Suggested on non-contrast CT scan in June 2020. Subsequent CT scans haven't mentioned cirrhosis. She does have thrombocytopenia, coagulopathy. No findings of portal HTN on upper endoscopies though. She is followed outpatient by GI in [facility].   3. Altered mental status. Etiology unclear. MRI brain negative for acute findings. Ammonia level up, probably non-hepatic causes. She will be getting trial of lactulose enemas today ( got one yesterday). Subsequent ones not given due to bleeding      SUBJECTIVE   Barely able to awaken her.    OBJECTIVE:     Vital signs in last 24 hours: Temp:  [97.4 F (36.3 C)-98.2 F (36.8 C)] 98.2 F (36.8 C) (11/06 0822) Pulse Rate:  [70-98] 82 (11/06 0822) Resp:  [9-19] 18 (11/06 0822) BP: (123-158)/(59-87) 158/67 (11/06 0822) SpO2:  [94 %-100 %] 96 % (11/06 0822) Last BM Date: 11/23/18 General:   NAD Heart:  Regular rate and rhythm  Pulm: Normal respiratory effort\ Abdomen:  Soft, nondistended Normal bowel sounds.          Neurologic: Obtunded   Intake/Output from previous day: 11/05 0701 - 11/06 0700 In: 1031.8  [I.V.:1031.8] Out: 0  Intake/Output this shift: No intake/output data recorded.  Lab Results: Recent Labs    11/22/18 1838 11/22/18 2243 11/23/18 0547  WBC 6.1 6.4 6.3  HGB 8.1* 8.2* 7.6*  HCT 26.7* 27.6* 25.2*  PLT 102* 103* 99*   BMET Recent Labs    11/21/18 0249 11/22/18 0508 11/23/18 0709  NA 148* 148* 145  K 4.4 3.8 3.7  CL 114* 115* 115*  CO2 22 22 20*  GLUCOSE 142* 141* 156*  BUN 62* 57* 45*  CREATININE 2.79* 2.60* 2.44*  CALCIUM 9.0 8.7* 8.6*   LFT Recent Labs    11/22/18 0649  PROT 7.5  ALBUMIN 2.5*  AST 21  ALT 11  ALKPHOS 117  BILITOT 1.6*  BILIDIR 0.5*  IBILI 1.1*   PT/INR Recent Labs    11/21/18 0249  LABPROT 17.7*  INR 1.5*   Hepatitis Panel No results for input(s): HEPBSAG, HCVAB, HEPAIGM, HEPBIGM in the last 72 hours.  Mr Brain Wo Contrast  Result Date: 11/21/2018 CLINICAL DATA:  Altered level of consciousness (LOC), unexplained. Additional history provided: Patient presents to ED with GI bleed and altered mental status. EXAM: MRI HEAD WITHOUT CONTRAST TECHNIQUE: Multiplanar, multiecho pulse sequences of the brain and surrounding structures were obtained without intravenous contrast. COMPARISON:  Head CT examinations 11/20/2018 and earlier FINDINGS: Brain: Multiple sequences are significantly motion degraded, limiting evaluation. This includes mild motion degradation of the diffusion-weighted imaging.  Moderate motion degradation of the sagittal T1 FLAIR, axial T2 FLAIR, axial T1 weighted and coronal T2 weighted sequences. The axial gradient sequence is severely motion degraded and essentially nondiagnostic, which precludes adequate evaluation for intracranial hemorrhage. There is no convincing evidence of acute infarct. No evidence of intracranial mass. No midline shift or extra-axial fluid collection. Moderate/advanced patchy and confluent T2/FLAIR hyperintensity within the cerebral white matter consistent with chronic small vessel ischemic  disease. Chronic lacunar infarcts within the right frontoparietal white matter, bilateral basal ganglia, left thalamus and bilateral cerebellar hemispheres. Cerebral volume is normal for age. Vascular: Flow voids maintained within the proximal large arterial vessels. Skull and upper cervical spine: No focal marrow lesion identified on motion degraded imaging. Incompletely assessed cervical spondylosis. Sinuses/Orbits: Visualized orbits demonstrate no acute abnormality. Trace paranasal sinus mucosal thickening. Small bilateral mastoid effusions. IMPRESSION: 1. Significantly motion degraded and limited examination as described. 2. No evidence of acute intracranial abnormality, including acute infarction. 3. Moderate/advanced chronic small vessel ischemic disease. Chronic lacunar infarcts within the right frontoparietal white matter, bilateral basal ganglia, left thalamus and cerebellum. 4. Small bilateral mastoid effusions. Electronically Signed   By: Kellie Simmering DO   On: 11/21/2018 11:56    Active Problems:   Acute on chronic blood loss anemia   Encephalopathy acute   Gastritis and gastroduodenitis     LOS: 3 days   Tye Savoy ,NP 11/23/2018, 9:45 AM

## 2018-11-23 NOTE — Progress Notes (Signed)
EEG complete - results pending 

## 2018-11-23 NOTE — Progress Notes (Addendum)
NAME:  ARLETHA MARSCHKE, MRN:  [id number], DOB:  February 05, 1955, LOS: 3 ADMISSION DATE:  11/20/2018,  Primary: [place]   Medical Service: Internal Medicine Teaching Service          Brief History  63 yo female who resides at a skilled nursing facility with PMH of small bowel AVMs, COPD, HFrEF, DM 2, chronic hepatitis C, HTN, HLD, and sleep apnea presented to ED for GI bleeding and altered mental status.  Hgb on admission 6.3 requiring transfusionx2. Previous caps endoscopy signficant for AVMs however when small bowel endoscopy was attempted, patient acutely decompensated. Repeat endoscopy on 11/5 did not reveal etiology of bleed despite continued GI bleeding. Patient also presented with acute encephalopathy. Husband and facility notes that patient was previously a/o. Etiology remains unclear however neuro is hopeful that it will improve with resolving hyperammonemia via lactulose.  Subjective  Nursing reports continued bloody bowel movements however was more dark this last time. VSS Spoke with patient's husband yesterday who notes that she is typically a/o. Updated him on current medical status. Due to her unequal pupil sizes, asked him about prior eye surgeries. He denies previous eye surgeries. Updated husband again today. He is agreeable to NG. Discussed ongoing care. He plans to come to the hospital tomorrow.  Objective   Blood pressure 123/61, pulse 73, temperature 98.1 F (36.7 C), temperature source Oral, resp. rate 18, SpO2 97 %.     Intake/Output Summary (Last 24 hours) at 11/23/2018 4287 Last data filed at 11/23/2018 0147 Gross per 24 hour  Intake 838.15 ml  Output 400 ml  Net 438.15 ml    Examination: GENERAL: chronically ill appearing HEENT: head atraumatic. Cataract of right eye. CARDIAC: heart RRR. +1 lower extremity edema. Lower extremities cool to touch with signs of venous stasis PULMONARY: acyanotic. Upper respiratory wheezes. ABDOMEN: soft. Ventral  hernia. bs active. Guards with palpation of abdomen NEURO: opens eyes spontaneously. Does not respond to voice. Does not follow commands. Right pupil large and not reactive to light. Left pupil pinpoint and reactive to light. Does not track.  Corneal reflex intact.  SKIN: small ulcers and color change of lower extremities consistent with venous stasis.   Consults:  GI Neurology  Significant Diagnostic Tests:  11/3 CXR-unremarkable 11/3 CT head-no acute changes 11/4 MRI brain: no acute changes 11/5 EEG>> 11/5 endoscopy>>no AVMs 11/5 surgical path>>  Micro Data:  11/3 blood culture>>NGTD 11/3 urine culture>>NGTD  Labs    CBC Latest Ref Rng & Units 11/22/2018 11/22/2018 11/22/2018  WBC 4.0 - 10.5 K/uL 6.4 6.1 5.7  Hemoglobin 12.0 - 15.0 g/dL 8.2(L) 8.1(L) 7.5(L)  Hematocrit 36.0 - 46.0 % 27.6(L) 26.7(L) 25.9(L)  Platelets 150 - 400 K/uL 103(L) 102(L) 103(L)   BMP Latest Ref Rng & Units 11/22/2018 11/21/2018 11/20/2018  Glucose 70 - 99 mg/dL 141(H) 142(H) 146(H)  BUN 8 - 23 mg/dL 57(H) 62(H) 60(H)  Creatinine 0.44 - 1.00 mg/dL 2.60(H) 2.79(H) 2.72(H)  Sodium 135 - 145 mmol/L 148(H) 148(H) 147(H)  Potassium 3.5 - 5.1 mmol/L 3.8 4.4 4.7  Chloride 98 - 111 mmol/L 115(H) 114(H) 111  CO2 22 - 32 mmol/L 22 22 24   Calcium 8.9 - 10.3 mg/dL 8.7(L) 9.0 8.8(L)   Stool occult + UA neg; Procalc <0.1 UDS neg Ammonia 110-->101>>91 11/5 ABG: pH 7.46; CO2 34; O2 90  Summary  63 yo female with PMH of small bowel AVMs, COPD, HFrEF, DM 2, chronic hepatitis C, HTN, HLD, and sleep apnea presented to ED for  GI bleeding and altered mental status.   Assessment & Plan:  Active Problems:   Acute on chronic blood loss anemia   Encephalopathy acute   Gastritis and gastroduodenitis  Acute on chronic blood loss anemia GI bleed. History of gastric and small bowel AVMs Hgb 6.3 on admission>>2U PRBCs. Received another unit 11/5 for continued GI bleeding. Post transfusion hgb 8.2 No findings on endoscopy  yesterday Hgb 8.2-->7.9 today.  VSS Plan Continue to trend h/h. Transfuse for hgb <7 protonix 49m BID  Acute encephalopathy. Etiology still remains unclear.  No sign of infection. BC/UC neg. Brain imaging neg for acute findings. UDS neg. TSH checked one month ago--3.9. Vit B1 pending. Liver fibrosis as suggested on 10/10 abd [place] Hx of HCV that was reportedly treated per husband. Care everywhere shows neg RNA as of 12/2014.  Ammonia level 110>>91.  EEG suggests moderate to severe encephalopathy--likely toxic metabolic. Neuro consulted yesterday. Recommended trending ammonia level daily with continued lactulose enemas until ammonia levels are wnl.  No significant neurological changes on exam since yesterday Plan Place NG Lactulose per tube Repeat ammonia in AM  HFrEF. TEE in October significant for EF of 50-55%. Normal systolic function. Moderate dilation of LA, RA. Moderate MVR and stenosis. Moderate TVR. Plan: tele monitor. Daily weights.  Strict I/O.  Acute on CKD. Improving. Likely related to hypoperfusion and acute GI bleed.  Hypernatremia. Resolved. Respiratory Alkalosis. Seems to be protecting airway at this time however has been somewhat tachypneic since admission. If no improvement in neurological function and respiratory function declines, will need to consider more invasive intervention.  HTN: stable on admission. Continue coreg. DM II. Stable. Continue SSI  Best practice:  CODE STATUS:Full Diet: NPO.  GI prophylaxis: protonix 471m DVT for prophylaxis: hold for active bleeding. SCDs Dispo: resides at skilled facility.   RYMitzi HansenMD INTERNAL MEDICINE RESIDENT PGY-1 PAGER #: [phone]/06/20 6:08 AM

## 2018-11-23 NOTE — Progress Notes (Addendum)
NEURO HOSPITALIST PROGRESS NOTE   Subjective: Patient in bed, obtunded appearance. Does not respond to voice. Grimaces and moans to noxious.  Exam: Vitals:   11/23/18 0453 11/23/18 0822  BP: 123/61 (!) 158/67  Pulse: 73 82  Resp: 18 18  Temp: 98.1 F (36.7 C) 98.2 F (36.8 C)  SpO2: 97% 96%    Physical Exam   Constitutional: Appears well-developed and well-nourished.  Eyes: Normal external eye and conjunctiva. HENT: Normocephalic, no lesions, without obvious abnormality.   Musculoskeletal-no joint tenderness, left great toe amputation. Generalized edema Cardiovascular: pulses palpable Respiratory: Effort normal, non-labored breathing saturations WNL on RA GI: Soft.  No distension. There is no tenderness.  Skin: WDI    Neuro:  Mental Status: Obtunded, does not respond to voice. Patient moans to sternal rub and other noxious stimuli but does not withdraw. Cranial Nerves: Right pupil is 27m non-reactive with cataract noted, left pupil is pinpoint face appears symmetric. No response to voice Motor/Sensory:  patient has delayed response to noxious. Patient able to moan and localize, but does not withdraw to noxious stimuli.  Patient responds more to noxious in BLE than BUE. No asterixis present on exam today. Cerebellar: UTA Gait: deferred    Medications:  Scheduled: . sodium chloride   Intravenous Once  . carvedilol  3.125 mg Oral BID WC  . insulin aspart  0-15 Units Subcutaneous TID WC  . insulin aspart  0-5 Units Subcutaneous QHS  . lactulose  300 mL Rectal Once   Continuous: . pantoprozole (PROTONIX) infusion 8 mg/hr (11/22/18 1512)   PRN:  Pertinent Labs/Diagnostics:  11/23/2018 Ammonia 91 B12: 1062 B1: level pending  Mr Brain Wo Contrast  Result Date: 11/21/2018 CLINICAL DATA:  Altered level of consciousness (LOC), unexplained. Additional history provided: Patient presents to ED with GI bleed and altered mental status. EXAM: MRI  HEAD WITHOUT CONTRAST TECHNIQUE: Multiplanar, multiecho pulse sequences of the brain and surrounding structures were obtained without intravenous contrast. COMPARISON:  Head CT examinations 11/20/2018 and earlier FINDINGS: Brain: Multiple sequences are significantly motion degraded, limiting evaluation. This includes mild motion degradation of the diffusion-weighted imaging. Moderate motion degradation of the sagittal T1 FLAIR, axial T2 FLAIR, axial T1 weighted and coronal T2 weighted sequences. The axial gradient sequence is severely motion degraded and essentially nondiagnostic, which precludes adequate evaluation for intracranial hemorrhage. There is no convincing evidence of acute infarct. No evidence of intracranial mass. No midline shift or extra-axial fluid collection. Moderate/advanced patchy and confluent T2/FLAIR hyperintensity within the cerebral white matter consistent with chronic small vessel ischemic disease. Chronic lacunar infarcts within the right frontoparietal white matter, bilateral basal ganglia, left thalamus and bilateral cerebellar hemispheres. Cerebral volume is normal for age. Vascular: Flow voids maintained within the proximal large arterial vessels. Skull and upper cervical spine: No focal marrow lesion identified on motion degraded imaging. Incompletely assessed cervical spondylosis. Sinuses/Orbits: Visualized orbits demonstrate no acute abnormality. Trace paranasal sinus mucosal thickening. Small bilateral mastoid effusions. IMPRESSION: 1. Significantly motion degraded and limited examination as described. 2. No evidence of acute intracranial abnormality, including acute infarction. 3. Moderate/advanced chronic small vessel ischemic disease. Chronic lacunar infarcts within the right frontoparietal white matter, bilateral basal ganglia, left thalamus and cerebellum. 4. Small bilateral mastoid effusions. Electronically Signed   By: KKellie SimmeringDO   On: 11/21/2018 11:56      Assessment:  63year old female presented to hospital  altered mental status.  Currently patient is extremely encephalopathic possibly secondary to hyperammonemia.  Discussed with GI attending who feels that she does not have portal hypertension and does not think these ammonia levels might be the only contributing factor for encephalopathy.  Appreciate GI input. No asterixis noted today on exam.  Severely encephalopathic.  Imaging not suggestive of an acute stroke or other acute abnormalities.  Multiple old lacunar infarct seen. She has had multiple blood transfusion for GI bleed since October.  Impression: -Encephalopathy-hepatic versus toxic metabolic -Deconditioning due to recent hospital admissions, GI bleed and current comorbidities that include CHF with exacerbations, chronic anemia, COPD and diabetes.   Recommendations:  -- EEG --  Continue lactulose every 2 hours until ammonia levels normalize-discussed with GI as well. --Consider rifaximin in addition to the lactulose --daily AM ammonia levels until normalized  Laurey Morale, MSN, NP-C [facility] [phone]   Attending Neurohospitalist Addendum Patient seen and examined with APP/Resident. Agree with the history and physical as documented above. Agree with the plan as documented, which I helped formulate. I have independently reviewed the chart, obtained history, review of systems and examined the patient.I have personally reviewed pertinent head/neck/spine imaging (CT/MRI). Please feel free to call with any questions. --- Amie Portland, MD [facility] Pager: [phone]  If 7pm to 7am, please call on call as listed on AMION.

## 2018-11-23 NOTE — Procedures (Signed)
Patient Name: [name]  MRN: [id number]  Epilepsy Attending: Lora Havens  Referring Physician/Provider: Dr. Modena Nunnery Date: 11/23/2018 Duration: 28.52 mins  Patient history: 63 year old female with altered mental status.  EEG to evaluate for seizures.  Level of alertness: Obtunded/comatose  AEDs during EEG study: None  Technical aspects: This EEG study was done with scalp electrodes positioned according to the 10-20 International system of electrode placement. Electrical activity was acquired at a sampling rate of 500Hz  and reviewed with a high frequency filter of 70Hz  and a low frequency filter of 1Hz . EEG data were recorded continuously and digitally stored.   Description: EEG showed continuous generalized 2 to 5 Hz theta-delta slowing.  Triphasic waves, generalized, maximal bifrontal were also seen.  Hyperventilation and photic stimulation were not performed.  Abnormality -Continuous slow, generalized -Triphasic waves, generalized, maximal bifrontal  IMPRESSION: This study is suggestive of moderate to severe diffuse encephalopathy, possibly but not limited to toxic metabolic etiology. No seizures or epileptiform discharges were seen throughout the recording.

## 2018-11-24 DIAGNOSIS — G9340 Encephalopathy, unspecified: Secondary | ICD-10-CM

## 2018-11-24 DIAGNOSIS — K7200 Acute and subacute hepatic failure without coma: Secondary | ICD-10-CM

## 2018-11-24 LAB — COMPREHENSIVE METABOLIC PANEL
ALT: 11 U/L (ref 0–44)
AST: 16 U/L (ref 15–41)
Albumin: 2.5 g/dL — ABNORMAL LOW (ref 3.5–5.0)
Alkaline Phosphatase: 110 U/L (ref 38–126)
Anion gap: 9 (ref 5–15)
BUN: 40 mg/dL — ABNORMAL HIGH (ref 8–23)
CO2: 22 mmol/L (ref 22–32)
Calcium: 8.8 mg/dL — ABNORMAL LOW (ref 8.9–10.3)
Chloride: 115 mmol/L — ABNORMAL HIGH (ref 98–111)
Creatinine, Ser: 2.25 mg/dL — ABNORMAL HIGH (ref 0.44–1.00)
GFR calc Af Amer: 26 mL/min — ABNORMAL LOW (ref >60)
GFR calc non Af Amer: 22 mL/min — ABNORMAL LOW (ref >60)
Glucose, Bld: 149 mg/dL — ABNORMAL HIGH (ref 70–99)
Potassium: 3.8 mmol/L (ref 3.5–5.1)
Sodium: 146 mmol/L — ABNORMAL HIGH (ref 135–145)
Total Bilirubin: 1.6 mg/dL — ABNORMAL HIGH (ref 0.3–1.2)
Total Protein: 7.5 g/dL (ref 6.5–8.1)

## 2018-11-24 LAB — BLOOD GAS, ARTERIAL
Acid-base deficit: 0.9 mmol/L (ref 0.0–2.0)
Bicarbonate: 23.2 mmol/L (ref 20.0–28.0)
FIO2: 21
O2 Saturation: 89.7 %
Patient temperature: 37
pCO2 arterial: 38.2 mmHg (ref 32.0–48.0)
pH, Arterial: 7.401 (ref 7.350–7.450)
pO2, Arterial: 59.9 mmHg — ABNORMAL LOW (ref 83.0–108.0)

## 2018-11-24 LAB — CBC
HCT: 25.8 % — ABNORMAL LOW (ref 36.0–46.0)
HCT: 22.2 % — ABNORMAL LOW (ref 36.0–46.0)
HCT: 27.3 % — ABNORMAL LOW (ref 36.0–46.0)
Hemoglobin: 7.6 g/dL — ABNORMAL LOW (ref 12.0–15.0)
Hemoglobin: 6.8 g/dL — CL (ref 12.0–15.0)
Hemoglobin: 8.2 g/dL — ABNORMAL LOW (ref 12.0–15.0)
MCH: 28.1 pg (ref 26.0–34.0)
MCH: 28.9 pg (ref 26.0–34.0)
MCH: 28.1 pg (ref 26.0–34.0)
MCHC: 29.5 g/dL — ABNORMAL LOW (ref 30.0–36.0)
MCHC: 30.6 g/dL (ref 30.0–36.0)
MCHC: 30 g/dL (ref 30.0–36.0)
MCV: 95.6 fL (ref 80.0–100.0)
MCV: 94.5 fL (ref 80.0–100.0)
MCV: 93.5 fL (ref 80.0–100.0)
Platelets: 91 10*3/uL — ABNORMAL LOW (ref 150–400)
Platelets: 84 10*3/uL — ABNORMAL LOW (ref 150–400)
Platelets: 83 10*3/uL — ABNORMAL LOW (ref 150–400)
RBC: 2.7 MIL/uL — ABNORMAL LOW (ref 3.87–5.11)
RBC: 2.35 MIL/uL — ABNORMAL LOW (ref 3.87–5.11)
RBC: 2.92 MIL/uL — ABNORMAL LOW (ref 3.87–5.11)
RDW: 23.7 % — ABNORMAL HIGH (ref 11.5–15.5)
RDW: 21.3 % — ABNORMAL HIGH (ref 11.5–15.5)
RDW: 21.8 % — ABNORMAL HIGH (ref 11.5–15.5)
WBC: 8 10*3/uL (ref 4.0–10.5)
WBC: 10.5 10*3/uL (ref 4.0–10.5)
WBC: 9.1 10*3/uL (ref 4.0–10.5)
nRBC: 0.8 % — ABNORMAL HIGH (ref 0.0–0.2)
nRBC: 1 % — ABNORMAL HIGH (ref 0.0–0.2)
nRBC: 1.1 % — ABNORMAL HIGH (ref 0.0–0.2)

## 2018-11-24 LAB — AMMONIA: Ammonia: 90 umol/L — ABNORMAL HIGH (ref 9–35)

## 2018-11-24 LAB — GLUCOSE, CAPILLARY
Glucose-Capillary: 139 mg/dL — ABNORMAL HIGH (ref 70–99)
Glucose-Capillary: 138 mg/dL — ABNORMAL HIGH (ref 70–99)
Glucose-Capillary: 131 mg/dL — ABNORMAL HIGH (ref 70–99)
Glucose-Capillary: 136 mg/dL — ABNORMAL HIGH (ref 70–99)

## 2018-11-24 LAB — PREPARE RBC (CROSSMATCH)

## 2018-11-24 LAB — PROTIME-INR
INR: 1.4 — ABNORMAL HIGH (ref 0.8–1.2)
Prothrombin Time: 16.6 seconds — ABNORMAL HIGH (ref 11.4–15.2)

## 2018-11-24 MED ORDER — SODIUM CHLORIDE 0.9 % IV SOLN
1.0000 g | INTRAVENOUS | Status: DC
  Administered 2018-11-24 – 2018-11-26 (×3): 1 g via INTRAVENOUS
  Filled 2018-11-24 (×3): qty 10

## 2018-11-24 MED ORDER — SODIUM CHLORIDE 0.9% IV SOLUTION
Freq: Once | INTRAVENOUS | Status: AC
  Administered 2018-11-24: 17:00:00 via INTRAVENOUS

## 2018-11-24 MED ORDER — SALINE SPRAY 0.65 % NA SOLN
2.0000 | NASAL | Status: DC
  Administered 2018-11-24 – 2018-12-06 (×28): 2 via NASAL
  Filled 2018-11-24 (×2): qty 44

## 2018-11-24 MED ORDER — LACTULOSE ENEMA
300.0000 mL | ORAL | Status: AC
  Administered 2018-11-24 (×2): 300 mL via RECTAL
  Filled 2018-11-24 (×4): qty 300

## 2018-11-24 MED ORDER — OXYMETAZOLINE HCL 0.05 % NA SOLN
1.0000 | Freq: Two times a day (BID) | NASAL | Status: DC
  Administered 2018-11-24 – 2018-11-25 (×4): 1 via NASAL
  Filled 2018-11-24 (×2): qty 30

## 2018-11-24 MED ORDER — SODIUM CHLORIDE 0.9% IV SOLUTION: Freq: Once | INTRAVENOUS | Status: DC

## 2018-11-24 NOTE — Consult Note (Signed)
Reason for Consult: Epistaxis Referring Physician: Hospitalist  [name] is an 63 y.o. female.  HPI: 63 year old female admitted 11/3 due to GI bleeding and altered mental status.  She resides at a nursing facility and has a history of small bowel AVMs, COPD, diabetes, heptatis C, HTN, heart failure, hyperlipidemia, and sleep apnea.  She has required transfusion and is under evaluation by GI but a source of bleeding has not yet been found.  A NG tube was placed as part of medical management and has caused right-sided nose bleeding.  The NG tube has been removed but bleeding has not stopped with Afrin spray use.  Past Medical History:  Diagnosis Date  . Cervical cancer ([place])   . CHF (congestive heart failure) ([place])   . Chronic anemia   . COPD (chronic obstructive pulmonary disease) ([place])   . Diabetes mellitus without complication ([place])   . Gastric ulcer   . NSTEMI (non-ST elevated myocardial infarction) ([place])   . Upper GI bleed     Past Surgical History:  Procedure Laterality Date  . AMPUTATION Left 06/24/2018   Procedure: AMPUTATION GREAT TOE;  Surgeon: Samara Deist, DPM;  Location: [facility];  Service: Podiatry;  Laterality: Left;  . APPENDECTOMY    . BIOPSY  11/22/2018   Procedure: BIOPSY;  Surgeon: Thornton Park, MD;  Location: [place];  Service: Gastroenterology;;  . CARDIAC CATHETERIZATION    . CHOLECYSTECTOMY    . COLONOSCOPY N/A 09/28/2017   Procedure: COLONOSCOPY;  Surgeon: Toledo, Benay Pike, MD;  Location: [facility] ENDOSCOPY;  Service: Gastroenterology;  Laterality: N/A;  . COLONOSCOPY WITH PROPOFOL N/A 08/21/2018   Procedure: COLONOSCOPY WITH PROPOFOL;  Surgeon: Lin Landsman, MD;  Location: [facility] ENDOSCOPY;  Service: Gastroenterology;  Laterality: N/A;  . ENTEROSCOPY Left 11/06/2017   Procedure: ENTEROSCOPY;  Surgeon: Jonathon Bellows, MD;  Location: [facility] ENDOSCOPY;  Service: Gastroenterology;  Laterality: Left;  . ENTEROSCOPY N/A 06/22/2018   Procedure:  ENTEROSCOPY;  Surgeon: Lin Landsman, MD;  Location: [facility] ENDOSCOPY;  Service: Gastroenterology;  Laterality: N/A;  . ENTEROSCOPY N/A 08/18/2018   Procedure: ENTEROSCOPY;  Surgeon: Jonathon Bellows, MD;  Location: [facility] ENDOSCOPY;  Service: Gastroenterology;  Laterality: N/A;  . ENTEROSCOPY N/A 11/01/2018   Procedure: ENTEROSCOPY;  Surgeon: Virgel Manifold, MD;  Location: [facility] ENDOSCOPY;  Service: Endoscopy;  Laterality: N/A;  . ENTEROSCOPY N/A 11/22/2018   Procedure: ENTEROSCOPY;  Surgeon: Thornton Park, MD;  Location: [place];  Service: Gastroenterology;  Laterality: N/A;  . ESOPHAGOGASTRODUODENOSCOPY N/A 12/19/2016   Procedure: ESOPHAGOGASTRODUODENOSCOPY (EGD);  Surgeon: Lin Landsman, MD;  Location: [facility] ENDOSCOPY;  Service: Gastroenterology;  Laterality: N/A;  . ESOPHAGOGASTRODUODENOSCOPY N/A 09/28/2017   Procedure: ESOPHAGOGASTRODUODENOSCOPY (EGD);  Surgeon: Toledo, Benay Pike, MD;  Location: [facility] ENDOSCOPY;  Service: Gastroenterology;  Laterality: N/A;  . ESOPHAGOGASTRODUODENOSCOPY (EGD) WITH PROPOFOL N/A 08/08/2017   Procedure: ESOPHAGOGASTRODUODENOSCOPY (EGD) WITH PROPOFOL;  Surgeon: [name], MD;  Location: [facility];  Service: Endoscopy;  Laterality: N/A;  . GIVENS CAPSULE STUDY N/A 10/28/2018   Procedure: GIVENS CAPSULE STUDY;  Surgeon: Jonathon Bellows, MD;  Location: [facility] ENDOSCOPY;  Service: Gastroenterology;  Laterality: N/A;  . [place]    . LEFT HEART CATH AND CORONARY ANGIOGRAPHY N/A 12/23/2016   Procedure: LEFT HEART CATH AND CORONARY ANGIOGRAPHY;  Surgeon: Teodoro Spray, MD;  Location: [place] CV LAB;  Service: Cardiovascular;  Laterality: N/A;  . LOWER EXTREMITY ANGIOGRAPHY Left 06/26/2018   Procedure: Lower Extremity Angiography;  Surgeon: Katha Cabal, MD;  Location: [place] CV LAB;  Service: Cardiovascular;  Laterality: Left;  . TEE WITHOUT CARDIOVERSION N/A 10/29/2018   Procedure: TRANSESOPHAGEAL ECHOCARDIOGRAM  (TEE);  Surgeon: Teodoro Spray, MD;  Location: [facility] ORS;  Service: Cardiovascular;  Laterality: N/A;    Family History  Problem Relation Age of Onset  . Hypertension Mother   . Diabetes Mellitus II Mother   . Breast cancer Mother   . Heart disease Father   . Cancer Sister     Social History:  reports that she has been smoking cigarettes. She has been smoking about 0.25 packs per day. She has never used smokeless tobacco. She reports that she does not drink alcohol or use drugs.  Allergies:  Allergies  Allergen Reactions  . Latex Anaphylaxis and Rash  . Gabapentin Nausea And Vomiting and Other (See Comments)    Medications: I have reviewed the patient's current medications.  Results for orders placed or performed during the hospital encounter of 11/20/18 (from the past 48 hour(s))  Surgical pathology     Status: None   Collection Time: 11/22/18 12:07 PM  Result Value Ref Range   SURGICAL PATHOLOGY      SURGICAL PATHOLOGY CASE: [id number] PATIENT: Hardie Lora Surgical Pathology Report     Clinical History: EGD, R/O h. Pylori (cm)     FINAL MICROSCOPIC DIAGNOSIS:  A. STOMACH, BIOPSY: - Gastric antral mucosa showing focally active chronic nonspecific gastritis. - Gastric oxyntic mucosa with no specific histopathologic changes - Fundic gland polyp - Warthin Starry stain is negative for Helicobacter pylori   B. STOMACH, RANDOM, ABNORMAL AREA, BIOPSY: - Gastric antral mucosa showing reactive gastropathy with subepithelial crystalline iron deposits, consistent with iron pill gastropathy - Warthin Starry stain is negative for Helicobacter pylori     GROSS DESCRIPTION:  A: Received in formalin are tan, soft tissue fragments that are submitted in toto. Number: 5 size: 0.3-0.7 cm blocks: 1  B: Received in formalin are tan, soft tissue fragments that are submitted in toto. Number: 4 size: 0.3-0.4 blocks: 1 (GRP: 11/22/2018)   Final Diagnosis performe d  by Jaquita Folds, MD.   Electronically signed 11/23/2018 Technical and / or Professional components performed at [facility]. [facility], [place] [address], [place], [place] [address].  Immunohistochemistry Technical component (if applicable) was performed at [facility]. [address], [place], [place], [place] [address].   IMMUNOHISTOCHEMISTRY DISCLAIMER (if applicable): Some of these immunohistochemical stains may have been developed and the performance characteristics determine by [facility]. Some may not have been cleared or approved by the U.S. Food and Drug Administration. The FDA has determined that such clearance or approval is not necessary. This test is used for clinical purposes. It should not be regarded as investigational or for research. This laboratory is certified under the [place] (CLIA-88) as qualified to perform high complexity clinical laboratory testing.  The controls  stained appropriately.   Glucose, capillary     Status: Abnormal   Collection Time: 11/22/18 12:54 PM  Result Value Ref Range   Glucose-Capillary 145 (H) 70 - 99 mg/dL  Ammonia     Status: Abnormal   Collection Time: 11/22/18 12:56 PM  Result Value Ref Range   Ammonia 101 (H) 9 - 35 umol/L    Comment: Performed at [facility], [place] [address], [place], [place] [address]  CBC     Status: Abnormal   Collection Time: 11/22/18 12:56 PM  Result Value Ref  Range   WBC 5.7 4.0 - 10.5 K/uL   RBC 2.73 (L) 3.87 - 5.11 MIL/uL   Hemoglobin 7.5 (L) 12.0 - 15.0 g/dL   HCT 25.9 (L) 36.0 - 46.0 %   MCV 94.9 80.0 - 100.0 fL   MCH 27.5 26.0 - 34.0 pg   MCHC 29.0 (L) 30.0 - 36.0 g/dL   RDW 25.2 (H) 11.5 - 15.5 %   Platelets 103 (L) 150 - 400 K/uL    Comment: REPEATED TO VERIFY Immature Platelet Fraction may be clinically indicated, consider ordering this additional test [id number] CONSISTENT WITH PREVIOUS RESULT    nRBC  0.0 0.0 - 0.2 %    Comment: Performed at [facility], [place] [address] [place], [place] [address]  Blood gas, arterial     Status: Abnormal   Collection Time: 11/22/18  1:20 PM  Result Value Ref Range   FIO2 21.00    pH, Arterial 7.467 (H) 7.350 - 7.450   pCO2 arterial 33.7 32.0 - 48.0 mmHg   pO2, Arterial 89.8 83.0 - 108.0 mmHg   Bicarbonate 24.2 20.0 - 28.0 mmol/L   Acid-Base Excess 0.7 0.0 - 2.0 mmol/L   O2 Saturation 98.0 %   Patient temperature 36.5    Collection site RIGHT RADIAL    Drawn by [id number]    Sample type ARTERIAL DRAW    Allens test (pass/fail) PASS PASS    Comment: Performed at [facility], [place] [address], [place], [place] [address]  Glucose, capillary     Status: Abnormal   Collection Time: 11/22/18  5:03 PM  Result Value Ref Range   Glucose-Capillary 142 (H) 70 - 99 mg/dL  CBC     Status: Abnormal   Collection Time: 11/22/18  6:38 PM  Result Value Ref Range   WBC 6.1 4.0 - 10.5 K/uL   RBC 2.94 (L) 3.87 - 5.11 MIL/uL   Hemoglobin 8.1 (L) 12.0 - 15.0 g/dL   HCT 26.7 (L) 36.0 - 46.0 %   MCV 90.8 80.0 - 100.0 fL   MCH 27.6 26.0 - 34.0 pg   MCHC 30.3 30.0 - 36.0 g/dL   RDW 23.5 (H) 11.5 - 15.5 %   Platelets 102 (L) 150 - 400 K/uL    Comment: REPEATED TO VERIFY Immature Platelet Fraction may be clinically indicated, consider ordering this additional test [id number] CONSISTENT WITH PREVIOUS RESULT    nRBC 0.0 0.0 - 0.2 %    Comment: Performed at [facility], [place] [address], [place], [place] [address]  Glucose, capillary     Status: Abnormal   Collection Time: 11/22/18  9:12 PM  Result Value Ref Range   Glucose-Capillary 142 (H) 70 - 99 mg/dL  CBC     Status: Abnormal   Collection Time: 11/22/18 10:43 PM  Result Value Ref Range   WBC 6.4 4.0 - 10.5 K/uL   RBC 3.00 (L) 3.87 - 5.11 MIL/uL   Hemoglobin 8.2 (L) 12.0 - 15.0 g/dL   HCT 27.6 (L) 36.0 - 46.0 %   MCV 92.0 80.0 - 100.0 fL   MCH 27.3 26.0 - 34.0 pg   MCHC 29.7 (L) 30.0 - 36.0  g/dL   RDW 23.6 (H) 11.5 - 15.5 %   Platelets 103 (L) 150 - 400 K/uL    Comment: REPEATED TO VERIFY Immature Platelet Fraction may be clinically indicated, consider ordering this additional test [id number] CONSISTENT WITH PREVIOUS RESULT    nRBC 0.3 (H) 0.0 - 0.2 %  Comment: Performed at [facility], [place] [address] [place], [place] [address]  Ammonia     Status: Abnormal   Collection Time: 11/23/18  5:47 AM  Result Value Ref Range   Ammonia 91 (H) 9 - 35 umol/L    Comment: Performed at [facility], [place] [address], [place], [place] [address]  CBC     Status: Abnormal   Collection Time: 11/23/18  5:47 AM  Result Value Ref Range   WBC 6.3 4.0 - 10.5 K/uL   RBC 2.74 (L) 3.87 - 5.11 MIL/uL   Hemoglobin 7.6 (L) 12.0 - 15.0 g/dL   HCT 25.2 (L) 36.0 - 46.0 %   MCV 92.0 80.0 - 100.0 fL   MCH 27.7 26.0 - 34.0 pg   MCHC 30.2 30.0 - 36.0 g/dL   RDW 23.8 (H) 11.5 - 15.5 %   Platelets 99 (L) 150 - 400 K/uL    Comment: REPEATED TO VERIFY Immature Platelet Fraction may be clinically indicated, consider ordering this additional test [id number] CONSISTENT WITH PREVIOUS RESULT    nRBC 0.5 (H) 0.0 - 0.2 %    Comment: Performed at [facility], [place] [address] [place] [address] [place], [place] [address]  Glucose, capillary     Status: Abnormal   Collection Time: 11/23/18  7:00 AM  Result Value Ref Range   Glucose-Capillary 149 (H) 70 - 99 mg/dL  Basic metabolic panel     Status: Abnormal   Collection Time: 11/23/18  7:09 AM  Result Value Ref Range   Sodium 145 135 - 145 mmol/L   Potassium 3.7 3.5 - 5.1 mmol/L   Chloride 115 (H) 98 - 111 mmol/L   CO2 20 (L) 22 - 32 mmol/L   Glucose, Bld 156 (H) 70 - 99 mg/dL   BUN 45 (H) 8 - 23 mg/dL   Creatinine, Ser 2.44 (H) 0.44 - 1.00 mg/dL   Calcium 8.6 (L) 8.9 - 10.3 mg/dL   GFR calc non Af Amer 20 (L) >60 mL/min   GFR calc Af Amer 23 (L) >60 mL/min   Anion gap 10 5 - 15    Comment: Performed at [place] [address],  [place], [place] [address]  Glucose, capillary     Status: Abnormal   Collection Time: 11/23/18 11:13 AM  Result Value Ref Range   Glucose-Capillary 131 (H) 70 - 99 mg/dL  CBC     Status: Abnormal   Collection Time: 11/23/18 11:44 AM  Result Value Ref Range   WBC 5.9 4.0 - 10.5 K/uL   RBC 2.83 (L) 3.87 - 5.11 MIL/uL   Hemoglobin 7.9 (L) 12.0 - 15.0 g/dL   HCT 26.6 (L) 36.0 - 46.0 %   MCV 94.0 80.0 - 100.0 fL   MCH 27.9 26.0 - 34.0 pg   MCHC 29.7 (L) 30.0 - 36.0 g/dL   RDW 24.0 (H) 11.5 - 15.5 %   Platelets 95 (L) 150 - 400 K/uL    Comment: REPEATED TO VERIFY Immature Platelet Fraction may be clinically indicated, consider ordering this additional test [id number] CONSISTENT WITH PREVIOUS RESULT    nRBC 0.8 (H) 0.0 - 0.2 %    Comment: Performed at [facility], [place] [address] [place],  [address]  Glucose, capillary     Status: Abnormal   Collection Time: 11/23/18  4:10 PM  Result Value Ref Range   Glucose-Capillary 128 (H) 70 - 99 mg/dL  CBC     Status:  Abnormal   Collection Time: 11/23/18  4:28 PM  Result Value Ref Range   WBC 5.9 4.0 - 10.5 K/uL   RBC 2.91 (L) 3.87 - 5.11 MIL/uL   Hemoglobin 8.2 (L) 12.0 - 15.0 g/dL   HCT 27.0 (L) 36.0 - 46.0 %   MCV 92.8 80.0 - 100.0 fL   MCH 28.2 26.0 - 34.0 pg   MCHC 30.4 30.0 - 36.0 g/dL   RDW 23.9 (H) 11.5 - 15.5 %   Platelets 105 (L) 150 - 400 K/uL    Comment: REPEATED TO VERIFY PLATELET COUNT CONFIRMED BY SMEAR SPECIMEN CHECKED FOR CLOTS Immature Platelet Fraction may be clinically indicated, consider ordering this additional test [id number]    nRBC 0.9 (H) 0.0 - 0.2 %    Comment: Performed at [facility], [place] [address] [place], [place] [address]  Glucose, capillary     Status: Abnormal   Collection Time: 11/23/18  9:11 PM  Result Value Ref Range   Glucose-Capillary 137 (H) 70 - 99 mg/dL  Prepare RBC     Status: None   Collection Time: 11/24/18  4:01 AM  Result Value Ref Range   Order Confirmation       ORDER PROCESSED BY BLOOD BANK Performed at [facility], [place] [address] [place], [place] [address]   Comprehensive metabolic panel     Status: Abnormal   Collection Time: 11/24/18  5:34 AM  Result Value Ref Range   Sodium 146 (H) 135 - 145 mmol/L   Potassium 3.8 3.5 - 5.1 mmol/L   Chloride 115 (H) 98 - 111 mmol/L   CO2 22 22 - 32 mmol/L   Glucose, Bld 149 (H) 70 - 99 mg/dL   BUN 40 (H) 8 - 23 mg/dL   Creatinine, Ser 2.25 (H) 0.44 - 1.00 mg/dL   Calcium 8.8 (L) 8.9 - 10.3 mg/dL   Total Protein 7.5 6.5 - 8.1 g/dL   Albumin 2.5 (L) 3.5 - 5.0 g/dL   AST 16 15 - 41 U/L   ALT 11 0 - 44 U/L   Alkaline Phosphatase 110 38 - 126 U/L   Total Bilirubin 1.6 (H) 0.3 - 1.2 mg/dL   GFR calc non Af Amer 22 (L) >60 mL/min   GFR calc Af Amer 26 (L) >60 mL/min   Anion gap 9 5 - 15    Comment: Performed at [facility], [place] [address] [place],  [address]  Ammonia     Status: Abnormal   Collection Time: 11/24/18  5:35 AM  Result Value Ref Range   Ammonia 90 (H) 9 - 35 umol/L    Comment: Performed at [facility], [place] [address] [place], [place] [address]  CBC     Status: Abnormal   Collection Time: 11/24/18  5:35 AM  Result Value Ref Range   WBC 8.0 4.0 - 10.5 K/uL   RBC 2.70 (L) 3.87 - 5.11 MIL/uL   Hemoglobin 7.6 (L) 12.0 - 15.0 g/dL   HCT 25.8 (L) 36.0 - 46.0 %   MCV 95.6 80.0 - 100.0 fL   MCH 28.1 26.0 - 34.0 pg   MCHC 29.5 (L) 30.0 - 36.0 g/dL   RDW 23.7 (H) 11.5 - 15.5 %   Platelets 91 (L) 150 - 400 K/uL    Comment: REPEATED TO VERIFY Immature Platelet Fraction may be clinically indicated, consider ordering this additional test [id number] CONSISTENT WITH PREVIOUS RESULT    nRBC 0.8 (H) 0.0 -  0.2 %    Comment: Performed at [facility], [place] [address], [place], [place] [address]  Type and screen [place]     Status: None (Preliminary result)   Collection Time: 11/24/18  5:35 AM  Result Value Ref Range   ABO/RH(D) O POS     Antibody Screen NEG    Sample Expiration 11/27/2018,2359    Unit Number [id number]    Blood Component Type RED CELLS,LR    Unit division 00    Status of Unit ALLOCATED    Transfusion Status OK TO TRANSFUSE    Crossmatch Result Compatible    Unit Number [id number]    Blood Component Type RED CELLS,LR    Unit division 00    Status of Unit ALLOCATED    Transfusion Status OK TO TRANSFUSE    Crossmatch Result Compatible    Unit Number [id number]    Blood Component Type RBC LR PHER1    Unit division 00    Status of Unit ISSUED    Transfusion Status OK TO TRANSFUSE    Crossmatch Result      Compatible Performed at [facility], [place] [address] [place], [place] [address]   Protime-INR     Status: Abnormal   Collection Time: 11/24/18  5:42 AM  Result Value Ref Range   Prothrombin Time 16.6 (H) 11.4 - 15.2 seconds   INR 1.4 (H) 0.8 - 1.2    Comment: (NOTE) INR goal varies based on device and disease states. Performed at [facility], [place] [address], [place], [place] [address]   Glucose, capillary     Status: Abnormal   Collection Time: 11/24/18  6:45 AM  Result Value Ref Range   Glucose-Capillary 139 (H) 70 - 99 mg/dL  Blood gas, arterial     Status: Abnormal   Collection Time: 11/24/18 10:20 AM  Result Value Ref Range   FIO2 21.00    pH, Arterial 7.401 7.350 - 7.450   pCO2 arterial 38.2 32.0 - 48.0 mmHg   pO2, Arterial 59.9 (L) 83.0 - 108.0 mmHg   Bicarbonate 23.2 20.0 - 28.0 mmol/L   Acid-base deficit 0.9 0.0 - 2.0 mmol/L   O2 Saturation 89.7 %   Patient temperature 37.0    Collection site RIGHT RADIAL    Drawn by Maree Erie    Sample type ARTERIAL    Allens test (pass/fail) PASS PASS    Comment: Performed at [facility], [place] [address] [place] [address], [place], [place] [address]    Dg Abd Portable 1v  Result Date: 11/23/2018 CLINICAL DATA:  NG tube placement EXAM: PORTABLE ABDOMEN - 1 VIEW COMPARISON:  November 23, 2018 FINDINGS: The enteric tube tip  projects over the gastric body. There is a catheter that appears looped over the esophagus. This is not fully visualized on this study. The heart size is enlarged. IMPRESSION: 1. Enteric tube projects over the gastric body. 2. Second catheter or line projects over the esophagus and is only partially visualized. This may be external to the patient. Clinical correlation is recommended. Electronically Signed   By: Constance Holster M.D.   On: 11/23/2018 21:28   Dg Abd Portable 1v  Result Date: 11/23/2018 CLINICAL DATA:  Feeding tube placement EXAM: PORTABLE ABDOMEN - 1 VIEW COMPARISON:  10/31/2018 FINDINGS: The heart appears enlarged. Tip of the esophageal tube overlies the distal mediastinum. Upper gas pattern is unremarkable IMPRESSION: Tip of the esophageal tube overlies the distal mediastinum. Further advancement  by at least 15-20 cm is suggested, repeat radiograph for positioning would be advised after tube adjustment These results will be called to the ordering clinician or representative by the Radiologist Assistant, and communication documented in the PACS or zVision Dashboard. Electronically Signed   By: Donavan Foil M.D.   On: 11/23/2018 20:04    Review of Systems  Unable to perform ROS: Mental status change   Blood pressure (!) 144/74, pulse 96, temperature 97.9 F (36.6 C), temperature source Axillary, resp. rate 15, SpO2 94 %. Physical Exam  Constitutional: She appears well-developed and well-nourished. No distress.  Somnolent.  Not responsive except to pain.  HENT:  Head: Normocephalic and atraumatic.  Right Ear: External ear normal.  Left Ear: External ear normal.  Mouth/Throat: Oropharynx is clear and moist.  Nose covered by gauze, removed.  Active bleeding from right nasal passage, clot in passage.  Eyes:  Eyes closed  Neck: Neck supple.  Cardiovascular: Normal rate.  Respiratory: Effort normal.  Neurological:  Not responsive  Skin: Skin is warm and dry.  Psychiatric:   Not responsive    Assessment/Plan: Right epistaxis, coagulopathy  Her right-sided bleeding is posterior in source.  A nasal pack was placed.  See procedure note.  The pack will remain in place 3-4 days.  Recommend treatment with Keflex or something similar while pack is in place.  Saline spay to pack several times each day.  Melida Quitter 11/24/2018, 11:43 AM

## 2018-11-24 NOTE — Progress Notes (Signed)
NAME:  [name], MRN:  [id number], DOB:  [birth year], LOS: 4 ADMISSION DATE:  11/20/2018,  Primary: [place]   Medical Service: Internal Medicine Teaching Service          Brief History  63 yo female who resides at a skilled nursing facility with PMH of small bowel AVMs, COPD, HFrEF, DM 2, chronic hepatitis C, HTN, HLD, and sleep apnea presented to ED for GI bleeding and altered mental status.  Hgb on admission 6.3 requiring transfusionx2. Previous caps endoscopy signficant for AVMs however when small bowel endoscopy was attempted, patient acutely decompensated. Repeat endoscopy on 11/5 did not reveal etiology of bleed despite continued GI bleeding. Patient also presented with acute encephalopathy. Husband and facility notes that patient was previously a/o. Etiology remains unclear however neuro is hopeful that it will improve with resolving hyperammonemia via lactulose.  Subjective  NG placed yesterday for medication administration. Unfortunately, overnight, per note review, tube became kinked and was replaced. Shortly thereafter patient began to bleed from mouth/nose. Primary team seen at bedside. NG tube removed. Hemostasis achieved and 1U PRBCs ordered. Patient still bleeding this morning while seeing her at bedside. Still having decent amount of blood from the bilateral nostrils; left>right. Also had large bloody bowel movement while in room. Blood transfusion had not been started yet. hgb 7.6 this morning. No change in mental status since yesterday. Husband is coming to floor later today. VSS  Objective   Blood pressure (!) 151/94, pulse 86, temperature (!) 97.5 F (36.4 C), temperature source Oral, resp. rate 10, SpO2 98 %.     Intake/Output Summary (Last 24 hours) at 11/24/2018 0537 Last data filed at 11/23/2018 1700 Gross per 24 hour  Intake 193.64 ml  Output 1200 ml  Net -1006.36 ml    Examination: GENERAL: chronically ill appearing HEENT: head  atraumatic. Cataract of right eye. Abnormal iris shape. Blood draining from nares. Small amount of blood around the mouth. CARDIAC: heart RRR. +1 lower extremity edema. Lower extremities cool to touch with signs of venous stasis PULMONARY: mouth breathing due to nasal packing ABDOMEN: soft. Ventral hernia. bs active. Guards with palpation of abdomen NEURO: opens eyes spontaneously. Does not respond to voice. Does not follow commands. Right pupil large and not reactive to light. Left pupil pinpoint and reactive to light. Does not track.   SKIN: small ulcers and color change of lower extremities consistent with venous stasis.   Consults:  GI Neurology  Significant Diagnostic Tests:  11/3 CXR-unremarkable 11/3 CT head-no acute changes 11/4 MRI brain: no acute changes 11/5 EEG>>no epileptiform pattern 11/5 endoscopy>>no AVMs 11/5 surgical path>>  Micro Data:  11/3 blood culture>>NGTD 11/3 urine culture>>NGTD  Labs    CBC Latest Ref Rng & Units 11/23/2018 11/23/2018 11/23/2018  WBC 4.0 - 10.5 K/uL 5.9 5.9 6.3  Hemoglobin 12.0 - 15.0 g/dL 8.2(L) 7.9(L) 7.6(L)  Hematocrit 36.0 - 46.0 % 27.0(L) 26.6(L) 25.2(L)  Platelets 150 - 400 K/uL 105(L) 95(L) 99(L)   BMP Latest Ref Rng & Units 11/23/2018 11/22/2018 11/21/2018  Glucose 70 - 99 mg/dL 156(H) 141(H) 142(H)  BUN 8 - 23 mg/dL 45(H) 57(H) 62(H)  Creatinine 0.44 - 1.00 mg/dL 2.44(H) 2.60(H) 2.79(H)  Sodium 135 - 145 mmol/L 145 148(H) 148(H)  Potassium 3.5 - 5.1 mmol/L 3.7 3.8 4.4  Chloride 98 - 111 mmol/L 115(H) 115(H) 114(H)  CO2 22 - 32 mmol/L 20(L) 22 22  Calcium 8.9 - 10.3 mg/dL 8.6(L) 8.7(L) 9.0   Stool occult + UA  neg; Procalc <0.1 UDS neg Ammonia 110-->101>>91>>90 11/5 ABG: pH 7.46; CO2 34; O2 90  Summary  63 yo female with PMH of small bowel AVMs, COPD, HFrEF, DM 2, chronic hepatitis C, HTN, HLD, and sleep apnea presented to ED for GI bleeding and altered mental status.   Assessment & Plan:  Active Problems:   Acute on  chronic blood loss anemia   Encephalopathy acute   Gastritis and gastroduodenitis  Acute on chronic blood loss anemia GI bleed. History of gastric and small bowel AVMs Hgb 6.3 on admission>>2U PRBCs. 3U PRBCs total.  VSS hgb 7.6 this morning. Still have significant GI bleeding. Now also has nasal bleeding likely 2/2 trauma from NG Plan Transfuse 1U PRBC with 2h post transfusion h/h. Continue to trend h/h. Transfuse for hgb <7 protonix 49m daily Further management per GI recs  Acute encephalopathy. Etiology still remains unclear.  No sign of infection. BC/UC neg. Brain imaging neg for acute findings. UDS neg. TSH checked one month ago--3.9. Vit B1 pending. EEG neg for epileptiform patterns. Other consideration would be an LP if there is concern for infectious etiology or NPH however I think these are much less likely. Another MRI may help r/o a new stroke.   Liver fibrosis as suggested on 10/10 abd [place] Hx of HCV that was reportedly treated per husband. Care everywhere shows neg RNA as of 12/2014.  Not much change in ammonia since yesterday probably due to NG difficulties. 91>>90 No significant neurological changes on exam since yesterday Plan Will continue trending ammonia until normal. Lactulose enemas q4h. Further management per neuro recs  HFrEF. TEE in October significant for EF of 50-55%. Normal systolic function. Moderate dilation of LA, RA. Moderate MVR and stenosis. Moderate TVR. Plan: tele monitor. Daily weights.  Strict I/O.  Respiratory Alkalosis. Will repeat ABG given need for nasal packing now.  HTN: stable on admission. Continue coreg. DM II. Stable. Continue SSI Acute on CKD. Baseline appears to be around 1.7-2.3. 2.25 today so likely back to baseline.  Best practice:  CODE STATUS:Full Diet: NPO.  GI prophylaxis: protonix 463m DVT for prophylaxis: hold for active bleeding. SCDs Dispo: resides at skilled facility. Transferred to progressive level of care as she is  requiring close supervision   RYMitzi HansenMD INCarawayGY-1 PAGER #: [phone]/07/20 5:37 AM

## 2018-11-24 NOTE — Progress Notes (Addendum)
Pt seen at the bedside after being paged that pt had a single episode of bloody emesis. When we entered the room, we saw blood in the NG tube, as well as blood dripping from her nose. Bright red blood mixed with clots were also all over the pt's mouth, chest, and bed sheets. Earlier in the night, the pt had her NG tube replaced because it was kinked in her mouth. Shortly thereafter, she started to have a nose bleed and progressed to the episode of emesis. I think this is likely due to injury to her oro/nasopharynx secondary to the NG tube replacement. We will discontinue the NG tube for now. Tube was removed and Dr. Shan Levans applied pressure with gauze for about 10 minutes. The patient's nose was hemostatic before we left. Vital signs are reassuring. BP systolics in the 891Q with HR in 80s. We start with order 1U pRBC and check CBC with the morning labs. She may need a second unit of blood pending the results of the labs.  Earlene Plater, MD Internal Medicine, PGY1 Pager: [phone]  11/24/2018,4:00 AM

## 2018-11-24 NOTE — Progress Notes (Signed)
Per blood bank a protocol was put in place by Dr. Saralyn Pilar that if there is a shortage of blood they would hold releasing the transfusion ordered if hemoglobin is >7.0.   A CBC was drawn on patient after last transfusion which resulted in Hgb 8.2 therefore last remaining unit of PRBC is now on hold and was not administered to patient.

## 2018-11-24 NOTE — Progress Notes (Signed)
Patient bleeding BRB with clots from nose and mouth bleeding.  Coughing and gurgling. Using oral suctioning on patient. Gauze under nose saturated.  RR 10. Dr. Curly Rim and rapid response notified.

## 2018-11-24 NOTE — Progress Notes (Signed)
Patient blood was started at 8:28am and patient was transferred to 5W31.

## 2018-11-24 NOTE — Procedures (Signed)
Preop diagnosis: Right posterior epistaxis Postop diagnosis: same Procedure: Right posterior nasal packing Surgeon: Redmond Baseman Anesth: None Compl: None Findings: Posterior inferior turbinate source of active bleeding. Description: The right nasal passage was carefully examined using a nasal speculum and headlight.  The nasal passage was suctioned of clot.  Active bleeding was seen on the right inferior turbinate posteriorly.  A 10 cm Merocel pack was coated with Bacitracin ointment and then placed in the right nasal passage.  The pack was saturated with Afrin spray.  Bleeding was immediately controlled.  She was returned to nursing care in stable condition.

## 2018-11-24 NOTE — Progress Notes (Signed)
Daily Rounding Note  11/24/2018, 1:31 PM  LOS: 4 days   SUBJECTIVE:   Chief complaint: Chronic IDA.     Patient developed acute hematemesis this morning.  An NG tube had been placed late yesterday and overnight she developed acute epistaxis, NG tube was removed and staff performed nasal packing.  Underwent speculum exam with nasal packing of active bleeding right posterior, inferior turbinate.  ENT, Dr. Melida Quitter performed.  OBJECTIVE:         Vital signs in last 24 hours:    Temp:  [97.5 F (36.4 C)-98.8 F (37.1 C)] 98.1 F (36.7 C) (11/07 1148) Pulse Rate:  [79-96] 93 (11/07 1148) Resp:  [10-18] 13 (11/07 1148) BP: (142-174)/(62-94) 142/66 (11/07 1148) SpO2:  [94 %-98 %] 96 % (11/07 1148) Last BM Date: 11/23/18 There were no vitals filed for this visit. General: Patient looks acutely and chronically ill.  Her face is swollen.  She is not responding to voice or to exam though her eyes are wide open. There is dried blood on her lips.  Dried blood at the nares. Heart: RRR Chest: No labored breathing. Abdomen: Soft.  Obese.  Not tender or distended. Extremities: Lower extremity edema Neuro/Psych: Unresponsive to my exam.  I did not ply painful stimuli to see if she would respond.  I was not able to elicit asterixis.  Intake/Output from previous day: 11/06 0701 - 11/07 0700 In: 0  Out: 1200 [Urine:1200]  Intake/Output this shift: Total I/O In: 322 [Blood:322] Out: -   Lab Results: Recent Labs    11/23/18 1144 11/23/18 1628 11/24/18 0535  WBC 5.9 5.9 8.0  HGB 7.9* 8.2* 7.6*  HCT 26.6* 27.0* 25.8*  PLT 95* 105* 91*   BMET Recent Labs    11/22/18 0508 11/23/18 0709 11/24/18 0534  NA 148* 145 146*  K 3.8 3.7 3.8  CL 115* 115* 115*  CO2 22 20* 22  GLUCOSE 141* 156* 149*  BUN 57* 45* 40*  CREATININE 2.60* 2.44* 2.25*  CALCIUM 8.7* 8.6* 8.8*   LFT Recent Labs    11/22/18 0649 11/24/18 0534   PROT 7.5 7.5  ALBUMIN 2.5* 2.5*  AST 21 16  ALT 11 11  ALKPHOS 117 110  BILITOT 1.6* 1.6*  BILIDIR 0.5*  --   IBILI 1.1*  --    PT/INR Recent Labs    11/24/18 0542  LABPROT 16.6*  INR 1.4*   Hepatitis Panel No results for input(s): HEPBSAG, HCVAB, HEPAIGM, HEPBIGM in the last 72 hours.  Studies/Results: Dg Abd Portable 1v  Result Date: 11/23/2018 CLINICAL DATA:  NG tube placement EXAM: PORTABLE ABDOMEN - 1 VIEW COMPARISON:  November 23, 2018 FINDINGS: The enteric tube tip projects over the gastric body. There is a catheter that appears looped over the esophagus. This is not fully visualized on this study. The heart size is enlarged. IMPRESSION: 1. Enteric tube projects over the gastric body. 2. Second catheter or line projects over the esophagus and is only partially visualized. This may be external to the patient. Clinical correlation is recommended. Electronically Signed   By: Constance Holster M.D.   On: 11/23/2018 21:28   Dg Abd Portable 1v  Result Date: 11/23/2018 CLINICAL DATA:  Feeding tube placement EXAM: PORTABLE ABDOMEN - 1 VIEW COMPARISON:  10/31/2018 FINDINGS: The heart appears enlarged. Tip of the esophageal tube overlies the distal mediastinum. Upper gas pattern is unremarkable IMPRESSION: Tip of the esophageal tube overlies the distal  mediastinum. Further advancement by at least 15-20 cm is suggested, repeat radiograph for positioning would be advised after tube adjustment These results will be called to the ordering clinician or representative by the Radiologist Assistant, and communication documented in the PACS or zVision Dashboard. Electronically Signed   By: Donavan Foil M.D.   On: 11/23/2018 20:04   Scheduled Meds: . sodium chloride   Intravenous Once  . sodium chloride   Intravenous Once  . carvedilol  3.125 mg Oral BID WC  . insulin aspart  0-15 Units Subcutaneous TID WC  . insulin aspart  0-5 Units Subcutaneous QHS  . lactulose  300 mL Rectal Q4H  .  oxymetazoline  1 spray Each Nare BID  . pantoprazole (PROTONIX) IV  40 mg Intravenous Q24H  . sodium chloride  2 spray Each Nare Q4H   Continuous Infusions: . cefTRIAXone (ROCEPHIN)  IV 1 g (11/24/18 1311)   PRN Meds:.  ASSESMENT:   *    Chronic iron deficiency anemia. Maroon stools and/or hematochezia.. Several previous endoscopic evaluations of upper and lower GI tract 06/22/2018 small bowel enteroscopy.  Oozing hemorrhage from gastric ulcers treated with BiCAP.  Otherwise normal study to the jejunum. 08/18/2018 small bowel enteroscopy.  Erythema at greater curvature of stomach, otherwise normal study biopsy of this area 08/21/2018 colonoscopy.  Adequate prep, normal study. 10/30/18 video capsule endoscopy.  Red spots in the stomach, AVM in duodenum  11/22/2018 SB enteroscopy.  Subtle focal changes in the gastric fundus may be due to scope trauma, biopsied.  Normal duodenum, AVM identified on yesterday's capsule study was not seen.  Study into the jejunum was normal.  No evidence for esophageal or gastric varices or portal hypertensive gastropathy.  Gastric pathology showed focal, active, chronic nonspecific gastritis, fundic gland polyp, reactive gastropathy with subepithelial crystalline iron deposits consistent with iron pill gastropathy, no H. pylori Hgb relatively stable.  BID IV Protonix in place.   Has received a total of 4 PRBCs thus far, 2 of them today.  *     Acute hematemesis early this morning due to epistaxis.  Active bleeding in right turbinate, ENT physician packed this this morning.  *     Cirrhosis, HCV.    *     Hepatic vs toxic metabolic encephalopathy. Ammonia level 108 >> 90. Seething high-dose lactulos*    Thrombocytopenia, chronic.  *     CKD.  PLAN   *    No plans for further GI endoscopy/colonoscopy or imaging.  Will sign off.  *   Outpt GI fup w Dr Alice Reichert of [place] in [employer].    *   Administration of meds is going to be challenging given  that it looks is she may have had NG tube related trauma which caused the epistaxis.  Currently her mental status is not going to allow for oral administration of meds.    *    Does patient need palliative care, goals of care consult?       Azucena Freed  11/24/2018, 1:31 PM Phone [phone]

## 2018-11-24 NOTE — Significant Event (Signed)
Rapid Response Event Note  Overview: Follow Up - Bleeding from Nose - Concern for Airway Protection  Initial Focused Assessment: Received a call from nursing still concerns of patient having more bleeding from the nose coupled gurgling, and patient's inabilty to cough up blood/secretions. Per nurse, VSS and she was in the process of paging the primary service provider. Upon arrival, I could hear the patient gurgling from the doorway, I carefully suctioned her mouth with a flexible suction catheter, bright blood was suctioned from the back of the oropharynx. VSS, RR 10-14 at times, GCS 9, mental status remains much the same overall, opens eyes spontaneously, does not follow commands, + spontaneous cough, + gag. Nasal packing completely saturated in blood and there is blood draining around the packing as well.    Interventions: -- Nasal packing removed, Afrin spray applied, and new packing placed by IMTS MDs and myself   Plan of Care:  -- Monitor VS and Respiratory status -- Aspiration Precautions  -- Suction at bedside -- High risk for aspiration and needing intubation giving mental status, but currently protecting her airway.  -- ENT and GI to see patient  Event Summary:  Start Time 1013 Arrival Time 1016 End Time [name], [name]

## 2018-11-24 NOTE — Significant Event (Signed)
Rapid Response Event Note  Overview:Called d/t pt vomiting bright red blood. Pt here with GI bleed/AMS. Upper endo done yesterday revealed no source of bleeding and AMS thought to be d/t ammonia level and NGT placed for lactulose. Time Called: 0332 Arrival Time: 0335 Event Type: Other (Comment)(vomiting bright red blood)  Initial Focused Assessment: Pt laying in bed, moaning. Pt grimaces to painful stimuli(this is not new per chart)but will not follow commands or move purposely. Pt has bright red blood with clots on blanket and coming out of NGT. NGT was placed yesterday (11/6). Once placed, pt began having a nose bleed. TS MDs to bedside, orders to d/c ngt d/t nose bleed.   Interventions: NGT d/c'd by Dr. Shan Levans and pressure held x 10 minutes HGB-8.2> 1 unit PRBCs ordered CBC in the AM Plan of Care (if not transferred): Give blood. Continue to monitor pt closely. Call RRT if further assistance needed. Event Summary: Name of Physician Notified: Dr. Shan Levans at [phone]    at    Outcome: Stayed in room and stabalized     [place], Carren Rang

## 2018-11-24 NOTE — Progress Notes (Addendum)
NEURO HOSPITALIST PROGRESS NOTE   Subjective: Patient in bed, NAD, eyes open today. Transferred to 5W. Ammonia level is 91 today. Trending down.  Patient currently receiving blood.  Had bowel movement with bright red blood per rectum. Removed NG tube due to bleeding from nose.  Exam: Vitals:   11/24/18 0827 11/24/18 0844  BP: (!) 147/64 (!) 151/70  Pulse: 94 89  Resp: 14 14  Temp: 98.8 F (37.1 C) 98.3 F (36.8 C)  SpO2: 97% 97%    Physical Exam  Constitutional: Appears well-developed and well-nourished.  Eyes: Normal external eye and conjunctiva. HENT: Normocephalic, no lesions, without obvious abnormality.   Musculoskeletal-no joint tenderness, deformity or swelling Cardiovascular: Normal rate and regular rhythm.  Respiratory: Effort normal, non-labored breathing saturations WNL GI: Soft.  No distension. There is no tenderness.  Skin: WDI   Neuro:  Mental Status:  eyes open does not respond to voice. Patient grimaces and says "ow" to noxious but does not withdraw. Cranial Nerves: Right pupil is 49m non-reactive with cataract noted, left pupil is pinpoint face appears symmetric.  Motor/Sensory:  Patient able to say "ow" to noxious, but does not withdraw to noxious stimuli.  Cerebellar: UTA Gait: deferred    Medications:  Scheduled: . sodium chloride   Intravenous Once  . sodium chloride   Intravenous Once  . carvedilol  3.125 mg Oral BID WC  . insulin aspart  0-15 Units Subcutaneous TID WC  . insulin aspart  0-5 Units Subcutaneous QHS  . lactulose  300 mL Rectal Q4H  . pantoprazole (PROTONIX) IV  40 mg Intravenous Q24H   Continuous:  PRN:  Pertinent Labs/Diagnostics: 11/24/2018 Ammonia 90 Bun 40 Creatinine 2.25  rEEG 11/23/2018 IMPRESSION: This study is suggestive of moderate to severe diffuse encephalopathy, possibly but not limited to toxic metabolic etiology. No seizures or epileptiform discharges were seen throughout the  recording.  Dg Abd Portable 1v  Result Date: 11/23/2018 CLINICAL DATA:  NG tube placement EXAM: PORTABLE ABDOMEN - 1 VIEW COMPARISON:  November 23, 2018 FINDINGS: The enteric tube tip projects over the gastric body. There is a catheter that appears looped over the esophagus. This is not fully visualized on this study. The heart size is enlarged. IMPRESSION: 1. Enteric tube projects over the gastric body. 2. Second catheter or line projects over the esophagus and is only partially visualized. This may be external to the patient. Clinical correlation is recommended. Electronically Signed   By: CConstance HolsterM.D.   On: 11/23/2018 21:28   Dg Abd Portable 1v  Result Date: 11/23/2018 CLINICAL DATA:  Feeding tube placement EXAM: PORTABLE ABDOMEN - 1 VIEW COMPARISON:  10/31/2018 FINDINGS: The heart appears enlarged. Tip of the esophageal tube overlies the distal mediastinum. Upper gas pattern is unremarkable IMPRESSION: Tip of the esophageal tube overlies the distal mediastinum. Further advancement by at least 15-20 cm is suggested, repeat radiograph for positioning would be advised after tube adjustment These results will be called to the ordering clinician or representative by the Radiologist Assistant, and communication documented in the PACS or zVision Dashboard. Electronically Signed   By: KDonavan FoilM.D.   On: 11/23/2018 20:04    Assessment: 63year old female presented to hospital altered mental status. Currently patient is extremely encephalopathic possibly secondary to hyperammonemia along with acute anemia and other comorbidities. No asterixis noted today on exam.  Opens eyes to voice and  is slightly more awake today but remains encephalopathic.  Imaging not suggestive of an acute stroke or other acute abnormalities.. She has had multiple blood transfusion for GI bleed since October, and is currently also receiving transfusion for a possible GI bleed.  GI work-up thus far has been  unrevealing of an active bleeding source. Routine EEG did not show any epileptiform discharges or seizures.  Ammonia level trending down.  Took her NG tube out due to nasal bleeding, might be hard to do lactulose.  Would await primary team rounding and recommendations.  Impression: -Encephalopathy-hepatic versus toxic metabolic -Deconditioning due to recent hospital admissions, GI bleed and current comorbidities that include CHF with exacerbations, chronic anemia, COPD and diabetes.  Recommendations:  --Continue lactulose every 2 hours until ammonia levels normalize-discussed with GI as well. --Consider rifaximin in addition to the lactulose --daily AM ammonia levels until normalized   -- Laurey Morale, MSN, NP-C [facility] [phone]  Attending neurologist's note to follow  Attending Neurohospitalist Addendum Patient seen and examined with APP/Resident. Agree with the history and physical as documented above. Agree with the plan as documented, which I helped formulate. I have independently reviewed the chart, obtained history, review of systems and examined the patient.I have personally reviewed pertinent head/neck/spine imaging (CT/MRI). Discussed with Dr. Heber [place] and the team. Please feel free to call with any questions. --- Amie Portland, MD [facility] Pager: [phone]  If 7pm to 7am, please call on call as listed on AMION.

## 2018-11-25 LAB — BASIC METABOLIC PANEL
Anion gap: 10 (ref 5–15)
BUN: 39 mg/dL — ABNORMAL HIGH (ref 8–23)
CO2: 21 mmol/L — ABNORMAL LOW (ref 22–32)
Calcium: 8.6 mg/dL — ABNORMAL LOW (ref 8.9–10.3)
Chloride: 119 mmol/L — ABNORMAL HIGH (ref 98–111)
Creatinine, Ser: 1.97 mg/dL — ABNORMAL HIGH (ref 0.44–1.00)
GFR calc Af Amer: 30 mL/min — ABNORMAL LOW (ref >60)
GFR calc non Af Amer: 26 mL/min — ABNORMAL LOW (ref >60)
Glucose, Bld: 153 mg/dL — ABNORMAL HIGH (ref 70–99)
Potassium: 3.8 mmol/L (ref 3.5–5.1)
Sodium: 150 mmol/L — ABNORMAL HIGH (ref 135–145)

## 2018-11-25 LAB — CULTURE, BLOOD (ROUTINE X 2)
Culture: NO GROWTH
Culture: NO GROWTH
Special Requests: ADEQUATE
Special Requests: ADEQUATE

## 2018-11-25 LAB — AMMONIA: Ammonia: 69 umol/L — ABNORMAL HIGH (ref 9–35)

## 2018-11-25 LAB — CBC
HCT: 26 % — ABNORMAL LOW (ref 36.0–46.0)
Hemoglobin: 7.7 g/dL — ABNORMAL LOW (ref 12.0–15.0)
MCH: 27.4 pg (ref 26.0–34.0)
MCHC: 29.6 g/dL — ABNORMAL LOW (ref 30.0–36.0)
MCV: 92.5 fL (ref 80.0–100.0)
Platelets: 78 10*3/uL — ABNORMAL LOW (ref 150–400)
RBC: 2.81 MIL/uL — ABNORMAL LOW (ref 3.87–5.11)
RDW: 22.1 % — ABNORMAL HIGH (ref 11.5–15.5)
WBC: 8.5 10*3/uL (ref 4.0–10.5)
nRBC: 0.7 % — ABNORMAL HIGH (ref 0.0–0.2)

## 2018-11-25 LAB — GLUCOSE, CAPILLARY
Glucose-Capillary: 144 mg/dL — ABNORMAL HIGH (ref 70–99)
Glucose-Capillary: 157 mg/dL — ABNORMAL HIGH (ref 70–99)
Glucose-Capillary: 161 mg/dL — ABNORMAL HIGH (ref 70–99)
Glucose-Capillary: 171 mg/dL — ABNORMAL HIGH (ref 70–99)

## 2018-11-25 MED ORDER — DEXTROSE 5 % IV SOLN
INTRAVENOUS | Status: AC
  Administered 2018-11-25 – 2018-11-26 (×3): via INTRAVENOUS

## 2018-11-25 MED ORDER — LACTULOSE ENEMA
300.0000 mL | ORAL | Status: AC
  Administered 2018-11-25 (×2): 300 mL via RECTAL
  Administered 2018-11-25: 500 mL via RECTAL
  Administered 2018-11-25: 300 mL via RECTAL
  Filled 2018-11-25 (×4): qty 300

## 2018-11-25 NOTE — Progress Notes (Signed)
Daily Rounding Note  11/25/2018, 12:06 PM  LOS: 5 days   SUBJECTIVE:   Chief complaint:     Around noon today nurse inserted lactulose retention enema.  Expelled the balloon and clots of red blood emerged with this.  OBJECTIVE:         Vital signs in last 24 hours:    Temp:  [97.9 F (36.6 C)-99.1 F (37.3 C)] 97.9 F (36.6 C) (11/08 0759) Pulse Rate:  [90-99] 99 (11/08 0929) Resp:  [12-18] 12 (11/08 0929) BP: (113-167)/(56-90) 167/90 (11/08 0929) SpO2:  [97 %-100 %] 100 % (11/08 0929) Last BM Date: 11/24/18(Chronulac enema) There were no vitals filed for this visit. General: Still looks significantly ill but is more alert today and responding appropriately. Nasal: Nasal packing remains in place on the right naris.  There is no blood coming out from the nose Heart: RRR. Chest: Clear bilaterally in front.  No labored breathing or cough. Abdomen: Soft, obese, nontender.  Active bowel sounds Extremities: Lower extremity edema. Neuro/Psych: Following commands.  Answering questions appropriately but speaking just a few words at a time.  Intake/Output from previous day: 11/07 0701 - 11/08 0700 In: 322 [Blood:322] Out: 250 [Urine:250]  Intake/Output this shift: No intake/output data recorded.  Lab Results: Recent Labs    11/24/18 1337 11/24/18 2119 11/25/18 0518  WBC 10.5 9.1 8.5  HGB 6.8* 8.2* 7.7*  HCT 22.2* 27.3* 26.0*  PLT 84* 83* 78*   BMET Recent Labs    11/23/18 0709 11/24/18 0534 11/25/18 0518  NA 145 146* 150*  K 3.7 3.8 3.8  CL 115* 115* 119*  CO2 20* 22 21*  GLUCOSE 156* 149* 153*  BUN 45* 40* 39*  CREATININE 2.44* 2.25* 1.97*  CALCIUM 8.6* 8.8* 8.6*   LFT Recent Labs    11/24/18 0534  PROT 7.5  ALBUMIN 2.5*  AST 16  ALT 11  ALKPHOS 110  BILITOT 1.6*   PT/INR Recent Labs    11/24/18 0542  LABPROT 16.6*  INR 1.4*   Hepatitis Panel No results for input(s): HEPBSAG, HCVAB,  HEPAIGM, HEPBIGM in the last 72 hours.  Studies/Results: Dg Abd Portable 1v  Result Date: 11/23/2018 CLINICAL DATA:  NG tube placement EXAM: PORTABLE ABDOMEN - 1 VIEW COMPARISON:  November 23, 2018 FINDINGS: The enteric tube tip projects over the gastric body. There is a catheter that appears looped over the esophagus. This is not fully visualized on this study. The heart size is enlarged. IMPRESSION: 1. Enteric tube projects over the gastric body. 2. Second catheter or line projects over the esophagus and is only partially visualized. This may be external to the patient. Clinical correlation is recommended. Electronically Signed   By: Constance Holster M.D.   On: 11/23/2018 21:28   Dg Abd Portable 1v  Result Date: 11/23/2018 CLINICAL DATA:  Feeding tube placement EXAM: PORTABLE ABDOMEN - 1 VIEW COMPARISON:  10/31/2018 FINDINGS: The heart appears enlarged. Tip of the esophageal tube overlies the distal mediastinum. Upper gas pattern is unremarkable IMPRESSION: Tip of the esophageal tube overlies the distal mediastinum. Further advancement by at least 15-20 cm is suggested, repeat radiograph for positioning would be advised after tube adjustment These results will be called to the ordering clinician or representative by the Radiologist Assistant, and communication documented in the PACS or zVision Dashboard. Electronically Signed   By: Donavan Foil M.D.   On: 11/23/2018 20:04   Scheduled Meds: . sodium chloride  Intravenous Once  . sodium chloride   Intravenous Once  . carvedilol  3.125 mg Oral BID WC  . insulin aspart  0-15 Units Subcutaneous TID WC  . insulin aspart  0-5 Units Subcutaneous QHS  . lactulose  300 mL Rectal Q4H  . oxymetazoline  1 spray Each Nare BID  . pantoprazole (PROTONIX) IV  40 mg Intravenous Q24H  . sodium chloride  2 spray Each Nare Q4H   Continuous Infusions: . cefTRIAXone (ROCEPHIN)  IV 1 g (11/24/18 1311)  . dextrose 125 mL/hr at 11/25/18 0917   PRN Meds:.   ASSESMENT:   *    Maroon stools and/or hematochezia.  Several previous endoscopic evaluations of upper and lower GI tract 06/22/2018 small bowel enteroscopy.  Oozing hemorrhage from gastric ulcers treated with BiCAP.  Otherwise normal study to the jejunum. 08/18/2018 small bowel enteroscopy.  Erythema at greater curvature of stomach, otherwise normal study biopsy of this area 08/21/2018 colonoscopy.  Adequate prep, normal study. 10/30/18 video capsule endoscopy.  Red spots in the stomach, AVM in duodenum  11/22/2018 SB enteroscopy.  Subtle focal changes in the gastric fundus may be due to scope trauma, biopsied.  Normal duodenum, AVM identified on yesterday's capsule study was not seen.  Study into the jejunum was normal.  No evidence for esophageal or gastric varices or portal hypertensive gastropathy.  Gastric pathology showed focal, active, chronic nonspecific gastritis, fundic gland polyp, reactive gastropathy with subepithelial crystalline iron deposits consistent with iron pill gastropathy, no H. pylori IV Protonix in place.     *     Acute hematemesis 11/7 in setting of epistaxis.  Active bleeding in right turbinate, ENT physician packed this this morning. Bleeding likely due to NGT trauma.    *    Acute on Chronic iron deficiency anemia.   5 PRBCs thus far, 3 latest on 11/7.  Hgb relatively stable.    *    Vaginal bleeding.  *     Cirrhosis, HCV.    *   Thrombocytopenia.  Not yet critical but steady decline.  *     Hepatic vs toxic metabolic encephalopathy.  MRI brain shows advanced chronic small vessel ischemic disease, widespread chronic lacunar infarcts Ammonia level 108 >> 90 >> 69. Seething high-dose lactulose  *    Thrombocytopenia, chronic.  *     CKD.  AKI, improved.   PLAN   *   Tagged RBC bleeding scan tmrw.  If done today, the radiology tech team would need to be called and and it would be several hours before it was completed.  After speaking with Dr. Tarri Glenn,  bleeding scan will be pursued tomorrow  *    Not convinced that this is hepatic encephalopathy so question is should we stop the lactulose retention enema was given the bleeding? Patient is still to neurologically impaired for oral intake and NG tube not an option given the epistaxis and nasal trauma from previous NG tube.      Azucena Freed  11/25/2018, 12:06 PM Phone [phone]

## 2018-11-25 NOTE — Progress Notes (Signed)
NAME:  [name], MRN:  [id number], DOB:  1955/06/21, LOS: 5 ADMISSION DATE:  11/20/2018,  Primary: [place]    Brief History  63 yo female who resides at a skilled nursing facility with PMH of small bowel AVMs, COPD, HFrEF, DM 2, chronic hepatitis C, HTN, HLD, and sleep apnea presented to ED for GI bleeding and altered mental status.  Hgb on admission 6.3 requiring transfusionx2. Previous caps endoscopy signficant for AVMs however when small bowel endoscopy was attempted, patient acutely decompensated. Repeat endoscopy on 11/5 did not reveal etiology of bleed despite continued GI bleeding. Patient also presented with acute encephalopathy. Husband and facility notes that patient was previously a/o. Etiology remains unclear however neuro is hopeful that it will improve with resolving hyperammonemia via lactulose.  Subjective  No overnight events VSS Objective   Blood pressure (!) 167/90, pulse 99, temperature 97.9 F (36.6 C), temperature source Oral, resp. rate 12, SpO2 100 %.     Intake/Output Summary (Last 24 hours) at 11/25/2018 1116 Last data filed at 11/25/2018 0917 Gross per 24 hour  Intake 0 ml  Output 250 ml  Net -250 ml    Examination: GENERAL: chronically ill appearing HEENT: head atraumatic. Cataract of right eye. Abnormal iris shape. rhinorocket to right nare CARDIAC: heart RRR. PULMONARY: mouth breathing due to nasal packing. Lung sounds clear ABDOMEN: soft. bs active NEURO: somewhat more alert this morning. Still not verbal. Did seem to track a little better with eyes. Not following commands. Asterixis seems improved SKIN: no rash  Consults:  GI Neurology Significant Diagnostic Tests:  11/3 CXR-unremarkable 11/3 CT head-no acute changes 11/4 MRI brain: no acute changes 11/5 EEG>>no epileptiform pattern 11/5 endoscopy>>no AVMs 11/5 surgical path>>  Micro Data:  11/3 blood culture>>NGTD 11/3 urine culture>>NGTD  Labs    CBC Latest  Ref Rng & Units 11/25/2018 11/24/2018 11/24/2018  WBC 4.0 - 10.5 K/uL 8.5 9.1 10.5  Hemoglobin 12.0 - 15.0 g/dL 7.7(L) 8.2(L) 6.8(LL)  Hematocrit 36.0 - 46.0 % 26.0(L) 27.3(L) 22.2(L)  Platelets 150 - 400 K/uL 78(L) 83(L) 84(L)   BMP Latest Ref Rng & Units 11/25/2018 11/24/2018 11/23/2018  Glucose 70 - 99 mg/dL 153(H) 149(H) 156(H)  BUN 8 - 23 mg/dL 39(H) 40(H) 45(H)  Creatinine 0.44 - 1.00 mg/dL 1.97(H) 2.25(H) 2.44(H)  Sodium 135 - 145 mmol/L 150(H) 146(H) 145  Potassium 3.5 - 5.1 mmol/L 3.8 3.8 3.7  Chloride 98 - 111 mmol/L 119(H) 115(H) 115(H)  CO2 22 - 32 mmol/L 21(L) 22 20(L)  Calcium 8.9 - 10.3 mg/dL 8.6(L) 8.8(L) 8.6(L)   Stool occult + UA neg; Procalc <0.1 UDS neg Ammonia 110>>101>>91>>90>>60  Summary  63 yo female with PMH of small bowel AVMs, COPD, HFrEF, DM 2, chronic hepatitis C, HTN, HLD, and sleep apnea presented to ED for GI bleeding and altered mental status.   Assessment & Plan:  Active Problems:   Acute on chronic blood loss anemia   Encephalopathy acute   Gastritis and gastroduodenitis  Acute on chronic blood loss anemia GI bleed. History of gastric and small bowel AVMs  Has required several transfusions since admission. Still no clear source of bleeding. Upper endoscopy neg for finding source of bleed. VSS hgb 7.6 this morning. Still have significant GI bleeding.  Nasal bleeding resolved with rhinorocket placement Plan Continue to trend h/h. Transfuse for hgb <7 protonix 23m daily Further management per GI recs  Posterior nasal bleed 2/2 NG trauma. Resolved with rhino rocket placement on 11/7 per ENT  Plan: rhinorocket to remain in place 3-4d. Rocephin while in place.  Acute encephalopathy. Seems to be slight improvement today. Etiology still remains unclear.  No sign of infection. BC/UC neg. Brain imaging neg for acute findings. UDS neg. TSH checked one month ago--3.9. Vit B1 pending. EEG neg for epileptiform patterns. Other consideration would be an LP if  there is concern for infectious etiology or NPH however I think these are much less likely. Another MRI may help r/o a new stroke.   Liver fibrosis as suggested on 10/10 abd [place]. Hx of HCV that was reportedly treated per husband. Care everywhere shows neg RNA as of 12/2014. Endoscopy report notes no findings consistent with portal hypertension or hepatic gastropathy. Ammonia level coming down nicely--60 today Platelets continue to drop--83. Plan Will continue trending ammonia until normal. Lactulose enemas q4h. Further management per neuro recs  Hypernatremia. Responded well to D5W previously. Will restart and repeat bmp in am.  HFrEF. TEE in October significant for EF of 50-55%. Normal systolic function. Moderate dilation of LA, RA. Moderate MVR and stenosis. Moderate TVR. Plan: tele monitor. Daily weights.  Strict I/O. HTN: stable on admission. Continue coreg. DM II. Stable. Continue SSI Acute on CKD. Baseline appears to be around 1.7-2.3. 2.25 today so likely back to baseline.  Best practice:  CODE STATUS:Full Diet: NPO.  GI prophylaxis: protonix 56m  DVT for prophylaxis: hold for active bleeding. SCDs Dispo: resides at skilled facility. Transferred to progressive level of care as she is requiring close supervision   RMitzi Hansen MD [id number] PAGER #: [phone]/08/20 11:16 AM

## 2018-11-25 NOTE — Progress Notes (Signed)
Pt was given 3 lactulose retention enemas today.  She tolerated an average of 529m per enema.  It was difficult to measure the output because it just trickled out and soaked towels and chucks placed beneath her.  Small amount of maroon viscous fluid came out each time (maybe 45 ml) and more watery maroon colored fluid came out ( possibly the enema with old blood in it).

## 2018-11-25 NOTE — Progress Notes (Signed)
NEURO HOSPITALIST PROGRESS NOTE   Subjective: Patient in bed, NAD, eyes open today.Ammonia level is 69 today. Trending down.  Patient able to follow some commands today. No nose bleeds overnight.   Exam: Vitals:   11/25/18 0759 11/25/18 0929  BP: 140/78 (!) 167/90  Pulse: 92 99  Resp: 13 12  Temp: 97.9 F (36.6 C)   SpO2: 99% 100%   Neurological examination:  Mental Status:  Patient eyes open. Attempts to verbalize. Able to respond yes or no. Speech sometimes incomprehensible. Able to follow simple commands today.  Very dysarthric.  Able to say her name. Cranial Nerves: Right pupil is 66m non-reactive with cataract noted, left pupil is pinpoint face appears symmetric.  Tongue midline.  Palate midline. Motor/Sensory: Able to move both upper extremities antigravity although 4/5 strength.  Both lower extremity-able to wiggle toes.  Did not appreciate any asterixis today. Cerebellar: UTA Gait: deferred    Medications:  Scheduled: . sodium chloride   Intravenous Once  . sodium chloride   Intravenous Once  . carvedilol  3.125 mg Oral BID WC  . insulin aspart  0-15 Units Subcutaneous TID WC  . insulin aspart  0-5 Units Subcutaneous QHS  . lactulose  300 mL Rectal Q4H  . oxymetazoline  1 spray Each Nare BID  . pantoprazole (PROTONIX) IV  40 mg Intravenous Q24H  . sodium chloride  2 spray Each Nare Q4H   Continuous: . cefTRIAXone (ROCEPHIN)  IV 1 g (11/24/18 1311)  . dextrose 125 mL/hr at 11/25/18 0917   PRN:  Pertinent Labs/Diagnostics: 11/25/2018 Ammonia 69 Bun 39 Creatinine 1.97  rEEG 11/23/2018 IMPRESSION: This study is suggestive of moderate to severe diffuse encephalopathy, possibly but not limited to toxic metabolic etiology. No seizures or epileptiform discharges were seen throughout the recording.  Dg Abd Portable 1v  Result Date: 11/23/2018 CLINICAL DATA:  NG tube placement EXAM: PORTABLE ABDOMEN - 1 VIEW COMPARISON:  November 23, 2018  FINDINGS: The enteric tube tip projects over the gastric body. There is a catheter that appears looped over the esophagus. This is not fully visualized on this study. The heart size is enlarged. IMPRESSION: 1. Enteric tube projects over the gastric body. 2. Second catheter or line projects over the esophagus and is only partially visualized. This may be external to the patient. Clinical correlation is recommended. Electronically Signed   By: CConstance HolsterM.D.   On: 11/23/2018 21:28   Dg Abd Portable 1v  Result Date: 11/23/2018 CLINICAL DATA:  Feeding tube placement EXAM: PORTABLE ABDOMEN - 1 VIEW COMPARISON:  10/31/2018 FINDINGS: The heart appears enlarged. Tip of the esophageal tube overlies the distal mediastinum. Upper gas pattern is unremarkable IMPRESSION: Tip of the esophageal tube overlies the distal mediastinum. Further advancement by at least 15-20 cm is suggested, repeat radiograph for positioning would be advised after tube adjustment These results will be called to the ordering clinician or representative by the Radiologist Assistant, and communication documented in the PACS or zVision Dashboard. Electronically Signed   By: KDonavan FoilM.D.   On: 11/23/2018 20:04    Assessment: 63year old female presented to hospital altered mental status. Currently patient is extremely encephalopathic possibly secondary to hyperammonemia along with acute anemia and other comorbidities. Ammonia level significantly improved from 2 days ago and exam also is considerably better and going in the right direction.  Impression: -Encephalopathy-likely hepatic versus toxic  metabolic -Deconditioning due to recent hospital admissions, GI bleed and current comorbidities that include CHF with exacerbations, chronic anemia, COPD and diabetes.  Recommendations:  --Continue lactulose every 2 hours. --Would recommend checking ammonia level again tomorrow.  Although ammonia might normalize with lactulose in a  day or so, clinical improvement lags laboratory improvement given the fact that it is not just the hyperammonemia causing her current presentation and it is a multifactorial encephalopathy. --Continue to correct toxic metabolic derangements as you are --Management of the GI bleed per gastroenterology team.  Plan was relayed to Dr. Darrick Meigs over the phone.  Neurology will be available as needed.  Please call with questions.   -- Laurey Morale, MSN, NP-C [facility] [phone]  Attending Neurohospitalist Addendum Patient seen and examined with APP/Resident. Agree with the history and physical as documented above. Agree with the plan as documented, which I helped formulate. I have independently reviewed the chart, obtained history, review of systems and examined the patient.I have personally reviewed pertinent head/neck/spine imaging (CT/MRI). Please feel free to call with any questions. --- Amie Portland, MD [facility] Pager: [phone]  If 7pm to 7am, please call on call as listed on AMION.

## 2018-11-25 NOTE — Progress Notes (Signed)
Paged internal medicine to discuss lactulose enemas.  When I inserted retention enema, 573m, pt pushed the balloon for the enema out and dark and bright red clots came out. I reported this to internal med md.

## 2018-11-25 NOTE — Progress Notes (Signed)
I called the radiology dept about stat NM test.  They said the team would have to be called in and they connected me with the radiologist who was reading scans today.  He said he does not call in teams.  I called [facility], they said to call the doctor who ordered the test and ask them to call radiology dept to call the NM team in.  I left a message with [facility] for Trula Slade to call radiology dept.  -Nemiah Commander, RN

## 2018-11-26 ENCOUNTER — Inpatient Hospital Stay (HOSPITAL_COMMUNITY): Payer: Medicaid Other

## 2018-11-26 LAB — CBC
HCT: 27.9 % — ABNORMAL LOW (ref 36.0–46.0)
Hemoglobin: 8.1 g/dL — ABNORMAL LOW (ref 12.0–15.0)
MCH: 28.4 pg (ref 26.0–34.0)
MCHC: 29 g/dL — ABNORMAL LOW (ref 30.0–36.0)
MCV: 97.9 fL (ref 80.0–100.0)
Platelets: 80 10*3/uL — ABNORMAL LOW (ref 150–400)
RBC: 2.85 MIL/uL — ABNORMAL LOW (ref 3.87–5.11)
RDW: 22.2 % — ABNORMAL HIGH (ref 11.5–15.5)
WBC: 7.9 10*3/uL (ref 4.0–10.5)
nRBC: 0.4 % — ABNORMAL HIGH (ref 0.0–0.2)

## 2018-11-26 LAB — BASIC METABOLIC PANEL
Anion gap: 10 (ref 5–15)
BUN: 32 mg/dL — ABNORMAL HIGH (ref 8–23)
CO2: 20 mmol/L — ABNORMAL LOW (ref 22–32)
Calcium: 8.7 mg/dL — ABNORMAL LOW (ref 8.9–10.3)
Chloride: 116 mmol/L — ABNORMAL HIGH (ref 98–111)
Creatinine, Ser: 1.61 mg/dL — ABNORMAL HIGH (ref 0.44–1.00)
GFR calc Af Amer: 39 mL/min — ABNORMAL LOW (ref >60)
GFR calc non Af Amer: 33 mL/min — ABNORMAL LOW (ref >60)
Glucose, Bld: 203 mg/dL — ABNORMAL HIGH (ref 70–99)
Potassium: 3.6 mmol/L (ref 3.5–5.1)
Sodium: 146 mmol/L — ABNORMAL HIGH (ref 135–145)

## 2018-11-26 LAB — GLUCOSE, CAPILLARY
Glucose-Capillary: 189 mg/dL — ABNORMAL HIGH (ref 70–99)
Glucose-Capillary: 121 mg/dL — ABNORMAL HIGH (ref 70–99)
Glucose-Capillary: 109 mg/dL — ABNORMAL HIGH (ref 70–99)

## 2018-11-26 LAB — APTT: aPTT: 32 seconds (ref 24–36)

## 2018-11-26 LAB — VITAMIN B1: Vitamin B1 (Thiamine): 81.8 nmol/L (ref 66.5–200.0)

## 2018-11-26 LAB — FIBRINOGEN: Fibrinogen: 367 mg/dL (ref 210–475)

## 2018-11-26 LAB — PROTIME-INR
INR: 1.3 — ABNORMAL HIGH (ref 0.8–1.2)
Prothrombin Time: 15.7 seconds — ABNORMAL HIGH (ref 11.4–15.2)

## 2018-11-26 LAB — SAVE SMEAR(SSMR), FOR PROVIDER SLIDE REVIEW

## 2018-11-26 LAB — AMMONIA: Ammonia: 51 umol/L — ABNORMAL HIGH (ref 9–35)

## 2018-11-26 MED ORDER — TECHNETIUM TC 99M-LABELED RED BLOOD CELLS IV KIT
25.0000 | PACK | Freq: Once | INTRAVENOUS | Status: AC | PRN
  Administered 2018-11-26: 15:00:00 25 via INTRAVENOUS

## 2018-11-26 MED ORDER — DEXTROSE 5 % IV SOLN
INTRAVENOUS | Status: AC
  Administered 2018-11-26: 09:00:00 via INTRAVENOUS

## 2018-11-26 MED ORDER — LACTULOSE ENEMA
300.0000 mL | ORAL | Status: AC
  Administered 2018-11-26 (×2): 300 mL via RECTAL
  Filled 2018-11-26 (×4): qty 300

## 2018-11-26 NOTE — NC FL2 (Signed)
[place] LEVEL OF CARE SCREENING TOOL     IDENTIFICATION  Patient Name: [name] Birthdate: [birth year] Sex: female Admission Date (Current Location): 11/20/2018  [facility] and [place] Number:  Herbalist and Address:  [facility]. [facility], [place] [address], [place], [place] [address]      Provider Number: [id number]  Attending Physician Name and Address:  Lucious Groves, DO  Relative Name and Phone Number:       Current Level of Care: Hospital Recommended Level of Care: [place] Prior Approval Number:    Date Approved/Denied:   PASRR Number: [id number]  Discharge Plan: SNF    Current Diagnoses: Patient Active Problem List   Diagnosis Date Noted  . Gastritis and gastroduodenitis   . Encephalopathy acute   . Acute on chronic blood loss anemia 11/20/2018  . Pressure injury of skin 10/26/2018  . Anxiety 08/15/2018  . Chronic back pain 08/15/2018  . GERD (gastroesophageal reflux disease) 08/15/2018  . History of carpal tunnel syndrome 08/15/2018  . HNP (herniated nucleus pulposus), lumbar 08/15/2018  . Hyperlipidemia 08/15/2018  . Hypertension 08/15/2018  . Insomnia 08/15/2018  . Left ventricular hypertrophy 08/15/2018  . Obesity 08/15/2018  . Sleep apnea 08/15/2018  . Severe anemia 06/20/2018  . Diabetic polyneuropathy associated with type 2 diabetes mellitus ([place]) 02/23/2018  . Moderate tricuspid regurgitation 02/18/2018  . Acute on chronic diastolic CHF (congestive heart failure) ([place]) 02/08/2018  . 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 Depression 02/05/2018  . Generalized abdominal pain 02/05/2018  . CHF (congestive heart failure) ([place]) 01/13/2018  . B12 deficiency 12/07/2017  . Iron deficiency anemia due to chronic blood loss 12/07/2017  . Folate deficiency 12/07/2017  . GIB (gastrointestinal bleeding) 11/03/2017  . COPD (chronic obstructive pulmonary disease) ([place]) 09/26/2017  .  Symptomatic anemia 09/26/2017  . Acute on chronic systolic CHF (congestive heart failure) ([place]) 08/27/2017  . Anemia   . Weakness   . GI bleed 08/07/2017  . Chronic systolic heart failure ([place]) 12/28/2016  . Diabetes ([place]) 12/28/2016  . Tobacco use 12/28/2016  . NSTEMI (non-ST elevated myocardial infarction) ([place])   . Sepsis ([place]) 12/18/2016  . CHF exacerbation ([place]) 12/04/2015  . PNA (pneumonia) 10/28/2015  . COPD exacerbation ([place]) 08/29/2015  . Chronic hepatitis C without hepatic coma ([place]) 05/21/2014  . CAD (coronary artery disease) 07/19/2013  . Right arm pain 06/26/2013  . Cervical pain (neck) 03/28/2013  . Radiculopathy affecting upper extremity 03/28/2013  . Impaired renal function 12/21/2012  . Abnormal mammogram 12/20/2012  . Diabetes mellitus type 2, uncontrolled ([place]) 12/20/2012    Orientation RESPIRATION BLADDER Height & Weight     Self, Time, Situation, Place  Normal Incontinent, External catheter Weight:   Height:     BEHAVIORAL SYMPTOMS/MOOD NEUROLOGICAL BOWEL NUTRITION STATUS      Continent Diet(see discharge summary)  AMBULATORY STATUS COMMUNICATION OF NEEDS Skin   Extensive Assist Verbally Other (Comment), Skin abrasions(blister on right foot; cracking on bilateral feet; MASD on groin/abdomen/breasts; rash on back)                       Personal Care Assistance Level of Assistance  Bathing, Dressing, Feeding Bathing Assistance: Maximum assistance Feeding assistance: Limited assistance Dressing Assistance: Maximum assistance Total Care Assistance: Maximum assistance   Functional Limitations Info  Sight, Speech, Hearing Sight Info: Adequate Hearing Info: Adequate Speech Info: Impaired    SPECIAL CARE FACTORS FREQUENCY  OT (By licensed OT),  PT (By licensed PT)     PT Frequency: 5x week OT Frequency: 5x week            Contractures Contractures Info: Not present    Additional Factors Info  Code Status, Allergies, Insulin Sliding Scale,  Isolation Precautions Code Status Info: Full Code Allergies Info: Latex, Gabapentin   Insulin Sliding Scale Info: insulin aspart (novoLOG) injection 0-15 Units 3x daily with meals; insulin aspart (novoLOG) injection 0-5 Units daily at bedtime Isolation Precautions Info: MRSA     Current Medications (11/26/2018):  This is the current hospital active medication list Current Facility-Administered Medications  Medication Dose Route Frequency Provider Last Rate Last Dose  . 0.9 %  sodium chloride infusion (Manually program via Guardrails IV Fluids)   Intravenous Once [place], Carley D, DO      . 0.9 %  sodium chloride infusion (Manually program via Guardrails IV Fluids)   Intravenous Once Katherine Roan, MD      . carvedilol (COREG) tablet 3.125 mg  3.125 mg Oral BID WC [place], Carley D, DO   3.125 mg at 11/24/18 0000  . cefTRIAXone (ROCEPHIN) 1 g in sodium chloride 0.9 % 100 mL IVPB  1 g Intravenous Q24H Christian, [name], MD 200 mL/hr at 11/25/18 1241 1 g at 11/25/18 1241  . dextrose 5 % solution   Intravenous Continuous [place], Nila Nephew D, DO 150 mL/hr at 11/26/18 0903    . insulin aspart (novoLOG) injection 0-15 Units  0-15 Units Subcutaneous TID WC [place], Carley D, DO   3 Units at 11/26/18 0915  . insulin aspart (novoLOG) injection 0-5 Units  0-5 Units Subcutaneous QHS [place], Carley D, DO      . lactulose (CHRONULAC) enema 200 gm  300 mL Rectal Q4H [place], Carley D, DO      . oxymetazoline (AFRIN) 0.05 % nasal spray 1 spray  1 spray Each Nare BID Mitzi Hansen, MD   1 spray at 11/25/18 2333  . pantoprazole (PROTONIX) injection 40 mg  40 mg Intravenous Q24H Willia Craze, NP   40 mg at 11/26/18 0916  . sodium chloride (OCEAN) 0.65 % nasal spray 2 spray  2 spray Each Nare Q4H Melida Quitter, MD   2 spray at 11/26/18 4696     Discharge Medications: Please see discharge summary for a list of discharge medications.  Relevant Imaging Results:  Relevant Lab  Results:   Additional Information SS# [place], [place]

## 2018-11-26 NOTE — Progress Notes (Signed)
Pt going to [place].  Will give lactulose enema and afrin when she returns.

## 2018-11-26 NOTE — Progress Notes (Signed)
NAME:  [name], MRN:  [id number], DOB:  1955/08/26, LOS: 6 ADMISSION DATE:  11/20/2018,  Primary: [place]    Brief History  63 yo female who resides at a skilled nursing facility with PMH of small bowel AVMs, COPD, HFrEF, DM 2, chronic hepatitis C, HTN, HLD, and sleep apnea presented to ED for GI bleeding and altered mental status.  Hgb on admission 6.3 requiring transfusionx2. Previous caps endoscopy signficant for AVMs however when small bowel endoscopy was attempted, patient acutely decompensated. Repeat endoscopy on 11/5 did not reveal etiology of bleed despite continued GI bleeding. Patient also presented with acute encephalopathy. Husband and facility notes that patient was previously a/o. Etiology remains unclear however neuro is hopeful that it will improve with resolving hyperammonemia via lactulose.  Subjective  No overnight events VSS Significant neurologic improvement since yesterday. Was answering questions and following commands. Objective   Blood pressure (!) 171/92, pulse 85, temperature (!) 97.5 F (36.4 C), temperature source Axillary, resp. rate 11, SpO2 100 %.     Intake/Output Summary (Last 24 hours) at 11/26/2018 1043 Last data filed at 11/26/2018 0903 Gross per 24 hour  Intake 2395.71 ml  Output 1000 ml  Net 1395.71 ml    Examination: GENERAL: chronically ill appearing HEENT: head atraumatic. Cataract of right eye. Abnormal iris shape. rhinorocket to right nare CARDIAC: heart RRR. PULMONARY: mouth breathing due to nasal packing. Lung sounds clear ABDOMEN: soft. bs active NEURO: much improved from yesterday. Alert and oriented x3. Following commands. Face symmetric. Upper extremity 2/5 motor strength bilaterally. SKIN: no rash  Consults:  GI Neurology Significant Diagnostic Tests:  11/3 CXR-unremarkable 11/3 CT head-no acute changes 11/4 MRI brain: no acute changes 11/5 EEG>>no epileptiform pattern 11/5 endoscopy>>no AVMs 11/5  surgical path>>  Micro Data:  11/3 blood culture>>NGTD 11/3 urine culture>>NGTD  Labs    CBC Latest Ref Rng & Units 11/26/2018 11/25/2018 11/24/2018  WBC 4.0 - 10.5 K/uL 7.9 8.5 9.1  Hemoglobin 12.0 - 15.0 g/dL 8.1(L) 7.7(L) 8.2(L)  Hematocrit 36.0 - 46.0 % 27.9(L) 26.0(L) 27.3(L)  Platelets 150 - 400 K/uL 80(L) 78(L) 83(L)   BMP Latest Ref Rng & Units 11/26/2018 11/25/2018 11/24/2018  Glucose 70 - 99 mg/dL 203(H) 153(H) 149(H)  BUN 8 - 23 mg/dL 32(H) 39(H) 40(H)  Creatinine 0.44 - 1.00 mg/dL 1.61(H) 1.97(H) 2.25(H)  Sodium 135 - 145 mmol/L 146(H) 150(H) 146(H)  Potassium 3.5 - 5.1 mmol/L 3.6 3.8 3.8  Chloride 98 - 111 mmol/L 116(H) 119(H) 115(H)  CO2 22 - 32 mmol/L 20(L) 21(L) 22  Calcium 8.9 - 10.3 mg/dL 8.7(L) 8.6(L) 8.8(L)   Stool occult + UA neg; Procalc <0.1 UDS neg Ammonia 110>>101>>91>>90>>69>>51 Fibrinogen wnl INR 1.5>>1.4>>1.3 PTT 32 (wnl) Summary  63 yo female with PMH of small bowel AVMs, COPD, HFrEF, DM 2, chronic hepatitis C, HTN, HLD, and sleep apnea presented to ED for GI bleeding and altered mental status.   Assessment & Plan:  Active Problems:   Acute on chronic blood loss anemia   Encephalopathy acute   Gastritis and gastroduodenitis  Acute on chronic blood loss anemia GI bleed. History of gastric and small bowel AVMs  Has required several transfusions since admission. Still no clear source of bleeding. Upper endoscopy neg for finding source of bleed. VSS hgb stable overnight--8.1 this morning INR slowly coming down. PTT normal. Fibrinogen normal. Save smear completed. Mixing studies pending. Plan Continue to trend h/h. Transfuse for hgb <7 protonix 22m daily Further management per GI recs  Posterior nasal bleed 2/2 NG trauma. Resolved with rhino rocket placement on 11/7 per ENT Plan: rhinorocket to remain in place 3-4d. Rocephin while in place.  Acute encephalopathy. Significant improvement from yesterday. Given her mental status improvement with  down trending ammonia levels, could attribute encephalopathy to hyperammonemia.   Liver fibrosis as suggested on 10/10 abd [place]. Hx of HCV that was reportedly treated per husband. Care everywhere shows neg RNA as of 12/2014. Endoscopy report notes no findings consistent with portal hypertension or hepatic gastropathy. Ammonia level continuing to trend down Thrombocytopenia--stable from yesterday Plan Will continue trending ammonia until normal. Lactulose enemas q4h. If able to pass swallow eval, can transition to po lactulose. ST eval Will need outpatient follow up for liver eval  Hypernatremia. Improving. Continue D5W  HFrEF. TEE in October significant for EF of 50-55%. Normal systolic function. Moderate dilation of LA, RA. Moderate MVR and stenosis. Moderate TVR. Plan: tele monitor. Daily weights.  Strict I/O. HTN: stable on admission. Continue coreg. DM II. Stable. Continue SSI Acute on CKD. Continuing to improve.   Best practice:  CODE STATUS:Full Diet: NPO.  GI prophylaxis: protonix 46m  DVT for prophylaxis: hold for active bleeding. SCDs Dispo: resides at skilled facility.    RMitzi Hansen MD INTERNAL MEDICINE RESIDENT PGY-1 PAGER #: [phone]/09/20 10:43 AM

## 2018-11-26 NOTE — Progress Notes (Addendum)
Daily Rounding Note  11/26/2018, 4:11 PM  LOS: 6 days   SUBJECTIVE:   Chief complaint: painless hematochezia.    No bleeding on today's nuc med bleeding scan.  Tolerating Lactulose enemas. MS a bit improved.      OBJECTIVE:         Vital signs in last 24 hours:    Temp:  [97.5 F (36.4 C)-97.8 F (36.6 C)] 97.5 F (36.4 C) (11/09 0927) Pulse Rate:  [84-95] 85 (11/09 0927) Resp:  [10-16] 11 (11/09 0927) BP: (156-182)/(82-94) 171/92 (11/09 0927) SpO2:  [96 %-100 %] 100 % (11/09 0927) Last BM Date: 11/25/18 There were no vitals filed for this visit. General: a bit more alert, still slow to respond   ENT: dark heme staining at and within mouth.  No fresh blood.  Lips swollen.  Dried blood at nares, packing in place in right nares.   Heart: RRR Chest: deep inspirations.  No dyspnea Abdomen: soft, obese, NT  Extremities: hallux amputation right foot Neuro/Psych:  Mumbled speech but appropriate.  Follows commands, moves all 4limbs.  Overall still sluggish.    Intake/Output from previous day: 11/08 0701 - 11/09 0700 In: 2395.7 [I.V.:2295.7; IV Piggyback:100] Out: 1000 [Urine:1000]  Intake/Output this shift: No intake/output data recorded.  Lab Results: Recent Labs    11/24/18 2119 11/25/18 0518 11/26/18 0658  WBC 9.1 8.5 7.9  HGB 8.2* 7.7* 8.1*  HCT 27.3* 26.0* 27.9*  PLT 83* 78* 80*   BMET Recent Labs    11/24/18 0534 11/25/18 0518 11/26/18 0658  NA 146* 150* 146*  K 3.8 3.8 3.6  CL 115* 119* 116*  CO2 22 21* 20*  GLUCOSE 149* 153* 203*  BUN 40* 39* 32*  CREATININE 2.25* 1.97* 1.61*  CALCIUM 8.8* 8.6* 8.7*   LFT Recent Labs    11/24/18 0534  PROT 7.5  ALBUMIN 2.5*  AST 16  ALT 11  ALKPHOS 110  BILITOT 1.6*   PT/INR Recent Labs    11/24/18 0542 11/26/18 0658  LABPROT 16.6* 15.7*  INR 1.4* 1.3*   Hepatitis Panel No results for input(s): HEPBSAG, HCVAB, HEPAIGM, HEPBIGM in the last  72 hours.  Studies/Results: Nm Gi Blood Loss  Result Date: 11/26/2018 CLINICAL DATA:  Chronic iron deficiency anemia with GI blood loss attributed to duodenal AVM. EXAM: NUCLEAR MEDICINE GASTROINTESTINAL BLEEDING SCAN TECHNIQUE: Sequential abdominal images were obtained following intravenous administration of Tc-73mlabeled red blood cells. RADIOPHARMACEUTICALS:  25.4 mCi Tc-952mertechnetate in-vitro labeled red cells. COMPARISON:  CT scan 10/26/2010 FINDINGS: Findings no active GI bleeding is demonstrated during the 2 hour examination. IMPRESSION: Negative nuclear medicine GI bleeding study. Electronically Signed   By: P.Marijo Sanes.D.   On: 11/26/2018 15:38    ASSESMENT:   *Maroon stoolsand/or hematochezia.  Several previous endoscopic evaluations of upper and lower GI tract 06/22/2018 small bowel enteroscopy.Oozing hemorrhage from gastric ulcers treated with BiCAP. Otherwise normal study to the jejunum. 08/18/2018 small bowel enteroscopy. Erythema at greater curvature of stomach, otherwise normal study biopsy of this area 08/21/2018 colonoscopy. Adequate prep, normal study. 10/30/18 video capsule endoscopy.Red spots in the stomach, AVM in duodenum  11/22/2018 SB enteroscopy.Subtle focal changes in the gastric fundus may be due to scope trauma, biopsied. Normal duodenum, AVM identified on yesterday's capsule study was not seen. Study into the jejunum was normal. No evidence for esophageal or gastric varices or portal hypertensive gastropathy. Gastric pathology showed focal, active, chronic nonspecific gastritis, fundic gland  polyp, reactive gastropathy with subepithelial crystalline iron deposits consistent with iron pill gastropathy, no H. pylori IV Protonix in place.  *Acute hematemesis 11/7 in setting of epistaxis.Active bleeding in right turbinate, ENT physician packed on 11/7 . Bleeding likely due to NGT trauma.    *    Acute on Chronic iron deficiency anemia.   5  PRBCs thus far, 3 latest on 11/7.  Hgb relatively stable.  *    Vaginal bleeding.  *Cirrhosis, HCV.  *   Thrombocytopenia.  Not yet critical but steady decline.  *Hepaticvstoxic metabolic encephalopathy.  MRI brain shows advanced chronic small vessel ischemic disease, widespread chronic lacunar infarcts Ammonia level 108>> 90 >> 69. Seething high-dose lactulose  *Thrombocytopenia,chronic.  *CKD.  AKI, improved.    PLAN   *   ? Any further GI wup?  Dr Blanch Media note below.    *  Would ask SLP to eval swallowing and follow their guidelines in initiating po.  When taking po, switch to Protonix 40 mg po bid.    [name]  11/26/2018, 4:11 PM Phone [phone]  GI ATTENDING  Interval history and data reviewed. Agree with interval progress note as outlined. No overt bleeding. Exhaustive GI workup completed. Mild drift in Hg noted. Nothing further to add from GI perspective. Continue PPI for mucosal protection and lactulose for possible hepatic contributions to altered MS (convert to po when appropriate). Could add Xifaxan as well (550 mg bid). Transfuse for Hg < 8. GI followup outpatient with [facility] (Dr. Jolly Mango). Call for questions or problems. Will sign off.  Docia Chuck. Geri Seminole., M.D. [facility] Division of Gastroenterology

## 2018-11-27 DIAGNOSIS — N179 Acute kidney failure, unspecified: Secondary | ICD-10-CM

## 2018-11-27 DIAGNOSIS — I130 Hypertensive heart and chronic kidney disease with heart failure and stage 1 through stage 4 chronic kidney disease, or unspecified chronic kidney disease: Secondary | ICD-10-CM

## 2018-11-27 DIAGNOSIS — E1122 Type 2 diabetes mellitus with diabetic chronic kidney disease: Secondary | ICD-10-CM

## 2018-11-27 DIAGNOSIS — R195 Other fecal abnormalities: Secondary | ICD-10-CM

## 2018-11-27 DIAGNOSIS — R400 Somnolence: Secondary | ICD-10-CM

## 2018-11-27 DIAGNOSIS — I071 Rheumatic tricuspid insufficiency: Secondary | ICD-10-CM

## 2018-11-27 DIAGNOSIS — I052 Rheumatic mitral stenosis with insufficiency: Secondary | ICD-10-CM

## 2018-11-27 DIAGNOSIS — N189 Chronic kidney disease, unspecified: Secondary | ICD-10-CM

## 2018-11-27 DIAGNOSIS — K7400 Hepatic fibrosis, unspecified: Secondary | ICD-10-CM

## 2018-11-27 DIAGNOSIS — R4182 Altered mental status, unspecified: Secondary | ICD-10-CM

## 2018-11-27 LAB — PT FACTOR INHIBITOR (MIXING STUDY)
PT 1:1NP: 10.7 s (ref 9.1–12.0)
PT: 11.8 s (ref 9.1–12.0)

## 2018-11-27 LAB — BASIC METABOLIC PANEL
Anion gap: 11 (ref 5–15)
BUN: 24 mg/dL — ABNORMAL HIGH (ref 8–23)
CO2: 19 mmol/L — ABNORMAL LOW (ref 22–32)
Calcium: 8.7 mg/dL — ABNORMAL LOW (ref 8.9–10.3)
Chloride: 115 mmol/L — ABNORMAL HIGH (ref 98–111)
Creatinine, Ser: 1.48 mg/dL — ABNORMAL HIGH (ref 0.44–1.00)
GFR calc Af Amer: 43 mL/min — ABNORMAL LOW (ref >60)
GFR calc non Af Amer: 37 mL/min — ABNORMAL LOW (ref >60)
Glucose, Bld: 123 mg/dL — ABNORMAL HIGH (ref 70–99)
Potassium: 3.5 mmol/L (ref 3.5–5.1)
Sodium: 145 mmol/L (ref 135–145)

## 2018-11-27 LAB — AMMONIA: Ammonia: 34 umol/L (ref 9–35)

## 2018-11-27 LAB — CBC
HCT: 27.5 % — ABNORMAL LOW (ref 36.0–46.0)
Hemoglobin: 7.7 g/dL — ABNORMAL LOW (ref 12.0–15.0)
MCH: 28 pg (ref 26.0–34.0)
MCHC: 28 g/dL — ABNORMAL LOW (ref 30.0–36.0)
MCV: 100 fL (ref 80.0–100.0)
Platelets: 65 10*3/uL — ABNORMAL LOW (ref 150–400)
RBC: 2.75 MIL/uL — ABNORMAL LOW (ref 3.87–5.11)
RDW: 22.3 % — ABNORMAL HIGH (ref 11.5–15.5)
WBC: 5.8 10*3/uL (ref 4.0–10.5)
nRBC: 0.3 % — ABNORMAL HIGH (ref 0.0–0.2)

## 2018-11-27 LAB — PTT FACTOR INHIBITOR (MIXING STUDY): aPTT: 22.4 s — ABNORMAL LOW (ref 22.9–30.2)

## 2018-11-27 LAB — GLUCOSE, CAPILLARY
Glucose-Capillary: 121 mg/dL — ABNORMAL HIGH (ref 70–99)
Glucose-Capillary: 105 mg/dL — ABNORMAL HIGH (ref 70–99)
Glucose-Capillary: 124 mg/dL — ABNORMAL HIGH (ref 70–99)
Glucose-Capillary: 107 mg/dL — ABNORMAL HIGH (ref 70–99)

## 2018-11-27 MED ORDER — LACTULOSE 10 GM/15ML PO SOLN
20.0000 g | ORAL | Status: DC
  Administered 2018-11-27 – 2018-12-01 (×23): 20 g via ORAL
  Filled 2018-11-27 (×23): qty 30

## 2018-11-27 MED ORDER — LACTULOSE ENEMA
300.0000 mL | ORAL | Status: DC
  Filled 2018-11-27 (×4): qty 300

## 2018-11-27 MED ORDER — RIFAXIMIN 550 MG PO TABS
550.0000 mg | ORAL_TABLET | Freq: Two times a day (BID) | ORAL | Status: DC
  Administered 2018-11-27 – 2018-12-07 (×21): 550 mg via ORAL
  Filled 2018-11-27 (×22): qty 1

## 2018-11-27 NOTE — Evaluation (Signed)
Physical Therapy Evaluation Patient Details Name: [name] MRN: [id number] DOB: [birth year] Today's Date: 11/27/2018   History of Present Illness  63 yo female admitted to ED on 11/3 from [facility] with heme + stool, AMS suspect due to either toxic metabolic vs hepatic encephalopathy. 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 Enteroscopy procedure 11/5 reveals changes in focal area of fundus of unknown significance, source of bleeding not discovered. Pt with NGT placement, then removed, with periods of bloody vomit and nosebleeds due to NGT trauma (11/7). PMH includes median sternotomy, CABG, L great toe amp 6/7, cervical CA, CHF, hep C, chronic anemia, COPD, DM with polyneuropathy, NSTEMI, upper GI bleed, h/o carpal tunnel syndrome, HNP lumbar.  Clinical Impression   Pt presents with significant LE/UE/truncal weakness, difficulty performing bed mobility, fair sitting balance, inability to progress to standing even with total assist, and decreased activity tolerance. Pt to benefit from acute PT to address deficits. Pt required mod-max assist +1-2 for bed mobility this session, and required significant assist for pericare due to pt being soiled in feces post-EOB activity. Pt sat EOB ~10 minutes with occasional PT steadying assist. PT recommending SNF level of care post-acutely. PT to progress mobility as tolerated, and will continue to follow acutely.      Follow Up Recommendations SNF    Equipment Recommendations  None recommended by PT    Recommendations for Other Services       Precautions / Restrictions Precautions Precautions: Fall Precaution Comments: R nostril packing placed Restrictions Weight Bearing Restrictions: No      Mobility  Bed Mobility Overal bed mobility: Needs Assistance Bed Mobility: Supine to Sit;Sit to Supine     Supine to sit: Mod assist;HOB elevated Sit to supine: Max assist;HOB  elevated   General bed mobility comments: mod-max assist for trunk and LE management, increased assist for back to bed due to pt fatigue  Transfers                 General transfer comment: attempted, unable to come to standing with total assist +1 and pt actively resisting  Ambulation/Gait                Stairs            Wheelchair Mobility    Modified Rankin (Stroke Patients Only)       Balance Overall balance assessment: Needs assistance Sitting-balance support: Feet supported;Bilateral upper extremity supported Sitting balance-Leahy Scale: Fair Sitting balance - Comments: able to sit EOB without PT assist   Standing balance support: During functional activity Standing balance-Leahy Scale: Zero Standing balance comment: unable to come to standing with total assist                             Pertinent Vitals/Pain Pain Assessment: Faces Faces Pain Scale: No hurt Pain Intervention(s): Limited activity within patient's tolerance;Monitored during session    Home Living Family/patient expects to be discharged to:: [place]: Gilford Rile - 2 wheels;Wheelchair - [employer];Tub bench;Grab bars - tub/shower Additional Comments: per previous note, pt minimally conversive with PT this session    Prior Function Level of Independence: Needs assistance   Gait / Transfers Assistance Needed: from 10/14 - Ambulates around 20 feet with SPC or SW; uses manual w/c (self propels) in  community/outside; Pt states "no" when PT asks if pt has walked recently  ADL's / Homemaking Assistance Needed: unsure of assist needs at SNF        Hand Dominance   Dominant Hand: Right    Extremity/Trunk Assessment   Upper Extremity Assessment Upper Extremity Assessment: Generalized weakness;Difficult to assess due to impaired cognition    Lower Extremity Assessment Lower Extremity Assessment: Generalized  weakness;Difficult to assess due to impaired cognition    Cervical / Trunk Assessment Cervical / Trunk Assessment: Other exceptions Cervical / Trunk Exceptions: forward head, rounded shoulders, forward trunk flexion when sitting EOB  Communication   Communication: Expressive difficulties(hard to understand at times)  Cognition Arousal/Alertness: Lethargic Behavior During Therapy: Flat affect Overall Cognitive Status: Impaired/Different from baseline Area of Impairment: Orientation;Memory;Following commands;Safety/judgement;Awareness;Problem solving;Attention                 Orientation Level: Disoriented to;Time;Situation Current Attention Level: Focused Memory: Decreased short-term memory Following Commands: Follows one step commands inconsistently;Follows one step commands with increased time Safety/Judgement: Decreased awareness of deficits;Decreased awareness of safety Awareness: Intellectual Problem Solving: Slow processing;Decreased initiation;Difficulty sequencing;Requires verbal cues;Requires tactile cues General Comments: Pt states she is in [place]., does not know she is in the hospital or situation. Pt oriented to self only. Pt initates mobility well, requires PT assist for sequencing. Very increased time to respond to PT, minimal verbalizations from PT. Pt incotinent of stool with steady stool stream during pericare.      General Comments General comments (skin integrity, edema, etc.): VSS, except one period sats 85% but poor pleth wave and pt asymptomatic. Sacral redness and start of skin breakdown noted, RN notified    Exercises     Assessment/Plan    PT Assessment Patient needs continued PT services  PT Problem List Decreased strength;Decreased activity tolerance;Decreased balance;Decreased mobility;Decreased knowledge of use of DME;Decreased cognition       PT Treatment Interventions DME instruction;Gait training;Functional mobility training;Therapeutic  activities;Therapeutic exercise;Balance training;Patient/family education    PT Goals (Current goals can be found in the Care Plan section)  Acute Rehab PT Goals PT Goal Formulation: Patient unable to participate in goal setting Time For Goal Achievement: 12/11/18 Potential to Achieve Goals: Fair    Frequency Min 2X/week   Barriers to discharge        Co-evaluation               AM-PAC PT "6 Clicks" Mobility  Outcome Measure Help needed turning from your back to your side while in a flat bed without using bedrails?: A Lot Help needed moving from lying on your back to sitting on the side of a flat bed without using bedrails?: A Lot Help needed moving to and from a bed to a chair (including a wheelchair)?: Total Help needed standing up from a chair using your arms (e.g., wheelchair or bedside chair)?: Total Help needed to walk in hospital room?: Total Help needed climbing 3-5 steps with a railing? : Total 6 Click Score: 8    End of Session   Activity Tolerance: Patient limited by fatigue;Patient limited by lethargy Patient left: in bed;with call bell/phone within reach;with nursing/sitter in room(NT finishing pericare) Nurse Communication: Mobility status;Other (comment)(IV leaking) PT Visit Diagnosis: Other abnormalities of gait and mobility (R26.89);Muscle weakness (generalized) (M62.81);Unsteadiness on feet (R26.81)    Time: 1430-1500 PT Time Calculation (min) (ACUTE ONLY): 30 min   Charges:   PT Evaluation $PT Eval Low Complexity: 1 Low PT Treatments $Therapeutic Activity: 8-22 mins  [place] Pager [phone]  Office [phone]  [place] 11/27/2018, 4:13 PM

## 2018-11-27 NOTE — Progress Notes (Signed)
NAME:  [name], MRN:  [id number], DOB:  [birth year], LOS: 7 ADMISSION DATE:  11/20/2018,  Primary: [place]    Brief History  63 yo female who resides at a skilled nursing facility with PMH of small bowel AVMs, COPD, HFrEF, DM 2, chronic hepatitis C, HTN, HLD, and sleep apnea presented to ED for GI bleeding and altered mental status.  Hgb on admission 6.3 requiring transfusionx2. Previous caps endoscopy signficant for AVMs however when small bowel endoscopy was attempted, patient acutely decompensated. Repeat endoscopy on 11/5 did not reveal etiology of bleed despite continued GI bleeding. Patient also presented with acute encephalopathy. Husband and facility notes that patient was previously a/o. Etiology remains unclear however neuro is hopeful that it will improve with resolving hyperammonemia via lactulose.  Subjective  No overnight events. VSS Ammonia now normal! Objective   Blood pressure (!) 141/63, pulse 84, temperature 98.2 F (36.8 C), temperature source Axillary, resp. rate 11, weight 108 kg, SpO2 99 %.     Intake/Output Summary (Last 24 hours) at 11/27/2018 0533 Last data filed at 11/27/2018 0000 Gross per 24 hour  Intake 100 ml  Output 950 ml  Net -850 ml    Examination: General: Chronically ill-appearing Cardiac: Heart regular rate and rhythm Pulmonary: Lung sounds clear to auscultation Abdomen: Soft, nondistended.  Nontender to palpation Neuro: Awake.  Alert to person and place.  Answering questions.  Following commands. Skin: No rash  Consults:  GI Neurology Significant Diagnostic Tests:  11/3 CXR-unremarkable 11/3 CT head-no acute changes 11/4 MRI brain: no acute changes 11/5 EEG>>no epileptiform pattern 11/5 endoscopy>>no AVMs 11/5 surgical path>>no H. Pylori 11/9 Tagged RBC scan neg  Micro Data:  11/3 blood culture>> no growth 11/3 urine culture>> no growth  Labs    CBC Latest Ref Rng & Units 11/26/2018 11/25/2018 11/24/2018   WBC 4.0 - 10.5 K/uL 7.9 8.5 9.1  Hemoglobin 12.0 - 15.0 g/dL 8.1(L) 7.7(L) 8.2(L)  Hematocrit 36.0 - 46.0 % 27.9(L) 26.0(L) 27.3(L)  Platelets 150 - 400 K/uL 80(L) 78(L) 83(L)   BMP Latest Ref Rng & Units 11/26/2018 11/25/2018 11/24/2018  Glucose 70 - 99 mg/dL 203(H) 153(H) 149(H)  BUN 8 - 23 mg/dL 32(H) 39(H) 40(H)  Creatinine 0.44 - 1.00 mg/dL 1.61(H) 1.97(H) 2.25(H)  Sodium 135 - 145 mmol/L 146(H) 150(H) 146(H)  Potassium 3.5 - 5.1 mmol/L 3.6 3.8 3.8  Chloride 98 - 111 mmol/L 116(H) 119(H) 115(H)  CO2 22 - 32 mmol/L 20(L) 21(L) 22  Calcium 8.9 - 10.3 mg/dL 8.7(L) 8.6(L) 8.8(L)   Stool occult + Ammonia 110>>101>>91>>90>>69>>51>>34 Fibrinogen wnl INR 1.5>>1.4>>1.3 PTT 32 (wnl) Summary  63 yo female with PMH of small bowel AVMs, COPD, HFrEF, DM 2, chronic hepatitis C, HTN, HLD, and sleep apnea presented to ED for GI bleeding and altered mental status.   Assessment & Plan:  Active Problems:   Acute on chronic blood loss anemia   Encephalopathy acute   Gastritis and gastroduodenitis  Acute on chronic blood loss anemia GI bleed. History of gastric and small bowel AVMs  Appears to be improving.  Still no clear source of bleeding. Upper endoscopy neg for finding source of bleed. Tagged RBC scan 11/9 neg. VSS Hgb 8.1>>7.7 Mixing studies pending. Plan Continue to trend h/h. Transfuse for hgb <7 protonix 50m daily Further management per GI recs  Posterior nasal bleed 2/2 NG trauma. Resolved with rhino rocket placement on 11/7 per ENT Plan: rhinorocket to remain in place 3-4d.  Can remove today or tomorrow.  D/c rocephin  Acute encephalopathy.  Improving.  Given her mental status improvement with down trending ammonia levels, could attribute encephalopathy to hyperammonemia.   Liver fibrosis as suggested on 10/10 abd [place]. Hx of HCV that was reportedly treated per husband. Care everywhere shows neg RNA as of 12/2014. Endoscopy report notes no findings consistent with portal  hypertension or hepatic gastropathy. Ammonia level wnl today! Thrombocytopenia--platelets continue to drop--80>>65  Plan Continue lactulose ST eval. Hopeful to be able to transition to po lactulose Will need outpatient follow up for liver eval  HFrEF. TEE in October significant for EF of 50-55%. Normal systolic function. Moderate dilation of LA, RA. Moderate MVR and stenosis. Moderate TVR. Plan: tele monitor. Daily weights.  Strict I/O. HTN: stable on admission. Continue coreg. DM II. Stable. Continue SSI Acute on CKD. Continuing to improve.   Best practice:  CODE STATUS:Full Diet: NPO.  GI prophylaxis: protonix 56m  DVT for prophylaxis: hold for active bleeding. SCDs Dispo: resides at skilled facility.    RMitzi Hansen MD INTERNAL MEDICINE RESIDENT PGY-1 PAGER #: [phone]/10/20 5:33 AM

## 2018-11-27 NOTE — TOC Progression Note (Signed)
Transition of Care [facility]) - Progression Note    Patient Details  Name: [name] MRN: [id number] Date of Birth: [birth year]  Transition of Care [facility]) CM/SW [place], [place] Phone Number: 11/27/2018, 11:02 AM  Clinical Narrative:    [facility] team continues to follow for placement back at [facility] where pt is a resident. FL2 complete. Pt will need a new swab for COVID prior to return to SNF.    Expected Discharge Plan: Skilled Nursing Facility Barriers to Discharge: Continued Medical Work up  Expected Discharge Plan and Services Expected Discharge Plan: [place]   Social Determinants of Health (SDOH) Interventions    Readmission Risk Interventions Readmission Risk Prevention Plan 10/26/2018 08/21/2018 09/30/2017  Transportation Screening Complete Complete Complete  PCP or Specialist Appt within 5-7 Days - - Complete  Home Care Screening - - Complete  Medication Review (RN CM) - - Complete  HRI or Home Care Consult Complete Complete -  Social Work Consult for [place] Planning/Counseling Not Complete - -  SW consult not completed comments not recovery care - -  Palliative Care Screening Not Applicable Not Applicable -  Medication Review (RN Care Manager) Referral to Pharmacy Complete -  [facility] or Home Care Consult Complete - -  Palliative Care Screening Not Applicable - -  Some recent data might be hidden

## 2018-11-27 NOTE — Evaluation (Signed)
Clinical/Bedside Swallow Evaluation Patient Details  Name: [name] MRN: [id number] Date of Birth: 1955-03-28  Today's Date: 11/27/2018 Time: SLP Start Time (ACUTE ONLY): 25 SLP Stop Time (ACUTE ONLY): 1105 SLP Time Calculation (min) (ACUTE ONLY): 15 min  Past Medical History:  Past Medical History:  Diagnosis Date  . Cervical cancer ([place])   . CHF (congestive heart failure) ([place])   . Chronic anemia   . COPD (chronic obstructive pulmonary disease) ([place])   . Diabetes mellitus without complication ([place])   . Gastric ulcer   . NSTEMI (non-ST elevated myocardial infarction) ([place])   . Upper GI bleed    Past Surgical History:  Past Surgical History:  Procedure Laterality Date  . AMPUTATION Left 06/24/2018   Procedure: AMPUTATION GREAT TOE;  Surgeon: Samara Deist, DPM;  Location: [facility];  Service: Podiatry;  Laterality: Left;  . APPENDECTOMY    . BIOPSY  11/22/2018   Procedure: BIOPSY;  Surgeon: Thornton Park, MD;  Location: [place];  Service: Gastroenterology;;  . CARDIAC CATHETERIZATION    . CHOLECYSTECTOMY    . COLONOSCOPY N/A 09/28/2017   Procedure: COLONOSCOPY;  Surgeon: Toledo, Benay Pike, MD;  Location: [facility] ENDOSCOPY;  Service: Gastroenterology;  Laterality: N/A;  . COLONOSCOPY WITH PROPOFOL N/A 08/21/2018   Procedure: COLONOSCOPY WITH PROPOFOL;  Surgeon: Lin Landsman, MD;  Location: [facility] ENDOSCOPY;  Service: Gastroenterology;  Laterality: N/A;  . ENTEROSCOPY Left 11/06/2017   Procedure: ENTEROSCOPY;  Surgeon: Jonathon Bellows, MD;  Location: [facility] ENDOSCOPY;  Service: Gastroenterology;  Laterality: Left;  . ENTEROSCOPY N/A 06/22/2018   Procedure: ENTEROSCOPY;  Surgeon: Lin Landsman, MD;  Location: [facility] ENDOSCOPY;  Service: Gastroenterology;  Laterality: N/A;  . ENTEROSCOPY N/A 08/18/2018   Procedure: ENTEROSCOPY;  Surgeon: Jonathon Bellows, MD;  Location: [facility] ENDOSCOPY;  Service: Gastroenterology;  Laterality: N/A;  . ENTEROSCOPY N/A 11/01/2018   Procedure:  ENTEROSCOPY;  Surgeon: Virgel Manifold, MD;  Location: [facility] ENDOSCOPY;  Service: Endoscopy;  Laterality: N/A;  . ENTEROSCOPY N/A 11/22/2018   Procedure: ENTEROSCOPY;  Surgeon: Thornton Park, MD;  Location: [place];  Service: Gastroenterology;  Laterality: N/A;  . ESOPHAGOGASTRODUODENOSCOPY N/A 12/19/2016   Procedure: ESOPHAGOGASTRODUODENOSCOPY (EGD);  Surgeon: Lin Landsman, MD;  Location: [facility] ENDOSCOPY;  Service: Gastroenterology;  Laterality: N/A;  . ESOPHAGOGASTRODUODENOSCOPY N/A 09/28/2017   Procedure: ESOPHAGOGASTRODUODENOSCOPY (EGD);  Surgeon: Toledo, Benay Pike, MD;  Location: [facility] ENDOSCOPY;  Service: Gastroenterology;  Laterality: N/A;  . ESOPHAGOGASTRODUODENOSCOPY (EGD) WITH PROPOFOL N/A 08/08/2017   Procedure: ESOPHAGOGASTRODUODENOSCOPY (EGD) WITH PROPOFOL;  Surgeon: [name], MD;  Location: [facility] ENDOSCOPY;  Service: Endoscopy;  Laterality: N/A;  . GIVENS CAPSULE STUDY N/A 10/28/2018   Procedure: GIVENS CAPSULE STUDY;  Surgeon: Jonathon Bellows, MD;  Location: [facility] ENDOSCOPY;  Service: Gastroenterology;  Laterality: N/A;  . [place] in [place]    . LEFT HEART CATH AND CORONARY ANGIOGRAPHY N/A 12/23/2016   Procedure: LEFT HEART CATH AND CORONARY ANGIOGRAPHY;  Surgeon: Teodoro Spray, MD;  Location: [place] CV LAB;  Service: Cardiovascular;  Laterality: N/A;  . LOWER EXTREMITY ANGIOGRAPHY Left 06/26/2018   Procedure: Lower Extremity Angiography;  Surgeon: Katha Cabal, MD;  Location: [place] CV LAB;  Service: Cardiovascular;  Laterality: Left;  . TEE WITHOUT CARDIOVERSION N/A 10/29/2018   Procedure: TRANSESOPHAGEAL ECHOCARDIOGRAM (TEE);  Surgeon: Teodoro Spray, MD;  Location: [facility] ORS;  Service: Cardiovascular;  Laterality: N/A;   HPI:  Pt is a 63 yo female admitted from SNF with GI bleed and AMS. PMH: small bowel AVMs, COPD,  HFrEF, DM 2, chronic hepatitis C, HTN, HLD, and sleep apnea   Assessment / Plan / Recommendation Clinical  Impression  Pt is awake but not following commands well and not engaging in self-feeding. Once POs pass her lips she has increased automaticity, although transit is slow. She has a second swallow regardless of consistency as well as possible esophageal symptoms that include frequent belching and an audible "gurgling" sound, but there are no overt s/s of aspiration. Suspect a primary esophageal component but this, combined with her mentation, could put her at an increased risk for aspiration. Recommend that she be NPO except for meds crushed in puree. Pt could also be given a few small sips of water or pieces of ice immediately following oral care if alert. SLP will f/u for tolerance and readiness to progress to a PO diet.  SLP Visit Diagnosis: Dysphagia, oropharyngeal phase (R13.12)    Aspiration Risk  Moderate aspiration risk    Diet Recommendation NPO except meds;Ice chips PRN after oral care;Free water protocol after oral care   Medication Administration: Crushed with puree    Other  Recommendations Oral Care Recommendations: Oral care QID Other Recommendations: Have oral suction available   Follow up Recommendations Skilled Nursing facility      Frequency and Duration min 2x/week  2 weeks       Prognosis Prognosis for Safe Diet Advancement: Good Barriers to Reach Goals: Cognitive deficits      Swallow Study   General HPI: Pt is a 63 yo female admitted from SNF with GI bleed and AMS. PMH: small bowel AVMs, COPD, HFrEF, DM 2, chronic hepatitis C, HTN, HLD, and sleep apnea Type of Study: Bedside Swallow Evaluation Previous Swallow Assessment: none in chart Diet Prior to this Study: NPO Temperature Spikes Noted: No Respiratory Status: Room air History of Recent Intubation: No Behavior/Cognition: Alert;Cooperative;Requires cueing Oral Cavity Assessment: Dry Oral Care Completed by SLP: No Oral Cavity - Dentition: Adequate natural dentition Self-Feeding Abilities: Total  assist Patient Positioning: Upright in bed Baseline Vocal Quality: Low vocal intensity Volitional Cough: Weak Volitional Swallow: Unable to elicit    Oral/Motor/Sensory Function Overall Oral Motor/Sensory Function: (not following commands to directly assess)   Ice Chips Ice chips: Impaired Presentation: Spoon Oral Phase Functional Implications: Prolonged oral transit   Thin Liquid Thin Liquid: Impaired Presentation: Spoon;Straw Oral Phase Impairments: Poor awareness of bolus Oral Phase Functional Implications: Prolonged oral transit Pharyngeal  Phase Impairments: Multiple swallows    Nectar Thick Nectar Thick Liquid: Not tested   Honey Thick Honey Thick Liquid: Not tested   Puree Puree: Impaired Presentation: Spoon Oral Phase Functional Implications: Prolonged oral transit Pharyngeal Phase Impairments: Multiple swallows   Solid     Solid: Not tested      [name] Amyiah Gaba 11/27/2018,12:31 PM   [name], M.A. [place] Acute Environmental education officer [phone] Office [phone]

## 2018-11-28 LAB — BPAM RBC
Blood Product Expiration Date: 202012112359
Blood Product Expiration Date: 202012112359
Blood Product Expiration Date: 202012112359
ISSUE DATE / TIME: 202011070822
ISSUE DATE / TIME: 202011071448
Unit Type and Rh: 5100
Unit Type and Rh: 5100
Unit Type and Rh: 5100

## 2018-11-28 LAB — TYPE AND SCREEN
ABO/RH(D): O POS
Antibody Screen: NEGATIVE
Unit division: 0
Unit division: 0
Unit division: 0

## 2018-11-28 LAB — CBC
HCT: 27.9 % — ABNORMAL LOW (ref 36.0–46.0)
Hemoglobin: 7.9 g/dL — ABNORMAL LOW (ref 12.0–15.0)
MCH: 28.2 pg (ref 26.0–34.0)
MCHC: 28.3 g/dL — ABNORMAL LOW (ref 30.0–36.0)
MCV: 99.6 fL (ref 80.0–100.0)
Platelets: 65 10*3/uL — ABNORMAL LOW (ref 150–400)
RBC: 2.8 MIL/uL — ABNORMAL LOW (ref 3.87–5.11)
RDW: 22 % — ABNORMAL HIGH (ref 11.5–15.5)
WBC: 6.5 10*3/uL (ref 4.0–10.5)
nRBC: 0 % (ref 0.0–0.2)

## 2018-11-28 LAB — GLUCOSE, CAPILLARY
Glucose-Capillary: 118 mg/dL — ABNORMAL HIGH (ref 70–99)
Glucose-Capillary: 133 mg/dL — ABNORMAL HIGH (ref 70–99)
Glucose-Capillary: 138 mg/dL — ABNORMAL HIGH (ref 70–99)
Glucose-Capillary: 130 mg/dL — ABNORMAL HIGH (ref 70–99)

## 2018-11-28 NOTE — Progress Notes (Signed)
Patient's nose has been bleeding throughout the afternoon after rhinorocket was removed. MD aware and recommended Afrin and packing nose with gauze. Gauze required changing approximately every hour or two and Afrin and saline nasal spray used. Also left voicemail for Dr. Redmond Baseman' office (ENT) regarding bleeding and awaiting response.

## 2018-11-28 NOTE — Progress Notes (Signed)
  Speech Language Pathology Treatment: Dysphagia  Patient Details Name: [name] MRN: [id number] DOB: 1955/08/19 Today's Date: 11/28/2018 Time: 2774-1287 SLP Time Calculation (min) (ACUTE ONLY): 28 min  Assessment / Plan / Recommendation Clinical Impression  Pt visited for upgraded trials of POs since previous BSE yesterday. Upon arrival pt appeared more alert and interactive than previous session and was joined by her husband throughout the session. Encountered with gauze in R nasal cavity due to epistaxis. Pt observed with thins (cup/straw), puree, and solid consistencies and required assist with cup/self-fed cracker. Oral phase WNL with efficient mastication and adequately timed swallow initiation. Min-no residue following solids. Pharyngeal phase significant for audible swallows followed by small belches, suspect due to decreased reciprocity of breathing and swallow coordination (possibly impacted by nasal cavity occlusion). No s/sx of aspiration across trials, including 5-6 large consecutive sips from straw. Education provided to pt and husband on aspiration precautions and treatment plan. Due to suspected incoordinationand possible impact of cognitive deficits on swallow, recommend initiation of Dysphagia 2 (minced/chopped) with thin liquids (cup or straw), meds whole in puree. Meals only when pt alert with full supervision to assist with feeding. SLP will continue to follow for diet toleration.    HPI HPI: Pt is a 63 yo female admitted from SNF with GI bleed and AMS. PMH: small bowel AVMs, COPD, HFrEF, DM 2, chronic hepatitis C, HTN, HLD, and sleep apnea      SLP Plan  Goals updated       Recommendations  Diet recommendations: Dysphagia 2 (fine chop);Thin liquid Liquids provided via: Cup;Straw Medication Administration: Whole meds with puree Supervision: Full supervision/cueing for compensatory strategies;Staff to assist with self feeding Compensations: Slow rate;Small  sips/bites;Minimize environmental distractions Postural Changes and/or Swallow Maneuvers: Seated upright 90 degrees;Upright 30-60 min after meal                Oral Care Recommendations: Staff/trained caregiver to provide oral care;Oral care BID Follow up Recommendations: Skilled Nursing facility SLP Visit Diagnosis: Dysphagia, unspecified (R13.10) Plan: Goals updated       GO                [name] 11/28/2018, 11:26 AM

## 2018-11-28 NOTE — Plan of Care (Signed)
PATIENT WAS SEEN FOR SKILLED OT TO ASSESS FOR NEEDS. PATIENT HAS DECREASED I AND SAFETY WITH ADLS AND MOBILITY. PATIENT REFUSED OOB OR EOB ACTIVITY. PATIENT WAS VERY LETHARGIC AND DID APPEAR TO IMPACT FUNCTION DURING ADLS. PATIENT HAD DIFFICULTY ANSWERING QUESTIONS DURING EVAL AND REQUIRED EXTRA TIME. PATIENT WAS MIN A TO ROLL TO L BUT REFUSED TO ROLL TO R. PATIENT CAME FROM SNF AND WOULD BENEFIT FROM FURTHER OT AT SNF. ACUTE OT TO FOLLOW.

## 2018-11-28 NOTE — Evaluation (Signed)
Occupational Therapy Evaluation Patient Details Name: [name] MRN: [id number] DOB: [birth year] Today's Date: 11/28/2018    History of Present Illness 63 yo female admitted to ED on 11/3 from [facility] with heme + stool, AMS suspect due to either toxic metabolic vs hepatic encephalopathy. 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 Enteroscopy procedure 11/5 reveals changes in focal area of fundus of unknown significance, source of bleeding not discovered. Pt with NGT placement, then removed, with periods of bloody vomit and nosebleeds due to NGT trauma (11/7). PMH includes median sternotomy, CABG, L great toe amp 6/7, cervical CA, CHF, hep C, chronic anemia, COPD, DM with polyneuropathy, NSTEMI, upper GI bleed, h/o carpal tunnel syndrome, HNP lumbar.   Clinical Impression   PATIENT WAS SEEN FOR SKILLED OT TO ASSESS FOR NEEDS. PATIENT HAS DECREASED I AND SAFETY WITH ADLS AND MOBILITY. PATIENT REFUSED OOB OR EOB ACTIVITY. PATIENT WAS VERY LETHARGIC AND DID APPEAR TO IMPACT FUNCTION DURING ADLS. PATIENT HAD DIFFICULTY ANSWERING QUESTIONS DURING EVAL AND REQUIRED EXTRA TIME. PATIENT WAS MIN A TO ROLL TO L BUT REFUSED TO ROLL TO R. PATIENT CAME FROM SNF AND WOULD BENEFIT FROM FURTHER OT AT SNF. ACUTE OT TO FOLLOW.     Follow Up Recommendations  SNF    Equipment Recommendations  None recommended by OT    Recommendations for Other Services       Precautions / Restrictions Precautions Precautions: Fall Restrictions Weight Bearing Restrictions: No      Mobility Bed Mobility               General bed mobility comments: MIN A TO ROLL L AND REFUSED TO ROLL R.   Transfers                 General transfer comment: REFUSED TO SIT EOB    Balance                                           ADL either performed or assessed with clinical judgement   ADL Overall ADL's : Needs  assistance/impaired Eating/Feeding: Moderate assistance;Bed level   Grooming: Wash/dry hands;Wash/dry face;Maximal assistance;Bed level   Upper Body Bathing: Total assistance Upper Body Bathing Details (indicate cue type and reason): COGNITION APPEARS TO BE LIMITING FUNCTION Lower Body Bathing: Total assistance;Bed level   Upper Body Dressing : Maximal assistance;Bed level   Lower Body Dressing: Total assistance;Bed level               Functional mobility during ADLs: (PATIENT REFUSED TO ATTEMPT TO SIT EOB) General ADL Comments: PATIENT ADL ABILITY APPEARS TO BE LIMITED BY COGNITION.     Vision Baseline Vision/History: (PATIENT STATES SHE DOES NOT WEAR GLASSES BUT CHART WEARS.)       Perception     Praxis      Pertinent Vitals/Pain Pain Assessment: 0-10 Pain Score: 6  Pain Intervention(s): Patient requesting pain meds-RN notified     Hand Dominance Right   Extremity/Trunk Assessment Upper Extremity Assessment Upper Extremity Assessment: Generalized weakness(patient is not following dfirection consistanly)           Communication Communication Communication: Expressive difficulties   Cognition Arousal/Alertness: Lethargic Behavior During Therapy: Flat affect Overall Cognitive Status: No family/caregiver present to determine baseline cognitive functioning Area of Impairment: Orientation;Following commands  Orientation Level: Disoriented to;Place;Time;Situation   Memory: Decreased short-term memory Following Commands: Follows one step commands inconsistently           General Comments  PATIENT HAD NOSEBLEED DURING SESSION AND NURSE ATTENDED IT.  PATIENT STATES SHE GET NOSEBLEEDS.     Exercises Low Level/ICU Exercises Shoulder Flexion: AAROM;Both;10 reps;Supine   Shoulder Instructions      Home Living Family/patient expects to be discharged to:: Skilled nursing facility                                         Prior Functioning/Environment Level of Independence: Needs assistance    ADL's / Homemaking Assistance Needed: unknown level at snf            OT Problem List: Decreased strength;Decreased activity tolerance;Impaired balance (sitting and/or standing);Decreased cognition;Decreased safety awareness      OT Treatment/Interventions: Self-care/ADL training;Therapeutic activities;Patient/family education    OT Goals(Current goals can be found in the care plan section) Acute Rehab OT Goals Patient Stated Goal: none stated OT Goal Formulation: Patient unable to participate in goal setting Time For Goal Achievement: 12/12/18 Potential to Achieve Goals: Good  OT Frequency: Min 2X/week   Barriers to D/C:            Co-evaluation              AM-PAC OT "6 Clicks" Daily Activity     Outcome Measure Help from another person eating meals?: A Lot Help from another person taking care of personal grooming?: A Lot Help from another person toileting, which includes using toliet, bedpan, or urinal?: Total Help from another person bathing (including washing, rinsing, drying)?: Total Help from another person to put on and taking off regular upper body clothing?: A Lot Help from another person to put on and taking off regular lower body clothing?: Total 6 Click Score: 9   End of Session Nurse Communication: (DISCUSSED PNT REQUEST PAIN MEDICINE AND HER NOSEBLEED.)  Activity Tolerance: Patient limited by lethargy Patient left: in bed;with call bell/phone within reach;with bed alarm set  OT Visit Diagnosis: Unsteadiness on feet (R26.81);Muscle weakness (generalized) (M62.81)                Time: 6004-5997 OT Time Calculation (min): 42 min Charges:  OT General Charges $OT Visit: 1 Visit OT Evaluation $OT Eval Moderate Complexity: 1 Mod OT Treatments $Self Care/Home Management : 7-41 mins  6 CLICKS  Eliot Popper 11/28/2018, 10:49 AM

## 2018-11-28 NOTE — Progress Notes (Signed)
   Subjective:    Patient ID: [name], female    DOB: 11-Dec-1955, 63 y.o.   MRN: [id number]  HPI  No nose bleeding.  Mental status only slightly improved.  Review of Systems     Objective:   Physical Exam AF VSS Somnolent, makes some words Right nasal pack removed, no active bleeding     Assessment & Plan:  Right posterior epistaxis  Removed pack.  No active bleeding.  Call with further problems.

## 2018-11-28 NOTE — Progress Notes (Addendum)
NAME:  [name], MRN:  [id number], DOB:  1955/04/02, LOS: 4 ADMISSION DATE:  11/20/2018,  Primary: [place]    Brief History  63 yo female who resides at a skilled nursing facility with PMH of small bowel AVMs, COPD, HFrEF, DM 2, chronic hepatitis C, HTN, HLD, and sleep apnea presented to ED for GI bleeding and altered mental status.  Hgb on admission 6.3 requiring transfusionx2. Previous caps endoscopy signficant for AVMs however when small bowel endoscopy was attempted, patient acutely decompensated. Repeat endoscopy on 11/5 did not reveal etiology of bleed despite continued GI bleeding. Patient also presented with acute encephalopathy. Husband and facility notes that patient was previously a/o. Etiology remains unclear however neuro is hopeful that it will improve with resolving hyperammonemia via lactulose.  Subjective  Sat at edge of bed with therapy yesterday Much more alert this morning. When I walked in this morning, she said "Good morning doc. How are you doing?". Huge improvement from a few days ago. Nursing reports continued dark stools. No overnight events. VSS  Objective   Blood pressure 140/89, pulse 85, temperature 98.9 F (37.2 C), temperature source Axillary, resp. rate 10, weight 107.4 kg, SpO2 97 %.     Intake/Output Summary (Last 24 hours) at 11/28/2018 6803 Last data filed at 11/27/2018 1556 Gross per 24 hour  Intake   Output 750 ml  Net -750 ml    Examination: General: Chronically ill-appearing HEENT: packing in right nostril Cardiac: Heart regular rate and rhythm Pulmonary: Lung sounds clear to auscultation Abdomen: Soft, nondistended.  Nontender to palpation Neuro: A/O x3. Following commands. EOMs intact. Face symmetric.  Skin: No rash. No jaundice  Consults:  GI-signed off Neurology-signed off Significant Diagnostic Tests:  11/3 CXR-unremarkable 11/3 CT head-no acute changes 11/4 MRI brain: no acute changes 11/5 EEG>>no  epileptiform pattern 11/5 endoscopy>>no AVMs 11/5 surgical path>>no H. Pylori 11/9 Tagged RBC scan neg  Micro Data:  11/3 blood culture>> no growth 11/3 urine culture>> no growth  Labs    CBC Latest Ref Rng & Units 11/28/2018 11/27/2018 11/26/2018  WBC 4.0 - 10.5 K/uL 6.5 5.8 7.9  Hemoglobin 12.0 - 15.0 g/dL 7.9(L) 7.7(L) 8.1(L)  Hematocrit 36.0 - 46.0 % 27.9(L) 27.5(L) 27.9(L)  Platelets 150 - 400 K/uL 65(L) 65(L) 80(L)   Stool occult + Ammonia 110>>101>>91>>90>>69>>51>>34 Fibrinogen wnl INR 1.5>>1.4>>1.3 PTT 32 (wnl) Mixing studies wnl Summary  63 yo female with PMH of small bowel AVMs, COPD, HFrEF, DM 2, chronic hepatitis C, HTN, HLD, and sleep apnea presented to ED for GI bleeding and altered mental status.   Assessment & Plan:  Active Problems:   Acute on chronic blood loss anemia   Encephalopathy acute   Gastritis and gastroduodenitis   Altered mental status   Heme positive stool  Acute on chronic blood loss anemia GI bleed. History of gastric and small bowel AVMs  Appears to be improving.  Still no clear source of bleeding. Upper endoscopy neg for finding source of bleed. Tagged RBC scan 11/9 neg. GI signed off Hgb stable. 7.7>>7.9 Mixing studies wnl Plan Continue to trend h/h. Transfuse for hgb <7 protonix 37m daily  Posterior nasal bleed 2/2 NG trauma. Resolved with nasal packing placement on 11/7 per ENT Plan: remove today  Acute metabolic encephalopathy. Suspected due to Hepatic Encephalopahty/hyperammoniemia  Improving.  Given her mental status improvement with down trending ammonia levels, could attribute encephalopathy to hyperammonemia.  Neuro signed off. 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 Hx of HCV that was reportedly treated per husband. Care everywhere shows neg RNA as of 12/2014. Endoscopy report notes no findings consistent with portal hypertension or  hepatic gastropathy. Thrombocytopenia stable at 64 Plan Continue lactulose. rifaxamin started 11/10. Will need outpatient follow up for liver eval  Physical deconditioning. PT/OT/ST. Sat at edge of bed with PT yesterday. Continue therapy.   HFrEF. TEE in October significant for EF of 50-55%. Normal systolic function. Moderate dilation of LA, RA. Moderate MVR and stenosis. Moderate TVR. Plan: tele monitor. Daily weights.  Strict I/O. HTN: stable on admission. Continue coreg. DM II. Stable. Continue SSI Acute on CKD. Now at baseline.   Best practice:  CODE STATUS:Full Diet: NPO except meds GI prophylaxis: protonix 33m  DVT for prophylaxis: hold for active bleeding. SCDs Dispo: resides at skilled facility.    RMitzi Hansen MD INTERNAL MEDICINE RESIDENT PGY-1 PAGER #: [phone]/11/20 7:09 AM

## 2018-11-29 DIAGNOSIS — R040 Epistaxis: Secondary | ICD-10-CM

## 2018-11-29 LAB — BASIC METABOLIC PANEL
Anion gap: 9 (ref 5–15)
BUN: 19 mg/dL (ref 8–23)
CO2: 20 mmol/L — ABNORMAL LOW (ref 22–32)
Calcium: 8.7 mg/dL — ABNORMAL LOW (ref 8.9–10.3)
Chloride: 120 mmol/L — ABNORMAL HIGH (ref 98–111)
Creatinine, Ser: 1.47 mg/dL — ABNORMAL HIGH (ref 0.44–1.00)
GFR calc Af Amer: 43 mL/min — ABNORMAL LOW (ref >60)
GFR calc non Af Amer: 37 mL/min — ABNORMAL LOW (ref >60)
Glucose, Bld: 125 mg/dL — ABNORMAL HIGH (ref 70–99)
Potassium: 3.6 mmol/L (ref 3.5–5.1)
Sodium: 149 mmol/L — ABNORMAL HIGH (ref 135–145)

## 2018-11-29 LAB — CBC
HCT: 26.2 % — ABNORMAL LOW (ref 36.0–46.0)
Hemoglobin: 7.4 g/dL — ABNORMAL LOW (ref 12.0–15.0)
MCH: 27.9 pg (ref 26.0–34.0)
MCHC: 28.2 g/dL — ABNORMAL LOW (ref 30.0–36.0)
MCV: 98.9 fL (ref 80.0–100.0)
Platelets: 69 10*3/uL — ABNORMAL LOW (ref 150–400)
RBC: 2.65 MIL/uL — ABNORMAL LOW (ref 3.87–5.11)
RDW: 22 % — ABNORMAL HIGH (ref 11.5–15.5)
WBC: 5.6 10*3/uL (ref 4.0–10.5)
nRBC: 0 % (ref 0.0–0.2)

## 2018-11-29 LAB — GLUCOSE, CAPILLARY
Glucose-Capillary: 120 mg/dL — ABNORMAL HIGH (ref 70–99)
Glucose-Capillary: 165 mg/dL — ABNORMAL HIGH (ref 70–99)
Glucose-Capillary: 177 mg/dL — ABNORMAL HIGH (ref 70–99)
Glucose-Capillary: 177 mg/dL — ABNORMAL HIGH (ref 70–99)

## 2018-11-29 MED ORDER — SILVER NITRATE-POT NITRATE 75-25 % EX MISC
1.0000 | Freq: Once | CUTANEOUS | Status: DC | PRN
  Filled 2018-11-29: qty 1

## 2018-11-29 MED ORDER — DEXTROSE 5 % IV SOLN
INTRAVENOUS | Status: DC
  Administered 2018-11-29 – 2018-12-02 (×5): via INTRAVENOUS

## 2018-11-29 MED ORDER — LIDOCAINE-EPINEPHRINE (PF) 1 %-1:200000 IJ SOLN
0.0000 mL | Freq: Once | INTRAMUSCULAR | Status: DC | PRN
  Filled 2018-11-29 (×2): qty 30

## 2018-11-29 MED ORDER — LIDOCAINE HCL 2 % EX GEL
1.0000 "application " | Freq: Once | CUTANEOUS | Status: DC | PRN
  Filled 2018-11-29 (×2): qty 4250

## 2018-11-29 MED ORDER — OXYMETAZOLINE HCL 0.05 % NA SOLN
1.0000 | Freq: Once | NASAL | Status: DC | PRN
  Filled 2018-11-29: qty 30

## 2018-11-29 MED ORDER — LIDOCAINE HCL 4 % EX SOLN
0.0000 mL | Freq: Once | CUTANEOUS | Status: DC | PRN
  Filled 2018-11-29: qty 50

## 2018-11-29 MED ORDER — TRIPLE ANTIBIOTIC 3.5-400-5000 EX OINT
1.0000 "application " | TOPICAL_OINTMENT | Freq: Once | CUTANEOUS | Status: DC | PRN
  Filled 2018-11-29 (×3): qty 1

## 2018-11-29 MED ORDER — PANTOPRAZOLE SODIUM 40 MG PO TBEC
40.0000 mg | DELAYED_RELEASE_TABLET | Freq: Every day | ORAL | Status: DC
  Administered 2018-11-29 – 2018-12-07 (×9): 40 mg via ORAL
  Filled 2018-11-29 (×9): qty 1

## 2018-11-29 MED ORDER — LIDOCAINE HCL URETHRAL/MUCOSAL 2 % EX GEL
1.0000 "application " | Freq: Once | CUTANEOUS | Status: DC | PRN
  Filled 2018-11-29: qty 5

## 2018-11-29 NOTE — Progress Notes (Signed)
Patient ID: [name], female   DOB: Mar 02, 1955, 63 y.o.   MRN: [id number]  Asked to return and reevaluate patient for persistent nosebleed.  She has been noted to be picking at her nose on the right side and has had continuous oozing throughout the night and throughout the day intermittently.  On exam, she is severely demented and not able to cooperate at all with the evaluation.  The left nasal cavity looks clear.  The right side is filled with dried and clotted blood.  I was unable to inspect this thoroughly or clean it out.  I was able to apply Afrin spray and then placed a Slimline Merocel packing.  This was then inflated with Afrin.  There was no further bleeding.  Recommend keep this in for 5 days.  Call if there is any additional bleeding.  Otherwise we will plan to remove this on Tuesday.

## 2018-11-29 NOTE — Progress Notes (Signed)
  Speech Language Pathology Treatment: Dysphagia  Patient Details Name: [name] MRN: [id number] DOB: 1955/09/13 Today's Date: 11/29/2018 Time: 3545-6256 SLP Time Calculation (min) (ACUTE ONLY): 12 min  Assessment / Plan / Recommendation Clinical Impression  Skilled treatment session focused on dysphagia goals. SLP recieved pt upright in bed, alert and talkative. SLP provided skilled observation of pt consuming dysphagia 2 (graham cracker in pudding) and dry graham cracker. Pt with good lingual manipulation and mastication with complete oral clearing of graham cracker with pudding. Pt with increased mastication and required liquid wash to clear oral cavity of dry graham cracker. Overall consuming more advanced texture appeared to require more effort. Of note, pt with nose bleed and ENT intervention earlier in morning which may have contributed to increased fatigue/difficulty with more advanced texture. Recommend pt remain on ST caseload to target diet advancement, but in the interm, recommend continuing dysphagia 2 diet.    HPI HPI: Pt is a 63 yo female admitted from SNF with GI bleed and AMS. PMH: small bowel AVMs, COPD, HFrEF, DM 2, chronic hepatitis C, HTN, HLD, and sleep apnea      SLP Plan  Continue with current plan of care       Recommendations  Diet recommendations: Dysphagia 2 (fine chop);Thin liquid Liquids provided via: Cup;Straw Medication Administration: Whole meds with puree Supervision: Full supervision/cueing for compensatory strategies;Staff to assist with self feeding Compensations: Slow rate;Small sips/bites;Minimize environmental distractions Postural Changes and/or Swallow Maneuvers: Seated upright 90 degrees;Upright 30-60 min after meal                Oral Care Recommendations: Staff/trained caregiver to provide oral care;Oral care BID Follow up Recommendations: Skilled Nursing facility SLP Visit Diagnosis: Dysphagia, unspecified (R13.10) Plan:  Continue with current plan of care       [place] 11/29/2018, 2:46 PM

## 2018-11-29 NOTE — Progress Notes (Addendum)
NAME:  [name], MRN:  [id number], DOB:  01-Aug-1955, LOS: 9 ADMISSION DATE:  11/20/2018,  Primary: [place]    Brief History  63 yo female who resides at a skilled nursing facility with PMH of small bowel AVMs, COPD, HFrEF, DM 2, chronic hepatitis C, HTN, HLD, and sleep apnea presented to ED for GI bleeding and altered mental status.  Hgb on admission 6.3 requiring transfusionx2. Previous caps endoscopy signficant for AVMs however when small bowel endoscopy was attempted, patient acutely decompensated. Repeat endoscopy on 11/5 did not reveal etiology of bleed despite continued GI bleeding. Patient also presented with acute encephalopathy which improved with lactulose to decrease ammonia levels.  Subjective  No overnight events. VSS Nasal packing removed yesterday with minor continued bleeding.   Objective   Blood pressure 140/70, pulse 88, temperature 98.5 F (36.9 C), temperature source Axillary, resp. rate 16, weight 107.4 kg, SpO2 97 %.    No intake or output data in the 24 hours ending 11/29/18 0559  Examination: General: Chronically ill-appearing HEENT: No nasal bleed Cardiac: Heart regular rate and rhythm.  Mild bilateral lower extremity peripheral edema Pulmonary: Lung sounds clear to auscultation Abdomen: Soft, nondistended.  Nontender to palpation Neuro: A/O x3. Following commands. EOMs intact. Face symmetric.  Skin: No rash. No jaundice  Consults:  GI-signed off Neurology-signed off Significant Diagnostic Tests:  11/3 CXR-unremarkable 11/3 CT head-no acute changes 11/4 MRI brain: no acute changes 11/5 EEG>>no epileptiform pattern 11/5 endoscopy>>no AVMs 11/5 surgical path>>no H. Pylori 11/9 Tagged RBC scan neg  Micro Data:  11/3 blood culture>> no growth 11/3 urine culture>> no growth  Labs    CBC Latest Ref Rng & Units 11/29/2018 11/28/2018 11/27/2018  WBC 4.0 - 10.5 K/uL 5.6 6.5 5.8  Hemoglobin 12.0 - 15.0 g/dL 7.4(L) 7.9(L) 7.7(L)   Hematocrit 36.0 - 46.0 % 26.2(L) 27.9(L) 27.5(L)  Platelets 150 - 400 K/uL 69(L) 65(L) 65(L)   Stool occult + Ammonia 110>>101>>91>>90>>69>>51>>34 Fibrinogen wnl INR 1.5>>1.4>>1.3 PTT 32 (wnl) Mixing studies wnl Summary  63 yo female with PMH of small bowel AVMs, COPD, HFrEF, DM 2, chronic hepatitis C, HTN, HLD, and sleep apnea presented to ED for GI bleeding and altered mental status.   Assessment & Plan:  Active Problems:   Acute on chronic blood loss anemia   Encephalopathy acute   Gastritis and gastroduodenitis   Altered mental status   Heme positive stool  Acute on chronic blood loss anemia GI bleed. History of gastric and small bowel AVMs  Hgb relatively stable but still slowly trickling down.  Still no clear source of bleeding. Upper endoscopy neg for finding source of bleed. Tagged RBC scan 11/9 neg.  Hgb stable 7.4 Mixing studies wnl Plan Continue to trend h/h. Transfuse for hgb <7 protonix 1m daily  Posterior nasal bleed 2/2 NG trauma. Resolved with nasal packing placement on 11/7 per ENT. Removed rhinorocket 11/11. Nursing reports some continued bleeding and indicates that they left messsage for ENT regarding further management.  No apparent bleeding this morning. 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 No findings consistent with portal hypertension or hepatic gastropathy on endoscopy Thrombocytopenia stable  Acute metabolic encephalopathy. Improving with decrease in ammonia. Plan Continue lactulose. rifaxamin started 11/10. Will need outpatient follow up for liver eval  Physical deconditioning. PT/OT/ST. HFrEF. TEE in October significant for EF of 50-55%. Normal systolic function. Moderate dilation of LA, RA. Moderate  MVR and stenosis. Moderate TVR. Does not appear fluid overloaded at this time. Plan: Daily weights.  Strict I/O. HTN: stable on admission. Continue coreg.  DM II. Stable. Continue SSI Acute on CKD. Now at baseline.  Hypernatremia.  Persist throughout her hospitalization however this resolved with D5 W.  Will restart this at 75 mL/h.  Best practice:  CODE STATUS:Full Diet: NPO except meds GI prophylaxis: protonix 77m  DVT for prophylaxis: hold for active bleeding. SCDs Dispo: resides at skilled facility.    RMitzi Hansen MD INTERNAL MEDICINE RESIDENT PGY-1 PAGER #: [phone]/12/20 5:59 AM

## 2018-11-30 LAB — BASIC METABOLIC PANEL
Anion gap: 9 (ref 5–15)
BUN: 16 mg/dL (ref 8–23)
CO2: 19 mmol/L — ABNORMAL LOW (ref 22–32)
Calcium: 8.5 mg/dL — ABNORMAL LOW (ref 8.9–10.3)
Chloride: 119 mmol/L — ABNORMAL HIGH (ref 98–111)
Creatinine, Ser: 1.38 mg/dL — ABNORMAL HIGH (ref 0.44–1.00)
GFR calc Af Amer: 47 mL/min — ABNORMAL LOW (ref >60)
GFR calc non Af Amer: 40 mL/min — ABNORMAL LOW (ref >60)
Glucose, Bld: 158 mg/dL — ABNORMAL HIGH (ref 70–99)
Potassium: 3.5 mmol/L (ref 3.5–5.1)
Sodium: 147 mmol/L — ABNORMAL HIGH (ref 135–145)

## 2018-11-30 LAB — GLUCOSE, CAPILLARY
Glucose-Capillary: 123 mg/dL — ABNORMAL HIGH (ref 70–99)
Glucose-Capillary: 143 mg/dL — ABNORMAL HIGH (ref 70–99)
Glucose-Capillary: 153 mg/dL — ABNORMAL HIGH (ref 70–99)
Glucose-Capillary: 155 mg/dL — ABNORMAL HIGH (ref 70–99)

## 2018-11-30 LAB — CBC
HCT: 28.2 % — ABNORMAL LOW (ref 36.0–46.0)
Hemoglobin: 7.7 g/dL — ABNORMAL LOW (ref 12.0–15.0)
MCH: 28.4 pg (ref 26.0–34.0)
MCHC: 27.3 g/dL — ABNORMAL LOW (ref 30.0–36.0)
MCV: 104.1 fL — ABNORMAL HIGH (ref 80.0–100.0)
Platelets: 74 10*3/uL — ABNORMAL LOW (ref 150–400)
RBC: 2.71 MIL/uL — ABNORMAL LOW (ref 3.87–5.11)
RDW: 21.5 % — ABNORMAL HIGH (ref 11.5–15.5)
WBC: 5.7 10*3/uL (ref 4.0–10.5)
nRBC: 0 % (ref 0.0–0.2)

## 2018-11-30 MED ORDER — VITAMIN B-12 1000 MCG PO TABS
1000.0000 ug | ORAL_TABLET | Freq: Every day | ORAL | Status: DC
  Administered 2018-11-30 – 2018-12-05 (×6): 1000 ug via ORAL
  Filled 2018-11-30 (×6): qty 1

## 2018-11-30 NOTE — TOC Progression Note (Signed)
Transition of Care [facility]) - Progression Note    Patient Details  Name: KRISTAL PERL MRN: [id number] Date of Birth: November 05, 1955  Transition of Care [facility]) CM/SW [place], [place] Phone Number: 11/30/2018, 5:07 PM  Clinical Narrative:    Acknowledging consult for SNF. TOC team continues to follow for placement back at [facility] where pt is a resident. FL2 complete. Pt will need a new swab for COVID prior to return to SNF.     Expected Discharge Plan: Skilled Nursing Facility Barriers to Discharge: Continued Medical Work up  Expected Discharge Plan and Services Expected Discharge Plan: [place]      Social Determinants of Health (SDOH) Interventions    Readmission Risk Interventions Readmission Risk Prevention Plan 10/26/2018 08/21/2018 09/30/2017  Transportation Screening Complete Complete Complete  PCP or Specialist Appt within 5-7 Days - - Complete  Home Care Screening - - Complete  Medication Review (RN CM) - - Complete  HRI or Home Care Consult Complete Complete -  Social Work Consult for [place] Planning/Counseling Not Complete - -  SW consult not completed comments not recovery care - -  Palliative Care Screening Not Applicable Not Applicable -  Medication Review (RN Care Manager) Referral to Pharmacy Complete -  [place] or Home Care Consult Complete - -  Palliative Care Screening Not Applicable - -  Some recent data might be hidden

## 2018-11-30 NOTE — Progress Notes (Addendum)
NAME:  TIRZA SENTENO, MRN:  [id number], DOB:  06-14-1955, LOS: 47 ADMISSION DATE:  11/20/2018,  Primary: [place]    Brief History  63 yo female who resides at a skilled nursing facility with PMH of small bowel AVMs, COPD, HFrEF, DM 2, chronic hepatitis C, HTN, HLD, and sleep apnea presented to ED for GI bleeding and altered mental status.  Hgb on admission 6.3 requiring transfusionx2. Previous caps endoscopy signficant for AVMs however when small bowel endoscopy was attempted, patient acutely decompensated. Repeat endoscopy on 11/5 did not reveal etiology of bleed despite continued GI bleeding. Patient also presented with acute encephalopathy which improved with lactulose to decrease ammonia levels.  Subjective  No overnight events. VSS Nasal packing removed yesterday with minor continued bleeding.  Speech much clearer today and overall mental status seems improved. Objective   Blood pressure 128/66, pulse 79, temperature 98.1 F (36.7 C), temperature source Axillary, resp. rate 13, weight 107.4 kg, SpO2 100 %.     Intake/Output Summary (Last 24 hours) at 11/30/2018 0547 Last data filed at 11/30/2018 0530 Gross per 24 hour  Intake 1860 ml  Output 600 ml  Net 1260 ml    Examination: General: Chronically ill-appearing HEENT: packing present in right nostril Cardiac: RRR Pulmonary: lungs clear Abdomen: bs active. Diffuse tenderness to palpation Neuro: speech much clearer today. Mentation seems improved overall Skin: no rash  Consults:  GI-signed off Neurology-signed off Significant Diagnostic Tests:  11/3 CXR-unremarkable 11/3 CT head-no acute changes 11/4 MRI brain: no acute changes 11/5 EEG>>no epileptiform pattern 11/5 endoscopy>>no AVMs 11/5 surgical path>>no H. Pylori 11/9 Tagged RBC scan neg  Micro Data:  11/3 blood culture>> no growth 11/3 urine culture>> no growth  Labs    CBC Latest Ref Rng & Units 11/29/2018 11/28/2018 11/27/2018  WBC  4.0 - 10.5 K/uL 5.6 6.5 5.8  Hemoglobin 12.0 - 15.0 g/dL 7.4(L) 7.9(L) 7.7(L)  Hematocrit 36.0 - 46.0 % 26.2(L) 27.9(L) 27.5(L)  Platelets 150 - 400 K/uL 69(L) 65(L) 65(L)   Stool occult + Ammonia 110>>101>>91>>90>>69>>51>>34 Fibrinogen wnl INR 1.5>>1.4>>1.3 PTT 32 (wnl) Mixing studies wnl Summary  63 yo female with PMH of small bowel AVMs, COPD, HFrEF, DM 2, chronic hepatitis C, HTN, HLD, and sleep apnea presented to ED for GI bleeding and altered mental status.   Assessment & Plan:  Active Problems:   Acute on chronic blood loss anemia   Encephalopathy acute   Gastritis and gastroduodenitis   Altered mental status   Heme positive stool   Epistaxis  Acute on chronic blood loss anemia with concomitant macrocytosis GI bleed. History of gastric and small bowel AVMs  Hgb relatively stable but still slowly trickling down.  Still no clear source of bleeding. Upper endoscopy neg for finding source of bleed.  Hgb stable 7.7 Plan Continue to trend h/h. Transfuse for hgb <7 protonix 88m daily b12 10060m  Posterior nasal bleed 2/2 NG trauma. Resolved with nasal packing placement on 11/7 per ENT. Removed nasal packing 11/11. Continued bleeding following removal resulting in ENT placing further packing to be left in place for 5d. 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  Thrombocytopenia stable  Acute metabolic encephalopathy. improving Plan Continue lactulose. rifaxamin started 11/10.  Physical deconditioning. PT/OT/ST. HFrEF. TEE in October significant for EF of 50-55%. Normal systolic function. Moderate dilation of LA, RA. Moderate MVR and stenosis. Moderate TVR. Does not appear fluid overloaded at  this time. Plan: Daily weights.  Strict I/O. HTN: stable on admission. Continue coreg. DM II. Stable. Continue SSI Acute on CKD. Now at baseline.  Hypernatremia.  Persist throughout her  hospitalization due to poor PO intake. Improved with 75cc D5W overnight, will continue this, reassess tomorrow if inadaquate PO intake may need to continue.  Best practice:  CODE STATUS:Full Diet: dysphagia 2 GI prophylaxis: protonix 51m  DVT for prophylaxis: hold for active bleeding. SCDs Dispo: possible discharge in next 1-2 days if patient continues to improve. sw consult placed. Will need nasal packing removed 11/17. Follow up with GI on discharge.   RMitzi Hansen MD INTERNAL MEDICINE RESIDENT PGY-1 PAGER #: [phone]/13/20 5:47 AM

## 2018-11-30 NOTE — Progress Notes (Signed)
Physical Therapy Treatment Patient Details Name: NEVAEH KORTE MRN: [id number] DOB: Apr 27, 1955 Today's Date: 11/30/2018    History of Present Illness 63 yo female admitted to ED on 11/3 from [facility] with heme + stool, AMS suspect due to either toxic metabolic vs hepatic encephalopathy. 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 Enteroscopy procedure 11/5 reveals changes in focal area of fundus of unknown significance, source of bleeding not discovered. Pt with NGT placement, then removed, with periods of bloody vomit and nosebleeds due to NGT trauma (11/7). PMH includes median sternotomy, CABG, L great toe amp 6/7, cervical CA, CHF, hep C, chronic anemia, COPD, DM with polyneuropathy, NSTEMI, upper GI bleed, h/o carpal tunnel syndrome, HNP lumbar.    PT Comments    Pt did AAROM exercises to arms and legs in bed today.  Pt worked on [employer] - as she was laying in bed - incontinent of bladder and bowels and helped clean up bed.  Pt was living in SNF prior - I agree with SNF level of care at DC.  Pt will need lift for OOB.  PT will continue working with her.   Follow Up Recommendations  SNF;Supervision/Assistance - 24 hour     Equipment Recommendations  None recommended by PT    Recommendations for Other Services       Precautions / Restrictions Precautions Precautions: Fall Precaution Comments: R nostril packing placed Restrictions Weight Bearing Restrictions: No    Mobility  Bed Mobility Overal bed mobility: Needs Assistance Bed Mobility: Rolling Rolling: Mod assist         General bed mobility comments: pt worked on rolling back and forth to get her changed.  pt encouraged to use legs to help her push herself over  Transfers                 General transfer comment: did not try getting OOB today due to excessive timing with changing bed etc  Ambulation/Gait             General Gait  Details: unable   Stairs             Wheelchair Mobility    Modified Rankin (Stroke Patients Only)       Balance                                            Cognition Arousal/Alertness: Lethargic Behavior During Therapy: Flat affect Overall Cognitive Status: No family/caregiver present to determine baseline cognitive functioning                                 General Comments: pt very sleepy today - i had to make 2 attempts to find her more alert. pt not talking except yes and no. pt agreeable to exercises in bed - t hen realized she was incontinent of bladder and bowels.  nursing helped to get her changed.  Did UE and LE exercises and bed mobilty today for therapy.      Exercises Other Exercises Other Exercises: supine - I can tell pt tends to stay in bed with arms crossed on her chest -did shoulder and elbow and wrist and grip x 10 reps.  I had pt lay with arms straight several times - pt  tight in biceps and has to actively relax to be able to let arms straighten. Other Exercises: supine =- pt did assisted DF - pt tight in her heelcords - right leg tighter than left.  pt did quad sets and PROM leg exercises - pt with minimal participation wtih leg exercises Other Exercises: Pt instructed in UB ther-ex listed above - esp importance of stretching out her areas that are tight    General Comments General comments (skin integrity, edema, etc.): pt did exercises in the bed - needed cues for stretching and assistive ROM for strenghtening.      Pertinent Vitals/Pain Pain Score: 0-No pain    Home Living                      Prior Function            PT Goals (current goals can now be found in the care plan section) Progress towards PT goals: Not progressing toward goals - comment(pt with minimal active participation in therapy today)    Frequency    Min 2X/week      PT Plan Current plan remains appropriate     Co-evaluation              AM-PAC PT "6 Clicks" Mobility   Outcome Measure  Help needed turning from your back to your side while in a flat bed without using bedrails?: A Lot Help needed moving from lying on your back to sitting on the side of a flat bed without using bedrails?: A Lot Help needed moving to and from a bed to a chair (including a wheelchair)?: Total Help needed standing up from a chair using your arms (e.g., wheelchair or bedside chair)?: Total Help needed to walk in hospital room?: Total Help needed climbing 3-5 steps with a railing? : Total 6 Click Score: 8    End of Session   Activity Tolerance: Patient limited by lethargy Patient left: in bed;with call bell/phone within reach;with nursing/sitter in room Nurse Communication: Mobility status PT Visit Diagnosis: Other abnormalities of gait and mobility (R26.89);Muscle weakness (generalized) (M62.81);Unsteadiness on feet (R26.81)     Time: 1245-1320 PT Time Calculation (min) (ACUTE ONLY): 35 min  Charges:  $Therapeutic Exercise: 23-37 mins                     11/30/2018   Rande Lawman, PT    [name] 11/30/2018, 1:55 PM

## 2018-12-01 DIAGNOSIS — I5020 Unspecified systolic (congestive) heart failure: Secondary | ICD-10-CM

## 2018-12-01 DIAGNOSIS — K7290 Hepatic failure, unspecified without coma: Secondary | ICD-10-CM

## 2018-12-01 DIAGNOSIS — R040 Epistaxis: Secondary | ICD-10-CM

## 2018-12-01 DIAGNOSIS — K922 Gastrointestinal hemorrhage, unspecified: Secondary | ICD-10-CM

## 2018-12-01 DIAGNOSIS — G9341 Metabolic encephalopathy: Secondary | ICD-10-CM

## 2018-12-01 DIAGNOSIS — B182 Chronic viral hepatitis C: Secondary | ICD-10-CM

## 2018-12-01 DIAGNOSIS — E119 Type 2 diabetes mellitus without complications: Secondary | ICD-10-CM

## 2018-12-01 DIAGNOSIS — D696 Thrombocytopenia, unspecified: Secondary | ICD-10-CM

## 2018-12-01 DIAGNOSIS — E870 Hyperosmolality and hypernatremia: Secondary | ICD-10-CM

## 2018-12-01 DIAGNOSIS — Z9889 Other specified postprocedural states: Secondary | ICD-10-CM

## 2018-12-01 DIAGNOSIS — I110 Hypertensive heart disease with heart failure: Secondary | ICD-10-CM

## 2018-12-01 DIAGNOSIS — Q2733 Arteriovenous malformation of digestive system vessel: Secondary | ICD-10-CM

## 2018-12-01 DIAGNOSIS — G4730 Sleep apnea, unspecified: Secondary | ICD-10-CM

## 2018-12-01 DIAGNOSIS — E785 Hyperlipidemia, unspecified: Secondary | ICD-10-CM

## 2018-12-01 DIAGNOSIS — J449 Chronic obstructive pulmonary disease, unspecified: Secondary | ICD-10-CM

## 2018-12-01 DIAGNOSIS — Z79899 Other long term (current) drug therapy: Secondary | ICD-10-CM

## 2018-12-01 LAB — BASIC METABOLIC PANEL
Anion gap: 12 (ref 5–15)
BUN: 13 mg/dL (ref 8–23)
CO2: 19 mmol/L — ABNORMAL LOW (ref 22–32)
Calcium: 8.5 mg/dL — ABNORMAL LOW (ref 8.9–10.3)
Chloride: 114 mmol/L — ABNORMAL HIGH (ref 98–111)
Creatinine, Ser: 1.36 mg/dL — ABNORMAL HIGH (ref 0.44–1.00)
GFR calc Af Amer: 48 mL/min — ABNORMAL LOW (ref >60)
GFR calc non Af Amer: 41 mL/min — ABNORMAL LOW (ref >60)
Glucose, Bld: 178 mg/dL — ABNORMAL HIGH (ref 70–99)
Potassium: 3.4 mmol/L — ABNORMAL LOW (ref 3.5–5.1)
Sodium: 145 mmol/L (ref 135–145)

## 2018-12-01 LAB — CBC
HCT: 28.4 % — ABNORMAL LOW (ref 36.0–46.0)
Hemoglobin: 8.1 g/dL — ABNORMAL LOW (ref 12.0–15.0)
MCH: 28.4 pg (ref 26.0–34.0)
MCHC: 28.5 g/dL — ABNORMAL LOW (ref 30.0–36.0)
MCV: 99.6 fL (ref 80.0–100.0)
Platelets: 72 10*3/uL — ABNORMAL LOW (ref 150–400)
RBC: 2.85 MIL/uL — ABNORMAL LOW (ref 3.87–5.11)
RDW: 21 % — ABNORMAL HIGH (ref 11.5–15.5)
WBC: 6.2 10*3/uL (ref 4.0–10.5)
nRBC: 0 % (ref 0.0–0.2)

## 2018-12-01 LAB — SARS CORONAVIRUS 2 (TAT 6-24 HRS): SARS Coronavirus 2: NEGATIVE

## 2018-12-01 LAB — GLUCOSE, CAPILLARY
Glucose-Capillary: 158 mg/dL — ABNORMAL HIGH (ref 70–99)
Glucose-Capillary: 148 mg/dL — ABNORMAL HIGH (ref 70–99)
Glucose-Capillary: 169 mg/dL — ABNORMAL HIGH (ref 70–99)
Glucose-Capillary: 183 mg/dL — ABNORMAL HIGH (ref 70–99)

## 2018-12-01 NOTE — Progress Notes (Signed)
   Subjective: No acute events overnight. Nursing notes no further bloody bowel movements. She is having approximately 3 BMs per day.  Patient states she is feeling well this morning. Denies any abdominal pain.   Objective:  Vital signs in last 24 hours: Vitals:   11/30/18 2050 11/30/18 2300 12/01/18 0625 12/01/18 0803  BP:  (!) 141/113 129/63 120/72  Pulse:  78 82 74  Resp:  13 20 14   Temp: 97.7 F (36.5 C)   97.7 F (36.5 C)  TempSrc: Axillary   Axillary  SpO2:  96% 95% 96%  Weight:       General: chronically ill-appearing, NAD  CV: RRR Pulm: normal work of breathing; coarse breath sounds; no wheezes  Abd: soft, non-tender, non-distended   Assessment/Plan:  Active Problems:   Acute on chronic blood loss anemia   Encephalopathy acute   Gastritis and gastroduodenitis   Altered mental status   Heme positive stool   Epistaxis  63 yo female with PMH of small bowel AVMs, COPD, HFrEF, DM 2, chronic hepatitis C, HTN, HLD, and sleep apnea presented to ED for GI bleeding and altered mental status.   Acute on chronic blood loss anemia with concomitant macrocytosis GI bleed. History of gastric and small bowel AVMs  Hgb has remained stable for >72 hours without requiring transfusion GI did not find a clear source of bleeding. Upper endoscopy neg for finding source of bleed. Tagged RBC scan was also negative.  - Continue to trend h/h. Transfuse for hgb <7 - protonix 50KX daily  Metabolic encephalopathy secondary to hepatic encephalopathy in the setting of liver fibrosis/probably cirrhosis as suggested on abd u/s from 10/2018 - slowly improving on lactulose and rifaxamin. Continue current regimen with goal of 3 BMs/day - continue PT/OT  Hypernatremia - improving on continuous D5W; will continue for another day. Can likely d/c by tomorrow as her PO intake starts to increase   Posterior nasal bleed 2/2 NG trauma. Resolved with nasal packing placement on 11/7 per ENT. Removed nasal  packing 11/11. Continued bleeding following removal resulting in ENT placing further packing to be left in place for 5d.   Thrombocytopenia stable   Dispo: Anticipated discharge to SNF when medically stable.   [place]ena Nunnery D, DO 12/01/2018, 10:44 AM Pager: [phone]

## 2018-12-01 NOTE — TOC Progression Note (Addendum)
Transition of Care [facility]) - Progression Note    Patient Details  Name: [name] MRN: [id number] Date of Birth: 10-Jul-1955  Transition of Care [facility]) CM/SW [place],  Phone Number: [phone] 12/01/2018, 12:54 PM  Clinical Narrative:     CSW acknowledges potential dc back to [facility] SNF tomorrow 11/15. CSW has paged resident MD to please order an updated COVID test. CSW has informed Freda Munro at [facility] of patient's potential return tomorrow.    Expected Discharge Plan: Skilled Nursing Facility Barriers to Discharge: Continued Medical Work up  Expected Discharge Plan and Services Expected Discharge Plan: [place]                                               Social Determinants of Health (SDOH) Interventions    Readmission Risk Interventions Readmission Risk Prevention Plan 10/26/2018 08/21/2018 09/30/2017  Transportation Screening Complete Complete Complete  PCP or Specialist Appt within 5-7 Days - - Complete  Home Care Screening - - Complete  Medication Review (RN CM) - - Complete  HRI or Home Care Consult Complete Complete -  Social Work Consult for [place] Planning/Counseling Not Complete - -  SW consult not completed comments not recovery care - -  Palliative Care Screening Not Applicable Not Applicable -  Medication Review (RN Care Manager) Referral to Pharmacy Complete -  [place] or Home Care Consult Complete - -  Palliative Care Screening Not Applicable - -  Some recent data might be hidden

## 2018-12-02 ENCOUNTER — Other Ambulatory Visit: Payer: Self-pay

## 2018-12-02 DIAGNOSIS — I452 Bifascicular block: Secondary | ICD-10-CM

## 2018-12-02 LAB — BASIC METABOLIC PANEL
Anion gap: 8 (ref 5–15)
BUN: 14 mg/dL (ref 8–23)
CO2: 19 mmol/L — ABNORMAL LOW (ref 22–32)
Calcium: 8.3 mg/dL — ABNORMAL LOW (ref 8.9–10.3)
Chloride: 116 mmol/L — ABNORMAL HIGH (ref 98–111)
Creatinine, Ser: 1.44 mg/dL — ABNORMAL HIGH (ref 0.44–1.00)
GFR calc Af Amer: 44 mL/min — ABNORMAL LOW (ref >60)
GFR calc non Af Amer: 38 mL/min — ABNORMAL LOW (ref >60)
Glucose, Bld: 169 mg/dL — ABNORMAL HIGH (ref 70–99)
Potassium: 3.4 mmol/L — ABNORMAL LOW (ref 3.5–5.1)
Sodium: 143 mmol/L (ref 135–145)

## 2018-12-02 LAB — CBC
HCT: 28.4 % — ABNORMAL LOW (ref 36.0–46.0)
Hemoglobin: 8.3 g/dL — ABNORMAL LOW (ref 12.0–15.0)
MCH: 28.1 pg (ref 26.0–34.0)
MCHC: 29.2 g/dL — ABNORMAL LOW (ref 30.0–36.0)
MCV: 96.3 fL (ref 80.0–100.0)
Platelets: 86 10*3/uL — ABNORMAL LOW (ref 150–400)
RBC: 2.95 MIL/uL — ABNORMAL LOW (ref 3.87–5.11)
RDW: 20 % — ABNORMAL HIGH (ref 11.5–15.5)
WBC: 8.5 10*3/uL (ref 4.0–10.5)
nRBC: 0 % (ref 0.0–0.2)

## 2018-12-02 LAB — GLUCOSE, CAPILLARY
Glucose-Capillary: 102 mg/dL — ABNORMAL HIGH (ref 70–99)
Glucose-Capillary: 137 mg/dL — ABNORMAL HIGH (ref 70–99)
Glucose-Capillary: 164 mg/dL — ABNORMAL HIGH (ref 70–99)
Glucose-Capillary: 164 mg/dL — ABNORMAL HIGH (ref 70–99)
Glucose-Capillary: 168 mg/dL — ABNORMAL HIGH (ref 70–99)

## 2018-12-02 LAB — MAGNESIUM: Magnesium: 1.7 mg/dL (ref 1.7–2.4)

## 2018-12-02 MED ORDER — POTASSIUM CHLORIDE CRYS ER 20 MEQ PO TBCR
40.0000 meq | EXTENDED_RELEASE_TABLET | Freq: Once | ORAL | Status: AC
  Administered 2018-12-02: 03:00:00 40 meq via ORAL
  Filled 2018-12-02: qty 2

## 2018-12-02 MED ORDER — LACTULOSE 10 GM/15ML PO SOLN
20.0000 g | Freq: Three times a day (TID) | ORAL | Status: DC
  Administered 2018-12-02 – 2018-12-07 (×10): 20 g via ORAL
  Filled 2018-12-02 (×15): qty 30

## 2018-12-02 NOTE — Progress Notes (Signed)
Occupational Therapy Treatment Patient Details Name: [name] MRN: [id number] DOB: August 15, 1955 Today's Date: 12/02/2018    History of present illness 63 yo female admitted to ED on 11/3 from [facility] with heme + stool, AMS suspect due to either toxic metabolic vs hepatic encephalopathy. 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 Enteroscopy procedure 11/5 reveals changes in focal area of fundus of unknown significance, source of bleeding not discovered. Pt with NGT placement, then removed, with periods of bloody vomit and nosebleeds due to NGT trauma (11/7). PMH includes median sternotomy, CABG, L great toe amp 6/7, cervical CA, CHF, hep C, chronic anemia, COPD, DM with polyneuropathy, NSTEMI, upper GI bleed, h/o carpal tunnel syndrome, HNP lumbar.   OT comments  Pt received in bed, asleep but able to arouse. Pt not agreeable to EOB activity, however, with Max encouragement pt agreed. Assist for bed mobility to manage BLE to EOB and elevate trunk to sitting. Pt engaged in oral care while seated up, due to decreased cognition pt unable to perform task appropriately and requires several cues and commands. Pt irritable when returning back into bed, specifically with management of BLE. Continue POC to address established goals. OT will continue to follow.    Follow Up Recommendations  SNF    Equipment Recommendations  None recommended by OT    Recommendations for Other Services      Precautions / Restrictions Precautions Precautions: Fall Precaution Comments: R nostril packing placed Restrictions Weight Bearing Restrictions: No       Mobility Bed Mobility Overal bed mobility: Needs Assistance Bed Mobility: Rolling Rolling: Mod assist   Supine to sit: Mod assist;HOB elevated Sit to supine: Max assist;HOB elevated   General bed mobility comments: pt worked on rolling back and forth to get her changed.  pt  encouraged to use legs to help her push herself over  Transfers                 General transfer comment: session limited to EOB ADL task    Balance Overall balance assessment: Needs assistance Sitting-balance support: Feet supported;Bilateral upper extremity supported Sitting balance-Leahy Scale: Fair Sitting balance - Comments: able to sit EOB without PT assist                                   ADL either performed or assessed with clinical judgement   ADL Overall ADL's : Needs assistance/impaired     Grooming: Oral care;Moderate assistance;Sitting Grooming Details (indicate cue type and reason): Pt not agreeable to EOB activity initially, with Max encouragement pt agreeable. Pt requires Mod A for oral care EOB due to decreased sequencing and problem solving to perform oral care appropriately. Session limited due to declined cognition.                              Functional mobility during ADLs: (PATIENT REFUSED TO ATTEMPT TO SIT EOB) General ADL Comments: session to only oral care. Pt irritable when returning back into bed. Pt given instructions however wanted to continue sitting at EOB and frustrated with therapist to manage BLE back in bed.      Vision       Perception     Praxis      Cognition Arousal/Alertness: Lethargic Behavior During Therapy: Flat affect Overall Cognitive Status: No  family/caregiver present to determine baseline cognitive functioning Area of Impairment: Orientation;Following commands                 Orientation Level: Disoriented to;Place;Time;Situation Current Attention Level: Focused Memory: Decreased short-term memory Following Commands: Follows one step commands inconsistently Safety/Judgement: Decreased awareness of deficits;Decreased awareness of safety Awareness: Intellectual Problem Solving: Slow processing;Decreased initiation;Difficulty sequencing;Requires verbal cues;Requires tactile  cues General Comments: pt very sleepy today - i had to make 2 attempts to find her more alert. pt not talking except yes and no. pt agreeable to exercises in bed - t hen realized she was incontinent of bladder and bowels.  nursing helped to get her changed.  Did UE and LE exercises and bed mobilty today for therapy.        Exercises Low Level/ICU Exercises Elbow Flexion: (Pt does >30 reps independently. Cues for technique.)   Shoulder Instructions       General Comments limited function due to cognition. Pt observed to be irritable with returning back into bed. OT educated pt on importance of being fully in bed.    Pertinent Vitals/ Pain       Pain Assessment: 0-10 Pain Score: 0-No pain Faces Pain Scale: No hurt Pain Location: Flinches with attempt to assist RLE in repositioning. Pain Descriptors / Indicators: Sore  Home Living                                          Prior Functioning/Environment              Frequency  Min 2X/week        Progress Toward Goals  OT Goals(current goals can now be found in the care plan section)  Progress towards OT goals: Progressing toward goals(agreed for EOB activity)  Acute Rehab OT Goals Patient Stated Goal: none stated OT Goal Formulation: Patient unable to participate in goal setting Time For Goal Achievement: 12/12/18 Potential to Achieve Goals: Good ADL Goals Pt Will Perform Grooming: sitting;with set-up;with supervision(With LRAD PRN for improved safety and functional independence) Pt Will Perform Lower Body Bathing: sitting/lateral leans;with min assist;with adaptive equipment(With LRAD PRN for improved safety and functional independence) Pt Will Perform Lower Body Dressing: sit to/from stand;with adaptive equipment;with min assist(With LRAD PRN for improved safety and functional independence)  Plan Discharge plan remains appropriate;Frequency remains appropriate    Co-evaluation                  AM-PAC OT "6 Clicks" Daily Activity     Outcome Measure   Help from another person eating meals?: A Lot Help from another person taking care of personal grooming?: A Lot Help from another person toileting, which includes using toliet, bedpan, or urinal?: Total Help from another person bathing (including washing, rinsing, drying)?: Total Help from another person to put on and taking off regular upper body clothing?: A Lot Help from another person to put on and taking off regular lower body clothing?: Total 6 Click Score: 9    End of Session    OT Visit Diagnosis: Unsteadiness on feet (R26.81);Muscle weakness (generalized) (M62.81)   Activity Tolerance Treatment limited secondary to agitation   Patient Left in bed;with call bell/phone within reach;with bed alarm set   Nurse Communication          Time: 6063-0160 OT Time Calculation (min): 16 min  Charges: OT General Charges $OT Visit:  1 Visit OT Treatments $Self Care/Home Management : 8-22 mins  Minus Breeding, MSOT, OTR/L  Supplemental Rehabilitation Services  [phone]    Marius Ditch 12/02/2018, 10:58 AM

## 2018-12-02 NOTE — Progress Notes (Signed)
Pt Telemetry alarming vtach/vent rhythms. Rhythm shows known RBB. There are some ectopic beats that seems new. Pts breathing is labored. Respiration were in mid teens, now low 20s. Skin diaphoretic. Pt DENIES chest pain. Performing EKG and rapid called to assess.  Axillary temp 98.5.  96P 24R 135/74 95% RA  EKG shows sinus rhythm with premature atrial complexes; right BBB. Left ant fascicular block. QTc-B 457m. Laying patient on left side seemed to cause more electrical activity. Patient now on right side and tele  alarms not ringing. Rhythm seems to be more stable. Rapid recommends talk to MD about checking electrolytes. Last potassium 3.4 and there is not an up to date Mag. Will contact MD about above. Will continue to monitor.    Update: MD returned phone call. MD to place orders.

## 2018-12-02 NOTE — Progress Notes (Addendum)
NAME:  [name], MRN:  [id number], DOB:  07/27/1955, LOS: 32 ADMISSION DATE:  11/20/2018,  Primary: [place]    Brief History  63 yo female who resides at a skilled nursing facility with PMH of small bowel AVMs, COPD, HFrEF, DM 2, chronic hepatitis C, HTN, HLD, and sleep apnea presented to ED for GI bleeding and altered mental status.  Previous caps endoscopy signficant for AVMs however when small bowel endoscopy was attempted, patient acutely decompensated. Push endoscopy on 11/5 did not reveal etiology of bleed despite continued GI bleeding. Total transfusions during hospitalization ~5.  Melena no long appreciated by nurses on hosp day #10 and hgb improved.  Patient also presented with acute encephalopathy and elevated ammonia levels which improved with lactulose and rifaxamin. Encephalopathy improved but has not seemed to entirely resolve at this time. Etiology of elevated ammonia levels remain unclear. Previous abd [place] on 10/10 and CT in June did comment on hepatic findings consistent with early cirrhosis however repeat in October did not comment aside from no focal abnormalities. Additionally, no findings consistent with hepatic gastropathy were noted on endoscopy this admission. She will need further evaluation for cirrhosis after discharge.  Subjective  Nursing notes indicates tele alarming for vtach rhythms however seemed related to patient's position in bed. Labored breathing and diaphoretic at that time as well. Remainder VSS. EKG ordered indicating RBBB and left ant fasicular block. Apparently paged our team asking to check electrolytes however did not inform of tele rhythm or labored breathing/diaphoresis. On review of telemetry, I do not appreciate Vtach episode however PVCs are present. No complaints this morning. Mentation still improving. Objective   Blood pressure 135/74, pulse 93, temperature 98.2 F (36.8 C), temperature source Oral, resp. rate (!) 22, weight  105.6 kg, SpO2 95 %.     Intake/Output Summary (Last 24 hours) at 12/02/2018 0813 Last data filed at 12/01/2018 1900 Gross per 24 hour  Intake 638 ml  Output 150 ml  Net 488 ml    Examination: General: chronically ill appearing. In NAD Cardiac: RRR.  Pulm: lung sounds clear Abd: bowel sounds active. Neuro: a/o x3. CN II-XII grossly intact. More verbal this morning. Skin: no rash  Consults:  GI-signed off Neurology-signed off Significant Diagnostic Tests:  11/3 CT head-no acute changes 11/4 MRI brain: no acute changes 11/5 EEG>>no epileptiform pattern 11/5 endoscopy>>no AVMs 11/5 surgical path>>no H. Pylori 11/9 Tagged RBC scan neg  Labs    CBC Latest Ref Rng & Units 12/02/2018 12/01/2018 11/30/2018  WBC 4.0 - 10.5 K/uL 8.5 6.2 5.7  Hemoglobin 12.0 - 15.0 g/dL 8.3(L) 8.1(L) 7.7(L)  Hematocrit 36.0 - 46.0 % 28.4(L) 28.4(L) 28.2(L)  Platelets 150 - 400 K/uL 86(L) 72(L) 74(L)   BMP Latest Ref Rng & Units 12/02/2018 12/01/2018 11/30/2018  Glucose 70 - 99 mg/dL 169(H) 178(H) 158(H)  BUN 8 - 23 mg/dL 14 13 16   Creatinine 0.44 - 1.00 mg/dL 1.44(H) 1.36(H) 1.38(H)  Sodium 135 - 145 mmol/L 143 145 147(H)  Potassium 3.5 - 5.1 mmol/L 3.4(L) 3.4(L) 3.5  Chloride 98 - 111 mmol/L 116(H) 114(H) 119(H)  CO2 22 - 32 mmol/L 19(L) 19(L) 19(L)  Calcium 8.9 - 10.3 mg/dL 8.3(L) 8.5(L) 8.5(L)    Stool occult + Ammonia 110>>101>>91>>90>>69>>51>>34 Fibrinogen wnl INR 1.5>>1.4>>1.3; PTT 32 (wnl); Mixing studies wnl Summary  63 yo female with PMH of small bowel AVMs, COPD, HFrEF, DM 2, chronic hepatitis C, HTN, HLD, and sleep apnea presented to ED for GI bleeding and altered  mental status.   Assessment & Plan:  Active Problems:   Acute on chronic blood loss anemia   Encephalopathy acute   Gastritis and gastroduodenitis   Altered mental status   Heme positive stool   Epistaxis  Acute on chronic blood loss anemia with concomitant macrocytosis GI bleed. Resolved/improving. History  of gastric and small bowel AVMs . Hgb improving now. Nursing denies any further apparent melena Plan protonix 72m daily b12 10068m  Posterior nasal bleed 2/2 NG trauma. Resolved with nasal packing placement on 11/7 per ENT. Removed nasal packing 11/11. 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  Thrombocytopenia improving Acute metabolic encephalopathy. improving Plan Continue lactulose. rifaxamin started 11/10.  Physical deconditioning. PT/OT/ST. HFrEF. TEE in October significant for EF of 50-55%. Normal systolic function. Moderate dilation of LA, RA. Moderate MVR and stenosis. Moderate TVR. Does not appear fluid overloaded at this time. Plan: Daily weights.  Strict I/O. HTN: stable on admission. Continue coreg. DM II. Stable. Continue SSI Acute on CKD. Now at baseline.  Hypernatremia.  Resolved.  Best practice:  CODE STATUS:Full Diet: dysphagia 2 GI prophylaxis: protonix 404mDVT for prophylaxis: hold for active bleeding. SCDs Dispo: possible discharge to [facility] in next 1-2 days if patient continues to improve. Will need nasal packing removed 11/17. Follow up with GI on discharge.   RYLMitzi HansenD INTERNAL MEDICINE RESIDENT PGY-1 PAGER #: [phone]/15/20 8:13 AM  Dr. LanMadilyn Fireman resume care in AM 11/16

## 2018-12-03 DIAGNOSIS — I10 Essential (primary) hypertension: Secondary | ICD-10-CM

## 2018-12-03 DIAGNOSIS — R339 Retention of urine, unspecified: Secondary | ICD-10-CM

## 2018-12-03 LAB — CBC
HCT: 28.4 % — ABNORMAL LOW (ref 36.0–46.0)
Hemoglobin: 8.3 g/dL — ABNORMAL LOW (ref 12.0–15.0)
MCH: 28.3 pg (ref 26.0–34.0)
MCHC: 29.2 g/dL — ABNORMAL LOW (ref 30.0–36.0)
MCV: 96.9 fL (ref 80.0–100.0)
Platelets: 83 10*3/uL — ABNORMAL LOW (ref 150–400)
RBC: 2.93 MIL/uL — ABNORMAL LOW (ref 3.87–5.11)
RDW: 19.5 % — ABNORMAL HIGH (ref 11.5–15.5)
WBC: 6.4 10*3/uL (ref 4.0–10.5)
nRBC: 0 % (ref 0.0–0.2)

## 2018-12-03 LAB — BASIC METABOLIC PANEL
Anion gap: 11 (ref 5–15)
BUN: 17 mg/dL (ref 8–23)
CO2: 19 mmol/L — ABNORMAL LOW (ref 22–32)
Calcium: 8.4 mg/dL — ABNORMAL LOW (ref 8.9–10.3)
Chloride: 115 mmol/L — ABNORMAL HIGH (ref 98–111)
Creatinine, Ser: 1.59 mg/dL — ABNORMAL HIGH (ref 0.44–1.00)
GFR calc Af Amer: 39 mL/min — ABNORMAL LOW (ref >60)
GFR calc non Af Amer: 34 mL/min — ABNORMAL LOW (ref >60)
Glucose, Bld: 113 mg/dL — ABNORMAL HIGH (ref 70–99)
Potassium: 3.9 mmol/L (ref 3.5–5.1)
Sodium: 145 mmol/L (ref 135–145)

## 2018-12-03 LAB — GLUCOSE, CAPILLARY
Glucose-Capillary: 109 mg/dL — ABNORMAL HIGH (ref 70–99)
Glucose-Capillary: 111 mg/dL — ABNORMAL HIGH (ref 70–99)
Glucose-Capillary: 164 mg/dL — ABNORMAL HIGH (ref 70–99)
Glucose-Capillary: 151 mg/dL — ABNORMAL HIGH (ref 70–99)

## 2018-12-03 NOTE — TOC Progression Note (Addendum)
Transition of Care [facility]) - Progression Note    Patient Details  Name: [name] MRN: [id number] Date of Birth: [birth year]  Transition of [facility]) CM/SW [place], [place] Phone Number: 12/03/2018, 2:30 PM  Clinical Narrative:     CSW paged MD, patient not currently medically stable. MD looking at end of week of being back to baseline. MD aware of needing updated COVID.   CSW updated Freda Munro from [place].   Will order COVID before being ready for discharge. CSW will continue to follow and assist as needed.   Expected Discharge Plan: Skilled Nursing Facility Barriers to Discharge: Continued Medical Work up  Expected Discharge Plan and Services Expected Discharge Plan: [employer]                                               Social Determinants of Health (SDOH) Interventions    Readmission Risk Interventions Readmission Risk Prevention Plan 10/26/2018 08/21/2018 09/30/2017  Transportation Screening Complete Complete Complete  PCP or Specialist Appt within 5-7 Days - - Complete  Home Care Screening - - Complete  Medication Review (RN CM) - - Complete  HRI or Home Care Consult Complete Complete -  Social Work Consult for [place] Planning/Counseling Not Complete - -  SW consult not completed comments not recovery care - -  Palliative Care Screening Not Applicable Not Applicable -  Medication Review (RN Care Manager) Referral to Pharmacy Complete -  [place] or Home Care Consult Complete - -  Palliative Care Screening Not Applicable - -  Some recent data might be hidden

## 2018-12-03 NOTE — Progress Notes (Signed)
Straight cathed pt. Output was 468m. Will continue to monitor.

## 2018-12-03 NOTE — Progress Notes (Addendum)
Pt has not voided on night shift. Bed dry. Bladder scan revealed 375m of fluid. Pt denies pressure or pain. Negative for abdominal distention. Last documented Urine output was on 11/15 @ 1500. Will call MD with info above.  Spoke with night shift MD. No orders received. MD will pass the information above on to Day Shift treatment team.

## 2018-12-03 NOTE — Progress Notes (Signed)
Bladder scanned pt. Scan showed 425 ml. MD paged. Awaiting orders.

## 2018-12-03 NOTE — Progress Notes (Addendum)
Subjective: Lying comfortably in bed.  She reports not eating and drinking normally as she needs assistance with obtaining food and beverage.  She denies chest pain, shortness of breath, abdominal pain.  She denies any bleeding from her nose or her rectum.  She has no other complaints or concerns.   Consults: ENT, Dr. Constance Holster  Objective:  Vital signs in last 24 hours: Vitals:   12/02/18 2120 12/03/18 0006 12/03/18 0417 12/03/18 0500  BP: 121/67 121/69 122/86   Pulse: 67     Resp: 17     Temp:  98.1 F (36.7 C) 98.2 F (36.8 C)   TempSrc:  Oral Oral   SpO2: 95%     Weight:    108.2 kg    Physical Exam  Constitutional: No distress.  Cardiovascular: Normal rate, regular rhythm, normal heart sounds and intact distal pulses.  No murmur heard. Pulmonary/Chest: Effort normal and breath sounds normal. No respiratory distress. She exhibits no tenderness.  Abdominal: Soft. Bowel sounds are normal. She exhibits no distension. There is no abdominal tenderness.  Musculoskeletal: Normal range of motion.  Neurological: She is alert.  Only oriented to self; unable to evaluate for asterixis as patient unable to follow commands appropriately  Skin: Skin is warm and dry. She is not diaphoretic. No erythema.  Psychiatric: Affect normal.  Nursing note and vitals reviewed.   I/Os:   Intake/Output Summary (Last 24 hours) at 12/03/2018 0644 Last data filed at 12/02/2018 1418 Gross per 24 hour  Intake 240 ml  Output -  Net 240 ml   Labs:  CBC Latest Ref Rng & Units 12/02/2018 12/01/2018 11/30/2018  WBC 4.0 - 10.5 K/uL 8.5 6.2 5.7  Hemoglobin 12.0 - 15.0 g/dL 8.3(L) 8.1(L) 7.7(L)  Hematocrit 36.0 - 46.0 % 28.4(L) 28.4(L) 28.2(L)  Platelets 150 - 400 K/uL 86(L) 72(L) 74(L)   BMP Latest Ref Rng & Units 12/02/2018 12/01/2018 11/30/2018  Glucose 70 - 99 mg/dL 169(H) 178(H) 158(H)  BUN 8 - 23 mg/dL 14 13 16   Creatinine 0.44 - 1.00 mg/dL 1.44(H) 1.36(H) 1.38(H)  Sodium 135 - 145 mmol/L 143  145 147(H)  Potassium 3.5 - 5.1 mmol/L 3.4(L) 3.4(L) 3.5  Chloride 98 - 111 mmol/L 116(H) 114(H) 119(H)  CO2 22 - 32 mmol/L 19(L) 19(L) 19(L)  Calcium 8.9 - 10.3 mg/dL 8.3(L) 8.5(L) 8.5(L)   Assessment/Plan:  Assessment: Ms. Feigel is a 63 year old female with a past medical history of small bowel AVMs, COPD, HFrEF, DM2, HTN, HLD and sleep apnea who presented to the ED from a nursing facility for GI bleed and altered mental status now status post small bowel endoscopy without evidence of source of bleed and 5 units of packed red blood cells without melena currently and with stable hemoglobin.  Altered mental status consistent with acute encephalopathy in setting of elevated ammonia, improved with lactulose and rifaximin, however, mental status is not at baseline.    Plan: Active Problems:   Encephalopathy acute/AMS   Very fibrosis/probable cirrhosis -Encephalopathy improving; mental status not yet at baseline  Plan:  -continue lactulose and rifaximin -Continue PT/OT/ST    Epistaxis -Posterior nasal bleed secondary to NG trauma resolved with nasal packing placement on 11/7 per ENT, nasal packing removed 11/11 with continued bleeding following removal resulting in replacement packing to be left in place for 5 days, with removal on 11/18 -Packing in place without evidence of overflow bleeding  Plan: -Continue to monitor -Packing removal tomorrow per ENT    Acute on chronic blood loss  anemia/Heme positive stool/Gastritis and gastroduodenitis -CBC with stable hemoglobin of 8.3 -Nursing denies any further melena  Plan: -Continue Protonix 40 mg daily -Continue B12 1000 mcg daily    Urinary retention versus dehydration -Patient without spontaneous urination since 11/15 at 3 PM; bladder scan with 400 mL -This could be due to her mental status such that she is unaware if she needs to urinate versus her mental status impedes her ability to eat and drink normally -Patient has minimal  intake which may be contributing to lack of output -We will encourage p.o. intake and requested nursing assistance with feeds  Plan: -Increased p.o. intake and nursing assistance with p.o. intake -One-time in and out cath -Q shift bladder scans  HFrEF: TEE in October with EF of 50 to 55%.  Moderate dilation of LA, RA.  Moderate MVR and stenosis.  Moderate TVR.  Does not appear fluid overloaded at this time.  Plan: -Daily weights and strict I's and O's  HTN: Continue Coreg  DM2: Continue SSI  Acute on CKD: Creatinine slightly increased from baseline around 1.4-1.59, in keeping with concern that patient is dehydrated.  We will continue to encourage p.o. intake and assistance with p.o. intake and follow-up creatinine with BMP.  Dispo: Anticipated discharge in approximately pending clinical course.  Al Decant, MD 12/03/2018, 6:33 AM Pager: [phone]

## 2018-12-04 LAB — CBC
HCT: 27.6 % — ABNORMAL LOW (ref 36.0–46.0)
Hemoglobin: 7.9 g/dL — ABNORMAL LOW (ref 12.0–15.0)
MCH: 27.4 pg (ref 26.0–34.0)
MCHC: 28.6 g/dL — ABNORMAL LOW (ref 30.0–36.0)
MCV: 95.8 fL (ref 80.0–100.0)
Platelets: 98 10*3/uL — ABNORMAL LOW (ref 150–400)
RBC: 2.88 MIL/uL — ABNORMAL LOW (ref 3.87–5.11)
RDW: 19 % — ABNORMAL HIGH (ref 11.5–15.5)
WBC: 6.7 10*3/uL (ref 4.0–10.5)
nRBC: 0 % (ref 0.0–0.2)

## 2018-12-04 LAB — BASIC METABOLIC PANEL
Anion gap: 9 (ref 5–15)
BUN: 19 mg/dL (ref 8–23)
CO2: 18 mmol/L — ABNORMAL LOW (ref 22–32)
Calcium: 8.3 mg/dL — ABNORMAL LOW (ref 8.9–10.3)
Chloride: 114 mmol/L — ABNORMAL HIGH (ref 98–111)
Creatinine, Ser: 1.48 mg/dL — ABNORMAL HIGH (ref 0.44–1.00)
GFR calc Af Amer: 43 mL/min — ABNORMAL LOW (ref >60)
GFR calc non Af Amer: 37 mL/min — ABNORMAL LOW (ref >60)
Glucose, Bld: 131 mg/dL — ABNORMAL HIGH (ref 70–99)
Potassium: 4.2 mmol/L (ref 3.5–5.1)
Sodium: 141 mmol/L (ref 135–145)

## 2018-12-04 LAB — GLUCOSE, CAPILLARY
Glucose-Capillary: 112 mg/dL — ABNORMAL HIGH (ref 70–99)
Glucose-Capillary: 131 mg/dL — ABNORMAL HIGH (ref 70–99)
Glucose-Capillary: 127 mg/dL — ABNORMAL HIGH (ref 70–99)
Glucose-Capillary: 120 mg/dL — ABNORMAL HIGH (ref 70–99)

## 2018-12-04 MED ORDER — ACETAMINOPHEN 325 MG PO TABS
650.0000 mg | ORAL_TABLET | Freq: Four times a day (QID) | ORAL | Status: DC | PRN
  Administered 2018-12-04: 06:00:00 650 mg via ORAL
  Filled 2018-12-04: qty 2

## 2018-12-04 MED ORDER — LACTATED RINGERS IV SOLN: INTRAVENOUS | Status: AC

## 2018-12-04 NOTE — Progress Notes (Signed)
  Speech Language Pathology Treatment: Dysphagia  Patient Details Name: [name] MRN: [id number] DOB: June 28, 1955 Today's Date: 12/04/2018 Time: 1325-1340 SLP Time Calculation (min) (ACUTE ONLY): 15 min  Assessment / Plan / Recommendation Clinical Impression  Pt seen for diet tolerance as well as potential for diet advancement per MD request. SLP provided skilled observation during lunch meal with prolonged mastication and oral residue noted with chopped foods - both meats and soft fruits. A thin liquid wash helped but did not clear all residue. Additional cues for clearance were needed. When asked about the chopped foods, pt says that they "come easier" and that she prefers them to more solid foods at the moment. I agree that it would likely be effortful to advance her diet, and that this would be better for energy conservation and oral clearance. Per pt preference and SLP recommendation, will keep at Dys 2 diet and thin liquids for now but will continue to follow for potential to advance.    HPI HPI: Pt is a 63 yo female admitted from SNF with GI bleed and AMS. PMH: small bowel AVMs, COPD, HFrEF, DM 2, chronic hepatitis C, HTN, HLD, and sleep apnea      SLP Plan  Continue with current plan of care       Recommendations  Diet recommendations: Dysphagia 2 (fine chop);Thin liquid Liquids provided via: Cup;Straw Medication Administration: Whole meds with puree Supervision: Full supervision/cueing for compensatory strategies;Staff to assist with self feeding Compensations: Slow rate;Small sips/bites;Minimize environmental distractions Postural Changes and/or Swallow Maneuvers: Seated upright 90 degrees;Upright 30-60 min after meal                Oral Care Recommendations: Oral care BID Follow up Recommendations: Skilled Nursing facility SLP Visit Diagnosis: Dysphagia, unspecified (R13.10) Plan: Continue with current plan of care       GO                [name]  [name] 12/04/2018, 2:04 PM  [name], M.A. [place] Acute Environmental education officer [phone] Office [phone]

## 2018-12-04 NOTE — Progress Notes (Signed)
   12/03/18 1934  MEWS Score  Resp 17  ECG Heart Rate 78  Pulse Rate 77  BP 133/75  Temp 98.1 F (36.7 C)  Level of Consciousness Alert  SpO2 98 %  O2 Device Room Air  MEWS Score  MEWS RR 0  MEWS Pulse 0  MEWS Systolic 0  MEWS LOC 0  MEWS Temp 0  MEWS Score 0  MEWS Score Color Green  MEWS Assessment  Is this an acute change? No   Lab Results WBC  Date/Time Value Ref Range Status  12/03/2018 07:07 AM 6.4 4.0 - 10.5 K/uL Final  12/02/2018 02:48 AM 8.5 4.0 - 10.5 K/uL Final  12/01/2018 02:57 AM 6.2 4.0 - 10.5 K/uL Final   Neutrophil %  Date/Time Value Ref Range Status  08/03/2013 09:38 PM 58.7 % Final   Neutrophils Relative %  Date/Time Value Ref Range Status  11/20/2018 12:30 PM 77 % Final  10/27/2018 03:50 AM 87 % Final  10/25/2018 08:38 PM 90 % Final   pCO2 arterial  Date/Time Value Ref Range Status  11/24/2018 10:20 AM 38.2 32.0 - 48.0 mmHg Final  11/22/2018 01:20 PM 33.7 32.0 - 48.0 mmHg Final  12/19/2016 10:08 AM 31 (L) 32.0 - 48.0 mmHg Final   Lactic Acid, Venous  Date/Time Value Ref Range Status  10/28/2018 12:47 AM 1.6 0.5 - 1.9 mmol/L Final    Comment:    Performed at [facility], [address] [place], [place] [address]  10/25/2018 10:11 PM 3.9 (HH) 0.5 - 1.9 mmol/L Final    Comment:    CRITICAL RESULT CALLED TO, READ BACK BY AND VERIFIED WITH GRACIE [facility] AT 2336 ON 10/25/18 RWW Performed at [facility], [address] [place], [place] [address]   10/25/2018 08:38 PM 6.1 (HH) 0.5 - 1.9 mmol/L Final    Comment:    CRITICAL RESULT CALLED TO, READ BACK BY AND VERIFIED WITH GRACIE [facility] @2114  10/25/18 MJU Performed at [facility], [address] [place], [place] [address]    No results found for: [id number]

## 2018-12-04 NOTE — Plan of Care (Signed)
  Problem: Education: Goal: Knowledge of General Education information will improve Description: Including pain rating scale, medication(s)/side effects and non-pharmacologic comfort measures Outcome: Not Progressing Note: Confusion   Problem: Health Behavior/Discharge Planning: Goal: Ability to manage health-related needs will improve Outcome: Not Progressing Note: Confusion/immobility   Problem: Clinical Measurements: Goal: Ability to maintain clinical measurements within normal limits will improve Outcome: Progressing Goal: Will remain free from infection Outcome: Progressing Goal: Diagnostic test results will improve Outcome: Progressing Goal: Respiratory complications will improve Outcome: Progressing Goal: Cardiovascular complication will be avoided Outcome: Progressing   Problem: Activity: Goal: Risk for activity intolerance will decrease Outcome: Not Progressing Note: Immobility   Problem: Nutrition: Goal: Adequate nutrition will be maintained Outcome: Progressing   Problem: Coping: Goal: Level of anxiety will decrease Outcome: Progressing   Problem: Elimination: Goal: Will not experience complications related to bowel motility Outcome: Progressing Goal: Will not experience complications related to urinary retention Outcome: Progressing   Problem: Pain Managment: Goal: General experience of comfort will improve Outcome: Progressing   Problem: Safety: Goal: Ability to remain free from injury will improve Outcome: Progressing   Problem: Skin Integrity: Goal: Risk for impaired skin integrity will decrease Outcome: Not Progressing Note: Immobility   Problem: Education: Goal: Ability to identify signs and symptoms of gastrointestinal bleeding will improve Outcome: Not Progressing Note: Confusion   Problem: Bowel/Gastric: Goal: Will show no signs and symptoms of gastrointestinal bleeding Outcome: Progressing   Problem: Fluid Volume: Goal: Will show  no signs and symptoms of excessive bleeding Outcome: Progressing   Problem: Clinical Measurements: Goal: Complications related to the disease process, condition or treatment will be avoided or minimized Outcome: Progressing

## 2018-12-04 NOTE — Progress Notes (Signed)
Occupational Therapy Treatment Patient Details Name: ROLONDA PONTARELLI MRN: [id number] DOB: 11-10-1955 Today's Date: 12/04/2018    History of present illness 63 yo female admitted to ED on 11/3 from [facility] with heme + stool, AMS suspect due to either toxic metabolic vs hepatic encephalopathy. 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 Enteroscopy procedure 11/5 reveals changes in focal area of fundus of unknown significance, source of bleeding not discovered. Pt with NGT placement, then removed, with periods of bloody vomit and nosebleeds due to NGT trauma (11/7). PMH includes median sternotomy, CABG, L great toe amp 6/7, cervical CA, CHF, hep C, chronic anemia, COPD, DM with polyneuropathy, NSTEMI, upper GI bleed, h/o carpal tunnel syndrome, HNP lumbar.   OT comments  Pt supine in bed and agreeable to OT session. Pt required maxA to progress to sitting EOB, minA for stability sitting EOB while engaging in ADL. Pt tolerated sitting EOB <67mn with frequent vc to maintain upright posture. Pt will continue to benefit from skilled OT services to maximize safety and independence with ADL/IADL and functional mobility. Will continue to follow acutely and progress as tolerated.    Follow Up Recommendations  SNF    Equipment Recommendations  None recommended by OT    Recommendations for Other Services      Precautions / Restrictions Precautions Precautions: Fall Restrictions Weight Bearing Restrictions: No       Mobility Bed Mobility Overal bed mobility: Needs Assistance Bed Mobility: Rolling;Supine to Sit;Sit to Supine Rolling: Max assist   Supine to sit: Max assist;HOB elevated Sit to supine: Max assist;HOB elevated   General bed mobility comments: HOB significantly elevated with maxA to progress hips to EOB;BLE management for all aspects of bed mobiltiy  Transfers                 General transfer comment:  deferred    Balance Overall balance assessment: Needs assistance Sitting-balance support: Feet supported;No upper extremity supported Sitting balance-Leahy Scale: Fair Sitting balance - Comments: minA for stability sitting EOB while engaging in ADL;intermittent no physcial assistance provided                                   ADL either performed or assessed with clinical judgement   ADL Overall ADL's : Needs assistance/impaired     Grooming: Moderate assistance;Sitting   Upper Body Bathing: Maximal assistance;Sitting               Toilet Transfer: Maximal assistance;Total assistance Toilet Transfer Details (indicate cue type and reason): simulated rolling in the bed           General ADL Comments: sat EOB with minA for balance and stability;able to tolerate sitting EOB ~7 minutes with frequent vc to maintain upright posture;     Vision       Perception     Praxis      Cognition Arousal/Alertness: Awake/alert Behavior During Therapy: Flat affect Overall Cognitive Status: No family/caregiver present to determine baseline cognitive functioning Area of Impairment: Following commands;Safety/judgement;Awareness;Problem solving                       Following Commands: Follows one step commands inconsistently Safety/Judgement: Decreased awareness of deficits;Decreased awareness of safety Awareness: Intellectual Problem Solving: Slow processing;Decreased initiation;Difficulty sequencing;Requires verbal cues;Requires tactile cues General Comments: pt not verbalizing much during today's session but  will respond with head nods "yes" and "no";pt requires frequent vc during ADL and vc for initiation;pt requires increased processing time        Exercises     Shoulder Instructions       General Comments VSS    Pertinent Vitals/ Pain       Pain Assessment: Faces Faces Pain Scale: Hurts a little bit Pain Location: Flinches with attempt to  assist RLE in repositioning. Pain Descriptors / Indicators: Sore Pain Intervention(s): Limited activity within patient's tolerance;Monitored during session  Home Living                                          Prior Functioning/Environment              Frequency  Min 2X/week        Progress Toward Goals  OT Goals(current goals can now be found in the care plan section)  Progress towards OT goals: Progressing toward goals  Acute Rehab OT Goals Patient Stated Goal: none stated OT Goal Formulation: Patient unable to participate in goal setting Time For Goal Achievement: 12/12/18 Potential to Achieve Goals: Good ADL Goals Pt Will Perform Grooming: sitting;with set-up;with supervision Pt Will Perform Lower Body Bathing: sitting/lateral leans;with min assist;with adaptive equipment Pt Will Perform Lower Body Dressing: sit to/from stand;with adaptive equipment;with min assist  Plan Discharge plan remains appropriate;Frequency remains appropriate    Co-evaluation                 AM-PAC OT "6 Clicks" Daily Activity     Outcome Measure   Help from another person eating meals?: A Lot Help from another person taking care of personal grooming?: A Lot Help from another person toileting, which includes using toliet, bedpan, or urinal?: Total Help from another person bathing (including washing, rinsing, drying)?: Total Help from another person to put on and taking off regular upper body clothing?: A Lot Help from another person to put on and taking off regular lower body clothing?: Total 6 Click Score: 9    End of Session Equipment Utilized During Treatment: Gait belt;Rolling walker  OT Visit Diagnosis: Unsteadiness on feet (R26.81);Muscle weakness (generalized) (M62.81)   Activity Tolerance Patient tolerated treatment well   Patient Left in bed;with call bell/phone within reach;with bed alarm set   Nurse Communication Mobility status         Time: 0301-3143 OT Time Calculation (min): 19 min  Charges: OT General Charges $OT Visit: 1 Visit OT Treatments $Self Care/Home Management : 8-22 mins  Dorinda Hill OTR/L [place] Office: [place] 12/04/2018, 1:03 PM

## 2018-12-04 NOTE — Progress Notes (Signed)
Patient ID: [name], female   DOB: Apr 01, 1955, 63 y.o.   MRN: [id number] Packing removed, no active bleeding.  Follow-up as needed.

## 2018-12-04 NOTE — Progress Notes (Signed)
Subjective: Lying comfortably in bed.  She reports not eating and drinking normally as she doesn't have an appetite.  She denies chest pain, shortness of breath, abdominal pain.  She denies any bleeding from her nose or her rectum. She denies anxiety and depression. She has no other complaints or concerns.  Consults: ENT, Dr. Constance Holster  Objective:  Vital signs in last 24 hours: Vitals:   12/04/18 0016 12/04/18 0409 12/04/18 0757 12/04/18 1000  BP: 128/77 (!) 144/77 139/75 (!) 153/87  Pulse: 71 72 72 79  Resp: 15 16 12    Temp: 98 F (36.7 C) 97.9 F (36.6 C) (!) 97.5 F (36.4 C)   TempSrc: Oral Oral Oral   SpO2: 96% 95% 95%   Weight:  106.8 kg      Physical Exam  Constitutional: No distress.  Cardiovascular: Normal rate, regular rhythm, normal heart sounds and intact distal pulses.  No murmur heard. Pulmonary/Chest: Effort normal and breath sounds normal. No respiratory distress. She exhibits no tenderness.  Abdominal: Soft. Bowel sounds are normal. She exhibits no distension. There is no abdominal tenderness.  Musculoskeletal: Normal range of motion.  Neurological: She is alert.  Only oriented to self; unable to evaluate for asterixis as patient appears unwilling to follow commands   Skin: Skin is warm and dry. She is not diaphoretic. No erythema.  Psychiatric:  Dysphoric affect; lack of motivation  Nursing note and vitals reviewed.   I/Os:   Intake/Output Summary (Last 24 hours) at 12/04/2018 1118 Last data filed at 12/03/2018 1858 Gross per 24 hour  Intake 660 ml  Output 450 ml  Net 210 ml   Labs:  CBC Latest Ref Rng & Units 12/04/2018 12/03/2018 12/02/2018  WBC 4.0 - 10.5 K/uL 6.7 6.4 8.5  Hemoglobin 12.0 - 15.0 g/dL 7.9(L) 8.3(L) 8.3(L)  Hematocrit 36.0 - 46.0 % 27.6(L) 28.4(L) 28.4(L)  Platelets 150 - 400 K/uL 98(L) 83(L) 86(L)   BMP Latest Ref Rng & Units 12/04/2018 12/03/2018 12/02/2018  Glucose 70 - 99 mg/dL 131(H) 113(H) 169(H)  BUN 8 - 23 mg/dL 19 17  14   Creatinine 0.44 - 1.00 mg/dL 1.48(H) 1.59(H) 1.44(H)  Sodium 135 - 145 mmol/L 141 145 143  Potassium 3.5 - 5.1 mmol/L 4.2 3.9 3.4(L)  Chloride 98 - 111 mmol/L 114(H) 115(H) 116(H)  CO2 22 - 32 mmol/L 18(L) 19(L) 19(L)  Calcium 8.9 - 10.3 mg/dL 8.3(L) 8.4(L) 8.3(L)   Assessment/Plan:  Assessment: [name] is a 63 year old female with a past medical history of small bowel AVMs, COPD, HFrEF, DM2, HTN, HLD and sleep apnea who presented to the ED from a nursing facility for GI bleed and altered mental status now status post 5 units PRBCs and small bowel endoscopy without evidence of source of bleed without additional bleeding and with stable hemoglobin who appears much improved from admission. AMS presumed to be secondary to acute hepatic encephalopathy in setting of elevated ammonia which improved with lactulose and rifaximin. Unfortunately mental status is not at baseline and patient continues to be oriented only to self with a dysphoric affect.  Plan: Active Problems:   Encephalopathy acute/AMS   Probable cirrhosis -Encephalopathy improved but has reached a plateau -Patient much improved from admission but continues to be only oriented to self, she is not eating or drinking due to lack of appetite, and she appears dysphoric with lack of motivation on exam -Have reached out to husband and set up call with husband and patient -Spoke with neurology who does not believe she  needs another MRI given prior was significantly motion degraded, rather he reports that it is not uncommon for recovery from encephalopathy to take time  Plan:  -continue lactulose and rifaximin -Continue PT/OT/ST -ordered follow-up speech eval in hopes of advancing diet    Epistaxis -Posterior nasal bleed secondary to NG trauma resolved with nasal packing placement on 11/7 per ENT, nasal packing removed 11/11 with continued bleeding following removal resulting in replacement packing to be left in place for 5 days,  with removal on 11/18 -Packing in place without evidence of overflow bleeding  Plan: -Continue to monitor -Packing removal today per ENT    Acute on chronic blood loss anemia/Heme positive stool/Gastritis and gastroduodenitis -CBC with stable hemoglobin around 8 -Nursing denies any further melena  Plan: -Continue Protonix 40 mg daily -Continue B12 1000 mcg daily    Urinary retention versus dehydration -Patient without spontaneous urination since 11/15 at 3 PM; bladder scan with 400 mL with around 400 mls removed with in and out cath yesterday -This could be due to her mental status such that she is unaware if she needs to urinate versus her mental status impedes her ability to eat and drink normally and thus she became dehydrated -Patient has minimal intake which may be contributing to lack of output  Plan: -Encourage increased p.o. intake and nursing assistance with p.o. intake -Every 4 hours bladder scans and as needed In-N-Out cath -Have requested speech reeval in hopes of advancing diet such that it may be more tolerable to the patient -Starting maintenance fluids  HFrEF: TEE in October with EF of 50 to 55%.  Moderate dilation of LA, RA.  Moderate MVR and stenosis.  Moderate TVR.  Does not appear fluid overloaded at this time.  Plan: -Daily weights and strict I's and O's  HTN: Continue Coreg  DM2: Continue SSI  Acute on CKD: Creatinine close to baseline.  Had bumped slightly yesterday in setting of likely dehydration.  We will continue to encourage p.o. intake and assistance with p.o. intake and follow-up creatinine with BMP.  Starting maintenance fluids.  Dispo: Anticipated discharge pending clinical course.  Al Decant, MD 12/04/2018, 11:18 AM Pager: [phone]

## 2018-12-05 DIAGNOSIS — K2991 Gastroduodenitis, unspecified, with bleeding: Secondary | ICD-10-CM

## 2018-12-05 DIAGNOSIS — K2971 Gastritis, unspecified, with bleeding: Principal | ICD-10-CM

## 2018-12-05 DIAGNOSIS — F329 Major depressive disorder, single episode, unspecified: Secondary | ICD-10-CM

## 2018-12-05 DIAGNOSIS — R630 Anorexia: Secondary | ICD-10-CM

## 2018-12-05 LAB — BASIC METABOLIC PANEL
Anion gap: 10 (ref 5–15)
BUN: 19 mg/dL (ref 8–23)
CO2: 19 mmol/L — ABNORMAL LOW (ref 22–32)
Calcium: 8.4 mg/dL — ABNORMAL LOW (ref 8.9–10.3)
Chloride: 111 mmol/L (ref 98–111)
Creatinine, Ser: 1.45 mg/dL — ABNORMAL HIGH (ref 0.44–1.00)
GFR calc Af Amer: 44 mL/min — ABNORMAL LOW (ref >60)
GFR calc non Af Amer: 38 mL/min — ABNORMAL LOW (ref >60)
Glucose, Bld: 98 mg/dL (ref 70–99)
Potassium: 4 mmol/L (ref 3.5–5.1)
Sodium: 140 mmol/L (ref 135–145)

## 2018-12-05 LAB — CBC
HCT: 28 % — ABNORMAL LOW (ref 36.0–46.0)
Hemoglobin: 8.1 g/dL — ABNORMAL LOW (ref 12.0–15.0)
MCH: 27.3 pg (ref 26.0–34.0)
MCHC: 28.9 g/dL — ABNORMAL LOW (ref 30.0–36.0)
MCV: 94.3 fL (ref 80.0–100.0)
Platelets: 102 10*3/uL — ABNORMAL LOW (ref 150–400)
RBC: 2.97 MIL/uL — ABNORMAL LOW (ref 3.87–5.11)
RDW: 18.7 % — ABNORMAL HIGH (ref 11.5–15.5)
WBC: 6.1 10*3/uL (ref 4.0–10.5)
nRBC: 0 % (ref 0.0–0.2)

## 2018-12-05 LAB — GLUCOSE, CAPILLARY
Glucose-Capillary: 99 mg/dL (ref 70–99)
Glucose-Capillary: 101 mg/dL — ABNORMAL HIGH (ref 70–99)
Glucose-Capillary: 101 mg/dL — ABNORMAL HIGH (ref 70–99)
Glucose-Capillary: 138 mg/dL — ABNORMAL HIGH (ref 70–99)

## 2018-12-05 MED ORDER — MIRTAZAPINE 30 MG PO TBDP
30.0000 mg | ORAL_TABLET | Freq: Every day | ORAL | Status: DC
  Administered 2018-12-05 – 2018-12-07 (×3): 30 mg via ORAL
  Filled 2018-12-05 (×3): qty 1

## 2018-12-05 MED ORDER — ENSURE ENLIVE PO LIQD
237.0000 mL | Freq: Two times a day (BID) | ORAL | Status: DC
  Administered 2018-12-06 – 2018-12-07 (×4): 237 mL via ORAL
  Filled 2018-12-05: qty 237

## 2018-12-05 MED ORDER — PRO-STAT SUGAR FREE PO LIQD
30.0000 mL | Freq: Two times a day (BID) | ORAL | Status: DC
  Administered 2018-12-05 – 2018-12-07 (×3): 30 mL via ORAL
  Filled 2018-12-05: qty 30

## 2018-12-05 NOTE — Plan of Care (Signed)

## 2018-12-05 NOTE — Progress Notes (Signed)
Patient refused to take evening dose of lactulose. Patient states, " I don't want to take it, I have taken it before." Patient educated on medication.

## 2018-12-05 NOTE — Progress Notes (Signed)
Initial Nutrition Assessment  RD working remotely.   DOCUMENTATION CODES:   Obesity unspecified  INTERVENTION:  - will order Ensure Enlive BID, each supplement provides 350 kcal and 20 grams of protein. - will order 30 mL Prostat BID, each supplement provides 100 kcal and 15 grams of protein. - continue to encourage PO intakes.    NUTRITION DIAGNOSIS:   Increased nutrient needs related to acute illness as evidenced by estimated needs.  GOAL:   Patient will meet greater than or equal to 90% of their needs  MONITOR:   PO intake, Supplement acceptance, Labs, Weight trends  REASON FOR ASSESSMENT:   LOS(day #15)  ASSESSMENT:   63 yo female with medical history of small bowel AVMs, COPD, CHF, type 2 DM, chronic hepatitis C, cervical cancer, chronic anemia, NSTEMI, upper GIB, HTN, HLD, and sleep apnea. She presented to the ED on 11/3 from nursing facility for GIB and AMS x1 day.  Weight slightly down from admission; 233 lb today and was 238 lb on 11/10. Flow sheet documentation indicates recent meal intakes: 11/15- 90% of breakfast, 0% of lunch 11/6- 60% of lunch, 30% of dinner 11/17- 70% of breakfast and lunch, 0% of dinner  Patient reports that appetite is fair at baseline and that this has persisted during hospitalization. She reports that appetite has been decreased in the past 2-3 days d/t depression/sadness with inability to pinpoint what is causing this feeling.   SLP is following and last assessed patient yesterday afternoon. Recommendation at that time for Dysphagia 2, thin liquids. Patient had reported to SLP that she prefers current diet texture to more solid textures due to ease of swallowing.    Per notes: - encephalopathy--improving, Neurology following - remeron started d/t concern for depression and decreased appetite - urinary retention vs dehydration--thought to be 2/2 decreased intakes; IV fluids not possible d/t patient pulling IV and refusing  replacement - epitaxis--s/p nasal packing on 11/7 and removal of packing on 11/11; resolved - acute on chronic anemia   Labs reviewed; creatinine: 1.45 mg/dl, Ca: 8.4 mg/dl, GFR: 44 ml/min. Medications reviewed; sliding scale novolog, 20 g lactulose TID, 30 mg remeron/night, 40 mg oral protonix/day, 1000 mcg oral cyanocobalamin/day.      NUTRITION - FOCUSED PHYSICAL EXAM:  unable to complete at this time.   Diet Order:   Diet Order            DIET DYS 2 Room service appropriate? Yes; Fluid consistency: Thin  Diet effective now              EDUCATION NEEDS:   No education needs have been identified at this time  Skin:  Skin Assessment: Reviewed RN Assessment  Last BM:  11/17  Height:   Ht Readings from Last 1 Encounters:  10/25/18 5' 8"  (1.727 m)    Weight:   Wt Readings from Last 1 Encounters:  12/05/18 105.9 kg    Ideal Body Weight:  63.6 kg  BMI:  Body mass index is 35.5 kg/m.  Estimated Nutritional Needs:   Kcal:  1900-2100 kcal  Protein:  90-105 grams  Fluid:  >/= 1.8 L/day      Jarome Matin, MS, RD, LDN, [facility] Inpatient Clinical Dietitian Pager # [phone] After hours/weekend pager # [phone]

## 2018-12-05 NOTE — TOC Progression Note (Addendum)
Transition of Care [facility]) - Progression Note    Patient Details  Name: SHAKEMA SURITA MRN: [id number] Date of Birth: [birth year]  [facility]) CM/SW [place], LCSW Phone Number: 12/05/2018, 3:01 PM  Clinical Narrative:    CSW continuing to follow for return to [facility]. CSW notes patient will require an updated COVID test prior to discharge. Per Freda Munro at [facility], they are unable to accept patient if she is ready on the weekend due to staffing.    Expected Discharge Plan: Skilled Nursing Facility Barriers to Discharge: Continued Medical Work up  Expected Discharge Plan and Services Expected Discharge Plan: [place]                                               Social Determinants of Health (SDOH) Interventions    Readmission Risk Interventions Readmission Risk Prevention Plan 10/26/2018 08/21/2018 09/30/2017  Transportation Screening Complete Complete Complete  PCP or Specialist Appt within 5-7 Days - - Complete  Home Care Screening - - Complete  Medication Review (RN CM) - - Complete  HRI or Home Care Consult Complete Complete -  Social Work Consult for [place] Planning/Counseling Not Complete - -  SW consult not completed comments not recovery care - -  Palliative Care Screening Not Applicable Not Applicable -  Medication Review (RN Care Manager) Referral to Pharmacy Complete -  [place] or Home Care Consult Complete - -  Palliative Care Screening Not Applicable - -  Some recent data might be hidden

## 2018-12-05 NOTE — Progress Notes (Signed)
Subjective: Patient is lying comfortably in bed.  She denies chest pain, shortness of breath, abdominal pain and bleeding.  She reports not eating and drinking normally as she doesn't have an appetite.  We stressed the importance of eating and drinking for her to be able to leave the hospital.  She reported she would try to eat and drink today.  She said she was thirsty and we provided her with water. She declined to take her medications last night.  She reports she does not like to be bothered.  She understands the importance of taking her meds.  She says she will take them today.  She enjoyed talking with her husband yesterday.  When I asked if she enjoyed speaking with him she said "always."  When asked if she is sad or anxious she reported that she was feeling sad.  We explained that this can happen in the hospital where patients can feel isolated and alone.  We asked if she would be amenable to starting a medication to help with depression and appetite stimulation.  She was interested in this medication which we will start.  Consults: N/A  Objective:  Vital signs in last 24 hours: Vitals:   12/05/18 0005 12/05/18 0006 12/05/18 0425 12/05/18 0811  BP: 134/75   137/70  Pulse: 75   72  Resp: 15  16 20   Temp:  98 F (36.7 C) 97.6 F (36.4 C) 97.9 F (36.6 C)  TempSrc:  Oral Oral Oral  SpO2: 98%   97%  Weight:   105.9 kg     Physical Exam  Constitutional: No distress.  Cardiovascular: Normal rate, regular rhythm, normal heart sounds and intact distal pulses.  No murmur heard. Pulmonary/Chest: Effort normal and breath sounds normal. No respiratory distress. She exhibits no tenderness.  Abdominal: Soft. Bowel sounds are normal. She exhibits no distension. There is no abdominal tenderness.  Musculoskeletal: Normal range of motion.  Neurological: She is alert.  Only oriented to self  Skin: Skin is warm and dry. She is not diaphoretic. No erythema.  Psychiatric: Affect normal.  Nursing  note and vitals reviewed.   I/Os:   Intake/Output Summary (Last 24 hours) at 12/05/2018 1130 Last data filed at 12/04/2018 1700 Gross per 24 hour  Intake 240 ml  Output -  Net 240 ml   Labs:  CBC Latest Ref Rng & Units 12/05/2018 12/04/2018 12/03/2018  WBC 4.0 - 10.5 K/uL 6.1 6.7 6.4  Hemoglobin 12.0 - 15.0 g/dL 8.1(L) 7.9(L) 8.3(L)  Hematocrit 36.0 - 46.0 % 28.0(L) 27.6(L) 28.4(L)  Platelets 150 - 400 K/uL 102(L) 98(L) 83(L)   BMP Latest Ref Rng & Units 12/05/2018 12/04/2018 12/03/2018  Glucose 70 - 99 mg/dL 98 131(H) 113(H)  BUN 8 - 23 mg/dL 19 19 17   Creatinine 0.44 - 1.00 mg/dL 1.45(H) 1.48(H) 1.59(H)  Sodium 135 - 145 mmol/L 140 141 145  Potassium 3.5 - 5.1 mmol/L 4.0 4.2 3.9  Chloride 98 - 111 mmol/L 111 114(H) 115(H)  CO2 22 - 32 mmol/L 19(L) 18(L) 19(L)  Calcium 8.9 - 10.3 mg/dL 8.4(L) 8.3(L) 8.4(L)   Assessment/Plan:  Assessment: Ms. Riecke is a 63 year old female with a past medical history of small bowel AVMs, COPD, HFrEF, DM2, HTN, HLD and sleep apnea who presented to the ED from a nursing facility for GI bleed and altered mental status now status post 5 units PRBCs and small bowel endoscopy without evidence of source of bleed without additional bleeding and with stable hemoglobin who appears  much improved from admission. AMS presumed to be secondary to acute hepatic encephalopathy in setting of elevated ammonia which improved with lactulose and rifaximin. Unfortunately mental status is not at baseline and patient continues to be oriented only to self with a dysphoric affect and anorexia.  Plan: Active Problems:   Encephalopathy acute/AMS   Probable cirrhosis -Encephalopathy improved but has reached a plateau -Patient much improved from admission but continues to be only oriented to self, she is not eating or drinking due to lack of appetite, and she appears dysphoric with lack of motivation on exam; today she reports she does feel sad -Mental status can be  secondary to depression which we will start to treat today -Neurology says the patient may take some time to return to baseline after an encephalopathy   Plan:  -continue lactulose and rifaximin -Continue PT/OT/ST -Start treatment for depression    Anorexia -In the last few days the patient has begun to eat and drink less secondary to decreased appetite -Patient appears dysphoric on exam; today she reports feeling sad; it is unclear if refusal to eat is secondary to depression versus an organic neurologic condition but we favor depression  Plan: -Given concern for depression based on exam, patient behaviors and patient report of feeling sad, will start mirtazapine which will assist with appetite stimulation as well    Depression -Patient appears dysphoric on exam, she has a lack of motivation to be involved in her physical exam and she reports feelings of sadness today -We are concerned that this is affecting her mental status and her ability to eat and drink  Plan: -Starting mirtazapine tonight to help with mood and appetite    Decreased urination/urinary retention versus dehydration -Patient has had decreased urination -Patient spontaneously urinated around 200 mL last evening -Decreased urination is likely secondary to decreased input  -Attempted to provide IV fluids, however patient pulled out her IV and refused another IV placement -Have encouraged increased p.o. and nurse assistance with eating and drinking  Plan: -Encourage increased p.o. intake and nursing assistance with p.o. intake -Every 4 hours bladder scans and as needed In-N-Out cath -Starting mirtazapine to help with appetite stimulation and increased p.o. intake      Epistaxis -Posterior nasal bleed secondary to NG trauma resolved with nasal packing placement on 11/7 per ENT, nasal packing removed 11/11 with continued bleeding following removal resulting in replacement packing to be left in place for 5 days, removed  yesterday -Bilateral nares without bleeding on exam  Plan: -Continue to monitor    Acute on chronic blood loss anemia/Heme positive stool/Gastritis and gastroduodenitis -CBC with stable hemoglobin around 8 -Nursing denies any further melena  Plan: -Continue Protonix 40 mg daily -Continue B12 1000 mcg daily  Acute on CKD: Creatinine close to baseline.  Had bumped slightly 2 days prior in setting of likely dehydration. Unable to start maintenance fluids.    Plan: -We will continue to encourage p.o. intake and assistance with p.o. intake and follow-up creatinine with BMP.   -Starting mirtazapine to help with appetite stimulation increase p.o. intake  HFrEF: TEE in October with EF of 50 to 55%.  Moderate dilation of LA, RA.  Moderate MVR and stenosis.  Moderate TVR.  Does not appear fluid overloaded at this time.  Plan: -Daily weights and strict I's and O's  HTN: Continue Coreg  DM2: Continue SSI  Dispo: Anticipated discharge pending clinical course.  Al Decant, MD 12/05/2018, 11:30 AM Pager: [phone]

## 2018-12-05 NOTE — Progress Notes (Signed)
Patient refusing night time medications.

## 2018-12-05 NOTE — Progress Notes (Signed)
Patient continuously takes telemetry leads and oxygen monitor off. Patient educated. Patient states, "just leave it off and leave it alone." On call Internal medicine resident aware. Will continue to monitor.

## 2018-12-06 LAB — BASIC METABOLIC PANEL
Anion gap: 8 (ref 5–15)
BUN: 15 mg/dL (ref 8–23)
CO2: 19 mmol/L — ABNORMAL LOW (ref 22–32)
Calcium: 8.2 mg/dL — ABNORMAL LOW (ref 8.9–10.3)
Chloride: 111 mmol/L (ref 98–111)
Creatinine, Ser: 1.48 mg/dL — ABNORMAL HIGH (ref 0.44–1.00)
GFR calc Af Amer: 43 mL/min — ABNORMAL LOW (ref >60)
GFR calc non Af Amer: 37 mL/min — ABNORMAL LOW (ref >60)
Glucose, Bld: 133 mg/dL — ABNORMAL HIGH (ref 70–99)
Potassium: 4.1 mmol/L (ref 3.5–5.1)
Sodium: 138 mmol/L (ref 135–145)

## 2018-12-06 LAB — CBC
HCT: 28.5 % — ABNORMAL LOW (ref 36.0–46.0)
Hemoglobin: 8.3 g/dL — ABNORMAL LOW (ref 12.0–15.0)
MCH: 27.2 pg (ref 26.0–34.0)
MCHC: 29.1 g/dL — ABNORMAL LOW (ref 30.0–36.0)
MCV: 93.4 fL (ref 80.0–100.0)
Platelets: 125 10*3/uL — ABNORMAL LOW (ref 150–400)
RBC: 3.05 MIL/uL — ABNORMAL LOW (ref 3.87–5.11)
RDW: 18.7 % — ABNORMAL HIGH (ref 11.5–15.5)
WBC: 5 10*3/uL (ref 4.0–10.5)
nRBC: 0 % (ref 0.0–0.2)

## 2018-12-06 LAB — GLUCOSE, CAPILLARY
Glucose-Capillary: 113 mg/dL — ABNORMAL HIGH (ref 70–99)
Glucose-Capillary: 152 mg/dL — ABNORMAL HIGH (ref 70–99)
Glucose-Capillary: 158 mg/dL — ABNORMAL HIGH (ref 70–99)
Glucose-Capillary: 161 mg/dL — ABNORMAL HIGH (ref 70–99)

## 2018-12-06 LAB — SARS CORONAVIRUS 2 (TAT 6-24 HRS): SARS Coronavirus 2: NEGATIVE

## 2018-12-06 MED ORDER — SPIRONOLACTONE 12.5 MG HALF TABLET
12.5000 mg | ORAL_TABLET | Freq: Every day | ORAL | Status: DC
  Administered 2018-12-06 – 2018-12-07 (×2): 12.5 mg via ORAL
  Filled 2018-12-06 (×2): qty 1

## 2018-12-06 NOTE — Progress Notes (Signed)
  Speech Language Pathology Treatment: Dysphagia  Patient Details Name: [name] MRN: [id number] DOB: [birth year] Today's Date: 12/06/2018 Time: 5621-3086 SLP Time Calculation (min) (ACUTE ONLY): 15 min  Assessment / Plan / Recommendation Clinical Impression  Pt seen for follow up to assess readiness to advance to dys 3 solids. Pt was seated in recliner. No family present. Pt ate very little of lunch today, but was receptive to graham crackers and peanut butter with OJ. Pt was noted to take small bites at a slow rate with solids, but drank liquids quickly via straw. No overt s/s aspiration observed. Pt reports getting tired easily, and continues to prefer dys 2 (finely chopped) solids at this time. Will continue current diet. Safe swallow precautions posted at [facility].     HPI HPI: Pt is a 63 yo female admitted from SNF with GI bleed and AMS. PMH: small bowel AVMs, COPD, HFrEF, DM 2, chronic hepatitis C, HTN, HLD, and sleep apnea      SLP Plan  Continue with current plan of care       Recommendations  Diet recommendations: Dysphagia 2 (fine chop);Thin liquid Liquids provided via: Cup;Straw Medication Administration: Whole meds with puree Supervision: Full supervision/cueing for compensatory strategies;Staff to assist with self feeding Compensations: Slow rate;Small sips/bites;Minimize environmental distractions Postural Changes and/or Swallow Maneuvers: Seated upright 90 degrees;Upright 30-60 min after meal                Oral Care Recommendations: Oral care BID Follow up Recommendations: Skilled Nursing facility;24 hour supervision/assistance SLP Visit Diagnosis: Dysphagia, unspecified (R13.10) Plan: Continue with current plan of care       [place], [facility], [place] Pathologist Office: [phone] Pager: [phone]  Shonna Chock 12/06/2018, 3:47 PM

## 2018-12-06 NOTE — Progress Notes (Signed)
Physical Therapy Treatment Patient Details Name: [name] MRN: [id number] DOB: 31-Mar-1955 Today's Date: 12/06/2018    History of Present Illness 63 yo female admitted to ED on 11/3 from [facility] with heme + stool, AMS suspect due to either toxic metabolic vs hepatic encephalopathy. 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 Enteroscopy procedure 11/5 reveals changes in focal area of fundus of unknown significance, source of bleeding not discovered. Pt with NGT placement, then removed, with periods of bloody vomit and nosebleeds due to NGT trauma (11/7). PMH includes median sternotomy, CABG, L great toe amp 6/7, cervical CA, CHF, hep C, chronic anemia, COPD, DM with polyneuropathy, NSTEMI, upper GI bleed, h/o carpal tunnel syndrome, HNP lumbar.    PT Comments    Pt performed gt training and functional mobility during session with max VCs for progression of functional mobility.  Pt is slow and guarded but responded better with her husband present.  She required moderate assistance for all aspects of mobility and continues to benefit from skilled nursing placement for continued rehab at d/c.      Follow Up Recommendations  SNF;Supervision/Assistance - 24 hour     Equipment Recommendations  None recommended by PT    Recommendations for Other Services       Precautions / Restrictions Precautions Precautions: Fall Restrictions Weight Bearing Restrictions: No    Mobility  Bed Mobility Overal bed mobility: Needs Assistance Bed Mobility: Supine to Sit     Supine to sit: Mod assist;HOB elevated     General bed mobility comments: Pt required assistance to move B LEs to edge of bed and to elevate trunk into sitting and boost hips to edge of bed.  Transfers Overall transfer level: Needs assistance Equipment used: Rolling walker (2 wheeled) Transfers: Sit to/from Stand Sit to Stand: Mod assist;+2  safety/equipment;From elevated surface         General transfer comment: Bed placed in elevated seat height and patient required assistance to push from bed to achieve standing.  She was unable to follow commands to push with B UEs and placed hands on RW.  Once in this position she did push more with B LEs.  Pt flexed forward with flexed hips.  Ambulation/Gait Ambulation/Gait assistance: Mod assist;+2 safety/equipment Gait Distance (Feet): 4 Feet Assistive device: Rolling walker (2 wheeled) Gait Pattern/deviations: Shuffle;Step-to pattern;Trunk flexed Gait velocity: decreased   General Gait Details: Shuffling steps from bed to recliner chair.  Cues to back to seated surface and keep RW close.   Stairs             Wheelchair Mobility    Modified Rankin (Stroke Patients Only)       Balance Overall balance assessment: Needs assistance Sitting-balance support: Feet supported;No upper extremity supported Sitting balance-Leahy Scale: Fair Sitting balance - Comments: minA for stability sitting EOB while engaging in ADL;intermittent no physcial assistance provided   Standing balance support: During functional activity Standing balance-Leahy Scale: Poor Standing balance comment: Heavy reliance on BUE support.                            Cognition Arousal/Alertness: Awake/alert Behavior During Therapy: Flat affect Overall Cognitive Status: No family/caregiver present to determine baseline cognitive functioning Area of Impairment: Following commands;Safety/judgement;Awareness;Problem solving                 Orientation Level: Disoriented to;Place;Time;Situation Current Attention Level: Focused Memory:  Decreased short-term memory Following Commands: Follows one step commands inconsistently Safety/Judgement: Decreased awareness of deficits;Decreased awareness of safety Awareness: Intellectual Problem Solving: Slow processing;Decreased initiation;Difficulty  sequencing;Requires verbal cues;Requires tactile cues General Comments: Pt more verbal with husband present but didn't verbalize much with therapist unless asking." Will you scratch my back."      Exercises      General Comments        Pertinent Vitals/Pain Pain Assessment: Faces Faces Pain Scale: Hurts little more Pain Location: Continues to flinch with Movement of R LE into knee flexion. Pain Descriptors / Indicators: Sore Pain Intervention(s): Monitored during session;Repositioned    Home Living                      Prior Function            PT Goals (current goals can now be found in the care plan section) Acute Rehab PT Goals Patient Stated Goal: none stated Potential to Achieve Goals: Fair Progress towards PT goals: Progressing toward goals    Frequency    Min 2X/week      PT Plan Current plan remains appropriate    Co-evaluation              AM-PAC PT "6 Clicks" Mobility   Outcome Measure  Help needed turning from your back to your side while in a flat bed without using bedrails?: A Lot Help needed moving from lying on your back to sitting on the side of a flat bed without using bedrails?: A Lot Help needed moving to and from a bed to a chair (including a wheelchair)?: A Lot Help needed standing up from a chair using your arms (e.g., wheelchair or bedside chair)?: A Lot Help needed to walk in hospital room?: A Lot Help needed climbing 3-5 steps with a railing? : Total 6 Click Score: 11    End of Session Equipment Utilized During Treatment: Gait belt Activity Tolerance: Patient limited by lethargy Patient left: with call bell/phone within reach;with nursing/sitter in room;in chair;with family/visitor present(husband by her side.) Nurse Communication: Mobility status PT Visit Diagnosis: Other abnormalities of gait and mobility (R26.89);Muscle weakness (generalized) (M62.81);Unsteadiness on feet (R26.81)     Time: 4497-5300 PT Time  Calculation (min) (ACUTE ONLY): 25 min  Charges:  $Gait Training: 8-22 mins $Therapeutic Activity: 8-22 mins                     Erasmo Leventhal , PTA Acute Rehabilitation Services Pager [phone] Office [phone]     Carmine Carrozza Eli Hose 12/06/2018, 2:53 PM

## 2018-12-06 NOTE — Progress Notes (Signed)
Subjective: Patient is lying comfortably in bed.  She denies shortness of breath. She reports urinating and having a bowel movement yesterday. She reports drinking Ensure yesterday. She reports not eating normally as she doesn't have an appetite. We stressed the importance of eating and drinking for her to be able to leave the hospital.  She denies being sad today. She has no other complaints or concerns.  Consults: N/A  Objective:  Vital signs in last 24 hours: Vitals:   12/05/18 1959 12/05/18 2352 12/06/18 0328 12/06/18 0545  BP: (!) 147/75 (!) 151/63  (!) 155/79  Pulse: 74 80  78  Resp: 20 16  16   Temp: 98 F (36.7 C) 98.3 F (36.8 C)  98.3 F (36.8 C)  TempSrc: Oral Oral  Oral  SpO2: 99% 97%  96%  Weight:   106.3 kg     Physical Exam  Constitutional: No distress.  Cardiovascular: Normal rate, regular rhythm, normal heart sounds and intact distal pulses.  No murmur heard. Pulmonary/Chest: Effort normal and breath sounds normal. No respiratory distress. She exhibits no tenderness.  Abdominal: Soft. Bowel sounds are normal. She exhibits no distension. There is no abdominal tenderness.  Musculoskeletal: Normal range of motion.  Neurological: She is alert.  Oriented to self, location and date  Skin: Skin is warm and dry. She is not diaphoretic. No erythema.  Psychiatric: Affect normal.  Nursing note and vitals reviewed.   I/Os:   Intake/Output Summary (Last 24 hours) at 12/06/2018 0805 Last data filed at 12/06/2018 0521 Gross per 24 hour  Intake 240 ml  Output 800 ml  Net -560 ml   Labs:  CBC Latest Ref Rng & Units 12/06/2018 12/05/2018 12/04/2018  WBC 4.0 - 10.5 K/uL 5.0 6.1 6.7  Hemoglobin 12.0 - 15.0 g/dL 8.3(L) 8.1(L) 7.9(L)  Hematocrit 36.0 - 46.0 % 28.5(L) 28.0(L) 27.6(L)  Platelets 150 - 400 K/uL 125(L) 102(L) 98(L)   BMP Latest Ref Rng & Units 12/06/2018 12/05/2018 12/04/2018  Glucose 70 - 99 mg/dL 133(H) 98 131(H)  BUN 8 - 23 mg/dL 15 19 19    Creatinine 0.44 - 1.00 mg/dL 1.48(H) 1.45(H) 1.48(H)  Sodium 135 - 145 mmol/L 138 140 141  Potassium 3.5 - 5.1 mmol/L 4.1 4.0 4.2  Chloride 98 - 111 mmol/L 111 111 114(H)  CO2 22 - 32 mmol/L 19(L) 19(L) 18(L)  Calcium 8.9 - 10.3 mg/dL 8.2(L) 8.4(L) 8.3(L)   Assessment/Plan:  Assessment: Ms. Ricke is a 63 year old female with a past medical history of small bowel AVMs, COPD, HFrEF, DM2, HTN, HLD and sleep apnea who presented to the ED from a nursing facility for GI bleed and altered mental status now status post 5 units PRBCs and small bowel endoscopy without evidence of source of bleed without additional bleeding and with stable hemoglobin who appears much improved from admission. AMS presumed to be secondary to acute hepatic encephalopathy in setting of elevated ammonia which improved with lactulose and rifaximin.  Plan: Active Problems:   Encephalopathy acute/AMS   Probable cirrhosis -Encephalopathy slowly improving -Patient more oriented today  Plan:  -continue lactulose and rifaximin -Continue PT/OT/ST -Continue Mirtazapine     Anorexia -In the last few days the patient has begun to eat and drink less secondary to decreased appetite for which we began Mirtazapine for concomitant dysphoric mood  -She reports drinking ensure yesterday  Plan: -Continue Mirtazapine    Depression -Patient has appeared dysphoric on exam recently and reported feeling sad for which we started Mirtazapine  Plan: -Continue  Mirtazapine    Acute on chronic blood loss anemia/Heme positive stool/Gastritis and gastroduodenitis -CBC with stable hemoglobin around 8  Plan: -Continue Protonix 40 mg daily  HFrEF: TEE in October with EF of 50 to 55%.  Moderate dilation of LA, RA.  Moderate MVR and stenosis.  Moderate TVR.  Does not appear fluid overloaded at this time.  Plan: -Daily weights and strict I's and O's  HTN: Continue Coreg  DM2: Continue SSI  Dispo: Anticipated discharge pending  clinical course.  Al Decant, MD 12/06/2018, 8:05 AM Pager: [phone]

## 2018-12-06 NOTE — Plan of Care (Signed)

## 2018-12-07 ENCOUNTER — Telehealth: Payer: Self-pay

## 2018-12-07 DIAGNOSIS — Z91040 Latex allergy status: Secondary | ICD-10-CM

## 2018-12-07 DIAGNOSIS — Z888 Allergy status to other drugs, medicaments and biological substances status: Secondary | ICD-10-CM

## 2018-12-07 DIAGNOSIS — Z6835 Body mass index (BMI) 35.0-35.9, adult: Secondary | ICD-10-CM

## 2018-12-07 LAB — CBC
HCT: 32.3 % — ABNORMAL LOW (ref 36.0–46.0)
Hemoglobin: 9.5 g/dL — ABNORMAL LOW (ref 12.0–15.0)
MCH: 27.2 pg (ref 26.0–34.0)
MCHC: 29.4 g/dL — ABNORMAL LOW (ref 30.0–36.0)
MCV: 92.6 fL (ref 80.0–100.0)
Platelets: 131 10*3/uL — ABNORMAL LOW (ref 150–400)
RBC: 3.49 MIL/uL — ABNORMAL LOW (ref 3.87–5.11)
RDW: 18.7 % — ABNORMAL HIGH (ref 11.5–15.5)
WBC: 5.6 10*3/uL (ref 4.0–10.5)
nRBC: 0 % (ref 0.0–0.2)

## 2018-12-07 LAB — BASIC METABOLIC PANEL
Anion gap: 10 (ref 5–15)
BUN: 16 mg/dL (ref 8–23)
CO2: 20 mmol/L — ABNORMAL LOW (ref 22–32)
Calcium: 8.5 mg/dL — ABNORMAL LOW (ref 8.9–10.3)
Chloride: 108 mmol/L (ref 98–111)
Creatinine, Ser: 1.39 mg/dL — ABNORMAL HIGH (ref 0.44–1.00)
GFR calc Af Amer: 46 mL/min — ABNORMAL LOW (ref >60)
GFR calc non Af Amer: 40 mL/min — ABNORMAL LOW (ref >60)
Glucose, Bld: 153 mg/dL — ABNORMAL HIGH (ref 70–99)
Potassium: 4.4 mmol/L (ref 3.5–5.1)
Sodium: 138 mmol/L (ref 135–145)

## 2018-12-07 LAB — GLUCOSE, CAPILLARY
Glucose-Capillary: 132 mg/dL — ABNORMAL HIGH (ref 70–99)
Glucose-Capillary: 144 mg/dL — ABNORMAL HIGH (ref 70–99)
Glucose-Capillary: 148 mg/dL — ABNORMAL HIGH (ref 70–99)
Glucose-Capillary: 159 mg/dL — ABNORMAL HIGH (ref 70–99)

## 2018-12-07 MED ORDER — PRO-STAT SUGAR FREE PO LIQD: 30.0000 mL | Freq: Two times a day (BID) | ORAL | 0 refills | Status: DC

## 2018-12-07 MED ORDER — MIRTAZAPINE 30 MG PO TBDP: 30.0000 mg | ORAL_TABLET | Freq: Every day | ORAL | 0 refills | Status: DC

## 2018-12-07 MED ORDER — ENSURE ENLIVE PO LIQD: 237.0000 mL | Freq: Two times a day (BID) | ORAL | 12 refills | Status: DC

## 2018-12-07 MED ORDER — LACTULOSE 10 GM/15ML PO SOLN: 20.0000 g | Freq: Three times a day (TID) | ORAL | 0 refills | Status: DC

## 2018-12-07 MED ORDER — PANTOPRAZOLE SODIUM 40 MG PO TBEC: 40.0000 mg | DELAYED_RELEASE_TABLET | Freq: Every day | ORAL | 0 refills | Status: DC

## 2018-12-07 NOTE — Telephone Encounter (Signed)
Yes regular is fine thank you!

## 2018-12-07 NOTE — Discharge Summary (Addendum)
Name: ODESSIA ASLESON MRN: [id number] DOB: 05-17-1955 63 y.o. PCP: [place]  Date of Admission: 11/20/2018 11:47 AM Date of Discharge:   12/07/2018 Attending Physician: Lucious Groves, DO  Discharge Diagnosis:  1. Acute blood loss anemia due to GI Bleed 2. Encephalopathy 3. Depression  Discharge Medications: Allergies as of 12/07/2018       Reactions   Latex Anaphylaxis, Rash   Gabapentin Nausea And Vomiting, Other (See Comments)        Medication List     STOP taking these medications    ferrous sulfate 325 (65 FE) MG tablet   furosemide 40 MG tablet Commonly known as: LASIX   Lantus SoloStar 100 UNIT/ML Solostar Pen Generic drug: Insulin Glargine       TAKE these medications    atorvastatin 40 MG tablet Commonly known as: LIPITOR Take 40 mg by mouth daily. Notes to patient: Tomorrow 12/08/2018 @ 10am   carvedilol 3.125 MG tablet Commonly known as: COREG Take 1 tablet (3.125 mg total) by mouth 2 (two) times daily with a meal. Notes to patient: Today 12/07/2018 @ 8pm   feeding supplement (ENSURE ENLIVE) Liqd Take 237 mLs by mouth 2 (two) times daily between meals. Notes to patient: Today 12/07/2018 @ 6pm   feeding supplement (PRO-STAT SUGAR FREE 64) Liqd Take 30 mLs by mouth 2 (two) times daily. Notes to patient: Today 12/07/2018 @ 4pm   fluticasone 50 MCG/ACT nasal spray Commonly known as: FLONASE Place 2 sprays into both nostrils daily. Notes to patient: Today 03/31/9456 @ 6pm   folic acid 1 MG tablet Commonly known as: FOLVITE Take 1 tablet (1 mg total) by mouth daily. Notes to patient: Tomorrow 12/08/2018 @ 10am   lactulose 10 GM/15ML solution Commonly known as: CHRONULAC Take 30 mLs (20 g total) by mouth 3 (three) times daily. Notes to patient: Today 12/07/2018 @ 6pm   mirtazapine 30 MG disintegrating tablet Commonly known as: REMERON SOL-TAB Take 1 tablet (30 mg total) by mouth at bedtime. Notes to patient: Today  12/07/2018 @ 10pm   multivitamin with minerals Tabs tablet Take 1 tablet by mouth daily. Notes to patient: Tomorrow 12/08/2018 @ 10am   pantoprazole 40 MG tablet Commonly known as: PROTONIX Take 1 tablet (40 mg total) by mouth daily. Notes to patient: Tomorrow 12/08/2018 @ 10am   spironolactone 25 MG tablet Commonly known as: ALDACTONE Take 12.5 mg by mouth daily. Notes to patient: Tomorrow 12/08/2018 @ 10am   torsemide 20 MG tablet Commonly known as: DEMADEX Take 20 mg by mouth daily. Take 2 tablets if weight is greater than 185 lbs. NO tablets if weight is less than 177 lbs. Notes to patient: Tomorrow 12/08/2018 @ 10am        Disposition and follow-up:   Ms.Paiton W Weiher was discharged from [facility] in stable condition.  At the hospital follow up visit please address:   1.  Patient admitted for GI bleed presumed to be from an AVM.  Please ensure patient is not bleeding. She also had acute encephalopathy which has improved greatly.  Please ensure that her mental status continues to improve and that she is taking her lactulose.  Please also ensure adherence to Remeron for new onset depression with decreased appetite.  2.  Labs / imaging needed at time of follow-up: CBC to check for stable hemoglobin  3.  Pending labs/ test needing follow-up: None  Follow-up Appointments:  Contact information for follow-up providers  Alice Reichert, Benay Pike, MD Follow up.   Specialty: Gastroenterology Why: If GI follow-up is needed please call Dr. Ricky Stabs office. Contact information: [address] [place] [address] [phone]         [facility] Follow up in 1 week(s).   Contact information: [place] [address] [phone]              Contact information for after-discharge care     [facility] .   Service: Skilled Nursing Contact information: [place] Hospital Course by problem list:  1. Acute blood loss anemia due to GI Bleed -Patient presents to the ED with GI bleeding in setting of recent capsule endoscopy significant for AVMs.  A small bowel endoscopy was attempted here but did not reveal etiology of bleed despite continued GI bleeding.  Patient received 5 transfusions of packed red blood cells.  GI bleeding stopped on hospital day 10 with improvement of hemoglobin.  2. Encephalopathy- most likely Hepatic encephalopathy  Patient presented with acute encephalopathy and elevated ammonia levels which improved with lactulose and rifaximin.  We obtained imaging of her head which was unremarkable.  Neurology states that patients can take time to improve after encephalopathy.  Her mental status and orientation have improved greatly even though they are not back to baseline.  She is almost completely oriented, much improved from prior.  She should continue to take lactulose with goal of 2-3 BMs per day with the addition of rifaximin if needed.  3. Depression -During her hospital stay the patient exhibited dysphoric mood and endorsed sadness.  She was also having decreased appetite.  We started mirtazapine with improvement in mood and appetite.  May benefit from additional depression medications outpatient.  Discharge Vitals:   BP (!) 152/81 (BP Location: Right Arm)   Pulse 80   Temp 98.9 F (37.2 C) (Oral)   Resp 17   Wt 104.4 kg   SpO2 98%   BMI 35.00 kg/m   Pertinent Labs, Studies, and Procedures:   Results for orders placed or performed during the hospital encounter of 11/20/18 (from the past 24 hour(s))  Glucose, capillary     Status: Abnormal   Collection Time: 12/06/18  5:17 PM  Result Value Ref Range   Glucose-Capillary 158 (H) 70 - 99 mg/dL  Glucose, capillary     Status: Abnormal   Collection Time: 12/06/18  9:55 PM  Result Value Ref Range   Glucose-Capillary  161 (H) 70 - 99 mg/dL  CBC     Status: Abnormal   Collection Time: 12/07/18  2:17 AM  Result Value Ref Range   WBC 5.6 4.0 - 10.5 K/uL   RBC 3.49 (L) 3.87 - 5.11 MIL/uL   Hemoglobin 9.5 (L) 12.0 - 15.0 g/dL   HCT 32.3 (L) 36.0 - 46.0 %   MCV 92.6 80.0 - 100.0 fL   MCH 27.2 26.0 - 34.0 pg   MCHC 29.4 (L) 30.0 - 36.0 g/dL   RDW 18.7 (H) 11.5 - 15.5 %   Platelets 131 (L) 150 - 400 K/uL   nRBC 0.0 0.0 - 0.2 %  Basic metabolic panel     Status: Abnormal   Collection Time: 12/07/18  2:17 AM  Result Value Ref Range   Sodium 138 135 -  145 mmol/L   Potassium 4.4 3.5 - 5.1 mmol/L   Chloride 108 98 - 111 mmol/L   CO2 20 (L) 22 - 32 mmol/L   Glucose, Bld 153 (H) 70 - 99 mg/dL   BUN 16 8 - 23 mg/dL   Creatinine, Ser 1.39 (H) 0.44 - 1.00 mg/dL   Calcium 8.5 (L) 8.9 - 10.3 mg/dL   GFR calc non Af Amer 40 (L) >60 mL/min   GFR calc Af Amer 46 (L) >60 mL/min   Anion gap 10 5 - 15  Glucose, capillary     Status: Abnormal   Collection Time: 12/07/18  8:59 AM  Result Value Ref Range   Glucose-Capillary 132 (H) 70 - 99 mg/dL  Glucose, capillary     Status: Abnormal   Collection Time: 12/07/18 12:09 PM  Result Value Ref Range   Glucose-Capillary 144 (H) 70 - 99 mg/dL   Small bowel endoscopy:  - Normal esophagus. - Subtle changes in the focal area of the fundus of unclear clinical significance. May be due to scope trauma that occurred during retroflexion. Biopsied. - Normal examined duodenum. AVM identified on recent capsule endoscopy was not seen today. - The examined portion of the jejunum was normal. - No evidence for portal hypertensive gastropathy or gastric varices. - Source of recurrent GI blood loss anemia not identified on this study.  Discharge Instructions: Discharge Instructions     Diet - low sodium heart healthy   Complete by: As directed    Increase activity slowly   Complete by: As directed        Signed: Al Decant, MD 12/07/2018, 1:43 PM   Pager: [phone]

## 2018-12-07 NOTE — Telephone Encounter (Signed)
TC to [facility], spoke with pharmacist Joseph Art and informed her regular tablets for Remeron were fine per Dr. Madilyn Fireman. SChaplin, RN,BSN

## 2018-12-07 NOTE — Progress Notes (Signed)
Occupational Therapy Treatment Patient Details Name: [name] MRN: [id number] DOB: Sep 27, 1955 Today's Date: 12/07/2018    History of present illness 63 yo female admitted to ED on 11/3 from [facility] with heme + stool, AMS suspect due to either toxic metabolic vs hepatic encephalopathy. 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 Enteroscopy procedure 11/5 reveals changes in focal area of fundus of unknown significance, source of bleeding not discovered. Pt with NGT placement, then removed, with periods of bloody vomit and nosebleeds due to NGT trauma (11/7). PMH includes median sternotomy, CABG, L great toe amp 6/7, cervical CA, CHF, hep C, chronic anemia, COPD, DM with polyneuropathy, NSTEMI, upper GI bleed, h/o carpal tunnel syndrome, HNP lumbar.   OT comments  Patient semi-supine in bed upon arrival, drowsy requiring multimodal cues to arouse. Patient requires increase time for carry over/task initiation throughout session. Min/mod A for bed mobility, tactile cues to mobilize LEs towards edge of bed and min/mod A for trunk support with HOB raised. Initially wanting to attempt functional ambulation in the room, patient agreeable. Min A x2 for sit to stand with verbal/tactile cues for hand placement, once standing patient takes steps towards chair. Attempt to redirect however patient is letting go of walker/decreased safety awareness and becomes agitated therefore transferred to bedside chair for patient safety.    Follow Up Recommendations  SNF    Equipment Recommendations  None recommended by OT       Precautions / Restrictions Precautions Precautions: Fall Restrictions Weight Bearing Restrictions: No       Mobility Bed Mobility Overal bed mobility: Needs Assistance Bed Mobility: Supine to Sit     Supine to sit: Min assist;Mod assist;HOB elevated     General bed mobility comments: increased time and mod to  max verbal, tactile cues for task initiation  Transfers Overall transfer level: Needs assistance Equipment used: Rolling walker (2 wheeled) Transfers: Sit to/from Stand Sit to Stand: Min assist;+2 physical assistance;+2 safety/equipment;From elevated surface         General transfer comment: verbal/tactile cues for hand placement with sit to stand    Balance Overall balance assessment: Needs assistance Sitting-balance support: Feet supported;No upper extremity supported Sitting balance-Leahy Scale: Fair     Standing balance support: Bilateral upper extremity supported;During functional activity Standing balance-Leahy Scale: Poor                             ADL either performed or assessed with clinical judgement   ADL Overall ADL's : Needs assistance/impaired     Grooming: Set up;Sitting;Brushing hair                   Toilet Transfer: Minimal assistance;+2 for safety/equipment;+2 for physical assistance;Ambulation;RW Toilet Transfer Details (indicate cue type and reason): simulated with chair transfer, decreased safety letting go of walker          Functional mobility during ADLs: Minimal assistance;+2 for physical assistance;+2 for safety/equipment;Rolling walker                 Cognition Arousal/Alertness: Awake/alert(initially lethargic, became more alert with mobility) Behavior During Therapy: Impulsive;Flat affect;Agitated Overall Cognitive Status: No family/caregiver present to determine baseline cognitive functioning Area of Impairment: Following commands;Safety/judgement                       Following Commands: Follows one step commands inconsistently;Follows one step  commands with increased time Safety/Judgement: Decreased awareness of safety     General Comments: patient let go of walker, verbal cues for safety to maintain hold of walker, patient became agitated and cursed                   Pertinent Vitals/  Pain       Pain Assessment: No/denies pain         Frequency  Min 2X/week        Progress Toward Goals  OT Goals(current goals can now be found in the care plan section)  Progress towards OT goals: Progressing toward goals  Acute Rehab OT Goals Patient Stated Goal: none stated OT Goal Formulation: Patient unable to participate in goal setting Time For Goal Achievement: 12/21/18 Potential to Achieve Goals: Good ADL Goals Pt Will Perform Grooming: sitting;with set-up;with supervision Pt Will Perform Lower Body Bathing: sitting/lateral leans;with min assist;with adaptive equipment Pt Will Perform Lower Body Dressing: sit to/from stand;with adaptive equipment;with min assist  Plan Discharge plan remains appropriate;Frequency remains appropriate    Co-evaluation    PT/OT/SLP Co-Evaluation/Treatment: Yes Reason for Co-Treatment: To address functional/ADL transfers;For patient/therapist safety   OT goals addressed during session: ADL's and self-care      AM-PAC OT "6 Clicks" Daily Activity     Outcome Measure   Help from another person eating meals?: None Help from another person taking care of personal grooming?: A Little Help from another person toileting, which includes using toliet, bedpan, or urinal?: A Lot Help from another person bathing (including washing, rinsing, drying)?: A Lot Help from another person to put on and taking off regular upper body clothing?: A Little Help from another person to put on and taking off regular lower body clothing?: A Lot 6 Click Score: 16    End of Session Equipment Utilized During Treatment: Rolling walker  OT Visit Diagnosis: Unsteadiness on feet (R26.81);Muscle weakness (generalized) (M62.81)   Activity Tolerance Treatment limited secondary to agitation   Patient Left in chair;with call bell/phone within reach;with chair alarm set   Nurse Communication Mobility status        Time: 5974-1638 OT Time Calculation (min):  23 min  Charges: OT General Charges $OT Visit: 1 Visit OT Treatments $Self Care/Home Management : 8-22 mins  [place] OT office: [place] 12/07/2018, 12:55 PM

## 2018-12-07 NOTE — Progress Notes (Signed)
Patient Taken by Corey Harold for [facility] placement. Patient has no iv assess and belongings with her. Report given to the Nurse Maisie Fus at [facility]. Patient was alert oriented  and vital signs stable.

## 2018-12-07 NOTE — Progress Notes (Signed)
Physical Therapy Treatment Patient Details Name: [name] MRN: [id number] DOB: June 22, 1955 Today's Date: 12/07/2018    History of Present Illness 63 yo female admitted to ED on 11/3 from [facility] with heme + stool, AMS suspect due to either toxic metabolic vs hepatic encephalopathy. 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 Enteroscopy procedure 11/5 reveals changes in focal area of fundus of unknown significance, source of bleeding not discovered. Pt with NGT placement, then removed, with periods of bloody vomit and nosebleeds due to NGT trauma (11/7). PMH includes median sternotomy, CABG, L great toe amp 6/7, cervical CA, CHF, hep C, chronic anemia, COPD, DM with polyneuropathy, NSTEMI, upper GI bleed, h/o carpal tunnel syndrome, HNP lumbar.    PT Comments    Pt more alert than when I have seen her before. Pt delayed in her responses and following directions.  Pt unable to understand the PT plan to walk and got irritated letting go of RW and trying to grab window frame, chair etc.  Pt became aggitated and cursing.  Pt high fall risk.  I agree with SNF placement as safest DC plan for pt.   Follow Up Recommendations  SNF;Supervision/Assistance - 24 hour     Equipment Recommendations  None recommended by PT    Recommendations for Other Services       Precautions / Restrictions Precautions Precautions: Fall Precaution Comments: pt with decreased cooperation.  slow to respond Restrictions Weight Bearing Restrictions: No    Mobility  Bed Mobility Overal bed mobility: Needs Assistance Bed Mobility: Supine to Sit     Supine to sit: Min assist;Mod assist;HOB elevated     General bed mobility comments: increased time and mod to max verbal, tactile cues for task initiation.  pt able to scoot out but needed extra direction and cues  Transfers Overall transfer level: Needs assistance Equipment used: Rolling  walker (2 wheeled) Transfers: Sit to/from Stand Sit to Stand: Min assist;+2 physical assistance;+2 safety/equipment;From elevated surface         General transfer comment: verbal/tactile cues for hand placement with sit to stand  Ambulation/Gait Ambulation/Gait assistance: Mod assist;+2 safety/equipment Gait Distance (Feet): 3 Feet Assistive device: Rolling walker (2 wheeled) Gait Pattern/deviations: Shuffle;Step-to pattern;Trunk flexed     General Gait Details: tried to have pt use RW and walk toward closet and we were to follow with the chair.  after several explanations - pt had no idea what our plan was and let to of RW and grabbed window seal, chair etc to get to the chair to sit. Pt never got into erect posture.  pt unsafe with her mobilty.   Stairs             Wheelchair Mobility    Modified Rankin (Stroke Patients Only)       Balance Overall balance assessment: Needs assistance Sitting-balance support: Feet supported;No upper extremity supported Sitting balance-Leahy Scale: Fair     Standing balance support: Bilateral upper extremity supported;During functional activity Standing balance-Leahy Scale: Poor Standing balance comment: grabbing onto walker/ furniture etc for support                            Cognition Arousal/Alertness: Lethargic;Awake/alert Behavior During Therapy: Impulsive;Flat affect;Agitated Overall Cognitive Status: No family/caregiver present to determine baseline cognitive functioning Area of Impairment: Following commands;Safety/judgement  Following Commands: Follows one step commands inconsistently;Follows one step commands with increased time Safety/Judgement: Decreased awareness of safety     General Comments: pt asleep but woke up easily.  pt stayed awake throughout session.  pt started out cooperative but slow to respond and inconsistent following one step commands.  we reveiwed the plan  -twice and then got up and pt did n ot know what our plan was - tried to go a different direction.  let go of RW and became agitated and tried to push away the Rw while cursing.      Exercises      General Comments        Pertinent Vitals/Pain Pain Assessment: No/denies pain Pain Intervention(s): Monitored during session    Home Living                      Prior Function            PT Goals (current goals can now be found in the care plan section) Acute Rehab PT Goals Patient Stated Goal: none stated Progress towards PT goals: Progressing toward goals    Frequency    Min 2X/week      PT Plan Current plan remains appropriate    Co-evaluation PT/OT/SLP Co-Evaluation/Treatment: Yes Reason for Co-Treatment: To address functional/ADL transfers;For patient/therapist safety PT goals addressed during session: Mobility/safety with mobility;Balance OT goals addressed during session: ADL's and self-care      AM-PAC PT "6 Clicks" Mobility   Outcome Measure  Help needed turning from your back to your side while in a flat bed without using bedrails?: A Lot Help needed moving from lying on your back to sitting on the side of a flat bed without using bedrails?: A Lot Help needed moving to and from a bed to a chair (including a wheelchair)?: A Lot Help needed standing up from a chair using your arms (e.g., wheelchair or bedside chair)?: A Lot Help needed to walk in hospital room?: A Lot Help needed climbing 3-5 steps with a railing? : Total 6 Click Score: 11    End of Session Equipment Utilized During Treatment: Gait belt Activity Tolerance: Treatment limited secondary to agitation Patient left: in chair;with chair alarm set;with call bell/phone within reach Nurse Communication: Mobility status;Precautions PT Visit Diagnosis: Other abnormalities of gait and mobility (R26.89);Muscle weakness (generalized) (M62.81);Unsteadiness on feet (R26.81)     Time:  2956-2130 PT Time Calculation (min) (ACUTE ONLY): 15 min  Charges:  $Gait Training: 8-22 mins                     12/07/2018   Rande Lawman, PT    [name] 12/07/2018, 1:20 PM

## 2018-12-07 NOTE — Telephone Encounter (Signed)
Received TC from pharmacist, Joseph Art, at [facility]  RX received today for Remeron ODT, they do not have ODT and it will be sometime next week before they can get it.  Wants to know if Remeron regular tabs ok?  (Pt listed as current in-patient). Thank you, SChaplin, RN,BSN

## 2018-12-07 NOTE — Plan of Care (Signed)

## 2018-12-07 NOTE — Discharge Instructions (Signed)
You were admitted to the hospital for a GI bleed and confusion.  You underwent small bowel endoscopy without evidence of source of bleed.  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  We think your bleed was from 1 of these.  You received 5 units of blood.  Since then you have had no additional bleeding with stable hemoglobin.  Your mental status was presumed secondary to an elevated ammonia level which has decreased since admission.  Your mental status has improved greatly with lactulose and rifaximin.  You should continue taking lactulose after discharge with goal of 2-3 BMs per day. You can restart rifaxamin as needed. After being in the hospital so long you had some depression and decreased appetite for which we have started a medicine called mirtazapine which helps with mood and appetite.  You report an increased appetite and are drinking ensures for caloric supplementation.   You should follow-up with your primary care doctor within a week after discharge and with your GI doctor within a week after discharge.

## 2018-12-07 NOTE — Progress Notes (Signed)
Attempted to call report to nurse at [facility]. No answer

## 2018-12-07 NOTE — TOC Transition Note (Signed)
Transition of Care [facility]) - CM/SW Discharge Note   Patient Details  Name: [name] MRN: [id number] Date of Birth: October 02, 1955  Transition of Care [facility]) CM/SW Contact:  Benard Halsted, LCSW Phone Number: 12/07/2018, 3:14 PM   Clinical Narrative:    Patient will DC to: [place] Anticipated DC date: 12/07/18 Family notified: Spouse Transport by: Corey Harold (Incompetency Letter in [place] packet)   Per MD patient ready for DC to [place], patient, patient's family, and facility notified of DC. Discharge Summary and FL2 sent to facility. RN to call report prior to discharge [phone]). DC packet on chart. Ambulance transport requested for patient.   CSW will sign off for now as social work intervention is no longer needed. Please consult [place] again if new needs arise.  Cedric Fishman, LCSW Clinical Social Worker [phone]    Final next level of care: Skilled Nursing Facility Barriers to Discharge: No Barriers Identified   Patient Goals and CMS Choice Patient states their goals for this hospitalization and ongoing recovery are:: Unable to talk with patient as she is not totally oriented. Talked with husband and he understands that patient needs to get stronger before returning home CMS Medicare.gov Compare Post Acute Care list provided to:: Other (Comment Required)(Husband agreeable to patient returning to [facility] at discharge) Choice offered to / list presented to : NA(Patient from [facility] and will return there at discharge)  Discharge Placement   Existing PASRR number confirmed : 12/07/18          Patient chooses bed at: [facility] Patient to be transferred to facility by: [place] Name of family member notified: Spouse Patient and family notified of of transfer: 12/07/18  Discharge Plan and Services                DME Arranged: N/A         HH Arranged: NA [place] Agency: NA        Social Determinants of Health ([place]) Interventions     Readmission  Risk Interventions Readmission Risk Prevention Plan 10/26/2018 08/21/2018 09/30/2017  Transportation Screening Complete Complete Complete  PCP or Specialist Appt within 5-7 Days - - Complete  Home Care Screening - - Complete  Medication Review (RN CM) - - Complete  HRI or Home Care Consult Complete Complete -  Social Work Consult for [place] Planning/Counseling Not Complete - -  SW consult not completed comments not recovery care - -  Palliative Care Screening Not Applicable Not Applicable -  Medication Review (RN Care Manager) Referral to Pharmacy Complete -  [place] or Home Care Consult Complete - -  Palliative Care Screening Not Applicable - -  Some recent data might be hidden

## 2018-12-07 NOTE — Progress Notes (Signed)
Subjective: Patient is lying comfortably in bed.  She denies shortness of breath and chest pain.  She reports an improvement in her appetite.  She reports drinking ensures yesterday. She has no other complaints or concerns.  Consults: N/A   Objective:  Vital signs in last 24 hours: Vitals:   12/06/18 0827 12/06/18 1538 12/06/18 2153 12/07/18 0518  BP: (!) 153/80 (!) 160/81 (!) 145/65 (!) 152/81  Pulse: 80 77 73 80  Resp: 16 18 18 17   Temp: 97.6 F (36.4 C) (!) 97.5 F (36.4 C) 97.7 F (36.5 C) 98.9 F (37.2 C)  TempSrc: Oral Oral Oral Oral  SpO2: 98% 100% 100% 98%  Weight:    104.4 kg    Physical Exam  Constitutional: No distress.  Cardiovascular: Normal rate, regular rhythm, normal heart sounds and intact distal pulses.  No murmur heard. Pulmonary/Chest: Effort normal and breath sounds normal. No respiratory distress. She exhibits no tenderness.  Abdominal: Soft. Bowel sounds are normal. She exhibits no distension. There is no abdominal tenderness.  Musculoskeletal: Normal range of motion.  Neurological: She is alert.  Oriented to self, location and date  Skin: Skin is warm and dry. She is not diaphoretic. No erythema.  Psychiatric: Affect normal.  Nursing note and vitals reviewed.   I/Os:  No intake or output data in the 24 hours ending 12/07/18 0827 Labs:  CBC Latest Ref Rng & Units 12/07/2018 12/06/2018 12/05/2018  WBC 4.0 - 10.5 K/uL 5.6 5.0 6.1  Hemoglobin 12.0 - 15.0 g/dL 9.5(L) 8.3(L) 8.1(L)  Hematocrit 36.0 - 46.0 % 32.3(L) 28.5(L) 28.0(L)  Platelets 150 - 400 K/uL 131(L) 125(L) 102(L)   BMP Latest Ref Rng & Units 12/07/2018 12/06/2018 12/05/2018  Glucose 70 - 99 mg/dL 153(H) 133(H) 98  BUN 8 - 23 mg/dL 16 15 19   Creatinine 0.44 - 1.00 mg/dL 1.39(H) 1.48(H) 1.45(H)  Sodium 135 - 145 mmol/L 138 138 140  Potassium 3.5 - 5.1 mmol/L 4.4 4.1 4.0  Chloride 98 - 111 mmol/L 108 111 111  CO2 22 - 32 mmol/L 20(L) 19(L) 19(L)  Calcium 8.9 - 10.3 mg/dL 8.5(L)  8.2(L) 8.4(L)   Assessment/Plan:  Assessment: Ms. Mouser is a 63 year old female with a past medical history of small bowel AVMs, COPD, HFrEF, DM2, HTN, HLD and sleep apnea who presented to the ED from a nursing facility for GI bleed and altered mental status now status post 5 units PRBCs and small bowel endoscopy without evidence of source of bleed without additional bleeding and with stable hemoglobin who appears much improved from admission. AMS presumed to be secondary to acute hepatic encephalopathy in setting of elevated ammonia which improved with lactulose and rifaximin.  Plan: Active Problems:   Encephalopathy acute/AMS   Probable cirrhosis -Encephalopathy slowly improving -Patient alert with improved orientation  Plan:  -continue lactulose    Anorexia -After decrease in appetite secondary to presumed depression we started mirtazapine with which she has had a slight increase in appetite.  She has been drinking ensures.  Plan: -Continue Mirtazapine    Depression -Patient has appeared dysphoric on exam recently and reported feeling sad for which we started Mirtazapine  Plan: -Continue Mirtazapine    Acute on chronic blood loss anemia/Heme positive stool/Gastritis and gastroduodenitis -CBC with stable hemoglobin today at 9.5  Plan: -Continue Protonix 40 mg daily  HFrEF: TEE in October with EF of 50 to 55%.  Moderate dilation of LA, RA.  Moderate MVR and stenosis.  Moderate TVR.  Does not appear fluid  overloaded at this time.  Plan: -Daily weights and strict I's and O's  HTN: Continue Coreg  DM2: Continue SSI  Dispo: Anticipated discharge today  Al Decant, MD 12/07/2018, 8:27 AM Pager: [phone]

## 2018-12-16 ENCOUNTER — Other Ambulatory Visit: Payer: Self-pay

## 2018-12-16 ENCOUNTER — Inpatient Hospital Stay (HOSPITAL_COMMUNITY)
Admission: EM | Admit: 2018-12-16 | Discharge: 2019-01-02 | DRG: 378 | Disposition: A | Discharge status: Skilled Nursing Facility | Payer: Medicaid Other | Source: Skilled Nursing Facility | Attending: Internal Medicine | Admitting: Internal Medicine

## 2018-12-16 ENCOUNTER — Encounter (HOSPITAL_COMMUNITY): Payer: Self-pay | Admitting: Internal Medicine

## 2018-12-16 DIAGNOSIS — D689 Coagulation defect, unspecified: Secondary | ICD-10-CM | POA: Diagnosis present

## 2018-12-16 DIAGNOSIS — I2510 Atherosclerotic heart disease of native coronary artery without angina pectoris: Secondary | ICD-10-CM | POA: Diagnosis present

## 2018-12-16 DIAGNOSIS — D6959 Other secondary thrombocytopenia: Secondary | ICD-10-CM | POA: Diagnosis present

## 2018-12-16 DIAGNOSIS — F419 Anxiety disorder, unspecified: Secondary | ICD-10-CM | POA: Diagnosis present

## 2018-12-16 DIAGNOSIS — D500 Iron deficiency anemia secondary to blood loss (chronic): Secondary | ICD-10-CM | POA: Diagnosis present

## 2018-12-16 DIAGNOSIS — I5022 Chronic systolic (congestive) heart failure: Secondary | ICD-10-CM | POA: Diagnosis present

## 2018-12-16 DIAGNOSIS — Z8719 Personal history of other diseases of the digestive system: Secondary | ICD-10-CM

## 2018-12-16 DIAGNOSIS — F32A Depression, unspecified: Secondary | ICD-10-CM | POA: Diagnosis present

## 2018-12-16 DIAGNOSIS — Z79899 Other long term (current) drug therapy: Secondary | ICD-10-CM

## 2018-12-16 DIAGNOSIS — E1122 Type 2 diabetes mellitus with diabetic chronic kidney disease: Secondary | ICD-10-CM | POA: Diagnosis present

## 2018-12-16 DIAGNOSIS — K7460 Unspecified cirrhosis of liver: Secondary | ICD-10-CM | POA: Diagnosis present

## 2018-12-16 DIAGNOSIS — Z833 Family history of diabetes mellitus: Secondary | ICD-10-CM

## 2018-12-16 DIAGNOSIS — Z888 Allergy status to other drugs, medicaments and biological substances status: Secondary | ICD-10-CM

## 2018-12-16 DIAGNOSIS — G4730 Sleep apnea, unspecified: Secondary | ICD-10-CM | POA: Diagnosis present

## 2018-12-16 DIAGNOSIS — M549 Dorsalgia, unspecified: Secondary | ICD-10-CM | POA: Diagnosis present

## 2018-12-16 DIAGNOSIS — K5521 Angiodysplasia of colon with hemorrhage: Secondary | ICD-10-CM | POA: Diagnosis present

## 2018-12-16 DIAGNOSIS — Z8711 Personal history of peptic ulcer disease: Secondary | ICD-10-CM

## 2018-12-16 DIAGNOSIS — B182 Chronic viral hepatitis C: Secondary | ICD-10-CM | POA: Diagnosis present

## 2018-12-16 DIAGNOSIS — Z20828 Contact with and (suspected) exposure to other viral communicable diseases: Secondary | ICD-10-CM | POA: Diagnosis present

## 2018-12-16 DIAGNOSIS — I425 Other restrictive cardiomyopathy: Secondary | ICD-10-CM | POA: Diagnosis present

## 2018-12-16 DIAGNOSIS — D649 Anemia, unspecified: Secondary | ICD-10-CM | POA: Diagnosis not present

## 2018-12-16 DIAGNOSIS — I130 Hypertensive heart and chronic kidney disease with heart failure and stage 1 through stage 4 chronic kidney disease, or unspecified chronic kidney disease: Secondary | ICD-10-CM | POA: Diagnosis present

## 2018-12-16 DIAGNOSIS — K635 Polyp of colon: Secondary | ICD-10-CM | POA: Diagnosis present

## 2018-12-16 DIAGNOSIS — F17210 Nicotine dependence, cigarettes, uncomplicated: Secondary | ICD-10-CM | POA: Diagnosis present

## 2018-12-16 DIAGNOSIS — L89152 Pressure ulcer of sacral region, stage 2: Secondary | ICD-10-CM | POA: Diagnosis present

## 2018-12-16 DIAGNOSIS — N179 Acute kidney failure, unspecified: Secondary | ICD-10-CM | POA: Diagnosis present

## 2018-12-16 DIAGNOSIS — R933 Abnormal findings on diagnostic imaging of other parts of digestive tract: Secondary | ICD-10-CM | POA: Diagnosis not present

## 2018-12-16 DIAGNOSIS — Z8541 Personal history of malignant neoplasm of cervix uteri: Secondary | ICD-10-CM

## 2018-12-16 DIAGNOSIS — Z91040 Latex allergy status: Secondary | ICD-10-CM

## 2018-12-16 DIAGNOSIS — Z6835 Body mass index (BMI) 35.0-35.9, adult: Secondary | ICD-10-CM

## 2018-12-16 DIAGNOSIS — K284 Chronic or unspecified gastrojejunal ulcer with hemorrhage: Secondary | ICD-10-CM | POA: Diagnosis present

## 2018-12-16 DIAGNOSIS — I10 Essential (primary) hypertension: Secondary | ICD-10-CM | POA: Diagnosis not present

## 2018-12-16 DIAGNOSIS — E669 Obesity, unspecified: Secondary | ICD-10-CM | POA: Diagnosis present

## 2018-12-16 DIAGNOSIS — E1165 Type 2 diabetes mellitus with hyperglycemia: Secondary | ICD-10-CM | POA: Diagnosis not present

## 2018-12-16 DIAGNOSIS — K31819 Angiodysplasia of stomach and duodenum without bleeding: Secondary | ICD-10-CM | POA: Diagnosis not present

## 2018-12-16 DIAGNOSIS — F329 Major depressive disorder, single episode, unspecified: Secondary | ICD-10-CM | POA: Diagnosis present

## 2018-12-16 DIAGNOSIS — K921 Melena: Secondary | ICD-10-CM | POA: Diagnosis not present

## 2018-12-16 DIAGNOSIS — E785 Hyperlipidemia, unspecified: Secondary | ICD-10-CM | POA: Diagnosis present

## 2018-12-16 DIAGNOSIS — K922 Gastrointestinal hemorrhage, unspecified: Secondary | ICD-10-CM | POA: Diagnosis present

## 2018-12-16 DIAGNOSIS — D62 Acute posthemorrhagic anemia: Secondary | ICD-10-CM | POA: Diagnosis present

## 2018-12-16 DIAGNOSIS — J449 Chronic obstructive pulmonary disease, unspecified: Secondary | ICD-10-CM | POA: Diagnosis present

## 2018-12-16 DIAGNOSIS — E538 Deficiency of other specified B group vitamins: Secondary | ICD-10-CM | POA: Diagnosis not present

## 2018-12-16 DIAGNOSIS — K219 Gastro-esophageal reflux disease without esophagitis: Secondary | ICD-10-CM | POA: Diagnosis present

## 2018-12-16 DIAGNOSIS — K2990 Gastroduodenitis, unspecified, without bleeding: Secondary | ICD-10-CM | POA: Diagnosis present

## 2018-12-16 DIAGNOSIS — R195 Other fecal abnormalities: Secondary | ICD-10-CM | POA: Diagnosis not present

## 2018-12-16 DIAGNOSIS — K641 Second degree hemorrhoids: Secondary | ICD-10-CM | POA: Diagnosis present

## 2018-12-16 DIAGNOSIS — K2970 Gastritis, unspecified, without bleeding: Secondary | ICD-10-CM | POA: Diagnosis present

## 2018-12-16 DIAGNOSIS — N183 Chronic kidney disease, stage 3 unspecified: Secondary | ICD-10-CM | POA: Diagnosis present

## 2018-12-16 DIAGNOSIS — E119 Type 2 diabetes mellitus without complications: Secondary | ICD-10-CM

## 2018-12-16 DIAGNOSIS — IMO0002 Reserved for concepts with insufficient information to code with codable children: Secondary | ICD-10-CM | POA: Diagnosis present

## 2018-12-16 DIAGNOSIS — K31811 Angiodysplasia of stomach and duodenum with bleeding: Principal | ICD-10-CM | POA: Diagnosis present

## 2018-12-16 DIAGNOSIS — G8929 Other chronic pain: Secondary | ICD-10-CM | POA: Diagnosis present

## 2018-12-16 DIAGNOSIS — I252 Old myocardial infarction: Secondary | ICD-10-CM

## 2018-12-16 DIAGNOSIS — L89616 Pressure-induced deep tissue damage of right heel: Secondary | ICD-10-CM | POA: Diagnosis present

## 2018-12-16 DIAGNOSIS — Z8249 Family history of ischemic heart disease and other diseases of the circulatory system: Secondary | ICD-10-CM

## 2018-12-16 DIAGNOSIS — Z89412 Acquired absence of left great toe: Secondary | ICD-10-CM

## 2018-12-16 LAB — COMPREHENSIVE METABOLIC PANEL
ALT: 15 U/L (ref 0–44)
AST: 25 U/L (ref 15–41)
Albumin: 2.9 g/dL — ABNORMAL LOW (ref 3.5–5.0)
Alkaline Phosphatase: 93 U/L (ref 38–126)
Anion gap: 9 (ref 5–15)
BUN: 65 mg/dL — ABNORMAL HIGH (ref 8–23)
CO2: 25 mmol/L (ref 22–32)
Calcium: 8.2 mg/dL — ABNORMAL LOW (ref 8.9–10.3)
Chloride: 102 mmol/L (ref 98–111)
Creatinine, Ser: 1.97 mg/dL — ABNORMAL HIGH (ref 0.44–1.00)
GFR calc Af Amer: 30 mL/min — ABNORMAL LOW (ref >60)
GFR calc non Af Amer: 26 mL/min — ABNORMAL LOW (ref >60)
Glucose, Bld: 192 mg/dL — ABNORMAL HIGH (ref 70–99)
Potassium: 4.8 mmol/L (ref 3.5–5.1)
Sodium: 136 mmol/L (ref 135–145)
Total Bilirubin: 0.5 mg/dL (ref 0.3–1.2)
Total Protein: 7.6 g/dL (ref 6.5–8.1)

## 2018-12-16 LAB — POC OCCULT BLOOD, ED: Fecal Occult Bld: POSITIVE — AB

## 2018-12-16 LAB — GLUCOSE, CAPILLARY
Glucose-Capillary: 127 mg/dL — ABNORMAL HIGH (ref 70–99)
Glucose-Capillary: 133 mg/dL — ABNORMAL HIGH (ref 70–99)

## 2018-12-16 LAB — CBC WITH DIFFERENTIAL/PLATELET
Abs Immature Granulocytes: 0.02 10*3/uL (ref 0.00–0.07)
Basophils Absolute: 0 10*3/uL (ref 0.0–0.1)
Basophils Relative: 1 %
Eosinophils Absolute: 0.3 10*3/uL (ref 0.0–0.5)
Eosinophils Relative: 5 %
HCT: 16.8 % — ABNORMAL LOW (ref 36.0–46.0)
Hemoglobin: 4.8 g/dL — CL (ref 12.0–15.0)
Immature Granulocytes: 0 %
Lymphocytes Relative: 22 %
Lymphs Abs: 1.3 10*3/uL (ref 0.7–4.0)
MCH: 26.2 pg (ref 26.0–34.0)
MCHC: 28.6 g/dL — ABNORMAL LOW (ref 30.0–36.0)
MCV: 91.8 fL (ref 80.0–100.0)
Monocytes Absolute: 0.5 10*3/uL (ref 0.1–1.0)
Monocytes Relative: 9 %
Neutro Abs: 3.8 10*3/uL (ref 1.7–7.7)
Neutrophils Relative %: 63 %
Platelets: 133 10*3/uL — ABNORMAL LOW (ref 150–400)
RBC: 1.83 MIL/uL — ABNORMAL LOW (ref 3.87–5.11)
RDW: 19.1 % — ABNORMAL HIGH (ref 11.5–15.5)
WBC: 6 10*3/uL (ref 4.0–10.5)
nRBC: 0 % (ref 0.0–0.2)

## 2018-12-16 LAB — APTT: aPTT: 30 seconds (ref 24–36)

## 2018-12-16 LAB — AMMONIA: Ammonia: 20 umol/L (ref 9–35)

## 2018-12-16 LAB — PROTIME-INR
INR: 1.3 — ABNORMAL HIGH (ref 0.8–1.2)
Prothrombin Time: 15.6 seconds — ABNORMAL HIGH (ref 11.4–15.2)

## 2018-12-16 LAB — MRSA PCR SCREENING: MRSA by PCR: NEGATIVE

## 2018-12-16 LAB — PREPARE RBC (CROSSMATCH)

## 2018-12-16 MED ORDER — TORSEMIDE 20 MG PO TABS
20.0000 mg | ORAL_TABLET | Freq: Every day | ORAL | Status: DC
  Administered 2018-12-16 – 2019-01-02 (×17): 20 mg via ORAL
  Filled 2018-12-16 (×19): qty 1

## 2018-12-16 MED ORDER — ADULT MULTIVITAMIN W/MINERALS CH
1.0000 | ORAL_TABLET | Freq: Every day | ORAL | Status: DC
  Administered 2018-12-18 – 2019-01-02 (×15): 1 via ORAL
  Filled 2018-12-16 (×16): qty 1

## 2018-12-16 MED ORDER — MIRTAZAPINE 30 MG PO TBDP
30.0000 mg | ORAL_TABLET | Freq: Every day | ORAL | Status: DC
  Administered 2018-12-16 – 2019-01-01 (×15): 30 mg via ORAL
  Filled 2018-12-16 (×19): qty 1

## 2018-12-16 MED ORDER — AMMONIUM LACTATE 12 % EX LOTN
1.0000 "application " | TOPICAL_LOTION | Freq: Two times a day (BID) | CUTANEOUS | Status: DC
  Administered 2018-12-16 – 2019-01-02 (×30): 1 via TOPICAL
  Filled 2018-12-16 (×2): qty 225

## 2018-12-16 MED ORDER — ATORVASTATIN CALCIUM 40 MG PO TABS
40.0000 mg | ORAL_TABLET | Freq: Every day | ORAL | Status: DC
  Administered 2018-12-17 – 2019-01-01 (×16): 40 mg via ORAL
  Filled 2018-12-16 (×17): qty 1

## 2018-12-16 MED ORDER — INSULIN ASPART 100 UNIT/ML ~~LOC~~ SOLN
0.0000 [IU] | SUBCUTANEOUS | Status: DC
  Administered 2018-12-16 – 2018-12-17 (×2): 1 [IU] via SUBCUTANEOUS
  Administered 2018-12-17: 2 [IU] via SUBCUTANEOUS
  Administered 2018-12-17: 1 [IU] via SUBCUTANEOUS
  Administered 2018-12-18 (×2): 2 [IU] via SUBCUTANEOUS
  Administered 2018-12-18: 05:00:00 1 [IU] via SUBCUTANEOUS
  Administered 2018-12-20: 2 [IU] via SUBCUTANEOUS
  Administered 2018-12-20 – 2018-12-21 (×3): 1 [IU] via SUBCUTANEOUS
  Administered 2018-12-21: 2 [IU] via SUBCUTANEOUS
  Administered 2018-12-21: 21:00:00 1 [IU] via SUBCUTANEOUS
  Administered 2018-12-22 (×2): 2 [IU] via SUBCUTANEOUS
  Administered 2018-12-22 – 2018-12-23 (×2): 1 [IU] via SUBCUTANEOUS
  Administered 2018-12-23: 2 [IU] via SUBCUTANEOUS
  Administered 2018-12-23: 17:00:00 1 [IU] via SUBCUTANEOUS
  Administered 2018-12-24: 2 [IU] via SUBCUTANEOUS
  Administered 2018-12-24: 20:00:00 1 [IU] via SUBCUTANEOUS
  Administered 2018-12-24: 3 [IU] via SUBCUTANEOUS
  Administered 2018-12-24 – 2018-12-25 (×3): 1 [IU] via SUBCUTANEOUS
  Administered 2018-12-25: 5 [IU] via SUBCUTANEOUS
  Administered 2018-12-25: 01:00:00 2 [IU] via SUBCUTANEOUS
  Administered 2018-12-27 – 2018-12-28 (×2): 1 [IU] via SUBCUTANEOUS
  Administered 2018-12-29: 10:00:00 3 [IU] via SUBCUTANEOUS
  Administered 2018-12-29: 1 [IU] via SUBCUTANEOUS
  Administered 2018-12-29: 21:00:00 3 [IU] via SUBCUTANEOUS
  Administered 2018-12-29: 12:00:00 5 [IU] via SUBCUTANEOUS
  Administered 2018-12-30 (×3): 2 [IU] via SUBCUTANEOUS
  Administered 2018-12-30: 3 [IU] via SUBCUTANEOUS
  Administered 2018-12-30: 2 [IU] via SUBCUTANEOUS
  Administered 2018-12-30 – 2018-12-31 (×4): 1 [IU] via SUBCUTANEOUS
  Administered 2018-12-31: 12:00:00 3 [IU] via SUBCUTANEOUS
  Administered 2018-12-31 – 2019-01-01 (×3): 1 [IU] via SUBCUTANEOUS
  Administered 2019-01-01 (×2): 2 [IU] via SUBCUTANEOUS
  Administered 2019-01-01: 1 [IU] via SUBCUTANEOUS
  Administered 2019-01-01 – 2019-01-02 (×2): 3 [IU] via SUBCUTANEOUS
  Administered 2019-01-02: 2 [IU] via SUBCUTANEOUS

## 2018-12-16 MED ORDER — ACETAMINOPHEN 650 MG RE SUPP: 650.0000 mg | Freq: Four times a day (QID) | RECTAL | Status: DC | PRN

## 2018-12-16 MED ORDER — ONDANSETRON HCL 4 MG PO TABS: 4.0000 mg | ORAL_TABLET | Freq: Four times a day (QID) | ORAL | Status: DC | PRN

## 2018-12-16 MED ORDER — PANTOPRAZOLE SODIUM 40 MG IV SOLR
40.0000 mg | Freq: Two times a day (BID) | INTRAVENOUS | Status: DC
  Administered 2018-12-20 – 2018-12-22 (×5): 40 mg via INTRAVENOUS
  Filled 2018-12-16 (×5): qty 40

## 2018-12-16 MED ORDER — FOLIC ACID 1 MG PO TABS
1.0000 mg | ORAL_TABLET | Freq: Every day | ORAL | Status: DC
  Administered 2018-12-18 – 2019-01-02 (×15): 1 mg via ORAL
  Filled 2018-12-16 (×16): qty 1

## 2018-12-16 MED ORDER — TRAMADOL HCL 50 MG PO TABS
100.0000 mg | ORAL_TABLET | Freq: Four times a day (QID) | ORAL | Status: DC | PRN
  Filled 2018-12-16: qty 2

## 2018-12-16 MED ORDER — SODIUM CHLORIDE 0.9 % IV SOLN
INTRAVENOUS | Status: DC
  Administered 2018-12-16 – 2018-12-20 (×3): via INTRAVENOUS

## 2018-12-16 MED ORDER — SODIUM CHLORIDE 0.9% FLUSH
3.0000 mL | Freq: Two times a day (BID) | INTRAVENOUS | Status: DC
  Administered 2018-12-16 – 2018-12-29 (×9): 3 mL via INTRAVENOUS

## 2018-12-16 MED ORDER — ACETAMINOPHEN 325 MG PO TABS: 650.0000 mg | ORAL_TABLET | Freq: Four times a day (QID) | ORAL | Status: DC | PRN

## 2018-12-16 MED ORDER — SODIUM CHLORIDE 0.9% FLUSH
3.0000 mL | Freq: Two times a day (BID) | INTRAVENOUS | Status: DC
  Administered 2018-12-16 – 2018-12-30 (×22): 3 mL via INTRAVENOUS

## 2018-12-16 MED ORDER — PRO-STAT SUGAR FREE PO LIQD
30.0000 mL | Freq: Two times a day (BID) | ORAL | Status: DC
  Administered 2018-12-16 – 2018-12-20 (×5): 30 mL via ORAL
  Filled 2018-12-16 (×6): qty 30

## 2018-12-16 MED ORDER — SODIUM CHLORIDE 0.9 % IV SOLN
80.0000 mg | Freq: Once | INTRAVENOUS | Status: AC
  Administered 2018-12-16: 80 mg via INTRAVENOUS
  Filled 2018-12-16: qty 80

## 2018-12-16 MED ORDER — LACTULOSE 10 GM/15ML PO SOLN
20.0000 g | Freq: Three times a day (TID) | ORAL | Status: DC
  Administered 2018-12-18 – 2019-01-02 (×37): 20 g via ORAL
  Filled 2018-12-16 (×39): qty 30

## 2018-12-16 MED ORDER — ONDANSETRON HCL 4 MG/2ML IJ SOLN
4.0000 mg | Freq: Four times a day (QID) | INTRAMUSCULAR | Status: DC | PRN
  Administered 2018-12-19: 4 mg via INTRAVENOUS

## 2018-12-16 MED ORDER — SENNOSIDES-DOCUSATE SODIUM 8.6-50 MG PO TABS: 1.0000 | ORAL_TABLET | Freq: Every evening | ORAL | Status: DC | PRN

## 2018-12-16 MED ORDER — SORBITOL 70 % SOLN
30.0000 mL | Freq: Every day | Status: DC | PRN
  Filled 2018-12-16: qty 30

## 2018-12-16 MED ORDER — SPIRONOLACTONE 12.5 MG HALF TABLET
12.5000 mg | ORAL_TABLET | Freq: Every day | ORAL | Status: DC
  Administered 2018-12-17 – 2019-01-02 (×16): 12.5 mg via ORAL
  Filled 2018-12-16 (×17): qty 1

## 2018-12-16 MED ORDER — ALBUTEROL SULFATE (2.5 MG/3ML) 0.083% IN NEBU: 2.5000 mg | INHALATION_SOLUTION | RESPIRATORY_TRACT | Status: DC | PRN

## 2018-12-16 MED ORDER — SODIUM CHLORIDE 0.9 % IV SOLN
8.0000 mg/h | INTRAVENOUS | Status: AC
  Administered 2018-12-16 – 2018-12-19 (×5): 8 mg/h via INTRAVENOUS
  Filled 2018-12-16 (×10): qty 80

## 2018-12-16 MED ORDER — SODIUM CHLORIDE 0.9% FLUSH: 3.0000 mL | INTRAVENOUS | Status: DC | PRN

## 2018-12-16 MED ORDER — CARVEDILOL 3.125 MG PO TABS
3.1250 mg | ORAL_TABLET | Freq: Two times a day (BID) | ORAL | Status: DC
  Administered 2018-12-17 – 2019-01-02 (×30): 3.125 mg via ORAL
  Filled 2018-12-16 (×34): qty 1

## 2018-12-16 MED ORDER — SODIUM CHLORIDE 0.9 % IV SOLN: 10.0000 mL/h | Freq: Once | INTRAVENOUS | Status: DC

## 2018-12-16 MED ORDER — CHLORHEXIDINE GLUCONATE CLOTH 2 % EX PADS
6.0000 | MEDICATED_PAD | Freq: Every day | CUTANEOUS | Status: DC
  Administered 2018-12-16 – 2019-01-02 (×13): 6 via TOPICAL

## 2018-12-16 MED ORDER — FLUTICASONE PROPIONATE 50 MCG/ACT NA SUSP
2.0000 | Freq: Every day | NASAL | Status: DC
  Administered 2018-12-18 – 2019-01-02 (×14): 2 via NASAL
  Filled 2018-12-16: qty 16

## 2018-12-16 MED ORDER — SODIUM CHLORIDE 0.9 % IV SOLN: 250.0000 mL | INTRAVENOUS | Status: DC | PRN

## 2018-12-16 NOTE — H&P (Signed)
History and Physical    [name] LHT:[id number] DOB: 10/24/1955 DOA: 12/16/2018  PCP: [place]  Patient coming from: [facility] facility  I have personally briefly reviewed patient's old medical records in [place]  Chief Complaint: Rectal bleeding  HPI: [name] is a 63 y.o. female with medical history significant of COPD with ongoing tobacco use, history of gastric ulcer, diabetes mellitus, chronic back pain, CHF, iron deficiency anemia due to chronic blood loss, chronic systolic heart failure, prior history of non-STEMI, history of chronic hepatitis C who was recently hospitalized from 11/20/2018-12/07/2018 from the internal medicine teaching service for GI bleed presumed to be from a AVM.  Patient presenting from nursing facility with with 2-day history of maroon-colored bloody bowel movements.  Patient denies any fevers, no chills, no nausea, no vomiting, no chest pain, no shortness of breath no diarrhea, no constipation, no dysuria, no dizziness, no syncope, no hematemesis, no asymmetric weakness or numbness.  ED Course: Patient seen in the ED, comprehensive metabolic profile obtained with a BUN of 65, creatinine of 1.97, glucose of 192, albumin of 2.9 otherwise was within normal limits.  CBC with a hemoglobin of 4.8, MCV of 91.8, RDW of 19.1, platelet count of 133 otherwise was within normal limits.  INR noted to be at 1.3.  2 units of packed red blood cells was ordered and ED called hospitalist for admission.  Per ED physician GI consultation pending.  Review of Systems: As per HPI otherwise 10 point review of systems negative.   Past Medical History:  Diagnosis Date  . Cervical cancer ([place])   . CHF (congestive heart failure) ([place])   . Chronic anemia   . COPD (chronic obstructive pulmonary disease) ([place])   . Diabetes mellitus without complication ([place])   . Gastric ulcer   . NSTEMI (non-ST elevated myocardial infarction) ([place])    . Upper GI bleed     Past Surgical History:  Procedure Laterality Date  . AMPUTATION Left 06/24/2018   Procedure: AMPUTATION GREAT TOE;  Surgeon: Samara Deist, DPM;  Location: [facility] ORS;  Service: Podiatry;  Laterality: Left;  . APPENDECTOMY    . BIOPSY  11/22/2018   Procedure: BIOPSY;  Surgeon: Thornton Park, MD;  Location: [place];  Service: Gastroenterology;;  . CARDIAC CATHETERIZATION    . CHOLECYSTECTOMY    . COLONOSCOPY N/A 09/28/2017   Procedure: COLONOSCOPY;  Surgeon: Toledo, Benay Pike, MD;  Location: [facility] ENDOSCOPY;  Service: Gastroenterology;  Laterality: N/A;  . COLONOSCOPY WITH PROPOFOL N/A 08/21/2018   Procedure: COLONOSCOPY WITH PROPOFOL;  Surgeon: Lin Landsman, MD;  Location: [facility] ENDOSCOPY;  Service: Gastroenterology;  Laterality: N/A;  . ENTEROSCOPY Left 11/06/2017   Procedure: ENTEROSCOPY;  Surgeon: Jonathon Bellows, MD;  Location[facility] ENDOSCOPY;  Service: Gastroenterology;  Laterality: Left;  . ENTEROSCOPY N/A 06/22/2018   Procedure: ENTEROSCOPY;  Surgeon: Lin Landsman, MD;  Location[facility]COPY;  Service: Gastroenterology;  Laterality: N/A;  . ENTEROSCOPY N/A 08/18/2018   Procedure: ENTEROSCOPY;  Surgeon: Jonathon Bellows, MD;  Location: [facility] ENDOSCOPY;  Service: Gastroenterology;  Laterality: N/A;  . ENTEROSCOPY N/A 11/01/2018   Procedure: ENTEROSCOPY;  Surgeon: Virgel Manifold, MD;  Location: [facility] ENDOSCOPY;  Service: Endoscopy;  Laterality: N/A;  . ENTEROSCOPY N/A 11/22/2018   Procedure: ENTEROSCOPY;  Surgeon: Thornton Park, MD;  Location: [facility];  Service: Gastroenterology;  Laterality: N/A;  . ESOPHAGOGASTRODUODENOSCOPY N/A 12/19/2016   Procedure: ESOPHAGOGASTRODUODENOSCOPY (EGD);  Surgeon: Lin Landsman, MD;  Location: [facility] ENDOSCOPY;  Service: Gastroenterology;  Laterality: N/A;  . ESOPHAGOGASTRODUODENOSCOPY N/A 09/28/2017   Procedure: ESOPHAGOGASTRODUODENOSCOPY (EGD);  Surgeon: Toledo, Benay Pike, MD;  Location: [facility] ENDOSCOPY;   Service: Gastroenterology;  Laterality: N/A;  . ESOPHAGOGASTRODUODENOSCOPY (EGD) WITH PROPOFOL N/A 08/08/2017   Procedure: ESOPHAGOGASTRODUODENOSCOPY (EGD) WITH PROPOFOL;  Surgeon: [name], MD;  Location: [facility] ENDOSCOPY;  Service: Endoscopy;  Laterality: N/A;  . GIVENS CAPSULE STUDY N/A 10/28/2018   Procedure: GIVENS CAPSULE STUDY;  Surgeon: Jonathon Bellows, MD;  Location: [facility] ENDOSCOPY;  Service: Gastroenterology;  Laterality: N/A;  . heart [place]    . LEFT HEART CATH AND CORONARY ANGIOGRAPHY N/A 12/23/2016   Procedure: LEFT HEART CATH AND CORONARY ANGIOGRAPHY;  Surgeon: Teodoro Spray, MD;  Location: [place] CV LAB;  Service: Cardiovascular;  Laterality: N/A;  . LOWER EXTREMITY ANGIOGRAPHY Left 06/26/2018   Procedure: Lower Extremity Angiography;  Surgeon: Katha Cabal, MD;  Location: [place] CV LAB;  Service: Cardiovascular;  Laterality: Left;  . TEE WITHOUT CARDIOVERSION N/A 10/29/2018   Procedure: TRANSESOPHAGEAL ECHOCARDIOGRAM (TEE);  Surgeon: Teodoro Spray, MD;  Location: [facility] ORS;  Service: Cardiovascular;  Laterality: N/A;     reports that she has been smoking cigarettes. She has been smoking about 0.25 packs per day. She has never used smokeless tobacco. She reports that she does not drink alcohol or use drugs.  Allergies  Allergen Reactions  . Latex Anaphylaxis and Rash  . Gabapentin Nausea And Vomiting and Other (See Comments)    Family History  Problem Relation Age of Onset  . Hypertension Mother   . Diabetes Mellitus II Mother   . Breast cancer Mother   . Heart disease Father   . Cancer Sister    Mother deceased in his 75s from acute MI.  Father deceased in his 51s from acute MI per patient.  Prior to Admission medications   Medication Sig Start Date End Date Taking? Authorizing Provider  Amino Acids-Protein Hydrolys (FEEDING SUPPLEMENT, PRO-STAT SUGAR FREE 64,) LIQD Take 30 mLs by mouth 2 (two) times daily. 12/07/18  Yes Al Decant, MD  ammonium lactate (AMLACTIN) 12 % cream Apply 1 g topically 2 (two) times daily.   Yes [provider]  atorvastatin (LIPITOR) 40 MG tablet Take 40 mg by mouth daily.   Yes [provider]  carvedilol (COREG) 3.125 MG tablet Take 1 tablet (3.125 mg total) by mouth 2 (two) times daily with a meal. 08/21/18  Yes Ojie, Jude, MD  fluticasone (FLONASE) 50 MCG/ACT nasal spray Place 2 sprays into both nostrils daily.   Yes [provider]  folic acid (FOLVITE) 1 MG tablet Take 1 tablet (1 mg total) by mouth daily. 08/21/18  Yes Ojie, Jude, MD  lactulose (CHRONULAC) 10 GM/15ML solution Take 30 mLs (20 g total) by mouth 3 (three) times daily. 12/07/18  Yes Al Decant, MD  mirtazapine (REMERON SOL-TAB) 30 MG disintegrating tablet Take 1 tablet (30 mg total) by mouth at bedtime. 12/07/18  Yes Al Decant, MD  Multiple Vitamin (MULTIVITAMIN WITH MINERALS) TABS tablet Take 1 tablet by mouth daily. 06/28/18  Yes Lang Snow, NP  Nutritional Supplements (RESOURCE 2.0 PO) Take 1 Bottle by mouth 2 (two) times daily.   Yes [provider]  pantoprazole (PROTONIX) 40 MG tablet Take 1 tablet (40 mg total) by mouth daily. 12/07/18  Yes Al Decant, MD  spironolactone (ALDACTONE) 25 MG tablet Take 12.5 mg by mouth daily.   Yes [provider]  torsemide (DEMADEX) 20 MG  tablet Take 20 mg by mouth daily. Take 2 tablets if weight is greater than 185 lbs. NO tablets if weight is less than 177 lbs.   Yes [provider]    Physical Exam: Vitals:   12/16/18 1149 12/16/18 1634 12/16/18 1637 12/16/18 1653  BP: 109/89 (!) 106/42 (!) 106/42 (!) 105/53  Pulse: 85 92  84  Resp: 17 (!) 23 (!) 21 16  Temp: 97.9 F (36.6 C) 97.9 F (36.6 C)  98.1 F (36.7 C)  TempSrc: Oral Oral  Oral  SpO2: 99%  100%     Constitutional: NAD, calm, comfortable Vitals:   12/16/18 1149 12/16/18 1634 12/16/18 1637 12/16/18 1653  BP: 109/89 (!) 106/42 (!) 106/42  (!) 105/53  Pulse: 85 92  84  Resp: 17 (!) 23 (!) 21 16  Temp: 97.9 F (36.6 C) 97.9 F (36.6 C)  98.1 F (36.7 C)  TempSrc: Oral Oral  Oral  SpO2: 99%  100%    Eyes: PERRL, lids and conjunctivae normal ENMT: Mucous membranes are moist. Posterior pharynx clear of any exudate or lesions.Normal dentition.  Neck: normal, supple, no masses, no thyromegaly Respiratory: clear to auscultation bilaterally, no wheezing, no crackles. Normal respiratory effort. No accessory muscle use.  Cardiovascular: Regular rate and rhythm, no murmurs / rubs / gallops.  2+ bilateral lower extremity edema.  No carotid bruits.  Abdomen: Soft, nontender, nondistended, positive bowel sounds.  No rebound.  No guarding.  Musculoskeletal: no clubbing / cyanosis. No joint deformity upper and lower extremities. Good ROM, no contractures. Normal muscle tone.  Skin: no rashes, lesions, ulcers. No induration Neurologic: CN 2-12 grossly intact. Sensation intact, DTR normal. Strength 5/5 in all 4.  Psychiatric: Normal judgment and insight. Alert and oriented x 3. Normal mood.   Labs on Admission: I have personally reviewed following labs and imaging studies  CBC: Recent Labs  Lab 12/16/18 1304  WBC 6.0  NEUTROABS 3.8  HGB 4.8*  HCT 16.8*  MCV 91.8  PLT 453*   Basic Metabolic Panel: Recent Labs  Lab 12/16/18 1304  NA 136  K 4.8  CL 102  CO2 25  GLUCOSE 192*  BUN 65*  CREATININE 1.97*  CALCIUM 8.2*   GFR: Estimated Creatinine Clearance: 36.5 mL/min (A) (by C-G formula based on SCr of 1.97 mg/dL (H)). Liver Function Tests: Recent Labs  Lab 12/16/18 1304  AST 25  ALT 15  ALKPHOS 93  BILITOT 0.5  PROT 7.6  ALBUMIN 2.9*   No results for input(s): LIPASE, AMYLASE in the last 168 hours. Recent Labs  Lab 12/16/18 1304  AMMONIA 20   Coagulation Profile: Recent Labs  Lab 12/16/18 1304  INR 1.3*   Cardiac Enzymes: No results for input(s): CKTOTAL, CKMB, CKMBINDEX, TROPONINI in the last 168  hours. BNP (last 3 results) No results for input(s): PROBNP in the last 8760 hours. HbA1C: No results for input(s): HGBA1C in the last 72 hours. CBG: No results for input(s): GLUCAP in the last 168 hours. Lipid Profile: No results for input(s): CHOL, HDL, LDLCALC, TRIG, CHOLHDL, LDLDIRECT in the last 72 hours. Thyroid Function Tests: No results for input(s): TSH, T4TOTAL, FREET4, T3FREE, THYROIDAB in the last 72 hours. Anemia Panel: No results for input(s): VITAMINB12, FOLATE, FERRITIN, TIBC, IRON, RETICCTPCT in the last 72 hours. Urine analysis:    Component Value Date/Time   COLORURINE YELLOW 11/20/2018 1320   APPEARANCEUR CLEAR 11/20/2018 1320   APPEARANCEUR Clear 08/03/2013 2138   LABSPEC 1.009 11/20/2018 1320   LABSPEC  1.028 08/03/2013 2138   PHURINE 7.0 11/20/2018 1320   GLUCOSEU NEGATIVE 11/20/2018 1320   GLUCOSEU Negative 08/03/2013 2138   HGBUR NEGATIVE 11/20/2018 1320   BILIRUBINUR NEGATIVE 11/20/2018 1320   BILIRUBINUR Negative 08/03/2013 2138   KETONESUR NEGATIVE 11/20/2018 1320   PROTEINUR NEGATIVE 11/20/2018 1320   UROBILINOGEN 0.2 09/27/2006 2132   NITRITE NEGATIVE 11/20/2018 1320   LEUKOCYTESUR NEGATIVE 11/20/2018 1320   LEUKOCYTESUR Negative 08/03/2013 2138    Radiological Exams on Admission: No results found.  EKG: Not done  Assessment/Plan Principal Problem:   GI bleed Active Problems:   Chronic systolic heart failure (HCC)   Diabetes (HCC)   COPD (chronic obstructive pulmonary disease) (HCC)   Symptomatic anemia   GIB (gastrointestinal bleeding)   B12 deficiency   Iron deficiency anemia due to chronic blood loss   Folate deficiency   Anxiety   CAD (coronary artery disease)   Chronic back pain   Depression   Diabetes mellitus type 2, uncontrolled (HCC)   GERD (gastroesophageal reflux disease)   Hyperlipidemia   Hypertension   Obesity   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HCC)   Gastritis and gastroduodenitis   1 GI bleed/acute blood loss  anemia Questionable etiology.  Concern for probable AVM.  Patient presented with maroon-colored stools x2 days per patient from skilled nursing facility.  FOBT noted to be positive.  Hemoglobin on admission 4.8.  Patient noted to have recently been hospitalized from 11/20/2018 - 12/07/2018 for GI bleed felt to be probably from AVM noted on recent capsule endoscopy.  During that hospitalization small bowel endoscopy was attempted however did not reveal etiology of bleeding despite continued GI bleed.  Patient received a total of 5 units of packed red blood cells during that hospitalization and hemoglobin GI bleed subsequently resolved on hospital day 10.  Patient with history of gastritis and gastroduodenitis.  2 units of packed red blood cell transfusion has been ordered per ED physician.  Check serial CBCs.  Check an anemia panel.  Keep n.p.o. pending GI evaluation.  Placed on a Protonix drip.  GI has been consulted per ED physician.  Supportive care.  Follow.  2.  Gastroesophageal reflux disease PPI.  3.  Hypertension Continue home regimen Coreg, spironolactone, Demadex.  4.  Well-controlled diabetes mellitus type 2 Hemoglobin A1c noted to be 5.9 on 06/21/2018.  Repeat a hemoglobin A1c.  Placed on sliding scale insulin.  5.  Recent hepatic encephalopathy Noted during prior hospitalization.  Patient noted per discharge summary to have improved on lactulose and Xifaxan.  Patient currently on lactulose which we will continue for now.  Follow.  6.  Chronic systolic heart failure/restrictive cardiomyopathy/coronary artery disease Stable.  Patient without any complaints of chest pain or shortness of breath.  Patient noted to have 2+ lower extremity edema which she states is chronic.  Will resume patient's home regimen of Coreg, Aldactone, Demadex, Lipitor.  Monitor closely volume status as patient being transfused 2 units of packed red blood cells.  Strict I's and O's.  Daily weights.  7.  Folate  deficiency Folic acid 1 mg daily.  8.  COPD with ongoing tobacco use Stable.  Resume home regimen Flonase, PPI.  Tobacco cessation stressed to patient.  Patient refusing a nicotine patch at this time.  Placed on Dulera, Spiriva, duo nebs as needed.  DVT prophylaxis: SCDs Code Status: Full Family Communication: Updated patient.  No family at bedside. Disposition Plan: Likely back to skilled nursing facility once medically stable, cessation of bleeding, when cleared by  GI. Consults called: Gastroenterology per ED physician Admission status: Admit to stepdown unit   Irine Seal MD [facility]  If 7PM-7AM, please contact night-coverage [email or web address]  12/16/2018, 5:03 PM

## 2018-12-16 NOTE — ED Notes (Signed)
Consent signed with Dr. Vanita Ingles.

## 2018-12-16 NOTE — ED Triage Notes (Signed)
Per [facility]; pt has increased stools red in color as well as increased confusion. Pt had a negative hemoccult on 12/15/2018. Pt is also rx'd lactulose.

## 2018-12-16 NOTE — Consult Note (Addendum)
Cross cover [employer] patient Reason for Consult: Rectal bleeding. Referring Physician: Emmie [name], ER MD-[place]  [name] is an 63 y.o. female.  HPI: [name] is a 63 year old black female, admitted to [place] to the emergency room today when she presented with recurrent rectal bleeding. On reviewing her chart she had multiple endoscopies and a colonoscopy done last year. The colonoscopy essentially unrevealing for internal hemorrhoids. On one of the endoscopy she was found to have some oozing gastric ulcers.  She has had multiple small bowel enteroscopy is done with questionable AVM noted on a capsule study.  I was not able to find that VCE in Epic. On a small bowel study done by Dr. 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 Earlier this month Dr. Thornton [name] did a small bowel enteroscopy on 11/22/2018 when no small bowel AVMs were noted and no definite source of bleeding was identified.  She is somewhat confused at the present time and is not unable to give me much details on her history.  She tells me she started bleeding earlier today and had some abdominal discomfort but cannot give me any other details.  She denies having a syncopal or near syncopal event.  According to the review of her chart there is no history of chest pain nausea vomiting dizziness syncope or hematemesis.  She has a history of chronic hepatitis C gastric ulcers chronic back pain CHF iron deficiency anemia due to chronic blood loss diabetes and COPD with ongoing tobacco abuse.  In the emergency room she was found to have a BUN of 65 a creatinine of 1.97 and hemoglobin of 4.8 and MCV of 91.8 with platelets of 133K.  Patient also had a negative bleeding scan on 11/26/2018.  Past Medical History:  Diagnosis Date  . Cervical cancer ([place])   . CHF (congestive heart failure) ([place])   . Chronic anemia   . COPD (chronic obstructive pulmonary disease) ([place])    . Diabetes mellitus without complication ([place])   . Gastric ulcer   . NSTEMI (non-ST elevated myocardial infarction) ([place])   . Upper GI bleed    Past Surgical History:  Procedure Laterality Date  . AMPUTATION Left 06/24/2018   Procedure: AMPUTATION GREAT TOE;  Surgeon: Samara Deist, DPM;  Location: [facility];  Service: Podiatry;  Laterality: Left;  . APPENDECTOMY    . BIOPSY  11/22/2018   Procedure: BIOPSY;  Surgeon: Tanya Park, MD;  Location: [place];  Service: Gastroenterology;;  . CARDIAC CATHETERIZATION    . CHOLECYSTECTOMY    . COLONOSCOPY N/A 09/28/2017   Procedure: COLONOSCOPY;  Surgeon: Toledo, Benay Pike, MD;  Location: [facility] ENDOSCOPY;  Service: Gastroenterology;  Laterality: N/A;  . COLONOSCOPY WITH PROPOFOL N/A 08/21/2018   Procedure: COLONOSCOPY WITH PROPOFOL;  Surgeon: Lin Landsman, MD;  Location: [facility] ENDOSCOPY;  Service: Gastroenterology;  Laterality: N/A;  . ENTEROSCOPY Left 11/06/2017   Procedure: ENTEROSCOPY;  Surgeon: Jonathon Bellows, MD;  Location: [facility]COPY;  Service: Gastroenterology;  Laterality: Left;  . ENTEROSCOPY N/A 06/22/2018   Procedure: ENTEROSCOPY;  Surgeon: Lin Landsman, MD;  Location: [facility]COPY;  Service: Gastroenterology;  Laterality: N/A;  . ENTEROSCOPY N/A 08/18/2018   Procedure: ENTEROSCOPY;  Surgeon: Jonathon Bellows, MD;  Location: [facility] ENDOSCOPY;  Service: Gastroenterology;  Laterality: N/A;  . ENTEROSCOPY N/A 11/01/2018   Procedure: ENTEROSCOPY;  Surgeon: Virgel Manifold, MD;  Location: [facility] ENDOSCOPY;  Service: Endoscopy;  Laterality: N/A;  .  ENTEROSCOPY N/A 11/22/2018   Procedure: ENTEROSCOPY;  Surgeon: Tanya Park, MD;  Location: [place];  Service: Gastroenterology;  Laterality: N/A;  . ESOPHAGOGASTRODUODENOSCOPY N/A 12/19/2016   Procedure: ESOPHAGOGASTRODUODENOSCOPY (EGD);  Surgeon: Lin Landsman, MD;  Location: [facility] ENDOSCOPY;  Service: Gastroenterology;  Laterality: N/A;  . ESOPHAGOGASTRODUODENOSCOPY  N/A 09/28/2017   Procedure: ESOPHAGOGASTRODUODENOSCOPY (EGD);  Surgeon: Toledo, Benay Pike, MD;  Location: [facility] ENDOSCOPY;  Service: Gastroenterology;  Laterality: N/A;  . ESOPHAGOGASTRODUODENOSCOPY (EGD) WITH PROPOFOL N/A 08/08/2017   Procedure: ESOPHAGOGASTRODUODENOSCOPY (EGD) WITH PROPOFOL;  Surgeon: [name], MD;  Location: [facility] ENDOSCOPY;  Service: Endoscopy;  Laterality: N/A;  . GIVENS CAPSULE STUDY N/A 10/28/2018   Procedure: GIVENS CAPSULE STUDY;  Surgeon: Jonathon Bellows, MD;  Location: [facility] ENDOSCOPY;  Service: Gastroenterology;  Laterality: N/A;  . [place]    . LEFT HEART CATH AND CORONARY ANGIOGRAPHY N/A 12/23/2016   Procedure: LEFT HEART CATH AND CORONARY ANGIOGRAPHY;  Surgeon: Teodoro Spray, MD;  Location: [place] CV LAB;  Service: Cardiovascular;  Laterality: N/A;  . LOWER EXTREMITY ANGIOGRAPHY Left 06/26/2018   Procedure: Lower Extremity Angiography;  Surgeon: Katha Cabal, MD;  Location: [place] CV LAB;  Service: Cardiovascular;  Laterality: Left;  . TEE WITHOUT CARDIOVERSION N/A 10/29/2018   Procedure: TRANSESOPHAGEAL ECHOCARDIOGRAM (TEE);  Surgeon: Teodoro Spray, MD;  Location: [facility] ORS;  Service: Cardiovascular;  Laterality: N/A;   Family History  Problem Relation Age of Onset  . Hypertension Mother   . Diabetes Mellitus II Mother   . Breast cancer Mother   . Heart disease Father   . Cancer Sister    Social History:  reports that she has been smoking cigarettes. She has been smoking about 0.25 packs per day. She has never used smokeless tobacco. She reports that she does not drink alcohol or use drugs.  Allergies:  Allergies  Allergen Reactions  . Latex Anaphylaxis and Rash  . Gabapentin Nausea And Vomiting and Other (See Comments)   Medications: I have reviewed the patient's current medications.  Results for orders placed or performed during the hospital encounter of 12/16/18 (from the past 48 hour(s))  CBC with Differential      Status: Abnormal   Collection Time: 12/16/18  1:04 PM  Result Value Ref Range   WBC 6.0 4.0 - 10.5 K/uL   RBC 1.83 (L) 3.87 - 5.11 MIL/uL   Hemoglobin 4.8 (LL) 12.0 - 15.0 g/dL    Comment: REPEATED TO VERIFY THIS CRITICAL RESULT HAS VERIFIED AND BEEN CALLED TO SMALLWOOD,CHERRELLE BY JESSICA PERRY ON 11 29 2020 AT 1353, AND HAS BEEN READ BACK. CRITICAL CALLED AND VERIFIED    HCT 16.8 (L) 36.0 - 46.0 %   MCV 91.8 80.0 - 100.0 fL   MCH 26.2 26.0 - 34.0 pg   MCHC 28.6 (L) 30.0 - 36.0 g/dL   RDW 19.1 (H) 11.5 - 15.5 %   Platelets 133 (L) 150 - 400 K/uL   nRBC 0.0 0.0 - 0.2 %   Neutrophils Relative % 63 %   Neutro Abs 3.8 1.7 - 7.7 K/uL   Lymphocytes Relative 22 %   Lymphs Abs 1.3 0.7 - 4.0 K/uL   Monocytes Relative 9 %   Monocytes Absolute 0.5 0.1 - 1.0 K/uL   Eosinophils Relative 5 %   Eosinophils Absolute 0.3 0.0 - 0.5 K/uL   Basophils Relative 1 %   Basophils Absolute 0.0 0.0 - 0.1 K/uL   Immature Granulocytes 0 %   Abs Immature Granulocytes  0.02 0.00 - 0.07 K/uL    Comment: Performed at [facility], [place] [address], [name], [place] [address]  CMET     Status: Abnormal   Collection Time: 12/16/18  1:04 PM  Result Value Ref Range   Sodium 136 135 - 145 mmol/L   Potassium 4.8 3.5 - 5.1 mmol/L   Chloride 102 98 - 111 mmol/L   CO2 25 22 - 32 mmol/L   Glucose, Bld 192 (H) 70 - 99 mg/dL   BUN 65 (H) 8 - 23 mg/dL   Creatinine, Ser 1.97 (H) 0.44 - 1.00 mg/dL   Calcium 8.2 (L) 8.9 - 10.3 mg/dL   Total Protein 7.6 6.5 - 8.1 g/dL   Albumin 2.9 (L) 3.5 - 5.0 g/dL   AST 25 15 - 41 U/L   ALT 15 0 - 44 U/L   Alkaline Phosphatase 93 38 - 126 U/L   Total Bilirubin 0.5 0.3 - 1.2 mg/dL   GFR calc non Af Amer 26 (L) >60 mL/min   GFR calc Af Amer 30 (L) >60 mL/min   Anion gap 9 5 - 15    Comment: Performed at [facility], [place] [address] [place], [place] [address]  Ammonia     Status: None   Collection Time: 12/16/18  1:04 PM  Result Value Ref  Range   Ammonia 20 9 - 35 umol/L    Comment: Performed at [facility], [place] [address], [place], [place] [address]  PT     Status: Abnormal   Collection Time: 12/16/18  1:04 PM  Result Value Ref Range   Prothrombin Time 15.6 (H) 11.4 - 15.2 seconds   INR 1.3 (H) 0.8 - 1.2    Comment: (NOTE) INR goal varies based on device and disease states. Performed at [facility], [place] [address], [place], [place] [address]   APTT     Status: None   Collection Time: 12/16/18  1:04 PM  Result Value Ref Range   aPTT 30 24 - 36 seconds    Comment: Performed at [facility], [place] [address], [place], [place] [address]  Type and screen     Status: None (Preliminary result)   Collection Time: 12/16/18  1:04 PM  Result Value Ref Range   ABO/RH(D) O POS    Antibody Screen NEG    Sample Expiration 12/19/2018,2359    Unit Number [id number]    Blood Component Type RED CELLS,LR    Unit division 00    Status of Unit ALLOCATED    Transfusion Status OK TO TRANSFUSE    Crossmatch Result      Compatible Performed at [facility] [address] [place], [place] [address]    Unit Number [id number]    Blood Component Type RED CELLS,LR    Unit division 00    Status of Unit ALLOCATED    Transfusion Status OK TO TRANSFUSE    Crossmatch Result Compatible   ABO/Rh     Status: None (Preliminary result)   Collection Time: 12/16/18  1:04 PM  Result Value Ref Range   ABO/RH(D)      Jenetta Downer POS Performed at [facility], [place] [address] [place], [place] [address]   POC occult blood, ED RN will collect     Status: Abnormal   Collection Time: 12/16/18  2:12 PM  Result Value Ref Range   Fecal Occult Bld POSITIVE (A) NEGATIVE  Prepare RBC  Status: None   Collection Time: 12/16/18  2:30 PM  Result Value Ref Range   Order Confirmation      ORDER PROCESSED BY BLOOD BANK Performed at [place] [address], [place], [place] [address]    Review of Systems  Constitutional: Positive for malaise/fatigue. Negative for chills, fever and weight loss.  HENT: Negative.   Eyes: Negative.   Respiratory: Negative.   Cardiovascular: Negative.   Gastrointestinal: Positive for abdominal pain, blood in stool and melena. Negative for nausea and vomiting.  Musculoskeletal: Positive for back pain and joint pain.  Skin: Negative.   Neurological: Negative.   Psychiatric/Behavioral: Positive for depression. The patient is nervous/anxious.    Blood pressure 109/89, pulse 85, temperature 97.9 F (36.6 C), temperature source Oral, resp. rate 17, SpO2 99 %. Physical Exam  Constitutional: She appears well-developed and well-nourished.  Patient seen somewhat confused and disoriented on exam  HENT:  Head: Normocephalic and atraumatic.  Eyes: Pupils are equal, round, and reactive to light. Conjunctivae and EOM are normal.  Neck: Normal range of motion. Neck supple.  Cardiovascular: Normal rate and regular rhythm.  Respiratory: Effort normal and breath sounds normal.  GI: Soft. Bowel sounds are normal. She exhibits no distension and no mass. There is no abdominal tenderness. There is no rebound and no guarding.  Skin: Skin is warm and dry.   Assessment/Plan: 1) Severe anemia with rectal bleeding-has had an extensive GI work-up with no definite source of bleeding being identified except for a small jejunal AVM on one occasion and a few small gastric ulcers in the past.  She has been aggressively resuscitated tonight.  She will need repeat work-up with a EGD and possibly a small bowel capsule study versus spirus enteroscopy at a tertiary care center like [facility]. Dr. Covelo Cellar is to see the patient tomorrow morning in follow-up. I will defer further decision making with regards to repeat procedures to Dr. Havery Moros. I will plan to keep the patient n.p.o. after midnight tonight in case repeat  endoscopy is planned for tomorrow. 2) GERD on PPIs 3) Hypertension. 4) Adult onset diabetes mellitus. 5) COPD with ongoing tobacco abuse. 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 7) Recent hepatic encephalopathy treated with Xifaxan and lactulose.  There is a mention of cirrhosis on one of the procedure reports which may be due to passive congestion and heart failure. Juanita Craver 12/16/2018, 4:13 PM

## 2018-12-16 NOTE — ED Notes (Signed)
Unable to establish second line for Blood transfusion, IV team consulted.

## 2018-12-16 NOTE — ED Notes (Signed)
Pt not displaying any signs of a reaction to blood at this time.

## 2018-12-16 NOTE — ED Provider Notes (Signed)
[facility] DEPT Provider Note   CSN: [id number] Arrival date & time: 12/16/18  1132     History   Chief Complaint Chief Complaint  Patient presents with  . Poss Blood in Stools    HPI [name] is a 63 y.o. female.     63 year old female with history of rectal bleeding as well as encephalopathy presents with blood in stools x1 day.  Some increased confusion as well 2.  Denies any fever or abdominal discomfort.  No hematemesis.  Patient reportedly had negative Hemoccult at her facility.  She is also been prescribed lactulose.  States compliance with this.  Nothing makes her symptoms better or worse no treatment use prior to arrival     Past Medical History:  Diagnosis Date  . Cervical cancer ([place])   . CHF (congestive heart failure) ([place])   . Chronic anemia   . COPD (chronic obstructive pulmonary disease) ([place])   . Diabetes mellitus without complication ([place])   . Gastric ulcer   . NSTEMI (non-ST elevated myocardial infarction) ([place])   . Upper GI bleed     Patient Active Problem List   Diagnosis Date Noted  . Epistaxis 11/29/2018  . Altered mental status   . Heme positive stool   . Gastritis and gastroduodenitis   . Encephalopathy acute   . Acute on chronic blood loss anemia 11/20/2018  . Pressure injury of skin 10/26/2018  . Anxiety 08/15/2018  . Chronic back pain 08/15/2018  . GERD (gastroesophageal reflux disease) 08/15/2018  . History of carpal tunnel syndrome 08/15/2018  . HNP (herniated nucleus pulposus), lumbar 08/15/2018  . Hyperlipidemia 08/15/2018  . Hypertension 08/15/2018  . Insomnia 08/15/2018  . Left ventricular hypertrophy 08/15/2018  . Obesity 08/15/2018  . Sleep apnea 08/15/2018  . Severe anemia 06/20/2018  . Diabetic polyneuropathy associated with type 2 diabetes mellitus ([place]) 02/23/2018  . Moderate tricuspid regurgitation 02/18/2018  . Acute on chronic diastolic CHF (congestive heart failure) ([place])  02/08/2018  . 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 Depression 02/05/2018  . Generalized abdominal pain 02/05/2018  . CHF (congestive heart failure) ([place]) 01/13/2018  . B12 deficiency 12/07/2017  . Iron deficiency anemia due to chronic blood loss 12/07/2017  . Folate deficiency 12/07/2017  . GIB (gastrointestinal bleeding) 11/03/2017  . COPD (chronic obstructive pulmonary disease) ([place]) 09/26/2017  . Symptomatic anemia 09/26/2017  . Acute on chronic systolic CHF (congestive heart failure) ([place]) 08/27/2017  . Anemia   . Weakness   . GI bleed 08/07/2017  . Chronic systolic heart failure ([place]) 12/28/2016  . Diabetes ([place]) 12/28/2016  . Tobacco use 12/28/2016  . NSTEMI (non-ST elevated myocardial infarction) ([place])   . Sepsis ([place]) 12/18/2016  . CHF exacerbation ([place]) 12/04/2015  . PNA (pneumonia) 10/28/2015  . COPD exacerbation ([place]) 08/29/2015  . Chronic hepatitis C without hepatic coma ([place]) 05/21/2014  . CAD (coronary artery disease) 07/19/2013  . Right arm pain 06/26/2013  . Cervical pain (neck) 03/28/2013  . Radiculopathy affecting upper extremity 03/28/2013  . Impaired renal function 12/21/2012  . Abnormal mammogram 12/20/2012  . Diabetes mellitus type 2, uncontrolled ([place]) 12/20/2012    Past Surgical History:  Procedure Laterality Date  . AMPUTATION Left 06/24/2018   Procedure: AMPUTATION GREAT TOE;  Surgeon: Samara Deist, DPM;  Location: [facility];  Service: Podiatry;  Laterality: Left;  . APPENDECTOMY    . BIOPSY  11/22/2018   Procedure: BIOPSY;  Surgeon: Thornton Park, MD;  Location: [facility];  Service: Gastroenterology;;  .  CARDIAC CATHETERIZATION    . CHOLECYSTECTOMY    . COLONOSCOPY N/A 09/28/2017   Procedure: COLONOSCOPY;  Surgeon: Toledo, Benay Pike, MD;  Location: [facility] ENDOSCOPY;  Service: Gastroenterology;  Laterality: N/A;  . COLONOSCOPY WITH PROPOFOL N/A 08/21/2018   Procedure: COLONOSCOPY WITH PROPOFOL;  Surgeon: Lin Landsman, MD;   Location: [facility] ENDOSCOPY;  Service: Gastroenterology;  Laterality: N/A;  . ENTEROSCOPY Left 11/06/2017   Procedure: ENTEROSCOPY;  Surgeon: Jonathon Bellows, MD;  Location: [facility] ENDOSCOPY;  Service: Gastroenterology;  Laterality: Left;  . ENTEROSCOPY N/A 06/22/2018   Procedure: ENTEROSCOPY;  Surgeon: Lin Landsman, MD;  Location: [facility] ENDOSCOPY;  Service: Gastroenterology;  Laterality: N/A;  . ENTEROSCOPY N/A 08/18/2018   Procedure: ENTEROSCOPY;  Surgeon: Jonathon Bellows, MD;  Location: [facility] ENDOSCOPY;  Service: Gastroenterology;  Laterality: N/A;  . ENTEROSCOPY N/A 11/01/2018   Procedure: ENTEROSCOPY;  Surgeon: Virgel Manifold, MD;  Location: [facility] ENDOSCOPY;  Service: Endoscopy;  Laterality: N/A;  . ENTEROSCOPY N/A 11/22/2018   Procedure: ENTEROSCOPY;  Surgeon: Thornton Park, MD;  Location: [facility];  Service: Gastroenterology;  Laterality: N/A;  . ESOPHAGOGASTRODUODENOSCOPY N/A 12/19/2016   Procedure: ESOPHAGOGASTRODUODENOSCOPY (EGD);  Surgeon: Lin Landsman, MD;  Location: [facility] ENDOSCOPY;  Service: Gastroenterology;  Laterality: N/A;  . ESOPHAGOGASTRODUODENOSCOPY N/A 09/28/2017   Procedure: ESOPHAGOGASTRODUODENOSCOPY (EGD);  Surgeon: Toledo, Benay Pike, MD;  Location: [facility] ENDOSCOPY;  Service: Gastroenterology;  Laterality: N/A;  . ESOPHAGOGASTRODUODENOSCOPY (EGD) WITH PROPOFOL N/A 08/08/2017   Procedure: ESOPHAGOGASTRODUODENOSCOPY (EGD) WITH PROPOFOL;  Surgeon: [name], MD;  Location: [facility] ENDOSCOPY;  Service: Endoscopy;  Laterality: N/A;  . GIVENS CAPSULE STUDY N/A 10/28/2018   Procedure: GIVENS CAPSULE STUDY;  Surgeon: Jonathon Bellows, MD;  Location: [facility] ENDOSCOPY;  Service: Gastroenterology;  Laterality: N/A;  . [place]    . LEFT HEART CATH AND CORONARY ANGIOGRAPHY N/A 12/23/2016   Procedure: LEFT HEART CATH AND CORONARY ANGIOGRAPHY;  Surgeon: Teodoro Spray, MD;  Location: [facility] CV LAB;  Service: Cardiovascular;  Laterality: N/A;  . LOWER EXTREMITY  ANGIOGRAPHY Left 06/26/2018   Procedure: Lower Extremity Angiography;  Surgeon: Katha Cabal, MD;  Location: [facility] CV LAB;  Service: Cardiovascular;  Laterality: Left;  . TEE WITHOUT CARDIOVERSION N/A 10/29/2018   Procedure: TRANSESOPHAGEAL ECHOCARDIOGRAM (TEE);  Surgeon: Teodoro Spray, MD;  Location: [facility] ORS;  Service: Cardiovascular;  Laterality: N/A;     OB History   No obstetric history on file.      Home Medications    Prior to Admission medications   Medication Sig Start Date End Date Taking? Authorizing Provider  Amino Acids-Protein Hydrolys (FEEDING SUPPLEMENT, PRO-STAT SUGAR FREE 64,) LIQD Take 30 mLs by mouth 2 (two) times daily. 12/07/18   Al Decant, MD  atorvastatin (LIPITOR) 40 MG tablet Take 40 mg by mouth daily.    [provider]  carvedilol (COREG) 3.125 MG tablet Take 1 tablet (3.125 mg total) by mouth 2 (two) times daily with a meal. 08/21/18   Ojie, Jude, MD  feeding supplement, ENSURE ENLIVE, (ENSURE ENLIVE) LIQD Take 237 mLs by mouth 2 (two) times daily between meals. 12/07/18   Al Decant, MD  fluticasone [facility]) 50 MCG/ACT nasal spray Place 2 sprays into both nostrils daily.    [provider]  folic acid (FOLVITE) 1 MG tablet Take 1 tablet (1 mg total) by mouth daily. 08/21/18   Stark Jock Jude, MD  lactulose (CHRONULAC) 10 GM/15ML solution Take 30 mLs (20 g total) by mouth 3 (three) times daily. 12/07/18  Al Decant, MD  mirtazapine (REMERON SOL-TAB) 30 MG disintegrating tablet Take 1 tablet (30 mg total) by mouth at bedtime. 12/07/18   Al Decant, MD  Multiple Vitamin (MULTIVITAMIN WITH MINERALS) TABS tablet Take 1 tablet by mouth daily. 06/28/18   Lang Snow, NP  pantoprazole (PROTONIX) 40 MG tablet Take 1 tablet (40 mg total) by mouth daily. 12/07/18   Al Decant, MD  spironolactone (ALDACTONE) 25 MG tablet Take 12.5 mg by mouth daily.    [provider]  torsemide (DEMADEX) 20 MG tablet Take  20 mg by mouth daily. Take 2 tablets if weight is greater than 185 lbs. NO tablets if weight is less than 177 lbs.    [provider]    Family History Family History  Problem Relation Age of Onset  . Hypertension Mother   . Diabetes Mellitus II Mother   . Breast cancer Mother   . Heart disease Father   . Cancer Sister     Social History Social History   Tobacco Use  . Smoking status: Current Some Day Smoker    Packs/day: 0.25    Types: Cigarettes  . Smokeless tobacco: Never Used  Substance Use Topics  . Alcohol use: No  . Drug use: No     Allergies   Latex and Gabapentin   Review of Systems Review of Systems  All other systems reviewed and are negative.    Physical Exam Updated Vital Signs BP 109/89 (BP Location: Right Arm)   Pulse 85   Temp 97.9 F (36.6 C) (Oral)   Resp 17   SpO2 99%   Physical Exam Vitals signs and nursing note reviewed.  Constitutional:      General: She is not in acute distress.    Appearance: Normal appearance. She is well-developed. She is not toxic-appearing.  HENT:     Head: Normocephalic and atraumatic.  Eyes:     General: Lids are normal.     Conjunctiva/sclera: Conjunctivae normal.     Pupils: Pupils are equal, round, and reactive to light.  Neck:     Musculoskeletal: Normal range of motion and neck supple.     Thyroid: No thyroid mass.     Trachea: No tracheal deviation.  Cardiovascular:     Rate and Rhythm: Normal rate and regular rhythm.     Heart sounds: Normal heart sounds. No murmur. No gallop.   Pulmonary:     Effort: Pulmonary effort is normal. No respiratory distress.     Breath sounds: Normal breath sounds. No stridor. No decreased breath sounds, wheezing, rhonchi or rales.  Abdominal:     General: Bowel sounds are normal. There is no distension.     Palpations: Abdomen is soft.     Tenderness: There is no abdominal tenderness. There is no rebound.  Genitourinary:    Comments: Dark stool noted  Musculoskeletal: Normal range of motion.        General: No tenderness.  Skin:    General: Skin is warm and dry.     Findings: No abrasion or rash.  Neurological:     Mental Status: She is alert and oriented to person, place, and time.     GCS: GCS eye subscore is 4. GCS verbal subscore is 5. GCS motor subscore is 6.     Cranial Nerves: No cranial nerve deficit.     Sensory: No sensory deficit.  Psychiatric:        Attention and Perception: Attention normal.  Mood and Affect: Affect is flat.        Speech: Speech is delayed.        Behavior: Behavior is cooperative.      ED Treatments / Results  Labs (all labs ordered are listed, but only abnormal results are displayed) Labs Reviewed  CBC WITH DIFFERENTIAL/PLATELET  COMPREHENSIVE METABOLIC PANEL  AMMONIA  PROTIME-INR  APTT  POC OCCULT BLOOD, ED  TYPE AND SCREEN    EKG None  Radiology No results found.  Procedures Procedures (including critical care time)  Medications Ordered in ED Medications  0.9 %  sodium chloride infusion (has no administration in time range)     Initial Impression / Assessment and Plan / ED Course  I have reviewed the triage vital signs and the nursing notes.  Pertinent labs & imaging results that were available during my care of the patient were reviewed by me and considered in my medical decision making (see chart for details).        Patient will with evidence of GI bleed here and has hemoglobin of 4.8.  Also has evidence of acute kidney injury.  2 units of packed red blood cells ordered.  Will consult of our GI.  Hemodynamically stable at this time.  Ammonia level within normal limits.  Discussed with Dr. Grandville Silos from tried hospitalist who admit the patient  CRITICAL CARE Performed by: Leota Jacobsen Total critical care time: 50 minutes Critical care time was exclusive of separately billable procedures and treating other patients. Critical care was necessary to treat or  prevent imminent or life-threatening deterioration. Critical care was time spent personally by me on the following activities: development of treatment plan with patient and/or surrogate as well as nursing, discussions with consultants, evaluation of patient's response to treatment, examination of patient, obtaining history from patient or surrogate, ordering and performing treatments and interventions, ordering and review of laboratory studies, ordering and review of radiographic studies, pulse oximetry and re-evaluation of patient's condition.   Final Clinical Impressions(s) / ED Diagnoses   Final diagnoses:  None    ED Discharge Orders    None       Lacretia Leigh, MD 12/16/18 1438

## 2018-12-17 ENCOUNTER — Encounter (HOSPITAL_COMMUNITY)
Admission: EM | Disposition: A | Discharge status: Skilled Nursing Facility | Payer: Self-pay | Source: Skilled Nursing Facility | Attending: Internal Medicine

## 2018-12-17 ENCOUNTER — Encounter (HOSPITAL_COMMUNITY): Payer: Self-pay | Admitting: *Deleted

## 2018-12-17 ENCOUNTER — Inpatient Hospital Stay (HOSPITAL_COMMUNITY): Payer: Medicaid Other | Admitting: Anesthesiology

## 2018-12-17 DIAGNOSIS — K5521 Angiodysplasia of colon with hemorrhage: Secondary | ICD-10-CM

## 2018-12-17 DIAGNOSIS — K922 Gastrointestinal hemorrhage, unspecified: Secondary | ICD-10-CM

## 2018-12-17 DIAGNOSIS — D500 Iron deficiency anemia secondary to blood loss (chronic): Secondary | ICD-10-CM

## 2018-12-17 HISTORY — PX: SUBMUCOSAL TATTOO INJECTION: SHX6856

## 2018-12-17 HISTORY — PX: HOT HEMOSTASIS: SHX5433

## 2018-12-17 HISTORY — PX: ENTEROSCOPY: SHX5533

## 2018-12-17 LAB — COMPREHENSIVE METABOLIC PANEL
ALT: 13 U/L (ref 0–44)
AST: 22 U/L (ref 15–41)
Albumin: 2.8 g/dL — ABNORMAL LOW (ref 3.5–5.0)
Alkaline Phosphatase: 87 U/L (ref 38–126)
Anion gap: 10 (ref 5–15)
BUN: 62 mg/dL — ABNORMAL HIGH (ref 8–23)
CO2: 24 mmol/L (ref 22–32)
Calcium: 8.3 mg/dL — ABNORMAL LOW (ref 8.9–10.3)
Chloride: 104 mmol/L (ref 98–111)
Creatinine, Ser: 1.97 mg/dL — ABNORMAL HIGH (ref 0.44–1.00)
GFR calc Af Amer: 30 mL/min — ABNORMAL LOW (ref >60)
GFR calc non Af Amer: 26 mL/min — ABNORMAL LOW (ref >60)
Glucose, Bld: 142 mg/dL — ABNORMAL HIGH (ref 70–99)
Potassium: 4.5 mmol/L (ref 3.5–5.1)
Sodium: 138 mmol/L (ref 135–145)
Total Bilirubin: 1.2 mg/dL (ref 0.3–1.2)
Total Protein: 7.2 g/dL (ref 6.5–8.1)

## 2018-12-17 LAB — RETICULOCYTES
Immature Retic Fract: 27.1 % — ABNORMAL HIGH (ref 2.3–15.9)
RBC.: 2.48 MIL/uL — ABNORMAL LOW (ref 3.87–5.11)
Retic Count, Absolute: 76.1 10*3/uL (ref 19.0–186.0)
Retic Ct Pct: 3.1 % (ref 0.4–3.1)

## 2018-12-17 LAB — VITAMIN B12: Vitamin B-12: 1051 pg/mL — ABNORMAL HIGH (ref 180–914)

## 2018-12-17 LAB — CBC
HCT: 22 % — ABNORMAL LOW (ref 36.0–46.0)
Hemoglobin: 6.6 g/dL — CL (ref 12.0–15.0)
MCH: 27 pg (ref 26.0–34.0)
MCHC: 30 g/dL (ref 30.0–36.0)
MCV: 90.2 fL (ref 80.0–100.0)
Platelets: 133 10*3/uL — ABNORMAL LOW (ref 150–400)
RBC: 2.44 MIL/uL — ABNORMAL LOW (ref 3.87–5.11)
RDW: 17.2 % — ABNORMAL HIGH (ref 11.5–15.5)
WBC: 5.9 10*3/uL (ref 4.0–10.5)
nRBC: 0 % (ref 0.0–0.2)

## 2018-12-17 LAB — GLUCOSE, CAPILLARY
Glucose-Capillary: 130 mg/dL — ABNORMAL HIGH (ref 70–99)
Glucose-Capillary: 109 mg/dL — ABNORMAL HIGH (ref 70–99)
Glucose-Capillary: 108 mg/dL — ABNORMAL HIGH (ref 70–99)
Glucose-Capillary: 107 mg/dL — ABNORMAL HIGH (ref 70–99)
Glucose-Capillary: 161 mg/dL — ABNORMAL HIGH (ref 70–99)

## 2018-12-17 LAB — IRON AND TIBC
Iron: 42 ug/dL (ref 28–170)
Saturation Ratios: 14 % (ref 10.4–31.8)
TIBC: 294 ug/dL (ref 250–450)
UIBC: 252 ug/dL

## 2018-12-17 LAB — PREPARE RBC (CROSSMATCH)

## 2018-12-17 LAB — HEMOGLOBIN AND HEMATOCRIT, BLOOD
HCT: 31.5 % — ABNORMAL LOW (ref 36.0–46.0)
Hemoglobin: 10 g/dL — ABNORMAL LOW (ref 12.0–15.0)

## 2018-12-17 LAB — HEMOGLOBIN A1C
Hgb A1c MFr Bld: 5.4 % (ref 4.8–5.6)
Mean Plasma Glucose: 108.28 mg/dL

## 2018-12-17 LAB — FOLATE: Folate: 34.3 ng/mL (ref >5.9)

## 2018-12-17 LAB — FERRITIN: Ferritin: 60 ng/mL (ref 11–307)

## 2018-12-17 LAB — SARS CORONAVIRUS 2 (TAT 6-24 HRS): SARS Coronavirus 2: NEGATIVE

## 2018-12-17 LAB — ABO/RH: ABO/RH(D): O POS

## 2018-12-17 SURGERY — ENTEROSCOPY
Anesthesia: Monitor Anesthesia Care

## 2018-12-17 MED ORDER — PHENYLEPHRINE 40 MCG/ML (10ML) SYRINGE FOR IV PUSH (FOR BLOOD PRESSURE SUPPORT)
PREFILLED_SYRINGE | INTRAVENOUS | Status: DC | PRN
  Administered 2018-12-17 (×3): 80 ug via INTRAVENOUS

## 2018-12-17 MED ORDER — LACTATED RINGERS IV SOLN
INTRAVENOUS | Status: DC | PRN
  Administered 2018-12-17: 15:00:00 via INTRAVENOUS

## 2018-12-17 MED ORDER — SPOT INK MARKER SYRINGE KIT
PACK | SUBMUCOSAL | Status: DC | PRN
  Administered 2018-12-17: 2 mL via SUBMUCOSAL

## 2018-12-17 MED ORDER — SODIUM CHLORIDE 0.9% IV SOLUTION
Freq: Once | INTRAVENOUS | Status: AC
  Administered 2018-12-17: 03:00:00 via INTRAVENOUS

## 2018-12-17 MED ORDER — PROPOFOL 500 MG/50ML IV EMUL
INTRAVENOUS | Status: DC | PRN
  Administered 2018-12-17: 125 ug/kg/min via INTRAVENOUS

## 2018-12-17 MED ORDER — PROPOFOL 10 MG/ML IV BOLUS
INTRAVENOUS | Status: DC | PRN
  Administered 2018-12-17: 30 mg via INTRAVENOUS
  Administered 2018-12-17: 20 mg via INTRAVENOUS

## 2018-12-17 MED ORDER — LIDOCAINE 2% (20 MG/ML) 5 ML SYRINGE
INTRAMUSCULAR | Status: DC | PRN
  Administered 2018-12-17: 75 mg via INTRAVENOUS

## 2018-12-17 NOTE — Progress Notes (Addendum)
PROGRESS NOTE    [name]  ZOX:[id number] DOB: Sep 20, 1955 DOA: 12/16/2018 PCP: [facility]   Brief Narrative:  Patient is a 50 female with a past medical history of COPD with ongoing tobacco abuse, history of gastric ulcer, AVMs, diabetes mellitus, chronic back pain, CHF, iron deficiency anemia due to chronic blood loss, chronic systolic heart failure, history of coronary artery disease, chronic hepatitis C who presents from nursing facility for the evaluation of 2-day history of maroon-colored bloody bowel movements.  She was recently hospitalized here from 11/3-11/20 for GI bleed.  At that time she underwent extensive GI evaluation.  When she presented here this time, her hemoglobin was 4.8.  GI consulted.  She has been transfused with PRBCs.  Total of 4 units.  Hemoglobin this morning is still in the range of 6.  GI planning for enteroscopy today.  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  She presented with maroon-colored stools for 2 days from skilled nursing facility.  FOBT positive.  Hemoglobin was 4.8 on admission.  Recently hospitalized this month and underwent GI evaluation with finding of AVM as noted on capsule endoscopy.  She underwent studies with colonoscopy, enteroscopy, capsule study, tagged RBC scan. underwent transfusion with 5 unit of PRBC on last admission.  When she  was discharged home her hemoglobin was in the range of 10.  She also has history of gastritis/gastroenteritis. On this admission, she has been transfused with 4 units of PRBC.  We will continue to monitor H&H.  GI following and plan for enteroscopy  today.  Currently on Protonix drip.  Chronic systolic heart failure/restrictive  cardiomyopathy/coronary artery disease: Currently stable.  Denies any chest pain or shortness of breath.  Has trace edema on bilateral lower extremities.  Continue patient's home regimen of Coreg, Aldactone, Demadex, Lipitor.  Monitor input/output  CKD stage 3: Creatinine fluctuates from 1.5-2.5.  Currently at baseline.  COPD with ongoing tobacco abuse: Denied nicotine patch.  On Dulera, Spiriva.  Continue bronchodilators as needed.  History of hepatic encephalopathy: Noted on prior hospitalization.  History of hepatitis C/cirrhosis.  On lactulose.  Ammonia level is normal.  GERD: Continue PPI  Hypertension: Currently blood pressure stable.  Continue Coreg, spironolactone, Demadex  Diabetes type 2: Well controlled.  Hemoglobin A1c of 5.9 on 06/21/2018.  Continue sliding-scale insulin  Folic acid deficiency: Continue folate.  Debility/deconditioning/confusion at baseline:  Nursing home resident.  She is alert, awake but oriented to place and person only.  She states she ambulates with the help of walker.  Confused on baseline.  History of hepatic encephalopathy but ammonia is normal.  Continue supportive care.  Pressure Injury 12/16/18 Sacrum Mid Stage II -  Partial thickness loss of dermis presenting as a shallow open ulcer with a red, pink wound bed without slough. (Active)  12/16/18 1749  Location: Sacrum  Location Orientation: Mid  Staging: Stage II -  Partial thickness loss of dermis presenting as a shallow open ulcer with a red, pink wound bed without slough.  Wound Description (Comments):   Present on Admission: Yes              DVT prophylaxis:SCD  Code Status: Full Family Communication: None present at the bedside Disposition Plan: Back to skilled nursing facility after full work-up, clinical stability   Consultants: GI  Procedures: None  Antimicrobials:  Anti-infectives (From admission, onward)   None      Subjective: Patient seen and examined the bedside this morning.  Hemodynamically stable.  She was being transfused with the last unit of PRBC.  Denies any nausea, vomiting, abdominal pain.  No active bleeding at present.  She looked comfortable, lying on bed.  Alert and awake but not oriented to time.  Objective: Vitals:   12/17/18 0553 12/17/18 0610 12/17/18 0612 12/17/18 0800  BP: (!) 120/54 (!) 113/57 (!) 113/57   Pulse: 77 76 76   Resp: 13 16 16    Temp: 98.2 F (36.8 C) 98.2 F (36.8 C) 98.2 F (36.8 C) 97.9 F (36.6 C)  TempSrc: Axillary  Axillary Oral  SpO2: (!) 88%  94%   Weight:      Height:        Intake/Output Summary (Last 24 hours) at 12/17/2018 0826 Last data filed at 12/17/2018 0530 Gross per 24 hour  Intake 2490.34 ml  Output 1300 ml  Net 1190.34 ml   Filed Weights   12/16/18 1745 12/17/18 0243  Weight: 92.4 kg 95.4 kg    Examination:  General exam: Not in distress, deconditioned, debilitated HEENT:PERRL, Ear/Nose normal on gross exam Respiratory system: Bilateral equal air entry, normal vesicular breath sounds, no wheezes or crackles  Cardiovascular system: S1 & S2 heard, RRR. No JVD, murmurs, rubs, gallops or clicks. Trace pedal edema. Gastrointestinal system: Abdomen is nondistended, soft and nontender. No organomegaly or masses felt. Normal bowel sounds heard. Central nervous system: Alert and awake.oriented x2  Extremities:Tarce pedal  edema, no clubbing ,no cyanosis, amputation of left great toe  skin: No rashes, lesions ,no icterus ,no pallor,stage 2 sacral ulcer Psychiatry: Judgement and insight appear impaired   Data Reviewed: I have personally reviewed following labs and  imaging studies  CBC: Recent Labs  Lab 12/16/18 1304 12/17/18 0039  WBC 6.0 5.9  NEUTROABS 3.8  --   HGB 4.8* 6.6*  HCT 16.8* 22.0*  MCV 91.8 90.2  PLT 133* 476*   Basic Metabolic Panel: Recent Labs  Lab 12/16/18 1304 12/17/18 0039  NA 136 138  K 4.8 4.5  CL 102 104  CO2 25 24  GLUCOSE 192* 142*  BUN 65* 62*  CREATININE 1.97* 1.97*  CALCIUM 8.2* 8.3*   GFR: Estimated Creatinine Clearance: 33 mL/min (A) (by C-G formula based on SCr of 1.97 mg/dL (H)). Liver Function Tests: Recent Labs  Lab 12/16/18 1304 12/17/18 0039  AST 25 22  ALT 15 13  ALKPHOS 93 87  BILITOT 0.5 1.2  PROT 7.6 7.2  ALBUMIN 2.9* 2.8*   No results for input(s): LIPASE, AMYLASE in the last 168 hours. Recent Labs  Lab 12/16/18 1304  AMMONIA 20   Coagulation Profile: Recent Labs  Lab 12/16/18 1304  INR 1.3*   Cardiac Enzymes: No results for input(s): CKTOTAL, CKMB, CKMBINDEX, TROPONINI in the last 168 hours. BNP (last 3 results) No results for input(s): PROBNP in the last 8760 hours. HbA1C: Recent Labs  12/17/18 0039  HGBA1C 5.4   CBG: Recent Labs  Lab 12/16/18 2008 12/16/18 2336 12/17/18 0400 12/17/18 0740  GLUCAP 127* 133* 130* 109*   Lipid Profile: No results for input(s): CHOL, HDL, LDLCALC, TRIG, CHOLHDL, LDLDIRECT in the last 72 hours. Thyroid Function Tests: No results for input(s): TSH, T4TOTAL, FREET4, T3FREE, THYROIDAB in the last 72 hours. Anemia Panel: Recent Labs    12/17/18 0039  VITAMINB12 1,051*  FOLATE 34.3  FERRITIN 60  TIBC 294  IRON 42  RETICCTPCT 3.1   Sepsis Labs: No results for input(s): PROCALCITON, LATICACIDVEN in the last 168 hours.  Recent Results (from the past 240 hour(s))  SARS CORONAVIRUS 2 (TAT 6-24 HRS) Nasopharyngeal Nasopharyngeal Swab     Status: None   Collection Time: 12/16/18  2:15 PM   Specimen: Nasopharyngeal Swab  Result Value Ref Range Status   SARS Coronavirus 2 NEGATIVE NEGATIVE Final    Comment: (NOTE)  SARS-CoV-2 target nucleic acids are NOT DETECTED. The SARS-CoV-2 RNA is generally detectable in upper and lower respiratory specimens during the acute phase of infection. Negative results do not preclude SARS-CoV-2 infection, do not rule out co-infections with other pathogens, and should not be used as the sole basis for treatment or other patient management decisions. Negative results must be combined with clinical observations, patient history, and epidemiological information. The expected result is Negative. Fact Sheet for Patients: SugarRoll.be Fact Sheet for Healthcare Providers: https://www.woods-mathews.com/ This test is not yet approved or cleared by the [place] FDA and  has been authorized for detection and/or diagnosis of SARS-CoV-2 by FDA under an Emergency Use Authorization (EUA). This EUA will remain  in effect (meaning this test can be used) for the duration of the COVID-19 declaration under Section 56 4(b)(1) of the Act, 21 U.S.C. section 360bbb-3(b)(1), unless the authorization is terminated or revoked sooner. Performed at [facility], [place] [address] [place], [place] [address]   MRSA PCR Screening     Status: None   Collection Time: 12/16/18  6:15 PM   Specimen: Nasal Mucosa; Nasopharyngeal  Result Value Ref Range Status   MRSA by PCR NEGATIVE NEGATIVE Final    Comment:        The GeneXpert MRSA Assay (FDA approved for NASAL specimens only), is one component of a comprehensive MRSA colonization surveillance program. It is not intended to diagnose MRSA infection nor to guide or monitor treatment for MRSA infections. Performed at [facility], [place] [address] [place], [place] [address]          Radiology Studies: No results found.      Scheduled Meds: . ammonium lactate  1 application Topical BID  . atorvastatin  40 mg Oral q1800  . carvedilol  3.125 mg Oral BID WC  .  Chlorhexidine Gluconate Cloth  6 each Topical Daily  . feeding supplement (PRO-STAT SUGAR FREE 64)  30 mL Oral BID  . fluticasone  2 spray Each Nare Daily  . folic acid  1 mg Oral Daily  . insulin aspart  0-9 Units Subcutaneous Q4H  . lactulose  20 g Oral TID  . mirtazapine  30 mg Oral QHS  . multivitamin with minerals  1 tablet Oral Daily  . [START ON 12/20/2018] pantoprazole  40 mg Intravenous Q12H  . sodium chloride flush  3 mL Intravenous Q12H  . sodium chloride flush  3 mL Intravenous Q12H  . spironolactone  12.5 mg Oral Daily  . torsemide  20 mg Oral Daily  Continuous Infusions: . sodium chloride 9,999 mL/hr at 12/16/18 1315  . sodium chloride    . sodium chloride    . pantoprozole (PROTONIX) infusion 8 mg/hr (12/17/18 0300)     LOS: 1 day    Time spent: 35 mins.More than 50% of that time was spent in counseling and/or coordination of care.      Shelly Coss, MD [facility] Pager [phone]  If 7PM-7AM, please contact night-coverage [email or web address] Password [email or web address] 12/17/2018, 8:26 AM

## 2018-12-17 NOTE — TOC Initial Note (Signed)
Transition of Care [facility]) - Initial/Assessment Note    Patient Details  Name: [name] MRN: [id number] Date of Birth: 01-29-1955  Transition of Care [facility]) CM/SW Contact:    Nila Nephew, LCSW Phone Number: [phone] 12/17/2018, 9:44 AM  Clinical Narrative:       Completed high readmission risk screening due to score 45%. Pt admitted with GI bleed from [facility] where she is a long term care resident. Recent hospitalization at [facility] as well.  Notified SNF of pt's status. Will follow to assist with disposition, anticipate return to [facility].             Expected Discharge Plan: Skilled Nursing Facility Barriers to Discharge: No Barriers Identified, Other (comment)(medical stability)   Patient Goals and CMS Choice Patient states their goals for this hospitalization and ongoing recovery are:: pt not oriented to goal-set      Expected Discharge Plan and Services Expected Discharge Plan: [place] In-house Referral: Clinical Social Work     Living arrangements for the past 2 months: [place]                     Prior Living Arrangements/Services Living arrangements for the past 2 months: [place] Lives with:: Facility Resident Patient language and need for interpreter reviewed:: Yes Do you feel safe going back to the place where you live?: Yes      Need for Family Participation in Patient Care: Yes (Comment)(husband primary decision maker) Care giver support system in place?: Yes (comment)(SNF resident)   Criminal Activity/Legal Involvement Pertinent to Current Situation/Hospitalization: No - Comment as needed  Activities of Daily Living Home Assistive Devices/Equipment: None ADL Screening (condition at time of admission) Patient's cognitive ability adequate to safely complete daily activities?: No Is the patient deaf or have difficulty hearing?: No Does the patient have difficulty seeing, even when  wearing glasses/contacts?: No Does the patient have difficulty concentrating, remembering, or making decisions?: Yes Patient able to express need for assistance with ADLs?: Yes Does the patient have difficulty dressing or bathing?: Yes Independently performs ADLs?: No Communication: Independent Dressing (OT): Needs assistance Is this a change from baseline?: Pre-admission baseline Grooming: Needs assistance Is this a change from baseline?: Pre-admission baseline Feeding: Independent Bathing: Needs assistance Is this a change from baseline?: Pre-admission baseline Toileting: Needs assistance Is this a change from baseline?: Pre-admission baseline In/Out Bed: Needs assistance Is this a change from baseline?: Pre-admission baseline Walks in Home: Dependent Is this a change from baseline?: Pre-admission baseline Does the patient have difficulty walking or climbing stairs?: Yes Weakness of Legs: Both Weakness of Arms/Hands: Both  Permission Sought/Granted Permission sought to share information with : Family Supports, Customer service manager Permission granted to share information with : (pt not oriented)  Share Information with NAME: [phone] husband Kela Millin  Permission granted to share info w AGENCY: [facility] [phone]         Admission diagnosis:  AMS; Blood in Stool Patient Active Problem List   Diagnosis Date Noted  . Epistaxis 11/29/2018  . Altered mental status   . Heme positive stool   . Gastritis and gastroduodenitis   . Encephalopathy acute   . Acute on chronic blood loss anemia 11/20/2018  . Pressure injury of skin 10/26/2018  . Anxiety 08/15/2018  . Chronic back pain 08/15/2018  . GERD (gastroesophageal reflux disease) 08/15/2018  . History of carpal tunnel syndrome 08/15/2018  . HNP (herniated nucleus pulposus),  lumbar 08/15/2018  . Hyperlipidemia 08/15/2018  . Hypertension 08/15/2018  . Insomnia 08/15/2018  . Left ventricular hypertrophy  08/15/2018  . Obesity 08/15/2018  . Sleep apnea 08/15/2018  . Severe anemia 06/20/2018  . Diabetic polyneuropathy associated with type 2 diabetes mellitus ([place]) 02/23/2018  . Moderate tricuspid regurgitation 02/18/2018  . Acute on chronic diastolic CHF (congestive heart failure) ([place]) 02/08/2018  . 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 Depression 02/05/2018  . Generalized abdominal pain 02/05/2018  . CHF (congestive heart failure) ([place]) 01/13/2018  . B12 deficiency 12/07/2017  . Iron deficiency anemia due to chronic blood loss 12/07/2017  . Folate deficiency 12/07/2017  . Upper GI bleed 11/03/2017  . COPD (chronic obstructive pulmonary disease) ([place]) 09/26/2017  . Symptomatic anemia 09/26/2017  . Acute on chronic systolic CHF (congestive heart failure) ([place]) 08/27/2017  . Anemia   . Weakness   . GI bleed 08/07/2017  . Chronic systolic heart failure ([place]) 12/28/2016  . Diabetes ([place]) 12/28/2016  . Tobacco use 12/28/2016  . NSTEMI (non-ST elevated myocardial infarction) ([place])   . Sepsis ([place]) 12/18/2016  . CHF exacerbation ([place]) 12/04/2015  . PNA (pneumonia) 10/28/2015  . COPD exacerbation ([place]) 08/29/2015  . Chronic hepatitis C without hepatic coma ([place]) 05/21/2014  . CAD (coronary artery disease) 07/19/2013  . Right arm pain 06/26/2013  . Cervical pain (neck) 03/28/2013  . Radiculopathy affecting upper extremity 03/28/2013  . Impaired renal function 12/21/2012  . Abnormal mammogram 12/20/2012  . Diabetes mellitus type 2, uncontrolled ([place]) 12/20/2012   PCP:  [facility]:   [place], [place], [place] - [address] [place] [address] Phone: [phone] Fax: [place], [place] - [address] [place] [address] Phone: [phone] Fax: [phone]  [id number] [address] [place]:  [phone]: [phone]     Readmission Risk Interventions Readmission Risk Prevention Plan 12/17/2018 10/26/2018 08/21/2018  Transportation Screening Complete Complete Complete  PCP or Specialist Appt within 5-7 Days - - -  Home Care Screening - - -  Medication Review (RN CM) - - -  HRI or Home Care Consult - Complete Complete  Social Work Consult for RCalaisPlanning/Counseling - Not Complete -  SW consult not completed comments - not recovery care -  Palliative Care Screening - Not Applicable Not Applicable  Medication Review (RN Care Manager) Referral to Pharmacy Referral to Pharmacy Complete  PCP or Specialist appointment within 3-5 days of discharge Not Complete - -  PCP/Specialist Appt Not Complete comments DC date unknown but pt is established with providers - -  HRI or HHavanaNot Complete Complete -  HDunbaror Home Care Consult Pt Refusal Comments SNF resident - -  SW Recovery Care/Counseling Consult Complete - -  Palliative Care Screening Not Complete Not Applicable -  Comments pending need - -  Skilled Nursing Facility Complete - -  Some recent data might be hidden

## 2018-12-17 NOTE — Anesthesia Postprocedure Evaluation (Signed)
Anesthesia Post Note  Patient: [name]  Procedure(s) Performed: ENTEROSCOPY (Left ) HOT HEMOSTASIS (ARGON PLASMA COAGULATION/BICAP) (N/A ) SUBMUCOSAL TATTOO INJECTION     Patient location during evaluation: PACU Anesthesia Type: MAC Level of consciousness: awake and alert and oriented Pain management: pain level controlled Vital Signs Assessment: post-procedure vital signs reviewed and stable Respiratory status: spontaneous breathing, nonlabored ventilation and respiratory function stable Cardiovascular status: blood pressure returned to baseline Postop Assessment: no apparent nausea or vomiting Anesthetic complications: no    Last Vitals:  Vitals:   12/17/18 1610 12/17/18 1620  BP: (!) 148/90 (!) 152/81  Pulse: 70 76  Resp: 17 14  Temp:    SpO2: 99% 99%    Last Pain:  Vitals:   12/17/18 1620  TempSrc:   PainSc: 0-No pain                 Brennan Bailey

## 2018-12-17 NOTE — H&P (View-Only) (Signed)
      Progress Note   Subjective  Patient had RBC transfusion overnight, Hgb rose from 4s to mid 6s, received another 2 units RBC. She is hemodynamically stable. Nursing states no further bleeding since admission. Patient answers yes and no to questioning but unable to get much more additional history, nursing states husband is POA and makes health care decisions. He was called a few times this AM and not able to reach him.    Objective   Vital signs in last 24 hours: Temp:  [97.5 F (36.4 C)-98.7 F (37.1 C)] 98.2 F (36.8 C) (11/30 0612) Pulse Rate:  [74-92] 76 (11/30 0612) Resp:  [10-25] 16 (11/30 0612) BP: (92-148)/(40-89) 113/57 (11/30 0612) SpO2:  [88 %-100 %] 94 % (11/30 0612) Weight:  [92.4 kg-95.4 kg] 95.4 kg (11/30 0243) Last BM Date: 12/16/18 General:    white female in NAD Heart:  Regular rate and rhythm; no murmurs Lungs: Respirations even and unlabored, lungs CTA bilaterally Abdomen:  Soft, nontender and nondistended.  Extremities:  Without edema. Neurologic:  Alert  Psych:  Cooperative. Normal mood and affect.  Intake/Output from previous day: 11/29 0701 - 11/30 0700 In: 2490.3 [I.V.:1195.3; Blood:1295] Out: 1300 [Urine:1300] Intake/Output this shift: No intake/output data recorded.  Lab Results: Recent Labs    12/16/18 1304 12/17/18 0039  WBC 6.0 5.9  HGB 4.8* 6.6*  HCT 16.8* 22.0*  PLT 133* 133*   BMET Recent Labs    12/16/18 1304 12/17/18 0039  NA 136 138  K 4.8 4.5  CL 102 104  CO2 25 24  GLUCOSE 192* 142*  BUN 65* 62*  CREATININE 1.97* 1.97*  CALCIUM 8.2* 8.3*   LFT Recent Labs    12/17/18 0039  PROT 7.2  ALBUMIN 2.8*  AST 22  ALT 13  ALKPHOS 87  BILITOT 1.2   PT/INR Recent Labs    12/16/18 1304  LABPROT 15.6*  INR 1.3*    Studies/Results: No results found.     Assessment / Plan:   63 y/o female with recurrent overt obscure GI bleeding with iron deficiency. She has had an extensive prior evaluation with  colonoscopy, enteroscopy, ,capsule study, tagged RBC scan as previously outlined. 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 She reportedly was passing maroon colored stools with significant bump in BUN, this seems to be most likely small bowel bleeding. With 2 days of bleeding she dropped her Hgb from 9.5 to 4.8 , she has had significant blood loss but has not had any further bleeding since admission.  Discussed options with the patient. Recommend repeat enteroscopy today, will re-evaluate proximal small bowel, look again for previously noted duodenal AVM. If nothing is noted, will plan on repeating a capsule study. If she has overt rebleeding while she is here would recommend a repeat tagged RBC scan. I have called the husband multiple times this AM and could not get ahold of him to review the plan and obtain consent, will try back again later. Please keep the patient NPO and recheck post transfusion Hgb. Her case is tentatively scheduled for 3 PM this afternoon due to availability in the endoscopy unit. Call me with any questions in the interim.   [place] Cellar, MD [facility]

## 2018-12-17 NOTE — Op Note (Signed)
[facility] Patient Name: [name] Procedure Date: 12/17/2018 MRN: [id number] Attending MD: Carlota Raspberry. Clay Menser , MD Date of Birth: 1955/10/03 CSN: [id number] Age: 63 Admit Type: Inpatient Procedure:                Small bowel enteroscopy Indications:              Obscure gastrointestinal bleeding - history of                            melena / iron deficiency anemia, multiple                            transfusions, has had enteroscopy, colonoscopy,                            capsule endoscopy with suspicion for AVMs althougn                            not localized on recent enteroscopies. Now with                            recurrent bleeding leading to another 4 units PRBC                            yesterday. Providers:                Carlota Raspberry. Havery Moros, MD, Glori Bickers, RN, Lina Sar, Technician, [facility], CRNA Referring MD:              Medicines:                Monitored Anesthesia Care Complications:            No immediate complications. Estimated blood loss:                            Minimal. Estimated Blood Loss:     Estimated blood loss was minimal. Procedure:                Pre-Anesthesia Assessment:                           - Prior to the procedure, a History and Physical                            was performed, and patient medications and                            allergies were reviewed. The patient's tolerance of                            previous anesthesia was also reviewed. The risks                            and benefits of the  procedure and the sedation                            options and risks were discussed with the patient.                            All questions were answered, and informed consent                            was obtained. Prior Anticoagulants: The patient has                            taken no previous anticoagulant or antiplatelet                             agents. ASA Grade Assessment: III - A patient with                            severe systemic disease. After reviewing the risks                            and benefits, the patient was deemed in                            satisfactory condition to undergo the procedure.                           After obtaining informed consent, the endoscope was                            passed under direct vision. Throughout the                            procedure, the patient's blood pressure, pulse, and                            oxygen saturations were monitored continuously. The                            [id number]-[id number] ([id number]) Olympus pediatric colonscope                            was introduced through the mouth and advanced to                            the proximal jejunum. The small bowel enteroscopy                            was accomplished without difficulty. The patient                            tolerated the procedure well. Scope In: Scope Out: Findings:      Esophagogastric landmarks were identified: the Z-line was found at 40  cm, the gastroesophageal junction was found at 40 cm and the upper       extent of the gastric folds was found at 40 cm from the incisors.      The exam of the esophagus was otherwise normal.      Mild erythema was found in the gastric fundus, previously biopsied and       no significant pathology.      The exam of the stomach was otherwise normal.      There was no evidence of significant pathology in the entire examined       duodenum.      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 One       of which had active oozing, noted at a distance with the enteroscope       hubbed to the incisors as far as could distally be reached. Fulguration       to ablate the lesion by argon plasma was successful. Good hemostasis was       achieved with APC. I had intended to also place a hemostasis clip across       the lesion, however due to  tortousity at the site and poor positioning       (ran out of scope), the clip was not able to be placed in the region.       Area just proximal to the lesion was tattooed with an injection of Spot       (carbon black) (could not reach to place it distally) - 2 tattoo marks       placed. More proximally in the jejunum another nonbleeding AVM was noted       and ablated with APC.      Exam of the jejunum was otherwise normal. Impression:               - Esophagogastric landmarks identified.                           - Normal esophagus.                           - Very mild proximal gastritis.                           - Normal stomach otherwise/                           - Normal examined duodenum.                           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 Treated with argon plasma                            coagulation (APC) with good result, tattoo placed                            as above Recommendation:           - Return patient to hospital ward for ongoing  care.                           - Clear liquid diet.                           - Continue present medications.                           - Trend Hgb                           - If the patient has rebleeding, consideration for                            repeat capsule endoscopy versus balloon enteroscopy                           - GI service to follow Procedure Code(s):        --- Professional ---                           [id number], Small intestinal endoscopy, enteroscopy                            beyond second portion of duodenum, not including                            ileum; with control of bleeding (eg, injection,                            bipolar cautery, unipolar cautery, laser, heater                            probe, stapler, plasma coagulator)                           44799, Unlisted procedure, small intestine Diagnosis Code(s):        --- Professional  ---                           K29.70, Gastritis, unspecified, without bleeding                           K31.811, Angiodysplasia of stomach and duodenum                            with bleeding                           K92.2, Gastrointestinal hemorrhage, unspecified CPT copyright 2019 American Medical Association. All rights reserved. The codes documented in this report are preliminary and upon coder review may  be revised to meet current compliance requirements. Remo Lipps P. Ladarien Beeks, MD 12/17/2018 4:10:07 PM This report has been signed electronically. Number of Addenda: 0

## 2018-12-17 NOTE — Progress Notes (Signed)
      Progress Note   Subjective  Patient had RBC transfusion overnight, Hgb rose from 4s to mid 6s, received another 2 units RBC. She is hemodynamically stable. Nursing states no further bleeding since admission. Patient answers yes and no to questioning but unable to get much more additional history, nursing states husband is POA and makes health care decisions. He was called a few times this AM and not able to reach him.    Objective   Vital signs in last 24 hours: Temp:  [97.5 F (36.4 C)-98.7 F (37.1 C)] 98.2 F (36.8 C) (11/30 0612) Pulse Rate:  [74-92] 76 (11/30 0612) Resp:  [10-25] 16 (11/30 0612) BP: (92-148)/(40-89) 113/57 (11/30 0612) SpO2:  [88 %-100 %] 94 % (11/30 0612) Weight:  [92.4 kg-95.4 kg] 95.4 kg (11/30 0243) Last BM Date: 12/16/18 General:    white female in NAD Heart:  Regular rate and rhythm; no murmurs Lungs: Respirations even and unlabored, lungs CTA bilaterally Abdomen:  Soft, nontender and nondistended.  Extremities:  Without edema. Neurologic:  Alert  Psych:  Cooperative. Normal mood and affect.  Intake/Output from previous day: 11/29 0701 - 11/30 0700 In: 2490.3 [I.V.:1195.3; Blood:1295] Out: 1300 [Urine:1300] Intake/Output this shift: No intake/output data recorded.  Lab Results: Recent Labs    12/16/18 1304 12/17/18 0039  WBC 6.0 5.9  HGB 4.8* 6.6*  HCT 16.8* 22.0*  PLT 133* 133*   BMET Recent Labs    12/16/18 1304 12/17/18 0039  NA 136 138  K 4.8 4.5  CL 102 104  CO2 25 24  GLUCOSE 192* 142*  BUN 65* 62*  CREATININE 1.97* 1.97*  CALCIUM 8.2* 8.3*   LFT Recent Labs    12/17/18 0039  PROT 7.2  ALBUMIN 2.8*  AST 22  ALT 13  ALKPHOS 87  BILITOT 1.2   PT/INR Recent Labs    12/16/18 1304  LABPROT 15.6*  INR 1.3*    Studies/Results: No results found.     Assessment / Plan:   63 y/o female with recurrent overt obscure GI bleeding with iron deficiency. She has had an extensive prior evaluation with  colonoscopy, enteroscopy, ,capsule study, tagged RBC scan as previously outlined. 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 She reportedly was passing maroon colored stools with significant bump in BUN, this seems to be most likely small bowel bleeding. With 2 days of bleeding she dropped her Hgb from 9.5 to 4.8 , she has had significant blood loss but has not had any further bleeding since admission.  Discussed options with the patient. Recommend repeat enteroscopy today, will re-evaluate proximal small bowel, look again for previously noted duodenal AVM. If nothing is noted, will plan on repeating a capsule study. If she has overt rebleeding while she is here would recommend a repeat tagged RBC scan. I have called the husband multiple times this AM and could not get ahold of him to review the plan and obtain consent, will try back again later. Please keep the patient NPO and recheck post transfusion Hgb. Her case is tentatively scheduled for 3 PM this afternoon due to availability in the endoscopy unit. Call me with any questions in the interim.   [place] Cellar, MD [facility]

## 2018-12-17 NOTE — Transfer of Care (Signed)
Immediate Anesthesia Transfer of Care Note  Patient: [name]  Procedure(s) Performed: ENTEROSCOPY (Left ) HOT HEMOSTASIS (ARGON PLASMA COAGULATION/BICAP) (N/A ) SCLEROTHERAPY SUBMUCOSAL TATTOO INJECTION  Patient Location: PACU  Anesthesia Type:MAC  Level of Consciousness: awake, alert  and oriented  Airway & Oxygen Therapy: Patient Spontanous Breathing and Patient connected to nasal cannula oxygen  Post-op Assessment: Report given to RN and Post -op Vital signs reviewed and stable  Post vital signs: Reviewed and stable  Last Vitals:  Vitals Value Taken Time  BP 137/73 12/17/18 1607  Temp 35.6 C 12/17/18 1607  Pulse 69 12/17/18 1609  Resp 16 12/17/18 1609  SpO2 98 % 12/17/18 1609  Vitals shown include unvalidated device data.  Last Pain:  Vitals:   12/17/18 1607  TempSrc: Temporal  PainSc: 0-No pain         Complications: No apparent anesthesia complications

## 2018-12-17 NOTE — Interval H&P Note (Signed)
History and Physical Interval Note:  12/17/2018 2:46 PM  [name]  has presented today for surgery, with the diagnosis of gi bleeding.  The various methods of treatment have been discussed with the patient and family. After consideration of risks, benefits and other options for treatment, the patient has consented to  Procedure(s): ENTEROSCOPY (Left) as a surgical intervention.  The patient's history has been reviewed, patient examined, no change in status, stable for surgery.  I have reviewed the patient's chart and labs.  Questions were answered to the patient's satisfaction.     [place]

## 2018-12-17 NOTE — Anesthesia Preprocedure Evaluation (Addendum)
Anesthesia Evaluation  Patient identified by MRN, date of birth, ID band Patient awake    Reviewed: Allergy & Precautions, NPO status , Patient's Chart, lab work & pertinent test results, reviewed documented beta blocker date and time   History of Anesthesia Complications Negative for: history of anesthetic complications  Airway Mallampati: III  TM Distance: >3 FB Neck ROM: Full    Dental  (+) Dental Advisory Given   Pulmonary sleep apnea and Continuous Positive Airway Pressure Ventilation , COPD,  COPD inhaler, Current Smoker and Patient abstained from smoking.,    Pulmonary exam normal        Cardiovascular hypertension, Pt. on medications and Pt. on home beta blockers + CAD, + Past MI and +CHF  Normal cardiovascular exam+ Valvular Problems/Murmurs (MS,MR,+TR)    '20 TEE - EF 50 to 55%. Left ventricular septal wall thickness was mildly increased. Mildly increased left ventricular posterior wall thickness. There is mildly  increased left ventricular hypertrophy. Left atrial size was moderately dilated. Right atrial size was mild-moderately dilated. Moderate MR and MS. Mod-severe TR. Mild AI    Neuro/Psych PSYCHIATRIC DISORDERS Anxiety Depression negative neurological ROS     GI/Hepatic PUD, GERD  Medicated and Controlled,(+) Hepatitis -, C GIB    Endo/Other  diabetes, Type 2 Obesity   Renal/GU negative Renal ROS     Musculoskeletal negative musculoskeletal ROS (+)   Abdominal   Peds  Hematology  (+) anemia ,  Thrombocytopenia (133k)    Anesthesia Other Findings Covid negative 11/29  Reproductive/Obstetrics                          Anesthesia Physical Anesthesia Plan  ASA: IV  Anesthesia Plan: MAC   Post-op Pain Management:    Induction: Intravenous  PONV Risk Score and Plan: 2 and Propofol infusion and Treatment may vary due to age or medical condition  Airway Management  Planned: Nasal Cannula and Natural Airway  Additional Equipment: None  Intra-op Plan:   Post-operative Plan:   Informed Consent: I have reviewed the patients History and Physical, chart, labs and discussed the procedure including the risks, benefits and alternatives for the proposed anesthesia with the patient or authorized representative who has indicated his/her understanding and acceptance.       Plan Discussed with: CRNA and Anesthesiologist  Anesthesia Plan Comments:        Anesthesia Quick Evaluation

## 2018-12-18 ENCOUNTER — Encounter (HOSPITAL_COMMUNITY): Payer: Self-pay | Admitting: Gastroenterology

## 2018-12-18 LAB — BPAM RBC
Blood Product Expiration Date: 202012292359
Blood Product Expiration Date: 202012302359
Blood Product Expiration Date: 202012302359
Blood Product Expiration Date: 202012302359
ISSUE DATE / TIME: 202011291625
ISSUE DATE / TIME: 202011292004
ISSUE DATE / TIME: 202011300222
ISSUE DATE / TIME: 202011300546
Unit Type and Rh: 5100
Unit Type and Rh: 5100
Unit Type and Rh: 5100
Unit Type and Rh: 5100

## 2018-12-18 LAB — BASIC METABOLIC PANEL
Anion gap: 11 (ref 5–15)
BUN: 56 mg/dL — ABNORMAL HIGH (ref 8–23)
CO2: 23 mmol/L (ref 22–32)
Calcium: 8.7 mg/dL — ABNORMAL LOW (ref 8.9–10.3)
Chloride: 104 mmol/L (ref 98–111)
Creatinine, Ser: 2.04 mg/dL — ABNORMAL HIGH (ref 0.44–1.00)
GFR calc Af Amer: 29 mL/min — ABNORMAL LOW (ref >60)
GFR calc non Af Amer: 25 mL/min — ABNORMAL LOW (ref >60)
Glucose, Bld: 108 mg/dL — ABNORMAL HIGH (ref 70–99)
Potassium: 4.6 mmol/L (ref 3.5–5.1)
Sodium: 138 mmol/L (ref 135–145)

## 2018-12-18 LAB — TYPE AND SCREEN
ABO/RH(D): O POS
Antibody Screen: NEGATIVE
Unit division: 0
Unit division: 0
Unit division: 0
Unit division: 0

## 2018-12-18 LAB — GLUCOSE, CAPILLARY
Glucose-Capillary: 107 mg/dL — ABNORMAL HIGH (ref 70–99)
Glucose-Capillary: 116 mg/dL — ABNORMAL HIGH (ref 70–99)
Glucose-Capillary: 122 mg/dL — ABNORMAL HIGH (ref 70–99)
Glucose-Capillary: 165 mg/dL — ABNORMAL HIGH (ref 70–99)
Glucose-Capillary: 186 mg/dL — ABNORMAL HIGH (ref 70–99)
Glucose-Capillary: 191 mg/dL — ABNORMAL HIGH (ref 70–99)

## 2018-12-18 LAB — HEMOGLOBIN AND HEMATOCRIT, BLOOD
HCT: 26.2 % — ABNORMAL LOW (ref 36.0–46.0)
HCT: 29.5 % — ABNORMAL LOW (ref 36.0–46.0)
Hemoglobin: 8.4 g/dL — ABNORMAL LOW (ref 12.0–15.0)
Hemoglobin: 9 g/dL — ABNORMAL LOW (ref 12.0–15.0)

## 2018-12-18 MED ORDER — HYDROGEN PEROXIDE 3 % EX SOLN
CUTANEOUS | Status: AC
  Administered 2018-12-18: 17:00:00
  Filled 2018-12-18: qty 473

## 2018-12-18 NOTE — Progress Notes (Signed)
Pt has had multiple BMs today that have increasingly became more bloody.

## 2018-12-18 NOTE — Progress Notes (Signed)
Progress Note    ASSESSMENT AND PLAN:   53. 63 yo female with hx of recurrent obscure GI bleed / profound IDA. Admitted with maroon stools. Extensive workup in past. 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 Repeat small bowel enteroscopy yesterday remarkable for two angiodysplastic lesions in the jejunum, one with active oozing. Treated with argon.  -if she rebleeds consider repeat capsule study vrs balloon enteroscopy.  -Completed 4 u prbc yesterday morning. Hgb 4.8 >>> 10.0. Down some ovenight to 8.4 - probably equilibrating as she hasn't had any further bleeding.   2. CKD3 -slight increase in Cr overnight 1.97 >> 2.04  3. Possible cirrhosis based on 06/21/18 CT AP wo contrast. 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 No portal hypertensive findings on EGD. She does have thrombocytopenia and mild coagulopathy.  -No evidence for hepatic decompensation, can follow up outpatient.  -Will need viral hep vaccines as outpatient -can checm HCV viral count as outpatient and if +, consider treatment.    SUBJECTIVE    No complaints. No BMs. / no bleeding. Awaiting breakfast.    OBJECTIVE:    SB enteroscopy 12/17/18 Exam of the jejunum was otherwise normal. - Esophagogastric landmarks identified. - Normal esophagus. - Very mild proximal gastritis. - Normal stomach otherwise/ - Normal examined duodenum. 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 Treated with argon plasma coagulation (APC) with good result, tattoo placed as above   Vital signs in last 24 hours: Temp:  [96 F (35.6 C)-98.1 F (36.7 C)] 98.1 F (36.7 C) (12/01 0400) Pulse Rate:  [70-83] 78 (12/01 0800) Resp:  [12-23] 16 (12/01 0800) BP: (92-178)/(34-105) 128/55 (12/01 0800) SpO2:  [90 %-99 %] 90 % (12/01 0800) Weight:  [91.6 kg] 91.6 kg (12/01 0432) Last BM Date: 12/17/18 General:   Alert, female in NAD EENT:  Normal hearing, non icteric sclera    Heart:  Regular rate and rhythm;  No lower extremity edema   Pulm: Normal respiratory effort   Abdomen:  Soft, nondistended, nontender.  Normal bowel sounds.          Neurologic:  Alert and  oriented x4;  grossly normal neurologically. Psych:  Pleasant, cooperative.  Normal mood and affect.   Intake/Output from previous day: 11/30 0701 - 12/01 0700 In: 1737.5 [P.O.:240; I.V.:1105; Blood:392.5] Out: 2800 [Urine:2800] Intake/Output this shift: No intake/output data recorded.  Lab Results: Recent Labs    12/16/18 1304 12/17/18 0039 12/17/18 1645 12/18/18 0644  WBC 6.0 5.9  --   --   HGB 4.8* 6.6* 10.0* 8.4*  HCT 16.8* 22.0* 31.5* 26.2*  PLT 133* 133*  --   --    BMET Recent Labs    12/16/18 1304 12/17/18 0039 12/18/18 0644  NA 136 138 138  K 4.8 4.5 4.6  CL 102 104 104  CO2 25 24 23   GLUCOSE 192* 142* 108*  BUN 65* 62* 56*  CREATININE 1.97* 1.97* 2.04*  CALCIUM 8.2* 8.3* 8.7*   LFT Recent Labs    12/17/18 0039  PROT 7.2  ALBUMIN 2.8*  AST 22  ALT 13  ALKPHOS 87  BILITOT 1.2   PT/INR Recent Labs    12/16/18 1304  LABPROT 15.6*  INR 1.3*   Hepatitis Panel No results for input(s): HEPBSAG, HCVAB, HEPAIGM, HEPBIGM in the last 72 hours.     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

## 2018-12-18 NOTE — H&P (View-Only) (Signed)
Progress Note    ASSESSMENT AND PLAN:   46. 63 yo female with hx of recurrent obscure GI bleed / profound IDA. Admitted with maroon stools. Extensive workup in past. 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 Repeat small bowel enteroscopy yesterday remarkable for two angiodysplastic lesions in the jejunum, one with active oozing. Treated with argon.  -if she rebleeds consider repeat capsule study vrs balloon enteroscopy.  -Completed 4 u prbc yesterday morning. Hgb 4.8 >>> 10.0. Down some ovenight to 8.4 - probably equilibrating as she hasn't had any further bleeding.   2. CKD3 -slight increase in Cr overnight 1.97 >> 2.04  3. Possible cirrhosis based on 06/21/18 CT AP wo contrast. 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 No portal hypertensive findings on EGD. She does have thrombocytopenia and mild coagulopathy.  -No evidence for hepatic decompensation, can follow up outpatient.  -Will need viral hep vaccines as outpatient -can checm HCV viral count as outpatient and if +, consider treatment.    SUBJECTIVE    No complaints. No BMs. / no bleeding. Awaiting breakfast.    OBJECTIVE:    SB enteroscopy 12/17/18 Exam of the jejunum was otherwise normal. - Esophagogastric landmarks identified. - Normal esophagus. - Very mild proximal gastritis. - Normal stomach otherwise/ - Normal examined duodenum. 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 Treated with argon plasma coagulation (APC) with good result, tattoo placed as above   Vital signs in last 24 hours: Temp:  [96 F (35.6 C)-98.1 F (36.7 C)] 98.1 F (36.7 C) (12/01 0400) Pulse Rate:  [70-83] 78 (12/01 0800) Resp:  [12-23] 16 (12/01 0800) BP: (92-178)/(34-105) 128/55 (12/01 0800) SpO2:  [90 %-99 %] 90 % (12/01 0800) Weight:  [91.6 kg] 91.6 kg (12/01 0432) Last BM Date: 12/17/18 General:   Alert, female in NAD EENT:  Normal hearing, non icteric sclera    Heart:  Regular rate and rhythm;  No lower extremity edema   Pulm: Normal respiratory effort   Abdomen:  Soft, nondistended, nontender.  Normal bowel sounds.          Neurologic:  Alert and  oriented x4;  grossly normal neurologically. Psych:  Pleasant, cooperative.  Normal mood and affect.   Intake/Output from previous day: 11/30 0701 - 12/01 0700 In: 1737.5 [P.O.:240; I.V.:1105; Blood:392.5] Out: 2800 [Urine:2800] Intake/Output this shift: No intake/output data recorded.  Lab Results: Recent Labs    12/16/18 1304 12/17/18 0039 12/17/18 1645 12/18/18 0644  WBC 6.0 5.9  --   --   HGB 4.8* 6.6* 10.0* 8.4*  HCT 16.8* 22.0* 31.5* 26.2*  PLT 133* 133*  --   --    BMET Recent Labs    12/16/18 1304 12/17/18 0039 12/18/18 0644  NA 136 138 138  K 4.8 4.5 4.6  CL 102 104 104  CO2 25 24 23   GLUCOSE 192* 142* 108*  BUN 65* 62* 56*  CREATININE 1.97* 1.97* 2.04*  CALCIUM 8.2* 8.3* 8.7*   LFT Recent Labs    12/17/18 0039  PROT 7.2  ALBUMIN 2.8*  AST 22  ALT 13  ALKPHOS 87  BILITOT 1.2   PT/INR Recent Labs    12/16/18 1304  LABPROT 15.6*  INR 1.3*   Hepatitis Panel No results for input(s): HEPBSAG, HCVAB, HEPAIGM, HEPBIGM in the last 72 hours.     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

## 2018-12-18 NOTE — Progress Notes (Signed)
PROGRESS NOTE    [name]  RJJ:[id number] DOB: 08/03/1955 DOA: 12/16/2018 PCP: [facility]   Brief Narrative:  Patient is a 86 female with a past medical history of COPD with ongoing tobacco abuse, history of gastric ulcer, AVMs, diabetes mellitus, chronic back pain, CHF, iron deficiency anemia due to chronic blood loss, chronic systolic heart failure, history of coronary artery disease, chronic hepatitis C who presents from nursing facility for the evaluation of 2-day history of maroon-colored bloody bowel movements.  She was recently hospitalized here from 11/3-11/20 for GI bleed.  At that time she underwent extensive GI evaluation.  When she presented here this time, her hemoglobin was 4.8.  GI consulted.  She has been transfused with PRBCs.  Total of 4 units.  GI consulted and she underwent push enteroscopy with finding of 2 angiodysplastic lesion in jejunum.  APC applied .hemoglobin this morning in the  range of 8.  No active bleeding at present.  Plan for discharge back to skilled nursing facility after GI clearance.  Assessment & Plan:   Principal Problem:   GI bleed Active Problems:   Chronic systolic heart failure (HCC)   Diabetes (HCC)   COPD (chronic obstructive pulmonary disease) (HCC)   Symptomatic anemia   Upper GI bleed   B12 deficiency   Iron deficiency anemia due to chronic blood loss   Folate deficiency   Anxiety   CAD (coronary artery disease)   Chronic back pain   Depression   Diabetes mellitus type 2, uncontrolled (HCC)   GERD (gastroesophageal reflux disease)   Hyperlipidemia   Hypertension   Obesity   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HCC)   Gastritis and gastroduodenitis   AVM (arteriovenous malformation) of small bowel, acquired with hemorrhage   Acute GI bleed/hematochezia: C  She presented with maroon-colored stools for 2 days from skilled nursing facility.  FOBT positive.  Hemoglobin was 4.8 on admission.  Recently hospitalized this month  and underwent GI evaluation with finding of AVM as noted on capsule endoscopy.  She underwent studies with colonoscopy, enteroscopy, capsule study, tagged RBC scan. underwent transfusion with 5 unit of PRBC on last admission.  When she was discharged home her hemoglobin was in the range of 10.  She also has history of gastritis/gastroenteritis. On this admission, she has been transfused with 4 units of PRBC.GI consulted and she underwent push enteroscopy with finding of 2 angiodysplastic lesion in jejunum.  APC applied .hemoglobin this morning in the  range of 8.  No active bleeding at present.  Plan for discharge back to skilled nursing facility after GI clearance. Currently on Protonix drip.  Chronic systolic heart failure/restrictive  cardiomyopathy/coronary artery disease: Currently stable.  Denies any chest pain or shortness of breath.  Has trace edema on bilateral lower extremities.  Continue patient's home regimen of Coreg, Aldactone, Demadex, Lipitor.  Monitor input/output  CKD stage 3:Baseline Creatinine fluctuates from 1.5-2.5.  Currently at baseline.  COPD with ongoing tobacco abuse: Denied nicotine patch.  On Dulera, Spiriva.  Continue bronchodilators as needed.  History of hepatic encephalopathy: Noted on prior hospitalization.  History of hepatitis C/cirrhosis.  On lactulose.  Ammonia level is normal.  GERD: Continue PPI  Hypertension: Currently blood pressure stable.  Continue Coreg, spironolactone, Demadex  Diabetes type 2: Well controlled.  Hemoglobin A1c of 5.9 on 06/21/2018.  Continue sliding-scale insulin  Folic acid deficiency: Continue folate.  Debility/deconditioning/confusion at baseline:  Nursing home resident.  She is alert, awake but oriented to place and person only.  She states she ambulates with the help of walker.  Confused on baseline.  History of hepatic encephalopathy but ammonia is normal.  Continue supportive care.  Pressure Injury 12/16/18 Sacrum Mid Stage II  -  Partial thickness loss of dermis presenting as a shallow open ulcer with a red, pink wound bed without slough. (Active)  12/16/18 1749  Location: Sacrum  Location Orientation: Mid  Staging: Stage II -  Partial thickness loss of dermis presenting as a shallow open ulcer with a red, pink wound bed without slough.  Wound Description (Comments):   Present on Admission: Yes              DVT prophylaxis:SCD Code Status: Full Family Communication: Called spouse on phone,call not received Disposition Plan: Back to skilled nursing facility after GI clearance  Consultants: GI  Procedures: None  Antimicrobials:  Anti-infectives (From admission, onward)   None      Subjective: Patient seen and examined the bedside this morning.  Medically stable.  Comfortable.  No active bleeding at present.  Denies any abdominal pain, nausea or vomiting.  Denies any other complaints.  Oriented to place and person only.  Objective: Vitals:   12/18/18 0600 12/18/18 0742 12/18/18 0800 12/18/18 0900  BP: (!) 121/46  (!) 128/55 (!) 131/57  Pulse: 70  78 71  Resp: 12  16 11   Temp:  (!) 97.4 F (36.3 C)    TempSrc:  Oral    SpO2: 94%  90% 96%  Weight:      Height:        Intake/Output Summary (Last 24 hours) at 12/18/2018 1230 Last data filed at 12/18/2018 1610 Gross per 24 hour  Intake 1127.96 ml  Output 2650 ml  Net -1522.04 ml   Filed Weights   12/16/18 1745 12/17/18 0243 12/18/18 0432  Weight: 92.4 kg 95.4 kg 91.6 kg    Examination:  General exam: Not in distress, deconditioned, debilitated HEENT:PERRL, Ear/Nose normal on gross exam Respiratory system: Bilateral equal air entry, normal vesicular breath sounds, no wheezes or crackles  Cardiovascular system: S1 & S2 heard, RRR. No JVD, murmurs, rubs, gallops or clicks. Trace pedal edema. Gastrointestinal system: Abdomen is nondistended, soft and nontender. No organomegaly or masses felt. Normal bowel sounds heard. Central nervous  system: Alert and awake.oriented x2  Extremities:Tarce pedal  edema, no clubbing ,no cyanosis, amputation of left great toe  skin: No rashes, lesions ,no icterus ,no pallor,stage 2 sacral ulcer Psychiatry: Judgement and insight appear impaired   Data Reviewed: I have personally reviewed following labs and imaging studies  CBC: Recent Labs  Lab 12/16/18 1304 12/17/18 0039 12/17/18 1645 12/18/18 0644  WBC 6.0 5.9  --   --   NEUTROABS 3.8  --   --   --   HGB 4.8* 6.6* 10.0* 8.4*  HCT 16.8* 22.0* 31.5* 26.2*  MCV 91.8 90.2  --   --   PLT 133* 133*  --   --    Basic Metabolic Panel: Recent Labs  Lab 12/16/18 1304 12/17/18 0039 12/18/18 0644  NA 136 138 138  K 4.8 4.5 4.6  CL 102 104 104  CO2 25 24 23   GLUCOSE 192* 142* 108*  BUN 65* 62* 56*  CREATININE 1.97* 1.97* 2.04*  CALCIUM 8.2* 8.3* 8.7*   GFR: Estimated Creatinine Clearance: 31.1 mL/min (A) (by C-G formula based on SCr of 2.04 mg/dL (H)). Liver Function Tests: Recent Labs  Lab 12/16/18 1304 12/17/18 0039  AST 25 22  ALT 15  13  ALKPHOS 93 87  BILITOT 0.5 1.2  PROT 7.6 7.2  ALBUMIN 2.9* 2.8*   No results for input(s): LIPASE, AMYLASE in the last 168 hours. Recent Labs  Lab 12/16/18 1304  AMMONIA 20   Coagulation Profile: Recent Labs  Lab 12/16/18 1304  INR 1.3*   Cardiac Enzymes: No results for input(s): CKTOTAL, CKMB, CKMBINDEX, TROPONINI in the last 168 hours. BNP (last 3 results) No results for input(s): PROBNP in the last 8760 hours. HbA1C: Recent Labs    12/17/18 0039  HGBA1C 5.4   CBG: Recent Labs  Lab 12/17/18 1226 12/17/18 1703 12/17/18 1950 12/18/18 0011 12/18/18 0408  GLUCAP 108* 107* 161* 116* 122*   Lipid Profile: No results for input(s): CHOL, HDL, LDLCALC, TRIG, CHOLHDL, LDLDIRECT in the last 72 hours. Thyroid Function Tests: No results for input(s): TSH, T4TOTAL, FREET4, T3FREE, THYROIDAB in the last 72 hours. Anemia Panel: Recent Labs    12/17/18 0039   VITAMINB12 1,051*  FOLATE 34.3  FERRITIN 60  TIBC 294  IRON 42  RETICCTPCT 3.1   Sepsis Labs: No results for input(s): PROCALCITON, LATICACIDVEN in the last 168 hours.  Recent Results (from the past 240 hour(s))  SARS CORONAVIRUS 2 (TAT 6-24 HRS) Nasopharyngeal Nasopharyngeal Swab     Status: None   Collection Time: 12/16/18  2:15 PM   Specimen: Nasopharyngeal Swab  Result Value Ref Range Status   SARS Coronavirus 2 NEGATIVE NEGATIVE Final    Comment: (NOTE) SARS-CoV-2 target nucleic acids are NOT DETECTED. The SARS-CoV-2 RNA is generally detectable in upper and lower respiratory specimens during the acute phase of infection. Negative results do not preclude SARS-CoV-2 infection, do not rule out co-infections with other pathogens, and should not be used as the sole basis for treatment or other patient management decisions. Negative results must be combined with clinical observations, patient history, and epidemiological information. The expected result is Negative. Fact Sheet for Patients: SugarRoll.be Fact Sheet for Healthcare Providers: https://www.woods-mathews.com/ This test is not yet approved or cleared by the [place] FDA and  has been authorized for detection and/or diagnosis of SARS-CoV-2 by FDA under an Emergency Use Authorization (EUA). This EUA will remain  in effect (meaning this test can be used) for the duration of the COVID-19 declaration under Section 56 4(b)(1) of the Act, 21 U.S.C. section 360bbb-3(b)(1), unless the authorization is terminated or revoked sooner. Performed at [facility], [place] [address] [place], [place] [address]   MRSA PCR Screening     Status: None   Collection Time: 12/16/18  6:15 PM   Specimen: Nasal Mucosa; Nasopharyngeal  Result Value Ref Range Status   MRSA by PCR NEGATIVE NEGATIVE Final    Comment:        The GeneXpert MRSA Assay (FDA approved for NASAL specimens only), is  one component of a comprehensive MRSA colonization surveillance program. It is not intended to diagnose MRSA infection nor to guide or monitor treatment for MRSA infections. Performed at [facility], [place] [address] [place], [place] [address]          Radiology Studies: No results found.      Scheduled Meds: . ammonium lactate  1 application Topical BID  . atorvastatin  40 mg Oral q1800  . carvedilol  3.125 mg Oral BID WC  . Chlorhexidine Gluconate Cloth  6 each Topical Daily  . feeding supplement (PRO-STAT SUGAR FREE 64)  30 mL Oral BID  . fluticasone  2 spray Each Nare  Daily  . folic acid  1 mg Oral Daily  . insulin aspart  0-9 Units Subcutaneous Q4H  . lactulose  20 g Oral TID  . mirtazapine  30 mg Oral QHS  . multivitamin with minerals  1 tablet Oral Daily  . [START ON 12/20/2018] pantoprazole  40 mg Intravenous Q12H  . sodium chloride flush  3 mL Intravenous Q12H  . sodium chloride flush  3 mL Intravenous Q12H  . spironolactone  12.5 mg Oral Daily  . torsemide  20 mg Oral Daily   Continuous Infusions: . sodium chloride 9,999 mL/hr at 12/16/18 1315  . sodium chloride    . sodium chloride    . pantoprozole (PROTONIX) infusion 8 mg/hr (12/18/18 0638)     LOS: 2 days    Time spent: 35 mins.More than 50% of that time was spent in counseling and/or coordination of care.      Shelly Coss, MD [facility] Pager [phone]  If 7PM-7AM, please contact night-coverage [email or web address] Password [email or web address] 12/18/2018, 12:30 PM

## 2018-12-19 ENCOUNTER — Inpatient Hospital Stay (HOSPITAL_COMMUNITY): Payer: Medicaid Other | Admitting: Certified Registered Nurse Anesthetist

## 2018-12-19 ENCOUNTER — Encounter (HOSPITAL_COMMUNITY)
Admission: EM | Disposition: A | Discharge status: Skilled Nursing Facility | Payer: Self-pay | Source: Skilled Nursing Facility | Attending: Internal Medicine

## 2018-12-19 ENCOUNTER — Encounter (HOSPITAL_COMMUNITY): Payer: Self-pay | Admitting: Gastroenterology

## 2018-12-19 HISTORY — PX: SUBMUCOSAL INJECTION: SHX5543

## 2018-12-19 HISTORY — PX: HEMOSTASIS CLIP PLACEMENT: SHX6857

## 2018-12-19 HISTORY — PX: ENTEROSCOPY: SHX5533

## 2018-12-19 LAB — CBC WITH DIFFERENTIAL/PLATELET
Abs Immature Granulocytes: 0.02 10*3/uL (ref 0.00–0.07)
Abs Immature Granulocytes: 0.01 10*3/uL (ref 0.00–0.07)
Abs Immature Granulocytes: 0.04 10*3/uL (ref 0.00–0.07)
Abs Immature Granulocytes: 0.03 10*3/uL (ref 0.00–0.07)
Abs Immature Granulocytes: 0 10*3/uL (ref 0.00–0.07)
Basophils Absolute: 0 10*3/uL (ref 0.0–0.1)
Basophils Absolute: 0 10*3/uL (ref 0.0–0.1)
Basophils Absolute: 0 10*3/uL (ref 0.0–0.1)
Basophils Absolute: 0.1 10*3/uL (ref 0.0–0.1)
Basophils Absolute: 0 10*3/uL (ref 0.0–0.1)
Basophils Relative: 1 %
Basophils Relative: 1 %
Basophils Relative: 0 %
Basophils Relative: 1 %
Basophils Relative: 1 %
Eosinophils Absolute: 0.3 10*3/uL (ref 0.0–0.5)
Eosinophils Absolute: 0.3 10*3/uL (ref 0.0–0.5)
Eosinophils Absolute: 0.3 10*3/uL (ref 0.0–0.5)
Eosinophils Absolute: 0.2 10*3/uL (ref 0.0–0.5)
Eosinophils Absolute: 0.2 10*3/uL (ref 0.0–0.5)
Eosinophils Relative: 5 %
Eosinophils Relative: 5 %
Eosinophils Relative: 4 %
Eosinophils Relative: 4 %
Eosinophils Relative: 5 %
HCT: 25.5 % — ABNORMAL LOW (ref 36.0–46.0)
HCT: 25.8 % — ABNORMAL LOW (ref 36.0–46.0)
HCT: 28.9 % — ABNORMAL LOW (ref 36.0–46.0)
HCT: 28.1 % — ABNORMAL LOW (ref 36.0–46.0)
HCT: 28.5 % — ABNORMAL LOW (ref 36.0–46.0)
Hemoglobin: 7.7 g/dL — ABNORMAL LOW (ref 12.0–15.0)
Hemoglobin: 7.9 g/dL — ABNORMAL LOW (ref 12.0–15.0)
Hemoglobin: 8.6 g/dL — ABNORMAL LOW (ref 12.0–15.0)
Hemoglobin: 8.5 g/dL — ABNORMAL LOW (ref 12.0–15.0)
Hemoglobin: 8.5 g/dL — ABNORMAL LOW (ref 12.0–15.0)
Immature Granulocytes: 0 %
Immature Granulocytes: 0 %
Immature Granulocytes: 1 %
Immature Granulocytes: 1 %
Immature Granulocytes: 0 %
Lymphocytes Relative: 18 %
Lymphocytes Relative: 17 %
Lymphocytes Relative: 8 %
Lymphocytes Relative: 11 %
Lymphocytes Relative: 16 %
Lymphs Abs: 1 10*3/uL (ref 0.7–4.0)
Lymphs Abs: 0.9 10*3/uL (ref 0.7–4.0)
Lymphs Abs: 0.6 10*3/uL — ABNORMAL LOW (ref 0.7–4.0)
Lymphs Abs: 0.7 10*3/uL (ref 0.7–4.0)
Lymphs Abs: 0.8 10*3/uL (ref 0.7–4.0)
MCH: 28.3 pg (ref 26.0–34.0)
MCH: 28.3 pg (ref 26.0–34.0)
MCH: 28.1 pg (ref 26.0–34.0)
MCH: 28.7 pg (ref 26.0–34.0)
MCH: 28.1 pg (ref 26.0–34.0)
MCHC: 30.2 g/dL (ref 30.0–36.0)
MCHC: 30.6 g/dL (ref 30.0–36.0)
MCHC: 29.8 g/dL — ABNORMAL LOW (ref 30.0–36.0)
MCHC: 30.2 g/dL (ref 30.0–36.0)
MCHC: 29.8 g/dL — ABNORMAL LOW (ref 30.0–36.0)
MCV: 93.8 fL (ref 80.0–100.0)
MCV: 92.5 fL (ref 80.0–100.0)
MCV: 94.4 fL (ref 80.0–100.0)
MCV: 94.9 fL (ref 80.0–100.0)
MCV: 94.4 fL (ref 80.0–100.0)
Monocytes Absolute: 0.5 10*3/uL (ref 0.1–1.0)
Monocytes Absolute: 0.5 10*3/uL (ref 0.1–1.0)
Monocytes Absolute: 0.5 10*3/uL (ref 0.1–1.0)
Monocytes Absolute: 0.7 10*3/uL (ref 0.1–1.0)
Monocytes Absolute: 0.4 10*3/uL (ref 0.1–1.0)
Monocytes Relative: 10 %
Monocytes Relative: 9 %
Monocytes Relative: 7 %
Monocytes Relative: 10 %
Monocytes Relative: 8 %
Neutro Abs: 3.5 10*3/uL (ref 1.7–7.7)
Neutro Abs: 3.7 10*3/uL (ref 1.7–7.7)
Neutro Abs: 5.6 10*3/uL (ref 1.7–7.7)
Neutro Abs: 4.7 10*3/uL (ref 1.7–7.7)
Neutro Abs: 3.5 10*3/uL (ref 1.7–7.7)
Neutrophils Relative %: 66 %
Neutrophils Relative %: 68 %
Neutrophils Relative %: 80 %
Neutrophils Relative %: 73 %
Neutrophils Relative %: 70 %
Platelets: 125 10*3/uL — ABNORMAL LOW (ref 150–400)
Platelets: 118 10*3/uL — ABNORMAL LOW (ref 150–400)
Platelets: 113 10*3/uL — ABNORMAL LOW (ref 150–400)
Platelets: 118 10*3/uL — ABNORMAL LOW (ref 150–400)
Platelets: 115 10*3/uL — ABNORMAL LOW (ref 150–400)
RBC: 2.72 MIL/uL — ABNORMAL LOW (ref 3.87–5.11)
RBC: 2.79 MIL/uL — ABNORMAL LOW (ref 3.87–5.11)
RBC: 3.06 MIL/uL — ABNORMAL LOW (ref 3.87–5.11)
RBC: 2.96 MIL/uL — ABNORMAL LOW (ref 3.87–5.11)
RBC: 3.02 MIL/uL — ABNORMAL LOW (ref 3.87–5.11)
RDW: 17 % — ABNORMAL HIGH (ref 11.5–15.5)
RDW: 17 % — ABNORMAL HIGH (ref 11.5–15.5)
RDW: 17.1 % — ABNORMAL HIGH (ref 11.5–15.5)
RDW: 17.1 % — ABNORMAL HIGH (ref 11.5–15.5)
RDW: 16.8 % — ABNORMAL HIGH (ref 11.5–15.5)
WBC: 5.3 10*3/uL (ref 4.0–10.5)
WBC: 5.4 10*3/uL (ref 4.0–10.5)
WBC: 7 10*3/uL (ref 4.0–10.5)
WBC: 6.4 10*3/uL (ref 4.0–10.5)
WBC: 5 10*3/uL (ref 4.0–10.5)
nRBC: 0 % (ref 0.0–0.2)
nRBC: 0 % (ref 0.0–0.2)
nRBC: 0 % (ref 0.0–0.2)
nRBC: 0 % (ref 0.0–0.2)
nRBC: 0 % (ref 0.0–0.2)

## 2018-12-19 LAB — BASIC METABOLIC PANEL
Anion gap: 11 (ref 5–15)
BUN: 51 mg/dL — ABNORMAL HIGH (ref 8–23)
CO2: 22 mmol/L (ref 22–32)
Calcium: 8.3 mg/dL — ABNORMAL LOW (ref 8.9–10.3)
Chloride: 106 mmol/L (ref 98–111)
Creatinine, Ser: 2.02 mg/dL — ABNORMAL HIGH (ref 0.44–1.00)
GFR calc Af Amer: 29 mL/min — ABNORMAL LOW (ref >60)
GFR calc non Af Amer: 25 mL/min — ABNORMAL LOW (ref >60)
Glucose, Bld: 107 mg/dL — ABNORMAL HIGH (ref 70–99)
Potassium: 4.2 mmol/L (ref 3.5–5.1)
Sodium: 139 mmol/L (ref 135–145)

## 2018-12-19 LAB — GLUCOSE, CAPILLARY
Glucose-Capillary: 101 mg/dL — ABNORMAL HIGH (ref 70–99)
Glucose-Capillary: 109 mg/dL — ABNORMAL HIGH (ref 70–99)
Glucose-Capillary: 110 mg/dL — ABNORMAL HIGH (ref 70–99)
Glucose-Capillary: 116 mg/dL — ABNORMAL HIGH (ref 70–99)
Glucose-Capillary: 116 mg/dL — ABNORMAL HIGH (ref 70–99)
Glucose-Capillary: 126 mg/dL — ABNORMAL HIGH (ref 70–99)
Glucose-Capillary: 136 mg/dL — ABNORMAL HIGH (ref 70–99)

## 2018-12-19 SURGERY — ENTEROSCOPY
Anesthesia: Monitor Anesthesia Care

## 2018-12-19 MED ORDER — PROPOFOL 500 MG/50ML IV EMUL
INTRAVENOUS | Status: DC | PRN
  Administered 2018-12-19: 100 ug/kg/min via INTRAVENOUS

## 2018-12-19 MED ORDER — PROPOFOL 10 MG/ML IV BOLUS
INTRAVENOUS | Status: DC | PRN
  Administered 2018-12-19: 20 mg via INTRAVENOUS
  Administered 2018-12-19: 30 mg via INTRAVENOUS
  Administered 2018-12-19 (×2): 20 mg via INTRAVENOUS

## 2018-12-19 MED ORDER — EPINEPHRINE 1 MG/10ML IJ SOSY
PREFILLED_SYRINGE | INTRAMUSCULAR | Status: AC
  Filled 2018-12-19: qty 10

## 2018-12-19 MED ORDER — PROPOFOL 500 MG/50ML IV EMUL
INTRAVENOUS | Status: AC
  Filled 2018-12-19: qty 50

## 2018-12-19 MED ORDER — LACTATED RINGERS IV SOLN
INTRAVENOUS | Status: DC | PRN
  Administered 2018-12-19 (×2): via INTRAVENOUS

## 2018-12-19 MED ORDER — SODIUM CHLORIDE (PF) 0.9 % IJ SOLN
PREFILLED_SYRINGE | INTRAMUSCULAR | Status: DC | PRN
  Administered 2018-12-19: 5 mL

## 2018-12-19 MED ORDER — LIDOCAINE 2% (20 MG/ML) 5 ML SYRINGE
INTRAMUSCULAR | Status: DC | PRN
  Administered 2018-12-19: 60 mg via INTRAVENOUS

## 2018-12-19 MED ORDER — EPHEDRINE SULFATE-NACL 50-0.9 MG/10ML-% IV SOSY
PREFILLED_SYRINGE | INTRAVENOUS | Status: DC | PRN
  Administered 2018-12-19: 5 mg via INTRAVENOUS

## 2018-12-19 MED ORDER — PHENYLEPHRINE 40 MCG/ML (10ML) SYRINGE FOR IV PUSH (FOR BLOOD PRESSURE SUPPORT)
PREFILLED_SYRINGE | INTRAVENOUS | Status: DC | PRN
  Administered 2018-12-19: 80 ug via INTRAVENOUS
  Administered 2018-12-19: 120 ug via INTRAVENOUS
  Administered 2018-12-19 (×2): 80 ug via INTRAVENOUS

## 2018-12-19 NOTE — Op Note (Signed)
[facility] Patient Name: [name] Procedure Date: 12/19/2018 MRN: [id number] Attending MD: Carlota Raspberry. Havery Moros , MD Date of Birth: 05/10/1955 CSN: [id number] Age: 63 Admit Type: Inpatient Procedure:                Small bowel enteroscopy Indications:              Obscure gastrointestinal bleeding - prior extensive                            evaluation with upper endoscopy, capsule,                            colonoscopy. Capsule previously showed small bowel                            AVM. Enteroscopy 2 days ago showed 2 AVMs, one                            bleeding, in the jejunum, treated with APC. Patient                            has since had rebleeding, repeat enteroscopy done                            to re-evaluate Providers:                Carlota Raspberry. Havery Moros, MD, Cleda Daub, RN,                            William Dalton, Technician, Janeece Agee, Technician Referring MD:              Medicines:                Monitored Anesthesia Care Complications:            No immediate complications. Estimated blood loss:                            Minimal. Estimated Blood Loss:     Estimated blood loss was minimal. Procedure:                Pre-Anesthesia Assessment:                           - Prior to the procedure, a History and Physical                            was performed, and patient medications and                            allergies were reviewed. The patient's tolerance of                            previous anesthesia was also reviewed. The risks                            and benefits  of the procedure and the sedation                            options and risks were discussed with the patient.                            All questions were answered, and informed consent                            was obtained. Prior Anticoagulants: The patient has                            taken no previous anticoagulant or antiplatelet          agents. ASA Grade Assessment: III - A patient with                            severe systemic disease. After reviewing the risks                            and benefits, the patient was deemed in                            satisfactory condition to undergo the procedure.                           After obtaining informed consent, the endoscope was                            passed under direct vision. Throughout the                            procedure, the patient's blood pressure, pulse, and                            oxygen saturations were monitored continuously. The                            [id number]-[id number] ([id number]) Olympus pediatric colonscope                            was introduced through the mouth and advanced to                            the proximal jejunum. The small bowel enteroscopy                            was accomplished without difficulty. The patient                            tolerated the procedure well. Scope In: Scope Out: Findings:      The esophagus was normal.      The stomach was normal. Upon removal of the enteroscope there was noted       to be some contact  trauma / small amount of adherent heme which was not       present during intubation, and related to the procedure without any       active bleeding.      There was no evidence of significant pathology in the entire examined       duodenum.      One superficial jejunal ulcer with pigmented material, suspected vessel,       was found in the jejunum at the site of prior APC therapy (distal to and       close approximation to the previously placed tattoo). There was no       active bleeding noted or blood in the lumen but given recent bleeding       symptoms this was treated. The lesion was very difficult to get good       access to treat given its location (pediatric scope hubbed to the       incisors). Initially pediatric colonoscopy was used but the area is       located at a turn where it makes  clip deployment and placement very       difficult, with the scope hubbed to the incisors. The pediatric       colonoscope was removed and the enteroscope was placed. One hemostatic       clip was successfully placed across the lesion although it was       technically quite challenging to do this due to the location. Other       attempts at clip placement not successful, could not get good access to       the site, looping occured, and was concerned about causing mucosal       trauma with further attempts. To reach the site with the clip it had to       be protruded out of the scope well proximal to the area (it would jam in       the scope at the angulated turn of the site with attempts at deployment       initially) and then scope advanced with the clip out to even reach the       site given it's location. Area was then successfully injected with 6 mL       of a 1:10,000 solution of epinephrine for drug delivery.      One non-bleeding superficial jejunal ulcer with pigmented material was       found in the proximal jejunum at the site of the previous APC therapy.       No active bleeding noted but given recent symptoms three hemostatic       clips were successfully placed.      Exam of the jejunum was otherwise normal. No heme in the small bowel       appreciated. Impression:               - Normal esophagus.                           - Normal stomach.                           - Normal examined duodenum.                           - Two areas of ulceration c/w  areas treated                            previously by APC, both with pigmented material,                            one with suspected vessel - treated endoscopically                            as above. It's possible the patient rebled from the                            previously treated sites from prior enteroscopy, I                            suspect the more distal site if this occurred,                            versus  bleeding further distal in the small bowel                            to these lesions. However, no active bleeding or                            blood noted in the lumen on this exam otherwise. Recommendation:           - Return patient to hospital ward for ongoing care.                           - NPO for now, monitor for recurrent bleeding                           - Continue present medications.                           - If the patient has recurrent active bleeding,                            recommend tagged RBC scan to help with localization                            and consideration for balloon enteroscope by                            advanced endoscopy. Patient has CKD (GFR 20-30s),                            not good candidate for IR embolization. Please                            contact me with recurrent bleeding, will continue                            to follow Procedure  Code(s):        --- Professional ---                           [phone], Small intestinal endoscopy, enteroscopy                            beyond second portion of duodenum, not including                            ileum; with control of bleeding (eg, injection,                            bipolar cautery, unipolar cautery, laser, heater                            probe, stapler, plasma coagulator)                           44799, Unlisted procedure, small intestine Diagnosis Code(s):        --- Professional ---                           K28.9, Gastrojejunal ulcer, unspecified as acute or                            chronic, without hemorrhage or perforation                           K92.2, Gastrointestinal hemorrhage, unspecified CPT copyright 2019 American Medical Association. All rights reserved. The codes documented in this report are preliminary and upon coder review may  be revised to meet current compliance requirements. Remo Lipps P. Havery Moros, MD 12/19/2018 10:06:48 AM This report has been signed  electronically. Number of Addenda: 0

## 2018-12-19 NOTE — Anesthesia Preprocedure Evaluation (Addendum)
Anesthesia Evaluation  Patient identified by MRN, date of birth, ID band Patient awake    Reviewed: Allergy & Precautions, NPO status , Patient's Chart, lab work & pertinent test results  Airway Mallampati: II  TM Distance: >3 FB     Dental  (+) Dental Advisory Given   Pulmonary sleep apnea , COPD, Current Smoker,    breath sounds clear to auscultation       Cardiovascular hypertension, Pt. on home beta blockers and Pt. on medications + CAD, + Past MI and +CHF   Rhythm:Regular Rate:Normal     Neuro/Psych  Neuromuscular disease    GI/Hepatic PUD, GERD  ,(+) Hepatitis -, C  Endo/Other  diabetes  Renal/GU CRFRenal disease     Musculoskeletal   Abdominal   Peds  Hematology  (+) anemia ,   Anesthesia Other Findings   Reproductive/Obstetrics                             Lab Results  Component Value Date   WBC 5.4 12/19/2018   HGB 7.9 (L) 12/19/2018   HCT 25.8 (L) 12/19/2018   MCV 92.5 12/19/2018   PLT 118 (L) 12/19/2018   Lab Results  Component Value Date   CREATININE 2.02 (H) 12/19/2018   BUN 51 (H) 12/19/2018   NA 139 12/19/2018   K 4.2 12/19/2018   CL 106 12/19/2018   CO2 22 12/19/2018    Anesthesia Physical Anesthesia Plan  ASA: III  Anesthesia Plan: MAC   Post-op Pain Management:    Induction: Intravenous  PONV Risk Score and Plan: 1 and Propofol infusion, Ondansetron and Treatment may vary due to age or medical condition  Airway Management Planned: Natural Airway and Nasal Cannula  Additional Equipment:   Intra-op Plan:   Post-operative Plan:   Informed Consent: I have reviewed the patients History and Physical, chart, labs and discussed the procedure including the risks, benefits and alternatives for the proposed anesthesia with the patient or authorized representative who has indicated his/her understanding and acceptance.     Dental advisory given  Plan  Discussed with: CRNA  Anesthesia Plan Comments:        Anesthesia Quick Evaluation

## 2018-12-19 NOTE — Transfer of Care (Signed)
Immediate Anesthesia Transfer of Care Note  Patient: [name]  Procedure(s) Performed: ENTEROSCOPY (N/A ) HEMOSTASIS CLIP PLACEMENT SUBMUCOSAL INJECTION  Patient Location: Endoscopy Unit  Anesthesia Type:MAC  Level of Consciousness: awake and patient cooperative  Airway & Oxygen Therapy: Patient Spontanous Breathing and Patient connected to nasal cannula oxygen  Post-op Assessment: Report given to RN and Post -op Vital signs reviewed and stable  Post vital signs: Reviewed and stable  Last Vitals:  Vitals Value Taken Time  BP    Temp    Pulse 65 12/19/18 0946  Resp 19 12/19/18 0946  SpO2 96 % 12/19/18 0946  Vitals shown include unvalidated device data.  Last Pain:  Vitals:   12/19/18 0757  TempSrc: Oral  PainSc: 0-No pain         Complications: No apparent anesthesia complications

## 2018-12-19 NOTE — Anesthesia Procedure Notes (Signed)
Procedure Name: MAC Date/Time: 12/19/2018 8:16 AM Performed by: Claudia Desanctis, CRNA Pre-anesthesia Checklist: Patient identified, Emergency Drugs available, Suction available and Patient being monitored Patient Re-evaluated:Patient Re-evaluated prior to induction Oxygen Delivery Method: Simple face mask

## 2018-12-19 NOTE — Progress Notes (Signed)
PROGRESS NOTE    [name]  QQP:[id number] DOB: February 18, 1955 DOA: 12/16/2018 PCP: [place]   Brief Narrative:  Patient is a 29 female with a past medical history of COPD with ongoing tobacco abuse, history of gastric ulcer, AVMs, diabetes mellitus, chronic back pain, CHF, iron deficiency anemia due to chronic blood loss, chronic systolic heart failure, history of coronary artery disease, chronic hepatitis C who presents from nursing facility for the evaluation of 2-day history of maroon-colored bloody bowel movements.  She was recently hospitalized here from 11/3-11/20 for GI bleed.  At that time she underwent extensive GI evaluation.  When she presented here this time, her hemoglobin was 4.8.  GI consulted.  She has been transfused with PRBCs.  Total of 4 units.  GI consulted and she underwent push enteroscopy with finding of 2 angiodysplastic lesion in jejunum.  APC applied .hemoglobin this morning in the  range of 7.   She was having maroon-colored stools since yesterday after the procedure.  Underwent  reenteroscopy today.  Not found to have active bleeding.   Assessment & Plan:   Principal Problem:   GI bleed Active Problems:   Chronic systolic heart failure (HCC)   Diabetes (HCC)   COPD (chronic obstructive pulmonary disease) (HCC)   Symptomatic anemia   Upper GI bleed   B12 deficiency   Iron deficiency anemia due to chronic blood loss   Folate deficiency   Anxiety   CAD (coronary artery disease)   Chronic back pain   Depression   Diabetes mellitus type 2, uncontrolled (HCC)   GERD (gastroesophageal reflux disease)   Hyperlipidemia   Hypertension   Obesity   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HCC)   Gastritis and gastroduodenitis   AVM (arteriovenous malformation) of small bowel, acquired with hemorrhage   Acute GI bleed/hematochezia: C  She presented with maroon-colored stools for 2 days from skilled nursing facility.  FOBT positive.  Hemoglobin was 4.8 on  admission.  Recently hospitalized this month and underwent GI evaluation with finding of AVM as noted on capsule endoscopy.  She underwent studies with colonoscopy, enteroscopy, capsule study, tagged RBC scan. underwent transfusion with 5 unit of PRBC on last admission.  When she was discharged home her hemoglobin was in the range of 10.  She also has history of gastritis/gastroenteritis. On this admission, she has been transfused with 4 units of PRBC.GI consulted and she underwent push enteroscopy with finding of 2 angiodysplastic lesion in jejunum.  APC applied . She was having maroon-colored stools since yesterday after the procedure.  Underwent  reenteroscopy today.  Not found to have active bleeding. Continue PPI.  Plan for discharge back to skilled nursing facility after GI clearance.  Chronic systolic heart failure/restrictive  cardiomyopathy/coronary artery disease: Currently stable.  Denies any chest pain or shortness of breath.  Has trace edema on bilateral lower extremities.  Continue patient's home regimen of Coreg, Aldactone, Demadex, Lipitor.  Monitor input/output  CKD stage 3:Baseline Creatinine fluctuates from 1.5-2.5.  Currently at baseline.  COPD with ongoing tobacco abuse: Denied nicotine patch.  On Dulera, Spiriva.  Continue bronchodilators as needed.  History of hepatic encephalopathy: Noted on prior hospitalization.  History of hepatitis C/cirrhosis.  On lactulose.  Ammonia level is normal.Has mild thrombocytopenia due to liver cirrhosis  GERD: Continue PPI  Hypertension: Currently blood pressure stable.  Continue Coreg, spironolactone, Demadex  Diabetes type 2: Well controlled.  Hemoglobin A1c of 5.9 on 06/21/2018.  Continue sliding-scale insulin  Folic acid deficiency: Continue folate.  Debility/deconditioning/confusion at baseline:  Nursing home resident.  She is alert, awake but oriented to place and person only.  She states she ambulates with the help of walker.   Confused on baseline.  History of hepatic encephalopathy but ammonia is normal.  Continue supportive care.  Pressure Injury 12/16/18 Sacrum Mid Stage II -  Partial thickness loss of dermis presenting as a shallow open ulcer with a red, pink wound bed without slough. (Active)  12/16/18 1749  Location: Sacrum  Location Orientation: Mid  Staging: Stage II -  Partial thickness loss of dermis presenting as a shallow open ulcer with a red, pink wound bed without slough.  Wound Description (Comments):   Present on Admission: Yes     Pressure Injury 12/16/18 Heel Right Deep Tissue Injury - Purple or maroon localized area of discolored intact skin or blood-filled blister due to damage of underlying soft tissue from pressure and/or shear. (Active)  12/16/18   Location: Heel  Location Orientation: Right  Staging: Deep Tissue Injury - Purple or maroon localized area of discolored intact skin or blood-filled blister due to damage of underlying soft tissue from pressure and/or shear.  Wound Description (Comments):   Present on Admission: Yes              DVT prophylaxis:SCD Code Status: Full Family Communication: Called spouse on phone multiple times,connection unsuccessful Disposition Plan: Back to skilled nursing facility when H and H remains stable.Needs GI clearance   Consultants: GI  Procedures: None  Antimicrobials:  Anti-infectives (From admission, onward)   None      Subjective: Patient seen and examined at the bedside this morning.  Hemodynamically stable.  She started having maroon-colored stools again since yesterday evening.  This morning she was hemodynamically stable.  As always, she denies any complaints.  She is not aware about having bloody stools.  Denies any abdominal pain, nausea or vomiting.  Underwent reenteroscopy today.  Objective: Vitals:   12/19/18 0757 12/19/18 0947 12/19/18 0950 12/19/18 1000  BP: (!) 141/64 137/64 138/65 (!) 143/66  Pulse: 70 63 63 63   Resp: 16 (!) 23 (!) 23 (!) 21  Temp: 98.4 F (36.9 C) 97.6 F (36.4 C)    TempSrc: Oral Oral    SpO2: 98% 96% 96% 96%  Weight: 90.3 kg     Height: 5' 5"  (1.651 m)       Intake/Output Summary (Last 24 hours) at 12/19/2018 1110 Last data filed at 12/19/2018 1975 Gross per 24 hour  Intake 2172.51 ml  Output 1850 ml  Net 322.51 ml   Filed Weights   12/18/18 0432 12/19/18 0500 12/19/18 0757  Weight: 91.6 kg 90.3 kg 90.3 kg    Examination:  General exam: Not in distress, deconditioned, debilitated HEENT:PERRL, Ear/Nose normal on gross exam Respiratory system: Bilateral equal air entry, normal vesicular breath sounds, no wheezes or crackles  Cardiovascular system: S1 & S2 heard, RRR. No JVD, murmurs, rubs, gallops or clicks. Trace pedal edema. Gastrointestinal system: Abdomen is nondistended, soft and nontender. No organomegaly or masses felt. Normal bowel sounds heard. Central nervous system: Alert and awake.oriented x2  Extremities:Tarce pedal  edema, no clubbing ,no cyanosis, amputation of left great toe  skin: No rashes, lesions ,no icterus ,no pallor,stage 2 sacral ulcer Psychiatry: Judgement and insight appear impaired   Data Reviewed: I have personally reviewed following labs and imaging studies  CBC: Recent Labs  Lab 12/16/18 1304 12/17/18 0039 12/17/18 1645 12/18/18 8832 12/18/18 1856 12/19/18 0202 12/19/18 0545  WBC 6.0 5.9  --   --   --  5.3 5.4  NEUTROABS 3.8  --   --   --   --  3.5 3.7  HGB 4.8* 6.6* 10.0* 8.4* 9.0* 7.7* 7.9*  HCT 16.8* 22.0* 31.5* 26.2* 29.5* 25.5* 25.8*  MCV 91.8 90.2  --   --   --  93.8 92.5  PLT 133* 133*  --   --   --  125* 696*   Basic Metabolic Panel: Recent Labs  Lab 12/16/18 1304 12/17/18 0039 12/18/18 0644 12/19/18 0202  NA 136 138 138 139  K 4.8 4.5 4.6 4.2  CL 102 104 104 106  CO2 25 24 23 22   GLUCOSE 192* 142* 108* 107*  BUN 65* 62* 56* 51*  CREATININE 1.97* 1.97* 2.04* 2.02*  CALCIUM 8.2* 8.3* 8.7* 8.3*   GFR:  Estimated Creatinine Clearance: 31.2 mL/min (A) (by C-G formula based on SCr of 2.02 mg/dL (H)). Liver Function Tests: Recent Labs  Lab 12/16/18 1304 12/17/18 0039  AST 25 22  ALT 15 13  ALKPHOS 93 87  BILITOT 0.5 1.2  PROT 7.6 7.2  ALBUMIN 2.9* 2.8*   No results for input(s): LIPASE, AMYLASE in the last 168 hours. Recent Labs  Lab 12/16/18 1304  AMMONIA 20   Coagulation Profile: Recent Labs  Lab 12/16/18 1304  INR 1.3*   Cardiac Enzymes: No results for input(s): CKTOTAL, CKMB, CKMBINDEX, TROPONINI in the last 168 hours. BNP (last 3 results) No results for input(s): PROBNP in the last 8760 hours. HbA1C: Recent Labs    12/17/18 0039  HGBA1C 5.4   CBG: Recent Labs  Lab 12/18/18 1244 12/18/18 1546 12/18/18 2001 12/18/18 2330 12/19/18 0400  GLUCAP 165* 186* 191* 101* 116*   Lipid Profile: No results for input(s): CHOL, HDL, LDLCALC, TRIG, CHOLHDL, LDLDIRECT in the last 72 hours. Thyroid Function Tests: No results for input(s): TSH, T4TOTAL, FREET4, T3FREE, THYROIDAB in the last 72 hours. Anemia Panel: Recent Labs    12/17/18 0039  VITAMINB12 1,051*  FOLATE 34.3  FERRITIN 60  TIBC 294  IRON 42  RETICCTPCT 3.1   Sepsis Labs: No results for input(s): PROCALCITON, LATICACIDVEN in the last 168 hours.  Recent Results (from the past 240 hour(s))  SARS CORONAVIRUS 2 (TAT 6-24 HRS) Nasopharyngeal Nasopharyngeal Swab     Status: None   Collection Time: 12/16/18  2:15 PM   Specimen: Nasopharyngeal Swab  Result Value Ref Range Status   SARS Coronavirus 2 NEGATIVE NEGATIVE Final    Comment: (NOTE) SARS-CoV-2 target nucleic acids are NOT DETECTED. The SARS-CoV-2 RNA is generally detectable in upper and lower respiratory specimens during the acute phase of infection. Negative results do not preclude SARS-CoV-2 infection, do not rule out co-infections with other pathogens, and should not be used as the sole basis for treatment or other patient management  decisions. Negative results must be combined with clinical observations, patient history, and epidemiological information. The expected result is Negative. Fact Sheet for Patients: SugarRoll.be Fact Sheet for Healthcare Providers: https://www.woods-mathews.com/ This test is not yet approved or cleared by the [place] FDA and  has been authorized for detection and/or diagnosis of SARS-CoV-2 by FDA under an Emergency Use Authorization (EUA). This EUA will remain  in effect (meaning this test can be used) for the duration of the COVID-19 declaration under Section 56 4(b)(1) of the Act, 21 U.S.C. section 360bbb-3(b)(1), unless the authorization is terminated or revoked sooner. Performed at [facility], [place]  [address], [place], [place] [address]   MRSA PCR Screening     Status: None   Collection Time: 12/16/18  6:15 PM   Specimen: Nasal Mucosa; Nasopharyngeal  Result Value Ref Range Status   MRSA by PCR NEGATIVE NEGATIVE Final    Comment:        The GeneXpert MRSA Assay (FDA approved for NASAL specimens only), is one component of a comprehensive MRSA colonization surveillance program. It is not intended to diagnose MRSA infection nor to guide or monitor treatment for MRSA infections. Performed at [facility], [place] [address], [place], [place] [address]          Radiology Studies: No results found.      Scheduled Meds: . ammonium lactate  1 application Topical BID  . atorvastatin  40 mg Oral q1800  . carvedilol  3.125 mg Oral BID WC  . Chlorhexidine Gluconate Cloth  6 each Topical Daily  . feeding supplement (PRO-STAT SUGAR FREE 64)  30 mL Oral BID  . fluticasone  2 spray Each Nare Daily  . folic acid  1 mg Oral Daily  . insulin aspart  0-9 Units Subcutaneous Q4H  . lactulose  20 g Oral TID  . mirtazapine  30 mg Oral QHS  . multivitamin with minerals  1 tablet Oral Daily  . [START ON  12/20/2018] pantoprazole  40 mg Intravenous Q12H  . sodium chloride flush  3 mL Intravenous Q12H  . sodium chloride flush  3 mL Intravenous Q12H  . spironolactone  12.5 mg Oral Daily  . torsemide  20 mg Oral Daily   Continuous Infusions: . sodium chloride 100 mL/hr at 12/19/18 0440  . sodium chloride    . sodium chloride    . pantoprozole (PROTONIX) infusion 8 mg/hr (12/19/18 0715)     LOS: 3 days    Time spent: 35 mins.More than 50% of that time was spent in counseling and/or coordination of care.      Shelly Coss, MD [facility] Pager [phone]  If 7PM-7AM, please contact night-coverage [email or web address] Password [email or web address] 12/19/2018, 11:10 AM

## 2018-12-19 NOTE — Interval H&P Note (Signed)
History and Physical Interval Note:  12/19/2018 8:13 AM  [name]  has presented today for surgery, with the diagnosis of GI bleed.  The various methods of treatment have been discussed with the patient and family. After consideration of risks, benefits and other options for treatment, the patient has consented to  Procedure(s): ENTEROSCOPY (N/A) as a surgical intervention.  The patient's history has been reviewed, patient examined, no change in status, stable for surgery.  I have reviewed the patient's chart and labs.  Questions were answered to the patient's satisfaction.     [place]

## 2018-12-20 ENCOUNTER — Encounter (HOSPITAL_COMMUNITY): Payer: Self-pay | Admitting: Registered Nurse

## 2018-12-20 ENCOUNTER — Encounter (HOSPITAL_COMMUNITY)
Admission: EM | Disposition: A | Discharge status: Skilled Nursing Facility | Payer: Self-pay | Source: Skilled Nursing Facility | Attending: Internal Medicine

## 2018-12-20 ENCOUNTER — Inpatient Hospital Stay (HOSPITAL_COMMUNITY): Payer: Medicaid Other | Admitting: Anesthesiology

## 2018-12-20 DIAGNOSIS — K31819 Angiodysplasia of stomach and duodenum without bleeding: Secondary | ICD-10-CM

## 2018-12-20 DIAGNOSIS — K921 Melena: Secondary | ICD-10-CM

## 2018-12-20 DIAGNOSIS — K922 Gastrointestinal hemorrhage, unspecified: Secondary | ICD-10-CM | POA: Diagnosis not present

## 2018-12-20 HISTORY — PX: ENTEROSCOPY: SHX5533

## 2018-12-20 LAB — CBC WITH DIFFERENTIAL/PLATELET
Abs Immature Granulocytes: 0.01 10*3/uL (ref 0.00–0.07)
Basophils Absolute: 0 10*3/uL (ref 0.0–0.1)
Basophils Relative: 1 %
Eosinophils Absolute: 0.3 10*3/uL (ref 0.0–0.5)
Eosinophils Relative: 5 %
HCT: 26.8 % — ABNORMAL LOW (ref 36.0–46.0)
Hemoglobin: 7.9 g/dL — ABNORMAL LOW (ref 12.0–15.0)
Immature Granulocytes: 0 %
Lymphocytes Relative: 15 %
Lymphs Abs: 0.8 10*3/uL (ref 0.7–4.0)
MCH: 28.1 pg (ref 26.0–34.0)
MCHC: 29.5 g/dL — ABNORMAL LOW (ref 30.0–36.0)
MCV: 95.4 fL (ref 80.0–100.0)
Monocytes Absolute: 0.5 10*3/uL (ref 0.1–1.0)
Monocytes Relative: 9 %
Neutro Abs: 3.7 10*3/uL (ref 1.7–7.7)
Neutrophils Relative %: 70 %
Platelets: 118 10*3/uL — ABNORMAL LOW (ref 150–400)
RBC: 2.81 MIL/uL — ABNORMAL LOW (ref 3.87–5.11)
RDW: 17.1 % — ABNORMAL HIGH (ref 11.5–15.5)
WBC: 5.3 10*3/uL (ref 4.0–10.5)
nRBC: 0 % (ref 0.0–0.2)

## 2018-12-20 LAB — GLUCOSE, CAPILLARY
Glucose-Capillary: 120 mg/dL — ABNORMAL HIGH (ref 70–99)
Glucose-Capillary: 94 mg/dL (ref 70–99)
Glucose-Capillary: 104 mg/dL — ABNORMAL HIGH (ref 70–99)
Glucose-Capillary: 92 mg/dL (ref 70–99)
Glucose-Capillary: 182 mg/dL — ABNORMAL HIGH (ref 70–99)

## 2018-12-20 LAB — BASIC METABOLIC PANEL
Anion gap: 9 (ref 5–15)
BUN: 45 mg/dL — ABNORMAL HIGH (ref 8–23)
CO2: 21 mmol/L — ABNORMAL LOW (ref 22–32)
Calcium: 8.2 mg/dL — ABNORMAL LOW (ref 8.9–10.3)
Chloride: 110 mmol/L (ref 98–111)
Creatinine, Ser: 1.99 mg/dL — ABNORMAL HIGH (ref 0.44–1.00)
GFR calc Af Amer: 30 mL/min — ABNORMAL LOW (ref >60)
GFR calc non Af Amer: 26 mL/min — ABNORMAL LOW (ref >60)
Glucose, Bld: 118 mg/dL — ABNORMAL HIGH (ref 70–99)
Potassium: 4 mmol/L (ref 3.5–5.1)
Sodium: 140 mmol/L (ref 135–145)

## 2018-12-20 LAB — HEMOGLOBIN AND HEMATOCRIT, BLOOD
HCT: 27.1 % — ABNORMAL LOW (ref 36.0–46.0)
HCT: 26.5 % — ABNORMAL LOW (ref 36.0–46.0)
Hemoglobin: 8.1 g/dL — ABNORMAL LOW (ref 12.0–15.0)
Hemoglobin: 7.9 g/dL — ABNORMAL LOW (ref 12.0–15.0)

## 2018-12-20 SURGERY — ENTEROSCOPY, USING BALLOON
Anesthesia: Monitor Anesthesia Care

## 2018-12-20 MED ORDER — SPOT INK MARKER SYRINGE KIT
PACK | SUBMUCOSAL | Status: AC
  Filled 2018-12-20: qty 5

## 2018-12-20 MED ORDER — BOOST / RESOURCE BREEZE PO LIQD CUSTOM
1.0000 | Freq: Three times a day (TID) | ORAL | Status: DC
  Administered 2018-12-21 – 2018-12-25 (×11): 1 via ORAL

## 2018-12-20 MED ORDER — LACTATED RINGERS IV SOLN
INTRAVENOUS | Status: DC | PRN
  Administered 2018-12-20: 16:00:00 via INTRAVENOUS

## 2018-12-20 MED ORDER — LIDOCAINE 2% (20 MG/ML) 5 ML SYRINGE
INTRAMUSCULAR | Status: DC | PRN
  Administered 2018-12-20: 40 mg via INTRAVENOUS

## 2018-12-20 MED ORDER — PROPOFOL 500 MG/50ML IV EMUL
INTRAVENOUS | Status: DC | PRN
  Administered 2018-12-20: 100 ug/kg/min via INTRAVENOUS

## 2018-12-20 MED ORDER — HYDRALAZINE HCL 20 MG/ML IJ SOLN: 10.0000 mg | INTRAMUSCULAR | Status: DC | PRN

## 2018-12-20 MED ORDER — ONDANSETRON HCL 4 MG/2ML IJ SOLN
INTRAMUSCULAR | Status: DC | PRN
  Administered 2018-12-20: 4 mg via INTRAVENOUS

## 2018-12-20 MED ORDER — PROPOFOL 10 MG/ML IV BOLUS
INTRAVENOUS | Status: DC | PRN
  Administered 2018-12-20 (×2): 20 mg via INTRAVENOUS

## 2018-12-20 MED ORDER — PRO-STAT SUGAR FREE PO LIQD
30.0000 mL | Freq: Two times a day (BID) | ORAL | Status: DC
  Administered 2018-12-21 – 2019-01-02 (×18): 30 mL via ORAL
  Filled 2018-12-20 (×18): qty 30

## 2018-12-20 MED ORDER — PROPOFOL 500 MG/50ML IV EMUL
INTRAVENOUS | Status: AC
  Filled 2018-12-20: qty 50

## 2018-12-20 MED ORDER — SPOT INK MARKER SYRINGE KIT
PACK | SUBMUCOSAL | Status: DC | PRN
  Administered 2018-12-20: 1 mL via SUBMUCOSAL

## 2018-12-20 NOTE — Interval H&P Note (Signed)
History and Physical Interval Note:  12/20/2018 3:25 PM  [name]  has presented today for surgery, with the diagnosis of GI bleed.  The various methods of treatment have been discussed with the patient and family. After consideration of risks, benefits and other options for treatment, the patient has consented to  Procedure(s): BALLOON ENTEROSCOPY (N/A) ENTEROSCOPY (N/A) as a surgical intervention.  The patient's history has been reviewed, patient examined, no change in status, stable for surgery.  I have reviewed the patient's chart and labs.  Questions were answered to the patient's satisfaction.     Dominic Pea Cirigliano

## 2018-12-20 NOTE — Op Note (Signed)
[facility] Patient Name: [name] Procedure Date: 12/20/2018 MRN: [id number] Attending MD: Gerrit Heck , MD Date of Birth: 1955-07-28 CSN: [id number] Age: 63 Admit Type: Inpatient Procedure:                Small bowel enteroscopy Indications:              Iron deficiency anemia secondary to chronic blood                            loss, , Hematochezia, Obscure gastrointestinal                            bleeding, Arteriovenous malformation in the small                            intestine                           63 year old female with recurrent GI bleeding and                            IDA. She has had multiple endoscopic procedures for                            recurrent bleeding dating back to EGD in 10/2016                            (gastric ulcers treated with heater probe). Has                            subsequently underwent EGD x2, colonoscopy x2                            (hemorrhoids), VCE x2 (proximal small bowel AVMs),                            push enteroscopy x3 (jejunal AVM x1, gastric AVM x3                            in 10/2017, treated with APC; other 2 without                            bleeding). Readmitted earlier this week with                            recurrent bleeding. On this admission, has                            underwent enteroscopy x2. 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  Recurrent bleeding, prompting repeat enteroscopy on                            12/2, notable for ulceration with possible visible                            vessel (possibly prior APC site), treated with                            hemostatic clips x3 and epinephrine. Each of the                            lesions on enteroscopy x2 this week were just out                            of reach of a push enteroscopy, which limited   endoscopic intervention. Overnight, she again had                            maroon-colored stools with clots, and hemoglobin                            decreased from 8.4-7.9, concerning for recurrent                            bleed. Given multiple endoscopic/endoscopic                            studies/interventions, and suspicion for recurrent                            small bowel bleeding, request for deep enteroscopy                            with single balloon for diagnostic and therapeutic                            intent. The additional risks of the study were                            discussed with the patient and her family member,                            and all in agreement to proceed. Providers:                Gerrit Heck, MD, Vista Lawman, RN, Lazaro Arms,                            Technician Referring MD:              Medicines:                Monitored Anesthesia Care Complications:            No immediate complications. Estimated Blood Loss:     Estimated blood loss was  minimal. Procedure:                Pre-Anesthesia Assessment:                           - Prior to the procedure, a History and Physical                            was performed, and patient medications and                            allergies were reviewed. The patient's tolerance of                            previous anesthesia was also reviewed. The risks                            and benefits of the procedure and the sedation                            options and risks were discussed with the patient.                            All questions were answered, and informed consent                            was obtained. Prior Anticoagulants: The patient has                            taken no previous anticoagulant or antiplatelet                            agents. ASA Grade Assessment: III - A patient with                            severe systemic disease. After reviewing the risks                             and benefits, the patient was deemed in                            satisfactory condition to undergo the procedure.                           After obtaining informed consent, the endoscope was                            passed under direct vision. Throughout the                            procedure, the patient's blood pressure, pulse, and                            oxygen saturations were monitored continuously. The  [id number] ([id number]) Olympus enteroscope was                            introduced through the mouth and advanced to the                            distal jejunum. The small bowel enteroscopy was                            accomplished without difficulty. The patient                            tolerated the procedure well. Scope In: Scope Out: Findings:      The examined esophagus was normal.      Scattered mild inflammation characterized by erythema was found in the       gastric body and in the gastric antrum. This was noted on the recent       enteroscopy x2 as well.      There was no evidence of significant pathology in the entire examined       duodenum.      A tattoo was seen in the mid-jejunum.      Three clips were found in the mid jejunum in close proximity to the       tattoo. These appeared in good position with normal surrounding mucosa.       An additional clip was noted 20 cm distal to this area. This appeared in       good position. The mucosa surrounding the single clip was mildly       erythematous with mucusal deformity likely consistent with recent APC       treatment. There was otherwise no bleeding or high risk stigmata noted.      The enteroscope was advance another 80 cm beyond the most distal clip       (100 cm beyond the previous tattoo), into the distal jejunum, or perhaps       even the proximal ileum. The mucosa was normal throughout, without any       areas of recent bleeding. No blood was noted  throughout the GI lumen.       Area was tattooed with an injection of 1 mL of Spot (carbon black) to       mark the extent reached and the enteroscope was slowly withdrawn, with       serial deflation, withdrawal, and reinflation of the balloon to maintain       excellent views. No additional pathology was noted on slow withdrawal. Impression:               - Normal esophagus.                           - Mild Gastritis.                           - Normal examined duodenum.                           - A tattoo was seen in the jejunum, with 3 clips in  close proximity to the tattoo. The area surrounding                            mucosa was normal in this area. An additional clip                            was noted approximately 20 cm distal to the tattoo.                            The mucosa surrounding the single clip was mildly                            erythematous with mucusal deformity likely                            consistent with recent APC treatment. However,                            there was no active bleeding nor high grade                            stigmata to warrant further endoscopic intervention.                           - The remainder of the small bowel otherwise                            appeared completely normal. No blood noted                            throughout the lumen.Tattoo placed at extent                            reached. Moderate Sedation:      Not Applicable - Patient had care per Anesthesia. Recommendation:           - Return patient to hospital ward for ongoing care.                           - Continue present medications.                           - If concern for rebleeding, consider tagged RBC                            scan or repeat Video Capsule Endoscopy. If concern                            for more distal bleed, would recommend either IR                            consultation or [facility] either double balloon enteroscopy.                           - Full liquids today, and if no recurrent bleeding,                            can advance diet as tolerated tomorrow.                           - Discussed with the patient and her husband at                            length following the procedure, along with the                            inpatient service. Procedure Code(s):        --- Professional ---                           [id number], Small intestinal endoscopy, enteroscopy                            beyond second portion of duodenum, not including                            ileum; diagnostic, including collection of                            specimen(s) by brushing or washing, when performed                            (separate procedure)                           44799, Unlisted procedure, small intestine Diagnosis Code(s):        --- Professional ---                           K29.70, Gastritis, unspecified, without bleeding                           T18.3XXA, Foreign body in small intestine, initial                            encounter                           D50.0, Iron deficiency anemia secondary to blood                            loss (chronic)                           K92.1, Melena (includes Hematochezia)                           K92.2, Gastrointestinal hemorrhage,  unspecified                           K31.819, Angiodysplasia of stomach and duodenum                            without bleeding CPT copyright 2019 [employer]. All rights reserved. The codes documented in this report are preliminary and upon coder review may  be revised to meet current compliance requirements. Gerrit Heck, MD 12/20/2018 5:37:47 PM Number of Addenda: 0

## 2018-12-20 NOTE — Transfer of Care (Signed)
Immediate Anesthesia Transfer of Care Note  Patient: [name]  Procedure(s) Performed: BALLOON ENTEROSCOPY (N/A ) ENTEROSCOPY (N/A )  Patient Location: PACU  Anesthesia Type:MAC  Level of Consciousness: sedated  Airway & Oxygen Therapy: Patient Spontanous Breathing and Patient connected to face mask oxygen  Post-op Assessment: Report given to RN and Post -op Vital signs reviewed and stable  Post vital signs: Reviewed and stable  Last Vitals:  Vitals Value Taken Time  BP    Temp    Pulse 67 12/20/18 1644  Resp 17 12/20/18 1644  SpO2 100 % 12/20/18 1644  Vitals shown include unvalidated device data.  Last Pain:  Vitals:   12/20/18 1535  TempSrc:   PainSc: 0-No pain         Complications: No apparent anesthesia complications

## 2018-12-20 NOTE — Anesthesia Procedure Notes (Signed)
Date/Time: 12/20/2018 3:35 PM Performed by: Talbot Grumbling, CRNA Oxygen Delivery Method: Simple face mask

## 2018-12-20 NOTE — H&P (View-Only) (Signed)
Gastroenterology Progress Note  CC:  GI bleed  Subjective:  Hgb 7.9 grams this AM.  Nurse reports a couple of maroon stools with clots overnight that were reported to her, but none since her shift starting at 7AM today.  Enteroscopy again yesterday showed the following:  - Two areas of ulceration c/w areas treated previously by APC, both with pigmented material, one with suspected vessel - treated endoscopically as above. It's possible the patient rebled from the previously treated sites from prior enteroscopy, I suspect the more distal site if this occurred, versus bleeding further distal in the small bowel to these lesions. However, no active bleeding or blood noted in the lumen on this exam otherwise.  Objective:  Vital signs in last 24 hours: Temp:  [97.3 F (36.3 C)-97.6 F (36.4 C)] 97.6 F (36.4 C) (12/03 0400) Pulse Rate:  [63-79] 69 (12/03 0800) Resp:  [6-27] 14 (12/03 0800) BP: (91-177)/(36-124) 133/62 (12/03 0800) SpO2:  [95 %-98 %] 97 % (12/03 0800) Weight:  [90.3 kg] 90.3 kg (12/03 0500) Last BM Date: 12/18/18 General:   Alert,  Well-developed,    in NAD Heart:  Regular rate and rhythm; no murmurs Pulm; Abdomen:  Soft, nontender and nondistended. Normal bowel sounds, without guarding, and without rebound.   Extremities:  Without edema. Neurologic:  Alert and  oriented x4;  grossly normal neurologically. Psych:  Alert and cooperative. Normal mood and affect.  Intake/Output from previous day: 12/02 0701 - 12/03 0700 In: 1556.8 [P.O.:140; I.V.:1416.8] Out: 950 [Urine:950]  Lab Results: Recent Labs    12/19/18 1440 12/19/18 2252 12/20/18 0542  WBC 6.4 5.0 5.3  HGB 8.5* 8.5* 7.9*  HCT 28.1* 28.5* 26.8*  PLT 118* 115* 118*   BMET Recent Labs    12/18/18 0644 12/19/18 0202 12/20/18 0542  NA 138 139 140  K 4.6 4.2 4.0  CL 104 106 110  CO2 23 22 21*  GLUCOSE 108* 107* 118*  BUN 56* 51* 45*  CREATININE 2.04* 2.02* 1.99*  CALCIUM 8.7* 8.3*  8.2*   Assessment / Plan: 1. 63 yo female with hx of recurrent obscure GI bleed / profound IDA. Admitted with maroon stools. Extensive workup in past. 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 Treated with argon.  Repeat enteroscopy on 12/2 showed the following: - Two areas of ulceration c/w areas treated previously by APC, both with pigmented material, one with suspected vessel - treated endoscopically as above. It's possible the patient rebled from the previously treated sites from prior enteroscopy, I suspect the more distal site if this occurred, versus bleeding further distal in the small bowel to these lesions. However, no active bleeding or blood noted in the lumen on this exam otherwise.  Had a couple of maroon stools with clots overnight. -This is a difficult situation.  She did pass a couple of maroon stools with clots overnight and her hemoglobin is down slightly.  Unsure if she is still actively bleeding or if this was old blood with associated drop in hemoglobin from equilibration.  Next step if further evaluation is needed would be a balloon enteroscopy.  We do not want to do this unless absolutely necessary for multiple reasons, but also clips that were placed previously could be disrupted with the balloon.  Timing wise is also difficult as we have limited availability to physicians that perform this procedure  as well as availability on timing with them to be able to have this performed.  My attendings are going to discuss the situation and decide on any further evaluation.  We will keep her n.p.o. for now until decision is made.  2.  Blood loss anemia:  Has had 4 units of PRBC's.  Hgb down some ovenight from 8.4 to 7.9 grams. -Monitor Hgb and transfuse further prn.  3.  CKD3-slight increase in Cr stable for the most part.  3. Possible  cirrhosis based on 06/21/18 CT AP wo contrast. 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 No portal hypertensive findings on EGD. She does have thrombocytopenia and mild coagulopathy.  -No evidence for hepatic decompensation, can follow up outpatient.  -Will need viral hep vaccines as outpatient -can check HCV viral count as outpatient and if +, consider treatment.                LOS: 4 days   Laban Emperor. Jeffie Widdowson  12/20/2018, 9:10 AM

## 2018-12-20 NOTE — Progress Notes (Signed)
[facility] Progress Note  CC:  GI bleed  Subjective:  Hgb 7.9 grams this AM.  Nurse reports a couple of maroon stools with clots overnight that were reported to her, but none since her shift starting at 7AM today.  Enteroscopy again yesterday showed the following:  - Two areas of ulceration c/w areas treated previously by APC, both with pigmented material, one with suspected vessel - treated endoscopically as above. It's possible the patient rebled from the previously treated sites from prior enteroscopy, I suspect the more distal site if this occurred, versus bleeding further distal in the small bowel to these lesions. However, no active bleeding or blood noted in the lumen on this exam otherwise.  Objective:  Vital signs in last 24 hours: Temp:  [97.3 F (36.3 C)-97.6 F (36.4 C)] 97.6 F (36.4 C) (12/03 0400) Pulse Rate:  [63-79] 69 (12/03 0800) Resp:  [6-27] 14 (12/03 0800) BP: (91-177)/(36-124) 133/62 (12/03 0800) SpO2:  [95 %-98 %] 97 % (12/03 0800) Weight:  [90.3 kg] 90.3 kg (12/03 0500) Last BM Date: 12/18/18 General:   Alert,  Well-developed,    in NAD Heart:  Regular rate and rhythm; no murmurs Pulm; Abdomen:  Soft, nontender and nondistended. Normal bowel sounds, without guarding, and without rebound.   Extremities:  Without edema. Neurologic:  Alert and  oriented x4;  grossly normal neurologically. Psych:  Alert and cooperative. Normal mood and affect.  Intake/Output from previous day: 12/02 0701 - 12/03 0700 In: 1556.8 [P.O.:140; I.V.:1416.8] Out: 950 [Urine:950]  Lab Results: Recent Labs    12/19/18 1440 12/19/18 2252 12/20/18 0542  WBC 6.4 5.0 5.3  HGB 8.5* 8.5* 7.9*  HCT 28.1* 28.5* 26.8*  PLT 118* 115* 118*   BMET Recent Labs    12/18/18 0644 12/19/18 0202 12/20/18 0542  NA 138 139 140  K 4.6 4.2 4.0  CL 104 106 110  CO2 23 22 21*  GLUCOSE 108* 107* 118*  BUN 56* 51* 45*  CREATININE 2.04* 2.02* 1.99*  CALCIUM 8.7* 8.3*  8.2*   Assessment / Plan: 1. 63 yo female with hx of recurrent obscure GI bleed / profound IDA. Admitted with maroon stools. Extensive workup in past. 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 Treated with argon.  Repeat enteroscopy on 12/2 showed the following: - Two areas of ulceration c/w areas treated previously by APC, both with pigmented material, one with suspected vessel - treated endoscopically as above. It's possible the patient rebled from the previously treated sites from prior enteroscopy, I suspect the more distal site if this occurred, versus bleeding further distal in the small bowel to these lesions. However, no active bleeding or blood noted in the lumen on this exam otherwise.  Had a couple of maroon stools with clots overnight. -This is a difficult situation.  She did pass a couple of maroon stools with clots overnight and her hemoglobin is down slightly.  Unsure if she is still actively bleeding or if this was old blood with associated drop in hemoglobin from equilibration.  Next step if further evaluation is needed would be a balloon enteroscopy.  We do not want to do this unless absolutely necessary for multiple reasons, but also clips that were placed previously could be disrupted with the balloon.  Timing wise is also difficult as we have limited availability to physicians that perform this procedure  as well as availability on timing with them to be able to have this performed.  My attendings are going to discuss the situation and decide on any further evaluation.  We will keep her n.p.o. for now until decision is made.  2.  Blood loss anemia:  Has had 4 units of PRBC's.  Hgb down some ovenight from 8.4 to 7.9 grams. -Monitor Hgb and transfuse further prn.  3.  CKD3-slight increase in Cr stable for the most part.  3. Possible  cirrhosis based on 06/21/18 CT AP wo contrast. 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 No portal hypertensive findings on EGD. She does have thrombocytopenia and mild coagulopathy.  -No evidence for hepatic decompensation, can follow up outpatient.  -Will need viral hep vaccines as outpatient -can check HCV viral count as outpatient and if +, consider treatment.                LOS: 4 days   Laban Emperor.   12/20/2018, 9:10 AM

## 2018-12-20 NOTE — Progress Notes (Signed)
PROGRESS NOTE    MERLINDA WRUBEL  UDJ:[id number] DOB: 1955/06/08 DOA: 12/16/2018 PCP: [place]   Brief Narrative:  Patient is a 13 female with a past medical history of COPD with ongoing tobacco abuse, history of gastric ulcer, AVMs, diabetes mellitus, chronic back pain, CHF, iron deficiency anemia due to chronic blood loss, chronic systolic heart failure, history of coronary artery disease, chronic hepatitis C ,who presents from nursing facility for the evaluation of 2-day history of maroon-colored bloody bowel movements.  She was recently hospitalized here from 11/3-11/20 for GI bleed.  At that time she underwent extensive GI evaluation.  When she presented here this time, her hemoglobin was 4.8.    She has been transfused with PRBCs.  Total of 4 units.  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  APC applied . She was having maroon-colored stools after the procedure.  So she underwent  reenteroscopy on 12/19/18.  Not found to have active bleeding.  She again had maroon-colored liquidy stools last night .  GI planning for balloon enteroscopy today.  Hemoglobin in the range of 7 this morning.  Assessment & Plan:   Principal Problem:   GI bleed Active Problems:   Chronic systolic heart failure (HCC)   Diabetes (HCC)   COPD (chronic obstructive pulmonary disease) (HCC)   Symptomatic anemia   Upper GI bleed   B12 deficiency   Iron deficiency anemia due to chronic blood loss   Folate deficiency   Anxiety   CAD (coronary artery disease)   Chronic back pain   Depression   Diabetes mellitus type 2, uncontrolled (HCC)   GERD (gastroesophageal reflux disease)   Hyperlipidemia   Hypertension   Obesity   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HCC)   Gastritis and gastroduodenitis   AVM (arteriovenous malformation) of small bowel, acquired with hemorrhage   Acute GI bleed/hematochezia: C  She presented with  maroon-colored stools for 2 days from skilled nursing facility.  FOBT positive.  Hemoglobin was 4.8 on admission.  Recently hospitalized this month and underwent GI evaluation with finding of AVM as noted on capsule endoscopy.  She underwent studies with colonoscopy, enteroscopy, capsule study, tagged RBC scan. underwent transfusion with 5 unit of PRBC on last admission.  When she was discharged home her hemoglobin was in the range of 10.  She also has history of gastritis/gastroenteritis. On this admission, she has been transfused with 4 units of PRBC.  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  APC applied . She was having maroon-colored stools after the procedure.  So she underwent  reenteroscopy on 12/19/18.  Not found to have active bleeding.  She again had maroon-colored liquidy stools last night .  GI planning for balloon enteroscopy today.  Hemoglobin in the range of 7 this morning.   Chronic systolic heart failure/restrictive  cardiomyopathy/coronary artery disease: Currently stable.  Denies any chest pain or shortness of breath.  Has trace edema on bilateral lower extremities.  Continue patient's home regimen of Coreg, Aldactone, Demadex, Lipitor.  Monitor input/output  CKD stage 3:Baseline Creatinine fluctuates from 1.5-2.5.  Currently at baseline.  COPD with ongoing tobacco abuse: Denied nicotine patch.  On Dulera, Spiriva.  Continue bronchodilators as needed.  History of hepatic encephalopathy: Noted on prior hospitalization.  History of hepatitis C/cirrhosis.  On lactulose.  Ammonia level is normal.Has mild thrombocytopenia due to liver cirrhosis  GERD: Continue PPI  Hypertension: Currently blood pressure stable.  Continue Coreg, spironolactone, Demadex  Diabetes type 2: Well controlled.  Hemoglobin A1c of 5.9 on 06/21/2018.  Continue sliding-scale insulin  Folic acid deficiency: Continue folate.   Debility/deconditioning/confusion at baseline:  Nursing home resident.  She is alert, awake but oriented to place and person only.  She states she ambulates with the help of walker.  Confused on baseline.  History of hepatic encephalopathy but ammonia is normal.  Continue supportive care.  Pressure Injury 12/16/18 Sacrum Mid Stage II -  Partial thickness loss of dermis presenting as a shallow open ulcer with a red, pink wound bed without slough. (Active)  12/16/18 1749  Location: Sacrum  Location Orientation: Mid  Staging: Stage II -  Partial thickness loss of dermis presenting as a shallow open ulcer with a red, pink wound bed without slough.  Wound Description (Comments):   Present on Admission: Yes     Pressure Injury 12/16/18 Heel Right Deep Tissue Injury - Purple or maroon localized area of discolored intact skin or blood-filled blister due to damage of underlying soft tissue from pressure and/or shear. (Active)  12/16/18   Location: Heel  Location Orientation: Right  Staging: Deep Tissue Injury - Purple or maroon localized area of discolored intact skin or blood-filled blister due to damage of underlying soft tissue from pressure and/or shear.  Wound Description (Comments):   Present on Admission: Yes              DVT prophylaxis:SCD Code Status: Full Family Communication: Called spouse on phone multiple times,connection unsuccessful Disposition Plan: Back to skilled nursing facility when H and H remains stable and she remains free of bloody bowel movements.Needs GI clearance   Consultants: GI  Procedures: None  Antimicrobials:  Anti-infectives (From admission, onward)   None      Subjective: Patient seen and examined the bedside this morning.  Hemodynamically stable.  I was reported that she again had maroon color liquid stools last night.  Hemoglobin in the range of 7.9.  She is not aware of any kind of bowel movement.  She denies any nausea, vomiting or abdominal  pain.  Does not have any questions  Objective: Vitals:   12/20/18 0600 12/20/18 0700 12/20/18 0800 12/20/18 0927  BP:  (!) 91/36 133/62 (!) 110/56  Pulse: 68 68 69 73  Resp: (!) 6 12 14    Temp:   97.7 F (36.5 C)   TempSrc:   Oral   SpO2: 97% 95% 97%   Weight:      Height:        Intake/Output Summary (Last 24 hours) at 12/20/2018 1105 Last data filed at 12/20/2018 0600 Gross per 24 hour  Intake 556.78 ml  Output 950 ml  Net -393.22 ml   Filed Weights   12/19/18 0500 12/19/18 0757 12/20/18 0500  Weight: 90.3 kg 90.3 kg 90.3 kg    Examination:  General exam: Not in distress, deconditioned, debilitated HEENT:PERRL, Ear/Nose normal on gross exam Respiratory system: Bilateral equal air entry, normal vesicular breath sounds, no wheezes or crackles  Cardiovascular system: S1 & S2 heard, RRR. No JVD, murmurs, rubs, gallops or clicks. Trace pedal edema. Gastrointestinal system: Abdomen is nondistended, soft and nontender. No organomegaly or masses felt. Normal bowel sounds heard. Central nervous system: Alert and awake.oriented x2  Extremities:Trace pedal  edema, no clubbing ,no cyanosis, amputation of left great toe  skin: No rashes, lesions ,no icterus ,no pallor,stage 2 sacral ulcer Psychiatry: Judgement and insight appear impaired  Data Reviewed: I have personally reviewed following labs and imaging studies  CBC: Recent Labs  Lab 12/19/18 0545 12/19/18 1048 12/19/18 1440 12/19/18 2252 12/20/18 0542  WBC 5.4 7.0 6.4 5.0 5.3  NEUTROABS 3.7 5.6 4.7 3.5 3.7  HGB 7.9* 8.6* 8.5* 8.5* 7.9*  HCT 25.8* 28.9* 28.1* 28.5* 26.8*  MCV 92.5 94.4 94.9 94.4 95.4  PLT 118* 113* 118* 115* 779*   Basic Metabolic Panel: Recent Labs  Lab 12/16/18 1304 12/17/18 0039 12/18/18 0644 12/19/18 0202 12/20/18 0542  NA 136 138 138 139 140  K 4.8 4.5 4.6 4.2 4.0  CL 102 104 104 106 110  CO2 25 24 23 22  21*  GLUCOSE 192* 142* 108* 107* 118*  BUN 65* 62* 56* 51* 45*  CREATININE  1.97* 1.97* 2.04* 2.02* 1.99*  CALCIUM 8.2* 8.3* 8.7* 8.3* 8.2*   GFR: Estimated Creatinine Clearance: 31.7 mL/min (A) (by C-G formula based on SCr of 1.99 mg/dL (H)). Liver Function Tests: Recent Labs  Lab 12/16/18 1304 12/17/18 0039  AST 25 22  ALT 15 13  ALKPHOS 93 87  BILITOT 0.5 1.2  PROT 7.6 7.2  ALBUMIN 2.9* 2.8*   No results for input(s): LIPASE, AMYLASE in the last 168 hours. Recent Labs  Lab 12/16/18 1304  AMMONIA 20   Coagulation Profile: Recent Labs  Lab 12/16/18 1304  INR 1.3*   Cardiac Enzymes: No results for input(s): CKTOTAL, CKMB, CKMBINDEX, TROPONINI in the last 168 hours. BNP (last 3 results) No results for input(s): PROBNP in the last 8760 hours. HbA1C: No results for input(s): HGBA1C in the last 72 hours. CBG: Recent Labs  Lab 12/19/18 1559 12/19/18 2021 12/19/18 2340 12/20/18 0451 12/20/18 0839  GLUCAP 109* 110* 126* 120* 94   Lipid Profile: No results for input(s): CHOL, HDL, LDLCALC, TRIG, CHOLHDL, LDLDIRECT in the last 72 hours. Thyroid Function Tests: No results for input(s): TSH, T4TOTAL, FREET4, T3FREE, THYROIDAB in the last 72 hours. Anemia Panel: No results for input(s): VITAMINB12, FOLATE, FERRITIN, TIBC, IRON, RETICCTPCT in the last 72 hours. Sepsis Labs: No results for input(s): PROCALCITON, LATICACIDVEN in the last 168 hours.  Recent Results (from the past 240 hour(s))  SARS CORONAVIRUS 2 (TAT 6-24 HRS) Nasopharyngeal Nasopharyngeal Swab     Status: None   Collection Time: 12/16/18  2:15 PM   Specimen: Nasopharyngeal Swab  Result Value Ref Range Status   SARS Coronavirus 2 NEGATIVE NEGATIVE Final    Comment: (NOTE) SARS-CoV-2 target nucleic acids are NOT DETECTED. The SARS-CoV-2 RNA is generally detectable in upper and lower respiratory specimens during the acute phase of infection. Negative results do not preclude SARS-CoV-2 infection, do not rule out co-infections with other pathogens, and should not be used as the  sole basis for treatment or other patient management decisions. Negative results must be combined with clinical observations, patient history, and epidemiological information. The expected result is Negative. Fact Sheet for Patients: SugarRoll.be Fact Sheet for Healthcare Providers: https://www.woods-mathews.com/ This test is not yet approved or cleared by the [place] FDA and  has been authorized for detection and/or diagnosis of SARS-CoV-2 by FDA under an Emergency Use Authorization (EUA). This EUA will remain  in effect (meaning this test can be used) for the duration of the COVID-19 declaration under Section 56 4(b)(1) of the Act, 21 U.S.C. section 360bbb-3(b)(1), unless the authorization is terminated or revoked sooner. Performed at [facility], [place] [address], [place], [place] [address]   MRSA PCR Screening     Status:  None   Collection Time: 12/16/18  6:15 PM   Specimen: Nasal Mucosa; Nasopharyngeal  Result Value Ref Range Status   MRSA by PCR NEGATIVE NEGATIVE Final    Comment:        The GeneXpert MRSA Assay (FDA approved for NASAL specimens only), is one component of a comprehensive MRSA colonization surveillance program. It is not intended to diagnose MRSA infection nor to guide or monitor treatment for MRSA infections. Performed at [facility], [place] [address], [place], [place] [address]          Radiology Studies: No results found.      Scheduled Meds: . ammonium lactate  1 application Topical BID  . atorvastatin  40 mg Oral q1800  . carvedilol  3.125 mg Oral BID WC  . Chlorhexidine Gluconate Cloth  6 each Topical Daily  . feeding supplement (PRO-STAT SUGAR FREE 64)  30 mL Oral BID  . fluticasone  2 spray Each Nare Daily  . folic acid  1 mg Oral Daily  . insulin aspart  0-9 Units Subcutaneous Q4H  . lactulose  20 g Oral TID  . mirtazapine  30 mg Oral QHS  . multivitamin  with minerals  1 tablet Oral Daily  . pantoprazole  40 mg Intravenous Q12H  . sodium chloride flush  3 mL Intravenous Q12H  . sodium chloride flush  3 mL Intravenous Q12H  . spironolactone  12.5 mg Oral Daily  . torsemide  20 mg Oral Daily   Continuous Infusions: . sodium chloride Stopped (12/20/18 0943)  . sodium chloride       LOS: 4 days    Time spent: 35 mins.More than 50% of that time was spent in counseling and/or coordination of care.      Shelly Coss, MD [facility] Pager [phone]  If 7PM-7AM, please contact night-coverage [email or web address] Password [email or web address] 12/20/2018, 11:05 AM

## 2018-12-20 NOTE — Anesthesia Preprocedure Evaluation (Signed)
Anesthesia Evaluation  Patient identified by MRN, date of birth, ID band Patient awake    Reviewed: Allergy & Precautions, NPO status , Patient's Chart, lab work & pertinent test results  Airway Mallampati: II  TM Distance: >3 FB     Dental  (+) Dental Advisory Given   Pulmonary sleep apnea , COPD, Current Smoker,    breath sounds clear to auscultation       Cardiovascular hypertension, Pt. on home beta blockers and Pt. on medications + CAD, + Past MI and +CHF   Rhythm:Regular Rate:Normal     Neuro/Psych  Neuromuscular disease    GI/Hepatic PUD, GERD  ,(+) Hepatitis -, C  Endo/Other  diabetes  Renal/GU CRFRenal disease     Musculoskeletal   Abdominal   Peds  Hematology  (+) anemia ,   Anesthesia Other Findings   Reproductive/Obstetrics                             Lab Results  Component Value Date   WBC 5.3 12/20/2018   HGB 7.9 (L) 12/20/2018   HCT 26.8 (L) 12/20/2018   MCV 95.4 12/20/2018   PLT 118 (L) 12/20/2018   Lab Results  Component Value Date   CREATININE 1.99 (H) 12/20/2018   BUN 45 (H) 12/20/2018   NA 140 12/20/2018   K 4.0 12/20/2018   CL 110 12/20/2018   CO2 21 (L) 12/20/2018    Anesthesia Physical  Anesthesia Plan  ASA: III  Anesthesia Plan: MAC   Post-op Pain Management:    Induction: Intravenous  PONV Risk Score and Plan: 1 and Propofol infusion, Ondansetron and Treatment may vary due to age or medical condition  Airway Management Planned: Natural Airway and Nasal Cannula  Additional Equipment:   Intra-op Plan:   Post-operative Plan:   Informed Consent: I have reviewed the patients History and Physical, chart, labs and discussed the procedure including the risks, benefits and alternatives for the proposed anesthesia with the patient or authorized representative who has indicated his/her understanding and acceptance.     Dental advisory  given  Plan Discussed with: CRNA  Anesthesia Plan Comments:         Anesthesia Quick Evaluation

## 2018-12-20 NOTE — Anesthesia Postprocedure Evaluation (Signed)
Anesthesia Post Note  Patient: [name]  Procedure(s) Performed: ENTEROSCOPY (N/A ) HEMOSTASIS CLIP PLACEMENT SUBMUCOSAL INJECTION     Patient location during evaluation: PACU Anesthesia Type: MAC Level of consciousness: awake and alert Pain management: pain level controlled Vital Signs Assessment: post-procedure vital signs reviewed and stable Respiratory status: spontaneous breathing, nonlabored ventilation, respiratory function stable and patient connected to nasal cannula oxygen Cardiovascular status: stable and blood pressure returned to baseline Postop Assessment: no apparent nausea or vomiting Anesthetic complications: no    Last Vitals:  Vitals:   12/20/18 1000 12/20/18 1100  BP:    Pulse: 75 71  Resp: 20 (!) 0  Temp:    SpO2: 97% 97%    Last Pain:  Vitals:   12/20/18 0800  TempSrc: Oral  PainSc:                  [name]

## 2018-12-20 NOTE — Progress Notes (Signed)
Initial Nutrition Assessment  DOCUMENTATION CODES:   Obesity unspecified  INTERVENTION:  - will order Boost Breeze TID, each supplement provides 250 kcal and 9 grams of protein. - continue 30 mL Prostat BID, each supplement provides 100 kcal and 15 grams of protein. - continue to encourage PO intake and advance diet as medically feasible.   NUTRITION DIAGNOSIS:   Inadequate protein intake related to other (see comment)(current diet order) as evidenced by other (comment)(CLD does not meet estimated needs.).  GOAL:   Patient will meet greater than or equal to 90% of their needs  MONITOR:   PO intake, Supplement acceptance, Diet advancement, Labs, Weight trends  REASON FOR ASSESSMENT:   Other (Comment)(Pressure Injury report)  ASSESSMENT:   52 female with a past medical history of COPD with ongoing tobacco abuse, gastric ulcer, AVMs, DM, chronic back pain, CHF, iron deficiency anemia due to chronic blood loss, CHF, CAD, and chronic hepatitis C. She presented to the ED from nursing facility for 2 day hx of maroon-colored, bloody BMs. She was hospitalized 11/3-11/20 with GIB and underwent extensive GI evaluation at that time. She has been transfused with 4 units PRBCs and GI consulted. She underwent re-enteroscopy on 12/1 and no active bleeding noted.  Patient admitted on 11/29 and was NPO at that time. Diet advanced to CLD on 11/30 at 72, changed back to NPO on 12/1 at 1855, re-advanced to CLD on 12/2 at 2113, and then back to NPO today at 1017. No intakes documented while on CLD.   Patient sleeping when RD entered room but woke up to name call x1. Flow sheet documentation indicates that patient is a/o to self only. No family/visitors present. Patient was able to nod/shake head in response to questions. She indicates that she has no abdominal pain/pressure or nausea this AM. She does not think she had any liquids this AM. Unable to obtain any PTA information from patient.   Per  chart review, current weight is 199 lb and weight on date of admission was 204 lb. Weight on 11/20 was 230 lb which indicates 31 lb weight loss (13% body weight) in the past 2 weeks; question accuracy of this. Weight on 8/12 was 190 lb.   Per notes: - acute GIB, hemtochezia--FOBT positive - push enteroscopy on 12/1--noted 2 angiodysplastic lesions in jejunum - re-enteroscopy on 12/2--no findings of active bleeding - plan for balloon enteroscopy today (12/3) - stage 3 CKD - COPD with ongoing tobacco abuse - deconditioning/debility/confusion at baseline    Labs reviewed; CBGs: 120 and 94 mg/dl, BUN: 45 mg/dl, creatinine: 1.99 mg/dl, Ca: 8.2 mg/dl, GFR: 30 ml/min. Medications reviewed; 1 mg folvite/day, sliding scale novolog, 20 g lactulose TID, 40 mg IV protonix BID, 12.5 mg aldactone/day.      NUTRITION - FOCUSED PHYSICAL EXAM:  completed; no muscle and no fat wasting, no edema noted at this time.   Diet Order:   Diet Order            Diet NPO time specified  Diet effective now              EDUCATION NEEDS:   Not appropriate for education at this time  Skin:  Skin Assessment: Skin Integrity Issues: Skin Integrity Issues:: DTI, Stage II DTI: R heel Stage II: sacrum  Last BM:  12/3  Height:   Ht Readings from Last 1 Encounters:  12/19/18 5' 5"  (1.651 m)    Weight:   Wt Readings from Last 1 Encounters:  12/20/18 90.3  kg    Ideal Body Weight:  56.8 kg  BMI:  Body mass index is 33.13 kg/m.  Estimated Nutritional Needs:   Kcal:  1800-2000 kcal  Protein:  90-105 grams  Fluid:  >/= 1.8 L/day      Jarome Matin, MS, RD, LDN, [facility] Inpatient Clinical Dietitian Pager # [phone] After hours/weekend pager # [phone]

## 2018-12-21 ENCOUNTER — Encounter (HOSPITAL_COMMUNITY): Payer: Self-pay | Admitting: Gastroenterology

## 2018-12-21 LAB — BASIC METABOLIC PANEL
Anion gap: 10 (ref 5–15)
BUN: 37 mg/dL — ABNORMAL HIGH (ref 8–23)
CO2: 23 mmol/L (ref 22–32)
Calcium: 8.5 mg/dL — ABNORMAL LOW (ref 8.9–10.3)
Chloride: 107 mmol/L (ref 98–111)
Creatinine, Ser: 1.93 mg/dL — ABNORMAL HIGH (ref 0.44–1.00)
GFR calc Af Amer: 31 mL/min — ABNORMAL LOW (ref >60)
GFR calc non Af Amer: 27 mL/min — ABNORMAL LOW (ref >60)
Glucose, Bld: 94 mg/dL (ref 70–99)
Potassium: 3.8 mmol/L (ref 3.5–5.1)
Sodium: 140 mmol/L (ref 135–145)

## 2018-12-21 LAB — GLUCOSE, CAPILLARY
Glucose-Capillary: 140 mg/dL — ABNORMAL HIGH (ref 70–99)
Glucose-Capillary: 88 mg/dL (ref 70–99)
Glucose-Capillary: 83 mg/dL (ref 70–99)
Glucose-Capillary: 140 mg/dL — ABNORMAL HIGH (ref 70–99)
Glucose-Capillary: 153 mg/dL — ABNORMAL HIGH (ref 70–99)

## 2018-12-21 LAB — HEMOGLOBIN AND HEMATOCRIT, BLOOD
HCT: 25.6 % — ABNORMAL LOW (ref 36.0–46.0)
HCT: 25.8 % — ABNORMAL LOW (ref 36.0–46.0)
Hemoglobin: 7.6 g/dL — ABNORMAL LOW (ref 12.0–15.0)
Hemoglobin: 7.8 g/dL — ABNORMAL LOW (ref 12.0–15.0)

## 2018-12-21 NOTE — Progress Notes (Signed)
      Progress Note   Subjective  Patient underwent balloon enteroscopy with deep intubation of the small bowel yesterday, no active bleeding. Nursing states passed a very small amount of blood in the past stool, looked darker, old.    Objective   Vital signs in last 24 hours: Temp:  [97.6 F (36.4 C)-98 F (36.7 C)] 97.7 F (36.5 C) (12/04 0402) Pulse Rate:  [65-80] 66 (12/04 0637) Resp:  [13-25] 14 (12/04 0637) BP: (98-161)/(35-87) 118/41 (12/04 0637) SpO2:  [93 %-100 %] 99 % (12/04 0637) Weight:  [87 kg] 87 kg (12/04 0451) Last BM Date: 12/20/18 General:    AA female in NAD Heart:  Regular rate and rhythm; no murmurs Lungs: Respirations even and unlabored, lungs CTA bilaterally Abdomen:  Soft, nontender and nondistended.  Extremities:  Without edema. Neurologic:  Alert and oriented,  grossly normal neurologically. Psych:  Cooperative. Normal mood and affect.  Intake/Output from previous day: 12/03 0701 - 12/04 0700 In: 1202.5 [P.O.:860; I.V.:342.5] Out: 2200 [Urine:2200] Intake/Output this shift: No intake/output data recorded.  Lab Results: Recent Labs    12/19/18 1440 12/19/18 2252 12/20/18 0542 12/20/18 1202 12/20/18 2003 12/21/18 0426  WBC 6.4 5.0 5.3  --   --   --   HGB 8.5* 8.5* 7.9* 8.1* 7.9* 7.6*  HCT 28.1* 28.5* 26.8* 27.1* 26.5* 25.6*  PLT 118* 115* 118*  --   --   --    BMET Recent Labs    12/19/18 0202 12/20/18 0542 12/21/18 0426  NA 139 140 140  K 4.2 4.0 3.8  CL 106 110 107  CO2 22 21* 23  GLUCOSE 107* 118* 94  BUN 51* 45* 37*  CREATININE 2.02* 1.99* 1.93*  CALCIUM 8.3* 8.2* 8.5*   LFT No results for input(s): PROT, ALBUMIN, AST, ALT, ALKPHOS, BILITOT, BILIDIR, IBILI in the last 72 hours. PT/INR No results for input(s): LABPROT, INR in the last 72 hours.  Studies/Results: No results found.     Assessment / Plan:   62 y/o female with obscure overt bleeding suspected to be due to small bowel AVMs. She has had 3 enteroscopies  in the last 4 days - the first showing a bleeding AVM which was treated with APC, she had rebleeding which led to further endoscopic therapy using clips and epinephrine. A follow up balloon enteroscopy showed no active bleeding at the treated sites, no blood in the lumen, and distal to the treated lesions by another 100cm there was no concerning pathology.   I think she passed some older blood last night, Hgb has drifted down a bit but BUN has been downtrending as well. We will monitor her closely today for recurrent bleeding. If she has continued intermittent blood loss will perform a capsule study to clarify if anything further in the distal small bowel is contributing. If she has significant active bleeding could do a nuclear medicine tagged RBC scan. Other things to consider would be a Meckel scan or repeat colonoscopy at some point if bleeding persists.   Keep on full liquid diet today in case the patient needs a bowel prep for capsule at some point pending her course in the next 24 hours. Please contact me with recurrence of bleeding.   Millerton Cellar, MD [facility] Gastroenterology

## 2018-12-21 NOTE — Progress Notes (Signed)
PROGRESS NOTE    [name]  MLJ:[id number] DOB: 1955/05/29 DOA: 12/16/2018 PCP: [facility]   Brief Narrative:  Patient is a 40 female with a past medical history of COPD with ongoing tobacco abuse, history of gastric ulcer, AVMs, diabetes mellitus, chronic back pain, CHF, iron deficiency anemia due to chronic blood loss, chronic systolic heart failure, history of coronary artery disease, chronic hepatitis C ,who presents from nursing facility for the evaluation of 2-day history of maroon-colored bloody bowel movements.  She was recently hospitalized here from 11/3-11/20 for GI bleed.  At that time she underwent extensive GI evaluation.  When she presented here this time, her hemoglobin was 4.8.    She has been transfused with PRBCs.  Total of 4 units.  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  APC applied . She was having maroon-colored stools after the procedure.  So she underwent  reenteroscopy on 12/19/18.  Not found to have active bleeding.  She again had maroon-colored liquidy stools last nighon 12/19/18 night .  She underwent  balloon enteroscopy with no finding of active bleeding source.  Hemoglobin in the range of 7 this morning.  Plan is to continue to monitor and possible capsule endoscopy if she bleeds further.  Assessment & Plan:   Principal Problem:   GI bleed Active Problems:   Chronic systolic heart failure (HCC)   Diabetes (HCC)   COPD (chronic obstructive pulmonary disease) (HCC)   Symptomatic anemia   Upper GI bleed   B12 deficiency   Iron deficiency anemia due to chronic blood loss   Folate deficiency   Anxiety   CAD (coronary artery disease)   Chronic back pain   Depression   Diabetes mellitus type 2, uncontrolled (HCC)   GERD (gastroesophageal reflux disease)   Hyperlipidemia   Hypertension   Obesity   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HCC)   Gastritis and gastroduodenitis   AVM  (arteriovenous malformation) of small bowel, acquired with hemorrhage   Acute GI bleed/hematochezia: C  She presented with maroon-colored stools for 2 days from skilled nursing facility.  FOBT positive.  Hemoglobin was 4.8 on admission.  Recently hospitalized this month and underwent GI evaluation with finding of AVM as noted on capsule endoscopy.  She underwent studies with colonoscopy, enteroscopy, capsule study, tagged RBC scan. underwent transfusion with 5 unit of PRBC on last admission.  When she was discharged home her hemoglobin was in the range of 10.  She also has history of gastritis/gastroenteritis. On this admission, she has been transfused with 4 units of PRBC.  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  APC applied . She was having maroon-colored stools after the procedure.  So she underwent  reenteroscopy on 12/19/18.  Not found to have active bleeding.  She again had maroon-colored liquidy stools last nighon 12/19/18 night . She underwent  balloon enteroscopy with no finding of active bleeding source.  Hemoglobin in the range of 7 this morning.  Plan is to continue to monitor and possible capsule endoscopy if she bleeds further.  Chronic systolic heart failure/restrictive  cardiomyopathy/coronary artery disease: Currently stable.  Denies any chest pain or shortness of breath.  Has trace edema on bilateral lower extremities.  Continue patient's home regimen of Coreg, Aldactone, Demadex, Lipitor.  Monitor input/output  CKD stage 3:Baseline Creatinine fluctuates from 1.5-2.5.  Currently at baseline.  COPD with ongoing tobacco abuse:  On Dulera, Spiriva.  Continue bronchodilators as needed.  History of hepatic encephalopathy: Noted on prior hospitalization.  History of hepatitis C/cirrhosis.  On lactulose.  Ammonia level is normal.Has mild thrombocytopenia due to liver cirrhosis  GERD: Continue PPI  Hypertension:  Currently blood pressure stable.  Continue Coreg, spironolactone, Demadex  Diabetes type 2: Well controlled.  Hemoglobin A1c of 5.9 on 06/21/2018.  Continue sliding-scale insulin  Folic acid deficiency: Continue folate.  Debility/deconditioning/confusion at baseline:  Nursing home resident.  She is alert, awake but oriented to place and person only.  She states she ambulates with the help of walker.  Confused on baseline.  History of hepatic encephalopathy but ammonia is normal.  Continue supportive care.  Pressure Injury 12/16/18 Sacrum Mid Stage II -  Partial thickness loss of dermis presenting as a shallow open ulcer with a red, pink wound bed without slough. (Active)  12/16/18 1749  Location: Sacrum  Location Orientation: Mid  Staging: Stage II -  Partial thickness loss of dermis presenting as a shallow open ulcer with a red, pink wound bed without slough.  Wound Description (Comments):   Present on Admission: Yes     Pressure Injury 12/16/18 Heel Right Deep Tissue Injury - Purple or maroon localized area of discolored intact skin or blood-filled blister due to damage of underlying soft tissue from pressure and/or shear. (Active)  12/16/18   Location: Heel  Location Orientation: Right  Staging: Deep Tissue Injury - Purple or maroon localized area of discolored intact skin or blood-filled blister due to damage of underlying soft tissue from pressure and/or shear.  Wound Description (Comments):   Present on Admission: Yes       Nutrition Problem: Inadequate protein intake Etiology: other (see comment)(current diet order)      DVT prophylaxis:SCD Code Status: Full Family Communication: Called spouse on phone multiple times,connection unsuccessful Disposition Plan: Back to skilled nursing facility when H and H remains stable and she remains free of bloody bowel movements.Needs GI clearance   Consultants: GI  Procedures: None  Antimicrobials:  Anti-infectives (From admission,  onward)   None      Subjective: Patient seen and examined the bedside this morning.  Hemodynamically stable.  She had a bowel movement last night which was less bloody.  Patient denies any complaints.  Objective: Vitals:   12/21/18 0800 12/21/18 0918 12/21/18 1200 12/21/18 1205  BP: (!) 109/41 (!) 126/42  (!) 123/43  Pulse: 69 68  78  Resp: (!) 8 14  18   Temp: 97.9 F (36.6 C)  98.3 F (36.8 C)   TempSrc: Oral  Oral   SpO2: 98% 100%  98%  Weight:      Height:        Intake/Output Summary (Last 24 hours) at 12/21/2018 1236 Last data filed at 12/21/2018 1000 Gross per 24 hour  Intake 1682.51 ml  Output 1800 ml  Net -117.49 ml   Filed Weights   12/19/18 0757 12/20/18 0500 12/21/18 0451  Weight: 90.3 kg 90.3 kg 87 kg    Examination:  General exam: Not in distress, deconditioned, debilitated HEENT:PERRL, Ear/Nose normal on gross exam Respiratory system: Bilateral equal air entry, normal vesicular breath sounds, no wheezes or crackles  Cardiovascular system: S1 & S2 heard, RRR. No JVD, murmurs, rubs, gallops or clicks. Trace pedal edema. Gastrointestinal system: Abdomen is nondistended, soft and nontender. No organomegaly or masses felt. Normal bowel sounds heard. Central nervous system: Alert and awake.oriented x2  Extremities:Trace pedal  edema, no clubbing ,no  cyanosis, amputation of left great toe  skin: No rashes, lesions ,no icterus ,no pallor,stage 2 sacral ulcer Psychiatry: Judgement and insight appear impaired   Data Reviewed: I have personally reviewed following labs and imaging studies  CBC: Recent Labs  Lab 12/19/18 0545 12/19/18 1048 12/19/18 1440 12/19/18 2252 12/20/18 0542 12/20/18 1202 12/20/18 2003 12/21/18 0426  WBC 5.4 7.0 6.4 5.0 5.3  --   --   --   NEUTROABS 3.7 5.6 4.7 3.5 3.7  --   --   --   HGB 7.9* 8.6* 8.5* 8.5* 7.9* 8.1* 7.9* 7.6*  HCT 25.8* 28.9* 28.1* 28.5* 26.8* 27.1* 26.5* 25.6*  MCV 92.5 94.4 94.9 94.4 95.4  --   --   --   PLT  118* 113* 118* 115* 118*  --   --   --    Basic Metabolic Panel: Recent Labs  Lab 12/17/18 0039 12/18/18 0644 12/19/18 0202 12/20/18 0542 12/21/18 0426  NA 138 138 139 140 140  K 4.5 4.6 4.2 4.0 3.8  CL 104 104 106 110 107  CO2 24 23 22  21* 23  GLUCOSE 142* 108* 107* 118* 94  BUN 62* 56* 51* 45* 37*  CREATININE 1.97* 2.04* 2.02* 1.99* 1.93*  CALCIUM 8.3* 8.7* 8.3* 8.2* 8.5*   GFR: Estimated Creatinine Clearance: 32.1 mL/min (A) (by C-G formula based on SCr of 1.93 mg/dL (H)). Liver Function Tests: Recent Labs  Lab 12/16/18 1304 12/17/18 0039  AST 25 22  ALT 15 13  ALKPHOS 93 87  BILITOT 0.5 1.2  PROT 7.6 7.2  ALBUMIN 2.9* 2.8*   No results for input(s): LIPASE, AMYLASE in the last 168 hours. Recent Labs  Lab 12/16/18 1304  AMMONIA 20   Coagulation Profile: Recent Labs  Lab 12/16/18 1304  INR 1.3*   Cardiac Enzymes: No results for input(s): CKTOTAL, CKMB, CKMBINDEX, TROPONINI in the last 168 hours. BNP (last 3 results) No results for input(s): PROBNP in the last 8760 hours. HbA1C: No results for input(s): HGBA1C in the last 72 hours. CBG: Recent Labs  Lab 12/20/18 1742 12/20/18 2213 12/21/18 0104 12/21/18 0448 12/21/18 1128  GLUCAP 92 182* 140* 88 140*   Lipid Profile: No results for input(s): CHOL, HDL, LDLCALC, TRIG, CHOLHDL, LDLDIRECT in the last 72 hours. Thyroid Function Tests: No results for input(s): TSH, T4TOTAL, FREET4, T3FREE, THYROIDAB in the last 72 hours. Anemia Panel: No results for input(s): VITAMINB12, FOLATE, FERRITIN, TIBC, IRON, RETICCTPCT in the last 72 hours. Sepsis Labs: No results for input(s): PROCALCITON, LATICACIDVEN in the last 168 hours.  Recent Results (from the past 240 hour(s))  SARS CORONAVIRUS 2 (TAT 6-24 HRS) Nasopharyngeal Nasopharyngeal Swab     Status: None   Collection Time: 12/16/18  2:15 PM   Specimen: Nasopharyngeal Swab  Result Value Ref Range Status   SARS Coronavirus 2 NEGATIVE NEGATIVE Final     Comment: (NOTE) SARS-CoV-2 target nucleic acids are NOT DETECTED. The SARS-CoV-2 RNA is generally detectable in upper and lower respiratory specimens during the acute phase of infection. Negative results do not preclude SARS-CoV-2 infection, do not rule out co-infections with other pathogens, and should not be used as the sole basis for treatment or other patient management decisions. Negative results must be combined with clinical observations, patient history, and epidemiological information. The expected result is Negative. Fact Sheet for Patients: SugarRoll.be Fact Sheet for Healthcare Providers: https://www.woods-mathews.com/ This test is not yet approved or cleared by the [place] FDA and  has been authorized for detection  and/or diagnosis of SARS-CoV-2 by FDA under an Emergency Use Authorization (EUA). This EUA will remain  in effect (meaning this test can be used) for the duration of the COVID-19 declaration under Section 56 4(b)(1) of the Act, 21 U.S.C. section 360bbb-3(b)(1), unless the authorization is terminated or revoked sooner. Performed at [facility], [place] [address] [place], [place] [address]   MRSA PCR Screening     Status: None   Collection Time: 12/16/18  6:15 PM   Specimen: Nasal Mucosa; Nasopharyngeal  Result Value Ref Range Status   MRSA by PCR NEGATIVE NEGATIVE Final    Comment:        The GeneXpert MRSA Assay (FDA approved for NASAL specimens only), is one component of a comprehensive MRSA colonization surveillance program. It is not intended to diagnose MRSA infection nor to guide or monitor treatment for MRSA infections. Performed at [facility], [place] [address] [place], [place] [address]          Radiology Studies: No results found.      Scheduled Meds: . ammonium lactate  1 application Topical BID  . atorvastatin  40 mg Oral q1800  . carvedilol  3.125 mg Oral  BID WC  . Chlorhexidine Gluconate Cloth  6 each Topical Daily  . feeding supplement  1 Container Oral TID BM  . feeding supplement (PRO-STAT SUGAR FREE 64)  30 mL Oral BID  . fluticasone  2 spray Each Nare Daily  . folic acid  1 mg Oral Daily  . insulin aspart  0-9 Units Subcutaneous Q4H  . lactulose  20 g Oral TID  . mirtazapine  30 mg Oral QHS  . multivitamin with minerals  1 tablet Oral Daily  . pantoprazole  40 mg Intravenous Q12H  . sodium chloride flush  3 mL Intravenous Q12H  . sodium chloride flush  3 mL Intravenous Q12H  . spironolactone  12.5 mg Oral Daily  . torsemide  20 mg Oral Daily   Continuous Infusions: . sodium chloride Stopped (12/20/18 0943)  . sodium chloride       LOS: 5 days    Time spent: 35 mins.More than 50% of that time was spent in counseling and/or coordination of care.      Shelly Coss, MD [facility] Pager [phone]  If 7PM-7AM, please contact night-coverage [email or web address] Password [facility] 12/21/2018, 12:36 PM

## 2018-12-22 LAB — GLUCOSE, CAPILLARY
Glucose-Capillary: 101 mg/dL — ABNORMAL HIGH (ref 70–99)
Glucose-Capillary: 101 mg/dL — ABNORMAL HIGH (ref 70–99)
Glucose-Capillary: 103 mg/dL — ABNORMAL HIGH (ref 70–99)
Glucose-Capillary: 129 mg/dL — ABNORMAL HIGH (ref 70–99)
Glucose-Capillary: 131 mg/dL — ABNORMAL HIGH (ref 70–99)
Glucose-Capillary: 147 mg/dL — ABNORMAL HIGH (ref 70–99)
Glucose-Capillary: 174 mg/dL — ABNORMAL HIGH (ref 70–99)
Glucose-Capillary: 193 mg/dL — ABNORMAL HIGH (ref 70–99)

## 2018-12-22 LAB — HEMOGLOBIN AND HEMATOCRIT, BLOOD
HCT: 26.2 % — ABNORMAL LOW (ref 36.0–46.0)
Hemoglobin: 7.6 g/dL — ABNORMAL LOW (ref 12.0–15.0)

## 2018-12-22 MED ORDER — PANTOPRAZOLE SODIUM 40 MG PO TBEC
40.0000 mg | DELAYED_RELEASE_TABLET | Freq: Two times a day (BID) | ORAL | Status: DC
  Administered 2018-12-22 – 2019-01-02 (×21): 40 mg via ORAL
  Filled 2018-12-22 (×21): qty 1

## 2018-12-22 NOTE — Progress Notes (Signed)
PROGRESS NOTE    [name]  YWV:[id number] DOB: [birth year] DOA: 12/16/2018 PCP: [facility]   Brief Narrative:  Patient is a 53 female with a past medical history of COPD with ongoing tobacco abuse, history of gastric ulcer, AVMs, diabetes mellitus, chronic back pain, CHF, iron deficiency anemia due to chronic blood loss, chronic systolic heart failure, history of coronary artery disease, chronic hepatitis C ,who presents from nursing facility for the evaluation of 2-day history of maroon-colored bloody bowel movements.  She was recently hospitalized here from 11/3-11/20 for GI bleed.  At that time she underwent extensive GI evaluation.  When she presented here this time, her hemoglobin was 4.8.    She has been transfused with PRBCs.  Total of 4 units.  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  APC applied . She was having maroon-colored stools after the procedure.  So she underwent  reenteroscopy on 12/19/18.  Not found to have active bleeding.  She again had maroon-colored liquidy stools on 12/19/18 night .  She underwent  balloon enteroscopy with no finding of active bleeding source.  Hemoglobin in the range of 7 this morning.  Last night she had a bowel movement and it was brown in color.  No blood .Plan is to continue to monitor and possible capsule endoscopy if she bleeds further.  We will discharge her to skilled nursing facility after GI clearance.  Assessment & Plan:   Principal Problem:   GI bleed Active Problems:   Chronic systolic heart failure (HCC)   Diabetes (HCC)   COPD (chronic obstructive pulmonary disease) (HCC)   Symptomatic anemia   Upper GI bleed   B12 deficiency   Iron deficiency anemia due to chronic blood loss   Folate deficiency   Anxiety   CAD (coronary artery disease)   Chronic back pain   Depression   Diabetes mellitus type 2, uncontrolled (HCC)   GERD (gastroesophageal  reflux disease)   Hyperlipidemia   Hypertension   Obesity   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HCC)   Gastritis and gastroduodenitis   AVM (arteriovenous malformation) of small bowel, acquired with hemorrhage   Acute GI bleed/hematochezia: C  She presented with maroon-colored stools for 2 days from skilled nursing facility.  FOBT positive.  Hemoglobin was 4.8 on admission.  Recently hospitalized this month and underwent GI evaluation with finding of AVM as noted on capsule endoscopy.  She underwent studies with colonoscopy, enteroscopy, capsule study, tagged RBC scan. underwent transfusion with 5 unit of PRBC on last admission.  When she was discharged home her hemoglobin was in the range of 10.  She also has history of gastritis/gastroenteritis. On this admission, she has been transfused with 4 units of PRBC.  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  APC applied . She was having maroon-colored stools after the procedure.  So she underwent  reenteroscopy on 12/19/18.  Not found to have active bleeding.  She again had maroon-colored liquidy stools on 12/19/18 night . She underwent  balloon enteroscopy with no finding of active bleeding source.  Last night she had a bowel movement and it was brown in color.  No blood .Plan is to continue to monitor and possible capsule endoscopy if she bleeds further.  We will discharge her to skilled nursing facility after GI clearance.  Chronic systolic heart failure/restrictive  cardiomyopathy/coronary artery disease: Currently stable.  Denies any  chest pain or shortness of breath.  Has trace edema on bilateral lower extremities.  Continue patient's home regimen of Coreg, Aldactone, Demadex, Lipitor.  Monitor input/output  CKD stage 3:Baseline Creatinine fluctuates from 1.5-2.5.  Currently at baseline.  COPD with ongoing tobacco abuse: On Dulera, Spiriva.  Continue bronchodilators as needed.   History of hepatic encephalopathy: Noted on prior hospitalization.  History of hepatitis C/cirrhosis.  On lactulose.  Ammonia level is normal.Has mild thrombocytopenia due to liver cirrhosis  GERD: Continue PPI  Hypertension: Currently blood pressure stable.  Continue Coreg, spironolactone, Demadex  Diabetes type 2: Well controlled.  Hemoglobin A1c of 5.9 on 06/21/2018.  Continue sliding-scale insulin  Folic acid deficiency: Continue folate.  Debility/deconditioning/confusion at baseline:  Nursing home resident.  She is alert, awake but oriented to place and person only.  She states she ambulates with the help of walker.  Confused on baseline.  History of hepatic encephalopathy but ammonia is normal.  Continue supportive care.  Pressure Injury 12/16/18 Sacrum Mid Stage II -  Partial thickness loss of dermis presenting as a shallow open ulcer with a red, pink wound bed without slough. (Active)  12/16/18 1749  Location: Sacrum  Location Orientation: Mid  Staging: Stage II -  Partial thickness loss of dermis presenting as a shallow open ulcer with a red, pink wound bed without slough.  Wound Description (Comments):   Present on Admission: Yes     Pressure Injury 12/16/18 Heel Right Deep Tissue Injury - Purple or maroon localized area of discolored intact skin or blood-filled blister due to damage of underlying soft tissue from pressure and/or shear. (Active)  12/16/18   Location: Heel  Location Orientation: Right  Staging: Deep Tissue Injury - Purple or maroon localized area of discolored intact skin or blood-filled blister due to damage of underlying soft tissue from pressure and/or shear.  Wound Description (Comments):   Present on Admission: Yes       Nutrition Problem: Inadequate protein intake Etiology: other (see comment)(current diet order)      DVT prophylaxis:SCD Code Status: Full Family Communication: Called spouse on phone multiple times,connection unsuccessful  Disposition Plan: Back to skilled nursing facility when H and H remains stable and she remains free of bloody bowel movements.Needs GI clearance   Consultants: GI  Procedures: None  Antimicrobials:  Anti-infectives (From admission, onward)   None      Subjective: Patient seen and examined at bedside this morning.  Hemodynamically stable.  No active issues.  She had a bowel movement last night and it was brown in color.  Patient denies any complaints otherwise.  Objective: Vitals:   12/22/18 0500 12/22/18 0600 12/22/18 0800 12/22/18 1000  BP: (!) 113/45 (!) 115/44 (!) 112/46 (!) 109/51  Pulse: 66 70 69 66  Resp: 19 15    Temp: 98.3 F (36.8 C)  97.8 F (36.6 C)   TempSrc: Axillary  Oral   SpO2: 98% 97% 97% 93%  Weight:      Height:        Intake/Output Summary (Last 24 hours) at 12/22/2018 1246 Last data filed at 12/22/2018 1004 Gross per 24 hour  Intake 506 ml  Output 2300 ml  Net -1794 ml   Filed Weights   12/20/18 0500 12/21/18 0451 12/22/18 0447  Weight: 90.3 kg 87 kg 85.2 kg    Examination:  General exam: Not in distress, deconditioned, debilitated HEENT:PERRL, Ear/Nose normal on gross exam Respiratory system: Bilateral equal air entry, normal vesicular breath sounds, no wheezes  or crackles  Cardiovascular system: S1 & S2 heard, RRR. No JVD, murmurs, rubs, gallops or clicks. Trace pedal edema. Gastrointestinal system: Abdomen is nondistended, soft and nontender. No organomegaly or masses felt. Normal bowel sounds heard. Central nervous system: Alert and awake.oriented x2  Extremities:Trace pedal  edema, no clubbing ,no cyanosis, amputation of left great toe  skin: No rashes, lesions ,no icterus ,no pallor,stage 2 sacral ulcer Psychiatry: Judgement and insight appear impaired   Data Reviewed: I have personally reviewed following labs and imaging studies  CBC: Recent Labs  Lab 12/19/18 0545 12/19/18 1048 12/19/18 1440 12/19/18 2252 12/20/18 0542  12/20/18 1202 12/20/18 2003 12/21/18 0426 12/21/18 1642 12/22/18 0540  WBC 5.4 7.0 6.4 5.0 5.3  --   --   --   --   --   NEUTROABS 3.7 5.6 4.7 3.5 3.7  --   --   --   --   --   HGB 7.9* 8.6* 8.5* 8.5* 7.9* 8.1* 7.9* 7.6* 7.8* 7.6*  HCT 25.8* 28.9* 28.1* 28.5* 26.8* 27.1* 26.5* 25.6* 25.8* 26.2*  MCV 92.5 94.4 94.9 94.4 95.4  --   --   --   --   --   PLT 118* 113* 118* 115* 118*  --   --   --   --   --    Basic Metabolic Panel: Recent Labs  Lab 12/17/18 0039 12/18/18 0644 12/19/18 0202 12/20/18 0542 12/21/18 0426  NA 138 138 139 140 140  K 4.5 4.6 4.2 4.0 3.8  CL 104 104 106 110 107  CO2 24 23 22  21* 23  GLUCOSE 142* 108* 107* 118* 94  BUN 62* 56* 51* 45* 37*  CREATININE 1.97* 2.04* 2.02* 1.99* 1.93*  CALCIUM 8.3* 8.7* 8.3* 8.2* 8.5*   GFR: Estimated Creatinine Clearance: 31.8 mL/min (A) (by C-G formula based on SCr of 1.93 mg/dL (H)). Liver Function Tests: Recent Labs  Lab 12/16/18 1304 12/17/18 0039  AST 25 22  ALT 15 13  ALKPHOS 93 87  BILITOT 0.5 1.2  PROT 7.6 7.2  ALBUMIN 2.9* 2.8*   No results for input(s): LIPASE, AMYLASE in the last 168 hours. Recent Labs  Lab 12/16/18 1304  AMMONIA 20   Coagulation Profile: Recent Labs  Lab 12/16/18 1304  INR 1.3*   Cardiac Enzymes: No results for input(s): CKTOTAL, CKMB, CKMBINDEX, TROPONINI in the last 168 hours. BNP (last 3 results) No results for input(s): PROBNP in the last 8760 hours. HbA1C: No results for input(s): HGBA1C in the last 72 hours. CBG: Recent Labs  Lab 12/21/18 1128 12/21/18 1527 12/21/18 2043 12/22/18 0014 12/22/18 0445  GLUCAP 140* 153* 131* 147* 103*   Lipid Profile: No results for input(s): CHOL, HDL, LDLCALC, TRIG, CHOLHDL, LDLDIRECT in the last 72 hours. Thyroid Function Tests: No results for input(s): TSH, T4TOTAL, FREET4, T3FREE, THYROIDAB in the last 72 hours. Anemia Panel: No results for input(s): VITAMINB12, FOLATE, FERRITIN, TIBC, IRON, RETICCTPCT in the last 72  hours. Sepsis Labs: No results for input(s): PROCALCITON, LATICACIDVEN in the last 168 hours.  Recent Results (from the past 240 hour(s))  SARS CORONAVIRUS 2 (TAT 6-24 HRS) Nasopharyngeal Nasopharyngeal Swab     Status: None   Collection Time: 12/16/18  2:15 PM   Specimen: Nasopharyngeal Swab  Result Value Ref Range Status   SARS Coronavirus 2 NEGATIVE NEGATIVE Final    Comment: (NOTE) SARS-CoV-2 target nucleic acids are NOT DETECTED. The SARS-CoV-2 RNA is generally detectable in upper and lower respiratory specimens during the  acute phase of infection. Negative results do not preclude SARS-CoV-2 infection, do not rule out co-infections with other pathogens, and should not be used as the sole basis for treatment or other patient management decisions. Negative results must be combined with clinical observations, patient history, and epidemiological information. The expected result is Negative. Fact Sheet for Patients: SugarRoll.be Fact Sheet for Healthcare Providers: https://www.woods-mathews.com/ This test is not yet approved or cleared by the [place] FDA and  has been authorized for detection and/or diagnosis of SARS-CoV-2 by FDA under an Emergency Use Authorization (EUA). This EUA will remain  in effect (meaning this test can be used) for the duration of the COVID-19 declaration under Section 56 4(b)(1) of the Act, 21 U.S.C. section 360bbb-3(b)(1), unless the authorization is terminated or revoked sooner. Performed at [facility], [place] [address] [place], [place] [address]   MRSA PCR Screening     Status: None   Collection Time: 12/16/18  6:15 PM   Specimen: Nasal Mucosa; Nasopharyngeal  Result Value Ref Range Status   MRSA by PCR NEGATIVE NEGATIVE Final    Comment:        The GeneXpert MRSA Assay (FDA approved for NASAL specimens only), is one component of a comprehensive MRSA colonization surveillance program. It is  not intended to diagnose MRSA infection nor to guide or monitor treatment for MRSA infections. Performed at [facility], [place] [address], [place], [place] [address]          Radiology Studies: No results found.      Scheduled Meds: . ammonium lactate  1 application Topical BID  . atorvastatin  40 mg Oral q1800  . carvedilol  3.125 mg Oral BID WC  . Chlorhexidine Gluconate Cloth  6 each Topical Daily  . feeding supplement  1 Container Oral TID BM  . feeding supplement (PRO-STAT SUGAR FREE 64)  30 mL Oral BID  . fluticasone  2 spray Each Nare Daily  . folic acid  1 mg Oral Daily  . insulin aspart  0-9 Units Subcutaneous Q4H  . lactulose  20 g Oral TID  . mirtazapine  30 mg Oral QHS  . multivitamin with minerals  1 tablet Oral Daily  . pantoprazole  40 mg Oral BID  . sodium chloride flush  3 mL Intravenous Q12H  . sodium chloride flush  3 mL Intravenous Q12H  . spironolactone  12.5 mg Oral Daily  . torsemide  20 mg Oral Daily   Continuous Infusions: . sodium chloride Stopped (12/20/18 0943)  . sodium chloride       LOS: 6 days    Time spent: 35 mins.More than 50% of that time was spent in counseling and/or coordination of care.      Shelly Coss, MD [facility] Pager [phone]  If 7PM-7AM, please contact night-coverage [email or web address] Password [email or web address] 12/22/2018, 12:46 PM

## 2018-12-22 NOTE — Progress Notes (Signed)
     [facility] Progress Note  CC:  GIB  Subjective:  Hgb stable this AM at 7.6 grams.  Per her nurse, no sign of bleeding.  Pharmacy asked if we could switch her PPI to PO.  Objective:  Vital signs in last 24 hours: Temp:  [97.8 F (36.6 C)-98.3 F (36.8 C)] 97.8 F (36.6 C) (12/05 0800) Pulse Rate:  [66-78] 70 (12/05 0600) Resp:  [14-21] 15 (12/05 0600) BP: (92-142)/(34-76) 115/44 (12/05 0600) SpO2:  [96 %-100 %] 97 % (12/05 0600) Weight:  [85.2 kg] 85.2 kg (12/05 0447) Last BM Date: 12/21/18 General:  Alert, Well-developed, in NAD Heart:  Regular rate and rhythm; no murmurs Pulm:  CTAB.  No increased WOB. Abdomen:  Soft, non-distended.  BS present.  Non-tender.  Intake/Output from previous day: 12/04 0701 - 12/05 0700 In: 483 [P.O.:480; I.V.:3] Out: 2550 [Urine:2550] Intake/Output this shift: Total I/O In: 3 [I.V.:3] Out: -   Lab Results: Recent Labs    12/19/18 1440 12/19/18 2252 12/20/18 0542  12/21/18 0426 12/21/18 1642 12/22/18 0540  WBC 6.4 5.0 5.3  --   --   --   --   HGB 8.5* 8.5* 7.9*   < > 7.6* 7.8* 7.6*  HCT 28.1* 28.5* 26.8*   < > 25.6* 25.8* 26.2*  PLT 118* 115* 118*  --   --   --   --    < > = values in this interval not displayed.   BMET Recent Labs    12/20/18 0542 12/21/18 0426  NA 140 140  K 4.0 3.8  CL 110 107  CO2 21* 23  GLUCOSE 118* 94  BUN 45* 37*  CREATININE 1.99* 1.93*  CALCIUM 8.2* 8.5*   Assessment / Plan: 63 y/o female with obscure overt bleeding suspected to be due to small bowel AVMs. She has had 3 enteroscopies in the last 5 days - the first showing a bleeding AVM which was treated with APC, she had rebleeding which led to further endoscopic therapy using clips and epinephrine. A follow up balloon enteroscopy on 12/3 showed no active bleeding at the treated sites, no blood in the lumen, and distal to the treated lesions by another 100cm there was no concerning pathology.   If she has continued  intermittent blood loss will perform a capsule study to clarify if anything further in the distal small bowel is contributing. If she has significant active bleeding could do a nuclear medicine tagged RBC scan. Other things to consider would be a Meckel scan or repeat colonoscopy at some point if bleeding persists.   OK to advance diet and plan to discharge in the morning if Hgb stable and no sign of bleeding.   LOS: 6 days   Laban Emperor. Ivey Nembhard  12/22/2018, 10:06 AM

## 2018-12-23 LAB — CBC WITH DIFFERENTIAL/PLATELET
Abs Immature Granulocytes: 0.01 10*3/uL (ref 0.00–0.07)
Basophils Absolute: 0 10*3/uL (ref 0.0–0.1)
Basophils Relative: 1 %
Eosinophils Absolute: 0.3 10*3/uL (ref 0.0–0.5)
Eosinophils Relative: 6 %
HCT: 26.5 % — ABNORMAL LOW (ref 36.0–46.0)
Hemoglobin: 8.1 g/dL — ABNORMAL LOW (ref 12.0–15.0)
Immature Granulocytes: 0 %
Lymphocytes Relative: 14 %
Lymphs Abs: 0.9 10*3/uL (ref 0.7–4.0)
MCH: 28.4 pg (ref 26.0–34.0)
MCHC: 30.6 g/dL (ref 30.0–36.0)
MCV: 93 fL (ref 80.0–100.0)
Monocytes Absolute: 0.6 10*3/uL (ref 0.1–1.0)
Monocytes Relative: 10 %
Neutro Abs: 4.3 10*3/uL (ref 1.7–7.7)
Neutrophils Relative %: 69 %
Platelets: 124 10*3/uL — ABNORMAL LOW (ref 150–400)
RBC: 2.85 MIL/uL — ABNORMAL LOW (ref 3.87–5.11)
RDW: 16.5 % — ABNORMAL HIGH (ref 11.5–15.5)
WBC: 6.2 10*3/uL (ref 4.0–10.5)
nRBC: 0 % (ref 0.0–0.2)

## 2018-12-23 LAB — BASIC METABOLIC PANEL
Anion gap: 14 (ref 5–15)
BUN: 29 mg/dL — ABNORMAL HIGH (ref 8–23)
CO2: 25 mmol/L (ref 22–32)
Calcium: 8.4 mg/dL — ABNORMAL LOW (ref 8.9–10.3)
Chloride: 101 mmol/L (ref 98–111)
Creatinine, Ser: 1.98 mg/dL — ABNORMAL HIGH (ref 0.44–1.00)
GFR calc Af Amer: 30 mL/min — ABNORMAL LOW (ref >60)
GFR calc non Af Amer: 26 mL/min — ABNORMAL LOW (ref >60)
Glucose, Bld: 114 mg/dL — ABNORMAL HIGH (ref 70–99)
Potassium: 3.6 mmol/L (ref 3.5–5.1)
Sodium: 140 mmol/L (ref 135–145)

## 2018-12-23 LAB — GLUCOSE, CAPILLARY
Glucose-Capillary: 119 mg/dL — ABNORMAL HIGH (ref 70–99)
Glucose-Capillary: 102 mg/dL — ABNORMAL HIGH (ref 70–99)
Glucose-Capillary: 195 mg/dL — ABNORMAL HIGH (ref 70–99)
Glucose-Capillary: 130 mg/dL — ABNORMAL HIGH (ref 70–99)
Glucose-Capillary: 142 mg/dL — ABNORMAL HIGH (ref 70–99)

## 2018-12-23 LAB — SARS CORONAVIRUS 2 (TAT 6-24 HRS): SARS Coronavirus 2: NEGATIVE

## 2018-12-23 NOTE — Progress Notes (Signed)
PROGRESS NOTE    [name]  ZHG:[id number] DOB: Oct 18, 1955 DOA: 12/16/2018 PCP: [place]   Brief Narrative:  Patient is a 34 female with a past medical history of COPD with ongoing tobacco abuse, history of gastric ulcer, AVMs, diabetes mellitus, chronic back pain, CHF, iron deficiency anemia due to chronic blood loss, chronic systolic heart failure, history of coronary artery disease, chronic hepatitis C ,who presents from nursing facility for the evaluation of 2-day history of maroon-colored bloody bowel movements.  She was recently hospitalized here from 11/3-11/20 for GI bleed.  At that time she underwent extensive GI evaluation.  When she presented here this time, her hemoglobin was 4.8.    She has been transfused with PRBCs.  Total of 4 units.  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  APC applied . She was having maroon-colored stools after the procedure.  So she underwent  reenteroscopy on 12/19/18.  Not found to have active bleeding.  She again had maroon-colored liquidy stools on 12/19/18 night .  She underwent  balloon enteroscopy with no finding of active bleeding source.   Since last two days, she does not have any bloody bowel movements.  Her hemoglobin is stable.  Plan is to discharge to skilled nursing facility tomorrow.  Skilled nursing facility could not accept the patient today.   Assessment & Plan:   Principal Problem:   GI bleed Active Problems:   Chronic systolic heart failure (HCC)   Diabetes (HCC)   COPD (chronic obstructive pulmonary disease) (HCC)   Symptomatic anemia   Upper GI bleed   B12 deficiency   Iron deficiency anemia due to chronic blood loss   Folate deficiency   Anxiety   CAD (coronary artery disease)   Chronic back pain   Depression   Diabetes mellitus type 2, uncontrolled (HCC)   GERD (gastroesophageal reflux disease)   Hyperlipidemia   Hypertension  Obesity   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HCC)   Gastritis and gastroduodenitis   AVM (arteriovenous malformation) of small bowel, acquired with hemorrhage   Acute GI bleed/hematochezia: C  She presented with maroon-colored stools for 2 days from skilled nursing facility.  FOBT positive.  Hemoglobin was 4.8 on admission.  Recently hospitalized this month and underwent GI evaluation with finding of AVM as noted on capsule endoscopy.  She underwent studies with colonoscopy, enteroscopy, capsule study, tagged RBC scan. underwent transfusion with 5 unit of PRBC on last admission.  When she was discharged home her hemoglobin was in the range of 10.  She also has history of gastritis/gastroenteritis. On this admission, she has been transfused with 4 units of PRBC.  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  APC applied . She was having maroon-colored stools after the procedure.  So she underwent  reenteroscopy on 12/19/18.  Not found to have active bleeding.  She again had maroon-colored liquidy stools on 12/19/18 night . She underwent  balloon enteroscopy with no finding of active bleeding source.  Since last two days, she does not have any bloody bowel movements.  Her hemoglobin is stable.  Plan is to discharge to skilled nursing facility tomorrow.  Skilled nursing facility could not accept the patient today.   Chronic systolic heart failure/restrictive  cardiomyopathy/coronary artery disease: Currently stable.  Denies any chest pain or shortness of breath.  Has trace edema on bilateral lower extremities.  Continue patient's home  regimen of Coreg, Aldactone, Demadex, Lipitor.  Monitor input/output  CKD stage 3:Baseline Creatinine fluctuates from 1.5-2.5.  Currently at baseline.  COPD with ongoing tobacco abuse: On Dulera, Spiriva.  Continue bronchodilators as needed.  History of hepatic encephalopathy: Noted on prior hospitalization.   History of hepatitis C/cirrhosis.  On lactulose.  Ammonia level is normal.Has mild thrombocytopenia due to liver cirrhosis  GERD: Continue PPI  Hypertension: Currently blood pressure stable.  Continue Coreg, spironolactone, Demadex  Diabetes type 2: Well controlled.  Hemoglobin A1c of 5.9 on 06/21/2018.  Continue sliding-scale insulin  Folic acid deficiency: Continue folate.  Debility/deconditioning/confusion at baseline:  Nursing home resident.  She is alert, awake but oriented to place and person only.  She states she ambulates with the help of walker.  Confused on baseline.  History of hepatic encephalopathy but ammonia is normal.  Continue supportive care.  Pressure Injury 12/16/18 Sacrum Mid Stage II -  Partial thickness loss of dermis presenting as a shallow open ulcer with a red, pink wound bed without slough. (Active)  12/16/18 1749  Location: Sacrum  Location Orientation: Mid  Staging: Stage II -  Partial thickness loss of dermis presenting as a shallow open ulcer with a red, pink wound bed without slough.  Wound Description (Comments):   Present on Admission: Yes     Pressure Injury 12/16/18 Heel Right Deep Tissue Injury - Purple or maroon localized area of discolored intact skin or blood-filled blister due to damage of underlying soft tissue from pressure and/or shear. (Active)  12/16/18   Location: Heel  Location Orientation: Right  Staging: Deep Tissue Injury - Purple or maroon localized area of discolored intact skin or blood-filled blister due to damage of underlying soft tissue from pressure and/or shear.  Wound Description (Comments):   Present on Admission: Yes       Nutrition Problem: Inadequate protein intake Etiology: other (see comment)(current diet order)      DVT prophylaxis:SCD Code Status: Full Family Communication: Called spouse on phone multiple times,connection unsuccessful Disposition Plan: Back to skilled nursing facility tomorrow   Consultants:  GI  Procedures: None  Antimicrobials:  Anti-infectives (From admission, onward)   None      Subjective: Patient seen and examined the bedside this morning.  Hemodynamically stable.  Comfortable.  Alert and awake and communicates well today.  Denies any complaints.  Eating her breakfast.  Objective: Vitals:   12/22/18 1803 12/22/18 2044 12/23/18 0443 12/23/18 1021  BP: 139/82 129/60 131/60 (!) 115/57  Pulse: 78 72 74 72  Resp: 16 18 18    Temp: 97.9 F (36.6 C) 98.1 F (36.7 C) 98.1 F (36.7 C)   TempSrc: Oral Oral    SpO2: 100% 100% 96%   Weight:      Height:        Intake/Output Summary (Last 24 hours) at 12/23/2018 1027 Last data filed at 12/23/2018 0034 Gross per 24 hour  Intake 120 ml  Output 2400 ml  Net -2280 ml   Filed Weights   12/20/18 0500 12/21/18 0451 12/22/18 0447  Weight: 90.3 kg 87 kg 85.2 kg    Examination:  General exam: Not in distress, deconditioned, debilitated HEENT:PERRL, Ear/Nose normal on gross exam Respiratory system: Bilateral equal air entry, normal vesicular breath sounds, no wheezes or crackles  Cardiovascular system: S1 & S2 heard, RRR. No JVD, murmurs, rubs, gallops or clicks.  Gastrointestinal system: Abdomen is nondistended, soft and nontender. No organomegaly or masses felt. Normal bowel sounds heard. Central nervous system: Alert  and awake.oriented x2  Extremities:Trace pedal  edema, no clubbing ,no cyanosis, amputation of left great toe  skin: No rashes, lesions ,no icterus ,no pallor,stage 2 sacral ulcer Psychiatry: Judgement and insight appear impaired   Data Reviewed: I have personally reviewed following labs and imaging studies  CBC: Recent Labs  Lab 12/19/18 1048 12/19/18 1440 12/19/18 2252 12/20/18 0542  12/20/18 2003 12/21/18 0426 12/21/18 1642 12/22/18 0540 12/23/18 0519  WBC 7.0 6.4 5.0 5.3  --   --   --   --   --  6.2  NEUTROABS 5.6 4.7 3.5 3.7  --   --   --   --   --  4.3  HGB 8.6* 8.5* 8.5* 7.9*   < >  7.9* 7.6* 7.8* 7.6* 8.1*  HCT 28.9* 28.1* 28.5* 26.8*   < > 26.5* 25.6* 25.8* 26.2* 26.5*  MCV 94.4 94.9 94.4 95.4  --   --   --   --   --  93.0  PLT 113* 118* 115* 118*  --   --   --   --   --  124*   < > = values in this interval not displayed.   Basic Metabolic Panel: Recent Labs  Lab 12/18/18 0644 12/19/18 0202 12/20/18 0542 12/21/18 0426 12/23/18 0519  NA 138 139 140 140 140  K 4.6 4.2 4.0 3.8 3.6  CL 104 106 110 107 101  CO2 23 22 21* 23 25  GLUCOSE 108* 107* 118* 94 114*  BUN 56* 51* 45* 37* 29*  CREATININE 2.04* 2.02* 1.99* 1.93* 1.98*  CALCIUM 8.7* 8.3* 8.2* 8.5* 8.4*   GFR: Estimated Creatinine Clearance: 30.9 mL/min (A) (by C-G formula based on SCr of 1.98 mg/dL (H)). Liver Function Tests: Recent Labs  Lab 12/16/18 1304 12/17/18 0039  AST 25 22  ALT 15 13  ALKPHOS 93 87  BILITOT 0.5 1.2  PROT 7.6 7.2  ALBUMIN 2.9* 2.8*   No results for input(s): LIPASE, AMYLASE in the last 168 hours. Recent Labs  Lab 12/16/18 1304  AMMONIA 20   Coagulation Profile: Recent Labs  Lab 12/16/18 1304  INR 1.3*   Cardiac Enzymes: No results for input(s): CKTOTAL, CKMB, CKMBINDEX, TROPONINI in the last 168 hours. BNP (last 3 results) No results for input(s): PROBNP in the last 8760 hours. HbA1C: No results for input(s): HGBA1C in the last 72 hours. CBG: Recent Labs  Lab 12/22/18 1219 12/22/18 1548 12/22/18 2343 12/23/18 0441 12/23/18 0736  GLUCAP 193* 174* 129* 119* 102*   Lipid Profile: No results for input(s): CHOL, HDL, LDLCALC, TRIG, CHOLHDL, LDLDIRECT in the last 72 hours. Thyroid Function Tests: No results for input(s): TSH, T4TOTAL, FREET4, T3FREE, THYROIDAB in the last 72 hours. Anemia Panel: No results for input(s): VITAMINB12, FOLATE, FERRITIN, TIBC, IRON, RETICCTPCT in the last 72 hours. Sepsis Labs: No results for input(s): PROCALCITON, LATICACIDVEN in the last 168 hours.  Recent Results (from the past 240 hour(s))  SARS CORONAVIRUS 2 (TAT  6-24 HRS) Nasopharyngeal Nasopharyngeal Swab     Status: None   Collection Time: 12/16/18  2:15 PM   Specimen: Nasopharyngeal Swab  Result Value Ref Range Status   SARS Coronavirus 2 NEGATIVE NEGATIVE Final    Comment: (NOTE) SARS-CoV-2 target nucleic acids are NOT DETECTED. The SARS-CoV-2 RNA is generally detectable in upper and lower respiratory specimens during the acute phase of infection. Negative results do not preclude SARS-CoV-2 infection, do not rule out co-infections with other pathogens, and should not be used as  the sole basis for treatment or other patient management decisions. Negative results must be combined with clinical observations, patient history, and epidemiological information. The expected result is Negative. Fact Sheet for Patients: SugarRoll.be Fact Sheet for Healthcare Providers: https://www.woods-mathews.com/ This test is not yet approved or cleared by the [place] FDA and  has been authorized for detection and/or diagnosis of SARS-CoV-2 by FDA under an Emergency Use Authorization (EUA). This EUA will remain  in effect (meaning this test can be used) for the duration of the COVID-19 declaration under Section 56 4(b)(1) of the Act, 21 U.S.C. section 360bbb-3(b)(1), unless the authorization is terminated or revoked sooner. Performed at [facility], [place] [address], [place], [place] [address]   MRSA PCR Screening     Status: None   Collection Time: 12/16/18  6:15 PM   Specimen: Nasal Mucosa; Nasopharyngeal  Result Value Ref Range Status   MRSA by PCR NEGATIVE NEGATIVE Final    Comment:        The GeneXpert MRSA Assay (FDA approved for NASAL specimens only), is one component of a comprehensive MRSA colonization surveillance program. It is not intended to diagnose MRSA infection nor to guide or monitor treatment for MRSA infections. Performed at [facility], [place] [address]  [place],  [address]          Radiology Studies: No results found.      Scheduled Meds: . ammonium lactate  1 application Topical BID  . atorvastatin  40 mg Oral q1800  . carvedilol  3.125 mg Oral BID WC  . Chlorhexidine Gluconate Cloth  6 each Topical Daily  . feeding supplement  1 Container Oral TID BM  . feeding supplement (PRO-STAT SUGAR FREE 64)  30 mL Oral BID  . fluticasone  2 spray Each Nare Daily  . folic acid  1 mg Oral Daily  . insulin aspart  0-9 Units Subcutaneous Q4H  . lactulose  20 g Oral TID  . mirtazapine  30 mg Oral QHS  . multivitamin with minerals  1 tablet Oral Daily  . pantoprazole  40 mg Oral BID  . sodium chloride flush  3 mL Intravenous Q12H  . sodium chloride flush  3 mL Intravenous Q12H  . spironolactone  12.5 mg Oral Daily  . torsemide  20 mg Oral Daily   Continuous Infusions: . sodium chloride Stopped (12/20/18 0943)  . sodium chloride       LOS: 7 days    Time spent: 35 mins.More than 50% of that time was spent in counseling and/or coordination of care.      Shelly Coss, MD [facility] Pager [phone]  If 7PM-7AM, please contact night-coverage [email or web address] Password [email or web address] 12/23/2018, 10:27 AM

## 2018-12-23 NOTE — TOC Progression Note (Signed)
Transition of Care [facility]) - Progression Note    Patient Details  Name: [name] MRN: [id number] Date of Birth: 1955/12/25  Transition of Care [facility]) CM/SW Contact  Servando Snare, [place] Phone Number: 12/23/2018, 11:23 AM  Clinical Narrative:    Patient medically stable for dc. Facility cannot accept patient today due to weekend staffing. Patient can return to facility tomorrow. LCSW notified attending.    Expected Discharge Plan: [place] Barriers to Discharge: No Barriers Identified, Other (comment)(medical stability)  Expected Discharge Plan and Services Expected Discharge Plan: [place] In-house Referral: Clinical Social Work     Living arrangements for the past 2 months: [place]                                       Social Determinants of Health (SDOH) Interventions    Readmission Risk Interventions Readmission Risk Prevention Plan 12/17/2018 10/26/2018 08/21/2018  Transportation Screening Complete Complete Complete  PCP or Specialist Appt within 5-7 Days - - -  Home Care Screening - - -  Medication Review (RN CM) - - -  [place] or [place] - Complete Complete  Social Work Consult for [place] Planning/Counseling - Not Complete -  SW consult not completed comments - not recovery care -  Palliative Care Screening - Not Applicable Not Applicable  Medication Review (RN Care Manager) Referral to Pharmacy Referral to Pharmacy Complete  PCP or Specialist appointment within 3-5 days of discharge Not Complete - -  PCP/Specialist Appt Not Complete comments DC date unknown but pt is established with providers - -  [place] or [place] Not Complete Complete -  [place] or Home Care Consult Pt Refusal Comments SNF resident - -  SW Recovery Care/Counseling Consult Complete - -  Palliative Care Screening Not Complete Not Applicable -  Comments pending need - -  Skilled Nursing Facility Complete - -  Some  recent data might be hidden

## 2018-12-24 LAB — CBC WITH DIFFERENTIAL/PLATELET
Abs Immature Granulocytes: 0.02 10*3/uL (ref 0.00–0.07)
Basophils Absolute: 0.1 10*3/uL (ref 0.0–0.1)
Basophils Relative: 1 %
Eosinophils Absolute: 0.4 10*3/uL (ref 0.0–0.5)
Eosinophils Relative: 5 %
HCT: 26.9 % — ABNORMAL LOW (ref 36.0–46.0)
Hemoglobin: 8.1 g/dL — ABNORMAL LOW (ref 12.0–15.0)
Immature Granulocytes: 0 %
Lymphocytes Relative: 16 %
Lymphs Abs: 1.2 10*3/uL (ref 0.7–4.0)
MCH: 27.8 pg (ref 26.0–34.0)
MCHC: 30.1 g/dL (ref 30.0–36.0)
MCV: 92.4 fL (ref 80.0–100.0)
Monocytes Absolute: 0.9 10*3/uL (ref 0.1–1.0)
Monocytes Relative: 12 %
Neutro Abs: 5 10*3/uL (ref 1.7–7.7)
Neutrophils Relative %: 66 %
Platelets: 130 10*3/uL — ABNORMAL LOW (ref 150–400)
RBC: 2.91 MIL/uL — ABNORMAL LOW (ref 3.87–5.11)
RDW: 16.4 % — ABNORMAL HIGH (ref 11.5–15.5)
WBC: 7.5 10*3/uL (ref 4.0–10.5)
nRBC: 0 % (ref 0.0–0.2)

## 2018-12-24 LAB — GLUCOSE, CAPILLARY
Glucose-Capillary: 111 mg/dL — ABNORMAL HIGH (ref 70–99)
Glucose-Capillary: 123 mg/dL — ABNORMAL HIGH (ref 70–99)
Glucose-Capillary: 141 mg/dL — ABNORMAL HIGH (ref 70–99)
Glucose-Capillary: 163 mg/dL — ABNORMAL HIGH (ref 70–99)
Glucose-Capillary: 167 mg/dL — ABNORMAL HIGH (ref 70–99)
Glucose-Capillary: 217 mg/dL — ABNORMAL HIGH (ref 70–99)

## 2018-12-24 MED ORDER — PANTOPRAZOLE SODIUM 40 MG PO TBEC: 40.0000 mg | DELAYED_RELEASE_TABLET | Freq: Two times a day (BID) | ORAL | Status: DC

## 2018-12-24 NOTE — TOC Progression Note (Signed)
Transition of Care [facility]) - Progression Note    Patient Details  Name: [name] MRN: [id number] Date of Birth: 15-Jun-1955  Transition of Care [facility]) CM/SW Contact  Pleshette Tomasini, Juliann Pulse, RN Phone Number: 12/24/2018, 10:20 AM  Clinical Narrative: Patient is from SNF not [facility]. PT/OT eval put in as imminent d/c prior to putting in fl2 to send to [facility]. Covid neg 12/6. Medically stable for d/c.      Expected Discharge Plan: Skilled Nursing Facility Barriers to Discharge: Other (comment)(From SNF not [facility]TC-PT/OT eval)  Expected Discharge Plan and Services Expected Discharge Plan: [place] In-house Referral: Clinical Social Work Discharge Planning Services: CM Consult   Living arrangements for the past 2 months: [place] Determinants of Health (SDOH) Interventions    Readmission Risk Interventions Readmission Risk Prevention Plan 12/17/2018 10/26/2018 08/21/2018  Transportation Screening Complete Complete Complete  PCP or Specialist Appt within 5-7 Days - - -  Home Care Screening - - -  Medication Review (RN CM) - - -  [place] or [place] - Complete Complete  Social Work Consult for [place] Planning/Counseling - Not Complete -  SW consult not completed comments - not recovery care -  Palliative Care Screening - Not Applicable Not Applicable  Medication Review (RN Care Manager) Referral to Pharmacy Referral to Pharmacy Complete  PCP or Specialist appointment within 3-5 days of discharge Not Complete - -  PCP/Specialist Appt Not Complete comments DC date unknown but pt is established with providers - -  [place] or [place] Not Complete Complete -  [place] or Home Care Consult Pt Refusal Comments SNF resident - -  SW Recovery Care/Counseling Consult Complete - -  Palliative Care Screening Not Complete Not Applicable -  Comments pending need - -  Skilled Nursing  Facility Complete - -  Some recent data might be hidden

## 2018-12-24 NOTE — Evaluation (Signed)
Physical Therapy Evaluation Patient Details Name: [name] MRN: [id number] DOB: 01/13/1956 Today's Date: 12/24/2018   History of Present Illness  2 female with a past medical history of COPD with ongoing tobacco abuse, history of gastric ulcer, AVMs, diabetes mellitus, chronic back pain, CHF, iron deficiency anemia due to chronic blood loss, chronic systolic heart failure, history of coronary artery disease, chronic hepatitis C and admitted for Acute GI bleed/hematochezia  Clinical Impression  Pt admitted with above diagnosis.  Pt currently with functional limitations due to the deficits listed below (see PT Problem List). Pt will benefit from skilled PT to increase their independence and safety with mobility to allow discharge to the venue listed below.  Pt only agreeable to sit EOB and declined any further activity.  Pt admitted from SNF (for rehab from notes).  Recommend pt return to SNF to continue/complete therapy.  Pt not verbalizing during session, only shaking head to yes/no questions.  Pt unable to recall where she was admitted from.     Follow Up Recommendations SNF;Supervision/Assistance - 24 hour    Equipment Recommendations  None recommended by PT    Recommendations for Other Services       Precautions / Restrictions Precautions Precautions: Fall Precaution Comments: minimal verbalizations      Mobility  Bed Mobility Overal bed mobility: Needs Assistance Bed Mobility: Sidelying to Sit;Sit to Sidelying;Rolling Rolling: Supervision   Supine to sit: Min guard   Sit to sidelying: Min assist General bed mobility comments: slow to mobilize, assist for LEs on bed  Transfers                 General transfer comment: pt declined transfer, declined bathroom, no willing to mobilize further then EOB  Ambulation/Gait                Stairs            Wheelchair Mobility    Modified Rankin (Stroke Patients Only)       Balance                                              Pertinent Vitals/Pain Pain Assessment: No/denies pain    Home Living Family/patient expects to be discharged to:: [place]: [place] - 2 wheels;Wheelchair - [employer];Tub bench;Grab bars - tub/shower Additional Comments: per previous note, pt minimally conversive with PT this session    Prior Function Level of Independence: Needs assistance   Gait / Transfers Assistance Needed: from 10/14 - Ambulates around 20 feet with SPC or SW; uses manual w/c (self propels) in community/outside; Pt states "no" when PT asks if pt has walked recently  ADL's / Homemaking Assistance Needed: unsure of assist needs at SNF        Hand Dominance   Dominant Hand: Right    Extremity/Trunk Assessment        Lower Extremity Assessment Lower Extremity Assessment: Generalized weakness       Communication   Communication: Expressive difficulties(hard to understand at times)  Cognition Arousal/Alertness: Awake/alert   Overall Cognitive Status: No family/caregiver present to determine baseline cognitive functioning  General Comments: pt flat, agitated and impulsive on last admission; pt mostly nonverbal and only would shake head yes/no      General Comments      Exercises     Assessment/Plan    PT Assessment Patient needs continued PT services  PT Problem List Decreased strength;Decreased activity tolerance;Decreased balance;Decreased mobility;Decreased knowledge of use of DME;Decreased cognition       PT Treatment Interventions DME instruction;Gait training;Functional mobility training;Therapeutic activities;Therapeutic exercise;Balance training;Patient/family education    PT Goals (Current goals can be found in the Care Plan section)  Acute Rehab PT Goals PT Goal Formulation: Patient unable to participate in goal  setting Time For Goal Achievement: 01/07/19 Potential to Achieve Goals: Fair    Frequency Min 2X/week   Barriers to discharge        Co-evaluation               AM-PAC PT "6 Clicks" Mobility  Outcome Measure Help needed turning from your back to your side while in a flat bed without using bedrails?: A Little Help needed moving from lying on your back to sitting on the side of a flat bed without using bedrails?: A Little Help needed moving to and from a bed to a chair (including a wheelchair)?: A Lot Help needed standing up from a chair using your arms (e.g., wheelchair or bedside chair)?: A Lot Help needed to walk in hospital room?: A Lot Help needed climbing 3-5 steps with a railing? : Total 6 Click Score: 13    End of Session   Activity Tolerance: Patient tolerated treatment well Patient left: in bed;with call bell/phone within reach;with bed alarm set Nurse Communication: Mobility status PT Visit Diagnosis: Other abnormalities of gait and mobility (R26.89);Muscle weakness (generalized) (M62.81)    Time: 2111-5520 PT Time Calculation (min) (ACUTE ONLY): 13 min   Charges:   PT Evaluation $PT Eval Low Complexity: [place], PT, DPT Acute Rehabilitation Services Office: [phone] Pager: [phone]  Trena Platt 12/24/2018, 3:06 PM

## 2018-12-24 NOTE — Evaluation (Signed)
Occupational Therapy Evaluation Patient Details Name: [name] MRN: [id number] DOB: [birth year] Today's Date: 12/24/2018    History of Present Illness 24 female with a past medical history of COPD with ongoing tobacco abuse, history of gastric ulcer, AVMs, diabetes mellitus, chronic back pain, CHF, iron deficiency anemia due to chronic blood loss, chronic systolic heart failure, history of coronary artery disease, chronic hepatitis C and admitted for Acute GI bleed/hematochezia   Clinical Impression   Pt admitted with the above. Pt currently with functional limitations due to the deficits listed below (see OT Problem List).  Pt will benefit from skilled OT to increase their safety and independence with ADL and functional mobility for ADL to facilitate discharge to venue listed below.      Follow Up Recommendations  SNF    Equipment Recommendations  None recommended by OT    Recommendations for Other Services       Precautions / Restrictions Precautions Precautions: Fall Precaution Comments: minimal verbalizations      Mobility Bed Mobility Overal bed mobility: Needs Assistance Bed Mobility: Sidelying to Sit;Sit to Sidelying;Rolling Rolling: Supervision   Supine to sit: Min guard   Sit to sidelying: Min assist General bed mobility comments: slow to mobilize, assist for LEs on bed  Transfers                 General transfer comment: declined sit to stand- only agreeable to sitting EOB        ADL either performed or assessed with clinical judgement   ADL Overall ADL's : Needs assistance/impaired     Grooming: Set up;Sitting;Wash/dry face;Oral care                   Toilet Transfer: Minimal assistance;+2 for safety/equipment;+2 for physical assistance;Ambulation;RW Toilet Transfer Details (indicate cue type and reason): simulated with chair transfer, decreased safety letting go of walker          Functional mobility during ADLs: Minimal  assistance;+2 for physical assistance;+2 for safety/equipment;Rolling walker General ADL Comments: pt did sit EOB for grooming activity Pt declined getting to chair with OT as lunch came. Explained benefits of eating OOB     Vision Patient Visual Report: No change from baseline              Pertinent Vitals/Pain Pain Assessment: No/denies pain     Hand Dominance Right   Extremity/Trunk Assessment Upper Extremity Assessment Upper Extremity Assessment: Generalized weakness   Lower Extremity Assessment Lower Extremity Assessment: Generalized weakness       Communication Communication Communication: Expressive difficulties(hard to understand at times)   Cognition Arousal/Alertness: Awake/alert   Overall Cognitive Status: No family/caregiver present to determine baseline cognitive functioning                                 General Comments: pt flat, agitated and impulsive on last admission; pt mostly nonverbal and only would shake head yes/no              Home Living Family/patient expects to be discharged to:: [place]: [place]ile - 2 wheels;Wheelchair - [employer];Tub bench;Grab bars - tub/shower   Additional Comments: per previous note, pt minimally conversive with PT this session      Prior  Functioning/Environment Level of Independence: Needs assistance  Gait / Transfers Assistance Needed: from 10/14 - Ambulates around 20 feet with SPC or SW; uses manual w/c (self propels) in community/outside; Pt states "no" when PT asks if pt has walked recently ADL's / Homemaking Assistance Needed: unsure of assist needs at SNF            OT Problem List: Decreased strength;Impaired balance (sitting and/or standing);Decreased activity tolerance;Decreased knowledge of use of DME or AE      OT Treatment/Interventions: Self-care/ADL training;Therapeutic activities;Patient/family  education    OT Goals(Current goals can be found in the care plan section) Acute Rehab OT Goals Patient Stated Goal: none stated Time For Goal Achievement: 12/21/18 Potential to Achieve Goals: Good  OT Frequency:      AM-PAC OT "6 Clicks" Daily Activity     Outcome Measure Help from another person eating meals?: None Help from another person taking care of personal grooming?: A Little Help from another person toileting, which includes using toliet, bedpan, or urinal?: A Lot Help from another person bathing (including washing, rinsing, drying)?: A Lot Help from another person to put on and taking off regular upper body clothing?: A Little Help from another person to put on and taking off regular lower body clothing?: A Lot 6 Click Score: 16   End of Session Nurse Communication: Mobility status  Activity Tolerance: Patient tolerated treatment well Patient left: in bed;with bed alarm set  OT Visit Diagnosis: Unsteadiness on feet (R26.81);Muscle weakness (generalized) (M62.81)                Time: 1300-1315 OT Time Calculation (min): 15 min Charges:  OT General Charges $OT Visit: 1 Visit OT Evaluation $OT Eval Low Complexity: 1 Low  Kari Baars, OT Acute Rehabilitation Services [phone] Office- [phone]     Autumnrose Yore, Edwena Felty D 12/24/2018, 4:20 PM

## 2018-12-24 NOTE — Discharge Summary (Addendum)
Physician Discharge Summary  [name] RSW:[id number] DOB: 14-May-1955 DOA: 12/16/2018  PCP: [facility] date: 12/16/2018 Discharge date: 12/25/18 Admitted From: Home Disposition:  Home  Discharge Condition:Stable CODE STATUS:FULL Diet recommendation: Heart Healthy   Brief/Interim Summary:  Patient is a 17 female with a past medical history of COPD with ongoing tobacco abuse, history of gastric ulcer, AVMs, diabetes mellitus, chronic back pain, CHF, iron deficiency anemia due to chronic blood loss, chronic systolic heart failure, history of coronary artery disease, chronic hepatitis C ,who presents from nursing facility for the evaluation of 2-day history of maroon-colored bloody bowel movements.  She was recently hospitalized here from 11/3-11/20 for GI bleed.  At that time she underwent extensive GI evaluation.  When she presented here this time, her hemoglobin was 4.8.    She has been transfused with PRBCs.  Total of 4 units.  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  APC applied . She was having maroon-colored stools after the procedure.  So she underwent  reenteroscopy on 12/19/18.  Not found to have active bleeding.  She again had maroon-colored liquidy stools on 12/19/18 night .  She underwent  balloon enteroscopy with no finding of active bleeding source.   Since last 3 days, she does not have any bloody bowel movements.  Her hemoglobin is stable.  Plan is to discharge to skilled nursing facility today.  Following problems were addressed during her hospitalization:  Acute GI bleed/hematochezia: C  She presented with maroon-colored stools for 2 days from skilled nursing facility.  FOBT positive.  Hemoglobin was 4.8 on admission.  Recently hospitalized this month and underwent GI evaluation with finding of AVM as noted on capsule endoscopy.  She underwent studies with colonoscopy, enteroscopy,  capsule study, tagged RBC scan. underwent transfusion with 5 unit of PRBC on last admission.  When she was discharged home her hemoglobin was in the range of 10.  She also has history of gastritis/gastroenteritis. On this admission, she has been transfused with 4 units of PRBC.  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  APC applied . She was having maroon-colored stools after the procedure.  So she underwent  reenteroscopy on 12/19/18.  Not found to have active bleeding.  She again had maroon-colored liquidy stools on 12/19/18 night . She underwent  balloon enteroscopy with no finding of active bleeding source.  Since last 3 days, she does not have any bloody bowel movements.  Her hemoglobin is stable.   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  Denies any chest pain or shortness of breath.  Has trace edema on bilateral lower extremities.  Continue patient's home regimen of Coreg, Aldactone, Demadex, Lipitor.   CKD stage 3:Baseline Creatinine fluctuates from 1.5-2.5.  Currently at baseline.  COPD with ongoing tobacco abuse: On Dulera, Spiriva.  Continue bronchodilators as needed.  History of hepatic encephalopathy: Noted on prior hospitalization.  History of hepatitis C/cirrhosis.  On lactulose.  Ammonia level is normal.Has mild thrombocytopenia due to liver cirrhosis  GERD: Continue PPI   Hypertension: Currently blood pressure stable.  Continue Coreg, spironolactone, Demadex  Diabetes type 2: Well controlled.  Hemoglobin A1c of 5.9 on 06/21/2018.   Folic acid deficiency: Continue folate.  Debility/deconditioning/confusion at baseline:  Nursing home resident.  She is alert, awake but oriented to place and person only.  She states she ambulates with the help of walker.  Confused on baseline.  History of hepatic encephalopathy but ammonia is  normal.  Continue supportive care  Discharge Diagnoses:  Principal Problem:   GI bleed Active Problems:   Chronic systolic heart failure (HCC)   Diabetes (HCC)   COPD (chronic obstructive pulmonary disease) (HCC)   Symptomatic anemia   Upper GI bleed   B12 deficiency   Iron deficiency anemia due to chronic blood loss   Folate deficiency   Anxiety   CAD (coronary artery disease)   Chronic back pain   Depression   Diabetes mellitus type 2, uncontrolled (HCC)   GERD (gastroesophageal reflux disease)   Hyperlipidemia   Hypertension   Obesity   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HCC)   Gastritis and gastroduodenitis   AVM (arteriovenous malformation) of small bowel, acquired with hemorrhage    Discharge Instructions  Discharge Instructions    Diet - low sodium heart healthy   Complete by: As directed    Discharge instructions   Complete by: As directed    1)Take medications as instructed. 2)Check a CBC and BMP test in a week.   Increase activity slowly   Complete by: As directed      Allergies as of 12/24/2018      Reactions   Latex Anaphylaxis, Rash   Gabapentin Nausea And Vomiting, Other (See Comments)      Medication List    TAKE these medications   ammonium lactate 12 % cream Commonly known as: AMLACTIN Apply 1 g topically 2 (two) times daily.   atorvastatin 40 MG tablet Commonly known as: LIPITOR Take 40 mg by mouth daily.   carvedilol 3.125 MG tablet Commonly known as: COREG Take 1 tablet (3.125 mg total) by mouth 2 (two) times daily with a meal.   feeding supplement (PRO-STAT SUGAR FREE 64) Liqd Take 30 mLs by mouth 2 (two) times daily.   fluticasone 50 MCG/ACT nasal spray Commonly known as: FLONASE Place 2 sprays into both nostrils daily.   folic acid 1 MG tablet Commonly known as: FOLVITE Take 1 tablet (1 mg total) by mouth daily.   lactulose 10 GM/15ML solution Commonly known as: CHRONULAC Take 30 mLs (20 g total) by mouth 3 (three) times  daily.   mirtazapine 30 MG disintegrating tablet Commonly known as: REMERON SOL-TAB Take 1 tablet (30 mg total) by mouth at bedtime.   multivitamin with minerals Tabs tablet Take 1 tablet by mouth daily.   pantoprazole 40 MG tablet Commonly known as: PROTONIX Take 1 tablet (40 mg total) by mouth 2 (two) times daily. What changed: when to take this   RESOURCE 2.0 PO Take 1 Bottle by mouth 2 (two) times daily.   spironolactone 25 MG tablet Commonly known as: ALDACTONE Take 12.5 mg by mouth daily.   torsemide 20 MG tablet Commonly known as: DEMADEX Take 20 mg by mouth daily. Take 2 tablets if weight is greater than 185 lbs. NO tablets if weight is less than 177 lbs.      Follow-up Information    [facility] Schedule an appointment as soon as possible for a visit in 1 week(s).   Contact information: [place] [address] [phone]          Allergies  Allergen Reactions  . Latex Anaphylaxis and Rash  . Gabapentin Nausea And Vomiting and Other (See Comments)    Consultations:  GI   Procedures/Studies: Nm Gi Blood Loss  Result Date: 11/26/2018  CLINICAL DATA:  Chronic iron deficiency anemia with GI blood loss attributed to duodenal AVM. EXAM: NUCLEAR MEDICINE GASTROINTESTINAL BLEEDING SCAN TECHNIQUE: Sequential abdominal images were obtained following intravenous administration of Tc-67mlabeled red blood cells. RADIOPHARMACEUTICALS:  25.4 mCi Tc-986mertechnetate in-vitro labeled red cells. COMPARISON:  CT scan 10/26/2010 FINDINGS: Findings no active GI bleeding is demonstrated during the 2 hour examination. IMPRESSION: Negative nuclear medicine GI bleeding study. Electronically Signed   By: P.Marijo Sanes.D.   On: 11/26/2018 15:38       Subjective:  Patient seen and examined the bedside this morning.  Hemodynamically stable.  Denies any abdominal pain, nausea or vomiting.  No further bloody bowel movements.  Stable for  discharge.  Discharge Exam: Vitals:   12/23/18 2021 12/24/18 0419  BP: (!) 105/48 115/61  Pulse: 81 73  Resp: 18 18  Temp: 98.1 F (36.7 C) 98.1 F (36.7 C)  SpO2: 97% 98%   Vitals:   12/23/18 1021 12/23/18 1355 12/23/18 2021 12/24/18 0419  BP: (!) 115/57 (!) 125/57 (!) 105/48 115/61  Pulse: 72 73 81 73  Resp:  17 18 18   Temp:  98.4 F (36.9 C) 98.1 F (36.7 C) 98.1 F (36.7 C)  TempSrc:  Oral    SpO2:  100% 97% 98%  Weight:      Height:        General: Pt is alert, awake, not in acute distress Cardiovascular: RRR, S1/S2 +, no rubs, no gallops Respiratory: CTA bilaterally, no wheezing, no rhonchi Abdominal: Soft, NT, ND, bowel sounds + Extremities: no edema, no cyanosis    The results of significant diagnostics from this hospitalization (including imaging, microbiology, ancillary and laboratory) are listed below for reference.     Microbiology: Recent Results (from the past 240 hour(s))  SARS CORONAVIRUS 2 (TAT 6-24 HRS) Nasopharyngeal Nasopharyngeal Swab     Status: None   Collection Time: 12/16/18  2:15 PM   Specimen: Nasopharyngeal Swab  Result Value Ref Range Status   SARS Coronavirus 2 NEGATIVE NEGATIVE Final    Comment: (NOTE) SARS-CoV-2 target nucleic acids are NOT DETECTED. The SARS-CoV-2 RNA is generally detectable in upper and lower respiratory specimens during the acute phase of infection. Negative results do not preclude SARS-CoV-2 infection, do not rule out co-infections with other pathogens, and should not be used as the sole basis for treatment or other patient management decisions. Negative results must be combined with clinical observations, patient history, and epidemiological information. The expected result is Negative. Fact Sheet for Patients: htSugarRoll.beact Sheet for Healthcare Providers: hthttps://www.woods-mathews.com/his test is not yet approved or cleared by the [place] and  has  been authorized for detection and/or diagnosis of SARS-CoV-2 by FDA under an Emergency Use Authorization (EUA). This EUA will remain  in effect (meaning this test can be used) for the duration of the COVID-19 declaration under Section 56 4(b)(1) of the Act, 21 U.S.C. section 360bbb-3(b)(1), unless the authorization is terminated or revoked sooner. Performed at [facility] [place] [id number] MRSA PCR Screening     Status: None   Collection Time: 12/16/18  6:15 PM   Specimen: Nasal Mucosa; Nasopharyngeal  Result Value Ref Range Status   MRSA by PCR NEGATIVE NEGATIVE Final    Comment:        The GeneXpert MRSA Assay (FDA approved for NASAL specimens only), is one component of a comprehensive MRSA colonization surveillance program. It is not intended to diagnose  MRSA infection nor to guide or monitor treatment for MRSA infections. Performed at [facility], [place] [address] [place], [place] [address]   SARS CORONAVIRUS 2 (TAT 6-24 HRS) Nasopharyngeal Nasopharyngeal Swab     Status: None   Collection Time: 12/23/18  1:56 PM   Specimen: Nasopharyngeal Swab  Result Value Ref Range Status   SARS Coronavirus 2 NEGATIVE NEGATIVE Final    Comment: (NOTE) SARS-CoV-2 target nucleic acids are NOT DETECTED. The SARS-CoV-2 RNA is generally detectable in upper and lower respiratory specimens during the acute phase of infection. Negative results do not preclude SARS-CoV-2 infection, do not rule out co-infections with other pathogens, and should not be used as the sole basis for treatment or other patient management decisions. Negative results must be combined with clinical observations, patient history, and epidemiological information. The expected result is Negative. Fact Sheet for Patients: SugarRoll.be Fact Sheet for Healthcare Providers: https://www.woods-mathews.com/ This test is not yet  approved or cleared by the [place] FDA and  has been authorized for detection and/or diagnosis of SARS-CoV-2 by FDA under an Emergency Use Authorization (EUA). This EUA will remain  in effect (meaning this test can be used) for the duration of the COVID-19 declaration under Section 56 4(b)(1) of the Act, 21 U.S.C. section 360bbb-3(b)(1), unless the authorization is terminated or revoked sooner. Performed at [facility], [place] [address], [place], [place] [address]      Labs: BNP (last 3 results) Recent Labs    01/13/18 1148 01/15/18 0421 08/17/18 1030  BNP 1,068.0* 596.0* 983.3*   Basic Metabolic Panel: Recent Labs  Lab 12/18/18 0644 12/19/18 0202 12/20/18 0542 12/21/18 0426 12/23/18 0519  NA 138 139 140 140 140  K 4.6 4.2 4.0 3.8 3.6  CL 104 106 110 107 101  CO2 23 22 21* 23 25  GLUCOSE 108* 107* 118* 94 114*  BUN 56* 51* 45* 37* 29*  CREATININE 2.04* 2.02* 1.99* 1.93* 1.98*  CALCIUM 8.7* 8.3* 8.2* 8.5* 8.4*   Liver Function Tests: No results for input(s): AST, ALT, ALKPHOS, BILITOT, PROT, ALBUMIN in the last 168 hours. No results for input(s): LIPASE, AMYLASE in the last 168 hours. No results for input(s): AMMONIA in the last 168 hours. CBC: Recent Labs  Lab 12/19/18 1440 12/19/18 2252 12/20/18 0542  12/21/18 0426 12/21/18 1642 12/22/18 0540 12/23/18 0519 12/24/18 0444  WBC 6.4 5.0 5.3  --   --   --   --  6.2 7.5  NEUTROABS 4.7 3.5 3.7  --   --   --   --  4.3 5.0  HGB 8.5* 8.5* 7.9*   < > 7.6* 7.8* 7.6* 8.1* 8.1*  HCT 28.1* 28.5* 26.8*   < > 25.6* 25.8* 26.2* 26.5* 26.9*  MCV 94.9 94.4 95.4  --   --   --   --  93.0 92.4  PLT 118* 115* 118*  --   --   --   --  124* 130*   < > = values in this interval not displayed.   Cardiac Enzymes: No results for input(s): CKTOTAL, CKMB, CKMBINDEX, TROPONINI in the last 168 hours. BNP: Invalid input(s): POCBNP CBG: Recent Labs  Lab 12/23/18 1656 12/23/18 2015 12/24/18 0009 12/24/18 0416  12/24/18 0743  GLUCAP 130* 142* 163* 123* 111*   D-Dimer No results for input(s): DDIMER in the last 72 hours. Hgb A1c No results for input(s): HGBA1C in the last 72 hours. Lipid Profile No results for input(s): CHOL, HDL, LDLCALC,  TRIG, CHOLHDL, LDLDIRECT in the last 72 hours. Thyroid function studies No results for input(s): TSH, T4TOTAL, T3FREE, THYROIDAB in the last 72 hours.  Invalid input(s): FREET3 Anemia work up No results for input(s): VITAMINB12, FOLATE, FERRITIN, TIBC, IRON, RETICCTPCT in the last 72 hours. Urinalysis    Component Value Date/Time   COLORURINE YELLOW 11/20/2018 1320   APPEARANCEUR CLEAR 11/20/2018 1320   APPEARANCEUR Clear 08/03/2013 2138   LABSPEC 1.009 11/20/2018 1320   LABSPEC 1.028 08/03/2013 2138   PHURINE 7.0 11/20/2018 1320   GLUCOSEU NEGATIVE 11/20/2018 1320   GLUCOSEU Negative 08/03/2013 2138   HGBUR NEGATIVE 11/20/2018 1320   BILIRUBINUR NEGATIVE 11/20/2018 1320   BILIRUBINUR Negative 08/03/2013 2138   KETONESUR NEGATIVE 11/20/2018 1320   PROTEINUR NEGATIVE 11/20/2018 1320   UROBILINOGEN 0.2 09/27/2006 2132   NITRITE NEGATIVE 11/20/2018 1320   LEUKOCYTESUR NEGATIVE 11/20/2018 1320   LEUKOCYTESUR Negative 08/03/2013 2138   Sepsis Labs Invalid input(s): PROCALCITONIN,  WBC,  LACTICIDVEN Microbiology Recent Results (from the past 240 hour(s))  SARS CORONAVIRUS 2 (TAT 6-24 HRS) Nasopharyngeal Nasopharyngeal Swab     Status: None   Collection Time: 12/16/18  2:15 PM   Specimen: Nasopharyngeal Swab  Result Value Ref Range Status   SARS Coronavirus 2 NEGATIVE NEGATIVE Final    Comment: (NOTE) SARS-CoV-2 target nucleic acids are NOT DETECTED. The SARS-CoV-2 RNA is generally detectable in upper and lower respiratory specimens during the acute phase of infection. Negative results do not preclude SARS-CoV-2 infection, do not rule out co-infections with other pathogens, and should not be used as the sole basis for treatment or other patient  management decisions. Negative results must be combined with clinical observations, patient history, and epidemiological information. The expected result is Negative. Fact Sheet for Patients: SugarRoll.be Fact Sheet for Healthcare Providers: https://www.woods-mathews.com/ This test is not yet approved or cleared by the [place] FDA and  has been authorized for detection and/or diagnosis of SARS-CoV-2 by FDA under an Emergency Use Authorization (EUA). This EUA will remain  in effect (meaning this test can be used) for the duration of the COVID-19 declaration under Section 56 4(b)(1) of the Act, 21 U.S.C. section 360bbb-3(b)(1), unless the authorization is terminated or revoked sooner. Performed at [facility], [place] [address], [place], [place] [address]   MRSA PCR Screening     Status: None   Collection Time: 12/16/18  6:15 PM   Specimen: Nasal Mucosa; Nasopharyngeal  Result Value Ref Range Status   MRSA by PCR NEGATIVE NEGATIVE Final    Comment:        The GeneXpert MRSA Assay (FDA approved for NASAL specimens only), is one component of a comprehensive MRSA colonization surveillance program. It is not intended to diagnose MRSA infection nor to guide or monitor treatment for MRSA infections. Performed at [facility], [place] [address] [place], [place] [address]   SARS CORONAVIRUS 2 (TAT 6-24 HRS) Nasopharyngeal Nasopharyngeal Swab     Status: None   Collection Time: 12/23/18  1:56 PM   Specimen: Nasopharyngeal Swab  Result Value Ref Range Status   SARS Coronavirus 2 NEGATIVE NEGATIVE Final    Comment: (NOTE) SARS-CoV-2 target nucleic acids are NOT DETECTED. The SARS-CoV-2 RNA is generally detectable in upper and lower respiratory specimens during the acute phase of infection. Negative results do not preclude SARS-CoV-2 infection, do not rule out co-infections with other pathogens, and should not be used  as the sole basis for treatment or other patient management decisions. Negative results  must be combined with clinical observations, patient history, and epidemiological information. The expected result is Negative. Fact Sheet for Patients: SugarRoll.be Fact Sheet for Healthcare Providers: https://www.woods-mathews.com/ This test is not yet approved or cleared by the [place] FDA and  has been authorized for detection and/or diagnosis of SARS-CoV-2 by FDA under an Emergency Use Authorization (EUA). This EUA will remain  in effect (meaning this test can be used) for the duration of the COVID-19 declaration under Section 56 4(b)(1) of the Act, 21 U.S.C. section 360bbb-3(b)(1), unless the authorization is terminated or revoked sooner. Performed at [facility], [place] [address] [place], [place] [address]     Please note: You were cared for by a hospitalist during your hospital stay. Once you are discharged, your primary care physician will handle any further medical issues. Please note that NO REFILLS for any discharge medications will be authorized once you are discharged, as it is imperative that you return to your primary care physician (or establish a relationship with a primary care physician if you do not have one) for your post hospital discharge needs so that they can reassess your need for medications and monitor your lab values.    Time coordinating discharge: 40 minutes  SIGNED:   Shelly Coss, MD  [facility] 12/24/2018, 10:38 AM Pager [phone]  If 7PM-7AM, please contact night-coverage [email or web address] Password [email or web address]

## 2018-12-25 DIAGNOSIS — Z8719 Personal history of other diseases of the digestive system: Secondary | ICD-10-CM

## 2018-12-25 LAB — GLUCOSE, CAPILLARY
Glucose-Capillary: 111 mg/dL — ABNORMAL HIGH (ref 70–99)
Glucose-Capillary: 114 mg/dL — ABNORMAL HIGH (ref 70–99)
Glucose-Capillary: 128 mg/dL — ABNORMAL HIGH (ref 70–99)
Glucose-Capillary: 147 mg/dL — ABNORMAL HIGH (ref 70–99)
Glucose-Capillary: 184 mg/dL — ABNORMAL HIGH (ref 70–99)
Glucose-Capillary: 253 mg/dL — ABNORMAL HIGH (ref 70–99)

## 2018-12-25 LAB — CBC WITH DIFFERENTIAL/PLATELET
Abs Immature Granulocytes: 0.03 10*3/uL (ref 0.00–0.07)
Basophils Absolute: 0.1 10*3/uL (ref 0.0–0.1)
Basophils Relative: 1 %
Eosinophils Absolute: 0.5 10*3/uL (ref 0.0–0.5)
Eosinophils Relative: 5 %
HCT: 24.7 % — ABNORMAL LOW (ref 36.0–46.0)
Hemoglobin: 7.4 g/dL — ABNORMAL LOW (ref 12.0–15.0)
Immature Granulocytes: 0 %
Lymphocytes Relative: 9 %
Lymphs Abs: 0.8 10*3/uL (ref 0.7–4.0)
MCH: 27.5 pg (ref 26.0–34.0)
MCHC: 30 g/dL (ref 30.0–36.0)
MCV: 91.8 fL (ref 80.0–100.0)
Monocytes Absolute: 0.8 10*3/uL (ref 0.1–1.0)
Monocytes Relative: 9 %
Neutro Abs: 6.3 10*3/uL (ref 1.7–7.7)
Neutrophils Relative %: 76 %
Platelets: 130 10*3/uL — ABNORMAL LOW (ref 150–400)
RBC: 2.69 MIL/uL — ABNORMAL LOW (ref 3.87–5.11)
RDW: 16.1 % — ABNORMAL HIGH (ref 11.5–15.5)
WBC: 8.4 10*3/uL (ref 4.0–10.5)
nRBC: 0 % (ref 0.0–0.2)

## 2018-12-25 MED ORDER — SODIUM CHLORIDE 0.9 % IV SOLN
510.0000 mg | Freq: Once | INTRAVENOUS | Status: AC
  Administered 2018-12-26: 09:00:00 510 mg via INTRAVENOUS
  Filled 2018-12-25: qty 17

## 2018-12-25 MED ORDER — PEG-KCL-NACL-NASULF-NA ASC-C 100 G PO SOLR
0.5000 | Freq: Once | ORAL | Status: AC
  Administered 2018-12-25: 100 g via ORAL
  Filled 2018-12-25: qty 1

## 2018-12-25 MED ORDER — SODIUM CHLORIDE 0.9 % IV SOLN: 510.0000 mg | Freq: Once | INTRAVENOUS | Status: DC

## 2018-12-25 NOTE — Progress Notes (Signed)
     Progress Note    ASSESSMENT AND PLAN:   63 yo female with recurrent overt but obscure recurrent GI bleeding.She has had an extensive endoscopic evaluation over the years with findings of both bleeding and non-bleeding AVMs. This admission she underwen 2 small bowel enteroscopies and one balloon enteroscopy. 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 She rebled and repeat enteroscopy on 12/2 showed some ulceration at [facility] site, not actively bleeding but treated like it was the source. No active bleeding on balloon enteroscopy 12/20/18. Was for discharge home today but passed a bloody stool so discharge on hold. Hgb declined from 8.1 to 7.4.  --Will proceed with small bowel video capsule study tomorrow am. Clear liquids, NPO after MN. Will give 1/2 bowel prep at 7 pm today.  --Monitor H+H and transfuse prn.     SUBJECTIVE   Sleepy, no complaints. Says she hasn't had any further BMs / blood. I confirmed this with RN. According to RN the BM contained a large amount of maroon and bright red red.    OBJECTIVE:     Vital signs in last 24 hours: Temp:  [98.5 F (36.9 C)-98.6 F (37 C)] 98.6 F (37 C) (12/08 0429) Pulse Rate:  [72-79] 77 (12/08 0429) Resp:  [16-18] 16 (12/08 0429) BP: (103-126)/(50-79) 126/64 (12/08 0429) SpO2:  [97 %-98 %] 97 % (12/08 0429) Weight:  [81.6 kg] 81.6 kg (12/08 0429) Last BM Date: 12/23/18 General:   Alert, well-developed female in NAD EENT:  Normal hearing, non icteric sclera   Heart:  Regular rate and rhythm;  No lower extremity edema   Pulm: Normal respiratory effort   Abdomen:  Soft, nondistended, nontender.  Normal bowel sounds.          Neurologic:  Alert and  oriented x4;  grossly normal neurologically. Psych:  Pleasant, cooperative.  Normal mood and affect.   Intake/Output from previous day: 12/07 0701 - 12/08 0700 In: 660 [P.O.:660] Out: 1400 [Urine:1400] Intake/Output this shift: Total I/O In: 240 [P.O.:240] Out: -    Lab Results: Recent Labs    12/23/18 0519 12/24/18 0444 12/25/18 1206  WBC 6.2 7.5 8.4  HGB 8.1* 8.1* 7.4*  HCT 26.5* 26.9* 24.7*  PLT 124* 130* 130*   BMET Recent Labs    12/23/18 0519  NA 140  K 3.6  CL 101  CO2 25  GLUCOSE 114*  BUN 29*  CREATININE 1.98*  CALCIUM 8.4*     Principal Problem:   GI bleed Active Problems:   Chronic systolic heart failure (HCC)   Diabetes (HCC)   COPD (chronic obstructive pulmonary disease) (HCC)   Symptomatic anemia   Upper GI bleed   B12 deficiency   Iron deficiency anemia due to chronic blood loss   Folate deficiency   Anxiety   CAD (coronary artery disease)   Chronic back pain   Depression   Diabetes mellitus type 2, uncontrolled (HCC)   GERD (gastroesophageal reflux disease)   Hyperlipidemia   Hypertension   Obesity   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

## 2018-12-25 NOTE — Progress Notes (Signed)
Patient seen and examined at bedside this morning.  No change since yesterday.  She remains hemodynamically stable.  No bloody bowel movement.  Denies any nausea, vomiting or abdominal pain.  Discharge summary and orders have already been done.  She is medically stable for discharge back to her facility today.

## 2018-12-25 NOTE — Progress Notes (Signed)
Patient had a moderate sized loose maroon colored bowel movement with a moderate amount of dark mixed with bright red blood. Per MD notes and night RN, patient has not had any blood noted in 3 days from stool.  MD informed of changes. Will inform CM that potential d/c on hold and MD will consult GI again.  Will continue to monitor for any additional signs of bleeding.

## 2018-12-25 NOTE — Progress Notes (Signed)
Patient's spouse called back and after reviewing the procedure with him and him consenting verbally with patient's RN over the phone, he then changed his mind with the 2nd nurse obtaining consent.  Patient's spouse stated "I want to speak with the doctor before they do anything on my wife." I informed the patient that I will page the doctor and let them know he would like to speak with them. At this time, consent is not signed. Will page MD and pass along to oncoming shift.

## 2018-12-25 NOTE — TOC Transition Note (Signed)
Transition of Care [facility]) - CM/SW Discharge Note   Patient Details  Name: SYRIA KESTNER MRN: [id number] Date of Birth: 1955-05-28  Transition of Care [facility]) CM/SW Contact:  Dessa Phi, RN Phone Number: 12/25/2018, 10:44 AM   Clinical Narrative: Faxed w/confirmation fl2,d/c summary to SNF [facility] rep [place] notified. Await bed availability.      Final next level of care: Skilled Nursing Facility Barriers to Discharge: No Barriers Identified   Patient Goals and CMS Choice Patient states their goals for this hospitalization and ongoing recovery are:: pt not oriented to goal-set      Discharge Placement   Existing PASRR number confirmed : 12/25/18          Patient [facility] bed at: [facility] Patient to be transferred to facility by: [place] Name of family member notified: patient Patient and family notified of of transfer: 12/25/18  Discharge Plan and Services In-house Referral: Clinical Social Work Discharge Planning Services: CM Consult                                 Social Determinants of Health (SDOH) Interventions     Readmission Risk Interventions Readmission Risk Prevention Plan 12/17/2018 10/26/2018 08/21/2018  Transportation Screening Complete Complete Complete  PCP or Specialist Appt within 5-7 Days - - -  Home Care Screening - - -  Medication Review (RN CM) - - -  [place] or [place] - Complete Complete  Social Work Consult for [place] Planning/Counseling - Not Complete -  SW consult not completed comments - not recovery care -  Palliative Care Screening - Not Applicable Not Applicable  Medication Review (RN Care Manager) Referral to Pharmacy Referral to Pharmacy Complete  PCP or Specialist appointment within 3-5 days of discharge Not Complete - -  PCP/Specialist Appt Not Complete comments DC date unknown but pt is established with providers - -  [place] or [place] Not Complete Complete -  [place] or Home Care Consult Pt  Refusal Comments SNF resident - -  SW Recovery Care/Counseling Consult Complete - -  Palliative Care Screening Not Complete Not Applicable -  Comments pending need - -  Skilled Nursing Facility Complete - -  Some recent data might be hidden

## 2018-12-25 NOTE — NC FL2 (Signed)
[place] LEVEL OF CARE SCREENING TOOL     IDENTIFICATION  Patient Name: [name] Birthdate: 09/29/1955 Sex: female Admission Date (Current Location): 12/16/2018  [place] and [place] Number:  Kathleen Argue [id number] [place] and Address:  [facility],  [place] [address], [place]      Provider Number: [id number]  Attending Physician Name and Address:  Shelly Coss, MD  Relative Name and Phone Number:  Scherrie Seneca spouse [phone]    Current Level of Care: SNF Recommended Level of Care: [place] Prior Approval Number:    Date Approved/Denied:   PASRR Number:  [id number]  Discharge Plan: SNF    Current Diagnoses: Patient Active Problem List   Diagnosis Date Noted  . AVM (arteriovenous malformation) of small bowel, acquired with hemorrhage   . Epistaxis 11/29/2018  . Altered mental status   . Heme positive stool   . Gastritis and gastroduodenitis   . Encephalopathy acute   . Acute on chronic blood loss anemia 11/20/2018  . Pressure injury of skin 10/26/2018  . Anxiety 08/15/2018  . Chronic back pain 08/15/2018  . GERD (gastroesophageal reflux disease) 08/15/2018  . History of carpal tunnel syndrome 08/15/2018  . HNP (herniated nucleus pulposus), lumbar 08/15/2018  . Hyperlipidemia 08/15/2018  . Hypertension 08/15/2018  . Insomnia 08/15/2018  . Left ventricular hypertrophy 08/15/2018  . Obesity 08/15/2018  . Sleep apnea 08/15/2018  . Severe anemia 06/20/2018  . Diabetic polyneuropathy associated with type 2 diabetes mellitus ([place]) 02/23/2018  . Moderate tricuspid regurgitation 02/18/2018  . Acute on chronic diastolic CHF (congestive heart failure) ([place]) 02/08/2018  . 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 Depression 02/05/2018  . Generalized abdominal pain 02/05/2018  . CHF (congestive heart failure) ([place]) 01/13/2018  . B12 deficiency 12/07/2017  . Iron deficiency anemia due to  chronic blood loss 12/07/2017  . Folate deficiency 12/07/2017  . Upper GI bleed 11/03/2017  . COPD (chronic obstructive pulmonary disease) ([place]) 09/26/2017  . Symptomatic anemia 09/26/2017  . Acute on chronic systolic CHF (congestive heart failure) ([place]) 08/27/2017  . Anemia   . Weakness   . GI bleed 08/07/2017  . Chronic systolic heart failure ([place]) 12/28/2016  . Diabetes ([place]) 12/28/2016  . Tobacco use 12/28/2016  . NSTEMI (non-ST elevated myocardial infarction) ([place])   . Sepsis ([place]) 12/18/2016  . CHF exacerbation ([place]) 12/04/2015  . PNA (pneumonia) 10/28/2015  . COPD exacerbation ([place]) 08/29/2015  . Chronic hepatitis C without hepatic coma ([place]) 05/21/2014  . CAD (coronary artery disease) 07/19/2013  . Right arm pain 06/26/2013  . Cervical pain (neck) 03/28/2013  . Radiculopathy affecting upper extremity 03/28/2013  . Impaired renal function 12/21/2012  . Abnormal mammogram 12/20/2012  . Diabetes mellitus type 2, uncontrolled ([place]) 12/20/2012    Orientation RESPIRATION BLADDER Height & Weight     Self, Time, Situation, Place  Normal Incontinent Weight: 81.6 kg Height:  5' 5"  (165.1 cm)  BEHAVIORAL SYMPTOMS/MOOD NEUROLOGICAL BOWEL NUTRITION STATUS      Incontinent Diet(Regular)  AMBULATORY STATUS COMMUNICATION OF NEEDS Skin   Limited Assist Verbally Skin abrasions(stage 2 buttocks-foam dsg qd)                       Personal Care Assistance Level of Assistance  Bathing, Feeding, Dressing Bathing Assistance: Limited assistance Feeding assistance: Limited assistance Dressing Assistance: Limited assistance Total Care Assistance: Limited assistance   Functional Limitations Info  Sight, Hearing Sight Info: Impaired(eyeglasses) Hearing Info: Adequate Speech  Info: Adequate    SPECIAL CARE FACTORS FREQUENCY  PT (By licensed PT), OT (By licensed OT)     PT Frequency: 5x week OT Frequency: 5x week            Contractures Contractures Info: Not present     Additional Factors Info  Code Status, Allergies Code Status Info: Full code Allergies Info: Latex,gabapentin   Insulin Sliding Scale Info: CBG SSI       Current Medications (12/25/2018):  This is the current hospital active medication list Current Facility-Administered Medications  Medication Dose Route Frequency Provider Last Rate Last Dose  . 0.9 %  sodium chloride infusion  10 mL/hr Intravenous Once Cirigliano, Vito V, DO   Stopped at 12/20/18 0943  . 0.9 %  sodium chloride infusion  250 mL Intravenous PRN Cirigliano, Vito V, DO      . acetaminophen (TYLENOL) tablet 650 mg  650 mg Oral Q6H PRN Cirigliano, Vito V, DO       Or  . acetaminophen (TYLENOL) suppository 650 mg  650 mg Rectal Q6H PRN Cirigliano, Vito V, DO      . albuterol (PROVENTIL) (2.5 MG/3ML) 0.083% nebulizer solution 2.5 mg  2.5 mg Nebulization Q2H PRN Cirigliano, Vito V, DO      . ammonium lactate (LAC-HYDRIN) 12 % lotion 1 application  1 application Topical BID Cirigliano, Vito V, DO   1 application at 30/09/23 2200  . atorvastatin (LIPITOR) tablet 40 mg  40 mg Oral q1800 Cirigliano, Vito V, DO   40 mg at 12/24/18 1759  . carvedilol (COREG) tablet 3.125 mg  3.125 mg Oral BID WC Cirigliano, Vito V, DO   3.125 mg at 12/25/18 0840  . Chlorhexidine Gluconate Cloth 2 % PADS 6 each  6 each Topical Daily Cirigliano, Vito V, DO   6 each at 12/22/18 1304  . feeding supplement (BOOST / RESOURCE BREEZE) liquid 1 Container  1 Container Oral TID BM Cirigliano, Vito V, DO   1 Container at 12/25/18 0839  . feeding supplement (PRO-STAT SUGAR FREE 64) liquid 30 mL  30 mL Oral BID Cirigliano, Vito V, DO   30 mL at 12/24/18 1238  . fluticasone (FLONASE) 50 MCG/ACT nasal spray 2 spray  2 spray Each Nare Daily Cirigliano, Vito V, DO   2 spray at 12/25/18 0842  . folic acid (FOLVITE) tablet 1 mg  1 mg Oral Daily Cirigliano, Vito V, DO   1 mg at 12/25/18 0840  . hydrALAZINE (APRESOLINE) injection 10 mg  10 mg Intravenous Q4H PRN  Cirigliano, Vito V, DO      . insulin aspart (novoLOG) injection 0-9 Units  0-9 Units Subcutaneous Q4H Cirigliano, Vito V, DO   1 Units at 12/25/18 0842  . lactulose (CHRONULAC) 10 GM/15ML solution 20 g  20 g Oral TID Cirigliano, Vito V, DO   20 g at 12/25/18 0842  . mirtazapine (REMERON SOL-TAB) disintegrating tablet 30 mg  30 mg Oral QHS Cirigliano, Vito V, DO   30 mg at 12/24/18 2128  . multivitamin with minerals tablet 1 tablet  1 tablet Oral Daily Cirigliano, Vito V, DO   1 tablet at 12/25/18 0842  . ondansetron (ZOFRAN) tablet 4 mg  4 mg Oral Q6H PRN Cirigliano, Vito V, DO       Or  . ondansetron (ZOFRAN) injection 4 mg  4 mg Intravenous Q6H PRN Cirigliano, Vito V, DO   4 mg at 12/19/18 0829  . pantoprazole (PROTONIX) EC tablet 40 mg  40 mg Oral BID Loralie Champagne, PA-C   40 mg at 12/25/18 0840  . senna-docusate (Senokot-S) tablet 1 tablet  1 tablet Oral QHS PRN Cirigliano, Vito V, DO      . sodium chloride flush (NS) 0.9 % injection 3 mL  3 mL Intravenous Q12H Cirigliano, Vito V, DO   3 mL at 12/23/18 1028  . sodium chloride flush (NS) 0.9 % injection 3 mL  3 mL Intravenous Q12H Cirigliano, Vito V, DO   3 mL at 12/25/18 0844  . sodium chloride flush (NS) 0.9 % injection 3 mL  3 mL Intravenous PRN Cirigliano, Vito V, DO      . sorbitol 70 % solution 30 mL  30 mL Oral Daily PRN Cirigliano, Vito V, DO      . spironolactone (ALDACTONE) tablet 12.5 mg  12.5 mg Oral Daily Cirigliano, Vito V, DO   12.5 mg at 12/25/18 0842  . torsemide (DEMADEX) tablet 20 mg  20 mg Oral Daily Cirigliano, Vito V, DO   20 mg at 12/25/18 0840  . traMADol (ULTRAM) tablet 100 mg  100 mg Oral Q6H PRN Cirigliano, Vito V, DO         Discharge Medications: Please see discharge summary for a list of discharge medications.  Relevant Imaging Results:  Relevant Lab Results:   Additional Information [id number]  Thanya Cegielski, Juliann Pulse, RN

## 2018-12-25 NOTE — Progress Notes (Signed)
Attempted to call and obtain consent from patient's husband but went to voicemail.  Will inform the oncoming shift to try again for consent for patient's procedure tomorrow.

## 2018-12-25 NOTE — Anesthesia Postprocedure Evaluation (Signed)
Anesthesia Post Note  Patient: [name]  Procedure(s) Performed: BALLOON ENTEROSCOPY (N/A ) ENTEROSCOPY (N/A )     Patient location during evaluation: PACU Anesthesia Type: MAC Level of consciousness: awake and alert Pain management: pain level controlled Vital Signs Assessment: post-procedure vital signs reviewed and stable Respiratory status: spontaneous breathing Cardiovascular status: stable Anesthetic complications: no    Last Vitals:  Vitals:   12/24/18 2018 12/25/18 0429  BP: (!) 110/50 126/64  Pulse: 79 77  Resp: 18 16  Temp: 36.9 C 37 C  SpO2: 98% 97%    Last Pain:  Vitals:   12/25/18 0429  TempSrc: Oral  PainSc:                  Nolon Nations

## 2018-12-25 NOTE — TOC Progression Note (Signed)
Transition of Care [facility]) - Progression Note    Patient Details  Name: JETAUN COLBATH MRN: [id number] Date of Birth: [birth year]  Transition of Care [facility]) CM/SW Contact  Ilya Ess, Juliann Pulse, RN Phone Number: 12/25/2018, 11:39 AM  Clinical Narrative:   Continued medical work up-Bloody BM. Gi cons. Updated [place].    Expected Discharge Plan: Skilled Nursing Facility Barriers to Discharge: Continued Medical Work up  Expected Discharge Plan and Services Expected Discharge Plan: [place] In-house Referral: Clinical Social Work Discharge Planning Services: CM Consult   Living arrangements for the past 2 months: [place] Expected Discharge Date: 12/24/18                                     Social Determinants of Health (SDOH) Interventions    Readmission Risk Interventions Readmission Risk Prevention Plan 12/17/2018 10/26/2018 08/21/2018  Transportation Screening Complete Complete Complete  PCP or Specialist Appt within 5-7 Days - - -  Home Care Screening - - -  Medication Review (RN CM) - - -  [place] or [place] - Complete Complete  Social Work Consult for [place] Planning/Counseling - Not Complete -  SW consult not completed comments - not recovery care -  Palliative Care Screening - Not Applicable Not Applicable  Medication Review (RN Care Manager) Referral to Pharmacy Referral to Pharmacy Complete  PCP or Specialist appointment within 3-5 days of discharge Not Complete - -  PCP/Specialist Appt Not Complete comments DC date unknown but pt is established with providers - -  [place] or [place] Not Complete Complete -  [place] or Home Care Consult Pt Refusal Comments SNF resident - -  SW Recovery Care/Counseling Consult Complete - -  Palliative Care Screening Not Complete Not Applicable -  Comments pending need - -  Skilled Nursing Facility Complete - -  Some recent data might be hidden

## 2018-12-25 NOTE — Progress Notes (Signed)
PROGRESS NOTE    [name]  CBU:[id number] DOB: March 04, 1955 DOA: 12/16/2018 PCP: [place]   Brief Narrative:  Patient is a 46 female with a past medical history of COPD with ongoing tobacco abuse, history of gastric ulcer, AVMs, diabetes mellitus, chronic back pain, CHF, iron deficiency anemia due to chronic blood loss, chronic systolic heart failure, history of coronary artery disease, chronic hepatitis C ,who presents from nursing facility for the evaluation of 2-day history of maroon-colored bloody bowel movements.  She was recently hospitalized here from 11/3-11/20 for GI bleed.  At that time she underwent extensive GI evaluation.  When she presented here this time, her hemoglobin was 4.8.    She has been transfused with PRBCs.  Total of 4 units.  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  APC applied . She was having maroon-colored stools after the procedure.  So she underwent  reenteroscopy on 12/19/18.  Not found to have active bleeding.  She again had maroon-colored liquidy stools on 12/19/18 night .  She underwent  balloon enteroscopy with no finding of active bleeding source.   Since last 3 days, she didnot not have any bloody bowel movements.  Her hemoglobin was stable.  Plan was to discharge to skilled nursing facility today but she just had a moderate amount of bloody bowel movement so discharge canceled.  GI notified.  Check CBC stat  Assessment & Plan:   Principal Problem:   GI bleed Active Problems:   Chronic systolic heart failure (HCC)   Diabetes (HCC)   COPD (chronic obstructive pulmonary disease) (HCC)   Symptomatic anemia   Upper GI bleed   B12 deficiency   Iron deficiency anemia due to chronic blood loss   Folate deficiency   Anxiety   CAD (coronary artery disease)   Chronic back pain   Depression   Diabetes mellitus type 2, uncontrolled (HCC)   GERD (gastroesophageal reflux  disease)   Hyperlipidemia   Hypertension   Obesity   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HCC)   Gastritis and gastroduodenitis   AVM (arteriovenous malformation) of small bowel, acquired with hemorrhage   Acute GI bleed/hematochezia: C  She presented with maroon-colored stools for 2 days from skilled nursing facility.  FOBT positive.  Hemoglobin was 4.8 on admission.  Recently hospitalized this month and underwent GI evaluation with finding of AVM as noted on capsule endoscopy.  She underwent studies with colonoscopy, enteroscopy, capsule study, tagged RBC scan. underwent transfusion with 5 unit of PRBC on last admission.  When she was discharged home her hemoglobin was in the range of 10.  She also has history of gastritis/gastroenteritis. On this admission, she has been transfused with 4 units of PRBC.  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  APC applied . She was having maroon-colored stools after the procedure.  So she underwent  reenteroscopy on 12/19/18.  Not found to have active bleeding.  She again had maroon-colored liquidy stools on 12/19/18 night . She underwent  balloon enteroscopy with no finding of active bleeding source.  Since last 3 days, she didnot not have any bloody bowel movements.  Her hemoglobin was stable.  Plan was to discharge to skilled nursing facility today but she just had a moderate amount of bloody bowel movement so discharge canceled.  GI notified.  Check CBC stat  Chronic systolic heart failure/restrictive  cardiomyopathy/coronary artery disease: Currently  stable.  Denies any chest pain or shortness of breath.  Has trace edema on bilateral lower extremities.  Continue patient's home regimen of Coreg, Aldactone, Demadex, Lipitor.  Monitor input/output  CKD stage 3:Baseline Creatinine fluctuates from 1.5-2.5.  Currently at baseline.  COPD with ongoing tobacco abuse: On Dulera, Spiriva.  Continue  bronchodilators as needed.  History of hepatic encephalopathy: Noted on prior hospitalization.  History of hepatitis C/cirrhosis.  On lactulose.  Ammonia level is normal.Has mild thrombocytopenia due to liver cirrhosis  GERD: Continue PPI  Hypertension: Currently blood pressure stable.  Continue Coreg, spironolactone, Demadex  Diabetes type 2: Well controlled.  Hemoglobin A1c of 5.9 on 06/21/2018.  Continue sliding-scale insulin  Folic acid deficiency: Continue folate.  Debility/deconditioning/confusion at baseline:  Nursing home resident.  She is alert, awake but oriented to place and person only.  She states she ambulates with the help of walker.  Confused on baseline.  History of hepatic encephalopathy but ammonia is normal.  Continue supportive care.  Pressure Injury 12/16/18 Sacrum Mid Stage II -  Partial thickness loss of dermis presenting as a shallow open ulcer with a red, pink wound bed without slough. (Active)  12/16/18 1749  Location: Sacrum  Location Orientation: Mid  Staging: Stage II -  Partial thickness loss of dermis presenting as a shallow open ulcer with a red, pink wound bed without slough.  Wound Description (Comments):   Present on Admission: Yes     Pressure Injury 12/16/18 Heel Right Deep Tissue Injury - Purple or maroon localized area of discolored intact skin or blood-filled blister due to damage of underlying soft tissue from pressure and/or shear. (Active)  12/16/18   Location: Heel  Location Orientation: Right  Staging: Deep Tissue Injury - Purple or maroon localized area of discolored intact skin or blood-filled blister due to damage of underlying soft tissue from pressure and/or shear.  Wound Description (Comments):   Present on Admission: Yes       Nutrition Problem: Inadequate protein intake Etiology: other (see comment)(current diet order)      DVT prophylaxis:SCD Code Status: Full Family Communication: Called spouse on phone multiple  times,connection unsuccessful Disposition Plan: SNF when no more bleeding,when H and H remains stable   Consultants: GI  Procedures: None  Antimicrobials:  Anti-infectives (From admission, onward)   None      Subjective: Patient seen and examined the bedside this morning.  Hemodynamically stable.  Looks comfortable.  Denies any abdomen pain.  She is very poor historian and does not provide any significant information due to her mental status.  Objective: Vitals:   12/24/18 1322 12/24/18 1759 12/24/18 2018 12/25/18 0429  BP: (!) 108/46 103/79 (!) 110/50 126/64  Pulse: 68 72 79 77  Resp: 14  18 16   Temp: 98.6 F (37 C)  98.5 F (36.9 C) 98.6 F (37 C)  TempSrc: Oral  Oral Oral  SpO2: 98%  98% 97%  Weight:    81.6 kg  Height:        Intake/Output Summary (Last 24 hours) at 12/25/2018 1141 Last data filed at 12/25/2018 1000 Gross per 24 hour  Intake 540 ml  Output 400 ml  Net 140 ml   Filed Weights   12/21/18 0451 12/22/18 0447 12/25/18 0429  Weight: 87 kg 85.2 kg 81.6 kg    Examination:  General exam: Not in distress, deconditioned, debilitated HEENT:PERRL, Ear/Nose normal on gross exam Respiratory system: Bilateral equal air entry, normal vesicular breath sounds, no wheezes or crackles  Cardiovascular system: S1 & S2 heard, RRR. No JVD, murmurs, rubs, gallops or clicks.  Gastrointestinal system: Abdomen is nondistended, soft and nontender. No organomegaly or masses felt. Normal bowel sounds heard. Central nervous system: Alert and awake.oriented x2  Extremities:Trace pedal  edema, no clubbing ,no cyanosis, amputation of left great toe  skin: No rashes, lesions ,no icterus ,no pallor,stage 2 sacral ulcer Psychiatry: Judgement and insight appear impaired   Data Reviewed: I have personally reviewed following labs and imaging studies  CBC: Recent Labs  Lab 12/19/18 1440 12/19/18 2252 12/20/18 0542  12/21/18 0426 12/21/18 1642 12/22/18 0540 12/23/18 0519  12/24/18 0444  WBC 6.4 5.0 5.3  --   --   --   --  6.2 7.5  NEUTROABS 4.7 3.5 3.7  --   --   --   --  4.3 5.0  HGB 8.5* 8.5* 7.9*   < > 7.6* 7.8* 7.6* 8.1* 8.1*  HCT 28.1* 28.5* 26.8*   < > 25.6* 25.8* 26.2* 26.5* 26.9*  MCV 94.9 94.4 95.4  --   --   --   --  93.0 92.4  PLT 118* 115* 118*  --   --   --   --  124* 130*   < > = values in this interval not displayed.   Basic Metabolic Panel: Recent Labs  Lab 12/19/18 0202 12/20/18 0542 12/21/18 0426 12/23/18 0519  NA 139 140 140 140  K 4.2 4.0 3.8 3.6  CL 106 110 107 101  CO2 22 21* 23 25  GLUCOSE 107* 118* 94 114*  BUN 51* 45* 37* 29*  CREATININE 2.02* 1.99* 1.93* 1.98*  CALCIUM 8.3* 8.2* 8.5* 8.4*   GFR: Estimated Creatinine Clearance: 30.3 mL/min (A) (by C-G formula based on SCr of 1.98 mg/dL (H)). Liver Function Tests: No results for input(s): AST, ALT, ALKPHOS, BILITOT, PROT, ALBUMIN in the last 168 hours. No results for input(s): LIPASE, AMYLASE in the last 168 hours. No results for input(s): AMMONIA in the last 168 hours. Coagulation Profile: No results for input(s): INR, PROTIME in the last 168 hours. Cardiac Enzymes: No results for input(s): CKTOTAL, CKMB, CKMBINDEX, TROPONINI in the last 168 hours. BNP (last 3 results) No results for input(s): PROBNP in the last 8760 hours. HbA1C: No results for input(s): HGBA1C in the last 72 hours. CBG: Recent Labs  Lab 12/24/18 2015 12/25/18 0009 12/25/18 0423 12/25/18 0738 12/25/18 1106  GLUCAP 141* 184* 147* 128* 253*   Lipid Profile: No results for input(s): CHOL, HDL, LDLCALC, TRIG, CHOLHDL, LDLDIRECT in the last 72 hours. Thyroid Function Tests: No results for input(s): TSH, T4TOTAL, FREET4, T3FREE, THYROIDAB in the last 72 hours. Anemia Panel: No results for input(s): VITAMINB12, FOLATE, FERRITIN, TIBC, IRON, RETICCTPCT in the last 72 hours. Sepsis Labs: No results for input(s): PROCALCITON, LATICACIDVEN in the last 168 hours.  Recent Results (from the past  240 hour(s))  SARS CORONAVIRUS 2 (TAT 6-24 HRS) Nasopharyngeal Nasopharyngeal Swab     Status: None   Collection Time: 12/16/18  2:15 PM   Specimen: Nasopharyngeal Swab  Result Value Ref Range Status   SARS Coronavirus 2 NEGATIVE NEGATIVE Final    Comment: (NOTE) SARS-CoV-2 target nucleic acids are NOT DETECTED. The SARS-CoV-2 RNA is generally detectable in upper and lower respiratory specimens during the acute phase of infection. Negative results do not preclude SARS-CoV-2 infection, do not rule out co-infections with other pathogens, and should not be used as the sole basis for treatment or other patient management decisions.  Negative results must be combined with clinical observations, patient history, and epidemiological information. The expected result is Negative. Fact Sheet for Patients: SugarRoll.be Fact Sheet for Healthcare Providers: https://www.woods-mathews.com/ This test is not yet approved or cleared by the [place] FDA and  has been authorized for detection and/or diagnosis of SARS-CoV-2 by FDA under an Emergency Use Authorization (EUA). This EUA will remain  in effect (meaning this test can be used) for the duration of the COVID-19 declaration under Section 56 4(b)(1) of the Act, 21 U.S.C. section 360bbb-3(b)(1), unless the authorization is terminated or revoked sooner. Performed at [facility], [place] [address] [place], [place] [address]   MRSA PCR Screening     Status: None   Collection Time: 12/16/18  6:15 PM   Specimen: Nasal Mucosa; Nasopharyngeal  Result Value Ref Range Status   MRSA by PCR NEGATIVE NEGATIVE Final    Comment:        The GeneXpert MRSA Assay (FDA approved for NASAL specimens only), is one component of a comprehensive MRSA colonization surveillance program. It is not intended to diagnose MRSA infection nor to guide or monitor treatment for MRSA infections. Performed at [facility], [place] [address] [place], [place] [address]   SARS CORONAVIRUS 2 (TAT 6-24 HRS) Nasopharyngeal Nasopharyngeal Swab     Status: None   Collection Time: 12/23/18  1:56 PM   Specimen: Nasopharyngeal Swab  Result Value Ref Range Status   SARS Coronavirus 2 NEGATIVE NEGATIVE Final    Comment: (NOTE) SARS-CoV-2 target nucleic acids are NOT DETECTED. The SARS-CoV-2 RNA is generally detectable in upper and lower respiratory specimens during the acute phase of infection. Negative results do not preclude SARS-CoV-2 infection, do not rule out co-infections with other pathogens, and should not be used as the sole basis for treatment or other patient management decisions. Negative results must be combined with clinical observations, patient history, and epidemiological information. The expected result is Negative. Fact Sheet for Patients: SugarRoll.be Fact Sheet for Healthcare Providers: https://www.woods-mathews.com/ This test is not yet approved or cleared by the [place] FDA and  has been authorized for detection and/or diagnosis of SARS-CoV-2 by FDA under an Emergency Use Authorization (EUA). This EUA will remain  in effect (meaning this test can be used) for the duration of the COVID-19 declaration under Section 56 4(b)(1) of the Act, 21 U.S.C. section 360bbb-3(b)(1), unless the authorization is terminated or revoked sooner. Performed at [facility], [place] [address], [place], [place] [address]          Radiology Studies: No results found.      Scheduled Meds: . ammonium lactate  1 application Topical BID  . atorvastatin  40 mg Oral q1800  . carvedilol  3.125 mg Oral BID WC  . Chlorhexidine Gluconate Cloth  6 each Topical Daily  . feeding supplement  1 Container Oral TID BM  . feeding supplement (PRO-STAT SUGAR FREE 64)  30 mL Oral BID  . fluticasone  2 spray Each Nare Daily  . folic acid  1 mg Oral  Daily  . insulin aspart  0-9 Units Subcutaneous Q4H  . lactulose  20 g Oral TID  . mirtazapine  30 mg Oral QHS  . multivitamin with minerals  1 tablet Oral Daily  . pantoprazole  40 mg Oral BID  . sodium chloride flush  3 mL Intravenous Q12H  . sodium chloride flush  3 mL Intravenous Q12H  . spironolactone  12.5 mg Oral  Daily  . torsemide  20 mg Oral Daily   Continuous Infusions: . sodium chloride Stopped (12/20/18 0943)  . sodium chloride       LOS: 9 days    Time spent: 35 mins.More than 50% of that time was spent in counseling and/or coordination of care.      Shelly Coss, MD [facility] Pager [phone]  If 7PM-7AM, please contact night-coverage [email or web address] Password [email or web address] 12/25/2018, 11:41 AM

## 2018-12-26 ENCOUNTER — Encounter (HOSPITAL_COMMUNITY)
Admission: EM | Disposition: A | Discharge status: Skilled Nursing Facility | Payer: Self-pay | Source: Skilled Nursing Facility | Attending: Internal Medicine

## 2018-12-26 DIAGNOSIS — R195 Other fecal abnormalities: Secondary | ICD-10-CM

## 2018-12-26 DIAGNOSIS — D649 Anemia, unspecified: Secondary | ICD-10-CM

## 2018-12-26 HISTORY — PX: GIVENS CAPSULE STUDY: SHX5432

## 2018-12-26 LAB — CBC WITH DIFFERENTIAL/PLATELET
Abs Immature Granulocytes: 0.01 10*3/uL (ref 0.00–0.07)
Basophils Absolute: 0.1 10*3/uL (ref 0.0–0.1)
Basophils Relative: 1 %
Eosinophils Absolute: 0.6 10*3/uL — ABNORMAL HIGH (ref 0.0–0.5)
Eosinophils Relative: 7 %
HCT: 25 % — ABNORMAL LOW (ref 36.0–46.0)
Hemoglobin: 7.4 g/dL — ABNORMAL LOW (ref 12.0–15.0)
Immature Granulocytes: 0 %
Lymphocytes Relative: 16 %
Lymphs Abs: 1.2 10*3/uL (ref 0.7–4.0)
MCH: 27.5 pg (ref 26.0–34.0)
MCHC: 29.6 g/dL — ABNORMAL LOW (ref 30.0–36.0)
MCV: 92.9 fL (ref 80.0–100.0)
Monocytes Absolute: 0.9 10*3/uL (ref 0.1–1.0)
Monocytes Relative: 12 %
Neutro Abs: 5 10*3/uL (ref 1.7–7.7)
Neutrophils Relative %: 64 %
Platelets: 128 10*3/uL — ABNORMAL LOW (ref 150–400)
RBC: 2.69 MIL/uL — ABNORMAL LOW (ref 3.87–5.11)
RDW: 16.4 % — ABNORMAL HIGH (ref 11.5–15.5)
WBC: 7.7 10*3/uL (ref 4.0–10.5)
nRBC: 0 % (ref 0.0–0.2)

## 2018-12-26 LAB — BASIC METABOLIC PANEL
Anion gap: 13 (ref 5–15)
BUN: 41 mg/dL — ABNORMAL HIGH (ref 8–23)
CO2: 26 mmol/L (ref 22–32)
Calcium: 8.6 mg/dL — ABNORMAL LOW (ref 8.9–10.3)
Chloride: 102 mmol/L (ref 98–111)
Creatinine, Ser: 1.9 mg/dL — ABNORMAL HIGH (ref 0.44–1.00)
GFR calc Af Amer: 32 mL/min — ABNORMAL LOW (ref >60)
GFR calc non Af Amer: 27 mL/min — ABNORMAL LOW (ref >60)
Glucose, Bld: 126 mg/dL — ABNORMAL HIGH (ref 70–99)
Potassium: 4 mmol/L (ref 3.5–5.1)
Sodium: 141 mmol/L (ref 135–145)

## 2018-12-26 LAB — MAGNESIUM: Magnesium: 1.7 mg/dL (ref 1.7–2.4)

## 2018-12-26 LAB — GLUCOSE, CAPILLARY
Glucose-Capillary: 107 mg/dL — ABNORMAL HIGH (ref 70–99)
Glucose-Capillary: 110 mg/dL — ABNORMAL HIGH (ref 70–99)
Glucose-Capillary: 113 mg/dL — ABNORMAL HIGH (ref 70–99)
Glucose-Capillary: 115 mg/dL — ABNORMAL HIGH (ref 70–99)
Glucose-Capillary: 115 mg/dL — ABNORMAL HIGH (ref 70–99)
Glucose-Capillary: 94 mg/dL (ref 70–99)

## 2018-12-26 LAB — PHOSPHORUS: Phosphorus: 3.5 mg/dL (ref 2.5–4.6)

## 2018-12-26 LAB — SARS CORONAVIRUS 2 (TAT 6-24 HRS): SARS Coronavirus 2: NEGATIVE

## 2018-12-26 SURGERY — IMAGING PROCEDURE, GI TRACT, INTRALUMINAL, VIA CAPSULE
Anesthesia: LOCAL

## 2018-12-26 MED ORDER — BOOST / RESOURCE BREEZE PO LIQD CUSTOM
1.0000 | Freq: Two times a day (BID) | ORAL | Status: DC
  Administered 2018-12-26 – 2019-01-02 (×11): 1 via ORAL

## 2018-12-26 MED ORDER — ENSURE ENLIVE PO LIQD
237.0000 mL | ORAL | Status: DC
  Administered 2018-12-29 – 2019-01-01 (×4): 237 mL via ORAL

## 2018-12-26 SURGICAL SUPPLY — 1 items: TOWEL COTTON PACK 4EA (MISCELLANEOUS) ×4 IMPLANT

## 2018-12-26 NOTE — Progress Notes (Signed)
PROGRESS NOTE    [name]  JYN:[id number] DOB: May 24, 1955 DOA: 12/16/2018 PCP: [facility]   Brief Narrative:  Patient is a 8 female with a past medical history of COPD with ongoing tobacco abuse, history of gastric ulcer, AVMs, diabetes mellitus, chronic back pain, CHF, iron deficiency anemia due to chronic blood loss, chronic systolic heart failure, history of coronary artery disease, chronic hepatitis C ,who presents from nursing facility for the evaluation of 2-day history of maroon-colored bloody bowel movements.  She was recently hospitalized here from 11/3-11/20 for GI bleed.  At that time she underwent extensive GI evaluation.  When she presented here this time, her hemoglobin was 4.8.    She has been transfused with PRBCs.  Total of 4 units.  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  APC applied . She was having maroon-colored stools after the procedure.  So she underwent  reenteroscopy on 12/19/18.  Not found to have active bleeding.  She again had maroon-colored liquidy stools on 12/19/18 night .  She underwent  balloon enteroscopy with no finding of active bleeding source.   For 3 days post balloon enteroscopy , she didnot not have any bloody bowel movements.  Her hemoglobin was stable.  Plan was to discharge to skilled nursing facility on 12/25/18  but she  had a moderate amount of bloody bowel movement on that day so discharge canceled.  GI following. Plan for capsule endoscopy.  Assessment & Plan:   Principal Problem:   GI bleed Active Problems:   Chronic systolic heart failure (HCC)   Diabetes (HCC)   COPD (chronic obstructive pulmonary disease) (HCC)   Symptomatic anemia   Upper GI bleed   B12 deficiency   Iron deficiency anemia due to chronic blood loss   Folate deficiency   Anxiety   CAD (coronary artery disease)   Chronic back pain   Depression   Diabetes mellitus type 2,  uncontrolled (HCC)   GERD (gastroesophageal reflux disease)   Hyperlipidemia   Hypertension   Obesity   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HCC)   Gastritis and gastroduodenitis   AVM (arteriovenous malformation) of small bowel, acquired with hemorrhage   Acute GI bleed/hematochezia: C  She presented with maroon-colored stools for 2 days from skilled nursing facility.  FOBT positive.  Hemoglobin was 4.8 on admission.  Recently hospitalized this month and underwent GI evaluation with finding of AVM as noted on capsule endoscopy.  She underwent studies with colonoscopy, enteroscopy, capsule study, tagged RBC scan. underwent transfusion with 5 unit of PRBC on last admission.  When she was discharged home her hemoglobin was in the range of 10.  She also has history of gastritis/gastroenteritis. On this admission, she has been transfused with 4 units of PRBC.  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  APC applied . She was having maroon-colored stools after the procedure.  So she underwent  reenteroscopy on 12/19/18.  Not found to have active bleeding.  She again had maroon-colored liquidy stools on 12/19/18 night . She underwent  balloon enteroscopy with no finding of active bleeding source.  For 3 days post balloon enteroscopy , she didnot not have any bloody bowel movements.  Her hemoglobin was stable.  Plan was to discharge to skilled nursing facility on 12/25/18  but she  had a moderate amount of bloody bowel movement on that day so discharge canceled.  GI following. Plan for capsule endoscopy.  Chronic systolic heart failure/restrictive  cardiomyopathy/coronary artery disease: Currently stable.  Denies any chest pain or shortness of breath.  Has trace edema on bilateral lower extremities.  Continue patient's home regimen of Coreg, Aldactone, Demadex, Lipitor.  Monitor input/output  CKD stage 3:Baseline Creatinine fluctuates from 1.5-2.5.   Currently at baseline.  COPD with ongoing tobacco abuse: On Dulera, Spiriva.  Continue bronchodilators as needed.  History of hepatic encephalopathy: Noted on prior hospitalization.  History of hepatitis C/cirrhosis.  On lactulose.  Ammonia level is normal.Has mild thrombocytopenia due to liver cirrhosis  GERD: Continue PPI  Hypertension: Currently blood pressure stable.  Continue Coreg, spironolactone, Demadex  Diabetes type 2: Well controlled.  Hemoglobin A1c of 5.9 on 06/21/2018.  Continue sliding-scale insulin  Folic acid deficiency: Continue folate.  Debility/deconditioning/confusion at baseline:  Nursing home resident.  She is alert, awake but oriented to place and person only.  She states she ambulates with the help of walker.  Confused on baseline.  History of hepatic encephalopathy but ammonia is normal.  Continue supportive care.  Pressure Injury 12/16/18 Sacrum Mid Stage II -  Partial thickness loss of dermis presenting as a shallow open ulcer with a red, pink wound bed without slough. (Active)  12/16/18 1749  Location: Sacrum  Location Orientation: Mid  Staging: Stage II -  Partial thickness loss of dermis presenting as a shallow open ulcer with a red, pink wound bed without slough.  Wound Description (Comments):   Present on Admission: Yes     Pressure Injury 12/16/18 Heel Right Deep Tissue Injury - Purple or maroon localized area of discolored intact skin or blood-filled blister due to damage of underlying soft tissue from pressure and/or shear. (Active)  12/16/18   Location: Heel  Location Orientation: Right  Staging: Deep Tissue Injury - Purple or maroon localized area of discolored intact skin or blood-filled blister due to damage of underlying soft tissue from pressure and/or shear.  Wound Description (Comments):   Present on Admission: Yes       Nutrition Problem: Inadequate protein intake Etiology: other (see comment)(current diet order)      DVT  prophylaxis:SCD Code Status: Full Family Communication: Called spouse on phone multiple times,connection unsuccessful Disposition Plan: SNF when GI workup is complete  Consultants: GI  Procedures: None  Antimicrobials:  Anti-infectives (From admission, onward)   None      Subjective: Patient seen and examined the bedside this morning.  Hemodynamically stable.  This morning her hemoglobin was 7.4.  Looks comfortable.  As always, she denies any complaints. she is not aware about her bowel movements.  As per than note from night nurse she had to large, bright red bowel movements  Objective: Vitals:   12/24/18 2018 12/25/18 0429 12/25/18 2020 12/26/18 0427  BP: (!) 110/50 126/64 (!) 104/48 (!) 128/57  Pulse: 79 77 67 70  Resp: 18 16 16 18   Temp: 98.5 F (36.9 C) 98.6 F (37 C) 98.5 F (36.9 C) 98.4 F (36.9 C)  TempSrc: Oral Oral    SpO2: 98% 97% 96% 97%  Weight:  81.6 kg    Height:       No intake or output data in the 24 hours ending 12/26/18 1055 Filed Weights   12/21/18 0451 12/22/18 0447 12/25/18 0429  Weight: 87 kg 85.2 kg 81.6 kg    Examination:  General exam: Not in distress, deconditioned, debilitated HEENT:PERRL, Ear/Nose normal on gross exam Respiratory system: Bilateral equal air entry,  normal vesicular breath sounds, no wheezes or crackles  Cardiovascular system: S1 & S2 heard, RRR. No JVD, murmurs, rubs, gallops or clicks.  Gastrointestinal system: Abdomen is nondistended, soft and nontender. No organomegaly or masses felt. Normal bowel sounds heard. Central nervous system: Alert and awake.oriented x2  Extremities:Trace pedal  edema, no clubbing ,no cyanosis, amputation of left great toe  skin: No rashes, lesions ,no icterus ,no pallor,stage 2 sacral ulcer Psychiatry: Judgement and insight appear impaired   Data Reviewed: I have personally reviewed following labs and imaging studies  CBC: Recent Labs  Lab 12/20/18 0542  12/22/18 0540 12/23/18 0519  12/24/18 0444 12/25/18 1206 12/26/18 0544  WBC 5.3  --   --  6.2 7.5 8.4 7.7  NEUTROABS 3.7  --   --  4.3 5.0 6.3 5.0  HGB 7.9*   < > 7.6* 8.1* 8.1* 7.4* 7.4*  HCT 26.8*   < > 26.2* 26.5* 26.9* 24.7* 25.0*  MCV 95.4  --   --  93.0 92.4 91.8 92.9  PLT 118*  --   --  124* 130* 130* 128*   < > = values in this interval not displayed.   Basic Metabolic Panel: Recent Labs  Lab 12/20/18 0542 12/21/18 0426 12/23/18 0519 12/26/18 0544  NA 140 140 140 141  K 4.0 3.8 3.6 4.0  CL 110 107 101 102  CO2 21* 23 25 26   GLUCOSE 118* 94 114* 126*  BUN 45* 37* 29* 41*  CREATININE 1.99* 1.93* 1.98* 1.90*  CALCIUM 8.2* 8.5* 8.4* 8.6*  MG  --   --   --  1.7  PHOS  --   --   --  3.5   GFR: Estimated Creatinine Clearance: 31.5 mL/min (A) (by C-G formula based on SCr of 1.9 mg/dL (H)). Liver Function Tests: No results for input(s): AST, ALT, ALKPHOS, BILITOT, PROT, ALBUMIN in the last 168 hours. No results for input(s): LIPASE, AMYLASE in the last 168 hours. No results for input(s): AMMONIA in the last 168 hours. Coagulation Profile: No results for input(s): INR, PROTIME in the last 168 hours. Cardiac Enzymes: No results for input(s): CKTOTAL, CKMB, CKMBINDEX, TROPONINI in the last 168 hours. BNP (last 3 results) No results for input(s): PROBNP in the last 8760 hours. HbA1C: No results for input(s): HGBA1C in the last 72 hours. CBG: Recent Labs  Lab 12/25/18 1653 12/25/18 2133 12/26/18 0052 12/26/18 0406 12/26/18 0744  GLUCAP 111* 114* 107* 113* 110*   Lipid Profile: No results for input(s): CHOL, HDL, LDLCALC, TRIG, CHOLHDL, LDLDIRECT in the last 72 hours. Thyroid Function Tests: No results for input(s): TSH, T4TOTAL, FREET4, T3FREE, THYROIDAB in the last 72 hours. Anemia Panel: No results for input(s): VITAMINB12, FOLATE, FERRITIN, TIBC, IRON, RETICCTPCT in the last 72 hours. Sepsis Labs: No results for input(s): PROCALCITON, LATICACIDVEN in the last 168 hours.  Recent Results  (from the past 240 hour(s))  SARS CORONAVIRUS 2 (TAT 6-24 HRS) Nasopharyngeal Nasopharyngeal Swab     Status: None   Collection Time: 12/16/18  2:15 PM   Specimen: Nasopharyngeal Swab  Result Value Ref Range Status   SARS Coronavirus 2 NEGATIVE NEGATIVE Final    Comment: (NOTE) SARS-CoV-2 target nucleic acids are NOT DETECTED. The SARS-CoV-2 RNA is generally detectable in upper and lower respiratory specimens during the acute phase of infection. Negative results do not preclude SARS-CoV-2 infection, do not rule out co-infections with other pathogens, and should not be used as the sole basis for treatment or other patient management  decisions. Negative results must be combined with clinical observations, patient history, and epidemiological information. The expected result is Negative. Fact Sheet for Patients: SugarRoll.be Fact Sheet for Healthcare Providers: https://www.woods-mathews.com/ This test is not yet approved or cleared by the [place] FDA and  has been authorized for detection and/or diagnosis of SARS-CoV-2 by FDA under an Emergency Use Authorization (EUA). This EUA will remain  in effect (meaning this test can be used) for the duration of the COVID-19 declaration under Section 56 4(b)(1) of the Act, 21 U.S.C. section 360bbb-3(b)(1), unless the authorization is terminated or revoked sooner. Performed at [facility], [place] [address] [place], [place] [address]   MRSA PCR Screening     Status: None   Collection Time: 12/16/18  6:15 PM   Specimen: Nasal Mucosa; Nasopharyngeal  Result Value Ref Range Status   MRSA by PCR NEGATIVE NEGATIVE Final    Comment:        The GeneXpert MRSA Assay (FDA approved for NASAL specimens only), is one component of a comprehensive MRSA colonization surveillance program. It is not intended to diagnose MRSA infection nor to guide or monitor treatment for MRSA infections. Performed at  [facility], [place] [address] [place], [place] [address]   SARS CORONAVIRUS 2 (TAT 6-24 HRS) Nasopharyngeal Nasopharyngeal Swab     Status: None   Collection Time: 12/23/18  1:56 PM   Specimen: Nasopharyngeal Swab  Result Value Ref Range Status   SARS Coronavirus 2 NEGATIVE NEGATIVE Final    Comment: (NOTE) SARS-CoV-2 target nucleic acids are NOT DETECTED. The SARS-CoV-2 RNA is generally detectable in upper and lower respiratory specimens during the acute phase of infection. Negative results do not preclude SARS-CoV-2 infection, do not rule out co-infections with other pathogens, and should not be used as the sole basis for treatment or other patient management decisions. Negative results must be combined with clinical observations, patient history, and epidemiological information. The expected result is Negative. Fact Sheet for Patients: SugarRoll.be Fact Sheet for Healthcare Providers: https://www.woods-mathews.com/ This test is not yet approved or cleared by the [place] FDA and  has been authorized for detection and/or diagnosis of SARS-CoV-2 by FDA under an Emergency Use Authorization (EUA). This EUA will remain  in effect (meaning this test can be used) for the duration of the COVID-19 declaration under Section 56 4(b)(1) of the Act, 21 U.S.C. section 360bbb-3(b)(1), unless the authorization is terminated or revoked sooner. Performed at [facility], [place] [address], [place], [place] [address]          Radiology Studies: No results found.      Scheduled Meds: . ammonium lactate  1 application Topical BID  . atorvastatin  40 mg Oral q1800  . carvedilol  3.125 mg Oral BID WC  . Chlorhexidine Gluconate Cloth  6 each Topical Daily  . feeding supplement  1 Container Oral TID BM  . feeding supplement (PRO-STAT SUGAR FREE 64)  30 mL Oral BID  . fluticasone  2 spray Each Nare Daily  . folic acid  1  mg Oral Daily  . insulin aspart  0-9 Units Subcutaneous Q4H  . lactulose  20 g Oral TID  . mirtazapine  30 mg Oral QHS  . multivitamin with minerals  1 tablet Oral Daily  . pantoprazole  40 mg Oral BID  . sodium chloride flush  3 mL Intravenous Q12H  . sodium chloride flush  3 mL Intravenous Q12H  . spironolactone  12.5 mg  Oral Daily  . torsemide  20 mg Oral Daily   Continuous Infusions: . sodium chloride Stopped (12/20/18 0943)  . sodium chloride       LOS: 10 days    Time spent: 35 mins.More than 50% of that time was spent in counseling and/or coordination of care.      Shelly Coss, MD [facility] Pager [phone]  If 7PM-7AM, please contact night-coverage [email or web address] Password [email or web address] 12/26/2018, 10:55 AM

## 2018-12-26 NOTE — Progress Notes (Signed)
Pt had large red mostly liquid stool with fragments of capsule noted from GI procedure.

## 2018-12-26 NOTE — Progress Notes (Signed)
I attempted to call the patient's husband to help him understand the reasoning for our decision to proceed with a video capsule endoscopy. Unable to reach them last night on multiple attempts. This morning I have attempted again and left a message on both the mobile number at home number and have not received a phone call back. Our number to the endoscopy unit which I am in and out of this morning is [phone] and I can be reached at that point or nurses can get a hold of me thereafter.  We are happy to try and talk with the patient husband before we proceed with a potential capsule endoscopy. If we are unable to get a hold of him because of him having expressed concern I am uncomfortable necessarily moving forward with a video capsule endoscopy.  This may delay her care. We may have to consider tagged RBC scan or a CT angiography. We will try to get a hold of the patient's husband later this morning.  you woul  Justice Britain, MD [facility] Gastroenterology Advanced Endoscopy Office # [phone]

## 2018-12-26 NOTE — Progress Notes (Signed)
Pt had 2 more large, bright red BM while finishing her bowel prep this evening. Pt does not have any complaints at this time and vitals stable. Lorra Hals notified via text/page.

## 2018-12-26 NOTE — Progress Notes (Signed)
Tried to reach husband to get consent for VCE. No answer on home phone. I left a VM on mobile stating we would try back later this afternoon.

## 2018-12-26 NOTE — Progress Notes (Signed)
I attempted to call the patient's husband once again and left 2 voicemails on the only number that are listed. At this point we will continue to wait before a video capsule endoscopy is performed on patient's wife. If the patient has significant evidence of GI bleeding we may consider a tagged RBC scan.   Justice Britain, MD [place] [facility] Advanced Endoscopy Office # [phone]

## 2018-12-26 NOTE — Progress Notes (Signed)
Given Capsule Study performed per protocol  Patient swallowed capsule at 2761 without complication tolerated well.  Education given to patient and Diplomatic Services operational officer.  Verbalized understanding.

## 2018-12-26 NOTE — Progress Notes (Signed)
Was able to connect with patient's husband Wandalee Klang ([phone]). 20-minute discussion this morning. We went over patient's clinical status and recent history and endoscopic procedures. We went over VCE results and reasoning for repeat VCE. He understands about the risks of VCE in regards to capsule retention and potential risks associated with this. He has agreed to move forward with VCE. RN Tilden Dome able to confirm patient's husband OK to proceed with VCE.  Justice Britain, MD [place] [facility] Advanced Endoscopy Office # [phone]

## 2018-12-26 NOTE — Progress Notes (Signed)
Nutrition Follow-up  DOCUMENTATION CODES:   Not applicable  INTERVENTION:  - diet re-advancement as medically feasible. - continue Boost Breeze but will decrease from TID to BID; continue 30 ml prostat BID. - will order Ensure Enlive once/day, each supplement provides 350 kcal and 20 grams of protein. - continue to encourage PO intakes.    NUTRITION DIAGNOSIS:   Increased nutrient needs related to wound healing, acute illness as evidenced by estimated needs. -ongoing  GOAL:   Patient will meet greater than or equal to 90% of their needs -progressing  MONITOR:   PO intake, Supplement acceptance, Labs, Weight trends  ASSESSMENT:   45 female with a past medical history of COPD with ongoing tobacco abuse, gastric ulcer, AVMs, DM, chronic back pain, CHF, iron deficiency anemia due to chronic blood loss, CHF, CAD, and chronic hepatitis C. She presented to the ED from nursing facility for 2 day hx of maroon-colored, bloody BMs. She was hospitalized 11/3-11/20 with GIB and underwent extensive GI evaluation at that time. She has been transfused with 4 units PRBCs and GI consulted. She underwent re-enteroscopy on 12/1 and no active bleeding noted.  Diet advanced to FLD on 12/3 at 1717 and then to Regular on 12/5 at 1428. She was made NPO yesterday at 1625. Order in place for Boost Breeze TID and patient has been accepting this supplement ~50% of the time offered. Order in place for 30 ml prostat BID and patient has been accepting this supplement ~!75% of the time offered.  Flow sheet documentation indicates that patient recently consumed the following: 12/6- 50% of all meals (total of 1101 kcal, 49 grams protein) 12/7- 100% of breakfast and 50% of lunch (total of 1151 kcal, 35.5 grams protein) 12/8- 50% of breakfast (441 kcal, 16.5 grams protein)   Per notes: - acute GIB/hematochezia -  plan for video capsule endoscopy today (64/6) - folic acid deficiency - debility/deconditioning -  confusion at baseline--currently a/o to self and place - plan for d/c to SNF when appropriate for d/c   Labs reviewed; BUN: 41 mg/dl, creatinine: 1.9 mg/dl, Ca: 8.6 mg/dl, GFR: 32 ml/min.  Medications reviewed; 510 mg feraheme x1 dose 12/9, 1 mg folvite/day, sliding scale novolog, 20 g lactulose TID, daily multivitamin with minerals, 40 mg oral protonix BID, 12.5 mg aldactone/day.    Diet Order:   Diet Order            Diet NPO time specified  Diet effective 0500        Diet - low sodium heart healthy              EDUCATION NEEDS:   Not appropriate for education at this time  Skin:  Skin Assessment: Skin Integrity Issues: Skin Integrity Issues:: DTI, Stage II DTI: R heel Stage II: sacrum  Last BM:  12/9  Height:   Ht Readings from Last 1 Encounters:  12/19/18 5' 5"  (1.651 m)    Weight:   Wt Readings from Last 1 Encounters:  12/25/18 81.6 kg    Ideal Body Weight:  56.8 kg  BMI:  Body mass index is 29.95 kg/m.  Estimated Nutritional Needs:   Kcal:  1800-2000 kcal  Protein:  90-105 grams  Fluid:  >/= 1.8 L/day     Jarome Matin, MS, RD, LDN, [facility] Inpatient Clinical Dietitian Pager # [phone] After hours/weekend pager # [phone]

## 2018-12-27 DIAGNOSIS — R933 Abnormal findings on diagnostic imaging of other parts of digestive tract: Secondary | ICD-10-CM

## 2018-12-27 DIAGNOSIS — D62 Acute posthemorrhagic anemia: Secondary | ICD-10-CM

## 2018-12-27 LAB — CBC WITH DIFFERENTIAL/PLATELET
Abs Immature Granulocytes: 0.02 10*3/uL (ref 0.00–0.07)
Basophils Absolute: 0.1 10*3/uL (ref 0.0–0.1)
Basophils Relative: 1 %
Eosinophils Absolute: 0.4 10*3/uL (ref 0.0–0.5)
Eosinophils Relative: 6 %
HCT: 24.1 % — ABNORMAL LOW (ref 36.0–46.0)
Hemoglobin: 7.3 g/dL — ABNORMAL LOW (ref 12.0–15.0)
Immature Granulocytes: 0 %
Lymphocytes Relative: 18 %
Lymphs Abs: 1.3 10*3/uL (ref 0.7–4.0)
MCH: 27.9 pg (ref 26.0–34.0)
MCHC: 30.3 g/dL (ref 30.0–36.0)
MCV: 92 fL (ref 80.0–100.0)
Monocytes Absolute: 0.8 10*3/uL (ref 0.1–1.0)
Monocytes Relative: 12 %
Neutro Abs: 4.5 10*3/uL (ref 1.7–7.7)
Neutrophils Relative %: 63 %
Platelets: 132 10*3/uL — ABNORMAL LOW (ref 150–400)
RBC: 2.62 MIL/uL — ABNORMAL LOW (ref 3.87–5.11)
RDW: 16 % — ABNORMAL HIGH (ref 11.5–15.5)
WBC: 7.1 10*3/uL (ref 4.0–10.5)
nRBC: 0 % (ref 0.0–0.2)

## 2018-12-27 LAB — GLUCOSE, CAPILLARY
Glucose-Capillary: 103 mg/dL — ABNORMAL HIGH (ref 70–99)
Glucose-Capillary: 104 mg/dL — ABNORMAL HIGH (ref 70–99)
Glucose-Capillary: 107 mg/dL — ABNORMAL HIGH (ref 70–99)
Glucose-Capillary: 128 mg/dL — ABNORMAL HIGH (ref 70–99)
Glucose-Capillary: 131 mg/dL — ABNORMAL HIGH (ref 70–99)
Glucose-Capillary: 135 mg/dL — ABNORMAL HIGH (ref 70–99)

## 2018-12-27 MED ORDER — BISACODYL 5 MG PO TBEC
10.0000 mg | DELAYED_RELEASE_TABLET | Freq: Once | ORAL | Status: AC
  Administered 2018-12-27: 10 mg via ORAL
  Filled 2018-12-27: qty 2

## 2018-12-27 MED ORDER — PEG-KCL-NACL-NASULF-NA ASC-C 100 G PO SOLR: 1.0000 | Freq: Once | ORAL | Status: DC

## 2018-12-27 MED ORDER — PEG-KCL-NACL-NASULF-NA ASC-C 100 G PO SOLR
0.5000 | Freq: Once | ORAL | Status: AC
  Administered 2018-12-28: 100 g via ORAL

## 2018-12-27 MED ORDER — PEG-KCL-NACL-NASULF-NA ASC-C 100 G PO SOLR
0.5000 | Freq: Once | ORAL | Status: AC
  Administered 2018-12-27: 19:00:00 100 g via ORAL
  Filled 2018-12-27: qty 1

## 2018-12-27 MED ORDER — BISACODYL 5 MG PO TBEC: 10.0000 mg | DELAYED_RELEASE_TABLET | Freq: Every day | ORAL | Status: DC | PRN

## 2018-12-27 NOTE — Progress Notes (Signed)
PROGRESS NOTE    [name]  UEA:[id number] DOB: [birth year] DOA: 12/16/2018 PCP: [place]   Brief Narrative:  Patient is a 17 female with a past medical history of COPD with ongoing tobacco abuse, history of gastric ulcer, AVMs, diabetes mellitus, chronic back pain, CHF, iron deficiency anemia due to chronic blood loss, chronic systolic heart failure, history of coronary artery disease, chronic hepatitis C ,who presents from nursing facility for the evaluation of 2-day history of maroon-colored bloody bowel movements.  She was recently hospitalized here from 11/3-11/20 for GI bleed.  At that time she underwent extensive GI evaluation.  When she presented here this time, her hemoglobin was 4.8.    She has been transfused with PRBCs.  Total of 4 units.  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  APC applied . She was having maroon-colored stools after the procedure.  So she underwent  reenteroscopy on 12/19/18.  Not found to have active bleeding.  She again had maroon-colored liquidy stools on 12/19/18 night .  She underwent  balloon enteroscopy with no finding of active bleeding source.   For 3 days post balloon enteroscopy , she didnot not have any bloody bowel movements.  Her hemoglobin was stable.  Plan was to discharge to skilled nursing facility on 12/25/18  but she  had a moderate amount of bloody bowel movement on that day so discharge canceled. Underwent  capsule endoscopy with finding of bleeding in the area of cecum.  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  FOBT positive.  Hemoglobin was 4.8 on admission.  Recently hospitalized this month and underwent GI evaluation with finding of AVM as noted on capsule endoscopy.  She underwent studies with colonoscopy, enteroscopy, capsule study, tagged RBC scan. underwent transfusion with 5 unit of PRBC on last admission.  When she was discharged home her hemoglobin was in the range of 10.  She also has history of gastritis/gastroenteritis. On this admission, she has been transfused with 4 units of PRBC.  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  APC applied . She was having maroon-colored stools after the procedure.  So she underwent  reenteroscopy on 12/19/18.  Not found to have active bleeding.  She again had maroon-colored liquidy stools on 12/19/18 night . She underwent  balloon enteroscopy with no finding of active bleeding source.  For 3 days post balloon enteroscopy , she didnot not have any bloody bowel movements.  Her hemoglobin was stable.  Plan was to discharge to skilled nursing facility on 12/25/18  but she  had a moderate amount of bloody  bowel movement on that day so discharge canceled. Underwent  capsule endoscopy with finding of bleeding in the area of cecum.  Plan for colonoscopy tomorrow.   Chronic systolic heart failure/restrictive  cardiomyopathy/coronary artery disease: Currently stable.  Denies any chest pain or shortness of breath.  Has trace edema on bilateral lower extremities.  Continue patient's home  regimen of Coreg, Aldactone, Demadex, Lipitor.  Monitor input/output  CKD stage 3:Baseline Creatinine fluctuates from 1.5-2.5.  Currently at baseline.  COPD with ongoing tobacco abuse: On Dulera, Spiriva.  Continue bronchodilators as needed.  History of hepatic encephalopathy: Noted on prior hospitalization.  History of hepatitis C/cirrhosis.  On lactulose.  Ammonia level is normal.Has mild thrombocytopenia due to liver cirrhosis  GERD: Continue PPI  Hypertension: Currently blood pressure stable.  Continue Coreg, spironolactone, Demadex  Diabetes type 2: Well controlled.  Hemoglobin A1c of 5.9 on 06/21/2018.  Continue sliding-scale insulin  Folic acid deficiency: Continue folate.  Debility/deconditioning/confusion at baseline:  Nursing home resident.  She is alert, awake but oriented to place and person only.  She states she ambulates with the help of walker.  Confused on baseline.  History of hepatic encephalopathy but ammonia is normal.  Continue supportive care.  Pressure Injury 12/16/18 Sacrum Mid Stage II -  Partial thickness loss of dermis presenting as a shallow open ulcer with a red, pink wound bed without slough. (Active)  12/16/18 1749  Location: Sacrum  Location Orientation: Mid  Staging: Stage II -  Partial thickness loss of dermis presenting as a shallow open ulcer with a red, pink wound bed without slough.  Wound Description (Comments):   Present on Admission: Yes     Pressure Injury 12/16/18 Heel Right Deep Tissue Injury - Purple or maroon localized area of discolored intact skin or blood-filled blister due to damage of underlying soft tissue from pressure and/or shear. (Active)  12/16/18   Location: Heel  Location Orientation: Right  Staging: Deep Tissue Injury - Purple or maroon localized area of discolored intact skin or blood-filled blister due to damage of underlying soft tissue from pressure and/or shear.  Wound Description (Comments):   Present on Admission: Yes        Nutrition Problem: Increased nutrient needs Etiology: wound healing, acute illness      DVT prophylaxis:SCD Code Status: Full Family Communication: Called spouse on phone multiple times,connection unsuccessful Disposition Plan: Back to SNF when GI workup is complete  Consultants: GI  Procedures: None  Antimicrobials:  Anti-infectives (From admission, onward)   None      Subjective: Patient seen and examined the bedside this morning.  Hemodynamically stable.  As always, she denies any complaints.  No nausea, vomiting or abdominal pain.  She is not aware about bloody bowel movements that she had last night.  Objective: Vitals:   12/26/18 0427 12/26/18 1353 12/26/18 2054 12/27/18 0825  BP: (!) 128/57 135/73 (!) 146/60 (!) 128/59  Pulse: 70 69 66 66  Resp: 18 16 20 14   Temp: 98.4 F (36.9 C) 97.9 F (36.6 C) (!) 97.1 F (36.2 C) 97.7 F (36.5 C)  TempSrc:  Oral    SpO2: 97% 100% 95% 95%  Weight:      Height:        Intake/Output Summary (Last 24 hours) at 12/27/2018 1209 Last data filed at 12/27/2018 0300 Gross per 24 hour  Intake 240 ml  Output 875 ml  Net -635 ml   Filed Weights   12/21/18 0451 12/22/18 0447 12/25/18 0429  Weight:  87 kg 85.2 kg 81.6 kg    Examination:  General exam: Not in distress, deconditioned, debilitated HEENT:PERRL, Ear/Nose normal on gross exam Respiratory system: Bilateral equal air entry, normal vesicular breath sounds, no wheezes or crackles  Cardiovascular system: S1 & S2 heard, RRR. No JVD, murmurs, rubs, gallops or clicks.  Gastrointestinal system: Abdomen is nondistended, soft and nontender. No organomegaly or masses felt. Normal bowel sounds heard. Central nervous system: Alert and awake.oriented x2  Extremities:Trace pedal  edema, no clubbing ,no cyanosis, amputation of left great toe  skin: No rashes, lesions ,no icterus ,no pallor,stage 2 sacral ulcer Psychiatry: Judgement and insight appear impaired   Data  Reviewed: I have personally reviewed following labs and imaging studies  CBC: Recent Labs  Lab 12/23/18 0519 12/24/18 0444 12/25/18 1206 12/26/18 0544 12/27/18 0523  WBC 6.2 7.5 8.4 7.7 7.1  NEUTROABS 4.3 5.0 6.3 5.0 4.5  HGB 8.1* 8.1* 7.4* 7.4* 7.3*  HCT 26.5* 26.9* 24.7* 25.0* 24.1*  MCV 93.0 92.4 91.8 92.9 92.0  PLT 124* 130* 130* 128* 546*   Basic Metabolic Panel: Recent Labs  Lab 12/21/18 0426 12/23/18 0519 12/26/18 0544  NA 140 140 141  K 3.8 3.6 4.0  CL 107 101 102  CO2 23 25 26   GLUCOSE 94 114* 126*  BUN 37* 29* 41*  CREATININE 1.93* 1.98* 1.90*  CALCIUM 8.5* 8.4* 8.6*  MG  --   --  1.7  PHOS  --   --  3.5   GFR: Estimated Creatinine Clearance: 31.5 mL/min (A) (by C-G formula based on SCr of 1.9 mg/dL (H)). Liver Function Tests: No results for input(s): AST, ALT, ALKPHOS, BILITOT, PROT, ALBUMIN in the last 168 hours. No results for input(s): LIPASE, AMYLASE in the last 168 hours. No results for input(s): AMMONIA in the last 168 hours. Coagulation Profile: No results for input(s): INR, PROTIME in the last 168 hours. Cardiac Enzymes: No results for input(s): CKTOTAL, CKMB, CKMBINDEX, TROPONINI in the last 168 hours. BNP (last 3 results) No results for input(s): PROBNP in the last 8760 hours. HbA1C: No results for input(s): HGBA1C in the last 72 hours. CBG: Recent Labs  Lab 12/26/18 1217 12/26/18 1623 12/26/18 2056 12/27/18 0106 12/27/18 0625  GLUCAP 94 115* 115* 135* 128*   Lipid Profile: No results for input(s): CHOL, HDL, LDLCALC, TRIG, CHOLHDL, LDLDIRECT in the last 72 hours. Thyroid Function Tests: No results for input(s): TSH, T4TOTAL, FREET4, T3FREE, THYROIDAB in the last 72 hours. Anemia Panel: No results for input(s): VITAMINB12, FOLATE, FERRITIN, TIBC, IRON, RETICCTPCT in the last 72 hours. Sepsis Labs: No results for input(s): PROCALCITON, LATICACIDVEN in the last 168 hours.  Recent Results (from the past 240 hour(s))  SARS  CORONAVIRUS 2 (TAT 6-24 HRS) Nasopharyngeal Nasopharyngeal Swab     Status: None   Collection Time: 12/23/18  1:56 PM   Specimen: Nasopharyngeal Swab  Result Value Ref Range Status   SARS Coronavirus 2 NEGATIVE NEGATIVE Final    Comment: (NOTE) SARS-CoV-2 target nucleic acids are NOT DETECTED. The SARS-CoV-2 RNA is generally detectable in upper and lower respiratory specimens during the acute phase of infection. Negative results do not preclude SARS-CoV-2 infection, do not rule out co-infections with other pathogens, and should not be used as the sole basis for treatment or other patient management decisions. Negative results must be combined with clinical observations, patient history, and epidemiological information. The expected result is Negative. Fact Sheet for Patients: SugarRoll.be Fact Sheet for Healthcare Providers: https://www.woods-mathews.com/ This test is not  yet approved or cleared by the [place] and  has been authorized for detection and/or diagnosis of SARS-CoV-2 by FDA under an Emergency Use Authorization (EUA). This EUA will remain  in effect (meaning this test can be used) for the duration of the COVID-19 declaration under Section 56 4(b)(1) of the Act, 21 U.S.C. section 360bbb-3(b)(1), unless the authorization is terminated or revoked sooner. Performed at [facility], [place] [address] [place], [place] [address]   SARS CORONAVIRUS 2 (TAT 6-24 HRS) Nasopharyngeal Nasopharyngeal Swab     Status: None   Collection Time: 12/26/18  2:30 PM   Specimen: Nasopharyngeal Swab  Result Value Ref Range Status   SARS Coronavirus 2 NEGATIVE NEGATIVE Final    Comment: (NOTE) SARS-CoV-2 target nucleic acids are NOT DETECTED. The SARS-CoV-2 RNA is generally detectable in upper and lower respiratory specimens during the acute phase of infection. Negative results do not preclude SARS-CoV-2 infection, do not rule out  co-infections with other pathogens, and should not be used as the sole basis for treatment or other patient management decisions. Negative results must be combined with clinical observations, patient history, and epidemiological information. The expected result is Negative. Fact Sheet for Patients: SugarRoll.be Fact Sheet for Healthcare Providers: https://www.woods-mathews.com/ This test is not yet approved or cleared by the [place] FDA and  has been authorized for detection and/or diagnosis of SARS-CoV-2 by FDA under an Emergency Use Authorization (EUA). This EUA will remain  in effect (meaning this test can be used) for the duration of the COVID-19 declaration under Section 56 4(b)(1) of the Act, 21 U.S.C. section 360bbb-3(b)(1), unless the authorization is terminated or revoked sooner. Performed at [facility], [place] [address], [place], [place] [address]          Radiology Studies: No results found.      Scheduled Meds: . ammonium lactate  1 application Topical BID  . atorvastatin  40 mg Oral q1800  . carvedilol  3.125 mg Oral BID WC  . Chlorhexidine Gluconate Cloth  6 each Topical Daily  . feeding supplement  1 Container Oral BID BM  . feeding supplement (ENSURE ENLIVE)  237 mL Oral Q24H  . feeding supplement (PRO-STAT SUGAR FREE 64)  30 mL Oral BID  . fluticasone  2 spray Each Nare Daily  . folic acid  1 mg Oral Daily  . insulin aspart  0-9 Units Subcutaneous Q4H  . lactulose  20 g Oral TID  . mirtazapine  30 mg Oral QHS  . multivitamin with minerals  1 tablet Oral Daily  . pantoprazole  40 mg Oral BID  . peg 3350 powder  0.5 kit Oral Once   And  . [START ON 12/28/2018] peg 3350 powder  0.5 kit Oral Once  . sodium chloride flush  3 mL Intravenous Q12H  . sodium chloride flush  3 mL Intravenous Q12H  . spironolactone  12.5 mg Oral Daily  . torsemide  20 mg Oral Daily   Continuous Infusions: . sodium  chloride Stopped (12/20/18 0943)  . sodium chloride       LOS: 11 days    Time spent: 35 mins.More than 50% of that time was spent in counseling and/or coordination of care.      Shelly Coss, MD [facility] Pager [phone]  If 7PM-7AM, please contact night-coverage [email or web address] Password [email or web address] 12/27/2018, 12:09 PM

## 2018-12-27 NOTE — Progress Notes (Addendum)
Preliminary capsule reading negative for small bowel bleeding. Blood was noted in area of cecum. Plan is for colonoscopy tomorrow. We will discuss with patient's husband.     Attending 54 Attestation   I was able to get a hold of the patient's husband and discussed the preliminary findings on the video capsule endoscopy.  We will plan to proceed with colonoscopy.  I spoke with the patient as well as the patient's husband together on the phone this afternoon and they both agreed to move forward with the procedure.  The colonoscopy will be performed by myself or my partner tomorrow.  Formal/final video capsule endoscopy result will not be available until next week.  I suspect this was a new source of GI bleeding.  The patient and husband are aware that we may get in tomorrow and not find any evidence of bleeding but we will try to do a TI intubation and look at the cecum very closely.  Thankfully, the patient's hemoglobin has been relatively stable.  Justice Britain, MD [facility] Advanced Endoscopy Office # [phone]

## 2018-12-27 NOTE — H&P (View-Only) (Signed)
Preliminary capsule reading negative for small bowel bleeding. Blood was noted in area of cecum. Plan is for colonoscopy tomorrow. We will discuss with patient's husband.     Attending 40 Attestation   I was able to get a hold of the patient's husband and discussed the preliminary findings on the video capsule endoscopy.  We will plan to proceed with colonoscopy.  I spoke with the patient as well as the patient's husband together on the phone this afternoon and they both agreed to move forward with the procedure.  The colonoscopy will be performed by myself or my partner tomorrow.  Formal/final video capsule endoscopy result will not be available until next week.  I suspect this was a new source of GI bleeding.  The patient and husband are aware that we may get in tomorrow and not find any evidence of bleeding but we will try to do a TI intubation and look at the cecum very closely.  Thankfully, the patient's hemoglobin has been relatively stable.  Justice Britain, MD [facility] Advanced Endoscopy Office # [phone]

## 2018-12-27 NOTE — Progress Notes (Signed)
     Progress Note    ASSESSMENT AND PLAN:    63 yo female with recurrent overt .She has had an extensive endoscopic evaluation over the years with findings of both bleeding and non-bleeding AVMs. This admission she underwent 2 small bowel enteroscopies and one balloon enteroscopy. 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 She rebled and repeat enteroscopy on 12/2 showed some ulceration at [facility] site, not actively bleeding but treated like it was the source. No active bleeding on balloon enteroscopy 12/20/18. Was for discharge home a couple of days ago but rebled. Swallowed video capsule, results pending. No bleeding during the night or this am -awaiting capsule studies. Hgb remains stable at 7.4.       SUBJECTIVE   Feels okay. No bleeding today.    OBJECTIVE:     Vital signs in last 24 hours: Temp:  [97.1 F (36.2 C)-97.9 F (36.6 C)] 97.7 F (36.5 C) (12/10 0825) Pulse Rate:  [66-69] 66 (12/10 0825) Resp:  [14-20] 14 (12/10 0825) BP: (128-146)/(59-73) 128/59 (12/10 0825) SpO2:  [95 %-100 %] 95 % (12/10 0825) Last BM Date: 12/26/18 General:   Alert, well-developed female in NAD  Heart:  Regular rate and rhythm; Pulm: Normal respiratory effort   Abdomen:  Soft, nondistended, nontender.  Normal bowel sounds.          Neurologic:  Alert and  oriented x4 Psych:  Pleasant, cooperative.    Intake/Output from previous day: 12/09 0701 - 12/10 0700 In: 240 [P.O.:240] Out: 875 [Urine:875] Intake/Output this shift: No intake/output data recorded.  Lab Results: Recent Labs    12/25/18 1206 12/26/18 0544 12/27/18 0523  WBC 8.4 7.7 7.1  HGB 7.4* 7.4* 7.3*  HCT 24.7* 25.0* 24.1*  PLT 130* 128* 132*   BMET Recent Labs    12/26/18 0544  NA 141  K 4.0  CL 102  CO2 26  GLUCOSE 126*  BUN 41*  CREATININE 1.90*  CALCIUM 8.6*     Principal Problem:   GI bleed Active Problems:   Chronic systolic heart failure (HCC)   Diabetes (HCC)   COPD (chronic  obstructive pulmonary disease) (HCC)   Symptomatic anemia   Upper GI bleed   B12 deficiency   Iron deficiency anemia due to chronic blood loss   Folate deficiency   Anxiety   CAD (coronary artery disease)   Chronic back pain   Depression   Diabetes mellitus type 2, uncontrolled (HCC)   GERD (gastroesophageal reflux disease)   Hyperlipidemia   Hypertension   Obesity   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

## 2018-12-27 NOTE — Progress Notes (Signed)
Physical Therapy Treatment Patient Details Name: [name] MRN: [id number] DOB: Jul 19, 1955 Today's Date: 12/27/2018    History of Present Illness 40 female with a past medical history of COPD with ongoing tobacco abuse, history of gastric ulcer, AVMs, diabetes mellitus, chronic back pain, CHF, iron deficiency anemia due to chronic blood loss, chronic systolic heart failure, history of coronary artery disease, chronic hepatitis C and admitted for Acute GI bleed/hematochezia    PT Comments    Pt agreeable to sit EOB and then requested to use bathroom.  Therapist provided [facility] and assisted with stand pivot to/from [facility].  Pt requiring at least min assist for mobility at this time.  Continue to recommend return to SNF upon d/c.    Follow Up Recommendations  SNF;Supervision/Assistance - 24 hour     Equipment Recommendations  None recommended by PT    Recommendations for Other Services       Precautions / Restrictions Precautions Precautions: Fall    Mobility  Bed Mobility Overal bed mobility: Needs Assistance Bed Mobility: Supine to Sit     Supine to sit: Min guard     General bed mobility comments: pt long sitting in bed and agreeable to sit EOB so released bed rail for pt to rest feet on floor, pt returned to sitting EOB and requested to remain there with upper body laying against elevated HOB (bed alarm activated)  Transfers Overall transfer level: Needs assistance Equipment used: None Transfers: Sit to/from [facility] Sit to Stand: Min assist;+2 safety/equipment Stand pivot transfers: Min assist       General transfer comment: pt requiring UE support however utilized bed rail and BSC armrests for steadying support, min assist also provided for steadying  Ambulation/Gait             General Gait Details: pt declined ambulation today   Stairs             Wheelchair Mobility    Modified Rankin (Stroke Patients Only)        Balance Overall balance assessment: Needs assistance Sitting-balance support: Feet supported;No upper extremity supported Sitting balance-Leahy Scale: Fair     Standing balance support: Bilateral upper extremity supported;During functional activity Standing balance-Leahy Scale: Poor                              Cognition Arousal/Alertness: Awake/alert Behavior During Therapy: Flat affect Overall Cognitive Status: No family/caregiver present to determine baseline cognitive functioning                                 General Comments: pt with improved verbalizations today; asked for PT to "scratch" her back and requested to use bathroom so provided [facility]; pt saying please and thank you      Exercises General Exercises - Lower Extremity Ankle Circles/Pumps: AROM;Both;10 reps Long Arc Quad: AROM;Both;10 reps Hip Flexion/Marching: AROM;Both;10 reps    General Comments        Pertinent Vitals/Pain Pain Assessment: No/denies pain    Home Living                      Prior Function            PT Goals (current goals can now be found in the care plan section) Progress towards PT goals: Progressing toward goals    Frequency    Min  2X/week      PT Plan Current plan remains appropriate    Co-evaluation              AM-PAC PT "6 Clicks" Mobility   Outcome Measure  Help needed turning from your back to your side while in a flat bed without using bedrails?: A Little Help needed moving from lying on your back to sitting on the side of a flat bed without using bedrails?: A Little Help needed moving to and from a bed to a chair (including a wheelchair)?: A Little Help needed standing up from a chair using your arms (e.g., wheelchair or bedside chair)?: A Little Help needed to walk in hospital room?: A Lot Help needed climbing 3-5 steps with a railing? : Total 6 Click Score: 15    End of Session   Activity Tolerance: Patient  tolerated treatment well Patient left: in bed;with call bell/phone within reach;with bed alarm set Nurse Communication: Mobility status PT Visit Diagnosis: Other abnormalities of gait and mobility (R26.89);Muscle weakness (generalized) (M62.81)     Time: 0131-4388 PT Time Calculation (min) (ACUTE ONLY): 17 min  Charges:  $Therapeutic Activity: 8-22 mins                     Arlyce Dice, DPT Acute Rehabilitation Services Office: [phone]   York Ram E 12/27/2018, 3:14 PM

## 2018-12-28 ENCOUNTER — Inpatient Hospital Stay (HOSPITAL_COMMUNITY): Payer: Medicaid Other | Admitting: Anesthesiology

## 2018-12-28 ENCOUNTER — Encounter (HOSPITAL_COMMUNITY)
Admission: EM | Disposition: A | Discharge status: Skilled Nursing Facility | Payer: Self-pay | Source: Skilled Nursing Facility | Attending: Internal Medicine

## 2018-12-28 ENCOUNTER — Encounter: Payer: Self-pay | Admitting: *Deleted

## 2018-12-28 HISTORY — PX: COLONOSCOPY WITH PROPOFOL: SHX5780

## 2018-12-28 HISTORY — PX: HOT HEMOSTASIS: SHX5433

## 2018-12-28 HISTORY — PX: SUBMUCOSAL TATTOO INJECTION: SHX6856

## 2018-12-28 HISTORY — PX: HEMOSTASIS CLIP PLACEMENT: SHX6857

## 2018-12-28 LAB — CBC WITH DIFFERENTIAL/PLATELET
Abs Immature Granulocytes: 0.01 10*3/uL (ref 0.00–0.07)
Basophils Absolute: 0.1 10*3/uL (ref 0.0–0.1)
Basophils Relative: 1 %
Eosinophils Absolute: 0.3 10*3/uL (ref 0.0–0.5)
Eosinophils Relative: 6 %
HCT: 27.3 % — ABNORMAL LOW (ref 36.0–46.0)
Hemoglobin: 8.1 g/dL — ABNORMAL LOW (ref 12.0–15.0)
Immature Granulocytes: 0 %
Lymphocytes Relative: 18 %
Lymphs Abs: 1.1 10*3/uL (ref 0.7–4.0)
MCH: 27 pg (ref 26.0–34.0)
MCHC: 29.7 g/dL — ABNORMAL LOW (ref 30.0–36.0)
MCV: 91 fL (ref 80.0–100.0)
Monocytes Absolute: 0.6 10*3/uL (ref 0.1–1.0)
Monocytes Relative: 10 %
Neutro Abs: 3.8 10*3/uL (ref 1.7–7.7)
Neutrophils Relative %: 65 %
Platelets: 149 10*3/uL — ABNORMAL LOW (ref 150–400)
RBC: 3 MIL/uL — ABNORMAL LOW (ref 3.87–5.11)
RDW: 15.9 % — ABNORMAL HIGH (ref 11.5–15.5)
WBC: 5.9 10*3/uL (ref 4.0–10.5)
nRBC: 0 % (ref 0.0–0.2)

## 2018-12-28 LAB — GLUCOSE, CAPILLARY
Glucose-Capillary: 110 mg/dL — ABNORMAL HIGH (ref 70–99)
Glucose-Capillary: 119 mg/dL — ABNORMAL HIGH (ref 70–99)
Glucose-Capillary: 99 mg/dL (ref 70–99)
Glucose-Capillary: 149 mg/dL — ABNORMAL HIGH (ref 70–99)

## 2018-12-28 SURGERY — COLONOSCOPY WITH PROPOFOL
Anesthesia: Monitor Anesthesia Care

## 2018-12-28 MED ORDER — LIDOCAINE HCL (CARDIAC) PF 100 MG/5ML IV SOSY
PREFILLED_SYRINGE | INTRAVENOUS | Status: DC | PRN
  Administered 2018-12-28: 80 mg via INTRAVENOUS

## 2018-12-28 MED ORDER — LACTATED RINGERS IV SOLN
INTRAVENOUS | Status: DC | PRN
  Administered 2018-12-28: 15:00:00 via INTRAVENOUS

## 2018-12-28 MED ORDER — SPOT INK MARKER SYRINGE KIT
PACK | SUBMUCOSAL | Status: DC | PRN
  Administered 2018-12-28: 5 mL via SUBMUCOSAL

## 2018-12-28 MED ORDER — PROPOFOL 500 MG/50ML IV EMUL
INTRAVENOUS | Status: DC | PRN
  Administered 2018-12-28: 110 ug/kg/min via INTRAVENOUS
  Administered 2018-12-28: 30 mg via INTRAVENOUS

## 2018-12-28 MED ORDER — SPOT INK MARKER SYRINGE KIT
PACK | SUBMUCOSAL | Status: AC
  Filled 2018-12-28: qty 5

## 2018-12-28 MED ORDER — LACTATED RINGERS IV SOLN: INTRAVENOUS | Status: DC | PRN

## 2018-12-28 MED ORDER — PHENYLEPHRINE HCL (PRESSORS) 10 MG/ML IV SOLN
INTRAVENOUS | Status: DC | PRN
  Administered 2018-12-28 (×6): 80 ug via INTRAVENOUS

## 2018-12-28 MED ORDER — PROPOFOL 10 MG/ML IV BOLUS
INTRAVENOUS | Status: AC
  Filled 2018-12-28: qty 20

## 2018-12-28 MED ORDER — EPHEDRINE SULFATE 50 MG/ML IJ SOLN
INTRAMUSCULAR | Status: DC | PRN
  Administered 2018-12-28 (×2): 5 mg via INTRAVENOUS
  Administered 2018-12-28: 10 mg via INTRAVENOUS

## 2018-12-28 SURGICAL SUPPLY — 22 items

## 2018-12-28 NOTE — Transfer of Care (Signed)
Immediate Anesthesia Transfer of Care Note  Patient: [name]  Procedure(s) Performed: COLONOSCOPY WITH PROPOFOL (N/A ) SUBMUCOSAL TATTOO INJECTION HOT HEMOSTASIS (ARGON PLASMA COAGULATION/BICAP) (N/A ) HEMOSTASIS CLIP PLACEMENT  Patient Location: PACU and Endoscopy Unit  Anesthesia Type:MAC  Level of Consciousness: awake, alert , oriented and patient cooperative  Airway & Oxygen Therapy: Patient Spontanous Breathing and Patient connected to face mask oxygen  Post-op Assessment: Report given to RN, Post -op Vital signs reviewed and stable and Patient moving all extremities  Post vital signs: Reviewed and stable  Last Vitals:  Vitals Value Taken Time  BP 106/33 12/28/18 1520  Temp    Pulse 61 12/28/18 1522  Resp 19 12/28/18 1522  SpO2 100 % 12/28/18 1522  Vitals shown include unvalidated device data.  Last Pain:  Vitals:   12/28/18 1301  TempSrc: Oral  PainSc: 0-No pain         Complications: No apparent anesthesia complications

## 2018-12-28 NOTE — Anesthesia Preprocedure Evaluation (Signed)
Anesthesia Evaluation  Patient identified by MRN, date of birth, ID band Patient awake    Reviewed: Allergy & Precautions, H&P , NPO status , Patient's Chart, lab work & pertinent test results  Airway Mallampati: II   Neck ROM: full    Dental   Pulmonary sleep apnea , COPD, Current Smoker,    breath sounds clear to auscultation       Cardiovascular hypertension, + CAD, + Past MI and +CHF   Rhythm:regular Rate:Normal  TEE (10/2018): EF 50%. No vegetation seen. Mod MR, Mod TR.    Neuro/Psych PSYCHIATRIC DISORDERS Anxiety Depression  Neuromuscular disease    GI/Hepatic PUD, GERD  ,(+) Cirrhosis       , Hepatitis -, C  Endo/Other  diabetes, Type 2  Renal/GU Renal InsufficiencyRenal disease     Musculoskeletal   Abdominal   Peds  Hematology   Anesthesia Other Findings   Reproductive/Obstetrics                            Anesthesia Physical Anesthesia Plan  ASA: III  Anesthesia Plan: MAC   Post-op Pain Management:    Induction: Intravenous  PONV Risk Score and Plan: 1 and Propofol infusion, Ondansetron and Treatment may vary due to age or medical condition  Airway Management Planned: Simple Face Mask  Additional Equipment:   Intra-op Plan:   Post-operative Plan:   Informed Consent: I have reviewed the patients History and Physical, chart, labs and discussed the procedure including the risks, benefits and alternatives for the proposed anesthesia with the patient or authorized representative who has indicated his/her understanding and acceptance.       Plan Discussed with: CRNA, Anesthesiologist and Surgeon  Anesthesia Plan Comments:         Anesthesia Quick Evaluation

## 2018-12-28 NOTE — Progress Notes (Addendum)
Video capsule endoscopy --image interpretation performed.  Mucosal tattoos, 2 separate tattoos found in the jejunum Otherwise unremarkable small bowel with no evidence for bleeding. Fresh blood visible immediately in the cecum without source.  Views of the colonic mucosa obscured by blood in stool  See colonoscopy report from today Formal report will be scanned into medical record

## 2018-12-28 NOTE — Progress Notes (Signed)
Patient finished all of movi prep by 4am and drank maybe 4-6 oz of water, but refused to drink the full 16oz ordered after moviprep. Will continue to monitor stool.

## 2018-12-28 NOTE — Progress Notes (Signed)
PROGRESS NOTE    [name]  VFI:[id number] DOB: 1955-02-08 DOA: 12/16/2018 PCP: [facility]   Brief Narrative:   Patient is a 1 female with a past medical history of COPD with ongoing tobacco abuse, history of gastric ulcer, AVMs, diabetes mellitus, chronic back pain, CHF, iron deficiency anemia due to chronic blood loss, chronic systolic heart failure, history of coronary artery disease, chronic hepatitis C ,who presents from nursing facility for the evaluation of 2-day history of maroon-colored bloody bowel movements.  She was recently hospitalized here from 11/3-11/20 for GI bleed.  At that time she underwent extensive GI evaluation.  When she presented here this time, her hemoglobin was 4.8.    She has been transfused with PRBCs.  Total of 4 units.  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  APC applied . She was having maroon-colored stools after the procedure.  So she underwent  reenteroscopy on 12/19/18.  Not found to have active bleeding.  She again had maroon-colored liquidy stools on 12/19/18 night .  She underwent  balloon enteroscopy with no finding of active bleeding source.   For 3 days post balloon enteroscopy , she didnot not have any bloody bowel movements.  Her hemoglobin was stable.  Plan was to discharge to skilled nursing facility on 12/25/18  but she  had a moderate amount of bloody bowel movement on that day so discharge canceled. Underwent  capsule endoscopy with finding of bleeding in the area of cecum.  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  FOBT positive.  Hemoglobin was 4.8 on admission.  Recently hospitalized this month and underwent GI evaluation with finding of AVM as noted on capsule endoscopy.  She underwent studies with colonoscopy, enteroscopy, capsule study, tagged RBC scan. underwent transfusion with 5 unit of PRBC on last admission.  When she was discharged home her hemoglobin was in the range of 10.  She also has history of gastritis/gastroenteritis. On this admission, she has been transfused with 4 units of PRBC. 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  APC applied . She was having maroon-colored stools after the procedure.  So she underwent  reenteroscopy on 12/19/18.  Not found to have active bleeding.  She again had maroon-colored liquidy stools on 12/19/18 night . She underwent  balloon enteroscopy with no finding of active bleeding source.  For 3 days post balloon enteroscopy , she didnot not have any bloody bowel movements.  Her hemoglobin was stable.  Plan was to discharge to skilled nursing facility on 12/25/18  but she  had a moderate amount of  bloody bowel movement on that day so discharge canceled. Underwent  capsule endoscopy with finding of bleeding in the area of cecum.  Plan for colonoscopy today.  # Chronic systolic heart failure/restrictive  cardiomyopathy/coronary artery disease: Currently stable.  Denies any chest pain or shortness of breath.  Has trace edema on bilateral lower extremities.  Continue patient's home  regimen of Coreg, Aldactone, Demadex, Lipitor.  Monitor input/output  # CKD stage 3:Baseline Creatinine fluctuates from 1.5-2.5.  Currently at baseline.  # COPD with ongoing tobacco abuse: On Dulera, Spiriva.  Continue bronchodilators as needed.  # History of hepatic encephalopathy: Noted on prior hospitalization.  History of hepatitis C/cirrhosis.  On lactulose.  Ammonia level is normal.Has mild thrombocytopenia due to liver cirrhosis  # GERD: Continue PPI  # Hypertension: Currently blood pressure stable.  Continue Coreg, spironolactone, Demadex  Diabetes type 2: Well controlled.  Hemoglobin A1c of 5.9 on 06/21/2018.  Continue sliding-scale insulin  Folic acid deficiency: Continue folate.  Debility/deconditioning/confusion at baseline:  Nursing home resident.  She is alert, awake but oriented to place and person only.  She states she ambulates with the help of walker.  Confused on baseline.  History of hepatic encephalopathy but ammonia is normal.  Continue supportive care.  Pressure Injury 12/16/18 Sacrum Mid Stage II -  Partial thickness loss of dermis presenting as a shallow open ulcer with a red, pink wound bed without slough. (Active)  12/16/18 1749  Location: Sacrum  Location Orientation: Mid  Staging: Stage II -  Partial thickness loss of dermis presenting as a shallow open ulcer with a red, pink wound bed without slough.  Wound Description (Comments):   Present on Admission: Yes     Pressure Injury 12/16/18 Heel Right Deep Tissue Injury - Purple or maroon localized area of discolored intact skin or blood-filled blister due to damage of underlying soft tissue from pressure and/or shear. (Active)  12/16/18   Location: Heel  Location Orientation: Right  Staging: Deep Tissue Injury - Purple or maroon localized area of discolored intact skin or blood-filled blister due to damage of underlying soft tissue from pressure and/or shear.  Wound Description (Comments):   Present on  Admission: Yes       Nutrition Problem: Increased nutrient needs Etiology: wound healing, acute illness    DVT prophylaxis:SCD Code Status: Full Family Communication: Called spouse on phone multiple times,connection unsuccessful Disposition Plan: Back to SNF when GI workup is complete  Consultants: GI    Procedures:  Capsule endoscopy, colonoscopy  Antimicrobials;  Anti-infectives (From admission, onward)   None     Subjective: Patient was seen and examined at bedside, he is hemodynamically stable.  He denies any nausea vomiting or abdominal pain he also denies any bloody bowel movement last couple of days.  Objective: Vitals:   12/27/18 2012 12/28/18 0354 12/28/18 0922 12/28/18 1301  BP: 124/65 124/63 130/70 134/69  Pulse: 63 74 70 71  Resp: 16 20  15   Temp: 97.6 F (36.4 C) (!) 97.4 F (36.3 C)  97.6 F (36.4 C)  TempSrc: Oral   Oral  SpO2: 99% 100%  100%  Weight:      Height:       No intake or output data in the 24 hours ending 12/28/18 1352 Filed Weights   12/21/18 0451 12/22/18 0447 12/25/18 0429  Weight: 87 kg 85.2 kg 81.6 kg    Examination:  General exam: Appears calm and comfortable, Appears deconditioned,  debilitated Respiratory system: Clear to auscultation. Respiratory  effort normal. Cardiovascular system: S1 & S2 heard, RRR. No JVD, murmurs, rubs, gallops or clicks. No pedal edema. Gastrointestinal system: Abdomen is nondistended, soft and nontender. No organomegaly or masses felt. Normal bowel sounds heard. Central nervous system: Alert and oriented. No focal neurological deficits. 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 Skin: No rashes, lesions or ulcers Psychiatry: Judgement and insight appear normal. Mood & affect appropriate.     Data Reviewed: I have personally reviewed following labs and imaging studies  CBC: Recent Labs  Lab 12/24/18 0444 12/25/18 1206  12/26/18 0544 12/27/18 0523 12/28/18 0523  WBC 7.5 8.4 7.7 7.1 5.9  NEUTROABS 5.0 6.3 5.0 4.5 3.8  HGB 8.1* 7.4* 7.4* 7.3* 8.1*  HCT 26.9* 24.7* 25.0* 24.1* 27.3*  MCV 92.4 91.8 92.9 92.0 91.0  PLT 130* 130* 128* 132* 578*   Basic Metabolic Panel: Recent Labs  Lab 12/23/18 0519 12/26/18 0544  NA 140 141  K 3.6 4.0  CL 101 102  CO2 25 26  GLUCOSE 114* 126*  BUN 29* 41*  CREATININE 1.98* 1.90*  CALCIUM 8.4* 8.6*  MG  --  1.7  PHOS  --  3.5   GFR: Estimated Creatinine Clearance: 31.5 mL/min (A) (by C-G formula based on SCr of 1.9 mg/dL (H)). Liver Function Tests: No results for input(s): AST, ALT, ALKPHOS, BILITOT, PROT, ALBUMIN in the last 168 hours. No results for input(s): LIPASE, AMYLASE in the last 168 hours. No results for input(s): AMMONIA in the last 168 hours. Coagulation Profile: No results for input(s): INR, PROTIME in the last 168 hours. Cardiac Enzymes: No results for input(s): CKTOTAL, CKMB, CKMBINDEX, TROPONINI in the last 168 hours. BNP (last 3 results) No results for input(s): PROBNP in the last 8760 hours. HbA1C: No results for input(s): HGBA1C in the last 72 hours. CBG: Recent Labs  Lab 12/27/18 1612 12/27/18 2008 12/27/18 2338 12/28/18 0351 12/28/18 1150  GLUCAP 107* 104* 103* 110* 119*   Lipid Profile: No results for input(s): CHOL, HDL, LDLCALC, TRIG, CHOLHDL, LDLDIRECT in the last 72 hours. Thyroid Function Tests: No results for input(s): TSH, T4TOTAL, FREET4, T3FREE, THYROIDAB in the last 72 hours. Anemia Panel: No results for input(s): VITAMINB12, FOLATE, FERRITIN, TIBC, IRON, RETICCTPCT in the last 72 hours. Sepsis Labs: No results for input(s): PROCALCITON, LATICACIDVEN in the last 168 hours.  Recent Results (from the past 240 hour(s))  SARS CORONAVIRUS 2 (TAT 6-24 HRS) Nasopharyngeal Nasopharyngeal Swab     Status: None   Collection Time: 12/23/18  1:56 PM   Specimen: Nasopharyngeal Swab  Result Value Ref Range Status   SARS  Coronavirus 2 NEGATIVE NEGATIVE Final    Comment: (NOTE) SARS-CoV-2 target nucleic acids are NOT DETECTED. The SARS-CoV-2 RNA is generally detectable in upper and lower respiratory specimens during the acute phase of infection. Negative results do not preclude SARS-CoV-2 infection, do not rule out co-infections with other pathogens, and should not be used as the sole basis for treatment or other patient management decisions. Negative results must be combined with clinical observations, patient history, and epidemiological information. The expected result is Negative. Fact Sheet for Patients: SugarRoll.be Fact Sheet for Healthcare Providers: https://www.woods-mathews.com/ This test is not yet approved or cleared by the [place] FDA and  has been authorized for detection and/or diagnosis of SARS-CoV-2 by FDA under an Emergency Use Authorization (EUA). This EUA will remain  in effect (meaning this test can be used) for the duration of the  COVID-19 declaration under Section 56 4(b)(1) of the Act, 21 U.S.C. section 360bbb-3(b)(1), unless the authorization is terminated or revoked sooner. Performed at [facility], [place] [address], [place], [place] [address]   SARS CORONAVIRUS 2 (TAT 6-24 HRS) Nasopharyngeal Nasopharyngeal Swab     Status: None   Collection Time: 12/26/18  2:30 PM   Specimen: Nasopharyngeal Swab  Result Value Ref Range Status   SARS Coronavirus 2 NEGATIVE NEGATIVE Final    Comment: (NOTE) SARS-CoV-2 target nucleic acids are NOT DETECTED. The SARS-CoV-2 RNA is generally detectable in upper and lower respiratory specimens during the acute phase of infection. Negative results do not preclude SARS-CoV-2 infection, do not rule out co-infections with other pathogens, and should not be used as the sole basis for treatment or other patient management decisions. Negative results must be combined with clinical  observations, patient history, and epidemiological information. The expected result is Negative. Fact Sheet for Patients: SugarRoll.be Fact Sheet for Healthcare Providers: https://www.woods-mathews.com/ This test is not yet approved or cleared by the [place] FDA and  has been authorized for detection and/or diagnosis of SARS-CoV-2 by FDA under an Emergency Use Authorization (EUA). This EUA will remain  in effect (meaning this test can be used) for the duration of the COVID-19 declaration under Section 56 4(b)(1) of the Act, 21 U.S.C. section 360bbb-3(b)(1), unless the authorization is terminated or revoked sooner. Performed at [facility], [place] [address], [place], [place] [address]          Radiology Studies: No results found.      Scheduled Meds: . [MAR Hold] ammonium lactate  1 application Topical BID  . [MAR Hold] atorvastatin  40 mg Oral q1800  . [MAR Hold] carvedilol  3.125 mg Oral BID WC  . [MAR Hold] Chlorhexidine Gluconate Cloth  6 each Topical Daily  . [MAR Hold] feeding supplement  1 Container Oral BID BM  . [MAR Hold] feeding supplement (ENSURE ENLIVE)  237 mL Oral Q24H  . [MAR Hold] feeding supplement (PRO-STAT SUGAR FREE 64)  30 mL Oral BID  . [MAR Hold] fluticasone  2 spray Each Nare Daily  . [MAR Hold] folic acid  1 mg Oral Daily  . [MAR Hold] insulin aspart  0-9 Units Subcutaneous Q4H  . [MAR Hold] lactulose  20 g Oral TID  . [MAR Hold] mirtazapine  30 mg Oral QHS  . [MAR Hold] multivitamin with minerals  1 tablet Oral Daily  . [MAR Hold] pantoprazole  40 mg Oral BID  . [MAR Hold] sodium chloride flush  3 mL Intravenous Q12H  . [MAR Hold] sodium chloride flush  3 mL Intravenous Q12H  . [MAR Hold] spironolactone  12.5 mg Oral Daily  . [MAR Hold] torsemide  20 mg Oral Daily   Continuous Infusions: . [MAR Hold] sodium chloride Stopped (12/20/18 0943)  . [MAR Hold] sodium chloride       LOS: 12  days    Time spent:     Shawna Clamp, MD [facility]   If 7PM-7AM, please contact night-coverage

## 2018-12-28 NOTE — Interval H&P Note (Signed)
History and Physical Interval Note:  12/28/2018 1:45 PM  [name]  has presented today for surgery, with the diagnosis of gastrointestinal bleeding.  The various methods of treatment have been discussed with the patient and family. After consideration of risks, benefits and other options for treatment, the patient has consented to  Procedure(s): COLONOSCOPY WITH PROPOFOL (N/A) as a surgical intervention.  The patient's history has been reviewed, patient examined, no change in status, stable for surgery.  I have reviewed the patient's chart and labs.  Questions were answered to the patient's satisfaction.     [employer]

## 2018-12-28 NOTE — Op Note (Signed)
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 Bonna Gains MD, MD,                            Carlota Raspberry. Havery Moros, MD, Gerrit Heck, MD Medicines:                Monitored Anesthesia Care Complications:            No immediate complications. Estimated Blood Loss:     Estimated blood loss: none. Procedure:                Pre-Anesthesia Assessment:                           - Prior to the procedure, a History and Physical                            was performed, and patient medications and                            allergies were reviewed. The patient's tolerance of                            previous anesthesia was also reviewed. The risks                            and benefits of the procedure and the sedation                            options and risks were discussed with the patient.  All questions were answered, and informed consent                            was obtained. Prior Anticoagulants: The patient has                            taken no previous anticoagulant or antiplatelet                            agents. ASA Grade Assessment: III - A patient with   severe systemic disease. After reviewing the risks                            and benefits, the patient was deemed in                            satisfactory condition to undergo the procedure.                           After obtaining informed consent, the colonoscope                            was passed under direct vision. Throughout the                            procedure, the patient's blood pressure, pulse, and                            oxygen saturations were monitored continuously. The                            PCF-H190DL ([id number] Olympus pediatric colonscope                            was introduced through the anus and advanced to the                            25 cm into the ileum. The colonoscopy was performed                            without difficulty. The patient tolerated the                            procedure. The quality of the bowel preparation was                            adequate. The terminal ileum, ileocecal valve,                            appendiceal orifice, and rectum were photographed. Scope In: 2:17:27 PM Scope Out: 3:09:24 PM Scope Withdrawal Time: 0 hours 47 minutes 5 seconds  Total Procedure Duration: 0 hours 51 minutes 57 seconds  Findings:      The digital rectal exam findings include hemorrhoids. Pertinent  negatives include no palpable rectal lesions.      Red blood and clot was found in the entire colon. Lavage of the area was       performed using copious amounts, resulting in clearance with adequate       visualization.      The distal ileum, terminal ileum and ileocecal valve appeared normal.       Area was tattooed with an injection of Spot (carbon black).      Multiple sessile polyps were found in the hepatic flexure, ascending       colon and cecum (at least 5) The polyps were 2 to 6 mm in size.       Polypectomy was not attempted due to intention of procedure and to no       mix the picture of bleeding source.      A 30 mm  polyp was found in the proximal ascending colon (4 folds from IC       Valve. The polyp was granular lateral spreading. Polypectomy was not       attempted due to intention of procedure and to no mix the picture of       bleeding source.      Two small angioectasias with active oozing/bleeding were found in the       transverse colon. Fulguration to ablate the lesion by argon plasma was       successful. To prevent bleeding post-intervention, six hemostatic clips       were successfully placed (MR conditional) (3 on each of the regions       coagulated). There was no bleeding at the end of the procedure. Tattoo       placed on contralateral wall to help demarcate in future.      Non-bleeding non-thrombosed external and internal hemorrhoids were found       during retroflexion, during perianal exam and during digital exam. The       hemorrhoids were Grade II (internal hemorrhoids that prolapse but reduce       spontaneously). Impression:               - Hemorrhoids found on digital rectal exam.                           - Blood in the entire examined colon. Lavaged with                            adequate visualization.                           - The examined portion of the ileum was normal.                            Tattooed.                           - Multiple 2 to 6 mm polyps at the hepatic flexure,                            in the ascending colon and in the cecum. Resection  not attempted.                           - One 30 mm polyp in the proximal ascending colon.                            Resection not attempted.                           - Two bleeding colonic angioectasias. Treated with                            argon plasma coagulation (APC). Clips (MR                            conditional) were placed.                           - Non-bleeding non-thrombosed external and internal                            hemorrhoids. Moderate Sedation:      Not  Applicable - Patient had care per Anesthesia. Recommendation:           - The patient will be observed post-procedure,                            until all discharge criteria are met.                           - Return patient to hospital ward for ongoing care.                           - Advance diet as tolerated.                           - Continue present medications.                           - Avoid Chemical VTE PPx for next 48 hours.                           - Trend Hgb/Hct.                           - If patient has evidence of rebleeding, I think,                            repeat Colonoscopy to evaluate the region should be                            undertaken and if there is no bleeding then repeat                            VCE performed.                           -  Patient will require outpatient clinic visit for                            follow up (to discuss consideration of Advanced                            Polypectomy/resection of the aforementioned large                            AC polyp. I am willing to do this as a referral,                            but I will not take over her care as her main                            Gastroenterologist. This will need to be discussed                            with patient's husband and patient at future time                            in greater depth.                           - The findings and recommendations were discussed                            with the patient.                           - The findings and recommendations were discussed                            with the patient's family.                           - The findings and recommendations were discussed                            with the referring physician. Procedure Code(s):        --- Professional ---                           [phone], Colonoscopy, flexible; with control of                            bleeding, any method                            45381, 59, Colonoscopy, flexible; with directed                            submucosal injection(s), any substance Diagnosis Code(s):        --- Professional ---  K64.1, Second degree hemorrhoids                           K92.2, Gastrointestinal hemorrhage, unspecified                           K63.5, Polyp of colon                           K55.21, Angiodysplasia of colon with hemorrhage                           K92.1, Melena (includes Hematochezia)                           D62, Acute posthemorrhagic anemia                           R93.3, Abnormal findings on diagnostic imaging of                            other parts of digestive tract CPT copyright 2019 [employer]. All rights reserved. The codes documented in this report are preliminary and upon coder review may  be revised to meet current compliance requirements. Justice Britain, MD 12/28/2018 3:47:00 PM Number of Addenda: 0

## 2018-12-29 LAB — GLUCOSE, CAPILLARY
Glucose-Capillary: 112 mg/dL — ABNORMAL HIGH (ref 70–99)
Glucose-Capillary: 142 mg/dL — ABNORMAL HIGH (ref 70–99)
Glucose-Capillary: 210 mg/dL — ABNORMAL HIGH (ref 70–99)
Glucose-Capillary: 223 mg/dL — ABNORMAL HIGH (ref 70–99)
Glucose-Capillary: 298 mg/dL — ABNORMAL HIGH (ref 70–99)

## 2018-12-29 LAB — COMPREHENSIVE METABOLIC PANEL
ALT: 14 U/L (ref 0–44)
AST: 22 U/L (ref 15–41)
Albumin: 3.1 g/dL — ABNORMAL LOW (ref 3.5–5.0)
Alkaline Phosphatase: 105 U/L (ref 38–126)
Anion gap: 15 (ref 5–15)
BUN: 36 mg/dL — ABNORMAL HIGH (ref 8–23)
CO2: 23 mmol/L (ref 22–32)
Calcium: 9 mg/dL (ref 8.9–10.3)
Chloride: 100 mmol/L (ref 98–111)
Creatinine, Ser: 1.88 mg/dL — ABNORMAL HIGH (ref 0.44–1.00)
GFR calc Af Amer: 32 mL/min — ABNORMAL LOW (ref >60)
GFR calc non Af Amer: 28 mL/min — ABNORMAL LOW (ref >60)
Glucose, Bld: 148 mg/dL — ABNORMAL HIGH (ref 70–99)
Potassium: 3.8 mmol/L (ref 3.5–5.1)
Sodium: 138 mmol/L (ref 135–145)
Total Bilirubin: 1 mg/dL (ref 0.3–1.2)
Total Protein: 8.7 g/dL — ABNORMAL HIGH (ref 6.5–8.1)

## 2018-12-29 LAB — CBC
HCT: 28.5 % — ABNORMAL LOW (ref 36.0–46.0)
Hemoglobin: 8.4 g/dL — ABNORMAL LOW (ref 12.0–15.0)
MCH: 27.2 pg (ref 26.0–34.0)
MCHC: 29.5 g/dL — ABNORMAL LOW (ref 30.0–36.0)
MCV: 92.2 fL (ref 80.0–100.0)
Platelets: 168 10*3/uL (ref 150–400)
RBC: 3.09 MIL/uL — ABNORMAL LOW (ref 3.87–5.11)
RDW: 16 % — ABNORMAL HIGH (ref 11.5–15.5)
WBC: 12.5 10*3/uL — ABNORMAL HIGH (ref 4.0–10.5)
nRBC: 0 % (ref 0.0–0.2)

## 2018-12-29 LAB — MAGNESIUM: Magnesium: 1.9 mg/dL (ref 1.7–2.4)

## 2018-12-29 LAB — PHOSPHORUS: Phosphorus: 4.3 mg/dL (ref 2.5–4.6)

## 2018-12-29 NOTE — Progress Notes (Signed)
Progress Note    ASSESSMENT AND PLAN:   1. GI bleed. Colonoscopy yesterday remarkable for two bleeding colonic angioectasias in transverse colon treated with APC and clips. Also multiple colon polyps  Hgb has remained stable ~ 8.4 for last several days.  -Hopefully home soon  2. Multiple colon polyps, all left intact including a 30 mm proximal ascending colon polyp. Removal of polyps will need to be discussed at outpatient follow up though it appears patient is known to Dr. Maryruth Bun with [facility].     SUBJECTIVE    No complaints. Sitting up, completed breakfast. Feels "good" today   OBJECTIVE:    Colonoscopy 12/28/18 Hemorrhoids found on digital rectal exam. - Blood in the entire examined colon. Lavaged with adequate visualization. - The examined portion of the ileum was normal. Tattooed. - Multiple 2 to 6 mm polyps at the hepatic flexure, in the ascending colon and in the cecum.[name] Kitchen Resection not attempted. - One 30 mm polyp in the proximal ascending colon. Resection not attempted. - Two bleeding colonic angioectasias. Treated with argon plasma coagulation (APC). Clips (MR conditional) were placed. - Non-bleeding non-thrombosed external and internal hemorrhoids. - The patient will be observed post-procedure, until all discharge criteria are met. - Return patient to hospital ward for ongoing care. - Advance diet as tolerated. - Continue present medications. - Avoid Chemical VTE PPx for next 48 hours. - Trend Hgb/Hct. - If patient has evidence of rebleeding, I think, repeat Colonoscopy to evaluate the region should be undertaken and if there is no bleeding then repeat VCE performed. - Patient will require outpatient clinic visit for follow up (to discuss consideration of Advanced Polypectomy/resection of the aforementioned large AC polyp. I am willing to do this as a referral, but I will not take over her care as her main Gastroenterologist. This will need to be discussed with  patient's husband and patient at future time in greater depth. - The findings and recommendations were discussed with the patient. - The findings and recommendations were discussed with the patient's family. - The findings and recommendations were discussed with the referring physician. .       Vital signs in last 24 hours: Temp:  [97 F (36.1 C)-98.4 F (36.9 C)] 98.1 F (36.7 C) (12/12 0605) Pulse Rate:  [61-93] 90 (12/12 0605) Resp:  [12-24] 18 (12/12 0605) BP: (106-149)/(33-72) 109/67 (12/12 0605) SpO2:  [99 %-100 %] 100 % (12/12 0605) Last BM Date: 12/26/18 General:   Alert, well-developed female in NAD EENT:  Normal hearing, non icteric sclera   Heart:  Regular rate and rhythm;  No lower extremity edema   Pulm: Normal respiratory effort   Abdomen:  Soft, nondistended, nontender.  Normal bowel sounds.          Neurologic:  Alert and  oriented x4;  grossly normal neurologically. Psych:  Pleasant, cooperative.  Normal mood and affect.   Intake/Output from previous day: 12/11 0701 - 12/12 0700 In: 1677.5 [P.O.:620; I.V.:1057.5] Out: 500 [Urine:500] Intake/Output this shift: No intake/output data recorded.  Lab Results: Recent Labs    12/27/18 0523 12/28/18 0523 12/29/18 0549  WBC 7.1 5.9 12.5*  HGB 7.3* 8.1* 8.4*  HCT 24.1* 27.3* 28.5*  PLT 132* 149* 168   BMET Recent Labs    12/29/18 0549  NA 138  K 3.8  CL 100  CO2 23  GLUCOSE 148*  BUN 36*  CREATININE 1.88*  CALCIUM 9.0   LFT Recent Labs    12/29/18  0549  PROT 8.7*  ALBUMIN 3.1*  AST 22  ALT 14  ALKPHOS 105  BILITOT 1.0     Principal Problem:   GI bleed Active Problems:   Chronic systolic heart failure (HCC)   Diabetes (HCC)   COPD (chronic obstructive pulmonary disease) (HCC)   Symptomatic anemia   Upper GI bleed   B12 deficiency   Iron deficiency anemia due to chronic blood loss   Folate deficiency   Anxiety   CAD (coronary artery disease)   Chronic back pain    Depression   Diabetes mellitus type 2, uncontrolled (HCC)   GERD (gastroesophageal reflux disease)   Hyperlipidemia   Hypertension   Obesity   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

## 2018-12-29 NOTE — Progress Notes (Signed)
PROGRESS NOTE    [name]  WLN:[id number] DOB: 06/18/1955 DOA: 12/16/2018 PCP: [facility]   Brief Narrative:   Patient is a 49 female with a past medical history of COPD with ongoing tobacco abuse, history of gastric ulcer, AVMs, diabetes mellitus, chronic back pain, CHF, iron deficiency anemia due to chronic blood loss, chronic systolic heart failure, history of coronary artery disease, chronic hepatitis C ,who presents from nursing facility for the evaluation of 2-day history of maroon-colored bloody bowel movements.  She was recently hospitalized here from 11/3-11/20 for GI bleed.  At that time she underwent extensive GI evaluation.  When she presented here this time, her hemoglobin was 4.8.    She has been transfused with PRBCs.  Total of 4 units.  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  APC applied . She was having maroon-colored stools after the procedure.  So she underwent  reenteroscopy on 12/19/18.  Not found to have active bleeding.  She again had maroon-colored liquidy stools on 12/19/18 night .  She underwent  balloon enteroscopy with no finding of active bleeding source.   For 3 days post balloon enteroscopy , she didnot not have any bloody bowel movements.  Her hemoglobin was stable.  Plan was to discharge to skilled nursing facility on 12/25/18  but she  had a moderate amount of bloody bowel movement on that day so discharge canceled. Underwent  capsule endoscopy with finding of bleeding in the area of cecum.    She underwent colonoscopy yesterday found to have 2 bleeding colonic angiectasias in the transverse colon, treated with APC and clips.  Hemoglobin has been stable.   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  FOBT positive.  Hemoglobin was 4.8 on admission.  Recently hospitalized this month and underwent GI evaluation with finding of AVM as noted on capsule endoscopy.  She underwent studies with colonoscopy, enteroscopy, capsule study, tagged RBC scan. underwent transfusion with 5 unit of PRBC on last admission.  When she was discharged home her hemoglobin was in the range of 10.  She also has history of gastritis/gastroenteritis. On this admission, she has been transfused with 4 units of PRBC. 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  APC applied . She was having maroon-colored stools after the procedure.  So she underwent  reenteroscopy on 12/19/18.  Not found to have active bleeding.  She again had maroon-colored liquidy stools on 12/19/18 night . She underwent  balloon enteroscopy with no finding of active bleeding source.  For 3 days post balloon enteroscopy , she didnot not have any bloody bowel movements.  Her  hemoglobin was stable.  Plan was to discharge to skilled nursing facility on 12/25/18  but she  had a moderate amount of bloody bowel movement on that day so discharge canceled. Underwent  capsule endoscopy with finding of bleeding in the area of cecum.   She underwent colonoscopy yesterday found to have 2 bleeding colonic angiectasias in the transverse colon, treated with APC  and clips.  Hemoglobin has been stable.   # Chronic systolic heart failure/restrictive  cardiomyopathy/coronary artery disease: Currently stable.  Denies any chest pain or shortness of breath.  Has trace edema on bilateral lower extremities.  Continue patient's home regimen of Coreg, Aldactone, Demadex, Lipitor.  Monitor input/output  # CKD stage 3:Baseline Creatinine fluctuates from 1.5-2.5.  Currently at baseline.  # COPD with ongoing tobacco abuse: On Dulera, Spiriva.  Continue bronchodilators as needed.  # History of hepatic encephalopathy: Noted on prior hospitalization.  History of hepatitis C/cirrhosis.  On lactulose.  Ammonia level is normal.Has mild thrombocytopenia due to liver cirrhosis  # GERD: Continue PPI  # Hypertension: Currently blood pressure stable.  Continue Coreg, spironolactone, Demadex  Diabetes type 2: Well controlled.  Hemoglobin A1c of 5.9 on 06/21/2018.  Continue sliding-scale insulin  Folic acid deficiency: Continue folate.  Debility/deconditioning/confusion at baseline:  Nursing home resident.  She is alert, awake but oriented to place and person only.  She states she ambulates with the help of walker.  Confused on baseline.  History of hepatic encephalopathy but ammonia is normal.  Continue supportive care.  Pressure Injury 12/16/18 Sacrum Mid Stage II -  Partial thickness loss of dermis presenting as a shallow open ulcer with a red, pink wound bed without slough. (Active)  12/16/18 1749  Location: Sacrum  Location Orientation: Mid  Staging: Stage II -  Partial thickness loss of dermis presenting as a shallow open ulcer with a red, pink wound bed without slough.  Wound Description (Comments):   Present on Admission: Yes     Pressure Injury 12/16/18 Heel Right Deep Tissue Injury - Purple or maroon localized area of discolored intact skin or blood-filled blister due to damage of underlying soft tissue from pressure and/or shear. (Active)  12/16/18   Location:  Heel  Location Orientation: Right  Staging: Deep Tissue Injury - Purple or maroon localized area of discolored intact skin or blood-filled blister due to damage of underlying soft tissue from pressure and/or shear.  Wound Description (Comments):   Present on Admission: Yes       Nutrition Problem: Increased nutrient needs Etiology: wound healing, acute illness    DVT prophylaxis:SCD Code Status: Full Family Communication: Called spouse on phone multiple times,connection unsuccessful Disposition Plan: Back to SNF when GI workup is complete  Consultants: GI    Procedures:  Capsule endoscopy, colonoscopy  Antimicrobials;  Anti-infectives (From admission, onward)   None     Subjective: Patient was seen and examined at bedside, she is hemodynamically stable.  He denies any nausea, vomiting or abdominal pain.  she also denies any bloody bowel movement last couple of days.  Objective: Vitals:   12/28/18 1609 12/28/18 1640 12/28/18 2025 12/29/18 0605  BP: 119/72 122/64 120/67 109/67  Pulse: 73 93 67 90  Resp: 18 (!) 24 18 18   Temp: 98 F (36.7 C)  (!) 97 F (36.1 C) 98.1 F (36.7 C)  TempSrc:      SpO2: 100% 100% 99% 100%  Weight:      Height:        Intake/Output Summary (  Last 24 hours) at 12/29/2018 1404 Last data filed at 12/29/2018 1154 Gross per 24 hour  Intake 2037.51 ml  Output 500 ml  Net 1537.51 ml   Filed Weights   12/21/18 0451 12/22/18 0447 12/25/18 0429  Weight: 87 kg 85.2 kg 81.6 kg    Examination:  General exam: Appears calm and comfortable, Appears deconditioned,  debilitated Respiratory system: Clear to auscultation. Respiratory effort normal. Cardiovascular system: S1 & S2 heard, RRR. No JVD, murmurs, rubs, gallops or clicks. No pedal edema. Gastrointestinal system: Abdomen is nondistended, soft and nontender. No organomegaly or masses felt. Normal bowel sounds heard. Central nervous system: Alert and oriented. No focal  neurological deficits. 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 Skin: No rashes, lesions or ulcers Psychiatry: Judgement and insight appear normal. Mood & affect appropriate.     Data Reviewed: I have personally reviewed following labs and imaging studies  CBC: Recent Labs  Lab 12/24/18 0444 12/25/18 1206 12/26/18 0544 12/27/18 0523 12/28/18 0523 12/29/18 0549  WBC 7.5 8.4 7.7 7.1 5.9 12.5*  NEUTROABS 5.0 6.3 5.0 4.5 3.8  --   HGB 8.1* 7.4* 7.4* 7.3* 8.1* 8.4*  HCT 26.9* 24.7* 25.0* 24.1* 27.3* 28.5*  MCV 92.4 91.8 92.9 92.0 91.0 92.2  PLT 130* 130* 128* 132* 149* 762   Basic Metabolic Panel: Recent Labs  Lab 12/23/18 0519 12/26/18 0544 12/29/18 0549  NA 140 141 138  K 3.6 4.0 3.8  CL 101 102 100  CO2 25 26 23   GLUCOSE 114* 126* 148*  BUN 29* 41* 36*  CREATININE 1.98* 1.90* 1.88*  CALCIUM 8.4* 8.6* 9.0  MG  --  1.7 1.9  PHOS  --  3.5 4.3   GFR: Estimated Creatinine Clearance: 31.9 mL/min (A) (by C-G formula based on SCr of 1.88 mg/dL (H)). Liver Function Tests: Recent Labs  Lab 12/29/18 0549  AST 22  ALT 14  ALKPHOS 105  BILITOT 1.0  PROT 8.7*  ALBUMIN 3.1*   No results for input(s): LIPASE, AMYLASE in the last 168 hours. No results for input(s): AMMONIA in the last 168 hours. Coagulation Profile: No results for input(s): INR, PROTIME in the last 168 hours. Cardiac Enzymes: No results for input(s): CKTOTAL, CKMB, CKMBINDEX, TROPONINI in the last 168 hours. BNP (last 3 results) No results for input(s): PROBNP in the last 8760 hours. HbA1C: No results for input(s): HGBA1C in the last 72 hours. CBG: Recent Labs  Lab 12/28/18 1636 12/28/18 2023 12/29/18 0005 12/29/18 0835 12/29/18 1152  GLUCAP 99 149* 142* 210* 298*   Lipid Profile: No results for input(s): CHOL, HDL, LDLCALC, TRIG, CHOLHDL, LDLDIRECT in the last 72 hours. Thyroid Function Tests: No results for input(s): TSH, T4TOTAL,  FREET4, T3FREE, THYROIDAB in the last 72 hours. Anemia Panel: No results for input(s): VITAMINB12, FOLATE, FERRITIN, TIBC, IRON, RETICCTPCT in the last 72 hours. Sepsis Labs: No results for input(s): PROCALCITON, LATICACIDVEN in the last 168 hours.  Recent Results (from the past 240 hour(s))  SARS CORONAVIRUS 2 (TAT 6-24 HRS) Nasopharyngeal Nasopharyngeal Swab     Status: None   Collection Time: 12/23/18  1:56 PM   Specimen: Nasopharyngeal Swab  Result Value Ref Range Status   SARS Coronavirus 2 NEGATIVE NEGATIVE Final    Comment: (NOTE) SARS-CoV-2 target nucleic acids are NOT DETECTED. The SARS-CoV-2 RNA is generally detectable in upper and lower respiratory specimens during the acute phase of infection. Negative results do not preclude SARS-CoV-2 infection, do  not rule out co-infections with other pathogens, and should not be used as the sole basis for treatment or other patient management decisions. Negative results must be combined with clinical observations, patient history, and epidemiological information. The expected result is Negative. Fact Sheet for Patients: SugarRoll.be Fact Sheet for Healthcare Providers: https://www.woods-mathews.com/ This test is not yet approved or cleared by the [place] FDA and  has been authorized for detection and/or diagnosis of SARS-CoV-2 by FDA under an Emergency Use Authorization (EUA). This EUA will remain  in effect (meaning this test can be used) for the duration of the COVID-19 declaration under Section 56 4(b)(1) of the Act, 21 U.S.C. section 360bbb-3(b)(1), unless the authorization is terminated or revoked sooner. Performed at [facility], [place] [address], [place], [place] [address]   SARS CORONAVIRUS 2 (TAT 6-24 HRS) Nasopharyngeal Nasopharyngeal Swab     Status: None   Collection Time: 12/26/18  2:30 PM   Specimen: Nasopharyngeal Swab  Result Value Ref Range Status   SARS  Coronavirus 2 NEGATIVE NEGATIVE Final    Comment: (NOTE) SARS-CoV-2 target nucleic acids are NOT DETECTED. The SARS-CoV-2 RNA is generally detectable in upper and lower respiratory specimens during the acute phase of infection. Negative results do not preclude SARS-CoV-2 infection, do not rule out co-infections with other pathogens, and should not be used as the sole basis for treatment or other patient management decisions. Negative results must be combined with clinical observations, patient history, and epidemiological information. The expected result is Negative. Fact Sheet for Patients: SugarRoll.be Fact Sheet for Healthcare Providers: https://www.woods-mathews.com/ This test is not yet approved or cleared by the [place] FDA and  has been authorized for detection and/or diagnosis of SARS-CoV-2 by FDA under an Emergency Use Authorization (EUA). This EUA will remain  in effect (meaning this test can be used) for the duration of the COVID-19 declaration under Section 56 4(b)(1) of the Act, 21 U.S.C. section 360bbb-3(b)(1), unless the authorization is terminated or revoked sooner. Performed at [facility], [place] [address], [place], [place] [address]          Radiology Studies: No results found.      Scheduled Meds: . ammonium lactate  1 application Topical BID  . atorvastatin  40 mg Oral q1800  . carvedilol  3.125 mg Oral BID WC  . Chlorhexidine Gluconate Cloth  6 each Topical Daily  . feeding supplement  1 Container Oral BID BM  . feeding supplement (ENSURE ENLIVE)  237 mL Oral Q24H  . feeding supplement (PRO-STAT SUGAR FREE 64)  30 mL Oral BID  . fluticasone  2 spray Each Nare Daily  . folic acid  1 mg Oral Daily  . insulin aspart  0-9 Units Subcutaneous Q4H  . lactulose  20 g Oral TID  . mirtazapine  30 mg Oral QHS  . multivitamin with minerals  1 tablet Oral Daily  . pantoprazole  40 mg Oral BID  . sodium  chloride flush  3 mL Intravenous Q12H  . sodium chloride flush  3 mL Intravenous Q12H  . spironolactone  12.5 mg Oral Daily  . torsemide  20 mg Oral Daily   Continuous Infusions: . sodium chloride Stopped (12/20/18 0943)  . sodium chloride       LOS: 13 days    Time spent:     Shawna Clamp, MD [facility]   If 7PM-7AM, please contact night-coverage

## 2018-12-29 NOTE — Progress Notes (Signed)
Received handoff from Maretta Bees, RN, during 7p shift change.

## 2018-12-30 LAB — CBC
HCT: 23.8 % — ABNORMAL LOW (ref 36.0–46.0)
HCT: 26.1 % — ABNORMAL LOW (ref 36.0–46.0)
Hemoglobin: 7.1 g/dL — ABNORMAL LOW (ref 12.0–15.0)
Hemoglobin: 7.7 g/dL — ABNORMAL LOW (ref 12.0–15.0)
MCH: 27.1 pg (ref 26.0–34.0)
MCH: 26.9 pg (ref 26.0–34.0)
MCHC: 29.8 g/dL — ABNORMAL LOW (ref 30.0–36.0)
MCHC: 29.5 g/dL — ABNORMAL LOW (ref 30.0–36.0)
MCV: 90.8 fL (ref 80.0–100.0)
MCV: 91.3 fL (ref 80.0–100.0)
Platelets: 155 10*3/uL (ref 150–400)
Platelets: 159 10*3/uL (ref 150–400)
RBC: 2.62 MIL/uL — ABNORMAL LOW (ref 3.87–5.11)
RBC: 2.86 MIL/uL — ABNORMAL LOW (ref 3.87–5.11)
RDW: 16.3 % — ABNORMAL HIGH (ref 11.5–15.5)
RDW: 16 % — ABNORMAL HIGH (ref 11.5–15.5)
WBC: 8.8 10*3/uL (ref 4.0–10.5)
WBC: 9.5 10*3/uL (ref 4.0–10.5)
nRBC: 0 % (ref 0.0–0.2)
nRBC: 0 % (ref 0.0–0.2)

## 2018-12-30 LAB — BASIC METABOLIC PANEL WITH GFR
Anion gap: 11 (ref 5–15)
BUN: 40 mg/dL — ABNORMAL HIGH (ref 8–23)
CO2: 25 mmol/L (ref 22–32)
Calcium: 8.6 mg/dL — ABNORMAL LOW (ref 8.9–10.3)
Chloride: 99 mmol/L (ref 98–111)
Creatinine, Ser: 1.92 mg/dL — ABNORMAL HIGH (ref 0.44–1.00)
GFR calc Af Amer: 31 mL/min — ABNORMAL LOW (ref >60)
GFR calc non Af Amer: 27 mL/min — ABNORMAL LOW (ref >60)
Glucose, Bld: 138 mg/dL — ABNORMAL HIGH (ref 70–99)
Potassium: 3.9 mmol/L (ref 3.5–5.1)
Sodium: 135 mmol/L (ref 135–145)

## 2018-12-30 LAB — HEMOGLOBIN AND HEMATOCRIT, BLOOD
HCT: 24.9 % — ABNORMAL LOW (ref 36.0–46.0)
Hemoglobin: 7.5 g/dL — ABNORMAL LOW (ref 12.0–15.0)

## 2018-12-30 LAB — GLUCOSE, CAPILLARY
Glucose-Capillary: 120 mg/dL — ABNORMAL HIGH (ref 70–99)
Glucose-Capillary: 133 mg/dL — ABNORMAL HIGH (ref 70–99)
Glucose-Capillary: 154 mg/dL — ABNORMAL HIGH (ref 70–99)
Glucose-Capillary: 156 mg/dL — ABNORMAL HIGH (ref 70–99)
Glucose-Capillary: 185 mg/dL — ABNORMAL HIGH (ref 70–99)
Glucose-Capillary: 191 mg/dL — ABNORMAL HIGH (ref 70–99)
Glucose-Capillary: 228 mg/dL — ABNORMAL HIGH (ref 70–99)

## 2018-12-30 NOTE — Progress Notes (Addendum)
PROGRESS NOTE    [name]  FKC:[id number] DOB: 1955-12-04 DOA: 12/16/2018 PCP: [facility]   Brief Narrative:   Patient is a 52 female with a past medical history of COPD with ongoing tobacco abuse, history of gastric ulcer, AVMs, diabetes mellitus, chronic back pain, CHF, iron deficiency anemia due to chronic blood loss, chronic systolic heart failure, history of coronary artery disease, chronic hepatitis C ,who presents from nursing facility for the evaluation of 2-day history of maroon-colored bloody bowel movements.  She was recently hospitalized here from 11/3-11/20 for GI bleed.  At that time she underwent extensive GI evaluation.  When she presented here this time, her hemoglobin was 4.8.    She has been transfused with PRBCs.  Total of 4 units.  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  APC applied . She was having maroon-colored stools after the procedure.  So she underwent  reenteroscopy on 12/19/18.  Not found to have active bleeding.  She again had maroon-colored liquidy stools on 12/19/18 night .  She underwent  balloon enteroscopy with no finding of active bleeding source.   For 3 days post balloon enteroscopy , she didnot not have any bloody bowel movements.  Her hemoglobin was stable.  Plan was to discharge to skilled nursing facility on 12/25/18  but she  had a moderate amount of bloody bowel movement on that day so discharge canceled. Underwent  capsule endoscopy with finding of bleeding in the area of cecum.    She underwent colonoscopy yesterday found to have 2 bleeding colonic angiectasias in the transverse colon, treated with APC and clips.  Hemoglobin has been stable.   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  FOBT positive.  Hemoglobin was 4.8 on admission.  Recently hospitalized this month and underwent GI evaluation with finding of AVM as noted on capsule endoscopy.  She underwent studies with colonoscopy, enteroscopy, capsule study, tagged RBC scan. underwent transfusion with 5 unit of PRBC on last admission.  When she was discharged home her hemoglobin was in the range of 10.  She also has history of gastritis/gastroenteritis. On this admission, she has been transfused with 4 units of PRBC. 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  APC applied . She was having maroon-colored stools after the procedure.  So she underwent  reenteroscopy on 12/19/18.  Not found to have active bleeding.  She again had maroon-colored liquidy stools on 12/19/18 night . She underwent  balloon enteroscopy with no finding of active bleeding source.  For 3 days post balloon enteroscopy , she didnot not have any bloody bowel  movements.  Her hemoglobin was stable.  Plan was to discharge to skilled nursing facility on 12/25/18  but she  had a moderate amount of bloody bowel movement on that day so discharge canceled. Underwent  capsule endoscopy with finding of bleeding in the area of cecum.   She underwent colonoscopy 12/11 found to have 2 bleeding colonic angiectasias in the transverse colon, treated  with APC and clips.   Hemoglobin dropped from 8.4-7.1.  GI recalled.  Patient was seen by GI: repeat CBC shows hemoglobin 7.5. GI: Patient remains hemodynamically stable.  She can be discharged and will follow up with Dr. Marius Ditch after discharge for repeat colonoscopy with polypectomy of several polyps.  # Chronic systolic heart failure/restrictive  cardiomyopathy/coronary artery disease: Currently stable.  Denies any chest pain or shortness of breath.  Has trace edema on bilateral lower extremities.  Continue patient's home regimen of Coreg, Aldactone, Demadex, Lipitor.  Monitor input/output  # CKD stage 3:Baseline Creatinine fluctuates from 1.5-2.5.  Currently at baseline.  # COPD with ongoing tobacco abuse: On Dulera, Spiriva.  Continue bronchodilators as needed.  # History of hepatic encephalopathy: Noted on prior hospitalization.  History of hepatitis C/cirrhosis.  On lactulose.  Ammonia level is normal.Has mild thrombocytopenia due to liver cirrhosis  # GERD: Continue PPI  # Hypertension: Currently blood pressure stable.  Continue Coreg, spironolactone, Demadex  Diabetes type 2: Well controlled.  Hemoglobin A1c of 5.9 on 06/21/2018.  Continue sliding-scale insulin  Folic acid deficiency: Continue folate.  Debility/deconditioning/confusion at baseline:  Nursing home resident.  She is alert, awake but oriented to place and person only.  She states she ambulates with the help of walker.  Confused on baseline.  History of hepatic encephalopathy but ammonia is normal.  Continue supportive care.  Pressure Injury 12/16/18 Sacrum Mid Stage II -  Partial thickness loss of dermis presenting as a shallow open ulcer with a red, pink wound bed without slough. (Active)  12/16/18 1749  Location: Sacrum  Location Orientation: Mid  Staging: Stage II -  Partial thickness loss of dermis presenting as a shallow open ulcer with a red, pink wound bed without slough.  Wound Description (Comments):   Present  on Admission: Yes     Pressure Injury 12/16/18 Heel Right Deep Tissue Injury - Purple or maroon localized area of discolored intact skin or blood-filled blister due to damage of underlying soft tissue from pressure and/or shear. (Active)  12/16/18   Location: Heel  Location Orientation: Right  Staging: Deep Tissue Injury - Purple or maroon localized area of discolored intact skin or blood-filled blister due to damage of underlying soft tissue from pressure and/or shear.  Wound Description (Comments):   Present on Admission: Yes     Nutrition Problem: Increased nutrient needs Etiology: wound healing, acute illness   DVT prophylaxis:SCD Code Status: Full Family Communication: Called spouse on phone multiple times,connection unsuccessful Disposition Plan:  Anticipated discharge to SNF tomorrow.  GI signed off. Consultants: GI    Procedures:  Capsule endoscopy, colonoscopy  Antimicrobials;  Anti-infectives (From admission, onward)   None     Subjective: Patient was seen and examined at bedside, she is hemodynamically stable.  He denies any nausea, vomiting or abdominal pain.  she also denies any bloody bowel movement last couple of days.  Her hemoglobin dropped from 8.4 - 7.1   Objective: Vitals:   12/29/18 1417 12/29/18 2058 12/30/18 0618 12/30/18 1409  BP: (!) 112/54 (!) 145/62 (!) 142/60 (!) 119/58  Pulse: 87  81 79 84  Resp: 16 18 18 15   Temp: 98 F (36.7 C) 98.2 F (36.8 C) 98 F (36.7 C) 98.1 F (36.7 C)  TempSrc: Oral Oral Oral Oral  SpO2: 100% 100% 98% 99%  Weight:      Height:        Intake/Output Summary (Last 24 hours) at 12/30/2018 1639 Last data filed at 12/30/2018 0600 Gross per 24 hour  Intake 363 ml  Output --  Net 363 ml   Filed Weights   12/21/18 0451 12/22/18 0447 12/25/18 0429  Weight: 87 kg 85.2 kg 81.6 kg    Examination:  General exam: Appears calm and comfortable, Appears deconditioned,  debilitated Respiratory system: Clear  to auscultation. Respiratory effort normal. Cardiovascular system: S1 & S2 heard, RRR. No JVD, murmurs, rubs, gallops or clicks. No pedal edema. Gastrointestinal system: Abdomen is nondistended, soft and nontender. No organomegaly or masses felt. Normal bowel sounds heard. Central nervous system: Alert and oriented. No focal neurological deficits. 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 Skin: No rashes, lesions or ulcers Psychiatry: Judgement and insight appear normal. Mood & affect appropriate.     Data Reviewed: I have personally reviewed following labs and imaging studies  CBC: Recent Labs  Lab 12/24/18 0444 12/25/18 1206 12/26/18 0544 12/27/18 0523 12/28/18 0523 12/29/18 0549 12/30/18 0524 12/30/18 0829 12/30/18 1405  WBC 7.5 8.4 7.7 7.1 5.9 12.5* 8.8 9.5  --   NEUTROABS 5.0 6.3 5.0 4.5 3.8  --   --   --   --   HGB 8.1* 7.4* 7.4* 7.3* 8.1* 8.4* 7.1* 7.7* 7.5*  HCT 26.9* 24.7* 25.0* 24.1* 27.3* 28.5* 23.8* 26.1* 24.9*  MCV 92.4 91.8 92.9 92.0 91.0 92.2 90.8 91.3  --   PLT 130* 130* 128* 132* 149* 168 155 159  --    Basic Metabolic Panel: Recent Labs  Lab 12/26/18 0544 12/29/18 0549 12/30/18 0524  NA 141 138 135  K 4.0 3.8 3.9  CL 102 100 99  CO2 26 23 25   GLUCOSE 126* 148* 138*  BUN 41* 36* 40*  CREATININE 1.90* 1.88* 1.92*  CALCIUM 8.6* 9.0 8.6*  MG 1.7 1.9  --   PHOS 3.5 4.3  --    GFR: Estimated Creatinine Clearance: 31.2 mL/min (A) (by C-G formula based on SCr of 1.92 mg/dL (H)). Liver Function Tests: Recent Labs  Lab 12/29/18 0549  AST 22  ALT 14  ALKPHOS 105  BILITOT 1.0  PROT 8.7*  ALBUMIN 3.1*   No results for input(s): LIPASE, AMYLASE in the last 168 hours. No results for input(s): AMMONIA in the last 168 hours. Coagulation Profile: No results for input(s): INR, PROTIME in the last 168 hours. Cardiac Enzymes: No results for input(s): CKTOTAL, CKMB, CKMBINDEX, TROPONINI in the last  168 hours. BNP (last 3 results) No results for input(s): PROBNP in the last 8760 hours. HbA1C: No results for input(s): HGBA1C in the last 72 hours. CBG: Recent Labs  Lab 12/29/18 2055 12/30/18 0001 12/30/18 0419 12/30/18 0751 12/30/18 1114  GLUCAP 223* 191* 120* 133* 228*   Lipid Profile: No results for input(s): CHOL, HDL, LDLCALC, TRIG, CHOLHDL, LDLDIRECT in the last 72 hours. Thyroid Function Tests: No results for input(s): TSH, T4TOTAL, FREET4, T3FREE, THYROIDAB in the last 72 hours. Anemia Panel: No results for input(s): VITAMINB12, FOLATE, FERRITIN, TIBC, IRON, RETICCTPCT in the last 72 hours. Sepsis Labs: No results for input(s): PROCALCITON, LATICACIDVEN in the last  168 hours.  Recent Results (from the past 240 hour(s))  SARS CORONAVIRUS 2 (TAT 6-24 HRS) Nasopharyngeal Nasopharyngeal Swab     Status: None   Collection Time: 12/23/18  1:56 PM   Specimen: Nasopharyngeal Swab  Result Value Ref Range Status   SARS Coronavirus 2 NEGATIVE NEGATIVE Final    Comment: (NOTE) SARS-CoV-2 target nucleic acids are NOT DETECTED. The SARS-CoV-2 RNA is generally detectable in upper and lower respiratory specimens during the acute phase of infection. Negative results do not preclude SARS-CoV-2 infection, do not rule out co-infections with other pathogens, and should not be used as the sole basis for treatment or other patient management decisions. Negative results must be combined with clinical observations, patient history, and epidemiological information. The expected result is Negative. Fact Sheet for Patients: SugarRoll.be Fact Sheet for Healthcare Providers: https://www.woods-mathews.com/ This test is not yet approved or cleared by the [place] FDA and  has been authorized for detection and/or diagnosis of SARS-CoV-2 by FDA under an Emergency Use Authorization (EUA). This EUA will remain  in effect (meaning this test can be  used) for the duration of the COVID-19 declaration under Section 56 4(b)(1) of the Act, 21 U.S.C. section 360bbb-3(b)(1), unless the authorization is terminated or revoked sooner. Performed at [facility], [place] [address], [place], [place] [address]   SARS CORONAVIRUS 2 (TAT 6-24 HRS) Nasopharyngeal Nasopharyngeal Swab     Status: None   Collection Time: 12/26/18  2:30 PM   Specimen: Nasopharyngeal Swab  Result Value Ref Range Status   SARS Coronavirus 2 NEGATIVE NEGATIVE Final    Comment: (NOTE) SARS-CoV-2 target nucleic acids are NOT DETECTED. The SARS-CoV-2 RNA is generally detectable in upper and lower respiratory specimens during the acute phase of infection. Negative results do not preclude SARS-CoV-2 infection, do not rule out co-infections with other pathogens, and should not be used as the sole basis for treatment or other patient management decisions. Negative results must be combined with clinical observations, patient history, and epidemiological information. The expected result is Negative. Fact Sheet for Patients: SugarRoll.be Fact Sheet for Healthcare Providers: https://www.woods-mathews.com/ This test is not yet approved or cleared by the [place] FDA and  has been authorized for detection and/or diagnosis of SARS-CoV-2 by FDA under an Emergency Use Authorization (EUA). This EUA will remain  in effect (meaning this test can be used) for the duration of the COVID-19 declaration under Section 56 4(b)(1) of the Act, 21 U.S.C. section 360bbb-3(b)(1), unless the authorization is terminated or revoked sooner. Performed at [facility], [place] [address], [place], [place] [address]      Radiology Studies: No results found.   Scheduled Meds: . ammonium lactate  1 application Topical BID  . atorvastatin  40 mg Oral q1800  . carvedilol  3.125 mg Oral BID WC  . Chlorhexidine Gluconate Cloth  6 each Topical Daily   . feeding supplement  1 Container Oral BID BM  . feeding supplement (ENSURE ENLIVE)  237 mL Oral Q24H  . feeding supplement (PRO-STAT SUGAR FREE 64)  30 mL Oral BID  . fluticasone  2 spray Each Nare Daily  . folic acid  1 mg Oral Daily  . insulin aspart  0-9 Units Subcutaneous Q4H  . lactulose  20 g Oral TID  . mirtazapine  30 mg Oral QHS  . multivitamin with minerals  1 tablet Oral Daily  . pantoprazole  40 mg Oral BID  . sodium chloride flush  3 mL Intravenous  Q12H  . sodium chloride flush  3 mL Intravenous Q12H  . spironolactone  12.5 mg Oral Daily  . torsemide  20 mg Oral Daily   Continuous Infusions: . sodium chloride Stopped (12/20/18 0943)  . sodium chloride       LOS: 14 days    Time spent:     Shawna Clamp, MD [facility]   If 7PM-7AM, please contact night-coverage

## 2018-12-30 NOTE — Progress Notes (Signed)
     Progress Note    ASSESSMENT AND PLAN:   1. GI bleed. Colonoscopy this admission remarkable for two bleeding colonic angioectasias in transverse colon treated with APC and clips. Also multiple colon polyps  Hgb declined from 8.4 yesterday morning to 7.1 this am. However, repeat hgb at 8:30 am showed hgb back up to 7.7. She denies overt GI bleeding. She hasn't required any more blood transfusions since late November.  -I am going to repeat H+H at 2pm.   -Hopefully home soon  2. Multiple colon polyps, all left intact including a 30 mm proximal ascending colon polyp. Removal of polyps will need to be discussed at outpatient follow up though it appears patient is known to Dr. Maryruth Bun with [facility].       SUBJECTIVE    Feels okay. Denies GI bleeding   OBJECTIVE:     Vital signs in last 24 hours: Temp:  [98 F (36.7 C)-98.2 F (36.8 C)] 98 F (36.7 C) (12/13 0618) Pulse Rate:  [79-87] 79 (12/13 0618) Resp:  [16-18] 18 (12/13 0618) BP: (112-145)/(54-62) 142/60 (12/13 0618) SpO2:  [98 %-100 %] 98 % (12/13 0618) Last BM Date: 12/29/18 General:   Alert, well-developed female in NAD Heart:  Regular rate and rhythm;    Pulm: Normal respiratory effort   Abdomen:  Soft, nondistended, nontender.  Normal bowel sounds.          Neurologic:  Alert and  oriented x4;  grossly normal neurologically. Psych:  Pleasant, cooperative.  Normal mood and affect.   Intake/Output from previous day: 12/12 0701 - 12/13 0700 In: 723 [P.O.:720; I.V.:3] Out: -  Intake/Output this shift: No intake/output data recorded.  Lab Results: Recent Labs    12/29/18 0549 12/30/18 0524 12/30/18 0829  WBC 12.5* 8.8 9.5  HGB 8.4* 7.1* 7.7*  HCT 28.5* 23.8* 26.1*  PLT 168 155 159   BMET Recent Labs    12/29/18 0549 12/30/18 0524  NA 138 135  K 3.8 3.9  CL 100 99  CO2 23 25  GLUCOSE 148* 138*  BUN 36* 40*  CREATININE 1.88* 1.92*  CALCIUM 9.0 8.6*   LFT Recent Labs    12/29/18 0549    PROT 8.7*  ALBUMIN 3.1*  AST 22  ALT 14  ALKPHOS 105  BILITOT 1.0     Principal Problem:   GI bleed Active Problems:   Chronic systolic heart failure (HCC)   Diabetes (HCC)   COPD (chronic obstructive pulmonary disease) (HCC)   Symptomatic anemia   Upper GI bleed   B12 deficiency   Iron deficiency anemia due to chronic blood loss   Folate deficiency   Anxiety   CAD (coronary artery disease)   Chronic back pain   Depression   Diabetes mellitus type 2, uncontrolled (HCC)   GERD (gastroesophageal reflux disease)   Hyperlipidemia   Hypertension   Obesity   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HCC)   Gastritis and gastroduodenitis   AVM (arteriovenous malformation) of small bowel, acquired with hemorrhage     LOS: 14 days   Tye Savoy ,NP 12/30/2018, 10:31 AM

## 2018-12-31 ENCOUNTER — Encounter: Payer: Self-pay | Admitting: *Deleted

## 2018-12-31 ENCOUNTER — Telehealth: Payer: Self-pay

## 2018-12-31 LAB — CBC
HCT: 24.8 % — ABNORMAL LOW (ref 36.0–46.0)
Hemoglobin: 7.3 g/dL — ABNORMAL LOW (ref 12.0–15.0)
MCH: 26.9 pg (ref 26.0–34.0)
MCHC: 29.4 g/dL — ABNORMAL LOW (ref 30.0–36.0)
MCV: 91.5 fL (ref 80.0–100.0)
Platelets: 164 10*3/uL (ref 150–400)
RBC: 2.71 MIL/uL — ABNORMAL LOW (ref 3.87–5.11)
RDW: 16.2 % — ABNORMAL HIGH (ref 11.5–15.5)
WBC: 10.1 10*3/uL (ref 4.0–10.5)
nRBC: 0 % (ref 0.0–0.2)

## 2018-12-31 LAB — BASIC METABOLIC PANEL
Anion gap: 11 (ref 5–15)
BUN: 44 mg/dL — ABNORMAL HIGH (ref 8–23)
CO2: 25 mmol/L (ref 22–32)
Calcium: 8.8 mg/dL — ABNORMAL LOW (ref 8.9–10.3)
Chloride: 97 mmol/L — ABNORMAL LOW (ref 98–111)
Creatinine, Ser: 2.21 mg/dL — ABNORMAL HIGH (ref 0.44–1.00)
GFR calc Af Amer: 26 mL/min — ABNORMAL LOW (ref >60)
GFR calc non Af Amer: 23 mL/min — ABNORMAL LOW (ref >60)
Glucose, Bld: 159 mg/dL — ABNORMAL HIGH (ref 70–99)
Potassium: 3.9 mmol/L (ref 3.5–5.1)
Sodium: 133 mmol/L — ABNORMAL LOW (ref 135–145)

## 2018-12-31 LAB — GLUCOSE, CAPILLARY
Glucose-Capillary: 146 mg/dL — ABNORMAL HIGH (ref 70–99)
Glucose-Capillary: 138 mg/dL — ABNORMAL HIGH (ref 70–99)
Glucose-Capillary: 229 mg/dL — ABNORMAL HIGH (ref 70–99)
Glucose-Capillary: 150 mg/dL — ABNORMAL HIGH (ref 70–99)
Glucose-Capillary: 140 mg/dL — ABNORMAL HIGH (ref 70–99)

## 2018-12-31 LAB — SARS CORONAVIRUS 2 (TAT 6-24 HRS): SARS Coronavirus 2: NEGATIVE

## 2018-12-31 MED ORDER — PHENYLEPHRINE HCL-NACL 10-0.9 MG/250ML-% IV SOLN
INTRAVENOUS | Status: AC
  Filled 2018-12-31: qty 500

## 2018-12-31 NOTE — Telephone Encounter (Signed)
-----   Message from Irving Copas., MD sent at 12/30/2018  1:06 PM EST ----- Regarding: Follow up [name], This patient needs a follow up in clinic with me in a few weeks to discuss EMR attempt of large AC polyp. Please reach out to the patient's husband at the end of the week and work on getting a clinic visit in January for her.Thanks. GM

## 2018-12-31 NOTE — Anesthesia Postprocedure Evaluation (Signed)
Anesthesia Post Note  Patient: [name]  Procedure(s) Performed: COLONOSCOPY WITH PROPOFOL (N/A ) SUBMUCOSAL TATTOO INJECTION HOT HEMOSTASIS (ARGON PLASMA COAGULATION/BICAP) (N/A ) HEMOSTASIS CLIP PLACEMENT     Patient location during evaluation: Endoscopy Anesthesia Type: MAC Level of consciousness: awake and alert Pain management: pain level controlled Vital Signs Assessment: post-procedure vital signs reviewed and stable Respiratory status: spontaneous breathing, nonlabored ventilation, respiratory function stable and patient connected to nasal cannula oxygen Cardiovascular status: blood pressure returned to baseline and stable Postop Assessment: no apparent nausea or vomiting Anesthetic complications: no    Last Vitals:  Vitals:   12/30/18 2020 12/31/18 0435  BP: (!) 94/48 (!) 111/57  Pulse: 78 71  Resp: 18 14  Temp: 37.3 C 36.7 C  SpO2: 98% 100%    Last Pain:  Vitals:   12/31/18 0435  TempSrc: Oral  PainSc:                  [place] S

## 2018-12-31 NOTE — Telephone Encounter (Signed)
Appt has been made to see Dr Rush Landmark on 02/05/19 at 910 am.  Message left for the pt with appt info.

## 2018-12-31 NOTE — Progress Notes (Signed)
PROGRESS NOTE    [name]  SHF:[id number] DOB: 11-Oct-1955 DOA: 12/16/2018 PCP: [facility]   Brief Narrative:   Patient is a 67 female with a past medical history of COPD with ongoing tobacco abuse, history of gastric ulcer, AVMs, diabetes mellitus, chronic back pain, CHF, iron deficiency anemia due to chronic blood loss, chronic systolic heart failure, history of coronary artery disease, chronic hepatitis C ,who presents from nursing facility for the evaluation of 2-day history of maroon-colored bloody bowel movements.  She was recently hospitalized here from 11/3-11/20 for GI bleed.  At that time she underwent extensive GI evaluation.  When she presented here this time, her hemoglobin was 4.8.    She has been transfused with PRBCs.  Total of 4 units.  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  APC applied . She was having maroon-colored stools after the procedure.  So she underwent  reenteroscopy on 12/19/18.  Not found to have active bleeding.  She again had maroon-colored liquidy stools on 12/19/18 night .  She underwent  balloon enteroscopy with no finding of active bleeding source.   For 3 days post balloon enteroscopy , she didnot not have any bloody bowel movements.  Her hemoglobin was stable.  Plan was to discharge to skilled nursing facility on 12/25/18  but she  had a moderate amount of bloody bowel movement on that day so discharge canceled. Underwent  capsule endoscopy with finding of bleeding in the area of cecum.    She underwent colonoscopy yesterday found to have 2 bleeding colonic angiectasias in the transverse colon, treated with APC and clips.  Hemoglobin has been stable.   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  FOBT positive.  Hemoglobin was 4.8 on admission.  Recently hospitalized this month and underwent GI evaluation with finding of AVM as noted on capsule endoscopy.  She underwent studies with colonoscopy, enteroscopy, capsule study, tagged RBC scan. underwent transfusion with 5 unit of PRBC on last admission.  When she was discharged home her hemoglobin was in the range of 10.  She also has history of gastritis/gastroenteritis. On this admission, she has been transfused with 4 units of PRBC. 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  APC applied . She was having maroon-colored stools after the procedure.  So she underwent  reenteroscopy on 12/19/18.  Not found to have active bleeding.  She again had maroon-colored liquidy stools on 12/19/18 night . She underwent  balloon enteroscopy with no finding of active bleeding source.  For 3 days post balloon enteroscopy , she didnot not have any bloody bowel  movements.  Her hemoglobin was stable.  Plan was to discharge to skilled nursing facility on 12/25/18  but she  had a moderate amount of bloody bowel movement on that day so discharge canceled. Underwent  capsule endoscopy with finding of bleeding in the area of cecum.   She underwent colonoscopy 12/11 found to have 2 bleeding colonic angiectasias in the transverse colon, treated  with APC and clips.   Hemoglobin dropped from 8.4-7.1.  GI recalled.  Patient was seen by GI: repeat CBC shows hemoglobin 7.5. GI: Patient remains hemodynamically stable.  She can be discharged and will follow up with Dr. Marius Ditch after discharge for repeat colonoscopy with polypectomy of several polyps.  # Chronic systolic heart failure/restrictive  cardiomyopathy/coronary artery disease: Currently stable.  Denies any chest pain or shortness of breath.  Has trace edema on bilateral lower extremities.  Continue patient's home regimen of Coreg, Aldactone, Demadex, Lipitor.  Monitor input/output  # CKD stage 3:Baseline Creatinine fluctuates from 1.5-2.5.  Currently at baseline.  # COPD with ongoing tobacco abuse: On Dulera, Spiriva.  Continue bronchodilators as needed.  # History of hepatic encephalopathy: Noted on prior hospitalization.  History of hepatitis C/cirrhosis.  On lactulose.  Ammonia level is normal.Has mild thrombocytopenia due to liver cirrhosis  # GERD: Continue PPI  # Hypertension: Currently blood pressure stable.  Continue Coreg, spironolactone, Demadex  Diabetes type 2: Well controlled.  Hemoglobin A1c of 5.9 on 06/21/2018.  Continue sliding-scale insulin  Folic acid deficiency: Continue folate.  Debility/deconditioning/confusion at baseline:  Nursing home resident.  She is alert, awake but oriented to place and person only.  She states she ambulates with the help of walker.  Confused on baseline.  History of hepatic encephalopathy but ammonia is normal.  Continue supportive care.  Pressure Injury 12/16/18 Sacrum Mid Stage II -  Partial thickness loss of dermis presenting as a shallow open ulcer with a red, pink wound bed without slough. (Active)  12/16/18 1749  Location: Sacrum  Location Orientation: Mid  Staging: Stage II -  Partial thickness loss of dermis presenting as a shallow open ulcer with a red, pink wound bed without slough.  Wound Description (Comments):   Present  on Admission: Yes     Pressure Injury 12/16/18 Heel Right Deep Tissue Injury - Purple or maroon localized area of discolored intact skin or blood-filled blister due to damage of underlying soft tissue from pressure and/or shear. (Active)  12/16/18   Location: Heel  Location Orientation: Right  Staging: Deep Tissue Injury - Purple or maroon localized area of discolored intact skin or blood-filled blister due to damage of underlying soft tissue from pressure and/or shear.  Wound Description (Comments):   Present on Admission: Yes     Nutrition Problem: Increased nutrient needs Etiology: wound healing, acute illness   DVT prophylaxis:SCD Code Status: Full Family Communication: Called spouse on phone multiple times,connection unsuccessful Disposition Plan:  Anticipated discharge to SNF tomorrow.  GI signed off. Consultants: GI    Procedures:  Capsule endoscopy, colonoscopy  Antimicrobials;  Anti-infectives (From admission, onward)   None     Subjective: Patient was seen and examined at bedside, she is hemodynamically stable.  sHe denies any nausea, vomiting or abdominal pain.  she also denies any bloody bowel movement last couple of days.  Hb stable around 7 .  Pending covid test.   Objective: Vitals:   12/31/18 0335 12/31/18 0435 12/31/18 0808 12/31/18 1317  BP:  (!) 111/57 115/68 (!) 111/55  Pulse:  71 70 74  Resp:  14  17  Temp:  98 F (36.7 C)  98.1 F (36.7 C)  TempSrc:  Oral  Oral  SpO2:  100%  98%  Weight: 77.7 kg     Height:        Intake/Output Summary (Last 24 hours) at 12/31/2018 1736 Last data filed at 12/31/2018 0600 Gross per 24 hour  Intake 240 ml  Output --  Net 240 ml   Filed Weights   12/22/18 0447 12/25/18 0429 12/31/18 0335  Weight: 85.2 kg 81.6 kg 77.7 kg    Examination:  General exam: Appears calm and comfortable, Appears deconditioned,  debilitated Respiratory system: Clear to auscultation. Respiratory effort  normal. Cardiovascular system: S1 & S2 heard, RRR. No JVD, murmurs, rubs, gallops or clicks. No pedal edema. Gastrointestinal system: Abdomen is nondistended, soft and nontender. No organomegaly or masses felt. Normal bowel sounds heard. Central nervous system: Alert and oriented. No focal neurological deficits. 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 Skin: No rashes, lesions or ulcers Psychiatry: Judgement and insight appear normal. Mood & affect appropriate.     Data Reviewed: I have personally reviewed following labs and imaging studies  CBC: Recent Labs  Lab 12/25/18 1206 12/26/18 0544 12/27/18 0523 12/28/18 0523 12/29/18 0549 12/30/18 0524 12/30/18 0829 12/30/18 1405 12/31/18 0514  WBC 8.4 7.7 7.1 5.9 12.5* 8.8 9.5  --  10.1  NEUTROABS 6.3 5.0 4.5 3.8  --   --   --   --   --   HGB 7.4* 7.4* 7.3* 8.1* 8.4* 7.1* 7.7* 7.5* 7.3*  HCT 24.7* 25.0* 24.1* 27.3* 28.5* 23.8* 26.1* 24.9* 24.8*  MCV 91.8 92.9 92.0 91.0 92.2 90.8 91.3  --  91.5  PLT 130* 128* 132* 149* 168 155 159  --  237   Basic Metabolic Panel: Recent Labs  Lab 12/26/18 0544 12/29/18 0549 12/30/18 0524 12/31/18 0514  NA 141 138 135 133*  K 4.0 3.8 3.9 3.9  CL 102 100 99 97*  CO2 26 23 25 25   GLUCOSE 126* 148* 138* 159*  BUN 41* 36* 40* 44*  CREATININE 1.90* 1.88* 1.92* 2.21*  CALCIUM 8.6* 9.0 8.6* 8.8*  MG 1.7 1.9  --   --   PHOS 3.5 4.3  --   --    GFR: Estimated Creatinine Clearance: 26.5 mL/min (A) (by C-G formula based on SCr of 2.21 mg/dL (H)). Liver Function Tests: Recent Labs  Lab 12/29/18 0549  AST 22  ALT 14  ALKPHOS 105  BILITOT 1.0  PROT 8.7*  ALBUMIN 3.1*   No results for input(s): LIPASE, AMYLASE in the last 168 hours. No results for input(s): AMMONIA in the last 168 hours. Coagulation Profile: No results for input(s): INR, PROTIME in the last 168 hours. Cardiac Enzymes: No results for input(s): CKTOTAL, CKMB,  CKMBINDEX, TROPONINI in the last 168 hours. BNP (last 3 results) No results for input(s): PROBNP in the last 8760 hours. HbA1C: No results for input(s): HGBA1C in the last 72 hours. CBG: Recent Labs  Lab 12/30/18 2336 12/31/18 0429 12/31/18 0738 12/31/18 1119 12/31/18 1653  GLUCAP 185* 146* 138* 229* 150*   Lipid Profile: No results for input(s): CHOL, HDL, LDLCALC, TRIG, CHOLHDL, LDLDIRECT in the last 72 hours. Thyroid Function Tests: No results for input(s): TSH, T4TOTAL, FREET4, T3FREE, THYROIDAB in the last 72 hours. Anemia Panel: No results for input(s): VITAMINB12, FOLATE, FERRITIN, TIBC, IRON, RETICCTPCT in the last  72 hours. Sepsis Labs: No results for input(s): PROCALCITON, LATICACIDVEN in the last 168 hours.  Recent Results (from the past 240 hour(s))  SARS CORONAVIRUS 2 (TAT 6-24 HRS) Nasopharyngeal Nasopharyngeal Swab     Status: None   Collection Time: 12/23/18  1:56 PM   Specimen: Nasopharyngeal Swab  Result Value Ref Range Status   SARS Coronavirus 2 NEGATIVE NEGATIVE Final    Comment: (NOTE) SARS-CoV-2 target nucleic acids are NOT DETECTED. The SARS-CoV-2 RNA is generally detectable in upper and lower respiratory specimens during the acute phase of infection. Negative results do not preclude SARS-CoV-2 infection, do not rule out co-infections with other pathogens, and should not be used as the sole basis for treatment or other patient management decisions. Negative results must be combined with clinical observations, patient history, and epidemiological information. The expected result is Negative. Fact Sheet for Patients: SugarRoll.be Fact Sheet for Healthcare Providers: https://www.woods-mathews.com/ This test is not yet approved or cleared by the [place] FDA and  has been authorized for detection and/or diagnosis of SARS-CoV-2 by FDA under an Emergency Use Authorization (EUA). This EUA will remain  in  effect (meaning this test can be used) for the duration of the COVID-19 declaration under Section 56 4(b)(1) of the Act, 21 U.S.C. section 360bbb-3(b)(1), unless the authorization is terminated or revoked sooner. Performed at [facility], [place] [address], [place], [place] [address]   SARS CORONAVIRUS 2 (TAT 6-24 HRS) Nasopharyngeal Nasopharyngeal Swab     Status: None   Collection Time: 12/26/18  2:30 PM   Specimen: Nasopharyngeal Swab  Result Value Ref Range Status   SARS Coronavirus 2 NEGATIVE NEGATIVE Final    Comment: (NOTE) SARS-CoV-2 target nucleic acids are NOT DETECTED. The SARS-CoV-2 RNA is generally detectable in upper and lower respiratory specimens during the acute phase of infection. Negative results do not preclude SARS-CoV-2 infection, do not rule out co-infections with other pathogens, and should not be used as the sole basis for treatment or other patient management decisions. Negative results must be combined with clinical observations, patient history, and epidemiological information. The expected result is Negative. Fact Sheet for Patients: SugarRoll.be Fact Sheet for Healthcare Providers: https://www.woods-mathews.com/ This test is not yet approved or cleared by the [place] FDA and  has been authorized for detection and/or diagnosis of SARS-CoV-2 by FDA under an Emergency Use Authorization (EUA). This EUA will remain  in effect (meaning this test can be used) for the duration of the COVID-19 declaration under Section 56 4(b)(1) of the Act, 21 U.S.C. section 360bbb-3(b)(1), unless the authorization is terminated or revoked sooner. Performed at [facility], [place] [address] [place], [place] [address]      Radiology Studies: No results found.   Scheduled Meds: . ammonium lactate  1 application Topical BID  . atorvastatin  40 mg Oral q1800  . carvedilol  3.125 mg Oral BID WC  . Chlorhexidine  Gluconate Cloth  6 each Topical Daily  . feeding supplement  1 Container Oral BID BM  . feeding supplement (ENSURE ENLIVE)  237 mL Oral Q24H  . feeding supplement (PRO-STAT SUGAR FREE 64)  30 mL Oral BID  . fluticasone  2 spray Each Nare Daily  . folic acid  1 mg Oral Daily  . insulin aspart  0-9 Units Subcutaneous Q4H  . lactulose  20 g Oral TID  . mirtazapine  30 mg Oral QHS  . multivitamin with minerals  1 tablet Oral Daily  . pantoprazole  40 mg Oral BID  . sodium chloride flush  3 mL Intravenous Q12H  . sodium chloride flush  3 mL Intravenous Q12H  . spironolactone  12.5 mg Oral Daily  . torsemide  20 mg Oral Daily   Continuous Infusions: . sodium chloride Stopped (12/20/18 0943)  . sodium chloride    . phenylephrine       LOS: 15 days    Time spent:     Vicenta Dunning, MD [facility]   If 7PM-7AM, please contact night-coverage

## 2019-01-01 LAB — CBC WITH DIFFERENTIAL/PLATELET
Abs Immature Granulocytes: 0.03 10*3/uL (ref 0.00–0.07)
Basophils Absolute: 0.1 10*3/uL (ref 0.0–0.1)
Basophils Relative: 1 %
Eosinophils Absolute: 0.5 10*3/uL (ref 0.0–0.5)
Eosinophils Relative: 5 %
HCT: 25.1 % — ABNORMAL LOW (ref 36.0–46.0)
Hemoglobin: 7.6 g/dL — ABNORMAL LOW (ref 12.0–15.0)
Immature Granulocytes: 0 %
Lymphocytes Relative: 10 %
Lymphs Abs: 1.1 10*3/uL (ref 0.7–4.0)
MCH: 27.5 pg (ref 26.0–34.0)
MCHC: 30.3 g/dL (ref 30.0–36.0)
MCV: 90.9 fL (ref 80.0–100.0)
Monocytes Absolute: 0.7 10*3/uL (ref 0.1–1.0)
Monocytes Relative: 7 %
Neutro Abs: 7.9 10*3/uL — ABNORMAL HIGH (ref 1.7–7.7)
Neutrophils Relative %: 77 %
Platelets: 172 10*3/uL (ref 150–400)
RBC: 2.76 MIL/uL — ABNORMAL LOW (ref 3.87–5.11)
RDW: 16.6 % — ABNORMAL HIGH (ref 11.5–15.5)
WBC: 10.3 10*3/uL (ref 4.0–10.5)
nRBC: 0 % (ref 0.0–0.2)

## 2019-01-01 LAB — BASIC METABOLIC PANEL
Anion gap: 13 (ref 5–15)
BUN: 45 mg/dL — ABNORMAL HIGH (ref 8–23)
CO2: 23 mmol/L (ref 22–32)
Calcium: 8.9 mg/dL (ref 8.9–10.3)
Chloride: 98 mmol/L (ref 98–111)
Creatinine, Ser: 2.21 mg/dL — ABNORMAL HIGH (ref 0.44–1.00)
GFR calc Af Amer: 26 mL/min — ABNORMAL LOW (ref >60)
GFR calc non Af Amer: 23 mL/min — ABNORMAL LOW (ref >60)
Glucose, Bld: 194 mg/dL — ABNORMAL HIGH (ref 70–99)
Potassium: 3.9 mmol/L (ref 3.5–5.1)
Sodium: 134 mmol/L — ABNORMAL LOW (ref 135–145)

## 2019-01-01 LAB — GLUCOSE, CAPILLARY
Glucose-Capillary: 121 mg/dL — ABNORMAL HIGH (ref 70–99)
Glucose-Capillary: 123 mg/dL — ABNORMAL HIGH (ref 70–99)
Glucose-Capillary: 149 mg/dL — ABNORMAL HIGH (ref 70–99)
Glucose-Capillary: 183 mg/dL — ABNORMAL HIGH (ref 70–99)
Glucose-Capillary: 188 mg/dL — ABNORMAL HIGH (ref 70–99)
Glucose-Capillary: 212 mg/dL — ABNORMAL HIGH (ref 70–99)

## 2019-01-01 LAB — HEMOGLOBIN A1C
Hgb A1c MFr Bld: 5.5 % (ref 4.8–5.6)
Mean Plasma Glucose: 111.15 mg/dL

## 2019-01-01 MED ORDER — SENNOSIDES-DOCUSATE SODIUM 8.6-50 MG PO TABS: 1.0000 | ORAL_TABLET | Freq: Every evening | ORAL | 0 refills | Status: DC | PRN

## 2019-01-01 NOTE — Progress Notes (Signed)
PROGRESS NOTE    [name]  BJY:[id number] DOB: 06-03-1955 DOA: 12/16/2018 PCP: [facility]   Brief Narrative:   Patient is a 71 female with a past medical history of COPD with ongoing tobacco abuse, history of gastric ulcer, AVMs, diabetes mellitus, chronic back pain, CHF, iron deficiency anemia due to chronic blood loss, chronic systolic heart failure, history of coronary artery disease, chronic hepatitis C ,who presents from nursing facility for the evaluation of 2-day history of maroon-colored bloody bowel movements.  She was recently hospitalized here from 11/3-11/20 for GI bleed.  At that time she underwent extensive GI evaluation.  When she presented here this time, her hemoglobin was 4.8.    She has been transfused with PRBCs.  Total of 4 units.  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  APC applied . She was having maroon-colored stools after the procedure.  So she underwent  reenteroscopy on 12/19/18.  Not found to have active bleeding.  She again had maroon-colored liquidy stools on 12/19/18 night .  She underwent  balloon enteroscopy with no finding of active bleeding source.   For 3 days post balloon enteroscopy , she didnot not have any bloody bowel movements.  Her hemoglobin was stable.  Plan was to discharge to skilled nursing facility on 12/25/18  but she  had a moderate amount of bloody bowel movement on that day so discharge canceled. Underwent  capsule endoscopy with finding of bleeding in the area of cecum.    She underwent colonoscopy yesterday found to have 2 bleeding colonic angiectasias in the transverse colon, treated with APC and clips.  Hemoglobin has been stable.   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  FOBT positive.  Hemoglobin was 4.8 on admission.  Recently hospitalized this month and underwent GI evaluation with finding of AVM as noted on capsule endoscopy.  She underwent studies with colonoscopy, enteroscopy, capsule study, tagged RBC scan. underwent transfusion with 5 unit of PRBC on last admission.  When she was discharged home her hemoglobin was in the range of 10.  She also has history of gastritis/gastroenteritis. On this admission, she has been transfused with 4 units of PRBC. 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  APC applied . She was having maroon-colored stools after the procedure.  So she underwent  reenteroscopy on 12/19/18.  Not found to have active bleeding.  She again had maroon-colored liquidy stools on 12/19/18 night . She underwent  balloon enteroscopy with no finding of active bleeding source.  For 3 days post balloon enteroscopy , she didnot not have any bloody bowel  movements.  Her hemoglobin was stable.  Plan was to discharge to skilled nursing facility on 12/25/18  but she  had a moderate amount of bloody bowel movement on that day so discharge canceled. Underwent  capsule endoscopy with finding of bleeding in the area of cecum.   She underwent colonoscopy 12/11 found to have 2 bleeding colonic angiectasias in the transverse colon, treated  with APC and clips.   Hemoglobin dropped from 8.4-7.1.  GI recalled.  Patient was seen by GI: repeat CBC shows hemoglobin 7.5. GI: Patient remains hemodynamically stable.  She can be discharged and will follow up with Dr. Marius Ditch after discharge for repeat colonoscopy with polypectomy of several polyps.  # Chronic systolic heart failure/restrictive  cardiomyopathy/coronary artery disease: Currently stable.  Denies any chest pain or shortness of breath.  Has trace edema on bilateral lower extremities.  Continue patient's home regimen of Coreg, Aldactone, Demadex, Lipitor.  Monitor input/output  # CKD stage 3:Baseline Creatinine fluctuates from 1.5-2.5.  Currently at baseline.  # COPD with ongoing tobacco abuse: On Dulera, Spiriva.  Continue bronchodilators as needed.  # History of hepatic encephalopathy: Noted on prior hospitalization.  History of hepatitis C/cirrhosis.  On lactulose.  Ammonia level is normal.Has mild thrombocytopenia due to liver cirrhosis  # GERD: Continue PPI  # Hypertension: Currently blood pressure stable.  Continue Coreg, spironolactone, Demadex  Diabetes type 2: Well controlled.  Hemoglobin A1c of 5.9 on 06/21/2018.  Continue sliding-scale insulin Recheck A1c. Patient might have low hemoglobin A1c due to her underlying liver disease. Might need some oral agent as she has uncontrolled hyperglycemia.   Folic acid deficiency: Continue folate.  Debility/deconditioning/confusion at baseline:  Nursing home resident.  She is alert, awake but oriented to place and person only.  She states she ambulates with the help of walker.  Confused on baseline.  History of hepatic encephalopathy but ammonia is normal.  Continue supportive care.  Pressure Injury 12/16/18 Sacrum Mid Stage II -  Partial thickness loss of dermis presenting as a shallow open ulcer with a red, pink wound bed without slough. (Active)  12/16/18 1749  Location: Sacrum  Location Orientation: Mid  Staging: Stage  II -  Partial thickness loss of dermis presenting as a shallow open ulcer with a red, pink wound bed without slough.  Wound Description (Comments):   Present on Admission: Yes     Pressure Injury 12/16/18 Heel Right Deep Tissue Injury - Purple or maroon localized area of discolored intact skin or blood-filled blister due to damage of underlying soft tissue from pressure and/or shear. (Active)  12/16/18   Location: Heel  Location Orientation: Right  Staging: Deep Tissue Injury - Purple or maroon localized area of discolored intact skin or blood-filled blister due to damage of underlying soft tissue from pressure and/or shear.  Wound Description (Comments):   Present on Admission: Yes     Nutrition Problem: Increased nutrient needs Etiology: wound healing, acute illness   DVT prophylaxis:SCD Code Status: Full Family Communication: Called spouse on phone multiple times,connection unsuccessful Disposition Plan:  Anticipated discharge to SNF tomorrow.  GI signed off. Consultants: GI    Procedures:  Capsule endoscopy, colonoscopy  Antimicrobials;  Anti-infectives (From admission, onward)   None     Subjective: Patient was seen and examined at bedside, she is hemodynamically stable.  sHe denies any nausea, vomiting or abdominal pain.  she also denies any bloody bowel movement last couple of days.  Hb stable around 7.5   And stable  covid test is negative   Blood sugars are elevated.  A1c added. Objective: Vitals:   12/31/18 1317 12/31/18 2016 01/01/19 0405 01/01/19 1415  BP: (!) 111/55 (!) 109/47 132/69 (!) 119/51  Pulse: 74 72 79 77  Resp: 17 17 18 18   Temp: 98.1 F (36.7 C) 98.5 F (36.9 C) 98 F (36.7 C) 97.8 F (36.6 C)  TempSrc: Oral Oral Oral   SpO2: 98% 100% 100% 100%  Weight:   77.7 kg   Height:        Intake/Output Summary (Last 24 hours) at 01/01/2019 1834 Last data filed at 01/01/2019 1500 Gross per 24 hour  Intake 810 ml  Output 301 ml  Net  509 ml   Filed Weights   12/25/18 0429 12/31/18 0335 01/01/19 0405  Weight: 81.6 kg 77.7 kg 77.7 kg    Examination:  General exam: Appears calm and comfortable, Appears deconditioned,  debilitated Respiratory system: Clear to auscultation. Respiratory effort normal. Cardiovascular system: S1 & S2 heard, RRR. No JVD, murmurs, rubs, gallops or clicks. No pedal edema. Gastrointestinal system: Abdomen is nondistended, soft and nontender. No organomegaly or masses felt. Normal bowel sounds heard. Central nervous system: Alert and oriented. No focal neurological deficits. 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 Skin: No rashes, lesions or ulcers Psychiatry: Judgement and insight appear normal. Mood & affect appropriate.     Data Reviewed: I have personally reviewed following labs and imaging studies  CBC: Recent Labs  Lab 12/26/18 0544 12/27/18 0523 12/28/18 0523 12/29/18 0549 12/30/18 0524 12/30/18 0829 12/30/18 1405 12/31/18 0514 01/01/19 0954  WBC 7.7 7.1 5.9 12.5* 8.8 9.5  --  10.1 10.3  NEUTROABS 5.0 4.5 3.8  --   --   --   --   --  7.9*  HGB 7.4* 7.3* 8.1* 8.4* 7.1* 7.7* 7.5* 7.3* 7.6*  HCT 25.0* 24.1* 27.3* 28.5* 23.8* 26.1* 24.9* 24.8* 25.1*  MCV 92.9 92.0 91.0 92.2 90.8 91.3  --  91.5 90.9  PLT 128* 132* 149* 168 155 159  --  164 347   Basic Metabolic Panel: Recent Labs  Lab 12/26/18 0544 12/29/18 0549 12/30/18 0524 12/31/18 0514 01/01/19 0954  NA 141 138 135 133* 134*  K 4.0 3.8 3.9 3.9 3.9  CL 102 100 99 97* 98  CO2 26 23 25 25 23   GLUCOSE 126* 148* 138* 159* 194*  BUN 41* 36* 40* 44* 45*  CREATININE 1.90* 1.88* 1.92* 2.21* 2.21*  CALCIUM 8.6* 9.0 8.6* 8.8* 8.9  MG 1.7 1.9  --   --   --   PHOS 3.5 4.3  --   --   --    GFR: Estimated Creatinine Clearance: 26.5 mL/min (A) (by C-G formula based on SCr of 2.21 mg/dL (H)). Liver Function Tests: Recent Labs  Lab 12/29/18 0549  AST 22  ALT 14    ALKPHOS 105  BILITOT 1.0  PROT 8.7*  ALBUMIN 3.1*   No results for input(s): LIPASE, AMYLASE in the last 168 hours. No results for input(s): AMMONIA in the last 168 hours. Coagulation Profile: No results for input(s): INR, PROTIME in the last 168 hours. Cardiac Enzymes: No results for input(s): CKTOTAL, CKMB, CKMBINDEX, TROPONINI in the last 168 hours. BNP (last 3 results) No results for input(s): PROBNP in the last 8760 hours. HbA1C: Recent Labs    01/01/19 0954  HGBA1C 5.5   CBG: Recent Labs  Lab 01/01/19 0009 01/01/19 0403 01/01/19  0802 01/01/19 1144 01/01/19 1619  GLUCAP 188* 123* 121* 212* 149*   Lipid Profile: No results for input(s): CHOL, HDL, LDLCALC, TRIG, CHOLHDL, LDLDIRECT in the last 72 hours. Thyroid Function Tests: No results for input(s): TSH, T4TOTAL, FREET4, T3FREE, THYROIDAB in the last 72 hours. Anemia Panel: No results for input(s): VITAMINB12, FOLATE, FERRITIN, TIBC, IRON, RETICCTPCT in the last 72 hours. Sepsis Labs: No results for input(s): PROCALCITON, LATICACIDVEN in the last 168 hours.  Recent Results (from the past 240 hour(s))  SARS CORONAVIRUS 2 (TAT 6-24 HRS) Nasopharyngeal Nasopharyngeal Swab     Status: None   Collection Time: 12/23/18  1:56 PM   Specimen: Nasopharyngeal Swab  Result Value Ref Range Status   SARS Coronavirus 2 NEGATIVE NEGATIVE Final    Comment: (NOTE) SARS-CoV-2 target nucleic acids are NOT DETECTED. The SARS-CoV-2 RNA is generally detectable in upper and lower respiratory specimens during the acute phase of infection. Negative results do not preclude SARS-CoV-2 infection, do not rule out co-infections with other pathogens, and should not be used as the sole basis for treatment or other patient management decisions. Negative results must be combined with clinical observations, patient history, and epidemiological information. The expected result is Negative. Fact Sheet for  Patients: SugarRoll.be Fact Sheet for Healthcare Providers: https://www.woods-mathews.com/ This test is not yet approved or cleared by the [place] FDA and  has been authorized for detection and/or diagnosis of SARS-CoV-2 by FDA under an Emergency Use Authorization (EUA). This EUA will remain  in effect (meaning this test can be used) for the duration of the COVID-19 declaration under Section 56 4(b)(1) of the Act, 21 U.S.C. section 360bbb-3(b)(1), unless the authorization is terminated or revoked sooner. Performed at [facility], [place] [address] [place], [place] [address]   SARS CORONAVIRUS 2 (TAT 6-24 HRS) Nasopharyngeal Nasopharyngeal Swab     Status: None   Collection Time: 12/26/18  2:30 PM   Specimen: Nasopharyngeal Swab  Result Value Ref Range Status   SARS Coronavirus 2 NEGATIVE NEGATIVE Final    Comment: (NOTE) SARS-CoV-2 target nucleic acids are NOT DETECTED. The SARS-CoV-2 RNA is generally detectable in upper and lower respiratory specimens during the acute phase of infection. Negative results do not preclude SARS-CoV-2 infection, do not rule out co-infections with other pathogens, and should not be used as the sole basis for treatment or other patient management decisions. Negative results must be combined with clinical observations, patient history, and epidemiological information. The expected result is Negative. Fact Sheet for Patients: SugarRoll.be Fact Sheet for Healthcare Providers: https://www.woods-mathews.com/ This test is not yet approved or cleared by the [place] FDA and  has been authorized for detection and/or diagnosis of SARS-CoV-2 by FDA under an Emergency Use Authorization (EUA). This EUA will remain  in effect (meaning this test can be used) for the duration of the COVID-19 declaration under Section 56 4(b)(1) of the Act, 21 U.S.C. section  360bbb-3(b)(1), unless the authorization is terminated or revoked sooner. Performed at [facility], [place] [address], [place], [place] [address]   SARS CORONAVIRUS 2 (TAT 6-24 HRS) Nasopharyngeal Nasopharyngeal Swab     Status: None   Collection Time: 12/31/18 12:16 PM   Specimen: Nasopharyngeal Swab  Result Value Ref Range Status   SARS Coronavirus 2 NEGATIVE NEGATIVE Final    Comment: (NOTE) SARS-CoV-2 target nucleic acids are NOT DETECTED. The SARS-CoV-2 RNA is generally detectable in upper and lower respiratory specimens during the acute phase of infection. Negative results do not  preclude SARS-CoV-2 infection, do not rule out co-infections with other pathogens, and should not be used as the sole basis for treatment or other patient management decisions. Negative results must be combined with clinical observations, patient history, and epidemiological information. The expected result is Negative. Fact Sheet for Patients: SugarRoll.be Fact Sheet for Healthcare Providers: https://www.woods-mathews.com/ This test is not yet approved or cleared by the [place] FDA and  has been authorized for detection and/or diagnosis of SARS-CoV-2 by FDA under an Emergency Use Authorization (EUA). This EUA will remain  in effect (meaning this test can be used) for the duration of the COVID-19 declaration under Section 56 4(b)(1) of the Act, 21 U.S.C. section 360bbb-3(b)(1), unless the authorization is terminated or revoked sooner. Performed at [facility], [place] [address], [place], [place] [address]      Radiology Studies: No results found.   Scheduled Meds: . ammonium lactate  1 application Topical BID  . atorvastatin  40 mg Oral q1800  . carvedilol  3.125 mg Oral BID WC  . Chlorhexidine Gluconate Cloth  6 each Topical Daily  . feeding supplement  1 Container Oral BID BM  . feeding supplement (ENSURE ENLIVE)  237 mL Oral Q24H  .  feeding supplement (PRO-STAT SUGAR FREE 64)  30 mL Oral BID  . fluticasone  2 spray Each Nare Daily  . folic acid  1 mg Oral Daily  . insulin aspart  0-9 Units Subcutaneous Q4H  . lactulose  20 g Oral TID  . mirtazapine  30 mg Oral QHS  . multivitamin with minerals  1 tablet Oral Daily  . pantoprazole  40 mg Oral BID  . sodium chloride flush  3 mL Intravenous Q12H  . sodium chloride flush  3 mL Intravenous Q12H  . spironolactone  12.5 mg Oral Daily  . torsemide  20 mg Oral Daily   Continuous Infusions: . sodium chloride Stopped (12/20/18 0943)  . sodium chloride       LOS: 16 days    Time spent:     Alvis Edgell Harmon Pier, MD [facility]   If 7PM-7AM, please contact night-coverage

## 2019-01-01 NOTE — Progress Notes (Signed)
Physical Therapy Treatment Patient Details Name: [name] MRN: [id number] DOB: [birth year] Today's Date: 01/01/2019    History of Present Illness 17 female with a past medical history of COPD with ongoing tobacco abuse, history of gastric ulcer, AVMs, diabetes mellitus, chronic back pain, CHF, iron deficiency anemia due to chronic blood loss, chronic systolic heart failure, history of coronary artery disease, chronic hepatitis C and admitted for Acute GI bleed/hematochezia    PT Comments    Patient resting in bed at start of session and agreeable to participate in therapy. She has improved ability to complete bed mobility without assistance but continues to mobilize slowly and requires cues and extra time for sequence transfers. Pt instructed on safe use of RW for sit<>stands and gait and required min assist for 2 short bouts of gait within room. Repeated sit<>stands performed for LE strengthening and pt required repeated encouragement to deter excessive rest breaks. Pt will continue to benefit from skilled PT interventions to address impairments and progress functional independence with mobility. Acute PT will progress as able.    Follow Up Recommendations  SNF;Supervision/Assistance - 24 hour     Equipment Recommendations  None recommended by PT    Recommendations for Other Services       Precautions / Restrictions Precautions Precautions: Fall Restrictions Weight Bearing Restrictions: No    Mobility  Bed Mobility Overal bed mobility: Needs Assistance Bed Mobility: Supine to Sit     Supine to sit: Min guard;HOB elevated     General bed mobility comments: no assist or cues required for bed mob; pt taking extra time to scoot forward and palce feet on floor.  Transfers Overall transfer level: Needs assistance Equipment used: Rolling walker (2 wheeled) Transfers: Sit to/from Stand Sit to Stand: Min assist;From elevated surface         General transfer comment:  min assist for raised EOB to perform sit<>stand. assist requried for power up and to complete rise and verbal/tactile cues required for hand placement and technique with RW.  Ambulation/Gait Ambulation/Gait assistance: Min assist Gait Distance (Feet): 22 Feet(1x 12', 1x 10') Assistive device: Rolling walker (2 wheeled) Gait Pattern/deviations: Shuffle;Step-to pattern;Trunk flexed;Decreased stride length Gait velocity: decreased   General Gait Details: pt slow to ambulate and required min assist to steady and maintain safe proximity to RW. verbal cues for step pattern within RW and to keep walker close throughout turn to sit in chair. pt limited by weakness and dizziness with gait, reported dizziness subsided once she sat down.   Stairs             Wheelchair Mobility    Modified Rankin (Stroke Patients Only)       Balance Overall balance assessment: Needs assistance Sitting-balance support: Feet supported;No upper extremity supported Sitting balance-Leahy Scale: Fair     Standing balance support: Bilateral upper extremity supported;During functional activity Standing balance-Leahy Scale: Poor             Cognition Arousal/Alertness: Awake/alert   Overall Cognitive Status: No family/caregiver present to determine baseline cognitive functioning Area of Impairment: Following commands;Safety/judgement;Problem solving        Following Commands: Follows one step commands with increased time;Follows multi-step commands with increased time Safety/Judgement: Decreased awareness of safety   Problem Solving: Slow processing;Decreased initiation;Difficulty sequencing;Requires verbal cues General Comments: pt with continued improved verbalizations; expressed concerns for how difficult mobility has become; continues to take extra time for processing and requests breaks due to weakness      Exercises  Other Exercises Other Exercises: 2 sets of 5 reps for Sit<>Stand. pt using  bil UE support on armrests to initiate power up and RW to steady self upon rising. PT required encouragement and cues to continue exercise and frequently rested on Lt armrest after each repetition.    General Comments        Pertinent Vitals/Pain Pain Assessment: No/denies pain           PT Goals (current goals can now be found in the care plan section) Acute Rehab PT Goals Patient Stated Goal: none stated PT Goal Formulation: Patient unable to participate in goal setting Time For Goal Achievement: 01/07/19 Potential to Achieve Goals: Fair Progress towards PT goals: Progressing toward goals    Frequency    Min 2X/week      PT Plan Current plan remains appropriate       AM-PAC PT "6 Clicks" Mobility   Outcome Measure  Help needed turning from your back to your side while in a flat bed without using bedrails?: A Little Help needed moving from lying on your back to sitting on the side of a flat bed without using bedrails?: A Little Help needed moving to and from a bed to a chair (including a wheelchair)?: A Little Help needed standing up from a chair using your arms (e.g., wheelchair or bedside chair)?: A Little Help needed to walk in hospital room?: A Little Help needed climbing 3-5 steps with a railing? : A Lot 6 Click Score: 17    End of Session Equipment Utilized During Treatment: Gait belt Activity Tolerance: Patient tolerated treatment well Patient left: in bed;with call bell/phone within reach;with bed alarm set Nurse Communication: Mobility status PT Visit Diagnosis: Other abnormalities of gait and mobility (R26.89);Muscle weakness (generalized) (M62.81)     Time: 7782-4235 PT Time Calculation (min) (ACUTE ONLY): 32 min  Charges:  $Gait Training: 8-22 mins $Therapeutic Exercise: 8-22 mins                    Gwynneth Albright PT, DPT Physical Therapist with [facility]  01/01/2019 1:20 PM

## 2019-01-02 LAB — BASIC METABOLIC PANEL
Anion gap: 13 (ref 5–15)
BUN: 52 mg/dL — ABNORMAL HIGH (ref 8–23)
CO2: 25 mmol/L (ref 22–32)
Calcium: 8.9 mg/dL (ref 8.9–10.3)
Chloride: 99 mmol/L (ref 98–111)
Creatinine, Ser: 2.27 mg/dL — ABNORMAL HIGH (ref 0.44–1.00)
GFR calc Af Amer: 26 mL/min — ABNORMAL LOW (ref >60)
GFR calc non Af Amer: 22 mL/min — ABNORMAL LOW (ref >60)
Glucose, Bld: 121 mg/dL — ABNORMAL HIGH (ref 70–99)
Potassium: 4.2 mmol/L (ref 3.5–5.1)
Sodium: 137 mmol/L (ref 135–145)

## 2019-01-02 LAB — CBC
HCT: 23.9 % — ABNORMAL LOW (ref 36.0–46.0)
Hemoglobin: 7.2 g/dL — ABNORMAL LOW (ref 12.0–15.0)
MCH: 27.1 pg (ref 26.0–34.0)
MCHC: 30.1 g/dL (ref 30.0–36.0)
MCV: 89.8 fL (ref 80.0–100.0)
Platelets: 190 10*3/uL (ref 150–400)
RBC: 2.66 MIL/uL — ABNORMAL LOW (ref 3.87–5.11)
RDW: 16.4 % — ABNORMAL HIGH (ref 11.5–15.5)
WBC: 7.9 10*3/uL (ref 4.0–10.5)
nRBC: 0 % (ref 0.0–0.2)

## 2019-01-02 LAB — GLUCOSE, CAPILLARY
Glucose-Capillary: 132 mg/dL — ABNORMAL HIGH (ref 70–99)
Glucose-Capillary: 192 mg/dL — ABNORMAL HIGH (ref 70–99)
Glucose-Capillary: 214 mg/dL — ABNORMAL HIGH (ref 70–99)

## 2019-01-02 NOTE — Progress Notes (Signed)
Nutrition Follow-up  RD working remotely.   DOCUMENTATION CODES:   Not applicable  INTERVENTION:  - continue Boost Breeze BID and Ensure Enlive BID. - continue to encourage PO intakes.    NUTRITION DIAGNOSIS:   Increased nutrient needs related to wound healing, acute illness as evidenced by estimated needs. -ongoing  GOAL:   Patient will meet greater than or equal to 90% of their needs -minimally met on average   MONITOR:   PO intake, Supplement acceptance, Labs, Weight trends  ASSESSMENT:   50 female with a past medical history of COPD with ongoing tobacco abuse, gastric ulcer, AVMs, DM, chronic back pain, CHF, iron deficiency anemia due to chronic blood loss, CHF, CAD, and chronic hepatitis C. She presented to the ED from nursing facility for 2 day hx of maroon-colored, bloody BMs. She was hospitalized 11/3-11/20 with GIB and underwent extensive GI evaluation at that time. She has been transfused with 4 units PRBCs and GI consulted. She underwent re-enteroscopy on 12/1 and no active bleeding noted.  Diet advanced from CLD to [place] on 12/11 at 1616 and to Soft on 12/13 at 1704 then to Low sodium 12/15 at 0000. Per flow sheet documentation, she consumed 100% of lunch and dinner on 12/12, 75% of dinner on 12/13, and 70% of lunch on 12/15. She has been accepting Boost Breeze 90% of the time offered and Ensure Enlive 50% of the time.  Per notes: - acute GIB/hematochezia s/p enteroscopy x3--stable; plan for outpatient repeat colonoscopy - mild thrombocytopenia d/t liver cirrhosis - debility/deconditioning    Labs reviewed; CBG: 132 mg/dl, BUN: 52 mg/dl, creatinine: 2.27 mg/dl, GFR: 26 ml/min. Medications reviewed; 1 folvite/day, sliding scale novolog, 20 g lactulose TID, daily multivitamin with minerals, 40 mg protonix BID, 12.5 mg aldactone/day.     Diet Order:   Diet Order            Diet - low sodium heart healthy        DIET SOFT Room service appropriate? Yes; Fluid  consistency: Thin  Diet effective now        Diet - low sodium heart healthy              EDUCATION NEEDS:   Not appropriate for education at this time  Skin:  Skin Assessment: Skin Integrity Issues: Skin Integrity Issues:: DTI, Stage II DTI: R heel Stage II: sacrum  Last BM:  12/15  Height:   Ht Readings from Last 1 Encounters:  12/19/18 5' 5"  (1.651 m)    Weight:   Wt Readings from Last 1 Encounters:  01/02/19 77.5 kg    Ideal Body Weight:  56.8 kg  BMI:  Body mass index is 28.43 kg/m.  Estimated Nutritional Needs:   Kcal:  1800-2000 kcal  Protein:  90-105 grams  Fluid:  >/= 1.8 L/day      Jarome Matin, MS, RD, LDN, [facility] Inpatient Clinical Dietitian Pager # [phone] After hours/weekend pager # [phone]

## 2019-01-02 NOTE — TOC Progression Note (Signed)
Transition of Care [facility]) - Progression Note    Patient Details  Name: [name] MRN: [id number] Date of Birth: 02/15/1955  [facility] CM/SW Contact  Deondrea Markos, Juliann Pulse, RN Phone Number: 01/02/2019, 9:59 AM  Clinical Narrative:  Rep Freda Munro from [facility] concerned about readmissions prior to checking for acceptance. Faxed through the hub d/c summary.     Expected Discharge Plan: Skilled Nursing Facility Barriers to Discharge: Other (comment)(waiting on SNF [facility] to decide if able to accept back since patient has many readmissions within 24hrs.)  Expected Discharge Plan and Services Expected Discharge Plan: [place] In-house Referral: Clinical Social Work Discharge Planning Services: CM Consult   Living arrangements for the past 2 months: [place] Expected Discharge Date: 01/02/19                                     Social Determinants of Health (SDOH) Interventions    Readmission Risk Interventions Readmission Risk Prevention Plan 12/17/2018 10/26/2018 08/21/2018  Transportation Screening Complete Complete Complete  PCP or Specialist Appt within 5-7 Days - - -  Home Care Screening - - -  Medication Review (RN CM) - - -  [place] or [place] - Complete Complete  Social Work Consult for [place] Planning/Counseling - Not Complete -  SW consult not completed comments - not recovery care -  Palliative Care Screening - Not Applicable Not Applicable  Medication Review (RN Care Manager) Referral to Pharmacy Referral to Pharmacy Complete  PCP or Specialist appointment within 3-5 days of discharge Not Complete - -  PCP/Specialist Appt Not Complete comments DC date unknown but pt is established with providers - -  [place] or [place] Not Complete Complete -  [place] or Home Care Consult Pt Refusal Comments SNF resident - -  SW Recovery Care/Counseling Consult Complete - -  Palliative Care Screening Not  Complete Not Applicable -  Comments pending need - -  Skilled Nursing Facility Complete - -  Some recent data might be hidden

## 2019-01-02 NOTE — Discharge Summary (Signed)
Physician Discharge Summary  [name] HYW:[id number] DOB: 1955/10/09 DOA: 12/16/2018  PCP: [place] date: 12/16/2018 Discharge date: 01/02/2019  Admitted From:   Disposition:    Recommendations for Outpatient Follow-up:  1. Follow up with PCP in 1-2 weeks 2. Please obtain BMP/CBC in one week 3. Please follow up on the following pending results:  Home Health:  Equipment/Devices:  Discharge Condition: stable   CODE STATUS: full   Diet recommendation: Heart Healthy     Brief/Interim Summary:  Patient is a 14 female with a past medical history of COPD with ongoing tobacco abuse, history of gastric ulcer, AVMs, diabetes mellitus, chronic back pain, CHF, iron deficiency anemia due to chronic blood loss, chronic systolic heart failure, history of coronary artery disease, chronic hepatitis C ,who presents from nursing facility for the evaluation of 2-day history of maroon-colored bloody bowel movements. She was recently hospitalized here from 11/3-11/20 for GI bleed. At that time she underwent extensive GI evaluation. When she presented here this time, her hemoglobin was 4.8. She has been transfused with PRBCs. Total of 4 units. 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 APC applied . She was having maroon-colored stools after the procedure. So she underwent reenteroscopy on 12/19/18. Not found to have active bleeding.  She again had maroon-colored liquidy stools on 12/19/18 night . She underwent balloon enteroscopy with no finding of active bleeding source.  For 3 days post balloon enteroscopy , she didnot not have any bloody bowel movements. Her hemoglobin was stable. Plan was to discharge to skilled nursing facility on 12/25/18 but she had a moderate amount of bloody bowel movement on that day so discharge canceled. Underwentcapsule endoscopy with finding of bleeding in the  area of cecum.  She underwent colonoscopy yesterday found to have 2 bleeding colonic angiectasias in the transverse colon, treated with APC and clips.  Hemoglobin has been stable around 7.2 for past 3 days   On Dec 16 , pt was feeling fine, did not have any complains and was discharged back to nursing home to follow with pcp,gi in 1-2 weeks.   During her stay    # Acute GI bleed/hematochezia:She presented with maroon-colored stools for 2 days from skilled nursing facility. FOBT positive. Hemoglobin was 4.8 on admission. Recently hospitalized this month and underwent GI evaluation with finding of AVM as noted on capsule endoscopy. She underwent studies with colonoscopy, enteroscopy, capsule study, tagged RBC scan. underwent transfusion with 5 unit of PRBC on last admission. When she was discharged home her hemoglobin was in the range of 10. She also has history of gastritis/gastroenteritis. On this admission, she has been transfused with 4 units of PRBC. 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 APC applied . She was having maroon-colored stools after the procedure. So she underwent reenteroscopy on 12/19/18. Not found to have active bleeding.  She again had maroon-colored liquidy stools on 12/19/18 night . She underwent balloon enteroscopy with no finding of active bleeding source.  For 3 days post balloon enteroscopy , she didnot not have any bloody bowel movements. Her hemoglobin was stable. Plan was to discharge to skilled nursing facility on 12/25/18 but she had a moderate amount of bloody bowel movement on that day so discharge canceled. Underwentcapsule endoscopy with finding of bleeding in the area of cecum. She underwent colonoscopy 12/11 found to have 2 bleeding colonic angiectasias  in the transverse colon, treated with APC and clips.   Hemoglobin dropped from 8.4-7.1.  GI recalled.  Patient was seen by  GI: repeat CBC shows hemoglobin 7.5. GI: Patient remains hemodynamically stable.  She can be discharged and will follow up with Dr. Marius Ditch after discharge for repeat colonoscopy with polypectomy of several polyps.  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 Denies any chest pain or shortness of breath. Has trace edema on bilateral lower extremities. Continue patient's home regimen of Coreg, Aldactone, Demadex, Lipitor. Monitor input/output  # CKD stage 3:Baseline Creatinine fluctuates from 1.5-2.5. Currently at baseline.  # COPD with ongoing tobacco abuse: On Dulera, Spiriva. Continue bronchodilators as needed.  # History of hepatic encephalopathy: Noted on prior hospitalization. History of hepatitis C/cirrhosis. On lactulose. Ammonia level is normal.Has mild thrombocytopenia due to liver cirrhosis  # GERD:Continue PPI  # Hypertension:Currently blood pressure stable. Continue Coreg, spironolactone, Demadex  Diabetes type 2:Well controlled. Hemoglobin A1c of 5.9 on 06/21/2018. Continue sliding-scale insulin Repeat  A1c on 12/15 is 5.5  Blood sugars are fluctuating. Will control with diet.  Patient might have low hemoglobin A1c due to her underlying liver disease. Might need some oral agent as she has uncontrolled hyperglycemia.   Folic acid deficiency:Continue folate.  Debility/deconditioning/confusion at baseline:Nursing home resident. She is alert, awake but oriented to place and person only. She states she ambulates with the help of walker. Confused on baseline. History of hepatic encephalopathy but ammonia is normal. Continue supportive care.  Pressure Injury 12/16/18 Sacrum Mid Stage II - Partial thickness loss of dermis presenting as a shallow open ulcer with a red, pink wound bed without slough. (Active)  12/16/18 1749  Location: Sacrum  Location Orientation: Mid  Staging: Stage II -  Partial thickness loss of dermis presenting as a shallow open ulcer with a red, pink wound bed without slough.  Wound Description (Comments):   Present on Admission: Yes    Pressure Injury 12/16/18 Heel Right Deep Tissue Injury - Purple or maroon localized area of discolored intact skin or blood-filled blister due to damage of underlying soft tissue from pressure and/or shear. (Active)  12/16/18   Location: Heel  Location Orientation: Right  Staging: Deep Tissue Injury - Purple or maroon localized area of discolored intact skin or blood-filled blister due to damage of underlying soft tissue from pressure and/or shear.  Wound Description (Comments):   Present on Admission: Yes      Discharge Diagnoses:  Principal Problem:   Lower GI bleeding Active Problems:   Chronic systolic heart failure (HCC)   Diabetes (HCC)   COPD (chronic obstructive pulmonary disease) (HCC)   Symptomatic anemia   Upper GI bleed   B12 deficiency   Iron deficiency anemia due to chronic blood loss   Folate deficiency   Anxiety   CAD (coronary artery disease)   Chronic back pain   Depression   Diabetes mellitus type 2, uncontrolled (HCC)   GERD (gastroesophageal reflux disease)   Hyperlipidemia   Hypertension   Obesity   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HCC)   Gastritis and gastroduodenitis   AVM (arteriovenous malformation) of small bowel, acquired with hemorrhage    Discharge Instructions  Discharge Instructions    Diet - low sodium heart healthy   Complete by: As directed    Diet - low sodium heart healthy   Complete by: As directed    Diet - low sodium heart healthy   Complete by: As directed    Diet -  low sodium heart healthy   Complete by: As directed    Boost bid otc and ensure liv bid prescribed   Discharge instructions   Complete by: As directed    1)Take medications as instructed. 2)Check a CBC and BMP test in a week.   Increase activity slowly   Complete by: As directed     Increase activity slowly   Complete by: As directed    Increase activity slowly   Complete by: As directed    Increase activity slowly   Complete by: As directed      Allergies as of 01/02/2019      Reactions   Latex Anaphylaxis, Rash   Gabapentin Nausea And Vomiting, Other (See Comments)      Medication List    TAKE these medications   ammonium lactate 12 % cream Commonly known as: AMLACTIN Apply 1 g topically 2 (two) times daily.   atorvastatin 40 MG tablet Commonly known as: LIPITOR Take 40 mg by mouth daily.   carvedilol 3.125 MG tablet Commonly known as: COREG Take 1 tablet (3.125 mg total) by mouth 2 (two) times daily with a meal.   feeding supplement (PRO-STAT SUGAR FREE 64) Liqd Take 30 mLs by mouth 2 (two) times daily.   fluticasone 50 MCG/ACT nasal spray Commonly known as: FLONASE Place 2 sprays into both nostrils daily.   folic acid 1 MG tablet Commonly known as: FOLVITE Take 1 tablet (1 mg total) by mouth daily.   lactulose 10 GM/15ML solution Commonly known as: CHRONULAC Take 30 mLs (20 g total) by mouth 3 (three) times daily.   mirtazapine 30 MG disintegrating tablet Commonly known as: REMERON SOL-TAB Take 1 tablet (30 mg total) by mouth at bedtime.   multivitamin with minerals Tabs tablet Take 1 tablet by mouth daily.   pantoprazole 40 MG tablet Commonly known as: PROTONIX Take 1 tablet (40 mg total) by mouth 2 (two) times daily. What changed: when to take this   RESOURCE 2.0 PO Take 1 Bottle by mouth 2 (two) times daily.   senna-docusate 8.6-50 MG tablet Commonly known as: Senokot-S Take 1 tablet by mouth at bedtime as needed for mild constipation.   spironolactone 25 MG tablet Commonly known as: ALDACTONE Take 12.5 mg by mouth daily.   torsemide 20 MG tablet Commonly known as: DEMADEX Take 20 mg by mouth daily. Take 2 tablets if weight is greater than 185 lbs. NO tablets if weight is less than 177 lbs.      Follow-up  Information    [place] Schedule an appointment as soon as possible for a visit in 1 week(s).   Contact information: [place] [id number] [phone]          Allergies  Allergen Reactions  . Latex Anaphylaxis and Rash  . Gabapentin Nausea And Vomiting and Other (See Comments)    Consultations:     Procedures/Studies:  No results found.    Subjective:   Discharge Exam: Vitals:   01/01/19 2012 01/02/19 0540  BP: (!) 113/50 130/62  Pulse: 80 73  Resp: 18 18  Temp: 98 F (36.7 C) 98 F (36.7 C)  SpO2: 100% 100%   Vitals:   01/01/19 1415 01/01/19 2012 01/02/19 0540 01/02/19 0600  BP: (!) 119/51 (!) 113/50 130/62   Pulse: 77 80 73   Resp: 18 18 18    Temp: 97.8 F (36.6 C) 98 F (36.7 C) 98 F (36.7 C)   TempSrc:  SpO2: 100% 100% 100%   Weight:    77.5 kg  Height:        General: Pt is alert, awake, not in acute distress Cardiovascular: RRR, S1/S2 +, no rubs, no gallops Respiratory: CTA bilaterally, no wheezing, no rhonchi Abdominal: Soft, NT, ND, bowel sounds + Extremities: no edema, no cyanosis    The results of significant diagnostics from this hospitalization (including imaging, microbiology, ancillary and laboratory) are listed below for reference.     Microbiology: Recent Results (from the past 240 hour(s))  SARS CORONAVIRUS 2 (TAT 6-24 HRS) Nasopharyngeal Nasopharyngeal Swab     Status: None   Collection Time: 12/23/18  1:56 PM   Specimen: Nasopharyngeal Swab  Result Value Ref Range Status   SARS Coronavirus 2 NEGATIVE NEGATIVE Final    Comment: (NOTE) SARS-CoV-2 target nucleic acids are NOT DETECTED. The SARS-CoV-2 RNA is generally detectable in upper and lower respiratory specimens during the acute phase of infection. Negative results do not preclude SARS-CoV-2 infection, do not rule out co-infections with other pathogens, and should not be used as the sole basis for treatment or other patient  management decisions. Negative results must be combined with clinical observations, patient history, and epidemiological information. The expected result is Negative. Fact Sheet for Patients: SugarRoll.be Fact Sheet for Healthcare Providers: https://www.woods-mathews.com/ This test is not yet approved or cleared by the [place] FDA and  has been authorized for detection and/or diagnosis of SARS-CoV-2 by FDA under an Emergency Use Authorization (EUA). This EUA will remain  in effect (meaning this test can be used) for the duration of the COVID-19 declaration under Section 56 4(b)(1) of the Act, 21 U.S.C. section 360bbb-3(b)(1), unless the authorization is terminated or revoked sooner. Performed at [facility], [place] [address], [place], [place] [address]   SARS CORONAVIRUS 2 (TAT 6-24 HRS) Nasopharyngeal Nasopharyngeal Swab     Status: None   Collection Time: 12/26/18  2:30 PM   Specimen: Nasopharyngeal Swab  Result Value Ref Range Status   SARS Coronavirus 2 NEGATIVE NEGATIVE Final    Comment: (NOTE) SARS-CoV-2 target nucleic acids are NOT DETECTED. The SARS-CoV-2 RNA is generally detectable in upper and lower respiratory specimens during the acute phase of infection. Negative results do not preclude SARS-CoV-2 infection, do not rule out co-infections with other pathogens, and should not be used as the sole basis for treatment or other patient management decisions. Negative results must be combined with clinical observations, patient history, and epidemiological information. The expected result is Negative. Fact Sheet for Patients: SugarRoll.be Fact Sheet for Healthcare Providers: https://www.woods-mathews.com/ This test is not yet approved or cleared by the [place] FDA and  has been authorized for detection and/or diagnosis of SARS-CoV-2 by FDA under an Emergency Use Authorization  (EUA). This EUA will remain  in effect (meaning this test can be used) for the duration of the COVID-19 declaration under Section 56 4(b)(1) of the Act, 21 U.S.C. section 360bbb-3(b)(1), unless the authorization is terminated or revoked sooner. Performed at [facility], [place] [address], [place], [place] [address]   SARS CORONAVIRUS 2 (TAT 6-24 HRS) Nasopharyngeal Nasopharyngeal Swab     Status: None   Collection Time: 12/31/18 12:16 PM   Specimen: Nasopharyngeal Swab  Result Value Ref Range Status   SARS Coronavirus 2 NEGATIVE NEGATIVE Final    Comment: (NOTE) SARS-CoV-2 target nucleic acids are NOT DETECTED. The SARS-CoV-2 RNA is generally detectable in upper and lower respiratory specimens during the acute phase of  infection. Negative results do not preclude SARS-CoV-2 infection, do not rule out co-infections with other pathogens, and should not be used as the sole basis for treatment or other patient management decisions. Negative results must be combined with clinical observations, patient history, and epidemiological information. The expected result is Negative. Fact Sheet for Patients: SugarRoll.be Fact Sheet for Healthcare Providers: https://www.woods-mathews.com/ This test is not yet approved or cleared by the [place] FDA and  has been authorized for detection and/or diagnosis of SARS-CoV-2 by FDA under an Emergency Use Authorization (EUA). This EUA will remain  in effect (meaning this test can be used) for the duration of the COVID-19 declaration under Section 56 4(b)(1) of the Act, 21 U.S.C. section 360bbb-3(b)(1), unless the authorization is terminated or revoked sooner. Performed at [facility], [place] [address], [place], [place] [address]      Labs: BNP (last 3 results) Recent Labs    01/13/18 1148 01/15/18 0421 08/17/18 1030  BNP 1,068.0* 596.0* 967.8*   Basic Metabolic Panel: Recent Labs  Lab  12/29/18 0549 12/30/18 0524 12/31/18 0514 01/01/19 0954 01/02/19 0549  NA 138 135 133* 134* 137  K 3.8 3.9 3.9 3.9 4.2  CL 100 99 97* 98 99  CO2 23 25 25 23 25   GLUCOSE 148* 138* 159* 194* 121*  BUN 36* 40* 44* 45* 52*  CREATININE 1.88* 1.92* 2.21* 2.21* 2.27*  CALCIUM 9.0 8.6* 8.8* 8.9 8.9  MG 1.9  --   --   --   --   PHOS 4.3  --   --   --   --    Liver Function Tests: Recent Labs  Lab 12/29/18 0549  AST 22  ALT 14  ALKPHOS 105  BILITOT 1.0  PROT 8.7*  ALBUMIN 3.1*   No results for input(s): LIPASE, AMYLASE in the last 168 hours. No results for input(s): AMMONIA in the last 168 hours. CBC: Recent Labs  Lab 12/27/18 0523 12/28/18 0523 12/30/18 0524 12/30/18 0829 12/30/18 1405 12/31/18 0514 01/01/19 0954 01/02/19 0549  WBC 7.1 5.9 8.8 9.5  --  10.1 10.3 7.9  NEUTROABS 4.5 3.8  --   --   --   --  7.9*  --   HGB 7.3* 8.1* 7.1* 7.7* 7.5* 7.3* 7.6* 7.2*  HCT 24.1* 27.3* 23.8* 26.1* 24.9* 24.8* 25.1* 23.9*  MCV 92.0 91.0 90.8 91.3  --  91.5 90.9 89.8  PLT 132* 149* 155 159  --  164 172 190   Cardiac Enzymes: No results for input(s): CKTOTAL, CKMB, CKMBINDEX, TROPONINI in the last 168 hours. BNP: Invalid input(s): POCBNP CBG: Recent Labs  Lab 01/01/19 1144 01/01/19 1619 01/01/19 2009 01/02/19 0001 01/02/19 0409  GLUCAP 212* 149* 183* 214* 132*   D-Dimer No results for input(s): DDIMER in the last 72 hours. Hgb A1c Recent Labs    01/01/19 0954  HGBA1C 5.5   Lipid Profile No results for input(s): CHOL, HDL, LDLCALC, TRIG, CHOLHDL, LDLDIRECT in the last 72 hours. Thyroid function studies No results for input(s): TSH, T4TOTAL, T3FREE, THYROIDAB in the last 72 hours.  Invalid input(s): FREET3 Anemia work up No results for input(s): VITAMINB12, FOLATE, FERRITIN, TIBC, IRON, RETICCTPCT in the last 72 hours. Urinalysis    Component Value Date/Time   COLORURINE YELLOW 11/20/2018 1320   APPEARANCEUR CLEAR 11/20/2018 1320   APPEARANCEUR Clear  08/03/2013 2138   LABSPEC 1.009 11/20/2018 1320   LABSPEC 1.028 08/03/2013 2138   PHURINE 7.0 11/20/2018 1320   GLUCOSEU NEGATIVE 11/20/2018  1320   GLUCOSEU Negative 08/03/2013 2138   HGBUR NEGATIVE 11/20/2018 1320   BILIRUBINUR NEGATIVE 11/20/2018 1320   BILIRUBINUR Negative 08/03/2013 2138   KETONESUR NEGATIVE 11/20/2018 1320   PROTEINUR NEGATIVE 11/20/2018 1320   UROBILINOGEN 0.2 09/27/2006 2132   NITRITE NEGATIVE 11/20/2018 1320   LEUKOCYTESUR NEGATIVE 11/20/2018 1320   LEUKOCYTESUR Negative 08/03/2013 2138   Sepsis Labs Invalid input(s): PROCALCITONIN,  WBC,  LACTICIDVEN Microbiology Recent Results (from the past 240 hour(s))  SARS CORONAVIRUS 2 (TAT 6-24 HRS) Nasopharyngeal Nasopharyngeal Swab     Status: None   Collection Time: 12/23/18  1:56 PM   Specimen: Nasopharyngeal Swab  Result Value Ref Range Status   SARS Coronavirus 2 NEGATIVE NEGATIVE Final    Comment: (NOTE) SARS-CoV-2 target nucleic acids are NOT DETECTED. The SARS-CoV-2 RNA is generally detectable in upper and lower respiratory specimens during the acute phase of infection. Negative results do not preclude SARS-CoV-2 infection, do not rule out co-infections with other pathogens, and should not be used as the sole basis for treatment or other patient management decisions. Negative results must be combined with clinical observations, patient history, and epidemiological information. The expected result is Negative. Fact Sheet for Patients: SugarRoll.be Fact Sheet for Healthcare Providers: https://www.woods-mathews.com/ This test is not yet approved or cleared by the [place] FDA and  has been authorized for detection and/or diagnosis of SARS-CoV-2 by FDA under an Emergency Use Authorization (EUA). This EUA will remain  in effect (meaning this test can be used) for the duration of the COVID-19 declaration under Section 56 4(b)(1) of the Act, 21 U.S.C. section  360bbb-3(b)(1), unless the authorization is terminated or revoked sooner. Performed at [facility], [place] [address], [place], [place] [address]   SARS CORONAVIRUS 2 (TAT 6-24 HRS) Nasopharyngeal Nasopharyngeal Swab     Status: None   Collection Time: 12/26/18  2:30 PM   Specimen: Nasopharyngeal Swab  Result Value Ref Range Status   SARS Coronavirus 2 NEGATIVE NEGATIVE Final    Comment: (NOTE) SARS-CoV-2 target nucleic acids are NOT DETECTED. The SARS-CoV-2 RNA is generally detectable in upper and lower respiratory specimens during the acute phase of infection. Negative results do not preclude SARS-CoV-2 infection, do not rule out co-infections with other pathogens, and should not be used as the sole basis for treatment or other patient management decisions. Negative results must be combined with clinical observations, patient history, and epidemiological information. The expected result is Negative. Fact Sheet for Patients: SugarRoll.be Fact Sheet for Healthcare Providers: https://www.woods-mathews.com/ This test is not yet approved or cleared by the [place] FDA and  has been authorized for detection and/or diagnosis of SARS-CoV-2 by FDA under an Emergency Use Authorization (EUA). This EUA will remain  in effect (meaning this test can be used) for the duration of the COVID-19 declaration under Section 56 4(b)(1) of the Act, 21 U.S.C. section 360bbb-3(b)(1), unless the authorization is terminated or revoked sooner. Performed at [facility], [place] [address], [place], [place] [address]   SARS CORONAVIRUS 2 (TAT 6-24 HRS) Nasopharyngeal Nasopharyngeal Swab     Status: None   Collection Time: 12/31/18 12:16 PM   Specimen: Nasopharyngeal Swab  Result Value Ref Range Status   SARS Coronavirus 2 NEGATIVE NEGATIVE Final    Comment: (NOTE) SARS-CoV-2 target nucleic acids are NOT DETECTED. The SARS-CoV-2 RNA is generally  detectable in upper and lower respiratory specimens during the acute phase of infection. Negative results do not preclude SARS-CoV-2 infection, do not rule  out co-infections with other pathogens, and should not be used as the sole basis for treatment or other patient management decisions. Negative results must be combined with clinical observations, patient history, and epidemiological information. The expected result is Negative. Fact Sheet for Patients: SugarRoll.be Fact Sheet for Healthcare Providers: https://www.woods-mathews.com/ This test is not yet approved or cleared by the [place] FDA and  has been authorized for detection and/or diagnosis of SARS-CoV-2 by FDA under an Emergency Use Authorization (EUA). This EUA will remain  in effect (meaning this test can be used) for the duration of the COVID-19 declaration under Section 56 4(b)(1) of the Act, 21 U.S.C. section 360bbb-3(b)(1), unless the authorization is terminated or revoked sooner. Performed at [facility], [place] [address] [place], [place] [address]      Time coordinating discharge: Over 30 minutes  SIGNED:   Vicenta Dunning, MD  [facility] 01/02/2019, 9:18 AM Pager   If 7PM-7AM, please contact night-coverage [email or web address] Password [email or web address]

## 2019-02-05 ENCOUNTER — Ambulatory Visit: Payer: Medicaid Other | Admitting: Gastroenterology

## 2019-02-06 ENCOUNTER — Telehealth: Payer: Self-pay

## 2019-02-06 NOTE — Telephone Encounter (Signed)
Line rings then goes to a fast busy will mail a letter

## 2019-02-06 NOTE — Telephone Encounter (Signed)
[name], Thanks for the update. Can you please also make sure that the patient's PCP office knows that we have tried to get the patient in for follow-up and that we have been unsuccessful.  I just want to make sure that they are aware of this large polyp that needs resection at some point. Thanks. GM

## 2019-02-06 NOTE — Telephone Encounter (Signed)
-----   Message from Irving Copas., MD sent at 02/06/2019  3:33 AM EST ----- Regarding: Follow-up [name],This patient did not show up for her scheduled clinic visit.Can we try to reach out to the husband and see if they would like to follow-up for discussion of the large polyp being resected that she has in her colon?Please keep me up-to-date.Thanks.GM

## 2019-02-06 NOTE — Telephone Encounter (Signed)
Tried to reach the pt and the line rings busy

## 2019-02-07 IMAGING — CR DG CHEST 2V
2 series · 2 of 2 positions shown · non-contrast
Comparison: 09/26/2017

CLINICAL DATA: Patient reports acute on chronic bilateral LE
swelling and SOB worsening over the past 3 days. Denies CP, cough or
fever. Hx NSTEMI, DM, COPD, CHF, cervical ca, cholecystectomy, open
heart surgery approx. 1 year ago. Current smoker.

EXAM:
CHEST - 2 VIEW

[chest pa]
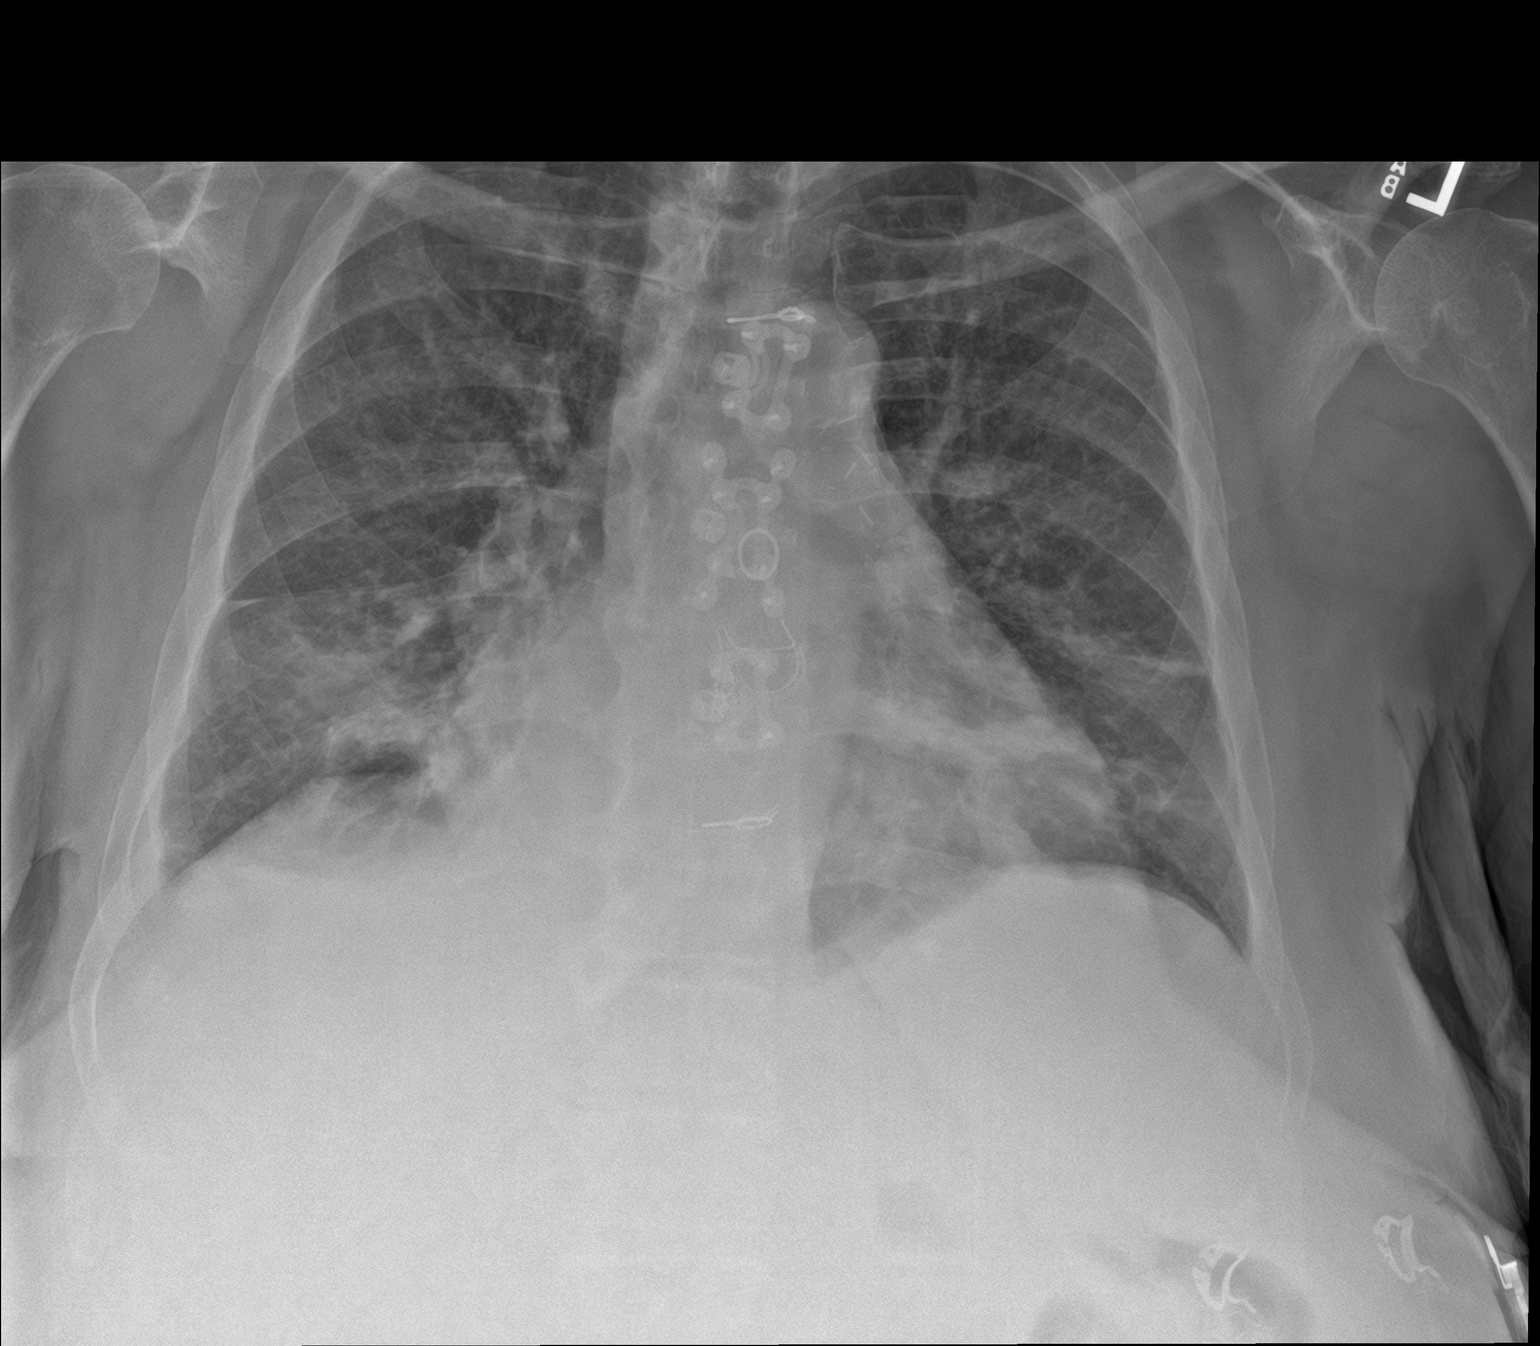

[chest lat]
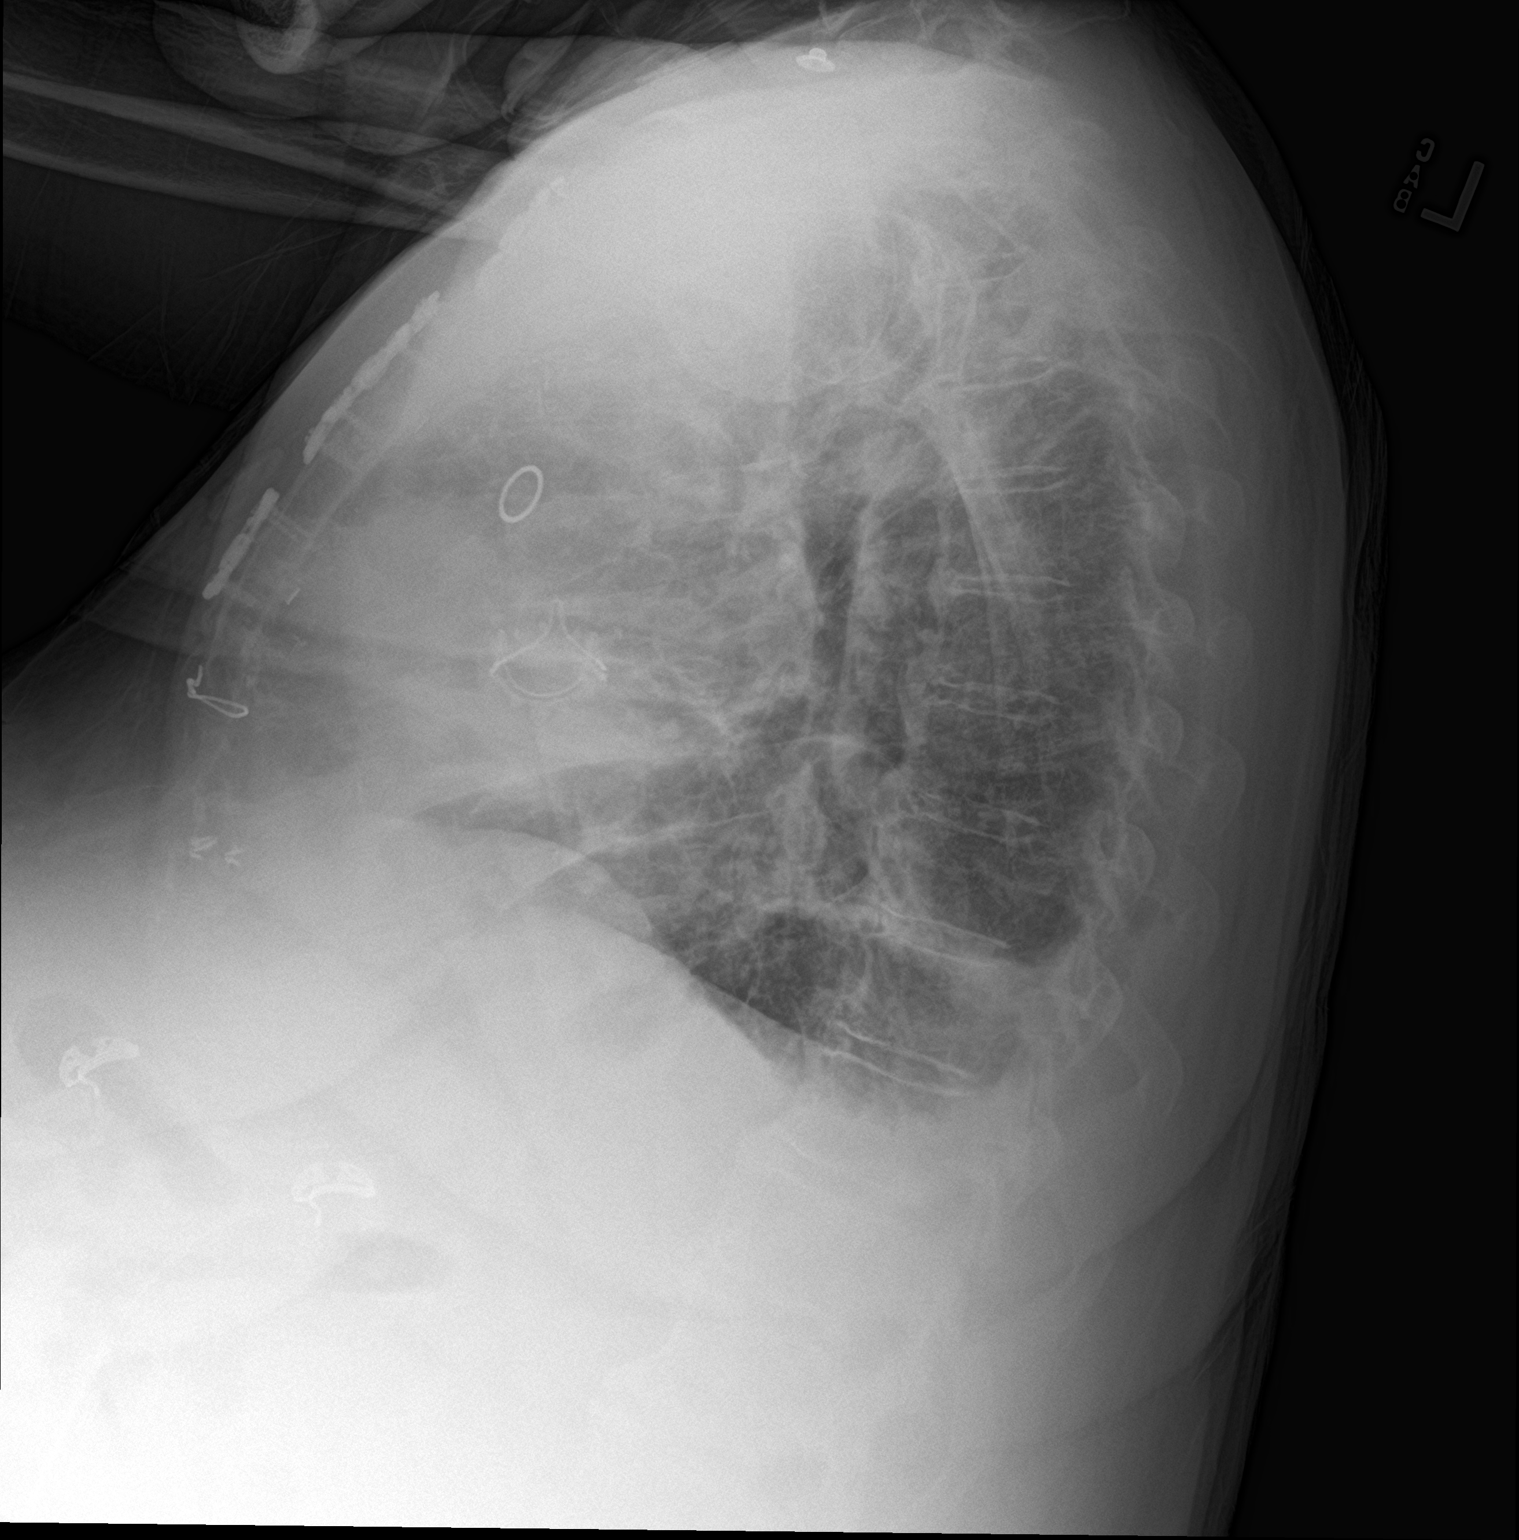

[2 of 2 positions shown; findings below may reference images not displayed]

FINDINGS: Patchy airspace opacities in the right middle lobe and both lower
lobes, new since previous. Bilateral perihilar interstitial
prominence, new since previous.

Heart size upper limits normal. Previous CABG, AVR, median
sternotomy.

New small bilateral pleural effusions.

Anterior vertebral endplate spurring at multiple levels in the mid
and lower thoracic spine.
IMPRESSION: New bilateral edema/infiltrates and small effusions.

## 2019-02-07 NOTE — Telephone Encounter (Signed)
[name]  Please try to reach out to her from our end as well regarding her polyp from message below  Thanks RV

## 2019-02-07 NOTE — Telephone Encounter (Signed)
FYI we have tried to contact the pt to get her on the schedule unsuccessfully.

## 2019-02-07 NOTE — Telephone Encounter (Signed)
Called and left a message for spouse on both numbers

## 2019-02-07 NOTE — Telephone Encounter (Signed)
Tried to call patient primary number and patient number could not be called at this time. Called patient spouse on home number and left a message for call back. Called and left a message for spouse mobile number.

## 2019-02-23 ENCOUNTER — Inpatient Hospital Stay
Admission: EM | Admit: 2019-02-23 | Discharge: 2019-02-27 | DRG: 690 | Disposition: A | Discharge status: Skilled Nursing Facility | Payer: Medicaid Other | Attending: Internal Medicine | Admitting: Internal Medicine

## 2019-02-23 ENCOUNTER — Emergency Department: Payer: Medicaid Other

## 2019-02-23 ENCOUNTER — Other Ambulatory Visit: Payer: Self-pay

## 2019-02-23 ENCOUNTER — Encounter: Payer: Self-pay | Admitting: *Deleted

## 2019-02-23 DIAGNOSIS — I130 Hypertensive heart and chronic kidney disease with heart failure and stage 1 through stage 4 chronic kidney disease, or unspecified chronic kidney disease: Secondary | ICD-10-CM | POA: Diagnosis present

## 2019-02-23 DIAGNOSIS — M79661 Pain in right lower leg: Secondary | ICD-10-CM | POA: Diagnosis not present

## 2019-02-23 DIAGNOSIS — Z91040 Latex allergy status: Secondary | ICD-10-CM

## 2019-02-23 DIAGNOSIS — N390 Urinary tract infection, site not specified: Principal | ICD-10-CM | POA: Diagnosis present

## 2019-02-23 DIAGNOSIS — K7460 Unspecified cirrhosis of liver: Secondary | ICD-10-CM | POA: Diagnosis present

## 2019-02-23 DIAGNOSIS — E669 Obesity, unspecified: Secondary | ICD-10-CM | POA: Diagnosis present

## 2019-02-23 DIAGNOSIS — F419 Anxiety disorder, unspecified: Secondary | ICD-10-CM | POA: Diagnosis present

## 2019-02-23 DIAGNOSIS — F329 Major depressive disorder, single episode, unspecified: Secondary | ICD-10-CM | POA: Diagnosis present

## 2019-02-23 DIAGNOSIS — I5022 Chronic systolic (congestive) heart failure: Secondary | ICD-10-CM | POA: Diagnosis present

## 2019-02-23 DIAGNOSIS — I425 Other restrictive cardiomyopathy: Secondary | ICD-10-CM | POA: Diagnosis present

## 2019-02-23 DIAGNOSIS — W19XXXA Unspecified fall, initial encounter: Secondary | ICD-10-CM | POA: Diagnosis present

## 2019-02-23 DIAGNOSIS — I10 Essential (primary) hypertension: Secondary | ICD-10-CM | POA: Diagnosis present

## 2019-02-23 DIAGNOSIS — N3090 Cystitis, unspecified without hematuria: Secondary | ICD-10-CM

## 2019-02-23 DIAGNOSIS — G4700 Insomnia, unspecified: Secondary | ICD-10-CM | POA: Diagnosis present

## 2019-02-23 DIAGNOSIS — I252 Old myocardial infarction: Secondary | ICD-10-CM

## 2019-02-23 DIAGNOSIS — I2510 Atherosclerotic heart disease of native coronary artery without angina pectoris: Secondary | ICD-10-CM | POA: Diagnosis present

## 2019-02-23 DIAGNOSIS — R627 Adult failure to thrive: Secondary | ICD-10-CM | POA: Diagnosis present

## 2019-02-23 DIAGNOSIS — M79671 Pain in right foot: Secondary | ICD-10-CM

## 2019-02-23 DIAGNOSIS — N1832 Chronic kidney disease, stage 3b: Secondary | ICD-10-CM | POA: Diagnosis present

## 2019-02-23 DIAGNOSIS — E1122 Type 2 diabetes mellitus with diabetic chronic kidney disease: Secondary | ICD-10-CM | POA: Diagnosis present

## 2019-02-23 DIAGNOSIS — E1142 Type 2 diabetes mellitus with diabetic polyneuropathy: Secondary | ICD-10-CM | POA: Diagnosis present

## 2019-02-23 DIAGNOSIS — Z833 Family history of diabetes mellitus: Secondary | ICD-10-CM

## 2019-02-23 DIAGNOSIS — Z89412 Acquired absence of left great toe: Secondary | ICD-10-CM

## 2019-02-23 DIAGNOSIS — Z6833 Body mass index (BMI) 33.0-33.9, adult: Secondary | ICD-10-CM

## 2019-02-23 DIAGNOSIS — Z20822 Contact with and (suspected) exposure to covid-19: Secondary | ICD-10-CM | POA: Diagnosis present

## 2019-02-23 DIAGNOSIS — Z9049 Acquired absence of other specified parts of digestive tract: Secondary | ICD-10-CM

## 2019-02-23 DIAGNOSIS — E785 Hyperlipidemia, unspecified: Secondary | ICD-10-CM | POA: Diagnosis present

## 2019-02-23 DIAGNOSIS — Z888 Allergy status to other drugs, medicaments and biological substances status: Secondary | ICD-10-CM

## 2019-02-23 DIAGNOSIS — R531 Weakness: Secondary | ICD-10-CM | POA: Diagnosis not present

## 2019-02-23 DIAGNOSIS — T7601XA Adult neglect or abandonment, suspected, initial encounter: Secondary | ICD-10-CM | POA: Diagnosis present

## 2019-02-23 DIAGNOSIS — G4730 Sleep apnea, unspecified: Secondary | ICD-10-CM | POA: Diagnosis present

## 2019-02-23 DIAGNOSIS — E860 Dehydration: Secondary | ICD-10-CM | POA: Diagnosis present

## 2019-02-23 DIAGNOSIS — F17210 Nicotine dependence, cigarettes, uncomplicated: Secondary | ICD-10-CM | POA: Diagnosis present

## 2019-02-23 DIAGNOSIS — D509 Iron deficiency anemia, unspecified: Secondary | ICD-10-CM | POA: Diagnosis present

## 2019-02-23 DIAGNOSIS — Z79899 Other long term (current) drug therapy: Secondary | ICD-10-CM

## 2019-02-23 DIAGNOSIS — Z8249 Family history of ischemic heart disease and other diseases of the circulatory system: Secondary | ICD-10-CM

## 2019-02-23 DIAGNOSIS — D638 Anemia in other chronic diseases classified elsewhere: Secondary | ICD-10-CM | POA: Diagnosis present

## 2019-02-23 DIAGNOSIS — J449 Chronic obstructive pulmonary disease, unspecified: Secondary | ICD-10-CM | POA: Diagnosis present

## 2019-02-23 DIAGNOSIS — Z8541 Personal history of malignant neoplasm of cervix uteri: Secondary | ICD-10-CM

## 2019-02-23 DIAGNOSIS — K219 Gastro-esophageal reflux disease without esophagitis: Secondary | ICD-10-CM | POA: Diagnosis present

## 2019-02-23 DIAGNOSIS — B182 Chronic viral hepatitis C: Secondary | ICD-10-CM | POA: Diagnosis present

## 2019-02-23 LAB — COMPREHENSIVE METABOLIC PANEL
ALT: 18 U/L (ref 0–44)
AST: 23 U/L (ref 15–41)
Albumin: 3 g/dL — ABNORMAL LOW (ref 3.5–5.0)
Alkaline Phosphatase: 156 U/L — ABNORMAL HIGH (ref 38–126)
Anion gap: 11 (ref 5–15)
BUN: 37 mg/dL — ABNORMAL HIGH (ref 8–23)
CO2: 21 mmol/L — ABNORMAL LOW (ref 22–32)
Calcium: 9.4 mg/dL (ref 8.9–10.3)
Chloride: 106 mmol/L (ref 98–111)
Creatinine, Ser: 1.62 mg/dL — ABNORMAL HIGH (ref 0.44–1.00)
GFR calc Af Amer: 38 mL/min — ABNORMAL LOW (ref >60)
GFR calc non Af Amer: 33 mL/min — ABNORMAL LOW (ref >60)
Glucose, Bld: 139 mg/dL — ABNORMAL HIGH (ref 70–99)
Potassium: 4.6 mmol/L (ref 3.5–5.1)
Sodium: 138 mmol/L (ref 135–145)
Total Bilirubin: 0.6 mg/dL (ref 0.3–1.2)
Total Protein: 9.3 g/dL — ABNORMAL HIGH (ref 6.5–8.1)

## 2019-02-23 LAB — CBC
HCT: 35.2 % — ABNORMAL LOW (ref 36.0–46.0)
Hemoglobin: 10.8 g/dL — ABNORMAL LOW (ref 12.0–15.0)
MCH: 25.4 pg — ABNORMAL LOW (ref 26.0–34.0)
MCHC: 30.7 g/dL (ref 30.0–36.0)
MCV: 82.8 fL (ref 80.0–100.0)
Platelets: 165 10*3/uL (ref 150–400)
RBC: 4.25 MIL/uL (ref 3.87–5.11)
RDW: 17.5 % — ABNORMAL HIGH (ref 11.5–15.5)
WBC: 7.7 10*3/uL (ref 4.0–10.5)
nRBC: 0 % (ref 0.0–0.2)

## 2019-02-23 LAB — URINALYSIS, COMPLETE (UACMP) WITH MICROSCOPIC
Bilirubin Urine: NEGATIVE
Glucose, UA: NEGATIVE mg/dL
Hgb urine dipstick: NEGATIVE
Ketones, ur: NEGATIVE mg/dL
Nitrite: NEGATIVE
Protein, ur: 100 mg/dL — AB
Specific Gravity, Urine: 1.015 (ref 1.005–1.030)
Squamous Epithelial / HPF: NONE SEEN (ref 0–5)
pH: 5 (ref 5.0–8.0)

## 2019-02-23 LAB — LACTIC ACID, PLASMA: Lactic Acid, Venous: 1.2 mmol/L (ref 0.5–1.9)

## 2019-02-23 LAB — GLUCOSE, CAPILLARY: Glucose-Capillary: 201 mg/dL — ABNORMAL HIGH (ref 70–99)

## 2019-02-23 LAB — RESPIRATORY PANEL BY RT PCR (FLU A&B, COVID)
Influenza A by PCR: NEGATIVE
Influenza B by PCR: NEGATIVE
SARS Coronavirus 2 by RT PCR: NEGATIVE

## 2019-02-23 LAB — TROPONIN I (HIGH SENSITIVITY): Troponin I (High Sensitivity): 11 ng/L (ref <18)

## 2019-02-23 LAB — CK: Total CK: 75 U/L (ref 38–234)

## 2019-02-23 MED ORDER — SODIUM CHLORIDE 0.9 % IV SOLN
1.0000 g | INTRAVENOUS | Status: DC
  Administered 2019-02-24 – 2019-02-26 (×3): 1 g via INTRAVENOUS
  Filled 2019-02-23: qty 10
  Filled 2019-02-23 (×3): qty 1

## 2019-02-23 MED ORDER — ONDANSETRON HCL 4 MG/2ML IJ SOLN: 4.0000 mg | Freq: Four times a day (QID) | INTRAMUSCULAR | Status: DC | PRN

## 2019-02-23 MED ORDER — TRAMADOL HCL 50 MG PO TABS
50.0000 mg | ORAL_TABLET | Freq: Once | ORAL | Status: AC
  Administered 2019-02-23: 50 mg via ORAL
  Filled 2019-02-23: qty 1

## 2019-02-23 MED ORDER — ACETAMINOPHEN 650 MG RE SUPP: 650.0000 mg | Freq: Four times a day (QID) | RECTAL | Status: DC | PRN

## 2019-02-23 MED ORDER — SODIUM CHLORIDE 0.9 % IV SOLN
1.0000 g | Freq: Once | INTRAVENOUS | Status: AC
  Administered 2019-02-23: 1 g via INTRAVENOUS
  Filled 2019-02-23: qty 10

## 2019-02-23 MED ORDER — ALBUTEROL SULFATE HFA 108 (90 BASE) MCG/ACT IN AERS
1.0000 | INHALATION_SPRAY | RESPIRATORY_TRACT | Status: DC | PRN
  Filled 2019-02-23: qty 6.7

## 2019-02-23 MED ORDER — FLUTICASONE PROPIONATE 50 MCG/ACT NA SUSP
2.0000 | Freq: Every day | NASAL | Status: DC
  Administered 2019-02-24 – 2019-02-27 (×4): 2 via NASAL
  Filled 2019-02-23: qty 16

## 2019-02-23 MED ORDER — SODIUM CHLORIDE 0.9 % IV SOLN: INTRAVENOUS | Status: AC

## 2019-02-23 MED ORDER — ONDANSETRON HCL 4 MG PO TABS: 4.0000 mg | ORAL_TABLET | Freq: Four times a day (QID) | ORAL | Status: DC | PRN

## 2019-02-23 MED ORDER — PRO-STAT SUGAR FREE PO LIQD
30.0000 mL | Freq: Two times a day (BID) | ORAL | Status: DC
  Administered 2019-02-24 – 2019-02-27 (×7): 30 mL via ORAL

## 2019-02-23 MED ORDER — FOLIC ACID 1 MG PO TABS
1.0000 mg | ORAL_TABLET | Freq: Every day | ORAL | Status: DC
  Administered 2019-02-24 – 2019-02-27 (×4): 1 mg via ORAL
  Filled 2019-02-23 (×4): qty 1

## 2019-02-23 MED ORDER — ACETAMINOPHEN 325 MG PO TABS
650.0000 mg | ORAL_TABLET | Freq: Four times a day (QID) | ORAL | Status: DC | PRN
  Administered 2019-02-25: 650 mg via ORAL
  Filled 2019-02-23: qty 2

## 2019-02-23 MED ORDER — PREGABALIN 75 MG PO CAPS
150.0000 mg | ORAL_CAPSULE | Freq: Two times a day (BID) | ORAL | Status: DC
  Administered 2019-02-24 – 2019-02-27 (×7): 150 mg via ORAL
  Filled 2019-02-23 (×8): qty 2

## 2019-02-23 MED ORDER — MIRTAZAPINE 15 MG PO TBDP
30.0000 mg | ORAL_TABLET | Freq: Every day | ORAL | Status: DC
  Administered 2019-02-24 – 2019-02-26 (×3): 30 mg via ORAL
  Filled 2019-02-23 (×5): qty 2

## 2019-02-23 MED ORDER — ATORVASTATIN CALCIUM 20 MG PO TABS
40.0000 mg | ORAL_TABLET | Freq: Every day | ORAL | Status: DC
  Administered 2019-02-24 – 2019-02-26 (×3): 40 mg via ORAL
  Filled 2019-02-23 (×3): qty 2

## 2019-02-23 MED ORDER — CARVEDILOL 6.25 MG PO TABS
3.1250 mg | ORAL_TABLET | Freq: Two times a day (BID) | ORAL | Status: DC
  Administered 2019-02-24 – 2019-02-27 (×7): 3.125 mg via ORAL
  Filled 2019-02-23 (×7): qty 1

## 2019-02-23 MED ORDER — INSULIN ASPART 100 UNIT/ML ~~LOC~~ SOLN
0.0000 [IU] | Freq: Three times a day (TID) | SUBCUTANEOUS | Status: DC
  Administered 2019-02-24 (×2): 1 [IU] via SUBCUTANEOUS
  Administered 2019-02-25 – 2019-02-26 (×2): 2 [IU] via SUBCUTANEOUS
  Administered 2019-02-26 – 2019-02-27 (×2): 1 [IU] via SUBCUTANEOUS
  Administered 2019-02-27: 2 [IU] via SUBCUTANEOUS
  Filled 2019-02-23 (×7): qty 1

## 2019-02-23 MED ORDER — PANTOPRAZOLE SODIUM 40 MG PO TBEC
40.0000 mg | DELAYED_RELEASE_TABLET | Freq: Two times a day (BID) | ORAL | Status: DC
  Administered 2019-02-24 – 2019-02-27 (×8): 40 mg via ORAL
  Filled 2019-02-23 (×8): qty 1

## 2019-02-23 NOTE — ED Notes (Signed)
Attempted to call patient's spouse per request; no answer at number provided. Will re-attempt later.

## 2019-02-23 NOTE — ED Notes (Signed)
Called floor to let them know patient about to be transported to room.
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
## 2019-02-23 NOTE — ED Notes (Signed)
Report given to Jenna RN

## 2019-02-23 NOTE — ED Triage Notes (Addendum)
Per EMS report, patient reports a fall when she stood up with her walker. Patient c/o back pain. Patient has chronic back pain. Patient is disheveled and is incontinent of stool. Stool was found in her pants.
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
## 2019-02-23 NOTE — ED Notes (Signed)
When undressing patient, patient's sock was stuck to left great toe's open wound. PA is aware.

## 2019-02-23 NOTE — ED Notes (Signed)
Admitting MD at bedside.

## 2019-02-23 NOTE — ED Notes (Signed)
Per EMS- Pt was found by the fire department on her couch covered in stool. Pt told them that she had been like that for four days, this RN confirmed with pt and pt states that she has someone come to check on her every 3-4 days, the last time someone checked on her was 4 days ago. The patient states that she has been unable to get up to use the bathroom since then.   Per EMS, the fire department removed a towel from the patient that was saturated with stool and urine. Per pt, she lives with her husband who is wheel chair bound.   This RN cleaned pt and found her in a saturated diaper with stool and urine. The patients lower back and bottom are red, the skin is peeling on the right upper thigh and buttock. The patient states she is very itchy and there are scratch marks present.

## 2019-02-23 NOTE — ED Notes (Signed)
Pharmacy tech at bedside 

## 2019-02-23 NOTE — ED Notes (Signed)
Report made to Lasha Hickman at [facility].

## 2019-02-23 NOTE — ED Notes (Signed)
[place] is in place.

## 2019-02-24 DIAGNOSIS — F419 Anxiety disorder, unspecified: Secondary | ICD-10-CM | POA: Diagnosis present

## 2019-02-24 DIAGNOSIS — N3000 Acute cystitis without hematuria: Secondary | ICD-10-CM

## 2019-02-24 DIAGNOSIS — E1142 Type 2 diabetes mellitus with diabetic polyneuropathy: Secondary | ICD-10-CM | POA: Diagnosis present

## 2019-02-24 DIAGNOSIS — Z20822 Contact with and (suspected) exposure to covid-19: Secondary | ICD-10-CM | POA: Diagnosis present

## 2019-02-24 DIAGNOSIS — G4700 Insomnia, unspecified: Secondary | ICD-10-CM | POA: Diagnosis present

## 2019-02-24 DIAGNOSIS — F17210 Nicotine dependence, cigarettes, uncomplicated: Secondary | ICD-10-CM | POA: Diagnosis present

## 2019-02-24 DIAGNOSIS — G4730 Sleep apnea, unspecified: Secondary | ICD-10-CM | POA: Diagnosis present

## 2019-02-24 DIAGNOSIS — I5022 Chronic systolic (congestive) heart failure: Secondary | ICD-10-CM | POA: Diagnosis present

## 2019-02-24 DIAGNOSIS — E1122 Type 2 diabetes mellitus with diabetic chronic kidney disease: Secondary | ICD-10-CM | POA: Diagnosis present

## 2019-02-24 DIAGNOSIS — W19XXXA Unspecified fall, initial encounter: Secondary | ICD-10-CM | POA: Diagnosis present

## 2019-02-24 DIAGNOSIS — K7460 Unspecified cirrhosis of liver: Secondary | ICD-10-CM | POA: Diagnosis present

## 2019-02-24 DIAGNOSIS — W19XXXD Unspecified fall, subsequent encounter: Secondary | ICD-10-CM

## 2019-02-24 DIAGNOSIS — T7601XA Adult neglect or abandonment, suspected, initial encounter: Secondary | ICD-10-CM | POA: Diagnosis present

## 2019-02-24 DIAGNOSIS — E860 Dehydration: Secondary | ICD-10-CM | POA: Diagnosis present

## 2019-02-24 DIAGNOSIS — F329 Major depressive disorder, single episode, unspecified: Secondary | ICD-10-CM | POA: Diagnosis present

## 2019-02-24 DIAGNOSIS — R627 Adult failure to thrive: Secondary | ICD-10-CM | POA: Diagnosis present

## 2019-02-24 DIAGNOSIS — K219 Gastro-esophageal reflux disease without esophagitis: Secondary | ICD-10-CM | POA: Diagnosis present

## 2019-02-24 DIAGNOSIS — J449 Chronic obstructive pulmonary disease, unspecified: Secondary | ICD-10-CM | POA: Diagnosis present

## 2019-02-24 DIAGNOSIS — I130 Hypertensive heart and chronic kidney disease with heart failure and stage 1 through stage 4 chronic kidney disease, or unspecified chronic kidney disease: Secondary | ICD-10-CM | POA: Diagnosis present

## 2019-02-24 DIAGNOSIS — E669 Obesity, unspecified: Secondary | ICD-10-CM | POA: Diagnosis present

## 2019-02-24 DIAGNOSIS — I252 Old myocardial infarction: Secondary | ICD-10-CM | POA: Diagnosis not present

## 2019-02-24 DIAGNOSIS — R531 Weakness: Secondary | ICD-10-CM | POA: Diagnosis not present

## 2019-02-24 DIAGNOSIS — E785 Hyperlipidemia, unspecified: Secondary | ICD-10-CM | POA: Diagnosis present

## 2019-02-24 DIAGNOSIS — N390 Urinary tract infection, site not specified: Secondary | ICD-10-CM | POA: Diagnosis present

## 2019-02-24 DIAGNOSIS — D638 Anemia in other chronic diseases classified elsewhere: Secondary | ICD-10-CM | POA: Diagnosis present

## 2019-02-24 DIAGNOSIS — I425 Other restrictive cardiomyopathy: Secondary | ICD-10-CM | POA: Diagnosis present

## 2019-02-24 DIAGNOSIS — M79671 Pain in right foot: Secondary | ICD-10-CM | POA: Diagnosis not present

## 2019-02-24 DIAGNOSIS — N1832 Chronic kidney disease, stage 3b: Secondary | ICD-10-CM | POA: Diagnosis present

## 2019-02-24 DIAGNOSIS — D509 Iron deficiency anemia, unspecified: Secondary | ICD-10-CM | POA: Diagnosis present

## 2019-02-24 LAB — CBC
HCT: 30.5 % — ABNORMAL LOW (ref 36.0–46.0)
Hemoglobin: 9.1 g/dL — ABNORMAL LOW (ref 12.0–15.0)
MCH: 24.9 pg — ABNORMAL LOW (ref 26.0–34.0)
MCHC: 29.8 g/dL — ABNORMAL LOW (ref 30.0–36.0)
MCV: 83.6 fL (ref 80.0–100.0)
Platelets: 146 10*3/uL — ABNORMAL LOW (ref 150–400)
RBC: 3.65 MIL/uL — ABNORMAL LOW (ref 3.87–5.11)
RDW: 17.3 % — ABNORMAL HIGH (ref 11.5–15.5)
WBC: 5.6 10*3/uL (ref 4.0–10.5)
nRBC: 0 % (ref 0.0–0.2)

## 2019-02-24 LAB — BASIC METABOLIC PANEL
Anion gap: 8 (ref 5–15)
BUN: 37 mg/dL — ABNORMAL HIGH (ref 8–23)
CO2: 21 mmol/L — ABNORMAL LOW (ref 22–32)
Calcium: 8.8 mg/dL — ABNORMAL LOW (ref 8.9–10.3)
Chloride: 111 mmol/L (ref 98–111)
Creatinine, Ser: 1.47 mg/dL — ABNORMAL HIGH (ref 0.44–1.00)
GFR calc Af Amer: 43 mL/min — ABNORMAL LOW (ref >60)
GFR calc non Af Amer: 37 mL/min — ABNORMAL LOW (ref >60)
Glucose, Bld: 123 mg/dL — ABNORMAL HIGH (ref 70–99)
Potassium: 4.6 mmol/L (ref 3.5–5.1)
Sodium: 140 mmol/L (ref 135–145)

## 2019-02-24 LAB — GLUCOSE, CAPILLARY
Glucose-Capillary: 113 mg/dL — ABNORMAL HIGH (ref 70–99)
Glucose-Capillary: 140 mg/dL — ABNORMAL HIGH (ref 70–99)
Glucose-Capillary: 126 mg/dL — ABNORMAL HIGH (ref 70–99)
Glucose-Capillary: 142 mg/dL — ABNORMAL HIGH (ref 70–99)

## 2019-02-24 MED ORDER — SODIUM CHLORIDE 0.9 % IV SOLN
INTRAVENOUS | Status: DC | PRN
  Administered 2019-02-24: 50 mL via INTRAVENOUS

## 2019-02-24 NOTE — Evaluation (Signed)
Physical Therapy Evaluation Patient Details Name: [name] MRN: [id number] DOB: 1955-06-08 Today's Date: 02/24/2019   History of Present Illness  [name] is a 64 y.o. female presents to the ER via EMS with c/o neck and back pain s/p a fall when her legs gave out upon attempt to stand. Per ED documentation, patient was found by first responders in her home sitting on the couch covered in stool and urine.  She reportedly told them that she had been like that for 4 days.  She lives with her husband who is her caregiver and who is wheelchair-bound. Pt with PMH of DMII, CHF, CKD, COPD, N-STEMI. Patient with recent admissions on 11/20/2018 and 11/29 for GI bleed.  Clinical Impression  Pt is a pleasant 64 year old who was admitted for above diagnoses. Pt A&Ox4, however at times providing contradictory information regarding PLOF, stating she hasn't walked in 2 years and has been in a nursing home the past 2 years prior to returning home 2 weeks ago; PT documentation from recent hospital (12/2018) stay stating she ambulated short room distances with RW.  Pt performs bed mobility with CGA for safety, transfers min/mod A for liftoff and balance and RW, demonstrating posterior lean and requiring x2 attempts to perform successfully; deferred ambulation today for safety. O2 saturation decreasing to 86% following standing with pt reporting dizziness, though resumes to >92% with brief standing rest break and instruction in pursed lip breathing. Pt also with noted decreased sensation with light touch testing to distal lower extremities. Pt demonstrates deficits with strength, sensation, and balance limiting ability to perform functional mobility. Pt will continue to benefit from skilled PT intervention to address above deficits. Rec DC to SNF as patient has very limited support at home (lives with husband who is wheelchair-bound and unable to assist with care) and demonstrates deficits in balance and  mobility increasing fall risk.      Follow Up Recommendations SNF    Equipment Recommendations  Rolling walker with 5" wheels(per pt, has BSC and shower chair)    Recommendations for Other Services OT consult     Precautions / Restrictions Precautions Precautions: Fall;Other (comment)(decreased light touch sensation distal lower extremities) Restrictions Weight Bearing Restrictions: No      Mobility  Bed Mobility Overal bed mobility: Needs Assistance Bed Mobility: Supine to Sit     Supine to sit: Min guard     General bed mobility comments: Able to perform supine>sidelying>sitting requiring extra time and cuing for sequencing, and CGA for safety  Transfers Overall transfer level: Needs assistance Equipment used: Rolling walker (2 wheeled) Transfers: Sit to/from [facility] Sit to Stand: Mod assist;+2 safety/equipment(CNA present and assists with IV pole management and guarding patient for safety) Stand pivot transfers: Min assist       General transfer comment: requiring x2 attempts to successfully perform STS and mod A for liftoff, and cuing for forward propulsion; mild posterior leaning initially upon standing requiring min A for balance. Reporting mild dizziness upon standing; O2 decreasing to 86, though improves with brief standing rest break and pursed lip breathing to >92%. Once standing, able to perform stand pivot transfer from bedside > chair using RW with min A for balance.  Ambulation/Gait   Gait Distance (Feet): 0 Feet         General Gait Details: Deferred gait today for safety concerns of balance and pt reporting dizziness  Stairs            Wheelchair  Mobility    Modified Rankin (Stroke Patients Only)       Balance Overall balance assessment: Needs assistance Sitting-balance support: No upper extremity supported;Feet unsupported Sitting balance-Leahy Scale: Good Sitting balance - Comments: Able to sit independently  edge of bed without UE/LE support, and scoot to edge of bed   Standing balance support: Bilateral upper extremity supported;During functional activity Standing balance-Leahy Scale: Fair Standing balance comment: Requiring assist during STS to maintain balance once standing with posterior lean (with support of RW)                             Pertinent Vitals/Pain Pain Assessment: No/denies pain(No pain at start of session; however reports pain in legs with bed mobility)    Home Living Family/patient expects to be discharged to:: Skilled nursing facility Living Arrangements: Spouse/significant other               Additional Comments: lives with husband who is wheelchair-bound and per pt, no longer able to assist her with ADLs    Prior Function Level of Independence: Needs assistance   Gait / Transfers Assistance Needed: Per documentation from recent hospitalizations, pt was ambulating short bouts with RW; however, pt states she hasn't walked in 2 years and was in nursing home/assisted living for 2 years up until 2 weeks ago. Pt also states husband was previously assisting her with ADLs but he is no longer able to and needs help himself.  ADL's / Homemaking Assistance Needed: Pt states husband was assisting her up until recently, and now he is no longer able to care for her or himself appropriately; he is also wheelchair bound.  Comments: Pt is pleasant, though slightly drowsy; able to answer questions though subjective history at times is contradictory to documentation from recent hospitalizations and earlier statements this session     Hand Dominance        Extremity/Trunk Assessment   Upper Extremity Assessment Upper Extremity Assessment: Overall WFL for tasks assessed;Generalized weakness    Lower Extremity Assessment Lower Extremity Assessment: Generalized weakness;RLE deficits/detail;LLE deficits/detail RLE Sensation: decreased light touch(to distal lower  extremities.) LLE Deficits / Details: L toe amputation - bandage is present, but pt states it was performed ~1 year ago. Per documentation, open wound was visualized by nursing. LLE Sensation: decreased light touch       Communication   Communication: No difficulties;Other (comment)(Pt is pleasant, though slightly drowsy; able to answer questions though subjective history at times is contradictory to documentation from recent hospitalizations)  Cognition Arousal/Alertness: Awake/alert(though slightly drowsy) Behavior During Therapy: [facility] for tasks assessed/performed Overall Cognitive Status: Within Functional Limits for tasks assessed                                 General Comments: A&O x4; however provides information regarding prior level of function which is contradictory at times to her earlier statements as well as documentation from recent hospitalizations      General Comments      Exercises Total Joint Exercises Ankle Circles/Pumps: AROM;20 reps;Both;Supine Gluteal Sets: Strengthening;5 reps(hooklying)   Assessment/Plan    PT Assessment Patient needs continued PT services  PT Problem List Decreased strength;Decreased mobility;Decreased safety awareness;Decreased range of motion;Decreased balance;Impaired sensation;Decreased activity tolerance       PT Treatment Interventions DME instruction;Therapeutic exercise;Gait training;Balance training;Stair training;Neuromuscular re-education;Functional mobility training;Therapeutic activities;Patient/family education    PT Goals (Current  goals can be found in the Care Plan section)  Acute Rehab PT Goals Patient Stated Goal: to be able to walk again PT Goal Formulation: With patient Time For Goal Achievement: 03/10/19 Potential to Achieve Goals: Good    Frequency Min 2X/week   Barriers to discharge        Co-evaluation               AM-PAC PT "6 Clicks" Mobility  Outcome Measure Help needed turning  from your back to your side while in a flat bed without using bedrails?: A Little Help needed moving from lying on your back to sitting on the side of a flat bed without using bedrails?: A Little Help needed moving to and from a bed to a chair (including a wheelchair)?: A Little Help needed standing up from a chair using your arms (e.g., wheelchair or bedside chair)?: A Little Help needed to walk in hospital room?: A Lot Help needed climbing 3-5 steps with a railing? : A Lot 6 Click Score: 16    End of Session Equipment Utilized During Treatment: Gait belt Activity Tolerance: No increased pain;Patient limited by fatigue Patient left: in chair;with chair alarm set;with nursing/sitter in room;with call bell/phone within reach Nurse Communication: Mobility status PT Visit Diagnosis: Unsteadiness on feet (R26.81);Other abnormalities of gait and mobility (R26.89);Muscle weakness (generalized) (M62.81);History of falling (Z91.81)    Time: 0950-1030 PT Time Calculation (min) (ACUTE ONLY): 40 min   Charges:   PT Evaluation $PT Eval Moderate Complexity: 1 Mod PT Treatments $Therapeutic Exercise: 8-22 mins $Therapeutic Activity: 8-22 mins        Petra Kuba, PT, DPT 02/24/19, 11:03 AM

## 2019-02-24 NOTE — Progress Notes (Signed)
PROGRESS NOTE    [name]  SWH:[id number] DOB: Dec 03, 1955 DOA: 02/23/2019 PCP: [facility]    Brief Narrative:  [name] is a 64 y.o. female with medical history significant for chronic systolic CHF/restrictive cardiomyopathy, CAD, COPD, chronic hepatitis C with cirrhosis, CKD stage III, diet-controlled type 2 diabetes, hypertension, hyperlipidemia, anemia of chronic disease, and history of GI bleeding in setting of AVMs and colonic angiectasias who presents to the ED from home via EMS for evaluation of generalized weakness and recent fall.  Patient states she was standing up from her couch earlier today (02/23/2019) when her legs gave out underneath her and she fell to the ground.  She denied any significant injury or loss of consciousness.  She has not had any chest pain, palpitations, dyspnea, cough, nausea, vomiting, abdominal pain, diarrhea, or dysuria.  She says she normally uses a walker to ambulate but was unable to get to her walker in time.  She has been feeling generally weak and dehydrated.  She called EMS for further assistance.  Per ED documentation, patient was found by first responders in her home sitting on the couch covered in stool and urine.  She reportedly told them that she had been like that for 4 days.  She lives with her husband who is wheelchair-bound.  Patient also with 2 recent admissions on 11/20/2018-12/07/2018 and 12/16/2018-01/02/2019 for GI bleeding both of which required multiple units of PRBC transfusions.  She has undergone multiple endoscopic procedures and found to have AVMs and colonic angiectasias  2/7: Patient seen and examined.  Symptomatically improved since admission.  Labs stable over the interval.  Kidney function improving.  Hemoglobin stable.  No fevers identifiable.  Seen by physical therapy.  Recommending skilled nursing facility placement.   Assessment & Plan:   Principal Problem:   UTI (urinary tract  infection) Active Problems:   Chronic systolic heart failure (HCC)   Diabetes ([place])   Weakness   COPD (chronic obstructive pulmonary disease) ([place])   Hyperlipidemia   Hypertension   Fall  [name] is a 64 y.o. female with medical history significant for chronic systolic CHF/restrictive cardiomyopathy, CAD, COPD, chronic hepatitis C with cirrhosis, CKD stage III, diet-controlled type 2 diabetes, hypertension, hyperlipidemia, anemia of chronic disease, and history of GI bleeding in setting of AVMs and colonic angiectasias who is admitted with generalized weakness and inability to care for self.  Generalized weakness/fall/inability to care for self: Per ED documentation, patient reportedly found at home by EMS sitting on couch covered in urine and stool.  She had reported that she had been sitting there for 4 days.  She says she has been generally weak unable to ambulate even with the use of her walker.  She has not had any significant injury from her fall. Plan: -PT/OT eval: recommending SNF.  TOC consult placed -Fall precautions -Patient is unsafe to return to home at this time as she is unable to care for herself and does not appear to have enough support at home at this time -No further IVF at this time given history of CHF   UTI: -Continue empiric ceftriaxone, plan for 3 day total course -Follow urine culture  History of recent GI bleeding/Anemia of iron deficiency and chronic blood loss: Recently admitted for GI bleeding and found to have AVMs and colonic angiectasias.  Denies any further bleeding Hemoglobin 10.8 on admission, dropped to 9.1.  Suspect dilutional effect -Continue PPI -Continue to monitor, hold pharmacologic VTE prophylaxis  CKD stage III: Chronic and stable.  Chronic hepatitis C with liver cirrhosis: Compensated without evidence of ascites or hepatic encephalopathy.   Appears overall volume depleted as above.   Continuing gentle IV fluid  overnight.   Holding home torsemide, spironolactone.   Lactulose also on hold, restart if needed.  Chronic systolic CHF/restrictive cardiomyopathy/CAD: Overall appears compensated  No further fluids at this time  Holding torsemide and Lasix.   Continue carvedilol and atorvastatin.   Monitor strict I/O's and daily weights.  Diet controlled type 2 diabetes: -Continue sensitive SSI while in hospital  Hypertension: -Continue Coreg,  holding torsemide and spironolactone as above  COPD: Currently stable.   Continue as needed albuterol.  Hyperlipidemia: Continue atorvastatin.   DVT prophylaxis: SCDs Code Status: Full Family Communication: None today Disposition Plan: Skilled nursing facility.  Anticipate dispo within 24 to 48 hours  Consultants:   None  Procedures:   None  Antimicrobials:   Ceftriaxone   Subjective: Patient seen and examined Symptomatic improvement of her interval No specific complaints today  Objective: Vitals:   02/24/19 0537 02/24/19 0908 02/24/19 1037 02/24/19 1327  BP: 128/73 127/65  (!) 118/59  Pulse: 97 88  75  Resp: 18   16  Temp: 98.4 F (36.9 C)   97.7 F (36.5 C)  TempSrc: Oral   Oral  SpO2: 97%  96% 94%  Weight:      Height:        Intake/Output Summary (Last 24 hours) at 02/24/2019 1332 Last data filed at 02/24/2019 0900 Gross per 24 hour  Intake 619.92 ml  Output --  Net 619.92 ml   Filed Weights   02/23/19 1745  Weight: 86.2 kg    Examination:  General exam: Appears calm and comfortable  Respiratory system: Clear to auscultation. Respiratory effort normal. Cardiovascular system: S1 & S2 heard, RRR. No JVD, murmurs, rubs, gallops or clicks. No pedal edema. Gastrointestinal system: Abdomen is nondistended, soft and nontender. No organomegaly or masses felt. Normal bowel sounds heard. Central nervous system: Alert and oriented. No focal neurological deficits. Extremities: Symmetric 5 x 5 power. Skin: No rashes,  lesions or ulcers Psychiatry: Judgement and insight appear normal. Mood & affect appropriate.     Data Reviewed: I have personally reviewed following labs and imaging studies  CBC: Recent Labs  Lab 02/23/19 1828 02/24/19 0611  WBC 7.7 5.6  HGB 10.8* 9.1*  HCT 35.2* 30.5*  MCV 82.8 83.6  PLT 165 160*   Basic Metabolic Panel: Recent Labs  Lab 02/23/19 1828 02/24/19 0611  NA 138 140  K 4.6 4.6  CL 106 111  CO2 21* 21*  GLUCOSE 139* 123*  BUN 37* 37*  CREATININE 1.62* 1.47*  CALCIUM 9.4 8.8*   GFR: Estimated Creatinine Clearance: 41.9 mL/min (A) (by C-G formula based on SCr of 1.47 mg/dL (H)). Liver Function Tests: Recent Labs  Lab 02/23/19 1828  AST 23  ALT 18  ALKPHOS 156*  BILITOT 0.6  PROT 9.3*  ALBUMIN 3.0*   No results for input(s): LIPASE, AMYLASE in the last 168 hours. No results for input(s): AMMONIA in the last 168 hours. Coagulation Profile: No results for input(s): INR, PROTIME in the last 168 hours. Cardiac Enzymes: Recent Labs  Lab 02/23/19 1828  CKTOTAL 75   BNP (last 3 results) No results for input(s): PROBNP in the last 8760 hours. HbA1C: No results for input(s): HGBA1C in the last 72 hours. CBG: Recent Labs  Lab 02/23/19 2321 02/24/19 0801 02/24/19 1149  GLUCAP 201* 113* 140*   Lipid Profile: No results for input(s): CHOL, HDL, LDLCALC, TRIG, CHOLHDL, LDLDIRECT in the last 72 hours. Thyroid Function Tests: No results for input(s): TSH, T4TOTAL, FREET4, T3FREE, THYROIDAB in the last 72 hours. Anemia Panel: No results for input(s): VITAMINB12, FOLATE, FERRITIN, TIBC, IRON, RETICCTPCT in the last 72 hours. Sepsis Labs: Recent Labs  Lab 02/23/19 1828  LATICACIDVEN 1.2    Recent Results (from the past 240 hour(s))  Culture, blood (routine x 2)     Status: None (Preliminary result)   Collection Time: 02/23/19  6:28 PM   Specimen: BLOOD  Result Value Ref Range Status   Specimen Description BLOOD LEFT ANTECUBITAL  Final    Special Requests   Final    BOTTLES DRAWN AEROBIC AND ANAEROBIC Blood Culture adequate volume   Culture   Final    NO GROWTH < 12 HOURS Performed at [facility], [address], [place], [place] [address]    Report Status PENDING  Incomplete  Respiratory Panel by RT PCR (Flu A&B, Covid) - Nasopharyngeal Swab     Status: None   Collection Time: 02/23/19  8:19 PM   Specimen: Nasopharyngeal Swab  Result Value Ref Range Status   SARS Coronavirus 2 by RT PCR NEGATIVE NEGATIVE Final    Comment: (NOTE) SARS-CoV-2 target nucleic acids are NOT DETECTED. The SARS-CoV-2 RNA is generally detectable in upper respiratoy specimens during the acute phase of infection. The lowest concentration of SARS-CoV-2 viral copies this assay can detect is 131 copies/mL. A negative result does not preclude SARS-Cov-2 infection and should not be used as the sole basis for treatment or other patient management decisions. A negative result may occur with  improper specimen collection/handling, submission of specimen other than nasopharyngeal swab, presence of viral mutation(s) within the areas targeted by this assay, and inadequate number of viral copies (<131 copies/mL). A negative result must be combined with clinical observations, patient history, and epidemiological information. The expected result is Negative. Fact Sheet for Patients:  PinkCheek.be Fact Sheet for Healthcare Providers:  GravelBags.it This test is not yet ap proved or cleared by the [place] FDA and  has been authorized for detection and/or diagnosis of SARS-CoV-2 by FDA under an Emergency Use Authorization (EUA). This EUA will remain  in effect (meaning this test can be used) for the duration of the COVID-19 declaration under Section 564(b)(1) of the Act, 21 U.S.C. section 360bbb-3(b)(1), unless the authorization is terminated or revoked sooner.    Influenza A by PCR  NEGATIVE NEGATIVE Final   Influenza B by PCR NEGATIVE NEGATIVE Final    Comment: (NOTE) The Xpert Xpress SARS-CoV-2/FLU/RSV assay is intended as an aid in  the diagnosis of influenza from Nasopharyngeal swab specimens and  should not be used as a sole basis for treatment. Nasal washings and  aspirates are unacceptable for Xpert Xpress SARS-CoV-2/FLU/RSV  testing. Fact Sheet for Patients: PinkCheek.be Fact Sheet for Healthcare Providers: GravelBags.it This test is not yet approved or cleared by the [place] FDA and  has been authorized for detection and/or diagnosis of SARS-CoV-2 by  FDA under an Emergency Use Authorization (EUA). This EUA will remain  in effect (meaning this test can be used) for the duration of the  Covid-19 declaration under Section 564(b)(1) of the Act, 21  U.S.C. section 360bbb-3(b)(1), unless the authorization is  terminated or revoked. Performed at [facility], [address], [place], [place] [address]  Radiology Studies: DG Cervical Spine 2-3 Views  Result Date: 02/23/2019 CLINICAL DATA:  Pain after fall EXAM: CERVICAL SPINE - 2-3 VIEW COMPARISON:  08/25/2008. Swimmer's view is on the thoracic spine study performed today. FINDINGS: Degenerative disc and facet disease diffusely throughout the cervical spine. No fracture is visualized. There is prominent soft tissue in the prevertebral region. IMPRESSION: No visible fracture or subluxation. Prevertebral soft tissue swelling noted. This could be further evaluated with cervical spine CT. Electronically Signed   By: Rolm Baptise M.D.   On: 02/23/2019 20:19   DG Thoracic Spine 2 View  Result Date: 02/23/2019 CLINICAL DATA:  Back pain after fall EXAM: THORACIC SPINE 2 VIEWS COMPARISON:  None. FINDINGS: Degenerative spurring throughout the thoracic spine. Normal alignment. No fracture or focal bone lesion. IMPRESSION: No acute bony  abnormality. Electronically Signed   By: Rolm Baptise M.D.   On: 02/23/2019 20:18   DG Chest Port 1 View  Result Date: 02/23/2019 CLINICAL DATA:  Fall EXAM: PORTABLE CHEST 1 VIEW COMPARISON:  11/20/2018 FINDINGS: Prior median sternotomy. Cardiomegaly. No confluent opacities or effusions. No edema. No acute bony abnormality. IMPRESSION: Cardiomegaly.  No active disease. Electronically Signed   By: Rolm Baptise M.D.   On: 02/23/2019 19:12        Scheduled Meds: . atorvastatin  40 mg Oral q1800  . carvedilol  3.125 mg Oral BID WC  . feeding supplement (PRO-STAT SUGAR FREE 64)  30 mL Oral BID  . fluticasone  2 spray Each Nare Daily  . folic acid  1 mg Oral Daily  . insulin aspart  0-9 Units Subcutaneous TID WC  . mirtazapine  30 mg Oral QHS  . pantoprazole  40 mg Oral BID  . pregabalin  150 mg Oral BID   Continuous Infusions: . cefTRIAXone (ROCEPHIN)  IV       LOS: 0 days    Time spent: 35 minutes    Sidney Ace, MD [facility] Pager [phone]  If 7PM-7AM, please contact night-coverage 02/24/2019, 1:32 PM

## 2019-02-24 NOTE — Progress Notes (Signed)
[place] received OR for prayer for pt. from RN.  Pt. in bed lying down w/lights on.  Nettie introduced himself and had brief conversation w/pt.  Pt. shared she has been here at [facility] all day and is very tired; suffered a fall that was 'not like other falls I've had --I knew something was wrong' and came to [facility] via EMS.  Pt. requested prayer for medical team taking care of her.  [facility] offered prayer and coordinated w/RN staff to find pt. extra blankets.  [place] will suggest follow-up visit for further support to next chaplain.       02/24/19 0000  Clinical Encounter Type  Visited With Patient;Health care provider  Visit Type Initial;Psychological support;Social support;Spiritual support  Referral From Nurse  Consult/Referral To Chaplain  Spiritual Encounters  Spiritual Needs Prayer;Emotional  Stress Factors  Patient Stress Factors Health changes;Exhausted

## 2019-02-25 ENCOUNTER — Inpatient Hospital Stay: Payer: Medicaid Other

## 2019-02-25 DIAGNOSIS — M79671 Pain in right foot: Secondary | ICD-10-CM

## 2019-02-25 LAB — GLUCOSE, CAPILLARY
Glucose-Capillary: 99 mg/dL (ref 70–99)
Glucose-Capillary: 173 mg/dL — ABNORMAL HIGH (ref 70–99)
Glucose-Capillary: 89 mg/dL (ref 70–99)
Glucose-Capillary: 110 mg/dL — ABNORMAL HIGH (ref 70–99)

## 2019-02-25 LAB — MRSA PCR SCREENING: MRSA by PCR: POSITIVE — AB

## 2019-02-25 MED ORDER — SENNOSIDES-DOCUSATE SODIUM 8.6-50 MG PO TABS: 1.0000 | ORAL_TABLET | Freq: Every evening | ORAL | Status: DC | PRN

## 2019-02-25 MED ORDER — TORSEMIDE 20 MG PO TABS
40.0000 mg | ORAL_TABLET | Freq: Every day | ORAL | Status: DC
  Administered 2019-02-25 – 2019-02-27 (×3): 40 mg via ORAL
  Filled 2019-02-25 (×3): qty 2

## 2019-02-25 MED ORDER — MUPIROCIN 2 % EX OINT
1.0000 "application " | TOPICAL_OINTMENT | Freq: Two times a day (BID) | CUTANEOUS | Status: DC
  Administered 2019-02-25 – 2019-02-27 (×5): 1 via NASAL
  Filled 2019-02-25: qty 22

## 2019-02-25 MED ORDER — CHLORHEXIDINE GLUCONATE CLOTH 2 % EX PADS
6.0000 | MEDICATED_PAD | Freq: Every day | CUTANEOUS | Status: DC
  Administered 2019-02-25 – 2019-02-27 (×3): 6 via TOPICAL

## 2019-02-25 MED ORDER — ENOXAPARIN SODIUM 40 MG/0.4ML ~~LOC~~ SOLN
40.0000 mg | SUBCUTANEOUS | Status: DC
  Administered 2019-02-25 – 2019-02-26 (×2): 40 mg via SUBCUTANEOUS
  Filled 2019-02-25 (×2): qty 0.4

## 2019-02-25 MED ORDER — LACTULOSE 10 GM/15ML PO SOLN
20.0000 g | Freq: Three times a day (TID) | ORAL | Status: DC
  Administered 2019-02-25 – 2019-02-27 (×6): 20 g via ORAL
  Filled 2019-02-25 (×6): qty 30

## 2019-02-25 MED ORDER — ADULT MULTIVITAMIN W/MINERALS CH
1.0000 | ORAL_TABLET | Freq: Every day | ORAL | Status: DC
  Administered 2019-02-25 – 2019-02-27 (×3): 1 via ORAL
  Filled 2019-02-25 (×3): qty 1

## 2019-02-25 MED ORDER — AMMONIUM LACTATE 12 % EX CREA
1.0000 g | TOPICAL_CREAM | Freq: Two times a day (BID) | CUTANEOUS | Status: DC
  Administered 2019-02-25 – 2019-02-27 (×4): 1 g via TOPICAL
  Filled 2019-02-25: qty 385

## 2019-02-25 MED ORDER — SPIRONOLACTONE 25 MG PO TABS
12.5000 mg | ORAL_TABLET | Freq: Every day | ORAL | Status: DC
  Administered 2019-02-25 – 2019-02-27 (×3): 12.5 mg via ORAL
  Filled 2019-02-25 (×3): qty 1
  Filled 2019-02-25 (×3): qty 0.5

## 2019-02-25 NOTE — Progress Notes (Signed)
   02/25/19 1500  Clinical Encounter Type  Visited With Patient  Visit Type Initial  Referral From Chaplain  Consult/Referral To Chaplain  Chaplain stopped to see patient as referred by on-call-chaplain as follow up, but patient was sleeping.

## 2019-02-25 NOTE — NC FL2 (Signed)
[place] LEVEL OF CARE SCREENING TOOL     IDENTIFICATION  Patient Name: [name] Birthdate: 05/09/1955 Sex: female Admission Date (Current Location): 02/23/2019  [facility] and [place] Number:  Engineering geologist and Address:         Provider Number: [id number]  Attending Physician Name and Address:  Louellen Molder, MD  Relative Name and Phone Number:       Current Level of Care: Hospital Recommended Level of Care: [place] Prior Approval Number:    Date Approved/Denied:   PASRR Number: [id number]  Discharge Plan: SNF    Current Diagnoses: Patient Active Problem List   Diagnosis Date Noted  . Fall 02/24/2019  . UTI (urinary tract infection) 02/23/2019  . AVM (arteriovenous malformation) of small bowel, acquired with hemorrhage   . Epistaxis 11/29/2018  . Altered mental status   . Heme positive stool   . Gastritis and gastroduodenitis   . Encephalopathy acute   . Acute on chronic blood loss anemia 11/20/2018  . Pressure injury of skin 10/26/2018  . Anxiety 08/15/2018  . Chronic back pain 08/15/2018  . GERD (gastroesophageal reflux disease) 08/15/2018  . History of carpal tunnel syndrome 08/15/2018  . HNP (herniated nucleus pulposus), lumbar 08/15/2018  . Hyperlipidemia 08/15/2018  . Hypertension 08/15/2018  . Insomnia 08/15/2018  . Left ventricular hypertrophy 08/15/2018  . Obesity 08/15/2018  . Sleep apnea 08/15/2018  . Severe anemia 06/20/2018  . Diabetic polyneuropathy associated with type 2 diabetes mellitus ([place]) 02/23/2018  . Moderate tricuspid regurgitation 02/18/2018  . Acute on chronic diastolic CHF (congestive heart failure) ([place]) 02/08/2018  . 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 Depression 02/05/2018  . Generalized abdominal pain 02/05/2018  . CHF (congestive heart failure) ([place]) 01/13/2018  . B12 deficiency 12/07/2017  . Iron deficiency anemia due to chronic blood loss 12/07/2017  . Folate  deficiency 12/07/2017  . Upper GI bleed 11/03/2017  . COPD (chronic obstructive pulmonary disease) ([place]) 09/26/2017  . Symptomatic anemia 09/26/2017  . Acute on chronic systolic CHF (congestive heart failure) ([place]) 08/27/2017  . Anemia   . Weakness   . Lower GI bleeding 08/07/2017  . Chronic systolic heart failure ([place]) 12/28/2016  . Diabetes ([place]) 12/28/2016  . Tobacco use 12/28/2016  . NSTEMI (non-ST elevated myocardial infarction) ([place])   . Sepsis ([place]) 12/18/2016  . CHF exacerbation ([place]) 12/04/2015  . PNA (pneumonia) 10/28/2015  . COPD exacerbation ([place]) 08/29/2015  . Chronic hepatitis C without hepatic coma ([place]) 05/21/2014  . CAD (coronary artery disease) 07/19/2013  . Right arm pain 06/26/2013  . Cervical pain (neck) 03/28/2013  . Radiculopathy affecting upper extremity 03/28/2013  . Impaired renal function 12/21/2012  . Abnormal mammogram 12/20/2012  . Diabetes mellitus type 2, uncontrolled ([place]) 12/20/2012    Orientation RESPIRATION BLADDER Height & Weight     Self, Time, Situation, Place  Normal Incontinent, External catheter Weight: 92.2 kg Height:  5' 5"  (165.1 cm)  BEHAVIORAL SYMPTOMS/MOOD NEUROLOGICAL BOWEL NUTRITION STATUS      Continent Diet(Heart Health Carb modified)  AMBULATORY STATUS COMMUNICATION OF NEEDS Skin   Extensive Assist Verbally Other (Comment)(moisture associated damage)                       Personal Care Assistance Level of Assistance              Functional Limitations Info             SPECIAL CARE FACTORS FREQUENCY  PT (By licensed PT), OT (By licensed OT)                    Contractures Contractures Info: Not present    Additional Factors Info  Code Status, Allergies Code Status Info: Full Allergies Info: Latex, gabapentin           Current Medications (02/25/2019):  This is the current hospital active medication list Current Facility-Administered Medications  Medication Dose Route Frequency Provider  Last Rate Last Admin  . 0.9 %  sodium chloride infusion   Intravenous PRN Sidney Ace, MD   Stopped at 02/24/19 1755  . acetaminophen (TYLENOL) tablet 650 mg  650 mg Oral Q6H PRN Lenore Cordia, MD       Or  . acetaminophen (TYLENOL) suppository 650 mg  650 mg Rectal Q6H PRN Zada Finders R, MD      . albuterol (VENTOLIN HFA) 108 (90 Base) MCG/ACT inhaler 1-2 puff  1-2 puff Inhalation Q4H PRN Zada Finders R, MD      . ammonium lactate (AMLACTIN) 12 % cream 1 g  1 g Topical BID Dhungel, Nishant, MD   1 g at 02/25/19 1528  . atorvastatin (LIPITOR) tablet 40 mg  40 mg Oral q1800 Lenore Cordia, MD   40 mg at 02/24/19 1751  . carvedilol (COREG) tablet 3.125 mg  3.125 mg Oral BID WC Zada Finders R, MD   3.125 mg at 02/25/19 1528  . cefTRIAXone (ROCEPHIN) 1 g in sodium chloride 0.9 % 100 mL IVPB  1 g Intravenous Q24H Lenore Cordia, MD   Stopped at 02/24/19 1845  . Chlorhexidine Gluconate Cloth 2 % PADS 6 each  6 each Topical Q0600 Dhungel, Nishant, MD   6 each at 02/25/19 1211  . enoxaparin (LOVENOX) injection 40 mg  40 mg Subcutaneous Q24H Dhungel, Nishant, MD      . feeding supplement (PRO-STAT SUGAR FREE 64) liquid 30 mL  30 mL Oral BID Zada Finders R, MD   30 mL at 02/25/19 0800  . fluticasone (FLONASE) 50 MCG/ACT nasal spray 2 spray  2 spray Each Nare Daily Lenore Cordia, MD   2 spray at 02/25/19 0801  . folic acid (FOLVITE) tablet 1 mg  1 mg Oral Daily Zada Finders R, MD   1 mg at 02/25/19 0800  . insulin aspart (novoLOG) injection 0-9 Units  0-9 Units Subcutaneous TID WC Lenore Cordia, MD   2 Units at 02/25/19 1211  . lactulose (CHRONULAC) 10 GM/15ML solution 20 g  20 g Oral TID Dhungel, Nishant, MD   20 g at 02/25/19 1528  . mirtazapine (REMERON SOL-TAB) disintegrating tablet 30 mg  30 mg Oral QHS Zada Finders R, MD   30 mg at 02/24/19 0014  . multivitamin with minerals tablet 1 tablet  1 tablet Oral Daily Dhungel, Nishant, MD   1 tablet at 02/25/19 1528  . mupirocin  ointment (BACTROBAN) 2 % 1 application  1 application Nasal BID Dhungel, Nishant, MD   1 application at 91/47/82 1212  . ondansetron (ZOFRAN) tablet 4 mg  4 mg Oral Q6H PRN Lenore Cordia, MD       Or  . ondansetron (ZOFRAN) injection 4 mg  4 mg Intravenous Q6H PRN Zada Finders R, MD      . pantoprazole (PROTONIX) EC tablet 40 mg  40 mg Oral BID Zada Finders R, MD   40 mg at 02/25/19 0800  . pregabalin (LYRICA) capsule 150 mg  150 mg Oral BID Zada Finders R, MD   150 mg at 02/25/19 0800  . senna-docusate (Senokot-S) tablet 1 tablet  1 tablet Oral QHS PRN Dhungel, Nishant, MD      . spironolactone (ALDACTONE) tablet 12.5 mg  12.5 mg Oral Daily Dhungel, Nishant, MD   12.5 mg at 02/25/19 1528  . torsemide (DEMADEX) tablet 40 mg  40 mg Oral Daily Dhungel, Nishant, MD   40 mg at 02/25/19 1528     Discharge Medications: Please see discharge summary for a list of discharge medications.  Relevant Imaging Results:  Relevant Lab Results:   Additional Information ss#[phone]  Michael Walrath, Illene Silver, RN

## 2019-02-25 NOTE — Evaluation (Signed)
Occupational Therapy Evaluation Patient Details Name: [name] MRN: [id number] DOB: November 18, 1955 Today's Date: 02/25/2019    History of Present Illness [name] is a 64 y.o. female presents to the ER via EMS with c/o neck and back pain s/p a fall when her legs gave out upon attempt to stand. Per ED documentation, patient was found by first responders in her home sitting on the couch covered in stool and urine.  She reportedly told them that she had been like that for 4 days.  She lives with her husband who is her caregiver and who is wheelchair-bound. Pt with PMH of DMII, CHF, CKD, COPD, N-STEMI. Patient with recent admissions on 11/20/2018 and 11/29 for GI bleed.   Clinical Impression   Pt was seen for OT evaluation this date. Prior to hospital admission, pt was requiring some assist for ADLs including bathing and dressing from her husband, niece and sometimes-friends. Pt lives in [facility] with her spouse she reports, but some PLOF information is contradicting as pt also reports residing in SNF at some point. Currently pt demonstrates impairments as described below (See OT problem list) which functionally limit her ability to perform ADL/self-care tasks. Pt currently requires MIN/MOD A with ADL transfers from elevated surface, MIN A to CGA for static standing balance with RW, MIN A for most UB ADLs and MAX A for LB ADLs including threading socks in sitting.  Pt would benefit from skilled OT to address noted impairments and functional limitations (see below for any additional details) in order to maximize safety and independence while minimizing falls risk and caregiver burden. Upon hospital discharge, recommend STR at SNF to maximize pt safety and return to PLOF.     Follow Up Recommendations  SNF    Equipment Recommendations  3 in 1 bedside commode    Recommendations for Other Services       Precautions / Restrictions Precautions Precautions: Fall Restrictions Weight Bearing  Restrictions: No      Mobility Bed Mobility Overal bed mobility: Needs Assistance Bed Mobility: Supine to Sit;Sit to Supine     Supine to sit: Min assist Sit to supine: Min assist   General bed mobility comments: MIN A to manage LEs in/out of bed  Transfers Overall transfer level: Needs assistance Equipment used: Rolling walker (2 wheeled) Transfers: Sit to/from Stand Sit to Stand: Min assist;Mod assist;From elevated surface         General transfer comment: rquires 2 trials to CTS and bed surface elevated, endorses having difficulty getting up from furniture at home and sometimes having to call friends for help.    Balance Overall balance assessment: Needs assistance Sitting-balance support: No upper extremity supported;Feet unsupported Sitting balance-Leahy Scale: Good     Standing balance support: Bilateral upper extremity supported Standing balance-Leahy Scale: Poor Standing balance comment: requires MIN A-CGA A for static standing balance, UE support through RW                           ADL either performed or assessed with clinical judgement   ADL                                         General ADL Comments: Pt requires MIN/MOD A with ADL transfers, MAX A with LB dressing, MIN A with UB dressing, setup to wash face and underarms in  sitting. Requires MIN A to wash back of neck/shoudlers d/t limited UE ROM. fxl mobility not attempted at this time d/t c/o R LE pain that MD presents and states will address by ordering an ultrasound.     Vision Baseline Vision/History: Wears glasses Wears Glasses: At all times Patient Visual Report: No change from baseline       Perception     Praxis      Pertinent Vitals/Pain Pain Assessment: Faces Faces Pain Scale: Hurts even more Pain Location: R LE shin/calf area sensistive to the touch Pain Descriptors / Indicators: Grimacing;Guarding;Tender Pain Intervention(s): Monitored during  session;Repositioned     Hand Dominance Right   Extremity/Trunk Assessment Upper Extremity Assessment Upper Extremity Assessment: Generalized weakness;RUE deficits/detail;LUE deficits/detail RUE Deficits / Details: shld flex 50% arc of motion, 3-/5 MMT, elbow 3+/5, R grip 3/5 LUE Deficits / Details: shld flex 50% arc of motion, 3-/5 MMT, elbow 3+/5, L grip 3+/5   Lower Extremity Assessment Lower Extremity Assessment: Defer to PT evaluation;Generalized weakness       Communication Communication Communication: No difficulties;Other (comment)(some drowsiness-delayed responses, mostly while lying in bed, somewhat clearer speech when sitting up)   Cognition Arousal/Alertness: Awake/alert Behavior During Therapy: WFL for tasks assessed/performed Overall Cognitive Status: No family/caregiver present to determine baseline cognitive functioning                                 General Comments: Pt is not oriented to month or year, does know she's in the hospital and vaguely recalls falling, but does not know which hospital. Pt's PLOF/home layout information somewhat contradictory. Generally follows commands well.   General Comments       Exercises Other Exercises Other Exercises: OT facilitates education re: role of OT in acute setting as well as potential need for OT in rehab setting and what that could consist of. Pt demos moderate reception of education Other Exercises: OT facilitates education re: safe hand placement relative to RW for sit<>stand. Pt demos moderate reception of education, reponds best to tactile cues.   Shoulder Instructions      Home Living Family/patient expects to be discharged to:: Skilled nursing facility Living Arrangements: Spouse/significant other                               Additional Comments: lives with husband who is wheelchair-bound and per pt, no longer able to assist her with ADLs      Prior Functioning/Environment  Level of Independence: Needs assistance  Gait / Transfers Assistance Needed: Per documentation from recent hospitalizations, pt was ambulating short bouts with RW; however, pt states she hasn't walked in 2 years and was in nursing home/assisted living for 2 years up until 2 weeks ago. Pt also states husband was previously assisting her with ADLs but he is no longer able to and needs help himself. ADL's / Homemaking Assistance Needed: Pt states her friend, neice or husband has to help her with bathing and dressing at baseline, but states that husband is no longer able to help and she has gradually been needing more assistance.   Comments: Pt is somewhat poor historian, unable to state time or specific place although she does recognize that she's in the hospital. Information listed above is combination of gleaned from previous documentation as well as some pt provided information.        OT Problem  List: Decreased strength;Decreased range of motion;Decreased activity tolerance;Impaired balance (sitting and/or standing);Decreased safety awareness;Decreased knowledge of use of DME or AE;Impaired sensation;Pain      OT Treatment/Interventions: Self-care/ADL training;Therapeutic exercise;Energy conservation;DME and/or AE instruction;Therapeutic activities;Patient/family education;Balance training    OT Goals(Current goals can be found in the care plan section) Acute Rehab OT Goals Patient Stated Goal: to be able to walk again OT Goal Formulation: With patient Time For Goal Achievement: 03/11/19 Potential to Achieve Goals: Good  OT Frequency: Min 1X/week   Barriers to D/C:            Co-evaluation              AM-PAC OT "6 Clicks" Daily Activity     Outcome Measure Help from another person eating meals?: None Help from another person taking care of personal grooming?: A Little Help from another person toileting, which includes using toliet, bedpan, or urinal?: A Lot Help from another  person bathing (including washing, rinsing, drying)?: A Lot Help from another person to put on and taking off regular upper body clothing?: A Little Help from another person to put on and taking off regular lower body clothing?: A Lot 6 Click Score: 16   End of Session Equipment Utilized During Treatment: Gait belt;Rolling walker Nurse Communication: Mobility status;Other (comment)(MD present when pt c/o R LE pain-shallow pain, sensistive to touch, MD orders ultrasound)  Activity Tolerance: Patient tolerated treatment well;Patient limited by pain Patient left: in bed;with call bell/phone within reach;with bed alarm set  OT Visit Diagnosis: Unsteadiness on feet (R26.81);Muscle weakness (generalized) (M62.81)                Time: 2671-2458 OT Time Calculation (min): 42 min Charges:  OT General Charges $OT Visit: 1 Visit OT Evaluation $OT Eval Moderate Complexity: 1 Mod OT Treatments $Self Care/Home Management : 8-22 mins $Therapeutic Activity: 8-22 mins  Gerrianne Scale, MS, OTR/L ascom [phone] 02/25/19, 1:48 PM

## 2019-02-25 NOTE — Progress Notes (Addendum)
PROGRESS NOTE                                                                                                                                                                                                             Patient Demographics:    [name], is a 64 y.o. female, DOB - 08-04-1955, VEL:[id number]  Admit date - 02/23/2019   Admitting Physician Sidney Ace, MD  Outpatient Primary MD for the patient is [facility]  Outpatient Specialists: None  Chief Complaint  Patient presents with  . Fall       Brief Narrative 64 year old female with chronic systolic CHF/restrictive cardiomyopathy, CAD, COPD, chronic hep C with cirrhosis, chronic kidney disease stage III, diet-controlled diabetes mellitus, hypertension, hyperlipidemia, anemia chronic disease, history of GI bleed secondary to AVMs and chronic angiectasis brought to the ED by EMS with generalized weakness and recent fall. Patient reported that she was getting up from her couch when her legs gave way and fell on the ground.  Denies loss of consciousness, chest pain, palpitation, shortness of breath, fevers, bowel or urinary symptoms. As per ED documentation, the first responders at her home found her sitting in the couch covered in stool and urine.  She told me that she was feeling like that for past 4 days.  Patient was hospitalized in the past 2 months with GI bleed requiring multiple transfusions. Patient found to have UTI and admitted for generalized weakness with failure to thrive.   Subjective:   Seen and examined.  Complains of severe pain in her right foot and calf.   Assessment  & Plan :    Principal Problem:   UTI (urinary tract infection) Complete 3 days of IV Rocephin today.  Denies any dysuria.  Active Problems: Generalized weakness with failure to thrive and recurrent fall   PT/OT recommend SNF.  Consulted TOC.   Referral sent.  Patient unsafe to return home.  Chronic iron deficiency anemia/chronic blood loss. Recent history of GI bleed.  H&H currently stable.  Continue PPI.  Chronic kidney disease stage IIIb Baseline creatinine around 1.7-1.9. stable.  Chronic hep C with liver cirrhosis Compensated.  Resume lactulose, torsemide and Aldactone.  Chronic systolic CHF with restrictive cardiomyopathy Euvolemic.  Resume torsemide and Aldactone.  Continue Coreg and statin. Diet-controlled type 2 diabetes mellitus  Stable.  Continue SSI  COPD Stable.  Continue home inhaler  Right foot and calf pain.  Has significant tenderness on minimal exam.  No obvious swelling. Doppler lower extremity negative for DVT.  Degenerative changes of the right foot.  Was able to take few steps with OT today.     Code Status : Full code  Family Communication  : None  Disposition Plan  : SNF possibly in the next 24 hours if weakness improves  Barriers For Discharge : Improving denies weakness  Consults  : None  Procedures  : Doppler lower extremity  DVT Prophylaxis  : Subcu Lovenox Lab Results  Component Value Date   PLT 146 (L) 02/24/2019    Antibiotics  :    Anti-infectives (From admission, onward)   Start     Dose/Rate Route Frequency Ordered Stop   02/24/19 1800  cefTRIAXone (ROCEPHIN) 1 g in sodium chloride 0.9 % 100 mL IVPB     1 g 200 mL/hr over 30 Minutes Intravenous Every 24 hours 02/23/19 2229     02/23/19 2115  cefTRIAXone (ROCEPHIN) 1 g in sodium chloride 0.9 % 100 mL IVPB     1 g 200 mL/hr over 30 Minutes Intravenous  Once 02/23/19 2107 02/23/19 2214        Objective:   Vitals:   02/25/19 0445 02/25/19 0550 02/25/19 0753 02/25/19 1149  BP:  (!) 152/70 (!) 141/69 (!) 128/59  Pulse:  84 83 79  Resp:  18 18 17   Temp:  97.9 F (36.6 C)  98.5 F (36.9 C)  TempSrc:  Oral  Oral  SpO2:  98% 99% 99%  Weight: 92.2 kg     Height: 5' 5"  (1.651 m)       Wt Readings from Last 3  Encounters:  02/25/19 92.2 kg  01/02/19 77.5 kg  12/07/18 104.4 kg     Intake/Output Summary (Last 24 hours) at 02/25/2019 1426 Last data filed at 02/25/2019 1300 Gross per 24 hour  Intake 640.11 ml  Output 1150 ml  Net -509.89 ml     Physical Exam  Gen: not in distress HEENT: Pallor present moist mucosa, supple neck Chest: clear b/l, no added sounds CVS: N S1&S2, no murmurs, GI: soft, NT, ND, BS+ Musculoskeletal: warm, no edema, tender to pressure over the right calf and foot     Data Review:    CBC Recent Labs  Lab 02/23/19 1828 02/24/19 0611  WBC 7.7 5.6  HGB 10.8* 9.1*  HCT 35.2* 30.5*  PLT 165 146*  MCV 82.8 83.6  MCH 25.4* 24.9*  MCHC 30.7 29.8*  RDW 17.5* 17.3*    Chemistries  Recent Labs  Lab 02/23/19 1828 02/24/19 0611  NA 138 140  K 4.6 4.6  CL 106 111  CO2 21* 21*  GLUCOSE 139* 123*  BUN 37* 37*  CREATININE 1.62* 1.47*  CALCIUM 9.4 8.8*  AST 23  --   ALT 18  --   ALKPHOS 156*  --   BILITOT 0.6  --    ------------------------------------------------------------------------------------------------------------------ No results for input(s): CHOL, HDL, LDLCALC, TRIG, CHOLHDL, LDLDIRECT in the last 72 hours.  Lab Results  Component Value Date   HGBA1C 5.5 01/01/2019   ------------------------------------------------------------------------------------------------------------------ No results for input(s): TSH, T4TOTAL, T3FREE, THYROIDAB in the last 72 hours.  Invalid input(s): FREET3 ------------------------------------------------------------------------------------------------------------------ No results for input(s): VITAMINB12, FOLATE, FERRITIN, TIBC, IRON, RETICCTPCT in the last 72 hours.  Coagulation profile No results for input(s): INR, PROTIME in the last 168 hours.  No results for input(s): DDIMER in the last 72 hours.  Cardiac Enzymes No results for input(s): CKMB, TROPONINI, MYOGLOBIN in the last 168 hours.  Invalid  input(s): CK ------------------------------------------------------------------------------------------------------------------    Component Value Date/Time   BNP 861.0 (H) 08/17/2018 1030    Inpatient Medications  Scheduled Meds: . atorvastatin  40 mg Oral q1800  . carvedilol  3.125 mg Oral BID WC  . Chlorhexidine Gluconate Cloth  6 each Topical Q0600  . feeding supplement (PRO-STAT SUGAR FREE 64)  30 mL Oral BID  . fluticasone  2 spray Each Nare Daily  . folic acid  1 mg Oral Daily  . insulin aspart  0-9 Units Subcutaneous TID WC  . mirtazapine  30 mg Oral QHS  . mupirocin ointment  1 application Nasal BID  . pantoprazole  40 mg Oral BID  . pregabalin  150 mg Oral BID   Continuous Infusions: . sodium chloride Stopped (02/24/19 1755)  . cefTRIAXone (ROCEPHIN)  IV Stopped (02/24/19 1845)   PRN Meds:.sodium chloride, acetaminophen **OR** acetaminophen, albuterol, ondansetron **OR** ondansetron (ZOFRAN) IV  Micro Results Recent Results (from the past 240 hour(s))  Culture, blood (routine x 2)     Status: None (Preliminary result)   Collection Time: 02/23/19  6:28 PM   Specimen: BLOOD  Result Value Ref Range Status   Specimen Description BLOOD LEFT ANTECUBITAL  Final   Special Requests   Final    BOTTLES DRAWN AEROBIC AND ANAEROBIC Blood Culture adequate volume   Culture   Final    NO GROWTH 2 DAYS Performed at [facility], [address] [place], [place] [address]    Report Status PENDING  Incomplete  Urine culture     Status: Abnormal (Preliminary result)   Collection Time: 02/23/19  6:57 PM   Specimen: Urine, Clean Catch  Result Value Ref Range Status   Specimen Description   Final    URINE, CLEAN CATCH Performed at [facility], [address], [place], [place] [address]    Special Requests   Final    NONE Performed at [facility], [address] [place], [place] [address]    Culture (A)  Final    >=100,000 COLONIES/mL  ENTEROBACTER ASBURIAE CULTURE REINCUBATED FOR BETTER GROWTH Performed at [facility], [place] [address] [place], [place] [address]    Report Status PENDING  Incomplete  Respiratory Panel by RT PCR (Flu A&B, Covid) - Nasopharyngeal Swab     Status: None   Collection Time: 02/23/19  8:19 PM   Specimen: Nasopharyngeal Swab  Result Value Ref Range Status   SARS Coronavirus 2 by RT PCR NEGATIVE NEGATIVE Final    Comment: (NOTE) SARS-CoV-2 target nucleic acids are NOT DETECTED. The SARS-CoV-2 RNA is generally detectable in upper respiratoy specimens during the acute phase of infection. The lowest concentration of SARS-CoV-2 viral copies this assay can detect is 131 copies/mL. A negative result does not preclude SARS-Cov-2 infection and should not be used as the sole basis for treatment or other patient management decisions. A negative result may occur with  improper specimen collection/handling, submission of specimen other than nasopharyngeal swab, presence of viral mutation(s) within the areas targeted by this assay, and inadequate number of viral copies (<131 copies/mL). A negative result must be combined with clinical observations, patient history, and epidemiological information. The expected result is Negative. Fact Sheet for Patients:  PinkCheek.be Fact Sheet for Healthcare Providers:  GravelBags.it This test is not yet ap proved or cleared  by the [place] and  has been authorized for detection and/or diagnosis of SARS-CoV-2 by FDA under an Emergency Use Authorization (EUA). This EUA will remain  in effect (meaning this test can be used) for the duration of the COVID-19 declaration under Section 564(b)(1) of the Act, 21 U.S.C. section 360bbb-3(b)(1), unless the authorization is terminated or revoked sooner.    Influenza A by PCR NEGATIVE NEGATIVE Final   Influenza B by PCR NEGATIVE NEGATIVE Final    Comment:  (NOTE) The Xpert Xpress SARS-CoV-2/FLU/RSV assay is intended as an aid in  the diagnosis of influenza from Nasopharyngeal swab specimens and  should not be used as a sole basis for treatment. Nasal washings and  aspirates are unacceptable for Xpert Xpress SARS-CoV-2/FLU/RSV  testing. Fact Sheet for Patients: PinkCheek.be Fact Sheet for Healthcare Providers: GravelBags.it This test is not yet approved or cleared by the [place] FDA and  has been authorized for detection and/or diagnosis of SARS-CoV-2 by  FDA under an Emergency Use Authorization (EUA). This EUA will remain  in effect (meaning this test can be used) for the duration of the  Covid-19 declaration under Section 564(b)(1) of the Act, 21  U.S.C. section 360bbb-3(b)(1), unless the authorization is  terminated or revoked. Performed at [facility], [address] [place], [place] [address]   MRSA PCR Screening     Status: Abnormal   Collection Time: 02/25/19  8:08 AM   Specimen: Nasopharyngeal  Result Value Ref Range Status   MRSA by PCR POSITIVE (A) NEGATIVE Final    Comment:        The GeneXpert MRSA Assay (FDA approved for NASAL specimens only), is one component of a comprehensive MRSA colonization surveillance program. It is not intended to diagnose MRSA infection nor to guide or monitor treatment for MRSA infections. RESULT CALLED TO, READ BACK BY AND VERIFIED WITHLum Babe RN AT [phone] ON 02/25/19 [facility]  Performed at [facility], [address] [place], [place] [address]     Radiology Reports DG Cervical Spine 2-3 Views  Result Date: 02/23/2019 CLINICAL DATA:  Pain after fall EXAM: CERVICAL SPINE - 2-3 VIEW COMPARISON:  08/25/2008. Swimmer's view is on the thoracic spine study performed today. FINDINGS: Degenerative disc and facet disease diffusely throughout the cervical spine. No fracture is visualized. There is prominent  soft tissue in the prevertebral region. IMPRESSION: No visible fracture or subluxation. Prevertebral soft tissue swelling noted. This could be further evaluated with cervical spine CT. Electronically Signed   By: Rolm Baptise M.D.   On: 02/23/2019 20:19   DG Thoracic Spine 2 View  Result Date: 02/23/2019 CLINICAL DATA:  Back pain after fall EXAM: THORACIC SPINE 2 VIEWS COMPARISON:  None. FINDINGS: Degenerative spurring throughout the thoracic spine. Normal alignment. No fracture or focal bone lesion. IMPRESSION: No acute bony abnormality. Electronically Signed   By: Rolm Baptise M.D.   On: 02/23/2019 20:18   US Venous Img Lower Unilateral Right (DVT)  Result Date: 02/25/2019 CLINICAL DATA:  Right foot pain and edema. EXAM: RIGHT LOWER EXTREMITY VENOUS DOPPLER ULTRASOUND TECHNIQUE: Gray-scale sonography with graded compression, as well as color Doppler and duplex ultrasound were performed to evaluate the lower extremity deep venous systems from the level of the common femoral vein and including the common femoral, femoral, profunda femoral, popliteal and calf veins including the posterior tibial, peroneal and gastrocnemius veins when visible. The superficial great saphenous vein was also interrogated. Spectral Doppler was utilized to evaluate flow  at rest and with distal augmentation maneuvers in the common femoral, femoral and popliteal veins. COMPARISON:  None. FINDINGS: Contralateral Common Femoral Vein: Respiratory phasicity is normal and symmetric with the symptomatic side. No evidence of thrombus. Normal compressibility. Common Femoral Vein: No evidence of thrombus. Normal compressibility, respiratory phasicity and response to augmentation. Saphenofemoral Junction: No evidence of thrombus. Normal compressibility and flow on color Doppler imaging. Profunda Femoral Vein: No evidence of thrombus. Normal compressibility and flow on color Doppler imaging. Femoral Vein: No evidence of thrombus. Normal  compressibility, respiratory phasicity and response to augmentation. Popliteal Vein: No evidence of thrombus. Normal compressibility, respiratory phasicity and response to augmentation. Calf Veins: No evidence of thrombus. Normal compressibility and flow on color Doppler imaging. Superficial Great Saphenous Vein: No evidence of thrombus. Normal compressibility. Venous Reflux:  None. Other Findings: No evidence of superficial thrombophlebitis or abnormal fluid collection. There are some mildly prominent right inguinal lymph nodes present with the largest measuring approximately 1.6 cm in short axis. The visualized inguinal lymph nodes demonstrate normal lymph node architecture. IMPRESSION: 1. No evidence of right lower extremity deep venous thrombosis. 2. Mildly prominent right inguinal lymph nodes demonstrating normal lymph node architecture. Electronically Signed   By: Aletta Edouard M.D.   On: 02/25/2019 11:27   DG Chest Port 1 View  Result Date: 02/23/2019 CLINICAL DATA:  Fall EXAM: PORTABLE CHEST 1 VIEW COMPARISON:  11/20/2018 FINDINGS: Prior median sternotomy. Cardiomegaly. No confluent opacities or effusions. No edema. No acute bony abnormality. IMPRESSION: Cardiomegaly.  No active disease. Electronically Signed   By: Rolm Baptise M.D.   On: 02/23/2019 19:12   DG Foot Complete Right  Result Date: 02/25/2019 CLINICAL DATA:  Right foot pain. History of diabetes. EXAM: RIGHT FOOT COMPLETE - 3+ VIEW COMPARISON:  Radiographs 05/13/2008. No recent prior studies. FINDINGS: The mineralization and alignment are normal. There is no evidence of acute fracture or dislocation. Status post distal fibular plate and screw fixation with intact hardware. There is some irregularity of the posterior tibia on the lateral view which does not appear acute, although does not clearly seen on the prior radiographs. 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 The soft tissues appear unremarkable. IMPRESSION: No acute osseous findings. Mild midfoot degenerative changes and prior distal fibular ORIF. Mild irregularity of the posterior tibia does not appear acute. Electronically Signed   By: Richardean Sale M.D.   On: 02/25/2019 11:34    Time Spent in minutes  25   Denene Alamillo M.D on 02/25/2019 at 2:26 PM  Between 7am to 7pm - Pager - [phone]  After 7pm go to [email or web address] - password [facility]  [facility] -  Office  [phone]

## 2019-02-26 DIAGNOSIS — E1142 Type 2 diabetes mellitus with diabetic polyneuropathy: Secondary | ICD-10-CM

## 2019-02-26 LAB — BASIC METABOLIC PANEL
Anion gap: 11 (ref 5–15)
BUN: 45 mg/dL — ABNORMAL HIGH (ref 8–23)
CO2: 21 mmol/L — ABNORMAL LOW (ref 22–32)
Calcium: 9 mg/dL (ref 8.9–10.3)
Chloride: 104 mmol/L (ref 98–111)
Creatinine, Ser: 1.76 mg/dL — ABNORMAL HIGH (ref 0.44–1.00)
GFR calc Af Amer: 35 mL/min — ABNORMAL LOW (ref >60)
GFR calc non Af Amer: 30 mL/min — ABNORMAL LOW (ref >60)
Glucose, Bld: 134 mg/dL — ABNORMAL HIGH (ref 70–99)
Potassium: 4.4 mmol/L (ref 3.5–5.1)
Sodium: 136 mmol/L (ref 135–145)

## 2019-02-26 LAB — GLUCOSE, CAPILLARY
Glucose-Capillary: 133 mg/dL — ABNORMAL HIGH (ref 70–99)
Glucose-Capillary: 180 mg/dL — ABNORMAL HIGH (ref 70–99)
Glucose-Capillary: 97 mg/dL (ref 70–99)
Glucose-Capillary: 133 mg/dL — ABNORMAL HIGH (ref 70–99)

## 2019-02-26 MED ORDER — CEPHALEXIN 500 MG PO CAPS: 500.0000 mg | ORAL_CAPSULE | Freq: Two times a day (BID) | ORAL | 0 refills | Status: AC

## 2019-02-26 NOTE — Discharge Summary (Addendum)
Physician Discharge Summary  [name] GEZ:[id number] DOB: 11/16/1955 DOA: 02/23/2019  PCP: [facility]  Admit date: 02/23/2019 Discharge date: 02/26/2019  Admitted From: Home Disposition:SNF  Recommendations for Outpatient Follow-up:   1. Patient will complete 5-day course of antibiotic for UTI after 2/11.    Equipment/Devices: Has rolling walker  Discharge Condition: Fair CODE STATUS: Full code Diet recommendation: Heart Healthy /carb modified   Discharge Diagnoses:  Principal Problem:   UTI (urinary tract infection)   Active Problems:   Chronic systolic heart failure (HCC)   Weakness, generalized Chronic kidney disease stage IIIb   COPD (chronic obstructive pulmonary disease) (HCC)   Diabetic polyneuropathy associated with type 2 diabetes mellitus ([place])   Hyperlipidemia   Hypertension   Fall  Brief narrative/HPI 64 year old female with chronic systolic CHF/restrictive cardiomyopathy, CAD, COPD, chronic hep C with cirrhosis, chronic kidney disease stage III, diet-controlled diabetes mellitus, hypertension, hyperlipidemia, anemia chronic disease, history of GI bleed secondary to AVMs and chronic angiectasis brought to the ED by EMS with generalized weakness and recent fall. Patient reported that she was getting up from her couch when her legs gave way and fell on the ground.  Denies loss of consciousness, chest pain, palpitation, shortness of breath, fevers, bowel or urinary symptoms. As per ED documentation, the first responders at her home found her sitting in the couch covered in stool and urine.  She told me that she was feeling like that for past 4 days.  Patient was hospitalized in the past 2 months with GI bleed requiring multiple transfusions. Patient found to have UTI and admitted for generalized weakness with failure to thrive.  Hospital course    Principal Problem:   UTI (urinary tract infection) Urine culture growing Enterobacter, sensitivity  pending.  Has received 3 days of IV Rocephin.  I will discharge her on 2 more days of oral Keflex to complete 5-day course.  Follow sensitivity as outpatient.   Active Problems: Generalized weakness with failure to thrive and recurrent fall   PT/OT recommend SNF.    Chronic iron deficiency anemia/chronic blood loss. Recent history of GI bleed.  H&H currently stable.  Continue PPI.  Chronic kidney disease stage IIIb Baseline creatinine around 1.7-1.9. stable.  Chronic hep C with liver cirrhosis Compensated.  Resume lactulose, torsemide and Aldactone.  Chronic systolic CHF with restrictive cardiomyopathy Euvolemic.  Resumed torsemide and Aldactone.  Continue Coreg and statin.   Diet-controlled type 2 diabetes mellitus Stable.    COPD Stable.  Continue home inhaler  Right foot and calf pain.   Doppler lower extremity negative for DVT.  Degenerative changes on x-ray of of the right foot without fracture or injury.[name] Kitchen  Has been able to ambulate with help of PT.  Pain much improved today.    Family Communication  : None  Disposition Plan  :  SNF Consults  : None  Procedures  : Doppler lower extremity  Discharge Instructions   Allergies as of 02/26/2019      Reactions   Latex Anaphylaxis, Rash   Gabapentin Nausea And Vomiting, Other (See Comments)      Medication List    TAKE these medications   ammonium lactate 12 % cream Commonly known as: AMLACTIN Apply 1 g topically 2 (two) times daily.   atorvastatin 40 MG tablet Commonly known as: LIPITOR Take 40 mg by mouth daily.   carvedilol 3.125 MG tablet Commonly known as: COREG Take 1 tablet (3.125 mg total) by mouth 2 (two) times daily with  a meal.   cephALEXin 500 MG capsule Commonly known as: KEFLEX Take 1 capsule (500 mg total) by mouth 2 (two) times daily for 2 days.   feeding supplement (PRO-STAT SUGAR FREE 64) Liqd Take 30 mLs by mouth 2 (two) times daily.   fluticasone 50 MCG/ACT nasal  spray Commonly known as: FLONASE Place 2 sprays into both nostrils daily.   folic acid 1 MG tablet Commonly known as: FOLVITE Take 1 tablet (1 mg total) by mouth daily.   lactulose 10 GM/15ML solution Commonly known as: CHRONULAC Take 30 mLs (20 g total) by mouth 3 (three) times daily.   mirtazapine 30 MG disintegrating tablet Commonly known as: REMERON SOL-TAB Take 1 tablet (30 mg total) by mouth at bedtime.   multivitamin with minerals Tabs tablet Take 1 tablet by mouth daily.   pantoprazole 40 MG tablet Commonly known as: PROTONIX Take 1 tablet (40 mg total) by mouth 2 (two) times daily.   pregabalin 150 MG capsule Commonly known as: LYRICA Take 150 mg by mouth 2 (two) times daily.   RESOURCE 2.0 PO Take 1 Bottle by mouth 2 (two) times daily.   senna-docusate 8.6-50 MG tablet Commonly known as: Senokot-S Take 1 tablet by mouth at bedtime as needed for mild constipation.   spironolactone 25 MG tablet Commonly known as: ALDACTONE Take 12.5 mg by mouth daily.   torsemide 20 MG tablet Commonly known as: DEMADEX Take 20 mg by mouth daily. Take 2 tablets if weight is greater than 185 lbs. NO tablets if weight is less than 177 lbs.      Follow-up Information    MD at SNF in 1 week Follow up.          Allergies  Allergen Reactions  . Latex Anaphylaxis and Rash  . Gabapentin Nausea And Vomiting and Other (See Comments)       Procedures/Studies: DG Cervical Spine 2-3 Views  Result Date: 02/23/2019 CLINICAL DATA:  Pain after fall EXAM: CERVICAL SPINE - 2-3 VIEW COMPARISON:  08/25/2008. Swimmer's view is on the thoracic spine study performed today. FINDINGS: Degenerative disc and facet disease diffusely throughout the cervical spine. No fracture is visualized. There is prominent soft tissue in the prevertebral region. IMPRESSION: No visible fracture or subluxation. Prevertebral soft tissue swelling noted. This could be further evaluated with cervical spine CT.  Electronically Signed   By: Rolm Baptise M.D.   On: 02/23/2019 20:19   DG Thoracic Spine 2 View  Result Date: 02/23/2019 CLINICAL DATA:  Back pain after fall EXAM: THORACIC SPINE 2 VIEWS COMPARISON:  None. FINDINGS: Degenerative spurring throughout the thoracic spine. Normal alignment. No fracture or focal bone lesion. IMPRESSION: No acute bony abnormality. Electronically Signed   By: Rolm Baptise M.D.   On: 02/23/2019 20:18   US Venous Img Lower Unilateral Right (DVT)  Result Date: 02/25/2019 CLINICAL DATA:  Right foot pain and edema. EXAM: RIGHT LOWER EXTREMITY VENOUS DOPPLER ULTRASOUND TECHNIQUE: Gray-scale sonography with graded compression, as well as color Doppler and duplex ultrasound were performed to evaluate the lower extremity deep venous systems from the level of the common femoral vein and including the common femoral, femoral, profunda femoral, popliteal and calf veins including the posterior tibial, peroneal and gastrocnemius veins when visible. The superficial great saphenous vein was also interrogated. Spectral Doppler was utilized to evaluate flow at rest and with distal augmentation maneuvers in the common femoral, femoral and popliteal veins. COMPARISON:  None. FINDINGS: Contralateral Common Femoral Vein: Respiratory phasicity is normal  and symmetric with the symptomatic side. No evidence of thrombus. Normal compressibility. Common Femoral Vein: No evidence of thrombus. Normal compressibility, respiratory phasicity and response to augmentation. Saphenofemoral Junction: No evidence of thrombus. Normal compressibility and flow on color Doppler imaging. Profunda Femoral Vein: No evidence of thrombus. Normal compressibility and flow on color Doppler imaging. Femoral Vein: No evidence of thrombus. Normal compressibility, respiratory phasicity and response to augmentation. Popliteal Vein: No evidence of thrombus. Normal compressibility, respiratory phasicity and response to augmentation. Calf  Veins: No evidence of thrombus. Normal compressibility and flow on color Doppler imaging. Superficial Great Saphenous Vein: No evidence of thrombus. Normal compressibility. Venous Reflux:  None. Other Findings: No evidence of superficial thrombophlebitis or abnormal fluid collection. There are some mildly prominent right inguinal lymph nodes present with the largest measuring approximately 1.6 cm in short axis. The visualized inguinal lymph nodes demonstrate normal lymph node architecture. IMPRESSION: 1. No evidence of right lower extremity deep venous thrombosis. 2. Mildly prominent right inguinal lymph nodes demonstrating normal lymph node architecture. Electronically Signed   By: Aletta Edouard M.D.   On: 02/25/2019 11:27   DG Chest Port 1 View  Result Date: 02/23/2019 CLINICAL DATA:  Fall EXAM: PORTABLE CHEST 1 VIEW COMPARISON:  11/20/2018 FINDINGS: Prior median sternotomy. Cardiomegaly. No confluent opacities or effusions. No edema. No acute bony abnormality. IMPRESSION: Cardiomegaly.  No active disease. Electronically Signed   By: Rolm Baptise M.D.   On: 02/23/2019 19:12   DG Foot Complete Right  Result Date: 02/25/2019 CLINICAL DATA:  Right foot pain. History of diabetes. EXAM: RIGHT FOOT COMPLETE - 3+ VIEW COMPARISON:  Radiographs 05/13/2008. No recent prior studies. FINDINGS: The mineralization and alignment are normal. There is no evidence of acute fracture or dislocation. Status post distal fibular plate and screw fixation with intact hardware. There is some irregularity of the posterior tibia on the lateral view which does not appear acute, although does not clearly seen on the prior radiographs. There are mild midfoot degenerative changes, fragmented spurring of the medial malleolus and a bipartite tibial sesamoid of the 1st metatarsal. The soft tissues appear unremarkable. IMPRESSION: No acute osseous findings. Mild midfoot degenerative changes and prior distal fibular ORIF. Mild irregularity of  the posterior tibia does not appear acute. Electronically Signed   By: Richardean Sale M.D.   On: 02/25/2019 11:34       Subjective: Reports pain in her right foot and calf has significantly improved.  Discharge Exam: Vitals:   02/25/19 2000 02/26/19 0447  BP: (!) 126/55 131/62  Pulse: 83 79  Resp: 20 20  Temp: 100.3 F (37.9 C) 98.4 F (36.9 C)  SpO2: 96% 98%   Vitals:   02/25/19 1149 02/25/19 2000 02/26/19 0447 02/26/19 0500  BP: (!) 128/59 (!) 126/55 131/62   Pulse: 79 83 79   Resp: 17 20 20    Temp: 98.5 F (36.9 C) 100.3 F (37.9 C) 98.4 F (36.9 C)   TempSrc: Oral Oral Oral   SpO2: 99% 96% 98%   Weight:    90.3 kg  Height:        General: Elderly female not in distress HEENT: Moist mucosa, supple neck Chest: Clear CVs: Normal S1-S2, no murmurs GI: Soft, nondistended, nontender Musculoskeletal: Warm, no edema, minimal tenderness over the right foot    The results of significant diagnostics from this hospitalization (including imaging, microbiology, ancillary and laboratory) are listed below for reference.     Microbiology: Recent Results (from the past 240 hour(s))  Culture, blood (routine x 2)     Status: None (Preliminary result)   Collection Time: 02/23/19  6:28 PM   Specimen: BLOOD  Result Value Ref Range Status   Specimen Description BLOOD LEFT ANTECUBITAL  Final   Special Requests   Final    BOTTLES DRAWN AEROBIC AND ANAEROBIC Blood Culture adequate volume   Culture   Final    NO GROWTH 3 DAYS Performed at [facility], [address], [place], [place] [address]    Report Status PENDING  Incomplete  Urine culture     Status: Abnormal (Preliminary result)   Collection Time: 02/23/19  6:57 PM   Specimen: Urine, Clean Catch  Result Value Ref Range Status   Specimen Description   Final    URINE, CLEAN CATCH Performed at [facility], [address], [place], [place] [address]    Special Requests   Final    NONE Performed  at [facility], [address] [place], [place] [address]    Culture (A)  Final    >=100,000 COLONIES/mL ENTEROBACTER SPECIES CULTURE REINCUBATED FOR BETTER GROWTH Performed at [facility], [place] [address], [place], [place] [address]    Report Status PENDING  Incomplete  Respiratory Panel by RT PCR (Flu A&B, Covid) - Nasopharyngeal Swab     Status: None   Collection Time: 02/23/19  8:19 PM   Specimen: Nasopharyngeal Swab  Result Value Ref Range Status   SARS Coronavirus 2 by RT PCR NEGATIVE NEGATIVE Final    Comment: (NOTE) SARS-CoV-2 target nucleic acids are NOT DETECTED. The SARS-CoV-2 RNA is generally detectable in upper respiratoy specimens during the acute phase of infection. The lowest concentration of SARS-CoV-2 viral copies this assay can detect is 131 copies/mL. A negative result does not preclude SARS-Cov-2 infection and should not be used as the sole basis for treatment or other patient management decisions. A negative result may occur with  improper specimen collection/handling, submission of specimen other than nasopharyngeal swab, presence of viral mutation(s) within the areas targeted by this assay, and inadequate number of viral copies (<131 copies/mL). A negative result must be combined with clinical observations, patient history, and epidemiological information. The expected result is Negative. Fact Sheet for Patients:  PinkCheek.be Fact Sheet for Healthcare Providers:  GravelBags.it This test is not yet ap proved or cleared by the [place] FDA and  has been authorized for detection and/or diagnosis of SARS-CoV-2 by FDA under an Emergency Use Authorization (EUA). This EUA will remain  in effect (meaning this test can be used) for the duration of the COVID-19 declaration under Section 564(b)(1) of the Act, 21 U.S.C. section 360bbb-3(b)(1), unless the authorization is terminated  or revoked sooner.    Influenza A by PCR NEGATIVE NEGATIVE Final   Influenza B by PCR NEGATIVE NEGATIVE Final    Comment: (NOTE) The Xpert Xpress SARS-CoV-2/FLU/RSV assay is intended as an aid in  the diagnosis of influenza from Nasopharyngeal swab specimens and  should not be used as a sole basis for treatment. Nasal washings and  aspirates are unacceptable for Xpert Xpress SARS-CoV-2/FLU/RSV  testing. Fact Sheet for Patients: PinkCheek.be Fact Sheet for Healthcare Providers: GravelBags.it This test is not yet approved or cleared by the [place] FDA and  has been authorized for detection and/or diagnosis of SARS-CoV-2 by  FDA under an Emergency Use Authorization (EUA). This EUA will remain  in effect (meaning this test can be used) for the duration of the  Covid-19  declaration under Section 564(b)(1) of the Act, 21  U.S.C. section 360bbb-3(b)(1), unless the authorization is  terminated or revoked. Performed at [facility], [address] [place], [place] [address]   MRSA PCR Screening     Status: Abnormal   Collection Time: 02/25/19  8:08 AM   Specimen: Nasopharyngeal  Result Value Ref Range Status   MRSA by PCR POSITIVE (A) NEGATIVE Final    Comment:        The GeneXpert MRSA Assay (FDA approved for NASAL specimens only), is one component of a comprehensive MRSA colonization surveillance program. It is not intended to diagnose MRSA infection nor to guide or monitor treatment for MRSA infections. RESULT CALLED TO, READ BACK BY AND VERIFIED WITH: Lum Babe RN AT [phone] ON 02/25/19 [facility]  Performed at [facility], [address] [place], [place] [address]      Labs: BNP (last 3 results) Recent Labs    08/17/18 1030  BNP 341.9*   Basic Metabolic Panel: Recent Labs  Lab 02/23/19 1828 02/24/19 0611 02/26/19 0609  NA 138 140 136  K 4.6 4.6 4.4  CL 106 111 104  CO2 21* 21* 21*   GLUCOSE 139* 123* 134*  BUN 37* 37* 45*  CREATININE 1.62* 1.47* 1.76*  CALCIUM 9.4 8.8* 9.0   Liver Function Tests: Recent Labs  Lab 02/23/19 1828  AST 23  ALT 18  ALKPHOS 156*  BILITOT 0.6  PROT 9.3*  ALBUMIN 3.0*   No results for input(s): LIPASE, AMYLASE in the last 168 hours. No results for input(s): AMMONIA in the last 168 hours. CBC: Recent Labs  Lab 02/23/19 1828 02/24/19 0611  WBC 7.7 5.6  HGB 10.8* 9.1*  HCT 35.2* 30.5*  MCV 82.8 83.6  PLT 165 146*   Cardiac Enzymes: Recent Labs  Lab 02/23/19 1828  CKTOTAL 75   BNP: Invalid input(s): POCBNP CBG: Recent Labs  Lab 02/25/19 0752 02/25/19 1150 02/25/19 1655 02/25/19 2108 02/26/19 0746  GLUCAP 99 173* 89 110* 133*   D-Dimer No results for input(s): DDIMER in the last 72 hours. Hgb A1c No results for input(s): HGBA1C in the last 72 hours. Lipid Profile No results for input(s): CHOL, HDL, LDLCALC, TRIG, CHOLHDL, LDLDIRECT in the last 72 hours. Thyroid function studies No results for input(s): TSH, T4TOTAL, T3FREE, THYROIDAB in the last 72 hours.  Invalid input(s): FREET3 Anemia work up No results for input(s): VITAMINB12, FOLATE, FERRITIN, TIBC, IRON, RETICCTPCT in the last 72 hours. Urinalysis    Component Value Date/Time   COLORURINE YELLOW (A) 02/23/2019 1857   APPEARANCEUR CLEAR (A) 02/23/2019 1857   APPEARANCEUR Clear 08/03/2013 2138   LABSPEC 1.015 02/23/2019 1857   LABSPEC 1.028 08/03/2013 2138   PHURINE 5.0 02/23/2019 1857   GLUCOSEU NEGATIVE 02/23/2019 1857   GLUCOSEU Negative 08/03/2013 2138   HGBUR NEGATIVE 02/23/2019 1857   BILIRUBINUR NEGATIVE 02/23/2019 1857   BILIRUBINUR Negative 08/03/2013 2138   KETONESUR NEGATIVE 02/23/2019 1857   PROTEINUR 100 (A) 02/23/2019 1857   UROBILINOGEN 0.2 09/27/2006 2132   NITRITE NEGATIVE 02/23/2019 1857   LEUKOCYTESUR SMALL (A) 02/23/2019 1857   LEUKOCYTESUR Negative 08/03/2013 2138   Sepsis Labs Invalid input(s): PROCALCITONIN,  WBC,   LACTICIDVEN Microbiology Recent Results (from the past 240 hour(s))  Culture, blood (routine x 2)     Status: None (Preliminary result)   Collection Time: 02/23/19  6:28 PM   Specimen: BLOOD  Result Value Ref Range Status   Specimen Description BLOOD LEFT ANTECUBITAL  Final  Special Requests   Final    BOTTLES DRAWN AEROBIC AND ANAEROBIC Blood Culture adequate volume   Culture   Final    NO GROWTH 3 DAYS Performed at [facility], [address] [place], [place] [address]    Report Status PENDING  Incomplete  Urine culture     Status: Abnormal (Preliminary result)   Collection Time: 02/23/19  6:57 PM   Specimen: Urine, Clean Catch  Result Value Ref Range Status   Specimen Description   Final    URINE, CLEAN CATCH Performed at [facility], [address] [place], [place] [address]    Special Requests   Final    NONE Performed at [facility], [address], [place], [place] [address]    Culture (A)  Final    >=100,000 COLONIES/mL ENTEROBACTER SPECIES CULTURE REINCUBATED FOR BETTER GROWTH Performed at [facility], [place] [address], [place], [place] [address]    Report Status PENDING  Incomplete  Respiratory Panel by RT PCR (Flu A&B, Covid) - Nasopharyngeal Swab     Status: None   Collection Time: 02/23/19  8:19 PM   Specimen: Nasopharyngeal Swab  Result Value Ref Range Status   SARS Coronavirus 2 by RT PCR NEGATIVE NEGATIVE Final    Comment: (NOTE) SARS-CoV-2 target nucleic acids are NOT DETECTED. The SARS-CoV-2 RNA is generally detectable in upper respiratoy specimens during the acute phase of infection. The lowest concentration of SARS-CoV-2 viral copies this assay can detect is 131 copies/mL. A negative result does not preclude SARS-Cov-2 infection and should not be used as the sole basis for treatment or other patient management decisions. A negative result may occur with  improper specimen collection/handling, submission of  specimen other than nasopharyngeal swab, presence of viral mutation(s) within the areas targeted by this assay, and inadequate number of viral copies (<131 copies/mL). A negative result must be combined with clinical observations, patient history, and epidemiological information. The expected result is Negative. Fact Sheet for Patients:  PinkCheek.be Fact Sheet for Healthcare Providers:  GravelBags.it This test is not yet ap proved or cleared by the [place] FDA and  has been authorized for detection and/or diagnosis of SARS-CoV-2 by FDA under an Emergency Use Authorization (EUA). This EUA will remain  in effect (meaning this test can be used) for the duration of the COVID-19 declaration under Section 564(b)(1) of the Act, 21 U.S.C. section 360bbb-3(b)(1), unless the authorization is terminated or revoked sooner.    Influenza A by PCR NEGATIVE NEGATIVE Final   Influenza B by PCR NEGATIVE NEGATIVE Final    Comment: (NOTE) The Xpert Xpress SARS-CoV-2/FLU/RSV assay is intended as an aid in  the diagnosis of influenza from Nasopharyngeal swab specimens and  should not be used as a sole basis for treatment. Nasal washings and  aspirates are unacceptable for Xpert Xpress SARS-CoV-2/FLU/RSV  testing. Fact Sheet for Patients: PinkCheek.be Fact Sheet for Healthcare Providers: GravelBags.it This test is not yet approved or cleared by the [place] FDA and  has been authorized for detection and/or diagnosis of SARS-CoV-2 by  FDA under an Emergency Use Authorization (EUA). This EUA will remain  in effect (meaning this test can be used) for the duration of the  Covid-19 declaration under Section 564(b)(1) of the Act, 21  U.S.C. section 360bbb-3(b)(1), unless the authorization is  terminated or revoked. Performed at [facility], [address],  [place],  [address]   MRSA PCR Screening     Status:  Abnormal   Collection Time: 02/25/19  8:08 AM   Specimen: Nasopharyngeal  Result Value Ref Range Status   MRSA by PCR POSITIVE (A) NEGATIVE Final    Comment:        The GeneXpert MRSA Assay (FDA approved for NASAL specimens only), is one component of a comprehensive MRSA colonization surveillance program. It is not intended to diagnose MRSA infection nor to guide or monitor treatment for MRSA infections. RESULT CALLED TO, READ BACK BY AND VERIFIED WITHLum Babe RN AT [phone] ON 02/25/19 [facility]  Performed at [facility], [address] [place], [place] [address]      Time coordinating discharge: 35 minutes  SIGNED:   Louellen Molder, MD  [facility] 02/26/2019, 10:46 AM Pager   If 7PM-7AM, please contact night-coverage [email or web address] Password [email or web address]

## 2019-02-26 NOTE — TOC Initial Note (Signed)
Transition of Care [facility]) - Initial/Assessment Note    Patient Details  Name: [name] MRN: [id number] Date of Birth: Mar 05, 1955  Transition of Care [facility]) CM/SW Contact:    Beverly Sessions, RN Phone Number: 02/26/2019, 3:27 PM  Clinical Narrative:                 Patient admitted from home with UTI Patient states that she lives at home with her husband who is WC bound.   Patient states that family come to check on them every couple days  States that friends/family transport her to her appointments  [facility]-  Denies issues obtaining medications  Patient states that she has a RW in the home  PT has assessed patient and recommends SNF.  Patient declines SNF.  Agreeable to home health services.  States she does not have a preference of home health agency.  Referral made to [place] [facility]   Patient states she will call her husband to set up someone picking her up today  Expected Discharge Plan: [place] Barriers to Discharge: No Barriers Identified   Patient Goals and CMS Choice        Expected Discharge Plan and Services Expected Discharge Plan: [place]       Living arrangements for the past 2 months: Single Family Home Expected Discharge Date: 02/26/19                         HH Arranged: PT, OT, Refused SNF [place] [facility] Date [facility] Agency Contacted: 02/26/19   Representative spoke with at [place]: [place]  Prior Living Arrangements/Services Living arrangements for the past 2 months: [place] with:: Spouse   Do you feel safe going back to the place where you live?: Yes      Need for Family Participation in Patient Care: Yes (Comment) Care giver support system in place?: Yes (comment) Current home services: DME Criminal Activity/Legal Involvement Pertinent to Current Situation/Hospitalization: Yes - Comment as needed  Activities of Daily Living Home Assistive  Devices/Equipment: Environmental consultant (specify type), Wheelchair, Shower chair with back, Raised toilet seat with rails(rolling walker) ADL Screening (condition at time of admission) Patient's cognitive ability adequate to safely complete daily activities?: Yes Is the patient deaf or have difficulty hearing?: No Does the patient have difficulty seeing, even when wearing glasses/contacts?: Yes Does the patient have difficulty concentrating, remembering, or making decisions?: No Patient able to express need for assistance with ADLs?: Yes Does the patient have difficulty dressing or bathing?: Yes Independently performs ADLs?: No Communication: Needs assistance Is this a change from baseline?: Pre-admission baseline Dressing (OT): Needs assistance([place] becoming weaker) Is this a change from baseline?: Pre-admission baseline Grooming: Needs assistance Is this a change from baseline?: Pre-admission baseline Feeding: Independent Bathing: Needs assistance Is this a change from baseline?: Pre-admission baseline Toileting: Needs assistance Is this a change from baseline?: Pre-admission baseline In/Out Bed: Needs assistance Is this a change from baseline?: Pre-admission baseline Walks in Home: Needs assistance Is this a change from baseline?: Pre-admission baseline Does the patient have difficulty walking or climbing stairs?: Yes Weakness of Legs: Both Weakness of Arms/Hands: None  Permission Sought/Granted                  Emotional Assessment Appearance:: Appears stated age Attitude/Demeanor/Rapport: Gracious   Orientation: : Oriented to Self, Oriented to Place, Oriented to  Time, Oriented to  Situation   Psych Involvement: No (comment)  Admission diagnosis:  UTI (urinary tract infection) [N39.0] Fall [W19.XXXA] Generalized weakness [R53.1] Cystitis [N30.90] Fall in home, initial encounter [W19.Merril Abbe, B44.967] Patient Active Problem List   Diagnosis Date Noted  . Fall 02/24/2019  .  UTI (urinary tract infection) 02/23/2019  . AVM (arteriovenous malformation) of small bowel, acquired with hemorrhage   . Epistaxis 11/29/2018  . Altered mental status   . Heme positive stool   . Gastritis and gastroduodenitis   . Encephalopathy acute   . Acute on chronic blood loss anemia 11/20/2018  . Pressure injury of skin 10/26/2018  . Anxiety 08/15/2018  . Chronic back pain 08/15/2018  . GERD (gastroesophageal reflux disease) 08/15/2018  . History of carpal tunnel syndrome 08/15/2018  . HNP (herniated nucleus pulposus), lumbar 08/15/2018  . Hyperlipidemia 08/15/2018  . Hypertension 08/15/2018  . Insomnia 08/15/2018  . Left ventricular hypertrophy 08/15/2018  . Obesity 08/15/2018  . Sleep apnea 08/15/2018  . Severe anemia 06/20/2018  . Diabetic polyneuropathy associated with type 2 diabetes mellitus ([place]) 02/23/2018  . Moderate tricuspid regurgitation 02/18/2018  . Acute on chronic diastolic CHF (congestive heart failure) ([place]) 02/08/2018  . 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 Depression 02/05/2018  . Generalized abdominal pain 02/05/2018  . CHF (congestive heart failure) ([place]) 01/13/2018  . B12 deficiency 12/07/2017  . Iron deficiency anemia due to chronic blood loss 12/07/2017  . Folate deficiency 12/07/2017  . Upper GI bleed 11/03/2017  . COPD (chronic obstructive pulmonary disease) ([place]) 09/26/2017  . Symptomatic anemia 09/26/2017  . Acute on chronic systolic CHF (congestive heart failure) ([place]) 08/27/2017  . Anemia   . Weakness   . Lower GI bleeding 08/07/2017  . Chronic systolic heart failure ([place]) 12/28/2016  . Tobacco use 12/28/2016  . NSTEMI (non-ST elevated myocardial infarction) ([place])   . Sepsis ([place]) 12/18/2016  . CHF exacerbation ([place]) 12/04/2015  . PNA (pneumonia) 10/28/2015  . COPD exacerbation ([place]) 08/29/2015  . Chronic hepatitis C without hepatic coma ([place]) 05/21/2014  . CAD (coronary artery disease) 07/19/2013  . Right arm pain  06/26/2013  . Cervical pain (neck) 03/28/2013  . Radiculopathy affecting upper extremity 03/28/2013  . Impaired renal function 12/21/2012  . Abnormal mammogram 12/20/2012  . Diabetes mellitus type 2, uncontrolled ([place]) 12/20/2012   PCP:  [facility]:   [place], [place], [place] - [address] [place] [address] Phone: [phone] Fax: [place], [place] - [address] [place] [address] Phone: [phone] Fax: [phone]  [id number] [id number]: [phone]: [phone]    Social Determinants of Health (SDOH) Interventions    Readmission Risk Interventions Readmission Risk Prevention Plan 02/26/2019 12/17/2018 10/26/2018  Transportation Screening Complete Complete Complete  PCP or Specialist Appt within 5-7 Days - - -  Home Care Screening - - -  Medication Review (RN CM) - - -  [place]- - Complete  Social Work Consult for [place]anning/Counseling - - Not Complete  SW consult not completed comments - - not recovery care  Palliative Care Screening - - Not Applicable  Medication Review (RN Care Manager) Complete Referral to Pharmacy Referral to Pharmacy  PCP or Specialist appointment within 3-5 days of discharge (No Data) Not Complete -  PCP/Specialist Appt Not Complete comments - DC date unknown but pt is established with providers -  [place] or Home Care Consult Complete Not Complete Complete  HRI or Home Care Consult Pt Refusal Comments - SNF resident -  SW Recovery Care/Counseling Consult - Complete -  Palliative Care Screening Not Applicable Not Complete Not Applicable  Comments - pending need -  [place] Patient Refused Complete -  Some recent data might be hidden

## 2019-02-26 NOTE — TOC Transition Note (Signed)
Transition of Care [facility]) - CM/SW Discharge Note   Patient Details  Name: [name] MRN: [id number] Date of Birth: December 09, 1955  Transition of Care [facility]) CM/SW Contact:  Beverly Sessions, RN Phone Number: 02/26/2019, 3:33 PM   Clinical Narrative:    Notified by bedside RN that patient's husband has called and expressed concerns about patient returning home Husband states that he would like for patient to go to SNF at discharge.  He is aware that she would have to stay for 30 day.    RNCM went to patient's bedside with husband on speaker phone to discuss discharge plan.  She states that since her husband feels she needs to go she will.  Bed offers presented to patient and husband.  They state [facility] would be their first choices  RNCM reached ou to [facility] x3.  Awaiting return call  RNCM [facility].  They would not be able to accept patient till tomorrow and patient would have to have a repeat covid test.  MD and bedside nurse update.  Will await return call from [facility]   Final next level of care: [place] Barriers to Discharge: No Barriers Identified   Patient Goals and CMS Choice        Discharge Placement                       Discharge Plan and Services                          HH Arranged: PT, OT, Refused SNF [facility] Date [facility] Agency Contacted: 02/26/19   Representative spoke with at [place]: [place] Interventions     Readmission Risk Interventions Readmission Risk Prevention Plan 02/26/2019 12/17/2018 10/26/2018  Transportation Screening Complete Complete Complete  PCP or Specialist Appt within 5-7 Days - - -  Home Care Screening - - -  Medication Review (RN CM) - - -  [place] or [place] - - Complete  Social Work Consult for [place] Planning/Counseling - - Not Complete  SW consult not completed comments - - not recovery care  Palliative  Care Screening - - Not Applicable  Medication Review (RN Care Manager) Complete Referral to Pharmacy Referral to Pharmacy  PCP or Specialist appointment within 3-5 days of discharge (No Data) Not Complete -  PCP/Specialist Appt Not Complete comments - DC date unknown but pt is established with providers -  [place] or Home Care Consult Complete Not Complete Complete  HRI or Home Care Consult Pt Refusal Comments - SNF resident -  SW Recovery Care/Counseling Consult - Complete -  Palliative Care Screening Not Applicable Not Complete Not Applicable  Comments - pending need -  [place] Patient Refused Complete -  Some recent data might be hidden

## 2019-02-27 LAB — SARS CORONAVIRUS 2 (TAT 6-24 HRS): SARS Coronavirus 2: NEGATIVE

## 2019-02-27 LAB — GLUCOSE, CAPILLARY
Glucose-Capillary: 149 mg/dL — ABNORMAL HIGH (ref 70–99)
Glucose-Capillary: 168 mg/dL — ABNORMAL HIGH (ref 70–99)

## 2019-02-27 NOTE — Plan of Care (Signed)
Patient discharged to [facility] via EMS. Report called to [place].  Patient husband notified and verbalized understanding and appreciation. PIV removed with tip intact. External cath removed prior to discharge.  EMS transported.  Patient discharge instructions reviewed with patient and sent to facility via EMS. No questions or concerns noted.

## 2019-02-27 NOTE — TOC Transition Note (Signed)
Transition of Care [facility]) - CM/SW Discharge Note   Patient Details  Name: [name] MRN: [id number] Date of Birth: [birth year]  Transition of Care [facility]) CM/SW Contact:  Beverly Sessions, RN Phone Number: 02/27/2019, 9:07 AM   Clinical Narrative:    Attempted to reach [facility] to [facility] additional [facility] times.  Messages left for Johna Roles, and Levada Dy at [facility] reached out to husband to notify him that I am unable to confirm that [facility] has a bed available.   [facility] is able to accept patient today.  Husband has accepted bed, wife in agreement.   Doug at [facility] notified.  DC information sent in the [place].    Department leadership notified    Final next level of care: Skilled Nursing Facility Barriers to Discharge: No Barriers Identified   Patient Goals and CMS Choice        Discharge Placement              Patient chooses bed at: [facility] Patient to be transferred to facility by: EMS Name of family member notified: Husband Patient and family notified of of transfer: 02/27/19  Discharge Plan and Services                          HH Arranged: PT, OT, Refused SNF [facility] Date [facility] Agency Contacted: 02/26/19   Representative spoke with at [place]: [place] Interventions     Readmission Risk Interventions Readmission Risk Prevention Plan 02/26/2019 12/17/2018 10/26/2018  Transportation Screening Complete Complete Complete  PCP or Specialist Appt within 5-7 Days - - -  Home Care Screening - - -  Medication Review (RN CM) - - -  [place] or [place] - - Complete  Social Work Consult for [place] Planning/Counseling - - Not Complete  SW consult not completed comments - - not recovery care  Palliative Care Screening - - Not Applicable  Medication Review Press photographer) Complete Referral to Pharmacy Referral to Pharmacy  PCP or Specialist appointment within  3-5 days of discharge (No Data) Not Complete -  PCP/Specialist Appt Not Complete comments - DC date unknown but pt is established with providers -  [place] or Home Care Consult Complete Not Complete Complete  HRI or Home Care Consult Pt Refusal Comments - SNF resident -  SW Recovery Care/Counseling Consult - Complete -  Palliative Care Screening Not Applicable Not Complete Not Applicable  Comments - pending need -  [place] Patient Refused Complete -  Some recent data might be hidden

## 2019-02-27 NOTE — Discharge Summary (Signed)
Admit Date: 02/23/19 D/C date 02/27/19  Please refer to d/c summary on 02/26/19 for full d/c summary

## 2019-02-28 LAB — URINE CULTURE: Culture: >=100000 — AB

## 2019-02-28 LAB — CULTURE, BLOOD (ROUTINE X 2)
Culture: NO GROWTH
Special Requests: ADEQUATE

## 2019-05-04 ENCOUNTER — Emergency Department: Payer: Medicaid Other

## 2019-05-04 ENCOUNTER — Inpatient Hospital Stay: Payer: Medicaid Other

## 2019-05-04 ENCOUNTER — Encounter: Payer: Self-pay | Admitting: Emergency Medicine

## 2019-05-04 ENCOUNTER — Inpatient Hospital Stay
Admission: EM | Admit: 2019-05-04 | Discharge: 2019-05-10 | DRG: 812 | Disposition: A | Discharge status: Home Health | Payer: Medicaid Other | Attending: Internal Medicine | Admitting: Internal Medicine

## 2019-05-04 DIAGNOSIS — D509 Iron deficiency anemia, unspecified: Secondary | ICD-10-CM | POA: Diagnosis present

## 2019-05-04 DIAGNOSIS — I130 Hypertensive heart and chronic kidney disease with heart failure and stage 1 through stage 4 chronic kidney disease, or unspecified chronic kidney disease: Secondary | ICD-10-CM | POA: Diagnosis present

## 2019-05-04 DIAGNOSIS — M79606 Pain in leg, unspecified: Secondary | ICD-10-CM | POA: Diagnosis present

## 2019-05-04 DIAGNOSIS — K7469 Other cirrhosis of liver: Secondary | ICD-10-CM | POA: Diagnosis not present

## 2019-05-04 DIAGNOSIS — K7460 Unspecified cirrhosis of liver: Secondary | ICD-10-CM | POA: Diagnosis present

## 2019-05-04 DIAGNOSIS — I509 Heart failure, unspecified: Secondary | ICD-10-CM

## 2019-05-04 DIAGNOSIS — I5082 Biventricular heart failure: Secondary | ICD-10-CM | POA: Diagnosis present

## 2019-05-04 DIAGNOSIS — R0602 Shortness of breath: Secondary | ICD-10-CM

## 2019-05-04 DIAGNOSIS — G629 Polyneuropathy, unspecified: Secondary | ICD-10-CM | POA: Diagnosis present

## 2019-05-04 DIAGNOSIS — L89220 Pressure ulcer of left hip, unstageable: Secondary | ICD-10-CM | POA: Diagnosis present

## 2019-05-04 DIAGNOSIS — Z20822 Contact with and (suspected) exposure to covid-19: Secondary | ICD-10-CM | POA: Diagnosis present

## 2019-05-04 DIAGNOSIS — R768 Other specified abnormal immunological findings in serum: Secondary | ICD-10-CM | POA: Diagnosis not present

## 2019-05-04 DIAGNOSIS — D684 Acquired coagulation factor deficiency: Secondary | ICD-10-CM | POA: Diagnosis present

## 2019-05-04 DIAGNOSIS — R188 Other ascites: Secondary | ICD-10-CM | POA: Diagnosis present

## 2019-05-04 DIAGNOSIS — I5022 Chronic systolic (congestive) heart failure: Secondary | ICD-10-CM | POA: Diagnosis present

## 2019-05-04 DIAGNOSIS — E875 Hyperkalemia: Secondary | ICD-10-CM | POA: Diagnosis not present

## 2019-05-04 DIAGNOSIS — J302 Other seasonal allergic rhinitis: Secondary | ICD-10-CM | POA: Diagnosis present

## 2019-05-04 DIAGNOSIS — Z888 Allergy status to other drugs, medicaments and biological substances status: Secondary | ICD-10-CM

## 2019-05-04 DIAGNOSIS — N1832 Chronic kidney disease, stage 3b: Secondary | ICD-10-CM | POA: Diagnosis present

## 2019-05-04 DIAGNOSIS — Z89412 Acquired absence of left great toe: Secondary | ICD-10-CM

## 2019-05-04 DIAGNOSIS — E1122 Type 2 diabetes mellitus with diabetic chronic kidney disease: Secondary | ICD-10-CM | POA: Diagnosis present

## 2019-05-04 DIAGNOSIS — E561 Deficiency of vitamin K: Secondary | ICD-10-CM | POA: Diagnosis not present

## 2019-05-04 DIAGNOSIS — F17210 Nicotine dependence, cigarettes, uncomplicated: Secondary | ICD-10-CM | POA: Diagnosis present

## 2019-05-04 DIAGNOSIS — Z803 Family history of malignant neoplasm of breast: Secondary | ICD-10-CM

## 2019-05-04 DIAGNOSIS — I340 Nonrheumatic mitral (valve) insufficiency: Secondary | ICD-10-CM

## 2019-05-04 DIAGNOSIS — E785 Hyperlipidemia, unspecified: Secondary | ICD-10-CM | POA: Diagnosis present

## 2019-05-04 DIAGNOSIS — Z6836 Body mass index (BMI) 36.0-36.9, adult: Secondary | ICD-10-CM | POA: Diagnosis not present

## 2019-05-04 DIAGNOSIS — D649 Anemia, unspecified: Secondary | ICD-10-CM | POA: Diagnosis present

## 2019-05-04 DIAGNOSIS — I252 Old myocardial infarction: Secondary | ICD-10-CM | POA: Diagnosis not present

## 2019-05-04 DIAGNOSIS — Z8541 Personal history of malignant neoplasm of cervix uteri: Secondary | ICD-10-CM

## 2019-05-04 DIAGNOSIS — R0982 Postnasal drip: Secondary | ICD-10-CM | POA: Diagnosis present

## 2019-05-04 DIAGNOSIS — I071 Rheumatic tricuspid insufficiency: Secondary | ICD-10-CM | POA: Diagnosis not present

## 2019-05-04 DIAGNOSIS — R7689 Other specified abnormal immunological findings in serum: Secondary | ICD-10-CM

## 2019-05-04 DIAGNOSIS — Z8249 Family history of ischemic heart disease and other diseases of the circulatory system: Secondary | ICD-10-CM

## 2019-05-04 DIAGNOSIS — Z8711 Personal history of peptic ulcer disease: Secondary | ICD-10-CM

## 2019-05-04 DIAGNOSIS — Z951 Presence of aortocoronary bypass graft: Secondary | ICD-10-CM

## 2019-05-04 DIAGNOSIS — I350 Nonrheumatic aortic (valve) stenosis: Secondary | ICD-10-CM | POA: Diagnosis not present

## 2019-05-04 DIAGNOSIS — Z91040 Latex allergy status: Secondary | ICD-10-CM

## 2019-05-04 DIAGNOSIS — Z833 Family history of diabetes mellitus: Secondary | ICD-10-CM

## 2019-05-04 DIAGNOSIS — J449 Chronic obstructive pulmonary disease, unspecified: Secondary | ICD-10-CM | POA: Diagnosis present

## 2019-05-04 DIAGNOSIS — Z79899 Other long term (current) drug therapy: Secondary | ICD-10-CM

## 2019-05-04 DIAGNOSIS — N183 Chronic kidney disease, stage 3 unspecified: Secondary | ICD-10-CM

## 2019-05-04 DIAGNOSIS — E669 Obesity, unspecified: Secondary | ICD-10-CM | POA: Diagnosis present

## 2019-05-04 DIAGNOSIS — I361 Nonrheumatic tricuspid (valve) insufficiency: Secondary | ICD-10-CM | POA: Diagnosis not present

## 2019-05-04 DIAGNOSIS — B182 Chronic viral hepatitis C: Secondary | ICD-10-CM | POA: Diagnosis present

## 2019-05-04 LAB — CBC
HCT: 23.4 % — ABNORMAL LOW (ref 36.0–46.0)
HCT: 21.6 % — ABNORMAL LOW (ref 36.0–46.0)
Hemoglobin: 6.4 g/dL — ABNORMAL LOW (ref 12.0–15.0)
Hemoglobin: 5.7 g/dL — ABNORMAL LOW (ref 12.0–15.0)
MCH: 20.9 pg — ABNORMAL LOW (ref 26.0–34.0)
MCH: 20.6 pg — ABNORMAL LOW (ref 26.0–34.0)
MCHC: 27.4 g/dL — ABNORMAL LOW (ref 30.0–36.0)
MCHC: 26.4 g/dL — ABNORMAL LOW (ref 30.0–36.0)
MCV: 76.5 fL — ABNORMAL LOW (ref 80.0–100.0)
MCV: 78 fL — ABNORMAL LOW (ref 80.0–100.0)
Platelets: 317 10*3/uL (ref 150–400)
Platelets: 296 10*3/uL (ref 150–400)
RBC: 3.06 MIL/uL — ABNORMAL LOW (ref 3.87–5.11)
RBC: 2.77 MIL/uL — ABNORMAL LOW (ref 3.87–5.11)
RDW: 21.1 % — ABNORMAL HIGH (ref 11.5–15.5)
RDW: 20.7 % — ABNORMAL HIGH (ref 11.5–15.5)
WBC: 7.2 10*3/uL (ref 4.0–10.5)
WBC: 6.9 10*3/uL (ref 4.0–10.5)
nRBC: 0 % (ref 0.0–0.2)
nRBC: 0 % (ref 0.0–0.2)

## 2019-05-04 LAB — RESPIRATORY PANEL BY RT PCR (FLU A&B, COVID)
Influenza A by PCR: NEGATIVE
Influenza B by PCR: NEGATIVE
SARS Coronavirus 2 by RT PCR: NEGATIVE

## 2019-05-04 LAB — URINALYSIS, ROUTINE W REFLEX MICROSCOPIC
Bilirubin Urine: NEGATIVE
Glucose, UA: NEGATIVE mg/dL
Ketones, ur: NEGATIVE mg/dL
Nitrite: POSITIVE — AB
Protein, ur: 30 mg/dL — AB
Specific Gravity, Urine: 1.01 (ref 1.005–1.030)
pH: 5 (ref 5.0–8.0)

## 2019-05-04 LAB — COMPREHENSIVE METABOLIC PANEL
ALT: 9 U/L (ref 0–44)
AST: 16 U/L (ref 15–41)
Albumin: 3 g/dL — ABNORMAL LOW (ref 3.5–5.0)
Alkaline Phosphatase: 124 U/L (ref 38–126)
Anion gap: 8 (ref 5–15)
BUN: 22 mg/dL (ref 8–23)
CO2: 20 mmol/L — ABNORMAL LOW (ref 22–32)
Calcium: 8.6 mg/dL — ABNORMAL LOW (ref 8.9–10.3)
Chloride: 109 mmol/L (ref 98–111)
Creatinine, Ser: 1.66 mg/dL — ABNORMAL HIGH (ref 0.44–1.00)
GFR calc Af Amer: 37 mL/min — ABNORMAL LOW (ref >60)
GFR calc non Af Amer: 32 mL/min — ABNORMAL LOW (ref >60)
Glucose, Bld: 167 mg/dL — ABNORMAL HIGH (ref 70–99)
Potassium: 4.8 mmol/L (ref 3.5–5.1)
Sodium: 137 mmol/L (ref 135–145)
Total Bilirubin: 1 mg/dL (ref 0.3–1.2)
Total Protein: 8.9 g/dL — ABNORMAL HIGH (ref 6.5–8.1)

## 2019-05-04 LAB — VITAMIN B12: Vitamin B-12: 604 pg/mL (ref 180–914)

## 2019-05-04 LAB — PROTIME-INR
INR: 1.4 — ABNORMAL HIGH (ref 0.8–1.2)
Prothrombin Time: 16.6 seconds — ABNORMAL HIGH (ref 11.4–15.2)

## 2019-05-04 LAB — BRAIN NATRIURETIC PEPTIDE: B Natriuretic Peptide: 778 pg/mL — ABNORMAL HIGH (ref 0.0–100.0)

## 2019-05-04 LAB — FERRITIN: Ferritin: 24 ng/mL (ref 11–307)

## 2019-05-04 LAB — IRON AND TIBC
Iron: 11 ug/dL — ABNORMAL LOW (ref 28–170)
Saturation Ratios: 4 % — ABNORMAL LOW (ref 10.4–31.8)
TIBC: 298 ug/dL (ref 250–450)
UIBC: 287 ug/dL

## 2019-05-04 LAB — RETICULOCYTES
Immature Retic Fract: 25.4 % — ABNORMAL HIGH (ref 2.3–15.9)
RBC.: 2.82 MIL/uL — ABNORMAL LOW (ref 3.87–5.11)
Retic Count, Absolute: 80.4 10*3/uL (ref 19.0–186.0)
Retic Ct Pct: 2.9 % (ref 0.4–3.1)

## 2019-05-04 LAB — PREPARE RBC (CROSSMATCH)

## 2019-05-04 LAB — CREATININE, SERUM
Creatinine, Ser: 1.81 mg/dL — ABNORMAL HIGH (ref 0.44–1.00)
GFR calc Af Amer: 34 mL/min — ABNORMAL LOW (ref >60)
GFR calc non Af Amer: 29 mL/min — ABNORMAL LOW (ref >60)

## 2019-05-04 LAB — FOLATE: Folate: 28 ng/mL (ref >5.9)

## 2019-05-04 LAB — TROPONIN I (HIGH SENSITIVITY)
Troponin I (High Sensitivity): 17 ng/L (ref <18)
Troponin I (High Sensitivity): 17 ng/L (ref <18)

## 2019-05-04 LAB — APTT: aPTT: 34 seconds (ref 24–36)

## 2019-05-04 LAB — TSH: TSH: 2.089 u[IU]/mL (ref 0.350–4.500)

## 2019-05-04 MED ORDER — CARVEDILOL 3.125 MG PO TABS
3.1250 mg | ORAL_TABLET | Freq: Two times a day (BID) | ORAL | Status: DC
  Administered 2019-05-04 – 2019-05-10 (×8): 3.125 mg via ORAL
  Filled 2019-05-04 (×11): qty 1

## 2019-05-04 MED ORDER — FUROSEMIDE 10 MG/ML IJ SOLN
60.0000 mg | Freq: Four times a day (QID) | INTRAMUSCULAR | Status: DC
  Administered 2019-05-05: 60 mg via INTRAVENOUS
  Filled 2019-05-04: qty 8

## 2019-05-04 MED ORDER — MORPHINE SULFATE (PF) 4 MG/ML IV SOLN
INTRAVENOUS | Status: AC
  Filled 2019-05-04: qty 1

## 2019-05-04 MED ORDER — PHYTONADIONE 5 MG PO TABS
10.0000 mg | ORAL_TABLET | Freq: Every day | ORAL | Status: AC
  Administered 2019-05-05 – 2019-05-06 (×2): 10 mg via ORAL
  Filled 2019-05-04 (×2): qty 2

## 2019-05-04 MED ORDER — SODIUM CHLORIDE 0.9% FLUSH: 3.0000 mL | INTRAVENOUS | Status: DC | PRN

## 2019-05-04 MED ORDER — FUROSEMIDE 10 MG/ML IJ SOLN
60.0000 mg | Freq: Once | INTRAMUSCULAR | Status: AC
  Administered 2019-05-04: 60 mg via INTRAVENOUS
  Filled 2019-05-04: qty 8

## 2019-05-04 MED ORDER — ENOXAPARIN SODIUM 40 MG/0.4ML ~~LOC~~ SOLN
40.0000 mg | SUBCUTANEOUS | Status: DC
  Administered 2019-05-05 – 2019-05-09 (×5): 40 mg via SUBCUTANEOUS
  Filled 2019-05-04 (×5): qty 0.4

## 2019-05-04 MED ORDER — ACETAMINOPHEN 325 MG PO TABS
650.0000 mg | ORAL_TABLET | ORAL | Status: DC | PRN
  Administered 2019-05-06 – 2019-05-07 (×2): 650 mg via ORAL
  Filled 2019-05-04 (×3): qty 2

## 2019-05-04 MED ORDER — SODIUM CHLORIDE 0.9 % IV SOLN
10.0000 mL/h | Freq: Once | INTRAVENOUS | Status: AC
  Administered 2019-05-04: 10 mL/h via INTRAVENOUS

## 2019-05-04 MED ORDER — ONDANSETRON HCL 4 MG/2ML IJ SOLN: 4.0000 mg | Freq: Four times a day (QID) | INTRAMUSCULAR | Status: DC | PRN

## 2019-05-04 MED ORDER — MORPHINE SULFATE (PF) 2 MG/ML IV SOLN
2.0000 mg | INTRAVENOUS | Status: DC | PRN
  Administered 2019-05-05: 2 mg via INTRAVENOUS
  Filled 2019-05-04: qty 1

## 2019-05-04 MED ORDER — FLUTICASONE PROPIONATE 50 MCG/ACT NA SUSP
2.0000 | Freq: Every day | NASAL | Status: DC
  Administered 2019-05-05 – 2019-05-10 (×6): 2 via NASAL
  Filled 2019-05-04: qty 16

## 2019-05-04 MED ORDER — MORPHINE SULFATE (PF) 4 MG/ML IV SOLN
4.0000 mg | Freq: Once | INTRAVENOUS | Status: AC
  Administered 2019-05-04: 4 mg via INTRAVENOUS

## 2019-05-04 MED ORDER — SODIUM CHLORIDE 0.9% FLUSH
3.0000 mL | Freq: Two times a day (BID) | INTRAVENOUS | Status: DC
  Administered 2019-05-04 – 2019-05-10 (×13): 3 mL via INTRAVENOUS

## 2019-05-04 MED ORDER — SODIUM CHLORIDE 0.9 % IV SOLN: 250.0000 mL | INTRAVENOUS | Status: DC | PRN

## 2019-05-04 MED ORDER — PREGABALIN 75 MG PO CAPS
150.0000 mg | ORAL_CAPSULE | Freq: Two times a day (BID) | ORAL | Status: DC
  Administered 2019-05-05 – 2019-05-07 (×5): 150 mg via ORAL
  Filled 2019-05-04 (×5): qty 2

## 2019-05-04 MED ORDER — ATORVASTATIN CALCIUM 20 MG PO TABS
40.0000 mg | ORAL_TABLET | Freq: Every day | ORAL | Status: DC
  Administered 2019-05-04 – 2019-05-10 (×7): 40 mg via ORAL
  Filled 2019-05-04 (×8): qty 2

## 2019-05-04 NOTE — ED Provider Notes (Addendum)
[facility] Emergency Department Provider Note  Time seen: 11:32 AM  I have reviewed the triage vital signs and the nursing notes.   HISTORY  Chief Complaint Shortness of Breath   HPI [name] is a 64 y.o. female with a past medical history of CHF, COPD, diabetes, presents to the emergency department for shortness of breath.  According to the patient for the past week or so she has been experiencing progressively worsening shortness of breath.  Patient also states she has noticed her lower extremities have increase in swelling.  Patient also states mild headache today as well.  Denies any chest pain.   Currently satting 99% on room air.  No baseline O2 requirement.  Past Medical History:  Diagnosis Date  . Cervical cancer ([place])   . CHF (congestive heart failure) ([place])   . Chronic anemia   . COPD (chronic obstructive pulmonary disease) ([place])   . Diabetes mellitus without complication ([place])   . Gastric ulcer   . NSTEMI (non-ST elevated myocardial infarction) ([place])   . Upper GI bleed     Patient Active Problem List   Diagnosis Date Noted  . Fall 02/24/2019  . UTI (urinary tract infection) 02/23/2019  . AVM (arteriovenous malformation) of small bowel, acquired with hemorrhage   . Epistaxis 11/29/2018  . Altered mental status   . Heme positive stool   . Gastritis and gastroduodenitis   . Encephalopathy acute   . Acute on chronic blood loss anemia 11/20/2018  . Pressure injury of skin 10/26/2018  . Anxiety 08/15/2018  . Chronic back pain 08/15/2018  . GERD (gastroesophageal reflux disease) 08/15/2018  . History of carpal tunnel syndrome 08/15/2018  . HNP (herniated nucleus pulposus), lumbar 08/15/2018  . Hyperlipidemia 08/15/2018  . Hypertension 08/15/2018  . Insomnia 08/15/2018  . Left ventricular hypertrophy 08/15/2018  . Obesity 08/15/2018  . Sleep apnea 08/15/2018  . Severe anemia 06/20/2018  . Diabetic polyneuropathy associated with  type 2 diabetes mellitus ([place]) 02/23/2018  . Moderate tricuspid regurgitation 02/18/2018  . Acute on chronic diastolic CHF (congestive heart failure) ([place]) 02/08/2018  . 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 Depression 02/05/2018  . Generalized abdominal pain 02/05/2018  . CHF (congestive heart failure) ([place]) 01/13/2018  . B12 deficiency 12/07/2017  . Iron deficiency anemia due to chronic blood loss 12/07/2017  . Folate deficiency 12/07/2017  . Upper GI bleed 11/03/2017  . COPD (chronic obstructive pulmonary disease) ([place]) 09/26/2017  . Symptomatic anemia 09/26/2017  . Acute on chronic systolic CHF (congestive heart failure) ([place]) 08/27/2017  . Anemia   . Weakness   . Lower GI bleeding 08/07/2017  . Chronic systolic heart failure ([place]) 12/28/2016  . Tobacco use 12/28/2016  . NSTEMI (non-ST elevated myocardial infarction) ([place])   . Sepsis ([place]) 12/18/2016  . CHF exacerbation ([place]) 12/04/2015  . PNA (pneumonia) 10/28/2015  . COPD exacerbation ([place]) 08/29/2015  . Chronic hepatitis C without hepatic coma ([place]) 05/21/2014  . CAD (coronary artery disease) 07/19/2013  . Right arm pain 06/26/2013  . Cervical pain (neck) 03/28/2013  . Radiculopathy affecting upper extremity 03/28/2013  . Impaired renal function 12/21/2012  . Abnormal mammogram 12/20/2012  . Diabetes mellitus type 2, uncontrolled ([place]) 12/20/2012    Past Surgical History:  Procedure Laterality Date  . AMPUTATION Left 06/24/2018   Procedure: AMPUTATION GREAT TOE;  Surgeon: Samara Deist, DPM;  Location: [facility]S;  Service: Podiatry;  Laterality: Left;  . APPENDECTOMY    . BIOPSY  11/22/2018  Procedure: BIOPSY;  Surgeon: Thornton Park, MD;  Location: [facility];  Service: Gastroenterology;;  . CARDIAC CATHETERIZATION    . CHOLECYSTECTOMY    . COLONOSCOPY N/A 09/28/2017   Procedure: COLONOSCOPY;  Surgeon: Toledo, Benay Pike, MD;  Location: [facility] ENDOSCOPY;  Service: Gastroenterology;  Laterality: N/A;  .  COLONOSCOPY WITH PROPOFOL N/A 08/21/2018   Procedure: COLONOSCOPY WITH PROPOFOL;  Surgeon: Lin Landsman, MD;  Location: [facility] ENDOSCOPY;  Service: Gastroenterology;  Laterality: N/A;  . COLONOSCOPY WITH PROPOFOL N/A 12/28/2018   Procedure: COLONOSCOPY WITH PROPOFOL;  Surgeon: Rush Landmark Telford Nab., MD;  Location: [facility] ENDOSCOPY;  Service: Gastroenterology;  Laterality: N/A;  . ENTEROSCOPY Left 11/06/2017   Procedure: ENTEROSCOPY;  Surgeon: Jonathon Bellows, MD;  Location[facility] ENDOSCOPY;  Service: Gastroenterology;  Laterality: Left;  . ENTEROSCOPY N/A 06/22/2018   Procedure: ENTEROSCOPY;  Surgeon: Lin Landsman, MD;  Location: [facility] ENDOSCOPY;  Service: Gastroenterology;  Laterality: N/A;  . ENTEROSCOPY N/A 08/18/2018   Procedure: ENTEROSCOPY;  Surgeon: Jonathon Bellows, MD;  Location: [facility] ENDOSCOPY;  Service: Gastroenterology;  Laterality: N/A;  . ENTEROSCOPY N/A 11/01/2018   Procedure: ENTEROSCOPY;  Surgeon: Virgel Manifold, MD;  Location: [facility] ENDOSCOPY;  Service: Endoscopy;  Laterality: N/A;  . ENTEROSCOPY N/A 11/22/2018   Procedure: ENTEROSCOPY;  Surgeon: Thornton Park, MD;  Location: [facility];  Service: Gastroenterology;  Laterality: N/A;  . ENTEROSCOPY Left 12/17/2018   Procedure: ENTEROSCOPY;  Surgeon: Yetta Flock, MD;  Location: [facility] ENDOSCOPY;  Service: Gastroenterology;  Laterality: Left;  . ENTEROSCOPY N/A 12/19/2018   Procedure: ENTEROSCOPY;  Surgeon: Yetta Flock, MD;  Location: [facility] ENDOSCOPY;  Service: Gastroenterology;  Laterality: N/A;  . ENTEROSCOPY N/A 12/20/2018   Procedure: ENTEROSCOPY;  Surgeon: Lavena Bullion, DO;  Location: [facility] ENDOSCOPY;  Service: Gastroenterology;  Laterality: N/A;  . ESOPHAGOGASTRODUODENOSCOPY N/A 12/19/2016   Procedure: ESOPHAGOGASTRODUODENOSCOPY (EGD);  Surgeon: Lin Landsman, MD;  Location: [facility] ENDOSCOPY;  Service: Gastroenterology;  Laterality: N/A;  . ESOPHAGOGASTRODUODENOSCOPY N/A 09/28/2017   Procedure:  ESOPHAGOGASTRODUODENOSCOPY (EGD);  Surgeon: Toledo, Benay Pike, MD;  Location: [facility] ENDOSCOPY;  Service: Gastroenterology;  Laterality: N/A;  . ESOPHAGOGASTRODUODENOSCOPY (EGD) WITH PROPOFOL N/A 08/08/2017   Procedure: ESOPHAGOGASTRODUODENOSCOPY (EGD) WITH PROPOFOL;  Surgeon: [name], MD;  Location: [facility] ENDOSCOPY;  Service: Endoscopy;  Laterality: N/A;  . GIVENS CAPSULE STUDY N/A 10/28/2018   Procedure: GIVENS CAPSULE STUDY;  Surgeon: Jonathon Bellows, MD;  Location: [facility] ENDOSCOPY;  Service: Gastroenterology;  Laterality: N/A;  . GIVENS CAPSULE STUDY N/A 12/26/2018   Procedure: GIVENS CAPSULE STUDY;  Surgeon: Rush Landmark Telford Nab., MD;  Location: [facility] ENDOSCOPY;  Service: Gastroenterology;  Laterality: N/A;  . [place]    . HEMOSTASIS CLIP PLACEMENT  12/19/2018   Procedure: HEMOSTASIS CLIP PLACEMENT;  Surgeon: Yetta Flock, MD;  Location: [facility];  Service: Gastroenterology;;  . HEMOSTASIS CLIP PLACEMENT  12/28/2018   Procedure: HEMOSTASIS CLIP PLACEMENT;  Surgeon: Irving Copas., MD;  Location: [facility] ENDOSCOPY;  Service: Gastroenterology;;  . HOT HEMOSTASIS N/A 12/17/2018   Procedure: HOT HEMOSTASIS (ARGON PLASMA COAGULATION/BICAP);  Surgeon: Yetta Flock, MD;  Location: [facility];  Service: Gastroenterology;  Laterality: N/A;  . HOT HEMOSTASIS N/A 12/28/2018   Procedure: HOT HEMOSTASIS (ARGON PLASMA COAGULATION/BICAP);  Surgeon: Irving Copas., MD;  Location: [facility];  Service: Gastroenterology;  Laterality: N/A;  . LEFT HEART CATH AND CORONARY ANGIOGRAPHY N/A 12/23/2016   Procedure: LEFT HEART CATH AND CORONARY ANGIOGRAPHY;  Surgeon: Teodoro Spray, MD;  Location: [place] CV LAB;  Service:  Cardiovascular;  Laterality: N/A;  . LOWER EXTREMITY ANGIOGRAPHY Left 06/26/2018   Procedure: Lower Extremity Angiography;  Surgeon: Katha Cabal, MD;  Location: [place] CV LAB;  Service: Cardiovascular;  Laterality: Left;  .  SUBMUCOSAL INJECTION  12/19/2018   Procedure: SUBMUCOSAL INJECTION;  Surgeon: Yetta Flock, MD;  Location: [facility];  Service: Gastroenterology;;  . Lia Foyer TATTOO INJECTION  12/17/2018   Procedure: SUBMUCOSAL TATTOO INJECTION;  Surgeon: Yetta Flock, MD;  Location: [facility] END[facility]COPY;  Service: Gastroenterology;;  . Lia Foyer TATTOO INJECTION  12/28/2018   Procedure: SUBMUCOSAL TATTOO INJECTION;  Surgeon: Irving Copas., MD;  Location: [facility];  Service: Gastroenterology;;  . TEE WITHOUT CARDIOVERSION N/A 10/29/2018   Procedure: TRANSESOPHAGEAL ECHOCARDIOGRAM (TEE);  Surgeon: Teodoro Spray, MD;  Location: [facility];  Service: Cardiovascular;  Laterality: N/A;    Prior to Admission medications   Medication Sig Start Date End Date Taking? Authorizing Provider  Amino Acids-Protein Hydrolys (FEEDING SUPPLEMENT, PRO-STAT SUGAR FREE 64,) LIQD Take 30 mLs by mouth 2 (two) times daily. 12/07/18   Al Decant, MD  ammonium lactate (AMLACTIN) 12 % cream Apply 1 g topically 2 (two) times daily.    [provider]  atorvastatin (LIPITOR) 40 MG tablet Take 40 mg by mouth daily.    [provider]  carvedilol (COREG) 3.125 MG tablet Take 1 tablet (3.125 mg total) by mouth 2 (two) times daily with a meal. 08/21/18   Ojie, Jude, MD  fluticasone (FLONASE) 50 MCG/ACT nasal spray Place 2 sprays into both nostrils daily.    [provider]  folic acid (FOLVITE) 1 MG tablet Take 1 tablet (1 mg total) by mouth daily. 08/21/18   Stark Jock Jude, MD  lactulose (CHRONULAC) 10 GM/15ML solution Take 30 mLs (20 g total) by mouth 3 (three) times daily. 12/07/18   Al Decant, MD  mirtazapine (REMERON SOL-TAB) 30 MG disintegrating tablet Take 1 tablet (30 mg total) by mouth at bedtime. 12/07/18   Al Decant, MD  Multiple Vitamin (MULTIVITAMIN WITH MINERALS) TABS tablet Take 1 tablet by mouth daily. 06/28/18   Lang Snow, NP  Nutritional Supplements  (RESOURCE 2.0 PO) Take 1 Bottle by mouth 2 (two) times daily.    [provider]  pantoprazole (PROTONIX) 40 MG tablet Take 1 tablet (40 mg total) by mouth 2 (two) times daily. 12/24/18   Shelly Coss, MD  pregabalin (LYRICA) 150 MG capsule Take 150 mg by mouth 2 (two) times daily. 02/18/19   [provider]  senna-docusate (SENOKOT-S) 8.6-50 MG tablet Take 1 tablet by mouth at bedtime as needed for mild constipation. 01/01/19   Sheth, Vickii Chafe, MD  spironolactone (ALDACTONE) 25 MG tablet Take 12.5 mg by mouth daily.    [provider]  torsemide (DEMADEX) 20 MG tablet Take 20 mg by mouth daily. Take 2 tablets if weight is greater than 185 lbs. NO tablets if weight is less than 177 lbs.    [provider]    Allergies  Allergen Reactions  . Latex Anaphylaxis and Rash  . Gabapentin Nausea And Vomiting and Other (See Comments)    Family History  Problem Relation Age of Onset  . Hypertension Mother   . Diabetes Mellitus II Mother   . Breast cancer Mother   . Heart disease Father   . Cancer Sister     Social History Social History   Tobacco Use  . Smoking status: Current Some Day Smoker    Packs/day: 0.25    Types:  Cigarettes  . Smokeless tobacco: Never Used  Substance Use Topics  . Alcohol use: No  . Drug use: No    Review of Systems Constitutional: Negative for fever. Cardiovascular: Negative for chest pain. Respiratory: Negative for shortness of breath. Gastrointestinal: Negative for abdominal pain Musculoskeletal: Negative for musculoskeletal complaints Neurological: Negative for headache All other ROS negative  ____________________________________________   PHYSICAL EXAM:  VITAL SIGNS: ED Triage Vitals  Enc Vitals Group     BP 05/04/19 1130 132/75     Pulse Rate 05/04/19 1130 93     Resp 05/04/19 1130 (!) 27     Temp 05/04/19 1130 97.7 F (36.5 C)     Temp Source 05/04/19 1130 Oral     SpO2 05/04/19 1130 100 %     Weight  05/04/19 1127 244 lb 4.3 oz (110.8 kg)     Height 05/04/19 1127 5' 9"  (1.753 m)     Head Circumference --      Peak Flow --      Pain Score 05/04/19 1126 9     Pain Loc --      Pain Edu? --      Excl. in [place]? --    Constitutional: Alert and oriented. Well appearing and in no distress. Eyes: Normal exam ENT      Head: Normocephalic and atraumatic.      Mouth/Throat: Mucous membranes are moist. Cardiovascular: Normal rate, regular rhythm. Respiratory: Normal respiratory effort without tachypnea nor retractions. Breath sounds are clear  Gastrointestinal: Soft and nontender. No distention.   Musculoskeletal: 2+ lower extreme edema equal bilaterally.  Mild weeping. Neurologic:  Normal speech and language. No gross focal neurologic deficits Skin:  Skin is warm, dry and intact.  Psychiatric: Mood and affect are normal.   ____________________________________________    EKG  EKG viewed and interpreted by myself shows a normal sinus rhythm at 94 bpm with a slightly widened QRS, left axis deviation, slight QTC prolongation otherwise normal intervals with nonspecific ST changes.  ____________________________________________    RADIOLOGY  Chest x-ray shows vascular congestion.  ____________________________________________   INITIAL IMPRESSION / ASSESSMENT AND PLAN / ED COURSE  Pertinent labs & imaging results that were available during my care of the patient were reviewed by me and considered in my medical decision making (see chart for details).   Patient presents to the emergency department for shortness of breath worsening over the past 1 week.  Differential would include COPD exacerbation, CHF exacerbation, pneumonia, ACS.  We will check labs including cardiac enzymes and BNP we will obtain a chest x-ray and continue to closely monitor.  Overall the patient appears well with a 100% room air saturation during my evaluation.  Patient does have significant lower extremity edema.  States  she takes diuretics but has not taken them over the last few days.  Chest x-ray shows vascular congestion.  Given the patient's significant lower extremity edema with vascular congestion we will dose 60 mg of IV Lasix.  Patient's labs have resulted showing hemoglobin of 6.4, down from greater than 9.  Rectal examination shows light brown stool, guaiac negative.  Given the patient's likely had a symptomatic anemia we will transfuse 1 unit of blood.  I discussed the risk and benefit and verbally consented the patient.  Patient will be admitted to the hospital service for further treatment and work-up.  [name] was evaluated in Emergency Department on 05/04/2019 for the symptoms described in the history of present illness. She was evaluated  in the context of the global COVID-19 pandemic, which necessitated consideration that the patient might be at risk for infection with the SARS-CoV-2 virus that causes COVID-19. Institutional protocols and algorithms that pertain to the evaluation of patients at risk for COVID-19 are in a state of rapid change based on information released by regulatory bodies including the CDC and federal and state organizations. These policies and algorithms were followed during the patient's care in the ED.  CRITICAL CARE Performed by: Harvest Dark   Total critical care time: 30 minutes  Critical care time was exclusive of separately billable procedures and treating other patients.  Critical care was necessary to treat or prevent imminent or life-threatening deterioration.  Critical care was time spent personally by me on the following activities: development of treatment plan with patient and/or surrogate as well as nursing, discussions with consultants, evaluation of patient's response to treatment, examination of patient, obtaining history from patient or surrogate, ordering and performing treatments and interventions, ordering and review of laboratory studies,  ordering and review of radiographic studies, pulse oximetry and re-evaluation of patient's condition.  ____________________________________________   FINAL CLINICAL IMPRESSION(S) / ED DIAGNOSES  Dyspnea Symptomatic anemia CHF exacerbation   Harvest Dark, MD 05/04/19 1433    Harvest Dark, MD 05/04/19 1434

## 2019-05-04 NOTE — ED Notes (Signed)
this pt c/o pain in right foot and requesting a "shot in the foot", when I went to examine maggots came out  Admitting messaged

## 2019-05-04 NOTE — ED Notes (Addendum)
Morphine overridden in pyxis  Pt report sock placed on right foot 2 days ago

## 2019-05-04 NOTE — ED Notes (Signed)
Pt sleeping with even and unlabored resp

## 2019-05-04 NOTE — ED Notes (Signed)
Provider at bedside, socks removed (bagged), no open wounds noted to feet, feet appear dirty and socks stiff with leg exudate (possible food source for maggots)  Pt reflects pain with movement to feet, left great toe removed approx 4 months prior per pt d.t infection  Right leg weeping

## 2019-05-04 NOTE — H&P (Signed)
History and Physical    PANG ROBERS  JKK:[id number]  DOB: 26-Oct-1955  DOA: 05/04/2019 PCP: [place]   Patient coming from: home  Chief Complaint: shortness of breath  HPI: [name] is a 64 y.o. female with medical history of chronic systolic heart failure (echo however shows an EF of 50 to 55%) with severe tricuspid regurgitation, GI bleed in the past due to AVMs status post APC with anemia, diabetes mellitus, cervical cancer, hypertension, CKD 3, HCV antibody positive and amputation of left great toe.  Patient states that she was in the [facility] 2 weeks ago and since she has been home she has been gaining fluid mainly in her abdomen and in her legs despite taking her torsemide.She has become progressively short of breath now shortness of breath is severe.  She states she stopped taking her torsemide 2 days ago because she was beginning to feel "bad".  She states that she does not drink enough water and probably needs to drink more water.  She states that she does not take a high sodium diet.  She has no severe cough.  She has a mild sore throat postnasal drip and a mild cough. In the ED she is also noted to be anemic.  She has not noted any blood in her stools or any black stools.  She has not vomited up any blood.   ED Course: Bicarb is 20, BUN 22 creatinine 1.66, GFR 37, BNP 778 Hemoglobin 6.4-when rechecked it is 5.7  PT 16.6/ INR 1.4  Covid negative  Chest x-ray > stable cardiomegaly with mild central pulmonary vascular congestion. Left midlung subsegmental atelectasis is noted.  Review of Systems:  All other systems reviewed and apart from HPI, are negative.  Past Medical History:  Diagnosis Date  . Cervical cancer ([place])   . CHF (congestive heart failure) ([place])   . Chronic anemia   . COPD (chronic obstructive pulmonary disease) ([place])   . Diabetes mellitus without complication ([place])   . Gastric ulcer   . NSTEMI (non-ST elevated myocardial  infarction) ([place])   . Upper GI bleed     Past Surgical History:  Procedure Laterality Date  . AMPUTATION Left 06/24/2018   Procedure: AMPUTATION GREAT TOE;  Surgeon: Samara Deist, DPM;  Location: [facility] ORS;  Service: Podiatry;  Laterality: Left;  . APPENDECTOMY    . BIOPSY  11/22/2018   Procedure: BIOPSY;  Surgeon: Thornton Park, MD;  Location: [facility];  Service: Gastroenterology;;  . CARDIAC CATHETERIZATION    . CHOLECYSTECTOMY    . COLONOSCOPY N/A 09/28/2017   Procedure: COLONOSCOPY;  Surgeon: Toledo, Benay Pike, MD;  Location: [facility] ENDOSCOPY;  Service: Gastroenterology;  Laterality: N/A;  . COLONOSCOPY WITH PROPOFOL N/A 08/21/2018   Procedure: COLONOSCOPY WITH PROPOFOL;  Surgeon: Lin Landsman, MD;  Location: [facility] ENDOSCOPY;  Service: Gastroenterology;  Laterality: N/A;  . COLONOSCOPY WITH PROPOFOL N/A 12/28/2018   Procedure: COLONOSCOPY WITH PROPOFOL;  Surgeon: Rush Landmark Telford Nab., MD;  Location: [facility] ENDOSCOPY;  Service: Gastroenterology;  Laterality: N/A;  . ENTEROSCOPY Left 11/06/2017   Procedure: ENTEROSCOPY;  Surgeon: Jonathon Bellows, MD;  Location: [facility] ENDOSCOPY;  Service: Gastroenterology;  Laterality: Left;  . ENTEROSCOPY N/A 06/22/2018   Procedure: ENTEROSCOPY;  Surgeon: Lin Landsman, MD;  Location: [facility] ENDOSCOPY;  Service: Gastroenterology;  Laterality: N/A;  . ENTEROSCOPY N/A 08/18/2018   Procedure: ENTEROSCOPY;  Surgeon: Jonathon Bellows, MD;  Location: [facility] ENDOSCOPY;  Service: Gastroenterology;  Laterality: N/A;  . ENTEROSCOPY N/A 11/01/2018  Procedure: ENTEROSCOPY;  Surgeon: Virgel Manifold, MD;  Location: [facility] ENDOSCOPY;  Service: Endoscopy;  Laterality: N/A;  . ENTEROSCOPY N/A 11/22/2018   Procedure: ENTEROSCOPY;  Surgeon: Thornton Park, MD;  Location: [facility];  Service: Gastroenterology;  Laterality: N/A;  . ENTEROSCOPY Left 12/17/2018   Procedure: ENTEROSCOPY;  Surgeon: Yetta Flock, MD;  Location: [facility] ENDOSCOPY;  Service:  Gastroenterology;  Laterality: Left;  . ENTEROSCOPY N/A 12/19/2018   Procedure: ENTEROSCOPY;  Surgeon: Yetta Flock, MD;  Location: [facility] ENDOSCOPY;  Service: Gastroenterology;  Laterality: N/A;  . ENTEROSCOPY N/A 12/20/2018   Procedure: ENTEROSCOPY;  Surgeon: Lavena Bullion, DO;  Location: [facility] ENDOSCOPY;  Service: Gastroenterology;  Laterality: N/A;  . ESOPHAGOGASTRODUODENOSCOPY N/A 12/19/2016   Procedure: ESOPHAGOGASTRODUODENOSCOPY (EGD);  Surgeon: Lin Landsman, MD;  Location: [facility] ENDOSCOPY;  Service: Gastroenterology;  Laterality: N/A;  . ESOPHAGOGASTRODUODENOSCOPY N/A 09/28/2017   Procedure: ESOPHAGOGASTRODUODENOSCOPY (EGD);  Surgeon: Toledo, Benay Pike, MD;  Location: [facility] ENDOSCOPY;  Service: Gastroenterology;  Laterality: N/A;  . ESOPHAGOGASTRODUODENOSCOPY (EGD) WITH PROPOFOL N/A 08/08/2017   Procedure: ESOPHAGOGASTRODUODENOSCOPY (EGD) WITH PROPOFOL;  Surgeon: [name], MD;  Location: [facility] ENDOSCOPY;  Service: Endoscopy;  Laterality: N/A;  . GIVENS CAPSULE STUDY N/A 10/28/2018   Procedure: GIVENS CAPSULE STUDY;  Surgeon: Jonathon Bellows, MD;  Location: [facility] ENDOSCOPY;  Service: Gastroenterology;  Laterality: N/A;  . GIVENS CAPSULE STUDY N/A 12/26/2018   Procedure: GIVENS CAPSULE STUDY;  Surgeon: Rush Landmark Telford Nab., MD;  Location: [facility] ENDOSCOPY;  Service: Gastroenterology;  Laterality: N/A;  . [place]    . HEMOSTASIS CLIP PLACEMENT  12/19/2018   Procedure: HEMOSTASIS CLIP PLACEMENT;  Surgeon: Yetta Flock, MD;  Location: [facility];  Service: Gastroenterology;;  . HEMOSTASIS CLIP PLACEMENT  12/28/2018   Procedure: HEMOSTASIS CLIP PLACEMENT;  Surgeon: Irving Copas., MD;  Location: [facility] ENDOSCOPY;  Service: Gastroenterology;;  . HOT HEMOSTASIS N/A 12/17/2018   Procedure: HOT HEMOSTASIS (ARGON PLASMA COAGULATION/BICAP);  Surgeon: Yetta Flock, MD;  Location: [facility];  Service: Gastroenterology;  Laterality: N/A;  . HOT  HEMOSTASIS N/A 12/28/2018   Procedure: HOT HEMOSTASIS (ARGON PLASMA COAGULATION/BICAP);  Surgeon: Irving Copas., MD;  Location: [facility];  Service: Gastroenterology;  Laterality: N/A;  . LEFT HEART CATH AND CORONARY ANGIOGRAPHY N/A 12/23/2016   Procedure: LEFT HEART CATH AND CORONARY ANGIOGRAPHY;  Surgeon: Teodoro Spray, MD;  Location: [facility];  Service: Cardiovascular;  Laterality: N/A;  . LOWER EXTREMITY ANGIOGRAPHY Left 06/26/2018   Procedure: Lower Extremity Angiography;  Surgeon: Katha Cabal, MD;  Location: [facility];  Service: Cardiovascular;  Laterality: Left;  . SUBMUCOSAL INJECTION  12/19/2018   Procedure: SUBMUCOSAL INJECTION;  Surgeon: Yetta Flock, MD;  Location: [facility];  Service: Gastroenterology;;  . Lia Foyer TATTOO INJECTION  12/17/2018   Procedure: SUBMUCOSAL TATTOO INJECTION;  Surgeon: Yetta Flock, MD;  Location: [facility] ENDOSCOPY;  Service: Gastroenterology;;  . Lia Foyer TATTOO INJECTION  12/28/2018   Procedure: SUBMUCOSAL TATTOO INJECTION;  Surgeon: Irving Copas., MD;  Location: [facility] ENDOSCOPY;  Service: Gastroenterology;;  . TEE WITHOUT CARDIOVERSION N/A 10/29/2018   Procedure: TRANSESOPHAGEAL ECHOCARDIOGRAM (TEE);  Surgeon: Teodoro Spray, MD;  Location: [facility] ORS;  Service: Cardiovascular;  Laterality: N/A;    Social History:   reports that she has been smoking cigarettes. She has been smoking about 0.25 packs per day. She has never used smokeless tobacco. She reports that she does not drink alcohol or use drugs.  Allergies  Allergen Reactions  . Latex  Anaphylaxis and Rash  . Gabapentin Nausea And Vomiting and Other (See Comments)    Family History  Problem Relation Age of Onset  . Hypertension Mother   . Diabetes Mellitus II Mother   . Breast cancer Mother   . Heart disease Father   . Cancer Sister      Prior to Admission medications-this medical list is not updated yet  Medication Sig  Start Date End Date Taking? Authorizing Provider  Amino Acids-Protein Hydrolys (FEEDING SUPPLEMENT, PRO-STAT SUGAR FREE 64,) LIQD Take 30 mLs by mouth 2 (two) times daily. 12/07/18   Al Decant, MD  ammonium lactate (AMLACTIN) 12 % cream Apply 1 g topically 2 (two) times daily.    [provider]  atorvastatin (LIPITOR) 40 MG tablet Take 40 mg by mouth daily.    [provider]  carvedilol (COREG) 3.125 MG tablet Take 1 tablet (3.125 mg total) by mouth 2 (two) times daily with a meal. 08/21/18   Ojie, Jude, MD  fluticasone (FLONASE) 50 MCG/ACT nasal spray Place 2 sprays into both nostrils daily.    [provider]  folic acid (FOLVITE) 1 MG tablet Take 1 tablet (1 mg total) by mouth daily. 08/21/18   Stark Jock Jude, MD  lactulose (CHRONULAC) 10 GM/15ML solution Take 30 mLs (20 g total) by mouth 3 (three) times daily. 12/07/18   Al Decant, MD  mirtazapine (REMERON SOL-TAB) 30 MG disintegrating tablet Take 1 tablet (30 mg total) by mouth at bedtime. 12/07/18   Al Decant, MD  Multiple Vitamin (MULTIVITAMIN WITH MINERALS) TABS tablet Take 1 tablet by mouth daily. 06/28/18   Lang Snow, NP  Nutritional Supplements (RESOURCE 2.0 PO) Take 1 Bottle by mouth 2 (two) times daily.    [provider]  pantoprazole (PROTONIX) 40 MG tablet Take 1 tablet (40 mg total) by mouth 2 (two) times daily. 12/24/18   Shelly Coss, MD  pregabalin (LYRICA) 150 MG capsule Take 150 mg by mouth 2 (two) times daily. 02/18/19   [provider]  senna-docusate (SENOKOT-S) 8.6-50 MG tablet Take 1 tablet by mouth at bedtime as needed for mild constipation. 01/01/19   Sheth, Vickii Chafe, MD  spironolactone (ALDACTONE) 25 MG tablet Take 12.5 mg by mouth daily.    [provider]  torsemide (DEMADEX) 20 MG tablet Take 20 mg by mouth daily. Take 2 tablets if weight is greater than 185 lbs. NO tablets if weight is less than 177 lbs.    [provider]     Physical Exam: Wt Readings from Last 3 Encounters:  05/04/19 110.8 kg  02/26/19 90.3 kg  01/02/19 77.5 kg   Vitals:   05/04/19 1245 05/04/19 1300 05/04/19 1315 05/04/19 1330  BP: 110/88 130/75 132/90 140/82  Pulse: 88 86 87 93  Resp: 18 14 17 17   Temp:      TempSrc:      SpO2: 100% 99% 100% 100%  Weight:      Height:          Constitutional:  Calm & comfortable Eyes: PERRLA, lids and conjunctivae pale ENT:  Mucous membranes are moist.  Clear pharyngeal exudate noted Neck: Supple, no masses  Respiratory:  Clear to auscultation bilaterally  Normal respiratory effort.  Cardiovascular:  S1 & S2 heard, regular rate and rhythm-2/6 murmur in lower sternal border  Abdomen:  Moderately distended No tenderness, No masses Bowel sounds normal Extremities:  No clubbing / cyanosis Severe edema with weeping tenderness of lower legs and feet No joint  deformity    Skin:  No rashes, lesions or ulcers-small ulcers left leg with drainage of clear fluid Neurologic:  AAO x 3 CN 2-12 grossly intact Sensation intact Strength 5/5 in all 4 extremities Psychiatric:  Normal Mood and affect    Labs on Admission: I have personally reviewed following labs and imaging studies  CBC: Recent Labs  Lab 05/04/19 1132  WBC 7.2  HGB 6.4*  HCT 23.4*  MCV 76.5*  PLT 060   Basic Metabolic Panel: Recent Labs  Lab 05/04/19 1132  NA 137  K 4.8  CL 109  CO2 20*  GLUCOSE 167*  BUN 22  CREATININE 1.66*  CALCIUM 8.6*   GFR: Estimated Creatinine Clearance: 45.4 mL/min (A) (by C-G formula based on SCr of 1.66 mg/dL (H)). Liver Function Tests: Recent Labs  Lab 05/04/19 1132  AST 16  ALT 9  ALKPHOS 124  BILITOT 1.0  PROT 8.9*  ALBUMIN 3.0*   No results for input(s): LIPASE, AMYLASE in the last 168 hours. No results for input(s): AMMONIA in the last 168 hours. Coagulation Profile: No results for input(s): INR, PROTIME in the last 168 hours. Cardiac Enzymes: No results  for input(s): CKTOTAL, CKMB, CKMBINDEX, TROPONINI in the last 168 hours. BNP (last 3 results) No results for input(s): PROBNP in the last 8760 hours. HbA1C: No results for input(s): HGBA1C in the last 72 hours. CBG: No results for input(s): GLUCAP in the last 168 hours. Lipid Profile: No results for input(s): CHOL, HDL, LDLCALC, TRIG, CHOLHDL, LDLDIRECT in the last 72 hours. Thyroid Function Tests: No results for input(s): TSH, T4TOTAL, FREET4, T3FREE, THYROIDAB in the last 72 hours. Anemia Panel: No results for input(s): VITAMINB12, FOLATE, FERRITIN, TIBC, IRON, RETICCTPCT in the last 72 hours. Urine analysis:    Component Value Date/Time   COLORURINE YELLOW (A) 02/23/2019 1857   APPEARANCEUR CLEAR (A) 02/23/2019 1857   APPEARANCEUR Clear 08/03/2013 2138   LABSPEC 1.015 02/23/2019 1857   LABSPEC 1.028 08/03/2013 2138   PHURINE 5.0 02/23/2019 1857   GLUCOSEU NEGATIVE 02/23/2019 1857   GLUCOSEU Negative 08/03/2013 2138   HGBUR NEGATIVE 02/23/2019 1857   BILIRUBINUR NEGATIVE 02/23/2019 1857   BILIRUBINUR Negative 08/03/2013 2138   KETONESUR NEGATIVE 02/23/2019 1857   PROTEINUR 100 (A) 02/23/2019 1857   UROBILINOGEN 0.2 09/27/2006 2132   NITRITE NEGATIVE 02/23/2019 1857   LEUKOCYTESUR SMALL (A) 02/23/2019 1857   LEUKOCYTESUR Negative 08/03/2013 2138   Sepsis Labs: @LABRCNTIP (procalcitonin:4,lacticidven:4) ) Recent Results (from the past 240 hour(s))  Respiratory Panel by RT PCR (Flu A&B, Covid) - Nasopharyngeal Swab     Status: None   Collection Time: 05/04/19  1:29 PM   Specimen: Nasopharyngeal Swab  Result Value Ref Range Status   SARS Coronavirus 2 by RT PCR NEGATIVE NEGATIVE Final    Comment: (NOTE) SARS-CoV-2 target nucleic acids are NOT DETECTED. The SARS-CoV-2 RNA is generally detectable in upper respiratoy specimens during the acute phase of infection. The lowest concentration of SARS-CoV-2 viral copies this assay can detect is 131 copies/mL. A negative result  does not preclude SARS-Cov-2 infection and should not be used as the sole basis for treatment or other patient management decisions. A negative result may occur with  improper specimen collection/handling, submission of specimen other than nasopharyngeal swab, presence of viral mutation(s) within the areas targeted by this assay, and inadequate number of viral copies (<131 copies/mL). A negative result must be combined with clinical observations, patient history, and epidemiological information. The expected result is Negative. Fact Sheet for  Patients:  PinkCheek.be Fact Sheet for Healthcare Providers:  GravelBags.it This test is not yet ap proved or cleared by the [place] and  has been authorized for detection and/or diagnosis of SARS-CoV-2 by FDA under an Emergency Use Authorization (EUA). This EUA will remain  in effect (meaning this test can be used) for the duration of the COVID-19 declaration under Section 564(b)(1) of the Act, 21 U.S.C. section 360bbb-3(b)(1), unless the authorization is terminated or revoked sooner.    Influenza A by PCR NEGATIVE NEGATIVE Final   Influenza B by PCR NEGATIVE NEGATIVE Final    Comment: (NOTE) The Xpert Xpress SARS-CoV-2/FLU/RSV assay is intended as an aid in  the diagnosis of influenza from Nasopharyngeal swab specimens and  should not be used as a sole basis for treatment. Nasal washings and  aspirates are unacceptable for Xpert Xpress SARS-CoV-2/FLU/RSV  testing. Fact Sheet for Patients: PinkCheek.be Fact Sheet for Healthcare Providers: GravelBags.it This test is not yet approved or cleared by the [place] FDA and  has been authorized for detection and/or diagnosis of SARS-CoV-2 by  FDA under an Emergency Use Authorization (EUA). This EUA will remain  in effect (meaning this test can be used) for the duration of  the  Covid-19 declaration under Section 564(b)(1) of the Act, 21  U.S.C. section 360bbb-3(b)(1), unless the authorization is  terminated or revoked. Performed at [facility], [address] [place], [place] [address]      Radiological Exams on Admission: DG Chest Portable 1 View  Result Date: 05/04/2019 CLINICAL DATA:  Shortness of breath. EXAM: PORTABLE CHEST 1 VIEW COMPARISON:  February 23, 2019. FINDINGS: Stable cardiomegaly. Status post coronary artery bypass graft. No pneumothorax or pleural effusion is noted. Mild central pulmonary vascular congestion is noted. Left midlung subsegmental atelectasis is noted. Bony thorax is unremarkable. IMPRESSION: Stable cardiomegaly with mild central pulmonary vascular congestion. Left midlung subsegmental atelectasis is noted. Electronically Signed   By: Marijo Conception M.D.   On: 05/04/2019 12:36    EKG: Independently reviewed. Sinus rhythm with RBBB  Assessment/Plan Principal Problem:  This is a sick female with numerous admissions over the past few months.  Patient was at the [facility] 2 weeks ago and has decompensated within that amount of time.    Active Problems:  Dyspnea on exertion-right-sided heart failure with swollen abdomen and lower extremities -This is secondary to the above anemia but also to significant fluid overload and weight gain-her weight was 199 pounds on 02/26/2019 and is now 244 pounds -Mild central vascular congestion on chest x-ray but no overt pulmonary edema -She appears to have mostly right-sided heart failure which may be related to her tricuspid valve -Legs are weeping and tender due to edema   -Med rec is not yet complete but she does recall that she has been taking 1 tablet of torsemide a day which is clearly not sufficient for her -I have also educated on keeping lower extremites raised and wearing TED hose which will need to be prescribed when she is discharged -2D echo 10/2018-EF 50 to 55%,  mild LVH, moderate mitral regurgitation, Moderate - severe tricuspid regurgitation by prior echocardiogram -Started on IV Lasix-we will continue this -we will repeat an echo    Symptomatic anemia - hb is 6.4  H/o Iron deficiency anemia due to chronic blood loss  12/4> EGD normal; 12/20- capsule 12/8> fresh blood in cecum ; colonoscopy 12/28/18> Two bleeding colonic angioectasias. Fresh blood and clots in colon, hemorrhoid \ -  hemoccult neg in ED - transfuse 1 U PRBC - maybe diuresing will help- will recheck Hb before ordering another unit of blood and also to allow for some more diuresis  - anemia panel ordered and needs to be followed  CKD stage IIIb -Follow creatinine while diuresing  HCV antibody positive-cirrhosis -HCV antibody reactive on 10/27/2018 -Abnormal ultrasound from 10/27/2018 revealed liver texture that was consistent with cirrhosis which is likely contributing to her fluid retention -We will obtain ultrasound to look for ascites  Acquired coagulopathy -PT is elevated at 16.6, INR is 1.4- ?  Due to cirrhosis-we will start vitamin K - PTT is normal  Obesity Body mass index is 36.07 kg/m.  Postnasal drip/seasonal allergies Start Flonase  Question neuropathy -Continue Lyrica which she does state that she takes at home  HTN - Coreg  Will need to follow med rec and resume other home meds.  DVT prophylaxis: Lovenox  Code Status: Full code  Family Communication: none  Disposition Plan: from home  Consults called: none  Admission status: inpatient    Debbe Odea MD [facility] Pager: [email or web address] Password [email or web address] [phone], please contact night-coverage   05/04/2019, 3:02 PM

## 2019-05-04 NOTE — ED Triage Notes (Signed)
Pt to ED by [facility] with c/o of shortness of breath and bilateral leg swelling. Per EMS pt 97% on RA. Pt has hx of COPD.

## 2019-05-04 NOTE — ED Notes (Signed)
Call Contact with [facility], NP, request for pain meds and foot assessment repeated  Maggots from right foot given to charge  Door to pt room found open closed and towels placed

## 2019-05-05 ENCOUNTER — Encounter: Payer: Self-pay | Admitting: Internal Medicine

## 2019-05-05 ENCOUNTER — Other Ambulatory Visit: Payer: Self-pay

## 2019-05-05 ENCOUNTER — Inpatient Hospital Stay: Payer: Medicaid Other

## 2019-05-05 DIAGNOSIS — D649 Anemia, unspecified: Secondary | ICD-10-CM

## 2019-05-05 DIAGNOSIS — I071 Rheumatic tricuspid insufficiency: Secondary | ICD-10-CM

## 2019-05-05 DIAGNOSIS — R768 Other specified abnormal immunological findings in serum: Secondary | ICD-10-CM

## 2019-05-05 DIAGNOSIS — K7469 Other cirrhosis of liver: Secondary | ICD-10-CM

## 2019-05-05 DIAGNOSIS — N1832 Chronic kidney disease, stage 3b: Secondary | ICD-10-CM

## 2019-05-05 DIAGNOSIS — I340 Nonrheumatic mitral (valve) insufficiency: Secondary | ICD-10-CM

## 2019-05-05 DIAGNOSIS — I509 Heart failure, unspecified: Secondary | ICD-10-CM

## 2019-05-05 LAB — CBC
HCT: 24.2 % — ABNORMAL LOW (ref 36.0–46.0)
Hemoglobin: 6.7 g/dL — ABNORMAL LOW (ref 12.0–15.0)
MCH: 22.1 pg — ABNORMAL LOW (ref 26.0–34.0)
MCHC: 27.7 g/dL — ABNORMAL LOW (ref 30.0–36.0)
MCV: 79.9 fL — ABNORMAL LOW (ref 80.0–100.0)
Platelets: 294 10*3/uL (ref 150–400)
RBC: 3.03 MIL/uL — ABNORMAL LOW (ref 3.87–5.11)
RDW: 21.6 % — ABNORMAL HIGH (ref 11.5–15.5)
WBC: 6.8 10*3/uL (ref 4.0–10.5)
nRBC: 0.6 % — ABNORMAL HIGH (ref 0.0–0.2)

## 2019-05-05 LAB — BASIC METABOLIC PANEL
Anion gap: 9 (ref 5–15)
BUN: 24 mg/dL — ABNORMAL HIGH (ref 8–23)
CO2: 18 mmol/L — ABNORMAL LOW (ref 22–32)
Calcium: 8.3 mg/dL — ABNORMAL LOW (ref 8.9–10.3)
Chloride: 110 mmol/L (ref 98–111)
Creatinine, Ser: 1.87 mg/dL — ABNORMAL HIGH (ref 0.44–1.00)
GFR calc Af Amer: 32 mL/min — ABNORMAL LOW (ref >60)
GFR calc non Af Amer: 28 mL/min — ABNORMAL LOW (ref >60)
Glucose, Bld: 155 mg/dL — ABNORMAL HIGH (ref 70–99)
Potassium: 4.9 mmol/L (ref 3.5–5.1)
Sodium: 137 mmol/L (ref 135–145)

## 2019-05-05 LAB — PROTIME-INR
INR: 1.4 — ABNORMAL HIGH (ref 0.8–1.2)
Prothrombin Time: 17.1 seconds — ABNORMAL HIGH (ref 11.4–15.2)

## 2019-05-05 LAB — PREPARE RBC (CROSSMATCH)

## 2019-05-05 MED ORDER — FUROSEMIDE 10 MG/ML IJ SOLN
40.0000 mg | Freq: Three times a day (TID) | INTRAMUSCULAR | Status: DC
  Administered 2019-05-06: 40 mg via INTRAVENOUS
  Filled 2019-05-05: qty 4

## 2019-05-05 MED ORDER — SODIUM CHLORIDE 0.9% IV SOLUTION: Freq: Once | INTRAVENOUS | Status: DC

## 2019-05-05 NOTE — Progress Notes (Signed)
PT Cancellation Note  Patient Details Name: [name] MRN: [id number] DOB: 1955/10/15   Cancelled Treatment:    Reason Eval/Treat Not Completed: Medical issues which prohibited therapy Pt with hemoglobin 6.7 and ordered to receive a second transfusion. Pt also with INR 1.4 and will be receiving Vitamin K as result. Will attempt PT eval when pt is appropriate.   Madilyn Hook 05/05/2019, 1:01 PM

## 2019-05-05 NOTE — Progress Notes (Signed)
PROGRESS NOTE    [name]   YOV:[id number]  DOB: May 11, 1955  DOA: 05/04/2019 PCP: [place]   Brief Narrative:  [name]  is a 64 y.o. female with medical history of chronic systolic heart failure (last echo however shows an EF of 50 to 55%) with severe tricuspid regurgitation, GI bleed in the past due to AVMs status post APC with anemia, diabetes mellitus, cervical cancer, hypertension, CKD 3, HCV antibody positive and amputation of left great toe.  Patient states that she was in the [facility] 2 weeks ago and since she has been home she has been gaining fluid mainly in her abdomen and in her legs despite taking her torsemide.She has become progressively short of breath now shortness of breath is severe.  She states she stopped taking her torsemide 2 days ago because she was beginning to feel "bad".  She states that she does not drink enough water and probably needs to drink more water.  She states that she does not take a high sodium diet.  She has no severe cough.  She has a mild sore throat postnasal drip and a mild cough. In the ED she is also noted to be anemic.  She has not noted any blood in her stools or any black stools.  She has not vomited up any blood.  Noted to have a Hb of 6.4. On recheck in ED it was 5.7. ordered 1 U PRBC and IV Lasix.   Subjective: Sleeping and very groggy. Food tray next to her shows she has eaten breakfast.   No complaints.     Assessment & Plan:   Dyspnea on exertion-right-sided heart failure with swollen abdomen and lower extremities -This is secondary to the anemia but also to significant fluid overload and weight gain-her weight was - weights> 199 pounds on 02/26/2019 and is 244 pounds in ED -Mild central vascular congestion on chest x-ray but no overt pulmonary edema -She appears to have mostly right-sided heart failure which may be related to her tricuspid valve - Legs are weeping and tender due to edema   - venous  duplex ordered by night physician neg for DVT -  she does recall that she has been taking 1 tablet of torsemide a day which is clearly not sufficient for her -2D echo 10/2018-EF 50 to 55%, mild LVH, moderate mitral regurgitation, Moderate - severe tricuspid regurgitation by prior echocardiogram -Started on IV Lasix 60 mg Q6  - weights> 199 pounds on 02/26/2019 and is 244 pounds in ED>> 227  - repeating echo - states she is less short of breath today    Symptomatic anemia. microcytosis - hb 6.4 and the 5.7 in ED - H/o Iron deficiency anemia due to chronic blood loss  - 12/21/18> EGD normal -01/06/19- capsule 12/8> fresh blood in cecum  - 01/08/19>  > Two bleeding colonic angioectasias. Fresh blood and clots in colon & hemorrhoid - hemoccult neg in ED - transfused 1 U PRBC brought Hb up to 6.7 - will give 1 more U PRBC today - anemia panel 4/18   11  Iron    UIBC  287  UIBC   TIBC  298  TIBC   Saturation Ratios  4  Saturation Ratios   Ferritin  24  Ferritin   Folate  28.0  Folate   Vit B12 604  CKD stage IIIb - slight Cr bump from 1.66 to 1.87 with Lasix  HCV antibody positive-cirrhosis -HCV antibody reactive on  10/27/2018 -Abnormal ultrasound from 10/27/2018 revealed liver texture that was consistent with cirrhosis which is likely contributing to her fluid retention - ultrasound to look for ascites on 4/17 did not show any   Acquired coagulopathy -PT is elevated at 16.6, INR is 1.4- ?  Due to cirrhosis-we will start vitamin K- following INR - PTT is normal  Obesity Body mass index is 36.07 kg/m.  Postnasal drip/seasonal allergies - Start Flonase  Question neuropathy -Continue Lyrica which she does state that she takes at home  HTN - Coreg with holding parameters   The Ed RN though he saw maggots coming out of her foot. No one has has seen any as of yet.  Time spent in minutes: 40 DVT prophylaxis: Lovenox Code Status: Full code Family Communication:   Disposition Plan:  From home Anticipated d/c is to: SNF Currently on IV Lasix and need PRBC today Consultants:   none Procedures:   none Antimicrobials:  Anti-infectives (From admission, onward)   None       Objective: Vitals:   05/05/19 0600 05/05/19 0613 05/05/19 0759 05/05/19 1000  BP:  (!) 125/91 (!) 112/59   Pulse:  67 74   Resp:  16 18   Temp:   97.9 F (36.6 C)   TempSrc:      SpO2:  98% 99%   Weight: 108.8 kg   103.1 kg  Height: 5' 5"  (1.651 m)   5' 5"  (1.651 m)    Intake/Output Summary (Last 24 hours) at 05/05/2019 1015 Last data filed at 05/05/2019 0950 Gross per 24 hour  Intake 1420 ml  Output 450 ml  Net 970 ml   Filed Weights   05/04/19 1127 05/05/19 0600 05/05/19 1000  Weight: 110.8 kg 108.8 kg 103.1 kg    Examination: General exam: Appears comfortable  HEENT: PERRLA, oral mucosa moist, no sclera icterus or thrush Respiratory system: Clear to auscultation. Respiratory effort normal. Cardiovascular system: S1 & S2 heard, RRR.2/6 murmur at right sternal border   Gastrointestinal system: Abdomen soft, non-tender, nondistended. Normal bowel sounds. Central nervous system: too sleepy to cooperate Extremities: No cyanosis, clubbing - edema in left leg improved but right leg and foot still quite swollen Skin: has skin tears       Data Reviewed: I have personally reviewed following labs and imaging studies  CBC: Recent Labs  Lab 05/04/19 1132 05/04/19 1516 05/05/19 0458  WBC 7.2 6.9 6.8  HGB 6.4* 5.7* 6.7*  HCT 23.4* 21.6* 24.2*  MCV 76.5* 78.0* 79.9*  PLT 317 296 938   Basic Metabolic Panel: Recent Labs  Lab 05/04/19 1132 05/04/19 1516 05/05/19 0458  NA 137  --  137  K 4.8  --  4.9  CL 109  --  110  CO2 20*  --  18*  GLUCOSE 167*  --  155*  BUN 22  --  24*  CREATININE 1.66* 1.81* 1.87*  CALCIUM 8.6*  --  8.3*   GFR: Estimated Creatinine Clearance: 36.2 mL/min (A) (by C-G formula based on SCr of 1.87 mg/dL (H)). Liver  Function Tests: Recent Labs  Lab 05/04/19 1132  AST 16  ALT 9  ALKPHOS 124  BILITOT 1.0  PROT 8.9*  ALBUMIN 3.0*   No results for input(s): LIPASE, AMYLASE in the last 168 hours. No results for input(s): AMMONIA in the last 168 hours. Coagulation Profile: Recent Labs  Lab 05/04/19 1516 05/05/19 0458  INR 1.4* 1.4*   Cardiac Enzymes: No results for input(s): CKTOTAL, CKMB, CKMBINDEX,  TROPONINI in the last 168 hours. BNP (last 3 results) No results for input(s): PROBNP in the last 8760 hours. HbA1C: No results for input(s): HGBA1C in the last 72 hours. CBG: No results for input(s): GLUCAP in the last 168 hours. Lipid Profile: No results for input(s): CHOL, HDL, LDLCALC, TRIG, CHOLHDL, LDLDIRECT in the last 72 hours. Thyroid Function Tests: Recent Labs    05/04/19 1516  TSH 2.089   Anemia Panel: Recent Labs    05/04/19 1516  VITAMINB12 604  FOLATE 28.0  FERRITIN 24  TIBC 298  IRON 11*  RETICCTPCT 2.9   Urine analysis:    Component Value Date/Time   COLORURINE YELLOW (A) 05/04/2019 1917   APPEARANCEUR HAZY (A) 05/04/2019 1917   APPEARANCEUR Clear 08/03/2013 2138   LABSPEC 1.010 05/04/2019 1917   LABSPEC 1.028 08/03/2013 2138   PHURINE 5.0 05/04/2019 [place] 05/04/2019 1917   GLUCOSEU Negative 08/03/2013 2138   HGBUR SMALL (A) 05/04/2019 [place] NEGATIVE 05/04/2019 1917   BILIRUBINUR Negative 08/03/2013 2138   [place] NEGATIVE 05/04/2019 1917   PROTEINUR 30 (A) 05/04/2019 1917   UROBILINOGEN 0.2 09/27/2006 2132   NITRITE POSITIVE (A) 05/04/2019 1917   LEUKOCYTESUR MODERATE (A) 05/04/2019 1917   LEUKOCYTESUR Negative 08/03/2013 2138   Sepsis Labs: @LABRCNTIP (procalcitonin:4,lacticidven:4) ) Recent Results (from the past 240 hour(s))  Respiratory Panel by RT PCR (Flu A&B, Covid) - Nasopharyngeal Swab     Status: None   Collection Time: 05/04/19  1:29 PM   Specimen: Nasopharyngeal Swab  Result Value Ref Range Status    SARS Coronavirus 2 by RT PCR NEGATIVE NEGATIVE Final    Comment: (NOTE) SARS-CoV-2 target nucleic acids are NOT DETECTED. The SARS-CoV-2 RNA is generally detectable in upper respiratoy specimens during the acute phase of infection. The lowest concentration of SARS-CoV-2 viral copies this assay can detect is 131 copies/mL. A negative result does not preclude SARS-Cov-2 infection and should not be used as the sole basis for treatment or other patient management decisions. A negative result may occur with  improper specimen collection/handling, submission of specimen other than nasopharyngeal swab, presence of viral mutation(s) within the areas targeted by this assay, and inadequate number of viral copies (<131 copies/mL). A negative result must be combined with clinical observations, patient history, and epidemiological information. The expected result is Negative. Fact Sheet for Patients:  PinkCheek.be Fact Sheet for Healthcare Providers:  GravelBags.it This test is not yet ap proved or cleared by the [place] FDA and  has been authorized for detection and/or diagnosis of SARS-CoV-2 by FDA under an Emergency Use Authorization (EUA). This EUA will remain  in effect (meaning this test can be used) for the duration of the COVID-19 declaration under Section 564(b)(1) of the Act, 21 U.S.C. section 360bbb-3(b)(1), unless the authorization is terminated or revoked sooner.    Influenza A by PCR NEGATIVE NEGATIVE Final   Influenza B by PCR NEGATIVE NEGATIVE Final    Comment: (NOTE) The Xpert Xpress SARS-CoV-2/FLU/RSV assay is intended as an aid in  the diagnosis of influenza from Nasopharyngeal swab specimens and  should not be used as a sole basis for treatment. Nasal washings and  aspirates are unacceptable for Xpert Xpress SARS-CoV-2/FLU/RSV  testing. Fact Sheet for Patients: PinkCheek.be Fact  Sheet for Healthcare Providers: GravelBags.it This test is not yet approved or cleared by the [place] FDA and  has been authorized for detection and/or diagnosis of SARS-CoV-2 by  FDA under an Emergency Use Authorization (EUA).  This EUA will remain  in effect (meaning this test can be used) for the duration of the  Covid-19 declaration under Section 564(b)(1) of the Act, 21  U.S.C. section 360bbb-3(b)(1), unless the authorization is  terminated or revoked. Performed at [facility], [address] [place], [place] [address]          Radiology Studies: US Venous Img Lower Bilateral (DVT)  Result Date: 05/05/2019 CLINICAL DATA:  Bilateral leg pain and weeping edema of the left lower extremity. EXAM: BILATERAL LOWER EXTREMITY VENOUS DOPPLER ULTRASOUND TECHNIQUE: Gray-scale sonography with compression, as well as color and duplex ultrasound, were performed to evaluate the deep venous system(s) from the level of the common femoral vein through the popliteal and proximal calf veins. COMPARISON:  Ultrasounds 02/25/2019, 08/07/2018 FINDINGS: VENOUS Normal color Doppler flow is seen within the femoral, superficial femoral, and popliteal veins, as well as the visualized calf veins. Visualized portions of profunda femoral vein and great saphenous vein unremarkable. No filling defects to suggest DVT on grayscale or color Doppler imaging. Doppler waveforms show normal direction of venous flow, normal respiratory phasicity and response to augmentation. OTHER Marked lower extremity edema. Several prominent inguinal nodes with preserved nodal architecture likely edematous and or reactive. Limitations: Limited examination of the lower extremities due to patient body habitus, extreme soft tissue edema in patient's intolerance of compression technique. IMPRESSION: Sonographic evaluation for deep venous thrombosis is limited by patient's body habitus, edema and inability  to tolerate compression. Normal color flow is seen within the deep venous system of both lower extremities however small noncompressible thrombus cannot be fully excluded. If clinical symptoms are inconsistent or if there are persistent or worsening symptoms, further imaging (possibly involving the iliac veins) may be warranted. Electronically Signed   By: Lovena Le M.D.   On: 05/05/2019 01:24   DG Chest Portable 1 View  Result Date: 05/04/2019 CLINICAL DATA:  Shortness of breath. EXAM: PORTABLE CHEST 1 VIEW COMPARISON:  February 23, 2019. FINDINGS: Stable cardiomegaly. Status post coronary artery bypass graft. No pneumothorax or pleural effusion is noted. Mild central pulmonary vascular congestion is noted. Left midlung subsegmental atelectasis is noted. Bony thorax is unremarkable. IMPRESSION: Stable cardiomegaly with mild central pulmonary vascular congestion. Left midlung subsegmental atelectasis is noted. Electronically Signed   By: Marijo Conception M.D.   On: 05/04/2019 12:36   [place] ASCITES (ABDOMEN LIMITED)  Result Date: 05/04/2019 CLINICAL DATA:  Abdominal distension. EXAM: LIMITED ABDOMEN ULTRASOUND FOR ASCITES TECHNIQUE: Limited ultrasound survey for ascites was performed in all four abdominal quadrants. COMPARISON:  None. FINDINGS: No definite ascites is noted in any quadrant of the abdomen. IMPRESSION: No ascites is noted. Electronically Signed   By: Marijo Conception M.D.   On: 05/04/2019 17:03      Scheduled Meds: . sodium chloride   Intravenous Once  . atorvastatin  40 mg Oral Daily  . carvedilol  3.125 mg Oral BID WC  . enoxaparin (LOVENOX) injection  40 mg Subcutaneous Q24H  . fluticasone  2 spray Each Nare Daily  . phytonadione  10 mg Oral Daily  . pregabalin  150 mg Oral BID  . sodium chloride flush  3 mL Intravenous Q12H   Continuous Infusions: . sodium chloride       LOS: 1 day      Debbe Odea, MD [facility] Pager: [email or web address] 05/05/2019, 10:15 AM

## 2019-05-06 ENCOUNTER — Inpatient Hospital Stay (HOSPITAL_COMMUNITY)
Admit: 2019-05-06 | Discharge: 2019-05-06 | Disposition: A | Discharge status: Home / Self Care | Payer: Medicaid Other | Attending: Internal Medicine | Admitting: Internal Medicine

## 2019-05-06 DIAGNOSIS — I361 Nonrheumatic tricuspid (valve) insufficiency: Secondary | ICD-10-CM | POA: Diagnosis not present

## 2019-05-06 DIAGNOSIS — I340 Nonrheumatic mitral (valve) insufficiency: Secondary | ICD-10-CM

## 2019-05-06 DIAGNOSIS — I350 Nonrheumatic aortic (valve) stenosis: Secondary | ICD-10-CM | POA: Diagnosis not present

## 2019-05-06 LAB — TYPE AND SCREEN
ABO/RH(D): O POS
Antibody Screen: NEGATIVE
Unit division: 0
Unit division: 0

## 2019-05-06 LAB — BPAM RBC
Blood Product Expiration Date: 202105202359
Blood Product Expiration Date: 202105202359
ISSUE DATE / TIME: 202104172131
ISSUE DATE / TIME: 202104181744
Unit Type and Rh: 5100
Unit Type and Rh: 5100

## 2019-05-06 LAB — BASIC METABOLIC PANEL WITH GFR
Anion gap: 9 (ref 5–15)
BUN: 30 mg/dL — ABNORMAL HIGH (ref 8–23)
CO2: 19 mmol/L — ABNORMAL LOW (ref 22–32)
Calcium: 8.2 mg/dL — ABNORMAL LOW (ref 8.9–10.3)
Chloride: 110 mmol/L (ref 98–111)
Creatinine, Ser: 2.26 mg/dL — ABNORMAL HIGH (ref 0.44–1.00)
GFR calc Af Amer: 26 mL/min — ABNORMAL LOW
GFR calc non Af Amer: 22 mL/min — ABNORMAL LOW
Glucose, Bld: 124 mg/dL — ABNORMAL HIGH (ref 70–99)
Potassium: 5.1 mmol/L (ref 3.5–5.1)
Sodium: 138 mmol/L (ref 135–145)

## 2019-05-06 LAB — ECHOCARDIOGRAM COMPLETE
Height: 65 in
Weight: 3812.8 oz

## 2019-05-06 LAB — CBC
HCT: 26.5 % — ABNORMAL LOW (ref 36.0–46.0)
Hemoglobin: 7.7 g/dL — ABNORMAL LOW (ref 12.0–15.0)
MCH: 22.8 pg — ABNORMAL LOW (ref 26.0–34.0)
MCHC: 29.1 g/dL — ABNORMAL LOW (ref 30.0–36.0)
MCV: 78.6 fL — ABNORMAL LOW (ref 80.0–100.0)
Platelets: 276 10*3/uL (ref 150–400)
RBC: 3.37 MIL/uL — ABNORMAL LOW (ref 3.87–5.11)
RDW: 21.2 % — ABNORMAL HIGH (ref 11.5–15.5)
WBC: 8.1 10*3/uL (ref 4.0–10.5)
nRBC: 0.5 % — ABNORMAL HIGH (ref 0.0–0.2)

## 2019-05-06 LAB — MRSA PCR SCREENING: MRSA by PCR: POSITIVE — AB

## 2019-05-06 LAB — PROTIME-INR
INR: 1.2 (ref 0.8–1.2)
Prothrombin Time: 15.5 s — ABNORMAL HIGH (ref 11.4–15.2)

## 2019-05-06 MED ORDER — CHLORHEXIDINE GLUCONATE CLOTH 2 % EX PADS
6.0000 | MEDICATED_PAD | Freq: Every day | CUTANEOUS | Status: DC
  Administered 2019-05-07 – 2019-05-10 (×4): 6 via TOPICAL

## 2019-05-06 MED ORDER — HYDROCERIN EX CREA
TOPICAL_CREAM | Freq: Three times a day (TID) | CUTANEOUS | Status: DC
  Filled 2019-05-06: qty 113

## 2019-05-06 MED ORDER — MUPIROCIN 2 % EX OINT
1.0000 "application " | TOPICAL_OINTMENT | Freq: Two times a day (BID) | CUTANEOUS | Status: DC
  Administered 2019-05-06 – 2019-05-10 (×8): 1 via NASAL
  Filled 2019-05-06: qty 22

## 2019-05-06 NOTE — Evaluation (Signed)
Physical Therapy Evaluation Patient Details Name: [name] MRN: [id number] DOB: 06/20/1955 Today's Date: 05/06/2019   History of Present Illness  [name] is a 38yoF who comes to [facility] on 4/17 c SOB. was in the [facility] 2 weeks ago and since she has been home she has been gaining fluid mainly in her abdomen and in her legs despite taking her torsemide.She has become progressively short of breath now shortness of breath is severe. Hb: 6.4 in ED.  PMH: chronic systolic heart failure (echo however shows an EF of 50 to 55%) with severe tricuspid regurgitation, GI bleed in the past due to AVMs status post APC with anemia, diabetes mellitus, cervical cancer, hypertension, CKD 3, HCV antibody positive and amputation of left great toe. Pt does walk short distances at home, but also heavily relies on [facility] for mobility, largely unable to self propel. Pt has a very low level of function for her age, even in light of medical complexity, but she does not seem motivated nor think it normal for her to be any more independent at "such an old age."  Clinical Impression  Pt admitted with above diagnosis. Pt currently with functional limitations due to the deficits listed below (see "PT Problem List"). Upon entry, pt in bed, asleep, but easily made awake, and is agreeable to participate despite high levels of back pain and later BLE pain. The pt is alert and oriented x3, pleasant, conversational, and generally a fair historian at baseline. ModA to EOB, MaxA+2 for return to supine. Pt unable to rise to standing due to weakness and severe pain in BLE, at which point she begins to cry her leg pain is so severe. Pitting edema is palpated in BLE from feet to mid thigh, extremely firm to the touch. Functional mobility assessment demonstrates increased effort/time requirements, poor tolerance, and absolute need for physical assistance, whereas the patient performed these at a higher level of independence PTA. Pt will  benefit from skilled PT intervention to increase independence and safety with basic mobility in preparation for discharge to the venue listed below.       Follow Up Recommendations SNF;Supervision for mobility/OOB    Equipment Recommendations  None recommended by PT    Recommendations for Other Services       Precautions / Restrictions Precautions Precautions: Fall Restrictions Weight Bearing Restrictions: No      Mobility  Bed Mobility Overal bed mobility: Needs Assistance Bed Mobility: Supine to Sit;Sit to Supine     Supine to sit: Mod assist Sit to supine: Max assist      Transfers Overall transfer level: Needs assistance(motivated to try, but very weak and begins to cry as LEE pain increases)                  Ambulation/Gait                Stairs            Wheelchair Mobility    Modified Rankin (Stroke Patients Only)       Balance                                             Pertinent Vitals/Pain Pain Assessment: 0-10 Pain Score: 8  Pain Location: BLE Pain Intervention(s): Limited activity within patient's tolerance;Monitored during session    Home Living Family/patient expects to be discharged  to:: Private residence Living Arrangements: Spouse/significant other Available Help at Discharge: Family Type of Home: House Home Access: Stairs to enter;Ramped entrance Entrance Stairs-Rails: Right;Left;Can reach both Entrance Stairs-Number of Steps: [place]: One [place]: [place] - 2 wheels;Wheelchair - [employer];Tub bench;Grab bars - tub/shower Additional Comments: lives with husband who is wheelchair-bound and per pt, no longer able to assist her with ADLs    Prior Function Level of Independence: Needs assistance               Hand Dominance   Dominant Hand: Right    Extremity/Trunk Assessment        Lower Extremity Assessment Lower Extremity Assessment: Generalized  weakness(painful LEE up to mid thigh, quite firm, to the touch, but has pitting)       Communication      Cognition                                              General Comments      Exercises     Assessment/Plan    PT Assessment Patient needs continued PT services  PT Problem List Decreased strength;Decreased mobility;Decreased activity tolerance;Decreased range of motion;Pain       PT Treatment Interventions DME instruction;Gait training;Stair training;Functional mobility training;Therapeutic activities;Therapeutic exercise;Balance training;Patient/family education    PT Goals (Current goals can be found in the Care Plan section)  Acute Rehab PT Goals Patient Stated Goal: decrease leg pain PT Goal Formulation: With patient Time For Goal Achievement: 05/20/19 Potential to Achieve Goals: Fair    Frequency Min 2X/week   Barriers to discharge Decreased caregiver support      Co-evaluation               AM-PAC PT "6 Clicks" Mobility  Outcome Measure Help needed turning from your back to your side while in a flat bed without using bedrails?: Total Help needed moving from lying on your back to sitting on the side of a flat bed without using bedrails?: Total Help needed moving to and from a bed to a chair (including a wheelchair)?: Total Help needed standing up from a chair using your arms (e.g., wheelchair or bedside chair)?: Total Help needed to walk in hospital room?: Total Help needed climbing 3-5 steps with a railing? : Total 6 Click Score: 6    End of Session   Activity Tolerance: Patient limited by fatigue;Patient limited by pain Patient left: in bed;with call bell/phone within reach;with family/visitor present;with bed alarm set Nurse Communication: Mobility status PT Visit Diagnosis: Difficulty in walking, not elsewhere classified (R26.2);Other abnormalities of gait and mobility (R26.89)    Time: 0940-7680 PT Time Calculation (min)  (ACUTE ONLY): 13 min   Charges:   PT Evaluation $PT Eval High Complexity: 1 High          3:20 PM, 05/06/19 Etta Grandchild, PT, DPT Physical Therapist - [facility]  [phone] ([place])    [place] C 05/06/2019, 3:17 PM

## 2019-05-06 NOTE — Progress Notes (Signed)
PROGRESS NOTE    [name]   UYQ:[id number]  DOB: February 19, 1955  DOA: 05/04/2019 PCP: [id number]   Brief Narrative:  [name]  is a 64 y.o. female with medical history of chronic systolic heart failure (last echo however shows an EF of 50 to 55%) with severe tricuspid regurgitation, GI bleed in the past due to AVMs status post APC with anemia, diabetes mellitus, cervical cancer, hypertension, CKD 3, HCV antibody positive and amputation of left great toe.  Patient states that she was in the [facility] 2 weeks ago and since she has been home she has been gaining fluid mainly in her abdomen and in her legs despite taking her torsemide.She has become progressively short of breath now shortness of breath is severe.  She states she stopped taking her torsemide 2 days ago because she was beginning to feel "bad".  She states that she does not drink enough water and probably needs to drink more water.  She states that she does not take a high sodium diet.  She has no severe cough.  She has a mild sore throat postnasal drip and a mild cough. In the ED she is also noted to be anemic.  She has not noted any blood in her stools or any black stools.  She has not vomited up any blood.  Noted to have a Hb of 6.4. On recheck in ED it was 5.7. ordered 1 U PRBC and IV Lasix.   Subjective: Itching in entire back. No other complaints.     Assessment & Plan:   Dyspnea on exertion-right-sided heart failure with swollen abdomen and lower extremities -This is secondary to the anemia but also to significant fluid overload and weight gain-her weight was - weights> 199 pounds on 02/26/2019 and is 244 pounds in ED -Mild central vascular congestion on chest x-ray but no overt pulmonary edema -She appears to have mostly right-sided heart failure which may be related to her tricuspid valve - Legs are weeping and tender due to edema   - venous duplex ordered by night physician neg for DVT -  she  does recall that she has been taking 1 tablet of torsemide a day which is clearly not sufficient for her -2D echo 10/2018-EF 50 to 55%, mild LVH, moderate mitral regurgitation, Moderate - severe tricuspid regurgitation by prior echocardiogram -Started on IV Lasix 60 mg Q6  - weights> 199 pounds on 02/26/2019> 244 pounds in ED>> 227 >>238 - repeat echo- Ef 50-55%, Grade 2 d cHF, mod MR and TR - states she is less short of breath - d/c Lasix as Cr has risen significantly - pedal edema nearly resolved    Symptomatic anemia. microcytosis - hb 6.4 and the 5.7 in ED - H/o Iron deficiency anemia due to chronic blood loss  - 12/21/18> EGD normal -01/06/19- capsule 12/8> fresh blood in cecum  - 01/08/19>  > Two bleeding colonic angioectasias. Fresh blood and clots in colon & hemorrhoid - hemoccult neg in ED - transfused 1 U PRBC brought Hb up to 6.7- 1 more unit given- Hb now 7.7 - anemia panel 4/18   11  Iron    UIBC  287  UIBC   TIBC  298  TIBC   Saturation Ratios  4  Saturation Ratios   Ferritin  24  Ferritin   Folate  28.0  Folate   Vit B12 604  CKD stage IIIb - slight Cr bump from 1.66 to 2.26 with Lasix-  stop Lasix   HCV antibody positive-cirrhosis -HCV antibody reactive on 10/27/2018 -Abnormal ultrasound from 10/27/2018 revealed liver texture that was consistent with cirrhosis which is likely contributing to her fluid retention - ultrasound to look for ascites on 4/17 did not show any   Acquired coagulopathy -PT is elevated at 16.6, INR is 1.4- ?  Due to cirrhosis-we will start vitamin K- following INR - PTT is normal - 4/19- INR 1.2 today- recheck tomorrow  Obesity Body mass index is 36.07 kg/m.  Postnasal drip/seasonal allergies - Start Flonase    Neuropathy ? -Continue Lyrica which she does state that she takes at home  HTN - Coreg with holding parameters  Venous ulcers on legs, pressure ulcer - f/u wound care eval Pressure Injury 05/05/19 Thigh  Distal;Left;Posterior Unstageable - Full thickness tissue loss in which the base of the injury is covered by slough (yellow, tan, gray, green or brown) and/or eschar (tan, brown or black) in the wound bed. 4"x4" area with m (Active)  05/05/19 0700  Location: Thigh  Location Orientation: Distal;Left;Posterior  Staging: Unstageable - Full thickness tissue loss in which the base of the injury is covered by slough (yellow, tan, gray, green or brown) and/or eschar (tan, brown or black) in the wound bed.  Wound Description (Comments): 4"x4" area with mixture of pink granulated wound bed and yellow slough.  Present on Admission: Yes      The Ed RN though he saw maggots coming out of her foot. No one else  has seen any as of yet.  Time spent in minutes: 30 DVT prophylaxis: Lovenox Code Status: Full code Family Communication:  Disposition Plan:  From home Anticipated d/c is to: suspect it will be SNF- PT eval pending Needs 1 more PRBC today, I will also obtain a palliative care consult due to her recurrent admits Consultants:   none Procedures:   2 d ECHO 1. Left ventricular ejection fraction, by estimation, is 50 to 55%. The  left ventricle has low normal function. The left ventricle has no regional  wall motion abnormalities. There is moderate left ventricular hypertrophy.  Left ventricular diastolic  parameters are consistent with Grade II diastolic dysfunction  (pseudonormalization).  2. Right ventricular systolic function is normal. The right ventricular  size is normal. There is moderately elevated pulmonary artery systolic  pressure.  3. Left atrial size was moderately dilated.  4. Right atrial size was mildly dilated.  5. The mitral valve is abnormal. Moderate mitral valve regurgitation. No  evidence of mitral stenosis.  6. Tricuspid valve regurgitation is moderate.  7. The aortic valve is normal in structure. Aortic valve regurgitation is  not visualized. Mild aortic  valve stenosis.  Antimicrobials:  Anti-infectives (From admission, onward)   None       Objective: Vitals:   05/06/19 0212 05/06/19 0806 05/06/19 0811 05/06/19 1122  BP: 134/67 135/67  135/60  Pulse: 78 74  73  Resp: 20 17  18   Temp: 97.7 F (36.5 C) 98.2 F (36.8 C)  98.7 F (37.1 C)  TempSrc: Oral Oral  Oral  SpO2: 100% 98%  100%  Weight: 108.8 kg  108.1 kg   Height:        Intake/Output Summary (Last 24 hours) at 05/06/2019 1257 Last data filed at 05/06/2019 1129 Gross per 24 hour  Intake 1401 ml  Output 1000 ml  Net 401 ml   Filed Weights   05/05/19 1000 05/06/19 0212 05/06/19 0811  Weight: 103.1 kg 108.8 kg 108.1  kg    Examination: General exam: Appears comfortable  HEENT: PERRLA, oral mucosa moist, no sclera icterus or thrush Respiratory system: 2/6 murmur at right sternal border- Respiratory effort normal. Cardiovascular system: S1 & S2 heard,  No murmurs  Gastrointestinal system: Abdomen soft, non-tender, nondistended. Normal bowel sounds   Central nervous system: Alert and oriented. No focal neurological deficits. Extremities: No cyanosis, clubbing - edema is improving Psychiatry:  Mood & affect appropriate.        Data Reviewed: I have personally reviewed following labs and imaging studies  CBC: Recent Labs  Lab 05/04/19 1132 05/04/19 1516 05/05/19 0458 05/06/19 0818  WBC 7.2 6.9 6.8 8.1  HGB 6.4* 5.7* 6.7* 7.7*  HCT 23.4* 21.6* 24.2* 26.5*  MCV 76.5* 78.0* 79.9* 78.6*  PLT 317 296 294 629   Basic Metabolic Panel: Recent Labs  Lab 05/04/19 1132 05/04/19 1516 05/05/19 0458 05/06/19 0537  NA 137  --  137 138  K 4.8  --  4.9 5.1  CL 109  --  110 110  CO2 20*  --  18* 19*  GLUCOSE 167*  --  155* 124*  BUN 22  --  24* 30*  CREATININE 1.66* 1.81* 1.87* 2.26*  CALCIUM 8.6*  --  8.3* 8.2*   GFR: Estimated Creatinine Clearance: 30.7 mL/min (A) (by C-G formula based on SCr of 2.26 mg/dL (H)). Liver Function Tests: Recent Labs  Lab  05/04/19 1132  AST 16  ALT 9  ALKPHOS 124  BILITOT 1.0  PROT 8.9*  ALBUMIN 3.0*   No results for input(s): LIPASE, AMYLASE in the last 168 hours. No results for input(s): AMMONIA in the last 168 hours. Coagulation Profile: Recent Labs  Lab 05/04/19 1516 05/05/19 0458 05/06/19 0818  INR 1.4* 1.4* 1.2   Cardiac Enzymes: No results for input(s): CKTOTAL, CKMB, CKMBINDEX, TROPONINI in the last 168 hours. BNP (last 3 results) No results for input(s): PROBNP in the last 8760 hours. HbA1C: No results for input(s): HGBA1C in the last 72 hours. CBG: No results for input(s): GLUCAP in the last 168 hours. Lipid Profile: No results for input(s): CHOL, HDL, LDLCALC, TRIG, CHOLHDL, LDLDIRECT in the last 72 hours. Thyroid Function Tests: Recent Labs    05/04/19 1516  TSH 2.089   Anemia Panel: Recent Labs    05/04/19 1516  VITAMINB12 604  FOLATE 28.0  FERRITIN 24  TIBC 298  IRON 11*  RETICCTPCT 2.9   Urine analysis:    Component Value Date/Time   COLORURINE YELLOW (A) 05/04/2019 1917   APPEARANCEUR HAZY (A) 05/04/2019 1917   APPEARANCEUR Clear 08/03/2013 2138   LABSPEC 1.010 05/04/2019 1917   LABSPEC 1.028 08/03/2013 2138   PHURINE 5.0 05/04/2019 [place] 05/04/2019 1917   GLUCOSEU Negative 08/03/2013 2138   HGBUR SMALL (A) 05/04/2019 [place] NEGATIVE 05/04/2019 1917   BILIRUBINUR Negative 08/03/2013 2138   [place] NEGATIVE 05/04/2019 1917   PROTEINUR 30 (A) 05/04/2019 1917   UROBILINOGEN 0.2 09/27/2006 2132   NITRITE POSITIVE (A) 05/04/2019 1917   LEUKOCYTESUR MODERATE (A) 05/04/2019 1917   LEUKOCYTESUR Negative 08/03/2013 2138   Sepsis Labs: @LABRCNTIP (procalcitonin:4,lacticidven:4) ) Recent Results (from the past 240 hour(s))  Respiratory Panel by RT PCR (Flu A&B, Covid) - Nasopharyngeal Swab     Status: None   Collection Time: 05/04/19  1:29 PM   Specimen: Nasopharyngeal Swab  Result Value Ref Range Status   SARS Coronavirus  2 by RT PCR NEGATIVE NEGATIVE Final    Comment: (  NOTE) SARS-CoV-2 target nucleic acids are NOT DETECTED. The SARS-CoV-2 RNA is generally detectable in upper respiratoy specimens during the acute phase of infection. The lowest concentration of SARS-CoV-2 viral copies this assay can detect is 131 copies/mL. A negative result does not preclude SARS-Cov-2 infection and should not be used as the sole basis for treatment or other patient management decisions. A negative result may occur with  improper specimen collection/handling, submission of specimen other than nasopharyngeal swab, presence of viral mutation(s) within the areas targeted by this assay, and inadequate number of viral copies (<131 copies/mL). A negative result must be combined with clinical observations, patient history, and epidemiological information. The expected result is Negative. Fact Sheet for Patients:  PinkCheek.be Fact Sheet for Healthcare Providers:  GravelBags.it This test is not yet ap proved or cleared by the [place] FDA and  has been authorized for detection and/or diagnosis of SARS-CoV-2 by FDA under an Emergency Use Authorization (EUA). This EUA will remain  in effect (meaning this test can be used) for the duration of the COVID-19 declaration under Section 564(b)(1) of the Act, 21 U.S.C. section 360bbb-3(b)(1), unless the authorization is terminated or revoked sooner.    Influenza A by PCR NEGATIVE NEGATIVE Final   Influenza B by PCR NEGATIVE NEGATIVE Final    Comment: (NOTE) The Xpert Xpress SARS-CoV-2/FLU/RSV assay is intended as an aid in  the diagnosis of influenza from Nasopharyngeal swab specimens and  should not be used as a sole basis for treatment. Nasal washings and  aspirates are unacceptable for Xpert Xpress SARS-CoV-2/FLU/RSV  testing. Fact Sheet for Patients: PinkCheek.be Fact Sheet for Healthcare  Providers: GravelBags.it This test is not yet approved or cleared by the [place] FDA and  has been authorized for detection and/or diagnosis of SARS-CoV-2 by  FDA under an Emergency Use Authorization (EUA). This EUA will remain  in effect (meaning this test can be used) for the duration of the  Covid-19 declaration under Section 564(b)(1) of the Act, 21  U.S.C. section 360bbb-3(b)(1), unless the authorization is  terminated or revoked. Performed at [facility], [address], [place], [place] [address]          Radiology Studies: US Venous Img Lower Bilateral (DVT)  Result Date: 05/05/2019 CLINICAL DATA:  Bilateral leg pain and weeping edema of the left lower extremity. EXAM: BILATERAL LOWER EXTREMITY VENOUS DOPPLER ULTRASOUND TECHNIQUE: Gray-scale sonography with compression, as well as color and duplex ultrasound, were performed to evaluate the deep venous system(s) from the level of the common femoral vein through the popliteal and proximal calf veins. COMPARISON:  Ultrasounds 02/25/2019, 08/07/2018 FINDINGS: VENOUS Normal color Doppler flow is seen within the femoral, superficial femoral, and popliteal veins, as well as the visualized calf veins. Visualized portions of profunda femoral vein and great saphenous vein unremarkable. No filling defects to suggest DVT on grayscale or color Doppler imaging. Doppler waveforms show normal direction of venous flow, normal respiratory phasicity and response to augmentation. OTHER Marked lower extremity edema. Several prominent inguinal nodes with preserved nodal architecture likely edematous and or reactive. Limitations: Limited examination of the lower extremities due to patient body habitus, extreme soft tissue edema in patient's intolerance of compression technique. IMPRESSION: Sonographic evaluation for deep venous thrombosis is limited by patient's body habitus, edema and inability to tolerate  compression. Normal color flow is seen within the deep venous system of both lower extremities however small noncompressible thrombus cannot be fully excluded. If clinical symptoms are inconsistent or if there  are persistent or worsening symptoms, further imaging (possibly involving the iliac veins) may be warranted. Electronically Signed   By: Lovena Le M.D.   On: 05/05/2019 01:24   ECHOCARDIOGRAM COMPLETE  Result Date: 05/06/2019    ECHOCARDIOGRAM REPORT   Patient Name:   IMARI REEN Date of Exam: 05/06/2019 Medical Rec #:  [id number]         Height:       65.0 in Accession #:    [id number]        Weight:       238.3 lb Date of Birth:  [birth year]         BSA:          2.131 m Patient Age:    17 years          BP:           135/67 mmHg Patient Gender: F                 HR:           74 bpm. Exam Location:  [facility] Procedure: 2D Echo, Color Doppler and Cardiac Doppler Indications:     Tricuspid valve disease 107.9  History:         Patient has prior history of Echocardiogram examinations, most                  recent 10/29/2018. CHF, COPD; Risk Factors:Diabetes. NSTEMI.  Sonographer:     Sherrie Sport RDCS (AE) Referring Phys:  Redbird Smith Diagnosing Phys: Kathlyn Sacramento MD IMPRESSIONS  1. Left ventricular ejection fraction, by estimation, is 50 to 55%. The left ventricle has low normal function. The left ventricle has no regional wall motion abnormalities. There is moderate left ventricular hypertrophy. Left ventricular diastolic parameters are consistent with Grade II diastolic dysfunction (pseudonormalization).  2. Right ventricular systolic function is normal. The right ventricular size is normal. There is moderately elevated pulmonary artery systolic pressure.  3. Left atrial size was moderately dilated.  4. Right atrial size was mildly dilated.  5. The mitral valve is abnormal. Moderate mitral valve regurgitation. No evidence of mitral stenosis.  6. Tricuspid valve regurgitation is moderate.  7. The  aortic valve is normal in structure. Aortic valve regurgitation is not visualized. Mild aortic valve stenosis. FINDINGS  Left Ventricle: Left ventricular ejection fraction, by estimation, is 50 to 55%. The left ventricle has low normal function. The left ventricle has no regional wall motion abnormalities. The left ventricular internal cavity size was normal in size. There is moderate left ventricular hypertrophy. Left ventricular diastolic parameters are consistent with Grade II diastolic dysfunction (pseudonormalization). Right Ventricle: The right ventricular size is normal. No increase in right ventricular wall thickness. Right ventricular systolic function is normal. There is moderately elevated pulmonary artery systolic pressure. The tricuspid regurgitant velocity is 2.86 m/s, and with an assumed right atrial pressure of 10 mmHg, the estimated right ventricular systolic pressure is 74.0 mmHg. Left Atrium: Left atrial size was moderately dilated. Right Atrium: Right atrial size was mildly dilated. Pericardium: There is no evidence of pericardial effusion. Mitral Valve: The mitral valve is abnormal. There is severe thickening of the mitral valve leaflet(s). There is severe calcification of the mitral valve leaflet(s). Normal mobility of the mitral valve leaflets. Moderate mitral annular calcification. Moderate mitral valve regurgitation. No evidence of mitral valve stenosis. Tricuspid Valve: The tricuspid valve is normal in structure. Tricuspid valve regurgitation is moderate . No evidence of tricuspid stenosis.  Aortic Valve: The aortic valve is normal in structure. Aortic valve regurgitation is not visualized. Mild aortic stenosis is present. Aortic valve mean gradient measures 7.3 mmHg. Aortic valve peak gradient measures 12.6 mmHg. Aortic valve area, by VTI measures 1.38 cm. Pulmonic Valve: The pulmonic valve was normal in structure. Pulmonic valve regurgitation is mild. No evidence of pulmonic stenosis.  Aorta: The aortic root is normal in size and structure. Venous: The inferior vena cava was not well visualized. IAS/Shunts: No atrial level shunt detected by color flow Doppler.  LEFT VENTRICLE PLAX 2D LVIDd:         4.62 cm  Diastology LVIDs:         3.28 cm  LV e' lateral:   8.92 cm/s LV PW:         1.51 cm  LV E/e' lateral: 16.3 LV IVS:        1.48 cm  LV e' medial:    5.44 cm/s LVOT diam:     2.00 cm  LV E/e' medial:  26.7 LV SV:         50 LV SV Index:   23 LVOT Area:     3.14 cm  RIGHT VENTRICLE RV Basal diam:  4.16 cm RV S prime:     5.22 cm/s TAPSE (M-mode): 3.0 cm LEFT ATRIUM              Index       RIGHT ATRIUM           Index LA diam:        4.50 cm  2.11 cm/m  RA Area:     21.60 cm LA Vol (A2C):   115.0 ml 53.96 ml/m RA Volume:   67.40 ml  31.63 ml/m LA Vol (A4C):   44.8 ml  21.02 ml/m LA Biplane Vol: 80.1 ml  37.59 ml/m  AORTIC VALVE                    PULMONIC VALVE AV Area (Vmax):    1.19 cm     PV Vmax:        0.64 m/s AV Area (Vmean):   1.20 cm     PV Peak grad:   1.6 mmHg AV Area (VTI):     1.38 cm     RVOT Peak grad: 3 mmHg AV Vmax:           177.67 cm/s AV Vmean:          124.333 cm/s AV VTI:            0.359 m AV Peak Grad:      12.6 mmHg AV Mean Grad:      7.3 mmHg LVOT Vmax:         67.20 cm/s LVOT Vmean:        47.300 cm/s LVOT VTI:          0.158 m LVOT/AV VTI ratio: 0.44  AORTA Ao Root diam: 2.00 cm MITRAL VALVE                TRICUSPID VALVE MV Area (PHT): 3.12 cm     TR Peak grad:   32.7 mmHg MV Decel Time: 243 msec     TR Vmax:        286.00 cm/s MV E velocity: 145.00 cm/s MV A velocity: 71.80 cm/s   SHUNTS MV E/A ratio:  2.02         Systemic VTI:  0.16 m  Systemic Diam: 2.00 cm Kathlyn Sacramento MD Electronically signed by Kathlyn Sacramento MD Signature Date/Time: 05/06/2019/11:59:18 AM    Final    [place] ASCITES (ABDOMEN LIMITED)  Result Date: 05/04/2019 CLINICAL DATA:  Abdominal distension. EXAM: LIMITED ABDOMEN ULTRASOUND FOR ASCITES TECHNIQUE:  Limited ultrasound survey for ascites was performed in all four abdominal quadrants. COMPARISON:  None. FINDINGS: No definite ascites is noted in any quadrant of the abdomen. IMPRESSION: No ascites is noted. Electronically Signed   By: Marijo Conception M.D.   On: 05/04/2019 17:03      Scheduled Meds:  sodium chloride   Intravenous Once   atorvastatin  40 mg Oral Daily   carvedilol  3.125 mg Oral BID WC   enoxaparin (LOVENOX) injection  40 mg Subcutaneous Q24H   fluticasone  2 spray Each Nare Daily   hydrocerin   Topical TID   phytonadione  10 mg Oral Daily   pregabalin  150 mg Oral BID   sodium chloride flush  3 mL Intravenous Q12H   Continuous Infusions:  sodium chloride       LOS: 2 days      Debbe Odea, MD [facility] Pager: [email or web address] 05/06/2019, 12:57 PM

## 2019-05-06 NOTE — Progress Notes (Signed)
*  PRELIMINARY RESULTS* Echocardiogram 2D Echocardiogram has been performed.  Sherrie Sport 05/06/2019, 9:35 AM

## 2019-05-07 LAB — BASIC METABOLIC PANEL
Anion gap: 6 (ref 5–15)
BUN: 33 mg/dL — ABNORMAL HIGH (ref 8–23)
CO2: 21 mmol/L — ABNORMAL LOW (ref 22–32)
Calcium: 7.8 mg/dL — ABNORMAL LOW (ref 8.9–10.3)
Chloride: 108 mmol/L (ref 98–111)
Creatinine, Ser: 2.34 mg/dL — ABNORMAL HIGH (ref 0.44–1.00)
GFR calc Af Amer: 25 mL/min — ABNORMAL LOW (ref >60)
GFR calc non Af Amer: 21 mL/min — ABNORMAL LOW (ref >60)
Glucose, Bld: 182 mg/dL — ABNORMAL HIGH (ref 70–99)
Potassium: 4.8 mmol/L (ref 3.5–5.1)
Sodium: 135 mmol/L (ref 135–145)

## 2019-05-07 LAB — PROTIME-INR
INR: 1.2 (ref 0.8–1.2)
Prothrombin Time: 15 seconds (ref 11.4–15.2)

## 2019-05-07 MED ORDER — PRO-STAT SUGAR FREE PO LIQD
30.0000 mL | Freq: Two times a day (BID) | ORAL | Status: DC
  Administered 2019-05-08 – 2019-05-10 (×3): 30 mL via ORAL

## 2019-05-07 MED ORDER — FOLIC ACID 1 MG PO TABS
1.0000 mg | ORAL_TABLET | Freq: Every day | ORAL | Status: DC
  Administered 2019-05-07 – 2019-05-10 (×4): 1 mg via ORAL
  Filled 2019-05-07 (×5): qty 1

## 2019-05-07 MED ORDER — PREGABALIN 75 MG PO CAPS
75.0000 mg | ORAL_CAPSULE | Freq: Two times a day (BID) | ORAL | Status: DC
  Administered 2019-05-07 – 2019-05-08 (×2): 75 mg via ORAL
  Filled 2019-05-07 (×2): qty 1

## 2019-05-07 MED ORDER — SENNOSIDES-DOCUSATE SODIUM 8.6-50 MG PO TABS: 1.0000 | ORAL_TABLET | Freq: Every evening | ORAL | Status: DC | PRN

## 2019-05-07 MED ORDER — ADULT MULTIVITAMIN W/MINERALS CH
1.0000 | ORAL_TABLET | Freq: Every day | ORAL | Status: DC
  Administered 2019-05-07 – 2019-05-10 (×4): 1 via ORAL
  Filled 2019-05-07 (×5): qty 1

## 2019-05-07 MED ORDER — MIRTAZAPINE 15 MG PO TABS
30.0000 mg | ORAL_TABLET | Freq: Every day | ORAL | Status: DC
  Administered 2019-05-07: 30 mg via ORAL
  Filled 2019-05-07: qty 2

## 2019-05-07 MED ORDER — MAGNESIUM HYDROXIDE 400 MG/5ML PO SUSP
5.0000 mL | Freq: Every day | ORAL | Status: DC | PRN
  Administered 2019-05-07: 5 mL via ORAL
  Filled 2019-05-07: qty 30

## 2019-05-07 MED ORDER — LACTULOSE 10 GM/15ML PO SOLN: 20.0000 g | Freq: Three times a day (TID) | ORAL | Status: DC

## 2019-05-07 NOTE — Progress Notes (Addendum)
PROGRESS NOTE    [name]   PYK:[id number]  DOB: 03/30/1955  DOA: 05/04/2019 PCP: [facility]   Brief Narrative:  [name]  is a 64 y.o. female with medical history of chronic systolic heart failure (last echo however shows an EF of 50 to 55%) with severe tricuspid regurgitation, GI bleed in the past due to AVMs status post APC with anemia, diabetes mellitus, cervical cancer, hypertension, CKD 3, HCV antibody positive and amputation of left great toe.  Patient states that she was in the [facility] 2 weeks ago and since she has been home she has been gaining fluid mainly in her abdomen and in her legs despite taking her torsemide.She has become progressively short of breath now shortness of breath is severe.  She states she stopped taking her torsemide 2 days ago because she was beginning to feel "bad".  She states that she does not drink enough water and probably needs to drink more water.  She states that she does not take a high sodium diet.  She has no severe cough.  She has a mild sore throat postnasal drip and a mild cough. In the ED she is also noted to be anemic.  She has not noted any blood in her stools or any black stools.  She has not vomited up any blood.  Noted to have a Hb of 6.4. On recheck in ED it was 5.7. ordered 1 U PRBC and IV Lasix.   Subjective:  No complaints today.     Assessment & Plan:   Dyspnea on exertion-right-sided heart failure with swollen abdomen and lower extremities -This is secondary to the anemia but also to significant fluid overload and weight gain-her weight was - weights> 199 pounds on 02/26/2019 and is 244 pounds in ED -Mild central vascular congestion on chest x-ray but no overt pulmonary edema - Legs are weeping and tender due to edema   - venous duplex ordered by night physician neg for DVT -  she does recall that she has been taking 1 tablet of torsemide a day up until 2 days prior to admission but could not recall  if she was taking spironolactone- -2D echo 10/2018-EF 50 to 55%, mild LVH, moderate mitral regurgitation, Moderate - severe tricuspid regurgitation by prior echocardiogram -Started on IV Lasix 60 mg Q6  - repeat echo- Ef 50-55%, Grade 2 d cHF, mod MR and mod TR - states she is no longer short of breath -  pedal edema has resolved as of 4/19- d/c Lasix on 4/19 as Cr has risen significantly - weights> 199 pounds on 02/26/2019> 244 pounds in ED>> 227 >>238>>245 - weights seem inaccurate as documented weight has gone up    Symptomatic anemia. microcytosis - hb 6.4 and the 5.7 in ED - H/o Iron deficiency anemia due to chronic blood loss - hemoccult neg in ED - 12/21/18> EGD normal -01/06/19- capsule 12/8> fresh blood in cecum  - 01/08/19>  > Two bleeding colonic angioectasias. Fresh blood and clots in colon & hemorrhoid - transfused 1 U PRBC brought Hb up to 6.7- 1 more unit given- Hb now 7.7 - anemia panel 4/18 - recommend> follow Hb every month for now   11  Iron    UIBC  287  UIBC   TIBC  298  TIBC   Saturation Ratios  4  Saturation Ratios   Ferritin  24  Ferritin   Folate  28.0  Folate   Vit B12 604  CKD stage IIIb - slight Cr bump from 1.66 to 2.26 with Lasix- stopped Lasix 4/19- cr slightly higher today- follow- she appears to be eating and drinking well  HCV antibody positive-cirrhosis -HCV antibody reactive on 10/27/2018 -Abnormal ultrasound from 10/27/2018 revealed liver texture that was consistent with cirrhosis  -she stated that she was gaining fluid in her stomach but ultrasound to look for ascites on 4/17 did not show any ascites  Acquired coagulopathy due to vit K deficiency -PT is elevated at 16.6, INR is 1.4- ?  Due to cirrhosis-we will start vitamin K- following INR - PTT is normal - 4/19- INR 1.2 now  Obesity Body mass index is 36.07 kg/m.  Postnasal drip/seasonal allergies - Start Flonase    Neuropathy ? -Continue Lyrica which she does state that she  takes at home- dose reduced from 150 to 75 mg due to current renal function  HTN - Coreg with holding parameters  Venous ulcers on legs, pressure ulcer - f/u wound care eval Pressure Injury 05/05/19 Thigh Distal;Left;Posterior Unstageable - Full thickness tissue loss in which the base of the injury is covered by slough (yellow, tan, gray, green or brown) and/or eschar (tan, brown or black) in the wound bed. 4"x4" area with m (Active)  05/05/19 0700  Location: Thigh  Location Orientation: Distal;Left;Posterior  Staging: Unstageable - Full thickness tissue loss in which the base of the injury is covered by slough (yellow, tan, gray, green or brown) and/or eschar (tan, brown or black) in the wound bed.  Wound Description (Comments): 4"x4" area with mixture of pink granulated wound bed and yellow slough.  Present on Admission: Yes      The Ed RN though he saw maggots coming out of her foot. No one else  has seen any as of yet.  Time spent in minutes: 30 DVT prophylaxis: Lovenox Code Status: Full code Family Communication:  Disposition Plan:  From home Anticipated d/c is to:  SNF- patient in agreement She is stable for d/c awaiting SNF- wound care eval pending but this can be done at SNF as well- based on the fact that she was only home for 2 wks, I do not feel she will do well at home. Consultants:   none Procedures:   2 d ECHO 1. Left ventricular ejection fraction, by estimation, is 50 to 55%. The  left ventricle has low normal function. The left ventricle has no regional  wall motion abnormalities. There is moderate left ventricular hypertrophy.  Left ventricular diastolic  parameters are consistent with Grade II diastolic dysfunction  (pseudonormalization).  2. Right ventricular systolic function is normal. The right ventricular  size is normal. There is moderately elevated pulmonary artery systolic  pressure.  3. Left atrial size was moderately dilated.  4. Right  atrial size was mildly dilated.  5. The mitral valve is abnormal. Moderate mitral valve regurgitation. No  evidence of mitral stenosis.  6. Tricuspid valve regurgitation is moderate.  7. The aortic valve is normal in structure. Aortic valve regurgitation is  not visualized. Mild aortic valve stenosis.  Antimicrobials:  Anti-infectives (From admission, onward)   None       Objective: Vitals:   05/07/19 0804 05/07/19 1200 05/07/19 1203 05/07/19 1347  BP: 137/78  (!) 148/65   Pulse: 73 84 82   Resp: 18  18   Temp: 98.1 F (36.7 C)  98 F (36.7 C)   TempSrc: Oral     SpO2: 100% 99% 100%   Weight:  112 kg  Height:        Intake/Output Summary (Last 24 hours) at 05/07/2019 1548 Last data filed at 05/07/2019 1330 Gross per 24 hour  Intake 1320 ml  Output 1350 ml  Net -30 ml   Filed Weights   05/06/19 0811 05/07/19 0507 05/07/19 1347  Weight: 108.1 kg 111.1 kg 112 kg    Examination:  General exam: Appears comfortable  HEENT: PERRLA, oral mucosa moist, no sclera icterus or thrush Respiratory system: Clear to auscultation. Respiratory effort normal. Cardiovascular system: S1 & S2 heard,  + 2/6 murmur at sternal border Gastrointestinal system: Abdomen soft, non-tender, nondistended. Normal bowel sounds   Central nervous system: Alert and oriented. No focal neurological deficits. Extremities: No cyanosis, clubbing or edema- legs are still tender- skin wrinkled and thick Skin: No rashes- small ulcers on b/l legs due to edema are healing Psychiatry:  Mood & affect appropriate.   Data Reviewed: I have personally reviewed following labs and imaging studies  CBC: Recent Labs  Lab 05/04/19 1132 05/04/19 1516 05/05/19 0458 05/06/19 0818  WBC 7.2 6.9 6.8 8.1  HGB 6.4* 5.7* 6.7* 7.7*  HCT 23.4* 21.6* 24.2* 26.5*  MCV 76.5* 78.0* 79.9* 78.6*  PLT 317 296 294 976   Basic Metabolic Panel: Recent Labs  Lab 05/04/19 1132 05/04/19 1516 05/05/19 0458 05/06/19 0537  05/07/19 0418  NA 137  --  137 138 135  K 4.8  --  4.9 5.1 4.8  CL 109  --  110 110 108  CO2 20*  --  18* 19* 21*  GLUCOSE 167*  --  155* 124* 182*  BUN 22  --  24* 30* 33*  CREATININE 1.66* 1.81* 1.87* 2.26* 2.34*  CALCIUM 8.6*  --  8.3* 8.2* 7.8*   GFR: Estimated Creatinine Clearance: 30.3 mL/min (A) (by C-G formula based on SCr of 2.34 mg/dL (H)). Liver Function Tests: Recent Labs  Lab 05/04/19 1132  AST 16  ALT 9  ALKPHOS 124  BILITOT 1.0  PROT 8.9*  ALBUMIN 3.0*   No results for input(s): LIPASE, AMYLASE in the last 168 hours. No results for input(s): AMMONIA in the last 168 hours. Coagulation Profile: Recent Labs  Lab 05/04/19 1516 05/05/19 0458 05/06/19 0818 05/07/19 0418  INR 1.4* 1.4* 1.2 1.2   Cardiac Enzymes: No results for input(s): CKTOTAL, CKMB, CKMBINDEX, TROPONINI in the last 168 hours. BNP (last 3 results) No results for input(s): PROBNP in the last 8760 hours. HbA1C: No results for input(s): HGBA1C in the last 72 hours. CBG: No results for input(s): GLUCAP in the last 168 hours. Lipid Profile: No results for input(s): CHOL, HDL, LDLCALC, TRIG, CHOLHDL, LDLDIRECT in the last 72 hours. Thyroid Function Tests: No results for input(s): TSH, T4TOTAL, FREET4, T3FREE, THYROIDAB in the last 72 hours. Anemia Panel: No results for input(s): VITAMINB12, FOLATE, FERRITIN, TIBC, IRON, RETICCTPCT in the last 72 hours. Urine analysis:    Component Value Date/Time   COLORURINE YELLOW (A) 05/04/2019 1917   APPEARANCEUR HAZY (A) 05/04/2019 1917   APPEARANCEUR Clear 08/03/2013 2138   LABSPEC 1.010 05/04/2019 1917   LABSPEC 1.028 08/03/2013 2138   PHURINE 5.0 05/04/2019 [place] 05/04/2019 1917   GLUCOSEU Negative 08/03/2013 2138   HGBUR SMALL (A) 05/04/2019 [place] NEGATIVE 05/04/2019 1917   BILIRUBINUR Negative 08/03/2013 2138   [place] NEGATIVE 05/04/2019 1917   PROTEINUR 30 (A) 05/04/2019 1917   UROBILINOGEN 0.2  09/27/2006 2132   NITRITE POSITIVE (A) 05/04/2019 1917  LEUKOCYTESUR MODERATE (A) 05/04/2019 1917   LEUKOCYTESUR Negative 08/03/2013 2138   Sepsis Labs: @LABRCNTIP (procalcitonin:4,lacticidven:4) ) Recent Results (from the past 240 hour(s))  Respiratory Panel by RT PCR (Flu A&B, Covid) - Nasopharyngeal Swab     Status: None   Collection Time: 05/04/19  1:29 PM   Specimen: Nasopharyngeal Swab  Result Value Ref Range Status   SARS Coronavirus 2 by RT PCR NEGATIVE NEGATIVE Final    Comment: (NOTE) SARS-CoV-2 target nucleic acids are NOT DETECTED. The SARS-CoV-2 RNA is generally detectable in upper respiratoy specimens during the acute phase of infection. The lowest concentration of SARS-CoV-2 viral copies this assay can detect is 131 copies/mL. A negative result does not preclude SARS-Cov-2 infection and should not be used as the sole basis for treatment or other patient management decisions. A negative result may occur with  improper specimen collection/handling, submission of specimen other than nasopharyngeal swab, presence of viral mutation(s) within the areas targeted by this assay, and inadequate number of viral copies (<131 copies/mL). A negative result must be combined with clinical observations, patient history, and epidemiological information. The expected result is Negative. Fact Sheet for Patients:  PinkCheek.be Fact Sheet for Healthcare Providers:  GravelBags.it This test is not yet ap proved or cleared by the [place] FDA and  has been authorized for detection and/or diagnosis of SARS-CoV-2 by FDA under an Emergency Use Authorization (EUA). This EUA will remain  in effect (meaning this test can be used) for the duration of the COVID-19 declaration under Section 564(b)(1) of the Act, 21 U.S.C. section 360bbb-3(b)(1), unless the authorization is terminated or revoked sooner.    Influenza A by PCR NEGATIVE  NEGATIVE Final   Influenza B by PCR NEGATIVE NEGATIVE Final    Comment: (NOTE) The Xpert Xpress SARS-CoV-2/FLU/RSV assay is intended as an aid in  the diagnosis of influenza from Nasopharyngeal swab specimens and  should not be used as a sole basis for treatment. Nasal washings and  aspirates are unacceptable for Xpert Xpress SARS-CoV-2/FLU/RSV  testing. Fact Sheet for Patients: PinkCheek.be Fact Sheet for Healthcare Providers: GravelBags.it This test is not yet approved or cleared by the [place] FDA and  has been authorized for detection and/or diagnosis of SARS-CoV-2 by  FDA under an Emergency Use Authorization (EUA). This EUA will remain  in effect (meaning this test can be used) for the duration of the  Covid-19 declaration under Section 564(b)(1) of the Act, 21  U.S.C. section 360bbb-3(b)(1), unless the authorization is  terminated or revoked. Performed at [facility], [address] [place], [place] [address]   MRSA PCR Screening     Status: Abnormal   Collection Time: 05/06/19 11:21 AM   Specimen: Nasal Mucosa; Nasopharyngeal  Result Value Ref Range Status   MRSA by PCR POSITIVE (A) NEGATIVE Final    Comment:        The GeneXpert MRSA Assay (FDA approved for NASAL specimens only), is one component of a comprehensive MRSA colonization surveillance program. It is not intended to diagnose MRSA infection nor to guide or monitor treatment for MRSA infections. RESULT CALLED TO, READ BACK BY AND VERIFIED WITH: ERICA MORALES AT 1308 05/06/19 SDR Performed at [facility], [address] [place], [place] [address]          Radiology Studies: ECHOCARDIOGRAM COMPLETE  Result Date: 05/06/2019    ECHOCARDIOGRAM REPORT   Patient Name:   MARELY APGAR Date of Exam: 05/06/2019 Medical Rec #:  [id number]  Height:       65.0 in Accession #:    [id number]        Weight:       238.3 lb  Date of Birth:  08-22-1955         BSA:          2.131 m Patient Age:    73 years          BP:           135/67 mmHg Patient Gender: F                 HR:           74 bpm. Exam Location:  [facility] Procedure: 2D Echo, Color Doppler and Cardiac Doppler Indications:     Tricuspid valve disease 107.9  History:         Patient has prior history of Echocardiogram examinations, most                  recent 10/29/2018. CHF, COPD; Risk Factors:Diabetes. NSTEMI.  Sonographer:     Sherrie Sport RDCS (AE) Referring Phys:  [place] Diagnosing Phys: Kathlyn Sacramento MD IMPRESSIONS  1. Left ventricular ejection fraction, by estimation, is 50 to 55%. The left ventricle has low normal function. The left ventricle has no regional wall motion abnormalities. There is moderate left ventricular hypertrophy. Left ventricular diastolic parameters are consistent with Grade II diastolic dysfunction (pseudonormalization).  2. Right ventricular systolic function is normal. The right ventricular size is normal. There is moderately elevated pulmonary artery systolic pressure.  3. Left atrial size was moderately dilated.  4. Right atrial size was mildly dilated.  5. The mitral valve is abnormal. Moderate mitral valve regurgitation. No evidence of mitral stenosis.  6. Tricuspid valve regurgitation is moderate.  7. The aortic valve is normal in structure. Aortic valve regurgitation is not visualized. Mild aortic valve stenosis. FINDINGS  Left Ventricle: Left ventricular ejection fraction, by estimation, is 50 to 55%. The left ventricle has low normal function. The left ventricle has no regional wall motion abnormalities. The left ventricular internal cavity size was normal in size. There is moderate left ventricular hypertrophy. Left ventricular diastolic parameters are consistent with Grade II diastolic dysfunction (pseudonormalization). Right Ventricle: The right ventricular size is normal. No increase in right ventricular wall thickness. Right  ventricular systolic function is normal. There is moderately elevated pulmonary artery systolic pressure. The tricuspid regurgitant velocity is 2.86 m/s, and with an assumed right atrial pressure of 10 mmHg, the estimated right ventricular systolic pressure is 81.4 mmHg. Left Atrium: Left atrial size was moderately dilated. Right Atrium: Right atrial size was mildly dilated. Pericardium: There is no evidence of pericardial effusion. Mitral Valve: The mitral valve is abnormal. There is severe thickening of the mitral valve leaflet(s). There is severe calcification of the mitral valve leaflet(s). Normal mobility of the mitral valve leaflets. Moderate mitral annular calcification. Moderate mitral valve regurgitation. No evidence of mitral valve stenosis. Tricuspid Valve: The tricuspid valve is normal in structure. Tricuspid valve regurgitation is moderate . No evidence of tricuspid stenosis. Aortic Valve: The aortic valve is normal in structure. Aortic valve regurgitation is not visualized. Mild aortic stenosis is present. Aortic valve mean gradient measures 7.3 mmHg. Aortic valve peak gradient measures 12.6 mmHg. Aortic valve area, by VTI measures 1.38 cm. Pulmonic Valve: The pulmonic valve was normal in structure. Pulmonic valve regurgitation is mild. No evidence of pulmonic stenosis. Aorta: The aortic root is normal  in size and structure. Venous: The inferior vena cava was not well visualized. IAS/Shunts: No atrial level shunt detected by color flow Doppler.  LEFT VENTRICLE PLAX 2D LVIDd:         4.62 cm  Diastology LVIDs:         3.28 cm  LV e' lateral:   8.92 cm/s LV PW:         1.51 cm  LV E/e' lateral: 16.3 LV IVS:        1.48 cm  LV e' medial:    5.44 cm/s LVOT diam:     2.00 cm  LV E/e' medial:  26.7 LV SV:         50 LV SV Index:   23 LVOT Area:     3.14 cm  RIGHT VENTRICLE RV Basal diam:  4.16 cm RV S prime:     5.22 cm/s TAPSE (M-mode): 3.0 cm LEFT ATRIUM              Index       RIGHT ATRIUM            Index LA diam:        4.50 cm  2.11 cm/m  RA Area:     21.60 cm LA Vol (A2C):   115.0 ml 53.96 ml/m RA Volume:   67.40 ml  31.63 ml/m LA Vol (A4C):   44.8 ml  21.02 ml/m LA Biplane Vol: 80.1 ml  37.59 ml/m  AORTIC VALVE                    PULMONIC VALVE AV Area (Vmax):    1.19 cm     PV Vmax:        0.64 m/s AV Area (Vmean):   1.20 cm     PV Peak grad:   1.6 mmHg AV Area (VTI):     1.38 cm     RVOT Peak grad: 3 mmHg AV Vmax:           177.67 cm/s AV Vmean:          124.333 cm/s AV VTI:            0.359 m AV Peak Grad:      12.6 mmHg AV Mean Grad:      7.3 mmHg LVOT Vmax:         67.20 cm/s LVOT Vmean:        47.300 cm/s LVOT VTI:          0.158 m LVOT/AV VTI ratio: 0.44  AORTA Ao Root diam: 2.00 cm MITRAL VALVE                TRICUSPID VALVE MV Area (PHT): 3.12 cm     TR Peak grad:   32.7 mmHg MV Decel Time: 243 msec     TR Vmax:        286.00 cm/s MV E velocity: 145.00 cm/s MV A velocity: 71.80 cm/s   SHUNTS MV E/A ratio:  2.02         Systemic VTI:  0.16 m                             Systemic Diam: 2.00 cm Kathlyn Sacramento MD Electronically signed by Kathlyn Sacramento MD Signature Date/Time: 05/06/2019/11:59:18 AM    Final       Scheduled Meds: . sodium chloride   Intravenous Once  . atorvastatin  40  mg Oral Daily  . carvedilol  3.125 mg Oral BID WC  . Chlorhexidine Gluconate Cloth  6 each Topical Q0600  . enoxaparin (LOVENOX) injection  40 mg Subcutaneous Q24H  . feeding supplement (PRO-STAT SUGAR FREE 64)  30 mL Oral BID  . fluticasone  2 spray Each Nare Daily  . folic acid  1 mg Oral Daily  . hydrocerin   Topical TID  . mirtazapine  30 mg Oral QHS  . multivitamin with minerals  1 tablet Oral Daily  . mupirocin ointment  1 application Nasal BID  . pregabalin  75 mg Oral BID  . sodium chloride flush  3 mL Intravenous Q12H   Continuous Infusions: . sodium chloride       LOS: 3 days      Debbe Odea, MD [facility] Pager: [email or web address] 05/07/2019, 3:48 PM

## 2019-05-07 NOTE — Progress Notes (Addendum)
PROGRESS NOTE    [name]   OHF:[id number]  DOB: February 24, 1955  DOA: 05/04/2019 PCP: [place]   Brief Narrative:  [name]  is a 64 y.o. female with medical history of chronic systolic heart failure (last echo however shows an EF of 50 to 55%) with severe tricuspid regurgitation, GI bleed in the past due to AVMs status post APC with anemia, diabetes mellitus, cervical cancer, hypertension, CKD 3, HCV antibody positive and amputation of left great toe.  Patient states that she was in the [facility] weeks ago and since she has been home she has been gaining fluid mainly in her abdomen and in her legs despite taking her torsemide.She has become progressively short of breath now shortness of breath is severe.  She states she stopped taking her torsemide 2 days ago because she was beginning to feel "bad".  She states that she does not drink enough water and probably needs to drink more water.  She states that she does not take a high sodium diet.  She has no severe cough.  She has a mild sore throat postnasal drip and a mild cough. In the ED she is also noted to be anemic.  She has not noted any blood in her stools or any black stools.  She has not vomited up any blood.  Noted to have a Hb of 6.4. On recheck in ED it was 5.7. ordered 1 U PRBC and IV Lasix.   Subjective:  No complaints today.     Assessment & Plan:   Dyspnea on exertion-right-sided heart failure with swollen abdomen and lower extremities -This is secondary to the anemia but also to significant fluid overload and weight gain-her weight was - weights> 199 pounds on 02/26/2019 and is 244 pounds in ED -Mild central vascular congestion on chest x-ray but no overt pulmonary edema - Legs are weeping and tender due to edema   - venous duplex ordered by night physician neg for DVT -  she does recall that she has been taking 1 tablet of torsemide a day up until 2 days prior to admission but could not recall  if she was taking spironolactone- -2D echo 10/2018-EF 50 to 55%, mild LVH, moderate mitral regurgitation, Moderate - severe tricuspid regurgitation by prior echocardiogram -Started on IV Lasix 60 mg Q6  - repeat echo- Ef 50-55%, Grade 2 d cHF, mod MR and mod TR - states she is no longer short of breath - d/c Lasix on 4/19 as Cr has risen significantly - weights> 199 pounds on 02/26/2019> 244 pounds in ED>> 227 >>238>>245 - weights are inaccurate- I am trying to have RN do a standing weight - pedal edema has resolved as of 4/19-     Symptomatic anemia. microcytosis - hb 6.4 and the 5.7 in ED - H/o Iron deficiency anemia due to chronic blood loss - hemoccult neg in ED - 12/21/18> EGD normal -01/06/19- capsule 12/8> fresh blood in cecum  - 01/08/19>  > Two bleeding colonic angioectasias. Fresh blood and clots in colon & hemorrhoid - transfused 1 U PRBC brought Hb up to 6.7- 1 more unit given- Hb now 7.7 - anemia panel 4/18 - recommend> follow Hb every month for now   11  Iron    UIBC  287  UIBC   TIBC  298  TIBC   Saturation Ratios  4  Saturation Ratios   Ferritin  24  Ferritin   Folate  28.0  Folate  Vit B12 604  CKD stage IIIb - slight Cr bump from 1.66 to 2.26 with Lasix- stopped Lasix 4/19- cr slightly higher today- follow- she appears to be eating and drinking well  HCV antibody positive-cirrhosis -HCV antibody reactive on 10/27/2018 -Abnormal ultrasound from 10/27/2018 revealed liver texture that was consistent with cirrhosis  -she stated that she was gaining fluid in her stomach but ultrasound to look for ascites on 4/17 did not show any ascites  Acquired coagulopathy due to vit K deficiency -PT is elevated at 16.6, INR is 1.4- ?  Due to cirrhosis-we will start vitamin K- following INR - PTT is normal - 4/19- INR 1.2 now  Obesity Body mass index is 36.07 kg/m.  Postnasal drip/seasonal allergies - Start Flonase    Neuropathy ? -Continue Lyrica which she does  state that she takes at home  HTN - Coreg with holding parameters  Venous ulcers on legs, pressure ulcer - f/u wound care eval Pressure Injury 05/05/19 Thigh Distal;Left;Posterior Unstageable - Full thickness tissue loss in which the base of the injury is covered by slough (yellow, tan, gray, green or brown) and/or eschar (tan, brown or black) in the wound bed. 4"x4" area with m (Active)  05/05/19 0700  Location: Thigh  Location Orientation: Distal;Left;Posterior  Staging: Unstageable - Full thickness tissue loss in which the base of the injury is covered by slough (yellow, tan, gray, green or brown) and/or eschar (tan, brown or black) in the wound bed.  Wound Description (Comments): 4"x4" area with mixture of pink granulated wound bed and yellow slough.  Present on Admission: Yes      The Ed RN though he saw maggots coming out of her foot. No one else  has seen any as of yet.  Time spent in minutes: 30 DVT prophylaxis: Lovenox Code Status: Full code Family Communication:  Disposition Plan:  From home Anticipated d/c is to:  SNF- patient in agreement She is stable for d/c awaiting SNF Consultants:   none Procedures:   2 d ECHO 1. Left ventricular ejection fraction, by estimation, is 50 to 55%. The  left ventricle has low normal function. The left ventricle has no regional  wall motion abnormalities. There is moderate left ventricular hypertrophy.  Left ventricular diastolic  parameters are consistent with Grade II diastolic dysfunction  (pseudonormalization).  2. Right ventricular systolic function is normal. The right ventricular  size is normal. There is moderately elevated pulmonary artery systolic  pressure.  3. Left atrial size was moderately dilated.  4. Right atrial size was mildly dilated.  5. The mitral valve is abnormal. Moderate mitral valve regurgitation. No  evidence of mitral stenosis.  6. Tricuspid valve regurgitation is moderate.  7. The aortic  valve is normal in structure. Aortic valve regurgitation is  not visualized. Mild aortic valve stenosis.  Antimicrobials:  Anti-infectives (From admission, onward)   None       Objective: Vitals:   05/07/19 0507 05/07/19 0804 05/07/19 1200 05/07/19 1203  BP: 105/66 137/78  (!) 148/65  Pulse: 71 73 84 82  Resp: 20 18  18   Temp: (!) 97.5 F (36.4 C) 98.1 F (36.7 C)  98 F (36.7 C)  TempSrc: Oral Oral    SpO2: 99% 100% 99% 100%  Weight: 111.1 kg     Height:        Intake/Output Summary (Last 24 hours) at 05/07/2019 1338 Last data filed at 05/07/2019 1104 Gross per 24 hour  Intake 1080 ml  Output 1350 ml  Net -270 ml   Filed Weights   05/06/19 0212 05/06/19 0811 05/07/19 0507  Weight: 108.8 kg 108.1 kg 111.1 kg    Examination:  General exam: Appears comfortable  HEENT: PERRLA, oral mucosa moist, no sclera icterus or thrush Respiratory system: Clear to auscultation. Respiratory effort normal. Cardiovascular system: S1 & S2 heard,  + 2/6 murmur at sternal border Gastrointestinal system: Abdomen soft, non-tender, nondistended. Normal bowel sounds   Central nervous system: Alert and oriented. No focal neurological deficits. Extremities: No cyanosis, clubbing or edema- legs are still tender- skin wrinkled and thick Skin: No rashes- small ulcers on b/l legs due to edema are healing Psychiatry:  Mood & affect appropriate.   Data Reviewed: I have personally reviewed following labs and imaging studies  CBC: Recent Labs  Lab 05/04/19 1132 05/04/19 1516 05/05/19 0458 05/06/19 0818  WBC 7.2 6.9 6.8 8.1  HGB 6.4* 5.7* 6.7* 7.7*  HCT 23.4* 21.6* 24.2* 26.5*  MCV 76.5* 78.0* 79.9* 78.6*  PLT 317 296 294 488   Basic Metabolic Panel: Recent Labs  Lab 05/04/19 1132 05/04/19 1516 05/05/19 0458 05/06/19 0537 05/07/19 0418  NA 137  --  137 138 135  K 4.8  --  4.9 5.1 4.8  CL 109  --  110 110 108  CO2 20*  --  18* 19* 21*  GLUCOSE 167*  --  155* 124* 182*  BUN 22  --   24* 30* 33*  CREATININE 1.66* 1.81* 1.87* 2.26* 2.34*  CALCIUM 8.6*  --  8.3* 8.2* 7.8*   GFR: Estimated Creatinine Clearance: 30.1 mL/min (A) (by C-G formula based on SCr of 2.34 mg/dL (H)). Liver Function Tests: Recent Labs  Lab 05/04/19 1132  AST 16  ALT 9  ALKPHOS 124  BILITOT 1.0  PROT 8.9*  ALBUMIN 3.0*   No results for input(s): LIPASE, AMYLASE in the last 168 hours. No results for input(s): AMMONIA in the last 168 hours. Coagulation Profile: Recent Labs  Lab 05/04/19 1516 05/05/19 0458 05/06/19 0818 05/07/19 0418  INR 1.4* 1.4* 1.2 1.2   Cardiac Enzymes: No results for input(s): CKTOTAL, CKMB, CKMBINDEX, TROPONINI in the last 168 hours. BNP (last 3 results) No results for input(s): PROBNP in the last 8760 hours. HbA1C: No results for input(s): HGBA1C in the last 72 hours. CBG: No results for input(s): GLUCAP in the last 168 hours. Lipid Profile: No results for input(s): CHOL, HDL, LDLCALC, TRIG, CHOLHDL, LDLDIRECT in the last 72 hours. Thyroid Function Tests: Recent Labs    05/04/19 1516  TSH 2.089   Anemia Panel: Recent Labs    05/04/19 1516  VITAMINB12 604  FOLATE 28.0  FERRITIN 24  TIBC 298  IRON 11*  RETICCTPCT 2.9   Urine analysis:    Component Value Date/Time   COLORURINE YELLOW (A) 05/04/2019 1917   APPEARANCEUR HAZY (A) 05/04/2019 1917   APPEARANCEUR Clear 08/03/2013 2138   LABSPEC 1.010 05/04/2019 1917   LABSPEC 1.028 08/03/2013 2138   PHURINE 5.0 05/04/2019 [place] 05/04/2019 1917   GLUCOSEU Negative 08/03/2013 2138   HGBUR SMALL (A) 05/04/2019 [place] NEGATIVE 05/04/2019 1917   BILIRUBINUR Negative 08/03/2013 2138   [place] NEGATIVE 05/04/2019 1917   PROTEINUR 30 (A) 05/04/2019 1917   UROBILINOGEN 0.2 09/27/2006 2132   NITRITE POSITIVE (A) 05/04/2019 1917   LEUKOCYTESUR MODERATE (A) 05/04/2019 1917   LEUKOCYTESUR Negative 08/03/2013 2138   Sepsis  Labs: @LABRCNTIP (procalcitonin:4,lacticidven:4) ) Recent Results (from the past 240 hour(s))  Respiratory  Panel by RT PCR (Flu A&B, Covid) - Nasopharyngeal Swab     Status: None   Collection Time: 05/04/19  1:29 PM   Specimen: Nasopharyngeal Swab  Result Value Ref Range Status   SARS Coronavirus 2 by RT PCR NEGATIVE NEGATIVE Final    Comment: (NOTE) SARS-CoV-2 target nucleic acids are NOT DETECTED. The SARS-CoV-2 RNA is generally detectable in upper respiratoy specimens during the acute phase of infection. The lowest concentration of SARS-CoV-2 viral copies this assay can detect is 131 copies/mL. A negative result does not preclude SARS-Cov-2 infection and should not be used as the sole basis for treatment or other patient management decisions. A negative result may occur with  improper specimen collection/handling, submission of specimen other than nasopharyngeal swab, presence of viral mutation(s) within the areas targeted by this assay, and inadequate number of viral copies (<131 copies/mL). A negative result must be combined with clinical observations, patient history, and epidemiological information. The expected result is Negative. Fact Sheet for Patients:  PinkCheek.be Fact Sheet for Healthcare Providers:  GravelBags.it This test is not yet ap proved or cleared by the [place] FDA and  has been authorized for detection and/or diagnosis of SARS-CoV-2 by FDA under an Emergency Use Authorization (EUA). This EUA will remain  in effect (meaning this test can be used) for the duration of the COVID-19 declaration under Section 564(b)(1) of the Act, 21 U.S.C. section 360bbb-3(b)(1), unless the authorization is terminated or revoked sooner.    Influenza A by PCR NEGATIVE NEGATIVE Final   Influenza B by PCR NEGATIVE NEGATIVE Final    Comment: (NOTE) The Xpert Xpress SARS-CoV-2/FLU/RSV assay is intended as an aid in  the  diagnosis of influenza from Nasopharyngeal swab specimens and  should not be used as a sole basis for treatment. Nasal washings and  aspirates are unacceptable for Xpert Xpress SARS-CoV-2/FLU/RSV  testing. Fact Sheet for Patients: PinkCheek.be Fact Sheet for Healthcare Providers: GravelBags.it This test is not yet approved or cleared by the [place] FDA and  has been authorized for detection and/or diagnosis of SARS-CoV-2 by  FDA under an Emergency Use Authorization (EUA). This EUA will remain  in effect (meaning this test can be used) for the duration of the  Covid-19 declaration under Section 564(b)(1) of the Act, 21  U.S.C. section 360bbb-3(b)(1), unless the authorization is  terminated or revoked. Performed at [facility], [address] [place], [place] [address]   MRSA PCR Screening     Status: Abnormal   Collection Time: 05/06/19 11:21 AM   Specimen: Nasal Mucosa; Nasopharyngeal  Result Value Ref Range Status   MRSA by PCR POSITIVE (A) NEGATIVE Final    Comment:        The GeneXpert MRSA Assay (FDA approved for NASAL specimens only), is one component of a comprehensive MRSA colonization surveillance program. It is not intended to diagnose MRSA infection nor to guide or monitor treatment for MRSA infections. RESULT CALLED TO, READ BACK BY AND VERIFIED WITH: ERICA MORALES AT 1517 05/06/19 SDR Performed at [facility], [address], [place], [place] [address]          Radiology Studies: ECHOCARDIOGRAM COMPLETE  Result Date: 05/06/2019    ECHOCARDIOGRAM REPORT   Patient Name:   DENNISE RAABE Date of Exam: 05/06/2019 Medical Rec #:  [id number]         Height:       65.0 in Accession #:    [id number]  Weight:       238.3 lb Date of Birth:  [birth year]         BSA:          2.131 m Patient Age:    44 years          BP:           135/67 mmHg Patient Gender: F                 HR:            74 bpm. Exam Location:  [facility] Procedure: 2D Echo, Color Doppler and Cardiac Doppler Indications:     Tricuspid valve disease 107.9  History:         Patient has prior history of Echocardiogram examinations, most                  recent 10/29/2018. CHF, COPD; Risk Factors:Diabetes. NSTEMI.  Sonographer:     Sherrie Sport RDCS (AE) Referring Phys:  [place] Diagnosing Phys: Kathlyn Sacramento MD IMPRESSIONS  1. Left ventricular ejection fraction, by estimation, is 50 to 55%. The left ventricle has low normal function. The left ventricle has no regional wall motion abnormalities. There is moderate left ventricular hypertrophy. Left ventricular diastolic parameters are consistent with Grade II diastolic dysfunction (pseudonormalization).  2. Right ventricular systolic function is normal. The right ventricular size is normal. There is moderately elevated pulmonary artery systolic pressure.  3. Left atrial size was moderately dilated.  4. Right atrial size was mildly dilated.  5. The mitral valve is abnormal. Moderate mitral valve regurgitation. No evidence of mitral stenosis.  6. Tricuspid valve regurgitation is moderate.  7. The aortic valve is normal in structure. Aortic valve regurgitation is not visualized. Mild aortic valve stenosis. FINDINGS  Left Ventricle: Left ventricular ejection fraction, by estimation, is 50 to 55%. The left ventricle has low normal function. The left ventricle has no regional wall motion abnormalities. The left ventricular internal cavity size was normal in size. There is moderate left ventricular hypertrophy. Left ventricular diastolic parameters are consistent with Grade II diastolic dysfunction (pseudonormalization). Right Ventricle: The right ventricular size is normal. No increase in right ventricular wall thickness. Right ventricular systolic function is normal. There is moderately elevated pulmonary artery systolic pressure. The tricuspid regurgitant velocity is 2.86 m/s, and  with an assumed right atrial pressure of 10 mmHg, the estimated right ventricular systolic pressure is 74.0 mmHg. Left Atrium: Left atrial size was moderately dilated. Right Atrium: Right atrial size was mildly dilated. Pericardium: There is no evidence of pericardial effusion. Mitral Valve: The mitral valve is abnormal. There is severe thickening of the mitral valve leaflet(s). There is severe calcification of the mitral valve leaflet(s). Normal mobility of the mitral valve leaflets. Moderate mitral annular calcification. Moderate mitral valve regurgitation. No evidence of mitral valve stenosis. Tricuspid Valve: The tricuspid valve is normal in structure. Tricuspid valve regurgitation is moderate . No evidence of tricuspid stenosis. Aortic Valve: The aortic valve is normal in structure. Aortic valve regurgitation is not visualized. Mild aortic stenosis is present. Aortic valve mean gradient measures 7.3 mmHg. Aortic valve peak gradient measures 12.6 mmHg. Aortic valve area, by VTI measures 1.38 cm. Pulmonic Valve: The pulmonic valve was normal in structure. Pulmonic valve regurgitation is mild. No evidence of pulmonic stenosis. Aorta: The aortic root is normal in size and structure. Venous: The inferior vena cava was not well visualized. IAS/Shunts: No atrial level shunt detected by color flow  Doppler.  LEFT VENTRICLE PLAX 2D LVIDd:         4.62 cm  Diastology LVIDs:         3.28 cm  LV e' lateral:   8.92 cm/s LV PW:         1.51 cm  LV E/e' lateral: 16.3 LV IVS:        1.48 cm  LV e' medial:    5.44 cm/s LVOT diam:     2.00 cm  LV E/e' medial:  26.7 LV SV:         50 LV SV Index:   23 LVOT Area:     3.14 cm  RIGHT VENTRICLE RV Basal diam:  4.16 cm RV S prime:     5.22 cm/s TAPSE (M-mode): 3.0 cm LEFT ATRIUM              Index       RIGHT ATRIUM           Index LA diam:        4.50 cm  2.11 cm/m  RA Area:     21.60 cm LA Vol (A2C):   115.0 ml 53.96 ml/m RA Volume:   67.40 ml  31.63 ml/m LA Vol (A4C):   44.8  ml  21.02 ml/m LA Biplane Vol: 80.1 ml  37.59 ml/m  AORTIC VALVE                    PULMONIC VALVE AV Area (Vmax):    1.19 cm     PV Vmax:        0.64 m/s AV Area (Vmean):   1.20 cm     PV Peak grad:   1.6 mmHg AV Area (VTI):     1.38 cm     RVOT Peak grad: 3 mmHg AV Vmax:           177.67 cm/s AV Vmean:          124.333 cm/s AV VTI:            0.359 m AV Peak Grad:      12.6 mmHg AV Mean Grad:      7.3 mmHg LVOT Vmax:         67.20 cm/s LVOT Vmean:        47.300 cm/s LVOT VTI:          0.158 m LVOT/AV VTI ratio: 0.44  AORTA Ao Root diam: 2.00 cm MITRAL VALVE                TRICUSPID VALVE MV Area (PHT): 3.12 cm     TR Peak grad:   32.7 mmHg MV Decel Time: 243 msec     TR Vmax:        286.00 cm/s MV E velocity: 145.00 cm/s MV A velocity: 71.80 cm/s   SHUNTS MV E/A ratio:  2.02         Systemic VTI:  0.16 m                             Systemic Diam: 2.00 cm Kathlyn Sacramento MD Electronically signed by Kathlyn Sacramento MD Signature Date/Time: 05/06/2019/11:59:18 AM    Final       Scheduled Meds: . sodium chloride   Intravenous Once  . atorvastatin  40 mg Oral Daily  . carvedilol  3.125 mg Oral BID WC  . Chlorhexidine Gluconate Cloth  6 each Topical Q0600  .  enoxaparin (LOVENOX) injection  40 mg Subcutaneous Q24H  . fluticasone  2 spray Each Nare Daily  . hydrocerin   Topical TID  . mupirocin ointment  1 application Nasal BID  . pregabalin  150 mg Oral BID  . sodium chloride flush  3 mL Intravenous Q12H   Continuous Infusions: . sodium chloride       LOS: 3 days      Debbe Odea, MD [facility] Pager: [email or web address] 05/07/2019, 1:38 PM

## 2019-05-07 NOTE — TOC Progression Note (Signed)
Transition of Care [facility]) - Progression Note    Patient Details  Name: [name] MRN: [id number] Date of Birth: 1955/07/15  [facility]) CM/SW [place], RN Phone Number: 05/07/2019, 3:00 PM  Clinical Narrative:    Met with patient in room for assessment and to tell her about the recommendation for SNF.  Patient stated she is extremely worried right this moment as her husband is downstairs in the ER and she would like me to come back to talk with her at a later time.   Reported to Bedside RN, patient's increase in anxiety.        Expected Discharge Plan and Services                                                 Social Determinants of Health (SDOH) Interventions    Readmission Risk Interventions Readmission Risk Prevention Plan 02/26/2019 12/17/2018 10/26/2018  Transportation Screening Complete Complete Complete  PCP or Specialist Appt within 5-7 Days - - -  Home Care Screening - - -  Medication Review (RN CM) - - -  HRI or [place] - - Complete  Social Work Consult for [place] Planning/Counseling - - Not Complete  SW consult not completed comments - - not recovery care  Palliative Care Screening - - Not Applicable  Medication Review (RN Care Manager) Complete Referral to Pharmacy Referral to Pharmacy  PCP or Specialist appointment within 3-5 days of discharge (No Data) Not Complete -  PCP/Specialist Appt Not Complete comments - DC date unknown but pt is established with providers -  [place] or Home Care Consult Complete Not Complete Complete  HRI or Home Care Consult Pt Refusal Comments - SNF resident -  SW Recovery Care/Counseling Consult - Complete -  Palliative Care Screening Not Applicable Not Complete Not Applicable  Comments - pending need -  [place] Patient Refused Complete -  Some recent data might be hidden

## 2019-05-07 NOTE — Progress Notes (Signed)
Physical Therapy Treatment Patient Details Name: [name] MRN: [id number] DOB: 1955/08/01 Today's Date: 05/07/2019    History of Present Illness [name] is a 28yoF who comes to [facility] on 4/17 c SOB. was in the [facility] 2 weeks ago and since she has been home she has been gaining fluid mainly in her abdomen and in her legs despite taking her torsemide.She has become progressively short of breath now shortness of breath is severe. Hb: 6.4 in ED.  PMH: chronic systolic heart failure (echo however shows an EF of 50 to 55%) with severe tricuspid regurgitation, GI bleed in the past due to AVMs status post APC with anemia, diabetes mellitus, cervical cancer, hypertension, CKD 3, HCV antibody positive and amputation of left great toe. Pt does walk short distances at home, but also heavily relies on [facility] for mobility, largely unable to self propel. Pt has a very low level of function for her age, even in light of medical complexity, but she does not seem motivated nor think it normal for her to be any more independent at "such an old age."    PT Comments    Pt sleeping lightly upon entry, easily awakens to voice is agreeable to participate. Pt continues to reports 8/10 pain in BLE, pt has firmness in legs comparable to previous day, but has increased post-diuresis wrinkling of skin at the level of the ankles. Pt does well with basic leg exercises in bed. Pt able to move to EOB without physical assistance, but requires near-max effort and 2 rest intervals. Pt unable to stand with MaxA +RW, but can rise with modA dependent transfer and then once in standing is transitioned to RW. Pt has one posterior sway LOB correct by author, then able to complete small steps to recliner. Pt left up in chair, all needs met.     Follow Up Recommendations  SNF;Supervision for mobility/OOB     Equipment Recommendations  None recommended by PT    Recommendations for Other Services       Precautions /  Restrictions Precautions Precautions: Fall Restrictions Weight Bearing Restrictions: No    Mobility  Bed Mobility Overal bed mobility: Needs Assistance Bed Mobility: Supine to Sit;Sit to Supine     Supine to sit: Min assist     General bed mobility comments: 95% done by patient, 2 rest intervals provided; author uses draw sheet to facilitate forward scoot on left  Transfers Overall transfer level: Needs assistance Equipment used: Rolling walker (2 wheeled) Transfers: Sit to/from [facility] Sit to Stand: Max assist(2 attempts, unable c RW.) Stand pivot transfers: Mod assist(modA to standing, then transitioned to RW while up, then able to take some steps to recliner, 1 posterior sway LOB.)          Ambulation/Gait                 Stairs             Wheelchair Mobility    Modified Rankin (Stroke Patients Only)       Balance                                            Cognition Arousal/Alertness: Awake/alert Behavior During Therapy: WFL for tasks assessed/performed Overall Cognitive Status: Within Functional Limits for tasks assessed  Exercises General Exercises - Lower Extremity Ankle Circles/Pumps: AROM;15 reps;Supine;Limitations Ankle Circles/Pumps Limitations: ROM is very limited Short Arc Quad: AROM;Both;15 reps;Supine Heel Slides: AAROM;Both;15 reps;Supine Hip ABduction/ADduction: AAROM;Both;15 reps;Supine    General Comments        Pertinent Vitals/Pain Pain Assessment: 0-10 Pain Score: 8  Pain Location: BLE Pain Intervention(s): Limited activity within patient's tolerance;Monitored during session;RN gave pain meds during session    Home Living                      Prior Function            PT Goals (current goals can now be found in the care plan section) Acute Rehab PT Goals Patient Stated Goal: decrease leg pain PT Goal  Formulation: With patient Time For Goal Achievement: 05/20/19 Potential to Achieve Goals: Fair Progress towards PT goals: Progressing toward goals    Frequency    Min 2X/week      PT Plan Current plan remains appropriate    Co-evaluation              AM-PAC PT "6 Clicks" Mobility   Outcome Measure  Help needed turning from your back to your side while in a flat bed without using bedrails?: Total Help needed moving from lying on your back to sitting on the side of a flat bed without using bedrails?: Total Help needed moving to and from a bed to a chair (including a wheelchair)?: Total Help needed standing up from a chair using your arms (e.g., wheelchair or bedside chair)?: Total Help needed to walk in hospital room?: Total Help needed climbing 3-5 steps with a railing? : Total 6 Click Score: 6    End of Session   Activity Tolerance: Patient limited by fatigue;Patient tolerated treatment well Patient left: in bed;with call bell/phone within reach;with family/visitor present;with bed alarm set Nurse Communication: Mobility status PT Visit Diagnosis: Difficulty in walking, not elsewhere classified (R26.2);Other abnormalities of gait and mobility (R26.89)     Time: 1761-6073 PT Time Calculation (min) (ACUTE ONLY): 24 min  Charges:  $Therapeutic Exercise: 23-37 mins                12:53 PM, 05/07/19 Etta Grandchild, PT, DPT Physical Therapist - [facility]  [phone] ([place])   Roy Tokarz C 05/07/2019, 12:45 PM

## 2019-05-08 LAB — BASIC METABOLIC PANEL
Anion gap: 8 (ref 5–15)
BUN: 36 mg/dL — ABNORMAL HIGH (ref 8–23)
CO2: 21 mmol/L — ABNORMAL LOW (ref 22–32)
Calcium: 8.2 mg/dL — ABNORMAL LOW (ref 8.9–10.3)
Chloride: 107 mmol/L (ref 98–111)
Creatinine, Ser: 2.08 mg/dL — ABNORMAL HIGH (ref 0.44–1.00)
GFR calc Af Amer: 28 mL/min — ABNORMAL LOW (ref >60)
GFR calc non Af Amer: 25 mL/min — ABNORMAL LOW (ref >60)
Glucose, Bld: 154 mg/dL — ABNORMAL HIGH (ref 70–99)
Potassium: 5.4 mmol/L — ABNORMAL HIGH (ref 3.5–5.1)
Sodium: 136 mmol/L (ref 135–145)

## 2019-05-08 LAB — GLUCOSE, CAPILLARY
Glucose-Capillary: 134 mg/dL — ABNORMAL HIGH (ref 70–99)
Glucose-Capillary: 195 mg/dL — ABNORMAL HIGH (ref 70–99)
Glucose-Capillary: 186 mg/dL — ABNORMAL HIGH (ref 70–99)
Glucose-Capillary: 166 mg/dL — ABNORMAL HIGH (ref 70–99)

## 2019-05-08 LAB — HEMOGLOBIN A1C
Hgb A1c MFr Bld: 6.8 % — ABNORMAL HIGH (ref 4.8–5.6)
Mean Plasma Glucose: 148.46 mg/dL

## 2019-05-08 MED ORDER — COLLAGENASE 250 UNIT/GM EX OINT
TOPICAL_OINTMENT | Freq: Every day | CUTANEOUS | Status: DC
  Filled 2019-05-08: qty 30

## 2019-05-08 MED ORDER — INSULIN ASPART 100 UNIT/ML ~~LOC~~ SOLN
0.0000 [IU] | Freq: Three times a day (TID) | SUBCUTANEOUS | Status: DC
  Administered 2019-05-08 – 2019-05-09 (×3): 2 [IU] via SUBCUTANEOUS
  Administered 2019-05-09 (×2): 1 [IU] via SUBCUTANEOUS
  Administered 2019-05-10 (×2): 2 [IU] via SUBCUTANEOUS
  Filled 2019-05-08 (×7): qty 1

## 2019-05-08 MED ORDER — SODIUM ZIRCONIUM CYCLOSILICATE 10 G PO PACK
10.0000 g | PACK | Freq: Once | ORAL | Status: AC
  Administered 2019-05-08: 10 g via ORAL
  Filled 2019-05-08: qty 1

## 2019-05-08 MED ORDER — INSULIN ASPART 100 UNIT/ML ~~LOC~~ SOLN: 0.0000 [IU] | Freq: Every day | SUBCUTANEOUS | Status: DC

## 2019-05-08 NOTE — Consult Note (Signed)
WOC Nurse Consult Note: Reason for Consult: Unstageable pressure and moisture injury to left posterior thigh.  Patient with recent fluid overload and generalized edema. Slough to wound bed Wound type: Moisture and pressure  Pressure Injury POA:NA Measurement:  4 cm x 4 cm with slough to wound bed Wound TCC:EQFDVO Drainage (amount, consistency, odor) minimal purulence with no odor.  Periwound:frequently moist with increasing edema Dressing procedure/placement/frequency: Cleanse wounds to left posterior thigh with NS.  Apply Santyl to wound bed. Cover with NS moist gauze.  Secure with dry gauze and ABD pad. Change daily.  Will not follow at this time.  Please re-consult if needed.  Domenic Moras MSN, RN, FNP-BC CWON Wound, Ostomy, Continence Nurse Pager [phone]

## 2019-05-08 NOTE — Progress Notes (Signed)
[name] at [place] NAME: [name]    MR#:  [id number]  DATE OF BIRTH:  02-Jun-1955  SUBJECTIVE:   Patient quite sleepy however did answer most of my questions appropriately. Ate good breakfast this morning. No other issues per RN. Patient breathing better.  on room air. REVIEW OF SYSTEMS:   Review of Systems  Constitutional: Negative for chills, fever and weight loss.  HENT: Negative for ear discharge, ear pain and nosebleeds.   Eyes: Negative for blurred vision, pain and discharge.  Respiratory: Negative for sputum production, shortness of breath, wheezing and stridor.   Cardiovascular: Negative for chest pain, palpitations, orthopnea and PND.  Gastrointestinal: Negative for abdominal pain, diarrhea, nausea and vomiting.  Genitourinary: Negative for frequency and urgency.  Musculoskeletal: Negative for back pain and joint pain.  Neurological: Positive for weakness. Negative for sensory change, speech change and focal weakness.  Psychiatric/Behavioral: Negative for depression and hallucinations. The patient is not nervous/anxious.    Tolerating Diet:yes Tolerating PT: recommends rehab--but wants to go home  DRUG ALLERGIES:   Allergies  Allergen Reactions  . Latex Anaphylaxis and Rash  . Gabapentin Nausea And Vomiting and Other (See Comments)    VITALS:  Blood pressure 124/66, pulse 76, temperature 98.1 F (36.7 C), resp. rate 19, height 5' 5"  (1.651 m), weight 108.7 kg, SpO2 100 %.  PHYSICAL EXAMINATION:   Physical Exam  GENERAL:  64 y.o.-year-old patient lying in the bed with no acute distress. Obese EYES: Pupils equal, round, reactive to light and accommodation. No scleral icterus.   HEENT: Head atraumatic, normocephalic. Oropharynx and nasopharynx clear.  NECK:  Supple, no jugular venous distention. No thyroid enlargement, no tenderness.  LUNGS: Normal breath sounds bilaterally, no wheezing, rales, rhonchi. No use  of accessory muscles of respiration.  CARDIOVASCULAR: S1, S2 normal. No murmurs, rubs, or gallops.  ABDOMEN: Soft, nontender, nondistended. Bowel sounds present. No organomegaly or mass.  EXTREMITIES: No cyanosis, clubbing  + edema b/l chronic with wrinkling skin now NEUROLOGIC: Cranial nerves II through XII are intact. No focal Motor or sensory deficits b/l.   PSYCHIATRIC:  patient is alert and oriented x 2  SKIN: No obvious rash, lesion, or ulcer.   LABORATORY PANEL:  CBC Recent Labs  Lab 05/06/19 0818  WBC 8.1  HGB 7.7*  HCT 26.5*  PLT 276    Chemistries  Recent Labs  Lab 05/04/19 1132 05/04/19 1516 05/08/19 0508  NA 137   < > 136  K 4.8   < > 5.4*  CL 109   < > 107  CO2 20*   < > 21*  GLUCOSE 167*   < > 154*  BUN 22   < > 36*  CREATININE 1.66*   < > 2.08*  CALCIUM 8.6*   < > 8.2*  AST 16  --   --   ALT 9  --   --   ALKPHOS 124  --   --   BILITOT 1.0  --   --    < > = values in this interval not displayed.   Cardiac Enzymes No results for input(s): TROPONINI in the last 168 hours. RADIOLOGY:  No results found. ASSESSMENT AND PLAN:  [name] is a 64 y.o.femalewith medical history ofchronic systolic heart failure(last echo however shows an EF of 50 to 55%) with severe tricuspid regurgitation, GI bleed in the past due to AVMs status post APC with anemia,diabetes mellitus, cervical cancer, hypertension, CKD  3, HCV antibody positive and amputation of left great toe.  Dyspnea on exertion-right-sided heart failure with swollen abdomen and lower extremities -This is secondary to the anemia but also to significant fluid overload and weight gain-her weight was - weights> 199 pounds on 02/26/2019 and is 244 pounds in ED -Mild central vascular congestion on chest x-ray but no overt pulmonary edema - Legs are weeping and tender due to edema - venous duplex ordered by night physician neg for DVT -2D echo 10/2018-EF 50 to 55%, mild LVH, moderate mitral  regurgitation,Moderate - severe tricuspid regurgitation by prior echocardiogram -Started on IV Lasix 60 mg Q6 --will resume torsemide from tomorrow - repeat echo- Ef 50-55%, Grade 2 d cHF, mod MR and mod TR - states she is no longer short of breath -  pedal edema has resolved as of 4/19- d/c Lasix on 4/19 as Cr has risen significantly--creat now 2.08--trending down. - weights> 199 pounds on 02/26/2019> 244 pounds in ED>> 103 kg>>108>.112>.108 kg - weights seem inaccurate as documented weight has gone up now going down -clinically stable. Off oxygen sats >92% on RA  Symptomatic anemia. microcytosis - hb 6.4 and the 5.7 in ED - H/o Iron deficiency anemia due to chronic blood loss - hemoccult neg in ED - 12/21/18> EGD normal -01/06/19- capsule 12/8> fresh blood in cecum  - 01/08/19>  >Two bleeding colonic angioectasias.Fresh blood and clots in colon & hemorrhoid - transfused 2 U PRBC-- Hb now 7.7 - recommend> follow Hb every month for now -Vit B12 604  CKD stage IIIb - slight Cr bump from 1.66 to 2.26 with Lasix- stopped Lasix 4/19- cr slightly higher today- follow- she appears to be eating and drinking well -creat down to 2.08  HCV antibody positive-cirrhosis -HCV antibody reactive on 10/27/2018 -Abnormal ultrasound from 10/27/2018 revealed liver texture that was consistent with cirrhosis  -she stated that she was gaining fluid in her stomach but ultrasound to look for ascites on 4/17 did not show any ascites  Acquired coagulopathy due to vit K deficiency -PT is elevated at 16.6, INR is 1.4- ?Due to cirrhosis-we will start vitamin K- following INR -PTT is normal - 4/19- INR 1.2 now  Obesity Body mass index is 36.07 kg/m.  Postnasal drip/seasonal allergies - cont Flonase    Neuropathy ? -Continue Lyrica which she does state that she takes at home- dose reduced from 150 to 75 mg due to current renal function and plt being sleepy  HTN - Coreg with holding  parameters  Venous ulcers on legs, pressure ulcer - f/u wound care eval Pressure Injury 05/05/19 Thigh Distal;Left;Posterior Unstageable - Full thickness tissue loss in which the base of the injury is covered by slough (yellow, tan, gray, green or brown) and/or eschar (tan, brown or black) in the wound bed. 4"x4" area with m (Active)  05/05/19 0700  Location: Thigh  Location Orientation: Distal;Left;Posterior  Staging: Unstageable - Full thickness tissue loss in which the base of the injury is covered by slough (yellow, tan, gray, green or brown) and/or eschar (tan, brown or black) in the wound bed.  Wound Description (Comments): 4"x4" area with mixture of pink granulated wound bed and yellow slough.  Present on Admission: Yes    DVT prophylaxis: Lovenox Code Status: Full code Family Communication: none Disposition Plan:  From home Anticipated d/c is to:  SNF- patient wants to go home and adamant about it. TOC aware. HH to be arranged. She is at high risk of readmisison  Consultants:  non  Status is: Inpatient Dispo: The patient is from: home              Anticipated d/c is to: home since pt is refusing rehab              Anticipated d/c date is: 4/22              Patient currently medically best at baseline   [place]: *30* minutes.  >50% time spent on counselling and coordination of care  Note: This dictation was prepared with Dragon dictation along with smaller phrase technology. Any transcriptional errors that result from this process are unintentional.  Fritzi Mandes M.D    [facility]   CC: Primary care physician; [facility], IncPatient ID: [name], female   DOB: May 17, 1955, 64 y.o.   MRN: [id number]

## 2019-05-08 NOTE — TOC Progression Note (Signed)
Transition of Care [facility]) - Progression Note    Patient Details  Name: [name] MRN: [id number] Date of Birth: 05-05-1955  Transition of Care [facility] CM/SW [place], RN Phone Number: 05/08/2019, 2:33 PM  Clinical Narrative:      Patient refusing short term rehab and wants to be discharged home with [facility] services.  Spoke with husband and he reports [facility] has been providing services to both he and his wife.  I have left a message with Colletta [place] with [facility] to verify information and see if they can still provide services at discharge.    Expected Discharge Plan: [place] Barriers to Discharge: Continued Medical Work up  Expected Discharge Plan and Services Expected Discharge Plan: [place]   Discharge Planning Services: CM Consult Post Acute Care Choice: [place] arrangements for the past 2 months: Single Family Home                                       Social Determinants of Health (SDOH) Interventions    Readmission Risk Interventions Readmission Risk Prevention Plan 02/26/2019 12/17/2018 10/26/2018  Transportation Screening Complete Complete Complete  PCP or Specialist Appt within 5-7 Days - - -  Home Care Screening - - -  Medication Review (RN CM) - - -  [place]RI or [place] - - Complete  Social Work Consult for [place] Planning/Counseling - - Not Complete  SW consult not completed comments - - not recovery care  Palliative Care Screening - - Not Applicable  Medication Review (RN Care Manager) Complete Referral to Pharmacy Referral to Pharmacy  PCP or Specialist appointment within 3-5 days of discharge (No Data) Not Complete -  PCP/Specialist Appt Not Complete comments - DC date unknown but pt is established with providers -  [place] or Home Care Consult Complete Not Complete Complete  HRI or Home Care Consult Pt Refusal Comments - SNF resident -  SW Recovery  Care/Counseling Consult - Complete -  Palliative Care Screening Not Applicable Not Complete Not Applicable  Comments - pending need -  [place] Patient Refused Complete -  Some recent data might be hidden

## 2019-05-09 LAB — POTASSIUM: Potassium: 5 mmol/L (ref 3.5–5.1)

## 2019-05-09 LAB — GLUCOSE, CAPILLARY
Glucose-Capillary: 142 mg/dL — ABNORMAL HIGH (ref 70–99)
Glucose-Capillary: 124 mg/dL — ABNORMAL HIGH (ref 70–99)
Glucose-Capillary: 177 mg/dL — ABNORMAL HIGH (ref 70–99)
Glucose-Capillary: 163 mg/dL — ABNORMAL HIGH (ref 70–99)

## 2019-05-09 LAB — BASIC METABOLIC PANEL
Anion gap: 7 (ref 5–15)
BUN: 39 mg/dL — ABNORMAL HIGH (ref 8–23)
CO2: 22 mmol/L (ref 22–32)
Calcium: 8.2 mg/dL — ABNORMAL LOW (ref 8.9–10.3)
Chloride: 108 mmol/L (ref 98–111)
Creatinine, Ser: 2.03 mg/dL — ABNORMAL HIGH (ref 0.44–1.00)
GFR calc Af Amer: 29 mL/min — ABNORMAL LOW (ref >60)
GFR calc non Af Amer: 25 mL/min — ABNORMAL LOW (ref >60)
Glucose, Bld: 162 mg/dL — ABNORMAL HIGH (ref 70–99)
Potassium: 5.4 mmol/L — ABNORMAL HIGH (ref 3.5–5.1)
Sodium: 137 mmol/L (ref 135–145)

## 2019-05-09 MED ORDER — TORSEMIDE 20 MG PO TABS
20.0000 mg | ORAL_TABLET | Freq: Every day | ORAL | Status: DC
  Administered 2019-05-09 – 2019-05-10 (×2): 20 mg via ORAL
  Filled 2019-05-09 (×3): qty 1

## 2019-05-09 MED ORDER — LACTULOSE 10 GM/15ML PO SOLN
30.0000 g | Freq: Once | ORAL | Status: AC
  Administered 2019-05-09: 30 g via ORAL
  Filled 2019-05-09: qty 60

## 2019-05-09 MED ORDER — SODIUM ZIRCONIUM CYCLOSILICATE 10 G PO PACK
10.0000 g | PACK | Freq: Once | ORAL | Status: AC
  Administered 2019-05-09: 10 g via ORAL
  Filled 2019-05-09: qty 1

## 2019-05-09 NOTE — Progress Notes (Signed)
Physical Therapy Treatment Patient Details Name: [name] MRN: [id number] DOB: 03-27-1955 Today's Date: 05/09/2019    History of Present Illness [name] is a 33yoF who comes to [facility] on 4/17 c SOB. was in the [facility] 2 weeks ago and since she has been home she has been gaining fluid mainly in her abdomen and in her legs despite taking her torsemide.She has become progressively short of breath now shortness of breath is severe. Hb: 6.4 in ED.  PMH: chronic systolic heart failure (echo however shows an EF of 50 to 55%) with severe tricuspid regurgitation, GI bleed in the past due to AVMs status post APC with anemia, diabetes mellitus, cervical cancer, hypertension, CKD 3, HCV antibody positive and amputation of left great toe. Pt does walk short distances at home, but also heavily relies on [facility] for mobility, largely unable to self propel. Pt has a very low level of function for her age, even in light of medical complexity, but she does not seem motivated nor think it normal for her to be any more independent at "such an old age."    PT Comments    Pt was asleep in supine upon arriving. She easily awakes and agrees to PT session with encouragement. Therapist discussed DC disposition and pt continues to be set on DC straight to home. Therapist educated on benefit on going SNF prior to home however pt continues to refuse. She required min assist for upper body progression with supine to sit but max assist to return to supine from EOB activity.  Pt becomes short of breath with minimal activity throughout session with a lot of prolonged rest breaks. SAO2 > 94% throughout and HR below 98 bpm. Pt stood from elevated bed height to RW with vcs for technique, fwd wt shift, and hand placement. He tolerated standing ~ 1 minute 2 x prior to fatigue and needing seated rest. She did take 3 steps towards HOB laterally with min assist. Poor foot clearance and LE progression 2/2 to fatigue. Once pt was  repositioned in supine, again therapist encouraged REHAB option but pt continues to decline. Acute PT will continue to follow per current POC progressing as able per pt tolerance.     Follow Up Recommendations  SNF;Supervision for mobility/OOB     Equipment Recommendations  None recommended by PT    Recommendations for Other Services       Precautions / Restrictions Precautions Precautions: Fall Restrictions Weight Bearing Restrictions: No    Mobility  Bed Mobility Overal bed mobility: Needs Assistance Bed Mobility: Supine to Sit;Sit to Supine     Supine to sit: Min assist Sit to supine: Mod assist   General bed mobility comments: Pt required min assist to exit bed but mod assist to return to supine. assistance with upperbody progression with getting OOB but require BLE assistance with returning to supine  Transfers Overall transfer level: Needs assistance Equipment used: Rolling walker (2 wheeled) Transfers: Sit to/from Stand Sit to Stand: Mod assist;From elevated surface         General transfer comment: Pt was able to STS EOB 2 x ~ 1 minute each trial. Pt fatigues quickly and has SOB with minimal activity. HR and sao2 stable throughout.  Ambulation/Gait Ambulation/Gait assistance: Min assist Gait Distance (Feet): 3 Feet Assistive device: Rolling walker (2 wheeled) Gait Pattern/deviations: Step-to pattern Gait velocity: decreased   General Gait Details: pt was able to take 3 steps along EOB to [facility] with mmin assist + increased  time. Pt has poor foot clearance and extremely fatigued with SOB noted.   Stairs             Wheelchair Mobility    Modified Rankin (Stroke Patients Only)       Balance                                            Cognition Arousal/Alertness: Awake/alert;Lethargic Behavior During Therapy: WFL for tasks assessed/performed Overall Cognitive Status: Within Functional Limits for tasks assessed                                  General Comments: Pt was asleep upon arriving but did easily awake. She does agree to PT session and is motivated. oriented to situation but not time/day      Exercises      General Comments        Pertinent Vitals/Pain Pain Assessment: No/denies pain Pain Score: 0-No pain Pain Location: BLE Pain Intervention(s): Limited activity within patient's tolerance;Monitored during session;Premedicated before session    Home Living                      Prior Function            PT Goals (current goals can now be found in the care plan section) Acute Rehab PT Goals Patient Stated Goal: Go home Progress towards PT goals: Progressing toward goals    Frequency    Min 2X/week      PT Plan Current plan remains appropriate    Co-evaluation              AM-PAC PT "6 Clicks" Mobility   Outcome Measure  Help needed turning from your back to your side while in a flat bed without using bedrails?: A Lot Help needed moving from lying on your back to sitting on the side of a flat bed without using bedrails?: A Lot Help needed moving to and from a bed to a chair (including a wheelchair)?: A Lot Help needed standing up from a chair using your arms (e.g., wheelchair or bedside chair)?: A Lot Help needed to walk in hospital room?: A Lot Help needed climbing 3-5 steps with a railing? : Total 6 Click Score: 11    End of Session Equipment Utilized During Treatment: Gait belt Activity Tolerance: Patient limited by fatigue Patient left: in bed;with call bell/phone within reach;with bed alarm set Nurse Communication: Mobility status PT Visit Diagnosis: Difficulty in walking, not elsewhere classified (R26.2);Other abnormalities of gait and mobility (R26.89)     Time: 2025-4270 PT Time Calculation (min) (ACUTE ONLY): 24 min  Charges:  $Therapeutic Activity: 23-37 mins                     Julaine Fusi PTA 05/09/19, 4:34 PM

## 2019-05-09 NOTE — Progress Notes (Signed)
[place] [facility] [place] NAME: [name]    MR#:  [id number]  DATE OF BIRTH:  29-Aug-1955  SUBJECTIVE:   Patient quite sleepy however did answer most of my questions appropriately. Ate good breakfast this morning. No other issues per RN. Patient breathing better. Asking me how her husband is doing who is admitted on room air. REVIEW OF SYSTEMS:   Review of Systems  Constitutional: Negative for chills, fever and weight loss.  HENT: Negative for ear discharge, ear pain and nosebleeds.   Eyes: Negative for blurred vision, pain and discharge.  Respiratory: Negative for sputum production, shortness of breath, wheezing and stridor.   Cardiovascular: Negative for chest pain, palpitations, orthopnea and PND.  Gastrointestinal: Negative for abdominal pain, diarrhea, nausea and vomiting.  Genitourinary: Negative for frequency and urgency.  Musculoskeletal: Negative for back pain and joint pain.  Neurological: Positive for weakness. Negative for sensory change, speech change and focal weakness.  Psychiatric/Behavioral: Negative for depression and hallucinations. The patient is not nervous/anxious.    Tolerating Diet:yes Tolerating PT: recommends rehab--but wants to go home  DRUG ALLERGIES:   Allergies  Allergen Reactions  . Latex Anaphylaxis and Rash  . Gabapentin Nausea And Vomiting and Other (See Comments)    VITALS:  Blood pressure (!) 153/82, pulse 74, temperature 98.4 F (36.9 C), resp. rate 18, height 5' 5"  (1.651 m), weight 111.4 kg, SpO2 100 %.  PHYSICAL EXAMINATION:   Physical Exam  GENERAL:  64 y.o.-year-old patient lying in the bed with no acute distress. Obese EYES: Pupils equal, round, reactive to light and accommodation. No scleral icterus.   HEENT: Head atraumatic, normocephalic. Oropharynx and nasopharynx clear.  NECK:  Supple, no jugular venous distention. No thyroid enlargement, no tenderness.  LUNGS: Normal breath  sounds bilaterally, no wheezing, rales, rhonchi. No use of accessory muscles of respiration.  CARDIOVASCULAR: S1, S2 normal. No murmurs, rubs, or gallops.  ABDOMEN: Soft, nontender, nondistended. Bowel sounds present. No organomegaly or mass.  EXTREMITIES: No cyanosis, clubbing  + edema b/l chronic with wrinkling skin now NEUROLOGIC: Cranial nerves II through XII are intact. No focal Motor or sensory deficits b/l.   PSYCHIATRIC:  patient is alert and oriented x 2  SKIN: No obvious rash, lesion, or ulcer.   LABORATORY PANEL:  CBC Recent Labs  Lab 05/06/19 0818  WBC 8.1  HGB 7.7*  HCT 26.5*  PLT 276    Chemistries  Recent Labs  Lab 05/04/19 1132 05/04/19 1516 05/09/19 0542  NA 137   < > 137  K 4.8   < > 5.4*  CL 109   < > 108  CO2 20*   < > 22  GLUCOSE 167*   < > 162*  BUN 22   < > 39*  CREATININE 1.66*   < > 2.03*  CALCIUM 8.6*   < > 8.2*  AST 16  --   --   ALT 9  --   --   ALKPHOS 124  --   --   BILITOT 1.0  --   --    < > = values in this interval not displayed.   Cardiac Enzymes No results for input(s): TROPONINI in the last 168 hours. RADIOLOGY:  No results found. ASSESSMENT AND PLAN:  [name] is a 64 y.o.femalewith medical history ofchronic systolic heart failure(last echo however shows an EF of 50 to 55%) with severe tricuspid regurgitation, GI bleed in the past due to AVMs  status post APC with anemia,diabetes mellitus, cervical cancer, hypertension, CKD 3, HCV antibody positive and amputation of left great toe.  Dyspnea on exertion-right-sided heart failure with swollen abdomen and lower extremities -This is secondary to the anemia but also to significant fluid overload and weight gain-her weight was - weights> 199 pounds on 02/26/2019 and is 244 pounds in ED -Mild central vascular congestion on chest x-ray but no overt pulmonary edema - venous duplex ordered by night physician neg for DVT -2D echo 10/2018-EF 50 to 55%, mild LVH, moderate mitral  regurgitation,Moderate - severe tricuspid regurgitation by prior echocardiogram -Started on IV Lasix 60 mg Q6 --will resume torsemide 20 mg from today - repeat echo- Ef 50-55%, Grade 2 d cHF, mod MR and mod TR - pedal edema has resolved as of 4/19- d/c Lasix on 4/19 as Cr has risen significantly--creat now 2.08--trending down. - weight trending down -clinically stable. Off oxygen sats >92% on R  Symptomatic anemia. microcytosis - hb 6.4 and the 5.7 in ED - H/o Iron deficiency anemia due to chronic blood loss - hemoccult neg in ED - 12/21/2018> EGD normal -01/06/2019- capsule endoscopy showed Two bleeding colonic angioectasias.  - transfused 2 U PRBC-- Hb now 7.7 - recommend> follow Hb every month for now -Vit B12 604  CKD stage IIIb - slight Cr bump from 1.66 to 2.26 with Lasix- stopped Lasix 4/19- cr slightly higher today- follow- she appears to be eating and drinking well -creat down to 2.08  Hyperkalemia -lokelma and lactulose today -K 5.4 today no elevated peaked waves -check BMP tomorrow  HCV antibody positive-cirrhosis -HCV antibody reactive on 10/27/2018 -Abnormal ultrasound from 10/27/2018 revealed liver texture that was consistent with cirrhosis  -she stated that she was gaining fluid in her stomach but ultrasound to look for ascites on 4/17 did not show any ascites  Acquired coagulopathy due to vit K deficiency -PT is elevated at 16.6, INR is 1.4- ?Due to cirrhosis-we will start vitamin K- following INR -PTT is normal - 4/19- INR 1.2 now  Obesity Body mass index is 36.07 kg/m.  Hyperlipidemia -statins  Postnasal drip/seasonal allergies - cont Flonase  HTN - cont Coreg   Venous ulcers on legs, pressure ulcer - f/u wound care eval Pressure Injury 05/05/19 Thigh Distal;Left;Posterior Unstageable - Full thickness tissue loss in which the base of the injury is covered by slough (yellow, tan, gray, green or brown) and/or eschar (tan, brown or black) in  the wound bed. 4"x4" area with m (Active)  05/05/19 0700  Location: Thigh  Location Orientation: Distal;Left;Posterior  Staging: Unstageable - Full thickness tissue loss in which the base of the injury is covered by slough (yellow, tan, gray, green or brown) and/or eschar (tan, brown or black) in the wound bed.  Wound Description (Comments): 4"x4" area with mixture of pink granulated wound bed and yellow slough.  Present on Admission: Yes    DVT prophylaxis: Lovenox Code Status: Full code  Consultants: none Status is: Inpatient Dispo: The patient is from: home     Anticipated d/c is to: home since pt is refusing rehab.TOC aware. HH to be     arranged. She is at high risk of readmission. TOC to inform APS     Anticipated d/c date is: 4/23                       Patient currently medically best at baseline   [place] THIS PATIENT: *30* minutes.  >  50% time spent on counselling and coordination of care  Note: This dictation was prepared with Dragon dictation along with smaller phrase technology. Any transcriptional errors that result from this process are unintentional.  Fritzi Mandes M.D    [facility]   CC: Primary care physician; [facility], IncPatient ID: [name], female   DOB: 01-25-1955, 64 y.o.   MRN: [id number]

## 2019-05-10 LAB — GLUCOSE, CAPILLARY
Glucose-Capillary: 157 mg/dL — ABNORMAL HIGH (ref 70–99)
Glucose-Capillary: 180 mg/dL — ABNORMAL HIGH (ref 70–99)
Glucose-Capillary: 138 mg/dL — ABNORMAL HIGH (ref 70–99)

## 2019-05-10 MED ORDER — FERROUS SULFATE 325 (65 FE) MG PO TABS: 325.0000 mg | ORAL_TABLET | Freq: Two times a day (BID) | ORAL | 3 refills | Status: DC

## 2019-05-10 MED ORDER — SODIUM ZIRCONIUM CYCLOSILICATE 10 G PO PACK
10.0000 g | PACK | Freq: Once | ORAL | Status: AC
  Administered 2019-05-10: 10:00:00 10 g via ORAL
  Filled 2019-05-10: qty 1

## 2019-05-10 MED ORDER — FERROUS SULFATE 325 (65 FE) MG PO TABS: 325.0000 mg | ORAL_TABLET | Freq: Two times a day (BID) | ORAL | Status: DC

## 2019-05-10 MED ORDER — TORSEMIDE 20 MG PO TABS: 20.0000 mg | ORAL_TABLET | Freq: Every day | ORAL | 1 refills | Status: DC

## 2019-05-10 MED ORDER — COLLAGENASE 250 UNIT/GM EX OINT: TOPICAL_OINTMENT | Freq: Every day | CUTANEOUS | 0 refills | Status: DC

## 2019-05-10 NOTE — Progress Notes (Signed)
Dressing changed to left prox thigh as ordered.  Site cleansed with NS.  Santyl ointment applied.  NS moistened gauze applied with abd pad.

## 2019-05-10 NOTE — Progress Notes (Signed)
1720 Written and verbal discharge instructions discussed with pt. Discussed medication, follow-up appt. HH, and when to call MD or return to ER. Pt verbalized understanding. IV and tele dc'd. No belongings. Pt went home in hospital gown. Pt picked via EMS for transport home.

## 2019-05-10 NOTE — Progress Notes (Signed)
[facility]) Hospital Liaison RN note  This patient has been referred for outpatient palliative services by [facility] upon discharge.  Please call with any questions or concerns.  Thank you. Margaretmary Eddy, BSN, RN [facility] Liaison  [phone]

## 2019-05-10 NOTE — Discharge Summary (Addendum)
[place] at [place] NAME: [name]    MR#:  [id number]  DATE OF BIRTH:  [birth year]  DATE OF ADMISSION:  05/04/2019 ADMITTING PHYSICIAN: Debbe Odea, MD  DATE OF DISCHARGE: 05/10/2019  PRIMARY CARE PHYSICIAN: Cecile Sheerer, MD    ADMISSION DIAGNOSIS:  Leg pain [M79.606] SOB (shortness of breath) [R06.02] Ascites [R18.8] Symptomatic anemia [D64.9] Acute on chronic congestive heart failure, unspecified heart failure type ([place]) [I50.9]  DISCHARGE DIAGNOSIS:  acute on chronic diastolic congestive heart failure acute on chronic anemia status post blood transfusion. Patient had G.I. workup in dec 2020 CKD stage IIIB history of hep C/cirrhosis (changes noted on USG) Obesity chronic venous ulcers in the leg-- present on admission  SECONDARY DIAGNOSIS:   Past Medical History:  Diagnosis Date  . Cervical cancer ([place])   . CHF (congestive heart failure) ([place])   . Chronic anemia   . COPD (chronic obstructive pulmonary disease) ([place])   . Diabetes mellitus without complication ([place])   . Gastric ulcer   . NSTEMI (non-ST elevated myocardial infarction) ([place])   . Upper GI bleed     HOSPITAL COURSE:  [name] a 64 y.o.femalewith medical history ofchronic systolic heart failure(last echo however shows an EF of 50 to 55%) with severe tricuspid regurgitation, GI bleed in the past due to AVMs status post APC with anemia,diabetes mellitus, cervical cancer, hypertension, CKD 3, HCV antibody positive and amputation of left great toe.  Dyspnea on exertion-right-sided heart failure with swollen abdomen and lower extremities -This is secondary to the anemia but also to significant fluid overload and weight gain -Mild central vascular congestion on chest x-ray but no overt pulmonary edema - venous duplex ordered by night physician neg for DVT -pt was  on IV Lasix 60 mg Q6 --will resume torsemide 20 mg  -repeat echo-  Ef 50-55%, Grade 2 d cHF, mod MR and mod TR -pedal edema has resolved as of 4/19- d/c Lasix on 4/19 as Cr has risen significantly--creat now 2.08--trending down. - weight trending down -clinically stable. Off oxygen sats >92% on RA  Symptomatic anemia. microcytosis - hb 6.4 and the 5.7 in ED -NO active bleed while inhouse - H/o Iron deficiency anemia due to chronic blood loss - hemoccult neg in ED - 12/21/2018> EGD normal -01/06/2019- capsule endoscopy showed Two bleeding colonic angioectasias.  - transfused 2 U PRBC-- Hb now 7.7 - recommend> follow Hb every month for now -Vit B12 604 -po ferrous sulfate bid  CKD stage IIIb -creat down to 2.08  Hyperkalemia -lokelma and lactulose today -K 5.4 today no elevated peaked T waves--K 5.0. will give one more dose today  HCV antibody positive-cirrhosis -HCV antibody reactive on 10/27/2018 -Abnormal ultrasound from 10/27/2018 revealed liver texture that was consistent with cirrhosis -she stated that she was gaining fluid in her stomach but ultrasound to look for ascites on 4/17 did not show any ascites   Obesity Body mass index is 36.07 kg/m.  Hyperlipidemia -statins  Postnasal drip/seasonal allergies - cont Flonase  HTN - cont Coreg   Venous ulcers on legs, pressure ulcer - f/u wound care eval Pressure Injury 05/05/19 Thigh Distal;Left;Posterior Unstageable - Full thickness tissue loss in which the base of the injury is covered by slough (yellow, tan, gray, green or brown) and/or eschar (tan, brown or black) in the wound bed. 4"x4" area with m (Active)  05/05/19 0700  Location: Thigh  Location Orientation: Distal;Left;Posterior  Staging: Unstageable - Full thickness  tissue loss in which the base of the injury is covered by slough (yellow, tan, gray, green or brown) and/or eschar (tan, brown or black) in the wound bed.  Wound Description (Comments): 4"x4" area with mixture of pink granulated wound bed and yellow  slough.  Present on Admission: Yes   DVT prophylaxis:Lovenox Code Status:Full code Consultants:none Status is: Inpatient Dispo: The patient is from: home Anticipated d/c is to: home since pt is refusing rehab.TOC aware. HH to be   arranged. She is at high risk of readmission. TOC to inform APS/DSS     Anticipated d/c date is: 4/23            Patient currently medically best at baseline  Addendum: outpatient palliative care to follow patient  CONSULTS OBTAINED:    DRUG ALLERGIES:   Allergies  Allergen Reactions  . Latex Anaphylaxis and Rash  . Gabapentin Nausea And Vomiting and Other (See Comments)    DISCHARGE MEDICATIONS:   Allergies as of 05/10/2019      Reactions   Latex Anaphylaxis, Rash   Gabapentin Nausea And Vomiting, Other (See Comments)      Medication List    STOP taking these medications   pregabalin 150 MG capsule Commonly known as: LYRICA     TAKE these medications   ammonium lactate 12 % cream Commonly known as: AMLACTIN Apply 1 g topically 2 (two) times daily.   atorvastatin 40 MG tablet Commonly known as: LIPITOR Take 40 mg by mouth daily.   carvedilol 3.125 MG tablet Commonly known as: COREG Take 1 tablet (3.125 mg total) by mouth 2 (two) times daily with a meal.   collagenase ointment Commonly known as: SANTYL Apply topically daily.   feeding supplement (PRO-STAT SUGAR FREE 64) Liqd Take 30 mLs by mouth 2 (two) times daily.   ferrous sulfate 325 (65 FE) MG tablet Take 1 tablet (325 mg total) by mouth 2 (two) times daily with a meal.   fluticasone 50 MCG/ACT nasal spray Commonly known as: FLONASE Place 2 sprays into both nostrils daily.   folic acid 1 MG tablet Commonly known as: FOLVITE Take 1 tablet (1 mg total) by mouth daily.   lactulose 10 GM/15ML solution Commonly known as: CHRONULAC Take 30 mLs (20 g total) by mouth 3 (three) times daily.   mirtazapine 30 MG tablet Commonly known as: REMERON Take  30 mg by mouth at bedtime.   multivitamin with minerals Tabs tablet Take 1 tablet by mouth daily.   RESOURCE 2.0 PO Take 1 Bottle by mouth 2 (two) times daily.   senna-docusate 8.6-50 MG tablet Commonly known as: Senokot-S Take 1 tablet by mouth at bedtime as needed for mild constipation.   spironolactone 25 MG tablet Commonly known as: ALDACTONE Take 12.5 mg by mouth daily.   torsemide 20 MG tablet Commonly known as: DEMADEX Take 1 tablet (20 mg total) by mouth daily. What changed:   how much to take  when to take this  additional instructions       If you experience worsening of your admission symptoms, develop shortness of breath, life threatening emergency, suicidal or homicidal thoughts you must seek medical attention immediately by calling 911 or calling your MD immediately  if symptoms less severe.  You Must read complete instructions/literature along with all the possible adverse reactions/side effects for all the Medicines you take and that have been prescribed to you. Take any new Medicines after you have completely understood and accept all the possible adverse reactions/side  effects.   Please note  You were cared for by a hospitalist during your hospital stay. If you have any questions about your discharge medications or the care you received while you were in the hospital after you are discharged, you can call the unit and asked to speak with the hospitalist on call if the hospitalist that took care of you is not available. Once you are discharged, your primary care physician will handle any further medical issues. Please note that NO REFILLS for any discharge medications will be authorized once you are discharged, as it is imperative that you return to your primary care physician (or establish a relationship with a primary care physician if you do not have one) for your aftercare needs so that they can reassess your need for medications and monitor your lab  values. Today   SUBJECTIVE   No new complaints  No new issues per RN  VITAL SIGNS:  Blood pressure 140/70, pulse 79, temperature 98.6 F (37 C), temperature source Oral, resp. rate 16, height 5' 5"  (1.651 m), weight 110.6 kg, SpO2 96 %.  I/O:    Intake/Output Summary (Last 24 hours) at 05/10/2019 0954 Last data filed at 05/10/2019 0348 Gross per 24 hour  Intake 836 ml  Output 1300 ml  Net -464 ml    PHYSICAL EXAMINATION:  GENERAL:  64 y.o.-year-old patient lying in the bed with no acute distress. Obese EYES: Pupils equal, round, reactive to light and accommodation. No scleral icterus.  HEENT: Head atraumatic, normocephalic. Oropharynx and nasopharynx clear.  NECK:  Supple, no jugular venous distention. No thyroid enlargement, no tenderness.  LUNGS: Normal breath sounds bilaterally, no wheezing, rales,rhonchi or crepitation. No use of accessory muscles of respiration.  CARDIOVASCULAR: S1, S2 normal. No murmurs, rubs, or gallops.  ABDOMEN: Soft, non-tender, non-distended. Bowel sounds present. No organomegaly or mass.  EXTREMITIES: chronic pedal edema--venous stasis changes no cyanosis, or clubbing.  NEUROLOGIC: Cranial nerves II through XII are intact. Muscle strength 4+/5 in all extremities. Sensation intact. Gait not checked.  PSYCHIATRIC: The patient is alert and oriented x 3.  SKIN: No obvious rash, lesion, or ulcer.  Pressure Injury 05/05/19 Thigh Distal;Left;Posterior Unstageable - Full thickness tissue loss in which the base of the injury is covered by slough (yellow, tan, gray, green or brown) and/or eschar (tan, brown or black) in the wound bed. 4"x4" area with m (Active)  05/05/19 0700  Location: Thigh  Location Orientation: Distal;Left;Posterior  Staging: Unstageable - Full thickness tissue loss in which the base of the injury is covered by slough (yellow, tan, gray, green or brown) and/or eschar (tan, brown or black) in the wound bed.  Wound Description (Comments):  4"x4" area with mixture of pink granulated wound bed and yellow slough.  Present on Admission: Yes   DATA REVIEW:   CBC  Recent Labs  Lab 05/06/19 0818  WBC 8.1  HGB 7.7*  HCT 26.5*  PLT 276    Chemistries  Recent Labs  Lab 05/04/19 1132 05/04/19 1516 05/09/19 0542 05/09/19 0542 05/09/19 1419  NA 137   < > 137  --   --   K 4.8   < > 5.4*   < > 5.0  CL 109   < > 108  --   --   CO2 20*   < > 22  --   --   GLUCOSE 167*   < > 162*  --   --   BUN 22   < > 39*  --   --  CREATININE 1.66*   < > 2.03*  --   --   CALCIUM 8.6*   < > 8.2*  --   --   AST 16  --   --   --   --   ALT 9  --   --   --   --   ALKPHOS 124  --   --   --   --   BILITOT 1.0  --   --   --   --    < > = values in this interval not displayed.    Microbiology Results   Recent Results (from the past 240 hour(s))  Respiratory Panel by RT PCR (Flu A&B, Covid) - Nasopharyngeal Swab     Status: None   Collection Time: 05/04/19  1:29 PM   Specimen: Nasopharyngeal Swab  Result Value Ref Range Status   SARS Coronavirus 2 by RT PCR NEGATIVE NEGATIVE Final    Comment: (NOTE) SARS-CoV-2 target nucleic acids are NOT DETECTED. The SARS-CoV-2 RNA is generally detectable in upper respiratoy specimens during the acute phase of infection. The lowest concentration of SARS-CoV-2 viral copies this assay can detect is 131 copies/mL. A negative result does not preclude SARS-Cov-2 infection and should not be used as the sole basis for treatment or other patient management decisions. A negative result may occur with  improper specimen collection/handling, submission of specimen other than nasopharyngeal swab, presence of viral mutation(s) within the areas targeted by this assay, and inadequate number of viral copies (<131 copies/mL). A negative result must be combined with clinical observations, patient history, and epidemiological information. The expected result is Negative. Fact Sheet for Patients:   PinkCheek.be Fact Sheet for Healthcare Providers:  GravelBags.it This test is not yet ap proved or cleared by the [place] FDA and  has been authorized for detection and/or diagnosis of SARS-CoV-2 by FDA under an Emergency Use Authorization (EUA). This EUA will remain  in effect (meaning this test can be used) for the duration of the COVID-19 declaration under Section 564(b)(1) of the Act, 21 U.S.C. section 360bbb-3(b)(1), unless the authorization is terminated or revoked sooner.    Influenza A by PCR NEGATIVE NEGATIVE Final   Influenza B by PCR NEGATIVE NEGATIVE Final    Comment: (NOTE) The Xpert Xpress SARS-CoV-2/FLU/RSV assay is intended as an aid in  the diagnosis of influenza from Nasopharyngeal swab specimens and  should not be used as a sole basis for treatment. Nasal washings and  aspirates are unacceptable for Xpert Xpress SARS-CoV-2/FLU/RSV  testing. Fact Sheet for Patients: PinkCheek.be Fact Sheet for Healthcare Providers: GravelBags.it This test is not yet approved or cleared by the [place] FDA and  has been authorized for detection and/or diagnosis of SARS-CoV-2 by  FDA under an Emergency Use Authorization (EUA). This EUA will remain  in effect (meaning this test can be used) for the duration of the  Covid-19 declaration under Section 564(b)(1) of the Act, 21  U.S.C. section 360bbb-3(b)(1), unless the authorization is  terminated or revoked. Performed at [facility], [address] [place], [place] [address]   MRSA PCR Screening     Status: Abnormal   Collection Time: 05/06/19 11:21 AM   Specimen: Nasal Mucosa; Nasopharyngeal  Result Value Ref Range Status   MRSA by PCR POSITIVE (A) NEGATIVE Final    Comment:        The GeneXpert MRSA Assay (FDA approved for NASAL specimens only), is one component of a comprehensive MRSA  colonization  surveillance program. It is not intended to diagnose MRSA infection nor to guide or monitor treatment for MRSA infections. RESULT CALLED TO, READ BACK BY AND VERIFIED WITH: ERICA MORALES AT 3614 05/06/19 SDR Performed at [facility], [address] [place], [place] [address]     RADIOLOGY:  No results found.   CODE STATUS:     Code Status Orders  (From admission, onward)         Start     Ordered   05/04/19 1532  Full code  Continuous     05/04/19 1534        Code Status History    Date Active Date Inactive Code Status Order ID Comments User Context   02/23/2019 2229 02/27/2019 1835 Full Code [id number]  Lenore Cordia, MD ED   12/16/2018 1704 01/02/2019 1845 Full Code [id number]  Eugenie Filler, MD ED   11/20/2018 1812 12/08/2018 0528 Full Code [id number]  Modena Nunnery D, DO ED   10/26/2018 0048 11/08/2018 0023 Full Code [id number]  Lang Snow, NP ED   08/17/2018 1820 08/22/2018 1805 Full Code [id number]  Dustin Flock, MD Inpatient   06/20/2018 2312 06/27/2018 2119 Full Code [id number]  Mayer Camel, NP ED   01/13/2018 1540 01/16/2018 1859 Full Code [id number]  Loletha Grayer, MD ED   11/03/2017 1758 11/06/2017 2136 Full Code [id number]  Salary, Avel Peace, MD Inpatient   09/26/2017 2353 09/30/2017 1404 Full Code [id number]  Lance Coon, MD Inpatient   08/27/2017 0347 08/29/2017 2054 Full Code [id number]  Harrie Foreman, MD Inpatient   08/07/2017 1704 08/09/2017 2237 Full Code [id number]  Saundra Shelling, MD Inpatient   12/18/2016 1146 12/26/2016 2032 Full Code [id number]  Vaughan Basta, MD Inpatient   12/18/2016 0959 12/18/2016 1146 Full Code [id number]  Vaughan Basta, MD ED   12/04/2015 0509 12/07/2015 1756 Full Code [id number]  Holley Raring, NP ED   10/28/2015 1640 10/29/2015 1711 Full Code [id number]  Bettey Costa, MD Inpatient   08/29/2015 1438 08/30/2015 1750 Full Code [id number]  Hugelmeyer, Ubaldo Glassing, DO Inpatient    Advance Care Planning Activity       TOTAL TIME TAKING CARE OF THIS PATIENT: **40* minutes.    Fritzi Mandes M.D  [facility]    CC: Primary care physician; Shapely-Quinn, Okey Regal, MD

## 2019-05-10 NOTE — TOC Transition Note (Addendum)
Transition of Care [facility]) - CM/SW Discharge Note   Patient Details  Name: [name] MRN: [id number] Date of Birth: 1955-09-25  Transition [facility]) CM/SW Contact:  Eileen Stanford, LCSW Phone Number: 05/10/2019, 2:38 PM   Clinical Narrative:   Pt hh has been arranged through [facility] with a start date of 4/27--MD aware and agreeable. CSW has attempted to reach DSS worker several times and left a voicemail with no return call. Pt's spouse told the RN he would get a ride for himself and the pt.    Final next level of care: Home w Home Health Services Barriers to Discharge: No Barriers Identified   Patient Goals and CMS Choice Patient states their goals for this hospitalization and ongoing recovery are:: to return home with husband CMS Medicare.gov Compare Post Acute Care list provided to:: Patient Choice offered to / list presented to : Patient  Discharge Placement                    Patient and family notified of of transfer: 05/10/19  Discharge Plan and Services   Discharge Planning Services: CM Consult Post Acute Care Choice: Home Health                               Social Determinants of Health (SDOH) Interventions     Readmission Risk Interventions Readmission Risk Prevention Plan 02/26/2019 12/17/2018 10/26/2018  Transportation Screening Complete Complete Complete  PCP or Specialist Appt within 5-7 Days - - -  Home Care Screening - - -  Medication Review (RN CM) - - -  HRI or [place] - - Complete  Social Work Consult for [place] Planning/Counseling - - Not Complete  SW consult not completed comments - - not recovery care  Palliative Care Screening - - Not Applicable  Medication Review (RN Care Manager) Complete Referral to Pharmacy Referral to Pharmacy  PCP or Specialist appointment within 3-5 days of discharge (No Data) Not Complete -  PCP/Specialist Appt Not Complete comments - DC date unknown but pt is established with  providers -  [place] or Home Care Consult Complete Not Complete Complete  HRI or Home Care Consult Pt Refusal Comments - SNF resident -  SW Recovery Care/Counseling Consult - Complete -  Palliative Care Screening Not Applicable Not Complete Not Applicable  Comments - pending need -  [place] Patient Refused Complete -  Some recent data might be hidden

## 2019-05-13 ENCOUNTER — Telehealth: Payer: Self-pay | Admitting: Family

## 2019-05-13 NOTE — Telephone Encounter (Signed)
Unable to reach patient regarding her follow up appointment with the [facility] after her recent hospital discharge on 4/23.    Alyse Low, [place]

## 2019-05-14 ENCOUNTER — Telehealth: Payer: Self-pay | Admitting: Adult Health Nurse Practitioner

## 2019-05-14 NOTE — Progress Notes (Deleted)
Patient ID: [name], female    DOB: [birth year], 64 y.o.   MRN: [id number]  HPI  [name] is a 64 y/o female with a history of COPD, diabetes, NSTEMI, renal insufficiency, tobacco use and chronic heart failure.   Echo report from 05/06/19 reviewed and showed an EF of 50-55% along with moderate LVH, moderate MR and moderately elevated PA pressure. Echo report from 12/19/16 reviewed and showed an EF of 40-45% along with moderate AS, mild Tanya and moderate MR. EF has declined from previous reading in October 2017.   Cardiac catheterization done 12/23/16 showed 3 vessel CAD with considerate of CABG when pulmonary and renal status is optimized. Prox RCA to mid RCA has 100% stenosis, Prox CX to mid CX has 80% stenosis and Ost LAD to prox LAD has 90% stenosis.   Admitted 05/04/19 due to acute on chronic heart failure. Wound care consult obtained. Initially given IV lasix and then transitioned to oral diuretics. Negative for DVT. Given 2 units of PRBC's due to anemia. Has cirrhosis but no ascites. Discharged after 6 days.   She presents today for a follow-up visit although hasn't been seen since 2018.    Past Medical History:  Diagnosis Date  . Cervical cancer ([place])   . CHF (congestive heart failure) ([place])   . Chronic anemia   . COPD (chronic obstructive pulmonary disease) ([place])   . Diabetes mellitus without complication ([place])   . Gastric ulcer   . NSTEMI (non-ST elevated myocardial infarction) ([place])   . Upper GI bleed    Past Surgical History:  Procedure Laterality Date  . AMPUTATION Left 06/24/2018   Procedure: AMPUTATION GREAT TOE;  Surgeon: Samara Deist, DPM;  Location: [facility] ORS;  Service: Podiatry;  Laterality: Left;  . APPENDECTOMY    . BIOPSY  11/22/2018   Procedure: BIOPSY;  Surgeon: Thornton Park, MD;  Location: [place];  Service: Gastroenterology;;  . CARDIAC CATHETERIZATION    . CHOLECYSTECTOMY    . COLONOSCOPY N/A 09/28/2017   Procedure: COLONOSCOPY;  Surgeon:  Toledo, Benay Pike, MD;  Location: [facility] ENDOSCOPY;  Service: Gastroenterology;  Laterality: N/A;  . COLONOSCOPY WITH PROPOFOL N/A 08/21/2018   Procedure: COLONOSCOPY WITH PROPOFOL;  Surgeon: Lin Landsman, MD;  Location: [facility] ENDOSCOPY;  Service: Gastroenterology;  Laterality: N/A;  . COLONOSCOPY WITH PROPOFOL N/A 12/28/2018   Procedure: COLONOSCOPY WITH PROPOFOL;  Surgeon: Rush Landmark Telford Nab., MD;  Location: [facility] ENDOSCOPY;  Service: Gastroenterology;  Laterality: N/A;  . ENTEROSCOPY Left 11/06/2017   Procedure: ENTEROSCOPY;  Surgeon: Jonathon Bellows, MD;  Location: [facility] ENDOSCOPY;  Service: Gastroenterology;  Laterality: Left;  . ENTEROSCOPY N/A 06/22/2018   Procedure: ENTEROSCOPY;  Surgeon: Lin Landsman, MD;  Location: [facility] ENDOSCOPY;  Service: Gastroenterology;  Laterality: N/A;  . ENTEROSCOPY N/A 08/18/2018   Procedure: ENTEROSCOPY;  Surgeon: Jonathon Bellows, MD;  Location: [facility] ENDOSCOPY;  Service: Gastroenterology;  Laterality: N/A;  . ENTEROSCOPY N/A 11/01/2018   Procedure: ENTEROSCOPY;  Surgeon: Virgel Manifold, MD;  Location: [facility] ENDOSCOPY;  Service: Endoscopy;  Laterality: N/A;  . ENTEROSCOPY N/A 11/22/2018   Procedure: ENTEROSCOPY;  Surgeon: Thornton Park, MD;  Location: [place];  Service: Gastroenterology;  Laterality: N/A;  . ENTEROSCOPY Left 12/17/2018   Procedure: ENTEROSCOPY;  Surgeon: Yetta Flock, MD;  Location: [facility] ENDOSCOPY;  Service: Gastroenterology;  Laterality: Left;  . ENTEROSCOPY N/A 12/19/2018   Procedure: ENTEROSCOPY;  Surgeon: Yetta Flock, MD;  Location: [facility] ENDOSCOPY;  Service: Gastroenterology;  Laterality: N/A;  . ENTEROSCOPY N/A 12/20/2018  Procedure: ENTEROSCOPY;  Surgeon: Lavena Bullion, DO;  Location: [facility] ENDOSCOPY;  Service: Gastroenterology;  Laterality: N/A;  . ESOPHAGOGASTRODUODENOSCOPY N/A 12/19/2016   Procedure: ESOPHAGOGASTRODUODENOSCOPY (EGD);  Surgeon: Lin Landsman, MD;  Location[facility]COPY;  Service:  Gastroenterology;  Laterality: N/A;  . ESOPHAGOGASTRODUODENOSCOPY N/A 09/28/2017   Procedure: ESOPHAGOGASTRODUODENOSCOPY (EGD);  Surgeon: Toledo, Benay Pike, MD;  Location: [facility] ENDOSCOPY;  Service: Gastroenterology;  Laterality: N/A;  . ESOPHAGOGASTRODUODENOSCOPY (EGD) WITH PROPOFOL N/A 08/08/2017   Procedure: ESOPHAGOGASTRODUODENOSCOPY (EGD) WITH PROPOFOL;  Surgeon: [name], MD;  Location: [facility] ENDOSCOPY;  Service: Endoscopy;  Laterality: N/A;  . GIVENS CAPSULE STUDY N/A 10/28/2018   Procedure: GIVENS CAPSULE STUDY;  Surgeon: Jonathon Bellows, MD;  Location: [facility] ENDOSCOPY;  Service: Gastroenterology;  Laterality: N/A;  . GIVENS CAPSULE STUDY N/A 12/26/2018   Procedure: GIVENS CAPSULE STUDY;  Surgeon: Rush Landmark Telford Nab., MD;  Location: [facility] ENDOSCOPY;  Service: Gastroenterology;  Laterality: N/A;  . [place]    . HEMOSTASIS CLIP PLACEMENT  12/19/2018   Procedure: HEMOSTASIS CLIP PLACEMENT;  Surgeon: Yetta Flock, MD;  Location: [facility];  Service: Gastroenterology;;  . HEMOSTASIS CLIP PLACEMENT  12/28/2018   Procedure: HEMOSTASIS CLIP PLACEMENT;  Surgeon: Irving Copas., MD;  Location: [facility] ENDOSCOPY;  Service: Gastroenterology;;  . HOT HEMOSTASIS N/A 12/17/2018   Procedure: HOT HEMOSTASIS (ARGON PLASMA COAGULATION/BICAP);  Surgeon: Yetta Flock, MD;  Location: [facility];  Service: Gastroenterology;  Laterality: N/A;  . HOT HEMOSTASIS N/A 12/28/2018   Procedure: HOT HEMOSTASIS (ARGON PLASMA COAGULATION/BICAP);  Surgeon: Irving Copas., MD;  Location: [facility];  Service: Gastroenterology;  Laterality: N/A;  . LEFT HEART CATH AND CORONARY ANGIOGRAPHY N/A 12/23/2016   Procedure: LEFT HEART CATH AND CORONARY ANGIOGRAPHY;  Surgeon: Teodoro Spray, MD;  Location: [place] CV LAB;  Service: Cardiovascular;  Laterality: N/A;  . LOWER EXTREMITY ANGIOGRAPHY Left 06/26/2018   Procedure: Lower Extremity Angiography;  Surgeon: Katha Cabal, MD;  Location: [place] CV LAB;  Service: Cardiovascular;  Laterality: Left;  . SUBMUCOSAL INJECTION  12/19/2018   Procedure: SUBMUCOSAL INJECTION;  Surgeon: Yetta Flock, MD;  Location: [facility];  Service: Gastroenterology;;  . Lia Foyer TATTOO INJECTION  12/17/2018   Procedure: SUBMUCOSAL TATTOO INJECTION;  Surgeon: Yetta Flock, MD;  Location: [facility] ENDOSCOPY;  Service: Gastroenterology;;  . Lia Foyer TATTOO INJECTION  12/28/2018   Procedure: SUBMUCOSAL TATTOO INJECTION;  Surgeon: Irving Copas., MD;  Location: [facility] ENDOSCOPY;  Service: Gastroenterology;;  . TEE WITHOUT CARDIOVERSION N/A 10/29/2018   Procedure: TRANSESOPHAGEAL ECHOCARDIOGRAM (TEE);  Surgeon: Teodoro Spray, MD;  Location: [facility] ORS;  Service: Cardiovascular;  Laterality: N/A;   Family History  Problem Relation Age of Onset  . Hypertension Mother   . Diabetes Mellitus II Mother   . Breast cancer Mother   . Heart disease Father   . Cancer Sister    Social History   Tobacco Use  . Smoking status: Current Some Day Smoker    Packs/day: 0.25    Types: Cigarettes  . Smokeless tobacco: Never Used  Substance Use Topics  . Alcohol use: No   Allergies  Allergen Reactions  . Latex Anaphylaxis and Rash  . Gabapentin Nausea And Vomiting and Other (See Comments)     Review of Systems  Constitutional: Positive for appetite change (decreased) and fatigue.  HENT: Negative for congestion, postnasal drip and sore throat.   Eyes: Negative.   Respiratory: Negative for chest tightness and shortness of breath.   Cardiovascular: Negative  for chest pain, palpitations and leg swelling.  Gastrointestinal: Negative for abdominal distention and abdominal pain.  Endocrine: Negative.   Genitourinary: Negative.   Musculoskeletal: Positive for back pain (chronic back pain). Negative for neck pain.  Skin: Negative.   Allergic/Immunologic: Negative.   Neurological: Positive for weakness and  light-headedness (yesterday). Negative for dizziness.  Hematological: Negative for adenopathy. Does not bruise/bleed easily.  Psychiatric/Behavioral: Positive for sleep disturbance (wake often during the night). Negative for dysphoric mood. The patient is nervous/anxious.      Physical Exam  Constitutional: She is oriented to person, place, and time. She appears well-developed and well-nourished.  HENT:  Head: Normocephalic and atraumatic.  Neck: No JVD present.  Cardiovascular: Normal rate and regular rhythm.  Pulmonary/Chest: Effort normal. She has no wheezes. She has no rales.  Abdominal: Soft. She exhibits no distension. There is no abdominal tenderness.  Musculoskeletal:        General: No tenderness or edema.     Cervical back: Normal range of motion and neck supple.  Neurological: She is alert and oriented to person, place, and time.  Skin: Skin is warm and dry.  Psychiatric: She has a normal mood and affect. Her behavior is normal. Thought content normal.  Nursing note and vitals reviewed.  Assessment & Plan:  1: Chronic heart failure with preserved ejection fraction with structural changes- - NYHA class III - euvolemic today - not weighing daily as she doesn't have any scales. Set of scales was given to her and she was instructed to weigh every morning, write the weight down and call for an overnight weight gain of >2 pounds or a weekly weight gain of >5 pounds - adds "some" salt to foods and admits that they eat fast food and convenience type foods more often now. Discussed the importance of not adding any salt to her food and to read food labels so that she can closely follow a 2042m sodium diet. Written dietary information was given to her about this.  - unsure of her fluid intake. Discussed keeping her daily fluid intake to between 40-60 ounces of fluid daily. Instructed that this included anything that was liquid at room temperature such as jello/ice cream - she reports  receiving her flu vaccine for this season already  2: NSTEMI- - does not have cardiology f/u appointment scheduled and has questions regarding possible CABG - called Dr FBethanne Gingeroffice (who did the cath) and [facility]. However, receptionist said she would speak with Dr. FUbaldo Glassing- in the meantime, an appointment was made with Dr. ESaunders Revelwho could see her today. [facility] an [facility] - spoke with Dr. ESaunders Revelwho said that he would call Dr. FUbaldo Glassingto ask what he would prefer to do so that patient can get scheduled for possible CABG  3: Diabetes- - fasting glucose in clinic today was 179 - currently doesn't have a PCP but she says that she will call around today and get something scheduled - BMP from 12/26/16 reviewed and showed sodium 135, potassium 4.5 and GFR 32 - A1c from 08/12/16 reviewed and was >14%  4: Tobacco- - currently smoking 1-2 cigarettes daily - complete cessation discussed for 3 minutes with her.  Patient did not bring her medications nor a list. Each medication was verbally reviewed with the patient  and she was encouraged to bring the bottles to every visit to confirm accuracy of list.  Return in 1 month or sooner for any questions/problems before then.

## 2019-05-14 NOTE — Telephone Encounter (Signed)
Called patient's cell X3, to schedule the Palliative Consult, phone was busy.  I then called listed home number, no answer - left message with reason for call along with my name and call back number.

## 2019-05-15 ENCOUNTER — Ambulatory Visit: Payer: Medicaid Other | Admitting: Family

## 2019-05-15 ENCOUNTER — Telehealth: Payer: Self-pay | Admitting: Family

## 2019-05-15 NOTE — Telephone Encounter (Signed)
Patient did not show for her Heart Failure Clinic appointment on 05/15/19. Will attempt to reschedule.

## 2019-05-23 ENCOUNTER — Telehealth: Payer: Self-pay | Admitting: Adult Health Nurse Practitioner

## 2019-05-23 NOTE — Telephone Encounter (Signed)
Called patient's cell number to schedule the Palliative Consult, and it was busy X3.  I then called home number as well as husband's cell number with no answer - left message at both numbers with reason for call along with my name and contact number.

## 2019-05-26 ENCOUNTER — Inpatient Hospital Stay: Payer: Medicaid Other

## 2019-05-26 ENCOUNTER — Inpatient Hospital Stay
Admission: EM | Admit: 2019-05-26 | Discharge: 2019-06-06 | DRG: 871 | Disposition: A | Discharge status: Skilled Nursing Facility | Payer: Medicaid Other | Attending: Internal Medicine | Admitting: Internal Medicine

## 2019-05-26 ENCOUNTER — Encounter: Payer: Self-pay | Admitting: Emergency Medicine

## 2019-05-26 ENCOUNTER — Emergency Department: Payer: Medicaid Other

## 2019-05-26 ENCOUNTER — Other Ambulatory Visit: Payer: Self-pay

## 2019-05-26 DIAGNOSIS — R400 Somnolence: Secondary | ICD-10-CM | POA: Diagnosis not present

## 2019-05-26 DIAGNOSIS — F17210 Nicotine dependence, cigarettes, uncomplicated: Secondary | ICD-10-CM | POA: Diagnosis present

## 2019-05-26 DIAGNOSIS — Z8249 Family history of ischemic heart disease and other diseases of the circulatory system: Secondary | ICD-10-CM

## 2019-05-26 DIAGNOSIS — M7989 Other specified soft tissue disorders: Secondary | ICD-10-CM | POA: Diagnosis not present

## 2019-05-26 DIAGNOSIS — L89619 Pressure ulcer of right heel, unspecified stage: Secondary | ICD-10-CM

## 2019-05-26 DIAGNOSIS — R4182 Altered mental status, unspecified: Secondary | ICD-10-CM | POA: Diagnosis not present

## 2019-05-26 DIAGNOSIS — N1832 Chronic kidney disease, stage 3b: Secondary | ICD-10-CM | POA: Diagnosis present

## 2019-05-26 DIAGNOSIS — N179 Acute kidney failure, unspecified: Secondary | ICD-10-CM | POA: Diagnosis not present

## 2019-05-26 DIAGNOSIS — I425 Other restrictive cardiomyopathy: Secondary | ICD-10-CM | POA: Diagnosis present

## 2019-05-26 DIAGNOSIS — N289 Disorder of kidney and ureter, unspecified: Secondary | ICD-10-CM | POA: Diagnosis not present

## 2019-05-26 DIAGNOSIS — J449 Chronic obstructive pulmonary disease, unspecified: Secondary | ICD-10-CM | POA: Diagnosis present

## 2019-05-26 DIAGNOSIS — Z951 Presence of aortocoronary bypass graft: Secondary | ICD-10-CM

## 2019-05-26 DIAGNOSIS — E1142 Type 2 diabetes mellitus with diabetic polyneuropathy: Secondary | ICD-10-CM

## 2019-05-26 DIAGNOSIS — E1122 Type 2 diabetes mellitus with diabetic chronic kidney disease: Secondary | ICD-10-CM | POA: Diagnosis present

## 2019-05-26 DIAGNOSIS — J42 Unspecified chronic bronchitis: Secondary | ICD-10-CM | POA: Diagnosis not present

## 2019-05-26 DIAGNOSIS — L89616 Pressure-induced deep tissue damage of right heel: Secondary | ICD-10-CM | POA: Diagnosis not present

## 2019-05-26 DIAGNOSIS — B955 Unspecified streptococcus as the cause of diseases classified elsewhere: Secondary | ICD-10-CM | POA: Diagnosis not present

## 2019-05-26 DIAGNOSIS — D638 Anemia in other chronic diseases classified elsewhere: Secondary | ICD-10-CM | POA: Diagnosis not present

## 2019-05-26 DIAGNOSIS — I5033 Acute on chronic diastolic (congestive) heart failure: Secondary | ICD-10-CM | POA: Diagnosis present

## 2019-05-26 DIAGNOSIS — Z89412 Acquired absence of left great toe: Secondary | ICD-10-CM

## 2019-05-26 DIAGNOSIS — R6520 Severe sepsis without septic shock: Secondary | ICD-10-CM | POA: Diagnosis not present

## 2019-05-26 DIAGNOSIS — L89626 Pressure-induced deep tissue damage of left heel: Secondary | ICD-10-CM | POA: Diagnosis not present

## 2019-05-26 DIAGNOSIS — L89210 Pressure ulcer of right hip, unstageable: Secondary | ICD-10-CM | POA: Diagnosis not present

## 2019-05-26 DIAGNOSIS — R5383 Other fatigue: Secondary | ICD-10-CM | POA: Diagnosis present

## 2019-05-26 DIAGNOSIS — M25471 Effusion, right ankle: Secondary | ICD-10-CM

## 2019-05-26 DIAGNOSIS — M25472 Effusion, left ankle: Secondary | ICD-10-CM | POA: Diagnosis not present

## 2019-05-26 DIAGNOSIS — L03115 Cellulitis of right lower limb: Secondary | ICD-10-CM | POA: Diagnosis present

## 2019-05-26 DIAGNOSIS — E669 Obesity, unspecified: Secondary | ICD-10-CM | POA: Diagnosis present

## 2019-05-26 DIAGNOSIS — L89629 Pressure ulcer of left heel, unspecified stage: Secondary | ICD-10-CM | POA: Diagnosis not present

## 2019-05-26 DIAGNOSIS — Z794 Long term (current) use of insulin: Secondary | ICD-10-CM | POA: Diagnosis not present

## 2019-05-26 DIAGNOSIS — R4189 Other symptoms and signs involving cognitive functions and awareness: Secondary | ICD-10-CM | POA: Diagnosis not present

## 2019-05-26 DIAGNOSIS — K7460 Unspecified cirrhosis of liver: Secondary | ICD-10-CM | POA: Diagnosis present

## 2019-05-26 DIAGNOSIS — Z20822 Contact with and (suspected) exposure to covid-19: Secondary | ICD-10-CM | POA: Diagnosis present

## 2019-05-26 DIAGNOSIS — D631 Anemia in chronic kidney disease: Secondary | ICD-10-CM | POA: Diagnosis present

## 2019-05-26 DIAGNOSIS — E1121 Type 2 diabetes mellitus with diabetic nephropathy: Secondary | ICD-10-CM | POA: Diagnosis not present

## 2019-05-26 DIAGNOSIS — G9341 Metabolic encephalopathy: Secondary | ICD-10-CM | POA: Diagnosis not present

## 2019-05-26 DIAGNOSIS — Z515 Encounter for palliative care: Secondary | ICD-10-CM | POA: Diagnosis present

## 2019-05-26 DIAGNOSIS — I252 Old myocardial infarction: Secondary | ICD-10-CM

## 2019-05-26 DIAGNOSIS — Z7189 Other specified counseling: Secondary | ICD-10-CM | POA: Diagnosis not present

## 2019-05-26 DIAGNOSIS — L89890 Pressure ulcer of other site, unstageable: Secondary | ICD-10-CM | POA: Diagnosis present

## 2019-05-26 DIAGNOSIS — R601 Generalized edema: Secondary | ICD-10-CM | POA: Diagnosis present

## 2019-05-26 DIAGNOSIS — Z993 Dependence on wheelchair: Secondary | ICD-10-CM

## 2019-05-26 DIAGNOSIS — R7881 Bacteremia: Secondary | ICD-10-CM | POA: Diagnosis not present

## 2019-05-26 DIAGNOSIS — G4730 Sleep apnea, unspecified: Secondary | ICD-10-CM | POA: Diagnosis present

## 2019-05-26 DIAGNOSIS — Z803 Family history of malignant neoplasm of breast: Secondary | ICD-10-CM

## 2019-05-26 DIAGNOSIS — L97509 Non-pressure chronic ulcer of other part of unspecified foot with unspecified severity: Secondary | ICD-10-CM | POA: Diagnosis present

## 2019-05-26 DIAGNOSIS — L97519 Non-pressure chronic ulcer of other part of right foot with unspecified severity: Secondary | ICD-10-CM

## 2019-05-26 DIAGNOSIS — A409 Streptococcal sepsis, unspecified: Secondary | ICD-10-CM | POA: Diagnosis not present

## 2019-05-26 DIAGNOSIS — IMO0002 Reserved for concepts with insufficient information to code with codable children: Secondary | ICD-10-CM | POA: Diagnosis present

## 2019-05-26 DIAGNOSIS — I2510 Atherosclerotic heart disease of native coronary artery without angina pectoris: Secondary | ICD-10-CM | POA: Diagnosis present

## 2019-05-26 DIAGNOSIS — A408 Other streptococcal sepsis: Secondary | ICD-10-CM | POA: Diagnosis present

## 2019-05-26 DIAGNOSIS — N189 Chronic kidney disease, unspecified: Secondary | ICD-10-CM | POA: Diagnosis not present

## 2019-05-26 DIAGNOSIS — E1165 Type 2 diabetes mellitus with hyperglycemia: Secondary | ICD-10-CM | POA: Diagnosis not present

## 2019-05-26 DIAGNOSIS — E785 Hyperlipidemia, unspecified: Secondary | ICD-10-CM | POA: Diagnosis not present

## 2019-05-26 DIAGNOSIS — I878 Other specified disorders of veins: Secondary | ICD-10-CM | POA: Diagnosis present

## 2019-05-26 DIAGNOSIS — G4733 Obstructive sleep apnea (adult) (pediatric): Secondary | ICD-10-CM | POA: Diagnosis present

## 2019-05-26 DIAGNOSIS — N183 Chronic kidney disease, stage 3 unspecified: Secondary | ICD-10-CM | POA: Diagnosis not present

## 2019-05-26 DIAGNOSIS — I959 Hypotension, unspecified: Secondary | ICD-10-CM | POA: Diagnosis present

## 2019-05-26 DIAGNOSIS — E872 Acidosis: Secondary | ICD-10-CM | POA: Diagnosis present

## 2019-05-26 DIAGNOSIS — A419 Sepsis, unspecified organism: Secondary | ICD-10-CM | POA: Diagnosis present

## 2019-05-26 DIAGNOSIS — B1920 Unspecified viral hepatitis C without hepatic coma: Secondary | ICD-10-CM | POA: Diagnosis present

## 2019-05-26 DIAGNOSIS — E11621 Type 2 diabetes mellitus with foot ulcer: Secondary | ICD-10-CM | POA: Diagnosis present

## 2019-05-26 DIAGNOSIS — I130 Hypertensive heart and chronic kidney disease with heart failure and stage 1 through stage 4 chronic kidney disease, or unspecified chronic kidney disease: Secondary | ICD-10-CM | POA: Diagnosis present

## 2019-05-26 DIAGNOSIS — Z8711 Personal history of peptic ulcer disease: Secondary | ICD-10-CM

## 2019-05-26 DIAGNOSIS — Z6841 Body Mass Index (BMI) 40.0 and over, adult: Secondary | ICD-10-CM | POA: Diagnosis not present

## 2019-05-26 DIAGNOSIS — Z8541 Personal history of malignant neoplasm of cervix uteri: Secondary | ICD-10-CM

## 2019-05-26 DIAGNOSIS — Z833 Family history of diabetes mellitus: Secondary | ICD-10-CM

## 2019-05-26 DIAGNOSIS — D649 Anemia, unspecified: Secondary | ICD-10-CM | POA: Diagnosis not present

## 2019-05-26 DIAGNOSIS — L0390 Cellulitis, unspecified: Secondary | ICD-10-CM | POA: Diagnosis present

## 2019-05-26 DIAGNOSIS — E1151 Type 2 diabetes mellitus with diabetic peripheral angiopathy without gangrene: Secondary | ICD-10-CM | POA: Diagnosis present

## 2019-05-26 DIAGNOSIS — E8770 Fluid overload, unspecified: Secondary | ICD-10-CM

## 2019-05-26 DIAGNOSIS — Z953 Presence of xenogenic heart valve: Secondary | ICD-10-CM

## 2019-05-26 DIAGNOSIS — L89220 Pressure ulcer of left hip, unstageable: Secondary | ICD-10-CM | POA: Diagnosis not present

## 2019-05-26 DIAGNOSIS — Z79899 Other long term (current) drug therapy: Secondary | ICD-10-CM

## 2019-05-26 LAB — BASIC METABOLIC PANEL
Anion gap: 9 (ref 5–15)
BUN: 28 mg/dL — ABNORMAL HIGH (ref 8–23)
CO2: 18 mmol/L — ABNORMAL LOW (ref 22–32)
Calcium: 8.3 mg/dL — ABNORMAL LOW (ref 8.9–10.3)
Chloride: 103 mmol/L (ref 98–111)
Creatinine, Ser: 1.88 mg/dL — ABNORMAL HIGH (ref 0.44–1.00)
GFR calc Af Amer: 32 mL/min — ABNORMAL LOW (ref >60)
GFR calc non Af Amer: 28 mL/min — ABNORMAL LOW (ref >60)
Glucose, Bld: 314 mg/dL — ABNORMAL HIGH (ref 70–99)
Potassium: 4.7 mmol/L (ref 3.5–5.1)
Sodium: 130 mmol/L — ABNORMAL LOW (ref 135–145)

## 2019-05-26 LAB — CBC WITH DIFFERENTIAL/PLATELET
Abs Immature Granulocytes: 0.02 10*3/uL (ref 0.00–0.07)
Basophils Absolute: 0 10*3/uL (ref 0.0–0.1)
Basophils Relative: 0 %
Eosinophils Absolute: 0 10*3/uL (ref 0.0–0.5)
Eosinophils Relative: 0 %
HCT: 27.1 % — ABNORMAL LOW (ref 36.0–46.0)
Hemoglobin: 7.8 g/dL — ABNORMAL LOW (ref 12.0–15.0)
Immature Granulocytes: 0 %
Lymphocytes Relative: 2 %
Lymphs Abs: 0.2 10*3/uL — ABNORMAL LOW (ref 0.7–4.0)
MCH: 21.8 pg — ABNORMAL LOW (ref 26.0–34.0)
MCHC: 28.8 g/dL — ABNORMAL LOW (ref 30.0–36.0)
MCV: 75.9 fL — ABNORMAL LOW (ref 80.0–100.0)
Monocytes Absolute: 0.2 10*3/uL (ref 0.1–1.0)
Monocytes Relative: 3 %
Neutro Abs: 6.5 10*3/uL (ref 1.7–7.7)
Neutrophils Relative %: 95 %
Platelets: 179 10*3/uL (ref 150–400)
RBC: 3.57 MIL/uL — ABNORMAL LOW (ref 3.87–5.11)
RDW: 22.3 % — ABNORMAL HIGH (ref 11.5–15.5)
WBC: 6.9 10*3/uL (ref 4.0–10.5)
nRBC: 0 % (ref 0.0–0.2)

## 2019-05-26 LAB — BLOOD GAS, ARTERIAL
Acid-base deficit: 7.3 mmol/L — ABNORMAL HIGH (ref 0.0–2.0)
Allens test (pass/fail): POSITIVE — AB
Bicarbonate: 18 mmol/L — ABNORMAL LOW (ref 20.0–28.0)
FIO2: 21
O2 Saturation: 95.2 %
Patient temperature: 37
pCO2 arterial: 35 mmHg (ref 32.0–48.0)
pH, Arterial: 7.32 — ABNORMAL LOW (ref 7.350–7.450)
pO2, Arterial: 83 mmHg (ref 83.0–108.0)

## 2019-05-26 LAB — GLUCOSE, CAPILLARY: Glucose-Capillary: 257 mg/dL — ABNORMAL HIGH (ref 70–99)

## 2019-05-26 LAB — BETA-HYDROXYBUTYRIC ACID: Beta-Hydroxybutyric Acid: <0.05 mmol/L — ABNORMAL LOW (ref 0.05–0.27)

## 2019-05-26 LAB — RESPIRATORY PANEL BY RT PCR (FLU A&B, COVID)
Influenza A by PCR: NEGATIVE
Influenza B by PCR: NEGATIVE
SARS Coronavirus 2 by RT PCR: NEGATIVE

## 2019-05-26 LAB — LACTIC ACID, PLASMA: Lactic Acid, Venous: 5.2 mmol/L (ref 0.5–1.9)

## 2019-05-26 LAB — BRAIN NATRIURETIC PEPTIDE: B Natriuretic Peptide: 827 pg/mL — ABNORMAL HIGH (ref 0.0–100.0)

## 2019-05-26 LAB — CK: Total CK: 162 U/L (ref 38–234)

## 2019-05-26 MED ORDER — ENSURE ENLIVE PO LIQD
Freq: Two times a day (BID) | ORAL | Status: DC
  Administered 2019-05-27 – 2019-05-28 (×3): 237 mL via ORAL

## 2019-05-26 MED ORDER — CARVEDILOL 3.125 MG PO TABS
3.1250 mg | ORAL_TABLET | Freq: Two times a day (BID) | ORAL | Status: DC
  Administered 2019-05-29: 3.125 mg via ORAL
  Filled 2019-05-26 (×2): qty 1

## 2019-05-26 MED ORDER — LACTULOSE 10 GM/15ML PO SOLN
20.0000 g | Freq: Three times a day (TID) | ORAL | Status: DC
  Administered 2019-05-27 – 2019-06-06 (×25): 20 g via ORAL
  Filled 2019-05-26 (×27): qty 30

## 2019-05-26 MED ORDER — VANCOMYCIN HCL 500 MG/100ML IV SOLN
500.0000 mg | Freq: Once | INTRAVENOUS | Status: AC
  Administered 2019-05-26: 500 mg via INTRAVENOUS
  Filled 2019-05-26: qty 100

## 2019-05-26 MED ORDER — NALOXONE HCL 2 MG/2ML IJ SOSY
0.4000 mg | PREFILLED_SYRINGE | Freq: Once | INTRAMUSCULAR | Status: AC
  Administered 2019-05-26: 0.4 mg via INTRAVENOUS

## 2019-05-26 MED ORDER — SPIRONOLACTONE 25 MG PO TABS: 12.5000 mg | ORAL_TABLET | Freq: Every day | ORAL | Status: DC

## 2019-05-26 MED ORDER — NALOXONE HCL 2 MG/2ML IJ SOSY
PREFILLED_SYRINGE | INTRAMUSCULAR | Status: AC
  Filled 2019-05-26: qty 2

## 2019-05-26 MED ORDER — ATORVASTATIN CALCIUM 20 MG PO TABS
40.0000 mg | ORAL_TABLET | Freq: Every day | ORAL | Status: DC
  Administered 2019-05-27 – 2019-06-06 (×10): 40 mg via ORAL
  Filled 2019-05-26 (×5): qty 2
  Filled 2019-05-26: qty 4
  Filled 2019-05-26 (×4): qty 2

## 2019-05-26 MED ORDER — METRONIDAZOLE IN NACL 5-0.79 MG/ML-% IV SOLN
500.0000 mg | Freq: Three times a day (TID) | INTRAVENOUS | Status: DC
  Administered 2019-05-26 – 2019-05-27 (×2): 500 mg via INTRAVENOUS
  Filled 2019-05-26 (×2): qty 100

## 2019-05-26 MED ORDER — AMMONIUM LACTATE 12 % EX LOTN
1.0000 "application " | TOPICAL_LOTION | Freq: Two times a day (BID) | CUTANEOUS | Status: DC
  Administered 2019-05-27 – 2019-06-06 (×17): 1 via TOPICAL
  Filled 2019-05-26 (×2): qty 400

## 2019-05-26 MED ORDER — FERROUS SULFATE 325 (65 FE) MG PO TABS
325.0000 mg | ORAL_TABLET | Freq: Two times a day (BID) | ORAL | Status: DC
  Administered 2019-05-28 – 2019-06-06 (×17): 325 mg via ORAL
  Filled 2019-05-26 (×20): qty 1

## 2019-05-26 MED ORDER — INSULIN ASPART 100 UNIT/ML ~~LOC~~ SOLN
0.0000 [IU] | Freq: Three times a day (TID) | SUBCUTANEOUS | Status: DC
  Administered 2019-05-27 (×2): 3 [IU] via SUBCUTANEOUS
  Administered 2019-05-27 – 2019-05-28 (×2): 2 [IU] via SUBCUTANEOUS
  Administered 2019-05-28: 3 [IU] via SUBCUTANEOUS
  Administered 2019-05-28 – 2019-05-30 (×4): 2 [IU] via SUBCUTANEOUS
  Administered 2019-05-30 (×2): 1 [IU] via SUBCUTANEOUS
  Administered 2019-05-31 (×3): 2 [IU] via SUBCUTANEOUS
  Administered 2019-06-01 (×2): 1 [IU] via SUBCUTANEOUS
  Administered 2019-06-01 – 2019-06-02 (×2): 2 [IU] via SUBCUTANEOUS
  Administered 2019-06-02: 1 [IU] via SUBCUTANEOUS
  Administered 2019-06-02: 2 [IU] via SUBCUTANEOUS
  Administered 2019-06-03 (×2): 1 [IU] via SUBCUTANEOUS
  Administered 2019-06-03: 2 [IU] via SUBCUTANEOUS
  Administered 2019-06-04: 1 [IU] via SUBCUTANEOUS
  Administered 2019-06-04: 2 [IU] via SUBCUTANEOUS
  Administered 2019-06-04: 1 [IU] via SUBCUTANEOUS
  Administered 2019-06-05 – 2019-06-06 (×3): 2 [IU] via SUBCUTANEOUS
  Filled 2019-05-26 (×29): qty 1

## 2019-05-26 MED ORDER — FLUTICASONE PROPIONATE 50 MCG/ACT NA SUSP
2.0000 | Freq: Every day | NASAL | Status: DC
  Administered 2019-05-29 – 2019-06-05 (×7): 2 via NASAL
  Filled 2019-05-26: qty 16

## 2019-05-26 MED ORDER — ADULT MULTIVITAMIN W/MINERALS CH
1.0000 | ORAL_TABLET | Freq: Every day | ORAL | Status: DC
  Administered 2019-05-27 – 2019-06-06 (×10): 1 via ORAL
  Filled 2019-05-26 (×10): qty 1

## 2019-05-26 MED ORDER — ALBUMIN HUMAN 25 % IV SOLN
25.0000 g | Freq: Once | INTRAVENOUS | Status: DC
  Filled 2019-05-26: qty 100

## 2019-05-26 MED ORDER — FOLIC ACID 1 MG PO TABS
1.0000 mg | ORAL_TABLET | Freq: Every day | ORAL | Status: DC
  Administered 2019-05-27 – 2019-06-06 (×10): 1 mg via ORAL
  Filled 2019-05-26 (×10): qty 1

## 2019-05-26 MED ORDER — VANCOMYCIN HCL 2000 MG/400ML IV SOLN
2000.0000 mg | Freq: Once | INTRAVENOUS | Status: AC
  Administered 2019-05-26: 2000 mg via INTRAVENOUS
  Filled 2019-05-26: qty 400

## 2019-05-26 MED ORDER — PRO-STAT SUGAR FREE PO LIQD
30.0000 mL | Freq: Two times a day (BID) | ORAL | Status: DC
  Administered 2019-05-27 – 2019-06-05 (×11): 30 mL via ORAL

## 2019-05-26 MED ORDER — SODIUM CHLORIDE 0.9 % IV SOLN
2.0000 g | INTRAVENOUS | Status: DC
  Administered 2019-05-26: 21:00:00 2 g via INTRAVENOUS
  Filled 2019-05-26: qty 20

## 2019-05-26 MED ORDER — ONDANSETRON HCL 4 MG/2ML IJ SOLN
4.0000 mg | Freq: Once | INTRAMUSCULAR | Status: AC
  Administered 2019-05-26: 4 mg via INTRAVENOUS
  Filled 2019-05-26: qty 2

## 2019-05-26 MED ORDER — MIRTAZAPINE 15 MG PO TABS
30.0000 mg | ORAL_TABLET | Freq: Every day | ORAL | Status: DC
  Administered 2019-05-27 – 2019-06-06 (×10): 30 mg via ORAL
  Filled 2019-05-26 (×11): qty 2

## 2019-05-26 MED ORDER — MORPHINE SULFATE (PF) 4 MG/ML IV SOLN
4.0000 mg | Freq: Once | INTRAVENOUS | Status: AC
  Administered 2019-05-26: 16:00:00 4 mg via INTRAVENOUS
  Filled 2019-05-26: qty 1

## 2019-05-26 MED ORDER — SENNOSIDES-DOCUSATE SODIUM 8.6-50 MG PO TABS: 1.0000 | ORAL_TABLET | Freq: Every evening | ORAL | Status: DC | PRN

## 2019-05-26 MED ORDER — FUROSEMIDE 10 MG/ML IJ SOLN
40.0000 mg | Freq: Two times a day (BID) | INTRAMUSCULAR | Status: DC
  Administered 2019-05-27 – 2019-05-29 (×4): 40 mg via INTRAVENOUS
  Filled 2019-05-26 (×5): qty 4

## 2019-05-26 NOTE — ED Notes (Signed)
This RN contacted NP Randol Kern about pt's low bp. New orders placed for albumin.

## 2019-05-26 NOTE — Progress Notes (Signed)
PHARMACY -  BRIEF ANTIBIOTIC NOTE   Pharmacy has received consult(s) for Vancomycin from an ED provider.  The patient's profile has been reviewed for ht/wt/allergies/indication/available labs.    One time order(s) placed for Vancomycin 2500 mg IV X 1  Further antibiotics/pharmacy consults should be ordered by admitting physician if indicated.                       Thank you, Camryn Lampson D 05/26/2019  3:40 PM

## 2019-05-26 NOTE — ED Notes (Signed)
Pt is alert. Pt wakes up and will talk to you. Pt states it is January 2020.

## 2019-05-26 NOTE — ED Provider Notes (Signed)
Patient is alert, oriented.  Oriented to being at the hospital as well as to her name.  She appears to be resting comfortably denies any acute concern.  Respirations even and unlabored.   Delman Kitten, MD 05/26/19 [phone]

## 2019-05-26 NOTE — ED Provider Notes (Signed)
[facility] Emergency Department Provider Note   ____________________________________________   First MD Initiated Contact with Patient 05/26/19 1518     (approximate)  I have reviewed the triage vital signs and the nursing notes.   HISTORY  Chief Complaint Fall    HPI [name] is a 64 y.o. female for evaluation for right foot pain fatigue  Difficulty walking.  3 days of significant pain in her right foot.  Possible injury.  Transfer self from bed to chair.  Husband immobile as well, difficulty with assistance at home.  Swelling weeping and drainage from both legs.  No chest pain.  Mild shortness of breath, reports chronic  No noted bleeding.  Reports a fairly significant to severe sharp pain mostly in her right foot right ankle area with movement or any attempt to bear weight.  Decreased sensation in both feet chronic.  Able to move the foot but it hurts a lot.  Malaise in bed most all day.  Reports recent admission couple weeks ago   Past Medical History:  Diagnosis Date  . Cervical cancer ([place])   . CHF (congestive heart failure) ([place])   . Chronic anemia   . COPD (chronic obstructive pulmonary disease) ([place])   . Diabetes mellitus without complication ([place])   . Gastric ulcer   . NSTEMI (non-ST elevated myocardial infarction) ([place])   . Upper GI bleed     Patient Active Problem List   Diagnosis Date Noted  . Moderate mitral regurgitation by prior echocardiogram 05/04/2019  . Moderate - severe tricuspid regurgitation by prior echocardiogram 05/04/2019  . CKD (chronic kidney disease) stage 3, GFR 30-59 ml/min 05/04/2019  . HCV antibody positive 05/04/2019  . Other cirrhosis of liver ([place]) 05/04/2019  . Fall 02/24/2019  . UTI (urinary tract infection) 02/23/2019  . AVM (arteriovenous malformation) of small bowel, acquired with hemorrhage   . Epistaxis 11/29/2018  . Altered mental status   . Heme positive stool   . Gastritis and  gastroduodenitis   . Encephalopathy acute   . Acute on chronic blood loss anemia 11/20/2018  . Pressure injury of skin 10/26/2018  . Anxiety 08/15/2018  . Chronic back pain 08/15/2018  . GERD (gastroesophageal reflux disease) 08/15/2018  . History of carpal tunnel syndrome 08/15/2018  . HNP (herniated nucleus pulposus), lumbar 08/15/2018  . Hyperlipidemia 08/15/2018  . Hypertension 08/15/2018  . Insomnia 08/15/2018  . Left ventricular hypertrophy 08/15/2018  . Obesity 08/15/2018  . Sleep apnea 08/15/2018  . Severe anemia 06/20/2018  . Diabetic polyneuropathy associated with type 2 diabetes mellitus ([place]) 02/23/2018  . Moderate tricuspid regurgitation 02/18/2018  . Acute on chronic diastolic CHF (congestive heart failure) ([place]) 02/08/2018  . 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 Depression 02/05/2018  . Generalized abdominal pain 02/05/2018  . CHF (congestive heart failure) ([place]) 01/13/2018  . B12 deficiency 12/07/2017  . Iron deficiency anemia due to chronic blood loss 12/07/2017  . Folate deficiency 12/07/2017  . Upper GI bleed 11/03/2017  . COPD (chronic obstructive pulmonary disease) ([place]) 09/26/2017  . Symptomatic anemia 09/26/2017  . Anemia   . Weakness   . Lower GI bleeding 08/07/2017  . Chronic systolic heart failure ([place]) 12/28/2016  . Tobacco use 12/28/2016  . NSTEMI (non-ST elevated myocardial infarction) ([place])   . Sepsis ([place]) 12/18/2016  . CHF exacerbation ([place]) 12/04/2015  . PNA (pneumonia) 10/28/2015  . Chronic hepatitis C without hepatic coma ([place]) 05/21/2014  . CAD (coronary artery disease) 07/19/2013  . Right arm  pain 06/26/2013  . Cervical pain (neck) 03/28/2013  . Radiculopathy affecting upper extremity 03/28/2013  . Impaired renal function 12/21/2012  . Abnormal mammogram 12/20/2012  . Diabetes mellitus type 2, uncontrolled ([place]) 12/20/2012    Past Surgical History:  Procedure Laterality Date  . AMPUTATION Left 06/24/2018   Procedure:  AMPUTATION GREAT TOE;  Surgeon: Samara Deist, DPM;  Location: [facility]S;  Service: Podiatry;  Laterality: Left;  . APPENDECTOMY    . BIOPSY  11/22/2018   Procedure: BIOPSY;  Surgeon: Thornton Park, MD;  Location: [place];  Service: Gastroenterology;;  . CARDIAC CATHETERIZATION    . CHOLECYSTECTOMY    . COLONOSCOPY N/A 09/28/2017   Procedure: COLONOSCOPY;  Surgeon: Toledo, Benay Pike, MD;  Location: [facility] ENDOSCOPY;  Service: Gastroenterology;  Laterality: N/A;  . COLONOSCOPY WITH PROPOFOL N/A 08/21/2018   Procedure: COLONOSCOPY WITH PROPOFOL;  Surgeon: Lin Landsman, MD;  Location: [facility] ENDOSCOPY;  Service: Gastroenterology;  Laterality: N/A;  . COLONOSCOPY WITH PROPOFOL N/A 12/28/2018   Procedure: COLONOSCOPY WITH PROPOFOL;  Surgeon: Rush Landmark Telford Nab., MD;  Location: [facility] ENDOSCOPY;  Service: Gastroenterology;  Laterality: N/A;  . ENTEROSCOPY Left 11/06/2017   Procedure: ENTEROSCOPY;  Surgeon: Jonathon Bellows, MD;  Location: [facility] ENDOSCOPY;  Service: Gastroenterology;  Laterality: Left;  . ENTEROSCOPY N/A 06/22/2018   Procedure: ENTEROSCOPY;  Surgeon: Lin Landsman, MD;  Location: [facility] ENDOSCOPY;  Service: Gastroenterology;  Laterality: N/A;  . ENTEROSCOPY N/A 08/18/2018   Procedure: ENTEROSCOPY;  Surgeon: Jonathon Bellows, MD;  Location: [facility] ENDOSCOPY;  Service: Gastroenterology;  Laterality: N/A;  . ENTEROSCOPY N/A 11/01/2018   Procedure: ENTEROSCOPY;  Surgeon: Virgel Manifold, MD;  Location: [facility] ENDOSCOPY;  Service: Endoscopy;  Laterality: N/A;  . ENTEROSCOPY N/A 11/22/2018   Procedure: ENTEROSCOPY;  Surgeon: Thornton Park, MD;  Location: [place];  Service: Gastroenterology;  Laterality: N/A;  . ENTEROSCOPY Left 12/17/2018   Procedure: ENTEROSCOPY;  Surgeon: Yetta Flock, MD;  Location: [facility] ENDOSCOPY;  Service: Gastroenterology;  Laterality: Left;  . ENTEROSCOPY N/A 12/19/2018   Procedure: ENTEROSCOPY;  Surgeon: Yetta Flock, MD;  Location: [facility]  ENDOSCOPY;  Service: Gastroenterology;  Laterality: N/A;  . ENTEROSCOPY N/A 12/20/2018   Procedure: ENTEROSCOPY;  Surgeon: Lavena Bullion, DO;  Location: [facility] ENDOSCOPY;  Service: Gastroenterology;  Laterality: N/A;  . ESOPHAGOGASTRODUODENOSCOPY N/A 12/19/2016   Procedure: ESOPHAGOGASTRODUODENOSCOPY (EGD);  Surgeon: Lin Landsman, MD;  Location: [facility]COPY;  Service: Gastroenterology;  Laterality: N/A;  . ESOPHAGOGASTRODUODENOSCOPY N/A 09/28/2017   Procedure: ESOPHAGOGASTRODUODENOSCOPY (EGD);  Surgeon: Toledo, Benay Pike, MD;  Location: [facility] ENDOSCOPY;  Service: Gastroenterology;  Laterality: N/A;  . ESOPHAGOGASTRODUODENOSCOPY (EGD) WITH PROPOFOL N/A 08/08/2017   Procedure: ESOPHAGOGASTRODUODENOSCOPY (EGD) WITH PROPOFOL;  Surgeon: [name], MD;  Location: [facility] ENDOSCOPY;  Service: Endoscopy;  Laterality: N/A;  . GIVENS CAPSULE STUDY N/A 10/28/2018   Procedure: GIVENS CAPSULE STUDY;  Surgeon: Jonathon Bellows, MD;  Location: [facility] ENDOSCOPY;  Service: Gastroenterology;  Laterality: N/A;  . GIVENS CAPSULE STUDY N/A 12/26/2018   Procedure: GIVENS CAPSULE STUDY;  Surgeon: Rush Landmark Telford Nab., MD;  Location: [facility];  Service: Gastroenterology;  Laterality: N/A;  . [place]    . HEMOSTASIS CLIP PLACEMENT  12/19/2018   Procedure: HEMOSTASIS CLIP PLACEMENT;  Surgeon: Yetta Flock, MD;  Location[facility];  Service: Gastroenterology;;  . HEMOSTASIS CLIP PLACEMENT  12/28/2018   Procedure: HEMOSTASIS CLIP PLACEMENT;  Surgeon: Irving Copas., MD;  Location: [facility];  Service: Gastroenterology;;  . HOT HEMOSTASIS N/A 12/17/2018   Procedure: HOT  HEMOSTASIS (ARGON PLASMA COAGULATION/BICAP);  Surgeon: Yetta Flock, MD;  Location: [facility];  Service: Gastroenterology;  Laterality: N/A;  . HOT HEMOSTASIS N/A 12/28/2018   Procedure: HOT HEMOSTASIS (ARGON PLASMA COAGULATION/BICAP);  Surgeon: Irving Copas., MD;  Location: [facility];  Service: Gastroenterology;  Laterality: N/A;  . LEFT HEART CATH AND CORONARY ANGIOGRAPHY N/A 12/23/2016   Procedure: LEFT HEART CATH AND CORONARY ANGIOGRAPHY;  Surgeon: Teodoro Spray, MD;  Location: [place] CV LAB;  Service: Cardiovascular;  Laterality: N/A;  . LOWER EXTREMITY ANGIOGRAPHY Left 06/26/2018   Procedure: Lower Extremity Angiography;  Surgeon: Katha Cabal, MD;  Location: [place] [facility];  Service: Cardiovascular;  Laterality: Left;  . SUBMUCOSAL INJECTION  12/19/2018   Procedure: SUBMUCOSAL INJECTION;  Surgeon: Yetta Flock, MD;  Location: [facility];  Service: Gastroenterology;;  . Lia Foyer TATTOO INJECTION  12/17/2018   Procedure: SUBMUCOSAL TATTOO INJECTION;  Surgeon: Yetta Flock, MD;  Location: [facility];  Service: Gastroenterology;;  . Lia Foyer TATTOO INJECTION  12/28/2018   Procedure: SUBMUCOSAL TATTOO INJECTION;  Surgeon: Irving Copas., MD;  Location: [facility] ENDOS[facility];  Service: Gastroenterology;;  . TEE WITHOUT CARDIOVERSION N/A 10/29/2018   Procedure: TRANSESOPHAGEAL ECHOCARDIOGRAM (TEE);  Surgeon: Teodoro Spray, MD;  Location: [facility] OR[facility];  Service: Cardiovascular;  Laterality: N/A;    Prior to Admission medications   Medication Sig Start Date End Date Taking? Authorizing Provider  Amino Acids-Protein Hydrolys (FEEDING SUPPLEMENT, PRO-STAT SUGAR FREE 64,) LIQD Take 30 mLs by mouth 2 (two) times daily. 12/07/18   Al Decant, MD  ammonium lactate (AMLACTIN) 12 % cream Apply 1 g topically 2 (two) times daily.    [provider]  atorvastatin (LIPITOR) 40 MG tablet Take 40 mg by mouth daily.    [provider]  carvedilol (COREG) 3.125 MG tablet Take 1 tablet (3.125 mg total) by mouth 2 (two) times daily with a meal. 08/21/18   Stark Jock, Jude, MD  collagenase (SANTYL) ointment Apply topically daily. 05/10/19   Fritzi Mandes, MD  ferrous sulfate 325 (65 FE) MG tablet Take 1 tablet (325 mg total) by mouth 2  (two) times daily with a meal. 05/10/19   Fritzi Mandes, MD  fluticasone (FLONASE) 50 MCG/ACT nasal spray Place 2 sprays into both nostrils daily.    [provider]  folic acid (FOLVITE) 1 MG tablet Take 1 tablet (1 mg total) by mouth daily. 08/21/18   Stark Jock Jude, MD  lactulose (CHRONULAC) 10 GM/15ML solution Take 30 mLs (20 g total) by mouth 3 (three) times daily. 12/07/18   Al Decant, MD  mirtazapine (REMERON) 30 MG tablet Take 30 mg by mouth at bedtime. 03/29/19   [provider]  Multiple Vitamin (MULTIVITAMIN WITH MINERALS) TABS tablet Take 1 tablet by mouth daily. 06/28/18   Lang Snow, NP  Nutritional Supplements (RESOURCE 2.0 PO) Take 1 Bottle by mouth 2 (two) times daily.    [provider]  senna-docusate (SENOKOT-S) 8.6-50 MG tablet Take 1 tablet by mouth at bedtime as needed for mild constipation. 01/01/19   Sheth, Vickii Chafe, MD  spironolactone (ALDACTONE) 25 MG tablet Take 12.5 mg by mouth daily.    [provider]  torsemide (DEMADEX) 20 MG tablet Take 1 tablet (20 mg total) by mouth daily. 05/10/19   Fritzi Mandes, MD    Allergies Latex and Gabapentin  Family History  Problem Relation Age of Onset  . Hypertension Mother   . Diabetes Mellitus II Mother   . Breast cancer  Mother   . Heart disease Father   . Cancer Sister     Social History Social History   Tobacco Use  . Smoking status: Current Some Day Smoker    Packs/day: 0.25    Types: Cigarettes  . Smokeless tobacco: Never Used  Substance Use Topics  . Alcohol use: No  . Drug use: No    Review of Systems Constitutional: No fever/chills Eyes: No visual changes. ENT: No sore throat. Cardiovascular: Denies chest pain. Respiratory: Denies shortness of breath. Gastrointestinal: No abdominal pain.   Genitourinary: Negative for dysuria. Musculoskeletal: Reports severe pain with any movement the right foot and ankle region.  Left foot no pain.  She thinks she also fell a  couple days ago twisted underneath her.  Did not hit her head. Skin: Weeping and drainage from both her lower leg surgical swollen Neurological: Negative for headaches, areas of focal weakness or numbness.    ____________________________________________   PHYSICAL EXAM:  VITAL SIGNS: ED Triage Vitals [05/26/19 1459]  Enc Vitals Group     BP      Pulse      Resp      Temp 97.8 F (36.6 C)     Temp Source Oral     SpO2      Weight 243 lb 12.8 oz (110.6 kg)     Height 5' 5"  (1.651 m)     Head Circumference      Peak Flow      Pain Score 8     Pain Loc      Pain Edu?      Excl. in [place]?      Constitutional: Alert and oriented.  Patient is slightly dyspneic.  Laying in bed pleasantly.  Very well oriented. Eyes: Conjunctivae are normal. Head: Atraumatic. Nose: No congestion/rhinnorhea. Mouth/Throat: Mucous membranes are moist. Neck: No stridor.  Cardiovascular: Normal rate, regular rhythm. Grossly normal heart sounds.  Good peripheral circulation.  Bilateral lower extremities severely edematous. Respiratory: Normal respiratory effort.  No retractions. Lungs CTAB. Gastrointestinal: Soft and nontender. No distention. Musculoskeletal: No lower extremity tenderness with weeping from both lower extremities, some clear drainage from both, the right lower extremity appears to have some small areas that appear to be purulent overlying the anterior shin.  What appear to be some unroofed blisters or ulcers of what appear to be severe venous stasis possibly superinfected primarily involving the right lower leg anteriorly.  Pulses dopplerable posterior tibial bilateral.  Tender to touch across the bridge of the right foot as well as distal tib-fib region though no acute deformity is noted.  No open fractures or bleeding. Neurologic:  Normal speech and language. No gross focal neurologic deficits are appreciated.  Skin:  Skin is warm, dry and intact. No rash noted. Psychiatric: Mood and affect  are normal. Speech and behavior are normal.  ____________________________________________   LABS (all labs ordered are listed, but only abnormal results are displayed)  Labs Reviewed  BRAIN NATRIURETIC PEPTIDE - Abnormal; Notable for the following components:      Result Value   B Natriuretic Peptide 827.0 (*)    All other components within normal limits  BASIC METABOLIC PANEL - Abnormal; Notable for the following components:   Sodium 130 (*)    CO2 18 (*)    Glucose, Bld 314 (*)    BUN 28 (*)    Creatinine, Ser 1.88 (*)    Calcium 8.3 (*)    GFR calc non Af Amer 28 (*)  GFR calc Af Amer 32 (*)    All other components within normal limits  CBC WITH DIFFERENTIAL/PLATELET - Abnormal; Notable for the following components:   RBC 3.57 (*)    Hemoglobin 7.8 (*)    HCT 27.1 (*)    MCV 75.9 (*)    MCH 21.8 (*)    MCHC 28.8 (*)    RDW 22.3 (*)    Lymphs Abs 0.2 (*)    All other components within normal limits  RESPIRATORY PANEL BY RT PCR (FLU A&B, COVID)  CULTURE, BLOOD (ROUTINE X 2)  CULTURE, BLOOD (ROUTINE X 2)  CK  HEMOGLOBIN A1C  HIV ANTIBODY (ROUTINE TESTING W REFLEX)  SEDIMENTATION RATE  C-REACTIVE PROTEIN  PREALBUMIN  BASIC METABOLIC PANEL   ____________________________________________  EKG  Reviewed interpreted at 1500 Heart rate 99 QRS 159 QTc 500 Sinus rhythm, right bundle branch block.  Possible left ventricular hypertrophy no evidence of acute ischemia ____________________________________________  RADIOLOGY  DG Tibia/Fibula Right  Result Date: 05/26/2019 CLINICAL DATA:  Pain status post fall EXAM: RIGHT TIBIA AND FIBULA - 2 VIEW COMPARISON:  None. FINDINGS: There is soft tissue swelling about the lower extremity. The patient is status post prior plate screw fixation of the distal fibula. The hardware appears grossly intact with evidence for some loosening of the single transsyndesmotic screw. There may be a small suprapatellar joint effusion. There is no  acute displaced fracture or dislocation. IMPRESSION: 1. No acute osseous abnormality. 2. There is nonspecific lower extremity edema. Electronically Signed   By: Constance Holster M.D.   On: 05/26/2019 16:27   DG Chest Port 1 View  Result Date: 05/26/2019 CLINICAL DATA:  Pain EXAM: PORTABLE CHEST 1 VIEW COMPARISON:  05/04/2019 FINDINGS: There is significant cardiomegaly. The appearance of the cardiac silhouette is unchanged since prior study. Areas of scarring atelectasis are again noted. The patient is status post prior median sternotomy with valve replacement. There is vascular congestion with mild interstitial pulmonary edema. There is no acute osseous abnormality. IMPRESSION: Cardiomegaly with mild interstitial pulmonary edema. Electronically Signed   By: Constance Holster M.D.   On: 05/26/2019 16:26   DG Foot 2 Views Right  Result Date: 05/26/2019 CLINICAL DATA:  Status post fall. Fatigue. EXAM: RIGHT FOOT - 2 VIEW COMPARISON:  February 2011 FINDINGS: There is no evidence of fracture or dislocation. There is no evidence of arthropathy or other focal bone abnormality. Diffuse soft tissue swelling. Partially visualize fibular fracture fixation. IMPRESSION: 1. No acute fracture or dislocation identified about the right foot. 2. Diffuse soft tissue swelling. Electronically Signed   By: Fidela Salisbury M.D.   On: 05/26/2019 16:27    Imaging reviewed, generally negative for acute bony injury. Chest x-ray with mild interstitial pulmonary edema ____________________________________________   PROCEDURES  Procedure(s) performed: None  Procedures  Critical Care performed: No  ____________________________________________   INITIAL IMPRESSION / ASSESSMENT AND PLAN / ED COURSE  Pertinent labs & imaging results that were available during my care of the patient were reviewed by me and considered in my medical decision making (see chart for details).   Patient presents for leg pain. Vascular  intact. Pain over the right foot lower tib-fib without bony deformity. Suspect likely injury secondary to a fall that she reports a couple days ago. Denies any head strike. Neurologically fully intact. Weeping wounds and appears volume overloaded, suspicious for early infection involving her wounds over the right lower extremity. Will start on broad-spectrum antibiotic, obtain culture.  Clinical Course as of  May 26 1703  Sun May 26, 2019  1636 Admit discussed with Dr. Benny Lennert   [MQ]    Clinical Course User Index [MQ] Delman Kitten, MD   Patient suffers quite a bit from debility, appears volume overloaded, active infection. Will obtain evaluation of the hospitalist service for admission for initiation of antibiotics, need for wound care, and treatment for volume overload  [name] was evaluated in Emergency Department on 05/26/2019 for the symptoms described in the history of present illness. She was evaluated in the context of the global COVID-19 pandemic, which necessitated consideration that the patient might be at risk for infection with the SARS-CoV-2 virus that causes COVID-19. Institutional protocols and algorithms that pertain to the evaluation of patients at risk for COVID-19 are in a state of rapid change based on information released by regulatory bodies including the CDC and federal and state organizations. These policies and algorithms were followed during the patient's care in the ED.  ____________________________________________   FINAL CLINICAL IMPRESSION(S) / ED DIAGNOSES  Final diagnoses:  Ankle edema, bilateral  Hypervolemia, unspecified hypervolemia type        Note:  This document was prepared using Dragon voice recognition software and may include unintentional dictation errors       Delman Kitten, MD 05/26/19 1705

## 2019-05-26 NOTE — ED Notes (Signed)
Date and time results received: 05/26/19 10:29 PM  (use smartphrase ".now" to insert current time)  Test: Lactic Acid Critical Value: 5.2  Name of Provider Notified: NP Randol Kern

## 2019-05-26 NOTE — H&P (Addendum)
[name] is an 64 y.o. female.   Chief Complaint: Oversedation, lower extremity swelling and pain. Pain in right ankle/foot. Anasarca.  HPI: The patient is a morbidly obese, chronically ill appearing 64 yr old woman. She presents to [facility] today with above chief complaints. She was discharged from this facility on 05/10/2019 after a stay for acute on chronic diastolic congestive heart failure with anasarca, acute on chronic anemia, CKD IIIB, hepatitis C with cirrhosis, and chronic venous ulcers in the leg.  All HPI is per ED documentation as the patient is chemically obtunded and unable to participate with history or exam.  Per the ED staff the patient has had difficulty with walking in the past 3 days. She has had significant pain in her right foot. She has some chronic shortness of breath.  In the ED she had pain with movement of the foot. She was afebrilel, HR in the 70's and RR in the low twenties for the most part. Oxygen saturations were in the upper nineties.   Past Medical History:  Diagnosis Date  . Cervical cancer ([place])   . CHF (congestive heart failure) ([place])   . Chronic anemia   . COPD (chronic obstructive pulmonary disease) ([place])   . Diabetes mellitus without complication ([place])   . Gastric ulcer   . NSTEMI (non-ST elevated myocardial infarction) ([place])   . Upper GI bleed     Past Surgical History:  Procedure Laterality Date  . AMPUTATION Left 06/24/2018   Procedure: AMPUTATION GREAT TOE;  Surgeon: Samara Deist, DPM;  Location: [facility];  Service: Podiatry;  Laterality: Left;  . APPENDECTOMY    . BIOPSY  11/22/2018   Procedure: BIOPSY;  Surgeon: Thornton Park, MD;  Location: [place];  Service: Gastroenterology;;  . CARDIAC CATHETERIZATION    . CHOLECYSTECTOMY    . COLONOSCOPY N/A 09/28/2017   Procedure: COLONOSCOPY;  Surgeon: Toledo, Benay Pike, MD;  Location: [facility];  Service: Gastroenterology;  Laterality: N/A;  . COLONOSCOPY WITH PROPOFOL N/A  08/21/2018   Procedure: COLONOSCOPY WITH PROPOFOL;  Surgeon: Lin Landsman, MD;  Location: [facility];  Service: Gastroenterology;  Laterality: N/A;  . COLONOSCOPY WITH PROPOFOL N/A 12/28/2018   Procedure: COLONOSCOPY WITH PROPOFOL;  Surgeon: Rush Landmark Telford Nab., MD;  Location: [facility];  Service: Gastroenterology;  Laterality: N/A;  . ENTEROSCOPY Left 11/06/2017   Procedure: ENTEROSCOPY;  Surgeon: Jonathon Bellows, MD;  Location: [facility] ENDOSCOPY;  Service: Gastroenterology;  Laterality: Left;  . ENTEROSCOPY N/A 06/22/2018   Procedure: ENTEROSCOPY;  Surgeon: Lin Landsman, MD;  Location: [facility] ENDOSCOPY;  Service: Gastroenterology;  Laterality: N/A;  . ENTEROSCOPY N/A 08/18/2018   Procedure: ENTEROSCOPY;  Surgeon: Jonathon Bellows, MD;  Location: [facility] ENDOSCOPY;  Service: Gastroenterology;  Laterality: N/A;  . ENTEROSCOPY N/A 11/01/2018   Procedure: ENTEROSCOPY;  Surgeon: Virgel Manifold, MD;  Location: [facility] ENDOSCOPY;  Service: Endoscopy;  Laterality: N/A;  . ENTEROSCOPY N/A 11/22/2018   Procedure: ENTEROSCOPY;  Surgeon: Thornton Park, MD;  Location: [place];  Service: Gastroenterology;  Laterality: N/A;  . ENTEROSCOPY Left 12/17/2018   Procedure: ENTEROSCOPY;  Surgeon: Yetta Flock, MD;  Location: [facility];  Service: Gastroenterology;  Laterality: Left;  . ENTEROSCOPY N/A 12/19/2018   Procedure: ENTEROSCOPY;  Surgeon: Yetta Flock, MD;  [facility];  Service: Gastroenterology;  Laterality: N/A;  . ENTEROSCOPY N/A 12/20/2018   Procedure: ENTEROSCOPY;  Surgeon: Lavena Bullion, DO;  Location[facility];  Service: Gastroenterology;  Laterality: N/A;  . ESOPHAGOGASTRODUODENOSCOPY N/A 12/19/2016  Procedure: ESOPHAGOGASTRODUODENOSCOPY (EGD);  Surgeon: Lin Landsman, MD;  Location: [facility] ENDOSCOPY;  Service: Gastroenterology;  Laterality: N/A;  . ESOPHAGOGASTRODUODENOSCOPY N/A 09/28/2017   Procedure: ESOPHAGOGASTRODUODENOSCOPY (EGD);   Surgeon: Toledo, Benay Pike, MD;  Location: [facility] ENDOSCOPY;  Service: Gastroenterology;  Laterality: N/A;  . ESOPHAGOGASTRODUODENOSCOPY (EGD) WITH PROPOFOL N/A 08/08/2017   Procedure: ESOPHAGOGASTRODUODENOSCOPY (EGD) WITH PROPOFOL;  Surgeon: [name], MD;  Location: [facility] ENDOSCOPY;  Service: Endoscopy;  Laterality: N/A;  . GIVENS CAPSULE STUDY N/A 10/28/2018   Procedure: GIVENS CAPSULE STUDY;  Surgeon: Jonathon Bellows, MD;  Location: [facility] ENDOSCOPY;  Service: Gastroenterology;  Laterality: N/A;  . GIVENS CAPSULE STUDY N/A 12/26/2018   Procedure: GIVENS CAPSULE STUDY;  Surgeon: Rush Landmark Telford Nab., MD;  [facility];  Service: Gastroenterology;  Laterality: N/A;  . [place]    . HEMOSTASIS CLIP PLACEMENT  12/19/2018   Procedure: HEMOSTASIS CLIP PLACEMENT;  Surgeon: Yetta Flock, MD;  Location[facility];  Service: Gastroenterology;;  . HEMOSTASIS CLIP PLACEMENT  12/28/2018   Procedure: HEMOSTASIS CLIP PLACEMENT;  Surgeon: Irving Copas., MD;  [facility];  Service: Gastroenterology;;  . HOT HEMOSTASIS N/A 12/17/2018   Procedure: HOT HEMOSTASIS (ARGON PLASMA COAGULATION/BICAP);  Surgeon: Yetta Flock, MD;  Location: [facility];  Service: Gastroenterology;  Laterality: N/A;  . HOT HEMOSTASIS N/A 12/28/2018   Procedure: HOT HEMOSTASIS (ARGON PLASMA COAGULATION/BICAP);  Surgeon: Irving Copas., MD;  Location: [facility];  Service: Gastroenterology;  Laterality: N/A;  . LEFT HEART CATH AND CORONARY ANGIOGRAPHY N/A 12/23/2016   Procedure: LEFT HEART CATH AND CORONARY ANGIOGRAPHY;  Surgeon: Teodoro Spray, MD;  Location: [place] CV LAB;  Service: Cardiovascular;  Laterality: N/A;  . LOWER EXTREMITY ANGIOGRAPHY Left 06/26/2018   Procedure: Lower Extremity Angiography;  Surgeon: Katha Cabal, MD;  Location: [place] [facility];  Service: Cardiovascular;  Laterality: Left;  . SUBMUCOSAL INJECTION  12/19/2018    Procedure: SUBMUCOSAL INJECTION;  Surgeon: Yetta Flock, MD;  Location: [facility];  Service: Gastroenterology;;  . Lia Foyer TATTOO INJECTION  12/17/2018   Procedure: SUBMUCOSAL TATTOO INJECTION;  Surgeon: Yetta Flock, MD;  [facility];  Service: Gastroenterology;;  . Lia Foyer TATTOO INJECTION  12/28/2018   Procedure: SUBMUCOSAL TATTOO INJECTION;  Surgeon: Irving Copas., MD;  Location: [facility];  Service: Gastroenterology;;  . TEE WITHOUT CARDIOVERSION N/A 10/29/2018   Procedure: TRANSESOPHAGEAL ECHOCARDIOGRAM (TEE);  Surgeon: Teodoro Spray, MD;  Location: [facility];  Service: Cardiovascular;  Laterality: N/A;    Family History  Problem Relation Age of Onset  . Hypertension Mother   . Diabetes Mellitus II Mother   . Breast cancer Mother   . Heart disease Father   . Cancer Sister    Social History:  reports that she has been smoking cigarettes. She has been smoking about 0.25 packs per day. She has never used smokeless tobacco. She reports that she does not drink alcohol or use drugs. (Not in a hospital admission)   Allergies:  Allergies  Allergen Reactions  . Latex Anaphylaxis and Rash  . Gabapentin Nausea And Vomiting and Other (See Comments)    Review of systems not obtained due to patient factors.   General appearance: morbidly obese and obtunded. Head: Normocephalic, without obvious abnormality, atraumatic Eyes: conjunctivae/corneas clear. PERRL, EOM's intact. Fundi benign. Throat: Lips normal. Pt will not open her mouth for me.  Neck: no adenopathy, no carotid bruit, no JVD, supple, symmetrical, trachea midline and thyroid not enlarged, symmetric, no tenderness/mass/nodules  Resp: No increased work of breathing. No wheezes, rales, or rhonchi. No tactile fremitus Chest wall: no tenderness Cardio: regular rate and rhythm, S1, S2 normal, no murmur, click, rub or gallop GI: soft, non-tender; bowel sounds normal; no masses,  no  organomegaly and morbidly obese Extremities: Right lower extremity is erythematous, warm, and swollen. There is an ulcer on the plantar aspect of the right foot. The left lower extremity is edematouos with weeping open wounds to the left lower extremity.  Pulses: 2+ and symmetric Posterior tibial 2+ and symmetric bilaterally. Dorsalis pedis pulses are diminished bilaterally. Skin: Skin color, texture, turgor normal. No rashes or lesions or with exception to the right lower extremity which is erythematous. Lymph nodes: Cervical, supraclavicular, and axillary nodes normal. Neurologic: Mental status: Alert, oriented, thought content appropriate, alertness: obtunded Incision/Wound: Wound to plantar aspect of the right lower extremity.  Results for orders placed or performed during the hospital encounter of 05/26/19 (from the past 48 hour(s))  Brain natriuretic peptide     Status: Abnormal   Collection Time: 05/26/19  2:57 PM  Result Value Ref Range   B Natriuretic Peptide 827.0 (H) 0.0 - 100.0 pg/mL    Comment: Performed at [facility], [address] [place], [place] [address]  Basic metabolic panel     Status: Abnormal   Collection Time: 05/26/19  2:57 PM  Result Value Ref Range   Sodium 130 (L) 135 - 145 mmol/L   Potassium 4.7 3.5 - 5.1 mmol/L   Chloride 103 98 - 111 mmol/L   CO2 18 (L) 22 - 32 mmol/L   Glucose, Bld 314 (H) 70 - 99 mg/dL    Comment: Glucose reference range applies only to samples taken after fasting for at least 8 hours.   BUN 28 (H) 8 - 23 mg/dL   Creatinine, Ser 1.88 (H) 0.44 - 1.00 mg/dL   Calcium 8.3 (L) 8.9 - 10.3 mg/dL   GFR calc non Af Amer 28 (L) >60 mL/min   GFR calc Af Amer 32 (L) >60 mL/min   Anion gap 9 5 - 15    Comment: Performed at [facility], [address] [place], [place] [address]  CBC with Differential     Status: Abnormal   Collection Time: 05/26/19  2:57 PM  Result Value Ref Range   WBC 6.9 4.0 - 10.5 K/uL   RBC 3.57  (L) 3.87 - 5.11 MIL/uL   Hemoglobin 7.8 (L) 12.0 - 15.0 g/dL    Comment: Reticulocyte Hemoglobin testing may be clinically indicated, consider ordering this additional test [id number]    HCT 27.1 (L) 36.0 - 46.0 %   MCV 75.9 (L) 80.0 - 100.0 fL   MCH 21.8 (L) 26.0 - 34.0 pg   MCHC 28.8 (L) 30.0 - 36.0 g/dL   RDW 22.3 (H) 11.5 - 15.5 %   Platelets 179 150 - 400 K/uL   nRBC 0.0 0.0 - 0.2 %   Neutrophils Relative % 95 %   Neutro Abs 6.5 1.7 - 7.7 K/uL   Lymphocytes Relative 2 %   Lymphs Abs 0.2 (L) 0.7 - 4.0 K/uL   Monocytes Relative 3 %   Monocytes Absolute 0.2 0.1 - 1.0 K/uL   Eosinophils Relative 0 %   Eosinophils Absolute 0.0 0.0 - 0.5 K/uL   Basophils Relative 0 %   Basophils Absolute 0.0 0.0 - 0.1 K/uL   Immature Granulocytes 0 %   Abs Immature Granulocytes 0.02 0.00 - 0.07 K/uL  Comment: Performed at [facility], [address] [place], [place] [address]  Respiratory Panel by RT PCR (Flu A&B, Covid) - Nasopharyngeal Swab     Status: None   Collection Time: 05/26/19  2:57 PM   Specimen: Nasopharyngeal Swab  Result Value Ref Range   SARS Coronavirus 2 by RT PCR NEGATIVE NEGATIVE    Comment: (NOTE) SARS-CoV-2 target nucleic acids are NOT DETECTED. The SARS-CoV-2 RNA is generally detectable in upper respiratoy specimens during the acute phase of infection. The lowest concentration of SARS-CoV-2 viral copies this assay can detect is 131 copies/mL. A negative result does not preclude SARS-Cov-2 infection and should not be used as the sole basis for treatment or other patient management decisions. A negative result may occur with  improper specimen collection/handling, submission of specimen other than nasopharyngeal swab, presence of viral mutation(s) within the areas targeted by this assay, and inadequate number of viral copies (<131 copies/mL). A negative result must be combined with clinical observations, patient history, and epidemiological information.  The expected result is Negative. Fact Sheet for Patients:  PinkCheek.be Fact Sheet for Healthcare Providers:  GravelBags.it This test is not yet ap proved or cleared by the [place] FDA and  has been authorized for detection and/or diagnosis of SARS-CoV-2 by FDA under an Emergency Use Authorization (EUA). This EUA will remain  in effect (meaning this test can be used) for the duration of the COVID-19 declaration under Section 564(b)(1) of the Act, 21 U.S.C. section 360bbb-3(b)(1), unless the authorization is terminated or revoked sooner.    Influenza A by PCR NEGATIVE NEGATIVE   Influenza B by PCR NEGATIVE NEGATIVE    Comment: (NOTE) The Xpert Xpress SARS-CoV-2/FLU/RSV assay is intended as an aid in  the diagnosis of influenza from Nasopharyngeal swab specimens and  should not be used as a sole basis for treatment. Nasal washings and  aspirates are unacceptable for Xpert Xpress SARS-CoV-2/FLU/RSV  testing. Fact Sheet for Patients: PinkCheek.be Fact Sheet for Healthcare Providers: GravelBags.it This test is not yet approved or cleared by the [place] FDA and  has been authorized for detection and/or diagnosis of SARS-CoV-2 by  FDA under an Emergency Use Authorization (EUA). This EUA will remain  in effect (meaning this test can be used) for the duration of the  Covid-19 declaration under Section 564(b)(1) of the Act, 21  U.S.C. section 360bbb-3(b)(1), unless the authorization is  terminated or revoked. Performed at [facility], [address] [place], [place] [address]   CK     Status: None   Collection Time: 05/26/19  2:57 PM  Result Value Ref Range   Total CK 162 38 - 234 U/L    Comment: Performed at [facility], [address] [place],  [address]   @[email or web address] @  Blood pressure 104/62, pulse 81, temperature 97.8 F  (36.6 C), temperature source Oral, resp. rate (!) 22, height 5' 5"  (1.651 m), weight 110.6 kg, SpO2 96 %.    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 ABG is within normal limits. Level  of alertness improved per Dr. Jacqualine Code. Pt will be admitted to stepdown status for close monitoring.  Hypotension: due to opioid pain medication vs sepsis. Hold antihypertensives and diuretics until they recover. May require pressors. Monitor closely.  Cellulitis right lower extremity: From upper thigh/groin to foot. Warm to touch, swollen. Pt will be started on vancomycin and rocephin IV. Will also check doppler of right lower extremity for possible DVT.   Anasarca: Bilateral lower extremity edema. Pulmonary edema, abdominal wall edema. Will diurese when blood pressure will tolerate it.  Acute exacerbation of chronic Grade II diastolic dysfunction: Will diurese when blood pressure will tolerate it.   COPD with restrictive cardiomyopathy: albuterol nebulizer treatments. Keep HOB at 30%.   CKD IIIB: Monitor creatinine, electrolytes, and volume status. Avoid nephrotoxic substances and hypotension.  DM II: Follow blood sugars with FSBS and SSI.   Metabolic acidosis: Will check beta hydroxybutyric acid.   Diabetic foot ulcer: IV vancomycin and rocephin.   OSA: CPAP for sleep  Diabetic polyneuropathy: Consider neurontin if patient is symptomatic in the am.  Morbid Obesity: BMI 40.57. Complicates all cares.  Recommend sensible weight loss through diet and increased activity as guided by her PCP.  I have seen and examined this patient myself. I have spent 78 minutes in her evaluation and care.  DVT Prophylaxis: Heparin CODE STATUS: Full Code Family Communication: None available Disposition: The patient was from home. Anticipated disposition is unknown at this time. The patient will require evaluation by PT/OT to determine a safe discharge plan when she is capable.  Vinicio Lynk 05/26/2019, 5:39 PM

## 2019-05-26 NOTE — ED Triage Notes (Signed)
Pt presents from home via acems with c/o  fall. Pt c/o right foot pain, states this was present prior to fall. Pt denies LOC with fall, but endorses hitting head. Bilateral pitting weeping edema noted to lower legs. Pt currently alert, but lethargic

## 2019-05-26 NOTE — ED Provider Notes (Signed)
Hospitalist called notified me the patient on her assessment was difficult to arouse.  I have just written reevaluate her, she has certainly somnolent now.  She does arouse to voice though, opens her eyes, reports she feels better and does not have any specific complaint but she does appear somnolent.  She was given morphine earlier, and her pupils are midpoint reactive, and she follows commands but does seem rather somnolent falling back asleep very quickly.  We will give a small dose of Narcan, as she does appear a bit oversedated at this point and I do not wish to cause her any respiratory distress or difficulty.  She is not hypoxic or saturation 97% on room air, and does not appear in any distress.  We will continue to monitor closely.  Hospitalist advised they have ordered ABG as well  Repeat glucose ordered   Tanya Kitten, MD 05/26/19 1742

## 2019-05-27 DIAGNOSIS — J42 Unspecified chronic bronchitis: Secondary | ICD-10-CM

## 2019-05-27 DIAGNOSIS — R400 Somnolence: Secondary | ICD-10-CM

## 2019-05-27 DIAGNOSIS — M25472 Effusion, left ankle: Secondary | ICD-10-CM

## 2019-05-27 DIAGNOSIS — Z7189 Other specified counseling: Secondary | ICD-10-CM

## 2019-05-27 DIAGNOSIS — Z515 Encounter for palliative care: Secondary | ICD-10-CM

## 2019-05-27 DIAGNOSIS — M25471 Effusion, right ankle: Secondary | ICD-10-CM

## 2019-05-27 LAB — CBC WITH DIFFERENTIAL/PLATELET
Abs Immature Granulocytes: 0.03 K/uL (ref 0.00–0.07)
Basophils Absolute: 0.1 K/uL (ref 0.0–0.1)
Basophils Relative: 1 %
Eosinophils Absolute: 0 K/uL (ref 0.0–0.5)
Eosinophils Relative: 0 %
HCT: 25.5 % — ABNORMAL LOW (ref 36.0–46.0)
Hemoglobin: 7.3 g/dL — ABNORMAL LOW (ref 12.0–15.0)
Immature Granulocytes: 0 %
Lymphocytes Relative: 1 %
Lymphs Abs: 0.1 K/uL — ABNORMAL LOW (ref 0.7–4.0)
MCH: 21.7 pg — ABNORMAL LOW (ref 26.0–34.0)
MCHC: 28.6 g/dL — ABNORMAL LOW (ref 30.0–36.0)
MCV: 75.7 fL — ABNORMAL LOW (ref 80.0–100.0)
Monocytes Absolute: 0.4 K/uL (ref 0.1–1.0)
Monocytes Relative: 4 %
Neutro Abs: 8.3 K/uL — ABNORMAL HIGH (ref 1.7–7.7)
Neutrophils Relative %: 94 %
Platelets: 152 K/uL (ref 150–400)
RBC: 3.37 MIL/uL — ABNORMAL LOW (ref 3.87–5.11)
RDW: 22.2 % — ABNORMAL HIGH (ref 11.5–15.5)
WBC Morphology: INCREASED
WBC: 8.9 K/uL (ref 4.0–10.5)
nRBC: 0 % (ref 0.0–0.2)

## 2019-05-27 LAB — BLOOD CULTURE ID PANEL (REFLEXED)

## 2019-05-27 LAB — PROTIME-INR
INR: 1.7 — ABNORMAL HIGH (ref 0.8–1.2)
Prothrombin Time: 19.5 seconds — ABNORMAL HIGH (ref 11.4–15.2)

## 2019-05-27 LAB — HEMOGLOBIN A1C
Hgb A1c MFr Bld: 7.4 % — ABNORMAL HIGH (ref 4.8–5.6)
Mean Plasma Glucose: 165.68 mg/dL

## 2019-05-27 LAB — SEDIMENTATION RATE: Sed Rate: 79 mm/hr — ABNORMAL HIGH (ref 0–30)

## 2019-05-27 LAB — BASIC METABOLIC PANEL
Anion gap: 9 (ref 5–15)
BUN: 32 mg/dL — ABNORMAL HIGH (ref 8–23)
CO2: 20 mmol/L — ABNORMAL LOW (ref 22–32)
Calcium: 8.1 mg/dL — ABNORMAL LOW (ref 8.9–10.3)
Chloride: 101 mmol/L (ref 98–111)
Creatinine, Ser: 1.88 mg/dL — ABNORMAL HIGH (ref 0.44–1.00)
GFR calc Af Amer: 32 mL/min — ABNORMAL LOW (ref >60)
GFR calc non Af Amer: 28 mL/min — ABNORMAL LOW (ref >60)
Glucose, Bld: 193 mg/dL — ABNORMAL HIGH (ref 70–99)
Potassium: 4.7 mmol/L (ref 3.5–5.1)
Sodium: 130 mmol/L — ABNORMAL LOW (ref 135–145)

## 2019-05-27 LAB — GLUCOSE, CAPILLARY
Glucose-Capillary: 184 mg/dL — ABNORMAL HIGH (ref 70–99)
Glucose-Capillary: 224 mg/dL — ABNORMAL HIGH (ref 70–99)
Glucose-Capillary: 214 mg/dL — ABNORMAL HIGH (ref 70–99)
Glucose-Capillary: 163 mg/dL — ABNORMAL HIGH (ref 70–99)

## 2019-05-27 LAB — LACTIC ACID, PLASMA
Lactic Acid, Venous: 2.9 mmol/L (ref 0.5–1.9)
Lactic Acid, Venous: 3.3 mmol/L (ref 0.5–1.9)
Lactic Acid, Venous: 2.9 mmol/L (ref 0.5–1.9)
Lactic Acid, Venous: 2.7 mmol/L (ref 0.5–1.9)

## 2019-05-27 LAB — APTT: aPTT: 36 seconds (ref 24–36)

## 2019-05-27 LAB — PREALBUMIN: Prealbumin: 5.7 mg/dL — ABNORMAL LOW (ref 18–38)

## 2019-05-27 LAB — C-REACTIVE PROTEIN: CRP: 21.1 mg/dL — ABNORMAL HIGH (ref <1.0)

## 2019-05-27 MED ORDER — SODIUM CHLORIDE 0.9 % IV BOLUS
250.0000 mL | Freq: Once | INTRAVENOUS | Status: AC
  Administered 2019-05-27: 04:00:00 250 mL via INTRAVENOUS

## 2019-05-27 MED ORDER — SODIUM CHLORIDE 0.9 % IV SOLN: 2.0000 g | INTRAVENOUS | Status: DC

## 2019-05-27 MED ORDER — SODIUM CHLORIDE 0.9 % IV SOLN
2.0000 g | Freq: Two times a day (BID) | INTRAVENOUS | Status: DC
  Administered 2019-05-27: 2 g via INTRAVENOUS
  Filled 2019-05-27 (×3): qty 20

## 2019-05-27 MED ORDER — SODIUM CHLORIDE 0.9 % IV SOLN
2.0000 g | INTRAVENOUS | Status: DC
  Administered 2019-05-28 – 2019-05-29 (×2): 2 g via INTRAVENOUS
  Filled 2019-05-27: qty 20
  Filled 2019-05-27 (×2): qty 2

## 2019-05-27 MED ORDER — SODIUM BICARBONATE 8.4 % IV SOLN
100.0000 meq | INTRAVENOUS | Status: AC
  Administered 2019-05-27: 100 meq via INTRAVENOUS
  Filled 2019-05-27: qty 50

## 2019-05-27 MED ORDER — SODIUM CHLORIDE 0.9 % IV SOLN
100.0000 mg | Freq: Two times a day (BID) | INTRAVENOUS | Status: DC
  Administered 2019-05-27 – 2019-05-29 (×5): 100 mg via INTRAVENOUS
  Filled 2019-05-27 (×6): qty 100

## 2019-05-27 MED ORDER — STERILE WATER FOR INJECTION IV SOLN
INTRAVENOUS | Status: DC
  Filled 2019-05-27 (×6): qty 850

## 2019-05-27 MED ORDER — ENOXAPARIN SODIUM 40 MG/0.4ML ~~LOC~~ SOLN: 40.0000 mg | SUBCUTANEOUS | Status: DC

## 2019-05-27 MED ORDER — ALBUMIN HUMAN 25 % IV SOLN
25.0000 g | INTRAVENOUS | Status: AC
  Administered 2019-05-27: 04:00:00 25 g via INTRAVENOUS
  Filled 2019-05-27: qty 100

## 2019-05-27 MED ORDER — OXYCODONE HCL 5 MG PO TABS
5.0000 mg | ORAL_TABLET | ORAL | Status: DC | PRN
  Administered 2019-05-27: 5 mg via ORAL
  Filled 2019-05-27 (×2): qty 1

## 2019-05-27 MED ORDER — SODIUM CHLORIDE 0.9 % IV BOLUS (SEPSIS)
1000.0000 mL | Freq: Once | INTRAVENOUS | Status: AC
  Administered 2019-05-27: 1000 mL via INTRAVENOUS

## 2019-05-27 MED ORDER — ENOXAPARIN SODIUM 40 MG/0.4ML ~~LOC~~ SOLN
40.0000 mg | SUBCUTANEOUS | Status: DC
  Administered 2019-05-27 – 2019-05-28 (×2): 40 mg via SUBCUTANEOUS
  Filled 2019-05-27 (×2): qty 0.4

## 2019-05-27 NOTE — ED Notes (Signed)
Provider notified of BP of 124/67 and that lasix was held

## 2019-05-27 NOTE — Progress Notes (Signed)
PHARMACY - PHYSICIAN COMMUNICATION CRITICAL VALUE ALERT - BLOOD CULTURE IDENTIFICATION (BCID)  [name] is an 64 y.o. female who presented to [facility] on 05/26/2019 with a chief complaint of LE swelling pain w/ anascarca  Assessment:  PH 7.32, bicarb 18.0, LA 5.2 >> 2.9, sed rate 79, 4/4 GPC BCID Strep species.  Name of physician (or Provider) Contacted: Sharion Settler  Current antibiotics: vanc/ceftriaxone/flagyl  Changes to prescribed antibiotics recommended:  Recommendations accepted by provider -- will d/c'd vanc, recommended to increase rocephin 2g IV q12h considering patient is critical and is acutely decompensating d/t Strep bacteremia, doxy added on by provider, provider also wants to continue flagyl.  Results for orders placed or performed during the hospital encounter of 05/26/19  Blood Culture ID Panel (Reflexed) (Collected: 05/26/2019  2:57 PM)  Result Value Ref Range   Enterococcus species NOT DETECTED NOT DETECTED   Listeria monocytogenes NOT DETECTED NOT DETECTED   Staphylococcus species NOT DETECTED NOT DETECTED   Staphylococcus aureus (BCID) NOT DETECTED NOT DETECTED   Streptococcus species DETECTED (A) NOT DETECTED   Streptococcus agalactiae NOT DETECTED NOT DETECTED   Streptococcus pneumoniae NOT DETECTED NOT DETECTED   Streptococcus pyogenes NOT DETECTED NOT DETECTED   Acinetobacter baumannii NOT DETECTED NOT DETECTED   Enterobacteriaceae species NOT DETECTED NOT DETECTED   Enterobacter cloacae complex NOT DETECTED NOT DETECTED   Escherichia coli NOT DETECTED NOT DETECTED   Klebsiella oxytoca NOT DETECTED NOT DETECTED   Klebsiella pneumoniae NOT DETECTED NOT DETECTED   Proteus species NOT DETECTED NOT DETECTED   Serratia marcescens NOT DETECTED NOT DETECTED   Haemophilus influenzae NOT DETECTED NOT DETECTED   Neisseria meningitidis NOT DETECTED NOT DETECTED   Pseudomonas aeruginosa NOT DETECTED NOT DETECTED   Candida albicans NOT DETECTED NOT DETECTED    Candida glabrata NOT DETECTED NOT DETECTED   Candida krusei NOT DETECTED NOT DETECTED   Candida parapsilosis NOT DETECTED NOT DETECTED   Candida tropicalis NOT DETECTED NOT DETECTED   Tobie Lords, PharmD, BCPS Clinical Pharmacist 05/27/2019  7:45 AM

## 2019-05-27 NOTE — Consult Note (Addendum)
[facility] Nurse Consult Note: Reason for Consult: Consult requested for BLE.   Left anterior calf with partial thickness wounds in patchy area; approx affected area is 5X6X.1cm, red and dry  Right anterior leg with partial thickness in patchy area; approx affected area is 13X7cm, 100% yellow and moist with mod amt yellow drainage, no odor Right plantar foot with red dry callous, no open wound, 1X1X.1cm Right plantar great toe with dark dry intact callous, .2X.2cm Dressing procedure/placement/frequency: No topical treatment is needed for callous areas. Orders provided for bedside nurses to perform as follows:  Foam dressing to left leg, change Q 3 days or PRN soiling. Apply xeroform gauze to right leg Q day, then cover with ABD pads and kerlex Please re-consult if further assistance is needed.  Thank-you,  Julien Girt MSN, [place], [place], [place], [place]

## 2019-05-27 NOTE — ED Notes (Signed)
Pt drinking ensure and pt readjusted in bed. Pt tolerating PO intake.

## 2019-05-27 NOTE — ED Notes (Signed)
Nurse Olen Cordial informed of assigned bed

## 2019-05-27 NOTE — ED Notes (Addendum)
Provider, Sharion Settler, notified of BP of 88/49 with a MAP of 61

## 2019-05-27 NOTE — ED Notes (Signed)
Albumin changed from left AC to left hand

## 2019-05-27 NOTE — ED Notes (Signed)
Provider came to ED and placed orders for hypotension. Pt is laying in bed. Both side rails up. Please see MAR for medications administered

## 2019-05-27 NOTE — TOC Initial Note (Signed)
Transition of Care [facility]) - Initial/Assessment Note    Patient Details  Name: [name] MRN: [id number] Date of Birth: Feb 23, 1955  Transition of Care [facility]) CM/SW Contact:    Anselm Pancoast, RN Phone Number: 05/27/2019, 11:50 AM  Clinical Narrative:                 Attempted to complete assessment however patient was sleeping and unable to stay awake to answer any questions. Only response was patient asking for a meal stating she was very hungry. Writer notified ED RN regarding patients request for meal tray.         Patient Goals and CMS Choice        Expected Discharge Plan and Services                                                Prior Living Arrangements/Services                       Activities of Daily Living      Permission Sought/Granted                  Emotional Assessment              Admission diagnosis:  Decreased level of consciousness [R41.89] Sepsis ([place]) [A41.9] Patient Active Problem List   Diagnosis Date Noted  . Anasarca 05/26/2019  . Cellulitis 05/26/2019  . Diabetic foot ulcer ([place]) 05/26/2019  . Decreased level of consciousness 05/26/2019  . Moderate mitral regurgitation by prior echocardiogram 05/04/2019  . Moderate - severe tricuspid regurgitation by prior echocardiogram 05/04/2019  . CKD (chronic kidney disease) stage 3, GFR 30-59 ml/min 05/04/2019  . HCV antibody positive 05/04/2019  . Other cirrhosis of liver ([place]) 05/04/2019  . Fall 02/24/2019  . UTI (urinary tract infection) 02/23/2019  . AVM (arteriovenous malformation) of small bowel, acquired with hemorrhage   . Epistaxis 11/29/2018  . Altered mental status   . Heme positive stool   . Gastritis and gastroduodenitis   . Encephalopathy acute   . Acute on chronic blood loss anemia 11/20/2018  . Pressure injury of skin 10/26/2018  . Anxiety 08/15/2018  . Chronic back pain 08/15/2018  . GERD (gastroesophageal reflux disease) 08/15/2018   . History of carpal tunnel syndrome 08/15/2018  . HNP (herniated nucleus pulposus), lumbar 08/15/2018  . Hyperlipidemia 08/15/2018  . Hypertension 08/15/2018  . Insomnia 08/15/2018  . Left ventricular hypertrophy 08/15/2018  . Obesity 08/15/2018  . Sleep apnea 08/15/2018  . Severe anemia 06/20/2018  . Diabetic polyneuropathy associated with type 2 diabetes mellitus ([place]) 02/23/2018  . Moderate tricuspid regurgitation 02/18/2018  . Acute on chronic diastolic CHF (congestive heart failure) ([place]) 02/08/2018  . 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 Depression 02/05/2018  . Generalized abdominal pain 02/05/2018  . CHF (congestive heart failure) ([place]) 01/13/2018  . B12 deficiency 12/07/2017  . Iron deficiency anemia due to chronic blood loss 12/07/2017  . Folate deficiency 12/07/2017  . Upper GI bleed 11/03/2017  . COPD (chronic obstructive pulmonary disease) ([place]) 09/26/2017  . Symptomatic anemia 09/26/2017  . Anemia   . Weakness   . Lower GI bleeding 08/07/2017  . Chronic systolic heart failure ([place]) 12/28/2016  . Tobacco use 12/28/2016  . NSTEMI (non-ST elevated myocardial infarction) ([place])   . Sepsis ([place]) 12/18/2016  . CHF  exacerbation ([place]) 12/04/2015  . PNA (pneumonia) 10/28/2015  . Chronic hepatitis C without hepatic coma ([place]) 05/21/2014  . CAD (coronary artery disease) 07/19/2013  . Right arm pain 06/26/2013  . Cervical pain (neck) 03/28/2013  . Radiculopathy affecting upper extremity 03/28/2013  . Impaired renal function 12/21/2012  . Abnormal mammogram 12/20/2012  . Diabetes mellitus type 2, uncontrolled ([place]) 12/20/2012   PCP:  [facility]:   [place], [place], [address] [address] Phone: [phone] Fax: [place], [place] - [address] [place] [address] Phone: [phone] Fax: [phone]  [id number] [id number]: [phone]: [phone]    Social Determinants of Health (SDOH) Interventions    Readmission Risk Interventions Readmission Risk Prevention Plan 02/26/2019 12/17/2018 10/26/2018  Transportation Screening Complete Complete Complete  PCP or Specialist Appt within 5-7 Days - - -  Home Care Screening - - -  Medication Review (RN CM) - - -  HRI or [place]- - Complete  Social Work Consult for RFertilePlanning/Counseling - - Not Complete  SW consult not completed comments - - not recovery care  Palliative Care Screening - - Not Applicable  Medication Review (RN Care Manager) Complete Referral to Pharmacy Referral to Pharmacy  PCP or Specialist appointment within 3-5 days of discharge (No Data) Not Complete -  PCP/Specialist Appt Not Complete comments - DC date unknown but pt is established with providers -  H[place] Home Care Consult Complete Not Complete Complete  HRI or Home Care Consult Pt Refusal Comments - SNF resident -  SW Recovery Care/Counseling Consult - Complete -  Palliative Care Screening Not Applicable Not Complete Not Applicable  Comments - pending need -  SRetsofPatient Refused Complete -  Some recent data might be hidden

## 2019-05-27 NOTE — ED Notes (Signed)
Pt given warm blankets and is resting comfortably.

## 2019-05-28 DIAGNOSIS — Z7189 Other specified counseling: Secondary | ICD-10-CM

## 2019-05-28 DIAGNOSIS — Z515 Encounter for palliative care: Secondary | ICD-10-CM

## 2019-05-28 LAB — BASIC METABOLIC PANEL
Anion gap: 11 (ref 5–15)
BUN: 45 mg/dL — ABNORMAL HIGH (ref 8–23)
CO2: 21 mmol/L — ABNORMAL LOW (ref 22–32)
Calcium: 8.2 mg/dL — ABNORMAL LOW (ref 8.9–10.3)
Chloride: 102 mmol/L (ref 98–111)
Creatinine, Ser: 2.28 mg/dL — ABNORMAL HIGH (ref 0.44–1.00)
GFR calc Af Amer: 25 mL/min — ABNORMAL LOW (ref >60)
GFR calc non Af Amer: 22 mL/min — ABNORMAL LOW (ref >60)
Glucose, Bld: 197 mg/dL — ABNORMAL HIGH (ref 70–99)
Potassium: 4.6 mmol/L (ref 3.5–5.1)
Sodium: 134 mmol/L — ABNORMAL LOW (ref 135–145)

## 2019-05-28 LAB — GLUCOSE, CAPILLARY
Glucose-Capillary: 172 mg/dL — ABNORMAL HIGH (ref 70–99)
Glucose-Capillary: 195 mg/dL — ABNORMAL HIGH (ref 70–99)
Glucose-Capillary: 215 mg/dL — ABNORMAL HIGH (ref 70–99)
Glucose-Capillary: 154 mg/dL — ABNORMAL HIGH (ref 70–99)

## 2019-05-28 LAB — HIV ANTIBODY (ROUTINE TESTING W REFLEX): HIV Screen 4th Generation wRfx: NONREACTIVE

## 2019-05-28 MED ORDER — SODIUM CHLORIDE 0.9 % IV BOLUS (SEPSIS)
1000.0000 mL | Freq: Once | INTRAVENOUS | Status: AC
  Administered 2019-05-28: 1000 mL via INTRAVENOUS

## 2019-05-28 MED ORDER — ASCORBIC ACID 500 MG PO TABS
250.0000 mg | ORAL_TABLET | Freq: Two times a day (BID) | ORAL | Status: DC
  Administered 2019-05-28 – 2019-06-06 (×17): 250 mg via ORAL
  Filled 2019-05-28 (×17): qty 1

## 2019-05-28 MED ORDER — ENSURE ENLIVE PO LIQD
237.0000 mL | Freq: Two times a day (BID) | ORAL | Status: DC
  Administered 2019-05-29 – 2019-06-06 (×12): 237 mL via ORAL

## 2019-05-28 MED ORDER — TRAMADOL HCL 50 MG PO TABS
50.0000 mg | ORAL_TABLET | Freq: Four times a day (QID) | ORAL | Status: DC | PRN
  Administered 2019-05-28 – 2019-06-05 (×10): 50 mg via ORAL
  Filled 2019-05-28 (×10): qty 1

## 2019-05-28 NOTE — Progress Notes (Signed)
PROGRESS NOTE  [name] IOX:[id number] DOB: [birth year] DOA: 05/26/2019 PCP: [place] [facility]  Brief History    The patient is a morbidly obese, chronically ill appearing 64 yr old woman. She [facility] to [facility] today with above chief complaints. She was discharged from this facility on 05/10/2019 after a stay for acute on chronic diastolic congestive heart failure with anasarca, acute on chronic anemia, CKD IIIB, hepatitis C with cirrhosis, and chronic venous ulcers in the leg.  All HPI is per ED documentation as the patient is chemically obtunded and unable to participate with history or exam.  Per the ED staff the patient has had difficulty with walking in the past 3 days. She has had significant pain in her right foot. She has some chronic shortness of breath.  In the ED she had pain with movement of the foot. She was afebrilel, HR in the 70's and RR in the low twenties for the most part. Oxygen saturations were in the upper nineties.  Consultants  . Palliative care . Wound Care  Procedures  . None  Antibiotics   Anti-infectives (From admission, onward)   Start     Dose/Rate Route Frequency Ordered Stop   05/28/19 1000  cefTRIAXone (ROCEPHIN) 2 g in sodium chloride 0.9 % 100 mL IVPB     2 g 200 mL/hr over 30 Minutes Intravenous Every 24 hours 05/27/19 1510     05/27/19 0900  cefTRIAXone (ROCEPHIN) 2 g in sodium chloride 0.9 % 100 mL IVPB  Status:  Discontinued     2 g 200 mL/hr over 30 Minutes Intravenous Every 12 hours 05/27/19 0422 05/27/19 1510   05/27/19 0430  cefTRIAXone (ROCEPHIN) 2 g in sodium chloride 0.9 % 100 mL IVPB  Status:  Discontinued     2 g 200 mL/hr over 30 Minutes Intravenous Every 24 hours 05/27/19 0416 05/27/19 0417   05/27/19 0415  doxycycline (VIBRAMYCIN) 100 mg in sodium chloride 0.9 % 250 mL IVPB     100 mg 125 mL/hr over 120 Minutes Intravenous Every 12 hours 05/27/19 0416     05/26/19 1800  metroNIDAZOLE (FLAGYL) IVPB 500 mg  Status:   Discontinued     500 mg 100 mL/hr over 60 Minutes Intravenous Every 8 hours 05/26/19 1649 05/27/19 0417   05/26/19 1700  cefTRIAXone (ROCEPHIN) 2 g in sodium chloride 0.9 % 100 mL IVPB  Status:  Discontinued     2 g 200 mL/hr over 30 Minutes Intravenous Every 24 hours 05/26/19 1649 05/27/19 0422   05/26/19 1545  vancomycin (VANCOREADY) IVPB 2000 mg/400 mL     2,000 mg 200 mL/hr over 120 Minutes Intravenous  Once 05/26/19 1539 05/26/19 1933   05/26/19 1545  vancomycin (VANCOREADY) IVPB 500 mg/100 mL     500 mg 100 mL/hr over 60 Minutes Intravenous  Once 05/26/19 1539 05/26/19 2109    .  Subjective  The patient is awake and alert. She states that she still has severe pain in her right foot and leg.  Objective   Vitals:  Vitals:   05/28/19 1041 05/28/19 1234  BP: (!) 119/54 123/60  Pulse: 98 97  Resp: 20 17  Temp: 98.6 F (37 C) 98.1 F (36.7 C)  SpO2: 98% 100%   Exam:  Constitutional:  . The patient is awake, alert, and oriented x 3. No acute distress. Respiratory:  . No increased work of breathing. . No wheezes, rales, or rhonchi . No tactile fremitus Cardiovascular:  . Regular rate and rhythm .  No murmurs, ectopy, or gallups. . No lateral PMI. No thrills. Abdomen:  . Abdomen is soft, non-tender, non-distended . No hernias, masses, or organomegaly . Normoactive bowel sounds.  Musculoskeletal:  . No cyanosis, clubbing, or edema Skin:  . Severe erythema and swelling of right lower extremity . palpation of skin: no induration or nodules Neurologic:  . CN 2-12 intact . Sensation all 4 extremities intact Psychiatric:  . Mental status o Mood, affect appropriate o Orientation to person, place, time  . judgment and insight appear intact  I have personally reviewed the following:   Today's Data  . Vitals, BMP, lactic acid, CRP, CBC  Micro Data  . Blood cultures x 2 no growth  Scheduled Meds: . ammonium lactate  1 application Topical BID  . vitamin C  250 mg  Oral BID  . atorvastatin  40 mg Oral Daily  . carvedilol  3.125 mg Oral BID WC  . enoxaparin (LOVENOX) injection  40 mg Subcutaneous Q24H  . feeding supplement (ENSURE ENLIVE)  237 mL Oral BID  . feeding supplement (PRO-STAT SUGAR FREE 64)  30 mL Oral BID  . ferrous sulfate  325 mg Oral BID WC  . fluticasone  2 spray Each Nare Daily  . folic acid  1 mg Oral Daily  . furosemide  40 mg Intravenous Q12H  . insulin aspart  0-9 Units Subcutaneous TID WC  . lactulose  20 g Oral TID  . mirtazapine  30 mg Oral QHS  . multivitamin with minerals  1 tablet Oral Daily   Continuous Infusions: . albumin human Stopped (05/27/19 0353)  . cefTRIAXone (ROCEPHIN)  IV 2 g (05/28/19 1102)  . doxycycline (VIBRAMYCIN) IV 100 mg (05/28/19 0511)  .  sodium bicarbonate (isotonic) infusion in sterile water 50 mL/hr at 05/28/19 0217    Active Problems:   Sepsis ([place])   COPD (chronic obstructive pulmonary disease) (H[facility])   Acute on chronic diastolic CHF (congestive heart failure) ([facility])   Diabetes mellitus type 2, uncontrolled ([place])   Diabetic polyneuropathy associated with type 2 diabetes mellitus ([facility])   Impaired renal function   Obesity   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[facility])   Sleep apnea   Altered mental status   Anasarca   Cellulitis   Diabetic foot ulcer ([facility])   Decreased level of consciousness   Goals of care, counseling/discussion   Palliative care by specialist   LOS: 2 days   A & P  Decreased level of responsiveness: Patient is awake and alert. She is oriented x 3. No acute distress.  Sepsis with hypotension, elevated lactic acid Improving.  Hypotension: Due to sepsis. Improving.   Cellulitis right lower extremity: From upper thigh/groin to foot. Less warm to touch, less swollen. Pt will be started on vancomycin and rocephin IV. He has been transitioned to rocephin and doxycycline. Doppler of lower extremities negative for DVT.  Anasarca: Bilateral lower extremity edema. Pulmonary edema,  abdominal wall edema. Will diurese when blood pressure will tolerate it. Ability to diureses limited by the patient's elevated creatinine and borderline blood pressures.  Acute exacerbation of chronic Grade II diastolic dysfunction: Will diurese when blood pressure will tolerate it. Ability to diureses limited by the patient's elevated creatinine and borderline blood pressures.  COPD with restrictive cardiomyopathy: albuterol nebulizer treatments. Keep HOB at 30%.   CKD IIIB: Monitor creatinine, electrolytes, and volume status. Avoid nephrotoxic substances and hypotension.  DM II: Follow blood sugars with FSBS and SSI.   Metabolic acidosis: Due to elevated lactic acid.  Beta hydroxybutyric acid is negative.  Diabetic foot ulcer: IV vancomycin and rocephin.   OSA: CPAP for sleep  Diabetic polyneuropathy: Consider neurontin if patient is symptomatic in the am.  Morbid Obesity: BMI 40.57. Complicates all cares. Recommend sensible weight loss through diet and increased activity as guided by her PCP.  I have seen and examined this patient myself. I have spent 78 minutes in her evaluation and care.  DVT Prophylaxis: Heparin CODE STATUS: Full Code Family Communication: None available Disposition: The patient was from home. Anticipated disposition is unknown at this time. The patient will require evaluation by PT/OT to determine a safe discharge plan when she is capable.  Denver Harder, DO [facility] Direct contact: see [email or web address]  7PM-7AM contact night coverage as above 05/27/2019, 5:39 PM  LOS: 2 days

## 2019-05-28 NOTE — Consult Note (Signed)
Consultation Note Date: 05/28/2019   Patient Name: [name]  DOB: Jun 09, 1955  MRN: [id number]  Age / Sex: 64 y.o., female   PCP: [facility] Referring Physician: Benny Lennert, Ava, DO   REASON FOR CONSULTATION:Establishing goals of care  Palliative Care consult requested for goals of care discussion in this 64 y.o. female with multiple medical problems including diastolic congestive heart failure with anasarca, acute on chronic anemia, CKD IIIB, hepatitis C with cirrhosis, and chronic venous ulcers in the leg. She presented to ED from home with complaints of right foot pain and difficulty walking. During ED work-up BNP 827, lactic acid 5.2-->2.9, CRP 21.1. Chest x-ray showed cardiomegaly with mild interstitial pulmonary edema. Right foot x-ray showed no acute fracture or dislocation, diffuse soft tissue swelling. Left lower US showed diffuse edema, negative for DVT, enlarged bilateral inguinal lymphnodes.   Clinical Assessment and Goals of Care: I have reviewed medical records including lab results, imaging, Epic notes, and MAR, received report from the bedside RN, and assessed the patient. I met at the bedside with patient to discuss diagnosis prognosis, GOC, EOL wishes, disposition and options.  I introduced Palliative Medicine as specialized medical care for people living with serious illness. It focuses on providing relief from the symptoms and stress of a serious illness. The goal is to improve quality of life for both the patient and the family.  Patient was awake, alert and oriented x3. She complains of mild pain in right foot and leg with notations it is much better than it was previously.   We discussed a brief life review of the patient, along with her functional and nutritional status. Patient reports she lives in the home with her husband of 49 years. She is a retired Oceanographer. She has 1 daughter who lives in [place]. She is of [place] faith.    Prior to admission patient reports she has not ambulated far distances in over 2 years. She reports she is mainly wheelchair bound but is able to transfer herself out of the bed and use a walker to stand, turn, pivot, and walk a short distance to the bathroom. Which is about 50 ft. She reports having left great toe amputated and severe neuropathy and vascular problems to her lower extremity as the cause of her immobility. Patient reports she stays in her living room in a hospital bed. Her husband is also wheelchair bound and provides care that she needs. She is able to maneuver herself around her home and wash up at the sink or transfer over to her shower. She reports neighbors or her counselor (Mr. Lacinda Axon) arranges transportation for any appointments.   We discussed Her current illness and what it means in the larger context of Her on-going co-morbidities. Natural disease trajectory and expectations at EOL were discussed.  [name] understanding of her current illness and co-morbidities. She shares that she slipped on a rug when trying to get in her wheelchair and fell several days prior. Since then her foot has become extremely painful and leg swelling. She reports sitting at home and doing nothing for days but crying. Also endorsed incontinent episodes and poor appetite due to severity of pain and discomfort.   When asked about her daughter and the last time she saw her she reports she lives in [place] and does not come much. She reports her son is deceased. [name] reports her and her daughter a distant relationship.   I attempted to elicit  values and goals of care important to the patient.    Patient reports she is hopeful that the medical team will be able to get her feeling better an find out what is going on with her foot and leg. We discussed her overall care and co-morbidities. She verbalized understanding and expressed she feels her quality of life and home situation  is good. She shares her husband is a great cook and takes care of the both of them. She reports she is not interested in going to a facility during these COVID challenges.   Advanced directives, concepts specific to code status, artifical feeding and hydration, and rehospitalization were considered and discussed. Patient does not have a documented advanced directive. She reports her husband, Loisann Roach would be her medical decision maker.   We discussed at length her full code status with consideration of co-morbidities. [name] verbalized she would want all heroic measures, including artifical feedings if required. She reports "I want whatever there is to keep me alive, if those things don't work then it is in God's hand but I want to at least try whatever!" we discussed aggressive interventions and heroic measures including the need for long-term care in terms of worst case scenario and settings if they occurred. She verbalized understanding and again stated "it will be what it will be. You doctors just make sure you are trying!"    Hospice and Palliative Care services outpatient were explained and offered. Patient verbalized their understanding and awareness of both palliative and hospice's goals and philosophy of care. She is not interested in hospice of any discussions around their services at this time. Given expressed goals of full scope recommendations for outpatient palliative provided. She verbalized understanding and agreement.   Questions and concerns were addressed. The family was encouraged to call with questions or concerns.  PMT will continue to support holistically.   SOCIAL HISTORY:     reports that she has been smoking cigarettes. She has been smoking about 0.25 packs per day. She has never used smokeless tobacco. She reports that she does not drink alcohol or use drugs.  CODE STATUS: Full code  ADVANCE DIRECTIVES: Patient, [name] reports her husband Ladan Vanderzanden would be her decision maker if she is unable to make decisions for herself.    SYMPTOM MANAGEMENT: per attending   Palliative Prophylaxis:   Frequent Pain Assessment  PSYCHO-SOCIAL/SPIRITUAL:  Support System: Family  Desire for further Chaplaincy support: NO   Additional Recommendations (Limitations, Scope, Preferences):  Full Scope Treatment   PAST MEDICAL HISTORY: Past Medical History:  Diagnosis Date  . Cervical cancer ([place])   . CHF (congestive heart failure) ([place])   . Chronic anemia   . COPD (chronic obstructive pulmonary disease) ([place])   . Diabetes mellitus without complication ([place])   . Gastric ulcer   . NSTEMI (non-ST elevated myocardial infarction) ([place])   . Upper GI bleed     ALLERGIES:  is allergic to latex and gabapentin.   MEDICATIONS:  Current Facility-Administered Medications  Medication Dose Route Frequency Provider Last Rate Last Admin  . albumin human 25 % solution 25 g  25 g Intravenous Once Sharion Settler, NP   Stopped at 05/27/19 0353  . ammonium lactate (LAC-HYDRIN) 12 % lotion 1 application  1 application Topical BID Swayze, Ava, DO   1 application at 19/50/93 2233  . atorvastatin (LIPITOR) tablet 40 mg  40 mg Oral Daily Swayze, Ava, DO   40 mg at 05/27/19 1042  .  carvedilol (COREG) tablet 3.125 mg  3.125 mg Oral BID WC Swayze, Ava, DO      . cefTRIAXone (ROCEPHIN) 2 g in sodium chloride 0.9 % 100 mL IVPB  2 g Intravenous Q24H Berton Mount, RPH      . doxycycline (VIBRAMYCIN) 100 mg in sodium chloride 0.9 % 250 mL IVPB  100 mg Intravenous Q12H Sharion Settler, NP 125 mL/hr at 05/28/19 0511 100 mg at 05/28/19 0511  . enoxaparin (LOVENOX) injection 40 mg  40 mg Subcutaneous Q24H Swayze, Ava, DO   40 mg at 05/27/19 2021  . feeding supplement (ENSURE ENLIVE) (ENSURE ENLIVE) liquid   Oral BID Swayze, Ava, DO   237 mL at 05/27/19 2233  . feeding supplement (PRO-STAT SUGAR FREE 64) liquid 30 mL  30 mL Oral BID Swayze, Ava, DO   30 mL at  05/27/19 2233  . ferrous sulfate tablet 325 mg  325 mg Oral BID WC Swayze, Ava, DO      . fluticasone (FLONASE) 50 MCG/ACT nasal spray 2 spray  2 spray Each Nare Daily Swayze, Ava, DO      . folic acid (FOLVITE) tablet 1 mg  1 mg Oral Daily Swayze, Ava, DO   1 mg at 05/27/19 1043  . furosemide (LASIX) injection 40 mg  40 mg Intravenous Q12H Swayze, Ava, DO   40 mg at 05/28/19 0509  . insulin aspart (novoLOG) injection 0-9 Units  0-9 Units Subcutaneous TID WC Swayze, Ava, DO   2 Units at 05/28/19 0811  . lactulose (CHRONULAC) 10 GM/15ML solution 20 g  20 g Oral TID Swayze, Ava, DO   20 g at 05/27/19 2233  . mirtazapine (REMERON) tablet 30 mg  30 mg Oral QHS Swayze, Ava, DO   30 mg at 05/27/19 2233  . multivitamin with minerals tablet 1 tablet  1 tablet Oral Daily Swayze, Ava, DO   1 tablet at 05/27/19 1042  . oxyCODONE (Oxy IR/ROXICODONE) immediate release tablet 5 mg  5 mg Oral Q3H PRN Swayze, Ava, DO   5 mg at 05/27/19 2242  . senna-docusate (Senokot-S) tablet 1 tablet  1 tablet Oral QHS PRN Swayze, Ava, DO      . sodium bicarbonate 150 mEq in sterile water 1,000 mL infusion   Intravenous Continuous Sharion Settler, NP 50 mL/hr at 05/28/19 0217 New Bag at 05/28/19 0217  . sodium chloride 0.9 % bolus 1,000 mL  1,000 mL Intravenous Once Swayze, Ava, DO        VITAL SIGNS: BP (!) 109/52 (BP Location: Right Arm)   Pulse 90   Temp 98.4 F (36.9 C)   Resp 18   Ht 5' 5"  (1.651 m)   Wt 117.2 kg   SpO2 96%   BMI 42.98 kg/m  Filed Weights   05/26/19 1459 05/28/19 0442  Weight: 110.6 kg 117.2 kg    Estimated body mass index is 42.98 kg/m as calculated from the following:   Height as of this encounter: 5' 5"  (1.651 m).   Weight as of this encounter: 117.2 kg.  LABS: CBC:    Component Value Date/Time   WBC 8.9 05/27/2019 0530   HGB 7.3 (L) 05/27/2019 0530   HGB 7.3 (L) 11/23/2017 1454   HCT 25.5 (L) 05/27/2019 0530   HCT 45.7 08/03/2013 2138   PLT 152 05/27/2019 0530   PLT 118 (L)  08/03/2013 2138   Comprehensive Metabolic Panel:    Component Value Date/Time   NA 134 (L) 05/28/2019 6948  NA 139 08/03/2013 2138   K 4.6 05/28/2019 0607   K 3.4 (L) 08/03/2013 2138   CO2 21 (L) 05/28/2019 0607   CO2 27 08/03/2013 2138   BUN 45 (H) 05/28/2019 0607   BUN 16 08/03/2013 2138   CREATININE 2.28 (H) 05/28/2019 0607   CREATININE 1.19 08/03/2013 2138   ALBUMIN 3.0 (L) 05/04/2019 1132   ALBUMIN 3.7 03/12/2012 1104     Review of Systems Unless otherwise noted, a complete review of systems is negative.  Physical Exam General: NAD, chronically- ill appearing, obese Cardiovascular: regular rate and rhythm Pulmonary:diminished bilaterally  Abdomen: soft, nontender, + bowel sounds Extremities: right foot and leg to upper thigh edema and redness Neurological: A&O x3, mood appropriate    Prognosis: Guarded   Discharge Planning:  To Be Determined with outpatient Palliative  Recommendations:  Full Code/Full Scope-as expressed and confirmed by patient  Continue current plan of care per medical team  Patient remains hopeful for improvement. Does not wish to discuss anything other than improvement. Hopeful to return home with husband who is also wheelchair bound. States she is open to home health but not interested in SNF. Request all aggressive measures and interventions.   Outpatient Palliative support   PMT will continue to support and follow as needed.   Palliative Performance Scale: PPs 30%              Patient expressed understanding and was in agreement with this plan.   Thank you for allowing the Palliative Medicine Team to assist in the care of this patient.  Time In: 9233 Time Out: 1550 Time Total: 65 min.   Visit consisted of counseling and education dealing with the complex and emotionally intense issues of symptom management and palliative care in the setting of serious and potentially life-threatening illness.Greater than 50%  of this time was  spent counseling and coordinating care related to the above assessment and plan.  Signed by:  Alda Lea, AGPCNP-BC Palliative Medicine Team  Phone: [phone] Fax: [phone] Pager: [phone] Amion: Bjorn Pippin

## 2019-05-28 NOTE — Progress Notes (Signed)
Palliative: [name], [name], is sitting up quietly in bed.  She greets me making and somewhat keeping eye contact. She is alert and oriented, able to make her needs known.  There is no family at bedside at this time.   [name] tells me that she was an Agricultural consultant in [place], has been married to her husband [name] for 34 years.  She tells me that she is close to getting Medicare, but currently only has Medicaid.   We talk about some what if's and maybe's".  I mention her L great toe amputation.  I ask about further surgery if needed, amputations.  [name] tells me that she would not want further surgery, and I ask her to consider if this shortens her life.  I share that we would keep her comfortable, if she chooses.   We talk about code status.  I share the concept of "treat the treatable, but allow a natural death".  I ask if she does have life support, for how long?  We talk about the realities of CPR and life support.  I encourage her to consider choices.   Conference with attending, bedside nursing staff, and TOC related to patient condition, needs, [place] discussions.   Plan:   Continue to treat the treatable, considering code status.  Not qualified for rehab dt insurance coverage.    43 minutes Quinn Axe, NP Palliative Medicine Team Team Phone # [phone] Greater than 50% of this time was spent counseling and coordinating care related to the above assessment and plan.

## 2019-05-28 NOTE — Progress Notes (Signed)
Per Dr. Benny Lennert, patient can have order for vital signs per unit routine.

## 2019-05-28 NOTE — Progress Notes (Signed)
Initial Nutrition Assessment  DOCUMENTATION CODES:   Obesity unspecified  INTERVENTION:   Ensure Enlive po BID, each supplement provides 350 kcal and 20 grams of protein  Prostat liquid protein PO 30 ml BID with meals, each supplement provides 100 kcal, 15 grams protein.  MVI daily   Vitamin C 284m po BID  Liberalize diet   NUTRITION DIAGNOSIS:   Increased nutrient needs related to wound healing as evidenced by increased estimated needs.  GOAL:   Patient will meet greater than or equal to 90% of their needs  MONITOR:   PO intake, Supplement acceptance, Labs, Weight trends, Skin, I & O's  REASON FOR ASSESSMENT:   Consult Assessment of nutrition requirement/status  ASSESSMENT:   64y/o. female with h/o diastolic congestive heart failure with anasarca, acute on chronic anemia, CKD IIIB, hepatitis C with cirrhosis and chronic venous ulcers in the leg who is admitted with right foot pain and difficulty walking.   Met with pt in room today. Pt is well known to the nutrition department from multiple previous admits. Pt reports poor appetite and oral intake pta and today. Pt ate only sips and bites of her lunch today. Pt reports that she is drinking some chocolate Ensure. Pt is also ordered for Prostat but is refusing most of it. RD discussed with pt the importance of adequate nutrition needed to promote wound healing and preserve lean muscle. Pt reports that she will continue to drink the Ensure. RD will add vitamins to support wound healing. RD will also liberalize pt's diet as a heart healthy diet is restrictive of protein.   Per chart, pt is currently up ~60lbs from her UBW. It is difficult to determine if patient has had any recent significant weight loss.   Medications reviewed and include: lovenox, ferrous sulfate, folic acid, lasix, lactulose, remeron, MVI, doxycycline, Na bicarbonate, tramadol    Labs reviewed: BUN 45(H), creat 2.28(H) Hgb 7.3(L), Hct 25.5(L), MCV  75.7(L), MCH 21.7(L), MCHC 28.6(L) cbgs- 172, 195 x 24 hrs AIC 7.4(H)- 5/10  NUTRITION - FOCUSED PHYSICAL EXAM:    Most Recent Value  Orbital Region  No depletion  Upper Arm Region  No depletion  Thoracic and Lumbar Region  No depletion  Buccal Region  No depletion  Temple Region  No depletion  Clavicle Bone Region  No depletion  Clavicle and Acromion Bone Region  No depletion  Scapular Bone Region  No depletion  Dorsal Hand  No depletion  Patellar Region  No depletion  Anterior Thigh Region  No depletion  Posterior Calf Region  No depletion  Edema (RD Assessment)  Moderate  Hair  Reviewed  Eyes  Reviewed  Mouth  Reviewed  Skin  Reviewed  Nails  Reviewed     Diet Order:   Diet Order            Diet Carb Modified Fluid consistency: Thin; Room service appropriate? Yes  Diet effective now             EDUCATION NEEDS:   Education needs have been addressed  Skin:  Skin Assessment: Reviewed RN Assessment(Left anterior calf 5X6X.1cm, Right anterior leg 13X7cm, Right plantar foot 1X1X.1cm, Right plantar great toe .2X.2cm)  Last BM:  5/11- type 6  Height:   Ht Readings from Last 1 Encounters:  05/26/19 5' 5"  (1.651 m)    Weight:   Wt Readings from Last 1 Encounters:  05/28/19 117.2 kg    Ideal Body Weight:  56.8 kg  BMI:  Body  mass index is 42.98 kg/m.  Estimated Nutritional Needs:   Kcal:  1900-2200kcal/day  Protein:  95-105g/day  Fluid:  >1.7L/day  Koleen Distance MS, RD, LDN Please refer to [facility] for RD and/or RD on-call/weekend/after hours pager

## 2019-05-28 NOTE — Progress Notes (Signed)
[facility] Liaison RN note  This patient has a pending referral for community based palliative care to be followed by [facility].  Will follow for disposition  Please call with any questions or concerns.  Thank you. Margaretmary Eddy, BSN, RN [facility] Liaison [phone]

## 2019-05-28 NOTE — TOC Initial Note (Signed)
Transition of Care [facility]) - Initial/Assessment Note    Patient Details  Name: [name] MRN: [id number] Date of Birth: 1956/01/13  Transition of Care [facility]) CM/SW Contact:    Victorino Dike, RN Phone Number: 05/28/2019, 11:09 AM  Clinical Narrative:                  Met with patient, she reported that they have medications and groceries delivered to home.  She lives with husband who is wheelchair bound.  She reports being current with [facility] services, but couldn't tell me what services or agency.  I have left a message with Corene Cornea at [facility] to verify if patient is receiving services.  Patient avoidant with conversation and answering questions.      Barriers to Discharge: Continued Medical Work up   Patient Goals and CMS Choice Patient states their goals for this hospitalization and ongoing recovery are:: to return home with husband CMS Medicare.gov Compare Post Acute Care list provided to:: Patient Choice offered to / list presented to : Patient  Expected Discharge Plan and Services     Discharge Planning Services: CM Consult Post Acute Care Choice: [place] arrangements for the past 2 months: [place]: [place] ([place]) Date [place]: 05/28/19 Time [place]: 65 Representative spoke with at [place]: Corene Cornea, to verify if still a current patient  Prior Living Arrangements/Services Living arrangements for the past 2 months: Single Family Home Lives with:: Spouse Patient language and need for interpreter reviewed:: Yes Do you feel safe going back to the place where you live?: Yes      Need for Family Participation in Patient Care: Yes (Comment) Care giver support system in place?: Yes (comment)(husband) Current home services: DME, Home RN Criminal Activity/Legal Involvement Pertinent to Current Situation/Hospitalization: No - Comment as needed  Activities  of Daily Living Home Assistive Devices/Equipment: Wheelchair ADL Screening (condition at time of admission) Patient's cognitive ability adequate to safely complete daily activities?: Yes Is the patient deaf or have difficulty hearing?: No Does the patient have difficulty seeing, even when wearing glasses/contacts?: No Does the patient have difficulty concentrating, remembering, or making decisions?: No Patient able to express need for assistance with ADLs?: Yes Does the patient have difficulty dressing or bathing?: Yes Independently performs ADLs?: No Dressing (OT): Needs assistance Does the patient have difficulty walking or climbing stairs?: Yes Weakness of Legs: Both Weakness of Arms/Hands: None  Permission Sought/Granted                  Emotional Assessment Appearance:: Appears older than stated age Attitude/Demeanor/Rapport: Avoidant Affect (typically observed): Depressed, Flat Orientation: : Oriented to Self, Oriented to Place, Oriented to  Time, Oriented to Situation Alcohol / Substance Use: Not Applicable Psych Involvement: No (comment)  Admission diagnosis:  Fatigue [R53.83] Decreased level of consciousness [R41.89] Sepsis ([place]) [A41.9] Swelling of lower extremity [M79.89] Hypervolemia, unspecified hypervolemia type [E87.70] Ankle edema, bilateral [M25.471, M25.472] Patient Active Problem List   Diagnosis Date Noted  . Anasarca 05/26/2019  . Cellulitis 05/26/2019  . Diabetic foot ulcer ([place]) 05/26/2019  . Decreased level of consciousness 05/26/2019  . Moderate mitral regurgitation by prior echocardiogram 05/04/2019  . Moderate - severe tricuspid regurgitation by prior echocardiogram 05/04/2019  . CKD (chronic kidney disease) stage 3,  GFR 30-59 ml/min 05/04/2019  . HCV antibody positive 05/04/2019  . Other cirrhosis of liver ([place]) 05/04/2019  . Fall 02/24/2019  . UTI (urinary tract infection) 02/23/2019  . AVM (arteriovenous malformation) of small bowel,  acquired with hemorrhage   . Epistaxis 11/29/2018  . Altered mental status   . Heme positive stool   . Gastritis and gastroduodenitis   . Encephalopathy acute   . Acute on chronic blood loss anemia 11/20/2018  . Pressure injury of skin 10/26/2018  . Anxiety 08/15/2018  . Chronic back pain 08/15/2018  . GERD (gastroesophageal reflux disease) 08/15/2018  . History of carpal tunnel syndrome 08/15/2018  . HNP (herniated nucleus pulposus), lumbar 08/15/2018  . Hyperlipidemia 08/15/2018  . Hypertension 08/15/2018  . Insomnia 08/15/2018  . Left ventricular hypertrophy 08/15/2018  . Obesity 08/15/2018  . Sleep apnea 08/15/2018  . Severe anemia 06/20/2018  . Diabetic polyneuropathy associated with type 2 diabetes mellitus ([place]) 02/23/2018  . Moderate tricuspid regurgitation 02/18/2018  . Acute on chronic diastolic CHF (congestive heart failure) ([place]) 02/08/2018  . 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 Depression 02/05/2018  . Generalized abdominal pain 02/05/2018  . CHF (congestive heart failure) ([place]) 01/13/2018  . B12 deficiency 12/07/2017  . Iron deficiency anemia due to chronic blood loss 12/07/2017  . Folate deficiency 12/07/2017  . Upper GI bleed 11/03/2017  . COPD (chronic obstructive pulmonary disease) ([place]) 09/26/2017  . Symptomatic anemia 09/26/2017  . Anemia   . Weakness   . Lower GI bleeding 08/07/2017  . Chronic systolic heart failure ([place]) 12/28/2016  . Tobacco use 12/28/2016  . NSTEMI (non-ST elevated myocardial infarction) ([place])   . Sepsis ([place]) 12/18/2016  . CHF exacerbation ([place]) 12/04/2015  . PNA (pneumonia) 10/28/2015  . Chronic hepatitis C without hepatic coma ([place]) 05/21/2014  . CAD (coronary artery disease) 07/19/2013  . Right arm pain 06/26/2013  . Cervical pain (neck) 03/28/2013  . Radiculopathy affecting upper extremity 03/28/2013  . Impaired renal function 12/21/2012  . Abnormal mammogram 12/20/2012  . Diabetes mellitus type 2, uncontrolled  ([place]) 12/20/2012   PCP:  [facility]:   [place], [place], [address] [place] [address] Phone: [phone] Fax: [place], [place] - [address] [place] [address] Phone: [phone] Fax: [phone]  [id number] [id number]: [phone]: [phone]    Social Determinants of Health (SDOH) Interventions    Readmission Risk Interventions Readmission Risk Prevention Plan 05/28/2019 02/26/2019 12/17/2018  Transportation Screening Complete Complete Complete  PCP or Specialist Appt within 5-7 Days - - -  Home Care Screening - - -  Medication Review (RN CM) - - -  HRI or H[place]Work Consult for [place]anning/Counseling - - -  SW consult not completed comments - - -  Palliative Care Screening - - -  Medication Review (Press photographer Complete Complete Referral to Pharmacy  PCP or Specialist appointment within 3-5 days of discharge - (No Data) Not Complete  PCP/Specialist Appt Not Complete comments - - DC date unknown but pt is established with providers  [place] Home Care Consult Complete Complete Not Complete  HRI or Home Care Consult Pt Refusal Comments - - SNF resident  SW Recovery Care/Counseling  Consult Complete - Complete  Palliative Care Screening Not Applicable Not Applicable Not Complete  Comments - - pending need  [place] Complete Patient Refused Complete  Some recent data might be hidden

## 2019-05-28 NOTE — Plan of Care (Signed)
Patient remains in house for management of lower extremity swelling and pain secondary to Anasarca. There have been no acute events over night. The patient has been alert only to self. She has been drowsy throughout much of the shift; however, she is easily aroused. She's been hypotensive. She received a liter fluid bolus. Blood pressure reassessed and hospitalist notified. No new interventions. Other vitals stable. Continuous IV fluids infusing as ordered. IV antibiotic administered as ordered. Blood glucose monitored. Compliant with medication therapies. She received one dose of prn oxycodone for right foot pain. Medication effective per patient. She remains on telemetry. Dressing to right lower extremity changed per WOC recommendations. Safety precautions in place. Bed alarm activated. Patient free of falls and injuries. Plan of care continued.    Problem: Education: Goal: Knowledge of General Education information will improve Description: Including pain rating scale, medication(s)/side effects and non-pharmacologic comfort measures Outcome: Not Progressing   Problem: Health Behavior/Discharge Planning: Goal: Ability to manage health-related needs will improve Outcome: Not Progressing   Problem: Clinical Measurements: Goal: Ability to maintain clinical measurements within normal limits will improve Outcome: Not Progressing Goal: Will remain free from infection Outcome: Not Progressing Goal: Diagnostic test results will improve Outcome: Not Progressing Goal: Respiratory complications will improve Outcome: Not Progressing Goal: Cardiovascular complication will be avoided Outcome: Not Progressing   Problem: Activity: Goal: Risk for activity intolerance will decrease Outcome: Not Progressing   Problem: Nutrition: Goal: Adequate nutrition will be maintained Outcome: Not Progressing   Problem: Coping: Goal: Level of anxiety will decrease Outcome: Not Progressing   Problem:  Elimination: Goal: Will not experience complications related to bowel motility Outcome: Not Progressing Goal: Will not experience complications related to urinary retention Outcome: Not Progressing   Problem: Pain Managment: Goal: General experience of comfort will improve Outcome: Not Progressing   Problem: Safety: Goal: Ability to remain free from injury will improve Outcome: Not Progressing   Problem: Skin Integrity: Goal: Risk for impaired skin integrity will decrease Outcome: Not Progressing   Problem: Education: Goal: Ability to demonstrate management of disease process will improve Outcome: Not Progressing Goal: Ability to verbalize understanding of medication therapies will improve Outcome: Not Progressing Goal: Individualized Educational Video(s) Outcome: Not Progressing   Problem: Activity: Goal: Capacity to carry out activities will improve Outcome: Not Progressing   Problem: Cardiac: Goal: Ability to achieve and maintain adequate cardiopulmonary perfusion will improve Outcome: Not Progressing   Problem: Education: Goal: Ability to describe self-care measures that may prevent or decrease complications (Diabetes Survival Skills Education) will improve Outcome: Not Progressing Goal: Individualized Educational Video(s) Outcome: Not Progressing   Problem: Coping: Goal: Ability to adjust to condition or change in health will improve Outcome: Not Progressing   Problem: Fluid Volume: Goal: Ability to maintain a balanced intake and output will improve Outcome: Not Progressing   Problem: Health Behavior/Discharge Planning: Goal: Ability to identify and utilize available resources and services will improve Outcome: Not Progressing Goal: Ability to manage health-related needs will improve Outcome: Not Progressing   Problem: Metabolic: Goal: Ability to maintain appropriate glucose levels will improve Outcome: Not Progressing   Problem: Nutritional: Goal:  Maintenance of adequate nutrition will improve Outcome: Not Progressing Goal: Progress toward achieving an optimal weight will improve Outcome: Not Progressing   Problem: Skin Integrity: Goal: Risk for impaired skin integrity will decrease Outcome: Not Progressing   Problem: Tissue Perfusion: Goal: Adequacy of tissue perfusion will improve Outcome: Not Progressing

## 2019-05-28 NOTE — Progress Notes (Signed)
PROGRESS NOTE  [name] HYI:[id number] DOB: 05/26/1955 DOA: 05/26/2019 PCP: [facility]  Brief History    The patient is a morbidly obese, chronically ill appearing 64 yr old woman. She presents to [facility] today with above chief complaints. She was discharged from this facility on 05/10/2019 after a stay for acute on chronic diastolic congestive heart failure with anasarca, acute on chronic anemia, CKD IIIB, hepatitis C with cirrhosis, and chronic venous ulcers in the leg.  All HPI is per ED documentation as the patient is chemically obtunded and unable to participate with history or exam.  Per the ED staff the patient has had difficulty with walking in the past 3 days. She has had significant pain in her right foot. She has some chronic shortness of breath.  In the ED she had pain with movement of the foot. She was afebrilel, HR in the 70's and RR in the low twenties for the most part. Oxygen saturations were in the upper nineties.  Consultants  . Palliative care . Wound Care  Procedures  . None  Antibiotics   Anti-infectives (From admission, onward)   Start     Dose/Rate Route Frequency Ordered Stop   05/28/19 1000  cefTRIAXone (ROCEPHIN) 2 g in sodium chloride 0.9 % 100 mL IVPB     2 g 200 mL/hr over 30 Minutes Intravenous Every 24 hours 05/27/19 1510     05/27/19 0900  cefTRIAXone (ROCEPHIN) 2 g in sodium chloride 0.9 % 100 mL IVPB  Status:  Discontinued     2 g 200 mL/hr over 30 Minutes Intravenous Every 12 hours 05/27/19 0422 05/27/19 1510   05/27/19 0430  cefTRIAXone (ROCEPHIN) 2 g in sodium chloride 0.9 % 100 mL IVPB  Status:  Discontinued     2 g 200 mL/hr over 30 Minutes Intravenous Every 24 hours 05/27/19 0416 05/27/19 0417   05/27/19 0415  doxycycline (VIBRAMYCIN) 100 mg in sodium chloride 0.9 % 250 mL IVPB     100 mg 125 mL/hr over 120 Minutes Intravenous Every 12 hours 05/27/19 0416     05/26/19 1800  metroNIDAZOLE (FLAGYL) IVPB 500 mg  Status:   Discontinued     500 mg 100 mL/hr over 60 Minutes Intravenous Every 8 hours 05/26/19 1649 05/27/19 0417   05/26/19 1700  cefTRIAXone (ROCEPHIN) 2 g in sodium chloride 0.9 % 100 mL IVPB  Status:  Discontinued     2 g 200 mL/hr over 30 Minutes Intravenous Every 24 hours 05/26/19 1649 05/27/19 0422   05/26/19 1545  vancomycin (VANCOREADY) IVPB 2000 mg/400 mL     2,000 mg 200 mL/hr over 120 Minutes Intravenous  Once 05/26/19 1539 05/26/19 1933   05/26/19 1545  vancomycin (VANCOREADY) IVPB 500 mg/100 mL     500 mg 100 mL/hr over 60 Minutes Intravenous  Once 05/26/19 1539 05/26/19 2109     Subjective  The patient is somnolent this morning.  No new complaints.  Objective   Vitals:  Vitals:   05/28/19 1041 05/28/19 1234  BP: (!) 119/54 123/60  Pulse: 98 97  Resp: 20 17  Temp: 98.6 F (37 C) 98.1 F (36.7 C)  SpO2: 98% 100%   Exam:  Constitutional:  . The patient is somnolent, but easy to awaken. No acute distress. Respiratory:  . No increased work of breathing. . No wheezes, rales, or rhonchi . No tactile fremitus Cardiovascular:  . Regular rate and rhythm . No murmurs, ectopy, or gallups. . No lateral PMI. No thrills.  Abdomen:  . Abdomen is soft, non-tender, non-distended . No hernias, masses, or organomegaly . Normoactive bowel sounds.  Musculoskeletal:  . No cyanosis or clubbing . Decreased edema bilaterally. Skin:  . Clearing of erythema and swelling of right lower extremity . palpation of skin: no induration or nodules Neurologic:  . CN 2-12 intact . Sensation all 4 extremities intact Psychiatric:  . Mental status o Mood, affect appropriate o Orientation to person, place, time  . judgment and insight appear intact  I have personally reviewed the following:   Today's Data  . Vitals, BMP, lactic acid  Micro Data  . Blood cultures x 2 from 05/26/2019 positive for strep. . Blood cultures x 2 from 05/27/2019 have had no growth  Scheduled Meds: . ammonium  lactate  1 application Topical BID  . vitamin C  250 mg Oral BID  . atorvastatin  40 mg Oral Daily  . carvedilol  3.125 mg Oral BID WC  . enoxaparin (LOVENOX) injection  40 mg Subcutaneous Q24H  . feeding supplement (ENSURE ENLIVE)  237 mL Oral BID  . feeding supplement (PRO-STAT SUGAR FREE 64)  30 mL Oral BID  . ferrous sulfate  325 mg Oral BID WC  . fluticasone  2 spray Each Nare Daily  . folic acid  1 mg Oral Daily  . furosemide  40 mg Intravenous Q12H  . insulin aspart  0-9 Units Subcutaneous TID WC  . lactulose  20 g Oral TID  . mirtazapine  30 mg Oral QHS  . multivitamin with minerals  1 tablet Oral Daily   Continuous Infusions: . albumin human Stopped (05/27/19 0353)  . cefTRIAXone (ROCEPHIN)  IV 2 g (05/28/19 1102)  . doxycycline (VIBRAMYCIN) IV 100 mg (05/28/19 0511)  .  sodium bicarbonate (isotonic) infusion in sterile water 50 mL/hr at 05/28/19 0217    Active Problems:   Sepsis ([place])   COPD (chronic obstructive pulmonary disease) (HCC)   Acute on chronic diastolic CHF (congestive heart failure) (HCC)   Diabetes mellitus type 2, uncontrolled ([place])   Diabetic polyneuropathy associated with type 2 diabetes mellitus (HCC)   Impaired renal function   Obesity   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HCC)   Sleep apnea   Altered mental status   Anasarca   Cellulitis   Diabetic foot ulcer (HCC)   Decreased level of consciousness   Goals of care, counseling/discussion   Palliative care by specialist   LOS: 2 days   A & P  Decreased level of responsiveness: Patient is awake and alert. She is oriented x 3. No acute distress.  Sepsis with hypotension, elevated lactic acid improving.  Strep bacteremia: Pt is on IV rocephin, sensitivities pending. Repeat cultures from 05/27/2019 have had no growth. Monitor. Echocardiogram pending.  Hypotension: resolved.   Cellulitis right lower extremity: From upper thigh/groin to foot. Less warm to touch, less swollen. Pt is on IV rocephin  and doxycycline. Doppler of lower extremities negative for DVT.  Anasarca: Bilateral lower extremity edema. Pulmonary edema, abdominal wall edema. Will start diuresis as blood pressures are improved.  Acute exacerbation of chronic Grade II diastolic dysfunction: Will diurese when blood pressure will tolerate it. Will start diuresis as blood pressures are improved.  COPD with restrictive cardiomyopathy: albuterol nebulizer treatments. Keep HOB at 30%.   CKD IIIB: Monitor creatinine, electrolytes, and volume status. Avoid nephrotoxic substances and hypotension.  DM II: Follow blood sugars with FSBS and SSI.   Metabolic acidosis: Due to elevated lactic acid. Beta hydroxybutyric acid is negative.  Diabetic foot ulcer:Wound care.   OSA: CPAP for sleep  Diabetic polyneuropathy: Consider neurontin if patient is symptomatic in the am.  Morbid Obesity: BMI 40.57. Complicates all cares. Recommend sensible weight loss through diet and increased activity as guided by her PCP.  I have seen and examined this patient myself. I have spent 34 minutes in her evaluation and care.  DVT Prophylaxis: Heparin CODE STATUS: Full Code Family Communication: None available Disposition: The patient was from home. Anticipated disposition is unknown at this time. The patient will require evaluation by PT/OT to determine a safe discharge plan when she is capable.  Hollyanne Schloesser, DO [facility] Direct contact: see [email or web address]  7PM-7AM contact night coverage as above 05/28/2019, 4:04 PM  LOS: 2 days

## 2019-05-29 DIAGNOSIS — G9341 Metabolic encephalopathy: Secondary | ICD-10-CM

## 2019-05-29 DIAGNOSIS — E1122 Type 2 diabetes mellitus with diabetic chronic kidney disease: Secondary | ICD-10-CM

## 2019-05-29 DIAGNOSIS — B955 Unspecified streptococcus as the cause of diseases classified elsewhere: Secondary | ICD-10-CM

## 2019-05-29 DIAGNOSIS — L89210 Pressure ulcer of right hip, unstageable: Secondary | ICD-10-CM

## 2019-05-29 DIAGNOSIS — E785 Hyperlipidemia, unspecified: Secondary | ICD-10-CM

## 2019-05-29 DIAGNOSIS — I959 Hypotension, unspecified: Secondary | ICD-10-CM

## 2019-05-29 DIAGNOSIS — N179 Acute kidney failure, unspecified: Secondary | ICD-10-CM

## 2019-05-29 DIAGNOSIS — R6520 Severe sepsis without septic shock: Secondary | ICD-10-CM

## 2019-05-29 DIAGNOSIS — R7881 Bacteremia: Secondary | ICD-10-CM

## 2019-05-29 DIAGNOSIS — N183 Chronic kidney disease, stage 3 unspecified: Secondary | ICD-10-CM

## 2019-05-29 DIAGNOSIS — N189 Chronic kidney disease, unspecified: Secondary | ICD-10-CM

## 2019-05-29 DIAGNOSIS — A408 Other streptococcal sepsis: Principal | ICD-10-CM

## 2019-05-29 LAB — CULTURE, BLOOD (ROUTINE X 2): Special Requests: ADEQUATE

## 2019-05-29 LAB — BASIC METABOLIC PANEL
Anion gap: 10 (ref 5–15)
BUN: 46 mg/dL — ABNORMAL HIGH (ref 8–23)
CO2: 22 mmol/L (ref 22–32)
Calcium: 8 mg/dL — ABNORMAL LOW (ref 8.9–10.3)
Chloride: 103 mmol/L (ref 98–111)
Creatinine, Ser: 2.39 mg/dL — ABNORMAL HIGH (ref 0.44–1.00)
GFR calc Af Amer: 24 mL/min — ABNORMAL LOW (ref >60)
GFR calc non Af Amer: 21 mL/min — ABNORMAL LOW (ref >60)
Glucose, Bld: 132 mg/dL — ABNORMAL HIGH (ref 70–99)
Potassium: 4.7 mmol/L (ref 3.5–5.1)
Sodium: 135 mmol/L (ref 135–145)

## 2019-05-29 LAB — GLUCOSE, CAPILLARY
Glucose-Capillary: 116 mg/dL — ABNORMAL HIGH (ref 70–99)
Glucose-Capillary: 151 mg/dL — ABNORMAL HIGH (ref 70–99)
Glucose-Capillary: 152 mg/dL — ABNORMAL HIGH (ref 70–99)
Glucose-Capillary: 157 mg/dL — ABNORMAL HIGH (ref 70–99)

## 2019-05-29 MED ORDER — SODIUM CHLORIDE 0.9 % IV SOLN: INTRAVENOUS | Status: DC

## 2019-05-29 MED ORDER — DOXYCYCLINE HYCLATE 100 MG PO TABS: 100.0000 mg | ORAL_TABLET | Freq: Two times a day (BID) | ORAL | Status: DC

## 2019-05-29 MED ORDER — ENOXAPARIN SODIUM 40 MG/0.4ML ~~LOC~~ SOLN
40.0000 mg | SUBCUTANEOUS | Status: DC
  Administered 2019-05-29: 40 mg via SUBCUTANEOUS
  Filled 2019-05-29: qty 0.4

## 2019-05-29 MED ORDER — ENOXAPARIN SODIUM 40 MG/0.4ML ~~LOC~~ SOLN: 40.0000 mg | Freq: Two times a day (BID) | SUBCUTANEOUS | Status: DC

## 2019-05-29 MED ORDER — VITAMIN K1 10 MG/ML IJ SOLN
5.0000 mg | Freq: Once | INTRAVENOUS | Status: AC
  Administered 2019-05-29: 5 mg via INTRAVENOUS
  Filled 2019-05-29: qty 0.5

## 2019-05-29 MED ORDER — PENICILLIN G POTASSIUM 20000000 UNITS IJ SOLR
9.0000 10*6.[IU] | Freq: Two times a day (BID) | INTRAVENOUS | Status: DC
  Administered 2019-05-29 – 2019-05-31 (×4): 9 10*6.[IU] via INTRAVENOUS
  Filled 2019-05-29 (×5): qty 9

## 2019-05-29 MED ORDER — SODIUM CHLORIDE 0.9 % IV BOLUS
250.0000 mL | Freq: Once | INTRAVENOUS | Status: AC
  Administered 2019-05-29: 250 mL via INTRAVENOUS

## 2019-05-29 NOTE — Consult Note (Signed)
Cardiology Consultation Note    Patient ID: [name], MRN: [id number], DOB/AGE: [birth year] 64 y.o. Admit date: 05/26/2019   Date of Consult: 05/29/2019 Primary Physician: [place] Primary Cardiologist:    Chief Complaint: bacteremia Reason for Consultation: tee Requesting MD: Dr. Leslye Peer  HPI: [name] is a 64 y.o. female with history of congestive heart failure, COPD, diabetes, coronary disease and upper GI bleed admitted with weakness fatigue and noted to have bacteremia.  Patient has an EF of 40 to 45% with three-vessel disease.  She underwent coronary bypass grafting emergently seen in 2019.  She has sleep apnea and is on BiPAP.  She has a bioprosthetic aortic valve.  She was admitted with bacteremia.  Transesophageal echo requested to evaluate for endocarditis.  Past Medical History:  Diagnosis Date  . Cervical cancer ([place])   . CHF (congestive heart failure) ([place])   . Chronic anemia   . COPD (chronic obstructive pulmonary disease) ([place])   . Diabetes mellitus without complication ([place])   . Gastric ulcer   . NSTEMI (non-ST elevated myocardial infarction) ([place])   . Upper GI bleed       Surgical History:  Past Surgical History:  Procedure Laterality Date  . AMPUTATION Left 06/24/2018   Procedure: AMPUTATION GREAT TOE;  Surgeon: Samara Deist, DPM;  Location: [facility];  Service: Podiatry;  Laterality: Left;  . APPENDECTOMY    . BIOPSY  11/22/2018   Procedure: BIOPSY;  Surgeon: Thornton Park, MD;  Location: [place];  Service: Gastroenterology;;  . CARDIAC CATHETERIZATION    . CHOLECYSTECTOMY    . COLONOSCOPY N/A 09/28/2017   Procedure: COLONOSCOPY;  Surgeon: Toledo, Benay Pike, MD;  Location: [facility] ENDOSCOPY;  Service: Gastroenterology;  Laterality: N/A;  . COLONOSCOPY WITH PROPOFOL N/A 08/21/2018   Procedure: COLONOSCOPY WITH PROPOFOL;  Surgeon: Lin Landsman, MD;  Location: [facility] ENDOSCOPY;  Service: Gastroenterology;  Laterality: N/A;  .  COLONOSCOPY WITH PROPOFOL N/A 12/28/2018   Procedure: COLONOSCOPY WITH PROPOFOL;  Surgeon: Rush Landmark Telford Nab., MD;  Location: [facility]COPY;  Service: Gastroenterology;  Laterality: N/A;  . ENTEROSCOPY Left 11/06/2017   Procedure: ENTEROSCOPY;  Surgeon: Jonathon Bellows, MD;  Location: [facility] ENDOSCOPY;  Service: Gastroenterology;  Laterality: Left;  . ENTEROSCOPY N/A 06/22/2018   Procedure: ENTEROSCOPY;  Surgeon: Lin Landsman, MD;  Location: [facility] ENDOSCOPY;  Service: Gastroenterology;  Laterality: N/A;  . ENTEROSCOPY N/A 08/18/2018   Procedure: ENTEROSCOPY;  Surgeon: Jonathon Bellows, MD;  Location: [facility] ENDOSCOPY;  Service: Gastroenterology;  Laterality: N/A;  . ENTEROSCOPY N/A 11/01/2018   Procedure: ENTEROSCOPY;  Surgeon: Virgel Manifold, MD;  Location: [facility] ENDOSCOPY;  Service: Endoscopy;  Laterality: N/A;  . ENTEROSCOPY N/A 11/22/2018   Procedure: ENTEROSCOPY;  Surgeon: Thornton Park, MD;  Location: [place];  Service: Gastroenterology;  Laterality: N/A;  . ENTEROSCOPY Left 12/17/2018   Procedure: ENTEROSCOPY;  Surgeon: Yetta Flock, MD;  Location: [facility]OS[facility];  Service: Gastroenterology;  Laterality: Left;  . ENTEROSCOPY N/A 12/19/2018   Procedure: ENTEROSCOPY;  Surgeon: Yetta Flock, MD;  Location: [facility];  Service: Gastroenterology;  Laterality: N/A;  . ENTEROSCOPY N/A 12/20/2018   Procedure: ENTEROSCOPY;  Surgeon: Lavena Bullion, DO;  Location: [facility];  Service: Gastroenterology;  Laterality: N/A;  . ESOPHAGOGASTRODUODENOSCOPY N/A 12/19/2016   Procedure: ESOPHAGOGASTRODUODENOSCOPY (EGD);  Surgeon: Lin Landsman, MD;  Location: [facility] ENDOSCOPY;  Service: Gastroenterology;  Laterality: N/A;  . ESOPHAGOGASTRODUODENOSCOPY N/A 09/28/2017   Procedure: ESOPHAGOGASTRODUODENOSCOPY (EGD);  Surgeon: Alice Reichert, Benay Pike, MD;  Location:  [facility] ENDOSCOPY;  Service: Gastroenterology;  Laterality: N/A;  . ESOPHAGOGASTRODUODENOSCOPY (EGD) WITH PROPOFOL N/A  08/08/2017   Procedure: ESOPHAGOGASTRODUODENOSCOPY (EGD) WITH PROPOFOL;  Surgeon: [name], MD;  Location: [facility] ENDOSCOPY;  Service: Endoscopy;  Laterality: N/A;  . GIVENS CAPSULE STUDY N/A 10/28/2018   Procedure: GIVENS CAPSULE STUDY;  Surgeon: Jonathon Bellows, MD;  Location: [facility] ENDOSCOPY;  Service: Gastroenterology;  Laterality: N/A;  . GIVENS CAPSULE STUDY N/A 12/26/2018   Procedure: GIVENS CAPSULE STUDY;  Surgeon: Rush Landmark Telford Nab., MD;  [facility];  Service: Gastroenterology;  Laterality: N/A;  . [place]    . HEMOSTASIS CLIP PLACEMENT  12/19/2018   Procedure: HEMOSTASIS CLIP PLACEMENT;  Surgeon: Yetta Flock, MD;  Location: [facility];  Service: Gastroenterology;;  . HEMOSTASIS CLIP PLACEMENT  12/28/2018   Procedure: HEMOSTASIS CLIP PLACEMENT;  Surgeon: Irving Copas., MD;  Location: [facility];  Service: Gastroenterology;;  . HOT HEMOSTASIS N/A 12/17/2018   Procedure: HOT HEMOSTASIS (ARGON PLASMA COAGULATION/BICAP);  Surgeon: Yetta Flock, MD;  Location: [facility];  Service: Gastroenterology;  Laterality: N/A;  . HOT HEMOSTASIS N/A 12/28/2018   Procedure: HOT HEMOSTASIS (ARGON PLASMA COAGULATION/BICAP);  Surgeon: Irving Copas., MD;  Location: [facility];  Service: Gastroenterology;  Laterality: N/A;  . LEFT HEART CATH AND CORONARY ANGIOGRAPHY N/A 12/23/2016   Procedure: LEFT HEART CATH AND CORONARY ANGIOGRAPHY;  Surgeon: Teodoro Spray, MD;  Location: [place] CV LAB;  Service: Cardiovascular;  Laterality: N/A;  . LOWER EXTREMITY ANGIOGRAPHY Left 06/26/2018   Procedure: Lower Extremity Angiography;  Surgeon: Katha Cabal, MD;  Location: [place] CV [facility];  Service: Cardiovascular;  Laterality: Left;  . SUBMUCOSAL INJECTION  12/19/2018   Procedure: SUBMUCOSAL INJECTION;  Surgeon: Yetta Flock, MD;  Location: [facility];  Service: Gastroenterology;;  . Lia Foyer TATTOO INJECTION   12/17/2018   Procedure: SUBMUCOSAL TATTOO INJECTION;  Surgeon: Yetta Flock, MD;  [facility];  Service: Gastroenterology;;  . Lia Foyer TATTOO INJECTION  12/28/2018   Procedure: SUBMUCOSAL TATTOO INJECTION;  Surgeon: Irving Copas., MD;  Location: [facility];  Service: Gastroenterology;;  . TEE WITHOUT CARDIOVERSION N/A 10/29/2018   Procedure: TRANSESOPHAGEAL ECHOCARDIOGRAM (TEE);  Surgeon: Teodoro Spray, MD;  Location: [facility] ORS;  Service: Cardiovascular;  Laterality: N/A;     Home Meds: Prior to Admission medications   Medication Sig Start Date End Date Taking? Authorizing Provider  Amino Acids-Protein Hydrolys (FEEDING SUPPLEMENT, PRO-STAT SUGAR FREE 64,) LIQD Take 30 mLs by mouth 2 (two) times daily. 12/07/18  Yes Al Decant, MD  ammonium lactate (AMLACTIN) 12 % cream Apply 1 g topically 2 (two) times daily.   Yes [provider]  atorvastatin (LIPITOR) 40 MG tablet Take 40 mg by mouth daily.   Yes [provider]  carvedilol (COREG) 3.125 MG tablet Take 1 tablet (3.125 mg total) by mouth 2 (two) times daily with a meal. 08/21/18  Yes Ojie, Jude, MD  collagenase (SANTYL) ointment Apply topically daily. 05/10/19  Yes Fritzi Mandes, MD  ferrous sulfate 325 (65 FE) MG tablet Take 1 tablet (325 mg total) by mouth 2 (two) times daily with a meal. 05/10/19  Yes Fritzi Mandes, MD  fluticasone (FLONASE) 50 MCG/ACT nasal spray Place 2 sprays into both nostrils daily.   Yes [provider]  folic acid (FOLVITE) 1 MG tablet Take 1 tablet (1 mg total) by mouth daily. 08/21/18  Yes Ojie, Jude, MD  lactulose (CHRONULAC) 10 GM/15ML solution Take 30 mLs (20 g total)  by mouth 3 (three) times daily. 12/07/18  Yes Al Decant, MD  mirtazapine (REMERON) 30 MG tablet Take 30 mg by mouth at bedtime. 03/29/19  Yes [provider]  Multiple Vitamin (MULTIVITAMIN WITH MINERALS) TABS tablet Take 1 tablet by mouth daily. 06/28/18  Yes Lang Snow, NP  Nutritional Supplements (RESOURCE 2.0 PO) Take 1 Bottle by mouth 2 (two) times daily.   Yes [provider]  senna-docusate (SENOKOT-S) 8.6-50 MG tablet Take 1 tablet by mouth at bedtime as needed for mild constipation. 01/01/19  Yes Sheth, Devam P, MD  spironolactone (ALDACTONE) 25 MG tablet Take 12.5 mg by mouth daily.   Yes [provider]  torsemide (DEMADEX) 20 MG tablet Take 1 tablet (20 mg total) by mouth daily. 05/10/19  Yes Fritzi Mandes, MD  pregabalin (LYRICA) 150 MG capsule Take 150 mg by mouth 2 (two) times daily. 05/23/19   [provider]    Inpatient Medications:  . ammonium lactate  1 application Topical BID  . vitamin C  250 mg Oral BID  . atorvastatin  40 mg Oral Daily  . enoxaparin (LOVENOX) injection  40 mg Subcutaneous Q24H  . feeding supplement (ENSURE ENLIVE)  237 mL Oral BID  . feeding supplement (PRO-STAT SUGAR FREE 64)  30 mL Oral BID  . ferrous sulfate  325 mg Oral BID WC  . fluticasone  2 spray Each Nare Daily  . folic acid  1 mg Oral Daily  . insulin aspart  0-9 Units Subcutaneous TID WC  . lactulose  20 g Oral TID  . mirtazapine  30 mg Oral QHS  . multivitamin with minerals  1 tablet Oral Daily   . albumin human Stopped (05/27/19 0353)  . penicillin g continuous IV infusion    .  sodium bicarbonate (isotonic) infusion in sterile water 50 mL/hr at 05/28/19 0217    Allergies:  Allergies  Allergen Reactions  . Latex Anaphylaxis and Rash  . Gabapentin Nausea And Vomiting and Other (See Comments)    Social History   Socioeconomic History  . Marital status: Married    Spouse name: Not on file  . Number of children: Not on file  . Years of education: Not on file  . Highest education level: Not on file  Occupational History  . Not on file  Tobacco Use  . Smoking status: Current Some Day Smoker    Packs/day: 0.25    Types: Cigarettes  . Smokeless tobacco: Never Used  Substance and Sexual Activity  . Alcohol  use: No  . Drug use: No  . Sexual activity: Not on file  Other Topics Concern  . Not on file  Social History Narrative  . Not on file   Social Determinants of Health   Financial Resource Strain:   . Difficulty of Paying Living Expenses:   Food Insecurity:   . Worried About Charity fundraiser in the Last Year:   . Arboriculturist in the Last Year:   Transportation Needs:   . Film/video editor (Medical):   [name] Kitchen Lack of Transportation (Non-Medical):   Physical Activity:   . Days of Exercise per Week:   . Minutes of Exercise per Session:   Stress:   . Feeling of Stress :   Social Connections:   . Frequency of Communication with Friends and Family:   . Frequency of Social Gatherings with Friends and Family:   . Attends Religious Services:   . Active Member of Clubs  or Organizations:   . Attends Archivist Meetings:   [name] Kitchen Marital Status:   Intimate Partner Violence:   . Fear of Current or Ex-Partner:   . Emotionally Abused:   [name] Kitchen Physically Abused:   . Sexually Abused:      Family History  Problem Relation Age of Onset  . Hypertension Mother   . Diabetes Mellitus II Mother   . Breast cancer Mother   . Heart disease Father   . Cancer Sister      Review of Systems: A 12-system review of systems was performed and is negative except as noted in the HPI.  Labs: No results for input(s): CKTOTAL, CKMB, TROPONINI in the last 72 hours. Lab Results  Component Value Date   WBC 8.9 05/27/2019   HGB 7.3 (L) 05/27/2019   HCT 25.5 (L) 05/27/2019   MCV 75.7 (L) 05/27/2019   PLT 152 05/27/2019    Recent Labs  Lab 05/29/19 0422  NA 135  K 4.7  CL 103  CO2 22  BUN 46*  CREATININE 2.39*  CALCIUM 8.0*  GLUCOSE 132*   Lab Results  Component Value Date   TRIG 178 (H) 12/18/2016   No results found for: DDIMER  Radiology/Studies:  DG Tibia/Fibula Right  Result Date: 05/26/2019 CLINICAL DATA:  Pain status post fall EXAM: RIGHT TIBIA AND FIBULA - 2 VIEW  COMPARISON:  None. FINDINGS: There is soft tissue swelling about the lower extremity. The patient is status post prior plate screw fixation of the distal fibula. The hardware appears grossly intact with evidence for some loosening of the single transsyndesmotic screw. There may be a small suprapatellar joint effusion. There is no acute displaced fracture or dislocation. IMPRESSION: 1. No acute osseous abnormality. 2. There is nonspecific lower extremity edema. Electronically Signed   By: Constance Holster M.D.   On: 05/26/2019 16:27   US Venous Img Lower Bilateral (DVT)  Result Date: 05/26/2019 CLINICAL DATA:  Lower extremity pain and swelling. EXAM: BILATERAL LOWER EXTREMITY VENOUS DOPPLER ULTRASOUND TECHNIQUE: Gray-scale sonography with compression, as well as color and duplex ultrasound, were performed to evaluate the deep venous system(s) from the level of the common femoral vein through the popliteal and proximal calf veins. COMPARISON:  None. FINDINGS: VENOUS Evaluation of the patient's lower extremities was severely limited by patient habitus and diffuse anasarca. Flow is visualized within all of the identified venous structures, however adequate compressibility could not be assessed. OTHER Extensive bilateral lower extremity edema was noted. There are enlarged inguinal lymph nodes bilaterally. Several of the lymph nodes bilaterally appear to demonstrate diffusely thickened cortices. These were seen on the patient's prior CT of the pelvis from 10/26/2018. Limitations: Patient body habitus IMPRESSION: 1. Severely limited study secondary to patient body habitus and diffuse edema. No occlusive DVT was identified, however a nonocclusive DVT cannot be excluded in either lower extremity. 2. Enlarged bilateral inguinal lymph nodes of unknown clinical significance. These may be reactive, however malignant lymph nodes are not excluded. Electronically Signed   By: Constance Holster M.D.   On: 05/26/2019 21:26    US Venous Img Lower Bilateral (DVT)  Result Date: 05/05/2019 CLINICAL DATA:  Bilateral leg pain and weeping edema of the left lower extremity. EXAM: BILATERAL LOWER EXTREMITY VENOUS DOPPLER ULTRASOUND TECHNIQUE: Gray-scale sonography with compression, as well as color and duplex ultrasound, were performed to evaluate the deep venous system(s) from the level of the common femoral vein through the popliteal and proximal calf veins. COMPARISON:  Ultrasounds 02/25/2019,  08/07/2018 FINDINGS: VENOUS Normal color Doppler flow is seen within the femoral, superficial femoral, and popliteal veins, as well as the visualized calf veins. Visualized portions of profunda femoral vein and great saphenous vein unremarkable. No filling defects to suggest DVT on grayscale or color Doppler imaging. Doppler waveforms show normal direction of venous flow, normal respiratory phasicity and response to augmentation. OTHER Marked lower extremity edema. Several prominent inguinal nodes with preserved nodal architecture likely edematous and or reactive. Limitations: Limited examination of the lower extremities due to patient body habitus, extreme soft tissue edema in patient's intolerance of compression technique. IMPRESSION: Sonographic evaluation for deep venous thrombosis is limited by patient's body habitus, edema and inability to tolerate compression. Normal color flow is seen within the deep venous system of both lower extremities however small noncompressible thrombus cannot be fully excluded. If clinical symptoms are inconsistent or if there are persistent or worsening symptoms, further imaging (possibly involving the iliac veins) may be warranted. Electronically Signed   By: Lovena Le M.D.   On: 05/05/2019 01:24   DG Chest Port 1 View  Result Date: 05/26/2019 CLINICAL DATA:  Pain EXAM: PORTABLE CHEST 1 VIEW COMPARISON:  05/04/2019 FINDINGS: There is significant cardiomegaly. The appearance of the cardiac silhouette is  unchanged since prior study. Areas of scarring atelectasis are again noted. The patient is status post prior median sternotomy with valve replacement. There is vascular congestion with mild interstitial pulmonary edema. There is no acute osseous abnormality. IMPRESSION: Cardiomegaly with mild interstitial pulmonary edema. Electronically Signed   By: Constance Holster M.D.   On: 05/26/2019 16:26   DG Chest Portable 1 View  Result Date: 05/04/2019 CLINICAL DATA:  Shortness of breath. EXAM: PORTABLE CHEST 1 VIEW COMPARISON:  February 23, 2019. FINDINGS: Stable cardiomegaly. Status post coronary artery bypass graft. No pneumothorax or pleural effusion is noted. Mild central pulmonary vascular congestion is noted. Left midlung subsegmental atelectasis is noted. Bony thorax is unremarkable. IMPRESSION: Stable cardiomegaly with mild central pulmonary vascular congestion. Left midlung subsegmental atelectasis is noted. Electronically Signed   By: Marijo Conception M.D.   On: 05/04/2019 12:36   DG Foot 2 Views Right  Result Date: 05/26/2019 CLINICAL DATA:  Status post fall. Fatigue. EXAM: RIGHT FOOT - 2 VIEW COMPARISON:  February 2011 FINDINGS: There is no evidence of fracture or dislocation. There is no evidence of arthropathy or other focal bone abnormality. Diffuse soft tissue swelling. Partially visualize fibular fracture fixation. IMPRESSION: 1. No acute fracture or dislocation identified about the right foot. 2. Diffuse soft tissue swelling. Electronically Signed   By: Fidela Salisbury M.D.   On: 05/26/2019 16:27   ECHOCARDIOGRAM COMPLETE  Result Date: 05/06/2019    ECHOCARDIOGRAM REPORT   Patient Name:   [name] Date of Exam: 05/06/2019 Medical Rec #:  [id number]         Height:       65.0 in Accession #:    [id number]        Weight:       238.3 lb Date of Birth:  01/27/1955         BSA:          2.131 m Patient Age:    8 years          BP:           135/67 mmHg Patient Gender: F                  HR:  74 bpm. Exam Location:  [facility] Procedure: 2D Echo, Color Doppler and Cardiac Doppler Indications:     Tricuspid valve disease 107.9  History:         Patient has prior history of Echocardiogram examinations, most                  recent 10/29/2018. CHF, COPD; Risk Factors:Diabetes. NSTEMI.  Sonographer:     Sherrie Sport RDCS (AE) Referring Phys:  [place] Diagnosing Phys: Kathlyn Sacramento MD IMPRESSIONS  1. Left ventricular ejection fraction, by estimation, is 50 to 55%. The left ventricle has low normal function. The left ventricle has no regional wall motion abnormalities. There is moderate left ventricular hypertrophy. Left ventricular diastolic parameters are consistent with Grade II diastolic dysfunction (pseudonormalization).  2. Right ventricular systolic function is normal. The right ventricular size is normal. There is moderately elevated pulmonary artery systolic pressure.  3. Left atrial size was moderately dilated.  4. Right atrial size was mildly dilated.  5. The mitral valve is abnormal. Moderate mitral valve regurgitation. No evidence of mitral stenosis.  6. Tricuspid valve regurgitation is moderate.  7. The aortic valve is normal in structure. Aortic valve regurgitation is not visualized. Mild aortic valve stenosis. FINDINGS  Left Ventricle: Left ventricular ejection fraction, by estimation, is 50 to 55%. The left ventricle has low normal function. The left ventricle has no regional wall motion abnormalities. The left ventricular internal cavity size was normal in size. There is moderate left ventricular hypertrophy. Left ventricular diastolic parameters are consistent with Grade II diastolic dysfunction (pseudonormalization). Right Ventricle: The right ventricular size is normal. No increase in right ventricular wall thickness. Right ventricular systolic function is normal. There is moderately elevated pulmonary artery systolic pressure. The tricuspid regurgitant velocity is 2.86  m/s, and with an assumed right atrial pressure of 10 mmHg, the estimated right ventricular systolic pressure is 51.7 mmHg. Left Atrium: Left atrial size was moderately dilated. Right Atrium: Right atrial size was mildly dilated. Pericardium: There is no evidence of pericardial effusion. Mitral Valve: The mitral valve is abnormal. There is severe thickening of the mitral valve leaflet(s). There is severe calcification of the mitral valve leaflet(s). Normal mobility of the mitral valve leaflets. Moderate mitral annular calcification. Moderate mitral valve regurgitation. No evidence of mitral valve stenosis. Tricuspid Valve: The tricuspid valve is normal in structure. Tricuspid valve regurgitation is moderate . No evidence of tricuspid stenosis. Aortic Valve: The aortic valve is normal in structure. Aortic valve regurgitation is not visualized. Mild aortic stenosis is present. Aortic valve mean gradient measures 7.3 mmHg. Aortic valve peak gradient measures 12.6 mmHg. Aortic valve area, by VTI measures 1.38 cm. Pulmonic Valve: The pulmonic valve was normal in structure. Pulmonic valve regurgitation is mild. No evidence of pulmonic stenosis. Aorta: The aortic root is normal in size and structure. Venous: The inferior vena cava was not well visualized. IAS/Shunts: No atrial level shunt detected by color flow Doppler.  LEFT VENTRICLE PLAX 2D LVIDd:         4.62 cm  Diastology LVIDs:         3.28 cm  LV e' lateral:   8.92 cm/s LV PW:         1.51 cm  LV E/e' lateral: 16.3 LV IVS:        1.48 cm  LV e' medial:    5.44 cm/s LVOT diam:     2.00 cm  LV E/e' medial:  26.7 LV SV:  50 LV SV Index:   23 LVOT Area:     3.14 cm  RIGHT VENTRICLE RV Basal diam:  4.16 cm RV S prime:     5.22 cm/s TAPSE (M-mode): 3.0 cm LEFT ATRIUM              Index       RIGHT ATRIUM           Index LA diam:        4.50 cm  2.11 cm/m  RA Area:     21.60 cm LA Vol (A2C):   115.0 ml 53.96 ml/m RA Volume:   67.40 ml  31.63 ml/m LA Vol (A4C):    44.8 ml  21.02 ml/m LA Biplane Vol: 80.1 ml  37.59 ml/m  AORTIC VALVE                    PULMONIC VALVE AV Area (Vmax):    1.19 cm     PV Vmax:        0.64 m/s AV Area (Vmean):   1.20 cm     PV Peak grad:   1.6 mmHg AV Area (VTI):     1.38 cm     RVOT Peak grad: 3 mmHg AV Vmax:           177.67 cm/s AV Vmean:          124.333 cm/s AV VTI:            0.359 m AV Peak Grad:      12.6 mmHg AV Mean Grad:      7.3 mmHg LVOT Vmax:         67.20 cm/s LVOT Vmean:        47.300 cm/s LVOT VTI:          0.158 m LVOT/AV VTI ratio: 0.44  AORTA Ao Root diam: 2.00 cm MITRAL VALVE                TRICUSPID VALVE MV Area (PHT): 3.12 cm     TR Peak grad:   32.7 mmHg MV Decel Time: 243 msec     TR Vmax:        286.00 cm/s MV E velocity: 145.00 cm/s MV A velocity: 71.80 cm/s   SHUNTS MV E/A ratio:  2.02         Systemic VTI:  0.16 m                             Systemic Diam: 2.00 cm Kathlyn Sacramento MD Electronically signed by Kathlyn Sacramento MD Signature Date/Time: 05/06/2019/11:59:18 AM    Final    [place] ASCITES (ABDOMEN LIMITED)  Result Date: 05/04/2019 CLINICAL DATA:  Abdominal distension. EXAM: LIMITED ABDOMEN ULTRASOUND FOR ASCITES TECHNIQUE: Limited ultrasound survey for ascites was performed in all four abdominal quadrants. COMPARISON:  None. FINDINGS: No definite ascites is noted in any quadrant of the abdomen. IMPRESSION: No ascites is noted. Electronically Signed   By: Marijo Conception M.D.   On: 05/04/2019 17:03    Wt Readings from Last 3 Encounters:  05/29/19 120.2 kg  05/10/19 110.6 kg  02/26/19 90.3 kg    EKG: Sinus rhythm  Physical Exam:  Blood pressure (!) 96/56, pulse 77, temperature 98.1 F (36.7 C), temperature source Oral, resp. rate 13, height 5' 5"  (1.651 m), weight 120.2 kg, SpO2 92 %. Body mass index is 44.1 kg/m. General: Well developed, well nourished, in  no acute distress. Head: Normocephalic, atraumatic, sclera non-icteric, no xanthomas, nares are without discharge.  Neck: Negative for  carotid bruits. JVD not elevated. Lungs: Clear bilaterally to auscultation without wheezes, rales, or rhonchi. Breathing is unlabored. Heart: RRR with S1 S2. No murmurs, rubs, or gallops appreciated. Abdomen: Soft, non-tender, non-distended with normoactive bowel sounds. No hepatomegaly. No rebound/guarding. No obvious abdominal masses. Msk:  Strength and tone appear normal for age. Extremities: No clubbing or cyanosis. No edema.  Distal pedal pulses are 2+ and equal bilaterally. Neuro: Alert and oriented X 3. No facial asymmetry. No focal deficit. Moves all extremities spontaneously. Psych:  Responds to questions appropriately with a normal affect.     Assessment and Plan  Patient with history of HFpEF, history of coronary disease status post carotid bypass grafting x3 in [place] in 2019 with an ischemic cardiomyopathy EF of 40 to 45%, history of bioprosthetic aortic valve.  Admitted with bacteremia.  Transesophageal echo recommended by ID.  Risk and benefits were explained. Signed, Teodoro Spray MD 05/29/2019, 3:22 PM Pager: [phone]

## 2019-05-29 NOTE — Progress Notes (Signed)
Palliative: [name], [name], is lying quietly in bed with her lunch tray in front of her.  She is quite sleepy today, and will only briefly make but not keep eye contact.  She is able to make her basic needs known, but there is no family at bedside at this time.  I asked [name] if she has any questions about our discussion yesterday, and she tells me that she does not.  I asked if she were able to talk with her husband [name] yesterday.  She tells me that she did speak with him, but when I ask what they talked about she said, "diseases".  Again, she keeps her eyes closed through most of her conversation.   I asked if she and [name] were able to talk about CODE STATUS, life support.  [name] states that they were, but tells me that they did not come to any decisions about CODE STATUS.  I shared that we will provide life support unless she requests that we do not.  I asked her to tell me how long she would want life support.  I reminded her that after 2 weeks of endotracheal intubation she would receive tracheostomy and PEG tube, long-term care facility.  She tells me that she is not sure.  I encouraged her to consider these choices.  Conference with attending, bedside nursing staff, transition of care team related to patient condition, needs, goals of care discussion. Detail conference with transition of care team related to available services.  Plan: At this point full scope/full code.  Rehospitalize as needed.  Outpatient home health/palliative services have been scheduled and attempting to reach [name], but have been unable to contact.  57 minutes Tanya Axe, NP Palliative Medicine Team Team Phone # [phone] Greater than 50% of this time was spent counseling and coordinating care related to the above assessment and plan.

## 2019-05-29 NOTE — Consult Note (Addendum)
PHARMACIST - PHYSICIAN COMMUNICATION  CONCERNING:  Enoxaparin (Lovenox) for DVT Prophylaxis    RECOMMENDATION: Patient was prescribed enoxaprin 3m q12 hours for VTE prophylaxis.   Filed Weights   05/26/19 1459 05/28/19 0442 05/29/19 0431  Weight: 110.6 kg (243 lb 12.8 oz) 117.2 kg (258 lb 4.8 oz) 120.2 kg (265 lb)    Body mass index is 44.1 kg/m.  Estimated Creatinine Clearance: 30.9 mL/min (A) (by C-G formula based on SCr of 2.39 mg/dL (H)).   [place] on [place] candidate for enoxaparin 448mevery 12 hour dosing due to BMI being >40, but CrCl is borderline and Scr is trending up, will change enoxaparin back to 40 mg daily.   DESCRIPTION: [facility]  Patient is now receiving enoxaparin 4055mvery 24 hours.    KisOswald HillockharmD Clinical Pharmacist  05/29/2019 9:02 AM

## 2019-05-29 NOTE — Progress Notes (Signed)
PT Cancellation Note  Patient Details Name: ELERI RUBEN MRN: [id number] DOB: [birth year]   Cancelled Treatment:    Reason Eval/Treat Not Completed: Fatigue/lethargy limiting ability to participate (Consult received and chart reviewed. Patient sleeping soundly upon arrival to room; verbally responds to therapist in 1-2 word answers at times, but does not open eyes or actively engage with therapist.  Requires max cuing/stimulation for wakefulness, even with such, consistently drifting to sleep between questions.  Will re-attempt mobility assessment next date when patient more awake and able to actively participate)   Brittanny Levenhagen H. Owens Shark, PT, DPT, NCS 05/29/19, 3:39 PM [phone]

## 2019-05-29 NOTE — Progress Notes (Signed)
Pharmacy - Antimicrobial Stewardship    Patient with group C streptococcus bacteremia.  Patient was evaluated by ID and plan to change to PCN G continuous infusion (MIC  < 0.06).    Plan: 1. Based on current renal function and increasing SCr, reduce normal dose by 25% of 24 million units/24h to 18 million units/24h.  Furosemide held for increasing SCr 2. Monitor volume status with PCN G 1L/day + MIVF   Doreene Eland, PharmD, BCPS.   Work Cell: [phone] 05/29/2019 3:08 PM

## 2019-05-29 NOTE — Progress Notes (Signed)
Patient ID: CONSUELO SUTHERS, female   DOB: April 26, 1955, 64 y.o.   MRN: [id number] [facility] PROGRESS NOTE  EMILIYA CHRETIEN XBD:[id number] DOB: 05-Mar-1955 DOA: 05/26/2019 PCP: [facility]  HPI/Subjective: Patient feeling weak.  She states that she cannot walk and has not walked in a while.  Appetite is poor.  Not feeling well at all.  Patient in a lot of pain when I am looking at the wound on her right leg.  Objective: Vitals:   05/29/19 1117 05/29/19 1337  BP: (!) 87/56 (!) 96/56  Pulse: 81 77  Resp: 13   Temp: 98.1 F (36.7 C)   SpO2: 91% 92%    Intake/Output Summary (Last 24 hours) at 05/29/2019 1602 Last data filed at 05/29/2019 0600 Gross per 24 hour  Intake 781.68 ml  Output 50 ml  Net 731.68 ml   Filed Weights   05/26/19 1459 05/28/19 0442 05/29/19 0431  Weight: 110.6 kg 117.2 kg 120.2 kg    ROS: Review of Systems  Constitutional: Negative for fever.  Eyes: Negative for blurred vision.  Respiratory: Negative for cough and shortness of breath.   Cardiovascular: Negative for chest pain.  Gastrointestinal: Negative for abdominal pain, nausea and vomiting.  Genitourinary: Negative for dysuria.  Musculoskeletal: Positive for joint pain.  Neurological: Negative for dizziness.   Exam: Physical Exam  HENT:  Nose: No mucosal edema.  Mouth/Throat: No oropharyngeal exudate or posterior oropharyngeal edema.  Eyes: Conjunctivae and lids are normal.  Neck: Carotid bruit is not present.  Cardiovascular: S1 normal and S2 normal. Exam reveals no gallop.  No murmur heard. Respiratory: No respiratory distress. She has decreased breath sounds in the right lower field and the left lower field. She has no wheezes. She has no rhonchi. She has no rales.  GI: Soft. Bowel sounds are normal. There is no abdominal tenderness.  Musculoskeletal:     Right ankle: Swelling present.     Left ankle: Swelling present.  Lymphadenopathy:    She has no cervical adenopathy.   Neurological: She is alert.  Patient unable to lift her legs up off the bed  Skin: Skin is warm. No rash noted. Nails show no clubbing.  Left shin a couple areas of skin breakdown with no drainage Right shin larger area of skin breakdown with slight greenish drainage  Psychiatric: She has a normal mood and affect.      Data Reviewed: Basic Metabolic Panel: Recent Labs  Lab 05/26/19 1457 05/27/19 0530 05/28/19 0607 05/29/19 0422  NA 130* 130* 134* 135  K 4.7 4.7 4.6 4.7  CL 103 101 102 103  CO2 18* 20* 21* 22  GLUCOSE 314* 193* 197* 132*  BUN 28* 32* 45* 46*  CREATININE 1.88* 1.88* 2.28* 2.39*  CALCIUM 8.3* 8.1* 8.2* 8.0*   CBC: Recent Labs  Lab 05/26/19 1457 05/27/19 0530  WBC 6.9 8.9  NEUTROABS 6.5 8.3*  HGB 7.8* 7.3*  HCT 27.1* 25.5*  MCV 75.9* 75.7*  PLT 179 152   Cardiac Enzymes: Recent Labs  Lab 05/26/19 1457  CKTOTAL 162   BNP (last 3 results) Recent Labs    08/17/18 1030 05/04/19 1133 05/26/19 1457  BNP 861.0* 778.0* 827.0*    CBG: Recent Labs  Lab 05/28/19 1136 05/28/19 1626 05/28/19 2055 05/29/19 0737 05/29/19 1117  GLUCAP 195* 215* 154* 116* 151*    Recent Results (from the past 240 hour(s))  Culture, blood (routine x 2)     Status: Abnormal   Collection Time: 05/26/19  2:57 PM   Specimen: BLOOD  Result Value Ref Range Status   Specimen Description   Final    BLOOD BLOOD LEFT FOREARM Performed at [facility], [address] [place], [place] [address]    Special Requests   Final    BOTTLES DRAWN AEROBIC AND ANAEROBIC Blood Culture results may not be optimal due to an inadequate volume of blood received in culture bottles Performed at [facility], [address] [place], [place] [address]    Culture  Setup Time   Final    GRAM POSITIVE COCCI IN BOTH AEROBIC AND ANAEROBIC BOTTLES CRITICAL RESULT CALLED TO, READ BACK BY AND VERIFIED WITH: DAVID BESANTI 05/27/19 AT 0237 HS    Culture (A)  Final     STREPTOCOCCUS GROUP C SUSCEPTIBILITIES PERFORMED ON PREVIOUS CULTURE WITHIN THE LAST 5 DAYS. Performed at [facility], [address], [place], [place] [address]    Report Status 05/29/2019 FINAL  Final  Culture, blood (routine x 2)     Status: Abnormal   Collection Time: 05/26/19  2:57 PM   Specimen: BLOOD  Result Value Ref Range Status   Specimen Description   Final    BLOOD LEFT ANTECUBITAL Performed at [facility], [address] [place], [place] [address]    Special Requests   Final    BOTTLES DRAWN AEROBIC AND ANAEROBIC Blood Culture adequate volume Performed at [facility], [address] [place] [address], [place], [place] [address]    Culture  Setup Time   Final    GRAM POSITIVE COCCI IN BOTH AEROBIC AND ANAEROBIC BOTTLES CRITICAL VALUE NOTED.  VALUE IS CONSISTENT WITH PREVIOUSLY REPORTED AND CALLED VALUE. Performed at [facility], [place], [place] [address]    Culture STREPTOCOCCUS GROUP C (A)  Final   Report Status 05/29/2019 FINAL  Final   Organism ID, Bacteria STREPTOCOCCUS GROUP C  Final      Susceptibility   Streptococcus group c - MIC*    CLINDAMYCIN <=0.25 SENSITIVE Sensitive     AMPICILLIN <=0.25 SENSITIVE Sensitive     ERYTHROMYCIN <=0.12 SENSITIVE Sensitive     VANCOMYCIN 0.25 SENSITIVE Sensitive     CEFTRIAXONE <=0.12 SENSITIVE Sensitive     LEVOFLOXACIN 0.5 SENSITIVE Sensitive     PENICILLIN Value in next row Sensitive      SENSITIVE<=0.06    * STREPTOCOCCUS GROUP C  Respiratory Panel by RT PCR (Flu A&B, Covid) - Nasopharyngeal Swab     Status: None   Collection Time: 05/26/19  2:57 PM   Specimen: Nasopharyngeal Swab  Result Value Ref Range Status   SARS Coronavirus 2 by RT PCR NEGATIVE NEGATIVE Final    Comment: (NOTE) SARS-CoV-2 target nucleic acids are NOT DETECTED. The SARS-CoV-2 RNA is generally detectable in upper respiratoy specimens during the acute phase of infection. The lowest concentration of  SARS-CoV-2 viral copies this assay can detect is 131 copies/mL. A negative result does not preclude SARS-Cov-2 infection and should not be used as the sole basis for treatment or other patient management decisions. A negative result may occur with  improper specimen collection/handling, submission of specimen other than nasopharyngeal swab, presence of viral mutation(s) within the areas targeted by this assay, and inadequate number of viral copies (<131 copies/mL). A negative result must be combined with clinical observations, patient history, and epidemiological information. The expected result is Negative. Fact Sheet for Patients:  PinkCheek.be Fact Sheet for Healthcare Providers:  GravelBags.it This test is not  yet ap proved or cleared by the [place] and  has been authorized for detection and/or diagnosis of SARS-CoV-2 by FDA under an Emergency Use Authorization (EUA). This EUA will remain  in effect (meaning this test can be used) for the duration of the COVID-19 declaration under Section 564(b)(1) of the Act, 21 U.S.C. section 360bbb-3(b)(1), unless the authorization is terminated or revoked sooner.    Influenza A by PCR NEGATIVE NEGATIVE Final   Influenza B by PCR NEGATIVE NEGATIVE Final    Comment: (NOTE) The Xpert Xpress SARS-CoV-2/FLU/RSV assay is intended as an aid in  the diagnosis of influenza from Nasopharyngeal swab specimens and  should not be used as a sole basis for treatment. Nasal washings and  aspirates are unacceptable for Xpert Xpress SARS-CoV-2/FLU/RSV  testing. Fact Sheet for Patients: PinkCheek.be Fact Sheet for Healthcare Providers: GravelBags.it This test is not yet approved or cleared by the [place] FDA and  has been authorized for detection and/or diagnosis of SARS-CoV-2 by  FDA under an Emergency Use Authorization (EUA).  This EUA will remain  in effect (meaning this test can be used) for the duration of the  Covid-19 declaration under Section 564(b)(1) of the Act, 21  U.S.C. section 360bbb-3(b)(1), unless the authorization is  terminated or revoked. Performed at [facility], [address] [place], [place] [address]   Blood Culture ID Panel (Reflexed)     Status: Abnormal   Collection Time: 05/26/19  2:57 PM  Result Value Ref Range Status   Enterococcus species NOT DETECTED NOT DETECTED Final   Listeria monocytogenes NOT DETECTED NOT DETECTED Final   Staphylococcus species NOT DETECTED NOT DETECTED Final   Staphylococcus aureus (BCID) NOT DETECTED NOT DETECTED Final   Streptococcus species DETECTED (A) NOT DETECTED Final    Comment: Not Enterococcus species, Streptococcus agalactiae, Streptococcus pyogenes, or Streptococcus pneumoniae. CRITICAL RESULT CALLED TO, READ BACK BY AND VERIFIED WITH: DAVID BESANTI 05/27/19 AT 7673 HS    Streptococcus agalactiae NOT DETECTED NOT DETECTED Final   Streptococcus pneumoniae NOT DETECTED NOT DETECTED Final   Streptococcus pyogenes NOT DETECTED NOT DETECTED Final   Acinetobacter baumannii NOT DETECTED NOT DETECTED Final   Enterobacteriaceae species NOT DETECTED NOT DETECTED Final   Enterobacter cloacae complex NOT DETECTED NOT DETECTED Final   Escherichia coli NOT DETECTED NOT DETECTED Final   Klebsiella oxytoca NOT DETECTED NOT DETECTED Final   Klebsiella pneumoniae NOT DETECTED NOT DETECTED Final   Proteus species NOT DETECTED NOT DETECTED Final   Serratia marcescens NOT DETECTED NOT DETECTED Final   Haemophilus influenzae NOT DETECTED NOT DETECTED Final   Neisseria meningitidis NOT DETECTED NOT DETECTED Final   Pseudomonas aeruginosa NOT DETECTED NOT DETECTED Final   Candida albicans NOT DETECTED NOT DETECTED Final   Candida glabrata NOT DETECTED NOT DETECTED Final   Candida krusei NOT DETECTED NOT DETECTED Final   Candida parapsilosis NOT  DETECTED NOT DETECTED Final   Candida tropicalis NOT DETECTED NOT DETECTED Final    Comment: Performed at [facility], [address] [place], [place] [address]  Culture, blood (x 2)     Status: None (Preliminary result)   Collection Time: 05/27/19  6:54 PM   Specimen: BLOOD  Result Value Ref Range Status   Specimen Description BLOOD BLOOD RIGHT HAND  Final   Special Requests   Final    BOTTLES DRAWN AEROBIC AND ANAEROBIC Blood Culture adequate volume   Culture   Final    NO GROWTH 2 DAYS Performed  at [facility], [address] [place], [place] [address]    Report Status PENDING  Incomplete  Culture, blood (x 2)     Status: None (Preliminary result)   Collection Time: 05/27/19  6:56 PM   Specimen: BLOOD  Result Value Ref Range Status   Specimen Description BLOOD BLOOD LEFT HAND  Final   Special Requests   Final    BOTTLES DRAWN AEROBIC AND ANAEROBIC Blood Culture results may not be optimal due to an inadequate volume of blood received in culture bottles   Culture   Final    NO GROWTH 2 DAYS Performed at [facility], [address], [place], [place] [address]    Report Status PENDING  Incomplete     Scheduled Meds: . ammonium lactate  1 application Topical BID  . vitamin C  250 mg Oral BID  . atorvastatin  40 mg Oral Daily  . enoxaparin (LOVENOX) injection  40 mg Subcutaneous Q24H  . feeding supplement (ENSURE ENLIVE)  237 mL Oral BID  . feeding supplement (PRO-STAT SUGAR FREE 64)  30 mL Oral BID  . ferrous sulfate  325 mg Oral BID WC  . fluticasone  2 spray Each Nare Daily  . folic acid  1 mg Oral Daily  . insulin aspart  0-9 Units Subcutaneous TID WC  . lactulose  20 g Oral TID  . mirtazapine  30 mg Oral QHS  . multivitamin with minerals  1 tablet Oral Daily   Continuous Infusions: . albumin human Stopped (05/27/19 0353)  . penicillin g continuous IV infusion    .  sodium bicarbonate (isotonic) infusion in sterile water 50 mL/hr at  05/28/19 0217    Assessment/Plan:  1. Sepsis with group C streptococcus with acute kidney injury.  Present on admission.  Case discussed with infectious disease and they recommended continuous penicillin G infusion and TEE to evaluate for endocarditis. 2. Acute metabolic encephalopathy.  Check ammonia level tomorrow morning.  Patient on lactulose 3. Hypotension.  Hold all antihypertensive medications. 4. Acute kidney injury on chronic kidney disease stage IIIb.  Gentle IV fluid. 5. Pressure injury distal thigh, present on admission please see description below. 6. Bilateral lower extremity wounds.  Continue antibiotics 7. 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  Patient on sliding scale insulin 8. Hyperlipidemia on Lipitor 9. Immobility  Pressure Injury 05/05/19 Thigh Distal;Left;Posterior Unstageable - Full thickness tissue loss in which the base of the injury is covered by slough (yellow, tan, gray, green or brown) and/or eschar (tan, brown or black) in the wound bed. 4"x4" area with m (Active)  05/05/19 0700  Location: Thigh  Location Orientation: Distal;Left;Posterior  Staging: Unstageable - Full thickness tissue loss in which the base of the injury is covered by slough (yellow, tan, gray, green or brown) and/or eschar (tan, brown or black) in the wound bed.  Wound Description (Comments): 4"x4" area with mixture of pink granulated wound bed and yellow slough.  Present on Admission: Yes       Code Status:     Code Status Orders  (From admission, onward)         Start     Ordered   05/28/19 1030  Full code  Continuous     05/28/19 1029        Code Status History    Date Active Date Inactive Code Status Order ID Comments User Context   05/04/2019 1534 05/10/2019 2332 Full Code [id number]  Debbe Odea,  MD ED   02/23/2019 2229 02/27/2019 1835 Full Code [id number]  Lenore Cordia, MD ED   12/16/2018 1704 01/02/2019 1845 Full Code [id number]  Eugenie Filler, MD ED   11/20/2018 1812 12/08/2018 0528 Full Code [id number]  Modena Nunnery D, DO ED   10/26/2018 0048 11/08/2018 0023 Full Code [id number]  Lang Snow, NP ED   08/17/2018 1820 08/22/2018 1805 Full Code [id number]  Dustin Flock, MD Inpatient   06/20/2018 2312 06/27/2018 2119 Full Code [id number]  Mayer Camel, NP ED   01/13/2018 1540 01/16/2018 1859 Full Code [id number]  Loletha Grayer, MD ED   11/03/2017 1758 11/06/2017 2136 Full Code [id number]  Salary, Avel Peace, MD Inpatient   09/26/2017 2353 09/30/2017 1404 Full Code [id number]  Lance Coon, MD Inpatient   08/27/2017 0347 08/29/2017 2054 Full Code [id number]  Harrie Foreman, MD Inpatient   08/07/2017 1704 08/09/2017 2237 Full Code [id number]  Saundra Shelling, MD Inpatient   12/18/2016 1146 12/26/2016 2032 Full Code [id number]  Vaughan Basta, MD Inpatient   12/18/2016 0959 12/18/2016 1146 Full Code [id number]  Vaughan Basta, MD ED   12/04/2015 0509 12/07/2015 1756 Full Code [id number]  Holley Raring, NP ED   10/28/2015 1640 10/29/2015 1711 Full Code [id number]  Bettey Costa, MD Inpatient   08/29/2015 1438 08/30/2015 1750 Full Code [id number]  Hugelmeyer, Ubaldo Glassing, DO Inpatient   Advance Care Planning Activity     Family Communication: Tried to reach the husband on the phone.  The phone number that says it is the home phone the person who answered said I had the wrong number.  The cell phone number was an answer and nobody said anything and then it got disconnected. Disposition Plan: Status is: Inpatient  Dispo: The patient is from: Home              Anticipated d/c is to: Home but would probably be better off in a rehab.              Anticipated d/c date is: Depending on progress may be 06/03/2019 or sooner if does better quicker.              Patient currently placed on continuous IV antibiotic drip by infectious disease.  Awaiting to see how long she wants to do the IV drip for.  TEE needed to rule out  endocarditis.  The patient has had this group C streptococcus sepsis last year also and I want a make sure the correct plan is set up prior to disposition.  Consultants:  Infectious disease  Palliative care  Antibiotics:  Continuous penicillin G infusion  Time spent: 28 minutes, case discussed with cardiology and infectious disease.  [place]ley  [facility] [employer]

## 2019-05-29 NOTE — Consult Note (Signed)
NAME: [name]  DOB: 1955-02-11  MRN: [id number]  Date/Time: 05/29/2019 10:03 AM  REQUESTING PROVIDER Dr. Leslye Peer Subjective:  REASON FOR CONSULT: Bacteremia ? [name] is a 64 y.o. 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  Creatinine was 1.88 BNP was 827, WBC of 6.9 and hemoglobin of 7.8 and platelet of 179.  Blood cultures were sent.   came back positive for Streptococcus group C and I am asked to see the patient for the same. She had same bacteremia last year Pt says she lives with her husband who is wheel chair bound. She is not ambulant Chronically ill, says she had fever    05/04/2019 until 05/10/2019 admitted with leg pain shortness of breath ascites and anemia and was treated for acute on chronic diastolic congestive heart failure.  Also found to have chronic venous ulcers in the legs.  Patient in October 25, 2018 had Streptococcus group C bacteremia 4 out of 4 bottles.  Because of prior prosthetic valve she underwent a TEE and was negative for endocarditis.  It was assumed that the source could have been the GI tract because of GI bleed with AVMs.  Repeat blood culture from 10/28/2018 was negative.  She was treated with 10 days of IV antibiotics and discharged. She had a capsule endoscopy which was significant for AVMs. She was later admitted to [facility] long hospital between 11/20/2018 until 12/07/2018 for acute blood loss anemia due to GI bleed, encephalopathy. During her  hospitalization a small bowel endoscopy was attempted but did not reveal etiology of bleeding despite continued GI bleed.  Patient had received 5 transfusions of packed RBCs.  Blood culture that hospitalization was negative. She was readmitted between 12/16/2018 until 01/02/2019 with GI bleed and her hemoglobin was 4.8 on admission and she received transfusions. 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  APC was applied.  Because of ongoing maroon-colored liquid stools further enteroscopy's were done and that did not reveal any bleeding source.  Patient underwent colonoscopy 12/28/2018  and the findings were hemorrhoids, blood in the entire examined colon, multiple 2 to 6 mm polyps at the hepatic flexure and ascending colon and in the cecum.   polyp in the proximal ascending colon resection was not attempted.  2 bleeding colonic angiectasis treated with argon plasma coagulation. clips were placed.  Between 02/23/2019 to 02/25/2018 when she was admitted to [facility] with a fall and found to have UTI with Enterobacter and given IV ceftriaxone and p.o. Keflex.   Past Medical History:  Diagnosis Date  . Cervical cancer ([place])   . CHF (congestive heart failure) ([place])   . Chronic anemia   . COPD (chronic obstructive pulmonary disease) ([place])   . Diabetes mellitus without complication ([place])   . Gastric ulcer   . NSTEMI (non-ST elevated myocardial infarction) ([place])   . Upper GI bleed     Past surgical history CABG- [place] Aortic valve replacement Tonsillectomy Cholecystectomy  Appendectomy ORIF ankle fracture right Tubal ligation Carpal tunnel repair   Social History   Socioeconomic History  . Marital status: Married    Spouse name: Not on file  . Number of children: Not on file  . Years of education: Not on file  . Highest education level: Not on file  Occupational History  . Not on file  Tobacco Use  . Smoking  status: Current Some Day Smoker    Packs/day: 0.25    Types: Cigarettes  . Smokeless tobacco: Never Used  Substance and Sexual Activity  . Alcohol use: No  . Drug use: No  . Sexual activity: Not on file  Other Topics Concern  . Not on file  Social History Narrative  . Not on file   Social Determinants of Health   Financial Resource Strain:   . Difficulty of Paying Living Expenses:   Food Insecurity:   . Worried About Charity fundraiser in the Last Year:   . Arboriculturist in the Last Year:   Transportation Needs:   . Film/video editor (Medical):   [name] Kitchen Lack of Transportation (Non-Medical):   Physical Activity:   . Days of Exercise per Week:   . Minutes of Exercise per Session:   Stress:   . Feeling of Stress :   Social Connections:   . Frequency of Communication with Friends and Family:   . Frequency of Social Gatherings with Friends and Family:   . Attends Religious Services:   . Active Member of Clubs or Organizations:   . Attends Archivist Meetings:   [name] Kitchen Marital Status:   Intimate Partner Violence:   . Fear of Current or Ex-Partner:   . Emotionally Abused:   [name] Kitchen Physically Abused:   . Sexually Abused:     Family History  Problem Relation Age of Onset  . Hypertension Mother   . Diabetes Mellitus II Mother   . Breast cancer Mother   . Heart disease Father   . Cancer Sister    Allergies  Allergen Reactions  . Latex Anaphylaxis and Rash  . Gabapentin Nausea And Vomiting and Other (See Comments)    ? Current Facility-Administered Medications  Medication Dose Route Frequency Provider Last Rate Last Admin  . albumin human 25 % solution 25 g  25 g Intravenous Once Sharion Settler, NP   Stopped at 05/27/19 0353  . ammonium lactate (LAC-HYDRIN) 12 % lotion 1 application  1 application Topical BID Swayze, Ava, DO   1 application at 42/35/36 0959  . ascorbic acid (VITAMIN C) tablet 250 mg  250 mg Oral BID Swayze, Ava, DO   250 mg at 05/29/19 0934  .  atorvastatin (LIPITOR) tablet 40 mg  40 mg Oral Daily Swayze, Ava, DO   40 mg at 05/29/19 0934  . carvedilol (COREG) tablet 3.125 mg  3.125 mg Oral BID WC Swayze, Ava, DO   3.125 mg at 05/29/19 0933  . cefTRIAXone (ROCEPHIN) 2 g in sodium chloride 0.9 % 100 mL IVPB  2 g Intravenous Q24H Berton Mount, RPH 200 mL/hr at 05/29/19 0950 2 g at 05/29/19 0950  . doxycycline (VIBRA-TABS) tablet 100 mg  100 mg Oral Q12H Oswald Hillock, RPH      . enoxaparin (LOVENOX) injection 40 mg  40 mg Subcutaneous Q24H Oswald Hillock, [place]      . feeding supplement (ENSURE ENLIVE) (ENSURE ENLIVE) liquid 237 mL  237 mL Oral BID Swayze, Ava, DO   237  mL at 05/29/19 0950  . feeding supplement (PRO-STAT SUGAR FREE 64) liquid 30 mL  30 mL Oral BID Swayze, Ava, DO   30 mL at 05/29/19 0945  . ferrous sulfate tablet 325 mg  325 mg Oral BID WC Swayze, Ava, DO   325 mg at 05/29/19 0934  . fluticasone (FLONASE) 50 MCG/ACT nasal spray 2 spray  2 spray Each Nare Daily Swayze, Ava, DO   2 spray at 05/29/19 0933  . folic acid (FOLVITE) tablet 1 mg  1 mg Oral Daily Swayze, Ava, DO   1 mg at 05/29/19 0934  . furosemide (LASIX) injection 40 mg  40 mg Intravenous Q12H Swayze, Ava, DO   40 mg at 05/29/19 0444  . insulin aspart (novoLOG) injection 0-9 Units  0-9 Units Subcutaneous TID WC Swayze, Ava, DO   3 Units at 05/28/19 1658  . lactulose (CHRONULAC) 10 GM/15ML solution 20 g  20 g Oral TID Swayze, Ava, DO   20 g at 05/29/19 0958  . mirtazapine (REMERON) tablet 30 mg  30 mg Oral QHS Swayze, Ava, DO   30 mg at 05/28/19 2202  . multivitamin with minerals tablet 1 tablet  1 tablet Oral Daily Swayze, Ava, DO   1 tablet at 05/29/19 0933  . senna-docusate (Senokot-S) tablet 1 tablet  1 tablet Oral QHS PRN Swayze, Ava, DO      . sodium bicarbonate 150 mEq in sterile water 1,000 mL infusion   Intravenous Continuous Sharion Settler, NP 50 mL/hr at 05/28/19 0217 New Bag at 05/28/19 0217  . traMADol (ULTRAM) tablet 50 mg  50 mg Oral Q6H PRN  Swayze, Ava, DO   50 mg at 05/29/19 0444     Abtx:  Anti-infectives (From admission, onward)   Start     Dose/Rate Route Frequency Ordered Stop   05/29/19 2200  doxycycline (VIBRA-TABS) tablet 100 mg     100 mg Oral Every 12 hours 05/29/19 0904     05/28/19 1000  cefTRIAXone (ROCEPHIN) 2 g in sodium chloride 0.9 % 100 mL IVPB     2 g 200 mL/hr over 30 Minutes Intravenous Every 24 hours 05/27/19 1510     05/27/19 0900  cefTRIAXone (ROCEPHIN) 2 g in sodium chloride 0.9 % 100 mL IVPB  Status:  Discontinued     2 g 200 mL/hr over 30 Minutes Intravenous Every 12 hours 05/27/19 0422 05/27/19 1510   05/27/19 0430  cefTRIAXone (ROCEPHIN) 2 g in sodium chloride 0.9 % 100 mL IVPB  Status:  Discontinued     2 g 200 mL/hr over 30 Minutes Intravenous Every 24 hours 05/27/19 0416 05/27/19 0417   05/27/19 0415  doxycycline (VIBRAMYCIN) 100 mg in sodium chloride 0.9 % 250 mL IVPB  Status:  Discontinued     100 mg 125 mL/hr over 120 Minutes Intravenous Every 12 hours 05/27/19 0416 05/29/19 0904   05/26/19 1800  metroNIDAZOLE (FLAGYL) IVPB 500 mg  Status:  Discontinued     500 mg 100 mL/hr over 60 Minutes Intravenous Every 8 hours 05/26/19 1649 05/27/19 0417   05/26/19 1700  cefTRIAXone (ROCEPHIN) 2 g in sodium chloride 0.9 % 100 mL IVPB  Status:  Discontinued     2 g 200 mL/hr over 30 Minutes Intravenous Every 24 hours 05/26/19 1649 05/27/19 0422   05/26/19 1545  vancomycin (VANCOREADY) IVPB 2000 mg/400 mL     2,000 mg 200 mL/hr over 120 Minutes Intravenous  Once 05/26/19 1539 05/26/19 1933   05/26/19 1545  vancomycin (  VANCOREADY) IVPB 500 mg/100 mL     500 mg 100 mL/hr over 60 Minutes Intravenous  Once 05/26/19 1539 05/26/19 2109      REVIEW OF SYSTEMS:  Const:  fever, chills, negative weight loss Eyes: negative diplopia or visual changes, negative eye pain ENT: negative coryza, negative sore throat Resp: negative cough, hemoptysis, has dyspnea Cards: negative for chest pain, palpitations,  positive lower extremity edema GU: negative for frequency, dysuria and hematuria GI: maroon colored stools Skin: as above Heme: negative for easy bruising and gum/nose bleeding BW:IOMBTDHRCBU pain, severe leg pain Neurolo:poor memory  Endocrine:  diabetes Allergy/Immunology-as above Objective:  VITALS:  BP 113/60 (BP Location: Right Arm)   Pulse 91   Temp 97.8 F (36.6 C) (Oral)   Resp 16   Ht 5' 5"  (1.651 m)   Wt 120.2 kg   SpO2 91%   BMI 44.10 kg/m  PHYSICAL EXAM:  General:chronically ill, lethargic  Head: Normocephalic, without obvious abnormality, atraumatic. Eyes: Conjunctivae clear, anicteric sclerae. Pupils are equal ENT Nares normal. No drainage or sinus tenderness. Lips, mucosa, and tongue normal. No Thrush Neck: Supple, symmetrical, no adenopathy, thyroid: non tender no carotid bruit and no JVD. Back: No CVA tenderness. Lungs: Clear to auscultation bilaterally. No Wheezing or Rhonchi. No rales. Heart: Regular rate and rhythm, systolic murmur, sternal scar Abdomen: Soft, non-tender,not distended. Bowel sounds normal. No masses Extremities: edema legs extending to the hip- rt > left Weeping skin over shins Venous pigmentation       Left great toe amputated Lymph: Cervical, supraclavicular normal. Neurologic: Grossly non-focal Pertinent Labs Lab Results CBC    Component Value Date/Time   WBC 8.9 05/27/2019 0530   RBC 3.37 (L) 05/27/2019 0530   HGB 7.3 (L) 05/27/2019 0530   HGB 7.3 (L) 11/23/2017 1454   HCT 25.5 (L) 05/27/2019 0530   HCT 45.7 08/03/2013 2138   PLT 152 05/27/2019 0530   PLT 118 (L) 08/03/2013 2138   MCV 75.7 (L) 05/27/2019 0530   MCV 89 08/03/2013 2138   MCH 21.7 (L) 05/27/2019 0530   MCHC 28.6 (L) 05/27/2019 0530   RDW 22.2 (H) 05/27/2019 0530   RDW 13.6 08/03/2013 2138   LYMPHSABS 0.1 (L) 05/27/2019 0530   LYMPHSABS 3.4 08/03/2013 2138   MONOABS 0.4 05/27/2019 0530   MONOABS 0.7 08/03/2013 2138   EOSABS 0.0 05/27/2019 0530     EOSABS 0.3 08/03/2013 2138   BASOSABS 0.1 05/27/2019 0530   BASOSABS 0.1 08/03/2013 2138    CMP Latest Ref Rng & Units 05/29/2019 05/28/2019 05/27/2019  Glucose 70 - 99 mg/dL 132(H) 197(H) 193(H)  BUN 8 - 23 mg/dL 46(H) 45(H) 32(H)  Creatinine 0.44 - 1.00 mg/dL 2.39(H) 2.28(H) 1.88(H)  Sodium 135 - 145 mmol/L 135 134(L) 130(L)  Potassium 3.5 - 5.1 mmol/L 4.7 4.6 4.7  Chloride 98 - 111 mmol/L 103 102 101  CO2 22 - 32 mmol/L 22 21(L) 20(L)  Calcium 8.9 - 10.3 mg/dL 8.0(L) 8.2(L) 8.1(L)  Total Protein 6.5 - 8.1 g/dL - - -  Total Bilirubin 0.3 - 1.2 mg/dL - - -  Alkaline Phos 38 - 126 U/L - - -  AST 15 - 41 U/L - - -  ALT 0 - 44 U/L - - -      Microbiology: Recent Results (from the past 240 hour(s))  Culture, blood (routine x 2)     Status: Abnormal   Collection Time: 05/26/19  2:57 PM   Specimen: BLOOD  Result Value Ref Range Status  Specimen Description   Final    BLOOD BLOOD LEFT FOREARM Performed at [facility], [address] [place], [place] [address]    Special Requests   Final    BOTTLES DRAWN AEROBIC AND ANAEROBIC Blood Culture results may not be optimal due to an inadequate volume of blood received in culture bottles Performed at [facility], [address] [place], [place] [address]    Culture  Setup Time   Final    GRAM POSITIVE COCCI IN BOTH AEROBIC AND ANAEROBIC BOTTLES CRITICAL RESULT CALLED TO, READ BACK BY AND VERIFIED WITH: DAVID BESANTI 05/27/19 AT 0237 HS    Culture (A)  Final    STREPTOCOCCUS GROUP C SUSCEPTIBILITIES PERFORMED ON PREVIOUS CULTURE WITHIN THE LAST 5 DAYS. Performed at [facility], [place] [address] [place], [place] [id number]    Report Status 05/29/2019 FINAL  Final  Culture, blood (routine x 2)     Status: Abnormal   Collection Time: 05/26/19  2:57 PM   Specimen: BLOOD  Result Value Ref Range Status   Specimen Description   Final    BLOOD LEFT ANTECUBITAL Performed at [facility], [address], [place], [place] [address]    Special Requests   Final    BOTTLES DRAWN AEROBIC AND ANAEROBIC Blood Culture adequate volume Performed at [facility], [address] [place], [place] [address]    Culture  Setup Time   Final    GRAM POSITIVE COCCI IN BOTH AEROBIC AND ANAEROBIC BOTTLES CRITICAL VALUE NOTED.  VALUE IS CONSISTENT WITH PREVIOUSLY REPORTED AND CALLED VALUE. Performed at [facility], [place], [place] [address]    Culture STREPTOCOCCUS GROUP C (A)  Final   Report Status 05/29/2019 FINAL  Final   Organism ID, Bacteria STREPTOCOCCUS GROUP C  Final      Susceptibility   Streptococcus group c - MIC*    CLINDAMYCIN <=0.25 SENSITIVE Sensitive     AMPICILLIN <=0.25 SENSITIVE Sensitive     ERYTHROMYCIN <=0.12 SENSITIVE Sensitive     VANCOMYCIN 0.25 SENSITIVE Sensitive     CEFTRIAXONE <=0.12 SENSITIVE Sensitive     LEVOFLOXACIN 0.5 SENSITIVE Sensitive     PENICILLIN Value in next row Sensitive      SENSITIVE<=0.06    * STREPTOCOCCUS GROUP C  Respiratory Panel by RT PCR (Flu A&B, Covid) - Nasopharyngeal Swab     Status: None   Collection Time: 05/26/19  2:57 PM   Specimen: Nasopharyngeal Swab  Result Value Ref Range Status   SARS Coronavirus 2 by RT PCR NEGATIVE NEGATIVE Final    Comment: (NOTE) SARS-CoV-2 target nucleic acids are NOT DETECTED. The SARS-CoV-2 RNA is generally detectable in upper respiratoy specimens during the acute phase of infection. The lowest concentration of SARS-CoV-2 viral copies this assay can detect is 131 copies/mL. A negative result does not preclude SARS-Cov-2 infection and should not be used as the sole basis for treatment or other patient management decisions. A negative result may occur with  improper specimen collection/handling, submission of specimen other than nasopharyngeal swab, presence of viral mutation(s) within the areas targeted by this assay, and inadequate number of viral copies (<131  copies/mL). A negative result must be combined with clinical observations, patient history, and epidemiological information. The expected result is Negative. Fact Sheet for Patients:  PinkCheek.be Fact Sheet for Healthcare Providers:  GravelBags.it This test is not yet ap proved or cleared by the [place] FDA and  has been  authorized for detection and/or diagnosis of SARS-CoV-2 by FDA under an Emergency Use Authorization (EUA). This EUA will remain  in effect (meaning this test can be used) for the duration of the COVID-19 declaration under Section 564(b)(1) of the Act, 21 U.S.C. section 360bbb-3(b)(1), unless the authorization is terminated or revoked sooner.    Influenza A by PCR NEGATIVE NEGATIVE Final   Influenza B by PCR NEGATIVE NEGATIVE Final    Comment: (NOTE) The Xpert Xpress SARS-CoV-2/FLU/RSV assay is intended as an aid in  the diagnosis of influenza from Nasopharyngeal swab specimens and  should not be used as a sole basis for treatment. Nasal washings and  aspirates are unacceptable for Xpert Xpress SARS-CoV-2/FLU/RSV  testing. Fact Sheet for Patients: PinkCheek.be Fact Sheet for Healthcare Providers: GravelBags.it This test is not yet approved or cleared by the [place] FDA and  has been authorized for detection and/or diagnosis of SARS-CoV-2 by  FDA under an Emergency Use Authorization (EUA). This EUA will remain  in effect (meaning this test can be used) for the duration of the  Covid-19 declaration under Section 564(b)(1) of the Act, 21  U.S.C. section 360bbb-3(b)(1), unless the authorization is  terminated or revoked. Performed at [facility], [address] [place], [place] [address]   Blood Culture ID Panel (Reflexed)     Status: Abnormal   Collection Time: 05/26/19  2:57 PM  Result Value Ref Range Status   Enterococcus  species NOT DETECTED NOT DETECTED Final   Listeria monocytogenes NOT DETECTED NOT DETECTED Final   Staphylococcus species NOT DETECTED NOT DETECTED Final   Staphylococcus aureus (BCID) NOT DETECTED NOT DETECTED Final   Streptococcus species DETECTED (A) NOT DETECTED Final    Comment: Not Enterococcus species, Streptococcus agalactiae, Streptococcus pyogenes, or Streptococcus pneumoniae. CRITICAL RESULT CALLED TO, READ BACK BY AND VERIFIED WITH: DAVID BESANTI 05/27/19 AT 6789 HS    Streptococcus agalactiae NOT DETECTED NOT DETECTED Final   Streptococcus pneumoniae NOT DETECTED NOT DETECTED Final   Streptococcus pyogenes NOT DETECTED NOT DETECTED Final   Acinetobacter baumannii NOT DETECTED NOT DETECTED Final   Enterobacteriaceae species NOT DETECTED NOT DETECTED Final   Enterobacter cloacae complex NOT DETECTED NOT DETECTED Final   Escherichia coli NOT DETECTED NOT DETECTED Final   Klebsiella oxytoca NOT DETECTED NOT DETECTED Final   Klebsiella pneumoniae NOT DETECTED NOT DETECTED Final   Proteus species NOT DETECTED NOT DETECTED Final   Serratia marcescens NOT DETECTED NOT DETECTED Final   Haemophilus influenzae NOT DETECTED NOT DETECTED Final   Neisseria meningitidis NOT DETECTED NOT DETECTED Final   Pseudomonas aeruginosa NOT DETECTED NOT DETECTED Final   Candida albicans NOT DETECTED NOT DETECTED Final   Candida glabrata NOT DETECTED NOT DETECTED Final   Candida krusei NOT DETECTED NOT DETECTED Final   Candida parapsilosis NOT DETECTED NOT DETECTED Final   Candida tropicalis NOT DETECTED NOT DETECTED Final    Comment: Performed at [facility], [address] [place], [place] [address]  Culture, blood (x 2)     Status: None (Preliminary result)   Collection Time: 05/27/19  6:54 PM   Specimen: BLOOD  Result Value Ref Range Status   Specimen Description BLOOD BLOOD RIGHT HAND  Final   Special Requests   Final    BOTTLES DRAWN AEROBIC AND ANAEROBIC Blood Culture adequate  volume   Culture   Final    NO GROWTH 2 DAYS Performed at [facility], [address] [place],  [address]  Report Status PENDING  Incomplete  Culture, blood (x 2)     Status: None (Preliminary result)   Collection Time: 05/27/19  6:56 PM   Specimen: BLOOD  Result Value Ref Range Status   Specimen Description BLOOD BLOOD LEFT HAND  Final   Special Requests   Final    BOTTLES DRAWN AEROBIC AND ANAEROBIC Blood Culture results may not be optimal due to an inadequate volume of blood received in culture bottles   Culture   Final    NO GROWTH 2 DAYS Performed at [facility], [address] [place], [place] [address]    Report Status PENDING  Incomplete    IMAGING RESULTS: CXR cardiomegaly with CHF I have personally reviewed the films ? Impression/Recommendation ? Group C streptococcus bacteremia : this is a skin/GI/oral organisms. She had same bacteremia last year. 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 she will need TEE to r/o endocarditis Change ceftriaxone to penicillin  CHF with  Severe edema legs Venous stasis , pigmentation and weeping No obvious infection  Anemia chronic with intermittent worsening- dut o GI loss because of AVM- multiple interventions in the past   CKD  Cirrhosis  Chronically ill, with frequent hospitalizations Followed by palliative Discussed the management with the care team    ? ? ____________________________________________ Note:  This document was prepared using Dragon voice recognition software and may include unintentional dictation errors.

## 2019-05-30 ENCOUNTER — Inpatient Hospital Stay
Admit: 2019-05-30 | Discharge: 2019-05-30 | Disposition: A | Discharge status: Home / Self Care | Payer: Medicaid Other | Attending: Cardiology | Admitting: Cardiology

## 2019-05-30 ENCOUNTER — Inpatient Hospital Stay: Payer: Self-pay

## 2019-05-30 ENCOUNTER — Encounter
Admission: EM | Disposition: A | Discharge status: Skilled Nursing Facility | Payer: Self-pay | Source: Home / Self Care | Attending: Internal Medicine

## 2019-05-30 ENCOUNTER — Encounter: Payer: Self-pay | Admitting: Internal Medicine

## 2019-05-30 DIAGNOSIS — A409 Streptococcal sepsis, unspecified: Secondary | ICD-10-CM

## 2019-05-30 DIAGNOSIS — D649 Anemia, unspecified: Secondary | ICD-10-CM

## 2019-05-30 DIAGNOSIS — Z7189 Other specified counseling: Secondary | ICD-10-CM

## 2019-05-30 DIAGNOSIS — L89220 Pressure ulcer of left hip, unstageable: Secondary | ICD-10-CM

## 2019-05-30 HISTORY — PX: TEE WITHOUT CARDIOVERSION: SHX5443

## 2019-05-30 LAB — PROTIME-INR
INR: 1.6 — ABNORMAL HIGH (ref 0.8–1.2)
Prothrombin Time: 18.2 seconds — ABNORMAL HIGH (ref 11.4–15.2)

## 2019-05-30 LAB — CBC
HCT: 23.8 % — ABNORMAL LOW (ref 36.0–46.0)
HCT: 23.8 % — ABNORMAL LOW (ref 36.0–46.0)
Hemoglobin: 6.7 g/dL — ABNORMAL LOW (ref 12.0–15.0)
Hemoglobin: 6.8 g/dL — ABNORMAL LOW (ref 12.0–15.0)
MCH: 21.1 pg — ABNORMAL LOW (ref 26.0–34.0)
MCH: 21.3 pg — ABNORMAL LOW (ref 26.0–34.0)
MCHC: 28.2 g/dL — ABNORMAL LOW (ref 30.0–36.0)
MCHC: 28.6 g/dL — ABNORMAL LOW (ref 30.0–36.0)
MCV: 74.8 fL — ABNORMAL LOW (ref 80.0–100.0)
MCV: 74.6 fL — ABNORMAL LOW (ref 80.0–100.0)
Platelets: 193 10*3/uL (ref 150–400)
Platelets: 189 10*3/uL (ref 150–400)
RBC: 3.18 MIL/uL — ABNORMAL LOW (ref 3.87–5.11)
RBC: 3.19 MIL/uL — ABNORMAL LOW (ref 3.87–5.11)
RDW: 22.8 % — ABNORMAL HIGH (ref 11.5–15.5)
RDW: 22.5 % — ABNORMAL HIGH (ref 11.5–15.5)
WBC: 18.5 10*3/uL — ABNORMAL HIGH (ref 4.0–10.5)
WBC: 19.6 10*3/uL — ABNORMAL HIGH (ref 4.0–10.5)
nRBC: 0.4 % — ABNORMAL HIGH (ref 0.0–0.2)
nRBC: 0.4 % — ABNORMAL HIGH (ref 0.0–0.2)

## 2019-05-30 LAB — BASIC METABOLIC PANEL
Anion gap: 10 (ref 5–15)
BUN: 52 mg/dL — ABNORMAL HIGH (ref 8–23)
CO2: 23 mmol/L (ref 22–32)
Calcium: 7.8 mg/dL — ABNORMAL LOW (ref 8.9–10.3)
Chloride: 103 mmol/L (ref 98–111)
Creatinine, Ser: 2.37 mg/dL — ABNORMAL HIGH (ref 0.44–1.00)
GFR calc Af Amer: 24 mL/min — ABNORMAL LOW (ref >60)
GFR calc non Af Amer: 21 mL/min — ABNORMAL LOW (ref >60)
Glucose, Bld: 169 mg/dL — ABNORMAL HIGH (ref 70–99)
Potassium: 4.7 mmol/L (ref 3.5–5.1)
Sodium: 136 mmol/L (ref 135–145)

## 2019-05-30 LAB — AMMONIA: Ammonia: 17 umol/L (ref 9–35)

## 2019-05-30 LAB — GLUCOSE, CAPILLARY
Glucose-Capillary: 147 mg/dL — ABNORMAL HIGH (ref 70–99)
Glucose-Capillary: 148 mg/dL — ABNORMAL HIGH (ref 70–99)
Glucose-Capillary: 164 mg/dL — ABNORMAL HIGH (ref 70–99)
Glucose-Capillary: 195 mg/dL — ABNORMAL HIGH (ref 70–99)

## 2019-05-30 LAB — PREPARE RBC (CROSSMATCH)

## 2019-05-30 SURGERY — ECHOCARDIOGRAM, TRANSESOPHAGEAL
Anesthesia: Moderate Sedation

## 2019-05-30 MED ORDER — BUTAMBEN-TETRACAINE-BENZOCAINE 2-2-14 % EX AERO
INHALATION_SPRAY | CUTANEOUS | Status: AC
  Filled 2019-05-30: qty 5

## 2019-05-30 MED ORDER — MIDAZOLAM HCL 5 MG/5ML IJ SOLN
INTRAMUSCULAR | Status: AC | PRN
  Administered 2019-05-30: 2 mg via INTRAVENOUS

## 2019-05-30 MED ORDER — ENOXAPARIN SODIUM 40 MG/0.4ML ~~LOC~~ SOLN
40.0000 mg | Freq: Two times a day (BID) | SUBCUTANEOUS | Status: DC
  Administered 2019-05-30 – 2019-06-02 (×6): 40 mg via SUBCUTANEOUS
  Filled 2019-05-30 (×6): qty 0.4

## 2019-05-30 MED ORDER — MIDAZOLAM HCL 2 MG/2ML IJ SOLN
INTRAMUSCULAR | Status: AC
  Filled 2019-05-30: qty 4

## 2019-05-30 MED ORDER — LIDOCAINE VISCOUS HCL 2 % MT SOLN
OROMUCOSAL | Status: AC
  Filled 2019-05-30: qty 15

## 2019-05-30 MED ORDER — FENTANYL CITRATE (PF) 100 MCG/2ML IJ SOLN
INTRAMUSCULAR | Status: AC
  Filled 2019-05-30: qty 2

## 2019-05-30 MED ORDER — SODIUM CHLORIDE 0.9% IV SOLUTION: Freq: Once | INTRAVENOUS | Status: DC

## 2019-05-30 MED ORDER — FENTANYL CITRATE (PF) 100 MCG/2ML IJ SOLN
INTRAMUSCULAR | Status: AC | PRN
  Administered 2019-05-30: 50 ug via INTRAVENOUS

## 2019-05-30 MED ORDER — SODIUM CHLORIDE FLUSH 0.9 % IV SOLN
INTRAVENOUS | Status: AC
  Filled 2019-05-30: qty 10

## 2019-05-30 MED ORDER — ACETAMINOPHEN 325 MG PO TABS
650.0000 mg | ORAL_TABLET | Freq: Once | ORAL | Status: AC
  Administered 2019-05-30: 650 mg via ORAL
  Filled 2019-05-30: qty 2

## 2019-05-30 NOTE — Progress Notes (Incomplete)
? ?  Date of Admission:  05/26/2019   Total days of antibiotics *** ?       Day *** ?       Day *** ?       Day *** ? ? ?ID: [name] is a 64 y.o. female with  *** ?Active Problems: ?  Sepsis ([place]) ?  COPD (chronic obstructive pulmonary disease) ([place]) ?  Acute on chronic diastolic CHF (congestive heart failure) ([place]) ?  Diabetes mellitus type 2, uncontrolled ([place]) ?  Diabetic polyneuropathy associated with type 2 diabetes mellitus ([place]) ?  Impaired renal function ?  Obesity ?  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  Sleep apnea ?  Altered mental status ?  CKD stage 3 due to type 2 diabetes mellitus ([place]) ?  Anasarca ?  Cellulitis ?  Diabetic foot ulcer ([place]) ?  Decreased level of consciousness ?  Goals of care, counseling/discussion ?  Palliative care by specialist ?  Acute metabolic encephalopathy ?  Hypotension ?  Acute kidney injury superimposed on CKD ([place]) ?  DNR (do not resuscitate) discussion ? ? ? ?Subjective: ?*** ? ?Medications:  ?? sodium chloride   Intravenous Once  ?? ammonium lactate  1 application Topical BID  ?? vitamin C  250 mg Oral BID  ?? atorvastatin  40 mg Oral Daily  ?? enoxaparin (LOVENOX) injection  40 mg Subcutaneous Q12H  ?? feeding supplement (ENSURE ENLIVE)  237 mL Oral BID  ?? feeding supplement (PRO-STAT SUGAR FREE 64)  30 mL Oral BID  ?? fentaNYL      ?? ferrous sulfate  325 mg Oral BID WC  ?? fluticasone  2 spray Each Nare Daily  ?? folic acid  1 mg Oral Daily  ?? insulin aspart  0-9 Units Subcutaneous TID WC  ?? lactulose  20 g Oral TID  ?? midazolam      ?? mirtazapine  30 mg Oral QHS  ?? multivitamin with minerals  1 tablet Oral Daily  ?? sodium chloride flush      ? ? ?Objective: ?Vital signs in last 24 hours: ?Temp:  [98.2 ?F (36.8 ?C)-98.9 ?F (37.2 ?C)] 98.4 ?F (36.9 ?C) (05/13 1447) ?Pulse Rate:  [65-86] 65 (05/13 1447) ?Resp:  [10-20] 17 (05/13 1447) ?BP: (105-130)/(55-64) 119/64 (05/13 1447) ?SpO2:  [90 %-100 %] 97 % (05/13 1447) ?Weight:  [120 kg] 120 kg (05/13 0420) ? ? PHYSICAL EXAM:  ?General: Alert, cooperative, no di

## 2019-05-30 NOTE — Progress Notes (Signed)
Patient CBC levels seems to be all off. Provider made aware. This AM patient continues to have generalized edema. Nursing has also notice leaking around patients IV site. Leaking is effecting infusion. At this point I have stopped infusion. And placed a consult for the IV team to either assess of place new IV. Will pass this on to the oncoming RN as well.

## 2019-05-30 NOTE — Progress Notes (Signed)
Spoke with dr. Leslye Peer to make aware patient does not qualify for PICC or midline due to renal function per IV team. I was able to get two 24 gauge iv's and iv team was able to get one 20 gauge. md is also aware of patient hemoglobin of 6.8. per md give 1 unit of PRBC after patient is done with TEE. Will continue to monitor closely

## 2019-05-30 NOTE — Progress Notes (Signed)
*  PRELIMINARY RESULTS* Echocardiogram Echocardiogram Transesophageal has been performed.  [name] 05/30/2019, 1:25 PM

## 2019-05-30 NOTE — Consult Note (Signed)
PHARMACIST - PHYSICIAN COMMUNICATION  CONCERNING:  Enoxaparin (Lovenox) for DVT Prophylaxis    RECOMMENDATION: Patient was prescribed enoxaprin 71m q24 hours for VTE prophylaxis.   Filed Weights   05/28/19 0442 05/29/19 0431 05/30/19 0420  Weight: 117.2 kg (258 lb 4.8 oz) 120.2 kg (265 lb) 120 kg (264 lb 8.8 oz)    Body mass index is 44.02 kg/m.  Estimated Creatinine Clearance: 31.1 mL/min (A) (by C-G formula based on SCr of 2.37 mg/dL (H)).   Based on [place] for enoxaparin 471mevery 12 hour dosing due to BMI being >40. Scr trending down.    DESCRIPTION: [facility]  Patient is now receiving enoxaparin 4067mvery 12hours.    KisOswald HillockharmD Clinical Pharmacist  05/30/2019 10:56 AM

## 2019-05-30 NOTE — Progress Notes (Signed)
PT Cancellation Note  Patient Details Name: [name] MRN: [id number] DOB: 1955/09/12   Cancelled Treatment:    Reason Eval/Treat Not Completed: (Per continued chart review, patient noted with decreased HgB (6.8); pending transfusion.  Also scheduled for TEE this date; currently NPO.  Will hold evaluation at this time until transfusion complete; will re-attempt at later time/date as medically appropriate.)   Damonie Ellenwood H. Owens Shark, PT, DPT, NCS 05/30/19, 9:54 AM [phone]

## 2019-05-30 NOTE — Progress Notes (Signed)
Patient ID: [name], female   DOB: 1955/02/21, 64 y.o.   MRN: [id number] [facility] PROGRESS NOTE  [name] KTG:[id number] DOB: [birth year] DOA: 05/26/2019 PCP: [place]  HPI/Subjective: Patient feels tired and weak.  She is agreeable to go out to rehab.  No complaints of shortness of breath.  Still having lots of pain in her legs.  Objective: Vitals:   05/30/19 1330 05/30/19 1345  BP: 115/61 (!) 105/58  Pulse: 83 82  Resp: 10 11  Temp:    SpO2: 100% 90%    Intake/Output Summary (Last 24 hours) at 05/30/2019 1405 Last data filed at 05/30/2019 1156 Gross per 24 hour  Intake 738.75 ml  Output 2600 ml  Net -1861.25 ml   Filed Weights   05/28/19 0442 05/29/19 0431 05/30/19 0420  Weight: 117.2 kg 120.2 kg 120 kg    ROS: Review of Systems  Constitutional: Negative for fever.  Eyes: Negative for blurred vision.  Respiratory: Negative for cough and shortness of breath.   Cardiovascular: Negative for chest pain.  Gastrointestinal: Negative for abdominal pain, nausea and vomiting.  Genitourinary: Negative for dysuria.  Musculoskeletal: Positive for joint pain.  Neurological: Negative for dizziness.   Exam: Physical Exam  HENT:  Nose: No mucosal edema.  Mouth/Throat: No oropharyngeal exudate or posterior oropharyngeal edema.  Eyes: Conjunctivae and lids are normal.  Neck: Carotid bruit is not present.  Cardiovascular: S1 normal and S2 normal. Exam reveals no gallop.  No murmur heard. Respiratory: No respiratory distress. She has decreased breath sounds in the right lower field and the left lower field. She has no wheezes. She has no rhonchi. She has no rales.  GI: Soft. Bowel sounds are normal. There is no abdominal tenderness.  Musculoskeletal:     Right ankle: Swelling present.     Left ankle: Swelling present.  Lymphadenopathy:    She has no cervical adenopathy.  Neurological: She is alert.  Patient unable to lift her legs up off the  bed  Skin: Skin is warm. No rash noted. Nails show no clubbing.  Left shin a couple areas of skin breakdown with no drainage Right shin larger area of skin breakdown with slight greenish drainage  Psychiatric: She has a normal mood and affect.      Data Reviewed: Basic Metabolic Panel: Recent Labs  Lab 05/26/19 1457 05/27/19 0530 05/28/19 0607 05/29/19 0422 05/30/19 0437  NA 130* 130* 134* 135 136  K 4.7 4.7 4.6 4.7 4.7  CL 103 101 102 103 103  CO2 18* 20* 21* 22 23  GLUCOSE 314* 193* 197* 132* 169*  BUN 28* 32* 45* 46* 52*  CREATININE 1.88* 1.88* 2.28* 2.39* 2.37*  CALCIUM 8.3* 8.1* 8.2* 8.0* 7.8*   CBC: Recent Labs  Lab 05/26/19 1457 05/27/19 0530 05/30/19 0437 05/30/19 0535  WBC 6.9 8.9 18.5* 19.6*  NEUTROABS 6.5 8.3*  --   --   HGB 7.8* 7.3* 6.7* 6.8*  HCT 27.1* 25.5* 23.8* 23.8*  MCV 75.9* 75.7* 74.8* 74.6*  PLT 179 152 193 189   Cardiac Enzymes: Recent Labs  Lab 05/26/19 1457  CKTOTAL 162   BNP (last 3 results) Recent Labs    08/17/18 1030 05/04/19 1133 05/26/19 1457  BNP 861.0* 778.0* 827.0*    CBG: Recent Labs  Lab 05/29/19 1117 05/29/19 1615 05/29/19 2101 05/30/19 0746 05/30/19 1207  GLUCAP 151* 152* 157* 147* 148*    Recent Results (from the past 240 hour(s))  Culture, blood (routine x 2)  Status: Abnormal   Collection Time: 05/26/19  2:57 PM   Specimen: BLOOD  Result Value Ref Range Status   Specimen Description   Final    BLOOD BLOOD LEFT FOREARM Performed at [facility], [address] [place], [place] [address]    Special Requests   Final    BOTTLES DRAWN AEROBIC AND ANAEROBIC Blood Culture results may not be optimal due to an inadequate volume of blood received in culture bottles Performed at [facility], [address], [place], [place] [address]    Culture  Setup Time   Final    GRAM POSITIVE COCCI IN BOTH AEROBIC AND ANAEROBIC BOTTLES CRITICAL RESULT CALLED TO, READ BACK BY AND VERIFIED  WITH: DAVID BESANTI 05/27/19 AT 0237 HS    Culture (A)  Final    STREPTOCOCCUS GROUP C SUSCEPTIBILITIES PERFORMED ON PREVIOUS CULTURE WITHIN THE LAST 5 DAYS. Performed at [facility], [place] [address], [place], [place] [address]    Report Status 05/29/2019 FINAL  Final  Culture, blood (routine x 2)     Status: Abnormal   Collection Time: 05/26/19  2:57 PM   Specimen: BLOOD  Result Value Ref Range Status   Specimen Description   Final    BLOOD LEFT ANTECUBITAL Performed at [facility], [address], [place], [place] [address]    Special Requests   Final    BOTTLES DRAWN AEROBIC AND ANAEROBIC Blood Culture adequate volume Performed at [facility], [address] [place], [place] [address]    Culture  Setup Time   Final    GRAM POSITIVE COCCI IN BOTH AEROBIC AND ANAEROBIC BOTTLES CRITICAL VALUE NOTED.  VALUE IS CONSISTENT WITH PREVIOUSLY REPORTED AND CALLED VALUE. Performed at [facility], [place], [place] [address]    Culture STREPTOCOCCUS GROUP C (A)  Final   Report Status 05/29/2019 FINAL  Final   Organism ID, Bacteria STREPTOCOCCUS GROUP C  Final      Susceptibility   Streptococcus group c - MIC*    CLINDAMYCIN <=0.25 SENSITIVE Sensitive     AMPICILLIN <=0.25 SENSITIVE Sensitive     ERYTHROMYCIN <=0.12 SENSITIVE Sensitive     VANCOMYCIN 0.25 SENSITIVE Sensitive     CEFTRIAXONE <=0.12 SENSITIVE Sensitive     LEVOFLOXACIN 0.5 SENSITIVE Sensitive     PENICILLIN Value in next row Sensitive      SENSITIVE<=0.06    * STREPTOCOCCUS GROUP C  Respiratory Panel by RT PCR (Flu A&B, Covid) - Nasopharyngeal Swab     Status: None   Collection Time: 05/26/19  2:57 PM   Specimen: Nasopharyngeal Swab  Result Value Ref Range Status   SARS Coronavirus 2 by RT PCR NEGATIVE NEGATIVE Final    Comment: (NOTE) SARS-CoV-2 target nucleic acids are NOT DETECTED. The SARS-CoV-2 RNA is generally detectable in upper respiratoy specimens  during the acute phase of infection. The lowest concentration of SARS-CoV-2 viral copies this assay can detect is 131 copies/mL. A negative result does not preclude SARS-Cov-2 infection and should not be used as the sole basis for treatment or other patient management decisions. A negative result may occur with  improper specimen collection/handling, submission of specimen other than nasopharyngeal swab, presence of viral mutation(s) within the areas targeted by this assay, and inadequate number of viral copies (<131 copies/mL). A negative result must be combined with clinical observations, patient history, and epidemiological information. The expected result is Negative. Fact Sheet for Patients:  PinkCheek.be Fact Sheet for  Healthcare Providers:  GravelBags.it This test is not yet ap proved or cleared by the [place] and  has been authorized for detection and/or diagnosis of SARS-CoV-2 by FDA under an Emergency Use Authorization (EUA). This EUA will remain  in effect (meaning this test can be used) for the duration of the COVID-19 declaration under Section 564(b)(1) of the Act, 21 U.S.C. section 360bbb-3(b)(1), unless the authorization is terminated or revoked sooner.    Influenza A by PCR NEGATIVE NEGATIVE Final   Influenza B by PCR NEGATIVE NEGATIVE Final    Comment: (NOTE) The Xpert Xpress SARS-CoV-2/FLU/RSV assay is intended as an aid in  the diagnosis of influenza from Nasopharyngeal swab specimens and  should not be used as a sole basis for treatment. Nasal washings and  aspirates are unacceptable for Xpert Xpress SARS-CoV-2/FLU/RSV  testing. Fact Sheet for Patients: PinkCheek.be Fact Sheet for Healthcare Providers: GravelBags.it This test is not yet approved or cleared by the [place] FDA and  has been authorized for detection and/or diagnosis of  SARS-CoV-2 by  FDA under an Emergency Use Authorization (EUA). This EUA will remain  in effect (meaning this test can be used) for the duration of the  Covid-19 declaration under Section 564(b)(1) of the Act, 21  U.S.C. section 360bbb-3(b)(1), unless the authorization is  terminated or revoked. Performed at [facility], [address] [place], [place] [address]   Blood Culture ID Panel (Reflexed)     Status: Abnormal   Collection Time: 05/26/19  2:57 PM  Result Value Ref Range Status   Enterococcus species NOT DETECTED NOT DETECTED Final   Listeria monocytogenes NOT DETECTED NOT DETECTED Final   Staphylococcus species NOT DETECTED NOT DETECTED Final   Staphylococcus aureus (BCID) NOT DETECTED NOT DETECTED Final   Streptococcus species DETECTED (A) NOT DETECTED Final    Comment: Not Enterococcus species, Streptococcus agalactiae, Streptococcus pyogenes, or Streptococcus pneumoniae. CRITICAL RESULT CALLED TO, READ BACK BY AND VERIFIED WITH: DAVID BESANTI 05/27/19 AT 9038 HS    Streptococcus agalactiae NOT DETECTED NOT DETECTED Final   Streptococcus pneumoniae NOT DETECTED NOT DETECTED Final   Streptococcus pyogenes NOT DETECTED NOT DETECTED Final   Acinetobacter baumannii NOT DETECTED NOT DETECTED Final   Enterobacteriaceae species NOT DETECTED NOT DETECTED Final   Enterobacter cloacae complex NOT DETECTED NOT DETECTED Final   Escherichia coli NOT DETECTED NOT DETECTED Final   Klebsiella oxytoca NOT DETECTED NOT DETECTED Final   Klebsiella pneumoniae NOT DETECTED NOT DETECTED Final   Proteus species NOT DETECTED NOT DETECTED Final   Serratia marcescens NOT DETECTED NOT DETECTED Final   Haemophilus influenzae NOT DETECTED NOT DETECTED Final   Neisseria meningitidis NOT DETECTED NOT DETECTED Final   Pseudomonas aeruginosa NOT DETECTED NOT DETECTED Final   Candida albicans NOT DETECTED NOT DETECTED Final   Candida glabrata NOT DETECTED NOT DETECTED Final   Candida krusei  NOT DETECTED NOT DETECTED Final   Candida parapsilosis NOT DETECTED NOT DETECTED Final   Candida tropicalis NOT DETECTED NOT DETECTED Final    Comment: Performed at [facility], [address] [place], [place] [address]  Culture, blood (x 2)     Status: None (Preliminary result)   Collection Time: 05/27/19  6:54 PM   Specimen: BLOOD  Result Value Ref Range Status   Specimen Description BLOOD BLOOD RIGHT HAND  Final   Special Requests   Final    BOTTLES DRAWN AEROBIC AND ANAEROBIC Blood Culture adequate volume   Culture   Final  NO GROWTH 3 DAYS Performed at [facility], [address] [place], [place] [address]    Report Status PENDING  Incomplete  Culture, blood (x 2)     Status: None (Preliminary result)   Collection Time: 05/27/19  6:56 PM   Specimen: BLOOD  Result Value Ref Range Status   Specimen Description BLOOD BLOOD LEFT HAND  Final   Special Requests   Final    BOTTLES DRAWN AEROBIC AND ANAEROBIC Blood Culture results may not be optimal due to an inadequate volume of blood received in culture bottles   Culture   Final    NO GROWTH 3 DAYS Performed at [facility], [address] [place], [place] [address]    Report Status PENDING  Incomplete     Scheduled Meds: . [MAR Hold] sodium chloride   Intravenous Once  . [MAR Hold] acetaminophen  650 mg Oral Once  . [MAR Hold] ammonium lactate  1 application Topical BID  . [MAR Hold] vitamin C  250 mg Oral BID  . [MAR Hold] atorvastatin  40 mg Oral Daily  . [MAR Hold] enoxaparin (LOVENOX) injection  40 mg Subcutaneous Q12H  . [MAR Hold] feeding supplement (ENSURE ENLIVE)  237 mL Oral BID  . [MAR Hold] feeding supplement (PRO-STAT SUGAR FREE 64)  30 mL Oral BID  . fentaNYL      . [MAR Hold] ferrous sulfate  325 mg Oral BID WC  . [MAR Hold] fluticasone  2 spray Each Nare Daily  . [MAR Hold] folic acid  1 mg Oral Daily  . [MAR Hold] insulin aspart  0-9 Units Subcutaneous TID WC  . [MAR  Hold] lactulose  20 g Oral TID  . midazolam      . [MAR Hold] mirtazapine  30 mg Oral QHS  . [MAR Hold] multivitamin with minerals  1 tablet Oral Daily  . sodium chloride flush       Continuous Infusions: . sodium chloride Stopped (05/30/19 0653)  . [facility] Hold] penicillin g continuous IV infusion Stopped (05/30/19 0654)    Assessment/Plan:  1. Sepsis with group C streptococcus with acute kidney injury.  Present on admission.  Case discussed with infectious disease and they recommended continuous penicillin G infusion.  TEE negative for vegetations.  Patient will likely be switched over to Rocephin for long course treatment.  Patient will need a PICC line.  Repeat blood cultures negative. 2. Anemia of chronic disease.  Hemoglobin dropped down to 6.8.  Transfuse 1 unit of packed red blood cells today and recheck hemoglobin tomorrow. 3. Acute metabolic encephalopathy.  Ammonia level only 17.  Patient on lactulose. 4. Hypotension.  Blood pressure better off antihypertensive medication 5. Acute kidney injury on chronic kidney disease stage IIIb.  Creatinine stabilized at 2.37 hopefully we will start seeing a trend better starting tomorrow. 6. Pressure injury distal thigh, present on admission please see description below. 7. Bilateral lower extremity wounds.  Continue antibiotics 8. 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  Patient on sliding scale insulin 9. Hyperlipidemia on Lipitor 10. Immobility  Pressure Injury 05/05/19 Thigh Distal;Left;Posterior Unstageable - Full thickness tissue loss in which the base of the injury is covered by slough (yellow, tan, gray, green or brown) and/or eschar (tan, brown or black) in the wound bed. 4"x4" area with m (Active)  05/05/19 0700  Location: Thigh  Location Orientation: Distal;Left;Posterior  Staging: Unstageable - Full thickness tissue loss in which the base of the injury is covered  by slough (yellow, tan, gray, green or  brown) and/or eschar (tan, brown or black) in the wound bed.  Wound Description (Comments): 4"x4" area with mixture of pink granulated wound bed and yellow slough.  Present on Admission: Yes       Code Status:     Code Status Orders  (From admission, onward)         Start     Ordered   05/28/19 1030  Full code  Continuous     05/28/19 1029        Code Status History    Date Active Date Inactive Code Status Order ID Comments User Context   05/04/2019 1534 05/10/2019 2332 Full Code [id number]  Debbe Odea, MD ED   02/23/2019 2229 02/27/2019 1835 Full Code [id number]  Lenore Cordia, MD ED   12/16/2018 1704 01/02/2019 1845 Full Code [id number]  Eugenie Filler, MD ED   11/20/2018 1812 12/08/2018 0528 Full Code [id number]  Modena Nunnery D, DO ED   10/26/2018 0048 11/08/2018 0023 Full Code [id number]  Lang Snow, NP ED   08/17/2018 1820 08/22/2018 1805 Full Code [id number]  Dustin Flock, MD Inpatient   06/20/2018 2312 06/27/2018 2119 Full Code [id number]  Mayer Camel, NP ED   01/13/2018 1540 01/16/2018 1859 Full Code [id number]  Loletha Grayer, MD ED   11/03/2017 1758 11/06/2017 2136 Full Code [id number]  Salary, Avel Peace, MD Inpatient   09/26/2017 2353 09/30/2017 1404 Full Code [id number]  Lance Coon, MD Inpatient   08/27/2017 0347 08/29/2017 2054 Full Code [id number]  Harrie Foreman, MD Inpatient   08/07/2017 1704 08/09/2017 2237 Full Code [id number]  Saundra Shelling, MD Inpatient   12/18/2016 1146 12/26/2016 2032 Full Code [id number]  Vaughan Basta, MD Inpatient   12/18/2016 0959 12/18/2016 1146 Full Code [id number]  Vaughan Basta, MD ED   12/04/2015 0509 12/07/2015 1756 Full Code [id number]  Holley Raring, NP ED   10/28/2015 1640 10/29/2015 1711 Full Code [id number]  Bettey Costa, MD Inpatient   08/29/2015 1438 08/30/2015 1750 Full Code [id number]  Hugelmeyer, Ubaldo Glassing, DO Inpatient   Advance Care Planning Activity     Family Communication: I do not have a  good phone number for the patient's husband. Disposition Plan: Status is: Inpatient  Dispo: The patient is from: Home              Anticipated d/c is to: Patient agreeable to go to rehab              Anticipated d/c date is: Depending on progress may be 06/03/2019.              Patient currently placed on continuous IV antibiotic drip by infectious disease.  Patient will need a PICC line and long-term IV antibiotics.  Overall prognosis is poor with immobility, leg wounds and decubiti and repeated infection  Consultants:  Infectious disease  Palliative care  Antibiotics:  Continuous penicillin G infusion  Time spent: 27 minutes, case discussed with transitional care team, cardiology and infectious disease.  Case discussed with nursing staff  Loletha Grayer  [facility]

## 2019-05-30 NOTE — Procedures (Signed)
   TRANSESOPHAGEAL ECHOCARDIOGRAM   NAME:  [name]   MRN: [id number] DOB:  [birth year]   ADMIT DATE: 05/26/2019  INDICATIONS:   PROCEDURE:   Informed consent was obtained prior to the procedure. The risks, benefits and alternatives for the procedure were discussed and the patient comprehended these risks.  Risks include, but are not limited to, cough, sore throat, vomiting, nausea, somnolence, esophageal and stomach trauma or perforation, bleeding, low blood pressure, aspiration, pneumonia, infection, trauma to the teeth and death.    After a procedural time-out, the patient was given 2 mg versed and 50 mcg fentanyl to achieve moderate sedation for 12 min.  The oropharynx was anesthetized 3 cc of topical 1% viscous lidocaine.  The transesophageal probe was inserted in the esophagus and stomach without difficulty and multiple views were obtained. I was present for the entire procedure.    COMPLICATIONS:    There were no immediate complications.  FINDINGS:  LEFT VENTRICLE: EF = 45%. No regional wall motion abnormalities.  RIGHT VENTRICLE: Normal size and function.   LEFT ATRIUM: Mildly dilated  LEFT ATRIAL APPENDAGE: No thrombus.   RIGHT ATRIUM: Mildly dilated  AORTIC VALVE:  [place]. Trvial ai, no vegetations  MITRAL VALVE:    Normal. Trivial to mild mr, no vegetations  TRICUSPID VALVE: Normal.Mild tr, no vegetations  PULMONIC VALVE: Grossly normal.  INTERATRIAL SEPTUM: No PFO or ASD. Agitated saline contrast was used.   PERICARDIUM: No effusion  DESCENDING AORTA: normal  CONCLUSION: No vegetations noted.

## 2019-05-30 NOTE — Progress Notes (Addendum)
Palliative: [name], [place], is resting quietly in bed.  She is sleeping soundly, but wakes relatively easily when I call her name.  She appears acutely/chronically ill, morbidly obese.  She is alert and oriented to person and place.  She is able to make her basic needs known.  There is no family at bedside at this time.  I asked Cyd what is brought her into the hospital, and she mumbles that she was "tired and weak".  I share that she has blood infection, bacteremia.  Dejha tells me that she knew this.  We talked briefly about the treatment plan.  We talked again today about CODE STATUS.  Arlenne tells me that she would want life support.  She is unable to set limits for time.  She tells me that she would accept a trach if needed, PEG tube to feed her, further amputations if needed.  She tells me that she would be agreeable to living the rest of her life in a nursing home if it meant she were alive.  She expresses to me that longevity is more important than quality.  Azie even tells me that she would be okay with life support/extraordinary measures even if she were unable to make her basic needs known/speak.  I asked May if she has a phone number for her husband Kela Millin.  She tells me that she does, but she does not know it by memory.  I shared with Lorenzo that we are doing what we can to help her get better.   Conference with attending, bedside nursing staff, transition of care team related to patient condition, needs, goals of care/CODE STATUS discussions.  Plan: Continue to treat the treatable.  Full scope/full code.  Agreeable to any and all interventions to prolong life.  At this point agreeable to rehab if qualified.  40 minutes Quinn Axe, NP Palliative Medicine Team Team Phone # [phone] Greater than 50% of this time was spent counseling and coordinating care related to the above assessment and plan.

## 2019-05-30 NOTE — Progress Notes (Signed)
Spoke with Patent examiner re: PICC order, will not going to be done today since patient is groggy from TEE procedure this afternoon. Plan is to discharged patient in rehab with PICC but no d/c date yet. Patient has 3 working PIVs. Will be done tomorrow.

## 2019-05-31 ENCOUNTER — Inpatient Hospital Stay: Payer: Medicaid Other

## 2019-05-31 DIAGNOSIS — D638 Anemia in other chronic diseases classified elsewhere: Secondary | ICD-10-CM

## 2019-05-31 LAB — BASIC METABOLIC PANEL
Anion gap: 9 (ref 5–15)
BUN: 51 mg/dL — ABNORMAL HIGH (ref 8–23)
CO2: 24 mmol/L (ref 22–32)
Calcium: 8.1 mg/dL — ABNORMAL LOW (ref 8.9–10.3)
Chloride: 103 mmol/L (ref 98–111)
Creatinine, Ser: 2.14 mg/dL — ABNORMAL HIGH (ref 0.44–1.00)
GFR calc Af Amer: 27 mL/min — ABNORMAL LOW (ref >60)
GFR calc non Af Amer: 24 mL/min — ABNORMAL LOW (ref >60)
Glucose, Bld: 218 mg/dL — ABNORMAL HIGH (ref 70–99)
Potassium: 4.7 mmol/L (ref 3.5–5.1)
Sodium: 136 mmol/L (ref 135–145)

## 2019-05-31 LAB — GLUCOSE, CAPILLARY
Glucose-Capillary: 208 mg/dL — ABNORMAL HIGH (ref 70–99)
Glucose-Capillary: 193 mg/dL — ABNORMAL HIGH (ref 70–99)
Glucose-Capillary: 189 mg/dL — ABNORMAL HIGH (ref 70–99)
Glucose-Capillary: 163 mg/dL — ABNORMAL HIGH (ref 70–99)
Glucose-Capillary: 179 mg/dL — ABNORMAL HIGH (ref 70–99)

## 2019-05-31 LAB — CBC
HCT: 29.5 % — ABNORMAL LOW (ref 36.0–46.0)
Hemoglobin: 8.9 g/dL — ABNORMAL LOW (ref 12.0–15.0)
MCH: 22.3 pg — ABNORMAL LOW (ref 26.0–34.0)
MCHC: 30.2 g/dL (ref 30.0–36.0)
MCV: 73.9 fL — ABNORMAL LOW (ref 80.0–100.0)
Platelets: 200 10*3/uL (ref 150–400)
RBC: 3.99 MIL/uL (ref 3.87–5.11)
RDW: 23.2 % — ABNORMAL HIGH (ref 11.5–15.5)
WBC: 21.7 10*3/uL — ABNORMAL HIGH (ref 4.0–10.5)
nRBC: 0.3 % — ABNORMAL HIGH (ref 0.0–0.2)

## 2019-05-31 LAB — BPAM RBC
Blood Product Expiration Date: 202106082359
ISSUE DATE / TIME: 202105131423
Unit Type and Rh: 5100

## 2019-05-31 LAB — TYPE AND SCREEN
ABO/RH(D): O POS
Antibody Screen: NEGATIVE
Unit division: 0

## 2019-05-31 MED ORDER — SODIUM CHLORIDE 0.9 % IV SOLN
INTRAVENOUS | Status: DC | PRN
  Administered 2019-05-31: 75 mL via INTRAVENOUS
  Administered 2019-06-04: 500 mL via INTRAVENOUS
  Administered 2019-06-04 – 2019-06-05 (×2): 250 mL via INTRAVENOUS

## 2019-05-31 MED ORDER — METRONIDAZOLE 500 MG PO TABS
500.0000 mg | ORAL_TABLET | Freq: Three times a day (TID) | ORAL | Status: DC
  Administered 2019-05-31 – 2019-06-03 (×9): 500 mg via ORAL
  Filled 2019-05-31 (×10): qty 1

## 2019-05-31 MED ORDER — METRONIDAZOLE IN NACL 5-0.79 MG/ML-% IV SOLN
500.0000 mg | Freq: Three times a day (TID) | INTRAVENOUS | Status: DC
  Administered 2019-05-31 (×2): 500 mg via INTRAVENOUS
  Filled 2019-05-31 (×4): qty 100

## 2019-05-31 MED ORDER — SODIUM CHLORIDE 0.9 % IV SOLN
2.0000 g | INTRAVENOUS | Status: DC
  Administered 2019-05-31 – 2019-06-03 (×4): 2 g via INTRAVENOUS
  Filled 2019-05-31 (×3): qty 2
  Filled 2019-05-31: qty 20

## 2019-05-31 MED ORDER — IOHEXOL 9 MG/ML PO SOLN
500.0000 mL | ORAL | Status: DC | PRN
  Administered 2019-05-31: 500 mL via ORAL

## 2019-05-31 NOTE — Progress Notes (Signed)
Patient ID: [name], female   DOB: Sep 21, 1955, 64 y.o.   MRN: [id number] [facility] PROGRESS NOTE  BIRIDIANA TWARDOWSKI EHM:[id number] DOB: 05-27-1955 DOA: 05/26/2019 PCP: [facility]  HPI/Subjective: Patient feels tired and weak.  Her legs are painful to her.  Patient unable to walk.  She does not recall her husband's phone number.  Objective: Vitals:   05/31/19 1058 05/31/19 1548  BP: (!) 123/47 (!) 112/46  Pulse: 74 81  Resp: 18 18  Temp: (!) 97.5 F (36.4 C) 98.3 F (36.8 C)  SpO2: 100% 98%    Intake/Output Summary (Last 24 hours) at 05/31/2019 1555 Last data filed at 05/31/2019 1330 Gross per 24 hour  Intake 1176.48 ml  Output 700 ml  Net 476.48 ml   Filed Weights   05/29/19 0431 05/30/19 0420 05/31/19 0523  Weight: 120.2 kg 120 kg 119 kg    ROS: Review of Systems  Constitutional: Positive for malaise/fatigue. Negative for fever.  Eyes: Negative for blurred vision.  Respiratory: Negative for cough and shortness of breath.   Cardiovascular: Negative for chest pain.  Gastrointestinal: Negative for abdominal pain, nausea and vomiting.  Genitourinary: Negative for dysuria.  Musculoskeletal: Positive for joint pain.  Neurological: Negative for dizziness.   Exam: Physical Exam  HENT:  Nose: No mucosal edema.  Mouth/Throat: No oropharyngeal exudate or posterior oropharyngeal edema.  Eyes: Conjunctivae and lids are normal.  Neck: Carotid bruit is not present.  Cardiovascular: S1 normal and S2 normal. Exam reveals no gallop.  No murmur heard. Respiratory: No respiratory distress. She has decreased breath sounds in the right lower field and the left lower field. She has no wheezes. She has no rhonchi. She has no rales.  GI: Soft. Bowel sounds are normal. There is no abdominal tenderness.  Musculoskeletal:     Right ankle: Swelling present.     Left ankle: Swelling present.  Lymphadenopathy:    She has no cervical adenopathy.  Neurological: She is  alert.  Patient unable to lift her legs up off the bed  Skin: Skin is warm. No rash noted. Nails show no clubbing.  Left shin a couple areas of skin breakdown with no drainage Right shin larger area of skin breakdown with slight greenish drainage  Psychiatric: She has a normal mood and affect.      Data Reviewed: Basic Metabolic Panel: Recent Labs  Lab 05/27/19 0530 05/28/19 0607 05/29/19 0422 05/30/19 0437 05/31/19 0513  NA 130* 134* 135 136 136  K 4.7 4.6 4.7 4.7 4.7  CL 101 102 103 103 103  CO2 20* 21* 22 23 24   GLUCOSE 193* 197* 132* 169* 218*  BUN 32* 45* 46* 52* 51*  CREATININE 1.88* 2.28* 2.39* 2.37* 2.14*  CALCIUM 8.1* 8.2* 8.0* 7.8* 8.1*   CBC: Recent Labs  Lab 05/26/19 1457 05/27/19 0530 05/30/19 0437 05/30/19 0535 05/31/19 0513  WBC 6.9 8.9 18.5* 19.6* 21.7*  NEUTROABS 6.5 8.3*  --   --   --   HGB 7.8* 7.3* 6.7* 6.8* 8.9*  HCT 27.1* 25.5* 23.8* 23.8* 29.5*  MCV 75.9* 75.7* 74.8* 74.6* 73.9*  PLT 179 152 193 189 200   Cardiac Enzymes: Recent Labs  Lab 05/26/19 1457  CKTOTAL 162   BNP (last 3 results) Recent Labs    08/17/18 1030 05/04/19 1133 05/26/19 1457  BNP 861.0* 778.0* 827.0*    CBG: Recent Labs  Lab 05/30/19 1730 05/30/19 2104 05/30/19 2150 05/31/19 0732 05/31/19 1156  GLUCAP 164* 208* 195* 193*  189*    Recent Results (from the past 240 hour(s))  Culture, blood (routine x 2)     Status: Abnormal   Collection Time: 05/26/19  2:57 PM   Specimen: BLOOD  Result Value Ref Range Status   Specimen Description   Final    BLOOD BLOOD LEFT FOREARM Performed at [facility], [address], [place], [place] [address]    Special Requests   Final    BOTTLES DRAWN AEROBIC AND ANAEROBIC Blood Culture results may not be optimal due to an inadequate volume of blood received in culture bottles Performed at [facility], [address], [place], [place] [address]    Culture  Setup Time   Final    GRAM POSITIVE  COCCI IN BOTH AEROBIC AND ANAEROBIC BOTTLES CRITICAL RESULT CALLED TO, READ BACK BY AND VERIFIED WITH: DAVID BESANTI 05/27/19 AT 0237 HS    Culture (A)  Final    STREPTOCOCCUS GROUP C SUSCEPTIBILITIES PERFORMED ON PREVIOUS CULTURE WITHIN THE LAST 5 DAYS. Performed at [facility], [address] [place], [place] [address]    Report Status 05/29/2019 FINAL  Final  Culture, blood (routine x 2)     Status: Abnormal   Collection Time: 05/26/19  2:57 PM   Specimen: BLOOD  Result Value Ref Range Status   Specimen Description   Final    BLOOD LEFT ANTECUBITAL Performed at [facility], [address], [place], [place] [address]    Special Requests   Final    BOTTLES DRAWN AEROBIC AND ANAEROBIC Blood Culture adequate volume Performed at [facility], [address] [place], [place] [address]    Culture  Setup Time   Final    GRAM POSITIVE COCCI IN BOTH AEROBIC AND ANAEROBIC BOTTLES CRITICAL VALUE NOTED.  VALUE IS CONSISTENT WITH PREVIOUSLY REPORTED AND CALLED VALUE. Performed at [facility], [place], [place] [address]    Culture STREPTOCOCCUS GROUP C (A)  Final   Report Status 05/29/2019 FINAL  Final   Organism ID, Bacteria STREPTOCOCCUS GROUP C  Final      Susceptibility   Streptococcus group c - MIC*    CLINDAMYCIN <=0.25 SENSITIVE Sensitive     AMPICILLIN <=0.25 SENSITIVE Sensitive     ERYTHROMYCIN <=0.12 SENSITIVE Sensitive     VANCOMYCIN 0.25 SENSITIVE Sensitive     CEFTRIAXONE <=0.12 SENSITIVE Sensitive     LEVOFLOXACIN 0.5 SENSITIVE Sensitive     PENICILLIN Value in next row Sensitive      SENSITIVE<=0.06    * STREPTOCOCCUS GROUP C  Respiratory Panel by RT PCR (Flu A&B, Covid) - Nasopharyngeal Swab     Status: None   Collection Time: 05/26/19  2:57 PM   Specimen: Nasopharyngeal Swab  Result Value Ref Range Status   SARS Coronavirus 2 by RT PCR NEGATIVE NEGATIVE Final    Comment: (NOTE) SARS-CoV-2 target nucleic acids  are NOT DETECTED. The SARS-CoV-2 RNA is generally detectable in upper respiratoy specimens during the acute phase of infection. The lowest concentration of SARS-CoV-2 viral copies this assay can detect is 131 copies/mL. A negative result does not preclude SARS-Cov-2 infection and should not be used as the sole basis for treatment or other patient management decisions. A negative result may occur with  improper specimen collection/handling, submission of specimen other than nasopharyngeal swab, presence of viral mutation(s) within the areas targeted by this assay, and inadequate number of viral copies (<131 copies/mL). A negative result must be combined with  clinical observations, patient history, and epidemiological information. The expected result is Negative. Fact Sheet for Patients:  PinkCheek.be Fact Sheet for Healthcare Providers:  GravelBags.it This test is not yet ap proved or cleared by the [place] FDA and  has been authorized for detection and/or diagnosis of SARS-CoV-2 by FDA under an Emergency Use Authorization (EUA). This EUA will remain  in effect (meaning this test can be used) for the duration of the COVID-19 declaration under Section 564(b)(1) of the Act, 21 U.S.C. section 360bbb-3(b)(1), unless the authorization is terminated or revoked sooner.    Influenza A by PCR NEGATIVE NEGATIVE Final   Influenza B by PCR NEGATIVE NEGATIVE Final    Comment: (NOTE) The Xpert Xpress SARS-CoV-2/FLU/RSV assay is intended as an aid in  the diagnosis of influenza from Nasopharyngeal swab specimens and  should not be used as a sole basis for treatment. Nasal washings and  aspirates are unacceptable for Xpert Xpress SARS-CoV-2/FLU/RSV  testing. Fact Sheet for Patients: PinkCheek.be Fact Sheet for Healthcare Providers: GravelBags.it This test is not yet approved or  cleared by the [place] FDA and  has been authorized for detection and/or diagnosis of SARS-CoV-2 by  FDA under an Emergency Use Authorization (EUA). This EUA will remain  in effect (meaning this test can be used) for the duration of the  Covid-19 declaration under Section 564(b)(1) of the Act, 21  U.S.C. section 360bbb-3(b)(1), unless the authorization is  terminated or revoked. Performed at [facility], [address] [place], [place] [address]   Blood Culture ID Panel (Reflexed)     Status: Abnormal   Collection Time: 05/26/19  2:57 PM  Result Value Ref Range Status   Enterococcus species NOT DETECTED NOT DETECTED Final   Listeria monocytogenes NOT DETECTED NOT DETECTED Final   Staphylococcus species NOT DETECTED NOT DETECTED Final   Staphylococcus aureus (BCID) NOT DETECTED NOT DETECTED Final   Streptococcus species DETECTED (A) NOT DETECTED Final    Comment: Not Enterococcus species, Streptococcus agalactiae, Streptococcus pyogenes, or Streptococcus pneumoniae. CRITICAL RESULT CALLED TO, READ BACK BY AND VERIFIED WITH: DAVID BESANTI 05/27/19 AT 5993 HS    Streptococcus agalactiae NOT DETECTED NOT DETECTED Final   Streptococcus pneumoniae NOT DETECTED NOT DETECTED Final   Streptococcus pyogenes NOT DETECTED NOT DETECTED Final   Acinetobacter baumannii NOT DETECTED NOT DETECTED Final   Enterobacteriaceae species NOT DETECTED NOT DETECTED Final   Enterobacter cloacae complex NOT DETECTED NOT DETECTED Final   Escherichia coli NOT DETECTED NOT DETECTED Final   Klebsiella oxytoca NOT DETECTED NOT DETECTED Final   Klebsiella pneumoniae NOT DETECTED NOT DETECTED Final   Proteus species NOT DETECTED NOT DETECTED Final   Serratia marcescens NOT DETECTED NOT DETECTED Final   Haemophilus influenzae NOT DETECTED NOT DETECTED Final   Neisseria meningitidis NOT DETECTED NOT DETECTED Final   Pseudomonas aeruginosa NOT DETECTED NOT DETECTED Final   Candida albicans NOT  DETECTED NOT DETECTED Final   Candida glabrata NOT DETECTED NOT DETECTED Final   Candida krusei NOT DETECTED NOT DETECTED Final   Candida parapsilosis NOT DETECTED NOT DETECTED Final   Candida tropicalis NOT DETECTED NOT DETECTED Final    Comment: Performed at [facility], [address] [place], [place] [address]  Culture, blood (x 2)     Status: None (Preliminary result)   Collection Time: 05/27/19  6:54 PM   Specimen: BLOOD  Result Value Ref Range Status   Specimen Description BLOOD BLOOD RIGHT HAND  Final   Special Requests  Final    BOTTLES DRAWN AEROBIC AND ANAEROBIC Blood Culture adequate volume   Culture   Final    NO GROWTH 4 DAYS Performed at [facility], [address] [place], [place] [address]    Report Status PENDING  Incomplete  Culture, blood (x 2)     Status: None (Preliminary result)   Collection Time: 05/27/19  6:56 PM   Specimen: BLOOD  Result Value Ref Range Status   Specimen Description BLOOD BLOOD LEFT HAND  Final   Special Requests   Final    BOTTLES DRAWN AEROBIC AND ANAEROBIC Blood Culture results may not be optimal due to an inadequate volume of blood received in culture bottles   Culture   Final    NO GROWTH 4 DAYS Performed at [facility], [address], [place], [place] [address]    Report Status PENDING  Incomplete     Scheduled Meds: . ammonium lactate  1 application Topical BID  . vitamin C  250 mg Oral BID  . atorvastatin  40 mg Oral Daily  . enoxaparin (LOVENOX) injection  40 mg Subcutaneous Q12H  . feeding supplement (ENSURE ENLIVE)  237 mL Oral BID  . feeding supplement (PRO-STAT SUGAR FREE 64)  30 mL Oral BID  . ferrous sulfate  325 mg Oral BID WC  . fluticasone  2 spray Each Nare Daily  . folic acid  1 mg Oral Daily  . insulin aspart  0-9 Units Subcutaneous TID WC  . lactulose  20 g Oral TID  . mirtazapine  30 mg Oral QHS  . multivitamin with minerals  1 tablet Oral Daily   Continuous  Infusions: . sodium chloride 75 mL (05/31/19 1236)  . cefTRIAXone (ROCEPHIN)  IV 2 g (05/31/19 1237)  . metronidazole 500 mg (05/31/19 1356)    Assessment/Plan:  1. Sepsis with group C streptococcus with acute kidney injury.  Present on admission.  Case discussed with infectious disease and they changed antibiotics over to Rocephin.  TEE negative for vegetations.  Patient will need a PICC line.  Repeat blood cultures negative.  Infectious disease specialist recommends a CT scan of the abdomen and pelvis.  White blood cell count still elevated at 21.7. 2. Anemia of chronic disease.  Hemoglobin responded well to blood transfusion.  Hemoglobin came up from 6.9-8.9. 3. Acute metabolic encephalopathy.  Ammonia level only 17. Patient on lactulose. 4. Hypotension.  Blood pressure better off antihypertensive medication 5. Acute kidney injury on chronic kidney disease stage IIIb.  Creatinine peaked at at 2.37 and has come down to 2.14 today.  Likely will see continued improvement 6. Pressure injury distal thigh, present on admission. 7. Bilateral lower extremity wounds.  Continue antibiotics 8. 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  Patient on sliding scale insulin 9. Hyperlipidemia on Lipitor 10. Immobility  Pressure Injury 05/05/19 Thigh Distal;Left;Posterior Unstageable - Full thickness tissue loss in which the base of the injury is covered by slough (yellow, tan, gray, green or brown) and/or eschar (tan, brown or black) in the wound bed. 4"x4" area with m (Active)  05/05/19 0700  Location: Thigh  Location Orientation: Distal;Left;Posterior  Staging: Unstageable - Full thickness tissue loss in which the base of the injury is covered by slough (yellow, tan, gray, green or brown) and/or eschar (tan, brown or black) in the wound bed.  Wound Description (Comments): 4"x4" area with mixture of pink granulated wound bed and yellow slough.  Present on Admission: Yes  Code Status:     Code Status Orders  (From admission, onward)         Start     Ordered   05/28/19 1030  Full code  Continuous     05/28/19 1029        Code Status History    Date Active Date Inactive Code Status Order ID Comments User Context   05/04/2019 1534 05/10/2019 2332 Full Code [id number]  Debbe Odea, MD ED   02/23/2019 2229 02/27/2019 1835 Full Code [id number]  Lenore Cordia, MD ED   12/16/2018 1704 01/02/2019 1845 Full Code [id number]  Eugenie Filler, MD ED   11/20/2018 1812 12/08/2018 0528 Full Code [id number]  Modena Nunnery D, DO ED   10/26/2018 0048 11/08/2018 0023 Full Code [id number]  Lang Snow, NP ED   08/17/2018 1820 08/22/2018 1805 Full Code [id number]  Dustin Flock, MD Inpatient   06/20/2018 2312 06/27/2018 2119 Full Code [id number]  Mayer Camel, NP ED   01/13/2018 1540 01/16/2018 1859 Full Code [id number]  Loletha Grayer, MD ED   11/03/2017 1758 11/06/2017 2136 Full Code [id number]  Salary, Avel Peace, MD Inpatient   09/26/2017 2353 09/30/2017 1404 Full Code [id number]  Lance Coon, MD Inpatient   08/27/2017 0347 08/29/2017 2054 Full Code [id number]  Harrie Foreman, MD Inpatient   08/07/2017 1704 08/09/2017 2237 Full Code [id number]  Saundra Shelling, MD Inpatient   12/18/2016 1146 12/26/2016 2032 Full Code [id number]  Vaughan Basta, MD Inpatient   12/18/2016 0959 12/18/2016 1146 Full Code [id number]  Vaughan Basta, MD ED   12/04/2015 0509 12/07/2015 1756 Full Code [id number]  Holley Raring, NP ED   10/28/2015 1640 10/29/2015 1711 Full Code [id number]  Bettey Costa, MD Inpatient   08/29/2015 1438 08/30/2015 1750 Full Code [id number]  Hugelmeyer, Ubaldo Glassing, DO Inpatient   Advance Care Planning Activity     Family Communication: I have been unable to contact the husband during this entire hospital course. Disposition Plan: Status is: Inpatient  Dispo: The patient is from: Home              Anticipated d/c is to: Patient agreeable to go to  rehab              Anticipated d/c date is: Depending on progress may be 06/03/2019.              Patient currently switched over to IV Rocephin.  White blood cell count still high.  Infectious disease doctor would like to get a CAT scan of the abdomen pelvis to see if we can find a source for the patient's repeated group C strep infection in the blood.  Patient's overall prognosis is poor secondary to immobility, not eating and acute metabolic encephalopathy.  It also will be hard to make a disposition without contacting the patient's husband.  Consultants:  Infectious disease  Palliative care  Antibiotics:  Rocephin  Time spent: 28 minutes.  Case discussed with infectious disease specialist, transitional care team  [employer]  [facility]

## 2019-05-31 NOTE — Progress Notes (Signed)
Date of Admission:  05/26/2019     ID: [name] is a 64 y.o. 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)   DNR (do not resuscitate) discussion    Subjective: C/o severe pain rt foot No fever   Medications:  . ammonium lactate  1 application Topical BID  . vitamin C  250 mg Oral BID  . atorvastatin  40 mg Oral Daily  . enoxaparin (LOVENOX) injection  40 mg Subcutaneous Q12H  . feeding supplement (ENSURE ENLIVE)  237 mL Oral BID  . feeding supplement (PRO-STAT SUGAR FREE 64)  30 mL Oral BID  . ferrous sulfate  325 mg Oral BID WC  . fluticasone  2 spray Each Nare Daily  . folic acid  1 mg Oral Daily  . insulin aspart  0-9 Units Subcutaneous TID WC  . lactulose  20 g Oral TID  . mirtazapine  30 mg Oral QHS  . multivitamin with minerals  1 tablet Oral Daily    Objective: Vital signs in last 24 hours: Temp:  [97.5 F (36.4 C)-98.6 F (37 C)] 97.5 F (36.4 C) (05/14 1058) Pulse Rate:  [65-85] 74 (05/14 1058) Resp:  [10-19] 18 (05/14 1058) BP: (105-148)/(47-87) 123/47 (05/14 1058) SpO2:  [90 %-100 %] 100 % (05/14 1058) Weight:  [119 kg] 119 kg (05/14 0523)  PHYSICAL EXAM:  General: lethargic Coarse features, anasarca  repsonds to questions appropriately Chronically ill Head: Normocephalic, without obvious  abnormality, atraumatic. Eyes: Conjunctivae clear, anicteric sclerae. Pupils are equal ENT Nares normal. No drainage or sinus tenderness. Lips, mucosa, and tongue normal. No Thrush Neck: Supple, Back:  Sacrum, posterior thigh superficial excoriations- not infected        . Lungs: b/l air entry- decreased bases  Heart: s1s2 Abdomen: obese , abd wall edema  Extremities: b/l edema legs upto hips Rt foot very tender, eryhtematous and oozing        Skin: No rashes or lesions. Or bruising Lymph: Cervical, supraclavicular normal. Neurologic: Grossly non-focal  Lab Results Recent Labs    05/30/19 0437 05/30/19 0437 05/30/19 0535 05/31/19 0513  WBC 18.5*   < > 19.6* 21.7*  HGB 6.7*   < > 6.8* 8.9*  HCT 23.8*   < > 23.8* 29.5*  NA 136  --   --  136  K 4.7  --   --  4.7  CL 103  --   --  103  CO2 23  --   --  24  BUN 52*  --   --  51*  CREATININE 2.37*  --   --  2.14*   < > = values in this interval not displayed.   Liver Panel No results for input(s): PROT, ALBUMIN, AST, ALT, ALKPHOS, BILITOT, BILIDIR, IBILI in the last 72 hours.  Sedimentation Rate No results for input(s): ESRSEDRATE in the last 72 hours. C-Reactive Protein No results for input(s): CRP in the last 72 hours.  Microbiology:  Studies/Results: [place] EKG SITE RITE  Result Date: 05/30/2019 If Site Rite image not attached, placement could not be confirmed due to current cardiac rhythm.    Assessment/Plan: Group C streptococcus bacteremia : this is a skin/GI/oral organisms. Likely source Rt leg -She had same bacteremia last year. 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

## 2019-05-31 NOTE — Evaluation (Signed)
Physical Therapy Evaluation Patient Details Name: VALEDA CORZINE MRN: [id number] DOB: [birth year] Today's Date: 05/31/2019   History of Present Illness  Patient is a 64 year old female with sepsis, COPD, chronic diastolic CHF, DM type II, diabetic neuropathy, obesity, AMS, CKD stage III, hep C with cirrhosis, anasarca, cellulitis, diabetic foot ulcer, acute metabolic encephalopathy, hypotension, AKI superimposed on CKD, cervical cancer, nSTEMI. . Patient was discharged from this facility on 05/10/19; she refused to go to rehab despite therapy recommendation. Patient uses walker for transfers at home to/from manual w/c but is not able to propel w/c herself.  Clinical Impression  Patient is a 64 year old female who presents with generalized weakness and limited mobility. She has had significant decline in her already limited functional mobility since her last admission with patient not tolerating bed mobility and/or transfers. She is in bed upon PT arrival and is oriented to self. Upon questioning prior level of mobility and home environment patient becomes confused and contradicts herself. Primary use of history is her previous documentation due to confusion. Her RLE is limited in mobility with high level of edema and pain present limiting ability to test strength. Patient requires max/total A for rolling in bed and c/o of high level of pain in arms and legs. Upon attempt to transition to EOB patient cried out in pain from LE's and UE's and resisted further movement. Attempts at long sitting met in similar fashion. Patient returned to supine position and made comfortable with needs met. Patient would benefit from skilled physical therapy while hospitalized to improve strength, mobility, and ability to self pressure relief. Patient will need to go to SNF after discharge due to high fall risk, limited ability to self care, and need to address above impairments.     Follow Up Recommendations SNF     Equipment Recommendations  None recommended by PT    Recommendations for Other Services       Precautions / Restrictions Precautions Precautions: Fall Restrictions Weight Bearing Restrictions: No      Mobility  Bed Mobility Overal bed mobility: Needs Assistance Bed Mobility: Supine to Sit;Rolling Rolling: Max assist   Supine to sit: Total assist;HOB elevated;Max assist(unable to perform with total/max A)     General bed mobility comments: Patient requires total/max A to attempt sitting, unable to achieve sitting EOB or long sitting with patient verbalizing pain in UE's and LE's and resisting PT attempts.  Transfers                 General transfer comment: unable to attempt  Ambulation/Gait             General Gait Details: unable to attempt  Stairs            Wheelchair Mobility    Modified Rankin (Stroke Patients Only)       Balance Overall balance assessment: History of Falls                                           Pertinent Vitals/Pain Pain Assessment: 0-10 Pain Score: 8  Pain Location: bilateral LE's and UE's Pain Descriptors / Indicators: Aching;Tightness;Sharp Pain Intervention(s): Limited activity within patient's tolerance;Monitored during session    Home Living Family/patient expects to be discharged to:: Private residence Living Arrangements: Spouse/significant other Available Help at Discharge: Family Type of Home: House Home Access: Stairs to enter;Ramped entrance Entrance  Stairs-Rails: Right;Left;Can reach both Entrance Stairs-Number of Steps: 3 Home Layout: One level Home Equipment: Walker - 2 wheels;Wheelchair - [employer];Tub bench;Grab bars - tub/shower;Hospital bed Additional Comments: lives with husband who is wheelchair-bound as well. uses a walker to get from her hospital bed in the living room to her w/c and /or bathroom per patient report.    Prior Function Level of  Independence: Needs assistance   Gait / Transfers Assistance Needed: Per patient she is able to transfer with her walker to/from hospital bed and manual w/c. Is unable to propel her w/c however. Patient states she was home but then states she was in assisted living.  ADL's / Homemaking Assistance Needed: Patient reports she needs some help with dressing, needs help with bathing  Comments: Patient is a poor historian, changing between living at home vs an assisted living facility with questioning. History clarified from previous documentation.     Hand Dominance   Dominant Hand: Right    Extremity/Trunk Assessment   Upper Extremity Assessment Upper Extremity Assessment: Defer to OT evaluation    Lower Extremity Assessment Lower Extremity Assessment: RLE deficits/detail;LLE deficits/detail RLE Deficits / Details: limited in ability to move against gravity, edema and swollen appearance with weeping wounds. RLE: Unable to fully assess due to pain RLE Sensation: decreased light touch;history of peripheral neuropathy RLE Coordination: decreased gross motor LLE Deficits / Details: able to move partially against gravity, more than RLE, ~2/5 strength LLE: Unable to fully assess due to pain LLE Sensation: decreased light touch;history of peripheral neuropathy LLE Coordination: decreased gross motor       Communication   Communication: Other (comment)(some drowsiness in speech, delayed answering)  Cognition Arousal/Alertness: Lethargic;Awake/alert Behavior During Therapy: Flat affect Overall Cognitive Status: No family/caregiver present to determine baseline cognitive functioning                                 General Comments: Patient is able to state her name but is confused about her previous living situation and mobility. Frequently gazes towards the right requiring cueing to look to the left.      General Comments General comments (skin integrity, edema, etc.):  weeping wounds on LE's, bilateral edema and discoloration    Exercises General Exercises - Lower Extremity Ankle Circles/Pumps: AROM;15 reps;Supine;Limitations Ankle Circles/Pumps Limitations: ROM is very limited Heel Slides: AAROM;Both;15 reps;Supine Hip ABduction/ADduction: AAROM;Both;Supine;10 reps Other Exercises Other Exercises: Patient educated on need for bed mobility and transferring to EOB for pressure relief and strengthening.   Assessment/Plan    PT Assessment Patient needs continued PT services  PT Problem List Decreased strength;Decreased mobility;Decreased activity tolerance;Decreased range of motion;Pain;Decreased balance;Impaired sensation;Obesity       PT Treatment Interventions DME instruction;Gait training;Stair training;Functional mobility training;Therapeutic activities;Therapeutic exercise;Balance training;Patient/family education;Neuromuscular re-education;Manual techniques;Wheelchair mobility training    PT Goals (Current goals can be found in the Care Plan section)  Acute Rehab PT Goals Patient Stated Goal: "to get better" PT Goal Formulation: With patient Time For Goal Achievement: 06/14/19 Potential to Achieve Goals: Fair    Frequency Min 2X/week   Barriers to discharge Decreased caregiver support      Co-evaluation               AM-PAC PT "6 Clicks" Mobility  Outcome Measure Help needed turning from your back to your side while in a flat bed without using bedrails?: A Lot Help needed moving from lying on your back  to sitting on the side of a flat bed without using bedrails?: Total Help needed moving to and from a bed to a chair (including a wheelchair)?: Total Help needed standing up from a chair using your arms (e.g., wheelchair or bedside chair)?: Total Help needed to walk in hospital room?: Total Help needed climbing 3-5 steps with a railing? : Total 6 Click Score: 7    End of Session   Activity Tolerance: Patient limited by  fatigue Patient left: in bed;with call bell/phone within reach;with bed alarm set Nurse Communication: Mobility status PT Visit Diagnosis: Difficulty in walking, not elsewhere classified (R26.2);Other abnormalities of gait and mobility (R26.89);Muscle weakness (generalized) (M62.81);Pain Pain - Right/Left: Right(bilateral) Pain - part of body: Leg;Arm    Time: 0712-1975 PT Time Calculation (min) (ACUTE ONLY): 14 min   Charges:   PT Evaluation $PT Eval Moderate Complexity: 1 Mod        Janna Arch, PT, DPT   05/31/2019, 12:01 PM

## 2019-06-01 ENCOUNTER — Inpatient Hospital Stay: Payer: Medicaid Other

## 2019-06-01 LAB — BASIC METABOLIC PANEL
Anion gap: 8 (ref 5–15)
BUN: 44 mg/dL — ABNORMAL HIGH (ref 8–23)
CO2: 24 mmol/L (ref 22–32)
Calcium: 8 mg/dL — ABNORMAL LOW (ref 8.9–10.3)
Chloride: 104 mmol/L (ref 98–111)
Creatinine, Ser: 1.95 mg/dL — ABNORMAL HIGH (ref 0.44–1.00)
GFR calc Af Amer: 31 mL/min — ABNORMAL LOW (ref >60)
GFR calc non Af Amer: 27 mL/min — ABNORMAL LOW (ref >60)
Glucose, Bld: 159 mg/dL — ABNORMAL HIGH (ref 70–99)
Potassium: 4.5 mmol/L (ref 3.5–5.1)
Sodium: 136 mmol/L (ref 135–145)

## 2019-06-01 LAB — CULTURE, BLOOD (ROUTINE X 2)
Culture: NO GROWTH
Culture: NO GROWTH
Special Requests: ADEQUATE

## 2019-06-01 LAB — CBC
HCT: 28.1 % — ABNORMAL LOW (ref 36.0–46.0)
Hemoglobin: 8.3 g/dL — ABNORMAL LOW (ref 12.0–15.0)
MCH: 22.3 pg — ABNORMAL LOW (ref 26.0–34.0)
MCHC: 29.5 g/dL — ABNORMAL LOW (ref 30.0–36.0)
MCV: 75.5 fL — ABNORMAL LOW (ref 80.0–100.0)
Platelets: 198 10*3/uL (ref 150–400)
RBC: 3.72 MIL/uL — ABNORMAL LOW (ref 3.87–5.11)
RDW: 23.6 % — ABNORMAL HIGH (ref 11.5–15.5)
WBC: 24.2 10*3/uL — ABNORMAL HIGH (ref 4.0–10.5)
nRBC: 0.1 % (ref 0.0–0.2)

## 2019-06-01 LAB — GLUCOSE, CAPILLARY
Glucose-Capillary: 151 mg/dL — ABNORMAL HIGH (ref 70–99)
Glucose-Capillary: 140 mg/dL — ABNORMAL HIGH (ref 70–99)
Glucose-Capillary: 132 mg/dL — ABNORMAL HIGH (ref 70–99)

## 2019-06-01 MED ORDER — OXYCODONE HCL 5 MG PO TABS
5.0000 mg | ORAL_TABLET | ORAL | Status: DC | PRN
  Administered 2019-06-01 – 2019-06-02 (×6): 5 mg via ORAL
  Filled 2019-06-01 (×6): qty 1

## 2019-06-01 MED ORDER — CHLORHEXIDINE GLUCONATE CLOTH 2 % EX PADS
6.0000 | MEDICATED_PAD | Freq: Every day | CUTANEOUS | Status: DC
  Administered 2019-06-01 – 2019-06-05 (×5): 6 via TOPICAL

## 2019-06-01 MED ORDER — SODIUM CHLORIDE 0.9% FLUSH: 10.0000 mL | INTRAVENOUS | Status: DC | PRN

## 2019-06-01 MED ORDER — SODIUM CHLORIDE 0.9% FLUSH
10.0000 mL | Freq: Two times a day (BID) | INTRAVENOUS | Status: DC
  Administered 2019-06-01 – 2019-06-05 (×9): 10 mL

## 2019-06-01 MED ORDER — GADOBUTROL 1 MMOL/ML IV SOLN
10.0000 mL | Freq: Once | INTRAVENOUS | Status: AC | PRN
  Administered 2019-06-01: 10 mL via INTRAVENOUS

## 2019-06-01 NOTE — Consult Note (Signed)
PODIATRY / FOOT AND ANKLE SURGERY CONSULTATION NOTE  Requesting Physician: Loletha Grayer, MD  Reason for consult: Bilateral lower extremity swelling, right foot and ankle redness, sepsis  Chief Complaint: Bilateral leg pain   HPI: [name] is a 64 y.o. female who presents with with bilateral leg swelling and extreme right foot and ankle pain as well as leg pain.  Patient has extremely long past medical history including congestive heart failure, chronic anemia, CKD, hepatitis C with cirrhosis, chronic venous leg ulcers, diabetes type 2 with polyneuropathy, NSTEMI, COPD.  For patient's admission she had had difficulty walking for 3 days prior to it and had significant pain to the right foot as well as shortness of breath.  She went to the emergency room due to this condition and was admitted for further work-up.  Infectious disease noted that there is extreme tenderness about the right foot and ankle which could be consistent with possible abscess.  Podiatry team was consulted for further evaluation.  Patient presents today resting in bed but anything that touches her right foot she yells out in pain.  PMHx:  Past Medical History:  Diagnosis Date  . Cervical cancer ([place])   . CHF (congestive heart failure) ([place])   . Chronic anemia   . COPD (chronic obstructive pulmonary disease) ([place])   . Diabetes mellitus without complication ([place])   . Gastric ulcer   . NSTEMI (non-ST elevated myocardial infarction) ([place])   . Upper GI bleed     Surgical Hx:  Past Surgical History:  Procedure Laterality Date  . AMPUTATION Left 06/24/2018   Procedure: AMPUTATION GREAT TOE;  Surgeon: Samara Deist, DPM;  Location: [facility];  Service: Podiatry;  Laterality: Left;  . APPENDECTOMY    . BIOPSY  11/22/2018   Procedure: BIOPSY;  Surgeon: Thornton Park, MD;  Location: [place];  Service: Gastroenterology;;  . CARDIAC CATHETERIZATION    . CHOLECYSTECTOMY    . COLONOSCOPY N/A 09/28/2017    Procedure: COLONOSCOPY;  Surgeon: Toledo, Benay Pike, MD;  Location: [facility]OS[facility];  Service: Gastroenterology;  Laterality: N/A;  . COLONOSCOPY WITH PROPOFOL N/A 08/21/2018   Procedure: COLONOSCOPY WITH PROPOFOL;  Surgeon: Lin Landsman, MD;  Location: [facility] ENDOSCOPY;  Service: Gastroenterology;  Laterality: N/A;  . COLONOSCOPY WITH PROPOFOL N/A 12/28/2018   Procedure: COLONOSCOPY WITH PROPOFOL;  Surgeon: Rush Landmark Telford Nab., MD;  Location: [facility];  Service: Gastroenterology;  Laterality: N/A;  . ENTEROSCOPY Left 11/06/2017   Procedure: ENTEROSCOPY;  Surgeon: Jonathon Bellows, MD;  Location: [facility] ENDOSCOPY;  Service: Gastroenterology;  Laterality: Left;  . ENTEROSCOPY N/A 06/22/2018   Procedure: ENTEROSCOPY;  Surgeon: Lin Landsman, MD;  Location: [facility] ENDOSCOPY;  Service: Gastroenterology;  Laterality: N/A;  . ENTEROSCOPY N/A 08/18/2018   Procedure: ENTEROSCOPY;  Surgeon: Jonathon Bellows, MD;  Location: [facility]COPY;  Service: Gastroenterology;  Laterality: N/A;  . ENTEROSCOPY N/A 11/01/2018   Procedure: ENTEROSCOPY;  Surgeon: Virgel Manifold, MD;  Location: [facility] ENDOSCOPY;  Service: Endoscopy;  Laterality: N/A;  . ENTEROSCOPY N/A 11/22/2018   Procedure: ENTEROSCOPY;  Surgeon: Thornton Park, MD;  Location: [place];  Service: Gastroenterology;  Laterality: N/A;  . ENTEROSCOPY Left 12/17/2018   Procedure: ENTEROSCOPY;  Surgeon: Yetta Flock, MD;  Location: [facility];  Service: Gastroenterology;  Laterality: Left;  . ENTEROSCOPY N/A 12/19/2018   Procedure: ENTEROSCOPY;  Surgeon: Yetta Flock, MD;  [facility];  Service: Gastroenterology;  Laterality: N/A;  . ENTEROSCOPY N/A 12/20/2018   Procedure: ENTEROSCOPY;  Surgeon: Lavena Bullion,  DO;  [facility];  Service: Gastroenterology;  Laterality: N/A;  . ESOPHAGOGASTRODUODENOSCOPY N/A 12/19/2016   Procedure: ESOPHAGOGASTRODUODENOSCOPY (EGD);  Surgeon: Lin Landsman, MD;   Location: [facility] ENDOSCOPY;  Service: Gastroenterology;  Laterality: N/A;  . ESOPHAGOGASTRODUODENOSCOPY N/A 09/28/2017   Procedure: ESOPHAGOGASTRODUODENOSCOPY (EGD);  Surgeon: Toledo, Benay Pike, MD;  Location: [facility] ENDOSCOPY;  Service: Gastroenterology;  Laterality: N/A;  . ESOPHAGOGASTRODUODENOSCOPY (EGD) WITH PROPOFOL N/A 08/08/2017   Procedure: ESOPHAGOGASTRODUODENOSCOPY (EGD) WITH PROPOFOL;  Surgeon: [name], MD;  Location: [facility] ENDOSCOPY;  Service: Endoscopy;  Laterality: N/A;  . GIVENS CAPSULE STUDY N/A 10/28/2018   Procedure: GIVENS CAPSULE STUDY;  Surgeon: Jonathon Bellows, MD;  Location: [facility] ENDOSCOPY;  Service: Gastroenterology;  Laterality: N/A;  . GIVENS CAPSULE STUDY N/A 12/26/2018   Procedure: GIVENS CAPSULE STUDY;  Surgeon: Rush Landmark Telford Nab., MD;  [facility];  Service: Gastroenterology;  Laterality: N/A;  . [place]    . HEMOSTASIS CLIP PLACEMENT  12/19/2018   Procedure: HEMOSTASIS CLIP PLACEMENT;  Surgeon: Yetta Flock, MD;  Location: [facility];  Service: Gastroenterology;;  . HEMOSTASIS CLIP PLACEMENT  12/28/2018   Procedure: HEMOSTASIS CLIP PLACEMENT;  Surgeon: Irving Copas., MD;  [facility];  Service: Gastroenterology;;  . HOT HEMOSTASIS N/A 12/17/2018   Procedure: HOT HEMOSTASIS (ARGON PLASMA COAGULATION/BICAP);  Surgeon: Yetta Flock, MD;  Location: [facility];  Service: Gastroenterology;  Laterality: N/A;  . HOT HEMOSTASIS N/A 12/28/2018   Procedure: HOT HEMOSTASIS (ARGON PLASMA COAGULATION/BICAP);  Surgeon: Irving Copas., MD;  Location: [facility];  Service: Gastroenterology;  Laterality: N/A;  . LEFT HEART CATH AND CORONARY ANGIOGRAPHY N/A 12/23/2016   Procedure: LEFT HEART CATH AND CORONARY ANGIOGRAPHY;  Surgeon: Teodoro Spray, MD;  Location: [place] CV LAB;  Service: Cardiovascular;  Laterality: N/A;  . LOWER EXTREMITY ANGIOGRAPHY Left 06/26/2018   Procedure: Lower Extremity  Angiography;  Surgeon: Katha Cabal, MD;  Location: [place] CV LAB;  Service: Cardiovascular;  Laterality: Left;  . SUBMUCOSAL INJECTION  12/19/2018   Procedure: SUBMUCOSAL INJECTION;  Surgeon: Yetta Flock, MD;  Location: [facility];  Service: Gastroenterology;;  . Lia Foyer TATTOO INJECTION  12/17/2018   Procedure: SUBMUCOSAL TATTOO INJECTION;  Surgeon: Yetta Flock, MD;  [facility];  Service: Gastroenterology;;  . Lia Foyer TATTOO INJECTION  12/28/2018   Procedure: SUBMUCOSAL TATTOO INJECTION;  Surgeon: Irving Copas., MD;  Location[facility];  Service: Gastroenterology;;  . TEE WITHOUT CARDIOVERSION N/A 10/29/2018   Procedure: TRANSESOPHAGEAL ECHOCARDIOGRAM (TEE);  Surgeon: Teodoro Spray, MD;  Location: [facility];  Service: Cardiovascular;  Laterality: N/A;  . TEE WITHOUT CARDIOVERSION N/A 05/30/2019   Procedure: TRANSESOPHAGEAL ECHOCARDIOGRAM (TEE);  Surgeon: Teodoro Spray, MD;  Location: [facility];  Service: Cardiovascular;  Laterality: N/A;    FHx:  Family History  Problem Relation Age of Onset  . Hypertension Mother   . Diabetes Mellitus II Mother   . Breast cancer Mother   . Heart disease Father   . Cancer Sister     Social History:  reports that she has been smoking cigarettes. She has been smoking about 0.25 packs per day. She has never used smokeless tobacco. She reports that she does not drink alcohol or use drugs.  Allergies:  Allergies  Allergen Reactions  . Latex Anaphylaxis and Rash  . Gabapentin Nausea And Vomiting and Other (See Comments)    Review of Systems: General ROS: positive for  - chills and fatigue Respiratory ROS: positive for - shortness  of breath Cardiovascular ROS: no chest pain or dyspnea on exertion Gastrointestinal ROS: no abdominal pain, change in bowel habits, or black or bloody stools Musculoskeletal ROS: positive for - gait disturbance, joint pain, joint stiffness, joint swelling,  muscle pain and muscular weakness Neurological ROS: positive for - numbness/tingling Dermatological ROS: Several superficial venous stasis type ulcerations to bilateral lower extremities, pressure ulcers heels.  Medications Prior to Admission  Medication Sig Dispense Refill  . Amino Acids-Protein Hydrolys (FEEDING SUPPLEMENT, PRO-STAT SUGAR FREE 64,) LIQD Take 30 mLs by mouth 2 (two) times daily. 887 mL 0  . ammonium lactate (AMLACTIN) 12 % cream Apply 1 g topically 2 (two) times daily.    [name] Kitchen atorvastatin (LIPITOR) 40 MG tablet Take 40 mg by mouth daily.    . carvedilol (COREG) 3.125 MG tablet Take 1 tablet (3.125 mg total) by mouth 2 (two) times daily with a meal. 60 tablet 0  . collagenase (SANTYL) ointment Apply topically daily. 15 g 0  . ferrous sulfate 325 (65 FE) MG tablet Take 1 tablet (325 mg total) by mouth 2 (two) times daily with a meal. 60 tablet 3  . fluticasone (FLONASE) 50 MCG/ACT nasal spray Place 2 sprays into both nostrils daily.    . folic acid (FOLVITE) 1 MG tablet Take 1 tablet (1 mg total) by mouth daily. 30 tablet 0  . lactulose (CHRONULAC) 10 GM/15ML solution Take 30 mLs (20 g total) by mouth 3 (three) times daily. 236 mL 0  . mirtazapine (REMERON) 30 MG tablet Take 30 mg by mouth at bedtime.    . Multiple Vitamin (MULTIVITAMIN WITH MINERALS) TABS tablet Take 1 tablet by mouth daily. 30 tablet 0  . Nutritional Supplements (RESOURCE 2.0 PO) Take 1 Bottle by mouth 2 (two) times daily.    [name] Kitchen senna-docusate (SENOKOT-S) 8.6-50 MG tablet Take 1 tablet by mouth at bedtime as needed for mild constipation. 30 tablet 0  . spironolactone (ALDACTONE) 25 MG tablet Take 12.5 mg by mouth daily.    [name] Kitchen torsemide (DEMADEX) 20 MG tablet Take 1 tablet (20 mg total) by mouth daily. 30 tablet 1  . pregabalin (LYRICA) 150 MG capsule Take 150 mg by mouth 2 (two) times daily.      Physical Exam: General: Alert and oriented.  No apparent distress.  Vascular: DP/PT pulses nonpalpable bilateral.   Severe swelling to bilateral lower extremities, appears to be nonpitting.  Neuro: Light touch sensation reduced to digits bilaterally.  Derm: Multiple venous stasis type blisters to both lower extremities extending from the calf level to the foot along with associated extreme severe swelling to bilateral lower extremities.  Deep tissue type injuries to the posterior aspects of both heels likely consistent with pressure ulcerations forming.  No obvious areas of fluctuance present to bilateral lower extremities but difficult to examine due to patient's pain.  MSK: Extreme pain on palpation to the right foot and ankle as well as calf.  Pain on palpation to the left posterior heel.  0/5 MSK strength of bilateral lower extremities.  Results for orders placed or performed during the hospital encounter of 05/26/19 (from the past 48 hour(s))  Glucose, capillary     Status: Abnormal   Collection Time: 05/30/19 12:07 PM  Result Value Ref Range   Glucose-Capillary 148 (H) 70 - 99 mg/dL    Comment: Glucose reference range applies only to samples taken after fasting for at least 8 hours.   Comment 1 Notify RN    Comment 2 Document in Chart  Glucose, capillary     Status: Abnormal   Collection Time: 05/30/19  5:30 PM  Result Value Ref Range   Glucose-Capillary 164 (H) 70 - 99 mg/dL    Comment: Glucose reference range applies only to samples taken after fasting for at least 8 hours.   Comment 1 Notify RN    Comment 2 Document in Chart   Glucose, capillary     Status: Abnormal   Collection Time: 05/30/19  9:04 PM  Result Value Ref Range   Glucose-Capillary 208 (H) 70 - 99 mg/dL    Comment: Glucose reference range applies only to samples taken after fasting for at least 8 hours.  Glucose, capillary     Status: Abnormal   Collection Time: 05/30/19  9:50 PM  Result Value Ref Range   Glucose-Capillary 195 (H) 70 - 99 mg/dL    Comment: Glucose reference range applies only to samples taken after fasting  for at least 8 hours.  Basic metabolic panel     Status: Abnormal   Collection Time: 05/31/19  5:13 AM  Result Value Ref Range   Sodium 136 135 - 145 mmol/L   Potassium 4.7 3.5 - 5.1 mmol/L   Chloride 103 98 - 111 mmol/L   CO2 24 22 - 32 mmol/L   Glucose, Bld 218 (H) 70 - 99 mg/dL    Comment: Glucose reference range applies only to samples taken after fasting for at least 8 hours.   BUN 51 (H) 8 - 23 mg/dL   Creatinine, Ser 2.14 (H) 0.44 - 1.00 mg/dL   Calcium 8.1 (L) 8.9 - 10.3 mg/dL   GFR calc non Af Amer 24 (L) >60 mL/min   GFR calc Af Amer 27 (L) >60 mL/min   Anion gap 9 5 - 15    Comment: Performed at [facility], [address] [place], [place] [address]  CBC     Status: Abnormal   Collection Time: 05/31/19  5:13 AM  Result Value Ref Range   WBC 21.7 (H) 4.0 - 10.5 K/uL   RBC 3.99 3.87 - 5.11 MIL/uL   Hemoglobin 8.9 (L) 12.0 - 15.0 g/dL    Comment: REPEATED TO VERIFY POST TRANSFUSION SPECIMEN Reticulocyte Hemoglobin testing may be clinically indicated, consider ordering this additional test [id number]    HCT 29.5 (L) 36.0 - 46.0 %   MCV 73.9 (L) 80.0 - 100.0 fL   MCH 22.3 (L) 26.0 - 34.0 pg   MCHC 30.2 30.0 - 36.0 g/dL   RDW 23.2 (H) 11.5 - 15.5 %   Platelets 200 150 - 400 K/uL   nRBC 0.3 (H) 0.0 - 0.2 %    Comment: Performed at [facility], [address] [place], [place] [address]  Glucose, capillary     Status: Abnormal   Collection Time: 05/31/19  7:32 AM  Result Value Ref Range   Glucose-Capillary 193 (H) 70 - 99 mg/dL    Comment: Glucose reference range applies only to samples taken after fasting for at least 8 hours.  Glucose, capillary     Status: Abnormal   Collection Time: 05/31/19 11:56 AM  Result Value Ref Range   Glucose-Capillary 189 (H) 70 - 99 mg/dL    Comment: Glucose reference range applies only to samples taken after fasting for at least 8 hours.  Glucose, capillary     Status: Abnormal   Collection Time: 05/31/19  4:41  PM  Result Value Ref Range   Glucose-Capillary 163 (H) 70 - 99  mg/dL    Comment: Glucose reference range applies only to samples taken after fasting for at least 8 hours.  Glucose, capillary     Status: Abnormal   Collection Time: 05/31/19  9:19 PM  Result Value Ref Range   Glucose-Capillary 179 (H) 70 - 99 mg/dL    Comment: Glucose reference range applies only to samples taken after fasting for at least 8 hours.  Basic metabolic panel     Status: Abnormal   Collection Time: 06/01/19  5:57 AM  Result Value Ref Range   Sodium 136 135 - 145 mmol/L   Potassium 4.5 3.5 - 5.1 mmol/L   Chloride 104 98 - 111 mmol/L   CO2 24 22 - 32 mmol/L   Glucose, Bld 159 (H) 70 - 99 mg/dL    Comment: Glucose reference range applies only to samples taken after fasting for at least 8 hours.   BUN 44 (H) 8 - 23 mg/dL   Creatinine, Ser 1.95 (H) 0.44 - 1.00 mg/dL   Calcium 8.0 (L) 8.9 - 10.3 mg/dL   GFR calc non Af Amer 27 (L) >60 mL/min   GFR calc Af Amer 31 (L) >60 mL/min   Anion gap 8 5 - 15    Comment: Performed at [facility], [address] [place], [place] [address]  CBC     Status: Abnormal   Collection Time: 06/01/19  5:57 AM  Result Value Ref Range   WBC 24.2 (H) 4.0 - 10.5 K/uL   RBC 3.72 (L) 3.87 - 5.11 MIL/uL   Hemoglobin 8.3 (L) 12.0 - 15.0 g/dL    Comment: Reticulocyte Hemoglobin testing may be clinically indicated, consider ordering this additional test [id number]    HCT 28.1 (L) 36.0 - 46.0 %   MCV 75.5 (L) 80.0 - 100.0 fL   MCH 22.3 (L) 26.0 - 34.0 pg   MCHC 29.5 (L) 30.0 - 36.0 g/dL   RDW 23.6 (H) 11.5 - 15.5 %   Platelets 198 150 - 400 K/uL   nRBC 0.1 0.0 - 0.2 %    Comment: Performed at [facility], [address] [place], [place] [address]  Glucose, capillary     Status: Abnormal   Collection Time: 06/01/19  7:46 AM  Result Value Ref Range   Glucose-Capillary 151 (H) 70 - 99 mg/dL    Comment: Glucose reference range applies only to samples taken after  fasting for at least 8 hours.   CT ABDOMEN PELVIS WO CONTRAST  Result Date: 05/31/2019 CLINICAL DATA:  Sepsis, CHF and COPD, upper GI bleed with bacteremia EXAM: CT ABDOMEN AND PELVIS WITHOUT CONTRAST TECHNIQUE: Multidetector CT imaging of the abdomen and pelvis was performed following the standard protocol without IV contrast. COMPARISON:  October 27, 2018 FINDINGS: Lower chest: There are moderate bilateral pleural effusions with adjacent passive atelectasis. Underlying consolidation is not excluded. There is cardiomegaly with calcification of mitral annulus. Hypoattenuation of the cardiac blood pool relative to the myocardium is suggestive of anemia. No pericardial effusion. Hepatobiliary: No worrisome focal liver lesions. Smooth liver surface contour. Normal hepatic attenuation. Patient is post cholecystectomy. Slight prominence of the biliary tree likely related to reservoir effect. No visible calcified intraductal gallstones. Pancreas: Unremarkable. No pancreatic ductal dilatation or surrounding inflammatory changes. Spleen: Normal in size without focal abnormality. Adrenals/Urinary Tract: Mild edematous thickening of the left adrenal gland, similar to prior. No suspicious adrenal lesions. Stable appearance of the multiple fluid attenuation cysts throughout both kidneys largest being an exophytic cyst  in the left lower pole measuring up to 5.4 cm. Additional stable 14 mm hyperdense cystic structure along the anterior interpolar left kidney unchanged in size but with increased attenuation since 2014 which likely reflects a hemorrhagic or proteinaceous cyst. No concerning new renal masses. Few punctate vascular calcifications versus nonobstructing uroliths in the upper poles both kidneys. No obstructive urolithiasis or hydronephrosis. Moderate bilateral perinephric stranding is similar to comparison study. Urinary bladder is unremarkable. Stomach/Bowel: Normal esophagus, stomach and duodenum. No small bowel  thickening or dilatation. High attenuation contrast media passes to the level of the cecum. Appendix is not visualized. No focal inflammation the vicinity of the cecum to suggest an occult appendicitis. No colonic dilatation or wall thickening. Scattered colonic diverticula without focal pericolonic inflammation to suggest diverticulitis. Vascular/Lymphatic: Atherosclerotic calcifications throughout the abdominal aorta and branch vessels. No aneurysm or ectasia. Multiple borderline enlarged inguinal nodes are similar to prior and likely reactive. Additional prominent retroperitoneal nodes including a 16 mm retrocaval node (2/38) are unchanged from comparison study as well. Reproductive: Anteverted uterus. No concerning adnexal lesions. Other: Extensive severe body wall edema. Fat containing ventral hernia with portion of the small bowel closely approximating the 3.1 cm fascial defect (2/43). Additional fat containing umbilical hernia as well. No bowel containing hernias. Small volume low-attenuation fluid throughout the abdomen. Musculoskeletal: No acute osseous abnormality or suspicious osseous lesion. Multilevel degenerative changes are present in the imaged portions of the spine. Additional degenerative changes in the hips and pelvis. IMPRESSION: 1. Nonspecific bilateral perinephric stranding is similar to prior and possibly related age or diminished renal function though should correlate with urinalysis to exclude superimposed ascending tract infection. 2. Moderate bilateral pleural effusions with adjacent passive atelectasis. Underlying consolidation is not excluded. 3. Additional features of anasarca with severe body wall edema, edematous changes of the mesentery and small volume ascites. 4. Hypoattenuation of the cardiac blood pool relative to the myocardium is suggestive of anemia. 5. Stable size of a 14 mm hyperdense cystic structure in the left kidney, likely benign proteinaceous or hyperdense cyst. 6. Few  punctate vascular calcifications versus nonobstructing uroliths in the upper poles both kidneys. 7. Fat containing ventral and umbilical hernias. No bowel containing hernia. 8. Aortic Atherosclerosis (ICD10-I70.0). Electronically Signed   By: Lovena Le M.D.   On: 05/31/2019 19:49   [place] EKG SITE RITE  Result Date: 05/30/2019 If Site Rite image not attached, placement could not be confirmed due to current cardiac rhythm.   Blood pressure 135/60, pulse 84, temperature 98.5 F (36.9 C), temperature source Oral, resp. rate 17, height 5' 5"  (1.651 m), weight 118.9 kg, SpO2 97 %.  Assessment 1. Sepsis 2/2 right lower leg cellulitis likely 2/2 to venous stasis/swelling as well as multiple venous stasis blisters, concern for potential abscess 2. Diabetes type 2 polyneuropathy 3. PVD 4. Pressure ulcers both heels  Plan -Patient seen and examined -Previous x-rays reviewed and discussed with patient in detail.  No acute infection noted.  Previous right ankle ORIF with hardware intact no obvious signs of infection. -Patient has extreme swelling to bilateral lower extremities along with erythema at the right foot that extends all the way up to the mid calf level. -Patient's previous venous duplex showed no signs of DVT but due to patient's body habitus this is difficult to examine. -Patient does not appear to have any deep wounds to both feet.  Patient has pressure ulcers that are starting to form at the posterior aspects of both heels from being nonweightbearing and  stuck in bed or chair. -Ordered Prevalon boots for both feet. -Applied Xeroform to the venous stasis type blisters and ulcerations followed by 4 x 4 gauze, ABD, Kerlix, Coban from the forefoot to below the knee with some compression.  Patient needs to have compressive dressings on both legs.  Recommend dressing changes every other day depending on the saturation present. -Ordered MRI of her right foot and ankle to determine if any further  abscesses present.  If none is present then would recommend just compressive dressings and local wound care. -Appreciate ID recommendations for antibiotics. -Once patient receives MRIs and if MRIs are negative for any further deep infection then would recommend applying Unna boots.  Will place consult for wound care to place these.  Caroline More, DPM 06/01/2019, 11:43 AM

## 2019-06-01 NOTE — Progress Notes (Signed)
Patient ID: [name], female   DOB: 17-Oct-1955, 64 y.o.   MRN: [id number] [facility] PROGRESS NOTE  [name] PNT:[id number] DOB: 1955/10/27 DOA: 05/26/2019 PCP: Media  HPI/Subjective: Patient answers a few yes or no questions.  Still feels tired.  Does not have any pain unless you touch her legs.  Poor appetite and not eating very well.  Objective: Vitals:   06/01/19 0427 06/01/19 0811  BP: (!) 131/58 135/60  Pulse: 84 84  Resp: 14 17  Temp: 98.5 F (36.9 C) 98.5 F (36.9 C)  SpO2: 97% 97%    Intake/Output Summary (Last 24 hours) at 06/01/2019 1401 Last data filed at 06/01/2019 0044 Gross per 24 hour  Intake 230.2 ml  Output 375 ml  Net -144.8 ml   Filed Weights   05/30/19 0420 05/31/19 0523 06/01/19 0427  Weight: 120 kg 119 kg 118.9 kg    ROS: Review of Systems  Constitutional: Positive for malaise/fatigue. Negative for fever.  Eyes: Negative for blurred vision.  Respiratory: Negative for cough and shortness of breath.   Cardiovascular: Negative for chest pain.  Gastrointestinal: Negative for abdominal pain, nausea and vomiting.  Genitourinary: Negative for dysuria.  Musculoskeletal: Positive for joint pain.  Neurological: Negative for dizziness.   Exam: Physical Exam  HENT:  Nose: No mucosal edema.  Mouth/Throat: No oropharyngeal exudate or posterior oropharyngeal edema.  Eyes: Conjunctivae and lids are normal.  Neck: Carotid bruit is not present.  Cardiovascular: S1 normal and S2 normal. Exam reveals no gallop.  No murmur heard. Respiratory: No respiratory distress. She has decreased breath sounds in the right lower field and the left lower field. She has no wheezes. She has no rhonchi. She has no rales.  GI: Soft. Bowel sounds are normal. There is no abdominal tenderness.  Musculoskeletal:     Right ankle: Swelling present.     Left ankle: Swelling present.  Lymphadenopathy:    She has no cervical adenopathy.   Neurological: She is alert.  Patient able to straight leg raise today.  Skin: Skin is warm. No rash noted. Nails show no clubbing.  Left shin a couple areas of skin breakdown with no drainage Right shin larger area of skin breakdown with slight greenish drainage.  Right foot with chronic lower extremity discoloration and slight erythema some areas of weeping edema  Psychiatric: She has a normal mood and affect.      Data Reviewed: Basic Metabolic Panel: Recent Labs  Lab 05/28/19 0607 05/29/19 0422 05/30/19 0437 05/31/19 0513 06/01/19 0557  NA 134* 135 136 136 136  K 4.6 4.7 4.7 4.7 4.5  CL 102 103 103 103 104  CO2 21* 22 23 24 24   GLUCOSE 197* 132* 169* 218* 159*  BUN 45* 46* 52* 51* 44*  CREATININE 2.28* 2.39* 2.37* 2.14* 1.95*  CALCIUM 8.2* 8.0* 7.8* 8.1* 8.0*   CBC: Recent Labs  Lab 05/26/19 1457 05/26/19 1457 05/27/19 0530 05/30/19 0437 05/30/19 0535 05/31/19 0513 06/01/19 0557  WBC 6.9   < > 8.9 18.5* 19.6* 21.7* 24.2*  NEUTROABS 6.5  --  8.3*  --   --   --   --   HGB 7.8*   < > 7.3* 6.7* 6.8* 8.9* 8.3*  HCT 27.1*   < > 25.5* 23.8* 23.8* 29.5* 28.1*  MCV 75.9*   < > 75.7* 74.8* 74.6* 73.9* 75.5*  PLT 179   < > 152 193 189 200 198   < > = values in this  interval not displayed.   Cardiac Enzymes: Recent Labs  Lab 05/26/19 1457  CKTOTAL 162   BNP (last 3 results) Recent Labs    08/17/18 1030 05/04/19 1133 05/26/19 1457  BNP 861.0* 778.0* 827.0*    CBG: Recent Labs  Lab 05/31/19 1156 05/31/19 1641 05/31/19 2119 06/01/19 0746 06/01/19 1154  GLUCAP 189* 163* 179* 151* 140*    Recent Results (from the past 240 hour(s))  Culture, blood (routine x 2)     Status: Abnormal   Collection Time: 05/26/19  2:57 PM   Specimen: BLOOD  Result Value Ref Range Status   Specimen Description   Final    BLOOD BLOOD LEFT FOREARM Performed at [facility], [address], [place], [place] [address]    Special Requests   Final    BOTTLES DRAWN  AEROBIC AND ANAEROBIC Blood Culture results may not be optimal due to an inadequate volume of blood received in culture bottles Performed at [facility], [address] [place], [place] [address]    Culture  Setup Time   Final    GRAM POSITIVE COCCI IN BOTH AEROBIC AND ANAEROBIC BOTTLES CRITICAL RESULT CALLED TO, READ BACK BY AND VERIFIED WITH: DAVID BESANTI 05/27/19 AT 0237 HS    Culture (A)  Final    STREPTOCOCCUS GROUP C SUSCEPTIBILITIES PERFORMED ON PREVIOUS CULTURE WITHIN THE LAST 5 DAYS. Performed at [facility], [address], [place], [place] [address]    Report Status 05/29/2019 FINAL  Final  Culture, blood (routine x 2)     Status: Abnormal   Collection Time: 05/26/19  2:57 PM   Specimen: BLOOD  Result Value Ref Range Status   Specimen Description   Final    BLOOD LEFT ANTECUBITAL Performed at [facility], [address], [place], [place] [address]    Special Requests   Final    BOTTLES DRAWN AEROBIC AND ANAEROBIC Blood Culture adequate volume Performed at [facility], [address], [place], [place] [address]    Culture  Setup Time   Final    GRAM POSITIVE COCCI IN BOTH AEROBIC AND ANAEROBIC BOTTLES CRITICAL VALUE NOTED.  VALUE IS CONSISTENT WITH PREVIOUSLY REPORTED AND CALLED VALUE. Performed at [facility], [place], [place] [address]    Culture STREPTOCOCCUS GROUP C (A)  Final   Report Status 05/29/2019 FINAL  Final   Organism ID, Bacteria STREPTOCOCCUS GROUP C  Final      Susceptibility   Streptococcus group c - MIC*    CLINDAMYCIN <=0.25 SENSITIVE Sensitive     AMPICILLIN <=0.25 SENSITIVE Sensitive     ERYTHROMYCIN <=0.12 SENSITIVE Sensitive     VANCOMYCIN 0.25 SENSITIVE Sensitive     CEFTRIAXONE <=0.12 SENSITIVE Sensitive     LEVOFLOXACIN 0.5 SENSITIVE Sensitive     PENICILLIN Value in next row Sensitive      SENSITIVE<=0.06    * STREPTOCOCCUS GROUP C  Respiratory Panel by RT PCR (Flu  A&B, Covid) - Nasopharyngeal Swab     Status: None   Collection Time: 05/26/19  2:57 PM   Specimen: Nasopharyngeal Swab  Result Value Ref Range Status   SARS Coronavirus 2 by RT PCR NEGATIVE NEGATIVE Final    Comment: (NOTE) SARS-CoV-2 target nucleic acids are NOT DETECTED. The SARS-CoV-2 RNA is generally detectable in upper respiratoy specimens during the acute phase of infection. The lowest concentration of SARS-CoV-2 viral copies this assay can detect is 131 copies/mL. A negative result does not  preclude SARS-Cov-2 infection and should not be used as the sole basis for treatment or other patient management decisions. A negative result may occur with  improper specimen collection/handling, submission of specimen other than nasopharyngeal swab, presence of viral mutation(s) within the areas targeted by this assay, and inadequate number of viral copies (<131 copies/mL). A negative result must be combined with clinical observations, patient history, and epidemiological information. The expected result is Negative. Fact Sheet for Patients:  PinkCheek.be Fact Sheet for Healthcare Providers:  GravelBags.it This test is not yet ap proved or cleared by the [place] FDA and  has been authorized for detection and/or diagnosis of SARS-CoV-2 by FDA under an Emergency Use Authorization (EUA). This EUA will remain  in effect (meaning this test can be used) for the duration of the COVID-19 declaration under Section 564(b)(1) of the Act, 21 U.S.C. section 360bbb-3(b)(1), unless the authorization is terminated or revoked sooner.    Influenza A by PCR NEGATIVE NEGATIVE Final   Influenza B by PCR NEGATIVE NEGATIVE Final    Comment: (NOTE) The Xpert Xpress SARS-CoV-2/FLU/RSV assay is intended as an aid in  the diagnosis of influenza from Nasopharyngeal swab specimens and  should not be used as a sole basis for treatment. Nasal washings  and  aspirates are unacceptable for Xpert Xpress SARS-CoV-2/FLU/RSV  testing. Fact Sheet for Patients: PinkCheek.be Fact Sheet for Healthcare Providers: GravelBags.it This test is not yet approved or cleared by the [place] FDA and  has been authorized for detection and/or diagnosis of SARS-CoV-2 by  FDA under an Emergency Use Authorization (EUA). This EUA will remain  in effect (meaning this test can be used) for the duration of the  Covid-19 declaration under Section 564(b)(1) of the Act, 21  U.S.C. section 360bbb-3(b)(1), unless the authorization is  terminated or revoked. Performed at [facility], [address] [place], [place] [address]   Blood Culture ID Panel (Reflexed)     Status: Abnormal   Collection Time: 05/26/19  2:57 PM  Result Value Ref Range Status   Enterococcus species NOT DETECTED NOT DETECTED Final   Listeria monocytogenes NOT DETECTED NOT DETECTED Final   Staphylococcus species NOT DETECTED NOT DETECTED Final   Staphylococcus aureus (BCID) NOT DETECTED NOT DETECTED Final   Streptococcus species DETECTED (A) NOT DETECTED Final    Comment: Not Enterococcus species, Streptococcus agalactiae, Streptococcus pyogenes, or Streptococcus pneumoniae. CRITICAL RESULT CALLED TO, READ BACK BY AND VERIFIED WITH: DAVID BESANTI 05/27/19 AT 0237 HS    Streptococcus agalactiae NOT DETECTED NOT DETECTED Final   Streptococcus pneumoniae NOT DETECTED NOT DETECTED Final   Streptococcus pyogenes NOT DETECTED NOT DETECTED Final   Acinetobacter baumannii NOT DETECTED NOT DETECTED Final   Enterobacteriaceae species NOT DETECTED NOT DETECTED Final   Enterobacter cloacae complex NOT DETECTED NOT DETECTED Final   Escherichia coli NOT DETECTED NOT DETECTED Final   Klebsiella oxytoca NOT DETECTED NOT DETECTED Final   Klebsiella pneumoniae NOT DETECTED NOT DETECTED Final   Proteus species NOT DETECTED NOT DETECTED  Final   Serratia marcescens NOT DETECTED NOT DETECTED Final   Haemophilus influenzae NOT DETECTED NOT DETECTED Final   Neisseria meningitidis NOT DETECTED NOT DETECTED Final   Pseudomonas aeruginosa NOT DETECTED NOT DETECTED Final   Candida albicans NOT DETECTED NOT DETECTED Final   Candida glabrata NOT DETECTED NOT DETECTED Final   Candida krusei NOT DETECTED NOT DETECTED Final   Candida parapsilosis NOT DETECTED NOT DETECTED Final   Candida tropicalis NOT DETECTED NOT DETECTED  Final    Comment: Performed at [facility], [address] [place], [place] [address]  Culture, blood (x 2)     Status: None   Collection Time: 05/27/19  6:54 PM   Specimen: BLOOD  Result Value Ref Range Status   Specimen Description BLOOD BLOOD RIGHT HAND  Final   Special Requests   Final    BOTTLES DRAWN AEROBIC AND ANAEROBIC Blood Culture adequate volume   Culture   Final    NO GROWTH 5 DAYS Performed at [facility], [address] [place], [place] [address]    Report Status 06/01/2019 FINAL  Final  Culture, blood (x 2)     Status: None   Collection Time: 05/27/19  6:56 PM   Specimen: BLOOD  Result Value Ref Range Status   Specimen Description BLOOD BLOOD LEFT HAND  Final   Special Requests   Final    BOTTLES DRAWN AEROBIC AND ANAEROBIC Blood Culture results may not be optimal due to an inadequate volume of blood received in culture bottles   Culture   Final    NO GROWTH 5 DAYS Performed at [facility], [address], [place], [place] [address]    Report Status 06/01/2019 FINAL  Final     Scheduled Meds: . ammonium lactate  1 application Topical BID  . vitamin C  250 mg Oral BID  . atorvastatin  40 mg Oral Daily  . Chlorhexidine Gluconate Cloth  6 each Topical Daily  . enoxaparin (LOVENOX) injection  40 mg Subcutaneous Q12H  . feeding supplement (ENSURE ENLIVE)  237 mL Oral BID  . feeding supplement (PRO-STAT SUGAR FREE 64)  30 mL Oral BID  . ferrous sulfate   325 mg Oral BID WC  . fluticasone  2 spray Each Nare Daily  . folic acid  1 mg Oral Daily  . insulin aspart  0-9 Units Subcutaneous TID WC  . lactulose  20 g Oral TID  . metroNIDAZOLE  500 mg Oral Q8H  . mirtazapine  30 mg Oral QHS  . multivitamin with minerals  1 tablet Oral Daily  . sodium chloride flush  10-40 mL Intracatheter Q12H   Continuous Infusions: . sodium chloride 20 mL/hr at 05/31/19 1600  . cefTRIAXone (ROCEPHIN)  IV 2 g (06/01/19 1228)    Assessment/Plan:  1. Sepsis with group C streptococcus with acute kidney injury.  Present on admission.  Case discussed with infectious disease and they changed antibiotics over to Rocephin.  TEE negative for vegetations.  PICC line.  Repeat blood cultures negative.  CT scan of the abdomen was unrevealing.  Infectious disease recommended podiatry consultation for her right foot and ankle as a possible source of infection.  MRI ordered by podiatry.  White blood cell count still very elevated 24.2. 2. Anemia of chronic disease.  Hemoglobin responded well to blood transfusion.  Hemoglobin at 8.3 today. 3. Acute metabolic encephalopathy.  Ammonia level only 17. Patient on lactulose. 4. Hypotension.  Blood pressure better off antihypertensive medication 5. Acute kidney injury on chronic kidney disease stage IIIb.  Creatinine peaked at at 2.37 and has come down to 1.95 today. 6. Pressure injury distal thigh, present on admission. 7. Bilateral lower extremity wounds.  Continue antibiotics 8. 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  Patient on sliding scale insulin 9. Hyperlipidemia on Lipitor 10. Immobility  Pressure Injury 05/05/19 Thigh Distal;Left;Posterior Unstageable - Full thickness tissue loss in which the base of the injury is  covered by slough (yellow, tan, gray, green or brown) and/or eschar (tan, brown or black) in the wound bed. 4"x4" area with m (Active)  05/05/19 0700  Location: Thigh  Location  Orientation: Distal;Left;Posterior  Staging: Unstageable - Full thickness tissue loss in which the base of the injury is covered by slough (yellow, tan, gray, green or brown) and/or eschar (tan, brown or black) in the wound bed.  Wound Description (Comments): 4"x4" area with mixture of pink granulated wound bed and yellow slough.  Present on Admission: Yes       Code Status:     Code Status Orders  (From admission, onward)         Start     Ordered   05/28/19 1030  Full code  Continuous     05/28/19 1029        Code Status History    Date Active Date Inactive Code Status Order ID Comments User Context   05/04/2019 1534 05/10/2019 2332 Full Code [id number]  Debbe Odea, MD ED   02/23/2019 2229 02/27/2019 1835 Full Code [id number]  Lenore Cordia, MD ED   12/16/2018 1704 01/02/2019 1845 Full Code [id number]  Eugenie Filler, MD ED   11/20/2018 1812 12/08/2018 0528 Full Code [id number]  Modena Nunnery D, DO ED   10/26/2018 0048 11/08/2018 0023 Full Code [id number]  Lang Snow, NP ED   08/17/2018 1820 08/22/2018 1805 Full Code [id number]  Dustin Flock, MD Inpatient   06/20/2018 2312 06/27/2018 2119 Full Code [id number]  Mayer Camel, NP ED   01/13/2018 1540 01/16/2018 1859 Full Code [id number]  Loletha Grayer, MD ED   11/03/2017 1758 11/06/2017 2136 Full Code [id number]  Salary, Avel Peace, MD Inpatient   09/26/2017 2353 09/30/2017 1404 Full Code [id number]  Lance Coon, MD Inpatient   08/27/2017 0347 08/29/2017 2054 Full Code [id number]  Harrie Foreman, MD Inpatient   08/07/2017 1704 08/09/2017 2237 Full Code [id number]  Saundra Shelling, MD Inpatient   12/18/2016 1146 12/26/2016 2032 Full Code [id number]  Vaughan Basta, MD Inpatient   12/18/2016 0959 12/18/2016 1146 Full Code [id number]  Vaughan Basta, MD ED   12/04/2015 0509 12/07/2015 1756 Full Code [id number]  Holley Raring, NP ED   10/28/2015 1640 10/29/2015 1711 Full Code [id number]  Bettey Costa, MD  Inpatient   08/29/2015 1438 08/30/2015 1750 Full Code [id number]  Hugelmeyer, Ubaldo Glassing, DO Inpatient   Advance Care Planning Activity     Family Communication: Finally able to speak with the husband at [phone].  He states that his cell phone is [phone]. Disposition Plan: Status is: Inpatient  Dispo: The patient is from: Home              Anticipated d/c is to: Patient agreeable to go to rehab              Anticipated d/c date is: Depending on progress may be 06/04/2019.              Patient currently switched over to IV Rocephin.  White blood cell count still high.  MRI of the right foot and ankle.  Consultants:  Infectious disease  Palliative care  Podiatry  Antibiotics:  Rocephin  Time spent: 28 minutes.  Case discussed with podiatry.  [place]  [facility] [employer]

## 2019-06-01 NOTE — Progress Notes (Signed)
Peripherally Inserted Central Catheter Placement  The IV Nurse has discussed with the patient and/or persons authorized to consent for the patient, the purpose of this procedure and the potential benefits and risks involved with this procedure.  The benefits include less needle sticks, lab draws from the catheter, and the patient may be discharged home with the catheter. Risks include, but not limited to, infection, bleeding, blood clot (thrombus formation), and puncture of an artery; nerve damage and irregular heartbeat and possibility to perform a PICC exchange if needed/ordered by physician.  Alternatives to this procedure were also discussed.  Bard Power PICC patient education guide, fact sheet on infection prevention and patient information card has been provided to patient /or left at bedside.    PICC Placement Documentation  PICC Single Lumen 06/01/19 PICC Right Brachial 41 cm 1 cm (Active)  Indication for Insertion or Continuance of Line Home intravenous therapies (PICC only) 06/01/19 0848  Exposed Catheter (cm) 1 cm 06/01/19 0848  Site Assessment Clean;Dry;Intact 06/01/19 0848  Line Status Flushed;Saline locked;Blood return noted 06/01/19 0848  Dressing Type Transparent;Securing device;Other (Comment) 06/01/19 0848  Dressing Status Clean;Dry;Intact;Antimicrobial disc in place 06/01/19 0848  Safety Lock Not Applicable 20/25/42 7062  Line Care Connections checked and tightened 06/01/19 0848  Line Adjustment (NICU/IV Team Only) No 06/01/19 0848  Dressing Intervention New dressing 06/01/19 0848  Dressing Change Due 06/08/19 06/01/19 0848       [name] 06/01/2019, 9:04 AM

## 2019-06-02 DIAGNOSIS — Z794 Long term (current) use of insulin: Secondary | ICD-10-CM

## 2019-06-02 DIAGNOSIS — N1832 Chronic kidney disease, stage 3b: Secondary | ICD-10-CM

## 2019-06-02 DIAGNOSIS — E1121 Type 2 diabetes mellitus with diabetic nephropathy: Secondary | ICD-10-CM

## 2019-06-02 LAB — CBC
HCT: 26.2 % — ABNORMAL LOW (ref 36.0–46.0)
Hemoglobin: 7.8 g/dL — ABNORMAL LOW (ref 12.0–15.0)
MCH: 22.3 pg — ABNORMAL LOW (ref 26.0–34.0)
MCHC: 29.8 g/dL — ABNORMAL LOW (ref 30.0–36.0)
MCV: 75.1 fL — ABNORMAL LOW (ref 80.0–100.0)
Platelets: 186 10*3/uL (ref 150–400)
RBC: 3.49 MIL/uL — ABNORMAL LOW (ref 3.87–5.11)
RDW: 24.5 % — ABNORMAL HIGH (ref 11.5–15.5)
WBC: 25.2 10*3/uL — ABNORMAL HIGH (ref 4.0–10.5)
nRBC: 0.1 % (ref 0.0–0.2)

## 2019-06-02 LAB — CK: Total CK: 49 U/L (ref 38–234)

## 2019-06-02 LAB — GLUCOSE, CAPILLARY
Glucose-Capillary: 131 mg/dL — ABNORMAL HIGH (ref 70–99)
Glucose-Capillary: 155 mg/dL — ABNORMAL HIGH (ref 70–99)
Glucose-Capillary: 159 mg/dL — ABNORMAL HIGH (ref 70–99)
Glucose-Capillary: 148 mg/dL — ABNORMAL HIGH (ref 70–99)
Glucose-Capillary: 151 mg/dL — ABNORMAL HIGH (ref 70–99)
Glucose-Capillary: 136 mg/dL — ABNORMAL HIGH (ref 70–99)

## 2019-06-02 LAB — BASIC METABOLIC PANEL
Anion gap: 10 (ref 5–15)
BUN: 40 mg/dL — ABNORMAL HIGH (ref 8–23)
CO2: 23 mmol/L (ref 22–32)
Calcium: 8.1 mg/dL — ABNORMAL LOW (ref 8.9–10.3)
Chloride: 106 mmol/L (ref 98–111)
Creatinine, Ser: 1.83 mg/dL — ABNORMAL HIGH (ref 0.44–1.00)
GFR calc Af Amer: 33 mL/min — ABNORMAL LOW (ref >60)
GFR calc non Af Amer: 29 mL/min — ABNORMAL LOW (ref >60)
Glucose, Bld: 168 mg/dL — ABNORMAL HIGH (ref 70–99)
Potassium: 4.7 mmol/L (ref 3.5–5.1)
Sodium: 139 mmol/L (ref 135–145)

## 2019-06-02 MED ORDER — FUROSEMIDE 20 MG PO TABS
20.0000 mg | ORAL_TABLET | Freq: Every day | ORAL | Status: DC
  Administered 2019-06-03 – 2019-06-06 (×4): 20 mg via ORAL
  Filled 2019-06-02 (×4): qty 1

## 2019-06-02 MED ORDER — POLYETHYLENE GLYCOL 3350 17 G PO PACK
17.0000 g | PACK | Freq: Every day | ORAL | Status: DC
  Administered 2019-06-02: 17 g via ORAL
  Filled 2019-06-02: qty 1

## 2019-06-02 NOTE — Progress Notes (Addendum)
This RN attempted to change wounds on bilateral legs. Patient had taken pain medication previously to dressing change. Patient very tender to touch and stated "don't touch my legs! I'm going to kick you in the face!" while using explicit language. This RN stated the importance of the dressing change and patient stated "I don't care!". Patient ultimately refused to be touched.

## 2019-06-02 NOTE — Progress Notes (Signed)
Patient ID: [name], female   DOB: 1956/01/08, 64 y.o.   MRN: [id number] [facility] PROGRESS NOTE  TALIN ROZEBOOM RDE:[id number] DOB: 1955/05/10 DOA: 05/26/2019 PCP: [place]  HPI/Subjective: Patient answers a few yes or no questions.  No pain in her legs unless you touch them.  Feels weak.  No cough or shortness of breath.  Objective: Vitals:   06/02/19 0732 06/02/19 1140  BP: 125/67 128/65  Pulse: 85 83  Resp: 17 17  Temp: 98.5 F (36.9 C) 98.4 F (36.9 C)  SpO2: 97% 96%    Intake/Output Summary (Last 24 hours) at 06/02/2019 1411 Last data filed at 06/02/2019 0100 Gross per 24 hour  Intake --  Output 200 ml  Net -200 ml   Filed Weights   05/31/19 0523 06/01/19 0427 06/02/19 0442  Weight: 119 kg 118.9 kg 118.1 kg    ROS: Review of Systems  Constitutional: Positive for malaise/fatigue. Negative for fever.  Eyes: Negative for blurred vision.  Respiratory: Negative for cough and shortness of breath.   Cardiovascular: Negative for chest pain.  Gastrointestinal: Negative for abdominal pain, nausea and vomiting.  Genitourinary: Negative for dysuria.  Musculoskeletal: Positive for joint pain.  Neurological: Negative for dizziness.   Exam: Physical Exam  HENT:  Nose: No mucosal edema.  Mouth/Throat: No oropharyngeal exudate or posterior oropharyngeal edema.  Eyes: Conjunctivae and lids are normal.  Neck: Carotid bruit is not present.  Cardiovascular: S1 normal and S2 normal. Exam reveals no gallop.  No murmur heard. Respiratory: No respiratory distress. She has decreased breath sounds in the right lower field and the left lower field. She has no wheezes. She has no rhonchi. She has no rales.  GI: Soft. Bowel sounds are normal. There is no abdominal tenderness.  Musculoskeletal:     Right ankle: Swelling present.     Left ankle: Swelling present.  Lymphadenopathy:    She has no cervical adenopathy.  Neurological: She is alert.  Patient  able to straight leg raise today.  Skin: Skin is warm. No rash noted. Nails show no clubbing.  Both legs covered with pressure dressings.  Psychiatric: She has a normal mood and affect.      Data Reviewed: Basic Metabolic Panel: Recent Labs  Lab 05/29/19 0422 05/30/19 0437 05/31/19 0513 06/01/19 0557 06/02/19 0746  NA 135 136 136 136 139  K 4.7 4.7 4.7 4.5 4.7  CL 103 103 103 104 106  CO2 22 23 24 24 23   GLUCOSE 132* 169* 218* 159* 168*  BUN 46* 52* 51* 44* 40*  CREATININE 2.39* 2.37* 2.14* 1.95* 1.83*  CALCIUM 8.0* 7.8* 8.1* 8.0* 8.1*   CBC: Recent Labs  Lab 05/26/19 1457 05/26/19 1457 05/27/19 0530 05/27/19 0530 05/30/19 0437 05/30/19 0535 05/31/19 0513 06/01/19 0557 06/02/19 0746  WBC 6.9   < > 8.9   < > 18.5* 19.6* 21.7* 24.2* 25.2*  NEUTROABS 6.5  --  8.3*  --   --   --   --   --   --   HGB 7.8*   < > 7.3*   < > 6.7* 6.8* 8.9* 8.3* 7.8*  HCT 27.1*   < > 25.5*   < > 23.8* 23.8* 29.5* 28.1* 26.2*  MCV 75.9*   < > 75.7*   < > 74.8* 74.6* 73.9* 75.5* 75.1*  PLT 179   < > 152   < > 193 189 200 198 186   < > = values in this interval not  displayed.   Cardiac Enzymes: Recent Labs  Lab 05/26/19 1457 06/02/19 0746  CKTOTAL 162 49   BNP (last 3 results) Recent Labs    08/17/18 1030 05/04/19 1133 05/26/19 1457  BNP 861.0* 778.0* 827.0*    CBG: Recent Labs  Lab 06/01/19 1154 06/01/19 1614 06/01/19 2119 06/02/19 0731 06/02/19 1139  GLUCAP 140* 132* 131* 155* 159*    Recent Results (from the past 240 hour(s))  Culture, blood (routine x 2)     Status: Abnormal   Collection Time: 05/26/19  2:57 PM   Specimen: BLOOD  Result Value Ref Range Status   Specimen Description   Final    BLOOD BLOOD LEFT FOREARM Performed at [facility], [address] [place], [place] [address]    Special Requests   Final    BOTTLES DRAWN AEROBIC AND ANAEROBIC Blood Culture results may not be optimal due to an inadequate volume of blood received in culture  bottles Performed at [facility], [address], [place], [name] [address]    Culture  Setup Time   Final    GRAM POSITIVE COCCI IN BOTH AEROBIC AND ANAEROBIC BOTTLES CRITICAL RESULT CALLED TO, READ BACK BY AND VERIFIED WITH: DAVID BESANTI 05/27/19 AT 0237 HS    Culture (A)  Final    STREPTOCOCCUS GROUP C SUSCEPTIBILITIES PERFORMED ON PREVIOUS CULTURE WITHIN THE LAST 5 DAYS. Performed at [facility], [place] [address], [place], [place] [address]    Report Status 05/29/2019 FINAL  Final  Culture, blood (routine x 2)     Status: Abnormal   Collection Time: 05/26/19  2:57 PM   Specimen: BLOOD  Result Value Ref Range Status   Specimen Description   Final    BLOOD LEFT ANTECUBITAL Performed at [facility], [address] [place], [place] [address]    Special Requests   Final    BOTTLES DRAWN AEROBIC AND ANAEROBIC Blood Culture adequate volume Performed at [facility], [address] [place], [place] [address]    Culture  Setup Time   Final    GRAM POSITIVE COCCI IN BOTH AEROBIC AND ANAEROBIC BOTTLES CRITICAL VALUE NOTED.  VALUE IS CONSISTENT WITH PREVIOUSLY REPORTED AND CALLED VALUE. Performed at [facility], [place], [place] [address]    Culture STREPTOCOCCUS GROUP C (A)  Final   Report Status 05/29/2019 FINAL  Final   Organism ID, Bacteria STREPTOCOCCUS GROUP C  Final      Susceptibility   Streptococcus group c - MIC*    CLINDAMYCIN <=0.25 SENSITIVE Sensitive     AMPICILLIN <=0.25 SENSITIVE Sensitive     ERYTHROMYCIN <=0.12 SENSITIVE Sensitive     VANCOMYCIN 0.25 SENSITIVE Sensitive     CEFTRIAXONE <=0.12 SENSITIVE Sensitive     LEVOFLOXACIN 0.5 SENSITIVE Sensitive     PENICILLIN Value in next row Sensitive      SENSITIVE<=0.06    * STREPTOCOCCUS GROUP C  Respiratory Panel by RT PCR (Flu A&B, Covid) - Nasopharyngeal Swab     Status: None   Collection Time: 05/26/19  2:57 PM   Specimen: Nasopharyngeal Swab   Result Value Ref Range Status   SARS Coronavirus 2 by RT PCR NEGATIVE NEGATIVE Final    Comment: (NOTE) SARS-CoV-2 target nucleic acids are NOT DETECTED. The SARS-CoV-2 RNA is generally detectable in upper respiratoy specimens during the acute phase of infection. The lowest concentration of SARS-CoV-2 viral copies this assay can detect is 131 copies/mL. A negative result does  not preclude SARS-Cov-2 infection and should not be used as the sole basis for treatment or other patient management decisions. A negative result may occur with  improper specimen collection/handling, submission of specimen other than nasopharyngeal swab, presence of viral mutation(s) within the areas targeted by this assay, and inadequate number of viral copies (<131 copies/mL). A negative result must be combined with clinical observations, patient history, and epidemiological information. The expected result is Negative. Fact Sheet for Patients:  PinkCheek.be Fact Sheet for Healthcare Providers:  GravelBags.it This test is not yet ap proved or cleared by the [place] FDA and  has been authorized for detection and/or diagnosis of SARS-CoV-2 by FDA under an Emergency Use Authorization (EUA). This EUA will remain  in effect (meaning this test can be used) for the duration of the COVID-19 declaration under Section 564(b)(1) of the Act, 21 U.S.C. section 360bbb-3(b)(1), unless the authorization is terminated or revoked sooner.    Influenza A by PCR NEGATIVE NEGATIVE Final   Influenza B by PCR NEGATIVE NEGATIVE Final    Comment: (NOTE) The Xpert Xpress SARS-CoV-2/FLU/RSV assay is intended as an aid in  the diagnosis of influenza from Nasopharyngeal swab specimens and  should not be used as a sole basis for treatment. Nasal washings and  aspirates are unacceptable for Xpert Xpress SARS-CoV-2/FLU/RSV  testing. Fact Sheet for  Patients: PinkCheek.be Fact Sheet for Healthcare Providers: GravelBags.it This test is not yet approved or cleared by the [place] FDA and  has been authorized for detection and/or diagnosis of SARS-CoV-2 by  FDA under an Emergency Use Authorization (EUA). This EUA will remain  in effect (meaning this test can be used) for the duration of the  Covid-19 declaration under Section 564(b)(1) of the Act, 21  U.S.C. section 360bbb-3(b)(1), unless the authorization is  terminated or revoked. Performed at [facility], [address] [place], [place] [address]   Blood Culture ID Panel (Reflexed)     Status: Abnormal   Collection Time: 05/26/19  2:57 PM  Result Value Ref Range Status   Enterococcus species NOT DETECTED NOT DETECTED Final   Listeria monocytogenes NOT DETECTED NOT DETECTED Final   Staphylococcus species NOT DETECTED NOT DETECTED Final   Staphylococcus aureus (BCID) NOT DETECTED NOT DETECTED Final   Streptococcus species DETECTED (A) NOT DETECTED Final    Comment: Not Enterococcus species, Streptococcus agalactiae, Streptococcus pyogenes, or Streptococcus pneumoniae. CRITICAL RESULT CALLED TO, READ BACK BY AND VERIFIED WITH: DAVID BESANTI 05/27/19 AT 0237 HS    Streptococcus agalactiae NOT DETECTED NOT DETECTED Final   Streptococcus pneumoniae NOT DETECTED NOT DETECTED Final   Streptococcus pyogenes NOT DETECTED NOT DETECTED Final   Acinetobacter baumannii NOT DETECTED NOT DETECTED Final   Enterobacteriaceae species NOT DETECTED NOT DETECTED Final   Enterobacter cloacae complex NOT DETECTED NOT DETECTED Final   Escherichia coli NOT DETECTED NOT DETECTED Final   Klebsiella oxytoca NOT DETECTED NOT DETECTED Final   Klebsiella pneumoniae NOT DETECTED NOT DETECTED Final   Proteus species NOT DETECTED NOT DETECTED Final   Serratia marcescens NOT DETECTED NOT DETECTED Final   Haemophilus influenzae NOT  DETECTED NOT DETECTED Final   Neisseria meningitidis NOT DETECTED NOT DETECTED Final   Pseudomonas aeruginosa NOT DETECTED NOT DETECTED Final   Candida albicans NOT DETECTED NOT DETECTED Final   Candida glabrata NOT DETECTED NOT DETECTED Final   Candida krusei NOT DETECTED NOT DETECTED Final   Candida parapsilosis NOT DETECTED NOT DETECTED Final   Candida tropicalis NOT DETECTED NOT  DETECTED Final    Comment: Performed at [facility], [address] [place], [place] [address]  Culture, blood (x 2)     Status: None   Collection Time: 05/27/19  6:54 PM   Specimen: BLOOD  Result Value Ref Range Status   Specimen Description BLOOD BLOOD RIGHT HAND  Final   Special Requests   Final    BOTTLES DRAWN AEROBIC AND ANAEROBIC Blood Culture adequate volume   Culture   Final    NO GROWTH 5 DAYS Performed at [facility], [address] [place], [place] [address]    Report Status 06/01/2019 FINAL  Final  Culture, blood (x 2)     Status: None   Collection Time: 05/27/19  6:56 PM   Specimen: BLOOD  Result Value Ref Range Status   Specimen Description BLOOD BLOOD LEFT HAND  Final   Special Requests   Final    BOTTLES DRAWN AEROBIC AND ANAEROBIC Blood Culture results may not be optimal due to an inadequate volume of blood received in culture bottles   Culture   Final    NO GROWTH 5 DAYS Performed at [facility], [address], [place], [place] [address]    Report Status 06/01/2019 FINAL  Final     Scheduled Meds: . ammonium lactate  1 application Topical BID  . vitamin C  250 mg Oral BID  . atorvastatin  40 mg Oral Daily  . Chlorhexidine Gluconate Cloth  6 each Topical Daily  . feeding supplement (ENSURE ENLIVE)  237 mL Oral BID  . feeding supplement (PRO-STAT SUGAR FREE 64)  30 mL Oral BID  . ferrous sulfate  325 mg Oral BID WC  . fluticasone  2 spray Each Nare Daily  . folic acid  1 mg Oral Daily  . [START ON 06/03/2019] furosemide  20 mg Oral Daily  .  insulin aspart  0-9 Units Subcutaneous TID WC  . lactulose  20 g Oral TID  . metroNIDAZOLE  500 mg Oral Q8H  . mirtazapine  30 mg Oral QHS  . multivitamin with minerals  1 tablet Oral Daily  . sodium chloride flush  10-40 mL Intracatheter Q12H   Continuous Infusions: . sodium chloride 20 mL/hr at 05/31/19 1600  . cefTRIAXone (ROCEPHIN)  IV 2 g (06/02/19 1159)    Assessment/Plan:  1. Sepsis with group C streptococcus with acute kidney injury.  Present on admission.  Case discussed with infectious disease and they changed antibiotics over to Rocephin.  TEE negative for vegetations.  Repeat blood cultures negative.  CT scan of the abdomen was unrevealing.  Right ankle and foot MRI negative for osteomyelitis but does show soft tissue and muscle swelling.  CPK normal range.  White blood cell count still high.  Continue antibiotics. 2. Anemia of chronic disease.  Patient given 1 unit of packed red blood cells during the hospital course which responded well but now hemoglobin drifting down again to 7.8. 3. Acute metabolic encephalopathy.  Ammonia level only 17. Patient on lactulose. 4. Hypotension.  Blood pressure now normal off antihypertensives. 5. Acute kidney injury on chronic kidney disease stage IIIb.  Creatinine peaked at at 2.37 and has come down to 1.83. Will start low-dose Lasix tomorrow morning. 6. Pressure injury distal thigh, present on admission. 7. Bilateral lower extremity wounds.  Continue antibiotics 8. 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  Patient on sliding scale insulin 9. Hyperlipidemia on Lipitor 10. Immobility  Pressure Injury 05/05/19  Thigh Distal;Left;Posterior Unstageable - Full thickness tissue loss in which the base of the injury is covered by slough (yellow, tan, gray, green or brown) and/or eschar (tan, brown or black) in the wound bed. 4"x4" area with m (Active)  05/05/19 0700  Location: Thigh  Location Orientation:  Distal;Left;Posterior  Staging: Unstageable - Full thickness tissue loss in which the base of the injury is covered by slough (yellow, tan, gray, green or brown) and/or eschar (tan, brown or black) in the wound bed.  Wound Description (Comments): 4"x4" area with mixture of pink granulated wound bed and yellow slough.  Present on Admission: Yes       Code Status:     Code Status Orders  (From admission, onward)         Start     Ordered   05/28/19 1030  Full code  Continuous     05/28/19 1029        Code Status History    Date Active Date Inactive Code Status Order ID Comments User Context   05/04/2019 1534 05/10/2019 2332 Full Code [id number]  Debbe Odea, MD ED   02/23/2019 2229 02/27/2019 1835 Full Code [id number]  Lenore Cordia, MD ED   12/16/2018 1704 01/02/2019 1845 Full Code [id number]  Eugenie Filler, MD ED   11/20/2018 1812 12/08/2018 0528 Full Code [id number]  Modena Nunnery D, DO ED   10/26/2018 0048 11/08/2018 0023 Full Code [id number]  Lang Snow, NP ED   08/17/2018 1820 08/22/2018 1805 Full Code [id number]  Dustin Flock, MD Inpatient   06/20/2018 2312 06/27/2018 2119 Full Code [id number]  Mayer Camel, NP ED   01/13/2018 1540 01/16/2018 1859 Full Code [id number]  Loletha Grayer, MD ED   11/03/2017 1758 11/06/2017 2136 Full Code [id number]  Salary, Avel Peace, MD Inpatient   09/26/2017 2353 09/30/2017 1404 Full Code [id number]  Lance Coon, MD Inpatient   08/27/2017 0347 08/29/2017 2054 Full Code [id number]  Harrie Foreman, MD Inpatient   08/07/2017 1704 08/09/2017 2237 Full Code [id number]  Saundra Shelling, MD Inpatient   12/18/2016 1146 12/26/2016 2032 Full Code [id number]  Vaughan Basta, MD Inpatient   12/18/2016 0959 12/18/2016 1146 Full Code [id number]  Vaughan Basta, MD ED   12/04/2015 0509 12/07/2015 1756 Full Code [id number]  Holley Raring, NP ED   10/28/2015 1640 10/29/2015 1711 Full Code [id number]  Bettey Costa, MD Inpatient    08/29/2015 1438 08/30/2015 1750 Full Code [id number]  Hugelmeyer, Ubaldo Glassing, DO Inpatient   Advance Care Planning Activity     Family Communication: Spoke with with the husband at [phone] again today.  He states that his cell phone is [phone]. Disposition Plan: Status is: Inpatient  Dispo: The patient is from: Home              Anticipated d/c is to: Patient agreeable to go to rehab              Anticipated d/c date is: Depending on progress may be 06/04/2019.              Patient currently switched over to IV Rocephin.  White blood cell count still high.  We will touch base with infectious disease specialist on Monday to determine how long to watch here in the hospital.  Consultants:  Infectious disease  Palliative care  Podiatry  Antibiotics:  Rocephin  Time spent: 27 minutes.  [place]  [facility] [employer]

## 2019-06-03 LAB — GLUCOSE, CAPILLARY
Glucose-Capillary: 135 mg/dL — ABNORMAL HIGH (ref 70–99)
Glucose-Capillary: 146 mg/dL — ABNORMAL HIGH (ref 70–99)
Glucose-Capillary: 154 mg/dL — ABNORMAL HIGH (ref 70–99)
Glucose-Capillary: 148 mg/dL — ABNORMAL HIGH (ref 70–99)

## 2019-06-03 LAB — CBC WITH DIFFERENTIAL/PLATELET
Abs Immature Granulocytes: 0.21 10*3/uL — ABNORMAL HIGH (ref 0.00–0.07)
Basophils Absolute: 0.1 10*3/uL (ref 0.0–0.1)
Basophils Relative: 0 %
Eosinophils Absolute: 0.1 10*3/uL (ref 0.0–0.5)
Eosinophils Relative: 1 %
HCT: 27.2 % — ABNORMAL LOW (ref 36.0–46.0)
Hemoglobin: 8.1 g/dL — ABNORMAL LOW (ref 12.0–15.0)
Immature Granulocytes: 1 %
Lymphocytes Relative: 4 %
Lymphs Abs: 0.7 10*3/uL (ref 0.7–4.0)
MCH: 22.3 pg — ABNORMAL LOW (ref 26.0–34.0)
MCHC: 29.8 g/dL — ABNORMAL LOW (ref 30.0–36.0)
MCV: 74.9 fL — ABNORMAL LOW (ref 80.0–100.0)
Monocytes Absolute: 0.8 10*3/uL (ref 0.1–1.0)
Monocytes Relative: 4 %
Neutro Abs: 18.4 10*3/uL — ABNORMAL HIGH (ref 1.7–7.7)
Neutrophils Relative %: 90 %
Platelets: 185 10*3/uL (ref 150–400)
RBC: 3.63 MIL/uL — ABNORMAL LOW (ref 3.87–5.11)
RDW: 24.8 % — ABNORMAL HIGH (ref 11.5–15.5)
WBC: 20.3 10*3/uL — ABNORMAL HIGH (ref 4.0–10.5)
nRBC: 0.1 % (ref 0.0–0.2)

## 2019-06-03 LAB — C-REACTIVE PROTEIN: CRP: 16 mg/dL — ABNORMAL HIGH (ref <1.0)

## 2019-06-03 LAB — SEDIMENTATION RATE: Sed Rate: 126 mm/hr — ABNORMAL HIGH (ref 0–30)

## 2019-06-03 LAB — BASIC METABOLIC PANEL
Anion gap: 11 (ref 5–15)
BUN: 35 mg/dL — ABNORMAL HIGH (ref 8–23)
CO2: 24 mmol/L (ref 22–32)
Calcium: 8.3 mg/dL — ABNORMAL LOW (ref 8.9–10.3)
Chloride: 105 mmol/L (ref 98–111)
Creatinine, Ser: 1.84 mg/dL — ABNORMAL HIGH (ref 0.44–1.00)
GFR calc Af Amer: 33 mL/min — ABNORMAL LOW (ref >60)
GFR calc non Af Amer: 28 mL/min — ABNORMAL LOW (ref >60)
Glucose, Bld: 159 mg/dL — ABNORMAL HIGH (ref 70–99)
Potassium: 4.6 mmol/L (ref 3.5–5.1)
Sodium: 140 mmol/L (ref 135–145)

## 2019-06-03 MED ORDER — HYDROMORPHONE HCL 1 MG/ML IJ SOLN
1.0000 mg | Freq: Once | INTRAMUSCULAR | Status: AC
  Administered 2019-06-03: 1 mg via INTRAVENOUS
  Filled 2019-06-03: qty 1

## 2019-06-03 MED ORDER — OXYCODONE HCL 5 MG PO TABS
10.0000 mg | ORAL_TABLET | ORAL | Status: DC | PRN
  Administered 2019-06-03 – 2019-06-06 (×7): 10 mg via ORAL
  Filled 2019-06-03 (×7): qty 2

## 2019-06-03 MED ORDER — SODIUM CHLORIDE 0.9 % IV SOLN
3.0000 g | Freq: Four times a day (QID) | INTRAVENOUS | Status: DC
  Administered 2019-06-03 – 2019-06-05 (×7): 3 g via INTRAVENOUS
  Filled 2019-06-03 (×3): qty 8
  Filled 2019-06-03: qty 3
  Filled 2019-06-03 (×4): qty 8
  Filled 2019-06-03: qty 3
  Filled 2019-06-03: qty 8

## 2019-06-03 NOTE — Consult Note (Signed)
PODIATRY / FOOT AND ANKLE SURGERY PROGRESS NOTE  Requesting Physician: Loletha Grayer, MD  Reason for consult: Bilateral lower extremity swelling, right foot and ankle redness, sepsis  Chief Complaint: Bilateral leg pain   HPI: [name] is a 64 y.o. female who presents resting in bed comfortably but still complaining of right lower extremity pain.  Patient states that she has been able to tolerate the compressive dressing that was applied to her right foot.  She has not had that dressing change since she was seen Saturday.  Orders have been placed with wound care to apply bilateral lower extremity Unna boots.  Patient does state though that she is feeling better today and is more communicative.  PMHx:  Past Medical History:  Diagnosis Date  . Cervical cancer ([place])   . CHF (congestive heart failure) ([place])   . Chronic anemia   . COPD (chronic obstructive pulmonary disease) ([place])   . Diabetes mellitus without complication ([place])   . Gastric ulcer   . NSTEMI (non-ST elevated myocardial infarction) ([place])   . Upper GI bleed     Surgical Hx:  Past Surgical History:  Procedure Laterality Date  . AMPUTATION Left 06/24/2018   Procedure: AMPUTATION GREAT TOE;  Surgeon: Samara Deist, DPM;  Location: [facility];  Service: Podiatry;  Laterality: Left;  . APPENDECTOMY    . BIOPSY  11/22/2018   Procedure: BIOPSY;  Surgeon: Thornton Park, MD;  Location: [name];  Service: Gastroenterology;;  . CARDIAC CATHETERIZATION    . CHOLECYSTECTOMY    . COLONOSCOPY N/A 09/28/2017   Procedure: COLONOSCOPY;  Surgeon: Toledo, Benay Pike, MD;  Location: [facility]COPY;  Service: Gastroenterology;  Laterality: N/A;  . COLONOSCOPY WITH PROPOFOL N/A 08/21/2018   Procedure: COLONOSCOPY WITH PROPOFOL;  Surgeon: Lin Landsman, MD;  Location: [facility] ENDOSCOPY;  Service: Gastroenterology;  Laterality: N/A;  . COLONOSCOPY WITH PROPOFOL N/A 12/28/2018   Procedure: COLONOSCOPY WITH PROPOFOL;  Surgeon:  Rush Landmark Telford Nab., MD;  Location: [facility];  Service: Gastroenterology;  Laterality: N/A;  . ENTEROSCOPY Left 11/06/2017   Procedure: ENTEROSCOPY;  Surgeon: Jonathon Bellows, MD;  Location: [facility] ENDOSCOPY;  Service: Gastroenterology;  Laterality: Left;  . ENTEROSCOPY N/A 06/22/2018   Procedure: ENTEROSCOPY;  Surgeon: Lin Landsman, MD;  Location: [facility] ENDOSCOPY;  Service: Gastroenterology;  Laterality: N/A;  . ENTEROSCOPY N/A 08/18/2018   Procedure: ENTEROSCOPY;  Surgeon: Jonathon Bellows, MD;  Location: [facility] ENDOSCOPY;  Service: Gastroenterology;  Laterality: N/A;  . ENTEROSCOPY N/A 11/01/2018   Procedure: ENTEROSCOPY;  Surgeon: Virgel Manifold, MD;  Location: [facility] ENDOSCOPY;  Service: Endoscopy;  Laterality: N/A;  . ENTEROSCOPY N/A 11/22/2018   Procedure: ENTEROSCOPY;  Surgeon: Thornton Park, MD;  Location: [place];  Service: Gastroenterology;  Laterality: N/A;  . ENTEROSCOPY Left 12/17/2018   Procedure: ENTEROSCOPY;  Surgeon: Yetta Flock, MD;  Location: [facility];  Service: Gastroenterology;  Laterality: Left;  . ENTEROSCOPY N/A 12/19/2018   Procedure: ENTEROSCOPY;  Surgeon: Yetta Flock, MD;  Location: [facility];  Service: Gastroenterology;  Laterality: N/A;  . ENTEROSCOPY N/A 12/20/2018   Procedure: ENTEROSCOPY;  Surgeon: Lavena Bullion, DO;  Location: [facility];  Service: Gastroenterology;  Laterality: N/A;  . ESOPHAGOGASTRODUODENOSCOPY N/A 12/19/2016   Procedure: ESOPHAGOGASTRODUODENOSCOPY (EGD);  Surgeon: Lin Landsman, MD;  Location: [facility] ENDOSCOPY;  Service: Gastroenterology;  Laterality: N/A;  . ESOPHAGOGASTRODUODENOSCOPY N/A 09/28/2017   Procedure: ESOPHAGOGASTRODUODENOSCOPY (EGD);  Surgeon: Toledo, Benay Pike, MD;  Location: [facility] ENDOSCOPY;  Service: Gastroenterology;  Laterality: N/A;  . ESOPHAGOGASTRODUODENOSCOPY (EGD)  WITH PROPOFOL N/A 08/08/2017   Procedure: ESOPHAGOGASTRODUODENOSCOPY (EGD) WITH PROPOFOL;  Surgeon: [name], MD;   Location: [facility] ENDOSCOPY;  Service: Endoscopy;  Laterality: N/A;  . GIVENS CAPSULE STUDY N/A 10/28/2018   Procedure: GIVENS CAPSULE STUDY;  Surgeon: Jonathon Bellows, MD;  Location: [facility] ENDOSCOPY;  Service: Gastroenterology;  Laterality: N/A;  . GIVENS CAPSULE STUDY N/A 12/26/2018   Procedure: GIVENS CAPSULE STUDY;  Surgeon: Rush Landmark Telford Nab., MD;  Location: [facility];  Service: Gastroenterology;  Laterality: N/A;  . [place]    . HEMOSTASIS CLIP PLACEMENT  12/19/2018   Procedure: HEMOSTASIS CLIP PLACEMENT;  Surgeon: Yetta Flock, MD;  Location[facility];  Service: Gastroenterology;;  . HEMOSTASIS CLIP PLACEMENT  12/28/2018   Procedure: HEMOSTASIS CLIP PLACEMENT;  Surgeon: Irving Copas., MD;  Location: [facility];  Service: Gastroenterology;;  . HOT HEMOSTASIS N/A 12/17/2018   Procedure: HOT HEMOSTASIS (ARGON PLASMA COAGULATION/BICAP);  Surgeon: Yetta Flock, MD;  Location: [facility];  Service: Gastroenterology;  Laterality: N/A;  . HOT HEMOSTASIS N/A 12/28/2018   Procedure: HOT HEMOSTASIS (ARGON PLASMA COAGULATION/BICAP);  Surgeon: Irving Copas., MD;  Location: [facility];  Service: Gastroenterology;  Laterality: N/A;  . LEFT HEART CATH AND CORONARY ANGIOGRAPHY N/A 12/23/2016   Procedure: LEFT HEART CATH AND CORONARY ANGIOGRAPHY;  Surgeon: Teodoro Spray, MD;  Location: [place] CV LAB;  Service: Cardiovascular;  Laterality: N/A;  . LOWER EXTREMITY ANGIOGRAPHY Left 06/26/2018   Procedure: Lower Extremity Angiography;  Surgeon: Katha Cabal, MD;  Location: [place] [facility];  Service: Cardiovascular;  Laterality: Left;  . SUBMUCOSAL INJECTION  12/19/2018   Procedure: SUBMUCOSAL INJECTION;  Surgeon: Yetta Flock, MD;  Location: [facility];  Service: Gastroenterology;;  . Lia Foyer TATTOO INJECTION  12/17/2018   Procedure: SUBMUCOSAL TATTOO INJECTION;  Surgeon: Yetta Flock, MD;  [facility];  Service: Gastroenterology;;  . Lia Foyer TATTOO INJECTION  12/28/2018   Procedure: SUBMUCOSAL TATTOO INJECTION;  Surgeon: Irving Copas., MD;  Location: [facility];  Service: Gastroenterology;;  . TEE WITHOUT CARDIOVERSION N/A 10/29/2018   Procedure: TRANSESOPHAGEAL ECHOCARDIOGRAM (TEE);  Surgeon: Teodoro Spray, MD;  Location: [facility];  Service: Cardiovascular;  Laterality: N/A;  . TEE WITHOUT CARDIOVERSION N/A 05/30/2019   Procedure: TRANSESOPHAGEAL ECHOCARDIOGRAM (TEE);  Surgeon: Teodoro Spray, MD;  Location: [facility];  Service: Cardiovascular;  Laterality: N/A;    FHx:  Family History  Problem Relation Age of Onset  . Hypertension Mother   . Diabetes Mellitus II Mother   . Breast cancer Mother   . Heart disease Father   . Cancer Sister     Social History:  reports that she has been smoking cigarettes. She has been smoking about 0.25 packs per day. She has never used smokeless tobacco. She reports that she does not drink alcohol or use drugs.  Allergies:  Allergies  Allergen Reactions  . Latex Anaphylaxis and Rash  . Gabapentin Nausea And Vomiting and Other (See Comments)    Review of Systems: General ROS: positive for  - chills and fatigue Respiratory ROS: positive for - shortness of breath Cardiovascular ROS: no chest pain or dyspnea on exertion Gastrointestinal ROS: no abdominal pain, change in bowel habits, or black or bloody stools Musculoskeletal ROS: positive for - gait disturbance, joint pain, joint stiffness, joint swelling, muscle pain and muscular weakness Neurological ROS: positive for - numbness/tingling Dermatological ROS: Several superficial venous stasis type ulcerations to bilateral lower extremities, pressure ulcers heels.  Medications Prior  to Admission  Medication Sig Dispense Refill  . Amino Acids-Protein Hydrolys (FEEDING SUPPLEMENT, PRO-STAT SUGAR FREE 64,) LIQD Take 30 mLs by mouth 2 (two) times daily. 887 mL 0  . ammonium  lactate (AMLACTIN) 12 % cream Apply 1 g topically 2 (two) times daily.    [name] Kitchen atorvastatin (LIPITOR) 40 MG tablet Take 40 mg by mouth daily.    . carvedilol (COREG) 3.125 MG tablet Take 1 tablet (3.125 mg total) by mouth 2 (two) times daily with a meal. 60 tablet 0  . collagenase (SANTYL) ointment Apply topically daily. 15 g 0  . ferrous sulfate 325 (65 FE) MG tablet Take 1 tablet (325 mg total) by mouth 2 (two) times daily with a meal. 60 tablet 3  . fluticasone (FLONASE) 50 MCG/ACT nasal spray Place 2 sprays into both nostrils daily.    . folic acid (FOLVITE) 1 MG tablet Take 1 tablet (1 mg total) by mouth daily. 30 tablet 0  . lactulose (CHRONULAC) 10 GM/15ML solution Take 30 mLs (20 g total) by mouth 3 (three) times daily. 236 mL 0  . mirtazapine (REMERON) 30 MG tablet Take 30 mg by mouth at bedtime.    . Multiple Vitamin (MULTIVITAMIN WITH MINERALS) TABS tablet Take 1 tablet by mouth daily. 30 tablet 0  . Nutritional Supplements (RESOURCE 2.0 PO) Take 1 Bottle by mouth 2 (two) times daily.    [name] Kitchen senna-docusate (SENOKOT-S) 8.6-50 MG tablet Take 1 tablet by mouth at bedtime as needed for mild constipation. 30 tablet 0  . spironolactone (ALDACTONE) 25 MG tablet Take 12.5 mg by mouth daily.    [name] Kitchen torsemide (DEMADEX) 20 MG tablet Take 1 tablet (20 mg total) by mouth daily. 30 tablet 1  . pregabalin (LYRICA) 150 MG capsule Take 150 mg by mouth 2 (two) times daily.      Physical Exam: General: Alert and oriented.  No apparent distress.  Vascular: DP/PT pulses nonpalpable bilateral.  Severe swelling to bilateral lower extremities, appears to be nonpitting.  Neuro: Light touch sensation reduced to digits bilaterally.  Derm: Multiple venous stasis type blisters to both lower extremities extending from the calf level to the foot along with associated extreme severe swelling to bilateral lower extremities.  Deep tissue type injuries to the posterior aspects of both heels likely consistent with pressure  ulcerations forming.  No obvious areas of fluctuance present to bilateral lower extremities but difficult to examine due to patient's pain.  MSK: Extreme pain on palpation to the right foot and ankle as well as calf.  Pain on palpation to the left posterior heel.  0/5 MSK strength of bilateral lower extremities.  Results for orders placed or performed during the hospital encounter of 05/26/19 (from the past 48 hour(s))  Glucose, capillary     Status: Abnormal   Collection Time: 06/01/19  4:14 PM  Result Value Ref Range   Glucose-Capillary 132 (H) 70 - 99 mg/dL    Comment: Glucose reference range applies only to samples taken after fasting for at least 8 hours.  Glucose, capillary     Status: Abnormal   Collection Time: 06/01/19  9:19 PM  Result Value Ref Range   Glucose-Capillary 131 (H) 70 - 99 mg/dL    Comment: Glucose reference range applies only to samples taken after fasting for at least 8 hours.  Glucose, capillary     Status: Abnormal   Collection Time: 06/02/19  7:31 AM  Result Value Ref Range   Glucose-Capillary 155 (H) 70 - 99  mg/dL    Comment: Glucose reference range applies only to samples taken after fasting for at least 8 hours.  Basic metabolic panel     Status: Abnormal   Collection Time: 06/02/19  7:46 AM  Result Value Ref Range   Sodium 139 135 - 145 mmol/L   Potassium 4.7 3.5 - 5.1 mmol/L   Chloride 106 98 - 111 mmol/L   CO2 23 22 - 32 mmol/L   Glucose, Bld 168 (H) 70 - 99 mg/dL    Comment: Glucose reference range applies only to samples taken after fasting for at least 8 hours.   BUN 40 (H) 8 - 23 mg/dL   Creatinine, Ser 1.83 (H) 0.44 - 1.00 mg/dL   Calcium 8.1 (L) 8.9 - 10.3 mg/dL   GFR calc non Af Amer 29 (L) >60 mL/min   GFR calc Af Amer 33 (L) >60 mL/min   Anion gap 10 5 - 15    Comment: Performed at [facility], [address] [place], [place] [address]  CBC     Status: Abnormal   Collection Time: 06/02/19  7:46 AM  Result Value Ref Range    WBC 25.2 (H) 4.0 - 10.5 K/uL    Comment: WHITE COUNT CONFIRMED ON SMEAR   RBC 3.49 (L) 3.87 - 5.11 MIL/uL   Hemoglobin 7.8 (L) 12.0 - 15.0 g/dL    Comment: Reticulocyte Hemoglobin testing may be clinically indicated, consider ordering this additional test [id number]    HCT 26.2 (L) 36.0 - 46.0 %   MCV 75.1 (L) 80.0 - 100.0 fL   MCH 22.3 (L) 26.0 - 34.0 pg   MCHC 29.8 (L) 30.0 - 36.0 g/dL   RDW 24.5 (H) 11.5 - 15.5 %   Platelets 186 150 - 400 K/uL   nRBC 0.1 0.0 - 0.2 %    Comment: Performed at [facility], [address] [place], [place] [address]  CK     Status: None   Collection Time: 06/02/19  7:46 AM  Result Value Ref Range   Total CK 49 38 - 234 U/L    Comment: Performed at [facility], [address] [place], [place] [address]  Glucose, capillary     Status: Abnormal   Collection Time: 06/02/19 11:39 AM  Result Value Ref Range   Glucose-Capillary 159 (H) 70 - 99 mg/dL    Comment: Glucose reference range applies only to samples taken after fasting for at least 8 hours.  Glucose, capillary     Status: Abnormal   Collection Time: 06/02/19  3:34 PM  Result Value Ref Range   Glucose-Capillary 148 (H) 70 - 99 mg/dL    Comment: Glucose reference range applies only to samples taken after fasting for at least 8 hours.  Glucose, capillary     Status: Abnormal   Collection Time: 06/02/19  4:27 PM  Result Value Ref Range   Glucose-Capillary 151 (H) 70 - 99 mg/dL    Comment: Glucose reference range applies only to samples taken after fasting for at least 8 hours.  Glucose, capillary     Status: Abnormal   Collection Time: 06/02/19  9:32 PM  Result Value Ref Range   Glucose-Capillary 136 (H) 70 - 99 mg/dL    Comment: Glucose reference range applies only to samples taken after fasting for at least 8 hours.  Basic metabolic panel     Status: Abnormal   Collection Time: 06/03/19  4:11 AM  Result Value Ref Range  Sodium 140 135 - 145 mmol/L   Potassium 4.6 3.5  - 5.1 mmol/L   Chloride 105 98 - 111 mmol/L   CO2 24 22 - 32 mmol/L   Glucose, Bld 159 (H) 70 - 99 mg/dL    Comment: Glucose reference range applies only to samples taken after fasting for at least 8 hours.   BUN 35 (H) 8 - 23 mg/dL   Creatinine, Ser 1.84 (H) 0.44 - 1.00 mg/dL   Calcium 8.3 (L) 8.9 - 10.3 mg/dL   GFR calc non Af Amer 28 (L) >60 mL/min   GFR calc Af Amer 33 (L) >60 mL/min   Anion gap 11 5 - 15    Comment: Performed at [facility], [address] [place], [place] [address]  CBC with Differential/Platelet     Status: Abnormal   Collection Time: 06/03/19  4:11 AM  Result Value Ref Range   WBC 20.3 (H) 4.0 - 10.5 K/uL   RBC 3.63 (L) 3.87 - 5.11 MIL/uL   Hemoglobin 8.1 (L) 12.0 - 15.0 g/dL    Comment: Reticulocyte Hemoglobin testing may be clinically indicated, consider ordering this additional test [id number]    HCT 27.2 (L) 36.0 - 46.0 %   MCV 74.9 (L) 80.0 - 100.0 fL   MCH 22.3 (L) 26.0 - 34.0 pg   MCHC 29.8 (L) 30.0 - 36.0 g/dL   RDW 24.8 (H) 11.5 - 15.5 %   Platelets 185 150 - 400 K/uL   nRBC 0.1 0.0 - 0.2 %   Neutrophils Relative % 90 %   Neutro Abs 18.4 (H) 1.7 - 7.7 K/uL   Lymphocytes Relative 4 %   Lymphs Abs 0.7 0.7 - 4.0 K/uL   Monocytes Relative 4 %   Monocytes Absolute 0.8 0.1 - 1.0 K/uL   Eosinophils Relative 1 %   Eosinophils Absolute 0.1 0.0 - 0.5 K/uL   Basophils Relative 0 %   Basophils Absolute 0.1 0.0 - 0.1 K/uL   WBC Morphology MORPHOLOGY UNREMARKABLE    Smear Review MORPHOLOGY UNREMARKABLE    Immature Granulocytes 1 %   Abs Immature Granulocytes 0.21 (H) 0.00 - 0.07 K/uL   Polychromasia PRESENT    Target Cells PRESENT     Comment: Performed at [facility], [address] [place], [place] [address]  Glucose, capillary     Status: Abnormal   Collection Time: 06/03/19  7:46 AM  Result Value Ref Range   Glucose-Capillary 135 (H) 70 - 99 mg/dL    Comment: Glucose reference range applies only to samples taken after  fasting for at least 8 hours.  Glucose, capillary     Status: Abnormal   Collection Time: 06/03/19 11:32 AM  Result Value Ref Range   Glucose-Capillary 146 (H) 70 - 99 mg/dL    Comment: Glucose reference range applies only to samples taken after fasting for at least 8 hours.   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 CONTRAST:  48m GADAVIST GADOBUTROL 1 MMOL/ML IV SOLN COMPARISON:  None. FINDINGS: Bones/Joint/Cartilage Normal osseous marrow signal is seen throughout. No areas of cortical destruction or periosteal reaction. No areas of abnormal enhancement. No large joint effusions. The articular surfaces appear to be maintained. Ligaments The Lisfranc ligaments are  intact. Muscles and Tendons There is diffuse feathery signal seen throughout the muscles surrounding the forefoot. No areas of abnormal enhancement are seen however within the muscles. The flexor and extensor tendons are intact. Soft tissues There is diffuse subcutaneous edema and skin thickening seen surrounding the forefoot. No focal area of ulceration or soft tissue abscess. No sinus tract are seen. IMPRESSION: No evidence of osteomyelitis or soft tissue abscess. Findings suggestive of diffuse muscular edema and cellulitis. Electronically Signed   By: Prudencio Pair M.D.   On: 06/01/2019 20:12   MR ANKLE RIGHT W WO CONTRAST  Result Date: 06/01/2019 CLINICAL DATA:  Foot swelling diabetic EXAM: MRI OF THE RIGHT ANKLE WITHOUT AND WITH CONTRAST TECHNIQUE: Multiplanar, multisequence MR imaging of the was performed following the administration of intravenous contrast. CONTRAST:  52m GADAVIST GADOBUTROL 1 MMOL/ML IV SOLN COMPARISON:  None. FINDINGS:  Bones/Joint/Cartilage There is surrounding metallic artifact from prior fixation at the distal tibia and fibula. Within the remainder of the osseous structures there is normal osseous marrow signal. No areas of cortical destruction or periosteal reaction. No large ankle or subtalar joint effusion. Ligaments Suboptimally visualized Muscles and Tendons There is diffuse muscular feathery signal seen throughout the ankle. No areas of abnormal enhancement. The visualized portions of the Achilles tendon is intact. There is somewhat limited visualization of the peroneal tendons, however the remainder of the flexor and extensor tendons are intact. Soft tissues Diffuse subcutaneous edema seen surrounding the ankle with skin thickening. No definite sinus tract or loculated fluid collection. IMPRESSION: Prior fixation of the ankle with surrounding metallic artifact. No definite evidence of osteomyelitis or soft tissue abscess. Findings suggestive of diffuse muscular edema and cellulitis. Electronically Signed   By: BPrudencio PairM.D.   On: 06/01/2019 20:36    Blood pressure (!) 111/47, pulse 81, temperature 97.9 F (36.6 C), resp. rate 20, height 5' 5"  (1.651 m), weight 119.1 kg, SpO2 99 %.  Assessment 1. Sepsis 2/2 right lower leg cellulitis likely 2/2 to venous stasis/swelling as well as multiple venous stasis blisters, concern for potential abscess 2. Diabetes type 2 polyneuropathy 3. PVD 4. Pressure ulcers both heels  Plan -Patient seen and examined -Previous x-rays reviewed and discussed with patient in detail.  No acute infection noted.  Previous right ankle ORIF with hardware intact no obvious signs of infection. -MRI results reviewed and discussed with patient in detail showing no deep abscess but cellulitic changes to the tissues. -Patient has extreme swelling to bilateral lower extremities along with erythema at the right foot that extends all the way up to the mid calf level.  Appears to be mildly  improved since last visit -Patient's previous venous duplex showed no signs of DVT but due to patient's body habitus this is difficult to examine. -Patient does not appear to have any deep wounds to both feet.  Patient has pressure ulcers that are starting to form at the posterior aspects of both heels from being nonweightbearing and stuck in bed or chair. -Patient to continue Prevalon boots and or pillows underneath heel areas to prevent further pressure ulcerations.  Applied Prevalon boot today in the room as it was sitting on the counter. -Consult placed for wound care team to apply Unna boots.  Would also recommend cleaning wounds with sterile saline and dry gauze then apply alginate to the wounds followed by Unna boot placement.  Patient needs these changed 2-3 times a week or sooner depending on drainage. -Appreciate ID recommendations for antibiotics.  Podiatry team to sign off at this time with appropriate follow-up.  Patient may be better suited to follow-up with [facility] wound care center.  Caroline More, DPM 06/03/2019, 1:14 PM

## 2019-06-03 NOTE — Progress Notes (Signed)
Patient ID: [name], female   DOB: January 07, 1956, 64 y.o.   MRN: [id number] [facility] PROGRESS NOTE  [name] TIR:[id number] DOB: 1955/08/23 DOA: 05/26/2019 PCP: [place]  HPI/Subjective: Patient answering yes or no questions.  Does not elaborate much.  She complains of pain in her leg as I was removing the sheet to look at her legs.  She still feels weak.  No cough or shortness of breath.  Objective: Vitals:   06/03/19 1131 06/03/19 1636  BP: (!) 111/47 (!) 107/53  Pulse: 81 83  Resp: 20 18  Temp: 97.9 F (36.6 C) 97.9 F (36.6 C)  SpO2: 99% 96%    Intake/Output Summary (Last 24 hours) at 06/03/2019 1650 Last data filed at 06/03/2019 1135 Gross per 24 hour  Intake 0 ml  Output 1300 ml  Net -1300 ml   Filed Weights   06/01/19 0427 06/02/19 0442 06/03/19 0543  Weight: 118.9 kg 118.1 kg 119.1 kg    ROS: Review of Systems  Constitutional: Negative for fever.  Eyes: Negative for blurred vision.  Respiratory: Negative for cough and shortness of breath.   Cardiovascular: Negative for chest pain.  Gastrointestinal: Negative for abdominal pain, nausea and vomiting.  Genitourinary: Negative for dysuria.  Musculoskeletal: Negative for joint pain.  Neurological: Negative for dizziness.   Exam: Physical Exam  HENT:  Nose: No mucosal edema.  Mouth/Throat: No oropharyngeal exudate or posterior oropharyngeal edema.  Eyes: Conjunctivae and lids are normal.  Neck: Carotid bruit is not present.  Cardiovascular: S1 normal and S2 normal. Exam reveals no gallop.  No murmur heard. Respiratory: No respiratory distress. She has decreased breath sounds in the right lower field and the left lower field. She has no wheezes. She has no rhonchi. She has no rales.  GI: Soft. Bowel sounds are normal. There is no abdominal tenderness.  Musculoskeletal:     Right ankle: Swelling present.     Left ankle: Swelling present.  Lymphadenopathy:    She has no cervical  adenopathy.  Neurological: She is alert. No cranial nerve deficit.  Skin: Skin is warm. Nails show no clubbing.  Unna boots on bilateral lower extremity.  Redness and swelling of feet and toes worse on the right.  Psychiatric: She has a normal mood and affect.      Data Reviewed: Basic Metabolic Panel: Recent Labs  Lab 05/30/19 0437 05/31/19 0513 06/01/19 0557 06/02/19 0746 06/03/19 0411  NA 136 136 136 139 140  K 4.7 4.7 4.5 4.7 4.6  CL 103 103 104 106 105  CO2 23 24 24 23 24   GLUCOSE 169* 218* 159* 168* 159*  BUN 52* 51* 44* 40* 35*  CREATININE 2.37* 2.14* 1.95* 1.83* 1.84*  CALCIUM 7.8* 8.1* 8.0* 8.1* 8.3*    Recent Labs  Lab 05/30/19 0437  AMMONIA 17   CBC: Recent Labs  Lab 05/30/19 0535 05/31/19 0513 06/01/19 0557 06/02/19 0746 06/03/19 0411  WBC 19.6* 21.7* 24.2* 25.2* 20.3*  NEUTROABS  --   --   --   --  18.4*  HGB 6.8* 8.9* 8.3* 7.8* 8.1*  HCT 23.8* 29.5* 28.1* 26.2* 27.2*  MCV 74.6* 73.9* 75.5* 75.1* 74.9*  PLT 189 200 198 186 185   Cardiac Enzymes: Recent Labs  Lab 06/02/19 0746  CKTOTAL 49   BNP (last 3 results) Recent Labs    08/17/18 1030 05/04/19 1133 05/26/19 1457  BNP 861.0* 778.0* 827.0*     CBG: Recent Labs  Lab 06/02/19 1627 06/02/19  2132 06/03/19 0746 06/03/19 1132 06/03/19 1636  GLUCAP 151* 136* 135* 146* 154*    Recent Results (from the past 240 hour(s))  Culture, blood (routine x 2)     Status: Abnormal   Collection Time: 05/26/19  2:57 PM   Specimen: BLOOD  Result Value Ref Range Status   Specimen Description   Final    BLOOD BLOOD LEFT FOREARM Performed at [facility], [address] [place], [place] [address]    Special Requests   Final    BOTTLES DRAWN AEROBIC AND ANAEROBIC Blood Culture results may not be optimal due to an inadequate volume of blood received in culture bottles Performed at [facility], [address] [place], [place] [address]    Culture  Setup Time   Final     GRAM POSITIVE COCCI IN BOTH AEROBIC AND ANAEROBIC BOTTLES CRITICAL RESULT CALLED TO, READ BACK BY AND VERIFIED WITH: DAVID BESANTI 05/27/19 AT 0237 HS    Culture (A)  Final    STREPTOCOCCUS GROUP C SUSCEPTIBILITIES PERFORMED ON PREVIOUS CULTURE WITHIN THE LAST 5 DAYS. Performed at [facility], [place] [address] [place], [place] [address]    Report Status 05/29/2019 FINAL  Final  Culture, blood (routine x 2)     Status: Abnormal   Collection Time: 05/26/19  2:57 PM   Specimen: BLOOD  Result Value Ref Range Status   Specimen Description   Final    BLOOD LEFT ANTECUBITAL Performed at [facility], [address] [place], [place] [address]    Special Requests   Final    BOTTLES DRAWN AEROBIC AND ANAEROBIC Blood Culture adequate volume Performed at [facility], [address], [place], [place] [address]    Culture  Setup Time   Final    GRAM POSITIVE COCCI IN BOTH AEROBIC AND ANAEROBIC BOTTLES CRITICAL VALUE NOTED.  VALUE IS CONSISTENT WITH PREVIOUSLY REPORTED AND CALLED VALUE. Performed at [facility], [place],  [address]    Culture STREPTOCOCCUS GROUP C (A)  Final   Report Status 05/29/2019 FINAL  Final   Organism ID, Bacteria STREPTOCOCCUS GROUP C  Final      Susceptibility   Streptococcus group c - MIC*    CLINDAMYCIN <=0.25 SENSITIVE Sensitive     AMPICILLIN <=0.25 SENSITIVE Sensitive     ERYTHROMYCIN <=0.12 SENSITIVE Sensitive     VANCOMYCIN 0.25 SENSITIVE Sensitive     CEFTRIAXONE <=0.12 SENSITIVE Sensitive     LEVOFLOXACIN 0.5 SENSITIVE Sensitive     PENICILLIN Value in next row Sensitive      SENSITIVE<=0.06    * STREPTOCOCCUS GROUP C  Respiratory Panel by RT PCR (Flu A&B, Covid) - Nasopharyngeal Swab     Status: None   Collection Time: 05/26/19  2:57 PM   Specimen: Nasopharyngeal Swab  Result Value Ref Range Status   SARS Coronavirus 2 by RT PCR NEGATIVE NEGATIVE Final    Comment: (NOTE) SARS-CoV-2 target  nucleic acids are NOT DETECTED. The SARS-CoV-2 RNA is generally detectable in upper respiratoy specimens during the acute phase of infection. The lowest concentration of SARS-CoV-2 viral copies this assay can detect is 131 copies/mL. A negative result does not preclude SARS-Cov-2 infection and should not be used as the sole basis for treatment or other patient management decisions. A negative result may occur with  improper specimen collection/handling, submission of specimen other than nasopharyngeal swab, presence of viral mutation(s) within the areas targeted by this assay, and inadequate  number of viral copies (<131 copies/mL). A negative result must be combined with clinical observations, patient history, and epidemiological information. The expected result is Negative. Fact Sheet for Patients:  PinkCheek.be Fact Sheet for Healthcare Providers:  GravelBags.it This test is not yet ap proved or cleared by the [place] FDA and  has been authorized for detection and/or diagnosis of SARS-CoV-2 by FDA under an Emergency Use Authorization (EUA). This EUA will remain  in effect (meaning this test can be used) for the duration of the COVID-19 declaration under Section 564(b)(1) of the Act, 21 U.S.C. section 360bbb-3(b)(1), unless the authorization is terminated or revoked sooner.    Influenza A by PCR NEGATIVE NEGATIVE Final   Influenza B by PCR NEGATIVE NEGATIVE Final    Comment: (NOTE) The Xpert Xpress SARS-CoV-2/FLU/RSV assay is intended as an aid in  the diagnosis of influenza from Nasopharyngeal swab specimens and  should not be used as a sole basis for treatment. Nasal washings and  aspirates are unacceptable for Xpert Xpress SARS-CoV-2/FLU/RSV  testing. Fact Sheet for Patients: PinkCheek.be Fact Sheet for Healthcare Providers: GravelBags.it This test is not  yet approved or cleared by the [place] FDA and  has been authorized for detection and/or diagnosis of SARS-CoV-2 by  FDA under an Emergency Use Authorization (EUA). This EUA will remain  in effect (meaning this test can be used) for the duration of the  Covid-19 declaration under Section 564(b)(1) of the Act, 21  U.S.C. section 360bbb-3(b)(1), unless the authorization is  terminated or revoked. Performed at [facility], [address] [place], [place] [address]   Blood Culture ID Panel (Reflexed)     Status: Abnormal   Collection Time: 05/26/19  2:57 PM  Result Value Ref Range Status   Enterococcus species NOT DETECTED NOT DETECTED Final   Listeria monocytogenes NOT DETECTED NOT DETECTED Final   Staphylococcus species NOT DETECTED NOT DETECTED Final   Staphylococcus aureus (BCID) NOT DETECTED NOT DETECTED Final   Streptococcus species DETECTED (A) NOT DETECTED Final    Comment: Not Enterococcus species, Streptococcus agalactiae, Streptococcus pyogenes, or Streptococcus pneumoniae. CRITICAL RESULT CALLED TO, READ BACK BY AND VERIFIED WITH: DAVID BESANTI 05/27/19 AT 3646 HS    Streptococcus agalactiae NOT DETECTED NOT DETECTED Final   Streptococcus pneumoniae NOT DETECTED NOT DETECTED Final   Streptococcus pyogenes NOT DETECTED NOT DETECTED Final   Acinetobacter baumannii NOT DETECTED NOT DETECTED Final   Enterobacteriaceae species NOT DETECTED NOT DETECTED Final   Enterobacter cloacae complex NOT DETECTED NOT DETECTED Final   Escherichia coli NOT DETECTED NOT DETECTED Final   Klebsiella oxytoca NOT DETECTED NOT DETECTED Final   Klebsiella pneumoniae NOT DETECTED NOT DETECTED Final   Proteus species NOT DETECTED NOT DETECTED Final   Serratia marcescens NOT DETECTED NOT DETECTED Final   Haemophilus influenzae NOT DETECTED NOT DETECTED Final   Neisseria meningitidis NOT DETECTED NOT DETECTED Final   Pseudomonas aeruginosa NOT DETECTED NOT DETECTED Final   Candida  albicans NOT DETECTED NOT DETECTED Final   Candida glabrata NOT DETECTED NOT DETECTED Final   Candida krusei NOT DETECTED NOT DETECTED Final   Candida parapsilosis NOT DETECTED NOT DETECTED Final   Candida tropicalis NOT DETECTED NOT DETECTED Final    Comment: Performed at [facility], [address] [place], [place] 08/24/2019  Culture, blood (x 2)     Status: None   Collection Time: 05/27/19  6:54 PM   Specimen: BLOOD  Result Value Ref Range Status   Specimen  Description BLOOD BLOOD RIGHT HAND  Final   Special Requests   Final    BOTTLES DRAWN AEROBIC AND ANAEROBIC Blood Culture adequate volume   Culture   Final    NO GROWTH 5 DAYS Performed at [facility], [address] [place], [place] [address]    Report Status 06/01/2019 FINAL  Final  Culture, blood (x 2)     Status: None   Collection Time: 05/27/19  6:56 PM   Specimen: BLOOD  Result Value Ref Range Status   Specimen Description BLOOD BLOOD LEFT HAND  Final   Special Requests   Final    BOTTLES DRAWN AEROBIC AND ANAEROBIC Blood Culture results may not be optimal due to an inadequate volume of blood received in culture bottles   Culture   Final    NO GROWTH 5 DAYS Performed at [facility], [address] [place], [place] [address]    Report Status 06/01/2019 FINAL  Final     Studies: MR FOOT RIGHT W WO CONTRAST  Result Date: 06/01/2019 CLINICAL DATA:  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 CONTRAST:  66m GADAVIST GADOBUTROL 1 MMOL/ML IV SOLN COMPARISON:  None. FINDINGS: Bones/Joint/Cartilage Normal osseous marrow signal is seen throughout. No areas of cortical destruction or periosteal reaction. No areas of abnormal enhancement. No large joint effusions. The  articular surfaces appear to be maintained. Ligaments The Lisfranc ligaments are intact. Muscles and Tendons There is diffuse feathery signal seen throughout the muscles surrounding the forefoot. No areas of abnormal enhancement are seen however within the muscles. The flexor and extensor tendons are intact. Soft tissues There is diffuse subcutaneous edema and skin thickening seen surrounding the forefoot. No focal area of ulceration or soft tissue abscess. No sinus tract are seen. IMPRESSION: No evidence of osteomyelitis or soft tissue abscess. Findings suggestive of diffuse muscular edema and cellulitis. Electronically Signed   By: BPrudencio PairM.D.   On: 06/01/2019 20:12   MR ANKLE RIGHT W WO CONTRAST  Result Date: 06/01/2019 CLINICAL DATA:  Foot swelling diabetic EXAM: MRI OF THE RIGHT ANKLE WITHOUT AND WITH CONTRAST TECHNIQUE: Multiplanar, multisequence MR imaging of the was performed following the administration of intravenous contrast. CONTRAST:  157mGADAVIST GADOBUTROL 1 MMOL/ML IV SOLN COMPARISON:  None. FINDINGS: Bones/Joint/Cartilage There is surrounding metallic artifact from prior fixation at the distal tibia and fibula. Within the remainder of the osseous structures there is normal osseous marrow signal. No areas of cortical destruction or periosteal reaction. No large ankle or subtalar joint effusion. Ligaments Suboptimally visualized Muscles and Tendons There is diffuse muscular feathery signal seen throughout the ankle. No areas of abnormal enhancement. The visualized portions of the Achilles tendon is intact. There is somewhat limited visualization of the peroneal tendons, however the remainder of the flexor and extensor tendons are intact. Soft tissues Diffuse subcutaneous edema seen surrounding the ankle with skin thickening. No definite sinus tract or loculated fluid collection. IMPRESSION: Prior fixation of the ankle with surrounding metallic artifact. No definite evidence of osteomyelitis  or soft tissue abscess. Findings suggestive of diffuse muscular edema and cellulitis. Electronically Signed   By: BiPrudencio Pair.D.   On: 06/01/2019 20:36    Scheduled Meds: . ammonium lactate  1 application Topical BID  . vitamin C  250 mg Oral BID  .  atorvastatin  40 mg Oral Daily  . Chlorhexidine Gluconate Cloth  6 each Topical Daily  . feeding supplement (ENSURE ENLIVE)  237 mL Oral BID  . feeding supplement (PRO-STAT SUGAR FREE 64)  30 mL Oral BID  . ferrous sulfate  325 mg Oral BID WC  . fluticasone  2 spray Each Nare Daily  . folic acid  1 mg Oral Daily  . furosemide  20 mg Oral Daily  . insulin aspart  0-9 Units Subcutaneous TID WC  . lactulose  20 g Oral TID  . mirtazapine  30 mg Oral QHS  . multivitamin with minerals  1 tablet Oral Daily  . sodium chloride flush  10-40 mL Intracatheter Q12H   Continuous Infusions: . sodium chloride 20 mL/hr at 05/31/19 1600  . ampicillin-sulbactam (UNASYN) IV      Assessment/Plan:  1. Sepsis with group C streptococcus with acute kidney injury.  This was present on admission.  Antibiotics were switched over to Unasyn today.  TEE showing no vegetations.  Repeat blood cultures are negative.  Patient has a PICC line.  CT scan of the abdomen not showing any signs of infection.  MRI of the right ankle negative for osteomyelitis but does show soft tissue swelling.  CPK normal range.  White blood cell count still high and sedimentation rate and CRP elevated.  Continue antibiotics.  Will likely need 4 weeks of IV antibiotics.  2. Anemia of chronic disease.  Patient given 1 unit of packed red blood cells during the hospital course.  Hemoglobin 8.1. 3. Acute metabolic encephalopathy.  Ammonia level only 17.  Patient on lactulose.  Mental status likely impaired secondary to sepsis. 4. Hypotension.  Blood pressure now normal off antihypertensives.  Restart low-dose Lasix. 5. Acute kidney injury on chronic kidney disease stage IIIb.  Creatinine peaked at  2.37 and has come down to 1.84.  Watch on low-dose Lasix. 6. Pressure injury posterior thigh present on admission 7. Bilateral lower extremity wounds and right leg swelling and erythema.  Continue antibiotics.  Podiatry ordered for Unna boots to be placed 8. 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  Patient on sliding scale insulin 9. Hyperlipidemia on Lipitor 10. Immobility  Pressure Injury 05/05/19 Thigh Distal;Left;Posterior Unstageable - Full thickness tissue loss in which the base of the injury is covered by slough (yellow, tan, gray, green or brown) and/or eschar (tan, brown or black) in the wound bed. 4"x4" area with m (Active)  05/05/19 0700  Location: Thigh  Location Orientation: Distal;Left;Posterior  Staging: Unstageable - Full thickness tissue loss in which the base of the injury is covered by slough (yellow, tan, gray, green or brown) and/or eschar (tan, brown or black) in the wound bed.  Wound Description (Comments): 4"x4" area with mixture of pink granulated wound bed and yellow slough.  Present on Admission: Yes       Code Status:     Code Status Orders  (From admission, onward)         Start     Ordered   05/28/19 1030  Full code  Continuous     05/28/19 1029        Code Status History    Date Active Date Inactive Code Status Order ID Comments User Context   05/04/2019 1534 05/10/2019 2332 Full Code [id number]  Debbe Odea, MD ED   02/23/2019 2229 02/27/2019 1835 Full Code [id number]  Lenore Cordia, MD ED   12/16/2018 1704 01/02/2019 1845 Full Code  [id number]  Eugenie Filler, MD ED   11/20/2018 1812 12/08/2018 0528 Full Code [id number]  Modena Nunnery D, DO ED   10/26/2018 0048 11/08/2018 0023 Full Code [id number]  Lang Snow, NP ED   08/17/2018 1820 08/22/2018 1805 Full Code [id number]  Dustin Flock, MD Inpatient   06/20/2018 2312 06/27/2018 2119 Full Code [id number]  Mayer Camel, NP ED   01/13/2018 1540 01/16/2018 1859  Full Code [id number]  Loletha Grayer, MD ED   11/03/2017 1758 11/06/2017 2136 Full Code [id number]  Salary, Avel Peace, MD Inpatient   09/26/2017 2353 09/30/2017 1404 Full Code [id number]  Lance Coon, MD Inpatient   08/27/2017 0347 08/29/2017 2054 Full Code [id number]  Harrie Foreman, MD Inpatient   08/07/2017 1704 08/09/2017 2237 Full Code [id number]  Saundra Shelling, MD Inpatient   12/18/2016 1146 12/26/2016 2032 Full Code [id number]  Vaughan Basta, MD Inpatient   12/18/2016 0959 12/18/2016 1146 Full Code [id number]  Vaughan Basta, MD ED   12/04/2015 0509 12/07/2015 1756 Full Code [id number]  Holley Raring, NP ED   10/28/2015 1640 10/29/2015 1711 Full Code [id number]  Bettey Costa, MD Inpatient   08/29/2015 1438 08/30/2015 1750 Full Code [id number]  Hugelmeyer, Ubaldo Glassing, DO Inpatient   Advance Care Planning Activity     Family Communication: spoke with husband on the phone at [phone].  Patient cell phone is [phone]. Disposition Plan: Status is: Inpatient  Dispo: The patient is from: Home              Anticipated d/c is to: Rehab              Anticipated d/c date is: When we are able to obtain a rehab bed.  Can potentially go as early as 06/04/2019              Patient currently receiving IV antibiotics.  Patient has a PICC line and will require total of likely 4 weeks of IV antibiotics.  Infectious disease change antibiotics again today.  Check white blood cell count again tomorrow.  Consultants:  Infectious disease  Podiatry  Procedures:  PICC line  Antibiotics:  Antibiotics changed over to Unasyn  Time spent: 27 minutes, case discussed with infectious disease  Toshi Ishii [employer]

## 2019-06-03 NOTE — Progress Notes (Signed)
ID  Pt c/o pain rt leg Some itching Patient Vitals for the past 24 hrs:  BP Temp Temp src Pulse Resp SpO2 Weight  06/03/19 1131 (!) 111/47 97.9 F (36.6 C) -- 81 20 99 % --  06/03/19 0745 (!) 112/59 97.9 F (36.6 C) Oral 84 18 97 % --  06/03/19 0543 135/66 98.5 F (36.9 C) Oral 83 16 94 % 119.1 kg  06/02/19 1958 135/65 98.8 F (37.1 C) -- 87 14 92 % --  06/02/19 1533 123/84 98.5 F (36.9 C) -- 86 17 95 % --  \ More alert Chest b/l air entry [id number] abd soft anasarca  Edema legs upto hips Legs in unna boot  CBC Latest Ref Rng & Units 06/03/2019 06/02/2019 06/01/2019  WBC 4.0 - 10.5 K/uL 20.3(H) 25.2(H) 24.2(H)  Hemoglobin 12.0 - 15.0 g/dL 8.1(L) 7.8(L) 8.3(L)  Hematocrit 36.0 - 46.0 % 27.2(L) 26.2(L) 28.1(L)  Platelets 150 - 400 K/uL 185 186 198     CMP Latest Ref Rng & Units 06/03/2019 06/02/2019 06/01/2019  Glucose 70 - 99 mg/dL 159(H) 168(H) 159(H)  BUN 8 - 23 mg/dL 35(H) 40(H) 44(H)  Creatinine 0.44 - 1.00 mg/dL 1.84(H) 1.83(H) 1.95(H)  Sodium 135 - 145 mmol/L 140 139 136  Potassium 3.5 - 5.1 mmol/L 4.6 4.7 4.5  Chloride 98 - 111 mmol/L 105 106 104  CO2 22 - 32 mmol/L 24 23 24   Calcium 8.9 - 10.3 mg/dL 8.3(L) 8.1(L) 8.0(L)  Total Protein 6.5 - 8.1 g/dL - - -  Total Bilirubin 0.3 - 1.2 mg/dL - - -  Alkaline Phos 38 - 126 U/L - - -  AST 15 - 41 U/L - - -  ALT 0 - 44 U/L - - -   Prior fixation of the ankle with surrounding metallic artifact. No definite evidence of osteomyelitis or soft tissue abscess.  Findings suggestive of diffuse muscular edema and cellulitis.  Impression/Recommendation  Group C streptococcus bacteremia : this is a skin/GI/oral organisms. Likely source Rt leg -She had same bacteremia last year. 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

## 2019-06-03 NOTE — Consult Note (Addendum)
Pharmacy Antibiotic Note  [name] is a 64 y.o. female admitted on 05/26/2019 with bacteremia.  Pharmacy has been consulted for Unasyn dosing.  Day 9 of abx. ceftriaxone and doxycycline > switched to PCN G 18MU/24 CI changed back to ceftriaxone + flagyl > switched to unasyn.  TEE - no vegetations. ID following.  Podiatry consulted - MRI - no osteomyelitis or soft tissue abscess.  - f/u with repeat Bcx on 5/10 NGTD -  5/9 BCx STREPTOCOCCUS GROUP C  Plan: Unasyn 3 g q6H  Height: 5' 5"  (165.1 cm) Weight: 119.1 kg (262 lb 9.1 oz) IBW/kg (Calculated) : 57  Temp (24hrs), Avg:98.3 F (36.8 C), Min:97.9 F (36.6 C), Max:98.8 F (37.1 C)  Recent Labs  Lab 05/27/19 1855 05/27/19 2209 05/28/19 7482 05/30/19 0437 05/30/19 0437 05/30/19 0535 05/31/19 0513 06/01/19 0557 06/02/19 0746 06/03/19 0411  WBC  --   --   --  18.5*   < > 19.6* 21.7* 24.2* 25.2* 20.3*  CREATININE  --   --    < > 2.37*  --   --  2.14* 1.95* 1.83* 1.84*  LATICACIDVEN 2.9* 2.7*  --   --   --   --   --   --   --   --    < > = values in this interval not displayed.    Estimated Creatinine Clearance: 39.9 mL/min (A) (by C-G formula based on SCr of 1.84 mg/dL (H)).    Allergies  Allergen Reactions  . Latex Anaphylaxis and Rash  . Gabapentin Nausea And Vomiting and Other (See Comments)    Thank you for allowing pharmacy to be a part of this patient's care.  Oswald Hillock, PharmD, BCPS 06/03/2019 2:05 PM

## 2019-06-03 NOTE — Consult Note (Signed)
[facility] Nurse Consult Note: Reason for Consult:Order for unna boots to be placed to bilateral lower legs and wound care to "superficial venous wounds and bilateral heel pressure injuries" as noted by vascular team.  Legs are wrapped in Unna boots by nurse this AM.  I will reassess Thursday if still in house.  Wound type:venous and pressure, as noted by vascular  Pressure Injury POA: NA Measurement:not assessed  Legs already wrapped  Wound bed:not assessed Drainage (amount, consistency, odor) not assessed Periwound:edema to bilateral lower legs.  Dressing procedure/placement/frequency: Cleanse legs with soap and water and pat dry.  Apply alginate to nonintact wounds to legs and heels.  Wrap with kerlix and secure with coban. Change Monday and Thursday.  Will not follow at this time.  Please re-consult if needed.  Domenic Moras MSN, RN, FNP-BC CWON Wound, Ostomy, Continence Nurse Pager [phone]

## 2019-06-03 NOTE — Discharge Instructions (Signed)
Recommend Unna boot dressings to be placed every 2 to 3 days depending on drainage.  Recommend cleansing wounds to bilateral lower extremities with saline and gauze and then applying silver alginate to the venous stasis ulcerations, then apply 4 x 4 gauze, Kerlix, Unna boot, web roll, Coban from the toes to just below the knee.  ABDs can also be applied if patient is having increased drainage.  Recommend the patient follow-up with [facility] wound care center within the next week after discharge.

## 2019-06-04 ENCOUNTER — Inpatient Hospital Stay: Payer: Self-pay

## 2019-06-04 LAB — BASIC METABOLIC PANEL
Anion gap: 8 (ref 5–15)
BUN: 33 mg/dL — ABNORMAL HIGH (ref 8–23)
CO2: 26 mmol/L (ref 22–32)
Calcium: 8.1 mg/dL — ABNORMAL LOW (ref 8.9–10.3)
Chloride: 106 mmol/L (ref 98–111)
Creatinine, Ser: 1.82 mg/dL — ABNORMAL HIGH (ref 0.44–1.00)
GFR calc Af Amer: 33 mL/min — ABNORMAL LOW (ref >60)
GFR calc non Af Amer: 29 mL/min — ABNORMAL LOW (ref >60)
Glucose, Bld: 182 mg/dL — ABNORMAL HIGH (ref 70–99)
Potassium: 4.8 mmol/L (ref 3.5–5.1)
Sodium: 140 mmol/L (ref 135–145)

## 2019-06-04 LAB — CBC
HCT: 27.4 % — ABNORMAL LOW (ref 36.0–46.0)
Hemoglobin: 7.8 g/dL — ABNORMAL LOW (ref 12.0–15.0)
MCH: 22.3 pg — ABNORMAL LOW (ref 26.0–34.0)
MCHC: 28.5 g/dL — ABNORMAL LOW (ref 30.0–36.0)
MCV: 78.5 fL — ABNORMAL LOW (ref 80.0–100.0)
Platelets: 192 10*3/uL (ref 150–400)
RBC: 3.49 MIL/uL — ABNORMAL LOW (ref 3.87–5.11)
RDW: 25.4 % — ABNORMAL HIGH (ref 11.5–15.5)
WBC: 20.3 10*3/uL — ABNORMAL HIGH (ref 4.0–10.5)
nRBC: 0 % (ref 0.0–0.2)

## 2019-06-04 LAB — GLUCOSE, CAPILLARY
Glucose-Capillary: 148 mg/dL — ABNORMAL HIGH (ref 70–99)
Glucose-Capillary: 178 mg/dL — ABNORMAL HIGH (ref 70–99)
Glucose-Capillary: 150 mg/dL — ABNORMAL HIGH (ref 70–99)
Glucose-Capillary: 148 mg/dL — ABNORMAL HIGH (ref 70–99)

## 2019-06-04 MED ORDER — DRONABINOL 2.5 MG PO CAPS
2.5000 mg | ORAL_CAPSULE | Freq: Two times a day (BID) | ORAL | Status: DC
  Administered 2019-06-04 – 2019-06-06 (×3): 2.5 mg via ORAL
  Filled 2019-06-04 (×3): qty 1

## 2019-06-04 NOTE — NC FL2 (Deleted)
[place] LEVEL OF CARE SCREENING TOOL     IDENTIFICATION  Patient Name: [name] Birthdate: Jun 06, 1955 Sex: female Admission Date (Current Location): 05/26/2019  [place] and [place] Number:  Engineering geologist and Address:  [facility], [address], [place], [place] [address]      Provider Number: [id number]  Attending Physician Name and Address:  Loletha Grayer, MD  Relative Name and Phone Number:  Lashunta Frieden    Current Level of Care: Hospital Recommended Level of Care: [place] Prior Approval Number:    Date Approved/Denied:   PASRR Number: [id number]  Discharge Plan: SNF    Current Diagnoses: Patient Active Problem List   Diagnosis Date Noted  . Type 2 diabetes mellitus with stage 3b chronic kidney disease, with long-term current use of insulin ([place])   . DNR (do not resuscitate) discussion   . Acute metabolic encephalopathy   . Hypotension   . Acute kidney injury superimposed on CKD ([place])   . Goals of care, counseling/discussion   . Palliative care by specialist   . Anasarca 05/26/2019  . Cellulitis 05/26/2019  . Diabetic foot ulcer ([place]) 05/26/2019  . Decreased level of consciousness 05/26/2019  . Moderate mitral regurgitation by prior echocardiogram 05/04/2019  . Moderate - severe tricuspid regurgitation by prior echocardiogram 05/04/2019  . CKD stage 3 due to type 2 diabetes mellitus ([place]) 05/04/2019  . HCV antibody positive 05/04/2019  . Other cirrhosis of liver ([place]) 05/04/2019  . Fall 02/24/2019  . UTI (urinary tract infection) 02/23/2019  . AVM (arteriovenous malformation) of small bowel, acquired with hemorrhage   . Epistaxis 11/29/2018  . Altered mental status   . Heme positive stool   . Gastritis and gastroduodenitis   . Encephalopathy acute   . Acute on chronic blood loss anemia 11/20/2018  . Pressure injury of skin 10/26/2018  . Anxiety 08/15/2018  . Chronic back  pain 08/15/2018  . GERD (gastroesophageal reflux disease) 08/15/2018  . History of carpal tunnel syndrome 08/15/2018  . HNP (herniated nucleus pulposus), lumbar 08/15/2018  . Hyperlipidemia 08/15/2018  . Hypertension 08/15/2018  . Insomnia 08/15/2018  . Left ventricular hypertrophy 08/15/2018  . Obesity 08/15/2018  . Sleep apnea 08/15/2018  . Anemia of chronic disease 06/20/2018  . Diabetic polyneuropathy associated with type 2 diabetes mellitus ([place]) 02/23/2018  . Moderate tricuspid regurgitation 02/18/2018  . Acute on chronic diastolic CHF (congestive heart failure) ([place]) 02/08/2018  . 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 Depression 02/05/2018  . Generalized abdominal pain 02/05/2018  . CHF (congestive heart failure) ([place]) 01/13/2018  . B12 deficiency 12/07/2017  . Iron deficiency anemia due to chronic blood loss 12/07/2017  . Folate deficiency 12/07/2017  . Upper GI bleed 11/03/2017  . COPD (chronic obstructive pulmonary disease) ([place]) 09/26/2017  . Symptomatic anemia 09/26/2017  . Anemia   . Weakness   . Lower GI bleeding 08/07/2017  . Chronic systolic heart failure ([place]) 12/28/2016  . Tobacco use 12/28/2016  . NSTEMI (non-ST elevated myocardial infarction) ([place])   . Sepsis ([place]) 12/18/2016  . CHF exacerbation ([place]) 12/04/2015  . PNA (pneumonia) 10/28/2015  . Chronic hepatitis C without hepatic coma ([place]) 05/21/2014  . CAD (coronary artery disease) 07/19/2013  . Right arm pain 06/26/2013  . Cervical pain (neck) 03/28/2013  . Radiculopathy affecting upper extremity 03/28/2013  . Impaired renal function 12/21/2012  . Abnormal mammogram 12/20/2012  . Diabetes mellitus type 2, uncontrolled ([place]) 12/20/2012    Orientation RESPIRATION BLADDER  Height & Weight     Self, Time, Situation, Place  Normal Continent Weight: 116.7 kg Height:  5' 5"  (165.1 cm)  BEHAVIORAL SYMPTOMS/MOOD NEUROLOGICAL BOWEL NUTRITION STATUS      Continent Diet  AMBULATORY STATUS  COMMUNICATION OF NEEDS Skin   Extensive Assist Verbally Normal                       Personal Care Assistance Level of Assistance  Bathing, Feeding, Dressing Bathing Assistance: Maximum assistance Feeding assistance: Maximum assistance Dressing Assistance: Maximum assistance     Functional Limitations Info  Sight Sight Info: Impaired(glasses)        SPECIAL CARE FACTORS FREQUENCY  PT (By licensed PT), OT (By licensed OT)     PT Frequency: 5x a week OT Frequency: 5x a week            Contractures Contractures Info: Present    Additional Factors Info  Code Status, Allergies Code Status Info: Full Allergies Info: Latex, Gabapentin           Current Medications (06/04/2019):  This is the current hospital active medication list Current Facility-Administered Medications  Medication Dose Route Frequency Provider Last Rate Last Admin  . 0.9 %  sodium chloride infusion   Intravenous PRN Loletha Grayer, MD 10 mL/hr at 06/04/19 0616 250 mL at 06/04/19 0616  . ammonium lactate (LAC-HYDRIN) 12 % lotion 1 application  1 application Topical BID Swayze, Ava, DO   1 application at 67/20/94 0801  . Ampicillin-Sulbactam (UNASYN) 3 g in sodium chloride 0.9 % 100 mL IVPB  3 g Intravenous Q6H Oswald Hillock, RPH 200 mL/hr at 06/04/19 0617 3 g at 06/04/19 0617  . ascorbic acid (VITAMIN C) tablet 250 mg  250 mg Oral BID Swayze, Ava, DO   250 mg at 06/04/19 0800  . atorvastatin (LIPITOR) tablet 40 mg  40 mg Oral Daily Swayze, Ava, DO   40 mg at 06/04/19 0800  . Chlorhexidine Gluconate Cloth 2 % PADS 6 each  6 each Topical Daily Loletha Grayer, MD   6 each at 06/04/19 0802  . feeding supplement (ENSURE ENLIVE) (ENSURE ENLIVE) liquid 237 mL  237 mL Oral BID Swayze, Ava, DO   237 mL at 06/04/19 0800  . feeding supplement (PRO-STAT SUGAR FREE 64) liquid 30 mL  30 mL Oral BID Swayze, Ava, DO   30 mL at 06/03/19 2155  . ferrous sulfate tablet 325 mg  325 mg Oral BID WC Swayze, Ava, DO    325 mg at 06/04/19 0800  . fluticasone (FLONASE) 50 MCG/ACT nasal spray 2 spray  2 spray Each Nare Daily Swayze, Ava, DO   2 spray at 06/04/19 0801  . folic acid (FOLVITE) tablet 1 mg  1 mg Oral Daily Swayze, Ava, DO   1 mg at 06/04/19 0800  . furosemide (LASIX) tablet 20 mg  20 mg Oral Daily Loletha Grayer, MD   20 mg at 06/04/19 0801  . insulin aspart (novoLOG) injection 0-9 Units  0-9 Units Subcutaneous TID WC Swayze, Ava, DO   1 Units at 06/04/19 0759  . iohexol (OMNIPAQUE) 9 MG/ML oral solution 500 mL  500 mL Oral Q1H PRN Loletha Grayer, MD   500 mL at 05/31/19 1228  . lactulose (CHRONULAC) 10 GM/15ML solution 20 g  20 g Oral TID Swayze, Ava, DO   20 g at 06/04/19 0801  . mirtazapine (REMERON) tablet 30 mg  30 mg Oral QHS Swayze, Ava, DO  30 mg at 06/03/19 2155  . multivitamin with minerals tablet 1 tablet  1 tablet Oral Daily Swayze, Ava, DO   1 tablet at 06/04/19 0800  . oxyCODONE (Oxy IR/ROXICODONE) immediate release tablet 10 mg  10 mg Oral Q4H PRN Lang Snow, NP   10 mg at 06/04/19 0615  . sodium chloride flush (NS) 0.9 % injection 10-40 mL  10-40 mL Intracatheter Q12H Loletha Grayer, MD   10 mL at 06/04/19 0801  . sodium chloride flush (NS) 0.9 % injection 10-40 mL  10-40 mL Intracatheter PRN Wieting, Richard, MD      . traMADol Veatrice Bourbon) tablet 50 mg  50 mg Oral Q6H PRN Swayze, Ava, DO   50 mg at 06/04/19 5110     Discharge Medications: Please see discharge summary for a list of discharge medications.  Relevant Imaging Results:  Relevant Lab Results:   Additional Information ss#[phone] [id number] [phone], IV antibiotics unasyn 3 gms q 6 hours.  Victorino Dike, RN

## 2019-06-04 NOTE — Progress Notes (Signed)
Spoke with Primary RN about PICC placement . Patient pulling at lines and tele leads . RN to reach out to ID MD to see if PO antibiotics  could be considered. If PICC is still needed RN aware it will be placed tomorrow.

## 2019-06-04 NOTE — Progress Notes (Signed)
Nutrition Follow Up Note   DOCUMENTATION CODES:   Obesity unspecified  INTERVENTION:   If patient wishes to continue with full scope of care, recommend nasogastric feeding tube and nutrition support.   Ensure Enlive po BID, each supplement provides 350 kcal and 20 grams of protein  Prostat liquid protein PO 30 ml BID with meals, each supplement provides 100 kcal, 15 grams protein.  MVI daily   Vitamin C 263m po BID  Liberalize diet   Consider additional appetite stimulant   NUTRITION DIAGNOSIS:   Increased nutrient needs related to wound healing as evidenced by increased estimated needs.  GOAL:   Patient will meet greater than or equal to 90% of their needs  MONITOR:   PO intake, Supplement acceptance, Labs, Weight trends, Skin, I & O's  REASON FOR ASSESSMENT:   Consult Assessment of nutrition requirement/status  ASSESSMENT:   64y/o. female with h/o diastolic congestive heart failure with anasarca, acute on chronic anemia, CKD IIIB, hepatitis C with cirrhosis and chronic venous ulcers in the leg who is admitted with right foot pain and difficulty walking.   Spoke with RN today, pt continues to have poor appetite and oral intake; pt eating only sips and bites of meals and supplements. Pt seems depressed and down and does not really wish to engage in much conversation or activity including eating. Pt has had poor appetite and oral intake since admit and is now without adequate nutrition for > 7 days. Pt is being followed by palliative care. Would recommend for palliative care to readress GOC with patient to see if she would want a nasogastric tube and nutrition support. Pt is at high refeed risk. Pt has been on remeron since 5/9 with no improvement in appetite; may want to consider additional appetite stimulant. If pt wishes to continue with full scope of care, would recommend NGT placement and nutrition support. RD will liberalize the heart healthy portion of pt's diet to  try and encourage increased oral intake. Per chart, pt is fairly weight stable since admit.  Medications reviewed and include: vitamin C, ferrous sulfate, folic acid, lasix, insulin, lactulose, remeron, MVI, unasyn  Labs reviewed: BUN 33(H), creat 1.81(H) Wbc- 20.3(H), Hgb 7.8(L), Hct 27.4(L), MCV 78.5(L), MCH 22.3(L), MCHC 28.5(L) cbgs- 172, 195 x 24 hrs AIC 7.4(H)- 5/10  Diet Order:   Diet Order            Diet Carb Modified Fluid consistency: Thin; Room service appropriate? Yes  Diet effective now             EDUCATION NEEDS:   Education needs have been addressed  Skin:  Skin Assessment: Reviewed RN Assessment(Left anterior calf 5X6X.1cm, Right anterior leg 13X7cm, Right plantar foot 1X1X.1cm, Right plantar great toe .2X.2cm)  Last BM:  5/18- TYPE 7  Height:   Ht Readings from Last 1 Encounters:  05/26/19 5' 5"  (1.651 m)    Weight:   Wt Readings from Last 1 Encounters:  06/04/19 116.7 kg    Ideal Body Weight:  56.8 kg  BMI:  Body mass index is 42.8 kg/m.  Estimated Nutritional Needs:   Kcal:  1900-2200kcal/day  Protein:  95-105g/day  Fluid:  >1.7L/day  CKoleen DistanceMS, RD, LDN Please refer to [facility] and/or RD on-call/weekend/after hours pager

## 2019-06-04 NOTE — Progress Notes (Signed)
This RN attempted to change wounds on bilateral leg. Patient very tender to touch and scream "Do not touch my legs! Leave me alone I do not want to be bother". I educated patient on the importance of the dressing change however patient still refused.

## 2019-06-04 NOTE — Progress Notes (Signed)
Called by NT, patient had pulled PICC line out, no bleeding, catheter intact, pt states" it was aggravating and I pulled it out" educated patient on the importance to keep PICC line in. MD notified, new order placed for IV team for reinsertion of PICC line. Will continue to monitor.

## 2019-06-04 NOTE — NC FL2 (Signed)
[place] LEVEL OF CARE SCREENING TOOL     IDENTIFICATION  Patient Name: [name] Birthdate: 13-Feb-1955 Sex: female Admission Date (Current Location): 05/26/2019  [place] and [place] Number:  Engineering geologist and Address:  [facility], [address] [place], [place] [address]      Provider Number: [id number]  Attending Physician Name and Address:  Loletha Grayer, MD  Relative Name and Phone Number:  Vitoria Conyer    Current Level of Care: Hospital Recommended Level of Care: [place] Prior Approval Number:    Date Approved/Denied:   PASRR Number: [id number]  Discharge Plan: SNF    Current Diagnoses: Patient Active Problem List   Diagnosis Date Noted  . Type 2 diabetes mellitus with stage 3b chronic kidney disease, with long-term current use of insulin ([place])   . DNR (do not resuscitate) discussion   . Acute metabolic encephalopathy   . Hypotension   . Acute kidney injury superimposed on CKD ([place])   . Goals of care, counseling/discussion   . Palliative care by specialist   . Anasarca 05/26/2019  . Cellulitis 05/26/2019  . Diabetic foot ulcer ([place]) 05/26/2019  . Decreased level of consciousness 05/26/2019  . Moderate mitral regurgitation by prior echocardiogram 05/04/2019  . Moderate - severe tricuspid regurgitation by prior echocardiogram 05/04/2019  . CKD stage 3 due to type 2 diabetes mellitus ([place]) 05/04/2019  . HCV antibody positive 05/04/2019  . Other cirrhosis of liver ([place]) 05/04/2019  . Fall 02/24/2019  . UTI (urinary tract infection) 02/23/2019  . AVM (arteriovenous malformation) of small bowel, acquired with hemorrhage   . Epistaxis 11/29/2018  . Altered mental status   . Heme positive stool   . Gastritis and gastroduodenitis   . Encephalopathy acute   . Acute on chronic blood loss anemia 11/20/2018  . Pressure injury of skin 10/26/2018  . Anxiety 08/15/2018  . Chronic back  pain 08/15/2018  . GERD (gastroesophageal reflux disease) 08/15/2018  . History of carpal tunnel syndrome 08/15/2018  . HNP (herniated nucleus pulposus), lumbar 08/15/2018  . Hyperlipidemia 08/15/2018  . Hypertension 08/15/2018  . Insomnia 08/15/2018  . Left ventricular hypertrophy 08/15/2018  . Obesity 08/15/2018  . Sleep apnea 08/15/2018  . Anemia of chronic disease 06/20/2018  . Diabetic polyneuropathy associated with type 2 diabetes mellitus ([place]) 02/23/2018  . Moderate tricuspid regurgitation 02/18/2018  . Acute on chronic diastolic CHF (congestive heart failure) ([place]) 02/08/2018  . 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 Depression 02/05/2018  . Generalized abdominal pain 02/05/2018  . CHF (congestive heart failure) ([place]) 01/13/2018  . B12 deficiency 12/07/2017  . Iron deficiency anemia due to chronic blood loss 12/07/2017  . Folate deficiency 12/07/2017  . Upper GI bleed 11/03/2017  . COPD (chronic obstructive pulmonary disease) ([place]) 09/26/2017  . Symptomatic anemia 09/26/2017  . Anemia   . Weakness   . Lower GI bleeding 08/07/2017  . Chronic systolic heart failure ([place]) 12/28/2016  . Tobacco use 12/28/2016  . NSTEMI (non-ST elevated myocardial infarction) ([place])   . Sepsis ([place]) 12/18/2016  . CHF exacerbation ([place]) 12/04/2015  . PNA (pneumonia) 10/28/2015  . Chronic hepatitis C without hepatic coma ([place]) 05/21/2014  . CAD (coronary artery disease) 07/19/2013  . Right arm pain 06/26/2013  . Cervical pain (neck) 03/28/2013  . Radiculopathy affecting upper extremity 03/28/2013  . Impaired renal function 12/21/2012  . Abnormal mammogram 12/20/2012  . Diabetes mellitus type 2, uncontrolled ([place]) 12/20/2012    Orientation RESPIRATION BLADDER  Height & Weight     Self, Time, Situation, Place  Normal Continent Weight: 116.7 kg Height:  5' 5"  (165.1 cm)  BEHAVIORAL SYMPTOMS/MOOD NEUROLOGICAL BOWEL NUTRITION STATUS      Continent Diet  AMBULATORY STATUS  COMMUNICATION OF NEEDS Skin   Extensive Assist Verbally Normal                       Personal Care Assistance Level of Assistance  Bathing, Feeding, Dressing Bathing Assistance: Maximum assistance Feeding assistance: Maximum assistance Dressing Assistance: Maximum assistance     Functional Limitations Info  Sight Sight Info: Impaired(glasses)        SPECIAL CARE FACTORS FREQUENCY  PT (By licensed PT), OT (By licensed OT)     PT Frequency: 5x a week OT Frequency: 5x a week            Contractures Contractures Info: Present    Additional Factors Info  Code Status, Allergies Code Status Info: Full Allergies Info: Latex, Gabapentin           Current Medications (06/04/2019):  This is the current hospital active medication list Current Facility-Administered Medications  Medication Dose Route Frequency Provider Last Rate Last Admin  . 0.9 %  sodium chloride infusion   Intravenous PRN Loletha Grayer, MD 10 mL/hr at 06/04/19 0616 250 mL at 06/04/19 0616  . ammonium lactate (LAC-HYDRIN) 12 % lotion 1 application  1 application Topical BID Swayze, Ava, DO   1 application at 63/84/66 0801  . Ampicillin-Sulbactam (UNASYN) 3 g in sodium chloride 0.9 % 100 mL IVPB  3 g Intravenous Q6H Oswald Hillock, RPH 200 mL/hr at 06/04/19 0617 3 g at 06/04/19 0617  . ascorbic acid (VITAMIN C) tablet 250 mg  250 mg Oral BID Swayze, Ava, DO   250 mg at 06/04/19 0800  . atorvastatin (LIPITOR) tablet 40 mg  40 mg Oral Daily Swayze, Ava, DO   40 mg at 06/04/19 0800  . Chlorhexidine Gluconate Cloth 2 % PADS 6 each  6 each Topical Daily Loletha Grayer, MD   6 each at 06/04/19 0802  . feeding supplement (ENSURE ENLIVE) (ENSURE ENLIVE) liquid 237 mL  237 mL Oral BID Swayze, Ava, DO   237 mL at 06/04/19 0800  . feeding supplement (PRO-STAT SUGAR FREE 64) liquid 30 mL  30 mL Oral BID Swayze, Ava, DO   30 mL at 06/03/19 2155  . ferrous sulfate tablet 325 mg  325 mg Oral BID WC Swayze, Ava, DO    325 mg at 06/04/19 0800  . fluticasone (FLONASE) 50 MCG/ACT nasal spray 2 spray  2 spray Each Nare Daily Swayze, Ava, DO   2 spray at 06/04/19 0801  . folic acid (FOLVITE) tablet 1 mg  1 mg Oral Daily Swayze, Ava, DO   1 mg at 06/04/19 0800  . furosemide (LASIX) tablet 20 mg  20 mg Oral Daily Loletha Grayer, MD   20 mg at 06/04/19 0801  . insulin aspart (novoLOG) injection 0-9 Units  0-9 Units Subcutaneous TID WC Swayze, Ava, DO   1 Units at 06/04/19 0759  . iohexol (OMNIPAQUE) 9 MG/ML oral solution 500 mL  500 mL Oral Q1H PRN Loletha Grayer, MD   500 mL at 05/31/19 1228  . lactulose (CHRONULAC) 10 GM/15ML solution 20 g  20 g Oral TID Swayze, Ava, DO   20 g at 06/04/19 0801  . mirtazapine (REMERON) tablet 30 mg  30 mg Oral QHS Swayze, Ava, DO  30 mg at 06/03/19 2155  . multivitamin with minerals tablet 1 tablet  1 tablet Oral Daily Swayze, Ava, DO   1 tablet at 06/04/19 0800  . oxyCODONE (Oxy IR/ROXICODONE) immediate release tablet 10 mg  10 mg Oral Q4H PRN Lang Snow, NP   10 mg at 06/04/19 0615  . sodium chloride flush (NS) 0.9 % injection 10-40 mL  10-40 mL Intracatheter Q12H Loletha Grayer, MD   10 mL at 06/04/19 0801  . sodium chloride flush (NS) 0.9 % injection 10-40 mL  10-40 mL Intracatheter PRN Wieting, Richard, MD      . traMADol Veatrice Bourbon) tablet 50 mg  50 mg Oral Q6H PRN Swayze, Ava, DO   50 mg at 06/04/19 2446     Discharge Medications: Please see discharge summary for a list of discharge medications.  Relevant Imaging Results:  Relevant Lab Results:   Additional Information ss#[id number] [phone], IV antibiotics thru PICC 2gms Ceftriaxone daily until 06/23/2019  Victorino Dike, RN

## 2019-06-04 NOTE — Progress Notes (Signed)
Unable to apply SCD's patient is complaining of leg pain when touching her legs.

## 2019-06-04 NOTE — Progress Notes (Signed)
Patient is removing telemetry monitor, patient states a man is coming in removing them and she is not crazy. Placed telemonitor on her back.

## 2019-06-04 NOTE — NC FL2 (Deleted)
[place] LEVEL OF CARE SCREENING TOOL     IDENTIFICATION  Patient Name: [name] Birthdate: [birth year] Sex: female Admission Date (Current Location): 05/26/2019  [place] and [place] Number:  Engineering geologist and Address:  [facility], [address] [place], [place] [address]      Provider Number: [id number]  Attending Physician Name and Address:  Loletha Grayer, MD  Relative Name and Phone Number:  Maeley Matton    Current Level of Care: Hospital Recommended Level of Care: [place] Prior Approval Number:    Date Approved/Denied:   PASRR Number: [id number]  Discharge Plan: SNF    Current Diagnoses: Patient Active Problem List   Diagnosis Date Noted  . Type 2 diabetes mellitus with stage 3b chronic kidney disease, with long-term current use of insulin ([place])   . DNR (do not resuscitate) discussion   . Acute metabolic encephalopathy   . Hypotension   . Acute kidney injury superimposed on CKD ([place])   . Goals of care, counseling/discussion   . Palliative care by specialist   . Anasarca 05/26/2019  . Cellulitis 05/26/2019  . Diabetic foot ulcer ([place]) 05/26/2019  . Decreased level of consciousness 05/26/2019  . Moderate mitral regurgitation by prior echocardiogram 05/04/2019  . Moderate - severe tricuspid regurgitation by prior echocardiogram 05/04/2019  . CKD stage 3 due to type 2 diabetes mellitus ([place]) 05/04/2019  . HCV antibody positive 05/04/2019  . Other cirrhosis of liver ([place]) 05/04/2019  . Fall 02/24/2019  . UTI (urinary tract infection) 02/23/2019  . AVM (arteriovenous malformation) of small bowel, acquired with hemorrhage   . Epistaxis 11/29/2018  . Altered mental status   . Heme positive stool   . Gastritis and gastroduodenitis   . Encephalopathy acute   . Acute on chronic blood loss anemia 11/20/2018  . Pressure injury of skin 10/26/2018  . Anxiety 08/15/2018  . Chronic back  pain 08/15/2018  . GERD (gastroesophageal reflux disease) 08/15/2018  . History of carpal tunnel syndrome 08/15/2018  . HNP (herniated nucleus pulposus), lumbar 08/15/2018  . Hyperlipidemia 08/15/2018  . Hypertension 08/15/2018  . Insomnia 08/15/2018  . Left ventricular hypertrophy 08/15/2018  . Obesity 08/15/2018  . Sleep apnea 08/15/2018  . Anemia of chronic disease 06/20/2018  . Diabetic polyneuropathy associated with type 2 diabetes mellitus ([place]) 02/23/2018  . Moderate tricuspid regurgitation 02/18/2018  . Acute on chronic diastolic CHF (congestive heart failure) ([place]) 02/08/2018  . 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 Depression 02/05/2018  . Generalized abdominal pain 02/05/2018  . CHF (congestive heart failure) ([place]) 01/13/2018  . B12 deficiency 12/07/2017  . Iron deficiency anemia due to chronic blood loss 12/07/2017  . Folate deficiency 12/07/2017  . Upper GI bleed 11/03/2017  . COPD (chronic obstructive pulmonary disease) ([place]) 09/26/2017  . Symptomatic anemia 09/26/2017  . Anemia   . Weakness   . Lower GI bleeding 08/07/2017  . Chronic systolic heart failure ([place]) 12/28/2016  . Tobacco use 12/28/2016  . NSTEMI (non-ST elevated myocardial infarction) ([place])   . Sepsis ([place]) 12/18/2016  . CHF exacerbation ([place]) 12/04/2015  . PNA (pneumonia) 10/28/2015  . Chronic hepatitis C without hepatic coma ([place]) 05/21/2014  . CAD (coronary artery disease) 07/19/2013  . Right arm pain 06/26/2013  . Cervical pain (neck) 03/28/2013  . Radiculopathy affecting upper extremity 03/28/2013  . Impaired renal function 12/21/2012  . Abnormal mammogram 12/20/2012  . Diabetes mellitus type 2, uncontrolled ([place]) 12/20/2012    Orientation RESPIRATION BLADDER  Height & Weight     Self, Time, Situation, Place  Normal Continent Weight: 116.7 kg Height:  5' 5"  (165.1 cm)  BEHAVIORAL SYMPTOMS/MOOD NEUROLOGICAL BOWEL NUTRITION STATUS      Continent Diet  AMBULATORY STATUS  COMMUNICATION OF NEEDS Skin   Extensive Assist Verbally Normal                       Personal Care Assistance Level of Assistance  Bathing, Feeding, Dressing Bathing Assistance: Maximum assistance Feeding assistance: Maximum assistance Dressing Assistance: Maximum assistance     Functional Limitations Info  Sight Sight Info: Impaired(glasses)        SPECIAL CARE FACTORS FREQUENCY  PT (By licensed PT), OT (By licensed OT)     PT Frequency: 5x a week OT Frequency: 5x a week            Contractures Contractures Info: Present    Additional Factors Info  Code Status, Allergies Code Status Info: Full Allergies Info: Latex, Gabapentin           Current Medications (06/04/2019):  This is the current hospital active medication list Current Facility-Administered Medications  Medication Dose Route Frequency Provider Last Rate Last Admin  . 0.9 %  sodium chloride infusion   Intravenous PRN Loletha Grayer, MD 10 mL/hr at 06/04/19 0616 250 mL at 06/04/19 0616  . ammonium lactate (LAC-HYDRIN) 12 % lotion 1 application  1 application Topical BID Swayze, Ava, DO   1 application at 46/50/35 0801  . Ampicillin-Sulbactam (UNASYN) 3 g in sodium chloride 0.9 % 100 mL IVPB  3 g Intravenous Q6H Oswald Hillock, RPH 200 mL/hr at 06/04/19 0617 3 g at 06/04/19 0617  . ascorbic acid (VITAMIN C) tablet 250 mg  250 mg Oral BID Swayze, Ava, DO   250 mg at 06/04/19 0800  . atorvastatin (LIPITOR) tablet 40 mg  40 mg Oral Daily Swayze, Ava, DO   40 mg at 06/04/19 0800  . Chlorhexidine Gluconate Cloth 2 % PADS 6 each  6 each Topical Daily Loletha Grayer, MD   6 each at 06/04/19 0802  . feeding supplement (ENSURE ENLIVE) (ENSURE ENLIVE) liquid 237 mL  237 mL Oral BID Swayze, Ava, DO   237 mL at 06/04/19 0800  . feeding supplement (PRO-STAT SUGAR FREE 64) liquid 30 mL  30 mL Oral BID Swayze, Ava, DO   30 mL at 06/03/19 2155  . ferrous sulfate tablet 325 mg  325 mg Oral BID WC Swayze, Ava, DO    325 mg at 06/04/19 0800  . fluticasone (FLONASE) 50 MCG/ACT nasal spray 2 spray  2 spray Each Nare Daily Swayze, Ava, DO   2 spray at 06/04/19 0801  . folic acid (FOLVITE) tablet 1 mg  1 mg Oral Daily Swayze, Ava, DO   1 mg at 06/04/19 0800  . furosemide (LASIX) tablet 20 mg  20 mg Oral Daily Loletha Grayer, MD   20 mg at 06/04/19 0801  . insulin aspart (novoLOG) injection 0-9 Units  0-9 Units Subcutaneous TID WC Swayze, Ava, DO   1 Units at 06/04/19 0759  . iohexol (OMNIPAQUE) 9 MG/ML oral solution 500 mL  500 mL Oral Q1H PRN Loletha Grayer, MD   500 mL at 05/31/19 1228  . lactulose (CHRONULAC) 10 GM/15ML solution 20 g  20 g Oral TID Swayze, Ava, DO   20 g at 06/04/19 0801  . mirtazapine (REMERON) tablet 30 mg  30 mg Oral QHS Swayze, Ava, DO  30 mg at 06/03/19 2155  . multivitamin with minerals tablet 1 tablet  1 tablet Oral Daily Swayze, Ava, DO   1 tablet at 06/04/19 0800  . oxyCODONE (Oxy IR/ROXICODONE) immediate release tablet 10 mg  10 mg Oral Q4H PRN Lang Snow, NP   10 mg at 06/04/19 0615  . sodium chloride flush (NS) 0.9 % injection 10-40 mL  10-40 mL Intracatheter Q12H Loletha Grayer, MD   10 mL at 06/04/19 0801  . sodium chloride flush (NS) 0.9 % injection 10-40 mL  10-40 mL Intracatheter PRN Wieting, Richard, MD      . traMADol Veatrice Bourbon) tablet 50 mg  50 mg Oral Q6H PRN Swayze, Ava, DO   50 mg at 06/04/19 8088     Discharge Medications: Please see discharge summary for a list of discharge medications.  Relevant Imaging Results:  Relevant Lab Results:   Additional Information ss#[id number], IV antibiotics (either: ceftriaxone once daily or unasyn 3 gms q 6 hours.  Victorino Dike, RN

## 2019-06-04 NOTE — Progress Notes (Signed)
Per Dr. Delaine Lame patient will need IV abx therefore PICC line will need to be inserted. Per IV team PICC would be placed tomorrow.

## 2019-06-04 NOTE — Progress Notes (Signed)
Patients has multiple open sores on her buttocks and thighs. Foam dressing has been applied to open area with each incontinent stool.

## 2019-06-04 NOTE — Progress Notes (Signed)
Patient ID: [name], female   DOB: 1955/02/10, 64 y.o.   MRN: [id number] [facility] PROGRESS NOTE  [name] EHO:[id number] DOB: [birth year] DOA: 05/26/2019 PCP: [place]  HPI/Subjective: Patient answering yes or no questions.  Does not elaborate much.  She states that she is eating just a little bit.  I have been encouraging her to eat every day.  Does complain of pain in the foot when somebody touches it or moves it.  Objective: Vitals:   06/04/19 0740 06/04/19 1118  BP: (!) 107/40 (!) 103/57  Pulse: 85 83  Resp: 18 18  Temp: 98.1 F (36.7 C) 97.9 F (36.6 C)  SpO2: 92% 90%    Intake/Output Summary (Last 24 hours) at 06/04/2019 1559 Last data filed at 06/04/2019 1100 Gross per 24 hour  Intake 1069.05 ml  Output 250 ml  Net 819.05 ml   Filed Weights   06/02/19 0442 06/03/19 0543 06/04/19 0508  Weight: 118.1 kg 119.1 kg 116.7 kg    ROS: Review of Systems  Constitutional: Positive for malaise/fatigue. Negative for fever.  Eyes: Negative for blurred vision.  Respiratory: Negative for cough and shortness of breath.   Cardiovascular: Negative for chest pain.  Gastrointestinal: Negative for abdominal pain, nausea and vomiting.  Genitourinary: Negative for dysuria.  Musculoskeletal: Positive for joint pain.  Neurological: Negative for dizziness.   Exam: Physical Exam  HENT:  Nose: No mucosal edema.  Mouth/Throat: No oropharyngeal exudate or posterior oropharyngeal edema.  Eyes: Conjunctivae and lids are normal.  Neck: Carotid bruit is not present.  Cardiovascular: S1 normal and S2 normal. Exam reveals no gallop.  No murmur heard. Respiratory: No respiratory distress. She has decreased breath sounds in the right lower field and the left lower field. She has no wheezes. She has no rhonchi. She has no rales.  GI: Soft. Bowel sounds are normal. There is no abdominal tenderness.  Musculoskeletal:     Right ankle: Swelling present.     Left  ankle: Swelling present.  Lymphadenopathy:    She has no cervical adenopathy.  Neurological: She is alert. No cranial nerve deficit.  Skin: Skin is warm. Nails show no clubbing.  Unna boots on bilateral lower extremity.  Redness and swelling of feet and toes worse on the right.  Psychiatric: She has a normal mood and affect.      Data Reviewed: Basic Metabolic Panel: Recent Labs  Lab 05/31/19 0513 06/01/19 0557 06/02/19 0746 06/03/19 0411 06/04/19 0818  NA 136 136 139 140 140  K 4.7 4.5 4.7 4.6 4.8  CL 103 104 106 105 106  CO2 24 24 23 24 26   GLUCOSE 218* 159* 168* 159* 182*  BUN 51* 44* 40* 35* 33*  CREATININE 2.14* 1.95* 1.83* 1.84* 1.82*  CALCIUM 8.1* 8.0* 8.1* 8.3* 8.1*    Recent Labs  Lab 05/30/19 0437  AMMONIA 17   CBC: Recent Labs  Lab 05/31/19 0513 06/01/19 0557 06/02/19 0746 06/03/19 0411 06/04/19 0818  WBC 21.7* 24.2* 25.2* 20.3* 20.3*  NEUTROABS  --   --   --  18.4*  --   HGB 8.9* 8.3* 7.8* 8.1* 7.8*  HCT 29.5* 28.1* 26.2* 27.2* 27.4*  MCV 73.9* 75.5* 75.1* 74.9* 78.5*  PLT 200 198 186 185 192   Cardiac Enzymes: Recent Labs  Lab 06/02/19 0746  CKTOTAL 49   BNP (last 3 results) Recent Labs    08/17/18 1030 05/04/19 1133 05/26/19 1457  BNP 861.0* 778.0* 827.0*  CBG: Recent Labs  Lab 06/03/19 1132 06/03/19 1636 06/03/19 2125 06/04/19 0741 06/04/19 1142  GLUCAP 146* 154* 148* 148* 178*    Recent Results (from the past 240 hour(s))  Culture, blood (routine x 2)     Status: Abnormal   Collection Time: 05/26/19  2:57 PM   Specimen: BLOOD  Result Value Ref Range Status   Specimen Description   Final    BLOOD BLOOD LEFT FOREARM Performed at [facility], [address], [place], [place] [address]    Special Requests   Final    BOTTLES DRAWN AEROBIC AND ANAEROBIC Blood Culture results may not be optimal due to an inadequate volume of blood received in culture bottles Performed at [facility], [address] [place], [place] [address]    Culture  Setup Time   Final    GRAM POSITIVE COCCI IN BOTH AEROBIC AND ANAEROBIC BOTTLES CRITICAL RESULT CALLED TO, READ BACK BY AND VERIFIED WITH: DAVID BESANTI 05/27/19 AT 0237 HS    Culture (A)  Final    STREPTOCOCCUS GROUP C SUSCEPTIBILITIES PERFORMED ON PREVIOUS CULTURE WITHIN THE LAST 5 DAYS. Performed at [facility], [place] [address] [place], [place] [address]    Report Status 05/29/2019 FINAL  Final  Culture, blood (routine x 2)     Status: Abnormal   Collection Time: 05/26/19  2:57 PM   Specimen: BLOOD  Result Value Ref Range Status   Specimen Description   Final    BLOOD LEFT ANTECUBITAL Performed at [facility], [address], [place], [place] [address]    Special Requests   Final    BOTTLES DRAWN AEROBIC AND ANAEROBIC Blood Culture adequate volume Performed at [facility], [address], [place], [place] [address]    Culture  Setup Time   Final    GRAM POSITIVE COCCI IN BOTH AEROBIC AND ANAEROBIC BOTTLES CRITICAL VALUE NOTED.  VALUE IS CONSISTENT WITH PREVIOUSLY REPORTED AND CALLED VALUE. Performed at [facility], [place], [place] [address]    Culture STREPTOCOCCUS GROUP C (A)  Final   Report Status 05/29/2019 FINAL  Final   Organism ID, Bacteria STREPTOCOCCUS GROUP C  Final      Susceptibility   Streptococcus group c - MIC*    CLINDAMYCIN <=0.25 SENSITIVE Sensitive     AMPICILLIN <=0.25 SENSITIVE Sensitive     ERYTHROMYCIN <=0.12 SENSITIVE Sensitive     VANCOMYCIN 0.25 SENSITIVE Sensitive     CEFTRIAXONE <=0.12 SENSITIVE Sensitive     LEVOFLOXACIN 0.5 SENSITIVE Sensitive     PENICILLIN Value in next row Sensitive      SENSITIVE<=0.06    * STREPTOCOCCUS GROUP C  Respiratory Panel by RT PCR (Flu A&B, Covid) - Nasopharyngeal Swab     Status: None   Collection Time: 05/26/19  2:57 PM   Specimen: Nasopharyngeal Swab  Result Value Ref Range Status   SARS Coronavirus 2 by  RT PCR NEGATIVE NEGATIVE Final    Comment: (NOTE) SARS-CoV-2 target nucleic acids are NOT DETECTED. The SARS-CoV-2 RNA is generally detectable in upper respiratoy specimens during the acute phase of infection. The lowest concentration of SARS-CoV-2 viral copies this assay can detect is 131 copies/mL. A negative result does not preclude SARS-Cov-2 infection and should not be used as the sole basis for treatment or other patient management decisions. A negative result may occur with  improper specimen collection/handling, submission of specimen other than nasopharyngeal swab, presence of viral mutation(s) within  the areas targeted by this assay, and inadequate number of viral copies (<131 copies/mL). A negative result must be combined with clinical observations, patient history, and epidemiological information. The expected result is Negative. Fact Sheet for Patients:  PinkCheek.be Fact Sheet for Healthcare Providers:  GravelBags.it This test is not yet ap proved or cleared by the [place] FDA and  has been authorized for detection and/or diagnosis of SARS-CoV-2 by FDA under an Emergency Use Authorization (EUA). This EUA will remain  in effect (meaning this test can be used) for the duration of the COVID-19 declaration under Section 564(b)(1) of the Act, 21 U.S.C. section 360bbb-3(b)(1), unless the authorization is terminated or revoked sooner.    Influenza A by PCR NEGATIVE NEGATIVE Final   Influenza B by PCR NEGATIVE NEGATIVE Final    Comment: (NOTE) The Xpert Xpress SARS-CoV-2/FLU/RSV assay is intended as an aid in  the diagnosis of influenza from Nasopharyngeal swab specimens and  should not be used as a sole basis for treatment. Nasal washings and  aspirates are unacceptable for Xpert Xpress SARS-CoV-2/FLU/RSV  testing. Fact Sheet for Patients: PinkCheek.be Fact Sheet for Healthcare  Providers: GravelBags.it This test is not yet approved or cleared by the [place] FDA and  has been authorized for detection and/or diagnosis of SARS-CoV-2 by  FDA under an Emergency Use Authorization (EUA). This EUA will remain  in effect (meaning this test can be used) for the duration of the  Covid-19 declaration under Section 564(b)(1) of the Act, 21  U.S.C. section 360bbb-3(b)(1), unless the authorization is  terminated or revoked. Performed at [facility], [address] [place], [place] [address]   Blood Culture ID Panel (Reflexed)     Status: Abnormal   Collection Time: 05/26/19  2:57 PM  Result Value Ref Range Status   Enterococcus species NOT DETECTED NOT DETECTED Final   Listeria monocytogenes NOT DETECTED NOT DETECTED Final   Staphylococcus species NOT DETECTED NOT DETECTED Final   Staphylococcus aureus (BCID) NOT DETECTED NOT DETECTED Final   Streptococcus species DETECTED (A) NOT DETECTED Final    Comment: Not Enterococcus species, Streptococcus agalactiae, Streptococcus pyogenes, or Streptococcus pneumoniae. CRITICAL RESULT CALLED TO, READ BACK BY AND VERIFIED WITH: DAVID BESANTI 05/27/19 AT 1962 HS    Streptococcus agalactiae NOT DETECTED NOT DETECTED Final   Streptococcus pneumoniae NOT DETECTED NOT DETECTED Final   Streptococcus pyogenes NOT DETECTED NOT DETECTED Final   Acinetobacter baumannii NOT DETECTED NOT DETECTED Final   Enterobacteriaceae species NOT DETECTED NOT DETECTED Final   Enterobacter cloacae complex NOT DETECTED NOT DETECTED Final   Escherichia coli NOT DETECTED NOT DETECTED Final   Klebsiella oxytoca NOT DETECTED NOT DETECTED Final   Klebsiella pneumoniae NOT DETECTED NOT DETECTED Final   Proteus species NOT DETECTED NOT DETECTED Final   Serratia marcescens NOT DETECTED NOT DETECTED Final   Haemophilus influenzae NOT DETECTED NOT DETECTED Final   Neisseria meningitidis NOT DETECTED NOT DETECTED Final    Pseudomonas aeruginosa NOT DETECTED NOT DETECTED Final   Candida albicans NOT DETECTED NOT DETECTED Final   Candida glabrata NOT DETECTED NOT DETECTED Final   Candida krusei NOT DETECTED NOT DETECTED Final   Candida parapsilosis NOT DETECTED NOT DETECTED Final   Candida tropicalis NOT DETECTED NOT DETECTED Final    Comment: Performed at [facility], [address] [place], [place] [address]  Culture, blood (x 2)     Status: None   Collection Time: 05/27/19  6:54 PM   Specimen: BLOOD  Result Value Ref Range Status   Specimen Description BLOOD BLOOD RIGHT HAND  Final   Special Requests   Final    BOTTLES DRAWN AEROBIC AND ANAEROBIC Blood Culture adequate volume   Culture   Final    NO GROWTH 5 DAYS Performed at [facility], [address] [place], [place] [address]    Report Status 06/01/2019 FINAL  Final  Culture, blood (x 2)     Status: None   Collection Time: 05/27/19  6:56 PM   Specimen: BLOOD  Result Value Ref Range Status   Specimen Description BLOOD BLOOD LEFT HAND  Final   Special Requests   Final    BOTTLES DRAWN AEROBIC AND ANAEROBIC Blood Culture results may not be optimal due to an inadequate volume of blood received in culture bottles   Culture   Final    NO GROWTH 5 DAYS Performed at [facility], [address], [place], [place] [address]    Report Status 06/01/2019 FINAL  Final     Studies: No results found.  Scheduled Meds: . ammonium lactate  1 application Topical BID  . vitamin C  250 mg Oral BID  . atorvastatin  40 mg Oral Daily  . Chlorhexidine Gluconate Cloth  6 each Topical Daily  . dronabinol  2.5 mg Oral BID AC  . feeding supplement (ENSURE ENLIVE)  237 mL Oral BID  . feeding supplement (PRO-STAT SUGAR FREE 64)  30 mL Oral BID  . ferrous sulfate  325 mg Oral BID WC  . fluticasone  2 spray Each Nare Daily  . folic acid  1 mg Oral Daily  . furosemide  20 mg Oral Daily  . insulin aspart  0-9 Units Subcutaneous  TID WC  . lactulose  20 g Oral TID  . mirtazapine  30 mg Oral QHS  . multivitamin with minerals  1 tablet Oral Daily  . sodium chloride flush  10-40 mL Intracatheter Q12H   Continuous Infusions: . sodium chloride 250 mL (06/04/19 0616)  . ampicillin-sulbactam (UNASYN) IV 3 g (06/04/19 1159)    Assessment/Plan:  1. Sepsis with group C streptococcus with acute kidney injury.  This was present on admission.  Antibiotics were switched over to Unasyn yesterday.  TEE showing no vegetations.  Repeat blood cultures are negative.  Patient has a PICC line.  CT scan of the abdomen not showing any signs of infection.  MRI of the right ankle negative for osteomyelitis but does show soft tissue swelling.  CPK normal range.  White blood cell count still high and sedimentation rate and CRP elevated.  Continue antibiotics.  Will likely need 4 weeks of IV Rocephin 2 g daily with end date being June 6 as per ID. 2. Anemia of chronic disease.  Patient given 1 unit of packed red blood cells during the hospital course.  Hemoglobin 7.8. 3. Acute metabolic encephalopathy.  Ammonia level only 17. Patient on lactulose.  Mental status likely impaired secondary to sepsis. 4. Hypotension.  Blood pressure now normal off antihypertensives.  Continue low-dose Lasix. 5. Acute kidney injury on chronic kidney disease stage IIIb.  Creatinine peaked at 2.37 and has come down to 1.82.  Watch on low-dose Lasix. 6. Pressure injury posterior thigh present on admission 7. Bilateral lower extremity wounds and right leg swelling and erythema.  Continue antibiotics.  Podiatry ordered for Unna boots to be changed twice a week 8. 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  Patient on sliding  scale insulin 9. Hyperlipidemia on Lipitor 10. Immobility 11. Poor nutritional status and poor intake.  Patient already on Remeron at night we will start low-dose Marinol.  Dietitian mentioned that we can place a Dobbhoff tube and  feed.  Can consider tomorrow if still not eating.  Spoke with husband who will talk with her about the not eating and also encourage her to eat.  If she does not eat she will not survive.  Pressure Injury 05/05/19 Thigh Distal;Left;Posterior Unstageable - Full thickness tissue loss in which the base of the injury is covered by slough (yellow, tan, gray, green or brown) and/or eschar (tan, brown or black) in the wound bed. 4"x4" area with m (Active)  05/05/19 0700  Location: Thigh  Location Orientation: Distal;Left;Posterior  Staging: Unstageable - Full thickness tissue loss in which the base of the injury is covered by slough (yellow, tan, gray, green or brown) and/or eschar (tan, brown or black) in the wound bed.  Wound Description (Comments): 4"x4" area with mixture of pink granulated wound bed and yellow slough.  Present on Admission: Yes       Code Status:     Code Status Orders  (From admission, onward)         Start     Ordered   05/28/19 1030  Full code  Continuous     05/28/19 1029        Code Status History    Date Active Date Inactive Code Status Order ID Comments User Context   05/04/2019 1534 05/10/2019 2332 Full Code [id number]  Debbe Odea, MD ED   02/23/2019 2229 02/27/2019 1835 Full Code [id number]  Lenore Cordia, MD ED   12/16/2018 1704 01/02/2019 1845 Full Code [id number]  Eugenie Filler, MD ED   11/20/2018 1812 12/08/2018 0528 Full Code [id number]  Modena Nunnery D, DO ED   10/26/2018 0048 11/08/2018 0023 Full Code [id number]  Lang Snow, NP ED   08/17/2018 1820 08/22/2018 1805 Full Code [id number]  Dustin Flock, MD Inpatient   06/20/2018 2312 06/27/2018 2119 Full Code [id number]  Mayer Camel, NP ED   01/13/2018 1540 01/16/2018 1859 Full Code [id number]  Loletha Grayer, MD ED   11/03/2017 1758 11/06/2017 2136 Full Code [id number]  Salary, Avel Peace, MD Inpatient   09/26/2017 2353 09/30/2017 1404 Full Code [id number]  Lance Coon, MD Inpatient    08/27/2017 0347 08/29/2017 2054 Full Code [id number]  Harrie Foreman, MD Inpatient   08/07/2017 1704 08/09/2017 2237 Full Code [id number]  Saundra Shelling, MD Inpatient   12/18/2016 1146 12/26/2016 2032 Full Code [id number]  Vaughan Basta, MD Inpatient   12/18/2016 0959 12/18/2016 1146 Full Code [id number]  Vaughan Basta, MD ED   12/04/2015 0509 12/07/2015 1756 Full Code [id number]  Holley Raring, NP ED   10/28/2015 1640 10/29/2015 1711 Full Code [id number]  Bettey Costa, MD Inpatient   08/29/2015 1438 08/30/2015 1750 Full Code [id number]  Hugelmeyer, Ubaldo Glassing, DO Inpatient   Advance Care Planning Activity     Family Communication: spoke with husband on the phone at [phone].  Patient cell phone is [phone]. Disposition Plan: Status is: Inpatient  Dispo: The patient is from: Home              Anticipated d/c is to: Rehab              Anticipated d/c date is: When we are able to obtain a rehab bed.  Patient currently receiving IV antibiotics.  Patient has a PICC line and will require antibiotics through 06/23/2019.  Transitional care team looking into rehab options.  Consultants:  Infectious disease  Podiatry  Procedures:  PICC line  Antibiotics:  Antibiotics changed over to Unasyn  Time spent: 27 minutes, case discussed with dietitian  Loletha Grayer  [facility]

## 2019-06-05 ENCOUNTER — Inpatient Hospital Stay: Payer: Self-pay

## 2019-06-05 DIAGNOSIS — M7989 Other specified soft tissue disorders: Secondary | ICD-10-CM

## 2019-06-05 LAB — CBC
HCT: 28.3 % — ABNORMAL LOW (ref 36.0–46.0)
Hemoglobin: 8.1 g/dL — ABNORMAL LOW (ref 12.0–15.0)
MCH: 22.2 pg — ABNORMAL LOW (ref 26.0–34.0)
MCHC: 28.6 g/dL — ABNORMAL LOW (ref 30.0–36.0)
MCV: 77.5 fL — ABNORMAL LOW (ref 80.0–100.0)
Platelets: 206 10*3/uL (ref 150–400)
RBC: 3.65 MIL/uL — ABNORMAL LOW (ref 3.87–5.11)
RDW: 26.5 % — ABNORMAL HIGH (ref 11.5–15.5)
WBC: 16.2 10*3/uL — ABNORMAL HIGH (ref 4.0–10.5)
nRBC: 0.1 % (ref 0.0–0.2)

## 2019-06-05 LAB — GLUCOSE, CAPILLARY
Glucose-Capillary: 120 mg/dL — ABNORMAL HIGH (ref 70–99)
Glucose-Capillary: 181 mg/dL — ABNORMAL HIGH (ref 70–99)
Glucose-Capillary: 178 mg/dL — ABNORMAL HIGH (ref 70–99)
Glucose-Capillary: 178 mg/dL — ABNORMAL HIGH (ref 70–99)

## 2019-06-05 LAB — BASIC METABOLIC PANEL
Anion gap: 10 (ref 5–15)
BUN: 33 mg/dL — ABNORMAL HIGH (ref 8–23)
CO2: 24 mmol/L (ref 22–32)
Calcium: 8.1 mg/dL — ABNORMAL LOW (ref 8.9–10.3)
Chloride: 108 mmol/L (ref 98–111)
Creatinine, Ser: 1.77 mg/dL — ABNORMAL HIGH (ref 0.44–1.00)
GFR calc Af Amer: 35 mL/min — ABNORMAL LOW (ref >60)
GFR calc non Af Amer: 30 mL/min — ABNORMAL LOW (ref >60)
Glucose, Bld: 127 mg/dL — ABNORMAL HIGH (ref 70–99)
Potassium: 4.7 mmol/L (ref 3.5–5.1)
Sodium: 142 mmol/L (ref 135–145)

## 2019-06-05 LAB — SARS CORONAVIRUS 2 BY RT PCR (HOSPITAL ORDER, PERFORMED IN ~~LOC~~ HOSPITAL LAB): SARS Coronavirus 2: NEGATIVE

## 2019-06-05 MED ORDER — FUROSEMIDE 20 MG PO TABS: 20.0000 mg | ORAL_TABLET | Freq: Every day | ORAL | 0 refills | Status: DC

## 2019-06-05 MED ORDER — AMOXICILLIN-POT CLAVULANATE 875-125 MG PO TABS: 1.0000 | ORAL_TABLET | Freq: Two times a day (BID) | ORAL | 0 refills | Status: AC

## 2019-06-05 MED ORDER — DRONABINOL 2.5 MG PO CAPS: 2.5000 mg | ORAL_CAPSULE | Freq: Two times a day (BID) | ORAL | 0 refills | Status: DC

## 2019-06-05 MED ORDER — HYDROCODONE-ACETAMINOPHEN 5-325 MG PO TABS: 2.0000 | ORAL_TABLET | Freq: Three times a day (TID) | ORAL | 0 refills | Status: AC | PRN

## 2019-06-05 MED ORDER — AMOXICILLIN-POT CLAVULANATE 875-125 MG PO TABS
1.0000 | ORAL_TABLET | Freq: Two times a day (BID) | ORAL | Status: DC
  Administered 2019-06-05 – 2019-06-06 (×3): 1 via ORAL
  Filled 2019-06-05 (×3): qty 1

## 2019-06-05 MED ORDER — PREGABALIN 75 MG PO CAPS: 75.0000 mg | ORAL_CAPSULE | Freq: Two times a day (BID) | ORAL | 0 refills | Status: DC

## 2019-06-05 MED ORDER — HYDROCERIN EX CREA: 1.0000 "application " | TOPICAL_CREAM | Freq: Two times a day (BID) | CUTANEOUS | 0 refills | Status: DC

## 2019-06-05 MED ORDER — AMOXICILLIN-POT CLAVULANATE 875-125 MG PO TABS: 1.0000 | ORAL_TABLET | Freq: Two times a day (BID) | ORAL | 0 refills | Status: DC

## 2019-06-05 MED ORDER — HYDROCERIN EX CREA
TOPICAL_CREAM | Freq: Two times a day (BID) | CUTANEOUS | Status: DC
  Filled 2019-06-05: qty 113

## 2019-06-05 NOTE — Progress Notes (Addendum)
ID Pt pulled the picc out as it was irritating her last evening Says there is no guarantee she will keep another PICC  Says she prefers to take oral medication. Pt does not want me to look at her leg' says the leg hurt even before I touched the leg- she received pain med as well She is verbally aggressive and abusive I still convinced her to look at her leg and with help of PT  Nate made her sit on the edge of the bed for the first time for a few minutes and looked at her leg and wrapped a new dressing The rt leg is less swollen and the deroofed blisters are exfoliating     Patient Vitals for the past 24 hrs:  BP Temp Temp src Pulse Resp SpO2 Weight  06/05/19 1057 (!) 126/58 98.2 F (36.8 C) Oral 86 18 93 % --  06/05/19 0728 (!) 126/56 98.4 F (36.9 C) Oral 88 19 92 % --  06/05/19 0517 (!) 114/51 98.4 F (36.9 C) Oral 89 20 94 % 116 kg  06/04/19 1951 123/64 98.4 F (36.9 C) Oral 85 20 96 % --  06/04/19 1624 (!) 104/33 -- -- 85 18 94 % --    Anasarca improving Rt arm pt has scratch marks  Previous PICC site has a bruise   LABS CBC Latest Ref Rng & Units 06/05/2019 06/04/2019 06/03/2019  WBC 4.0 - 10.5 K/uL 16.2(H) 20.3(H) 20.3(H)  Hemoglobin 12.0 - 15.0 g/dL 8.1(L) 7.8(L) 8.1(L)  Hematocrit 36.0 - 46.0 % 28.3(L) 27.4(L) 27.2(L)  Platelets 150 - 400 K/uL 206 192 185    CMP Latest Ref Rng & Units 06/05/2019 06/04/2019 06/03/2019  Glucose 70 - 99 mg/dL 127(H) 182(H) 159(H)  BUN 8 - 23 mg/dL 33(H) 33(H) 35(H)  Creatinine 0.44 - 1.00 mg/dL 1.77(H) 1.82(H) 1.84(H)  Sodium 135 - 145 mmol/L 142 140 140  Potassium 3.5 - 5.1 mmol/L 4.7 4.8 4.6  Chloride 98 - 111 mmol/L 108 106 105  CO2 22 - 32 mmol/L 24 26 24   Calcium 8.9 - 10.3 mg/dL 8.1(L) 8.1(L) 8.3(L)  Total Protein 6.5 - 8.1 g/dL - - -  Total Bilirubin 0.3 - 1.2 mg/dL - - -  Alkaline Phos 38 - 126 U/L - - -  AST 15 - 41 U/L - - -  ALT 0 - 44 U/L - - -   MRI foot and ankle There is diffuse subcutaneous edema and skin  thickening seen surrounding the forefoot. No focal area of ulceration or soft tissue abscess. No sinus tract are seen.  IMPRESSION:  No evidence of osteomyelitis or soft tissue abscess.or sinus  Impression/recommendation  Group C streptococcus bacteremia :this is askin/GI/oral organisms. source Rt leg -She had same bacteremia last year. 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

## 2019-06-05 NOTE — Consult Note (Addendum)
MEDS-TO-BEDs Consult  Heart Failure Medication Review EF 50-55 Date 05/06/2019 (recommend a new echo if > 1 year) Medication Class Previous/Home Med Discharge/ Inpatient Changes  ACEI/ARB/ARNI    BB Coreg 3.125 mg BID Holding  ARA Spironolactone 12.5 mg daily Holding  Loop Torsemide 20 mg daily Lasix 20 mg daily  Other     Other medications to be filled at discharge: IV ceftriaxone,   Patient to be discharged to rehab facility and will need IV abx. Will defer meds-to-beds.   Eleonore Chiquito, PharmD, BCPS

## 2019-06-05 NOTE — Discharge Summary (Addendum)
[facility] Discharge Summary   Patient: [name] KVQ:[id number]  PCP: [name]  Date of admission: 05/26/2019   Date of discharge:  06/05/2019     Discharge Diagnoses:  Principal diagnosis Sepsis with Group C strep from cellulitis of ankle POA   Active Problems:   Sepsis ([place])   COPD (chronic obstructive pulmonary disease) ([place])   Anemia of chronic disease   Acute on chronic diastolic CHF (congestive heart failure) ([place])   Diabetes mellitus type 2, uncontrolled ([place])   Diabetic polyneuropathy associated with type 2 diabetes mellitus ([place])   Impaired renal function   Obesity   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[place])   Sleep apnea   Altered mental status   CKD stage 3 due to type 2 diabetes mellitus ([place])   Anasarca   Cellulitis   Diabetic foot ulcer ([facility])   Decreased level of consciousness   Goals of care, counseling/discussion   Palliative care by specialist   Acute metabolic encephalopathy   Hypotension   Acute kidney injury superimposed on CKD ([place])   DNR (do not resuscitate) discussion   Type 2 diabetes mellitus with stage 3b chronic kidney disease, with long-term current use of insulin ([place])   Admitted From: home Disposition:  SNF  Unable to d/c on 06/05/2019 due to lack of bed availability.  Recommendations for Outpatient Follow-up:  1. PCP: follow up in 1 week 2. Follow up LABS/TEST:  CBC and CMP weekly, fax to [phone].  Follow-up Information    [name] Follow up on 06/13/2019.   Specialty: Cardiology Why: at 10:30 am. Enter through the [place] entrance Contact information: [place] [employer] [place]. Schedule an appointment as soon as possible for a visit in 1 week(s).   Specialty: Wound Care Contact information: [address] [phone] ar [name]. Schedule an appointment as soon as possible for a visit in 1 week(s).   Contact information: [place] [address] [phone]        Tanya Billing, MD. Schedule an appointment as soon as possible for a visit in 2 week(s).   Specialty: Infectious Diseases Why: virtual visit Contact information: [place] [address] [phone]          Diet recommendation: Carb modified diet  Activity: The patient is advised to gradually reintroduce usual activities, as tolerated  Discharge Condition: stable  Code Status: Full code   History of present illness: As per the H and P dictated on admission, "The patient is a morbidly obese, chronically ill appearing 64 yr old woman. She presents to [facility] today with above chief complaints. She was discharged from this facility on 05/10/2019 after a stay for acute on chronic diastolic congestive heart failure with anasarca, acute on chronic anemia, CKD IIIB, hepatitis C with cirrhosis, and chronic venous ulcers in the leg.  All HPI is per ED documentation as the patient is chemically obtunded and unable to participate with history or exam.  Per the ED staff the patient has had difficulty with walking in the past 3 days. She has had significant pain in her right foot. She has some chronic shortness of breath.  In the ED she had pain with movement of the foot. She was afebrilel, HR in the 70's and RR in the low twenties  for the most part. Oxygen saturations were in the upper nineties."  Hospital Course:   Summary of her active problems in the hospital is as following. 1. Sepsis with group C streptococcus with acute kidney injury.  This was present on admission.  Antibiotics were switched over to Unasyn yesterday.  TEE showing no vegetations.  Repeat blood cultures are negative.  Patient has a PICC line.  CT scan of the abdomen not showing any signs of infection.  MRI of the right  ankle negative for osteomyelitis but does show soft tissue swelling.  CPK normal range. White blood cell count still high and sedimentation rate and CRP elevated.  Continue antibiotics.  2 weeks of oral Antibiotics and follow up in clinic for further Antibiotics treatment as per ID. Weekly CBC and CMP 2. Anemia of chronic disease.  Patient given 1 unit of packed red blood cells during the hospital course.  Hemoglobin stable. 3. Acute metabolic encephalopathy. Ammonia level only 17. Patient on lactulose.  Mental status likely impaired secondary to sepsis. 4. Hypotension.  Blood pressure now normal off antihypertensives. Continue low-dose Lasix. 5. Acute kidney injury on chronic kidney disease stage IIIb.  Creatinine peaked at 2.37 and has come down to 1.82.  Watch on low-dose Lasix. 6. Pressure injury posterior thigh present on admission 7. Bilateral lower extremity wounds and right leg swelling and erythema. Continue antibiotics.  Podiatry ordered for Unna boots to be changed twice a week 8. 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  Patient on sliding scale insulin 9. Hyperlipidemia on Lipitor 10. Immobility 11. Poor nutritional status and poor intake.  Patient already on Remeron at night we will start low-dose Marinol.  Dietitian mentioned that we can place a Dobbhoff tube and feed.  at present my recommendation is to continue medication adjustment and nutritional supplements.no indication for feeding tube. 12. Obesity morbidBody mass index is 42.56 kg/m.  Nutrition Problem: Increased nutrient needs Etiology: wound healing Nutrition Interventions: Interventions: Ensure Enlive (each supplement provides 350kcal and 20 grams of protein), MVI  Pressure Injury 05/05/19 Thigh Distal;Left;Posterior Unstageable - Full thickness tissue loss in which the base of the injury is covered by slough (yellow, tan, gray, green or brown) and/or eschar (tan, brown or black) in the wound bed.  4"x4" area with m (Active)  05/05/19 0700  Location: Thigh  Location Orientation: Distal;Left;Posterior  Staging: Unstageable - Full thickness tissue loss in which the base of the injury is covered by slough (yellow, tan, gray, green or brown) and/or eschar (tan, brown or black) in the wound bed.  Wound Description (Comments): 4"x4" area with mixture of pink granulated wound bed and yellow slough.  Present on Admission: Yes    Pain control  - [place] Controlled Substance Reporting System database was reviewed. - 5 day supply was provided. - Patient was instructed, not to drive, operate heavy machinery, perform activities at heights, swimming or participation in water activities or provide baby sitting services while on Pain, Sleep and Anxiety Medications; until her outpatient Physician has advised to do so again.  - Also recommended to not to take more than prescribed Pain, Sleep and Anxiety Medications.  Patient was seen by physical therapy, who recommended SNF, which was arranged. On the day of the discharge the patient's vitals were stable, and no other acute medical condition were reported by patient. the patient was felt safe to be discharge at SNF with therapy.  Consultants: ID, podiatry Procedures: TEE  Discharge Exam: General: Appear  in mild distress, bilateral leg Rash; Oral Mucosa Clear, moist. Cardiovascular: S1 and S2 Present, no Murmur, Respiratory: normal respiratory effort, Bilateral Air entry present and no Crackles, no wheezes Abdomen: Bowel Sound present, Soft and no tenderness, no hernia Extremities: bilateral  Pedal edema, no calf tenderness Neurology: alert and oriented to time, place, and person affect flat in affect.  Filed Weights   06/03/19 0543 06/04/19 0508 06/05/19 0517  Weight: 119.1 kg 116.7 kg 116 kg   Vitals:   06/05/19 0728 06/05/19 1057  BP: (!) 126/56 (!) 126/58  Pulse: 88 86  Resp: 19 18  Temp: 98.4 F (36.9 C) 98.2 F (36.8 C)    SpO2: 92% 93%    DISCHARGE MEDICATION: Allergies as of 06/05/2019      Reactions   Latex Anaphylaxis, Rash   Gabapentin Nausea And Vomiting, Other (See Comments)      Medication List    STOP taking these medications   carvedilol 3.125 MG tablet Commonly known as: COREG   senna-docusate 8.6-50 MG tablet Commonly known as: Senokot-S   spironolactone 25 MG tablet Commonly known as: ALDACTONE   torsemide 20 MG tablet Commonly known as: DEMADEX     TAKE these medications   ammonium lactate 12 % cream Commonly known as: AMLACTIN Apply 1 g topically 2 (two) times daily.   amoxicillin-clavulanate 875-125 MG tablet Commonly known as: AUGMENTIN Take 1 tablet by mouth 2 (two) times daily for 21 days.   atorvastatin 40 MG tablet Commonly known as: LIPITOR Take 40 mg by mouth daily.   collagenase ointment Commonly known as: SANTYL Apply topically daily.   dronabinol 2.5 MG capsule Commonly known as: MARINOL Take 1 capsule (2.5 mg total) by mouth 2 (two) times daily before lunch and supper.   feeding supplement (PRO-STAT SUGAR FREE 64) Liqd Take 30 mLs by mouth 2 (two) times daily.   ferrous sulfate 325 (65 FE) MG tablet Take 1 tablet (325 mg total) by mouth 2 (two) times daily with a meal.   fluticasone 50 MCG/ACT nasal spray Commonly known as: FLONASE Place 2 sprays into both nostrils daily.   folic acid 1 MG tablet Commonly known as: FOLVITE Take 1 tablet (1 mg total) by mouth daily.   furosemide 20 MG tablet Commonly known as: LASIX Take 1 tablet (20 mg total) by mouth daily. Start taking on: Jun 06, 2019   hydrocerin Crea Apply 1 application topically 2 (two) times daily.   HYDROcodone-acetaminophen 5-325 MG tablet Commonly known as: NORCO/VICODIN Take 2 tablets by mouth 3 (three) times daily as needed for up to 5 days for moderate pain or severe pain.   lactulose 10 GM/15ML solution Commonly known as: CHRONULAC Take 30 mLs (20 g total) by mouth 3  (three) times daily.   mirtazapine 30 MG tablet Commonly known as: REMERON Take 30 mg by mouth at bedtime.   multivitamin with minerals Tabs tablet Take 1 tablet by mouth daily.   pregabalin 75 MG capsule Commonly known as: LYRICA Take 1 capsule (75 mg total) by mouth 2 (two) times daily. What changed:   medication strength  how much to take   RESOURCE 2.0 PO Take 1 Bottle by mouth 2 (two) times daily.            Discharge Care Instructions  (From admission, onward)         Start     Ordered   06/05/19 0000  Discharge wound care:    Comments: Cleanse legs with soap  and water and pat dry.  Apply alginate to nonintact wounds to legs and heels.  Wrap with kerlix and secure with coban. Change Monday and Thursday.   06/05/19 1315         Allergies  Allergen Reactions  . Latex Anaphylaxis and Rash  . Gabapentin Nausea And Vomiting and Other (See Comments)   Discharge Instructions    Diet - low sodium heart healthy   Complete by: As directed    Discharge wound care:   Complete by: As directed    Cleanse legs with soap and water and pat dry.  Apply alginate to nonintact wounds to legs and heels.  Wrap with kerlix and secure with coban. Change Monday and Thursday.   Increase activity slowly   Complete by: As directed       The results of significant diagnostics from this hospitalization (including imaging, microbiology, ancillary and laboratory) are listed below for reference.    Significant Diagnostic Studies: CT ABDOMEN PELVIS WO CONTRAST  Result Date: 05/31/2019 CLINICAL DATA:  Sepsis, CHF and COPD, upper GI bleed with bacteremia EXAM: CT ABDOMEN AND PELVIS WITHOUT CONTRAST TECHNIQUE: Multidetector CT imaging of the abdomen and pelvis was performed following the standard protocol without IV contrast. COMPARISON:  October 27, 2018 FINDINGS: Lower chest: There are moderate bilateral pleural effusions with adjacent passive atelectasis. Underlying consolidation is  not excluded. There is cardiomegaly with calcification of mitral annulus. Hypoattenuation of the cardiac blood pool relative to the myocardium is suggestive of anemia. No pericardial effusion. Hepatobiliary: No worrisome focal liver lesions. Smooth liver surface contour. Normal hepatic attenuation. Patient is post cholecystectomy. Slight prominence of the biliary tree likely related to reservoir effect. No visible calcified intraductal gallstones. Pancreas: Unremarkable. No pancreatic ductal dilatation or surrounding inflammatory changes. Spleen: Normal in size without focal abnormality. Adrenals/Urinary Tract: Mild edematous thickening of the left adrenal gland, similar to prior. No suspicious adrenal lesions. Stable appearance of the multiple fluid attenuation cysts throughout both kidneys largest being an exophytic cyst in the left lower pole measuring up to 5.4 cm. Additional stable 14 mm hyperdense cystic structure along the anterior interpolar left kidney unchanged in size but with increased attenuation since 2014 which likely reflects a hemorrhagic or proteinaceous cyst. No concerning new renal masses. Few punctate vascular calcifications versus nonobstructing uroliths in the upper poles both kidneys. No obstructive urolithiasis or hydronephrosis. Moderate bilateral perinephric stranding is similar to comparison study. Urinary bladder is unremarkable. Stomach/Bowel: Normal esophagus, stomach and duodenum. No small bowel thickening or dilatation. High attenuation contrast media passes to the level of the cecum. Appendix is not visualized. No focal inflammation the vicinity of the cecum to suggest an occult appendicitis. No colonic dilatation or wall thickening. Scattered colonic diverticula without focal pericolonic inflammation to suggest diverticulitis. Vascular/Lymphatic: Atherosclerotic calcifications throughout the abdominal aorta and branch vessels. No aneurysm or ectasia. Multiple borderline enlarged  inguinal nodes are similar to prior and likely reactive. Additional prominent retroperitoneal nodes including a 16 mm retrocaval node (2/38) are unchanged from comparison study as well. Reproductive: Anteverted uterus. No concerning adnexal lesions. Other: Extensive severe body wall edema. Fat containing ventral hernia with portion of the small bowel closely approximating the 3.1 cm fascial defect (2/43). Additional fat containing umbilical hernia as well. No bowel containing hernias. Small volume low-attenuation fluid throughout the abdomen. Musculoskeletal: No acute osseous abnormality or suspicious osseous lesion. Multilevel degenerative changes are present in the imaged portions of the spine. Additional degenerative changes in the hips and pelvis. IMPRESSION:  1. Nonspecific bilateral perinephric stranding is similar to prior and possibly related age or diminished renal function though should correlate with urinalysis to exclude superimposed ascending tract infection. 2. Moderate bilateral pleural effusions with adjacent passive atelectasis. Underlying consolidation is not excluded. 3. Additional features of anasarca with severe body wall edema, edematous changes of the mesentery and small volume ascites. 4. Hypoattenuation of the cardiac blood pool relative to the myocardium is suggestive of anemia. 5. Stable size of a 14 mm hyperdense cystic structure in the left kidney, likely benign proteinaceous or hyperdense cyst. 6. Few punctate vascular calcifications versus nonobstructing uroliths in the upper poles both kidneys. 7. Fat containing ventral and umbilical hernias. No bowel containing hernia. 8. Aortic Atherosclerosis (ICD10-I70.0). Electronically Signed   By: Lovena Le M.D.   On: 05/31/2019 19:49   DG Tibia/Fibula Right  Result Date: 05/26/2019 CLINICAL DATA:  Pain status post fall EXAM: RIGHT TIBIA AND FIBULA - 2 VIEW COMPARISON:  None. FINDINGS: There is soft tissue swelling about the lower  extremity. The patient is status post prior plate screw fixation of the distal fibula. The hardware appears grossly intact with evidence for some loosening of the single transsyndesmotic screw. There may be a small suprapatellar joint effusion. There is no acute displaced fracture or dislocation. IMPRESSION: 1. No acute osseous abnormality. 2. There is nonspecific lower extremity edema. Electronically Signed   By: Constance Holster M.D.   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 CONTRAST:  41m GADAVIST GADOBUTROL 1 MMOL/ML IV SOLN COMPARISON:  None. FINDINGS: Bones/Joint/Cartilage Normal osseous marrow signal is seen throughout. No areas of cortical destruction or periosteal reaction. No areas of abnormal enhancement. No large joint effusions. The articular surfaces appear to be maintained. Ligaments The Lisfranc ligaments are intact. Muscles and Tendons There is diffuse feathery signal seen throughout the muscles surrounding the forefoot. No areas of abnormal enhancement are seen however within the muscles. The flexor and extensor tendons are intact. Soft tissues There is diffuse subcutaneous edema and skin thickening seen surrounding the forefoot. No focal area of ulceration or soft tissue abscess. No sinus tract are seen. IMPRESSION: No evidence of osteomyelitis or soft tissue abscess. Findings suggestive of diffuse muscular edema and cellulitis. Electronically Signed   By: BPrudencio PairM.D.   On: 06/01/2019 20:12   MR ANKLE RIGHT W WO CONTRAST  Result Date: 06/01/2019 CLINICAL DATA:  Foot swelling diabetic EXAM: MRI OF THE RIGHT ANKLE WITHOUT AND WITH CONTRAST TECHNIQUE: Multiplanar,  multisequence MR imaging of the was performed following the administration of intravenous contrast. CONTRAST:  131mGADAVIST GADOBUTROL 1 MMOL/ML IV SOLN COMPARISON:  None. FINDINGS: Bones/Joint/Cartilage There is surrounding metallic artifact from prior fixation at the distal tibia and fibula. Within the remainder of the osseous structures there is normal osseous marrow signal. No areas of cortical destruction or periosteal reaction. No large ankle or subtalar joint effusion. Ligaments Suboptimally visualized Muscles and Tendons There is diffuse muscular feathery signal seen throughout the ankle. No areas of abnormal enhancement. The visualized portions of the Achilles tendon is intact. There is somewhat limited visualization of the peroneal tendons, however the remainder of the flexor and extensor tendons are intact. Soft tissues Diffuse subcutaneous edema seen  surrounding the ankle with skin thickening. No definite sinus tract or loculated fluid collection. IMPRESSION: Prior fixation of the ankle with surrounding metallic artifact. No definite evidence of osteomyelitis or soft tissue abscess. Findings suggestive of diffuse muscular edema and cellulitis. Electronically Signed   By: Prudencio Pair M.D.   On: 06/01/2019 20:36   US Venous Img Lower Bilateral (DVT)  Result Date: 05/26/2019 CLINICAL DATA:  Lower extremity pain and swelling. EXAM: BILATERAL LOWER EXTREMITY VENOUS DOPPLER ULTRASOUND TECHNIQUE: Gray-scale sonography with compression, as well as color and duplex ultrasound, were performed to evaluate the deep venous system(s) from the level of the common femoral vein through the popliteal and proximal calf veins. COMPARISON:  None. FINDINGS: VENOUS Evaluation of the patient's lower extremities was severely limited by patient habitus and diffuse anasarca. Flow is visualized within all of the identified venous structures, however adequate compressibility could not be assessed. OTHER Extensive bilateral  lower extremity edema was noted. There are enlarged inguinal lymph nodes bilaterally. Several of the lymph nodes bilaterally appear to demonstrate diffusely thickened cortices. These were seen on the patient's prior CT of the pelvis from 10/26/2018. Limitations: Patient body habitus IMPRESSION: 1. Severely limited study secondary to patient body habitus and diffuse edema. No occlusive DVT was identified, however a nonocclusive DVT cannot be excluded in either lower extremity. 2. Enlarged bilateral inguinal lymph nodes of unknown clinical significance. These may be reactive, however malignant lymph nodes are not excluded. Electronically Signed   By: Constance Holster M.D.   On: 05/26/2019 21:26   DG Chest Port 1 View  Result Date: 05/26/2019 CLINICAL DATA:  Pain EXAM: PORTABLE CHEST 1 VIEW COMPARISON:  05/04/2019 FINDINGS: There is significant cardiomegaly. The appearance of the cardiac silhouette is unchanged since prior study. Areas of scarring atelectasis are again noted. The patient is status post prior median sternotomy with valve replacement. There is vascular congestion with mild interstitial pulmonary edema. There is no acute osseous abnormality. IMPRESSION: Cardiomegaly with mild interstitial pulmonary edema. Electronically Signed   By: Constance Holster M.D.   On: 05/26/2019 16:26   DG Foot 2 Views Right  Result Date: 05/26/2019 CLINICAL DATA:  Status post fall. Fatigue. EXAM: RIGHT FOOT - 2 VIEW COMPARISON:  February 2011 FINDINGS: There is no evidence of fracture or dislocation. There is no evidence of arthropathy or other focal bone abnormality. Diffuse soft tissue swelling. Partially visualize fibular fracture fixation. IMPRESSION: 1. No acute fracture or dislocation identified about the right foot. 2. Diffuse soft tissue swelling. Electronically Signed   By: Fidela Salisbury M.D.   On: 05/26/2019 16:27   [place] EKG SITE RITE  Result Date: 06/05/2019 If Site Rite image not attached, placement  could not be confirmed due to current cardiac rhythm.  [place] EKG SITE RITE  Result Date: 06/04/2019 If Site Rite image not attached, placement could not be confirmed due to current cardiac rhythm.  [place] EKG SITE RITE  Result Date: 05/30/2019 If Site Rite image not attached, placement could not be confirmed due to current cardiac rhythm.   Microbiology: Recent Results (from the past 240 hour(s))  Culture, blood (routine x 2)     Status: Abnormal   Collection Time: 05/26/19  2:57 PM   Specimen: BLOOD  Result Value Ref Range Status   Specimen Description   Final    BLOOD BLOOD LEFT FOREARM Performed at [facility], [address], [place], [place] [address]    Special Requests   Final    BOTTLES DRAWN  AEROBIC AND ANAEROBIC Blood Culture results may not be optimal due to an inadequate volume of blood received in culture bottles Performed at [facility], [address] [place], [place] [address]    Culture  Setup Time   Final    GRAM POSITIVE COCCI IN BOTH AEROBIC AND ANAEROBIC BOTTLES CRITICAL RESULT CALLED TO, READ BACK BY AND VERIFIED WITH: DAVID BESANTI 05/27/19 AT 0237 HS    Culture (A)  Final    STREPTOCOCCUS GROUP C SUSCEPTIBILITIES PERFORMED ON PREVIOUS CULTURE WITHIN THE LAST 5 DAYS. Performed at [facility], [place] [address], [place], [place] [address]    Report Status 05/29/2019 FINAL  Final  Culture, blood (routine x 2)     Status: Abnormal   Collection Time: 05/26/19  2:57 PM   Specimen: BLOOD  Result Value Ref Range Status   Specimen Description   Final    BLOOD LEFT ANTECUBITAL Performed at [facility], [address], [place], [place] [address]    Special Requests   Final    BOTTLES DRAWN AEROBIC AND ANAEROBIC Blood Culture adequate volume Performed at [facility], [address], [place], [place] [address]    Culture  Setup Time   Final    GRAM POSITIVE COCCI IN BOTH AEROBIC AND ANAEROBIC BOTTLES CRITICAL VALUE  NOTED.  VALUE IS CONSISTENT WITH PREVIOUSLY REPORTED AND CALLED VALUE. Performed at [facility], [place], [place] [address]    Culture STREPTOCOCCUS GROUP C (A)  Final   Report Status 05/29/2019 FINAL  Final   Organism ID, Bacteria STREPTOCOCCUS GROUP C  Final      Susceptibility   Streptococcus group c - MIC*    CLINDAMYCIN <=0.25 SENSITIVE Sensitive     AMPICILLIN <=0.25 SENSITIVE Sensitive     ERYTHROMYCIN <=0.12 SENSITIVE Sensitive     VANCOMYCIN 0.25 SENSITIVE Sensitive     CEFTRIAXONE <=0.12 SENSITIVE Sensitive     LEVOFLOXACIN 0.5 SENSITIVE Sensitive     PENICILLIN Value in next row Sensitive      SENSITIVE<=0.06    * STREPTOCOCCUS GROUP C  Respiratory Panel by RT PCR (Flu A&B, Covid) - Nasopharyngeal Swab     Status: None   Collection Time: 05/26/19  2:57 PM   Specimen: Nasopharyngeal Swab  Result Value Ref Range Status   SARS Coronavirus 2 by RT PCR NEGATIVE NEGATIVE Final    Comment: (NOTE) SARS-CoV-2 target nucleic acids are NOT DETECTED. The SARS-CoV-2 RNA is generally detectable in upper respiratoy specimens during the acute phase of infection. The lowest concentration of SARS-CoV-2 viral copies this assay can detect is 131 copies/mL. A negative result does not preclude SARS-Cov-2 infection and should not be used as the sole basis for treatment or other patient management decisions. A negative result may occur with  improper specimen collection/handling, submission of specimen other than nasopharyngeal swab, presence of viral mutation(s) within the areas targeted by this assay, and inadequate number of viral copies (<131 copies/mL). A negative result must be combined with clinical observations, patient history, and epidemiological information. The expected result is Negative. Fact Sheet for Patients:  PinkCheek.be Fact Sheet for Healthcare Providers:  GravelBags.it This test is not  yet ap proved or cleared by the [place] FDA and  has been authorized for detection and/or diagnosis of SARS-CoV-2 by FDA under an Emergency Use Authorization (EUA). This EUA will remain  in effect (meaning this test can be used) for the duration of the COVID-19 declaration under Section  564(b)(1) of the Act, 21 U.S.C. section 360bbb-3(b)(1), unless the authorization is terminated or revoked sooner.    Influenza A by PCR NEGATIVE NEGATIVE Final   Influenza B by PCR NEGATIVE NEGATIVE Final    Comment: (NOTE) The Xpert Xpress SARS-CoV-2/FLU/RSV assay is intended as an aid in  the diagnosis of influenza from Nasopharyngeal swab specimens and  should not be used as a sole basis for treatment. Nasal washings and  aspirates are unacceptable for Xpert Xpress SARS-CoV-2/FLU/RSV  testing. Fact Sheet for Patients: PinkCheek.be Fact Sheet for Healthcare Providers: GravelBags.it This test is not yet approved or cleared by the [place] FDA and  has been authorized for detection and/or diagnosis of SARS-CoV-2 by  FDA under an Emergency Use Authorization (EUA). This EUA will remain  in effect (meaning this test can be used) for the duration of the  Covid-19 declaration under Section 564(b)(1) of the Act, 21  U.S.C. section 360bbb-3(b)(1), unless the authorization is  terminated or revoked. Performed at [facility], [address] [place], [place] [address]   Blood Culture ID Panel (Reflexed)     Status: Abnormal   Collection Time: 05/26/19  2:57 PM  Result Value Ref Range Status   Enterococcus species NOT DETECTED NOT DETECTED Final   Listeria monocytogenes NOT DETECTED NOT DETECTED Final   Staphylococcus species NOT DETECTED NOT DETECTED Final   Staphylococcus aureus (BCID) NOT DETECTED NOT DETECTED Final   Streptococcus species DETECTED (A) NOT DETECTED Final    Comment: Not Enterococcus species, Streptococcus  agalactiae, Streptococcus pyogenes, or Streptococcus pneumoniae. CRITICAL RESULT CALLED TO, READ BACK BY AND VERIFIED WITH: DAVID BESANTI 05/27/19 AT 5364 HS    Streptococcus agalactiae NOT DETECTED NOT DETECTED Final   Streptococcus pneumoniae NOT DETECTED NOT DETECTED Final   Streptococcus pyogenes NOT DETECTED NOT DETECTED Final   Acinetobacter baumannii NOT DETECTED NOT DETECTED Final   Enterobacteriaceae species NOT DETECTED NOT DETECTED Final   Enterobacter cloacae complex NOT DETECTED NOT DETECTED Final   Escherichia coli NOT DETECTED NOT DETECTED Final   Klebsiella oxytoca NOT DETECTED NOT DETECTED Final   Klebsiella pneumoniae NOT DETECTED NOT DETECTED Final   Proteus species NOT DETECTED NOT DETECTED Final   Serratia marcescens NOT DETECTED NOT DETECTED Final   Haemophilus influenzae NOT DETECTED NOT DETECTED Final   Neisseria meningitidis NOT DETECTED NOT DETECTED Final   Pseudomonas aeruginosa NOT DETECTED NOT DETECTED Final   Candida albicans NOT DETECTED NOT DETECTED Final   Candida glabrata NOT DETECTED NOT DETECTED Final   Candida krusei NOT DETECTED NOT DETECTED Final   Candida parapsilosis NOT DETECTED NOT DETECTED Final   Candida tropicalis NOT DETECTED NOT DETECTED Final    Comment: Performed at [facility], [address] [place], [place] [address]  Culture, blood (x 2)     Status: None   Collection Time: 05/27/19  6:54 PM   Specimen: BLOOD  Result Value Ref Range Status   Specimen Description BLOOD BLOOD RIGHT HAND  Final   Special Requests   Final    BOTTLES DRAWN AEROBIC AND ANAEROBIC Blood Culture adequate volume   Culture   Final    NO GROWTH 5 DAYS Performed at [facility], [address] [place], [place] [address]    Report Status 06/01/2019 FINAL  Final  Culture, blood (x 2)     Status: None   Collection Time: 05/27/19  6:56 PM   Specimen: BLOOD  Result Value Ref Range Status   Specimen Description BLOOD  BLOOD LEFT HAND   Final   Special Requests   Final    BOTTLES DRAWN AEROBIC AND ANAEROBIC Blood Culture results may not be optimal due to an inadequate volume of blood received in culture bottles   Culture   Final    NO GROWTH 5 DAYS Performed at [facility], [address] [place], [place] [address]    Report Status 06/01/2019 FINAL  Final  SARS Coronavirus 2 by RT PCR (hospital order, performed in [facility] hospital lab) Nasopharyngeal Nasopharyngeal Swab     Status: None   Collection Time: 06/05/19 12:15 PM   Specimen: Nasopharyngeal Swab  Result Value Ref Range Status   SARS Coronavirus 2 NEGATIVE NEGATIVE Final    Comment: (NOTE) SARS-CoV-2 target nucleic acids are NOT DETECTED. The SARS-CoV-2 RNA is generally detectable in upper and lower respiratory specimens during the acute phase of infection. The lowest concentration of SARS-CoV-2 viral copies this assay can detect is 250 copies / mL. A negative result does not preclude SARS-CoV-2 infection and should not be used as the sole basis for treatment or other patient management decisions.  A negative result may occur with improper specimen collection / handling, submission of specimen other than nasopharyngeal swab, presence of viral mutation(s) within the areas targeted by this assay, and inadequate number of viral copies (<250 copies / mL). A negative result must be combined with clinical observations, patient history, and epidemiological information. Fact Sheet for Patients:   StrictlyIdeas.no Fact Sheet for Healthcare Providers: BankingDealers.co.za This test is not yet approved or cleared  by the [place] FDA and has been authorized for detection and/or diagnosis of SARS-CoV-2 by FDA under an Emergency Use Authorization (EUA).  This EUA will remain in effect (meaning this test can be used) for the duration of the COVID-19 declaration under Section 564(b)(1) of the Act, 21  U.S.C. section 360bbb-3(b)(1), unless the authorization is terminated or revoked sooner. Performed at [facility], [address] [place], [place] [address]      Labs: CBC: Recent Labs  Lab 06/01/19 0557 06/02/19 0746 06/03/19 0411 06/04/19 0818 06/05/19 0606  WBC 24.2* 25.2* 20.3* 20.3* 16.2*  NEUTROABS  --   --  18.4*  --   --   HGB 8.3* 7.8* 8.1* 7.8* 8.1*  HCT 28.1* 26.2* 27.2* 27.4* 28.3*  MCV 75.5* 75.1* 74.9* 78.5* 77.5*  PLT 198 186 185 192 762   Basic Metabolic Panel: Recent Labs  Lab 06/01/19 0557 06/02/19 0746 06/03/19 0411 06/04/19 0818 06/05/19 0606  NA 136 139 140 140 142  K 4.5 4.7 4.6 4.8 4.7  CL 104 106 105 106 108  CO2 24 23 24 26 24   GLUCOSE 159* 168* 159* 182* 127*  BUN 44* 40* 35* 33* 33*  CREATININE 1.95* 1.83* 1.84* 1.82* 1.77*  CALCIUM 8.0* 8.1* 8.3* 8.1* 8.1*   Liver Function Tests: No results for input(s): AST, ALT, ALKPHOS, BILITOT, PROT, ALBUMIN in the last 168 hours. No results for input(s): LIPASE, AMYLASE in the last 168 hours. Recent Labs  Lab 05/30/19 0437  AMMONIA 17   Cardiac Enzymes: Recent Labs  Lab 06/02/19 0746  CKTOTAL 49   BNP (last 3 results) Recent Labs    08/17/18 1030 05/04/19 1133 05/26/19 1457  BNP 861.0* 778.0* 827.0*   CBG: Recent Labs  Lab 06/04/19 1142 06/04/19 1624 06/04/19 2041 06/05/19 0728 06/05/19 1057  GLUCAP 178* 150* 148* 120* 181*    Time spent: 35 minutes  Signed:  Berle Mull  [facility]  06/05/2019 1:46 PM

## 2019-06-05 NOTE — Progress Notes (Signed)
Physical Therapy Treatment Patient Details Name: [name] MRN: [id number] DOB: [birth year] Today's Date: 06/05/2019    History of Present Illness Patient is a 64 year old female with sepsis, COPD, chronic diastolic CHF, DM type II, diabetic neuropathy, obesity, AMS, CKD stage III, hep C with cirrhosis, anasarca, cellulitis, diabetic foot ulcer, acute metabolic encephalopathy, hypotension, AKI superimposed on CKD, cervical cancer, nSTEMI. . Patient was discharged from this facility on 05/10/19; she refused to go to rehab despite therapy recommendation. Patient uses walker for transfers at home to/from manual w/c but is not able to propel w/c herself.    PT Comments    Pt was supine in bed upon arriving. She is alert throughout session and conversational. Slightly disoriented to date/time but is able to follow commands throughout. She was cooperative however once attempting to sit up begins yelling out in pain and is unable to achieve EOB sitting 2/2 to pain. +2 assist during trial but pt still unable. She had BM and required +2 assistance to roll and have hygiene performed. Pt was willing to perform there ex and overall tolerated well with minimal AAROM performed. Continued recommendation for   SNF at [facility]. Pt was in bed with bed alarm in place, call bell in reach, and RN aware of pt's abilities.    Follow Up Recommendations  SNF     Equipment Recommendations  Other (comment)(defer to next level of care)    Recommendations for Other Services       Precautions / Restrictions Precautions Precautions: Fall Restrictions Weight Bearing Restrictions: No    Mobility  Bed Mobility Overal bed mobility: Needs Assistance Bed Mobility: Rolling;Supine to Sit Rolling: Max assist;+2 for safety/equipment;+2 for physical assistance   Supine to sit: Total assist;+2 for safety/equipment;+2 for physical assistance Sit to supine: Total assist;+2 for physical assistance;+2 for safety/equipment    General bed mobility comments: Pt attempted to sit up EOB however upon attempting begins screaming out in pain. was unable to achieve full short sit EOB. she was returned to supine and repositioned to [facility]. RN tech assisted therapist with hygene care after pt had BM. drawsheets/pads changed and pt cleaned. Unable to progress OOB activity 2/2 to pain.  Transfers                 General transfer comment: unable  Ambulation/Gait                 Stairs             Wheelchair Mobility    Modified Rankin (Stroke Patients Only)       Balance                                            Cognition Arousal/Alertness: Awake/alert Behavior During Therapy: WFL for tasks assessed/performed Overall Cognitive Status: No family/caregiver present to determine baseline cognitive functioning                                 General Comments: Pt is alert and conversational. She states willingness to participate. She is slightly disoriented but able to follow commands consistently. Session very limited by pain      Exercises General Exercises - Lower Extremity Ankle Circles/Pumps: AROM;15 reps;Supine;Limitations Ankle Circles/Pumps Limitations: ROM is very limited Quad Sets: AROM;Both;5 reps;Supine Heel Slides: AAROM;Both;15 reps;Supine  Hip ABduction/ADduction: AAROM;Both;Supine;10 reps    General Comments        Pertinent Vitals/Pain Pain Assessment: 0-10 Pain Score: 9  Pain Location: bilateral LE's and UE's Pain Descriptors / Indicators: Aching;Tightness;Sharp Pain Intervention(s): Limited activity within patient's tolerance;Monitored during session;Repositioned    Home Living                      Prior Function            PT Goals (current goals can now be found in the care plan section) Acute Rehab PT Goals Patient Stated Goal: "to get better" Progress towards PT goals: Not progressing toward goals - comment(pain  limiting)    Frequency    Min 2X/week      PT Plan Current plan remains appropriate    Co-evaluation              AM-PAC PT "6 Clicks" Mobility   Outcome Measure  Help needed turning from your back to your side while in a flat bed without using bedrails?: A Lot Help needed moving from lying on your back to sitting on the side of a flat bed without using bedrails?: Total Help needed moving to and from a bed to a chair (including a wheelchair)?: Total Help needed standing up from a chair using your arms (e.g., wheelchair or bedside chair)?: Total Help needed to walk in hospital room?: Total Help needed climbing 3-5 steps with a railing? : Total 6 Click Score: 7    End of Session Equipment Utilized During Treatment: Gait belt Activity Tolerance: Patient limited by pain;Patient limited by fatigue Patient left: in bed;with call bell/phone within reach;with bed alarm set Nurse Communication: Mobility status PT Visit Diagnosis: Difficulty in walking, not elsewhere classified (R26.2);Other abnormalities of gait and mobility (R26.89);Muscle weakness (generalized) (M62.81);Pain Pain - Right/Left: Right Pain - part of body: Leg;Arm     Time: 1104-1130 PT Time Calculation (min) (ACUTE ONLY): 26 min  Charges:  $Therapeutic Exercise: 8-22 mins $Therapeutic Activity: 8-22 mins                     Julaine Fusi PTA 06/05/19, 12:35 PM

## 2019-06-06 ENCOUNTER — Ambulatory Visit: Payer: Medicaid Other | Admitting: Family

## 2019-06-06 LAB — GLUCOSE, CAPILLARY
Glucose-Capillary: 119 mg/dL — ABNORMAL HIGH (ref 70–99)
Glucose-Capillary: 162 mg/dL — ABNORMAL HIGH (ref 70–99)

## 2019-06-06 NOTE — Consult Note (Signed)
[facility] Nurse wound follow up Wound type:nonhelaing venous wounds.  Discharging to SNF today. Unna boots replaced Wednesday (yesterday).   Will recommend twice weekly at facility. No further WOC needs at this time.  Will not follow at this time.  Please re-consult if needed.  Domenic Moras MSN, RN, FNP-BC CWON Wound, Ostomy, Continence Nurse Pager [phone]

## 2019-06-06 NOTE — Progress Notes (Addendum)
Called to give report to nurse at [facility].   Spoke to [employer].    AVS documentation sent with EMS to facility

## 2019-06-06 NOTE — Discharge Summary (Signed)
[facility] Discharge Summary   Patient: [name] LOV:[id number]  PCP: [place]  Date of admission: 05/26/2019   Date of discharge:  06/06/2019     Discharge Diagnoses:  Principal diagnosis Sepsis with Group C strep from cellulitis of ankle POA   Active Problems:   Sepsis ([place])   COPD (chronic obstructive pulmonary disease) ([place])   Anemia of chronic disease   Acute on chronic diastolic CHF (congestive heart failure) ([place])   Diabetes mellitus type 2, uncontrolled ([place])   Diabetic polyneuropathy associated with type 2 diabetes mellitus ([place])   Impaired renal function   Obesity   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[place])   Sleep apnea   Altered mental status   CKD stage 3 due to type 2 diabetes mellitus ([place])   Anasarca   Cellulitis   Diabetic foot ulcer (H[facility])   Decreased level of consciousness   Goals of care, counseling/discussion   Palliative care by specialist   Acute metabolic encephalopathy   Hypotension   Acute kidney injury superimposed on CKD ([place])   DNR (do not resuscitate) discussion   Type 2 diabetes mellitus with stage 3b chronic kidney disease, with long-term current use of insulin ([place])   Admitted From: home Disposition:  SNF   Recommendations for Outpatient Follow-up:  1. PCP: follow up in 1 week 2. Follow up LABS/TEST:  CBC and CMP weekly, fax to [phone]. 3. Wound care : Apply xeroform gauze to right leg and left leg wounds Q day, then cover with ABD pads and kerlex, and ace wrap or coban with compression, use PRN pain med to assist with dressing changes.   Contact information for follow-up providers    [facility] Follow up on 06/13/2019.   Specialty: Cardiology Why: at 10:30 am. Enter through the [place] entrance Contact information: [address] [place]       [address]. Schedule an  appointment as soon as possible for a visit in 1 week(s).   Specialty: Wound Care Contact information: [address] [id number] [place]. Schedule an appointment as soon as possible for a visit in 1 week(s).   Contact information: [place] [address] [phone]        Tsosie Billing, MD. Schedule an appointment as soon as possible for a visit in 2 week(s).   Specialty: Infectious Diseases Why: virtual visit Contact information: [place] [address] [phone]            Contact information for after-discharge care    [facility] .   Service: Skilled Nursing Contact information: [place] [phone]                 Diet recommendation: Carb modified diet  Activity: The patient is advised to gradually reintroduce usual activities, as tolerated  Discharge Condition: stable  Code Status: Full code   History of present illness: As per the H and P dictated on admission, "The patient is a morbidly obese, chronically ill appearing 64 yr old woman. She presents to [facility] today with above chief complaints. She was discharged from this facility on 05/10/2019 after a stay for acute on chronic diastolic congestive heart failure with anasarca, acute on chronic anemia, CKD IIIB, hepatitis C with cirrhosis, and chronic venous ulcers  in the leg.  All HPI is per ED documentation as the patient is chemically obtunded and unable to participate with history or exam.  Per the ED staff the patient has had difficulty with walking in the past 3 days. She has had significant pain in her right foot. She has some chronic shortness of breath.  In the ED she had pain with movement of the foot. She was afebrilel, HR in the 70's and RR in the low twenties for the most part. Oxygen saturations were in the upper  nineties."  Hospital Course:   Summary of her active problems in the hospital is as following.  Sepsis with group C streptococcus with acute kidney injury.  This was present on admission.  Antibiotics were switched over to Unasyn. TEE showing no vegetations.   Repeat blood cultures are negative.   Patient has a PICC line.   CT scan of the abdomen not showing any signs of infection.  MRI of the right ankle negative for osteomyelitis but does show soft tissue swelling. CPK normal range.  Continue antibiotics.   3 weeks of oral Antibiotics and follow up in clinic for further Antibiotics treatment as per ID.  Weekly CBC and CMP  Anemia of chronic disease.  Patient given 1 unit of packed red blood cells during the hospital course. Hemoglobin stable.  Acute metabolic encephalopathy.  Ammonia level only 17. Patient on lactulose.  Mental status likely impaired secondary to sepsis.  Hypotension.  Blood pressure now normal off antihypertensives. Continue low-dose Lasix.  Acute kidney injury on chronic kidney disease stage IIIb. Creatinine peaked at 2.37 and has come down to 1.82.   Watch on low-dose Lasix.  Pressure injury posterior thigh present on admission Bilateral lower extremity wounds and right leg swelling and erythema.  Continue antibiotics.   Podiatry ordered for Unna boots to be changed twice a week  Type 2 diabetes mellitus with chronic kidney disease stage IIIb. Patient on sliding scale insulin  Hyperlipidemia  on Lipitor  Immobility Poor nutritional status and poor intake.  Patient already on Remeron at night and low-dose Marinol.   Dietitian mentioned that we can place a Dobbhoff tube and feed.   At present anorexia is due to infection and my recommendation is to continue medication adjustment and nutritional supplements.no indication for feeding tube.  Obesity morbid Body mass index is 42.28 kg/m.  Nutrition Problem: Increased nutrient needs Etiology: wound  healing Nutrition Interventions: Interventions: Ensure Enlive (each supplement provides 350kcal and 20 grams of protein), MVI  Pressure Injury 05/05/19 Thigh Distal;Left;Posterior Unstageable - Full thickness tissue loss in which the base of the injury is covered by slough (yellow, tan, gray, green or brown) and/or eschar (tan, brown or black) in the wound bed. 4"x4" area with m (Active)  05/05/19 0700  Location: Thigh  Location Orientation: Distal;Left;Posterior  Staging: Unstageable - Full thickness tissue loss in which the base of the injury is covered by slough (yellow, tan, gray, green or brown) and/or eschar (tan, brown or black) in the wound bed.  Wound Description (Comments): 4"x4" area with mixture of pink granulated wound bed and yellow slough.  Present on Admission: Yes    Pain control  [place] was reviewed. 5 day supply was provided. Patient was instructed, not to drive, operate heavy machinery, perform activities at heights, swimming or participation in water activities or provide baby sitting services while on Pain, Sleep and Anxiety Medications; until heroutpatient Physician has advised to do  so again.  Also recommended to not to take more than prescribed Pain, Sleep and Anxiety Medications.  Dry skin Itching  No drug rash or any other rash Eucerin and hydroxyzine   Patient was seen by physical therapy, who recommended SNF, which was arranged. On the day of the discharge the patient's vitals were stable, and no other acute medical condition were reported by patient. the patient was felt safe to be discharge at SNF with therapy.  Consultants: ID, podiatry Procedures: TEE  Discharge Exam: General: Appear in no distress, bilateral leg dry skin; Oral Mucosa Clear, moist. Cardiovascular: S1 and S2 Present, no Murmur, Respiratory: normal respiratory effort, Bilateral Air entry present and no Crackles, no wheezes Abdomen:  Bowel Sound present, Soft and no tenderness, no hernia Extremities: bilateral trace Pedal edema, no calf tenderness Neurology: alert and oriented to time, place, and person affect flat in affect.  Filed Weights   06/04/19 0508 06/05/19 0517 06/06/19 0423  Weight: 116.7 kg 116 kg 115.3 kg   Vitals:   06/06/19 0423 06/06/19 0740  BP: 128/66 122/68  Pulse: 86 85  Resp: 20 18  Temp: 98.3 F (36.8 C) 98.1 F (36.7 C)  SpO2: 96% 97%    DISCHARGE MEDICATION: Allergies as of 06/06/2019      Reactions   Latex Anaphylaxis, Rash   Gabapentin Nausea And Vomiting, Other (See Comments)      Medication List    STOP taking these medications   carvedilol 3.125 MG tablet Commonly known as: COREG   senna-docusate 8.6-50 MG tablet Commonly known as: Senokot-S   spironolactone 25 MG tablet Commonly known as: ALDACTONE   torsemide 20 MG tablet Commonly known as: DEMADEX     TAKE these medications   ammonium lactate 12 % cream Commonly known as: AMLACTIN Apply 1 g topically 2 (two) times daily.   amoxicillin-clavulanate 875-125 MG tablet Commonly known as: AUGMENTIN Take 1 tablet by mouth 2 (two) times daily for 21 days.   atorvastatin 40 MG tablet Commonly known as: LIPITOR Take 40 mg by mouth daily.   collagenase ointment Commonly known as: SANTYL Apply topically daily.   dronabinol 2.5 MG capsule Commonly known as: MARINOL Take 1 capsule (2.5 mg total) by mouth 2 (two) times daily before lunch and supper.   feeding supplement (PRO-STAT SUGAR FREE 64) Liqd Take 30 mLs by mouth 2 (two) times daily.   ferrous sulfate 325 (65 FE) MG tablet Take 1 tablet (325 mg total) by mouth 2 (two) times daily with a meal.   fluticasone 50 MCG/ACT nasal spray Commonly known as: FLONASE Place 2 sprays into both nostrils daily.   folic acid 1 MG tablet Commonly known as: FOLVITE Take 1 tablet (1 mg total) by mouth daily.   furosemide 20 MG tablet Commonly known as: LASIX Take 1  tablet (20 mg total) by mouth daily.   hydrocerin Crea Apply 1 application topically 2 (two) times daily.   HYDROcodone-acetaminophen 5-325 MG tablet Commonly known as: NORCO/VICODIN Take 2 tablets by mouth 3 (three) times daily as needed for up to 5 days for moderate pain or severe pain.   lactulose 10 GM/15ML solution Commonly known as: CHRONULAC Take 30 mLs (20 g total) by mouth 3 (three) times daily.   mirtazapine 30 MG tablet Commonly known as: REMERON Take 30 mg by mouth at bedtime.   multivitamin with minerals Tabs tablet Take 1 tablet by mouth daily.   pregabalin 75 MG capsule Commonly known as: LYRICA Take 1 capsule (  75 mg total) by mouth 2 (two) times daily. What changed:   medication strength  how much to take   RESOURCE 2.0 PO Take 1 Bottle by mouth 2 (two) times daily.            Discharge Care Instructions  (From admission, onward)         Start     Ordered   06/05/19 0000  Discharge wound care:    Comments: Cleanse legs with soap and water and pat dry.  Apply alginate to nonintact wounds to legs and heels.  Wrap with kerlix and secure with coban. Change Monday and Thursday.   06/05/19 1315         Allergies  Allergen Reactions  . Latex Anaphylaxis and Rash  . Gabapentin Nausea And Vomiting and Other (See Comments)   Discharge Instructions    Diet - low sodium heart healthy   Complete by: As directed    Discharge wound care:   Complete by: As directed    Cleanse legs with soap and water and pat dry.  Apply alginate to nonintact wounds to legs and heels.  Wrap with kerlix and secure with coban. Change Monday and Thursday.   Increase activity slowly   Complete by: As directed       The results of significant diagnostics from this hospitalization (including imaging, microbiology, ancillary and laboratory) are listed below for reference.    Significant Diagnostic Studies: CT ABDOMEN PELVIS WO CONTRAST  Result Date: 05/31/2019 CLINICAL  DATA:  Sepsis, CHF and COPD, upper GI bleed with bacteremia EXAM: CT ABDOMEN AND PELVIS WITHOUT CONTRAST TECHNIQUE: Multidetector CT imaging of the abdomen and pelvis was performed following the standard protocol without IV contrast. COMPARISON:  October 27, 2018 FINDINGS: Lower chest: There are moderate bilateral pleural effusions with adjacent passive atelectasis. Underlying consolidation is not excluded. There is cardiomegaly with calcification of mitral annulus. Hypoattenuation of the cardiac blood pool relative to the myocardium is suggestive of anemia. No pericardial effusion. Hepatobiliary: No worrisome focal liver lesions. Smooth liver surface contour. Normal hepatic attenuation. Patient is post cholecystectomy. Slight prominence of the biliary tree likely related to reservoir effect. No visible calcified intraductal gallstones. Pancreas: Unremarkable. No pancreatic ductal dilatation or surrounding inflammatory changes. Spleen: Normal in size without focal abnormality. Adrenals/Urinary Tract: Mild edematous thickening of the left adrenal gland, similar to prior. No suspicious adrenal lesions. Stable appearance of the multiple fluid attenuation cysts throughout both kidneys largest being an exophytic cyst in the left lower pole measuring up to 5.4 cm. Additional stable 14 mm hyperdense cystic structure along the anterior interpolar left kidney unchanged in size but with increased attenuation since 2014 which likely reflects a hemorrhagic or proteinaceous cyst. No concerning new renal masses. Few punctate vascular calcifications versus nonobstructing uroliths in the upper poles both kidneys. No obstructive urolithiasis or hydronephrosis. Moderate bilateral perinephric stranding is similar to comparison study. Urinary bladder is unremarkable. Stomach/Bowel: Normal esophagus, stomach and duodenum. No small bowel thickening or dilatation. High attenuation contrast media passes to the level of the cecum. Appendix  is not visualized. No focal inflammation the vicinity of the cecum to suggest an occult appendicitis. No colonic dilatation or wall thickening. Scattered colonic diverticula without focal pericolonic inflammation to suggest diverticulitis. Vascular/Lymphatic: Atherosclerotic calcifications throughout the abdominal aorta and branch vessels. No aneurysm or ectasia. Multiple borderline enlarged inguinal nodes are similar to prior and likely reactive. Additional prominent retroperitoneal nodes including a 16 mm retrocaval node (2/38) are unchanged from comparison  study as well. Reproductive: Anteverted uterus. No concerning adnexal lesions. Other: Extensive severe body wall edema. Fat containing ventral hernia with portion of the small bowel closely approximating the 3.1 cm fascial defect (2/43). Additional fat containing umbilical hernia as well. No bowel containing hernias. Small volume low-attenuation fluid throughout the abdomen. Musculoskeletal: No acute osseous abnormality or suspicious osseous lesion. Multilevel degenerative changes are present in the imaged portions of the spine. Additional degenerative changes in the hips and pelvis. IMPRESSION: 1. Nonspecific bilateral perinephric stranding is similar to prior and possibly related age or diminished renal function though should correlate with urinalysis to exclude superimposed ascending tract infection. 2. Moderate bilateral pleural effusions with adjacent passive atelectasis. Underlying consolidation is not excluded. 3. Additional features of anasarca with severe body wall edema, edematous changes of the mesentery and small volume ascites. 4. Hypoattenuation of the cardiac blood pool relative to the myocardium is suggestive of anemia. 5. Stable size of a 14 mm hyperdense cystic structure in the left kidney, likely benign proteinaceous or hyperdense cyst. 6. Few punctate vascular calcifications versus nonobstructing uroliths in the upper poles both kidneys. 7.  Fat containing ventral and umbilical hernias. No bowel containing hernia. 8. Aortic Atherosclerosis (ICD10-I70.0). Electronically Signed   By: Lovena Le M.D.   On: 05/31/2019 19:49   DG Tibia/Fibula Right  Result Date: 05/26/2019 CLINICAL DATA:  Pain status post fall EXAM: RIGHT TIBIA AND FIBULA - 2 VIEW COMPARISON:  None. FINDINGS: There is soft tissue swelling about the lower extremity. The patient is status post prior plate screw fixation of the distal fibula. The hardware appears grossly intact with evidence for some loosening of the single transsyndesmotic screw. There may be a small suprapatellar joint effusion. There is no acute displaced fracture or dislocation. IMPRESSION: 1. No acute osseous abnormality. 2. There is nonspecific lower extremity edema. Electronically Signed   By: Constance Holster M.D.   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 CONTRAST:  27m GADAVIST GADOBUTROL 1 MMOL/ML IV SOLN COMPARISON:  None. FINDINGS: Bones/Joint/Cartilage Normal osseous marrow signal is seen throughout. No areas of cortical destruction or periosteal reaction. No areas of abnormal enhancement. No large joint effusions. The articular surfaces appear to be maintained. Ligaments The Lisfranc ligaments are intact. Muscles and Tendons There is diffuse feathery signal seen throughout the muscles surrounding the forefoot. No areas of abnormal enhancement are seen however within the muscles. The flexor and extensor tendons are intact. Soft tissues There is diffuse subcutaneous edema and skin thickening seen surrounding the forefoot. No focal area of ulceration or soft tissue abscess. No sinus tract are  seen. IMPRESSION: No evidence of osteomyelitis or soft tissue abscess. Findings suggestive of diffuse muscular edema and cellulitis. Electronically Signed   By: BPrudencio PairM.D.   On: 06/01/2019 20:12   MR ANKLE RIGHT W WO CONTRAST  Result Date: 06/01/2019 CLINICAL DATA:  Foot swelling diabetic EXAM: MRI OF THE RIGHT ANKLE WITHOUT AND WITH CONTRAST TECHNIQUE: Multiplanar, multisequence MR imaging of the was performed following the administration of intravenous contrast. CONTRAST:  180mGADAVIST GADOBUTROL 1 MMOL/ML IV SOLN COMPARISON:  None. FINDINGS: Bones/Joint/Cartilage There is surrounding metallic artifact from prior fixation at the distal tibia and fibula. Within the  remainder of the osseous structures there is normal osseous marrow signal. No areas of cortical destruction or periosteal reaction. No large ankle or subtalar joint effusion. Ligaments Suboptimally visualized Muscles and Tendons There is diffuse muscular feathery signal seen throughout the ankle. No areas of abnormal enhancement. The visualized portions of the Achilles tendon is intact. There is somewhat limited visualization of the peroneal tendons, however the remainder of the flexor and extensor tendons are intact. Soft tissues Diffuse subcutaneous edema seen surrounding the ankle with skin thickening. No definite sinus tract or loculated fluid collection. IMPRESSION: Prior fixation of the ankle with surrounding metallic artifact. No definite evidence of osteomyelitis or soft tissue abscess. Findings suggestive of diffuse muscular edema and cellulitis. Electronically Signed   By: Prudencio Pair M.D.   On: 06/01/2019 20:36   US Venous Img Lower Bilateral (DVT)  Result Date: 05/26/2019 CLINICAL DATA:  Lower extremity pain and swelling. EXAM: BILATERAL LOWER EXTREMITY VENOUS DOPPLER ULTRASOUND TECHNIQUE: Gray-scale sonography with compression, as well as color and duplex ultrasound, were performed to evaluate the deep venous system(s) from  the level of the common femoral vein through the popliteal and proximal calf veins. COMPARISON:  None. FINDINGS: VENOUS Evaluation of the patient's lower extremities was severely limited by patient habitus and diffuse anasarca. Flow is visualized within all of the identified venous structures, however adequate compressibility could not be assessed. OTHER Extensive bilateral lower extremity edema was noted. There are enlarged inguinal lymph nodes bilaterally. Several of the lymph nodes bilaterally appear to demonstrate diffusely thickened cortices. These were seen on the patient's prior CT of the pelvis from 10/26/2018. Limitations: Patient body habitus IMPRESSION: 1. Severely limited study secondary to patient body habitus and diffuse edema. No occlusive DVT was identified, however a nonocclusive DVT cannot be excluded in either lower extremity. 2. Enlarged bilateral inguinal lymph nodes of unknown clinical significance. These may be reactive, however malignant lymph nodes are not excluded. Electronically Signed   By: Constance Holster M.D.   On: 05/26/2019 21:26   DG Chest Port 1 View  Result Date: 05/26/2019 CLINICAL DATA:  Pain EXAM: PORTABLE CHEST 1 VIEW COMPARISON:  05/04/2019 FINDINGS: There is significant cardiomegaly. The appearance of the cardiac silhouette is unchanged since prior study. Areas of scarring atelectasis are again noted. The patient is status post prior median sternotomy with valve replacement. There is vascular congestion with mild interstitial pulmonary edema. There is no acute osseous abnormality. IMPRESSION: Cardiomegaly with mild interstitial pulmonary edema. Electronically Signed   By: Constance Holster M.D.   On: 05/26/2019 16:26   DG Foot 2 Views Right  Result Date: 05/26/2019 CLINICAL DATA:  Status post fall. Fatigue. EXAM: RIGHT FOOT - 2 VIEW COMPARISON:  February 2011 FINDINGS: There is no evidence of fracture or dislocation. There is no evidence of arthropathy or other  focal bone abnormality. Diffuse soft tissue swelling. Partially visualize fibular fracture fixation. IMPRESSION: 1. No acute fracture or dislocation identified about the right foot. 2. Diffuse soft tissue swelling. Electronically Signed   By: Fidela Salisbury M.D.   On: 05/26/2019 16:27   [place] EKG SITE RITE  Result Date: 06/05/2019 If Site Rite image not attached, placement could not be confirmed due to current cardiac rhythm.  [place] EKG SITE RITE  Result Date: 06/04/2019 If Site Rite image not attached, placement could not be confirmed due to current cardiac rhythm.  [place] EKG SITE RITE  Result Date: 05/30/2019 If Site Rite image not attached, placement could not be confirmed due to  current cardiac rhythm.   Microbiology: Recent Results (from the past 240 hour(s))  Culture, blood (x 2)     Status: None   Collection Time: 05/27/19  6:54 PM   Specimen: BLOOD  Result Value Ref Range Status   Specimen Description BLOOD BLOOD RIGHT HAND  Final   Special Requests   Final    BOTTLES DRAWN AEROBIC AND ANAEROBIC Blood Culture adequate volume   Culture   Final    NO GROWTH 5 DAYS Performed at [facility], [address] [place], [place] [address]    Report Status 06/01/2019 FINAL  Final  Culture, blood (x 2)     Status: None   Collection Time: 05/27/19  6:56 PM   Specimen: BLOOD  Result Value Ref Range Status   Specimen Description BLOOD BLOOD LEFT HAND  Final   Special Requests   Final    BOTTLES DRAWN AEROBIC AND ANAEROBIC Blood Culture results may not be optimal due to an inadequate volume of blood received in culture bottles   Culture   Final    NO GROWTH 5 DAYS Performed at [facility], [address] [place], [place] [address]    Report Status 06/01/2019 FINAL  Final  SARS Coronavirus 2 by RT PCR (hospital order, performed in [facility] hospital lab) Nasopharyngeal Nasopharyngeal Swab     Status: None   Collection Time: 06/05/19 12:15 PM   Specimen:  Nasopharyngeal Swab  Result Value Ref Range Status   SARS Coronavirus 2 NEGATIVE NEGATIVE Final    Comment: (NOTE) SARS-CoV-2 target nucleic acids are NOT DETECTED. The SARS-CoV-2 RNA is generally detectable in upper and lower respiratory specimens during the acute phase of infection. The lowest concentration of SARS-CoV-2 viral copies this assay can detect is 250 copies / mL. A negative result does not preclude SARS-CoV-2 infection and should not be used as the sole basis for treatment or other patient management decisions.  A negative result may occur with improper specimen collection / handling, submission of specimen other than nasopharyngeal swab, presence of viral mutation(s) within the areas targeted by this assay, and inadequate number of viral copies (<250 copies / mL). A negative result must be combined with clinical observations, patient history, and epidemiological information. Fact Sheet for Patients:   StrictlyIdeas.no Fact Sheet for Healthcare Providers: BankingDealers.co.za This test is not yet approved or cleared  by the [place] FDA and has been authorized for detection and/or diagnosis of SARS-CoV-2 by FDA under an Emergency Use Authorization (EUA).  This EUA will remain in effect (meaning this test can be used) for the duration of the COVID-19 declaration under Section 564(b)(1) of the Act, 21 U.S.C. section 360bbb-3(b)(1), unless the authorization is terminated or revoked sooner. Performed at [facility], [address] [place], [place] [address]      Labs: CBC: Recent Labs  Lab 06/01/19 0557 06/02/19 0746 06/03/19 0411 06/04/19 0818 06/05/19 0606  WBC 24.2* 25.2* 20.3* 20.3* 16.2*  NEUTROABS  --   --  18.4*  --   --   HGB 8.3* 7.8* 8.1* 7.8* 8.1*  HCT 28.1* 26.2* 27.2* 27.4* 28.3*  MCV 75.5* 75.1* 74.9* 78.5* 77.5*  PLT 198 186 185 192 220   Basic Metabolic Panel: Recent Labs  Lab  06/01/19 0557 06/02/19 0746 06/03/19 0411 06/04/19 0818 06/05/19 0606  NA 136 139 140 140 142  K 4.5 4.7 4.6 4.8 4.7  CL 104 106 105 106 108  CO2 24 23 24 26  24  GLUCOSE 159* 168* 159* 182* 127*  BUN 44* 40* 35* 33* 33*  CREATININE 1.95* 1.83* 1.84* 1.82* 1.77*  CALCIUM 8.0* 8.1* 8.3* 8.1* 8.1*   Liver Function Tests: No results for input(s): AST, ALT, ALKPHOS, BILITOT, PROT, ALBUMIN in the last 168 hours. No results for input(s): LIPASE, AMYLASE in the last 168 hours. No results for input(s): AMMONIA in the last 168 hours. Cardiac Enzymes: Recent Labs  Lab 06/02/19 0746  CKTOTAL 49   BNP (last 3 results) Recent Labs    08/17/18 1030 05/04/19 1133 05/26/19 1457  BNP 861.0* 778.0* 827.0*   CBG: Recent Labs  Lab 06/05/19 0728 06/05/19 1057 06/05/19 1628 06/05/19 2022 06/06/19 0743  GLUCAP 120* 181* 178* 178* 119*    Time spent: 35 minutes  Signed:  Berle Mull  [facility]  06/06/2019 10:59 AM

## 2019-06-07 ENCOUNTER — Telehealth: Payer: Self-pay | Admitting: Family

## 2019-06-07 NOTE — Telephone Encounter (Signed)
Unable to reach patient at [facility] regarding her follow up [facility] appointment scheduled with [place] for 5/27. Since patient has relocated to [place] from [place], we are not sure if patient plans to continue care here.   Alyse Low, [place]

## 2019-06-11 ENCOUNTER — Telehealth: Payer: Medicaid Other | Admitting: Infectious Diseases

## 2019-06-12 NOTE — Progress Notes (Signed)
Patient ID: [name], female    DOB: Aug 03, 1955, 64 y.o.   MRN: [id number]  HPI  [name] is a 64 y/o female with a history of COPD, diabetes, NSTEMI, renal insufficiency, tobacco use and chronic heart failure.   Echo report from 5/13/321 reviewed and showed an EF of 60-65% along with trivial MR. Echo report from 05/06/19 reviewed and showed an EF of 50-55% along with moderate LVH, moderate MR and moderately elevated PA pressure. Echo report from 12/19/16 reviewed and showed an EF of 40-45% along with moderate AS, mild Tanya and moderate MR. EF has declined from previous reading in October 2017.   Cardiac catheterization done 12/23/16 showed 3 vessel CAD with considerate of CABG when pulmonary and renal status is optimized. Prox RCA to mid RCA has 100% stenosis, Prox CX to mid CX has 80% stenosis and Ost LAD to prox LAD has 90% stenosis.   Admitted 05/26/19 due to Sepsis with group C streptococcus with acute kidney injury. Wound care, podiatry, cardiology, ID and palliative care consults were obtained. TEE was negative for vegetation. Antibiotics were started and PICC line placed. Abdominal CT was negative for infection. Right ankle MRI negative for osteomyelitis. 1 unit of PRBC's given. UNNA boots ordered. PT evaluation done. Discharged after 11 days to SNF. Admitted 05/04/19 due to acute on chronic heart failure. Wound care consult obtained. Initially given IV lasix and then transitioned to oral diuretics. Negative for DVT. Given 2 units of PRBC's due to anemia. Has cirrhosis but no ascites. Discharged after 6 days.   She presents today for a follow-up visit although hasn't been seen since 2018. She presents with a chief complaint of mild fatigue upon moderate exertion. She describes this as chronic in nature and has been present for many years. She has associated pedal edema, weakness and chronic back pain along with this. She denies any difficulty sleeping, dizziness, abdominal distention,  palpitations, chest pain, shortness of breath or cough.   Currently has her right lower leg in a boot.   Past Medical History:  Diagnosis Date  . Cervical cancer ([place])   . CHF (congestive heart failure) ([place])   . Chronic anemia   . COPD (chronic obstructive pulmonary disease) ([place])   . Diabetes mellitus without complication ([place])   . Gastric ulcer   . NSTEMI (non-ST elevated myocardial infarction) ([place])   . Upper GI bleed    Past Surgical History:  Procedure Laterality Date  . AMPUTATION Left 06/24/2018   Procedure: AMPUTATION GREAT TOE;  Surgeon: Samara Deist, DPM;  Location: [facility];  Service: Podiatry;  Laterality: Left;  . APPENDECTOMY    . BIOPSY  11/22/2018   Procedure: BIOPSY;  Surgeon: Thornton Park, MD;  Location: [facility];  Service: Gastroenterology;;  . CARDIAC CATHETERIZATION    . CHOLECYSTECTOMY    . COLONOSCOPY N/A 09/28/2017   Procedure: COLONOSCOPY;  Surgeon: Toledo, Benay Pike, MD;  Location: [facility]OSCOPY;  Service: Gastroenterology;  Laterality: N/A;  . COLONOSCOPY WITH PROPOFOL N/A 08/21/2018   Procedure: COLONOSCOPY WITH PROPOFOL;  Surgeon: Lin Landsman, MD;  Location: [facility] ENDOSCOPY;  Service: Gastroenterology;  Laterality: N/A;  . COLONOSCOPY WITH PROPOFOL N/A 12/28/2018   Procedure: COLONOSCOPY WITH PROPOFOL;  Surgeon: Rush Landmark Telford Nab., MD;  Location: [facility]COPY;  Service: Gastroenterology;  Laterality: N/A;  . ENTEROSCOPY Left 11/06/2017   Procedure: ENTEROSCOPY;  Surgeon: Jonathon Bellows, MD;  Location: [facility] ENDOSCOPY;  Service: Gastroenterology;  Laterality: Left;  . ENTEROSCOPY N/A 06/22/2018   Procedure:  ENTEROSCOPY;  Surgeon: Lin Landsman, MD;  Location: [facility] ENDOSCOPY;  Service: Gastroenterology;  Laterality: N/A;  . ENTEROSCOPY N/A 08/18/2018   Procedure: ENTEROSCOPY;  Surgeon: Jonathon Bellows, MD;  Location: [facility];  Service: Gastroenterology;  Laterality: N/A;  . ENTEROSCOPY N/A 11/01/2018   Procedure: ENTEROSCOPY;   Surgeon: Virgel Manifold, MD;  Location: [facility] ENDOSCOPY;  Service: Endoscopy;  Laterality: N/A;  . ENTEROSCOPY N/A 11/22/2018   Procedure: ENTEROSCOPY;  Surgeon: Thornton Park, MD;  Location: [place];  Service: Gastroenterology;  Laterality: N/A;  . ENTEROSCOPY Left 12/17/2018   Procedure: ENTEROSCOPY;  Surgeon: Yetta Flock, MD;  Location: [facility] ENDOSCOPY;  Service: Gastroenterology;  Laterality: Left;  . ENTEROSCOPY N/A 12/19/2018   Procedure: ENTEROSCOPY;  Surgeon: Yetta Flock, MD;  Location: [facility]COPY;  Service: Gastroenterology;  Laterality: N/A;  . ENTEROSCOPY N/A 12/20/2018   Procedure: ENTEROSCOPY;  Surgeon: Lavena Bullion, DO;  Location: [facility]OS[facility];  Service: Gastroenterology;  Laterality: N/A;  . ESOPHAGOGASTRODUODENOSCOPY N/A 12/19/2016   Procedure: ESOPHAGOGASTRODUODENOSCOPY (EGD);  Surgeon: Lin Landsman, MD;  Location: [facility] ENDOSCOPY;  Service: Gastroenterology;  Laterality: N/A;  . ESOPHAGOGASTRODUODENOSCOPY N/A 09/28/2017   Procedure: ESOPHAGOGASTRODUODENOSCOPY (EGD);  Surgeon: Toledo, Benay Pike, MD;  Location: [facility] ENDOSCOPY;  Service: Gastroenterology;  Laterality: N/A;  . ESOPHAGOGASTRODUODENOSCOPY (EGD) WITH PROPOFOL N/A 08/08/2017   Procedure: ESOPHAGOGASTRODUODENOSCOPY (EGD) WITH PROPOFOL;  Surgeon: [name], MD;  Location: [facility] ENDOSCOPY;  Service: Endoscopy;  Laterality: N/A;  . GIVENS CAPSULE STUDY N/A 10/28/2018   Procedure: GIVENS CAPSULE STUDY;  Surgeon: Jonathon Bellows, MD;  Location: [facility] ENDOSCOPY;  Service: Gastroenterology;  Laterality: N/A;  . GIVENS CAPSULE STUDY N/A 12/26/2018   Procedure: GIVENS CAPSULE STUDY;  Surgeon: Rush Landmark Telford Nab., MD;  Location: [facility];  Service: Gastroenterology;  Laterality: N/A;  . [place]    . HEMOSTASIS CLIP PLACEMENT  12/19/2018   Procedure: HEMOSTASIS CLIP PLACEMENT;  Surgeon: Yetta Flock, MD;  Location: [facility];  Service: Gastroenterology;;  .  HEMOSTASIS CLIP PLACEMENT  12/28/2018   Procedure: HEMOSTASIS CLIP PLACEMENT;  Surgeon: Irving Copas., MD;  Location: [facility];  Service: Gastroenterology;;  . HOT HEMOSTASIS N/A 12/17/2018   Procedure: HOT HEMOSTASIS (ARGON PLASMA COAGULATION/BICAP);  Surgeon: Yetta Flock, MD;  Location: [facility];  Service: Gastroenterology;  Laterality: N/A;  . HOT HEMOSTASIS N/A 12/28/2018   Procedure: HOT HEMOSTASIS (ARGON PLASMA COAGULATION/BICAP);  Surgeon: Irving Copas., MD;  Location: [facility];  Service: Gastroenterology;  Laterality: N/A;  . LEFT HEART CATH AND CORONARY ANGIOGRAPHY N/A 12/23/2016   Procedure: LEFT HEART CATH AND CORONARY ANGIOGRAPHY;  Surgeon: Teodoro Spray, MD;  Location: [place] CV LAB;  Service: Cardiovascular;  Laterality: N/A;  . LOWER EXTREMITY ANGIOGRAPHY Left 06/26/2018   Procedure: Lower Extremity Angiography;  Surgeon: Katha Cabal, MD;  Location: [place] CV LAB;  Service: Cardiovascular;  Laterality: Left;  . SUBMUCOSAL INJECTION  12/19/2018   Procedure: SUBMUCOSAL INJECTION;  Surgeon: Yetta Flock, MD;  Location: [facility];  Service: Gastroenterology;;  . Lia Foyer TATTOO INJECTION  12/17/2018   Procedure: SUBMUCOSAL TATTOO INJECTION;  Surgeon: Yetta Flock, MD;  Location: [facility];  Service: Gastroenterology;;  . Lia Foyer TATTOO INJECTION  12/28/2018   Procedure: SUBMUCOSAL TATTOO INJECTION;  Surgeon: Irving Copas., MD;  Location: [facility];  Service: Gastroenterology;;  . TEE WITHOUT CARDIOVERSION N/A 10/29/2018   Procedure: TRANSESOPHAGEAL ECHOCARDIOGRAM (TEE);  Surgeon: Teodoro Spray, MD;  Location: [facility];  Service: Cardiovascular;  Laterality: N/A;  .  TEE WITHOUT CARDIOVERSION N/A 05/30/2019   Procedure: TRANSESOPHAGEAL ECHOCARDIOGRAM (TEE);  Surgeon: Teodoro Spray, MD;  Location: [facility];  Service: Cardiovascular;  Laterality: N/A;   Family History  Problem Relation  Age of Onset  . Hypertension Mother   . Diabetes Mellitus II Mother   . Breast cancer Mother   . Heart disease Father   . Cancer Sister    Social History   Tobacco Use  . Smoking status: Current Some Day Smoker    Packs/day: 0.25    Types: Cigarettes  . Smokeless tobacco: Never Used  Substance Use Topics  . Alcohol use: No   Allergies  Allergen Reactions  . Latex Anaphylaxis and Rash  . Gabapentin Nausea And Vomiting and Other (See Comments)   Prior to Admission medications   Medication Sig Start Date End Date Taking? Authorizing Provider  Amino Acids-Protein Hydrolys (FEEDING SUPPLEMENT, PRO-STAT SUGAR FREE 64,) LIQD Take 30 mLs by mouth 2 (two) times daily. 12/07/18  Yes Al Decant, MD  ammonium lactate (AMLACTIN) 12 % cream Apply 1 g topically 2 (two) times daily.   Yes [provider]  amoxicillin-clavulanate (AUGMENTIN) 875-125 MG tablet Take 1 tablet by mouth 2 (two) times daily for 21 days. 06/05/19 06/26/19 Yes Lavina Hamman, MD  atorvastatin (LIPITOR) 40 MG tablet Take 40 mg by mouth daily.   Yes [provider]  collagenase (SANTYL) ointment Apply topically daily. 05/10/19  Yes Fritzi Mandes, MD  dronabinol (MARINOL) 2.5 MG capsule Take 1 capsule (2.5 mg total) by mouth 2 (two) times daily before lunch and supper. 06/05/19  Yes Lavina Hamman, MD  ferrous sulfate 325 (65 FE) MG tablet Take 1 tablet (325 mg total) by mouth 2 (two) times daily with a meal. 05/10/19  Yes Fritzi Mandes, MD  fluticasone (FLONASE) 50 MCG/ACT nasal spray Place 2 sprays into both nostrils daily.   Yes [provider]  folic acid (FOLVITE) 1 MG tablet Take 1 tablet (1 mg total) by mouth daily. 08/21/18  Yes Ojie, Jude, MD  furosemide (LASIX) 20 MG tablet Take 1 tablet (20 mg total) by mouth daily. 06/06/19  Yes Lavina Hamman, MD  hydrocerin (EUCERIN) CREA Apply 1 application topically 2 (two) times daily. 06/05/19  Yes Lavina Hamman, MD  lactulose (CHRONULAC) 10 GM/15ML  solution Take 30 mLs (20 g total) by mouth 3 (three) times daily. 12/07/18  Yes Al Decant, MD  mirtazapine (REMERON) 30 MG tablet Take 30 mg by mouth at bedtime. 03/29/19  Yes [provider]  Multiple Vitamin (MULTIVITAMIN WITH MINERALS) TABS tablet Take 1 tablet by mouth daily. 06/28/18  Yes Lang Snow, NP  pregabalin (LYRICA) 75 MG capsule Take 1 capsule (75 mg total) by mouth 2 (two) times daily. 06/05/19  Yes Lavina Hamman, MD  Nutritional Supplements (RESOURCE 2.0 PO) Take 1 Bottle by mouth 2 (two) times daily.    [provider]    Review of Systems  Constitutional: Positive for fatigue. Negative for appetite change.  HENT: Negative for congestion, postnasal drip and sore throat.   Eyes: Negative.   Respiratory: Negative for cough, chest tightness and shortness of breath.   Cardiovascular: Positive for leg swelling. Negative for chest pain and palpitations.  Gastrointestinal: Negative for abdominal distention and abdominal pain.  Endocrine: Negative.   Genitourinary: Negative.   Musculoskeletal: Positive for back pain (chronic back pain). Negative for neck pain.  Skin: Negative.   Allergic/Immunologic: Negative.   Neurological: Positive for weakness. Negative  for dizziness and light-headedness.  Hematological: Negative for adenopathy. Does not bruise/bleed easily.  Psychiatric/Behavioral: Negative for dysphoric mood and sleep disturbance. The patient is not nervous/anxious.    Vitals:   06/13/19 1030  BP: 122/66  Pulse: 88  Resp: 16  SpO2: 100%  Weight: 254 lb (115.2 kg)  Height: 5' 5"  (1.651 m)   Wt Readings from Last 3 Encounters:  06/13/19 254 lb (115.2 kg)  06/06/19 254 lb 1.6 oz (115.3 kg)  05/10/19 243 lb 12.8 oz (110.6 kg)   Lab Results  Component Value Date   CREATININE 1.77 (H) 06/05/2019   CREATININE 1.82 (H) 06/04/2019   CREATININE 1.84 (H) 06/03/2019    Physical Exam  Constitutional: She is oriented to person, place,  and time. She appears well-developed and well-nourished.  HENT:  Head: Normocephalic and atraumatic.  Neck: No JVD present.  Cardiovascular: Normal rate and regular rhythm.  Pulmonary/Chest: Effort normal. She has no wheezes. She has no rales.  Abdominal: Soft. She exhibits no distension. There is no abdominal tenderness.  Musculoskeletal:        General: Edema (1+ pitting edema bilateral lower legs) present.     Cervical back: Normal range of motion and neck supple.  Neurological: She is alert and oriented to person, place, and time.  Skin: Skin is warm and dry.  Psychiatric: She has a normal mood and affect. Her behavior is normal. Thought content normal.  Nursing note and vitals reviewed.  Assessment & Plan:  1: Chronic heart failure with preserved ejection fraction without structural changes- - NYHA class II - euvolemic today - not being weighed daily at the facility; order written for her to be weighed daily and to call for an overnight weight gain of >2 pounds or a weekly weight gain of >5 pounds - adds "some" salt to foods; encouraged her to not add any salt to her food - she says that she elevates her legs when sitting for long periods of time; right lower leg currently has a boot on it - BNP 05/26/19 was 827.0 - PharmD reconciled facility medications  2: Diabetes- - currently seeing PCP at facility - BMP from 06/05/19 reviewed and showed sodium 142, potassium 4.7, creatinine 1.77 and GFR 35 - A1c from 05/27/19 reviewed and was 7.4%  3: Tobacco- - currently smoking 3 cigarettes daily - complete cessation discussed for 3 minutes with her.   Facility medication list reviewed.   Patient opts to not make a return appointment at this time. Advised her that she could return at anytime to schedule another appointment.

## 2019-06-13 ENCOUNTER — Other Ambulatory Visit: Payer: Self-pay

## 2019-06-13 ENCOUNTER — Encounter: Payer: Self-pay | Admitting: Family

## 2019-06-13 ENCOUNTER — Ambulatory Visit: Payer: Medicaid Other | Attending: Family | Admitting: Family

## 2019-06-13 VITALS — BP 122/66 | HR 88 | Resp 16 | Ht 65.0 in | Wt 254.0 lb

## 2019-06-13 DIAGNOSIS — E119 Type 2 diabetes mellitus without complications: Secondary | ICD-10-CM | POA: Diagnosis not present

## 2019-06-13 DIAGNOSIS — J449 Chronic obstructive pulmonary disease, unspecified: Secondary | ICD-10-CM | POA: Insufficient documentation

## 2019-06-13 DIAGNOSIS — F17210 Nicotine dependence, cigarettes, uncomplicated: Secondary | ICD-10-CM | POA: Diagnosis not present

## 2019-06-13 DIAGNOSIS — Z888 Allergy status to other drugs, medicaments and biological substances status: Secondary | ICD-10-CM | POA: Insufficient documentation

## 2019-06-13 DIAGNOSIS — Z8541 Personal history of malignant neoplasm of cervix uteri: Secondary | ICD-10-CM | POA: Diagnosis not present

## 2019-06-13 DIAGNOSIS — Z8249 Family history of ischemic heart disease and other diseases of the circulatory system: Secondary | ICD-10-CM | POA: Diagnosis not present

## 2019-06-13 DIAGNOSIS — I252 Old myocardial infarction: Secondary | ICD-10-CM | POA: Diagnosis not present

## 2019-06-13 DIAGNOSIS — Z91040 Latex allergy status: Secondary | ICD-10-CM | POA: Diagnosis not present

## 2019-06-13 DIAGNOSIS — Z79899 Other long term (current) drug therapy: Secondary | ICD-10-CM | POA: Insufficient documentation

## 2019-06-13 DIAGNOSIS — Z803 Family history of malignant neoplasm of breast: Secondary | ICD-10-CM | POA: Diagnosis not present

## 2019-06-13 DIAGNOSIS — I5032 Chronic diastolic (congestive) heart failure: Secondary | ICD-10-CM

## 2019-06-13 DIAGNOSIS — N289 Disorder of kidney and ureter, unspecified: Secondary | ICD-10-CM | POA: Insufficient documentation

## 2019-06-13 DIAGNOSIS — Z833 Family history of diabetes mellitus: Secondary | ICD-10-CM | POA: Insufficient documentation

## 2019-06-13 DIAGNOSIS — Z720 Tobacco use: Secondary | ICD-10-CM

## 2019-06-13 DIAGNOSIS — N1832 Chronic kidney disease, stage 3b: Secondary | ICD-10-CM

## 2019-06-13 DIAGNOSIS — I509 Heart failure, unspecified: Secondary | ICD-10-CM | POA: Diagnosis present

## 2019-06-13 DIAGNOSIS — D649 Anemia, unspecified: Secondary | ICD-10-CM | POA: Insufficient documentation

## 2019-06-13 NOTE — Progress Notes (Signed)
[facility] - PHARMACIST COUNSELING NOTE  ADHERENCE ASSESSMENT  Adherence strategy: Patient is currently residing at [facility]. Facility administers medications. Medication orders present but no MAR.    Do you ever forget to take your medication? [] Yes (1) [x] No (0)  Do you ever skip doses due to side effects? [] Yes (1) [x] No (0)  Do you have trouble affording your medicines? [] Yes (1) [x] No (0)  Are you ever unable to pick up your medication due to transportation difficulties? [] Yes (1) [x] No (0)  Do you ever stop taking your medications because you don't believe they are helping? [] Yes (1) [x] No (0)  Total score 0   Recommendations given to patient about increasing adherence: None needed. Will need to re-assess if / when patient returns to community living.  Guideline-Directed Medical Therapy/Evidence Based Medicine  ACE/ARB/ARNI: None Beta Blocker: None Aldosterone Antagonist: None Diuretic: Furosemide 20 mg daily    SUBJECTIVE  HPI: Patient is a 64 y/o F with PMH as below who presents to CHF clinic for follow-up. Patient was recently hospitalized with Group C Streptococcus bactermia from source cellulitis of ankle. Discharged on Augmentin x 3 weeks. She was further hospitalized 4/17 - 4/23 with symptomatic anemia and fluid overload secondary to CHF exacerbation.   Past Medical History:  Diagnosis Date  . Cervical cancer ([place])   . CHF (congestive heart failure) ([place])   . Chronic anemia   . COPD (chronic obstructive pulmonary disease) ([place])   . Diabetes mellitus without complication ([place])   . Gastric ulcer   . NSTEMI (non-ST elevated myocardial infarction) ([place])   . Upper GI bleed      OBJECTIVE   Vital signs: HR 88, BP 122/66, weight (pounds) 254 ECHO: Date 05/06/19, EF 50-55%, notes: Moderate LVH, grade II diastolic dysfunction. Moderately elevated PA systolic pressure. LA moderately dilated. RA mildly dilated. Moderate  MVR, moderate TVR.  BMP Latest Ref Rng & Units 06/05/2019 06/04/2019 06/03/2019  Glucose 70 - 99 mg/dL 127(H) 182(H) 159(H)  BUN 8 - 23 mg/dL 33(H) 33(H) 35(H)  Creatinine 0.44 - 1.00 mg/dL 1.77(H) 1.82(H) 1.84(H)  Sodium 135 - 145 mmol/L 142 140 140  Potassium 3.5 - 5.1 mmol/L 4.7 4.8 4.6  Chloride 98 - 111 mmol/L 108 106 105  CO2 22 - 32 mmol/L 24 26 24   Calcium 8.9 - 10.3 mg/dL 8.1(L) 8.1(L) 8.3(L)    ASSESSMENT  Patient is ill-appearing but in no apparent acute distress. She has been residing at a skilled nursing facility since hospital discharge. Facility responsible for administering medications. Patient endorses she feels dizzy / lightheaded after taking medications. She reports poor oral intake. She is unable to stand for weight today.   PLAN  1). Group C Streptococcus bacteremia -Upcoming appointment with infectious diseases -Augmentin 875/125 mg BID -Denies fevers / chills / diarrhea  2). CHF -Fluid management with furosemide 20 mg daily -Discussed with provider -   3). COPD -Not on any bronchodilators  4). T2DM -Last HgbA1c was 7.4% on 05/27/19 from 6.8% on 05/08/19 -Lifestyle management only  5). Anemia -Hgb 8.1 , MCV 77.5 on 06/05/19 -Anemia panel 05/04/19 with saturation ratio of 4%, iron 11, folate 28, vitamin B12 604 -Ferrous sulfate 244 mg BID, folic acid 1 mg daily  6). Hyperlipidemia -Atorvastatin 40 mg daily -Lipid panel 02/06/18 with TG 53, LDL 80, HDL 32  7). Poor appetite -Dronabinol 2.5 mg BID, mirtazapine 30 mg daily -Protein supplements  8). Hyperammonemia -Lactulose 20 g TID  Time spent: 15 minutes  [place] Resident 06/13/2019 10:20 AM    Current Outpatient Medications:  .  Amino Acids-Protein Hydrolys (FEEDING SUPPLEMENT, PRO-STAT SUGAR FREE 64,) LIQD, Take 30 mLs by mouth 2 (two) times daily., Disp: 887 mL, Rfl: 0 .  ammonium lactate (AMLACTIN) 12 % cream, Apply 1 g topically 2 (two) times daily., Disp: , Rfl:  .   amoxicillin-clavulanate (AUGMENTIN) 875-125 MG tablet, Take 1 tablet by mouth 2 (two) times daily for 21 days., Disp: 42 tablet, Rfl: 0 .  atorvastatin (LIPITOR) 40 MG tablet, Take 40 mg by mouth daily., Disp: , Rfl:  .  collagenase (SANTYL) ointment, Apply topically daily., Disp: 15 g, Rfl: 0 .  dronabinol (MARINOL) 2.5 MG capsule, Take 1 capsule (2.5 mg total) by mouth 2 (two) times daily before lunch and supper., Disp: 30 capsule, Rfl: 0 .  ferrous sulfate 325 (65 FE) MG tablet, Take 1 tablet (325 mg total) by mouth 2 (two) times daily with a meal., Disp: 60 tablet, Rfl: 3 .  fluticasone (FLONASE) 50 MCG/ACT nasal spray, Place 2 sprays into both nostrils daily., Disp: , Rfl:  .  folic acid (FOLVITE) 1 MG tablet, Take 1 tablet (1 mg total) by mouth daily., Disp: 30 tablet, Rfl: 0 .  furosemide (LASIX) 20 MG tablet, Take 1 tablet (20 mg total) by mouth daily., Disp: 30 tablet, Rfl: 0 .  hydrocerin (EUCERIN) CREA, Apply 1 application topically 2 (two) times daily., Disp: , Rfl: 0 .  lactulose (CHRONULAC) 10 GM/15ML solution, Take 30 mLs (20 g total) by mouth 3 (three) times daily., Disp: 236 mL, Rfl: 0 .  mirtazapine (REMERON) 30 MG tablet, Take 30 mg by mouth at bedtime., Disp: , Rfl:  .  Multiple Vitamin (MULTIVITAMIN WITH MINERALS) TABS tablet, Take 1 tablet by mouth daily., Disp: 30 tablet, Rfl: 0 .  Nutritional Supplements (RESOURCE 2.0 PO), Take 1 Bottle by mouth 2 (two) times daily., Disp: , Rfl:  .  pregabalin (LYRICA) 75 MG capsule, Take 1 capsule (75 mg total) by mouth 2 (two) times daily., Disp: 30 capsule, Rfl: 0   COUNSELING POINTS/CLINICAL PEARLS  Furosemide  Drug causes sun-sensitivity. Advise patient to use sunscreen and avoid tanning beds. Patient should avoid activities requiring coordination until drug effects are realized, as drug may cause dizziness, vertigo, or blurred vision. This drug may cause hyperglycemia, hyperuricemia, constipation, diarrhea, loss of appetite, nausea,  vomiting, purpuric disorder, cramps, spasticity, asthenia, headache, paresthesia, or scaling eczema. Instruct patient to report unusual bleeding/bruising or signs/symptoms of hypotension, infection, pancreatitis, or ototoxicity (tinnitus, hearing impairment). Advise patient to report signs/symptoms of a severe skin reactions (flu-like symptoms, spreading red rash, or skin/mucous membrane blistering) or erythema multiforme. Instruct patient to eat high-potassium foods during drug therapy, as directed by healthcare professional.  Patient should not drink alcohol while taking this drug.  DRUGS TO AVOID IN HEART FAILURE  Drug or Class Mechanism  Analgesics . NSAIDs . COX-2 inhibitors . Glucocorticoids  Sodium and water retention, increased systemic vascular resistance, decreased response to diuretics   Diabetes Medications . Metformin . Thiazolidinediones o Rosiglitazone (Avandia) o Pioglitazone (Actos) . DPP4 Inhibitors o Saxagliptin (Onglyza) o Sitagliptin (Januvia)   Lactic acidosis Possible calcium channel blockade   Unknown  Antiarrhythmics . Class I  o Flecainide o Disopyramide . Class III o Sotalol . Other o Dronedarone  Negative inotrope, proarrhythmic   Proarrhythmic, beta blockade  Negative inotrope  Antihypertensives . Alpha Blockers o Doxazosin . Calcium Channel Blockers  o Diltiazem o Verapamil o Nifedipine . Central Alpha Adrenergics o Moxonidine . Peripheral Vasodilators o Minoxidil  Increases renin and aldosterone  Negative inotrope    Possible sympathetic withdrawal  Unknown  Anti-infective . Itraconazole . Amphotericin B  Negative inotrope Unknown  Hematologic . Anagrelide . Cilostazol   Possible inhibition of PD IV Inhibition of PD III causing arrhythmias  Neurologic/Psychiatric . Stimulants . Anti-Seizure  Drugs o Carbamazepine o Pregabalin . Antidepressants o Tricyclics o Citalopram . Parkinsons o Bromocriptine o Pergolide o Pramipexole . Antipsychotics o Clozapine . Antimigraine o Ergotamine o Methysergide . Appetite suppressants . Bipolar o Lithium  Peripheral alpha and beta agonist activity  Negative inotrope and chronotrope Calcium channel blockade  Negative inotrope, proarrhythmic Dose-dependent QT prolongation  Excessive serotonin activity/valvular damage Excessive serotonin activity/valvular damage Unknown  IgE mediated hypersensitivy, calcium channel blockade  Excessive serotonin activity/valvular damage Excessive serotonin activity/valvular damage Valvular damage  Direct myofibrillar degeneration, adrenergic stimulation  Antimalarials . Chloroquine . Hydroxychloroquine Intracellular inhibition of lysosomal enzymes  Urologic Agents . Alpha Blockers o Doxazosin o Prazosin o Tamsulosin o Terazosin  Increased renin and aldosterone  Adapted from Page RL, et al. "Drugs That May Cause or Exacerbate Heart Failure: A Scientific Statement from the [place]." Circulation 2016; 219:X58-I32. DOI: 10.1161/CIR.0000000000000426   MEDICATION ADHERENCES TIPS AND STRATEGIES 1. Taking medication as prescribed improves patient outcomes in heart failure (reduces hospitalizations, improves symptoms, increases survival) 2. Side effects of medications can be managed by decreasing doses, switching agents, stopping drugs, or adding additional therapy. Please let someone in the [facility] know if you have having bothersome side effects so we can modify your regimen. Do not alter your medication regimen without talking to [place].  3. Medication reminders can help patients remember to take drugs on time. If you are missing or forgetting doses you can try linking behaviors, using pill boxes, or an electronic reminder like an alarm on your phone or an app. Some  people can also get automated phone calls as medication reminders.

## 2019-06-13 NOTE — Patient Instructions (Addendum)
Continue weighing daily and call for an overnight weight gain of > 2 pounds or a weekly weight gain of >5 pounds.   Call us in the future if you'd like to schedule another appointment

## 2019-06-14 ENCOUNTER — Non-Acute Institutional Stay: Payer: Medicaid Other | Admitting: Hospice

## 2019-06-14 ENCOUNTER — Other Ambulatory Visit: Payer: Self-pay

## 2019-06-14 DIAGNOSIS — Z515 Encounter for palliative care: Secondary | ICD-10-CM

## 2019-06-14 NOTE — Progress Notes (Signed)
[facility] Consult Note Telephone: [phone]  Fax: [phone]  PATIENT NAME: [name] DOB: Apr 25, 1955 MRN: [id number]  PRIMARY CARE PROVIDER:   [facility]  REFERRING PROVIDER: Dr. Seward Carol RESPONSIBLE PARTY:  Self Contact: Spouse- [name] [phone]    RECOMMENDATIONS/PLAN:   Advance Care Planning/Goals of Care: Visit at the request of Dr Seward Carol for palliative consult. Visit consisted of building trust and discussions on Palliative Medicine as specialized medical care for people living with serious illness, aimed at facilitating better quality of life through symptoms relief, assisting with advance care plan and establishing goals of care. Patient endorsed palliative care, and affirmed she is a full code. Goals of care include to maximize quality of life and symptom management. Visit consisted of counseling and education dealing with the complex and emotionally intense issues of symptom management and palliative care in the setting of serious and potentially life-threatening illness. Palliative care team will continue to support patient, patient's family, and medical team. Symptom management: Patient was hospitalized 05/26/2019 to 06/06/2019 for sepsis with group C strep from ankle cellulitis. She is continuing with Augmentin 875/1 2 5  mg twice daily x21 days; expected to finish 06/26/2019. Chart review indicates that in April 2021 patient was also seen at [facility] for acute on chronic diastolic congestive heart failure. She continues on her diuretics. No coughing, no shortness of breath. Expiratory wheezing auscultated in bilateral upper lobes. Discussed finding with nursing staff and she will administer ordered breathing treatments to patient. Nursing verbalized understanding to call if symptoms worsen. Patient continues on insulin for type 2 diabetes mellitus. A1c 06/05/2019 is 8.1. Patient endorsed  pain/neuropathic pain bilateral lower extremities and said that Norco and Lyrica are effective in managing this. Patient with no complaints at this time; nursing with no concerns. Encouraged ongoing nursing care Follow up: Palliative care will continue to follow patient for goals of care clarification and symptom management. I spent 1 hour and 26 minutes providing this consultation; time iincludes time spent with patient/family, chart review, provider coordination,  and documentation. More than 50% of the time in this consultation was spent on coordinating communication  HISTORY OF PRESENT ILLNESS:  MAYLEE BARE is a 64 y.o. year old female with multiple medical problems including recent sepsis with group C strep from cellulitis of ankle, acute on chronic diastolic congestive heart failure, type 2 diabetes mellitus, COPD. Palliative Care was asked to help address goals of care.   CODE STATUS: FULL  PPS: 40% HOSPICE ELIGIBILITY/DIAGNOSIS: TBD  PAST MEDICAL HISTORY:  Past Medical History:  Diagnosis Date  . Cervical cancer ([place])   . CHF (congestive heart failure) ([place])   . Chronic anemia   . COPD (chronic obstructive pulmonary disease) ([place])   . Diabetes mellitus without complication ([place])   . Gastric ulcer   . NSTEMI (non-ST elevated myocardial infarction) ([place])   . Upper GI bleed     SOCIAL HX:  Social History   Tobacco Use  . Smoking status: Current Some Day Smoker    Packs/day: 0.25    Types: Cigarettes  . Smokeless tobacco: Never Used  Substance Use Topics  . Alcohol use: No    ALLERGIES:  Allergies  Allergen Reactions  . Latex Anaphylaxis and Rash  . Gabapentin Nausea And Vomiting and Other (See Comments)     PERTINENT MEDICATIONS:  Outpatient Encounter Medications as of 06/14/2019  Medication Sig  . Amino Acids-Protein Hydrolys (FEEDING SUPPLEMENT, PRO-STAT  SUGAR FREE 64,) LIQD Take 30 mLs by mouth 2 (two) times daily.  [name] Kitchen ammonium lactate (AMLACTIN) 12 % cream  Apply 1 g topically 2 (two) times daily.  [name] Kitchen amoxicillin-clavulanate (AUGMENTIN) 875-125 MG tablet Take 1 tablet by mouth 2 (two) times daily for 21 days.  [name] Kitchen atorvastatin (LIPITOR) 40 MG tablet Take 40 mg by mouth daily.  . collagenase (SANTYL) ointment Apply topically daily.  [name] Kitchen dronabinol (MARINOL) 2.5 MG capsule Take 1 capsule (2.5 mg total) by mouth 2 (two) times daily before lunch and supper.  . ferrous sulfate 325 (65 FE) MG tablet Take 1 tablet (325 mg total) by mouth 2 (two) times daily with a meal.  . fluticasone (FLONASE) 50 MCG/ACT nasal spray Place 2 sprays into both nostrils daily.  . folic acid (FOLVITE) 1 MG tablet Take 1 tablet (1 mg total) by mouth daily.  . furosemide (LASIX) 20 MG tablet Take 1 tablet (20 mg total) by mouth daily.  . hydrocerin (EUCERIN) CREA Apply 1 application topically 2 (two) times daily.  [name] Kitchen lactulose (CHRONULAC) 10 GM/15ML solution Take 30 mLs (20 g total) by mouth 3 (three) times daily.  . mirtazapine (REMERON) 30 MG tablet Take 30 mg by mouth at bedtime.  . Multiple Vitamin (MULTIVITAMIN WITH MINERALS) TABS tablet Take 1 tablet by mouth daily.  . Nutritional Supplements (RESOURCE 2.0 PO) Take 1 Bottle by mouth 2 (two) times daily.  . pregabalin (LYRICA) 75 MG capsule Take 1 capsule (75 mg total) by mouth 2 (two) times daily.   No facility-administered encounter medications on file as of 06/14/2019.    PHYSICAL EXAM/ROS:  General: NAD, obese, cooperative Cardiovascular: regular rate and rhythm; denies chest pain Pulmonary: clear ant fields; expiratory wheezing auscultated in bilateral upper lobes Abdomen: soft, nontender, + bowel sounds GU: no suprapubic tenderness Skin: Dry scaly skin to bilateral lower extremities, bilateral ankle wrapped in gauze; clean dry and intact. Wound nurse following Neurological: Weakness but otherwise nonfocal  Teodoro Spray, NP

## 2019-06-18 ENCOUNTER — Encounter: Payer: Self-pay | Admitting: Infectious Diseases

## 2019-06-18 ENCOUNTER — Other Ambulatory Visit: Payer: Self-pay

## 2019-06-18 ENCOUNTER — Ambulatory Visit: Payer: Medicaid Other | Attending: Infectious Diseases | Admitting: Infectious Diseases

## 2019-06-18 DIAGNOSIS — I83891 Varicose veins of right lower extremities with other complications: Secondary | ICD-10-CM

## 2019-06-18 DIAGNOSIS — R7881 Bacteremia: Secondary | ICD-10-CM

## 2019-06-18 DIAGNOSIS — R600 Localized edema: Secondary | ICD-10-CM

## 2019-06-18 NOTE — Progress Notes (Signed)
Virtual Visit -Tele visit thru face time as patient is in [facility] and no access to my chart She was recently discharged from [facility] . Was in [facility] between 05/26/19-06/06/19 for Group c strep bacteremia andb/l leg edema, anasarca ,  rt leg  superficial wounds and cellulitis. She had anasarca, CKD, diabetes, encephalopathy, lethargy. 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  pt in SNF Spoke to her nurse- thru face time As per nurse pt is in bed the whole time, lethargic , she never participates in PT, refuses dressing changes to the legs many a time  Pt did not talk to me as she was sleeping As per nurse she is eating better The have asked for palliative consult Leg seen today     Impression/recommendation   Rt leg edema, venous stasis /superficial ulcer  superficial wounds  Superficial peeling of skin with oozing-   wound care nurse at [facility] following her Pt refusing dressing  changeat SNF Hardware not involved  leucocytosis-resolved- on 5/16 it was 16.2 and on 06/12/19 it is 8.6   Recent Group C streptococcus bacteremia :resolved  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 and endocarditis ruled out As pt pulled out PICC line and refused further IV we switched her to   PO augmentin to complete total of 4 weeks of antibiotics ( from the start)  until 06/23/19    Anasarca   Anemia chronic with intermittent worsening- dut to 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

## 2019-06-24 ENCOUNTER — Ambulatory Visit: Payer: Medicaid Other | Admitting: Physician Assistant

## 2019-06-27 ENCOUNTER — Inpatient Hospital Stay (HOSPITAL_COMMUNITY)
Admission: EM | Admit: 2019-06-27 | Discharge: 2019-07-26 | DRG: 853 | Disposition: A | Discharge status: Skilled Nursing Facility | Payer: Medicaid Other | Attending: Internal Medicine | Admitting: Internal Medicine

## 2019-06-27 ENCOUNTER — Inpatient Hospital Stay (HOSPITAL_COMMUNITY): Payer: Medicaid Other

## 2019-06-27 ENCOUNTER — Emergency Department (HOSPITAL_COMMUNITY): Payer: Medicaid Other

## 2019-06-27 ENCOUNTER — Encounter (HOSPITAL_COMMUNITY): Payer: Self-pay | Admitting: Emergency Medicine

## 2019-06-27 ENCOUNTER — Other Ambulatory Visit: Payer: Self-pay

## 2019-06-27 DIAGNOSIS — K7469 Other cirrhosis of liver: Secondary | ICD-10-CM | POA: Diagnosis present

## 2019-06-27 DIAGNOSIS — J95821 Acute postprocedural respiratory failure: Secondary | ICD-10-CM | POA: Diagnosis not present

## 2019-06-27 DIAGNOSIS — I2510 Atherosclerotic heart disease of native coronary artery without angina pectoris: Secondary | ICD-10-CM | POA: Diagnosis present

## 2019-06-27 DIAGNOSIS — L97909 Non-pressure chronic ulcer of unspecified part of unspecified lower leg with unspecified severity: Secondary | ICD-10-CM | POA: Diagnosis present

## 2019-06-27 DIAGNOSIS — I7389 Other specified peripheral vascular diseases: Secondary | ICD-10-CM | POA: Diagnosis present

## 2019-06-27 DIAGNOSIS — L97419 Non-pressure chronic ulcer of right heel and midfoot with unspecified severity: Secondary | ICD-10-CM | POA: Diagnosis present

## 2019-06-27 DIAGNOSIS — E870 Hyperosmolality and hypernatremia: Secondary | ICD-10-CM | POA: Diagnosis not present

## 2019-06-27 DIAGNOSIS — B182 Chronic viral hepatitis C: Secondary | ICD-10-CM | POA: Diagnosis present

## 2019-06-27 DIAGNOSIS — A419 Sepsis, unspecified organism: Secondary | ICD-10-CM | POA: Diagnosis present

## 2019-06-27 DIAGNOSIS — D62 Acute posthemorrhagic anemia: Secondary | ICD-10-CM | POA: Diagnosis not present

## 2019-06-27 DIAGNOSIS — N1832 Chronic kidney disease, stage 3b: Secondary | ICD-10-CM | POA: Diagnosis present

## 2019-06-27 DIAGNOSIS — Z0189 Encounter for other specified special examinations: Secondary | ICD-10-CM

## 2019-06-27 DIAGNOSIS — B9562 Methicillin resistant Staphylococcus aureus infection as the cause of diseases classified elsewhere: Secondary | ICD-10-CM | POA: Diagnosis present

## 2019-06-27 DIAGNOSIS — Y835 Amputation of limb(s) as the cause of abnormal reaction of the patient, or of later complication, without mention of misadventure at the time of the procedure: Secondary | ICD-10-CM | POA: Diagnosis not present

## 2019-06-27 DIAGNOSIS — I878 Other specified disorders of veins: Secondary | ICD-10-CM | POA: Diagnosis present

## 2019-06-27 DIAGNOSIS — Z794 Long term (current) use of insulin: Secondary | ICD-10-CM | POA: Diagnosis not present

## 2019-06-27 DIAGNOSIS — G9341 Metabolic encephalopathy: Secondary | ICD-10-CM | POA: Diagnosis present

## 2019-06-27 DIAGNOSIS — I5022 Chronic systolic (congestive) heart failure: Secondary | ICD-10-CM | POA: Diagnosis not present

## 2019-06-27 DIAGNOSIS — D684 Acquired coagulation factor deficiency: Secondary | ICD-10-CM | POA: Diagnosis present

## 2019-06-27 DIAGNOSIS — E1121 Type 2 diabetes mellitus with diabetic nephropathy: Secondary | ICD-10-CM | POA: Diagnosis not present

## 2019-06-27 DIAGNOSIS — I130 Hypertensive heart and chronic kidney disease with heart failure and stage 1 through stage 4 chronic kidney disease, or unspecified chronic kidney disease: Secondary | ICD-10-CM | POA: Diagnosis present

## 2019-06-27 DIAGNOSIS — R4182 Altered mental status, unspecified: Secondary | ICD-10-CM

## 2019-06-27 DIAGNOSIS — J811 Chronic pulmonary edema: Secondary | ICD-10-CM

## 2019-06-27 DIAGNOSIS — I5032 Chronic diastolic (congestive) heart failure: Secondary | ICD-10-CM | POA: Diagnosis not present

## 2019-06-27 DIAGNOSIS — L02619 Cutaneous abscess of unspecified foot: Secondary | ICD-10-CM | POA: Diagnosis not present

## 2019-06-27 DIAGNOSIS — T8119XA Other postprocedural shock, initial encounter: Secondary | ICD-10-CM | POA: Diagnosis not present

## 2019-06-27 DIAGNOSIS — R5381 Other malaise: Secondary | ICD-10-CM | POA: Diagnosis not present

## 2019-06-27 DIAGNOSIS — I509 Heart failure, unspecified: Secondary | ICD-10-CM

## 2019-06-27 DIAGNOSIS — F419 Anxiety disorder, unspecified: Secondary | ICD-10-CM | POA: Diagnosis present

## 2019-06-27 DIAGNOSIS — Z6841 Body Mass Index (BMI) 40.0 and over, adult: Secondary | ICD-10-CM | POA: Diagnosis not present

## 2019-06-27 DIAGNOSIS — I4510 Unspecified right bundle-branch block: Secondary | ICD-10-CM | POA: Diagnosis present

## 2019-06-27 DIAGNOSIS — I959 Hypotension, unspecified: Secondary | ICD-10-CM | POA: Diagnosis not present

## 2019-06-27 DIAGNOSIS — Z888 Allergy status to other drugs, medicaments and biological substances status: Secondary | ICD-10-CM

## 2019-06-27 DIAGNOSIS — E1165 Type 2 diabetes mellitus with hyperglycemia: Secondary | ICD-10-CM | POA: Diagnosis not present

## 2019-06-27 DIAGNOSIS — G8929 Other chronic pain: Secondary | ICD-10-CM | POA: Diagnosis present

## 2019-06-27 DIAGNOSIS — M549 Dorsalgia, unspecified: Secondary | ICD-10-CM | POA: Diagnosis present

## 2019-06-27 DIAGNOSIS — Z7189 Other specified counseling: Secondary | ICD-10-CM | POA: Diagnosis not present

## 2019-06-27 DIAGNOSIS — R6520 Severe sepsis without septic shock: Secondary | ICD-10-CM | POA: Diagnosis present

## 2019-06-27 DIAGNOSIS — E1169 Type 2 diabetes mellitus with other specified complication: Secondary | ICD-10-CM | POA: Diagnosis present

## 2019-06-27 DIAGNOSIS — Z515 Encounter for palliative care: Secondary | ICD-10-CM

## 2019-06-27 DIAGNOSIS — L89892 Pressure ulcer of other site, stage 2: Secondary | ICD-10-CM | POA: Diagnosis present

## 2019-06-27 DIAGNOSIS — D509 Iron deficiency anemia, unspecified: Secondary | ICD-10-CM | POA: Diagnosis present

## 2019-06-27 DIAGNOSIS — I70234 Atherosclerosis of native arteries of right leg with ulceration of heel and midfoot: Secondary | ICD-10-CM | POA: Diagnosis not present

## 2019-06-27 DIAGNOSIS — N179 Acute kidney failure, unspecified: Secondary | ICD-10-CM | POA: Diagnosis present

## 2019-06-27 DIAGNOSIS — L97429 Non-pressure chronic ulcer of left heel and midfoot with unspecified severity: Secondary | ICD-10-CM | POA: Diagnosis present

## 2019-06-27 DIAGNOSIS — I10 Essential (primary) hypertension: Secondary | ICD-10-CM | POA: Diagnosis present

## 2019-06-27 DIAGNOSIS — L02611 Cutaneous abscess of right foot: Secondary | ICD-10-CM | POA: Diagnosis present

## 2019-06-27 DIAGNOSIS — J9601 Acute respiratory failure with hypoxia: Secondary | ICD-10-CM | POA: Diagnosis not present

## 2019-06-27 DIAGNOSIS — IMO0002 Reserved for concepts with insufficient information to code with codable children: Secondary | ICD-10-CM | POA: Diagnosis present

## 2019-06-27 DIAGNOSIS — Z9911 Dependence on respirator [ventilator] status: Secondary | ICD-10-CM | POA: Diagnosis not present

## 2019-06-27 DIAGNOSIS — Z833 Family history of diabetes mellitus: Secondary | ICD-10-CM

## 2019-06-27 DIAGNOSIS — R1310 Dysphagia, unspecified: Secondary | ICD-10-CM | POA: Diagnosis not present

## 2019-06-27 DIAGNOSIS — K219 Gastro-esophageal reflux disease without esophagitis: Secondary | ICD-10-CM | POA: Diagnosis present

## 2019-06-27 DIAGNOSIS — R0902 Hypoxemia: Secondary | ICD-10-CM

## 2019-06-27 DIAGNOSIS — D631 Anemia in chronic kidney disease: Secondary | ICD-10-CM | POA: Diagnosis present

## 2019-06-27 DIAGNOSIS — Z978 Presence of other specified devices: Secondary | ICD-10-CM

## 2019-06-27 DIAGNOSIS — I5043 Acute on chronic combined systolic (congestive) and diastolic (congestive) heart failure: Secondary | ICD-10-CM | POA: Diagnosis present

## 2019-06-27 DIAGNOSIS — Z66 Do not resuscitate: Secondary | ICD-10-CM | POA: Diagnosis not present

## 2019-06-27 DIAGNOSIS — M868X9 Other osteomyelitis, unspecified sites: Secondary | ICD-10-CM | POA: Diagnosis present

## 2019-06-27 DIAGNOSIS — E11621 Type 2 diabetes mellitus with foot ulcer: Secondary | ICD-10-CM | POA: Diagnosis present

## 2019-06-27 DIAGNOSIS — G4733 Obstructive sleep apnea (adult) (pediatric): Secondary | ICD-10-CM | POA: Diagnosis not present

## 2019-06-27 DIAGNOSIS — E8729 Other acidosis: Secondary | ICD-10-CM

## 2019-06-27 DIAGNOSIS — J449 Chronic obstructive pulmonary disease, unspecified: Secondary | ICD-10-CM | POA: Diagnosis present

## 2019-06-27 DIAGNOSIS — Z91040 Latex allergy status: Secondary | ICD-10-CM

## 2019-06-27 DIAGNOSIS — Z7401 Bed confinement status: Secondary | ICD-10-CM

## 2019-06-27 DIAGNOSIS — I5021 Acute systolic (congestive) heart failure: Secondary | ICD-10-CM

## 2019-06-27 DIAGNOSIS — R627 Adult failure to thrive: Secondary | ICD-10-CM | POA: Diagnosis present

## 2019-06-27 DIAGNOSIS — Z79899 Other long term (current) drug therapy: Secondary | ICD-10-CM

## 2019-06-27 DIAGNOSIS — D696 Thrombocytopenia, unspecified: Secondary | ICD-10-CM | POA: Diagnosis not present

## 2019-06-27 DIAGNOSIS — Z8249 Family history of ischemic heart disease and other diseases of the circulatory system: Secondary | ICD-10-CM

## 2019-06-27 DIAGNOSIS — E1122 Type 2 diabetes mellitus with diabetic chronic kidney disease: Secondary | ICD-10-CM | POA: Diagnosis present

## 2019-06-27 DIAGNOSIS — R079 Chest pain, unspecified: Secondary | ICD-10-CM

## 2019-06-27 DIAGNOSIS — J9811 Atelectasis: Secondary | ICD-10-CM | POA: Diagnosis present

## 2019-06-27 DIAGNOSIS — G9340 Encephalopathy, unspecified: Secondary | ICD-10-CM | POA: Diagnosis not present

## 2019-06-27 DIAGNOSIS — E875 Hyperkalemia: Secondary | ICD-10-CM | POA: Diagnosis not present

## 2019-06-27 DIAGNOSIS — I83009 Varicose veins of unspecified lower extremity with ulcer of unspecified site: Secondary | ICD-10-CM | POA: Diagnosis present

## 2019-06-27 DIAGNOSIS — I071 Rheumatic tricuspid insufficiency: Secondary | ICD-10-CM | POA: Diagnosis present

## 2019-06-27 DIAGNOSIS — L89159 Pressure ulcer of sacral region, unspecified stage: Secondary | ICD-10-CM | POA: Diagnosis present

## 2019-06-27 DIAGNOSIS — K7200 Acute and subacute hepatic failure without coma: Secondary | ICD-10-CM | POA: Diagnosis not present

## 2019-06-27 DIAGNOSIS — Z20822 Contact with and (suspected) exposure to covid-19: Secondary | ICD-10-CM | POA: Diagnosis present

## 2019-06-27 DIAGNOSIS — I252 Old myocardial infarction: Secondary | ICD-10-CM

## 2019-06-27 DIAGNOSIS — F17210 Nicotine dependence, cigarettes, uncomplicated: Secondary | ICD-10-CM | POA: Diagnosis present

## 2019-06-27 DIAGNOSIS — I872 Venous insufficiency (chronic) (peripheral): Secondary | ICD-10-CM | POA: Diagnosis not present

## 2019-06-27 DIAGNOSIS — I214 Non-ST elevation (NSTEMI) myocardial infarction: Secondary | ICD-10-CM | POA: Diagnosis not present

## 2019-06-27 DIAGNOSIS — K7290 Hepatic failure, unspecified without coma: Secondary | ICD-10-CM | POA: Diagnosis present

## 2019-06-27 DIAGNOSIS — J430 Unilateral pulmonary emphysema [MacLeod's syndrome]: Secondary | ICD-10-CM

## 2019-06-27 DIAGNOSIS — Z8541 Personal history of malignant neoplasm of cervix uteri: Secondary | ICD-10-CM

## 2019-06-27 DIAGNOSIS — G4730 Sleep apnea, unspecified: Secondary | ICD-10-CM | POA: Diagnosis present

## 2019-06-27 DIAGNOSIS — M609 Myositis, unspecified: Secondary | ICD-10-CM | POA: Diagnosis present

## 2019-06-27 DIAGNOSIS — E872 Acidosis: Secondary | ICD-10-CM | POA: Diagnosis present

## 2019-06-27 DIAGNOSIS — L97208 Non-pressure chronic ulcer of unspecified calf with other specified severity: Secondary | ICD-10-CM | POA: Diagnosis not present

## 2019-06-27 DIAGNOSIS — R0600 Dyspnea, unspecified: Secondary | ICD-10-CM

## 2019-06-27 DIAGNOSIS — I70244 Atherosclerosis of native arteries of left leg with ulceration of heel and midfoot: Secondary | ICD-10-CM | POA: Diagnosis not present

## 2019-06-27 DIAGNOSIS — I5033 Acute on chronic diastolic (congestive) heart failure: Secondary | ICD-10-CM | POA: Diagnosis not present

## 2019-06-27 DIAGNOSIS — Z8711 Personal history of peptic ulcer disease: Secondary | ICD-10-CM

## 2019-06-27 LAB — MAGNESIUM: Magnesium: 1.8 mg/dL (ref 1.7–2.4)

## 2019-06-27 LAB — CBC WITH DIFFERENTIAL/PLATELET
Abs Immature Granulocytes: 0.09 10*3/uL — ABNORMAL HIGH (ref 0.00–0.07)
Basophils Absolute: 0.1 10*3/uL (ref 0.0–0.1)
Basophils Relative: 1 %
Eosinophils Absolute: 0.1 10*3/uL (ref 0.0–0.5)
Eosinophils Relative: 1 %
HCT: 33.7 % — ABNORMAL LOW (ref 36.0–46.0)
Hemoglobin: 8.8 g/dL — ABNORMAL LOW (ref 12.0–15.0)
Immature Granulocytes: 1 %
Lymphocytes Relative: 8 %
Lymphs Abs: 0.9 10*3/uL (ref 0.7–4.0)
MCH: 22.2 pg — ABNORMAL LOW (ref 26.0–34.0)
MCHC: 26.1 g/dL — ABNORMAL LOW (ref 30.0–36.0)
MCV: 85.1 fL (ref 80.0–100.0)
Monocytes Absolute: 1.1 10*3/uL — ABNORMAL HIGH (ref 0.1–1.0)
Monocytes Relative: 10 %
Neutro Abs: 8.8 10*3/uL — ABNORMAL HIGH (ref 1.7–7.7)
Neutrophils Relative %: 79 %
Platelets: 181 10*3/uL (ref 150–400)
RBC: 3.96 MIL/uL (ref 3.87–5.11)
RDW: 26.4 % — ABNORMAL HIGH (ref 11.5–15.5)
WBC: 11 10*3/uL — ABNORMAL HIGH (ref 4.0–10.5)
nRBC: 0.2 % (ref 0.0–0.2)

## 2019-06-27 LAB — BASIC METABOLIC PANEL
Anion gap: 10 (ref 5–15)
BUN: 38 mg/dL — ABNORMAL HIGH (ref 8–23)
CO2: 22 mmol/L (ref 22–32)
Calcium: 7.7 mg/dL — ABNORMAL LOW (ref 8.9–10.3)
Chloride: 111 mmol/L (ref 98–111)
Creatinine, Ser: 2.25 mg/dL — ABNORMAL HIGH (ref 0.44–1.00)
GFR calc Af Amer: 26 mL/min — ABNORMAL LOW (ref >60)
GFR calc non Af Amer: 22 mL/min — ABNORMAL LOW (ref >60)
Glucose, Bld: 124 mg/dL — ABNORMAL HIGH (ref 70–99)
Potassium: 5.5 mmol/L — ABNORMAL HIGH (ref 3.5–5.1)
Sodium: 143 mmol/L (ref 135–145)

## 2019-06-27 LAB — BLOOD GAS, ARTERIAL
Acid-base deficit: 3.6 mmol/L — ABNORMAL HIGH (ref 0.0–2.0)
Acid-base deficit: 2.6 mmol/L — ABNORMAL HIGH (ref 0.0–2.0)
Acid-base deficit: 2.6 mmol/L — ABNORMAL HIGH (ref 0.0–2.0)
Bicarbonate: 23.6 mmol/L (ref 20.0–28.0)
Bicarbonate: 23.6 mmol/L (ref 20.0–28.0)
Bicarbonate: 23.3 mmol/L (ref 20.0–28.0)
FIO2: 36
FIO2: 35
FIO2: 30
O2 Saturation: 98 %
O2 Saturation: 98.3 %
O2 Saturation: 96.5 %
Patient temperature: 98.6
Patient temperature: 99.8
Patient temperature: 98.6
pCO2 arterial: 58.5 mmHg — ABNORMAL HIGH (ref 32.0–48.0)
pCO2 arterial: 53.4 mmHg — ABNORMAL HIGH (ref 32.0–48.0)
pCO2 arterial: 49.3 mmHg — ABNORMAL HIGH (ref 32.0–48.0)
pH, Arterial: 7.23 — ABNORMAL LOW (ref 7.350–7.450)
pH, Arterial: 7.272 — ABNORMAL LOW (ref 7.350–7.450)
pH, Arterial: 7.296 — ABNORMAL LOW (ref 7.350–7.450)
pO2, Arterial: 140 mmHg — ABNORMAL HIGH (ref 83.0–108.0)
pO2, Arterial: 117 mmHg — ABNORMAL HIGH (ref 83.0–108.0)
pO2, Arterial: 97.5 mmHg (ref 83.0–108.0)

## 2019-06-27 LAB — COMPREHENSIVE METABOLIC PANEL
ALT: 16 U/L (ref 0–44)
AST: 30 U/L (ref 15–41)
Albumin: 2.1 g/dL — ABNORMAL LOW (ref 3.5–5.0)
Alkaline Phosphatase: 218 U/L — ABNORMAL HIGH (ref 38–126)
Anion gap: 8 (ref 5–15)
BUN: 39 mg/dL — ABNORMAL HIGH (ref 8–23)
CO2: 23 mmol/L (ref 22–32)
Calcium: 8.2 mg/dL — ABNORMAL LOW (ref 8.9–10.3)
Chloride: 109 mmol/L (ref 98–111)
Creatinine, Ser: 2.4 mg/dL — ABNORMAL HIGH (ref 0.44–1.00)
GFR calc Af Amer: 24 mL/min — ABNORMAL LOW (ref >60)
GFR calc non Af Amer: 21 mL/min — ABNORMAL LOW (ref >60)
Glucose, Bld: 139 mg/dL — ABNORMAL HIGH (ref 70–99)
Potassium: 6.1 mmol/L — ABNORMAL HIGH (ref 3.5–5.1)
Sodium: 140 mmol/L (ref 135–145)
Total Bilirubin: 1 mg/dL (ref 0.3–1.2)
Total Protein: 9.1 g/dL — ABNORMAL HIGH (ref 6.5–8.1)

## 2019-06-27 LAB — PHOSPHORUS: Phosphorus: 4.6 mg/dL (ref 2.5–4.6)

## 2019-06-27 LAB — ECHOCARDIOGRAM COMPLETE
Height: 65 in
Weight: 4056.46 oz

## 2019-06-27 LAB — URINALYSIS, ROUTINE W REFLEX MICROSCOPIC
Bilirubin Urine: NEGATIVE
Glucose, UA: NEGATIVE mg/dL
Hgb urine dipstick: NEGATIVE
Ketones, ur: NEGATIVE mg/dL
Leukocytes,Ua: NEGATIVE
Nitrite: NEGATIVE
Protein, ur: NEGATIVE mg/dL
Specific Gravity, Urine: 1.011 (ref 1.005–1.030)
pH: 5 (ref 5.0–8.0)

## 2019-06-27 LAB — CBG MONITORING, ED: Glucose-Capillary: 129 mg/dL — ABNORMAL HIGH (ref 70–99)

## 2019-06-27 LAB — LACTIC ACID, PLASMA
Lactic Acid, Venous: 1.5 mmol/L (ref 0.5–1.9)
Lactic Acid, Venous: 1.8 mmol/L (ref 0.5–1.9)
Lactic Acid, Venous: 2 mmol/L (ref 0.5–1.9)

## 2019-06-27 LAB — BRAIN NATRIURETIC PEPTIDE: B Natriuretic Peptide: 1195.3 pg/mL — ABNORMAL HIGH (ref 0.0–100.0)

## 2019-06-27 LAB — AMMONIA: Ammonia: 35 umol/L (ref 9–35)

## 2019-06-27 LAB — TROPONIN I (HIGH SENSITIVITY): Troponin I (High Sensitivity): 38 ng/L — ABNORMAL HIGH (ref <18)

## 2019-06-27 LAB — SARS CORONAVIRUS 2 BY RT PCR (HOSPITAL ORDER, PERFORMED IN ~~LOC~~ HOSPITAL LAB): SARS Coronavirus 2: NEGATIVE

## 2019-06-27 MED ORDER — FUROSEMIDE 10 MG/ML IJ SOLN
40.0000 mg | Freq: Once | INTRAMUSCULAR | Status: AC
  Administered 2019-06-27: 40 mg via INTRAVENOUS
  Filled 2019-06-27: qty 4

## 2019-06-27 MED ORDER — VANCOMYCIN HCL 1250 MG/250ML IV SOLN
1250.0000 mg | INTRAVENOUS | Status: DC
  Administered 2019-06-28 – 2019-06-30 (×3): 1250 mg via INTRAVENOUS
  Filled 2019-06-27 (×4): qty 250

## 2019-06-27 MED ORDER — VANCOMYCIN HCL 2000 MG/400ML IV SOLN
2000.0000 mg | Freq: Once | INTRAVENOUS | Status: AC
  Administered 2019-06-27: 2000 mg via INTRAVENOUS
  Filled 2019-06-27: qty 400

## 2019-06-27 MED ORDER — SODIUM CHLORIDE 0.9 % IV SOLN
2.0000 g | Freq: Two times a day (BID) | INTRAVENOUS | Status: AC
  Administered 2019-06-27 – 2019-07-06 (×20): 2 g via INTRAVENOUS
  Filled 2019-06-27 (×22): qty 2

## 2019-06-27 MED ORDER — ACETAMINOPHEN 650 MG RE SUPP: 650.0000 mg | Freq: Four times a day (QID) | RECTAL | Status: DC | PRN

## 2019-06-27 MED ORDER — HEPARIN SODIUM (PORCINE) 5000 UNIT/ML IJ SOLN
5000.0000 [IU] | Freq: Three times a day (TID) | INTRAMUSCULAR | Status: DC
  Administered 2019-06-27 – 2019-07-07 (×31): 5000 [IU] via SUBCUTANEOUS
  Filled 2019-06-27 (×30): qty 1

## 2019-06-27 MED ORDER — LORAZEPAM 2 MG/ML IJ SOLN
2.0000 mg | Freq: Once | INTRAMUSCULAR | Status: AC
  Administered 2019-06-27: 2 mg via INTRAVENOUS
  Filled 2019-06-27: qty 1

## 2019-06-27 MED ORDER — LORAZEPAM 2 MG/ML IJ SOLN
2.0000 mg | Freq: Once | INTRAMUSCULAR | Status: DC
  Filled 2019-06-27: qty 1

## 2019-06-27 MED ORDER — FUROSEMIDE 10 MG/ML IJ SOLN
60.0000 mg | Freq: Three times a day (TID) | INTRAMUSCULAR | Status: DC
  Administered 2019-06-27 (×3): 60 mg via INTRAVENOUS
  Filled 2019-06-27: qty 8
  Filled 2019-06-27: qty 6
  Filled 2019-06-27: qty 8
  Filled 2019-06-27: qty 6

## 2019-06-27 MED ORDER — ACETAMINOPHEN 325 MG PO TABS: 650.0000 mg | ORAL_TABLET | Freq: Four times a day (QID) | ORAL | Status: DC | PRN

## 2019-06-27 NOTE — ED Notes (Signed)
Date and time results received: 06/27/19 3:14 PM  (use smartphrase ".now" to insert current time)  Test: Lactic Critical Value: 2.0  Name of Provider Notified: Sherral Hammers  Orders Received? Or Actions Taken?: Orders Received - See Orders for details

## 2019-06-27 NOTE — Progress Notes (Signed)
Pt transported to 1430 on BIPAP without complication.  RT to monitor and assess as needed.

## 2019-06-27 NOTE — Progress Notes (Signed)
  Echocardiogram 2D Echocardiogram has been performed.  [name] 06/27/2019, 1:44 PM

## 2019-06-27 NOTE — ED Notes (Signed)
ED TO INPATIENT HANDOFF REPORT  Name/Age/Gender [name] 64 y.o. female  Code Status    Code Status Orders  (From admission, onward)         Start     Ordered   06/27/19 1147  Full code  Continuous        06/27/19 1151        Code Status History    Date Active Date Inactive Code Status Order ID Comments User Context   05/28/2019 1029 06/06/2019 2228 Full Code [id number]  Karie Kirks, DO Inpatient   05/04/2019 1534 05/10/2019 2332 Full Code [id number]  Debbe Odea, MD ED   02/23/2019 2229 02/27/2019 1835 Full Code [id number]  Lenore Cordia, MD ED   12/16/2018 1704 01/02/2019 1845 Full Code [id number]  Eugenie Filler, MD ED   11/20/2018 1812 12/08/2018 0528 Full Code [id number]  Modena Nunnery D, DO ED   10/26/2018 0048 11/08/2018 0023 Full Code [id number]  Lang Snow, NP ED   08/17/2018 1820 08/22/2018 1805 Full Code [id number]  Dustin Flock, MD Inpatient   06/20/2018 2312 06/27/2018 2119 Full Code [id number]  Mayer Camel, NP ED   01/13/2018 1540 01/16/2018 1859 Full Code [id number]  Loletha Grayer, MD ED   11/03/2017 1758 11/06/2017 2136 Full Code [id number]  Salary, Avel Peace, MD Inpatient   09/26/2017 2353 09/30/2017 1404 Full Code [id number]  Lance Coon, MD Inpatient   08/27/2017 0347 08/29/2017 2054 Full Code [id number]  Harrie Foreman, MD Inpatient   08/07/2017 1704 08/09/2017 2237 Full Code [id number]  Saundra Shelling, MD Inpatient   12/18/2016 1146 12/26/2016 2032 Full Code [id number]  Vaughan Basta, MD Inpatient   12/18/2016 0959 12/18/2016 1146 Full Code [id number]  Vaughan Basta, MD ED   12/04/2015 0509 12/07/2015 1756 Full Code [id number]  Holley Raring, NP ED   10/28/2015 1640 10/29/2015 1711 Full Code [id number]  Bettey Costa, MD Inpatient   08/29/2015 1438 08/30/2015 1750 Full Code [id number]  Hugelmeyer, Ubaldo Glassing, DO Inpatient   Advance Care Planning Activity      Home/SNF/Other Nursing Home  Chief Complaint Acute metabolic  encephalopathy [I96.78]  Level of Care/Admitting Diagnosis ED Disposition    ED Disposition Condition [place]: [place] [[id number]]  Level of Care: Progressive [102]  Admit to Progressive based on following criteria: MULTISYSTEM THREATS such as stable sepsis, metabolic/electrolyte imbalance with or without encephalopathy that is responding to early treatment.  May [facility] patient to [facility] if equivalent level of care is available:: Yes  Covid Evaluation: Confirmed COVID Negative  Diagnosis: Acute metabolic encephalopathy [9381017]  Admitting Physician: Allie Bossier [[id number]]  Attending Physician: Allie Bossier [[id number]]  Estimated length of stay: 5 - 7 days  Certification:: I certify this patient will need inpatient services for at least 2 midnights       Medical History Past Medical History:  Diagnosis Date  . Cervical cancer ([place])   . CHF (congestive heart failure) ([place])   . Chronic anemia   . COPD (chronic obstructive pulmonary disease) ([place])   . Diabetes mellitus without complication ([place])   . Gastric ulcer   . NSTEMI (non-ST elevated myocardial infarction) ([place])   . Upper GI bleed     Allergies Allergies  Allergen Reactions  . Latex Anaphylaxis and Rash  . Gabapentin Nausea And Vomiting and Other (See Comments)    IV Location/Drains/Wounds Patient Lines/Drains/Airways Status    Active Line/Drains/Airways    Name  Placement date Placement time Site Days   Peripheral IV 06/27/19 Anterior;Left Hand 06/27/19  0706  Hand  less than 1   External Urinary Catheter 05/05/19  2000  --  53   Pressure Injury 05/05/19 Thigh Distal;Left;Posterior Unstageable - Full thickness tissue loss in which the base of the injury is covered by slough (yellow, tan, gray, green or brown) and/or eschar (tan, brown or black) in the wound bed. 4"x4" area with m 05/05/19  0700   53   Wound / Incision (Open or Dehisced) 02/24/19 Other  (Comment) Toe (Comment  which one) Anterior;Left 02/24/19  1000  Toe (Comment  which one)  123   Wound / Incision (Open or Dehisced) 05/27/19 Non-pressure wound Pretibial Left;Anterior patchy area of partial thickness wounds, red, dry 05/27/19  1226  Pretibial  31   Wound / Incision (Open or Dehisced) 05/27/19 Pretibial Right;Anterior partial thickness in patchy areas, yellow, moist, mod drainage, 05/27/19  1226  Pretibial  31          Labs/Imaging Results for orders placed or performed during the hospital encounter of 06/27/19 (from the past 48 hour(s))  CBC with Differential     Status: Abnormal   Collection Time: 06/27/19  5:36 AM  Result Value Ref Range   WBC 11.0 (H) 4.0 - 10.5 K/uL   RBC 3.96 3.87 - 5.11 MIL/uL   Hemoglobin 8.8 (L) 12.0 - 15.0 g/dL   HCT 33.7 (L) 36 - 46 %   MCV 85.1 80.0 - 100.0 fL   MCH 22.2 (L) 26.0 - 34.0 pg   MCHC 26.1 (L) 30.0 - 36.0 g/dL   RDW 26.4 (H) 11.5 - 15.5 %   Platelets 181 150 - 400 K/uL   nRBC 0.2 0.0 - 0.2 %   Neutrophils Relative % 79 %   Neutro Abs 8.8 (H) 1.7 - 7.7 K/uL   Lymphocytes Relative 8 %   Lymphs Abs 0.9 0.7 - 4.0 K/uL   Monocytes Relative 10 %   Monocytes Absolute 1.1 (H) 0 - 1 K/uL   Eosinophils Relative 1 %   Eosinophils Absolute 0.1 0 - 0 K/uL   Basophils Relative 1 %   Basophils Absolute 0.1 0 - 0 K/uL   Immature Granulocytes 1 %   Abs Immature Granulocytes 0.09 (H) 0.00 - 0.07 K/uL    Comment: Performed at [facility], [place] [address], [place], [place] [address]  Comprehensive metabolic panel     Status: Abnormal   Collection Time: 06/27/19  5:36 AM  Result Value Ref Range   Sodium 140 135 - 145 mmol/L   Potassium 6.1 (H) 3.5 - 5.1 mmol/L    Comment: NO VISIBLE HEMOLYSIS   Chloride 109 98 - 111 mmol/L   CO2 23 22 - 32 mmol/L   Glucose, Bld 139 (H) 70 - 99 mg/dL    Comment: Glucose reference range applies only to samples taken after fasting for at least 8 hours.   BUN 39 (H) 8 - 23 mg/dL    Creatinine, Ser 2.40 (H) 0.44 - 1.00 mg/dL   Calcium 8.2 (L) 8.9 - 10.3 mg/dL   Total Protein 9.1 (H) 6.5 - 8.1 g/dL   Albumin 2.1 (L) 3.5 - 5.0 g/dL   AST 30 15 - 41 U/L   ALT 16 0 - 44 U/L   Alkaline Phosphatase 218 (H) 38 - 126 U/L   Total Bilirubin 1.0 0.3 - 1.2 mg/dL   GFR calc non Af Amer 21 (L) >  60 mL/min   GFR calc Af Amer 24 (L) >60 mL/min   Anion gap 8 5 - 15    Comment: Performed at [facility], [place] [address], [place], [place] [address]  Troponin I (High Sensitivity)     Status: Abnormal   Collection Time: 06/27/19  5:36 AM  Result Value Ref Range   Troponin I (High Sensitivity) 38 (H) <18 ng/L    Comment: (NOTE) Elevated high sensitivity troponin I (hsTnI) values and significant  changes across serial measurements may suggest ACS but many other  chronic and acute conditions are known to elevate hsTnI results.  Refer to the "Links" section for chest pain algorithms and additional  guidance. Performed at [facility], [place] [address] [place], [place] [address]   Blood gas, arterial     Status: Abnormal   Collection Time: 06/27/19  5:38 AM  Result Value Ref Range   FIO2 36.00    pH, Arterial 7.230 (L) 7.35 - 7.45   pCO2 arterial 58.5 (H) 32 - 48 mmHg   pO2, Arterial 140 (H) 83 - 108 mmHg   Bicarbonate 23.6 20.0 - 28.0 mmol/L   Acid-base deficit 3.6 (H) 0.0 - 2.0 mmol/L   O2 Saturation 98.0 %   Patient temperature 98.6    Allens test (pass/fail) PASS PASS    Comment: Performed at [facility], [place] [address] [place], [place] [address]  Brain natriuretic peptide     Status: Abnormal   Collection Time: 06/27/19  5:39 AM  Result Value Ref Range   B Natriuretic Peptide 1,195.3 (H) 0.0 - 100.0 pg/mL    Comment: Performed at [facility], [place] [address] [place], [place] [address]  Lactic acid, plasma     Status: None   Collection Time: 06/27/19  5:41 AM  Result Value Ref Range   Lactic  Acid, Venous 1.5 0.5 - 1.9 mmol/L    Comment: Performed at [facility], [place] [address], [place], [place] [address]  SARS Coronavirus 2 by RT PCR (hospital order, performed in [facility] hospital lab) Nasopharyngeal Nasopharyngeal Swab     Status: None   Collection Time: 06/27/19  6:26 AM   Specimen: Nasopharyngeal Swab  Result Value Ref Range   SARS Coronavirus 2 NEGATIVE NEGATIVE    Comment: (NOTE) SARS-CoV-2 target nucleic acids are NOT DETECTED.  The SARS-CoV-2 RNA is generally detectable in upper and lower respiratory specimens during the acute phase of infection. The lowest concentration of SARS-CoV-2 viral copies this assay can detect is 250 copies / mL. A negative result does not preclude SARS-CoV-2 infection and should not be used as the sole basis for treatment or other patient management decisions.  A negative result may occur with improper specimen collection / handling, submission of specimen other than nasopharyngeal swab, presence of viral mutation(s) within the areas targeted by this assay, and inadequate number of viral copies (<250 copies / mL). A negative result must be combined with clinical observations, patient history, and epidemiological information.  Fact Sheet for Patients:   StrictlyIdeas.no  Fact Sheet for Healthcare Providers: BankingDealers.co.za  This test is not yet approved or  cleared by the [place] FDA and has been authorized for detection and/or diagnosis of SARS-CoV-2 by FDA under an Emergency Use Authorization (EUA).  This EUA will remain in effect (meaning this test can be used) for the duration of the COVID-19 declaration under Section 564(b)(1) of the Act, 21 U.S.C. section 360bbb-3(b)(1),  unless the authorization is terminated or revoked sooner.  Performed at [facility], [place] [address] [place], [place] [address]   CBG monitoring, ED     Status:  Abnormal   Collection Time: 06/27/19  6:51 AM  Result Value Ref Range   Glucose-Capillary 129 (H) 70 - 99 mg/dL    Comment: Glucose reference range applies only to samples taken after fasting for at least 8 hours.  Ammonia     Status: None   Collection Time: 06/27/19  6:59 AM  Result Value Ref Range   Ammonia 35 9 - 35 umol/L    Comment: Performed at [facility], [place] [address] [place], [place] [address]  Blood gas, arterial     Status: Abnormal   Collection Time: 06/27/19 10:12 AM  Result Value Ref Range   FIO2 35.00    pH, Arterial 7.272 (L) 7.35 - 7.45   pCO2 arterial 53.4 (H) 32 - 48 mmHg   pO2, Arterial 117 (H) 83 - 108 mmHg   Bicarbonate 23.6 20.0 - 28.0 mmol/L   Acid-base deficit 2.6 (H) 0.0 - 2.0 mmol/L   O2 Saturation 98.3 %   Patient temperature 99.8     Comment: Performed at [facility], [place] [address], [place], [place] [address]  Basic metabolic panel     Status: Abnormal   Collection Time: 06/27/19 10:44 AM  Result Value Ref Range   Sodium 143 135 - 145 mmol/L   Potassium 5.5 (H) 3.5 - 5.1 mmol/L   Chloride 111 98 - 111 mmol/L   CO2 22 22 - 32 mmol/L   Glucose, Bld 124 (H) 70 - 99 mg/dL    Comment: Glucose reference range applies only to samples taken after fasting for at least 8 hours.   BUN 38 (H) 8 - 23 mg/dL   Creatinine, Ser 2.25 (H) 0.44 - 1.00 mg/dL   Calcium 7.7 (L) 8.9 - 10.3 mg/dL   GFR calc non Af Amer 22 (L) >60 mL/min   GFR calc Af Amer 26 (L) >60 mL/min   Anion gap 10 5 - 15    Comment: Performed at [facility], [place] [address] [place], [place] [address]  Magnesium     Status: None   Collection Time: 06/27/19 10:44 AM  Result Value Ref Range   Magnesium 1.8 1.7 - 2.4 mg/dL    Comment: Performed at [facility], [place] [address], [place], [place] [address]  Phosphorus     Status: None   Collection Time: 06/27/19 10:44 AM  Result Value Ref Range   Phosphorus 4.6 2.5 -  4.6 mg/dL    Comment: Performed at [facility], [place] [address] [place], [place] [address]  Lactic acid, plasma     Status: None   Collection Time: 06/27/19 10:44 AM  Result Value Ref Range   Lactic Acid, Venous 1.8 0.5 - 1.9 mmol/L    Comment: Performed at [facility], [place] [address], [place],  [address]  Urinalysis, Routine w reflex microscopic     Status: None   Collection Time: 06/27/19 12:42 PM  Result Value Ref Range   Color, Urine YELLOW YELLOW   APPearance CLEAR CLEAR   Specific Gravity, Urine 1.011 1.005 - 1.030   pH 5.0 5.0 - 8.0   Glucose, UA NEGATIVE NEGATIVE mg/dL   Hgb urine dipstick NEGATIVE NEGATIVE   Bilirubin Urine NEGATIVE NEGATIVE   Ketones, ur NEGATIVE NEGATIVE mg/dL  Protein, ur NEGATIVE NEGATIVE mg/dL   Nitrite NEGATIVE NEGATIVE   Leukocytes,Ua NEGATIVE NEGATIVE    Comment: Performed at [place] [address], [place], [place] [address]  Lactic acid, plasma     Status: Abnormal   Collection Time: 06/27/19  2:02 PM  Result Value Ref Range   Lactic Acid, Venous 2.0 (HH) 0.5 - 1.9 mmol/L    Comment: CRITICAL RESULT CALLED TO, READ BACK BY AND VERIFIED WITH: M.MCIVER AT 1514 ON 06/27/19 BY N.THOMPSON Performed at [facility], [place] [address] [place], [place] [address]    CT Head Wo Contrast  Result Date: 06/27/2019 CLINICAL DATA:  Altered mental status EXAM: CT HEAD WITHOUT CONTRAST TECHNIQUE: Contiguous axial images were obtained from the base of the skull through the vertex without intravenous contrast. COMPARISON:  11/20/2018 FINDINGS: Brain: There is atrophy and chronic small vessel disease changes. Old bilateral basal ganglia lacunar infarcts. No acute intracranial abnormality. Specifically, no hemorrhage, hydrocephalus, mass lesion, acute infarction, or significant intracranial injury. Vascular: No hyperdense vessel or unexpected calcification. Skull: No acute  calvarial abnormality. Sinuses/Orbits: Visualized paranasal sinuses and mastoids clear. Orbital soft tissues unremarkable. Other: None IMPRESSION: Old bilateral basal ganglia lacunar infarcts. Atrophy, chronic microvascular disease. No acute intracranial abnormality. Electronically Signed   By: Rolm Baptise M.D.   On: 06/27/2019 09:21   MR FOOT RIGHT WO CONTRAST  Result Date: 06/27/2019 CLINICAL DATA:  Right foot wound EXAM: MRI OF THE RIGHT FOREFOOT WITHOUT CONTRAST TECHNIQUE: Multiplanar, multisequence MR imaging of the right forefoot was performed. No intravenous contrast was administered. COMPARISON:  MRI 06/01/2019 FINDINGS: Bones/Joint/Cartilage No acute fracture. No dislocation. No bone marrow edema. There is preservation of the fatty T1 bone marrow signal throughout the right forefoot. No cortical destruction. Similar mild degenerative changes. No large joint effusion. Ligaments Intact Lisfranc ligament. Collateral ligaments of the forefoot appear intact. Muscles and Tendons Diffuse edema like signal throughout the intrinsic foot musculature suggesting a nonspecific myositis. Small volume tenosynovial fluid within the extensor digitorum longus tendon sheath at the level of the midfoot. Flexor tendons appear unremarkable. Soft tissues Marked dorsal soft tissue swelling and subcutaneous edema with multiple areas of dorsal skin irregularity suggesting wounds or ulcerations. Slightly ill-defined multilobulated fluid collection at the dorsal aspect of the proximal forefoot laterally measuring approximately 4.8 x 1.0 x 7.4 cm (series 5, image 36; series 6, image 14). Numerous additional lobulated areas of fluid are seen more proximally within the midfoot as well as the medial hindfoot (series 5, images 44 and 51). There is intermetatarsal bursal fluid within the first and second intermetatarsal spaces. IMPRESSION: 1. No evidence of acute osteomyelitis of the right forefoot. 2. Marked dorsal soft tissue  swelling and subcutaneous edema with multiple areas of dorsal skin irregularity suggesting wounds or ulcerations. Slightly ill-defined multilobulated fluid collection at the dorsal aspect of the proximal forefoot laterally measuring 4.8 x 1.0 x 7.4 cm. Numerous additional lobulated areas of fluid are seen more proximally within the dorsal midfoot as well as the medial hindfoot. Findings are concerning for developing abscesses. 3. Diffuse edema like signal throughout the intrinsic foot musculature suggesting a nonspecific myositis. 4. Mild extensor digitorum longus tenosynovitis. Electronically Signed   By: Davina Poke D.O.   On: 06/27/2019 19:10   MR FOOT LEFT WO CONTRAST  Result Date: 06/27/2019 CLINICAL DATA:  Left foot ulceration EXAM: MRI OF THE LEFT FOOT WITHOUT CONTRAST TECHNIQUE: Multiplanar, multisequence MR imaging of the left forefoot was performed. No intravenous contrast  was administered. COMPARISON:  X-ray 06/21/2018 FINDINGS: Technical note: Significantly limited exam. The obtained sequences are significantly motion degraded. No short axis fluid sensitive sequence was obtained. No sagittal sequence was obtained. 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 Subtle edema within the first metatarsal head with preservation of the fatty T1 bone marrow signal. The remaining included osseous structures appear to have preserved T1 marrow signal. No evidence of cortical destruction. No acute fracture identified. No dislocation. Ligaments Grossly intact. Muscles and Tendons Diffuse edema like intramuscular signal suggesting a nonspecific myositis. No appreciable tenosynovial fluid collection on the included images. Soft tissues No evidence of a large soft tissue ulceration. No appreciable fluid collection. There is mild soft tissue swelling. IMPRESSION: 1. Significantly limited, motion degraded exam. Patient was unable to complete the full examination. 2.  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 Subtle edema within the first metatarsal head with preservation of the fatty T1 bone marrow signal. Findings are favored to represent reactive osteitis. Early acute osteomyelitis is felt to be unlikely but would be difficult to entirely exclude on the provided images. 3. Diffuse edema-like intramuscular signal suggesting a nonspecific myositis. Electronically Signed   By: Davina Poke D.O.   On: 06/27/2019 19:17   DG Chest Portable 1 View  Result Date: 06/27/2019 CLINICAL DATA:  Hypoxia. EXAM: PORTABLE CHEST 1 VIEW COMPARISON:  05/26/2019 FINDINGS: Cardiomegaly. 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 Diffuse hazy appearance of the lungs with pleural fluid and vascular congestion. No visible air leak. IMPRESSION: Low volume chest with hazy opacity from pleural fluid and presumed atelectasis. Suspect CHF. Electronically Signed   By: Monte Fantasia M.D.   On: 06/27/2019 06:09   ECHOCARDIOGRAM COMPLETE  Result Date: 06/27/2019    ECHOCARDIOGRAM REPORT   Patient Name:   ELOISE PICONE Date of Exam: 06/27/2019 Medical Rec #:  [id number]         Height:       65.0 in Accession #:    [id number]        Weight:       253.5 lb Date of Birth:  1955-05-10         BSA:          2.188 m Patient Age:    8 years          BP:           119/100 mmHg Patient Gender: F                 HR:           85 bpm. Exam Location:  Inpatient Procedure: 2D Echo, Cardiac Doppler and Color Doppler Indications:    CHF-Acute Systolic 008.67 / Y19.50  History:        Patient has prior history of Echocardiogram examinations, most                 recent 05/30/2019. CHF and Cardiomyopathy, CAD, COPD,                 Arrythmias:non-specific ST changes; Risk Factors:Hypertension,                 Diabetes, Sleep Apnea and Current Smoker. GERD.  Sonographer:    Vickie Epley RDCS Referring Phys: [id number] [place]  1. Septal dyskinesis consistent with  RBBB. Left ventricular ejection fraction, by estimation, is 40 to 45%. The left ventricle has mildly decreased  function. The left ventricle has no regional wall motion abnormalities. Left ventricular diastolic parameters are consistent with Grade II diastolic dysfunction (pseudonormalization). Elevated left ventricular end-diastolic pressure.  2. Right ventricular systolic function is moderately reduced. The right ventricular size is normal. There is mildly elevated pulmonary artery systolic pressure.  3. The mitral valve is normal in structure. Mild mitral valve regurgitation. No evidence of mitral stenosis.  4. The aortic valve is tricuspid. Aortic valve regurgitation is not visualized. No aortic stenosis is present.  5. The inferior vena cava is dilated in size with <50% respiratory variability, suggesting right atrial pressure of 15 mmHg. FINDINGS  Left Ventricle: Septal dyskinesis consistent with RBBB. Left ventricular ejection fraction, by estimation, is 40 to 45%. The left ventricle has mildly decreased function. The left ventricle has no regional wall motion abnormalities. The left ventricular  internal cavity size was normal in size. There is no left ventricular hypertrophy. Left ventricular diastolic parameters are consistent with Grade II diastolic dysfunction (pseudonormalization). Elevated left ventricular end-diastolic pressure. Right Ventricle: The right ventricular size is normal. No increase in right ventricular wall thickness. Right ventricular systolic function is moderately reduced. There is mildly elevated pulmonary artery systolic pressure. The tricuspid regurgitant velocity is 2.70 m/s, and with an assumed right atrial pressure of 15 mmHg, the estimated right ventricular systolic pressure is 14.7 mmHg. Left Atrium: Left atrial size was normal in size. Right Atrium: Right atrial size was normal in size. Pericardium: There is no evidence of pericardial effusion. Mitral Valve: The mitral valve is  normal in structure. Normal mobility of the mitral valve leaflets. Moderate mitral annular calcification. Mild mitral valve regurgitation. No evidence of mitral valve stenosis. Tricuspid Valve: The tricuspid valve is normal in structure. Tricuspid valve regurgitation is mild . No evidence of tricuspid stenosis. Aortic Valve: The aortic valve is tricuspid. Aortic valve regurgitation is not visualized. No aortic stenosis is present. Pulmonic Valve: The pulmonic valve was normal in structure. Pulmonic valve regurgitation is not visualized. No evidence of pulmonic stenosis. Aorta: The aortic root is normal in size and structure. Venous: The inferior vena cava is dilated in size with less than 50% respiratory variability, suggesting right atrial pressure of 15 mmHg. IAS/Shunts: There is left bowing of the interatrial septum, suggestive of elevated right atrial pressure. No atrial level shunt detected by color flow Doppler.  LEFT VENTRICLE PLAX 2D LVIDd:         5.00 cm      Diastology LVIDs:         3.90 cm      LV e' lateral:   5.59 cm/s LV PW:         1.00 cm      LV E/e' lateral: 25.2 LV IVS:        1.00 cm      LV e' medial:    3.73 cm/s LVOT diam:     1.90 cm      LV E/e' medial:  37.8 LV SV:         51 LV SV Index:   23 LVOT Area:     2.84 cm  LV Volumes (MOD) LV vol d, MOD A2C: 139.0 ml LV vol d, MOD A4C: 140.0 ml LV vol s, MOD A2C: 78.4 ml LV vol s, MOD A4C: 94.8 ml LV SV MOD A2C:     60.6 ml LV SV MOD A4C:     140.0 ml LV SV MOD BP:      54.3 ml RIGHT  VENTRICLE RV S prime:     3.49 cm/s TAPSE (M-mode): 1.1 cm LEFT ATRIUM             Index       RIGHT ATRIUM           Index LA diam:        4.50 cm 2.06 cm/m  RA Area:     19.80 cm LA Vol (A2C):   62.3 ml 28.47 ml/m RA Volume:   57.70 ml  26.37 ml/m LA Vol (A4C):   47.6 ml 21.76 ml/m LA Biplane Vol: 57.8 ml 26.42 ml/m  AORTIC VALVE LVOT Vmax:   98.60 cm/s LVOT Vmean:  65.400 cm/s LVOT VTI:    0.180 m  AORTA Ao Root diam: 2.90 cm MITRAL VALVE                 TRICUSPID VALVE MV Area (PHT): 4.57 cm     TR Peak grad:   29.2 mmHg MV Decel Time: 166 msec     TR Mean grad:   20.0 mmHg MR Peak grad: 110.7 mmHg    TR Vmax:        270.00 cm/s MR Mean grad: 70.0 mmHg     TR Vmean:       209.0 cm/s MR Vmax:      526.00 cm/s MR Vmean:     389.0 cm/s    SHUNTS MV E velocity: 141.00 cm/s  Systemic VTI:  0.18 m MV A velocity: 72.80 cm/s   Systemic Diam: 1.90 cm MV E/A ratio:  1.94 Skeet Latch MD Electronically signed by Skeet Latch MD Signature Date/Time: 06/27/2019/3:12:26 PM    Final     Pending Labs Unresulted Labs (From admission, onward) Comment          Start     Ordered   06/28/19 0500  Comprehensive metabolic panel  Daily,   R      06/27/19 1151   06/28/19 0500  Magnesium  Daily,   R      06/27/19 1151   06/28/19 0500  Phosphorus  Daily,   R      06/27/19 1151   06/28/19 0500  CBC WITH DIFFERENTIAL  Daily,   R      06/27/19 1151   06/27/19 2100  Blood gas, arterial  Once,   R        06/27/19 1723   06/27/19 1130  MRSA PCR Screening  ONCE - STAT,   STAT        06/27/19 1130   06/27/19 0541  Blood culture (routine x 2)  BLOOD CULTURE X 2,   STAT      06/27/19 0540   06/27/19 0537  Urine culture  ONCE - STAT,   STAT        06/27/19 0540          Vitals/Pain Today's Vitals   06/27/19 1735 06/27/19 1900 06/27/19 1902 06/27/19 1922  BP: 108/70 118/60  118/60  Pulse:  83  80  Resp:  12  (!) 23  Temp:      TempSrc:      SpO2:  100%  100%  Weight:      Height:      PainSc:   Asleep     Isolation Precautions No active isolations  Medications Medications  furosemide (LASIX) injection 60 mg (60 mg Intravenous Given 06/27/19 1103)  heparin injection 5,000 Units (5,000 Units Subcutaneous Given 06/27/19 1458)  acetaminophen (TYLENOL) tablet 650  mg (has no administration in time range)    Or  acetaminophen (TYLENOL) suppository 650 mg (has no administration in time range)  vancomycin (VANCOREADY) IVPB 1250 mg/250 mL (has no  administration in time range)  ceFEPIme (MAXIPIME) 2 g in sodium chloride 0.9 % 100 mL IVPB (0 g Intravenous Stopped 06/27/19 1743)  LORazepam (ATIVAN) injection 2-4 mg (has no administration in time range)  furosemide (LASIX) injection 40 mg (40 mg Intravenous Given 06/27/19 0905)  vancomycin (VANCOREADY) IVPB 2000 mg/400 mL (0 mg Intravenous Stopped 06/27/19 1743)  LORazepam (ATIVAN) injection 2 mg (2 mg Intravenous Given 06/27/19 1730)    Mobility non-ambulatory

## 2019-06-27 NOTE — ED Triage Notes (Signed)
Pt presents via [facility] for evaluation of increasing shortness of breath with diminished lung sounds on right. EMS reports that pt was from [facility] and staff advised that O2 saturation had dropped. EMS advised pt was 94% on 4L.

## 2019-06-27 NOTE — ED Notes (Signed)
Pt transported to MRI 

## 2019-06-27 NOTE — Progress Notes (Signed)
Pharmacy Antibiotic Note  [name] is a 64 y.o. female admitted on 06/27/2019 with osteomyelitis.  Pharmacy has been consulted for vancomycin and cefepime dosing.  Plan:  Cefepime 2 gr IV q8h   Vancomycin 2000 mg IV x1, then vancomycin 1250 mg IV q24h   Monitor clinical course, renal function, cultures as available   Height: 5' 5"  (165.1 cm) Weight: 115 kg (253 lb 8.5 oz) IBW/kg (Calculated) : 57  Temp (24hrs), Avg:99.5 F (37.5 C), Min:99.1 F (37.3 C), Max:99.8 F (37.7 C)  Recent Labs  Lab 06/27/19 0536 06/27/19 0541 06/27/19 1044  WBC 11.0*  --   --   CREATININE 2.40*  --  2.25*  LATICACIDVEN  --  1.5 1.8    Estimated Creatinine Clearance: 32 mL/min (A) (by C-G formula based on SCr of 2.25 mg/dL (H)).    Allergies  Allergen Reactions  . Latex Anaphylaxis and Rash  . Gabapentin Nausea And Vomiting and Other (See Comments)    Antimicrobials this admission: 6/10 vancomycin >>  6/10 cefepime >>   Dose adjustments this admission:    Microbiology results: 6/10 BCx:  6/10 UCx:   6/10 MRSA PCR:   Thank you for allowing pharmacy to be a part of this patient's care.   Royetta Asal, PharmD, BCPS 06/27/2019 12:20 PM

## 2019-06-27 NOTE — Consult Note (Signed)
Hospital Consult    Reason for Consult: Bilateral lower extremity wounds Referring Physician:  Dr. Sherral Hammers MRN #:  [id number]  History of Present Illness: This is a 64 y.o. female with history of congestive heart failure, COPD, diabetes and hepatitis currently a resident of [facility] home.  According to the chart patient has been confused for a few days has not been speaking and is normally conversant.  They were unable to contact the husband she was sent to the emergency department.  I am consulted for evaluation of wounds on her bilateral feet with draining wounds on the right and left heel ulcer with previous left great toe amputation.  Patient has previous vascular surgical intervention of the left SFA and popliteal arteries 1 year ago yesterday at [facility].  I do not have any other history of vascular intervention.  Patient is not conversant currently on BiPAP there is no family immediately available and her husband is not answering the phone.  Past Medical History:  Diagnosis Date  . Cervical cancer ([place])   . CHF (congestive heart failure) ([place])   . Chronic anemia   . COPD (chronic obstructive pulmonary disease) ([place])   . Diabetes mellitus without complication ([place])   . Gastric ulcer   . NSTEMI (non-ST elevated myocardial infarction) ([place])   . Upper GI bleed     Past Surgical History:  Procedure Laterality Date  . AMPUTATION Left 06/24/2018   Procedure: AMPUTATION GREAT TOE;  Surgeon: Samara Deist, DPM;  Location: [facility];  Service: Podiatry;  Laterality: Left;  . APPENDECTOMY    . BIOPSY  11/22/2018   Procedure: BIOPSY;  Surgeon: Thornton Park, MD;  Location: [place];  Service: Gastroenterology;;  . CARDIAC CATHETERIZATION    . CHOLECYSTECTOMY    . COLONOSCOPY N/A 09/28/2017   Procedure: COLONOSCOPY;  Surgeon: Toledo, Benay Pike, MD;  Location: [facility] ENDOSCOPY;  Service: Gastroenterology;  Laterality: N/A;  . COLONOSCOPY WITH PROPOFOL N/A 08/21/2018    Procedure: COLONOSCOPY WITH PROPOFOL;  Surgeon: Lin Landsman, MD;  Location: [facility] ENDOSCOPY;  Service: Gastroenterology;  Laterality: N/A;  . COLONOSCOPY WITH PROPOFOL N/A 12/28/2018   Procedure: COLONOSCOPY WITH PROPOFOL;  Surgeon: Rush Landmark Telford Nab., MD;  Location: [facility] ENDOSCOPY;  Service: Gastroenterology;  Laterality: N/A;  . ENTEROSCOPY Left 11/06/2017   Procedure: ENTEROSCOPY;  Surgeon: Jonathon Bellows, MD;  Location: [facility] ENDOSCOPY;  Service: Gastroenterology;  Laterality: Left;  . ENTEROSCOPY N/A 06/22/2018   Procedure: ENTEROSCOPY;  Surgeon: Lin Landsman, MD;  Location: [facility] ENDOSCOPY;  Service: Gastroenterology;  Laterality: N/A;  . ENTEROSCOPY N/A 08/18/2018   Procedure: ENTEROSCOPY;  Surgeon: Jonathon Bellows, MD;  Location: [facility]COPY;  Service: Gastroenterology;  Laterality: N/A;  . ENTEROSCOPY N/A 11/01/2018   Procedure: ENTEROSCOPY;  Surgeon: Virgel Manifold, MD;  Location: [facility] ENDOSCOPY;  Service: Endoscopy;  Laterality: N/A;  . ENTEROSCOPY N/A 11/22/2018   Procedure: ENTEROSCOPY;  Surgeon: Thornton Park, MD;  Location: [place];  Service: Gastroenterology;  Laterality: N/A;  . ENTEROSCOPY Left 12/17/2018   Procedure: ENTEROSCOPY;  Surgeon: Yetta Flock, MD;  Location: [facility];  Service: Gastroenterology;  Laterality: Left;  . ENTEROSCOPY N/A 12/19/2018   Procedure: ENTEROSCOPY;  Surgeon: Yetta Flock, MD;  Location: [facility];  Service: Gastroenterology;  Laterality: N/A;  . ENTEROSCOPY N/A 12/20/2018   Procedure: ENTEROSCOPY;  Surgeon: Lavena Bullion, DO;  Location: [facility];  Service: Gastroenterology;  Laterality: N/A;  . ESOPHAGOGASTRODUODENOSCOPY N/A 12/19/2016   Procedure: ESOPHAGOGASTRODUODENOSCOPY (EGD);  Surgeon: Lin Landsman, MD;  Location: [facility] ENDOSCOPY;  Service: Gastroenterology;  Laterality: N/A;  . ESOPHAGOGASTRODUODENOSCOPY N/A 09/28/2017   Procedure: ESOPHAGOGASTRODUODENOSCOPY (EGD);  Surgeon: Toledo,  Benay Pike, MD;  Location: [facility] ENDOSCOPY;  Service: Gastroenterology;  Laterality: N/A;  . ESOPHAGOGASTRODUODENOSCOPY (EGD) WITH PROPOFOL N/A 08/08/2017   Procedure: ESOPHAGOGASTRODUODENOSCOPY (EGD) WITH PROPOFOL;  Surgeon: [name], MD;  Location: [facility] ENDOSCOPY;  Service: Endoscopy;  Laterality: N/A;  . GIVENS CAPSULE STUDY N/A 10/28/2018   Procedure: GIVENS CAPSULE STUDY;  Surgeon: Jonathon Bellows, MD;  Location: [facility] ENDOSCOPY;  Service: Gastroenterology;  Laterality: N/A;  . GIVENS CAPSULE STUDY N/A 12/26/2018   Procedure: GIVENS CAPSULE STUDY;  Surgeon: Rush Landmark Telford Nab., MD;  Location: [facility];  Service: Gastroenterology;  Laterality: N/A;  . [place]    . HEMOSTASIS CLIP PLACEMENT  12/19/2018   Procedure: HEMOSTASIS CLIP PLACEMENT;  Surgeon: Yetta Flock, MD;  Location: [facility];  Service: Gastroenterology;;  . HEMOSTASIS CLIP PLACEMENT  12/28/2018   Procedure: HEMOSTASIS CLIP PLACEMENT;  Surgeon: Irving Copas., MD;  Location: [facility];  Service: Gastroenterology;;  . HOT HEMOSTASIS N/A 12/17/2018   Procedure: HOT HEMOSTASIS (ARGON PLASMA COAGULATION/BICAP);  Surgeon: Yetta Flock, MD;  Location: [facility];  Service: Gastroenterology;  Laterality: N/A;  . HOT HEMOSTASIS N/A 12/28/2018   Procedure: HOT HEMOSTASIS (ARGON PLASMA COAGULATION/BICAP);  Surgeon: Irving Copas., MD;  Location: [facility];  Service: Gastroenterology;  Laterality: N/A;  . LEFT HEART CATH AND CORONARY ANGIOGRAPHY N/A 12/23/2016   Procedure: LEFT HEART CATH AND CORONARY ANGIOGRAPHY;  Surgeon: Teodoro Spray, MD;  Location: [place] CV LAB;  Service: Cardiovascular;  Laterality: N/A;  . LOWER EXTREMITY ANGIOGRAPHY Left 06/26/2018   Procedure: Lower Extremity Angiography;  Surgeon: Katha Cabal, MD;  Location: [place] [facility];  Service: Cardiovascular;  Laterality: Left;  . SUBMUCOSAL INJECTION  12/19/2018   Procedure:  SUBMUCOSAL INJECTION;  Surgeon: Yetta Flock, MD;  Location: [facility];  Service: Gastroenterology;;  . Lia Foyer TATTOO INJECTION  12/17/2018   Procedure: SUBMUCOSAL TATTOO INJECTION;  Surgeon: Yetta Flock, MD;  [facility];  Service: Gastroenterology;;  . Lia Foyer TATTOO INJECTION  12/28/2018   Procedure: SUBMUCOSAL TATTOO INJECTION;  Surgeon: Irving Copas., MD;  Location: [facility];  Service: Gastroenterology;;  . TEE WITHOUT CARDIOVERSION N/A 10/29/2018   Procedure: TRANSESOPHAGEAL ECHOCARDIOGRAM (TEE);  Surgeon: Teodoro Spray, MD;  Location: [facility];  Service: Cardiovascular;  Laterality: N/A;  . TEE WITHOUT CARDIOVERSION N/A 05/30/2019   Procedure: TRANSESOPHAGEAL ECHOCARDIOGRAM (TEE);  Surgeon: Teodoro Spray, MD;  Location: [facility];  Service: Cardiovascular;  Laterality: N/A;    Allergies  Allergen Reactions  . Latex Anaphylaxis and Rash  . Gabapentin Nausea And Vomiting and Other (See Comments)    Prior to Admission medications   Medication Sig Start Date End Date Taking? Authorizing Provider  Amino Acids-Protein Hydrolys (FEEDING SUPPLEMENT, PRO-STAT SUGAR FREE 64,) LIQD Take 30 mLs by mouth 2 (two) times daily. Patient taking differently: Take 30 mLs by mouth 2 (two) times daily with a meal.  12/07/18  Yes Al Decant, MD  atorvastatin (LIPITOR) 40 MG tablet Take 40 mg by mouth daily.   Yes [provider]  dronabinol (MARINOL) 2.5 MG capsule Take 1 capsule (2.5 mg total) by mouth 2 (two) times daily before lunch and supper. 06/05/19  Yes Lavina Hamman, MD  ferrous sulfate 325 (65 FE) MG tablet Take 1 tablet (325 mg total) by mouth 2 (two) times daily with a meal.  05/10/19  Yes Fritzi Mandes, MD  fluticasone (FLONASE) 50 MCG/ACT nasal spray Place 2 sprays into both nostrils daily.   Yes [provider]  folic acid (FOLVITE) 1 MG tablet Take 1 tablet (1 mg total) by mouth daily. 08/21/18  Yes Ojie, Jude, MD    furosemide (LASIX) 20 MG tablet Take 1 tablet (20 mg total) by mouth daily. 06/06/19  Yes Lavina Hamman, MD  hydrocerin (EUCERIN) CREA Apply 1 application topically 2 (two) times daily. 06/05/19  Yes Lavina Hamman, MD  HYDROcodone-acetaminophen (NORCO/VICODIN) 5-325 MG tablet Take 1 tablet by mouth every 6 (six) hours as needed for moderate pain.   Yes [provider]  lactulose (CHRONULAC) 10 GM/15ML solution Take 30 mLs (20 g total) by mouth 3 (three) times daily. 12/07/18  Yes Al Decant, MD  mirtazapine (REMERON) 30 MG tablet Take 30 mg by mouth at bedtime. 03/29/19  Yes [provider]  Multiple Vitamins-Minerals (CERTAVITE SENIOR PO) Take 1 tablet by mouth daily.   Yes [provider]  Nutritional Supplements (RESOURCE 2.0 PO) Take 1 Bottle by mouth 2 (two) times daily.   Yes [provider]  pregabalin (LYRICA) 75 MG capsule Take 1 capsule (75 mg total) by mouth 2 (two) times daily. 06/05/19  Yes Lavina Hamman, MD  collagenase (SANTYL) ointment Apply topically daily. Patient not taking: Reported on 06/27/2019 05/10/19   Fritzi Mandes, MD  Multiple Vitamin (MULTIVITAMIN WITH MINERALS) TABS tablet Take 1 tablet by mouth daily. Patient not taking: Reported on 06/27/2019 06/28/18   Lang Snow, NP    Social History   Socioeconomic History  . Marital status: Married    Spouse name: Not on file  . Number of children: Not on file  . Years of education: Not on file  . Highest education level: Not on file  Occupational History  . Not on file  Tobacco Use  . Smoking status: Current Some Day Smoker    Packs/day: 0.25    Types: Cigarettes  . Smokeless tobacco: Never Used  Vaping Use  . Vaping Use: Never used  Substance and Sexual Activity  . Alcohol use: No  . Drug use: No  . Sexual activity: Not on file  Other Topics Concern  . Not on file  Social History Narrative  . Not on file   Social Determinants of Health   Financial  Resource Strain:   . Difficulty of Paying Living Expenses:   Food Insecurity:   . Worried About Charity fundraiser in the Last Year:   . Arboriculturist in the Last Year:   Transportation Needs:   . Film/video editor (Medical):   [name] Kitchen Lack of Transportation (Non-Medical):   Physical Activity:   . Days of Exercise per Week:   . Minutes of Exercise per Session:   Stress:   . Feeling of Stress :   Social Connections:   . Frequency of Communication with Friends and Family:   . Frequency of Social Gatherings with Friends and Family:   . Attends Religious Services:   . Active Member of Clubs or Organizations:   . Attends Archivist Meetings:   [name] Kitchen Marital Status:   Intimate Partner Violence:   . Fear of Current or Ex-Partner:   . Emotionally Abused:   [name] Kitchen Physically Abused:   . Sexually Abused:     Family History  Problem Relation Age of Onset  . Hypertension Mother   . Diabetes Mellitus II Mother   .  Breast cancer Mother   . Heart disease Father   . Cancer Sister     ROS:  Cannot review due to patient's mental status  Physical Examination  Vitals:   06/27/19 1244 06/27/19 1444  BP:  112/64  Pulse: 85 79  Resp: 15 17  Temp:    SpO2: 100% 100%   Body mass index is 42.19 kg/m.  General: She is not conversant at this time HENT: WNL, normocephalic Pulmonary: Currently on BiPAP Cardiac: I cannot palpate any femoral pulses somewhat due to patient size and her moving with exam.  I do not readily palpate any popliteal pulses there are no tibial pulses palpable Abdomen: soft, NT/ND, no masses Extremities: There is extensive swelling of the right foot with wounds on the medial and lateral aspect as well as on the heel. Left foot with punctate heel ulceration and well-healed left great toe amputation  CBC    Component Value Date/Time   WBC 11.0 (H) 06/27/2019 0536   RBC 3.96 06/27/2019 0536   HGB 8.8 (L) 06/27/2019 0536   HGB 7.3 (L) 11/23/2017 1454   HCT  33.7 (L) 06/27/2019 0536   HCT 45.7 08/03/2013 2138   PLT 181 06/27/2019 0536   PLT 118 (L) 08/03/2013 2138   MCV 85.1 06/27/2019 0536   MCV 89 08/03/2013 2138   MCH 22.2 (L) 06/27/2019 0536   MCHC 26.1 (L) 06/27/2019 0536   RDW 26.4 (H) 06/27/2019 0536   RDW 13.6 08/03/2013 2138   LYMPHSABS 0.9 06/27/2019 0536   LYMPHSABS 3.4 08/03/2013 2138   MONOABS 1.1 (H) 06/27/2019 0536   MONOABS 0.7 08/03/2013 2138   EOSABS 0.1 06/27/2019 0536   EOSABS 0.3 08/03/2013 2138   BASOSABS 0.1 06/27/2019 0536   BASOSABS 0.1 08/03/2013 2138    BMET    Component Value Date/Time   NA 143 06/27/2019 1044   NA 139 08/03/2013 2138   K 5.5 (H) 06/27/2019 1044   K 3.4 (L) 08/03/2013 2138   CL 111 06/27/2019 1044   CL 104 08/03/2013 2138   CO2 22 06/27/2019 1044   CO2 27 08/03/2013 2138   GLUCOSE 124 (H) 06/27/2019 1044   GLUCOSE 153 (H) 08/03/2013 2138   BUN 38 (H) 06/27/2019 1044   BUN 16 08/03/2013 2138   CREATININE 2.25 (H) 06/27/2019 1044   CREATININE 1.19 08/03/2013 2138   CALCIUM 7.7 (L) 06/27/2019 1044   CALCIUM 9.2 08/03/2013 2138   GFRNONAA 22 (L) 06/27/2019 1044   GFRNONAA 50 (L) 08/03/2013 2138   GFRAA 26 (L) 06/27/2019 1044   GFRAA 58 (L) 08/03/2013 2138    COAGS: Lab Results  Component Value Date   INR 1.6 (H) 05/30/2019   INR 1.7 (H) 05/27/2019   INR 1.2 05/07/2019     Non-Invasive Vascular Imaging:   None performed  ASSESSMENT/PLAN: This is a 64 y.o. female with bilateral lower extremity ulceration right greater than left.  It appears that this patient is nonambulatory given her bilateral heel ulceration but I was unable to confirm this with the patient and no family member was present and husband did not answer the phone.  Wounds on the right foot appear extensive she is likely going to need above-knee amputation on the right.  I am unsure of the etiology of her mental status changes could be secondary to infection of the right lower extremity although this does appear  to be relatively chronic in nature she does not have any significant leukocytosis or fever at this time.  Left lower extremity has heel ulceration although is punctate may have to focus on salvaging this after we get her through treatment of the right lower extremity.  Patient will need transfer to [facility] to consider the above-noted procedures including right above-knee amputation possible left lower extremity angiography.  I will attempt to contact the husband again tomorrow.  Sheri Gatchel C. Donzetta Matters, MD [facility] Office: [phone] Pager: [phone]

## 2019-06-27 NOTE — ED Provider Notes (Signed)
TIME SEEN: 5:47 AM  CHIEF COMPLAINT: AMS, hypoxia  HPI: Patient is a 64 year old female with history of CHF, COPD, NSTEMI, diabetes, gastric ulcer, hepatitis who presents to the emergency department from [place] facility with EMS for concern for altered mental status.  History is provided by patient's nurse at the nursing home, [facility].  She states that the patient remained has been complaining of the patient "talking out of her head" for 3 days.  She states last night when she went to evaluate her at the beginning of her shift around 7 PM she states the patient was not talking normally and was moaning and grunting.  States she was able to answer yes and no at this time.  She normally is able to carry a conversation and is oriented.  She states that she noticed the patient's oxygen saturation was 58% on room air.  She does not wear oxygen chronically.  Minimally tachycardic but otherwise vitals were normal.  On reevaluation this morning, patient's mental status appeared worse and she was not able to answer any questions at all.  Nurse attempted to call patient's husband without answer.  Sent her to the emergency department for further evaluation.  She states patient is bedbound at baseline.  She reports that patient completed course of Augmentin on June 8 for bilateral lower extremity cellulitis.  No other medication changes.  Per recent tele medicine note 06/18/19: "Was in [facility] between 05/26/19-06/06/19 for Group c strep bacteremia and b/l leg edema, anasarca, rt leg superficial wounds and cellulitis. She had anasarca, CKD, diabetes, encephalopathy, lethargy. She has prosthetic aortic valve and underwent TEE on 05/30/19 and prosthetic valve endocarditis ruled out."  Husband states she is a full code.  Husband - [phone]  ROS: Level 5 caveat due to altered mental status   PAST MEDICAL HISTORY/PAST SURGICAL HISTORY:  Past Medical History:  Diagnosis Date  . Cervical cancer ([place])   .  CHF (congestive heart failure) ([place])   . Chronic anemia   . COPD (chronic obstructive pulmonary disease) ([place])   . Diabetes mellitus without complication ([place])   . Gastric ulcer   . NSTEMI (non-ST elevated myocardial infarction) ([place])   . Upper GI bleed     MEDICATIONS:  Prior to Admission medications   Medication Sig Start Date End Date Taking? Authorizing Provider  Amino Acids-Protein Hydrolys (FEEDING SUPPLEMENT, PRO-STAT SUGAR FREE 64,) LIQD Take 30 mLs by mouth 2 (two) times daily. 12/07/18   Al Decant, MD  ammonium lactate (AMLACTIN) 12 % cream Apply 1 g topically 2 (two) times daily.    [provider]  atorvastatin (LIPITOR) 40 MG tablet Take 40 mg by mouth daily.    [provider]  collagenase (SANTYL) ointment Apply topically daily. 05/10/19   Fritzi Mandes, MD  dronabinol (MARINOL) 2.5 MG capsule Take 1 capsule (2.5 mg total) by mouth 2 (two) times daily before lunch and supper. 06/05/19   Lavina Hamman, MD  ferrous sulfate 325 (65 FE) MG tablet Take 1 tablet (325 mg total) by mouth 2 (two) times daily with a meal. 05/10/19   Fritzi Mandes, MD  fluticasone (FLONASE) 50 MCG/ACT nasal spray Place 2 sprays into both nostrils daily.    [provider]  folic acid (FOLVITE) 1 MG tablet Take 1 tablet (1 mg total) by mouth daily. 08/21/18   Stark Jock Jude, MD  furosemide (LASIX) 20 MG tablet Take 1 tablet (20 mg total) by mouth daily. 06/06/19   Lavina Hamman, MD  hydrocerin (EUCERIN) CREA Apply 1 application topically 2 (two) times daily. 06/05/19   Lavina Hamman, MD  lactulose (CHRONULAC) 10 GM/15ML solution Take 30 mLs (20 g total) by mouth 3 (three) times daily. 12/07/18   Al Decant, MD  mirtazapine (REMERON) 30 MG tablet Take 30 mg by mouth at bedtime. 03/29/19   [provider]  Multiple Vitamin (MULTIVITAMIN WITH MINERALS) TABS tablet Take 1 tablet by mouth daily. 06/28/18   Lang Snow, NP  Nutritional Supplements (RESOURCE 2.0 PO)  Take 1 Bottle by mouth 2 (two) times daily.    [provider]  pregabalin (LYRICA) 75 MG capsule Take 1 capsule (75 mg total) by mouth 2 (two) times daily. 06/05/19   Lavina Hamman, MD    ALLERGIES:  Allergies  Allergen Reactions  . Latex Anaphylaxis and Rash  . Gabapentin Nausea And Vomiting and Other (See Comments)    SOCIAL HISTORY:  Social History   Tobacco Use  . Smoking status: Current Some Day Smoker    Packs/day: 0.25    Types: Cigarettes  . Smokeless tobacco: Never Used  Substance Use Topics  . Alcohol use: No    FAMILY HISTORY: Family History  Problem Relation Age of Onset  . Hypertension Mother   . Diabetes Mellitus II Mother   . Breast cancer Mother   . Heart disease Father   . Cancer Sister     EXAM: BP (!) 119/100 (BP Location: Right Arm)   Pulse 98   Temp 99.1 F (37.3 C) (Oral)   Resp 20   Ht 5' 5"  (1.651 m)   Wt 115 kg   SpO2 100%   BMI 42.19 kg/m  CONSTITUTIONAL: Alert and will open eyes intermittently and will moan and grunt but does not answer questions or follow commands.  Obese.  Chronically ill-appearing. HEAD: Normocephalic EYES: Conjunctivae clear, pupils appear equal, EOM appear intact ENT: normal nose; moist mucous membranes NECK: Supple, normal ROM CARD: RRR; S1 and S2 appreciated; no murmurs, no clicks, no rubs, no gallops RESP: Normal chest excursion without splinting or tachypnea; breath sounds clear and equal bilaterally; no wheezes, no rhonchi, no rales, no hypoxia or respiratory distress on 3 L nasal cannula ABD/GI: Normal bowel sounds; non-distended; soft, non-tender, no rebound, no guarding, no peritoneal signs, no hepatosplenomegaly BACK:  The back appears normal EXT: No deformity noted.  Chronic appearing edema in bilateral lower extremities without asymmetry.  She has signs of venous stasis dermatitis and multiple wounds to her bilateral lower extremities without purulent drainage, bleeding, surrounding redness or  warmth, extremities warm and well perfused but difficult to appreciate pulses due to chronic skin changes and edema, compartments soft SKIN: Normal color for age and race; warm; no rash on exposed skin NEURO: Appears to move her arms and legs minimally.  Moaning and grunting.  Does not answer questions or follow commands.  She does open her eyes spontaneously.   MEDICAL DECISION MAKING: Patient here with worsening mental status.  Hypoxic at her nursing home.  Differential includes infectious etiology, hepatic encephalopathy, stroke, intracranial hemorrhage, hypercarbia.  Will obtain labs, CT of the head, chest x-ray, urine, rectal temperature, cultures, blood glucose, EKG.  Anticipate patient will need admission to the hospital.  No family currently at bedside.  Spoke to husband by phone.  He reports that she didn't seem normal when he talked to her by phone yesterday.  He reports she is a full code.  ED PROGRESS: Labs show respiratory acidosis.  Will  start BiPAP.  Chest x-ray concerning for CHF.  Troponin, BNP pending.  Will start Lasix once I have results of her electrolytes today.  Rectal temp is 99.8.  CBG is 129.  7:35 AM  On reevaluation, patient is more alert and able to answer some questions.  She denies any pain.  She is improving on BiPAP.   8:05 AM  Labs show elevated BNP of almost 1200 and mildly elevated troponin.  She denies have any chest pain.  Minimal leukocytosis but no fever here.  Normal lactic.  Ammonia level is normal.  She has mild acute kidney injury superimposed on chronic kidney disease and her potassium today is 6.1 without EKG changes.  Will give 40 mg of IV Lasix and discuss with medicine for admission.  Left message for patient's husband.   I reviewed all nursing notes and pertinent previous records as available.  I have reviewed and interpreted any EKGs, lab and urine results, imaging (as available).   EKG Interpretation  Date/Time:  Thursday June 27 2019 06:48:17  EDT Ventricular Rate:  93 PR Interval:    QRS Duration: 138 QT Interval:  368 QTC Calculation: 458 R Axis:   -52 Text Interpretation: Sinus rhythm RBBB and LAFB No significant change since last tracing Confirmed by Heriberto Stmartin, Cyril Mourning [phone]) on 06/27/2019 6:55:48 AM        CRITICAL CARE Performed by: Cyril Mourning Rayshad Riviello   Total critical care time: 55 minutes  Critical care time was exclusive of separately billable procedures and treating other patients.  Critical care was necessary to treat or prevent imminent or life-threatening deterioration.  Critical care was time spent personally by me on the following activities: development of treatment plan with patient and/or surrogate as well as nursing, discussions with consultants, evaluation of patient's response to treatment, examination of patient, obtaining history from patient or surrogate, ordering and performing treatments and interventions, ordering and review of laboratory studies, ordering and review of radiographic studies, pulse oximetry and re-evaluation of patient's condition.   ABIMBOLA AKI was evaluated in Emergency Department on 06/27/2019 for the symptoms described in the history of present illness. She was evaluated in the context of the global COVID-19 pandemic, which necessitated consideration that the patient might be at risk for infection with the SARS-CoV-2 virus that causes COVID-19. Institutional protocols and algorithms that pertain to the evaluation of patients at risk for COVID-19 are in a state of rapid change based on information released by regulatory bodies including the CDC and federal and state organizations. These policies and algorithms were followed during the patient's care in the ED.      Kimiah Hibner, Delice Bison, DO 06/27/19 [phone]

## 2019-06-27 NOTE — ED Notes (Signed)
Pedal pulses present bilaterally with doppler.

## 2019-06-27 NOTE — Progress Notes (Signed)
Assisted with transporting PT to [facility] ED CT while on ventilator (NIC / PC) with 100% Fi02- uneventful.

## 2019-06-27 NOTE — Progress Notes (Signed)
Notified Lab that ABG being sent for analysis. 

## 2019-06-27 NOTE — Progress Notes (Signed)
Assisted with transporting PT from [facility] ED to [facility] MRI while on nonrebreather at 15 lpm (PT was removed from BiPAP just prior to transport- MD Sherral Hammers aware). PT did not and does not at this point seem to be in respiratory distress. Once arrived in MRI PT was removed from nonrebreather (Sp02 had remained at 100%) and placed on 4 lpm nasal cannula- after several minutes Sp02 remained at 100% and 02 was decreased to 3 LPM (PT is 02 dep at facility). On 3 lpm nasal cannula Sp02 98%, no respiratory distress noted, HR 82, RR 19. RT encouraged MRI staff to contact RT if support needed. Two 02 tanks are present for them to transport PT back to [facility] ED (both nonrebreather and nasal cannula are available).

## 2019-06-27 NOTE — H&P (Signed)
[facility] History and Physical  [name] LNL:[id number] DOB: 1955/05/13 DOA: 06/27/2019  Referring physician:  PCP: [place]   Chief Complaint: Acute metabolic encephalopathy/acute respiratory failure with hypoxia  HPI: [name] is a 64 y.o. BF PMHx AT, chronic Systolic CHF, NSTEMI, COPD,, DM type II uncontrolled with complication, DOE, OSA,, gastric ulcer, chronic hepatitis C without hepatic coma, other cirrhosis of liver CKD stage IIIb (baseline Cr 1.95) chronic back pain  who presents to the emergency department from [place] facility with EMS for concern for altered mental status.  History is provided by patient's nurse at the nursing home, [facility].  She states that the patient remained has been complaining of the patient "talking out of her head" for 3 days.  She states last night when she went to evaluate her at the beginning of her shift around 7 PM she states the patient was not talking normally and was moaning and grunting.  States she was able to answer yes and no at this time.  She normally is able to carry a conversation and is oriented.  She states that she noticed the patient's oxygen saturation was 58% on room air.  She does not wear oxygen chronically.  Minimally tachycardic but otherwise vitals were normal.  On reevaluation this morning, patient's mental status appeared worse and she was not able to answer any questions at all.  Nurse attempted to call patient's husband without answer.  Sent her to the emergency department for further evaluation.  She states patient is bedbound at baseline.  She reports that patient completed course of Augmentin on June 8 for bilateral lower extremity cellulitis.  No other medication changes.  Per recent tele medicine note 06/18/19: "Was in [facility] between 05/26/19-06/06/19 for Group c strep bacteremia and b/l leg edema, anasarca,rt leg superficial wounds and cellulitis. She had anasarca, CKD, diabetes,  encephalopathy, lethargy. She has prosthetic aortic valve and underwent TEE on 05/30/19 and prosthetic valve endocarditis ruled out."  Husband states she is a full code.  Husband - [phone]    Review of Systems:  Covid vaccination;  Constitutional:  No weight loss, night sweats, Fevers, chills, fatigue.  HEENT:  No headaches, Difficulty swallowing,Tooth/dental problems,Sore throat,  No sneezing, itching, ear ache, nasal congestion, post nasal drip,  Cardio-vascular:  No chest pain, Orthopnea, PND, swelling in lower extremities, anasarca, dizziness, palpitations  GI:  No heartburn, indigestion, abdominal pain, nausea, vomiting, diarrhea, change in bowel habits, loss of appetite  Resp:  No shortness of breath with exertion or at rest. No excess mucus, no productive cough, No non-productive cough, No coughing up of blood.No change in color of mucus.No wheezing.No chest wall deformity  Skin:  no rash or lesions.  GU:  no dysuria, change in color of urine, no urgency or frequency. No flank pain.  Musculoskeletal:  No joint pain or swelling. No decreased range of motion. No back pain.  Psych:  No change in mood or affect. No depression or anxiety. No memory loss.   Past Medical History:  Diagnosis Date  . Cervical cancer ([place])   . CHF (congestive heart failure) ([place])   . Chronic anemia   . COPD (chronic obstructive pulmonary disease) ([place])   . Diabetes mellitus without complication ([place])   . Gastric ulcer   . NSTEMI (non-ST elevated myocardial infarction) ([place])   . Upper GI bleed    Past Surgical History:  Procedure Laterality Date  . AMPUTATION Left 06/24/2018   Procedure: AMPUTATION GREAT  TOE;  Surgeon: Samara Deist, DPM;  Location: [facility];  Service: Podiatry;  Laterality: Left;  . APPENDECTOMY    . BIOPSY  11/22/2018   Procedure: BIOPSY;  Surgeon: Thornton Park, MD;  Location: [facility];  Service: Gastroenterology;;  . CARDIAC CATHETERIZATION    .  CHOLECYSTECTOMY    . COLONOSCOPY N/A 09/28/2017   Procedure: COLONOSCOPY;  Surgeon: Toledo, Benay Pike, MD;  Location: [facility] ENDOSCOPY;  Service: Gastroenterology;  Laterality: N/A;  . COLONOSCOPY WITH PROPOFOL N/A 08/21/2018   Procedure: COLONOSCOPY WITH PROPOFOL;  Surgeon: Lin Landsman, MD;  Location: [facility] ENDOSCOPY;  Service: Gastroenterology;  Laterality: N/A;  . COLONOSCOPY WITH PROPOFOL N/A 12/28/2018   Procedure: COLONOSCOPY WITH PROPOFOL;  Surgeon: Rush Landmark Telford Nab., MD;  Location: [facility] ENDOSCOPY;  Service: Gastroenterology;  Laterality: N/A;  . ENTEROSCOPY Left 11/06/2017   Procedure: ENTEROSCOPY;  Surgeon: Jonathon Bellows, MD;  Location: [facility] ENDOSCOPY;  Service: Gastroenterology;  Laterality: Left;  . ENTEROSCOPY N/A 06/22/2018   Procedure: ENTEROSCOPY;  Surgeon: Lin Landsman, MD;  Location: [facility] ENDOSCOPY;  Service: Gastroenterology;  Laterality: N/A;  . ENTEROSCOPY N/A 08/18/2018   Procedure: ENTEROSCOPY;  Surgeon: Jonathon Bellows, MD;  Location: [facility] ENDOSCOPY;  Service: Gastroenterology;  Laterality: N/A;  . ENTEROSCOPY N/A 11/01/2018   Procedure: ENTEROSCOPY;  Surgeon: Virgel Manifold, MD;  Location: [facility] ENDOSCOPY;  Service: Endoscopy;  Laterality: N/A;  . ENTEROSCOPY N/A 11/22/2018   Procedure: ENTEROSCOPY;  Surgeon: Thornton Park, MD;  Location: [place];  Service: Gastroenterology;  Laterality: N/A;  . ENTEROSCOPY Left 12/17/2018   Procedure: ENTEROSCOPY;  Surgeon: Yetta Flock, MD;  Location: [facility] ENDOSCOPY;  Service: Gastroenterology;  Laterality: Left;  . ENTEROSCOPY N/A 12/19/2018   Procedure: ENTEROSCOPY;  Surgeon: Yetta Flock, MD;  Location: [facility] ENDOSCOPY;  Service: Gastroenterology;  Laterality: N/A;  . ENTEROSCOPY N/A 12/20/2018   Procedure: ENTEROSCOPY;  Surgeon: Lavena Bullion, DO;  Location: [facility] ENDOSCOPY;  Service: Gastroenterology;  Laterality: N/A;  . ESOPHAGOGASTRODUODENOSCOPY N/A 12/19/2016   Procedure: ESOPHAGOGASTRODUODENOSCOPY  (EGD);  Surgeon: Lin Landsman, MD;  Location: [facility] ENDOSCOPY;  Service: Gastroenterology;  Laterality: N/A;  . ESOPHAGOGASTRODUODENOSCOPY N/A 09/28/2017   Procedure: ESOPHAGOGASTRODUODENOSCOPY (EGD);  Surgeon: Toledo, Benay Pike, MD;  Location: [facility] ENDOSCOPY;  Service: Gastroenterology;  Laterality: N/A;  . ESOPHAGOGASTRODUODENOSCOPY (EGD) WITH PROPOFOL N/A 08/08/2017   Procedure: ESOPHAGOGASTRODUODENOSCOPY (EGD) WITH PROPOFOL;  Surgeon: [name], MD;  Location: [facility] ENDOSCOPY;  Service: Endoscopy;  Laterality: N/A;  . GIVENS CAPSULE STUDY N/A 10/28/2018   Procedure: GIVENS CAPSULE STUDY;  Surgeon: Jonathon Bellows, MD;  Location: [facility] ENDOSCOPY;  Service: Gastroenterology;  Laterality: N/A;  . GIVENS CAPSULE STUDY N/A 12/26/2018   Procedure: GIVENS CAPSULE STUDY;  Surgeon: Rush Landmark Telford Nab., MD;  Location: [facility] ENDOSCOPY;  Service: Gastroenterology;  Laterality: N/A;  . [place]    . HEMOSTASIS CLIP PLACEMENT  12/19/2018   Procedure: HEMOSTASIS CLIP PLACEMENT;  Surgeon: Yetta Flock, MD;  Location: [facility];  Service: Gastroenterology;;  . HEMOSTASIS CLIP PLACEMENT  12/28/2018   Procedure: HEMOSTASIS CLIP PLACEMENT;  Surgeon: Irving Copas., MD;  Location: [facility] ENDOSCOPY;  Service: Gastroenterology;;  . HOT HEMOSTASIS N/A 12/17/2018   Procedure: HOT HEMOSTASIS (ARGON PLASMA COAGULATION/BICAP);  Surgeon: Yetta Flock, MD;  Location: [facility]OS[facility];  Service: Gastroenterology;  Laterality: N/A;  . HOT HEMOSTASIS N/A 12/28/2018   Procedure: HOT HEMOSTASIS (ARGON PLASMA COAGULATION/BICAP);  Surgeon: Irving Copas., MD;  Location: [facility];  Service: Gastroenterology;  Laterality: N/A;  . LEFT HEART CATH  AND CORONARY ANGIOGRAPHY N/A 12/23/2016   Procedure: LEFT HEART CATH AND CORONARY ANGIOGRAPHY;  Surgeon: Teodoro Spray, MD;  Location: [place] CV LAB;  Service: Cardiovascular;  Laterality: N/A;  . LOWER EXTREMITY ANGIOGRAPHY  Left 06/26/2018   Procedure: Lower Extremity Angiography;  Surgeon: Katha Cabal, MD;  Location: [place] CV LAB;  Service: Cardiovascular;  Laterality: Left;  . SUBMUCOSAL INJECTION  12/19/2018   Procedure: SUBMUCOSAL INJECTION;  Surgeon: Yetta Flock, MD;  Location: [facility];  Service: Gastroenterology;;  . Lia Foyer TATTOO INJECTION  12/17/2018   Procedure: SUBMUCOSAL TATTOO INJECTION;  Surgeon: Yetta Flock, MD;  Location: [facility] ENDOSCOPY;  Service: Gastroenterology;;  . Lia Foyer TATTOO INJECTION  12/28/2018   Procedure: SUBMUCOSAL TATTOO INJECTION;  Surgeon: Irving Copas., MD;  Location: [facility] ENDOSCOPY;  Service: Gastroenterology;;  . TEE WITHOUT CARDIOVERSION N/A 10/29/2018   Procedure: TRANSESOPHAGEAL ECHOCARDIOGRAM (TEE);  Surgeon: Teodoro Spray, MD;  Location: [facility] ORS;  Service: Cardiovascular;  Laterality: N/A;  . TEE WITHOUT CARDIOVERSION N/A 05/30/2019   Procedure: TRANSESOPHAGEAL ECHOCARDIOGRAM (TEE);  Surgeon: Teodoro Spray, MD;  Location: [facility] ORS;  Service: Cardiovascular;  Laterality: N/A;   Social History:  reports that she has been smoking cigarettes. She has been smoking about 0.25 packs per day. She has never used smokeless tobacco. She reports that she does not drink alcohol and does not use drugs.  Allergies  Allergen Reactions  . Latex Anaphylaxis and Rash  . Gabapentin Nausea And Vomiting and Other (See Comments)    Family History  Problem Relation Age of Onset  . Hypertension Mother   . Diabetes Mellitus II Mother   . Breast cancer Mother   . Heart disease Father   . Cancer Sister     Prior to Admission medications   Medication Sig Start Date End Date Taking? Authorizing Provider  Amino Acids-Protein Hydrolys (FEEDING SUPPLEMENT, PRO-STAT SUGAR FREE 64,) LIQD Take 30 mLs by mouth 2 (two) times daily. 12/07/18   Al Decant, MD  ammonium lactate (AMLACTIN) 12 % cream Apply 1 g topically 2 (two) times daily.     [provider]  atorvastatin (LIPITOR) 40 MG tablet Take 40 mg by mouth daily.    [provider]  collagenase (SANTYL) ointment Apply topically daily. 05/10/19   Fritzi Mandes, MD  dronabinol (MARINOL) 2.5 MG capsule Take 1 capsule (2.5 mg total) by mouth 2 (two) times daily before lunch and supper. 06/05/19   Lavina Hamman, MD  ferrous sulfate 325 (65 FE) MG tablet Take 1 tablet (325 mg total) by mouth 2 (two) times daily with a meal. 05/10/19   Fritzi Mandes, MD  fluticasone (FLONASE) 50 MCG/ACT nasal spray Place 2 sprays into both nostrils daily.    [provider]  folic acid (FOLVITE) 1 MG tablet Take 1 tablet (1 mg total) by mouth daily. 08/21/18   Stark Jock Jude, MD  furosemide (LASIX) 20 MG tablet Take 1 tablet (20 mg total) by mouth daily. 06/06/19   Lavina Hamman, MD  hydrocerin (EUCERIN) CREA Apply 1 application topically 2 (two) times daily. 06/05/19   Lavina Hamman, MD  lactulose (CHRONULAC) 10 GM/15ML solution Take 30 mLs (20 g total) by mouth 3 (three) times daily. 12/07/18   Al Decant, MD  mirtazapine (REMERON) 30 MG tablet Take 30 mg by mouth at bedtime. 03/29/19   [provider]  Multiple Vitamin (MULTIVITAMIN WITH MINERALS) TABS tablet Take 1 tablet by mouth daily. 06/28/18   Lang Snow,  NP  Nutritional Supplements (RESOURCE 2.0 PO) Take 1 Bottle by mouth 2 (two) times daily.    [provider]  pregabalin (LYRICA) 75 MG capsule Take 1 capsule (75 mg total) by mouth 2 (two) times daily. 06/05/19   Lavina Hamman, MD     Consultants:  Dr. Dione Plover, VVS    Procedures/Significant Events:  5/9 TEE;LEFT VENTRICLE: EF = 45%. No regional wall motion abnormalities. 6/10 CT head WO contrast;Old bilateral basal ganglia lacunar infarcts. -Atrophy, chronic microvascular disease. -No acute intracranial abnormality. 6/10 PCXR;Low volume chest with hazy opacity from pleural fluid and presumed atelectasis. Suspect  CHF   I have personally reviewed and interpreted all radiology studies and my findings are as above.   VENTILATOR SETTINGS: BiPAP 6/10 FiO2; 35% Rate; 10 SPO2; 100% Settings; 15/5    Cultures 6/10 MRSA by PCR pending   Antimicrobials: Anti-infectives (From admission, onward)   Start     Ordered Stop   06/28/19 1000  vancomycin (VANCOREADY) IVPB 1250 mg/250 mL     Discontinue     06/27/19 1223     06/27/19 1300  ceFEPIme (MAXIPIME) 2 g in sodium chloride 0.9 % 100 mL IVPB     Discontinue     06/27/19 1223     06/27/19 1230  vancomycin (VANCOREADY) IVPB 2000 mg/400 mL        06/27/19 1223 06/27/19 1540       Devices    LINES / TUBES:        Continuous Infusions:  Physical Exam: Vitals:   06/27/19 0802 06/27/19 0803 06/27/19 0804 06/27/19 0805  BP:      Pulse: 88  88 89  Resp: 16 19 17 20   Temp:      TempSrc:      SpO2: 100%  100% 100%  Weight:      Height:        Wt Readings from Last 3 Encounters:  06/27/19 115 kg  06/18/19 115.5 kg  06/13/19 115.2 kg    General: A/O x0, even to painful stimuli only moans does not open her eyes, positive acute respiratory distress Eyes: negative scleral hemorrhage, negative anisocoria, negative icterus ENT: Negative Runny nose, negative gingival bleeding, Neck:  Negative scars, masses, torticollis, lymphadenopathy, JVD Lungs: Clear to auscultation bilaterally without wheezes or crackles Cardiovascular: Regular rate and rhythm without murmur gallop or rub normal S1 and S2 Abdomen: negative abdominal pain, nondistended, positive soft, bowel sounds, no rebound, no ascites, no appreciable mass Extremities: Bilateral lower extremity acute on chronic venous stasis ulcers with pus clearly visible from the RIGHT ulcers.  See below Skin: See extremities Psychiatric: Unable to assess Central nervous system: With painful stimuli will move all and twitch hands and feet          Labs on Admission:  Basic Metabolic  Panel: Recent Labs  Lab 06/27/19 0536  NA 140  K 6.1*  CL 109  CO2 23  GLUCOSE 139*  BUN 39*  CREATININE 2.40*  CALCIUM 8.2*   Liver Function Tests: Recent Labs  Lab 06/27/19 0536  AST 30  ALT 16  ALKPHOS 218*  BILITOT 1.0  PROT 9.1*  ALBUMIN 2.1*   No results for input(s): LIPASE, AMYLASE in the last 168 hours. Recent Labs  Lab 06/27/19 0659  AMMONIA 35   CBC: Recent Labs  Lab 06/27/19 0536  WBC 11.0*  NEUTROABS 8.8*  HGB 8.8*  HCT 33.7*  MCV 85.1  PLT 181   Cardiac Enzymes: No  results for input(s): CKTOTAL, CKMB, CKMBINDEX, TROPONINI in the last 168 hours.  BNP (last 3 results) Recent Labs    05/04/19 1133 05/26/19 1457 06/27/19 0539  BNP 778.0* 827.0* 1,195.3*    ProBNP (last 3 results) No results for input(s): PROBNP in the last 8760 hours.  CBG: Recent Labs  Lab 06/27/19 0651  GLUCAP 129*    Radiological Exams on Admission: DG Chest Portable 1 View  Result Date: 06/27/2019 CLINICAL DATA:  Hypoxia. EXAM: PORTABLE CHEST 1 VIEW COMPARISON:  05/26/2019 FINDINGS: Cardiomegaly. 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 Diffuse hazy appearance of the lungs with pleural fluid and vascular congestion. No visible air leak. IMPRESSION: Low volume chest with hazy opacity from pleural fluid and presumed atelectasis. Suspect CHF. Electronically Signed   By: Monte Fantasia M.D.   On: 06/27/2019 06:09    EKG: Independently reviewed.   Assessment/Plan Active Problems:   NSTEMI (non-ST elevated myocardial infarction) (HCC)   COPD (chronic obstructive pulmonary disease) (HCC)   Anxiety   CAD (coronary artery disease)   Chronic back pain   Chronic hepatitis C without hepatic coma (HCC)   Diabetes mellitus type 2, uncontrolled (HCC)   Hypertension   Sleep apnea   Other cirrhosis of liver (HCC)   Type 2 diabetes mellitus with stage 3b chronic kidney disease, with long-term current use of insulin (HCC)   Chronic systolic CHF (congestive heart  failure) (HCC)   Venous stasis ulcer ([place])  Acute metabolic Encephalopathy -Per admission note patient oriented and able to carry on a conversation. -Unable to confirm this as no one present at bedside and husband did not answer phone. -Patient certainly has multiple medical reasons for encephalopathy; osteomyelitis, acute respiratory failure with hypoxia, metabolic derangements.  Correct underlying issues  Acute respiratory failure with hypoxia -Multifactorial -Continue with BiPAP -ABG pending 2100; determine if making any significant headway  Respiratory acidosis -See acute respiratory failure  Acute on Chronic systolic CHF -Strict in and out -Daily weight -Lasix IV 60 mg TID   Acute on CKD stage IIIb (baseline Cr 1.95)  -Hold nephrotoxic medication -Most likely secondary to acute CHF -We will diurese see CHF Recent Labs  Lab 06/27/19 0536 06/27/19 1044  CREATININE 2.40* 2.25*   Acute on chronic venous stasis ulcer/Osteomyelitis? -Wound care consult placed -Obtain MRI bilateral lower extremity pending -Start patient on empiric antibiotics per pharmacy consult -Discussed case with Dr. Dione Plover, VVS; who recommends RIGHT AKA, will attempt to salvage left foot/leg.   Code Status: Full (DVT Prophylaxis: Subcu heparin Family Communication: 6/9 attempted to call husband no answer, will attempt again in the A.m. Status is: Inpatient    Dispo: The patient [facility] from: [facility]              Anticipated d/c is to: [facility]              Anticipated d/c date is: 6/20              Patient currently unstable     Data Reviewed: Care during the described time interval was provided by me .  I have reviewed this patient's available data, including medical history, events of note, physical examination, and all test results as part of my evaluation.   The patient is critically ill with multiple organ systems failure and requires high complexity decision making for  assessment and support, frequent evaluation and titration of therapies, application of advanced monitoring technologies and extensive interpretation of multiple databases. Critical Care  Time devoted to patient care services described in this note  Time spent: 70 minutes   Koreen Lizaola, [facility] Pager [phone]

## 2019-06-27 NOTE — ED Notes (Signed)
RT called to place pt back on BiPAP

## 2019-06-28 ENCOUNTER — Inpatient Hospital Stay (HOSPITAL_COMMUNITY): Payer: Medicaid Other

## 2019-06-28 DIAGNOSIS — E1121 Type 2 diabetes mellitus with diabetic nephropathy: Secondary | ICD-10-CM

## 2019-06-28 DIAGNOSIS — I2510 Atherosclerotic heart disease of native coronary artery without angina pectoris: Secondary | ICD-10-CM

## 2019-06-28 DIAGNOSIS — I10 Essential (primary) hypertension: Secondary | ICD-10-CM

## 2019-06-28 DIAGNOSIS — F419 Anxiety disorder, unspecified: Secondary | ICD-10-CM

## 2019-06-28 DIAGNOSIS — Z794 Long term (current) use of insulin: Secondary | ICD-10-CM

## 2019-06-28 LAB — COMPREHENSIVE METABOLIC PANEL
ALT: 14 U/L (ref 0–44)
AST: 24 U/L (ref 15–41)
Albumin: 1.9 g/dL — ABNORMAL LOW (ref 3.5–5.0)
Alkaline Phosphatase: 167 U/L — ABNORMAL HIGH (ref 38–126)
Anion gap: 11 (ref 5–15)
BUN: 45 mg/dL — ABNORMAL HIGH (ref 8–23)
CO2: 22 mmol/L (ref 22–32)
Calcium: 8.1 mg/dL — ABNORMAL LOW (ref 8.9–10.3)
Chloride: 108 mmol/L (ref 98–111)
Creatinine, Ser: 2.41 mg/dL — ABNORMAL HIGH (ref 0.44–1.00)
GFR calc Af Amer: 24 mL/min — ABNORMAL LOW (ref >60)
GFR calc non Af Amer: 21 mL/min — ABNORMAL LOW (ref >60)
Glucose, Bld: 90 mg/dL (ref 70–99)
Potassium: 5.2 mmol/L — ABNORMAL HIGH (ref 3.5–5.1)
Sodium: 141 mmol/L (ref 135–145)
Total Bilirubin: 0.8 mg/dL (ref 0.3–1.2)
Total Protein: 8 g/dL (ref 6.5–8.1)

## 2019-06-28 LAB — CBC WITH DIFFERENTIAL/PLATELET
Abs Immature Granulocytes: 0.04 10*3/uL (ref 0.00–0.07)
Basophils Absolute: 0.1 10*3/uL (ref 0.0–0.1)
Basophils Relative: 1 %
Eosinophils Absolute: 0.2 10*3/uL (ref 0.0–0.5)
Eosinophils Relative: 3 %
HCT: 32.9 % — ABNORMAL LOW (ref 36.0–46.0)
Hemoglobin: 8.8 g/dL — ABNORMAL LOW (ref 12.0–15.0)
Immature Granulocytes: 1 %
Lymphocytes Relative: 9 %
Lymphs Abs: 0.7 10*3/uL (ref 0.7–4.0)
MCH: 22.4 pg — ABNORMAL LOW (ref 26.0–34.0)
MCHC: 26.7 g/dL — ABNORMAL LOW (ref 30.0–36.0)
MCV: 83.7 fL (ref 80.0–100.0)
Monocytes Absolute: 1.2 10*3/uL — ABNORMAL HIGH (ref 0.1–1.0)
Monocytes Relative: 14 %
Neutro Abs: 6.2 10*3/uL (ref 1.7–7.7)
Neutrophils Relative %: 72 %
Platelets: 155 10*3/uL (ref 150–400)
RBC: 3.93 MIL/uL (ref 3.87–5.11)
RDW: 26.4 % — ABNORMAL HIGH (ref 11.5–15.5)
WBC: 8.4 10*3/uL (ref 4.0–10.5)
nRBC: 0 % (ref 0.0–0.2)

## 2019-06-28 LAB — URINE CULTURE: Culture: NO GROWTH

## 2019-06-28 LAB — BLOOD GAS, ARTERIAL
Acid-base deficit: 2.1 mmol/L — ABNORMAL HIGH (ref 0.0–2.0)
Bicarbonate: 23.9 mmol/L (ref 20.0–28.0)
FIO2: 30
O2 Saturation: 96.8 %
Patient temperature: 97.5
pCO2 arterial: 49.3 mmHg — ABNORMAL HIGH (ref 32.0–48.0)
pH, Arterial: 7.302 — ABNORMAL LOW (ref 7.350–7.450)
pO2, Arterial: 90.9 mmHg (ref 83.0–108.0)

## 2019-06-28 LAB — PHOSPHORUS: Phosphorus: 4.6 mg/dL (ref 2.5–4.6)

## 2019-06-28 LAB — GLUCOSE, CAPILLARY
Glucose-Capillary: 100 mg/dL — ABNORMAL HIGH (ref 70–99)
Glucose-Capillary: 131 mg/dL — ABNORMAL HIGH (ref 70–99)
Glucose-Capillary: 96 mg/dL (ref 70–99)

## 2019-06-28 LAB — MAGNESIUM: Magnesium: 1.8 mg/dL (ref 1.7–2.4)

## 2019-06-28 MED ORDER — INSULIN ASPART 100 UNIT/ML ~~LOC~~ SOLN
0.0000 [IU] | Freq: Every day | SUBCUTANEOUS | Status: DC
  Administered 2019-07-04 – 2019-07-05 (×2): 0 [IU] via SUBCUTANEOUS

## 2019-06-28 MED ORDER — MIRTAZAPINE 30 MG PO TABS
30.0000 mg | ORAL_TABLET | Freq: Every day | ORAL | Status: DC
  Administered 2019-06-28 – 2019-07-04 (×6): 30 mg via ORAL
  Filled 2019-06-28: qty 1
  Filled 2019-06-28 (×6): qty 2

## 2019-06-28 MED ORDER — ATORVASTATIN CALCIUM 40 MG PO TABS
40.0000 mg | ORAL_TABLET | Freq: Every day | ORAL | Status: DC
  Administered 2019-06-28 – 2019-07-03 (×5): 40 mg via ORAL
  Filled 2019-06-28 (×5): qty 1

## 2019-06-28 MED ORDER — LACTULOSE 10 GM/15ML PO SOLN
20.0000 g | Freq: Three times a day (TID) | ORAL | Status: DC
  Administered 2019-06-28 – 2019-07-04 (×15): 20 g via ORAL
  Filled 2019-06-28 (×17): qty 30

## 2019-06-28 MED ORDER — INSULIN ASPART 100 UNIT/ML ~~LOC~~ SOLN
0.0000 [IU] | Freq: Three times a day (TID) | SUBCUTANEOUS | Status: DC
  Administered 2019-06-30: 3 [IU] via SUBCUTANEOUS
  Administered 2019-06-30 – 2019-07-06 (×4): 2 [IU] via SUBCUTANEOUS
  Administered 2019-07-08: 3 [IU] via SUBCUTANEOUS
  Administered 2019-07-08: 2 [IU] via SUBCUTANEOUS

## 2019-06-28 MED ORDER — FOLIC ACID 1 MG PO TABS
1.0000 mg | ORAL_TABLET | Freq: Every day | ORAL | Status: DC
  Administered 2019-06-28 – 2019-07-03 (×5): 1 mg via ORAL
  Filled 2019-06-28 (×6): qty 1

## 2019-06-28 NOTE — Progress Notes (Addendum)
Pharmacy Antibiotic Note  [name] is a 64 y.o. female with bilateral  LE wounds presented the ED on 06/27/2019 with drainage from sites. She was started on vancomycin and cefepime on admission for infection.  - s/p left great toe amputation - 6/10 right tibia MRI: no osteo noted - 6/10 left foot MRI: no osteo noted - 6/10 right foot MRI: no osteo noted; fluid collection with concern for abscess  Today, 06/28/2019: - day #2 abx - afeb, wbc wnl - scr 2.41 (crcl~26N)--> on lasix IV - 1/4 blood cx bottle with GPR-- Micro lab is not running BCID. - VVS team recom. [facility] for possible right AKA   Plan: - continue cefepime to 2gm IV q12h for now - continue vancomycin 1250 mg IV q24h - monitor renal function closely - f/u cultures  _______________________________________  Height: 5' 5"  (165.1 cm) Weight: 112.6 kg (248 lb 3.8 oz) IBW/kg (Calculated) : 57  Temp (24hrs), Avg:96.9 F (36.1 C), Min:96.1 F (35.6 C), Max:97.5 F (36.4 C)  Recent Labs  Lab 06/27/19 0536 06/27/19 0541 06/27/19 1044 06/27/19 1402 06/28/19 0446 06/28/19 0617  WBC 11.0*  --   --   --  8.4  --   CREATININE 2.40*  --  2.25*  --   --  2.41*  LATICACIDVEN  --  1.5 1.8 2.0*  --   --     Estimated Creatinine Clearance: 29.5 mL/min (A) (by C-G formula based on SCr of 2.41 mg/dL (H)).    Allergies  Allergen Reactions  . Latex Anaphylaxis and Rash  . Gabapentin Nausea And Vomiting and Other (See Comments)    Antimicrobials this admission:  6/10 vancomycin >>  6/10 cefepime >>   Microbiology results:  6/10 BCx x2: 1/4 with GPR 6/10 UCx:   6/10 MRSA PCR: neg  Thank you for allowing pharmacy to be a part of this patient's care.  Lynelle Doctor 06/28/2019 10:41 AM

## 2019-06-28 NOTE — Progress Notes (Signed)
   I was able to contact her husband to discuss her wounds. She has a decubitus ulcer along with her significant tissue loss on the right and heel ulcer on the left. Patient will transfer to [facility] we can further.  Unfortunately likely will require right lower extremity amputation.  Possibly we can salvage the left lower extremity.  I will rediscussed with her husband after evaluating a [facility].   Mylen Mangan C. Donzetta Matters, MD [facility] Office: [phone] Pager: [phone]

## 2019-06-28 NOTE — Progress Notes (Signed)
PHARMACY - PHYSICIAN COMMUNICATION CRITICAL VALUE ALERT - BLOOD CULTURE IDENTIFICATION (BCID)  TEONDRA NEWBURG is a 64 y.o. female with bilateral  LE wounds presented the ED on 06/27/2019 with drainage from sites.Right foot MRI performed on 6/10 showed no osteo noted, but had fluid collection with concern for abscess.  VVS team recom. To transfer to [facility] for possible right AKA. She was started on vancomycin and cefepime on admission for infection.  - 1 of 4 bcx collected on 6/10 with GPR. Micro lab does not perform BCID on GPR  Name of physician (or Provider) Contacted: Dr. Cordelia Poche  Current antibiotics: vancomycin and cefepime  Changes to prescribed antibiotics recommended:  - continue current abx regimen    Lynelle Doctor 06/28/2019  12:58 PM

## 2019-06-28 NOTE — Progress Notes (Signed)
MD . Hal Hope was made aware re: pt's LOC, MEWS yellow. Md ordered CBG, ABG. LOC remains same, responsive to pain , touch. MD called back and said that he will come and check the patient.

## 2019-06-28 NOTE — Progress Notes (Signed)
Patient arrived to unit via [facility], telemetry leads applied and verified with second verifier. Current vitals are as follow:   06/28/19 1845  Vitals  Temp 99.1 F (37.3 C)  Temp Source Oral  BP (!) 102/54  MAP (mmHg) 68  BP Location Left Arm  BP Method Automatic  Patient Position (if appropriate) Lying  Pulse Rate 86  Pulse Rate Source Monitor  Resp 16  Oxygen Therapy  SpO2 100 %  O2 Device Nasal Cannula  MEWS Score  MEWS Temp 0  MEWS Systolic 0  MEWS Pulse 0  MEWS RR 0  MEWS LOC 0  MEWS Score 0  MEWS Score Color Green   Patient alert and oriented to person and place, disoriented to time and situation; denies any complaints at this time.

## 2019-06-28 NOTE — Progress Notes (Signed)
PROGRESS NOTE    [name]  KKX:[id number] DOB: 1955/05/29 DOA: 06/27/2019 PCP: [facility]   Brief Narrative: [name] is a 64 y.o. female with a history of chronic Systolic CHF, NSTEMI, COPD, DM type II uncontrolled with complication, DOE, OSA, gastric ulcer, chronic hepatitis C, CKD stage IIIb, chronic back pain   Assessment & Plan:   Active Problems:   NSTEMI (non-ST elevated myocardial infarction) (HCC)   COPD (chronic obstructive pulmonary disease) (HCC)   Anxiety   CAD (coronary artery disease)   Chronic back pain   Chronic hepatitis C without hepatic coma (HCC)   Diabetes mellitus type 2, uncontrolled (HCC)   Hypertension   Sleep apnea   Other cirrhosis of liver (HCC)   Acute metabolic encephalopathy   Type 2 diabetes mellitus with stage 3b chronic kidney disease, with long-term current use of insulin (HCC)   Chronic systolic CHF (congestive heart failure) (HCC)   Venous stasis ulcer ([place])   Acute metabolic encephalopathy In setting of hypercapnia and hypoxia. Appears to have resolved with BiPAP. Was also on Lyrica as an outpatient with associated AKI, this could have contributed. Ammonia normal.  Acute respiratory failure with hypoxia and hypercapnia Acute respiratory acidosis In setting of acute heart failure. Improved with BiPAP and diuresis. Unsure if patient uses oxygen at baseline. Report is that she uses 2L at baseline; on chart review, no mention of chronic oxygen use. -Wean to room air  Acute on chronic venous stasis ulcer Right foot abscesses Left leg cellulitis MRI of bilateral feet/tib/fib significant for likely right foot abscesses/myositis/tenosynovitis and left foot likely osteitis (unlikely osteomyelitis) in addition to myositis and left leg cellulitis. No osteomyelitis on MRI. Vascular surgery consulted on admission with recommendations for right AKA and left angiography. Plan for patient to transfer to [facility]  for management and anticipated amputation. Vancomycin and Cefepime started on admission. Blood cultures obtained and are significant for 1/4 bottles positive with GNR. Concern these wounds are secondary to vascular insufficiency and poor wound healing. -Continue Vancomycin/Cefepime -Vascular surgery recommendations: as mentioned above  Acute on chronic systolic heart failure BNP of 1195 on admission. Initially treaded with Lasix IV. In/Out not documented. Weight down 2.3 kg from admission. Respiratory status improved in conjunction with treatment with BiPAP. Creatinine is worsening while on aggressive diuresis -Hold Lasix dosing today and repeat BMP in AM -Wean oxygen as mentioned above -Strict in/out, daily weights (ordered on admission)  Positive blood culture 1/4 bottles positive for GNR. Possibility this may be a contaminant -Follow blood cultures  AKI on CKD stage IIIb Baseline creatinine of 1.7-1.9. In setting of infection and complicated by aggressive diuresis for fluid overload from heart failure. BUN trending up. Complicated by cirrhosis. Lasix discontinued as mentioned above. -Repeat BMP in AM and restart home Lasix 20 mg if creatinine trending down  Hyperkalemia Potassium of 6.1 on admission. Lasix given and potassium trended down. In setting of AKI. Continues to improve. -Trend on BMP  Elevated alkaline phosphatase No abdominal pain. Trending down.  Hypoalbuminemia Likely complication of malnourishment in addition to history of cirrhosis. -Dietitian consult  History of hepatic encephalopathy Normal ammonia on admission. -Continue Lactulose  Anemia Chronic. Likely secondary to chronic disease. Most recent iron studies suggests possible combination of iron deficiency and chronic disease. Patient is on iron supplementation as an outpatient. Stable.  Chronic hepatitis C Cirrhosis On lasix as an outpatient. -Lasix per above problems  Diabetes mellitus, type 2 Not on  outpatient treatment.  Last hemoglobin A1C of 7.4%. Has had better control in the past, but not recently -SSI while inpatient -Will need tighter control of diabetes; can likely be managed with oral regimen  Pressure injury Right anterior foot, left heel, right medial foot. POA.  Morbid obesity Body mass index is 41.31 kg/m.   DVT prophylaxis: Heparin subq Code Status:   Code Status: Full Code Family Communication: Husband on telephone Disposition Plan: Transfer to [facility]. Patient is from SNF and will likely return pending management of infected foot with antibiotics/vascular surgery recommendations and management.   Consultants:   Vascular surgery  Procedures:   None  Antimicrobials:  Vancomycin  Cefepime    Subjective: No issues. Moaning but states that nothing is wrong.  Objective: Vitals:   06/28/19 0453 06/28/19 0500 06/28/19 0759 06/28/19 0805  BP: (!) 106/59  105/63 105/63  Pulse: 69  77 77  Resp: 18  20 18   Temp: (!) 96.4 F (35.8 C)  (!) 97.3 F (36.3 C)   TempSrc: Axillary  Oral   SpO2: 100%  100% 100%  Weight:  112.6 kg    Height:        Intake/Output Summary (Last 24 hours) at 06/28/2019 1226 Last data filed at 06/28/2019 0300 Gross per 24 hour  Intake 100 ml  Output --  Net 100 ml   Filed Weights   06/27/19 0507 06/28/19 0500  Weight: 115 kg 112.6 kg    Examination:  General exam: Appears calm and comfortable Respiratory system: Clear to auscultation. Respiratory effort normal. Cardiovascular system: S1 & S2 heard, RRR. No murmurs, rubs, gallops or clicks. Gastrointestinal system: Abdomen is nondistended, soft and nontender. No organomegaly or masses felt. Normal bowel sounds heard. Central nervous system: Alert and oriented to person. Musculoskeletal: No calf tenderness. Right leg wrapped in gauze which is clean. Left foot with great toe amputation. Dressing over heel are intact Skin: No cyanosis. No rashes Psychiatry:  Judgement and insight appear normal. Mood & affect appropriate.     Data Reviewed: I have personally reviewed following labs and imaging studies  CBC Lab Results  Component Value Date   WBC 8.4 06/28/2019   RBC 3.93 06/28/2019   HGB 8.8 (L) 06/28/2019   HCT 32.9 (L) 06/28/2019   MCV 83.7 06/28/2019   MCH 22.4 (L) 06/28/2019   PLT 155 06/28/2019   MCHC 26.7 (L) 06/28/2019   RDW 26.4 (H) 06/28/2019   LYMPHSABS 0.7 06/28/2019   MONOABS 1.2 (H) 06/28/2019   EOSABS 0.2 06/28/2019   BASOSABS 0.1 12/75/1700     Last metabolic panel Lab Results  Component Value Date   NA 141 06/28/2019   K 5.2 (H) 06/28/2019   CL 108 06/28/2019   CO2 22 06/28/2019   BUN 45 (H) 06/28/2019   CREATININE 2.41 (H) 06/28/2019   GLUCOSE 90 06/28/2019   GFRNONAA 21 (L) 06/28/2019   GFRAA 24 (L) 06/28/2019   CALCIUM 8.1 (L) 06/28/2019   PHOS 4.6 06/28/2019   PROT 8.0 06/28/2019   ALBUMIN 1.9 (L) 06/28/2019   LABGLOB 5.0 (H) 10/29/2018   AGRATIO 0.7 12/22/2016   BILITOT 0.8 06/28/2019   ALKPHOS 167 (H) 06/28/2019   AST 24 06/28/2019   ALT 14 06/28/2019   ANIONGAP 11 06/28/2019    CBG (last 3)  Recent Labs    06/27/19 0651 06/28/19 0027  GLUCAP 129* 96     GFR: Estimated Creatinine Clearance: 29.5 mL/min (A) (by C-G formula based on SCr of 2.41 mg/dL (H)).  Coagulation Profile: No results for input(s): INR, PROTIME in the last 168 hours.  Recent Results (from the past 240 hour(s))  Blood culture (routine x 2)     Status: None (Preliminary result)   Collection Time: 06/27/19  5:46 AM   Specimen: BLOOD LEFT HAND  Result Value Ref Range Status   Specimen Description   Final    BLOOD LEFT HAND Performed at [facility] [address], [place], [place] [address]    Special Requests   Final    BOTTLES DRAWN AEROBIC ONLY Blood Culture adequate volume Performed at [facility] [address], [place], [place] [address]    Culture  Setup Time    Final    GRAM POSITIVE RODS AEROBIC BOTTLE ONLY CRITICAL RESULT CALLED TO, READ BACK BY AND VERIFIED WITH: PHARMD L POINDEXTER [id number] AT 1016 BY CM    Culture   Final    NO GROWTH < 24 HOURS Performed at [facility], [place] [address], [place], [place] [address]    Report Status PENDING  Incomplete  SARS Coronavirus 2 by RT PCR (hospital order, performed in [facility] hospital lab) Nasopharyngeal Nasopharyngeal Swab     Status: None   Collection Time: 06/27/19  6:26 AM   Specimen: Nasopharyngeal Swab  Result Value Ref Range Status   SARS Coronavirus 2 NEGATIVE NEGATIVE Final    Comment: (NOTE) SARS-CoV-2 target nucleic acids are NOT DETECTED.  The SARS-CoV-2 RNA is generally detectable in upper and lower respiratory specimens during the acute phase of infection. The lowest concentration of SARS-CoV-2 viral copies this assay can detect is 250 copies / mL. A negative result does not preclude SARS-CoV-2 infection and should not be used as the sole basis for treatment or other patient management decisions.  A negative result may occur with improper specimen collection / handling, submission of specimen other than nasopharyngeal swab, presence of viral mutation(s) within the areas targeted by this assay, and inadequate number of viral copies (<250 copies / mL). A negative result must be combined with clinical observations, patient history, and epidemiological information.  Fact Sheet for Patients:   StrictlyIdeas.no  Fact Sheet for Healthcare Providers: BankingDealers.co.za  This test is not yet approved or  cleared by the [place] FDA and has been authorized for detection and/or diagnosis of SARS-CoV-2 by FDA under an Emergency Use Authorization (EUA).  This EUA will remain in effect (meaning this test can be used) for the duration of the COVID-19 declaration under Section 564(b)(1) of the Act, 21 U.S.C. section  360bbb-3(b)(1), unless the authorization is terminated or revoked sooner.  Performed at [facility], [place] [address], [place], [place] [address]   Blood culture (routine x 2)     Status: None (Preliminary result)   Collection Time: 06/27/19  6:30 AM   Specimen: BLOOD  Result Value Ref Range Status   Specimen Description   Final    BLOOD BLOOD RIGHT FOREARM Performed at [facility] [address], [place], [place] [address]    Special Requests   Final    BOTTLES DRAWN AEROBIC ONLY Blood Culture results may not be optimal due to an inadequate volume of blood received in culture bottles Performed at [facility] [address], [place], [place] [address]    Culture   Final    NO GROWTH < 24 HOURS Performed at [facility] [address], [place], [place] [address]  Report Status PENDING  Incomplete  Urine culture     Status: None   Collection Time: 06/27/19 12:42 PM   Specimen: Urine, Random  Result Value Ref Range Status   Specimen Description   Final    URINE, RANDOM Performed at [facility] [address] [place], [place] [address]    Special Requests   Final    NONE Performed at [facility], [place] [address], [place], [place] [address]    Culture   Final    NO GROWTH Performed at [facility], [place] [address], [place], [place] [address]    Report Status 06/28/2019 FINAL  Final        Radiology Studies: CT Head Wo Contrast  Result Date: 06/27/2019 CLINICAL DATA:  Altered mental status EXAM: CT HEAD WITHOUT CONTRAST TECHNIQUE: Contiguous axial images were obtained from the base of the skull through the vertex without intravenous contrast. COMPARISON:  11/20/2018 FINDINGS: Brain: There is atrophy and chronic small vessel disease changes. Old bilateral basal ganglia lacunar infarcts. No acute intracranial abnormality. Specifically, no hemorrhage,  hydrocephalus, mass lesion, acute infarction, or significant intracranial injury. Vascular: No hyperdense vessel or unexpected calcification. Skull: No acute calvarial abnormality. Sinuses/Orbits: Visualized paranasal sinuses and mastoids clear. Orbital soft tissues unremarkable. Other: None IMPRESSION: Old bilateral basal ganglia lacunar infarcts. Atrophy, chronic microvascular disease. No acute intracranial abnormality. Electronically Signed   By: Rolm Baptise M.D.   On: 06/27/2019 09:21   MR TIBIA FIBULA RIGHT WO CONTRAST  Result Date: 06/27/2019 CLINICAL DATA:  Right lower extremity swelling with multiple wounds EXAM: MRI OF LOWER RIGHT EXTREMITY WITHOUT CONTRAST TECHNIQUE: Multiplanar, multisequence MR imaging of the right tibia and fibula was performed. No intravenous contrast was administered. Field of view is from the level of the knee joint to the level of the distal tibial diaphysis. COMPARISON:  X-ray 05/26/2019 FINDINGS: Bones/Joint/Cartilage No acute fracture. No malalignment. Susceptibility artifact from distal fibular fixation hardware is partially included the inferior margin of the field of view. No bone marrow edema. There is preservation of the fatty T1 bone marrow signal. No cortical destruction or periostitis. Ligaments Grossly intact. Muscles and Tendons Mild fatty infiltration of the lower leg musculature. Mild diffuse edema like intramuscular signal. No intramuscular fluid collection. Included tendinous structures grossly intact. Soft tissues Circumferential subcutaneous edema throughout the lower leg. No well-defined soft tissue fluid collection. No deep skin ulceration. There is mild deep fascial edema, not out of proportion to the degree of soft tissue edema. No deep fascial fluid collection. A small Baker's cyst is incidentally noted. IMPRESSION: 1. Nonspecific circumferential subcutaneous edema throughout the right lower leg. No well-defined soft tissue fluid collection. 2. No acute  osseous abnormality or evidence of osteomyelitis. 3. Mild diffuse intramuscular edema, which may reflect a nonspecific myositis versus denervation changes. Electronically Signed   By: Davina Poke D.O.   On: 06/27/2019 19:33   MR FOOT RIGHT WO CONTRAST  Result Date: 06/27/2019 CLINICAL DATA:  Right foot wound EXAM: MRI OF THE RIGHT FOREFOOT WITHOUT CONTRAST TECHNIQUE: Multiplanar, multisequence MR imaging of the right forefoot was performed. No intravenous contrast was administered. COMPARISON:  MRI 06/01/2019 FINDINGS: Bones/Joint/Cartilage No acute fracture. No dislocation. No bone marrow edema. There is preservation of the fatty T1 bone marrow signal throughout the right forefoot. No cortical destruction. Similar mild degenerative changes. No large joint effusion. Ligaments Intact Lisfranc ligament. Collateral ligaments of the forefoot appear intact. Muscles and Tendons Diffuse edema like signal throughout  the intrinsic foot musculature suggesting a nonspecific myositis. Small volume tenosynovial fluid within the extensor digitorum longus tendon sheath at the level of the midfoot. Flexor tendons appear unremarkable. Soft tissues Marked dorsal soft tissue swelling and subcutaneous edema with multiple areas of dorsal skin irregularity suggesting wounds or ulcerations. Slightly ill-defined multilobulated fluid collection at the dorsal aspect of the proximal forefoot laterally measuring approximately 4.8 x 1.0 x 7.4 cm (series 5, image 36; series 6, image 14). Numerous additional lobulated areas of fluid are seen more proximally within the midfoot as well as the medial hindfoot (series 5, images 44 and 51). There is intermetatarsal bursal fluid within the first and second intermetatarsal spaces. IMPRESSION: 1. No evidence of acute osteomyelitis of the right forefoot. 2. Marked dorsal soft tissue swelling and subcutaneous edema with multiple areas of dorsal skin irregularity suggesting wounds or ulcerations.  Slightly ill-defined multilobulated fluid collection at the dorsal aspect of the proximal forefoot laterally measuring 4.8 x 1.0 x 7.4 cm. Numerous additional lobulated areas of fluid are seen more proximally within the dorsal midfoot as well as the medial hindfoot. Findings are concerning for developing abscesses. 3. Diffuse edema like signal throughout the intrinsic foot musculature suggesting a nonspecific myositis. 4. Mild extensor digitorum longus tenosynovitis. Electronically Signed   By: Davina Poke D.O.   On: 06/27/2019 19:10   MR TIBIA FIBULA LEFT WO CONTRAST  Result Date: 06/28/2019 CLINICAL DATA:  Left lower leg swelling with multiple wounds present. EXAM: MRI OF LOWER LEFT EXTREMITY WITHOUT CONTRAST TECHNIQUE: Multiplanar, multisequence MR imaging of the left lower leg was performed. No intravenous contrast was administered. COMPARISON:  None. FINDINGS: Bones/Joint/Cartilage Four sequences are provided as the patient could not tolerate further scanning. There is some motion on the study. No marrow signal abnormality suggest osteomyelitis. No fracture or focal lesion. Ligaments Negative. Muscles and Tendons No intramuscular fluid collection is seen. Lower leg musculature is atrophic. Soft tissues Diffuse subcutaneous edema is present. No focal fluid collection is identified although evaluation for abscess is limited on this study. IMPRESSION: Negative for osteomyelitis or myositis. Diffuse subcutaneous edema about the lower leg compatible with cellulitis or dependent change. No subcutaneous abscess is identified but evaluation is limited due to motion and early termination of the exam. Electronically Signed   By: Inge Rise M.D.   On: 06/28/2019 11:31   MR FOOT LEFT WO CONTRAST  Result Date: 06/27/2019 CLINICAL DATA:  Left foot ulceration EXAM: MRI OF THE LEFT FOOT WITHOUT CONTRAST TECHNIQUE: Multiplanar, multisequence MR imaging of the left forefoot was performed. No intravenous  contrast was administered. COMPARISON:  X-ray 06/21/2018 FINDINGS: Technical note: Significantly limited exam. The obtained sequences are significantly motion degraded. No short axis fluid sensitive sequence was obtained. No sagittal sequence was obtained. 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 Subtle edema within the first metatarsal head with preservation of the fatty T1 bone marrow signal. The remaining included osseous structures appear to have preserved T1 marrow signal. No evidence of cortical destruction. No acute fracture identified. No dislocation. Ligaments Grossly intact. Muscles and Tendons Diffuse edema like intramuscular signal suggesting a nonspecific myositis. No appreciable tenosynovial fluid collection on the included images. Soft tissues No evidence of a large soft tissue ulceration. No appreciable fluid collection. There is mild soft tissue swelling. IMPRESSION: 1. Significantly limited, motion degraded exam. Patient was unable to complete the full examination. 2. 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 Subtle edema  within the first metatarsal head with preservation of the fatty T1 bone marrow signal. Findings are favored to represent reactive osteitis. Early acute osteomyelitis is felt to be unlikely but would be difficult to entirely exclude on the provided images. 3. Diffuse edema-like intramuscular signal suggesting a nonspecific myositis. Electronically Signed   By: Davina Poke D.O.   On: 06/27/2019 19:17   Portable chest 1 View  Result Date: 06/28/2019 CLINICAL DATA:  Pulmonary edema EXAM: PORTABLE CHEST 1 VIEW COMPARISON:  Yesterday FINDINGS: Cardiomegaly. Aortic valve replacement and CABG. Hazy bilateral chest, worse on the left, attributed to layering pleural fluid and presumed atelectasis. No superimposed Kerley lines clearly seen. No pneumothorax IMPRESSION: No progression of the hazy left more than  right chest usually from atelectasis and layering effusions. Electronically Signed   By: Monte Fantasia M.D.   On: 06/28/2019 04:55   DG Chest Portable 1 View  Result Date: 06/27/2019 CLINICAL DATA:  Hypoxia. EXAM: PORTABLE CHEST 1 VIEW COMPARISON:  05/26/2019 FINDINGS: Cardiomegaly. 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 Diffuse hazy appearance of the lungs with pleural fluid and vascular congestion. No visible air leak. IMPRESSION: Low volume chest with hazy opacity from pleural fluid and presumed atelectasis. Suspect CHF. Electronically Signed   By: Monte Fantasia M.D.   On: 06/27/2019 06:09   ECHOCARDIOGRAM COMPLETE  Result Date: 06/27/2019    ECHOCARDIOGRAM REPORT   Patient Name:   KEYLE DOBY Date of Exam: 06/27/2019 Medical Rec #:  [id number]         Height:       65.0 in Accession #:    [id number]        Weight:       253.5 lb Date of Birth:  1955/06/08         BSA:          2.188 m Patient Age:    81 years          BP:           119/100 mmHg Patient Gender: F                 HR:           85 bpm. Exam Location:  Inpatient Procedure: 2D Echo, Cardiac Doppler and Color Doppler Indications:    CHF-Acute Systolic 408.14 / G81.85  History:        Patient has prior history of Echocardiogram examinations, most                 recent 05/30/2019. CHF and Cardiomyopathy, CAD, COPD,                 Arrythmias:non-specific ST changes; Risk Factors:Hypertension,                 Diabetes, Sleep Apnea and Current Smoker. GERD.  Sonographer:    Vickie Epley RDCS Referring Phys: [id number] [place]  1. Septal dyskinesis consistent with RBBB. Left ventricular ejection fraction, by estimation, is 40 to 45%. The left ventricle has mildly decreased function. The left ventricle has no regional wall motion abnormalities. Left ventricular diastolic parameters are consistent with Grade II diastolic dysfunction (pseudonormalization). Elevated left ventricular end-diastolic pressure.  2. Right  ventricular systolic function is moderately reduced. The right ventricular size is normal. There is mildly elevated pulmonary artery systolic pressure.  3. The mitral valve is normal in structure. Mild mitral valve regurgitation. No evidence of mitral stenosis.  4. The aortic valve is  tricuspid. Aortic valve regurgitation is not visualized. No aortic stenosis is present.  5. The inferior vena cava is dilated in size with <50% respiratory variability, suggesting right atrial pressure of 15 mmHg. FINDINGS  Left Ventricle: Septal dyskinesis consistent with RBBB. Left ventricular ejection fraction, by estimation, is 40 to 45%. The left ventricle has mildly decreased function. The left ventricle has no regional wall motion abnormalities. The left ventricular  internal cavity size was normal in size. There is no left ventricular hypertrophy. Left ventricular diastolic parameters are consistent with Grade II diastolic dysfunction (pseudonormalization). Elevated left ventricular end-diastolic pressure. Right Ventricle: The right ventricular size is normal. No increase in right ventricular wall thickness. Right ventricular systolic function is moderately reduced. There is mildly elevated pulmonary artery systolic pressure. The tricuspid regurgitant velocity is 2.70 m/s, and with an assumed right atrial pressure of 15 mmHg, the estimated right ventricular systolic pressure is 01.7 mmHg. Left Atrium: Left atrial size was normal in size. Right Atrium: Right atrial size was normal in size. Pericardium: There is no evidence of pericardial effusion. Mitral Valve: The mitral valve is normal in structure. Normal mobility of the mitral valve leaflets. Moderate mitral annular calcification. Mild mitral valve regurgitation. No evidence of mitral valve stenosis. Tricuspid Valve: The tricuspid valve is normal in structure. Tricuspid valve regurgitation is mild . No evidence of tricuspid stenosis. Aortic Valve: The aortic valve is  tricuspid. Aortic valve regurgitation is not visualized. No aortic stenosis is present. Pulmonic Valve: The pulmonic valve was normal in structure. Pulmonic valve regurgitation is not visualized. No evidence of pulmonic stenosis. Aorta: The aortic root is normal in size and structure. Venous: The inferior vena cava is dilated in size with less than 50% respiratory variability, suggesting right atrial pressure of 15 mmHg. IAS/Shunts: There is left bowing of the interatrial septum, suggestive of elevated right atrial pressure. No atrial level shunt detected by color flow Doppler.  LEFT VENTRICLE PLAX 2D LVIDd:         5.00 cm      Diastology LVIDs:         3.90 cm      LV e' lateral:   5.59 cm/s LV PW:         1.00 cm      LV E/e' lateral: 25.2 LV IVS:        1.00 cm      LV e' medial:    3.73 cm/s LVOT diam:     1.90 cm      LV E/e' medial:  37.8 LV SV:         51 LV SV Index:   23 LVOT Area:     2.84 cm  LV Volumes (MOD) LV vol d, MOD A2C: 139.0 ml LV vol d, MOD A4C: 140.0 ml LV vol s, MOD A2C: 78.4 ml LV vol s, MOD A4C: 94.8 ml LV SV MOD A2C:     60.6 ml LV SV MOD A4C:     140.0 ml LV SV MOD BP:      54.3 ml RIGHT VENTRICLE RV S prime:     3.49 cm/s TAPSE (M-mode): 1.1 cm LEFT ATRIUM             Index       RIGHT ATRIUM           Index LA diam:        4.50 cm 2.06 cm/m  RA Area:     19.80 cm LA Vol (A2C):  62.3 ml 28.47 ml/m RA Volume:   57.70 ml  26.37 ml/m LA Vol (A4C):   47.6 ml 21.76 ml/m LA Biplane Vol: 57.8 ml 26.42 ml/m  AORTIC VALVE LVOT Vmax:   98.60 cm/s LVOT Vmean:  65.400 cm/s LVOT VTI:    0.180 m  AORTA Ao Root diam: 2.90 cm MITRAL VALVE                TRICUSPID VALVE MV Area (PHT): 4.57 cm     TR Peak grad:   29.2 mmHg MV Decel Time: 166 msec     TR Mean grad:   20.0 mmHg MR Peak grad: 110.7 mmHg    TR Vmax:        270.00 cm/s MR Mean grad: 70.0 mmHg     TR Vmean:       209.0 cm/s MR Vmax:      526.00 cm/s MR Vmean:     389.0 cm/s    SHUNTS MV E velocity: 141.00 cm/s  Systemic VTI:  0.18 m MV  A velocity: 72.80 cm/s   Systemic Diam: 1.90 cm MV E/A ratio:  1.94 Skeet Latch MD Electronically signed by Skeet Latch MD Signature Date/Time: 06/27/2019/3:12:26 PM    Final         Scheduled Meds: . furosemide  60 mg Intravenous TID  . heparin  5,000 Units Subcutaneous Q8H  . LORazepam  2-4 mg Intravenous Once   Continuous Infusions: . ceFEPime (MAXIPIME) IV Stopped (06/27/19 2216)  . vancomycin       LOS: 1 day     Cordelia Poche, MD [facility] 06/28/2019, 12:26 PM  If 7PM-7AM, please contact night-coverage [email or web address]

## 2019-06-28 NOTE — Progress Notes (Signed)
RT entered the room and started to evaluate the Pt. RT removed the BIPAP to assess Pt awareness. The Pt was alert and answered several questions appropriately. RT placed the Pt on 2L [place] and her SATS are 100%. Pt seems to be comfortable and her breathing is good. RT will continue to monitor.

## 2019-06-29 LAB — AMMONIA: Ammonia: 40 umol/L — ABNORMAL HIGH (ref 9–35)

## 2019-06-29 LAB — CULTURE, BLOOD (ROUTINE X 2): Special Requests: ADEQUATE

## 2019-06-29 LAB — GLUCOSE, CAPILLARY
Glucose-Capillary: 119 mg/dL — ABNORMAL HIGH (ref 70–99)
Glucose-Capillary: 101 mg/dL — ABNORMAL HIGH (ref 70–99)
Glucose-Capillary: 109 mg/dL — ABNORMAL HIGH (ref 70–99)
Glucose-Capillary: 114 mg/dL — ABNORMAL HIGH (ref 70–99)

## 2019-06-29 MED ORDER — ADULT MULTIVITAMIN W/MINERALS CH
1.0000 | ORAL_TABLET | Freq: Every day | ORAL | Status: DC
  Administered 2019-06-29 – 2019-07-03 (×4): 1 via ORAL
  Filled 2019-06-29 (×5): qty 1

## 2019-06-29 MED ORDER — ENSURE ENLIVE PO LIQD
237.0000 mL | Freq: Three times a day (TID) | ORAL | Status: DC
  Administered 2019-06-29 – 2019-07-03 (×8): 237 mL via ORAL

## 2019-06-29 NOTE — Progress Notes (Signed)
Came to eval for prn bipap.  Pt appears comfortable, no distress noted.  Pt denies SOB and states she doesn't feel she needs bipap.  RN in room and aware.

## 2019-06-29 NOTE — Progress Notes (Signed)
PROGRESS NOTE  [name] PPJ:[id number] DOB: [birth year] DOA: 06/27/2019 PCP: [place]   LOS: 2 days   Brief Narrative / Interim history: 64 year old female with chronic systolic CHF, DM 2, uncontrolled and with complications,OSA, chronic hep C with liver cirrhosis, chronic lower extremity venous ulcers, CKD stage IIIb, chronic pain was admitted to the hospital on 6/10 with acute metabolic encephalopathy as well as hypoxia.  She does not wear oxygen usually.  She has been having bilateral lower extremity ulcers /  cellulitis and recently completed a course of Augmentin on June 8.  She was recently hospitalized in May 21 at [facility] for group C strep bacteremia.  She underwent a TEE at that time due to prosthetic aortic valve and endocarditis has been ruled out.  Subjective / 24h Interval events: She is a bit sleepy this morning but wakes up and tells me that she is feeling well.  She denies any shortness of breath.  She denies any pain.  No abdominal pain, no nausea or vomiting.  Assessment & Plan: Principal Problem Acute on chronic venous stasis ulcers/right foot abscess/left leg cellulitis -likely the main issue driving her encephalopathy.  An MRI of the right foot showed marked dorsal soft tissue swelling with subcutaneous edema with areas suggesting wounds/ulcerations, with a multiloculated fluid collection concerning for abscess.  Left foot MRI subtle bone edema however felt not to represent osteomyelitis but osteitis.  Patient was transferred from [facility] long hospital to [facility] for vascular surgery consultation.  Appreciate follow-up. -Keep on broad-spectrum antibiotics with vancomycin and cefepime  Active Problems Acute metabolic encephalopathy-in the setting of infectious process, also hypoxia and hypercapnia.  Improving, maintaining good sats, and she was on BiPAP orally during this admission.  Acute hypoxic and hypercarbic respiratory failure-ABGs on admission  reviewed, mild respiratory acidosis, uncompensated.  She required BiPAP.  Continue to closely monitor.  Wean off to room air  Acute on chronic systolic [id number]-[id number] echo was done this admission which showed septal dyskinesis consistent with RBBB.  LVEF was 40-45% without WMA.  Echo also showed grade 2 DD.  Chest x-ray on admission showed cardiomegaly, layering pleural effusions consistent with fluid overload.  She was diuresed with Lasix, net -975 mL today, weights improved from 253 pounds on admission to 240 today.  She has received 4 doses of Lasix in the first day of admission and has been held since.  Continue strict ins and outs, daily weights  Acute kidney injury on chronic kidney disease stage IIIb -Baseline creatinine 1.7-1.9, creatinine 2.4 this morning.  Hold further Lasix and monitor.  Avoid IV fluids  Positive blood cultures-1/4 bottles, GPRs, diphtheroids.  This is likely contaminant.  Hyperkalemia -6.1 on admission, improved to 4.2 this morning  Hypoalbuminemia -Likely complication of malnourishment in addition to history of cirrhosis.  History of hepatic encephalopathy -Normal ammonia on admission. Continue Lactulose  Anemia -Chronic. Likely secondary to chronic disease. Most recent iron studies suggests possible combination of iron deficiency and chronic disease. Patient is on iron supplementation as an outpatient. Stable.  Chronic hepatitis C Cirrhosis  Diabetes mellitus, type 2 -Last hemoglobin A1C of 7.4%. SSI while inpatient  Pressure injury -Right anterior foot, left heel, right medial foot. POA.  Morbid obesity -Body mass index is 41.31 kg/m.  Pressure Injury 05/05/19 Thigh Distal;Left;Posterior Unstageable - Full thickness tissue loss in which the base of the injury is covered by slough (yellow, tan, gray, green or brown) and/or eschar (tan, brown or black) in the wound bed.  4"x4" area with m (Active)  05/05/19 0700  Location: Thigh  Location Orientation:  Distal;Left;Posterior  Staging: Unstageable - Full thickness tissue loss in which the base of the injury is covered by slough (yellow, tan, gray, green or brown) and/or eschar (tan, brown or black) in the wound bed.  Wound Description (Comments): 4"x4" area with mixture of pink granulated wound bed and yellow slough.  Present on Admission: Yes     Pressure Injury 06/27/19 Foot Anterior;Right Unstageable - Full thickness tissue loss in which the base of the injury is covered by slough (yellow, tan, gray, green or brown) and/or eschar (tan, brown or black) in the wound bed. (Active)  06/27/19 2040  Location: Foot  Location Orientation: Anterior;Right  Staging: Unstageable - Full thickness tissue loss in which the base of the injury is covered by slough (yellow, tan, gray, green or brown) and/or eschar (tan, brown or black) in the wound bed.  Wound Description (Comments):   Present on Admission: Yes     Pressure Injury 06/27/19 Heel Left Deep Tissue Pressure Injury - Purple or maroon localized area of discolored intact skin or blood-filled blister due to damage of underlying soft tissue from pressure and/or shear. (Active)  06/27/19 2040  Location: Heel  Location Orientation: Left  Staging: Deep Tissue Pressure Injury - Purple or maroon localized area of discolored intact skin or blood-filled blister due to damage of underlying soft tissue from pressure and/or shear.  Wound Description (Comments):   Present on Admission:      Pressure Injury 06/27/19 Foot Right;Medial Unstageable - Full thickness tissue loss in which the base of the injury is covered by slough (yellow, tan, gray, green or brown) and/or eschar (tan, brown or black) in the wound bed. (Active)  06/27/19 2040  Location: Foot  Location Orientation: Right;Medial  Staging: Unstageable - Full thickness tissue loss in which the base of the injury is covered by slough (yellow, tan, gray, green or brown) and/or eschar (tan, brown or  black) in the wound bed.  Wound Description (Comments):   Present on Admission:     Scheduled Meds: . atorvastatin  40 mg Oral Daily  . feeding supplement (ENSURE ENLIVE)  237 mL Oral TID BM  . folic acid  1 mg Oral Daily  . heparin  5,000 Units Subcutaneous Q8H  . insulin aspart  0-15 Units Subcutaneous TID WC  . insulin aspart  0-5 Units Subcutaneous QHS  . lactulose  20 g Oral TID  . LORazepam  2-4 mg Intravenous Once  . mirtazapine  30 mg Oral QHS  . multivitamin with minerals  1 tablet Oral Daily   Continuous Infusions: . ceFEPime (MAXIPIME) IV 2 g (06/29/19 1021)  . vancomycin 1,250 mg (06/28/19 1724)   PRN Meds:.acetaminophen **OR** acetaminophen  DVT prophylaxis: heparin Code Status: Full code Family Communication: no family at bedside  Status is: Inpatient  Remains inpatient appropriate because:Ongoing diagnostic testing needed not appropriate for outpatient work up, IV treatments appropriate due to intensity of illness or inability to take PO and Inpatient level of care appropriate due to severity of illness   Dispo: The patient is from: Home              Anticipated d/c is to: TBD              Anticipated d/c date is: > 3 days              Patient currently is not medically stable to d/c.  Consultants:  Vascular surgery   Procedures:  2D echo  Microbiology  None   Antimicrobials: Vancomycin / Cefepime 6/10 >>    Objective: Vitals:   06/29/19 0024 06/29/19 0329 06/29/19 0338 06/29/19 0745  BP: 132/73  133/86 115/69  Pulse: 85  93 90  Resp: 18  17 18   Temp: 98.1 F (36.7 C)  98.1 F (36.7 C) 98.5 F (36.9 C)  TempSrc: Oral  Oral Oral  SpO2: 100%  100% 100%  Weight:  109.3 kg    Height:        Intake/Output Summary (Last 24 hours) at 06/29/2019 1051 Last data filed at 06/29/2019 0342 Gross per 24 hour  Intake 150 ml  Output 425 ml  Net -275 ml   Filed Weights   06/28/19 0500 06/28/19 1843 06/29/19 0329  Weight: 112.6 kg 111.7 kg 109.3  kg    Examination:  Constitutional: no distress Eyes: no scleral icterus ENMT: Mucous membranes are moist.  Neck: normal, supple Respiratory: shallow respirations, overall clear to auscultation bilaterally, no wheezing, no crackles. Normal respiratory effort. No accessory muscle use.  Cardiovascular: Regular rate and rhythm, no murmurs appreciated. Trace edema Abdomen: non distended, no tenderness. Bowel sounds positive.  Musculoskeletal: no clubbing / cyanosis.  Neurologic: grossly non focal    Data Reviewed: I have independently reviewed following labs and imaging studies   CBC: Recent Labs  Lab 06/27/19 0536 06/28/19 0446  WBC 11.0* 8.4  NEUTROABS 8.8* 6.2  HGB 8.8* 8.8*  HCT 33.7* 32.9*  MCV 85.1 83.7  PLT 181 373   Basic Metabolic Panel: Recent Labs  Lab 06/27/19 0536 06/27/19 1044 06/28/19 0617  NA 140 143 141  K 6.1* 5.5* 5.2*  CL 109 111 108  CO2 23 22 22   GLUCOSE 139* 124* 90  BUN 39* 38* 45*  CREATININE 2.40* 2.25* 2.41*  CALCIUM 8.2* 7.7* 8.1*  MG  --  1.8 1.8  PHOS  --  4.6 4.6   Liver Function Tests: Recent Labs  Lab 06/27/19 0536 06/28/19 0617  AST 30 24  ALT 16 14  ALKPHOS 218* 167*  BILITOT 1.0 0.8  PROT 9.1* 8.0  ALBUMIN 2.1* 1.9*   Coagulation Profile: No results for input(s): INR, PROTIME in the last 168 hours. HbA1C: No results for input(s): HGBA1C in the last 72 hours. CBG: Recent Labs  Lab 06/27/19 0651 06/28/19 0027 06/28/19 1633 06/28/19 2140 06/29/19 0619  GLUCAP 129* 96 100* 131* 119*    Recent Results (from the past 240 hour(s))  Blood culture (routine x 2)     Status: Abnormal   Collection Time: 06/27/19  5:46 AM   Specimen: BLOOD LEFT HAND  Result Value Ref Range Status   Specimen Description   Final    BLOOD LEFT HAND Performed at [facility] [address] [place], [place] [address]    Special Requests   Final    BOTTLES DRAWN AEROBIC ONLY Blood Culture adequate volume Performed  at [facility] [address], [place], [place] [address]    Culture  Setup Time   Final    GRAM POSITIVE RODS AEROBIC BOTTLE ONLY CRITICAL RESULT CALLED TO, READ BACK BY AND VERIFIED WITH: PHARMD L POINDEXTER [id number] AT 1016 BY CM    Culture (A)  Final    DIPHTHEROIDS(CORYNEBACTERIUM SPECIES) Standardized susceptibility testing for this organism is not available. Performed at [facility], [place] [address] [place], [place] [address]    Report Status 06/29/2019  FINAL  Final  SARS Coronavirus 2 by RT PCR (hospital order, performed in [facility] hospital lab) Nasopharyngeal Nasopharyngeal Swab     Status: None   Collection Time: 06/27/19  6:26 AM   Specimen: Nasopharyngeal Swab  Result Value Ref Range Status   SARS Coronavirus 2 NEGATIVE NEGATIVE Final    Comment: (NOTE) SARS-CoV-2 target nucleic acids are NOT DETECTED.  The SARS-CoV-2 RNA is generally detectable in upper and lower respiratory specimens during the acute phase of infection. The lowest concentration of SARS-CoV-2 viral copies this assay can detect is 250 copies / mL. A negative result does not preclude SARS-CoV-2 infection and should not be used as the sole basis for treatment or other patient management decisions.  A negative result may occur with improper specimen collection / handling, submission of specimen other than nasopharyngeal swab, presence of viral mutation(s) within the areas targeted by this assay, and inadequate number of viral copies (<250 copies / mL). A negative result must be combined with clinical observations, patient history, and epidemiological information.  Fact Sheet for Patients:   StrictlyIdeas.no  Fact Sheet for Healthcare Providers: BankingDealers.co.za  This test is not yet approved or  cleared by the [place] FDA and has been authorized for detection and/or diagnosis of SARS-CoV-2 by FDA under an Emergency  Use Authorization (EUA).  This EUA will remain in effect (meaning this test can be used) for the duration of the COVID-19 declaration under Section 564(b)(1) of the Act, 21 U.S.C. section 360bbb-3(b)(1), unless the authorization is terminated or revoked sooner.  Performed at [facility], [place] [address], [place], [place] [address]   Blood culture (routine x 2)     Status: None (Preliminary result)   Collection Time: 06/27/19  6:30 AM   Specimen: BLOOD  Result Value Ref Range Status   Specimen Description   Final    BLOOD BLOOD RIGHT FOREARM Performed at [facility] [address] [place], [place] [address]    Special Requests   Final    BOTTLES DRAWN AEROBIC ONLY Blood Culture results may not be optimal due to an inadequate volume of blood received in culture bottles Performed at [facility] [address] [place], [place] [address]    Culture   Final    NO GROWTH 2 DAYS Performed at [facility] [address], [place], [place] [address]    Report Status PENDING  Incomplete  Urine culture     Status: None   Collection Time: 06/27/19 12:42 PM   Specimen: Urine, Random  Result Value Ref Range Status   Specimen Description   Final    URINE, RANDOM Performed at [facility] [address] [place], [place] [address]    Special Requests   Final    NONE Performed at [facility], [place] [address] [place], [place] [address]    Culture   Final    NO GROWTH Performed at [facility], [place] [address] [place],  [address]    Report Status 06/28/2019 FINAL  Final     Radiology Studies: MR TIBIA FIBULA LEFT WO CONTRAST  Result Date: 06/28/2019 CLINICAL DATA:  Left lower leg swelling with multiple wounds present. EXAM: MRI OF LOWER LEFT EXTREMITY WITHOUT CONTRAST TECHNIQUE: Multiplanar, multisequence MR imaging of the left lower leg was performed. No intravenous  contrast was administered. COMPARISON:  None. FINDINGS: Bones/Joint/Cartilage Four sequences are provided as the patient could not tolerate  further scanning. There is some motion on the study. No marrow signal abnormality suggest osteomyelitis. No fracture or focal lesion. Ligaments Negative. Muscles and Tendons No intramuscular fluid collection is seen. Lower leg musculature is atrophic. Soft tissues Diffuse subcutaneous edema is present. No focal fluid collection is identified although evaluation for abscess is limited on this study. IMPRESSION: Negative for osteomyelitis or myositis. Diffuse subcutaneous edema about the lower leg compatible with cellulitis or dependent change. No subcutaneous abscess is identified but evaluation is limited due to motion and early termination of the exam. Electronically Signed   By: Inge Rise M.D.   On: 06/28/2019 11:31   Marzetta Board, MD, PhD [facility]  Between 7 am - 7 pm I am available, please contact me via Amion or Securechat  Between 7 pm - 7 am I am not available, please contact night coverage MD/APP via Amion

## 2019-06-29 NOTE — Social Work (Signed)
CSW attempted to call patient's husband however had to leave message. CSW awaiting a call back.

## 2019-06-29 NOTE — Progress Notes (Addendum)
Initial Nutrition Assessment  DOCUMENTATION CODES:   Obesity unspecified  INTERVENTION:   Liberalized diet to regular   Ensure Enlive po TID, each supplement provides 350 kcal and 20 grams of protein  Provide MVI daily  NUTRITION DIAGNOSIS:   Increased nutrient needs related to wound healing as evidenced by estimated needs.  GOAL:   Patient will meet greater than or equal to 90% of their needs  MONITOR:   PO intake, Supplement acceptance, Weight trends, Labs, I & O's, Skin  REASON FOR ASSESSMENT:   Consult Assessment of nutrition requirement/status  ASSESSMENT:   Patient with PMH significant for CHF, NSTEMI, COPD, DM, gastric ulcer, chronic hepatitis C, cirrhosis of liver, and CKD III. Presents this admission with acute exacerbation CHF and bilateral lower extremity wounds.   Likely will require R BKA. No plan for amputation on L.   RD working remotely.Unable to reach pt by phone. No meal completions charted this admission. Pt admitted one month ago at [facility]. Had poor intake and nutrition support was reccommended. Will try nutrition supplements this admission and monitor intake.   Records indicate pt weighed 115.5 kg on 6/1 and 109.3 kg this admission. Unable to determine dry weight loss vs fluid fluctuations given history of CHF.   Medications: folic acid, SS novolog, lactulose, remeron Labs: K 5.2 (H) Cr 2.41 (baseline 1.95)  Diet Order:   Diet Order            Diet regular Room service appropriate? Yes; Fluid consistency: Thin  Diet effective now                 EDUCATION NEEDS:   Not appropriate for education at this time  Skin:  Skin Assessment: Skin Integrity Issues: Skin Integrity Issues:: Other (Comment), DTI, Unstageable DTI: L heel Unstageable: L thigh, R foot Other: bilateral L/R pretibial wounds  Last BM:  6/11  Height:   Ht Readings from Last 1 Encounters:  06/28/19 5' 5"  (1.651 m)    Weight:   Wt Readings from Last 1 Encounters:   06/29/19 109.3 kg    BMI:  Body mass index is 40.1 kg/m.  Estimated Nutritional Needs:   Kcal:  1900-2100 kcal  Protein:  100-115 grams  Fluid:  >/= 1.9 L/day   Mariana Single RD, LDN Clinical Nutrition Pager listed in [place]

## 2019-06-29 NOTE — Progress Notes (Signed)
[facility]  Subjective  -sleepy but arousable.     Objective 115/69 90 98.5 F (36.9 C) (Oral) 18 100%  Intake/Output Summary (Last 24 hours) at 06/29/2019 1113 Last data filed at 06/29/2019 0342 Gross per 24 hour  Intake 150 ml  Output 425 ml  Net -275 ml    Right Foot        Laboratory Lab Results: Recent Labs    06/27/19 0536 06/28/19 0446  WBC 11.0* 8.4  HGB 8.8* 8.8*  HCT 33.7* 32.9*  PLT 181 155   BMET Recent Labs    06/27/19 1044 06/28/19 0617  NA 143 141  K 5.5* 5.2*  CL 111 108  CO2 22 22  GLUCOSE 124* 90  BUN 38* 45*  CREATININE 2.25* 2.41*  CALCIUM 7.7* 8.1*    COAG Lab Results  Component Value Date   INR 1.6 (H) 05/30/2019   INR 1.7 (H) 05/27/2019   INR 1.2 05/07/2019   No results found for: PTT  Assessment/Planning:  64 year old female transferred from [place] with extensive tissue loss in the right foot as pictured above (also left heel wound).  I agree with Dr. Claretha Cooper assessment that she likely needs above-knee amputation and even with revascularization would have no chance of healing full-thickness eschar on the ankle and dorsum of her foot.  She resides at a nursing center and I am not certain she walks frankly given her decubitus ulcer.  I discussed with her husband this morning plan for right above-knee amputation on Monday and later staged intervention on the left leg.  He is somehow under the impression that this will improve with antibiotics and I discussed that I do not think that will provide any long-term solution certainly risk of infection and sepsis in the long-term.  He wants to come tomorrow and will make a decision about surgery on Monday after talking to her tomorrow.  [name] 06/29/2019 11:13 AM --

## 2019-06-29 NOTE — TOC Initial Note (Signed)
Transition of Care [facility]) - Initial/Assessment Note    Patient Details  Name: [name] MRN: [id number] Date of Birth: January 26, 1955  Transition of Care [facility]) CM/SW Contact:    Tanya Castilla, LCSW Phone Number: [phone] 06/29/2019, 4:25 PM  Clinical Narrative:                 CSW spoke with patient's husband due to patient's orientation. Patient's husband stated that the plan was for patient to return to [facility] once medically cleared. Patient's asked if permission could be granted for her sister to receive medical updates. Patient's sister is [facility] [phone]. CSW informed patient's husband that she would update RN with request.  CSW attempted to call [facility] however mailbox was full therefore could not leave message.   TOC team will continue to follow for discharge planning needs.  Expected Discharge Plan: Long Term Nursing Home Barriers to Discharge: Continued Medical Work up   Patient Goals and CMS Choice        Expected Discharge Plan and Services Expected Discharge Plan: [place]       Living arrangements for the past 2 months: [place] Expected Discharge Date:  (unknown)                                    Prior Living Arrangements/Services Living arrangements for the past 2 months: [place] Lives with:: Facility Resident                   Activities of Daily Living Home Assistive Devices/Equipment: Blood pressure cuff, Grab bars around toilet, Grab bars in shower, Hand-held shower hose, Hospital bed, [employer], Environmental consultant (specify type), Wheelchair, Scales, [place], CBG Meter ([facility] has necessary equipment for their residents. When patient was at home, she had a hospital bed, manual wheelchair, cbg and 2/4 wheeled walker) ADL Screening (condition at time of admission) Patient's cognitive ability adequate to safely complete daily activities?: No (patient on non rebreather  mask) Is the patient deaf or have difficulty hearing?: No Does the patient have difficulty seeing, even when wearing glasses/contacts?: No Does the patient have difficulty concentrating, remembering, or making decisions?: Yes Patient able to express need for assistance with ADLs?: No Does the patient have difficulty dressing or bathing?: Yes Independently performs ADLs?: No Communication: Independent Dressing (OT): Dependent Is this a change from baseline?: Change from baseline, expected to last >3 days Grooming: Dependent Is this a change from baseline?: Change from baseline, expected to last >3 days Feeding: Dependent Is this a change from baseline?: Change from baseline, expected to last >3 days Bathing: Dependent Is this a change from baseline?: Change from baseline, expected to last >3 days Toileting: Dependent Is this a change from baseline?: Change from baseline, expected to last >3days In/Out Bed: Dependent Is this a change from baseline?: Change from baseline, expected to last >3 days Walks in Home: Dependent Is this a change from baseline?: Change from baseline, expected to last >3 days Does the patient have difficulty walking or climbing stairs?: Yes (secondary to shortness of breath and weakness) Weakness of Legs: Both Weakness of Arms/Hands: Both  Permission Sought/Granted      Share Information with NAME: [name]  Permission granted to share info w AGENCY: [place] granted to share info w Relationship: [place] granted to share info w Contact  Information: [phone]  Emotional Assessment Appearance:: Other (Comment Required (Unable to assess) Attitude/Demeanor/Rapport: Unable to Assess Affect (typically observed): Unable to Assess Orientation: : Oriented to Self, Oriented to Place, Fluctuating Orientation (Suspected and/or reported Sundowners)      Admission diagnosis:  Hyperkalemia [E87.5] Respiratory acidosis [E87.2] Pulmonary  edema [J81.1] Altered mental status, unspecified altered mental status type [H47.42] Acute metabolic encephalopathy [V95.63] Acute on chronic congestive heart failure, unspecified heart failure type [facility]) [I50.9] Patient Active Problem List   Diagnosis Date Noted  . Chronic systolic CHF (congestive heart failure) ([place]) 06/27/2019  . Venous stasis ulcer ([place]) 06/27/2019  . Type 2 diabetes mellitus with stage 3b chronic kidney disease, with long-term current use of insulin ([place])   . DNR (do not resuscitate) discussion   . Acute metabolic encephalopathy   . Hypotension   . Acute kidney injury superimposed on CKD ([place])   . Goals of care, counseling/discussion   . Palliative care by specialist   . Anasarca 05/26/2019  . Cellulitis 05/26/2019  . Diabetic foot ulcer ([place]) 05/26/2019  . Decreased level of consciousness 05/26/2019  . Moderate mitral regurgitation by prior echocardiogram 05/04/2019  . Moderate - severe tricuspid regurgitation by prior echocardiogram 05/04/2019  . CKD stage 3 due to type 2 diabetes mellitus ([place]) 05/04/2019  . HCV antibody positive 05/04/2019  . Other cirrhosis of liver ([place]) 05/04/2019  . Fall 02/24/2019  . UTI (urinary tract infection) 02/23/2019  . AVM (arteriovenous malformation) of small bowel, acquired with hemorrhage   . Epistaxis 11/29/2018  . Altered mental status   . Heme positive stool   . Gastritis and gastroduodenitis   . Encephalopathy acute   . Acute on chronic blood loss anemia 11/20/2018  . Pressure injury of skin 10/26/2018  . Anxiety 08/15/2018  . Chronic back pain 08/15/2018  . GERD (gastroesophageal reflux disease) 08/15/2018  . History of carpal tunnel syndrome 08/15/2018  . HNP (herniated nucleus pulposus), lumbar 08/15/2018  . Hyperlipidemia 08/15/2018  . Hypertension 08/15/2018  . Insomnia 08/15/2018  . Left ventricular hypertrophy 08/15/2018  . Obesity 08/15/2018  . Sleep apnea 08/15/2018  . Anemia of chronic disease  06/20/2018  . Diabetic polyneuropathy associated with type 2 diabetes mellitus ([place]) 02/23/2018  . Moderate tricuspid regurgitation 02/18/2018  . Acute on chronic diastolic CHF (congestive heart failure) ([place]) 02/08/2018  . 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 Depression 02/05/2018  . Generalized abdominal pain 02/05/2018  . CHF (congestive heart failure) ([place]) 01/13/2018  . B12 deficiency 12/07/2017  . Iron deficiency anemia due to chronic blood loss 12/07/2017  . Folate deficiency 12/07/2017  . Upper GI bleed 11/03/2017  . COPD (chronic obstructive pulmonary disease) ([place]) 09/26/2017  . Symptomatic anemia 09/26/2017  . Anemia   . Weakness   . Lower GI bleeding 08/07/2017  . Chronic systolic heart failure ([place]) 12/28/2016  . Tobacco use 12/28/2016  . NSTEMI (non-ST elevated myocardial infarction) ([place])   . Sepsis ([place]) 12/18/2016  . CHF exacerbation ([place]) 12/04/2015  . PNA (pneumonia) 10/28/2015  . Chronic hepatitis C without hepatic coma ([place]) 05/21/2014  . CAD (coronary artery disease) 07/19/2013  . Right arm pain 06/26/2013  . Cervical pain (neck) 03/28/2013  . Radiculopathy affecting upper extremity 03/28/2013  . Impaired renal function 12/21/2012  . Abnormal mammogram 12/20/2012  . Diabetes mellitus type 2, uncontrolled ([place]) 12/20/2012   PCP:  [facility]:   [place], [place] [place]  [place] [address] Phone: [phone] Fax: [phone]  [place], [place] - [address] [place] [address] Phone: [phone] Fax: [phone]  [id number] [address]: [phone]: [phone]    Social Determinants of Health (SDOH) Interventions    Readmission Risk Interventions Readmission Risk Prevention Plan 05/28/2019 02/26/2019 12/17/2018  Transportation Screening  Complete Complete Complete  PCP or Specialist Appt within 5-7 Days - - -  Home Care Screening - - -  Medication Review (RN CM) - - -  HRI or HTurnerWork Consult for RMasonconsult not completed comments - - -  Palliative Care Screening - - -  Medication Review (Press photographer Complete Complete Referral to Pharmacy  PCP or Specialist appointment within 3-5 days of discharge - (No Data) Not Complete  PCP/Specialist Appt Not Complete comments - - DC date unknown but pt is established with providers  HKilg[place]or Home Care Consult Complete Complete Not Complete  HRI or Home Care Consult Pt Refusal Comments - - SNF resident  SW Recovery Care/Counseling Consult Complete - Complete  Palliative Care Screening Not Applicable Not Applicable Not Complete  Comments - - pending need  SMinnetonkaComplete Patient Refused Complete  Some recent data might be hidden

## 2019-06-30 ENCOUNTER — Inpatient Hospital Stay (HOSPITAL_COMMUNITY): Payer: Medicaid Other

## 2019-06-30 LAB — CBC
HCT: 32.5 % — ABNORMAL LOW (ref 36.0–46.0)
Hemoglobin: 8.6 g/dL — ABNORMAL LOW (ref 12.0–15.0)
MCH: 22.4 pg — ABNORMAL LOW (ref 26.0–34.0)
MCHC: 26.5 g/dL — ABNORMAL LOW (ref 30.0–36.0)
MCV: 84.6 fL (ref 80.0–100.0)
Platelets: 159 10*3/uL (ref 150–400)
RBC: 3.84 MIL/uL — ABNORMAL LOW (ref 3.87–5.11)
RDW: 26.8 % — ABNORMAL HIGH (ref 11.5–15.5)
WBC: 7.2 10*3/uL (ref 4.0–10.5)
nRBC: 0 % (ref 0.0–0.2)

## 2019-06-30 LAB — COMPREHENSIVE METABOLIC PANEL WITH GFR
ALT: 12 U/L (ref 0–44)
AST: 19 U/L (ref 15–41)
Albumin: 1.8 g/dL — ABNORMAL LOW (ref 3.5–5.0)
Alkaline Phosphatase: 168 U/L — ABNORMAL HIGH (ref 38–126)
Anion gap: 7 (ref 5–15)
BUN: 40 mg/dL — ABNORMAL HIGH (ref 8–23)
CO2: 22 mmol/L (ref 22–32)
Calcium: 8.1 mg/dL — ABNORMAL LOW (ref 8.9–10.3)
Chloride: 114 mmol/L — ABNORMAL HIGH (ref 98–111)
Creatinine, Ser: 2.33 mg/dL — ABNORMAL HIGH (ref 0.44–1.00)
GFR calc Af Amer: 25 mL/min — ABNORMAL LOW
GFR calc non Af Amer: 21 mL/min — ABNORMAL LOW
Glucose, Bld: 126 mg/dL — ABNORMAL HIGH (ref 70–99)
Potassium: 4.3 mmol/L (ref 3.5–5.1)
Sodium: 143 mmol/L (ref 135–145)
Total Bilirubin: 0.9 mg/dL (ref 0.3–1.2)
Total Protein: 8.4 g/dL — ABNORMAL HIGH (ref 6.5–8.1)

## 2019-06-30 LAB — BLOOD GAS, ARTERIAL
Acid-base deficit: 2.5 mmol/L — ABNORMAL HIGH (ref 0.0–2.0)
Bicarbonate: 22.6 mmol/L (ref 20.0–28.0)
FIO2: 21
O2 Saturation: 91.2 %
Patient temperature: 37
pCO2 arterial: 45.3 mmHg (ref 32.0–48.0)
pH, Arterial: 7.32 — ABNORMAL LOW (ref 7.350–7.450)
pO2, Arterial: 61.9 mmHg — ABNORMAL LOW (ref 83.0–108.0)

## 2019-06-30 LAB — GLUCOSE, CAPILLARY
Glucose-Capillary: 128 mg/dL — ABNORMAL HIGH (ref 70–99)
Glucose-Capillary: 177 mg/dL — ABNORMAL HIGH (ref 70–99)
Glucose-Capillary: 118 mg/dL — ABNORMAL HIGH (ref 70–99)
Glucose-Capillary: 103 mg/dL — ABNORMAL HIGH (ref 70–99)

## 2019-06-30 LAB — TROPONIN I (HIGH SENSITIVITY)
Troponin I (High Sensitivity): 14 ng/L (ref <18)
Troponin I (High Sensitivity): 15 ng/L (ref <18)

## 2019-06-30 LAB — MAGNESIUM: Magnesium: 1.9 mg/dL (ref 1.7–2.4)

## 2019-06-30 MED ORDER — CHLORHEXIDINE GLUCONATE CLOTH 2 % EX PADS
6.0000 | MEDICATED_PAD | Freq: Every day | CUTANEOUS | Status: DC
  Administered 2019-06-30 – 2019-07-12 (×12): 6 via TOPICAL

## 2019-06-30 MED ORDER — DIPHENHYDRAMINE HCL 25 MG PO CAPS
25.0000 mg | ORAL_CAPSULE | Freq: Once | ORAL | Status: AC
  Administered 2019-06-30: 25 mg via ORAL
  Filled 2019-06-30: qty 1

## 2019-06-30 NOTE — Progress Notes (Addendum)
Paged by the RN that the patient has become more lethargic.  Evaluated patient at bedside, she is more lethargic and I saw her this morning but has her eyes open and seems to be moaning.  She is confused.  She has no complaints on her own but when asked whether she has any pain she says she has chest pain.  She knows she is in the hospital but cannot tell me where she is and does not answer any orientation questions.  She has had episodes like this for before, hence an MRI has been ordered yesterday around noon but unfortunately has not been done yet.  Vital signs appear stable, she was weaned off on room air and she is satting 95%.  Blood pressure is in the 915 systolic.  On exam she seems to have abdominal breathing, heart is regular and lung sounds are equal and diminished at the bases.  Obtained a stat EKG who looks similar to the one on admission without any significant changes.  Will obtain stat ABG and chest x-ray.  Obtain troponin x2, will transfer from [place] to progressive/stepdown.  -follow on high-sensitivity troponins -follow up on the MRI -follow on the ABG, may need BiPAP -Follow-up chest x-ray -will sign up to the night team  Critical Care Time devoted to patient care services, exclusive of separately billable procedures, described in this note is 35 minutes.   Charmane Protzman M. Cruzita Lederer, MD, PhD [facility]  Between 7 am - 7 pm you can contact me via [place] or [place].  I am not available 7 pm - 7 am, please contact night coverage MD/APP via Amion

## 2019-06-30 NOTE — Progress Notes (Signed)
PROGRESS NOTE  [name] XYI:[id number] DOB: [birth year] DOA: 06/27/2019 PCP: [place]   LOS: 3 days   Brief Narrative / Interim history: 64 year old female with chronic systolic CHF, DM 2, uncontrolled and with complications,OSA, chronic hep C with liver cirrhosis, chronic lower extremity venous ulcers, CKD stage IIIb, chronic pain was admitted to the hospital on 6/10 with acute metabolic encephalopathy as well as hypoxia.  She does not wear oxygen usually.  She has been having bilateral lower extremity ulcers /  cellulitis and recently completed a course of Augmentin on June 8.  She was recently hospitalized in May 21 at [facility] for group C strep bacteremia.  She underwent a TEE at that time due to prosthetic aortic valve and endocarditis has been ruled out.  Subjective / 24h Interval events: Eating breakfast, no specific complaints.  Still feels slightly off  Assessment & Plan: Principal Problem Acute on chronic venous stasis ulcers/right foot abscess/left leg cellulitis -likely the main issue driving her encephalopathy.  An MRI of the right foot showed marked dorsal soft tissue swelling with subcutaneous edema with areas suggesting wounds/ulcerations, with a multiloculated fluid collection concerning for abscess.  Left foot MRI subtle bone edema however felt not to represent osteomyelitis but osteitis.  Patient was transferred from [facility] long hospital to [facility] for vascular surgery consultation.  Appreciate follow-up, plan for surgery tomorrow -Keep on broad-spectrum antibiotics with vancomycin and cefepime  Active Problems Acute metabolic encephalopathy-in the setting of infectious process, also hypoxia and hypercapnia.  Improving, maintaining good sats, and she was on BiPAP orally during this admission.  Mental status seems better today than yesterday but she is still slow to respond to questions.  I have ordered an MRI of the brain, pending  Acute hypoxic and  hypercarbic respiratory failure-ABGs on admission reviewed, mild respiratory acidosis, uncompensated.  She required BiPAP.  Continue to closely monitor.  Respiratory status stable  Acute on chronic systolic OLM-7E echo was done this admission which showed septal dyskinesis consistent with RBBB.  LVEF was 40-45% without WMA.  Echo also showed grade 2 DD.  Chest x-ray on admission showed cardiomegaly, layering pleural effusions consistent with fluid overload.  She was diuresed with Lasix, net -975 mL today, weights improved from 253 pounds on admission to 245 today.  She has received 4 doses of Lasix in the first day of admission and has been held since.  Appears euvolemic this morning but is up 5 pounds, not sure if this was a bed or scale weight.  Will monitor  Acute kidney injury on chronic kidney disease stage IIIb -Baseline creatinine 1.7-1.9, increased up to 2.4 on 6/12 and now 2.3.  Monitor, if looks better tomorrow we will restart Lasix  Positive blood cultures-1/4 bottles, GPRs, diphtheroids.  This is likely contaminant.  Hyperkalemia -6.1 on admission, improved to 4.2 this morning  Hypoalbuminemia -Likely complication of malnourishment in addition to history of cirrhosis.  History of hepatic encephalopathy -Normal ammonia on admission. Continue Lactulose  Anemia -Chronic. Likely secondary to chronic disease. Most recent iron studies suggests possible combination of iron deficiency and chronic disease. Patient is on iron supplementation as an outpatient. Stable.  Chronic hepatitis C Cirrhosis  Diabetes mellitus, type 2 -Last hemoglobin A1C of 7.4%. SSI while inpatient  Pressure injury -Right anterior foot, left heel, right medial foot. POA.  Morbid obesity -Body mass index is 41.31 kg/m.  Pressure Injury 05/05/19 Thigh Distal;Left;Posterior Unstageable - Full thickness tissue loss in which the base of the  injury is covered by slough (yellow, tan, gray, green or brown) and/or  eschar (tan, brown or black) in the wound bed. 4"x4" area with m (Active)  05/05/19 0700  Location: Thigh  Location Orientation: Distal;Left;Posterior  Staging: Unstageable - Full thickness tissue loss in which the base of the injury is covered by slough (yellow, tan, gray, green or brown) and/or eschar (tan, brown or black) in the wound bed.  Wound Description (Comments): 4"x4" area with mixture of pink granulated wound bed and yellow slough.  Present on Admission: Yes     Pressure Injury 06/27/19 Foot Anterior;Right Unstageable - Full thickness tissue loss in which the base of the injury is covered by slough (yellow, tan, gray, green or brown) and/or eschar (tan, brown or black) in the wound bed. (Active)  06/27/19 2040  Location: Foot  Location Orientation: Anterior;Right  Staging: Unstageable - Full thickness tissue loss in which the base of the injury is covered by slough (yellow, tan, gray, green or brown) and/or eschar (tan, brown or black) in the wound bed.  Wound Description (Comments):   Present on Admission: Yes     Pressure Injury 06/27/19 Heel Left Deep Tissue Pressure Injury - Purple or maroon localized area of discolored intact skin or blood-filled blister due to damage of underlying soft tissue from pressure and/or shear. (Active)  06/27/19 2040  Location: Heel  Location Orientation: Left  Staging: Deep Tissue Pressure Injury - Purple or maroon localized area of discolored intact skin or blood-filled blister due to damage of underlying soft tissue from pressure and/or shear.  Wound Description (Comments):   Present on Admission:      Pressure Injury 06/27/19 Foot Right;Medial Unstageable - Full thickness tissue loss in which the base of the injury is covered by slough (yellow, tan, gray, green or brown) and/or eschar (tan, brown or black) in the wound bed. (Active)  06/27/19 2040  Location: Foot  Location Orientation: Right;Medial  Staging: Unstageable - Full thickness  tissue loss in which the base of the injury is covered by slough (yellow, tan, gray, green or brown) and/or eschar (tan, brown or black) in the wound bed.  Wound Description (Comments):   Present on Admission:     Scheduled Meds: . atorvastatin  40 mg Oral Daily  . Chlorhexidine Gluconate Cloth  6 each Topical Daily  . feeding supplement (ENSURE ENLIVE)  237 mL Oral TID BM  . folic acid  1 mg Oral Daily  . heparin  5,000 Units Subcutaneous Q8H  . insulin aspart  0-15 Units Subcutaneous TID WC  . insulin aspart  0-5 Units Subcutaneous QHS  . lactulose  20 g Oral TID  . LORazepam  2-4 mg Intravenous Once  . mirtazapine  30 mg Oral QHS  . multivitamin with minerals  1 tablet Oral Daily   Continuous Infusions: . ceFEPime (MAXIPIME) IV 2 g (06/30/19 0918)  . vancomycin 1,250 mg (06/29/19 1106)   PRN Meds:.acetaminophen **OR** acetaminophen  DVT prophylaxis: heparin Code Status: Full code Family Communication: no family at bedside  Status is: Inpatient  Remains inpatient appropriate because:Ongoing diagnostic testing needed not appropriate for outpatient work up, IV treatments appropriate due to intensity of illness or inability to take PO and Inpatient level of care appropriate due to severity of illness   Dispo: The patient is from: Home              Anticipated d/c is to: TBD  Anticipated d/c date is: > 3 days              Patient currently is not medically stable to d/c.   Consultants:  Vascular surgery   Procedures:  2D echo  Microbiology  None   Antimicrobials: Vancomycin / Cefepime 6/10 >>    Objective: Vitals:   06/30/19 0142 06/30/19 0335 06/30/19 0336 06/30/19 0747  BP: 124/71 140/69  120/64  Pulse: 86 98  85  Resp: 17 18  18   Temp: 98.5 F (36.9 C) (!) 97.4 F (36.3 C)  98.1 F (36.7 C)  TempSrc: Oral Oral  Oral  SpO2: 100% 98%  100%  Weight:   111.3 kg   Height:        Intake/Output Summary (Last 24 hours) at 06/30/2019 0930 Last  data filed at 06/30/2019 6712 Gross per 24 hour  Intake 240 ml  Output 875 ml  Net -635 ml   Filed Weights   06/28/19 1843 06/29/19 0329 06/30/19 0336  Weight: 111.7 kg 109.3 kg 111.3 kg    Examination:  Constitutional: NAD Eyes: No icterus ENMT: mmm Neck: normal, supple Respiratory: Clear on anterior auscultation, no wheezing, diminished at the bases Cardiovascular: Regular rate and rhythm, no murmurs, trace edema Abdomen: Soft, nontender, nondistended, bowel sounds positive Musculoskeletal: no clubbing / cyanosis.  Neurologic: No focal deficits   Data Reviewed: I have independently reviewed following labs and imaging studies   CBC: Recent Labs  Lab 06/27/19 0536 06/28/19 0446 06/30/19 0721  WBC 11.0* 8.4 7.2  NEUTROABS 8.8* 6.2  --   HGB 8.8* 8.8* 8.6*  HCT 33.7* 32.9* 32.5*  MCV 85.1 83.7 84.6  PLT 181 155 458   Basic Metabolic Panel: Recent Labs  Lab 06/27/19 0536 06/27/19 1044 06/28/19 0617 06/30/19 0721  NA 140 143 141 143  K 6.1* 5.5* 5.2* 4.3  CL 109 111 108 114*  CO2 23 22 22 22   GLUCOSE 139* 124* 90 126*  BUN 39* 38* 45* 40*  CREATININE 2.40* 2.25* 2.41* 2.33*  CALCIUM 8.2* 7.7* 8.1* 8.1*  MG  --  1.8 1.8 1.9  PHOS  --  4.6 4.6  --    Liver Function Tests: Recent Labs  Lab 06/27/19 0536 06/28/19 0617 06/30/19 0721  AST 30 24 19   ALT 16 14 12   ALKPHOS 218* 167* 168*  BILITOT 1.0 0.8 0.9  PROT 9.1* 8.0 8.4*  ALBUMIN 2.1* 1.9* 1.8*   Coagulation Profile: No results for input(s): INR, PROTIME in the last 168 hours. HbA1C: No results for input(s): HGBA1C in the last 72 hours. CBG: Recent Labs  Lab 06/29/19 0619 06/29/19 1216 06/29/19 1628 06/29/19 2152 06/30/19 0628  GLUCAP 119* 101* 109* 114* 128*    Recent Results (from the past 240 hour(s))  Blood culture (routine x 2)     Status: Abnormal   Collection Time: 06/27/19  5:46 AM   Specimen: BLOOD LEFT HAND  Result Value Ref Range Status   Specimen Description   Final     BLOOD LEFT HAND Performed at [facility], [place] [address], [place], [place] [address]    Special Requests   Final    BOTTLES DRAWN AEROBIC ONLY Blood Culture adequate volume Performed at [place] [address], [place], [place] [address]    Culture  Setup Time   Final    GRAM POSITIVE RODS AEROBIC BOTTLE ONLY CRITICAL RESULT CALLED TO, READ BACK BY AND VERIFIED WITH: [place] L POINDEXTER [id number]  AT 1016 BY CM    Culture (A)  Final    DIPHTHEROIDS(CORYNEBACTERIUM SPECIES) Standardized susceptibility testing for this organism is not available. Performed at [facility], [place] [address] [place], [place] [address]    Report Status 06/29/2019 FINAL  Final  SARS Coronavirus 2 by RT PCR (hospital order, performed in [facility] hospital lab) Nasopharyngeal Nasopharyngeal Swab     Status: None   Collection Time: 06/27/19  6:26 AM   Specimen: Nasopharyngeal Swab  Result Value Ref Range Status   SARS Coronavirus 2 NEGATIVE NEGATIVE Final    Comment: (NOTE) SARS-CoV-2 target nucleic acids are NOT DETECTED.  The SARS-CoV-2 RNA is generally detectable in upper and lower respiratory specimens during the acute phase of infection. The lowest concentration of SARS-CoV-2 viral copies this assay can detect is 250 copies / mL. A negative result does not preclude SARS-CoV-2 infection and should not be used as the sole basis for treatment or other patient management decisions.  A negative result may occur with improper specimen collection / handling, submission of specimen other than nasopharyngeal swab, presence of viral mutation(s) within the areas targeted by this assay, and inadequate number of viral copies (<250 copies / mL). A negative result must be combined with clinical observations, patient history, and epidemiological information.  Fact Sheet for Patients:   StrictlyIdeas.no  Fact Sheet for Healthcare  Providers: BankingDealers.co.za  This test is not yet approved or  cleared by the [place] FDA and has been authorized for detection and/or diagnosis of SARS-CoV-2 by FDA under an Emergency Use Authorization (EUA).  This EUA will remain in effect (meaning this test can be used) for the duration of the COVID-19 declaration under Section 564(b)(1) of the Act, 21 U.S.C. section 360bbb-3(b)(1), unless the authorization is terminated or revoked sooner.  Performed at [facility], [place] [address], [place], [place] [address]   Blood culture (routine x 2)     Status: None (Preliminary result)   Collection Time: 06/27/19  6:30 AM   Specimen: BLOOD  Result Value Ref Range Status   Specimen Description   Final    BLOOD BLOOD RIGHT FOREARM Performed at [place] [address] [place], [place] [address]    Special Requests   Final    BOTTLES DRAWN AEROBIC ONLY Blood Culture results may not be optimal due to an inadequate volume of blood received in culture bottles Performed at [place] [address] [place], [place] [address]    Culture   Final    NO GROWTH 3 DAYS Performed at [facility], [place] [address] [place], [place] [address]    Report Status PENDING  Incomplete  Urine culture     Status: None   Collection Time: 06/27/19 12:42 PM   Specimen: Urine, Random  Result Value Ref Range Status   Specimen Description   Final    URINE, RANDOM Performed at [place] [address] [place], [place] [address]    Special Requests   Final    NONE Performed at [facility], [place] [address] [place],  [address]    Culture   Final    NO GROWTH Performed at [facility], [place] [address], [place],  [address]    Report Status 06/28/2019 FINAL  Final     Radiology Studies: No results found. Marzetta Board, MD, PhD [facility]  Between 7 am - 7 pm I  am available, please contact me via Amion or Securechat  Between 7 pm - 7 am I am not available, please contact night coverage MD/APP via Amion

## 2019-06-30 NOTE — Progress Notes (Signed)
[facility]  Subjective  -sleepy but arousable.     Objective 120/64 85 98.1 F (36.7 C) (Oral) 18 100%  Intake/Output Summary (Last 24 hours) at 06/30/2019 1123 Last data filed at 06/30/2019 1034 Gross per 24 hour  Intake 360 ml  Output 875 ml  Net -515 ml    Right Foot        Laboratory Lab Results: Recent Labs    06/28/19 0446 06/30/19 0721  WBC 8.4 7.2  HGB 8.8* 8.6*  HCT 32.9* 32.5*  PLT 155 159   BMET Recent Labs    06/28/19 0617 06/30/19 0721  NA 141 143  K 5.2* 4.3  CL 108 114*  CO2 22 22  GLUCOSE 90 126*  BUN 45* 40*  CREATININE 2.41* 2.33*  CALCIUM 8.1* 8.1*    COAG Lab Results  Component Value Date   INR 1.6 (H) 05/30/2019   INR 1.7 (H) 05/27/2019   INR 1.2 05/07/2019   No results found for: PTT  Assessment/Planning:  64 year old female transferred from [facility] with extensive tissue loss in the right foot as pictured above (also left heel wound).  I agree with Dr. Claretha Cooper assessment that she likely needs above-knee amputation and even with revascularization would have no chance of healing full-thickness eschar on the ankle and dorsum of her foot.  She resides at a nursing center and I am not certain she walks frankly given her decubitus ulcer.     As noted, yesterday, I discussed with her husband plan for right above-knee amputation on Monday and later staged intervention on the left leg.  He stated he wanted to come and talk to his wife at hospital.  Waiting on him and family to make a decision.  Tentative right AKA tomorrow unless family declines.  Patient states she is amendable to proceed but question her overall mental status and understanding.  NPO after midnight.  [name] 06/30/2019 11:23 AM --

## 2019-06-30 NOTE — Progress Notes (Signed)
Discussed plans of right above-knee amputation tomorrow with patient's husband Amylah Will.  He came up to the hospital this morning and has spoken with his wife and does not want surgery tomorrow and wants her removed from the OR schedule.  He is under the impression that if we continue antibiotics her foot may have a chance of healing.  I discussed given my evaluation she has full-thickness eschar in multiple locations with breakdown and likely has no chance of healing.  Discussed without amputation certainly a risk that further infection and/or sepsis could develop.  I will cancel her case for tomorrow.  Dr. Donzetta Matters will take over care tomorrow since he initially evaluated her.  [name], MD [facility] Office: [place]

## 2019-07-01 ENCOUNTER — Encounter (HOSPITAL_COMMUNITY)
Admission: EM | Disposition: A | Discharge status: Skilled Nursing Facility | Payer: Self-pay | Source: Home / Self Care | Attending: Internal Medicine

## 2019-07-01 DIAGNOSIS — Z7189 Other specified counseling: Secondary | ICD-10-CM

## 2019-07-01 DIAGNOSIS — Z515 Encounter for palliative care: Secondary | ICD-10-CM

## 2019-07-01 DIAGNOSIS — L97208 Non-pressure chronic ulcer of unspecified calf with other specified severity: Secondary | ICD-10-CM

## 2019-07-01 DIAGNOSIS — I872 Venous insufficiency (chronic) (peripheral): Secondary | ICD-10-CM

## 2019-07-01 LAB — CBC
HCT: 32.8 % — ABNORMAL LOW (ref 36.0–46.0)
Hemoglobin: 8.6 g/dL — ABNORMAL LOW (ref 12.0–15.0)
MCH: 22.3 pg — ABNORMAL LOW (ref 26.0–34.0)
MCHC: 26.2 g/dL — ABNORMAL LOW (ref 30.0–36.0)
MCV: 85 fL (ref 80.0–100.0)
Platelets: 153 10*3/uL (ref 150–400)
RBC: 3.86 MIL/uL — ABNORMAL LOW (ref 3.87–5.11)
RDW: 26.5 % — ABNORMAL HIGH (ref 11.5–15.5)
WBC: 6.6 10*3/uL (ref 4.0–10.5)
nRBC: 0 % (ref 0.0–0.2)

## 2019-07-01 LAB — BASIC METABOLIC PANEL
Anion gap: 9 (ref 5–15)
BUN: 39 mg/dL — ABNORMAL HIGH (ref 8–23)
CO2: 20 mmol/L — ABNORMAL LOW (ref 22–32)
Calcium: 8.2 mg/dL — ABNORMAL LOW (ref 8.9–10.3)
Chloride: 115 mmol/L — ABNORMAL HIGH (ref 98–111)
Creatinine, Ser: 2.09 mg/dL — ABNORMAL HIGH (ref 0.44–1.00)
GFR calc Af Amer: 28 mL/min — ABNORMAL LOW (ref >60)
GFR calc non Af Amer: 24 mL/min — ABNORMAL LOW (ref >60)
Glucose, Bld: 90 mg/dL (ref 70–99)
Potassium: 4.4 mmol/L (ref 3.5–5.1)
Sodium: 144 mmol/L (ref 135–145)

## 2019-07-01 LAB — VANCOMYCIN, TROUGH: Vancomycin Tr: 37 ug/mL (ref 15–20)

## 2019-07-01 LAB — GLUCOSE, CAPILLARY
Glucose-Capillary: 93 mg/dL (ref 70–99)
Glucose-Capillary: 81 mg/dL (ref 70–99)
Glucose-Capillary: 100 mg/dL — ABNORMAL HIGH (ref 70–99)
Glucose-Capillary: 163 mg/dL — ABNORMAL HIGH (ref 70–99)

## 2019-07-01 LAB — PROTIME-INR
INR: 1.5 — ABNORMAL HIGH (ref 0.8–1.2)
Prothrombin Time: 17.4 seconds — ABNORMAL HIGH (ref 11.4–15.2)

## 2019-07-01 SURGERY — AMPUTATION, ABOVE KNEE
Anesthesia: General | Site: Knee | Laterality: Right

## 2019-07-01 MED ORDER — FUROSEMIDE 10 MG/ML IJ SOLN
40.0000 mg | Freq: Once | INTRAMUSCULAR | Status: AC
  Administered 2019-07-01: 40 mg via INTRAVENOUS
  Filled 2019-07-01: qty 4

## 2019-07-01 MED ORDER — FENTANYL CITRATE (PF) 100 MCG/2ML IJ SOLN
50.0000 ug | INTRAMUSCULAR | Status: AC | PRN
  Administered 2019-07-01: 50 ug via INTRAVENOUS
  Filled 2019-07-01: qty 2

## 2019-07-01 MED ORDER — HYDROCORTISONE 1 % EX CREA
TOPICAL_CREAM | Freq: Two times a day (BID) | CUTANEOUS | Status: DC
  Administered 2019-07-07 – 2019-07-17 (×5): 1 via TOPICAL
  Filled 2019-07-01 (×4): qty 28

## 2019-07-01 MED ORDER — VANCOMYCIN HCL IN DEXTROSE 1-5 GM/200ML-% IV SOLN
1000.0000 mg | INTRAVENOUS | Status: AC
  Administered 2019-07-02 – 2019-07-06 (×2): 1000 mg via INTRAVENOUS
  Filled 2019-07-01 (×3): qty 200

## 2019-07-01 MED ORDER — HYDROCORTISONE 1 % EX LOTN: TOPICAL_LOTION | Freq: Three times a day (TID) | CUTANEOUS | Status: DC

## 2019-07-01 MED ORDER — TRAMADOL HCL 50 MG PO TABS
25.0000 mg | ORAL_TABLET | Freq: Four times a day (QID) | ORAL | Status: DC | PRN
  Administered 2019-07-01: 25 mg via ORAL
  Filled 2019-07-01 (×2): qty 1

## 2019-07-01 NOTE — Progress Notes (Signed)
  Progress Note    07/01/2019 12:17 PM  Subjective:  Not interacting now  Vitals:   07/01/19 0812 07/01/19 1109  BP: (!) 145/73 138/83  Pulse:    Resp: 20 14  Temp: 97.7 F (36.5 C) 98.1 F (36.7 C)  SpO2: 100% 100%    Physical Exam: Not alert  Right foot ulcers with extensive full thickness wounds in 2 areas, dry heel ulcer  CBC    Component Value Date/Time   WBC 6.6 07/01/2019 0736   RBC 3.86 (L) 07/01/2019 0736   HGB 8.6 (L) 07/01/2019 0736   HGB 7.3 (L) 11/23/2017 1454   HCT 32.8 (L) 07/01/2019 0736   HCT 45.7 08/03/2013 2138   PLT 153 07/01/2019 0736   PLT 118 (L) 08/03/2013 2138   MCV 85.0 07/01/2019 0736   MCV 89 08/03/2013 2138   MCH 22.3 (L) 07/01/2019 0736   MCHC 26.2 (L) 07/01/2019 0736   RDW 26.5 (H) 07/01/2019 0736   RDW 13.6 08/03/2013 2138   LYMPHSABS 0.7 06/28/2019 0446   LYMPHSABS 3.4 08/03/2013 2138   MONOABS 1.2 (H) 06/28/2019 0446   MONOABS 0.7 08/03/2013 2138   EOSABS 0.2 06/28/2019 0446   EOSABS 0.3 08/03/2013 2138   BASOSABS 0.1 06/28/2019 0446   BASOSABS 0.1 08/03/2013 2138    BMET    Component Value Date/Time   NA 144 07/01/2019 0736   NA 139 08/03/2013 2138   K 4.4 07/01/2019 0736   K 3.4 (L) 08/03/2013 2138   CL 115 (H) 07/01/2019 0736   CL 104 08/03/2013 2138   CO2 20 (L) 07/01/2019 0736   CO2 27 08/03/2013 2138   GLUCOSE 90 07/01/2019 0736   GLUCOSE 153 (H) 08/03/2013 2138   BUN 39 (H) 07/01/2019 0736   BUN 16 08/03/2013 2138   CREATININE 2.09 (H) 07/01/2019 0736   CREATININE 1.19 08/03/2013 2138   CALCIUM 8.2 (L) 07/01/2019 0736   CALCIUM 9.2 08/03/2013 2138   GFRNONAA 24 (L) 07/01/2019 0736   GFRNONAA 50 (L) 08/03/2013 2138   GFRAA 28 (L) 07/01/2019 0736   GFRAA 58 (L) 08/03/2013 2138    INR    Component Value Date/Time   INR 1.5 (H) 07/01/2019 0736   INR 0.9 03/12/2012 1104     Intake/Output Summary (Last 24 hours) at 07/01/2019 1217 Last data filed at 07/01/2019 0900 Gross per 24 hour  Intake 100 ml    Output 900 ml  Net -800 ml     Assessment/plan:  64 y.o. female is here with extensive wounds on the right leg. She also has decubitus ulcer on her sacrum.  I discussed with her husband that I do not think that she has walked in many months unlikely to walk in the future and would be best served with right above-knee amputation.  At this time he is unwilling to consent he would like to talk with her thinks the best time for him would be Wednesday.  I discussed with him the likely need for palliative care evaluation given what appears to be a significant decline in her health since she was at home several months ago.  He is amenable to this and I have communicated with the primary team.  I will follow up with palliative care recommendations.   Dorthy Magnussen C. Donzetta Matters, MD [facility] Office: [phone] Pager: [phone]  07/01/2019 12:17 PM

## 2019-07-01 NOTE — Progress Notes (Signed)
Entered patient's room for rounding and med pass to find her more lethargic and less responsive than on assessment. Patient responded to voice with significant effort to rouse and would not answer orientation questions intelligibly. Notified charge nurse who then assessed the patient and associated documentation. Notified rapid response (see note). Patient is back on bipap. MD notified. No new orders at this time. Will continue to monitor respiratory status per rapid response.

## 2019-07-01 NOTE — Progress Notes (Signed)
Error in charting resulted in MEWS score of 2; though patient remains lethargic and difficult to rouse, she does respond to voice.

## 2019-07-01 NOTE — Consult Note (Signed)
Consultation Note Date: 07/01/2019   Patient Name: [name]  DOB: 01-23-1955  MRN: [id number]  Age / Sex: 64 y.o., female  PCP: [place] Referring Physician: Caren Griffins, MD  Reason for Consultation: Establishing goals of care  HPI/Patient Profile: 63 y.o. female  with past medical history of chronic systolic heart failure, COPD, DM2, DOE, OSA, Hep C, other cirrhosis, CKD III, lower extremity cellulitis, lower extremity venous stasis ulcers,  admitted on 06/27/2019 with complaints of altered mental status. Workup reveals metabolic encephalopathy in the setting of infectious process related to lower extremity cellulitis, myositis, osteoitis. Vascular surgery consulted and recommend BKA. Spouse initially has not consented for surgery. Palliative consulted for [place].     Clinical Assessment and Goals of Care: I evaluated the patient. She was awake, moaning. Nods yes to pain. Cannot verbalize where her pain is- only moans. Per nursing she has also been itching on her shoulders and back.  I called her spouse [name]. He tells me he and [name] lived together at home before she was sent to [facility]. He did come see her on Sunday and felt her mental status was better at that time then what I described to him.  I discussed with [name] the seriousness of [name]'s status. That she is likely to become septic and there is the possibility that she could die from her infections without amputation. Attempted to discuss implications of surgery vs allowing natural dying process in the context of patient's preferred goals of care. [name] stated he could not discuss these things until he was able to see [name] again. [name] has to arrange transportation due to being disabled himself and cannot come until Wednesday. Emotional support was given to [place].   Primary Decision Maker NEXT OF KIN- patients spouse-  [name]    SUMMARY OF RECOMMENDATIONS - [name] to call tomorrow with time to meet on Wednesday  -Low health literacy is a barrier- recommend plain straightforward communication with patient's spouse from all medical providers -Tramadol liquid 85m q6hr prn for pain -Hydrocortisone cream to itchy areas on shoulders and back BID  Code Status/Advance Care Planning:  Full code  Palliative Prophylaxis:   Delirium Protocol and Frequent Pain Assessment  Additional Recommendations (Limitations, Scope, Preferences):  Full Scope Treatment  Prognosis:    Unable to determine  Discharge Planning: To Be Determined  Primary Diagnoses: Present on Admission: . Chronic systolic CHF (congestive heart failure) ([place] . NSTEMI (non-ST elevated myocardial infarction) ([place] . CAD (coronary artery disease) . Hypertension . COPD (chronic obstructive pulmonary disease) ([facility] . Sleep apnea . Chronic hepatitis C without hepatic coma ([place] . Other cirrhosis of liver ([place] . Diabetes mellitus type 2, uncontrolled ([facility] . Anxiety . Chronic back pain . Venous stasis ulcer ([facility] . Acute metabolic encephalopathy   I have reviewed the medical record, interviewed the patient and family, and examined the patient. The following aspects are pertinent.  Past Medical History:  Diagnosis Date  . Cervical cancer ([place]   . CHF (congestive heart failure) (  HCC)   . Chronic anemia   . COPD (chronic obstructive pulmonary disease) ([place])   . Diabetes mellitus without complication ([place])   . Gastric ulcer   . NSTEMI (non-ST elevated myocardial infarction) ([place])   . Upper GI bleed    Social History   Socioeconomic History  . Marital status: Married    Spouse name: Not on file  . Number of children: Not on file  . Years of education: Not on file  . Highest education level: Not on file  Occupational History  . Not on file  Tobacco Use  . Smoking status: Current Some Day Smoker    Packs/day: 0.25     Types: Cigarettes  . Smokeless tobacco: Never Used  Vaping Use  . Vaping Use: Never used  Substance and Sexual Activity  . Alcohol use: No  . Drug use: No  . Sexual activity: Not on file  Other Topics Concern  . Not on file  Social History Narrative  . Not on file   Social Determinants of Health   Financial Resource Strain:   . Difficulty of Paying Living Expenses:   Food Insecurity:   . Worried About Charity fundraiser in the Last Year:   . Arboriculturist in the Last Year:   Transportation Needs:   . Film/video editor (Medical):   [name] Kitchen Lack of Transportation (Non-Medical):   Physical Activity:   . Days of Exercise per Week:   . Minutes of Exercise per Session:   Stress:   . Feeling of Stress :   Social Connections:   . Frequency of Communication with Friends and Family:   . Frequency of Social Gatherings with Friends and Family:   . Attends Religious Services:   . Active Member of Clubs or Organizations:   . Attends Archivist Meetings:   [name] Kitchen Marital Status:    Family History  Problem Relation Age of Onset  . Hypertension Mother   . Diabetes Mellitus II Mother   . Breast cancer Mother   . Heart disease Father   . Cancer Sister    Scheduled Meds: . atorvastatin  40 mg Oral Daily  . Chlorhexidine Gluconate Cloth  6 each Topical Daily  . feeding supplement (ENSURE ENLIVE)  237 mL Oral TID BM  . folic acid  1 mg Oral Daily  . heparin  5,000 Units Subcutaneous Q8H  . hydrocortisone cream   Topical BID  . insulin aspart  0-15 Units Subcutaneous TID WC  . insulin aspart  0-5 Units Subcutaneous QHS  . lactulose  20 g Oral TID  . LORazepam  2-4 mg Intravenous Once  . mirtazapine  30 mg Oral QHS  . multivitamin with minerals  1 tablet Oral Daily   Continuous Infusions: . ceFEPime (MAXIPIME) IV 2 g (07/01/19 1012)  . [START ON 07/02/2019] vancomycin     PRN Meds:.acetaminophen **OR** acetaminophen, traMADol Medications Prior to Admission:  Prior to  Admission medications   Medication Sig Start Date End Date Taking? Authorizing Provider  Amino Acids-Protein Hydrolys (FEEDING SUPPLEMENT, PRO-STAT SUGAR FREE 64,) LIQD Take 30 mLs by mouth 2 (two) times daily. Patient taking differently: Take 30 mLs by mouth 2 (two) times daily with a meal.  12/07/18  Yes Al Decant, MD  atorvastatin (LIPITOR) 40 MG tablet Take 40 mg by mouth daily.   Yes [provider]  dronabinol (MARINOL) 2.5 MG capsule Take 1 capsule (2.5 mg total) by mouth 2 (two) times daily before  lunch and supper. 06/05/19  Yes Lavina Hamman, MD  ferrous sulfate 325 (65 FE) MG tablet Take 1 tablet (325 mg total) by mouth 2 (two) times daily with a meal. 05/10/19  Yes Fritzi Mandes, MD  fluticasone (FLONASE) 50 MCG/ACT nasal spray Place 2 sprays into both nostrils daily.   Yes [provider]  folic acid (FOLVITE) 1 MG tablet Take 1 tablet (1 mg total) by mouth daily. 08/21/18  Yes Ojie, Jude, MD  furosemide (LASIX) 20 MG tablet Take 1 tablet (20 mg total) by mouth daily. 06/06/19  Yes Lavina Hamman, MD  hydrocerin (EUCERIN) CREA Apply 1 application topically 2 (two) times daily. 06/05/19  Yes Lavina Hamman, MD  HYDROcodone-acetaminophen (NORCO/VICODIN) 5-325 MG tablet Take 1 tablet by mouth every 6 (six) hours as needed for moderate pain.   Yes [provider]  lactulose (CHRONULAC) 10 GM/15ML solution Take 30 mLs (20 g total) by mouth 3 (three) times daily. 12/07/18  Yes Al Decant, MD  mirtazapine (REMERON) 30 MG tablet Take 30 mg by mouth at bedtime. 03/29/19  Yes [provider]  Multiple Vitamins-Minerals (CERTAVITE SENIOR PO) Take 1 tablet by mouth daily.   Yes [provider]  Nutritional Supplements (RESOURCE 2.0 PO) Take 1 Bottle by mouth 2 (two) times daily.   Yes [provider]  pregabalin (LYRICA) 75 MG capsule Take 1 capsule (75 mg total) by mouth 2 (two) times daily. 06/05/19  Yes Lavina Hamman, MD  collagenase  (SANTYL) ointment Apply topically daily. Patient not taking: Reported on 06/27/2019 05/10/19   Fritzi Mandes, MD  Multiple Vitamin (MULTIVITAMIN WITH MINERALS) TABS tablet Take 1 tablet by mouth daily. Patient not taking: Reported on 06/27/2019 06/28/18   Lang Snow, NP   Allergies  Allergen Reactions  . Latex Anaphylaxis and Rash  . Gabapentin Nausea And Vomiting and Other (See Comments)   Review of Systems  Unable to perform ROS: Acuity of condition    Physical Exam Vitals and nursing note reviewed.  Constitutional:      Appearance: She is ill-appearing.  Cardiovascular:     Pulses: Normal pulses.  Pulmonary:     Effort: Pulmonary effort is normal.  Musculoskeletal:     Comments: Bilateral LE edema, erythema with ulcerations, skin peeling  Neurological:     Comments: Lethargic, nonverbal     Vital Signs: BP 123/63 (BP Location: Right Arm)   Pulse 79   Temp 98 F (36.7 C) (Oral)   Resp 14   Ht 5' 5"  (1.651 m)   Wt 110.1 kg   SpO2 98%   BMI 40.39 kg/m  Pain Scale: Faces POSS *See Group Information*: S-Acceptable,Sleep, easy to arouse Pain Score: 0-No pain   SpO2: SpO2: 98 % O2 Device:SpO2: 98 % O2 Flow Rate: .O2 Flow Rate (L/min): 2 L/min  IO: Intake/output summary:   Intake/Output Summary (Last 24 hours) at 07/01/2019 1738 Last data filed at 07/01/2019 1736 Gross per 24 hour  Intake 460 ml  Output 1175 ml  Net -715 ml    LBM: Last BM Date: 06/30/19 Baseline Weight: Weight: 115 kg Most recent weight: Weight: 110.1 kg     Palliative Assessment/Data: PPS: 20%     Thank you for this consult. Palliative medicine will continue to follow and assist as needed.   Time In: 1600 Time Out: 1730 Time Total: 90 minutes Greater than 50%  of this time was spent counseling and coordinating care related to the above assessment and plan.  Signed by: Mariana Kaufman, AGNP-C Palliative Medicine    Please contact Palliative Medicine Team phone at [phone] for  questions and concerns.  For individual provider: See Shea Evans

## 2019-07-01 NOTE — Significant Event (Signed)
Rapid Response Event Note  Overview:  Called by RN with concern that pt is increasingly more lethargic. Work up has been started for encephalopathy including MRI head today which was negative.      Initial Focused Assessment: On arrival, pt laying in bed with occasional moaning noted. Pt arouses to painful stimuli and opens eyes. Unable to get her to speak other than moaning at times. HR 86, BP 139/74, RR 16, spO2 100% on 2L [place]. Lungs diminished throughout. R pupil noted to be 30m nonreactive and L +2 and sluggish (nothing found in chart documenting pupil size or prior eye surgeries).   Interventions: Primary team notified of assessment and pupil findings PIV started  Pt placed on Bipap by RT  Plan of Care (if not transferred):  Per primary team, will continue to monitor pt status since MRI today was clear and VSS.   Monitor pt neuro status and arousability along with vital signs. RN instructed to call with any changes or concerns.   Event Summary:   Called  at  2200   Event ended at  [address]

## 2019-07-01 NOTE — Progress Notes (Signed)
Came to room to eval for prn bipap order. Noted MD note earlier re: pt more lethargic today.  I spoke w/ RN, and he has spoken w/ rapid response prior this evening.  VSS, no distress noted, HR 80, sat 100%.  Pt was placed on bipap d/t increased lethargy today, RN in room and aware. Pt appears tol tol bipap well currently.

## 2019-07-01 NOTE — Plan of Care (Signed)
  Problem: Education: Goal: Knowledge of General Education information will improve Description Including pain rating scale, medication(s)/side effects and non-pharmacologic comfort measures Outcome: Progressing   

## 2019-07-01 NOTE — Progress Notes (Signed)
PROGRESS NOTE  [name]:[id number] DOB: 04-15-1955 DOA: 06/27/2019 PCP: [place] [facility]   LOS: 4 days   Brief Narrative / Interim history: 64 year old female with chronic systolic CHF, DM 2, uncontrolled and with complications,OSA, chronic hep C with liver cirrhosis, chronic lower extremity venous ulcers, CKD stage IIIb, chronic pain was admitted to the hospital on 6/10 with acute metabolic encephalopathy as well as hypoxia.  She does not wear oxygen usually.  She has been having bilateral lower extremity ulcers /  cellulitis and recently completed a course of Augmentin on June 8.  She was recently hospitalized in May 21 at [facility] for group C strep bacteremia.  She underwent a TEE at that time due to prosthetic aortic valve and endocarditis has been ruled out.  Subjective / 24h Interval events: No complaints this morning, she is sleepy which appears to be close to her baseline at least here in the hospital.  Assessment & Plan: Principal Problem Acute on chronic venous stasis ulcers/right foot abscess/left leg cellulitis -likely the main issue driving her encephalopathy.  An MRI of the right foot showed marked dorsal soft tissue swelling with subcutaneous edema with areas suggesting wounds/ulcerations, with a multiloculated fluid collection concerning for abscess.  Left foot MRI subtle bone edema however felt not to represent osteomyelitis but osteitis.  Patient was transferred from [facility] long hospital to [facility] for vascular surgery consultation.  Appreciate follow-up, plan was for surgery today as he has now declined an endoscopic vascular surgery, Dr. Donzetta Matters to follow-up today -Keep on broad-spectrum antibiotics with vancomycin and cefepime -I will also consult palliative care  Active Problems Acute metabolic encephalopathy-in the setting of infectious process, also hypoxia and hypercapnia.  Improving, maintaining good sats, and she was on BiPAP orally during this admission.   Her mental status seems to be fluctuating, yesterday she was poorly responsive last night and complained of chest pain.  Underwent work-up with an ABG which were essentially unremarkable, EKG and high-sensitivity troponins were negative, underwent an MRI of the brain which was negative for acute findings.  Suspect smoldering infection is a contributing process  Acute hypoxic and hypercarbic respiratory failure-ABGs on admission reviewed, mild respiratory acidosis, uncompensated.  She required BiPAP.  Continue to closely monitor.  Respiratory status stable  Acute on chronic systolic [id number] echo was done this admission which showed septal dyskinesis consistent with RBBB.  LVEF was 40-45% without WMA.  Echo also showed grade 2 DD.  Chest x-ray on admission showed cardiomegaly, layering pleural effusions consistent with fluid overload.  She was diuresed with Lasix, net -975 mL today, weights improved from 253 pounds on admission to 245 today.  She has received 4 doses of Lasix in the first day of admission and has been held since.  Kidney function has remained stable, she seems slightly fluid overloaded, will give Lasix today  Acute kidney injury on chronic kidney disease stage IIIb -Baseline creatinine 1.7-1.9, increased up to 2.4 on 6/12 and now 2.0, close to baseline.  Will monitor as she is getting Lasix today  Positive blood cultures-1/4 bottles, GPRs, diphtheroids.  This is likely contaminant.  Hyperkalemia -6.1 on admission, improved and stable, 4.4 this morning  Hypoalbuminemia -Likely complication of malnourishment in addition to history of cirrhosis.  History of hepatic encephalopathy -Normal ammonia on admission. Continue Lactulose  Anemia -Chronic. Likely secondary to chronic disease. Most recent iron studies suggests possible combination of iron deficiency and chronic disease. Patient is on iron supplementation as an outpatient. Stable.  Chronic hepatitis C Cirrhosis  Diabetes  mellitus, type 2 -Last hemoglobin A1C of 7.4%. SSI while inpatient, CBGs well-controlled  CBG (last 3)  Recent Labs    06/30/19 1608 06/30/19 2126 07/01/19 0628  GLUCAP 118* 103* 93     Pressure injury -Right anterior foot, left heel, right medial foot. POA.  Morbid obesity -Body mass index is 41.31 kg/m.  Pressure Injury 05/05/19 Thigh Distal;Left;Posterior Unstageable - Full thickness tissue loss in which the base of the injury is covered by slough (yellow, tan, gray, green or brown) and/or eschar (tan, brown or black) in the wound bed. 4"x4" area with m (Active)  05/05/19 0700  Location: Thigh  Location Orientation: Distal;Left;Posterior  Staging: Unstageable - Full thickness tissue loss in which the base of the injury is covered by slough (yellow, tan, gray, green or brown) and/or eschar (tan, brown or black) in the wound bed.  Wound Description (Comments): 4"x4" area with mixture of pink granulated wound bed and yellow slough.  Present on Admission: Yes     Pressure Injury 06/27/19 Foot Anterior;Right Unstageable - Full thickness tissue loss in which the base of the injury is covered by slough (yellow, tan, gray, green or brown) and/or eschar (tan, brown or black) in the wound bed. (Active)  06/27/19 2040  Location: Foot  Location Orientation: Anterior;Right  Staging: Unstageable - Full thickness tissue loss in which the base of the injury is covered by slough (yellow, tan, gray, green or brown) and/or eschar (tan, brown or black) in the wound bed.  Wound Description (Comments):   Present on Admission: Yes     Pressure Injury 06/27/19 Heel Left Deep Tissue Pressure Injury - Purple or maroon localized area of discolored intact skin or blood-filled blister due to damage of underlying soft tissue from pressure and/or shear. (Active)  06/27/19 2040  Location: Heel  Location Orientation: Left  Staging: Deep Tissue Pressure Injury - Purple or maroon localized area of discolored  intact skin or blood-filled blister due to damage of underlying soft tissue from pressure and/or shear.  Wound Description (Comments):   Present on Admission:      Pressure Injury 06/27/19 Foot Right;Medial Unstageable - Full thickness tissue loss in which the base of the injury is covered by slough (yellow, tan, gray, green or brown) and/or eschar (tan, brown or black) in the wound bed. (Active)  06/27/19 2040  Location: Foot  Location Orientation: Right;Medial  Staging: Unstageable - Full thickness tissue loss in which the base of the injury is covered by slough (yellow, tan, gray, green or brown) and/or eschar (tan, brown or black) in the wound bed.  Wound Description (Comments):   Present on Admission:     Scheduled Meds:  atorvastatin  40 mg Oral Daily   Chlorhexidine Gluconate Cloth  6 each Topical Daily   feeding supplement (ENSURE ENLIVE)  237 mL Oral TID BM   folic acid  1 mg Oral Daily   furosemide  40 mg Intravenous Once   heparin  5,000 Units Subcutaneous Q8H   insulin aspart  0-15 Units Subcutaneous TID WC   insulin aspart  0-5 Units Subcutaneous QHS   lactulose  20 g Oral TID   LORazepam  2-4 mg Intravenous Once   mirtazapine  30 mg Oral QHS   multivitamin with minerals  1 tablet Oral Daily   Continuous Infusions:  ceFEPime (MAXIPIME) IV 2 g (06/30/19 2245)   vancomycin 1,250 mg (06/30/19 1027)   PRN Meds:.acetaminophen **OR** acetaminophen  DVT prophylaxis:  heparin Code Status: Full code Family Communication: no family at bedside  Status is: Inpatient  Remains inpatient appropriate because: Persistent encephalopathy in the setting of active infection for which family is in surgery.  Vascular surgery to discuss again today, if continues to refuse will involve palliative care  Dispo: The patient is from: Home              Anticipated d/c is to: TBD              Anticipated d/c date is: > 3 days              Patient currently is not medically  stable to d/c.   Consultants:  Vascular surgery   Procedures:  2D echo  Microbiology  None   Antimicrobials: Vancomycin / Cefepime 6/10 >>    Objective: Vitals:   07/01/19 0034 07/01/19 0308 07/01/19 0318 07/01/19 0812  BP: 122/69  118/63 (!) 145/73  Pulse: 81 79    Resp: 15 (!) 28 20 20   Temp: 98.7 F (37.1 C)  98.6 F (37 C) 97.7 F (36.5 C)  TempSrc:   Oral Oral  SpO2: 100% 100% 100% 100%  Weight:   110.1 kg   Height:        Intake/Output Summary (Last 24 hours) at 07/01/2019 0913 Last data filed at 07/01/2019 0546 Gross per 24 hour  Intake 220 ml  Output 1075 ml  Net -855 ml   Filed Weights   06/29/19 0329 06/30/19 0336 07/01/19 0318  Weight: 109.3 kg 111.3 kg 110.1 kg    Examination:  Constitutional: No distress Eyes: No scleral icterus ENMT: mmm Neck: normal, supple Respiratory: Faint bibasilar crackles, no wheezing, moves air well Cardiovascular: Regular rate and rhythm, no murmurs, trace edema Abdomen: Soft, NT, ND, bowel sounds positive Musculoskeletal: no clubbing / cyanosis.  Neurologic: Nonfocal   Data Reviewed: I have independently reviewed following labs and imaging studies   CBC: Recent Labs  Lab 06/27/19 0536 06/28/19 0446 06/30/19 0721 07/01/19 0736  WBC 11.0* 8.4 7.2 6.6  NEUTROABS 8.8* 6.2  --   --   HGB 8.8* 8.8* 8.6* 8.6*  HCT 33.7* 32.9* 32.5* 32.8*  MCV 85.1 83.7 84.6 85.0  PLT 181 155 159 850   Basic Metabolic Panel: Recent Labs  Lab 06/27/19 0536 06/27/19 1044 06/28/19 0617 06/30/19 0721 07/01/19 0736  NA 140 143 141 143 144  K 6.1* 5.5* 5.2* 4.3 4.4  CL 109 111 108 114* 115*  CO2 23 22 22 22  20*  GLUCOSE 139* 124* 90 126* 90  BUN 39* 38* 45* 40* 39*  CREATININE 2.40* 2.25* 2.41* 2.33* 2.09*  CALCIUM 8.2* 7.7* 8.1* 8.1* 8.2*  MG  --  1.8 1.8 1.9  --   PHOS  --  4.6 4.6  --   --    Liver Function Tests: Recent Labs  Lab 06/27/19 0536 06/28/19 0617 06/30/19 0721  AST 30 24 19   ALT 16 14 12     ALKPHOS 218* 167* 168*  BILITOT 1.0 0.8 0.9  PROT 9.1* 8.0 8.4*  ALBUMIN 2.1* 1.9* 1.8*   Coagulation Profile: Recent Labs  Lab 07/01/19 0736  INR 1.5*   HbA1C: No results for input(s): HGBA1C in the last 72 hours. CBG: Recent Labs  Lab 06/30/19 0628 06/30/19 1250 06/30/19 1608 06/30/19 2126 07/01/19 0628  GLUCAP 128* 177* 118* 103* 93    Recent Results (from the past 240 hour(s))  Blood culture (routine x 2)  Status: Abnormal   Collection Time: 06/27/19  5:46 AM   Specimen: BLOOD LEFT HAND  Result Value Ref Range Status   Specimen Description   Final    BLOOD LEFT HAND Performed at [facility] [address], [place], [place] [address]    Special Requests   Final    BOTTLES DRAWN AEROBIC ONLY Blood Culture adequate volume Performed at [facility] [address] [place], [place] [address]    Culture  Setup Time   Final    GRAM POSITIVE RODS AEROBIC BOTTLE ONLY CRITICAL RESULT CALLED TO, READ BACK BY AND VERIFIED WITH: PHARMD L POINDEXTER [id number] AT 1016 BY CM    Culture (A)  Final    DIPHTHEROIDS(CORYNEBACTERIUM SPECIES) Standardized susceptibility testing for this organism is not available. Performed at [facility], [place] [address], [place], [place] [address]    Report Status 06/29/2019 FINAL  Final  SARS Coronavirus 2 by RT PCR (hospital order, performed in [facility] hospital lab) Nasopharyngeal Nasopharyngeal Swab     Status: None   Collection Time: 06/27/19  6:26 AM   Specimen: Nasopharyngeal Swab  Result Value Ref Range Status   SARS Coronavirus 2 NEGATIVE NEGATIVE Final    Comment: (NOTE) SARS-CoV-2 target nucleic acids are NOT DETECTED.  The SARS-CoV-2 RNA is generally detectable in upper and lower respiratory specimens during the acute phase of infection. The lowest concentration of SARS-CoV-2 viral copies this assay can detect is 250 copies / mL. A negative result does not preclude SARS-CoV-2  infection and should not be used as the sole basis for treatment or other patient management decisions.  A negative result may occur with improper specimen collection / handling, submission of specimen other than nasopharyngeal swab, presence of viral mutation(s) within the areas targeted by this assay, and inadequate number of viral copies (<250 copies / mL). A negative result must be combined with clinical observations, patient history, and epidemiological information.  Fact Sheet for Patients:   StrictlyIdeas.no  Fact Sheet for Healthcare Providers: BankingDealers.co.za  This test is not yet approved or  cleared by the [place] FDA and has been authorized for detection and/or diagnosis of SARS-CoV-2 by FDA under an Emergency Use Authorization (EUA).  This EUA will remain in effect (meaning this test can be used) for the duration of the COVID-19 declaration under Section 564(b)(1) of the Act, 21 U.S.C. section 360bbb-3(b)(1), unless the authorization is terminated or revoked sooner.  Performed at [facility], [place] [address], [place], [place] [address]   Blood culture (routine x 2)     Status: None (Preliminary result)   Collection Time: 06/27/19  6:30 AM   Specimen: BLOOD  Result Value Ref Range Status   Specimen Description   Final    BLOOD BLOOD RIGHT FOREARM Performed at [facility] [address] [place], [place] [address]    Special Requests   Final    BOTTLES DRAWN AEROBIC ONLY Blood Culture results may not be optimal due to an inadequate volume of blood received in culture bottles Performed at [facility] [address] [place], [place] [address]    Culture   Final    NO GROWTH 3 DAYS Performed at [facility], [place] [address] [place], [place] [address]    Report Status PENDING  Incomplete  Urine culture     Status: None   Collection Time:  06/27/19 12:42 PM   Specimen: Urine, Random  Result Value Ref Range Status   Specimen Description   Final    URINE, RANDOM Performed at [facility] [address], [place], [place] [address]    Special Requests   Final    NONE Performed at [facility], [place] [address], [place], [place] [address]    Culture   Final    NO GROWTH Performed at [facility], [place] [address], [place], [place] [address]    Report Status 06/28/2019 FINAL  Final     Radiology Studies: MR BRAIN WO CONTRAST  Result Date: 06/30/2019 CLINICAL DATA:  Encephalopathy EXAM: MRI HEAD WITHOUT CONTRAST TECHNIQUE: Multiplanar, multiecho pulse sequences of the brain and surrounding structures were obtained without intravenous contrast. COMPARISON:  None. FINDINGS: Brain: No acute infarct, acute hemorrhage or extra-axial collection. Early confluent hyperintense T2-weighted signal of the periventricular and deep white matter, most commonly due to chronic ischemic microangiopathy. Normal volume of CSF spaces. No chronic microhemorrhage. Normal midline structures. Vascular: Normal flow voids. Skull and upper cervical spine: Normal marrow signal. Sinuses/Orbits: Small amount of mastoid fluid bilaterally. No paranasal sinus fluid level. Orbits are normal. Other: None IMPRESSION: 1. No acute intracranial abnormality. 2. Findings of chronic ischemic microangiopathy. Electronically Signed   By: Ulyses Jarred M.D.   On: 06/30/2019 19:25   DG CHEST PORT 1 VIEW  Result Date: 06/30/2019 CLINICAL DATA:  Dyspnea EXAM: PORTABLE CHEST 1 VIEW COMPARISON:  06/28/2019 FINDINGS: Cardiac shadow remains enlarged. Postsurgical changes are again seen. Increasing vascular congestion is noted when compare with the prior exam with increasing edema. Some patchy airspace opacity is noted in the right base consistent with atelectasis. No bony abnormality is noted. IMPRESSION: Increasing CHF with right basilar  atelectasis. Electronically Signed   By: Inez Catalina M.D.   On: 06/30/2019 21:32   Marzetta Board, MD, PhD [facility]  Between 7 am - 7 pm I am available, please contact me via Amion or Securechat  Between 7 pm - 7 am I am not available, please contact night coverage MD/APP via Amion

## 2019-07-01 NOTE — Progress Notes (Signed)
Pharmacy Antibiotic Note  [name] is a 64 y.o. female with bilateral  LE wounds presented the ED on 06/27/2019 with AMS and hypoxia. She was started on vancomycin and cefepime for acute on chronic venous statis, right foot abscess and left leg cellulitis.  Ongoing discussion for right BKA.  Renal function is improving and vancomycin trough is supra-therapeutic at 37 mcg/mL (goal ~15 mcg/mL).  Afebrile, WBC WNL.  6/10 right tibia MRI: no osteo  6/10 left foot MRI: no osteo, reactive osteitis 6/10 right foot MRI: no osteo, ?abscess 6/11 L/RLE MRI no osteo, no abscess   Plan: Reduce vanc to 1gm IV Q48H Continue cefepime 2gm IV Q12H Monitor renal fxn, clinical progress, PRN vanc trough F/u Vascular and palliative care recommendations  Height: 5' 5"  (165.1 cm) Weight: 110.1 kg (242 lb 11.6 oz) IBW/kg (Calculated) : 57  Temp (24hrs), Avg:98.3 F (36.8 C), Min:97.7 F (36.5 C), Max:98.7 F (37.1 C)  Recent Labs  Lab 06/27/19 0536 06/27/19 0541 06/27/19 1044 06/27/19 1402 06/28/19 0446 06/28/19 0617 06/30/19 0721 07/01/19 0736 07/01/19 1100  WBC 11.0*  --   --   --  8.4  --  7.2 6.6  --   CREATININE 2.40*  --  2.25*  --   --  2.41* 2.33* 2.09*  --   LATICACIDVEN  --  1.5 1.8 2.0*  --   --   --   --   --   VANCOTROUGH  --   --   --   --   --   --   --   --  37*    Estimated Creatinine Clearance: 33.6 mL/min (A) (by C-G formula based on SCr of 2.09 mg/dL (H)).    Allergies  Allergen Reactions   Latex Anaphylaxis and Rash   Gabapentin Nausea And Vomiting and Other (See Comments)    Vanc 6/10 >> Cefepime 6/10 >>  6/14 VT = 37 mcg/mL on 1255m q24 >> 1g q48h (SCr 2.09)  6/10 BCx - 1/4 with Corynebacter, likely a contaminant 6/10 UCx - negative 6/10 MRSA PCR - negative May 2021 - GCS bacteremia   Catherene Kaleta D. DMina Marble PharmD, BCPS, B[place]6/14/2021, 12:23 PM

## 2019-07-01 NOTE — Progress Notes (Signed)
Informed day shift nurse of responsibility to obtain order to continue foley as it was placed on 6/11.

## 2019-07-01 NOTE — Progress Notes (Signed)
Lab called with Lucianne Lei level of 16,  texted Dr Gloris Ham and made him aware.  He did repsond with a thank you and pharmacy will know what to do

## 2019-07-01 NOTE — Progress Notes (Signed)
Palliative-  Brief note- full note to follow.   Evaluated patient. She was lethargic, but moaning- nodded yes to pain.  Called patient's spouse [name]- he is planning on coming to see [name] on Wednesday due to transportation issues. He cannot make any decisions regarding her care until he sees her on Wednesday.   Plan-  Tramadol liquid 79m q6hr prn pain Hydrocortisone cream- to shoulders/back BID for itching  KMariana Kaufman AGNP-C Palliative Medicine  No charge note

## 2019-07-01 NOTE — Progress Notes (Signed)
°   07/01/19 0308  Assess: MEWS Score  Pulse Rate 79  Resp (!) 28  SpO2 100 %  Assess: MEWS Score  MEWS Temp 0  MEWS Systolic 0  MEWS Pulse 0  MEWS RR 2  MEWS LOC 1  MEWS Score 3  MEWS Score Color Yellow  Assess: if the MEWS score is Yellow or Red  Were vital signs taken at a resting state? Yes  Focused Assessment Documented focused assessment  Early Detection of Sepsis Score *See Row Information* Medium  MEWS guidelines implemented *See Row Information* No, vital signs rechecked  Treat  MEWS Interventions Escalated (See documentation below)  Escalate  MEWS: Escalate Yellow: discuss with charge nurse/RN and consider discussing with provider and RRT  Notify: Charge Nurse/RN  Name of Charge Nurse/RN Notified Alisha RN  Date Charge Nurse/RN Notified 07/01/19  Time Charge Nurse/RN Notified 0322  Document  Patient Outcome Other (Comment) (no intervention necessary)  Progress note created (see row info) Yes  MEWS score 3 due to respirations and LOC. Assessed patient and obtained new set of vital signs; though LOC remains altered, manual respiration count returned value of 20. Will continue to monitor for signs of respiratory distress, but MEWS protocol is not activated at this time.

## 2019-07-01 NOTE — TOC Progression Note (Signed)
Transition of Care [facility]) - Progression Note    Patient Details  Name: LYDIA TOREN MRN: [id number] Date of Birth: 1955-03-03  Transition of [facility] CM/SW [place], [place] Phone Number: 07/01/2019, 9:46 AM  Clinical Narrative:    CSW spoke with Freda Munro of [facility] and confirmed patient is a long-term resident and able to return once medically stable. CSW will continue to follow and assist with any discharge planning needs.   Expected Discharge Plan: Long Term Nursing Home Barriers to Discharge: Continued Medical Work up  Expected Discharge Plan and Services Expected Discharge Plan: [place]       Living arrangements for the past 2 months: [place] Expected Discharge Date:  (unknown)                                     Social Determinants of Health (SDOH) Interventions    Readmission Risk Interventions Readmission Risk Prevention Plan 05/28/2019 02/26/2019 12/17/2018  Transportation Screening Complete Complete Complete  PCP or Specialist Appt within 5-7 Days - - -  Home Care Screening - - -  Medication Review (RN CM) - - -  HRI or [place] Work Consult for [place] Planning/Counseling - - -  SW consult not completed comments - - -  Palliative Care Screening - - -  Medication Review Press photographer) Complete Complete Referral to Pharmacy  PCP or Specialist appointment within 3-5 days of discharge - (No Data) Not Complete  PCP/Specialist Appt Not Complete comments - - DC date unknown but pt is established with providers  [place] or [place] Complete Complete Not Complete  HRI or Home Care Consult Pt Refusal Comments - - SNF resident  SW Recovery Care/Counseling Consult Complete - Complete  Palliative Care Screening Not Applicable Not Applicable Not Complete  Comments - - pending need  [place] Complete Patient Refused Complete  Some recent data might be hidden

## 2019-07-02 ENCOUNTER — Ambulatory Visit: Payer: Medicaid Other | Admitting: Physician Assistant

## 2019-07-02 DIAGNOSIS — Z7189 Other specified counseling: Secondary | ICD-10-CM

## 2019-07-02 LAB — CBC
HCT: 34.6 % — ABNORMAL LOW (ref 36.0–46.0)
Hemoglobin: 9.1 g/dL — ABNORMAL LOW (ref 12.0–15.0)
MCH: 22.2 pg — ABNORMAL LOW (ref 26.0–34.0)
MCHC: 26.3 g/dL — ABNORMAL LOW (ref 30.0–36.0)
MCV: 84.4 fL (ref 80.0–100.0)
Platelets: 140 10*3/uL — ABNORMAL LOW (ref 150–400)
RBC: 4.1 MIL/uL (ref 3.87–5.11)
RDW: 26.1 % — ABNORMAL HIGH (ref 11.5–15.5)
WBC: 9 10*3/uL (ref 4.0–10.5)
nRBC: 0 % (ref 0.0–0.2)

## 2019-07-02 LAB — GLUCOSE, CAPILLARY
Glucose-Capillary: 117 mg/dL — ABNORMAL HIGH (ref 70–99)
Glucose-Capillary: 98 mg/dL (ref 70–99)
Glucose-Capillary: 114 mg/dL — ABNORMAL HIGH (ref 70–99)
Glucose-Capillary: 128 mg/dL — ABNORMAL HIGH (ref 70–99)

## 2019-07-02 LAB — CULTURE, BLOOD (ROUTINE X 2): Culture: NO GROWTH

## 2019-07-02 LAB — BASIC METABOLIC PANEL
Anion gap: 7 (ref 5–15)
BUN: 37 mg/dL — ABNORMAL HIGH (ref 8–23)
CO2: 22 mmol/L (ref 22–32)
Calcium: 8.5 mg/dL — ABNORMAL LOW (ref 8.9–10.3)
Chloride: 116 mmol/L — ABNORMAL HIGH (ref 98–111)
Creatinine, Ser: 2 mg/dL — ABNORMAL HIGH (ref 0.44–1.00)
GFR calc Af Amer: 30 mL/min — ABNORMAL LOW (ref >60)
GFR calc non Af Amer: 26 mL/min — ABNORMAL LOW (ref >60)
Glucose, Bld: 119 mg/dL — ABNORMAL HIGH (ref 70–99)
Potassium: 4.5 mmol/L (ref 3.5–5.1)
Sodium: 145 mmol/L (ref 135–145)

## 2019-07-02 MED ORDER — FENTANYL CITRATE (PF) 100 MCG/2ML IJ SOLN
50.0000 ug | INTRAMUSCULAR | Status: AC | PRN
  Administered 2019-07-03 (×2): 50 ug via INTRAVENOUS
  Filled 2019-07-02 (×2): qty 2

## 2019-07-02 MED ORDER — FUROSEMIDE 10 MG/ML IJ SOLN
40.0000 mg | Freq: Once | INTRAMUSCULAR | Status: AC
  Administered 2019-07-02: 40 mg via INTRAVENOUS
  Filled 2019-07-02: qty 4

## 2019-07-02 MED ORDER — SODIUM CHLORIDE 0.9 % IV SOLN
INTRAVENOUS | Status: DC | PRN
  Administered 2019-07-02 – 2019-07-03 (×4): 250 mL via INTRAVENOUS
  Administered 2019-07-10: 500 mL via INTRAVENOUS

## 2019-07-02 NOTE — Progress Notes (Signed)
This RN received patient on BiPAP, no safety mitts or wrist restraints present. Patient is lethargic and is sleeping while on BiPAP, not attempting to pull at IV or other medical devices at this time. MD at bedside. This RN spoke with respiratory therapist, states to leave patient on BiPAP until level of conscience improves and patient becomes more alert thank current- RT states she will come back to check patient. This RN did not apply nonviolent restraints at this time. Safety mitts applied.

## 2019-07-02 NOTE — Progress Notes (Addendum)
Nutrition Follow-up  DOCUMENTATION CODES:   Obesity unspecified  INTERVENTION:   -Downgrade diet to dysphagia 3 (advanced mechanical soft) for ease of intake -Continue Ensure Enlive po TID, each supplement provides 350 kcal and 20 grams of protein -Continue MVI with minerals daily -Magic cup BID with meals, each supplement provides 290 kcal and 9 grams of protein -RD to provide further recommendations based upon goals of care  NUTRITION DIAGNOSIS:   Increased nutrient needs related to wound healing as evidenced by estimated needs.  Ongoing  GOAL:   Patient will meet greater than or equal to 90% of their needs  Unmet  MONITOR:   PO intake, Supplement acceptance, Weight trends, Labs, I & O's, Skin  REASON FOR ASSESSMENT:   Consult Assessment of nutrition requirement/status  ASSESSMENT:   Patient with PMH significant for CHF, NSTEMI, COPD, DM, gastric ulcer, chronic hepatitis C, cirrhosis of liver, and CKD III. Presents this admission with acute exacerbation CHF and bilateral lower extremity wounds.  Reviewed I/O's: -1.8 L x 24 hours and -4.2 L since admission  UOP: 2.2 L x 24 hours  Case discussed with RN, who reports that pt has been increasingly lethargic over the past few days. Pt now requiring bi-pap. Agitation improved this AM. Pt moved head towards RD when name was called and when examining her, but was not alert enough to carry on conversation or answer questions.   Due to mental status, meal completion very poor; PO: 0-25%. Pt is currently on a soft diet, which is a GI soft (low fiber) diet designed for patients with GI conditions and/or flare-ups and for patients who recently underwent GI surgery. Due to AMS, pt would better benefit from a mechanically altered diet for ease of intake.   Per discussion with RN, confirmed that pt and family have refused leg amputation. Palliative care meeting scheduled for tomorrow (07/03/19) for further goals of care discussions.    Medications reviewed and include folvite, lactulose, ativan, and remeron.   Labs reviewed: CBGS: 81-163 (inpatient orders for glycemic control are 0-5 units insulin aspart q HS).   NUTRITION - FOCUSED PHYSICAL EXAM:    Most Recent Value  Orbital Region No depletion  Upper Arm Region Mild depletion  Thoracic and Lumbar Region No depletion  Buccal Region No depletion  Temple Region No depletion  Clavicle Bone Region No depletion  Clavicle and Acromion Bone Region No depletion  Scapular Bone Region No depletion  Dorsal Hand No depletion  Patellar Region No depletion  Anterior Thigh Region No depletion  Posterior Calf Region No depletion  Edema (RD Assessment) Moderate  Hair Reviewed  Eyes Reviewed  Mouth Reviewed  Skin Reviewed  Nails Reviewed       Diet Order:   Diet Order            DIET DYS 3 Room service appropriate? No; Fluid consistency: Thin  Diet effective now                 EDUCATION NEEDS:   Not appropriate for education at this time  Skin:  Skin Assessment: Skin Integrity Issues: Skin Integrity Issues:: Other (Comment), DTI, Unstageable DTI: L heel Unstageable: L thigh, R foot Other: bilateral L/R pretibial wounds  Last BM:  07/01/19  Height:   Ht Readings from Last 1 Encounters:  06/28/19 5' 5"  (1.651 m)    Weight:   Wt Readings from Last 1 Encounters:  07/02/19 108.4 kg   BMI:  Body mass index is 39.76 kg/m.  Estimated Nutritional Needs:   Kcal:  1900-2100 kcal  Protein:  100-115 grams  Fluid:  >/= 1.9 L/day    Loistine Chance, RD, LDN, CDCES Registered Dietitian II Certified Diabetes Care and Education Specialist Please refer to [facility] for RD and/or RD on-call/weekend/after hours pager

## 2019-07-02 NOTE — Progress Notes (Signed)
PROGRESS NOTE  [name] OEV:[id number] DOB: 1955/07/30 DOA: 06/27/2019 PCP: [facility]   LOS: 5 days   Brief Narrative / Interim history: 64 year old female with chronic systolic CHF, DM 2, uncontrolled and with complications,OSA, chronic hep C with liver cirrhosis, chronic lower extremity venous ulcers, CKD stage IIIb, chronic pain was admitted to the hospital on 6/10 with acute metabolic encephalopathy as well as hypoxia.  She does not wear oxygen usually.  She has been having bilateral lower extremity ulcers /  cellulitis and recently completed a course of Augmentin on June 8.  She was recently hospitalized in May 21 at [facility] for group C strep bacteremia.  She underwent a TEE at that time due to prosthetic aortic valve and endocarditis has been ruled out.  Patient was initially admitted to [facility] long hospital and transferred to [facility] for right BKA, but after arrival husband, who makes medical decisions, is refusing to consent for surgery.  Palliative consulted.  Subjective / 24h Interval events: Has intermittent confusion throughout the night.  Assessment & Plan: Principal Problem Acute on chronic venous stasis ulcers/right foot abscess/left leg cellulitis -likely the main issue driving her encephalopathy.  An MRI of the right foot showed marked dorsal soft tissue swelling with subcutaneous edema with areas suggesting wounds/ulcerations, with a multiloculated fluid collection concerning for abscess.  Left foot MRI subtle bone edema however felt not to represent osteomyelitis but osteitis.  Patient was transferred from [facility] long hospital to [facility] for vascular surgery consultation.  Appreciate follow-up, plan was for surgery on Monday however husband has now declined surgery.  I have discussed with Dr. Donzetta Matters, he recommends palliative care consultation since her prognosis is quite guarded.  Keep on broad-spectrum antibiotics for now.  Active Problems Acute metabolic  encephalopathy-in the setting of infectious process, also hypoxia and hypercapnia.  Improving, maintaining good sats, and she was on BiPAP initially during this admission.  Her mental status seems to be fluctuating, on Sunday she was poorly responsive and complained of chest pain.  Underwent work-up with an ABG which were essentially unremarkable, EKG and high-sensitivity troponins were negative, underwent an MRI of the brain which was negative for acute findings.  Suspect smoldering infection is a contributing process  Acute hypoxic and hypercarbic respiratory failure-ABGs on admission reviewed, mild respiratory acidosis, uncompensated.  She required BiPAP.  Continue to closely monitor.  Respiratory status stable.  Acute on chronic systolic WEX-9B echo was done this admission which showed septal dyskinesis consistent with RBBB.  LVEF was 40-45% without WMA.  Echo also showed grade 2 DD.  Chest x-ray on admission showed cardiomegaly, layering pleural effusions consistent with fluid overload.  She was diuresed with Lasix, initially but this was held due to increasing creatinine.  Weight on admission was 253 pounds, is 238 today.  Received Lasix on 6/14, redose today.  Kidney function stable  Acute kidney injury on chronic kidney disease stage IIIb -Baseline creatinine 1.7-1.9, increased up to 2.4 on 6/12 and now 2.0, close to baseline.  Redose Lasix x1 today  Positive blood cultures-1/4 bottles, GPRs, diphtheroids.  This is likely contaminant.  Hyperkalemia -6.1 on admission, improved and stable, 4.4 this morning  Hypoalbuminemia -Likely complication of malnourishment in addition to history of cirrhosis.  History of hepatic encephalopathy -Normal ammonia on admission. Continue Lactulose  Anemia -Chronic. Likely secondary to chronic disease. Most recent iron studies suggests possible combination of iron deficiency and chronic disease. Patient is on iron supplementation as an outpatient.  Stable.  Chronic hepatitis C Cirrhosis  Diabetes mellitus, type 2 -Last hemoglobin A1C of 7.4%. SSI while inpatient, CBGs well-controlled  CBG (last 3)  Recent Labs    07/01/19 1618 07/01/19 2114 07/02/19 0614  GLUCAP 100* 163* 117*     Pressure injury -Right anterior foot, left heel, right medial foot. POA.  Morbid obesity -Body mass index is 41.31 kg/m.  Pressure Injury 05/05/19 Thigh Distal;Left;Posterior Unstageable - Full thickness tissue loss in which the base of the injury is covered by slough (yellow, tan, gray, green or brown) and/or eschar (tan, brown or black) in the wound bed. 4"x4" area with m (Active)  05/05/19 0700  Location: Thigh  Location Orientation: Distal;Left;Posterior  Staging: Unstageable - Full thickness tissue loss in which the base of the injury is covered by slough (yellow, tan, gray, green or brown) and/or eschar (tan, brown or black) in the wound bed.  Wound Description (Comments): 4"x4" area with mixture of pink granulated wound bed and yellow slough.  Present on Admission: Yes     Pressure Injury 06/27/19 Foot Anterior;Right Unstageable - Full thickness tissue loss in which the base of the injury is covered by slough (yellow, tan, gray, green or brown) and/or eschar (tan, brown or black) in the wound bed. (Active)  06/27/19 2040  Location: Foot  Location Orientation: Anterior;Right  Staging: Unstageable - Full thickness tissue loss in which the base of the injury is covered by slough (yellow, tan, gray, green or brown) and/or eschar (tan, brown or black) in the wound bed.  Wound Description (Comments):   Present on Admission: Yes     Pressure Injury 06/27/19 Heel Left Deep Tissue Pressure Injury - Purple or maroon localized area of discolored intact skin or blood-filled blister due to damage of underlying soft tissue from pressure and/or shear. (Active)  06/27/19 2040  Location: Heel  Location Orientation: Left  Staging: Deep Tissue  Pressure Injury - Purple or maroon localized area of discolored intact skin or blood-filled blister due to damage of underlying soft tissue from pressure and/or shear.  Wound Description (Comments):   Present on Admission:      Pressure Injury 06/27/19 Foot Right;Medial Unstageable - Full thickness tissue loss in which the base of the injury is covered by slough (yellow, tan, gray, green or brown) and/or eschar (tan, brown or black) in the wound bed. (Active)  06/27/19 2040  Location: Foot  Location Orientation: Right;Medial  Staging: Unstageable - Full thickness tissue loss in which the base of the injury is covered by slough (yellow, tan, gray, green or brown) and/or eschar (tan, brown or black) in the wound bed.  Wound Description (Comments):   Present on Admission:     Scheduled Meds: . atorvastatin  40 mg Oral Daily  . Chlorhexidine Gluconate Cloth  6 each Topical Daily  . feeding supplement (ENSURE ENLIVE)  237 mL Oral TID BM  . folic acid  1 mg Oral Daily  . heparin  5,000 Units Subcutaneous Q8H  . hydrocortisone cream   Topical BID  . insulin aspart  0-15 Units Subcutaneous TID WC  . insulin aspart  0-5 Units Subcutaneous QHS  . lactulose  20 g Oral TID  . LORazepam  2-4 mg Intravenous Once  . mirtazapine  30 mg Oral QHS  . multivitamin with minerals  1 tablet Oral Daily   Continuous Infusions: . ceFEPime (MAXIPIME) IV 2 g (07/01/19 2341)  . vancomycin     PRN Meds:.acetaminophen **OR** acetaminophen, fentaNYL (SUBLIMAZE) injection, traMADol  DVT  prophylaxis: heparin Code Status: Full code Family Communication: no family at bedside  Status is: Inpatient  Remains inpatient appropriate because: Persistent encephalopathy in the setting of active infection.  Husband persistently refusing surgery, palliative care consult pending  Dispo: The patient is from: Home              Anticipated d/c is to: TBD              Anticipated d/c date is: > 3 days              Patient  currently is not medically stable to d/c.   Consultants:  Vascular surgery   Procedures:  2D echo  Microbiology  None   Antimicrobials: Vancomycin / Cefepime 6/10 >>    Objective: Vitals:   07/01/19 1923 07/02/19 0111 07/02/19 0300 07/02/19 0827  BP: 140/80 (!) 161/86 (!) 159/85   Pulse: 87 98 88 83  Resp: 16 17 15  (!) 22  Temp: 97.9 F (36.6 C) 98.5 F (36.9 C) 98.5 F (36.9 C)   TempSrc: Oral Oral Oral   SpO2: 98%  91%   Weight:  108.4 kg    Height:        Intake/Output Summary (Last 24 hours) at 07/02/2019 0903 Last data filed at 07/02/2019 0515 Gross per 24 hour  Intake 700 ml  Output 2175 ml  Net -1475 ml   Filed Weights   06/30/19 0336 07/01/19 0318 07/02/19 0111  Weight: 111.3 kg 110.1 kg 108.4 kg    Examination:  Constitutional: BiPAP on, does not appear to be in distress wakes up easily. Eyes: No scleral icterus ENMT: Moist mucous membranes Neck: normal, supple Respiratory: Faint bibasilar crackles, no wheezing, moves air well along with the BiPAP Cardiovascular: Regular rate and rhythm, no murmurs, trace edema Abdomen: Soft, NT, ND, bowel sounds positive Musculoskeletal: no clubbing / cyanosis.  Right lower extremity dressing CDI, did not take dressing off, please refer to pictures and vascular surgery notes Neurologic: Nonfocal, does not follow commands consistently but moves all 4   Data Reviewed: I have independently reviewed following labs and imaging studies   CBC: Recent Labs  Lab 06/27/19 0536 06/28/19 0446 06/30/19 0721 07/01/19 0736  WBC 11.0* 8.4 7.2 6.6  NEUTROABS 8.8* 6.2  --   --   HGB 8.8* 8.8* 8.6* 8.6*  HCT 33.7* 32.9* 32.5* 32.8*  MCV 85.1 83.7 84.6 85.0  PLT 181 155 159 400   Basic Metabolic Panel: Recent Labs  Lab 06/27/19 0536 06/27/19 1044 06/28/19 0617 06/30/19 0721 07/01/19 0736  NA 140 143 141 143 144  K 6.1* 5.5* 5.2* 4.3 4.4  CL 109 111 108 114* 115*  CO2 23 22 22 22  20*  GLUCOSE 139* 124* 90 126*  90  BUN 39* 38* 45* 40* 39*  CREATININE 2.40* 2.25* 2.41* 2.33* 2.09*  CALCIUM 8.2* 7.7* 8.1* 8.1* 8.2*  MG  --  1.8 1.8 1.9  --   PHOS  --  4.6 4.6  --   --    Liver Function Tests: Recent Labs  Lab 06/27/19 0536 06/28/19 0617 06/30/19 0721  AST 30 24 19   ALT 16 14 12   ALKPHOS 218* 167* 168*  BILITOT 1.0 0.8 0.9  PROT 9.1* 8.0 8.4*  ALBUMIN 2.1* 1.9* 1.8*   Coagulation Profile: Recent Labs  Lab 07/01/19 0736  INR 1.5*   HbA1C: No results for input(s): HGBA1C in the last 72 hours. CBG: Recent Labs  Lab 07/01/19 0628 07/01/19 1108 07/01/19  1618 07/01/19 2114 07/02/19 0614  GLUCAP 93 81 100* 163* 117*    Recent Results (from the past 240 hour(s))  Blood culture (routine x 2)     Status: Abnormal   Collection Time: 06/27/19  5:46 AM   Specimen: BLOOD LEFT HAND  Result Value Ref Range Status   Specimen Description   Final    BLOOD LEFT HAND Performed at [facility] [address], [place], [place] [address]    Special Requests   Final    BOTTLES DRAWN AEROBIC ONLY Blood Culture adequate volume Performed at [facility] [address] [place], [place] [address]    Culture  Setup Time   Final    GRAM POSITIVE RODS AEROBIC BOTTLE ONLY CRITICAL RESULT CALLED TO, READ BACK BY AND VERIFIED WITH: PHARMD L POINDEXTER [id number] AT 1016 BY CM    Culture (A)  Final    DIPHTHEROIDS(CORYNEBACTERIUM SPECIES) Standardized susceptibility testing for this organism is not available. Performed at [facility], [place] [address] [place], [place] [address]    Report Status 06/29/2019 FINAL  Final  SARS Coronavirus 2 by RT PCR (hospital order, performed in [facility] hospital lab) Nasopharyngeal Nasopharyngeal Swab     Status: None   Collection Time: 06/27/19  6:26 AM   Specimen: Nasopharyngeal Swab  Result Value Ref Range Status   SARS Coronavirus 2 NEGATIVE NEGATIVE Final    Comment: (NOTE) SARS-CoV-2 target nucleic acids are  NOT DETECTED.  The SARS-CoV-2 RNA is generally detectable in upper and lower respiratory specimens during the acute phase of infection. The lowest concentration of SARS-CoV-2 viral copies this assay can detect is 250 copies / mL. A negative result does not preclude SARS-CoV-2 infection and should not be used as the sole basis for treatment or other patient management decisions.  A negative result may occur with improper specimen collection / handling, submission of specimen other than nasopharyngeal swab, presence of viral mutation(s) within the areas targeted by this assay, and inadequate number of viral copies (<250 copies / mL). A negative result must be combined with clinical observations, patient history, and epidemiological information.  Fact Sheet for Patients:   StrictlyIdeas.no  Fact Sheet for Healthcare Providers: BankingDealers.co.za  This test is not yet approved or  cleared by the [place] FDA and has been authorized for detection and/or diagnosis of SARS-CoV-2 by FDA under an Emergency Use Authorization (EUA).  This EUA will remain in effect (meaning this test can be used) for the duration of the COVID-19 declaration under Section 564(b)(1) of the Act, 21 U.S.C. section 360bbb-3(b)(1), unless the authorization is terminated or revoked sooner.  Performed at [facility], [place] [address] [place], [place] [address]   Blood culture (routine x 2)     Status: None (Preliminary result)   Collection Time: 06/27/19  6:30 AM   Specimen: BLOOD  Result Value Ref Range Status   Specimen Description   Final    BLOOD BLOOD RIGHT FOREARM Performed at [facility] [address], [place], [place] [address]    Special Requests   Final    BOTTLES DRAWN AEROBIC ONLY Blood Culture results may not be optimal due to an inadequate volume of blood received in culture bottles Performed at [facility] [address] [place], [place] [address]    Culture   Final    NO GROWTH 4 DAYS Performed at [facility], [place] [address]  [place], [place] [address]    Report Status PENDING  Incomplete  Urine culture     Status: None   Collection Time: 06/27/19 12:42 PM   Specimen: Urine, Random  Result Value Ref Range Status   Specimen Description   Final    URINE, RANDOM Performed at [facility] [address] [place], [place] [address]    Special Requests   Final    NONE Performed at [facility], [place] [address] [place], [place] [address]    Culture   Final    NO GROWTH Performed at [facility], [place] [address] [place],  [address]    Report Status 06/28/2019 FINAL  Final     Radiology Studies: No results found. Marzetta Board, MD, PhD [facility]  Between 7 am - 7 pm I am available, please contact me via Amion or Securechat  Between 7 pm - 7 am I am not available, please contact night coverage MD/APP via Amion

## 2019-07-02 NOTE — Progress Notes (Signed)
Per IV team, they are approaching or at the maximum allotment of IV insertions. Viable veins are found only in her hands which precludes the use of safety mittens. MD notified.

## 2019-07-02 NOTE — Progress Notes (Signed)
Lab was unable to collect enough blood for AM labs due "poor access". Per representative, will attempt again when next on floor.

## 2019-07-02 NOTE — Progress Notes (Addendum)
Patient more alert than morning shift assessment, responding to voice and opening eyes, able to nod to answer "yes" or "no" questions. BiPAP was removed and patient placed on nasal canula 4 liters, current oxygen saturation is 100%. Morning oral medications given, patient tolerated well. Will continue to monitor vital signs and oxygen saturation. Safety mitts remain on patient to prevent patient from pulling medical devices- not on restraints.

## 2019-07-03 DIAGNOSIS — E872 Acidosis: Secondary | ICD-10-CM

## 2019-07-03 DIAGNOSIS — E875 Hyperkalemia: Secondary | ICD-10-CM

## 2019-07-03 DIAGNOSIS — E8729 Other acidosis: Secondary | ICD-10-CM

## 2019-07-03 LAB — GLUCOSE, CAPILLARY
Glucose-Capillary: 109 mg/dL — ABNORMAL HIGH (ref 70–99)
Glucose-Capillary: 110 mg/dL — ABNORMAL HIGH (ref 70–99)
Glucose-Capillary: 103 mg/dL — ABNORMAL HIGH (ref 70–99)
Glucose-Capillary: 113 mg/dL — ABNORMAL HIGH (ref 70–99)

## 2019-07-03 LAB — BASIC METABOLIC PANEL
Anion gap: 10 (ref 5–15)
BUN: 36 mg/dL — ABNORMAL HIGH (ref 8–23)
CO2: 21 mmol/L — ABNORMAL LOW (ref 22–32)
Calcium: 8.4 mg/dL — ABNORMAL LOW (ref 8.9–10.3)
Chloride: 112 mmol/L — ABNORMAL HIGH (ref 98–111)
Creatinine, Ser: 2 mg/dL — ABNORMAL HIGH (ref 0.44–1.00)
GFR calc Af Amer: 30 mL/min — ABNORMAL LOW (ref >60)
GFR calc non Af Amer: 26 mL/min — ABNORMAL LOW (ref >60)
Glucose, Bld: 116 mg/dL — ABNORMAL HIGH (ref 70–99)
Potassium: 4.2 mmol/L (ref 3.5–5.1)
Sodium: 143 mmol/L (ref 135–145)

## 2019-07-03 LAB — CBC
HCT: 31.4 % — ABNORMAL LOW (ref 36.0–46.0)
Hemoglobin: 8.3 g/dL — ABNORMAL LOW (ref 12.0–15.0)
MCH: 22.6 pg — ABNORMAL LOW (ref 26.0–34.0)
MCHC: 26.4 g/dL — ABNORMAL LOW (ref 30.0–36.0)
MCV: 85.6 fL (ref 80.0–100.0)
Platelets: 136 10*3/uL — ABNORMAL LOW (ref 150–400)
RBC: 3.67 MIL/uL — ABNORMAL LOW (ref 3.87–5.11)
RDW: 26.3 % — ABNORMAL HIGH (ref 11.5–15.5)
WBC: 8 10*3/uL (ref 4.0–10.5)
nRBC: 0 % (ref 0.0–0.2)

## 2019-07-03 NOTE — Progress Notes (Signed)
PROGRESS NOTE    [name]  FBP:[id number] DOB: 05/31/1955 DOA: 06/27/2019 PCP: [place]    Brief Narrative: 64 year old female admitted from [facility] facility with change in mental status.  At baseline she is able to carry a conversation and is oriented though she is bedbound.  Patient was initially admitted to [facility] long and transferred to [facility] for amputation.  However till today the husband had refused amputation however he he has agreed for amputation today 07/03/2019 Patient has chronic longstanding comorbidities including chronic systolic heart failure, type 2 diabetes, gastric ulcer, hepatitis C, CKD stage IIIb, history of MI COPD. Patient admitted to [facility] recently with group C strep bacteremia treated with antibiotics at that time transesophageal echo showed no evidence of endocarditis.  Assessment & Plan:   Active Problems:   NSTEMI (non-ST elevated myocardial infarction) (HCC)   COPD (chronic obstructive pulmonary disease) (HCC)   Anxiety   CAD (coronary artery disease)   Chronic back pain   Chronic hepatitis C without hepatic coma (HCC)   Diabetes mellitus type 2, uncontrolled (HCC)   Hypertension   Sleep apnea   Other cirrhosis of liver (HCC)   Acute metabolic encephalopathy   Type 2 diabetes mellitus with stage 3b chronic kidney disease, with long-term current use of insulin (HCC)   Chronic systolic CHF (congestive heart failure) (HCC)   Venous stasis ulcer ([place])   Advanced care planning/counseling discussion   #1  Right foot abscess/chronic venous stasis ulcers/left leg cellulitis MRI evidence of multiloculated fluid collection concerning for abscess in the right foot.  Will reconsult vascular surgery since has been had consented to surgery today.  Continue broad-spectrum antibiotics for now.  #2  Acute metabolic encephalopathy in the setting of infection and hypoxia and hypercapnia.  She was awake when I saw her today but was  moaning and groaning without following any commands or conversation.  MRI of the brain negative for any acute findings.  #3 acute on chronic systolic CHF ejection fraction 40 to 45% without wall motion abnormalities.  She was initially treated with IV diuretics which was on hold due to increasing creatinine.  She is getting Lasix as needed.  She is on 2 L satting 98%. Negative by 4780.  #4 AKI on CKD stage IIIb baseline creatinine 1.7-1.9.  Creatinine 2.0 same as yesterday.  Use as needed Lasix.  #5 history of hepatic encephalopathy on lactulose  #6 history of chronic anemia hemoglobin 8.3  #7 hepatitis C/cirrhosis follow  #8 type 2 diabetes A1c 7.4%  CBG (last 3)  Recent Labs    07/02/19 2138 07/03/19 0627 07/03/19 1148  GLUCAP 128* 109* 110*    #9 pressure injury to the right foot left heel and right medial foot present on admission as below.   Pressure Injury 06/27/19 Foot Anterior;Right Unstageable - Full thickness tissue loss in which the base of the injury is covered by slough (yellow, tan, gray, green or brown) and/or eschar (tan, brown or black) in the wound bed. (Active)  06/27/19 2040  Location: Foot  Location Orientation: Anterior;Right  Staging: Unstageable - Full thickness tissue loss in which the base of the injury is covered by slough (yellow, tan, gray, green or brown) and/or eschar (tan, brown or black) in the wound bed.  Wound Description (Comments):   Present on Admission: Yes     Pressure Injury 06/27/19 Heel Left Deep Tissue Pressure Injury - Purple or maroon localized area of discolored intact skin or blood-filled blister  due to damage of underlying soft tissue from pressure and/or shear. (Active)  06/27/19 2040  Location: Heel  Location Orientation: Left  Staging: Deep Tissue Pressure Injury - Purple or maroon localized area of discolored intact skin or blood-filled blister due to damage of underlying soft tissue from pressure and/or shear.  Wound  Description (Comments):   Present on Admission:      Pressure Injury 06/27/19 Foot Right;Medial Unstageable - Full thickness tissue loss in which the base of the injury is covered by slough (yellow, tan, gray, green or brown) and/or eschar (tan, brown or black) in the wound bed. (Active)  06/27/19 2040  Location: Foot  Location Orientation: Right;Medial  Staging: Unstageable - Full thickness tissue loss in which the base of the injury is covered by slough (yellow, tan, gray, green or brown) and/or eschar (tan, brown or black) in the wound bed.  Wound Description (Comments):   Present on Admission:      Pressure Injury 07/03/19 Thigh Left;Medial Stage 2 -  Partial thickness loss of dermis presenting as a shallow open injury with a red, pink wound bed without slough. Medical Device (Active)  07/03/19 1136  Location: Thigh  Location Orientation: Left;Medial  Staging: Stage 2 -  Partial thickness loss of dermis presenting as a shallow open injury with a red, pink wound bed without slough.  Wound Description (Comments): Medical Device  Present on Admission: No     Pressure Injury 07/03/19 Thigh Right;Medial Stage 2 -  Partial thickness loss of dermis presenting as a shallow open injury with a red, pink wound bed without slough. medical device (Active)  07/03/19 1137  Location: Thigh  Location Orientation: Right;Medial  Staging: Stage 2 -  Partial thickness loss of dermis presenting as a shallow open injury with a red, pink wound bed without slough.  Wound Description (Comments): medical device  Present on Admission: No      Nutrition Problem: Increased nutrient needs Etiology: wound healing     Signs/Symptoms: estimated needs    Interventions: Ensure Enlive (each supplement provides 350kcal and 20 grams of protein), MVI  Estimated body mass index is 40.36 kg/m as calculated from the following:   Height as of this encounter: 5' 5"  (1.651 m).   Weight as of this encounter: 110  kg.  DVT prophylaxis: Heparin Code Status: Full code Family Communication: None at bedside Disposition Plan:  Status is: Inpatient  Dispo: The patient is from: Nursing home              Anticipated d/c is to: Nursing home              Anticipated d/c date is: Unknown              Patient currently is not medically stable to d/c.  Admitted with right foot cellulitis now with abscess with acute metabolic encephalopathy   Consultants: Palliative care and vascular surgery  Procedures: None Antimicrobials: Anti-infectives (From admission, onward)   Start     Dose/Rate Route Frequency Ordered Stop   07/02/19 1200  vancomycin (VANCOCIN) IVPB 1000 mg/200 mL premix     Discontinue     1,000 mg 200 mL/hr over 60 Minutes Intravenous Every 48 hours 07/01/19 1228     06/28/19 1000  vancomycin (VANCOREADY) IVPB 1250 mg/250 mL  Status:  Discontinued        1,250 mg 166.7 mL/hr over 90 Minutes Intravenous Every 24 hours 06/27/19 1223 07/01/19 1213   06/27/19 1300  ceFEPIme (MAXIPIME) 2  g in sodium chloride 0.9 % 100 mL IVPB     Discontinue     2 g 200 mL/hr over 30 Minutes Intravenous Every 12 hours 06/27/19 1223     06/27/19 1230  vancomycin (VANCOREADY) IVPB 2000 mg/400 mL        2,000 mg 200 mL/hr over 120 Minutes Intravenous  Once 06/27/19 1223 06/27/19 1743       Subjective:  Patient in bed moaning and groaning Objective: Does not follow any commands Vitals:   07/03/19 0312 07/03/19 0700 07/03/19 0800 07/03/19 1150  BP:  136/85 (!) 141/75   Pulse:  89  93  Resp: 19 13 17    Temp:  98.1 F (36.7 C)  98.1 F (36.7 C)  TempSrc:  Oral  Oral  SpO2: 94% 99% 98%   Weight:      Height:        Intake/Output Summary (Last 24 hours) at 07/03/2019 1441 Last data filed at 07/03/2019 1300 Gross per 24 hour  Intake 465.21 ml  Output 1026 ml  Net -560.79 ml   Filed Weights   07/01/19 0318 07/02/19 0111 07/03/19 0027  Weight: 110.1 kg 108.4 kg 110 kg    Examination:  General exam:  Appears calm and comfortable  Respiratory system: Clear to auscultation. Respiratory effort normal. Cardiovascular system: S1 & S2 heard, RRR. No JVD, murmurs, rubs, gallops or clicks. No pedal edema. Gastrointestinal system: Abdomen is nondistended, soft and nontender. No organomegaly or masses felt. Normal bowel sounds heard. Central nervous system: Alert and oriented. No focal neurological deficits. Extremities: Both foot covered with Ace wrap Skin: No rashes, lesions or ulcers Psychiatry: Judgement and insight appear normal. Mood & affect appropriate.     Data Reviewed: I have personally reviewed following labs and imaging studies  CBC: Recent Labs  Lab 06/27/19 0536 06/27/19 0536 06/28/19 0446 06/30/19 0721 07/01/19 0736 07/02/19 0827 07/03/19 0448  WBC 11.0*   < > 8.4 7.2 6.6 9.0 8.0  NEUTROABS 8.8*  --  6.2  --   --   --   --   HGB 8.8*   < > 8.8* 8.6* 8.6* 9.1* 8.3*  HCT 33.7*   < > 32.9* 32.5* 32.8* 34.6* 31.4*  MCV 85.1   < > 83.7 84.6 85.0 84.4 85.6  PLT 181   < > 155 159 153 140* 136*   < > = values in this interval not displayed.   Basic Metabolic Panel: Recent Labs  Lab 06/27/19 1044 06/27/19 1044 06/28/19 0617 06/30/19 0721 07/01/19 0736 07/02/19 0827 07/03/19 0448  NA 143   < > 141 143 144 145 143  K 5.5*   < > 5.2* 4.3 4.4 4.5 4.2  CL 111   < > 108 114* 115* 116* 112*  CO2 22   < > 22 22 20* 22 21*  GLUCOSE 124*   < > 90 126* 90 119* 116*  BUN 38*   < > 45* 40* 39* 37* 36*  CREATININE 2.25*   < > 2.41* 2.33* 2.09* 2.00* 2.00*  CALCIUM 7.7*   < > 8.1* 8.1* 8.2* 8.5* 8.4*  MG 1.8  --  1.8 1.9  --   --   --   PHOS 4.6  --  4.6  --   --   --   --    < > = values in this interval not displayed.   GFR: Estimated Creatinine Clearance: 35.1 mL/min (A) (by C-G formula based on SCr of 2  mg/dL (H)). Liver Function Tests: Recent Labs  Lab 06/27/19 0536 06/28/19 0617 06/30/19 0721  AST 30 24 19   ALT 16 14 12   ALKPHOS 218* 167* 168*  BILITOT 1.0 0.8  0.9  PROT 9.1* 8.0 8.4*  ALBUMIN 2.1* 1.9* 1.8*   No results for input(s): LIPASE, AMYLASE in the last 168 hours. Recent Labs  Lab 06/27/19 0659 06/29/19 1425  AMMONIA 35 40*   Coagulation Profile: Recent Labs  Lab 07/01/19 0736  INR 1.5*   Cardiac Enzymes: No results for input(s): CKTOTAL, CKMB, CKMBINDEX, TROPONINI in the last 168 hours. BNP (last 3 results) No results for input(s): PROBNP in the last 8760 hours. HbA1C: No results for input(s): HGBA1C in the last 72 hours. CBG: Recent Labs  Lab 07/02/19 1247 07/02/19 1600 07/02/19 2138 07/03/19 0627 07/03/19 1148  GLUCAP 98 114* 128* 109* 110*   Lipid Profile: No results for input(s): CHOL, HDL, LDLCALC, TRIG, CHOLHDL, LDLDIRECT in the last 72 hours. Thyroid Function Tests: No results for input(s): TSH, T4TOTAL, FREET4, T3FREE, THYROIDAB in the last 72 hours. Anemia Panel: No results for input(s): VITAMINB12, FOLATE, FERRITIN, TIBC, IRON, RETICCTPCT in the last 72 hours. Sepsis Labs: Recent Labs  Lab 06/27/19 0541 06/27/19 1044 06/27/19 1402  LATICACIDVEN 1.5 1.8 2.0*    Recent Results (from the past 240 hour(s))  Blood culture (routine x 2)     Status: Abnormal   Collection Time: 06/27/19  5:46 AM   Specimen: BLOOD LEFT HAND  Result Value Ref Range Status   Specimen Description   Final    BLOOD LEFT HAND Performed at [facility] [address], [place],  [address]    Special Requests   Final    BOTTLES DRAWN AEROBIC ONLY Blood Culture adequate volume Performed at [facility] [address], [place], [place] [address]    Culture  Setup Time   Final    GRAM POSITIVE RODS AEROBIC BOTTLE ONLY CRITICAL RESULT CALLED TO, READ BACK BY AND VERIFIED WITH: PHARMD L POINDEXTER [id number] AT 1016 BY CM    Culture (A)  Final    DIPHTHEROIDS(CORYNEBACTERIUM SPECIES) Standardized susceptibility testing for this organism is not available. Performed at [facility], [place] [address] [place],  [address]    Report Status 06/29/2019 FINAL  Final  SARS Coronavirus 2 by RT PCR (hospital order, performed in [facility] hospital lab) Nasopharyngeal Nasopharyngeal Swab     Status: None   Collection Time: 06/27/19  6:26 AM   Specimen: Nasopharyngeal Swab  Result Value Ref Range Status   SARS Coronavirus 2 NEGATIVE NEGATIVE Final    Comment: (NOTE) SARS-CoV-2 target nucleic acids are NOT DETECTED.  The SARS-CoV-2 RNA is generally detectable in upper and lower respiratory specimens during the acute phase of infection. The lowest concentration of SARS-CoV-2 viral copies this assay can detect is 250 copies / mL. A negative result does not preclude SARS-CoV-2 infection and should not be used as the sole basis for treatment or other patient management decisions.  A negative result may occur with improper specimen collection / handling, submission of specimen other than nasopharyngeal swab, presence of viral mutation(s) within the areas targeted by this assay, and inadequate number of viral copies (<250 copies / mL). A negative result must be combined with clinical observations, patient history, and epidemiological information.  Fact Sheet for Patients:   StrictlyIdeas.no  Fact Sheet for Healthcare Providers: BankingDealers.co.za  This test is not yet approved or  cleared  by the [place] and has been authorized for detection and/or diagnosis of SARS-CoV-2 by FDA under an Emergency Use Authorization (EUA).  This EUA will remain in effect (meaning this test can be used) for the duration of the COVID-19 declaration under Section 564(b)(1) of the Act, 21 U.S.C. section 360bbb-3(b)(1), unless the authorization is terminated or revoked sooner.  Performed at [facility], [place] [address], [place], [place] [address]   Blood culture (routine x 2)     Status: None   Collection  Time: 06/27/19  6:30 AM   Specimen: BLOOD  Result Value Ref Range Status   Specimen Description   Final    BLOOD BLOOD RIGHT FOREARM Performed at [facility] [address], [place], [place] [address]    Special Requests   Final    BOTTLES DRAWN AEROBIC ONLY Blood Culture results may not be optimal due to an inadequate volume of blood received in culture bottles Performed at [facility] [address], [place], [place] [address]    Culture   Final    NO GROWTH 5 DAYS Performed at [facility], [place] [address], [place], [place] [address]    Report Status 07/02/2019 FINAL  Final  Urine culture     Status: None   Collection Time: 06/27/19 12:42 PM   Specimen: Urine, Random  Result Value Ref Range Status   Specimen Description   Final    URINE, RANDOM Performed at [facility] [address], [place], [place] [address]    Special Requests   Final    NONE Performed at [facility], [place] [address], [place], [place] [address]    Culture   Final    NO GROWTH Performed at [facility], [place] [address] [place],  [address]    Report Status 06/28/2019 FINAL  Final         Radiology Studies: No results found.      Scheduled Meds: . atorvastatin  40 mg Oral Daily  . Chlorhexidine Gluconate Cloth  6 each Topical Daily  . feeding supplement (ENSURE ENLIVE)  237 mL Oral TID BM  . folic acid  1 mg Oral Daily  . heparin  5,000 Units Subcutaneous Q8H  . hydrocortisone cream   Topical BID  . insulin aspart  0-15 Units Subcutaneous TID WC  . insulin aspart  0-5 Units Subcutaneous QHS  . lactulose  20 g Oral TID  . LORazepam  2-4 mg Intravenous Once  . mirtazapine  30 mg Oral QHS  . multivitamin with minerals  1 tablet Oral Daily   Continuous Infusions: . sodium chloride 250 mL (07/03/19 1001)  . ceFEPime (MAXIPIME) IV 2 g (07/03/19 1002)  . vancomycin 1,000 mg (07/02/19 1229)      LOS: 6 days     Georgette Shell, MD 07/03/2019, 2:41 PM

## 2019-07-03 NOTE — Progress Notes (Signed)
Daily Progress Note   Patient Name: [name]       Date: 07/03/2019 DOB: [birth year]  Age: 64 y.o. MRN#: [id number] Attending Physician: Georgette Shell, MD Primary Care Physician: [place] Date: 06/27/2019  Reason for Consultation/Follow-up: Establishing goals of care  Subjective: Patient in bed, moaning. Awake and alert.  Met with patient's spouseKela [name], at bedside. Reviewed [name]'s course before and during hospitalization- we discussed her multiple co morbidites, and how the relate to one another- we discussed what [name]'s quality of life is and what her wishes would be. Prior to this admission- [name] feels that [name] was able to talk and interact with him. His goal would be to try and get [name] back to that mental state of awareness. Continued aggressive medical care- including amputation vs comfort measures and hospice was discussed. At close of discussion [name] noted that goals of care were to continue to aggressive medical care- he would like for [name] to undergo amputation and is ready to consent for surgery. He is hopeful that undergoing surgery will help clear her mental status. I discussed with him that the hope would be that the surgery would aid in this, but there is also concern that she could continue to decline despite surgery and mental status could worsen. He understands.   Review of Systems  Unable to perform ROS: Acuity of condition    Length of Stay: 6  Current Medications: Scheduled Meds:  . atorvastatin  40 mg Oral Daily  . Chlorhexidine Gluconate Cloth  6 each Topical Daily  . feeding supplement (ENSURE ENLIVE)  237 mL Oral TID BM  . folic acid  1 mg Oral Daily  . heparin  5,000 Units Subcutaneous Q8H  . hydrocortisone cream    Topical BID  . insulin aspart  0-15 Units Subcutaneous TID WC  . insulin aspart  0-5 Units Subcutaneous QHS  . lactulose  20 g Oral TID  . LORazepam  2-4 mg Intravenous Once  . mirtazapine  30 mg Oral QHS  . multivitamin with minerals  1 tablet Oral Daily    Continuous Infusions: . sodium chloride 250 mL (07/03/19 1001)  . ceFEPime (MAXIPIME) IV 2 g (07/03/19 1002)  . vancomycin 1,000 mg (07/02/19 1229)    PRN Meds: sodium chloride, acetaminophen **OR** acetaminophen, fentaNYL (SUBLIMAZE)  injection, traMADol  Physical Exam Vitals and nursing note reviewed.  Constitutional:      Appearance: She is ill-appearing.  Neurological:     Mental Status: She is alert.     Comments: Nonverbal, does not answer questions              Vital Signs: BP (!) 141/75   Pulse 89   Temp 98.1 F (36.7 C) (Oral)   Resp 17   Ht _0  (1.651 m)   Wt 110 kg   SpO2 98%   BMI 40.36 kg/m  SpO2: SpO2: 98 % O2 Device: O2 Device: Nasal Cannula O2 Flow Rate: O2 Flow Rate (L/min): 2 L/min  Intake/output summary:   Intake/Output Summary (Last 24 hours) at 07/03/2019 1041 Last data filed at 07/03/2019 1039 Gross per 24 hour  Intake 865.21 ml  Output 1926 ml  Net -1060.79 ml   LBM: Last BM Date: 07/01/19 Baseline Weight: Weight: 115 kg Most recent weight: Weight: 110 kg       Palliative Assessment/Data: PPS: 30%     Patient Active Problem List   Diagnosis Date Noted  . Advanced care planning/counseling discussion   . Chronic systolic CHF (congestive heart failure) ([place]) 06/27/2019  . Venous stasis ulcer ([place]) 06/27/2019  . Type 2 diabetes mellitus with stage 3b chronic kidney disease, with long-term current use of insulin ([place])   . DNR (do not resuscitate) discussion   . Acute metabolic encephalopathy   . Hypotension   . Acute kidney injury superimposed on CKD ([place])   . Goals of care, counseling/discussion   . Palliative care by specialist   . Anasarca 05/26/2019  . Cellulitis  05/26/2019  . Diabetic foot ulcer ([place]) 05/26/2019  . Decreased level of consciousness 05/26/2019  . Moderate mitral regurgitation by prior echocardiogram 05/04/2019  . Moderate - severe tricuspid regurgitation by prior echocardiogram 05/04/2019  . CKD stage 3 due to type 2 diabetes mellitus ([place]) 05/04/2019  . HCV antibody positive 05/04/2019  . Other cirrhosis of liver ([place]) 05/04/2019  . Fall 02/24/2019  . UTI (urinary tract infection) 02/23/2019  . AVM (arteriovenous malformation) of small bowel, acquired with hemorrhage   . Epistaxis 11/29/2018  . Altered mental status   . Heme positive stool   . Gastritis and gastroduodenitis   . Encephalopathy acute   . Acute on chronic blood loss anemia 11/20/2018  . Pressure injury of skin 10/26/2018  . Anxiety 08/15/2018  . Chronic back pain 08/15/2018  . GERD (gastroesophageal reflux disease) 08/15/2018  . History of carpal tunnel syndrome 08/15/2018  . HNP (herniated nucleus pulposus), lumbar 08/15/2018  . Hyperlipidemia 08/15/2018  . Hypertension 08/15/2018  . Insomnia 08/15/2018  . Left ventricular hypertrophy 08/15/2018  . Obesity 08/15/2018  . Sleep apnea 08/15/2018  . Anemia of chronic disease 06/20/2018  . Diabetic polyneuropathy associated with type 2 diabetes mellitus ([place]) 02/23/2018  . Moderate tricuspid regurgitation 02/18/2018  . Acute on chronic diastolic CHF (congestive heart failure) ([place]) 02/08/2018  . 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 Depression 02/05/2018  . Generalized abdominal pain 02/05/2018  . CHF (congestive heart failure) ([place]) 01/13/2018  . B12 deficiency 12/07/2017  . Iron deficiency anemia due to chronic blood loss 12/07/2017  . Folate deficiency 12/07/2017  . Upper GI bleed 11/03/2017  . COPD (chronic obstructive pulmonary disease) ([place]) 09/26/2017  . Symptomatic anemia 09/26/2017  . Anemia   . Weakness   . Lower GI bleeding 08/07/2017  . Chronic systolic heart failure ([place])  12/28/2016  .  Tobacco use 12/28/2016  . NSTEMI (non-ST elevated myocardial infarction) ([place])   . Sepsis ([place]) 12/18/2016  . CHF exacerbation ([place]) 12/04/2015  . PNA (pneumonia) 10/28/2015  . Chronic hepatitis C without hepatic coma ([place]) 05/21/2014  . CAD (coronary artery disease) 07/19/2013  . Right arm pain 06/26/2013  . Cervical pain (neck) 03/28/2013  . Radiculopathy affecting upper extremity 03/28/2013  . Impaired renal function 12/21/2012  . Abnormal mammogram 12/20/2012  . Diabetes mellitus type 2, uncontrolled ([place]) 12/20/2012    Palliative Care Assessment & Plan   Patient Profile:  64 y.o. female  with past medical history of chronic systolic heart failure, COPD, DM2, DOE, OSA, Hep C, other cirrhosis, CKD III, lower extremity cellulitis, lower extremity venous stasis ulcers,  admitted on 06/27/2019 with complaints of altered mental status. Workup reveals metabolic encephalopathy in the setting of infectious process related to lower extremity cellulitis, myositis, osteoitis. Vascular surgery consulted and recommend BKA. Spouse initially has not consented for surgery. Palliative consulted for [place].     Assessment/Recommendations/Plan   Metabolic encephalopathy likely related to infectious process- patient's spouse now consents for surgical intervention  Pain- encouraged RN to utilize PRN pain medications  PMT will continue to follow to see how patient fares after surgery  Goals of Care and Additional Recommendations:  Limitations on Scope of Treatment: Full Scope Treatment  Code Status:  Full code  Prognosis:   Unable to determine  Discharge Planning:  [facility] for rehab with Palliative care service follow-up  Care plan was discussed with patient's spouse- notified Dr. Rodena Piety  Thank you for allowing the Palliative Medicine Team to assist in the care of this patient.   Time In: 1010 Time Out: 1045 Total Time 35 minutes Prolonged Time Billed no        Greater than 50%  of this time was spent counseling and coordinating care related to the above assessment and plan.  Mariana Kaufman, AGNP-C Palliative Medicine   Please contact Palliative Medicine Team phone at [phone] for questions and concerns.

## 2019-07-04 ENCOUNTER — Inpatient Hospital Stay (HOSPITAL_COMMUNITY): Payer: Medicaid Other | Admitting: Certified Registered"

## 2019-07-04 ENCOUNTER — Encounter (HOSPITAL_COMMUNITY)
Admission: EM | Disposition: A | Discharge status: Skilled Nursing Facility | Payer: Self-pay | Source: Home / Self Care | Attending: Internal Medicine

## 2019-07-04 ENCOUNTER — Encounter (HOSPITAL_COMMUNITY): Payer: Self-pay | Admitting: Internal Medicine

## 2019-07-04 ENCOUNTER — Inpatient Hospital Stay (HOSPITAL_COMMUNITY): Payer: Medicaid Other

## 2019-07-04 DIAGNOSIS — J9601 Acute respiratory failure with hypoxia: Secondary | ICD-10-CM

## 2019-07-04 HISTORY — PX: AMPUTATION: SHX166

## 2019-07-04 LAB — COMPREHENSIVE METABOLIC PANEL
ALT: 8 U/L (ref 0–44)
AST: 14 U/L — ABNORMAL LOW (ref 15–41)
Albumin: 1.6 g/dL — ABNORMAL LOW (ref 3.5–5.0)
Alkaline Phosphatase: 109 U/L (ref 38–126)
Anion gap: 8 (ref 5–15)
BUN: 34 mg/dL — ABNORMAL HIGH (ref 8–23)
CO2: 21 mmol/L — ABNORMAL LOW (ref 22–32)
Calcium: 8.1 mg/dL — ABNORMAL LOW (ref 8.9–10.3)
Chloride: 117 mmol/L — ABNORMAL HIGH (ref 98–111)
Creatinine, Ser: 1.92 mg/dL — ABNORMAL HIGH (ref 0.44–1.00)
GFR calc Af Amer: 31 mL/min — ABNORMAL LOW (ref >60)
GFR calc non Af Amer: 27 mL/min — ABNORMAL LOW (ref >60)
Glucose, Bld: 124 mg/dL — ABNORMAL HIGH (ref 70–99)
Potassium: 4.7 mmol/L (ref 3.5–5.1)
Sodium: 146 mmol/L — ABNORMAL HIGH (ref 135–145)
Total Bilirubin: 0.8 mg/dL (ref 0.3–1.2)
Total Protein: 6.8 g/dL (ref 6.5–8.1)

## 2019-07-04 LAB — GLUCOSE, CAPILLARY
Glucose-Capillary: 93 mg/dL (ref 70–99)
Glucose-Capillary: 99 mg/dL (ref 70–99)
Glucose-Capillary: 111 mg/dL — ABNORMAL HIGH (ref 70–99)
Glucose-Capillary: 116 mg/dL — ABNORMAL HIGH (ref 70–99)
Glucose-Capillary: 114 mg/dL — ABNORMAL HIGH (ref 70–99)
Glucose-Capillary: 127 mg/dL — ABNORMAL HIGH (ref 70–99)

## 2019-07-04 LAB — CBC
HCT: 20.6 % — ABNORMAL LOW (ref 36.0–46.0)
Hemoglobin: 5.5 g/dL — CL (ref 12.0–15.0)
MCH: 22.5 pg — ABNORMAL LOW (ref 26.0–34.0)
MCHC: 26.7 g/dL — ABNORMAL LOW (ref 30.0–36.0)
MCV: 84.4 fL (ref 80.0–100.0)
Platelets: 133 10*3/uL — ABNORMAL LOW (ref 150–400)
RBC: 2.44 MIL/uL — ABNORMAL LOW (ref 3.87–5.11)
RDW: 26.7 % — ABNORMAL HIGH (ref 11.5–15.5)
WBC: 12.4 10*3/uL — ABNORMAL HIGH (ref 4.0–10.5)
nRBC: 0 % (ref 0.0–0.2)

## 2019-07-04 LAB — BLOOD GAS, ARTERIAL
Acid-base deficit: 3.8 mmol/L — ABNORMAL HIGH (ref 0.0–2.0)
Bicarbonate: 20.4 mmol/L (ref 20.0–28.0)
Drawn by: 331761
FIO2: 40
O2 Saturation: 98.3 %
Patient temperature: 36.7
pCO2 arterial: 34.5 mmHg (ref 32.0–48.0)
pH, Arterial: 7.388 (ref 7.350–7.450)
pO2, Arterial: 107 mmHg (ref 83.0–108.0)

## 2019-07-04 LAB — PREPARE RBC (CROSSMATCH)

## 2019-07-04 LAB — AMMONIA: Ammonia: 21 umol/L (ref 9–35)

## 2019-07-04 LAB — SURGICAL PCR SCREEN
MRSA, PCR: POSITIVE — AB
Staphylococcus aureus: POSITIVE — AB

## 2019-07-04 SURGERY — AMPUTATION, ABOVE KNEE
Anesthesia: General | Site: Knee | Laterality: Right

## 2019-07-04 MED ORDER — DEXMEDETOMIDINE HCL IN NACL 400 MCG/100ML IV SOLN
0.4000 ug/kg/h | INTRAVENOUS | Status: DC
  Administered 2019-07-04: 0.4 ug/kg/h via INTRAVENOUS
  Filled 2019-07-04: qty 100

## 2019-07-04 MED ORDER — OXYCODONE HCL 5 MG/5ML PO SOLN: 5.0000 mg | Freq: Once | ORAL | Status: DC | PRN

## 2019-07-04 MED ORDER — OXYCODONE HCL 5 MG PO TABS: 5.0000 mg | ORAL_TABLET | Freq: Once | ORAL | Status: DC | PRN

## 2019-07-04 MED ORDER — ROCURONIUM BROMIDE 10 MG/ML (PF) SYRINGE
PREFILLED_SYRINGE | INTRAVENOUS | Status: DC | PRN
  Administered 2019-07-04: 10 mg via INTRAVENOUS
  Administered 2019-07-04: 50 mg via INTRAVENOUS

## 2019-07-04 MED ORDER — PHENYLEPHRINE HCL-NACL 10-0.9 MG/250ML-% IV SOLN
INTRAVENOUS | Status: DC | PRN
  Administered 2019-07-04: 50 ug/min via INTRAVENOUS

## 2019-07-04 MED ORDER — ROCURONIUM BROMIDE 10 MG/ML (PF) SYRINGE
PREFILLED_SYRINGE | INTRAVENOUS | Status: AC
  Filled 2019-07-04: qty 10

## 2019-07-04 MED ORDER — ALBUTEROL SULFATE HFA 108 (90 BASE) MCG/ACT IN AERS
INHALATION_SPRAY | RESPIRATORY_TRACT | Status: AC
  Filled 2019-07-04: qty 6.7

## 2019-07-04 MED ORDER — EPHEDRINE 5 MG/ML INJ
INTRAVENOUS | Status: AC
  Filled 2019-07-04: qty 10

## 2019-07-04 MED ORDER — LIDOCAINE 2% (20 MG/ML) 5 ML SYRINGE
INTRAMUSCULAR | Status: AC
  Filled 2019-07-04: qty 5

## 2019-07-04 MED ORDER — PROPOFOL 10 MG/ML IV BOLUS
INTRAVENOUS | Status: DC | PRN
  Administered 2019-07-04: 160 mg via INTRAVENOUS

## 2019-07-04 MED ORDER — HYDROMORPHONE HCL 1 MG/ML IJ SOLN: 0.5000 mg | INTRAMUSCULAR | Status: DC | PRN

## 2019-07-04 MED ORDER — CHLORHEXIDINE GLUCONATE 0.12 % MT SOLN: 15.0000 mL | Freq: Once | OROMUCOSAL | Status: DC

## 2019-07-04 MED ORDER — DOCUSATE SODIUM 100 MG PO CAPS: 100.0000 mg | ORAL_CAPSULE | Freq: Every day | ORAL | Status: DC

## 2019-07-04 MED ORDER — SUGAMMADEX SODIUM 200 MG/2ML IV SOLN
INTRAVENOUS | Status: DC | PRN
  Administered 2019-07-04: 200 mg via INTRAVENOUS

## 2019-07-04 MED ORDER — FUROSEMIDE 10 MG/ML IJ SOLN
40.0000 mg | Freq: Three times a day (TID) | INTRAMUSCULAR | Status: DC
  Administered 2019-07-04 – 2019-07-06 (×7): 40 mg via INTRAVENOUS
  Filled 2019-07-04 (×7): qty 4

## 2019-07-04 MED ORDER — ORAL CARE MOUTH RINSE: 15.0000 mL | Freq: Once | OROMUCOSAL | Status: DC

## 2019-07-04 MED ORDER — DEXAMETHASONE SODIUM PHOSPHATE 10 MG/ML IJ SOLN
INTRAMUSCULAR | Status: DC | PRN
  Administered 2019-07-04: 4 mg via INTRAVENOUS

## 2019-07-04 MED ORDER — PHENOL 1.4 % MT LIQD
1.0000 | OROMUCOSAL | Status: DC | PRN
  Filled 2019-07-04: qty 177

## 2019-07-04 MED ORDER — OXYCODONE HCL 5 MG PO TABS: 5.0000 mg | ORAL_TABLET | ORAL | Status: DC | PRN

## 2019-07-04 MED ORDER — FENTANYL CITRATE (PF) 250 MCG/5ML IJ SOLN
INTRAMUSCULAR | Status: AC
  Filled 2019-07-04: qty 5

## 2019-07-04 MED ORDER — MIDAZOLAM HCL 2 MG/2ML IJ SOLN
INTRAMUSCULAR | Status: AC
  Filled 2019-07-04: qty 2

## 2019-07-04 MED ORDER — ONDANSETRON HCL 4 MG/2ML IJ SOLN: 4.0000 mg | Freq: Four times a day (QID) | INTRAMUSCULAR | Status: DC | PRN

## 2019-07-04 MED ORDER — DEXAMETHASONE SODIUM PHOSPHATE 10 MG/ML IJ SOLN
INTRAMUSCULAR | Status: AC
  Filled 2019-07-04: qty 1

## 2019-07-04 MED ORDER — FENTANYL CITRATE (PF) 100 MCG/2ML IJ SOLN
25.0000 ug | INTRAMUSCULAR | Status: DC | PRN
  Administered 2019-07-04 – 2019-07-07 (×6): 100 ug via INTRAVENOUS
  Filled 2019-07-04 (×6): qty 2

## 2019-07-04 MED ORDER — CHLORHEXIDINE GLUCONATE 0.12 % MT SOLN
OROMUCOSAL | Status: AC
  Filled 2019-07-04: qty 15

## 2019-07-04 MED ORDER — OXYCODONE-ACETAMINOPHEN 5-325 MG PO TABS: 1.0000 | ORAL_TABLET | ORAL | Status: DC | PRN

## 2019-07-04 MED ORDER — PROPOFOL 10 MG/ML IV BOLUS
INTRAVENOUS | Status: AC
  Filled 2019-07-04: qty 20

## 2019-07-04 MED ORDER — ALBUMIN HUMAN 5 % IV SOLN
INTRAVENOUS | Status: DC | PRN
Start: 2019-07-04 — End: 2019-07-04

## 2019-07-04 MED ORDER — FENTANYL CITRATE (PF) 100 MCG/2ML IJ SOLN
INTRAMUSCULAR | Status: DC | PRN
  Administered 2019-07-04 (×3): 50 ug via INTRAVENOUS

## 2019-07-04 MED ORDER — ONDANSETRON HCL 4 MG/2ML IJ SOLN
INTRAMUSCULAR | Status: DC | PRN
  Administered 2019-07-04: 4 mg via INTRAVENOUS

## 2019-07-04 MED ORDER — EPHEDRINE SULFATE-NACL 50-0.9 MG/10ML-% IV SOSY
PREFILLED_SYRINGE | INTRAVENOUS | Status: DC | PRN
  Administered 2019-07-04: 15 mg via INTRAVENOUS

## 2019-07-04 MED ORDER — LIDOCAINE 2% (20 MG/ML) 5 ML SYRINGE
INTRAMUSCULAR | Status: DC | PRN
  Administered 2019-07-04: 50 mg via INTRAVENOUS

## 2019-07-04 MED ORDER — ONDANSETRON HCL 4 MG/2ML IJ SOLN
INTRAMUSCULAR | Status: AC
  Filled 2019-07-04: qty 2

## 2019-07-04 MED ORDER — FENTANYL CITRATE (PF) 100 MCG/2ML IJ SOLN: 25.0000 ug | INTRAMUSCULAR | Status: DC | PRN

## 2019-07-04 MED ORDER — ALBUTEROL SULFATE HFA 108 (90 BASE) MCG/ACT IN AERS
INHALATION_SPRAY | RESPIRATORY_TRACT | Status: DC | PRN
  Administered 2019-07-04 (×2): 2 via RESPIRATORY_TRACT

## 2019-07-04 MED ORDER — PANTOPRAZOLE SODIUM 40 MG PO TBEC
40.0000 mg | DELAYED_RELEASE_TABLET | Freq: Every day | ORAL | Status: DC
  Filled 2019-07-04: qty 1

## 2019-07-04 MED ORDER — SODIUM CHLORIDE 0.9 % IV SOLN: INTRAVENOUS | Status: DC

## 2019-07-04 MED ORDER — 0.9 % SODIUM CHLORIDE (POUR BTL) OPTIME
TOPICAL | Status: DC | PRN
  Administered 2019-07-04 (×3): 1000 mL

## 2019-07-04 SURGICAL SUPPLY — 49 items
BANDAGE ESMARK 6X9 LF (GAUZE/BANDAGES/DRESSINGS) ×1 IMPLANT
BLADE SAW SAG 73X25 THK (BLADE) ×2
BLADE SAW SGTL 73X25 THK (BLADE) ×1 IMPLANT
BNDG COHESIVE 6X5 TAN NS LF (GAUZE/BANDAGES/DRESSINGS) ×3 IMPLANT
BNDG COHESIVE 6X5 TAN STRL LF (GAUZE/BANDAGES/DRESSINGS) ×3 IMPLANT
BNDG ELASTIC 4X5.8 VLCR STR LF (GAUZE/BANDAGES/DRESSINGS) ×3 IMPLANT
BNDG ELASTIC 6X10 VLCR STRL LF (GAUZE/BANDAGES/DRESSINGS) ×3 IMPLANT
BNDG ELASTIC 6X5.8 VLCR STR LF (GAUZE/BANDAGES/DRESSINGS) ×3 IMPLANT
BNDG ESMARK 6X9 LF (GAUZE/BANDAGES/DRESSINGS) ×3
BNDG GAUZE ELAST 4 BULKY (GAUZE/BANDAGES/DRESSINGS) ×6 IMPLANT
CANISTER SUCT 3000ML PPV (MISCELLANEOUS) ×3 IMPLANT
CLIP VESOCCLUDE MED 6/CT (CLIP) ×3 IMPLANT
COVER BACK TABLE 60X90IN (DRAPES) ×3 IMPLANT
COVER SURGICAL LIGHT HANDLE (MISCELLANEOUS) ×6 IMPLANT
COVER WAND RF STERILE (DRAPES) ×3 IMPLANT
DRAIN CHANNEL 19F RND (DRAIN) IMPLANT
DRAPE HALF SHEET 40X57 (DRAPES) ×3 IMPLANT
DRAPE ORTHO SPLIT 77X108 STRL (DRAPES) ×4
DRAPE SURG ORHT 6 SPLT 77X108 (DRAPES) ×2 IMPLANT
DRAPE U-SHAPE 47X51 STRL (DRAPES) IMPLANT
DRSG ADAPTIC 3X8 NADH LF (GAUZE/BANDAGES/DRESSINGS) ×3 IMPLANT
ELECT CAUTERY BLADE 6.4 (BLADE) ×3 IMPLANT
ELECT REM PT RETURN 9FT ADLT (ELECTROSURGICAL) ×3
ELECTRODE REM PT RTRN 9FT ADLT (ELECTROSURGICAL) ×1 IMPLANT
EVACUATOR SILICONE 100CC (DRAIN) IMPLANT
GAUZE SPONGE 4X4 12PLY STRL (GAUZE/BANDAGES/DRESSINGS) ×6 IMPLANT
GLOVE BIO SURGEON STRL SZ7.5 (GLOVE) ×9 IMPLANT
GLOVE BIOGEL PI IND STRL 8 (GLOVE) ×1 IMPLANT
GLOVE BIOGEL PI INDICATOR 8 (GLOVE) ×2
GOWN STRL REUS W/ TWL XL LVL3 (GOWN DISPOSABLE) ×1 IMPLANT
GOWN STRL REUS W/TWL XL LVL3 (GOWN DISPOSABLE) ×2
KIT BASIN OR (CUSTOM PROCEDURE TRAY) ×3 IMPLANT
KIT TURNOVER KIT B (KITS) ×3 IMPLANT
NS IRRIG 1000ML POUR BTL (IV SOLUTION) ×3 IMPLANT
PACK GENERAL/GYN (CUSTOM PROCEDURE TRAY) ×3 IMPLANT
PAD ABD 8X10 STRL (GAUZE/BANDAGES/DRESSINGS) ×6 IMPLANT
PAD ARMBOARD 7.5X6 YLW CONV (MISCELLANEOUS) ×6 IMPLANT
STAPLER VISISTAT 35W (STAPLE) ×3 IMPLANT
STOCKINETTE IMPERVIOUS LG (DRAPES) ×3 IMPLANT
SUT ETHILON 3 0 PS 1 (SUTURE) IMPLANT
SUT SILK 0 TIES 10X30 (SUTURE) ×3 IMPLANT
SUT SILK 2 0 (SUTURE) ×2
SUT SILK 2 0 SH CR/8 (SUTURE) ×3 IMPLANT
SUT SILK 2-0 18XBRD TIE 12 (SUTURE) ×1 IMPLANT
SUT VIC AB 2-0 CT1 18 (SUTURE) ×6 IMPLANT
SUT VIC AB 3-0 SH 18 (SUTURE) IMPLANT
TOWEL GREEN STERILE (TOWEL DISPOSABLE) ×6 IMPLANT
UNDERPAD 30X36 HEAVY ABSORB (UNDERPADS AND DIAPERS) ×3 IMPLANT
WATER STERILE IRR 1000ML POUR (IV SOLUTION) ×3 IMPLANT

## 2019-07-04 NOTE — Consult Note (Addendum)
NAME:  [name], MRN:  [id number], DOB:  [birth year], LOS: 7 ADMISSION DATE:  06/27/2019, CONSULTATION DATE:  07/04/19 REFERRING MD:  Dr. Carlis Abbott, CHIEF COMPLAINT:  Post-op respiratory failure  Brief History   [name] is a 64yo f with a PMHx of diastolic CHF with anasarca, NSTEMI, COPD, DMII uncontrolled, anemia, CKD IIIB, hepatitis C with cirrhosis who presented for altered mental status. 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 She was unable to be extubated following surgery. CCM was consulted for ventilator management.  History of present illness   [name] is a 64yo f with a PMHx of diastolic CHF with anasarca, NSTEMI, COPD, DMII uncontrolled, anemia, CKD IIIB, hepatitis C with cirrhosis who presented to [facility] ED from [facility] for altered mental status. Per chart review, patient's nurse at the nursing facility had noted that she had been moaning and groaning without coherent conversation, though at baseline she is oriented and able to converse normally. Pt was also noted to be hypoxic to 58% while at nursing facility. She does not normally wear O2 at home. While at [facility], pt also developed desaturations and was placed and maintained on BiPAP with improvement in hypoxia. She developed worsening lethargy and unresponsiveness on 6/14 that required her to be placed on BiPAP again. Pt also underwent MRI that showed R foot abscess/myositis/tenosynovitis as well as L foot myositis and leg cellulitis. Not thought to be osteomyelitis. Vascular surgery was consulted and recommended right above knee amputation and left angiography. Patient's husband had refused amputation and consented to have R AKA on 6/17. Patient was transferred to [facility] for the procedure. She underwent AKA on 8/78 without complication. Extubation was attempted in the OR including with pressure support  but patient was unable to generate adequate volumes. She also was unable to be weaned while at PACU.  Notably, patient was admitted at [facility] between 5/9-5/20/21 for Group c strep bacteremia and bilateral leg edema, as well as R leg superficial wounds and cellulitis. Per discharge summary, patient's mental status was impaired and thought to be due to sepsis. During this hospitalization, she had a TEE (05/30/19) and prosthetic valve endocarditis was ruled out. Patient had also completed a course of Augmentin for bilateral lower extremity ulcers/cellulitis on June 8th.    Past Medical History  Diastolic CHF Chronic anemia COPD Diabetes mellitus II  Gastric ulcer NSTEMI CKD IIIB Hepatitis C with cirrhosis Chronic leg venous ulcers  Significant Hospital Events   6/10 > Admitted to [facility] 6/12 > Transferred to [facility] from [place] 6/13 > rapid response called at [facility] due to worsening lethargy. Placed on BiPAP   Consults:  Palliative care   Procedures:  6/17 > R leg above knee amputation  Significant Diagnostic Tests:   CXR 6/10 > increasing CHF with R basilar atelectasis.   MRI L tibia/fibula 6/10 > nonspecific subcutaneous edema throughout RLL. No evidence of osteomyelitis. Mild diffuse intramuscular edema, myositis vs. Denervation changes  MRI R foot 6/10 > no evidence of acute osteomyelitis. Ill-defined multilobulated fluid collections at dorsal proximal foot and along dorsal and medial foot concerning for developing abscesses. Diffuse edema suggesting myositis. Mild extensor digitorum longus tenosynovitis  MRI L foot 6/10 > significantly limited exam, pt unable to complete full exam. Interval amputation of L great toe at first MTP joint. Findings c/w reactive osteitis. Early acute osteomyelitis thought to  be unlikely. Nonspecific myositis.  MRI L tibia/fibula 6/11 > negative for osteomyelitis or myositis. Diffuse subcutaneous edema, possible cellulitis. No  subcutaneous abscess though limited exam.  MRI Head 6/13 > no acute intracranial abnormality, chronic ischemic microangiopathy   Micro Data:  Ucx 6/10 > neg Blood cx 6/10 > neg COVID 6/10 > neg MRSA PCR 6/17 > positive  Antimicrobials:  Cefepime 6/10  > Vancomycin 6/10 >  Interim history/subjective:  As above, see HPI. Patient not currently sedated.  Objective   Blood pressure (!) 109/54, pulse 79, temperature 98 F (36.7 C), resp. rate 16, height 5' 5"  (1.651 m), weight 108.6 kg, SpO2 100 %.    Vent Mode: PRVC FiO2 (%):  [0.4 %-40 %] 40 % Set Rate:  [16 bmp] 16 bmp Vt Set:  [450 mL] 450 mL PEEP:  [5 cmH20] 5 cmH20 Plateau Pressure:  [22 cmH20] 22 cmH20   Intake/Output Summary (Last 24 hours) at 07/04/2019 1431 Last data filed at 07/04/2019 1153 Gross per 24 hour  Intake 689.53 ml  Output 550 ml  Net 139.53 ml   Filed Weights   07/02/19 0111 07/03/19 0027 07/04/19 0000  Weight: 108.4 kg 110 kg 108.6 kg    Examination: General: elderly female, ill-appearing, lying comfortably in PACU bed, intubated  HENT: normocephalic, atraumatic Lungs: CTAB, normal respiratory effort, no wheezes or crackles Cardiovascular: normal S1, S2, RRR, no JVD, no m/r/g Abdomen: nondistended, soft, nontender, rounded, active BS Extremities: chronic venous stasis of LLE, amputated L big toe, RLE bandaged at site of AKA and bleeding through dressing   Neuro: awakens and opens eyes, not oriented, not responsive to commands, moving head side to side spontaneously and blinking  Resolved Hospital Problem list     Assessment & Plan:   ASSESSMENT / PLAN:  PULMONARY:  A:  Acute Post-operative Respiratory Failure 2/2 anesthetic retention and underlying medical problems Most recent ABG post-op showing 7.38 with CO2 34.5. P:  - On ventilator. Titrate vent settings to maintain O2 sats >92% - Wean from ventilator as appropriate  NEUROLOGIC A: Acute on Chronic Encephalopathy, of multifactorial  etiology   Pt has had worsening mental status throughout her hospital course, per chart review. Thought to be related to her multiple infections including MRSA, hypoxia, and hypercapnia. May also be related to metabolic abnormalities, and possible component of hepatic encephalopathy in the setting if cirrhosis. Brain MRI on 6/13 negative for acute findings. Pt remains encephalopathic in post-op setting, likely with a component of delayed anesthetic clearance due to impaired renal function. P:   - Precedex, propofol - Fentanyl - mIVF with LR   - f/u ammonia level  VASCULAR  A:   Chronic venous stasis ulcers and R soft tissue abscess, L leg cellulitis Underwent R AKA on 6/17 due to full-thickness tissue loss. P:  - Vancomycin, cefepime - Management per Vascular surgery  CARDIAC   A: Acute systolic heart failure superimposed on chronic diastolic CHF  Echo done during current admission showed septal dyskinesis c/w RBBB. LVEF 40-45%. Grade 2 diastolic dysfunction. CXR showed signs of fluid overload. Pt has undergone several doses of Lasix.  P: - Monitor I/Os - Daily weight  - Scheduled Lasix  INFECTIOUS A: Chronic bilateral lower extremity cellulitis MRSA PCR+ on pre-op evaluation Normal WBC on prior lab draws and afebrile. Blood cx negative to date. P:   - Continue vancomycin, cefepime - Trend fever curve, CBC  RENAL A: CKD IIIb Last Cr at 2.00. Per chart review, patient's baseline Cr  is 1.95. Not on dialysis. P:  - Trend BMET - IVF as above  ELECTROLYTES A:  Hyperkalemia improved (6.1 on admission, most recently 4.2 on 6/16). 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 Hb stable in mid-8s.  PT/INR 17.4/1.5 on the 14th.  Will continue to monitor for signs of bleeding. P:  - F/u CBC tomorrow - PRBC for hgb </= 6.9gm%    - exceptions are   -  if ACS susepcted/confirmed then transfuse for hgb </= 8.0gm%,  or    -  active bleeding with hemodynamic instability, then transfuse regardless of hemoglobin value   At at all times try to transfuse 1 unit prbc as possible with exception of active hemorrhage  ENDOCRINE A:   Poorly controlled DM II A1c 7.4%. Glucose moderately controlled on SSI. Last reading 116 on 6/16.  P:   - SSI  Best practice:  Diet: NPO Pain/Anxiety/Delirium protocol (if indicated): na VAP protocol (if indicated): na DVT prophylaxis: SQ heparin Glucose control: SSI Mobility: bedridden Code Status: full Family Communication: per primary team Disposition:   Labs   CBC: Recent Labs  Lab 06/28/19 0446 06/30/19 0721 07/01/19 0736 07/02/19 0827 07/03/19 0448  WBC 8.4 7.2 6.6 9.0 8.0  NEUTROABS 6.2  --   --   --   --   HGB 8.8* 8.6* 8.6* 9.1* 8.3*  HCT 32.9* 32.5* 32.8* 34.6* 31.4*  MCV 83.7 84.6 85.0 84.4 85.6  PLT 155 159 153 140* 136*    Basic Metabolic Panel: Recent Labs  Lab 06/28/19 0617 06/30/19 0721 07/01/19 0736 07/02/19 0827 07/03/19 0448  NA 141 143 144 145 143  K 5.2* 4.3 4.4 4.5 4.2  CL 108 114* 115* 116* 112*  CO2 22 22 20* 22 21*  GLUCOSE 90 126* 90 119* 116*  BUN 45* 40* 39* 37* 36*  CREATININE 2.41* 2.33* 2.09* 2.00* 2.00*  CALCIUM 8.1* 8.1* 8.2* 8.5* 8.4*  MG 1.8 1.9  --   --   --   PHOS 4.6  --   --   --   --    GFR: Estimated Creatinine Clearance: 34.8 mL/min (A) (by C-G formula based on SCr of 2 mg/dL (H)). Recent Labs  Lab 06/30/19 0721 07/01/19 0736 07/02/19 0827 07/03/19 0448  WBC 7.2 6.6 9.0 8.0    Liver Function Tests: Recent Labs  Lab 06/28/19 0617 06/30/19 0721  AST 24 19  ALT 14 12  ALKPHOS 167* 168*  BILITOT 0.8 0.9  PROT 8.0 8.4*  ALBUMIN 1.9* 1.8*   No results for input(s): LIPASE,  AMYLASE in the last 168 hours. Recent Labs  Lab 06/29/19 1425  AMMONIA 40*    ABG    Component Value Date/Time   PHART 7.388 07/04/2019 1400   PCO2ART 34.5 07/04/2019 1400   PO2ART 107 07/04/2019 1400   HCO3 20.4 07/04/2019 1400   TCO2 28 09/29/2006 1033   ACIDBASEDEF 3.8 (H) 07/04/2019 1400   O2SAT 98.3 07/04/2019 1400     Coagulation Profile: Recent Labs  Lab 07/01/19 0736  INR 1.5*    Cardiac Enzymes: No results for input(s): CKTOTAL, CKMB, CKMBINDEX, TROPONINI in the last 168 hours.  HbA1C: Hgb A1c MFr Bld  Date/Time Value Ref  Range Status  05/27/2019 05:30 AM 7.4 (H) 4.8 - 5.6 % Final    Comment:    (NOTE) Pre diabetes:          5.7%-6.4% Diabetes:              >6.4% Glycemic control for   <7.0% adults with diabetes   05/08/2019 05:08 AM 6.8 (H) 4.8 - 5.6 % Final    Comment:    (NOTE) Pre diabetes:          5.7%-6.4% Diabetes:              >6.4% Glycemic control for   <7.0% adults with diabetes     CBG: Recent Labs  Lab 07/03/19 1710 07/03/19 2326 07/04/19 0619 07/04/19 0914 07/04/19 1231  GLUCAP 103* 113* 93 99 111*    Review of Systems:   Limited by patient's altered mental status  Past Medical History  She,  has a past medical history of Cervical cancer ([place]), CHF (congestive heart failure) ([place]), Chronic anemia, COPD (chronic obstructive pulmonary disease) ([place]), Diabetes mellitus without complication ([place]), Gastric ulcer, NSTEMI (non-ST elevated myocardial infarction) ([place]), and Upper GI bleed.   Surgical History    Past Surgical History:  Procedure Laterality Date  . AMPUTATION Left 06/24/2018   Procedure: AMPUTATION GREAT TOE;  Surgeon: Samara Deist, DPM;  Location: [facility];  Service: Podiatry;  Laterality: Left;  . APPENDECTOMY    . BIOPSY  11/22/2018   Procedure: BIOPSY;  Surgeon: Thornton Park, MD;  Location: [facility];  Service: Gastroenterology;;  . CARDIAC CATHETERIZATION    . CHOLECYSTECTOMY    . COLONOSCOPY N/A  09/28/2017   Procedure: COLONOSCOPY;  Surgeon: Toledo, Benay Pike, MD;  Location: [facility] ENDOSCOPY;  Service: Gastroenterology;  Laterality: N/A;  . COLONOSCOPY WITH PROPOFOL N/A 08/21/2018   Procedure: COLONOSCOPY WITH PROPOFOL;  Surgeon: Lin Landsman, MD;  Location: [facility] ENDOSCOPY;  Service: Gastroenterology;  Laterality: N/A;  . COLONOSCOPY WITH PROPOFOL N/A 12/28/2018   Procedure: COLONOSCOPY WITH PROPOFOL;  Surgeon: Rush Landmark Telford Nab., MD;  Location: [facility]OSCOPY;  Service: Gastroenterology;  Laterality: N/A;  . ENTEROSCOPY Left 11/06/2017   Procedure: ENTEROSCOPY;  Surgeon: Jonathon Bellows, MD;  Location: [facility] ENDOSCOPY;  Service: Gastroenterology;  Laterality: Left;  . ENTEROSCOPY N/A 06/22/2018   Procedure: ENTEROSCOPY;  Surgeon: Lin Landsman, MD;  Location: [facility]OSCOPY;  Service: Gastroenterology;  Laterality: N/A;  . ENTEROSCOPY N/A 08/18/2018   Procedure: ENTEROSCOPY;  Surgeon: Jonathon Bellows, MD;  Location: [facility] ENDOSCOPY;  Service: Gastroenterology;  Laterality: N/A;  . ENTEROSCOPY N/A 11/01/2018   Procedure: ENTEROSCOPY;  Surgeon: Virgel Manifold, MD;  Location: [facility] ENDOSCOPY;  Service: Endoscopy;  Laterality: N/A;  . ENTEROSCOPY N/A 11/22/2018   Procedure: ENTEROSCOPY;  Surgeon: Thornton Park, MD;  Location: [place];  Service: Gastroenterology;  Laterality: N/A;  . ENTEROSCOPY Left 12/17/2018   Procedure: ENTEROSCOPY;  Surgeon: Yetta Flock, MD;  Location: [facility]OSCOPY;  Service: Gastroenterology;  Laterality: Left;  . ENTEROSCOPY N/A 12/19/2018   Procedure: ENTEROSCOPY;  Surgeon: Yetta Flock, MD;  Location: [facility];  Service: Gastroenterology;  Laterality: N/A;  . ENTEROSCOPY N/A 12/20/2018   Procedure: ENTEROSCOPY;  Surgeon: Lavena Bullion, DO;  Location: [facility];  Service: Gastroenterology;  Laterality: N/A;  . ESOPHAGOGASTRODUODENOSCOPY N/A 12/19/2016   Procedure: ESOPHAGOGASTRODUODENOSCOPY (EGD);  Surgeon: Lin Landsman,  MD;  Location: [facility] ENDOSCOPY;  Service: Gastroenterology;  Laterality: N/A;  . ESOPHAGOGASTRODUODENOSCOPY N/A 09/28/2017   Procedure: ESOPHAGOGASTRODUODENOSCOPY (EGD);  Surgeon: Alice Reichert, Benay Pike, MD;  Location: [facility] ENDOSCOPY;  Service: Gastroenterology;  Laterality: N/A;  . ESOPHAGOGASTRODUODENOSCOPY (EGD) WITH PROPOFOL N/A 08/08/2017   Procedure: ESOPHAGOGASTRODUODENOSCOPY (EGD) WITH PROPOFOL;  Surgeon: [name], MD;  Location: [facility] ENDOSCOPY;  Service: Endoscopy;  Laterality: N/A;  . GIVENS CAPSULE STUDY N/A 10/28/2018   Procedure: GIVENS CAPSULE STUDY;  Surgeon: Jonathon Bellows, MD;  Location: [facility] ENDOSCOPY;  Service: Gastroenterology;  Laterality: N/A;  . GIVENS CAPSULE STUDY N/A 12/26/2018   Procedure: GIVENS CAPSULE STUDY;  Surgeon: Rush Landmark Telford Nab., MD;  Location: [facility];  Service: Gastroenterology;  Laterality: N/A;  . [place]    . HEMOSTASIS CLIP PLACEMENT  12/19/2018   Procedure: HEMOSTASIS CLIP PLACEMENT;  Surgeon: Yetta Flock, MD;  Location: [facility];  Service: Gastroenterology;;  . HEMOSTASIS CLIP PLACEMENT  12/28/2018   Procedure: HEMOSTASIS CLIP PLACEMENT;  Surgeon: Irving Copas., MD;  Location: [facility];  Service: Gastroenterology;;  . HOT HEMOSTASIS N/A 12/17/2018   Procedure: HOT HEMOSTASIS (ARGON PLASMA COAGULATION/BICAP);  Surgeon: Yetta Flock, MD;  Location: [facility];  Service: Gastroenterology;  Laterality: N/A;  . HOT HEMOSTASIS N/A 12/28/2018   Procedure: HOT HEMOSTASIS (ARGON PLASMA COAGULATION/BICAP);  Surgeon: Irving Copas., MD;  Location: [facility];  Service: Gastroenterology;  Laterality: N/A;  . LEFT HEART CATH AND CORONARY ANGIOGRAPHY N/A 12/23/2016   Procedure: LEFT HEART CATH AND CORONARY ANGIOGRAPHY;  Surgeon: Teodoro Spray, MD;  Location: [place] CV LAB;  Service: Cardiovascular;  Laterality: N/A;  . LOWER EXTREMITY ANGIOGRAPHY Left 06/26/2018   Procedure: Lower  Extremity Angiography;  Surgeon: Katha Cabal, MD;  Location: [place] CV LAB;  Service: Cardiovascular;  Laterality: Left;  . SUBMUCOSAL INJECTION  12/19/2018   Procedure: SUBMUCOSAL INJECTION;  Surgeon: Yetta Flock, MD;  Location: [facility];  Service: Gastroenterology;;  . Lia Foyer TATTOO INJECTION  12/17/2018   Procedure: SUBMUCOSAL TATTOO INJECTION;  Surgeon: Yetta Flock, MD;  Location: [facility];  Service: Gastroenterology;;  . Lia Foyer TATTOO INJECTION  12/28/2018   Procedure: SUBMUCOSAL TATTOO INJECTION;  Surgeon: Irving Copas., MD;  Location: [facility];  Service: Gastroenterology;;  . TEE WITHOUT CARDIOVERSION N/A 10/29/2018   Procedure: TRANSESOPHAGEAL ECHOCARDIOGRAM (TEE);  Surgeon: Teodoro Spray, MD;  Location: [facility];  Service: Cardiovascular;  Laterality: N/A;  . TEE WITHOUT CARDIOVERSION N/A 05/30/2019   Procedure: TRANSESOPHAGEAL ECHOCARDIOGRAM (TEE);  Surgeon: Teodoro Spray, MD;  Location: [facility];  Service: Cardiovascular;  Laterality: N/A;     Social History   reports that she has been smoking cigarettes. She has been smoking about 0.25 packs per day. She has never used smokeless tobacco. She reports that she does not drink alcohol and does not use drugs.   Family History   Her family history includes Breast cancer in her mother; Cancer in her sister; Diabetes Mellitus II in her mother; Heart disease in her father; Hypertension in her mother.   Allergies Allergies  Allergen Reactions  . Latex Anaphylaxis and Rash  . Gabapentin Nausea And Vomiting and Other (See Comments)     Home Medications  Prior to Admission medications   Medication Sig Start Date End Date Taking? Authorizing Provider  Amino Acids-Protein Hydrolys (FEEDING SUPPLEMENT, PRO-STAT SUGAR FREE 64,) LIQD Take 30 mLs by mouth 2 (two) times daily. Patient taking differently: Take 30 mLs by mouth 2 (two) times daily with a meal.  12/07/18  Yes Al Decant, MD  atorvastatin (LIPITOR) 40 MG tablet Take 40 mg by mouth daily.   Yes  [provider]  dronabinol (MARINOL) 2.5 MG capsule Take 1 capsule (2.5 mg total) by mouth 2 (two) times daily before lunch and supper. 06/05/19  Yes Lavina Hamman, MD  ferrous sulfate 325 (65 FE) MG tablet Take 1 tablet (325 mg total) by mouth 2 (two) times daily with a meal. 05/10/19  Yes Fritzi Mandes, MD  fluticasone (FLONASE) 50 MCG/ACT nasal spray Place 2 sprays into both nostrils daily.   Yes [provider]  folic acid (FOLVITE) 1 MG tablet Take 1 tablet (1 mg total) by mouth daily. 08/21/18  Yes Ojie, Jude, MD  furosemide (LASIX) 20 MG tablet Take 1 tablet (20 mg total) by mouth daily. 06/06/19  Yes Lavina Hamman, MD  hydrocerin (EUCERIN) CREA Apply 1 application topically 2 (two) times daily. 06/05/19  Yes Lavina Hamman, MD  HYDROcodone-acetaminophen (NORCO/VICODIN) 5-325 MG tablet Take 1 tablet by mouth every 6 (six) hours as needed for moderate pain.   Yes [provider]  lactulose (CHRONULAC) 10 GM/15ML solution Take 30 mLs (20 g total) by mouth 3 (three) times daily. 12/07/18  Yes Al Decant, MD  mirtazapine (REMERON) 30 MG tablet Take 30 mg by mouth at bedtime. 03/29/19  Yes [provider]  Multiple Vitamins-Minerals (CERTAVITE SENIOR PO) Take 1 tablet by mouth daily.   Yes [provider]  Nutritional Supplements (RESOURCE 2.0 PO) Take 1 Bottle by mouth 2 (two) times daily.   Yes [provider]  pregabalin (LYRICA) 75 MG capsule Take 1 capsule (75 mg total) by mouth 2 (two) times daily. 06/05/19  Yes Lavina Hamman, MD  collagenase (SANTYL) ointment Apply topically daily. Patient not taking: Reported on 06/27/2019 05/10/19   Fritzi Mandes, MD  Multiple Vitamin (MULTIVITAMIN WITH MINERALS) TABS tablet Take 1 tablet by mouth daily. Patient not taking: Reported on 06/27/2019 06/28/18   Lang Snow, NP     Critical care time:     Acquanetta Sit, MS4

## 2019-07-04 NOTE — Progress Notes (Signed)
    Call from the bedside nurse.  I am at a remote location.  And another ICU.  Nurse reports patient needs a OG tube.  She also reports that left pupil is dilated compared to the right pupil and less reactive  Plan -Insert OG tube -Get CT head    SIGNATURE    Dr. Brand Males, M.D., F.C.C.P,  Pulmonary and Critical Care Medicine Staff Physician, [place] Director - Interstitial Lung Disease  Program  Pulmonary [place] at [place], [place], [address]  Pager: [phone], If no answer or between  15:00h - 7:00h: call [phone] Telephone: [phone]  6:14 PM 07/04/2019

## 2019-07-04 NOTE — Progress Notes (Signed)
OR said they will sign the consent and report given to RN in short stay.

## 2019-07-04 NOTE — Progress Notes (Signed)
Dr Apolonio Schneiders at bedside to take down drsg after being notified of an approx 250-374m blood loss from saturated drsg / additional staples placed along op site/ thinks may be skin edge losses / surgicell placed and 4x4's/abds/kerlix then covered with compresion wraps, then coban wrap/

## 2019-07-04 NOTE — Progress Notes (Signed)
RT NOTE:  Pt transported to CT and back to 2M08 without event.

## 2019-07-04 NOTE — Anesthesia Procedure Notes (Signed)
Procedure Name: Intubation Date/Time: 07/04/2019 10:57 AM Performed by: Barrington Ellison, CRNA Pre-anesthesia Checklist: Patient identified, Emergency Drugs available, Suction available and Patient being monitored Patient Re-evaluated:Patient Re-evaluated prior to induction Oxygen Delivery Method: Circle System Utilized Preoxygenation: Pre-oxygenation with 100% oxygen Induction Type: IV induction Ventilation: Mask ventilation without difficulty Laryngoscope Size: Mac and 3 Grade View: Grade I Tube type: Oral Tube size: 7.0 mm Number of attempts: 2 Airway Equipment and Method: Stylet and Oral airway Placement Confirmation: ETT inserted through vocal cords under direct vision,  positive ETCO2 and breath sounds checked- equal and bilateral Tube secured with: Tape Dental Injury: Teeth and Oropharynx as per pre-operative assessment  Comments: First attempr at DL, patient bronchospasmed. Grade 1 view

## 2019-07-04 NOTE — Progress Notes (Signed)
Plan right above knee amputation.  I discussed with her husband Kela Millin who wants to proceed and is consenting to surgery.  [name], MD [facility] Office: [place]

## 2019-07-04 NOTE — Plan of Care (Signed)
  Problem: Safety: Goal: Ability to remain free from injury will improve Outcome: Progressing   

## 2019-07-04 NOTE — Anesthesia Preprocedure Evaluation (Signed)
Anesthesia Evaluation  Patient identified by MRN, date of birth, ID band Patient confused    Reviewed: Allergy & Precautions, H&P , NPO status , Patient's Chart, lab work & pertinent test results  Airway Mallampati: II   Neck ROM: full    Dental   Pulmonary sleep apnea , COPD, Current Smoker,    breath sounds clear to auscultation       Cardiovascular hypertension, + CAD, + Past MI and +CHF   Rhythm:regular Rate:Normal     Neuro/Psych PSYCHIATRIC DISORDERS Anxiety Depression  Neuromuscular disease    GI/Hepatic PUD, GERD  ,  Endo/Other  diabetes, Type 2  Renal/GU Renal InsufficiencyRenal disease     Musculoskeletal   Abdominal   Peds  Hematology  (+) Blood dyscrasia, anemia ,   Anesthesia Other Findings   Reproductive/Obstetrics                             Anesthesia Physical Anesthesia Plan  ASA: III  Anesthesia Plan: General   Post-op Pain Management:    Induction: Intravenous  PONV Risk Score and Plan: 2 and Ondansetron, Dexamethasone and Treatment may vary due to age or medical condition  Airway Management Planned: Oral ETT  Additional Equipment:   Intra-op Plan:   Post-operative Plan: Extubation in OR  Informed Consent: I have reviewed the patients History and Physical, chart, labs and discussed the procedure including the risks, benefits and alternatives for the proposed anesthesia with the patient or authorized representative who has indicated his/her understanding and acceptance.       Plan Discussed with: CRNA, Anesthesiologist and Surgeon  Anesthesia Plan Comments:         Anesthesia Quick Evaluation

## 2019-07-04 NOTE — Op Note (Signed)
    OPERATIVE NOTE   PROCEDURE: right above-the-knee amputation  PRE-OPERATIVE DIAGNOSIS: right foot full thickness tissue loss  POST-OPERATIVE DIAGNOSIS: same as above  SURGEON: [name], MD  ASSISTANT(S): OR staff  ANESTHESIA: general  ESTIMATED BLOOD LOSS: 350 mL  FINDING(S): Right above-the-knee amputation was performed with healthy tissue margins.  SPECIMEN(S):  right above-the-knee amputation  INDICATIONS:   [name] is a 64 y.o. female who presents with right foot full thickness tissue loss that is extensive.  We have recommended a right above knee amputation.  The patient resides in a SNF and appears she is non-ambulatory at this time.  The patient is scheduled for a right above-the-knee amputation.  I discussed in depth with the patient the risks, benefits, and alternatives to this procedure.  The patient is aware that the risk of this operation included but are not limited to:  bleeding, infection, myocardial infarction, stroke, death, failure to heal amputation wound, and possible need for more proximal amputation.  The patient is aware of the risks and agrees proceed forward with the procedure.  DESCRIPTION: After full informed written consent was obtained from the patient, the patient was brought back to the operating room, and placed supine upon the operating table.  Prior to induction, the patient received IV antibiotics.  After obtaining adequate anesthesia, the patient was prepped and draped in the standard fashion for a above-the-knee amputation.  I marked out the anterior and posterior flaps for a fish-mouth type of amputation. I made the incisions for these flaps, and then dissected through the subcutaneous tissue, fascia, and muscles circumferentially.  I elevated  the periosteal tissue 4 cm more proximal than the anterior skin flap.  I then transected the femur with a power saw at this level.  At this point I then completed the posterior flap and  ligated the femoral vessels between large kelly clamps.  At this point, the specimen was passed off the field as the above-the-knee amputation.  At this point, I clamped all visibly bleeding arteries and veins using a combination of suture ligation with silk suture and electrocautery.  The femoral vessels were suture ligated with 2-0 silk and 2-0 Vicryl.  Bleeding continued to be controlled with electrocautery and suture ligature.  The stump was washed off with sterile normal saline and no further active bleeding was noted.  I reapproximated the anterior and posterior fascia  with interrupted stitches of 2-0 Vicryl.  This was completed along the entire length of anterior and posterior fascia until there were no more loose space in the fascial line.  The skin was then  reapproximated with staples.  The stump was washed off and dried.  The incision was dressed with [employer] and  then fluffs were applied.  Kerlix was wrapped around the leg and then gently an ACE wrap was applied.    COMPLICATIONS: None  CONDITION: Stable  [name], MD [facility]: [phone]  07/04/2019, 11:57 AM

## 2019-07-04 NOTE — Progress Notes (Signed)
Lab called surgical PCR positive for MRSA OR notified will to monitor the patient.

## 2019-07-04 NOTE — Progress Notes (Signed)
  Progress Note    07/04/2019 4:53 PM Day of Surgery  Subjective: intubated and sedated   Vitals:   07/04/19 1545 07/04/19 1615  BP: (!) 101/53   Pulse: 87   Resp: (!) 27   Temp:  (!) 97.3 F (36.3 C)  SpO2: 100%    Physical Exam: Cardiac: regular Lungs:  On vent Extremities: right above knee amputation dressed. Dressings clean, dry and intact Abdomen:  Obese, soft non distended Neurologic: sedated on vent  CBC    Component Value Date/Time   WBC 8.0 07/03/2019 0448   RBC 3.67 (L) 07/03/2019 0448   HGB 8.3 (L) 07/03/2019 0448   HGB 7.3 (L) 11/23/2017 1454   HCT 31.4 (L) 07/03/2019 0448   HCT 45.7 08/03/2013 2138   PLT 136 (L) 07/03/2019 0448   PLT 118 (L) 08/03/2013 2138   MCV 85.6 07/03/2019 0448   MCV 89 08/03/2013 2138   MCH 22.6 (L) 07/03/2019 0448   MCHC 26.4 (L) 07/03/2019 0448   RDW 26.3 (H) 07/03/2019 0448   RDW 13.6 08/03/2013 2138   LYMPHSABS 0.7 06/28/2019 0446   LYMPHSABS 3.4 08/03/2013 2138   MONOABS 1.2 (H) 06/28/2019 0446   MONOABS 0.7 08/03/2013 2138   EOSABS 0.2 06/28/2019 0446   EOSABS 0.3 08/03/2013 2138   BASOSABS 0.1 06/28/2019 0446   BASOSABS 0.1 08/03/2013 2138    BMET    Component Value Date/Time   NA 143 07/03/2019 0448   NA 139 08/03/2013 2138   K 4.2 07/03/2019 0448   K 3.4 (L) 08/03/2013 2138   CL 112 (H) 07/03/2019 0448   CL 104 08/03/2013 2138   CO2 21 (L) 07/03/2019 0448   CO2 27 08/03/2013 2138   GLUCOSE 116 (H) 07/03/2019 0448   GLUCOSE 153 (H) 08/03/2013 2138   BUN 36 (H) 07/03/2019 0448   BUN 16 08/03/2013 2138   CREATININE 2.00 (H) 07/03/2019 0448   CREATININE 1.19 08/03/2013 2138   CALCIUM 8.4 (L) 07/03/2019 0448   CALCIUM 9.2 08/03/2013 2138   GFRNONAA 26 (L) 07/03/2019 0448   GFRNONAA 50 (L) 08/03/2013 2138   GFRAA 30 (L) 07/03/2019 0448   GFRAA 58 (L) 08/03/2013 2138    INR    Component Value Date/Time   INR 1.5 (H) 07/01/2019 0736   INR 0.9 03/12/2012 1104     Intake/Output Summary (Last 24  hours) at 07/04/2019 1653 Last data filed at 07/04/2019 1600 Gross per 24 hour  Intake 650 ml  Output 950 ml  Net -300 ml     Assessment/Plan:  64 y.o. female is s/p right above knee amputation Day of Surgery. Doing well post op. Dressings clean, dry and intact. Hemodynamically stable. Medical management per primary team  DVT prophylaxis: Sq Heparin  Karoline Caldwell, [place] [facility] [phone] 07/04/2019 4:53 PM

## 2019-07-04 NOTE — Progress Notes (Signed)
  Progress Note    07/04/2019 8:27 AM * No surgery date entered *  Subjective:  No acute issues.  Vitals:   07/04/19 0000 07/04/19 0700  BP: 119/71   Pulse: 96   Resp: 19   Temp: 98.7 F (37.1 C) 98.1 F (36.7 C)  SpO2: 100%     Physical Exam: Awakens to stimuli only Right leg dressings in place with drainage of right foot.  CBC    Component Value Date/Time   WBC 8.0 07/03/2019 0448   RBC 3.67 (L) 07/03/2019 0448   HGB 8.3 (L) 07/03/2019 0448   HGB 7.3 (L) 11/23/2017 1454   HCT 31.4 (L) 07/03/2019 0448   HCT 45.7 08/03/2013 2138   PLT 136 (L) 07/03/2019 0448   PLT 118 (L) 08/03/2013 2138   MCV 85.6 07/03/2019 0448   MCV 89 08/03/2013 2138   MCH 22.6 (L) 07/03/2019 0448   MCHC 26.4 (L) 07/03/2019 0448   RDW 26.3 (H) 07/03/2019 0448   RDW 13.6 08/03/2013 2138   LYMPHSABS 0.7 06/28/2019 0446   LYMPHSABS 3.4 08/03/2013 2138   MONOABS 1.2 (H) 06/28/2019 0446   MONOABS 0.7 08/03/2013 2138   EOSABS 0.2 06/28/2019 0446   EOSABS 0.3 08/03/2013 2138   BASOSABS 0.1 06/28/2019 0446   BASOSABS 0.1 08/03/2013 2138    BMET    Component Value Date/Time   NA 143 07/03/2019 0448   NA 139 08/03/2013 2138   K 4.2 07/03/2019 0448   K 3.4 (L) 08/03/2013 2138   CL 112 (H) 07/03/2019 0448   CL 104 08/03/2013 2138   CO2 21 (L) 07/03/2019 0448   CO2 27 08/03/2013 2138   GLUCOSE 116 (H) 07/03/2019 0448   GLUCOSE 153 (H) 08/03/2013 2138   BUN 36 (H) 07/03/2019 0448   BUN 16 08/03/2013 2138   CREATININE 2.00 (H) 07/03/2019 0448   CREATININE 1.19 08/03/2013 2138   CALCIUM 8.4 (L) 07/03/2019 0448   CALCIUM 9.2 08/03/2013 2138   GFRNONAA 26 (L) 07/03/2019 0448   GFRNONAA 50 (L) 08/03/2013 2138   GFRAA 30 (L) 07/03/2019 0448   GFRAA 58 (L) 08/03/2013 2138    INR    Component Value Date/Time   INR 1.5 (H) 07/01/2019 0736   INR 0.9 03/12/2012 1104     Intake/Output Summary (Last 24 hours) at 07/04/2019 0827 Last data filed at 07/04/2019 0018 Gross per 24 hour  Intake  393.62 ml  Output 475 ml  Net -81.38 ml     Assessment:  64 y.o. female is here with non viable right leg.   Plan: Right aka today. I have discussed with patient's husband via telephone and he will give consent.   Cecila Satcher C. Donzetta Matters, MD [facility] [place] Office: [phone] Pager: [phone]  07/04/2019 8:27 AM

## 2019-07-04 NOTE — Progress Notes (Signed)
Ultram not given waste in stericycle with  Gildardo Cranker RN.

## 2019-07-04 NOTE — Progress Notes (Signed)
64 yo F s/p right AKA today.  Hgb 8.3 pre-op and now 5.5 in ICU.  Will order 2 upRBCs for transfusion.  Dressing changed in PACU after surgery for oozing and now dry in ICU.    [name], MD [facility] [place] Office: [place]

## 2019-07-04 NOTE — Transfer of Care (Signed)
Immediate Anesthesia Transfer of Care Note  Patient: [name]  Procedure(s) Performed: RIGHT AMPUTATION ABOVE KNEE (Right Knee)  Patient Location: PACU  Anesthesia Type:General  Level of Consciousness: awake and Patient remains intubated per anesthesia plan  Airway & Oxygen Therapy: Patient remains intubated per anesthesia plan and Patient placed on Ventilator (see vital sign flow sheet for setting)  Post-op Assessment: Report given to RN  Post vital signs: Reviewed and unstable  Last Vitals:  Vitals Value Taken Time  BP 125/94 07/04/19 1232  Temp    Pulse 83 07/04/19 1235  Resp 11 07/04/19 1235  SpO2 100 % 07/04/19 1235  Vitals shown include unvalidated device data.  Last Pain:  Vitals:   07/04/19 0800  TempSrc: Oral  PainSc:       Patients Stated Pain Goal: 1 (34/62/19 4712)  Complications: No complications documented.

## 2019-07-04 NOTE — Progress Notes (Signed)
Notfied 3E nurse, Nicholes Rough, that patient came to short stay with rings.  Rings have been removed and placed in a label pink cup and in a labeled bag.  Instructed Taiwo from [place] that rings needed to be picked up from short stay desk.

## 2019-07-04 NOTE — Progress Notes (Signed)
PROGRESS NOTE    [name]  XBD:[id number] DOB: 29-Aug-1955 DOA: 06/27/2019 PCP: [place]    Brief Narrative:64 year old female admitted from [place] facility with change in mental status.  At baseline she is able to carry a conversation and is oriented though she is bedbound.  Patient was initially admitted to [facility] long and transferred to [facility] for amputation.  However till today the husband had refused amputation however he he has agreed for amputation today 07/03/2019 Patient has chronic longstanding comorbidities including chronic systolic heart failure, type 2 diabetes, gastric ulcer, hepatitis C, CKD stage IIIb, history of MI COPD. Patient admitted to [facility] recently with group C strep bacteremia treated with antibiotics at that time transesophageal echo showed no evidence of endocarditis.   Assessment & Plan:   Active Problems:   NSTEMI (non-ST elevated myocardial infarction) (HCC)   COPD (chronic obstructive pulmonary disease) (HCC)   Anxiety   CAD (coronary artery disease)   Chronic back pain   Chronic hepatitis C without hepatic coma (HCC)   Diabetes mellitus type 2, uncontrolled (HCC)   Hypertension   Sleep apnea   Other cirrhosis of liver (HCC)   Acute metabolic encephalopathy   Type 2 diabetes mellitus with stage 3b chronic kidney disease, with long-term current use of insulin (HCC)   Chronic systolic CHF (congestive heart failure) (HCC)   Venous stasis ulcer ([place])   Advanced care planning/counseling discussion   Respiratory acidosis   Hyperkalemia  #1  Right foot abscess/chronic venous stasis ulcers/left leg cellulitis MRI evidence of multiloculated fluid collection concerning for abscess in the right foot.    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 Continue broad-spectrum antibiotics.  MRSA PCR positive.  On vancomycin.  Follow-up renal functions today.  #2  Acute metabolic encephalopathy in the setting of  infection and hypoxia and hypercapnia.  She was awake when I saw her today but was moaning and groaning without following any commands or conversation.  MRI of the brain negative for any acute findings.  #3 acute on chronic systolic CHF ejection fraction 40 to 45% without wall motion abnormalities.  She was initially treated with IV diuretics which was on hold due to increasing creatinine.  She is getting Lasix as needed.  She is on 2 L satting 98%. Negative by 8341  #4 AKI on CKD stage IIIb baseline creatinine 1.7-1.9.  Creatinine 2.0 same as yesterday.  Use as needed Lasix.  #5 history of hepatic encephalopathy on lactulose.  Check ammonia level  #6 history of chronic anemia hemoglobin 8.3  #7 hepatitis C/cirrhosis follow  #8 type 2 diabetes A1c 7.4%  CBG (last 3)  Recent Labs (last 2 labs)        Recent Labs    07/02/19 2138 07/03/19 0627 07/03/19 1148  GLUCAP 128* 109* 110*      #9 pressure injury to the right foot left heel and right medial foot present on admission as below.    Pressure Injury 06/27/19 Foot Anterior;Right Unstageable - Full thickness tissue loss in which the base of the injury is covered by slough (yellow, tan, gray, green or brown) and/or eschar (tan, brown or black) in the wound bed. (Active)  06/27/19 2040  Location: Foot  Location Orientation: Anterior;Right  Staging: Unstageable - Full thickness tissue loss in which the base of the injury is covered by slough (yellow, tan, gray, green or brown) and/or eschar (tan, brown or black) in the wound bed.  Wound Description (Comments):  Present on Admission: Yes     Pressure Injury 06/27/19 Heel Left Deep Tissue Pressure Injury - Purple or maroon localized area of discolored intact skin or blood-filled blister due to damage of underlying soft tissue from pressure and/or shear. (Active)  06/27/19 2040  Location: Heel  Location Orientation: Left  Staging: Deep Tissue Pressure Injury - Purple or  maroon localized area of discolored intact skin or blood-filled blister due to damage of underlying soft tissue from pressure and/or shear.  Wound Description (Comments):   Present on Admission:      Pressure Injury 06/27/19 Foot Right;Medial Unstageable - Full thickness tissue loss in which the base of the injury is covered by slough (yellow, tan, gray, green or brown) and/or eschar (tan, brown or black) in the wound bed. (Active)  06/27/19 2040  Location: Foot  Location Orientation: Right;Medial  Staging: Unstageable - Full thickness tissue loss in which the base of the injury is covered by slough (yellow, tan, gray, green or brown) and/or eschar (tan, brown or black) in the wound bed.  Wound Description (Comments):   Present on Admission:      Pressure Injury 07/03/19 Thigh Left;Medial Stage 2 -  Partial thickness loss of dermis presenting as a shallow open injury with a red, pink wound bed without slough. Medical Device (Active)  07/03/19 1136  Location: Thigh  Location Orientation: Left;Medial  Staging: Stage 2 -  Partial thickness loss of dermis presenting as a shallow open injury with a red, pink wound bed without slough.  Wound Description (Comments): Medical Device  Present on Admission: No     Pressure Injury 07/03/19 Thigh Right;Medial Stage 2 -  Partial thickness loss of dermis presenting as a shallow open injury with a red, pink wound bed without slough. medical device (Active)  07/03/19 1137  Location: Thigh  Location Orientation: Right;Medial  Staging: Stage 2 -  Partial thickness loss of dermis presenting as a shallow open injury with a red, pink wound bed without slough.  Wound Description (Comments): medical device  Present on Admission: No      Nutrition Problem: Increased nutrient needs Etiology: wound healing     Signs/Symptoms: estimated needs    Interventions: Ensure Enlive (each supplement provides 350kcal and 20 grams of protein), MVI  Estimated  body mass index is 39.84 kg/m as calculated from the following:   Height as of this encounter: 5' 5"  (1.651 m).   Weight as of this encounter: 108.6 kg. DVT prophylaxis: Heparin Code Status: Full code Family Communication: None at bedside Disposition Plan:  Status is: Inpatient  Dispo: The patient is from: Nursing home  Anticipated d/c is to: Nursing home  Anticipated d/c date is: Unknown  Patient currently is not medically stable to d/c.  Admitted with right foot cellulitis now with abscess with acute metabolic encephalopathy   Consultants: Palliative care and vascular surgery  Procedures: None Antimicrobials: Anti-infectives (From admission, onward)   Start     Dose/Rate Route Frequency Ordered Stop   07/02/19 1200  [MAR Hold]  vancomycin (VANCOCIN) IVPB 1000 mg/200 mL premix     Discontinue     (MAR Hold since Thu 07/04/2019 at 0942.Hold Reason: Transfer to a Procedural area.)   1,000 mg 200 mL/hr over 60 Minutes Intravenous Every 48 hours 07/01/19 1228     06/28/19 1000  vancomycin (VANCOREADY) IVPB 1250 mg/250 mL  Status:  Discontinued        1,250 mg 166.7 mL/hr over 90 Minutes Intravenous Every 24 hours 06/27/19  1223 07/01/19 1213   06/27/19 1300  [MAR Hold]  ceFEPIme (MAXIPIME) 2 g in sodium chloride 0.9 % 100 mL IVPB     Discontinue     (MAR Hold since Thu 07/04/2019 at 0942.Hold Reason: Transfer to a Procedural area.)   2 g 200 mL/hr over 30 Minutes Intravenous Every 12 hours 06/27/19 1223     06/27/19 1230  vancomycin (VANCOREADY) IVPB 2000 mg/400 mL        2,000 mg 200 mL/hr over 120 Minutes Intravenous  Once 06/27/19 1223 06/27/19 1743        Subjective:   Objective: Vitals:   07/04/19 1254 07/04/19 1300 07/04/19 1315 07/04/19 1330  BP:  (!) 117/56 (!) 109/54   Pulse: 83 78  79  Resp: (!) 7 (!) 4  16  Temp:      TempSrc:      SpO2: 100% 100%  100%  Weight:      Height:        Intake/Output Summary (Last 24 hours) at  07/04/2019 1443 Last data filed at 07/04/2019 1153 Gross per 24 hour  Intake 689.53 ml  Output 550 ml  Net 139.53 ml   Filed Weights   07/02/19 0111 07/03/19 0027 07/04/19 0000  Weight: 108.4 kg 110 kg 108.6 kg    Examination:  General exam: Appears calm and comfortable  Respiratory system: Clear to auscultation. Respiratory effort normal. Cardiovascular system: S1 & S2 heard, RRR. No JVD, murmurs, rubs, gallops or clicks. No pedal edema. Gastrointestinal system: Abdomen is nondistended, soft and nontender. No organomegaly or masses felt. Normal bowel sounds heard. Central nervous system: Alert and oriented. No focal neurological deficits. Extremities:bandages on both lower ext. Skin: No rashes, lesions or ulcers Psychiatry: Judgement and insight appear normal. Mood & affect appropriate.     Data Reviewed: I have personally reviewed following labs and imaging studies  CBC: Recent Labs  Lab 06/28/19 0446 06/30/19 0721 07/01/19 0736 07/02/19 0827 07/03/19 0448  WBC 8.4 7.2 6.6 9.0 8.0  NEUTROABS 6.2  --   --   --   --   HGB 8.8* 8.6* 8.6* 9.1* 8.3*  HCT 32.9* 32.5* 32.8* 34.6* 31.4*  MCV 83.7 84.6 85.0 84.4 85.6  PLT 155 159 153 140* 323*   Basic Metabolic Panel: Recent Labs  Lab 06/28/19 0617 06/30/19 0721 07/01/19 0736 07/02/19 0827 07/03/19 0448  NA 141 143 144 145 143  K 5.2* 4.3 4.4 4.5 4.2  CL 108 114* 115* 116* 112*  CO2 22 22 20* 22 21*  GLUCOSE 90 126* 90 119* 116*  BUN 45* 40* 39* 37* 36*  CREATININE 2.41* 2.33* 2.09* 2.00* 2.00*  CALCIUM 8.1* 8.1* 8.2* 8.5* 8.4*  MG 1.8 1.9  --   --   --   PHOS 4.6  --   --   --   --    GFR: Estimated Creatinine Clearance: 34.8 mL/min (A) (by C-G formula based on SCr of 2 mg/dL (H)). Liver Function Tests: Recent Labs  Lab 06/28/19 0617 06/30/19 0721  AST 24 19  ALT 14 12  ALKPHOS 167* 168*  BILITOT 0.8 0.9  PROT 8.0 8.4*  ALBUMIN 1.9* 1.8*   No results for input(s): LIPASE, AMYLASE in the last 168  hours. Recent Labs  Lab 06/29/19 1425  AMMONIA 40*   Coagulation Profile: Recent Labs  Lab 07/01/19 0736  INR 1.5*   Cardiac Enzymes: No results for input(s): CKTOTAL, CKMB, CKMBINDEX, TROPONINI in the last 168 hours. BNP (last  3 results) No results for input(s): PROBNP in the last 8760 hours. HbA1C: No results for input(s): HGBA1C in the last 72 hours. CBG: Recent Labs  Lab 07/03/19 1710 07/03/19 2326 07/04/19 0619 07/04/19 0914 07/04/19 1231  GLUCAP 103* 113* 93 99 111*   Lipid Profile: No results for input(s): CHOL, HDL, LDLCALC, TRIG, CHOLHDL, LDLDIRECT in the last 72 hours. Thyroid Function Tests: No results for input(s): TSH, T4TOTAL, FREET4, T3FREE, THYROIDAB in the last 72 hours. Anemia Panel: No results for input(s): VITAMINB12, FOLATE, FERRITIN, TIBC, IRON, RETICCTPCT in the last 72 hours. Sepsis Labs: No results for input(s): PROCALCITON, LATICACIDVEN in the last 168 hours.  Recent Results (from the past 240 hour(s))  Blood culture (routine x 2)     Status: Abnormal   Collection Time: 06/27/19  5:46 AM   Specimen: BLOOD LEFT HAND  Result Value Ref Range Status   Specimen Description   Final    BLOOD LEFT HAND Performed at [facility] [address] [place], [place] [address]    Special Requests   Final    BOTTLES DRAWN AEROBIC ONLY Blood Culture adequate volume Performed at [facility] [address], [place], [place] [address]    Culture  Setup Time   Final    GRAM POSITIVE RODS AEROBIC BOTTLE ONLY CRITICAL RESULT CALLED TO, READ BACK BY AND VERIFIED WITH: PHARMD L POINDEXTER [id number] AT 1016 BY CM    Culture (A)  Final    DIPHTHEROIDS(CORYNEBACTERIUM SPECIES) Standardized susceptibility testing for this organism is not available. Performed at [facility], [place] [address] [place], [place] [address]    Report Status 06/29/2019 FINAL  Final  SARS Coronavirus 2 by RT PCR (hospital order, performed in  [facility] hospital lab) Nasopharyngeal Nasopharyngeal Swab     Status: None   Collection Time: 06/27/19  6:26 AM   Specimen: Nasopharyngeal Swab  Result Value Ref Range Status   SARS Coronavirus 2 NEGATIVE NEGATIVE Final    Comment: (NOTE) SARS-CoV-2 target nucleic acids are NOT DETECTED.  The SARS-CoV-2 RNA is generally detectable in upper and lower respiratory specimens during the acute phase of infection. The lowest concentration of SARS-CoV-2 viral copies this assay can detect is 250 copies / mL. A negative result does not preclude SARS-CoV-2 infection and should not be used as the sole basis for treatment or other patient management decisions.  A negative result may occur with improper specimen collection / handling, submission of specimen other than nasopharyngeal swab, presence of viral mutation(s) within the areas targeted by this assay, and inadequate number of viral copies (<250 copies / mL). A negative result must be combined with clinical observations, patient history, and epidemiological information.  Fact Sheet for Patients:   StrictlyIdeas.no  Fact Sheet for Healthcare Providers: BankingDealers.co.za  This test is not yet approved or  cleared by the [place] FDA and has been authorized for detection and/or diagnosis of SARS-CoV-2 by FDA under an Emergency Use Authorization (EUA).  This EUA will remain in effect (meaning this test can be used) for the duration of the COVID-19 declaration under Section 564(b)(1) of the Act, 21 U.S.C. section 360bbb-3(b)(1), unless the authorization is terminated or revoked sooner.  Performed at [facility], [place] [address], [place], [place] [address]   Blood culture (routine x 2)     Status: None   Collection Time: 06/27/19  6:30 AM   Specimen: BLOOD  Result Value Ref Range Status   Specimen  Description   Final    BLOOD BLOOD RIGHT FOREARM Performed at  [facility] [address], [place], [place] [address]    Special Requests   Final    BOTTLES DRAWN AEROBIC ONLY Blood Culture results may not be optimal due to an inadequate volume of blood received in culture bottles Performed at [facility] [address] [place], [place] [address]    Culture   Final    NO GROWTH 5 DAYS Performed at [facility], [place] [address], [place], [place] [address]    Report Status 07/02/2019 FINAL  Final  Urine culture     Status: None   Collection Time: 06/27/19 12:42 PM   Specimen: Urine, Random  Result Value Ref Range Status   Specimen Description   Final    URINE, RANDOM Performed at [facility] [address], [place], [place] [address]    Special Requests   Final    NONE Performed at [facility], [place] [address], [place], [place] [address]    Culture   Final    NO GROWTH Performed at [facility], [place] [address], [place], [place] [address]    Report Status 06/28/2019 FINAL  Final  Surgical pcr screen     Status: Abnormal   Collection Time: 07/04/19  8:51 AM   Specimen: Nasal Mucosa; Nasal Swab  Result Value Ref Range Status   MRSA, PCR POSITIVE (A) NEGATIVE Final    Comment: RESULT CALLED TO, READ BACK BY AND VERIFIED WITH: T. Tijani RN 11:25 07/04/19 ([email or web address])    Staphylococcus aureus POSITIVE (A) NEGATIVE Final    Comment: (NOTE) The Xpert SA Assay (FDA approved for NASAL specimens in patients 55 years of age and older), is one component of a comprehensive surveillance program. It is not intended to diagnose infection nor to guide or monitor treatment. Performed at [facility], [place] [address], [place], [place] [address]          Radiology Studies: No results found.      Scheduled Meds: . [MAR Hold] atorvastatin  40 mg Oral Daily  . chlorhexidine  15 mL Mouth/Throat Once   Or  . mouth rinse  15 mL Mouth Rinse Once  .  chlorhexidine      . [MAR Hold] Chlorhexidine Gluconate Cloth  6 each Topical Daily  . [MAR Hold] feeding supplement (ENSURE ENLIVE)  237 mL Oral TID BM  . [MAR Hold] folic acid  1 mg Oral Daily  . [MAR Hold] heparin  5,000 Units Subcutaneous Q8H  . [MAR Hold] hydrocortisone cream   Topical BID  . [MAR Hold] insulin aspart  0-15 Units Subcutaneous TID WC  . [MAR Hold] insulin aspart  0-5 Units Subcutaneous QHS  . [MAR Hold] lactulose  20 g Oral TID  . [MAR Hold] LORazepam  2-4 mg Intravenous Once  . [MAR Hold] mirtazapine  30 mg Oral QHS  . [MAR Hold] multivitamin with minerals  1 tablet Oral Daily   Continuous Infusions: . [MAR Hold] sodium chloride 250 mL (07/03/19 2143)  . sodium chloride    . [MAR Hold] ceFEPime (MAXIPIME) IV 2 g (07/04/19 0930)  . [MAR Hold] vancomycin 1,000 mg (07/02/19 1229)     LOS: 7 days     Georgette Shell, MD  07/04/2019, 2:43 PM

## 2019-07-04 NOTE — Progress Notes (Signed)
Patient was transported to 70M08 without any complications. Report was given to 70M RT.

## 2019-07-05 ENCOUNTER — Telehealth (HOSPITAL_COMMUNITY): Payer: Self-pay

## 2019-07-05 ENCOUNTER — Inpatient Hospital Stay (HOSPITAL_COMMUNITY): Payer: Medicaid Other

## 2019-07-05 LAB — COMPREHENSIVE METABOLIC PANEL
ALT: 10 U/L (ref 0–44)
AST: 16 U/L (ref 15–41)
Albumin: 1.7 g/dL — ABNORMAL LOW (ref 3.5–5.0)
Alkaline Phosphatase: 101 U/L (ref 38–126)
Anion gap: 8 (ref 5–15)
BUN: 37 mg/dL — ABNORMAL HIGH (ref 8–23)
CO2: 22 mmol/L (ref 22–32)
Calcium: 8.1 mg/dL — ABNORMAL LOW (ref 8.9–10.3)
Chloride: 116 mmol/L — ABNORMAL HIGH (ref 98–111)
Creatinine, Ser: 2.1 mg/dL — ABNORMAL HIGH (ref 0.44–1.00)
GFR calc Af Amer: 28 mL/min — ABNORMAL LOW (ref >60)
GFR calc non Af Amer: 24 mL/min — ABNORMAL LOW (ref >60)
Glucose, Bld: 138 mg/dL — ABNORMAL HIGH (ref 70–99)
Potassium: 4.7 mmol/L (ref 3.5–5.1)
Sodium: 146 mmol/L — ABNORMAL HIGH (ref 135–145)
Total Bilirubin: 1.7 mg/dL — ABNORMAL HIGH (ref 0.3–1.2)
Total Protein: 7 g/dL (ref 6.5–8.1)

## 2019-07-05 LAB — CBC
HCT: 23.4 % — ABNORMAL LOW (ref 36.0–46.0)
Hemoglobin: 6.7 g/dL — CL (ref 12.0–15.0)
MCH: 23.2 pg — ABNORMAL LOW (ref 26.0–34.0)
MCHC: 28.6 g/dL — ABNORMAL LOW (ref 30.0–36.0)
MCV: 81 fL (ref 80.0–100.0)
Platelets: 125 10*3/uL — ABNORMAL LOW (ref 150–400)
RBC: 2.89 MIL/uL — ABNORMAL LOW (ref 3.87–5.11)
RDW: 25.4 % — ABNORMAL HIGH (ref 11.5–15.5)
WBC: 8.3 10*3/uL (ref 4.0–10.5)
nRBC: 0.4 % — ABNORMAL HIGH (ref 0.0–0.2)

## 2019-07-05 LAB — GLUCOSE, CAPILLARY
Glucose-Capillary: 119 mg/dL — ABNORMAL HIGH (ref 70–99)
Glucose-Capillary: 119 mg/dL — ABNORMAL HIGH (ref 70–99)
Glucose-Capillary: 130 mg/dL — ABNORMAL HIGH (ref 70–99)
Glucose-Capillary: 139 mg/dL — ABNORMAL HIGH (ref 70–99)
Glucose-Capillary: 161 mg/dL — ABNORMAL HIGH (ref 70–99)
Glucose-Capillary: 95 mg/dL (ref 70–99)

## 2019-07-05 LAB — AMMONIA: Ammonia: 20 umol/L (ref 9–35)

## 2019-07-05 LAB — BRAIN NATRIURETIC PEPTIDE: B Natriuretic Peptide: 732.2 pg/mL — ABNORMAL HIGH (ref 0.0–100.0)

## 2019-07-05 LAB — TROPONIN I (HIGH SENSITIVITY): Troponin I (High Sensitivity): 18 ng/L — ABNORMAL HIGH (ref <18)

## 2019-07-05 LAB — PREPARE RBC (CROSSMATCH)

## 2019-07-05 MED ORDER — ACETAMINOPHEN 325 MG PO TABS
650.0000 mg | ORAL_TABLET | Freq: Four times a day (QID) | ORAL | Status: DC | PRN
  Administered 2019-07-09 – 2019-07-20 (×2): 650 mg
  Filled 2019-07-05 (×2): qty 2

## 2019-07-05 MED ORDER — SODIUM CHLORIDE 0.9% IV SOLUTION: Freq: Once | INTRAVENOUS | Status: AC

## 2019-07-05 MED ORDER — ACETAMINOPHEN 650 MG RE SUPP: 650.0000 mg | Freq: Four times a day (QID) | RECTAL | Status: DC | PRN

## 2019-07-05 MED ORDER — PANTOPRAZOLE SODIUM 40 MG PO PACK
40.0000 mg | PACK | Freq: Every day | ORAL | Status: DC
  Administered 2019-07-05 – 2019-07-11 (×6): 40 mg
  Filled 2019-07-05 (×8): qty 20

## 2019-07-05 MED ORDER — ADULT MULTIVITAMIN W/MINERALS CH
1.0000 | ORAL_TABLET | Freq: Every day | ORAL | Status: DC
  Administered 2019-07-05 – 2019-07-11 (×7): 1
  Filled 2019-07-05 (×7): qty 1

## 2019-07-05 MED ORDER — FOLIC ACID 1 MG PO TABS
1.0000 mg | ORAL_TABLET | Freq: Every day | ORAL | Status: DC
  Administered 2019-07-05 – 2019-07-11 (×7): 1 mg
  Filled 2019-07-05 (×7): qty 1

## 2019-07-05 MED ORDER — MIRTAZAPINE 30 MG PO TABS
30.0000 mg | ORAL_TABLET | Freq: Every day | ORAL | Status: DC
  Administered 2019-07-05 – 2019-07-06 (×2): 30 mg
  Filled 2019-07-05 (×2): qty 1

## 2019-07-05 MED ORDER — DOCUSATE SODIUM 50 MG/5ML PO LIQD
100.0000 mg | Freq: Every day | ORAL | Status: DC
  Administered 2019-07-05 – 2019-07-11 (×7): 100 mg
  Filled 2019-07-05 (×7): qty 10

## 2019-07-05 MED ORDER — ENSURE ENLIVE PO LIQD
237.0000 mL | Freq: Three times a day (TID) | ORAL | Status: DC
  Administered 2019-07-05 – 2019-07-06 (×2): 237 mL

## 2019-07-05 MED ORDER — ATORVASTATIN CALCIUM 40 MG PO TABS
40.0000 mg | ORAL_TABLET | Freq: Every day | ORAL | Status: DC
  Administered 2019-07-05 – 2019-07-11 (×7): 40 mg
  Filled 2019-07-05 (×7): qty 1

## 2019-07-05 MED ORDER — LACTULOSE 10 GM/15ML PO SOLN
20.0000 g | Freq: Three times a day (TID) | ORAL | Status: DC
  Administered 2019-07-07 – 2019-07-11 (×13): 20 g
  Filled 2019-07-05 (×14): qty 30

## 2019-07-05 MED ORDER — MUPIROCIN 2 % EX OINT
TOPICAL_OINTMENT | Freq: Two times a day (BID) | CUTANEOUS | Status: DC
  Administered 2019-07-07 – 2019-07-08 (×3): 1 via NASAL
  Filled 2019-07-05 (×4): qty 22

## 2019-07-05 MED ORDER — VITAL HIGH PROTEIN PO LIQD
1000.0000 mL | ORAL | Status: DC
  Administered 2019-07-05 – 2019-07-10 (×7): 1000 mL
  Filled 2019-07-05: qty 1000

## 2019-07-05 NOTE — Progress Notes (Signed)
OT Cancellation Note  Patient Details Name: [name] MRN: [id number] DOB: 09/24/1955   Cancelled Treatment:    Reason Eval/Treat Not Completed: Medical issues which prohibited therapy; intubated, per RN with no command follow, requesting to hold therapies at this time. Will follow up for OT eval as able.  Lou Cal, OT Acute Rehabilitation Services Pager [phone] Office [phone]   Raymondo Band 07/05/2019, 10:17 AM

## 2019-07-05 NOTE — Telephone Encounter (Signed)
No answer to both numbers listed for patient's husband to introduce myself and role. I was able to leave a message on mobile number with call back number for a return call.   Cletis Media RN BSN CWS [place]

## 2019-07-05 NOTE — Progress Notes (Signed)
PT Cancellation Note  Patient Details Name: YURY SCHAUS MRN: [id number] DOB: 1955-08-31   Cancelled Treatment:    Reason Eval/Treat Not Completed: Patient not medically ready.  Not yet clear from sedation and unable to participate. 07/05/2019  Ginger Carne., PT Acute Rehabilitation Services [phone]  (pager) [phone]  (office)   Tessie Fass Amier Hoyt 07/05/2019, 2:26 PM

## 2019-07-05 NOTE — Progress Notes (Addendum)
Progress Note    07/05/2019 7:04 AM 1 Day Post-Op  Subjective:  64 yo s/p right AKA secondary to full thickness tissue loss of right foot.  Was unable to be extubated post-operatively.  Acute blood loss anemia s/p 2u PRBCs yesterday. CT of head yesterday secondary to discordant pupils.  Intubated. Opens eyes to name. No vasopressors   Vitals:   07/05/19 0530 07/05/19 0540  BP: (!) 128/52   Pulse:  93  Resp:  13  Temp:    SpO2:  100%   FIo2: 40%  PEEP 5 Physical Exam: Cardiac:  RRR Lungs:  CTAB Extremities:  Right residual limb with Coban dressing in place. Abdomen:  Soft, ND  CBC    Component Value Date/Time   WBC 12.4 (H) 07/04/2019 1756   RBC 2.44 (L) 07/04/2019 1756   HGB 5.5 (LL) 07/04/2019 1756   HGB 7.3 (L) 11/23/2017 1454   HCT 20.6 (L) 07/04/2019 1756   HCT 45.7 08/03/2013 2138   PLT 133 (L) 07/04/2019 1756   PLT 118 (L) 08/03/2013 2138   MCV 84.4 07/04/2019 1756   MCV 89 08/03/2013 2138   MCH 22.5 (L) 07/04/2019 1756   MCHC 26.7 (L) 07/04/2019 1756   RDW 26.7 (H) 07/04/2019 1756   RDW 13.6 08/03/2013 2138   LYMPHSABS 0.7 06/28/2019 0446   LYMPHSABS 3.4 08/03/2013 2138   MONOABS 1.2 (H) 06/28/2019 0446   MONOABS 0.7 08/03/2013 2138   EOSABS 0.2 06/28/2019 0446   EOSABS 0.3 08/03/2013 2138   BASOSABS 0.1 06/28/2019 0446   BASOSABS 0.1 08/03/2013 2138    BMET    Component Value Date/Time   NA 146 (H) 07/04/2019 1636   NA 139 08/03/2013 2138   K 4.7 07/04/2019 1636   K 3.4 (L) 08/03/2013 2138   CL 117 (H) 07/04/2019 1636   CL 104 08/03/2013 2138   CO2 21 (L) 07/04/2019 1636   CO2 27 08/03/2013 2138   GLUCOSE 124 (H) 07/04/2019 1636   GLUCOSE 153 (H) 08/03/2013 2138   BUN 34 (H) 07/04/2019 1636   BUN 16 08/03/2013 2138   CREATININE 1.92 (H) 07/04/2019 1636   CREATININE 1.19 08/03/2013 2138   CALCIUM 8.1 (L) 07/04/2019 1636   CALCIUM 9.2 08/03/2013 2138   GFRNONAA 27 (L) 07/04/2019 1636   GFRNONAA 50 (L) 08/03/2013 2138   GFRAA 31 (L)  07/04/2019 1636   GFRAA 58 (L) 08/03/2013 2138     Intake/Output Summary (Last 24 hours) at 07/05/2019 0704 Last data filed at 07/05/2019 0541 Gross per 24 hour  Intake 2333.15 ml  Output 1950 ml  Net 383.15 ml    HOSPITAL MEDICATIONS Scheduled Meds: . atorvastatin  40 mg Oral Daily  . Chlorhexidine Gluconate Cloth  6 each Topical Daily  . docusate sodium  100 mg Oral Daily  . feeding supplement (ENSURE ENLIVE)  237 mL Oral TID BM  . folic acid  1 mg Oral Daily  . furosemide  40 mg Intravenous Q8H  . heparin  5,000 Units Subcutaneous Q8H  . hydrocortisone cream   Topical BID  . insulin aspart  0-15 Units Subcutaneous TID WC  . insulin aspart  0-5 Units Subcutaneous QHS  . lactulose  20 g Oral TID  . mirtazapine  30 mg Oral QHS  . multivitamin with minerals  1 tablet Oral Daily  . pantoprazole  40 mg Oral Daily   Continuous Infusions: . sodium chloride Stopped (07/04/19 0107)  . ceFEPime (MAXIPIME) IV Stopped (07/04/19 2124)  . dexmedetomidine (  PRECEDEX) IV infusion Stopped (07/04/19 1903)  . vancomycin 1,000 mg (07/02/19 1229)   PRN Meds:.sodium chloride, acetaminophen **OR** acetaminophen, fentaNYL (SUBLIMAZE) injection  CT head: FINDINGS: Brain: No sign of acute infarction. Chronic small-vessel ischemic changes of the cerebral hemispheric white matter. No mass lesion, hemorrhage, hydrocephalus or extra-axial collection.  Assessment: Acute respiratory failure post-op right AKA. No apparent post-op bleeding . S/p transfusion. AM labs pending. Full vent support  Plan: -Management as per CCM.  Remove surgical dressing tomorrow -DVT prophylaxis:  Heparin  AFB   Risa Grill, PA-C [facility] [phone] 07/05/2019  7:04 AM   I have seen and evaluated the patient. I agree with the PA note as documented above. POD#1 s/p R AKA.  Dressing dry overnight and this am.  Will remove at bedside tomorrow.  Remains intubated since surgery and unable to  extubate due to mental status and has encephalopathy pre-op.  Got 2 upRBC overnight for Hgb 5.5 (8.2 pre-op).  [name], MD [facility] [place] Office: [phone]

## 2019-07-05 NOTE — Procedures (Signed)
Cortrak  Person Inserting Tube:  Markelle Asaro, Creola Corn, RD Tube Type:  Cortrak - 43 inches Tube Location:  Left nare Initial Placement:  Stomach Secured by: Bridle Technique Used to Measure Tube Placement:  Documented cm marking at nare/ corner of mouth Cortrak Secured At:  66 cm    Cortrak Tube Team Note:  Consult received to place a Cortrak feeding tube.   No x-ray is required. RN may begin using tube.    If the tube becomes dislodged please keep the tube and contact the Cortrak team at [email or web address] (password [email or web address]) for replacement.  If after hours and replacement cannot be delayed, place a NG tube and confirm placement with an abdominal x-ray.    Larkin Ina, MS, RD, LDN RD pager number and weekend/on-call pager number located in [place].

## 2019-07-05 NOTE — Anesthesia Postprocedure Evaluation (Signed)
Anesthesia Post Note  Patient: [name]  Procedure([name]) Performed: RIGHT AMPUTATION ABOVE KNEE (Right Knee)     Patient location during evaluation: PACU Anesthesia Type: General Level of consciousness: awake Pain management: pain level controlled Vital Signs Assessment: post-procedure vital signs reviewed and stable Respiratory status: patient remains intubated per anesthesia plan (Without vent support, patient has shallow breathing) Cardiovascular status: blood pressure returned to baseline and stable Postop Assessment: no apparent nausea or vomiting Anesthetic complications: no Comments: Pt remains intubated post-operatively due to diminished respiratory effort.  Spontaneous Vt around 150cc.  Critical care consulted for ICU management.   Encounter Complications  Complication Outcome Phase Comment  Unable to extubate  Intraprocedure     Last Vitals:  Vitals:   07/05/19 1745 07/05/19 1800  BP: (!) 95/47 (!) 86/47  Pulse: 90 93  Resp: 16 18  Temp: (P) 36.7 C   SpO2: 94% 93%    Last Pain:  Vitals:   07/05/19 1745  TempSrc: (P) Axillary  PainSc:                  [name]

## 2019-07-05 NOTE — Consult Note (Signed)
PV Navigator consult acknowledged and chart reviewed.  Pt remains in ICU s/p R AKA. Noted reports that she is not following commands, but would open her eyes when her name was called.  Will plan to reach out to patient's husband to introduce myself and my role and offer assistance to any barriers or answer any questions.   Will follow and be available to this patient and husband as her status transitions.  Thank you, Cletis Media RN BSN CWS [place]

## 2019-07-05 NOTE — Progress Notes (Signed)
Nutrition Follow-up  DOCUMENTATION CODES:   Obesity unspecified  INTERVENTION:   Initiate tube feeding via Cortrak: Vital High Protein at 55 ml/h (1320 ml per day)  Provides 1320 kcal, 116 gm protein, 1104 ml free water daily  NUTRITION DIAGNOSIS:   Increased nutrient needs related to wound healing as evidenced by estimated needs.  Ongoing   GOAL:   Patient will meet greater than or equal to 90% of their needs  Progressing  MONITOR:   Vent status, TF tolerance, Labs  REASON FOR ASSESSMENT:   Rounds Enteral/tube feeding initiation and management  ASSESSMENT:   Patient with PMH significant for CHF, NSTEMI, COPD, DM, gastric ulcer, chronic hepatitis C, cirrhosis of liver, and CKD III. Presents this admission with acute exacerbation CHF and bilateral lower extremity wounds.  Discussed patient in ICU rounds and with RN today. 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 Unable to extubate post-op.  Cortrak was placed today, tip is in the stomach. RD to order TF.  Patient is currently intubated on ventilator support MV: 8.4 L/min Temp (24hrs), Avg:97.9 F (36.6 C), Min:97.3 F (36.3 C), Max:98.2 F (36.8 C)   Labs reviewed. Na 146 (H), BUN 37 (H), creat 2.1 (H) CBG: [phone]  Medications reviewed and include colace, folic acid, lasix, novolog, lactulose, remeron, MVI with minerals.  Weight down to 108.6 kg today, 115 kg on admission.  Diet Order:   Diet Order            Diet NPO time specified  Diet effective now                 EDUCATION NEEDS:   Not appropriate for education at this time  Skin:  Skin Assessment: Skin Integrity Issues: Skin Integrity Issues:: Other (Comment), DTI, Unstageable DTI: L heel Unstageable: L thigh, R foot Other: bilateral L/R pretibial wounds  Last BM:  6/16 type 6  Height:   Ht Readings from Last 1 Encounters:  06/28/19 5' 5"  (1.651 m)    Weight:   Wt Readings from Last 1 Encounters:  07/04/19 108.6 kg    Ideal weight: 52.3 kg  (adjusted for AKA)  BMI: 43.3 (adjusted for AKA)  Estimated Nutritional Needs:   Kcal:  1200-1520  Protein:  >/=105 gm  Fluid:  >/= 1.9 L/day    Lucas Mallow, RD, LDN, CNSC Please refer to Amion for contact information.

## 2019-07-05 NOTE — Progress Notes (Signed)
Pt has been on abx since 6/10 for her foot abscess and cellulitis. She is s/p AKA. Ok to [employer] [facility]	[place] on 6/19 per Risa Grill, PA.   Onnie Boer, PharmD, BCIDP, AAHIVP, CPP Infectious Disease Pharmacist 07/05/2019 10:34 AM

## 2019-07-05 NOTE — Progress Notes (Signed)
NAME:  [name], MRN:  [id number], DOB:  1955-05-14, LOS: 54 ADMISSION DATE:  06/27/2019, CONSULTATION DATE:  07/04/19 REFERRING MD:  Dr. Carlis Abbott, CHIEF COMPLAINT:  Post-op respiratory failure  Brief History   [name] is a 64yo f with a PMHx of diastolic CHF with anasarca, NSTEMI, COPD, DMII uncontrolled, anemia, CKD IIIB, hepatitis C with cirrhosis who presented for altered mental status. 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 She was unable to be extubated following surgery. CCM was consulted for ventilator management.  History of present illness   [name] is a 64yo f with a PMHx of diastolic CHF with anasarca, NSTEMI, COPD, DMII uncontrolled, anemia, CKD IIIB, hepatitis C with cirrhosis who presented [name] from [place] [facility] for altered mental status. Per chart review, patient's nurse at the nursing facility had noted that she had been moaning and groaning without coherent conversation, though at baseline she is oriented and able to converse normally. Pt was also noted to be hypoxic to 58% while at nursing facility. She does not normally wear O2 at home. While at [facility], pt also developed desaturations and was placed and maintained on BiPAP with improvement in hypoxia. She developed worsening lethargy and unresponsiveness on 6/14 that required her to be placed on BiPAP again. Pt also underwent MRI that showed R foot abscess/myositis/tenosynovitis as well as L foot myositis and leg cellulitis. Not thought to be osteomyelitis. Vascular surgery was consulted and recommended right above knee amputation and left angiography. Patient's husband had refused amputation and consented to have R AKA on 6/17. Patient was transferred to [facility] for the procedure. She underwent AKA on 1/85 without complication. Extubation was attempted in the OR including with pressure support  but patient was unable to generate adequate volumes. She also was unable to be weaned while at PACU.  Notably, patient was admitted at [facility] between 5/9-5/20/21 for Group c strep bacteremia and bilateral leg edema, as well as R leg superficial wounds and cellulitis. Per discharge summary, patient's mental status was impaired and thought to be due to sepsis. During this hospitalization, she had a TEE (05/30/19) and prosthetic valve endocarditis was ruled out. Patient had also completed a course of Augmentin for bilateral lower extremity ulcers/cellulitis on June 8th.    Past Medical History  Diastolic CHF Chronic anemia COPD Diabetes mellitus II  Gastric ulcer NSTEMI CKD IIIB Hepatitis C with cirrhosis Chronic leg venous ulcers  Significant Hospital Events   6/10 > Admitted to [place] 6/12 > Transferred to [facility] from [place] 6/13 > rapid response called at [facility] due to worsening lethargy. Placed on BiPAP   Consults:  Palliative care   Procedures:  6/17 > R leg above knee amputation  Significant Diagnostic Tests:   CXR 6/10 > increasing CHF with R basilar atelectasis.   MRI L tibia/fibula 6/10 > nonspecific subcutaneous edema throughout RLL. No evidence of osteomyelitis. Mild diffuse intramuscular edema, myositis vs. Denervation changes  MRI R foot 6/10 > no evidence of acute osteomyelitis. Ill-defined multilobulated fluid collections at dorsal proximal foot and along dorsal and medial foot concerning for developing abscesses. Diffuse edema suggesting myositis. Mild extensor digitorum longus tenosynovitis  MRI L foot 6/10 > significantly limited exam, pt unable to complete full exam. Interval amputation of L great toe at first MTP joint. Findings c/w reactive osteitis. Early acute osteomyelitis thought to  be unlikely. Nonspecific myositis.  MRI L tibia/fibula 6/11 > negative for osteomyelitis or myositis. Diffuse subcutaneous edema, possible cellulitis. No  subcutaneous abscess though limited exam.  MRI Head 6/13 > no acute intracranial abnormality, chronic ischemic microangiopathy   Micro Data:  Ucx 6/10 > neg Blood cx 6/10 > neg COVID 6/10 > neg MRSA PCR 6/17 > positive  Antimicrobials:  Cefepime 6/10  > Vancomycin 6/10 >  Interim history/subjective:  Admit to ICU following right AKA for necrotic foot.  Poor mental status at baseline.  Nonambulatory.  Objective   Blood pressure (!) 116/50, pulse 94, temperature 97.7 F (36.5 C), temperature source Axillary, resp. rate (!) 6, height 5' 5"  (1.651 m), weight 108.6 kg, SpO2 100 %.    Vent Mode: PRVC FiO2 (%):  [0.4 %-40 %] 40 % Set Rate:  [16 bmp] 16 bmp Vt Set:  [450 mL] 450 mL PEEP:  [5 cmH20] 5 cmH20 Plateau Pressure:  [21 cmH20-23 cmH20] 23 cmH20   Intake/Output Summary (Last 24 hours) at 07/05/2019 1052 Last data filed at 07/05/2019 1610 Gross per 24 hour  Intake 4283.15 ml  Output 2250 ml  Net 2033.15 ml   Filed Weights   07/02/19 0111 07/03/19 0027 07/04/19 0000  Weight: 108.4 kg 110 kg 108.6 kg    Examination: General: elderly female, ill-appearing, lying comfortably in PACU bed, intubated  HENT: normocephalic, atraumatic Lungs: CTAB, normal respiratory effort, no wheezes or crackles Cardiovascular: normal S1, S2, RRR, no JVD, no m/r/g Abdomen: nondistended, soft, nontender, rounded, active BS Extremities: chronic venous stasis of LLE, amputated L big toe, RLE bandaged at site of AKA and bleeding through dressing   Neuro: awakens and opens eyes, not oriented, not responsive to commands, moving head side to side spontaneously and blinking  Resolved Hospital Problem list     Assessment & Plan:   Critically ill due to acute hypoxic respiratory failure requiring mechanical ventilation Status post right AKA for chronic foot ischemia. Acute blood loss anemia from surgery. Toxic metabolic encephalopathy Hepatic cirrhosis Type 2 diabetes Chronic debility Sleep  apnea  Plan:  SBT with plan to extubate. Mental status poor at baseline.  May not be able to follow commands, but can be extubated if sufficient airway protective reflexes. Transfuse to keep hemoglobin greater than 7.5. Check ammonia and resume lactulose if necessary.   Daily Goals Checklist  Pain/Anxiety/Delirium protocol (if indicated): keep off all sedation if possible VAP protocol (if indicated): bundle in place.  Respiratory support goals: SBT - mental status likely will drive extubation.  Blood pressure target: MAP >65 currently on no support. DVT prophylaxis: UFH Nutrition Status: Initiate tube feeds via Cortrak - moderate nutritional risk GI prophylaxis: Protonix Fluid status goals: volume overloaded. Will diurese Urinary catheter: Assessment of intravascular volume Central lines: PIV Glucose control: T2 DM currently euglycemic on SSI Mobility/therapy needs: bedbound at baseline.  Antibiotic de-escalation: Complete perioperative antibiotics Home medication reconciliation: On hold. Daily labs: CBC, BMP Code Status: Full  Family Communication: Husband updated by surgery. Poor understanding of severity of illness.  Disposition: ICU   Labs   CBC: Recent Labs  Lab 07/01/19 0736 07/02/19 0827 07/03/19 0448 07/04/19 1756 07/05/19 0759  WBC 6.6 9.0 8.0 12.4* 8.3  HGB 8.6* 9.1* 8.3* 5.5* 6.7*  HCT 32.8* 34.6* 31.4* 20.6* 23.4*  MCV 85.0 84.4 85.6 84.4 81.0  PLT 153 140* 136* 133* 125*    Basic Metabolic Panel: Recent Labs  Lab 06/30/19 0721 06/30/19 0721 07/01/19 0736 07/02/19 0827 07/03/19 0448  07/04/19 1636 07/05/19 0759  NA 143   < > 144 145 143 146* 146*  K 4.3   < > 4.4 4.5 4.2 4.7 4.7  CL 114*   < > 115* 116* 112* 117* 116*  CO2 22   < > 20* 22 21* 21* 22  GLUCOSE 126*   < > 90 119* 116* 124* 138*  BUN 40*   < > 39* 37* 36* 34* 37*  CREATININE 2.33*   < > 2.09* 2.00* 2.00* 1.92* 2.10*  CALCIUM 8.1*   < > 8.2* 8.5* 8.4* 8.1* 8.1*  MG 1.9  --   --    --   --   --   --    < > = values in this interval not displayed.   GFR: Estimated Creatinine Clearance: 33.2 mL/min (A) (by C-G formula based on SCr of 2.1 mg/dL (H)). Recent Labs  Lab 07/02/19 0827 07/03/19 0448 07/04/19 1756 07/05/19 0759  WBC 9.0 8.0 12.4* 8.3    Liver Function Tests: Recent Labs  Lab 06/30/19 0721 07/04/19 1636 07/05/19 0759  AST 19 14* 16  ALT 12 8 10   ALKPHOS 168* 109 101  BILITOT 0.9 0.8 1.7*  PROT 8.4* 6.8 7.0  ALBUMIN 1.8* 1.6* 1.7*   No results for input(s): LIPASE, AMYLASE in the last 168 hours. Recent Labs  Lab 06/29/19 1425 07/04/19 1636  AMMONIA 40* 21    ABG    Component Value Date/Time   PHART 7.388 07/04/2019 1400   PCO2ART 34.5 07/04/2019 1400   PO2ART 107 07/04/2019 1400   HCO3 20.4 07/04/2019 1400   TCO2 28 09/29/2006 1033   ACIDBASEDEF 3.8 (H) 07/04/2019 1400   O2SAT 98.3 07/04/2019 1400     Coagulation Profile: Recent Labs  Lab 07/01/19 0736  INR 1.5*    Cardiac Enzymes: No results for input(s): CKTOTAL, CKMB, CKMBINDEX, TROPONINI in the last 168 hours.  HbA1C: Hgb A1c MFr Bld  Date/Time Value Ref Range Status  05/27/2019 05:30 AM 7.4 (H) 4.8 - 5.6 % Final    Comment:    (NOTE) Pre diabetes:          5.7%-6.4% Diabetes:              >6.4% Glycemic control for   <7.0% adults with diabetes   05/08/2019 05:08 AM 6.8 (H) 4.8 - 5.6 % Final    Comment:    (NOTE) Pre diabetes:          5.7%-6.4% Diabetes:              >6.4% Glycemic control for   <7.0% adults with diabetes     CBG: Recent Labs  Lab 07/04/19 1603 07/04/19 1921 07/04/19 2317 07/05/19 0319 07/05/19 0742  GLUCAP 116* 114* 127* 119* 130*    CRITICAL CARE Performed by: Kipp Brood   Total critical care time: 45 minutes  Critical care time was exclusive of separately billable procedures and treating other patients.  Critical care was necessary to treat or prevent imminent or life-threatening deterioration.  Critical care was  time spent personally by me on the following activities: development of treatment plan with patient and/or surrogate as well as nursing, discussions with consultants, evaluation of patient's response to treatment, examination of patient, obtaining history from patient or surrogate, ordering and performing treatments and interventions, ordering and review of laboratory studies, ordering and review of radiographic studies, pulse oximetry, re-evaluation of patient's condition and participation in multidisciplinary rounds.  Kipp Brood, MD [facility] ICU Physician  [place]  Pager: [phone] Mobile: [phone] After hours: [phone].

## 2019-07-06 LAB — BPAM RBC
Blood Product Expiration Date: 202106242359
Blood Product Expiration Date: 202106242359
Blood Product Expiration Date: 202107152359
Blood Product Expiration Date: 202107152359
ISSUE DATE / TIME: 202106172123
ISSUE DATE / TIME: 202106180011
ISSUE DATE / TIME: 202106181245
ISSUE DATE / TIME: 202106181716
Unit Type and Rh: 5100
Unit Type and Rh: 5100
Unit Type and Rh: 5100
Unit Type and Rh: 5100

## 2019-07-06 LAB — GLUCOSE, CAPILLARY
Glucose-Capillary: 105 mg/dL — ABNORMAL HIGH (ref 70–99)
Glucose-Capillary: 114 mg/dL — ABNORMAL HIGH (ref 70–99)
Glucose-Capillary: 123 mg/dL — ABNORMAL HIGH (ref 70–99)
Glucose-Capillary: 135 mg/dL — ABNORMAL HIGH (ref 70–99)
Glucose-Capillary: 164 mg/dL — ABNORMAL HIGH (ref 70–99)
Glucose-Capillary: 90 mg/dL (ref 70–99)

## 2019-07-06 LAB — BASIC METABOLIC PANEL
Anion gap: 9 (ref 5–15)
BUN: 42 mg/dL — ABNORMAL HIGH (ref 8–23)
CO2: 22 mmol/L (ref 22–32)
Calcium: 8.2 mg/dL — ABNORMAL LOW (ref 8.9–10.3)
Chloride: 115 mmol/L — ABNORMAL HIGH (ref 98–111)
Creatinine, Ser: 2.29 mg/dL — ABNORMAL HIGH (ref 0.44–1.00)
GFR calc Af Amer: 25 mL/min — ABNORMAL LOW (ref >60)
GFR calc non Af Amer: 22 mL/min — ABNORMAL LOW (ref >60)
Glucose, Bld: 160 mg/dL — ABNORMAL HIGH (ref 70–99)
Potassium: 4.1 mmol/L (ref 3.5–5.1)
Sodium: 146 mmol/L — ABNORMAL HIGH (ref 135–145)

## 2019-07-06 LAB — CBC
HCT: 27.5 % — ABNORMAL LOW (ref 36.0–46.0)
Hemoglobin: 8.3 g/dL — ABNORMAL LOW (ref 12.0–15.0)
MCH: 24.7 pg — ABNORMAL LOW (ref 26.0–34.0)
MCHC: 30.2 g/dL (ref 30.0–36.0)
MCV: 81.8 fL (ref 80.0–100.0)
Platelets: 108 10*3/uL — ABNORMAL LOW (ref 150–400)
RBC: 3.36 MIL/uL — ABNORMAL LOW (ref 3.87–5.11)
RDW: 24.3 % — ABNORMAL HIGH (ref 11.5–15.5)
WBC: 11.5 10*3/uL — ABNORMAL HIGH (ref 4.0–10.5)
nRBC: 0.2 % (ref 0.0–0.2)

## 2019-07-06 LAB — TYPE AND SCREEN
ABO/RH(D): O POS
Antibody Screen: NEGATIVE
Unit division: 0
Unit division: 0
Unit division: 0
Unit division: 0

## 2019-07-06 MED ORDER — FUROSEMIDE 10 MG/ML IJ SOLN
40.0000 mg | Freq: Once | INTRAMUSCULAR | Status: AC
  Administered 2019-07-06: 40 mg via INTRAVENOUS
  Filled 2019-07-06: qty 4

## 2019-07-06 NOTE — Progress Notes (Signed)
OT Cancellation Note  Patient Details Name: [name] MRN: [id number] DOB: Jun 01, 1955   Cancelled Treatment:    Reason Eval/Treat Not Completed: Patient not medically ready (Remains intubated)  [name] 07/06/2019, 9:45 AM  Nestor Lewandowsky, OTR/L Acute Rehabilitation Services Pager: [phone] Office: [phone]

## 2019-07-06 NOTE — Progress Notes (Signed)
PT Cancellation Note  Patient Details Name: [name] MRN: [id number] DOB: [birth year]   Cancelled Treatment:    Reason Eval/Treat Not Completed: Other (comment). Per RN, plan to extubate this morning. Will check back this afternoon for PT Evaluation as schedule permits.  [name], PT, DPT Acute Rehabilitation Services  Pager [phone] Office [place] 07/06/2019, 10:25 AM

## 2019-07-06 NOTE — Progress Notes (Addendum)
NAME:  [name], MRN:  [id number], DOB:  April 22, 1955, LOS: 9 ADMISSION DATE:  06/27/2019, CONSULTATION DATE:  07/04/19 REFERRING MD:  Dr. Carlis Abbott, CHIEF COMPLAINT:  Post-op respiratory failure  Brief History   [name] is a 64yo f with a PMHx of diastolic CHF with anasarca, NSTEMI, COPD, DMII uncontrolled, anemia, CKD IIIB, hepatitis C with cirrhosis who presented for altered mental status. 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 She was unable to be extubated following surgery. CCM was consulted for ventilator management.  History of present illness   [name] is a 63yo f with a PMHx of diastolic CHF with anasarca, NSTEMI, COPD, DMII uncontrolled, anemia, CKD IIIB, hepatitis C with cirrhosis who presented to [facility] from [facility] facility for altered mental status. Per chart review, patient's nurse at the nursing facility had noted that she had been moaning and groaning without coherent conversation, though at baseline she is oriented and able to converse normally. Pt was also noted to be hypoxic to 58% while at nursing facility. She does not normally wear O2 at home. While at [facility], pt also developed desaturations and was placed and maintained on BiPAP with improvement in hypoxia. She developed worsening lethargy and unresponsiveness on 6/14 that required her to be placed on BiPAP again. Pt also underwent MRI that showed R foot abscess/myositis/tenosynovitis as well as L foot myositis and leg cellulitis. Not thought to be osteomyelitis. Vascular surgery was consulted and recommended right above knee amputation and left angiography. Patient's husband had refused amputation and consented to have R AKA on 6/17. Patient was transferred to [facility] for the procedure. She underwent AKA on 2/29 without complication. Extubation was attempted in the OR including with pressure support  but patient was unable to generate adequate volumes. She also was unable to be weaned while at PACU.  Notably, patient was admitted at [facility] between 5/9-5/20/21 for Group c strep bacteremia and bilateral leg edema, as well as R leg superficial wounds and cellulitis. Per discharge summary, patient's mental status was impaired and thought to be due to sepsis. During this hospitalization, she had a TEE (05/30/19) and prosthetic valve endocarditis was ruled out. Patient had also completed a course of Augmentin for bilateral lower extremity ulcers/cellulitis on June 8th.    Past Medical History  Diastolic CHF Chronic anemia COPD Diabetes mellitus II  Gastric ulcer NSTEMI CKD IIIB Hepatitis C with cirrhosis Chronic leg venous ulcers  Significant Hospital Events   6/10 > Admitted to [place] 6/12 > Transferred to [facility] from [place] 6/13 > rapid response called at [facility] due to worsening lethargy. Placed on BiPAP   Consults:  Palliative care   Procedures:  6/17 > R leg above knee amputation  Significant Diagnostic Tests:   CXR 6/10 > increasing CHF with R basilar atelectasis.   MRI L tibia/fibula 6/10 > nonspecific subcutaneous edema throughout RLL. No evidence of osteomyelitis. Mild diffuse intramuscular edema, myositis vs. Denervation changes  MRI R foot 6/10 > no evidence of acute osteomyelitis. Ill-defined multilobulated fluid collections at dorsal proximal foot and along dorsal and medial foot concerning for developing abscesses. Diffuse edema suggesting myositis. Mild extensor digitorum longus tenosynovitis  MRI L foot 6/10 > significantly limited exam, pt unable to complete full exam. Interval amputation of L great toe at first MTP joint. Findings c/w reactive osteitis. Early acute osteomyelitis thought to  be unlikely. Nonspecific myositis.  MRI L tibia/fibula 6/11 > negative for osteomyelitis or myositis. Diffuse subcutaneous edema, possible cellulitis. No  subcutaneous abscess though limited exam.  MRI Head 6/13 > no acute intracranial abnormality, chronic ischemic microangiopathy   Micro Data:  Ucx 6/10 > neg Blood cx 6/10 > neg COVID 6/10 > neg MRSA PCR 6/17 > positive  Antimicrobials:  Cefepime 6/10  > Vancomycin 6/10 >  Interim history/subjective:   More awake today.  Following commands.  Episode of emesis last night.  Tube feeds on hold.  Objective   Blood pressure 139/71, pulse 90, temperature 98 F (36.7 C), temperature source Axillary, resp. rate 20, height 5' 5"  (1.651 m), weight 108.6 kg, SpO2 99 %.    Vent Mode: CPAP;PSV FiO2 (%):  [40 %] 40 % Set Rate:  [16 bmp] 16 bmp Vt Set:  [450 mL] 450 mL PEEP:  [5 cmH20] 5 cmH20 Pressure Support:  [10 cmH20] 10 cmH20 Plateau Pressure:  [22 cmH20-25 cmH20] 25 cmH20   Intake/Output Summary (Last 24 hours) at 07/06/2019 0859 Last data filed at 07/06/2019 0500 Gross per 24 hour  Intake 415 ml  Output 1900 ml  Net -1485 ml   Filed Weights   07/02/19 0111 07/03/19 0027 07/04/19 0000  Weight: 108.4 kg 110 kg 108.6 kg    Examination: General: elderly female, ill-appearing, lying comfortably in PACU bed, intubated  HENT: normocephalic, atraumatic Lungs: CTAB, normal respiratory effort, no wheezes or crackles.  Tolerating PSV 10/5 Cardiovascular: normal S1, S2, RRR, no JVD, no m/r/g Abdomen: nondistended, soft, nontender, rounded, active BS Extremities: chronic venous stasis of LLE, amputated L big toe, RLE bandaged at site of AKA wound site intact. Neuro: awakens and opens eyes, orients to voice and follows commands.  Yes I do have another 1  Resolved Hospital Problem list     Assessment & Plan:   Critically ill due to acute hypoxic respiratory failure requiring mechanical ventilation Status post right AKA for chronic foot ischemia. Acute blood loss anemia from surgery. Toxic metabolic encephalopathy Hepatic cirrhosis Type 2 diabetes Chronic debility Sleep  apnea  Plan:  SBT with plan to extubate today as more awake. Mental status poor at baseline.  May not be able to follow commands, but can be extubated if sufficient airway protective reflexes. Transfuse to keep hemoglobin greater than 7.5.  Good response to transfusion yesterday. Hold tube feeds for now.  Once extubated may challenge with clear fluids.  Patient may have been gagging on tube.   Daily Goals Checklist  Pain/Anxiety/Delirium protocol (if indicated): keep off all sedation if possible VAP protocol (if indicated): bundle in place.  Respiratory support goals: SBT -likely extubation today. Blood pressure target: MAP >65 currently on no support. DVT prophylaxis: UFH Nutrition Status: Initiate tube feeds via Cortrak - moderate nutritional risk GI prophylaxis: Protonix Fluid status goals: volume overloaded. Will diurese again today Urinary catheter: Assessment of intravascular volume Central lines: PIV Glucose control: T2 DM currently euglycemic on SSI Mobility/therapy needs: bedbound at baseline.  Antibiotic de-escalation: Complete perioperative antibiotics Home medication reconciliation: On hold. Daily labs: CBC, BMP Code Status: Full  Family Communication: Husband updated by surgery. Poor understanding of severity of illness.  Disposition: ICU   Labs   CBC: Recent Labs  Lab 07/02/19 0827 07/03/19 0448 07/04/19 1756 07/05/19 0759 07/06/19 0600  WBC 9.0 8.0 12.4* 8.3 11.5*  HGB 9.1* 8.3* 5.5* 6.7* 8.3*  HCT 34.6* 31.4* 20.6* 23.4* 27.5*  MCV 84.4 85.6 84.4 81.0 81.8  PLT 140* 136* 133* 125* 108*    Basic Metabolic Panel: Recent Labs  Lab 06/30/19 0721 07/01/19 0736 07/02/19 0827 07/03/19 0448 07/04/19 1636 07/05/19 0759 07/06/19 0600  NA 143   < > 145 143 146* 146* 146*  K 4.3   < > 4.5 4.2 4.7 4.7 4.1  CL 114*   < > 116* 112* 117* 116* 115*  CO2 22   < > 22 21* 21* 22 22  GLUCOSE 126*   < > 119* 116* 124* 138* 160*  BUN 40*   < > 37* 36* 34* 37*  42*  CREATININE 2.33*   < > 2.00* 2.00* 1.92* 2.10* 2.29*  CALCIUM 8.1*   < > 8.5* 8.4* 8.1* 8.1* 8.2*  MG 1.9  --   --   --   --   --   --    < > = values in this interval not displayed.   GFR: Estimated Creatinine Clearance: 30.4 mL/min (A) (by C-G formula based on SCr of 2.29 mg/dL (H)). Recent Labs  Lab 07/03/19 0448 07/04/19 1756 07/05/19 0759 07/06/19 0600  WBC 8.0 12.4* 8.3 11.5*    Liver Function Tests: Recent Labs  Lab 06/30/19 0721 07/04/19 1636 07/05/19 0759  AST 19 14* 16  ALT 12 8 10   ALKPHOS 168* 109 101  BILITOT 0.9 0.8 1.7*  PROT 8.4* 6.8 7.0  ALBUMIN 1.8* 1.6* 1.7*   No results for input(s): LIPASE, AMYLASE in the last 168 hours. Recent Labs  Lab 06/29/19 1425 07/04/19 1636 07/05/19 1213  AMMONIA 40* 21 20    ABG    Component Value Date/Time   PHART 7.388 07/04/2019 1400   PCO2ART 34.5 07/04/2019 1400   PO2ART 107 07/04/2019 1400   HCO3 20.4 07/04/2019 1400   TCO2 28 09/29/2006 1033   ACIDBASEDEF 3.8 (H) 07/04/2019 1400   O2SAT 98.3 07/04/2019 1400     Coagulation Profile: Recent Labs  Lab 07/01/19 0736  INR 1.5*    Cardiac Enzymes: No results for input(s): CKTOTAL, CKMB, CKMBINDEX, TROPONINI in the last 168 hours.  HbA1C: Hgb A1c MFr Bld  Date/Time Value Ref Range Status  05/27/2019 05:30 AM 7.4 (H) 4.8 - 5.6 % Final    Comment:    (NOTE) Pre diabetes:          5.7%-6.4% Diabetes:              >6.4% Glycemic control for   <7.0% adults with diabetes   05/08/2019 05:08 AM 6.8 (H) 4.8 - 5.6 % Final    Comment:    (NOTE) Pre diabetes:          5.7%-6.4% Diabetes:              >6.4% Glycemic control for   <7.0% adults with diabetes     CBG: Recent Labs  Lab 07/05/19 1532 07/05/19 2031 07/05/19 2346 07/06/19 0331 07/06/19 0725  GLUCAP 95 139* 161* 164* 135*    CRITICAL CARE Performed by: Kipp Brood   Total critical care time: 40 minutes  Critical care time was exclusive of separately billable procedures  and treating other patients.  Critical care was necessary to treat or prevent imminent or life-threatening deterioration.  Critical care was time spent personally by me on the following activities: development of treatment plan with patient and/or surrogate as well as nursing, discussions with consultants, evaluation of patient's response to treatment, examination of patient, obtaining history from patient or surrogate, ordering and performing treatments and interventions, ordering  and review of laboratory studies, ordering and review of radiographic studies, pulse oximetry, re-evaluation of patient's condition and participation in multidisciplinary rounds.  Kipp Brood, MD [facility] ICU Physician [place]  Pager: [phone] Mobile: [phone] After hours: [phone].

## 2019-07-06 NOTE — Progress Notes (Signed)
Palliative Medicine Inpatient Follow Up Note   HPI: 64 y.o. female  with past medical history of chronic systolic heart failure, COPD, DM2, DOE, OSA, Hep C, other cirrhosis, CKD III, lower extremity cellulitis, lower extremity venous stasis ulcers,  admitted on 06/27/2019 with complaints of altered mental status. Workup reveals metabolic encephalopathy in the setting of infectious process related to lower extremity cellulitis, myositis, osteoitis. Vascular surgery consulted and recommend BKA. Spouse initially has not consented for surgery. Palliative consulted for [name].    Patient since that time underwent a AKA and since has not been able to tolerate ventilatory weaning. We were asked to speak to patients husband about goals of care moving forward being that she will likely get extubated though there will most probably be another event causing hospitalization given her comorbid conditions.  Today's Discussion (07/05/2019): Chart reviewed patient evaluated at bedside. Noted to be intubated. Was able to touch base with bedside RN, [name] who shares patient is not very responsive to her.   I called patients husband, [name] this evening. I introduced Palliative Medicine as specialized medical care for people living with serious illness. It focuses on providing relief from the symptoms and stress of a serious illness. The goal is to improve quality of life for both the patient and the family.  I asked [name] to share with me how [name] had been doing prior to hospitalization. He stated that he felt she was "doing good". He believed that she would soon return home to living with him as she had been prior. She had been at [facility] prior to hospitalization. [name] expressed anger that they were, "not giving her the antibiotics as ordered." He shared that he felt discriminated against based upon her insurance coverage and lack of medicare. I kindly redirected the conversation to the present situation.  [name] continued that become tearful stating, "she's going to get of the vent and do well."   I asked [name] to describe to me what he understood about [name]'s health ailments. He sated that, "they cut off the leg and that removed the infection." I asked him what he understood about her other conditions. He said that he is aware of "the liver". I was honest with [name] about the reality that [name] has a multitude of chronic conditions. We discussed how all of these work together essentially and can eventually result in organ dysfunction. He became quite upset saying, "no one told me this." He shared that "I thought cutting off the leg was the answer." Allowed time for [name] to express his emotions. I asked him if he wanted me to be honest with him and he said yes, "don't sugar coat it." I shared that with heart failure, COPD, cirrhosis, and CKD that there are many things working against [name]'s liklihood for a true and fruitful recovery. We discussed best case scenario versus worst case scenario. I shared the hope for best case scenario but also elaborated that this too would carry with it a variety of obstacles. We discussed worst case scenario which would be a case where focus was shifted from curative to symptom relieving.   [name] was shared that he understands but does wish to give [name] another few days to see if she will turn around. I shared that [name] does feel optimistic about her getting off of the ventilator. I stated that with that we still need to think of the big picture and how [name]'s life may look like moving forward. I again emphasized  that with improvement she will still have many things to contend with inclusive of the aforementioned chronic diseases and physical debility. [name] shares that [name] would never want prolongation of life sustaining measures though he believes she would want [place] "to try".   [name] and I discussed meeting in person on Sunday. I am hopeful that at  this time he may reconsider her code status at the very least and allow continuation of these candid discussions. He requested that we are "blunt and honest" he would prefer if the healthcare team, "keep it real" with him so that he can make the best decisions moving forward.  Discussed the importance of continued conversation with family and their  medical providers regarding overall plan of care and treatment options, ensuring decisions are within the context of the patients values and GOCs.  Questions and concerns addressed   Vital Signs Vitals:   07/06/19 0500 07/06/19 0507  BP: 131/65   Pulse: 86 89  Resp: (!) 1 14  Temp:    SpO2: 100% 100%    Intake/Output Summary (Last 24 hours) at 07/06/2019 0646 Last data filed at 07/06/2019 0500 Gross per 24 hour  Intake 2365 ml  Output 1900 ml  Net 465 ml   Last Weight  Most recent update: 07/04/2019 12:18 AM   Weight  108.6 kg (239 lb 6.7 oz)           Gen:  AA F in NAD  HEENT: dry mucous membranes, coretrack in place CV: Regular rate and rhythm  PULM: On ventilator, difficult to hear over vent ABD: soft/nontender  Neuro: Somnolent  SUMMARY [name] states he would wish for a times trial of all treatment for seven days - Plan to continue [name] conversations with emphasis on the whole clinical picture - Will meet with [name] on Sunday afternoon, he has transportation problems so we were unable to solidify a time -I plan to request outpatient palliative follow up for [name] depending on how well she improves  Time In: 1800 Time Out: 1845 Time Spent: 45 minutes Greater than 50% of the time was spent in counseling and coordination of care ______________________________________________________________________________________ [name] Team Team Cell Phone: [phone] Please utilize secure chat with additional questions, if there is no response within 30 minutes please  call the above phone number  Palliative Medicine Team providers are available by phone from 7am to 7pm daily and can be reached through the team cell phone.  Should this patient require assistance outside of these hours, please call the patient's attending physician.

## 2019-07-06 NOTE — Progress Notes (Signed)
  Progress Note    07/06/2019 10:50 AM 2 Days Post-Op  Subjective: Remains intubated  Vitals:   07/06/19 0800 07/06/19 0900  BP: 127/68 138/74  Pulse: 84 88  Resp: 16 18  Temp:    SpO2: 100% 100%    Physical Exam: Intubated, does awaken and respond to voice  CBC    Component Value Date/Time   WBC 11.5 (H) 07/06/2019 0600   RBC 3.36 (L) 07/06/2019 0600   HGB 8.3 (L) 07/06/2019 0600   HGB 7.3 (L) 11/23/2017 1454   HCT 27.5 (L) 07/06/2019 0600   HCT 45.7 08/03/2013 2138   PLT 108 (L) 07/06/2019 0600   PLT 118 (L) 08/03/2013 2138   MCV 81.8 07/06/2019 0600   MCV 89 08/03/2013 2138   MCH 24.7 (L) 07/06/2019 0600   MCHC 30.2 07/06/2019 0600   RDW 24.3 (H) 07/06/2019 0600   RDW 13.6 08/03/2013 2138   LYMPHSABS 0.7 06/28/2019 0446   LYMPHSABS 3.4 08/03/2013 2138   MONOABS 1.2 (H) 06/28/2019 0446   MONOABS 0.7 08/03/2013 2138   EOSABS 0.2 06/28/2019 0446   EOSABS 0.3 08/03/2013 2138   BASOSABS 0.1 06/28/2019 0446   BASOSABS 0.1 08/03/2013 2138    BMET    Component Value Date/Time   NA 146 (H) 07/06/2019 0600   NA 139 08/03/2013 2138   K 4.1 07/06/2019 0600   K 3.4 (L) 08/03/2013 2138   CL 115 (H) 07/06/2019 0600   CL 104 08/03/2013 2138   CO2 22 07/06/2019 0600   CO2 27 08/03/2013 2138   GLUCOSE 160 (H) 07/06/2019 0600   GLUCOSE 153 (H) 08/03/2013 2138   BUN 42 (H) 07/06/2019 0600   BUN 16 08/03/2013 2138   CREATININE 2.29 (H) 07/06/2019 0600   CREATININE 1.19 08/03/2013 2138   CALCIUM 8.2 (L) 07/06/2019 0600   CALCIUM 9.2 08/03/2013 2138   GFRNONAA 22 (L) 07/06/2019 0600   GFRNONAA 50 (L) 08/03/2013 2138   GFRAA 25 (L) 07/06/2019 0600   GFRAA 58 (L) 08/03/2013 2138    INR    Component Value Date/Time   INR 1.5 (H) 07/01/2019 0736   INR 0.9 03/12/2012 1104     Intake/Output Summary (Last 24 hours) at 07/06/2019 1050 Last data filed at 07/06/2019 0500 Gross per 24 hour  Intake 415 ml  Output 1600 ml  Net -1185 ml     Assessment/plan:  64  y.o. female is s/p right above-knee amputation.  Dressing removed today incisions clean dry intact does not appear to be any ongoing blood loss.  Will likely need to evaluate left lower extremity for revascularization during this hospitalization.     Layton Tappan C. Donzetta Matters, MD [facility] [place] Office: [phone] Pager: [phone]  07/06/2019 10:50 AM

## 2019-07-06 NOTE — Progress Notes (Signed)
Pt vomited TF and bile; TF stopped

## 2019-07-07 ENCOUNTER — Inpatient Hospital Stay (HOSPITAL_COMMUNITY): Payer: Medicaid Other

## 2019-07-07 LAB — GLUCOSE, CAPILLARY
Glucose-Capillary: 104 mg/dL — ABNORMAL HIGH (ref 70–99)
Glucose-Capillary: 81 mg/dL (ref 70–99)
Glucose-Capillary: 83 mg/dL (ref 70–99)
Glucose-Capillary: 84 mg/dL (ref 70–99)
Glucose-Capillary: 88 mg/dL (ref 70–99)
Glucose-Capillary: 90 mg/dL (ref 70–99)

## 2019-07-07 MED ORDER — ORAL CARE MOUTH RINSE
15.0000 mL | OROMUCOSAL | Status: DC
  Administered 2019-07-07 – 2019-07-10 (×29): 15 mL via OROMUCOSAL

## 2019-07-07 MED ORDER — CHLORHEXIDINE GLUCONATE 0.12% ORAL RINSE (MEDLINE KIT)
15.0000 mL | Freq: Two times a day (BID) | OROMUCOSAL | Status: DC
  Administered 2019-07-07 – 2019-07-09 (×6): 15 mL via OROMUCOSAL

## 2019-07-07 NOTE — Progress Notes (Signed)
RT attempted wean but pt had low VT even with increased PS up to 14.  RT will continue to monitor.

## 2019-07-07 NOTE — Progress Notes (Signed)
NAME:  [name], MRN:  [id number], DOB:  1955/09/15, LOS: 65 ADMISSION DATE:  06/27/2019, CONSULTATION DATE:  07/04/19 REFERRING MD:  Dr. Carlis Abbott, CHIEF COMPLAINT:  Post-op respiratory failure  Brief History   [name] is a 64yo f with a PMHx of diastolic CHF with anasarca, NSTEMI, COPD, DMII uncontrolled, anemia, CKD IIIB, hepatitis C with cirrhosis who presented for altered mental status. 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 She was unable to be extubated following surgery. CCM was consulted for ventilator management.  History of present illness   [name] is a 64yo f with a PMHx of diastolic CHF with anasarca, NSTEMI, COPD, DMII uncontrolled, anemia, CKD IIIB, hepatitis C with cirrhosis who presented to [facility] ED from [place] facility for altered mental status. Per chart review, patient's nurse at the nursing facility had noted that she had been moaning and groaning without coherent conversation, though at baseline she is oriented and able to converse normally. Pt was also noted to be hypoxic to 58% while at nursing facility. She does not normally wear O2 at home. While at [facility], pt also developed desaturations and was placed and maintained on BiPAP with improvement in hypoxia. She developed worsening lethargy and unresponsiveness on 6/14 that required her to be placed on BiPAP again. Pt also underwent MRI that showed R foot abscess/myositis/tenosynovitis as well as L foot myositis and leg cellulitis. Not thought to be osteomyelitis. Vascular surgery was consulted and recommended right above knee amputation and left angiography. Patient's husband had refused amputation and consented to have R AKA on 6/17. Patient was transferred to [facility] for the procedure. She underwent AKA on 0/53 without complication. Extubation was attempted in the OR including with pressure support  but patient was unable to generate adequate volumes. She also was unable to be weaned while at PACU.  Notably, patient was admitted at [facility] between 5/9-5/20/21 for Group c strep bacteremia and bilateral leg edema, as well as R leg superficial wounds and cellulitis. Per discharge summary, patient's mental status was impaired and thought to be due to sepsis. During this hospitalization, she had a TEE (05/30/19) and prosthetic valve endocarditis was ruled out. Patient had also completed a course of Augmentin for bilateral lower extremity ulcers/cellulitis on June 8th.    Past Medical History  Diastolic CHF Chronic anemia COPD Diabetes mellitus II  Gastric ulcer NSTEMI CKD IIIB Hepatitis C with cirrhosis Chronic leg venous ulcers  Significant Hospital Events   6/10 > Admitted to [facility] 6/12 > Transferred to [facility] from [place] 6/13 > rapid response called at [facility] due to worsening lethargy. Placed on BiPAP   Consults:  Palliative care   Procedures:  6/17 > R leg above knee amputation  Significant Diagnostic Tests:   CXR 6/10 > increasing CHF with R basilar atelectasis.   MRI L tibia/fibula 6/10 > nonspecific subcutaneous edema throughout RLL. No evidence of osteomyelitis. Mild diffuse intramuscular edema, myositis vs. Denervation changes  MRI R foot 6/10 > no evidence of acute osteomyelitis. Ill-defined multilobulated fluid collections at dorsal proximal foot and along dorsal and medial foot concerning for developing abscesses. Diffuse edema suggesting myositis. Mild extensor digitorum longus tenosynovitis  MRI L foot 6/10 > significantly limited exam, pt unable to complete full exam. Interval amputation of L great toe at first MTP joint. Findings c/w reactive osteitis. Early acute osteomyelitis thought to  be unlikely. Nonspecific myositis.  MRI L tibia/fibula 6/11 > negative for osteomyelitis or myositis. Diffuse subcutaneous edema, possible cellulitis. No  subcutaneous abscess though limited exam.  MRI Head 6/13 > no acute intracranial abnormality, chronic ischemic microangiopathy  CXR 6/20: bilateral effusions no significant air space disease.   Micro Data:  Ucx 6/10 > neg Blood cx 6/10 > neg COVID 6/10 > neg MRSA PCR 6/17 > positive  Antimicrobials:  Cefepime 6/10  > Vancomycin 6/10 >  Interim history/subjective:   Mental status remains unchanged.   Objective   Blood pressure 139/67, pulse 79, temperature (!) 97 F (36.1 C), temperature source Axillary, resp. rate 16, height 5' 5"  (1.651 m), weight 101.9 kg, SpO2 100 %.    Vent Mode: PRVC FiO2 (%):  [40 %] 40 % Set Rate:  [16 bmp] 16 bmp Vt Set:  [450 mL] 450 mL PEEP:  [5 cmH20] 5 cmH20 Pressure Support:  [12 cmH20] 12 cmH20 Plateau Pressure:  [20 cmH20-23 cmH20] 21 cmH20   Intake/Output Summary (Last 24 hours) at 07/07/2019 0950 Last data filed at 07/07/2019 0600 Gross per 24 hour  Intake 605.82 ml  Output 1825 ml  Net -1219.18 ml   Filed Weights   07/03/19 0027 07/04/19 0000 07/07/19 0500  Weight: 110 kg 108.6 kg 101.9 kg    Examination: General: elderly female, ill-appearing, lying comfortably in PACU bed, intubated  HENT: normocephalic, atraumatic Lungs: CTAB, normal respiratory effort, no wheezes or crackles. Tachypnea with rapid shallow breathing immediately on 5/5 Cardiovascular: normal S1, S2, RRR, no JVD, no m/r/g Abdomen: nondistended, soft, nontender, rounded, active BS Extremities: chronic venous stasis of LLE, amputated L big toe, RLE bandaged at site of AKA wound site intact. Neuro: awakens and opens eyes, orients to voice and follows commands.    Resolved Hospital Problem list     Assessment & Plan:   Critically ill due to acute hypoxic respiratory failure requiring mechanical ventilation Status post right AKA for chronic foot ischemia. Acute blood loss anemia from surgery. Toxic metabolic encephalopathy Hepatic cirrhosis Type 2  diabetes Chronic debility Sleep apnea  Plan:  Continue daily SBT but remains marginal for extubation due to poor mental status and rapid shallow breathing on PSV. Unsure how best to optimize her: on no sedation, on lactulose but with low ammonia. Reluctant to diurese further due to rising creatinine.  Will give her more time and allow Palliative care to speak with husband so that we may have some greater clarity on goals of care prior to deciding on trial of extubation +/- re-intubation, or possible tracheostomy.    Daily Goals Checklist  Pain/Anxiety/Delirium protocol (if indicated): keep off all sedation if possible VAP protocol (if indicated): bundle in place.  Respiratory support goals: SBT -likely extubation today. Blood pressure target: MAP >65 currently on no support. DVT prophylaxis: UFH Nutrition Status: Initiate tube feeds via Cortrak - moderate nutritional risk GI prophylaxis: Protonix Fluid status goals: edema but creatinine rising. Hold diuresis for today.  Urinary catheter: Assessment of intravascular volume Central lines: PIV Glucose control: T2 DM currently euglycemic on SSI Mobility/therapy needs: bedbound at baseline.  Antibiotic de-escalation: Complete perioperative antibiotics Home medication reconciliation: On hold. Daily labs: CBC, BMP Code Status: Full  Family Communication: Husband updated by surgery. Poor understanding of severity of illness.  Disposition: ICU   Labs   CBC: Recent Labs  Lab 07/02/19 0827 07/03/19 0448 07/04/19 1756 07/05/19 0759 07/06/19 0600  WBC 9.0 8.0 12.4* 8.3 11.5*  HGB 9.1* 8.3* 5.5*  6.7* 8.3*  HCT 34.6* 31.4* 20.6* 23.4* 27.5*  MCV 84.4 85.6 84.4 81.0 81.8  PLT 140* 136* 133* 125* 108*    Basic Metabolic Panel: Recent Labs  Lab 07/02/19 0827 07/03/19 0448 07/04/19 1636 07/05/19 0759 07/06/19 0600  NA 145 143 146* 146* 146*  K 4.5 4.2 4.7 4.7 4.1  CL 116* 112* 117* 116* 115*  CO2 22 21* 21* 22 22  GLUCOSE  119* 116* 124* 138* 160*  BUN 37* 36* 34* 37* 42*  CREATININE 2.00* 2.00* 1.92* 2.10* 2.29*  CALCIUM 8.5* 8.4* 8.1* 8.1* 8.2*   GFR: Estimated Creatinine Clearance: 29.4 mL/min (A) (by C-G formula based on SCr of 2.29 mg/dL (H)). Recent Labs  Lab 07/03/19 0448 07/04/19 1756 07/05/19 0759 07/06/19 0600  WBC 8.0 12.4* 8.3 11.5*    Liver Function Tests: Recent Labs  Lab 07/04/19 1636 07/05/19 0759  AST 14* 16  ALT 8 10  ALKPHOS 109 101  BILITOT 0.8 1.7*  PROT 6.8 7.0  ALBUMIN 1.6* 1.7*   No results for input(s): LIPASE, AMYLASE in the last 168 hours. Recent Labs  Lab 07/04/19 1636 07/05/19 1213  AMMONIA 21 20    ABG    Component Value Date/Time   PHART 7.388 07/04/2019 1400   PCO2ART 34.5 07/04/2019 1400   PO2ART 107 07/04/2019 1400   HCO3 20.4 07/04/2019 1400   TCO2 28 09/29/2006 1033   ACIDBASEDEF 3.8 (H) 07/04/2019 1400   O2SAT 98.3 07/04/2019 1400     Coagulation Profile: Recent Labs  Lab 07/01/19 0736  INR 1.5*    Cardiac Enzymes: No results for input(s): CKTOTAL, CKMB, CKMBINDEX, TROPONINI in the last 168 hours.  HbA1C: Hgb A1c MFr Bld  Date/Time Value Ref Range Status  05/27/2019 05:30 AM 7.4 (H) 4.8 - 5.6 % Final    Comment:    (NOTE) Pre diabetes:          5.7%-6.4% Diabetes:              >6.4% Glycemic control for   <7.0% adults with diabetes   05/08/2019 05:08 AM 6.8 (H) 4.8 - 5.6 % Final    Comment:    (NOTE) Pre diabetes:          5.7%-6.4% Diabetes:              >6.4% Glycemic control for   <7.0% adults with diabetes     CBG: Recent Labs  Lab 07/06/19 1116 07/06/19 1521 07/06/19 1953 07/06/19 2354 07/07/19 0350  GLUCAP 123* 114* 105* 90 84    CRITICAL CARE Performed by: Kipp Brood   Total critical care time: 40 minutes  Critical care time was exclusive of separately billable procedures and treating other patients.  Critical care was necessary to treat or prevent imminent or life-threatening  deterioration.  Critical care was time spent personally by me on the following activities: development of treatment plan with patient and/or surrogate as well as nursing, discussions with consultants, evaluation of patient's response to treatment, examination of patient, obtaining history from patient or surrogate, ordering and performing treatments and interventions, ordering and review of laboratory studies, ordering and review of radiographic studies, pulse oximetry, re-evaluation of patient's condition and participation in multidisciplinary rounds.  Kipp Brood, MD [facility] ICU Physician [place]  Pager: [phone] Mobile: [phone] After hours: [phone].

## 2019-07-07 NOTE — Progress Notes (Signed)
   Patient remains intubated and sedated.  Right AKA site healing well.  May need evaluation of left lower extremity while inpatient when she recovers from current status.  Sura Canul C. Donzetta Matters, MD [facility] [place] Office: [phone] Pager: [phone]

## 2019-07-07 NOTE — Progress Notes (Signed)
   Palliative Medicine Inpatient Follow Up Note   Palliative care reach out to patients husband, [name] this morning. He stated that he would be unable to come in to meet with our team today due to transportation problems. He shares that he will be here tomorrow afternoon. We plan to meet with him then to further address goals of care.   No Charge ______________________________________________________________________________________ [place] Team Team Cell Phone: [phone] Please utilize secure chat with additional questions, if there is no response within 30 minutes please call the above phone number  Palliative Medicine Team providers are available by phone from 7am to 7pm daily and can be reached through the team cell phone.  Should this patient require assistance outside of these hours, please call the patient's attending physician.

## 2019-07-07 NOTE — Evaluation (Signed)
Physical Therapy Evaluation Patient Details Name: [name] MRN: [id number] DOB: October 03, 1955 Today's Date: 07/07/2019   History of Present Illness  [name] is a 64yo f with a PMHx of diastolic CHF with anasarca, NSTEMI, COPD, DMII uncontrolled, anemia, CKD IIIB, hepatitis C with cirrhosis who presented for altered mental status. 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 She was unable to be extubated following surgery. CCM was consulted for ventilator management.  Clinical Impression  Pt admitted with/for AMS, s/p AKA and unable to extubate.  Pt needing total assist for basic mobility at bed level only as of evaluation.  Pt currently limited functionally due to the problems listed. ( See problems list.)   Pt will benefit from PT to maximize function and safety in order to get ready for next venue listed below.     Follow Up Recommendations SNF    Equipment Recommendations  Other (comment) (TBA)    Recommendations for Other Services       Precautions / Restrictions Precautions Precautions: Fall      Mobility  Bed Mobility Overal bed mobility: Needs Assistance Bed Mobility: Rolling;Supine to Sit;Sit to Supine Rolling: Total assist;+2 for physical assistance   Supine to sit: Total assist;+2 for physical assistance Sit to supine: Total assist;+2 for physical assistance   General bed mobility comments: no attempts to assist transitions noted.  Transfers                 General transfer comment: unable  Ambulation/Gait                Stairs            Wheelchair Mobility    Modified Rankin (Stroke Patients Only)       Balance Overall balance assessment: History of Falls;Needs assistance Sitting-balance support: Feet supported Sitting balance-Leahy Scale: Poor Sitting balance - Comments: maximal assist to get patient into midline from L biased lean, but once  there, pt relaxed and sat in midline with min guard, but no challenge.  Sat EOB x 5 min                                     Pertinent Vitals/Pain Pain Assessment: Faces Faces Pain Scale: No hurt Pain Intervention(s): Monitored during session    Home Living Family/patient expects to be discharged to:: Skilled nursing facility Living Arrangements: Spouse/significant other Available Help at Discharge: Family Type of Home: House Home Access: Stairs to enter;Ramped entrance Entrance Stairs-Rails: Right;Left;Can reach both Entrance Stairs-Number of Steps: 3 Home Layout: One level Home Equipment: Walker - 2 wheels;Wheelchair - [employer];Tub bench;Grab bars - tub/shower;Hospital bed Additional Comments: Per chart as of recent admission, lived with husband who is wheelchair-bound as well. used a walker to get from her hospital bed in the living room to her w/c and /or bathroom per patient report.    Prior Function Level of Independence: Needs assistance      ADL's / Homemaking Assistance Needed: Per chart from past admission, needed som ADL assist        Hand Dominance   Dominant Hand: Right    Extremity/Trunk Assessment   Upper Extremity Assessment Upper Extremity Assessment: Generalized weakness (spontaneous mvmt once more alert bil)    Lower Extremity Assessment Lower Extremity Assessment: Generalized weakness;RLE deficits/detail;LLE deficits/detail RLE Deficits / Details: no movement/resistance to  MMT/commands RLE Coordination: decreased gross motor;decreased fine motor LLE Deficits / Details: no movement to MMT or commands LLE Coordination: decreased fine motor;decreased gross motor       Communication   Communication: Tracheostomy  Cognition Arousal/Alertness: Lethargic Behavior During Therapy: Flat affect Overall Cognitive Status: Difficult to assess (no family present)                                        General  Comments General comments (skin integrity, edema, etc.): HR 80's to 90's, SpO2 mid 90's, BP 138/82   vent settings 40% FiO2, peep 5, PRVC.    Exercises Other Exercises Other Exercises: gental PROM to all 4 extremities including newly amputated R LE   Assessment/Plan    PT Assessment Patient needs continued PT services  PT Problem List Decreased strength;Decreased activity tolerance;Decreased mobility;Decreased coordination;Decreased safety awareness;Decreased knowledge of precautions;Cardiopulmonary status limiting activity       PT Treatment Interventions DME instruction;Gait training;Functional mobility training;Therapeutic activities;Therapeutic exercise;Balance training;Patient/family education;Neuromuscular re-education    PT Goals (Current goals can be found in the Care Plan section)  Acute Rehab PT Goals PT Goal Formulation: Patient unable to participate in goal setting Time For Goal Achievement: 07/21/19 Potential to Achieve Goals: Fair    Frequency Min 2X/week   Barriers to discharge Decreased caregiver support      Co-evaluation               AM-PAC PT "6 Clicks" Mobility  Outcome Measure Help needed turning from your back to your side while in a flat bed without using bedrails?: Total Help needed moving from lying on your back to sitting on the side of a flat bed without using bedrails?: Total Help needed moving to and from a bed to a chair (including a wheelchair)?: Total Help needed standing up from a chair using your arms (e.g., wheelchair or bedside chair)?: Total Help needed to walk in hospital room?: Total Help needed climbing 3-5 steps with a railing? : Total 6 Click Score: 6    End of Session Equipment Utilized During Treatment: Oxygen Activity Tolerance: Patient tolerated treatment well;Patient limited by lethargy Patient left: in bed;with call bell/phone within reach;with bed alarm set Nurse Communication: Mobility status PT Visit Diagnosis:  Other abnormalities of gait and mobility (R26.89);Muscle weakness (generalized) (M62.81)    Time: 1009-1040 PT Time Calculation (min) (ACUTE ONLY): 31 min   Charges:   PT Evaluation $PT Eval High Complexity: 1 High          07/07/2019  Ginger Carne., PT Acute Rehabilitation Services [phone]  (pager) [phone]  (office)  [name] 07/07/2019, 12:20 PM

## 2019-07-08 ENCOUNTER — Encounter (HOSPITAL_COMMUNITY): Payer: Self-pay | Admitting: Vascular Surgery

## 2019-07-08 DIAGNOSIS — B182 Chronic viral hepatitis C: Secondary | ICD-10-CM

## 2019-07-08 DIAGNOSIS — E1165 Type 2 diabetes mellitus with hyperglycemia: Secondary | ICD-10-CM

## 2019-07-08 DIAGNOSIS — Z66 Do not resuscitate: Secondary | ICD-10-CM

## 2019-07-08 DIAGNOSIS — Z515 Encounter for palliative care: Secondary | ICD-10-CM

## 2019-07-08 DIAGNOSIS — G9341 Metabolic encephalopathy: Secondary | ICD-10-CM

## 2019-07-08 DIAGNOSIS — Z9911 Dependence on respirator [ventilator] status: Secondary | ICD-10-CM

## 2019-07-08 LAB — CBC
HCT: 25.4 % — ABNORMAL LOW (ref 36.0–46.0)
Hemoglobin: 7.3 g/dL — ABNORMAL LOW (ref 12.0–15.0)
MCH: 24.7 pg — ABNORMAL LOW (ref 26.0–34.0)
MCHC: 28.7 g/dL — ABNORMAL LOW (ref 30.0–36.0)
MCV: 86.1 fL (ref 80.0–100.0)
Platelets: 106 10*3/uL — ABNORMAL LOW (ref 150–400)
RBC: 2.95 MIL/uL — ABNORMAL LOW (ref 3.87–5.11)
RDW: 25 % — ABNORMAL HIGH (ref 11.5–15.5)
WBC: 9.9 10*3/uL (ref 4.0–10.5)
nRBC: 0.2 % (ref 0.0–0.2)

## 2019-07-08 LAB — COMPREHENSIVE METABOLIC PANEL
ALT: 9 U/L (ref 0–44)
AST: 14 U/L — ABNORMAL LOW (ref 15–41)
Albumin: 1.6 g/dL — ABNORMAL LOW (ref 3.5–5.0)
Alkaline Phosphatase: 110 U/L (ref 38–126)
Anion gap: 8 (ref 5–15)
BUN: 46 mg/dL — ABNORMAL HIGH (ref 8–23)
CO2: 24 mmol/L (ref 22–32)
Calcium: 8.2 mg/dL — ABNORMAL LOW (ref 8.9–10.3)
Chloride: 116 mmol/L — ABNORMAL HIGH (ref 98–111)
Creatinine, Ser: 2.7 mg/dL — ABNORMAL HIGH (ref 0.44–1.00)
GFR calc Af Amer: 21 mL/min — ABNORMAL LOW (ref >60)
GFR calc non Af Amer: 18 mL/min — ABNORMAL LOW (ref >60)
Glucose, Bld: 166 mg/dL — ABNORMAL HIGH (ref 70–99)
Potassium: 3.4 mmol/L — ABNORMAL LOW (ref 3.5–5.1)
Sodium: 148 mmol/L — ABNORMAL HIGH (ref 135–145)
Total Bilirubin: 0.9 mg/dL (ref 0.3–1.2)
Total Protein: 7.7 g/dL (ref 6.5–8.1)

## 2019-07-08 LAB — GLUCOSE, CAPILLARY
Glucose-Capillary: 126 mg/dL — ABNORMAL HIGH (ref 70–99)
Glucose-Capillary: 123 mg/dL — ABNORMAL HIGH (ref 70–99)
Glucose-Capillary: 148 mg/dL — ABNORMAL HIGH (ref 70–99)
Glucose-Capillary: 144 mg/dL — ABNORMAL HIGH (ref 70–99)
Glucose-Capillary: 146 mg/dL — ABNORMAL HIGH (ref 70–99)
Glucose-Capillary: 137 mg/dL — ABNORMAL HIGH (ref 70–99)

## 2019-07-08 LAB — SURGICAL PATHOLOGY

## 2019-07-08 MED ORDER — FREE WATER
200.0000 mL | Status: DC
  Administered 2019-07-08 – 2019-07-09 (×5): 200 mL

## 2019-07-08 MED ORDER — INSULIN ASPART 100 UNIT/ML ~~LOC~~ SOLN
0.0000 [IU] | SUBCUTANEOUS | Status: DC
  Administered 2019-07-08 (×3): 2 [IU] via SUBCUTANEOUS
  Administered 2019-07-09 (×3): 3 [IU] via SUBCUTANEOUS
  Administered 2019-07-09 – 2019-07-10 (×4): 2 [IU] via SUBCUTANEOUS
  Administered 2019-07-10: 3 [IU] via SUBCUTANEOUS
  Administered 2019-07-10 (×2): 2 [IU] via SUBCUTANEOUS
  Administered 2019-07-11 – 2019-07-12 (×6): 3 [IU] via SUBCUTANEOUS
  Administered 2019-07-12 – 2019-07-14 (×5): 2 [IU] via SUBCUTANEOUS

## 2019-07-08 MED ORDER — POTASSIUM CHLORIDE 20 MEQ/15ML (10%) PO SOLN
20.0000 meq | Freq: Once | ORAL | Status: AC
  Administered 2019-07-08: 20 meq
  Filled 2019-07-08: qty 15

## 2019-07-08 NOTE — Progress Notes (Signed)
NAME:  [name], MRN:  [id number], DOB:  August 25, 1955, LOS: 1 ADMISSION DATE:  06/27/2019, CONSULTATION DATE:  07/04/19 REFERRING MD:  Dr. Carlis Abbott, CHIEF COMPLAINT:  Post-op respiratory failure  Brief History   [name] is a 64yo f with a PMHx of diastolic CHF with anasarca, NSTEMI, COPD, DMII uncontrolled, anemia, CKD IIIB, hepatitis C with cirrhosis who presented for altered mental status. 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 She was unable to be extubated following surgery. CCM was consulted for ventilator management.  History of present illness   [name] is a 64yo f with a PMHx of diastolic CHF with anasarca, NSTEMI, COPD, DMII uncontrolled, anemia, CKD IIIB, hepatitis C with cirrhosis who presented to [facility] ED from [facility] facility for altered mental status. Per chart review, patient's nurse at the nursing facility had noted that she had been moaning and groaning without coherent conversation, though at baseline she is oriented and able to converse normally. Pt was also noted to be hypoxic to 58% while at nursing facility. She does not normally wear O2 at home. While at [facility], pt also developed desaturations and was placed and maintained on BiPAP with improvement in hypoxia. She developed worsening lethargy and unresponsiveness on 6/14 that required her to be placed on BiPAP again. Pt also underwent MRI that showed R foot abscess/myositis/tenosynovitis as well as L foot myositis and leg cellulitis. Not thought to be osteomyelitis. Vascular surgery was consulted and recommended right above knee amputation and left angiography. Patient's husband had refused amputation and consented to have R AKA on 6/17. Patient was transferred to [facility] for the procedure. She underwent AKA on 5/73 without complication. Extubation was attempted in the OR including with pressure support  but patient was unable to generate adequate volumes. She also was unable to be weaned while at PACU.  Notably, patient was admitted at [facility] between 5/9-5/20/21 for Group c strep bacteremia and bilateral leg edema, as well as R leg superficial wounds and cellulitis. Per discharge summary, patient's mental status was impaired and thought to be due to sepsis. During this hospitalization, she had a TEE (05/30/19) and prosthetic valve endocarditis was ruled out. Patient had also completed a course of Augmentin for bilateral lower extremity ulcers/cellulitis on June 8th.    Past Medical History  Diastolic CHF Chronic anemia COPD Diabetes mellitus II  Gastric ulcer NSTEMI CKD IIIB Hepatitis C with cirrhosis Chronic leg venous ulcers  Significant Hospital Events   6/10 > Admitted to [place] 6/12 > Transferred to [facility] from [place] 6/13 > rapid response called at [facility] due to worsening lethargy. Placed on BiPAP 6/17> R AKA   Consults:  Palliative care   Procedures:  6/17 > R leg AKA  Significant Diagnostic Tests:   CXR 6/10 > increasing CHF with R basilar atelectasis.   MRI L tibia/fibula 6/10 > nonspecific subcutaneous edema throughout RLL. No evidence of osteomyelitis. Mild diffuse intramuscular edema, myositis vs. Denervation changes  MRI R foot 6/10 > no evidence of acute osteomyelitis. Ill-defined multilobulated fluid collections at dorsal proximal foot and along dorsal and medial foot concerning for developing abscesses. Diffuse edema suggesting myositis. Mild extensor digitorum longus tenosynovitis  MRI L foot 6/10 > significantly limited exam, pt unable to complete full exam. Interval amputation of L great toe at first MTP joint. Findings c/w reactive osteitis. Early acute osteomyelitis thought  to be unlikely. Nonspecific myositis.  MRI L tibia/fibula 6/11 > negative for osteomyelitis or myositis. Diffuse subcutaneous edema, possible cellulitis. No  subcutaneous abscess though limited exam.  MRI Head 6/13 > no acute intracranial abnormality, chronic ischemic microangiopathy  CXR 6/20: bilateral effusions no significant air space disease.   Micro Data:  Ucx 6/10 > neg Blood cx 6/10 > 1/2 diptheroids COVID 6/10 > neg MRSA PCR 6/17 > positive  Antimicrobials:  Cefepime 6/10  > Vancomycin 6/10 >  Interim history/subjective:   Awake, not answering questions.   Objective   Blood pressure 131/64, pulse 88, temperature 98 F (36.7 C), temperature source Axillary, resp. rate 18, height 5' 5"  (1.651 m), weight 96 kg, SpO2 100 %.    Vent Mode: PSV;CPAP FiO2 (%):  [30 %-40 %] 30 % Set Rate:  [16 bmp] 16 bmp Vt Set:  [450 mL] 450 mL PEEP:  [5 cmH20] 5 cmH20 Pressure Support:  [12 cmH20] 12 cmH20 Plateau Pressure:  [22 cmH20] 22 cmH20   Intake/Output Summary (Last 24 hours) at 07/08/2019 1013 Last data filed at 07/08/2019 0959 Gross per 24 hour  Intake 1180 ml  Output 825 ml  Net 355 ml   Filed Weights   07/04/19 0000 07/07/19 0500 07/08/19 0500  Weight: 108.6 kg 101.9 kg 96 kg    Examination: General: Chronically ill-appearing elderly woman lying in bed in no acute distress HENT: [place]/AT, eyes anicteric Lungs: CTAB, no secretions from ET tube.  Breathing comfortably on pressure support 12+ CPAP 5. Cardiovascular: Regular rate and rhythm, no murmurs Abdomen: Obese, soft, nontender, nondistended Extremities: Right AKA-bandage clean, dry, intact, but recently changed.  Left lower extremity with amputated first toe. Neuro: Awake, not consistently following commands.  Not moving her upper extremities.  Lifts her head off the bed, tracks.  Resolved Hospital Problem list     Assessment & Plan:   Acute hypoxic respiratory failure requiring mechanical ventilation; was requiring BiPAP pre-op COPD- likely previously exacerbated at admission -Continue low tidal volume ventilation, 4-8 cc/kg ideal body weight goal plateau less than  30 driving pressure less than 15 -VAP prevention protocol -Daily SAT and SBT -Titrate PEEP and FiO2 per ARDS protocol Continue bronchodilators  Status post right AKA for chronic foot ischemia Acute blood loss anemia from surgery; ongoing bleeding from incision -Appreciate vascular surgery's input -Holding DVT prophylaxis due to ongoing bleeding -Rechecking hemoglobin today  Acute metabolic encephalopathy-likely due to acute illness, sedation, ICU delirium, chronic liver and kidney disease -Frequent reorientation -Minimize sedation as much as possible  Chronic HCV, carries diagnosis of probable cirrhosis. Imaging not confirmatory. -Continue lactulose  AKI on CKD  3b -Recheck CMP today -Renally dose meds and avoid nephrotoxic meds   Type 2 diabetes -Continue Accu-Cheks every 4 hours with sliding scale insulin as needed -Goal BG 100 4880 while admitted to the ICU  Chronic debility; resident of SNF PTA -Appreciate palliative care's input -We will need PT, OT, SLP post extubation  Sleep apnea -We will need NIPPV post extubation     Daily Goals Checklist  Pain/Anxiety/Delirium protocol (if indicated): keep off all sedation if possible VAP protocol (if indicated): bundle in place.  Respiratory support goals: SBT -likely extubation today. Blood pressure target: MAP >65 currently on no support. DVT prophylaxis: UFH Nutrition Status: Initiate tube feeds via Cortrak - moderate nutritional risk GI prophylaxis: Protonix Fluid status goals: edema but creatinine rising. Hold diuresis for today.  Urinary catheter: Assessment of intravascular volume Central lines: PIV Glucose control: T2  DM currently euglycemic on SSI Mobility/therapy needs: bedbound at baseline.  Antibiotic de-escalation: Complete perioperative antibiotics Home medication reconciliation: On hold. Daily labs: CBC, BMP Code Status: Full  Family Communication: husband Disposition: ICU   Labs   CBC: Recent  Labs  Lab 07/02/19 0827 07/03/19 0448 07/04/19 1756 07/05/19 0759 07/06/19 0600  WBC 9.0 8.0 12.4* 8.3 11.5*  HGB 9.1* 8.3* 5.5* 6.7* 8.3*  HCT 34.6* 31.4* 20.6* 23.4* 27.5*  MCV 84.4 85.6 84.4 81.0 81.8  PLT 140* 136* 133* 125* 108*    Basic Metabolic Panel: Recent Labs  Lab 07/02/19 0827 07/03/19 0448 07/04/19 1636 07/05/19 0759 07/06/19 0600  NA 145 143 146* 146* 146*  K 4.5 4.2 4.7 4.7 4.1  CL 116* 112* 117* 116* 115*  CO2 22 21* 21* 22 22  GLUCOSE 119* 116* 124* 138* 160*  BUN 37* 36* 34* 37* 42*  CREATININE 2.00* 2.00* 1.92* 2.10* 2.29*  CALCIUM 8.5* 8.4* 8.1* 8.1* 8.2*   GFR: Estimated Creatinine Clearance: 28.4 mL/min (A) (by C-G formula based on SCr of 2.29 mg/dL (H)). Recent Labs  Lab 07/03/19 0448 07/04/19 1756 07/05/19 0759 07/06/19 0600  WBC 8.0 12.4* 8.3 11.5*    Liver Function Tests: Recent Labs  Lab 07/04/19 1636 07/05/19 0759  AST 14* 16  ALT 8 10  ALKPHOS 109 101  BILITOT 0.8 1.7*  PROT 6.8 7.0  ALBUMIN 1.6* 1.7*   No results for input(s): LIPASE, AMYLASE in the last 168 hours. Recent Labs  Lab 07/04/19 1636 07/05/19 1213  AMMONIA 21 20    ABG    Component Value Date/Time   PHART 7.388 07/04/2019 1400   PCO2ART 34.5 07/04/2019 1400   PO2ART 107 07/04/2019 1400   HCO3 20.4 07/04/2019 1400   TCO2 28 09/29/2006 1033   ACIDBASEDEF 3.8 (H) 07/04/2019 1400   O2SAT 98.3 07/04/2019 1400     Coagulation Profile: No results for input(s): INR, PROTIME in the last 168 hours.  Cardiac Enzymes: No results for input(s): CKTOTAL, CKMB, CKMBINDEX, TROPONINI in the last 168 hours.  HbA1C: Hgb A1c MFr Bld  Date/Time Value Ref Range Status  05/27/2019 05:30 AM 7.4 (H) 4.8 - 5.6 % Final    Comment:    (NOTE) Pre diabetes:          5.7%-6.4% Diabetes:              >6.4% Glycemic control for   <7.0% adults with diabetes   05/08/2019 05:08 AM 6.8 (H) 4.8 - 5.6 % Final    Comment:    (NOTE) Pre diabetes:           5.7%-6.4% Diabetes:              >6.4% Glycemic control for   <7.0% adults with diabetes     CBG: Recent Labs  Lab 07/07/19 1545 07/07/19 1943 07/07/19 2348 07/08/19 0359 07/08/19 0721  GLUCAP 83 90 104* 126* 123*    This patient is critically ill with multiple organ system failure which requires frequent high complexity decision making, assessment, support, evaluation, and titration of therapies. This was completed through the application of advanced monitoring technologies and extensive interpretation of multiple databases. During this encounter critical care time was devoted to patient care services described in this note for 45 minutes.  Julian Hy, DO 07/08/19 10:54 AM [facility]

## 2019-07-08 NOTE — Progress Notes (Signed)
[facility] Progress Note Patient Name: [name] DOB: 10-10-1955 MRN: [id number]   Date of Service  07/08/2019  HPI/Events of Note  RN requests flexiseal.  RN also asks whether to hold [place] heparin this AM given that the patient has blood-soaked bandage over amputation site. Inspected visually by camera.   eICU Interventions  Hold heparin dose this morning. Will ensure surgery examines wound this AM and helps to guide management of bleeding.  FMS ordered.     Intervention Category Intermediate Interventions: Bleeding - evaluation and treatment with blood products Minor Interventions: Routine modifications to care plan (e.g. PRN medications for pain, fever)  Marily Lente Clarance Bollard 07/08/2019, 5:36 AM

## 2019-07-08 NOTE — Progress Notes (Addendum)
CSW received call from [place], [place] Director stating this patient received a visit from [place] at [phone].   CSW spoke with Ronni from [place] who states she had to complete a weekly check in with the patient. Ronni requesting updates from CSW once new information is available.  Madilyn Fireman, MSW, LCSW-A Transitions of Care   Clinical Social Worker  [facility] Emergency Departments   Medical ICU [phone]

## 2019-07-08 NOTE — Progress Notes (Signed)
Daily Progress Note   Patient Name: [name]       Date: 07/08/2019 DOB: 04-Oct-1955  Age: 64 y.o. MRN#: [id number] Attending Physician: Julian Hy, DO Primary Care Physician: [place] Date: 06/27/2019  Reason for Consultation/Follow-up: To discuss complex medical decision making related to patient's goals of care  Subjective: I spoke with husband [name] at bedside.  He is beside himself as APS has just visited he and his wife in the hospital.  [name] describes very poor living arrangements.  He states their landlord is trying to get them out of the apartment and have it condemned.  He is very upset.     [name] and "[name]" have been together 37 years.   They are both spiritual people.  They mostly only have eachother for support.  [name] is disabled and requires a wheel chair.  He can not drive.   [name] desperately wants his wife to improve.  After a conversation of talking about their living arrangements and history.  We talked about her current state.  She recognizes him and can nod her head appropriately.    [name] is very complimentary about the MDs and RN/Techs in ICU.  He is so pleased with the care his wife is receiving.  He knows we want his wife to get better.  I asked if the worst case happens and her heart stops, do we resuscitate?  [name] replies "No". If she gets worse, he does not want her to suffer further.  I ask - if we can successfully extubate her, but the she gets worse after that - do we put the tube back in? [name] replies "no".    I reassured [name] that we are doing everything we can to improve his wife's health and we will take it one day at a time.  [name] asked me to call his wife's sister in [place] and explain the  situation to her.  I relayed our conversation to [name].  She understood and supported [name].   Per [name] - "we have no control.  It's in the Master's hands".  I agreed.   Assessment: Patient post right AKA 6/17.  Has not been able to be extubated.  Had aspiration event.  Remains confused but awake and responsive.   Patient Profile/HPI:  64  y.o. female  with past medical history of chronic systolic heart failure, COPD, DM2, DOE, OSA, Hep C, other cirrhosis, CKD III, lower extremity cellulitis, lower extremity venous stasis ulcers,  admitted on 06/27/2019 with complaints of altered mental status. Workup reveals metabolic encephalopathy in the setting of infectious process related to lower extremity cellulitis, myositis, osteoitis. Vascular surgery consulted and recommend BKA. Spouse initially has not consented for surgery. Palliative consulted for [place].    Length of Stay: 11   Vital Signs: BP 133/66   Pulse 89   Temp (!) 97.5 F (36.4 C) (Oral)   Resp 18   Ht 5' 5"  (1.651 m)   Wt 96 kg   SpO2 100%   BMI 35.22 kg/m  SpO2: SpO2: 100 % O2 Device: O2 Device: Ventilator O2 Flow Rate: O2 Flow Rate (L/min): 1 L/min       Palliative Assessment/Data: 10%     Palliative Care Plan    Recommendations/Plan:  Full scope treament, but if she arrests then do not resuscitate.  Hoping for successful extubation.  Afterward extubation Husband states no re-intubation.  Husband asked for financial counselor consult.  She is completely disabled. Can she start Medicare benefits?  Husband requests that she not return to [facility].  Wants a good SNF in [place] (near home).  Code Status:  DNR  Prognosis:  Very fragile.  Poor prognosis. Likely weeks even with full support.  Discharge Planning:  To Be Determined.   Husband does not want her to return to [facility].  He requests a SNF near [place].  Care plan was discussed with RN, husband and sister  Thank you  for allowing the Palliative Medicine Team to assist in the care of this patient.  Total time spent:  60 min.     Greater than 50%  of this time was spent counseling and coordinating care related to the above assessment and plan.  Tanya Jenny, PA-C Palliative Medicine  Please contact Palliative MedicineTeam phone at [phone] for questions and concerns between 7 am - 7 pm.   Please see AMION for individual provider pager numbers.

## 2019-07-08 NOTE — Progress Notes (Addendum)
  Progress Note    07/08/2019 7:49 AM 4 Days Post-Op  Subjective:  Sedated on vent   Vitals:   07/08/19 0600 07/08/19 0727  BP: 131/64   Pulse: 91   Resp: 17   Temp:  98 F (36.7 C)  SpO2: 100%    Physical Exam: Cardiac:  regular Lungs: on ventilator Incisions:  Right above knee amputation site healing well. There is some significant oozing from medial staple. Does not appear to be coming from incision line but from the staple itself. Redressed Extremities:  Left foot warm. Palpable DP. Left heel pressure wound dressed Abdomen:  Obese, soft Neurologic: on vent sedated, does   CBC    Component Value Date/Time   WBC 11.5 (H) 07/06/2019 0600   RBC 3.36 (L) 07/06/2019 0600   HGB 8.3 (L) 07/06/2019 0600   HGB 7.3 (L) 11/23/2017 1454   HCT 27.5 (L) 07/06/2019 0600   HCT 45.7 08/03/2013 2138   PLT 108 (L) 07/06/2019 0600   PLT 118 (L) 08/03/2013 2138   MCV 81.8 07/06/2019 0600   MCV 89 08/03/2013 2138   MCH 24.7 (L) 07/06/2019 0600   MCHC 30.2 07/06/2019 0600   RDW 24.3 (H) 07/06/2019 0600   RDW 13.6 08/03/2013 2138   LYMPHSABS 0.7 06/28/2019 0446   LYMPHSABS 3.4 08/03/2013 2138   MONOABS 1.2 (H) 06/28/2019 0446   MONOABS 0.7 08/03/2013 2138   EOSABS 0.2 06/28/2019 0446   EOSABS 0.3 08/03/2013 2138   BASOSABS 0.1 06/28/2019 0446   BASOSABS 0.1 08/03/2013 2138    BMET    Component Value Date/Time   NA 146 (H) 07/06/2019 0600   NA 139 08/03/2013 2138   K 4.1 07/06/2019 0600   K 3.4 (L) 08/03/2013 2138   CL 115 (H) 07/06/2019 0600   CL 104 08/03/2013 2138   CO2 22 07/06/2019 0600   CO2 27 08/03/2013 2138   GLUCOSE 160 (H) 07/06/2019 0600   GLUCOSE 153 (H) 08/03/2013 2138   BUN 42 (H) 07/06/2019 0600   BUN 16 08/03/2013 2138   CREATININE 2.29 (H) 07/06/2019 0600   CREATININE 1.19 08/03/2013 2138   CALCIUM 8.2 (L) 07/06/2019 0600   CALCIUM 9.2 08/03/2013 2138   GFRNONAA 22 (L) 07/06/2019 0600   GFRNONAA 50 (L) 08/03/2013 2138   GFRAA 25 (L) 07/06/2019  0600   GFRAA 58 (L) 08/03/2013 2138    INR    Component Value Date/Time   INR 1.5 (H) 07/01/2019 0736   INR 0.9 03/12/2012 1104     Intake/Output Summary (Last 24 hours) at 07/08/2019 0749 Last data filed at 07/08/2019 0600 Gross per 24 hour  Intake 880 ml  Output 805 ml  Net 75 ml     Assessment/Plan:  64 y.o. female is s/p right above knee amputation 4 Days Post-Op.  Sq heparin is being held due to bleeding at amputation site. Compression dressing applied. Continue to monitor  DVT prophylaxis:  Sq heparin   Marval Regal [facility] [phone] 07/08/2019 7:49 AM   I have independently interviewed and examined patient and agree with PA assessment and plan above.   Saber Dickerman C. Donzetta Matters, MD [facility] Office: [phone] Pager: [phone]

## 2019-07-08 NOTE — Evaluation (Addendum)
Occupational Therapy Evaluation Patient Details Name: [name] MRN: [id number] DOB: Dec 08, 1955 Today's Date: 07/08/2019    History of Present Illness Jaylon Grode is a 64yo f with a PMHx of diastolic CHF with anasarca, NSTEMI, COPD, DMII uncontrolled, anemia, CKD IIIB, hepatitis C with cirrhosis who presented for altered mental status. 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 She was unable to be extubated following surgery. CCM was consulted for ventilator management.   Clinical Impression   Pt PTA: Pt unable to state information due to trach + vent. Pt currently performing ADL tasks with totalA. Pt tactile cues and hand over hand assist for washing face, but pt unable to fully grasp wash cloth. Pt  totalA +2 for bed mobility to long sitting in bed. Pt s/p new R BKA. Pt with cognitive deficits for following commands. Pt's spouse in room upon OTR exiting. Pt would benefit from continued OT skilled services for ADL, mobility and cognition. OT following acutely.      Follow Up Recommendations  SNF    Equipment Recommendations  Other (comment) (defer to next facility)    Recommendations for Other Services       Precautions / Restrictions Precautions Precautions: Fall Restrictions Weight Bearing Restrictions: No      Mobility Bed Mobility Overal bed mobility: Needs Assistance       Supine to sit: Total assist;+2 for physical assistance     General bed mobility comments: pt performing long sitting with +2 totalA. Pt unable to assist  Transfers                 General transfer comment: unable    Balance Overall balance assessment: History of Falls;Needs assistance   Sitting balance-Leahy Scale: Poor Sitting balance - Comments: pt unable to tolerate                                   ADL either performed or assessed with clinical judgement   ADL Overall ADL's :  Needs assistance/impaired Eating/Feeding: NPO   Grooming: Total assistance   Upper Body Bathing: Total assistance   Lower Body Bathing: Total assistance   Upper Body Dressing : Total assistance   Lower Body Dressing: Total assistance   Toilet Transfer: Total assistance   Toileting- Clothing Manipulation and Hygiene: Total assistance       Functional mobility during ADLs: Total assistance;+2 for physical assistance (long sitting in bed) General ADL Comments: Pt totalA at this time- vented and following very few commands.      Vision Baseline Vision/History: Wears glasses Wears Glasses: Reading only Vision Assessment?: Vision impaired- to be further tested in functional context Additional Comments: continue to assess and command follow improves     Perception     Praxis      Pertinent Vitals/Pain Pain Assessment: Faces Faces Pain Scale: No hurt Pain Intervention(s): Monitored during session     Hand Dominance Right   Extremity/Trunk Assessment Upper Extremity Assessment Upper Extremity Assessment: Generalized weakness   Lower Extremity Assessment Lower Extremity Assessment: Generalized weakness RLE Deficits / Details: PROM with increased tone noted in all joints; hand squeeze 4/5 MM grace; wincing with shoulder PROM RLE Coordination: decreased fine motor;decreased gross motor LLE Deficits / Details: PROM with increased tone noted in all joints; hand squeeze 4/5 MM grace; wincing with shoulder PROM LLE Coordination: decreased fine motor;decreased  gross motor   Cervical / Trunk Assessment Cervical / Trunk Assessment: Normal   Communication Communication Communication: Tracheostomy   Cognition Arousal/Alertness: Lethargic Behavior During Therapy: Flat affect Overall Cognitive Status: Difficult to assess                                     General Comments  HR <90s    Exercises Exercises: Other exercises Other Exercises Other Exercises:  PROM to BUEs, gentle   Shoulder Instructions      Home Living Family/patient expects to be discharged to:: Skilled nursing facility Living Arrangements: Spouse/significant other Available Help at Discharge: Family Type of Home: House Home Access: Stairs to enter;Ramped entrance Entrance Stairs-Number of Steps: 3 Entrance Stairs-Rails: Right;Left;Can reach both Home Layout: One level     Bathroom Shower/Tub: Teacher, early years/pre: Standard     Home Equipment: Environmental consultant - 2 wheels;Wheelchair - [employer];Tub bench;Grab bars - tub/shower;Hospital bed   Additional Comments: Per chart as of recent admission, lived with husband who is wheelchair-bound as well. used a walker to get from her hospital bed in the living room to her w/c and /or bathroom per patient report.      Prior Functioning/Environment Level of Independence: Needs assistance    ADL's / Homemaking Assistance Needed: Per chart, pt requiring ADL assist            OT Problem List: Decreased range of motion;Decreased strength;Decreased coordination;Decreased activity tolerance;Impaired UE functional use;Pain;Increased edema;Decreased safety awareness;Impaired vision/perception;Impaired balance (sitting and/or standing);Decreased knowledge of use of DME or AE;Cardiopulmonary status limiting activity      OT Treatment/Interventions: Self-care/ADL training;Therapeutic exercise;Energy conservation;DME and/or AE instruction;Therapeutic activities;Visual/perceptual remediation/compensation;Cognitive remediation/compensation;Patient/family education;Balance training    OT Goals(Current goals can be found in the care plan section) Acute Rehab OT Goals Patient Stated Goal: "to get better" OT Goal Formulation: With patient Time For Goal Achievement: 07/22/19 Potential to Achieve Goals: Good ADL Goals Pt Will Perform Eating: with set-up;sitting;bed level Pt Will Perform Grooming: with mod assist;bed  level Pt/caregiver will Perform Home Exercise Program: Increased strength;Both right and left upper extremity Additional ADL Goal #1: Pt will consistently follow 1-2 step commands in order to increase carry over of skills from session to session. Additional ADL Goal #2: Pt will tolerate EOB x6 mins with moderate assist for stability as precursor for ADL.  OT Frequency: Min 2X/week   Barriers to D/C: Decreased caregiver support          Co-evaluation              AM-PAC OT "6 Clicks" Daily Activity     Outcome Measure Help from another person eating meals?: Total Help from another person taking care of personal grooming?: Total Help from another person toileting, which includes using toliet, bedpan, or urinal?: Total Help from another person bathing (including washing, rinsing, drying)?: Total Help from another person to put on and taking off regular upper body clothing?: Total Help from another person to put on and taking off regular lower body clothing?: Total 6 Click Score: 6   End of Session Equipment Utilized During Treatment: Oxygen Nurse Communication: Mobility status  Activity Tolerance: Treatment limited secondary to medical complications (Comment) Patient left: in chair  OT Visit Diagnosis: Unsteadiness on feet (R26.81);Muscle weakness (generalized) (M62.81)                Time: 9147-8295 OT Time Calculation (min): 25 min  Charges:  OT General Charges $OT Visit: 1 Visit OT Evaluation $OT Eval Moderate Complexity: 1 Mod OT Treatments $Self Care/Home Management : 8-22 mins  Jefferey Pica, OTR/L Acute Rehabilitation Services Pager: [phone] Office: [phone]  Enda Santo C 07/08/2019, 2:55 PM

## 2019-07-09 DIAGNOSIS — Z66 Do not resuscitate: Secondary | ICD-10-CM

## 2019-07-09 LAB — COMPREHENSIVE METABOLIC PANEL
ALT: 9 U/L (ref 0–44)
AST: 11 U/L — ABNORMAL LOW (ref 15–41)
Albumin: 1.5 g/dL — ABNORMAL LOW (ref 3.5–5.0)
Alkaline Phosphatase: 90 U/L (ref 38–126)
Anion gap: 8 (ref 5–15)
BUN: 45 mg/dL — ABNORMAL HIGH (ref 8–23)
CO2: 22 mmol/L (ref 22–32)
Calcium: 7.9 mg/dL — ABNORMAL LOW (ref 8.9–10.3)
Chloride: 119 mmol/L — ABNORMAL HIGH (ref 98–111)
Creatinine, Ser: 2.62 mg/dL — ABNORMAL HIGH (ref 0.44–1.00)
GFR calc Af Amer: 22 mL/min — ABNORMAL LOW (ref >60)
GFR calc non Af Amer: 19 mL/min — ABNORMAL LOW (ref >60)
Glucose, Bld: 204 mg/dL — ABNORMAL HIGH (ref 70–99)
Potassium: 3.7 mmol/L (ref 3.5–5.1)
Sodium: 149 mmol/L — ABNORMAL HIGH (ref 135–145)
Total Bilirubin: 1 mg/dL (ref 0.3–1.2)
Total Protein: 7 g/dL (ref 6.5–8.1)

## 2019-07-09 LAB — GLUCOSE, CAPILLARY
Glucose-Capillary: 152 mg/dL — ABNORMAL HIGH (ref 70–99)
Glucose-Capillary: 177 mg/dL — ABNORMAL HIGH (ref 70–99)
Glucose-Capillary: 160 mg/dL — ABNORMAL HIGH (ref 70–99)
Glucose-Capillary: 134 mg/dL — ABNORMAL HIGH (ref 70–99)
Glucose-Capillary: 152 mg/dL — ABNORMAL HIGH (ref 70–99)
Glucose-Capillary: 115 mg/dL — ABNORMAL HIGH (ref 70–99)

## 2019-07-09 LAB — PHOSPHORUS: Phosphorus: 1.9 mg/dL — ABNORMAL LOW (ref 2.5–4.6)

## 2019-07-09 LAB — MAGNESIUM: Magnesium: 1.7 mg/dL (ref 1.7–2.4)

## 2019-07-09 MED ORDER — FREE WATER
300.0000 mL | Status: DC
  Administered 2019-07-09 – 2019-07-11 (×12): 300 mL

## 2019-07-09 MED ORDER — MAGNESIUM SULFATE 2 GM/50ML IV SOLN
2.0000 g | Freq: Once | INTRAVENOUS | Status: AC
  Administered 2019-07-09: 2 g via INTRAVENOUS
  Filled 2019-07-09: qty 50

## 2019-07-09 MED ORDER — LACTATED RINGERS IV BOLUS
500.0000 mL | Freq: Once | INTRAVENOUS | Status: AC
  Administered 2019-07-09: 500 mL via INTRAVENOUS

## 2019-07-09 MED ORDER — LACTATED RINGERS IV BOLUS: 500.0000 mL | Freq: Once | INTRAVENOUS | Status: DC

## 2019-07-09 MED ORDER — POTASSIUM & SODIUM PHOSPHATES 280-160-250 MG PO PACK
2.0000 | PACK | Freq: Once | ORAL | Status: AC
  Administered 2019-07-09: 2
  Filled 2019-07-09: qty 2

## 2019-07-09 NOTE — Progress Notes (Signed)
NAME:  [name], MRN:  [id number], DOB:  January 12, 1956, LOS: 4 ADMISSION DATE:  06/27/2019, CONSULTATION DATE:  07/04/19 REFERRING MD:  Dr. Carlis Abbott, CHIEF COMPLAINT:  Post-op respiratory failure  Brief History   [name] is a 64yo f with a PMHx of diastolic CHF with anasarca, NSTEMI, COPD, DMII uncontrolled, anemia, CKD IIIB, hepatitis C with cirrhosis who presented for altered mental status. 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 She was unable to be extubated following surgery. CCM was consulted for ventilator management.  History of present illness   [name] is a 64yo f with a PMHx of diastolic CHF with anasarca, NSTEMI, COPD, DMII uncontrolled, anemia, CKD IIIB, hepatitis C with cirrhosis who presented to [facility] ED from [place] facility for altered mental status. Per chart review, patient's nurse at the nursing facility had noted that she had been moaning and groaning without coherent conversation, though at baseline she is oriented and able to converse normally. Pt was also noted to be hypoxic to 58% while at nursing facility. She does not normally wear O2 at home. While at [facility], pt also developed desaturations and was placed and maintained on BiPAP with improvement in hypoxia. She developed worsening lethargy and unresponsiveness on 6/14 that required her to be placed on BiPAP again. Pt also underwent MRI that showed R foot abscess/myositis/tenosynovitis as well as L foot myositis and leg cellulitis. Not thought to be osteomyelitis. Vascular surgery was consulted and recommended right above knee amputation and left angiography. Patient's husband had refused amputation and consented to have R AKA on 6/17. Patient was transferred to [facility] for the procedure. She underwent AKA on 5/17 without complication. Extubation was attempted in the OR including with pressure support  but patient was unable to generate adequate volumes. She also was unable to be weaned while at PACU.  Notably, patient was admitted at [facility] between 5/9-5/20/21 for Group c strep bacteremia and bilateral leg edema, as well as R leg superficial wounds and cellulitis. Per discharge summary, patient's mental status was impaired and thought to be due to sepsis. During this hospitalization, she had a TEE (05/30/19) and prosthetic valve endocarditis was ruled out. Patient had also completed a course of Augmentin for bilateral lower extremity ulcers/cellulitis on June 8th.    Past Medical History  Diastolic CHF Chronic anemia COPD Diabetes mellitus II  Gastric ulcer NSTEMI CKD IIIB Hepatitis C with cirrhosis Chronic leg venous ulcers  Significant Hospital Events   6/10 > Admitted to [place] 6/12 > Transferred to [facility] from [place] 6/13 > rapid response called at [facility] due to worsening lethargy. Placed on BiPAP 6/17> R AKA   Consults:  Palliative care  Vascular surgery  Procedures:  6/17 > R leg AKA  Significant Diagnostic Tests:   CXR 6/10 > increasing CHF with R basilar atelectasis.   MRI L tibia/fibula 6/10 > nonspecific subcutaneous edema throughout RLL. No evidence of osteomyelitis. Mild diffuse intramuscular edema, myositis vs. Denervation changes  MRI R foot 6/10 > no evidence of acute osteomyelitis. Ill-defined multilobulated fluid collections at dorsal proximal foot and along dorsal and medial foot concerning for developing abscesses. Diffuse edema suggesting myositis. Mild extensor digitorum longus tenosynovitis  MRI L foot 6/10 > significantly limited exam, pt unable to complete full exam. Interval amputation of L great toe at first MTP joint. Findings c/w reactive osteitis. Early acute  osteomyelitis thought to be unlikely. Nonspecific myositis.  MRI L tibia/fibula 6/11 > negative for osteomyelitis or myositis. Diffuse subcutaneous edema, possible  cellulitis. No subcutaneous abscess though limited exam.  MRI Head 6/13 > no acute intracranial abnormality, chronic ischemic microangiopathy  CXR 6/20: bilateral effusions no significant air space disease.   Micro Data:  Ucx 6/10 > neg Blood cx 6/10 > 1/2 diptheroids COVID 6/10 > neg MRSA PCR 6/17 > positive  Antimicrobials:  Cefepime 6/10  > Vancomycin 6/10 >  Interim history/subjective:  Palliative care has had discussions with the family, who wish for ongoing aggressive care but understand that she is very ill and have changed her code status to DNR. She would not be reintubated if she worsened post-extubation. Today she denies complaints. On SBT 8/5 this AM. Per her husband, she can move her arms.   Objective   Blood pressure (!) 115/58, pulse 99, temperature (!) 97.4 F (36.3 C), temperature source Axillary, resp. rate 18, height 5' 5"  (1.651 m), weight 99.4 kg, SpO2 100 %.    Vent Mode: PSV;CPAP FiO2 (%):  [30 %-40 %] 40 % Set Rate:  [16 bmp] 16 bmp Vt Set:  [450 mL] 450 mL PEEP:  [5 cmH20] 5 cmH20 Pressure Support:  [8 EQA83-41 cmH20] 8 cmH20 Plateau Pressure:  [20 cmH20] 20 cmH20   Intake/Output Summary (Last 24 hours) at 07/09/2019 0930 Last data filed at 07/09/2019 0900 Gross per 24 hour  Intake 1860 ml  Output 980 ml  Net 880 ml   Filed Weights   07/07/19 0500 07/08/19 0500 07/09/19 0440  Weight: 101.9 kg 96 kg 99.4 kg    Examination: General: chronically ill appearing woman laying in bed in NAD, appears stated age HENT: [place]/AT, eyes anicteric.  Lungs: Mild rhonchi on the left, clear secretions from ETT. Breathing comfortably on CPAP 5 + PS 8.  Cardiovascular: regular rate and rhythm Abdomen: obese, soft, NT, ND Extremities: Amputated L first toe, R BKA. No LLE edema. Arms barely moving, but attempting to move RUE on command. Neuro: Awake, tracking, nodding slightly.   Resolved Hospital Problem list     Assessment & Plan:   Acute hypoxic respiratory  failure requiring mechanical ventilation; was requiring BiPAP pre-op COPD- likely previously exacerbated at admission -VAP prevention protocol. -Daily SAT and SBT- doing well on 5+8 today.  -Titrate PEEP and FiO2 per ARDS protocol -Continue bronchodilators. -Passed weaning parameters. Discussed with her husband that at this point the risks and benefits now favor extubation given the ongoing risk of prolonged MV. Will extubate to bipap. She is optimized, but I remain concern about her long-term prognosis given her multiple chronic medical conditions and weakness, which I discussed with her husband. Will update Palliative Care team on her progress.  Status post right AKA for chronic foot ischemia Acute blood loss anemia from surgery; ongoing bleeding from incision -Appreciate vascular surgery's assistance  -Holding DVT prophylaxis due to ongoing bleeding -Serial CBCs  Acute metabolic encephalopathy-likely due to acute illness, sedation, ICU delirium, chronic liver and kidney disease -Frequent reorientation -Minimize sedation as much as possible -agree with her husband that it is helpful to have family visitors, but extubation would be a step in the right direction to help with delirium  Chronic HCV, carries diagnosis of probable cirrhosis. Imaging not confirmatory. -Continue lactulose  AKI on CKD 3b, worsening -Daily monitoring. -Renally dose meds and avoid nephrotoxic meds  Hypernatremia   -increase FWF; anticipate that she may need an NG tube after extubation  Type 2 diabetes; BG at goal -Continue sliding scale insulin as needed -Goal BG 140-180 while admitted to the ICU  Chronic debility; resident of SNF PTA -Appreciate palliative care's input.  -We will need PT, OT, SLP post extubation  Sleep apnea -nocturnal + PRN NIPPV     Discussed her care with her husband. Their wedding anniversary is coming up in July. He is very     Daily Goals Checklist  Pain/Anxiety/Delirium  protocol (if indicated): keep off all sedation  VAP protocol (if indicated): yes Respiratory support goals: SBT -likely extubation today. Blood pressure target: MAP >65 currently on no support. DVT prophylaxis: on hold due to bleeding Nutrition Status: tube feeds via Cortrak - moderate nutritional risk  GI prophylaxis: Protonix Urinary catheter: Assessment of intravascular volume Central lines: PIV Glucose control: T2 DM currently euglycemic on SSI Mobility/therapy needs: bedbound at baseline Daily labs: CBC, BMP Code Status: Full  Family Communication: husband updated via phone today Disposition: ICU   Labs   CBC: Recent Labs  Lab 07/03/19 0448 07/04/19 1756 07/05/19 0759 07/06/19 0600 07/08/19 1108  WBC 8.0 12.4* 8.3 11.5* 9.9  HGB 8.3* 5.5* 6.7* 8.3* 7.3*  HCT 31.4* 20.6* 23.4* 27.5* 25.4*  MCV 85.6 84.4 81.0 81.8 86.1  PLT 136* 133* 125* 108* 106*    Basic Metabolic Panel: Recent Labs  Lab 07/03/19 0448 07/04/19 1636 07/05/19 0759 07/06/19 0600 07/08/19 1108  NA 143 146* 146* 146* 148*  K 4.2 4.7 4.7 4.1 3.4*  CL 112* 117* 116* 115* 116*  CO2 21* 21* 22 22 24   GLUCOSE 116* 124* 138* 160* 166*  BUN 36* 34* 37* 42* 46*  CREATININE 2.00* 1.92* 2.10* 2.29* 2.70*  CALCIUM 8.4* 8.1* 8.1* 8.2* 8.2*   GFR: Estimated Creatinine Clearance: 24.6 mL/min (A) (by C-G formula based on SCr of 2.7 mg/dL (H)). Recent Labs  Lab 07/04/19 1756 07/05/19 0759 07/06/19 0600 07/08/19 1108  WBC 12.4* 8.3 11.5* 9.9    Liver Function Tests: Recent Labs  Lab 07/04/19 1636 07/05/19 0759 07/08/19 1108  AST 14* 16 14*  ALT 8 10 9   ALKPHOS 109 101 110  BILITOT 0.8 1.7* 0.9  PROT 6.8 7.0 7.7  ALBUMIN 1.6* 1.7* 1.6*   No results for input(s): LIPASE, AMYLASE in the last 168 hours. Recent Labs  Lab 07/04/19 1636 07/05/19 1213  AMMONIA 21 20    ABG    Component Value Date/Time   PHART 7.388 07/04/2019 1400   PCO2ART 34.5 07/04/2019 1400   PO2ART 107 07/04/2019  1400   HCO3 20.4 07/04/2019 1400   TCO2 28 09/29/2006 1033   ACIDBASEDEF 3.8 (H) 07/04/2019 1400   O2SAT 98.3 07/04/2019 1400     Coagulation Profile: No results for input(s): INR, PROTIME in the last 168 hours.  Cardiac Enzymes: No results for input(s): CKTOTAL, CKMB, CKMBINDEX, TROPONINI in the last 168 hours.  HbA1C: Hgb A1c MFr Bld  Date/Time Value Ref Range Status  05/27/2019 05:30 AM 7.4 (H) 4.8 - 5.6 % Final    Comment:    (NOTE) Pre diabetes:          5.7%-6.4% Diabetes:              >6.4% Glycemic control for   <7.0% adults with diabetes   05/08/2019 05:08 AM 6.8 (H) 4.8 - 5.6 % Final    Comment:    (NOTE) Pre diabetes:          5.7%-6.4% Diabetes:              >  6.4% Glycemic control for   <7.0% adults with diabetes     CBG: Recent Labs  Lab 07/08/19 1511 07/08/19 1924 07/08/19 2316 07/09/19 0311 07/09/19 0714  GLUCAP 144* 146* 137* 152* 177*    This patient is critically ill with multiple organ system failure which requires frequent high complexity decision making, assessment, support, evaluation, and titration of therapies. This was completed through the application of advanced monitoring technologies and extensive interpretation of multiple databases. During this encounter critical care time was devoted to patient care services described in this note for 57 minutes.  Julian Hy, DO 07/09/19 10:47 AM [facility]

## 2019-07-09 NOTE — Progress Notes (Signed)
Pt taken off Bipap for a break.  Pt placed on 4L HFNC,  Sp02 100% RR 20, no WOB breathing noted at this time.  Bipap on Standby if needed

## 2019-07-09 NOTE — Procedures (Signed)
Extubation Procedure Note  Patient Details:   Name: [name] DOB: 1955/09/15 MRN: [id number]   Airway Documentation:    Vent end date: 07/09/19 Vent end time: 1220   Evaluation  O2 sats: stable throughout Complications: No apparent complications Patient did tolerate procedure well. Bilateral Breath Sounds: Diminished   Yes  Positive leak test.  [name] 07/09/2019, 12:35 PM

## 2019-07-09 NOTE — Progress Notes (Addendum)
  Progress Note    07/09/2019 8:00 AM 5 Days Post-Op  Subjective:  Patient remains intubated however is awake and communicating with head nodding   Vitals:   07/09/19 0600 07/09/19 0700  BP: (!) 96/41 (!) 115/58  Pulse: 74 99  Resp: 16 18  Temp:  (!) 97.4 F (36.3 C)  SpO2: 100% 100%    Physical Exam: Incisions:  R AKA incision with viable skin edges; 1 area at medial aspect of incision with slow sanguinous oozing   CBC    Component Value Date/Time   WBC 9.9 07/08/2019 1108   RBC 2.95 (L) 07/08/2019 1108   HGB 7.3 (L) 07/08/2019 1108   HGB 7.3 (L) 11/23/2017 1454   HCT 25.4 (L) 07/08/2019 1108   HCT 45.7 08/03/2013 2138   PLT 106 (L) 07/08/2019 1108   PLT 118 (L) 08/03/2013 2138   MCV 86.1 07/08/2019 1108   MCV 89 08/03/2013 2138   MCH 24.7 (L) 07/08/2019 1108   MCHC 28.7 (L) 07/08/2019 1108   RDW 25.0 (H) 07/08/2019 1108   RDW 13.6 08/03/2013 2138   LYMPHSABS 0.7 06/28/2019 0446   LYMPHSABS 3.4 08/03/2013 2138   MONOABS 1.2 (H) 06/28/2019 0446   MONOABS 0.7 08/03/2013 2138   EOSABS 0.2 06/28/2019 0446   EOSABS 0.3 08/03/2013 2138   BASOSABS 0.1 06/28/2019 0446   BASOSABS 0.1 08/03/2013 2138    BMET    Component Value Date/Time   NA 148 (H) 07/08/2019 1108   NA 139 08/03/2013 2138   K 3.4 (L) 07/08/2019 1108   K 3.4 (L) 08/03/2013 2138   CL 116 (H) 07/08/2019 1108   CL 104 08/03/2013 2138   CO2 24 07/08/2019 1108   CO2 27 08/03/2013 2138   GLUCOSE 166 (H) 07/08/2019 1108   GLUCOSE 153 (H) 08/03/2013 2138   BUN 46 (H) 07/08/2019 1108   BUN 16 08/03/2013 2138   CREATININE 2.70 (H) 07/08/2019 1108   CREATININE 1.19 08/03/2013 2138   CALCIUM 8.2 (L) 07/08/2019 1108   CALCIUM 9.2 08/03/2013 2138   GFRNONAA 18 (L) 07/08/2019 1108   GFRNONAA 50 (L) 08/03/2013 2138   GFRAA 21 (L) 07/08/2019 1108   GFRAA 58 (L) 08/03/2013 2138    INR    Component Value Date/Time   INR 1.5 (H) 07/01/2019 0736   INR 0.9 03/12/2012 1104     Intake/Output Summary  (Last 24 hours) at 07/09/2019 0800 Last data filed at 07/09/2019 0600 Gross per 24 hour  Intake 1760 ml  Output 980 ml  Net 780 ml     Assessment/Plan:  64 y.o. female is s/p left above knee amputation  5 Days Post-Op  - AKA healing well - dressing re-applied; if medial aspect of incision continues to bleed apply manual pressure - LLE PAD can be worked up when she improves clinically   Dagoberto Ligas, PA-C [facility] [phone] 07/09/2019 8:00 AM    I have independently interviewed and examined patient and agree with PA assessment and plan above.  Bleeding from medial aspect of incision appears to be a stable site.  We will place Dermabond to hopefully control the bleeding.  Gean Larose C. Donzetta Matters, MD [facility] [place] Office: [phone] Pager: [phone]

## 2019-07-09 NOTE — Progress Notes (Signed)
Daily Progress Note   Patient Name: [name]       Date: 07/09/2019 DOB: [birth year]  Age: 64 y.o. MRN#: [id number] Attending Physician: Julian Hy, DO Primary Care Physician: [place] Date: 06/27/2019  Reason for Consultation/Follow-up: Establishing goals of care  Subjective: Patient awake on bipap- nods "yes" to pain. RN at bedside conducting [place]- noted decreased urine output. I also noted APS visit. I called Ronni- patient's APS caseworker to determine if patient was under guardianship- patient is not. I called Kela Millin for update.  Discussed with Kela Millin that patient is extubated but still being supported with bipap. We also discussed her feeding tube, and wound healing issues. Kela Millin is hopeful that Lirio will continue to improve but he was able to verbalize that she is not "out of the woods". He notes that if she continues to improve he will like to continue to keep going, but if she starts to decline then we will have to reconsider goals of care.   ROS  Length of Stay: 12  Current Medications: Scheduled Meds:  . atorvastatin  40 mg Per Tube Daily  . chlorhexidine gluconate (MEDLINE KIT)  15 mL Mouth Rinse BID  . Chlorhexidine Gluconate Cloth  6 each Topical Daily  . docusate  100 mg Per Tube Daily  . feeding supplement (VITAL HIGH PROTEIN)  1,000 mL Per Tube Q24H  . folic acid  1 mg Per Tube Daily  . free water  300 mL Per Tube Q4H  . heparin  5,000 Units Subcutaneous Q8H  . hydrocortisone cream   Topical BID  . insulin aspart  0-15 Units Subcutaneous Q4H  . lactulose  20 g Per Tube TID  . mouth rinse  15 mL Mouth Rinse 10 times per day  . multivitamin with minerals  1 tablet Per Tube Daily  . mupirocin ointment   Nasal BID  .  pantoprazole sodium  40 mg Per Tube Daily    Continuous Infusions: . sodium chloride Stopped (07/04/19 0107)    PRN Meds: sodium chloride, acetaminophen **OR** acetaminophen  Physical Exam          Vital Signs: BP 128/67   Pulse 87   Temp 97.9 F (36.6 C) (Oral)   Resp 19   Ht 5' 5"  (1.651 m)   Wt 99.4  kg   SpO2 100%   BMI 36.47 kg/m  SpO2: SpO2: 100 % O2 Device: O2 Device: Bi-PAP O2 Flow Rate: O2 Flow Rate (L/min): 1 L/min  Intake/output summary:   Intake/Output Summary (Last 24 hours) at 07/09/2019 1654 Last data filed at 07/09/2019 1400 Gross per 24 hour  Intake 1510 ml  Output 800 ml  Net 710 ml   LBM: Last BM Date: 07/09/19 Baseline Weight: Weight: 115 kg Most recent weight: Weight: 99.4 kg       Palliative Assessment/Data: PPS: 10%      Patient Active Problem List   Diagnosis Date Noted  . Palliative care encounter   . DNR (do not resuscitate)   . Respiratory acidosis   . Hyperkalemia   . Advanced care planning/counseling discussion   . Chronic systolic CHF (congestive heart failure) ([place]) 06/27/2019  . Venous stasis ulcer ([place]) 06/27/2019  . Type 2 diabetes mellitus with stage 3b chronic kidney disease, with long-term current use of insulin ([place])   . DNR (do not resuscitate) discussion   . Acute metabolic encephalopathy   . Hypotension   . Acute kidney injury superimposed on CKD ([place])   . Goals of care, counseling/discussion   . Palliative care by specialist   . Anasarca 05/26/2019  . Cellulitis 05/26/2019  . Diabetic foot ulcer ([place]) 05/26/2019  . Decreased level of consciousness 05/26/2019  . Moderate mitral regurgitation by prior echocardiogram 05/04/2019  . Moderate - severe tricuspid regurgitation by prior echocardiogram 05/04/2019  . CKD stage 3 due to type 2 diabetes mellitus ([place]) 05/04/2019  . HCV antibody positive 05/04/2019  . Other cirrhosis of liver ([place]) 05/04/2019  . Fall 02/24/2019  . UTI (urinary tract infection) 02/23/2019   . AVM (arteriovenous malformation) of small bowel, acquired with hemorrhage   . Epistaxis 11/29/2018  . Altered mental status   . Heme positive stool   . Gastritis and gastroduodenitis   . Encephalopathy acute   . Acute on chronic blood loss anemia 11/20/2018  . Pressure injury of skin 10/26/2018  . Anxiety 08/15/2018  . Chronic back pain 08/15/2018  . GERD (gastroesophageal reflux disease) 08/15/2018  . History of carpal tunnel syndrome 08/15/2018  . HNP (herniated nucleus pulposus), lumbar 08/15/2018  . Hyperlipidemia 08/15/2018  . Hypertension 08/15/2018  . Insomnia 08/15/2018  . Left ventricular hypertrophy 08/15/2018  . Obesity 08/15/2018  . Sleep apnea 08/15/2018  . Anemia of chronic disease 06/20/2018  . Diabetic polyneuropathy associated with type 2 diabetes mellitus ([place]) 02/23/2018  . Moderate tricuspid regurgitation 02/18/2018  . Acute on chronic diastolic CHF (congestive heart failure) ([place]) 02/08/2018  . 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 Depression 02/05/2018  . Generalized abdominal pain 02/05/2018  . CHF (congestive heart failure) ([place]) 01/13/2018  . B12 deficiency 12/07/2017  . Iron deficiency anemia due to chronic blood loss 12/07/2017  . Folate deficiency 12/07/2017  . Upper GI bleed 11/03/2017  . COPD (chronic obstructive pulmonary disease) ([place]) 09/26/2017  . Symptomatic anemia 09/26/2017  . Anemia   . Weakness   . Lower GI bleeding 08/07/2017  . Chronic systolic heart failure ([place]) 12/28/2016  . Tobacco use 12/28/2016  . NSTEMI (non-ST elevated myocardial infarction) ([place])   . Acute hypoxemic respiratory failure ([place]) 12/18/2016  . Sepsis ([place]) 12/18/2016  . CHF exacerbation ([place]) 12/04/2015  . PNA (pneumonia) 10/28/2015  . Chronic hepatitis C without hepatic coma ([place]) 05/21/2014  . CAD (coronary artery disease) 07/19/2013  . Right arm pain 06/26/2013  . Cervical pain (neck)  03/28/2013  . Radiculopathy affecting upper extremity  03/28/2013  . Impaired renal function 12/21/2012  . Abnormal mammogram 12/20/2012  . Diabetes mellitus type 2, uncontrolled ([place]) 12/20/2012    Palliative Care Assessment & Plan   Patient Profile:  64 y.o.femalewith past medical history of chronic systolic heart failure, COPD, DM2, DOE, OSA, Hep C, other cirrhosis, CKD III, lower extremity cellulitis, lower extremity venous stasis ulcers,admitted on 6/10/2021with complaints of altered mental status. Workup reveals metabolic encephalopathy in the setting of infectious process related to lower extremity cellulitis, myositis, osteoitis. Vascular surgery consulted and recommend BKA. Spouse initially has not consented for surgery. Palliative consulted for [place].  Assessment/Recommendations/Plan   Continue current plan of care with limits of no reintubation, DNR  PMT will continue to follow- patient's status is tenuous with risk for decompensation- will continue to communicate with Thana Ates plans to visit on Thursday- he has difficulty arranging transportation due to his disability  Goals of Care and Additional Recommendations:  Limitations on Scope of Treatment: Full Scope Treatment  Code Status:  DNR  Prognosis:   Unable to determine  Discharge Planning:  To Be Determined  Care plan was discussed with Dr. Lytle Michaels.  Thank you for allowing the Palliative Medicine Team to assist in the care of this patient.   Time In: 1600 Time Out: 1645 Total Time 45 mins Prolonged Time Billed no      Greater than 50%  of this time was spent counseling and coordinating care related to the above assessment and plan.  Mariana Kaufman, AGNP-C Palliative Medicine   Please contact Palliative Medicine Team phone at [phone] for questions and concerns.

## 2019-07-09 NOTE — Progress Notes (Addendum)
Pt still bleeding from AKA surgery site. Blue chuck changed ~2hrs ago, now saturated w/ blood from site. Spoke w/ CCM MD to relay.  Per Dr. Carlis Abbott, hold heparin subq for now. Also spoke w/ Dr. Donzetta Matters, Vascular to relay who states will come to bedside to assess/  Pt also has had no UO this shift.  Bladder scan showing 211.  Relayed to CCM MD, received verbal orders for 528m bolus.

## 2019-07-09 NOTE — Progress Notes (Signed)
Pt did -30 NIF & .5L VC.

## 2019-07-10 DIAGNOSIS — D62 Acute posthemorrhagic anemia: Secondary | ICD-10-CM

## 2019-07-10 DIAGNOSIS — I959 Hypotension, unspecified: Secondary | ICD-10-CM

## 2019-07-10 LAB — GLUCOSE, CAPILLARY
Glucose-Capillary: 130 mg/dL — ABNORMAL HIGH (ref 70–99)
Glucose-Capillary: 138 mg/dL — ABNORMAL HIGH (ref 70–99)
Glucose-Capillary: 156 mg/dL — ABNORMAL HIGH (ref 70–99)
Glucose-Capillary: 150 mg/dL — ABNORMAL HIGH (ref 70–99)
Glucose-Capillary: 137 mg/dL — ABNORMAL HIGH (ref 70–99)
Glucose-Capillary: 129 mg/dL — ABNORMAL HIGH (ref 70–99)

## 2019-07-10 LAB — CBC WITH DIFFERENTIAL/PLATELET
Abs Immature Granulocytes: 0.04 10*3/uL (ref 0.00–0.07)
Basophils Absolute: 0.1 10*3/uL (ref 0.0–0.1)
Basophils Relative: 0 %
Eosinophils Absolute: 0.7 10*3/uL — ABNORMAL HIGH (ref 0.0–0.5)
Eosinophils Relative: 6 %
HCT: 18.4 % — ABNORMAL LOW (ref 36.0–46.0)
Hemoglobin: 5.2 g/dL — CL (ref 12.0–15.0)
Immature Granulocytes: 0 %
Lymphocytes Relative: 9 %
Lymphs Abs: 1.1 10*3/uL (ref 0.7–4.0)
MCH: 25.1 pg — ABNORMAL LOW (ref 26.0–34.0)
MCHC: 28.3 g/dL — ABNORMAL LOW (ref 30.0–36.0)
MCV: 88.9 fL (ref 80.0–100.0)
Monocytes Absolute: 1.2 10*3/uL — ABNORMAL HIGH (ref 0.1–1.0)
Monocytes Relative: 10 %
Neutro Abs: 8.7 10*3/uL — ABNORMAL HIGH (ref 1.7–7.7)
Neutrophils Relative %: 75 %
Platelets: 105 10*3/uL — ABNORMAL LOW (ref 150–400)
RBC: 2.07 MIL/uL — ABNORMAL LOW (ref 3.87–5.11)
RDW: 26.4 % — ABNORMAL HIGH (ref 11.5–15.5)
WBC: 11.7 10*3/uL — ABNORMAL HIGH (ref 4.0–10.5)
nRBC: 0.2 % (ref 0.0–0.2)

## 2019-07-10 LAB — CBC
HCT: 26.1 % — ABNORMAL LOW (ref 36.0–46.0)
Hemoglobin: 8.1 g/dL — ABNORMAL LOW (ref 12.0–15.0)
MCH: 27.6 pg (ref 26.0–34.0)
MCHC: 31 g/dL (ref 30.0–36.0)
MCV: 88.8 fL (ref 80.0–100.0)
Platelets: 99 10*3/uL — ABNORMAL LOW (ref 150–400)
RBC: 2.94 MIL/uL — ABNORMAL LOW (ref 3.87–5.11)
RDW: 22.2 % — ABNORMAL HIGH (ref 11.5–15.5)
WBC: 10.6 10*3/uL — ABNORMAL HIGH (ref 4.0–10.5)
nRBC: 0.3 % — ABNORMAL HIGH (ref 0.0–0.2)

## 2019-07-10 LAB — BASIC METABOLIC PANEL
Anion gap: 11 (ref 5–15)
BUN: 46 mg/dL — ABNORMAL HIGH (ref 8–23)
CO2: 21 mmol/L — ABNORMAL LOW (ref 22–32)
Calcium: 7.8 mg/dL — ABNORMAL LOW (ref 8.9–10.3)
Chloride: 117 mmol/L — ABNORMAL HIGH (ref 98–111)
Creatinine, Ser: 2.62 mg/dL — ABNORMAL HIGH (ref 0.44–1.00)
GFR calc Af Amer: 22 mL/min — ABNORMAL LOW (ref >60)
GFR calc non Af Amer: 19 mL/min — ABNORMAL LOW (ref >60)
Glucose, Bld: 135 mg/dL — ABNORMAL HIGH (ref 70–99)
Potassium: 3.8 mmol/L (ref 3.5–5.1)
Sodium: 149 mmol/L — ABNORMAL HIGH (ref 135–145)

## 2019-07-10 LAB — PROTIME-INR
INR: 1.4 — ABNORMAL HIGH (ref 0.8–1.2)
INR: 1.2 (ref 0.8–1.2)
Prothrombin Time: 16.2 seconds — ABNORMAL HIGH (ref 11.4–15.2)
Prothrombin Time: 15 seconds (ref 11.4–15.2)

## 2019-07-10 LAB — PREPARE RBC (CROSSMATCH)

## 2019-07-10 LAB — PHOSPHORUS: Phosphorus: 2.4 mg/dL — ABNORMAL LOW (ref 2.5–4.6)

## 2019-07-10 LAB — APTT: aPTT: 29 seconds (ref 24–36)

## 2019-07-10 MED ORDER — SODIUM CHLORIDE 0.9% IV SOLUTION: Freq: Once | INTRAVENOUS | Status: DC

## 2019-07-10 MED ORDER — VITAL 1.5 CAL PO LIQD
1000.0000 mL | ORAL | Status: DC
  Administered 2019-07-10: 1000 mL
  Filled 2019-07-10 (×2): qty 1000

## 2019-07-10 MED ORDER — PRO-STAT SUGAR FREE PO LIQD
30.0000 mL | Freq: Every day | ORAL | Status: DC
  Administered 2019-07-10 – 2019-07-11 (×2): 30 mL
  Filled 2019-07-10 (×2): qty 30

## 2019-07-10 MED ORDER — ORAL CARE MOUTH RINSE
15.0000 mL | Freq: Two times a day (BID) | OROMUCOSAL | Status: DC
  Administered 2019-07-10 – 2019-07-26 (×24): 15 mL via OROMUCOSAL

## 2019-07-10 NOTE — TOC Progression Note (Signed)
Transition of Care [facility]) - Progression Note    Patient Details  Name: [name] MRN: [id number] Date of Birth: 1955-11-03  Transition of Care [facility]) CM/SW Contact  Oren Section Cleta Alberts, RN Phone Number: 07/10/2019, 3:40 PM  Clinical Narrative:   [facility] Case Manager acknowledges consult requesting assistance with possible Medicare benefits.  I spoke with pt's husband, Kela Millin, and he states that social security office has told him that pt would be eligible for Medicare as soon as September, but he is wondering why she can't get it now, as she is totally disabled.  I will follow up with [employer] (Financial counseling) and have them follow up with husband.      Expected Discharge Plan: Long Term Nursing Home Barriers to Discharge: Continued Medical Work up  Expected Discharge Plan and Services Expected Discharge Plan: [place]       Living arrangements for the past 2 months: [place] Expected Discharge Date:  (unknown)                                     Social Determinants of Health (SDOH) Interventions    Readmission Risk Interventions Readmission Risk Prevention Plan 05/28/2019 02/26/2019 12/17/2018  Transportation Screening Complete Complete Complete  PCP or Specialist Appt within 5-7 Days - - -  Home Care Screening - - -  Medication Review (RN CM) - - -  HRI or [place] Work Consult for [place] Planning/Counseling - - -  SW consult not completed comments - - -  Palliative Care Screening - - -  Medication Review Press photographer) Complete Complete Referral to Pharmacy  PCP or Specialist appointment within 3-5 days of discharge - (No Data) Not Complete  PCP/Specialist Appt Not Complete comments - - DC date unknown but pt is established with providers  [place] or [place] Complete Complete Not Complete  HRI or Home Care Consult Pt Refusal Comments - - SNF resident  SW Recovery Care/Counseling Consult  Complete - Complete  Palliative Care Screening Not Applicable Not Applicable Not Complete  Comments - - pending need  Skilled Nursing Facility Complete Patient Refused Complete  Some recent data might be hidden   Reinaldo Raddle, RN, BSN  Trauma/Neuro ICU Case Manager [phone]

## 2019-07-10 NOTE — Progress Notes (Signed)
   I was called to bedside for persistent bleeding from medial aspect of right above-knee amputation wound.  Upon arrival there was significant heme on patient's dressing in the bed.  I removed 4 staples I packed the wound with Surgicel placed 2 figure-of-eight sutures with 2-0 nylon suture and applied Dermabond above this.  Wound appeared to be hemostatic.  Patient is to be transferred 3 units packed red blood cells.  Will reevaluate wound in a few hours.  Okay to leave open to air for now.  Fronnie Urton C. Donzetta Matters, MD [facility] [place] Office: [phone] Pager: [phone]

## 2019-07-10 NOTE — Progress Notes (Signed)
Attempted to see pt this am. Pt with bleeding event overnight and waiting on blood. Nursing asked to decline today due to low Hgb. Will attempt back as schedule allows.  Jinger Neighbors, [phone]

## 2019-07-10 NOTE — Progress Notes (Signed)
[facility] Progress Note Patient Name: [name] DOB: 05-15-1955 MRN: [id number]   Date of Service  07/10/2019  HPI/Events of Note  Multiple issues: 1. Anemia - Hgb = 5.2 and 2. Coagulopathy - INR = 1.4   eICU Interventions  Plan: 1. Transfuse 3 units PRBC now.  2. Transfuse 2 units FFP now.  3. Bedside nurse to notify surgeon of Hgb = 5.2.     Intervention Category Major Interventions: Other:  Lysle Dingwall 07/10/2019, 4:34 AM

## 2019-07-10 NOTE — Progress Notes (Signed)
Nutrition Follow-up  RD working remotely.  DOCUMENTATION CODES:   Obesity unspecified  INTERVENTION:   Tube feeding via gastric Cortrak: - Change to Vital 1.5 @ 55 ml/hr (1320 ml/day) - Pro-stat 30 ml daily - Free water per CCM, currently 300 ml q 4 hours  Tube feeding regimen and current free water provides 2080 kcal, 104 grams of protein, and 2808 ml of H2O.   NUTRITION DIAGNOSIS:   Increased nutrient needs related to wound healing as evidenced by estimated needs.  Ongoing  GOAL:   Patient will meet greater than or equal to 90% of their needs  Met via TF  MONITOR:   Diet advancement, Labs, Weight trends, TF tolerance, Skin, I & O's  REASON FOR ASSESSMENT:   Consult Enteral/tube feeding initiation and management  ASSESSMENT:   Patient with PMH significant for CHF, NSTEMI, COPD, DM, gastric ulcer, chronic hepatitis C, cirrhosis of liver, and CKD III. Presents this admission with acute exacerbation CHF and bilateral lower extremity wounds.  6/17 - s/p R AKA 6/18 - Cortrak placed, tip gastric 6/19 - pt vomited TF and bile, TF stopped 6/20 - TF restarted 6/22 - extubated  Hemoglobin 5.2 this morning and 3 PRBC and 2 FFP ordered.  Pt remains NPO after extubation.  Current TF regimen: Vital High Protein @ 55 ml/hr, free water 300 ml q 4 hours  Current weight: 100 kg Admit weight: 115 kg  RD will adjust tube feeding regimen now that pt has been extubated.  Medications reviewed and include: colace, folic acid, SSI q 4 hours, lactulose, MVI with minerals, protonix  Labs reviewed: sodium 149, phosphorus 2.4 CBG's: 115-156 x 24 hours  UOP: 500 ml x 24 hours  Diet Order:   Diet Order            Diet NPO time specified  Diet effective now                 EDUCATION NEEDS:   Not appropriate for education at this time  Skin:  Skin Assessment: Skin Integrity Issues: Skin Integrity Issues: Stage II: right thigh, left thigh Incisions: right  knee Other: wound to sacrum  Last BM:  07/10/19 large type 7 via rectal tube  Height:   Ht Readings from Last 1 Encounters:  06/28/19 5' 5"  (1.651 m)    Weight:   Wt Readings from Last 1 Encounters:  07/10/19 100 kg    Ideal Body Weight:  52.3 kg (adjusted for AKA)  BMI:  Body mass index is 36.69 kg/m.  Estimated Nutritional Needs:   Kcal:  1900-2100  Protein:  100-115 grams  Fluid:  >/= 1.9 L/day    Gaynell Face, MS, RD, LDN Inpatient Clinical Dietitian Pager: [phone] Weekend/After Hours: [phone]

## 2019-07-10 NOTE — Progress Notes (Signed)
Only IV access was lost around 0815. This RN, another Designer, multimedia unable to visualize or feel possible vein to stick for IV access. IV team consulted. Will restart plasma once IV access is regained. Will continue to monitor.

## 2019-07-10 NOTE — Progress Notes (Signed)
[facility] Progress Note Patient Name: [name] DOB: 1956-01-10 MRN: [id number]   Date of Service  07/10/2019  HPI/Events of Note  Bleeding from R AKA stump.   eICU Interventions  Plan: 1. D/C Heparin [place]. 2. SCD to LLE.  3. CBC with platelets, PT/INR and PTT STAT.  4. Please notify surgeon on-call for vascular surgery of bleeding from stump.     Intervention Category Major Interventions: Other:  Lysle Dingwall 07/10/2019, 1:33 AM

## 2019-07-10 NOTE — Progress Notes (Signed)
Rectal tube not working at this time.  Cleaned loose stool away from patient twice thusfar.  Noted that right aka is actively bleeding medially from the approximated surgical  Line.  Some large clots noted in dressing, needed to replace dressing as it was full of stool.  Active bleeding noted.  Area under skin around bleed is hard and painful to the touch. Fresh pressure dressing applied.  Vascular paged at 0200.  Awaiting response

## 2019-07-10 NOTE — Progress Notes (Signed)
This note also relates to the following rows which could not be included: Pulse Rate - Cannot attach notes to unvalidated device data ECG Heart Rate - Cannot attach notes to unvalidated device data Resp - Cannot attach notes to unvalidated device data SpO2 - Cannot attach notes to unvalidated device data  Blood transfusion had to be stopped because it had been 4 hours

## 2019-07-10 NOTE — Progress Notes (Signed)
Pt has had no urine output. MD notified. No new orders at this time.

## 2019-07-10 NOTE — Progress Notes (Signed)
PT Cancellation Note  Patient Details Name: JAI BEAR MRN: [id number] DOB: [birth year]   Cancelled Treatment:    Reason Eval/Treat Not Completed: Medical issues which prohibited therapy.  Hgb is 5.2, will hold today. 07/10/2019  Ginger Carne., PT Acute Rehabilitation Services [phone]  (pager) [phone]  (office)   Tessie Fass Caylee Vlachos 07/10/2019, 11:09 AM

## 2019-07-10 NOTE — Progress Notes (Signed)
NAME:  [name], MRN:  [id number], DOB:  19-Oct-1955, LOS: 35 ADMISSION DATE:  06/27/2019, CONSULTATION DATE:  07/04/19 REFERRING MD:  Dr. Carlis Abbott, CHIEF COMPLAINT:  Post-op respiratory failure  Brief History   [name] is a 64yo f with a PMHx of diastolic CHF with anasarca, NSTEMI, COPD, DMII uncontrolled, anemia, CKD IIIB, hepatitis C with cirrhosis who presented for altered mental status. 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 She was unable to be extubated following surgery. CCM was consulted for ventilator management.  History of present illness   [name] is a 64yo f with a PMHx of diastolic CHF with anasarca, NSTEMI, COPD, DMII uncontrolled, anemia, CKD IIIB, hepatitis C with cirrhosis who presented to [facility] ED from [facility] facility for altered mental status. Per chart review, patient's nurse at the nursing facility had noted that she had been moaning and groaning without coherent conversation, though at baseline she is oriented and able to converse normally. Pt was also noted to be hypoxic to 58% while at nursing facility. She does not normally wear O2 at home. While at [facility], pt also developed desaturations and was placed and maintained on BiPAP with improvement in hypoxia. She developed worsening lethargy and unresponsiveness on 6/14 that required her to be placed on BiPAP again. Pt also underwent MRI that showed R foot abscess/myositis/tenosynovitis as well as L foot myositis and leg cellulitis. Not thought to be osteomyelitis. Vascular surgery was consulted and recommended right above knee amputation and left angiography. Patient's husband had refused amputation and consented to have R AKA on 6/17. Patient was transferred to [facility] for the procedure. She underwent AKA on 0/76 without complication. Extubation was attempted in the OR including with pressure support  but patient was unable to generate adequate volumes. She also was unable to be weaned while at PACU.  Notably, patient was admitted at [facility] between 5/9-5/20/21 for Group c strep bacteremia and bilateral leg edema, as well as R leg superficial wounds and cellulitis. Per discharge summary, patient's mental status was impaired and thought to be due to sepsis. During this hospitalization, she had a TEE (05/30/19) and prosthetic valve endocarditis was ruled out. Patient had also completed a course of Augmentin for bilateral lower extremity ulcers/cellulitis on June 8th.    Past Medical History  Diastolic CHF Chronic anemia COPD Diabetes mellitus II  Gastric ulcer NSTEMI CKD IIIB Hepatitis C with cirrhosis Chronic leg venous ulcers  Significant Hospital Events   6/10 > Admitted to [facility] 6/12 > Transferred to [facility] from [place] 6/13 > rapid response called at [facility] due to worsening lethargy. Placed on BiPAP 6/17> R AKA 6/22 extubated  Consults:  Palliative care  Vascular surgery  Procedures:  6/17 > R leg AKA 6/17-22 ETT  Significant Diagnostic Tests:   CXR 6/10 > increasing CHF with R basilar atelectasis.   MRI L tibia/fibula 6/10 > nonspecific subcutaneous edema throughout RLL. No evidence of osteomyelitis. Mild diffuse intramuscular edema, myositis vs. Denervation changes  MRI R foot 6/10 > no evidence of acute osteomyelitis. Ill-defined multilobulated fluid collections at dorsal proximal foot and along dorsal and medial foot concerning for developing abscesses. Diffuse edema suggesting myositis. Mild extensor digitorum longus tenosynovitis  MRI L foot 6/10 > significantly limited exam, pt unable to complete full exam. Interval amputation of L great toe at first MTP joint. Findings c/w reactive  osteitis. Early acute osteomyelitis thought to be unlikely. Nonspecific myositis.  MRI L tibia/fibula 6/11 > negative for osteomyelitis or myositis. Diffuse  subcutaneous edema, possible cellulitis. No subcutaneous abscess though limited exam.  MRI Head 6/13 > no acute intracranial abnormality, chronic ischemic microangiopathy  CXR 6/20: bilateral effusions no significant air space disease.   Micro Data:  Ucx 6/10 > neg Blood cx 6/10 > 1/2 diptheroids COVID 6/10 > neg MRSA PCR 6/17 > positive  Antimicrobials:  Cefepime 6/10  > Vancomycin 6/10 >  Interim history/subjective:   3 PRBC 2 FFP ordered for Hgb 5.2, INR 1.4   Pt has lost IV this morning and attempts are being made to replace   Objective   Blood pressure (!) 118/94, pulse 93, temperature 97.6 F (36.4 C), temperature source Oral, resp. rate 18, height 5' 5"  (1.651 m), weight 100 kg, SpO2 (!) 83 %.    FiO2 (%):  [40 %] 40 %   Intake/Output Summary (Last 24 hours) at 07/10/2019 0927 Last data filed at 07/10/2019 0830 Gross per 24 hour  Intake 1970.36 ml  Output 501 ml  Net 1469.36 ml   Filed Weights   07/08/19 0500 07/09/19 0440 07/10/19 0500  Weight: 96 kg 99.4 kg 100 kg    Examination: General: Chronically ill appearing older adult F, reclined in bed NAD  HENT: NCAT cortrak secure. Pink mm.  Lungs: Symmetrical chest expansion. L sided rhonchi. No accessory muscle use  Cardiovascular: RRR s1s2 no rgm  Abdomen: obese, soft, NT, ND Extremities: R BKA, incision c/d/i open to air. Amputated L first toe. No LLE edema. Neuro: Awake alert following commands. Oriented x2   Skin: LUE ecchymosis. C/d/w without rash   Resolved Hospital Problem list   Acute hypoxic respiratory failure requiring mechanical ventilation   Assessment & Plan:   Acute hypoxic respiratory failure requiring mechanical ventilation, improved - was requiring BiPAP pre-op   COPD Sleep apnea Extubated to bipap 6/22.  P -Supplemental O2 for SpO2 goal 88-92% -PRN and QHS BiPAP  -As per Palliative and PCCM prior notes, no re-intubation  Status post right AKA for chronic foot ischemia ABLA from  surgery, ongoing blood loss in ICU 6/23 Hgb 5.2  P -3 PRBC and 2 FFP ordered 6/23; awaiting IV access  -Post-op per vascular surgery   Acute metabolic encephalopathy-likely due to acute illness, sedation, ICU delirium, chronic liver and kidney disease P -Delirium precautions, promote sleep hygiene  -correct metabolic abnormalities as able   Chronic HCV, carries diagnosis of probable cirrhosis, imaging is not confirmatory  -Continue lactulose  AKI on CKD 3b, worsening -Check BMP 6/23, daily  -Renally dose meds and avoid nephrotoxic meds  Hypernatremia   -Continue FWF -Awaiting BMP 6/23   Type 2 diabetes; BG at goal -Continue sliding scale insulin as needed -Goal BG 140-180 while admitted to the ICU  Chronic debility; resident of SNF PTA -Appreciate palliative care's input.  -We will need PT, OT, SLP post extubation   Daily Goals Checklist  Pain/Anxiety/Delirium protocol (if indicated): Delirium precautions  VAP protocol (if indicated): na  Respiratory support goals: Nocturnal and PRN NPPV  Blood pressure target: MAP >65 currently on no support. DVT prophylaxis: on hold due to bleeding Nutrition Status: tube feeds via Cortrak - moderate nutritional risk  GI prophylaxis: Protonix Glucose control: T2 DM currently euglycemic on SSI Mobility/therapy needs: baseline bedbound, appreciate PT/OT Daily labs: CBC, BMP Code Status: DNR/I Family Communication: pending 6/23  Disposition: ICU at present-- Anticipate we are approaching  readiness to transfer out of ICU   Labs   CBC: Recent Labs  Lab 07/04/19 1756 07/05/19 0759 07/06/19 0600 07/08/19 1108 07/10/19 0257  WBC 12.4* 8.3 11.5* 9.9 11.7*  NEUTROABS  --   --   --   --  8.7*  HGB 5.5* 6.7* 8.3* 7.3* 5.2*  HCT 20.6* 23.4* 27.5* 25.4* 18.4*  MCV 84.4 81.0 81.8 86.1 88.9  PLT 133* 125* 108* 106* 105*    Basic Metabolic Panel: Recent Labs  Lab 07/04/19 1636 07/05/19 0759 07/06/19 0600 07/08/19 1108  07/09/19 0947 07/10/19 0257  NA 146* 146* 146* 148* 149*  --   K 4.7 4.7 4.1 3.4* 3.7  --   CL 117* 116* 115* 116* 119*  --   CO2 21* 22 22 24 22   --   GLUCOSE 124* 138* 160* 166* 204*  --   BUN 34* 37* 42* 46* 45*  --   CREATININE 1.92* 2.10* 2.29* 2.70* 2.62*  --   CALCIUM 8.1* 8.1* 8.2* 8.2* 7.9*  --   MG  --   --   --   --  1.7  --   PHOS  --   --   --   --  1.9* 2.4*   GFR: Estimated Creatinine Clearance: 25.4 mL/min (A) (by C-G formula based on SCr of 2.62 mg/dL (H)). Recent Labs  Lab 07/05/19 0759 07/06/19 0600 07/08/19 1108 07/10/19 0257  WBC 8.3 11.5* 9.9 11.7*    Liver Function Tests: Recent Labs  Lab 07/04/19 1636 07/05/19 0759 07/08/19 1108 07/09/19 0947  AST 14* 16 14* 11*  ALT 8 10 9 9   ALKPHOS 109 101 110 90  BILITOT 0.8 1.7* 0.9 1.0  PROT 6.8 7.0 7.7 7.0  ALBUMIN 1.6* 1.7* 1.6* 1.5*   No results for input(s): LIPASE, AMYLASE in the last 168 hours. Recent Labs  Lab 07/04/19 1636 07/05/19 1213  AMMONIA 21 20    ABG    Component Value Date/Time   PHART 7.388 07/04/2019 1400   PCO2ART 34.5 07/04/2019 1400   PO2ART 107 07/04/2019 1400   HCO3 20.4 07/04/2019 1400   TCO2 28 09/29/2006 1033   ACIDBASEDEF 3.8 (H) 07/04/2019 1400   O2SAT 98.3 07/04/2019 1400     Coagulation Profile: Recent Labs  Lab 07/10/19 0257  INR 1.4*    Cardiac Enzymes: No results for input(s): CKTOTAL, CKMB, CKMBINDEX, TROPONINI in the last 168 hours.  HbA1C: Hgb A1c MFr Bld  Date/Time Value Ref Range Status  05/27/2019 05:30 AM 7.4 (H) 4.8 - 5.6 % Final    Comment:    (NOTE) Pre diabetes:          5.7%-6.4% Diabetes:              >6.4% Glycemic control for   <7.0% adults with diabetes   05/08/2019 05:08 AM 6.8 (H) 4.8 - 5.6 % Final    Comment:    (NOTE) Pre diabetes:          5.7%-6.4% Diabetes:              >6.4% Glycemic control for   <7.0% adults with diabetes     CBG: Recent Labs  Lab 07/09/19 1516 07/09/19 1933 07/09/19 2322  07/10/19 0346 07/10/19 0717  GLUCAP 134* 152* 115* Gower MSN, AGACNP-BC [place] [phone] If no answer, [phone] 07/10/2019, 9:51 AM

## 2019-07-10 NOTE — Progress Notes (Addendum)
Daily Progress Note   Patient Name: [name]       Date: 07/10/2019 DOB: 06-15-1955  Age: 64 y.o. MRN#: [id number] Attending Physician: Julian Hy, DO Primary Care Physician: [place] Date: 06/27/2019  Reason for Consultation/Follow-up: Establishing goals of care  Subjective: Patient lethargic. Nonverbal. Noted decreased urine output, bleeding.  I called Kela Millin and gave updates. Discussed concerns regarding Ommie's mental status not significantly improving from prior to surgery as well as watching her kidney function. Additional concerns about her ability for oral intake.  Kela Millin understandably concerned. He is planning to visit tomorrow and agrees to followup discussion regarding goals of care.   ROS  Length of Stay: 13  Current Medications: Scheduled Meds:  . sodium chloride   Intravenous Once  . atorvastatin  40 mg Per Tube Daily  . Chlorhexidine Gluconate Cloth  6 each Topical Daily  . docusate  100 mg Per Tube Daily  . feeding supplement (PRO-STAT SUGAR FREE 64)  30 mL Per Tube Daily  . folic acid  1 mg Per Tube Daily  . free water  300 mL Per Tube Q4H  . hydrocortisone cream   Topical BID  . insulin aspart  0-15 Units Subcutaneous Q4H  . lactulose  20 g Per Tube TID  . mouth rinse  15 mL Mouth Rinse BID  . multivitamin with minerals  1 tablet Per Tube Daily  . mupirocin ointment   Nasal BID  . pantoprazole sodium  40 mg Per Tube Daily    Continuous Infusions: . sodium chloride 10 mL/hr at 07/10/19 2200  . feeding supplement (VITAL 1.5 CAL) 55 mL/hr at 07/10/19 1800    PRN Meds: sodium chloride, acetaminophen **OR** acetaminophen  Physical Exam          Vital Signs: BP (!) 131/59   Pulse 84   Temp 97.8 F (36.6 C) (Oral)   Resp 15    Ht 5' 5"  (1.651 m)   Wt 100 kg   SpO2 99%   BMI 36.69 kg/m  SpO2: SpO2: 99 % O2 Device: O2 Device: Nasal Cannula O2 Flow Rate: O2 Flow Rate (L/min): 2 L/min  Intake/output summary:   Intake/Output Summary (Last 24 hours) at 07/10/2019 2239 Last data filed at 07/10/2019 2200 Gross per 24 hour  Intake 4281.32 ml  Output 701 ml  Net 3580.32 ml   LBM: Last BM Date: 07/10/19 (rectal tube) Baseline Weight: Weight: 115 kg Most recent weight: Weight: 100 kg       Palliative Assessment/Data: PPS: 10%    Flowsheet Rows     Most Recent Value  Intake Tab  Referral Department Hospitalist  Unit at Time of Referral Cardiac/Telemetry Unit  Date Notified 07/01/19  Palliative Care Type Return patient Palliative Care  Reason for referral Clarify Goals of Care  Date of Admission 06/27/19  Date first seen by Palliative Care 07/01/19  # of days Palliative referral response time 0 Day(s)  # of days IP prior to Palliative referral 4  Clinical Assessment  Psychosocial & Spiritual Assessment  Palliative Care Outcomes      Patient Active Problem List   Diagnosis Date Noted  . Palliative care encounter   . DNR (do not resuscitate)   . Respiratory acidosis   . Hyperkalemia   . Advanced care planning/counseling discussion   . Chronic systolic CHF (congestive heart failure) ([place]) 06/27/2019  . Venous stasis ulcer ([place]) 06/27/2019  . Type 2 diabetes mellitus with stage 3b chronic kidney disease, with long-term current use of insulin ([place])   . DNR (do not resuscitate) discussion   . Acute metabolic encephalopathy   . Hypotension   . Acute kidney injury superimposed on CKD ([place])   . Goals of care, counseling/discussion   . Palliative care by specialist   . Anasarca 05/26/2019  . Cellulitis 05/26/2019  . Diabetic foot ulcer ([place]) 05/26/2019  . Decreased level of consciousness 05/26/2019  . Moderate mitral regurgitation by prior echocardiogram 05/04/2019  . Moderate - severe tricuspid  regurgitation by prior echocardiogram 05/04/2019  . CKD stage 3 due to type 2 diabetes mellitus ([place]) 05/04/2019  . HCV antibody positive 05/04/2019  . Other cirrhosis of liver ([place]) 05/04/2019  . Fall 02/24/2019  . UTI (urinary tract infection) 02/23/2019  . AVM (arteriovenous malformation) of small bowel, acquired with hemorrhage   . Epistaxis 11/29/2018  . Altered mental status   . Heme positive stool   . Gastritis and gastroduodenitis   . Encephalopathy acute   . Acute on chronic blood loss anemia 11/20/2018  . Pressure injury of skin 10/26/2018  . Anxiety 08/15/2018  . Chronic back pain 08/15/2018  . GERD (gastroesophageal reflux disease) 08/15/2018  . History of carpal tunnel syndrome 08/15/2018  . HNP (herniated nucleus pulposus), lumbar 08/15/2018  . Hyperlipidemia 08/15/2018  . Hypertension 08/15/2018  . Insomnia 08/15/2018  . Left ventricular hypertrophy 08/15/2018  . Obesity 08/15/2018  . Sleep apnea 08/15/2018  . Anemia of chronic disease 06/20/2018  . Diabetic polyneuropathy associated with type 2 diabetes mellitus ([place]) 02/23/2018  . Moderate tricuspid regurgitation 02/18/2018  . Acute on chronic diastolic CHF (congestive heart failure) ([place]) 02/08/2018  . 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 Depression 02/05/2018  . Generalized abdominal pain 02/05/2018  . CHF (congestive heart failure) ([place]) 01/13/2018  . B12 deficiency 12/07/2017  . Iron deficiency anemia due to chronic blood loss 12/07/2017  . Folate deficiency 12/07/2017  . Upper GI bleed 11/03/2017  . COPD (chronic obstructive pulmonary disease) ([place]) 09/26/2017  . Symptomatic anemia 09/26/2017  . Anemia   . Weakness   . Lower GI bleeding 08/07/2017  . Chronic systolic heart failure ([place]) 12/28/2016  . Tobacco use 12/28/2016  . NSTEMI (non-ST elevated myocardial infarction) ([place])   . Acute hypoxemic respiratory failure ([place]) 12/18/2016  . Sepsis ([place]) 12/18/2016  .  CHF exacerbation ([place])  12/04/2015  . PNA (pneumonia) 10/28/2015  . Chronic hepatitis C without hepatic coma ([place]) 05/21/2014  . CAD (coronary artery disease) 07/19/2013  . Right arm pain 06/26/2013  . Cervical pain (neck) 03/28/2013  . Radiculopathy affecting upper extremity 03/28/2013  . Impaired renal function 12/21/2012  . Abnormal mammogram 12/20/2012  . Diabetes mellitus type 2, uncontrolled ([place]) 12/20/2012    Palliative Care Assessment & Plan   Patient Profile: 64 y.o.femalewith past medical history of chronic systolic heart failure, COPD, DM2, DOE, OSA, Hep C, other cirrhosis, CKD III, lower extremity cellulitis, lower extremity venous stasis ulcers,admitted on 6/10/2021with complaints of altered mental status. Workup reveals metabolic encephalopathy in the setting of infectious process related to lower extremity cellulitis, myositis, osteoitis. Vascular surgery consulted and recommend BKA. Spouse initially has not consented for surgery. Palliative consulted for [place].  Assessment/Recommendations/Plan   Continue current scope of care  Followup meeting with patient's spouse planned for tomorrow if he can arrange transportation  Goals of Care and Additional Recommendations:  Limitations on Scope of Treatment: Full Scope Treatment  Code Status:  DNR  Prognosis:   Unable to determine  Discharge Planning:  To Be Determined  Care plan was discussed with patient's spouse  Thank you for allowing the Palliative Medicine Team to assist in the care of this patient.   Time In: 1530 Time Out: 1610 Total Time 40 mins Prolonged Time Billed no      Greater than 50%  of this time was spent counseling and coordinating care related to the above assessment and plan.  Mariana Kaufman, AGNP-C Palliative Medicine   Please contact Palliative Medicine Team phone at [phone] for questions and concerns.

## 2019-07-10 NOTE — Progress Notes (Cosign Needed Addendum)
FOR EDUCATIONAL USE ONLY, NOT OFFICIAL PART OF MEDICAL RECORD   NAME:  [name], MRN:  [id number], DOB:  [birth year], LOS: 65 ADMISSION DATE:  06/27/2019, CONSULTATION DATE:  07/04/19 REFERRING MD:  Carlis Abbott, CHIEF COMPLAINT:  Respiratory failure   Brief History   64 year old female with a PMH significant for HFpEF, NSTEMI, COPD, uncontrolled DM II, anemia, CKD III, and hepatitis C with cirrhosis. 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 She was unable to be extubated following surgery and was admitted to the ICU. PCCM was consulted for ventilator management.   History of present illness   [name] is a 64 year old female with a PMH significant for HFpEF, NSTEMI, COPD, uncontrolled DM II, anemia, CKD III, and hepatitis C with cirrhosis who presented to [facility] ED from [place] facility for AMS. She was moaning and groaning without coherent conversation at her nursing facility and was noted to be hypoxic with oxygen saturations of 58%. She was placed on BiPAP at [facility] with improvement of her hypoxia. While at [facility], she underwent MRI which showed right foot abscess/myositis/tenosynovitis as well as left foot myositis and cellulitis. Vascular surgery was consulted and she was transferred to [facility] for right AKA and left angiography. 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 She was unable to generate adequate volumes to be extubated in the OR and weaned from MV.  Past Medical History  HFpEF NSTEMI COPD Uncontrolled DM II Anemia CKD III Hepatitis C Cirrhosis Chronic venous leg ulcers  Significant Hospital Events   6/10 Admit to [facility] 6/13 Worsening lethargy and placed in BiPAP 6/17 Right AKA 6/22 Extubated Consults:  Palliative Vascular  Procedures:  6/17 Right leg AKA  Significant Diagnostic Tests:  6/20 CXR > Cardiomegaly and edema. Left base opacification - effusion vs atelectasis.   6/17 CT head >  Chronic changes. No acute abnormalities  6/10 MRI Left tib/fib > nonspecific subcutaneous edema throughout RLL. No evidence of osteomyelitis. Mild diffuse intramuscular edema, myositis vs. Denervation changes  6/10 MRI right foot> no evidence of acute osteomyelitis. Ill-defined multilobulated fluid collections at dorsal proximal foot and along dorsal and medial foot concerning for developing abscesses. Diffuse edema suggesting myositis. Mild extensor digitorum longus tenosynovitis  6/10 MRI Left foot > significantly limited exam, pt unable to complete full exam. Interval amputation of L great toe at first MTP joint. Findings c/w reactive osteitis. Early acute osteomyelitis thought to be unlikely. Nonspecific myositis.  Micro Data:  6/17 MRSA PCR > Positive 6/10 Urine Culture > Negative 6/10 Blood cultures > 1/4 bottles diphtheroids   Antimicrobials:  Cefepime 6/10 >> 6/17 Vancomycin 6/10 >> 6/17  Interim history/subjective:  Palliative care consulted and family continues to wish for aggressive care but she was made DNR and will not be re-intubated. Bleeding noted from right AKA stump overnight. Heparin SQ was discontinued. Noted to have Hgb 5.2 and INR 1.4. Received 3 units PRBC and 2 FFP overnight. Vascular attended to bedside and removed 4 staples, packed, and sutured area of wound for hemostasis.   Objective   Blood pressure (!) 118/94, pulse 93, temperature 97.6 F (36.4 C), temperature source Oral, resp. rate 18, height 5' 5"  (1.651 m), weight 100 kg, SpO2 (!) 83 %.    FiO2 (%):  [40 %] 40 %   Intake/Output Summary (Last 24 hours) at 07/10/2019 0924 Last data filed at 07/10/2019 0830 Gross per 24 hour  Intake 1970.36 ml  Output 501 ml  Net 1469.36 ml   Filed Weights   07/08/19 0500 07/09/19 0440 07/10/19 0500  Weight: 96 kg 99.4 kg 100 kg    Examination: General: African american female laying in bed in no obvious distress with eyes open HENT: No scleral icterus. MMM.  Cortrak in place left nare infusing TF.  Lungs: Lungs CTAB. No accessory muscle use. Equal chest rise and fall.  Cardiovascular: Normal S1/S2. No murmurs or rubs. NSR on monitor Abdomen: Soft and non-tender. Not distended Extremities: Right AKA noted. Right thigh compartments soft and compressible. No bleeding noted from staple line. No obvious joint deformities. Neuro: Eyes open spontaneously. Follows commands. Converses and answers questions appropriately. Oriented to place and self. Disoriented to time. GU: Foley catheter in place.   Resolved Hospital Problem list    Assessment & Plan:  Metabolic encephalopathy Chronic Hepatitis C, possible cirrhosis Many derangements including hypernatremia, hypoxia, hepatic and renal dysfunction. -Continue lactulose -Check ammonia -Normalize derangements as detailed below -Sleep hygiene, minimize stimulation  Acute hypoxic respiratory failure OSA Weaned from MV and extubated yesterday, 6/22. DNR/DNI. -Nocturnal and PRN NIPPV -Maintain SpO2 > 92% -Wean O2 as tolerated  HFpEF Echo 6/10 EF 40-45% with grade III diastolic dysfunction. On Lasix at home -Would continue holding off on diuresis at this time given ABLA -Resume diuretic after blood transfusion  Acute on chronic renal failure, stage III -Avoid nephrotoxic meds -Trend renal indices, BMP today -No indication for dialysis  Chronic wounds of right lower extremity, s/p right AKA -Managed by vascular surgery  Acute blood loss anemia secondary to right AKA bleeding stump Coagulopathy, thrombocytopenia Bleeding from wound overnight, vascular packed and sutured area of wound. Anticoagulation discontinued. -Holding anticoagulation due to bleeding -3 PRBC, 2 FFP ordered -Recheck CBC post-transfusion -Monitor for signs and symptoms of anemia. No obvious bleeding at this time.   Hypernatremia Worsening, 149 yesterday 6/22.  -Recheck today and trend daily -Will calculate FWD and  increase FWF pending today's Na  DM II, uncontrolled HgbA1c 7.4 5/10. Euglycemic on SSI -Continue SSI -Goal BG 140-157m/dL  Best practice:  Diet: TF via cortrak Pain/Anxiety/Delirium protocol (if indicated): N/A VAP protocol (if indicated): Yes DVT prophylaxis: SCD - Bleeding overnight, heparin SQ d/c GI prophylaxis: PPI Glucose control: DM II - Euglycemic on SSI Mobility: Bedrest Code Status: DNR/DNI Family Communication: Pending Disposition: ICU  Labs   CBC: Recent Labs  Lab 07/04/19 1756 07/05/19 0759 07/06/19 0600 07/08/19 1108 07/10/19 0257  WBC 12.4* 8.3 11.5* 9.9 11.7*  NEUTROABS  --   --   --   --  8.7*  HGB 5.5* 6.7* 8.3* 7.3* 5.2*  HCT 20.6* 23.4* 27.5* 25.4* 18.4*  MCV 84.4 81.0 81.8 86.1 88.9  PLT 133* 125* 108* 106* 105*    Basic Metabolic Panel: Recent Labs  Lab 07/04/19 1636 07/05/19 0759 07/06/19 0600 07/08/19 1108 07/09/19 0947 07/10/19 0257  NA 146* 146* 146* 148* 149*  --   K 4.7 4.7 4.1 3.4* 3.7  --   CL 117* 116* 115* 116* 119*  --   CO2 21* 22 22 24 22   --   GLUCOSE 124* 138* 160* 166* 204*  --   BUN 34* 37* 42* 46* 45*  --   CREATININE 1.92* 2.10* 2.29* 2.70* 2.62*  --   CALCIUM 8.1* 8.1* 8.2* 8.2* 7.9*  --   MG  --   --   --   --  1.7  --   PHOS  --   --   --   --  1.9* 2.4*   GFR: Estimated Creatinine Clearance: 25.4 mL/min (A) (by C-G formula based on SCr of 2.62 mg/dL (H)). Recent Labs  Lab 07/05/19 0759 07/06/19 0600 07/08/19 1108 07/10/19 0257  WBC 8.3 11.5* 9.9 11.7*    Liver Function Tests: Recent Labs  Lab 07/04/19 1636 07/05/19 0759 07/08/19 1108 07/09/19 0947  AST 14* 16 14* 11*  ALT 8 10 9 9   ALKPHOS 109 101 110 90  BILITOT 0.8 1.7* 0.9 1.0  PROT 6.8 7.0 7.7 7.0  ALBUMIN 1.6* 1.7* 1.6* 1.5*   No results for input(s): LIPASE, AMYLASE in the last 168 hours. Recent Labs  Lab 07/04/19 1636 07/05/19 1213  AMMONIA 21 20    ABG    Component Value Date/Time   PHART 7.388 07/04/2019 1400   PCO2ART  34.5 07/04/2019 1400   PO2ART 107 07/04/2019 1400   HCO3 20.4 07/04/2019 1400   TCO2 28 09/29/2006 1033   ACIDBASEDEF 3.8 (H) 07/04/2019 1400   O2SAT 98.3 07/04/2019 1400     Coagulation Profile: Recent Labs  Lab 07/10/19 0257  INR 1.4*    Cardiac Enzymes: No results for input(s): CKTOTAL, CKMB, CKMBINDEX, TROPONINI in the last 168 hours.  HbA1C: Hgb A1c MFr Bld  Date/Time Value Ref Range Status  05/27/2019 05:30 AM 7.4 (H) 4.8 - 5.6 % Final    Comment:    (NOTE) Pre diabetes:          5.7%-6.4% Diabetes:              >6.4% Glycemic control for   <7.0% adults with diabetes   05/08/2019 05:08 AM 6.8 (H) 4.8 - 5.6 % Final    Comment:    (NOTE) Pre diabetes:          5.7%-6.4% Diabetes:              >6.4% Glycemic control for   <7.0% adults with diabetes     CBG: Recent Labs  Lab 07/09/19 1516 07/09/19 1933 07/09/19 2322 07/10/19 0346 07/10/19 0717  GLUCAP 134* 152* 115* 130* 138*    Review of Systems:   Denies chest pain, SOB, difficulty breathing Complains of pain to right thigh with palpation   Past Medical History  She,  has a past medical history of Cervical cancer ([place]), CHF (congestive heart failure) ([place]), Chronic anemia, COPD (chronic obstructive pulmonary disease) ([place]), Diabetes mellitus without complication ([place]), Gastric ulcer, NSTEMI (non-ST elevated myocardial infarction) ([place]), and Upper GI bleed.   Surgical History    Past Surgical History:  Procedure Laterality Date  . AMPUTATION Left 06/24/2018   Procedure: AMPUTATION GREAT TOE;  Surgeon: Samara Deist, DPM;  Location: [facility];  Service: Podiatry;  Laterality: Left;  . AMPUTATION Right 07/04/2019   Procedure: RIGHT AMPUTATION ABOVE KNEE;  Surgeon: [name], MD;  Location: [place];  Service: Vascular;  Laterality: Right;  . APPENDECTOMY    . BIOPSY  11/22/2018   Procedure: BIOPSY;  Surgeon: Thornton Park, MD;  Location: [place];  Service: Gastroenterology;;  .  CARDIAC CATHETERIZATION    . CHOLECYSTECTOMY    . COLONOSCOPY N/A 09/28/2017   Procedure: COLONOSCOPY;  Surgeon: Toledo, Benay Pike, MD;  Location: [facility]OSCOPY;  Service: Gastroenterology;  Laterality: N/A;  . COLONOSCOPY WITH PROPOFOL N/A 08/21/2018   Procedure: COLONOSCOPY WITH PROPOFOL;  Surgeon: Lin Landsman, MD;  Location: [facility] ENDOSCOPY;  Service: Gastroenterology;  Laterality: N/A;  . COLONOSCOPY WITH PROPOFOL N/A 12/28/2018   Procedure: COLONOSCOPY WITH PROPOFOL;  Surgeon: Justice Britain  Brooke Bonito., MD;  Location: [facility];  Service: Gastroenterology;  Laterality: N/A;  . ENTEROSCOPY Left 11/06/2017   Procedure: ENTEROSCOPY;  Surgeon: Jonathon Bellows, MD;  Location: [facility] ENDOSCOPY;  Service: Gastroenterology;  Laterality: Left;  . ENTEROSCOPY N/A 06/22/2018   Procedure: ENTEROSCOPY;  Surgeon: Lin Landsman, MD;  Location: [facility] ENDOSCOPY;  Service: Gastroenterology;  Laterality: N/A;  . ENTEROSCOPY N/A 08/18/2018   Procedure: ENTEROSCOPY;  Surgeon: Jonathon Bellows, MD;  Location: [facility] ENDOSCOPY;  Service: Gastroenterology;  Laterality: N/A;  . ENTEROSCOPY N/A 11/01/2018   Procedure: ENTEROSCOPY;  Surgeon: Virgel Manifold, MD;  Location: [facility] ENDOSCOPY;  Service: Endoscopy;  Laterality: N/A;  . ENTEROSCOPY N/A 11/22/2018   Procedure: ENTEROSCOPY;  Surgeon: Thornton Park, MD;  Location: [place];  Service: Gastroenterology;  Laterality: N/A;  . ENTEROSCOPY Left 12/17/2018   Procedure: ENTEROSCOPY;  Surgeon: Yetta Flock, MD;  Location: [facility] ENDOSCOPY;  Service: Gastroenterology;  Laterality: Left;  . ENTEROSCOPY N/A 12/19/2018   Procedure: ENTEROSCOPY;  Surgeon: Yetta Flock, MD;  Location: [facility];  Service: Gastroenterology;  Laterality: N/A;  . ENTEROSCOPY N/A 12/20/2018   Procedure: ENTEROSCOPY;  Surgeon: Lavena Bullion, DO;  Location: [facility];  Service: Gastroenterology;  Laterality: N/A;  . ESOPHAGOGASTRODUODENOSCOPY N/A 12/19/2016    Procedure: ESOPHAGOGASTRODUODENOSCOPY (EGD);  Surgeon: Lin Landsman, MD;  Location: [facility]COPY;  Service: Gastroenterology;  Laterality: N/A;  . ESOPHAGOGASTRODUODENOSCOPY N/A 09/28/2017   Procedure: ESOPHAGOGASTRODUODENOSCOPY (EGD);  Surgeon: Toledo, Benay Pike, MD;  Location: [facility] ENDOSCOPY;  Service: Gastroenterology;  Laterality: N/A;  . ESOPHAGOGASTRODUODENOSCOPY (EGD) WITH PROPOFOL N/A 08/08/2017   Procedure: ESOPHAGOGASTRODUODENOSCOPY (EGD) WITH PROPOFOL;  Surgeon: [name], MD;  Location: [facility] ENDOSCOPY;  Service: Endoscopy;  Laterality: N/A;  . GIVENS CAPSULE STUDY N/A 10/28/2018   Procedure: GIVENS CAPSULE STUDY;  Surgeon: Jonathon Bellows, MD;  Location: [facility] ENDOSCOPY;  Service: Gastroenterology;  Laterality: N/A;  . GIVENS CAPSULE STUDY N/A 12/26/2018   Procedure: GIVENS CAPSULE STUDY;  Surgeon: Rush Landmark Telford Nab., MD;  Location: [facility]OS[facility];  Service: Gastroenterology;  Laterality: N/A;  . [place]    . HEMOSTASIS CLIP PLACEMENT  12/19/2018   Procedure: HEMOSTASIS CLIP PLACEMENT;  Surgeon: Yetta Flock, MD;  Location: [facility];  Service: Gastroenterology;;  . HEMOSTASIS CLIP PLACEMENT  12/28/2018   Procedure: HEMOSTASIS CLIP PLACEMENT;  Surgeon: Irving Copas., MD;  Location: [facility];  Service: Gastroenterology;;  . HOT HEMOSTASIS N/A 12/17/2018   Procedure: HOT HEMOSTASIS (ARGON PLASMA COAGULATION/BICAP);  Surgeon: Yetta Flock, MD;  Location: [facility];  Service: Gastroenterology;  Laterality: N/A;  . HOT HEMOSTASIS N/A 12/28/2018   Procedure: HOT HEMOSTASIS (ARGON PLASMA COAGULATION/BICAP);  Surgeon: Irving Copas., MD;  Location: [facility];  Service: Gastroenterology;  Laterality: N/A;  . LEFT HEART CATH AND CORONARY ANGIOGRAPHY N/A 12/23/2016   Procedure: LEFT HEART CATH AND CORONARY ANGIOGRAPHY;  Surgeon: Teodoro Spray, MD;  Location: [place] CV LAB;  Service: Cardiovascular;  Laterality:  N/A;  . LOWER EXTREMITY ANGIOGRAPHY Left 06/26/2018   Procedure: Lower Extremity Angiography;  Surgeon: Katha Cabal, MD;  Location: [place] [facility];  Service: Cardiovascular;  Laterality: Left;  . SUBMUCOSAL INJECTION  12/19/2018   Procedure: SUBMUCOSAL INJECTION;  Surgeon: Yetta Flock, MD;  Location: [facility];  Service: Gastroenterology;;  . Lia Foyer TATTOO INJECTION  12/17/2018   Procedure: SUBMUCOSAL TATTOO INJECTION;  Surgeon: Yetta Flock, MD;  Location: [facility];  Service: Gastroenterology;;  . Lia Foyer TATTOO INJECTION  12/28/2018   Procedure: SUBMUCOSAL  TATTOO INJECTION;  Surgeon: Irving Copas., MD;  Location: [facility];  Service: Gastroenterology;;  . TEE WITHOUT CARDIOVERSION N/A 10/29/2018   Procedure: TRANSESOPHAGEAL ECHOCARDIOGRAM (TEE);  Surgeon: Teodoro Spray, MD;  Location: [facility];  Service: Cardiovascular;  Laterality: N/A;  . TEE WITHOUT CARDIOVERSION N/A 05/30/2019   Procedure: TRANSESOPHAGEAL ECHOCARDIOGRAM (TEE);  Surgeon: Teodoro Spray, MD;  Location: [facility];  Service: Cardiovascular;  Laterality: N/A;     Social History   reports that she has been smoking cigarettes. She has been smoking about 0.25 packs per day. She has never used smokeless tobacco. She reports that she does not drink alcohol and does not use drugs.   Family History   Her family history includes Breast cancer in her mother; Cancer in her sister; Diabetes Mellitus II in her mother; Heart disease in her father; Hypertension in her mother.   Allergies Allergies  Allergen Reactions  . Latex Anaphylaxis and Rash  . Gabapentin Nausea And Vomiting and Other (See Comments)     Home Medications  Prior to Admission medications   Medication Sig Start Date End Date Taking? Authorizing Provider  Amino Acids-Protein Hydrolys (FEEDING SUPPLEMENT, PRO-STAT SUGAR FREE 64,) LIQD Take 30 mLs by mouth 2 (two) times daily. Patient taking differently: Take 30  mLs by mouth 2 (two) times daily with a meal.  12/07/18  Yes Al Decant, MD  atorvastatin (LIPITOR) 40 MG tablet Take 40 mg by mouth daily.   Yes [provider]  dronabinol (MARINOL) 2.5 MG capsule Take 1 capsule (2.5 mg total) by mouth 2 (two) times daily before lunch and supper. 06/05/19  Yes Lavina Hamman, MD  ferrous sulfate 325 (65 FE) MG tablet Take 1 tablet (325 mg total) by mouth 2 (two) times daily with a meal. 05/10/19  Yes Fritzi Mandes, MD  fluticasone (FLONASE) 50 MCG/ACT nasal spray Place 2 sprays into both nostrils daily.   Yes [provider]  folic acid (FOLVITE) 1 MG tablet Take 1 tablet (1 mg total) by mouth daily. 08/21/18  Yes Ojie, Jude, MD  furosemide (LASIX) 20 MG tablet Take 1 tablet (20 mg total) by mouth daily. 06/06/19  Yes Lavina Hamman, MD  hydrocerin (EUCERIN) CREA Apply 1 application topically 2 (two) times daily. 06/05/19  Yes Lavina Hamman, MD  HYDROcodone-acetaminophen (NORCO/VICODIN) 5-325 MG tablet Take 1 tablet by mouth every 6 (six) hours as needed for moderate pain.   Yes [provider]  lactulose (CHRONULAC) 10 GM/15ML solution Take 30 mLs (20 g total) by mouth 3 (three) times daily. 12/07/18  Yes Al Decant, MD  mirtazapine (REMERON) 30 MG tablet Take 30 mg by mouth at bedtime. 03/29/19  Yes [provider]  Multiple Vitamins-Minerals (CERTAVITE SENIOR PO) Take 1 tablet by mouth daily.   Yes [provider]  Nutritional Supplements (RESOURCE 2.0 PO) Take 1 Bottle by mouth 2 (two) times daily.   Yes [provider]  pregabalin (LYRICA) 75 MG capsule Take 1 capsule (75 mg total) by mouth 2 (two) times daily. 06/05/19  Yes Lavina Hamman, MD  collagenase (SANTYL) ointment Apply topically daily. Patient not taking: Reported on 06/27/2019 05/10/19   Fritzi Mandes, MD  Multiple Vitamin (MULTIVITAMIN WITH MINERALS) TABS tablet Take 1 tablet by mouth daily. Patient not taking: Reported on 06/27/2019 06/28/18    Lang Snow, NP     Critical care time: 74 minutes   Electronically signed by: Myrle Sheng AGACNP Student, 07/10/19, 1147

## 2019-07-10 NOTE — Progress Notes (Signed)
Pt's husband called and wanted to change the other designated visitor to Nedra Hai because that is who will be bringing him tomorrow. Visitor list changed for tomorrow.

## 2019-07-10 NOTE — Progress Notes (Signed)
CRITICAL VALUE ALERT  Critical Value: 5.2   Date & Time Notied: 6/23   0415    Provider Notified:MD sommer  Orders Received/Actions taken: awaiting orders

## 2019-07-10 NOTE — Progress Notes (Signed)
Labs drawn, critical hemoglobin of 5.2 received and forwarded to MD Oletta Darter as well as vascular MD Donzetta Matters.  3 units blood ordered.  Waiting for vascular to assess at this time

## 2019-07-11 DIAGNOSIS — I5032 Chronic diastolic (congestive) heart failure: Secondary | ICD-10-CM

## 2019-07-11 DIAGNOSIS — N179 Acute kidney failure, unspecified: Secondary | ICD-10-CM

## 2019-07-11 DIAGNOSIS — G4733 Obstructive sleep apnea (adult) (pediatric): Secondary | ICD-10-CM

## 2019-07-11 DIAGNOSIS — R5381 Other malaise: Secondary | ICD-10-CM

## 2019-07-11 DIAGNOSIS — N1832 Chronic kidney disease, stage 3b: Secondary | ICD-10-CM

## 2019-07-11 DIAGNOSIS — G9340 Encephalopathy, unspecified: Secondary | ICD-10-CM

## 2019-07-11 LAB — BASIC METABOLIC PANEL
Anion gap: 10 (ref 5–15)
BUN: 47 mg/dL — ABNORMAL HIGH (ref 8–23)
CO2: 22 mmol/L (ref 22–32)
Calcium: 8 mg/dL — ABNORMAL LOW (ref 8.9–10.3)
Chloride: 116 mmol/L — ABNORMAL HIGH (ref 98–111)
Creatinine, Ser: 2.52 mg/dL — ABNORMAL HIGH (ref 0.44–1.00)
GFR calc Af Amer: 23 mL/min — ABNORMAL LOW (ref >60)
GFR calc non Af Amer: 19 mL/min — ABNORMAL LOW (ref >60)
Glucose, Bld: 173 mg/dL — ABNORMAL HIGH (ref 70–99)
Potassium: 4.4 mmol/L (ref 3.5–5.1)
Sodium: 148 mmol/L — ABNORMAL HIGH (ref 135–145)

## 2019-07-11 LAB — GLUCOSE, CAPILLARY
Glucose-Capillary: 153 mg/dL — ABNORMAL HIGH (ref 70–99)
Glucose-Capillary: 182 mg/dL — ABNORMAL HIGH (ref 70–99)
Glucose-Capillary: 176 mg/dL — ABNORMAL HIGH (ref 70–99)
Glucose-Capillary: 173 mg/dL — ABNORMAL HIGH (ref 70–99)
Glucose-Capillary: 158 mg/dL — ABNORMAL HIGH (ref 70–99)
Glucose-Capillary: 169 mg/dL — ABNORMAL HIGH (ref 70–99)
Glucose-Capillary: 121 mg/dL — ABNORMAL HIGH (ref 70–99)

## 2019-07-11 LAB — TYPE AND SCREEN
ABO/RH(D): O POS
Antibody Screen: NEGATIVE
Unit division: 0
Unit division: 0
Unit division: 0

## 2019-07-11 LAB — PREPARE FRESH FROZEN PLASMA
Unit division: 0
Unit division: 0

## 2019-07-11 LAB — BPAM FFP
Blood Product Expiration Date: 202106262359
Blood Product Expiration Date: 202106262359
ISSUE DATE / TIME: 202106230655
ISSUE DATE / TIME: 202106231141
Unit Type and Rh: 6200
Unit Type and Rh: 6200

## 2019-07-11 LAB — CBC
HCT: 26.5 % — ABNORMAL LOW (ref 36.0–46.0)
Hemoglobin: 8 g/dL — ABNORMAL LOW (ref 12.0–15.0)
MCH: 27 pg (ref 26.0–34.0)
MCHC: 30.2 g/dL (ref 30.0–36.0)
MCV: 89.5 fL (ref 80.0–100.0)
Platelets: 101 10*3/uL — ABNORMAL LOW (ref 150–400)
RBC: 2.96 MIL/uL — ABNORMAL LOW (ref 3.87–5.11)
RDW: 22.4 % — ABNORMAL HIGH (ref 11.5–15.5)
WBC: 10.3 10*3/uL (ref 4.0–10.5)
nRBC: 0.4 % — ABNORMAL HIGH (ref 0.0–0.2)

## 2019-07-11 LAB — BPAM RBC
Blood Product Expiration Date: 202106302359
Blood Product Expiration Date: 202107202359
Blood Product Expiration Date: 202107202359
ISSUE DATE / TIME: 202106231347
ISSUE DATE / TIME: 202106231604
ISSUE DATE / TIME: 202106231835
Unit Type and Rh: 5100
Unit Type and Rh: 5100
Unit Type and Rh: 5100

## 2019-07-11 MED ORDER — LACTULOSE 10 GM/15ML PO SOLN
20.0000 g | Freq: Three times a day (TID) | ORAL | Status: DC
  Administered 2019-07-11 – 2019-07-12 (×3): 20 g via ORAL
  Filled 2019-07-11 (×3): qty 30

## 2019-07-11 MED ORDER — PRO-STAT SUGAR FREE PO LIQD
30.0000 mL | Freq: Every day | ORAL | Status: DC
  Administered 2019-07-12: 30 mL via ORAL
  Filled 2019-07-11: qty 30

## 2019-07-11 MED ORDER — PANTOPRAZOLE SODIUM 40 MG PO PACK
40.0000 mg | PACK | Freq: Every day | ORAL | Status: DC
  Administered 2019-07-12 – 2019-07-26 (×13): 40 mg via ORAL
  Filled 2019-07-11 (×17): qty 20

## 2019-07-11 MED ORDER — FUROSEMIDE 20 MG PO TABS
20.0000 mg | ORAL_TABLET | Freq: Every day | ORAL | Status: DC
  Administered 2019-07-12 – 2019-07-15 (×4): 20 mg via ORAL
  Filled 2019-07-11 (×4): qty 1

## 2019-07-11 MED ORDER — FREE WATER
400.0000 mL | Status: DC
  Administered 2019-07-11: 400 mL

## 2019-07-11 MED ORDER — FOLIC ACID 1 MG PO TABS
1.0000 mg | ORAL_TABLET | Freq: Every day | ORAL | Status: DC
  Administered 2019-07-12 – 2019-07-26 (×15): 1 mg via ORAL
  Filled 2019-07-11 (×15): qty 1

## 2019-07-11 MED ORDER — DOCUSATE SODIUM 50 MG/5ML PO LIQD
100.0000 mg | Freq: Every day | ORAL | Status: DC
  Administered 2019-07-13: 100 mg via ORAL
  Filled 2019-07-11 (×2): qty 10

## 2019-07-11 MED ORDER — ATORVASTATIN CALCIUM 40 MG PO TABS
40.0000 mg | ORAL_TABLET | Freq: Every day | ORAL | Status: DC
  Administered 2019-07-12 – 2019-07-26 (×15): 40 mg via ORAL
  Filled 2019-07-11 (×15): qty 1

## 2019-07-11 MED ORDER — FUROSEMIDE 40 MG PO TABS
20.0000 mg | ORAL_TABLET | Freq: Every day | ORAL | Status: DC
  Administered 2019-07-11: 20 mg
  Filled 2019-07-11: qty 1

## 2019-07-11 MED ORDER — ADULT MULTIVITAMIN W/MINERALS CH
1.0000 | ORAL_TABLET | Freq: Every day | ORAL | Status: DC
  Administered 2019-07-12 – 2019-07-26 (×15): 1 via ORAL
  Filled 2019-07-11 (×15): qty 1

## 2019-07-11 MED ORDER — FUROSEMIDE 40 MG PO TABS: 20.0000 mg | ORAL_TABLET | Freq: Every day | ORAL | Status: DC

## 2019-07-11 NOTE — Progress Notes (Signed)
Patient currently has Cortrack in place at this time.  RN informed me that it is supposed to be taken out tomorrow and regular diet instituted.  Will place patient on Bipap at that time.  Bipap is contraindicated with Cortrack and in most cases does not make a good seal.  No distress noted at this time, RN informed me that patient is currently using [facility] at this time.  Will continue to monitor.

## 2019-07-11 NOTE — Progress Notes (Addendum)
  Progress Note    07/11/2019 9:29 AM 7 Days Post-Op  Subjective:  No complaints   Vitals:   07/11/19 0727 07/11/19 0800  BP:  (!) 137/54  Pulse:  85  Resp:  20  Temp: 97.9 F (36.6 C)   SpO2:  99%    Physical Exam: Incisions:  R AKA incision c/d/i without palpable hematoma; no further bleeding   CBC    Component Value Date/Time   WBC 10.3 07/11/2019 0242   RBC 2.96 (L) 07/11/2019 0242   HGB 8.0 (L) 07/11/2019 0242   HGB 7.3 (L) 11/23/2017 1454   HCT 26.5 (L) 07/11/2019 0242   HCT 45.7 08/03/2013 2138   PLT 101 (L) 07/11/2019 0242   PLT 118 (L) 08/03/2013 2138   MCV 89.5 07/11/2019 0242   MCV 89 08/03/2013 2138   MCH 27.0 07/11/2019 0242   MCHC 30.2 07/11/2019 0242   RDW 22.4 (H) 07/11/2019 0242   RDW 13.6 08/03/2013 2138   LYMPHSABS 1.1 07/10/2019 0257   LYMPHSABS 3.4 08/03/2013 2138   MONOABS 1.2 (H) 07/10/2019 0257   MONOABS 0.7 08/03/2013 2138   EOSABS 0.7 (H) 07/10/2019 0257   EOSABS 0.3 08/03/2013 2138   BASOSABS 0.1 07/10/2019 0257   BASOSABS 0.1 08/03/2013 2138    BMET    Component Value Date/Time   NA 148 (H) 07/11/2019 0242   NA 139 08/03/2013 2138   K 4.4 07/11/2019 0242   K 3.4 (L) 08/03/2013 2138   CL 116 (H) 07/11/2019 0242   CL 104 08/03/2013 2138   CO2 22 07/11/2019 0242   CO2 27 08/03/2013 2138   GLUCOSE 173 (H) 07/11/2019 0242   GLUCOSE 153 (H) 08/03/2013 2138   BUN 47 (H) 07/11/2019 0242   BUN 16 08/03/2013 2138   CREATININE 2.52 (H) 07/11/2019 0242   CREATININE 1.19 08/03/2013 2138   CALCIUM 8.0 (L) 07/11/2019 0242   CALCIUM 9.2 08/03/2013 2138   GFRNONAA 19 (L) 07/11/2019 0242   GFRNONAA 50 (L) 08/03/2013 2138   GFRAA 23 (L) 07/11/2019 0242   GFRAA 58 (L) 08/03/2013 2138    INR    Component Value Date/Time   INR 1.2 07/10/2019 2253   INR 0.9 03/12/2012 1104     Intake/Output Summary (Last 24 hours) at 07/11/2019 0600 Last data filed at 07/11/2019 0800 Gross per 24 hour  Intake 3870.2 ml  Output 400 ml  Net  3470.2 ml     Assessment/Plan:  64 y.o. female is s/p right above knee amputation  7 Days Post-Op  - No further bleeding from R AKA incision - Staples out in 3-4 weeks - Goals of care meeting with palliative today  Dagoberto Ligas, PA-C [facility] [phone] 07/11/2019 9:29 AM    I have independently interviewed and examined patient and agree with PA assessment and plan above.  AKA site now hemostatic.  We will follow her up in 3 to 4 weeks and pending results of palliative care discussion can consider revascularization of left lower extremity as well.  Skyelar Swigart C. Donzetta Matters, MD [facility] [place] Office: [phone] Pager: [phone]

## 2019-07-11 NOTE — Progress Notes (Cosign Needed)
FOR EDUCATIONAL USE ONLY, NOT OFFICIAL PART OF MEDICAL RECORD   NAME:  [name], MRN:  [id number], DOB:  07/06/1955, LOS: 66 ADMISSION DATE:  06/27/2019, CONSULTATION DATE:  07/04/19 REFERRING MD:  Carlis Abbott, CHIEF COMPLAINT:  Respiratory failure   Brief History   64 year old female with a PMH significant for HFpEF, NSTEMI, COPD, uncontrolled DM II, anemia, CKD III, and hepatitis C with cirrhosis. 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 She was unable to be extubated following surgery and was admitted to the ICU. PCCM was consulted for ventilator management.   History of present illness   [name] is a 64 year old female with a PMH significant for HFpEF, NSTEMI, COPD, uncontrolled DM II, anemia, CKD III, and hepatitis C with cirrhosis who presented to [facility] ED from [place] facility for AMS. She was moaning and groaning without coherent conversation at her nursing facility and was noted to be hypoxic with oxygen saturations of 58%. She was placed on BiPAP at [facility] with improvement of her hypoxia. While at [facility], she underwent MRI which showed right foot abscess/myositis/tenosynovitis as well as left foot myositis and cellulitis. Vascular surgery was consulted and she was transferred to [facility] for right AKA and left angiography. 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 She was unable to generate adequate volumes to be extubated in the OR and weaned from MV and was thus admitted to the ICU. PCCM was consulted for ventilator management.  Past Medical History  HFpEF NSTEMI COPD Uncontrolled DM II Anemia CKD III Hepatitis C Cirrhosis Chronic venous leg ulcers  Significant Hospital Events   6/10 Admit to [facility] 6/13 Worsening lethargy and placed in BiPAP 6/17 Right AKA 6/22 Extubated  Consults:  Palliative Vascular  Procedures:  6/17 Right leg AKA  Significant Diagnostic Tests:  6/20 CXR  > Cardiomegaly and edema. Left base opacification - effusion vs atelectasis.   6/17 CT head > Chronic changes. No acute abnormalities  6/10 MRI Left tib/fib > nonspecific subcutaneous edema throughout RLL. No evidence of osteomyelitis. Mild diffuse intramuscular edema, myositis vs. Denervation changes  6/10 MRI right foot> no evidence of acute osteomyelitis. Ill-defined multilobulated fluid collections at dorsal proximal foot and along dorsal and medial foot concerning for developing abscesses. Diffuse edema suggesting myositis. Mild extensor digitorum longus tenosynovitis  6/10 MRI Left foot > significantly limited exam, pt unable to complete full exam. Interval amputation of L great toe at first MTP joint. Findings c/w reactive osteitis. Early acute osteomyelitis thought to be unlikely. Nonspecific myositis.  Micro Data:  6/17 MRSA PCR > Positive 6/10 Urine Culture > Negative 6/10 Blood cultures > 1/4 bottles diphtheroids   Antimicrobials:  Cefepime 6/10 >> 6/17 Vancomycin 6/10 >> 6/17  Interim history/subjective:  Received 3 units PRBC and 2 FFP yesterday for anemia. Hgb now 8.0 today and stable with corrected coagulopathy. Palliative care planning on meeting with husband today to discuss [place]. She has been afebrile and hemodynamically stable overnight, now on room air.  Objective   Blood pressure (!) 134/57, pulse 84, temperature 97.9 F (36.6 C), temperature source Oral, resp. rate 18, height 5' 5"  (1.651 m), weight 100 kg, SpO2 98 %.        Intake/Output Summary (Last 24 hours) at 07/11/2019 0827 Last data filed at 07/11/2019 0600 Gross per 24 hour  Intake 4126.28 ml  Output 400 ml  Net 3726.28 ml  Filed Weights   07/08/19 0500 07/09/19 0440 07/10/19 0500  Weight: 96 kg 99.4 kg 100 kg    Examination: General: Elderly female sitting upright in chair watching television in no obvious distress HENT: EOMI. MMM. Cortrak left nare. Non-icteric sclera Lungs: No accessory  muscle use or signs of respiratory distress. Lungs CTAB. No nasal flaring Cardiovascular: Normal S1/S2. No murmurs. Distal capillary refill immediate Abdomen: Soft and non-tender. Obese Extremities: Right AKA, edematous. Compartment soft compressible. No bleeding noted from staple line. Neuro: Follows simple commands. Converses appropriately. Eyes open spontaneously. MAE.  GU: Deferred  Resolved Hospital Problem list    Assessment & Plan:  Metabolic encephalopathy Chronic Hepatitis C, possible cirrhosis Resolving. Many derangements including hypernatremia, hypoxia, hepatic and renal dysfunction.  -Continue lactulose -Normalize derangements as detailed below -Sleep hygiene, minimize stimulation  Acute hypoxic respiratory failure OSA Weaned from MV and extubated yesterday, 6/22. DNR/DNI.  -Nocturnal and PRN NIPPV -Maintain SpO2 > 92%  HFpEF Echo 6/10 EF 40-45% with grade III diastolic dysfunction. On Lasix at home.  -HDS. Restart home lasix   Acute on chronic renal failure, stage III  -Avoid nephrotoxic meds -Trend renal indices -No indication for dialysis  Acute blood loss anemia secondary to right AKA bleeding stump Coagulopathy, thrombocytopenia Bleeding from wound on 6/22, requiring repacking by vascular surgery. Received 3 PRBC and 2 FFP. H/H stable today.  -Holding anticoagulation due to bleeding -Monitor for signs and symptoms of anemia. No obvious bleeding at this time. -Can we resume AC today? -Transfuse for Hgb < 7   Hypernatremia Down to 148 today from 149.   -Calculated FWD 2.6L. Would increase FWF to 432m q4h to correct. -Continue to trend on daily BMP  DM II, uncontrolled HgbA1c 7.4 5/10. Euglycemic on SSI  -Continue SSI -Goal BG 140-1813mdL  Best practice:  Diet: TF via cortrak  Pain/Anxiety/Delirium protocol (if indicated): N/A VAP protocol (if indicated): N/A DVT prophylaxis: SCD GI prophylaxis: PPI Glucose control: DM II - Euglycemic on  SSI Mobility: Bedrest Code Status: DNR/DNI Family Communication: Pending Disposition: ICU  Labs   CBC: Recent Labs  Lab 07/06/19 0600 07/08/19 1108 07/10/19 0257 07/10/19 2253 07/11/19 0242  WBC 11.5* 9.9 11.7* 10.6* 10.3  NEUTROABS  --   --  8.7*  --   --   HGB 8.3* 7.3* 5.2* 8.1* 8.0*  HCT 27.5* 25.4* 18.4* 26.1* 26.5*  MCV 81.8 86.1 88.9 88.8 89.5  PLT 108* 106* 105* 99* 101*    Basic Metabolic Panel: Recent Labs  Lab 07/06/19 0600 07/08/19 1108 07/09/19 0947 07/10/19 0257 07/11/19 0242  NA 146* 148* 149* 149* 148*  K 4.1 3.4* 3.7 3.8 4.4  CL 115* 116* 119* 117* 116*  CO2 22 24 22  21* 22  GLUCOSE 160* 166* 204* 135* 173*  BUN 42* 46* 45* 46* 47*  CREATININE 2.29* 2.70* 2.62* 2.62* 2.52*  CALCIUM 8.2* 8.2* 7.9* 7.8* 8.0*  MG  --   --  1.7  --   --   PHOS  --   --  1.9* 2.4*  --    GFR: Estimated Creatinine Clearance: 26.4 mL/min (A) (by C-G formula based on SCr of 2.52 mg/dL (H)). Recent Labs  Lab 07/08/19 1108 07/10/19 0257 07/10/19 2253 07/11/19 0242  WBC 9.9 11.7* 10.6* 10.3    Liver Function Tests: Recent Labs  Lab 07/04/19 1636 07/05/19 0759 07/08/19 1108 07/09/19 0947  AST 14* 16 14* 11*  ALT 8 10 9 9   ALKPHOS 109 101 110 90  BILITOT  0.8 1.7* 0.9 1.0  PROT 6.8 7.0 7.7 7.0  ALBUMIN 1.6* 1.7* 1.6* 1.5*   No results for input(s): LIPASE, AMYLASE in the last 168 hours. Recent Labs  Lab 07/04/19 1636 07/05/19 1213  AMMONIA 21 20    ABG    Component Value Date/Time   PHART 7.388 07/04/2019 1400   PCO2ART 34.5 07/04/2019 1400   PO2ART 107 07/04/2019 1400   HCO3 20.4 07/04/2019 1400   TCO2 28 09/29/2006 1033   ACIDBASEDEF 3.8 (H) 07/04/2019 1400   O2SAT 98.3 07/04/2019 1400     Coagulation Profile: Recent Labs  Lab 07/10/19 0257 07/10/19 2253  INR 1.4* 1.2    Cardiac Enzymes: No results for input(s): CKTOTAL, CKMB, CKMBINDEX, TROPONINI in the last 168 hours.  HbA1C: Hgb A1c MFr Bld  Date/Time Value Ref Range Status    05/27/2019 05:30 AM 7.4 (H) 4.8 - 5.6 % Final    Comment:    (NOTE) Pre diabetes:          5.7%-6.4% Diabetes:              >6.4% Glycemic control for   <7.0% adults with diabetes   05/08/2019 05:08 AM 6.8 (H) 4.8 - 5.6 % Final    Comment:    (NOTE) Pre diabetes:          5.7%-6.4% Diabetes:              >6.4% Glycemic control for   <7.0% adults with diabetes     CBG: Recent Labs  Lab 07/10/19 1511 07/10/19 1910 07/10/19 2309 07/11/19 0304 07/11/19 0725  GLUCAP 150* 137* 129* 153* 182*    Review of Systems:   Negative for dyspnea, congestion, or chest pain Negative for generalized pain Negative for fever, chills Negative for nausea/vomiting  Past Medical History  She,  has a past medical history of Cervical cancer ([place]), CHF (congestive heart failure) ([place]), Chronic anemia, COPD (chronic obstructive pulmonary disease) ([place]), Diabetes mellitus without complication ([place]), Gastric ulcer, NSTEMI (non-ST elevated myocardial infarction) ([place]), and Upper GI bleed.   Surgical History    Past Surgical History:  Procedure Laterality Date  . AMPUTATION Left 06/24/2018   Procedure: AMPUTATION GREAT TOE;  Surgeon: Samara Deist, DPM;  Location: [facility];  Service: Podiatry;  Laterality: Left;  . AMPUTATION Right 07/04/2019   Procedure: RIGHT AMPUTATION ABOVE KNEE;  Surgeon: [name], MD;  Location: [place];  Service: Vascular;  Laterality: Right;  . APPENDECTOMY    . BIOPSY  11/22/2018   Procedure: BIOPSY;  Surgeon: Thornton Park, MD;  Location: [place];  Service: Gastroenterology;;  . CARDIAC CATHETERIZATION    . CHOLECYSTECTOMY    . COLONOSCOPY N/A 09/28/2017   Procedure: COLONOSCOPY;  Surgeon: Toledo, Benay Pike, MD;  Location: [facility] ENDOSCOPY;  Service: Gastroenterology;  Laterality: N/A;  . COLONOSCOPY WITH PROPOFOL N/A 08/21/2018   Procedure: COLONOSCOPY WITH PROPOFOL;  Surgeon: Lin Landsman, MD;  Location[facility] ENDOSCOPY;  Service: Gastroenterology;   Laterality: N/A;  . COLONOSCOPY WITH PROPOFOL N/A 12/28/2018   Procedure: COLONOSCOPY WITH PROPOFOL;  Surgeon: Rush Landmark Telford Nab., MD;  Location: [facility]COPY;  Service: Gastroenterology;  Laterality: N/A;  . ENTEROSCOPY Left 11/06/2017   Procedure: ENTEROSCOPY;  Surgeon: Jonathon Bellows, MD;  Location: [facility] ENDOSCOPY;  Service: Gastroenterology;  Laterality: Left;  . ENTEROSCOPY N/A 06/22/2018   Procedure: ENTEROSCOPY;  Surgeon: Lin Landsman, MD;  Location: [facility] ENDOSCOPY;  Service: Gastroenterology;  Laterality: N/A;  . ENTEROSCOPY N/A 08/18/2018   Procedure: ENTEROSCOPY;  Surgeon: Jonathon Bellows, MD;  Location: [facility]COPY;  Service: Gastroenterology;  Laterality: N/A;  . ENTEROSCOPY N/A 11/01/2018   Procedure: ENTEROSCOPY;  Surgeon: Virgel Manifold, MD;  Location: [facility] ENDOSCOPY;  Service: Endoscopy;  Laterality: N/A;  . ENTEROSCOPY N/A 11/22/2018   Procedure: ENTEROSCOPY;  Surgeon: Thornton Park, MD;  Location: [place];  Service: Gastroenterology;  Laterality: N/A;  . ENTEROSCOPY Left 12/17/2018   Procedure: ENTEROSCOPY;  Surgeon: Yetta Flock, MD;  Location: [facility]COPY;  Service: Gastroenterology;  Laterality: Left;  . ENTEROSCOPY N/A 12/19/2018   Procedure: ENTEROSCOPY;  Surgeon: Yetta Flock, MD;  Location: [facility];  Service: Gastroenterology;  Laterality: N/A;  . ENTEROSCOPY N/A 12/20/2018   Procedure: ENTEROSCOPY;  Surgeon: Lavena Bullion, DO;  Location: [facility];  Service: Gastroenterology;  Laterality: N/A;  . ESOPHAGOGASTRODUODENOSCOPY N/A 12/19/2016   Procedure: ESOPHAGOGASTRODUODENOSCOPY (EGD);  Surgeon: Lin Landsman, MD;  Location: [facility] ENDOSCOPY;  Service: Gastroenterology;  Laterality: N/A;  . ESOPHAGOGASTRODUODENOSCOPY N/A 09/28/2017   Procedure: ESOPHAGOGASTRODUODENOSCOPY (EGD);  Surgeon: Toledo, Benay Pike, MD;  Location: [facility] ENDOSCOPY;  Service: Gastroenterology;  Laterality: N/A;  . ESOPHAGOGASTRODUODENOSCOPY (EGD) WITH  PROPOFOL N/A 08/08/2017   Procedure: ESOPHAGOGASTRODUODENOSCOPY (EGD) WITH PROPOFOL;  Surgeon: [name], MD;  Location: [facility] ENDOSCOPY;  Service: Endoscopy;  Laterality: N/A;  . GIVENS CAPSULE STUDY N/A 10/28/2018   Procedure: GIVENS CAPSULE STUDY;  Surgeon: Jonathon Bellows, MD;  Location: [facility] ENDOSCOPY;  Service: Gastroenterology;  Laterality: N/A;  . GIVENS CAPSULE STUDY N/A 12/26/2018   Procedure: GIVENS CAPSULE STUDY;  Surgeon: Rush Landmark Telford Nab., MD;  Location: [facility];  Service: Gastroenterology;  Laterality: N/A;  . heart [place]    . HEMOSTASIS CLIP PLACEMENT  12/19/2018   Procedure: HEMOSTASIS CLIP PLACEMENT;  Surgeon: Yetta Flock, MD;  Location: [facility];  Service: Gastroenterology;;  . HEMOSTASIS CLIP PLACEMENT  12/28/2018   Procedure: HEMOSTASIS CLIP PLACEMENT;  Surgeon: Irving Copas., MD;  Location: [facility];  Service: Gastroenterology;;  . HOT HEMOSTASIS N/A 12/17/2018   Procedure: HOT HEMOSTASIS (ARGON PLASMA COAGULATION/BICAP);  Surgeon: Yetta Flock, MD;  Location: [facility];  Service: Gastroenterology;  Laterality: N/A;  . HOT HEMOSTASIS N/A 12/28/2018   Procedure: HOT HEMOSTASIS (ARGON PLASMA COAGULATION/BICAP);  Surgeon: Irving Copas., MD;  Location: [facility];  Service: Gastroenterology;  Laterality: N/A;  . LEFT HEART CATH AND CORONARY ANGIOGRAPHY N/A 12/23/2016   Procedure: LEFT HEART CATH AND CORONARY ANGIOGRAPHY;  Surgeon: Teodoro Spray, MD;  Location: [place] CV LAB;  Service: Cardiovascular;  Laterality: N/A;  . LOWER EXTREMITY ANGIOGRAPHY Left 06/26/2018   Procedure: Lower Extremity Angiography;  Surgeon: Katha Cabal, MD;  Location: [place] CV LAB;  Service: Cardiovascular;  Laterality: Left;  . SUBMUCOSAL INJECTION  12/19/2018   Procedure: SUBMUCOSAL INJECTION;  Surgeon: Yetta Flock, MD;  Location: [facility];  Service: Gastroenterology;;  . Lia Foyer TATTOO  INJECTION  12/17/2018   Procedure: SUBMUCOSAL TATTOO INJECTION;  Surgeon: Yetta Flock, MD;  Location: [facility];  Service: Gastroenterology;;  . Lia Foyer TATTOO INJECTION  12/28/2018   Procedure: SUBMUCOSAL TATTOO INJECTION;  Surgeon: Irving Copas., MD;  Location: [facility];  Service: Gastroenterology;;  . TEE WITHOUT CARDIOVERSION N/A 10/29/2018   Procedure: TRANSESOPHAGEAL ECHOCARDIOGRAM (TEE);  Surgeon: Teodoro Spray, MD;  Location: [facility];  Service: Cardiovascular;  Laterality: N/A;  . TEE WITHOUT CARDIOVERSION N/A 05/30/2019   Procedure: TRANSESOPHAGEAL ECHOCARDIOGRAM (TEE);  Surgeon: Teodoro Spray, MD;  Location: [facility];  Service: Cardiovascular;  Laterality: N/A;     Social History   reports that she has been smoking cigarettes. She has been smoking about 0.25 packs per day. She has never used smokeless tobacco. She reports that she does not drink alcohol and does not use drugs.   Family History   Her family history includes Breast cancer in her mother; Cancer in her sister; Diabetes Mellitus II in her mother; Heart disease in her father; Hypertension in her mother.   Allergies Allergies  Allergen Reactions  . Latex Anaphylaxis and Rash  . Gabapentin Nausea And Vomiting and Other (See Comments)     Home Medications  Prior to Admission medications   Medication Sig Start Date End Date Taking? Authorizing Provider  Amino Acids-Protein Hydrolys (FEEDING SUPPLEMENT, PRO-STAT SUGAR FREE 64,) LIQD Take 30 mLs by mouth 2 (two) times daily. Patient taking differently: Take 30 mLs by mouth 2 (two) times daily with a meal.  12/07/18  Yes Al Decant, MD  atorvastatin (LIPITOR) 40 MG tablet Take 40 mg by mouth daily.   Yes [provider]  dronabinol (MARINOL) 2.5 MG capsule Take 1 capsule (2.5 mg total) by mouth 2 (two) times daily before lunch and supper. 06/05/19  Yes Lavina Hamman, MD  ferrous sulfate 325 (65 FE) MG tablet Take 1 tablet (325  mg total) by mouth 2 (two) times daily with a meal. 05/10/19  Yes Fritzi Mandes, MD  fluticasone (FLONASE) 50 MCG/ACT nasal spray Place 2 sprays into both nostrils daily.   Yes [provider]  folic acid (FOLVITE) 1 MG tablet Take 1 tablet (1 mg total) by mouth daily. 08/21/18  Yes Ojie, Jude, MD  furosemide (LASIX) 20 MG tablet Take 1 tablet (20 mg total) by mouth daily. 06/06/19  Yes Lavina Hamman, MD  hydrocerin (EUCERIN) CREA Apply 1 application topically 2 (two) times daily. 06/05/19  Yes Lavina Hamman, MD  HYDROcodone-acetaminophen (NORCO/VICODIN) 5-325 MG tablet Take 1 tablet by mouth every 6 (six) hours as needed for moderate pain.   Yes [provider]  lactulose (CHRONULAC) 10 GM/15ML solution Take 30 mLs (20 g total) by mouth 3 (three) times daily. 12/07/18  Yes Al Decant, MD  mirtazapine (REMERON) 30 MG tablet Take 30 mg by mouth at bedtime. 03/29/19  Yes [provider]  Multiple Vitamins-Minerals (CERTAVITE SENIOR PO) Take 1 tablet by mouth daily.   Yes [provider]  Nutritional Supplements (RESOURCE 2.0 PO) Take 1 Bottle by mouth 2 (two) times daily.   Yes [provider]  pregabalin (LYRICA) 75 MG capsule Take 1 capsule (75 mg total) by mouth 2 (two) times daily. 06/05/19  Yes Lavina Hamman, MD  collagenase (SANTYL) ointment Apply topically daily. Patient not taking: Reported on 06/27/2019 05/10/19   Fritzi Mandes, MD  Multiple Vitamin (MULTIVITAMIN WITH MINERALS) TABS tablet Take 1 tablet by mouth daily. Patient not taking: Reported on 06/27/2019 06/28/18   Lang Snow, NP     Critical care time: 35 minutes   Electronically signed by: Myrle Sheng AGACNP Student, 07/10/19, [phone]

## 2019-07-11 NOTE — Progress Notes (Signed)
Pts husband, Kela Millin, called and made aware that his wife will be transferring to [facility]. Update given to husband.  Report called to RN, all questions and concerns answered. Pt to be transferred via bed with personal belongings.

## 2019-07-11 NOTE — Plan of Care (Signed)
Discussed her care with Dr. Grandville Silos from [facility], who will assume her care tomorrow.  Julian Hy, DO 07/11/19 2:54 PM [facility]

## 2019-07-11 NOTE — Progress Notes (Signed)
Occupational Therapy Treatment Patient Details Name: [name] MRN: [id number] DOB: 01/24/1955 Today's Date: 07/11/2019    History of present illness [name] is a 64yo f with a PMHx of diastolic CHF with anasarca, NSTEMI, COPD, DMII uncontrolled, anemia, CKD IIIB, hepatitis C with cirrhosis who presented for altered mental status. 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 She was unable to be extubated following surgery. CCM was consulted for ventilator management.  Extubated 6/22.   OT comments  Pt alert and interactive today. Focus of session on sitting tolerance and balance at EOB and simple grooming task. Pt continues to demonstrate significant cognitive impairment. Stable VS throughout session on RA. Pt to move out of ICU today, did not progress OOB to chair.  Follow Up Recommendations  SNF    Equipment Recommendations       Recommendations for Other Services      Precautions / Restrictions Precautions Precautions: Fall       Mobility Bed Mobility Overal bed mobility: Needs Assistance Bed Mobility: Rolling;Supine to Sit;Sit to Supine Rolling: Mod assist   Supine to sit: Mod assist;+2 for physical assistance Sit to supine: Mod assist;+2 for physical assistance   General bed mobility comments: cues for direction, assist to come L and forward up onto L elbow and truncal assist/hand over hand assist of UE's to help come forward/scoot, rolled for placement of clean bed pad using rail   Transfers Overall transfer level: Needs assistance   Transfers: Sit to/from Stand Sit to Stand: Total assist;+2 physical assistance         General transfer comment: NT pt moving to the floor    Balance Overall balance assessment: History of Falls;Needs assistance Sitting-balance support: Feet supported Sitting balance-Leahy Scale: Fair Sitting balance - Comments: Initially listing posteriorly,  fearful of falling forward, but with balance work at EOB, pt able to sit upright without assist and begin working on reach with rotation tasks grabbing object and placing them at various places away from midline. with min guard to minimal stability assist/guard.     Standing balance-Leahy Scale: Zero                             ADL either performed or assessed with clinical judgement   ADL Overall ADL's : Needs assistance/impaired     Grooming: Minimal assistance;Sitting (lotion on hands/back)               Lower Body Dressing: Total assistance Lower Body Dressing Details (indicate cue type and reason): L sock     Toileting- Clothing Manipulation and Hygiene: Total assistance         General ADL Comments: Pt much more alert and interactive this date.     Vision   Additional Comments: vision appears grossly intact   Perception     Praxis      Cognition Arousal/Alertness: Awake/alert Behavior During Therapy: WFL for tasks assessed/performed Overall Cognitive Status: Impaired/Different from baseline Area of Impairment: Orientation;Attention;Memory;Following commands;Safety/judgement;Awareness;Problem solving                 Orientation Level: Place;Time;Situation Current Attention Level: Focused Memory: Decreased short-term memory Following Commands: Follows one step commands inconsistently;Follows one step commands with increased time Safety/Judgement: Decreased awareness of safety Awareness: Intellectual Problem Solving: Slow processing General Comments: Pt stating she was in [place], knew her husband's name, donned and doffed her  restraint mitts        Exercises Other Exercises Other Exercises: worked on reaching outside [place] in sitting at [employer]   Shoulder Instructions       General Comments      Pertinent Vitals/ Pain       Pain Assessment: Faces Faces Pain Scale: No hurt Pain Intervention(s): Monitored during session  Home Living  Family/patient expects to be discharged to:: Skilled nursing facility                                        Prior Functioning/Environment              Frequency  Min 2X/week        Progress Toward Goals  OT Goals(current goals can now be found in the care plan section)  Progress towards OT goals: Progressing toward goals  Acute Rehab OT Goals Patient Stated Goal: "to get better" OT Goal Formulation: With patient Time For Goal Achievement: 07/22/19 Potential to Achieve Goals: Good  Plan Discharge plan remains appropriate    Co-evaluation    PT/OT/SLP Co-Evaluation/Treatment: Yes Reason for Co-Treatment: For patient/therapist safety PT goals addressed during session: Mobility/safety with mobility OT goals addressed during session: Strengthening/ROM      AM-PAC OT "6 Clicks" Daily Activity     Outcome Measure   Help from another person eating meals?: Total Help from another person taking care of personal grooming?: A Little Help from another person toileting, which includes using toliet, bedpan, or urinal?: Total Help from another person bathing (including washing, rinsing, drying)?: A Lot Help from another person to put on and taking off regular upper body clothing?: A Lot Help from another person to put on and taking off regular lower body clothing?: Total 6 Click Score: 10    End of Session Equipment Utilized During Treatment: Gait belt  OT Visit Diagnosis: Unsteadiness on feet (R26.81);Muscle weakness (generalized) (M62.81)   Activity Tolerance Patient tolerated treatment well   Patient Left in bed;with call bell/phone within reach;with bed alarm set   Nurse Communication Mobility status        Time: 2094-7096 OT Time Calculation (min): 30 min  Charges: OT General Charges $OT Visit: 1 Visit OT Treatments $Therapeutic Activity: 8-22 mins  Nestor Lewandowsky, OTR/L Acute Rehabilitation Services Pager: [phone] Office:  [phone]   [name] 07/11/2019, 12:49 PM

## 2019-07-11 NOTE — Progress Notes (Signed)
NAME:  [name], MRN:  [id number], DOB:  [birth year], LOS: 16 ADMISSION DATE:  06/27/2019, CONSULTATION DATE:  07/04/19 REFERRING MD:  Dr. Carlis Abbott, CHIEF COMPLAINT:  Post-op respiratory failure  Brief History   [name] is a 64yo f with a PMHx of diastolic CHF with anasarca, NSTEMI, COPD, DMII uncontrolled, anemia, CKD IIIB, hepatitis C with cirrhosis who presented for altered mental status. 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 She was unable to be extubated following surgery. CCM was consulted for ventilator management.  History of present illness   [name] is a 64yo f with a PMHx of diastolic CHF with anasarca, NSTEMI, COPD, DMII uncontrolled, anemia, CKD IIIB, hepatitis C with cirrhosis who presented to [facility] ED from [facility] facility for altered mental status. Per chart review, patient's nurse at the nursing facility had noted that she had been moaning and groaning without coherent conversation, though at baseline she is oriented and able to converse normally. Pt was also noted to be hypoxic to 58% while at nursing facility. She does not normally wear O2 at home. While at [facility], pt also developed desaturations and was placed and maintained on BiPAP with improvement in hypoxia. She developed worsening lethargy and unresponsiveness on 6/14 that required her to be placed on BiPAP again. Pt also underwent MRI that showed R foot abscess/myositis/tenosynovitis as well as L foot myositis and leg cellulitis. Not thought to be osteomyelitis. Vascular surgery was consulted and recommended right above knee amputation and left angiography. Patient's husband had refused amputation and consented to have R AKA on 6/17. Patient was transferred to [facility] for the procedure. She underwent AKA on 4/09 without complication. Extubation was attempted in the OR including with pressure support  but patient was unable to generate adequate volumes. She also was unable to be weaned while at PACU.  Notably, patient was admitted at [facility] between 5/9-5/20/21 for Group c strep bacteremia and bilateral leg edema, as well as R leg superficial wounds and cellulitis. Per discharge summary, patient's mental status was impaired and thought to be due to sepsis. During this hospitalization, she had a TEE (05/30/19) and prosthetic valve endocarditis was ruled out. Patient had also completed a course of Augmentin for bilateral lower extremity ulcers/cellulitis on June 8th.    Past Medical History  Diastolic CHF Chronic anemia COPD Diabetes mellitus II  Gastric ulcer NSTEMI CKD IIIB Hepatitis C with cirrhosis Chronic leg venous ulcers  Significant Hospital Events   6/10 > Admitted to [facility] 6/12 > Transferred to [facility] from [place] 6/13 > rapid response called at [facility] due to worsening lethargy. Placed on BiPAP 6/17> R AKA 6/22 extubated  Consults:  Palliative care  Vascular surgery  Procedures:  6/17 > R leg AKA 6/17-22 ETT  Significant Diagnostic Tests:   CXR 6/10 > increasing CHF with R basilar atelectasis.   MRI L tibia/fibula 6/10 > nonspecific subcutaneous edema throughout RLL. No evidence of osteomyelitis. Mild diffuse intramuscular edema, myositis vs. Denervation changes  MRI R foot 6/10 > no evidence of acute osteomyelitis. Ill-defined multilobulated fluid collections at dorsal proximal foot and along dorsal and medial foot concerning for developing abscesses. Diffuse edema suggesting myositis. Mild extensor digitorum longus tenosynovitis  MRI L foot 6/10 > significantly limited exam, pt unable to complete full exam. Interval amputation of L great toe at first MTP joint. Findings c/w reactive  osteitis. Early acute osteomyelitis thought to be unlikely. Nonspecific myositis.  MRI L tibia/fibula 6/11 > negative for osteomyelitis or myositis. Diffuse  subcutaneous edema, possible cellulitis. No subcutaneous abscess though limited exam.  MRI Head 6/13 > no acute intracranial abnormality, chronic ischemic microangiopathy  CXR 6/20: bilateral effusions no significant air space disease.   Micro Data:  Ucx 6/10 > neg Blood cx 6/10 > 1/2 diptheroids COVID 6/10 > neg MRSA PCR 6/17 > positive  Antimicrobials:  Cefepime 6/10  > Vancomycin 6/10 >  Interim history/subjective:   3 PRBC 2 FFP ordered for Hgb 5.2, INR 1.4   Pt has lost IV this morning and attempts are being made to replace   Objective   Blood pressure (!) 137/54, pulse 85, temperature 97.9 F (36.6 C), temperature source Oral, resp. rate 20, height 5' 5"  (1.651 m), weight 100 kg, SpO2 99 %.        Intake/Output Summary (Last 24 hours) at 07/11/2019 0917 Last data filed at 07/11/2019 0800 Gross per 24 hour  Intake 3870.2 ml  Output 400 ml  Net 3470.2 ml   Filed Weights   07/08/19 0500 07/09/19 0440 07/10/19 0500  Weight: 96 kg 99.4 kg 100 kg    Examination: General: Chronically ill appearing older adult F, seated in bedside recliner NAD   HENT: NCAT pink mmm cortrak  Lungs: L sided rhonchi. Even unlabored respirations. No accessory muscle use   Cardiovascular: RRR s1s2 no rgm cap refill < 3 seconds  Abdomen: Obese soft round ndnt  Extremities: R BKA. L foot with amputated first toe  Neuro: AAO x3 following commands Skin: c/d/w without rash   Resolved Hospital Problem list   Acute hypoxic respiratory failure requiring mechanical ventilation   Hemorrhagic shock   Assessment & Plan:   Acute hypoxic respiratory failure requiring mechanical ventilation, improved - was requiring BiPAP pre-op   COPD Sleep apnea Extubated to bipap 6/22.  P -PRN and QHS BiPAP  -As per Palliative and PCCM prior notes, no re-intubation  Status post right AKA for chronic foot ischemia ABLA from surgery, ongoing blood loss in ICU P -VVS following post op  -Hgb stable  following transfusion 6/23; continued hemostasis  -Trend H/H; hgb goal > 7  Acute metabolic encephalopathy, improving  P -Delirium precautions, promote sleep hygiene  -correct metabolic abnormalities as able   Chronic HCV, carries diagnosis of probable cirrhosis, imaging is not confirmatory  -Continue lactulose  AKI on CKD IIIb -interval down trend in Cr from 2.2 to 2.5  -Fena was ordered 6/22 but doesn't appear to be collected  P - trend renal indices, avoid nephrotoxic meds as able   Hypernatremia   P -  FWF  - Cont to trend BMP  Chronic systolic CHF P -Lasix 52DP qD   Type 2 diabetes; BG at goal P -Continue sliding scale insulin as needed -Goal BG 140-180 while admitted to the ICU  Chronic debility; resident of SNF PTA -Appreciate palliative care assistance -- plans to speak with husband 6/24 -PT OT SLP    Daily Goals Checklist  Pain/Anxiety/Delirium protocol (if indicated): Delirium precautions  VAP protocol (if indicated): na  Respiratory support goals: Nocturnal and PRN NPPV  DVT prophylaxis: on hold due to bleeding Nutrition Status: tube feeds via Cortrak - moderate nutritional risk  GI prophylaxis: Protonix Glucose control: SSI Mobility/therapy needs: baseline bedbound, appreciate PT/OT Daily labs: CBC, BMP Code Status: DNR/I Family Communication: patient updated 6/24, pending communication with husband.  Disposition: Stable for transfer  out of ICU   Labs   CBC: Recent Labs  Lab 07/06/19 0600 07/08/19 1108 07/10/19 0257 07/10/19 2253 07/11/19 0242  WBC 11.5* 9.9 11.7* 10.6* 10.3  NEUTROABS  --   --  8.7*  --   --   HGB 8.3* 7.3* 5.2* 8.1* 8.0*  HCT 27.5* 25.4* 18.4* 26.1* 26.5*  MCV 81.8 86.1 88.9 88.8 89.5  PLT 108* 106* 105* 99* 101*    Basic Metabolic Panel: Recent Labs  Lab 07/06/19 0600 07/08/19 1108 07/09/19 0947 07/10/19 0257 07/11/19 0242  NA 146* 148* 149* 149* 148*  K 4.1 3.4* 3.7 3.8 4.4  CL 115* 116* 119* 117* 116*  CO2  22 24 22  21* 22  GLUCOSE 160* 166* 204* 135* 173*  BUN 42* 46* 45* 46* 47*  CREATININE 2.29* 2.70* 2.62* 2.62* 2.52*  CALCIUM 8.2* 8.2* 7.9* 7.8* 8.0*  MG  --   --  1.7  --   --   PHOS  --   --  1.9* 2.4*  --    GFR: Estimated Creatinine Clearance: 26.4 mL/min (A) (by C-G formula based on SCr of 2.52 mg/dL (H)). Recent Labs  Lab 07/08/19 1108 07/10/19 0257 07/10/19 2253 07/11/19 0242  WBC 9.9 11.7* 10.6* 10.3    Liver Function Tests: Recent Labs  Lab 07/04/19 1636 07/05/19 0759 07/08/19 1108 07/09/19 0947  AST 14* 16 14* 11*  ALT 8 10 9 9   ALKPHOS 109 101 110 90  BILITOT 0.8 1.7* 0.9 1.0  PROT 6.8 7.0 7.7 7.0  ALBUMIN 1.6* 1.7* 1.6* 1.5*   No results for input(s): LIPASE, AMYLASE in the last 168 hours. Recent Labs  Lab 07/04/19 1636 07/05/19 1213  AMMONIA 21 20    ABG    Component Value Date/Time   PHART 7.388 07/04/2019 1400   PCO2ART 34.5 07/04/2019 1400   PO2ART 107 07/04/2019 1400   HCO3 20.4 07/04/2019 1400   TCO2 28 09/29/2006 1033   ACIDBASEDEF 3.8 (H) 07/04/2019 1400   O2SAT 98.3 07/04/2019 1400     Coagulation Profile: Recent Labs  Lab 07/10/19 0257 07/10/19 2253  INR 1.4* 1.2    Cardiac Enzymes: No results for input(s): CKTOTAL, CKMB, CKMBINDEX, TROPONINI in the last 168 hours.  HbA1C: Hgb A1c MFr Bld  Date/Time Value Ref Range Status  05/27/2019 05:30 AM 7.4 (H) 4.8 - 5.6 % Final    Comment:    (NOTE) Pre diabetes:          5.7%-6.4% Diabetes:              >6.4% Glycemic control for   <7.0% adults with diabetes   05/08/2019 05:08 AM 6.8 (H) 4.8 - 5.6 % Final    Comment:    (NOTE) Pre diabetes:          5.7%-6.4% Diabetes:              >6.4% Glycemic control for   <7.0% adults with diabetes     CBG: Recent Labs  Lab 07/10/19 1511 07/10/19 1910 07/10/19 2309 07/11/19 0304 07/11/19 0725  GLUCAP 150* 137* 129* 153* [place] MSN, AGACNP-BC [place] [phone] If no  answer, [phone] 07/11/2019, 9:17 AM

## 2019-07-12 LAB — BASIC METABOLIC PANEL
Anion gap: 11 (ref 5–15)
BUN: 45 mg/dL — ABNORMAL HIGH (ref 8–23)
CO2: 22 mmol/L (ref 22–32)
Calcium: 8 mg/dL — ABNORMAL LOW (ref 8.9–10.3)
Chloride: 109 mmol/L (ref 98–111)
Creatinine, Ser: 2.52 mg/dL — ABNORMAL HIGH (ref 0.44–1.00)
GFR calc Af Amer: 23 mL/min — ABNORMAL LOW (ref >60)
GFR calc non Af Amer: 19 mL/min — ABNORMAL LOW (ref >60)
Glucose, Bld: 118 mg/dL — ABNORMAL HIGH (ref 70–99)
Potassium: 3.9 mmol/L (ref 3.5–5.1)
Sodium: 142 mmol/L (ref 135–145)

## 2019-07-12 LAB — GLUCOSE, CAPILLARY
Glucose-Capillary: 100 mg/dL — ABNORMAL HIGH (ref 70–99)
Glucose-Capillary: 100 mg/dL — ABNORMAL HIGH (ref 70–99)
Glucose-Capillary: 112 mg/dL — ABNORMAL HIGH (ref 70–99)
Glucose-Capillary: 118 mg/dL — ABNORMAL HIGH (ref 70–99)
Glucose-Capillary: 141 mg/dL — ABNORMAL HIGH (ref 70–99)
Glucose-Capillary: 152 mg/dL — ABNORMAL HIGH (ref 70–99)

## 2019-07-12 LAB — CBC
HCT: 28.6 % — ABNORMAL LOW (ref 36.0–46.0)
Hemoglobin: 8.6 g/dL — ABNORMAL LOW (ref 12.0–15.0)
MCH: 27.4 pg (ref 26.0–34.0)
MCHC: 30.1 g/dL (ref 30.0–36.0)
MCV: 91.1 fL (ref 80.0–100.0)
Platelets: 118 10*3/uL — ABNORMAL LOW (ref 150–400)
RBC: 3.14 MIL/uL — ABNORMAL LOW (ref 3.87–5.11)
RDW: 23.2 % — ABNORMAL HIGH (ref 11.5–15.5)
WBC: 10.3 10*3/uL (ref 4.0–10.5)
nRBC: 0.5 % — ABNORMAL HIGH (ref 0.0–0.2)

## 2019-07-12 MED ORDER — ENSURE ENLIVE PO LIQD
237.0000 mL | Freq: Three times a day (TID) | ORAL | Status: DC
  Administered 2019-07-12 (×2): 237 mL via ORAL

## 2019-07-12 MED ORDER — PRO-STAT SUGAR FREE PO LIQD
30.0000 mL | Freq: Three times a day (TID) | ORAL | Status: DC
  Administered 2019-07-12 – 2019-07-24 (×29): 30 mL via ORAL
  Filled 2019-07-12 (×32): qty 30

## 2019-07-12 MED ORDER — LACTULOSE 10 GM/15ML PO SOLN
10.0000 g | Freq: Two times a day (BID) | ORAL | Status: DC
  Administered 2019-07-12 – 2019-07-13 (×2): 10 g via ORAL
  Filled 2019-07-12 (×2): qty 15

## 2019-07-12 MED ORDER — ENOXAPARIN SODIUM 30 MG/0.3ML ~~LOC~~ SOLN
30.0000 mg | Freq: Every day | SUBCUTANEOUS | Status: DC
  Administered 2019-07-12 – 2019-07-17 (×6): 30 mg via SUBCUTANEOUS
  Filled 2019-07-12 (×6): qty 0.3

## 2019-07-12 NOTE — Progress Notes (Signed)
PROGRESS NOTE    [name]  KZL:[id number] DOB: [birth year] DOA: 06/27/2019 PCP: [place]  Brief Narrative: [name] is a chronically ill female resident of [facility] with multiple medical problems namely CAD, COPD, CKD stage IIIb, chronic diastolic CHF, hep C, cirrhosis was admitted with sepsis, encephalopathy and extensive full-thickness tissue loss of right foot,, MRI indicated right foot abscess, myositis and tenosynovitis as well as left foot myositis and cellulitis, underwent right above-knee amputation on 6/17 by vascular, she was unable to be extubated postop and was admitted to the ICU on 6/17. [facility] course complicated by encephalopathy, has been treated for hepatic encephalopathy with lactulose -Also noted to have dysphagia requiring NG tube, tube feeds -Prognosis felt to be very poor, palliative medicine team was consulted by critical care MD -Also treated for postop blood loss anemia and hemorrhagic shock -She was transferred from [facility] to [facility] service today 6/25 -In addition recently admitted to [facility] from 5/9 through 5/24 group C strep bacteremia, she had a TEE at the time which ruled out prosthetic valve endocarditis   Assessment & Plan:   Severe sepsis Right foot abscess, myositis -Status post right above-knee amputation on 6/17 -Off antibiotics -Vascular following -Monitor left foot  Acute toxic and metabolic encephalopathy -Secondary to severe sepsis, hepatic encephalopathy -Probably combination of ICU delirium as well -Improving -SLP evaluation completed yesterday, discontinue cortrak, start diet  Hemorrhagic shock Postop bleeding -Appears to have resolved -Transfused 7 units of blood this admission -Hemoglobin stable now, monitor  Acute on chronic respiratory failure COPD  -Prolonged mechanical ventilation from 6/17 through 6/22 -Maintain euvolemia, continue Lasix p.o. -Continue BiPAP nightly and daytime naps  AKI on CKD  3B -Creatinine relatively stable now in the 2.5 range, monitor  Hepatic encephalopathy Hepatitis C Liver cirrhosis -Continue lactulose, decrease dose given worsening diarrhea  Chronic diastolic CHF -Appears euvolemic, continue low-dose p.o. Lasix  Type 2 diabetes mellitus -CBGs are stable, continue sliding scale insulin  Ethics, chronic debility, multiple serious medical problems including vascular disease, CAD, COPD, OSA, CKD, liver cirrhosis, multiple frequent hospitalizations, high risk of ongoing decline -Palliative medicine consulted and following, now DNR  DVT prophylaxis: Add Lovenox Code Status: DNR Family Communication: No family at bedside, will attempt to contact spouse Disposition Plan:  Status is: Inpatient  Remains inpatient appropriate because:Inpatient level of care appropriate due to severity of illness   Dispo: The patient is from: SNF              Anticipated d/c is to: SNF              Anticipated d/c date is: 3 days              Patient currently is not medically stable to d/c. Consultants:   PCCM transfer, vascular surgery, palliative medicine   Procedures: Right above-knee amputation 6/17  Antimicrobials:    Subjective: -Continues to have some diarrhea from her lactulose -Mental status improving per staff compared to yesterday  Objective: Vitals:   07/11/19 1945 07/11/19 2022 07/12/19 0039 07/12/19 0419  BP: 130/60 (!) 152/80 133/78 (!) 150/77  Pulse: 91 94 89 93  Resp:  19 15 18   Temp:  97.7 F (36.5 C) 97.7 F (36.5 C) 97.7 F (36.5 C)  TempSrc:  Oral Oral Oral  SpO2: 100% 95% 95% 94%  Weight:   100 kg   Height:        Intake/Output Summary (Last 24 hours) at 07/12/2019 1108 Last data filed  at 07/12/2019 0557 Gross per 24 hour  Intake 835 ml  Output 330 ml  Net 505 ml   Filed Weights   07/09/19 0440 07/10/19 0500 07/12/19 0039  Weight: 99.4 kg 100 kg 100 kg    Examination:  General exam: Obese chronically ill female  appears much older than stated age, awake alert oriented to self and partly to place Respiratory system: Decreased breath sounds at both bases Cardiovascular system: S1 & S2 heard, RRR. Gastrointestinal system: Abdomen is nondistended, soft and nontender.Normal bowel sounds heard. Central nervous system: Awake alert, oriented to self and partly to place, moves all extremities, no asterixis today Extremities: Right above-knee amputation  skin: No rashes on exposed skin  psychiatry: Poor insight and judgment  Data Reviewed:   CBC: Recent Labs  Lab 07/08/19 1108 07/10/19 0257 07/10/19 2253 07/11/19 0242 07/12/19 0557  WBC 9.9 11.7* 10.6* 10.3 10.3  NEUTROABS  --  8.7*  --   --   --   HGB 7.3* 5.2* 8.1* 8.0* 8.6*  HCT 25.4* 18.4* 26.1* 26.5* 28.6*  MCV 86.1 88.9 88.8 89.5 91.1  PLT 106* 105* 99* 101* 144*   Basic Metabolic Panel: Recent Labs  Lab 07/08/19 1108 07/09/19 0947 07/10/19 0257 07/11/19 0242 07/12/19 0801  NA 148* 149* 149* 148* 142  K 3.4* 3.7 3.8 4.4 3.9  CL 116* 119* 117* 116* 109  CO2 24 22 21* 22 22  GLUCOSE 166* 204* 135* 173* 118*  BUN 46* 45* 46* 47* 45*  CREATININE 2.70* 2.62* 2.62* 2.52* 2.52*  CALCIUM 8.2* 7.9* 7.8* 8.0* 8.0*  MG  --  1.7  --   --   --   PHOS  --  1.9* 2.4*  --   --    GFR: Estimated Creatinine Clearance: 26.4 mL/min (A) (by C-G formula based on SCr of 2.52 mg/dL (H)). Liver Function Tests: Recent Labs  Lab 07/08/19 1108 07/09/19 0947  AST 14* 11*  ALT 9 9  ALKPHOS 110 90  BILITOT 0.9 1.0  PROT 7.7 7.0  ALBUMIN 1.6* 1.5*   No results for input(s): LIPASE, AMYLASE in the last 168 hours. Recent Labs  Lab 07/05/19 1213  AMMONIA 20   Coagulation Profile: Recent Labs  Lab 07/10/19 0257 07/10/19 2253  INR 1.4* 1.2   Cardiac Enzymes: No results for input(s): CKTOTAL, CKMB, CKMBINDEX, TROPONINI in the last 168 hours. BNP (last 3 results) No results for input(s): PROBNP in the last 8760 hours. HbA1C: No results for  input(s): HGBA1C in the last 72 hours. CBG: Recent Labs  Lab 07/11/19 1908 07/11/19 2122 07/12/19 0041 07/12/19 0421 07/12/19 0737  GLUCAP 169* 121* 100* 100* 112*   Lipid Profile: No results for input(s): CHOL, HDL, LDLCALC, TRIG, CHOLHDL, LDLDIRECT in the last 72 hours. Thyroid Function Tests: No results for input(s): TSH, T4TOTAL, FREET4, T3FREE, THYROIDAB in the last 72 hours. Anemia Panel: No results for input(s): VITAMINB12, FOLATE, FERRITIN, TIBC, IRON, RETICCTPCT in the last 72 hours. Urine analysis:    Component Value Date/Time   COLORURINE YELLOW 06/27/2019 [place] 06/27/2019 1242   APPEARANCEUR Clear 08/03/2013 2138   LABSPEC 1.011 06/27/2019 1242   LABSPEC 1.028 08/03/2013 2138   PHURINE 5.0 06/27/2019 1242   GLUCOSEU NEGATIVE 06/27/2019 1242   GLUCOSEU Negative 08/03/2013 2138   HGBUR NEGATIVE 06/27/2019 1242   BILIRUBINUR NEGATIVE 06/27/2019 1242   BILIRUBINUR Negative 08/03/2013 2138   KETONESUR NEGATIVE 06/27/2019 1242   PROTEINUR NEGATIVE 06/27/2019 1242   UROBILINOGEN 0.2  09/27/2006 2132   NITRITE NEGATIVE 06/27/2019 1242   LEUKOCYTESUR NEGATIVE 06/27/2019 1242   LEUKOCYTESUR Negative 08/03/2013 2138   Sepsis Labs: @LABRCNTIP (procalcitonin:4,lacticidven:4)  ) Recent Results (from the past 240 hour(s))  Surgical pcr screen     Status: Abnormal   Collection Time: 07/04/19  8:51 AM   Specimen: Nasal Mucosa; Nasal Swab  Result Value Ref Range Status   MRSA, PCR POSITIVE (A) NEGATIVE Final    Comment: RESULT CALLED TO, READ BACK BY AND VERIFIED WITH: T. Tijani RN 11:25 07/04/19 ([email or web address])    Staphylococcus aureus POSITIVE (A) NEGATIVE Final    Comment: (NOTE) The Xpert SA Assay (FDA approved for NASAL specimens in patients 37 years of age and older), is one component of a comprehensive surveillance program. It is not intended to diagnose infection nor to guide or monitor treatment. Performed at [facility], [place] [address] [place], [place] [address]          Radiology Studies: No results found.      Scheduled Meds: . sodium chloride   Intravenous Once  . atorvastatin  40 mg Oral Daily  . Chlorhexidine Gluconate Cloth  6 each Topical Daily  . docusate  100 mg Oral Daily  . feeding supplement (PRO-STAT SUGAR FREE 64)  30 mL Oral Daily  . folic acid  1 mg Oral Daily  . furosemide  20 mg Oral Daily  . hydrocortisone cream   Topical BID  . insulin aspart  0-15 Units Subcutaneous Q4H  . lactulose  20 g Oral TID  . mouth rinse  15 mL Mouth Rinse BID  . multivitamin with minerals  1 tablet Oral Daily  . mupirocin ointment   Nasal BID  . pantoprazole sodium  40 mg Oral Daily   Continuous Infusions: . sodium chloride Stopped (07/11/19 0608)     LOS: 15 days    Time spent: 77mn  PDomenic Polite MD [facility]  07/12/2019, 11:08 AM

## 2019-07-12 NOTE — Progress Notes (Signed)
Cortrack removed today. Bipap held at this time. Pt in no distress on room air. RT available if needed.

## 2019-07-12 NOTE — Progress Notes (Signed)
Nutrition Follow-up  DOCUMENTATION CODES:   Obesity unspecified  INTERVENTION:   -Ensure Enlive po TID, each supplement provides 350 kcal and 20 grams of protein -30 ml Prostat TID, each supplement provides 100 kcals and 15 grams protein -MVI with minerals daily -Magic cup TID with meals, each supplement provides 290 kcal and 9 grams of protein  NUTRITION DIAGNOSIS:   Increased nutrient needs related to wound healing as evidenced by estimated needs.  Ongoing  GOAL:   Patient will meet greater than or equal to 90% of their needs  Progressing   MONITOR:   PO intake, Supplement acceptance, Diet advancement, Labs, Weight trends, Skin, I & O's  REASON FOR ASSESSMENT:   Consult Enteral/tube feeding initiation and management  ASSESSMENT:   Patient with PMH significant for CHF, NSTEMI, COPD, DM, gastric ulcer, chronic hepatitis C, cirrhosis of liver, and CKD III. Presents this admission with acute exacerbation CHF and bilateral lower extremity wounds.  6/17 - s/p R AKA 6/18 - Cortrak placed, tip gastric 6/19 - pt vomited TF and bile, TF stopped 6/20 - TF restarted 6/22 - extubated  Reviewed I/O's: +616 ml x 24 hours and +1.6 L since admission  UOP: 200 ml x 24 hours  Rectal tube output: 130 ml x 24 hours  Pt resting quietly at time of visit. Noted meal tray on tray table; untouched  Case discussed with nursing staff.  Plan to d/c cortrak tube and TF today. SLP has been pt and advanced to heart healthy/ carb modified diet.   Per palliative care notes, plan to s/c back to SNF with outpatient palliative care services once medically stable.   Labs reviewed: CBGS: 100-112 (inpatient orders for glycemic control are 0-15 units insulin aspart every 4 hours).   Diet Order:   Diet Order            Diet heart healthy/carb modified Room service appropriate? Yes; Fluid consistency: Thin  Diet effective now                 EDUCATION NEEDS:   Not appropriate for  education at this time  Skin:  Skin Assessment: Skin Integrity Issues: Skin Integrity Issues:: Other (Comment), Incisions, Stage II DTI: L heel Stage II: right thigh, left thigh Unstageable: L thigh, R foot Incisions: right knee Other: wound to sacrum  Last BM:  07/12/19 (130 ml via rectal pouch)  Height:   Ht Readings from Last 1 Encounters:  06/28/19 5' 5"  (1.651 m)    Weight:   Wt Readings from Last 1 Encounters:  07/12/19 100 kg    Ideal Body Weight:  52.3 kg (adjusted for AKA)  BMI:  Body mass index is 36.69 kg/m.  Estimated Nutritional Needs:   Kcal:  1900-2100  Protein:  100-115 grams  Fluid:  >/= 1.9 L/day    Loistine Chance, RD, LDN, CDCES Registered Dietitian II Certified Diabetes Care and Education Specialist Please refer to [facility] for RD and/or RD on-call/weekend/after hours pager

## 2019-07-12 NOTE — Progress Notes (Signed)
   Palliative Medicine Inpatient Follow Up Note   HPI: 64 y.o. female  with past medical history of chronic systolic heart failure, COPD, DM2, DOE, OSA, Hep C, other cirrhosis, CKD III, lower extremity cellulitis, lower extremity venous stasis ulcers,  admitted on 06/27/2019 with complaints of altered mental status. Workup reveals metabolic encephalopathy in the setting of infectious process related to lower extremity cellulitis, myositis, osteoitis. Vascular surgery consulted and recommend BKA. Spouse initially has not consented for surgery. Palliative consulted for [place].    Patient since that time underwent a AKA and since has not been able to tolerate ventilatory weaning. We were asked to speak to patients husband about goals of care moving forward being that she will likely get extubated though there will most probably be another event causing hospitalization given her comorbid conditions.  Today's Discussion (07/12/2019): Chart reviewed patient evaluated at bedside. Patient is able to open eyes today and respond to my questions though she is visibly disoriented. She is no longer intubated though remains on 2LPM [place]. She has now been initiated on a cardiac diet with plans to DC the coretrack.   Patients husband called to provide a daily update. He brought up concerns about her present insurance coverage and his hope for her to not go back to [facility]. I shared with him that I would reach out to the [facility] team to help with this. Kela Millin shares that he knows his wife is sick but he does want to see if she can make improvements. We discussed her many chronic medical conditions and the possibility of rapid decline. I recommended at the least to consider OP Palliative Care which he was agreeable towards.  Discussed the importance of continued conversation with family and their  medical providers regarding overall plan of care and treatment options, ensuring decisions are within the context of the patients  values and GOCs.  Questions and concerns addressed   SUMMARY OF RECOMMENDATIONS - DNR/DNI - Will meet with Kela Millin tomorrow afternoon  - I plan to request outpatient palliative follow up for [place] depending on how well she improves  Time Spent: 25 minutes Greater than 50% of the time was spent in counseling and coordination of care ______________________________________________________________________________________ [place] Team Team Cell Phone: [phone] Please utilize secure chat with additional questions, if there is no response within 30 minutes please call the above phone number  Palliative Medicine Team providers are available by phone from 7am to 7pm daily and can be reached through the team cell phone.  Should this patient require assistance outside of these hours, please call the patient's attending physician.

## 2019-07-12 NOTE — Progress Notes (Signed)
Hydrologist [facility])  Hospital Liaison: RN note       Notified by [facility] manager of patient/family request for [facility] Palliative services at home after discharge.       Spoke briefly with patients spouse Kela Millin to confirm interest and explain services. Kela Millin was upset and stated that his wife was no where near discharge. He then stated he had an important phone call and would need for me to contact him later. I will try again to confirm palliative at facility once discharged.             Please call with any hospice or palliative related questions.       Thank you for this referral.       Clementeen Hoof, RN, BSN [place] (listed on [place] under Hosp[facility] and [place] [facility] [place])   [phone]

## 2019-07-12 NOTE — Progress Notes (Signed)
Bedside swallow assessment -Late entry    07/11/19 1456  SLP Visit Information  SLP Received On 07/11/19  General Information  HPI Miriana Gaertner is a 64 yo f with a PMHx of diastolic CHF with anasarca, NSTEMI, COPD, DMII uncontrolled, anemia, CKD IIIB, hepatitis C with cirrhosis who presented for altered mental status. 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 BSE 11/2018 after admission for GIB and AMS. Ultimately placed on Dys 2/thin and preferred to remain on this due to tiring easily.  Type of Study Bedside Swallow Evaluation  Previous Swallow Assessment  (see HPI)  Diet Prior to this Study NPO  Temperature Spikes Noted No  Respiratory Status Room air  History of Recent Intubation Yes  Length of Intubations (days) 5 days  Date extubated 07/09/19  Behavior/Cognition Alert;Cooperative;Pleasant mood  Oral Cavity Assessment Other (comment) (lingual candidias)  Oral Care Completed by SLP No  Oral Cavity - Dentition  (has most- missing several posterior)  Vision Functional for self-feeding  Self-Feeding Abilities Able to feed self  Patient Positioning Upright in bed  Baseline Vocal Quality Hoarse  Volitional Cough Strong  Volitional Swallow Able to elicit  Pain Assessment  Pain Assessment Faces  Faces Pain Scale 2  Pain Intervention(s) Monitored during session  Oral Assessment (Complete on admission/transfer/change in patient condition)  Does patient have any of the following "high(er) risk" factors? Nutritional status - fluids only or NPO for >24 hours  Does patient have any of the following "at risk" factors? Other - dysphagia  Patient is AT RISK Order set for Adult Oral Care Protocol initiated -  "At Risk Patients" option selected (see row information)  Oral Motor/Sensory Function  Overall Oral Motor/Sensory Function  (left facial appeared decreased- she denied)   Ice Chips  Ice chips NT  Thin Liquid  Thin Liquid WFL  Presentation Cup;Straw  Other Comments cough x 1 after solids trial  Nectar Thick Liquid  Nectar Thick Liquid NT  Honey Thick Liquid  Honey Thick Liquid NT  Puree  Puree WFL  Solid  Solid WFL  SLP - End of Session  Patient left in bed;with call bell/phone within reach  Nurse Communication Diet recommendation;Swallow strategies reviewed  SLP Assessment  Clinical Impression Statement (ACUTE ONLY) Pt's vocal quality minimally hoarse with strong volitional cough. Respiratory and deglutory systems coordinated. Following thin liquid after solid trial pt did have a delayed cough after 5 day intubation. Functional mastication of solid. There were no indications of signifiant deconditioning where she needs altered texture. Recomend regular/thin, pills with thin following all swallow precautions. No further ST recommended.    SLP Visit Diagnosis Dysphagia, unspecified (R13.10)  Impact on safety and function Mild aspiration risk  Swallow Evaluation Recommendations  SLP Diet Recommendations Regular;Thin liquid  Liquid Administration via Cup;Straw  Medication Administration Whole meds with liquid  Supervision Patient able to self feed  Postural Changes Seated upright at 90 degrees  Treatment Plan  Oral Care Recommendations Oral care BID  Treatment Recommendations No treatment recommended at this time  Follow up Recommendations None  Individuals Consulted  Consulted and Agree with Results and Recommendations Patient;RN  SLP Time Calculation  SLP Start Time (ACUTE ONLY) 1440  SLP Stop Time (ACUTE ONLY) 1450  SLP Time Calculation (min) (ACUTE ONLY) 10 min  SLP Evaluations  $ SLP Speech Visit 1 Visit  SLP Evaluations  $BSS Swallow 1 Procedure   Lattie Haw  Jannifer [employer].Ed Risk analyst [phone] Office [phone]

## 2019-07-12 NOTE — Progress Notes (Signed)
Progress Note    07/12/2019 12:59 PM 8 Days Post-Op  Subjective: Awake and alert without complaints   Vitals:   07/12/19 0419 07/12/19 1239  BP: (!) 150/77 (!) 164/89  Pulse: 93 (!) 102  Resp: 18 18  Temp: 97.7 F (36.5 C) 98 F (36.7 C)  SpO2: 94% 90%    Physical Exam: Cardiac: Rate and rhythm are regular Lungs: Nonlabored Incisions: Right AKA incision well approximated with staples.  2 nylon sutures in place in the medial aspect. Extremities: Right residual limb with moderate edema.  No bleeding.  Flaps are warm and well perfused.  No signs of infection.  CBC    Component Value Date/Time   WBC 10.3 07/12/2019 0557   RBC 3.14 (L) 07/12/2019 0557   HGB 8.6 (L) 07/12/2019 0557   HGB 7.3 (L) 11/23/2017 1454   HCT 28.6 (L) 07/12/2019 0557   HCT 45.7 08/03/2013 2138   PLT 118 (L) 07/12/2019 0557   PLT 118 (L) 08/03/2013 2138   MCV 91.1 07/12/2019 0557   MCV 89 08/03/2013 2138   MCH 27.4 07/12/2019 0557   MCHC 30.1 07/12/2019 0557   RDW 23.2 (H) 07/12/2019 0557   RDW 13.6 08/03/2013 2138   LYMPHSABS 1.1 07/10/2019 0257   LYMPHSABS 3.4 08/03/2013 2138   MONOABS 1.2 (H) 07/10/2019 0257   MONOABS 0.7 08/03/2013 2138   EOSABS 0.7 (H) 07/10/2019 0257   EOSABS 0.3 08/03/2013 2138   BASOSABS 0.1 07/10/2019 0257   BASOSABS 0.1 08/03/2013 2138    BMET    Component Value Date/Time   NA 142 07/12/2019 0801   NA 139 08/03/2013 2138   K 3.9 07/12/2019 0801   K 3.4 (L) 08/03/2013 2138   CL 109 07/12/2019 0801   CL 104 08/03/2013 2138   CO2 22 07/12/2019 0801   CO2 27 08/03/2013 2138   GLUCOSE 118 (H) 07/12/2019 0801   GLUCOSE 153 (H) 08/03/2013 2138   BUN 45 (H) 07/12/2019 0801   BUN 16 08/03/2013 2138   CREATININE 2.52 (H) 07/12/2019 0801   CREATININE 1.19 08/03/2013 2138   CALCIUM 8.0 (L) 07/12/2019 0801   CALCIUM 9.2 08/03/2013 2138   GFRNONAA 19 (L) 07/12/2019 0801   GFRNONAA 50 (L) 08/03/2013 2138   GFRAA 23 (L) 07/12/2019 0801   GFRAA 58 (L) 08/03/2013  2138     Intake/Output Summary (Last 24 hours) at 07/12/2019 1259 Last data filed at 07/12/2019 0557 Gross per 24 hour  Intake 835 ml  Output 330 ml  Net 505 ml    HOSPITAL MEDICATIONS Scheduled Meds: . sodium chloride   Intravenous Once  . atorvastatin  40 mg Oral Daily  . Chlorhexidine Gluconate Cloth  6 each Topical Daily  . docusate  100 mg Oral Daily  . enoxaparin (LOVENOX) injection  30 mg Subcutaneous Daily  . feeding supplement (PRO-STAT SUGAR FREE 64)  30 mL Oral Daily  . folic acid  1 mg Oral Daily  . furosemide  20 mg Oral Daily  . hydrocortisone cream   Topical BID  . insulin aspart  0-15 Units Subcutaneous Q4H  . lactulose  10 g Oral BID  . mouth rinse  15 mL Mouth Rinse BID  . multivitamin with minerals  1 tablet Oral Daily  . mupirocin ointment   Nasal BID  . pantoprazole sodium  40 mg Oral Daily   Continuous Infusions: . sodium chloride Stopped (07/11/19 0608)   PRN Meds:.sodium chloride, acetaminophen **OR** acetaminophen  Assessment: POD 8 right above-the-knee amputation  secondary to right foot full-thickness tissue loss and nonambulatory patient.  Postoperative beat bleeding 2 days ago with placement of nylon suture.  Residual limb is currently hemostatic.  Stable after transfusion.  Plan: -Staple and suture removal in approximately 3 to 4 weeks.  Pending results of palliative care, Dr. Donzetta Matters will discuss consideration of revascularization of left lower extremity at follow-up. -DVT prophylaxis: Lovenox   Risa Grill, PA-C [facility] [phone] 07/12/2019  12:59 PM

## 2019-07-12 NOTE — NC FL2 (Signed)
[place] LEVEL OF CARE SCREENING TOOL     IDENTIFICATION  Patient Name: [name] Birthdate: Dec 17, 1955 Sex: female Admission Date (Current Location): 06/27/2019  [facility] and [place] Number:  Herbalist and Address:  The . [facility], [place] [address], [place], [place] [address]      Provider Number: [id number]  Attending Physician Name and Address:  Domenic Polite, MD  Relative Name and Phone Number:       Current Level of Care: Hospital Recommended Level of Care: [place] [place] Prior Approval Number:    Date Approved/Denied:   PASRR Number: [id number]  Discharge Plan: SNF    Current Diagnoses: Patient Active Problem List   Diagnosis Date Noted  . Palliative care encounter   . DNR (do not resuscitate)   . Respiratory acidosis   . Hyperkalemia   . Advanced care planning/counseling discussion   . Chronic systolic CHF (congestive heart failure) ([place]) 06/27/2019  . Venous stasis ulcer ([place]) 06/27/2019  . Type 2 diabetes mellitus with stage 3b chronic kidney disease, with long-term current use of insulin ([place])   . DNR (do not resuscitate) discussion   . Acute metabolic encephalopathy   . Hypotension   . Acute kidney injury superimposed on CKD ([place])   . Goals of care, counseling/discussion   . Palliative care by specialist   . Anasarca 05/26/2019  . Cellulitis 05/26/2019  . Diabetic foot ulcer ([place]) 05/26/2019  . Decreased level of consciousness 05/26/2019  . Moderate mitral regurgitation by prior echocardiogram 05/04/2019  . Moderate - severe tricuspid regurgitation by prior echocardiogram 05/04/2019  . CKD stage 3 due to type 2 diabetes mellitus ([place]) 05/04/2019  . HCV antibody positive 05/04/2019  . Other cirrhosis of liver ([place]) 05/04/2019  . Fall 02/24/2019  . UTI (urinary tract infection) 02/23/2019  . AVM (arteriovenous malformation) of small bowel, acquired with hemorrhage   . Epistaxis  11/29/2018  . Altered mental status   . Heme positive stool   . Gastritis and gastroduodenitis   . Encephalopathy acute   . Acute on chronic blood loss anemia 11/20/2018  . Pressure injury of skin 10/26/2018  . Anxiety 08/15/2018  . Chronic back pain 08/15/2018  . GERD (gastroesophageal reflux disease) 08/15/2018  . History of carpal tunnel syndrome 08/15/2018  . HNP (herniated nucleus pulposus), lumbar 08/15/2018  . Hyperlipidemia 08/15/2018  . Hypertension 08/15/2018  . Insomnia 08/15/2018  . Left ventricular hypertrophy 08/15/2018  . Obesity 08/15/2018  . Sleep apnea 08/15/2018  . Anemia of chronic disease 06/20/2018  . Diabetic polyneuropathy associated with type 2 diabetes mellitus ([place]) 02/23/2018  . Moderate tricuspid regurgitation 02/18/2018  . Acute on chronic diastolic CHF (congestive heart failure) ([place]) 02/08/2018  . 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 Depression 02/05/2018  . Generalized abdominal pain 02/05/2018  . CHF (congestive heart failure) ([place]) 01/13/2018  . B12 deficiency 12/07/2017  . Iron deficiency anemia due to chronic blood loss 12/07/2017  . Folate deficiency 12/07/2017  . Upper GI bleed 11/03/2017  . COPD (chronic obstructive pulmonary disease) ([place]) 09/26/2017  . Symptomatic anemia 09/26/2017  . Anemia   . Weakness   . Lower GI bleeding 08/07/2017  . Chronic systolic heart failure ([place]) 12/28/2016  . Tobacco use 12/28/2016  . NSTEMI (non-ST elevated myocardial infarction) ([place])   . Acute hypoxemic respiratory failure ([place]) 12/18/2016  . Sepsis ([place]) 12/18/2016  . CHF exacerbation ([place]) 12/04/2015  . PNA (pneumonia) 10/28/2015  . Chronic hepatitis C without  hepatic coma ([place]) 05/21/2014  . CAD (coronary artery disease) 07/19/2013  . Right arm pain 06/26/2013  . Cervical pain (neck) 03/28/2013  . Radiculopathy affecting upper extremity 03/28/2013  . Impaired renal function 12/21/2012  . Abnormal mammogram 12/20/2012  . Diabetes  mellitus type 2, uncontrolled ([place]) 12/20/2012    Orientation RESPIRATION BLADDER Height & Weight     Self  Normal Incontinent Weight: 220 lb 7.4 oz (100 kg) Height:  5' 5"  (165.1 cm)  BEHAVIORAL SYMPTOMS/MOOD NEUROLOGICAL BOWEL NUTRITION STATUS      Incontinent Diet (see dc summary)  AMBULATORY STATUS COMMUNICATION OF NEEDS Skin   Extensive Assist   PU Stage and Appropriate Care   PU Stage 2 Dressing:  (L Heel, L Medial Thigh, R Medial Thigh, Mid Sacrum  Stage II Foam dressings change PRN)                   Personal Care Assistance Level of Assistance    Bathing Assistance: Maximum assistance Feeding assistance: Maximum assistance Dressing Assistance: Maximum assistance     Functional Limitations Info  Sight, Hearing, Speech Sight Info: Impaired Hearing Info: Adequate Speech Info: Adequate    SPECIAL CARE FACTORS FREQUENCY  PT (By licensed PT), OT (By licensed OT)     PT Frequency: 3-5 x week OT Frequency: 3x week            Contractures Contractures Info: Present    Additional Factors Info    Code Status Info: DNR Allergies Info: Latex, Gabapentin           Current Medications (07/12/2019):  This is the current hospital active medication list Current Facility-Administered Medications  Medication Dose Route Frequency Provider Last Rate Last Admin  . 0.9 %  sodium chloride infusion (Manually program via Guardrails IV Fluids)   Intravenous Once Anders Simmonds, MD   Stopped at 07/10/19 [phone]  . 0.9 %  sodium chloride infusion   Intravenous PRN Gabriel Earing, PA-C   Stopped at 07/11/19 3149  . acetaminophen (TYLENOL) tablet 650 mg  650 mg Per Tube Q6H PRN Kipp Brood, MD   650 mg at 07/09/19 1803   Or  . acetaminophen (TYLENOL) suppository 650 mg  650 mg Rectal Q6H PRN Kipp Brood, MD      . atorvastatin (LIPITOR) tablet 40 mg  40 mg Oral Daily Noemi Chapel P, DO   40 mg at 07/12/19 0829  . Chlorhexidine Gluconate Cloth 2 % PADS 6 each  6 each  Topical Daily Rhyne, Hulen Shouts, PA-C   6 each at 07/12/19 1210  . docusate (COLACE) 50 MG/5ML liquid 100 mg  100 mg Oral Daily Noemi Chapel P, DO      . enoxaparin (LOVENOX) injection 30 mg  30 mg Subcutaneous Daily Domenic Polite, MD      . feeding supplement (PRO-STAT SUGAR FREE 64) liquid 30 mL  30 mL Oral Daily Noemi Chapel P, DO   30 mL at 07/12/19 0828  . folic acid (FOLVITE) tablet 1 mg  1 mg Oral Daily Julian Hy, DO   1 mg at 07/12/19 7026  . furosemide (LASIX) tablet 20 mg  20 mg Oral Daily Noemi Chapel P, DO   20 mg at 07/12/19 3785  . hydrocortisone cream 1 %   Topical BID Gabriel Earing, PA-C   Given at 07/12/19 8850  . insulin aspart (novoLOG) injection 0-15 Units  0-15 Units Subcutaneous Q4H Julian Hy, DO   3 Units at 07/11/19  1935  . lactulose (CHRONULAC) 10 GM/15ML solution 10 g  10 g Oral BID Domenic Polite, MD      . MEDLINE mouth rinse  15 mL Mouth Rinse BID Noemi Chapel P, DO   15 mL at 07/12/19 0958  . multivitamin with minerals tablet 1 tablet  1 tablet Oral Daily Julian Hy, DO   1 tablet at 07/12/19 2330  . mupirocin ointment (BACTROBAN) 2 %   Nasal BID Kipp Brood, MD   Given at 07/12/19 [phone]  . pantoprazole sodium (PROTONIX) 40 mg/20 mL oral suspension 40 mg  40 mg Oral Daily Noemi Chapel P, DO   40 mg at 07/12/19 2633     Discharge Medications: Please see discharge summary for a list of discharge medications.  Relevant Imaging Results:  Relevant Lab Results:   Additional Information SSN: [id number]   Referral sent to [facility] for Palliative follow up at [facility], LCSW

## 2019-07-12 NOTE — TOC Progression Note (Addendum)
Transition of Care [facility]) - Progression Note    Patient Details  Name: [name] MRN: [id number] Date of Birth: [birth year]  [facility] CM/SW Contact  Shade Flood, LCSW Phone Number: 07/12/2019, 10:24 AM  Clinical Narrative:     Received consult stating pt needs placement due to poor living situation at home. Reviewed pt's record today and discussed pt with Freda Munro from [facility]. Per Freda Munro, pt has been at [facility] since November of 2020 and they are planning to take pt back at dc.   TOC will follow and assist with dc planning as needed.  Received consult to refer for outpatient Palliative follow up at dc. Referral given to [facility] at [facility].  Expected Discharge Plan: Long Term Nursing Home Barriers to Discharge: Continued Medical Work up  Expected Discharge Plan and Services Expected Discharge Plan: [place]       Living arrangements for the past 2 months: [facility] Expected Discharge Date:  (unknown)                                     Social Determinants of Health (SDOH) Interventions    Readmission Risk Interventions Readmission Risk Prevention Plan 05/28/2019 02/26/2019 12/17/2018  Transportation Screening Complete Complete Complete  PCP or Specialist Appt within 5-7 Days - - -  Home Care Screening - - -  Medication Review (RN CM) - - -  HRI or [place] Work Consult for [place] Planning/Counseling - - -  SW consult not completed comments - - -  Palliative Care Screening - - -  Medication Review Press photographer) Complete Complete Referral to Pharmacy  PCP or Specialist appointment within 3-5 days of discharge - (No Data) Not Complete  PCP/Specialist Appt Not Complete comments - - DC date unknown but pt is established with providers  [place] or [place] Complete Complete Not Complete  HRI or Home Care Consult Pt Refusal Comments - - SNF resident  SW Recovery  Care/Counseling Consult Complete - Complete  Palliative Care Screening Not Applicable Not Applicable Not Complete  Comments - - pending need  [place] Complete Patient Refused Complete  Some recent data might be hidden

## 2019-07-13 LAB — IRON AND TIBC
Iron: 31 ug/dL (ref 28–170)
Saturation Ratios: 12 % (ref 10.4–31.8)
TIBC: 267 ug/dL (ref 250–450)
UIBC: 236 ug/dL

## 2019-07-13 LAB — COMPREHENSIVE METABOLIC PANEL
ALT: 13 U/L (ref 0–44)
AST: 17 U/L (ref 15–41)
Albumin: 2.1 g/dL — ABNORMAL LOW (ref 3.5–5.0)
Alkaline Phosphatase: 134 U/L — ABNORMAL HIGH (ref 38–126)
Anion gap: 11 (ref 5–15)
BUN: 45 mg/dL — ABNORMAL HIGH (ref 8–23)
CO2: 23 mmol/L (ref 22–32)
Calcium: 8.5 mg/dL — ABNORMAL LOW (ref 8.9–10.3)
Chloride: 110 mmol/L (ref 98–111)
Creatinine, Ser: 2.49 mg/dL — ABNORMAL HIGH (ref 0.44–1.00)
GFR calc Af Amer: 23 mL/min — ABNORMAL LOW (ref >60)
GFR calc non Af Amer: 20 mL/min — ABNORMAL LOW (ref >60)
Glucose, Bld: 117 mg/dL — ABNORMAL HIGH (ref 70–99)
Potassium: 4 mmol/L (ref 3.5–5.1)
Sodium: 144 mmol/L (ref 135–145)
Total Bilirubin: 1.2 mg/dL (ref 0.3–1.2)
Total Protein: 8.2 g/dL — ABNORMAL HIGH (ref 6.5–8.1)

## 2019-07-13 LAB — GLUCOSE, CAPILLARY
Glucose-Capillary: 107 mg/dL — ABNORMAL HIGH (ref 70–99)
Glucose-Capillary: 109 mg/dL — ABNORMAL HIGH (ref 70–99)
Glucose-Capillary: 109 mg/dL — ABNORMAL HIGH (ref 70–99)
Glucose-Capillary: 110 mg/dL — ABNORMAL HIGH (ref 70–99)
Glucose-Capillary: 120 mg/dL — ABNORMAL HIGH (ref 70–99)
Glucose-Capillary: 129 mg/dL — ABNORMAL HIGH (ref 70–99)
Glucose-Capillary: 150 mg/dL — ABNORMAL HIGH (ref 70–99)

## 2019-07-13 LAB — CBC
HCT: 26.1 % — ABNORMAL LOW (ref 36.0–46.0)
Hemoglobin: 7.6 g/dL — ABNORMAL LOW (ref 12.0–15.0)
MCH: 27.3 pg (ref 26.0–34.0)
MCHC: 29.1 g/dL — ABNORMAL LOW (ref 30.0–36.0)
MCV: 93.9 fL (ref 80.0–100.0)
Platelets: 128 10*3/uL — ABNORMAL LOW (ref 150–400)
RBC: 2.78 MIL/uL — ABNORMAL LOW (ref 3.87–5.11)
RDW: 23.6 % — ABNORMAL HIGH (ref 11.5–15.5)
WBC: 8.8 10*3/uL (ref 4.0–10.5)
nRBC: 0.5 % — ABNORMAL HIGH (ref 0.0–0.2)

## 2019-07-13 LAB — RETICULOCYTES
Immature Retic Fract: 42.6 % — ABNORMAL HIGH (ref 2.3–15.9)
RBC.: 2.78 MIL/uL — ABNORMAL LOW (ref 3.87–5.11)
Retic Count, Absolute: 110.9 10*3/uL (ref 19.0–186.0)
Retic Ct Pct: 4 % — ABNORMAL HIGH (ref 0.4–3.1)

## 2019-07-13 LAB — FOLATE: Folate: 46.4 ng/mL (ref >5.9)

## 2019-07-13 LAB — VITAMIN B12: Vitamin B-12: 1457 pg/mL — ABNORMAL HIGH (ref 180–914)

## 2019-07-13 LAB — FERRITIN: Ferritin: 145 ng/mL (ref 11–307)

## 2019-07-13 MED ORDER — LACTULOSE 10 GM/15ML PO SOLN
10.0000 g | Freq: Every day | ORAL | Status: DC
  Administered 2019-07-14: 10 g via ORAL
  Filled 2019-07-13 (×2): qty 15

## 2019-07-13 NOTE — Progress Notes (Signed)
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 [facility] course complicated by encephalopathy, has been treated for hepatic encephalopathy with lactulose -Also noted to have dysphagia requiring NG tube, tube feeds -Prognosis felt to be very poor, palliative medicine team was consulted by critical care MD -Also treated for postop blood loss anemia and hemorrhagic shock -She was transferred from [facility] to [facility] service today 6/25 -In addition recently admitted to [facility] from 5/9 through 5/24 group C strep bacteremia, she had a TEE at the time which ruled out prosthetic valve endocarditis   Assessment & Plan:   Severe sepsis Right foot abscess, myositis -Status 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 intake remains poor  Hemorrhagic shock Postop bleeding -Appears to have resolved -Transfused 7 units of blood this admission -Hemoglobin slightly lower at 7.6 today -Check anemia panel, monitor, no overt bleeding at this time  Acute on chronic respiratory  failure COPD  -Prolonged mechanical ventilation from 6/17 through 6/22 -Maintain euvolemia, continue Lasix p.o. -Continue BiPAP nightly and daytime naps  AKI on CKD 3B -Creatinine relatively stable now in the 2.5 range, monitor  Hepatic encephalopathy Hepatitis C Liver cirrhosis -Continue lactulose, decrease dose given worsening diarrhea -Also stop laxatives  Chronic diastolic CHF -Appears euvolemic, continue low-dose p.o. Lasix  Type 2 diabetes mellitus -CBGs are stable, continue sliding scale insulin  Ethics, chronic debility, multiple serious medical problems including vascular disease, CAD, COPD, OSA, CKD, liver cirrhosis, multiple frequent hospitalizations, high risk of ongoing decline -Palliative medicine consulted and following, now DNR  DVT prophylaxis: Add Lovenox Code Status: DNR Family Communication: No family at bedside, will attempt to contact spouse Disposition Plan:  Status is: Inpatient  Remains inpatient appropriate because:Inpatient level of care appropriate due to severity of illness   Dispo: The patient is from: SNF              Anticipated d/c is to: SNF              Anticipated d/c date is: 3 days              Patient currently is not medically stable to d/c. Consultants:   PCCM transfer, vascular surgery, palliative medicine   Procedures: Right above-knee amputation 6/17  Antimicrobials:    Subjective: -Still with some diarrhea but overall improving, mental status little better overall, oral intake remains poor  Objective: Vitals:   07/13/19 0116 07/13/19 0432 07/13/19 0726 07/13/19 1225  BP:  (!) 143/77 (!) 145/80 (!) 160/85  Pulse: 90 92 90 98  Resp:  19 18 18   Temp:  (!) 97.5 F (36.4 C) 98.2 F (36.8 C) 98.1 F (36.7 C)  TempSrc:  Oral  Oral Oral  SpO2: 90% 94% 90% (!) 84%  Weight:  99.2 kg    Height:        Intake/Output Summary (Last 24 hours) at 07/13/2019 1244 Last data filed at 07/13/2019 0900 Gross per 24 hour  Intake  700 ml  Output 550 ml  Net 150 ml   Filed Weights   07/10/19 0500 07/12/19 0039 07/13/19 0432  Weight: 100 kg 100 kg 99.2 kg    Examination:  General exam: Morbidly obese chronically ill female, appears much older than stated age, awake alert oriented to self and partly to place only CVS: S1-S2, /RRR Lungs: Clear anteriorly, decreased at the bases Abdomen soft, obese, nontender, bowel sounds present Extremities, right below-knee amputation with sutures, ischemic changes in her left foot as well and amputation of left great toe skin: As above no rashes on exposed skin  psychiatry: Poor insight and judgment  Data Reviewed:   CBC: Recent Labs  Lab 07/10/19 0257 07/10/19 2253 07/11/19 0242 07/12/19 0557 07/13/19 0559  WBC 11.7* 10.6* 10.3 10.3 8.8  NEUTROABS 8.7*  --   --   --   --   HGB 5.2* 8.1* 8.0* 8.6* 7.6*  HCT 18.4* 26.1* 26.5* 28.6* 26.1*  MCV 88.9 88.8 89.5 91.1 93.9  PLT 105* 99* 101* 118* 454*   Basic Metabolic Panel: Recent Labs  Lab 07/09/19 0947 07/10/19 0257 07/11/19 0242 07/12/19 0801 07/13/19 0559  NA 149* 149* 148* 142 144  K 3.7 3.8 4.4 3.9 4.0  CL 119* 117* 116* 109 110  CO2 22 21* 22 22 23   GLUCOSE 204* 135* 173* 118* 117*  BUN 45* 46* 47* 45* 45*  CREATININE 2.62* 2.62* 2.52* 2.52* 2.49*  CALCIUM 7.9* 7.8* 8.0* 8.0* 8.5*  MG 1.7  --   --   --   --   PHOS 1.9* 2.4*  --   --   --    GFR: Estimated Creatinine Clearance: 26.6 mL/min (A) (by C-G formula based on SCr of 2.49 mg/dL (H)). Liver Function Tests: Recent Labs  Lab 07/08/19 1108 07/09/19 0947 07/13/19 0559  AST 14* 11* 17  ALT 9 9 13   ALKPHOS 110 90 134*  BILITOT 0.9 1.0 1.2  PROT 7.7 7.0 8.2*  ALBUMIN 1.6* 1.5* 2.1*   No results for input(s): LIPASE, AMYLASE in the last 168 hours. No results for input(s): AMMONIA in the last 168 hours. Coagulation Profile: Recent Labs  Lab 07/10/19 0257 07/10/19 2253  INR 1.4* 1.2   Cardiac Enzymes: No results for input(s):  CKTOTAL, CKMB, CKMBINDEX, TROPONINI in the last 168 hours. BNP (last 3 results) No results for input(s): PROBNP in the last 8760 hours. HbA1C: No results for input(s): HGBA1C in the last 72 hours. CBG: Recent Labs  Lab 07/12/19 2046 07/13/19 0106 07/13/19 0429 07/13/19 0723 07/13/19 1154  GLUCAP 141* 109* 110* 109* 150*   Lipid Profile: No results for input(s): CHOL, HDL, LDLCALC, TRIG, CHOLHDL, LDLDIRECT in the last 72 hours. Thyroid Function Tests: No results for input(s): TSH, T4TOTAL, FREET4, T3FREE, THYROIDAB in the last 72 hours. Anemia Panel: No results for input(s): VITAMINB12, FOLATE, FERRITIN, TIBC, IRON, RETICCTPCT in the last 72 hours. Urine analysis:    Component Value Date/Time   COLORURINE YELLOW 06/27/2019 [place] 06/27/2019 1242   APPEARANCEUR Clear 08/03/2013 2138   LABSPEC 1.011 06/27/2019 1242   LABSPEC 1.028 08/03/2013 2138   PHURINE 5.0 06/27/2019 1242   GLUCOSEU NEGATIVE 06/27/2019 1242   GLUCOSEU Negative 08/03/2013  2138   HGBUR NEGATIVE 06/27/2019 1242   BILIRUBINUR NEGATIVE 06/27/2019 1242   BILIRUBINUR Negative 08/03/2013 2138   KETONESUR NEGATIVE 06/27/2019 1242   PROTEINUR NEGATIVE 06/27/2019 1242   UROBILINOGEN 0.2 09/27/2006 2132   NITRITE NEGATIVE 06/27/2019 1242   LEUKOCYTESUR NEGATIVE 06/27/2019 1242   LEUKOCYTESUR Negative 08/03/2013 2138   Sepsis Labs: @LABRCNTIP (procalcitonin:4,lacticidven:4)  ) Recent Results (from the past 240 hour(s))  Surgical pcr screen     Status: Abnormal   Collection Time: 07/04/19  8:51 AM   Specimen: Nasal Mucosa; Nasal Swab  Result Value Ref Range Status   MRSA, PCR POSITIVE (A) NEGATIVE Final    Comment: RESULT CALLED TO, READ BACK BY AND VERIFIED WITH: T. Tijani RN 11:25 07/04/19 ([email or web address])    Staphylococcus aureus POSITIVE (A) NEGATIVE Final    Comment: (NOTE) The Xpert SA Assay (FDA approved for NASAL specimens in patients 72 years of age and older), is one component of a  comprehensive surveillance program. It is not intended to diagnose infection nor to guide or monitor treatment. Performed at [facility], [address] [place], [place] [address]      Scheduled Meds: . sodium chloride   Intravenous Once  . atorvastatin  40 mg Oral Daily  . docusate  100 mg Oral Daily  . enoxaparin (LOVENOX) injection  30 mg Subcutaneous Daily  . feeding supplement (ENSURE ENLIVE)  237 mL Oral TID BM  . feeding supplement (PRO-STAT SUGAR FREE 64)  30 mL Oral TID  . folic acid  1 mg Oral Daily  . furosemide  20 mg Oral Daily  . hydrocortisone cream   Topical BID  . insulin aspart  0-15 Units Subcutaneous Q4H  . lactulose  10 g Oral BID  . mouth rinse  15 mL Mouth Rinse BID  . multivitamin with minerals  1 tablet Oral Daily  . pantoprazole sodium  40 mg Oral Daily   Continuous Infusions: . sodium chloride Stopped (07/11/19 0608)     LOS: 16 days    Time spent: 63mn  PDomenic Polite MD [facility]  07/13/2019, 12:44 PM

## 2019-07-13 NOTE — TOC Progression Note (Signed)
Transition of Care [facility]) - Progression Note    Patient Details  Name: [name] MRN: [id number] Date of Birth: 09/21/1955  Transition of Care [facility]) CM/SW [place], [place] Phone Number: [phone] 07/13/2019, 4:26 PM  Clinical Narrative:     CSW was alerted that patient's husband does not want her to return to [facility]. TOC team will follow up with patient's husband.  Expected Discharge Plan: Long Term Nursing Home Barriers to Discharge: Continued Medical Work up  Expected Discharge Plan and Services Expected Discharge Plan: [place]       Living arrangements for the past 2 months: [place] Expected Discharge Date:  (unknown)                                     Social Determinants of Health (SDOH) Interventions    Readmission Risk Interventions Readmission Risk Prevention Plan 05/28/2019 02/26/2019 12/17/2018  Transportation Screening Complete Complete Complete  PCP or Specialist Appt within 5-7 Days - - -  Home Care Screening - - -  Medication Review (RN CM) - - -  HRI or [place] Work Consult for [place] Planning/Counseling - - -  SW consult not completed comments - - -  Palliative Care Screening - - -  Medication Review Press photographer) Complete Complete Referral to Pharmacy  PCP or Specialist appointment within 3-5 days of discharge - (No Data) Not Complete  PCP/Specialist Appt Not Complete comments - - DC date unknown but pt is established with providers  [place] or [place] Complete Complete Not Complete  HRI or Home Care Consult Pt Refusal Comments - - SNF resident  SW Recovery Care/Counseling Consult Complete - Complete  Palliative Care Screening Not Applicable Not Applicable Not Complete  Comments - - pending need  [place] Complete Patient Refused Complete  Some recent data might be hidden

## 2019-07-13 NOTE — Progress Notes (Signed)
Patient does not wish to wear CPAP tonight. RT will continue to monitor.

## 2019-07-13 NOTE — Plan of Care (Signed)
  Problem: Activity: Goal: Risk for activity intolerance will decrease Outcome: Progressing   Problem: Elimination: Goal: Will not experience complications related to urinary retention Outcome: Progressing

## 2019-07-13 NOTE — Progress Notes (Signed)
Patient removed IV earlier in shift.  IV team consulted.  Due to fact pt is not receiving any IV medications, the s/l is deemed not necessary as part of treatment plan and not in patient's best interest.

## 2019-07-13 NOTE — Progress Notes (Signed)
   Palliative Medicine Inpatient Follow Up Note   HPI: 64 y.o. female  with past medical history of chronic systolic heart failure, COPD, DM2, DOE, OSA, Hep C, other cirrhosis, CKD III, lower extremity cellulitis, lower extremity venous stasis ulcers,  admitted on 06/27/2019 with complaints of altered mental status. Workup reveals metabolic encephalopathy in the setting of infectious process related to lower extremity cellulitis, myositis, osteoitis. Vascular surgery consulted and recommend BKA. Spouse initially has not consented for surgery. Palliative consulted for [place].    Patient since that time underwent a AKA and since has not been able to tolerate ventilatory weaning. We were asked to speak to patients husband about goals of care moving forward being that she will likely get extubated though there will most probably be another event causing hospitalization given her comorbid conditions.  Today's Discussion (07/13/2019): Chart reviewed. Patient remains on 2LPM [place]. Not noted to be in any distress. Has been eating 25-75% of meals.  Called patients husband, Kela Millin. He states frustration that the medical team has discussed discharge with him. I shared with him that Lenor cannot stay in the acute care setting indefinitely. We discussed that he absolutely does not want her to go back to [facility]. I shared with him that I will communicate with social work regarding this.  Kela Millin remains unable to have a face to face meeting due to ongoing transportation issues.   Discussed the importance of continued conversation with family and their  medical providers regarding overall plan of care and treatment options, ensuring decisions are within the context of the patients values and GOCs.  Questions and concerns addressed   SUMMARY OF RECOMMENDATIONS - DNR/DNI - Continue to try to arrange a meeting with Kela Millin, he has been unable to come d/t transportation problems - TOC --> Patients husband does not  want her to go back to [facility] - Outpatient palliative follow up   Time Spent: 25 minutes Greater than 50% of the time was spent in counseling and coordination of care ______________________________________________________________________________________ [place] Team Team Cell Phone: [phone] Please utilize secure chat with additional questions, if there is no response within 30 minutes please call the above phone number  Palliative Medicine Team providers are available by phone from 7am to 7pm daily and can be reached through the team cell phone.  Should this patient require assistance outside of these hours, please call the patient's attending physician.

## 2019-07-14 LAB — CBC
HCT: 27.3 % — ABNORMAL LOW (ref 36.0–46.0)
Hemoglobin: 7.9 g/dL — ABNORMAL LOW (ref 12.0–15.0)
MCH: 27.8 pg (ref 26.0–34.0)
MCHC: 28.9 g/dL — ABNORMAL LOW (ref 30.0–36.0)
MCV: 96.1 fL (ref 80.0–100.0)
Platelets: 141 10*3/uL — ABNORMAL LOW (ref 150–400)
RBC: 2.84 MIL/uL — ABNORMAL LOW (ref 3.87–5.11)
RDW: 23.8 % — ABNORMAL HIGH (ref 11.5–15.5)
WBC: 8.7 10*3/uL (ref 4.0–10.5)
nRBC: 0.5 % — ABNORMAL HIGH (ref 0.0–0.2)

## 2019-07-14 LAB — GLUCOSE, CAPILLARY
Glucose-Capillary: 98 mg/dL (ref 70–99)
Glucose-Capillary: 95 mg/dL (ref 70–99)
Glucose-Capillary: 129 mg/dL — ABNORMAL HIGH (ref 70–99)
Glucose-Capillary: 146 mg/dL — ABNORMAL HIGH (ref 70–99)
Glucose-Capillary: 177 mg/dL — ABNORMAL HIGH (ref 70–99)

## 2019-07-14 LAB — BASIC METABOLIC PANEL
Anion gap: 11 (ref 5–15)
BUN: 45 mg/dL — ABNORMAL HIGH (ref 8–23)
CO2: 24 mmol/L (ref 22–32)
Calcium: 8.6 mg/dL — ABNORMAL LOW (ref 8.9–10.3)
Chloride: 110 mmol/L (ref 98–111)
Creatinine, Ser: 2.35 mg/dL — ABNORMAL HIGH (ref 0.44–1.00)
GFR calc Af Amer: 25 mL/min — ABNORMAL LOW (ref >60)
GFR calc non Af Amer: 21 mL/min — ABNORMAL LOW (ref >60)
Glucose, Bld: 106 mg/dL — ABNORMAL HIGH (ref 70–99)
Potassium: 4.4 mmol/L (ref 3.5–5.1)
Sodium: 145 mmol/L (ref 135–145)

## 2019-07-14 MED ORDER — SODIUM CHLORIDE 0.9 % IV SOLN
510.0000 mg | Freq: Once | INTRAVENOUS | Status: AC
  Administered 2019-07-14: 510 mg via INTRAVENOUS
  Filled 2019-07-14: qty 17

## 2019-07-14 NOTE — Progress Notes (Signed)
° °  Palliative Medicine Inpatient Follow Up Note   HPI: 64 y.o. female  with past medical history of chronic systolic heart failure, COPD, DM2, DOE, OSA, Hep C, other cirrhosis, CKD III, lower extremity cellulitis, lower extremity venous stasis ulcers,  admitted on 06/27/2019 with complaints of altered mental status. Workup reveals metabolic encephalopathy in the setting of infectious process related to lower extremity cellulitis, myositis, osteoitis. Vascular surgery consulted and recommend BKA. Spouse initially has not consented for surgery. Palliative consulted for [place].    Patient since that time underwent a AKA and since has not been able to tolerate ventilatory weaning. We were asked to speak to patients husband about goals of care moving forward being that she will likely get extubated though there will most probably be another event causing hospitalization given her comorbid conditions.  Today's Discussion (07/14/2019): Chart reviewed. Patient remains on 2LPM [place]. Noted to be eating well. Was lying comfortably in bed this morning.   Patients husband, Kela Millin called and updated. We discussed the plan for her to ideally transition to a different rehabilitation. Kela Millin remains unable to come to the hospital due to the lack of transit and debility.   Discussed the importance of continued conversation with family and their  medical providers regarding overall plan of care and treatment options, ensuring decisions are within the context of the patients values and GOCs.  Questions and concerns addressed   SUMMARY OF RECOMMENDATIONS - DNR/DNI - TOC --> Patients husband does not want her to go back to [facility] - Outpatient palliative follow up   Time Spent: 15 minutes Greater than 50% of the time was spent in counseling and coordination of care ______________________________________________________________________________________ [place] Team Team  Cell Phone: [phone] Please utilize secure chat with additional questions, if there is no response within 30 minutes please call the above phone number  Palliative Medicine Team providers are available by phone from 7am to 7pm daily and can be reached through the team cell phone.  Should this patient require assistance outside of these hours, please call the patient's attending physician.

## 2019-07-14 NOTE — Progress Notes (Signed)
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 [facility] course complicated by encephalopathy, has been treated for hepatic encephalopathy with lactulose -Also noted to have dysphagia requiring NG tube, tube feeds -Prognosis felt to be very poor, palliative medicine team was consulted by critical care MD -Also treated for postop blood loss anemia and hemorrhagic shock -She was transferred from [facility] to [facility] service  6/25 -In addition recently admitted to [facility] from 5/9 through 5/24 group C strep bacteremia, she had a TEE at the time which ruled out prosthetic valve endocarditis   Assessment & Plan:   Severe sepsis Right foot abscess, myositis -Status 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 intake remains poor  Hemorrhagic shock Postop bleeding -Appears to have resolved -Transfused 7 units of blood this admission -Hemoglobin stable, between 7.5 and 8, anemia panel with iron deficiency, will give IV iron x1 today -CBC in a.m.  Acute  on chronic respiratory failure COPD  -Prolonged mechanical ventilation from 6/17 through 6/22 -Maintain euvolemia, continue Lasix p.o. -Continue BiPAP nightly and daytime naps  AKI on CKD 3B -Creatinine relatively stable now in the 2.5 range, monitor  Hepatic encephalopathy Hepatitis C Liver cirrhosis -Continue lactulose, decrease dose given worsening diarrhea -Overall poor prognosis  Chronic diastolic CHF -Appears euvolemic, continue low-dose p.o. Lasix  Type 2 diabetes mellitus -CBGs are stable, continue sliding scale insulin  Ethics, chronic debility, multiple serious medical problems including vascular disease, CAD, COPD, OSA, CKD, liver cirrhosis, multiple frequent hospitalizations, high risk of ongoing decline -Palliative medicine consulted and following, now DNR  DVT prophylaxis: Lovenox Code Status: DNR Family Communication: No family at bedside, will attempt to contact spouse Disposition Plan:  Status is: Inpatient  Remains inpatient appropriate because:Inpatient level of care appropriate due to severity of illness   Dispo: The patient is from: SNF              Anticipated d/c is to: SNF              Anticipated d/c date is: ? 1-2days              Patient currently is not medically stable to d/c. Consultants:   PCCM transfer, vascular surgery, palliative medicine   Procedures: Right above-knee amputation 6/17  Antimicrobials:    Subjective: -Feels weak, diarrhea overall is improving, p.o. intake remains poor but overall a little better Objective: Vitals:   07/13/19 2359 07/14/19 0308 07/14/19 0728 07/14/19 1153  BP: (!) 151/82 (!) 151/87 (!) 157/88 139/72  Pulse: 87 91 81 81  Resp: 19 19 18 18   Temp: (!) 97.4 F (36.3 C) 97.6 F (36.4 C) 98.2 F (36.8 C) 97.7 F (  36.5 C)  TempSrc: Oral Oral Oral Oral  SpO2: 100% 100% 100% 100%  Weight:  96.7 kg    Height:        Intake/Output Summary (Last 24 hours) at 07/14/2019 1352 Last data filed at  07/14/2019 1300 Gross per 24 hour  Intake 720 ml  Output 450 ml  Net 270 ml   Filed Weights   07/12/19 0039 07/13/19 0432 07/14/19 0308  Weight: 100 kg 99.2 kg 96.7 kg    Examination:  General exam: Morbidly obese chronically ill female, appears much older than stated age, awake alert oriented to self and partly to place only CVS: S1-S2, regular rate rhythm Lungs: Clear anteriorly, decreased at the bases Abdomen: Soft, obese, nontender, bowel sounds present Extremities, above-knee amputation site with sutures, ischemic changes in her left foot and amputation of left great toe  Skin: As above, no rashes on exposed skin Psych: Poor insight and judgment  Data Reviewed:   CBC: Recent Labs  Lab 07/10/19 0257 07/10/19 0257 07/10/19 2253 07/11/19 0242 07/12/19 0557 07/13/19 0559 07/14/19 0312  WBC 11.7*   < > 10.6* 10.3 10.3 8.8 8.7  NEUTROABS 8.7*  --   --   --   --   --   --   HGB 5.2*   < > 8.1* 8.0* 8.6* 7.6* 7.9*  HCT 18.4*   < > 26.1* 26.5* 28.6* 26.1* 27.3*  MCV 88.9   < > 88.8 89.5 91.1 93.9 96.1  PLT 105*   < > 99* 101* 118* 128* 141*   < > = values in this interval not displayed.   Basic Metabolic Panel: Recent Labs  Lab 07/09/19 0947 07/09/19 0947 07/10/19 0257 07/11/19 0242 07/12/19 0801 07/13/19 0559 07/14/19 0312  NA 149*   < > 149* 148* 142 144 145  K 3.7   < > 3.8 4.4 3.9 4.0 4.4  CL 119*   < > 117* 116* 109 110 110  CO2 22   < > 21* 22 22 23 24   GLUCOSE 204*   < > 135* 173* 118* 117* 106*  BUN 45*   < > 46* 47* 45* 45* 45*  CREATININE 2.62*   < > 2.62* 2.52* 2.52* 2.49* 2.35*  CALCIUM 7.9*   < > 7.8* 8.0* 8.0* 8.5* 8.6*  MG 1.7  --   --   --   --   --   --   PHOS 1.9*  --  2.4*  --   --   --   --    < > = values in this interval not displayed.   GFR: Estimated Creatinine Clearance: 27.8 mL/min (A) (by C-G formula based on SCr of 2.35 mg/dL (H)). Liver Function Tests: Recent Labs  Lab 07/08/19 1108 07/09/19 0947 07/13/19 0559  AST 14* 11*  17  ALT 9 9 13   ALKPHOS 110 90 134*  BILITOT 0.9 1.0 1.2  PROT 7.7 7.0 8.2*  ALBUMIN 1.6* 1.5* 2.1*   No results for input(s): LIPASE, AMYLASE in the last 168 hours. No results for input(s): AMMONIA in the last 168 hours. Coagulation Profile: Recent Labs  Lab 07/10/19 0257 07/10/19 2253  INR 1.4* 1.2   Cardiac Enzymes: No results for input(s): CKTOTAL, CKMB, CKMBINDEX, TROPONINI in the last 168 hours. BNP (last 3 results) No results for input(s): PROBNP in the last 8760 hours. HbA1C: No results for input(s): HGBA1C in the last 72 hours. CBG: Recent Labs  Lab 07/13/19 1958 07/13/19 2358 07/14/19 0420 07/14/19  6761 07/14/19 1150  GLUCAP 129* 107* 98 95 129*   Lipid Profile: No results for input(s): CHOL, HDL, LDLCALC, TRIG, CHOLHDL, LDLDIRECT in the last 72 hours. Thyroid Function Tests: No results for input(s): TSH, T4TOTAL, FREET4, T3FREE, THYROIDAB in the last 72 hours. Anemia Panel: Recent Labs    07/13/19 0559 07/13/19 1306  VITAMINB12 1,457*  --   FOLATE  --  46.4  FERRITIN 145  --   TIBC 267  --   IRON 31  --   RETICCTPCT 4.0*  --    Urine analysis:    Component Value Date/Time   COLORURINE YELLOW 06/27/2019 [place] 06/27/2019 1242   APPEARANCEUR Clear 08/03/2013 2138   LABSPEC 1.011 06/27/2019 1242   LABSPEC 1.028 08/03/2013 2138   PHURINE 5.0 06/27/2019 1242   GLUCOSEU NEGATIVE 06/27/2019 1242   GLUCOSEU Negative 08/03/2013 2138   HGBUR NEGATIVE 06/27/2019 1242   BILIRUBINUR NEGATIVE 06/27/2019 1242   BILIRUBINUR Negative 08/03/2013 2138   KETONESUR NEGATIVE 06/27/2019 1242   PROTEINUR NEGATIVE 06/27/2019 1242   UROBILINOGEN 0.2 09/27/2006 2132   NITRITE NEGATIVE 06/27/2019 1242   LEUKOCYTESUR NEGATIVE 06/27/2019 1242   LEUKOCYTESUR Negative 08/03/2013 2138   Sepsis Labs: @LABRCNTIP (procalcitonin:4,lacticidven:4)  ) No results found for this or any previous visit (from the past 240 hour(s)).   Scheduled Meds: .  sodium chloride   Intravenous Once  . atorvastatin  40 mg Oral Daily  . enoxaparin (LOVENOX) injection  30 mg Subcutaneous Daily  . feeding supplement (PRO-STAT SUGAR FREE 64)  30 mL Oral TID  . folic acid  1 mg Oral Daily  . furosemide  20 mg Oral Daily  . hydrocortisone cream   Topical BID  . insulin aspart  0-15 Units Subcutaneous Q4H  . lactulose  10 g Oral Daily  . mouth rinse  15 mL Mouth Rinse BID  . multivitamin with minerals  1 tablet Oral Daily  . pantoprazole sodium  40 mg Oral Daily   Continuous Infusions: . sodium chloride Stopped (07/11/19 9509)     LOS: 17 days    Time spent: 69mn  PDomenic Polite MD [facility]  07/14/2019, 1:52 PM

## 2019-07-14 NOTE — Progress Notes (Signed)
Pt. O2 saturation 86%. Pt. Refusing nasal cannula. On call for [facility] paged to make aware. Will attempt venti mask.

## 2019-07-14 NOTE — Plan of Care (Signed)
  Problem: Safety: Goal: Ability to remain free from injury will improve Outcome: Not Progressing  Pt. Confused.

## 2019-07-14 NOTE — Progress Notes (Signed)
Pt refusing CPAP for tonight 

## 2019-07-15 ENCOUNTER — Inpatient Hospital Stay (HOSPITAL_COMMUNITY): Payer: Medicaid Other

## 2019-07-15 ENCOUNTER — Telehealth (HOSPITAL_COMMUNITY): Payer: Self-pay

## 2019-07-15 LAB — GLUCOSE, CAPILLARY
Glucose-Capillary: 135 mg/dL — ABNORMAL HIGH (ref 70–99)
Glucose-Capillary: 121 mg/dL — ABNORMAL HIGH (ref 70–99)
Glucose-Capillary: 125 mg/dL — ABNORMAL HIGH (ref 70–99)
Glucose-Capillary: 130 mg/dL — ABNORMAL HIGH (ref 70–99)

## 2019-07-15 LAB — BASIC METABOLIC PANEL
Anion gap: 7 (ref 5–15)
BUN: 48 mg/dL — ABNORMAL HIGH (ref 8–23)
CO2: 24 mmol/L (ref 22–32)
Calcium: 8.7 mg/dL — ABNORMAL LOW (ref 8.9–10.3)
Chloride: 115 mmol/L — ABNORMAL HIGH (ref 98–111)
Creatinine, Ser: 2.45 mg/dL — ABNORMAL HIGH (ref 0.44–1.00)
GFR calc Af Amer: 23 mL/min — ABNORMAL LOW (ref >60)
GFR calc non Af Amer: 20 mL/min — ABNORMAL LOW (ref >60)
Glucose, Bld: 164 mg/dL — ABNORMAL HIGH (ref 70–99)
Potassium: 4.6 mmol/L (ref 3.5–5.1)
Sodium: 146 mmol/L — ABNORMAL HIGH (ref 135–145)

## 2019-07-15 LAB — CBC
HCT: 26.1 % — ABNORMAL LOW (ref 36.0–46.0)
Hemoglobin: 7.4 g/dL — ABNORMAL LOW (ref 12.0–15.0)
MCH: 27.6 pg (ref 26.0–34.0)
MCHC: 28.4 g/dL — ABNORMAL LOW (ref 30.0–36.0)
MCV: 97.4 fL (ref 80.0–100.0)
Platelets: 132 10*3/uL — ABNORMAL LOW (ref 150–400)
RBC: 2.68 MIL/uL — ABNORMAL LOW (ref 3.87–5.11)
RDW: 23.8 % — ABNORMAL HIGH (ref 11.5–15.5)
WBC: 10.8 10*3/uL — ABNORMAL HIGH (ref 4.0–10.5)
nRBC: 0 % (ref 0.0–0.2)

## 2019-07-15 LAB — AMMONIA: Ammonia: 57 umol/L — ABNORMAL HIGH (ref 9–35)

## 2019-07-15 MED ORDER — ALBUTEROL SULFATE (2.5 MG/3ML) 0.083% IN NEBU
INHALATION_SOLUTION | RESPIRATORY_TRACT | Status: AC
  Filled 2019-07-15: qty 3

## 2019-07-15 MED ORDER — INSULIN ASPART 100 UNIT/ML ~~LOC~~ SOLN
0.0000 [IU] | Freq: Three times a day (TID) | SUBCUTANEOUS | Status: DC
  Administered 2019-07-15 – 2019-07-16 (×6): 2 [IU] via SUBCUTANEOUS
  Administered 2019-07-17: 3 [IU] via SUBCUTANEOUS
  Administered 2019-07-17 – 2019-07-21 (×6): 2 [IU] via SUBCUTANEOUS
  Administered 2019-07-21 (×2): 3 [IU] via SUBCUTANEOUS
  Administered 2019-07-22: 2 [IU] via SUBCUTANEOUS
  Administered 2019-07-22: 5 [IU] via SUBCUTANEOUS
  Administered 2019-07-22 – 2019-07-23 (×2): 2 [IU] via SUBCUTANEOUS
  Administered 2019-07-23 – 2019-07-24 (×3): 3 [IU] via SUBCUTANEOUS

## 2019-07-15 MED ORDER — LACTULOSE 10 GM/15ML PO SOLN
20.0000 g | Freq: Two times a day (BID) | ORAL | Status: DC
  Administered 2019-07-15 – 2019-07-24 (×19): 20 g via ORAL
  Filled 2019-07-15 (×22): qty 30

## 2019-07-15 NOTE — Progress Notes (Signed)
Even after frequent rounds due to pt's confusion, pt pulled her IV again and blood was all over the floor and her gown. Pt's right AKA site was compromised as well. One of her stitch came open and dark brown substances was coming out of stitch site. Notified MD about this incident. Was told that Vascular team will check the patient tomorrow.

## 2019-07-15 NOTE — Progress Notes (Signed)
Pt was having respiratory distress after removing oxygen from her nares. Pt was trying not to put the oxygen back on. SpO2 went down to mid 70's. Pt became more short of breath and heart rate went up to 120-130's. After putting her oxygen back on her with additional assitance, her vitals became stable. Got an order of bilateral soft restraints for pt's safety from MD.

## 2019-07-15 NOTE — Progress Notes (Signed)
Physical Therapy Treatment Patient Details Name: BRANDALYNN OFALLON MRN: [id number] DOB: 08-24-1955 Today's Date: 07/15/2019    History of Present Illness Keandrea Tapley is a 64yo f with a PMHx of diastolic CHF with anasarca, NSTEMI, COPD, DMII uncontrolled, anemia, CKD IIIB, hepatitis C with cirrhosis who presented for altered mental status. 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 She was unable to be extubated following surgery. CCM was consulted for ventilator management.  Extubated 6/22.    PT Comments    Pt admitted with above diagnosis. Performed Anterior posterior transfer to recliner from bed with max to total assist of 3 with use of pad.  Pt looked great once in recliner and began to feed herself lunch.  Will continue to progress pt as able.  Pt currently with functional limitations due to balacne and endurance deficits. Pt will benefit from skilled PT to increase their independence and safety with mobility to allow discharge to the venue listed below.     Follow Up Recommendations  SNF     Equipment Recommendations  Other (comment) (TBA)    Recommendations for Other Services       Precautions / Restrictions Precautions Precautions: Fall Restrictions Weight Bearing Restrictions: No    Mobility  Bed Mobility Overal bed mobility: Needs Assistance Bed Mobility: Rolling;Supine to Sit;Sit to Supine Rolling: Mod assist   Supine to sit: Mod assist;+2 for physical assistance Sit to supine: Mod assist;+2 for physical assistance   General bed mobility comments: cues for direction, assist for comeing to long sitting position. Difficult to get pt to stay in position.   Transfers Overall transfer level: Needs assistance   Transfers: Comptroller transfers: Total assist;Max assist;+2 physical assistance;From elevated surface (3rd person for safety)   General  transfer comment: Had two people assisting pt to scoot posteriorly with use of pad to scoot and the 3rd person was in front of pt holding pts UEs to maintain pts sitting balance and assisting with pt leaning forward to unweight hips for transfer.   Ambulation/Gait             General Gait Details: unable to attempt   Stairs             Wheelchair Mobility    Modified Rankin (Stroke Patients Only)       Balance   Sitting-balance support: Feet unsupported;Bilateral upper extremity supported Sitting balance-Leahy Scale: Zero Sitting balance - Comments: Pt long sitting in bed with mod to max assist                                    Cognition Arousal/Alertness: Awake/alert Behavior During Therapy: WFL for tasks assessed/performed Overall Cognitive Status: Impaired/Different from baseline Area of Impairment: Orientation;Attention;Memory;Following commands;Safety/judgement;Awareness;Problem solving                 Orientation Level: Place;Time;Situation Current Attention Level: Focused Memory: Decreased short-term memory Following Commands: Follows one step commands inconsistently;Follows one step commands with increased time Safety/Judgement: Decreased awareness of safety Awareness: Intellectual Problem Solving: Slow processing        Exercises General Exercises - Lower Extremity Quad Sets: AROM;Both;5 reps;Supine    General Comments General comments (skin integrity, edema, etc.): VSS      Pertinent Vitals/Pain Pain Assessment: Faces Faces Pain Scale: Hurts a little bit Pain  Location: bilateral LE's and UE's Pain Descriptors / Indicators: Aching;Tightness;Sharp Pain Intervention(s): Limited activity within patient's tolerance;Monitored during session;Repositioned    Home Living                      Prior Function            PT Goals (current goals can now be found in the care plan section) Progress towards PT goals:  Progressing toward goals    Frequency    Min 2X/week      PT Plan Current plan remains appropriate    Co-evaluation              AM-PAC PT "6 Clicks" Mobility   Outcome Measure  Help needed turning from your back to your side while in a flat bed without using bedrails?: Total Help needed moving from lying on your back to sitting on the side of a flat bed without using bedrails?: Total Help needed moving to and from a bed to a chair (including a wheelchair)?: Total Help needed standing up from a chair using your arms (e.g., wheelchair or bedside chair)?: Total Help needed to walk in hospital room?: Total Help needed climbing 3-5 steps with a railing? : Total 6 Click Score: 6    End of Session Equipment Utilized During Treatment: Oxygen Activity Tolerance: Patient limited by fatigue Patient left: with call bell/phone within reach;in chair;with chair alarm set;with nursing/sitter in room Nurse Communication: Mobility status;Need for lift equipment PT Visit Diagnosis: Other abnormalities of gait and mobility (R26.89);Muscle weakness (generalized) (M62.81) Pain - Right/Left: Right Pain - part of body: Leg;Arm     Time: 7737-3668 PT Time Calculation (min) (ACUTE ONLY): 25 min  Charges:  $Therapeutic Exercise: 8-22 mins $Therapeutic Activity: 8-22 mins                     Devarious Pavek W,PT Acute Rehabilitation Services Pager:  [phone]  Office:  [place] 07/15/2019, 2:20 PM

## 2019-07-15 NOTE — Consult Note (Signed)
PV Navigator Consult I have reviewed patient's chart and have made several attempts to connect with patient's husband unsuccessfully.  Visited with patient at bedside today. She is awake and alert and can answer yes and no questions.Husband not present. I have left my contact information on bedside table for husband. Staff present at bedside due to patient pulled out her IV and mittens have been applied. Patient currently using her teeth to attempt to remove the mittens and staff member present, gently discouraging. She denies any pain at present.  Patient is status post R AKA on 07/04/19. Dressing dry and intact. Vascular continues to follow and will determine clinical course of PAD in LLE at time of follow up or if extremity becomes more symptomatic.  Current plan for transition appears to be discharge to SNF with palliative services when stable, due to ongoing failure to thrive, poor po intake. TOC team working with patient and husband and aware of their wishes not to return to [facility].  She denies any questions and no further needs assessed at this time.   Will follow acutely and remain available to this patient prn. Thank you, Cletis Media RN BSN [facility] [place] [phone]

## 2019-07-15 NOTE — Telephone Encounter (Signed)
Attempted to connect with patient's husband via home telephone number (no answer ) and mobile number (mailbox full). Unable to leave a message.   Cletis Media RN BSN [facility] [place]

## 2019-07-15 NOTE — Significant Event (Signed)
Rapid Response Event Note  Overview: Time Called: 1857 Arrival Time: 1859 Event Type: Respiratory Acute oxygen desaturation. Per primary RN, pt has been confused. Pt removed her nasal cannula and shortly after had an acute oxygen desaturation.   Initial Focused Assessment: Pt lying in bed, awake. Pt tracks RN in the room. Pt does not answer my orientation questions, but she squeezed my hands. Skin is warm, dry. Lung sounds are diminished. Pt has mild accessory muscle use. HR 87, RR 20, SpO2 96% on 2LNC.   VS: T 98.74F, BP 147/74  Interventions: -CXR ordered by MD -PRN breathing treatment given by RT -BiPAP order still active, recommended BiPAP HS per orders  Plan of Care (if not transferred): -BiPAP HS -Continuous pulse oximetry monitoring  Call rapid response for additional needs.  Event Summary: Name of Physician Notified: Dr. Broadus John at  (Notified by primary RN) Outcome: Stayed in room and stabalized Event End Time: [place]

## 2019-07-15 NOTE — Progress Notes (Signed)
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 [facility] course complicated by encephalopathy, has been treated for hepatic encephalopathy with lactulose -Also noted to have dysphagia requiring NG tube, tube feeds -Prognosis felt to be very poor, palliative medicine team was consulted by critical care MD -Also treated for postop blood loss anemia and hemorrhagic shock -She was transferred from [facility] to [facility] service  6/25 -In addition recently admitted to [facility] from 5/9 through 5/24 group C strep bacteremia, she had a TEE at the time which ruled out prosthetic valve endocarditis -Complicated by ongoing failure to thrive, poor p.o. 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 intake remains poor  Hemorrhagic shock Postop bleeding -Appears to have resolved -Transfused 7 units of blood this admission -Hemoglobin stable, between 7.5 and 8, anemia panel with iron deficiency,  -Given IV iron 6/27 -Hemoglobin is relatively stable  Acute on chronic respiratory failure COPD  -Prolonged mechanical ventilation from 6/17 through 6/22 -Maintain euvolemia, hold Lasix today, sodium slightly higher -Continue BiPAP nightly and daytime naps  AKI on CKD 3B -Creatinine relatively stable now in the 2.5 range, monitor  Hepatic encephalopathy Hepatitis C Liver cirrhosis -Continue lactulose, decreased dose given worsening diarrhea -Overall poor prognosis  Chronic diastolic CHF -Appears euvolemic, has been on low-dose p.o. Lasix, sodium trending up, hold Lasix today  Type 2 diabetes mellitus -CBGs are stable, continue sliding scale insulin  Ethics, chronic debility, multiple serious medical problems including vascular disease, CAD, COPD, OSA, CKD, liver cirrhosis, multiple frequent hospitalizations, high risk of ongoing decline -Palliative medicine consulted and following, now DNR  DVT prophylaxis: Lovenox Code Status: DNR Family Communication: No family at bedside, will attempt to contact spouse Disposition Plan:  Status is: Inpatient  Remains inpatient appropriate because:Inpatient level of care appropriate due to severity of illness, slightly more lethargic today with decreased p.o. intake   Dispo: The patient is from: SNF              Anticipated d/c is to: SNF              Anticipated d/c date is: ? 1-2days              Patient currently is not medically stable to d/c. Consultants:   PCCM transfer, vascular surgery, palliative medicine   Procedures: Right above-knee amputation 6/17  Antimicrobials:    Subjective: -Per staff was agitated and restless last night, did not  sleep much at all, drowsy this morning Objective: Vitals:    07/14/19 2008 07/14/19 2036 07/15/19 0339 07/15/19 0917  BP:  122/66 132/75 131/76  Pulse:  88 88 87  Resp:  19 19   Temp:  (!) 97.4 F (36.3 C) 97.6 F (36.4 C)   TempSrc:  Oral Oral   SpO2: (!) 86% 100%  100%  Weight:   96.7 kg   Height:        Intake/Output Summary (Last 24 hours) at 07/15/2019 0947 Last data filed at 07/15/2019 0059 Gross per 24 hour  Intake 720 ml  Output 400 ml  Net 320 ml   Filed Weights   07/13/19 0432 07/14/19 0308 07/15/19 0339  Weight: 99.2 kg 96.7 kg 96.7 kg    Examination:  General exam: Morbidly obese chronically ill female appears much older than stated age, somnolent arousable, oriented to self only CVS: S1-S2, regular rate rhythm Lungs: Clear anteriorly decreased at the bases Abdomen,: Soft, nontender, obese, bowel sounds present Extremities: Above-knee amputation on the right with sutures, ischemic changes in her left foot with amputation of left great toe Skin: As above, no rashes on exposed skin Psych: Poor insight and judgment  Data Reviewed:   CBC: Recent Labs  Lab 07/10/19 0257 07/10/19 2253 07/11/19 0242 07/12/19 0557 07/13/19 0559 07/14/19 0312 07/15/19 0406  WBC 11.7*   < > 10.3 10.3 8.8 8.7 10.8*  NEUTROABS 8.7*  --   --   --   --   --   --   HGB 5.2*   < > 8.0* 8.6* 7.6* 7.9* 7.4*  HCT 18.4*   < > 26.5* 28.6* 26.1* 27.3* 26.1*  MCV 88.9   < > 89.5 91.1 93.9 96.1 97.4  PLT 105*   < > 101* 118* 128* 141* 132*   < > = values in this interval not displayed.   Basic Metabolic Panel: Recent Labs  Lab 07/09/19 0947 07/09/19 0947 07/10/19 0257 07/10/19 0257 07/11/19 0242 07/12/19 0801 07/13/19 0559 07/14/19 0312 07/15/19 0406  NA 149*   < > 149*   < > 148* 142 144 145 146*  K 3.7   < > 3.8   < > 4.4 3.9 4.0 4.4 4.6  CL 119*   < > 117*   < > 116* 109 110 110 115*  CO2 22   < > 21*   < > 22 22 23 24 24   GLUCOSE 204*   < > 135*   < > 173* 118* 117* 106* 164*  BUN 45*   < > 46*   < > 47* 45* 45* 45* 48*    CREATININE 2.62*   < > 2.62*   < > 2.52* 2.52* 2.49* 2.35* 2.45*  CALCIUM 7.9*   < > 7.8*   < > 8.0* 8.0* 8.5* 8.6* 8.7*  MG 1.7  --   --   --   --   --   --   --   --   PHOS 1.9*  --  2.4*  --   --   --   --   --   --    < > = values in this interval not displayed.   GFR: Estimated Creatinine Clearance: 26.7 mL/min (A) (by C-G formula based on SCr of 2.45 mg/dL (H)). Liver Function Tests: Recent Labs  Lab 07/08/19 1108 07/09/19 0947 07/13/19 0559  AST 14* 11* 17  ALT 9 9 13   ALKPHOS 110 90 134*  BILITOT 0.9  1.0 1.2  PROT 7.7 7.0 8.2*  ALBUMIN 1.6* 1.5* 2.1*   No results for input(s): LIPASE, AMYLASE in the last 168 hours. No results for input(s): AMMONIA in the last 168 hours. Coagulation Profile: Recent Labs  Lab 07/10/19 0257 07/10/19 2253  INR 1.4* 1.2   Cardiac Enzymes: No results for input(s): CKTOTAL, CKMB, CKMBINDEX, TROPONINI in the last 168 hours. BNP (last 3 results) No results for input(s): PROBNP in the last 8760 hours. HbA1C: No results for input(s): HGBA1C in the last 72 hours. CBG: Recent Labs  Lab 07/14/19 0726 07/14/19 1150 07/14/19 1640 07/14/19 2017 07/15/19 0634  GLUCAP 95 129* 146* 177* 135*   Lipid Profile: No results for input(s): CHOL, HDL, LDLCALC, TRIG, CHOLHDL, LDLDIRECT in the last 72 hours. Thyroid Function Tests: No results for input(s): TSH, T4TOTAL, FREET4, T3FREE, THYROIDAB in the last 72 hours. Anemia Panel: Recent Labs    07/13/19 0559 07/13/19 1306  VITAMINB12 1,457*  --   FOLATE  --  46.4  FERRITIN 145  --   TIBC 267  --   IRON 31  --   RETICCTPCT 4.0*  --    Urine analysis:    Component Value Date/Time   COLORURINE YELLOW 06/27/2019 [place] 06/27/2019 1242   APPEARANCEUR Clear 08/03/2013 2138   LABSPEC 1.011 06/27/2019 1242   LABSPEC 1.028 08/03/2013 2138   PHURINE 5.0 06/27/2019 1242   GLUCOSEU NEGATIVE 06/27/2019 1242   GLUCOSEU Negative 08/03/2013 2138   HGBUR NEGATIVE 06/27/2019  1242   BILIRUBINUR NEGATIVE 06/27/2019 1242   BILIRUBINUR Negative 08/03/2013 2138   KETONESUR NEGATIVE 06/27/2019 1242   PROTEINUR NEGATIVE 06/27/2019 1242   UROBILINOGEN 0.2 09/27/2006 2132   NITRITE NEGATIVE 06/27/2019 1242   LEUKOCYTESUR NEGATIVE 06/27/2019 1242   LEUKOCYTESUR Negative 08/03/2013 2138   Sepsis Labs: @LABRCNTIP (procalcitonin:4,lacticidven:4)  ) No results found for this or any previous visit (from the past 240 hour(s)).   Scheduled Meds: . sodium chloride   Intravenous Once  . atorvastatin  40 mg Oral Daily  . enoxaparin (LOVENOX) injection  30 mg Subcutaneous Daily  . feeding supplement (PRO-STAT SUGAR FREE 64)  30 mL Oral TID  . folic acid  1 mg Oral Daily  . furosemide  20 mg Oral Daily  . hydrocortisone cream   Topical BID  . insulin aspart  0-15 Units Subcutaneous TID WC  . lactulose  10 g Oral Daily  . mouth rinse  15 mL Mouth Rinse BID  . multivitamin with minerals  1 tablet Oral Daily  . pantoprazole sodium  40 mg Oral Daily   Continuous Infusions: . sodium chloride Stopped (07/11/19 3329)     LOS: 18 days    Time spent: 41mn  PDomenic Polite MD [facility]  07/15/2019, 9:47 AM

## 2019-07-15 NOTE — Progress Notes (Signed)
Patient refused CPAP. She will call if she changes her mind.

## 2019-07-15 NOTE — Progress Notes (Signed)
RT called to patient room due to patient being lethargic, confused, belly breathing, and tachycardic.  Upon arrival patient responded to her name when called and stated that she wasn't having any difficulty breathing.  Patient noted to be 96% on 2L [place] with HR of 88.  Patient auscultated and noted diminished breath sounds therefore overrode a one time Albuterol for patient, given without any complications.  Rapid Response RN called to come to patient room.  Patient noted to also have bipap ordered for QHS but notes stating that patient has been refusing to wear.  Currently in no distress.  Rapid Response currently assessing patient.  Will continue to monitor.

## 2019-07-16 LAB — CBC
HCT: 25.2 % — ABNORMAL LOW (ref 36.0–46.0)
Hemoglobin: 7.1 g/dL — ABNORMAL LOW (ref 12.0–15.0)
MCH: 27.1 pg (ref 26.0–34.0)
MCHC: 28.2 g/dL — ABNORMAL LOW (ref 30.0–36.0)
MCV: 96.2 fL (ref 80.0–100.0)
Platelets: 144 10*3/uL — ABNORMAL LOW (ref 150–400)
RBC: 2.62 MIL/uL — ABNORMAL LOW (ref 3.87–5.11)
RDW: 23.7 % — ABNORMAL HIGH (ref 11.5–15.5)
WBC: 8.9 10*3/uL (ref 4.0–10.5)
nRBC: 0 % (ref 0.0–0.2)

## 2019-07-16 LAB — BASIC METABOLIC PANEL
Anion gap: 9 (ref 5–15)
BUN: 45 mg/dL — ABNORMAL HIGH (ref 8–23)
CO2: 24 mmol/L (ref 22–32)
Calcium: 8.6 mg/dL — ABNORMAL LOW (ref 8.9–10.3)
Chloride: 112 mmol/L — ABNORMAL HIGH (ref 98–111)
Creatinine, Ser: 2.35 mg/dL — ABNORMAL HIGH (ref 0.44–1.00)
GFR calc Af Amer: 25 mL/min — ABNORMAL LOW (ref >60)
GFR calc non Af Amer: 21 mL/min — ABNORMAL LOW (ref >60)
Glucose, Bld: 117 mg/dL — ABNORMAL HIGH (ref 70–99)
Potassium: 4.1 mmol/L (ref 3.5–5.1)
Sodium: 145 mmol/L (ref 135–145)

## 2019-07-16 LAB — GLUCOSE, CAPILLARY
Glucose-Capillary: 133 mg/dL — ABNORMAL HIGH (ref 70–99)
Glucose-Capillary: 139 mg/dL — ABNORMAL HIGH (ref 70–99)
Glucose-Capillary: 138 mg/dL — ABNORMAL HIGH (ref 70–99)
Glucose-Capillary: 125 mg/dL — ABNORMAL HIGH (ref 70–99)

## 2019-07-16 IMAGING — DX LEFT FOOT - COMPLETE 3+ VIEW
3 series · 3 of 3 positions shown · non-contrast
Comparison: None.

CLINICAL DATA: Diabetic foot ulcer.

EXAM:
LEFT FOOT - COMPLETE 3+ VIEW

[foot ap]
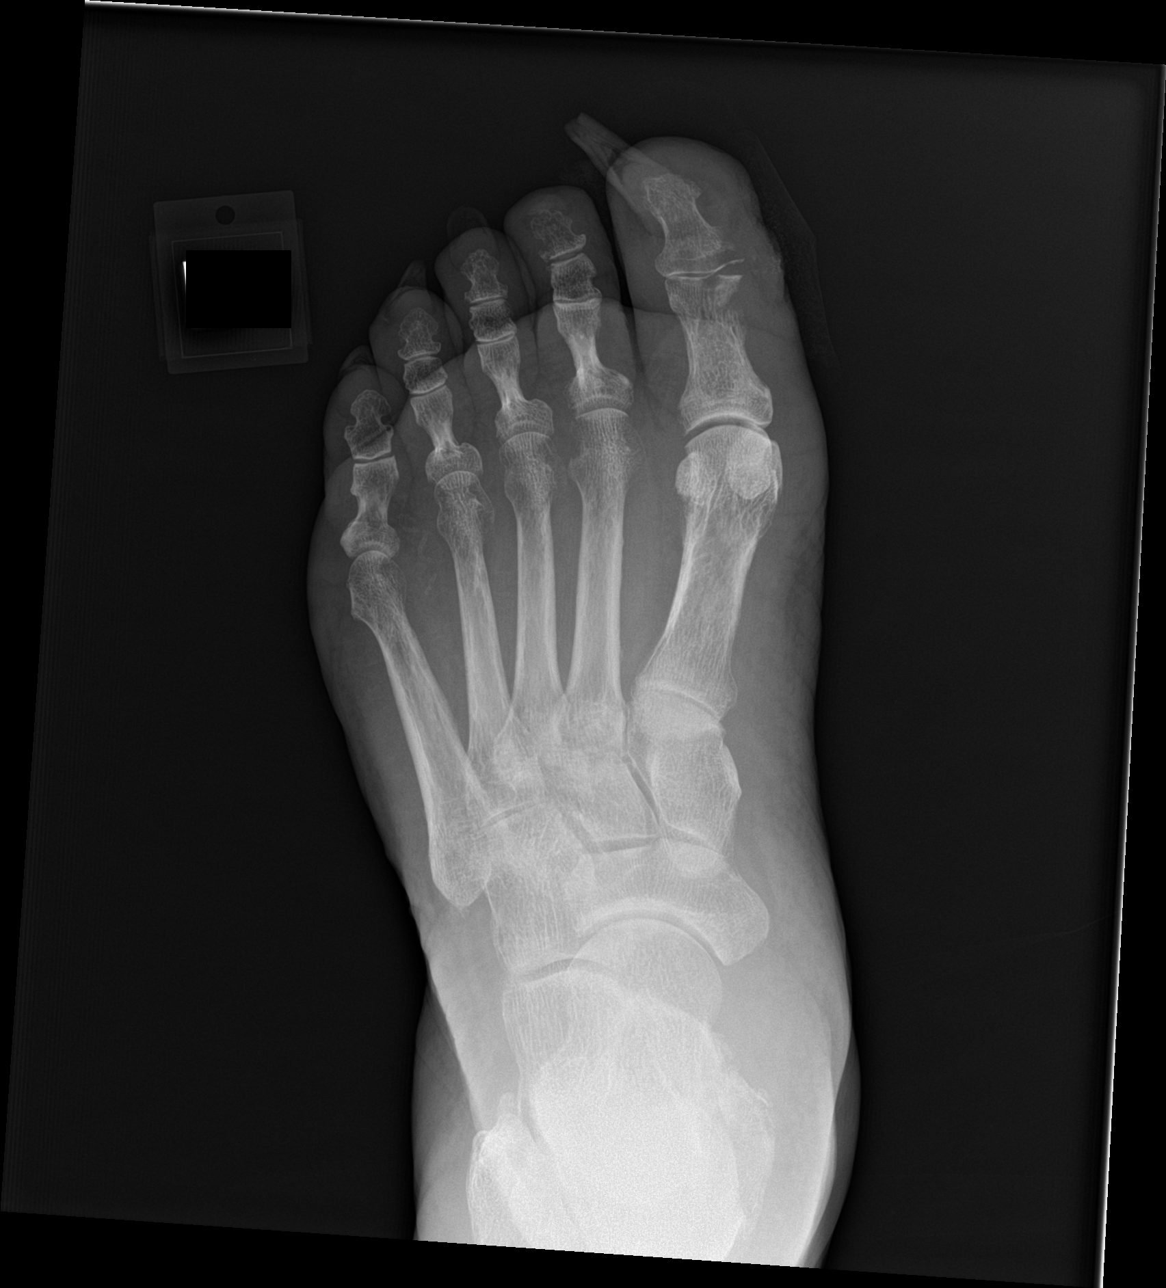

[foot obl]
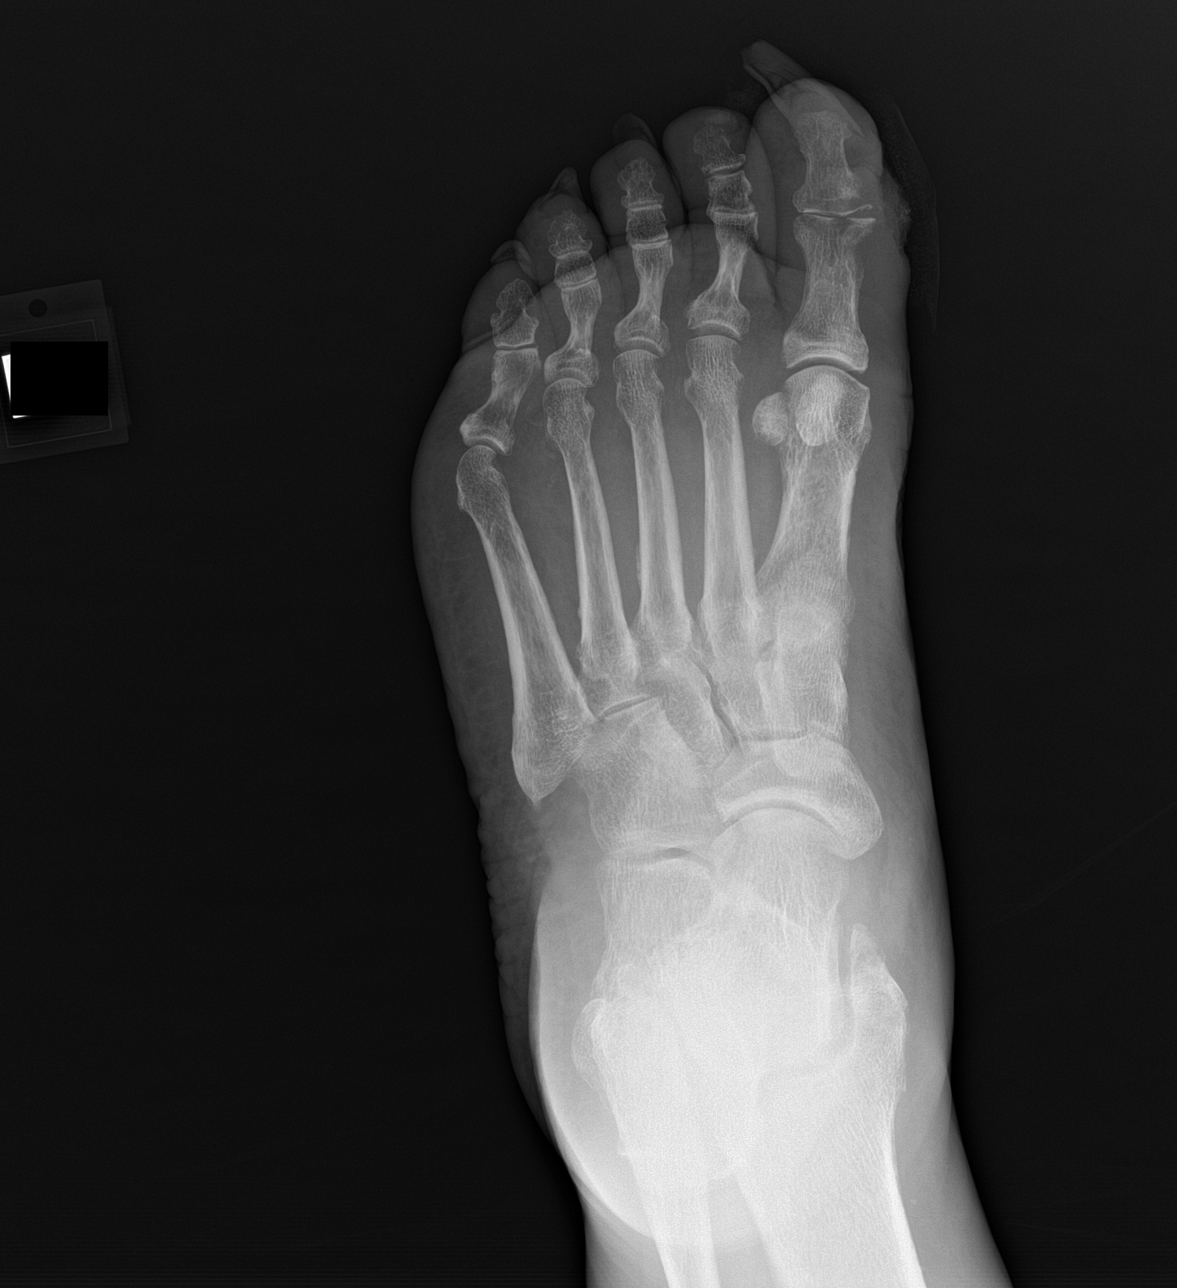

[foot lat]
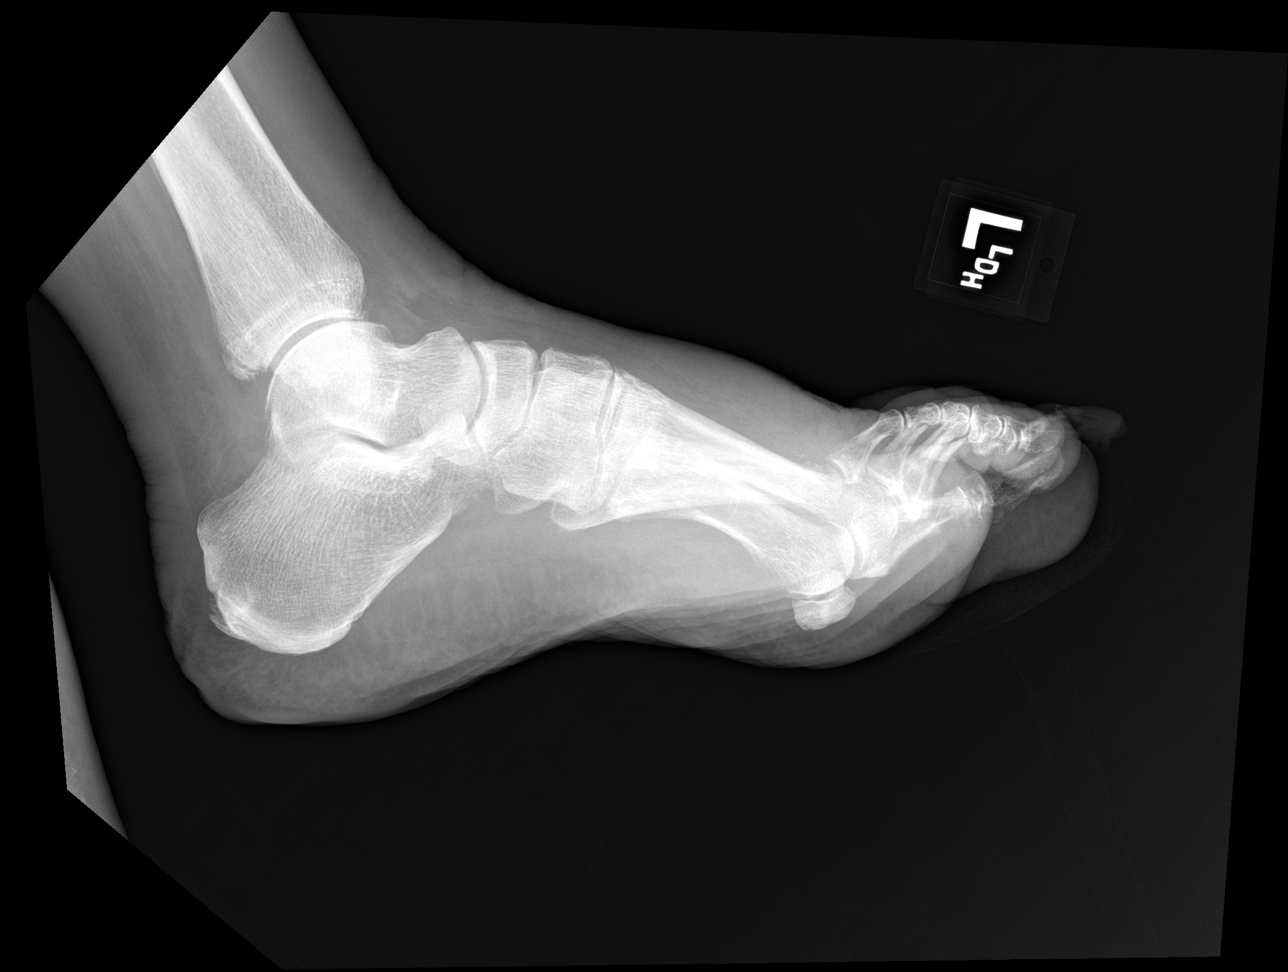

[3 of 3 positions shown; findings below may reference images not displayed]

FINDINGS: There is soft tissue irregularity and overlying bandage material
along the medial aspect of the great toe. There is also bone
destruction involving the base of the 1st distal phalanx medially
and medial aspects of the distal portion of the 1st proximal
phalanx. No soft tissue gas is seen. Diffuse distal and dorsal soft
tissue swelling is noted. Mild to moderate posterior calcaneal spur
formation.
IMPRESSION: Osteomyelitis involving the base of the 1st distal phalanx and
distal aspect of the 1st proximal phalanx.

## 2019-07-16 IMAGING — CT CT ABDOMEN WITHOUT CONTRAST
2 of 4 series · 15 of 46 positions shown, 17 images · non-contrast
Comparison: CT of the abdomen pelvis dated 03/12/2012

CLINICAL DATA: 62-year-old female with abdominal pain. Concern for
gastroenteritis or colitis.

EXAM:
CT ABDOMEN WITHOUT CONTRAST
TECHNIQUE: Multidetector CT imaging of the abdomen was performed following the
standard protocol without IV contrast.

[Series 2: routine abd/pel wo · axial · 0.73mm/px · z∈[-805,-590]mm · 12 of 49 slices shown, 14 images]
[im 3/49  soft-tissue]
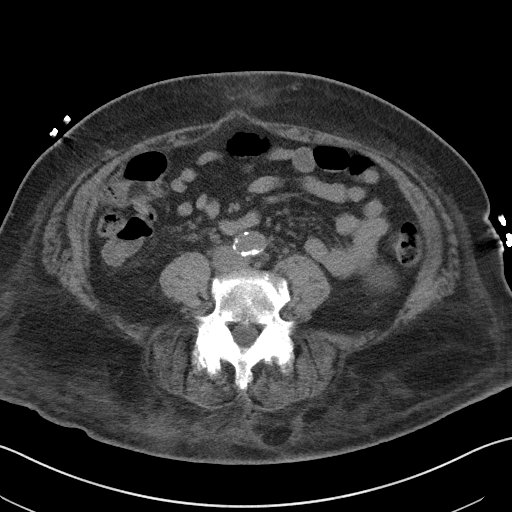
[im 3/49  bone]
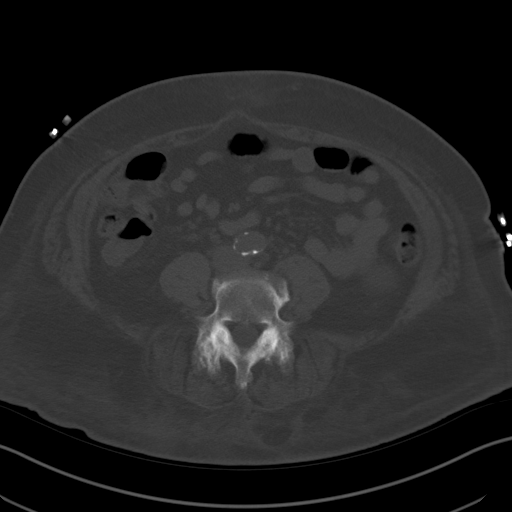
[im 7/49  soft-tissue]
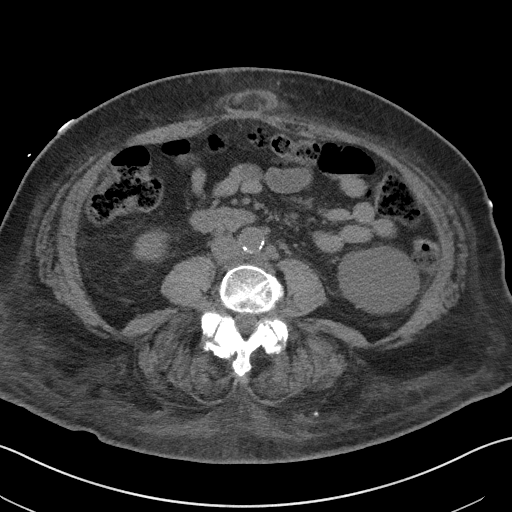
[im 12/49  soft-tissue]
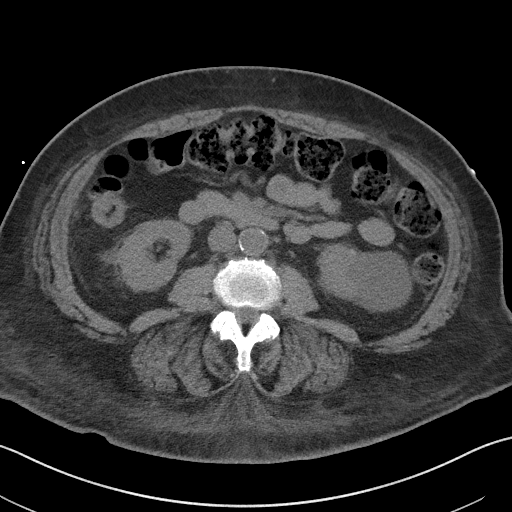
[im 14/49  soft-tissue]
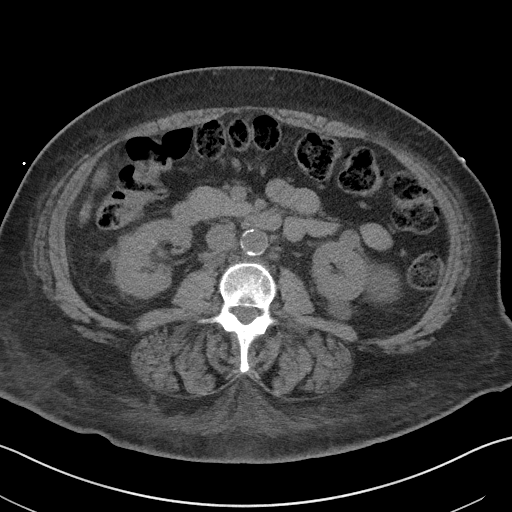
[im 19/49  soft-tissue]
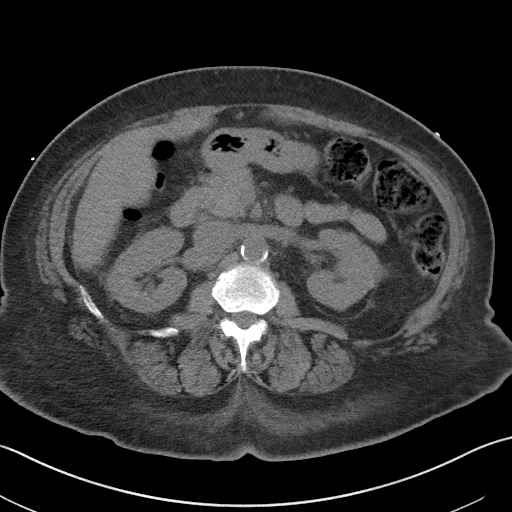
[im 23/49  soft-tissue]
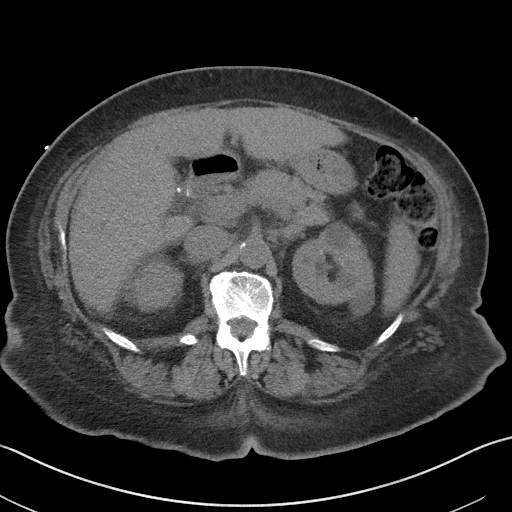
[im 26/49  soft-tissue]
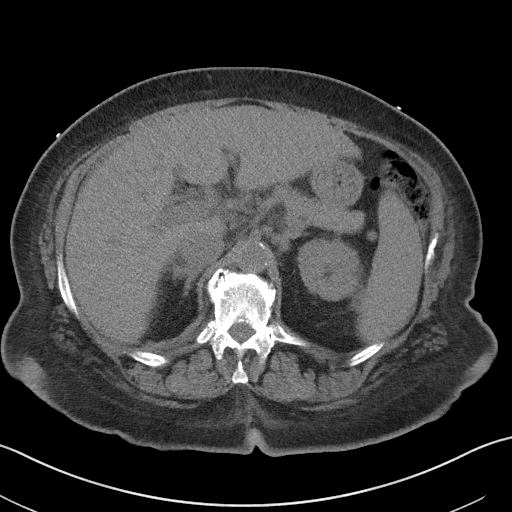
[im 30/49  soft-tissue]
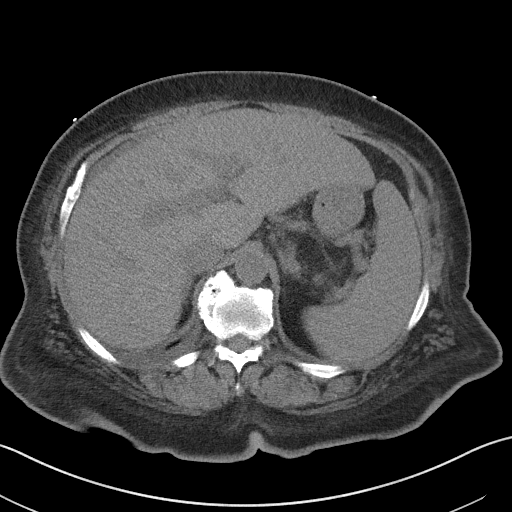
[im 35/49  soft-tissue]
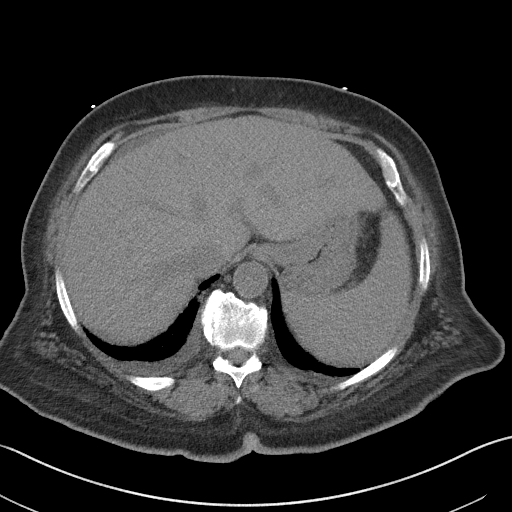
[im 35/49  bone]
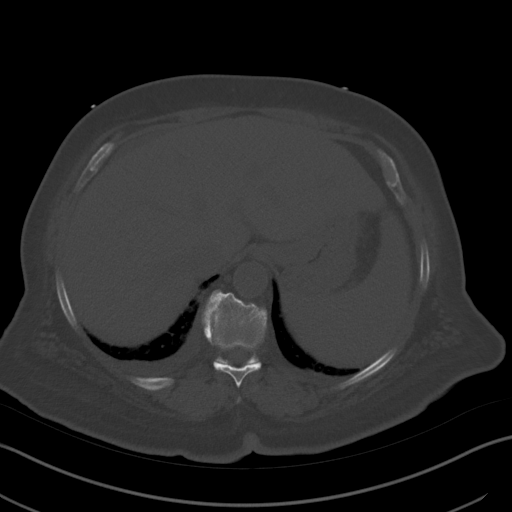
[im 37/49  soft-tissue]
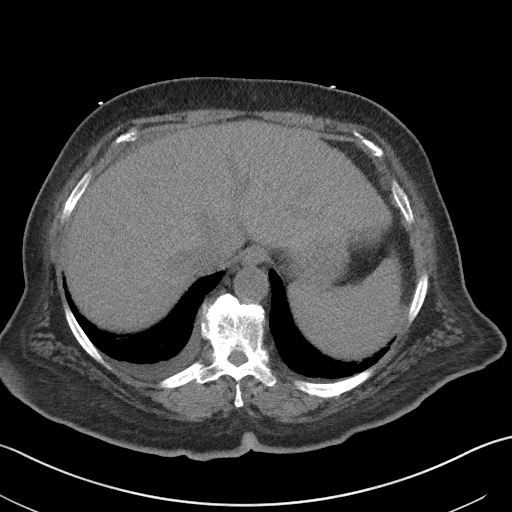
[im 42/49  soft-tissue]
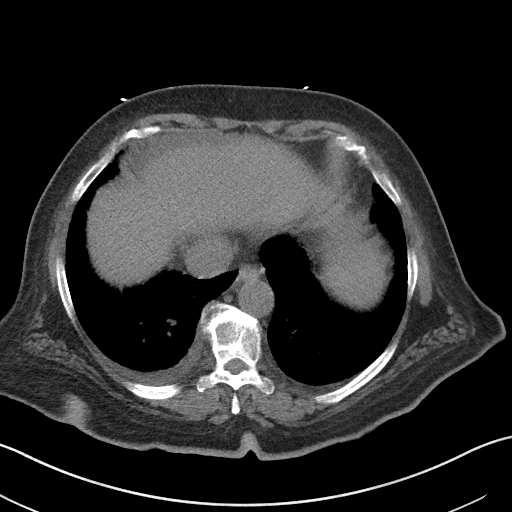
[im 46/49  soft-tissue]
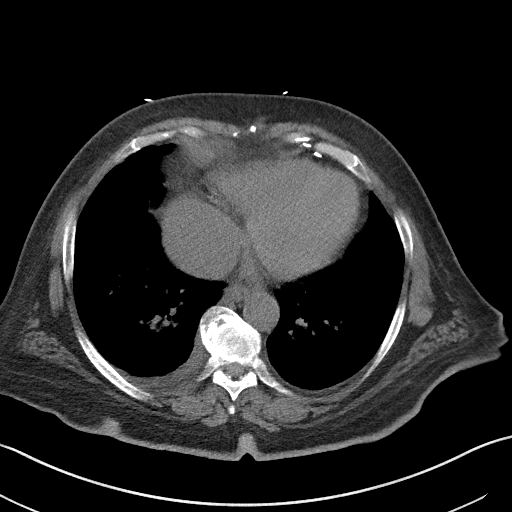

[Series 5: coronal st · coronal · 0.54mm/px · 3 of 93 slices shown]
[im 31/93  soft-tissue]
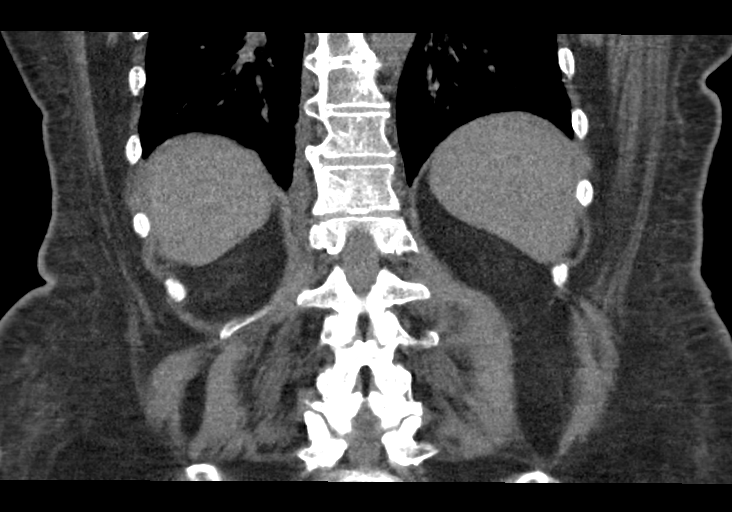
[im 41/93  soft-tissue]
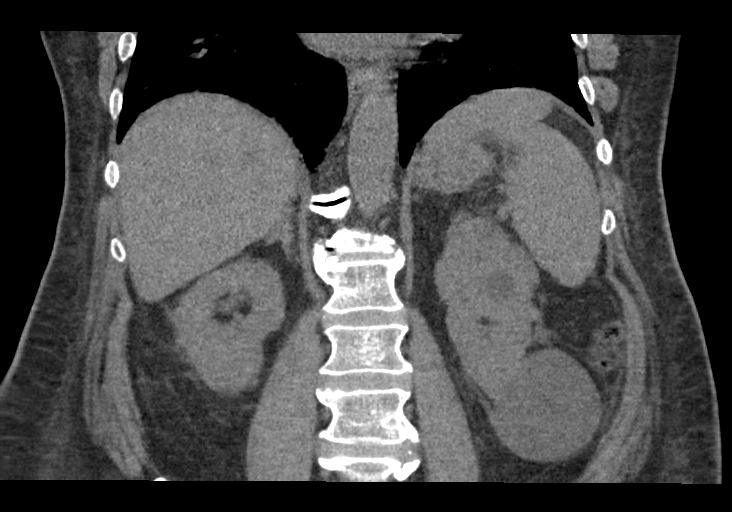
[im 52/93  soft-tissue]
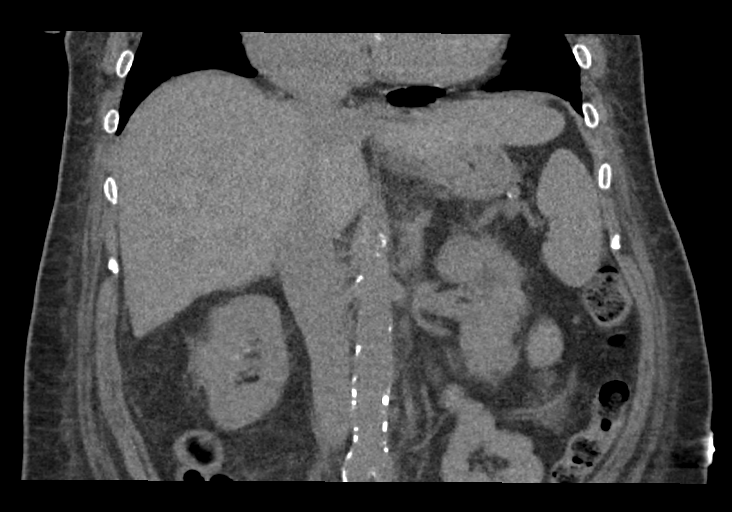

[15 of 46 positions shown; findings below may reference images not displayed]

FINDINGS: Evaluation of this exam is limited in the absence of intravenous
contrast.

Lower chest: There is cardiomegaly with diffuse interstitial and
interlobular septal prominence consistent with edema. Small
bilateral pleural effusions noted. There is calcification of the
mitral annulus. There is hypoattenuation of the cardiac blood pool
suggestive of a degree of anemia. Clinical correlation is
recommended.

No free air noted in the visualized abdomen. There is a small
perihepatic ascites.

Hepatobiliary: Slight irregularity of the liver contour suggestive
of early cirrhosis, likely related to passive congestion and heart
failure. Cholecystectomy.

Pancreas: Unremarkable. No pancreatic ductal dilatation or
surrounding inflammatory changes.

Spleen: Normal in size without focal abnormality.

Adrenals/Urinary Tract: The adrenal glands are unremarkable. There
is no hydronephrosis on either side. Renal vascular calcifications
noted. There is a 6 cm left renal inferior pole exophytic cysts.
Additional smaller left renal hypodense lesions are not
characterized but most likely represent cysts. The visualized
proximal ureters are unremarkable.

Stomach/Bowel: There is moderate stool in the visualized colon. No
dilated bowel noted in the abdomen. Please note evaluation of the
bowel is limited as the distal bowel in the lower abdomen and pelvis
are not included in the CT of the abdomen.

Vascular/Lymphatic: There is moderate atherosclerotic calcification
of the aorta. No portal venous gas. There is no adenopathy.

Other: Diffuse subcutaneous edema. Small fat containing
supraumbilical hernia. No fluid collection.

Musculoskeletal: Degenerative changes of the spine and osteopenia.
No acute osseous pathology.
IMPRESSION: 1. Cardiomegaly with findings of CHF and small bilateral pleural
effusions.
2. Morphologically changes of early cirrhosis, likely secondary to
passive congestion. Small perihepatic ascites.
3. The visualized bowel appear unremarkable.
4.  Aortic Atherosclerosis ([id number]).

## 2019-07-16 MED ORDER — SODIUM CHLORIDE 0.9 % IV SOLN
510.0000 mg | Freq: Once | INTRAVENOUS | Status: AC
  Administered 2019-07-16: 510 mg via INTRAVENOUS
  Filled 2019-07-16: qty 17

## 2019-07-16 MED ORDER — FUROSEMIDE 10 MG/ML IJ SOLN
40.0000 mg | Freq: Two times a day (BID) | INTRAMUSCULAR | Status: DC
  Administered 2019-07-16 – 2019-07-18 (×5): 40 mg via INTRAVENOUS
  Filled 2019-07-16 (×6): qty 4

## 2019-07-16 NOTE — Progress Notes (Signed)
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 [facility] course complicated by encephalopathy, has been treated for hepatic encephalopathy with lactulose -Also noted to have dysphagia requiring NG tube, tube feeds -Prognosis felt to be very poor, palliative medicine team was consulted by critical care MD -Also treated for postop blood loss anemia and hemorrhagic shock -She was transferred from [facility] to [facility] service  8/85 -Complicated by ongoing failure to thrive, poor p.o. intake, palliative care discussions, plan for possible SNF with palliative care when stable, high risk of complications and rehospitalizations. 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 intake remains poor  Hemorrhagic shock Postop bleeding -Appears to have resolved -Transfused 7 units of blood this admission -Hemoglobin stable, between 7.5 and 8, anemia panel with iron deficiency,  -Given IV iron 6/27 -Hemoglobin is 7.1 this morning, suspect a component of hemodilution as well, monitor with diuresis  Acute on chronic respiratory failure Acute on chronic diastolic CHF COPD  -mechanical ventilation from 6/17 through 6/22 -Continue BiPAP nightly and daytime naps -Diuretics were briefly held for few days given worsening sodium, overnight with some dyspnea and x-ray with fluid overload, resume IV Lasix  AKI on CKD 3B -Creatinine relatively stable now in the 2.5 range, monitor  Hepatic encephalopathy Hepatitis C Liver cirrhosis -Continue lactulose, dose increased again -Overall poor prognosis  Type 2 diabetes mellitus -CBGs are stable, continue sliding scale insulin  Ethics, chronic debility, multiple serious medical problems including vascular disease, CAD, COPD, OSA, CKD, liver cirrhosis, multiple frequent hospitalizations, high risk of ongoing decline -Palliative medicine consulted and following, now DNR, plan for palliative care follow-up at SNF  DVT prophylaxis: Lovenox Code Status: DNR Family Communication: No family at bedside, called and updated spouse 6/28, discussed poor prognosis Disposition Plan:  Status is: Inpatient  Remains inpatient appropriate because:Inpatient level of care appropriate due to severity of illness, fluid overload, fluctuating mental status, hepatic encephalopathy   Dispo: The patient is from: SNF              Anticipated d/c is to: SNF              Anticipated d/c date is: ? 2 days              Patient currently  is not medically stable to  d/c. Consultants:   PCCM transfer, vascular surgery, palliative medicine   Procedures: Right above-knee amputation 6/17  Antimicrobials:    Subjective: -Last night was a bit agitated as well, mittens on, mild dyspnea and hypoxic overnight x-ray with fluid overload, got a dose of IV Lasix overnight Objective: Vitals:   07/15/19 1900 07/15/19 1929 07/16/19 0415 07/16/19 0531  BP:  (!) 147/74 (!) 150/75   Pulse:  92 85   Resp: 20 20 18    Temp:  98.1 F (36.7 C) 98.2 F (36.8 C)   TempSrc:  Oral Oral   SpO2: 96% 100% 99%   Weight:    96.1 kg  Height:        Intake/Output Summary (Last 24 hours) at 07/16/2019 1050 Last data filed at 07/16/2019 0900 Gross per 24 hour  Intake 480 ml  Output 201 ml  Net 279 ml   Filed Weights   07/14/19 0308 07/15/19 0339 07/16/19 0531  Weight: 96.7 kg 96.7 kg 96.1 kg    Examination:  General exam: Morbidly obese chronically ill female, appears much older than stated age, more awake today, oriented to self and only partly to place, mild confusion, cognitive deficits noted CVS: S1-S2, regular rate rhythm Lungs: Few basilar rales, otherwise clear Abdomen: Soft, nontender, bowel sounds present Extremities: Right above-knee amputation with sutures, ischemic changes in the left foot with amputation of the left great toe  Skin: As above Psych: Poor insight and judgment  Data Reviewed:   CBC: Recent Labs  Lab 07/10/19 0257 07/10/19 2253 07/12/19 0557 07/13/19 0559 07/14/19 0312 07/15/19 0406 07/16/19 0340  WBC 11.7*   < > 10.3 8.8 8.7 10.8* 8.9  NEUTROABS 8.7*  --   --   --   --   --   --   HGB 5.2*   < > 8.6* 7.6* 7.9* 7.4* 7.1*  HCT 18.4*   < > 28.6* 26.1* 27.3* 26.1* 25.2*  MCV 88.9   < > 91.1 93.9 96.1 97.4 96.2  PLT 105*   < > 118* 128* 141* 132* 144*   < > = values in this interval not displayed.   Basic Metabolic Panel: Recent Labs  Lab 07/10/19 0257 07/11/19 0242 07/12/19 0801 07/13/19 0559 07/14/19 0312  07/15/19 0406 07/16/19 0340  NA 149*   < > 142 144 145 146* 145  K 3.8   < > 3.9 4.0 4.4 4.6 4.1  CL 117*   < > 109 110 110 115* 112*  CO2 21*   < > 22 23 24 24 24   GLUCOSE 135*   < > 118* 117* 106* 164* 117*  BUN 46*   < > 45* 45* 45* 48* 45*  CREATININE 2.62*   < > 2.52* 2.49* 2.35* 2.45* 2.35*  CALCIUM 7.8*   < > 8.0* 8.5* 8.6* 8.7* 8.6*  PHOS 2.4*  --   --   --   --   --   --    < > = values in this interval not displayed.   GFR: Estimated Creatinine Clearance: 27.7 mL/min (A) (by C-G formula based on SCr of 2.35 mg/dL (H)). Liver Function Tests: Recent Labs  Lab 07/13/19 0559  AST 17  ALT 13  ALKPHOS 134*  BILITOT 1.2  PROT 8.2*  ALBUMIN 2.1*   No results for input(s): LIPASE, AMYLASE in the last 168 hours. Recent Labs  Lab 07/15/19 1022  AMMONIA 57*   Coagulation Profile: Recent Labs  Lab  07/10/19 0257 07/10/19 2253  INR 1.4* 1.2   Cardiac Enzymes: No results for input(s): CKTOTAL, CKMB, CKMBINDEX, TROPONINI in the last 168 hours. BNP (last 3 results) No results for input(s): PROBNP in the last 8760 hours. HbA1C: No results for input(s): HGBA1C in the last 72 hours. CBG: Recent Labs  Lab 07/15/19 0634 07/15/19 1121 07/15/19 1708 07/15/19 2127 07/16/19 0615  GLUCAP 135* 121* 125* 130* 133*   Lipid Profile: No results for input(s): CHOL, HDL, LDLCALC, TRIG, CHOLHDL, LDLDIRECT in the last 72 hours. Thyroid Function Tests: No results for input(s): TSH, T4TOTAL, FREET4, T3FREE, THYROIDAB in the last 72 hours. Anemia Panel: Recent Labs    07/13/19 1306  FOLATE 46.4   Urine analysis:    Component Value Date/Time   COLORURINE YELLOW 06/27/2019 [place] 06/27/2019 1242   APPEARANCEUR Clear 08/03/2013 2138   LABSPEC 1.011 06/27/2019 1242   LABSPEC 1.028 08/03/2013 2138   PHURINE 5.0 06/27/2019 1242   GLUCOSEU NEGATIVE 06/27/2019 1242   GLUCOSEU Negative 08/03/2013 2138   HGBUR NEGATIVE 06/27/2019 1242   BILIRUBINUR NEGATIVE  06/27/2019 1242   BILIRUBINUR Negative 08/03/2013 2138   KETONESUR NEGATIVE 06/27/2019 1242   PROTEINUR NEGATIVE 06/27/2019 1242   UROBILINOGEN 0.2 09/27/2006 2132   NITRITE NEGATIVE 06/27/2019 1242   LEUKOCYTESUR NEGATIVE 06/27/2019 1242   LEUKOCYTESUR Negative 08/03/2013 2138   Sepsis Labs: @LABRCNTIP (procalcitonin:4,lacticidven:4)  ) No results found for this or any previous visit (from the past 240 hour(s)).   Scheduled Meds: . sodium chloride   Intravenous Once  . atorvastatin  40 mg Oral Daily  . enoxaparin (LOVENOX) injection  30 mg Subcutaneous Daily  . feeding supplement (PRO-STAT SUGAR FREE 64)  30 mL Oral TID  . folic acid  1 mg Oral Daily  . furosemide  40 mg Intravenous BID  . hydrocortisone cream   Topical BID  . insulin aspart  0-15 Units Subcutaneous TID WC  . lactulose  20 g Oral BID  . mouth rinse  15 mL Mouth Rinse BID  . multivitamin with minerals  1 tablet Oral Daily  . pantoprazole sodium  40 mg Oral Daily   Continuous Infusions: . sodium chloride Stopped (07/11/19 0608)     LOS: 19 days    Time spent: 28mn  PDomenic Polite MD [facility]  07/16/2019, 10:50 AM

## 2019-07-16 NOTE — Progress Notes (Signed)
Occupational Therapy Treatment Patient Details Name: [name] MRN: [id number] DOB: June 25, 1955 Today's Date: 07/16/2019    History of present illness [name] is a 64yo f with a PMHx of diastolic CHF with anasarca, NSTEMI, COPD, DMII uncontrolled, anemia, CKD IIIB, hepatitis C with cirrhosis who presented for altered mental status. 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 She was unable to be extubated following surgery. CCM was consulted for ventilator management.  Extubated 6/22.   OT comments  Pt received semi-reclined in bed, nurse and two CNAs present finishing brief/bed linen change. Pt c/o of pain today but could not rate on numerical pain scale or describe where/type of pain. Pt appeared fatigued and distracted throughout OT tx. Pt able to verbalize name and D.O.B. but unable to state place/month/year/situation. Setup pt for bed level grooming, requiring max assist with encouragement for washing face and mod assist for oral care. Pt initially swabbed mouth, however pt began to bite swab and required v/c's/redirection to use swab correctly. Pt unable to follow functional mobility basic commands, therefore did not transition to EOB today. Will plan to practice self-care activities sitting EOB as able next time. Recommending SNF. Will continue to follow pt acutely as able.    Follow Up Recommendations  SNF    Equipment Recommendations  Other (comment) (defer to next venue of care)    Recommendations for Other Services      Precautions / Restrictions Precautions Precautions: Fall Restrictions Weight Bearing Restrictions: No       Mobility Bed Mobility               General bed mobility comments: bed level tx secondary to fatigue and agitation         ADL either performed or assessed with clinical judgement   ADL Overall ADL's : Needs assistance/impaired     Grooming: Wash/dry  face;Maximal assistance;Bed level;Oral care;Moderate assistance Grooming Details (indicate cue type and reason): pt required max assist/encouragement to wash face semi-reclined at bed level; pt appeared to understand what was being asked of her but did not follow through; pt required mod assist for swabing mouth and v/c's for not biting down on swab             General ADL Comments: did not transfer to EOB secondary to nursing request; pt agitated and fatigued     Cognition Arousal/Alertness: Lethargic Behavior During Therapy: Flat affect Overall Cognitive Status: No family/caregiver present to determine baseline cognitive functioning Area of Impairment: Orientation;Attention;Memory;Following commands;Safety/judgement;Awareness;Problem solving                 Orientation Level: Place;Time;Situation;Person (pt refusing to answer questions )     Following Commands: Follows one step commands inconsistently;Follows one step commands with increased time Safety/Judgement: Decreased awareness of safety Awareness: Intellectual Problem Solving: Slow processing General Comments: pt demonstrated flat affect today; answering very few of therapist's questions; verbalized having pain but unable to rate; able to recite name and birthday only              General Comments VSS, pt had flat affect today, seemed agitated/upset but could not expand on it    Pertinent Vitals/ Pain       Pain Assessment: Faces Faces Pain Scale: Hurts a little bit Pain Location: RLE residual limb Pain Descriptors / Indicators: Aching;Tightness;Sharp Pain Intervention(s): Limited activity within patient's tolerance         Frequency  Min 2X/week        Progress Toward Goals  OT Goals(current goals can now be found in the care plan section)  Progress towards OT goals: Progressing toward goals  Acute Rehab OT Goals Patient Stated Goal: "to get better" ADL Goals Pt Will Perform Eating: with  set-up;sitting;bed level Pt Will Perform Grooming: with mod assist;bed level Pt/caregiver will Perform Home Exercise Program: Increased strength;Both right and left upper extremity Additional ADL Goal #1: Pt will consistently follow 1-2 step commands in order to increase carry over of skills from session to session. Additional ADL Goal #2: Pt will tolerate EOB x6 mins with moderate assist for stability as precursor for ADL.  Plan Discharge plan remains appropriate       AM-PAC OT "6 Clicks" Daily Activity     Outcome Measure   Help from another person eating meals?: Total Help from another person taking care of personal grooming?: A Little Help from another person toileting, which includes using toliet, bedpan, or urinal?: Total Help from another person bathing (including washing, rinsing, drying)?: A Lot Help from another person to put on and taking off regular upper body clothing?: A Lot Help from another person to put on and taking off regular lower body clothing?: Total 6 Click Score: 10    End of Session    OT Visit Diagnosis: Unsteadiness on feet (R26.81);Muscle weakness (generalized) (M62.81)   Activity Tolerance Patient limited by fatigue;Treatment limited secondary to agitation   Patient Left in bed;with call bell/phone within reach;with bed alarm set   Nurse Communication Mobility status        Time: 1750-1809 OT Time Calculation (min): 19 min  Charges: OT General Charges $OT Visit: 1 Visit OT Treatments $Self Care/Home Management : 8-22 mins  Michel Bickers, OTR/L [facility] [phone]   Francesca Jewett 07/16/2019, 6:56 PM

## 2019-07-16 NOTE — Progress Notes (Signed)
[facility])  Referral received for palliative care at d/c on 6/25.  At that time, pt spouse refused referral stating he needed much more help thank palliative.    Noted plans to d/c to SNF.  If [facility] can be of assistance when [name] d/c's, please let us know.  Tanya Carbon RN, BSN, [facility] Liaison (in [place])

## 2019-07-16 NOTE — TOC Progression Note (Signed)
Transition of Care [facility]) - Progression Note    Patient Details  Name: [name] MRN: [id number] Date of Birth: July 19, 1955  Transition of Care [facility]) CM/SW [place], LCSW Phone Number: 07/16/2019, 10:58 AM  Clinical Narrative:    CSW continuing to follow. Patient does not have SNF bed offers at this time.    Expected Discharge Plan: Long Term Nursing Home Barriers to Discharge: Continued Medical Work up  Expected Discharge Plan and Services Expected Discharge Plan: [place]       Living arrangements for the past 2 months: [place] Expected Discharge Date:  (unknown)                                     Social Determinants of Health (SDOH) Interventions    Readmission Risk Interventions Readmission Risk Prevention Plan 05/28/2019 02/26/2019 12/17/2018  Transportation Screening Complete Complete Complete  PCP or Specialist Appt within 5-7 Days - - -  Home Care Screening - - -  Medication Review (RN CM) - - -  HRI or [place] Work Consult for [place] Planning/Counseling - - -  SW consult not completed comments - - -  Palliative Care Screening - - -  Medication Review Press photographer) Complete Complete Referral to Pharmacy  PCP or Specialist appointment within 3-5 days of discharge - (No Data) Not Complete  PCP/Specialist Appt Not Complete comments - - DC date unknown but pt is established with providers  [place] or [place] Complete Complete Not Complete  HRI or Home Care Consult Pt Refusal Comments - - SNF resident  SW Recovery Care/Counseling Consult Complete - Complete  Palliative Care Screening Not Applicable Not Applicable Not Complete  Comments - - pending need  [place] Complete Patient Refused Complete  Some recent data might be hidden

## 2019-07-16 NOTE — Progress Notes (Addendum)
Discussed with RT about BIPAP. Pt still refusing, however remaining stable and asymptomatic on 2L [place]. Will continuing monitoring.

## 2019-07-17 DIAGNOSIS — K7200 Acute and subacute hepatic failure without coma: Secondary | ICD-10-CM

## 2019-07-17 DIAGNOSIS — L02611 Cutaneous abscess of right foot: Secondary | ICD-10-CM

## 2019-07-17 DIAGNOSIS — K7469 Other cirrhosis of liver: Secondary | ICD-10-CM

## 2019-07-17 LAB — COMPREHENSIVE METABOLIC PANEL
ALT: 12 U/L (ref 0–44)
AST: 15 U/L (ref 15–41)
Albumin: 2.2 g/dL — ABNORMAL LOW (ref 3.5–5.0)
Alkaline Phosphatase: 120 U/L (ref 38–126)
Anion gap: 10 (ref 5–15)
BUN: 47 mg/dL — ABNORMAL HIGH (ref 8–23)
CO2: 23 mmol/L (ref 22–32)
Calcium: 8.8 mg/dL — ABNORMAL LOW (ref 8.9–10.3)
Chloride: 111 mmol/L (ref 98–111)
Creatinine, Ser: 2.32 mg/dL — ABNORMAL HIGH (ref 0.44–1.00)
GFR calc Af Amer: 25 mL/min — ABNORMAL LOW (ref >60)
GFR calc non Af Amer: 22 mL/min — ABNORMAL LOW (ref >60)
Glucose, Bld: 148 mg/dL — ABNORMAL HIGH (ref 70–99)
Potassium: 4.2 mmol/L (ref 3.5–5.1)
Sodium: 144 mmol/L (ref 135–145)
Total Bilirubin: 1.4 mg/dL — ABNORMAL HIGH (ref 0.3–1.2)
Total Protein: 8.6 g/dL — ABNORMAL HIGH (ref 6.5–8.1)

## 2019-07-17 LAB — GLUCOSE, CAPILLARY
Glucose-Capillary: 129 mg/dL — ABNORMAL HIGH (ref 70–99)
Glucose-Capillary: 154 mg/dL — ABNORMAL HIGH (ref 70–99)
Glucose-Capillary: 112 mg/dL — ABNORMAL HIGH (ref 70–99)
Glucose-Capillary: 138 mg/dL — ABNORMAL HIGH (ref 70–99)

## 2019-07-17 LAB — CBC
HCT: 26.7 % — ABNORMAL LOW (ref 36.0–46.0)
Hemoglobin: 7.6 g/dL — ABNORMAL LOW (ref 12.0–15.0)
MCH: 27.6 pg (ref 26.0–34.0)
MCHC: 28.5 g/dL — ABNORMAL LOW (ref 30.0–36.0)
MCV: 97.1 fL (ref 80.0–100.0)
Platelets: 160 10*3/uL (ref 150–400)
RBC: 2.75 MIL/uL — ABNORMAL LOW (ref 3.87–5.11)
RDW: 23.9 % — ABNORMAL HIGH (ref 11.5–15.5)
WBC: 9.4 10*3/uL (ref 4.0–10.5)
nRBC: 0.3 % — ABNORMAL HIGH (ref 0.0–0.2)

## 2019-07-17 MED ORDER — ZINC OXIDE 12.8 % EX OINT
TOPICAL_OINTMENT | CUTANEOUS | Status: DC | PRN
  Filled 2019-07-17: qty 56.7

## 2019-07-17 NOTE — TOC Progression Note (Signed)
Transition of Care [facility]) - Progression Note    Patient Details  Name: [name] MRN: [id number] Date of Birth: 04-09-1955  Transition of Care [facility]) CM/SW [place], LCSW Phone Number: 07/17/2019, 3:53 PM  Clinical Narrative:    CSW attempted to leave voicemail for patient's spouse to discuss bed offer of [facility] versus returning to [facility], however, cell voicemail was full and no voicemail on home number. Will continue trying to contact him.    Expected Discharge Plan: Long Term Nursing Home Barriers to Discharge: Continued Medical Work up  Expected Discharge Plan and Services Expected Discharge Plan: [place]       Living arrangements for the past 2 months: [place] Expected Discharge Date:  (unknown)                                     Social Determinants of Health (SDOH) Interventions    Readmission Risk Interventions Readmission Risk Prevention Plan 05/28/2019 02/26/2019 12/17/2018  Transportation Screening Complete Complete Complete  PCP or Specialist Appt within 5-7 Days - - -  Home Care Screening - - -  Medication Review (RN CM) - - -  HRI or [place] Work Consult for [place] Planning/Counseling - - -  SW consult not completed comments - - -  Palliative Care Screening - - -  Medication Review Press photographer) Complete Complete Referral to Pharmacy  PCP or Specialist appointment within 3-5 days of discharge - (No Data) Not Complete  PCP/Specialist Appt Not Complete comments - - DC date unknown but pt is established with providers  [place] or [place] Complete Complete Not Complete  HRI or Home Care Consult Pt Refusal Comments - - SNF resident  SW Recovery Care/Counseling Consult Complete - Complete  Palliative Care Screening Not Applicable Not Applicable Not Complete  Comments - - pending need  [place] Complete Patient Refused Complete   Some recent data might be hidden

## 2019-07-17 NOTE — Progress Notes (Signed)
Nutrition Follow-up  RD working remotely.  DOCUMENTATION CODES:   Obesity unspecified  INTERVENTION:   -Continue 30 ml Prostat TID, each supplement provides 100 kcals and 15 grams protein -Continue MVI with minerals daily -Continue Magic cup TID with meals, each supplement provides 290 kcal and 9 grams of protein -Hormel Shake TID with meals, each supplement provides 520 kcals and 22 grams protein -Feeding assistance with meals  NUTRITION DIAGNOSIS:   Increased nutrient needs related to wound healing as evidenced by estimated needs.  Ongoing  GOAL:   Patient will meet greater than or equal to 90% of their needs  Progressing   MONITOR:   PO intake, Supplement acceptance, Diet advancement, Labs, Weight trends, Skin, I & O's  REASON FOR ASSESSMENT:   Consult Enteral/tube feeding initiation and management  ASSESSMENT:   Patient with PMH significant for CHF, NSTEMI, COPD, DM, gastric ulcer, chronic hepatitis C, cirrhosis of liver, and CKD III. Presents this admission with acute exacerbation CHF and bilateral lower extremity wounds.  6/17 - s/p R AKA 6/18 - Cortrak placed, tip gastric 6/19 - pt vomited TF and bile, TF stopped 6/20 - TF restarted 6/22 - extubated 6/25- cortrak removed, TF d/c, s/p BSE- advanced to heart healthy/ carb modified diet  Reviewed I/O's: -1 L x 24 hours and +6.1 L since 07/03/19  UOP: 1.9 L x 24 hours  RD visited with pt on 07/16/19; she was sleeping soundly at time of visit. RD also attempted to contact pt via phone today. Per RN notes, pt with periods of confusion.   Pt with variable intake; noted meal completion 25-75%. Pt has accepted the past few doses of Prostat.   Per MD notes, pt with poor prognosis. Palliative care team continues to reach out to husband regarding goals of care. TOC team following for SNF placement once medically stable for discharge.  Medications reviewed and include folvite and lasix.   Labs reviewed: CBGS:  125-139 (inpatient orders for glycemic control are 0-15 units insulin aspart TID with meals).   Diet Order:   Diet Order            Diet heart healthy/carb modified Room service appropriate? Yes; Fluid consistency: Thin  Diet effective now                 EDUCATION NEEDS:   Not appropriate for education at this time  Skin:  Skin Assessment: Skin Integrity Issues: Skin Integrity Issues:: Other (Comment), Incisions, Stage II DTI: L heel Stage II: right thigh, left thigh Unstageable: L thigh, R foot Incisions: right knee Other: wound to sacrum  Last BM:  07/17/19  Height:   Ht Readings from Last 1 Encounters:  06/28/19 5' 5"  (1.651 m)    Weight:   Wt Readings from Last 1 Encounters:  07/17/19 93.6 kg    Ideal Body Weight:  52.3 kg (adjusted for AKA)  BMI:  Body mass index is 34.34 kg/m.  Estimated Nutritional Needs:   Kcal:  1900-2100  Protein:  100-115 grams  Fluid:  >/= 1.9 L/day    Loistine Chance, RD, LDN, CDCES Registered Dietitian II Certified Diabetes Care and Education Specialist Please refer to [facility] for RD and/or RD on-call/weekend/after hours pager

## 2019-07-17 NOTE — Plan of Care (Signed)
  Problem: Education: Goal: Knowledge of General Education information will improve Description: Including pain rating scale, medication(s)/side effects and non-pharmacologic comfort measures Outcome: Not Progressing   Problem: Clinical Measurements: Goal: Ability to maintain clinical measurements within normal limits will improve Outcome: Not Progressing

## 2019-07-17 NOTE — Progress Notes (Signed)
Physical Therapy Treatment Patient Details Name: [name] MRN: [id number] DOB: [birth year] Today's Date: 07/17/2019    History of Present Illness [name] is a 64yo f with a PMHx of diastolic CHF with anasarca, NSTEMI, COPD, DMII uncontrolled, anemia, CKD IIIB, hepatitis C with cirrhosis who presented for altered mental status. 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 She was unable to be extubated following surgery. CCM was consulted for ventilator management.  Extubated 6/22.    PT Comments    Patient progressing slowly due to fluctuating mentation/arousal and now with decubiti.  Patient also with continued need for intermittent NIMV.  Wonder if she may qualify for LTACH initially.  PT to continue to follow acutely.    Follow Up Recommendations  SNF;LTACH     Equipment Recommendations  Other (comment) (TBA)    Recommendations for Other Services       Precautions / Restrictions Precautions Precautions: Fall    Mobility  Bed Mobility Overal bed mobility: Needs Assistance Bed Mobility: Rolling Rolling: Mod assist;+2 for safety/equipment         General bed mobility comments: rolled to place lift pad under pt  Transfers Overall transfer level: Needs assistance               General transfer comment: bed to chair via lift due to NT reporting with AP transfer pt with some issue with sugical wound and due to reports pt needing bipap  Ambulation/Gait                 Stairs             Wheelchair Mobility    Modified Rankin (Stroke Patients Only)       Balance Overall balance assessment: Needs assistance   Sitting balance-Leahy Scale: Poor Sitting balance - Comments: pulling forward with armrests on chair to reposition hips and place pillow behind back       Standing balance comment: unable                            Cognition  Arousal/Alertness: Awake/alert Behavior During Therapy: Flat affect Overall Cognitive Status: No family/caregiver present to determine baseline cognitive functioning Area of Impairment: Following commands;Safety/judgement;Memory;Attention                   Current Attention Level: Focused Memory: Decreased short-term memory Following Commands: Follows one step commands inconsistently;Follows one step commands with increased time Safety/Judgement: Decreased awareness of safety   Problem Solving: Slow processing;Requires verbal cues;Requires tactile cues        Exercises Other Exercises Other Exercises: supine R hip flexion AAROM x 10 Other Exercises: Supine L heel slides x 10 AAROM    General Comments General comments (skin integrity, edema, etc.): Pillows placed in chair prior to OOB and replaced tape on dressing on leg due to soiled with feces      Pertinent Vitals/Pain Faces Pain Scale: Hurts a little bit Pain Location: RLE residual limb Pain Descriptors / Indicators: Guarding Pain Intervention(s): Monitored during session;Repositioned    Home Living                      Prior Function            PT Goals (current goals can now be found in the care plan section) Progress towards PT goals: Progressing toward goals  Frequency    Min 2X/week      PT Plan Current plan remains appropriate    Co-evaluation              AM-PAC PT "6 Clicks" Mobility   Outcome Measure  Help needed turning from your back to your side while in a flat bed without using bedrails?: A Lot Help needed moving from lying on your back to sitting on the side of a flat bed without using bedrails?: Total Help needed moving to and from a bed to a chair (including a wheelchair)?: Total Help needed standing up from a chair using your arms (e.g., wheelchair or bedside chair)?: Total Help needed to walk in hospital room?: Total Help needed climbing 3-5 steps with a railing?  : Total 6 Click Score: 7    End of Session Equipment Utilized During Treatment: Oxygen Activity Tolerance: Patient tolerated treatment well Patient left: in chair;with chair alarm set;with call bell/phone within reach Nurse Communication: Mobility status;Need for lift equipment PT Visit Diagnosis: Other abnormalities of gait and mobility (R26.89);Muscle weakness (generalized) (M62.81)     Time: 1120-1140 PT Time Calculation (min) (ACUTE ONLY): 20 min  Charges:  $Therapeutic Activity: 8-22 mins                     Magda Kiel, PT Acute Rehabilitation Services GEXBM:[phone] Office:[phone] 07/17/2019    Reginia Naas 07/17/2019, 5:37 PM

## 2019-07-17 NOTE — Consult Note (Signed)
WOC consulted; contacted bedside nurse after review of the chart. LE wounds are followed by VVS.  Bedside nurse discussed MASD on the buttocks, inner thighs, labia.  Triple paste added as moisture barrier.  Patient has Purewick in place. Bedside nurse reported liquid stool at which time I have suggested requesting FMS if appropriate.  Added PRevalon boot for the left foot to offload heel more effectively. Added mattress replacement for moisture management in patient with at risk Braden scores.    Re consult if needed, will not follow at this time. Thanks  Cylah Fannin [employer], RN,CWOCN, CNS, [place] [phone])

## 2019-07-17 NOTE — Progress Notes (Addendum)
Pt stool was bright red and brown. Asymptomatic. Vitals taken. MD notified.   BP (!) 149/73 (BP Location: Left Arm)   Pulse 87   Temp 98.5 F (36.9 C) (Oral)   Resp 18   Ht 5' 5"  (1.651 m)   Wt 93.6 kg   SpO2 99%   BMI 34.34 kg/m    STAT H&H and occult blood ordered. Unfortunately unable to use stool. Will continue to monitor.

## 2019-07-17 NOTE — Progress Notes (Signed)
Patient ID: [name], female   DOB: 05-06-1955, 64 y.o.   MRN: [id number]  PROGRESS NOTE    [name]  SLH:[id number] DOB: [birth year] DOA: 06/27/2019 PCP: [place]   Brief Narrative:  64 year old chronically ill female resident of [facility] SNF with multiple medical problems including CAD, COPD, CKD stage IIIb, chronic diastolic CHF, hep C, cirrhosis was admitted with sepsis, encephalopathy and extensive full-thickness tissue loss of right foot. MRI indicated right foot abscess, myositis and tenosynovitis as well as left foot myositis and cellulitis. 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  She was unable to be extubated postop and was admitted to the ICU on 07/04/19.  [facility] course complicated by encephalopathy, has been treated for hepatic encephalopathy with lactulose -Also noted to have dysphagia requiring NG tube, tube feeds -Prognosis felt to be very poor, palliative medicine team was consulted  -Also treated for postop blood loss anemia and hemorrhagic shock -She was transferred from [facility] to [facility] service  1/57/26 -Complicated by ongoing failure to thrive, poor p.o. intake, palliative care discussions, plan for possible SNF with palliative care when stable, high risk of complications and rehospitalizations. -Finally cortrak removed and started dysphagia diet - On 07/15/19, she became more lethargic, ammonia level went up to 57, lactulose dose increased again -Overnight of 07/16/2019, she had worsening dyspnea with chest x-ray notable for fluid overload.   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  Monitor mental status. -Fall precautions.  PT/OT recommends SNF placement -SLP following.  Tube feeds discontinued.  Started oral feeding but oral intake remains poor  Hemorrhagic shock Postop bleeding -Appears to have resolved.  Status post 7 units packed red cells transfusion during this hospitalization.  Hemoglobin 7.6 today.  Monitor H&H.  Transfuse if hemoglobin is less than 7.  Received IV iron on 07/14/2019  Acute on chronic hypoxic respiratory failure Acute on chronic diastolic heart failure COPD -Mechanical ventilation from 07/04/2019-07/09/2019 -Continue BiPAP nightly and daytime during naps -Diuretics were briefly held for few days giving worsening sodium went overnight on 07/16/2019, patient was more hypoxic with chest x-ray showing fluid overload and subsequently intravenous Lasix was restarted.  Strict input and output.  Daily weights.  Fluid restriction.  Acute kidney injury on chronic kidney disease stage IIIb -Creatinine relatively stable now in the 2.5 range.  Creatinine 2.3 today.  Monitor  Hepatic encephalopathy Hepatitis C Liver cirrhosis -Continue lactulose and Lasix.  Overall poor prognosis.  Diabetes mellitus type 2 -Continue CBGs with SSI.  Generalized deconditioning -Overall prognosis is very poor because of multiple serious medical problems including vascular disease/CAD//COPD/OSA/chronic renal disease stage IIIb/liver cirrhosis/multiple frequent hospitalizations -Palliative care has evaluated the patient and has had family discussion during the hospitalization.  Recommend outpatient palliative care follow-up. -If condition worsens, consider hospice/comfort measures -will need SNF placement   DVT prophylaxis: Lovenox Code Status:  DNR Family Communication: None at bedside Disposition Plan: Status is: Inpatient  Remains inpatient appropriate because:Inpatient level of care appropriate due to severity of illness  Dispo: The patient is from: SNF              Anticipated d/c is to: SNF              Anticipated d/c date is: 2 days              Patient currently is not medically stable to d/c.  Consultants: PCCM/vascular surgery/palliative  Procedures:  Right above-knee amputation on 07/04/2019  Antimicrobials:  Anti-infectives (From admission, onward)   Start     Dose/Rate Route Frequency Ordered Stop   07/02/19 1200  vancomycin (VANCOCIN) IVPB 1000 mg/200 mL premix        1,000 mg 200 mL/hr over 60 Minutes Intravenous Every 48 hours 07/01/19 1228 07/06/19 2359   06/28/19 1000  vancomycin (VANCOREADY) IVPB 1250 mg/250 mL  Status:  Discontinued        1,250 mg 166.7 mL/hr over 90 Minutes Intravenous Every 24 hours 06/27/19 1223 07/01/19 1213   06/27/19 1300  ceFEPIme (MAXIPIME) 2 g in sodium chloride 0.9 % 100 mL IVPB        2 g 200 mL/hr over 30 Minutes Intravenous Every 12 hours 06/27/19 1223 07/07/19 0021   06/27/19 1230  vancomycin (VANCOREADY) IVPB 2000 mg/400 mL        2,000 mg 200 mL/hr over 120 Minutes Intravenous  Once 06/27/19 1223 06/27/19 1743       Subjective: Patient seen and examined at bedside.  She is sleepy, wakes up slightly, answers only some questions; poor historian.  No overnight fever or vomiting reported by nursing staff.  Objective: Vitals:   07/16/19 2007 07/17/19 0053 07/17/19 0419 07/17/19 1140  BP: 118/60  (!) 147/72 (!) 146/72  Pulse: 94  91 87  Resp: 20  18 18   Temp: 98.2 F (36.8 C)  98.2 F (36.8 C) 98.4 F (36.9 C)  TempSrc: Oral  Oral Oral  SpO2: 99%  97% 99%  Weight:  93.6 kg    Height:        Intake/Output Summary (Last 24 hours) at 07/17/2019 1639 Last data filed at 07/17/2019 1300 Gross per 24 hour  Intake 720 ml  Output 1850 ml  Net -1130 ml    Filed Weights   07/15/19 0339 07/16/19 0531 07/17/19 0053  Weight: 96.7 kg 96.1 kg 93.6 kg    Examination:  General exam: Appears calm and comfortable.  Looks chronically ill. Respiratory system: Bilateral decreased breath sounds at bases with some scattered crackles Cardiovascular system: S1 & S2 heard, Rate controlled Gastrointestinal system: Abdomen is slightly distended, soft and nontender. Normal bowel sounds heard. Extremities: No cyanosis, clubbing; right AKA present.  Ischemic changes in left foot with amputation of left great toe Central nervous system: Slightly confused to time.  Poor historian no focal neurological deficits. Moving extremities Skin: As above Psychiatry: Could not be assessed because of mental status    Data Reviewed: I have personally reviewed following labs and imaging studies  CBC: Recent Labs  Lab 07/13/19 0559 07/14/19 0312 07/15/19 0406 07/16/19 0340 07/17/19 0501  WBC 8.8 8.7 10.8* 8.9 9.4  HGB 7.6* 7.9* 7.4* 7.1* 7.6*  HCT 26.1* 27.3* 26.1* 25.2* 26.7*  MCV 93.9 96.1 97.4 96.2 97.1  PLT 128* 141* 132* 144* 734   Basic Metabolic Panel: Recent Labs  Lab 07/13/19 0559 07/14/19 0312 07/15/19 0406 07/16/19 0340 07/17/19 0501  NA 144 145 146* 145 144  K 4.0 4.4 4.6 4.1 4.2  CL 110 110 115* 112* 111  CO2 23 24 24 24 23   GLUCOSE 117* 106* 164* 117* 148*  BUN 45* 45* 48* 45* 47*  CREATININE 2.49* 2.35* 2.45* 2.35* 2.32*  CALCIUM 8.5* 8.6* 8.7* 8.6* 8.8*   GFR: Estimated Creatinine Clearance: 27.7 mL/min (A) (by C-G formula based on SCr of 2.32 mg/dL (H)). Liver Function Tests: Recent Labs  Lab 07/13/19 0559 07/17/19 0501  AST 17 15  ALT 13 12  ALKPHOS 134* 120  BILITOT 1.2 1.4*  PROT 8.2* 8.6*  ALBUMIN 2.1* 2.2*   No results for input(s): LIPASE, AMYLASE in the last 168 hours. Recent Labs  Lab 07/15/19 1022  AMMONIA 57*   Coagulation Profile: Recent Labs  Lab 07/10/19 2253  INR 1.2   Cardiac Enzymes: No  results for input(s): CKTOTAL, CKMB, CKMBINDEX, TROPONINI in the last 168 hours. BNP (last 3 results) No results for input(s): PROBNP in the last 8760 hours. HbA1C: No results for input(s): HGBA1C in the last 72 hours. CBG: Recent Labs  Lab 07/16/19 1730 07/16/19 2121 07/17/19 0616 07/17/19 1057 07/17/19 1635  GLUCAP 138* 125* 129* 154* 112*   Lipid Profile: No results for input(s): CHOL, HDL, LDLCALC, TRIG, CHOLHDL, LDLDIRECT in the last 72 hours. Thyroid Function Tests: No results for input(s): TSH, T4TOTAL, FREET4, T3FREE, THYROIDAB in the last 72 hours. Anemia Panel: No results for input(s): VITAMINB12, FOLATE, FERRITIN, TIBC, IRON, RETICCTPCT in the last 72 hours. Sepsis Labs: No results for input(s): PROCALCITON, LATICACIDVEN in the last 168 hours.  No results found for this or any previous visit (from the past 240 hour(s)).       Radiology Studies: DG CHEST PORT 1 VIEW  Result Date: 07/15/2019 CLINICAL DATA:  Altered mental status.  Chest pain. EXAM: PORTABLE CHEST 1 VIEW COMPARISON:  07/07/2019 FINDINGS: Redemonstration of cardiomegaly. Previous CABG and aortic valve replacement. Pulmonary edema with bilateral effusions. Findings similar to the study of 8 days ago. No new abnormality seen. IMPRESSION: Persistent congestive heart failure pattern. Similar appearance with edema and effusions. Electronically Signed   By: Nelson Chimes M.D.   On: 07/15/2019 19:01        Scheduled Meds: . sodium chloride   Intravenous Once  . atorvastatin  40 mg Oral Daily  . enoxaparin (LOVENOX) injection  30 mg Subcutaneous Daily  . feeding supplement (PRO-STAT SUGAR FREE 64)  30 mL Oral TID  . folic acid  1 mg Oral Daily  . furosemide  40 mg Intravenous BID  . hydrocortisone cream   Topical BID  . insulin aspart  0-15 Units Subcutaneous TID WC  . lactulose  20 g Oral BID  . mouth rinse  15 mL Mouth Rinse BID  . multivitamin with minerals  1 tablet Oral Daily  . pantoprazole  sodium  40 mg Oral Daily   Continuous Infusions: . sodium chloride Stopped (07/11/19 2951)          Aline August, MD [facility] 07/17/2019, 4:39 PM

## 2019-07-18 ENCOUNTER — Inpatient Hospital Stay (HOSPITAL_COMMUNITY): Payer: Medicaid Other

## 2019-07-18 DIAGNOSIS — L02619 Cutaneous abscess of unspecified foot: Secondary | ICD-10-CM

## 2019-07-18 LAB — CBC WITH DIFFERENTIAL/PLATELET
Abs Immature Granulocytes: 0.02 10*3/uL (ref 0.00–0.07)
Basophils Absolute: 0.1 10*3/uL (ref 0.0–0.1)
Basophils Relative: 1 %
Eosinophils Absolute: 0.4 10*3/uL (ref 0.0–0.5)
Eosinophils Relative: 5 %
HCT: 26.1 % — ABNORMAL LOW (ref 36.0–46.0)
Hemoglobin: 7.5 g/dL — ABNORMAL LOW (ref 12.0–15.0)
Immature Granulocytes: 0 %
Lymphocytes Relative: 8 %
Lymphs Abs: 0.7 10*3/uL (ref 0.7–4.0)
MCH: 28.1 pg (ref 26.0–34.0)
MCHC: 28.7 g/dL — ABNORMAL LOW (ref 30.0–36.0)
MCV: 97.8 fL (ref 80.0–100.0)
Monocytes Absolute: 0.6 10*3/uL (ref 0.1–1.0)
Monocytes Relative: 7 %
Neutro Abs: 7.2 10*3/uL (ref 1.7–7.7)
Neutrophils Relative %: 79 %
Platelets: 158 10*3/uL (ref 150–400)
RBC: 2.67 MIL/uL — ABNORMAL LOW (ref 3.87–5.11)
RDW: 23.9 % — ABNORMAL HIGH (ref 11.5–15.5)
WBC: 9.1 10*3/uL (ref 4.0–10.5)
nRBC: 0.3 % — ABNORMAL HIGH (ref 0.0–0.2)

## 2019-07-18 LAB — GLUCOSE, CAPILLARY
Glucose-Capillary: 129 mg/dL — ABNORMAL HIGH (ref 70–99)
Glucose-Capillary: 118 mg/dL — ABNORMAL HIGH (ref 70–99)
Glucose-Capillary: 116 mg/dL — ABNORMAL HIGH (ref 70–99)
Glucose-Capillary: 131 mg/dL — ABNORMAL HIGH (ref 70–99)

## 2019-07-18 LAB — MAGNESIUM: Magnesium: 1.6 mg/dL — ABNORMAL LOW (ref 1.7–2.4)

## 2019-07-18 LAB — COMPREHENSIVE METABOLIC PANEL
ALT: 12 U/L (ref 0–44)
AST: 19 U/L (ref 15–41)
Albumin: 2.2 g/dL — ABNORMAL LOW (ref 3.5–5.0)
Alkaline Phosphatase: 111 U/L (ref 38–126)
Anion gap: 9 (ref 5–15)
BUN: 50 mg/dL — ABNORMAL HIGH (ref 8–23)
CO2: 26 mmol/L (ref 22–32)
Calcium: 8.7 mg/dL — ABNORMAL LOW (ref 8.9–10.3)
Chloride: 111 mmol/L (ref 98–111)
Creatinine, Ser: 2.33 mg/dL — ABNORMAL HIGH (ref 0.44–1.00)
GFR calc Af Amer: 25 mL/min — ABNORMAL LOW (ref >60)
GFR calc non Af Amer: 21 mL/min — ABNORMAL LOW (ref >60)
Glucose, Bld: 143 mg/dL — ABNORMAL HIGH (ref 70–99)
Potassium: 4.1 mmol/L (ref 3.5–5.1)
Sodium: 146 mmol/L — ABNORMAL HIGH (ref 135–145)
Total Bilirubin: 1.4 mg/dL — ABNORMAL HIGH (ref 0.3–1.2)
Total Protein: 8.5 g/dL — ABNORMAL HIGH (ref 6.5–8.1)

## 2019-07-18 LAB — AMMONIA: Ammonia: 52 umol/L — ABNORMAL HIGH (ref 9–35)

## 2019-07-18 LAB — OCCULT BLOOD X 1 CARD TO LAB, STOOL: Fecal Occult Bld: POSITIVE — AB

## 2019-07-18 MED ORDER — MAGNESIUM SULFATE 2 GM/50ML IV SOLN
2.0000 g | Freq: Once | INTRAVENOUS | Status: AC
  Administered 2019-07-18: 2 g via INTRAVENOUS
  Filled 2019-07-18: qty 50

## 2019-07-18 MED ORDER — FUROSEMIDE 10 MG/ML IJ SOLN
80.0000 mg | Freq: Two times a day (BID) | INTRAMUSCULAR | Status: DC
  Administered 2019-07-18 – 2019-07-19 (×3): 80 mg via INTRAVENOUS
  Filled 2019-07-18 (×3): qty 8

## 2019-07-18 NOTE — Progress Notes (Signed)
Patient ID: [name], female   DOB: 1955/07/31, 64 y.o.   MRN: [id number]  PROGRESS NOTE    [name]  CNO:[id number] DOB: 07-24-1955 DOA: 06/27/2019 PCP: [place]   Brief Narrative:  64 year old chronically ill female resident of [facility] SNF with multiple medical problems including CAD, COPD, CKD stage IIIb, chronic diastolic CHF, hep C, cirrhosis was admitted with sepsis, encephalopathy and extensive full-thickness tissue loss of right foot. MRI indicated right foot abscess, myositis and tenosynovitis as well as left foot myositis and cellulitis. 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  She was unable to be extubated postop and was admitted to the ICU on 07/04/19.  [facility] course complicated by encephalopathy, has been treated for hepatic encephalopathy with lactulose -Also noted to have dysphagia requiring NG tube, tube feeds -Prognosis felt to be very poor, palliative medicine team was consulted  -Also treated for postop blood loss anemia and hemorrhagic shock -She was transferred from [facility] to [facility] service  2/65/65 -Complicated by ongoing failure to thrive, poor p.o. intake, palliative care discussions, plan for possible SNF with palliative care when stable, high risk of complications and rehospitalizations. -Finally cortrak removed and started dysphagia diet - On 07/15/19, she became more lethargic, ammonia level went up to 57, lactulose dose increased again -Overnight of 07/16/2019, she had worsening dyspnea with chest x-ray notable for fluid overload.   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  Monitor mental status. -Fall precautions.  PT/OT recommends SNF placement -SLP following.  Tube feeds discontinued.  Started oral feeding but oral intake remains poor  Hemorrhagic shock Postop bleeding -Appears to have resolved.  Status post 7 units packed red cells transfusion during this hospitalization.  Hemoglobin 7.5 today.  Monitor H&H.  Transfuse if hemoglobin is less than 7.  Received IV iron on 07/14/2019  Acute on chronic hypoxic respiratory failure Acute on chronic diastolic heart failure COPD -Mechanical ventilation from 07/04/2019-07/09/2019 -Continue BiPAP nightly and daytime during naps -Diuretics were briefly held for few days giving worsening sodium went overnight on 07/16/2019, patient was more hypoxic with chest x-ray showing fluid overload and subsequently intravenous Lasix was restarted.  Strict input and output.  Daily weights.  Fluid restriction. -Currently requiring 2 L oxygen via nasal cannula intermittently.  Will get chest x-ray today.  Acute kidney injury on chronic kidney disease stage IIIb -Creatinine relatively stable now in the 2.5 range.  Creatinine 2.33 today.  Monitor  Hypomagnesemia -Replace.  Repeat a.m. labs  Hepatic encephalopathy Hepatitis C Liver cirrhosis -Continue lactulose and Lasix.  Overall poor prognosis.  Diabetes mellitus type 2 -Continue CBGs with SSI.  Generalized deconditioning -Overall prognosis is very poor because of multiple serious medical problems including vascular disease/CAD//COPD/OSA/chronic renal disease stage IIIb/liver cirrhosis/multiple frequent hospitalizations -Palliative care has evaluated the patient and has had family discussion during the hospitalization.  Recommend outpatient palliative care  follow-up. -If condition worsens, consider hospice/comfort measures -will need SNF placement versus LTACH   DVT prophylaxis: Lovenox Code Status: DNR Family  Communication: None at bedside Disposition Plan: Status is: Inpatient  Remains inpatient appropriate because:Inpatient level of care appropriate due to severity of illness   Dispo: The patient is from: SNF              Anticipated d/c is to: SNF/LTACH              Anticipated d/c date is: 2 days              Patient currently is not medically stable to d/c.  Consultants: PCCM/vascular surgery/palliative  Procedures:  Right above-knee amputation on 07/04/2019  Antimicrobials:  Anti-infectives (From admission, onward)   Start     Dose/Rate Route Frequency Ordered Stop   07/02/19 1200  vancomycin (VANCOCIN) IVPB 1000 mg/200 mL premix        1,000 mg 200 mL/hr over 60 Minutes Intravenous Every 48 hours 07/01/19 1228 07/06/19 2359   06/28/19 1000  vancomycin (VANCOREADY) IVPB 1250 mg/250 mL  Status:  Discontinued        1,250 mg 166.7 mL/hr over 90 Minutes Intravenous Every 24 hours 06/27/19 1223 07/01/19 1213   06/27/19 1300  ceFEPIme (MAXIPIME) 2 g in sodium chloride 0.9 % 100 mL IVPB        2 g 200 mL/hr over 30 Minutes Intravenous Every 12 hours 06/27/19 1223 07/07/19 0021   06/27/19 1230  vancomycin (VANCOREADY) IVPB 2000 mg/400 mL        2,000 mg 200 mL/hr over 120 Minutes Intravenous  Once 06/27/19 1223 06/27/19 1743       Subjective: Patient seen and examined at bedside.  Extremely poor historian.  Wakes up slightly, does not answer much questions.  No overnight fever or vomiting reported per nursing staff.  Patient had an episode of bright red/brown stool overnight as per nursing staff.  Objective: Vitals:   07/17/19 1140 07/17/19 2033 07/17/19 2340 07/18/19 0459  BP: (!) 146/72 (!) 146/74 (!) 149/73 (!) 154/71  Pulse: 87 92 87 93  Resp: 18 18 18 19   Temp: 98.4 F (36.9 C) 98.1 F (36.7 C) 98.5 F (36.9  C) 99 F (37.2 C)  TempSrc: Oral Oral Oral Oral  SpO2: 99% 99% 99% 91%  Weight:    86 kg  Height:        Intake/Output Summary (Last 24 hours) at 07/18/2019 0732 Last data filed at 07/18/2019 0459 Gross per 24 hour  Intake 480 ml  Output 1650 ml  Net -1170 ml   Filed Weights   07/16/19 0531 07/17/19 0053 07/18/19 0459  Weight: 96.1 kg 93.6 kg 86 kg    Examination:  General exam: No acute distress.  Looks chronically ill and very deconditioned. Respiratory system: Bilateral decreased breath sounds at bases with no wheezing.  Scattered crackles heard  cardiovascular system: Rate controlled, S1-S2 heard Gastrointestinal system: Abdomen is slightly distended, soft and nontender.  Bowel sounds heard  extremities: Right AKA present.  No cyanosis.  Ischemic changes in left foot with amputation of left great toe Central nervous system: Still slightly confused to time; very poor historian; very slow to respond. no focal neurological deficits. Moving extremities Skin: No other ecchymosis or petechiae Psychiatry: Cannot assess because of mental status    Data Reviewed: I have personally reviewed following labs and imaging studies  CBC: Recent Labs  Lab 07/14/19 0312 07/15/19 0406 07/16/19 0340 07/17/19 0501 07/18/19 0118  WBC 8.7 10.8* 8.9 9.4 9.1  NEUTROABS  --   --   --   --  7.2  HGB 7.9* 7.4* 7.1* 7.6* 7.5*  HCT 27.3* 26.1* 25.2* 26.7* 26.1*  MCV 96.1 97.4 96.2 97.1 97.8  PLT 141* 132* 144* 160 174   Basic Metabolic Panel: Recent Labs  Lab 07/14/19 0312 07/15/19 0406 07/16/19 0340 07/17/19 0501 07/18/19 0118  NA 145 146* 145 144 146*  K 4.4 4.6 4.1 4.2 4.1  CL 110 115* 112* 111 111  CO2 24 24 24 23 26   GLUCOSE 106* 164* 117* 148* 143*  BUN 45* 48* 45* 47* 50*  CREATININE 2.35* 2.45* 2.35* 2.32* 2.33*  CALCIUM 8.6* 8.7* 8.6* 8.8* 8.7*  MG  --   --   --   --  1.6*   GFR: Estimated Creatinine Clearance: 26.4 mL/min (A) (by C-G formula based on SCr of 2.33 mg/dL  (H)). Liver Function Tests: Recent Labs  Lab 07/13/19 0559 07/17/19 0501 07/18/19 0118  AST 17 15 19   ALT 13 12 12   ALKPHOS 134* 120 111  BILITOT 1.2 1.4* 1.4*  PROT 8.2* 8.6* 8.5*  ALBUMIN 2.1* 2.2* 2.2*   No results for input(s): LIPASE, AMYLASE in the last 168 hours. Recent Labs  Lab 07/15/19 1022 07/18/19 0118  AMMONIA 57* 52*   Coagulation Profile: No results for input(s): INR, PROTIME in the last 168 hours. Cardiac Enzymes: No results for input(s): CKTOTAL, CKMB, CKMBINDEX, TROPONINI in the last 168 hours. BNP (last 3 results) No results for input(s): PROBNP in the last 8760 hours. HbA1C: No results for input(s): HGBA1C in the last 72 hours. CBG: Recent Labs  Lab 07/17/19 0616 07/17/19 1057 07/17/19 1635 07/17/19 2149 07/18/19 0633  GLUCAP 129* 154* 112* 138* 129*   Lipid Profile: No results for input(s): CHOL, HDL, LDLCALC, TRIG, CHOLHDL, LDLDIRECT in the last 72 hours. Thyroid Function Tests: No results for input(s): TSH, T4TOTAL, FREET4, T3FREE, THYROIDAB in the last 72 hours. Anemia Panel: No results for input(s): VITAMINB12, FOLATE, FERRITIN, TIBC, IRON, RETICCTPCT in the last 72 hours. Sepsis Labs: No results for input(s): PROCALCITON, LATICACIDVEN in the last 168 hours.  No results found for this or any previous visit (from the past 240 hour(s)).       Radiology Studies: No results found.      Scheduled Meds: . sodium chloride   Intravenous Once  . atorvastatin  40 mg Oral Daily  . feeding supplement (PRO-STAT SUGAR FREE 64)  30 mL Oral TID  . folic acid  1 mg Oral Daily  . furosemide  40 mg Intravenous BID  . hydrocortisone cream   Topical BID  . insulin aspart  0-15 Units Subcutaneous TID WC  . lactulose  20 g Oral BID  . mouth rinse  15 mL Mouth Rinse BID  . multivitamin with minerals  1 tablet Oral Daily  . pantoprazole sodium  40 mg Oral Daily   Continuous Infusions: . sodium chloride Stopped (07/11/19 9449)           Aline August, MD [facility] 07/18/2019, 7:32 AM

## 2019-07-18 NOTE — Progress Notes (Signed)
CSW received consult for LTACH. Patient does not qualify for LTACH with Medicaid.   Salahuddin Arismendez LCSW

## 2019-07-19 LAB — CBC WITH DIFFERENTIAL/PLATELET
Abs Immature Granulocytes: 0.05 10*3/uL (ref 0.00–0.07)
Basophils Absolute: 0.1 10*3/uL (ref 0.0–0.1)
Basophils Relative: 1 %
Eosinophils Absolute: 0.5 10*3/uL (ref 0.0–0.5)
Eosinophils Relative: 5 %
HCT: 27.6 % — ABNORMAL LOW (ref 36.0–46.0)
Hemoglobin: 7.6 g/dL — ABNORMAL LOW (ref 12.0–15.0)
Immature Granulocytes: 1 %
Lymphocytes Relative: 7 %
Lymphs Abs: 0.7 10*3/uL (ref 0.7–4.0)
MCH: 27.4 pg (ref 26.0–34.0)
MCHC: 27.5 g/dL — ABNORMAL LOW (ref 30.0–36.0)
MCV: 99.6 fL (ref 80.0–100.0)
Monocytes Absolute: 0.5 10*3/uL (ref 0.1–1.0)
Monocytes Relative: 6 %
Neutro Abs: 7.1 10*3/uL (ref 1.7–7.7)
Neutrophils Relative %: 80 %
Platelets: 181 10*3/uL (ref 150–400)
RBC: 2.77 MIL/uL — ABNORMAL LOW (ref 3.87–5.11)
RDW: 24 % — ABNORMAL HIGH (ref 11.5–15.5)
WBC: 8.9 10*3/uL (ref 4.0–10.5)
nRBC: 0.3 % — ABNORMAL HIGH (ref 0.0–0.2)

## 2019-07-19 LAB — COMPREHENSIVE METABOLIC PANEL
ALT: 11 U/L (ref 0–44)
AST: 15 U/L (ref 15–41)
Albumin: 2.2 g/dL — ABNORMAL LOW (ref 3.5–5.0)
Alkaline Phosphatase: 104 U/L (ref 38–126)
Anion gap: 11 (ref 5–15)
BUN: 50 mg/dL — ABNORMAL HIGH (ref 8–23)
CO2: 28 mmol/L (ref 22–32)
Calcium: 8.8 mg/dL — ABNORMAL LOW (ref 8.9–10.3)
Chloride: 108 mmol/L (ref 98–111)
Creatinine, Ser: 2.23 mg/dL — ABNORMAL HIGH (ref 0.44–1.00)
GFR calc Af Amer: 26 mL/min — ABNORMAL LOW (ref >60)
GFR calc non Af Amer: 23 mL/min — ABNORMAL LOW (ref >60)
Glucose, Bld: 171 mg/dL — ABNORMAL HIGH (ref 70–99)
Potassium: 3.9 mmol/L (ref 3.5–5.1)
Sodium: 147 mmol/L — ABNORMAL HIGH (ref 135–145)
Total Bilirubin: 1 mg/dL (ref 0.3–1.2)
Total Protein: 8.8 g/dL — ABNORMAL HIGH (ref 6.5–8.1)

## 2019-07-19 LAB — GLUCOSE, CAPILLARY
Glucose-Capillary: 138 mg/dL — ABNORMAL HIGH (ref 70–99)
Glucose-Capillary: 141 mg/dL — ABNORMAL HIGH (ref 70–99)
Glucose-Capillary: 117 mg/dL — ABNORMAL HIGH (ref 70–99)
Glucose-Capillary: 105 mg/dL — ABNORMAL HIGH (ref 70–99)

## 2019-07-19 LAB — MAGNESIUM: Magnesium: 1.8 mg/dL (ref 1.7–2.4)

## 2019-07-19 NOTE — Progress Notes (Signed)
° °  Palliative Medicine Inpatient Follow Up Note   HPI: 64 y.o. female  with past medical history of chronic systolic heart failure, COPD, DM2, DOE, OSA, Hep C, other cirrhosis, CKD III, lower extremity cellulitis, lower extremity venous stasis ulcers,  admitted on 06/27/2019 with complaints of altered mental status. Workup reveals metabolic encephalopathy in the setting of infectious process related to lower extremity cellulitis, myositis, osteoitis. Vascular surgery consulted and recommend BKA. Spouse initially has not consented for surgery. Palliative consulted for [place].    Patient since that time underwent a AKA and since has not been able to tolerate ventilatory weaning. We were asked to speak to patients husband about goals of care moving forward being that she will likely get extubated though there will most probably be another event causing hospitalization given her comorbid conditions.  Today's Discussion (07/19/2019): Chart reviewed. Patient remains on 2-3LPM [place]. Eating 25-50% of meals. She is alert to name this morning. We discussed trying to find her placement.   Discussed the importance of continued conversation with family and their  medical providers regarding overall plan of care and treatment options, ensuring decisions are within the context of the patients values and GOCs.  Questions and concerns addressed   SUMMARY OF RECOMMENDATIONS - DNR/DNI - IP Palliative will sign off, pls contact for any additional needs  Time Spent: 15 minutes Greater than 50% of the time was spent in counseling and coordination of care ______________________________________________________________________________________ [place] Team Team Cell Phone: [phone] Please utilize secure chat with additional questions, if there is no response within 30 minutes please call the above phone number  Palliative Medicine Team providers are available by phone from  7am to 7pm daily and can be reached through the team cell phone.  Should this patient require assistance outside of these hours, please call the patient's attending physician.

## 2019-07-19 NOTE — Plan of Care (Signed)
  Problem: Clinical Measurements: Goal: Will remain free from infection Outcome: Progressing   Problem: Activity: Goal: Risk for activity intolerance will decrease Outcome: Progressing   Problem: Safety: Goal: Ability to remain free from injury will improve Outcome: Progressing   

## 2019-07-19 NOTE — Progress Notes (Signed)
Patient ID: [name], female   DOB: 1955/10/28, 64 y.o.   MRN: [id number]  PROGRESS NOTE    MAURENE HOLLIN  MBW:[id number] DOB: 27-Oct-1955 DOA: 06/27/2019 PCP: [facility]   Brief Narrative:  64 year old chronically ill female resident of [facility] with multiple medical problems including CAD, COPD, CKD stage IIIb, chronic diastolic CHF, hep C, cirrhosis was admitted with sepsis, encephalopathy and extensive full-thickness tissue loss of right foot. MRI indicated right foot abscess, myositis and tenosynovitis as well as left foot myositis and cellulitis. 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  She was unable to be extubated postop and was admitted to the ICU on 07/04/19.  [facility] course complicated by encephalopathy, has been treated for hepatic encephalopathy with lactulose -Also noted to have dysphagia requiring NG tube, tube feeds -Prognosis felt to be very poor, palliative medicine team was consulted  -Also treated for postop blood loss anemia and hemorrhagic shock -She was transferred from [facility] to [facility] service  0/17/79 -Complicated by ongoing failure to thrive, poor p.o. intake, palliative care discussions, plan for possible SNF with palliative care when stable, high risk of complications and rehospitalizations. -Finally cortrak removed and started dysphagia diet - On 07/15/19, she became more lethargic, ammonia level went up to 57, lactulose dose increased again -Overnight of 07/16/2019, she had worsening dyspnea with chest x-ray notable for fluid overload.   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  Monitor mental status. -Fall precautions.  PT/OT recommends SNF placement -SLP following.  Tube feeds discontinued.  Started oral feeding but oral intake remains poor  Hemorrhagic shock Postop bleeding -Appears to have resolved.  Status post 7 units packed red cells transfusion during this hospitalization.  Hemoglobin 7.6 today.  Monitor H&H.  Transfuse if hemoglobin is less than 7.  Received IV iron on 07/14/2019  Acute on chronic hypoxic respiratory failure Acute on chronic diastolic heart failure COPD -Mechanical ventilation from 07/04/2019-07/09/2019 -Continue BiPAP nightly and daytime during naps -Diuretics were briefly held for few days giving worsening sodium went overnight on 07/16/2019, patient was more hypoxic with chest x-ray showing fluid overload and subsequently intravenous Lasix was restarted.  Strict input and output.  Daily weights.  Fluid restriction. -Currently requiring 2 L oxygen via nasal cannula intermittently.  Chest x-ray from 07/18/2019 did show mild bibasilar atelectasis or edema with probable small pleural effusions.  Lasix dose was increased to 80 mg IV every 12 hours on 07/18/2019.  Acute kidney injury on chronic kidney disease stage IIIb -Creatinine relatively stable now in the 2.5 range.  Creatinine 2.23 today.  Monitor  Hypomagnesemia -Improved  Hepatic encephalopathy Hepatitis C Liver cirrhosis -Continue lactulose and Lasix.  Overall poor prognosis.  Diabetes mellitus type 2 -Continue CBGs with SSI.  Generalized deconditioning -Overall prognosis is very poor because of multiple serious medical problems including vascular disease/CAD//COPD/OSA/chronic renal disease stage IIIb/liver cirrhosis/multiple frequent  hospitalizations -Palliative care has evaluated the patient and has had family discussion during the hospitalization.  Recommend outpatient palliative care follow-up. -  If condition worsens, consider hospice/comfort measures -will need SNF placement.  Not a candidate for LTACH as per social worker   DVT prophylaxis: Lovenox Code Status: DNR Family Communication: None at bedside Disposition Plan: Status is: Inpatient  Remains inpatient appropriate because:Inpatient level of care appropriate due to severity of illness   Dispo: The patient is from: SNF              Anticipated d/c is to: SNF              Anticipated d/c date is: 2 days              Patient currently is not medically stable to d/c.  Consultants: PCCM/vascular surgery/palliative  Procedures:  Right above-knee amputation on 07/04/2019  Antimicrobials:  Anti-infectives (From admission, onward)   Start     Dose/Rate Route Frequency Ordered Stop   07/02/19 1200  vancomycin (VANCOCIN) IVPB 1000 mg/200 mL premix        1,000 mg 200 mL/hr over 60 Minutes Intravenous Every 48 hours 07/01/19 1228 07/06/19 2359   06/28/19 1000  vancomycin (VANCOREADY) IVPB 1250 mg/250 mL  Status:  Discontinued        1,250 mg 166.7 mL/hr over 90 Minutes Intravenous Every 24 hours 06/27/19 1223 07/01/19 1213   06/27/19 1300  ceFEPIme (MAXIPIME) 2 g in sodium chloride 0.9 % 100 mL IVPB        2 g 200 mL/hr over 30 Minutes Intravenous Every 12 hours 06/27/19 1223 07/07/19 0021   06/27/19 1230  vancomycin (VANCOREADY) IVPB 2000 mg/400 mL        2,000 mg 200 mL/hr over 120 Minutes Intravenous  Once 06/27/19 1223 06/27/19 1743       Subjective: Patient seen and examined at bedside.  Very poor historian.  Wakes up slightly, does not communicate much or answer questions.  No overnight fever, vomiting, black or bloody stools reported by nursing staff. Objective: Vitals:   07/18/19 0459 07/18/19 1127 07/18/19 2022 07/19/19 0351  BP: (!) 154/71  (!) 141/78 133/71 (!) 147/71  Pulse: 93 85 81 87  Resp: 19 18 19 19   Temp: 99 F (37.2 C) 98 F (36.7 C) 97.8 F (36.6 C) 97.8 F (36.6 C)  TempSrc: Oral Oral Oral Oral  SpO2: 91% 100% 99% 93%  Weight: 86 kg   85 kg  Height:        Intake/Output Summary (Last 24 hours) at 07/19/2019 0722 Last data filed at 07/19/2019 0120 Gross per 24 hour  Intake 240 ml  Output 1900 ml  Net -1660 ml   Filed Weights   07/17/19 0053 07/18/19 0459 07/19/19 0351  Weight: 93.6 kg 86 kg 85 kg    Examination:  General exam: No distress.  Looks chronically ill and very deconditioned. Respiratory system: Bilateral decreased breath sounds at bases with scattered crackles cardiovascular system: S1-S2 heard, rate controlled  gastrointestinal system: Abdomen is obese, slightly distended, soft and nontender.  Bowel sounds heard  extremities: Trace lower extremity edema present.  Right AKA present.  No clubbing ischemic changes in left foot with amputation of left great toe Central nervous system: Still slightly confused.  Very poor historian; very slow to respond. no focal neurological deficits. Moving extremities Skin: No other lesions/petechiae Psychiatry: Could not be assessed because of mental status    Data Reviewed: I have personally reviewed following labs and imaging studies  CBC: Recent Labs  Lab 07/15/19 0406 07/16/19 0340 07/17/19 0501 07/18/19 0118 07/19/19 9675  WBC 10.8* 8.9 9.4 9.1 8.9  NEUTROABS  --   --   --  7.2 7.1  HGB 7.4* 7.1* 7.6* 7.5* 7.6*  HCT 26.1* 25.2* 26.7* 26.1* 27.6*  MCV 97.4 96.2 97.1 97.8 99.6  PLT 132* 144* 160 158 407   Basic Metabolic Panel: Recent Labs  Lab 07/15/19 0406 07/16/19 0340 07/17/19 0501 07/18/19 0118 07/19/19 0352  NA 146* 145 144 146* 147*  K 4.6 4.1 4.2 4.1 3.9  CL 115* 112* 111 111 108  CO2 24 24 23 26 28   GLUCOSE 164* 117* 148* 143* 171*  BUN 48* 45* 47* 50* 50*  CREATININE 2.45* 2.35* 2.32* 2.33* 2.23*  CALCIUM 8.7* 8.6* 8.8*  8.7* 8.8*  MG  --   --   --  1.6* 1.8   GFR: Estimated Creatinine Clearance: 27.4 mL/min (A) (by C-G formula based on SCr of 2.23 mg/dL (H)). Liver Function Tests: Recent Labs  Lab 07/13/19 0559 07/17/19 0501 07/18/19 0118 07/19/19 0352  AST 17 15 19 15   ALT 13 12 12 11   ALKPHOS 134* 120 111 104  BILITOT 1.2 1.4* 1.4* 1.0  PROT 8.2* 8.6* 8.5* 8.8*  ALBUMIN 2.1* 2.2* 2.2* 2.2*   No results for input(s): LIPASE, AMYLASE in the last 168 hours. Recent Labs  Lab 07/15/19 1022 07/18/19 0118  AMMONIA 57* 52*   Coagulation Profile: No results for input(s): INR, PROTIME in the last 168 hours. Cardiac Enzymes: No results for input(s): CKTOTAL, CKMB, CKMBINDEX, TROPONINI in the last 168 hours. BNP (last 3 results) No results for input(s): PROBNP in the last 8760 hours. HbA1C: No results for input(s): HGBA1C in the last 72 hours. CBG: Recent Labs  Lab 07/18/19 0633 07/18/19 1102 07/18/19 1620 07/18/19 2108 07/19/19 0619  GLUCAP 129* 118* 116* 131* 138*   Lipid Profile: No results for input(s): CHOL, HDL, LDLCALC, TRIG, CHOLHDL, LDLDIRECT in the last 72 hours. Thyroid Function Tests: No results for input(s): TSH, T4TOTAL, FREET4, T3FREE, THYROIDAB in the last 72 hours. Anemia Panel: No results for input(s): VITAMINB12, FOLATE, FERRITIN, TIBC, IRON, RETICCTPCT in the last 72 hours. Sepsis Labs: No results for input(s): PROCALCITON, LATICACIDVEN in the last 168 hours.  No results found for this or any previous visit (from the past 240 hour(s)).       Radiology Studies: DG CHEST PORT 1 VIEW  Result Date: 07/18/2019 CLINICAL DATA:  Dyspnea. EXAM: PORTABLE CHEST 1 VIEW COMPARISON:  July 15, 2019. FINDINGS: Stable cardiomegaly. Status post aortic valve repair. No pneumothorax is noted. Mild bibasilar atelectasis or edema is noted with probable small pleural effusions, left greater than right. Bony thorax is unremarkable. IMPRESSION: Mild bibasilar atelectasis or edema is  noted with probable small pleural effusions, left greater than right. Electronically Signed   By: Marijo Conception M.D.   On: 07/18/2019 10:02        Scheduled Meds: . sodium chloride   Intravenous Once  . atorvastatin  40 mg Oral Daily  . feeding supplement (PRO-STAT SUGAR FREE 64)  30 mL Oral TID  . folic acid  1 mg Oral Daily  . furosemide  80 mg Intravenous BID  . hydrocortisone cream   Topical BID  . insulin aspart  0-15 Units Subcutaneous TID WC  . lactulose  20 g Oral BID  . mouth rinse  15 mL Mouth Rinse BID  . multivitamin with minerals  1 tablet Oral Daily  . pantoprazole sodium  40 mg Oral Daily   Continuous Infusions: . sodium  chloride Stopped (07/11/19 0475)          Aline August, MD [facility] 07/19/2019, 7:22 AM

## 2019-07-19 NOTE — Progress Notes (Signed)
Palliative Medicine RN Note: Discussed patient in team rounds.   Our team has been involved with Mrs Arentz's care since 6/14. GOC are clear, and there is no further role for our team. Barriers to discharge now are all related to her insurance and placement.  At this time, we will sign off. Please call us/re-consult [place] if Mrs Jurich's family requests to speak to [place].  Marjie Skiff Kailah Pennel, RN, BSN, [facility] Palliative Medicine Team 07/19/2019 10:05 AM Office [phone]

## 2019-07-20 LAB — CBC WITH DIFFERENTIAL/PLATELET
Abs Immature Granulocytes: 0.05 10*3/uL (ref 0.00–0.07)
Basophils Absolute: 0.1 10*3/uL (ref 0.0–0.1)
Basophils Relative: 1 %
Eosinophils Absolute: 0.4 10*3/uL (ref 0.0–0.5)
Eosinophils Relative: 5 %
HCT: 29.1 % — ABNORMAL LOW (ref 36.0–46.0)
Hemoglobin: 8.2 g/dL — ABNORMAL LOW (ref 12.0–15.0)
Immature Granulocytes: 1 %
Lymphocytes Relative: 10 %
Lymphs Abs: 0.7 10*3/uL (ref 0.7–4.0)
MCH: 28.5 pg (ref 26.0–34.0)
MCHC: 28.2 g/dL — ABNORMAL LOW (ref 30.0–36.0)
MCV: 101 fL — ABNORMAL HIGH (ref 80.0–100.0)
Monocytes Absolute: 0.4 10*3/uL (ref 0.1–1.0)
Monocytes Relative: 6 %
Neutro Abs: 5.5 10*3/uL (ref 1.7–7.7)
Neutrophils Relative %: 77 %
Platelets: 180 10*3/uL (ref 150–400)
RBC: 2.88 MIL/uL — ABNORMAL LOW (ref 3.87–5.11)
RDW: 23.8 % — ABNORMAL HIGH (ref 11.5–15.5)
WBC: 7.2 10*3/uL (ref 4.0–10.5)
nRBC: 0.3 % — ABNORMAL HIGH (ref 0.0–0.2)

## 2019-07-20 LAB — GLUCOSE, CAPILLARY
Glucose-Capillary: 107 mg/dL — ABNORMAL HIGH (ref 70–99)
Glucose-Capillary: 106 mg/dL — ABNORMAL HIGH (ref 70–99)
Glucose-Capillary: 134 mg/dL — ABNORMAL HIGH (ref 70–99)
Glucose-Capillary: 174 mg/dL — ABNORMAL HIGH (ref 70–99)

## 2019-07-20 LAB — MAGNESIUM: Magnesium: 1.7 mg/dL (ref 1.7–2.4)

## 2019-07-20 LAB — BASIC METABOLIC PANEL
Anion gap: 10 (ref 5–15)
BUN: 45 mg/dL — ABNORMAL HIGH (ref 8–23)
CO2: 32 mmol/L (ref 22–32)
Calcium: 8.7 mg/dL — ABNORMAL LOW (ref 8.9–10.3)
Chloride: 108 mmol/L (ref 98–111)
Creatinine, Ser: 2.21 mg/dL — ABNORMAL HIGH (ref 0.44–1.00)
GFR calc Af Amer: 26 mL/min — ABNORMAL LOW (ref >60)
GFR calc non Af Amer: 23 mL/min — ABNORMAL LOW (ref >60)
Glucose, Bld: 112 mg/dL — ABNORMAL HIGH (ref 70–99)
Potassium: 3.9 mmol/L (ref 3.5–5.1)
Sodium: 150 mmol/L — ABNORMAL HIGH (ref 135–145)

## 2019-07-20 NOTE — Plan of Care (Signed)
  Problem: Safety: Goal: Ability to remain free from injury will improve Outcome: Progressing   

## 2019-07-20 NOTE — Progress Notes (Addendum)
Patient ID: [name], female   DOB: [birth year], 64 y.o.   MRN: [id number]  PROGRESS NOTE    [name]  XVQ:[id number] DOB: April 01, 1955 DOA: 06/27/2019 PCP: [place]   Brief Narrative:  64 year old chronically ill female resident of [facility] with multiple medical problems including CAD, COPD, CKD stage IIIb, chronic diastolic CHF, hep C, cirrhosis was admitted with sepsis, encephalopathy and extensive full-thickness tissue loss of right foot. MRI indicated right foot abscess, myositis and tenosynovitis as well as left foot myositis and cellulitis. 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  She was unable to be extubated postop and was admitted to the ICU on 07/04/19.  [facility] course complicated by encephalopathy, has been treated for hepatic encephalopathy with lactulose -Also noted to have dysphagia requiring NG tube, tube feeds -Prognosis felt to be very poor, palliative medicine team was consulted  -Also treated for postop blood loss anemia and hemorrhagic shock -She was transferred from [facility] to [facility] service  0/93/26 -Complicated by ongoing failure to thrive, poor p.o. intake, palliative care discussions, plan for possible SNF with palliative care when stable, high risk of complications and rehospitalizations. -Finally cortrak removed and started dysphagia diet - On 07/15/19, she became more lethargic, ammonia level went up to 57, lactulose dose increased again -Overnight of 07/16/2019, she had worsening dyspnea with chest x-ray notable for fluid overload.   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  Monitor mental status. -Fall precautions.  PT/OT recommends SNF placement -SLP following.  Tube feeds discontinued.  Started oral feeding but oral intake remains poor  Hemorrhagic shock Postop bleeding -Appears to have resolved.  Status post 7 units packed red cells transfusion during this hospitalization.  Hemoglobin 8.2 today.  Monitor H&H.  Transfuse if hemoglobin is less than 7.  Received IV iron on 07/14/2019  Acute on chronic hypoxic respiratory failure Acute on chronic diastolic heart failure COPD -Mechanical ventilation from 07/04/2019-07/09/2019 -Continue BiPAP nightly and daytime during naps -Diuretics were briefly held for few days giving worsening sodium went overnight on 07/16/2019, patient was more hypoxic with chest x-ray showing fluid overload and subsequently intravenous Lasix was restarted.  Strict input and output.  Daily weights.  Fluid restriction. -Currently requiring 2 L oxygen via nasal cannula intermittently.  Chest x-ray from 07/18/2019 did show mild bibasilar atelectasis or edema with probable small pleural effusions.  Lasix dose was increased to 80 mg IV every 12 hours on 07/18/2019.  DC Lasix today since sodium level is rising to 150.  Acute kidney injury on chronic kidney disease stage IIIb -Creatinine relatively stable now in the 2.5 range.  Creatinine 2.21 today.  Monitor  Hypomagnesemia -Improved  Hepatic encephalopathy Hepatitis C Liver cirrhosis -Continue lactulose and Lasix.  Overall poor prognosis.  Diabetes mellitus type 2 -Continue CBGs with SSI.  Generalized deconditioning -Overall prognosis is very poor because of multiple serious medical problems including vascular disease/CAD//COPD/OSA/chronic renal disease stage IIIb/liver  cirrhosis/multiple frequent hospitalizations -Palliative care has evaluated the patient and has had  family discussion during the hospitalization.  Recommend outpatient palliative care follow-up. -If condition worsens, consider hospice/comfort measures -will need SNF placement.  Not a candidate for LTACH as per social worker   DVT prophylaxis: Lovenox Code Status: DNR Family Communication: None at bedside Disposition Plan: Status is: Inpatient  Remains inpatient appropriate because:Inpatient level of care appropriate due to severity of illness   Dispo: The patient is from: SNF              Anticipated d/c is to: SNF              Anticipated d/c date is: 1 day              Patient currently is medically stable to d/c.  Consultants: PCCM/vascular surgery/palliative  Procedures:  Right above-knee amputation on 07/04/2019  Antimicrobials:  Anti-infectives (From admission, onward)   Start     Dose/Rate Route Frequency Ordered Stop   07/02/19 1200  vancomycin (VANCOCIN) IVPB 1000 mg/200 mL premix        1,000 mg 200 mL/hr over 60 Minutes Intravenous Every 48 hours 07/01/19 1228 07/06/19 2359   06/28/19 1000  vancomycin (VANCOREADY) IVPB 1250 mg/250 mL  Status:  Discontinued        1,250 mg 166.7 mL/hr over 90 Minutes Intravenous Every 24 hours 06/27/19 1223 07/01/19 1213   06/27/19 1300  ceFEPIme (MAXIPIME) 2 g in sodium chloride 0.9 % 100 mL IVPB        2 g 200 mL/hr over 30 Minutes Intravenous Every 12 hours 06/27/19 1223 07/07/19 0021   06/27/19 1230  vancomycin (VANCOREADY) IVPB 2000 mg/400 mL        2,000 mg 200 mL/hr over 120 Minutes Intravenous  Once 06/27/19 1223 06/27/19 1743       Subjective: Patient seen and examined at bedside.  Poor historian.  Wakes up slightly, hardly participates in any communication.  No overnight fever, vomiting, worsening shortness of breath reported.   Objective: Vitals:   07/19/19 0351 07/19/19 1202 07/19/19 2022 07/20/19 0302  BP: (!)  147/71 (!) 151/69 132/68 (!) 153/82  Pulse: 87 90 80 84  Resp: 19 18 19 19   Temp: 97.8 F (36.6 C) 97.8 F (36.6 C) 97.7 F (36.5 C) 97.8 F (36.6 C)  TempSrc: Oral Oral Oral Oral  SpO2: 93% 91% 100% 94%  Weight: 85 kg   80 kg  Height:        Intake/Output Summary (Last 24 hours) at 07/20/2019 0729 Last data filed at 07/20/2019 0302 Gross per 24 hour  Intake 360 ml  Output 2100 ml  Net -1740 ml   Filed Weights   07/18/19 0459 07/19/19 0351 07/20/19 0302  Weight: 86 kg 85 kg 80 kg    Examination:  General exam: No acute distress.  Looks chronically ill and very deconditioned. Respiratory system: Bilateral decreased breath sounds at bases with some crackles.  No wheezing  cardiovascular system: Rate controlled, S1-S2 heard gastrointestinal system: Abdomen is slightly distended, soft and nontender.  Normal bowel sounds heard extremities: Mild left lower extremity edema present right AKA present.  No clubbing ischemic changes in left foot with amputation of left great toe Central nervous system: Still very slow to respond and slightly confused.  Very poor historian; very slow to respond. no focal neurological deficits. Moving extremities Skin: No obvious other ecchymosis/lesions Psychiatry: Cannot assess because of mental status    Data Reviewed: I have personally reviewed following labs and imaging studies  CBC: Recent Labs  Lab 07/16/19 0340 07/17/19 0501 07/18/19 0118 07/19/19 0352 07/20/19 0519  WBC 8.9 9.4 9.1 8.9 7.2  NEUTROABS  --   --  7.2 7.1 5.5  HGB 7.1* 7.6* 7.5* 7.6* 8.2*  HCT 25.2* 26.7* 26.1* 27.6* 29.1*  MCV 96.2 97.1 97.8 99.6 101.0*  PLT 144* 160 158 181 782   Basic Metabolic Panel: Recent Labs  Lab 07/16/19 0340 07/17/19 0501 07/18/19 0118 07/19/19 0352 07/20/19 0519  NA 145 144 146* 147* 150*  K 4.1 4.2 4.1 3.9 3.9  CL 112* 111 111 108 108  CO2 24 23 26 28  32  GLUCOSE 117* 148* 143* 171* 112*  BUN 45* 47* 50* 50* 45*  CREATININE 2.35*  2.32* 2.33* 2.23* 2.21*  CALCIUM 8.6* 8.8* 8.7* 8.8* 8.7*  MG  --   --  1.6* 1.8 1.7   GFR: Estimated Creatinine Clearance: 26.9 mL/min (A) (by C-G formula based on SCr of 2.21 mg/dL (H)). Liver Function Tests: Recent Labs  Lab 07/17/19 0501 07/18/19 0118 07/19/19 0352  AST 15 19 15   ALT 12 12 11   ALKPHOS 120 111 104  BILITOT 1.4* 1.4* 1.0  PROT 8.6* 8.5* 8.8*  ALBUMIN 2.2* 2.2* 2.2*   No results for input(s): LIPASE, AMYLASE in the last 168 hours. Recent Labs  Lab 07/15/19 1022 07/18/19 0118  AMMONIA 57* 52*   Coagulation Profile: No results for input(s): INR, PROTIME in the last 168 hours. Cardiac Enzymes: No results for input(s): CKTOTAL, CKMB, CKMBINDEX, TROPONINI in the last 168 hours. BNP (last 3 results) No results for input(s): PROBNP in the last 8760 hours. HbA1C: No results for input(s): HGBA1C in the last 72 hours. CBG: Recent Labs  Lab 07/19/19 0619 07/19/19 1159 07/19/19 1609 07/19/19 2117 07/20/19 0603  GLUCAP 138* 141* 117* 105* 107*   Lipid Profile: No results for input(s): CHOL, HDL, LDLCALC, TRIG, CHOLHDL, LDLDIRECT in the last 72 hours. Thyroid Function Tests: No results for input(s): TSH, T4TOTAL, FREET4, T3FREE, THYROIDAB in the last 72 hours. Anemia Panel: No results for input(s): VITAMINB12, FOLATE, FERRITIN, TIBC, IRON, RETICCTPCT in the last 72 hours. Sepsis Labs: No results for input(s): PROCALCITON, LATICACIDVEN in the last 168 hours.  No results found for this or any previous visit (from the past 240 hour(s)).       Radiology Studies: DG CHEST PORT 1 VIEW  Result Date: 07/18/2019 CLINICAL DATA:  Dyspnea. EXAM: PORTABLE CHEST 1 VIEW COMPARISON:  July 15, 2019. FINDINGS: Stable cardiomegaly. Status post aortic valve repair. No pneumothorax is noted. Mild bibasilar atelectasis or edema is noted with probable small pleural effusions, left greater than right. Bony thorax is unremarkable. IMPRESSION: Mild bibasilar atelectasis or  edema is noted with probable small pleural effusions, left greater than right. Electronically Signed   By: Marijo Conception M.D.   On: 07/18/2019 10:02        Scheduled Meds: . sodium chloride   Intravenous Once  . atorvastatin  40 mg Oral Daily  . feeding supplement (PRO-STAT SUGAR FREE 64)  30 mL Oral TID  . folic acid  1 mg Oral Daily  . hydrocortisone cream   Topical BID  . insulin aspart  0-15 Units Subcutaneous TID WC  . lactulose  20 g Oral BID  . mouth rinse  15 mL Mouth Rinse BID  . multivitamin with minerals  1 tablet Oral Daily  . pantoprazole sodium  40 mg Oral Daily   Continuous Infusions: . sodium chloride Stopped (07/11/19 9562)  Aline August, MD [facility] 07/20/2019, 7:29 AM

## 2019-07-21 DIAGNOSIS — I5033 Acute on chronic diastolic (congestive) heart failure: Secondary | ICD-10-CM

## 2019-07-21 LAB — BASIC METABOLIC PANEL
Anion gap: 15 (ref 5–15)
BUN: 45 mg/dL — ABNORMAL HIGH (ref 8–23)
CO2: 27 mmol/L (ref 22–32)
Calcium: 8.5 mg/dL — ABNORMAL LOW (ref 8.9–10.3)
Chloride: 106 mmol/L (ref 98–111)
Creatinine, Ser: 2.07 mg/dL — ABNORMAL HIGH (ref 0.44–1.00)
GFR calc Af Amer: 29 mL/min — ABNORMAL LOW (ref >60)
GFR calc non Af Amer: 25 mL/min — ABNORMAL LOW (ref >60)
Glucose, Bld: 151 mg/dL — ABNORMAL HIGH (ref 70–99)
Potassium: 3.8 mmol/L (ref 3.5–5.1)
Sodium: 148 mmol/L — ABNORMAL HIGH (ref 135–145)

## 2019-07-21 LAB — CBC WITH DIFFERENTIAL/PLATELET
Abs Immature Granulocytes: 0.05 10*3/uL (ref 0.00–0.07)
Basophils Absolute: 0.1 10*3/uL (ref 0.0–0.1)
Basophils Relative: 1 %
Eosinophils Absolute: 0.4 10*3/uL (ref 0.0–0.5)
Eosinophils Relative: 5 %
HCT: 28.4 % — ABNORMAL LOW (ref 36.0–46.0)
Hemoglobin: 7.9 g/dL — ABNORMAL LOW (ref 12.0–15.0)
Immature Granulocytes: 1 %
Lymphocytes Relative: 9 %
Lymphs Abs: 0.7 10*3/uL (ref 0.7–4.0)
MCH: 27.7 pg (ref 26.0–34.0)
MCHC: 27.8 g/dL — ABNORMAL LOW (ref 30.0–36.0)
MCV: 99.6 fL (ref 80.0–100.0)
Monocytes Absolute: 0.6 10*3/uL (ref 0.1–1.0)
Monocytes Relative: 7 %
Neutro Abs: 6.3 10*3/uL (ref 1.7–7.7)
Neutrophils Relative %: 77 %
Platelets: 180 10*3/uL (ref 150–400)
RBC: 2.85 MIL/uL — ABNORMAL LOW (ref 3.87–5.11)
RDW: 23.7 % — ABNORMAL HIGH (ref 11.5–15.5)
WBC: 8.1 10*3/uL (ref 4.0–10.5)
nRBC: 0.2 % (ref 0.0–0.2)

## 2019-07-21 LAB — GLUCOSE, CAPILLARY
Glucose-Capillary: 148 mg/dL — ABNORMAL HIGH (ref 70–99)
Glucose-Capillary: 189 mg/dL — ABNORMAL HIGH (ref 70–99)
Glucose-Capillary: 159 mg/dL — ABNORMAL HIGH (ref 70–99)
Glucose-Capillary: 159 mg/dL — ABNORMAL HIGH (ref 70–99)

## 2019-07-21 LAB — MAGNESIUM: Magnesium: 1.7 mg/dL (ref 1.7–2.4)

## 2019-07-21 NOTE — TOC Progression Note (Addendum)
Transition of Care [facility]) - Progression Note    Patient Details  Name: [name] MRN: [id number] Date of Birth: June 26, 1955  Transition of Care [facility]) CM/SW [place], [place] Phone Number: [phone] 07/21/2019, 1:05 PM  Clinical Narrative:     CSW attempted to reach out to spouse and cell went straight to voice mail and could not leave a message.   CSW reached spouse via home phone and explained that patient had a bed offer at [facility]. Spouse inquired about location and CSW informed him that it was in [place]. Spouse became upset stating that he wants patient to be close to [place]. CSW informed him that the facilities in [place] had declined. Spouse stated that he has trouble getting to [facility] and we want to send patient to [facility]. CSW attempted to explain the SNF process further and spouse hung up on CSW.  TOC team will continue to monitor for discharge planning needs.   Expected Discharge Plan: Long Term Nursing Home Barriers to Discharge: Continued Medical Work up  Expected Discharge Plan and Services Expected Discharge Plan: [place]       Living arrangements for the past 2 months: [place] Expected Discharge Date:  (unknown)                                     Social Determinants of Health (SDOH) Interventions    Readmission Risk Interventions Readmission Risk Prevention Plan 05/28/2019 02/26/2019 12/17/2018  Transportation Screening Complete Complete Complete  PCP or Specialist Appt within 5-7 Days - - -  Home Care Screening - - -  Medication Review (RN CM) - - -  HRI or [place] Work Consult for [place] Planning/Counseling - - -  SW consult not completed comments - - -  Palliative Care Screening - - -  Medication Review Press photographer) Complete Complete Referral to Pharmacy  PCP or Specialist appointment within 3-5 days of discharge - (No Data) Not Complete   PCP/Specialist Appt Not Complete comments - - DC date unknown but pt is established with providers  [place] or [place] Complete Complete Not Complete  HRI or Home Care Consult Pt Refusal Comments - - SNF resident  SW Recovery Care/Counseling Consult Complete - Complete  Palliative Care Screening Not Applicable Not Applicable Not Complete  Comments - - pending need  [place] Complete Patient Refused Complete  Some recent data might be hidden

## 2019-07-21 NOTE — Progress Notes (Signed)
Patient ID: [name], female   DOB: [birth year], 64 y.o.   MRN: [id number]  PROGRESS NOTE    SHAMINA ETHERIDGE  HYW:[id number] DOB: [birth year] DOA: 06/27/2019 PCP: [facility]   Brief Narrative:  64 year old chronically ill female resident of [facility] SN[facility] with multiple medical problems including CAD, COPD, CKD stage IIIb, chronic diastolic CHF, hep C, cirrhosis was admitted with sepsis, encephalopathy and extensive full-thickness tissue loss of right foot. MRI indicated right foot abscess, myositis and tenosynovitis as well as left foot myositis and cellulitis. 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  She was unable to be extubated postop and was admitted to the ICU on 07/04/19.  [facility] course complicated by encephalopathy, has been treated for hepatic encephalopathy with lactulose -Also noted to have dysphagia requiring NG tube, tube feeds -Prognosis felt to be very poor, palliative medicine team was consulted  -Also treated for postop blood loss anemia and hemorrhagic shock -She was transferred from [facility] to [facility] service  4/85/46 -Complicated by ongoing failure to thrive, poor p.o. intake, palliative care discussions, plan for possible SNF with palliative care when stable, high risk of complications and rehospitalizations. -Finally cortrak removed and started dysphagia diet - On 07/15/19, she became more lethargic, ammonia level went up to 57, lactulose dose increased again -Overnight of 07/16/2019, she had worsening dyspnea with chest x-ray notable for fluid overload.   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  Monitor mental status. -Fall precautions.  PT/OT recommends SNF placement -SLP following.  Tube feeds discontinued.  Started oral feeding but oral intake remains poor  Hemorrhagic shock Postop bleeding -Appears to have resolved.  Status post 7 units packed red cells transfusion during this hospitalization.  Hemoglobin 7.9 today.  Monitor H&H.  Transfuse if hemoglobin is less than 7.  Received IV iron on 07/14/2019  Acute on chronic hypoxic respiratory failure Acute on chronic diastolic heart failure COPD -Mechanical ventilation from 07/04/2019-07/09/2019 -Continue BiPAP nightly and daytime during naps -Diuretics were briefly held for few days giving worsening sodium went overnight on 07/16/2019, patient was more hypoxic with chest x-ray showing fluid overload and subsequently intravenous Lasix was restarted.  Strict input and output.  Daily weights.  Fluid restriction. -Currently requiring 2 L oxygen via nasal cannula intermittently.  Chest x-ray from 07/18/2019 did show mild bibasilar atelectasis or edema with probable small pleural effusions.  Lasix dose was increased to 80 mg IV every 12 hours on 07/18/2019.  Lasix discontinued on 07/20/2019 because of sodium reaching 150.  Acute kidney injury on chronic kidney disease stage IIIb -Creatinine relatively stable now in the 2.5 range.  Creatinine 2.07 today.  Monitor  Hypomagnesemia -Improved  Hepatic encephalopathy Hepatitis C Liver cirrhosis -Continue lactulose.  Overall poor prognosis.  Diabetes mellitus type 2 -Continue CBGs with SSI.  Generalized deconditioning -Overall prognosis is very poor because of multiple serious medical problems including vascular disease/CAD//COPD/OSA/chronic renal disease stage IIIb/liver  cirrhosis/multiple frequent hospitalizations -Palliative care has evaluated the patient and has had family discussion during  the hospitalization.  Recommend outpatient palliative care follow-up. -If condition worsens, consider hospice/comfort measures -will need SNF placement.  Not a candidate for LTACH as per social worker   DVT prophylaxis: Lovenox Code Status: DNR Family Communication: None at bedside Disposition Plan: Status is: Inpatient  Remains inpatient appropriate because:Inpatient level of care appropriate due to severity of illness   Dispo: The patient is from: SNF              Anticipated d/c is to: SNF              Anticipated d/c date is: 1 day              Patient currently is medically stable to d/c.  Consultants: PCCM/vascular surgery/palliative  Procedures:  Right above-knee amputation on 07/04/2019  Antimicrobials:  Anti-infectives (From admission, onward)   Start     Dose/Rate Route Frequency Ordered Stop   07/02/19 1200  vancomycin (VANCOCIN) IVPB 1000 mg/200 mL premix        1,000 mg 200 mL/hr over 60 Minutes Intravenous Every 48 hours 07/01/19 1228 07/06/19 2359   06/28/19 1000  vancomycin (VANCOREADY) IVPB 1250 mg/250 mL  Status:  Discontinued        1,250 mg 166.7 mL/hr over 90 Minutes Intravenous Every 24 hours 06/27/19 1223 07/01/19 1213   06/27/19 1300  ceFEPIme (MAXIPIME) 2 g in sodium chloride 0.9 % 100 mL IVPB        2 g 200 mL/hr over 30 Minutes Intravenous Every 12 hours 06/27/19 1223 07/07/19 0021   06/27/19 1230  vancomycin (VANCOREADY) IVPB 2000 mg/400 mL        2,000 mg 200 mL/hr over 120 Minutes Intravenous  Once 06/27/19 1223 06/27/19 1743       Subjective: Patient seen and examined at bedside.  Extremely poor historian.  Awake, answers only few questions.  Slightly confused.  No overnight fever, vomiting, chest pain reported.   Objective: Vitals:   07/20/19 0302 07/20/19 1200 07/20/19 1936 07/21/19 0446  BP: (!) 153/82 139/81 (!)  149/71 (!) 165/82  Pulse: 84 81 90 92  Resp: 19 18 18 20   Temp: 97.8 F (36.6 C) 98.3 F (36.8 C) 98 F (36.7 C) (!) 97.4 F (36.3 C)  TempSrc: Oral Oral Oral Oral  SpO2: 94% 100% 93% 91%  Weight: 80 kg     Height:        Intake/Output Summary (Last 24 hours) at 07/21/2019 0759 Last data filed at 07/21/2019 0003 Gross per 24 hour  Intake 820 ml  Output 400 ml  Net 420 ml   Filed Weights   07/18/19 0459 07/19/19 0351 07/20/19 0302  Weight: 86 kg 85 kg 80 kg    Examination:  General exam: No distress.  Looks chronically ill and very deconditioned. Respiratory system: Bilateral decreased breath sounds at bases with scattered crackles cardiovascular system: S1-S2 heard, rate controlled gastrointestinal system: Abdomen is slightly distended, soft and nontender.  Bowel sounds heard  extremities: Mild left lower extremity edema present right AKA present.  No cyanosis.  Ischemic changes in left foot with amputation of left great toe Central nervous system: Extremely slow to respond, very poor historian; slightly confused.  No focal neurological deficits. Moving extremities Skin: No obvious lesions/petechiae Psychiatry: Cannot be assessed because of mental status    Data Reviewed: I have personally reviewed following labs and imaging studies  CBC: Recent Labs  Lab 07/17/19 0501 07/18/19 0118 07/19/19 0352 07/20/19 0519 07/21/19 0339  WBC  9.4 9.1 8.9 7.2 8.1  NEUTROABS  --  7.2 7.1 5.5 6.3  HGB 7.6* 7.5* 7.6* 8.2* 7.9*  HCT 26.7* 26.1* 27.6* 29.1* 28.4*  MCV 97.1 97.8 99.6 101.0* 99.6  PLT 160 158 181 180 158   Basic Metabolic Panel: Recent Labs  Lab 07/17/19 0501 07/18/19 0118 07/19/19 0352 07/20/19 0519 07/21/19 0339  NA 144 146* 147* 150* 148*  K 4.2 4.1 3.9 3.9 3.8  CL 111 111 108 108 106  CO2 23 26 28  32 27  GLUCOSE 148* 143* 171* 112* 151*  BUN 47* 50* 50* 45* 45*  CREATININE 2.32* 2.33* 2.23* 2.21* 2.07*  CALCIUM 8.8* 8.7* 8.8* 8.7* 8.5*  MG  --  1.6*  1.8 1.7 1.7   GFR: Estimated Creatinine Clearance: 28.7 mL/min (A) (by C-G formula based on SCr of 2.07 mg/dL (H)). Liver Function Tests: Recent Labs  Lab 07/17/19 0501 07/18/19 0118 07/19/19 0352  AST 15 19 15   ALT 12 12 11   ALKPHOS 120 111 104  BILITOT 1.4* 1.4* 1.0  PROT 8.6* 8.5* 8.8*  ALBUMIN 2.2* 2.2* 2.2*   No results for input(s): LIPASE, AMYLASE in the last 168 hours. Recent Labs  Lab 07/15/19 1022 07/18/19 0118  AMMONIA 57* 52*   Coagulation Profile: No results for input(s): INR, PROTIME in the last 168 hours. Cardiac Enzymes: No results for input(s): CKTOTAL, CKMB, CKMBINDEX, TROPONINI in the last 168 hours. BNP (last 3 results) No results for input(s): PROBNP in the last 8760 hours. HbA1C: No results for input(s): HGBA1C in the last 72 hours. CBG: Recent Labs  Lab 07/20/19 0603 07/20/19 1203 07/20/19 1624 07/20/19 2105 07/21/19 0608  GLUCAP 107* 106* 134* 174* 148*   Lipid Profile: No results for input(s): CHOL, HDL, LDLCALC, TRIG, CHOLHDL, LDLDIRECT in the last 72 hours. Thyroid Function Tests: No results for input(s): TSH, T4TOTAL, FREET4, T3FREE, THYROIDAB in the last 72 hours. Anemia Panel: No results for input(s): VITAMINB12, FOLATE, FERRITIN, TIBC, IRON, RETICCTPCT in the last 72 hours. Sepsis Labs: No results for input(s): PROCALCITON, LATICACIDVEN in the last 168 hours.  No results found for this or any previous visit (from the past 240 hour(s)).       Radiology Studies: No results found.      Scheduled Meds:  sodium chloride   Intravenous Once   atorvastatin  40 mg Oral Daily   feeding supplement (PRO-STAT SUGAR FREE 64)  30 mL Oral TID   folic acid  1 mg Oral Daily   hydrocortisone cream   Topical BID   insulin aspart  0-15 Units Subcutaneous TID WC   lactulose  20 g Oral BID   mouth rinse  15 mL Mouth Rinse BID   multivitamin with minerals  1 tablet Oral Daily   pantoprazole sodium  40 mg Oral Daily    Continuous Infusions:  sodium chloride Stopped (07/11/19 3094)          Aline August, MD [facility] 07/21/2019, 7:59 AM

## 2019-07-22 LAB — CBC WITH DIFFERENTIAL/PLATELET
Abs Immature Granulocytes: 0.04 10*3/uL (ref 0.00–0.07)
Basophils Absolute: 0.1 10*3/uL (ref 0.0–0.1)
Basophils Relative: 1 %
Eosinophils Absolute: 0.4 10*3/uL (ref 0.0–0.5)
Eosinophils Relative: 5 %
HCT: 28.6 % — ABNORMAL LOW (ref 36.0–46.0)
Hemoglobin: 7.9 g/dL — ABNORMAL LOW (ref 12.0–15.0)
Immature Granulocytes: 1 %
Lymphocytes Relative: 11 %
Lymphs Abs: 0.8 10*3/uL (ref 0.7–4.0)
MCH: 28.2 pg (ref 26.0–34.0)
MCHC: 27.6 g/dL — ABNORMAL LOW (ref 30.0–36.0)
MCV: 102.1 fL — ABNORMAL HIGH (ref 80.0–100.0)
Monocytes Absolute: 0.7 10*3/uL (ref 0.1–1.0)
Monocytes Relative: 9 %
Neutro Abs: 5.6 10*3/uL (ref 1.7–7.7)
Neutrophils Relative %: 73 %
Platelets: 168 10*3/uL (ref 150–400)
RBC: 2.8 MIL/uL — ABNORMAL LOW (ref 3.87–5.11)
RDW: 23.8 % — ABNORMAL HIGH (ref 11.5–15.5)
WBC: 7.6 10*3/uL (ref 4.0–10.5)
nRBC: 0 % (ref 0.0–0.2)

## 2019-07-22 LAB — BASIC METABOLIC PANEL
Anion gap: 10 (ref 5–15)
BUN: 44 mg/dL — ABNORMAL HIGH (ref 8–23)
CO2: 28 mmol/L (ref 22–32)
Calcium: 8 mg/dL — ABNORMAL LOW (ref 8.9–10.3)
Chloride: 109 mmol/L (ref 98–111)
Creatinine, Ser: 1.92 mg/dL — ABNORMAL HIGH (ref 0.44–1.00)
GFR calc Af Amer: 31 mL/min — ABNORMAL LOW (ref >60)
GFR calc non Af Amer: 27 mL/min — ABNORMAL LOW (ref >60)
Glucose, Bld: 152 mg/dL — ABNORMAL HIGH (ref 70–99)
Potassium: 3.7 mmol/L (ref 3.5–5.1)
Sodium: 147 mmol/L — ABNORMAL HIGH (ref 135–145)

## 2019-07-22 LAB — GLUCOSE, CAPILLARY
Glucose-Capillary: 128 mg/dL — ABNORMAL HIGH (ref 70–99)
Glucose-Capillary: 148 mg/dL — ABNORMAL HIGH (ref 70–99)
Glucose-Capillary: 217 mg/dL — ABNORMAL HIGH (ref 70–99)
Glucose-Capillary: 187 mg/dL — ABNORMAL HIGH (ref 70–99)

## 2019-07-22 LAB — MAGNESIUM: Magnesium: 1.6 mg/dL — ABNORMAL LOW (ref 1.7–2.4)

## 2019-07-22 MED ORDER — MAGNESIUM SULFATE 2 GM/50ML IV SOLN
2.0000 g | Freq: Once | INTRAVENOUS | Status: AC
  Administered 2019-07-22: 2 g via INTRAVENOUS
  Filled 2019-07-22: qty 50

## 2019-07-22 NOTE — Progress Notes (Signed)
Physical Therapy Treatment Patient Details Name: [name] MRN: [id number] DOB: 10/23/1955 Today's Date: 07/22/2019    History of Present Illness [name] is a 64yo f with a PMHx of diastolic CHF with anasarca, NSTEMI, COPD, DMII uncontrolled, anemia, CKD IIIB, hepatitis C with cirrhosis who presented for altered mental status. 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 She was unable to be extubated following surgery. CCM was consulted for ventilator management.  Extubated 6/22.    PT Comments    Patient alert and awake during today's session. A few verbalizations noted early on but then preferred nodding to answer questions inconsistently. Worked on sitting balance EOB without support and performing there ex of neck and right residual limb. Attempted scooting along side bed with Max A of 2 however pt with difficulty motor planning and leaning backwards. Appeared fearful of falling off bed. Nurse requested to not transfer pt to chair using lift today for fear of pt falling out of chair. Will continue to follow and progress as tolerated. POC and goals updated.      Follow Up Recommendations  SNF;LTACH     Equipment Recommendations  Other (comment) (defer)    Recommendations for Other Services       Precautions / Restrictions Precautions Precautions: Fall Precaution Comments: right AKA Restrictions Weight Bearing Restrictions: No    Mobility  Bed Mobility Overal bed mobility: Needs Assistance Bed Mobility: Rolling;Sidelying to Sit;Sit to Sidelying Rolling: Mod assist Sidelying to sit: Mod assist;+2 for physical assistance     Sit to sidelying: Mod assist;+2 for physical assistance General bed mobility comments: Rolling to right/left to adjust pads with mod A, difficulty with initiation. Perseverating on itching/scratching self, donned lotion to help with dry skin.  Transfers Overall  transfer level: Needs assistance   Transfers: Lateral/Scoot Transfers          Lateral/Scoot Transfers: Max assist;Total assist;+2 physical assistance General transfer comment: Nurse request to not have therapy transfer pt to chair today due to risk of falling. Attempted laterally scooting along side bed with Max A of 2 however pt not able to problem solve technique despite visual/verbal instructions, leaning posterioly and appears fearful of falling off bed.  Ambulation/Gait             General Gait Details: unable to attempt   Stairs             Wheelchair Mobility    Modified Rankin (Stroke Patients Only)       Balance Overall balance assessment: Needs assistance Sitting-balance support: Single extremity supported (foot supported) Sitting balance-Leahy Scale: Fair Sitting balance - Comments: Min guard for safety. Worked on cervical AROM and upright posture in sitting. Difficulty rotating neck to right.     Standing balance-Leahy Scale: Zero Standing balance comment: unable                            Cognition Arousal/Alertness: Awake/alert Behavior During Therapy: Flat affect Overall Cognitive Status: No family/caregiver present to determine baseline cognitive functioning Area of Impairment: Problem solving;Following commands;Memory;Attention                   Current Attention Level: Sustained Memory: Decreased short-term memory Following Commands: Follows one step commands inconsistently;Follows one step commands with increased time     Problem Solving: Slow processing;Requires verbal cues;Decreased initiation General Comments: Says "good morning" upon arrival. "  how are you?" Flat affect throughout. Nods at times to answer questions but not consistently. Nods yes to bring in the hospital.      Exercises Amputee Exercises Hip Extension: AROM;10 reps;Supine Straight Leg Raises: AROM;Right;10 reps;Supine    General Comments         Pertinent Vitals/Pain Pain Assessment: Faces Faces Pain Scale: No hurt    Home Living                      Prior Function            PT Goals (current goals can now be found in the care plan section) Acute Rehab PT Goals Patient Stated Goal: pt not able to state PT Goal Formulation: Patient unable to participate in goal setting Time For Goal Achievement: 08/05/19 Potential to Achieve Goals: Fair Progress towards PT goals: Progressing toward goals;Goals met and updated - see care plan    Frequency    Min 2X/week      PT Plan Current plan remains appropriate    Co-evaluation              AM-PAC PT "6 Clicks" Mobility   Outcome Measure  Help needed turning from your back to your side while in a flat bed without using bedrails?: A Lot Help needed moving from lying on your back to sitting on the side of a flat bed without using bedrails?: A Lot Help needed moving to and from a bed to a chair (including a wheelchair)?: Total Help needed standing up from a chair using your arms (e.g., wheelchair or bedside chair)?: Total Help needed to walk in hospital room?: Total Help needed climbing 3-5 steps with a railing? : Total 6 Click Score: 8    End of Session   Activity Tolerance: Patient tolerated treatment well Patient left: in bed;with call bell/phone within reach;with bed alarm set;Other (comment) (pillow under LLE to float heel due to wound) Nurse Communication: Mobility status;Need for lift equipment PT Visit Diagnosis: Other abnormalities of gait and mobility (R26.89);Muscle weakness (generalized) (M62.81)     Time: 1000-1021 PT Time Calculation (min) (ACUTE ONLY): 21 min  Charges:  $Therapeutic Activity: 8-22 mins                     Marisa Severin, PT, DPT Acute Rehabilitation Services Pager [phone] Office [phone]       Marguarite Arbour A Sabra Heck 07/22/2019, 12:58 PM

## 2019-07-22 NOTE — Progress Notes (Signed)
Patient ID: [name], female   DOB: [birth year], 64 y.o.   MRN: [id number]  PROGRESS NOTE    [name]  MGQ:[id number] DOB: [birth year] DOA: 06/27/2019 PCP: [facility]   Brief Narrative:  64 year old chronically ill female resident of [facility] SNF with multiple medical problems including CAD, COPD, CKD stage IIIb, chronic diastolic CHF, hep C, cirrhosis was admitted with sepsis, encephalopathy and extensive full-thickness tissue loss of right foot. MRI indicated right foot abscess, myositis and tenosynovitis as well as left foot myositis and cellulitis. 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  She was unable to be extubated postop and was admitted to the ICU on 07/04/19.  [facility] course complicated by encephalopathy, has been treated for hepatic encephalopathy with lactulose -Also noted to have dysphagia requiring NG tube, tube feeds -Prognosis felt to be very poor, palliative medicine team was consulted  -Also treated for postop blood loss anemia and hemorrhagic shock -She was transferred from [facility] to [facility] service  2/67/12 -Complicated by ongoing failure to thrive, poor p.o. intake, palliative care discussions, plan for possible SNF with palliative care when stable, high risk of complications and rehospitalizations. -Finally cortrak removed and started dysphagia diet - On 07/15/19, she became more lethargic, ammonia level went up to 57, lactulose dose increased again -Overnight of 07/16/2019, she had worsening dyspnea with chest x-ray notable for fluid overload.   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  Monitor mental status. -Fall precautions.  PT/OT recommends SNF placement -SLP following.  Tube feeds discontinued.  Started oral feeding but oral intake remains poor  Hemorrhagic shock Postop bleeding -Appears to have resolved.  Status post 7 units packed red cells transfusion during this hospitalization.  Hemoglobin 7.9 today.  Monitor H&H.  Transfuse if hemoglobin is less than 7.  Received IV iron on 07/14/2019  Acute on chronic hypoxic respiratory failure Acute on chronic diastolic heart failure COPD -Mechanical ventilation from 07/04/2019-07/09/2019 -Continue BiPAP nightly and daytime during naps -Diuretics were briefly held for few days giving worsening sodium went overnight on 07/16/2019, patient was more hypoxic with chest x-ray showing fluid overload and subsequently intravenous Lasix was restarted.  Strict input and output.  Daily weights.  Fluid restriction. -Currently requiring 2 L oxygen via nasal cannula intermittently.  Chest x-ray from 07/18/2019 did show mild bibasilar atelectasis or edema with probable small pleural effusions.  Lasix dose was increased to 80 mg IV every 12 hours on 07/18/2019.  Lasix discontinued on 07/20/2019 because of sodium reaching 150.  Acute kidney injury on chronic kidney disease stage IIIb -Creatinine relatively stable now in the 2.5 range.  Creatinine 1.92 today.  Monitor  Hypomagnesemia -Replace.  Repeat a.m. labs  Hepatic encephalopathy Hepatitis C Liver cirrhosis -Continue lactulose.  Overall poor prognosis.  Diabetes mellitus type 2 -Continue CBGs with SSI.  Generalized deconditioning -Overall prognosis is very poor because of multiple serious medical problems including vascular disease/CAD//COPD/OSA/chronic renal disease  stage IIIb/liver cirrhosis/multiple frequent hospitalizations -Palliative care has evaluated the patient and has  had family discussion during the hospitalization.  Recommend outpatient palliative care follow-up. -If condition worsens, consider hospice/comfort measures -will need SNF placement.  Not a candidate for LTACH as per social worker   DVT prophylaxis: Lovenox Code Status: DNR Family Communication: None at bedside Disposition Plan: Status is: Inpatient  Remains inpatient appropriate because:Inpatient level of care appropriate due to severity of illness   Dispo: The patient is from: SNF              Anticipated d/c is to: SNF              Anticipated d/c date is: 1 day              Patient currently is medically stable to d/c.  Consultants: PCCM/vascular surgery/palliative  Procedures:  Right above-knee amputation on 07/04/2019  Antimicrobials:  Anti-infectives (From admission, onward)   Start     Dose/Rate Route Frequency Ordered Stop   07/02/19 1200  vancomycin (VANCOCIN) IVPB 1000 mg/200 mL premix        1,000 mg 200 mL/hr over 60 Minutes Intravenous Every 48 hours 07/01/19 1228 07/06/19 2359   06/28/19 1000  vancomycin (VANCOREADY) IVPB 1250 mg/250 mL  Status:  Discontinued        1,250 mg 166.7 mL/hr over 90 Minutes Intravenous Every 24 hours 06/27/19 1223 07/01/19 1213   06/27/19 1300  ceFEPIme (MAXIPIME) 2 g in sodium chloride 0.9 % 100 mL IVPB        2 g 200 mL/hr over 30 Minutes Intravenous Every 12 hours 06/27/19 1223 07/07/19 0021   06/27/19 1230  vancomycin (VANCOREADY) IVPB 2000 mg/400 mL        2,000 mg 200 mL/hr over 120 Minutes Intravenous  Once 06/27/19 1223 06/27/19 1743       Subjective: Patient seen and examined at bedside.  She is a poor historian.  Still slightly confused.  Awake, responds to some simple questions but does not engage in conversation.  Vomiting, fever reported by nursing staff.    Objective: Vitals:   07/21/19 1214 07/21/19  1954 07/22/19 0333 07/22/19 0523  BP: (!) 175/87 (!) 161/82 (!) 162/81   Pulse: 86 86 81   Resp: 18 20 20    Temp: 98 F (36.7 C) 98.2 F (36.8 C) (!) 97.5 F (36.4 C)   TempSrc: Oral Oral Oral   SpO2: 98% 100% 100%   Weight:    83 kg  Height:        Intake/Output Summary (Last 24 hours) at 07/22/2019 0749 Last data filed at 07/22/2019 0525 Gross per 24 hour  Intake 860 ml  Output 650 ml  Net 210 ml   Filed Weights   07/19/19 0351 07/20/19 0302 07/22/19 0523  Weight: 85 kg 80 kg 83 kg    Examination:  General exam: No acute distress.  Looks chronically ill and very deconditioned. Respiratory system: Bilateral decreased breath sounds at bases with some crackles.  No wheezing  cardiovascular system: Rate controlled, S1-S2 heard gastrointestinal system: Abdomen is obese, slightly distended, soft and nontender.  Normal bowel sounds heard extremities: Mild left lower extremity edema present right AKA present.  No clubbing.  Ischemic changes in left foot with amputation of left great toe Central nervous system: Still very slow to respond; very poor historian; slightly confused.  No focal neurological deficits. Moving extremities Skin: No obvious ecchymosis/lesions  psychiatry: Could not be assessed because of mental status    Data Reviewed: I have personally reviewed following labs and  imaging studies  CBC: Recent Labs  Lab 07/18/19 0118 07/19/19 0352 07/20/19 0519 07/21/19 0339 07/22/19 0217  WBC 9.1 8.9 7.2 8.1 7.6  NEUTROABS 7.2 7.1 5.5 6.3 5.6  HGB 7.5* 7.6* 8.2* 7.9* 7.9*  HCT 26.1* 27.6* 29.1* 28.4* 28.6*  MCV 97.8 99.6 101.0* 99.6 102.1*  PLT 158 181 180 180 846   Basic Metabolic Panel: Recent Labs  Lab 07/18/19 0118 07/19/19 0352 07/20/19 0519 07/21/19 0339 07/22/19 0217  NA 146* 147* 150* 148* 147*  K 4.1 3.9 3.9 3.8 3.7  CL 111 108 108 106 109  CO2 26 28 32 27 28  GLUCOSE 143* 171* 112* 151* 152*  BUN 50* 50* 45* 45* 44*  CREATININE 2.33* 2.23*  2.21* 2.07* 1.92*  CALCIUM 8.7* 8.8* 8.7* 8.5* 8.0*  MG 1.6* 1.8 1.7 1.7 1.6*   GFR: Estimated Creatinine Clearance: 31.5 mL/min (A) (by C-G formula based on SCr of 1.92 mg/dL (H)). Liver Function Tests: Recent Labs  Lab 07/17/19 0501 07/18/19 0118 07/19/19 0352  AST 15 19 15   ALT 12 12 11   ALKPHOS 120 111 104  BILITOT 1.4* 1.4* 1.0  PROT 8.6* 8.5* 8.8*  ALBUMIN 2.2* 2.2* 2.2*   No results for input(s): LIPASE, AMYLASE in the last 168 hours. Recent Labs  Lab 07/15/19 1022 07/18/19 0118  AMMONIA 57* 52*   Coagulation Profile: No results for input(s): INR, PROTIME in the last 168 hours. Cardiac Enzymes: No results for input(s): CKTOTAL, CKMB, CKMBINDEX, TROPONINI in the last 168 hours. BNP (last 3 results) No results for input(s): PROBNP in the last 8760 hours. HbA1C: No results for input(s): HGBA1C in the last 72 hours. CBG: Recent Labs  Lab 07/21/19 0608 07/21/19 1212 07/21/19 1652 07/21/19 2116 07/22/19 0613  GLUCAP 148* 189* 159* 159* 128*   Lipid Profile: No results for input(s): CHOL, HDL, LDLCALC, TRIG, CHOLHDL, LDLDIRECT in the last 72 hours. Thyroid Function Tests: No results for input(s): TSH, T4TOTAL, FREET4, T3FREE, THYROIDAB in the last 72 hours. Anemia Panel: No results for input(s): VITAMINB12, FOLATE, FERRITIN, TIBC, IRON, RETICCTPCT in the last 72 hours. Sepsis Labs: No results for input(s): PROCALCITON, LATICACIDVEN in the last 168 hours.  No results found for this or any previous visit (from the past 240 hour(s)).       Radiology Studies: No results found.      Scheduled Meds:  sodium chloride   Intravenous Once   atorvastatin  40 mg Oral Daily   feeding supplement (PRO-STAT SUGAR FREE 64)  30 mL Oral TID   folic acid  1 mg Oral Daily   hydrocortisone cream   Topical BID   insulin aspart  0-15 Units Subcutaneous TID WC   lactulose  20 g Oral BID   mouth rinse  15 mL Mouth Rinse BID   multivitamin with minerals  1  tablet Oral Daily   pantoprazole sodium  40 mg Oral Daily   Continuous Infusions:  sodium chloride Stopped (07/11/19 9629)          Aline August, MD [facility] 07/22/2019, 7:49 AM

## 2019-07-22 NOTE — Plan of Care (Signed)
  Problem: Clinical Measurements: Goal: Diagnostic test results will improve Outcome: Progressing  Mag replacement today Problem: Activity: Goal: Risk for activity intolerance will decrease Outcome: Progressing Unstafe to get into chair. Problem: Nutrition: Goal: Adequate nutrition will be maintained Outcome: Progressing Pt feeder and eating 25-50 on meals.

## 2019-07-22 NOTE — Progress Notes (Signed)
Patient is alert to voice, following simple commands. Pt spoke with her sister Raiford Noble, who lives in [place] on the phone. Observed patient talking answering yes, no, and ok.  Sister is concerned with [place] placement and requested to be closer to [employer] area.

## 2019-07-23 LAB — BASIC METABOLIC PANEL
Anion gap: 10 (ref 5–15)
BUN: 46 mg/dL — ABNORMAL HIGH (ref 8–23)
CO2: 31 mmol/L (ref 22–32)
Calcium: 8.1 mg/dL — ABNORMAL LOW (ref 8.9–10.3)
Chloride: 105 mmol/L (ref 98–111)
Creatinine, Ser: 1.91 mg/dL — ABNORMAL HIGH (ref 0.44–1.00)
GFR calc Af Amer: 32 mL/min — ABNORMAL LOW (ref >60)
GFR calc non Af Amer: 27 mL/min — ABNORMAL LOW (ref >60)
Glucose, Bld: 176 mg/dL — ABNORMAL HIGH (ref 70–99)
Potassium: 3.9 mmol/L (ref 3.5–5.1)
Sodium: 146 mmol/L — ABNORMAL HIGH (ref 135–145)

## 2019-07-23 LAB — SARS CORONAVIRUS 2 BY RT PCR (HOSPITAL ORDER, PERFORMED IN ~~LOC~~ HOSPITAL LAB): SARS Coronavirus 2: NEGATIVE

## 2019-07-23 LAB — CBC WITH DIFFERENTIAL/PLATELET
Abs Immature Granulocytes: 0.05 10*3/uL (ref 0.00–0.07)
Basophils Absolute: 0.1 10*3/uL (ref 0.0–0.1)
Basophils Relative: 1 %
Eosinophils Absolute: 0.4 10*3/uL (ref 0.0–0.5)
Eosinophils Relative: 5 %
HCT: 29.1 % — ABNORMAL LOW (ref 36.0–46.0)
Hemoglobin: 7.9 g/dL — ABNORMAL LOW (ref 12.0–15.0)
Immature Granulocytes: 1 %
Lymphocytes Relative: 8 %
Lymphs Abs: 0.6 10*3/uL — ABNORMAL LOW (ref 0.7–4.0)
MCH: 28.5 pg (ref 26.0–34.0)
MCHC: 27.1 g/dL — ABNORMAL LOW (ref 30.0–36.0)
MCV: 105.1 fL — ABNORMAL HIGH (ref 80.0–100.0)
Monocytes Absolute: 0.5 10*3/uL (ref 0.1–1.0)
Monocytes Relative: 7 %
Neutro Abs: 6 10*3/uL (ref 1.7–7.7)
Neutrophils Relative %: 78 %
Platelets: 161 10*3/uL (ref 150–400)
RBC: 2.77 MIL/uL — ABNORMAL LOW (ref 3.87–5.11)
RDW: 23 % — ABNORMAL HIGH (ref 11.5–15.5)
WBC: 7.6 10*3/uL (ref 4.0–10.5)
nRBC: 0 % (ref 0.0–0.2)

## 2019-07-23 LAB — GLUCOSE, CAPILLARY
Glucose-Capillary: 157 mg/dL — ABNORMAL HIGH (ref 70–99)
Glucose-Capillary: 143 mg/dL — ABNORMAL HIGH (ref 70–99)
Glucose-Capillary: 181 mg/dL — ABNORMAL HIGH (ref 70–99)
Glucose-Capillary: 116 mg/dL — ABNORMAL HIGH (ref 70–99)
Glucose-Capillary: 150 mg/dL — ABNORMAL HIGH (ref 70–99)

## 2019-07-23 LAB — MAGNESIUM: Magnesium: 2 mg/dL (ref 1.7–2.4)

## 2019-07-23 LAB — AMMONIA: Ammonia: 34 umol/L (ref 9–35)

## 2019-07-23 MED ORDER — FUROSEMIDE 40 MG PO TABS
40.0000 mg | ORAL_TABLET | Freq: Every day | ORAL | Status: DC
  Administered 2019-07-23 – 2019-07-26 (×4): 40 mg via ORAL
  Filled 2019-07-23 (×4): qty 1

## 2019-07-23 NOTE — Progress Notes (Signed)
Nutrition Follow-up  DOCUMENTATION CODES:   Obesity unspecified  INTERVENTION:   -Continue 30 ml Prostat TID, each supplement provides 100 kcals and 15 grams protein -Continue MVI with minerals daily -Continue Magic cup TID with meals, each supplement provides 290 kcal and 9 grams of protein -Continue Hormel Shake TID with meals, each supplement provides 520 kcals and 22 grams protein -Continue feeding assistance with meals  NUTRITION DIAGNOSIS:   Increased nutrient needs related to wound healing as evidenced by estimated needs.  Ongoing  GOAL:   Patient will meet greater than or equal to 90% of their needs  Unmet  MONITOR:   PO intake, Supplement acceptance, Diet advancement, Labs, Weight trends, Skin, I & O's  REASON FOR ASSESSMENT:   Consult Enteral/tube feeding initiation and management  ASSESSMENT:   Patient with PMH significant for CHF, NSTEMI, COPD, DM, gastric ulcer, chronic hepatitis C, cirrhosis of liver, and CKD III. Presents this admission with acute exacerbation CHF and bilateral lower extremity wounds.  6/17 - s/p R AKA 6/18 - Cortrak placed, tip gastric 6/19 - pt vomited TF and bile, TF stopped 6/20 - TF restarted 6/22 - extubated 6/25- cortrak removed, TF d/c, s/p BSE- advanced to heart healthy/ carb modified diet  Reviewed I/O's: -70 ml x 24 hours and +1.8 L since 07/09/19  UOP: 600 ml x 24 hours  Spoke with pt at bedside, who responded "okay" to most questions that RD asked. She reports her appetite is improving and ate some breakfast this morning. Noted lunch tray just delivered to room and pt awaiting assistance with meal. Intake documented at 25-50% of meals. Pt is taking Prostat supplement.   Per chart review, pt has experienced a 10.3% wt loss over the past week, which is significant for time frame.   Per TOC notes, pt awaiting SNF placement.   Medications reviewed and include lactulose  Labs reviewed: Na: 146, CBGS: 143-217 (inpatient  orders for glycemic control are 01-5 units insulin aspart TID with meals).   Nutrition-Focused Physical Exam:   Most Recent Value  Orbital Region No depletion  Upper Arm Region No depletion  Thoracic and Lumbar Region No depletion  Buccal Region No depletion  Temple Region No depletion  Clavicle Bone Region Mild depletion  Clavicle and Acromion Bone Region No depletion  Scapular Bone Region No depletion  Dorsal Hand No depletion  Patellar Region No depletion  Anterior Thigh Region No depletion  Posterior Calf Region No depletion  Edema (RD Assessment) Mild  Hair Reviewed  Eyes Reviewed  Mouth Reviewed  Skin Reviewed  Nails Reviewed      Diet Order:   Diet Order            Diet heart healthy/carb modified Room service appropriate? Yes; Fluid consistency: Thin; Fluid restriction: 1500 mL Fluid  Diet effective now                 EDUCATION NEEDS:   Not appropriate for education at this time  Skin:  Skin Assessment: Skin Integrity Issues: Skin Integrity Issues:: Other (Comment), Incisions, Stage II DTI: L heel Stage II: right thigh, left thigh Unstageable: L thigh, R foot Incisions: right knee Other: wound to sacrum  Last BM:  07/23/19  Height:   Ht Readings from Last 1 Encounters:  06/28/19 5' 5"  (1.651 m)    Weight:   Wt Readings from Last 1 Encounters:  07/23/19 84 kg    Ideal Body Weight:  52.3 kg (adjusted for AKA)  BMI:  Body mass index is 30.82 kg/m.  Estimated Nutritional Needs:   Kcal:  1900-2100  Protein:  100-115 grams  Fluid:  >/= 1.9 L/day    Loistine Chance, RD, LDN, CDCES Registered Dietitian II Certified Diabetes Care and Education Specialist Please refer to [facility] for RD and/or RD on-call/weekend/after hours pager

## 2019-07-23 NOTE — Progress Notes (Signed)
Occupational Therapy Treatment Patient Details Name: [name] MRN: [id number] DOB: [birth year] Today's Date: 07/23/2019    History of present illness [name] is a 64yo f with a PMHx of diastolic CHF with anasarca, NSTEMI, COPD, DMII uncontrolled, anemia, CKD IIIB, hepatitis C with cirrhosis who presented for altered mental status. 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 She was unable to be extubated following surgery. CCM was consulted for ventilator management.  Extubated 6/22.   OT comments  Patient met lying supine in bed in agreement with EOB activity. +2 assist for rolling to R and for sidelying to EOB with use of chuck pad. Once seated EOB, patient able to maintain sitting balance with initial Min guard progressing to close supervision A without external assist even with AAROM to BUE. Patient with downward gaze and fluctuating alertness/arousal requiring repeat cueing. Patient continues to follow 1-step verbal commands intermittently requiring hand over hand assist for face washing seated EOB. Patient would continue to benefit from acute OT services in prep for d/c to next level of care with updated recommendation for SNF vs/ LTCH.    Follow Up Recommendations  SNF    Equipment Recommendations  Other (comment) (Defer to next level of care)    Recommendations for Other Services      Precautions / Restrictions Precautions Precautions: Fall Precaution Comments: right AKA Restrictions Weight Bearing Restrictions: No       Mobility Bed Mobility Overal bed mobility: Needs Assistance Bed Mobility: Rolling;Sidelying to Sit;Sit to Sidelying Rolling: Max assist Sidelying to sit: +2 for physical assistance;Total assist   Sit to supine: +2 for physical assistance;Total assist   General bed mobility comments: Rolling to R with use of chuck pad. No attempt to initiate with sidelying to sit.  Patient did initiate assisting trunk to bed level with return to supine.   Transfers                      Balance Overall balance assessment: Needs assistance Sitting-balance support: Bilateral upper extremity supported (Unilateral LE support ) Sitting balance-Leahy Scale: Fair Sitting balance - Comments: Patient able to maintain seated position with intial Min guard. Progressed to close supervision A w/ BUE supported on bed surface.      Standing balance-Leahy Scale: Zero Standing balance comment: unable                           ADL either performed or assessed with clinical judgement   ADL Overall ADL's : Needs assistance/impaired     Grooming: Wash/dry face;Maximal assistance;Bed level;Oral care;Moderate assistance Grooming Details (indicate cue type and reason): Max A to wash face seated EOB. Despite repeat cueing, patient unable to initiate task independently requiring hand over hand asssit.                                      Vision       Perception     Praxis      Cognition Arousal/Alertness: Lethargic Behavior During Therapy: Flat affect Overall Cognitive Status: No family/caregiver present to determine baseline cognitive functioning Area of Impairment: Problem solving;Following commands;Memory;Attention                 Orientation Level: Place;Time;Situation;Person Current Attention Level: Sustained Memory: Decreased short-term memory Following Commands:  Follows one step commands inconsistently;Follows one step commands with increased time Safety/Judgement: Decreased awareness of safety Awareness: Intellectual Problem Solving: Slow processing;Requires verbal cues;Decreased initiation General Comments: Pt. obtunded. Periods of more wakefulness after anterior scoot toward HOB and initially after supine to EOB transfer. Pt with continued downward gaze to R requiring cues to look upright.         Exercises Exercises:  General Upper Extremity General Exercises - Upper Extremity Shoulder Flexion: AAROM;10 reps;Seated Shoulder Extension: AAROM;10 reps;Seated Elbow Flexion: AAROM;10 reps;Seated Elbow Extension: AAROM;10 reps;Seated   Shoulder Instructions       General Comments      Pertinent Vitals/ Pain       Pain Assessment: Faces Faces Pain Scale: Hurts little more Pain Location: RLE residual limb Pain Descriptors / Indicators: Guarding;Grimacing Pain Intervention(s): Monitored during session  Home Living                                          Prior Functioning/Environment              Frequency  Min 2X/week        Progress Toward Goals  OT Goals(current goals can now be found in the care plan section)  Progress towards OT goals: Progressing toward goals  Acute Rehab OT Goals Patient Stated Goal: No goals stated OT Goal Formulation: With patient Time For Goal Achievement: 08/06/19 Potential to Achieve Goals: Good ADL Goals Pt Will Perform Eating: with set-up;sitting;bed level Pt Will Perform Grooming: with mod assist;bed level Pt/caregiver will Perform Home Exercise Program: Increased strength;Both right and left upper extremity Additional ADL Goal #1: Pt will consistently follow 1-2 step commands in order to increase carry over of skills from session to session. Additional ADL Goal #2: Pt. will tolerate EOB x15 minutes with Min guard for stability in prep for ADLs.  Plan Other (comment);Discharge plan needs to be updated (Goals and time for goal achievement to be updated)    Co-evaluation                 AM-PAC OT "6 Clicks" Daily Activity     Outcome Measure   Help from another person eating meals?: Total Help from another person taking care of personal grooming?: A Little Help from another person toileting, which includes using toliet, bedpan, or urinal?: Total Help from another person bathing (including washing, rinsing, drying)?: A  Lot Help from another person to put on and taking off regular upper body clothing?: A Lot Help from another person to put on and taking off regular lower body clothing?: Total 6 Click Score: 10    End of Session    OT Visit Diagnosis: Unsteadiness on feet (R26.81);Muscle weakness (generalized) (M62.81)   Activity Tolerance     Patient Left     Nurse Communication          Time: 9201-0071 OT Time Calculation (min): 18 min  Charges: OT General Charges $OT Visit: 1 Visit OT Treatments $Therapeutic Activity: 8-22 mins  Fermon Ureta H. OTR/L Supplemental OT, Department of rehab services [phone]   Deeann Saint R [place]-Davis 07/23/2019, 1:31 PM

## 2019-07-23 NOTE — Progress Notes (Signed)
   07/23/19 0358  Vitals  Temp (!) 97.5 F (36.4 C)  Temp Source Oral  BP (!) 150/75  MAP (mmHg) 96  BP Location Right Arm  BP Method Automatic  Patient Position (if appropriate) Lying  Pulse Rate 90  Resp 20  Oxygen Therapy  SpO2 100 %  O2 Device Nasal Cannula  O2 Flow Rate (L/min) 2 L/min  MEWS Score  MEWS Temp 0  MEWS Systolic 0  MEWS Pulse 0  MEWS RR 0  MEWS LOC 0  MEWS Score 0  MEWS Score Color Green   pt was found trying to get out of bed, pts torso was stuck between bedrails while head was resting on the ground, pt placed back in bed, VSS, full assessment completed at this time, see flowsheet, pt denies hitting her head, Dr. Myna Hidalgo paged and made aware of incident no new orders at this time, will CTM.

## 2019-07-23 NOTE — Plan of Care (Signed)
  Problem: Activity: Goal: Risk for activity intolerance will decrease 07/23/2019 1933 by Beckey Rutter, RN Outcome: Not Progressing 07/23/2019 1933 by Beckey Rutter, RN Outcome: Progressing   Problem: Elimination: Goal: Will not experience complications related to bowel motility 07/23/2019 1933 by Beckey Rutter, RN Outcome: Not Progressing 07/23/2019 1933 by Beckey Rutter, RN Outcome: Progressing Goal: Will not experience complications related to urinary retention 07/23/2019 1933 by Beckey Rutter, RN Outcome: Not Progressing 07/23/2019 1933 by Beckey Rutter, RN Outcome: Progressing

## 2019-07-23 NOTE — Progress Notes (Signed)
Patient ID: [name], female   DOB: 03/17/1955, 64 y.o.   MRN: [id number]  PROGRESS NOTE    [name]  GYF:[id number] DOB: [birth year] DOA: 06/27/2019 PCP: [place]   Brief Narrative:  64 year old chronically ill female resident of [facility] SNF with multiple medical problems including CAD, COPD, CKD stage IIIb, chronic diastolic CHF, hep C, cirrhosis was admitted with sepsis, encephalopathy and extensive full-thickness tissue loss of right foot. MRI indicated right foot abscess, myositis and tenosynovitis as well as left foot myositis and cellulitis. 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  She was unable to be extubated postop and was admitted to the ICU on 07/04/19.  [facility] course complicated by encephalopathy, has been treated for hepatic encephalopathy with lactulose -Also noted to have dysphagia requiring NG tube, tube feeds -Prognosis felt to be very poor, palliative medicine team was consulted  -Also treated for postop blood loss anemia and hemorrhagic shock -She was transferred from [facility] to [facility] service  10/03/63 -Complicated by ongoing failure to thrive, poor p.o. intake, palliative care discussions, plan for possible SNF with palliative care when stable, high risk of complications and rehospitalizations. -Finally cortrak removed and started dysphagia diet - On 07/15/19, she became more lethargic, ammonia level went up to 57, lactulose dose increased again -Overnight of 07/16/2019, she had worsening dyspnea with chest x-ray notable for fluid overload.   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  Monitor mental status. -Fall precautions.  PT/OT recommends SNF placement -SLP following.  Tube feeds discontinued.  Started oral feeding but oral intake remains poor  Hemorrhagic shock Postop bleeding -Appears to have resolved.  Status post 7 units packed red cells transfusion during this hospitalization.  Hemoglobin 7.9 today.  Monitor H&H.  Transfuse if hemoglobin is less than 7.  Received IV iron on 07/14/2019  Acute on chronic hypoxic respiratory failure Acute on chronic diastolic heart failure COPD -Mechanical ventilation from 07/04/2019-07/09/2019 -Continue BiPAP nightly and daytime during naps -Diuretics were briefly held for few days giving worsening sodium went overnight on 07/16/2019, patient was more hypoxic with chest x-ray showing fluid overload and subsequently intravenous Lasix was restarted.  Strict input and output.  Daily weights.  Fluid restriction. -Currently requiring 2 L oxygen via nasal cannula intermittently.  Chest x-ray from 07/18/2019 did show mild bibasilar atelectasis or edema with probable small pleural effusions.  Lasix dose was increased to 80 mg IV every 12 hours on 07/18/2019.  Lasix discontinued on 07/20/2019 because of sodium reaching 150. -will resume Lasix 40 mg daily today.  Acute kidney injury on chronic kidney disease stage IIIb -Creatinine relatively stable now in the 2.5 range.  Creatinine 1.91 today.  Monitor  Hypomagnesemia -Improved with replacement.  Hepatic encephalopathy Hepatitis C Liver cirrhosis -Continue lactulose; patient intermittently refuses lactulose.  Lasix plan as above.  Overall poor prognosis.  Diabetes mellitus type 2 -Continue CBGs with SSI.  Generalized deconditioning -Overall prognosis is very poor  because of multiple serious medical problems including vascular disease/CAD//COPD/OSA/chronic renal  disease stage IIIb/liver cirrhosis/multiple frequent hospitalizations -Palliative care has evaluated the patient and has had family discussion during the hospitalization.  Recommend outpatient palliative care follow-up. -If condition worsens, consider hospice/comfort measures -will need SNF placement.  Not a candidate for LTACH as per social worker   DVT prophylaxis: Lovenox Code Status: DNR Family Communication: None at bedside.  Tried calling husband on phone on 07/23/2019 but he did not pick up Disposition Plan: Status is: Inpatient  Remains inpatient appropriate because:Inpatient level of care appropriate due to severity of illness   Dispo: The patient is from: SNF              Anticipated d/c is to: SNF              Anticipated d/c date is: 1 day              Patient currently is medically stable to d/c.  Consultants: PCCM/vascular surgery/palliative  Procedures:  Right above-knee amputation on 07/04/2019  Antimicrobials:  Anti-infectives (From admission, onward)   Start     Dose/Rate Route Frequency Ordered Stop   07/02/19 1200  vancomycin (VANCOCIN) IVPB 1000 mg/200 mL premix        1,000 mg 200 mL/hr over 60 Minutes Intravenous Every 48 hours 07/01/19 1228 07/06/19 2359   06/28/19 1000  vancomycin (VANCOREADY) IVPB 1250 mg/250 mL  Status:  Discontinued        1,250 mg 166.7 mL/hr over 90 Minutes Intravenous Every 24 hours 06/27/19 1223 07/01/19 1213   06/27/19 1300  ceFEPIme (MAXIPIME) 2 g in sodium chloride 0.9 % 100 mL IVPB        2 g 200 mL/hr over 30 Minutes Intravenous Every 12 hours 06/27/19 1223 07/07/19 0021   06/27/19 1230  vancomycin (VANCOREADY) IVPB 2000 mg/400 mL        2,000 mg 200 mL/hr over 120 Minutes Intravenous  Once 06/27/19 1223 06/27/19 1743       Subjective: Patient seen and examined at bedside.  She is a poor historian.  Awake, hardly answers  any questions, still slightly confused.  No fever, vomiting reported by nursing staff. Objective: Vitals:   07/23/19 0200 07/23/19 0300 07/23/19 0356 07/23/19 0358  BP:    (!) 150/75  Pulse:    90  Resp: (!) 23 17  20   Temp:    (!) 97.5 F (36.4 C)  TempSrc:    Oral  SpO2:    100%  Weight:   84 kg   Height:        Intake/Output Summary (Last 24 hours) at 07/23/2019 0728 Last data filed at 07/22/2019 1805 Gross per 24 hour  Intake 290 ml  Output 300 ml  Net -10 ml   Filed Weights   07/20/19 0302 07/22/19 0523 07/23/19 0356  Weight: 80 kg 83 kg 84 kg    Examination:  General exam: No distress.  Looks chronically ill and very deconditioned. Respiratory system: Bilateral decreased breath sounds at bases with scattered crackles cardiovascular system: S1-S2 heard, rate controlled  gastrointestinal system: Abdomen is obese, slightly distended, soft and nontender.  Bowel sounds heard extremities: Trace left lower extremity edema present; right AKA present.  No cyanosis.  Ischemic changes in left foot with amputation of left great toe Central nervous system: Extremely slow to respond; very poor historian; still slightly confused.  No focal neurological deficits. Moving extremities Skin: No obvious lesions/ecchymosis psychiatry: Cannot assess because of poor mental status    Data Reviewed: I have personally  reviewed following labs and imaging studies  CBC: Recent Labs  Lab 07/19/19 0352 07/20/19 0519 07/21/19 0339 07/22/19 0217 07/23/19 0421  WBC 8.9 7.2 8.1 7.6 7.6  NEUTROABS 7.1 5.5 6.3 5.6 6.0  HGB 7.6* 8.2* 7.9* 7.9* 7.9*  HCT 27.6* 29.1* 28.4* 28.6* 29.1*  MCV 99.6 101.0* 99.6 102.1* 105.1*  PLT 181 180 180 168 657   Basic Metabolic Panel: Recent Labs  Lab 07/19/19 0352 07/20/19 0519 07/21/19 0339 07/22/19 0217 07/23/19 0421  NA 147* 150* 148* 147* 146*  K 3.9 3.9 3.8 3.7 3.9  CL 108 108 106 109 105  CO2 28 32 27 28 31   GLUCOSE 171* 112* 151* 152* 176*  BUN  50* 45* 45* 44* 46*  CREATININE 2.23* 2.21* 2.07* 1.92* 1.91*  CALCIUM 8.8* 8.7* 8.5* 8.0* 8.1*  MG 1.8 1.7 1.7 1.6* 2.0   GFR: Estimated Creatinine Clearance: 31.8 mL/min (A) (by C-G formula based on SCr of 1.91 mg/dL (H)). Liver Function Tests: Recent Labs  Lab 07/17/19 0501 07/18/19 0118 07/19/19 0352  AST 15 19 15   ALT 12 12 11   ALKPHOS 120 111 104  BILITOT 1.4* 1.4* 1.0  PROT 8.6* 8.5* 8.8*  ALBUMIN 2.2* 2.2* 2.2*   No results for input(s): LIPASE, AMYLASE in the last 168 hours. Recent Labs  Lab 07/18/19 0118 07/23/19 0421  AMMONIA 52* 34   Coagulation Profile: No results for input(s): INR, PROTIME in the last 168 hours. Cardiac Enzymes: No results for input(s): CKTOTAL, CKMB, CKMBINDEX, TROPONINI in the last 168 hours. BNP (last 3 results) No results for input(s): PROBNP in the last 8760 hours. HbA1C: No results for input(s): HGBA1C in the last 72 hours. CBG: Recent Labs  Lab 07/22/19 0613 07/22/19 1107 07/22/19 1624 07/22/19 2122 07/23/19 0616  GLUCAP 128* 148* 217* 187* 157*   Lipid Profile: No results for input(s): CHOL, HDL, LDLCALC, TRIG, CHOLHDL, LDLDIRECT in the last 72 hours. Thyroid Function Tests: No results for input(s): TSH, T4TOTAL, FREET4, T3FREE, THYROIDAB in the last 72 hours. Anemia Panel: No results for input(s): VITAMINB12, FOLATE, FERRITIN, TIBC, IRON, RETICCTPCT in the last 72 hours. Sepsis Labs: No results for input(s): PROCALCITON, LATICACIDVEN in the last 168 hours.  No results found for this or any previous visit (from the past 240 hour(s)).       Radiology Studies: No results found.      Scheduled Meds: . sodium chloride   Intravenous Once  . atorvastatin  40 mg Oral Daily  . feeding supplement (PRO-STAT SUGAR FREE 64)  30 mL Oral TID  . folic acid  1 mg Oral Daily  . hydrocortisone cream   Topical BID  . insulin aspart  0-15 Units Subcutaneous TID WC  . lactulose  20 g Oral BID  . mouth rinse  15 mL Mouth  Rinse BID  . multivitamin with minerals  1 tablet Oral Daily  . pantoprazole sodium  40 mg Oral Daily   Continuous Infusions: . sodium chloride Stopped (07/11/19 9038)          Aline August, MD [facility] 07/23/2019, 7:28 AM

## 2019-07-24 LAB — GLUCOSE, CAPILLARY
Glucose-Capillary: 116 mg/dL — ABNORMAL HIGH (ref 70–99)
Glucose-Capillary: 159 mg/dL — ABNORMAL HIGH (ref 70–99)
Glucose-Capillary: 173 mg/dL — ABNORMAL HIGH (ref 70–99)
Glucose-Capillary: 347 mg/dL — ABNORMAL HIGH (ref 70–99)

## 2019-07-24 LAB — BASIC METABOLIC PANEL
Anion gap: 8 (ref 5–15)
BUN: 44 mg/dL — ABNORMAL HIGH (ref 8–23)
CO2: 33 mmol/L — ABNORMAL HIGH (ref 22–32)
Calcium: 8.3 mg/dL — ABNORMAL LOW (ref 8.9–10.3)
Chloride: 106 mmol/L (ref 98–111)
Creatinine, Ser: 1.63 mg/dL — ABNORMAL HIGH (ref 0.44–1.00)
GFR calc Af Amer: 38 mL/min — ABNORMAL LOW (ref >60)
GFR calc non Af Amer: 33 mL/min — ABNORMAL LOW (ref >60)
Glucose, Bld: 131 mg/dL — ABNORMAL HIGH (ref 70–99)
Potassium: 3.9 mmol/L (ref 3.5–5.1)
Sodium: 147 mmol/L — ABNORMAL HIGH (ref 135–145)

## 2019-07-24 LAB — MAGNESIUM: Magnesium: 2 mg/dL (ref 1.7–2.4)

## 2019-07-24 LAB — CBC WITH DIFFERENTIAL/PLATELET
Abs Immature Granulocytes: 0 10*3/uL (ref 0.00–0.07)
Basophils Absolute: 0 10*3/uL (ref 0.0–0.1)
Basophils Relative: 0 %
Eosinophils Absolute: 0.1 10*3/uL (ref 0.0–0.5)
Eosinophils Relative: 2 %
HCT: 29.9 % — ABNORMAL LOW (ref 36.0–46.0)
Hemoglobin: 8.3 g/dL — ABNORMAL LOW (ref 12.0–15.0)
Lymphocytes Relative: 8 %
Lymphs Abs: 0.5 10*3/uL — ABNORMAL LOW (ref 0.7–4.0)
MCH: 28.5 pg (ref 26.0–34.0)
MCHC: 27.8 g/dL — ABNORMAL LOW (ref 30.0–36.0)
MCV: 102.7 fL — ABNORMAL HIGH (ref 80.0–100.0)
Monocytes Absolute: 0.2 10*3/uL (ref 0.1–1.0)
Monocytes Relative: 4 %
Neutro Abs: 5.3 10*3/uL (ref 1.7–7.7)
Neutrophils Relative %: 86 %
Platelets: 147 10*3/uL — ABNORMAL LOW (ref 150–400)
RBC: 2.91 MIL/uL — ABNORMAL LOW (ref 3.87–5.11)
RDW: 22.5 % — ABNORMAL HIGH (ref 11.5–15.5)
WBC: 6.2 10*3/uL (ref 4.0–10.5)
nRBC: 0 % (ref 0.0–0.2)
nRBC: 0 /100 WBC

## 2019-07-24 MED ORDER — INSULIN ASPART 100 UNIT/ML ~~LOC~~ SOLN
0.0000 [IU] | Freq: Three times a day (TID) | SUBCUTANEOUS | Status: DC
  Administered 2019-07-25: 3 [IU] via SUBCUTANEOUS
  Administered 2019-07-25 – 2019-07-26 (×2): 2 [IU] via SUBCUTANEOUS

## 2019-07-24 MED ORDER — INSULIN ASPART 100 UNIT/ML ~~LOC~~ SOLN
0.0000 [IU] | Freq: Every day | SUBCUTANEOUS | Status: DC
  Administered 2019-07-24: 4 [IU] via SUBCUTANEOUS

## 2019-07-24 NOTE — TOC Progression Note (Addendum)
Transition of Care [facility]) - Progression Note    Patient Details  Name: [name] MRN: [id number] Date of Birth: 1955/09/18  Transition of Care [facility]) CM/SW [place], [place] Phone Number: 07/24/2019, 6:10 PM  Clinical Narrative:     CSW attempted to call patient's spouse- unable to leave message- mail box was full. Called home phone number, no answer.  Thurmond Butts, MSW, [place] Clinical Social Worker   Expected Discharge Plan: Long Term Nursing Home Barriers to Discharge: Continued Medical Work up  Expected Discharge Plan and Services Expected Discharge Plan: [place]       Living arrangements for the past 2 months: [place] Expected Discharge Date:  (unknown)                                     Social Determinants of Health (SDOH) Interventions    Readmission Risk Interventions Readmission Risk Prevention Plan 05/28/2019 02/26/2019 12/17/2018  Transportation Screening Complete Complete Complete  PCP or Specialist Appt within 5-7 Days - - -  Home Care Screening - - -  Medication Review (RN CM) - - -  HRI or [place] Work Consult for [place] Planning/Counseling - - -  SW consult not completed comments - - -  Palliative Care Screening - - -  Medication Review Press photographer) Complete Complete Referral to Pharmacy  PCP or Specialist appointment within 3-5 days of discharge - (No Data) Not Complete  PCP/Specialist Appt Not Complete comments - - DC date unknown but pt is established with providers  [place] or [place] Complete Complete Not Complete  HRI or Home Care Consult Pt Refusal Comments - - SNF resident  SW Recovery Care/Counseling Consult Complete - Complete  Palliative Care Screening Not Applicable Not Applicable Not Complete  Comments - - pending need  [facility] Complete Patient Refused Complete  Some recent data might be hidden

## 2019-07-24 NOTE — Progress Notes (Signed)
Nurse spoke with pt's sister Raiford Noble regarding plan of care.

## 2019-07-24 NOTE — Progress Notes (Signed)
[name]  PROGRESS NOTE    [name]  IFO:[id number] DOB: 08/30/1955 DOA: 06/27/2019 PCP: [place]   Brief Narrative:   64 year old chronically ill female resident of [facility] SN[facility] with multiple medical problems including CAD, COPD, CKD stage IIIb, chronic diastolic CHF, hep C, cirrhosis was admitted with sepsis, encephalopathy and extensive full-thickness tissue loss of right foot. MRI indicated right foot abscess, myositis and tenosynovitis as well as left foot myositis and cellulitis. 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  She was unable to be extubated postop and was admitted to the ICU on 07/04/19.  07/24/19: No acute events ON. Recs are for SNF. CM working on options.    Assessment & Plan:   Active Problems:   Acute hypoxemic respiratory failure (HCC)   NSTEMI (non-ST elevated myocardial infarction) (HCC)   COPD (chronic obstructive pulmonary disease) (HCC)   Anxiety   CAD (coronary artery disease)   Chronic back pain   Chronic hepatitis C without hepatic coma (HCC)   Diabetes mellitus type 2, uncontrolled (HCC)   Hypertension   Sleep apnea   Other cirrhosis of liver (HCC)   Acute metabolic encephalopathy   Type 2 diabetes mellitus with stage 3b chronic kidney disease, with long-term current use of insulin (HCC)   Chronic systolic CHF (congestive heart failure) (HCC)   Venous stasis ulcer ([place])   Advanced care planning/counseling discussion   Respiratory acidosis   Hyperkalemia   Palliative care encounter   DNR (do not resuscitate)  Severe sepsis: Present on admission Right foot abscess, myositis     - 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  Monitor mental status.     - Fall precautions.  PT/OT recommends SNF placement     - SLP has eval'd. Tube feeds discontinued and orals started     - Dietician following; appreciate assistance. Encourage diet.   Hemorrhagic shock Postop bleeding     - Appears to have resolved.  Status post 7 units packed red cells transfusion during this hospitalization.       - Hemoglobin 8.3 today.  Monitor H&H.  Transfuse if hemoglobin is less than 7.       - Received IV iron on 07/14/2019  Acute on chronic hypoxic respiratory failure Acute on chronic diastolic heart failure COPD     - Mechanical ventilation from 07/04/2019-07/09/2019     - Continue BiPAP nightly and daytime during naps     - Diuretics were briefly held for few days giving worsening sodium went overnight on 07/16/2019, patient was more hypoxic with chest x-ray showing fluid overload and subsequently intravenous Lasix was restarted.  Strict input and output.  Daily weights.  Fluid restriction.      - Chest x-ray from 07/18/2019 did show mild bibasilar atelectasis or edema with probable small pleural effusions.  Lasix dose was increased to 80 mg IV every 12 hours on 07/18/2019.  Lasix discontinued  on 07/20/2019 because of sodium reaching 150.     - Currently requiring 2-3 L oxygen via nasal cannula; continue lasix 38m PO qday  Acute kidney injury on chronic kidney disease stage IIIb     - SCr down to 1.63 today, follow  Hypomagnesemia     - resolved  Hepatic encephalopathy Hepatitis C Liver cirrhosis     - Continue lactulose; patient intermittently refuses lactulose.       - Lasix as above.  Overall poor prognosis.  Diabetes mellitus type 2     - Continue CBGs with SSI.  Generalized deconditioning     - Overall prognosis is very  poor because of multiple serious medical problems including vascular disease/CAD//COPD/OSA/chronic renal disease stage IIIb/liver cirrhosis/multiple frequent hospitalizations     - Palliative care has evaluated the patient and has had family discussion during the hospitalization.  Recommend outpatient palliative care follow-up.     - If condition worsens, consider hospice/comfort measures     - will need SNF placement.  Not a candidate for LTACH as per social worker  DVT prophylaxis: SCD Code Status: DNR Family Communication: None at bedside   Status is: Inpatient  Remains inpatient appropriate because:Unsafe d/c plan   Dispo: The patient is from: Home              Anticipated d/c is to: SNF              Anticipated d/c date is: 2 days              Patient currently is not medically stable to d/c.  Consultants:   PCCM  Vasc Surg  Palliative Care  Procedures:   RAKA  ROS:  Unable to obtain d/t mentation  Subjective: "[name]  Objective: Vitals:   07/23/19 1209 07/23/19 1945 07/24/19 0426 07/24/19 1200  BP: 131/78 137/70 131/76 (!) 142/68  Pulse: 73 77 79 78  Resp: 16 20 18 18   Temp: (!) 97.3 F (36.3 C) (!) 97.5 F (36.4 C) (!) 97.5 F (36.4 C) 97.8 F (36.6 C)  TempSrc: Oral Oral Oral Oral  SpO2: 98% 100% 100% 100%  Weight:   82 kg   Height:        Intake/Output Summary (Last 24 hours) at 07/24/2019 1841 Last data filed at 07/24/2019 1300 Gross per 24 hour  Intake 822 ml  Output 900 ml  Net -78 ml   Filed Weights   07/22/19 0523 07/23/19 0356 07/24/19 0426  Weight: 83 kg 84 kg 82 kg    Examination:  General: 64y.o. female resting in bed in NAD Cardiovascular: RRR, +S1, S2, no m/g/r, equal pulses throughout Respiratory: slightly decreased at bases, normal WOB GI: BS+, NDNT, no masses noted, no organomegaly noted MSK: RAKA, L great toe amputation Neuro: Alert to name only, follows some commands   Data Reviewed: I have personally reviewed  following labs and imaging studies.  CBC: Recent Labs  Lab 07/20/19 0519 07/21/19 0339 07/22/19 0217 07/23/19 0421 07/24/19 0813  WBC 7.2 8.1 7.6 7.6 6.2  NEUTROABS 5.5 6.3 5.6 6.0 5.3  HGB 8.2* 7.9* 7.9* 7.9* 8.3*  HCT 29.1* 28.4* 28.6* 29.1* 29.9*  MCV 101.0* 99.6 102.1* 105.1* 102.7*  PLT 180 180 168 161 1102   Basic Metabolic Panel: Recent Labs  Lab 07/20/19 0519 07/21/19 0339 07/22/19 0217 07/23/19 0421 07/24/19 0813  NA 150* 148* 147* 146* 147*  K 3.9 3.8 3.7 3.9 3.9  CL 108 106 109 105 106  CO2 32 27 28 31  33*  GLUCOSE 112* 151* 152* 176* 131*  BUN 45* 45* 44* 46* 44*  CREATININE 2.21* 2.07* 1.92* 1.91* 1.63*  CALCIUM 8.7* 8.5* 8.0* 8.1* 8.3*  MG 1.7 1.7 1.6* 2.0 2.0   GFR: Estimated Creatinine Clearance: 36.9 mL/min (A) (by C-G formula based on SCr of 1.63 mg/dL (H)). Liver Function Tests: Recent Labs  Lab 07/18/19 0118 07/19/19 0352  AST 19 15  ALT 12 11  ALKPHOS 111 104  BILITOT 1.4* 1.0  PROT 8.5* 8.8*  ALBUMIN 2.2* 2.2*   No results for input(s): LIPASE, AMYLASE in the last 168 hours. Recent Labs  Lab 07/18/19 0118 07/23/19 0421  AMMONIA 52* 34   Coagulation Profile: No results for input(s): INR, PROTIME in the last 168 hours. Cardiac Enzymes: No results for input(s): CKTOTAL, CKMB, CKMBINDEX, TROPONINI in the last 168 hours. BNP (last 3 results) No results for input(s): PROBNP in the last 8760 hours. HbA1C: No results for input(s): HGBA1C in the last 72 hours. CBG: Recent Labs  Lab 07/23/19 1605 07/23/19 2113 07/24/19 0624 07/24/19 1108 07/24/19 1610  GLUCAP 116* 150* 116* 159* 173*   Lipid Profile: No results for input(s): CHOL, HDL, LDLCALC, TRIG, CHOLHDL, LDLDIRECT in the last 72 hours. Thyroid Function Tests: No results for input(s): TSH, T4TOTAL, FREET4, T3FREE, THYROIDAB in the last 72 hours. Anemia Panel: No results for input(s): VITAMINB12, FOLATE, FERRITIN, TIBC, IRON, RETICCTPCT in the last 72 hours. Sepsis  Labs: No results for input(s): PROCALCITON, LATICACIDVEN in the last 168 hours.  Recent Results (from the past 240 hour(s))  SARS Coronavirus 2 by RT PCR (hospital order, performed in [facility] hospital lab) Nasopharyngeal Nasopharyngeal Swab     Status: None   Collection Time: 07/23/19 12:46 PM   Specimen: Nasopharyngeal Swab  Result Value Ref Range Status   SARS Coronavirus 2 NEGATIVE NEGATIVE Final    Comment: (NOTE) SARS-CoV-2 target nucleic acids are NOT DETECTED.  The SARS-CoV-2 RNA is generally detectable in upper and lower respiratory specimens during the acute phase of infection. The lowest concentration of SARS-CoV-2 viral copies this assay can detect is 250 copies / mL. A negative result does not preclude SARS-CoV-2 infection and should not be used as the sole basis for treatment or other patient management decisions.  A negative result may occur with improper specimen collection / handling, submission of specimen other than nasopharyngeal swab, presence of viral mutation(s) within the areas targeted by this assay, and inadequate number of viral copies (<250 copies / mL). A negative result must be combined with clinical observations, patient history, and epidemiological information.  Fact Sheet for Patients:   StrictlyIdeas.no  Fact Sheet for Healthcare Providers: BankingDealers.co.za  This test is not yet approved or  cleared by the [place] FDA and has been authorized for detection and/or diagnosis of SARS-CoV-2 by FDA under an Emergency Use Authorization (EUA).  This EUA will remain in effect (meaning this test can be used) for the duration of the COVID-19 declaration under Section 564(b)(1) of the Act, 21 U.S.C. section 360bbb-3(b)(1), unless the authorization is terminated or revoked sooner.  Performed at [facility], [place] [address], [place], [place] [address]       Radiology Studies: No results  found.   Scheduled Meds: . sodium chloride   Intravenous Once  . atorvastatin  40 mg Oral Daily  . feeding supplement (PRO-STAT SUGAR FREE 64)  30 mL Oral TID  . folic acid  1 mg Oral Daily  .  furosemide  40 mg Oral Daily  . hydrocortisone cream   Topical BID  . insulin aspart  0-15 Units Subcutaneous TID WC  . lactulose  20 g Oral BID  . mouth rinse  15 mL Mouth Rinse BID  . multivitamin with minerals  1 tablet Oral Daily  . pantoprazole sodium  40 mg Oral Daily   Continuous Infusions: . sodium chloride Stopped (07/11/19 0608)     LOS: 27 days    Time spent: 25 minutes spent in the coordination of care today.    Jonnie Finner, DO [facility]  If 7PM-7AM, please contact night-coverage [email or web address] 07/24/2019, 6:41 PM

## 2019-07-24 NOTE — Progress Notes (Signed)
Palliative Medicine RN Note: Mr [name] called the PMT line to discuss what beds/facilities are being offered. Our team signed off 7/2.   I reviewed chart; barriers to discharge are related to disposition, and there remains no role for PMT involvement with clear GOC.   Secure message sent to [facility] with request that she return call to husband [name] [phone]).  Marjie Skiff Shoichi Mielke, RN, BSN, [facility] Palliative Medicine Team 07/24/2019 1:45 PM Office [phone]

## 2019-07-25 LAB — CBC WITH DIFFERENTIAL/PLATELET
Abs Immature Granulocytes: 0.03 10*3/uL (ref 0.00–0.07)
Basophils Absolute: 0 10*3/uL (ref 0.0–0.1)
Basophils Relative: 1 %
Eosinophils Absolute: 0.2 10*3/uL (ref 0.0–0.5)
Eosinophils Relative: 4 %
HCT: 31.2 % — ABNORMAL LOW (ref 36.0–46.0)
Hemoglobin: 8.6 g/dL — ABNORMAL LOW (ref 12.0–15.0)
Immature Granulocytes: 1 %
Lymphocytes Relative: 13 %
Lymphs Abs: 0.8 10*3/uL (ref 0.7–4.0)
MCH: 27.9 pg (ref 26.0–34.0)
MCHC: 27.6 g/dL — ABNORMAL LOW (ref 30.0–36.0)
MCV: 101.3 fL — ABNORMAL HIGH (ref 80.0–100.0)
Monocytes Absolute: 0.5 10*3/uL (ref 0.1–1.0)
Monocytes Relative: 9 %
Neutro Abs: 4.2 10*3/uL (ref 1.7–7.7)
Neutrophils Relative %: 72 %
Platelets: 135 10*3/uL — ABNORMAL LOW (ref 150–400)
RBC: 3.08 MIL/uL — ABNORMAL LOW (ref 3.87–5.11)
RDW: 22.2 % — ABNORMAL HIGH (ref 11.5–15.5)
WBC: 5.8 10*3/uL (ref 4.0–10.5)
nRBC: 0 % (ref 0.0–0.2)

## 2019-07-25 LAB — COMPREHENSIVE METABOLIC PANEL
ALT: 11 U/L (ref 0–44)
AST: 19 U/L (ref 15–41)
Albumin: 2.4 g/dL — ABNORMAL LOW (ref 3.5–5.0)
Alkaline Phosphatase: 122 U/L (ref 38–126)
Anion gap: 10 (ref 5–15)
BUN: 42 mg/dL — ABNORMAL HIGH (ref 8–23)
CO2: 30 mmol/L (ref 22–32)
Calcium: 8.2 mg/dL — ABNORMAL LOW (ref 8.9–10.3)
Chloride: 103 mmol/L (ref 98–111)
Creatinine, Ser: 1.6 mg/dL — ABNORMAL HIGH (ref 0.44–1.00)
GFR calc Af Amer: 39 mL/min — ABNORMAL LOW (ref >60)
GFR calc non Af Amer: 34 mL/min — ABNORMAL LOW (ref >60)
Glucose, Bld: 104 mg/dL — ABNORMAL HIGH (ref 70–99)
Potassium: 3.9 mmol/L (ref 3.5–5.1)
Sodium: 143 mmol/L (ref 135–145)
Total Bilirubin: 1 mg/dL (ref 0.3–1.2)
Total Protein: 8.8 g/dL — ABNORMAL HIGH (ref 6.5–8.1)

## 2019-07-25 LAB — GLUCOSE, CAPILLARY
Glucose-Capillary: 111 mg/dL — ABNORMAL HIGH (ref 70–99)
Glucose-Capillary: 159 mg/dL — ABNORMAL HIGH (ref 70–99)
Glucose-Capillary: 150 mg/dL — ABNORMAL HIGH (ref 70–99)
Glucose-Capillary: 135 mg/dL — ABNORMAL HIGH (ref 70–99)

## 2019-07-25 LAB — MAGNESIUM: Magnesium: 1.8 mg/dL (ref 1.7–2.4)

## 2019-07-25 NOTE — TOC Progression Note (Signed)
Transition of Care [facility]) - Progression Note    Patient Details  Name: [name] MRN: [id number] Date of Birth: 17-Mar-1955  Transition of Care [facility]) CM/SW [place], [place] Phone Number: 07/25/2019, 11:36 AM  Clinical Narrative:     CSW spoke with patient's spouse,Wendell. CSW introduced self and explained role. Kela Millin confirmed patient is form [employer] and now, agreeable to patient returning back to [facility].   CSW sent referral and message to [facility]- waiting on response   Thurmond Butts, MSW, [place] Social Worker     Expected Discharge Plan: Long Term Nursing Home Barriers to Discharge: Continued Medical Work up  Expected Discharge Plan and Services Expected Discharge Plan: [place]       Living arrangements for the past 2 months: [place] Expected Discharge Date:  (unknown)                                     Social Determinants of Health (SDOH) Interventions    Readmission Risk Interventions Readmission Risk Prevention Plan 05/28/2019 02/26/2019 12/17/2018  Transportation Screening Complete Complete Complete  PCP or Specialist Appt within 5-7 Days - - -  Home Care Screening - - -  Medication Review (RN CM) - - -  HRI or [place] Work Consult for [place] Planning/Counseling - - -  SW consult not completed comments - - -  Palliative Care Screening - - -  Medication Review Press photographer) Complete Complete Referral to Pharmacy  PCP or Specialist appointment within 3-5 days of discharge - (No Data) Not Complete  PCP/Specialist Appt Not Complete comments - - DC date unknown but pt is established with providers  [place] or [place] Complete Complete Not Complete  HRI or Home Care Consult Pt Refusal Comments - - SNF resident  SW Recovery Care/Counseling Consult Complete - Complete  Palliative Care Screening Not Applicable Not Applicable Not Complete   Comments - - pending need  [place] Complete Patient Refused Complete  Some recent data might be hidden

## 2019-07-25 NOTE — Progress Notes (Signed)
[name]  PROGRESS NOTE    JALILA GOODNOUGH  TKZ:[id number] DOB: 09-18-1955 DOA: 06/27/2019 PCP: [place]   Brief Narrative:   64 year old chronically ill female resident of [facility] SNF with multiple medical problems including CAD, COPD, CKD stage IIIb, chronic diastolic CHF, hep C, cirrhosis was admitted with sepsis, encephalopathy and extensive full-thickness tissue loss of right foot. MRI indicated right foot abscess, myositis and tenosynovitis as well as left foot myositis and cellulitis. 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 She was unable to be extubated postop and was admitted to the ICU on 07/04/19.  07/25/19: Labs stable. She is a little more alert this morning and interactive. She denies complaints. Per TOC note, patient has a bed offer at [facility] for tomorrow. She is medically stable to discharge. Spoke with husband by phone. Updated with plan.  Assessment & Plan:   Active Problems:   Acute hypoxemic respiratory failure (HCC)   NSTEMI (non-ST elevated myocardial infarction) (HCC)   COPD (chronic obstructive pulmonary disease) (HCC)   Anxiety   CAD (coronary artery disease)   Chronic back pain   Chronic hepatitis C without hepatic coma (HCC)   Diabetes mellitus type 2, uncontrolled (HCC)   Hypertension   Sleep apnea   Other cirrhosis of liver (HCC)   Acute metabolic encephalopathy   Type 2 diabetes mellitus with stage 3b chronic kidney disease, with long-term current use of insulin (HCC)   Chronic systolic CHF (congestive heart failure) (HCC)   Venous stasis ulcer ([place])   Advanced care planning/counseling discussion   Respiratory acidosis   Hyperkalemia   Palliative care encounter   DNR (do not resuscitate)  Severe sepsis: Present on admission Right foot abscess, myositis     - 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   Acute toxic and metabolic encephalopathy     - Secondary to severe sepsis, hepatic encephalopathy     - Also combination of ICU delirium     - Very slow improvement; but fluctuating mental status     - Patient was slightly more lethargic on 07/15/2019 with ammonia level of 57: Lactulose dose was increased.  Monitor mental status.     - Fall precautions.  PT/OT recommends SNF placement     - SLP has eval'd. Tube feeds discontinued and orals started     - Dietician following; appreciate assistance. Encourage diet.      - 7/8: she is a little more alert to day (name, place); following commands. Has a spot at [facility] for tomorrow.   Hemorrhagic shock Postop bleeding     - Appears to have resolved.  Status post 7 units packed red cells transfusion during this hospitalization.        - Received IV iron on 07/14/2019     - 7/8: Hgb stable at 8.6. no signs of bleed. Follow.   Acute on chronic hypoxic respiratory failure Acute on chronic diastolic heart failure COPD     - Mechanical ventilation from 07/04/2019-07/09/2019     - Continue BiPAP nightly and daytime during naps     - Diuretics were briefly held for few days giving worsening sodium went overnight on 07/16/2019, patient was more hypoxic with chest x-ray showing fluid overload and subsequently intravenous  Lasix was restarted.  Strict input and output.  Daily weights.  Fluid restriction.      - Chest x-ray from 07/18/2019 did show mild bibasilar atelectasis or edema with probable small pleural effusions.  Lasix dose was increased to 80 mg IV every 12 hours on 07/18/2019.  Lasix discontinued on 07/20/2019 because of sodium reaching 150.     - Currently requiring 2-3 L oxygen via nasal cannula; continue lasix 72m PO qday     - 7/8: On RA and comfortable during interview. Continue current Tx  Acute kidney injury on chronic kidney disease stage IIIb     - 7/8: SCr  stable at 1.60 today; follow  Hypomagnesemia     - resolved  Hepatic encephalopathy Hepatitis C Liver cirrhosis     - Continue lactulose; patient intermittently refuses lactulose.       - Lasix as above.  Overall poor prognosis.  Diabetes mellitus type 2     - Continue CBGs with SSI.  Generalized deconditioning     - Overall prognosis is very poor because of multiple serious medical problems including vascular disease/CAD//COPD/OSA/chronic renal disease stage IIIb/liver cirrhosis/multiple frequent hospitalizations     - Palliative care has evaluated the patient and has had family discussion during the hospitalization.  Recommend outpatient palliative care follow-up.     - If condition worsens, consider hospice/comfort measures     - will need SNF placement.  Not a candidate for LTACH as per social worker     - 7[facility]8: [facility] Currently medically stable.  DVT prophylaxis: SCD Code Status: DNR Family Communication: Spoke with husband by phone   Status is: Inpatient  Remains inpatient appropriate because:Unsafe d/c plan   Dispo: The patient is from: Home              Anticipated d/c is to: SNF              Anticipated d/c date is: 1 day              Patient currently is medically stable to d/c.  Consultants:   PCCM  Vasc Surg  Palliative Care  Procedures:   RAKA  ROS:  Denies CP, dyspnea, N, V . Remainder 10-pt ROS is negative for all not previously mentioned.  Subjective: "It's ok."  Objective: Vitals:   07/24/19 2018 07/25/19 0400 07/25/19 0459 07/25/19 0854  BP: 140/70  137/64 139/63  Pulse: 79  82 93  Resp: 18  20 18   Temp: 97.9 F (36.6 C)  98.4 F (36.9 C) 97.8 F (36.6 C)  TempSrc: Oral  Oral Oral  SpO2: 100%  92% 95%  Weight:  89 kg    Height:        Intake/Output Summary (Last 24 hours) at 07/25/2019 1452 Last data filed at 07/25/2019 08546Gross per 24 hour  Intake 1200 ml  Output 600 ml  Net 600 ml   Filed  Weights   07/23/19 0356 07/24/19 0426 07/25/19 0400  Weight: 84 kg 82 kg 89 kg    Examination:  General: 64y.o. female resting in bed in NAD Cardiovascular: RRR, +S1, S2, no m/g/r, equal pulses throughout Respiratory: CTABL, no w/r/r, normal WOB GI: BS+, NDNT, soft MSK: No c/c; RAKA (staples/incision site clean), left great toe amputation Neuro: alert to name/place; follows commands  Data Reviewed: I have personally reviewed following labs and imaging studies.  CBC: Recent Labs  Lab 07/21/19 0339 07/22/19 0217 07/23/19 0421 07/24/19  0813 07/25/19 0516  WBC 8.1 7.6 7.6 6.2 5.8  NEUTROABS 6.3 5.6 6.0 5.3 4.2  HGB 7.9* 7.9* 7.9* 8.3* 8.6*  HCT 28.4* 28.6* 29.1* 29.9* 31.2*  MCV 99.6 102.1* 105.1* 102.7* 101.3*  PLT 180 168 161 147* 702*   Basic Metabolic Panel: Recent Labs  Lab 07/21/19 0339 07/22/19 0217 07/23/19 0421 07/24/19 0813 07/25/19 0516  NA 148* 147* 146* 147* 143  K 3.8 3.7 3.9 3.9 3.9  CL 106 109 105 106 103  CO2 27 28 31  33* 30  GLUCOSE 151* 152* 176* 131* 104*  BUN 45* 44* 46* 44* 42*  CREATININE 2.07* 1.92* 1.91* 1.63* 1.60*  CALCIUM 8.5* 8.0* 8.1* 8.3* 8.2*  MG 1.7 1.6* 2.0 2.0 1.8   GFR: Estimated Creatinine Clearance: 39.1 mL/min (A) (by C-G formula based on SCr of 1.6 mg/dL (H)). Liver Function Tests: Recent Labs  Lab 07/19/19 0352 07/25/19 0516  AST 15 19  ALT 11 11  ALKPHOS 104 122  BILITOT 1.0 1.0  PROT 8.8* 8.8*  ALBUMIN 2.2* 2.4*   No results for input(s): LIPASE, AMYLASE in the last 168 hours. Recent Labs  Lab 07/23/19 0421  AMMONIA 34   Coagulation Profile: No results for input(s): INR, PROTIME in the last 168 hours. Cardiac Enzymes: No results for input(s): CKTOTAL, CKMB, CKMBINDEX, TROPONINI in the last 168 hours. BNP (last 3 results) No results for input(s): PROBNP in the last 8760 hours. HbA1C: No results for input(s): HGBA1C in the last 72 hours. CBG: Recent Labs  Lab 07/24/19 1108 07/24/19 1610  07/24/19 2119 07/25/19 0632 07/25/19 1116  GLUCAP 159* 173* 347* 111* 159*   Lipid Profile: No results for input(s): CHOL, HDL, LDLCALC, TRIG, CHOLHDL, LDLDIRECT in the last 72 hours. Thyroid Function Tests: No results for input(s): TSH, T4TOTAL, FREET4, T3FREE, THYROIDAB in the last 72 hours. Anemia Panel: No results for input(s): VITAMINB12, FOLATE, FERRITIN, TIBC, IRON, RETICCTPCT in the last 72 hours. Sepsis Labs: No results for input(s): PROCALCITON, LATICACIDVEN in the last 168 hours.  Recent Results (from the past 240 hour(s))  SARS Coronavirus 2 by RT PCR (hospital order, performed in [facility] hospital lab) Nasopharyngeal Nasopharyngeal Swab     Status: None   Collection Time: 07/23/19 12:46 PM   Specimen: Nasopharyngeal Swab  Result Value Ref Range Status   SARS Coronavirus 2 NEGATIVE NEGATIVE Final    Comment: (NOTE) SARS-CoV-2 target nucleic acids are NOT DETECTED.  The SARS-CoV-2 RNA is generally detectable in upper and lower respiratory specimens during the acute phase of infection. The lowest concentration of SARS-CoV-2 viral copies this assay can detect is 250 copies / mL. A negative result does not preclude SARS-CoV-2 infection and should not be used as the sole basis for treatment or other patient management decisions.  A negative result may occur with improper specimen collection / handling, submission of specimen other than nasopharyngeal swab, presence of viral mutation(s) within the areas targeted by this assay, and inadequate number of viral copies (<250 copies / mL). A negative result must be combined with clinical observations, patient history, and epidemiological information.  Fact Sheet for Patients:   StrictlyIdeas.no  Fact Sheet for Healthcare Providers: BankingDealers.co.za  This test is not yet approved or  cleared by the [place] FDA and has been authorized for detection and/or diagnosis of  SARS-CoV-2 by FDA under an Emergency Use Authorization (EUA).  This EUA will remain in effect (meaning this test can be used) for the duration of the COVID-19 declaration under  Section 564(b)(1) of the Act, 21 U.S.C. section 360bbb-3(b)(1), unless the authorization is terminated or revoked sooner.  Performed at [facility], [place] [address] [place], [place] [address]       Radiology Studies: No results found.   Scheduled Meds: . sodium chloride   Intravenous Once  . atorvastatin  40 mg Oral Daily  . feeding supplement (PRO-STAT SUGAR FREE 64)  30 mL Oral TID  . folic acid  1 mg Oral Daily  . furosemide  40 mg Oral Daily  . hydrocortisone cream   Topical BID  . insulin aspart  0-15 Units Subcutaneous TID WC  . insulin aspart  0-5 Units Subcutaneous QHS  . lactulose  20 g Oral BID  . mouth rinse  15 mL Mouth Rinse BID  . multivitamin with minerals  1 tablet Oral Daily  . pantoprazole sodium  40 mg Oral Daily   Continuous Infusions: . sodium chloride Stopped (07/11/19 0608)     LOS: 28 days    Time spent: 25 minutes spent in the coordination of care today.    Jonnie Finner, DO [facility]  If 7PM-7AM, please contact night-coverage [email or web address] 07/25/2019, 2:52 PM

## 2019-07-25 NOTE — Progress Notes (Signed)
Physical Therapy Treatment Patient Details Name: [name] MRN: [id number] DOB: [birth year] Today's Date: 07/25/2019    History of Present Illness Pt is a 64 y.o. female admitted from SNF on 06/27/19 with AMS. 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 Unable to be extubated following surgery. CCM was consulted for ventilator management. Extubated 6/22. Pt with hepatic encephalopathy secondary to severe sepsis. CXR 7/1 with mild bibasilar atelectasis or edema with probable small pleural effusions. PMH includes CHF with anasarca, NSTEMI, COPD, DM 2, anemia, CKD IIIB, hepatitis C with cirrhosis.   PT Comments    Pt initially lethargic, alertness improving with mobility. Requires maxA for bed mobility, totalA for tranfer to recliner with maximove lift, tolerated well. Pt unaware of bowel/urine incontinence, dependent for pericare and washup. Once seated in recliner, pt able to demonstrate good trunk activation for seated tasks, but session limited by pt not consistently following one-step commands, staring blankly majority of session. Able to state name and answer questions, but inconsistent with this as well. Continue to recommend SNF-level therapies to maximize functional mobility and independence.    Follow Up Recommendations  SNF;LTACH;Supervision/Assistance - 24 hour     Equipment Recommendations   (defer)    Recommendations for Other Services       Precautions / Restrictions Precautions Precautions: Fall;Other (comment) Precaution Comments: right AKA    Mobility  Bed Mobility Overal bed mobility: Needs Assistance Bed Mobility: Rolling Rolling: Max assist;+2 for safety/equipment         General bed mobility comments: MaxA to roll R/L, pt not initiating movement but would hold rail when hand placed on it; unaware of bowel incontinence. Multiple rolls R/L for pericare, linen change and maximove pad  placement  Transfers Overall transfer level: Needs assistance               General transfer comment: Maximove lift to recliner, pt tolerated well with no c/o  Ambulation/Gait                 Stairs             Wheelchair Mobility    Modified Rankin (Stroke Patients Only)       Balance Overall balance assessment: Needs assistance Sitting-balance support: Bilateral upper extremity supported Sitting balance-Leahy Scale: Fair Sitting balance - Comments: Will use LUE to pull self forward in recliner and able to maintain static sitting without back support and min guard; would not repeat this to command                                    Cognition Arousal/Alertness: Lethargic Behavior During Therapy: Flat affect Overall Cognitive Status: No family/caregiver present to determine baseline cognitive functioning Area of Impairment: Attention;Following commands;Problem solving                   Current Attention Level: Focused;Sustained   Following Commands: Follows one step commands inconsistently;Follows one step commands with increased time     Problem Solving: Slow processing;Requires verbal cues;Decreased initiation;Difficulty sequencing;Requires tactile cues General Comments: Pt keeping eyes open majority of session, max cues to answer questions, able to do so appropriately but few and far between, quiet majority of session. Inconsistent command following, appropriate <25% of session. Seemed appreciative to sit in recliner, more alert once up      Exercises  General Comments General comments (skin integrity, edema, etc.): SpO2 100% on 2L O2 at beginning of session, removed and SpO2 reading 64% on RA after bed mobility (unsure if reliable read, pt with DOE 2/4); quick to return to 97% when 2L replaced      Pertinent Vitals/Pain Pain Assessment: Faces Faces Pain Scale: No hurt Pain Intervention(s): Monitored during session     Home Living                      Prior Function            PT Goals (current goals can now be found in the care plan section) Acute Rehab PT Goals Patient Stated Goal: No goals stated Progress towards PT goals: Progressing toward goals    Frequency    Min 2X/week      PT Plan Current plan remains appropriate    Co-evaluation              AM-PAC PT "6 Clicks" Mobility   Outcome Measure  Help needed turning from your back to your side while in a flat bed without using bedrails?: A Lot Help needed moving from lying on your back to sitting on the side of a flat bed without using bedrails?: A Lot Help needed moving to and from a bed to a chair (including a wheelchair)?: Total Help needed standing up from a chair using your arms (e.g., wheelchair or bedside chair)?: Total Help needed to walk in hospital room?: Total Help needed climbing 3-5 steps with a railing? : Total 6 Click Score: 8    End of Session Equipment Utilized During Treatment: Oxygen Activity Tolerance: Patient tolerated treatment well Patient left: in chair;with call bell/phone within reach;with chair alarm set Nurse Communication: Mobility status;Need for lift equipment PT Visit Diagnosis: Other abnormalities of gait and mobility (R26.89);Muscle weakness (generalized) (M62.81)     Time: 9166-0600 PT Time Calculation (min) (ACUTE ONLY): 35 min  Charges:  $Therapeutic Activity: 23-37 mins                     [name], PT, DPT Acute Rehabilitation Services  Pager [phone] Office [phone]  [name] 07/25/2019, 12:32 PM

## 2019-07-25 NOTE — Progress Notes (Signed)
Nutrition Follow-up  DOCUMENTATION CODES:   Obesity unspecified  INTERVENTION:   -Continue 30 ml Prostat TID, each supplement provides 100 kcals and 15 grams protein -Continue MVI with minerals daily -ContinueMagic cup TID with meals, each supplement provides 290 kcal and 9 grams of protein -Continue Hormel Shake TID with meals, each supplement provides 520 kcals and 22 grams protein -Continue feeding assistance with meals  NUTRITION DIAGNOSIS:   Increased nutrient needs related to wound healing as evidenced by estimated needs.  Ongoing  GOAL:   Patient will meet greater than or equal to 90% of their needs  Progressing   MONITOR:   PO intake, Supplement acceptance, Diet advancement, Labs, Weight trends, Skin, I & O's  REASON FOR ASSESSMENT:   Consult Enteral/tube feeding initiation and management  ASSESSMENT:   Patient with PMH significant for CHF, NSTEMI, COPD, DM, gastric ulcer, chronic hepatitis C, cirrhosis of liver, and CKD III. Presents this admission with acute exacerbation CHF and bilateral lower extremity wounds.  17 - s/p R AKA 6/18 - Cortrak placed, tip gastric 6/19 - pt vomited TF and bile, TF stopped 6/20 - TF restarted 6/22 - extubated 6/25- cortrak removed, TF d/c, s/p BSE- advanced to heart healthy/ carb modified diet  Reviewed I/O's: +760 ml x 24 hours and -3 L since 07/11/19  UOP: 800 ml x 24 hours  Per VVS notes, pt with non-infected lt heel ulcer. No plans for vascular intervention; will let heel demarcate due to high amputation risk.   Attempted to speak with pt via hospital room phone, however, no answer.   Pt remains with poor to fair oral intake. Noted meal completion 25-50%, which has been stable since last visit. Pt with variable acceptance of Prostat.   Per TOC notes, awaiting SNF placement for discharge.   Medications reviewed and include lactulose.   Labs reviewed: CBGS: 111-347 (inpatient orders for glycemic control are 0-15  units insulin aspart TID with meals and 0-5 units insulin aspart daily at bedtime).   Diet Order:   Diet Order            Diet heart healthy/carb modified Room service appropriate? Yes; Fluid consistency: Thin; Fluid restriction: 1500 mL Fluid  Diet effective now                 EDUCATION NEEDS:   Not appropriate for education at this time  Skin:  Skin Assessment: Skin Integrity Issues: Skin Integrity Issues:: DTI, Stage II DTI: lt heel Stage II: lt thigh Unstageable: - Incisions: closed rt thigh (s/p rt AKA) Other: wound to mid scarum  Last BM:  07/25/19  Height:   Ht Readings from Last 1 Encounters:  06/28/19 5' 5"  (1.651 m)    Weight:   Wt Readings from Last 1 Encounters:  07/25/19 89 kg    Ideal Body Weight:  52.3 kg (adjusted for AKA)  BMI:  Body mass index is 32.65 kg/m.  Estimated Nutritional Needs:   Kcal:  1900-2100  Protein:  100-115 grams  Fluid:  >/= 1.9 L/day    Loistine Chance, RD, LDN, CDCES Registered Dietitian II Certified Diabetes Care and Education Specialist Please refer to [facility] for RD and/or RD on-call/weekend/after hours pager

## 2019-07-25 NOTE — TOC Progression Note (Signed)
Transition of Care [facility]) - Progression Note    Patient Details  Name: [name] MRN: [id number] Date of Birth: 06/16/1955  Transition of Care [facility]) CM/SW [place], [place] Phone Number: 07/25/2019, 1:57 PM  Clinical Narrative:     [facility] confirmed bed offer and will be prepared to admit patient tomorrow.  CSW called and informed patient's spouse,Wendell- unable to leave voice message- called home and cell #  Thurmond Butts, MSW, [place] Social Worker   Expected Discharge Plan: Long Term Nursing Home Barriers to Discharge: Continued Medical Work up  Expected Discharge Plan and Services Expected Discharge Plan: [place]       Living arrangements for the past 2 months: [place] Expected Discharge Date:  (unknown)                                     Social Determinants of Health (SDOH) Interventions    Readmission Risk Interventions Readmission Risk Prevention Plan 05/28/2019 02/26/2019 12/17/2018  Transportation Screening Complete Complete Complete  PCP or Specialist Appt within 5-7 Days - - -  Home Care Screening - - -  Medication Review (RN CM) - - -  HRI or [place] Work Consult for [place] Planning/Counseling - - -  SW consult not completed comments - - -  Palliative Care Screening - - -  Medication Review Press photographer) Complete Complete Referral to Pharmacy  PCP or Specialist appointment within 3-5 days of discharge - (No Data) Not Complete  PCP/Specialist Appt Not Complete comments - - DC date unknown but pt is established with providers  [place] or Home Care Consult Complete Complete Not Complete  HRI or Home Care Consult Pt Refusal Comments - - SNF resident  SW Recovery Care/Counseling Consult Complete - Complete  Palliative Care Screening Not Applicable Not Applicable Not Complete  Comments - - pending need  [place] Complete Patient Refused  Complete  Some recent data might be hidden

## 2019-07-25 NOTE — Progress Notes (Signed)
   Patient evaluated at bedside.  She has a left heel ulcer which does not appear infected.  At this time I would not recommend any vascular intervention we will let the left heel demarcate she will be high risk for amputation on that side.  We will plan to remove staples in 2 to 3 weeks.  Maleeha Halls C. Donzetta Matters, MD [facility] [place] Office: [phone] Pager: [phone]

## 2019-07-25 NOTE — Progress Notes (Signed)
Per CCMD pt had 20bt run of vtach. Pt asymptomatic.  MD aware.

## 2019-07-25 NOTE — Progress Notes (Signed)
Nurse spoke with pt's spouse Kela Millin regarding plan of care. Pt's spouse requested for doctor to call him  . [phone].  MD paged to make aware.

## 2019-07-26 LAB — CBC WITH DIFFERENTIAL/PLATELET
Abs Immature Granulocytes: 0.04 10*3/uL (ref 0.00–0.07)
Basophils Absolute: 0.1 10*3/uL (ref 0.0–0.1)
Basophils Relative: 1 %
Eosinophils Absolute: 0.3 10*3/uL (ref 0.0–0.5)
Eosinophils Relative: 5 %
HCT: 31.4 % — ABNORMAL LOW (ref 36.0–46.0)
Hemoglobin: 8.7 g/dL — ABNORMAL LOW (ref 12.0–15.0)
Immature Granulocytes: 1 %
Lymphocytes Relative: 12 %
Lymphs Abs: 0.8 10*3/uL (ref 0.7–4.0)
MCH: 27.6 pg (ref 26.0–34.0)
MCHC: 27.7 g/dL — ABNORMAL LOW (ref 30.0–36.0)
MCV: 99.7 fL (ref 80.0–100.0)
Monocytes Absolute: 0.6 10*3/uL (ref 0.1–1.0)
Monocytes Relative: 8 %
Neutro Abs: 5 10*3/uL (ref 1.7–7.7)
Neutrophils Relative %: 73 %
Platelets: 132 10*3/uL — ABNORMAL LOW (ref 150–400)
RBC: 3.15 MIL/uL — ABNORMAL LOW (ref 3.87–5.11)
RDW: 21.8 % — ABNORMAL HIGH (ref 11.5–15.5)
WBC: 6.8 10*3/uL (ref 4.0–10.5)
nRBC: 0 % (ref 0.0–0.2)

## 2019-07-26 LAB — GLUCOSE, CAPILLARY
Glucose-Capillary: 133 mg/dL — ABNORMAL HIGH (ref 70–99)
Glucose-Capillary: 110 mg/dL — ABNORMAL HIGH (ref 70–99)
Glucose-Capillary: 115 mg/dL — ABNORMAL HIGH (ref 70–99)

## 2019-07-26 LAB — COMPREHENSIVE METABOLIC PANEL
ALT: 12 U/L (ref 0–44)
AST: 20 U/L (ref 15–41)
Albumin: 2.5 g/dL — ABNORMAL LOW (ref 3.5–5.0)
Alkaline Phosphatase: 127 U/L — ABNORMAL HIGH (ref 38–126)
Anion gap: 10 (ref 5–15)
BUN: 37 mg/dL — ABNORMAL HIGH (ref 8–23)
CO2: 29 mmol/L (ref 22–32)
Calcium: 8.6 mg/dL — ABNORMAL LOW (ref 8.9–10.3)
Chloride: 103 mmol/L (ref 98–111)
Creatinine, Ser: 1.46 mg/dL — ABNORMAL HIGH (ref 0.44–1.00)
GFR calc Af Amer: 44 mL/min — ABNORMAL LOW (ref >60)
GFR calc non Af Amer: 38 mL/min — ABNORMAL LOW (ref >60)
Glucose, Bld: 132 mg/dL — ABNORMAL HIGH (ref 70–99)
Potassium: 4.1 mmol/L (ref 3.5–5.1)
Sodium: 142 mmol/L (ref 135–145)
Total Bilirubin: 1.5 mg/dL — ABNORMAL HIGH (ref 0.3–1.2)
Total Protein: 9.2 g/dL — ABNORMAL HIGH (ref 6.5–8.1)

## 2019-07-26 LAB — SARS CORONAVIRUS 2 BY RT PCR (HOSPITAL ORDER, PERFORMED IN ~~LOC~~ HOSPITAL LAB): SARS Coronavirus 2: NEGATIVE

## 2019-07-26 LAB — MAGNESIUM: Magnesium: 1.7 mg/dL (ref 1.7–2.4)

## 2019-07-26 MED ORDER — PRO-STAT SUGAR FREE PO LIQD: 30.0000 mL | Freq: Three times a day (TID) | ORAL | 0 refills | Status: AC

## 2019-07-26 MED ORDER — FUROSEMIDE 40 MG PO TABS: 40.0000 mg | ORAL_TABLET | Freq: Every day | ORAL | Status: AC

## 2019-07-26 MED ORDER — LACTULOSE 10 GM/15ML PO SOLN: 20.0000 g | Freq: Two times a day (BID) | ORAL | 0 refills | Status: AC

## 2019-07-26 NOTE — Progress Notes (Signed)
Occupational Therapy Treatment Patient Details Name: [name] MRN: [id number] DOB: 1955-01-24 Today's Date: 07/26/2019    History of present illness Pt is a 64 y.o. female admitted from [facility]F on 06/[facility]/19 with AMS. 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 Unable to be extubated following surgery. CCM was consulted for ventilator management. Extubated 6/22. Pt with hepatic encephalopathy secondary to severe sepsis. CXR 7/1 with mild bibasilar atelectasis or edema with probable small pleural effusions. PMH includes CHF with anasarca, NSTEMI, COPD, DM 2, anemia, CKD IIIB, hepatitis C with cirrhosis.   OT comments  Session with focus on bed level BADLs, bed mobility, and static/dynamic sitting balance at EOB. Patient continues to follow 1-step verbal commands intermittently with <25% accuracy. Patient able to maintain static sitting balance at EOB for 10-12 minutes during AAROM/PROM of BUE. Patient continues to be limited by varying levels of responsiveness, decreased strength, increased need for assistance from others, and decreased activity tolerance. Attempted to transfer patient via Maxi lift to recliner but unable to locate lift pad. Nursing staff aware. Patient would benefit from continued acute OT services to maximize independence with UB BADLs and decrease caregiver burden.    Follow Up Recommendations  SNF    Equipment Recommendations  Other (comment) (Defer to next level of care)    Recommendations for Other Services      Precautions / Restrictions Precautions Precautions: Fall;Other (comment) Precaution Comments: right AKA Restrictions Weight Bearing Restrictions: No       Mobility Bed Mobility Overal bed mobility: Needs Assistance Bed Mobility: Rolling Rolling: Max assist;+2 for safety/equipment Sidelying to sit: Max assist;+2 for physical assistance   Sit to supine: +2 for physical assistance;Total assist    General bed mobility comments: Max A to roll R<>L for toileting hygiene. Patient utilized bed rail with cues for sequencing.   Transfers                      Balance Overall balance assessment: Needs assistance Sitting-balance support: Bilateral upper extremity supported Sitting balance-Leahy Scale: Fair Sitting balance - Comments: Patient sat EOB for ~10-12 minutes with occasional Min guard. Patient able to bear weight on R/L elbow to scratch backside but unable to do so with verbal commands.                                    ADL either performed or assessed with clinical judgement   ADL       Grooming: Wash/dry face;Maximal assistance;Bed level;Oral care;Moderate assistance Grooming Details (indicate cue type and reason): Max A to wash face/hands seated EOB with hand over hand assist.  Upper Body Bathing: Total assistance Upper Body Bathing Details (indicate cue type and reason): Bed level +2 Lower Body Bathing: Total assistance Lower Body Bathing Details (indicate cue type and reason): Bed level +2  Upper Body Dressing : Total assistance   Lower Body Dressing: Total assistance Lower Body Dressing Details (indicate cue type and reason): L sock donned with patient seated EOB. Patient unable to maintain position of LLE in space against gravity.                      Vision       Perception     Praxis      Cognition Arousal/Alertness: Lethargic Behavior During Therapy: Flat affect Overall Cognitive Status: No  family/caregiver present to determine baseline cognitive functioning Area of Impairment: Attention;Following commands;Problem solving                 Orientation Level: Place;Time;Situation;Person Current Attention Level: Focused;Sustained Memory: Decreased short-term memory Following Commands: Follows one step commands inconsistently;Follows one step commands with increased time     Problem Solving: Slow processing;Requires  verbal cues;Decreased initiation;Difficulty sequencing;Requires tactile cues          Exercises Exercises: General Upper Extremity General Exercises - Upper Extremity Shoulder Flexion: PROM;10 reps;Seated Shoulder Extension: PROM;10 reps;Seated Shoulder ABduction: PROM;10 reps Shoulder ADduction: PROM;10 reps;Seated Elbow Flexion: AAROM;10 reps;Seated Elbow Extension: AAROM;10 reps;Seated   Shoulder Instructions       General Comments      Pertinent Vitals/ Pain       Pain Assessment: Faces Faces Pain Scale: No hurt Pain Intervention(s): Monitored during session  Home Living                                          Prior Functioning/Environment              Frequency  Min 2X/week        Progress Toward Goals  OT Goals(current goals can now be found in the care plan section)  Progress towards OT goals: Progressing toward goals  Acute Rehab OT Goals Patient Stated Goal: No goals stated OT Goal Formulation: With patient Time For Goal Achievement: 08/06/19 Potential to Achieve Goals: Good ADL Goals Pt Will Perform Eating: with set-up;sitting;bed level Pt Will Perform Grooming: with mod assist;bed level Pt/caregiver will Perform Home Exercise Program: Increased strength;Both right and left upper extremity Additional ADL Goal #1: Pt will consistently follow 1-2 step commands in order to increase carry over of skills from session to session. Additional ADL Goal #2: Pt. will tolerate EOB x15 minutes with Min guard for stability in prep for ADLs.  Plan Discharge plan remains appropriate    Co-evaluation                 AM-PAC OT "6 Clicks" Daily Activity     Outcome Measure   Help from another person eating meals?: Total Help from another person taking care of personal grooming?: A Little Help from another person toileting, which includes using toliet, bedpan, or urinal?: Total Help from another person bathing (including washing,  rinsing, drying)?: A Lot Help from another person to put on and taking off regular upper body clothing?: A Lot Help from another person to put on and taking off regular lower body clothing?: Total 6 Click Score: 10    End of Session    OT Visit Diagnosis: Unsteadiness on feet (R26.81);Muscle weakness (generalized) (M62.81)   Activity Tolerance Patient limited by fatigue   Patient Left in bed;with call bell/phone within reach;with bed alarm set   Nurse Communication          Time: 5726-2035 OT Time Calculation (min): 30 min  Charges: OT General Charges $OT Visit: 1 Visit OT Treatments $Self Care/Home Management : 8-22 mins $Therapeutic Exercise: 8-22 mins  Jerret Mcbane H. OTR/L Supplemental OT, Department of rehab services [phone]   Zac Torti R H. 07/26/2019, 10:58 AM

## 2019-07-26 NOTE — Progress Notes (Signed)
Report was given to Byron, [place].

## 2019-07-26 NOTE — Discharge Summary (Signed)
. Physician Discharge Summary  DAISA STENNIS QBV:[id number] DOB: May 09, 1955 DOA: 06/27/2019  PCP: [place] date: 06/27/2019 Discharge date: 07/26/2019  Admitted From: [facility] Disposition:  Discharge to [facility]  Recommendations for Outpatient Follow-up:  1. Follow up with PCP in 1 week 2. Please obtain BMP/CBC in one week  Discharge Condition: Stable  CODE STATUS: DNR   Brief/Interim Summary: 64 year old chronically ill female resident of [facility] with multiple medical problems including CAD, COPD, CKD stage IIIb, chronic diastolic CHF, hep C, cirrhosis was admitted with sepsis, encephalopathy and extensive full-thickness tissue loss of right foot. MRI indicated right foot abscess, myositis and tenosynovitis as well as left foot myositis and cellulitis. 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 She was unable to be extubated postop and was admitted to the ICU on 07/04/19.  7/9: Medically stable for discharge. COVID screen negative. Will discharge to [facility].   Discharge Diagnoses:  Active Problems:   Acute hypoxemic respiratory failure (HCC)   NSTEMI (non-ST elevated myocardial infarction) (HCC)   COPD (chronic obstructive pulmonary disease) (HCC)   Anxiety   CAD (coronary artery disease)   Chronic back pain   Chronic hepatitis C without hepatic coma (HCC)   Diabetes mellitus type 2, uncontrolled (HCC)   Hypertension   Sleep apnea   Other cirrhosis of liver (HCC)   Acute metabolic encephalopathy   Type 2 diabetes mellitus with stage 3b chronic kidney disease, with long-term current use of insulin (HCC)   Chronic systolic CHF (congestive heart failure) (HCC)   Venous stasis ulcer ([place])   Advanced care planning/counseling discussion   Respiratory acidosis   Hyperkalemia   Palliative care encounter   DNR (do not resuscitate)  Severe sepsis: Present on admission Right foot abscess, myositis - 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 Afebrile. WBC wnl. She is good for discharge   Acute toxic and metabolic encephalopathy - Secondary to severe sepsis, hepatic encephalopathy - Also combination of ICU delirium - Very slow improvement; but fluctuating mental status - Patient was slightly more lethargic on 07/15/2019 with ammonia level of 57: Lactulose dose was increased. Monitor mental status. - Fall precautions. PT/OT recommends SNF placement - SLP has eval'd. Tube feeds discontinued and orals started - Dietician following; appreciate assistance. Encourage diet.      - 7/9: She continues to be alert to name, place; following commands. Has a spot at [facility] for today. Will discharge.  Hemorrhagic shock Postop bleeding - Appears to have resolved. Status post 7 units packed red cells transfusion during this hospitalization.  - Received IV iron on 07/14/2019     - 7/8: Hgb stable at 8.6. no signs of bleed. Follow.   Acute on chronic hypoxic respiratory failure Acute on chronic diastolic heart failure COPD - Mechanical ventilation from 07/04/2019-07/09/2019 - Continue BiPAP nightly and daytime during naps - Diuretics were briefly held for few days giving worsening sodium went overnight on 07/16/2019, patient was more hypoxic with chest x-ray showing fluid overload and subsequently intravenous Lasix was restarted. Strict input and output. Daily weights. Fluid restriction.  - Chest x-ray from 07/18/2019 did show mild bibasilar atelectasis or edema with probable small pleural effusions. Lasix dose was increased to 80 mg IV every 12 hours on 07/18/2019. Lasix discontinued on 07/20/2019 because of sodium reaching 150. -  Currently requiring 2-3 L oxygen via nasal cannula; continue lasix 76m PO  qday     - 7/9: Remains on RA and comfortable during interview.  Acute kidney injury on chronic kidney disease stage IIIb - 7/9: Scr is down to 1.46 today. She is stable.  Hypomagnesemia - resolved  Hepatic encephalopathy Hepatitis C Liver cirrhosis - Continue lactulose; patient intermittently refuses lactulose.  - Lasix as above. Overall poor prognosis.  Diabetes mellitus type 2 - Continue CBGs with SSI.     - sliding scale: insulin aspart (novoLOG) injection 0-15 Units 0-15 Units, Subcutaneous, 3 times daily with meals CBG < 70: Implement Hypoglycemia Standing Orders and refer to Hypoglycemia Standing Orders sidebar report CBG 70 - 120: 0 units CBG 121 - 150: 2 units CBG 151 - 200: 3 units CBG 201 - 250: 5 units CBG 251 - 300: 8 units CBG 301 - 350: 11 units CBG 351 - 400: 15 units CBG > 400: call MD and obtain STAT lab verification  Generalized deconditioning - Overall prognosis is very poor because of multiple serious medical problems including vascular disease/CAD//COPD/OSA/chronic renal disease stage IIIb/liver cirrhosis/multiple frequent hospitalizations - Palliative care has evaluated the patient and has had family discussion during the hospitalization. Recommend outpatient palliative care follow-up. - If condition worsens, consider hospice/comfort measures - will need SNF placement. Not a candidate for LTACH as per social worker     - 7[facility]9: [facility]   Discharge Instructions   Allergies as of 07/26/2019      Reactions   Latex Anaphylaxis, Rash   Gabapentin Nausea And Vomiting, Other (See Comments)      Medication List    STOP taking these medications   collagenase ointment Commonly known as: SANTYL   dronabinol 2.5 MG capsule Commonly known as: MARINOL   ferrous sulfate 325 (65 FE) MG tablet   hydrocerin Crea   HYDROcodone-acetaminophen 5-325 MG tablet Commonly known as: NORCO/VICODIN    mirtazapine 30 MG tablet Commonly known as: REMERON   pregabalin 75 MG capsule Commonly known as: LYRICA     TAKE these medications   atorvastatin 40 MG tablet Commonly known as: LIPITOR Take 40 mg by mouth daily.   CERTAVITE SENIOR PO Take 1 tablet by mouth daily.   feeding supplement (PRO-STAT SUGAR FREE 64) Liqd Take 30 mLs by mouth 3 (three) times daily. What changed: when to take this   fluticasone 50 MCG/ACT nasal spray Commonly known as: FLONASE Place 2 sprays into both nostrils daily.   folic acid 1 MG tablet Commonly known as: FOLVITE Take 1 tablet (1 mg total) by mouth daily.   furosemide 40 MG tablet Commonly known as: LASIX Take 1 tablet (40 mg total) by mouth daily. Start taking on: July 27, 2019 What changed:   medication strength  how much to take   lactulose 10 GM/15ML solution Commonly known as: CHRONULAC Take 30 mLs (20 g total) by mouth 2 (two) times daily. What changed: when to take this   multivitamin with minerals Tabs tablet Take 1 tablet by mouth daily.   RESOURCE 2.0 PO Take 1 Bottle by mouth 2 (two) times daily.       Allergies  Allergen Reactions  . Latex Anaphylaxis and Rash  . Gabapentin Nausea And Vomiting and Other (See Comments)    Consultations:  PCCM  Vascular Surgery  RAKA  Wound Care: Pressure Injury 06/27/19 Heel Left Deep Tissue Pressure Injury - Purple or maroon localized area of discolored  intact skin or blood-filled blister due to damage of underlying soft tissue from pressure and/or shear. (Active)  06/27/19 2040  Location: Heel  Location Orientation: Left  Staging: Deep Tissue Pressure Injury - Purple or maroon localized area of discolored intact skin or blood-filled blister due to damage of underlying soft tissue from pressure and/or shear.  Wound Description (Comments):   Present on Admission:      Pressure Injury 07/03/19 Thigh Left;Medial Stage 2 -  Partial thickness loss of dermis presenting as  a shallow open injury with a red, pink wound bed without slough. Medical Device (Active)  07/03/19 1136  Location: Thigh  Location Orientation: Left;Medial  Staging: Stage 2 -  Partial thickness loss of dermis presenting as a shallow open injury with a red, pink wound bed without slough.  Wound Description (Comments): Medical Device  Present on Admission: No     Pressure Injury 07/03/19 Thigh Right;Medial Stage 2 -  Partial thickness loss of dermis presenting as a shallow open injury with a red, pink wound bed without slough. medical device (Active)  07/03/19 1137  Location: Thigh  Location Orientation: Right;Medial  Staging: Stage 2 -  Partial thickness loss of dermis presenting as a shallow open injury with a red, pink wound bed without slough.  Wound Description (Comments): medical device  Present on Admission: No    Procedures/Studies: CT HEAD WO CONTRAST  Result Date: 07/04/2019 CLINICAL DATA:  Encephalopathy. EXAM: CT HEAD WITHOUT CONTRAST TECHNIQUE: Contiguous axial images were obtained from the base of the skull through the vertex without intravenous contrast. COMPARISON:  06/27/2019.  06/30/2019. FINDINGS: Brain: No sign of acute infarction. Chronic small-vessel ischemic changes of the cerebral hemispheric white matter. No mass lesion, hemorrhage, hydrocephalus or extra-axial collection. Vascular: There is atherosclerotic calcification of the major vessels at the base of the brain. Skull: Negative Sinuses/Orbits: Clear/normal Other: None IMPRESSION: No acute finding. Chronic small-vessel ischemic changes of the hemispheric white matter as seen previously. Electronically Signed   By: Nelson Chimes M.D.   On: 07/04/2019 20:37   CT Head Wo Contrast  Result Date: 06/27/2019 CLINICAL DATA:  Altered mental status EXAM: CT HEAD WITHOUT CONTRAST TECHNIQUE: Contiguous axial images were obtained from the base of the skull through the vertex without intravenous contrast. COMPARISON:  11/20/2018  FINDINGS: Brain: There is atrophy and chronic small vessel disease changes. Old bilateral basal ganglia lacunar infarcts. No acute intracranial abnormality. Specifically, no hemorrhage, hydrocephalus, mass lesion, acute infarction, or significant intracranial injury. Vascular: No hyperdense vessel or unexpected calcification. Skull: No acute calvarial abnormality. Sinuses/Orbits: Visualized paranasal sinuses and mastoids clear. Orbital soft tissues unremarkable. Other: None IMPRESSION: Old bilateral basal ganglia lacunar infarcts. Atrophy, chronic microvascular disease. No acute intracranial abnormality. Electronically Signed   By: Rolm Baptise M.D.   On: 06/27/2019 09:21   MR BRAIN WO CONTRAST  Result Date: 06/30/2019 CLINICAL DATA:  Encephalopathy EXAM: MRI HEAD WITHOUT CONTRAST TECHNIQUE: Multiplanar, multiecho pulse sequences of the brain and surrounding structures were obtained without intravenous contrast. COMPARISON:  None. FINDINGS: Brain: No acute infarct, acute hemorrhage or extra-axial collection. Early confluent hyperintense T2-weighted signal of the periventricular and deep white matter, most commonly due to chronic ischemic microangiopathy. Normal volume of CSF spaces. No chronic microhemorrhage. Normal midline structures. Vascular: Normal flow voids. Skull and upper cervical spine: Normal marrow signal. Sinuses/Orbits: Small amount of mastoid fluid bilaterally. No paranasal sinus fluid level. Orbits are normal. Other: None IMPRESSION: 1. No acute intracranial abnormality. 2. Findings of chronic ischemic microangiopathy. Electronically  Signed   By: Ulyses Jarred M.D.   On: 06/30/2019 19:25   MR TIBIA FIBULA RIGHT WO CONTRAST  Result Date: 06/27/2019 CLINICAL DATA:  Right lower extremity swelling with multiple wounds EXAM: MRI OF LOWER RIGHT EXTREMITY WITHOUT CONTRAST TECHNIQUE: Multiplanar, multisequence MR imaging of the right tibia and fibula was performed. No intravenous contrast was  administered. Field of view is from the level of the knee joint to the level of the distal tibial diaphysis. COMPARISON:  X-ray 05/26/2019 FINDINGS: Bones/Joint/Cartilage No acute fracture. No malalignment. Susceptibility artifact from distal fibular fixation hardware is partially included the inferior margin of the field of view. No bone marrow edema. There is preservation of the fatty T1 bone marrow signal. No cortical destruction or periostitis. Ligaments Grossly intact. Muscles and Tendons Mild fatty infiltration of the lower leg musculature. Mild diffuse edema like intramuscular signal. No intramuscular fluid collection. Included tendinous structures grossly intact. Soft tissues Circumferential subcutaneous edema throughout the lower leg. No well-defined soft tissue fluid collection. No deep skin ulceration. There is mild deep fascial edema, not out of proportion to the degree of soft tissue edema. No deep fascial fluid collection. A small Baker's cyst is incidentally noted. IMPRESSION: 1. Nonspecific circumferential subcutaneous edema throughout the right lower leg. No well-defined soft tissue fluid collection. 2. No acute osseous abnormality or evidence of osteomyelitis. 3. Mild diffuse intramuscular edema, which may reflect a nonspecific myositis versus denervation changes. Electronically Signed   By: Davina Poke D.O.   On: 06/27/2019 19:33   MR FOOT RIGHT WO CONTRAST  Result Date: 06/27/2019 CLINICAL DATA:  Right foot wound EXAM: MRI OF THE RIGHT FOREFOOT WITHOUT CONTRAST TECHNIQUE: Multiplanar, multisequence MR imaging of the right forefoot was performed. No intravenous contrast was administered. COMPARISON:  MRI 06/01/2019 FINDINGS: Bones/Joint/Cartilage No acute fracture. No dislocation. No bone marrow edema. There is preservation of the fatty T1 bone marrow signal throughout the right forefoot. No cortical destruction. Similar mild degenerative changes. No large joint effusion. Ligaments Intact  Lisfranc ligament. Collateral ligaments of the forefoot appear intact. Muscles and Tendons Diffuse edema like signal throughout the intrinsic foot musculature suggesting a nonspecific myositis. Small volume tenosynovial fluid within the extensor digitorum longus tendon sheath at the level of the midfoot. Flexor tendons appear unremarkable. Soft tissues Marked dorsal soft tissue swelling and subcutaneous edema with multiple areas of dorsal skin irregularity suggesting wounds or ulcerations. Slightly ill-defined multilobulated fluid collection at the dorsal aspect of the proximal forefoot laterally measuring approximately 4.8 x 1.0 x 7.4 cm (series 5, image 36; series 6, image 14). Numerous additional lobulated areas of fluid are seen more proximally within the midfoot as well as the medial hindfoot (series 5, images 44 and 51). There is intermetatarsal bursal fluid within the first and second intermetatarsal spaces. IMPRESSION: 1. No evidence of acute osteomyelitis of the right forefoot. 2. Marked dorsal soft tissue swelling and subcutaneous edema with multiple areas of dorsal skin irregularity suggesting wounds or ulcerations. Slightly ill-defined multilobulated fluid collection at the dorsal aspect of the proximal forefoot laterally measuring 4.8 x 1.0 x 7.4 cm. Numerous additional lobulated areas of fluid are seen more proximally within the dorsal midfoot as well as the medial hindfoot. Findings are concerning for developing abscesses. 3. Diffuse edema like signal throughout the intrinsic foot musculature suggesting a nonspecific myositis. 4. Mild extensor digitorum longus tenosynovitis. Electronically Signed   By: Davina Poke D.O.   On: 06/27/2019 19:10   MR TIBIA FIBULA LEFT WO CONTRAST  Result  Date: 06/28/2019 CLINICAL DATA:  Left lower leg swelling with multiple wounds present. EXAM: MRI OF LOWER LEFT EXTREMITY WITHOUT CONTRAST TECHNIQUE: Multiplanar, multisequence MR imaging of the left lower leg was  performed. No intravenous contrast was administered. COMPARISON:  None. FINDINGS: Bones/Joint/Cartilage Four sequences are provided as the patient could not tolerate further scanning. There is some motion on the study. No marrow signal abnormality suggest osteomyelitis. No fracture or focal lesion. Ligaments Negative. Muscles and Tendons No intramuscular fluid collection is seen. Lower leg musculature is atrophic. Soft tissues Diffuse subcutaneous edema is present. No focal fluid collection is identified although evaluation for abscess is limited on this study. IMPRESSION: Negative for osteomyelitis or myositis. Diffuse subcutaneous edema about the lower leg compatible with cellulitis or dependent change. No subcutaneous abscess is identified but evaluation is limited due to motion and early termination of the exam. Electronically Signed   By: Inge Rise M.D.   On: 06/28/2019 11:31   MR FOOT LEFT WO CONTRAST  Result Date: 06/27/2019 CLINICAL DATA:  Left foot ulceration EXAM: MRI OF THE LEFT FOOT WITHOUT CONTRAST TECHNIQUE: Multiplanar, multisequence MR imaging of the left forefoot was performed. No intravenous contrast was administered. COMPARISON:  X-ray 06/21/2018 FINDINGS: Technical note: Significantly limited exam. The obtained sequences are significantly motion degraded. No short axis fluid sensitive sequence was obtained. No sagittal sequence was obtained. 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 Subtle edema within the first metatarsal head with preservation of the fatty T1 bone marrow signal. The remaining included osseous structures appear to have preserved T1 marrow signal. No evidence of cortical destruction. No acute fracture identified. No dislocation. Ligaments Grossly intact. Muscles and Tendons Diffuse edema like intramuscular signal suggesting a nonspecific myositis. No appreciable tenosynovial fluid collection on the included images. Soft  tissues No evidence of a large soft tissue ulceration. No appreciable fluid collection. There is mild soft tissue swelling. IMPRESSION: 1. Significantly limited, motion degraded exam. Patient was unable to complete the full examination. 2. 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 Subtle edema within the first metatarsal head with preservation of the fatty T1 bone marrow signal. Findings are favored to represent reactive osteitis. Early acute osteomyelitis is felt to be unlikely but would be difficult to entirely exclude on the provided images. 3. Diffuse edema-like intramuscular signal suggesting a nonspecific myositis. Electronically Signed   By: Davina Poke D.O.   On: 06/27/2019 19:17   DG CHEST PORT 1 VIEW  Result Date: 07/18/2019 CLINICAL DATA:  Dyspnea. EXAM: PORTABLE CHEST 1 VIEW COMPARISON:  July 15, 2019. FINDINGS: Stable cardiomegaly. Status post aortic valve repair. No pneumothorax is noted. Mild bibasilar atelectasis or edema is noted with probable small pleural effusions, left greater than right. Bony thorax is unremarkable. IMPRESSION: Mild bibasilar atelectasis or edema is noted with probable small pleural effusions, left greater than right. Electronically Signed   By: Marijo Conception M.D.   On: 07/18/2019 10:02   DG CHEST PORT 1 VIEW  Result Date: 07/15/2019 CLINICAL DATA:  Altered mental status.  Chest pain. EXAM: PORTABLE CHEST 1 VIEW COMPARISON:  07/07/2019 FINDINGS: Redemonstration of cardiomegaly. Previous CABG and aortic valve replacement. Pulmonary edema with bilateral effusions. Findings similar to the study of 8 days ago. No new abnormality seen. IMPRESSION: Persistent congestive heart failure pattern. Similar appearance with edema and effusions. Electronically Signed   By: Nelson Chimes M.D.   On: 07/15/2019 19:01   DG CHEST PORT  1 VIEW  Result Date: 07/07/2019 CLINICAL DATA:  Hypoxia EXAM: PORTABLE CHEST 1 VIEW COMPARISON:  July 05, 2019 FINDINGS:  Stable cardiomegaly and mild edema. The feeding tube terminates below today's film. No pneumothorax. A layering effusion on the right with underlying atelectasis is stable. Opacity on the left is also similar in the interval. No other interval changes. IMPRESSION: 1. Findings are consistent with cardiomegaly and mild edema. 2. The layering effusion with underlying atelectasis on the right is stable. 3. Opacity in the left base could represent a small effusion with atelectasis. Infiltrate not completely excluded. Recommend attention on follow-up. Electronically Signed   By: Dorise Bullion III M.D   On: 07/07/2019 12:46   DG CHEST PORT 1 VIEW  Result Date: 07/05/2019 CLINICAL DATA:  Respiratory failure EXAM: PORTABLE CHEST 1 VIEW COMPARISON:  07/04/2019 FINDINGS: Endotracheal tube terminates approximately 5.5 cm above the carina. Interval placement of enteric tube which is coiled within the gastric fundus. Prior median sternotomy and cardiac valve replacement. Stable cardiomegaly. Persistent bilateral pleural effusions with hazy bibasilar opacities, similar to slightly progressed from prior. No pneumothorax. IMPRESSION: 1. Persistent bilateral pleural effusions with hazy bibasilar opacities, similar to slightly progressed from prior. 2. Interval placement of enteric tube which terminates within the gastric fundus. Electronically Signed   By: Davina Poke D.O.   On: 07/05/2019 08:03   DG CHEST PORT 1 VIEW  Result Date: 07/04/2019 CLINICAL DATA:  Intubated EXAM: PORTABLE CHEST 1 VIEW COMPARISON:  06/30/2019 FINDINGS: Endotracheal tube tip is 5-6 cm above the carina. There is cardiomegaly. Previous aortic valve replacement. Mild pulmonary edema persists with bilateral effusions and atelectasis in the lower lungs. IMPRESSION: Endotracheal tube tip 5-6 cm above the carina. Persistent mild edema with bilateral effusions and lower lobe atelectasis. Electronically Signed   By: Nelson Chimes M.D.   On: 07/04/2019  20:20   DG CHEST PORT 1 VIEW  Result Date: 06/30/2019 CLINICAL DATA:  Dyspnea EXAM: PORTABLE CHEST 1 VIEW COMPARISON:  06/28/2019 FINDINGS: Cardiac shadow remains enlarged. Postsurgical changes are again seen. Increasing vascular congestion is noted when compare with the prior exam with increasing edema. Some patchy airspace opacity is noted in the right base consistent with atelectasis. No bony abnormality is noted. IMPRESSION: Increasing CHF with right basilar atelectasis. Electronically Signed   By: Inez Catalina M.D.   On: 06/30/2019 21:32   Portable chest 1 View  Result Date: 06/28/2019 CLINICAL DATA:  Pulmonary edema EXAM: PORTABLE CHEST 1 VIEW COMPARISON:  Yesterday FINDINGS: Cardiomegaly. Aortic valve replacement and CABG. Hazy bilateral chest, worse on the left, attributed to layering pleural fluid and presumed atelectasis. No superimposed Kerley lines clearly seen. No pneumothorax IMPRESSION: No progression of the hazy left more than right chest usually from atelectasis and layering effusions. Electronically Signed   By: Monte Fantasia M.D.   On: 06/28/2019 04:55   DG Chest Portable 1 View  Result Date: 06/27/2019 CLINICAL DATA:  Hypoxia. EXAM: PORTABLE CHEST 1 VIEW COMPARISON:  05/26/2019 FINDINGS: Cardiomegaly. 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 Diffuse hazy appearance of the lungs with pleural fluid and vascular congestion. No visible air leak. IMPRESSION: Low volume chest with hazy opacity from pleural fluid and presumed atelectasis. Suspect CHF. Electronically Signed   By: Monte Fantasia M.D.   On: 06/27/2019 06:09   DG Abd Portable 1V  Result Date: 07/04/2019 CLINICAL DATA:  OG tube placement EXAM: PORTABLE ABDOMEN - 1 VIEW COMPARISON:  11/23/2018 FINDINGS: OG tube coils in the stomach. Mild  gaseous distention of stomach Peri layering bilateral effusions with bibasilar opacities. IMPRESSION: OG tube coils in the stomach. Electronically Signed   By: Rolm Baptise M.D.    On: 07/04/2019 19:12   ECHOCARDIOGRAM COMPLETE  Result Date: 06/27/2019    ECHOCARDIOGRAM REPORT   Patient Name:   SHAMECA LANDEN Date of Exam: 06/27/2019 Medical Rec #:  [id number]         Height:       65.0 in Accession #:    [id number]        Weight:       253.5 lb Date of Birth:  01/23/1955         BSA:          2.188 m Patient Age:    62 years          BP:           119/100 mmHg Patient Gender: F                 HR:           85 bpm. Exam Location:  Inpatient Procedure: 2D Echo, Cardiac Doppler and Color Doppler Indications:    CHF-Acute Systolic 389.37 / D42.87  History:        Patient has prior history of Echocardiogram examinations, most                 recent 05/30/2019. CHF and Cardiomyopathy, CAD, COPD,                 Arrythmias:non-specific ST changes; Risk Factors:Hypertension,                 Diabetes, Sleep Apnea and Current Smoker. GERD.  Sonographer:    Vickie Epley RDCS Referring Phys: [id number] Reynolds  1. Septal dyskinesis consistent with RBBB. Left ventricular ejection fraction, by estimation, is 40 to 45%. The left ventricle has mildly decreased function. The left ventricle has no regional wall motion abnormalities. Left ventricular diastolic parameters are consistent with Grade II diastolic dysfunction (pseudonormalization). Elevated left ventricular end-diastolic pressure.  2. Right ventricular systolic function is moderately reduced. The right ventricular size is normal. There is mildly elevated pulmonary artery systolic pressure.  3. The mitral valve is normal in structure. Mild mitral valve regurgitation. No evidence of mitral stenosis.  4. The aortic valve is tricuspid. Aortic valve regurgitation is not visualized. No aortic stenosis is present.  5. The inferior vena cava is dilated in size with <50% respiratory variability, suggesting right atrial pressure of 15 mmHg. FINDINGS  Left Ventricle: Septal dyskinesis consistent with RBBB. Left ventricular ejection  fraction, by estimation, is 40 to 45%. The left ventricle has mildly decreased function. The left ventricle has no regional wall motion abnormalities. The left ventricular  internal cavity size was normal in size. There is no left ventricular hypertrophy. Left ventricular diastolic parameters are consistent with Grade II diastolic dysfunction (pseudonormalization). Elevated left ventricular end-diastolic pressure. Right Ventricle: The right ventricular size is normal. No increase in right ventricular wall thickness. Right ventricular systolic function is moderately reduced. There is mildly elevated pulmonary artery systolic pressure. The tricuspid regurgitant velocity is 2.70 m/s, and with an assumed right atrial pressure of 15 mmHg, the estimated right ventricular systolic pressure is 62.0 mmHg. Left Atrium: Left atrial size was normal in size. Right Atrium: Right atrial size was normal in size. Pericardium: There is no evidence of pericardial effusion. Mitral Valve: The mitral valve is normal in structure.  Normal mobility of the mitral valve leaflets. Moderate mitral annular calcification. Mild mitral valve regurgitation. No evidence of mitral valve stenosis. Tricuspid Valve: The tricuspid valve is normal in structure. Tricuspid valve regurgitation is mild . No evidence of tricuspid stenosis. Aortic Valve: The aortic valve is tricuspid. Aortic valve regurgitation is not visualized. No aortic stenosis is present. Pulmonic Valve: The pulmonic valve was normal in structure. Pulmonic valve regurgitation is not visualized. No evidence of pulmonic stenosis. Aorta: The aortic root is normal in size and structure. Venous: The inferior vena cava is dilated in size with less than 50% respiratory variability, suggesting right atrial pressure of 15 mmHg. IAS/Shunts: There is left bowing of the interatrial septum, suggestive of elevated right atrial pressure. No atrial level shunt detected by color flow Doppler.  LEFT  VENTRICLE PLAX 2D LVIDd:         5.00 cm      Diastology LVIDs:         3.90 cm      LV e' lateral:   5.59 cm/s LV PW:         1.00 cm      LV E/e' lateral: 25.2 LV IVS:        1.00 cm      LV e' medial:    3.73 cm/s LVOT diam:     1.90 cm      LV E/e' medial:  37.8 LV SV:         51 LV SV Index:   23 LVOT Area:     2.84 cm  LV Volumes (MOD) LV vol d, MOD A2C: 139.0 ml LV vol d, MOD A4C: 140.0 ml LV vol s, MOD A2C: 78.4 ml LV vol s, MOD A4C: 94.8 ml LV SV MOD A2C:     60.6 ml LV SV MOD A4C:     140.0 ml LV SV MOD BP:      54.3 ml RIGHT VENTRICLE RV S prime:     3.49 cm/s TAPSE (M-mode): 1.1 cm LEFT ATRIUM             Index       RIGHT ATRIUM           Index LA diam:        4.50 cm 2.06 cm/m  RA Area:     19.80 cm LA Vol (A2C):   62.3 ml 28.47 ml/m RA Volume:   57.70 ml  26.37 ml/m LA Vol (A4C):   47.6 ml 21.76 ml/m LA Biplane Vol: 57.8 ml 26.42 ml/m  AORTIC VALVE LVOT Vmax:   98.60 cm/s LVOT Vmean:  65.400 cm/s LVOT VTI:    0.180 m  AORTA Ao Root diam: 2.90 cm MITRAL VALVE                TRICUSPID VALVE MV Area (PHT): 4.57 cm     TR Peak grad:   29.2 mmHg MV Decel Time: 166 msec     TR Mean grad:   20.0 mmHg MR Peak grad: 110.7 mmHg    TR Vmax:        270.00 cm/s MR Mean grad: 70.0 mmHg     TR Vmean:       209.0 cm/s MR Vmax:      526.00 cm/s MR Vmean:     389.0 cm/s    SHUNTS MV E velocity: 141.00 cm/s  Systemic VTI:  0.18 m MV A velocity: 72.80 cm/s   Systemic Diam: 1.90 cm MV E/A ratio:  1.94 Skeet Latch MD Electronically signed by Skeet Latch MD Signature Date/Time: 06/27/2019/3:12:26 PM    Final     Subjective: "That'll be fine."  Discharge Exam: Vitals:   07/26/19 0417 07/26/19 0852  BP: (!) 156/82 (!) 149/71  Pulse: 86 94  Resp: 19 16  Temp: 98.9 F (37.2 C) (!) 97.5 F (36.4 C)  SpO2: 96% (!) 87%   Vitals:   07/25/19 2015 07/26/19 0417 07/26/19 0420 07/26/19 0852  BP: 139/67 (!) 156/82  (!) 149/71  Pulse: 87 86  94  Resp: 17 19  16   Temp: 98.8 F (37.1 C) 98.9 F (37.2  C)  (!) 97.5 F (36.4 C)  TempSrc: Oral Oral  Oral  SpO2: 98% 96%  (!) 87%  Weight:   83 kg   Height:        General: 64 y.o. female resting in bed in NAD Cardiovascular: RRR, +S1, S2, no m/g/r, equal pulses throughout Respiratory: CTABL, no w/r/r, normal WOB GI: BS+, NDNT, soft MSK: No c/c; RAKA, left great toe amputation Neuro: alert to name and place; follows commands Psyc: Appropriate interaction but flat affect, calm/cooperative   The results of significant diagnostics from this hospitalization (including imaging, microbiology, ancillary and laboratory) are listed below for reference.     Microbiology: Recent Results (from the past 240 hour(s))  SARS Coronavirus 2 by RT PCR (hospital order, performed in [facility] hospital lab) Nasopharyngeal Nasopharyngeal Swab     Status: None   Collection Time: 07/23/19 12:46 PM   Specimen: Nasopharyngeal Swab  Result Value Ref Range Status   SARS Coronavirus 2 NEGATIVE NEGATIVE Final    Comment: (NOTE) SARS-CoV-2 target nucleic acids are NOT DETECTED.  The SARS-CoV-2 RNA is generally detectable in upper and lower respiratory specimens during the acute phase of infection. The lowest concentration of SARS-CoV-2 viral copies this assay can detect is 250 copies / mL. A negative result does not preclude SARS-CoV-2 infection and should not be used as the sole basis for treatment or other patient management decisions.  A negative result may occur with improper specimen collection / handling, submission of specimen other than nasopharyngeal swab, presence of viral mutation(s) within the areas targeted by this assay, and inadequate number of viral copies (<250 copies / mL). A negative result must be combined with clinical observations, patient history, and epidemiological information.  Fact Sheet for Patients:   StrictlyIdeas.no  Fact Sheet for Healthcare  Providers: BankingDealers.co.za  This test is not yet approved or  cleared by the [place] FDA and has been authorized for detection and/or diagnosis of SARS-CoV-2 by FDA under an Emergency Use Authorization (EUA).  This EUA will remain in effect (meaning this test can be used) for the duration of the COVID-19 declaration under Section 564(b)(1) of the Act, 21 U.S.C. section 360bbb-3(b)(1), unless the authorization is terminated or revoked sooner.  Performed at [facility], [place] [address] [place], [place] [address]   SARS Coronavirus 2 by RT PCR (hospital order, performed in [facility] hospital lab) Nasopharyngeal Nasopharyngeal Swab     Status: None   Collection Time: 07/26/19  8:56 AM   Specimen: Nasopharyngeal Swab  Result Value Ref Range Status   SARS Coronavirus 2 NEGATIVE NEGATIVE Final    Comment: (NOTE) SARS-CoV-2 target nucleic acids are NOT DETECTED.  The SARS-CoV-2 RNA is generally detectable in upper and lower respiratory specimens during the acute phase of infection. The lowest concentration of SARS-CoV-2 viral copies this assay can detect is  250 copies / mL. A negative result does not preclude SARS-CoV-2 infection and should not be used as the sole basis for treatment or other patient management decisions.  A negative result may occur with improper specimen collection / handling, submission of specimen other than nasopharyngeal swab, presence of viral mutation(s) within the areas targeted by this assay, and inadequate number of viral copies (<250 copies / mL). A negative result must be combined with clinical observations, patient history, and epidemiological information.  Fact Sheet for Patients:   StrictlyIdeas.no  Fact Sheet for Healthcare Providers: BankingDealers.co.za  This test is not yet approved or  cleared by the [place] FDA and has been authorized for detection  and/or diagnosis of SARS-CoV-2 by FDA under an Emergency Use Authorization (EUA).  This EUA will remain in effect (meaning this test can be used) for the duration of the COVID-19 declaration under Section 564(b)(1) of the Act, 21 U.S.C. section 360bbb-3(b)(1), unless the authorization is terminated or revoked sooner.  Performed at [facility], [place] [address] [place], [place] [address]      Labs: BNP (last 3 results) Recent Labs    05/26/19 1457 06/27/19 0539 07/05/19 0759  BNP 827.0* 1,195.3* 962.8*   Basic Metabolic Panel: Recent Labs  Lab 07/22/19 0217 07/23/19 0421 07/24/19 0813 07/25/19 0516 07/26/19 0507  NA 147* 146* 147* 143 142  K 3.7 3.9 3.9 3.9 4.1  CL 109 105 106 103 103  CO2 28 31 33* 30 29  GLUCOSE 152* 176* 131* 104* 132*  BUN 44* 46* 44* 42* 37*  CREATININE 1.92* 1.91* 1.63* 1.60* 1.46*  CALCIUM 8.0* 8.1* 8.3* 8.2* 8.6*  MG 1.6* 2.0 2.0 1.8 1.7   Liver Function Tests: Recent Labs  Lab 07/25/19 0516 07/26/19 0507  AST 19 20  ALT 11 12  ALKPHOS 122 127*  BILITOT 1.0 1.5*  PROT 8.8* 9.2*  ALBUMIN 2.4* 2.5*   No results for input(s): LIPASE, AMYLASE in the last 168 hours. Recent Labs  Lab 07/23/19 0421  AMMONIA 34   CBC: Recent Labs  Lab 07/22/19 0217 07/23/19 0421 07/24/19 0813 07/25/19 0516 07/26/19 0507  WBC 7.6 7.6 6.2 5.8 6.8  NEUTROABS 5.6 6.0 5.3 4.2 5.0  HGB 7.9* 7.9* 8.3* 8.6* 8.7*  HCT 28.6* 29.1* 29.9* 31.2* 31.4*  MCV 102.1* 105.1* 102.7* 101.3* 99.7  PLT 168 161 147* 135* 132*   Cardiac Enzymes: No results for input(s): CKTOTAL, CKMB, CKMBINDEX, TROPONINI in the last 168 hours. BNP: Invalid input(s): POCBNP CBG: Recent Labs  Lab 07/25/19 0632 07/25/19 1116 07/25/19 1653 07/25/19 2101 07/26/19 0628  GLUCAP 111* 159* 150* 135* 133*   D-Dimer No results for input(s): DDIMER in the last 72 hours. Hgb A1c No results for input(s): HGBA1C in the last 72 hours. Lipid Profile No results for input(s):  CHOL, HDL, LDLCALC, TRIG, CHOLHDL, LDLDIRECT in the last 72 hours. Thyroid function studies No results for input(s): TSH, T4TOTAL, T3FREE, THYROIDAB in the last 72 hours.  Invalid input(s): FREET3 Anemia work up No results for input(s): VITAMINB12, FOLATE, FERRITIN, TIBC, IRON, RETICCTPCT in the last 72 hours. Urinalysis    Component Value Date/Time   COLORURINE YELLOW 06/27/2019 [place] 06/27/2019 1242   APPEARANCEUR Clear 08/03/2013 2138   LABSPEC 1.011 06/27/2019 1242   LABSPEC 1.028 08/03/2013 2138   PHURINE 5.0 06/27/2019 1242   GLUCOSEU NEGATIVE 06/27/2019 1242   GLUCOSEU Negative 08/03/2013 2138   HGBUR NEGATIVE 06/27/2019 [place] 06/27/2019 1242  BILIRUBINUR Negative 08/03/2013 2138   KETONESUR NEGATIVE 06/27/2019 1242   PROTEINUR NEGATIVE 06/27/2019 1242   UROBILINOGEN 0.2 09/27/2006 2132   NITRITE NEGATIVE 06/27/2019 1242   LEUKOCYTESUR NEGATIVE 06/27/2019 1242   LEUKOCYTESUR Negative 08/03/2013 2138   Sepsis Labs Invalid input(s): PROCALCITONIN,  WBC,  LACTICIDVEN Microbiology Recent Results (from the past 240 hour(s))  SARS Coronavirus 2 by RT PCR (hospital order, performed in [facility] hospital lab) Nasopharyngeal Nasopharyngeal Swab     Status: None   Collection Time: 07/23/19 12:46 PM   Specimen: Nasopharyngeal Swab  Result Value Ref Range Status   SARS Coronavirus 2 NEGATIVE NEGATIVE Final    Comment: (NOTE) SARS-CoV-2 target nucleic acids are NOT DETECTED.  The SARS-CoV-2 RNA is generally detectable in upper and lower respiratory specimens during the acute phase of infection. The lowest concentration of SARS-CoV-2 viral copies this assay can detect is 250 copies / mL. A negative result does not preclude SARS-CoV-2 infection and should not be used as the sole basis for treatment or other patient management decisions.  A negative result may occur with improper specimen collection / handling, submission of specimen  other than nasopharyngeal swab, presence of viral mutation(s) within the areas targeted by this assay, and inadequate number of viral copies (<250 copies / mL). A negative result must be combined with clinical observations, patient history, and epidemiological information.  Fact Sheet for Patients:   StrictlyIdeas.no  Fact Sheet for Healthcare Providers: BankingDealers.co.za  This test is not yet approved or  cleared by the [place] FDA and has been authorized for detection and/or diagnosis of SARS-CoV-2 by FDA under an Emergency Use Authorization (EUA).  This EUA will remain in effect (meaning this test can be used) for the duration of the COVID-19 declaration under Section 564(b)(1) of the Act, 21 U.S.C. section 360bbb-3(b)(1), unless the authorization is terminated or revoked sooner.  Performed at [facility], [place] [address], [place], [place] [address]   SARS Coronavirus 2 by RT PCR (hospital order, performed in [facility] hospital lab) Nasopharyngeal Nasopharyngeal Swab     Status: None   Collection Time: 07/26/19  8:56 AM   Specimen: Nasopharyngeal Swab  Result Value Ref Range Status   SARS Coronavirus 2 NEGATIVE NEGATIVE Final    Comment: (NOTE) SARS-CoV-2 target nucleic acids are NOT DETECTED.  The SARS-CoV-2 RNA is generally detectable in upper and lower respiratory specimens during the acute phase of infection. The lowest concentration of SARS-CoV-2 viral copies this assay can detect is 250 copies / mL. A negative result does not preclude SARS-CoV-2 infection and should not be used as the sole basis for treatment or other patient management decisions.  A negative result may occur with improper specimen collection / handling, submission of specimen other than nasopharyngeal swab, presence of viral mutation(s) within the areas targeted by this assay, and inadequate number of viral copies (<250 copies / mL). A  negative result must be combined with clinical observations, patient history, and epidemiological information.  Fact Sheet for Patients:   StrictlyIdeas.no  Fact Sheet for Healthcare Providers: BankingDealers.co.za  This test is not yet approved or  cleared by the [place] FDA and has been authorized for detection and/or diagnosis of SARS-CoV-2 by FDA under an Emergency Use Authorization (EUA).  This EUA will remain in effect (meaning this test can be used) for the duration of the COVID-19 declaration under Section 564(b)(1) of the Act, 21 U.S.C. section 360bbb-3(b)(1), unless the authorization is terminated or revoked sooner.  Performed at [facility], [place] [address], [place], [place] [address]      Time coordinating discharge: 35 minutes  SIGNED:   Jonnie Finner, DO  [facility] 07/26/2019, 11:07 AM   If 7PM-7AM, please contact night-coverage [email or web address]

## 2019-07-26 NOTE — TOC Transition Note (Addendum)
Transition of Care [facility]) - CM/SW Discharge Note   Patient Details  Name: [name] MRN: [id number] Date of Birth: 1955-02-24  [facility] CM/SW Contact:  Vinie Sill, [place] Phone Number: 07/26/2019, 2:25 PM   Clinical Narrative:     Patient will DC to: [facility] Date: 07/26/2019 Family Notified: Kela Millin, spouse  Transport By: Corey Harold  Per MD patient is ready for discharge. RN, patient, and facility notified of DC. Discharge Summary sent to facility. RN given number for [phone], [facility] [place] transport requested for patient.   Clinical Social Worker signing off.  Thurmond Butts, MSW, [place] Clinical Social Worker    Final next level of care: Skilled Nursing Facility Barriers to Discharge: Barriers Resolved   Patient Goals and CMS Choice        Discharge Placement              Patient chooses bed at: [facility] Patient to be transferred to facility by: [place] Name of family member notified: Sherene Sires Patient and family notified of of transfer: 07/26/19  Discharge Plan and Services                                     Social Determinants of Health (SDOH) Interventions     Readmission Risk Interventions Readmission Risk Prevention Plan 05/28/2019 02/26/2019 12/17/2018  Transportation Screening Complete Complete Complete  PCP or Specialist Appt within 5-7 Days - - -  Home Care Screening - - -  Medication Review (RN CM) - - -  [place]RI or [place] Work Consult for [place] Planning/Counseling - - -  SW consult not completed comments - - -  Palliative Care Screening - - -  Medication Review [employer] [facility]) Complete Complete Referral to Pharmacy  PCP or Specialist appointment within 3-5 days of discharge - (No Data) Not Complete  PCP/Specialist Appt Not Complete comments - - DC date unknown but pt is established with providers  [place] or [place] Complete Complete  Not Complete  HRI or Home Care Consult Pt Refusal Comments - - SNF resident  SW Recovery Care/Counseling Consult Complete - Complete  Palliative Care Screening Not Applicable Not Applicable Not Complete  Comments - - pending need  [place] Complete Patient Refused Complete  Some recent data might be hidden

## 2019-08-16 ENCOUNTER — Encounter: Payer: Self-pay | Admitting: Physician Assistant

## 2019-08-16 ENCOUNTER — Ambulatory Visit (INDEPENDENT_AMBULATORY_CARE_PROVIDER_SITE_OTHER): Payer: Self-pay | Admitting: Physician Assistant

## 2019-08-16 ENCOUNTER — Other Ambulatory Visit: Payer: Self-pay

## 2019-08-16 DIAGNOSIS — Z89611 Acquired absence of right leg above knee: Secondary | ICD-10-CM

## 2019-08-16 DIAGNOSIS — Z89619 Acquired absence of unspecified leg above knee: Secondary | ICD-10-CM | POA: Insufficient documentation

## 2019-08-16 NOTE — Progress Notes (Signed)
    Postoperative Visit    History of Present Illness   SASCHA PALMA is a 63 y.o. female who presents for postoperative follow-up for: right above-the-knee amputation by Dr. Carlis Abbott (Date: 07/04/19).  The patient's wounds are healed.  She resides at [facility] facility and is only accompanied by her driver today.  She is not communicating or participating in conversation.  She has a known left heel ulceration however based on her mental status and level of function she is not a candidate for revascularization of left lower extremity.  For VQI Use Only   PRE-ADM LIVING: Nursing home  AMB STATUS: Wheelchair   Physical Examination   Vitals:   08/16/19 1534  BP: 120/76  Pulse: 100  Resp: 20  Temp: 98.1 F (36.7 C)    RLE: Stump incision is healed   Medical Decision Making   WHISPER KURKA is a 64 y.o. female who presents s/p right above-the-knee amputation.   Right AKA well-healed  Known left extensive heel ulceration however because of her mental status and level of function she is not a candidate for left lower extremity revascularization  She was evaluated by palliative care and hospice during recent inpatient stay; At the time, her spouse refused hospice care  She will likely require left above-the-knee amputation versus hospice; recommendation would be to re-connect patient and spouse with hospice to discuss goals of care  Dagoberto Ligas PA-C [facility] Office: [facility] MD: [place] [facility]

## 2019-08-20 ENCOUNTER — Emergency Department (HOSPITAL_COMMUNITY)
Admission: EM | Admit: 2019-08-20 | Discharge: 2019-09-18 | DRG: 682 | Disposition: E | Payer: Medicaid Other | Source: Skilled Nursing Facility | Attending: Pulmonary Disease | Admitting: Pulmonary Disease

## 2019-08-20 ENCOUNTER — Emergency Department (HOSPITAL_COMMUNITY): Payer: Medicaid Other

## 2019-08-20 DIAGNOSIS — J449 Chronic obstructive pulmonary disease, unspecified: Secondary | ICD-10-CM | POA: Diagnosis present

## 2019-08-20 DIAGNOSIS — I462 Cardiac arrest due to underlying cardiac condition: Secondary | ICD-10-CM | POA: Diagnosis not present

## 2019-08-20 DIAGNOSIS — I959 Hypotension, unspecified: Secondary | ICD-10-CM | POA: Diagnosis not present

## 2019-08-20 DIAGNOSIS — D7589 Other specified diseases of blood and blood-forming organs: Secondary | ICD-10-CM | POA: Diagnosis not present

## 2019-08-20 DIAGNOSIS — I1310 Hypertensive heart and chronic kidney disease without heart failure, with stage 1 through stage 4 chronic kidney disease, or unspecified chronic kidney disease: Secondary | ICD-10-CM | POA: Diagnosis not present

## 2019-08-20 DIAGNOSIS — Z6832 Body mass index (BMI) 32.0-32.9, adult: Secondary | ICD-10-CM

## 2019-08-20 DIAGNOSIS — N179 Acute kidney failure, unspecified: Secondary | ICD-10-CM | POA: Diagnosis present

## 2019-08-20 DIAGNOSIS — G9340 Encephalopathy, unspecified: Secondary | ICD-10-CM | POA: Diagnosis not present

## 2019-08-20 DIAGNOSIS — R001 Bradycardia, unspecified: Secondary | ICD-10-CM | POA: Diagnosis not present

## 2019-08-20 DIAGNOSIS — Z79899 Other long term (current) drug therapy: Secondary | ICD-10-CM

## 2019-08-20 DIAGNOSIS — J9601 Acute respiratory failure with hypoxia: Secondary | ICD-10-CM | POA: Diagnosis not present

## 2019-08-20 DIAGNOSIS — E872 Acidosis: Secondary | ICD-10-CM | POA: Diagnosis present

## 2019-08-20 DIAGNOSIS — M549 Dorsalgia, unspecified: Secondary | ICD-10-CM | POA: Diagnosis present

## 2019-08-20 DIAGNOSIS — E669 Obesity, unspecified: Secondary | ICD-10-CM | POA: Diagnosis present

## 2019-08-20 DIAGNOSIS — I2510 Atherosclerotic heart disease of native coronary artery without angina pectoris: Secondary | ICD-10-CM | POA: Diagnosis not present

## 2019-08-20 DIAGNOSIS — Z7901 Long term (current) use of anticoagulants: Secondary | ICD-10-CM

## 2019-08-20 DIAGNOSIS — Z8249 Family history of ischemic heart disease and other diseases of the circulatory system: Secondary | ICD-10-CM

## 2019-08-20 DIAGNOSIS — Y92239 Unspecified place in hospital as the place of occurrence of the external cause: Secondary | ICD-10-CM | POA: Diagnosis not present

## 2019-08-20 DIAGNOSIS — Z888 Allergy status to other drugs, medicaments and biological substances status: Secondary | ICD-10-CM

## 2019-08-20 DIAGNOSIS — I252 Old myocardial infarction: Secondary | ICD-10-CM

## 2019-08-20 DIAGNOSIS — R7989 Other specified abnormal findings of blood chemistry: Secondary | ICD-10-CM

## 2019-08-20 DIAGNOSIS — Z91040 Latex allergy status: Secondary | ICD-10-CM

## 2019-08-20 DIAGNOSIS — K449 Diaphragmatic hernia without obstruction or gangrene: Secondary | ICD-10-CM | POA: Diagnosis present

## 2019-08-20 DIAGNOSIS — Z66 Do not resuscitate: Secondary | ICD-10-CM | POA: Diagnosis not present

## 2019-08-20 DIAGNOSIS — J9622 Acute and chronic respiratory failure with hypercapnia: Secondary | ICD-10-CM | POA: Diagnosis present

## 2019-08-20 DIAGNOSIS — N1832 Chronic kidney disease, stage 3b: Secondary | ICD-10-CM | POA: Diagnosis present

## 2019-08-20 DIAGNOSIS — E1151 Type 2 diabetes mellitus with diabetic peripheral angiopathy without gangrene: Secondary | ICD-10-CM | POA: Diagnosis present

## 2019-08-20 DIAGNOSIS — K7469 Other cirrhosis of liver: Secondary | ICD-10-CM | POA: Diagnosis present

## 2019-08-20 DIAGNOSIS — D696 Thrombocytopenia, unspecified: Secondary | ICD-10-CM | POA: Diagnosis present

## 2019-08-20 DIAGNOSIS — N170 Acute kidney failure with tubular necrosis: Secondary | ICD-10-CM

## 2019-08-20 DIAGNOSIS — Z89611 Acquired absence of right leg above knee: Secondary | ICD-10-CM | POA: Diagnosis not present

## 2019-08-20 DIAGNOSIS — E1122 Type 2 diabetes mellitus with diabetic chronic kidney disease: Secondary | ICD-10-CM | POA: Diagnosis present

## 2019-08-20 DIAGNOSIS — I4510 Unspecified right bundle-branch block: Secondary | ICD-10-CM | POA: Diagnosis present

## 2019-08-20 DIAGNOSIS — Z794 Long term (current) use of insulin: Secondary | ICD-10-CM

## 2019-08-20 DIAGNOSIS — G92 Toxic encephalopathy: Secondary | ICD-10-CM | POA: Diagnosis present

## 2019-08-20 DIAGNOSIS — Z833 Family history of diabetes mellitus: Secondary | ICD-10-CM

## 2019-08-20 DIAGNOSIS — T4275XA Adverse effect of unspecified antiepileptic and sedative-hypnotic drugs, initial encounter: Secondary | ICD-10-CM | POA: Diagnosis present

## 2019-08-20 DIAGNOSIS — D539 Nutritional anemia, unspecified: Secondary | ICD-10-CM | POA: Diagnosis present

## 2019-08-20 DIAGNOSIS — J9602 Acute respiratory failure with hypercapnia: Secondary | ICD-10-CM | POA: Diagnosis present

## 2019-08-20 DIAGNOSIS — G4733 Obstructive sleep apnea (adult) (pediatric): Secondary | ICD-10-CM | POA: Diagnosis present

## 2019-08-20 DIAGNOSIS — I4891 Unspecified atrial fibrillation: Secondary | ICD-10-CM | POA: Diagnosis not present

## 2019-08-20 DIAGNOSIS — Z8541 Personal history of malignant neoplasm of cervix uteri: Secondary | ICD-10-CM | POA: Diagnosis not present

## 2019-08-20 DIAGNOSIS — Z20822 Contact with and (suspected) exposure to covid-19: Secondary | ICD-10-CM | POA: Diagnosis not present

## 2019-08-20 DIAGNOSIS — R570 Cardiogenic shock: Secondary | ICD-10-CM | POA: Diagnosis not present

## 2019-08-20 DIAGNOSIS — J9811 Atelectasis: Secondary | ICD-10-CM | POA: Diagnosis not present

## 2019-08-20 DIAGNOSIS — Z9911 Dependence on respirator [ventilator] status: Secondary | ICD-10-CM | POA: Diagnosis not present

## 2019-08-20 DIAGNOSIS — B1920 Unspecified viral hepatitis C without hepatic coma: Secondary | ICD-10-CM | POA: Diagnosis present

## 2019-08-20 DIAGNOSIS — Z9981 Dependence on supplemental oxygen: Secondary | ICD-10-CM

## 2019-08-20 DIAGNOSIS — Z8673 Personal history of transient ischemic attack (TIA), and cerebral infarction without residual deficits: Secondary | ICD-10-CM

## 2019-08-20 DIAGNOSIS — I5043 Acute on chronic combined systolic (congestive) and diastolic (congestive) heart failure: Secondary | ICD-10-CM | POA: Diagnosis present

## 2019-08-20 DIAGNOSIS — Z9049 Acquired absence of other specified parts of digestive tract: Secondary | ICD-10-CM

## 2019-08-20 DIAGNOSIS — Z803 Family history of malignant neoplasm of breast: Secondary | ICD-10-CM

## 2019-08-20 DIAGNOSIS — E785 Hyperlipidemia, unspecified: Secondary | ICD-10-CM | POA: Diagnosis present

## 2019-08-20 DIAGNOSIS — Z8711 Personal history of peptic ulcer disease: Secondary | ICD-10-CM | POA: Diagnosis not present

## 2019-08-20 DIAGNOSIS — G8929 Other chronic pain: Secondary | ICD-10-CM | POA: Diagnosis present

## 2019-08-20 DIAGNOSIS — F17210 Nicotine dependence, cigarettes, uncomplicated: Secondary | ICD-10-CM | POA: Diagnosis present

## 2019-08-20 LAB — URINALYSIS, ROUTINE W REFLEX MICROSCOPIC
Bilirubin Urine: NEGATIVE
Glucose, UA: NEGATIVE mg/dL
Hgb urine dipstick: NEGATIVE
Ketones, ur: NEGATIVE mg/dL
Leukocytes,Ua: NEGATIVE
Nitrite: NEGATIVE
Protein, ur: >=300 mg/dL — AB
Specific Gravity, Urine: 1.019 (ref 1.005–1.030)
pH: 5 (ref 5.0–8.0)

## 2019-08-20 LAB — COMPREHENSIVE METABOLIC PANEL
ALT: 10 U/L (ref 0–44)
AST: 15 U/L (ref 15–41)
Albumin: 2.7 g/dL — ABNORMAL LOW (ref 3.5–5.0)
Alkaline Phosphatase: 96 U/L (ref 38–126)
Anion gap: 13 (ref 5–15)
BUN: 53 mg/dL — ABNORMAL HIGH (ref 8–23)
CO2: 25 mmol/L (ref 22–32)
Calcium: 8.9 mg/dL (ref 8.9–10.3)
Chloride: 105 mmol/L (ref 98–111)
Creatinine, Ser: 2.41 mg/dL — ABNORMAL HIGH (ref 0.44–1.00)
GFR calc Af Amer: 24 mL/min — ABNORMAL LOW (ref >60)
GFR calc non Af Amer: 21 mL/min — ABNORMAL LOW (ref >60)
Glucose, Bld: 211 mg/dL — ABNORMAL HIGH (ref 70–99)
Potassium: 4.8 mmol/L (ref 3.5–5.1)
Sodium: 143 mmol/L (ref 135–145)
Total Bilirubin: 0.9 mg/dL (ref 0.3–1.2)
Total Protein: 8.6 g/dL — ABNORMAL HIGH (ref 6.5–8.1)

## 2019-08-20 LAB — I-STAT ARTERIAL BLOOD GAS, ED
Acid-Base Excess: 0 mmol/L (ref 0.0–2.0)
Acid-Base Excess: 3 mmol/L — ABNORMAL HIGH (ref 0.0–2.0)
Acid-base deficit: 1 mmol/L (ref 0.0–2.0)
Bicarbonate: 29.5 mmol/L — ABNORMAL HIGH (ref 20.0–28.0)
Bicarbonate: 28.9 mmol/L — ABNORMAL HIGH (ref 20.0–28.0)
Bicarbonate: 27.1 mmol/L (ref 20.0–28.0)
Calcium, Ion: 1.31 mmol/L (ref 1.15–1.40)
Calcium, Ion: 1.31 mmol/L (ref 1.15–1.40)
Calcium, Ion: 1.21 mmol/L (ref 1.15–1.40)
HCT: 38 % (ref 36.0–46.0)
HCT: 34 % — ABNORMAL LOW (ref 36.0–46.0)
HCT: 31 % — ABNORMAL LOW (ref 36.0–46.0)
Hemoglobin: 12.9 g/dL (ref 12.0–15.0)
Hemoglobin: 11.6 g/dL — ABNORMAL LOW (ref 12.0–15.0)
Hemoglobin: 10.5 g/dL — ABNORMAL LOW (ref 12.0–15.0)
O2 Saturation: 94 %
O2 Saturation: 94 %
O2 Saturation: 100 %
Patient temperature: 98.3
Patient temperature: 98.3
Patient temperature: 98.3
Potassium: 4.9 mmol/L (ref 3.5–5.1)
Potassium: 4.6 mmol/L (ref 3.5–5.1)
Potassium: 4.4 mmol/L (ref 3.5–5.1)
Sodium: 146 mmol/L — ABNORMAL HIGH (ref 135–145)
Sodium: 146 mmol/L — ABNORMAL HIGH (ref 135–145)
Sodium: 146 mmol/L — ABNORMAL HIGH (ref 135–145)
TCO2: 32 mmol/L (ref 22–32)
TCO2: 31 mmol/L (ref 22–32)
TCO2: 28 mmol/L (ref 22–32)
pCO2 arterial: 80 mmHg (ref 32.0–48.0)
pCO2 arterial: 69.4 mmHg (ref 32.0–48.0)
pCO2 arterial: 37.2 mmHg (ref 32.0–48.0)
pH, Arterial: 7.174 — CL (ref 7.350–7.450)
pH, Arterial: 7.226 — ABNORMAL LOW (ref 7.350–7.450)
pH, Arterial: 7.47 — ABNORMAL HIGH (ref 7.350–7.450)
pO2, Arterial: 93 mmHg (ref 83.0–108.0)
pO2, Arterial: 86 mmHg (ref 83.0–108.0)
pO2, Arterial: 260 mmHg — ABNORMAL HIGH (ref 83.0–108.0)

## 2019-08-20 LAB — SARS CORONAVIRUS 2 BY RT PCR (HOSPITAL ORDER, PERFORMED IN ~~LOC~~ HOSPITAL LAB): SARS Coronavirus 2: NEGATIVE

## 2019-08-20 LAB — CBC WITH DIFFERENTIAL/PLATELET
Abs Immature Granulocytes: 0.03 10*3/uL (ref 0.00–0.07)
Basophils Absolute: 0.1 10*3/uL (ref 0.0–0.1)
Basophils Relative: 1 %
Eosinophils Absolute: 0.1 10*3/uL (ref 0.0–0.5)
Eosinophils Relative: 2 %
HCT: 41.2 % (ref 36.0–46.0)
Hemoglobin: 10.8 g/dL — ABNORMAL LOW (ref 12.0–15.0)
Immature Granulocytes: 0 %
Lymphocytes Relative: 9 %
Lymphs Abs: 0.6 10*3/uL — ABNORMAL LOW (ref 0.7–4.0)
MCH: 26.7 pg (ref 26.0–34.0)
MCHC: 26.2 g/dL — ABNORMAL LOW (ref 30.0–36.0)
MCV: 102 fL — ABNORMAL HIGH (ref 80.0–100.0)
Monocytes Absolute: 0.8 10*3/uL (ref 0.1–1.0)
Monocytes Relative: 10 %
Neutro Abs: 5.7 10*3/uL (ref 1.7–7.7)
Neutrophils Relative %: 78 %
Platelets: 134 10*3/uL — ABNORMAL LOW (ref 150–400)
RBC: 4.04 MIL/uL (ref 3.87–5.11)
RDW: 17.6 % — ABNORMAL HIGH (ref 11.5–15.5)
WBC: 7.3 10*3/uL (ref 4.0–10.5)
nRBC: 0 % (ref 0.0–0.2)

## 2019-08-20 LAB — LACTIC ACID, PLASMA: Lactic Acid, Venous: 1 mmol/L (ref 0.5–1.9)

## 2019-08-20 LAB — AMMONIA: Ammonia: 61 umol/L — ABNORMAL HIGH (ref 9–35)

## 2019-08-20 LAB — BRAIN NATRIURETIC PEPTIDE: B Natriuretic Peptide: 1321.6 pg/mL — ABNORMAL HIGH (ref 0.0–100.0)

## 2019-08-20 LAB — HEMOGLOBIN A1C
Hgb A1c MFr Bld: 5.5 % (ref 4.8–5.6)
Mean Plasma Glucose: 111.15 mg/dL

## 2019-08-20 LAB — CBG MONITORING, ED: Glucose-Capillary: 193 mg/dL — ABNORMAL HIGH (ref 70–99)

## 2019-08-20 MED ORDER — EPINEPHRINE HCL 5 MG/250ML IV SOLN IN NS
INTRAVENOUS | Status: AC
  Filled 2019-08-20: qty 250

## 2019-08-20 MED ORDER — HEPARIN SODIUM (PORCINE) 5000 UNIT/ML IJ SOLN
5000.0000 [IU] | Freq: Three times a day (TID) | INTRAMUSCULAR | Status: DC
  Administered 2019-08-20: 5000 [IU] via SUBCUTANEOUS
  Filled 2019-08-20: qty 1

## 2019-08-20 MED ORDER — FENTANYL CITRATE (PF) 100 MCG/2ML IJ SOLN
50.0000 ug | Freq: Once | INTRAMUSCULAR | Status: AC
  Administered 2019-08-20: 50 ug via INTRAVENOUS
  Filled 2019-08-20: qty 2

## 2019-08-20 MED ORDER — SODIUM BICARBONATE 8.4 % IV SOLN
INTRAVENOUS | Status: AC | PRN
  Administered 2019-08-20: 25 meq via INTRAVENOUS

## 2019-08-20 MED ORDER — PROPOFOL 1000 MG/100ML IV EMUL
0.0000 ug/kg/min | INTRAVENOUS | Status: DC
  Filled 2019-08-20: qty 100

## 2019-08-20 MED ORDER — FENTANYL CITRATE (PF) 100 MCG/2ML IJ SOLN: 100.0000 ug | Freq: Once | INTRAMUSCULAR | Status: DC

## 2019-08-20 MED ORDER — POLYETHYLENE GLYCOL 3350 17 G PO PACK: 17.0000 g | PACK | Freq: Every day | ORAL | Status: DC | PRN

## 2019-08-20 MED ORDER — FENTANYL 2500MCG IN NS 250ML (10MCG/ML) PREMIX INFUSION
50.0000 ug/h | INTRAVENOUS | Status: DC
  Administered 2019-08-20: 50 ug/h via INTRAVENOUS
  Filled 2019-08-20: qty 250

## 2019-08-20 MED ORDER — FENTANYL CITRATE (PF) 100 MCG/2ML IJ SOLN
INTRAMUSCULAR | Status: AC
  Filled 2019-08-20: qty 2

## 2019-08-20 MED ORDER — LACTULOSE 10 GM/15ML PO SOLN: 10.0000 g | Freq: Two times a day (BID) | ORAL | Status: DC

## 2019-08-20 MED ORDER — FENTANYL BOLUS VIA INFUSION
50.0000 ug | INTRAVENOUS | Status: DC | PRN
  Filled 2019-08-20: qty 50

## 2019-08-20 MED ORDER — MIDAZOLAM HCL 2 MG/2ML IJ SOLN
INTRAMUSCULAR | Status: AC
  Administered 2019-08-20: 5 mg via INTRAVENOUS
  Filled 2019-08-20: qty 6

## 2019-08-20 MED ORDER — POLYETHYLENE GLYCOL 3350 17 G PO PACK: 17.0000 g | PACK | Freq: Every day | ORAL | Status: DC

## 2019-08-20 MED ORDER — FAMOTIDINE IN NACL 20-0.9 MG/50ML-% IV SOLN
20.0000 mg | INTRAVENOUS | Status: DC
  Administered 2019-08-20: 20 mg via INTRAVENOUS
  Filled 2019-08-20: qty 50

## 2019-08-20 MED ORDER — ETOMIDATE 2 MG/ML IV SOLN
INTRAVENOUS | Status: AC | PRN
  Administered 2019-08-20: 20 mg via INTRAVENOUS

## 2019-08-20 MED ORDER — SODIUM CHLORIDE 0.9 % IV SOLN
3.0000 g | Freq: Two times a day (BID) | INTRAVENOUS | Status: DC
  Filled 2019-08-20 (×2): qty 8

## 2019-08-20 MED ORDER — MIDAZOLAM HCL 2 MG/2ML IJ SOLN: 5.0000 mg | Freq: Once | INTRAMUSCULAR | Status: AC

## 2019-08-20 MED ORDER — SODIUM CHLORIDE 0.9 % IV BOLUS
1000.0000 mL | Freq: Once | INTRAVENOUS | Status: AC
  Administered 2019-08-20: 1000 mL via INTRAVENOUS

## 2019-08-20 MED ORDER — DOCUSATE SODIUM 100 MG PO CAPS: 100.0000 mg | ORAL_CAPSULE | Freq: Two times a day (BID) | ORAL | Status: DC | PRN

## 2019-08-20 MED ORDER — EPINEPHRINE 1 MG/10ML IJ SOSY
PREFILLED_SYRINGE | INTRAMUSCULAR | Status: AC | PRN
  Administered 2019-08-20 (×4): 0.1 mg via INTRAVENOUS

## 2019-08-20 MED ORDER — EPINEPHRINE HCL 5 MG/250ML IV SOLN IN NS: 0.5000 ug/min | INTRAVENOUS | Status: DC

## 2019-08-20 MED ORDER — INSULIN ASPART 100 UNIT/ML ~~LOC~~ SOLN: 0.0000 [IU] | SUBCUTANEOUS | Status: DC

## 2019-08-20 MED ORDER — FAMOTIDINE IN NACL 20-0.9 MG/50ML-% IV SOLN: 20.0000 mg | Freq: Two times a day (BID) | INTRAVENOUS | Status: DC

## 2019-08-20 MED ORDER — SUCCINYLCHOLINE CHLORIDE 20 MG/ML IJ SOLN
INTRAMUSCULAR | Status: AC | PRN
  Administered 2019-08-20: 100 mg via INTRAVENOUS

## 2019-08-20 MED ORDER — PROPOFOL 1000 MG/100ML IV EMUL
INTRAVENOUS | Status: AC
  Filled 2019-08-20: qty 100

## 2019-08-20 MED ORDER — DOCUSATE SODIUM 50 MG/5ML PO LIQD
100.0000 mg | Freq: Two times a day (BID) | ORAL | Status: DC
  Administered 2019-08-20: 100 mg
  Filled 2019-08-20: qty 10

## 2019-08-21 MED FILL — Medication: Qty: 1 | Status: AC

## 2019-08-21 NOTE — ED Notes (Signed)
Patient to morgue at this time.

## 2019-08-21 NOTE — Discharge Summary (Signed)
Admit Date: 08-30-19 Time of Death: 2019/09/21 [facility] course: Patient with multiple admissions this year secondary to encephalopathy, cellulitis, group c bacteremia and anasarca.  Today, she presents from SNF after being found altered.  Per notes, patient was alert and answering questions yesterday. Today, she was found to be non verbal, responding to painful stimuli. VS on presentation: temp 98.3, HR: 108, RR: 22, BP: 118/81 with SpO2 98%.  Labs notable for ABG: 7.17/80/93/32, Glucose 211, BUN: 53, Creatinine 2.41.  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  CXR with bilateral pleural effusions. She failed Bipap and ultimately required intubation. She received Etomidate and Succinylcholine at 1955 for intubation, 1 L IVF, and started on fentanyl drip for ETT tolerance. She remained afebrile with normal WBC. Unasyn was started empirically for aspiration coverage.  Due to episode of hypotension, lasix was held this evening.  Hemodynamics improved.  Repeat ABG showed improving acidosis and adequate oxygenation, K 4.4.  While in the ED, patient had PEA arrest, compressions were started and epi, bicarb administered. Once ROSC achieved, patient was noted to be in Afib with RVR and received cardioversion.  Please see code blue note for further details. Patient's husband was called and updated, goals of care discussions were initiated by Dr Loanne Drilling.  Her code status was changed to DNR.   Subsequently, patient had recurrent bradycardic arrest and was pronounced in the ED by Dr Roxanne Mins .  An ECHO was pending.   Problem List: Acute hypercarbic respiratory failure Bilateral pleural effusions Concern for aspiration Acute metabolic encephalopathy Hypotension, transient Acute on chronic combined heart failure.  DM, type II  Cause of Death: Acute on chronic combined heart failure

## 2019-09-11 IMAGING — US VENOUS DOPPLER ULTRASOUND OF LEFT LOWER EXTREMITY
1 series · 13 of 24 positions shown · non-contrast
Comparison: Ultrasound 01/13/2018.

CLINICAL DATA: Left lower extremity swelling.



[Series 1: venous doppler ultrasound of left lower extremity · 0.06mm/px · 13 of 37 slices shown]
[im 1/37]
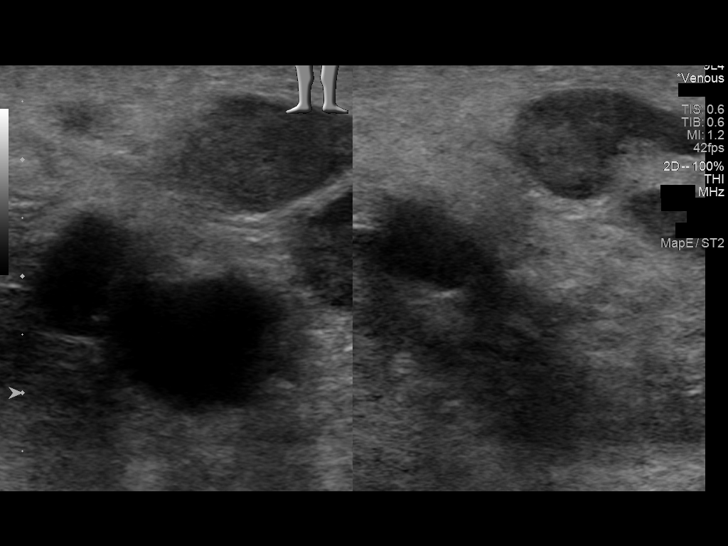
[im 4/37]
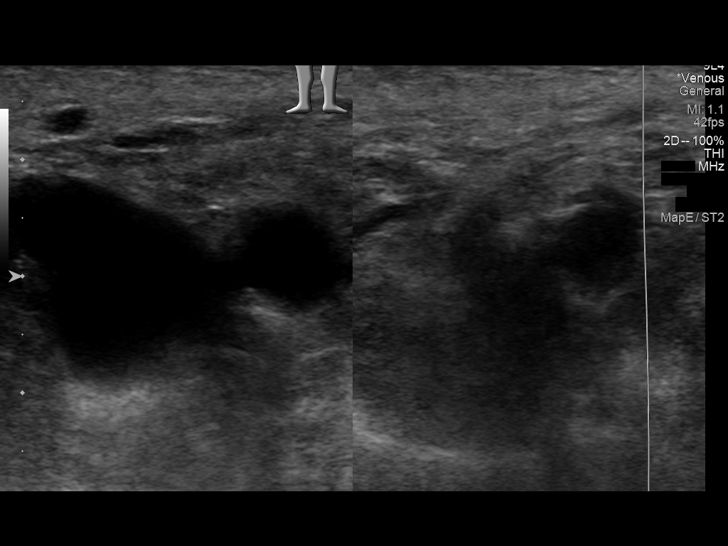
[im 7/37]
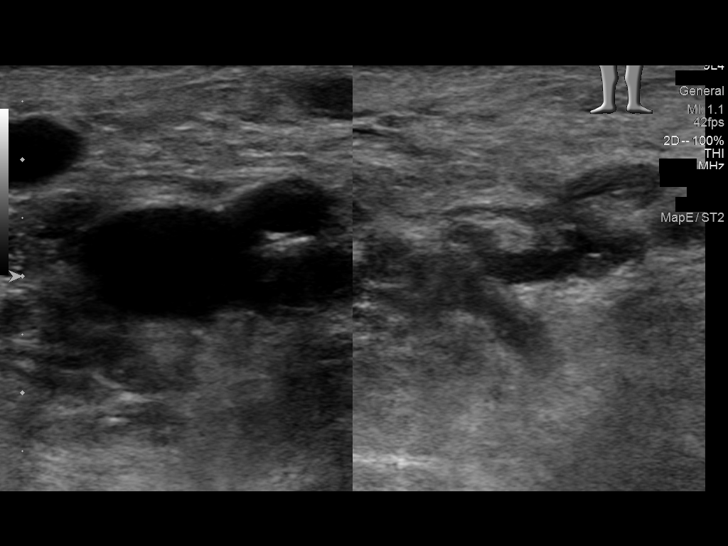
[im 10/37]
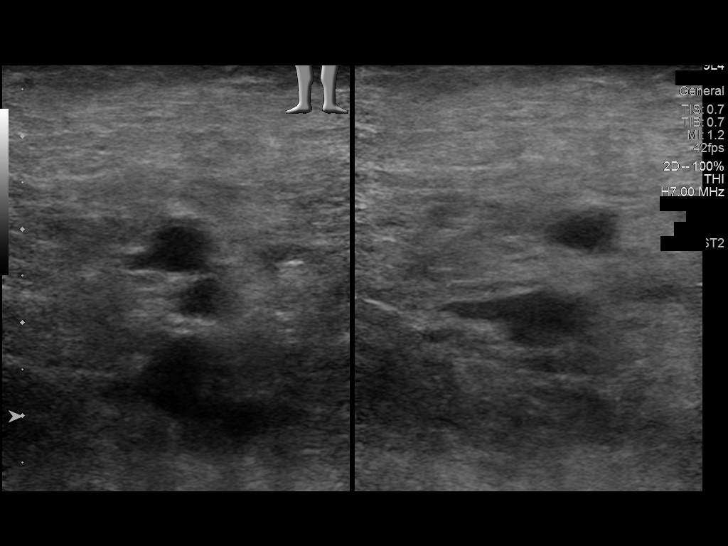
[im 13/37]
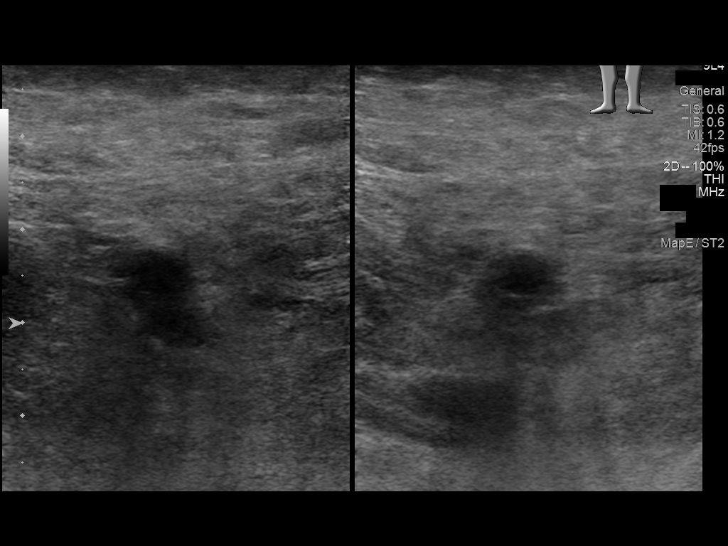
[im 16/37]
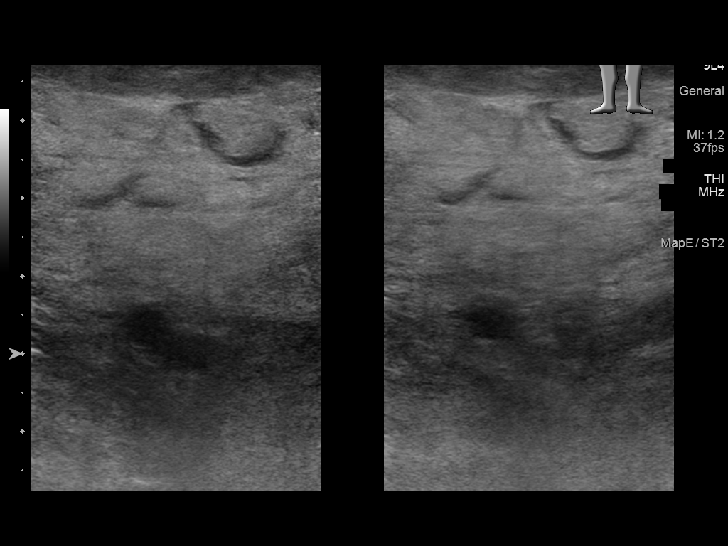
[im 19/37]
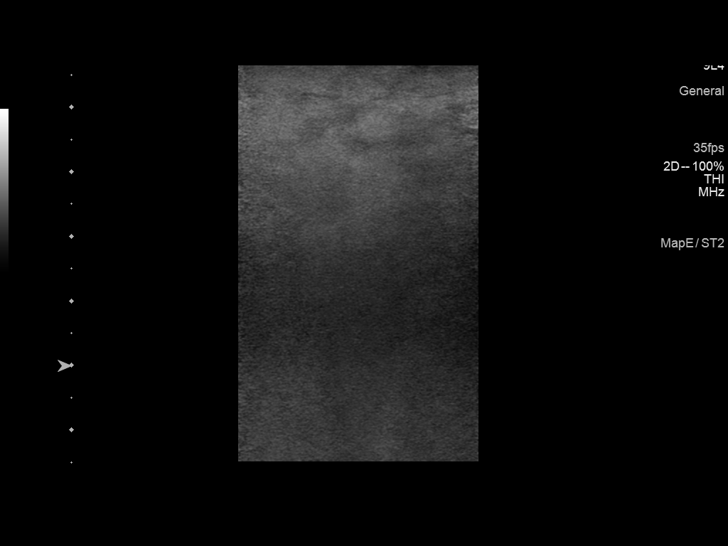
[im 21/37]
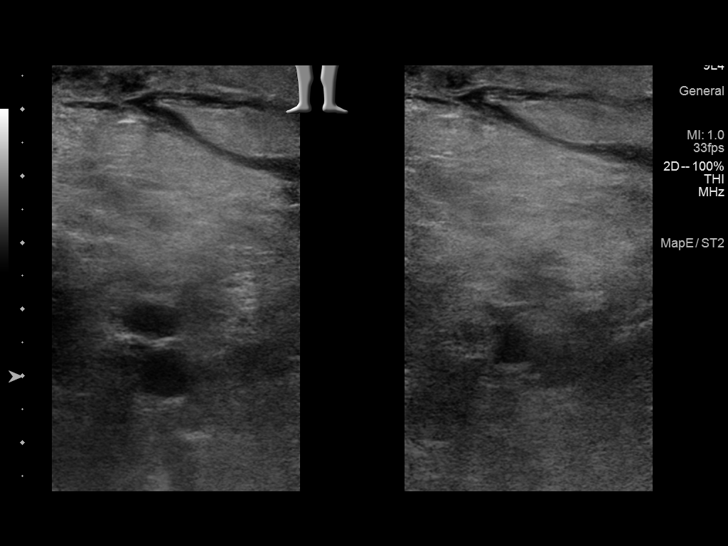
[im 24/37]
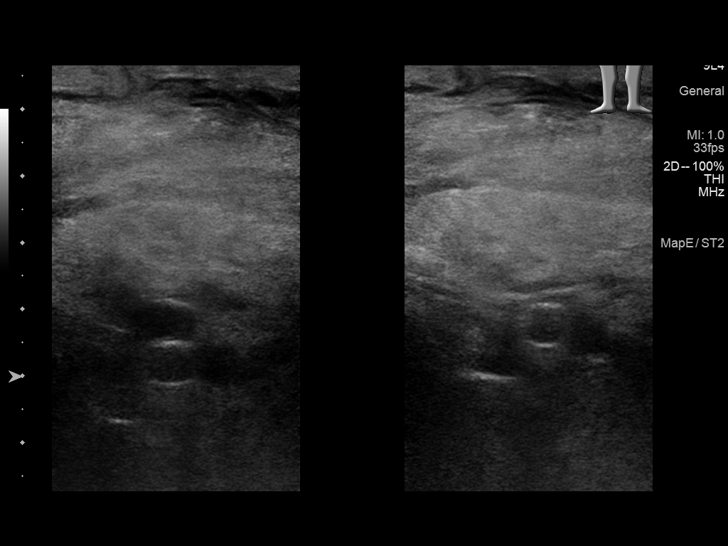
[im 27/37]
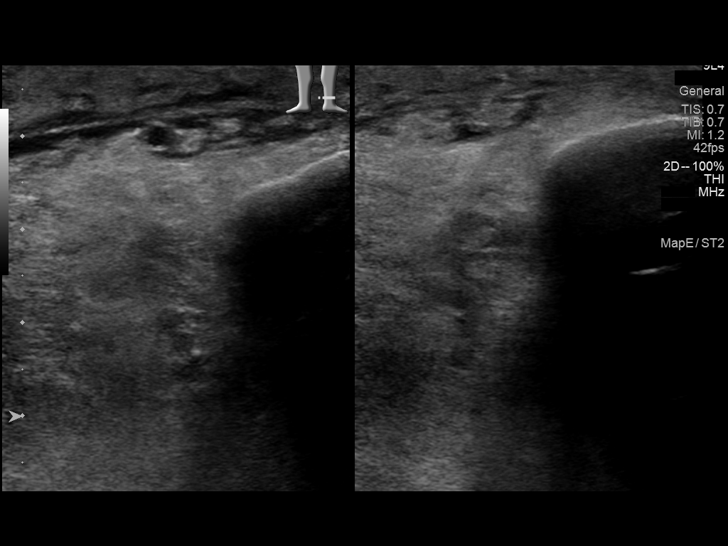
[im 30/37]
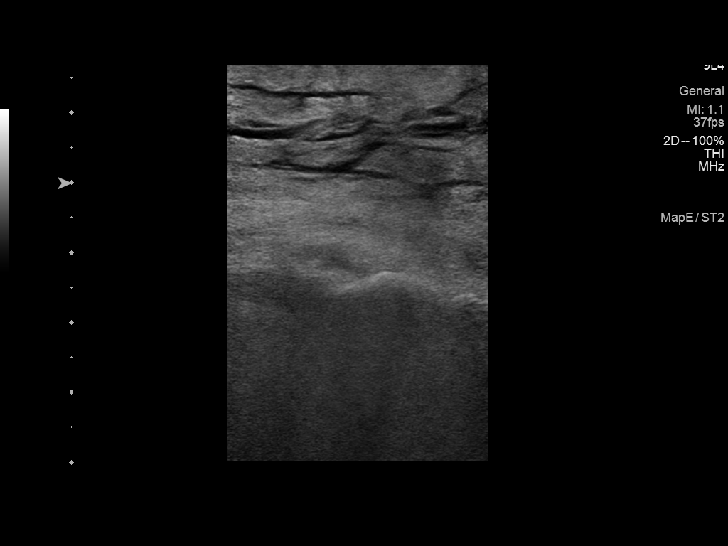
[im 33/37]
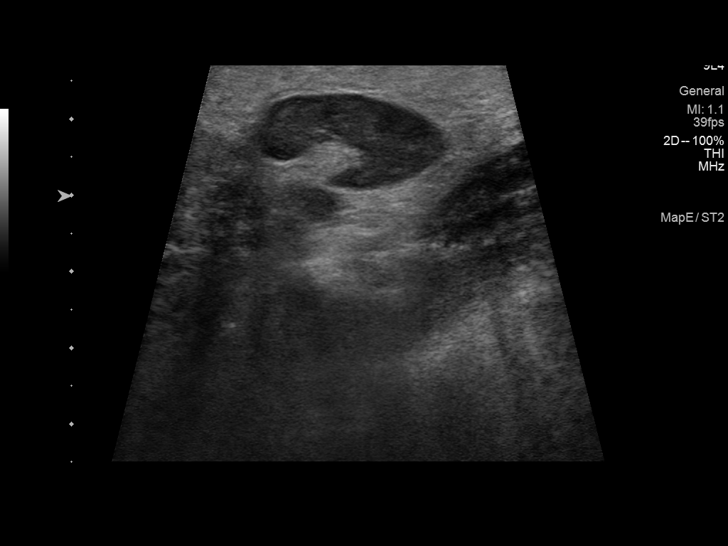
[im 37/37]
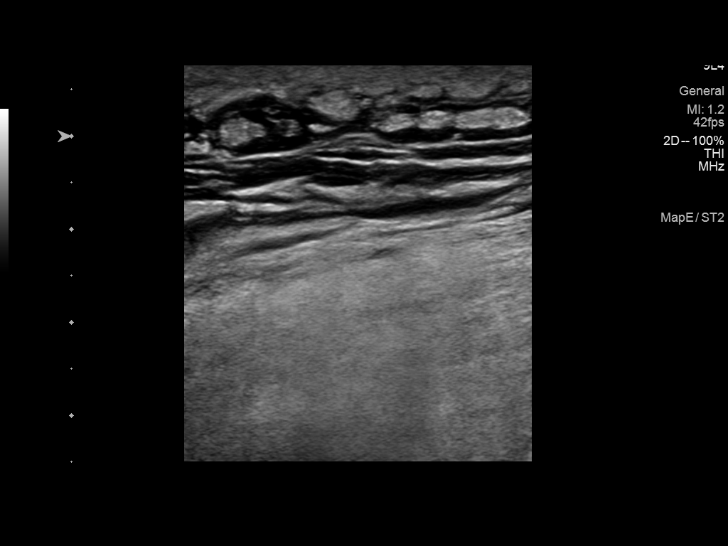

[13 of 24 positions shown; findings below may reference images not displayed]

FINDINGS: Contralateral Common Femoral Vein: Respiratory phasicity is normal
and symmetric with the symptomatic side. No evidence of thrombus.
Normal compressibility.

Common Femoral Vein: No evidence of thrombus. Normal
compressibility, respiratory phasicity and response to augmentation.

Saphenofemoral Junction: No evidence of thrombus. Normal
compressibility and flow on color Doppler imaging.

Profunda Femoral Vein: No evidence of thrombus. Normal
compressibility and flow on color Doppler imaging.

Femoral Vein: No evidence of thrombus. Normal compressibility,
respiratory phasicity and response to augmentation.

Popliteal Vein: No evidence of thrombus. Normal compressibility,
respiratory phasicity and response to augmentation.

Calf Veins: Limited evaluation. Where visualized normal color flow
on color Doppler imaging.

Other Findings: Prominent lymph nodes noted in the left groin. Exam
was limited due to patient body habitus and lower extremity edema.
IMPRESSION: 1.  No evidence of deep venous thrombosis.

2. Prominent lymph nodes in the groin. Exam was limited due to
patient's body habitus and lower extremity edema.

## 2019-09-11 IMAGING — CR CHEST - 2 VIEW
1 series · 2 of 2 positions shown · non-contrast
Comparison: 01/13/2018

CLINICAL DATA: Smoker, EMS with c/o of bilateral leg swelling that
has increased since having left great toe amputated 2 weeks ago. Pt
also has c/o of dry cough and generalized body achesPt has had
numerous heart surgeries in the past

EXAM:
CHEST - 2 VIEW

[Series 1: dg chest 2 view · 0.14mm/px · 2 of 2 slices shown]
[im 1/2]
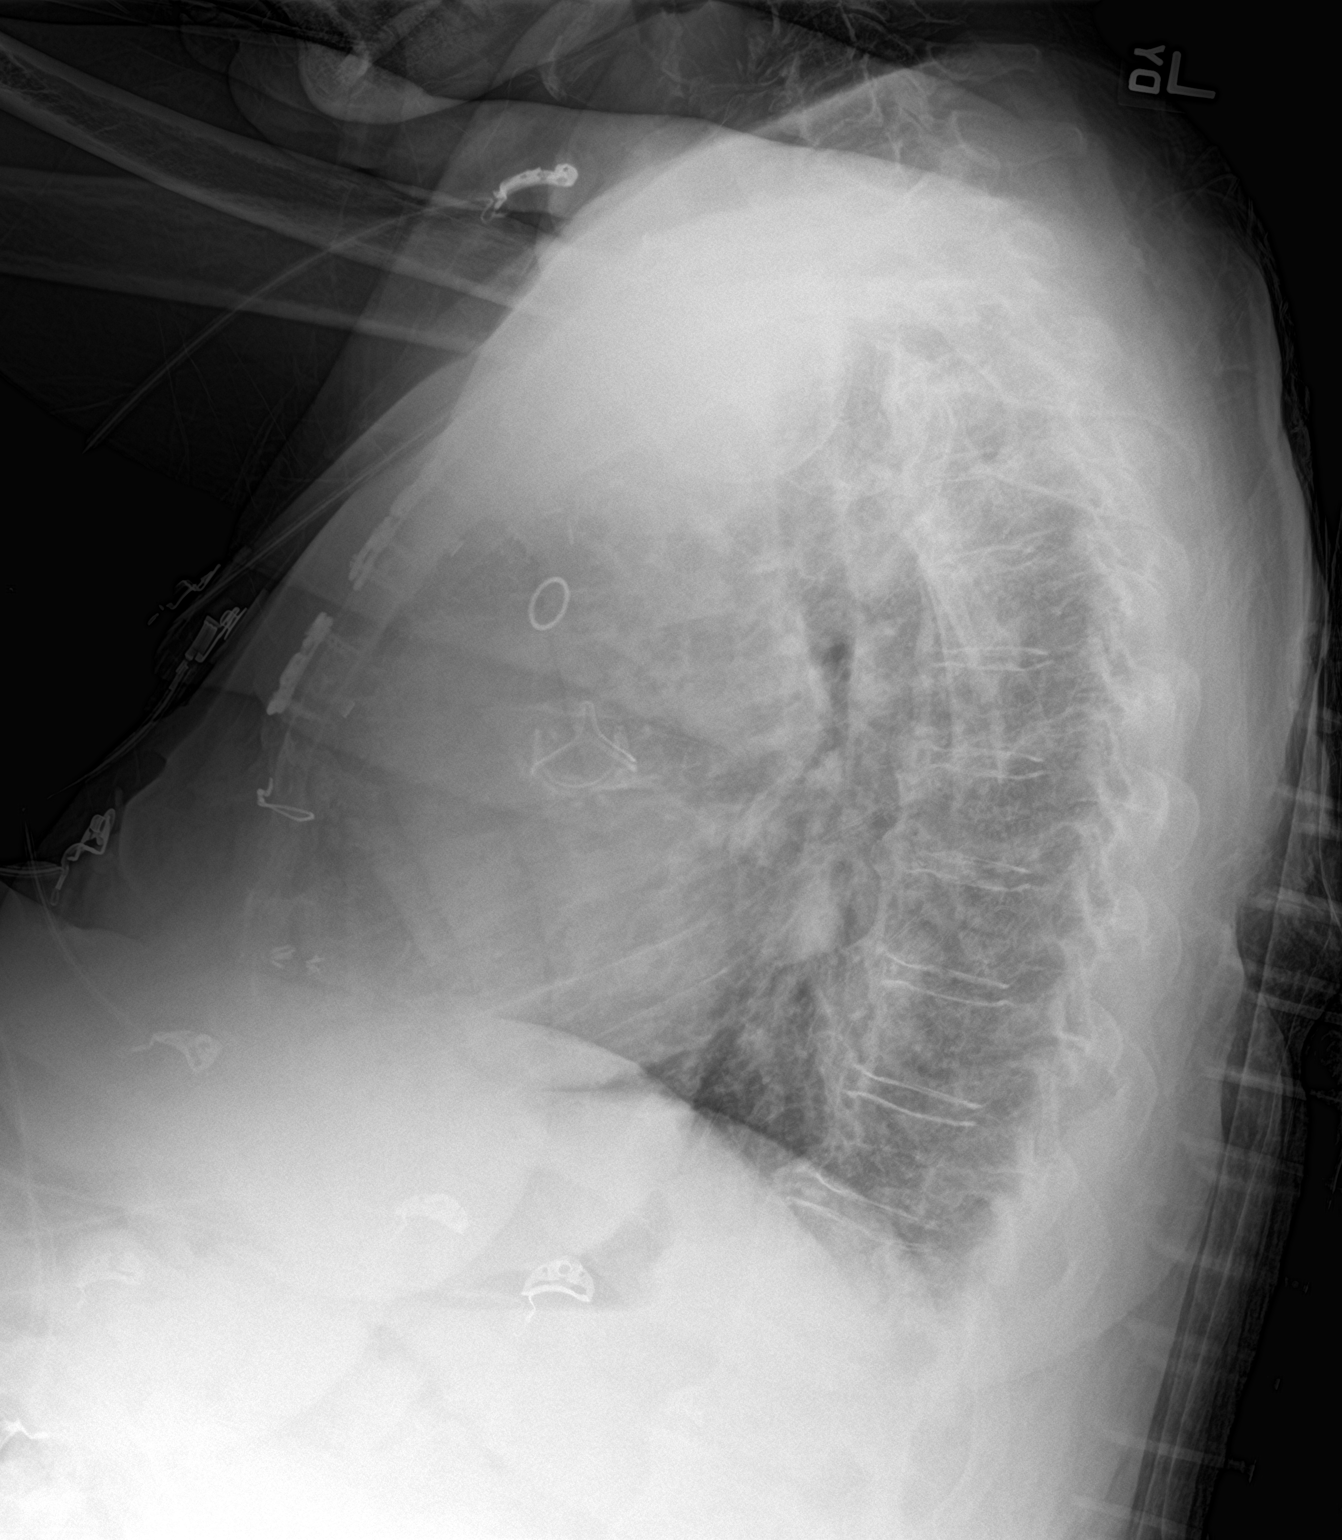
[im 2/2]
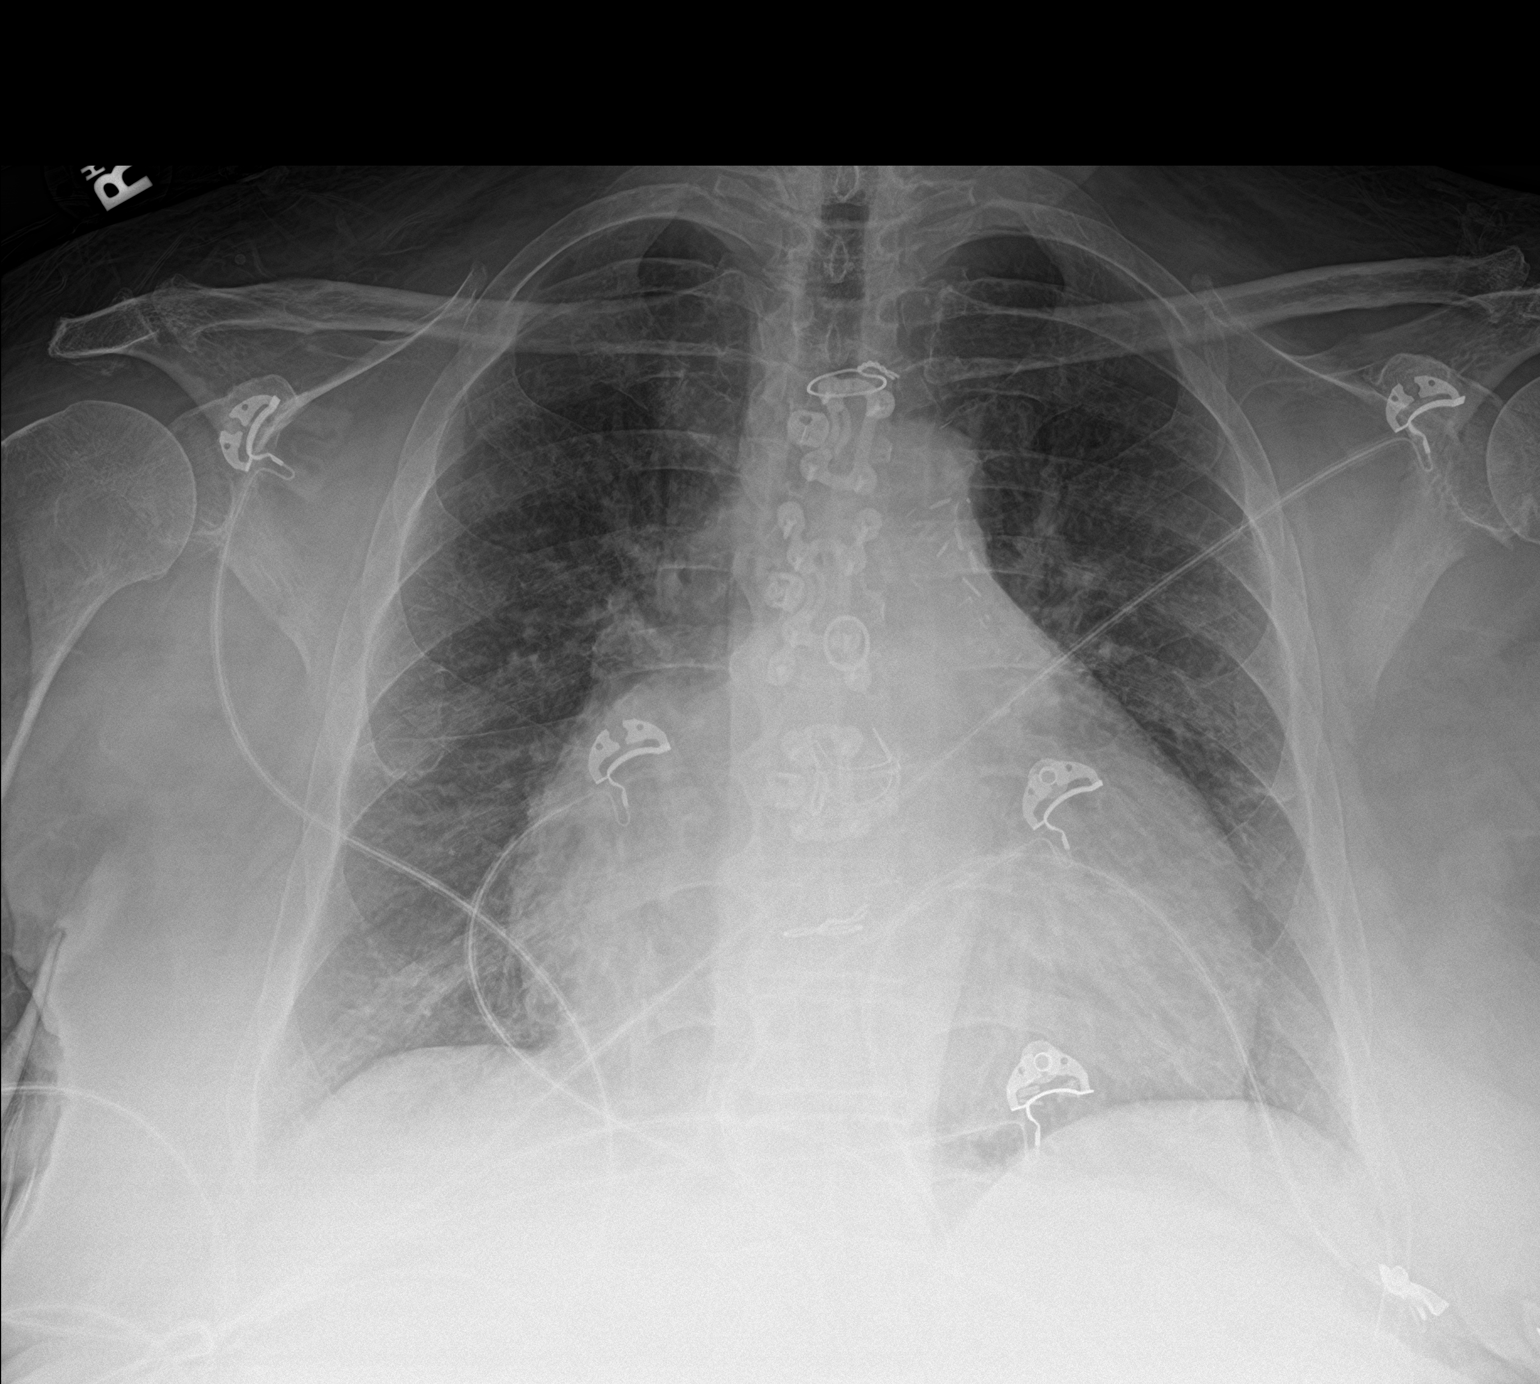

[2 of 2 positions shown; findings below may reference images not displayed]

FINDINGS: Moderate enlargement of the cardiopericardial silhouette. There are
changes from prior cardiac surgery and aortic valve replacement. No
mediastinal or hilar masses. No evidence of adenopathy.

Mild prominence of the bronchovascular and interstitial markings,
similar to prior studies. No convincing pulmonary edema. No evidence
of pneumonia. No pleural effusion or pneumothorax.

Skeletal structures are demineralized but intact.
IMPRESSION: 1. No acute cardiopulmonary disease.
2. Moderate cardiomegaly with stable changes from prior cardiac
surgery.

## 2019-09-12 IMAGING — CT CT HEAD WITHOUT CONTRAST
3 of 4 series · 16 of 47 positions shown, 19 images · non-contrast
Comparison: 12/18/2016

CLINICAL DATA: Increased lethargy today.  Confused.

EXAM:
CT HEAD WITHOUT CONTRAST
TECHNIQUE: Contiguous axial images were obtained from the base of the skull
through the vertex without intravenous contrast.

[Series 3: head wo · axial · 0.40mm/px · z∈[+511,+631]mm · 10 of 30 slices shown, 13 images]
[im 3/30  brain]
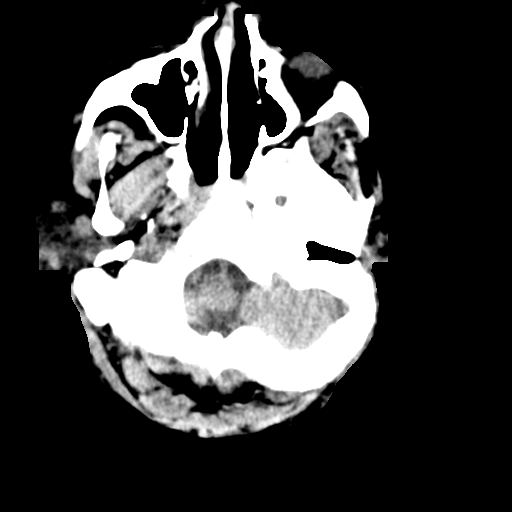
[im 3/30  bone]
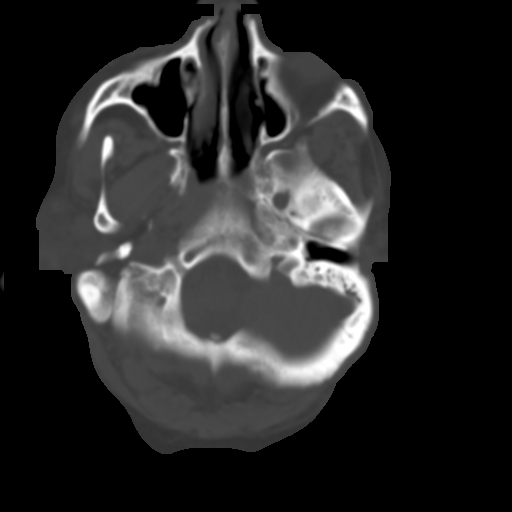
[im 5/30  brain]
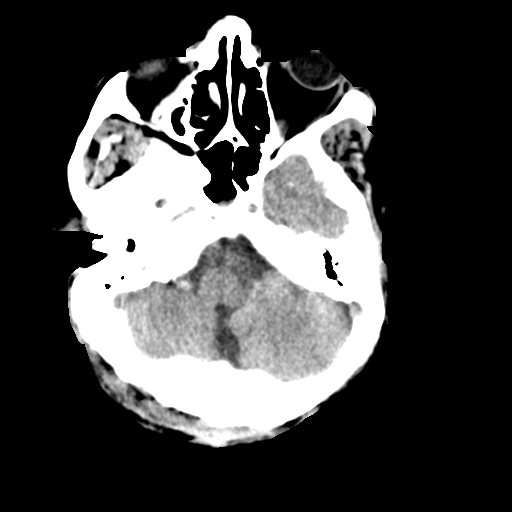
[im 9/30  brain]
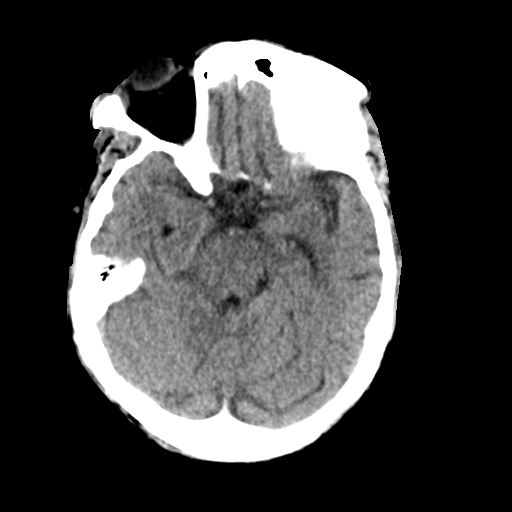
[im 11/30  brain]
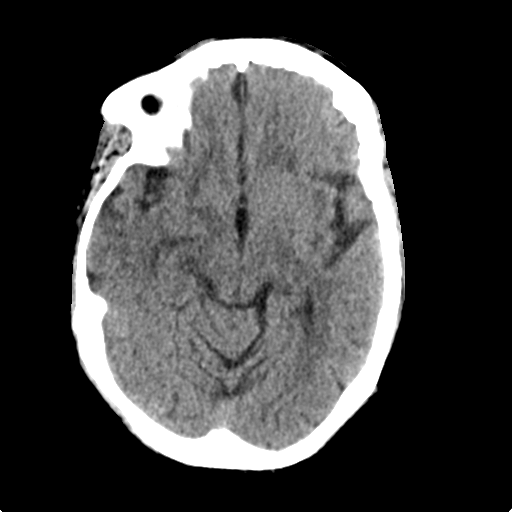
[im 13/30  brain]
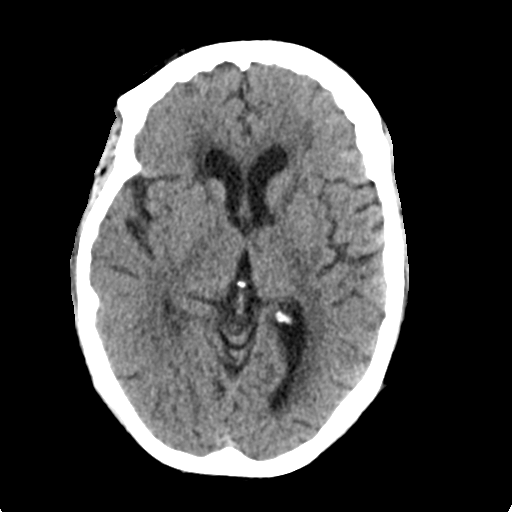
[im 13/30  bone]
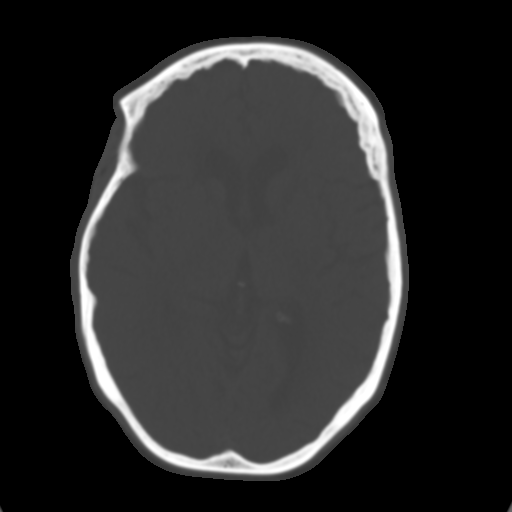
[im 17/30  brain]
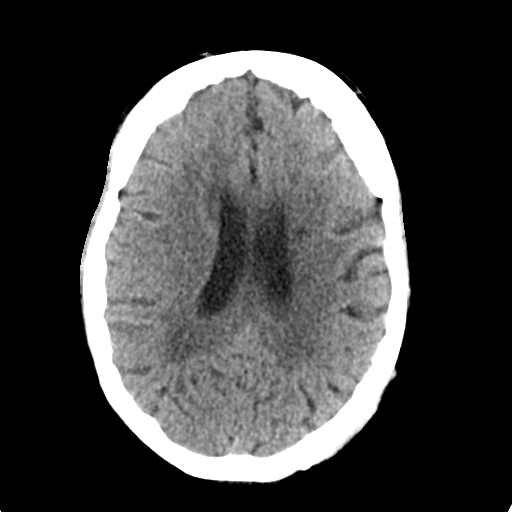
[im 19/30  brain]
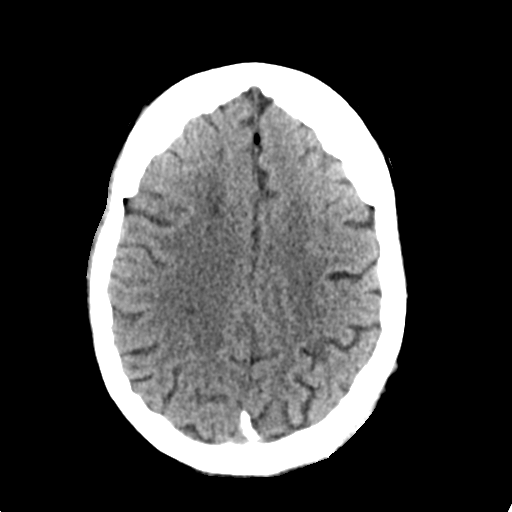
[im 21/30  brain]
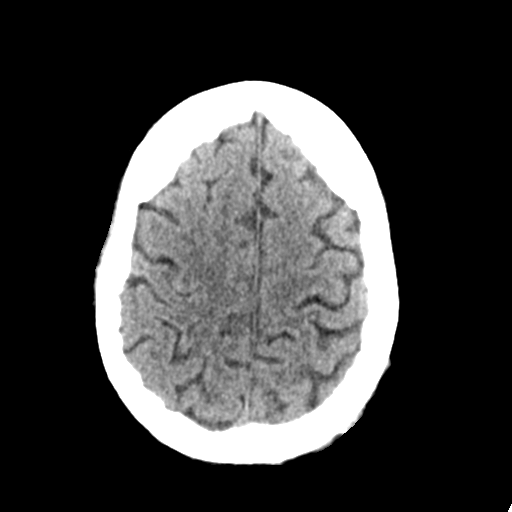
[im 25/30  brain]
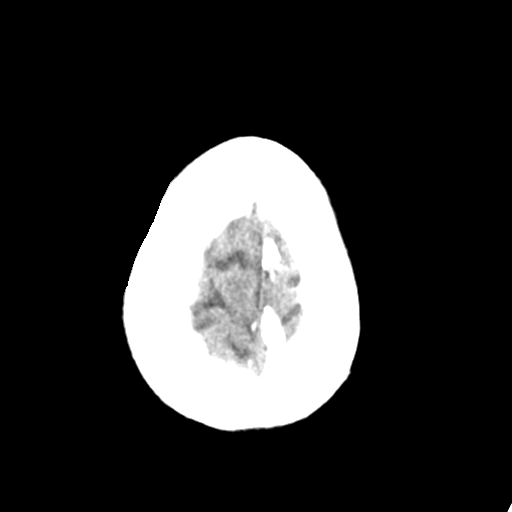
[im 25/30  bone]
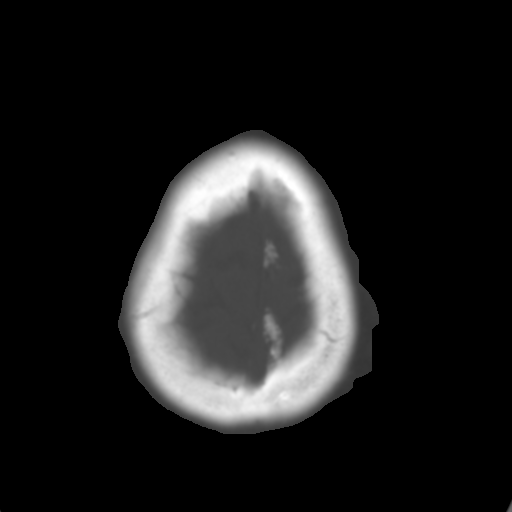
[im 27/30  brain]
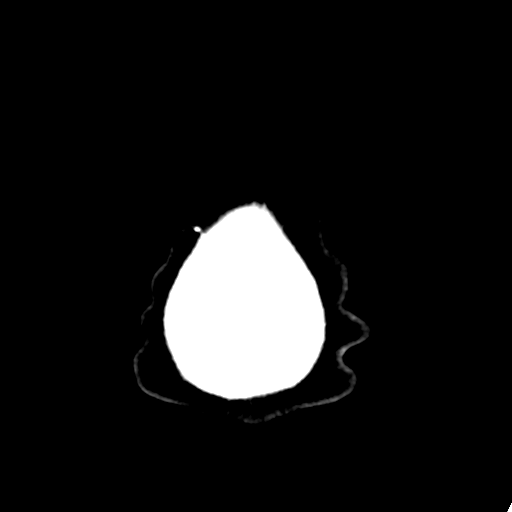

[Series 4: coronal soft tissue · coronal · 0.28mm/px · 3 of 62 slices shown]
[im 23/62  brain]
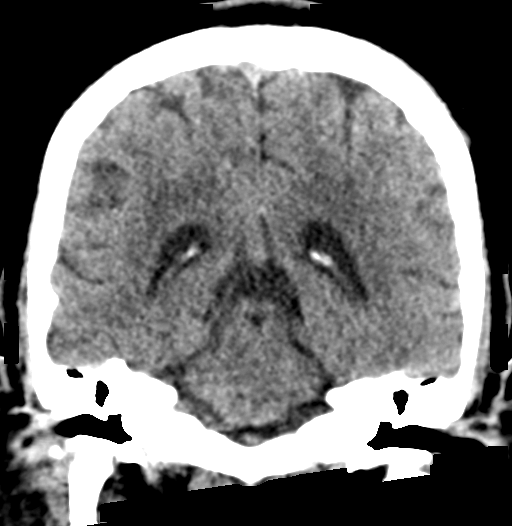
[im 28/62  brain]
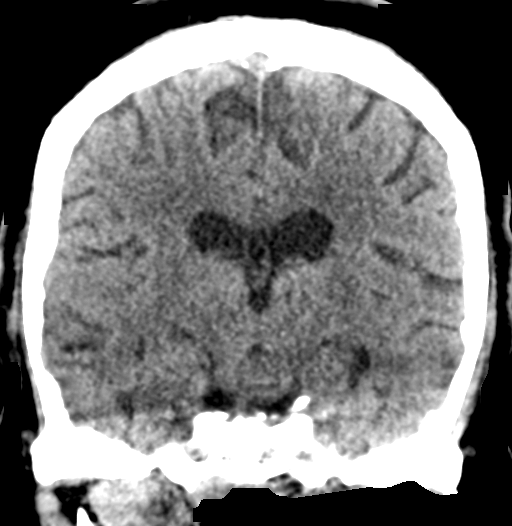
[im 34/62  brain]
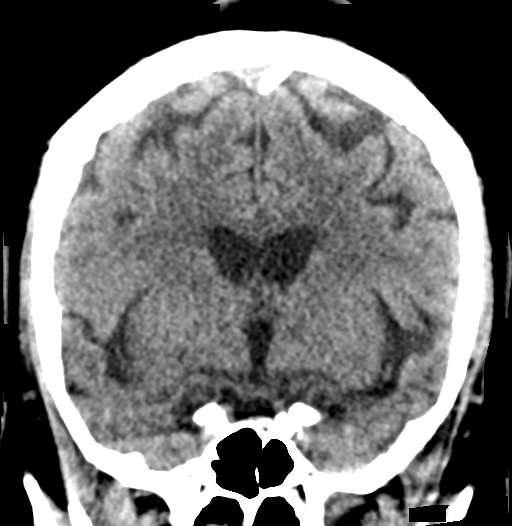

[Series 5: sagittal soft tissue · sagittal · 0.29mm/px · 3 of 49 slices shown]
[im 17/49  brain]
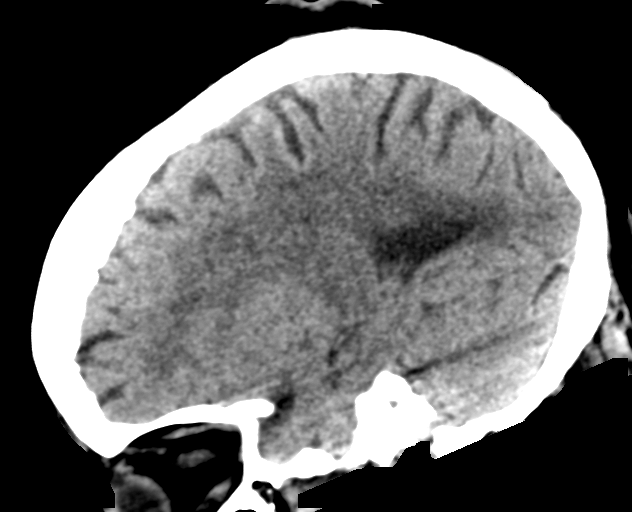
[im 25/49  brain]
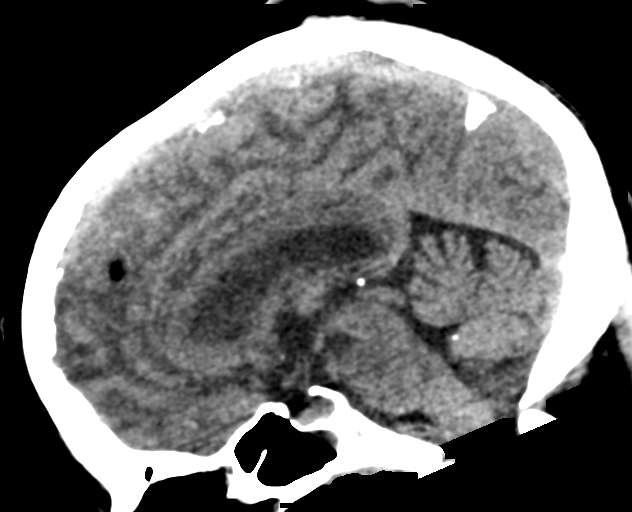
[im 32/49  brain]
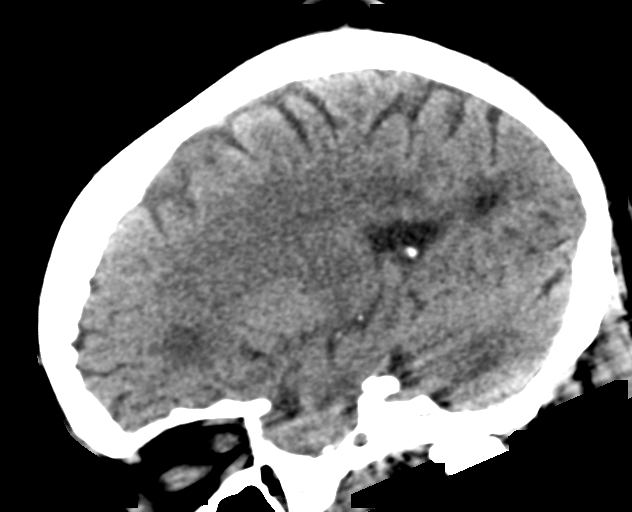

[16 of 47 positions shown; findings below may reference images not displayed]

FINDINGS: Brain: No evidence of acute infarction, hemorrhage, hydrocephalus,
extra-axial collection or mass lesion/mass effect.

Patchy areas of white matter hypoattenuation are noted consistent
with mild to moderate chronic microvascular ischemic change. Old
lacunar infarct near the genu of the left internal capsule. Old
white matter lacunar infarct in the right parietal lobe.

Vascular: No hyperdense vessel or unexpected calcification.

Skull: Normal. Negative for fracture or focal lesion.

Sinuses/Orbits: Globes and orbits are unremarkable. Visualized
sinuses and mastoid air cells are essentially clear.

Other: None.
IMPRESSION: 1. No acute intracranial abnormalities.
2. Mild to moderate chronic microvascular ischemic change. Small old
lacunar infarcts.

## 2019-09-18 NOTE — Code Documentation (Addendum)
2228 Code called, compressions started by Karsten Ro, RN with critical care MD and RT at bedside  Bradycardiac 30s to PEA   2230 Epi administered  10 mL Flush   2231 Pulse check PEA  Compressions resumed   2232 ROSC  Compression stopped  131/68 (88) P 124 SPO2 71%

## 2019-09-18 NOTE — Progress Notes (Signed)
PCCM Interval Note  Patient developed bradycardia followed by asystole x 2 requiring CPR with ROSC. Husband contacted via telephone regarding her worsening condition. After discussion regarding goals of care, he would like to change her code status to DNR. Will continue current medical management.  Rodman Pickle, M.D. [facility] 23-Aug-2019 10:46 PM

## 2019-09-18 NOTE — ED Provider Notes (Signed)
CODE BLUE note:  Patient developed bradycardia and then went to asystole.  CODE BLUE was called.  On arrival, CPR was in progress.  She received 3 mg of epinephrine as well as 1 amp of sodium bicarbonate with return of pulses.  Monitor showed atrial fibrillation with rapid ventricular response of about 200bpm.  She was cardioverted, and rhythm changed to sinus tachycardia with improved pulses.  Cardiopulmonary Resuscitation (CPR) Procedure Note Directed/Performed by: Delora Fuel I personally directed ancillary staff and/or performed CPR in an effort to regain return of spontaneous circulation and to maintain cardiac, neuro and systemic perfusion.   .Cardioversion  Date/Time: September 12, 2019 10:22 PM Performed by: Delora Fuel, MD Authorized by: Delora Fuel, MD   Consent:    Consent obtained:  Emergent situation Pre-procedure details:    Cardioversion basis:  Emergent   Rhythm:  Atrial fibrillation   Electrode placement:  Anterior-lateral Patient sedated: No Attempt one:    Cardioversion mode:  Synchronous   Waveform:  Biphasic   Shock (Joules):  150   Shock outcome:  Conversion to normal sinus rhythm Post-procedure details:    Patient status:  Unresponsive   Patient tolerance of procedure:  Tolerated well, no immediate complications      Delora Fuel, MD 06/77/03 2223

## 2019-09-18 NOTE — ED Notes (Signed)
Admitting paged x3 per RN woodys request

## 2019-09-18 NOTE — Progress Notes (Signed)
RT NOTES: Transported patient to CT and back to room ED-7 on bipap without complications.

## 2019-09-18 NOTE — ED Provider Notes (Signed)
Patient developed extreme bradycardia going to asystole and was pronounced dead at 10:56 PM.  This is clearly a medical death, and not a medical examiner's case.   Delora Fuel, MD 70/96/28 2257

## 2019-09-18 NOTE — Progress Notes (Signed)
Pharmacy Antibiotic Note  [name] is a 64 y.o. female admitted on 09-01-19 with respiratory failure, concern for aspiration pna.  Pharmacy has been consulted for Unasyn dosing.  Plan: Unasyn 3g IV every 12 hours Monitor renal function, clinical progression and LOT     Temp (24hrs), Avg:98.3 F (36.8 C), Min:98.3 F (36.8 C), Max:98.3 F (36.8 C)  Recent Labs  Lab 09-01-2019 1736 01-Sep-2019 1800  WBC 7.3  --   CREATININE 2.41*  --   LATICACIDVEN  --  1.0    Estimated Creatinine Clearance: 25.8 mL/min (A) (by C-G formula based on SCr of 2.41 mg/dL (H)).    Allergies  Allergen Reactions  . Latex Anaphylaxis and Rash  . Gabapentin Nausea And Vomiting and Other (See Comments)    Bertis Ruddy, PharmD Clinical Pharmacist ED Pharmacist Phone # [phone] 2019-09-01 9:40 PM

## 2019-09-18 NOTE — H&P (Signed)
NAME:  [name], MRN:  [id number], DOB:  1955/02/12, LOS: 0 ADMISSION DATE:  Sep 18, 2019, CONSULTATION DATE:  09/18/19 REFERRING MD:  Delora Fuel CHIEF COMPLAINT: Acute hypercarbic/hypoxic respiratory failure  Brief History   65 yo female with PMH significant for DM, type II, CAD, chronic systolic heart failure, PVD, COPD admitted from SNF with acute hypercarbic/hypoxic respiratory failure resulting in mechanical ventilation secondary to acute systolic heart failure  History of present illness   Patient with multiple admissions this year secondary to encephalopathy, cellulitis, group c bacteremia and anasarca.  Today, she presents from SNF after being found altered.  Per notes, patient was alert and answering questions yesterday. Today, she was found to be non verbal, responding to painful stimuli. VS on presentation: temp 98.3, HR: 108, RR: 22, BP: 118/81 with SpO2 98%.  Labs notable for ABG: 7.17/80/93/32, Glucose 211, BUN: 53, Creatinine 2.41.  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  CXR with bilateral pleural effusions. She failed Bipap and ultimately required intubation. She received Etomidate and Succinylcholine at 1955 for intubation, 1 L IVF, and started on fentanyl drip for ETT tolerance. She remains afebrile with normal WBC.   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  8/3: EKG: ST with RBBB  Micro Data:  8/3 COVID:   Antimicrobials:  Unasyn 8/3 >>  Interim history/subjective:  New admit, See above  Objective   Blood pressure (!) 87/64,  pulse 99, temperature 98.3 F (36.8 C), temperature source Rectal, resp. rate (!) 22, SpO2 100 %.    Vent Mode: PRVC FiO2 (%):  [100 %] 100 % Set Rate:  [22 bmp] 22 bmp Vt Set:  [450 mL] 450 mL PEEP:  [5 cmH20] 5 cmH20 Plateau Pressure:  [20 cmH20] 20 cmH20  No intake or output data in the 24 hours ending 2019-09-18 2002 There were no vitals filed for this visit.  Examination: General: Chronically ill female, intubated HENT: AT/[place] Lungs: Bilateral crackles and rhonchi Cardiovascular: warm and well perfused, L LE + edema.  R AKA Abdomen: Obese, + hiatal hernia, soft, non distended.  Neuro: right pupil 4 and sluggish, left pupil pin point.  + cough/gag. Not following commands or tracking.   Resolved Hospital Problem list     Assessment & Plan:  Acute hypercarbic respiratory failure Bilateral pleural effusions H/o COPD (no PFTs available) OSA --intubated 8/3 Plan --Episode of hypotension, hold diuresis for now.  Re-assess in am --empiric aspiration coverage with Unasyn --COVID pending --Continue full vent support with lung protective strategies  Acute kidney injury in setting of CKD stage IIIb --Baseline creatinine 1.95 Plan: --Continue supportive care, patient appears volume overloaded. Hold on further IVF   Acute on chronic systolic heart failure --Hold home metoprolol tonight --ECHO 6/10 with LVEF 40-45%. Moderately reduced RV function.  --Consider diuresis in am  Transient hypotension --SBP 80's post intubation Plan: --Currently MAPs > 65, continue to trend  DM type II Plan:  --Start SSI  Acute encephalopathy, toxic metabolic --Last known normal 16 hours prior to admission.   --Baseline: Patient interactive  --CTH without acute intracranial  process. Ammonia level 61.  Etiology likely hypercarbia Plan: --Continue fentanyl drip for ETT tolerance, RASS goal -1.  Low dose propofol for ETT tolerance added.   R AKA 07/08/15  HLD --Continue home statin   H/o  cirrhosis, HCV, Hepatic encephalopathy --resume home lactulose in am  Best practice:  Diet: NPO Pain/Anxiety/Delirium protocol (if indicated): Fentanyl Drip  VAP protocol (if indicated): In place DVT prophylaxis: Heparin subq GI prophylaxis: Pepcid Glucose control: SSI Mobility: Bedrest Code Status: FULL Family Communication: Husband NOK. Will attempt to contact and update  Disposition: ICU. Plan of care reviewed with Dr Loanne Drilling.   Labs   CBC: Recent Labs  Lab 08/28/2019 1736 08/28/19 1754 08/28/2019 1917  WBC 7.3  --   --   NEUTROABS 5.7  --   --   HGB 10.8* 12.9 11.6*  HCT 41.2 38.0 34.0*  MCV 102.0*  --   --   PLT 134*  --   --     Basic Metabolic Panel: Recent Labs  Lab 08-28-19 1736 08/28/2019 1754 Aug 28, 2019 1917  NA 143 146* 146*  K 4.8 4.9 4.6  CL 105  --   --   CO2 25  --   --   GLUCOSE 211*  --   --   BUN 53*  --   --   CREATININE 2.41*  --   --   CALCIUM 8.9  --   --    GFR: Estimated Creatinine Clearance: 25.8 mL/min (A) (by C-G formula based on SCr of 2.41 mg/dL (H)). Recent Labs  Lab 08/28/2019 1736 28-Aug-2019 1800  WBC 7.3  --   LATICACIDVEN  --  1.0    Liver Function Tests: Recent Labs  Lab 08-28-2019 1736  AST 15  ALT 10  ALKPHOS 96  BILITOT 0.9  PROT 8.6*  ALBUMIN 2.7*   No results for input(s): LIPASE, AMYLASE in the last 168 hours. Recent Labs  Lab 2019/08/28 1800  AMMONIA 61*    ABG    Component Value Date/Time   PHART 7.226 (L) Aug 28, 2019 1917   PCO2ART 69.4 (HH) Aug 28, 2019 1917   PO2ART 86 2019/08/28 1917   HCO3 28.9 (H) 08-28-2019 1917   TCO2 31 08-28-19 1917   ACIDBASEDEF 1.0 2019-08-28 1754   O2SAT 94.0 08-28-2019 1917     Coagulation Profile: No results for input(s): INR, PROTIME in the last 168 hours.  Cardiac Enzymes: No results for input(s): CKTOTAL, CKMB, CKMBINDEX, TROPONINI in the last 168 hours.  HbA1C: Hgb A1c MFr Bld  Date/Time Value Ref Range Status  05/27/2019 05:30 AM 7.4 (H) 4.8 - 5.6 % Final     Comment:    (NOTE) Pre diabetes:          5.7%-6.4% Diabetes:              >6.4% Glycemic control for   <7.0% adults with diabetes   05/08/2019 05:08 AM 6.8 (H) 4.8 - 5.6 % Final    Comment:    (NOTE) Pre diabetes:          5.7%-6.4% Diabetes:              >6.4% Glycemic control for   <7.0% adults with diabetes     CBG: Recent Labs  Lab 08/28/19 1733  GLUCAP 193*    Review of Systems:   Unable to obtain due to clinical status  Past Medical History  She,  has a past medical history of Cervical cancer ([place]), CHF (congestive heart failure) ([place]), Chronic anemia,  COPD (chronic obstructive pulmonary disease) ([place]), Diabetes mellitus without complication ([place]), Gastric ulcer, NSTEMI (non-ST elevated myocardial infarction) ([place]), and Upper GI bleed.   Surgical History    Past Surgical History:  Procedure Laterality Date  . AMPUTATION Left 06/24/2018   Procedure: AMPUTATION GREAT TOE;  Surgeon: Samara Deist, DPM;  Location: [facility];  Service: Podiatry;  Laterality: Left;  . AMPUTATION Right 07/04/2019   Procedure: RIGHT AMPUTATION ABOVE KNEE;  Surgeon: [name], MD;  Location: [place];  Service: Vascular;  Laterality: Right;  . APPENDECTOMY    . BIOPSY  11/22/2018   Procedure: BIOPSY;  Surgeon: Thornton Park, MD;  Location: [place];  Service: Gastroenterology;;  . CARDIAC CATHETERIZATION    . CHOLECYSTECTOMY    . COLONOSCOPY N/A 09/28/2017   Procedure: COLONOSCOPY;  Surgeon: Toledo, Benay Pike, MD;  Location: [facility];  Service: Gastroenterology;  Laterality: N/A;  . COLONOSCOPY WITH PROPOFOL N/A 08/21/2018   Procedure: COLONOSCOPY WITH PROPOFOL;  Surgeon: Lin Landsman, MD;  Location: [facility]COPY;  Service: Gastroenterology;  Laterality: N/A;  . COLONOSCOPY WITH PROPOFOL N/A 12/28/2018   Procedure: COLONOSCOPY WITH PROPOFOL;  Surgeon: Rush Landmark Telford Nab., MD;  Location: [facility];  Service: Gastroenterology;  Laterality: N/A;  .  ENTEROSCOPY Left 11/06/2017   Procedure: ENTEROSCOPY;  Surgeon: Jonathon Bellows, MD;  Location: [facility] END[facility]COPY;  Service: Gastroenterology;  Laterality: Left;  . ENTEROSCOPY N/A 06/22/2018   Procedure: ENTEROSCOPY;  Surgeon: Lin Landsman, MD;  Location: [facility] END[facility]COPY;  Service: Gastroenterology;  Laterality: N/A;  . ENTEROSCOPY N/A 08/18/2018   Procedure: ENTEROSCOPY;  Surgeon: Jonathon Bellows, MD;  Location: [facility] END[facility]COPY;  Service: Gastroenterology;  Laterality: N/A;  . ENTEROSCOPY N/A 11/01/2018   Procedure: ENTEROSCOPY;  Surgeon: Virgel Manifold, MD;  Location: [facility];  Service: Endoscopy;  Laterality: N/A;  . ENTEROSCOPY N/A 11/22/2018   Procedure: ENTEROSCOPY;  Surgeon: Thornton Park, MD;  Location: [place];  Service: Gastroenterology;  Laterality: N/A;  . ENTEROSCOPY Left 12/17/2018   Procedure: ENTEROSCOPY;  Surgeon: Yetta Flock, MD;  Location: [facility];  Service: Gastroenterology;  Laterality: Left;  . ENTEROSCOPY N/A 12/19/2018   Procedure: ENTEROSCOPY;  Surgeon: Yetta Flock, MD;  Location: [facility];  Service: Gastroenterology;  Laterality: N/A;  . ENTEROSCOPY N/A 12/20/2018   Procedure: ENTEROSCOPY;  Surgeon: Lavena Bullion, DO;  Location: [facility];  Service: Gastroenterology;  Laterality: N/A;  . ESOPHAGOGASTRODUODENOSCOPY N/A 12/19/2016   Procedure: ESOPHAGOGASTRODUODENOSCOPY (EGD);  Surgeon: Lin Landsman, MD;  Location: [facility]COPY;  Service: Gastroenterology;  Laterality: N/A;  . ESOPHAGOGASTRODUODENOSCOPY N/A 09/28/2017   Procedure: ESOPHAGOGASTRODUODENOSCOPY (EGD);  Surgeon: Toledo, Benay Pike, MD;  Location: [facility];  Service: Gastroenterology;  Laterality: N/A;  . ESOPHAGOGASTRODUODENOSCOPY (EGD) WITH PROPOFOL N/A 08/08/2017   Procedure: ESOPHAGOGASTRODUODENOSCOPY (EGD) WITH PROPOFOL;  Surgeon: [name], MD;  Location: [facility] ENDOSCOPY;  Service: Endoscopy;  Laterality: N/A;  . GIVENS CAPSULE STUDY N/A  10/28/2018   Procedure: GIVENS CAPSULE STUDY;  Surgeon: Jonathon Bellows, MD;  Location: [facility] ENDOSCOPY;  Service: Gastroenterology;  Laterality: N/A;  . GIVENS CAPSULE STUDY N/A 12/26/2018   Procedure: GIVENS CAPSULE STUDY;  Surgeon: Rush Landmark Telford Nab., MD;  Location: [facility];  Service: Gastroenterology;  Laterality: N/A;  . heart [place]    . HEMOSTASIS CLIP PLACEMENT  12/19/2018   Procedure: HEMOSTASIS CLIP PLACEMENT;  Surgeon: Yetta Flock, MD;  Location: [facility];  Service: Gastroenterology;;  . HEMOSTASIS CLIP PLACEMENT  12/28/2018   Procedure: HEMOSTASIS CLIP PLACEMENT;  Surgeon: Rush Landmark,  Telford Nab., MD;  Location: [facility];  Service: Gastroenterology;;  . HOT HEMOSTASIS N/A 12/17/2018   Procedure: HOT HEMOSTASIS (ARGON PLASMA COAGULATION/BICAP);  Surgeon: Yetta Flock, MD;  Location: [facility];  Service: Gastroenterology;  Laterality: N/A;  . HOT HEMOSTASIS N/A 12/28/2018   Procedure: HOT HEMOSTASIS (ARGON PLASMA COAGULATION/BICAP);  Surgeon: Irving Copas., MD;  Location: [facility];  Service: Gastroenterology;  Laterality: N/A;  . LEFT HEART CATH AND CORONARY ANGIOGRAPHY N/A 12/23/2016   Procedure: LEFT HEART CATH AND CORONARY ANGIOGRAPHY;  Surgeon: Teodoro Spray, MD;  Location: [place] CV LAB;  Service: Cardiovascular;  Laterality: N/A;  . LOWER EXTREMITY ANGIOGRAPHY Left 06/26/2018   Procedure: Lower Extremity Angiography;  Surgeon: Katha Cabal, MD;  Location: [place] CV [facility];  Service: Cardiovascular;  Laterality: Left;  . SUBMUCOSAL INJECTION  12/19/2018   Procedure: SUBMUCOSAL INJECTION;  Surgeon: Yetta Flock, MD;  Location: [facility];  Service: Gastroenterology;;  . Lia Foyer TATTOO INJECTION  12/17/2018   Procedure: SUBMUCOSAL TATTOO INJECTION;  Surgeon: Yetta Flock, MD;  Location: [facility];  Service: Gastroenterology;;  . Lia Foyer TATTOO INJECTION  12/28/2018   Procedure:  SUBMUCOSAL TATTOO INJECTION;  Surgeon: Irving Copas., MD;  Location: [facility];  Service: Gastroenterology;;  . TEE WITHOUT CARDIOVERSION N/A 10/29/2018   Procedure: TRANSESOPHAGEAL ECHOCARDIOGRAM (TEE);  Surgeon: Teodoro Spray, MD;  Location: [facility];  Service: Cardiovascular;  Laterality: N/A;  . TEE WITHOUT CARDIOVERSION N/A 05/30/2019   Procedure: TRANSESOPHAGEAL ECHOCARDIOGRAM (TEE);  Surgeon: Teodoro Spray, MD;  Location: [facility];  Service: Cardiovascular;  Laterality: N/A;     Social History   reports that she has been smoking cigarettes. She has been smoking about 0.25 packs per day. She has never used smokeless tobacco. She reports that she does not drink alcohol and does not use drugs.   Family History   Her family history includes Breast cancer in her mother; Cancer in her sister; Diabetes Mellitus II in her mother; Heart disease in her father; Hypertension in her mother.   Allergies Allergies  Allergen Reactions  . Latex Anaphylaxis and Rash  . Gabapentin Nausea And Vomiting and Other (See Comments)     Home Medications  Prior to Admission medications   Medication Sig Start Date End Date Taking? Authorizing Provider  Amino Acids-Protein Hydrolys (FEEDING SUPPLEMENT, PRO-STAT SUGAR FREE 64,) LIQD Take 30 mLs by mouth 3 (three) times daily. Patient taking differently: Take 30 mLs by mouth 2 (two) times daily. At 0900 and 1200 07/26/19  Yes Kyle, Tyrone A, DO  apixaban (ELIQUIS) 5 MG TABS tablet Take 5 mg by mouth 2 (two) times daily. At 0900 and 2100   Yes [provider]  atorvastatin (LIPITOR) 40 MG tablet Take 40 mg by mouth daily.   Yes [provider]  fluticasone (FLONASE) 50 MCG/ACT nasal spray Place 2 sprays into both nostrils daily.   Yes [provider]  folic acid (FOLVITE) 1 MG tablet Take 1 tablet (1 mg total) by mouth daily. 08/21/18  Yes Ojie, Jude, MD  insulin aspart (NOVOLOG) 100 UNIT/ML injection Inject 2-15 Units into  the skin See admin instructions. 3 times daily per sliding scale: BG 121-150 = 2 units BG 151-200 = 3 units BG 201-250 = 5 units BG 251-300 = 6 units BG 301-350 = 11 units BG 351-400 = 15 units & call MD   Yes [provider]  lactulose (CHRONULAC) 10 GM/15ML solution Take 30 mLs (20 g total) by mouth  2 (two) times daily. Patient taking differently: Take 20 g by mouth 2 (two) times daily. At 0900 and 2100 07/26/19  Yes Kyle, Tyrone A, DO  metoprolol tartrate (LOPRESSOR) 50 MG tablet Take 50 mg by mouth 2 (two) times daily. At 0900 and 2100. Take with food   Yes [provider]  Multiple Vitamins-Minerals (CERTAVITE SENIOR PO) Take 1 tablet by mouth daily.   Yes [provider]  OXYGEN Inhale 3 L/min into the lungs continuous. Via nasal cannula   Yes [provider]  furosemide (LASIX) 40 MG tablet Take 1 tablet (40 mg total) by mouth daily. Patient not taking: Reported on 2019/09/12 07/27/19 08/26/19  Cherylann Ratel A, DO  Multiple Vitamin (MULTIVITAMIN WITH MINERALS) TABS tablet Take 1 tablet by mouth daily. Patient not taking: Reported on 09/12/2019 06/28/18   Lang Snow, NP     Critical care time: [place], ACNP [place] [facility]  After hours pager: [phone]

## 2019-09-18 NOTE — ED Provider Notes (Addendum)
[facility] EMERGENCY DEPARTMENT Provider Note   CSN: [id number] Arrival date & time: 09-02-2019  1717   History Chief Complaint  Patient presents with  . Altered Mental Status    [name] is a 64 y.o. female.  The history is provided by the nursing home. The history is limited by the condition of the patient (Altered mental status).  Altered Mental Status She has history of diabetes, coronary artery disease, peripheral vascular disease, COPD and was sent here from a skilled nursing facility because of mental status change.  Apparently, yesterday she was alert and able to answer questions and today she did not have any verbal response.  Patient is completely nonverbal on my evaluation.  Past Medical History:  Diagnosis Date  . Cervical cancer ([place])   . CHF (congestive heart failure) ([place])   . Chronic anemia   . COPD (chronic obstructive pulmonary disease) ([place])   . Diabetes mellitus without complication ([place])   . Gastric ulcer   . NSTEMI (non-ST elevated myocardial infarction) ([place])   . Upper GI bleed     Patient Active Problem List   Diagnosis Date Noted  . S/P AKA (above knee amputation) ([place]) 08/16/2019  . Palliative care encounter   . DNR (do not resuscitate)   . Respiratory acidosis   . Hyperkalemia   . Advanced care planning/counseling discussion   . Chronic systolic CHF (congestive heart failure) ([place]) 06/27/2019  . Venous stasis ulcer ([place]) 06/27/2019  . Type 2 diabetes mellitus with stage 3b chronic kidney disease, with long-term current use of insulin ([place])   . DNR (do not resuscitate) discussion   . Acute metabolic encephalopathy   . Hypotension   . Acute kidney injury superimposed on CKD ([place])   . Goals of care, counseling/discussion   . Palliative care by specialist   . Anasarca 05/26/2019  . Cellulitis 05/26/2019  . Diabetic foot ulcer ([place]) 05/26/2019  . Decreased level of consciousness 05/26/2019  . Moderate mitral  regurgitation by prior echocardiogram 05/04/2019  . Moderate - severe tricuspid regurgitation by prior echocardiogram 05/04/2019  . CKD stage 3 due to type 2 diabetes mellitus ([place]) 05/04/2019  . HCV antibody positive 05/04/2019  . Other cirrhosis of liver ([place]) 05/04/2019  . Fall 02/24/2019  . UTI (urinary tract infection) 02/23/2019  . AVM (arteriovenous malformation) of small bowel, acquired with hemorrhage   . Epistaxis 11/29/2018  . Altered mental status   . Heme positive stool   . Gastritis and gastroduodenitis   . Encephalopathy acute   . Acute on chronic blood loss anemia 11/20/2018  . Pressure injury of skin 10/26/2018  . Anxiety 08/15/2018  . Chronic back pain 08/15/2018  . GERD (gastroesophageal reflux disease) 08/15/2018  . History of carpal tunnel syndrome 08/15/2018  . HNP (herniated nucleus pulposus), lumbar 08/15/2018  . Hyperlipidemia 08/15/2018  . Hypertension 08/15/2018  . Insomnia 08/15/2018  . Left ventricular hypertrophy 08/15/2018  . Obesity 08/15/2018  . Sleep apnea 08/15/2018  . Anemia of chronic disease 06/20/2018  . Diabetic polyneuropathy associated with type 2 diabetes mellitus ([place]) 02/23/2018  . Moderate tricuspid regurgitation 02/18/2018  . Acute on chronic diastolic CHF (congestive heart failure) ([place]) 02/08/2018  . 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 Depression 02/05/2018  . Generalized abdominal pain 02/05/2018  . CHF (congestive heart failure) ([place]) 01/13/2018  . B12 deficiency 12/07/2017  . Iron deficiency anemia due to chronic blood loss 12/07/2017  . Folate deficiency 12/07/2017  . Upper GI bleed 11/03/2017  .  COPD (chronic obstructive pulmonary disease) ([place]) 09/26/2017  . Symptomatic anemia 09/26/2017  . Anemia   . Weakness   . Lower GI bleeding 08/07/2017  . Chronic systolic heart failure ([place]) 12/28/2016  . Tobacco use 12/28/2016  . NSTEMI (non-ST elevated myocardial infarction) ([place])   . Acute hypoxemic respiratory  failure ([place]) 12/18/2016  . Sepsis ([place]) 12/18/2016  . CHF exacerbation ([place]) 12/04/2015  . PNA (pneumonia) 10/28/2015  . Chronic hepatitis C without hepatic coma ([place]) 05/21/2014  . CAD (coronary artery disease) 07/19/2013  . Right arm pain 06/26/2013  . Cervical pain (neck) 03/28/2013  . Radiculopathy affecting upper extremity 03/28/2013  . Impaired renal function 12/21/2012  . Abnormal mammogram 12/20/2012  . Diabetes mellitus type 2, uncontrolled ([place]) 12/20/2012    Past Surgical History:  Procedure Laterality Date  . AMPUTATION Left 06/24/2018   Procedure: AMPUTATION GREAT TOE;  Surgeon: Samara Deist, DPM;  Location: [facility];  Service: Podiatry;  Laterality: Left;  . AMPUTATION Right 07/04/2019   Procedure: RIGHT AMPUTATION ABOVE KNEE;  Surgeon: [name], MD;  Location: [facility];  Service: Vascular;  Laterality: Right;  . APPENDECTOMY    . BIOPSY  11/22/2018   Procedure: BIOPSY;  Surgeon: Thornton Park, MD;  Location: [place];  Service: Gastroenterology;;  . CARDIAC CATHETERIZATION    . CHOLECYSTECTOMY    . COLONOSCOPY N/A 09/28/2017   Procedure: COLONOSCOPY;  Surgeon: Toledo, Benay Pike, MD;  Location: [facility];  Service: Gastroenterology;  Laterality: N/A;  . COLONOSCOPY WITH PROPOFOL N/A 08/21/2018   Procedure: COLONOSCOPY WITH PROPOFOL;  Surgeon: Lin Landsman, MD;  Location: [facility]COPY;  Service: Gastroenterology;  Laterality: N/A;  . COLONOSCOPY WITH PROPOFOL N/A 12/28/2018   Procedure: COLONOSCOPY WITH PROPOFOL;  Surgeon: Rush Landmark Telford Nab., MD;  Location: [facility];  Service: Gastroenterology;  Laterality: N/A;  . ENTEROSCOPY Left 11/06/2017   Procedure: ENTEROSCOPY;  Surgeon: Jonathon Bellows, MD;  Location: [facility] ENDOSCOPY;  Service: Gastroenterology;  Laterality: Left;  . ENTEROSCOPY N/A 06/22/2018   Procedure: ENTEROSCOPY;  Surgeon: Lin Landsman, MD;  Location: [facility] ENDOSCOPY;  Service: Gastroenterology;  Laterality: N/A;  .  ENTEROSCOPY N/A 08/18/2018   Procedure: ENTEROSCOPY;  Surgeon: Jonathon Bellows, MD;  Location: [facility] ENDOSCOPY;  Service: Gastroenterology;  Laterality: N/A;  . ENTEROSCOPY N/A 11/01/2018   Procedure: ENTEROSCOPY;  Surgeon: Virgel Manifold, MD;  Location: [facility] ENDOSCOPY;  Service: Endoscopy;  Laterality: N/A;  . ENTEROSCOPY N/A 11/22/2018   Procedure: ENTEROSCOPY;  Surgeon: Thornton Park, MD;  Location: [place];  Service: Gastroenterology;  Laterality: N/A;  . ENTEROSCOPY Left 12/17/2018   Procedure: ENTEROSCOPY;  Surgeon: Yetta Flock, MD;  Location: [facility];  Service: Gastroenterology;  Laterality: Left;  . ENTEROSCOPY N/A 12/19/2018   Procedure: ENTEROSCOPY;  Surgeon: Yetta Flock, MD;  Location: [facility];  Service: Gastroenterology;  Laterality: N/A;  . ENTEROSCOPY N/A 12/20/2018   Procedure: ENTEROSCOPY;  Surgeon: Lavena Bullion, DO;  Location: [facility];  Service: Gastroenterology;  Laterality: N/A;  . ESOPHAGOGASTRODUODENOSCOPY N/A 12/19/2016   Procedure: ESOPHAGOGASTRODUODENOSCOPY (EGD);  Surgeon: Lin Landsman, MD;  Location: [facility] ENDOSCOPY;  Service: Gastroenterology;  Laterality: N/A;  . ESOPHAGOGASTRODUODENOSCOPY N/A 09/28/2017   Procedure: ESOPHAGOGASTRODUODENOSCOPY (EGD);  Surgeon: Toledo, Benay Pike, MD;  Location: [facility]OSCOPY;  Service: Gastroenterology;  Laterality: N/A;  . ESOPHAGOGASTRODUODENOSCOPY (EGD) WITH PROPOFOL N/A 08/08/2017   Procedure: ESOPHAGOGASTRODUODENOSCOPY (EGD) WITH PROPOFOL;  Surgeon: [name], MD;  Location: [facility] ENDOSCOPY;  Service: Endoscopy;  Laterality: N/A;  . GIVENS CAPSULE STUDY N/A 10/28/2018  Procedure: GIVENS CAPSULE STUDY;  Surgeon: Jonathon Bellows, MD;  Location: [facility] ENDOSCOPY;  Service: Gastroenterology;  Laterality: N/A;  . GIVENS CAPSULE STUDY N/A 12/26/2018   Procedure: GIVENS CAPSULE STUDY;  Surgeon: Rush Landmark Telford Nab., MD;  Location: [facility];  Service: Gastroenterology;  Laterality: N/A;    . [place]    . HEMOSTASIS CLIP PLACEMENT  12/19/2018   Procedure: HEMOSTASIS CLIP PLACEMENT;  Surgeon: Yetta Flock, MD;  [facility];  Service: Gastroenterology;;  . HEMOSTASIS CLIP PLACEMENT  12/28/2018   Procedure: HEMOSTASIS CLIP PLACEMENT;  Surgeon: Irving Copas., MD;  Location: [facility];  Service: Gastroenterology;;  . HOT HEMOSTASIS N/A 12/17/2018   Procedure: HOT HEMOSTASIS (ARGON PLASMA COAGULATION/BICAP);  Surgeon: Yetta Flock, MD;  Location: [facility];  Service: Gastroenterology;  Laterality: N/A;  . HOT HEMOSTASIS N/A 12/28/2018   Procedure: HOT HEMOSTASIS (ARGON PLASMA COAGULATION/BICAP);  Surgeon: Irving Copas., MD;  Location: [facility];  Service: Gastroenterology;  Laterality: N/A;  . LEFT HEART CATH AND CORONARY ANGIOGRAPHY N/A 12/23/2016   Procedure: LEFT HEART CATH AND CORONARY ANGIOGRAPHY;  Surgeon: Teodoro Spray, MD;  Location: [facility] [place] CV LAB;  Service: Cardiovascular;  Laterality: N/A;  . LOWER EXTREMITY ANGIOGRAPHY Left 06/26/2018   Procedure: Lower Extremity Angiography;  Surgeon: Katha Cabal, MD;  Location: [place] CV LAB;  Service: Cardiovascular;  Laterality: Left;  . SUBMUCOSAL INJECTION  12/19/2018   Procedure: SUBMUCOSAL INJECTION;  Surgeon: Yetta Flock, MD;  Location[facility];  Service: Gastroenterology;;  . Lia Foyer TATTOO INJECTION  12/17/2018   Procedure: SUBMUCOSAL TATTOO INJECTION;  Surgeon: Yetta Flock, MD;  Location: [facility];  Service: Gastroenterology;;  . Lia Foyer TATTOO INJECTION  12/28/2018   Procedure: SUBMUCOSAL TATTOO INJECTION;  Surgeon: Irving Copas., MD;  Location: [facility];  Service: Gastroenterology;;  . TEE WITHOUT CARDIOVERSION N/A 10/29/2018   Procedure: TRANSESOPHAGEAL ECHOCARDIOGRAM (TEE);  Surgeon: Teodoro Spray, MD;  Location: [facility];  Service: Cardiovascular;  Laterality: N/A;  . TEE WITHOUT  CARDIOVERSION N/A 05/30/2019   Procedure: TRANSESOPHAGEAL ECHOCARDIOGRAM (TEE);  Surgeon: Teodoro Spray, MD;  Location: [facility];  Service: Cardiovascular;  Laterality: N/A;     OB History   No obstetric history on file.     Family History  Problem Relation Age of Onset  . Hypertension Mother   . Diabetes Mellitus II Mother   . Breast cancer Mother   . Heart disease Father   . Cancer Sister     Social History   Tobacco Use  . Smoking status: Current Some Day Smoker    Packs/day: 0.25    Types: Cigarettes  . Smokeless tobacco: Never Used  Vaping Use  . Vaping Use: Never used  Substance Use Topics  . Alcohol use: No  . Drug use: No    Home Medications Prior to Admission medications   Medication Sig Start Date End Date Taking? Authorizing Provider  Amino Acids-Protein Hydrolys (FEEDING SUPPLEMENT, PRO-STAT SUGAR FREE 64,) LIQD Take 30 mLs by mouth 3 (three) times daily. 07/26/19   Cherylann Ratel A, DO  atorvastatin (LIPITOR) 40 MG tablet Take 40 mg by mouth daily.    [provider]  fluticasone (FLONASE) 50 MCG/ACT nasal spray Place 2 sprays into both nostrils daily.    [provider]  folic acid (FOLVITE) 1 MG tablet Take 1 tablet (1 mg total) by mouth daily. 08/21/18   Stark Jock Jude, MD  furosemide (LASIX) 40 MG tablet Take 1 tablet (  40 mg total) by mouth daily. 07/27/19 08/26/19  Cherylann Ratel A, DO  lactulose (CHRONULAC) 10 GM/15ML solution Take 30 mLs (20 g total) by mouth 2 (two) times daily. 07/26/19   Cherylann Ratel A, DO  Multiple Vitamin (MULTIVITAMIN WITH MINERALS) TABS tablet Take 1 tablet by mouth daily. 06/28/18   Lang Snow, NP  Multiple Vitamins-Minerals (CERTAVITE SENIOR PO) Take 1 tablet by mouth daily.    [provider]  Nutritional Supplements (RESOURCE 2.0 PO) Take 1 Bottle by mouth 2 (two) times daily.    [provider]    Allergies    Latex and Gabapentin  Review of Systems   Review of Systems  Unable to  perform ROS: Mental status change    Physical Exam Updated Vital Signs BP 118/81   Pulse (!) 108   Temp 98.3 F (36.8 C) (Rectal)   Resp (!) 22   SpO2 98%   Physical Exam Vitals and nursing note reviewed.   64 year old female, resting comfortably and in no acute distress. Vital signs are significant for rapid heart rate and slightly elevated respiratory rate. Oxygen saturation is 98%, which is normal. Head is normocephalic and atraumatic.  Right pupil is 7 mm and slightly eccentric, left pupil is pinpoint. Oropharynx is clear. Neck is nontender and supple without adenopathy or JVD. Back is nontender and there is no CVA tenderness. Lungs are clear without rales, wheezes, or rhonchi. Chest is nontender. Heart has regular rate and rhythm without murmur. Abdomen is soft, flat, nontender without masses or hepatosplenomegaly and peristalsis is normoactive. Extremities: Right above-the-knee amputation, amputation of left first toe. Skin is warm and dry without rash. Neurologic: Awake and responds to painful stimuli but not to verbal stimuli.  She does move both arms and her left leg, but there is significantly more movement on the left than on the right.  There is spasticity on the left.  ED Results / Procedures / Treatments   Labs (all labs ordered are listed, but only abnormal results are displayed) Labs Reviewed  CBC WITH DIFFERENTIAL/PLATELET - Abnormal; Notable for the following components:      Result Value   Hemoglobin 10.8 (*)    MCV 102.0 (*)    MCHC 26.2 (*)    RDW 17.6 (*)    Platelets 134 (*)    Lymphs Abs 0.6 (*)    All other components within normal limits  COMPREHENSIVE METABOLIC PANEL - Abnormal; Notable for the following components:   Glucose, Bld 211 (*)    BUN 53 (*)    Creatinine, Ser 2.41 (*)    Total Protein 8.6 (*)    Albumin 2.7 (*)    GFR calc non Af Amer 21 (*)    GFR calc Af Amer 24 (*)    All other components within normal limits  AMMONIA -  Abnormal; Notable for the following components:   Ammonia 61 (*)    All other components within normal limits  CBG MONITORING, ED - Abnormal; Notable for the following components:   Glucose-Capillary 193 (*)    All other components within normal limits  I-STAT ARTERIAL BLOOD GAS, ED - Abnormal; Notable for the following components:   pH, Arterial 7.174 (*)    pCO2 arterial 80.0 (*)    Bicarbonate 29.5 (*)    Sodium 146 (*)    All other components within normal limits  I-STAT ARTERIAL BLOOD GAS, ED - Abnormal; Notable for the following components:   pH, Arterial 7.226 (*)  pCO2 arterial 69.4 (*)    Bicarbonate 28.9 (*)    Sodium 146 (*)    HCT 34.0 (*)    Hemoglobin 11.6 (*)    All other components within normal limits  SARS CORONAVIRUS 2 BY RT PCR (HOSPITAL ORDER, [facility])  LACTIC ACID, PLASMA  URINALYSIS, ROUTINE W REFLEX MICROSCOPIC  LACTIC ACID, PLASMA  BLOOD GAS, ARTERIAL    EKG EKG Interpretation  Date/Time:  September 10, 2019 17:59:40 EDT Ventricular Rate:  112 PR Interval:    QRS Duration: 144 QT Interval:  364 QTC Calculation: 497 R Axis:   -43 Text Interpretation: Sinus tachycardia RBBB and LAFB When compared with ECG of 06/30/2019, No significant change was found Reconfirmed by Delora Fuel ([id number]) on 10-Sep-2019 7:47:38 PM   Radiology CT Head Wo Contrast  Result Date: 2019-09-10 CLINICAL DATA:  64 year old female with altered mental status. EXAM: CT HEAD WITHOUT CONTRAST TECHNIQUE: Contiguous axial images were obtained from the base of the skull through the vertex without intravenous contrast. COMPARISON:  Head CT dated 07/04/2019. FINDINGS: Brain: The ventricles and sulci appropriate size for patient's age. Moderate periventricular and deep white matter chronic microvascular ischemic changes. Old right basal ganglia infarct. There is no acute intracranial hemorrhage. No mass effect or midline shift. No extra-axial fluid  collection. Vascular: No hyperdense vessel or unexpected calcification. Skull: Normal. Negative for fracture or focal lesion. Sinuses/Orbits: The visualized paranasal sinuses and mastoid air cells are clear. Mild bilateral exophthalmos. Other: Old nasal bone fractures. IMPRESSION: 1. No acute intracranial hemorrhage. 2. Moderate chronic microvascular ischemic changes. Old right basal ganglia infarct. Electronically Signed   By: Anner Crete M.D.   On: 09-10-2019 18:50   DG Chest Port 1 View  Result Date: 2019/09/10 CLINICAL DATA:  Altered mental status EXAM: PORTABLE CHEST 1 VIEW COMPARISON:  Chest x-ray dated July 18, 2019 FINDINGS: Again noted is cardiomegaly. There are moderate to large bilateral pleural effusions which have increased in size from the prior study. The patient is status post prior valve replacement and median sternotomy. There is vascular congestion with findings consistent with pulmonary edema. Bibasilar airspace disease is again noted. IMPRESSION: Findings consistent with worsening congestive heart failure as detailed above. Electronically Signed   By: Constance Holster M.D.   On: 09/10/19 18:00    Procedures Procedure Name: Intubation Date/Time: September 10, 2019 7:40 PM Performed by: Delora Fuel, MD Pre-anesthesia Checklist: Patient identified, Patient being monitored, Emergency Drugs available, Timeout performed and Suction available Oxygen Delivery Method: Non-rebreather mask Preoxygenation: Pre-oxygenation with 100% oxygen Induction Type: Rapid sequence Ventilation: Mask ventilation without difficulty Laryngoscope Size: Glidescope and 3 Grade View: Grade I Tube size: 7.5 mm Number of attempts: 1 Airway Equipment and Method: Rigid stylet and Video-laryngoscopy Placement Confirmation: ETT inserted through vocal cords under direct vision,  CO2 detector and Breath sounds checked- equal and bilateral Secured at: 24 cm Tube secured with: ETT holder Dental Injury: Teeth and  Oropharynx as per pre-operative assessment        CRITICAL CARE Performed by: Delora Fuel Total critical care time: 90 minutes Critical care time was exclusive of separately billable procedures and treating other patients. Critical care was necessary to treat or prevent imminent or life-threatening deterioration. Critical care was time spent personally by me on the following activities: development of treatment plan with patient and/or surrogate as well as nursing, discussions with consultants, evaluation of patient's response to treatment, examination of patient, obtaining history from patient or surrogate, ordering and performing  treatments and interventions, ordering and review of laboratory studies, ordering and review of radiographic studies, pulse oximetry and re-evaluation of patient's condition.  Medications Ordered in ED Medications  etomidate (AMIDATE) injection (20 mg Intravenous Given 2019-09-03 1934)  succinylcholine (ANECTINE) injection (100 mg Intravenous Given 03-Sep-2019 1936)  propofol (DIPRIVAN) 1000 MG/100ML infusion (has no administration in time range)  sodium chloride 0.9 % bolus 1,000 mL (has no administration in time range)  fentaNYL (SUBLIMAZE) injection 50 mcg (has no administration in time range)  fentaNYL 2573mg in NS 2564m(1052mml) infusion-PREMIX (has no administration in time range)  fentaNYL (SUBLIMAZE) bolus via infusion 50 mcg (has no administration in time range)    ED Course  I have reviewed the triage vital signs and the nursing notes.  Pertinent labs & imaging results that were available during my care of the patient were reviewed by me and considered in my medical decision making (see chart for details).  MDM Rules/Calculators/A&P Altered mental status, etiology unclear.  Possible stroke.  Last known normal greater than 16 hours ago, places her out of the window for any acute intervention.  Possible sepsis.  Will check x-ray, screening labs, urinalysis.   She does have history of respiratory failure, will check ABG to make sure she is not hypercarbic, although she is more alert than I would expect if she truly has hypercarbia.  Old records are reviewed, and she had been admitted in June through July with respiratory failure..,  She was DNR on leaving the hospital, but there is no mention of CODE STATUS and her transfer record from the skilled nursing facility.  5:58 PM ABG has come back showing acute respiratory acidosis with pH 7.17, PCO2 80.6 high.  We will try to put patient on BiPAP.  Will need to contact her husband regarding whether he would want her on a ventilator again.  6:29 PM I have been unable to reach her husband.  However, on careful review of records during recent hospitalization, has been had stated on several times that he did not want patient put back on a ventilator if her condition deteriorated.  I will follow that recommendation.  6:41 PM Her husband has called back and states that he actually does want her intubated and actually wants her as full code.  1920 Repeat ECG shows only modest improvement in PCO2 and pH.pH still dangerously low at 7.22.  Will need to intubate.    Case is discussed with Dr. AveGenevive Bi critical care medicine who agree to admit the patient.  Labs are significant for stable thrombocytopenia, new acute kidney injury.  Hemoglobin has increased compared with most recent value with new macrocytosis.  Thrombocytopenia is present and unchanged from baseline.  Ammonia level is mildly elevated, and probably not a major contributor to her mental status.  After intubation, blood pressure has dropped into the 80s.  She was given IV fluids.  Final Clinical Impression(s) / ED Diagnoses Final diagnoses:  Acute and chronic respiratory failure with hypercapnia (HCC)  Acute kidney injury (nontraumatic) (HCC)  Increased ammonia level  Macrocytic anemia  Thrombocytopenia ([place]  Rx / DC Orders ED Discharge  Orders    None       GliDelora FuelD 08/[id number]/[id number] GliDelora FuelD 09/14/18/6050

## 2019-09-18 NOTE — Code Documentation (Signed)
ETT placed, +Bil breath sounds, + color change, 24@ the lip

## 2019-09-18 NOTE — ED Notes (Signed)
Admitting paged per RN woodys request

## 2019-09-18 NOTE — ED Provider Notes (Signed)
CODE BLUE note:  Patient again developed bradycardia and then went asystole and CODE BLUE was called.  She was given 1 mg of epinephrine with return of spontaneous circulation.  She is being started on an epinephrine drip.  Cardiopulmonary Resuscitation (CPR) Procedure Note Directed/Performed by: Delora Fuel I personally directed ancillary staff and/or performed CPR in an effort to regain return of spontaneous circulation and to maintain cardiac, neuro and systemic perfusion.     Delora Fuel, MD 11/13/23 2234

## 2019-09-18 NOTE — Code Documentation (Signed)
2205 PEA, Compressions started  2207  Epi administered 79m NS flush Compressions resumed  22202 GRoxanne MinsMD arrived to room  Pulse check PEA  CPR resumed   P 115 per monitor with compressions   2209 RT arrived  Pharmacy arrived  Pulse check PEA, compressions resumed   2210  Epi administered  18mNS flush  2211  Bicarb administered  1021mS flush  P 102 per monitored compressions  202/108 (131)  Pulse check PEA  2212 Epi administered 46m23mush  2213 Pulse Check, compressions stopped ROSC achieved  181/149 (159) P 205- per monitor   2217 151/102 (117) P 135  Cardiovert 150J

## 2019-09-18 NOTE — ED Triage Notes (Signed)
Pt bib ems from [facility] with AMS. LKW sometime yesterday per EMS. Pt normally alert and can answer questions. Pt lethargic today with no verbal responses. Pt clammy on arrival. EMS reports responsive to verbal and painful stimuli but will not talk. Pt with L pupil constriction and R pupil dilated. Pt does not follow any commands.  104/60 HR 100 afib 98% 3L (baseline) CBG 180

## 2019-09-18 NOTE — Plan of Care (Deleted)
I have taken an interval history, reviewed the chart and examined the patient. I agree with the Advanced Practitioner's note, impression, and recommendations as outlined.   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 Presented on 8/3 with AMS. Found with acute hypercarbic respiratory failure. Trialed BiPAP and eventually intubated in the ED. PCCM consulted for admission.  On exam, obese female laying in stretcher obtunded and on mechanical ventilation. R pupil 3-52m and L pupil pinpoint, sluggishly responsive, no tracking. Withdraws and localizes to pain. Does not follow commands. Lungs bibasilar crackles. Hiatal hernia. S/p R AKA. Left lower extremity with 2+ pitting edema.    Labs and imaging reviewed and interpreted personally by me. Hg 10.8 BUN/Cr 53/2.41 ABG 7.174/80/93>>7.226/69/86 post-intubation CT head 8Sep 02, 2021- no acute intracranial abnormalities CXR 809-02-2019- ETT in place, cardiomegaly, basilar interstitial opacities/pleural effusions  Assessment  Acute metabolic encephalopathy Acute hypercarbic respiratory failure requiring mechanical ventilation Hypotension secondary to sedation vs low-output state Acute on chronic combined heart failure  63 chronically ill female with multiple co-morbidities admitted for acute metabolic encephalopathy secondary to acute hypercarbic respiratory failure requiring mechanical ventilation. Had episode of peri-intubation hypotension which has resolved. Exam suggests hypervolemia and suspect acute combined heart failure is likely cause of her respiratory failure however unable to diurese with low-normal blood pressure.  Discontinue IVF now and plan for diuresis when pressures improve. Continue full vent support with goal RASS -1. Ok to start peripheral vasopressors to start sedation. Monitor UOP and Cr.  The patient is critically ill with multiple organ systems failure and requires high complexity decision making for assessment and support, frequent evaluation and titration of therapies, application of advanced monitoring technologies and extensive interpretation of multiple databases.   Critical Care Time devoted to patient care services described in this note is 60 Minutes. This time reflects time of care of this signee Dr. JRodman Pickle This critical care time does not reflect procedure time, or teaching time or supervisory time of PA/NP/Med student/Med Resident etc but could involve care discussion time.  JRodman Pickle M.D. L[facility] LLCPulmonary/Critical Care Medicine 8September 02, 20218:46 PM   Please see Amion for pager number to reach on-call Pulmonary and Critical Care Team.

## 2019-09-18 DEATH — deceased

## 2019-11-19 IMAGING — DX DG CHEST 1V PORT
1 series · 2 of 2 positions shown · non-contrast
Comparison: 08/29/2018

CLINICAL DATA: Fall

EXAM:
PORTABLE CHEST 1 VIEW

[Series 1: chest ap · 0.14mm/px · 2 of 2 slices shown]
[im 1/2]
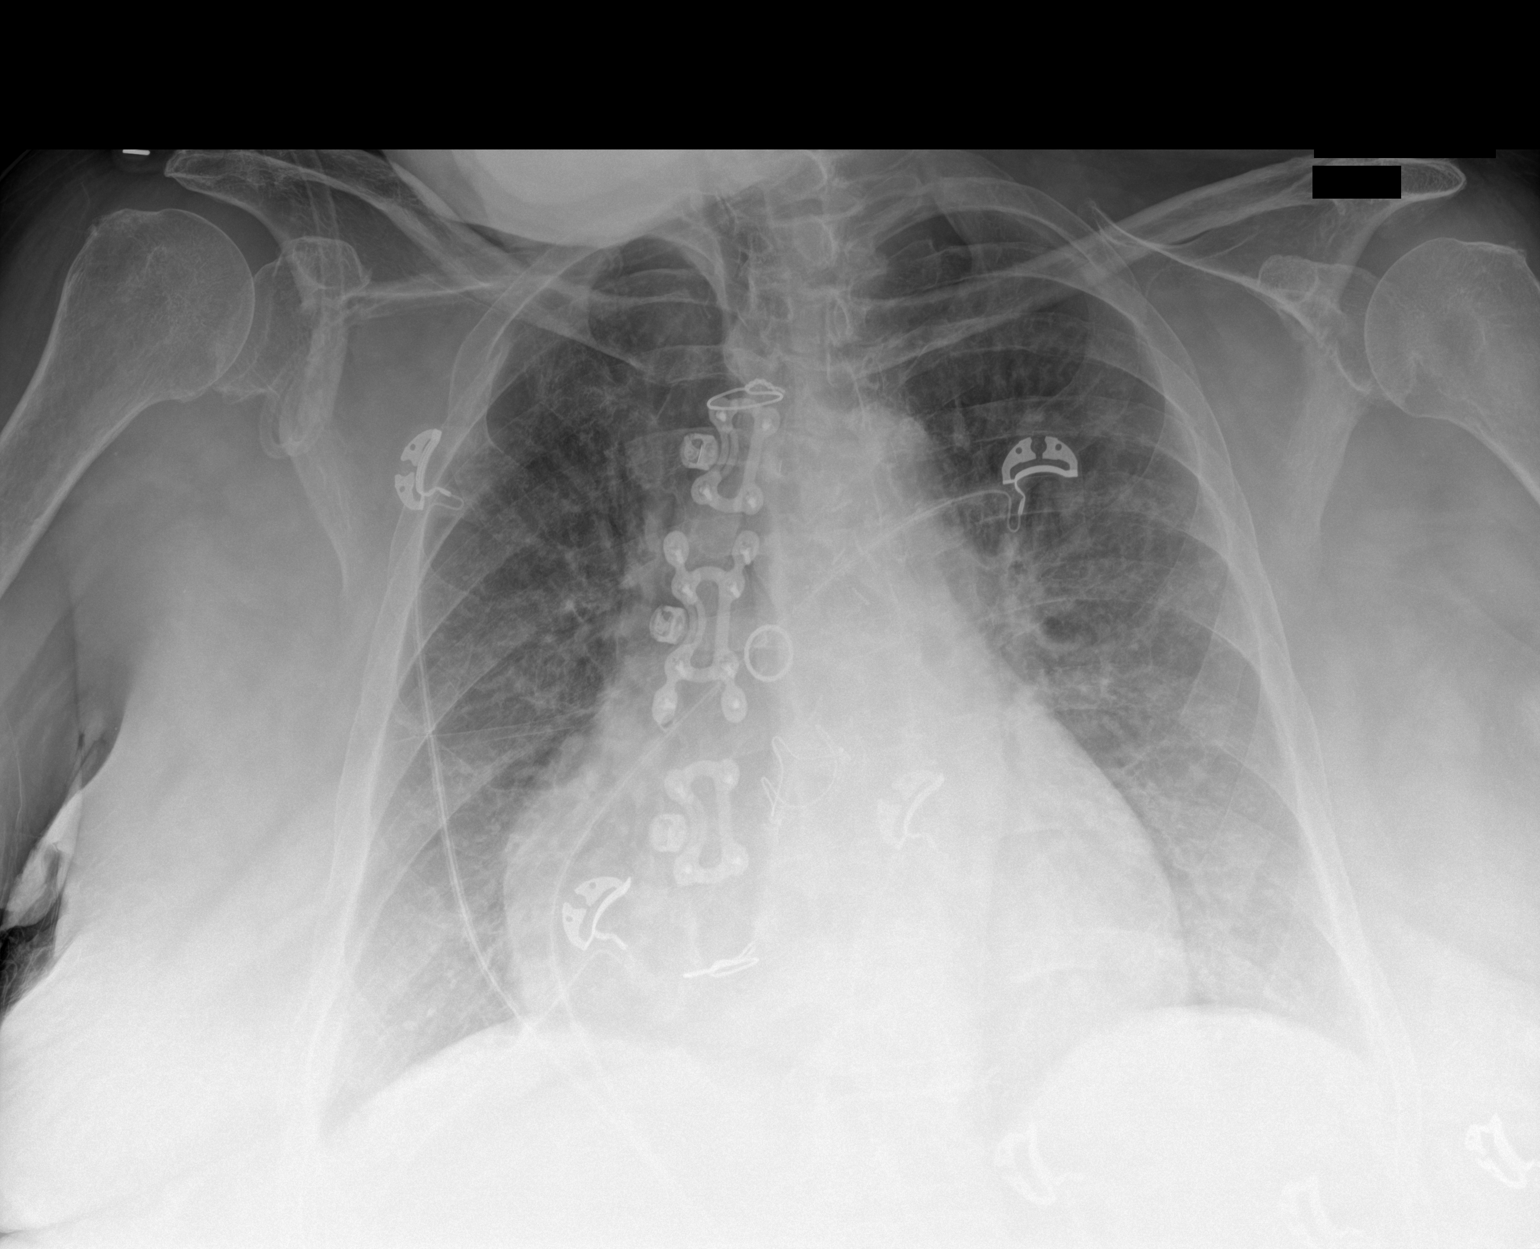
[im 2/2]
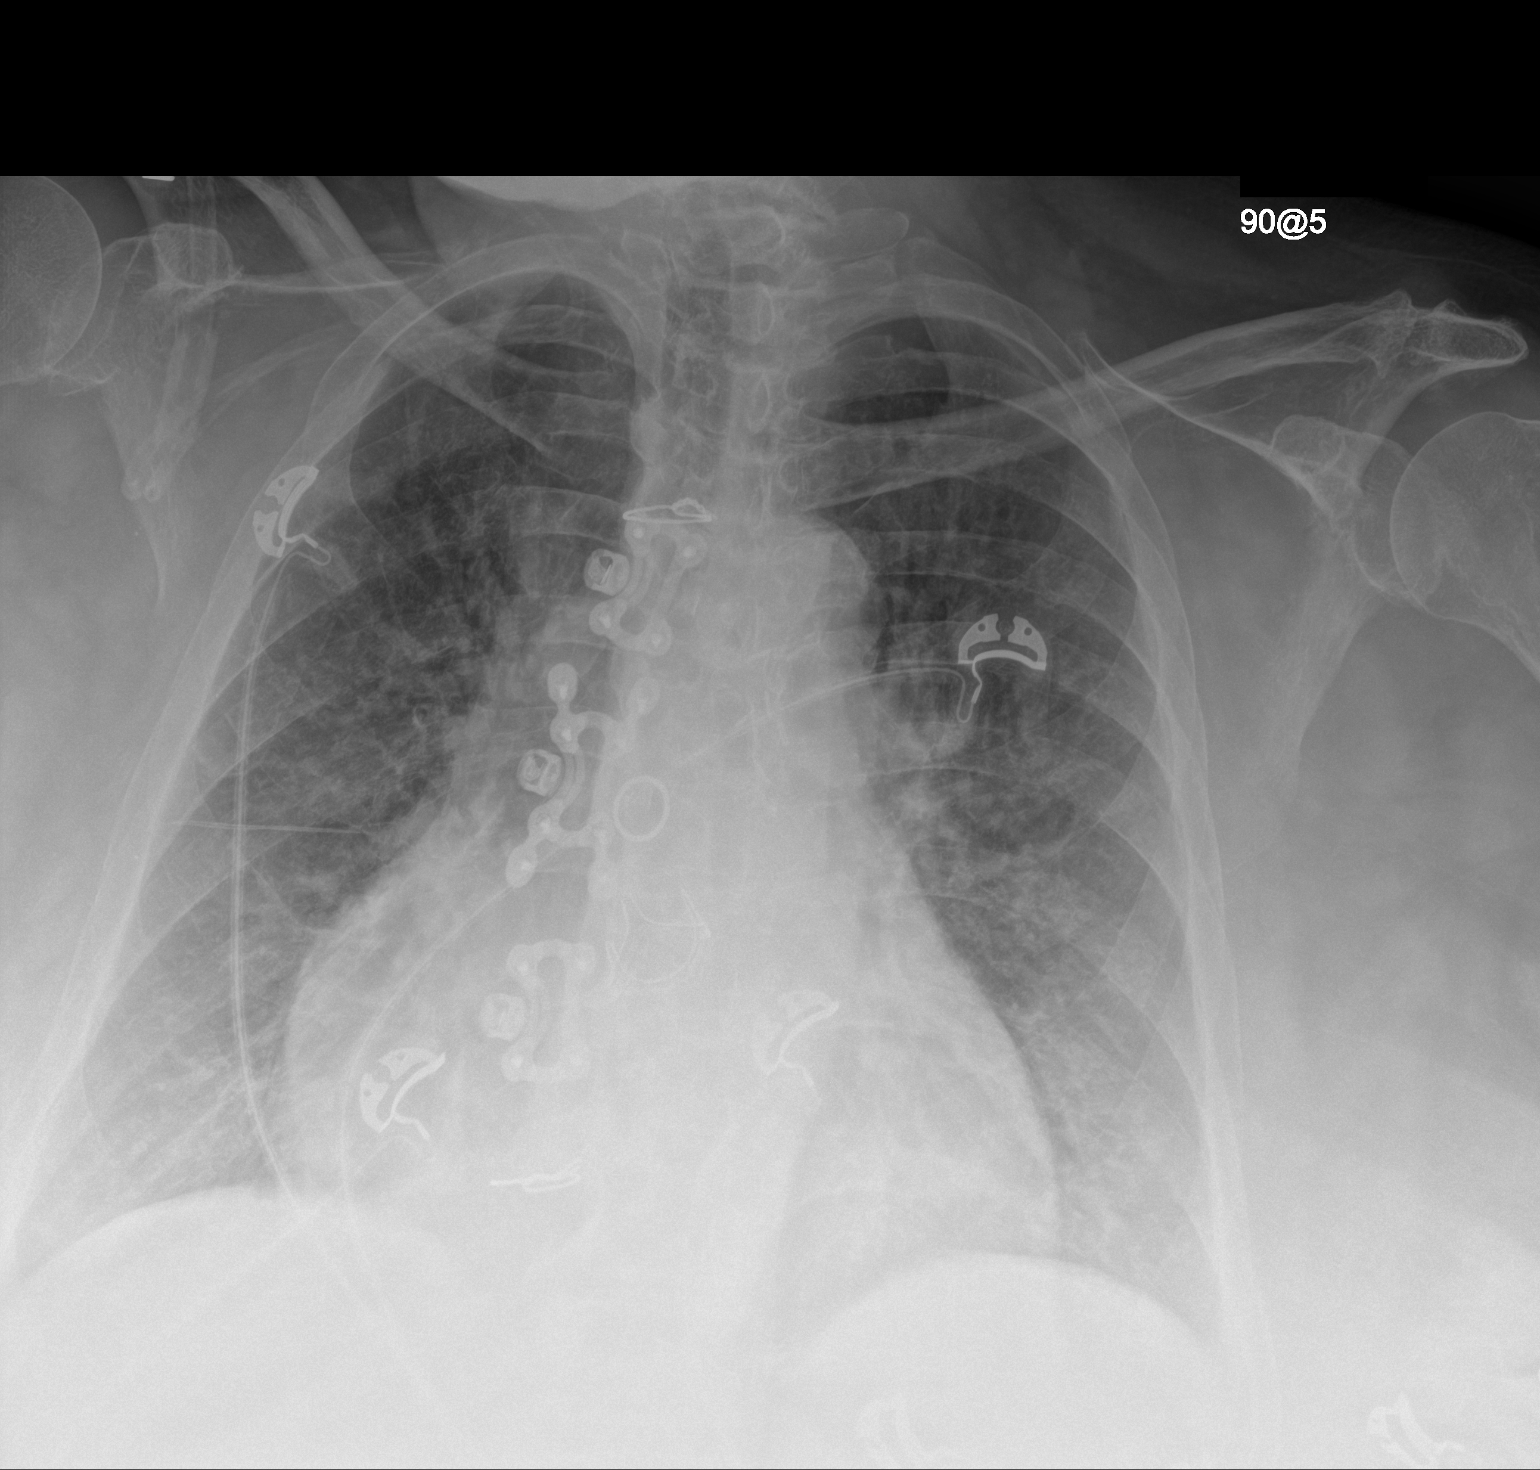

[2 of 2 positions shown; findings below may reference images not displayed]

FINDINGS: Mild cardiomegaly with prosthetic valve and left atrial closure
device. No pulmonary edema or focal airspace consolidation. No
pleural effusion or pneumothorax.
IMPRESSION: No active cardiopulmonary disease.

## 2019-11-20 IMAGING — CT CT ABD-PELV W/O CM
2 of 4 series · 16 of 46 positions shown, 18 images · non-contrast
Comparison: June 21, 2018

CLINICAL DATA: Abdominal pain, gastroenteritis or colitis

EXAM:
CT ABDOMEN AND PELVIS WITHOUT CONTRAST
TECHNIQUE: Multidetector CT imaging of the abdomen and pelvis was performed
following the standard protocol without IV contrast.

[Series 2: routine abd/pel wo · axial · 0.87mm/px · z∈[-1084,-674]mm · 13 of 90 slices shown, 15 images]
[im 4/90  soft-tissue]
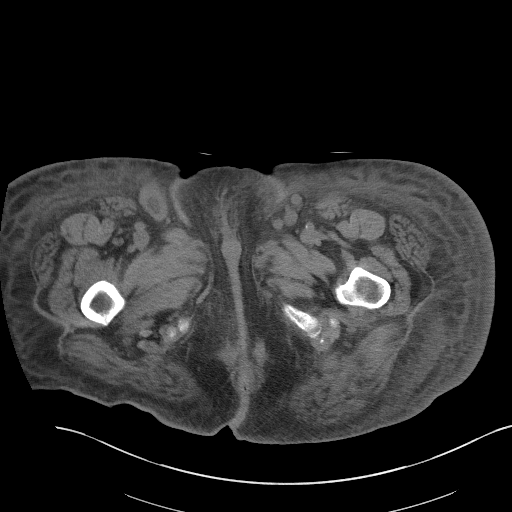
[im 4/90  bone]
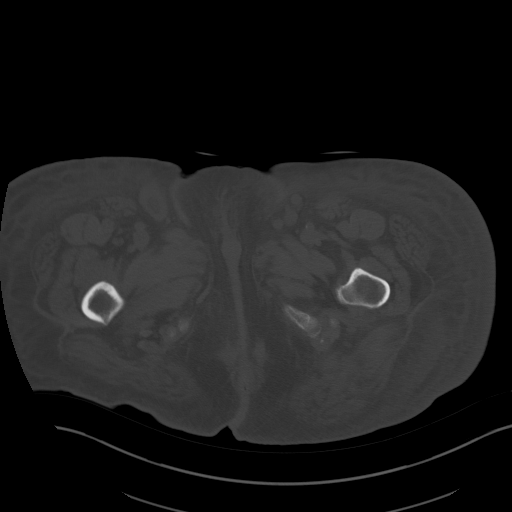
[im 12/90  soft-tissue]
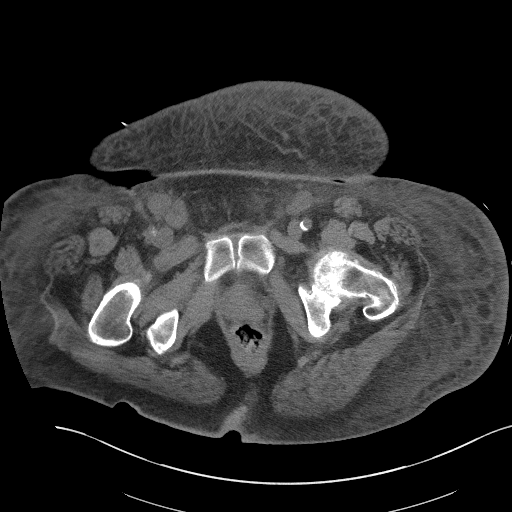
[im 19/90  soft-tissue]
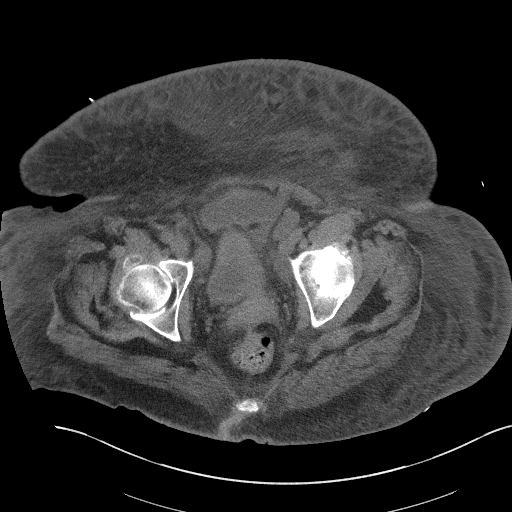
[im 26/90  soft-tissue]
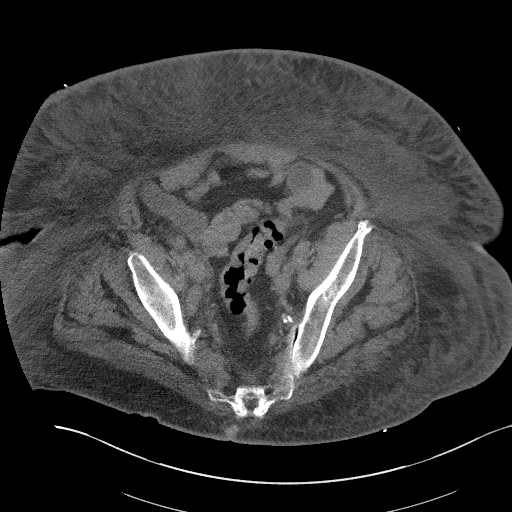
[im 30/90  soft-tissue]
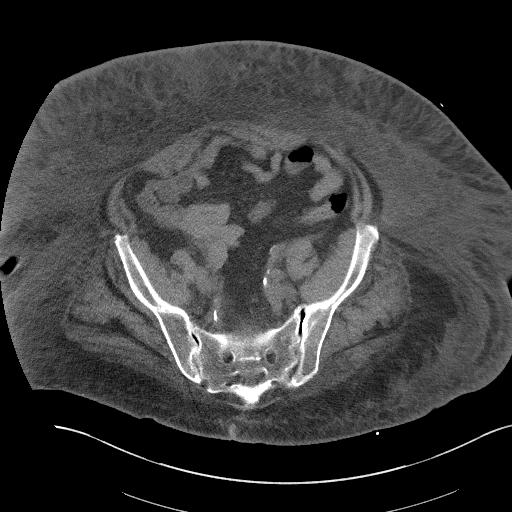
[im 38/90  soft-tissue]
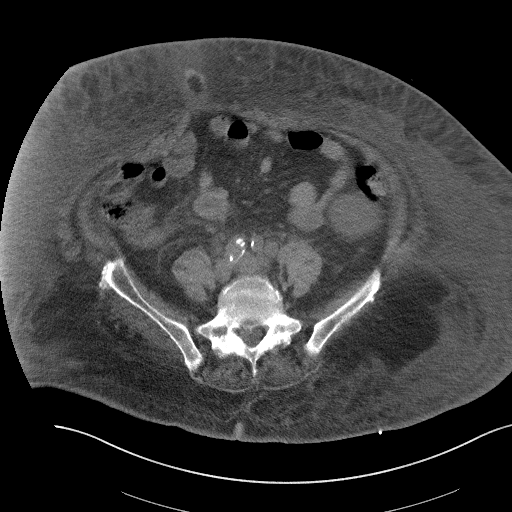
[im 45/90  soft-tissue]
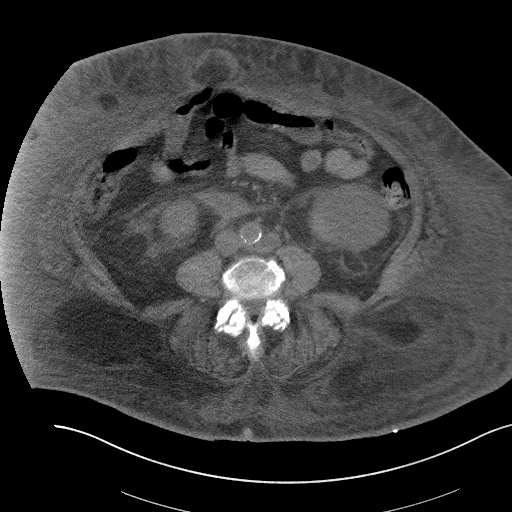
[im 52/90  soft-tissue]
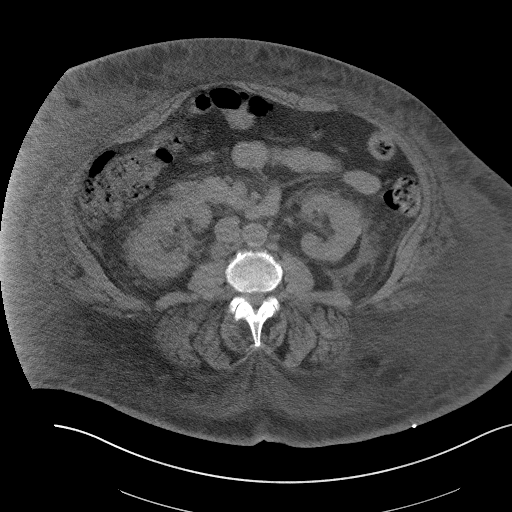
[im 60/90  soft-tissue]
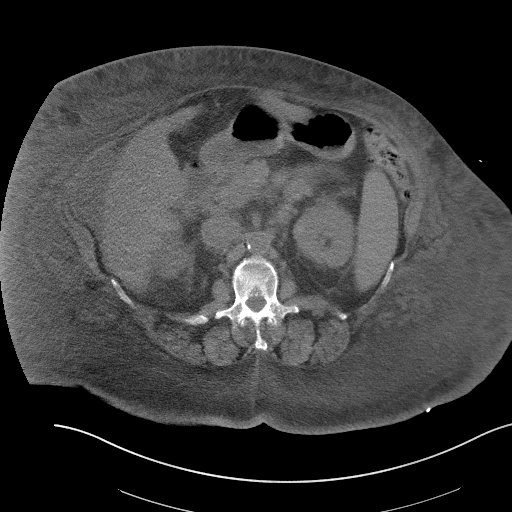
[im 60/90  bone]
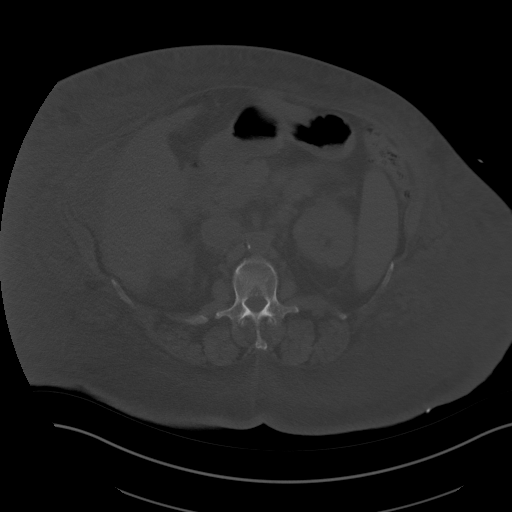
[im 64/90  soft-tissue]
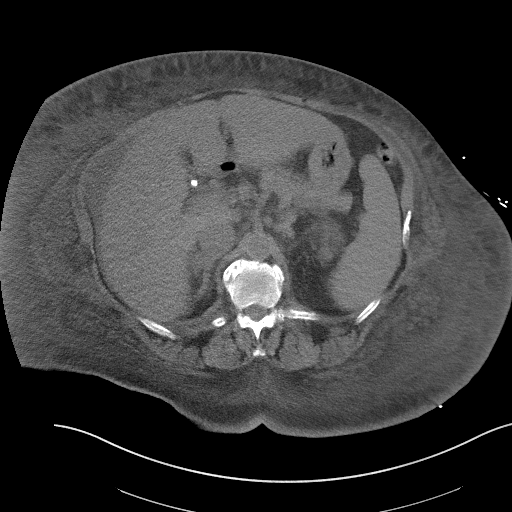
[im 71/90  soft-tissue]
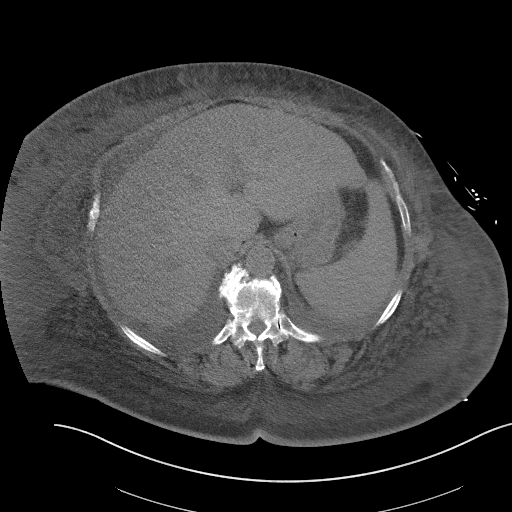
[im 78/90  soft-tissue]
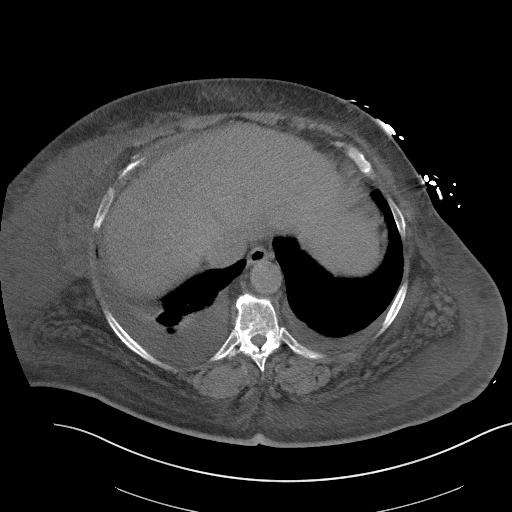
[im 86/90  soft-tissue]
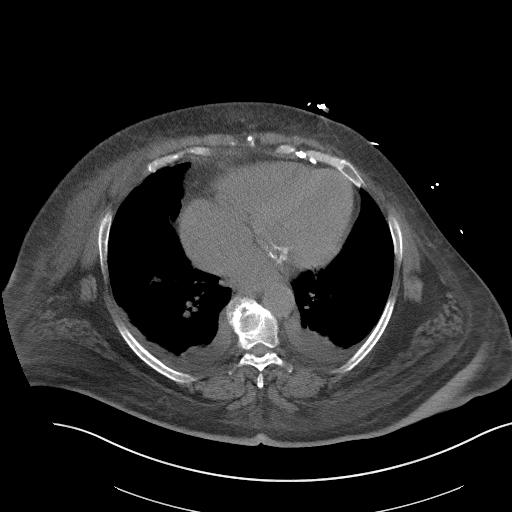

[Series 5: coronal st · coronal · 0.92mm/px · 3 of 122 slices shown]
[im 41/122  soft-tissue]
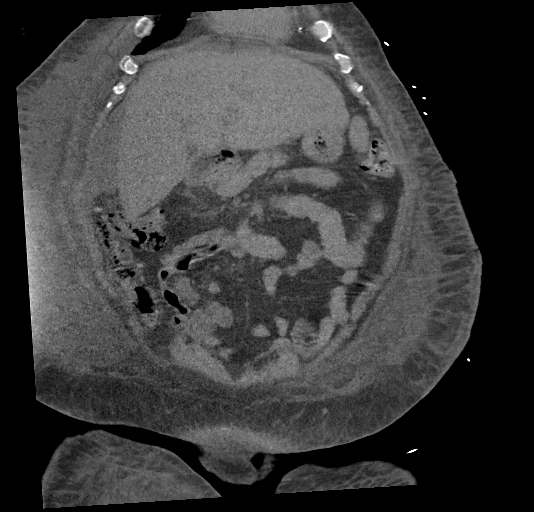
[im 54/122  soft-tissue]
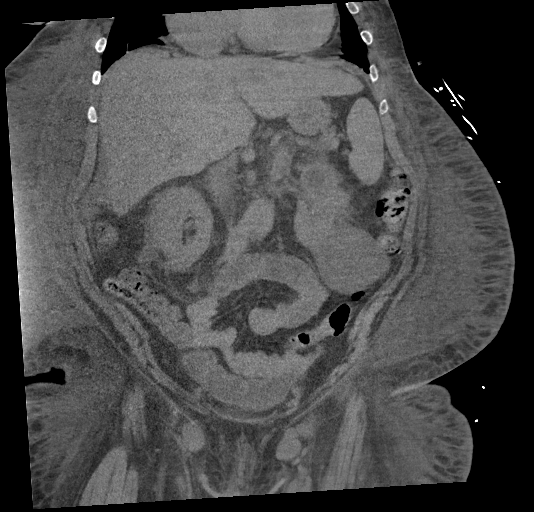
[im 68/122  soft-tissue]
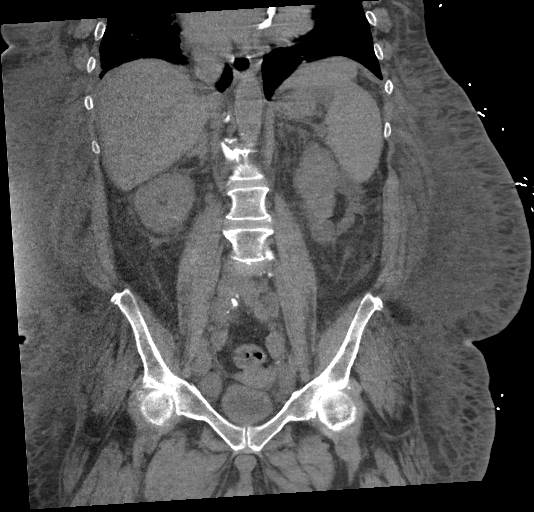

[16 of 46 positions shown; findings below may reference images not displayed]

FINDINGS: Lower chest: There is moderate cardiomegaly. Small bilateral pleural
effusions are seen, right greater than left with peripheral
atelectasis.

Hepatobiliary: Although limited due to the lack of intravenous
contrast, normal in appearance without gross focal abnormality. The
patient is status post cholecystectomy. No biliary ductal dilation.

Pancreas:  Unremarkable.  No surrounding inflammatory changes.

Spleen: Normal in size. Although limited due to the lack of
intravenous contrast, normal in appearance.

Adrenals/Urinary Tract: Both adrenal glands appear normal. Again
noted is a 6 cm low-density lesion lower pole of the left kidney
brought to pull left-sided low-density lesions are seen. Bilateral
perinephric stranding is seen. This appears to be more prominent
than the prior exam, and is asymmetric, right greater than left.

Stomach/Bowel: The stomach and small bowel are normal in size and
contour. There is a moderate amount of colonic stool. Scattered
colonic diverticula are noted without diverticulitis.

Vascular/Lymphatic: There are no enlarged abdominal or pelvic lymph
nodes. Scattered aortic atherosclerotic calcifications are seen
without aneurysmal dilatation.

Reproductive: The uterus and adnexa are unremarkable.

Other: There is diffuse extensive anasarca seen. There is a small
fat containing anterior umbilical hernia. A small amount of
perihepatic ascites is seen.

Musculoskeletal: No acute or significant osseous findings.
IMPRESSION: 1. Diffuse extensive anasarca.
2. Small bilateral pleural effusions, slightly increased in size
from the prior exam.
3. Small amount of perihepatic ascites.
4. Bilateral perinephric stranding, right greater than left,
increased from the prior exam. This could be due to acute
pyelonephritis.
5. Colonic diverticula without diverticulitis.

## 2019-11-21 IMAGING — US US ABDOMEN LIMITED
1 series · 14 of 25 positions shown · non-contrast
Comparison: Abdominal ultrasound 12/19/2016, CT abdomen pelvis
10/26/2018

CLINICAL DATA: Cirrhosis

EXAM:
ULTRASOUND ABDOMEN LIMITED RIGHT UPPER QUADRANT

[Series 1: us abdomen limited · 14 of 30 slices shown]
[im 1/30]
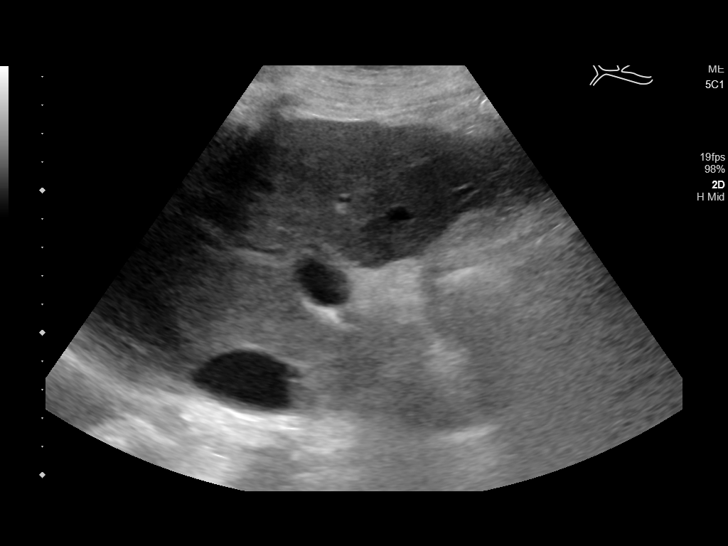
[im 3/30]
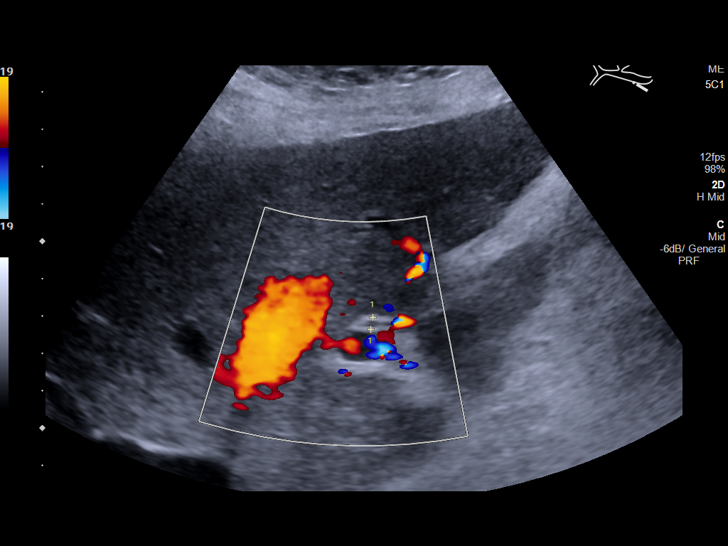
[im 5/30]
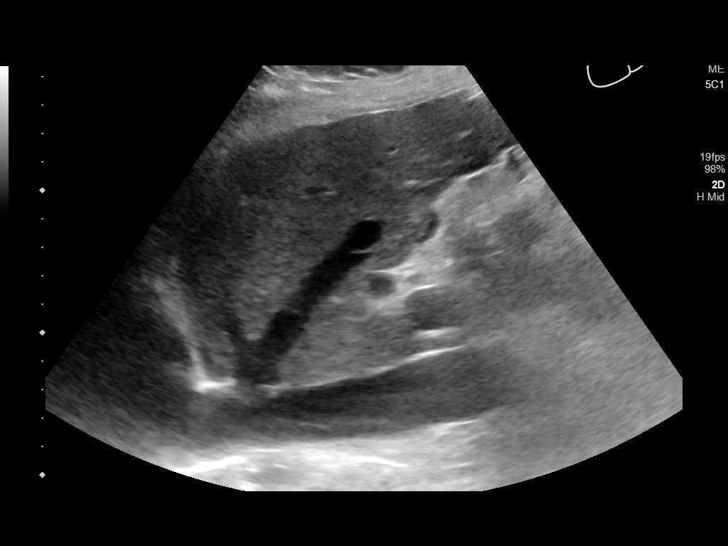
[im 8/30]
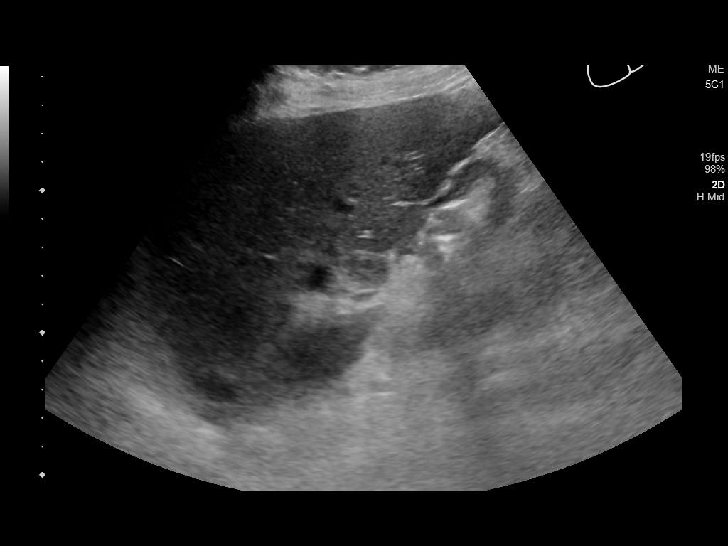
[im 10/30]
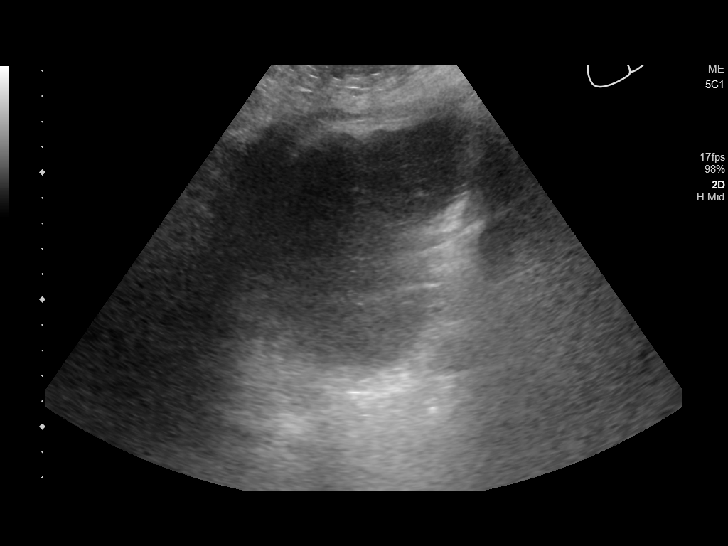
[im 11/30]
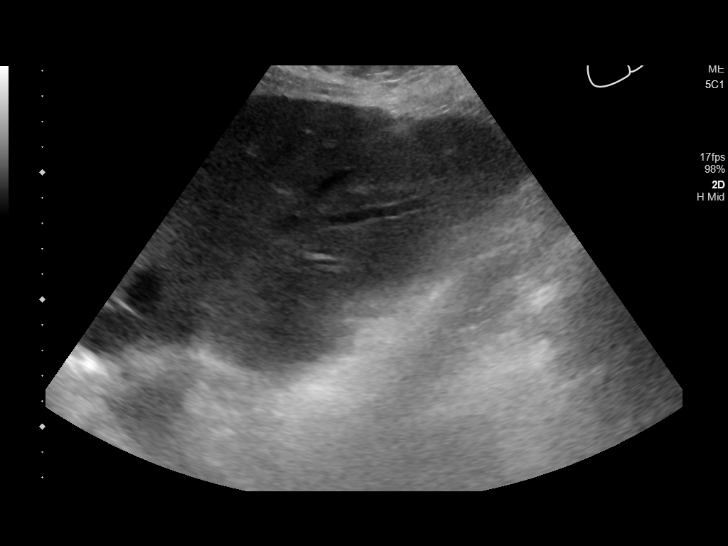
[im 14/30]
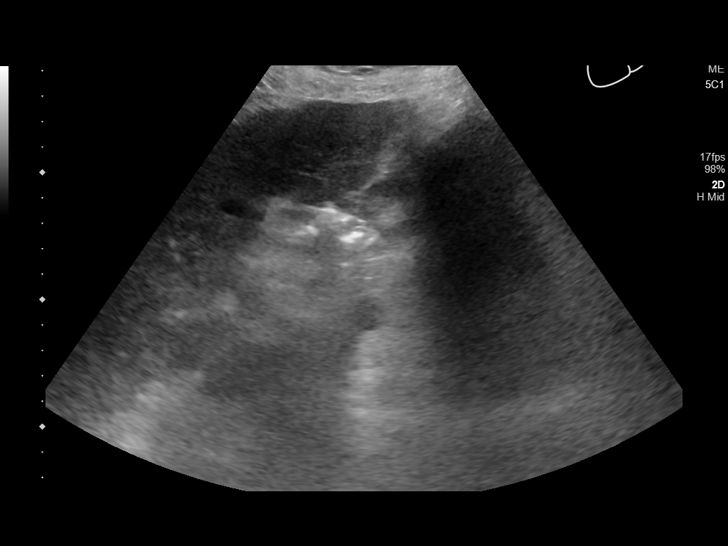
[im 16/30]
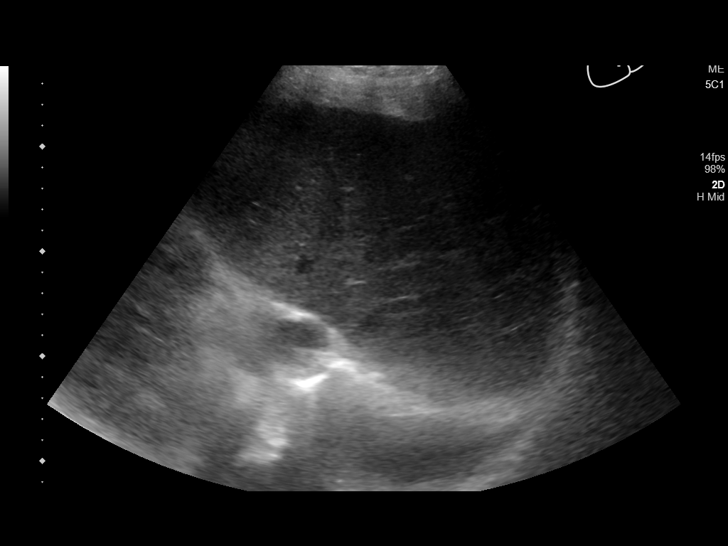
[im 19/30]
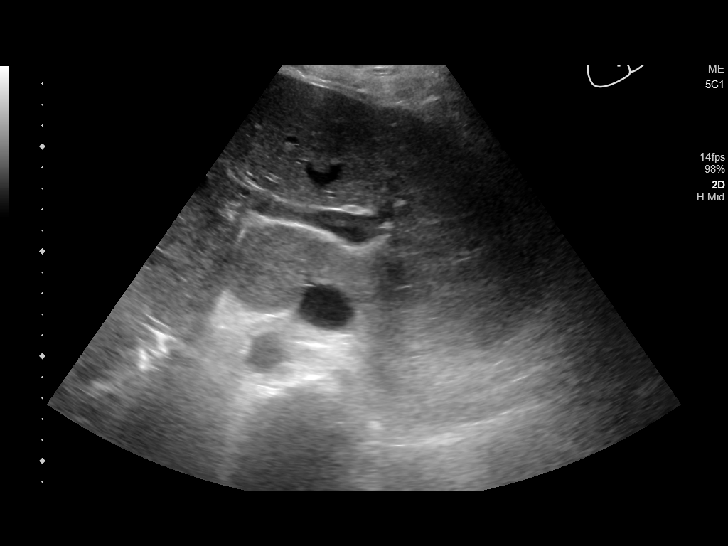
[im 20/30]
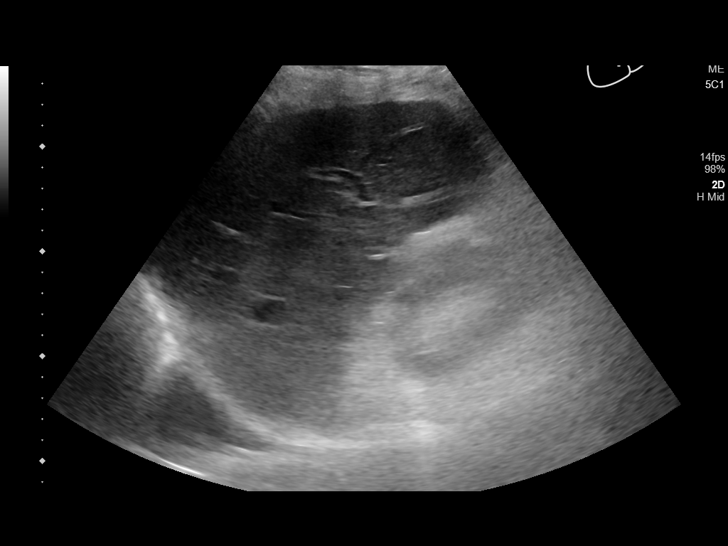
[im 22/30]
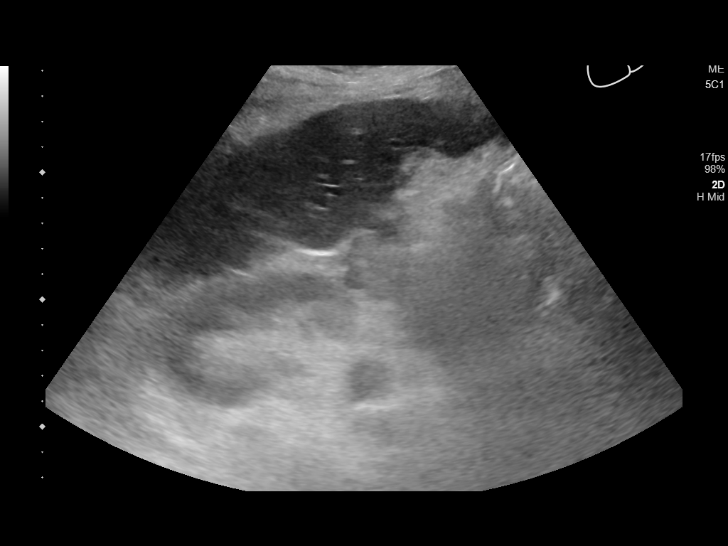
[im 25/30]
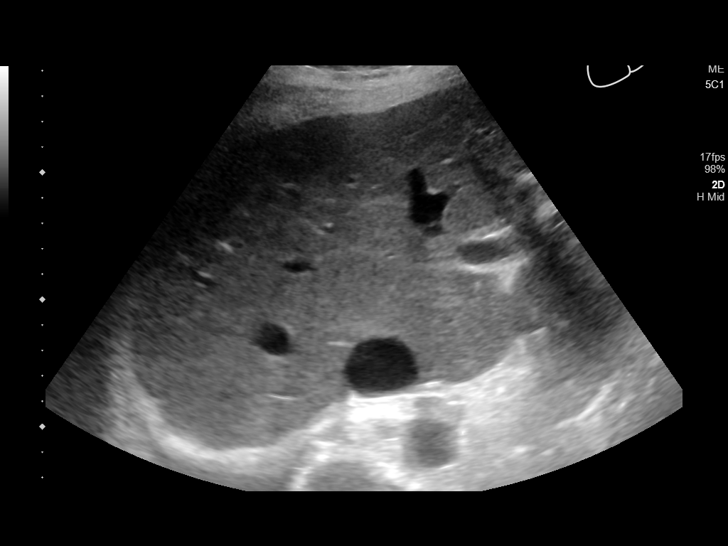
[im 27/30]
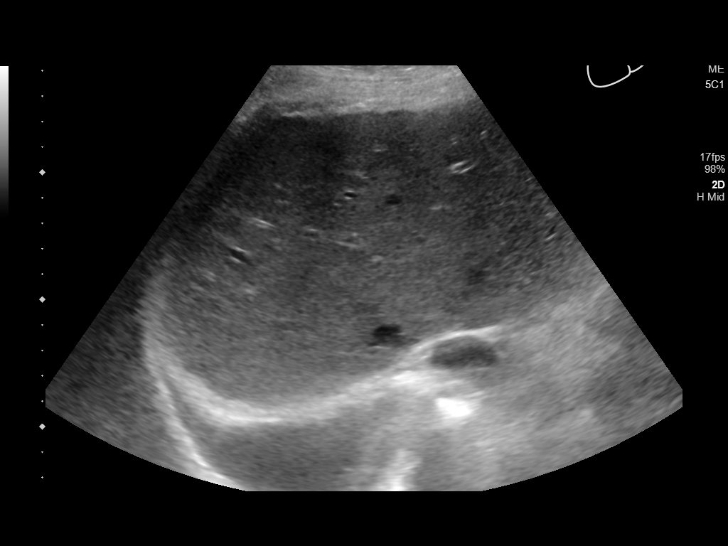
[im 30/30]
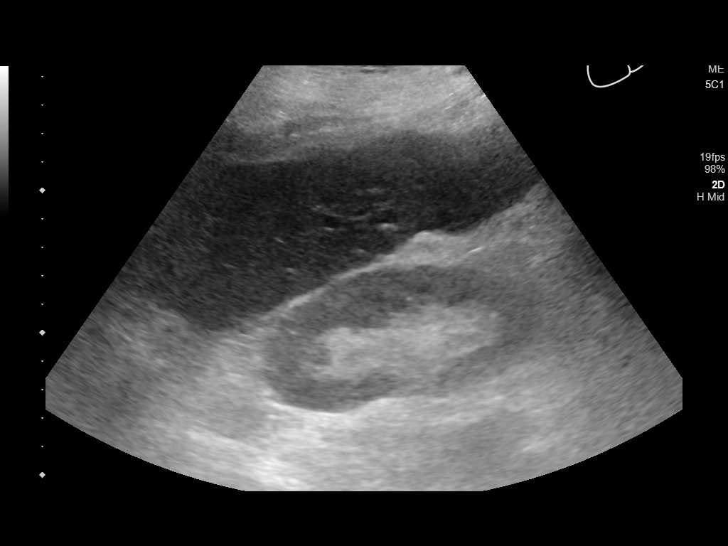

[14 of 25 positions shown; findings below may reference images not displayed]

FINDINGS: Gallbladder:

Surgically absent.

Common bile duct:

Diameter: 0.3 cm

Liver:

No focal lesion identified. The parenchymal echotexture is mildly
heterogeneous with mildly nodular contour in keeping with history of
cirrhosis. Portal vein is patent on color Doppler imaging with
normal direction of blood flow towards the liver.

Other: Probable small right pleural effusion.
IMPRESSION: 1. Appearance of the liver compatible with cirrhosis. No focal liver
lesion identified.

2.  Probable small right pleural effusion.

## 2019-11-24 IMAGING — DX DG CHEST 1V PORT
1 series · 1 of 1 positions shown · non-contrast
Comparison: CT 10/26/2018.  Chest x-ray 10/25/2018.

CLINICAL DATA: Pulmonary disease.  Possible ileus.

EXAM:
PORTABLE CHEST 1 VIEW

[chest ap]
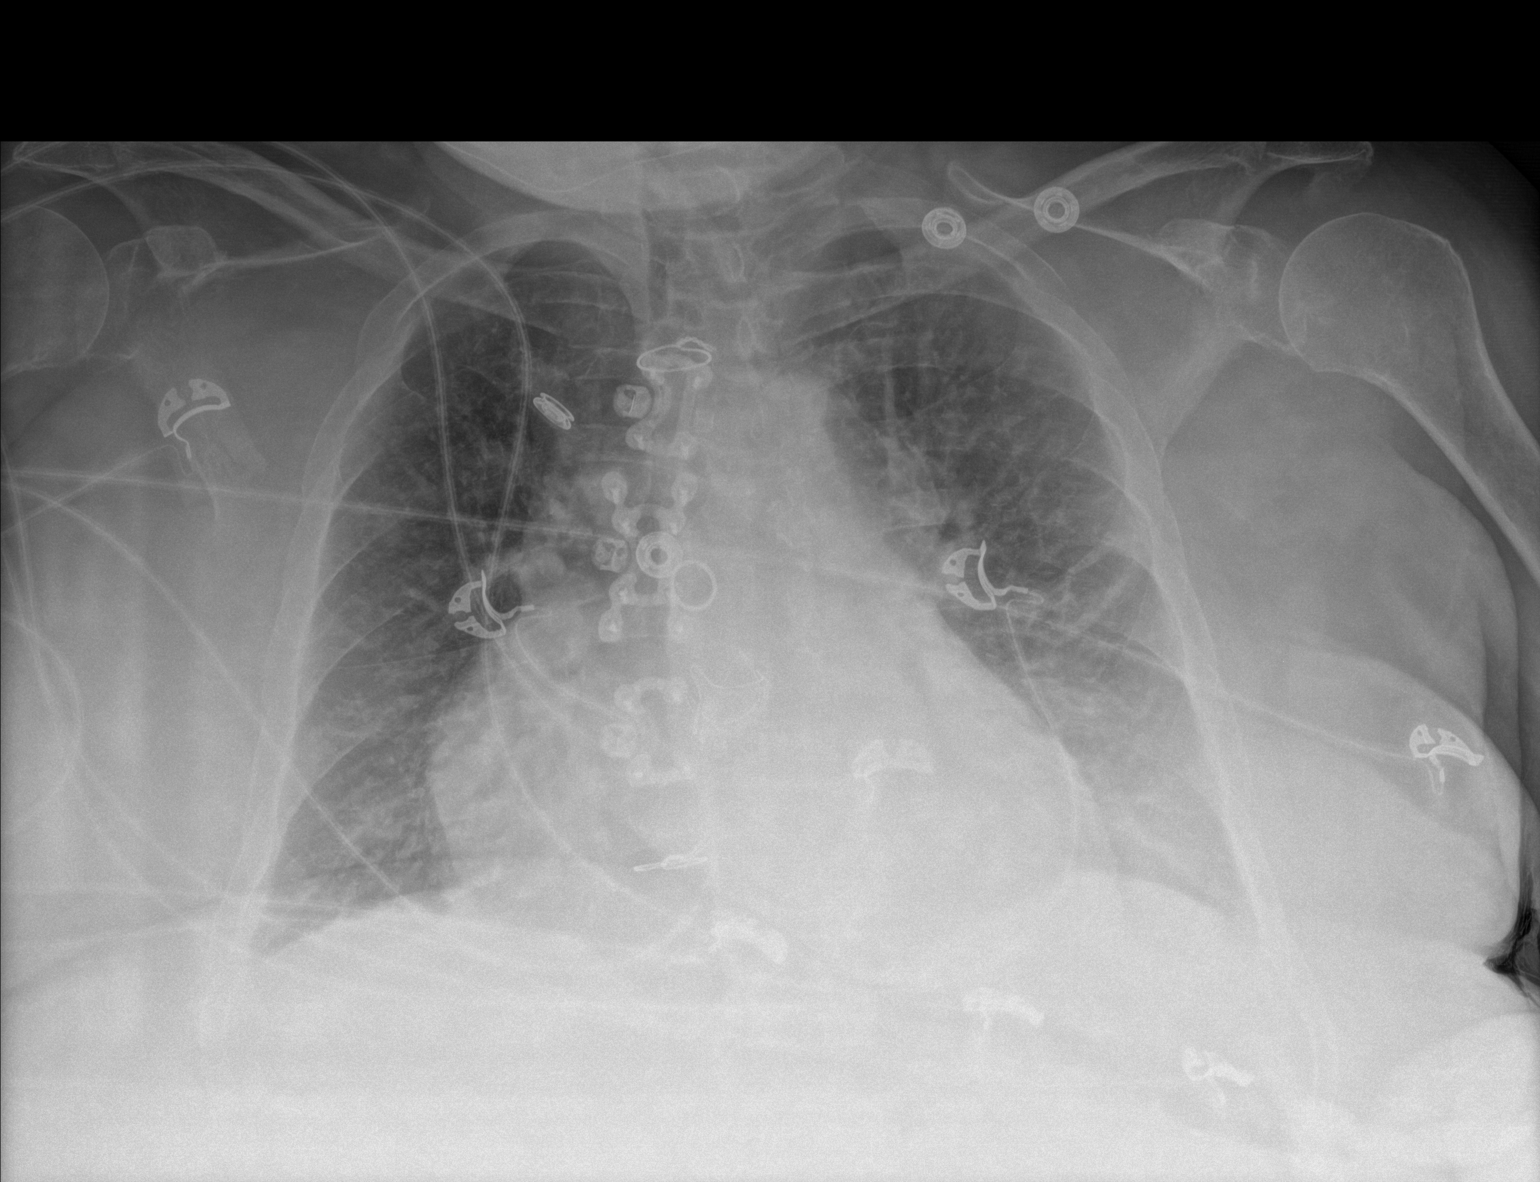

[1 of 1 positions shown; findings below may reference images not displayed]

FINDINGS: Prior median sternotomy, CABG, and cardiac valve replacement. Stable
cardiomegaly. Low lung volumes. Stay chronic bilateral mild
interstitial prominence. No focal alveolar infiltrate. No pleural
effusion or pneumothorax.
IMPRESSION: 1. Prior median sternotomy, CABG, and cardiac valve replacement.
Stable cardiomegaly.

2. Low lung volumes with stable chronic bilateral mild interstitial
prominence. No acute alveolar infiltrate noted.

## 2019-11-25 IMAGING — DX DG CHEST 1V PORT
1 series · 1 of 1 positions shown · non-contrast
Comparison: Yesterday

CLINICAL DATA: Acute respiratory failure

EXAM:
PORTABLE CHEST 1 VIEW

[chest ap]
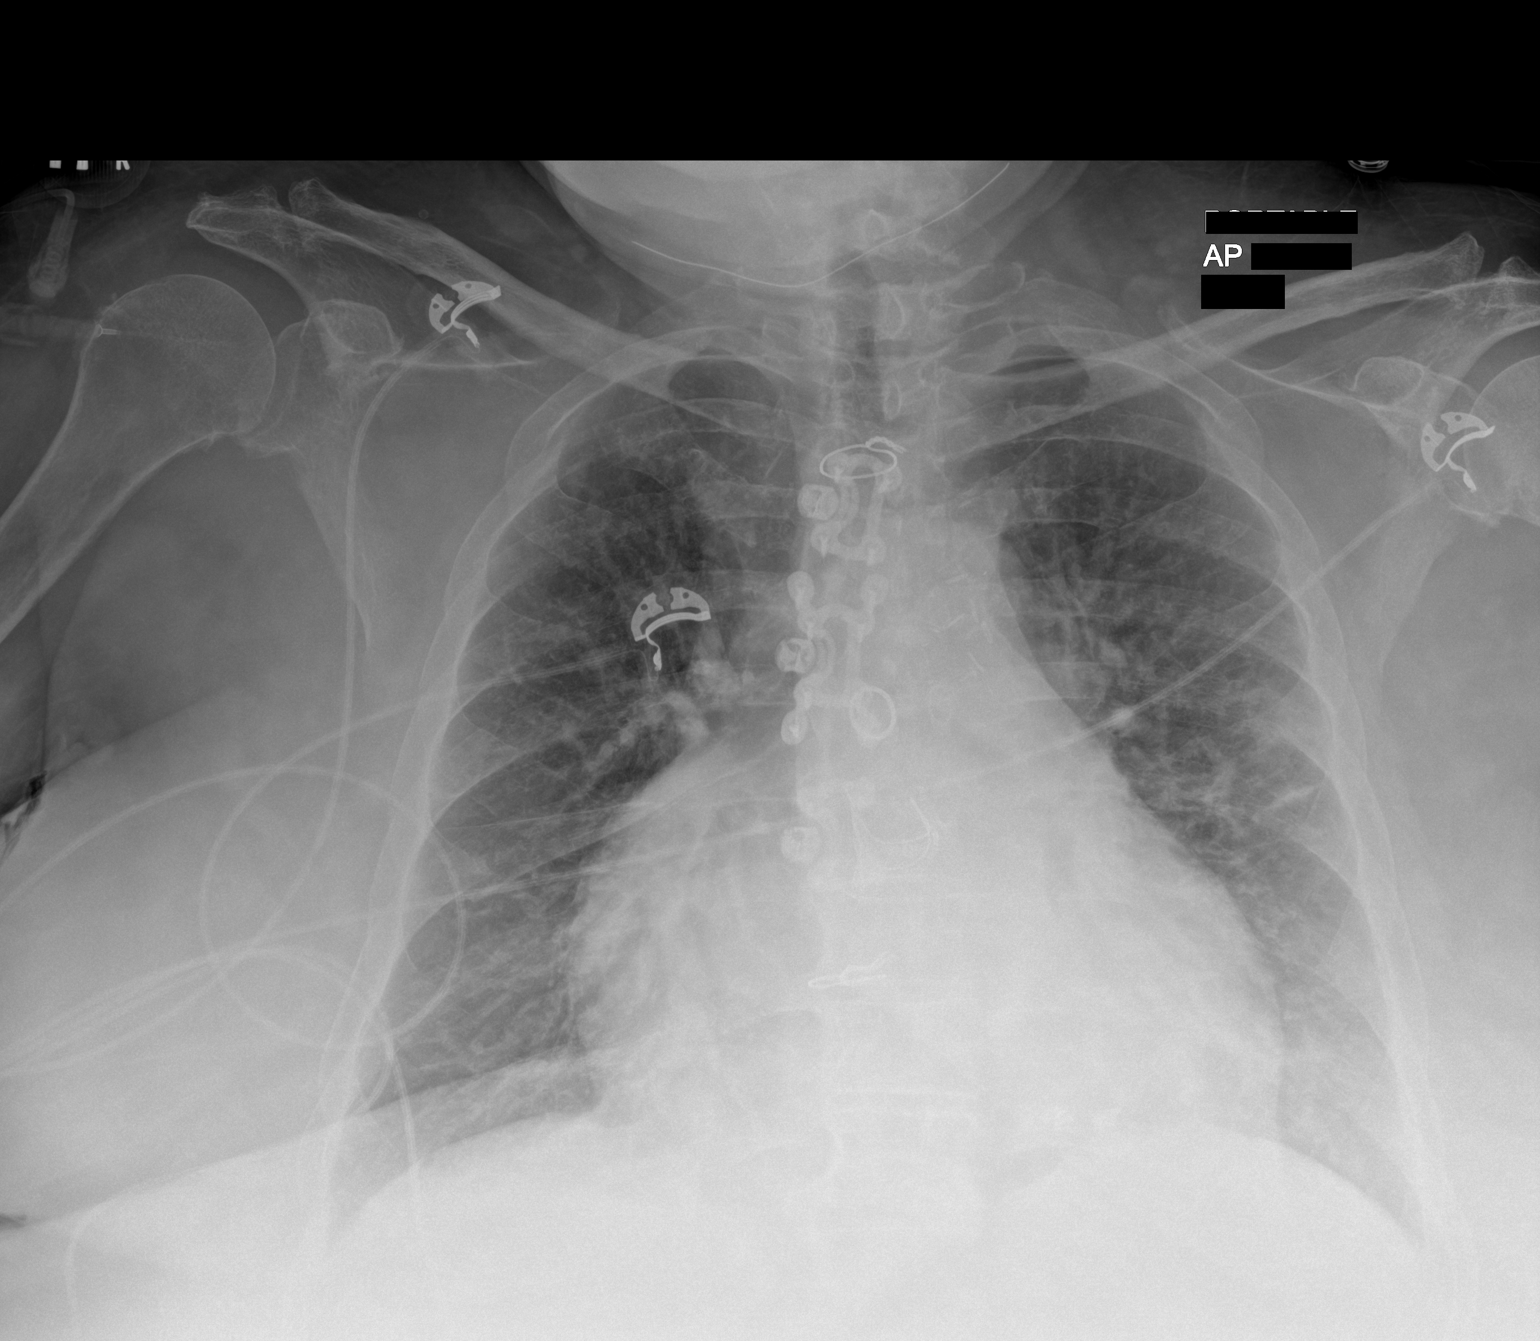

[1 of 1 positions shown; findings below may reference images not displayed]

FINDINGS: Chronic cardiomegaly. There has been CABG and aortic valve
replacement. Vascular congestion. Mild reticulation in the left mid
lung, likely scarring. There is no edema, consolidation, effusion,
or pneumothorax.
IMPRESSION: Cardiomegaly and vascular congestion.

## 2019-11-25 IMAGING — DX DG ABDOMEN 1V
2 series · 2 of 2 positions shown · non-contrast
Comparison: Yesterday

CLINICAL DATA: Ileus

EXAM:
ABDOMEN - 1 VIEW

[abdomen supine (1 of 2)]
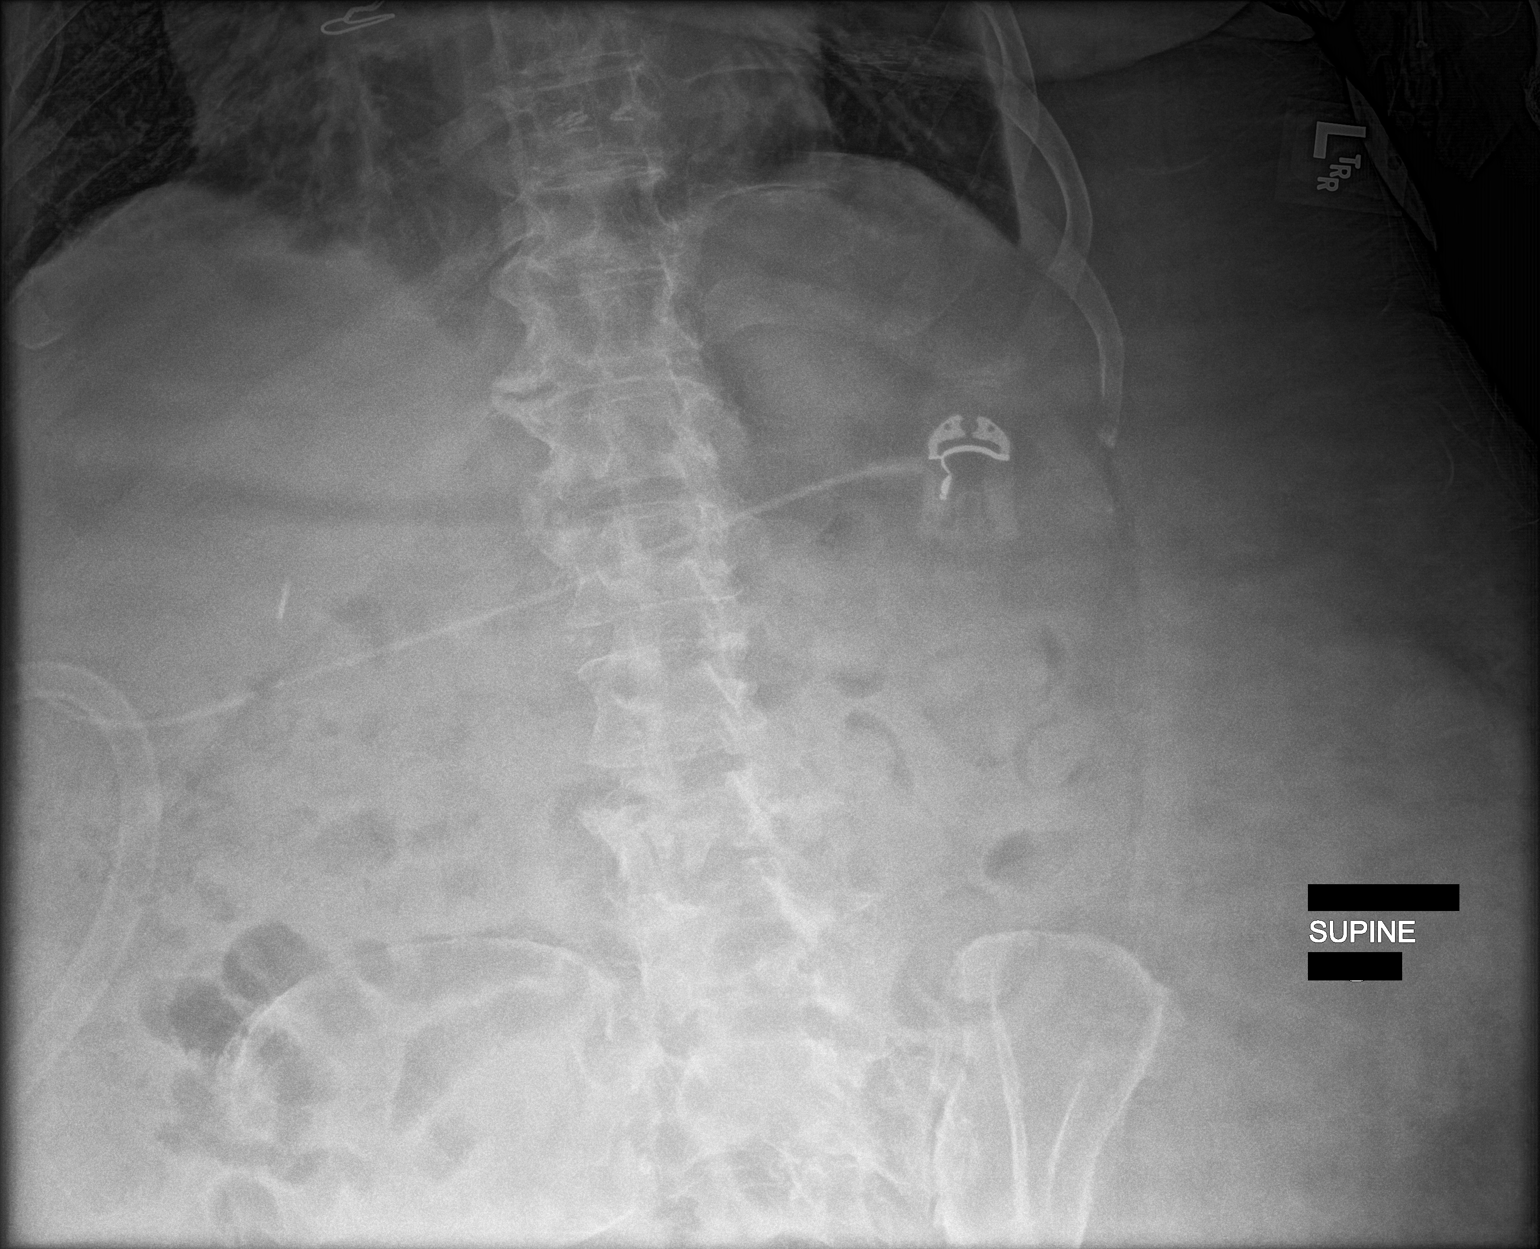

[abdomen supine (2 of 2)]
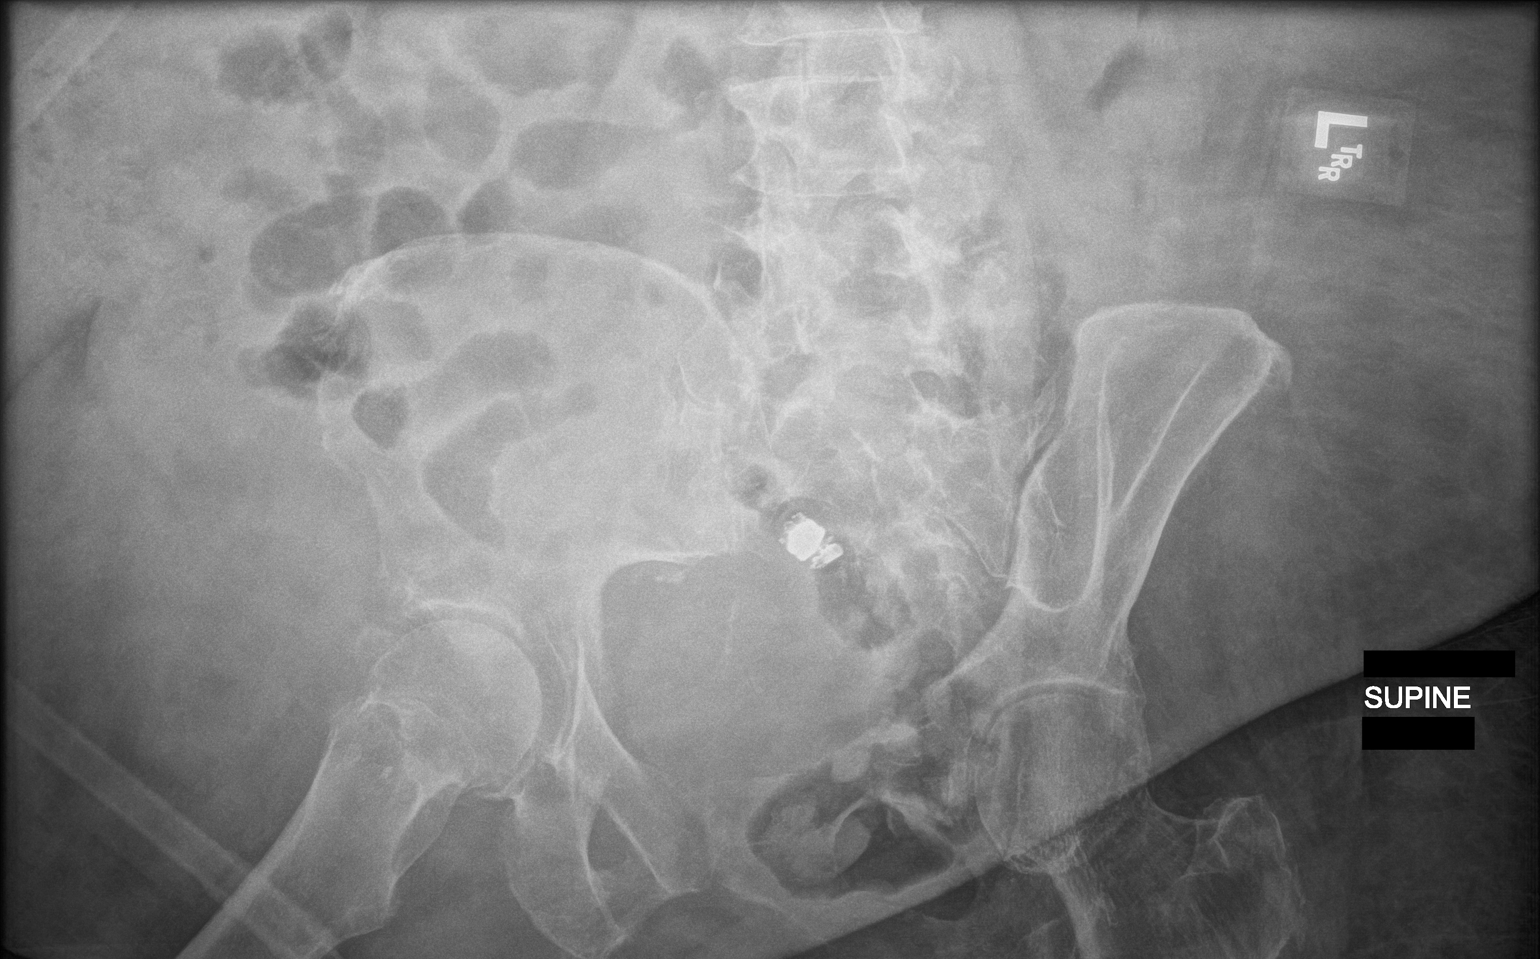

[2 of 2 positions shown; findings below may reference images not displayed]

FINDINGS: No dilated bowel. Moderate stool intermittently seen along the
colon. No concerning mass effect or calcification. High-density
material over the pelvis was not seen on abdominal CT from 4 days
ago and has progressed by radiography.
IMPRESSION: 1. Nonobstructive bowel gas pattern.
2. Moderate stool volume.
3. A high-density structure is progressing through the colon, now at
the rectum. Is there a missing dental cap?

## 2019-12-15 IMAGING — CT CT HEAD W/O CM
4 of 5 series · 15 of 47 positions shown, 17 images · non-contrast
Comparison: August 18, 2018

CLINICAL DATA: Altered mental status

EXAM:
CT HEAD WITHOUT CONTRAST
TECHNIQUE: Contiguous axial images were obtained from the base of the skull
through the vertex without intravenous contrast.

[Series 3: head without · axial · non-contrast · 0.42mm/px · z∈[-162,-67]mm · 4 of 33 slices shown (1 of 2)]
[im 7/33  brain]
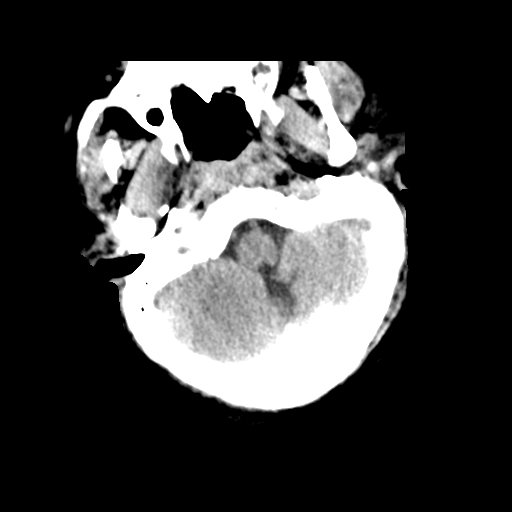
[im 13/33  brain]
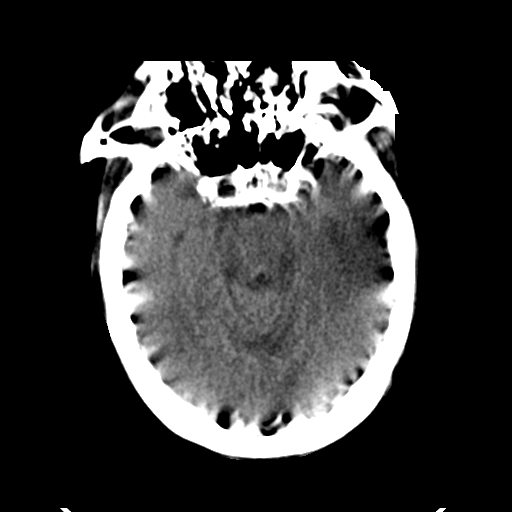
[im 20/33  brain]
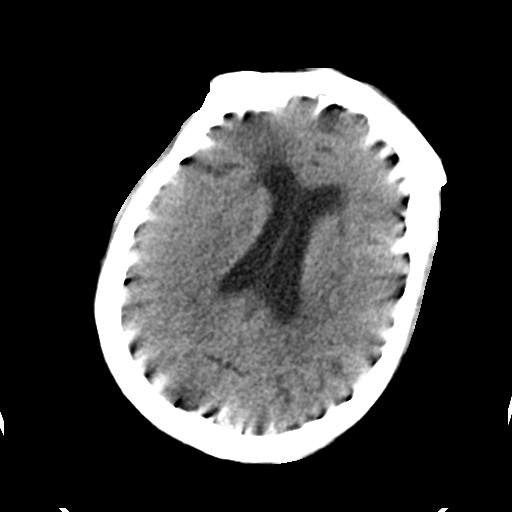
[im 26/33  brain]
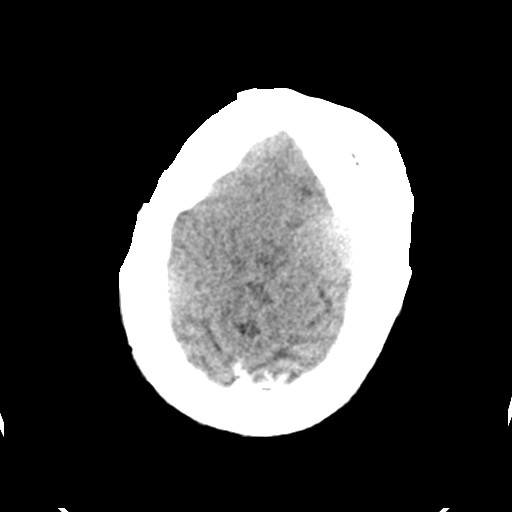

[Series 4: head without · axial · non-contrast · 0.42mm/px · z∈[-167,-57]mm · 5 of 34 slices shown, 7 images (2 of 2)]
[im 6/34  brain]
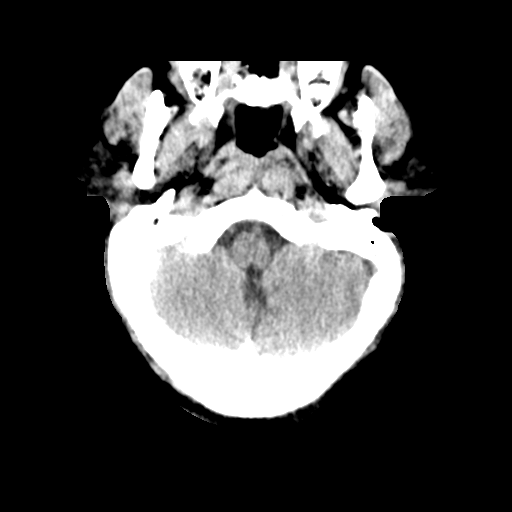
[im 6/34  bone]
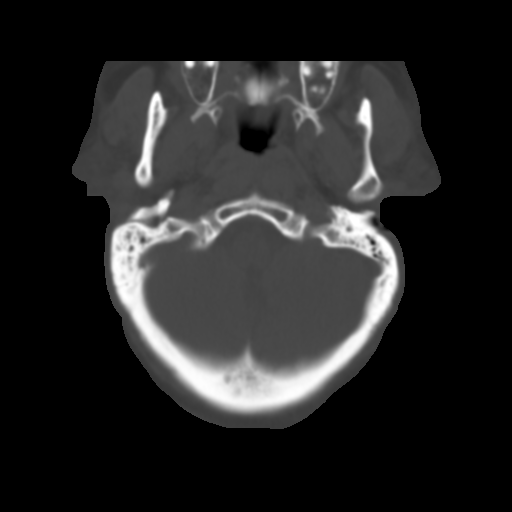
[im 12/34  brain]
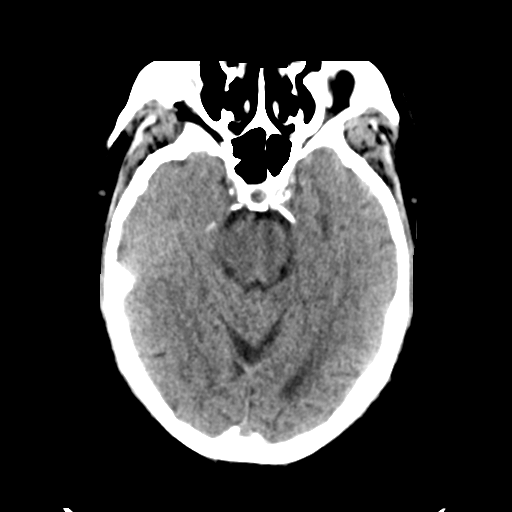
[im 17/34  brain]
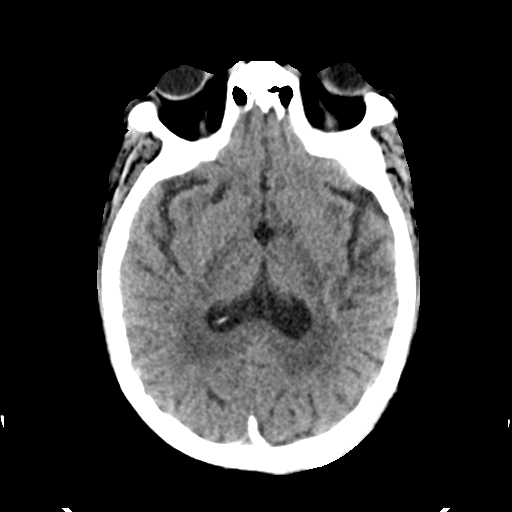
[im 23/34  brain]
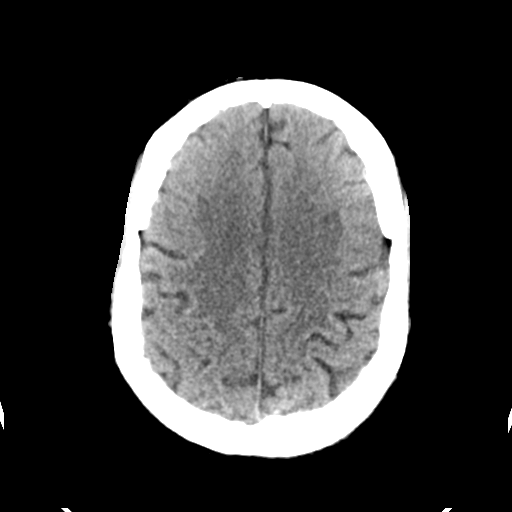
[im 28/34  brain]
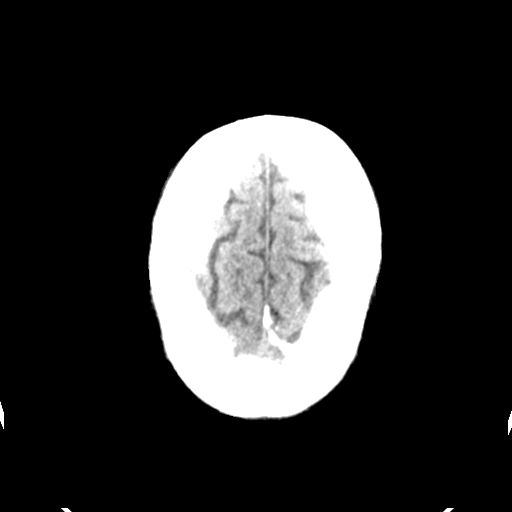
[im 28/34  bone]
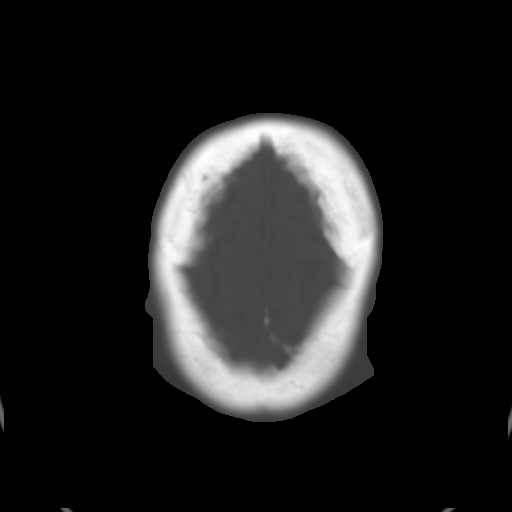

[Series 6: head without cor · coronal · non-contrast · 0.33mm/px · 3 of 67 slices shown]
[im 23/67  brain]
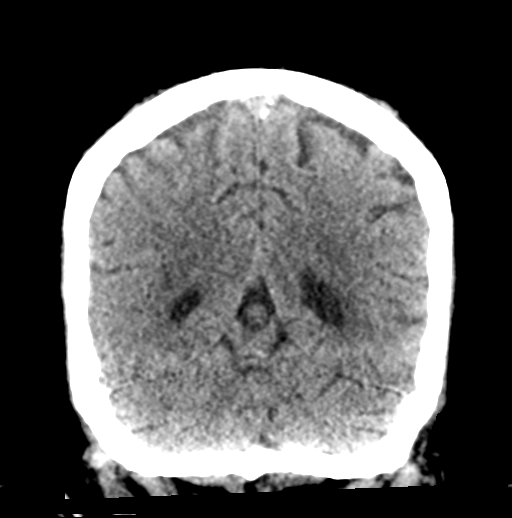
[im 30/67  brain]
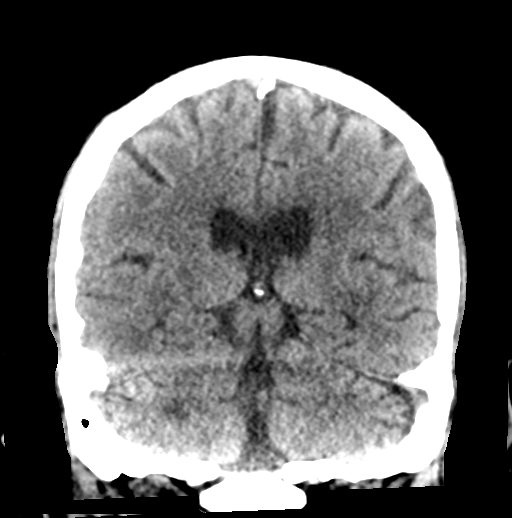
[im 37/67  brain]
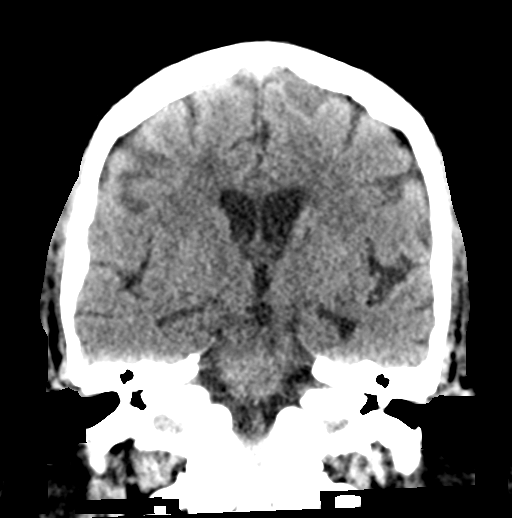

[Series 7: head without sag · sagittal · non-contrast · 0.31mm/px · 3 of 67 slices shown]
[im 23/67  brain]
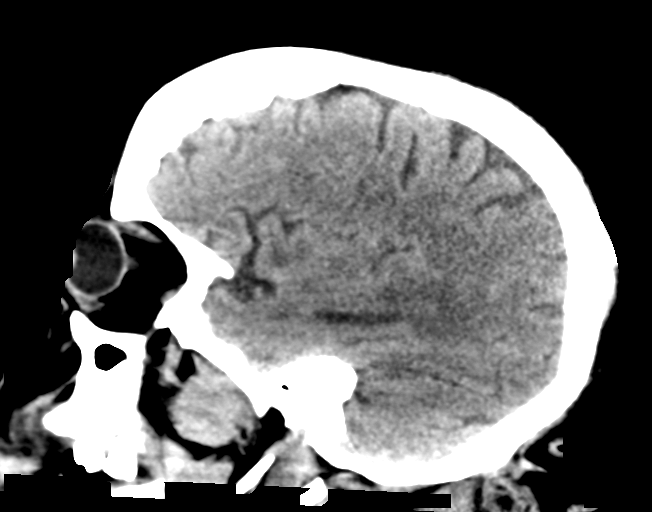
[im 34/67  brain]
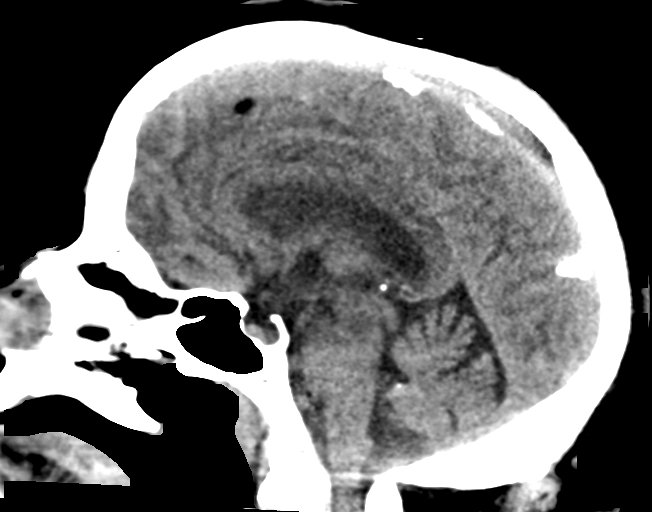
[im 45/67  brain]
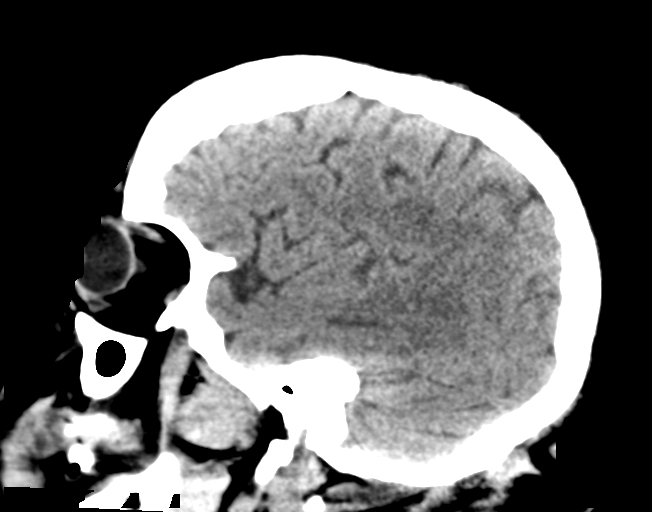

[15 of 47 positions shown; findings below may reference images not displayed]

FINDINGS: Brain: Mild diffuse atrophy is stable. There is no intracranial
mass, hemorrhage, extra-axial fluid collection, or midline shift.
There is patchy small vessel disease throughout the centra semiovale
bilaterally. There is a prior small lacunar infarct in the
posterosuperior right centrum semiovale, stable. There is evidence
of prior infarcts in the anterior limbs of the right internal and
external capsule. There is also evidence of a prior infarct in the
anterior limb and genu of the left internal capsule. No acute
infarct is demonstrable on this study.

Vascular: No hyperdense vessel. There is calcification in the
carotid siphon regions bilaterally.

Skull: The bony calvarium appears intact.

Sinuses/Orbits: There is mucosal thickening in several ethmoid air
cells. Other paranasal sinuses are clear. Orbits appear symmetric
bilaterally.

Other: Mastoids are hypoplastic but clear.
IMPRESSION: Mild atrophy with patchy supratentorial small vessel disease and
stable prior small scattered infarcts. No acute infarct evident. No
mass or hemorrhage.

Foci of arterial vascular calcification noted. Mucosal thickening
noted in several ethmoid air cells.

## 2019-12-15 IMAGING — DX DG CHEST 1V PORT
1 series · 1 of 1 positions shown · non-contrast
Comparison: 10/31/2018

CLINICAL DATA: Mental status changes.

EXAM:
PORTABLE CHEST 1 VIEW

[chest ap]
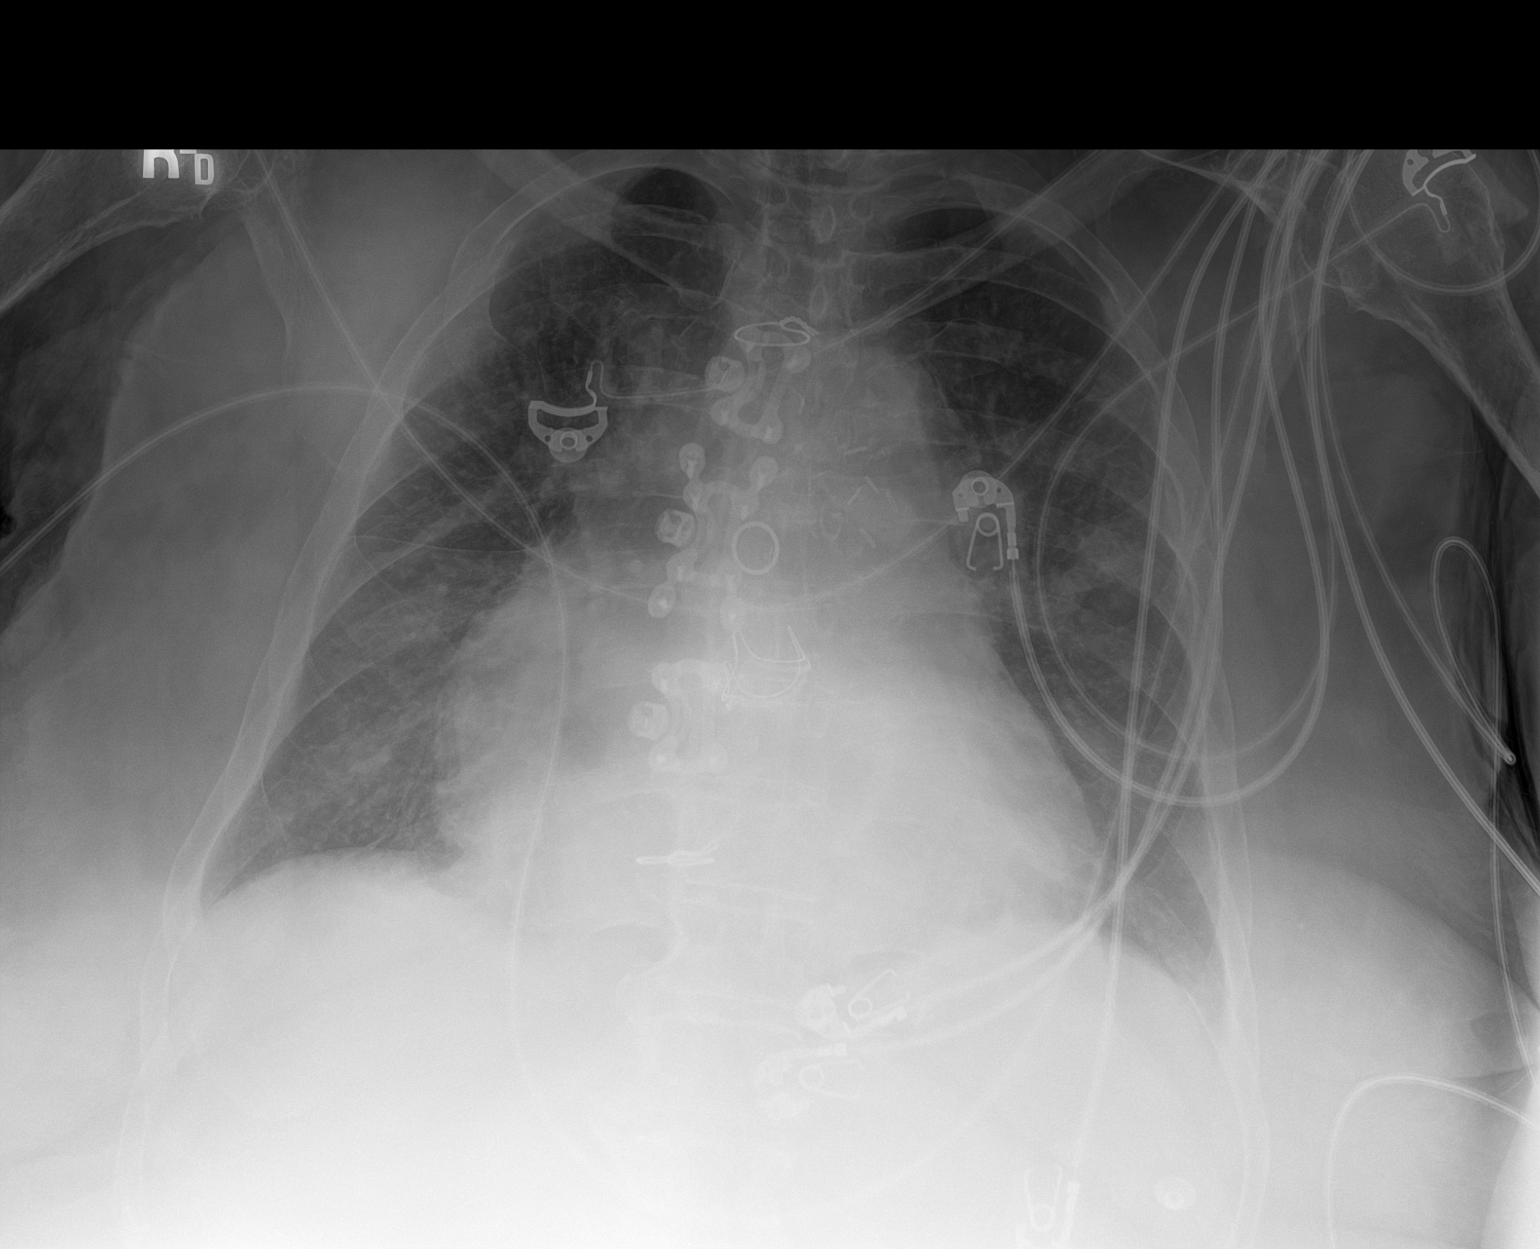

[1 of 1 positions shown; findings below may reference images not displayed]

FINDINGS: The heart is enlarged but stable. Stable mild tortuosity and
calcification of the thoracic aorta. Stable surgical changes
involving the sternum and evidence of prosthetic aortic valve.
Bypass surgery changes are also noted.

No acute pulmonary findings. No pleural effusions. No worrisome
pulmonary lesions.
IMPRESSION: No acute cardiopulmonary findings.

## 2019-12-18 IMAGING — DX DG ABD PORTABLE 1V
1 series · 1 of 1 positions shown · non-contrast
Comparison: 10/31/2018

CLINICAL DATA: Feeding tube placement

EXAM:
PORTABLE ABDOMEN - 1 VIEW

[abdomen kub]
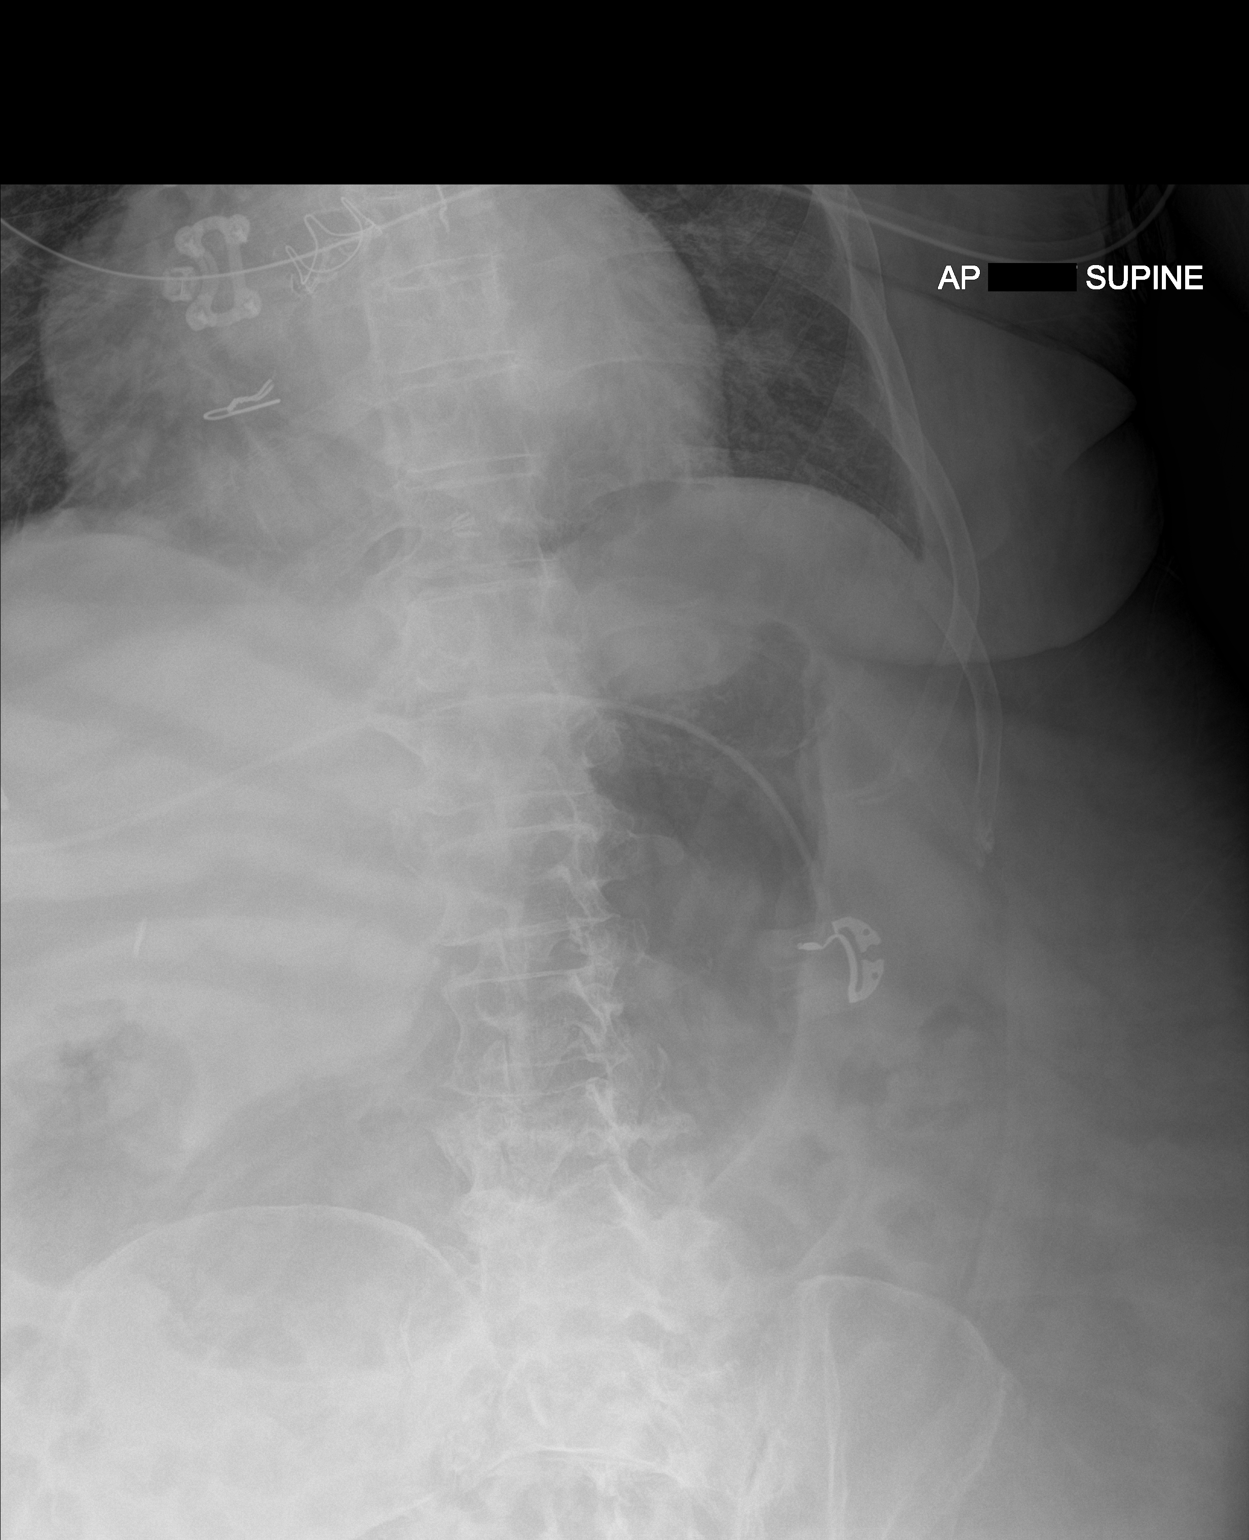

[1 of 1 positions shown; findings below may reference images not displayed]

FINDINGS: The heart appears enlarged. Tip of the esophageal tube overlies the
distal mediastinum. Upper gas pattern is unremarkable
IMPRESSION: Tip of the esophageal tube overlies the distal mediastinum. Further
advancement by at least 15-20 cm is suggested, repeat radiograph for
positioning would be advised after tube adjustment

These results will be called to the ordering clinician or
representative by the Radiologist Assistant, and communication
documented in the PACS or zVision Dashboard.

## 2019-12-18 IMAGING — DX DG ABD PORTABLE 1V
1 series · 1 of 1 positions shown · non-contrast
Comparison: November 23, 2018

CLINICAL DATA: NG tube placement

EXAM:
PORTABLE ABDOMEN - 1 VIEW

[abdomen kub]
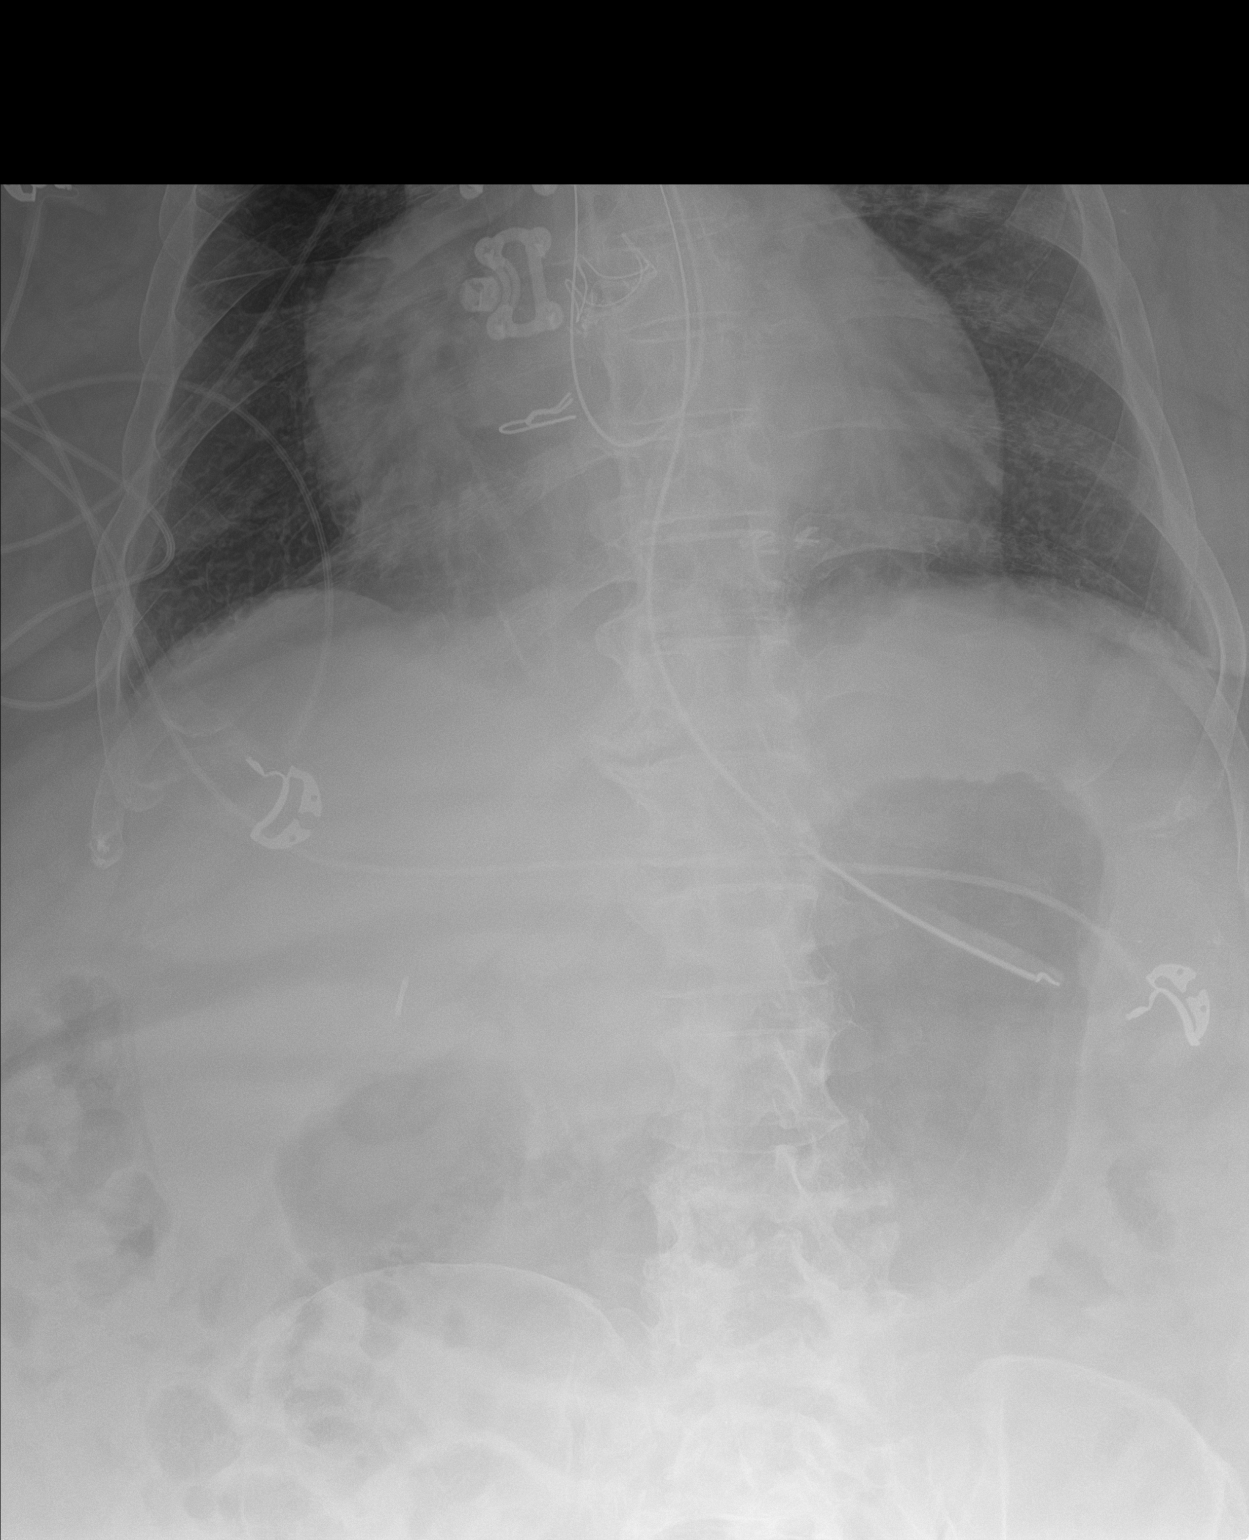

[1 of 1 positions shown; findings below may reference images not displayed]

FINDINGS: The enteric tube tip projects over the gastric body. There is a
catheter that appears looped over the esophagus. This is not fully
visualized on this study. The heart size is enlarged.
IMPRESSION: 1. Enteric tube projects over the gastric body.
2. Second catheter or line projects over the esophagus and is only
partially visualized. This may be external to the patient. Clinical
correlation is recommended.

## 2019-12-21 IMAGING — NM NM GI BLOOD LOSS
2 series · 12 of 12 positions shown · non-contrast
Comparison: CT scan 10/26/2010

CLINICAL DATA: Chronic iron deficiency anemia with GI blood loss
attributed to duodenal AVM.

EXAM:
NUCLEAR MEDICINE GASTROINTESTINAL BLEEDING SCAN
TECHNIQUE: Sequential abdominal images were obtained following intravenous
administration of Gc-22m labeled red blood cells.
RADIOPHARMACEUTICALS:  25.4 mCi Gc-22m pertechnetate in-vitro
labeled red cells.

[gi gi bleed · 4.52mm/px · 6 of 60 frames shown (1 of 2)]
[frame 6/60]
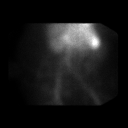
[frame 16/60]
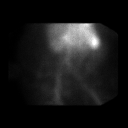
[frame 26/60]
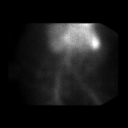
[frame 36/60]
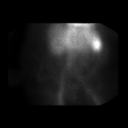
[frame 46/60]
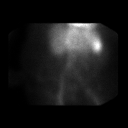
[frame 56/60]
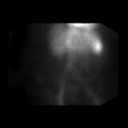

[gi gi bleed · 4.52mm/px · 6 of 60 frames shown (2 of 2)]
[frame 6/60]
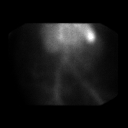
[frame 16/60]
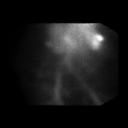
[frame 26/60]
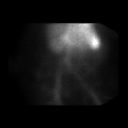
[frame 36/60]
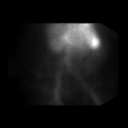
[frame 46/60]
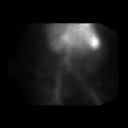
[frame 56/60]
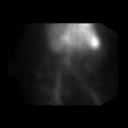

[12 of 12 positions shown; findings below may reference images not displayed]

FINDINGS: Findings no active GI bleeding is demonstrated during the 2 hour
examination.
IMPRESSION: Negative nuclear medicine GI bleeding study.

## 2020-03-19 IMAGING — CR DG THORACIC SPINE 2V
3 series · 3 of 3 positions shown · non-contrast
Comparison: None.

CLINICAL DATA: Back pain after fall

EXAM:
THORACIC SPINE 2 VIEWS

[t-spine ap]
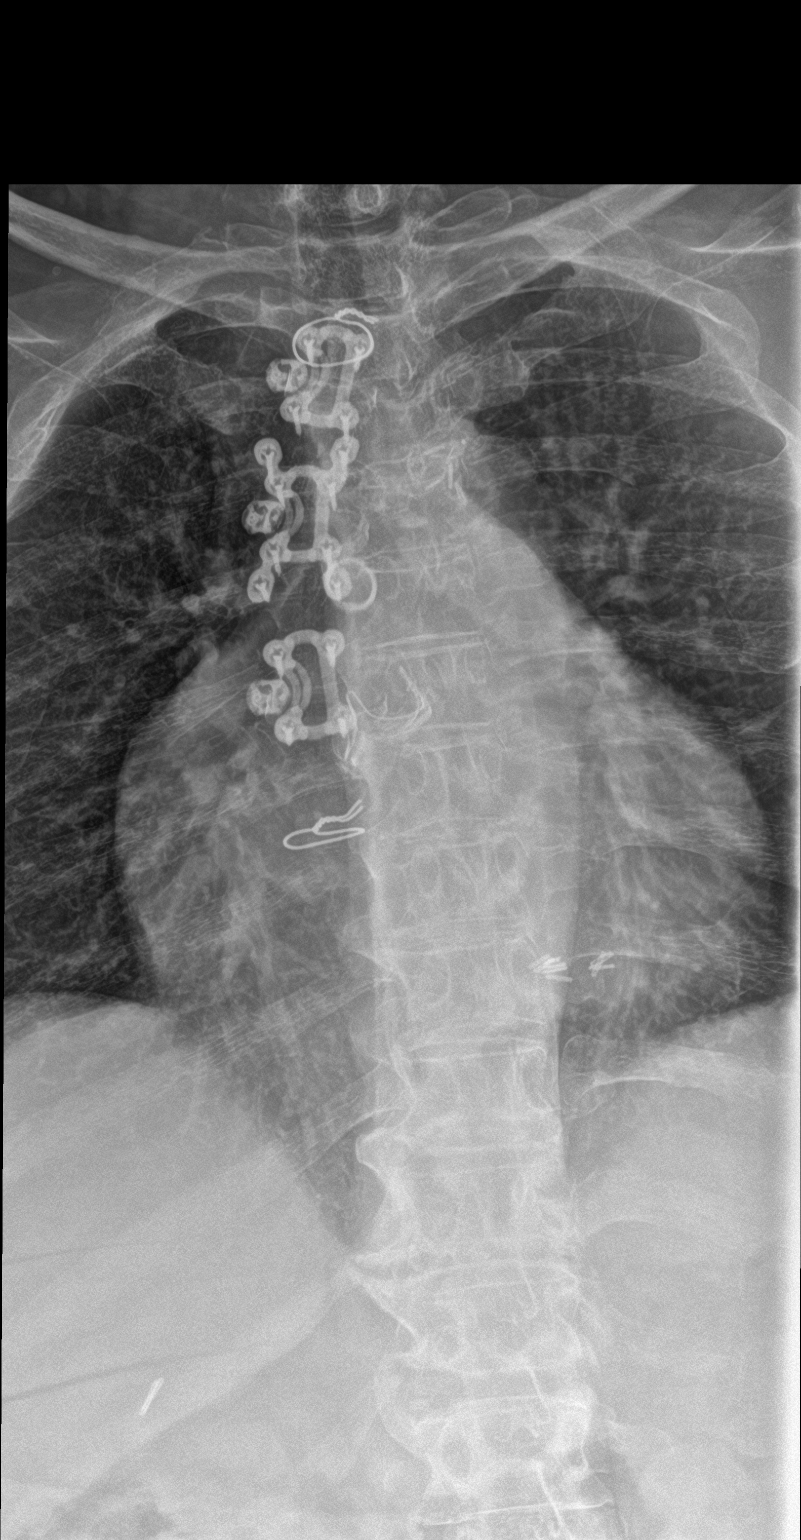

[t-spine lat]
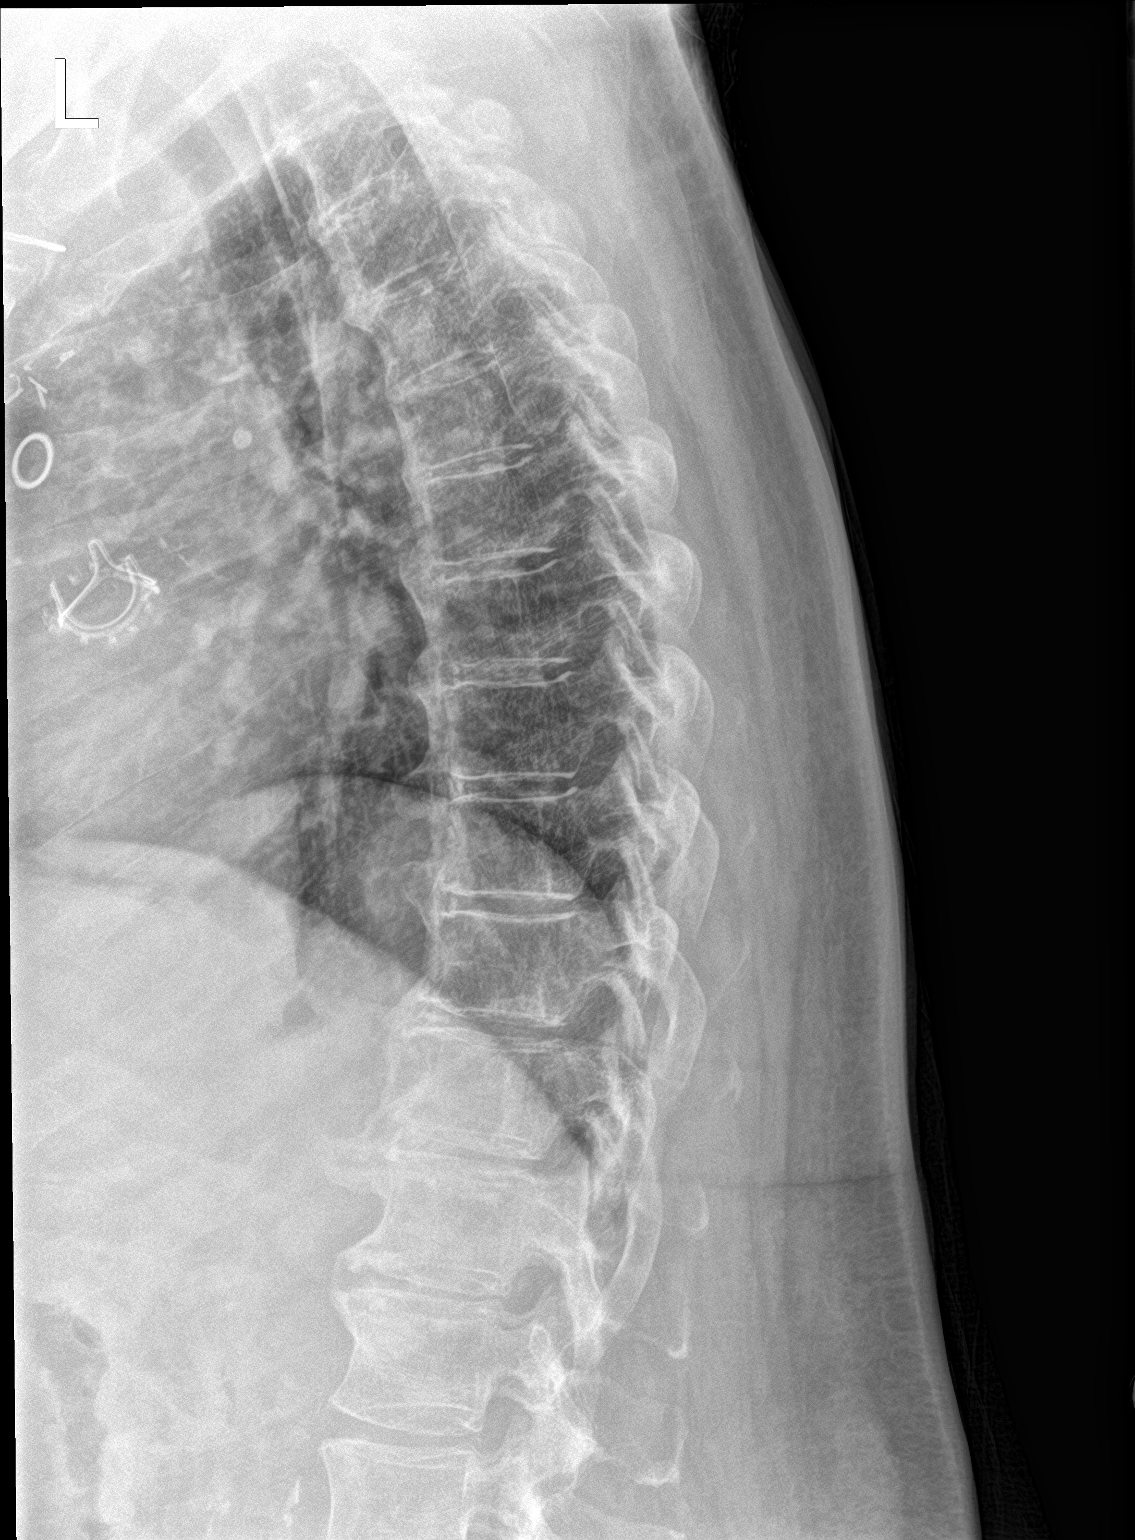

[t-spine swimmers]
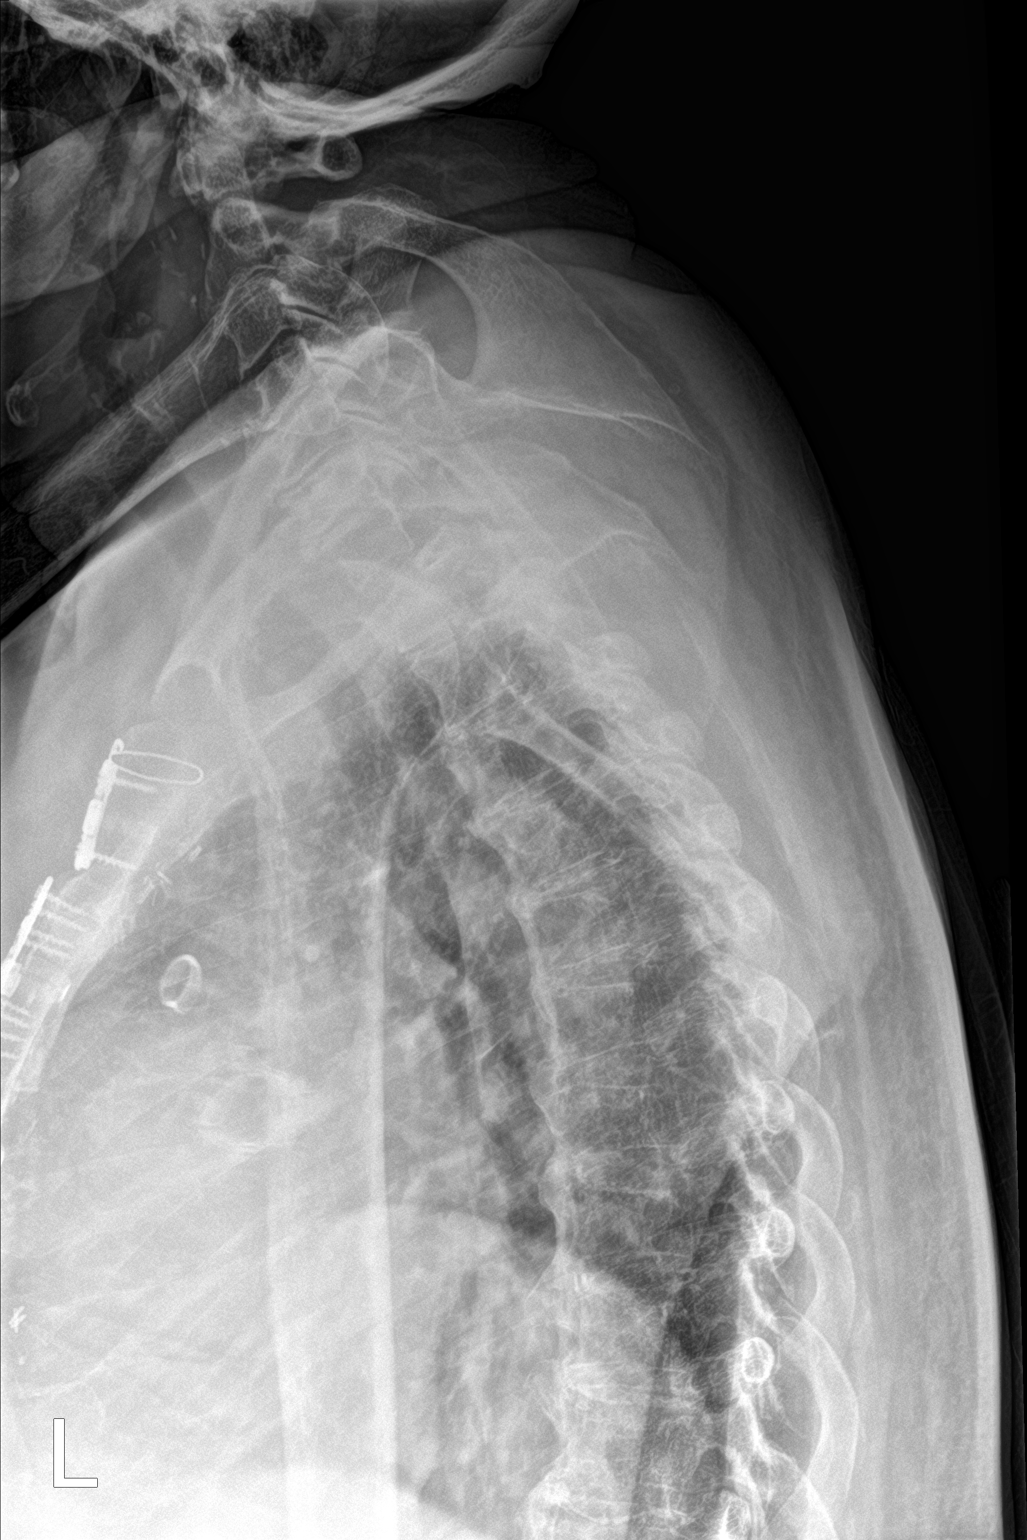

[3 of 3 positions shown; findings below may reference images not displayed]

FINDINGS: Degenerative spurring throughout the thoracic spine. Normal
alignment. No fracture or focal bone lesion.
IMPRESSION: No acute bony abnormality.

## 2020-03-19 IMAGING — DX DG CHEST 1V PORT
1 series · 1 of 1 positions shown · non-contrast
Comparison: 11/20/2018

CLINICAL DATA: Fall

EXAM:
PORTABLE CHEST 1 VIEW

[chest ap]
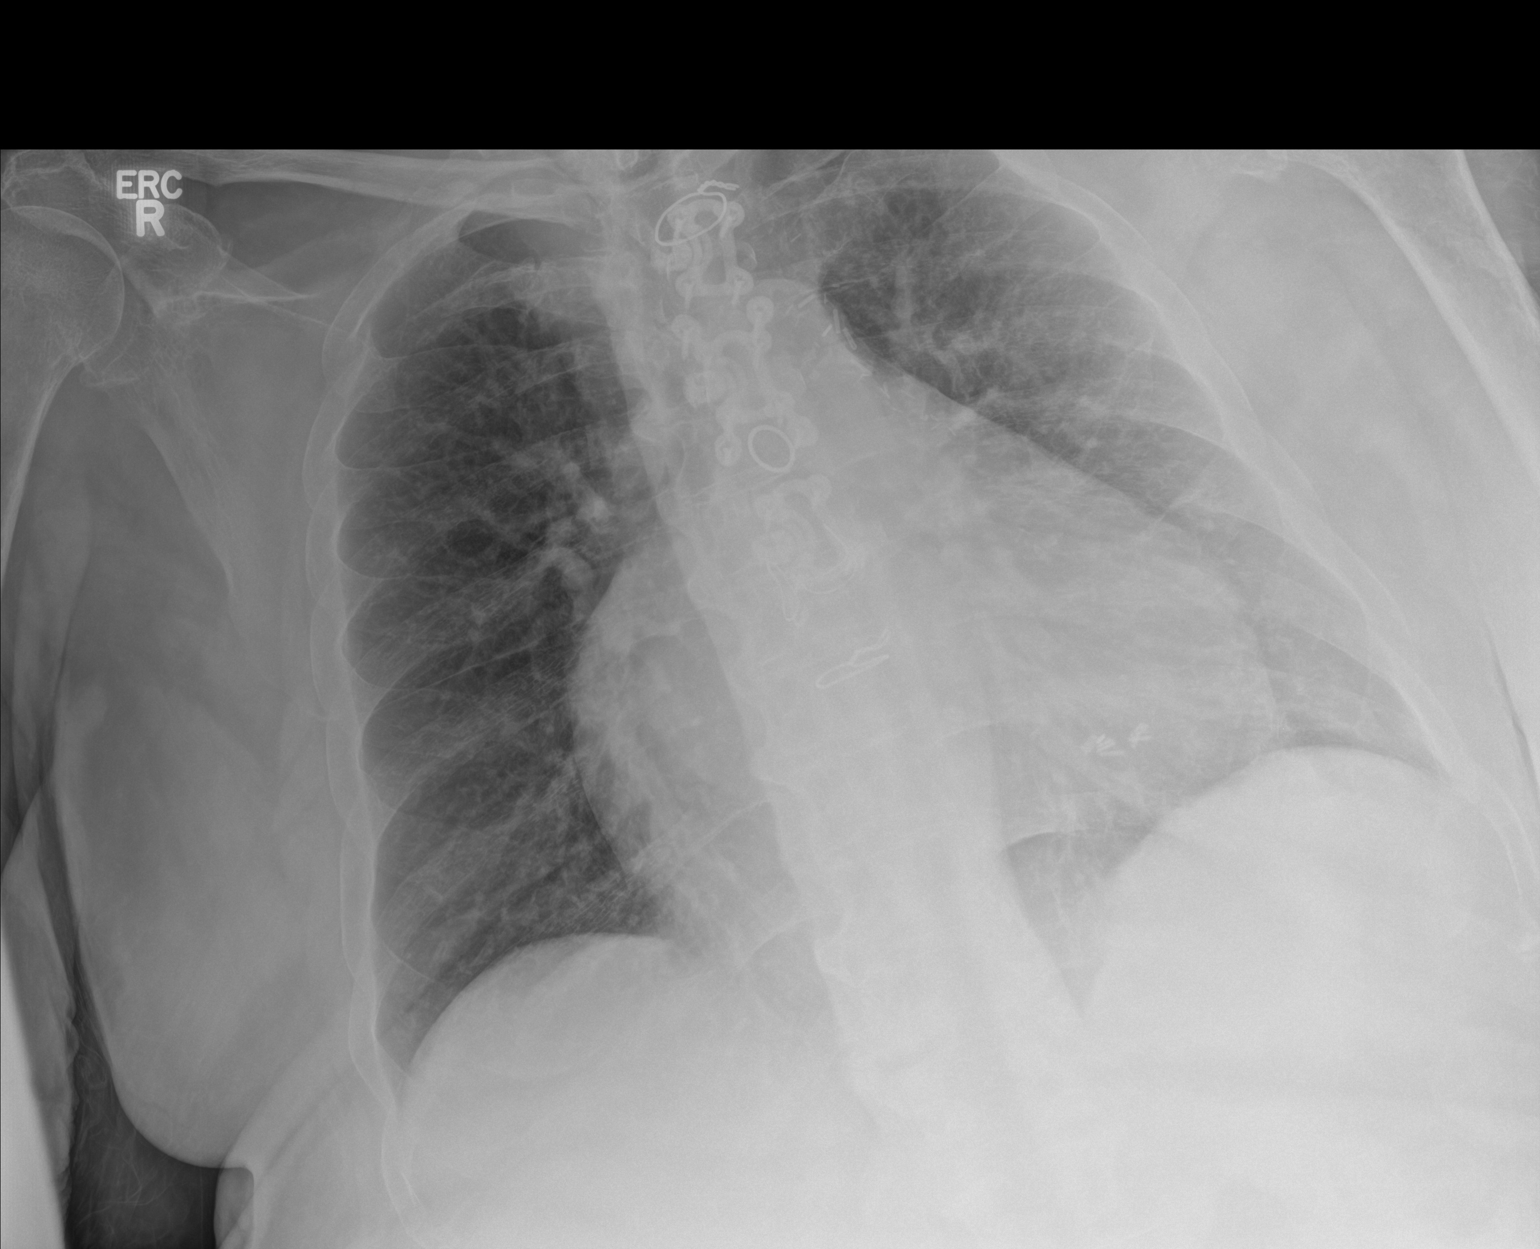

[1 of 1 positions shown; findings below may reference images not displayed]

FINDINGS: Prior median sternotomy. Cardiomegaly. No confluent opacities or
effusions. No edema. No acute bony abnormality.
IMPRESSION: Cardiomegaly.  No active disease.

## 2020-03-19 IMAGING — CR DG CERVICAL SPINE 2 OR 3 VIEWS
3 series · 3 of 3 positions shown · non-contrast
Comparison: 08/25/2008. Swimmer's view is on the thoracic spine
study performed today.

CLINICAL DATA: Pain after fall

EXAM:
CERVICAL SPINE - 2-3 VIEW

[c-spine lat]
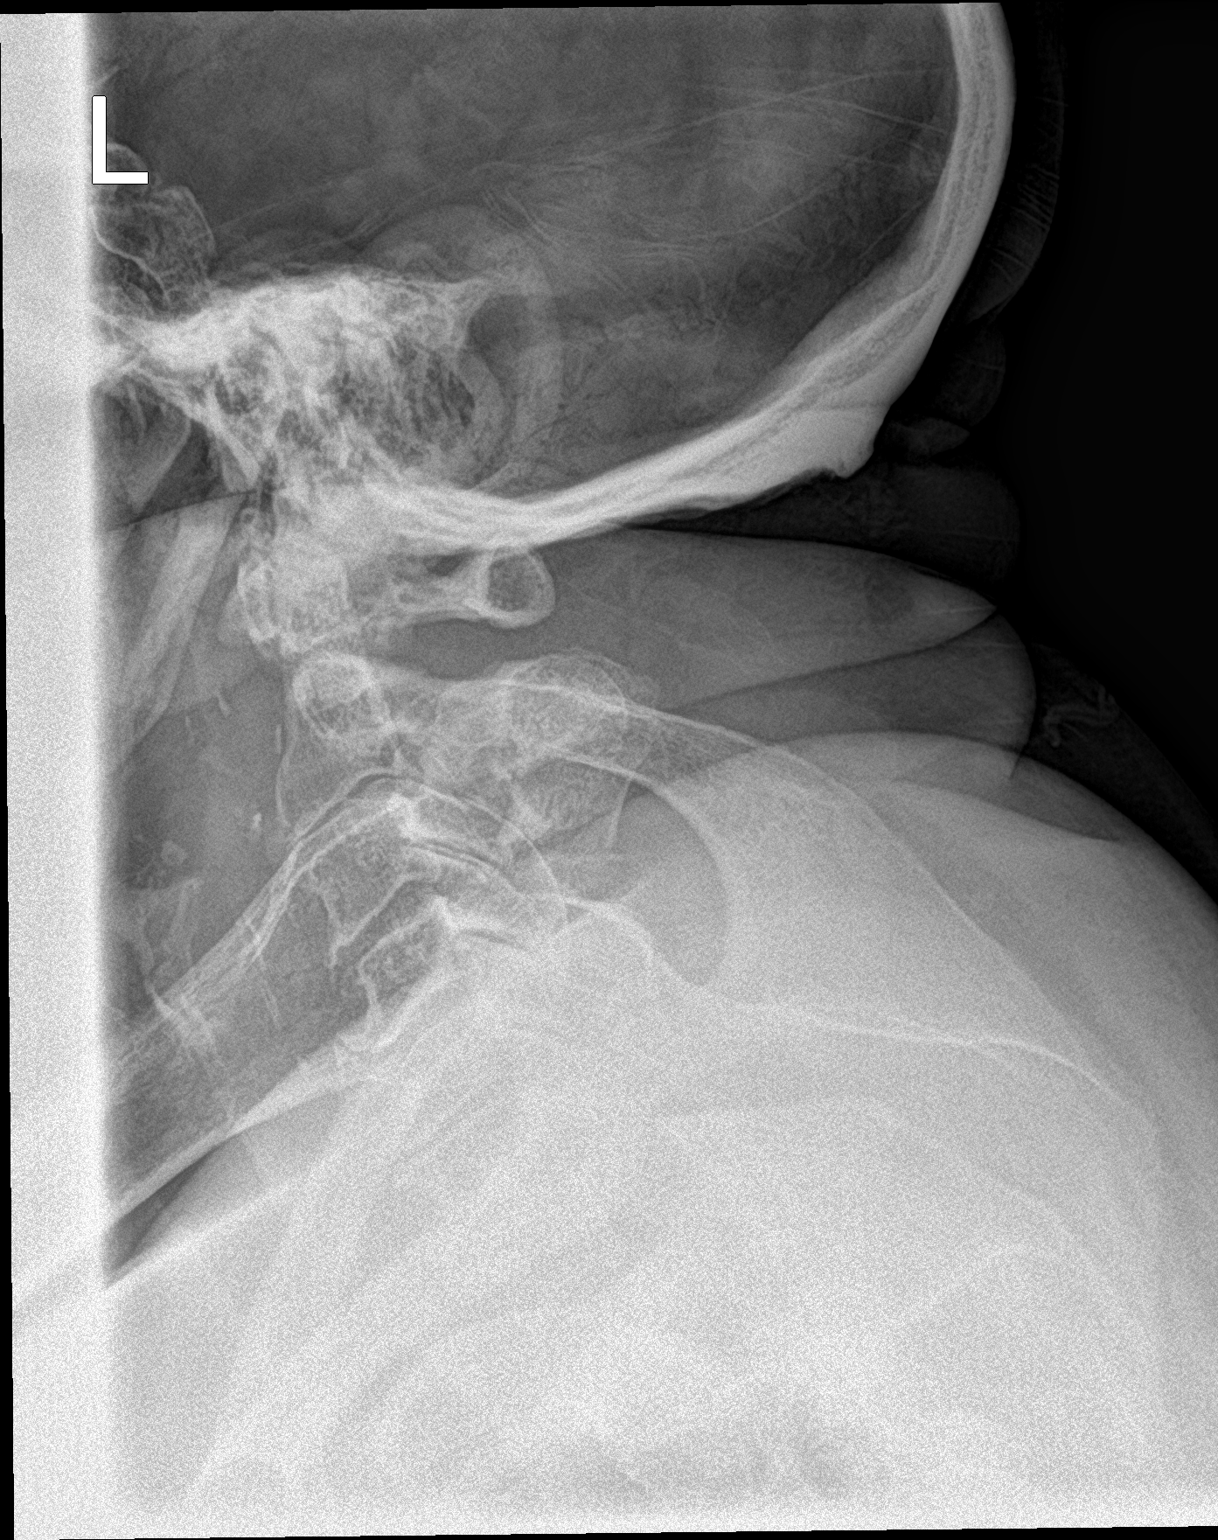

[c-spine ap]
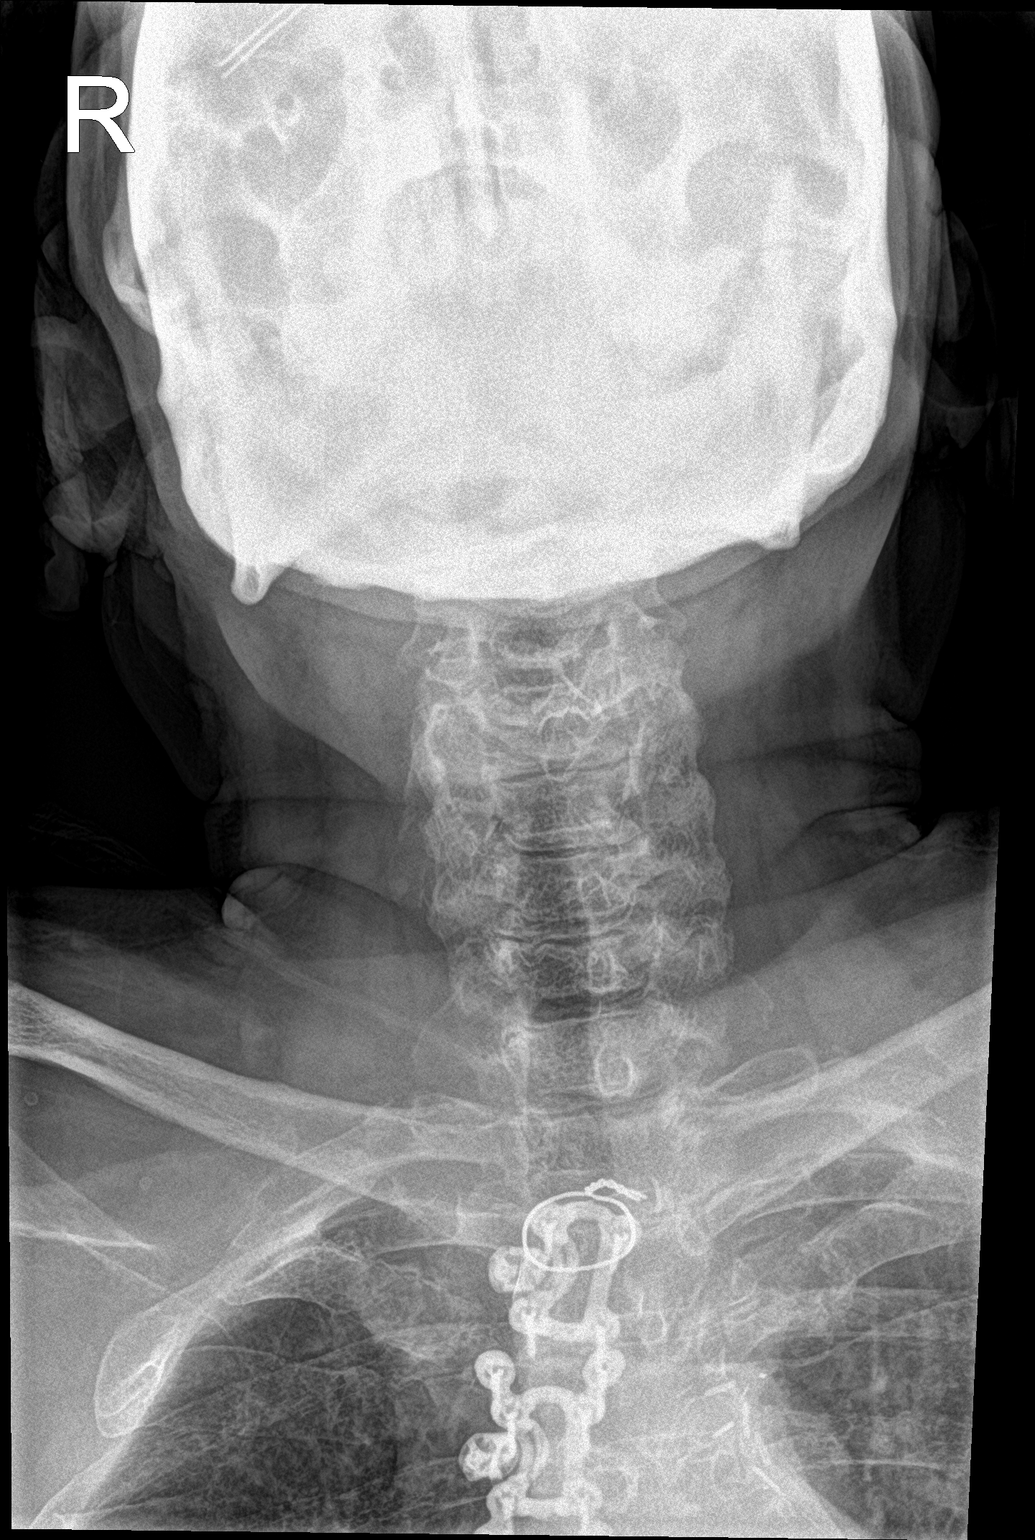

[c-spine open mouth]
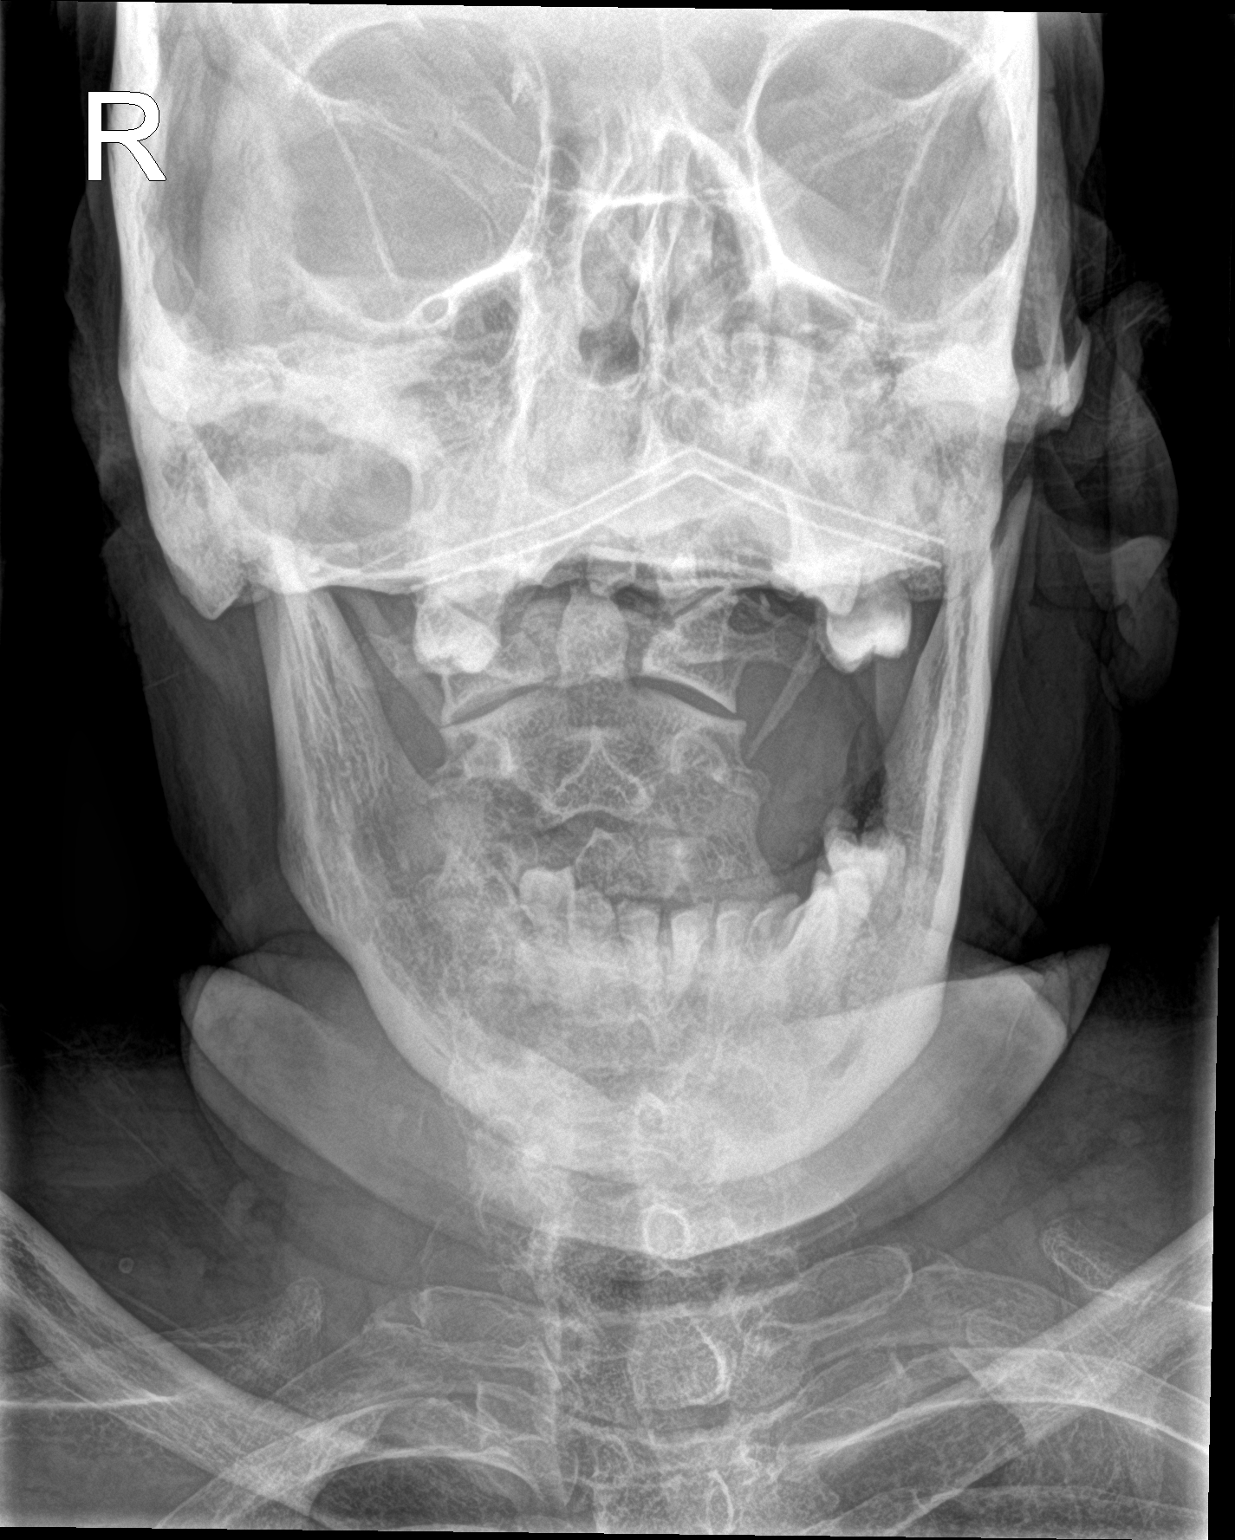

[3 of 3 positions shown; findings below may reference images not displayed]

FINDINGS: Degenerative disc and facet disease diffusely throughout the
cervical spine. No fracture is visualized. There is prominent soft
tissue in the prevertebral region.
IMPRESSION: No visible fracture or subluxation. Prevertebral soft tissue
swelling noted. This could be further evaluated with cervical spine
CT.

## 2020-03-21 IMAGING — DX DG FOOT COMPLETE 3+V*R*
3 series · 3 of 3 positions shown · non-contrast
Comparison: Radiographs 05/13/2008. No recent prior studies.

CLINICAL DATA: Right foot pain. History of diabetes.

EXAM:
RIGHT FOOT COMPLETE - 3+ VIEW

[foot ap]
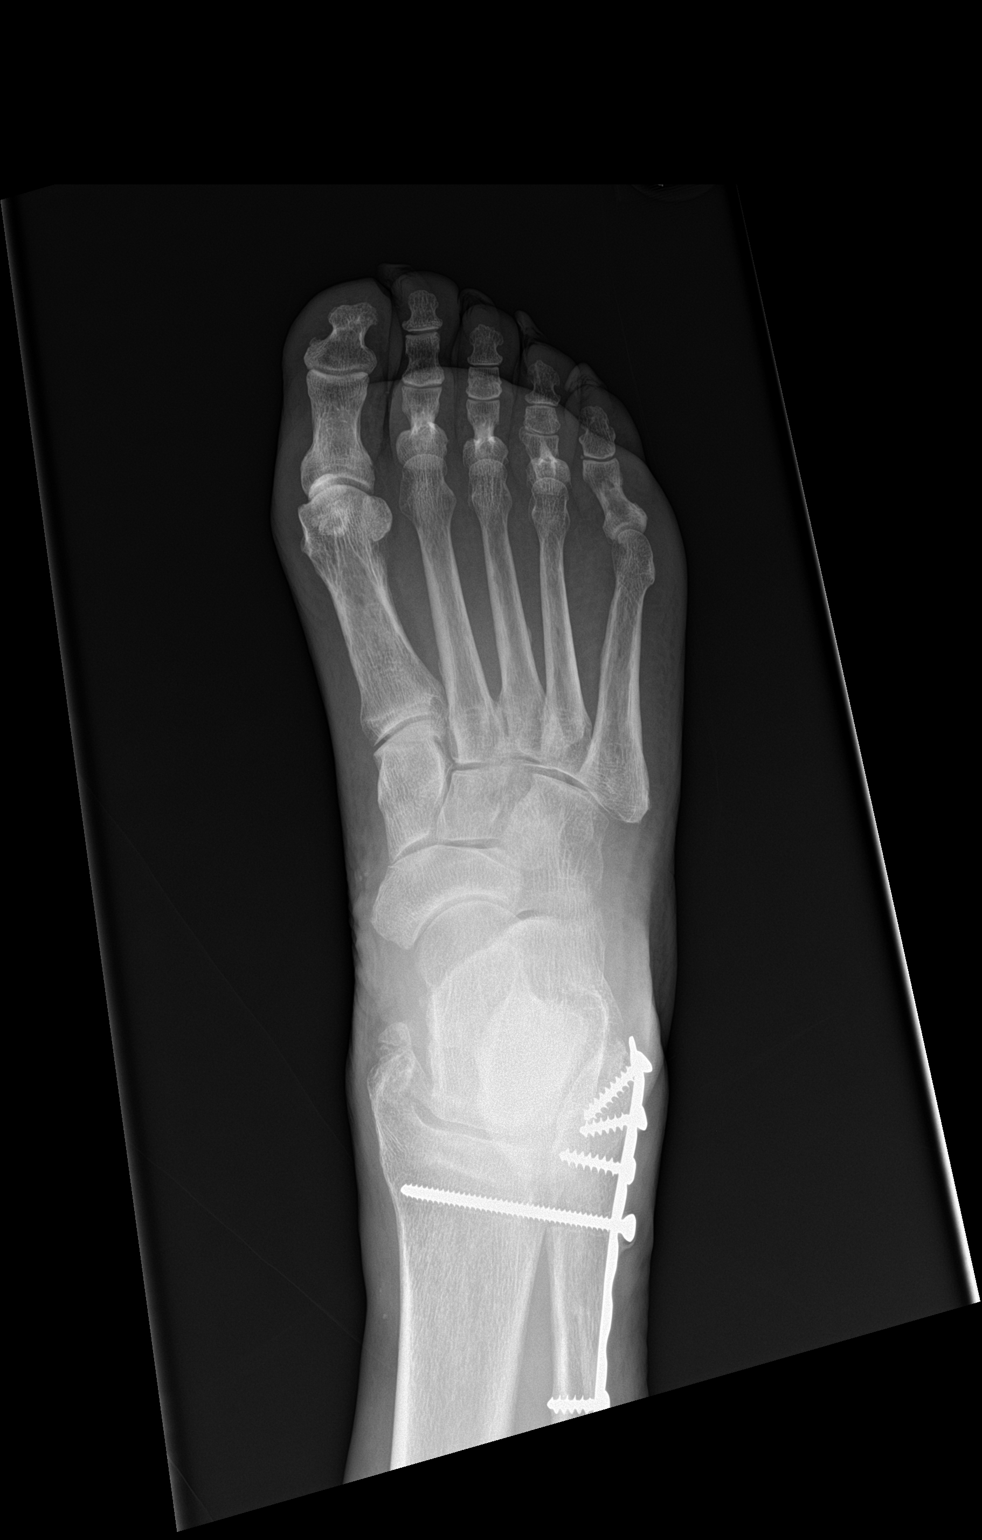

[foot obl]
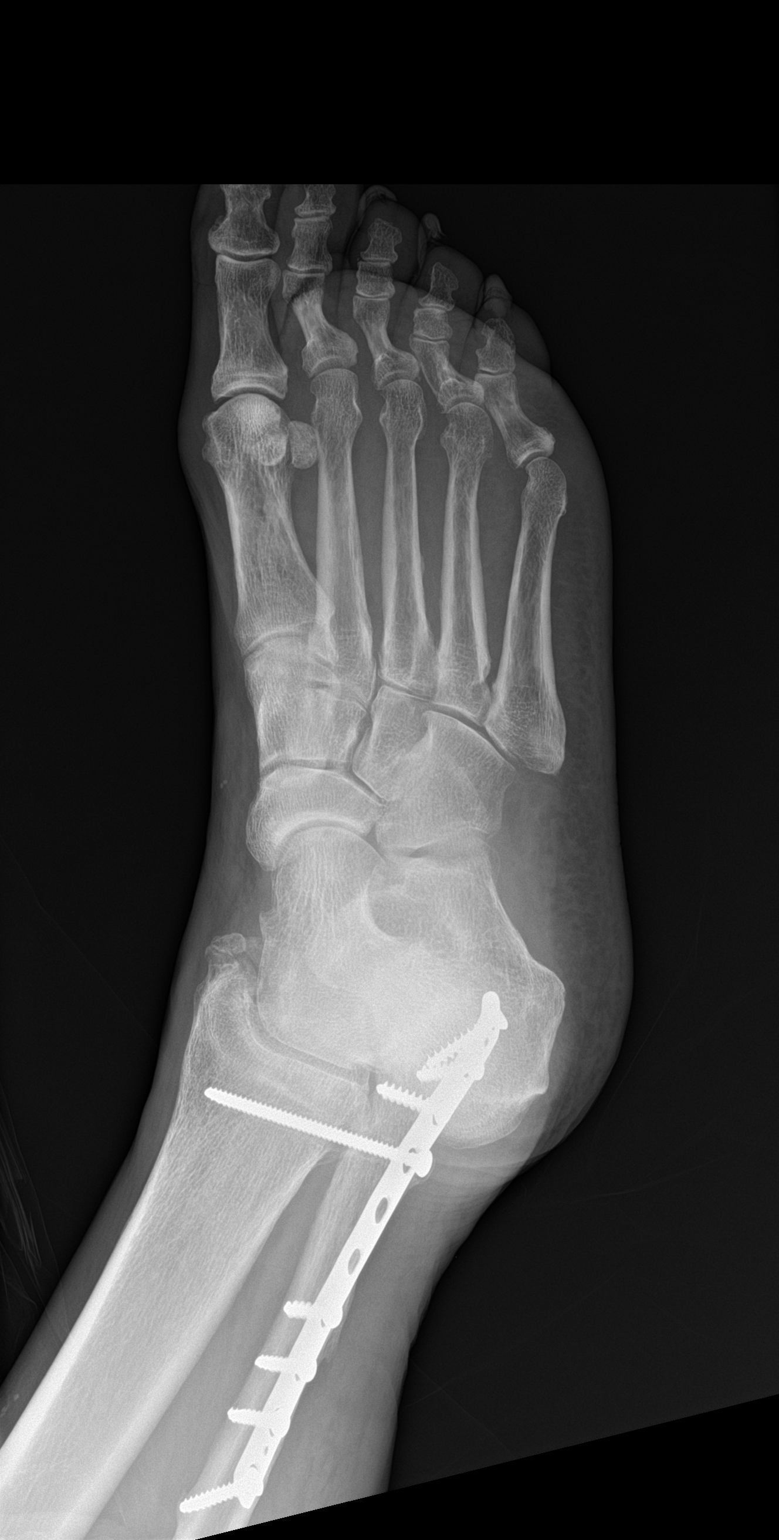

[foot lat]
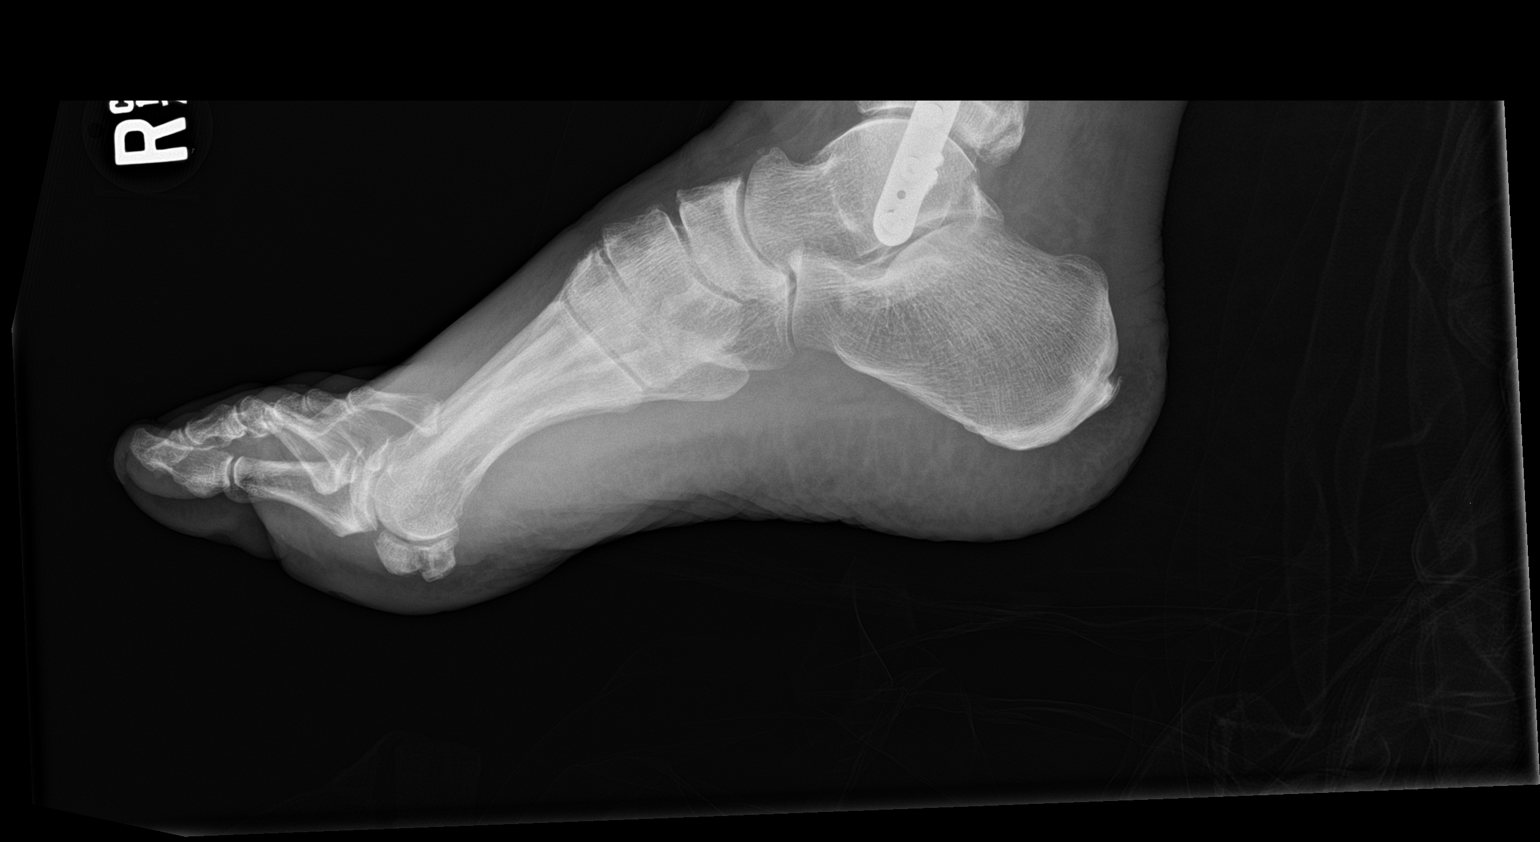

[3 of 3 positions shown; findings below may reference images not displayed]

FINDINGS: The mineralization and alignment are normal. There is no evidence of
acute fracture or dislocation. Status post distal fibular plate and
screw fixation with intact hardware. There is some irregularity of
the posterior tibia on the lateral view which does not appear acute,
although does not clearly seen on the prior radiographs. There are
mild midfoot degenerative changes, fragmented spurring of the medial
malleolus and a bipartite tibial sesamoid of the 1st metatarsal. The
soft tissues appear unremarkable.
IMPRESSION: No acute osseous findings. Mild midfoot degenerative changes and
prior distal fibular ORIF. Mild irregularity of the posterior tibia
does not appear acute.

## 2020-03-21 IMAGING — US US EXTREM LOW VENOUS*R*
1 series · 13 of 24 positions shown · non-contrast
Comparison: None.

CLINICAL DATA: Right foot pain and edema.



[Series 1: us extrem low venous*right* · 13 of 44 slices shown]
[im 1/44]
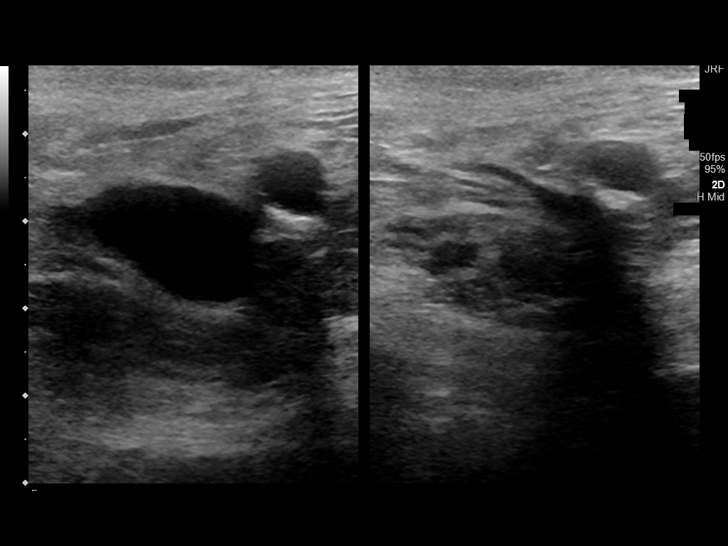
[im 4/44]
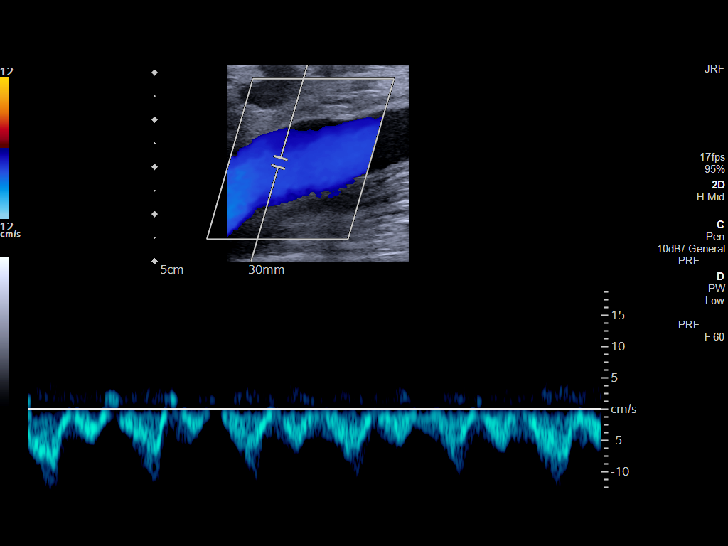
[im 8/44]
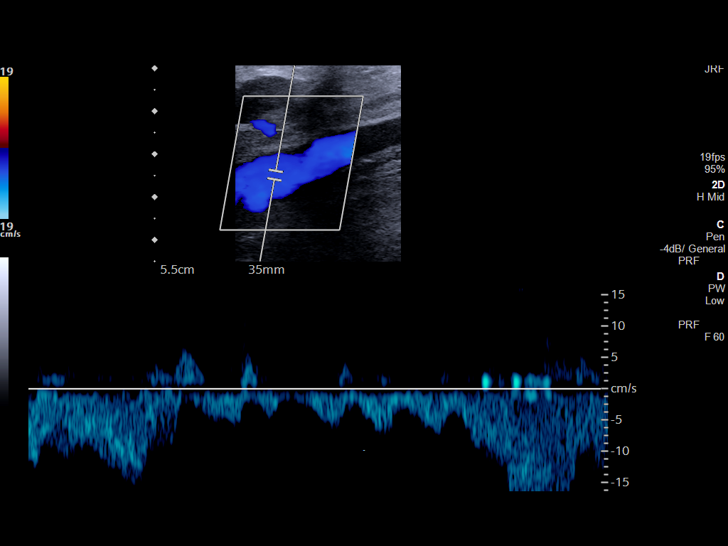
[im 12/44]
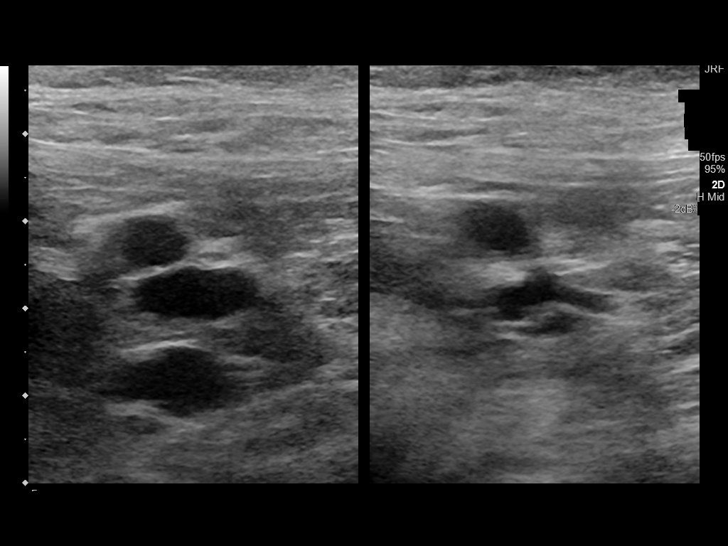
[im 15/44]
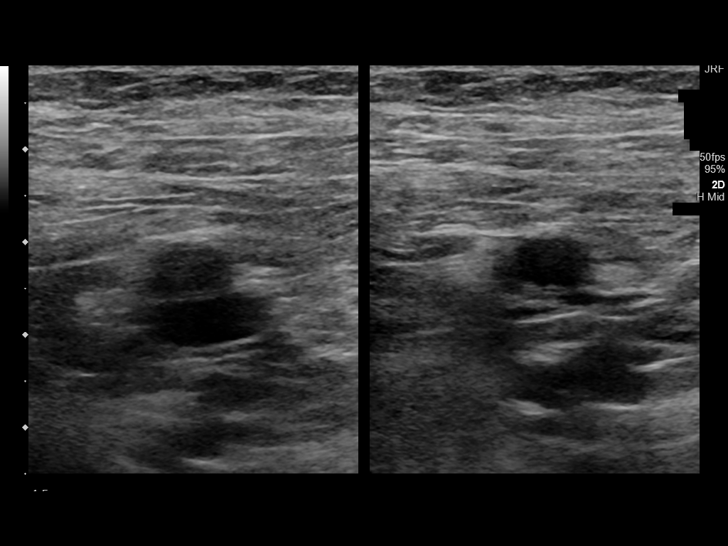
[im 19/44]
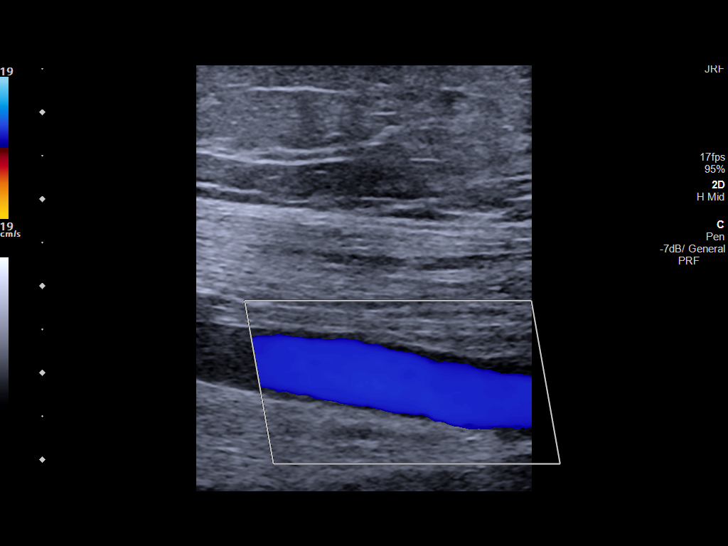
[im 23/44]
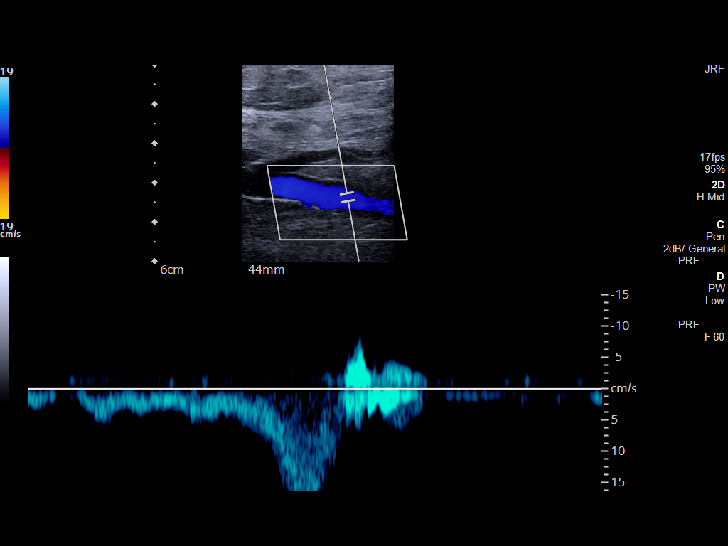
[im 25/44]
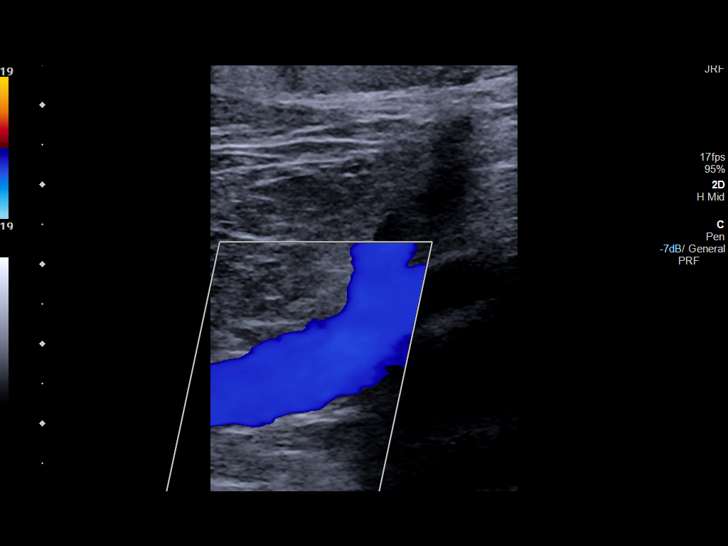
[im 29/44]
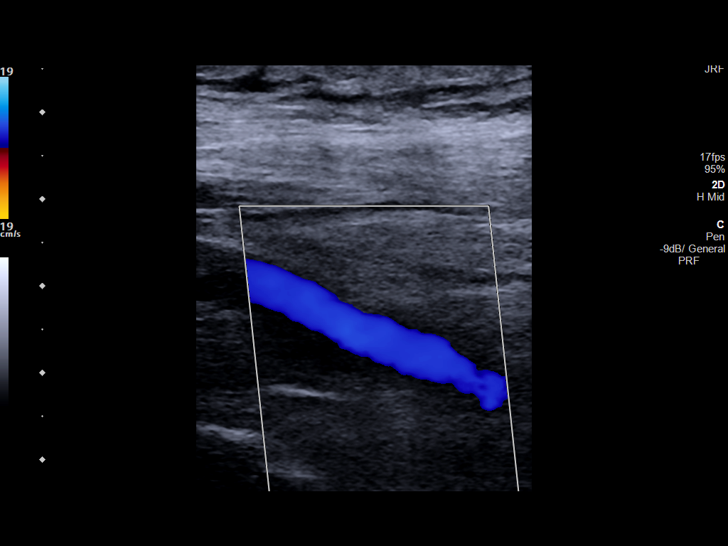
[im 32/44]
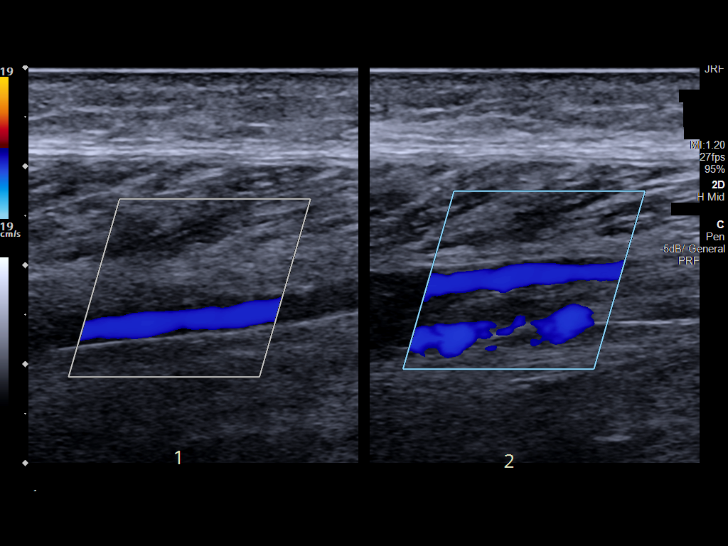
[im 36/44]
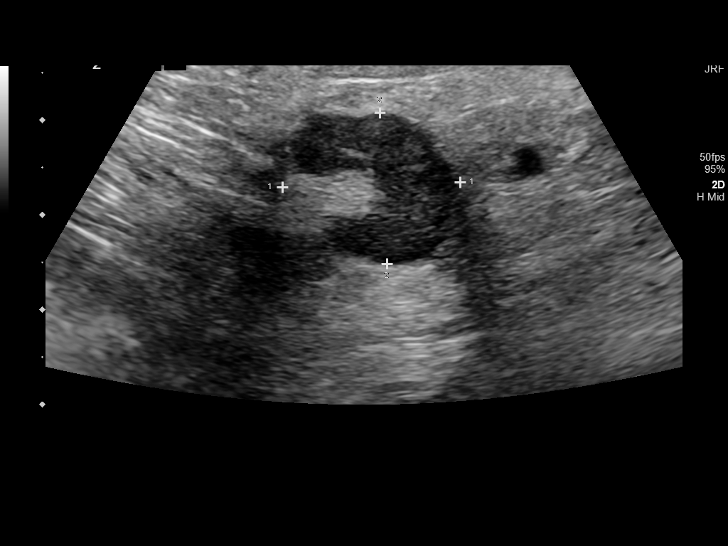
[im 40/44]
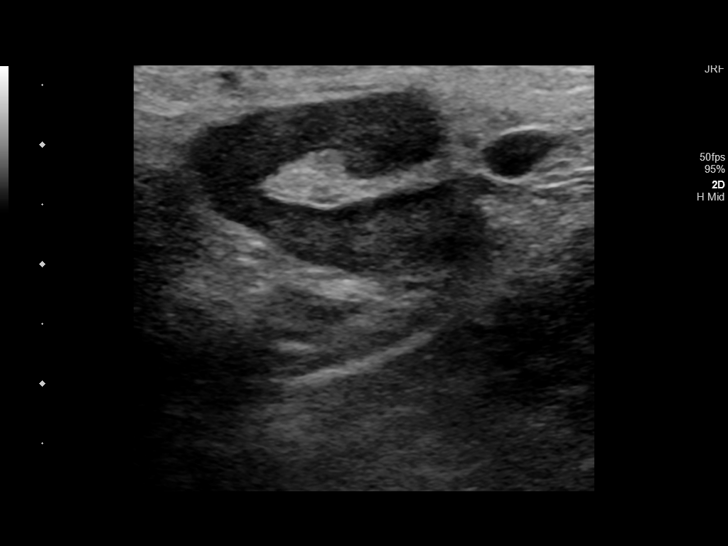
[im 44/44]
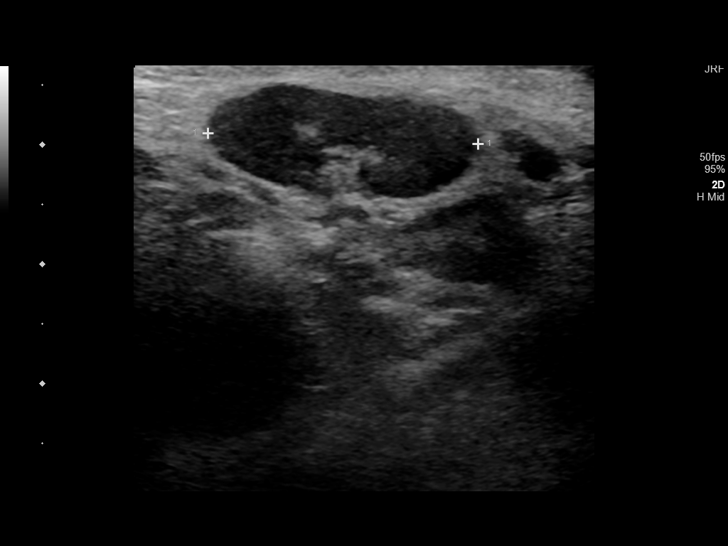

[13 of 24 positions shown; findings below may reference images not displayed]

FINDINGS: Contralateral Common Femoral Vein: Respiratory phasicity is normal
and symmetric with the symptomatic side. No evidence of thrombus.
Normal compressibility.

Common Femoral Vein: No evidence of thrombus. Normal
compressibility, respiratory phasicity and response to augmentation.

Saphenofemoral Junction: No evidence of thrombus. Normal
compressibility and flow on color Doppler imaging.

Profunda Femoral Vein: No evidence of thrombus. Normal
compressibility and flow on color Doppler imaging.

Femoral Vein: No evidence of thrombus. Normal compressibility,
respiratory phasicity and response to augmentation.

Popliteal Vein: No evidence of thrombus. Normal compressibility,
respiratory phasicity and response to augmentation.

Calf Veins: No evidence of thrombus. Normal compressibility and flow
on color Doppler imaging.

Superficial Great Saphenous Vein: No evidence of thrombus. Normal
compressibility.

Venous Reflux:  None.

Other Findings: No evidence of superficial thrombophlebitis or
abnormal fluid collection. There are some mildly prominent right
inguinal lymph nodes present with the largest measuring
approximately 1.6 cm in short axis. The visualized inguinal lymph
nodes demonstrate normal lymph node architecture.
IMPRESSION: 1. No evidence of right lower extremity deep venous thrombosis.
2. Mildly prominent right inguinal lymph nodes demonstrating normal
lymph node architecture.

## 2020-05-28 IMAGING — DX DG CHEST 1V PORT
1 series · 1 of 1 positions shown · non-contrast
Comparison: February 23, 2019.

CLINICAL DATA: Shortness of breath.

EXAM:
PORTABLE CHEST 1 VIEW

[chest ap]
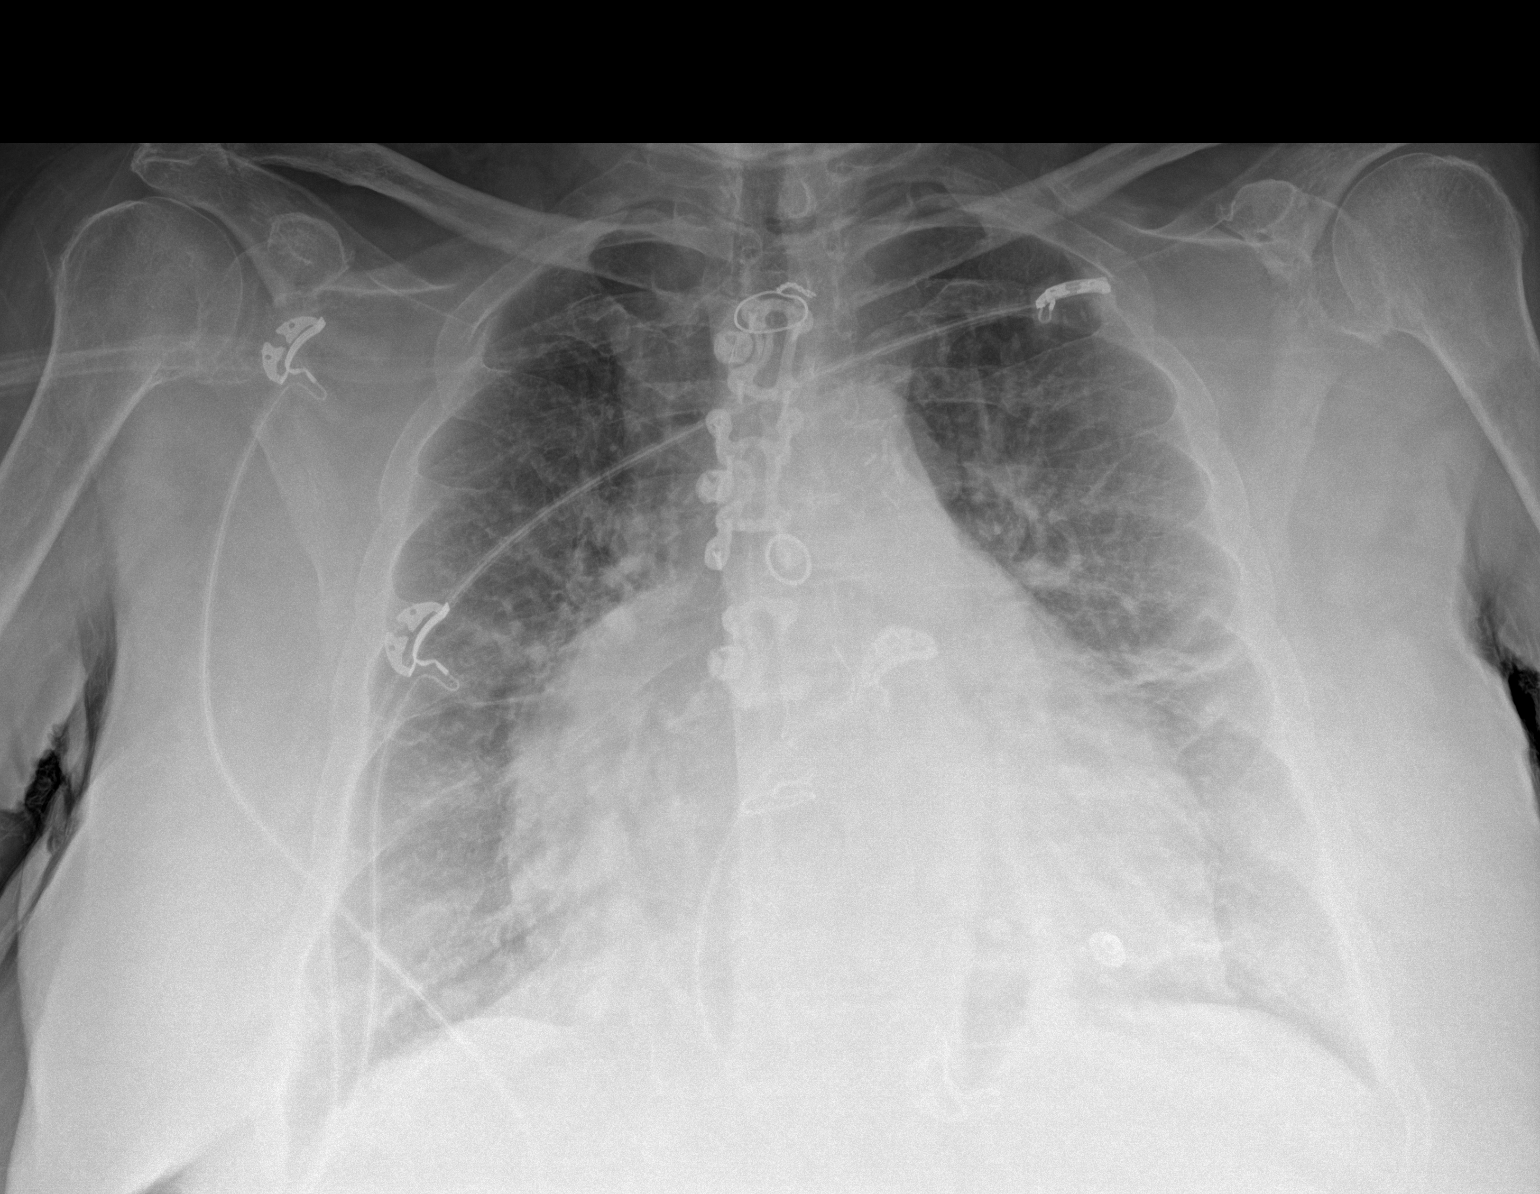

[1 of 1 positions shown; findings below may reference images not displayed]

FINDINGS: Stable cardiomegaly. Status post coronary artery bypass graft. No
pneumothorax or pleural effusion is noted. Mild central pulmonary
vascular congestion is noted. Left midlung subsegmental atelectasis
is noted. Bony thorax is unremarkable.
IMPRESSION: Stable cardiomegaly with mild central pulmonary vascular congestion.
Left midlung subsegmental atelectasis is noted.

## 2020-05-28 IMAGING — US US ABDOMEN LIMITED
1 series · 9 of 9 positions shown · non-contrast
Comparison: None.

CLINICAL DATA: Abdominal distension.

EXAM:
LIMITED ABDOMEN ULTRASOUND FOR ASCITES
TECHNIQUE: Limited ultrasound survey for ascites was performed in all four
abdominal quadrants.

[Series 1: us ascites (abdomen limited) · 9 of 9 slices shown]
[im 1/9]
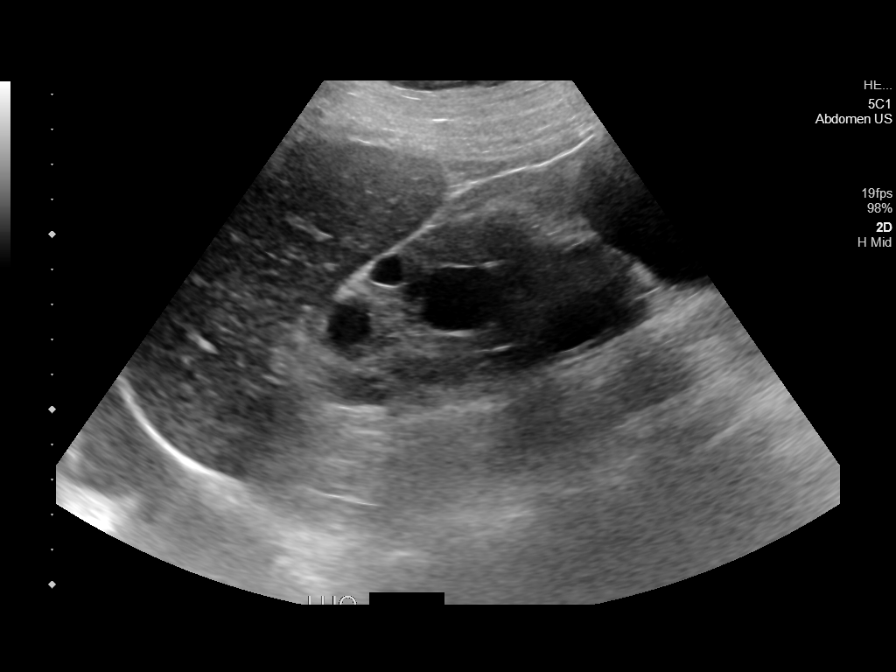
[im 2/9]
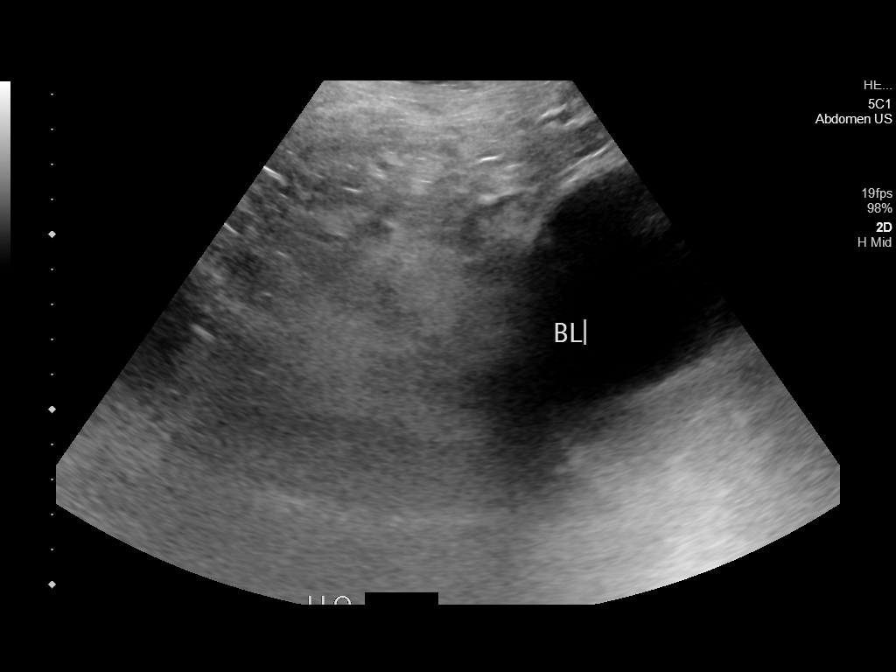
[im 3/9]
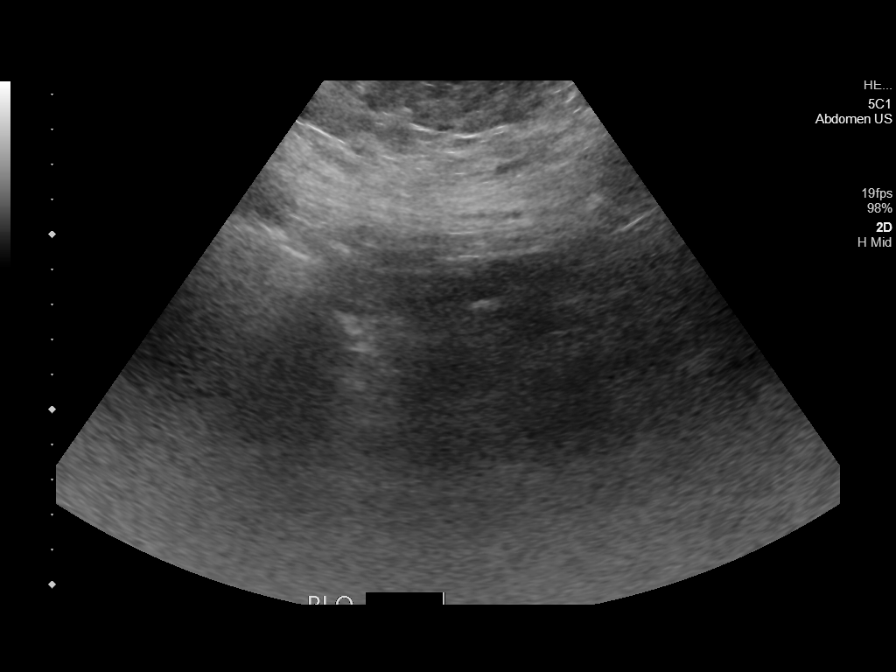
[im 4/9]
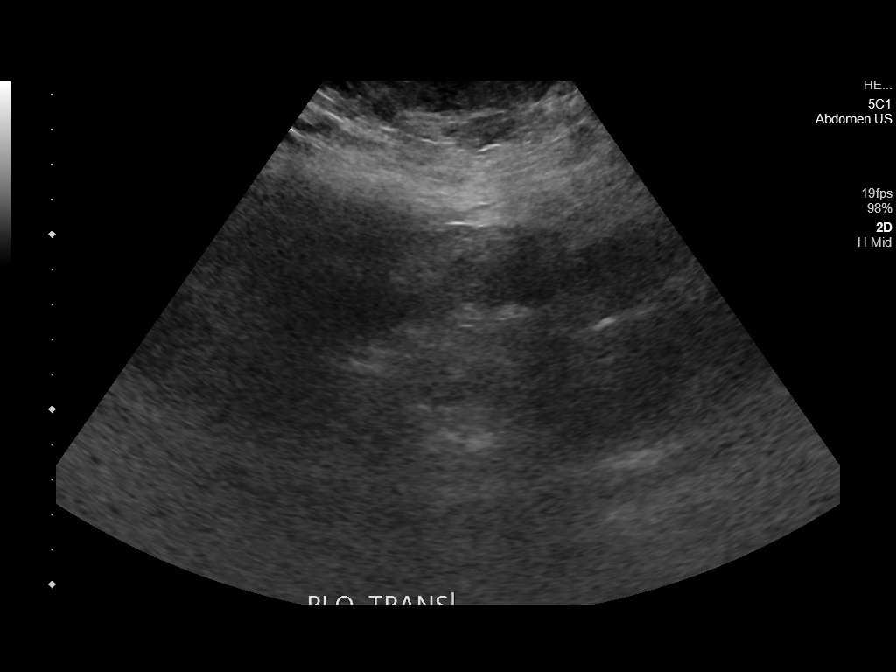
[im 5/9]
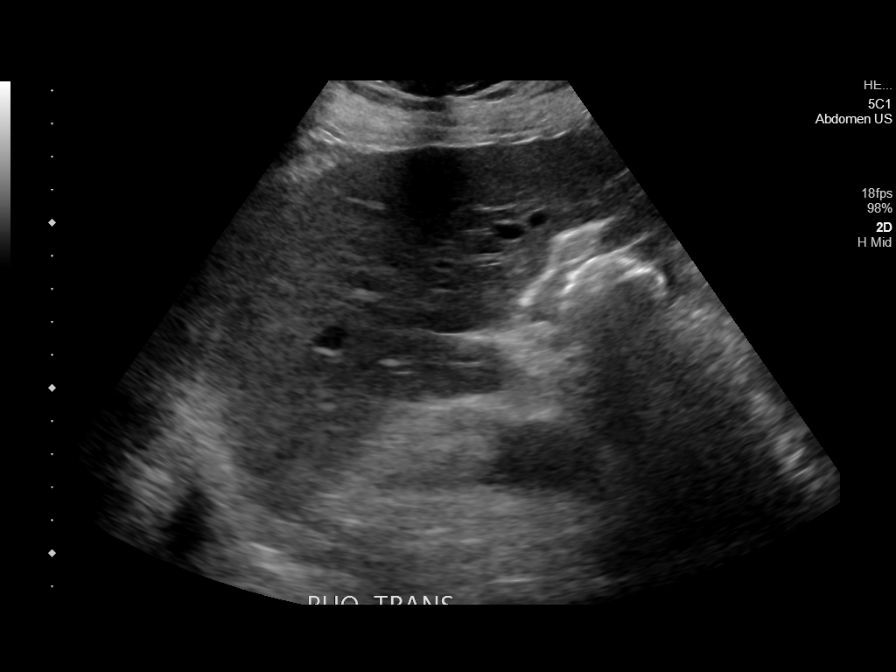
[im 6/9]
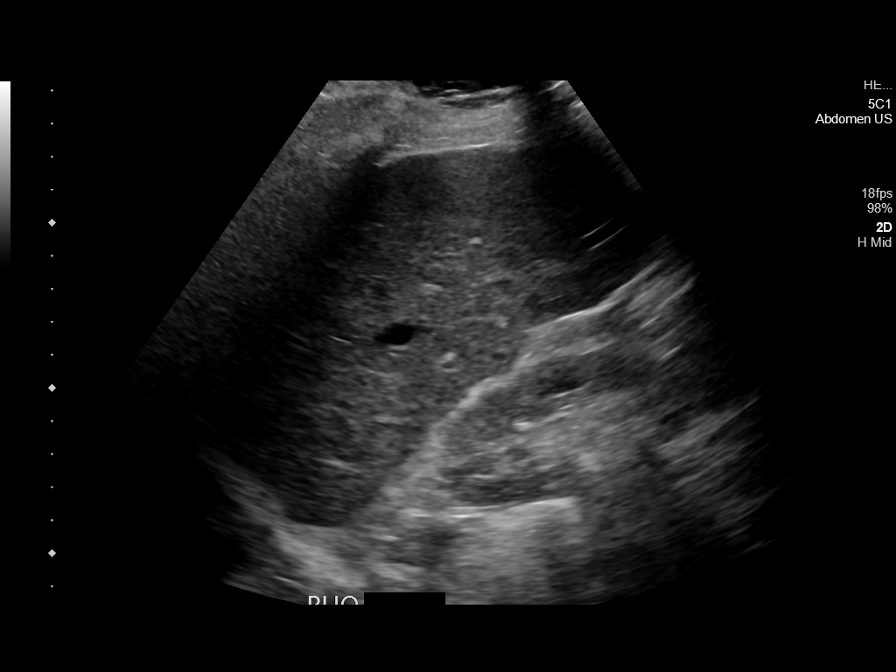
[im 7/9]
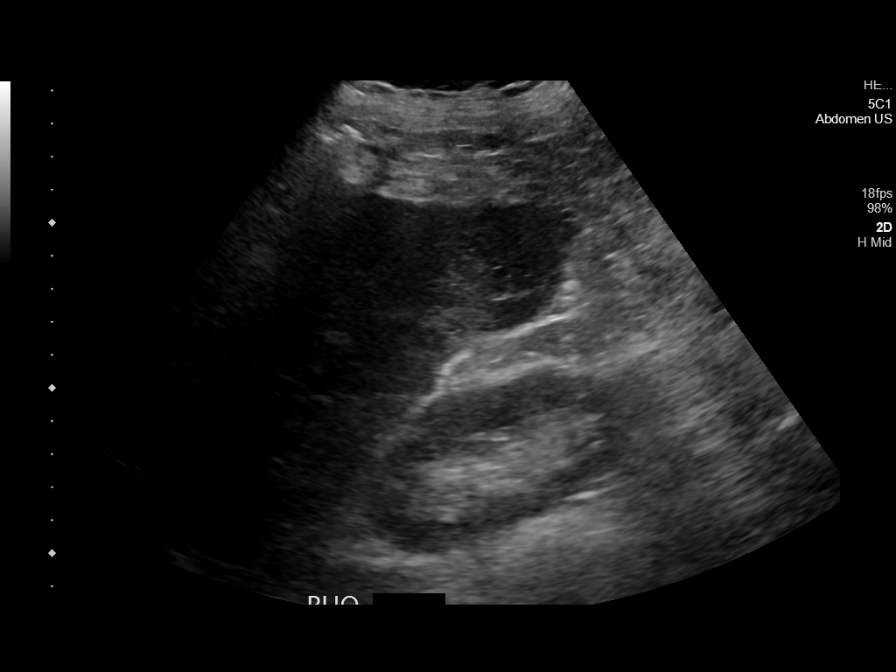
[im 8/9]
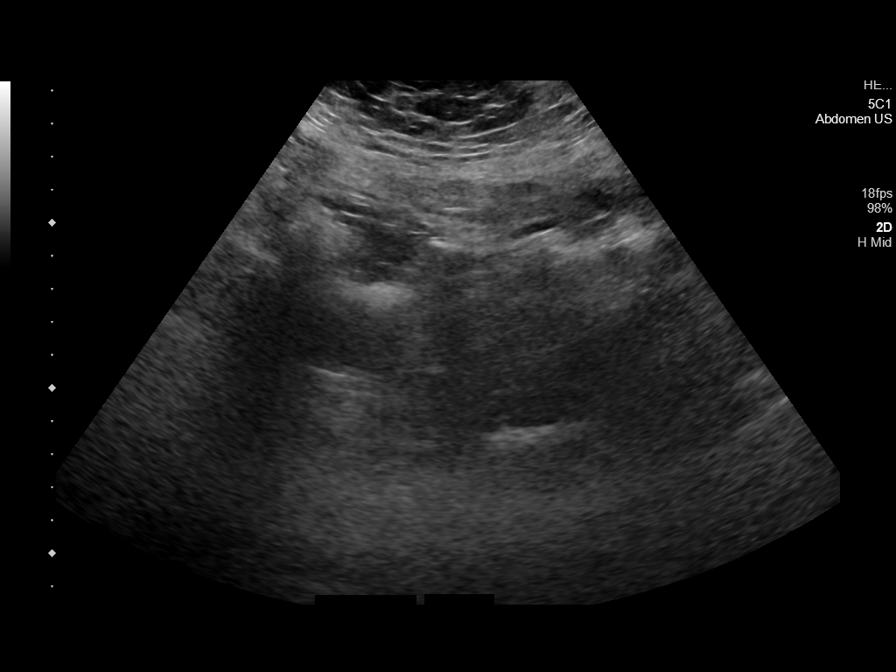
[im 9/9]
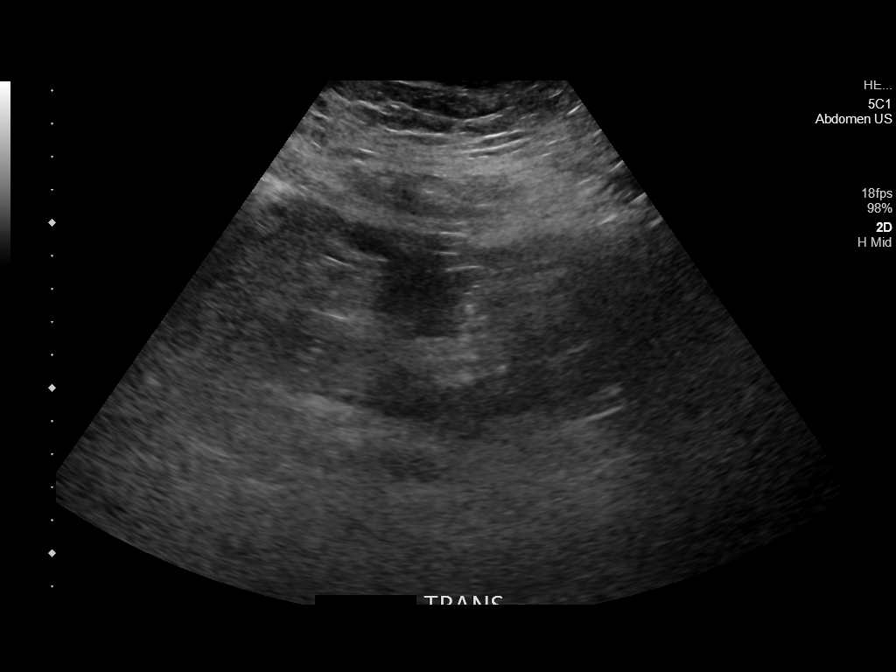

[9 of 9 positions shown; findings below may reference images not displayed]

FINDINGS: No definite ascites is noted in any quadrant of the abdomen.
IMPRESSION: No ascites is noted.

## 2020-05-29 IMAGING — US US EXTREM LOW VENOUS
1 series · 13 of 24 positions shown · non-contrast
Comparison: Ultrasounds 02/25/2019, 08/07/2018

CLINICAL DATA: Bilateral leg pain and weeping edema of the left
lower extremity.

EXAM:
BILATERAL LOWER EXTREMITY VENOUS DOPPLER ULTRASOUND
TECHNIQUE: Gray-scale sonography with compression, as well as color and duplex
ultrasound, were performed to evaluate the deep venous system(s)
from the level of the common femoral vein through the popliteal and
proximal calf veins.

[Series 1: us venous img lower bilat (dvt) · portal-venous · 13 of 66 slices shown]
[im 1/66]
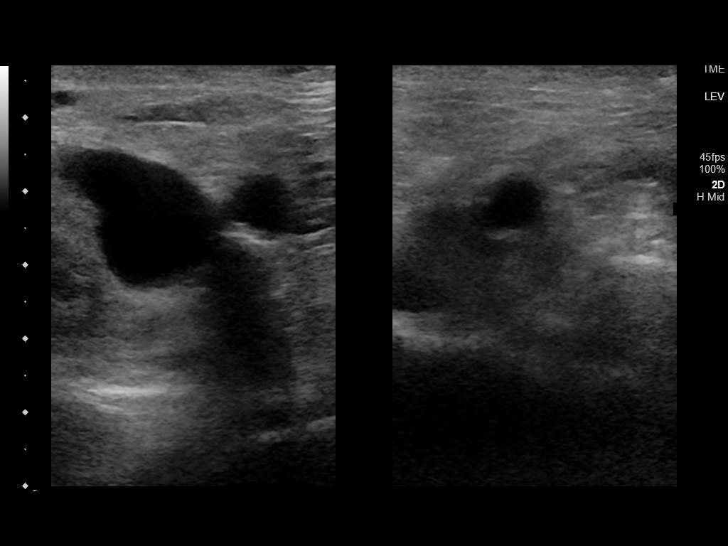
[im 6/66]
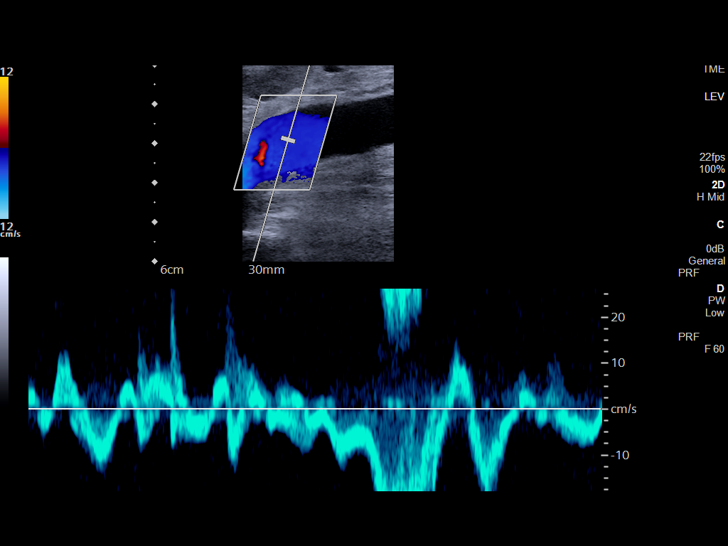
[im 12/66]
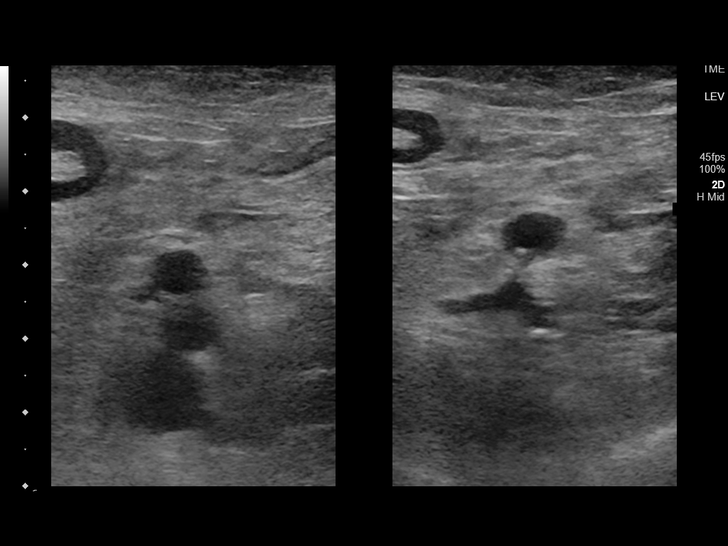
[im 17/66]
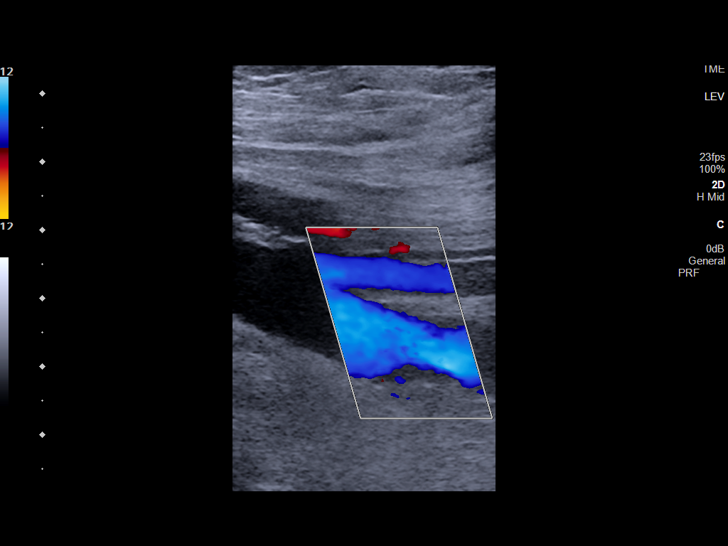
[im 23/66]
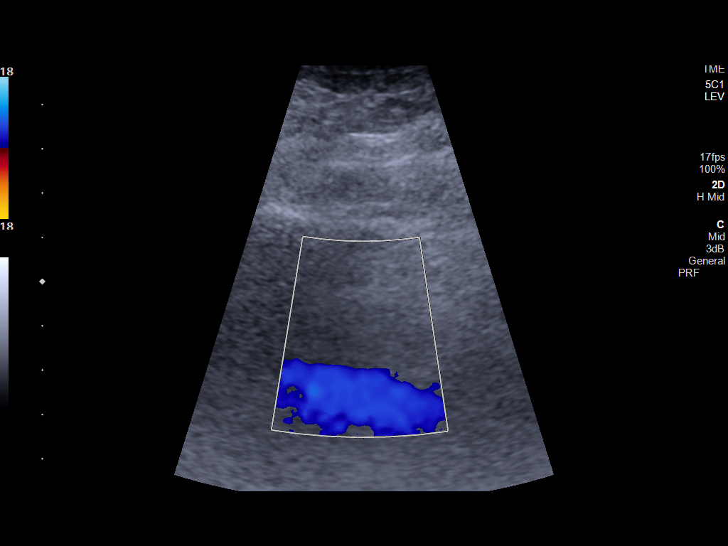
[im 29/66]
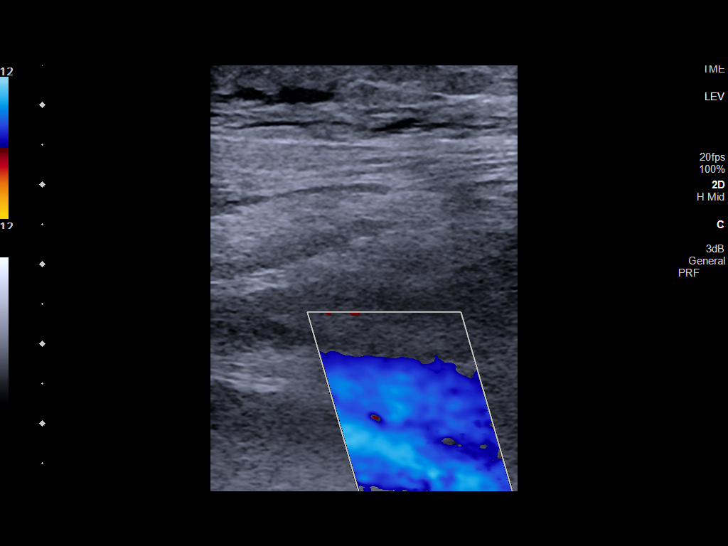
[im 34/66]
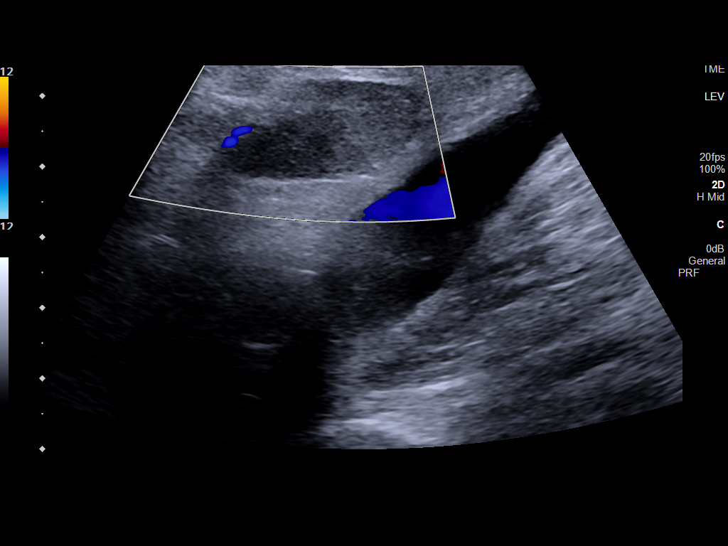
[im 37/66]
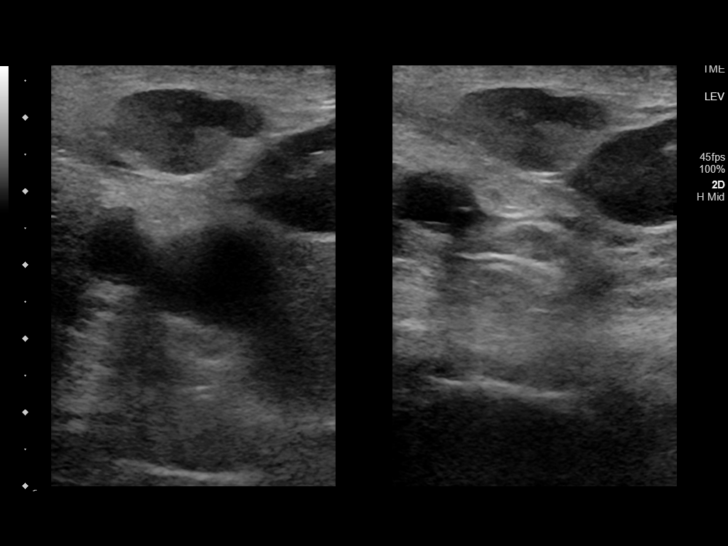
[im 43/66]
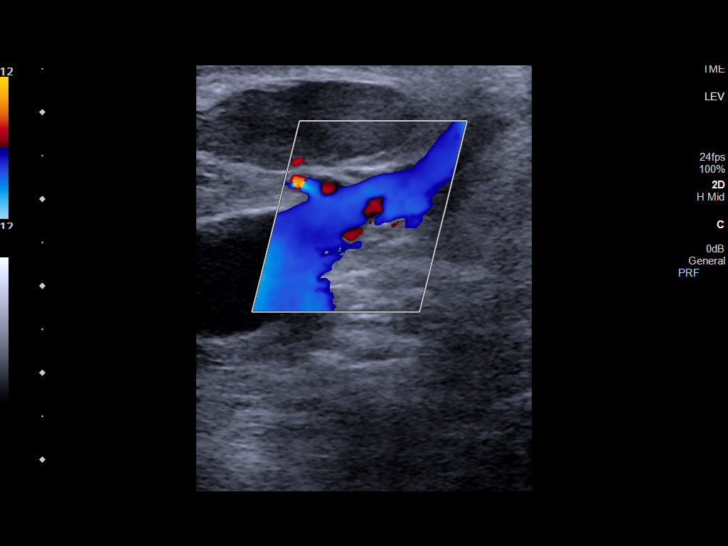
[im 49/66]
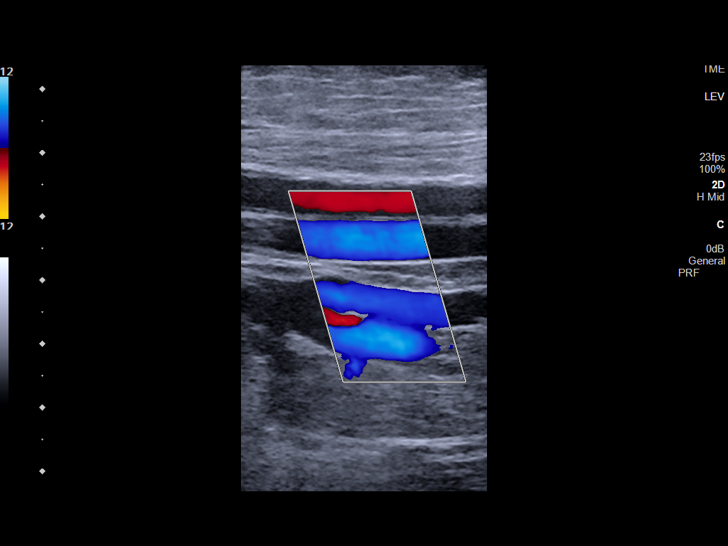
[im 54/66]
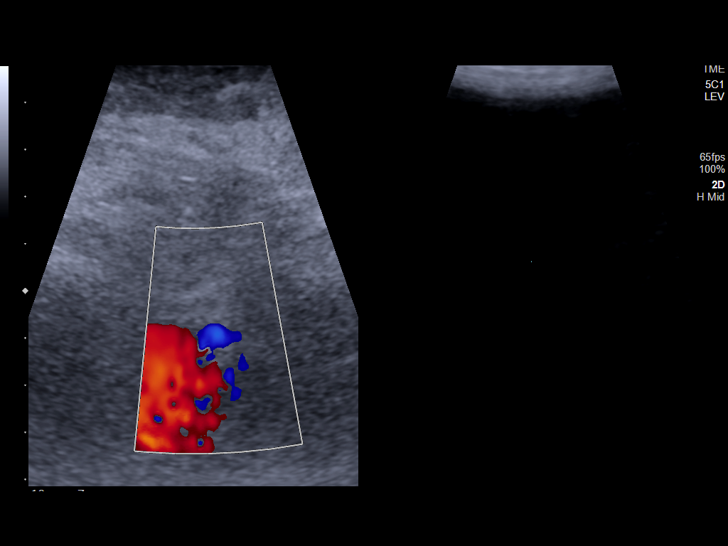
[im 60/66]
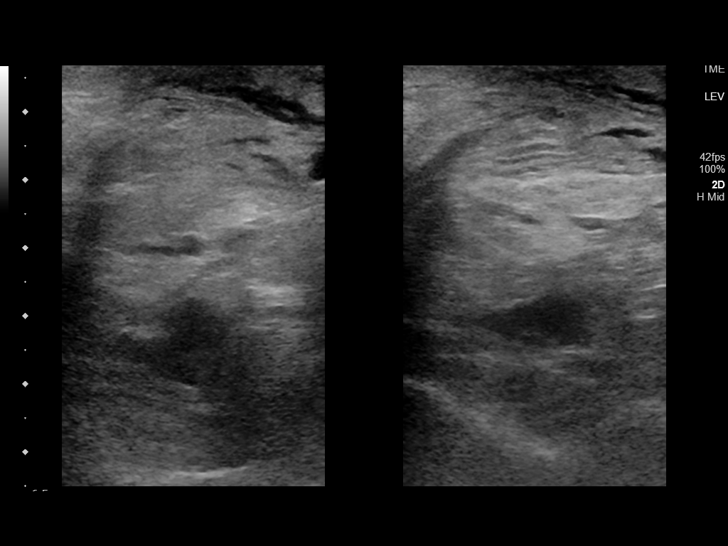
[im 66/66]
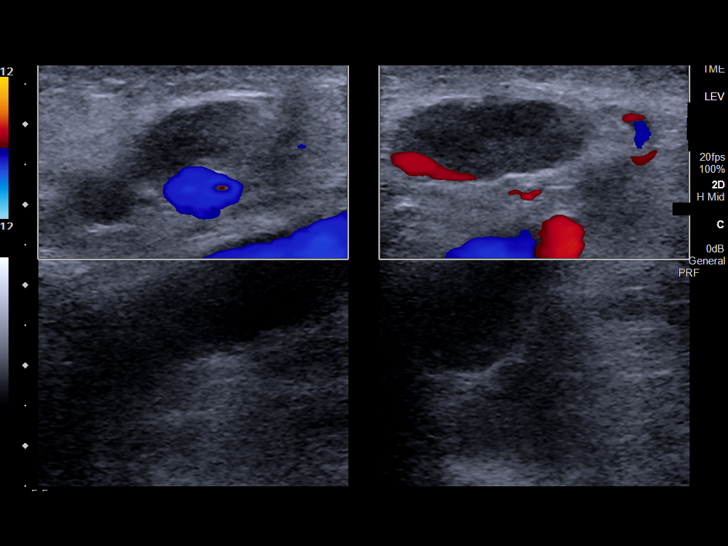

[13 of 24 positions shown; findings below may reference images not displayed]

FINDINGS: VENOUS

Normal color Doppler flow is seen within the femoral, superficial
femoral, and popliteal veins, as well as the visualized calf veins.
Visualized portions of profunda femoral vein and great saphenous
vein unremarkable. No filling defects to suggest DVT on grayscale or
color Doppler imaging. Doppler waveforms show normal direction of
venous flow, normal respiratory phasicity and response to
augmentation.

OTHER

Marked lower extremity edema. Several prominent inguinal nodes with
preserved nodal architecture likely edematous and or reactive.

Limitations: Limited examination of the lower extremities due to
patient body habitus, extreme soft tissue edema in patient's
intolerance of compression technique.
IMPRESSION: Sonographic evaluation for deep venous thrombosis is limited by
patient's body habitus, edema and inability to tolerate compression.
Normal color flow is seen within the deep venous system of both
lower extremities however small noncompressible thrombus cannot be
fully excluded.

If clinical symptoms are inconsistent or if there are persistent or
worsening symptoms, further imaging (possibly involving the iliac
veins) may be warranted.

## 2020-06-19 IMAGING — DX DG CHEST 1V PORT
1 series · 1 of 1 positions shown · non-contrast
Comparison: 05/04/2019

CLINICAL DATA: Pain

EXAM:
PORTABLE CHEST 1 VIEW

[chest ap]
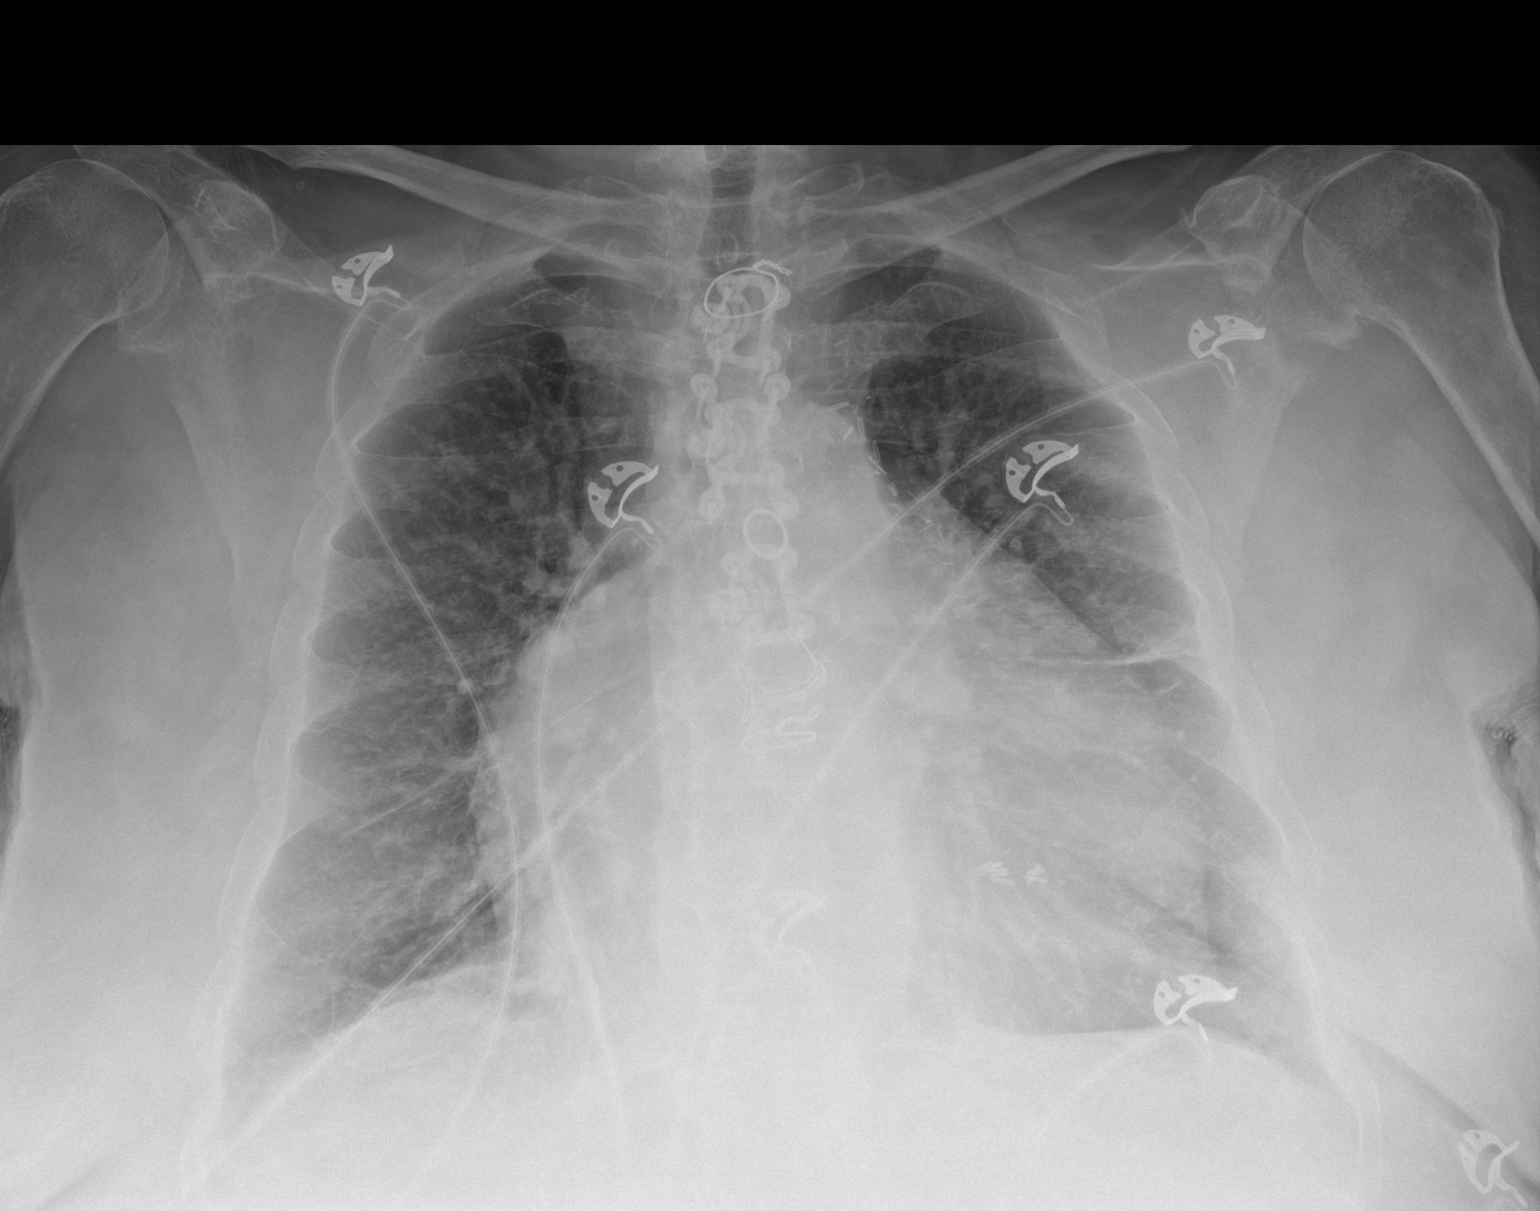

[1 of 1 positions shown; findings below may reference images not displayed]

FINDINGS: There is significant cardiomegaly. The appearance of the cardiac
silhouette is unchanged since prior study. Areas of scarring
atelectasis are again noted. The patient is status post prior median
sternotomy with valve replacement. There is vascular congestion with
mild interstitial pulmonary edema. There is no acute osseous
abnormality.
IMPRESSION: Cardiomegaly with mild interstitial pulmonary edema.

## 2020-06-19 IMAGING — US US EXTREM LOW VENOUS
1 series · 13 of 24 positions shown · non-contrast
Comparison: None.

CLINICAL DATA: Lower extremity pain and swelling.

EXAM:
BILATERAL LOWER EXTREMITY VENOUS DOPPLER ULTRASOUND
TECHNIQUE: Gray-scale sonography with compression, as well as color and duplex
ultrasound, were performed to evaluate the deep venous system(s)
from the level of the common femoral vein through the popliteal and
proximal calf veins.

[Series 1: us venous img lower bilat (dvt) · portal-venous · 13 of 61 slices shown]
[im 1/61]
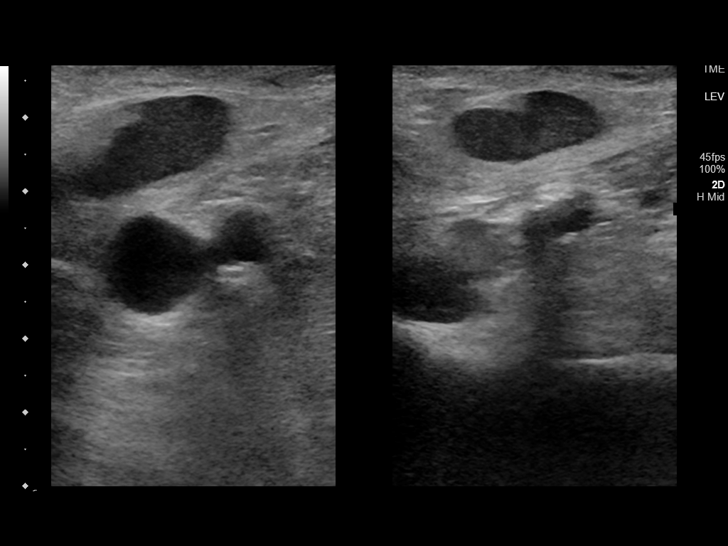
[im 6/61]
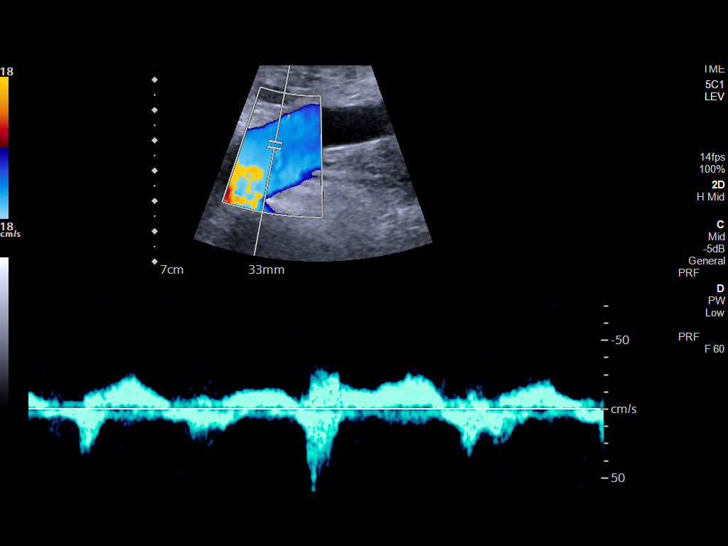
[im 11/61]
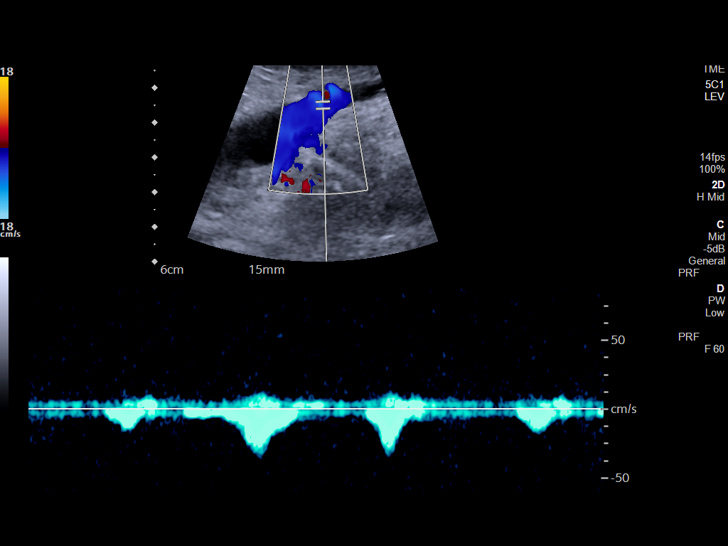
[im 16/61]
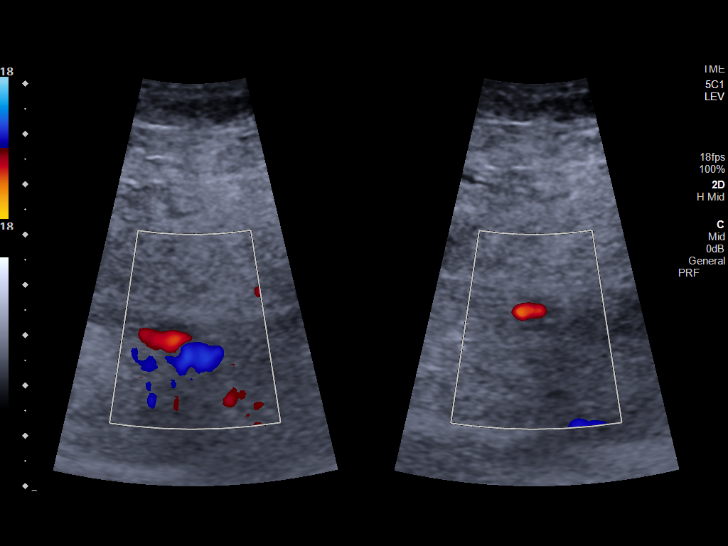
[im 21/61]
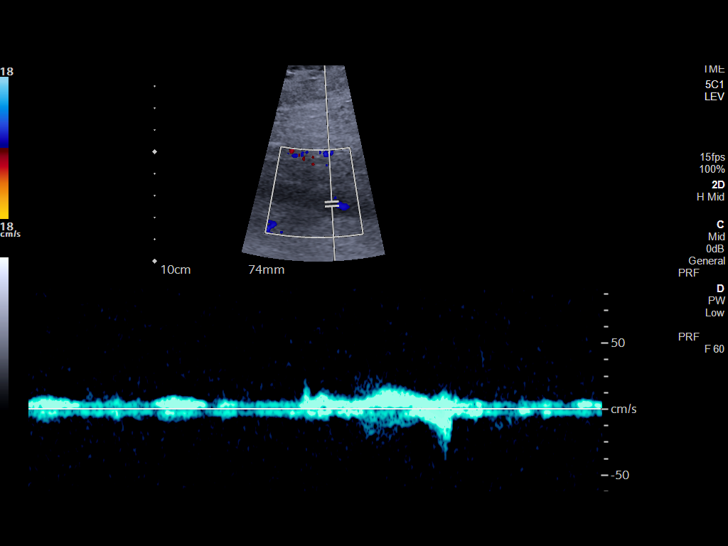
[im 27/61]
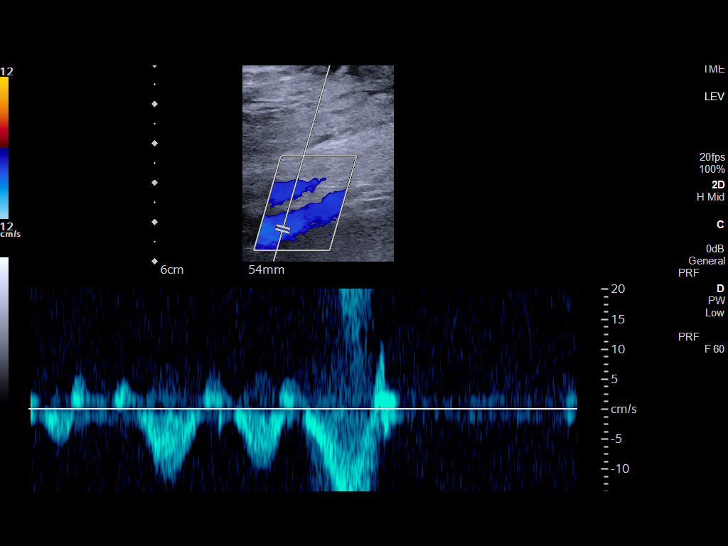
[im 32/61]
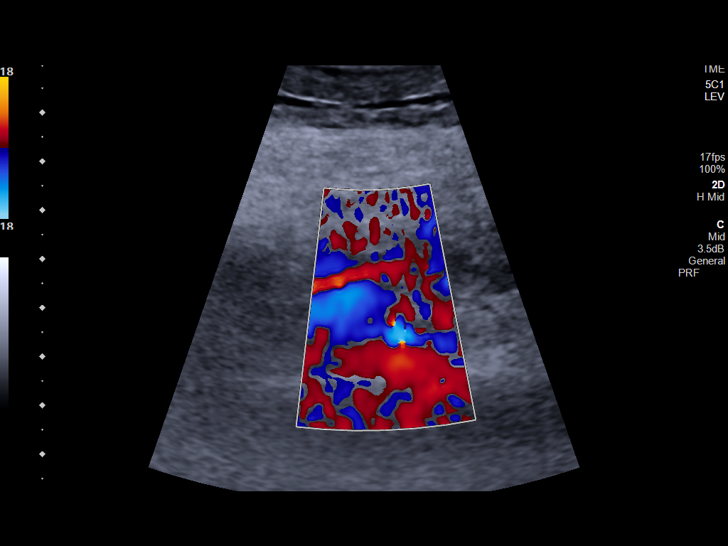
[im 34/61]
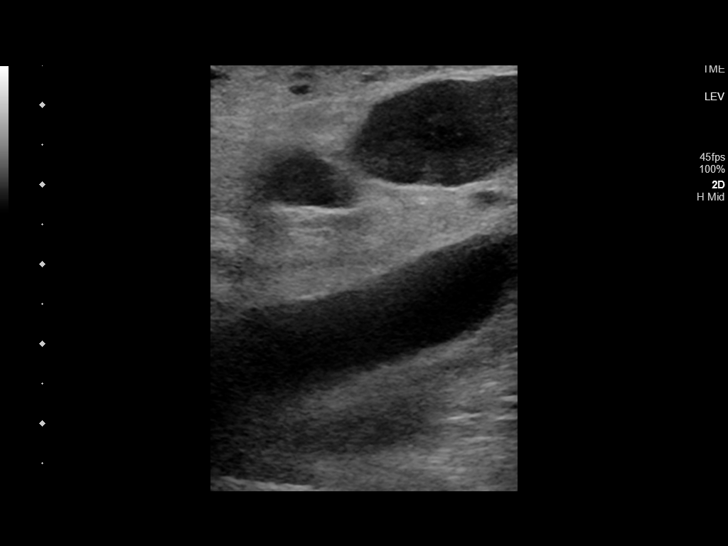
[im 40/61]
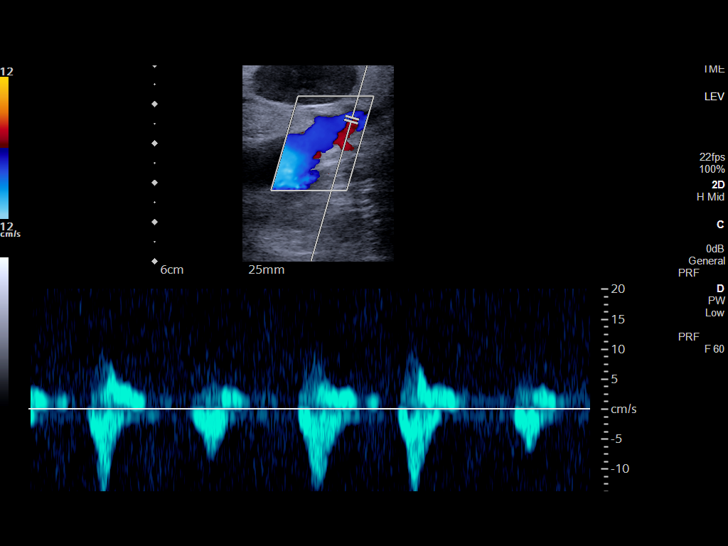
[im 45/61]
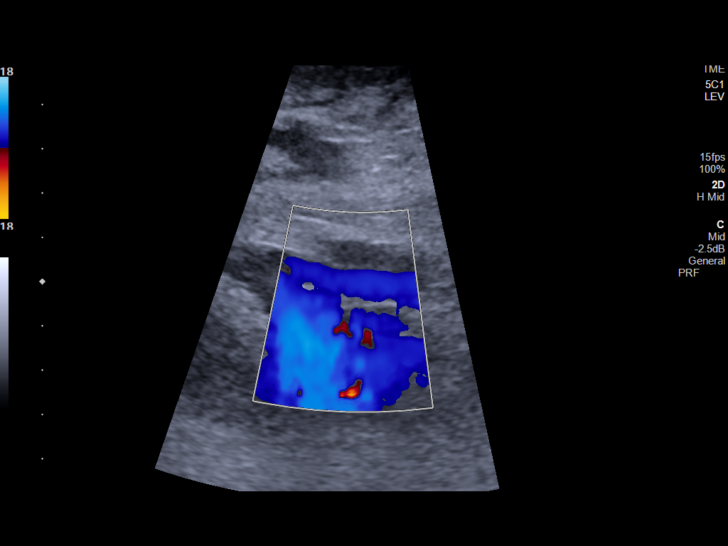
[im 50/61]
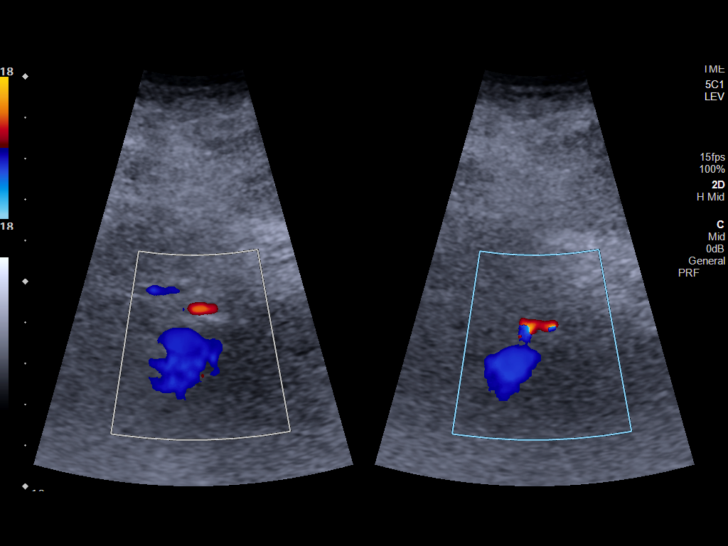
[im 55/61]
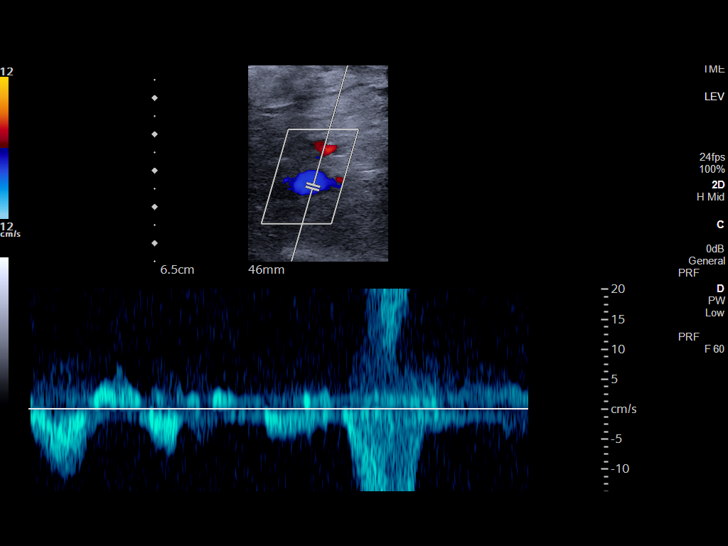
[im 61/61]
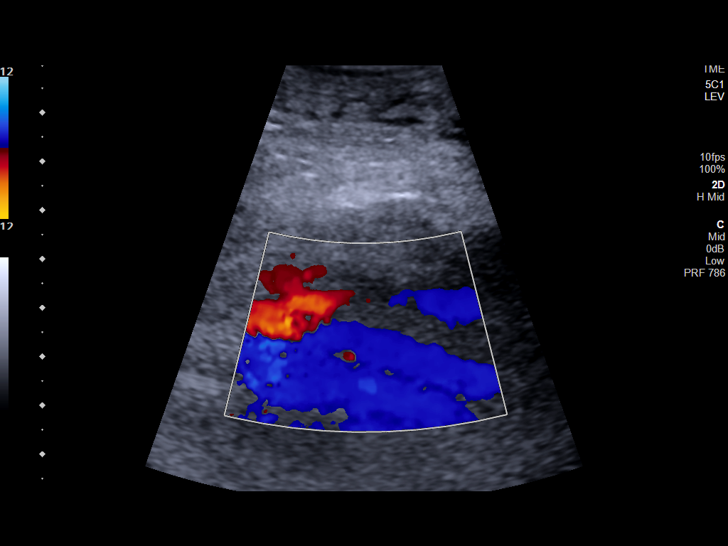

[13 of 24 positions shown; findings below may reference images not displayed]

FINDINGS: VENOUS

Evaluation of the patient's lower extremities was severely limited
by patient habitus and diffuse anasarca. Flow is visualized within
all of the identified venous structures, however adequate
compressibility could not be assessed.

OTHER

Extensive bilateral lower extremity edema was noted. There are
enlarged inguinal lymph nodes bilaterally. Several of the lymph
nodes bilaterally appear to demonstrate diffusely thickened
cortices. These were seen on the patient's prior CT of the pelvis
from 10/26/2018.

Limitations: Patient body habitus
IMPRESSION: 1. Severely limited study secondary to patient body habitus and
diffuse edema. No occlusive DVT was identified, however a
nonocclusive DVT cannot be excluded in either lower extremity.
2. Enlarged bilateral inguinal lymph nodes of unknown clinical
significance. These may be reactive, however malignant lymph nodes
are not excluded.

## 2020-06-19 IMAGING — DX DG FOOT 2V*R*
2 series · 2 of 2 positions shown · non-contrast
Comparison: February 2009

CLINICAL DATA: Status post fall. Fatigue.

EXAM:
RIGHT FOOT - 2 VIEW

[foot ap]
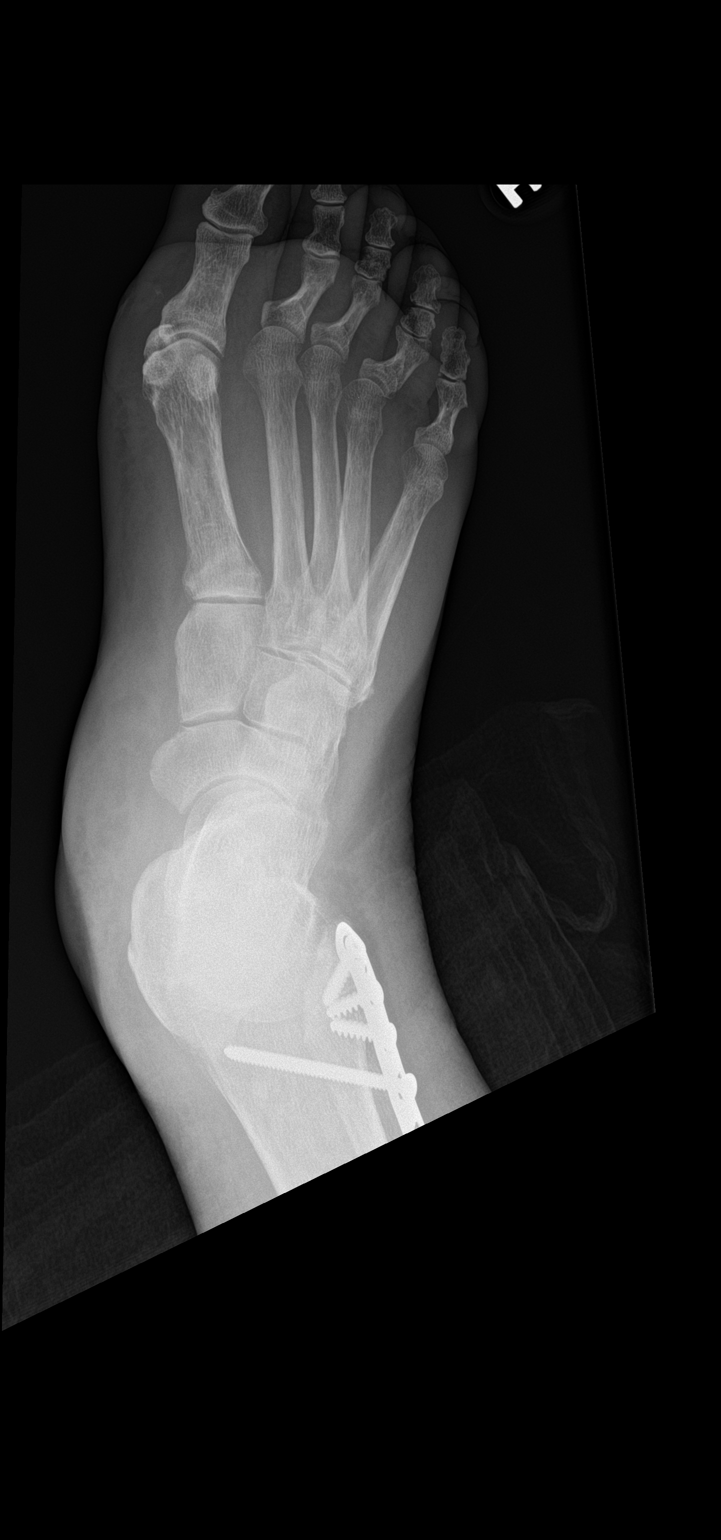

[foot lat]
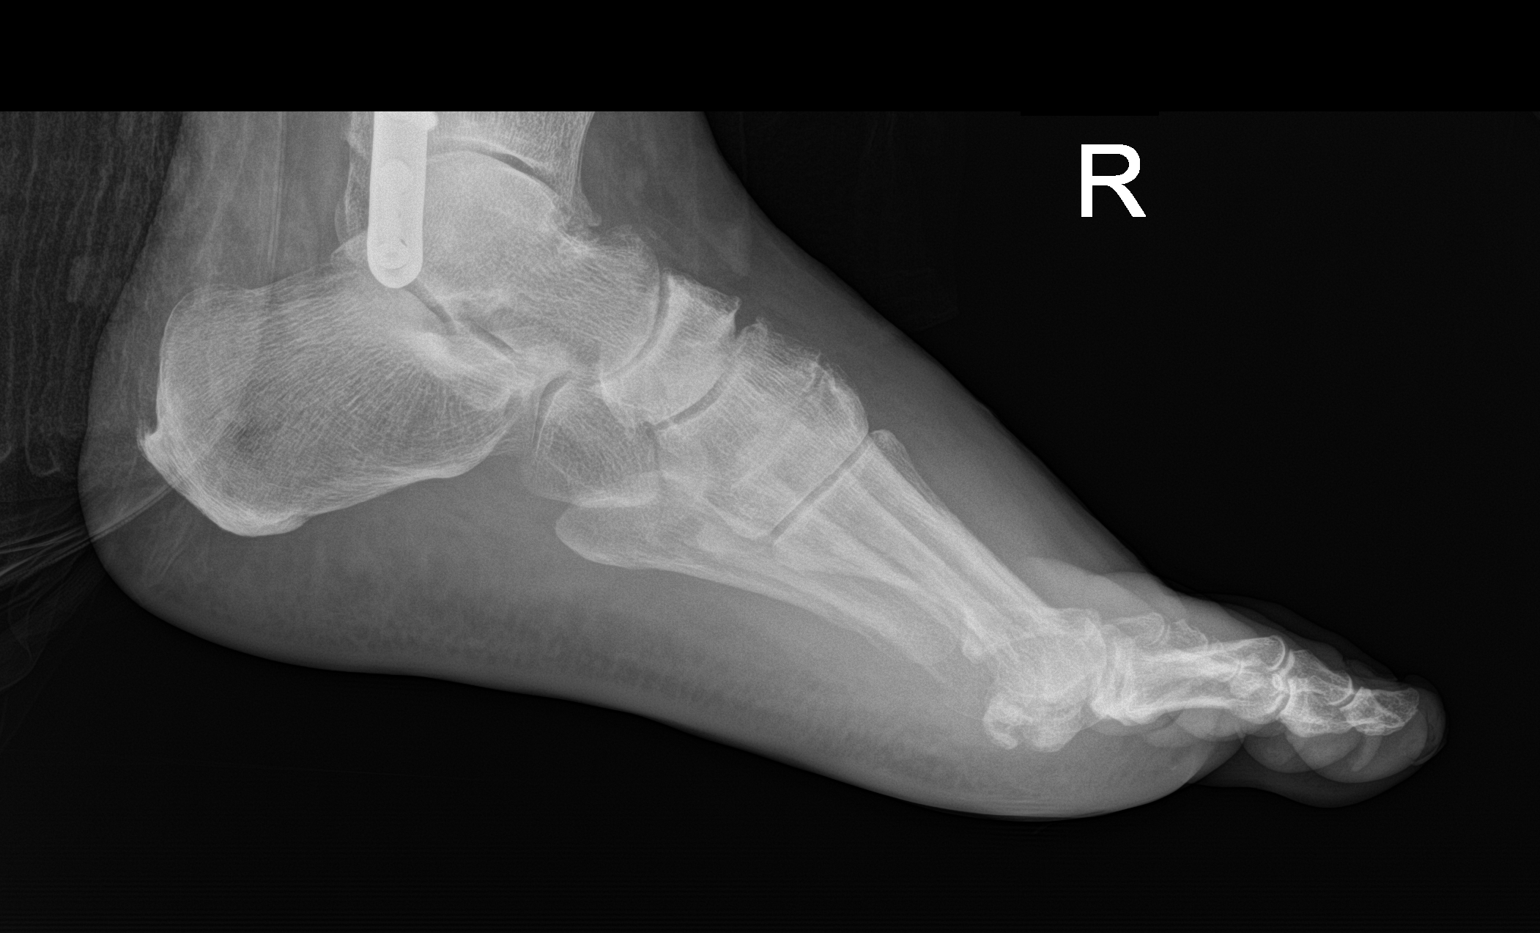

[2 of 2 positions shown; findings below may reference images not displayed]

FINDINGS: There is no evidence of fracture or dislocation. There is no
evidence of arthropathy or other focal bone abnormality. Diffuse
soft tissue swelling. Partially visualize fibular fracture fixation.
IMPRESSION: 1. No acute fracture or dislocation identified about the right foot.
2. Diffuse soft tissue swelling.

## 2020-06-25 IMAGING — MR MR ANKLE*R* WO/W CM
8 series · 40 of 40 positions shown · IV contrast (10ml Gadavist)
Comparison: None.

CLINICAL DATA: Foot swelling diabetic

EXAM:
MRI OF THE RIGHT ANKLE WITHOUT AND WITH CONTRAST
TECHNIQUE: Multiplanar, multisequence MR imaging of the was performed following
the administration of intravenous contrast.
CONTRAST:  10mL GADAVIST GADOBUTROL 1 MMOL/ML IV SOLN

[Series 14: PD · axial · right · 3.0mm · 0.50mm/px · z∈[-87,+29]mm · 5 of 36 slices shown]
[im 1/36]
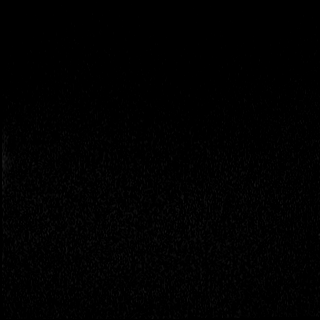
[im 9/36]
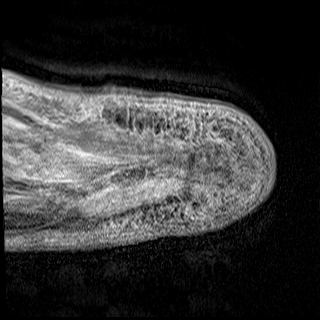
[im 18/36]
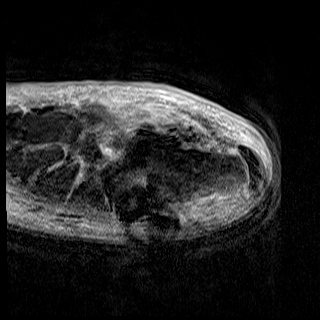
[im 27/36]
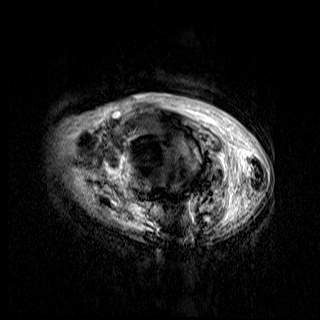
[im 36/36]
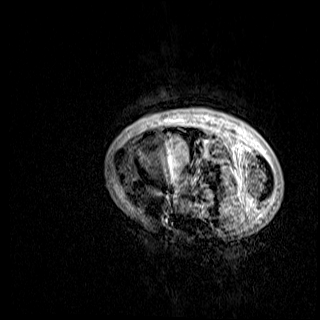

[Series 16: T2 fat-sat · axial · right · 3.0mm · 0.50mm/px · z∈[-87,+29]mm · 5 of 36 slices shown (1 of 2)]
[im 1/36]
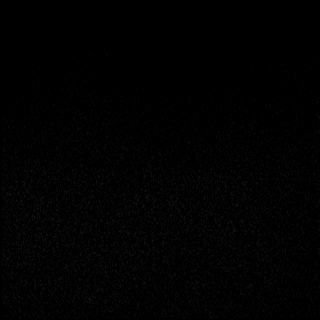
[im 9/36]
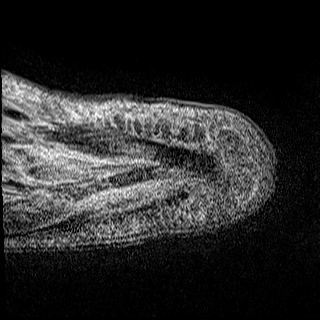
[im 18/36]
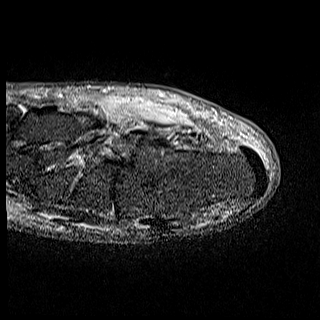
[im 27/36]
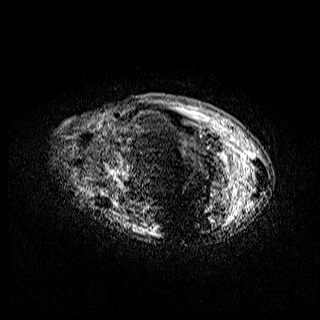
[im 36/36]
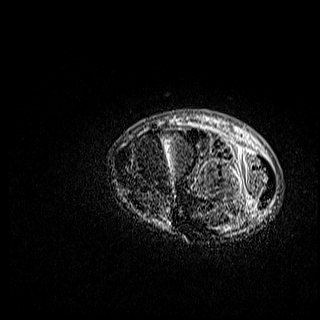

[Series 17: T1 fat-sat · axial · right · 3.0mm · 0.50mm/px · z∈[-87,+29]mm · 5 of 36 slices shown (1 of 3)]
[im 1/36]
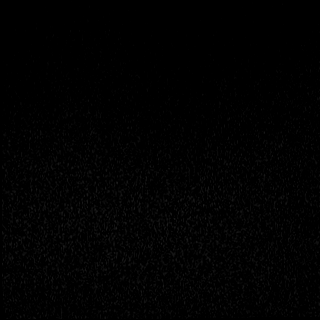
[im 9/36]
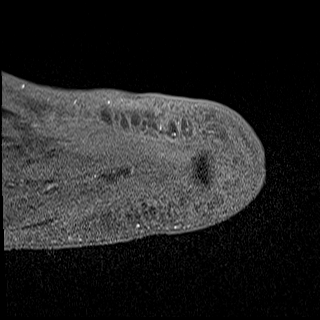
[im 18/36]
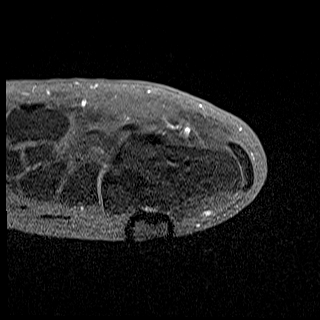
[im 27/36]
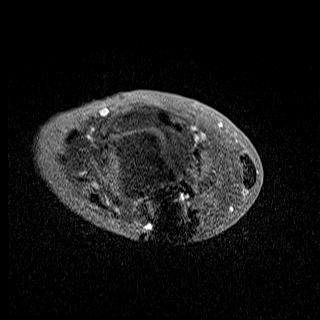
[im 36/36]
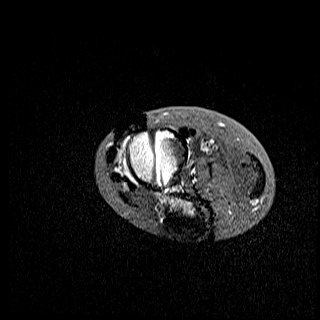

[Series 19: T2 fat-sat · sagittal · right · 3.0mm · 0.62mm/px · 6 of 42 slices shown (2 of 2)]
[im 1/42]
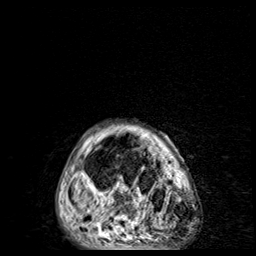
[im 9/42]
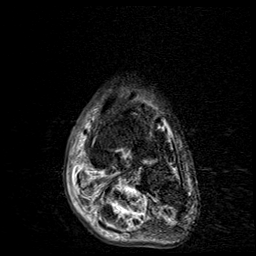
[im 17/42]
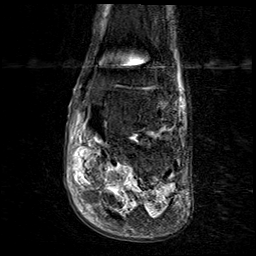
[im 25/42]
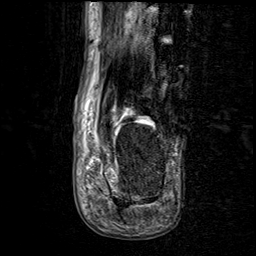
[im 33/42]
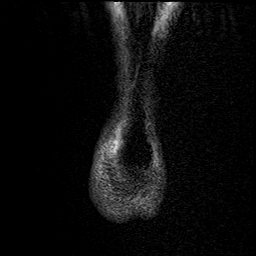
[im 42/42]
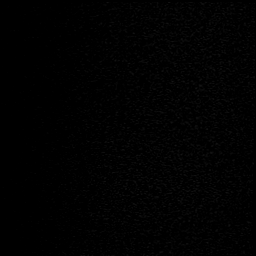

[Series 20: T1 · coronal · right · 4.0mm · 0.70mm/px · 4 of 25 slices shown]
[im 1/25]
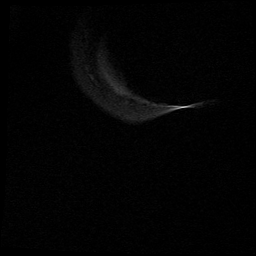
[im 9/25]
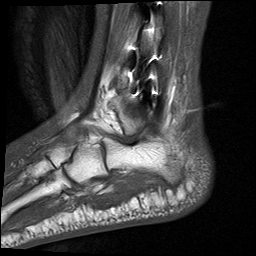
[im 17/25]
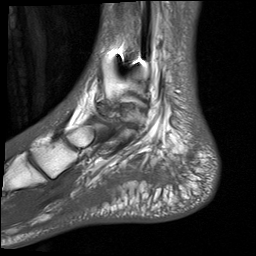
[im 25/25]
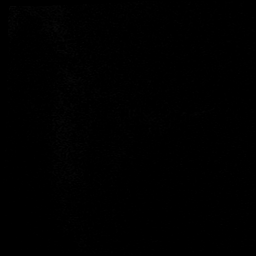

[Series 21: STIR · coronal · right · 4.0mm · 0.35mm/px · 4 of 25 slices shown]
[im 1/25]
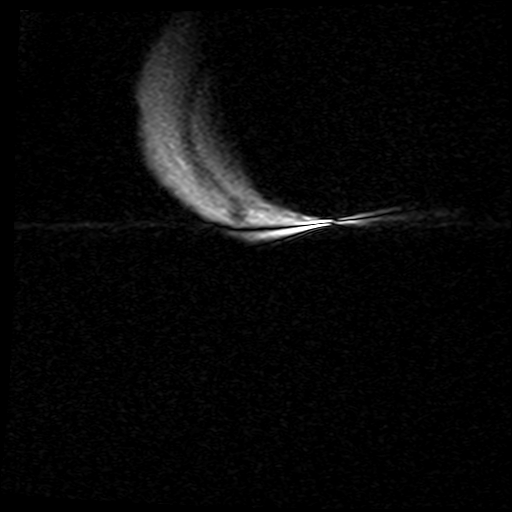
[im 9/25]
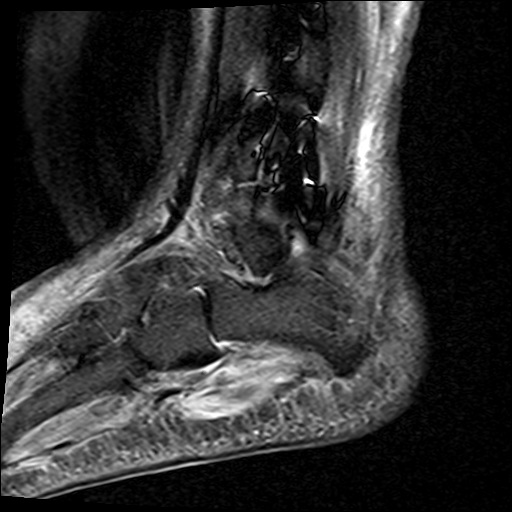
[im 17/25]
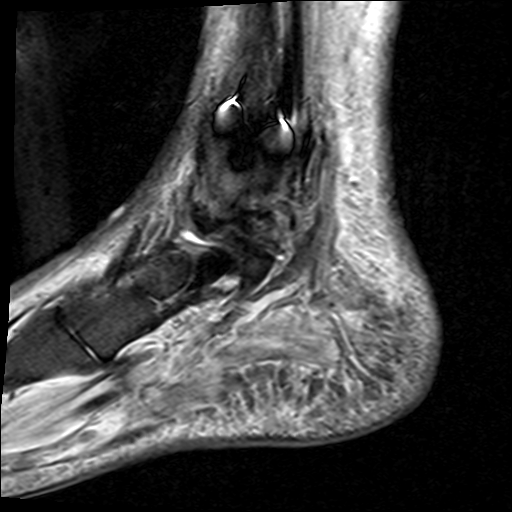
[im 25/25]
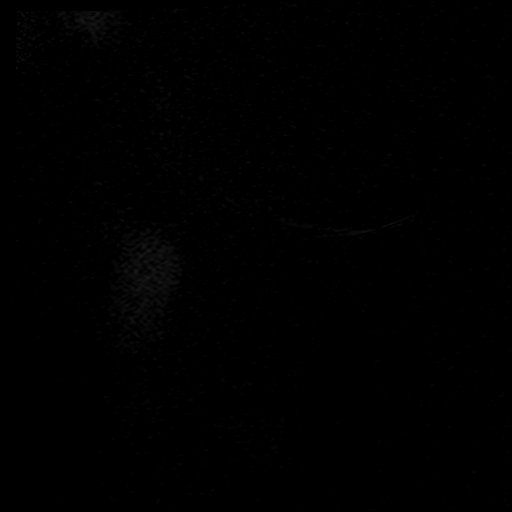

[Series 25: T1 fat-sat · axial · right · 3.0mm · 0.50mm/px · z∈[-87,+29]mm · 5 of 36 slices shown (2 of 3)]
[im 1/36]
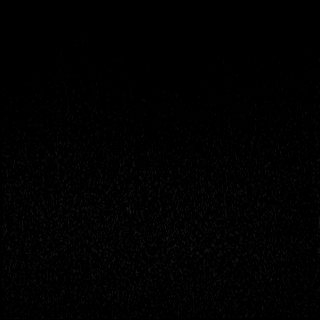
[im 9/36]
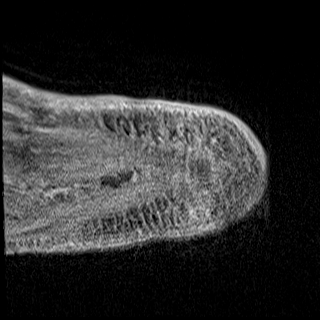
[im 18/36]
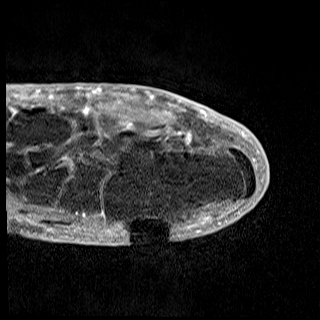
[im 27/36]
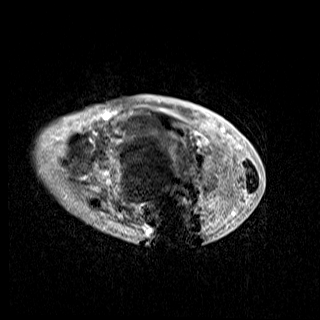
[im 36/36]
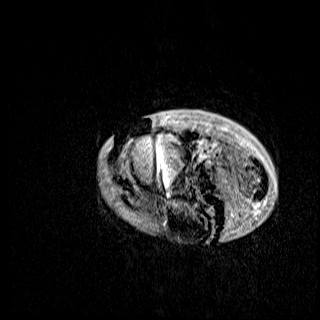

[Series 27: T1 fat-sat · sagittal · right · 3.0mm · 0.62mm/px · 6 of 42 slices shown (3 of 3)]
[im 1/42]
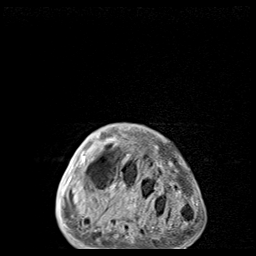
[im 9/42]
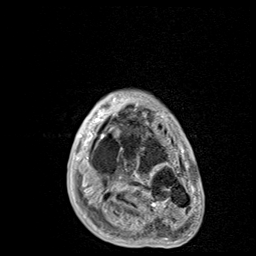
[im 17/42]
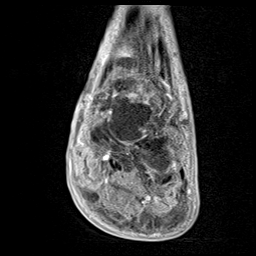
[im 25/42]
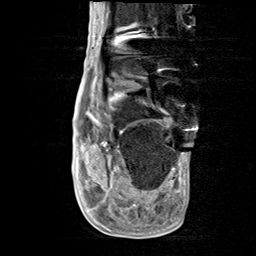
[im 33/42]
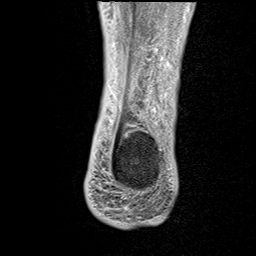
[im 42/42]
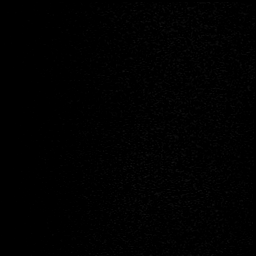

[40 of 40 positions shown; findings below may reference images not displayed]

FINDINGS: Bones/Joint/Cartilage

There is surrounding metallic artifact from prior fixation at the
distal tibia and fibula. Within the remainder of the osseous
structures there is normal osseous marrow signal. No areas of
cortical destruction or periosteal reaction. No large ankle or
subtalar joint effusion.

Ligaments

Suboptimally visualized

Muscles and Tendons

There is diffuse muscular feathery signal seen throughout the ankle.
No areas of abnormal enhancement. The visualized portions of the
Achilles tendon is intact. There is somewhat limited visualization
of the peroneal tendons, however the remainder of the flexor and
extensor tendons are intact.

Soft tissues

Diffuse subcutaneous edema seen surrounding the ankle with skin
thickening. No definite sinus tract or loculated fluid collection.
IMPRESSION: Prior fixation of the ankle with surrounding metallic artifact. No
definite evidence of osteomyelitis or soft tissue abscess.

Findings suggestive of diffuse muscular edema and cellulitis.

## 2020-06-25 IMAGING — MR MR FOOT*R* WO/W CM
9 series · 40 of 40 positions shown · IV contrast (10ml Gadavist)
Comparison: None.

CLINICAL DATA: Foot swelling diabetic osteomyelitis history of
amputation of left toes

EXAM:
MRI OF THE RIGHT FOREFOOT WITHOUT AND WITH CONTRAST
TECHNIQUE: Multiplanar, multisequence MR imaging of the right was performed
before and after the administration of intravenous contrast.
CONTRAST:  10mL GADAVIST GADOBUTROL 1 MMOL/ML IV SOLN

[Series 5: T1 · oblique · right · 3.0mm · 0.38mm/px · 5 of 45 slices shown (1 of 2)]
[im 1/45]
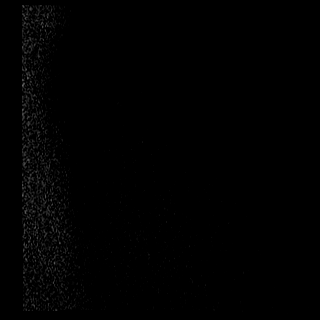
[im 12/45]
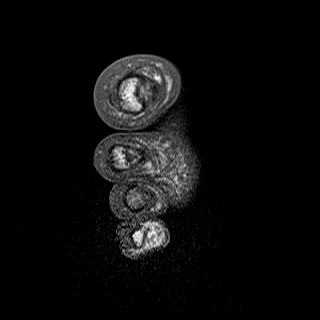
[im 23/45]
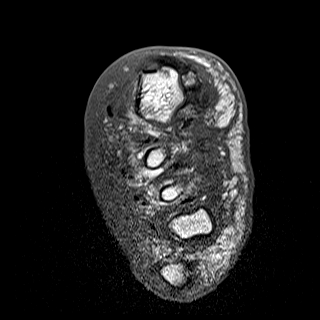
[im 34/45]
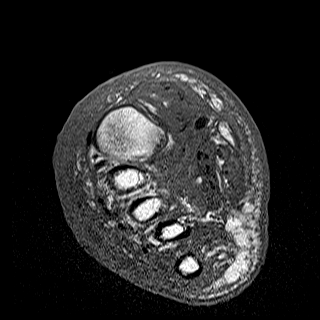
[im 45/45]
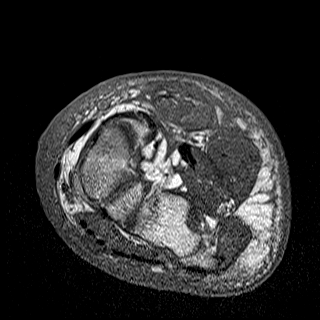

[Series 6: T2 · oblique · right · 3.0mm · 0.38mm/px · 6 of 45 slices shown (1 of 2)]
[im 1/45]
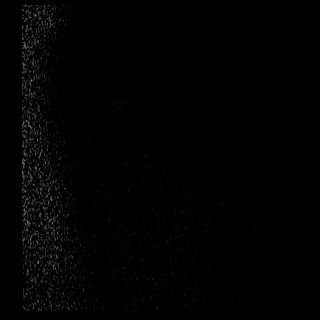
[im 9/45]
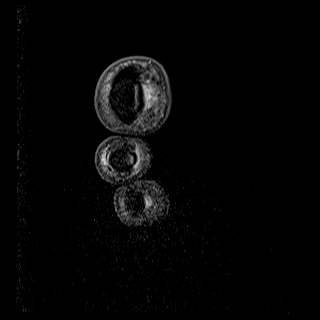
[im 18/45]
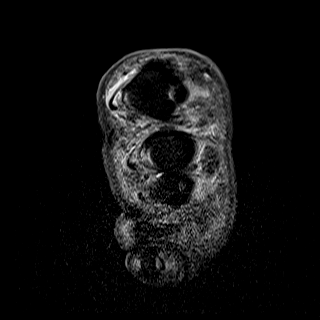
[im 27/45]
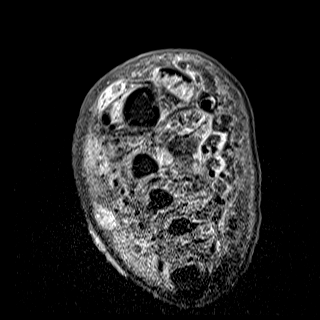
[im 36/45]
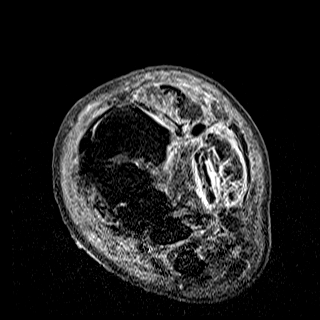
[im 45/45]
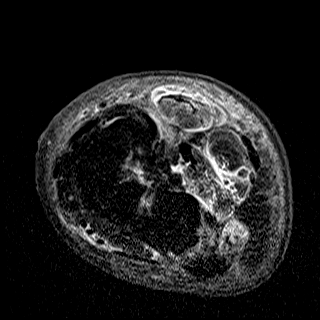

[Series 8: T1 · oblique · right · 3.0mm · 0.70mm/px · 3 of 25 slices shown (2 of 2)]
[im 1/25]
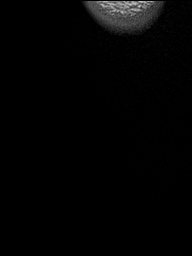
[im 13/25]
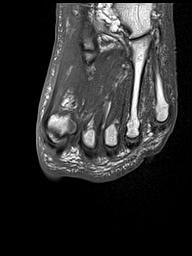
[im 25/25]
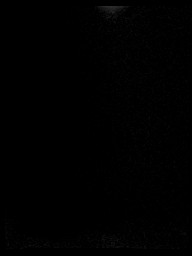

[Series 10: T2 · oblique · right · 3.0mm · 0.70mm/px · 3 of 25 slices shown (2 of 2)]
[im 1/25]
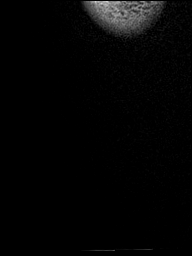
[im 13/25]
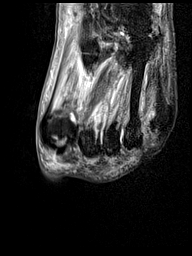
[im 25/25]
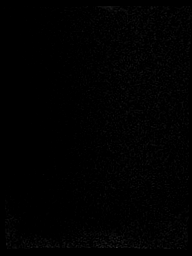

[Series 11: STIR · coronal · right · 3.0mm · 0.62mm/px · 4 of 33 slices shown]
[im 1/33]
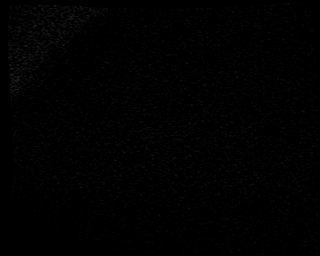
[im 11/33]
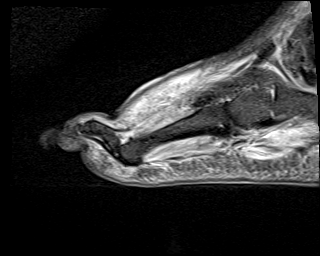
[im 22/33]
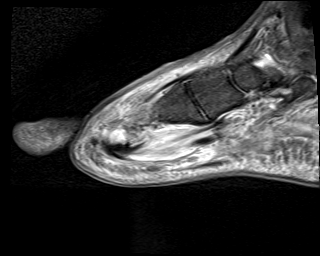
[im 33/33]
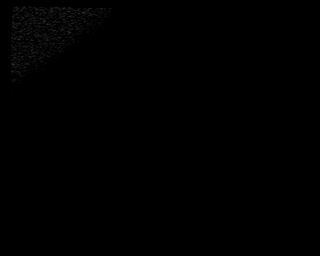

[Series 12: T1 fat-sat · oblique · non-contrast · right · 3.0mm · 0.38mm/px · 6 of 45 slices shown (1 of 3)]
[im 1/45]
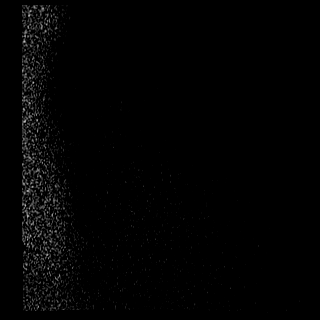
[im 9/45]
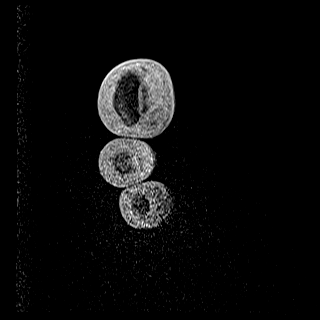
[im 18/45]
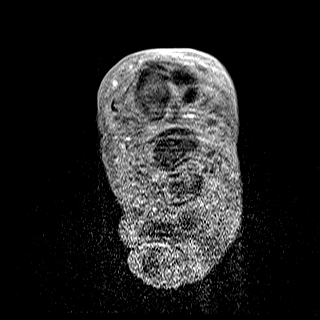
[im 27/45]
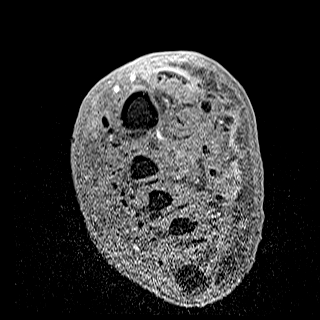
[im 36/45]
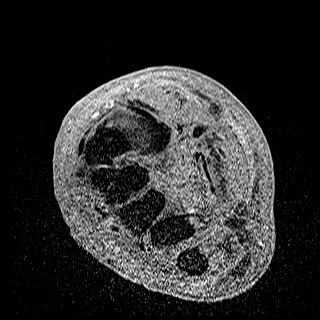
[im 45/45]
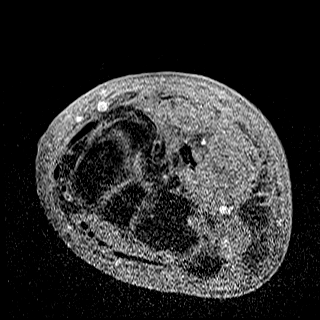

[Series 22: T1 fat-sat post-contrast · oblique · right · 3.0mm · 0.38mm/px · 6 of 45 slices shown]
[im 1/45]
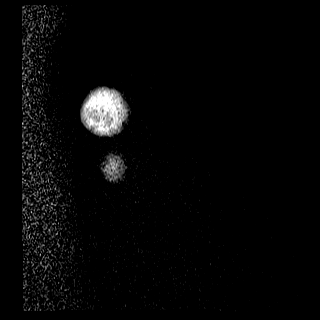
[im 9/45]
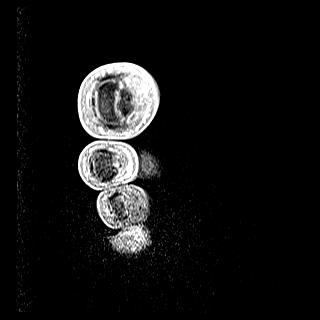
[im 18/45]
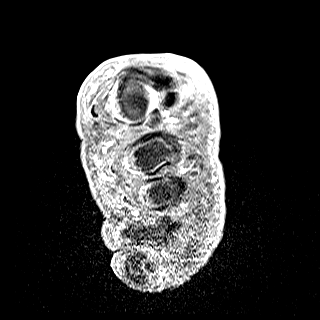
[im 27/45]
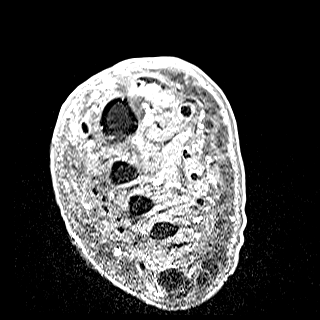
[im 36/45]
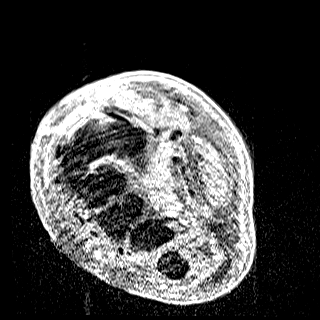
[im 45/45]
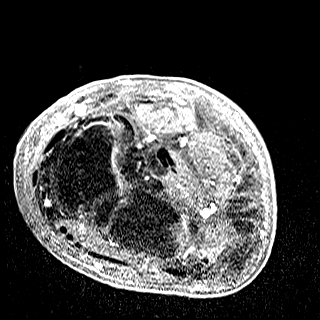

[Series 23: T1 fat-sat · coronal · right · 3.0mm · 0.62mm/px · 4 of 33 slices shown (2 of 3)]
[im 1/33]
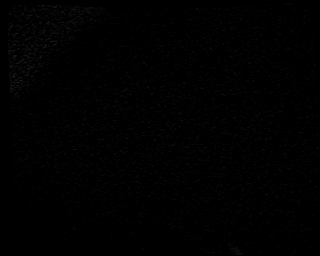
[im 11/33]
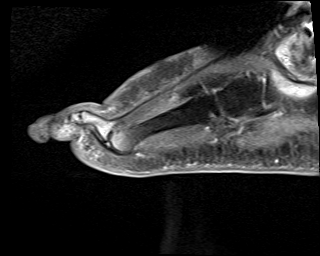
[im 22/33]
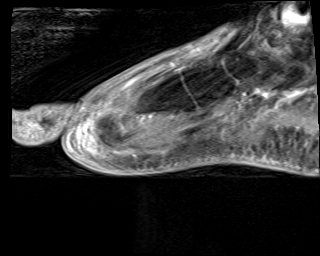
[im 33/33]
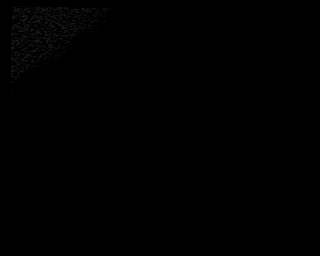

[Series 24: T1 fat-sat · oblique · right · 3.0mm · 0.70mm/px · 3 of 25 slices shown (3 of 3)]
[im 1/25]
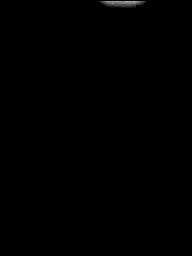
[im 13/25]
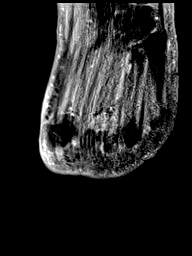
[im 25/25]
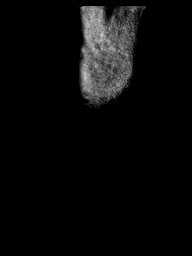

[40 of 40 positions shown; findings below may reference images not displayed]

FINDINGS: Bones/Joint/Cartilage

Normal osseous marrow signal is seen throughout. No areas of
cortical destruction or periosteal reaction. No areas of abnormal
enhancement. No large joint effusions. The articular surfaces appear
to be maintained.

Ligaments

The Lisfranc ligaments are intact.

Muscles and Tendons

There is diffuse feathery signal seen throughout the muscles
surrounding the forefoot. No areas of abnormal enhancement are seen
however within the muscles. The flexor and extensor tendons are
intact.

Soft tissues

There is diffuse subcutaneous edema and skin thickening seen
surrounding the forefoot. No focal area of ulceration or soft tissue
abscess. No sinus tract are seen.
IMPRESSION: No evidence of osteomyelitis or soft tissue abscess.

Findings suggestive of diffuse muscular edema and cellulitis.

## 2020-07-21 IMAGING — DX DG CHEST 1V PORT
1 series · 1 of 1 positions shown · non-contrast
Comparison: 05/26/2019

CLINICAL DATA: Hypoxia.

EXAM:
PORTABLE CHEST 1 VIEW

[chest ap]
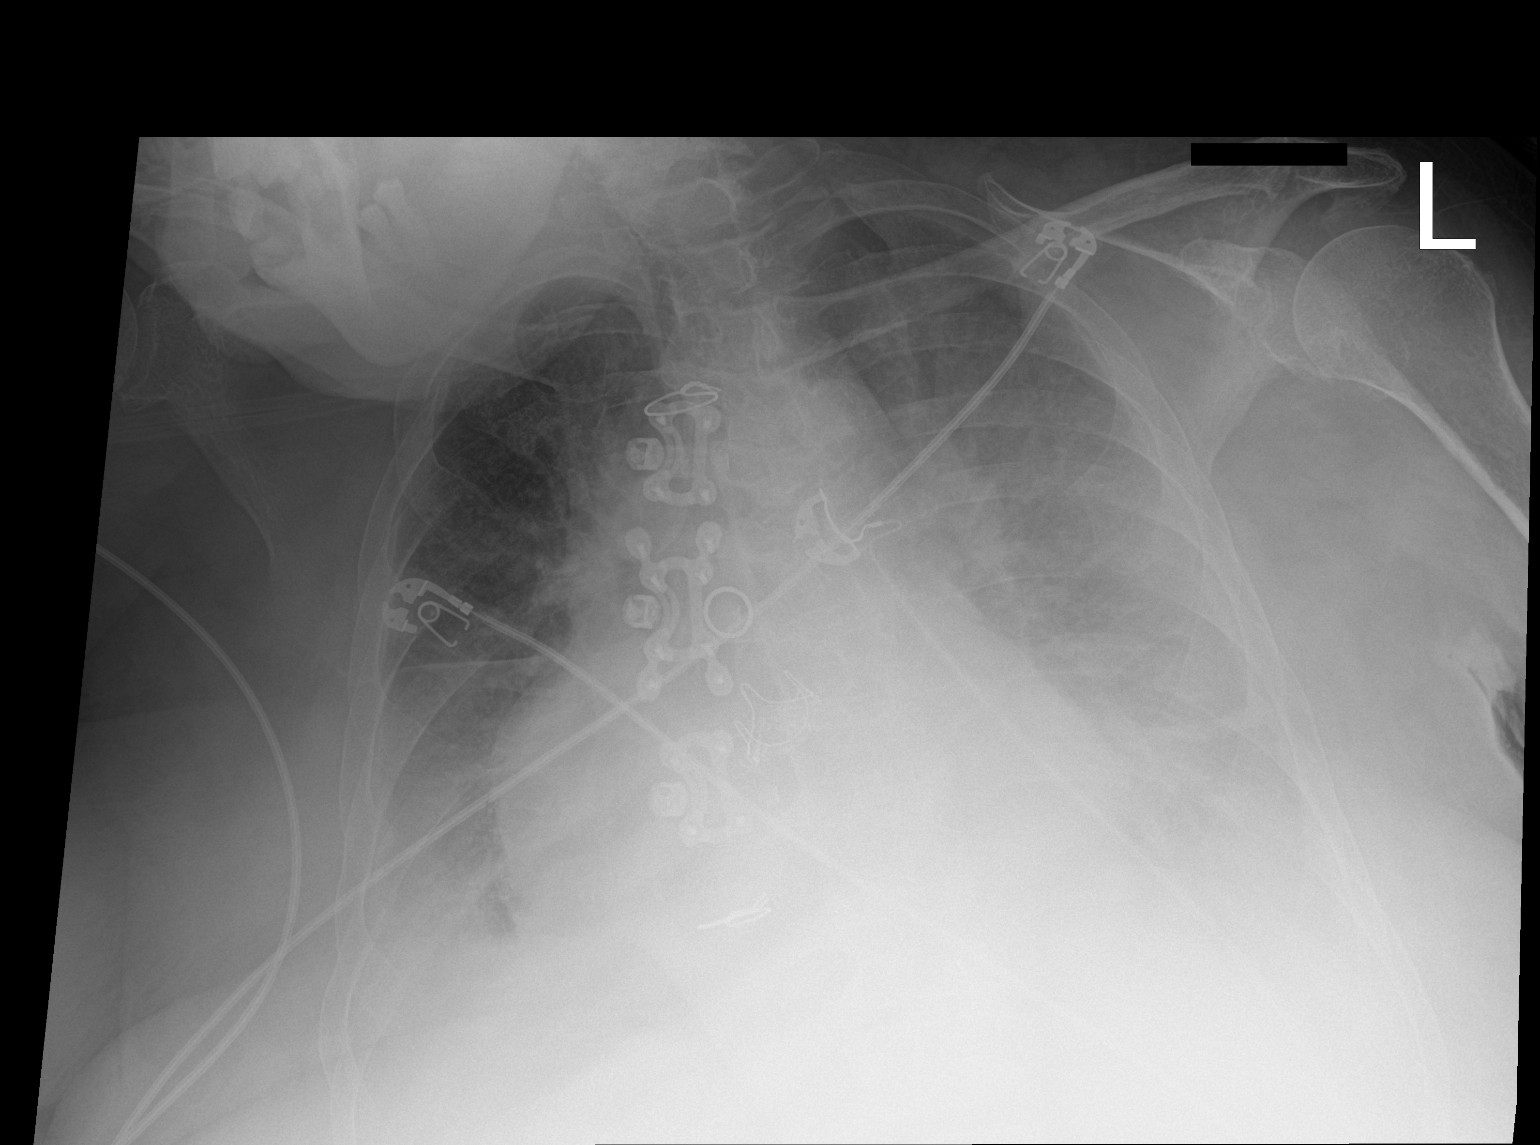

[1 of 1 positions shown; findings below may reference images not displayed]

FINDINGS: Cardiomegaly. There has been aortic valve replacement and CABG.
Diffuse hazy appearance of the lungs with pleural fluid and vascular
congestion. No visible air leak.
IMPRESSION: Low volume chest with hazy opacity from pleural fluid and presumed
atelectasis. Suspect CHF.

## 2020-07-21 IMAGING — MR MR FOOT*R* W/O CM
5 series · 40 of 40 positions shown · non-contrast
Comparison: MRI 06/01/2019

CLINICAL DATA: Right foot wound

EXAM:
MRI OF THE RIGHT FOREFOOT WITHOUT CONTRAST
TECHNIQUE: Multiplanar, multisequence MR imaging of the right forefoot was
performed. No intravenous contrast was administered.

[Series 4: T1 · coronal · right · 3.0mm · 0.47mm/px · 11 of 49 slices shown (1 of 2)]
[im 1/49]
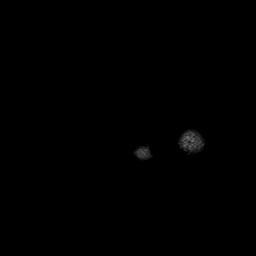
[im 5/49]
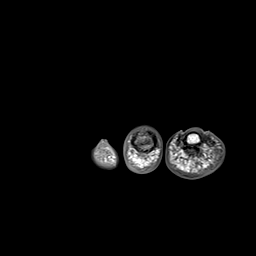
[im 10/49]
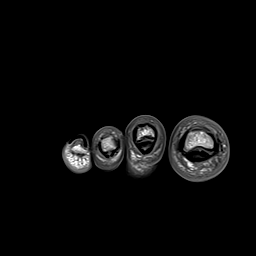
[im 15/49]
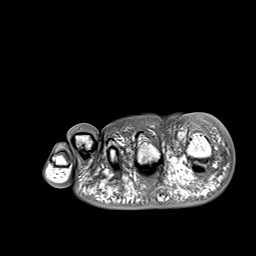
[im 20/49]
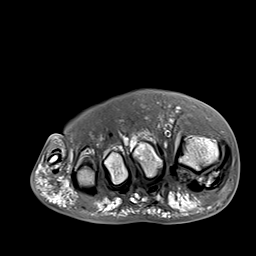
[im 25/49]
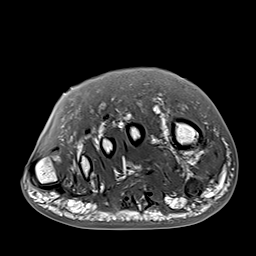
[im 29/49]
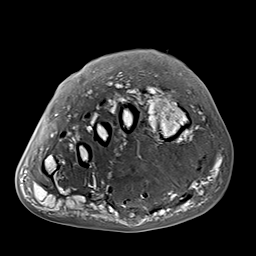
[im 34/49]
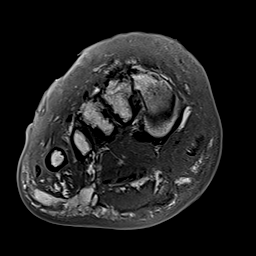
[im 39/49]
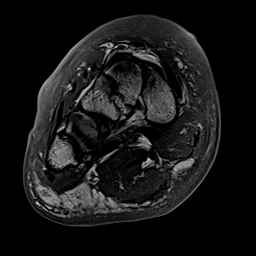
[im 44/49]
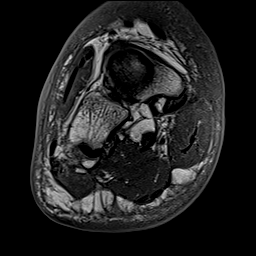
[im 49/49]
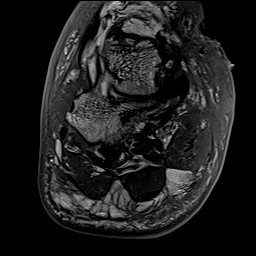

[Series 5: T2 fat-sat · coronal · right · 3.0mm · 0.38mm/px · 11 of 51 slices shown (1 of 2)]
[im 1/51]
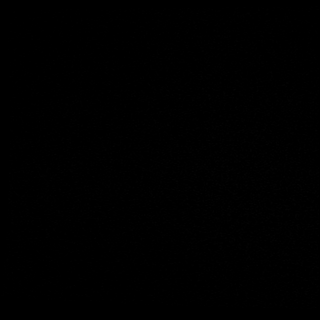
[im 6/51]
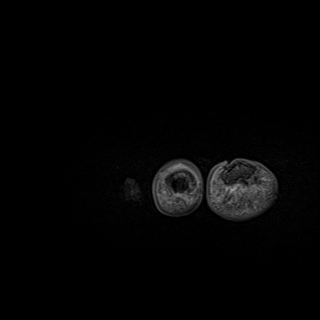
[im 11/51]
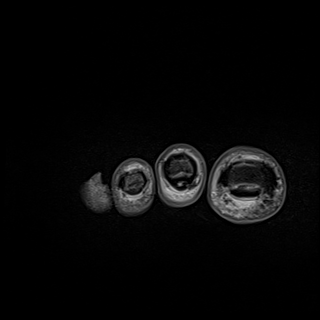
[im 16/51]
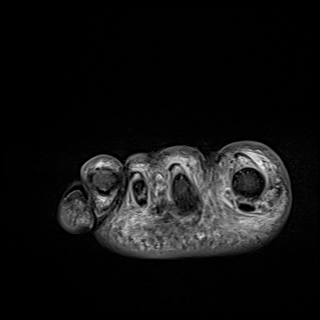
[im 21/51]
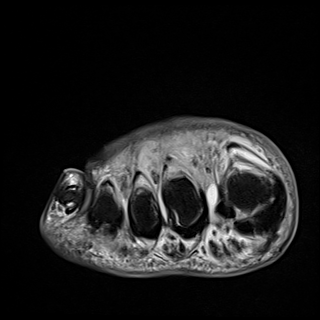
[im 26/51]
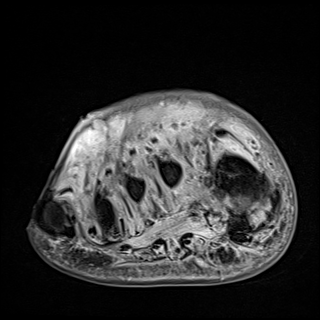
[im 31/51]
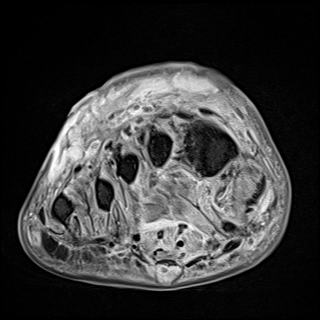
[im 36/51]
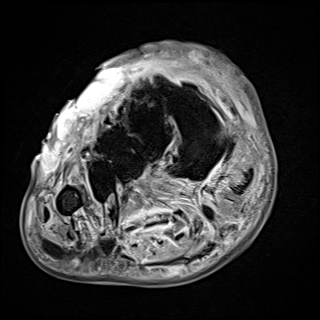
[im 41/51]
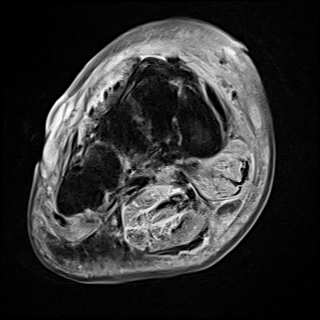
[im 46/51]
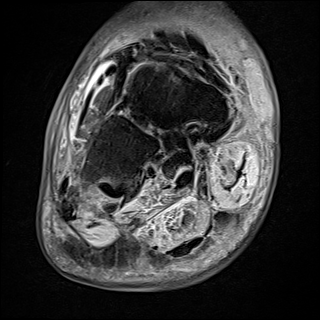
[im 51/51]
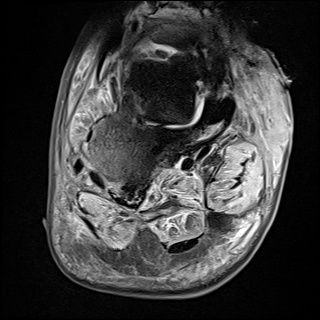

[Series 6: T2 fat-sat · axial · right · 3.0mm · 0.70mm/px · z∈[-134,-50]mm · 6 of 29 slices shown (2 of 2)]
[im 1/29]
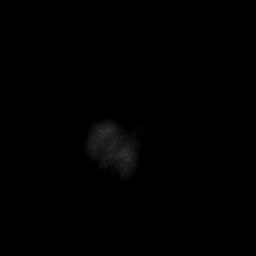
[im 6/29]
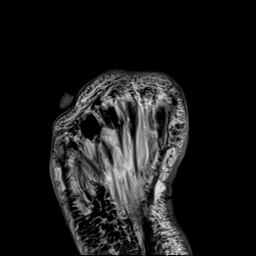
[im 12/29]
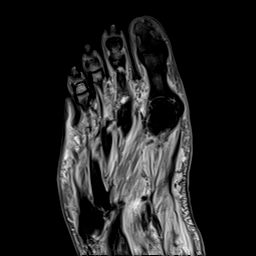
[im 17/29]
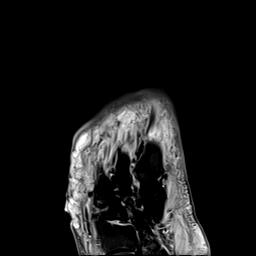
[im 23/29]
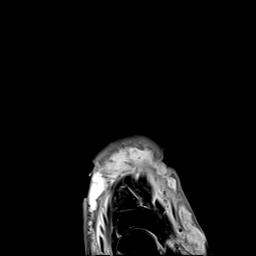
[im 29/29]
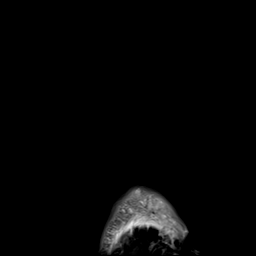

[Series 7: T1 · axial · right · 3.0mm · 0.70mm/px · z∈[-134,-50]mm · 6 of 29 slices shown (2 of 2)]
[im 1/29]
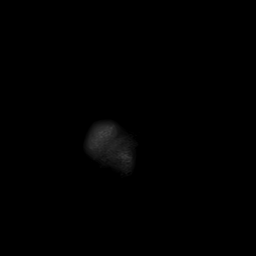
[im 6/29]
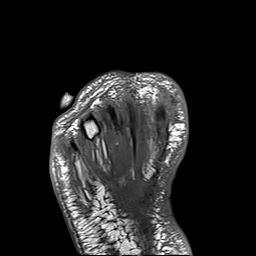
[im 12/29]
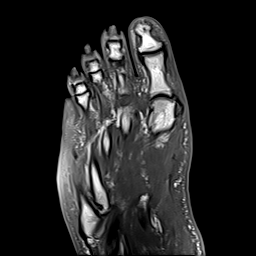
[im 17/29]
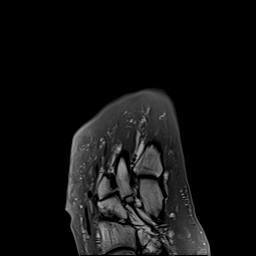
[im 23/29]
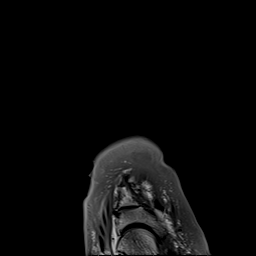
[im 29/29]
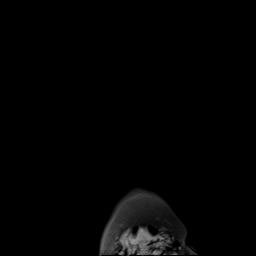

[Series 8: STIR · sagittal · right · 3.0mm · 0.35mm/px · 6 of 29 slices shown]
[im 1/29]
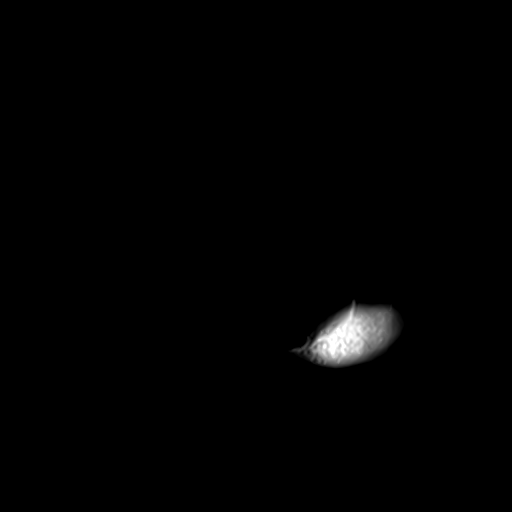
[im 6/29]
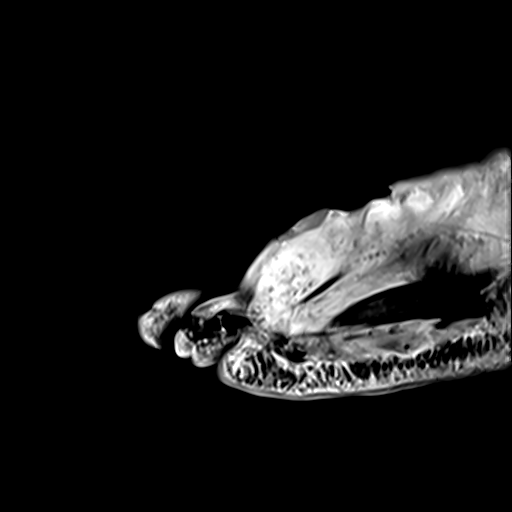
[im 12/29]
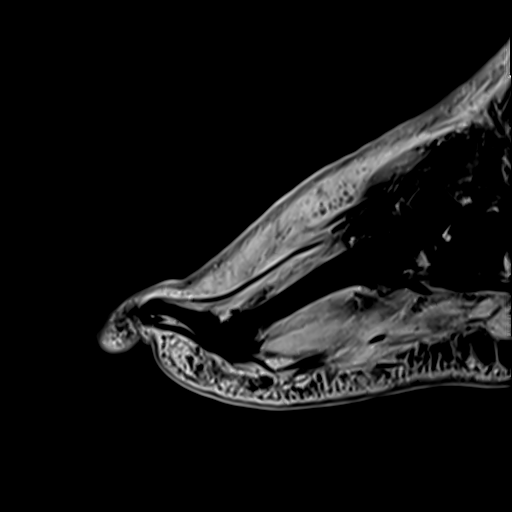
[im 17/29]
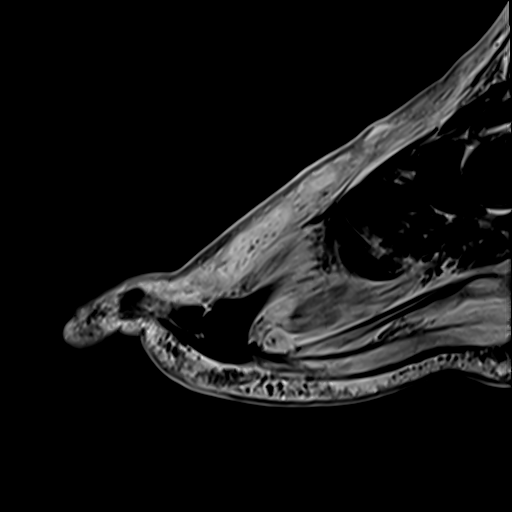
[im 23/29]
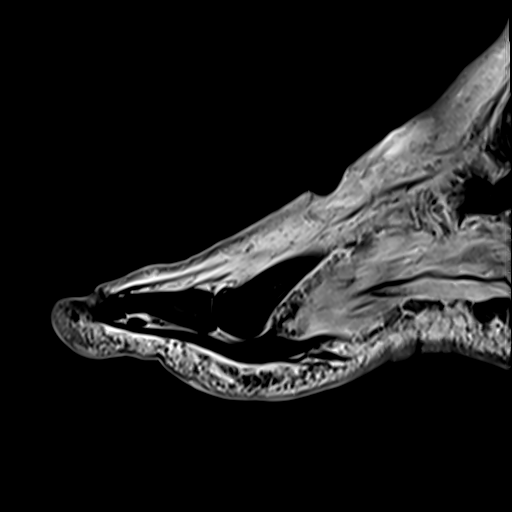
[im 29/29]
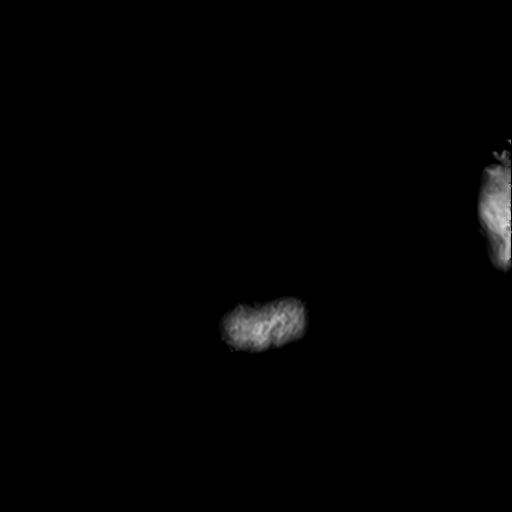

[40 of 40 positions shown; findings below may reference images not displayed]

FINDINGS: Bones/Joint/Cartilage

No acute fracture. No dislocation. No bone marrow edema. There is
preservation of the fatty T1 bone marrow signal throughout the right
forefoot. No cortical destruction. Similar mild degenerative
changes. No large joint effusion.

Ligaments

Intact Lisfranc ligament. Collateral ligaments of the forefoot
appear intact.

Muscles and Tendons

Diffuse edema like signal throughout the intrinsic foot musculature
suggesting a nonspecific myositis. Small volume tenosynovial fluid
within the extensor digitorum longus tendon sheath at the level of
the midfoot. Flexor tendons appear unremarkable.

Soft tissues

Marked dorsal soft tissue swelling and subcutaneous edema with
multiple areas of dorsal skin irregularity suggesting wounds or
ulcerations. Slightly ill-defined multilobulated fluid collection at
the dorsal aspect of the proximal forefoot laterally measuring
approximately 4.8 x 1.0 x 7.4 cm (series 5, image 36; series 6,
image 14). Numerous additional lobulated areas of fluid are seen
more proximally within the midfoot as well as the medial hindfoot
(series 5, images 44 and 51). There is intermetatarsal bursal fluid
within the first and second intermetatarsal spaces.
IMPRESSION: 1. No evidence of acute osteomyelitis of the right forefoot.
2. Marked dorsal soft tissue swelling and subcutaneous edema with
multiple areas of dorsal skin irregularity suggesting wounds or
ulcerations. Slightly ill-defined multilobulated fluid collection at
the dorsal aspect of the proximal forefoot laterally measuring 4.8 x
1.0 x 7.4 cm. Numerous additional lobulated areas of fluid are seen
more proximally within the dorsal midfoot as well as the medial
hindfoot. Findings are concerning for developing abscesses.
3. Diffuse edema like signal throughout the intrinsic foot
musculature suggesting a nonspecific myositis.
4. Mild extensor digitorum longus tenosynovitis.

## 2020-07-21 IMAGING — CT CT HEAD W/O CM
3 series · 14 of 47 positions shown, 16 images · non-contrast
Comparison: 11/20/2018

CLINICAL DATA: Altered mental status

EXAM:
CT HEAD WITHOUT CONTRAST
TECHNIQUE: Contiguous axial images were obtained from the base of the skull
through the vertex without intravenous contrast.

[Series 2: head wo · axial · 0.46mm/px · z∈[-102,+23]mm · 8 of 31 slices shown, 10 images]
[im 3/31  brain]
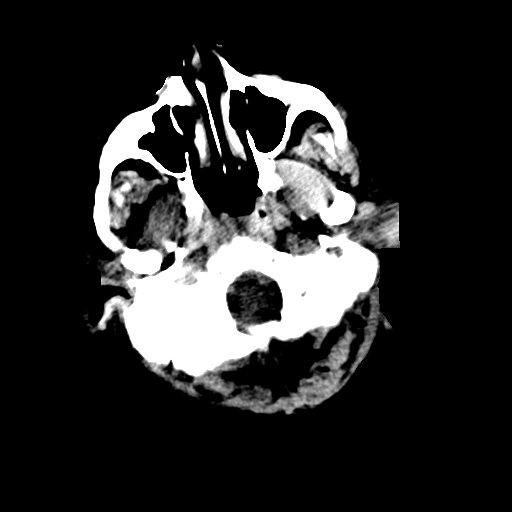
[im 3/31  bone]
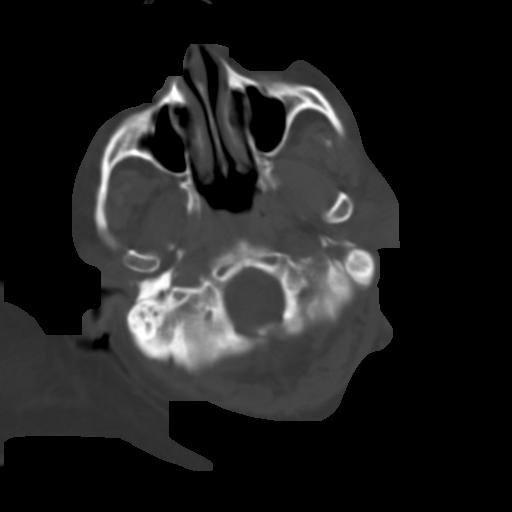
[im 7/31  brain]
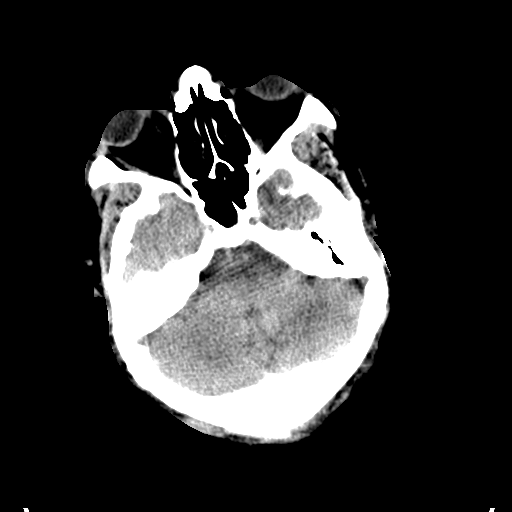
[im 10/31  brain]
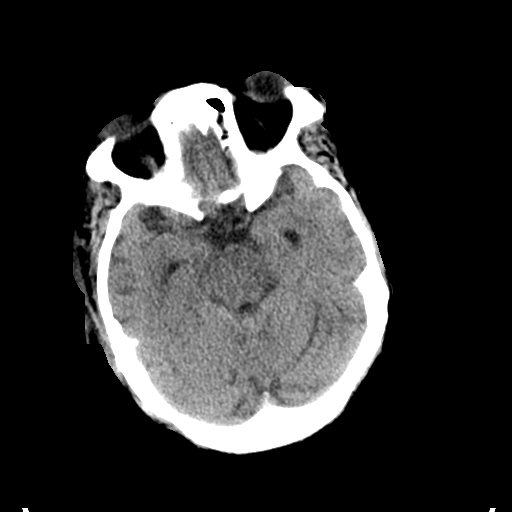
[im 14/31  brain]
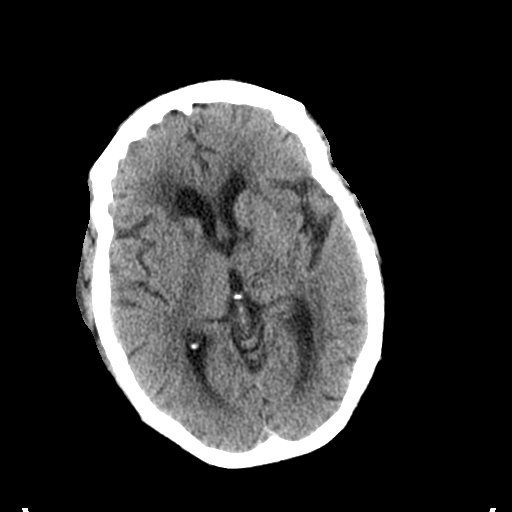
[im 17/31  brain]
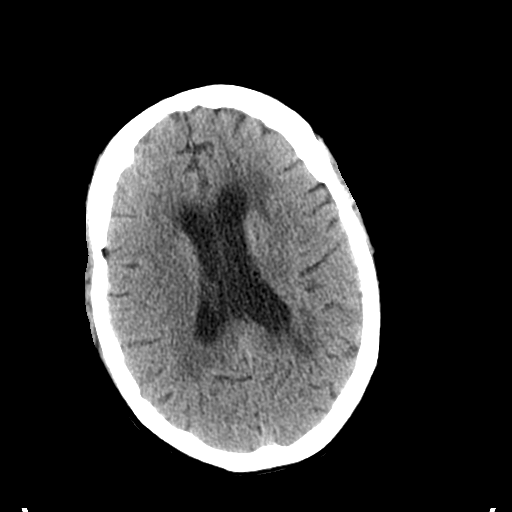
[im 17/31  bone]
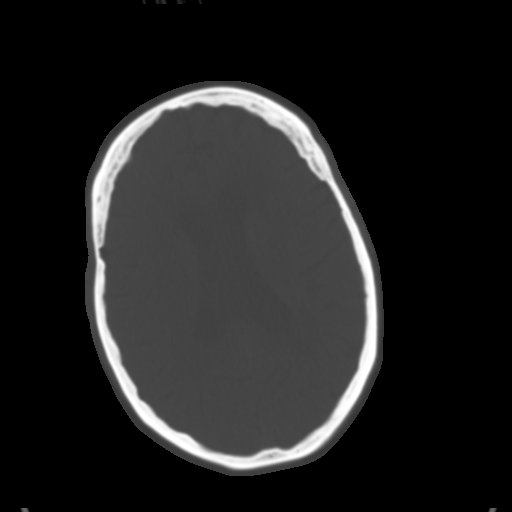
[im 21/31  brain]
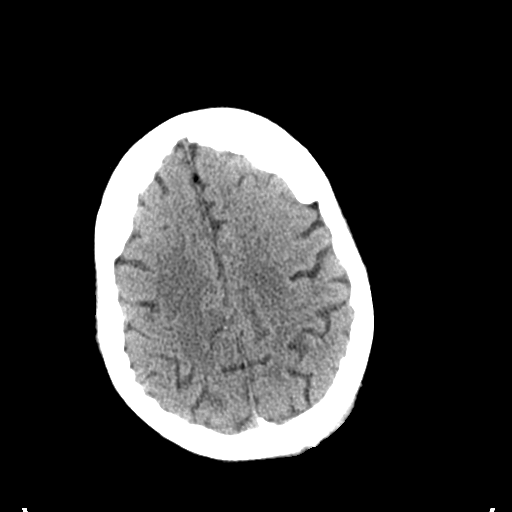
[im 24/31  brain]
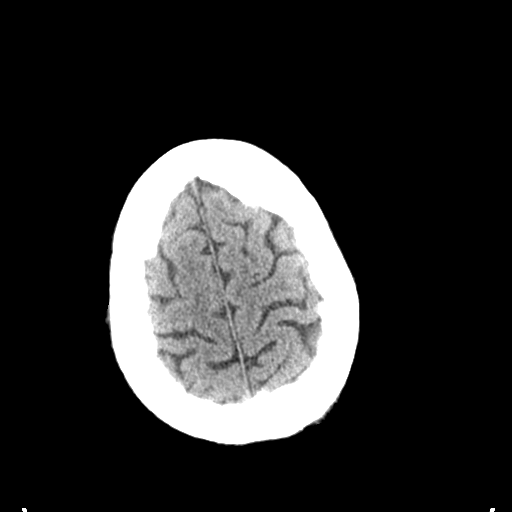
[im 28/31  brain]
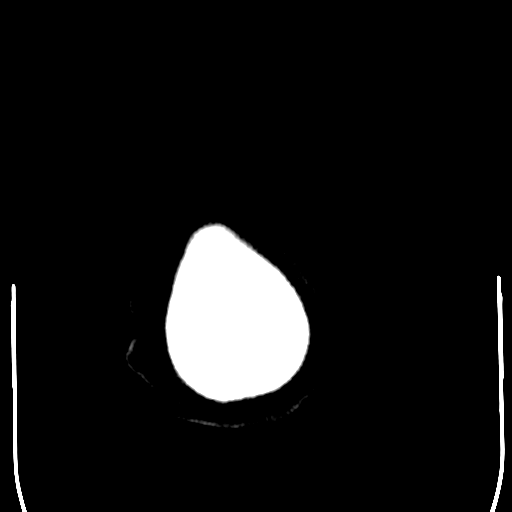

[Series 5: coronal soft tissue · coronal · 0.29mm/px · 3 of 84 slices shown]
[im 28/84  brain]
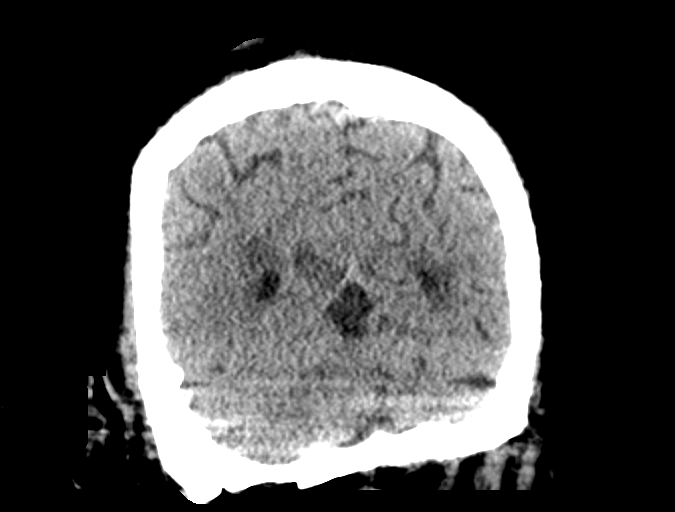
[im 37/84  brain]
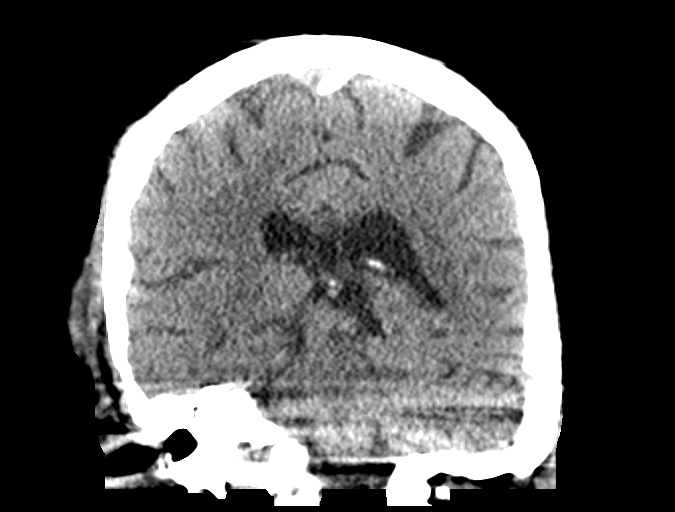
[im 47/84  brain]
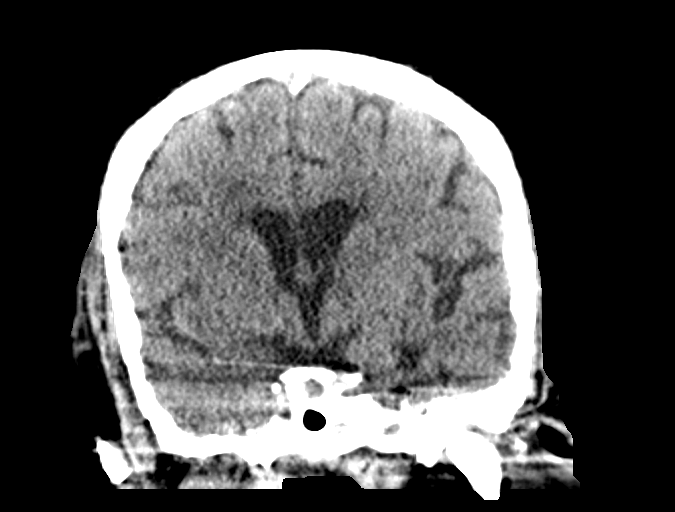

[Series 6: sagittal soft tissue · sagittal · 0.29mm/px · 3 of 68 slices shown]
[im 23/68  brain]
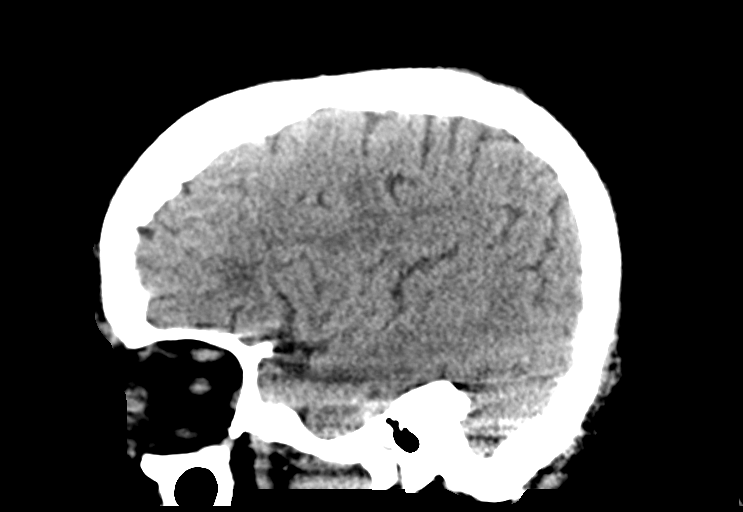
[im 34/68  brain]
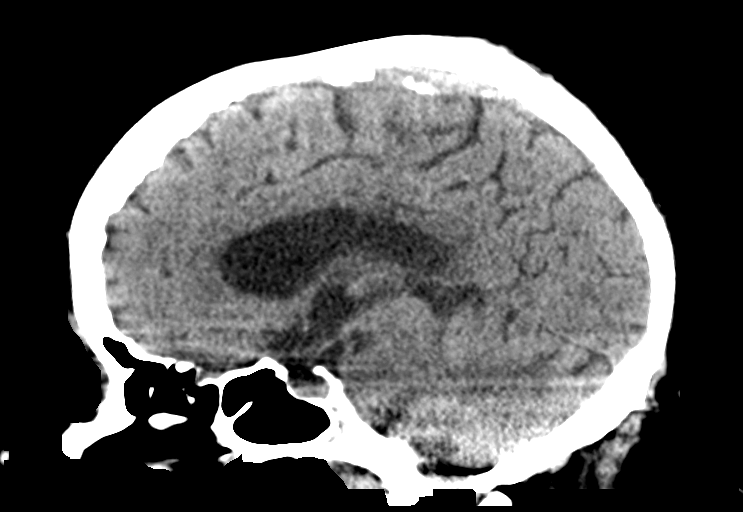
[im 45/68  brain]
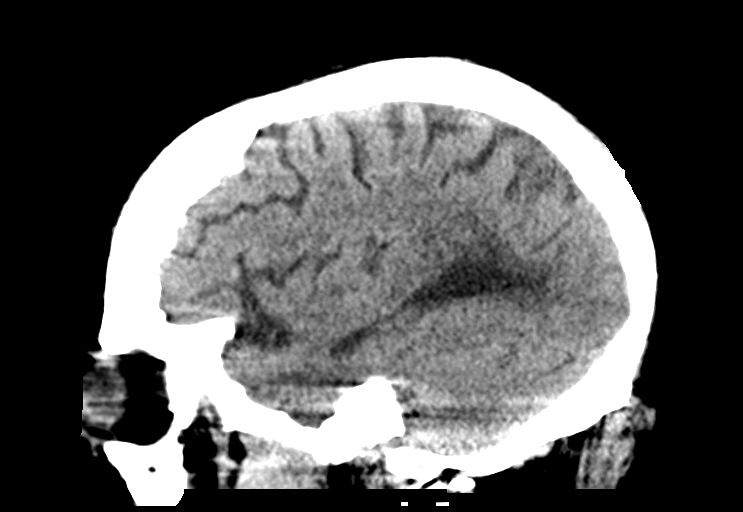

[14 of 47 positions shown; findings below may reference images not displayed]

FINDINGS: Brain: There is atrophy and chronic small vessel disease changes.
Old bilateral basal ganglia lacunar infarcts. No acute intracranial
abnormality. Specifically, no hemorrhage, hydrocephalus, mass
lesion, acute infarction, or significant intracranial injury.

Vascular: No hyperdense vessel or unexpected calcification.

Skull: No acute calvarial abnormality.

Sinuses/Orbits: Visualized paranasal sinuses and mastoids clear.
Orbital soft tissues unremarkable.

Other: None
IMPRESSION: Old bilateral basal ganglia lacunar infarcts.

Atrophy, chronic microvascular disease.

No acute intracranial abnormality.

## 2020-07-21 IMAGING — MR MR FOOT*L* W/O CM
4 series · 40 of 40 positions shown · non-contrast
Comparison: X-ray 06/21/2018

CLINICAL DATA: Left foot ulceration

EXAM:
MRI OF THE LEFT FOOT WITHOUT CONTRAST
TECHNIQUE: Multiplanar, multisequence MR imaging of the left forefoot was
performed. No intravenous contrast was administered.

[Series 5: T1 · axial · left · 3.0mm · 0.70mm/px · z∈[-84,-10]mm · 9 of 25 slices shown (1 of 3)]
[im 1/25]
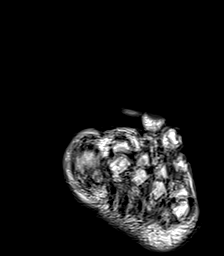
[im 4/25]
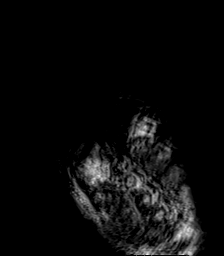
[im 7/25]
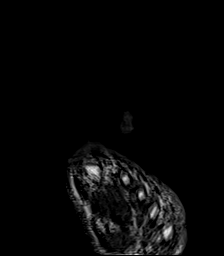
[im 10/25]
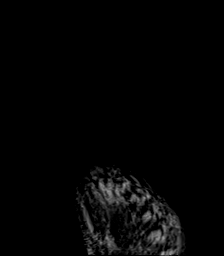
[im 13/25]
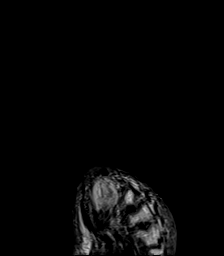
[im 16/25]
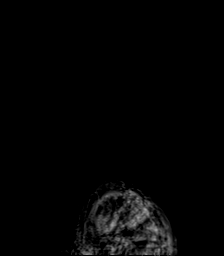
[im 19/25]
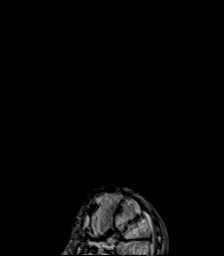
[im 22/25]
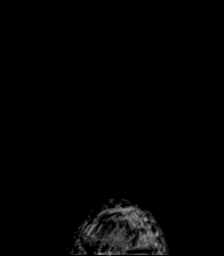
[im 25/25]
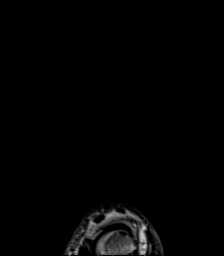

[Series 7: T1 · axial · left · 3.0mm · 0.70mm/px · z∈[-86,-12]mm · 9 of 25 slices shown (2 of 3)]
[im 1/25]
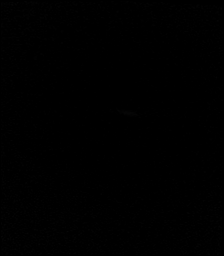
[im 4/25]
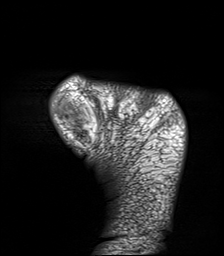
[im 7/25]
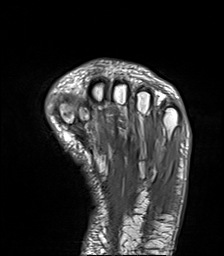
[im 10/25]
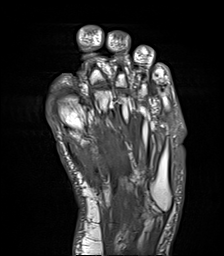
[im 13/25]
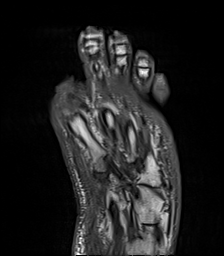
[im 16/25]
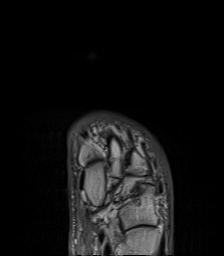
[im 19/25]
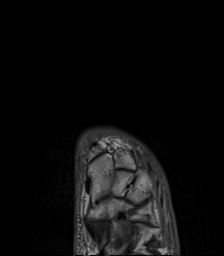
[im 22/25]
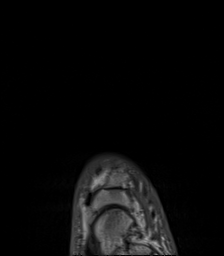
[im 25/25]
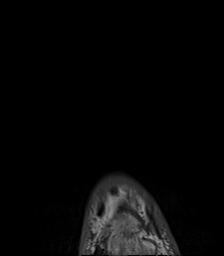

[Series 8: T2 fat-sat · axial · left · 3.0mm · 0.70mm/px · z∈[-86,-12]mm · 9 of 25 slices shown]
[im 1/25]
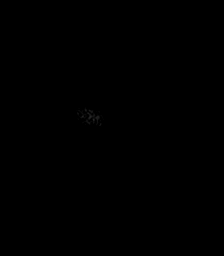
[im 4/25]
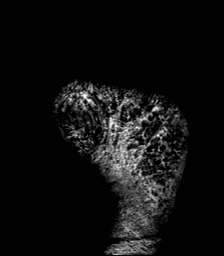
[im 7/25]
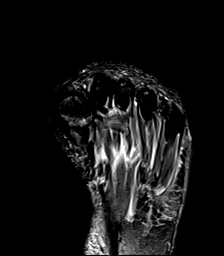
[im 10/25]
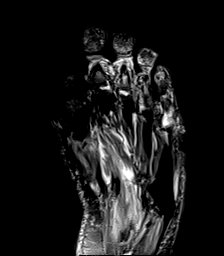
[im 13/25]
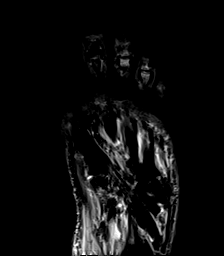
[im 16/25]
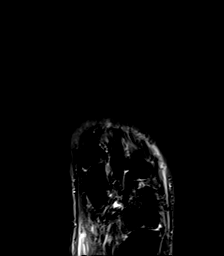
[im 19/25]
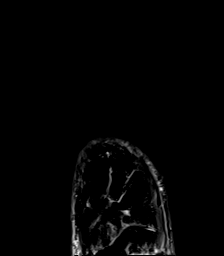
[im 22/25]
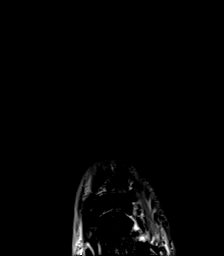
[im 25/25]
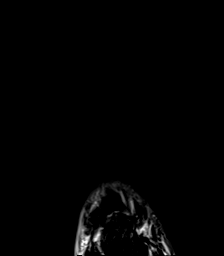

[Series 9: T1 · coronal · left · 3.0mm · 0.47mm/px · 13 of 38 slices shown (3 of 3)]
[im 1/38]
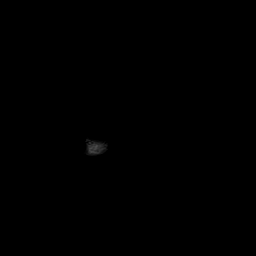
[im 4/38]
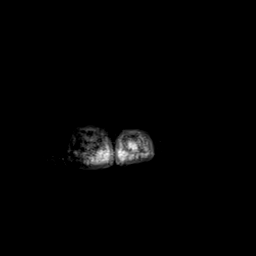
[im 7/38]
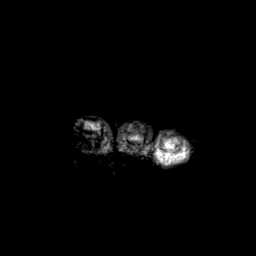
[im 10/38]
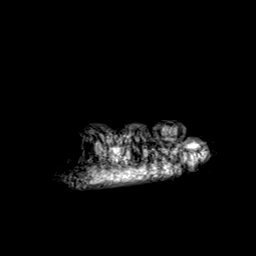
[im 13/38]
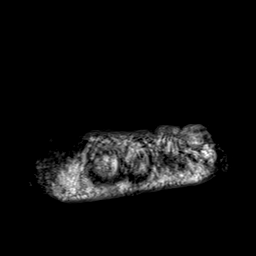
[im 16/38]
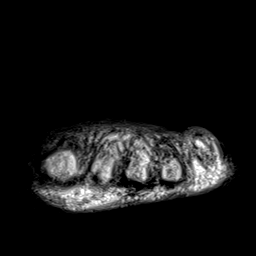
[im 19/38]
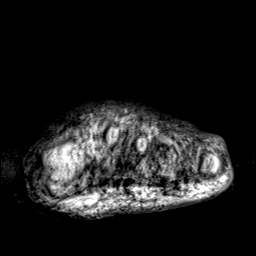
[im 22/38]
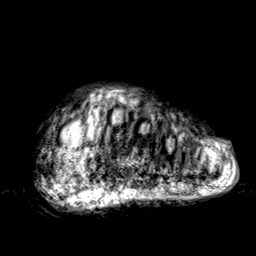
[im 25/38]
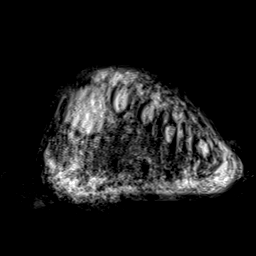
[im 28/38]
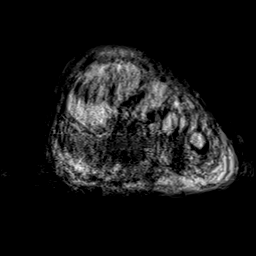
[im 31/38]
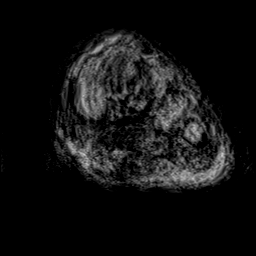
[im 34/38]
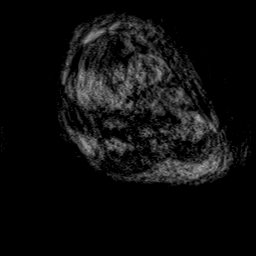
[im 38/38]
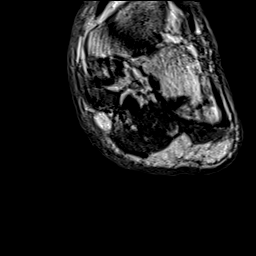

[40 of 40 positions shown; findings below may reference images not displayed]

FINDINGS: Technical note: Significantly limited exam. The obtained sequences
are significantly motion degraded. No short axis fluid sensitive
sequence was obtained. No sagittal sequence was obtained.

Bones/Joint/Cartilage

Interval amputation of the left great toe at the first MTP joint.
Subtle edema within the first metatarsal head with preservation of
the fatty T1 bone marrow signal. The remaining included osseous
structures appear to have preserved T1 marrow signal. No evidence of
cortical destruction. No acute fracture identified. No dislocation.

Ligaments

Grossly intact.

Muscles and Tendons

Diffuse edema like intramuscular signal suggesting a nonspecific
myositis. No appreciable tenosynovial fluid collection on the
included images.

Soft tissues

No evidence of a large soft tissue ulceration. No appreciable fluid
collection. There is mild soft tissue swelling.
IMPRESSION: 1. Significantly limited, motion degraded exam. Patient was unable
to complete the full examination.
2. Interval amputation of the left great toe at the first MTP joint.
Subtle edema within the first metatarsal head with preservation of
the fatty T1 bone marrow signal. Findings are favored to represent
reactive osteitis. Early acute osteomyelitis is felt to be unlikely
but would be difficult to entirely exclude on the provided images.
3. Diffuse edema-like intramuscular signal suggesting a nonspecific
myositis.

## 2020-07-22 IMAGING — DX DG CHEST 1V PORT
1 series · 1 of 1 positions shown · non-contrast
Comparison: Yesterday

CLINICAL DATA: Pulmonary edema

EXAM:
PORTABLE CHEST 1 VIEW

[chest ap]
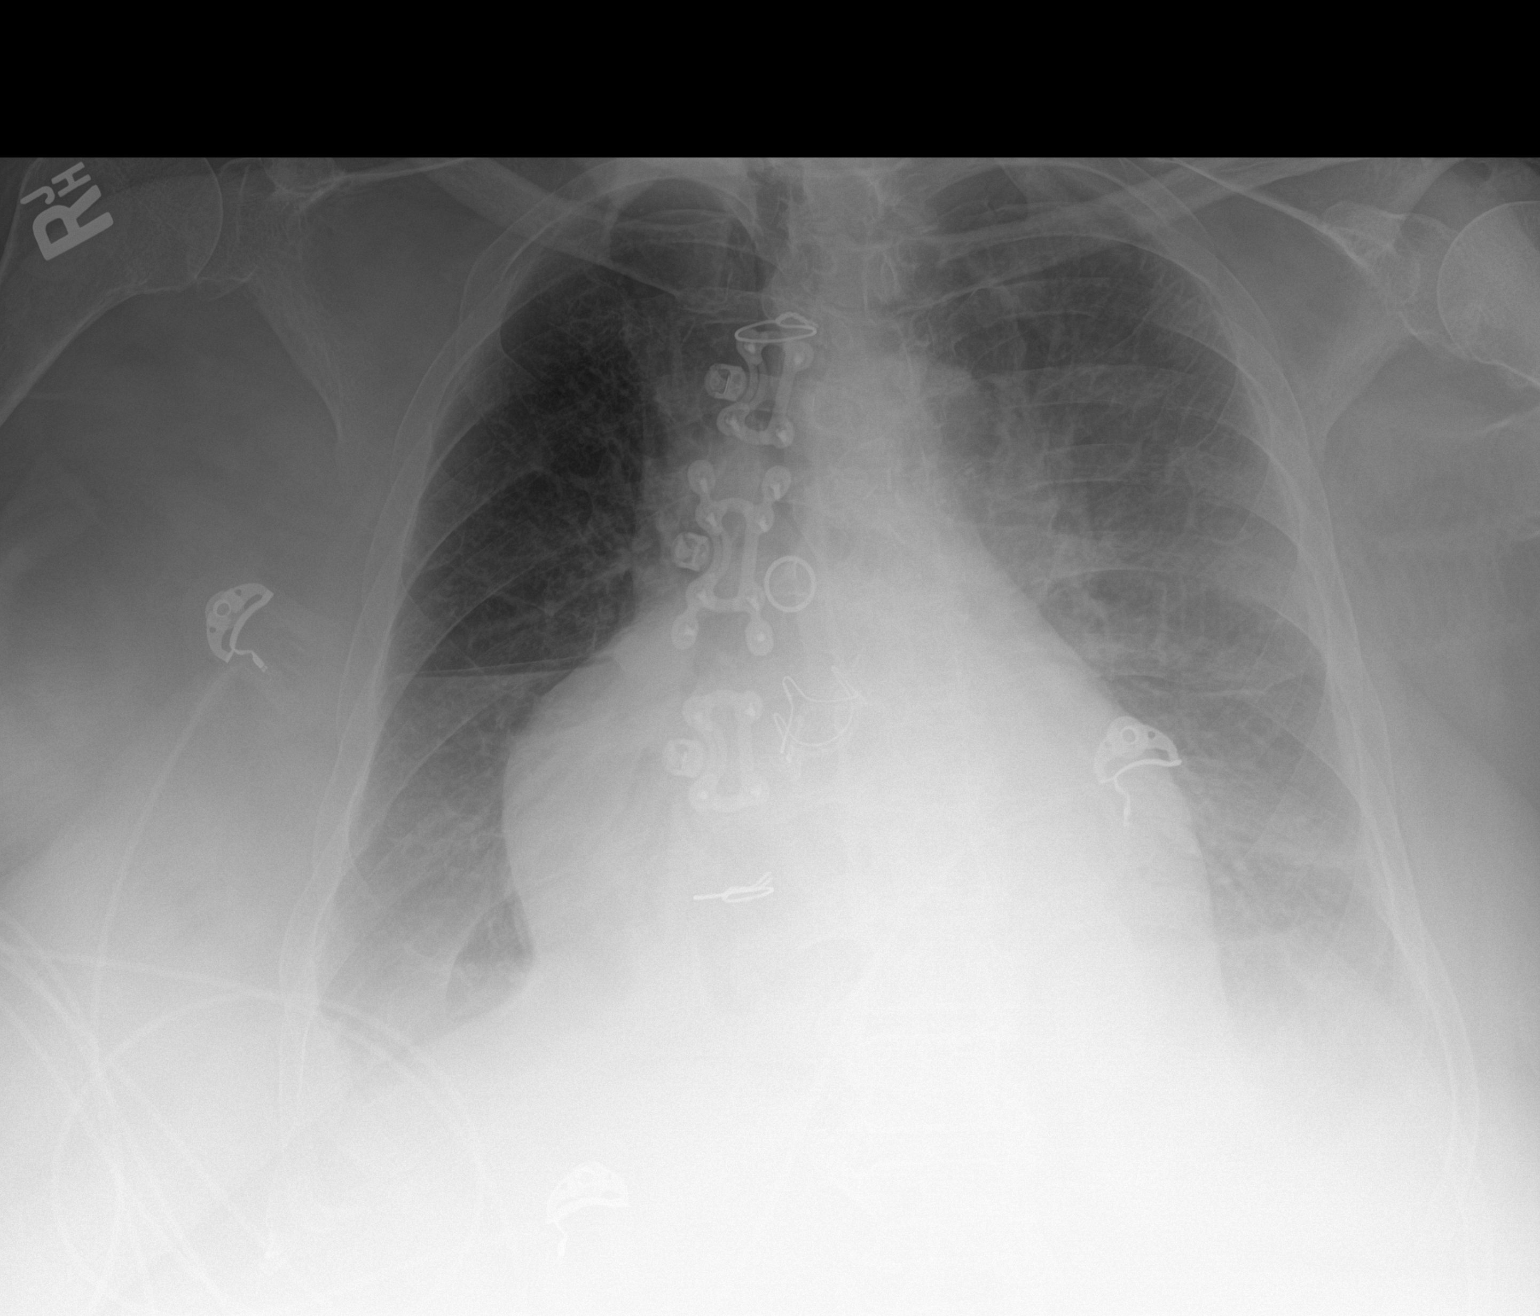

[1 of 1 positions shown; findings below may reference images not displayed]

FINDINGS: Cardiomegaly. Aortic valve replacement and CABG. Hazy bilateral
chest, worse on the left, attributed to layering pleural fluid and
presumed atelectasis. No superimposed Kerley lines clearly seen. No
pneumothorax
IMPRESSION: No progression of the hazy left more than right chest usually from
atelectasis and layering effusions.

## 2020-07-22 IMAGING — MR MR [PERSON_NAME] LOW W/O CM*L*
4 of 7 series · 28 of 40 positions shown · non-contrast
Comparison: None.

CLINICAL DATA: Left lower leg swelling with multiple wounds
present.

EXAM:
MRI OF LOWER LEFT EXTREMITY WITHOUT CONTRAST
TECHNIQUE: Multiplanar, multisequence MR imaging of the left lower leg was
performed. No intravenous contrast was administered.

[Series 4: T1 · coronal · left · 4.0mm · 1.88mm/px · 5 of 30 slices shown (1 of 3)]
[im 1/30]
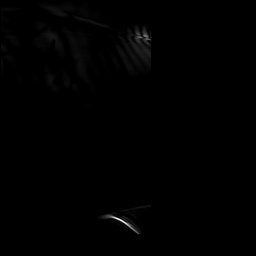
[im 8/30]
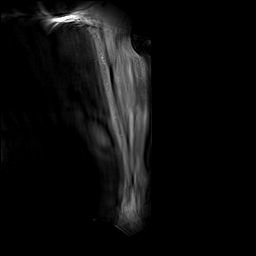
[im 15/30]
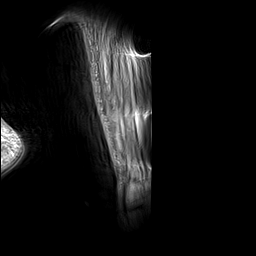
[im 22/30]
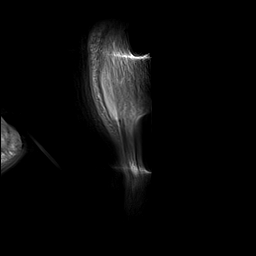
[im 30/30]
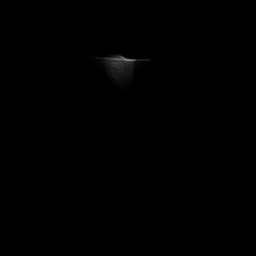

[Series 5: T1 · coronal · left · 4.0mm · 2.50mm/px · 5 of 30 slices shown (2 of 3)]
[im 1/30]
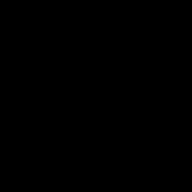
[im 8/30]
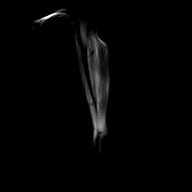
[im 15/30]
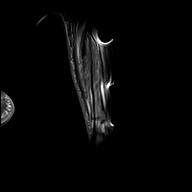
[im 22/30]
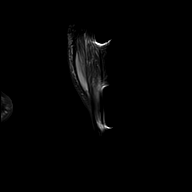
[im 30/30]
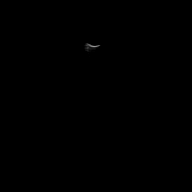

[Series 6: STIR · coronal · left · 4.0mm · 1.95mm/px · 5 of 30 slices shown]
[im 1/30]
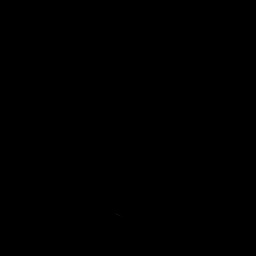
[im 8/30]
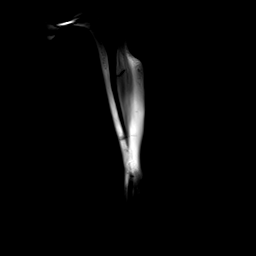
[im 15/30]
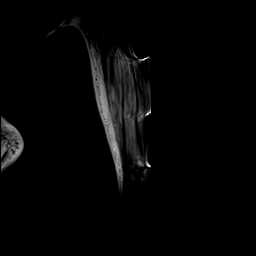
[im 22/30]
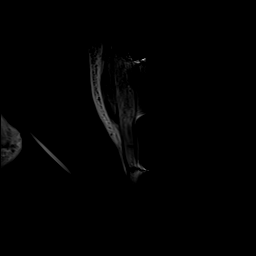
[im 30/30]
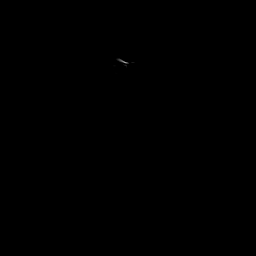

[Series 7: T1 · axial · left · 5.0mm · 1.35mm/px · z∈[-211,+226]mm · 13 of 73 slices shown (3 of 3)]
[im 1/73]
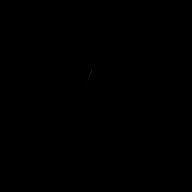
[im 7/73]
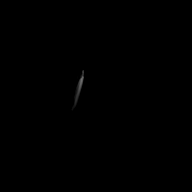
[im 13/73]
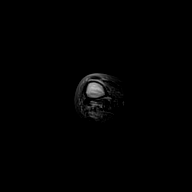
[im 19/73]
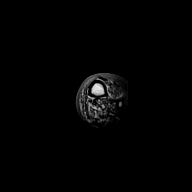
[im 25/73]
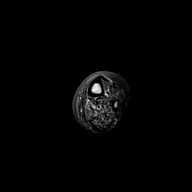
[im 31/73]
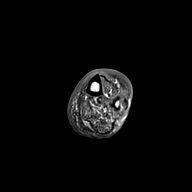
[im 37/73]
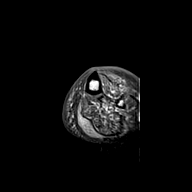
[im 43/73]
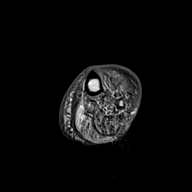
[im 49/73]
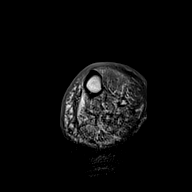
[im 55/73]
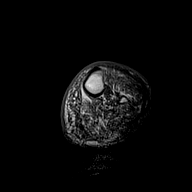
[im 61/73]
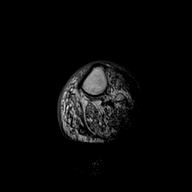
[im 67/73]
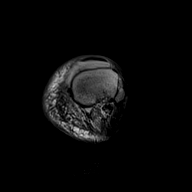
[im 73/73]
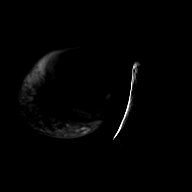

[28 of 40 positions shown; findings below may reference images not displayed]

FINDINGS: Bones/Joint/Cartilage

Four sequences are provided as the patient could not tolerate
further scanning. There is some motion on the study. No marrow
signal abnormality suggest osteomyelitis. No fracture or focal
lesion.

Ligaments

Negative.

Muscles and Tendons

No intramuscular fluid collection is seen. Lower leg musculature is
atrophic.

Soft tissues

Diffuse subcutaneous edema is present. No focal fluid collection is
identified although evaluation for abscess is limited on this study.
IMPRESSION: Negative for osteomyelitis or myositis.

Diffuse subcutaneous edema about the lower leg compatible with
cellulitis or dependent change. No subcutaneous abscess is
identified but evaluation is limited due to motion and early
termination of the exam.

## 2020-07-24 IMAGING — MR MR HEAD W/O CM
9 of 11 series · 37 of 48 positions shown · non-contrast
Comparison: None.

CLINICAL DATA: Encephalopathy

EXAM:
MRI HEAD WITHOUT CONTRAST
TECHNIQUE: Multiplanar, multiecho pulse sequences of the brain and surrounding
structures were obtained without intravenous contrast.

[Series 2: DWI · axial · 3.0mm · 0.94mm/px · z∈[-73,+73]mm · 11 of 100 slices shown (1 of 2)]
[im 1/100]
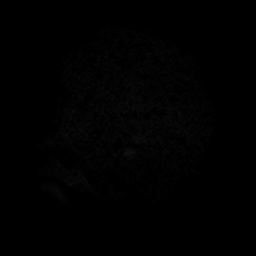
[im 10/100]
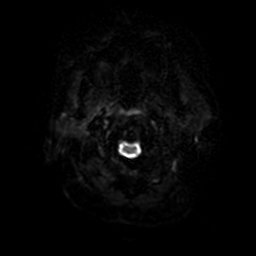
[im 20/100]
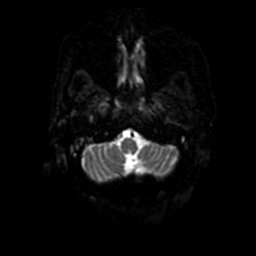
[im 30/100]
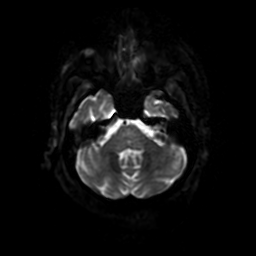
[im 40/100]
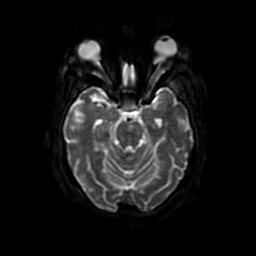
[im 50/100]
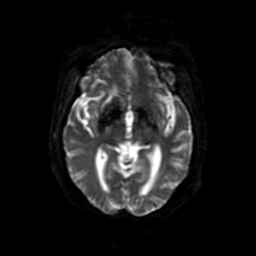
[im 60/100]
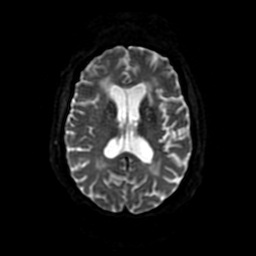
[im 70/100]
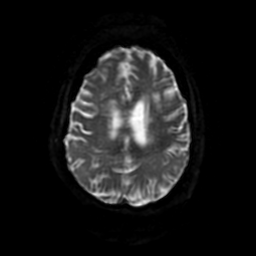
[im 80/100]
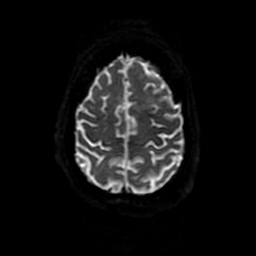
[im 90/100]
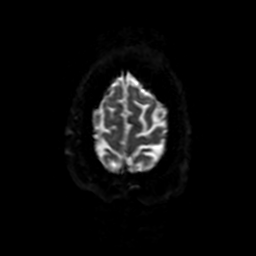
[im 100/100]
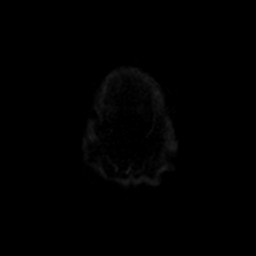

[Series 3: DWI · coronal · 4.0mm · 0.94mm/px · 7 of 70 slices shown (2 of 2)]
[im 1/70]
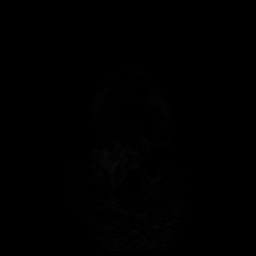
[im 12/70]
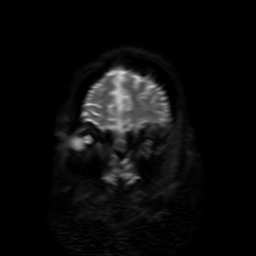
[im 24/70]
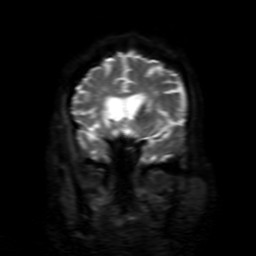
[im 35/70]
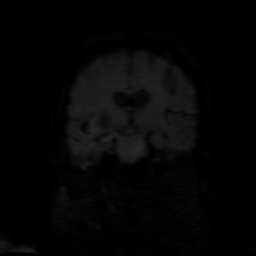
[im 47/70]
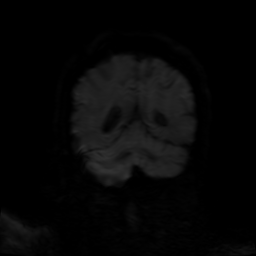
[im 58/70]
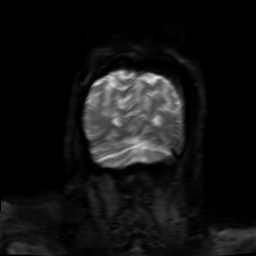
[im 70/70]
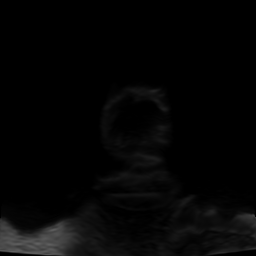

[Series 5: FLAIR post-contrast · sagittal · 5.0mm · 0.47mm/px · 2 of 23 slices shown]
[im 1/23]
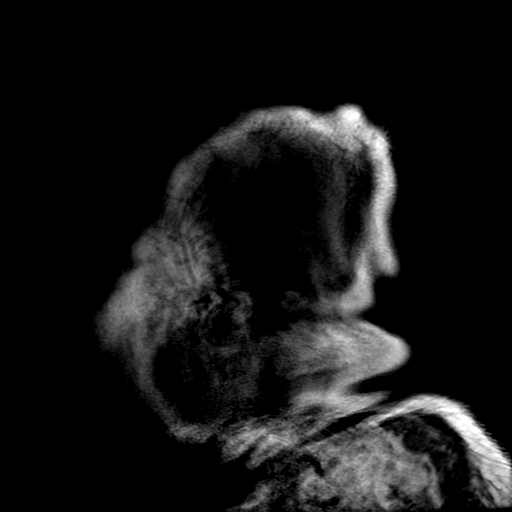
[im 23/23]
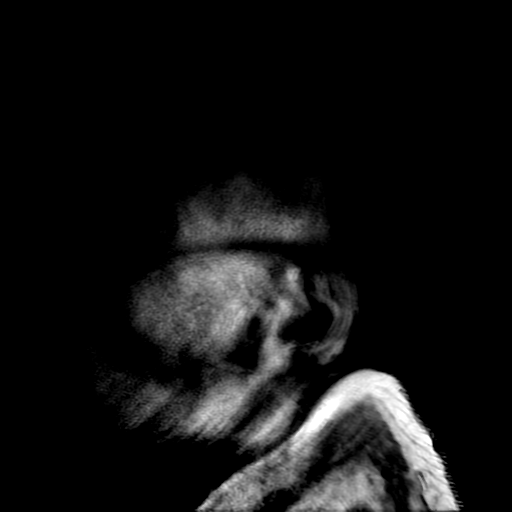

[Series 6: T2 · axial · 5.0mm · 0.47mm/px · z∈[-73,+64]mm · 2 of 24 slices shown (1 of 2)]
[im 1/24]
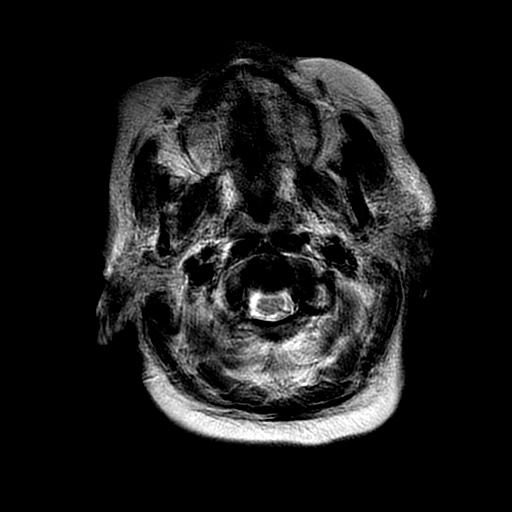
[im 24/24]
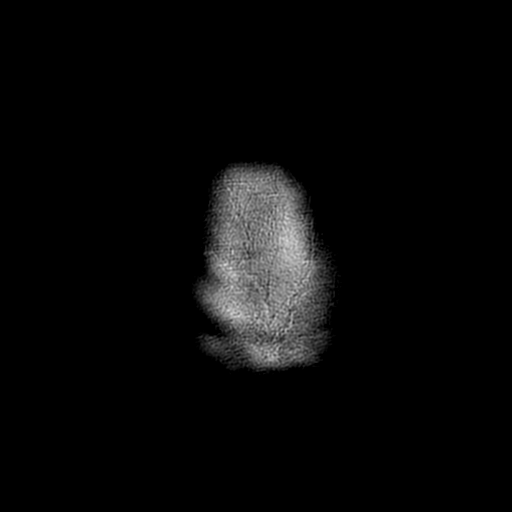

[Series 7: FLAIR · axial · 5.0mm · 0.47mm/px · z∈[-77,+60]mm · 2 of 24 slices shown]
[im 1/24]
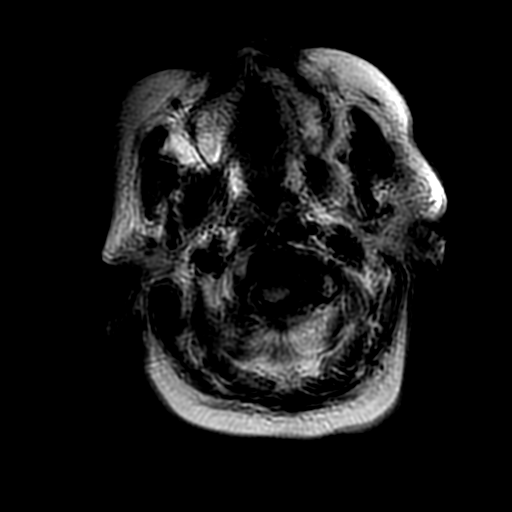
[im 24/24]
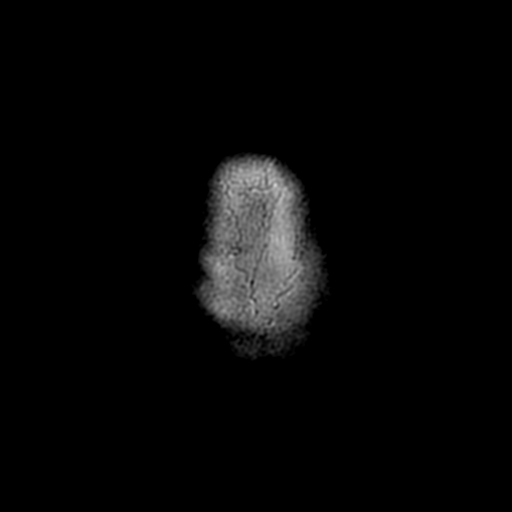

[Series 9: T2 · coronal · 5.0mm · 0.47mm/px · 3 of 29 slices shown (2 of 2)]
[im 1/29]
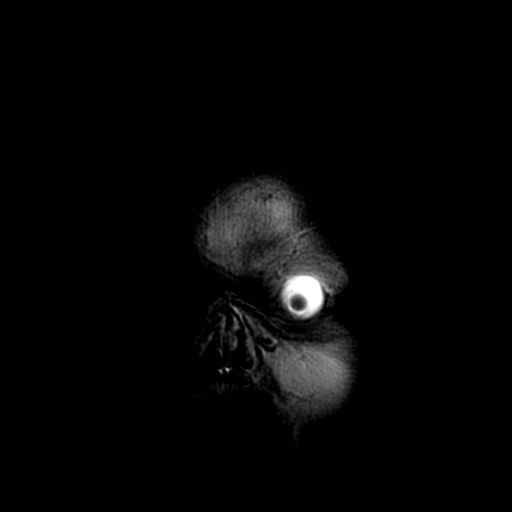
[im 15/29]
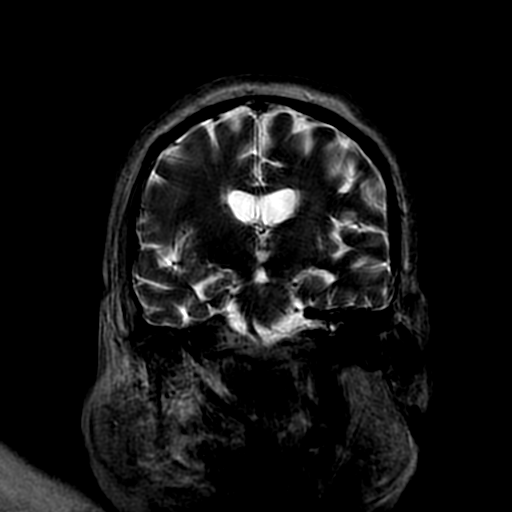
[im 29/29]
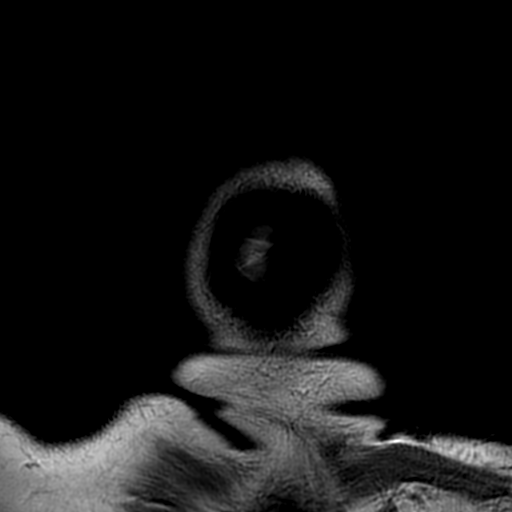

[Series 10: GRE · axial · 5.0mm · 0.43mm/px · z∈[-66,+65]mm · 2 of 25 slices shown]
[im 1/25]
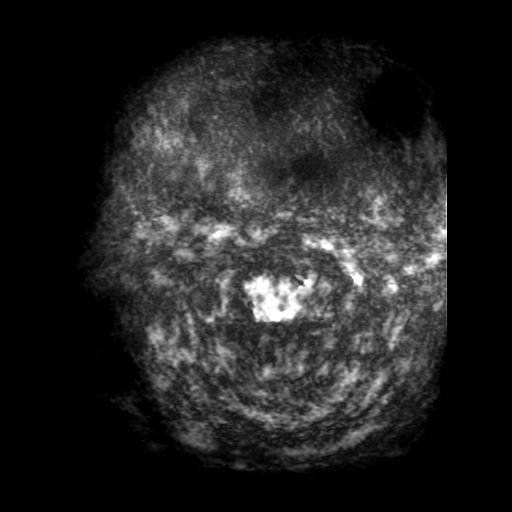
[im 25/25]
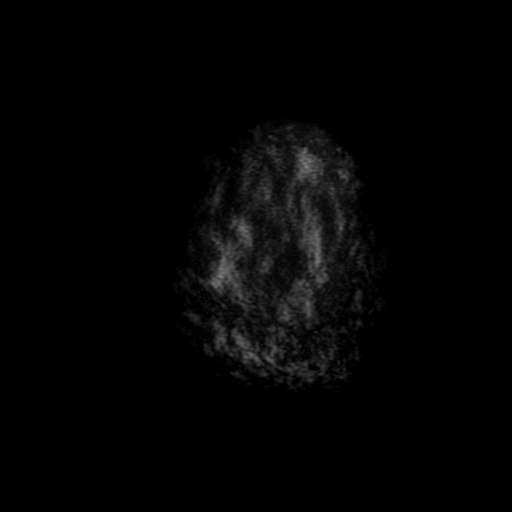

[Series 250: ADC · axial · 3.0mm · 0.94mm/px · z∈[-73,+73]mm · 5 of 50 slices shown (1 of 2)]
[im 1/50]
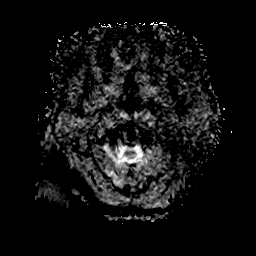
[im 13/50]
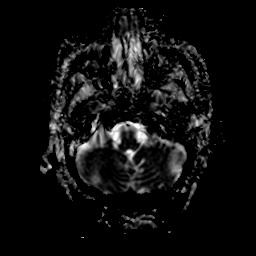
[im 25/50]
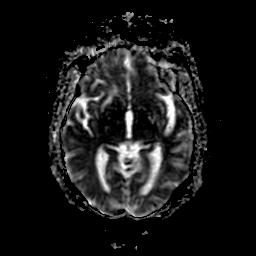
[im 37/50]
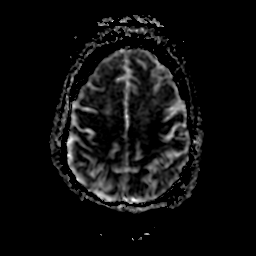
[im 50/50]
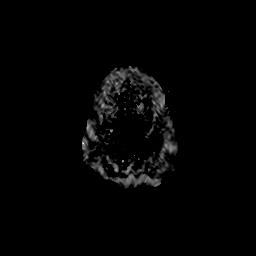

[Series 350: ADC · coronal · 4.0mm · 0.94mm/px · 3 of 35 slices shown (2 of 2)]
[im 1/35]
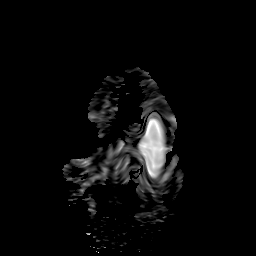
[im 18/35]
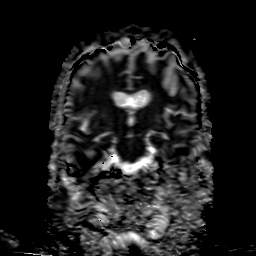
[im 35/35]
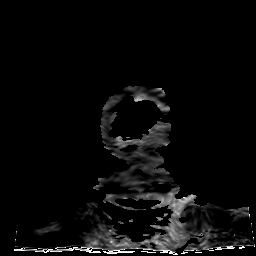

[37 of 48 positions shown; findings below may reference images not displayed]

FINDINGS: Brain: No acute infarct, acute hemorrhage or extra-axial collection.
Early confluent hyperintense T2-weighted signal of the
periventricular and deep white matter, most commonly due to chronic
ischemic microangiopathy. Normal volume of CSF spaces. No chronic
microhemorrhage. Normal midline structures.

Vascular: Normal flow voids.

Skull and upper cervical spine: Normal marrow signal.

Sinuses/Orbits: Small amount of mastoid fluid bilaterally. No
paranasal sinus fluid level. Orbits are normal.

Other: None
IMPRESSION: 1. No acute intracranial abnormality.
2. Findings of chronic ischemic microangiopathy.

## 2020-07-24 IMAGING — DX DG CHEST 1V PORT
1 series · 1 of 1 positions shown · non-contrast
Comparison: 06/28/2019

CLINICAL DATA: Dyspnea

EXAM:
PORTABLE CHEST 1 VIEW

[chest ap]
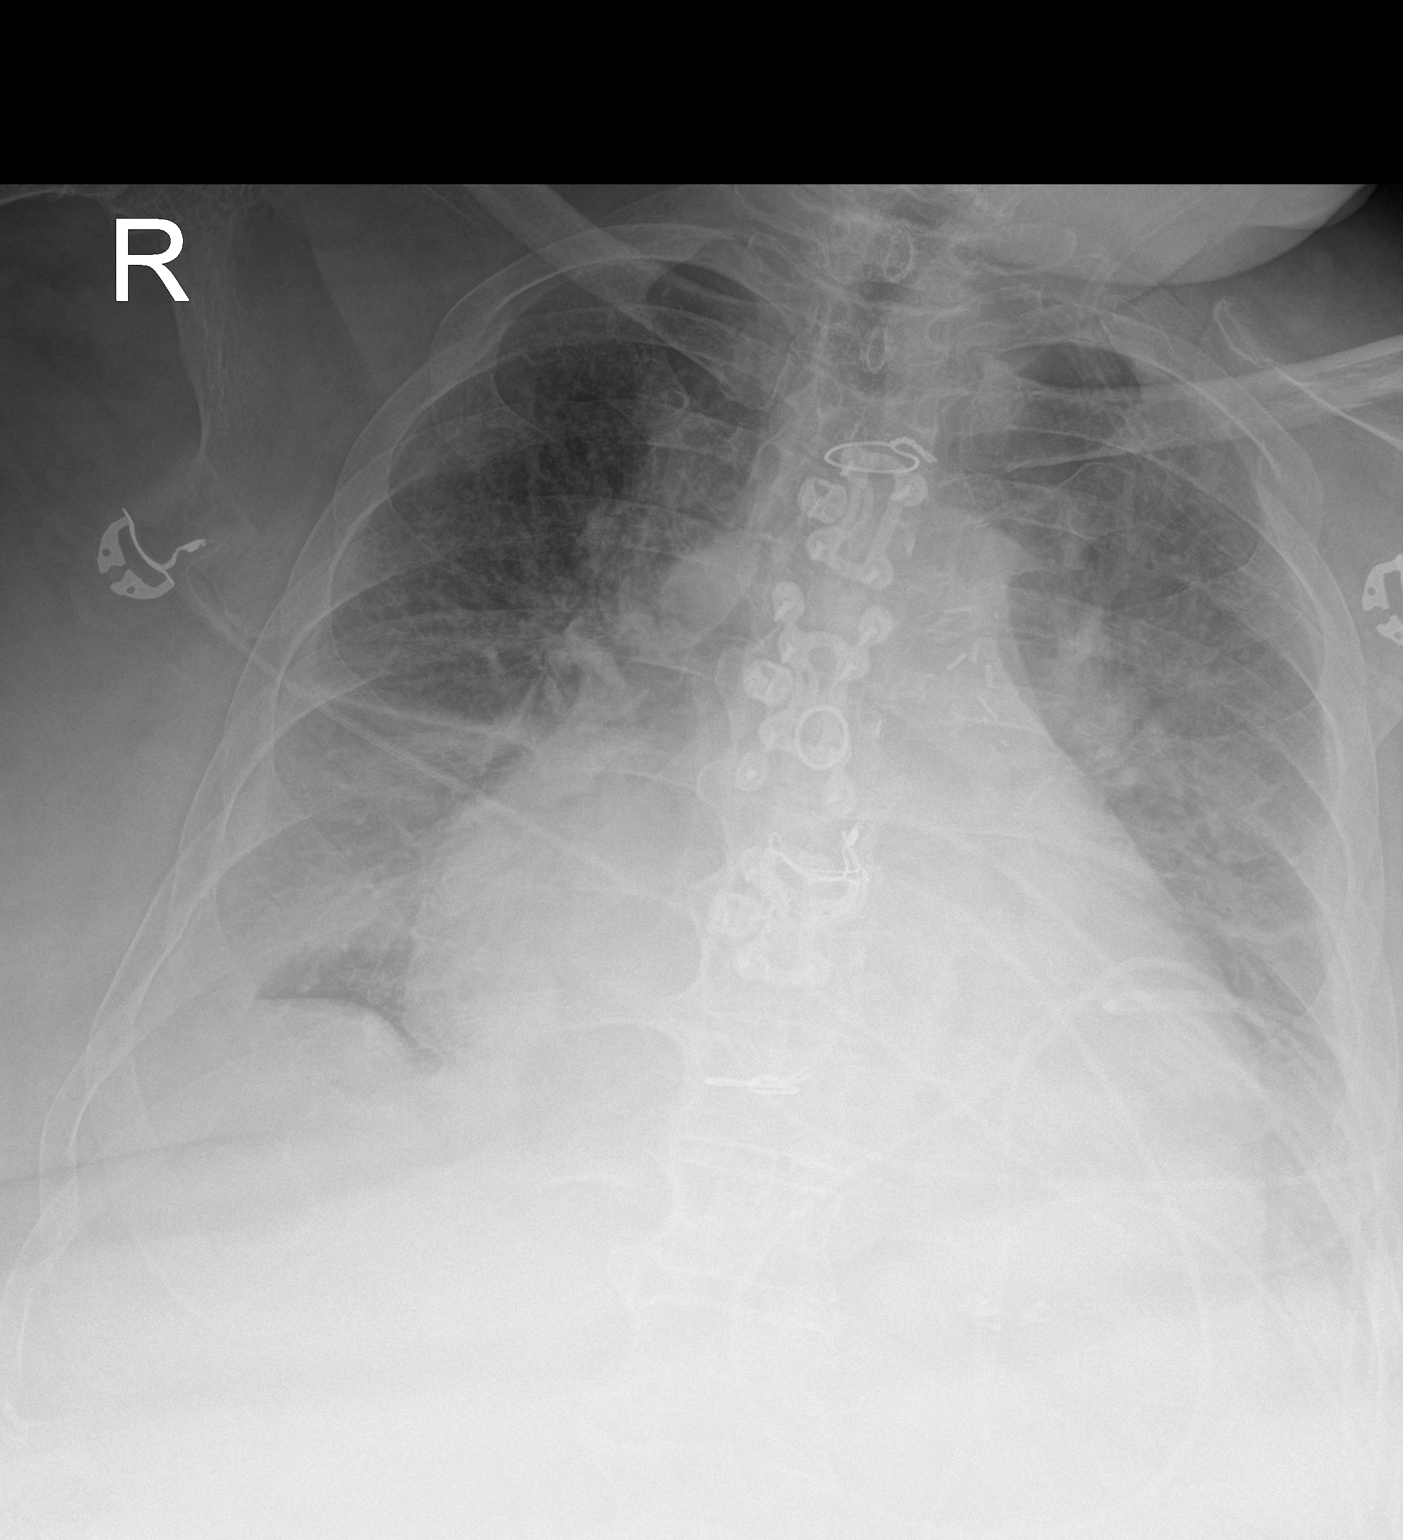

[1 of 1 positions shown; findings below may reference images not displayed]

FINDINGS: Cardiac shadow remains enlarged. Postsurgical changes are again
seen. Increasing vascular congestion is noted when compare with the
prior exam with increasing edema. Some patchy airspace opacity is
noted in the right base consistent with atelectasis. No bony
abnormality is noted.
IMPRESSION: Increasing CHF with right basilar atelectasis.

## 2020-07-28 IMAGING — CT CT HEAD W/O CM
3 of 4 series · 13 of 47 positions shown, 15 images · non-contrast
Comparison: 06/27/2019.  06/30/2019.

CLINICAL DATA: Encephalopathy.

EXAM:
CT HEAD WITHOUT CONTRAST
TECHNIQUE: Contiguous axial images were obtained from the base of the skull
through the vertex without intravenous contrast.

[Series 3: head without · axial · non-contrast · 0.41mm/px · z∈[-146,-16]mm · 7 of 36 slices shown, 9 images]
[im 5/36  brain]
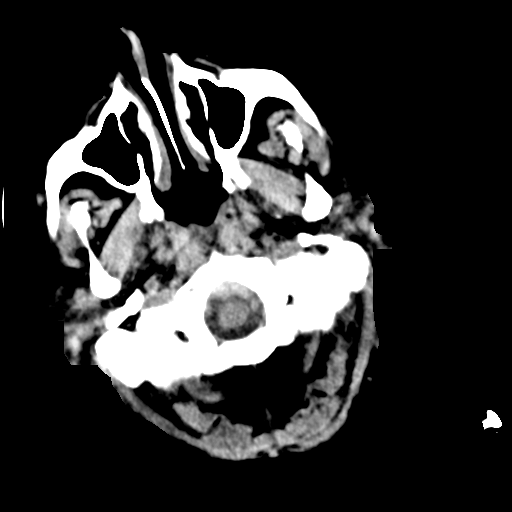
[im 5/36  bone]
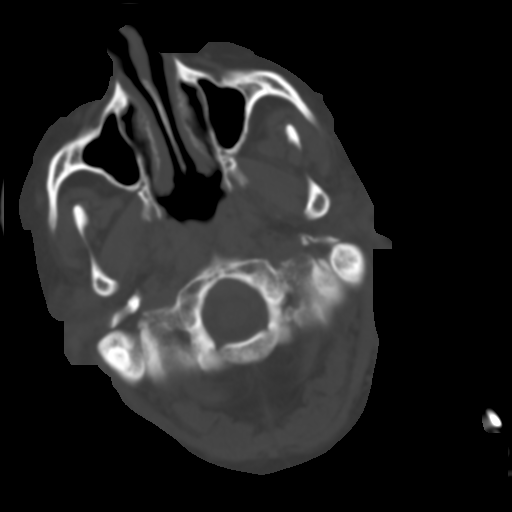
[im 9/36  brain]
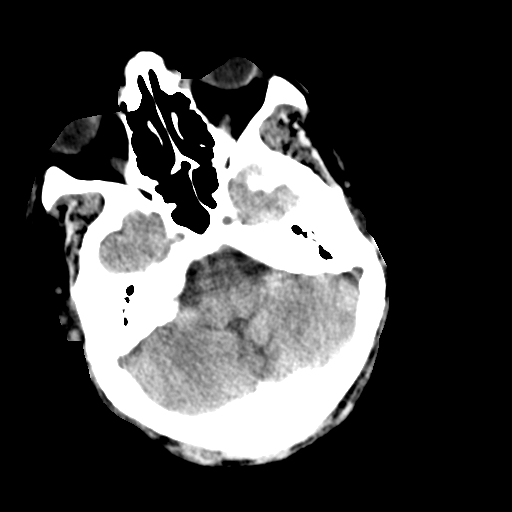
[im 14/36  brain]
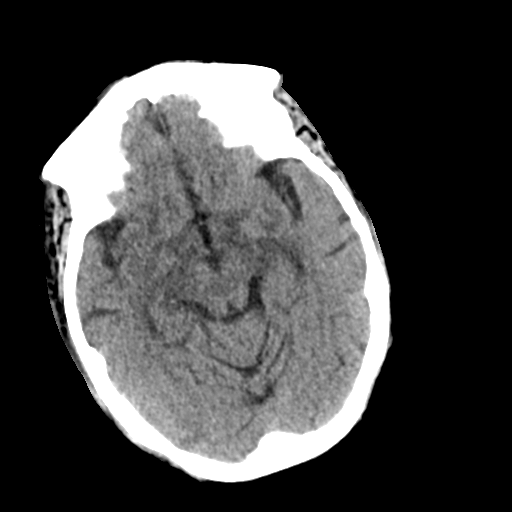
[im 18/36  brain]
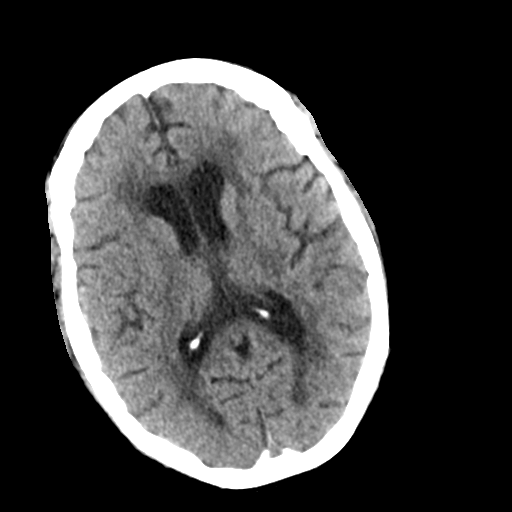
[im 22/36  brain]
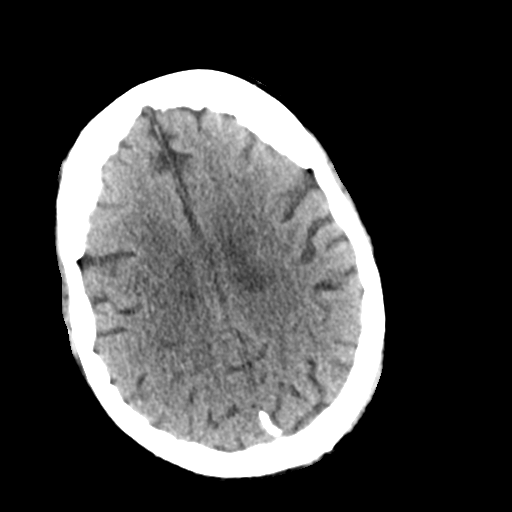
[im 22/36  bone]
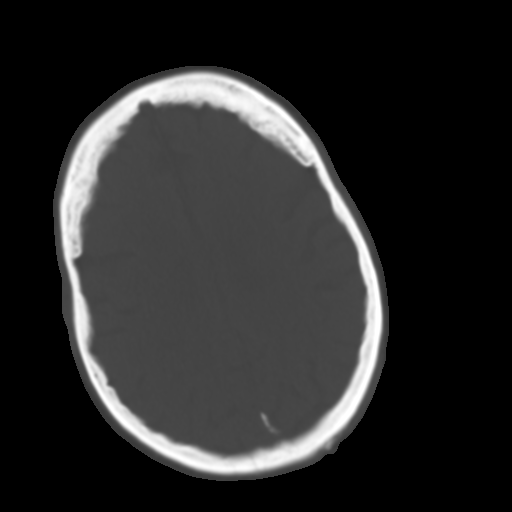
[im 27/36  brain]
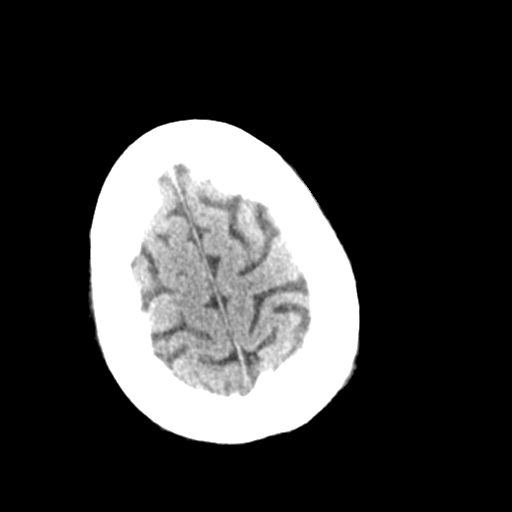
[im 31/36  brain]
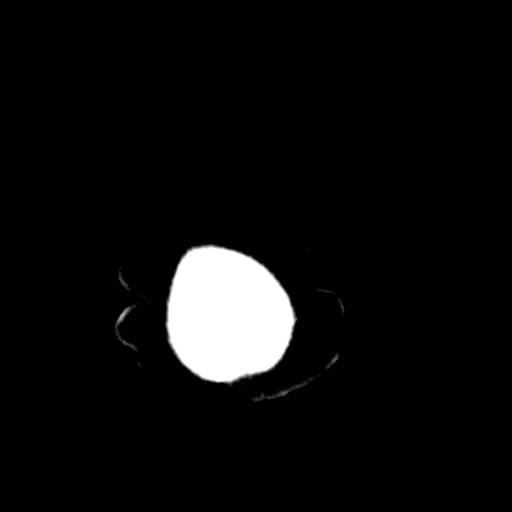

[Series 5: head without cor · coronal · non-contrast · 0.35mm/px · 3 of 79 slices shown]
[im 27/79  brain]
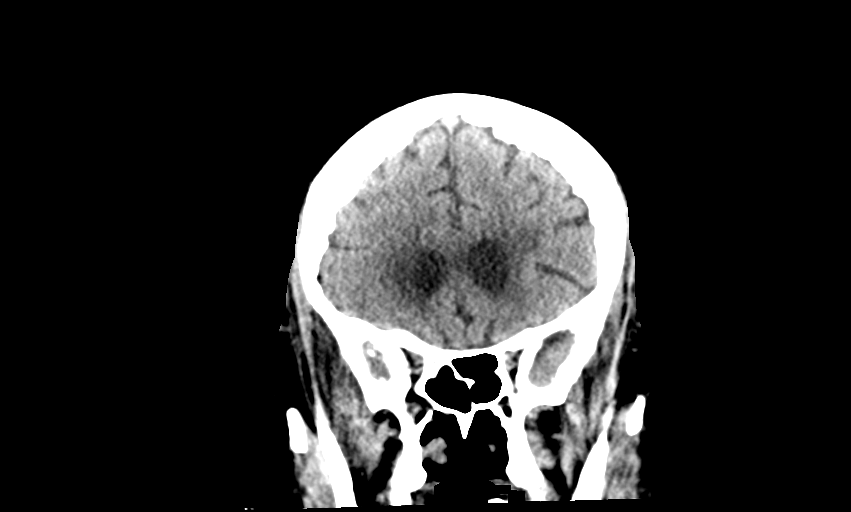
[im 35/79  brain]
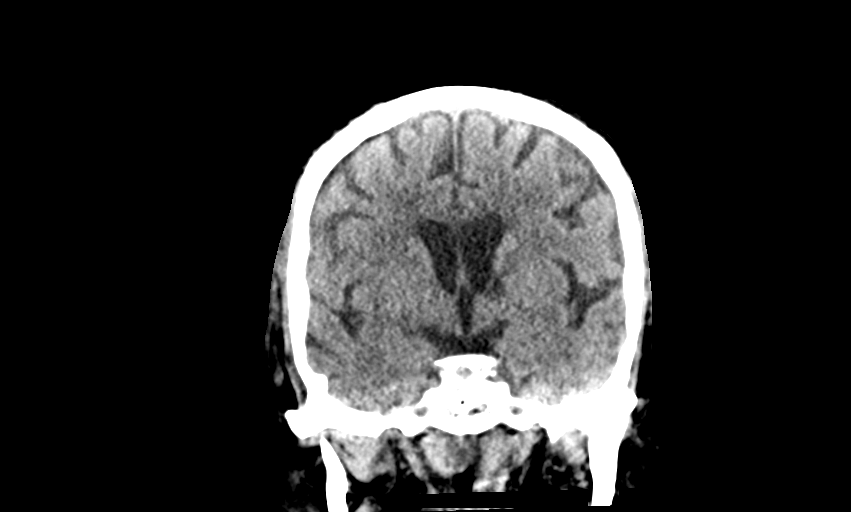
[im 44/79  brain]
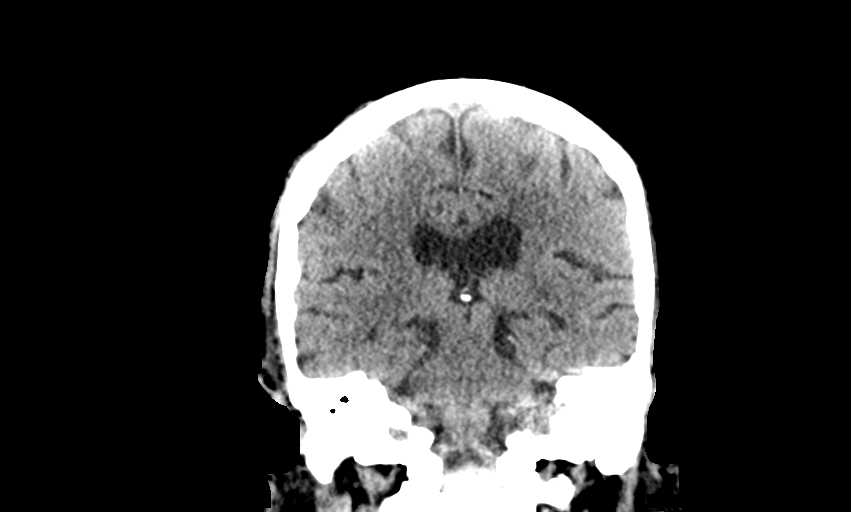

[Series 6: head without sag · sagittal · non-contrast · 0.35mm/px · 3 of 63 slices shown]
[im 21/63  brain]
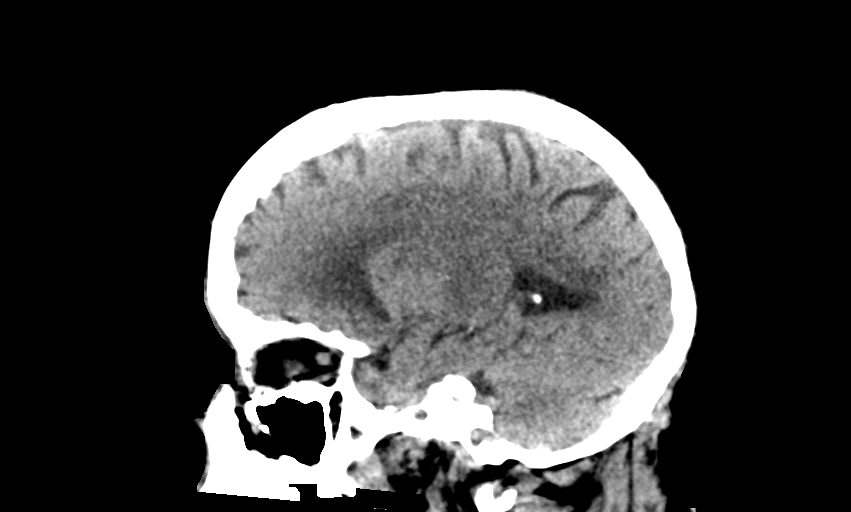
[im 32/63  brain]
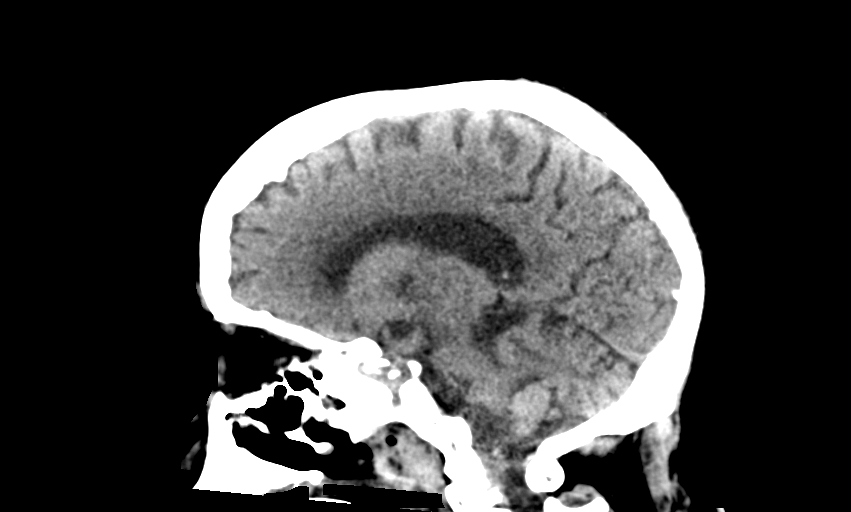
[im 42/63  brain]
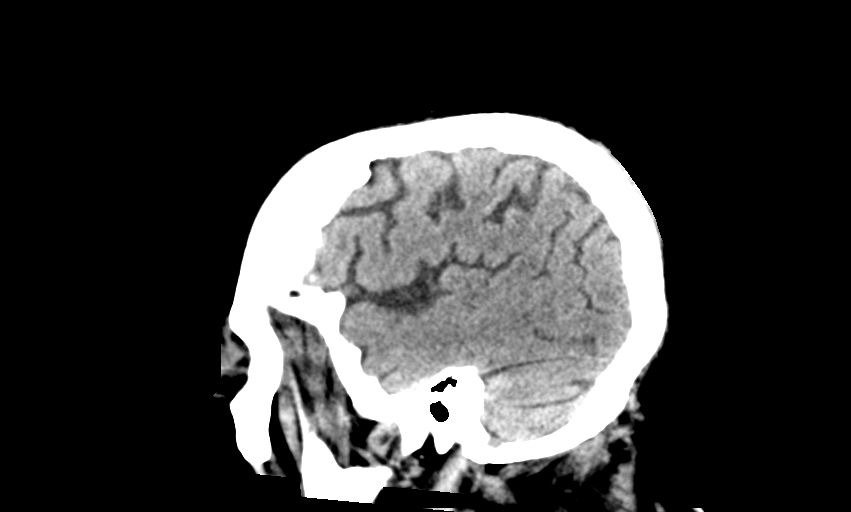

[13 of 47 positions shown; findings below may reference images not displayed]

FINDINGS: Brain: No sign of acute infarction. Chronic small-vessel ischemic
changes of the cerebral hemispheric white matter. No mass lesion,
hemorrhage, hydrocephalus or extra-axial collection.

Vascular: There is atherosclerotic calcification of the major
vessels at the base of the brain.

Skull: Negative

Sinuses/Orbits: Clear/normal

Other: None
IMPRESSION: No acute finding. Chronic small-vessel ischemic changes of the
hemispheric white matter as seen previously.

## 2020-07-28 IMAGING — DX DG CHEST 1V PORT
1 series · 1 of 1 positions shown · non-contrast
Comparison: 06/30/2019

CLINICAL DATA: Intubated

EXAM:
PORTABLE CHEST 1 VIEW

[chest]
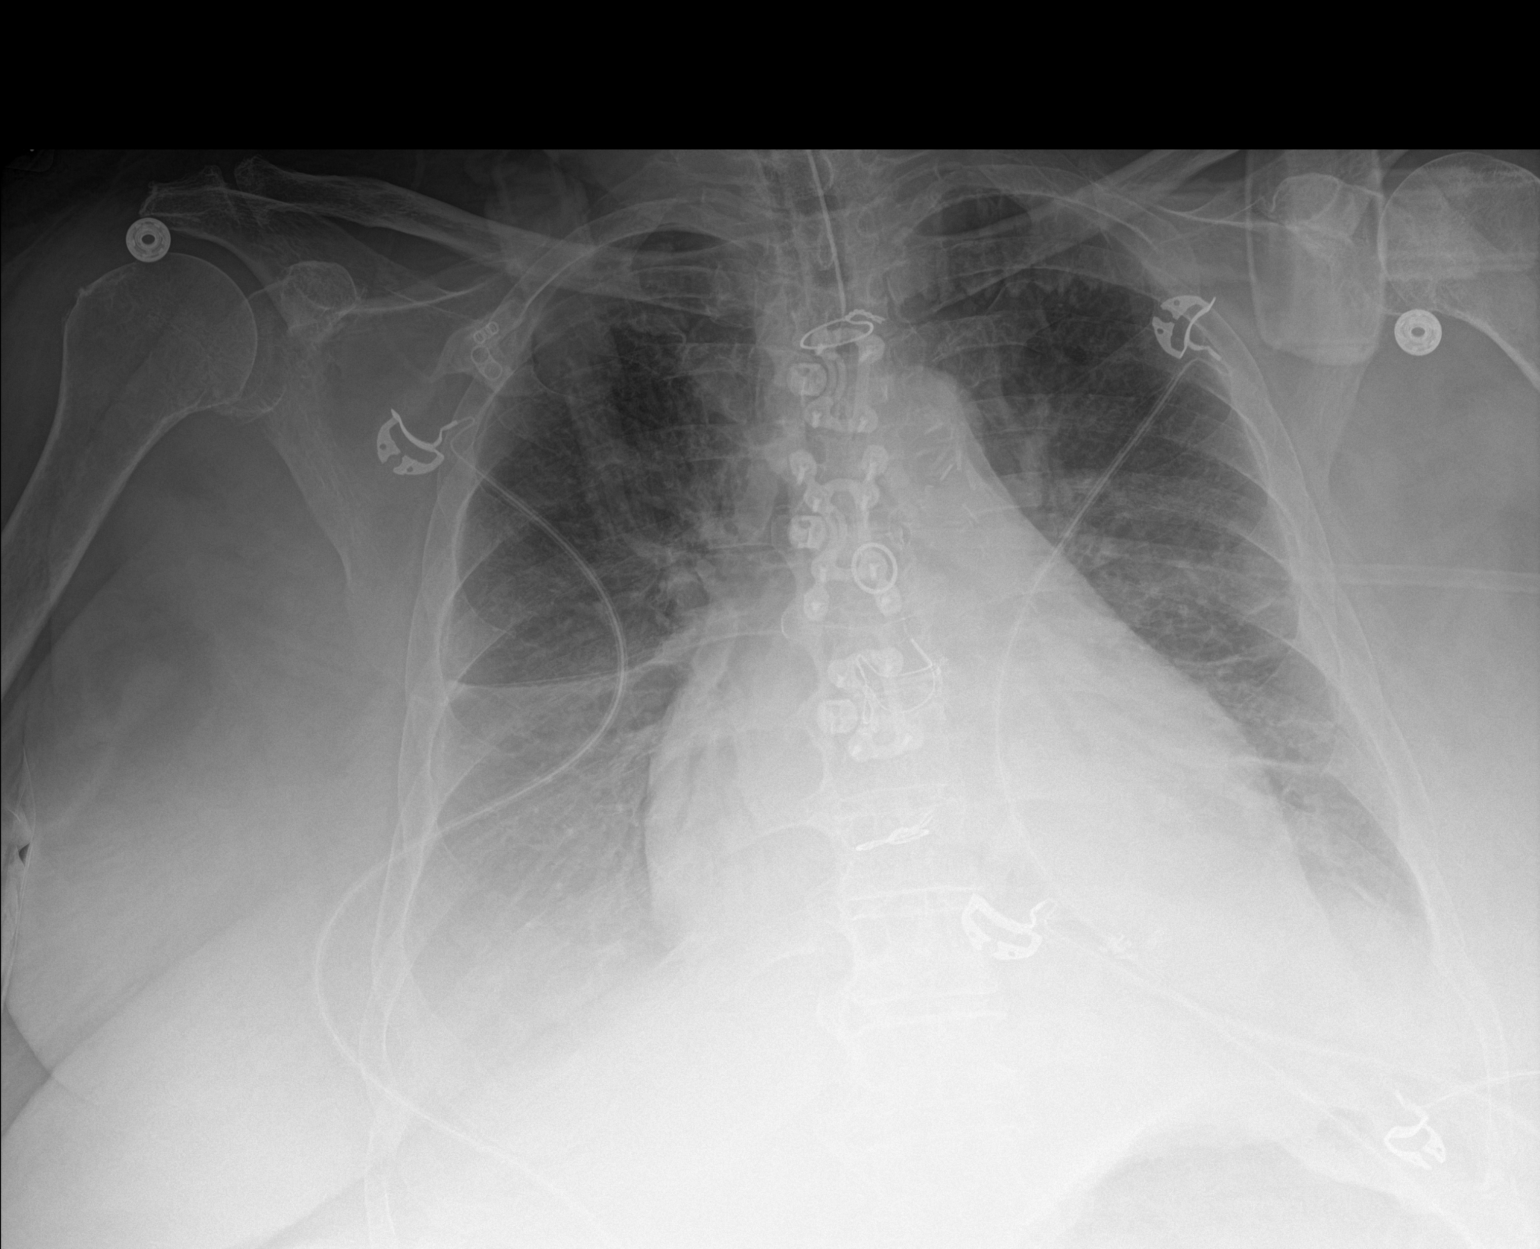

[1 of 1 positions shown; findings below may reference images not displayed]

FINDINGS: Endotracheal tube tip is 5-6 cm above the carina. There is
cardiomegaly. Previous aortic valve replacement. Mild pulmonary
edema persists with bilateral effusions and atelectasis in the lower
lungs.
IMPRESSION: Endotracheal tube tip 5-6 cm above the carina. Persistent mild edema
with bilateral effusions and lower lobe atelectasis.

## 2020-07-28 IMAGING — DX DG ABD PORTABLE 1V
1 series · 2 of 2 positions shown · non-contrast
Comparison: 11/23/2018

CLINICAL DATA: OG tube placement

EXAM:
PORTABLE ABDOMEN - 1 VIEW

[Series 1: abdomen · 0.14mm/px · 2 of 2 slices shown]
[im 1/2]
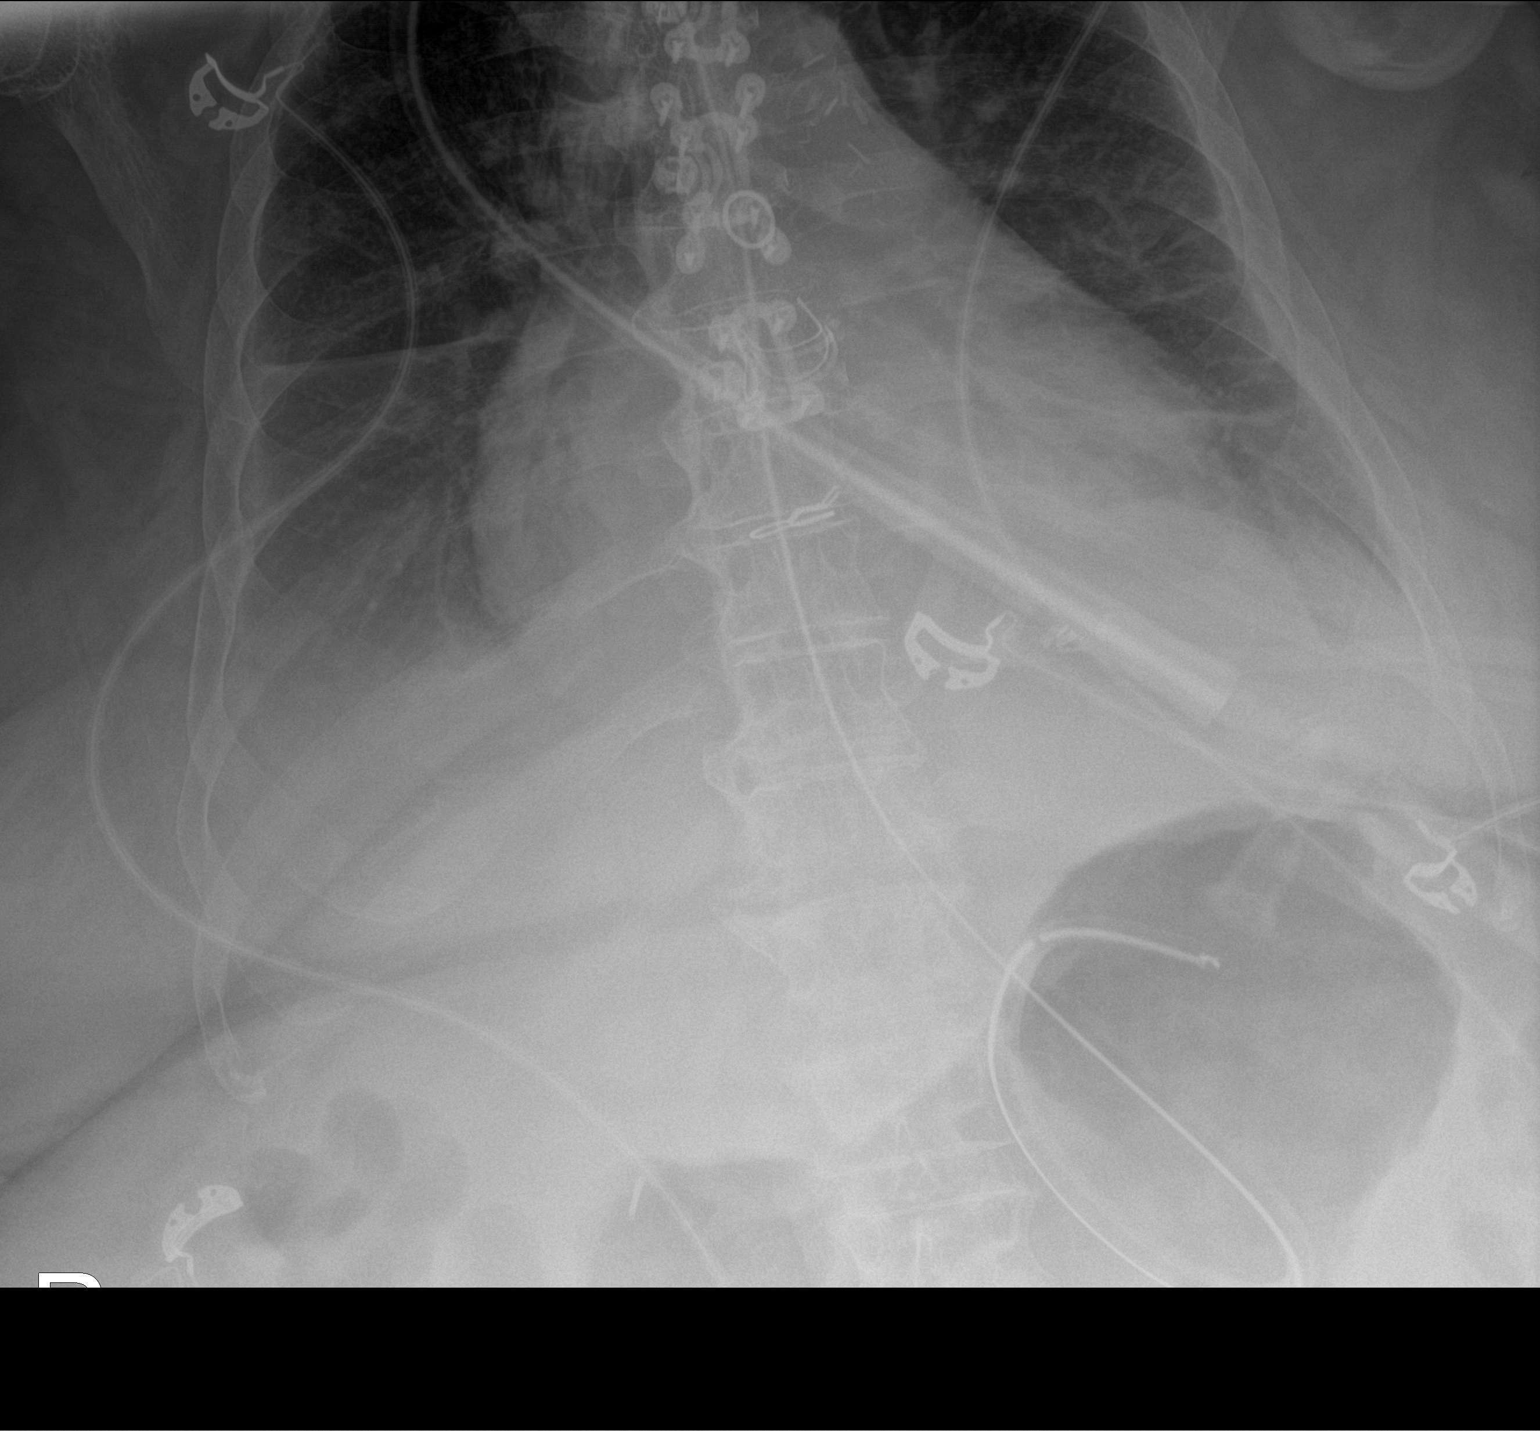
[im 2/2]
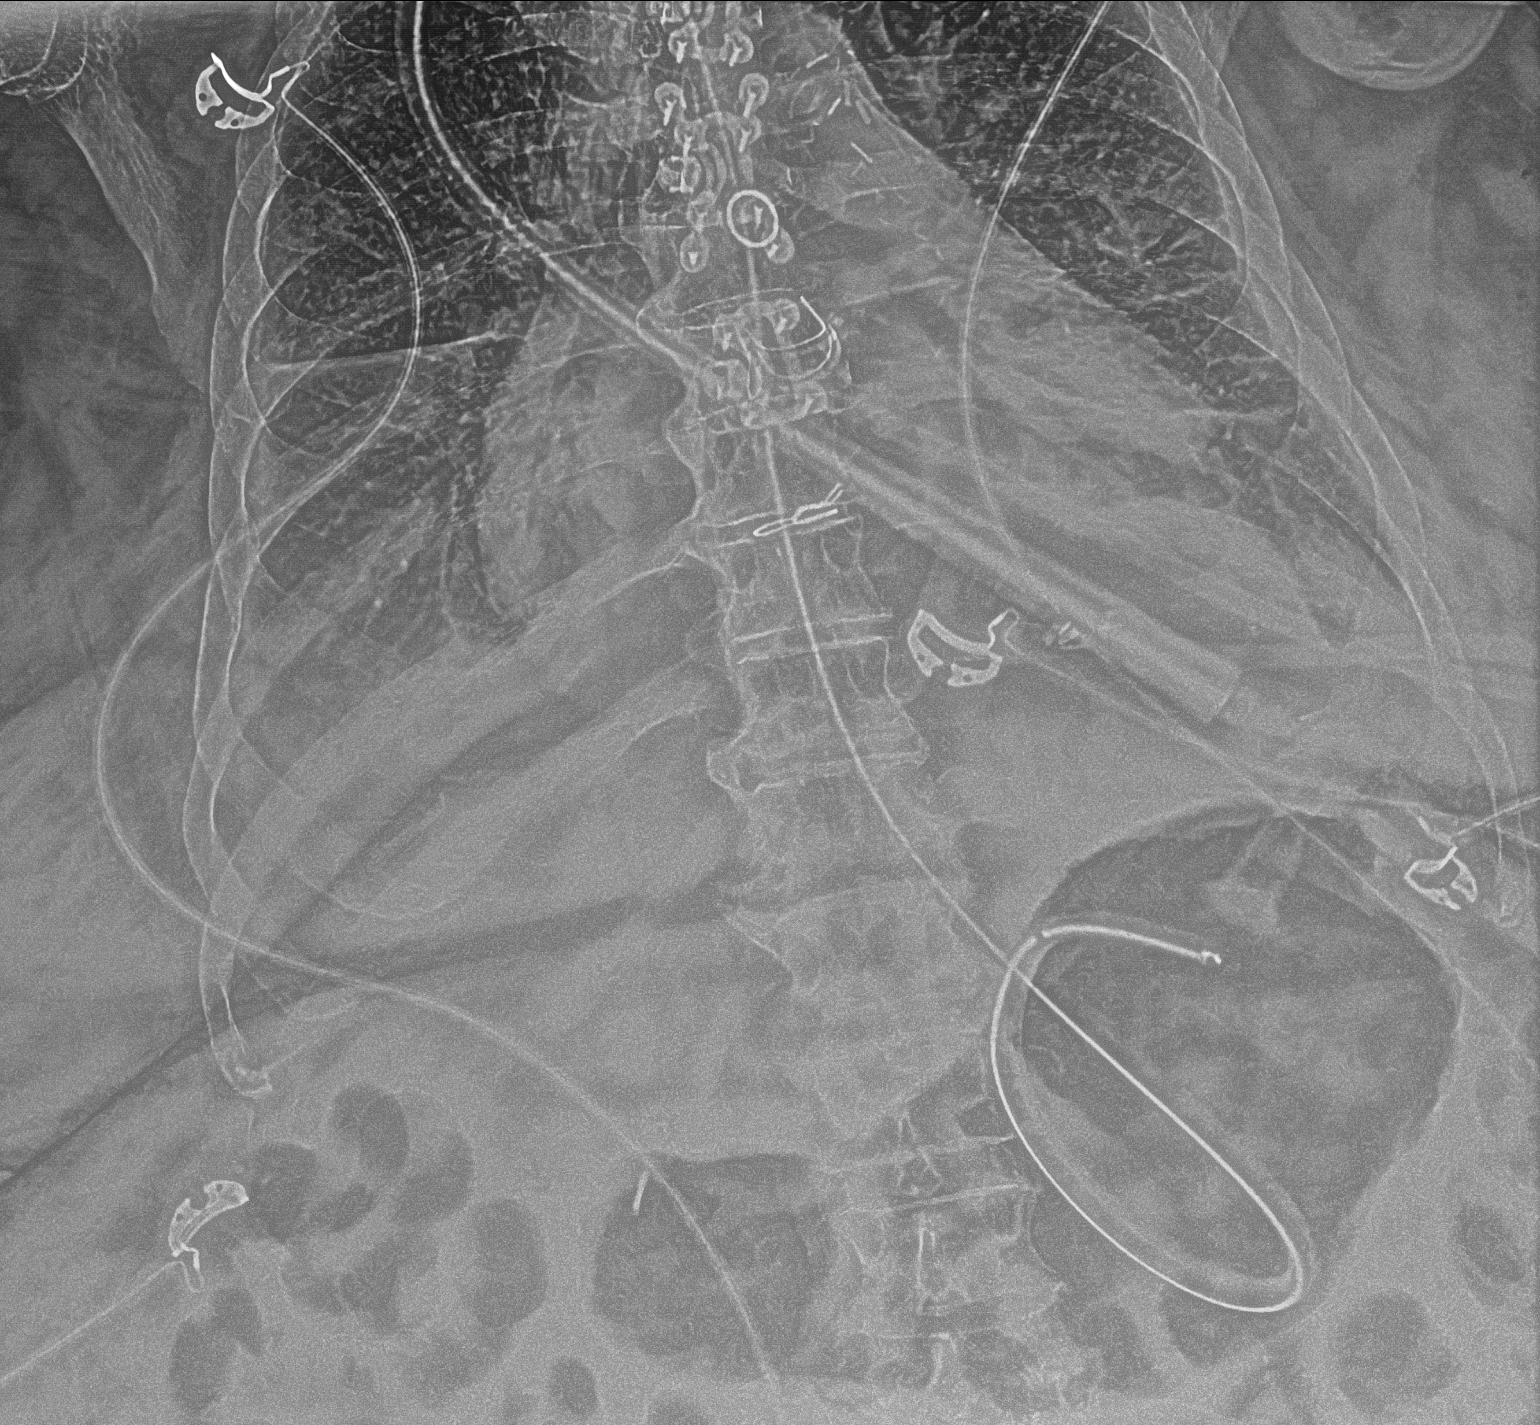

[2 of 2 positions shown; findings below may reference images not displayed]

FINDINGS: OG tube coils in the stomach. Mild gaseous distention of stomach
Peri layering bilateral effusions with bibasilar opacities.
IMPRESSION: OG tube coils in the stomach.

## 2020-07-31 IMAGING — DX DG CHEST 1V PORT
1 series · 1 of 1 positions shown · non-contrast
Comparison: July 05, 2019

CLINICAL DATA: Hypoxia

EXAM:
PORTABLE CHEST 1 VIEW

[chest ap]
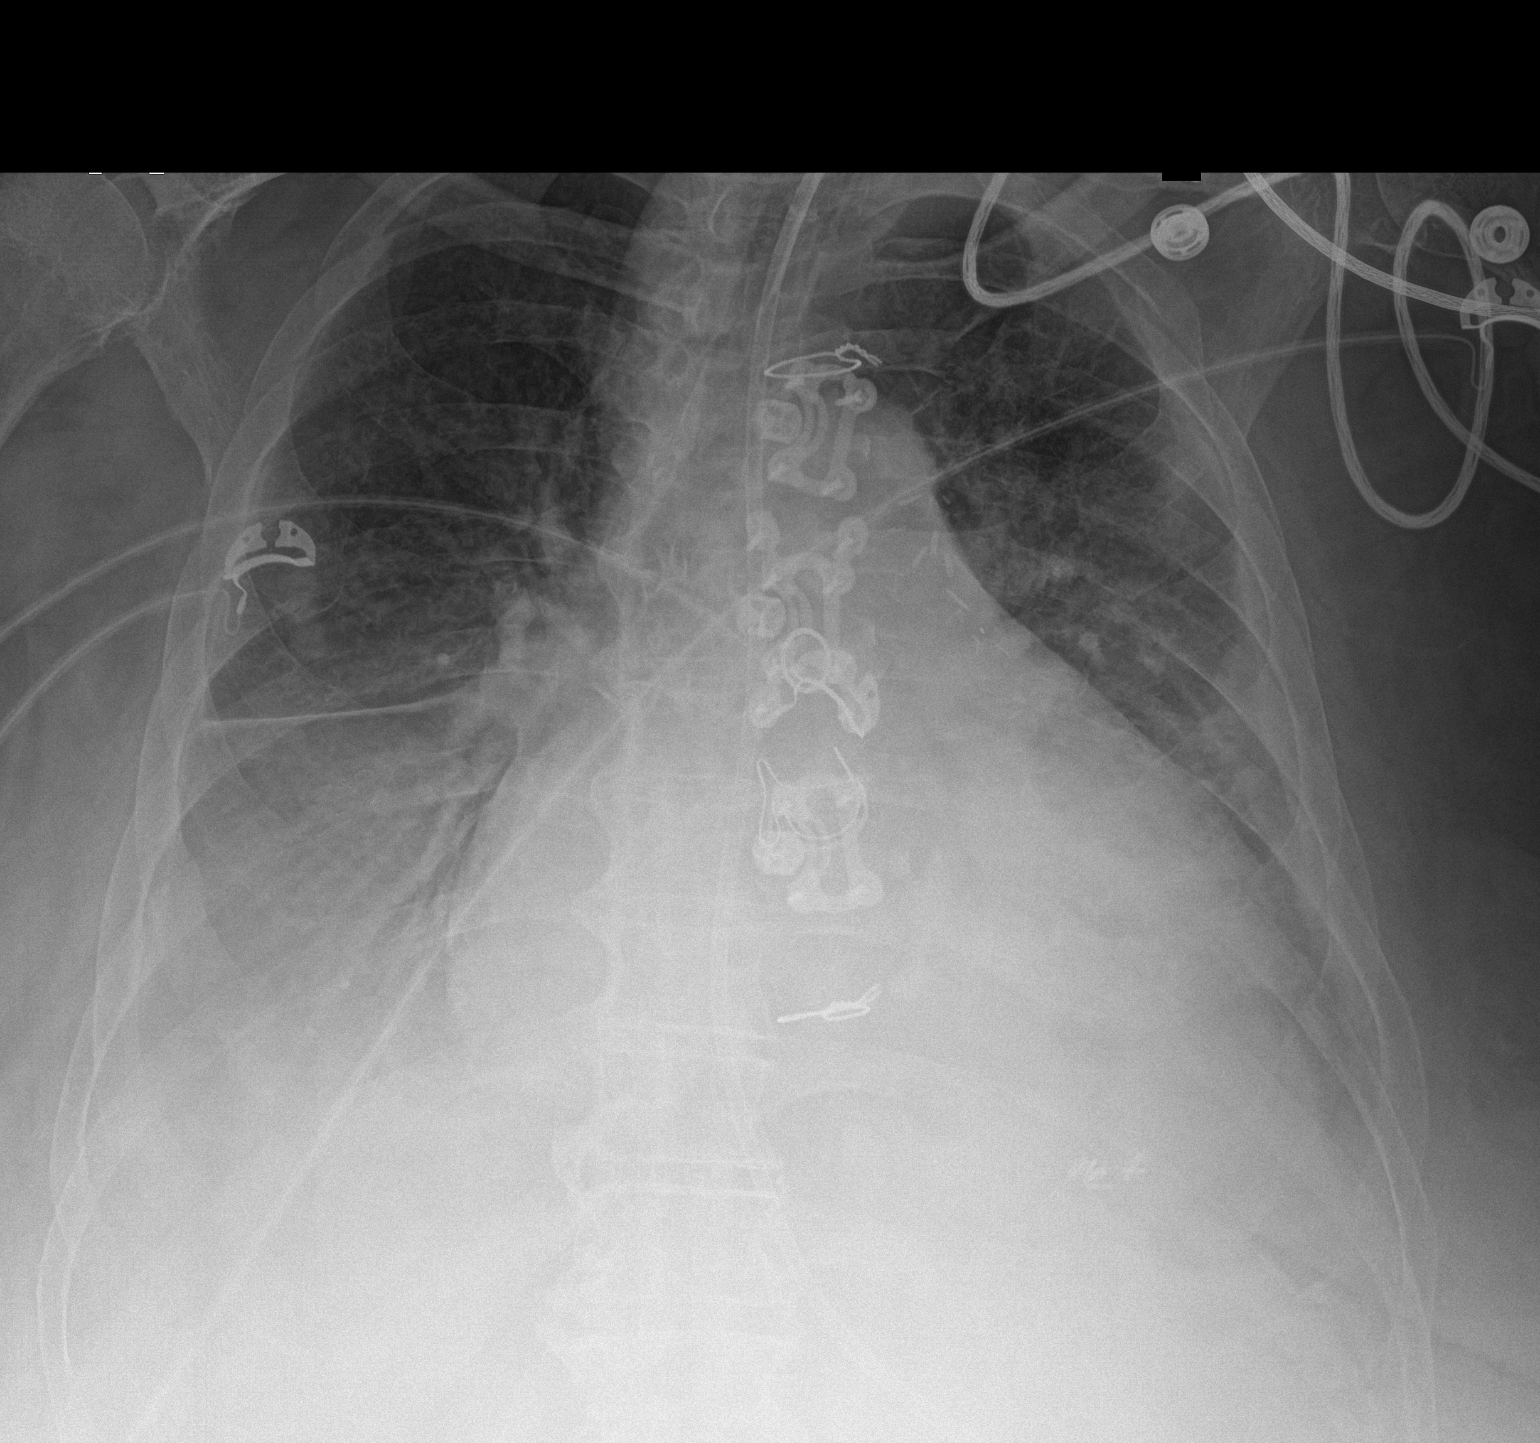

[1 of 1 positions shown; findings below may reference images not displayed]

FINDINGS: Stable cardiomegaly and mild edema. The feeding tube terminates
below today's film. No pneumothorax. A layering effusion on the
right with underlying atelectasis is stable. Opacity on the left is
also similar in the interval. No other interval changes.
IMPRESSION: 1. Findings are consistent with cardiomegaly and mild edema.
2. The layering effusion with underlying atelectasis on the right is
stable.
3. Opacity in the left base could represent a small effusion with
atelectasis. Infiltrate not completely excluded. Recommend attention
on follow-up.

## 2020-08-08 IMAGING — DX DG CHEST 1V PORT
1 series · 1 of 1 positions shown · non-contrast
Comparison: 07/07/2019

CLINICAL DATA: Altered mental status.  Chest pain.

EXAM:
PORTABLE CHEST 1 VIEW

[chest]
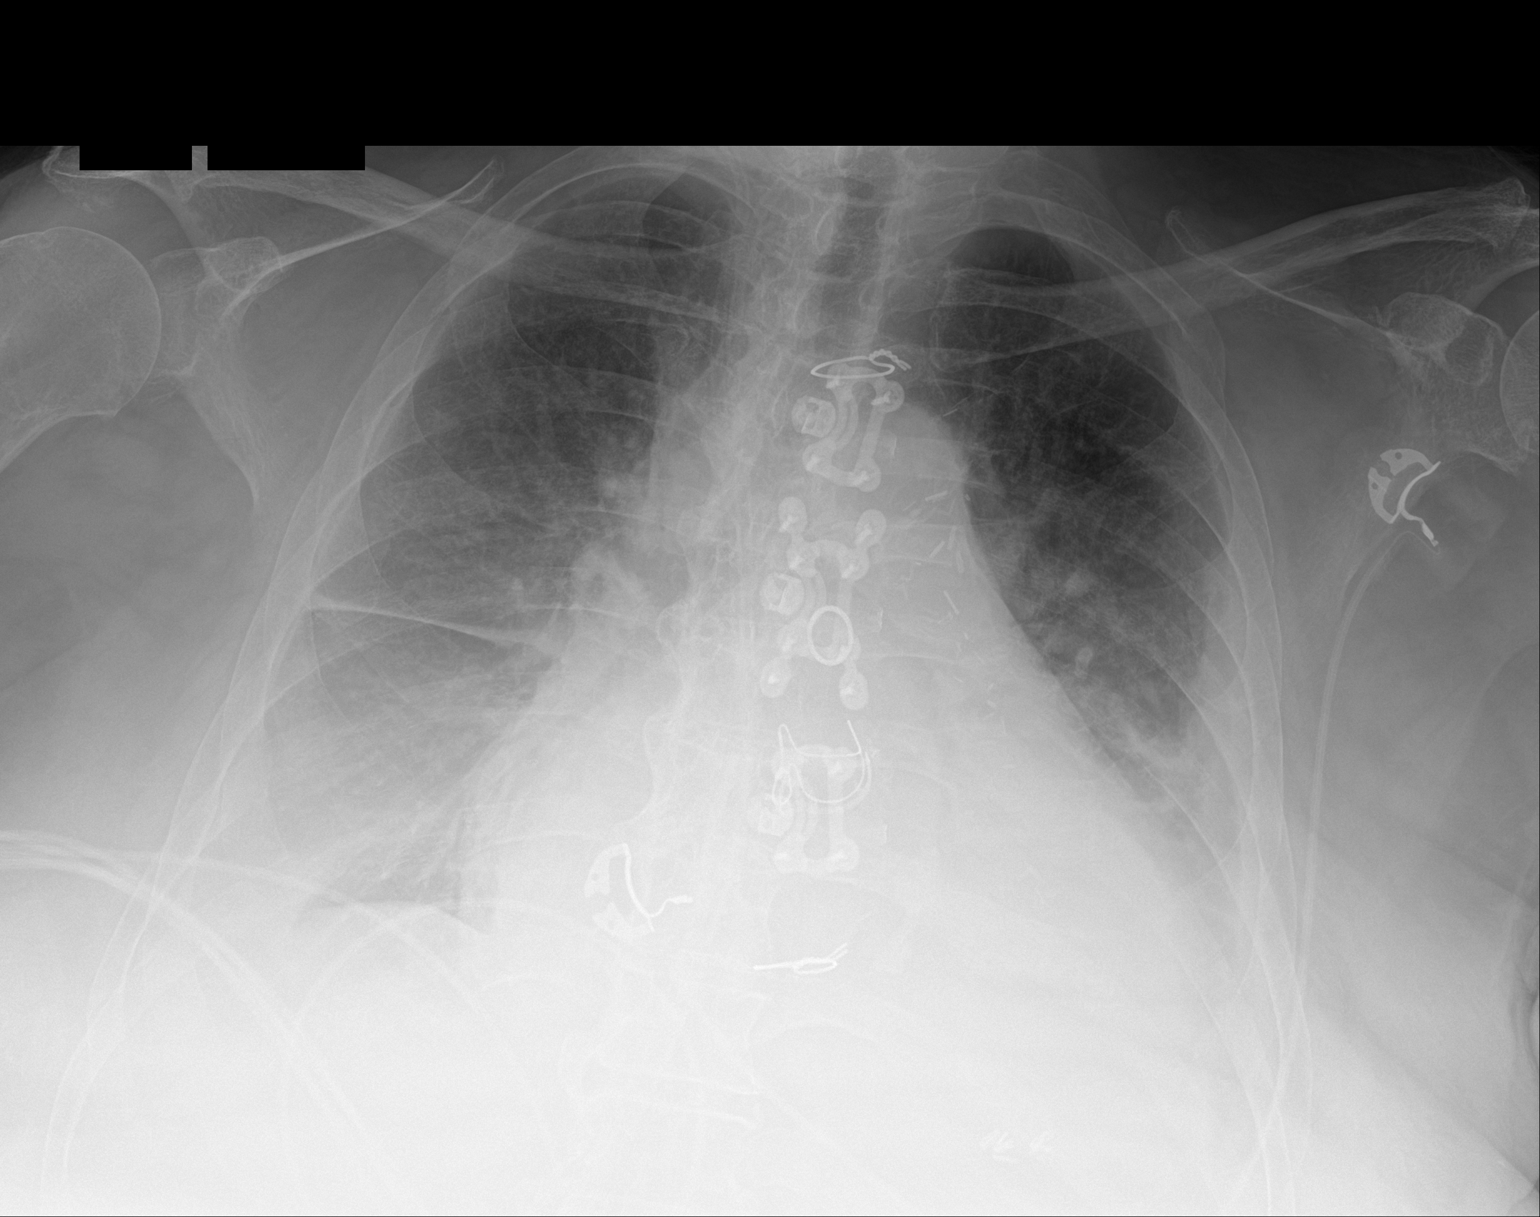

[1 of 1 positions shown; findings below may reference images not displayed]

FINDINGS: Redemonstration of cardiomegaly. Previous CABG and aortic valve
replacement. Pulmonary edema with bilateral effusions. Findings
similar to the study of 8 days ago. No new abnormality seen.
IMPRESSION: Persistent congestive heart failure pattern. Similar appearance with
edema and effusions.

## 2020-08-11 IMAGING — DX DG CHEST 1V PORT
1 series · 1 of 1 positions shown · non-contrast
Comparison: July 15, 2019.

CLINICAL DATA: Dyspnea.

EXAM:
PORTABLE CHEST 1 VIEW

[chest ap]
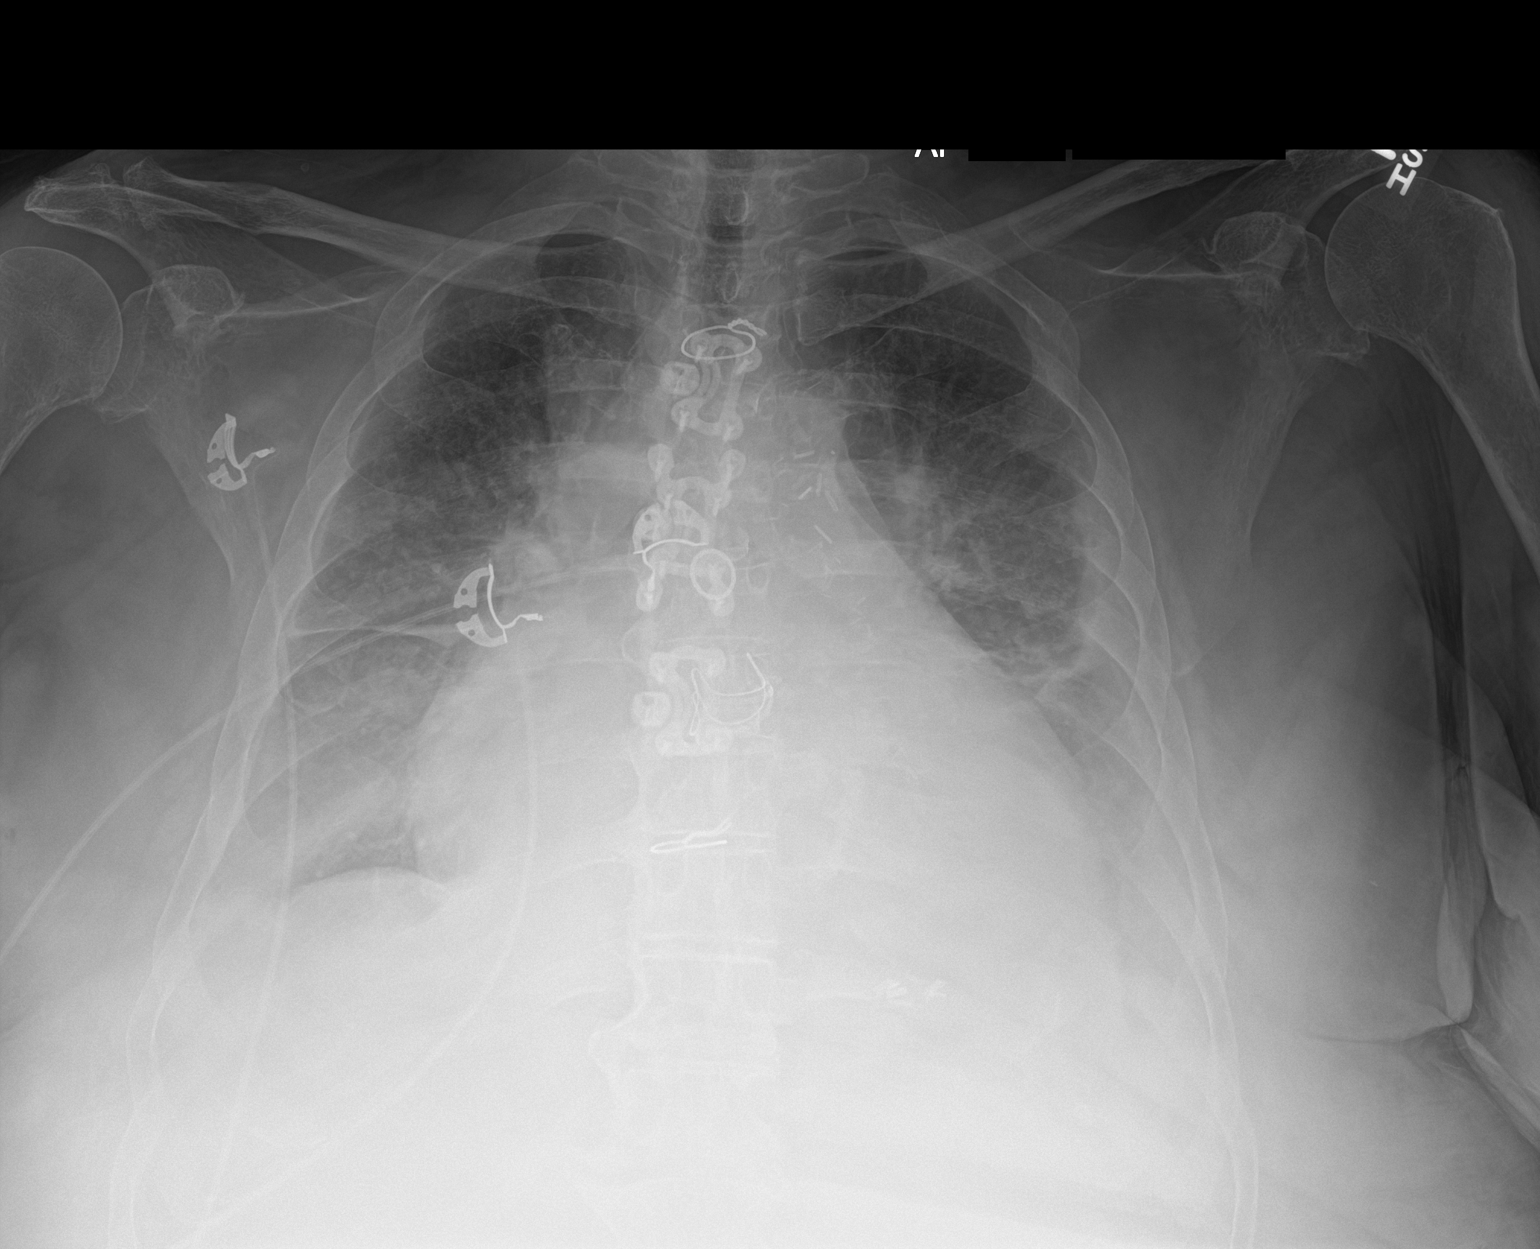

[1 of 1 positions shown; findings below may reference images not displayed]

FINDINGS: Stable cardiomegaly. Status post aortic valve repair. No
pneumothorax is noted. Mild bibasilar atelectasis or edema is noted
with probable small pleural effusions, left greater than right. Bony
thorax is unremarkable.
IMPRESSION: Mild bibasilar atelectasis or edema is noted with probable small
pleural effusions, left greater than right.

## 2020-09-13 IMAGING — DX DG CHEST 1V PORT
1 series · 1 of 1 positions shown · non-contrast
Comparison: Chest x-ray dated July 18, 2019

CLINICAL DATA: Altered mental status

EXAM:
PORTABLE CHEST 1 VIEW

[chest]
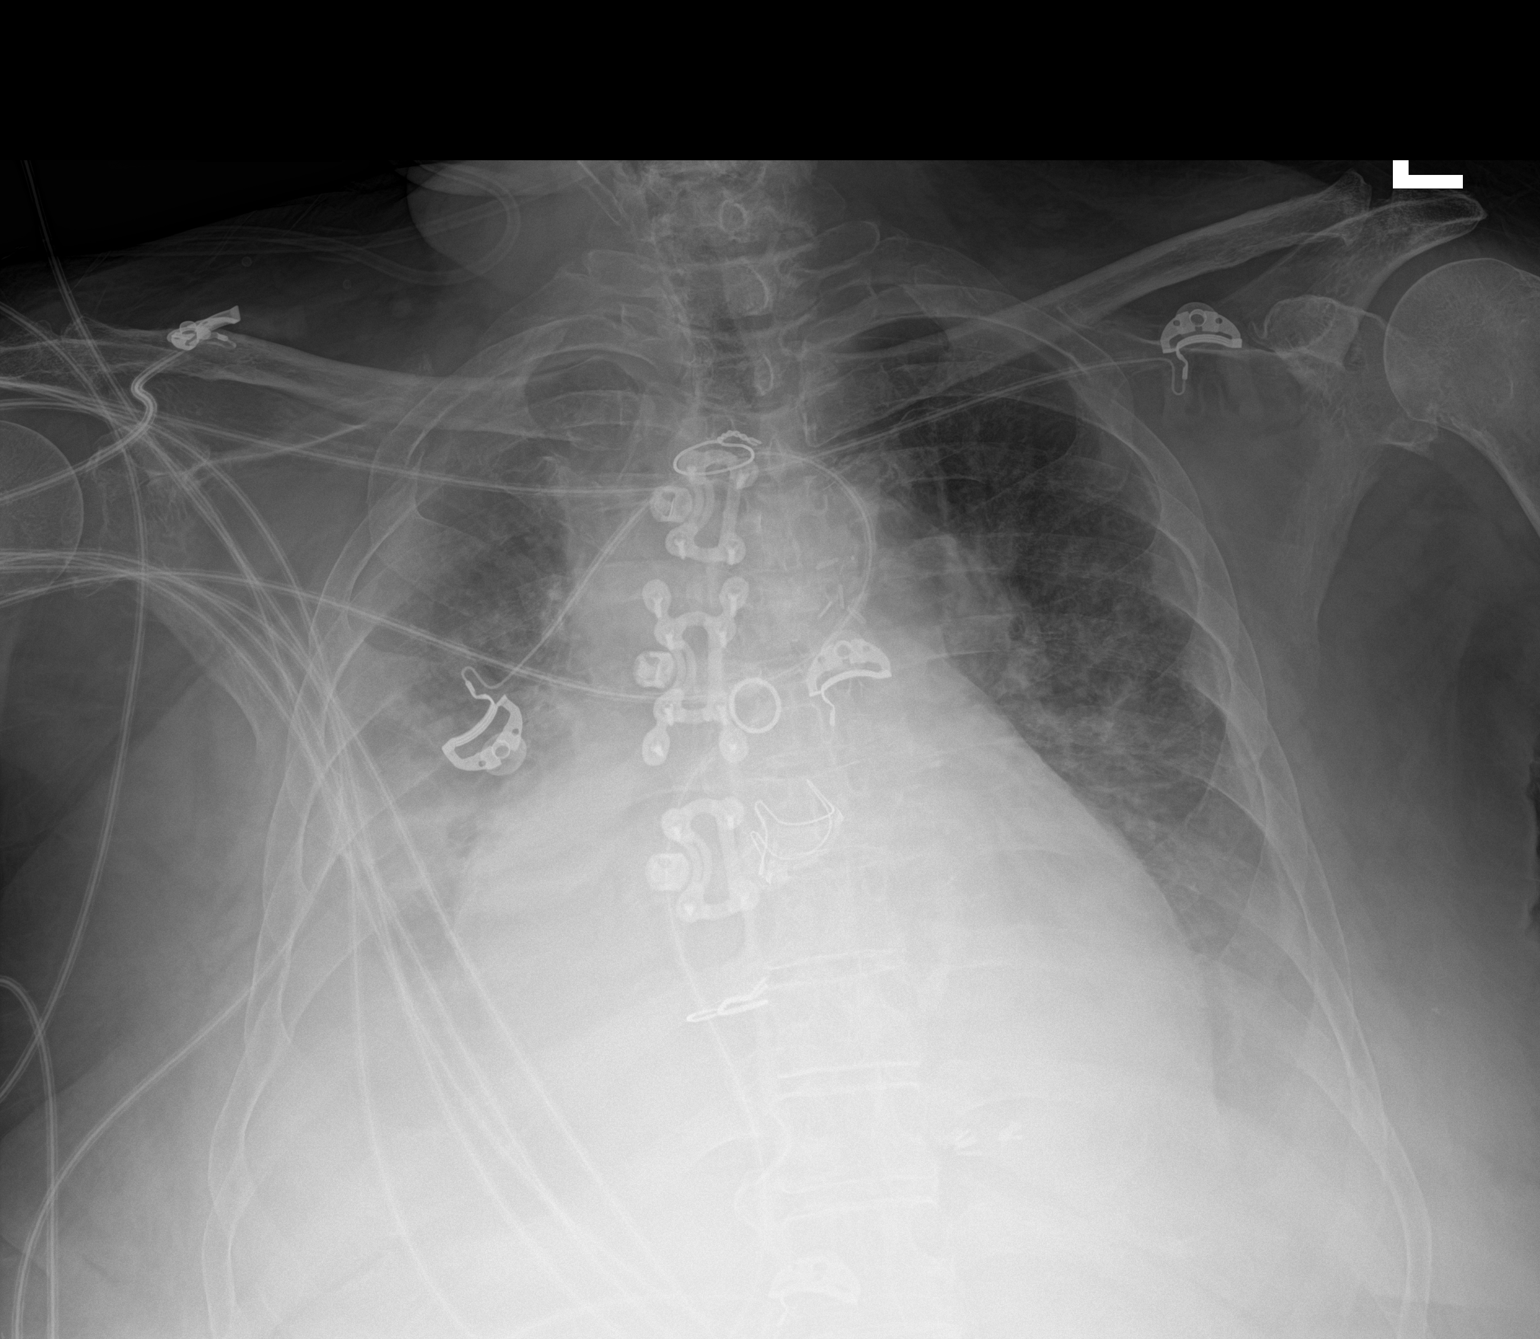

[1 of 1 positions shown; findings below may reference images not displayed]

FINDINGS: Again noted is cardiomegaly. There are moderate to large bilateral
pleural effusions which have increased in size from the prior study.
The patient is status post prior valve replacement and median
sternotomy. There is vascular congestion with findings consistent
with pulmonary edema. Bibasilar airspace disease is again noted.
IMPRESSION: Findings consistent with worsening congestive heart failure as
detailed above.

## 2020-09-13 IMAGING — CT CT HEAD W/O CM
1 of 2 series · 15 of 30 positions shown, 19 images · non-contrast
Comparison: Head CT dated 07/04/2019.

CLINICAL DATA: 63-year-old female with altered mental status.

EXAM:
CT HEAD WITHOUT CONTRAST
TECHNIQUE: Contiguous axial images were obtained from the base of the skull
through the vertex without intravenous contrast.

[Series 4: head 2.0 h70h · axial · 0.44mm/px · z∈[-131,+15]mm · 15 of 83 slices shown, 19 images]
[im 5/83  brain]
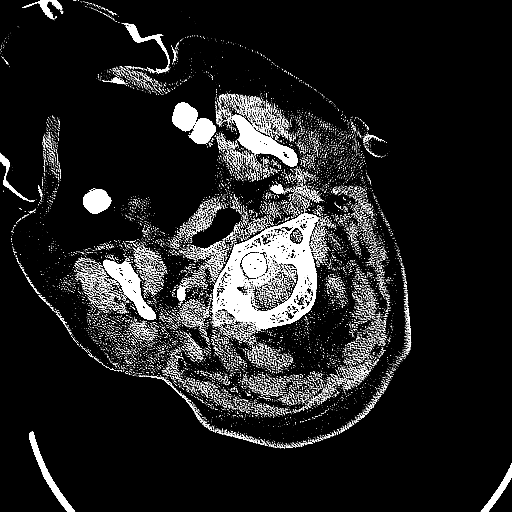
[im 5/83  bone]
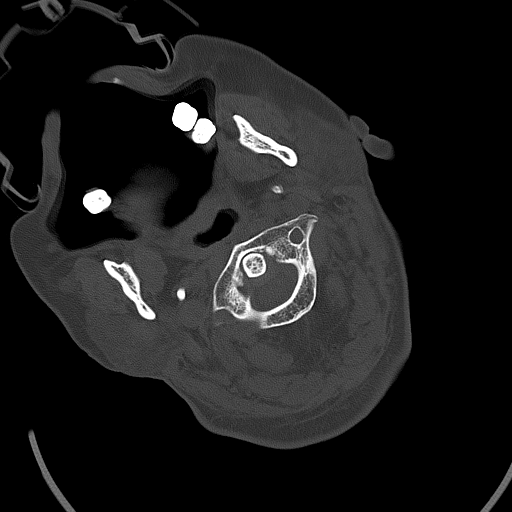
[im 9/83  brain]
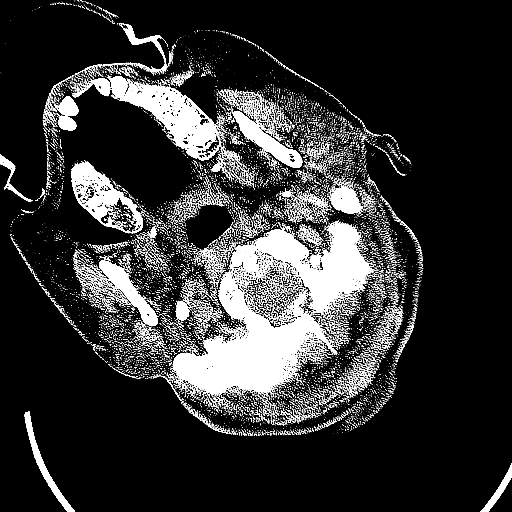
[im 17/83  brain]
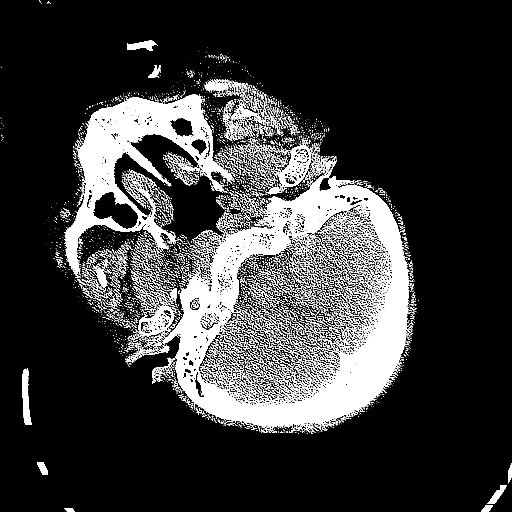
[im 21/83  brain]
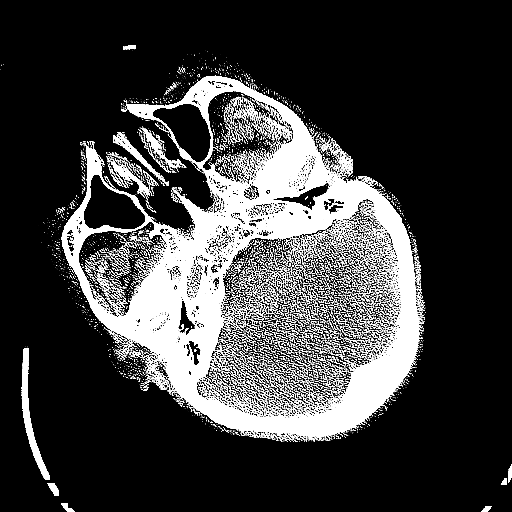
[im 25/83  brain]
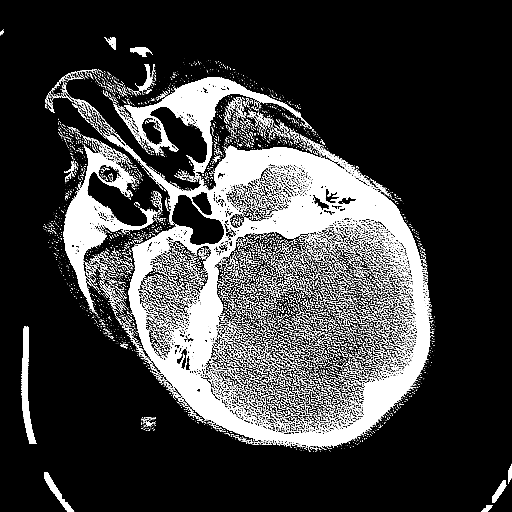
[im 25/83  bone]
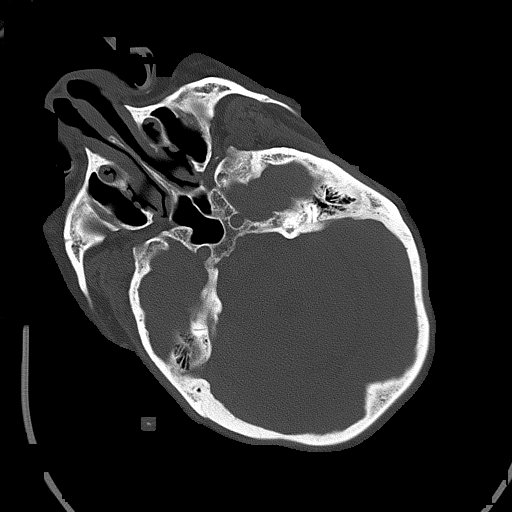
[im 29/83  brain]
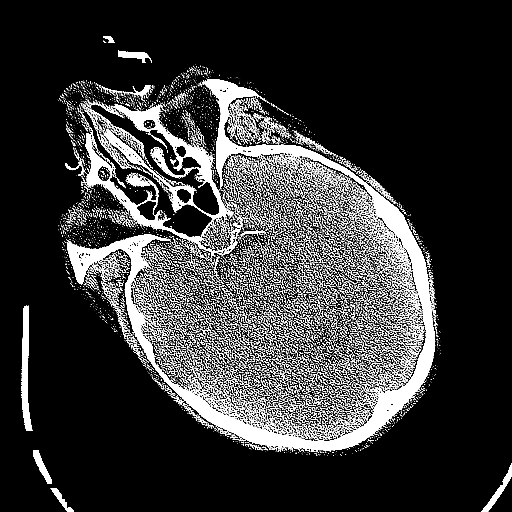
[im 37/83  brain]
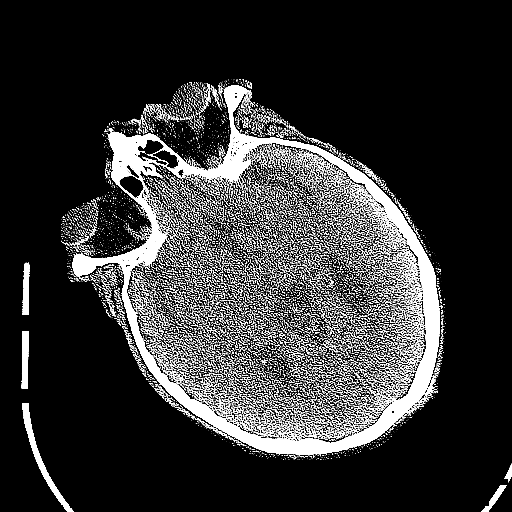
[im 42/83  brain]
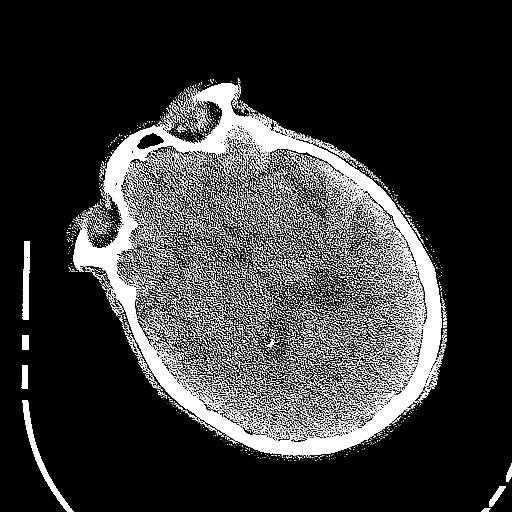
[im 46/83  brain]
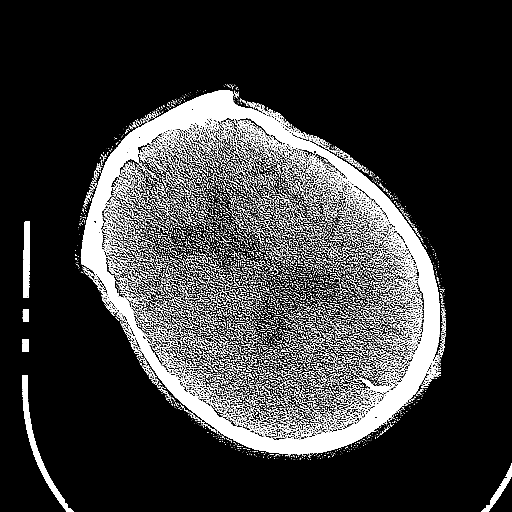
[im 46/83  bone]
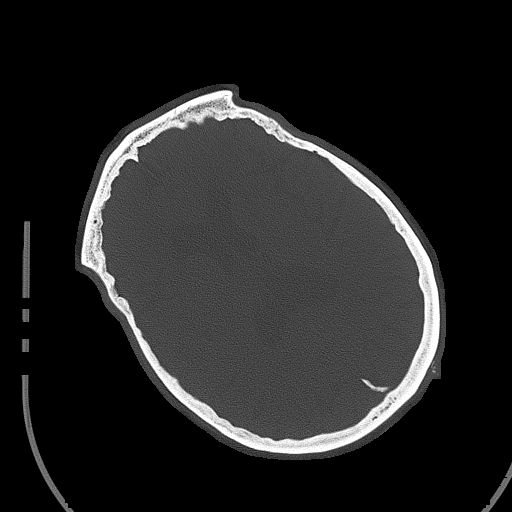
[im 54/83  brain]
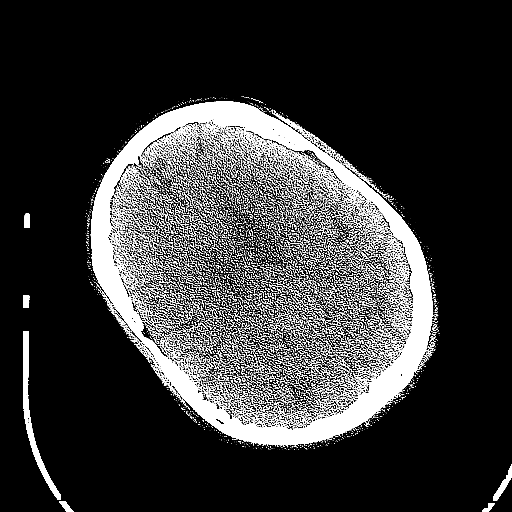
[im 58/83  brain]
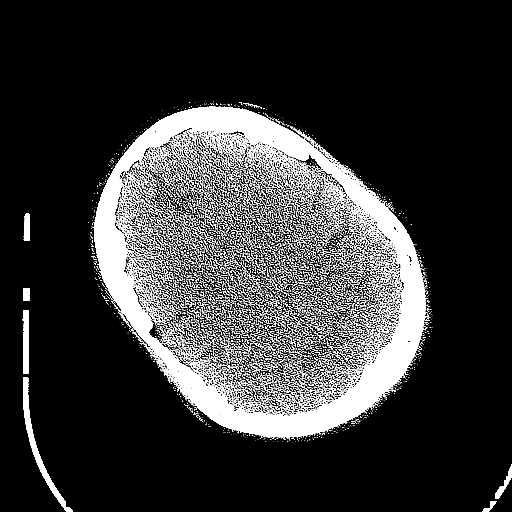
[im 62/83  brain]
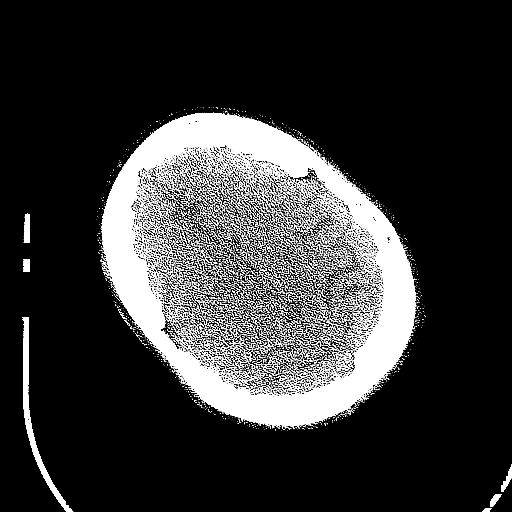
[im 66/83  brain]
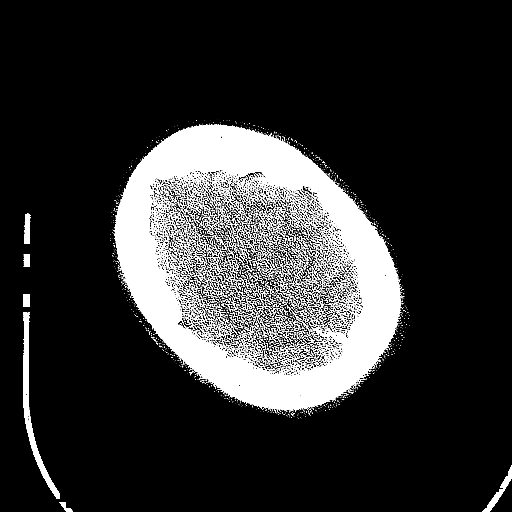
[im 66/83  bone]
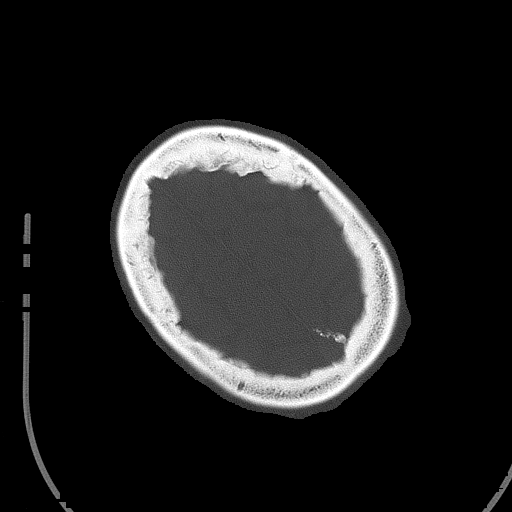
[im 74/83  brain]
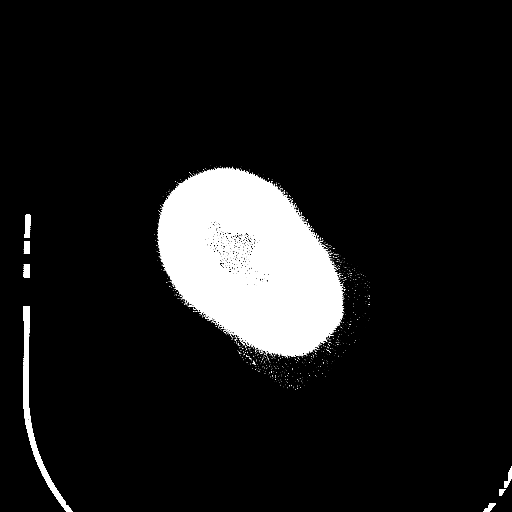
[im 78/83  brain]
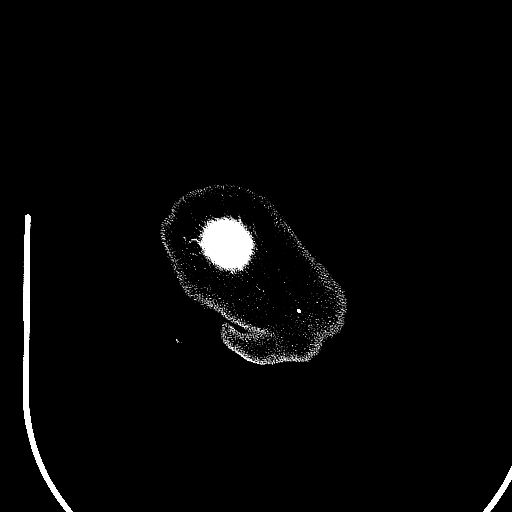

[15 of 30 positions shown; findings below may reference images not displayed]

FINDINGS: Brain: The ventricles and sulci appropriate size for patient's age.
Moderate periventricular and deep white matter chronic microvascular
ischemic changes. Old right basal ganglia infarct. There is no acute
intracranial hemorrhage. No mass effect or midline shift. No
extra-axial fluid collection.

Vascular: No hyperdense vessel or unexpected calcification.

Skull: Normal. Negative for fracture or focal lesion.

Sinuses/Orbits: The visualized paranasal sinuses and mastoid air
cells are clear. Mild bilateral exophthalmos.

Other: Old nasal bone fractures.
IMPRESSION: 1. No acute intracranial hemorrhage.
2. Moderate chronic microvascular ischemic changes. Old right basal
ganglia infarct.

## 2020-09-13 IMAGING — DX DG CHEST 1V PORT
1 series · 1 of 1 positions shown · non-contrast
Comparison: Radiograph 08/20/2019, CT 10/28/2015

CLINICAL DATA: ETT and NG placement

EXAM:
PORTABLE CHEST 1 VIEW

[chest]
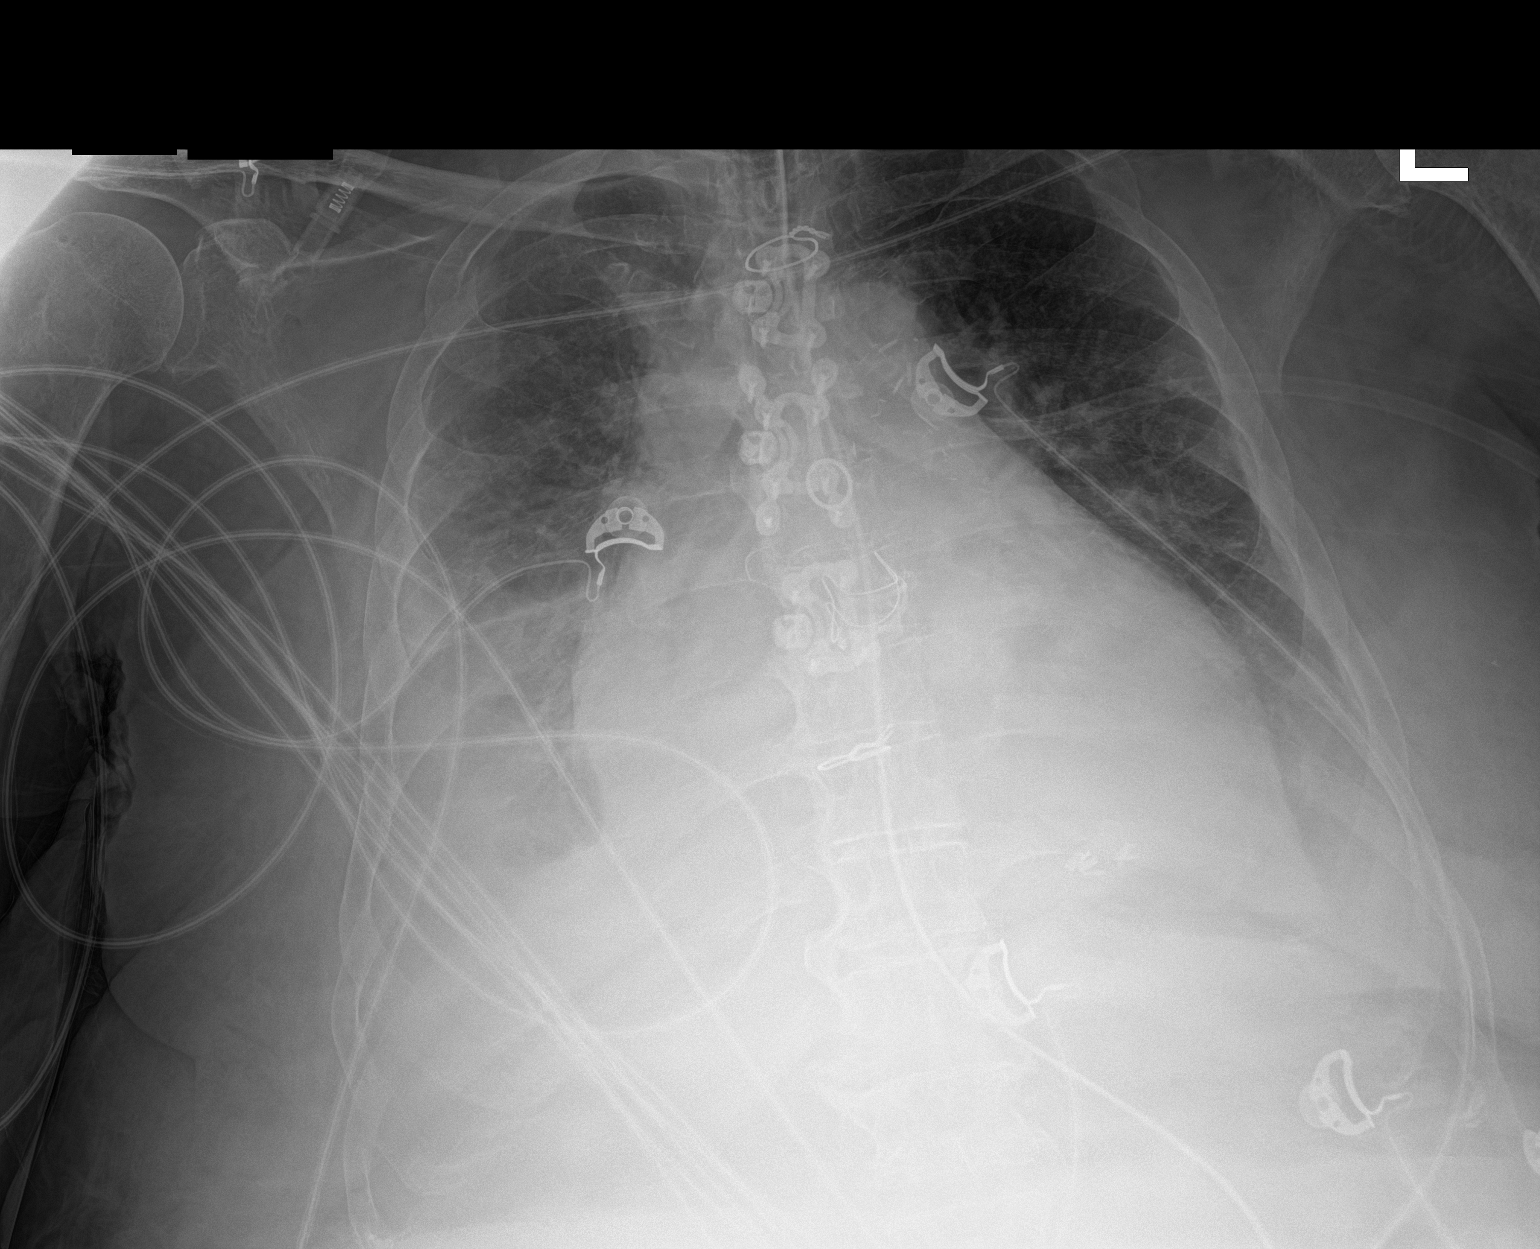

[1 of 1 positions shown; findings below may reference images not displayed]

FINDINGS: Endotracheal tube terminates in the mid trachea, approximately 5 cm
from the carina. Transesophageal tube tip terminates below the level
of imaging. Beyond the GE junction. Additional external support
devices overlie the chest. Evidence of prior sternotomy, CABG and
bioprosthetic aortic valve replacement. Sternal plate and screw
construct as well as a superior cerclage wire in similar alignment
from prior without acute complication.

Suspect bilateral effusions with obscuration of the hemidiaphragms
and gradient density towards the lung bases there is a background of
more diffuse hazy perihilar and lower lung interstitial opacity with
vascular congestion and cardiomegaly which is overall similar to
comparison No acute osseous or soft tissue abnormality.
IMPRESSION: 1. Endotracheal tube terminates in the mid trachea, approximately 5
cm from the carina.
2. Transesophageal tube tip terminates below the level of imaging,
tip beyond the GE junction.
3. Likely persistent bilateral effusions and basilar atelectasis.
4. Additional features of CHF.
